# Patient Record
Sex: Male | Born: 1975
Health system: Southern US, Community
[De-identification: ages and names within clinical notes are randomized; demographics above are authoritative.]

## PROBLEM LIST (undated history)

## (undated) DIAGNOSIS — I2510 Atherosclerotic heart disease of native coronary artery without angina pectoris: Secondary | ICD-10-CM

## (undated) DIAGNOSIS — I10 Essential (primary) hypertension: Secondary | ICD-10-CM

## (undated) DIAGNOSIS — F419 Anxiety disorder, unspecified: Secondary | ICD-10-CM

## (undated) DIAGNOSIS — F329 Major depressive disorder, single episode, unspecified: Secondary | ICD-10-CM

## (undated) DIAGNOSIS — E785 Hyperlipidemia, unspecified: Secondary | ICD-10-CM

## (undated) DIAGNOSIS — F32A Depression, unspecified: Secondary | ICD-10-CM

## (undated) DIAGNOSIS — E119 Type 2 diabetes mellitus without complications: Secondary | ICD-10-CM

## (undated) DIAGNOSIS — R011 Cardiac murmur, unspecified: Secondary | ICD-10-CM

## (undated) DIAGNOSIS — E782 Mixed hyperlipidemia: Secondary | ICD-10-CM

## (undated) DIAGNOSIS — I350 Nonrheumatic aortic (valve) stenosis: Secondary | ICD-10-CM

## (undated) DIAGNOSIS — E7800 Pure hypercholesterolemia, unspecified: Secondary | ICD-10-CM

## (undated) DIAGNOSIS — L03032 Cellulitis of left toe: Secondary | ICD-10-CM

## (undated) DIAGNOSIS — R0600 Dyspnea, unspecified: Secondary | ICD-10-CM

## (undated) HISTORY — DX: Mixed hyperlipidemia: E78.2

## (undated) HISTORY — DX: Type 2 diabetes mellitus without complications: E11.9

## (undated) HISTORY — DX: Cellulitis of left toe: L03.032

## (undated) HISTORY — DX: Hyperlipidemia, unspecified: E78.5

## (undated) HISTORY — DX: Anxiety disorder, unspecified: F41.9

## (undated) HISTORY — PX: CARDIAC VALVE REPLACEMENT: SHX585

## (undated) HISTORY — DX: Essential (primary) hypertension: I10

## (undated) HISTORY — DX: Depression, unspecified: F32.A

## (undated) HISTORY — DX: Major depressive disorder, single episode, unspecified: F32.9

---
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
## 2007-01-20 ENCOUNTER — Emergency Department (HOSPITAL_COMMUNITY): Admission: EM | Admit: 2007-01-20 | Discharge: 2007-01-20 | Payer: Self-pay | Admitting: Emergency Medicine

## 2011-05-26 ENCOUNTER — Other Ambulatory Visit: Payer: Self-pay

## 2011-05-26 ENCOUNTER — Emergency Department (HOSPITAL_COMMUNITY)
Admission: EM | Admit: 2011-05-26 | Discharge: 2011-05-26 | Disposition: A | Discharge status: Home / Self Care | Payer: Self-pay | Attending: Emergency Medicine | Admitting: Emergency Medicine

## 2011-05-26 ENCOUNTER — Emergency Department (HOSPITAL_COMMUNITY): Payer: Self-pay

## 2011-05-26 ENCOUNTER — Encounter (HOSPITAL_COMMUNITY): Payer: Self-pay | Admitting: *Deleted

## 2011-05-26 DIAGNOSIS — R12 Heartburn: Secondary | ICD-10-CM | POA: Insufficient documentation

## 2011-05-26 DIAGNOSIS — R109 Unspecified abdominal pain: Secondary | ICD-10-CM | POA: Insufficient documentation

## 2011-05-26 DIAGNOSIS — K920 Hematemesis: Secondary | ICD-10-CM | POA: Insufficient documentation

## 2011-05-26 DIAGNOSIS — M549 Dorsalgia, unspecified: Secondary | ICD-10-CM | POA: Insufficient documentation

## 2011-05-26 DIAGNOSIS — R739 Hyperglycemia, unspecified: Secondary | ICD-10-CM

## 2011-05-26 DIAGNOSIS — E119 Type 2 diabetes mellitus without complications: Secondary | ICD-10-CM | POA: Insufficient documentation

## 2011-05-26 LAB — CBC
Hemoglobin: 14.9 g/dL (ref 13.0–17.0)
MCH: 30 pg (ref 26.0–34.0)
MCHC: 34.5 g/dL (ref 30.0–36.0)
MCV: 86.9 fL (ref 78.0–100.0)
Platelets: 241 10*3/uL (ref 150–400)
RBC: 4.97 MIL/uL (ref 4.22–5.81)

## 2011-05-26 LAB — DIFFERENTIAL
Basophils Relative: 0 % (ref 0–1)
Eosinophils Absolute: 0 10*3/uL (ref 0.0–0.7)
Eosinophils Relative: 0 % (ref 0–5)
Lymphs Abs: 2.1 10*3/uL (ref 0.7–4.0)
Monocytes Relative: 5 % (ref 3–12)

## 2011-05-26 LAB — URINALYSIS, ROUTINE W REFLEX MICROSCOPIC
Ketones, ur: NEGATIVE mg/dL
Leukocytes, UA: NEGATIVE
Nitrite: NEGATIVE
Protein, ur: NEGATIVE mg/dL
Urobilinogen, UA: 0.2 mg/dL (ref 0.0–1.0)

## 2011-05-26 LAB — CARDIAC PANEL(CRET KIN+CKTOT+MB+TROPI): CK, MB: 2.4 ng/mL (ref 0.3–4.0)

## 2011-05-26 LAB — BASIC METABOLIC PANEL
BUN: 10 mg/dL (ref 6–23)
Calcium: 9.2 mg/dL (ref 8.4–10.5)
GFR calc Af Amer: >60 mL/min (ref >60)
GFR calc non Af Amer: >60 mL/min (ref >60)
Glucose, Bld: 234 mg/dL — ABNORMAL HIGH (ref 70–99)

## 2011-05-26 LAB — HEPATIC FUNCTION PANEL
Bilirubin, Direct: 0.1 mg/dL (ref 0.0–0.3)
Indirect Bilirubin: 0.4 mg/dL (ref 0.3–0.9)
Total Bilirubin: 0.5 mg/dL (ref 0.3–1.2)

## 2011-05-26 LAB — LIPASE, BLOOD: Lipase: 23 U/L (ref 11–59)

## 2011-05-26 MED ORDER — FAMOTIDINE 20 MG PO TABS: 20.0000 mg | ORAL_TABLET | Freq: Two times a day (BID) | ORAL | Status: DC

## 2011-05-26 MED ORDER — OMEPRAZOLE 20 MG PO CPDR: DELAYED_RELEASE_CAPSULE | ORAL | Status: DC

## 2011-05-26 MED ORDER — FAMOTIDINE 20 MG PO TABS
20.0000 mg | ORAL_TABLET | Freq: Once | ORAL | Status: AC
  Administered 2011-05-26: 20 mg via ORAL
  Filled 2011-05-26: qty 1

## 2011-05-26 MED ORDER — GI COCKTAIL ~~LOC~~
30.0000 mL | Freq: Once | ORAL | Status: AC
  Administered 2011-05-26: 30 mL via ORAL
  Filled 2011-05-26: qty 30

## 2011-05-26 MED ORDER — HYDROCODONE-ACETAMINOPHEN 5-325 MG PO TABS: 2.0000 | ORAL_TABLET | ORAL | Status: AC | PRN

## 2011-05-26 MED ORDER — SIMETHICONE 80 MG PO CHEW
CHEWABLE_TABLET | ORAL | Status: AC
  Administered 2011-05-26: 160 mg
  Filled 2011-05-26: qty 2

## 2011-05-26 NOTE — Progress Notes (Signed)
Pt states he only received minimal relief from the pepcid and prilosec. Gas X and narco Rx given. Pt ot follow up with his MD This week. He is to return to the [facility] if any emergent changes.

## 2011-05-26 NOTE — Progress Notes (Signed)
Test results reviewed with pt. Pt not aware of being diabetic. STRONGLY suggested he see his MD at [facility] practice this week for additional eval and treatment, and continued work up of the abdominal pain and gas.

## 2011-05-26 NOTE — ED Notes (Signed)
Pt states he was seen here today for same complaint and was sent home with meds for pain and nausea; pt states once he got home he began vomiting red emesis; pt states he is still having pain to left upper quad

## 2011-05-26 NOTE — ED Notes (Signed)
Pt upper left back pain and left upper abd/ under left breast.. States pain is dull pain. Denies sob, periods of nausea. Pain radiates to back as well.

## 2011-05-26 NOTE — ED Provider Notes (Signed)
History     CSN: [id number] Arrival date & time: 05/26/2011 11:27 AM  Chief Complaint  Patient presents with  . Abdominal Pain  . Back Pain   HPI Comments: Pt states he was awaken about 3AM with a dull ache that is in the left upper abd and back area. It is not associated with SOB. No reported sweats. No recent cough/sneeze. No lifting, pushing or pulling. No changes in diet. This pain was accompanied by a bloated feeling. He took Maalox without relief. No previous hx of this type of pain.  Patient is a 35 y.o. male presenting with abdominal pain and back pain. The history is provided by the patient.  Abdominal Pain The primary symptoms of the illness include abdominal pain. The primary symptoms of the illness do not include shortness of breath or dysuria. The current episode started 13 to 24 hours ago. The onset of the illness was sudden. The problem has not changed since onset. The illness is associated with awakening from sleep. Additional symptoms associated with the illness include heartburn and back pain. Symptoms associated with the illness do not include chills, constipation, urgency, hematuria or frequency. Significant associated medical issues include gallstones. Significant associated medical issues do not include PUD, GERD, inflammatory bowel disease, diabetes, liver disease, substance abuse, HIV or cardiac disease.  Back Pain  Associated symptoms include abdominal pain. Pertinent negatives include no chest pain and no dysuria.    History reviewed. No pertinent past medical history.  History reviewed. No pertinent past surgical history.  No family history on file.  History  Substance Use Topics  . Smoking status: Never Smoker   . Smokeless tobacco: Not on file  . Alcohol Use: Yes      Review of Systems  Constitutional: Negative for chills and activity change.       All ROS Neg except as noted in HPI  HENT: Negative for nosebleeds and neck pain.   Eyes: Negative for  photophobia and discharge.  Respiratory: Negative for cough, shortness of breath and wheezing.   Cardiovascular: Negative for chest pain and palpitations.  Gastrointestinal: Positive for heartburn and abdominal pain. Negative for constipation and blood in stool.  Genitourinary: Negative for dysuria, urgency, frequency and hematuria.  Musculoskeletal: Positive for back pain. Negative for arthralgias.  Skin: Negative.   Neurological: Negative for dizziness, seizures and speech difficulty.  Psychiatric/Behavioral: Negative for hallucinations and confusion.    Physical Exam  BP 167/91  Pulse 82  Temp(Src) 98.4 F (36.9 C) (Oral)  Resp 22  Ht 6' (1.829 m)  Wt 419 lb (190.057 kg)  BMI 56.83 kg/m2  SpO2 99%  Physical Exam  Nursing note and vitals reviewed. Constitutional: He is oriented to person, place, and time. He appears well-developed and well-nourished.  Non-toxic appearance.  HENT:  Head: Normocephalic.  Right Ear: Tympanic membrane and external ear normal.  Left Ear: Tympanic membrane and external ear normal.  Eyes: EOM and lids are normal. Pupils are equal, round, and reactive to light.  Neck: Normal range of motion. Neck supple. Carotid bruit is not present.  Cardiovascular: Normal rate, regular rhythm, normal heart sounds, intact distal pulses and normal pulses.   Pulmonary/Chest: Breath sounds normal. No respiratory distress.  Abdominal: Soft. He exhibits no mass. There is tenderness. There is no rebound and no guarding.       Left upper quad tenderness to palpation. Gaseous bowel sounds throughout the abd. No palpable mass.  Musculoskeletal: Normal range of motion. He exhibits no tenderness.  Lymphadenopathy:       Head (right side): No submandibular adenopathy present.       Head (left side): No submandibular adenopathy present.    He has no cervical adenopathy.  Neurological: He is alert and oriented to person, place, and time. He has normal strength. No cranial nerve  deficit or sensory deficit.  Skin: Skin is warm and dry.  Psychiatric: He has a normal mood and affect. His speech is normal.    ED Course  Procedures  MDM I have reviewed nursing notes, vital signs, and all appropriate lab and imaging results for this patient. If cardiac markers and EKG neg for acute problem suspect diabetic gastroparesis.     Date: 05/26/2011  Rate: 79  Rhythm: normal sinus rhythm  QRS Axis: normal  Intervals: normal  ST/T Wave abnormalities: normal  Conduction Disutrbances:none  Narrative Interpretation:   Old EKG Reviewed: none available   Kathie Dike, PA 05/26/11 1500  Kathie Dike, [place] 05/26/11 1500

## 2011-05-27 ENCOUNTER — Ambulatory Visit (HOSPITAL_COMMUNITY): Admission: RE | Admit: 2011-05-27 | Payer: Self-pay | Source: Ambulatory Visit

## 2011-05-27 ENCOUNTER — Emergency Department (HOSPITAL_COMMUNITY)
Admission: EM | Admit: 2011-05-27 | Discharge: 2011-05-27 | Disposition: A | Discharge status: Home / Self Care | Payer: Self-pay | Attending: Emergency Medicine | Admitting: Emergency Medicine

## 2011-05-27 MED ORDER — HYDROMORPHONE HCL 1 MG/ML IJ SOLN
1.0000 mg | Freq: Once | INTRAMUSCULAR | Status: AC
  Administered 2011-05-27: 1 mg via INTRAMUSCULAR
  Filled 2011-05-27: qty 1

## 2011-05-27 MED ORDER — ONDANSETRON HCL 4 MG/2ML IJ SOLN
4.0000 mg | Freq: Once | INTRAMUSCULAR | Status: DC
  Filled 2011-05-27: qty 2

## 2011-05-27 MED ORDER — PROMETHAZINE HCL 25 MG PO TABS: 25.0000 mg | ORAL_TABLET | Freq: Four times a day (QID) | ORAL | Status: AC | PRN

## 2011-05-27 MED ORDER — SODIUM CHLORIDE 0.9 % IV SOLN: Freq: Once | INTRAVENOUS | Status: DC

## 2011-05-27 MED ORDER — HYDROCODONE-ACETAMINOPHEN 5-325 MG PO TABS: 2.0000 | ORAL_TABLET | ORAL | Status: AC | PRN

## 2011-05-27 NOTE — Progress Notes (Signed)
  Medical screening examination/treatment/procedure(s) were performed by non-physician practitioner and as supervising physician I was immediately available for consultation/collaboration.     

## 2011-05-27 NOTE — ED Notes (Signed)
Pt advised edp will be in to see him in the next little bit. No signs of distress noted. No active vomiting at this time,

## 2011-05-27 NOTE — ED Notes (Signed)
Ct called for wt limit on scanner. EDP notified pt is over wt limit for scan. EDP advised she would have to go back & see pt.

## 2011-05-27 NOTE — ED Notes (Signed)
Pt complaining of back pain & epigastric pain. Was seen in the ed yesterday. No relief from meds. Pt also states vomited blood around 1900 yesterday.

## 2011-05-27 NOTE — ED Notes (Signed)
Pt advised tech that his stomach & back were really hurting. EDP notified.

## 2011-05-27 NOTE — ED Provider Notes (Signed)
Medical screening examination/treatment/procedure(s) were performed by non-physician practitioner and as supervising physician I was immediately available for consultation/collaboration.   Vida Roller, MD 05/27/11 [phone]

## 2011-07-27 NOTE — ED Provider Notes (Signed)
History     CSN: [id number] Arrival date & time: 05/27/2011 12:43 AM   None     Chief Complaint  Patient presents with  . Abdominal Pain  . Hematemesis    (Consider location/radiation/quality/duration/timing/severity/associated sxs/prior treatment) HPI Comments: Patient has been seen previously for abdominal pain, vomiting, and nausea. Continued to have nausea and abdominal pain after going home. Patient is morbidly obese. States emesis had a red color to it . Medications given him yesterday did not help with nausea or with pain.   Patient is a 35 y.o. male presenting with abdominal pain.  Abdominal Pain The primary symptoms of the illness include abdominal pain, nausea and vomiting.    History reviewed. No pertinent past medical history.  History reviewed. No pertinent past surgical history.  History reviewed. No pertinent family history.  History  Substance Use Topics  . Smoking status: Never Smoker   . Smokeless tobacco: Not on file  . Alcohol Use: Yes      Review of Systems  Gastrointestinal: Positive for nausea, vomiting and abdominal pain.  All other systems reviewed and are negative.    Allergies  Review of patient's allergies indicates no known allergies.  Home Medications   Current Outpatient Rx  Name Route Sig Dispense Refill  . FAMOTIDINE 20 MG PO TABS Oral Take 1 tablet (20 mg total) by mouth 2 (two) times daily. 40 tablet 0  . IBUPROFEN 200 MG PO TABS Oral Take 800 mg by mouth every 6 (six) hours as needed. Back Pain     . OMEPRAZOLE 20 MG PO CPDR  2 po daily 40 capsule 0    BP 141/87  Pulse 100  Temp(Src) 98.2 F (36.8 C) (Oral)  Resp 20  Ht 6' (1.829 m)  Wt 419 lb (190.057 kg)  BMI 56.83 kg/m2  SpO2 100%  Physical Exam  Nursing note and vitals reviewed. Constitutional: He is oriented to person, place, and time. He appears well-developed and well-nourished. No distress.       Morbidly obese  HENT:  Head: Normocephalic and atraumatic.   Mouth/Throat: Oropharynx is clear and moist.  Eyes: EOM are normal.  Neck: Normal range of motion. Neck supple.  Cardiovascular: Normal rate, normal heart sounds and intact distal pulses.   Pulmonary/Chest: Effort normal and breath sounds normal.  Abdominal: Soft. He exhibits no mass. There is no tenderness. There is no rebound and no guarding.       obese  Musculoskeletal: Normal range of motion.       Chronic skin changes to lower extremities. 3+ brawny edema.  Neurological: He is alert and oriented to person, place, and time.  Skin: Skin is warm and dry.    ED Course  Procedures (including critical care time)  Labs Reviewed - No data to display No results found.   No diagnosis found.    MDM  Moridly obese man here with continued abdominal pain, nausea and vomiting. Unable to obtain CT for further evaluation due to weight limitation.Given antiemetic and narcotic with pain relief. No vomiting while in the ER.        Nicoletta Dress. Colon Branch, MD 07/27/11 [phone]

## 2011-08-31 ENCOUNTER — Emergency Department (HOSPITAL_COMMUNITY)
Admission: EM | Admit: 2011-08-31 | Discharge: 2011-08-31 | Disposition: A | Discharge status: Home / Self Care | Payer: Self-pay | Attending: Emergency Medicine | Admitting: Emergency Medicine

## 2011-08-31 ENCOUNTER — Encounter (HOSPITAL_COMMUNITY): Payer: Self-pay | Admitting: *Deleted

## 2011-08-31 DIAGNOSIS — R739 Hyperglycemia, unspecified: Secondary | ICD-10-CM

## 2011-08-31 DIAGNOSIS — R7309 Other abnormal glucose: Secondary | ICD-10-CM | POA: Insufficient documentation

## 2011-08-31 DIAGNOSIS — Z6841 Body Mass Index (BMI) 40.0 and over, adult: Secondary | ICD-10-CM | POA: Insufficient documentation

## 2011-08-31 HISTORY — DX: Morbid (severe) obesity due to excess calories: E66.01

## 2011-08-31 LAB — URINALYSIS, ROUTINE W REFLEX MICROSCOPIC
Bilirubin Urine: NEGATIVE
Ketones, ur: NEGATIVE mg/dL
Leukocytes, UA: NEGATIVE
Nitrite: NEGATIVE
Protein, ur: NEGATIVE mg/dL
Urobilinogen, UA: 0.2 mg/dL (ref 0.0–1.0)

## 2011-08-31 LAB — POCT I-STAT, CHEM 8
Creatinine, Ser: 0.7 mg/dL (ref 0.50–1.35)
HCT: 53 % — ABNORMAL HIGH (ref 39.0–52.0)
Hemoglobin: 18 g/dL — ABNORMAL HIGH (ref 13.0–17.0)
Potassium: 3.7 mEq/L (ref 3.5–5.1)
Sodium: 133 mEq/L — ABNORMAL LOW (ref 135–145)
TCO2: 26 mmol/L (ref 0–100)

## 2011-08-31 LAB — GLUCOSE, CAPILLARY: Glucose-Capillary: 352 mg/dL — ABNORMAL HIGH (ref 70–99)

## 2011-08-31 LAB — BLOOD GAS, ARTERIAL
Acid-Base Excess: 1 mmol/L (ref 0.0–2.0)
Drawn by: 22874
FIO2: 0.21 %
pCO2 arterial: 40 mmHg (ref 35.0–45.0)
pH, Arterial: 7.414 (ref 7.350–7.450)
pO2, Arterial: 76.3 mmHg — ABNORMAL LOW (ref 80.0–100.0)

## 2011-08-31 LAB — URINE MICROSCOPIC-ADD ON

## 2011-08-31 MED ORDER — SODIUM CHLORIDE 0.9 % IV SOLN: INTRAVENOUS | Status: DC

## 2011-08-31 MED ORDER — SODIUM CHLORIDE 0.9 % IV BOLUS (SEPSIS)
1000.0000 mL | Freq: Once | INTRAVENOUS | Status: AC
  Administered 2011-08-31: 1000 mL via INTRAVENOUS

## 2011-08-31 MED ORDER — METFORMIN HCL 500 MG PO TABS: 500.0000 mg | ORAL_TABLET | Freq: Two times a day (BID) | ORAL | Status: DC

## 2011-08-31 MED ORDER — METFORMIN HCL 500 MG PO TABS
500.0000 mg | ORAL_TABLET | Freq: Once | ORAL | Status: AC
  Administered 2011-08-31: 500 mg via ORAL
  Filled 2011-08-31: qty 1

## 2011-08-31 MED ORDER — SODIUM CHLORIDE 0.9 % IV BOLUS (SEPSIS)
500.0000 mL | Freq: Once | INTRAVENOUS | Status: AC
  Administered 2011-08-31: 500 mL via INTRAVENOUS

## 2011-08-31 NOTE — ED Provider Notes (Signed)
History     CSN: [id number] Arrival date & time: 08/31/2011  2:58 PM   Chief Complaint  Patient presents with  . Dizziness   HPI Pt was seen at 1620.  Per pt, c/o gradual onset and persistence of constant increased thirst and urination, generalized weakness and fatigue x1-2 months, worse over the past several days.  States "my mother's side of the family all has DM" but he has not checked his CBG.  Pt concerned re: possible DM.  Denies CP/SOB, no abd pain, no N/V/D, no fevers, no back pain.    Past Medical History  Diagnosis Date  . Morbid obesity   . Cholelithiasis     History reviewed. No pertinent past surgical history.   History  Substance Use Topics  . Smoking status: Never Smoker   . Smokeless tobacco: Not on file  . Alcohol Use: Yes     occasionally    Review of Systems ROS: Statement: All systems negative except as marked or noted in the HPI; Constitutional: Negative for fever and chills. ; ; Eyes: Negative for eye pain, redness and discharge. ; ; ENMT: Negative for ear pain, hoarseness, nasal congestion, sinus pressure and sore throat. ; ; Cardiovascular: Negative for chest pain, palpitations, diaphoresis, dyspnea and peripheral edema. ; ; Respiratory: Negative for cough, wheezing and stridor. ; ; Gastrointestinal: +increased thrist and urination.  Negative for nausea, vomiting, diarrhea, abdominal pain, blood in stool, hematemesis, jaundice and rectal bleeding. . ; ; Genitourinary: Negative for dysuria, flank pain and hematuria. ; ; Musculoskeletal: Negative for back pain and neck pain. Negative for swelling and trauma.; ; Skin: Negative for pruritus, rash, abrasions, blisters, bruising and skin lesion.; ; Neuro: +generalized weakness and fatigue.  Negative for headache, lightheadedness and neck stiffness. Negative for altered level of consciousness , altered mental status, extremity weakness, paresthesias, involuntary movement, seizure and syncope.     Allergies  Review  of patient's allergies indicates no known allergies.  Home Medications   Current Outpatient Rx  Name Route Sig Dispense Refill  . IBUPROFEN 200 MG PO TABS Oral Take 600 mg by mouth every 6 (six) hours as needed. Back Pain      BP 143/87  Pulse 109  Temp(Src) 98.4 F (36.9 C) (Oral)  Resp 24  Ht 6' (1.829 m)  Wt 419 lb (190.057 kg)  BMI 56.83 kg/m2  SpO2 97%  Physical Exam 1625: Physical examination:  Nursing notes reviewed; Vital signs and O2 SAT reviewed;  Constitutional: Well developed, Well nourished, Well hydrated, In no acute distress; Head:  Normocephalic, atraumatic; Eyes: EOMI, PERRL, No scleral icterus; ENMT: Mouth and pharynx normal, Mucous membranes moist; Neck: Supple, Full range of motion, No lymphadenopathy; Cardiovascular: Regular rate and rhythm, No murmur, rub, or gallop; Respiratory: Breath sounds clear & equal bilaterally, No rales, rhonchi, wheezes, or rub, Normal respiratory effort/excursion; Chest: Nontender, Movement normal; Abdomen: Soft, Nontender, Nondistended, Normal bowel sounds; Extremities: Pulses normal, No tenderness, No edema, No calf edema or asymmetry.; Neuro: AA&Ox3, Major CN grossly intact. Speech clear, no facial droop. No gross focal motor or sensory deficits in extremities.; Skin: Color normal, Warm, Dry, no rash.    ED Course  Procedures    MDM  MDM Reviewed: nursing note and vitals Interpretation: labs   Results for orders placed during the hospital encounter of 08/31/11  GLUCOSE, CAPILLARY      Component Value Range   Glucose-Capillary 352 (*) 70 - 99 (mg/dL)   Comment 1 Notify RN  URINALYSIS, ROUTINE W REFLEX MICROSCOPIC      Component Value Range   Color, Urine YELLOW  YELLOW    APPearance CLEAR  CLEAR    Specific Gravity, Urine 1.010  1.005 - 1.030    pH 6.0  5.0 - 8.0    Glucose, UA >1000 (*) NEGATIVE (mg/dL)   Hgb urine dipstick NEGATIVE  NEGATIVE    Bilirubin Urine NEGATIVE  NEGATIVE    Ketones, ur NEGATIVE  NEGATIVE  (mg/dL)   Protein, ur NEGATIVE  NEGATIVE (mg/dL)   Urobilinogen, UA 0.2  0.0 - 1.0 (mg/dL)   Nitrite NEGATIVE  NEGATIVE    Leukocytes, UA NEGATIVE  NEGATIVE   BLOOD GAS, ARTERIAL      Component Value Range   FIO2 .21     Delivery systems ROOM AIR     pH, Arterial 7.414  7.350 - 7.450    pCO2 arterial 40.0  35.0 - 45.0 (mmHg)   pO2, Arterial 76.3 (*) 80.0 - 100.0 (mmHg)   Bicarbonate 25.1 (*) 20.0 - 24.0 (mEq/L)   TCO2 21.5  0 - 100 (mmol/L)   Acid-Base Excess 1.0  0.0 - 2.0 (mmol/L)   O2 Saturation 95.7     Patient temperature 37.0     Collection site LEFT BRACHIAL     Drawn by [id number]     Sample type ARTERIAL     Allens test (pass/fail) PASS  PASS   POCT I-STAT, CHEM 8      Component Value Range   Sodium 133 (*) 135 - 145 (mEq/L)   Potassium 3.7  3.5 - 5.1 (mEq/L)   Chloride 96  96 - 112 (mEq/L)   BUN 10  6 - 23 (mg/dL)   Creatinine, Ser 1.61  0.50 - 1.35 (mg/dL)   Glucose, Bld 096 (*) 70 - 99 (mg/dL)   Calcium, Ion 0.45  4.09 - 1.32 (mmol/L)   TCO2 26  0 - 100 (mmol/L)   Hemoglobin 18.0 (*) 13.0 - 17.0 (g/dL)   HCT 81.1 (*) 91.4 - 52.0 (%)  URINE MICROSCOPIC-ADD ON      Component Value Range   Squamous Epithelial / LPF RARE  RARE    WBC, UA 0-2  <3 (WBC/hpf)   RBC / HPF 0-2  <3 (RBC/hpf)   Bacteria, UA FEW (*) RARE   GLUCOSE, CAPILLARY      Component Value Range   Glucose-Capillary 295 (*) 70 - 99 (mg/dL)     7829:  Pt obese/Pickwickian, likely cause for lower PO2 (chronic).  Sats 96 -100% R/A, no resp distress.  Denies SOB, no fevers.  Doubt PE, CHF, or pneumonia at this time.  Glu elevated, but not acidotic.  Will give IVF and re-check CBG.  ED RN has spoken with Case Manager and states she will f/u with pt tomorrow re: DM education and obtaining PMD f/u.  Pt aware and agreeable.   7:28 PM:  CBG now 295 after IVF.  Will give dose of metformin while in ED.  Case Manager to contact pt tomorrow.  Pt wants to go home now, feels better.  VS remain stable.  Dx testing d/w  pt.  Questions answered.  Verb understanding, agreeable to d/c home with outpt f/u.      Adrielle Polakowski Allison Quarry, DO 09/02/11 1324

## 2011-08-31 NOTE — ED Notes (Signed)
Per Dr. Clarene Duke- cancel hgb A1C

## 2011-08-31 NOTE — ED Notes (Signed)
IV to Rt FA dc'd catheter intact.

## 2011-08-31 NOTE — ED Notes (Signed)
Spoke with Gonzella Lex case management will contact pt tomorrow to set up appt with health department

## 2011-08-31 NOTE — ED Notes (Addendum)
C/o dizziness, fatigue x 2 days; also c/o increased thirst and increased urination x 1 month.

## 2011-08-31 NOTE — ED Notes (Signed)
resp paged for ABG. Spoke with Britta Mccreedy

## 2011-09-01 LAB — URINE CULTURE

## 2011-09-01 NOTE — Progress Notes (Signed)
CARE MANAGEMENT NOTE 09/01/2011  Patient:  Adam Long, Adam Long   Account Number:  [id number]  Date Initiated:  09/01/2011  Documentation initiated by:  Anibal Henderson  Subjective/Objective Assessment:   ED patient--Pt found to be new onset DM in ED on 08/31/11. He is laid off from job, and is no longer receiving unemployment, so cannot afford MD visits     Action/Plan:   Spoke with pt and he is good with  going to [facility]. Appt made for 09/06/11 0900.  Message left on his cell phone with date, time and  their instructions   Anticipated DC Date:  08/31/2011   Anticipated DC Plan:  HOME/SELF CARE         Choice offered to / List presented to:             Status of service:   Medicare Important Message given?   (If response is "NO", the following Medicare IM given date fields will be blank) Date Medicare IM given:   Date Additional Medicare IM given:    Discharge Disposition:  HOME/SELF CARE  Per UR Regulation:    Comments:  Anibal Henderson RN 1200 09/01/11

## 2011-09-01 NOTE — Progress Notes (Signed)
Spoke with Adam Long by phone and he agrees with an appointment at [facility]. Appointment made for Mon 09/06/11 at 0900. Message left for him with this date and time, and their instructions

## 2011-11-17 ENCOUNTER — Encounter (HOSPITAL_COMMUNITY): Payer: Self-pay | Admitting: *Deleted

## 2011-11-17 ENCOUNTER — Emergency Department (HOSPITAL_COMMUNITY): Payer: Self-pay

## 2011-11-17 ENCOUNTER — Emergency Department (HOSPITAL_COMMUNITY)
Admission: EM | Admit: 2011-11-17 | Discharge: 2011-11-17 | Disposition: A | Discharge status: Home / Self Care | Payer: Self-pay | Attending: Emergency Medicine | Admitting: Emergency Medicine

## 2011-11-17 ENCOUNTER — Other Ambulatory Visit: Payer: Self-pay

## 2011-11-17 DIAGNOSIS — R197 Diarrhea, unspecified: Secondary | ICD-10-CM | POA: Insufficient documentation

## 2011-11-17 DIAGNOSIS — I10 Essential (primary) hypertension: Secondary | ICD-10-CM | POA: Insufficient documentation

## 2011-11-17 DIAGNOSIS — R079 Chest pain, unspecified: Secondary | ICD-10-CM | POA: Insufficient documentation

## 2011-11-17 DIAGNOSIS — Z6841 Body Mass Index (BMI) 40.0 and over, adult: Secondary | ICD-10-CM | POA: Insufficient documentation

## 2011-11-17 DIAGNOSIS — R51 Headache: Secondary | ICD-10-CM | POA: Insufficient documentation

## 2011-11-17 DIAGNOSIS — E119 Type 2 diabetes mellitus without complications: Secondary | ICD-10-CM | POA: Insufficient documentation

## 2011-11-17 DIAGNOSIS — R110 Nausea: Secondary | ICD-10-CM | POA: Insufficient documentation

## 2011-11-17 HISTORY — DX: Essential (primary) hypertension: I10

## 2011-11-17 HISTORY — DX: Pure hypercholesterolemia, unspecified: E78.00

## 2011-11-17 LAB — BASIC METABOLIC PANEL
BUN: 11 mg/dL (ref 6–23)
CO2: 25 mEq/L (ref 19–32)
Calcium: 9.9 mg/dL (ref 8.4–10.5)
Chloride: 100 mEq/L (ref 96–112)
Creatinine, Ser: 0.73 mg/dL (ref 0.50–1.35)

## 2011-11-17 LAB — CBC
HCT: 44.7 % (ref 39.0–52.0)
MCH: 28.7 pg (ref 26.0–34.0)
MCV: 85.6 fL (ref 78.0–100.0)
RBC: 5.22 MIL/uL (ref 4.22–5.81)
RDW: 13.1 % (ref 11.5–15.5)
WBC: 10.9 10*3/uL — ABNORMAL HIGH (ref 4.0–10.5)

## 2011-11-17 MED ORDER — ASPIRIN EC 81 MG PO TBEC: 81.0000 mg | DELAYED_RELEASE_TABLET | Freq: Every day | ORAL | Status: AC

## 2011-11-17 NOTE — ED Provider Notes (Signed)
History   This chart was scribed for Adam Jakes, MD by Clarita Crane. The patient was seen in room APA09/APA09. Patient's care was started at 1144.    CSN: [id number]  Arrival date & time 11/17/11  1144   First MD Initiated Contact with Patient 11/17/11 1154      Chief Complaint  Patient presents with  . Chest Pain    (Consider location/radiation/quality/duration/timing/severity/associated sxs/prior treatment) HPI Adam Long is a 36 y.o. male who presents to the Emergency Department complaining of intermittent chest pain described as sharp and localized to left side of chest onset 3-4 days ago and persistent since with associated nausea, diarrhea and HA. Patient notes episodes of chest pain last 2-3 minutes at a time and then resolved on own. Patient notes experiencing chest pain previously but notes current episodes of chest pain are more frequent and radiate to upper extremity. Denies chest pain currently and associated SOB, vomiting, syncope, abdominal pain, sore throat, nasal congestion, dysuria, swelling of lower extremities, rash. Patient with h/o diabetes, HTN, high cholesterol.  Past Medical History  Diagnosis Date  . Morbid obesity   . Cholelithiasis   . Diabetes mellitus   . Hypertension   . Hypercholesteremia     History reviewed. No pertinent past surgical history.  No family history on file.  History  Substance Use Topics  . Smoking status: Never Smoker   . Smokeless tobacco: Not on file  . Alcohol Use: Yes     occasionally      Review of Systems  Constitutional: Negative for fever and chills.  HENT: Negative for rhinorrhea and neck pain.   Eyes: Negative for pain.  Respiratory: Negative for cough and shortness of breath.   Cardiovascular: Positive for chest pain.  Gastrointestinal: Negative for nausea, vomiting, abdominal pain and diarrhea.  Genitourinary: Negative for dysuria.  Musculoskeletal: Negative for back pain.  Skin: Negative for  rash.  Neurological: Negative for dizziness and weakness.     Allergies  Review of patient's allergies indicates no known allergies.  Home Medications   Current Outpatient Rx  Name Route Sig Dispense Refill  . IBUPROFEN 200 MG PO TABS Oral Take 600 mg by mouth every 6 (six) hours as needed. Back Pain    . METFORMIN HCL 500 MG PO TABS Oral Take 1 tablet (500 mg total) by mouth 2 (two) times daily with a meal. 15 tablet 0    BP 123/84  Pulse 89  Temp 98.3 F (36.8 C)  Resp 24  Ht 5\' 11"  (1.803 m)  Wt 390 lb (176.903 kg)  BMI 54.39 kg/m2  SpO2 100%  Physical Exam  Nursing note and vitals reviewed. Constitutional: He is oriented to person, place, and time. He appears well-developed and well-nourished. No distress.  HENT:  Head: Normocephalic and atraumatic.  Eyes: EOM are normal. Pupils are equal, round, and reactive to light.  Neck: Normal range of motion. Neck supple. No tracheal deviation present.  Cardiovascular: Normal rate and regular rhythm.  Exam reveals no gallop and no friction rub.   No murmur heard. Pulmonary/Chest: Effort normal and breath sounds normal. No respiratory distress. He has no wheezes. He has no rales.  Abdominal: Soft. Bowel sounds are normal. He exhibits no distension. There is no tenderness.  Musculoskeletal: Normal range of motion. He exhibits no edema.  Neurological: He is alert and oriented to person, place, and time. No cranial nerve deficit or sensory deficit.  Skin: Skin is warm and dry.  Psychiatric: He  has a normal mood and affect. His behavior is normal.    ED Course  Procedures (including critical care time)  DIAGNOSTIC STUDIES: Oxygen Saturation is 100% on room air, normal by my interpretation.    COORDINATION OF CARE: 12:23PM- Patient informed of current plan for treatment and evaluation and agrees with plan at this time.     Labs Reviewed  CBC - Abnormal; Notable for the following:    WBC 10.9 (*)    All other components  within normal limits  BASIC METABOLIC PANEL - Abnormal; Notable for the following:    Glucose, Bld 108 (*)    All other components within normal limits  TROPONIN I   Dg Chest 2 View  11/17/2011  *RADIOLOGY REPORT*  Clinical Data: 36 year old male with chest pain.  CHEST - 2 VIEW  Comparison: 05/26/2011.  Findings: Normal lung volumes.  Cardiac size at the upper limits of normal. Other mediastinal contours are within normal limits. Visualized tracheal air column is within normal limits.  The lungs are clear.  No pneumothorax or effusion. No acute osseous abnormality identified.  IMPRESSION: No acute cardiopulmonary abnormality.  Original Report Authenticated By: Harley Hallmark, M.D.    Date: 11/17/2011  Rate: 84  Rhythm: normal sinus rhythm  QRS Axis: normal  Intervals: normal  ST/T Wave abnormalities: normal  Conduction Disutrbances:none  Narrative Interpretation:   Old EKG Reviewed: none available    1. Chest pain       MDM  Workup for chest pain negative in the emergency partner troponin is normal chest x-ray negative. Chest pain is very brief and intermittent not long enough to be consistent with an acute cardiac myocardial infarction. However could possibly be signs of early warning signs. Have patient start a baby aspirin a day enteric-coated and followup with cardiology.      I personally performed the services described in this documentation, which was scribed in my presence. The recorded information has been reviewed and considered.     Adam Jakes, MD 11/17/11 [phone]

## 2011-11-17 NOTE — Discharge Instructions (Signed)
Aspirin and Your Heart Aspirin affects the way your blood clots and helps "thin" the blood. Aspirin has many uses in heart disease. It may be used as a primary prevention to help reduce the risk of heart related events. It also can be used as a secondary measure to prevent more heart attacks or to prevent additional damage from blood clots.  ASPIRIN MAY HELP IF YOU:  Have had a heart attack or chest pain.   Have undergone open heart surgery such as CABG (Coronary Artery Bypass Surgery).   Have had coronary angioplasty with or without stents.   Have experienced a stroke or TIA (transient ischemic attack).   Have peripheral vascular disease (PAD).   Have chronic heart rhythm problems such as atrial fibrillation.   Are at risk for heart disease.  BEFORE STARTING ASPIRIN Before you start taking aspirin, your caregiver will need to review your medical history. Many things will need to be taken into consideration, such as:  Smoking status.   Blood pressure.   Diabetes.   Gender.   Weight.   Cholesterol level.  ASPIRIN DOSES  Aspirin should only be taken on the advice of your caregiver. Talk to your caregiver about how much aspirin you should take. Aspirin comes in different doses such as:   81 mg.   162 mg.   325 mg.   The aspirin dose you take may be affected by many factors, some of which include:   Your current medications, especially if your are taking blood-thinners or anti-platelet medicine.   Liver function.   Heart disease risk.   Age.   Aspirin comes in two forms:   Non-enteric-coated. This type of aspirin does not have a coating and is absorbed faster. Non-enteric coated aspirin is recommended for patients experiencing chest pain symptoms. This type of aspirin also comes in a chewable form.   Enteric-coated. This means the aspirin has a special coating that releases the medicine very slowly. Enteric-coated aspirin causes less stomach upset. This type of  aspirin should not be chewed or crushed.  ASPIRIN SIDE EFFECTS Daily use of aspirin can increase your risk of serious side effects, some of these include:  Increased bleeding. This can range from a cut that does not stop bleeding to more serious problems such as stomach bleeding or bleeding into the brain (Intracerebral bleeding).   Increased bruising.   Stomach upset.   An allergic reaction such as red, itchy skin.   Increased risk of bleeding when combined with non-steroidal anti-inflammatory medicine (NSAIDS).   Alcohol should be drank in moderation when taking aspirin. Alcohol can increase the risk of stomach bleeding when taken with aspirin.   Aspirin should not be given to children less than 18 years of age due to the association of Reye syndrome. Reye syndrome is a serious illness that can affect the brain and liver. Studies have linked Reye syndrome with aspirin use in children.   People that have nasal polyps have an increased risk of developing an aspirin allergy.  SEEK MEDICAL CARE IF:   You develop an allergic reaction such as:   Hives.   Itchy skin.   Swelling of the lips, tongue or face.   You develop stomach pain.   You have unusual bleeding or bruising.   You have ringing in your ears.  SEEK IMMEDIATE MEDICAL CARE IF:   You have severe chest pain, especially if the pain is crushing or pressure-like and spreads to the arms, back, neck, or jaw. THIS   IS AN EMERGENCY. Do not wait to see if the pain will go away. Get medical help at once. Call your local emergency services (911 in the [place] DO NOT drive yourself to the hospital.   You have stroke-like symptoms such as:   Loss of vision.   Difficulty talking.   Numbness or weakness on one side of your body.   Numbness or weakness in your arm or leg.   Not thinking clearly or feeling confused.   Your bowel movements are bloody, dark red or black in color.   You vomit or cough up blood.   You have blood  in your urine.   You have shortness of breath, coughing or wheezing.  MAKE SURE YOU:   Understand these instructions.   Will monitor your condition.   Seek immediate medical care if necessary.  Document Released: 08/26/2008 Document Revised: 05/26/2011 Document Reviewed: 08/26/2008 [facility] Patient Information 2012 [place], [place].Chest Pain (Nonspecific) It is often hard to give a specific diagnosis for the cause of chest pain. There is always a chance that your pain could be related to something serious, such as a heart attack or a blood clot in the lungs. You need to follow up with your caregiver for further evaluation. CAUSES   Heartburn.   Pneumonia or bronchitis.   Anxiety and stress.   Inflammation around your heart (pericarditis) or lung (pleuritis or pleurisy).   A blood clot in the lung.   A collapsed lung (pneumothorax). It can develop suddenly on its own (spontaneous pneumothorax) or from injury (trauma) to the chest.  The chest wall is composed of bones, muscles, and cartilage. Any of these can be the source of the pain.  The bones can be bruised by injury.   The muscles or cartilage can be strained by coughing or overwork.   The cartilage can be affected by inflammation and become sore (costochondritis).  DIAGNOSIS  Lab tests or other studies, such as X-rays, an EKG, stress testing, or cardiac imaging, may be needed to find the cause of your pain.  TREATMENT   Treatment depends on what may be causing your chest pain. Treatment may include:   Acid blockers for heartburn.   Anti-inflammatory medicine.   Pain medicine for inflammatory conditions.   Antibiotics if an infection is present.   You may be advised to change lifestyle habits. This includes stopping smoking and avoiding caffeine and chocolate.   You may be advised to keep your head raised (elevated) when sleeping. This reduces the chance of acid going backward from your stomach into your esophagus.    Most of the time, nonspecific chest pain will improve within 2 to 3 days with rest and mild pain medicine.  HOME CARE INSTRUCTIONS   If antibiotics were prescribed, take the full amount even if you start to feel better.   For the next few days, avoid physical activities that bring on chest pain. Continue physical activities as directed.   Do not smoke cigarettes or drink alcohol until your symptoms are gone.   Only take over-the-counter or prescription medicine for pain, discomfort, or fever as directed by your caregiver.   Follow your caregiver's suggestions for further testing if your chest pain does not go away.   Keep any follow-up appointments you made. If you do not go to an appointment, you could develop lasting (chronic) problems with pain. If there is any problem keeping an appointment, you must call to reschedule.  SEEK MEDICAL CARE IF:   You  think you are having problems from the medicine you are taking. Read your medicine instructions carefully.   Your chest pain does not go away, even after treatment.   You develop a rash with blisters on your chest.  SEEK IMMEDIATE MEDICAL CARE IF:   You have increased chest pain or pain that spreads to your arm, neck, jaw, back, or belly (abdomen).   You develop shortness of breath, an increasing cough, or you are coughing up blood.   You have severe back or abdominal pain, feel sick to your stomach (nauseous) or throw up (vomit).   You develop severe weakness, fainting, or chills.   You have an oral temperature above 102 F (38.9 C), not controlled by medicine.  THIS IS AN EMERGENCY. Do not wait to see if the pain will go away. Get medical help at once. Call your local emergency services (911 in [place] Do not drive yourself to the hospital. MAKE SURE YOU:   Understand these instructions.   Will watch your condition.   Will get help right away if you are not doing well or get worse.  Document Released: 06/23/2005 Document  Revised: 05/26/2011 Document Reviewed: 04/18/2008 [facility] Patient Information 2012 [place], [place].  RESOURCE GUIDE  Dental Problems  Patients with Medicaid: [facility] [phone] [address]                                           [phone] [address] Phone:  [phone]                                                  Phone:  [phone]  If unable to pay or uninsured, contact:  [facility] or [facility]. to become qualified for the adult dental clinic.  Chronic Pain Problems Contact [facility]  [phone] Patients need to be referred by their primary care doctor.  Insufficient Money for Medicine Contact [employer]:  call "211" or [facility] [phone].  No Primary Care Doctor Call [facility]  3310990192 Other agencies that provide inexpensive medical care    [facility]  [phone]    [facility]  [phone]    [facility]  [phone]    [facility]  [phone]    [facility]  [phone]    [facility]  [phone]  Psychological Services [facility]  [phone] [facility]  [phone] [facility] Mental Health   [phone] (emergency services [phone])  Substance Abuse Resources [facility]  [phone] [facility] [phone] [facility] [place] [phone]) [phone] Floydene Flock [phone] Residential & Outpatient Substance Abuse Program  [phone]  Abuse/Neglect [facility] Child Abuse Hotline 713-802-8914 [facility] Child Abuse Hotline (715)427-2755 (After Hours)  Emergency Shelter [facility] [phone]  Maternity Homes Room at the [place] [facility] [place] [phone] [place] Alert Services [phone]  MRSA Hotline #:   551-092-4501    [facility]  [facility] Dept. [address] [place]                       [address]      [address]  Blondell Reveal Phone:  [phone]                                   Phone:  [phone]                 Phone:  [phone]  [facility] Phone:  [phone]  [facility] Child Abuse Hotline 838-842-8329 [phone]) [phone] (After Hours)   Recommend start taking a baby aspirin a day. Call cardiology for followup appointment. Followup with health department or use resource guide to find a primary care provider. Return for chest pain lasting 15 minutes or longer or new or worse symptoms.

## 2011-11-17 NOTE — ED Notes (Signed)
Pt states intermittent CP to left chest x 3-4 days. Described as sharp, lasting 2-3 minutes or for a shorter period at other times. States left arm pain"the other day"/ DENIES CP at this time.

## 2013-07-02 ENCOUNTER — Observation Stay (HOSPITAL_COMMUNITY)
Admission: EM | Admit: 2013-07-02 | Discharge: 2013-07-03 | Disposition: A | Discharge status: Home / Self Care | Payer: Self-pay | Attending: Internal Medicine | Admitting: Internal Medicine

## 2013-07-02 ENCOUNTER — Emergency Department (HOSPITAL_COMMUNITY): Payer: Self-pay

## 2013-07-02 ENCOUNTER — Encounter (HOSPITAL_COMMUNITY): Payer: Self-pay | Admitting: *Deleted

## 2013-07-02 DIAGNOSIS — Z79899 Other long term (current) drug therapy: Secondary | ICD-10-CM | POA: Insufficient documentation

## 2013-07-02 DIAGNOSIS — E7800 Pure hypercholesterolemia, unspecified: Secondary | ICD-10-CM | POA: Insufficient documentation

## 2013-07-02 DIAGNOSIS — E876 Hypokalemia: Secondary | ICD-10-CM | POA: Insufficient documentation

## 2013-07-02 DIAGNOSIS — R079 Chest pain, unspecified: Principal | ICD-10-CM | POA: Insufficient documentation

## 2013-07-02 DIAGNOSIS — E119 Type 2 diabetes mellitus without complications: Secondary | ICD-10-CM | POA: Diagnosis present

## 2013-07-02 DIAGNOSIS — I10 Essential (primary) hypertension: Secondary | ICD-10-CM | POA: Insufficient documentation

## 2013-07-02 DIAGNOSIS — E1165 Type 2 diabetes mellitus with hyperglycemia: Secondary | ICD-10-CM

## 2013-07-02 LAB — CBC WITH DIFFERENTIAL/PLATELET
Eosinophils Relative: 1 % (ref 0–5)
HCT: 46.5 % (ref 39.0–52.0)
Lymphocytes Relative: 22 % (ref 12–46)
Lymphs Abs: 2.1 10*3/uL (ref 0.7–4.0)
MCHC: 35.7 g/dL (ref 30.0–36.0)
MCV: 88.4 fL (ref 78.0–100.0)
Monocytes Absolute: 0.5 10*3/uL (ref 0.1–1.0)
Monocytes Relative: 5 % (ref 3–12)
Platelets: 194 10*3/uL (ref 150–400)
RBC: 5.26 MIL/uL (ref 4.22–5.81)
WBC: 9.5 10*3/uL (ref 4.0–10.5)

## 2013-07-02 LAB — BASIC METABOLIC PANEL
CO2: 28 mEq/L (ref 19–32)
Calcium: 9.7 mg/dL (ref 8.4–10.5)
Creatinine, Ser: 0.63 mg/dL (ref 0.50–1.35)
Glucose, Bld: 351 mg/dL — ABNORMAL HIGH (ref 70–99)
Potassium: 4.2 mEq/L (ref 3.5–5.1)

## 2013-07-02 LAB — GLUCOSE, CAPILLARY: Glucose-Capillary: 218 mg/dL — ABNORMAL HIGH (ref 70–99)

## 2013-07-02 MED ORDER — SODIUM CHLORIDE 0.9 % IV SOLN
INTRAVENOUS | Status: DC
  Administered 2013-07-02 – 2013-07-03 (×2): via INTRAVENOUS

## 2013-07-02 MED ORDER — ENOXAPARIN SODIUM 40 MG/0.4ML ~~LOC~~ SOLN
40.0000 mg | SUBCUTANEOUS | Status: DC
  Administered 2013-07-02: 40 mg via SUBCUTANEOUS
  Filled 2013-07-02: qty 0.4

## 2013-07-02 MED ORDER — ASPIRIN EC 81 MG PO TBEC
81.0000 mg | DELAYED_RELEASE_TABLET | Freq: Every day | ORAL | Status: DC
  Administered 2013-07-03: 81 mg via ORAL
  Filled 2013-07-02: qty 1

## 2013-07-02 MED ORDER — ZOLPIDEM TARTRATE 5 MG PO TABS
5.0000 mg | ORAL_TABLET | Freq: Once | ORAL | Status: AC
  Administered 2013-07-03: 5 mg via ORAL
  Filled 2013-07-02: qty 1

## 2013-07-02 MED ORDER — INSULIN ASPART 100 UNIT/ML ~~LOC~~ SOLN
0.0000 [IU] | Freq: Three times a day (TID) | SUBCUTANEOUS | Status: DC
  Administered 2013-07-02: 3 [IU] via SUBCUTANEOUS
  Administered 2013-07-03: 5 [IU] via SUBCUTANEOUS

## 2013-07-02 MED ORDER — ONDANSETRON HCL 4 MG/2ML IJ SOLN: 4.0000 mg | Freq: Four times a day (QID) | INTRAMUSCULAR | Status: DC | PRN

## 2013-07-02 MED ORDER — ONDANSETRON HCL 4 MG PO TABS: 4.0000 mg | ORAL_TABLET | Freq: Four times a day (QID) | ORAL | Status: DC | PRN

## 2013-07-02 MED ORDER — ACETAMINOPHEN 325 MG PO TABS: 650.0000 mg | ORAL_TABLET | Freq: Four times a day (QID) | ORAL | Status: DC | PRN

## 2013-07-02 MED ORDER — AMLODIPINE BESYLATE 5 MG PO TABS
5.0000 mg | ORAL_TABLET | Freq: Every day | ORAL | Status: DC
  Administered 2013-07-02 – 2013-07-03 (×2): 5 mg via ORAL
  Filled 2013-07-02 (×2): qty 1

## 2013-07-02 MED ORDER — ACETAMINOPHEN 650 MG RE SUPP: 650.0000 mg | Freq: Four times a day (QID) | RECTAL | Status: DC | PRN

## 2013-07-02 MED ORDER — ASPIRIN 81 MG PO CHEW
324.0000 mg | CHEWABLE_TABLET | Freq: Once | ORAL | Status: AC
  Administered 2013-07-02: 324 mg via ORAL
  Filled 2013-07-02: qty 4

## 2013-07-02 MED ORDER — INSULIN ASPART 100 UNIT/ML ~~LOC~~ SOLN
15.0000 [IU] | Freq: Once | SUBCUTANEOUS | Status: AC
  Administered 2013-07-02: 15 [IU] via SUBCUTANEOUS

## 2013-07-02 MED ORDER — SODIUM CHLORIDE 0.9 % IJ SOLN: 3.0000 mL | Freq: Two times a day (BID) | INTRAMUSCULAR | Status: DC

## 2013-07-02 NOTE — ED Provider Notes (Signed)
CSN: [id number]     Arrival date & time 07/02/13  1145 History  This chart was scribed for Gilda Crease, MD by Quintella Reichert, ED scribe.  This patient was seen in room APA11/APA11 and the patient's care was started at 1:07 PM.'   Chief Complaint  Patient presents with  . Chest Pain    The history is provided by the patient. No language interpreter was used.    HPI Comments: Adam Long is a 37 y.o. male with h/o DM, uncontrolled HTN, uncontrolled hypercholesteremia, and morbid obesity who presents to the Emergency Department complaining of 3 days of intermittent episodes of dull left-sided chest pain with associated tachycardia.  Symptoms occur in episodes lasting 5-15 minutes and resolve on their own.  Pt denies any specific precipitating factors and states that the symptoms can come on while he is at rest.  He denies associated SOB, nausea, or diaphoresis.  Pain is not exacerbated by movements or by pressing on his chest.  He notes a prior h/o similar pain occasionally but he has never been evaluated for it.  He has never seen a cardiologist.  Cardiac risk factors include a strong family history including his father who had an MI in his 74s and a paternal uncle who had an MI at age 31.  Pt also is not taking his BP or cholesterol medications.  He uses metformin regularly.  He does not smoke.   Past Medical History  Diagnosis Date  . Morbid obesity   . Cholelithiasis   . Diabetes mellitus   . Hypertension   . Hypercholesteremia     History reviewed. No pertinent past surgical history.   No family history on file.   History  Substance Use Topics  . Smoking status: Never Smoker   . Smokeless tobacco: Not on file  . Alcohol Use: Yes     Comment: occasionally    Review of Systems  Constitutional: Negative for diaphoresis.  Respiratory: Negative for shortness of breath.   Cardiovascular: Positive for chest pain and palpitations.  Gastrointestinal: Negative for  nausea.  All other systems reviewed and are negative.    Allergies  Review of patient's allergies indicates no known allergies.  Home Medications   Current Outpatient Rx  Name  Route  Sig  Dispense  Refill  . ibuprofen (ADVIL,MOTRIN) 200 MG tablet   Oral   Take 600 mg by mouth every 6 (six) hours as needed. Back Pain         . metFORMIN (GLUCOPHAGE) 500 MG tablet   Oral   Take 500 mg by mouth 2 (two) times daily with a meal.          BP 152/93  Pulse 89  Temp(Src) 98.1 F (36.7 C) (Oral)  Resp 18  Ht 5\' 11"  (1.803 m)  Wt 390 lb (176.903 kg)  BMI 54.42 kg/m2  SpO2 98%  Physical Exam  Nursing note and vitals reviewed. Constitutional: He is oriented to person, place, and time. He appears well-developed and well-nourished. No distress.  HENT:  Head: Normocephalic and atraumatic.  Right Ear: Hearing normal.  Left Ear: Hearing normal.  Nose: Nose normal.  Mouth/Throat: Oropharynx is clear and moist and mucous membranes are normal.  Eyes: Conjunctivae and EOM are normal. Pupils are equal, round, and reactive to light.  Neck: Normal range of motion. Neck supple.  Cardiovascular: Normal rate, regular rhythm, S1 normal, S2 normal and normal heart sounds.  Exam reveals no gallop and no friction rub.  No murmur heard. Pulmonary/Chest: Effort normal and breath sounds normal. No respiratory distress. He has no wheezes. He has no rales. He exhibits no tenderness.  Abdominal: Soft. Normal appearance and bowel sounds are normal. There is no hepatosplenomegaly. There is no tenderness. There is no rebound, no guarding, no tenderness at McBurney's point and negative Murphy's sign. No hernia.  Musculoskeletal: Normal range of motion.  Neurological: He is alert and oriented to person, place, and time. He has normal strength. No cranial nerve deficit or sensory deficit. Coordination normal. GCS eye subscore is 4. GCS verbal subscore is 5. GCS motor subscore is 6.  Skin: Skin is warm,  dry and intact. No rash noted. No cyanosis.  Psychiatric: He has a normal mood and affect. His speech is normal and behavior is normal. Thought content normal.    ED Course  Procedures (including critical care time)  EKG:  Date: 07/02/2013  Rate: 90  Rhythm: normal sinus rhythm  QRS Axis: normal  Intervals: normal  ST/T Wave abnormalities: nonspecific ST/T changes  Conduction Disutrbances:none  Narrative Interpretation:   Old EKG Reviewed: unchanged    DIAGNOSTIC STUDIES: Oxygen Saturation is 98% on room air, normal by my interpretation.    COORDINATION OF CARE: 1:12 PM-Discussed treatment plan which includes aspirin, CXR, EKG and labs with pt at bedside and pt agreed to plan.    Labs Review Labs Reviewed  CBC WITH DIFFERENTIAL  BASIC METABOLIC PANEL  TROPONIN I    Imaging Review Dg Chest 2 View  07/02/2013   CLINICAL DATA:  Chest pain.  EXAM: CHEST  2 VIEW  COMPARISON:  Two-view chest 11/17/2011  FINDINGS: The heart size and mediastinal contours are within normal limits. Both lungs are clear. The visualized skeletal structures are unremarkable.  IMPRESSION: No active cardiopulmonary disease.   Electronically Signed   By: Gennette Pac M.D.   On: 07/02/2013 13:03    MDM  Diagnosis: Chest pain  Patient presents to the ER for evaluation of an episode of chest pain. This describes it as a dull pain in the left-sided chest has been occurring intermittently for 3 days. Symptoms do seem somewhat atypical, generally occur at rest and resolve spontaneously, the patient has extensive cardiac risk factors and therefore will require hospitalization for cardiac rule out.   I personally performed the services described in this documentation, which was scribed in my presence. The recorded information has been reviewed and is accurate.    Gilda Crease, MD 07/02/13 1409

## 2013-07-02 NOTE — Progress Notes (Signed)
ED/CM noted patient did not have health insurance and/or PCP listed in the computer.  Patient was given the [place] resource handout with information on the clinics, food pantries, and the handout for new health insurance sign-up.  Patient expressed appreciation for this. 

## 2013-07-02 NOTE — ED Notes (Signed)
Pt states intermittent dull pain to left chest and left arm at times, lasting ~ 15 min. Pain began Friday. States he also notices a rapid heart rate at times. Denies pain at present.

## 2013-07-02 NOTE — H&P (Signed)
[facility] History and Physical  Adam Long YQM:[id number] DOB: 1975-11-20 DOA: 07/02/2013  Referring physician:  PCP: No PCP Per Patient  Specialists:   Chief Complaint: chest pain   HPI: Adam Long is a 37 y.o. male with PMH of HTN, DM presented with non exertional L sided non radiating chest pain. Denies SOB, no nausea, vomiting, no dizziness, but had some palpitations. no fever, no cough    Review of Systems: The patient denies anorexia, fever, weight loss,, vision loss, decreased hearing, hoarseness, syncope, dyspnea on exertion, peripheral edema, balance deficits, hemoptysis, abdominal pain, melena, hematochezia, severe indigestion/heartburn, hematuria, incontinence, genital sores, muscle weakness, suspicious skin lesions, transient blindness, difficulty walking, depression, unusual weight change, abnormal bleeding, enlarged lymph nodes, angioedema, and breast masses.    Past Medical History  Diagnosis Date  . Morbid obesity   . Cholelithiasis   . Diabetes mellitus   . Hypertension   . Hypercholesteremia    History reviewed. No pertinent past surgical history. Social History:  reports that he has never smoked. He does not have any smokeless tobacco history on file. He reports that  drinks alcohol. He reports that he does not use illicit drugs. Yes:  where does patient live--home, ALF, SNF? and with whom if at home? Yes:  Can patient participate in ADLs?  No Known Allergies  No family history on file.  Father MI at 5's  (be sure to complete)  Prior to Admission medications   Medication Sig Start Date End Date Taking? Authorizing Provider  ibuprofen (ADVIL,MOTRIN) 200 MG tablet Take 600 mg by mouth every 6 (six) hours as needed. Back Pain   Yes Historical Provider, MD  metFORMIN (GLUCOPHAGE) 500 MG tablet Take 500 mg by mouth 2 (two) times daily with a meal.   Yes Historical Provider, MD   Physical Exam: Filed Vitals:   07/02/13 1313  BP: 150/79  Pulse:  88  Temp:   Resp: 18     General:  alert  Eyes: EOM-i pERRLA   ENT: no oral ulcers   Neck: supple, no JVD   Cardiovascular: s1 s2 rrr  Respiratory: CTA BL   Abdomen: soft, obese, NT   Skin: no rash   Musculoskeletal: no LE edema   Psychiatric: no hallucinations   Neurologic: CN 2-123 intact   Labs on Admission:  Basic Metabolic Panel:  Recent Labs Lab 07/02/13 1220  NA 129*  K 4.2  CL 90*  CO2 28  GLUCOSE 351*  BUN 7  CREATININE 0.63  CALCIUM 9.7   Liver Function Tests: No results found for this basename: AST, ALT, ALKPHOS, BILITOT, PROT, ALBUMIN,  in the last 168 hours No results found for this basename: LIPASE, AMYLASE,  in the last 168 hours No results found for this basename: AMMONIA,  in the last 168 hours CBC:  Recent Labs Lab 07/02/13 1220  WBC 9.5  NEUTROABS 6.7  HGB 16.6  HCT 46.5  MCV 88.4  PLT 194   Cardiac Enzymes:  Recent Labs Lab 07/02/13 1220  TROPONINI <0.30    BNP (last 3 results) No results found for this basename: PROBNP,  in the last 8760 hours CBG: No results found for this basename: GLUCAP,  in the last 168 hours  Radiological Exams on Admission: Dg Chest 2 View  07/02/2013   CLINICAL DATA:  Chest pain.  EXAM: CHEST  2 VIEW  COMPARISON:  Two-view chest 11/17/2011  FINDINGS: The heart size and mediastinal contours are within normal limits. Both lungs  are clear. The visualized skeletal structures are unremarkable.  IMPRESSION: No active cardiopulmonary disease.   Electronically Signed   By: Gennette Pac M.D.   On: 07/02/2013 13:03    EKG: Independently reviewed. NSR, no acute ST/T changes   Assessment/Plan Active Problems:   Chest pain   HTN (hypertension)   DM (diabetes mellitus)   Adam Long is a 37 y.o. male with PMH of HTN, DM presented with non exertional L sided non radiating chest pain.   1. Chest pain atypical; ECG, intial troponin unremarkable;  -no new episodes of chest pain  -monitor on  tele, serial trop, ECG;   2. DM on metformin; no recent HA1C; recheck HA1C; ISS  3. HTN not on meds;  -start amlodipine;   4. Hypo Na; recheck Na in AM  NO:  if consultant consulted, please document name and whether formally or informally consulted  Code Status: full (must indicate code status--if unknown or must be presumed, indicate so) Family Communication: none at the bedside  (indicate person spoken with, if applicable, with phone number if by telephone) Disposition Plan: home  (indicate anticipated LOS)  Time spent: > 35 minutes   Esperanza Sheets [facility] Pager [phone]  If 7PM-7AM, please contact night-coverage [email or web address] Password [email or web address] 07/02/2013, 2:22 PM

## 2013-07-03 DIAGNOSIS — I10 Essential (primary) hypertension: Secondary | ICD-10-CM

## 2013-07-03 DIAGNOSIS — R079 Chest pain, unspecified: Secondary | ICD-10-CM

## 2013-07-03 DIAGNOSIS — E119 Type 2 diabetes mellitus without complications: Secondary | ICD-10-CM

## 2013-07-03 LAB — GLUCOSE, CAPILLARY
Glucose-Capillary: 281 mg/dL — ABNORMAL HIGH (ref 70–99)
Glucose-Capillary: 330 mg/dL — ABNORMAL HIGH (ref 70–99)

## 2013-07-03 LAB — BASIC METABOLIC PANEL
BUN: 13 mg/dL (ref 6–23)
CO2: 30 mEq/L (ref 19–32)
Chloride: 95 mEq/L — ABNORMAL LOW (ref 96–112)
Creatinine, Ser: 0.81 mg/dL (ref 0.50–1.35)
GFR calc Af Amer: >90 mL/min (ref >90)
GFR calc non Af Amer: >90 mL/min (ref >90)
Potassium: 4 mEq/L (ref 3.5–5.1)

## 2013-07-03 LAB — TROPONIN I: Troponin I: <0.3 ng/mL (ref <0.30)

## 2013-07-03 LAB — HEMOGLOBIN A1C: Hgb A1c MFr Bld: 13.1 % — ABNORMAL HIGH (ref <5.7)

## 2013-07-03 MED ORDER — ACETAMINOPHEN 325 MG PO TABS: 650.0000 mg | ORAL_TABLET | Freq: Four times a day (QID) | ORAL | Status: DC | PRN

## 2013-07-03 MED ORDER — AMLODIPINE BESYLATE 2.5 MG PO TABS: 2.5000 mg | ORAL_TABLET | Freq: Every day | ORAL | Status: DC

## 2013-07-03 MED ORDER — METFORMIN HCL 500 MG PO TABS: 500.0000 mg | ORAL_TABLET | Freq: Two times a day (BID) | ORAL | Status: DC

## 2013-07-03 NOTE — Progress Notes (Signed)
CALLED AND INFORMED THE PATIENT (AT [phone]) ABOUT HA1C RESULTS; RECOMMENDED TO INCREASE METFORMIN TO 1000 MG BID; AND F/U AT PCP OFFICE IN 2-3 WEEKS  Torrell Krutz N

## 2013-07-03 NOTE — Progress Notes (Signed)
Utilization Review Complete  

## 2013-07-03 NOTE — Discharge Summary (Signed)
Physician Discharge Summary  Adam Long:[id number] DOB: 18-Aug-1976 DOA: 07/02/2013  PCP: No PCP Per Patient  Admit date: 07/02/2013 Discharge date: 07/03/2013  Time spent: >35 minutes  minutes  Recommendations for Outpatient Follow-up:   [facility] at [facility] with PCP in 1 week; need to f/u on HA1C, and possible outpatient test test  Discharge Diagnoses:  Active Problems:   Chest pain   HTN (hypertension)   DM (diabetes mellitus)   Discharge Condition: stable   Diet recommendation: heart healthy   Filed Weights   07/02/13 1201  Weight: 176.903 kg (390 lb)    History of present illness:  Adam Long is a 37 y.o. male with PMH of HTN, DM presented with non exertional L sided non radiating chest pain.   Hospital Course:  1. Chest pain atypical; ECG,troponin unremarkable;  -no new episodes of chest pain, no exertional symptoms; recommended to f/u with PCP at [facility] in 1 week   2. DM on metformin; HA1C pend; resume metformin;  -f/u with PCP to evaluated on HA1C and adjust meds if needed  3. HTN not on meds;  -start amlodipine;  4. Hypo Na; improving      Procedures:  CXR (i.e. Studies not automatically included, echos, thoracentesis, etc; not x-rays)  Consultations:  None   Discharge Exam: Filed Vitals:   07/03/13 0519  BP: 147/98  Pulse: 82  Temp: 97.9 F (36.6 C)  Resp: 20    General: alert Cardiovascular: s1s2 rrr Respiratory: cta BL   Discharge Instructions  Discharge Orders   Future Orders Complete By Expires   Diet - low sodium heart healthy  As directed    Discharge instructions  As directed    Comments:     Follow up with at [facility] clinic in 1 week f/u on HA1C;   Increase activity slowly  As directed        Medication List    STOP taking these medications       ibuprofen 200 MG tablet  Commonly known as:  ADVIL,MOTRIN      TAKE these medications       acetaminophen 325 MG tablet  Commonly known as:   TYLENOL  Take 2 tablets (650 mg total) by mouth every 6 (six) hours as needed for pain.     amLODipine 2.5 MG tablet  Commonly known as:  NORVASC  Take 1 tablet (2.5 mg total) by mouth daily.     metFORMIN 500 MG tablet  Commonly known as:  GLUCOPHAGE  Take 1 tablet (500 mg total) by mouth 2 (two) times daily with a meal.       No Known Allergies     Follow-up Information   Follow up with No PCP Per Patient. Schedule an appointment as soon as possible for a visit in 1 week.   Specialty:  General Practice   Contact information:   [address] [place] [address] [phone]        The results of significant diagnostics from this hospitalization (including imaging, microbiology, ancillary and laboratory) are listed below for reference.    Significant Diagnostic Studies: Dg Chest 2 View  07/02/2013   CLINICAL DATA:  Chest pain.  EXAM: CHEST  2 VIEW  COMPARISON:  Two-view chest 11/17/2011  FINDINGS: The heart size and mediastinal contours are within normal limits. Both lungs are clear. The visualized skeletal structures are unremarkable.  IMPRESSION: No active cardiopulmonary disease.   Electronically Signed   By: Thayer Ohm  Mattern M.D.   On: 07/02/2013 13:03    Microbiology: No results found for this or any previous visit (from the past 240 hour(s)).   Labs: Basic Metabolic Panel:  Recent Labs Lab 07/02/13 1220 07/03/13 0512  NA 129* 133*  K 4.2 4.0  CL 90* 95*  CO2 28 30  GLUCOSE 351* 309*  BUN 7 13  CREATININE 0.63 0.81  CALCIUM 9.7 9.1   Liver Function Tests: No results found for this basename: AST, ALT, ALKPHOS, BILITOT, PROT, ALBUMIN,  in the last 168 hours No results found for this basename: LIPASE, AMYLASE,  in the last 168 hours No results found for this basename: AMMONIA,  in the last 168 hours CBC:  Recent Labs Lab 07/02/13 1220  WBC 9.5  NEUTROABS 6.7  HGB 16.6  HCT 46.5  MCV 88.4  PLT 194   Cardiac Enzymes:  Recent Labs Lab  07/02/13 1220 07/02/13 1917 07/03/13 0004  TROPONINI <0.30 <0.30 <0.30   BNP: BNP (last 3 results) No results found for this basename: PROBNP,  in the last 8760 hours CBG:  Recent Labs Lab 07/02/13 1619 07/02/13 2114 07/03/13 0729  GLUCAP 218* 313* 281*       Signed:  Jonette Mate N  [facility] 07/03/2013, 9:14 AM

## 2013-07-03 NOTE — Progress Notes (Signed)
Discharge instructions and prescriptions given, verbalized understanding, out in stable condition ambulatory with staff. 

## 2013-07-11 ENCOUNTER — Ambulatory Visit: Payer: Self-pay | Admitting: Family Medicine

## 2015-06-25 ENCOUNTER — Other Ambulatory Visit: Payer: Self-pay | Admitting: Physician Assistant

## 2015-06-25 LAB — COMPREHENSIVE METABOLIC PANEL
ALT: 61 U/L — ABNORMAL HIGH (ref 9–46)
AST: 34 U/L (ref 10–40)
Albumin: 3.5 g/dL — ABNORMAL LOW (ref 3.6–5.1)
Alkaline Phosphatase: 48 U/L (ref 40–115)
BUN: 9 mg/dL (ref 7–25)
CHLORIDE: 100 mmol/L (ref 98–110)
CO2: 27 mmol/L (ref 20–31)
CREATININE: 0.61 mg/dL (ref 0.60–1.35)
Calcium: 9.1 mg/dL (ref 8.6–10.3)
Glucose, Bld: 246 mg/dL — ABNORMAL HIGH (ref 65–99)
Potassium: 4.6 mmol/L (ref 3.5–5.3)
Sodium: 134 mmol/L — ABNORMAL LOW (ref 135–146)
Total Bilirubin: 0.5 mg/dL (ref 0.2–1.2)
Total Protein: 6.3 g/dL (ref 6.1–8.1)

## 2015-06-25 LAB — LIPID PANEL
Cholesterol: 137 mg/dL (ref 125–200)
HDL: 22 mg/dL — ABNORMAL LOW (ref >=40)
LDL CALC: 78 mg/dL (ref <130)
TRIGLYCERIDES: 183 mg/dL — AB (ref <150)
Total CHOL/HDL Ratio: 6.2 Ratio — ABNORMAL HIGH (ref <=5.0)
VLDL: 37 mg/dL — AB (ref <30)

## 2015-06-26 LAB — HEMOGLOBIN A1C
Hgb A1c MFr Bld: 11.6 % — ABNORMAL HIGH (ref <5.7)
MEAN PLASMA GLUCOSE: 286 mg/dL — AB (ref <117)

## 2015-07-21 ENCOUNTER — Other Ambulatory Visit: Payer: Self-pay | Admitting: Physician Assistant

## 2015-08-13 ENCOUNTER — Ambulatory Visit (HOSPITAL_COMMUNITY)
Admission: RE | Admit: 2015-08-13 | Discharge: 2015-08-13 | Disposition: A | Discharge status: Home / Self Care | Payer: Self-pay | Source: Ambulatory Visit | Attending: Physician Assistant | Admitting: Physician Assistant

## 2015-08-13 ENCOUNTER — Encounter: Payer: Self-pay | Admitting: Physician Assistant

## 2015-08-13 ENCOUNTER — Other Ambulatory Visit: Payer: Self-pay | Admitting: Physician Assistant

## 2015-08-13 ENCOUNTER — Ambulatory Visit: Payer: Self-pay | Admitting: Physician Assistant

## 2015-08-13 VITALS — BP 118/82 | HR 88 | Temp 97.9°F | Wt 386.0 lb

## 2015-08-13 DIAGNOSIS — M79662 Pain in left lower leg: Secondary | ICD-10-CM

## 2015-08-13 DIAGNOSIS — E119 Type 2 diabetes mellitus without complications: Secondary | ICD-10-CM | POA: Insufficient documentation

## 2015-08-13 DIAGNOSIS — I8390 Asymptomatic varicose veins of unspecified lower extremity: Secondary | ICD-10-CM

## 2015-08-13 DIAGNOSIS — M7989 Other specified soft tissue disorders: Secondary | ICD-10-CM

## 2015-08-13 DIAGNOSIS — M79605 Pain in left leg: Secondary | ICD-10-CM | POA: Insufficient documentation

## 2015-08-13 DIAGNOSIS — I10 Essential (primary) hypertension: Secondary | ICD-10-CM | POA: Insufficient documentation

## 2015-08-13 NOTE — Progress Notes (Signed)
   BP 118/82 mmHg  Pulse 88  Temp(Src) 97.9 F (36.6 C)  Wt 386 lb (175.088 kg)  SpO2 98%   Subjective:    Patient ID: Adam Long, male    DOB: 09/14/1976, 39 y.o.   MRN: [id number]  HPI: Adam FlankKenneth R Mance is a 39 y.o. male presenting on 08/13/2015 for Leg Pain   HPI  Chief Complaint  Patient presents with  . Leg Pain    L leg. pt used ibu and was a little helpful     Pt denies injury. Pt states started hurting thursday or friday last week.  He states it is swelled also which began Saturday night.  Relevant past medical, surgical, family and social history reviewed and updated as indicated. Interim medical history since our last visit reviewed. Allergies and medications reviewed and updated.  Review of Systems  Constitutional: Negative for fever, chills, diaphoresis, appetite change, fatigue and unexpected weight change.  HENT: Negative for congestion, dental problem, drooling, ear pain, facial swelling, hearing loss, mouth sores, sneezing, sore throat, trouble swallowing and voice change.   Eyes: Negative for pain, discharge, redness, itching and visual disturbance.  Respiratory: Negative for cough, choking, shortness of breath and wheezing.   Cardiovascular: Positive for leg swelling. Negative for chest pain and palpitations.  Gastrointestinal: Negative for vomiting, abdominal pain, diarrhea, constipation and blood in stool.  Endocrine: Negative for cold intolerance, heat intolerance and polydipsia.  Genitourinary: Negative for dysuria, hematuria and decreased urine volume.  Musculoskeletal: Positive for gait problem. Negative for back pain and arthralgias.  Skin: Negative for rash.  Allergic/Immunologic: Negative for environmental allergies.  Neurological: Negative for seizures, syncope, light-headedness and headaches.  Hematological: Negative for adenopathy.  Psychiatric/Behavioral: Negative for suicidal ideas, dysphoric mood and agitation. The patient is not  nervous/anxious.     Per HPI unless specifically indicated above     Objective:    BP 118/82 mmHg  Pulse 88  Temp(Src) 97.9 F (36.6 C)  Wt 386 lb (175.088 kg)  SpO2 98%  Wt Readings from Last 3 Encounters:  08/13/15 386 lb (175.088 kg)  07/02/13 390 lb (176.903 kg)  11/17/11 390 lb (176.903 kg)    Physical Exam  Constitutional: He is oriented to person, place, and time. He appears well-developed and well-nourished.  HENT:  Head: Normocephalic and atraumatic.  Neck: Neck supple.  Cardiovascular: Normal rate and regular rhythm.   Pulses:      Dorsalis pedis pulses are 2+ on the right side, and 2+ on the left side.  Pulmonary/Chest: Effort normal and breath sounds normal. He has no wheezes.  Abdominal: Soft. Bowel sounds are normal. There is no tenderness.  obese  Musculoskeletal:       Right lower leg: He exhibits no tenderness, no swelling and no edema.       Left lower leg: He exhibits tenderness and swelling.  Varicosities BLE  Lymphadenopathy:    He has no cervical adenopathy.  Neurological: He is alert and oriented to person, place, and time.  Skin: Skin is warm and dry.  Psychiatric: He has a normal mood and affect. His behavior is normal.  Vitals reviewed. RLE 21 ". LLE 22" diameter    Assessment & Plan:   Encounter Diagnoses  Name Primary?  . Calf pain, left Yes  . Pain and swelling of left lower leg     Doppler US to eval for DVT today F/u as scheduled

## 2015-09-03 ENCOUNTER — Other Ambulatory Visit: Payer: Self-pay | Admitting: Physician Assistant

## 2015-10-02 ENCOUNTER — Other Ambulatory Visit: Payer: Self-pay | Admitting: Physician Assistant

## 2015-10-02 LAB — COMPREHENSIVE METABOLIC PANEL
ALK PHOS: 45 U/L (ref 40–115)
ALT: 33 U/L (ref 9–46)
AST: 25 U/L (ref 10–40)
Albumin: 3.5 g/dL — ABNORMAL LOW (ref 3.6–5.1)
BILIRUBIN TOTAL: 0.7 mg/dL (ref 0.2–1.2)
BUN: 10 mg/dL (ref 7–25)
CO2: 25 mmol/L (ref 20–31)
Calcium: 8.9 mg/dL (ref 8.6–10.3)
Chloride: 97 mmol/L — ABNORMAL LOW (ref 98–110)
Creat: 0.67 mg/dL (ref 0.60–1.35)
GLUCOSE: 254 mg/dL — AB (ref 65–99)
POTASSIUM: 4.3 mmol/L (ref 3.5–5.3)
SODIUM: 131 mmol/L — AB (ref 135–146)
Total Protein: 6.4 g/dL (ref 6.1–8.1)

## 2015-10-02 LAB — LIPID PANEL
CHOL/HDL RATIO: 6.3 ratio — AB (ref <=5.0)
Cholesterol: 171 mg/dL (ref 125–200)
HDL: 27 mg/dL — ABNORMAL LOW (ref >=40)
LDL Cholesterol: 94 mg/dL (ref <130)
Triglycerides: 248 mg/dL — ABNORMAL HIGH (ref <150)
VLDL: 50 mg/dL — ABNORMAL HIGH (ref <30)

## 2015-10-03 LAB — HEMOGLOBIN A1C
HEMOGLOBIN A1C: 11.3 % — AB (ref <5.7)
MEAN PLASMA GLUCOSE: 278 mg/dL — AB (ref <117)

## 2015-10-07 ENCOUNTER — Ambulatory Visit: Payer: Self-pay | Admitting: Physician Assistant

## 2015-10-08 ENCOUNTER — Encounter: Payer: Self-pay | Admitting: Physician Assistant

## 2015-10-08 ENCOUNTER — Ambulatory Visit: Payer: Self-pay | Admitting: Physician Assistant

## 2015-10-08 ENCOUNTER — Other Ambulatory Visit: Payer: Self-pay | Admitting: Physician Assistant

## 2015-10-08 VITALS — BP 130/90 | HR 87 | Temp 97.3°F | Ht 69.0 in | Wt 382.0 lb

## 2015-10-08 DIAGNOSIS — E1165 Type 2 diabetes mellitus with hyperglycemia: Secondary | ICD-10-CM

## 2015-10-08 DIAGNOSIS — E785 Hyperlipidemia, unspecified: Secondary | ICD-10-CM

## 2015-10-08 DIAGNOSIS — I10 Essential (primary) hypertension: Secondary | ICD-10-CM

## 2015-10-08 DIAGNOSIS — E119 Type 2 diabetes mellitus without complications: Secondary | ICD-10-CM

## 2015-10-08 DIAGNOSIS — IMO0001 Reserved for inherently not codable concepts without codable children: Secondary | ICD-10-CM

## 2015-10-08 NOTE — Patient Instructions (Addendum)
Add Fish oil 2 capsules every day Continue eating lowfat diet Continue eating diabetic diet EXERCISE REGULARLY Increase lantus to 60 units.  Monitor morning fasting bs and bring log to appointment in one month

## 2015-10-08 NOTE — Progress Notes (Signed)
BP 130/90 mmHg  Pulse 87  Temp(Src) 97.3 F (36.3 C)  Ht 5\' 9"  (1.753 m)  Wt 382 lb (173.274 kg)  BMI 56.39 kg/m2  SpO2 96%   Subjective:    Patient ID: Adam Long, male    DOB: 09-05-1976, 40 y.o.   MRN: [id number]  HPI: Adam Long is a 40 y.o. male presenting on 10/08/2015 for Diabetes   HPI   Pt says he is doing okay.  Says his sugars sometimes go high.  Sometimes 180-200 in the am.  Relevant past medical, surgical, family and social history reviewed and updated as indicated. Interim medical history since our last visit reviewed. Allergies and medications reviewed and updated.  Current outpatient prescriptions:  .  insulin glargine (LANTUS) 100 UNIT/ML injection, Inject 50 Units into the skin at bedtime. , Disp: , Rfl:  .  lisinopril (PRINIVIL,ZESTRIL) 20 MG tablet, TAKE ONE TABLET BY MOUTH ONCE DAILY FOR BLOOD PRESSURE, Disp: 30 tablet, Rfl: 4 .  lovastatin (MEVACOR) 20 MG tablet, TAKE ONE TABLET BY MOUTH AT BEDTIME FOR CHOLESTEROL, Disp: 30 tablet, Rfl: 3 .  sitaGLIPtin-metformin (JANUMET) 50-500 MG tablet, Take 1 tablet by mouth 2 (two) times daily with a meal., Disp: , Rfl:  .  metoprolol (LOPRESSOR) 50 MG tablet, TAKE ONE TABLET BY MOUTH TWICE DAILY FOR BLOOD PRESSURE, Disp: 60 tablet, Rfl: 3  Review of Systems  Constitutional: Negative for fever, chills, diaphoresis, appetite change, fatigue and unexpected weight change.  HENT: Positive for congestion, dental problem and sneezing. Negative for drooling, ear pain, facial swelling, hearing loss, mouth sores, sore throat, trouble swallowing and voice change.   Eyes: Negative for pain, discharge, redness, itching and visual disturbance.  Respiratory: Positive for cough. Negative for choking, shortness of breath and wheezing.   Cardiovascular: Negative for chest pain, palpitations and leg swelling.  Gastrointestinal: Negative for vomiting, abdominal pain, diarrhea, constipation and blood in stool.  Endocrine:  Negative for cold intolerance, heat intolerance and polydipsia.  Genitourinary: Negative for dysuria, hematuria and decreased urine volume.  Musculoskeletal: Negative for back pain, arthralgias and gait problem.  Skin: Negative for rash.  Allergic/Immunologic: Negative for environmental allergies.  Neurological: Negative for seizures, syncope, light-headedness and headaches.  Hematological: Negative for adenopathy.  Psychiatric/Behavioral: Negative for suicidal ideas, dysphoric mood and agitation. The patient is not nervous/anxious.     Per HPI unless specifically indicated above     Objective:    BP 130/90 mmHg  Pulse 87  Temp(Src) 97.3 F (36.3 C)  Ht 5\' 9"  (1.753 m)  Wt 382 lb (173.274 kg)  BMI 56.39 kg/m2  SpO2 96%  Wt Readings from Last 3 Encounters:  10/08/15 382 lb (173.274 kg)  08/13/15 386 lb (175.088 kg)  07/02/13 390 lb (176.903 kg)    Physical Exam  Constitutional: He is oriented to person, place, and time. He appears well-developed and well-nourished.  HENT:  Head: Normocephalic and atraumatic.  Neck: Neck supple.  Cardiovascular: Normal rate and regular rhythm.   Pulmonary/Chest: Effort normal and breath sounds normal. He has no wheezes.  Abdominal: Soft. Bowel sounds are normal. There is no tenderness.  obese  Musculoskeletal: He exhibits no edema.  Lymphadenopathy:    He has no cervical adenopathy.  Neurological: He is alert and oriented to person, place, and time.  Skin: Skin is warm and dry.  Psychiatric: He has a normal mood and affect. His behavior is normal.  Vitals reviewed. foot exam done  Results for orders placed or performed  in visit on 10/02/15  Comprehensive metabolic panel  Result Value Ref Range   Sodium 131 (L) 135 - 146 mmol/L   Potassium 4.3 3.5 - 5.3 mmol/L   Chloride 97 (L) 98 - 110 mmol/L   CO2 25 20 - 31 mmol/L   Glucose, Bld 254 (H) 65 - 99 mg/dL   BUN 10 7 - 25 mg/dL   Creat 1.61 0.96 - 0.45 mg/dL   Total Bilirubin 0.7 0.2  - 1.2 mg/dL   Alkaline Phosphatase 45 40 - 115 U/L   AST 25 10 - 40 U/L   ALT 33 9 - 46 U/L   Total Protein 6.4 6.1 - 8.1 g/dL   Albumin 3.5 (L) 3.6 - 5.1 g/dL   Calcium 8.9 8.6 - 40.9 mg/dL  Lipid panel  Result Value Ref Range   Cholesterol 171 125 - 200 mg/dL   Triglycerides 811 (H) <150 mg/dL   HDL 27 (L) >=91 mg/dL   Total CHOL/HDL Ratio 6.3 (H) <=5.0 Ratio   VLDL 50 (H) <30 mg/dL   LDL Cholesterol 94 <478 mg/dL  Hemoglobin G9F  Result Value Ref Range   Hgb A1c MFr Bld 11.3 (H) <5.7 %   Mean Plasma Glucose 278 (H) <117 mg/dL      Assessment & Plan:   Encounter Diagnoses  Name Primary?  . Type 2 diabetes mellitus without complication, unspecified long term insulin use status (HCC) Yes  . Uncontrolled diabetes mellitus type 2 without complications, unspecified long term insulin use status (HCC)   . Hyperlipidemia   . Morbid obesity, unspecified obesity type (HCC)   . Essential hypertension, benign     -Reviewed labs with pt  -Send urine for microalbumin -pt referred for annual diabetic Eye exam -Add fish oil 2 daily. Eat low fat diet -counseled pt to Exercise regularly -increase inslun to 60 u -F/u with bs log in one month

## 2015-10-12 DIAGNOSIS — I10 Essential (primary) hypertension: Secondary | ICD-10-CM | POA: Insufficient documentation

## 2015-10-12 DIAGNOSIS — E118 Type 2 diabetes mellitus with unspecified complications: Secondary | ICD-10-CM | POA: Insufficient documentation

## 2015-10-12 DIAGNOSIS — E782 Mixed hyperlipidemia: Secondary | ICD-10-CM | POA: Insufficient documentation

## 2015-10-12 DIAGNOSIS — E1169 Type 2 diabetes mellitus with other specified complication: Secondary | ICD-10-CM | POA: Insufficient documentation

## 2015-10-12 DIAGNOSIS — E1165 Type 2 diabetes mellitus with hyperglycemia: Secondary | ICD-10-CM | POA: Insufficient documentation

## 2015-10-12 DIAGNOSIS — E785 Hyperlipidemia, unspecified: Secondary | ICD-10-CM | POA: Insufficient documentation

## 2015-11-12 ENCOUNTER — Encounter: Payer: Self-pay | Admitting: Physician Assistant

## 2015-11-12 ENCOUNTER — Ambulatory Visit: Payer: Self-pay | Admitting: Physician Assistant

## 2015-11-12 VITALS — BP 112/82 | HR 87 | Temp 97.9°F | Ht 69.0 in | Wt 386.6 lb

## 2015-11-12 DIAGNOSIS — E1165 Type 2 diabetes mellitus with hyperglycemia: Principal | ICD-10-CM

## 2015-11-12 DIAGNOSIS — IMO0001 Reserved for inherently not codable concepts without codable children: Secondary | ICD-10-CM

## 2015-11-12 NOTE — Progress Notes (Signed)
BP 112/82 mmHg  Pulse 87  Temp(Src) 97.9 F (36.6 C)  Ht  (1.753 m)  Wt 386 lb 9.6 oz (175.361 kg)  BMI 57.06 kg/m2  SpO2 97%   Subjective:    Patient ID: Adam Long, male    DOB: 1976-03-11, 40 y.o.   MRN: [id number]  HPI: Adam Long is a 40 y.o. male presenting on 11/12/2015 for Diabetes   HPI   Pt is feeling fine today. Reviewed bs log. Pt states a few of the highs are from what he ate.  Overall, it has improved with the increase in his lantus  Relevant past medical, surgical, family and social history reviewed and updated as indicated. Interim medical history since our last visit reviewed. Allergies and medications reviewed and updated.   Current outpatient prescriptions:  .  insulin glargine (LANTUS) 100 UNIT/ML injection, Inject 60 Units into the skin at bedtime. , Disp: , Rfl:  .  lisinopril (PRINIVIL,ZESTRIL) 20 MG tablet, TAKE ONE TABLET BY MOUTH ONCE DAILY FOR BLOOD PRESSURE, Disp: 30 tablet, Rfl: 4 .  lovastatin (MEVACOR) 20 MG tablet, TAKE ONE TABLET BY MOUTH AT BEDTIME FOR CHOLESTEROL, Disp: 30 tablet, Rfl: 3 .  metoprolol (LOPRESSOR) 50 MG tablet, TAKE ONE TABLET BY MOUTH TWICE DAILY FOR BLOOD PRESSURE, Disp: 60 tablet, Rfl: 3 .  Omega-3 Fatty Acids (FISH OIL) 1000 MG CAPS, Take 2 capsules by mouth daily., Disp: , Rfl:  .  sitaGLIPtin-metformin (JANUMET) 50-500 MG tablet, Take 1 tablet by mouth 2 (two) times daily with a meal., Disp: , Rfl:    Review of Systems  Constitutional: Positive for fatigue. Negative for fever, chills, diaphoresis, appetite change and unexpected weight change.  HENT: Positive for dental problem. Negative for congestion, drooling, ear pain, facial swelling, hearing loss, mouth sores, sneezing, sore throat, trouble swallowing and voice change.   Eyes: Negative for pain, discharge, redness, itching and visual disturbance.  Respiratory: Positive for cough. Negative for choking, shortness of breath and wheezing.    Cardiovascular: Negative for chest pain, palpitations and leg swelling.  Gastrointestinal: Negative for vomiting, abdominal pain, diarrhea, constipation and blood in stool.  Endocrine: Negative for cold intolerance, heat intolerance and polydipsia.  Genitourinary: Negative for dysuria, hematuria and decreased urine volume.  Musculoskeletal: Negative for back pain, arthralgias and gait problem.  Skin: Negative for rash.  Allergic/Immunologic: Negative for environmental allergies.  Neurological: Negative for seizures, syncope, light-headedness and headaches.  Hematological: Negative for adenopathy.  Psychiatric/Behavioral: Positive for dysphoric mood and agitation. Negative for suicidal ideas. The patient is nervous/anxious.     Per HPI unless specifically indicated above     Objective:    BP 112/82 mmHg  Pulse 87  Temp(Src) 97.9 F (36.6 C)  Ht  (1.753 m)  Wt 386 lb 9.6 oz (175.361 kg)  BMI 57.06 kg/m2  SpO2 97%  Wt Readings from Last 3 Encounters:  11/12/15 386 lb 9.6 oz (175.361 kg)  10/08/15 382 lb (173.274 kg)  08/13/15 386 lb (175.088 kg)    Physical Exam  Constitutional: He is oriented to person, place, and time. He appears well-developed and well-nourished.  HENT:  Head: Normocephalic and atraumatic.  Neck: Neck supple.  Cardiovascular: Normal rate and regular rhythm.   Pulmonary/Chest: Effort normal and breath sounds normal. He has no wheezes.  Lymphadenopathy:    He has no cervical adenopathy.  Neurological: He is alert and oriented to person, place, and time.  Skin: Skin is warm and dry.  Psychiatric: He  has a normal mood and affect. His behavior is normal.  Vitals reviewed.       Assessment & Plan:   Encounter Diagnosis  Name Primary?  Marland Kitchen Uncontrolled diabetes mellitus type 2 without complications, unspecified long term insulin use status (HCC) Yes    -Increase to 62 u lantus -Will resend urine for microalbumin (entered incorrectly at previous  OV) -Watch diet -pt on list for dm eye exam -f/u 2 months. RTO sooner prn

## 2015-11-25 ENCOUNTER — Encounter: Payer: Self-pay | Admitting: Physician Assistant

## 2015-11-25 ENCOUNTER — Ambulatory Visit: Payer: Self-pay | Admitting: Physician Assistant

## 2015-11-25 VITALS — BP 144/78 | HR 87 | Temp 97.7°F | Ht 69.0 in | Wt 382.0 lb

## 2015-11-25 DIAGNOSIS — L02212 Cutaneous abscess of back [any part, except buttock]: Secondary | ICD-10-CM

## 2015-11-25 MED ORDER — SULFAMETHOXAZOLE-TRIMETHOPRIM 800-160 MG PO TABS: 1.0000 | ORAL_TABLET | Freq: Two times a day (BID) | ORAL | Status: DC

## 2015-11-25 NOTE — Progress Notes (Signed)
   BP 144/78 mmHg  Pulse 87  Temp(Src) 97.7 F (36.5 C)  Ht  (1.753 m)  Wt 382 lb (173.274 kg)  BMI 56.39 kg/m2  SpO2 98%   Subjective:    Patient ID: Adam Long, male    DOB: 1976/01/10, 40 y.o.   MRN: [id number]  HPI: Adam Long is a 40 y.o. male presenting on 11/25/2015 for Cyst   HPI Chief Complaint  Patient presents with  . Cyst    mid back.   Pt states abscess for about 4 days.  Painful. Notes d/c on his shirt.  Pt states no previous abscess.    Relevant past medical, surgical, family and social history reviewed and updated as indicated. Interim medical history since our last visit reviewed. Allergies and medications reviewed and updated.  Review of Systems  Constitutional: Negative for fever, chills, diaphoresis, appetite change, fatigue and unexpected weight change.  HENT: Positive for congestion, dental problem and sneezing. Negative for drooling, ear pain, facial swelling, hearing loss, mouth sores, sore throat, trouble swallowing and voice change.   Eyes: Negative for pain, discharge, redness, itching and visual disturbance.  Respiratory: Positive for cough. Negative for choking, shortness of breath and wheezing.   Cardiovascular: Negative for chest pain, palpitations and leg swelling.  Gastrointestinal: Negative for vomiting, abdominal pain, diarrhea, constipation and blood in stool.  Endocrine: Negative for cold intolerance, heat intolerance and polydipsia.  Genitourinary: Negative for dysuria, hematuria and decreased urine volume.  Musculoskeletal: Positive for back pain. Negative for arthralgias and gait problem.  Skin: Negative for rash.  Allergic/Immunologic: Positive for environmental allergies.  Neurological: Negative for seizures, syncope, light-headedness and headaches.  Hematological: Negative for adenopathy.  Psychiatric/Behavioral: Positive for dysphoric mood. Negative for suicidal ideas and agitation. The patient is nervous/anxious.      Per HPI unless specifically indicated above     Objective:    BP 144/78 mmHg  Pulse 87  Temp(Src) 97.7 F (36.5 C)  Ht  (1.753 m)  Wt 382 lb (173.274 kg)  BMI 56.39 kg/m2  SpO2 98%  Wt Readings from Last 3 Encounters:  11/25/15 382 lb (173.274 kg)  11/12/15 386 lb 9.6 oz (175.361 kg)  10/08/15 382 lb (173.274 kg)    Physical Exam Discussed procedure including risks and benefits with pt and he agrees to procedure Pt signed informed consent form for I&D  Area on back draped in sterile fashion, cleaned with hibiclins. Instilled 2% lidocaine. Opened with #11 blade. Small pus expressed. Area cleaned and dry sterile dressing applied.  Pt tolerated well and ambulated from room without assistance  .   Assessment & Plan:   Encounter Diagnosis  Name Primary?  . Cutaneous abscess of back excluding buttocks Yes   rx septra.  Pt to apply warm compresses.  RTO if worsens or persists.

## 2015-11-25 NOTE — Patient Instructions (Signed)
APPLY WARM COMPRESSES TO THE BOIL  Abscess An abscess is an infected area that contains a collection of pus and debris.It can occur in almost any part of the body. An abscess is also known as a furuncle or boil. CAUSES  An abscess occurs when tissue gets infected. This can occur from blockage of oil or sweat glands, infection of hair follicles, or a minor injury to the skin. As the body tries to fight the infection, pus collects in the area and creates pressure under the skin. This pressure causes pain. People with weakened immune systems have difficulty fighting infections and get certain abscesses more often.  SYMPTOMS Usually an abscess develops on the skin and becomes a painful mass that is red, warm, and tender. If the abscess forms under the skin, you may feel a moveable soft area under the skin. Some abscesses break open (rupture) on their own, but most will continue to get worse without care. The infection can spread deeper into the body and eventually into the bloodstream, causing you to feel ill.  DIAGNOSIS  Your caregiver will take your medical history and perform a physical exam. A sample of fluid may also be taken from the abscess to determine what is causing your infection. TREATMENT  Your caregiver may prescribe antibiotic medicines to fight the infection. However, taking antibiotics alone usually does not cure an abscess. Your caregiver may need to make a small cut (incision) in the abscess to drain the pus. In some cases, gauze is packed into the abscess to reduce pain and to continue draining the area. HOME CARE INSTRUCTIONS   Only take over-the-counter or prescription medicines for pain, discomfort, or fever as directed by your caregiver.  If you were prescribed antibiotics, take them as directed. Finish them even if you start to feel better.  If gauze is used, follow your caregiver's directions for changing the gauze.  To avoid spreading the infection:  Keep your draining  abscess covered with a bandage.  Wash your hands well.  Do not share personal care items, towels, or whirlpools with others.  Avoid skin contact with others.  Keep your skin and clothes clean around the abscess.  Keep all follow-up appointments as directed by your caregiver. SEEK MEDICAL CARE IF:   You have increased pain, swelling, redness, fluid drainage, or bleeding.  You have muscle aches, chills, or a general ill feeling.  You have a fever. MAKE SURE YOU:   Understand these instructions.  Will watch your condition.  Will get help right away if you are not doing well or get worse.   This information is not intended to replace advice given to you by your health care provider. Make sure you discuss any questions you have with your health care provider.   Document Released: 06/23/2005 Document Revised: 03/14/2012 Document Reviewed: 11/26/2011 Elsevier Interactive Patient Education [employer]

## 2015-12-08 ENCOUNTER — Emergency Department (HOSPITAL_COMMUNITY)
Admission: EM | Admit: 2015-12-08 | Discharge: 2015-12-08 | Disposition: A | Discharge status: Home / Self Care | Payer: Self-pay | Attending: Emergency Medicine | Admitting: Emergency Medicine

## 2015-12-08 ENCOUNTER — Encounter (HOSPITAL_COMMUNITY): Payer: Self-pay | Admitting: Emergency Medicine

## 2015-12-08 DIAGNOSIS — Z79899 Other long term (current) drug therapy: Secondary | ICD-10-CM | POA: Insufficient documentation

## 2015-12-08 DIAGNOSIS — L02212 Cutaneous abscess of back [any part, except buttock]: Secondary | ICD-10-CM | POA: Insufficient documentation

## 2015-12-08 DIAGNOSIS — I10 Essential (primary) hypertension: Secondary | ICD-10-CM | POA: Insufficient documentation

## 2015-12-08 DIAGNOSIS — F329 Major depressive disorder, single episode, unspecified: Secondary | ICD-10-CM | POA: Insufficient documentation

## 2015-12-08 DIAGNOSIS — R6883 Chills (without fever): Secondary | ICD-10-CM | POA: Insufficient documentation

## 2015-12-08 DIAGNOSIS — E119 Type 2 diabetes mellitus without complications: Secondary | ICD-10-CM | POA: Insufficient documentation

## 2015-12-08 DIAGNOSIS — Z794 Long term (current) use of insulin: Secondary | ICD-10-CM | POA: Insufficient documentation

## 2015-12-08 DIAGNOSIS — E669 Obesity, unspecified: Secondary | ICD-10-CM | POA: Insufficient documentation

## 2015-12-08 DIAGNOSIS — E7800 Pure hypercholesterolemia, unspecified: Secondary | ICD-10-CM | POA: Insufficient documentation

## 2015-12-08 MED ORDER — HYDROCODONE-ACETAMINOPHEN 5-325 MG PO TABS: 1.0000 | ORAL_TABLET | ORAL | Status: DC | PRN

## 2015-12-08 MED ORDER — CLINDAMYCIN HCL 150 MG PO CAPS
300.0000 mg | ORAL_CAPSULE | Freq: Once | ORAL | Status: AC
  Administered 2015-12-08: 300 mg via ORAL
  Filled 2015-12-08: qty 2

## 2015-12-08 MED ORDER — CLINDAMYCIN HCL 150 MG PO CAPS: ORAL_CAPSULE | ORAL | Status: DC

## 2015-12-08 MED ORDER — ONDANSETRON HCL 4 MG PO TABS
4.0000 mg | ORAL_TABLET | Freq: Once | ORAL | Status: AC
  Administered 2015-12-08: 4 mg via ORAL
  Filled 2015-12-08: qty 1

## 2015-12-08 MED ORDER — CEFTRIAXONE SODIUM 1 G IJ SOLR
1.0000 g | Freq: Once | INTRAMUSCULAR | Status: AC
  Administered 2015-12-08: 1 g via INTRAMUSCULAR
  Filled 2015-12-08: qty 10

## 2015-12-08 MED ORDER — LIDOCAINE HCL (PF) 1 % IJ SOLN
INTRAMUSCULAR | Status: AC
  Filled 2015-12-08: qty 5

## 2015-12-08 MED ORDER — LIDOCAINE-EPINEPHRINE (PF) 2 %-1:200000 IJ SOLN
10.0000 mL | Freq: Once | INTRAMUSCULAR | Status: AC
  Administered 2015-12-08: 10 mL
  Filled 2015-12-08: qty 20

## 2015-12-08 NOTE — ED Notes (Signed)
Pt c/o abscess to mid back. Pt states [facility] and finished a course of po bactrim on Thursday.

## 2015-12-08 NOTE — ED Provider Notes (Signed)
CSN: [id number]     Arrival date & time 12/08/15  1046 History  By signing my name below, I, Lyndel Safe, attest that this documentation has been prepared under the direction and in the presence of Ivery Quale, PA-C. Electronically Signed: Lyndel Safe, ED Scribe. 12/08/2015. 11:33 AM.   Chief Complaint  Patient presents with  . Abscess   Patient is a 40 y.o. male presenting with abscess. The history is provided by the patient. No language interpreter was used.  Abscess Location:  Torso Torso abscess location:  Upper back Size:  5 cm  Abscess quality: draining, painful and redness   Red streaking: no   Duration:  4 days Progression:  Worsening Chronicity:  New Context: diabetes   Relieved by:  Oral antibiotics and draining/squeezing Ineffective treatments:  Draining/squeezing and oral antibiotics Associated symptoms: no fever   Risk factors: prior abscess    HPI Comments: Adam Long is a 40 y.o. male, with a h/o morbid obesity, and DM, who presents to the Emergency Department complaining of a recurrent abscess to midline thoracic back that originated 2 weeks ago, gradually improved after I&D procedure and oral antibiotic course, but began to worsen in erythema, size and tenderness 4 days ago. The pt was seen in his PCP office 2 weeks ago when the abscess was lanced and he was prescribed a 7 day course of bactrim/septra, which he was compliant with. He associates intermittent drainage from the abscess and an episode of chills that occurred last night, but he denies any fevers. Pt is a diabetic and notes his CBG has been ranging from 160-180 over the past week, which is his baseline.  He has no known allergies to anesthetics.    Past Medical History  Diagnosis Date  . Morbid obesity (HCC)   . Cholelithiasis   . Diabetes mellitus   . Hypertension   . Hypercholesteremia   . Anxiety   . Depression    History reviewed. No pertinent past surgical history. Family History   Problem Relation Age of Onset  . Cancer Mother     brain  . Heart disease Father   . Hyperlipidemia Father   . Hypertension Father   . Stroke Father    Social History  Substance Use Topics  . Smoking status: Never Smoker   . Smokeless tobacco: Former Neurosurgeon    Types: Chew  . Alcohol Use: Yes     Comment: occasionally    Review of Systems  Constitutional: Positive for chills. Negative for fever.  Skin: Positive for wound ( recurrent abscess to mid upper back).  All other systems reviewed and are negative.  Allergies  Review of patient's allergies indicates no known allergies.  Home Medications   Prior to Admission medications   Medication Sig Start Date End Date Taking? Authorizing Provider  insulin glargine (LANTUS) 100 UNIT/ML injection Inject 62 Units into the skin at bedtime.    Yes Historical Provider, MD  lisinopril (PRINIVIL,ZESTRIL) 20 MG tablet TAKE ONE TABLET BY MOUTH ONCE DAILY FOR BLOOD PRESSURE 07/21/15  Yes Jacquelin Hawking, PA-C  lovastatin (MEVACOR) 20 MG tablet TAKE ONE TABLET BY MOUTH AT BEDTIME FOR CHOLESTEROL 09/06/15  Yes Jacquelin Hawking, PA-C  metoprolol (LOPRESSOR) 50 MG tablet TAKE ONE TABLET BY MOUTH TWICE DAILY FOR BLOOD PRESSURE 10/09/15  Yes Jacquelin Hawking, PA-C  Omega-3 Fatty Acids (FISH OIL) 1000 MG CAPS Take 2 capsules by mouth daily.   Yes Historical Provider, MD  sitaGLIPtin-metformin (JANUMET) 50-500 MG tablet Take 1 tablet  by mouth 2 (two) times daily with a meal.   Yes Historical Provider, MD  clindamycin (CLEOCIN) 150 MG capsule 2 po bid with food 12/08/15   Ivery QualeHobson Kiyon Fidalgo, PA-C  HYDROcodone-acetaminophen (NORCO/VICODIN) 5-325 MG tablet Take 1-2 tablets by mouth every 4 (four) hours as needed. 12/08/15   Ivery QualeHobson Abrey Bradway, PA-C   BP 120/80 mmHg  Pulse 88  Temp(Src) 99 F (37.2 C)  Resp 18  Ht 5\' 10"  (1.778 m)  Wt 384 lb (174.181 kg)  BMI 55.10 kg/m2  SpO2 100% Physical Exam  Constitutional: He is oriented to person, place, and time. He  appears well-developed and well-nourished. No distress.  HENT:  Head: Normocephalic.  Eyes: Conjunctivae are normal.  Neck: Normal range of motion. Neck supple.  Cardiovascular: Normal rate.   Pulmonary/Chest: Effort normal. No respiratory distress.  Musculoskeletal: Normal range of motion.  Neurological: He is alert and oriented to person, place, and time. Coordination normal.  Skin: Skin is warm.  5 cm erythematic, raised, tender abscess of the mid back, no red streaks, no active drainage at this time; multiple moles present but no satellite abscess area.   Psychiatric: He has a normal mood and affect. His behavior is normal.  Nursing note and vitals reviewed.   ED Course  Procedures  DIAGNOSTIC STUDIES: Oxygen Saturation is 100% on RA, normal by my interpretation.    COORDINATION OF CARE: 11:08 AM Discussed treatment plan with pt at bedside which includes to perform an I&D procedure. Pt agreed to plan.  INCISION AND DRAINAGE PROCEDURE NOTE: Patient identification was confirmed and verbal consent was obtained. This procedure was performed by Ivery QualeHobson Karla Pavone, PA-C at 11:26 AM. Site: mid back Sterile procedures observed Anesthetic used: lidocaine 2% with epinephrine 4ml. Blade size: 11 Drainage: copious purulent  Site anesthetized, incision made over site, wound drained and explored loculations, rinsed with copious amounts of normal saline, wound covered with dry, sterile dressing. Culture pending.  Pt tolerated procedure well without complications.   Instructions for care discussed verbally and pt provided with additional written instructions for homecare and f/u.  Labs Review Labs Reviewed  CULTURE, ROUTINE-ABSCESS   I have personally reviewed and evaluated these lab results as part of my medical decision-making.    MDM  Vital signs reviewed. Patient is a diabetic, and antibiotics ordered for the patient. Culture sent to the lab. Patient also given medication for pain.  Discussed with the patient the need to return if any signs of advancing infection.    Final diagnoses:  Abscess of back   **I have reviewed nursing notes, vital signs, and all appropriate lab and imaging results for this patient.* Patient with skin abscess. Incision and drainage performed in the ED today.  Abscess was not large enough to warrant packing or drain placement. Supportive care and return precautions discussed.  Pt sent home with a clindamycin course. The patient appears reasonably screened and/or stabilized for discharge and I doubt any other emergent medical condition requiring further screening, evaluation, or treatment in the ED prior to discharge.   **I personally performed the services described in this documentation, which was scribed in my presence. The recorded information has been reviewed and is accurate.Ivery Quale*   Rockney Grenz, PA-C 12/08/15 2044  Eber HongBrian Miller, MD 12/09/15 [phone]

## 2015-12-08 NOTE — ED Notes (Signed)
Pt with abscess to back that was I& D 'd  Over a week ago and has finished antibiotic.  Continued pain and drainage per pt.. Pt states that he felt like he had a fever last night, denies N/V

## 2015-12-08 NOTE — Discharge Instructions (Signed)
Starting tomorrow night, please use warm tub soaks to the back for 15min daily until the wound has healed completely. Use clindamycin 2 times daily. Use tylenol for mild pain. Use norco for more severe pain. Incision and Drainage Incision and drainage is a procedure in which a sac-like structure (cystic structure) is opened and drained. The area to be drained usually contains material such as pus, fluid, or blood.  LET YOUR CAREGIVER KNOW ABOUT:   Allergies to medicine.  Medicines taken, including vitamins, herbs, eyedrops, over-the-counter medicines, and creams.  Use of steroids (by mouth or creams).  Previous problems with anesthetics or numbing medicines.  History of bleeding problems or blood clots.  Previous surgery.  Other health problems, including diabetes and kidney problems.  Possibility of pregnancy, if this applies. RISKS AND COMPLICATIONS  Pain.  Bleeding.  Scarring.  Infection. BEFORE THE PROCEDURE  You may need to have an ultrasound or other imaging tests to see how large or deep your cystic structure is. Blood tests may also be used to determine if you have an infection or how severe the infection is. You may need to have a tetanus shot. PROCEDURE  The affected area is cleaned with a cleaning fluid. The cyst area will then be numbed with a medicine (local anesthetic). A small incision will be made in the cystic structure. A syringe or catheter may be used to drain the contents of the cystic structure, or the contents may be squeezed out. The area will then be flushed with a cleansing solution. After cleansing the area, it is often gently packed with a gauze or another wound dressing. Once it is packed, it will be covered with gauze and tape or some other type of wound dressing. AFTER THE PROCEDURE   Often, you will be allowed to go home right after the procedure.  You may be given antibiotic medicine to prevent or heal an infection.  If the area was packed with  gauze or some other wound dressing, you will likely need to come back in 1 to 2 days to get it removed.  The area should heal in about 14 days.   This information is not intended to replace advice given to you by your health care provider. Make sure you discuss any questions you have with your health care provider.   Document Released: 03/09/2001 Document Revised: 03/14/2012 Document Reviewed: 11/08/2011 Elsevier Interactive Patient Education [employer]2016 [employer] Inc[employer]

## 2015-12-08 NOTE — ED Notes (Signed)
Pt verbalized understanding of no driving and to use caution within 4 hours of taking pain meds due to meds cause drowsiness.  Instructed pt to take all of antibiotics as prescribed. 

## 2015-12-09 ENCOUNTER — Ambulatory Visit: Payer: Self-pay | Admitting: Physician Assistant

## 2015-12-11 LAB — CULTURE, ROUTINE-ABSCESS: CULTURE: NORMAL

## 2015-12-29 ENCOUNTER — Other Ambulatory Visit: Payer: Self-pay

## 2015-12-29 DIAGNOSIS — E1165 Type 2 diabetes mellitus with hyperglycemia: Principal | ICD-10-CM

## 2015-12-29 DIAGNOSIS — IMO0001 Reserved for inherently not codable concepts without codable children: Secondary | ICD-10-CM

## 2016-01-02 LAB — HEMOGLOBIN A1C
HEMOGLOBIN A1C: 10 % — AB (ref <5.7)
Mean Plasma Glucose: 240 mg/dL

## 2016-01-07 ENCOUNTER — Ambulatory Visit: Payer: Self-pay | Admitting: Physician Assistant

## 2016-01-14 ENCOUNTER — Ambulatory Visit: Payer: Self-pay | Admitting: Physician Assistant

## 2016-01-14 ENCOUNTER — Encounter: Payer: Self-pay | Admitting: Physician Assistant

## 2016-01-14 VITALS — BP 122/82 | HR 82 | Temp 97.7°F | Ht 69.0 in | Wt 379.0 lb

## 2016-01-14 DIAGNOSIS — IMO0001 Reserved for inherently not codable concepts without codable children: Secondary | ICD-10-CM

## 2016-01-14 DIAGNOSIS — I10 Essential (primary) hypertension: Secondary | ICD-10-CM

## 2016-01-14 DIAGNOSIS — E1165 Type 2 diabetes mellitus with hyperglycemia: Principal | ICD-10-CM

## 2016-01-14 NOTE — Progress Notes (Signed)
BP 122/82 mmHg  Pulse 82  Temp(Src) 97.7 F (36.5 C)  Ht  (1.753 m)  Wt 379 lb (171.913 kg)  BMI 55.94 kg/m2  SpO2 98%   Subjective:    Patient ID: Adam Long, male    DOB: [birth year], 40 y.o.   MRN: [id number]  HPI: Adam Long is a 40 y.o. male presenting on 01/14/2016 for Diabetes   HPI   Pt out of his janumet since Sunday.  He expects to be getting it in the mail soon.  Pt states am fbs around 190.  Doing well- no complaints   Relevant past medical, surgical, family and social history reviewed and updated as indicated. Interim medical history since our last visit reviewed. Allergies and medications reviewed and updated.   Current outpatient prescriptions:  .  insulin glargine (LANTUS) 100 UNIT/ML injection, Inject 62 Units into the skin at bedtime. , Disp: , Rfl:  .  lisinopril (PRINIVIL,ZESTRIL) 20 MG tablet, TAKE ONE TABLET BY MOUTH ONCE DAILY FOR BLOOD PRESSURE, Disp: 30 tablet, Rfl: 4 .  lovastatin (MEVACOR) 20 MG tablet, TAKE ONE TABLET BY MOUTH AT BEDTIME FOR CHOLESTEROL, Disp: 30 tablet, Rfl: 3 .  metoprolol (LOPRESSOR) 50 MG tablet, TAKE ONE TABLET BY MOUTH TWICE DAILY FOR BLOOD PRESSURE, Disp: 60 tablet, Rfl: 3 .  Omega-3 Fatty Acids (FISH OIL) 1000 MG CAPS, Take 2 capsules by mouth daily., Disp: , Rfl:  .  sitaGLIPtin-metformin (JANUMET) 50-500 MG tablet, Take 1 tablet by mouth 2 (two) times daily with a meal. Reported on 01/14/2016, Disp: , Rfl:   Review of Systems  Constitutional: Negative for fever, chills, diaphoresis, appetite change, fatigue and unexpected weight change.  HENT: Positive for dental problem and sneezing. Negative for congestion, drooling, ear pain, facial swelling, hearing loss, mouth sores, sore throat, trouble swallowing and voice change.   Eyes: Positive for redness and itching. Negative for pain, discharge and visual disturbance.  Respiratory: Positive for cough. Negative for choking, shortness of breath and wheezing.    Cardiovascular: Negative for chest pain, palpitations and leg swelling.  Gastrointestinal: Negative for vomiting, abdominal pain, diarrhea, constipation and blood in stool.  Endocrine: Negative for cold intolerance, heat intolerance and polydipsia.  Genitourinary: Negative for dysuria, hematuria and decreased urine volume.  Musculoskeletal: Negative for back pain, arthralgias and gait problem.  Skin: Negative for rash.  Allergic/Immunologic: Negative for environmental allergies.  Neurological: Negative for seizures, syncope, light-headedness and headaches.  Hematological: Negative for adenopathy.  Psychiatric/Behavioral: Positive for dysphoric mood. Negative for suicidal ideas and agitation. The patient is not nervous/anxious.        Per HPI unless specifically indicated above     Objective:    BP 122/82 mmHg  Pulse 82  Temp(Src) 97.7 F (36.5 C)  Ht  (1.753 m)  Wt 379 lb (171.913 kg)  BMI 55.94 kg/m2  SpO2 98%  Wt Readings from Last 3 Encounters:  01/14/16 379 lb (171.913 kg)  12/08/15 384 lb (174.181 kg)  11/25/15 382 lb (173.274 kg)    Physical Exam  Constitutional: He is oriented to person, place, and time. He appears well-developed and well-nourished.  HENT:  Head: Normocephalic and atraumatic.  Neck: Neck supple.  Cardiovascular: Normal rate and regular rhythm.   Pulmonary/Chest: Effort normal and breath sounds normal. He has no wheezes.  Abdominal: Soft. Bowel sounds are normal. There is no hepatosplenomegaly. There is no tenderness.  Musculoskeletal: He exhibits no edema.  Lymphadenopathy:    He has no cervical  adenopathy.  Neurological: He is alert and oriented to person, place, and time.  Skin: Skin is warm and dry.  Psychiatric: He has a normal mood and affect. His behavior is normal.  Vitals reviewed.   Results for orders placed or performed in visit on 12/29/15  HgB A1c  Result Value Ref Range   Hgb A1c MFr Bld 10.0 (H) <5.7 %   Mean Plasma  Glucose 240 mg/dL      Assessment & Plan:   Encounter Diagnoses  Name Primary?  Marland Kitchen. Uncontrolled diabetes mellitus type 2 without complications, unspecified long term insulin use status (HCC) Yes  . Essential hypertension   . Morbid obesity, unspecified obesity type (HCC)     -Reviewed labs with pt.  a1c is much improved but long way to goal of 7. -Pt on list for eye exam -Increase lantus by 1u each night that am fbs > 120 (max 75 units) -F/u with bs log 1 month -will check with lab for microalbumin result

## 2016-02-04 ENCOUNTER — Other Ambulatory Visit: Payer: Self-pay | Admitting: Physician Assistant

## 2016-02-12 ENCOUNTER — Ambulatory Visit: Payer: Self-pay | Admitting: Physician Assistant

## 2016-02-12 ENCOUNTER — Encounter: Payer: Self-pay | Admitting: Physician Assistant

## 2016-02-12 VITALS — BP 118/76 | HR 82 | Temp 97.5°F | Ht 69.0 in | Wt 385.0 lb

## 2016-02-12 DIAGNOSIS — I10 Essential (primary) hypertension: Secondary | ICD-10-CM

## 2016-02-12 DIAGNOSIS — IMO0001 Reserved for inherently not codable concepts without codable children: Secondary | ICD-10-CM

## 2016-02-12 DIAGNOSIS — R197 Diarrhea, unspecified: Secondary | ICD-10-CM

## 2016-02-12 DIAGNOSIS — E785 Hyperlipidemia, unspecified: Secondary | ICD-10-CM

## 2016-02-12 DIAGNOSIS — E1165 Type 2 diabetes mellitus with hyperglycemia: Secondary | ICD-10-CM

## 2016-02-12 NOTE — Progress Notes (Signed)
BP 118/76 mmHg  Pulse 82  Temp(Src) 97.5 F (36.4 C)  Ht 5\' 9"  (1.753 m)  Wt 385 lb (174.635 kg)  BMI 56.83 kg/m2  SpO2 96%   Subjective:    Patient ID: Adam Long, male    DOB: 09-13-1976, 40 y.o.   MRN: [id number]  HPI: Adam Long is a 40 y.o. male presenting on 02/12/2016 for Diabetes   HPI   Pt feels like his stomach "hasn't been right" since he took the clindamycin (given for abscess on back 2 months ago).  Pt c/o diarrhea.  He says some days 6-7 episodes of diarrhea.  bs log reviewed- looks great with past 2 wk am fbs 116-129  Relevant past medical, surgical, family and social history reviewed and updated as indicated. Interim medical history since our last visit reviewed.  Allergies and medications reviewed and updated.  Current outpatient prescriptions:  .  insulin glargine (LANTUS) 100 UNIT/ML injection, Inject 75 Units into the skin at bedtime. , Disp: , Rfl:  .  lisinopril (PRINIVIL,ZESTRIL) 20 MG tablet, TAKE ONE TABLET BY MOUTH ONCE DAILY FOR BLOOD PRESSURE, Disp: 30 tablet, Rfl: 3 .  lovastatin (MEVACOR) 20 MG tablet, TAKE ONE TABLET BY MOUTH AT BEDTIME FOR CHOLESTEROL, Disp: 30 tablet, Rfl: 3 .  metoprolol (LOPRESSOR) 50 MG tablet, TAKE ONE TABLET BY MOUTH TWICE DAILY FOR BLOOD PRESSURE, Disp: 60 tablet, Rfl: 3 .  Omega-3 Fatty Acids (FISH OIL) 1000 MG CAPS, Take 2 capsules by mouth daily., Disp: , Rfl:  .  sitaGLIPtin-metformin (JANUMET) 50-500 MG tablet, Take 1 tablet by mouth 2 (two) times daily with a meal. Reported on 01/14/2016, Disp: , Rfl:    Review of Systems  Constitutional: Negative for fever, chills, diaphoresis, appetite change, fatigue and unexpected weight change.  HENT: Positive for dental problem. Negative for congestion, drooling, ear pain, facial swelling, hearing loss, mouth sores, sneezing, sore throat, trouble swallowing and voice change.   Eyes: Negative for pain, discharge, redness, itching and visual disturbance.  Respiratory:  Negative for cough, choking, shortness of breath and wheezing.   Cardiovascular: Negative for chest pain, palpitations and leg swelling.  Gastrointestinal: Positive for diarrhea. Negative for vomiting, abdominal pain, constipation and blood in stool.  Endocrine: Negative for cold intolerance, heat intolerance and polydipsia.  Genitourinary: Negative for dysuria, hematuria and decreased urine volume.  Musculoskeletal: Positive for arthralgias. Negative for back pain and gait problem.  Skin: Negative for rash.  Allergic/Immunologic: Positive for environmental allergies.  Neurological: Negative for seizures, syncope, light-headedness and headaches.  Hematological: Negative for adenopathy.  Psychiatric/Behavioral: Positive for dysphoric mood. Negative for suicidal ideas and agitation. The patient is nervous/anxious.     Per HPI unless specifically indicated above     Objective:    BP 118/76 mmHg  Pulse 82  Temp(Src) 97.5 F (36.4 C)  Ht 5\' 9"  (1.753 m)  Wt 385 lb (174.635 kg)  BMI 56.83 kg/m2  SpO2 96%  Wt Readings from Last 3 Encounters:  02/12/16 385 lb (174.635 kg)  01/14/16 379 lb (171.913 kg)  12/08/15 384 lb (174.181 kg)    Physical Exam  Constitutional: He is oriented to person, place, and time. He appears well-developed and well-nourished.  HENT:  Head: Normocephalic and atraumatic.  Neck: Neck supple.  Cardiovascular: Normal rate and regular rhythm.   Pulmonary/Chest: Effort normal and breath sounds normal. He has no wheezes.  Abdominal: Soft. Bowel sounds are normal. There is no hepatosplenomegaly. There is no tenderness.  Musculoskeletal: He exhibits  no edema.  Lymphadenopathy:    He has no cervical adenopathy.  Neurological: He is alert and oriented to person, place, and time.  Skin: Skin is warm and dry.  Psychiatric: He has a normal mood and affect. His behavior is normal.  Vitals reviewed.       Assessment & Plan:   Encounter Diagnoses  Name Primary?   . Diarrhea, unspecified type Yes  . Uncontrolled diabetes mellitus type 2 without complications, unspecified Long term insulin use status (HCC)   . Essential hypertension, benign   . Morbid obesity, unspecified obesity type (HCC)   . Hyperlipidemia     -check for c dif as cause of diarrhea.  Pt given container to turn in to the lab -Continue lantus 75u qhs.  Gave sample janumet to last until his supply from [name] arrives.  Continue to watch diabetic diet -f/u 2 months.  RTO sooner prn

## 2016-02-17 ENCOUNTER — Other Ambulatory Visit: Payer: Self-pay | Admitting: Physician Assistant

## 2016-02-18 LAB — C. DIFFICILE GDH AND TOXIN A/B
C. DIFFICILE GDH: DETECTED — AB
C. difficile Toxin A/B: NOT DETECTED

## 2016-02-18 LAB — CLOSTRIDIUM DIFFICILE BY PCR: Toxigenic C. Difficile by PCR: DETECTED — CR

## 2016-02-19 ENCOUNTER — Other Ambulatory Visit: Payer: Self-pay | Admitting: Physician Assistant

## 2016-02-19 MED ORDER — METRONIDAZOLE 500 MG PO TABS: 500.0000 mg | ORAL_TABLET | Freq: Three times a day (TID) | ORAL | Status: AC

## 2016-03-10 ENCOUNTER — Other Ambulatory Visit: Payer: Self-pay | Admitting: Physician Assistant

## 2016-04-05 ENCOUNTER — Other Ambulatory Visit: Payer: Self-pay

## 2016-04-05 DIAGNOSIS — E785 Hyperlipidemia, unspecified: Secondary | ICD-10-CM

## 2016-04-05 DIAGNOSIS — I10 Essential (primary) hypertension: Secondary | ICD-10-CM

## 2016-04-05 DIAGNOSIS — IMO0001 Reserved for inherently not codable concepts without codable children: Secondary | ICD-10-CM

## 2016-04-05 DIAGNOSIS — E1165 Type 2 diabetes mellitus with hyperglycemia: Principal | ICD-10-CM

## 2016-04-09 LAB — COMPLETE METABOLIC PANEL WITH GFR
ALT: 28 U/L (ref 9–46)
AST: 21 U/L (ref 10–40)
Albumin: 3.7 g/dL (ref 3.6–5.1)
Alkaline Phosphatase: 45 U/L (ref 40–115)
BUN: 7 mg/dL (ref 7–25)
CALCIUM: 8.8 mg/dL (ref 8.6–10.3)
CHLORIDE: 98 mmol/L (ref 98–110)
CO2: 22 mmol/L (ref 20–31)
Creat: 0.69 mg/dL (ref 0.60–1.35)
GFR, Est Non African American: >89 mL/min (ref >=60)
Glucose, Bld: 219 mg/dL — ABNORMAL HIGH (ref 65–99)
POTASSIUM: 4.5 mmol/L (ref 3.5–5.3)
Sodium: 134 mmol/L — ABNORMAL LOW (ref 135–146)
Total Bilirubin: 0.8 mg/dL (ref 0.2–1.2)
Total Protein: 6.3 g/dL (ref 6.1–8.1)

## 2016-04-09 LAB — LIPID PANEL
CHOL/HDL RATIO: 4.7 ratio (ref <=5.0)
CHOLESTEROL: 164 mg/dL (ref 125–200)
HDL: 35 mg/dL — ABNORMAL LOW (ref >=40)
LDL Cholesterol: 82 mg/dL (ref <130)
TRIGLYCERIDES: 233 mg/dL — AB (ref <150)
VLDL: 47 mg/dL — AB (ref <30)

## 2016-04-09 LAB — HEMOGLOBIN A1C
Hgb A1c MFr Bld: 10 % — ABNORMAL HIGH (ref <5.7)
Mean Plasma Glucose: 240 mg/dL

## 2016-04-13 ENCOUNTER — Ambulatory Visit: Payer: Self-pay | Admitting: Physician Assistant

## 2016-04-13 ENCOUNTER — Encounter: Payer: Self-pay | Admitting: Physician Assistant

## 2016-04-13 VITALS — BP 124/80 | HR 90 | Temp 97.9°F | Ht 69.0 in | Wt 384.0 lb

## 2016-04-13 DIAGNOSIS — E785 Hyperlipidemia, unspecified: Secondary | ICD-10-CM

## 2016-04-13 DIAGNOSIS — E1165 Type 2 diabetes mellitus with hyperglycemia: Principal | ICD-10-CM

## 2016-04-13 DIAGNOSIS — I10 Essential (primary) hypertension: Secondary | ICD-10-CM

## 2016-04-13 DIAGNOSIS — IMO0001 Reserved for inherently not codable concepts without codable children: Secondary | ICD-10-CM

## 2016-04-13 NOTE — Progress Notes (Signed)
BP 124/80 mmHg  Pulse 90  Temp(Src) 97.9 F (36.6 C)  Ht 5\' 9"  (1.753 m)  Wt 384 lb (174.181 kg)  BMI 56.68 kg/m2  SpO2 98%   Subjective:    Patient ID: Adam Long, male    DOB: 07/31/1976, 40 y.o.   MRN: [id number]  HPI: Adam Long is a 40 y.o. male presenting on 04/13/2016 for Diabetes   HPI  Pt states diarrhea resolved.  Pt did not bring his bs logs in today.  States they've been runnin 150-200 this  Relevant past medical, surgical, family and social history reviewed and updated as indicated. Interim medical history since our last visit reviewed. Allergies and medications reviewed and updated.   Current outpatient prescriptions:  .  insulin glargine (LANTUS) 100 UNIT/ML injection, Inject 75 Units into the skin at bedtime. , Disp: , Rfl:  .  lisinopril (PRINIVIL,ZESTRIL) 20 MG tablet, TAKE ONE TABLET BY MOUTH ONCE DAILY FOR BLOOD PRESSURE, Disp: 30 tablet, Rfl: 3 .  lovastatin (MEVACOR) 20 MG tablet, TAKE ONE TABLET BY MOUTH AT BEDTIME FOR CHOLESTEROL, Disp: 30 tablet, Rfl: 3 .  metoprolol (LOPRESSOR) 50 MG tablet, TAKE ONE TABLET BY MOUTH TWICE DAILY FOR BLOOD PRESSURE, Disp: 60 tablet, Rfl: 2 .  Omega-3 Fatty Acids (FISH OIL) 1000 MG CAPS, Take 2 capsules by mouth daily., Disp: , Rfl:  .  sitaGLIPtin-metformin (JANUMET) 50-500 MG tablet, Take 1 tablet by mouth 2 (two) times daily with a meal. Reported on 01/14/2016, Disp: , Rfl:   Review of Systems  Constitutional: Negative for fever, chills, diaphoresis, appetite change, fatigue and unexpected weight change.  HENT: Positive for dental problem. Negative for congestion, drooling, ear pain, facial swelling, hearing loss, mouth sores, sneezing, sore throat, trouble swallowing and voice change.   Eyes: Negative for pain, discharge, redness, itching and visual disturbance.  Respiratory: Negative for cough, choking, shortness of breath and wheezing.   Cardiovascular: Negative for chest pain, palpitations and leg  swelling.  Gastrointestinal: Negative for vomiting, abdominal pain, diarrhea, constipation and blood in stool.  Endocrine: Negative for cold intolerance, heat intolerance and polydipsia.  Genitourinary: Negative for dysuria, hematuria and decreased urine volume.  Musculoskeletal: Negative for back pain, arthralgias and gait problem.  Skin: Negative for rash.  Allergic/Immunologic: Negative for environmental allergies.  Neurological: Negative for seizures, syncope, light-headedness and headaches.  Hematological: Negative for adenopathy.  Psychiatric/Behavioral: Positive for dysphoric mood. Negative for suicidal ideas and agitation. The patient is nervous/anxious.     Per HPI unless specifically indicated above     Objective:    BP 124/80 mmHg  Pulse 90  Temp(Src) 97.9 F (36.6 C)  Ht 5\' 9"  (1.753 m)  Wt 384 lb (174.181 kg)  BMI 56.68 kg/m2  SpO2 98%  Wt Readings from Last 3 Encounters:  04/13/16 384 lb (174.181 kg)  02/12/16 385 lb (174.635 kg)  01/14/16 379 lb (171.913 kg)    Physical Exam  Constitutional: He is oriented to person, place, and time. He appears well-developed and well-nourished.  HENT:  Head: Normocephalic and atraumatic.  Neck: Neck supple.  Cardiovascular: Normal rate and regular rhythm.   Pulmonary/Chest: Effort normal and breath sounds normal. He has no wheezes.  Abdominal: Soft. Bowel sounds are normal. There is no hepatosplenomegaly. There is no tenderness.  Musculoskeletal: He exhibits no edema.  Lymphadenopathy:    He has no cervical adenopathy.  Neurological: He is alert and oriented to person, place, and time.  Skin: Skin is warm and dry.  Psychiatric: He has a normal mood and affect. His behavior is normal.  Vitals reviewed.   Results for orders placed or performed in visit on 04/05/16  COMPLETE METABOLIC PANEL WITH GFR  Result Value Ref Range   Sodium 134 (L) 135 - 146 mmol/L   Potassium 4.5 3.5 - 5.3 mmol/L   Chloride 98 98 - 110 mmol/L    CO2 22 20 - 31 mmol/L   Glucose, Bld 219 (H) 65 - 99 mg/dL   BUN 7 7 - 25 mg/dL   Creat 4.09 8.11 - 9.14 mg/dL   Total Bilirubin 0.8 0.2 - 1.2 mg/dL   Alkaline Phosphatase 45 40 - 115 U/L   AST 21 10 - 40 U/L   ALT 28 9 - 46 U/L   Total Protein 6.3 6.1 - 8.1 g/dL   Albumin 3.7 3.6 - 5.1 g/dL   Calcium 8.8 8.6 - 78.2 mg/dL   GFR, Est African American >89 >=60 mL/min   GFR, Est Non African American >89 >=60 mL/min  Lipid Profile  Result Value Ref Range   Cholesterol 164 125 - 200 mg/dL   Triglycerides 956 (H) <150 mg/dL   HDL 35 (L) >=21 mg/dL   Total CHOL/HDL Ratio 4.7 <=5.0 Ratio   VLDL 47 (H) <30 mg/dL   LDL Cholesterol 82 <308 mg/dL  HgB M5H  Result Value Ref Range   Hgb A1c MFr Bld 10.0 (H) <5.7 %   Mean Plasma Glucose 240 mg/dL      Assessment & Plan:   Encounter Diagnoses  Name Primary?  Marland Kitchen Uncontrolled diabetes mellitus type 2 without complications, unspecified long term insulin use status (HCC) Yes  . Essential hypertension, benign   . Hyperlipidemia   . Morbid obesity, unspecified obesity type (HCC)      -refer for annual Dm eye exam -Increase lantus to 82 units. F/u with bs log 6 wk -discussed with pt that If his a1c isn't improved in 3 months will refer to endocrinologist.  Pt agrees -continue current meds

## 2016-04-27 ENCOUNTER — Other Ambulatory Visit: Payer: Self-pay | Admitting: Physician Assistant

## 2016-05-19 ENCOUNTER — Other Ambulatory Visit: Payer: Self-pay | Admitting: Physician Assistant

## 2016-05-25 ENCOUNTER — Encounter: Payer: Self-pay | Admitting: Physician Assistant

## 2016-05-25 ENCOUNTER — Ambulatory Visit: Payer: Self-pay | Admitting: Physician Assistant

## 2016-05-25 VITALS — BP 108/72 | HR 91 | Temp 97.9°F | Ht 69.0 in | Wt 389.5 lb

## 2016-05-25 DIAGNOSIS — IMO0001 Reserved for inherently not codable concepts without codable children: Secondary | ICD-10-CM

## 2016-05-25 DIAGNOSIS — E1165 Type 2 diabetes mellitus with hyperglycemia: Principal | ICD-10-CM

## 2016-05-25 DIAGNOSIS — I10 Essential (primary) hypertension: Secondary | ICD-10-CM

## 2016-05-25 DIAGNOSIS — E785 Hyperlipidemia, unspecified: Secondary | ICD-10-CM

## 2016-05-25 NOTE — Progress Notes (Signed)
BP 108/72 (BP Location: Left Arm, Patient Position: Sitting, Cuff Size: Large) Comment (Cuff Size): Thigh cuff  Pulse 91   Temp 97.9 F (36.6 C)   Ht 5\' 9"  (1.753 m)   Wt (!) 389 lb 8 oz (176.7 kg)   SpO2 99%   BMI 57.52 kg/m    Subjective:    Patient ID: Adam Long, male    DOB: 1976/08/06, 40 y.o.   MRN: [id number]  HPI: Adam Long is a 40 y.o. male presenting on 05/25/2016 for Diabetes   HPI  Pt feeling fine today. No complaints. bs log range- 172-210  Relevant past medical, surgical, family and social history reviewed and updated as indicated. Interim medical history since our last visit reviewed. Allergies and medications reviewed and updated.   Current Outpatient Prescriptions:  .  insulin glargine (LANTUS) 100 UNIT/ML injection, Inject 85 Units into the skin at bedtime. , Disp: , Rfl:  .  JANUMET 50-500 MG tablet, TAKE 1 Tablet  BY MOUTH TWICE DAILY, Disp: 180 tablet, Rfl: 2 .  lisinopril (PRINIVIL,ZESTRIL) 20 MG tablet, TAKE ONE TABLET BY MOUTH ONCE DAILY FOR BLOOD PRESSURE, Disp: 30 tablet, Rfl: 3 .  loperamide (IMODIUM) 2 MG capsule, Take 4 mg by mouth as needed for diarrhea or loose stools., Disp: , Rfl:  .  lovastatin (MEVACOR) 20 MG tablet, TAKE ONE TABLET BY MOUTH ONCE DAILY AT BEDTIME FOR CHOLESTEROL, Disp: 30 tablet, Rfl: 4 .  metoprolol (LOPRESSOR) 50 MG tablet, TAKE ONE TABLET BY MOUTH TWICE DAILY FOR BLOOD PRESSURE, Disp: 60 tablet, Rfl: 2 .  Omega-3 Fatty Acids (FISH OIL) 1000 MG CAPS, Take 2 capsules by mouth daily., Disp: , Rfl:   Review of Systems  Constitutional: Negative for appetite change, chills, diaphoresis, fatigue, fever and unexpected weight change.  HENT: Positive for dental problem. Negative for congestion, drooling, ear pain, facial swelling, hearing loss, mouth sores, sneezing, sore throat, trouble swallowing and voice change.   Eyes: Negative for pain, discharge, redness, itching and visual disturbance.  Respiratory: Negative for  cough, choking, shortness of breath and wheezing.   Cardiovascular: Negative for chest pain, palpitations and leg swelling.  Gastrointestinal: Negative for abdominal pain, blood in stool, constipation, diarrhea and vomiting.  Endocrine: Negative for cold intolerance, heat intolerance and polydipsia.  Genitourinary: Negative for decreased urine volume, dysuria and hematuria.  Musculoskeletal: Negative for arthralgias, back pain and gait problem.  Skin: Negative for rash.  Allergic/Immunologic: Negative for environmental allergies.  Neurological: Negative for seizures, syncope, light-headedness and headaches.  Hematological: Negative for adenopathy.  Psychiatric/Behavioral: Negative for agitation, dysphoric mood and suicidal ideas. The patient is nervous/anxious.     Per HPI unless specifically indicated above     Objective:    BP 108/72 (BP Location: Left Arm, Patient Position: Sitting, Cuff Size: Large) Comment (Cuff Size): Thigh cuff  Pulse 91   Temp 97.9 F (36.6 C)   Ht 5\' 9"  (1.753 m)   Wt (!) 389 lb 8 oz (176.7 kg)   SpO2 99%   BMI 57.52 kg/m   Wt Readings from Last 3 Encounters:  05/25/16 (!) 389 lb 8 oz (176.7 kg)  04/13/16 (!) 384 lb (174.2 kg)  02/12/16 (!) 385 lb (174.6 kg)    Physical Exam  Constitutional: He is oriented to person, place, and time. He appears well-developed and well-nourished.  HENT:  Head: Normocephalic and atraumatic.  Neck: Neck supple.  Cardiovascular: Normal rate and regular rhythm.   Pulmonary/Chest: Effort normal and breath sounds  normal. He has no wheezes.  Abdominal: Soft. Bowel sounds are normal. There is no hepatosplenomegaly. There is no tenderness.  Musculoskeletal: He exhibits no edema.  Lymphadenopathy:    He has no cervical adenopathy.  Neurological: He is alert and oriented to person, place, and time.  Skin: Skin is warm and dry.  Psychiatric: He has a normal mood and affect. His behavior is normal.  Vitals reviewed.        Assessment & Plan:    Encounter Diagnoses  Name Primary?  Marland Kitchen Uncontrolled diabetes mellitus type 2 without complications, unspecified long term insulin use status (HCC) Yes  . Hyperlipidemia   . Essential hypertension, benign   . Morbid obesity, unspecified obesity type (HCC)     -Increase lantus to 90 u -pt reminded to call office for fbs <70 or >300 -F/u 2 months with labs before appt

## 2016-06-28 ENCOUNTER — Ambulatory Visit: Payer: Self-pay

## 2016-07-20 ENCOUNTER — Other Ambulatory Visit: Payer: Self-pay

## 2016-07-20 DIAGNOSIS — E1165 Type 2 diabetes mellitus with hyperglycemia: Principal | ICD-10-CM

## 2016-07-20 DIAGNOSIS — E785 Hyperlipidemia, unspecified: Secondary | ICD-10-CM

## 2016-07-20 DIAGNOSIS — IMO0001 Reserved for inherently not codable concepts without codable children: Secondary | ICD-10-CM

## 2016-07-20 DIAGNOSIS — I10 Essential (primary) hypertension: Secondary | ICD-10-CM

## 2016-07-22 LAB — LIPID PANEL
Cholesterol: 168 mg/dL (ref 125–200)
HDL: 35 mg/dL — ABNORMAL LOW (ref >=40)
LDL CALC: 92 mg/dL (ref <130)
Total CHOL/HDL Ratio: 4.8 Ratio (ref <=5.0)
Triglycerides: 205 mg/dL — ABNORMAL HIGH (ref <150)
VLDL: 41 mg/dL — ABNORMAL HIGH (ref <30)

## 2016-07-22 LAB — COMPREHENSIVE METABOLIC PANEL
ALBUMIN: 3.7 g/dL (ref 3.6–5.1)
ALT: 31 U/L (ref 9–46)
AST: 22 U/L (ref 10–40)
Alkaline Phosphatase: 42 U/L (ref 40–115)
BILIRUBIN TOTAL: 0.7 mg/dL (ref 0.2–1.2)
BUN: 12 mg/dL (ref 7–25)
CO2: 27 mmol/L (ref 20–31)
CREATININE: 0.79 mg/dL (ref 0.60–1.35)
Calcium: 9.1 mg/dL (ref 8.6–10.3)
Chloride: 98 mmol/L (ref 98–110)
GLUCOSE: 204 mg/dL — AB (ref 65–99)
Potassium: 4.4 mmol/L (ref 3.5–5.3)
SODIUM: 135 mmol/L (ref 135–146)
Total Protein: 6.9 g/dL (ref 6.1–8.1)

## 2016-07-22 LAB — HEMOGLOBIN A1C
HEMOGLOBIN A1C: 10.3 % — AB (ref <5.7)
Mean Plasma Glucose: 249 mg/dL

## 2016-07-26 ENCOUNTER — Other Ambulatory Visit: Payer: Self-pay | Admitting: Physician Assistant

## 2016-07-26 ENCOUNTER — Ambulatory Visit: Payer: Self-pay | Admitting: Physician Assistant

## 2016-07-26 ENCOUNTER — Encounter: Payer: Self-pay | Admitting: Physician Assistant

## 2016-07-26 VITALS — BP 108/64 | HR 91 | Temp 97.7°F | Ht 69.0 in | Wt 385.5 lb

## 2016-07-26 DIAGNOSIS — E785 Hyperlipidemia, unspecified: Secondary | ICD-10-CM

## 2016-07-26 DIAGNOSIS — IMO0001 Reserved for inherently not codable concepts without codable children: Secondary | ICD-10-CM

## 2016-07-26 DIAGNOSIS — E1165 Type 2 diabetes mellitus with hyperglycemia: Principal | ICD-10-CM

## 2016-07-26 DIAGNOSIS — I10 Essential (primary) hypertension: Secondary | ICD-10-CM

## 2016-07-26 NOTE — Progress Notes (Signed)
BP 108/64 (BP Location: Left Arm, Patient Position: Sitting, Cuff Size: Large) Comment (Cuff Size): thigh cuff  Pulse 91   Temp 97.7 F (36.5 C)   Ht 5\' 9"  (1.753 m)   Wt (!) 385 lb 8 oz (174.9 kg)   SpO2 97%   BMI 56.93 kg/m    Subjective:    Patient ID: Adam Long, male    DOB: 04/22/1976, 40 y.o.   MRN: [id number]  HPI: Adam FlankKenneth R Mateja is a 40 y.o. male presenting on 07/26/2016 for No chief complaint on file.   HPI  Pt says he tries to check his bs once daily.  He usually checks it in the morning.  He says it is usually in the low 200s.  He has had no episodes of it being low.   Relevant past medical, surgical, family and social history reviewed and updated as indicated. Interim medical history since our last visit reviewed. Allergies and medications reviewed and updated.   Current Outpatient Prescriptions:  .  insulin glargine (LANTUS) 100 UNIT/ML injection, Inject 90 Units into the skin at bedtime. , Disp: , Rfl:  .  JANUMET 50-500 MG tablet, TAKE 1 Tablet  BY MOUTH TWICE DAILY, Disp: 180 tablet, Rfl: 2 .  lisinopril (PRINIVIL,ZESTRIL) 20 MG tablet, TAKE ONE TABLET BY MOUTH ONCE DAILY FOR BLOOD PRESSURE, Disp: 30 tablet, Rfl: 3 .  loperamide (IMODIUM) 2 MG capsule, Take 4 mg by mouth as needed for diarrhea or loose stools., Disp: , Rfl:  .  lovastatin (MEVACOR) 20 MG tablet, TAKE ONE TABLET BY MOUTH ONCE DAILY AT BEDTIME FOR CHOLESTEROL, Disp: 30 tablet, Rfl: 4 .  metoprolol (LOPRESSOR) 50 MG tablet, TAKE ONE TABLET BY MOUTH TWICE DAILY FOR BLOOD PRESSURE, Disp: 60 tablet, Rfl: 2 .  Omega-3 Fatty Acids (FISH OIL) 1000 MG CAPS, Take 2 capsules by mouth daily., Disp: , Rfl:    Review of Systems  Constitutional: Negative for appetite change, chills, diaphoresis, fatigue, fever and unexpected weight change.  HENT: Positive for dental problem. Negative for congestion, drooling, ear pain, facial swelling, hearing loss, mouth sores, sneezing, sore throat, trouble swallowing  and voice change.   Eyes: Negative for pain, discharge, redness, itching and visual disturbance.  Respiratory: Negative for cough, choking, shortness of breath and wheezing.   Cardiovascular: Negative for chest pain, palpitations and leg swelling.  Gastrointestinal: Positive for diarrhea (occassionally). Negative for abdominal pain, blood in stool, constipation and vomiting.  Endocrine: Negative for cold intolerance, heat intolerance and polydipsia.  Genitourinary: Negative for decreased urine volume, dysuria and hematuria.  Musculoskeletal: Negative for arthralgias, back pain and gait problem.  Skin: Negative for rash.  Allergic/Immunologic: Negative for environmental allergies.  Neurological: Negative for seizures, syncope, light-headedness and headaches.  Hematological: Negative for adenopathy.  Psychiatric/Behavioral: Negative for agitation, dysphoric mood and suicidal ideas. The patient is nervous/anxious.     Per HPI unless specifically indicated above     Objective:    BP 108/64 (BP Location: Left Arm, Patient Position: Sitting, Cuff Size: Large) Comment (Cuff Size): thigh cuff  Pulse 91   Temp 97.7 F (36.5 C)   Ht 5\' 9"  (1.753 m)   Wt (!) 385 lb 8 oz (174.9 kg)   SpO2 97%   BMI 56.93 kg/m   Wt Readings from Last 3 Encounters:  07/26/16 (!) 385 lb 8 oz (174.9 kg)  05/25/16 (!) 389 lb 8 oz (176.7 kg)  04/13/16 (!) 384 lb (174.2 kg)    Physical Exam  Constitutional:  He is oriented to person, place, and time. He appears well-developed and well-nourished.  HENT:  Head: Normocephalic and atraumatic.  Neck: Neck supple.  Cardiovascular: Normal rate and regular rhythm.   Pulmonary/Chest: Effort normal and breath sounds normal. He has no wheezes.  Abdominal: Soft. Bowel sounds are normal. There is no hepatosplenomegaly. There is no tenderness.  Musculoskeletal: He exhibits no edema.  Lymphadenopathy:    He has no cervical adenopathy.  Neurological: He is alert and  oriented to person, place, and time.  Skin: Skin is warm and dry.  Psychiatric: He has a normal mood and affect. His behavior is normal.  Vitals reviewed.   Results for orders placed or performed in visit on 07/20/16  HgB A1c  Result Value Ref Range   Hgb A1c MFr Bld 10.3 (H) <5.7 %   Mean Plasma Glucose 249 mg/dL  Comprehensive Metabolic Panel (CMET)  Result Value Ref Range   Sodium 135 135 - 146 mmol/L   Potassium 4.4 3.5 - 5.3 mmol/L   Chloride 98 98 - 110 mmol/L   CO2 27 20 - 31 mmol/L   Glucose, Bld 204 (H) 65 - 99 mg/dL   BUN 12 7 - 25 mg/dL   Creat 1.610.79 0.960.60 - 0.451.35 mg/dL   Total Bilirubin 0.7 0.2 - 1.2 mg/dL   Alkaline Phosphatase 42 40 - 115 U/L   AST 22 10 - 40 U/L   ALT 31 9 - 46 U/L   Total Protein 6.9 6.1 - 8.1 g/dL   Albumin 3.7 3.6 - 5.1 g/dL   Calcium 9.1 8.6 - 40.910.3 mg/dL  Lipid Profile  Result Value Ref Range   Cholesterol 168 125 - 200 mg/dL   Triglycerides 811205 (H) <150 mg/dL   HDL 35 (L) >=91>=40 mg/dL   Total CHOL/HDL Ratio 4.8 <=5.0 Ratio   VLDL 41 (H) <30 mg/dL   LDL Cholesterol 92 <478<130 mg/dL      Assessment & Plan:   Encounter Diagnoses  Name Primary?  Marland Kitchen. Uncontrolled diabetes mellitus type 2 without complications, unspecified long term insulin use status (HCC) Yes  . Essential hypertension, benign   . Hyperlipidemia, unspecified hyperlipidemia type   . Morbid obesity, unspecified obesity type (HCC)     -reviewed labs with pt.  a1c up despite increases in medication at last OV.  Increase lantus to 100u qam and f/u 1 month with bs log.  Discussed with pt if a1c not improved in 3 mo will refer to endocrine

## 2016-08-25 ENCOUNTER — Encounter: Payer: Self-pay | Admitting: Physician Assistant

## 2016-08-25 ENCOUNTER — Ambulatory Visit: Payer: Self-pay | Admitting: Physician Assistant

## 2016-08-25 VITALS — BP 124/86 | HR 99 | Temp 97.5°F | Ht 69.0 in | Wt 397.0 lb

## 2016-08-25 DIAGNOSIS — E1165 Type 2 diabetes mellitus with hyperglycemia: Principal | ICD-10-CM

## 2016-08-25 DIAGNOSIS — IMO0001 Reserved for inherently not codable concepts without codable children: Secondary | ICD-10-CM

## 2016-08-25 NOTE — Progress Notes (Signed)
BP 124/86 (BP Location: Left Arm, Patient Position: Sitting, Cuff Size: Large)   Pulse 99   Temp 97.5 F (36.4 C) (Other (Comment))   Ht 5\' 9"  (1.753 m)   Wt (!) 397 lb (180.1 kg)   SpO2 95%   BMI 58.63 kg/m    Subjective:    Patient ID: Adam Long, male    DOB: 1976-04-08, 40 y.o.   MRN: [id number]  HPI: Adam Long is a 40 y.o. male presenting on 08/25/2016 for Diabetes   HPI   Reviewed bs log- today 162.  This month mostly under 200 but still up 170s trend.  All checks are before breakfast.   Relevant past medical, surgical, family and social history reviewed and updated as indicated. Interim medical history since our last visit reviewed. Allergies and medications reviewed and updated.   Current Outpatient Prescriptions:  .  insulin glargine (LANTUS) 100 UNIT/ML injection, Inject 100 Units into the skin at bedtime. , Disp: , Rfl:  .  JANUMET 50-500 MG tablet, TAKE 1 Tablet  BY MOUTH TWICE DAILY, Disp: 180 tablet, Rfl: 2 .  lisinopril (PRINIVIL,ZESTRIL) 20 MG tablet, TAKE ONE TABLET BY MOUTH ONCE DAILY FOR BLOOD PRESSURE, Disp: 30 tablet, Rfl: 4 .  loperamide (IMODIUM) 2 MG capsule, Take 4 mg by mouth as needed for diarrhea or loose stools., Disp: , Rfl:  .  lovastatin (MEVACOR) 20 MG tablet, TAKE ONE TABLET BY MOUTH ONCE DAILY AT BEDTIME FOR CHOLESTEROL, Disp: 30 tablet, Rfl: 4 .  metoprolol (LOPRESSOR) 50 MG tablet, TAKE ONE TABLET BY MOUTH TWICE DAILY FOR BLOOD PRESSURE, Disp: 60 tablet, Rfl: 4 .  Omega-3 Fatty Acids (FISH OIL) 1000 MG CAPS, Take 2 capsules by mouth daily., Disp: , Rfl:    Review of Systems  Constitutional: Negative for appetite change, chills, diaphoresis, fatigue, fever and unexpected weight change.  HENT: Positive for dental problem. Negative for congestion, drooling, ear pain, facial swelling, hearing loss, mouth sores, sneezing, sore throat, trouble swallowing and voice change.   Eyes: Negative for pain, discharge, redness, itching and  visual disturbance.  Respiratory: Negative for cough, choking, shortness of breath and wheezing.   Cardiovascular: Negative for chest pain, palpitations and leg swelling.  Gastrointestinal: Negative for abdominal pain, blood in stool, constipation, diarrhea and vomiting.  Endocrine: Negative for cold intolerance, heat intolerance and polydipsia.  Genitourinary: Negative for decreased urine volume, dysuria and hematuria.  Musculoskeletal: Negative for arthralgias, back pain and gait problem.  Skin: Negative for rash.  Allergic/Immunologic: Negative for environmental allergies.  Neurological: Negative for seizures, syncope, light-headedness and headaches.  Hematological: Negative for adenopathy.  Psychiatric/Behavioral: Positive for agitation and dysphoric mood. Negative for suicidal ideas. The patient is nervous/anxious.     Per HPI unless specifically indicated above     Objective:    BP 124/86 (BP Location: Left Arm, Patient Position: Sitting, Cuff Size: Large)   Pulse 99   Temp 97.5 F (36.4 C) (Other (Comment))   Ht 5\' 9"  (1.753 m)   Wt (!) 397 lb (180.1 kg)   SpO2 95%   BMI 58.63 kg/m   Wt Readings from Last 3 Encounters:  08/25/16 (!) 397 lb (180.1 kg)  07/26/16 (!) 385 lb 8 oz (174.9 kg)  05/25/16 (!) 389 lb 8 oz (176.7 kg)    Physical Exam  Constitutional: He is oriented to person, place, and time. He appears well-developed and well-nourished.  HENT:  Head: Normocephalic and atraumatic.  Neck: Neck supple.  Cardiovascular: Normal rate  and regular rhythm.   Pulmonary/Chest: Effort normal and breath sounds normal. He has no wheezes.  Abdominal: Soft. Bowel sounds are normal. There is no hepatosplenomegaly. There is no tenderness.  Musculoskeletal: He exhibits no edema.  Lymphadenopathy:    He has no cervical adenopathy.  Neurological: He is alert and oriented to person, place, and time.  Skin: Skin is warm and dry.  Psychiatric: He has a normal mood and affect. His  behavior is normal.  Vitals reviewed.       Assessment & Plan:   Encounter Diagnosis  Name Primary?  Marland Kitchen. Uncontrolled diabetes mellitus type 2 without complications, unspecified long term insulin use status (HCC) Yes     -added novolog with ssi before each meal. Gave samples novolog -pt reminded to call office for fbs < 70 or > 300 -pt to follow up with bs log in one month.  RTO sooner prn

## 2016-09-30 ENCOUNTER — Ambulatory Visit: Payer: Self-pay | Admitting: Physician Assistant

## 2016-09-30 ENCOUNTER — Encounter: Payer: Self-pay | Admitting: Physician Assistant

## 2016-09-30 VITALS — BP 114/82 | HR 89 | Temp 97.3°F | Ht 69.0 in | Wt 387.5 lb

## 2016-09-30 DIAGNOSIS — IMO0001 Reserved for inherently not codable concepts without codable children: Secondary | ICD-10-CM

## 2016-09-30 DIAGNOSIS — I10 Essential (primary) hypertension: Secondary | ICD-10-CM

## 2016-09-30 DIAGNOSIS — E1165 Type 2 diabetes mellitus with hyperglycemia: Principal | ICD-10-CM

## 2016-09-30 DIAGNOSIS — E785 Hyperlipidemia, unspecified: Secondary | ICD-10-CM

## 2016-09-30 NOTE — Progress Notes (Signed)
BP 114/82 (BP Location: Left Arm, Patient Position: Sitting, Cuff Size: Large)   Pulse 89   Temp 97.3 F (36.3 C) (Other (Comment))   Ht 5\' 9"  (1.753 m)   Wt (!) 387 lb 8 oz (175.8 kg)   SpO2 97%   BMI 57.22 kg/m    Subjective:    Patient ID: Adam Long, male    DOB: [birth year], 41 y.o.   MRN: [id number]  HPI: Adam Long is a 41 y.o. male presenting on 09/30/2016 for Diabetes   HPI   bs log reviewed.  Pt states he is feeling better since starting the novolog before meals  Relevant past medical, surgical, family and social history reviewed and updated as indicated. Interim medical history since our last visit reviewed. Allergies and medications reviewed and updated.   Current Outpatient Prescriptions:  .  insulin aspart (NOVOLOG) 100 UNIT/ML injection, Inject 2-8 Units into the skin 3 (three) times daily before meals., Disp: , Rfl:  .  insulin glargine (LANTUS) 100 UNIT/ML injection, Inject 100 Units into the skin at bedtime. , Disp: , Rfl:  .  JANUMET 50-500 MG tablet, TAKE 1 Tablet  BY MOUTH TWICE DAILY, Disp: 180 tablet, Rfl: 2 .  lisinopril (PRINIVIL,ZESTRIL) 20 MG tablet, TAKE ONE TABLET BY MOUTH ONCE DAILY FOR BLOOD PRESSURE, Disp: 30 tablet, Rfl: 4 .  loperamide (IMODIUM) 2 MG capsule, Take 4 mg by mouth as needed for diarrhea or loose stools., Disp: , Rfl:  .  lovastatin (MEVACOR) 20 MG tablet, TAKE ONE TABLET BY MOUTH ONCE DAILY AT BEDTIME FOR CHOLESTEROL, Disp: 30 tablet, Rfl: 4 .  metoprolol (LOPRESSOR) 50 MG tablet, TAKE ONE TABLET BY MOUTH TWICE DAILY FOR BLOOD PRESSURE, Disp: 60 tablet, Rfl: 4 .  Omega-3 Fatty Acids (FISH OIL) 1000 MG CAPS, Take 2 capsules by mouth daily., Disp: , Rfl:    Review of Systems  Constitutional: Negative for appetite change, chills, diaphoresis, fatigue, fever and unexpected weight change.  HENT: Positive for dental problem. Negative for congestion, drooling, ear pain, facial swelling, hearing loss, mouth sores, sneezing, sore  throat, trouble swallowing and voice change.   Eyes: Negative for pain, discharge, redness, itching and visual disturbance.  Respiratory: Positive for cough. Negative for choking, shortness of breath and wheezing.   Cardiovascular: Negative for chest pain, palpitations and leg swelling.  Gastrointestinal: Negative for abdominal pain, blood in stool, constipation, diarrhea and vomiting.  Endocrine: Negative for cold intolerance, heat intolerance and polydipsia.  Genitourinary: Negative for decreased urine volume, dysuria and hematuria.  Musculoskeletal: Negative for arthralgias, back pain and gait problem.  Skin: Negative for rash.  Allergic/Immunologic: Negative for environmental allergies.  Neurological: Negative for seizures, syncope, light-headedness and headaches.  Hematological: Negative for adenopathy.  Psychiatric/Behavioral: Positive for dysphoric mood. Negative for agitation and suicidal ideas. The patient is not nervous/anxious.     Per HPI unless specifically indicated above     Objective:    BP 114/82 (BP Location: Left Arm, Patient Position: Sitting, Cuff Size: Large)   Pulse 89   Temp 97.3 F (36.3 C) (Other (Comment))   Ht 5\' 9"  (1.753 m)   Wt (!) 387 lb 8 oz (175.8 kg)   SpO2 97%   BMI 57.22 kg/m   Wt Readings from Last 3 Encounters:  09/30/16 (!) 387 lb 8 oz (175.8 kg)  08/25/16 (!) 397 lb (180.1 kg)  07/26/16 (!) 385 lb 8 oz (174.9 kg)    Physical Exam  Constitutional: He is oriented to  person, place, and time. He appears well-developed and well-nourished.  HENT:  Head: Normocephalic and atraumatic.  Neck: Neck supple.  Cardiovascular: Normal rate and regular rhythm.   Pulmonary/Chest: Effort normal and breath sounds normal. He has no wheezes.  Abdominal: Soft. Bowel sounds are normal. There is no hepatosplenomegaly. There is no tenderness.  Musculoskeletal: He exhibits no edema.  Lymphadenopathy:    He has no cervical adenopathy.  Neurological: He is  alert and oriented to person, place, and time.  Skin: Skin is warm and dry.  Psychiatric: He has a normal mood and affect. His behavior is normal.  Vitals reviewed.   Results for orders placed or performed in visit on 07/20/16  HgB A1c  Result Value Ref Range   Hgb A1c MFr Bld 10.3 (H) <5.7 %   Mean Plasma Glucose 249 mg/dL  Comprehensive Metabolic Panel (CMET)  Result Value Ref Range   Sodium 135 135 - 146 mmol/L   Potassium 4.4 3.5 - 5.3 mmol/L   Chloride 98 98 - 110 mmol/L   CO2 27 20 - 31 mmol/L   Glucose, Bld 204 (H) 65 - 99 mg/dL   BUN 12 7 - 25 mg/dL   Creat 5.40 9.81 - 1.91 mg/dL   Total Bilirubin 0.7 0.2 - 1.2 mg/dL   Alkaline Phosphatase 42 40 - 115 U/L   AST 22 10 - 40 U/L   ALT 31 9 - 46 U/L   Total Protein 6.9 6.1 - 8.1 g/dL   Albumin 3.7 3.6 - 5.1 g/dL   Calcium 9.1 8.6 - 47.8 mg/dL  Lipid Profile  Result Value Ref Range   Cholesterol 168 125 - 200 mg/dL   Triglycerides 295 (H) <150 mg/dL   HDL 35 (L) >=62 mg/dL   Total CHOL/HDL Ratio 4.8 <=5.0 Ratio   VLDL 41 (H) <30 mg/dL   LDL Cholesterol 92 <130 mg/dL      Assessment & Plan:   Encounter Diagnoses  Name Primary?  Marland Kitchen Uncontrolled diabetes mellitus type 2 without complications, unspecified long term insulin use status (HCC) Yes  . Essential hypertension, benign   . Hyperlipidemia, unspecified hyperlipidemia type   . Morbid obesity, unspecified obesity type (HCC)     -continue current medications.  Watch diabetic diet. Monitor bs and notify office for fbs < 70 or > 300 -follow up 1 month.  RTO sooner prn

## 2016-10-25 ENCOUNTER — Other Ambulatory Visit: Payer: Self-pay

## 2016-10-25 DIAGNOSIS — IMO0001 Reserved for inherently not codable concepts without codable children: Secondary | ICD-10-CM

## 2016-10-25 DIAGNOSIS — E1165 Type 2 diabetes mellitus with hyperglycemia: Principal | ICD-10-CM

## 2016-10-25 DIAGNOSIS — E785 Hyperlipidemia, unspecified: Secondary | ICD-10-CM

## 2016-10-25 DIAGNOSIS — I10 Essential (primary) hypertension: Secondary | ICD-10-CM

## 2016-10-27 LAB — COMPREHENSIVE METABOLIC PANEL
ALBUMIN: 3.6 g/dL (ref 3.6–5.1)
ALK PHOS: 42 U/L (ref 40–115)
ALT: 27 U/L (ref 9–46)
AST: 19 U/L (ref 10–40)
BUN: 11 mg/dL (ref 7–25)
CHLORIDE: 99 mmol/L (ref 98–110)
CO2: 28 mmol/L (ref 20–31)
Calcium: 9.1 mg/dL (ref 8.6–10.3)
Creat: 0.72 mg/dL (ref 0.60–1.35)
Glucose, Bld: 153 mg/dL — ABNORMAL HIGH (ref 65–99)
POTASSIUM: 4.8 mmol/L (ref 3.5–5.3)
Sodium: 135 mmol/L (ref 135–146)
TOTAL PROTEIN: 6.6 g/dL (ref 6.1–8.1)
Total Bilirubin: 0.7 mg/dL (ref 0.2–1.2)

## 2016-10-27 LAB — HEMOGLOBIN A1C
Hgb A1c MFr Bld: 8.9 % — ABNORMAL HIGH (ref <5.7)
MEAN PLASMA GLUCOSE: 209 mg/dL

## 2016-10-27 LAB — MICROALBUMIN, URINE: Microalb, Ur: 0.7 mg/dL

## 2016-10-27 LAB — LIPID PANEL
CHOLESTEROL: 143 mg/dL (ref <200)
HDL: 30 mg/dL — ABNORMAL LOW (ref >40)
LDL Cholesterol: 74 mg/dL (ref <100)
TRIGLYCERIDES: 195 mg/dL — AB (ref <150)
Total CHOL/HDL Ratio: 4.8 Ratio (ref <5.0)
VLDL: 39 mg/dL — AB (ref <30)

## 2016-11-01 ENCOUNTER — Encounter: Payer: Self-pay | Admitting: Physician Assistant

## 2016-11-01 ENCOUNTER — Ambulatory Visit: Payer: Self-pay | Admitting: Physician Assistant

## 2016-11-01 VITALS — BP 106/68 | HR 77 | Temp 98.1°F | Ht 69.0 in | Wt 385.8 lb

## 2016-11-01 DIAGNOSIS — E785 Hyperlipidemia, unspecified: Secondary | ICD-10-CM

## 2016-11-01 DIAGNOSIS — I10 Essential (primary) hypertension: Secondary | ICD-10-CM

## 2016-11-01 DIAGNOSIS — IMO0001 Reserved for inherently not codable concepts without codable children: Secondary | ICD-10-CM

## 2016-11-01 DIAGNOSIS — E1165 Type 2 diabetes mellitus with hyperglycemia: Principal | ICD-10-CM

## 2016-11-01 NOTE — Progress Notes (Signed)
BP 106/68 (BP Location: Left Arm, Patient Position: Sitting, Cuff Size: Large)   Pulse 77   Temp 98.1 F (36.7 C)   Ht 5\' 9"  (1.753 m)   Wt (!) 385 lb 12 oz (175 kg)   SpO2 99%   BMI 56.97 kg/m    Subjective:    Patient ID: Adam Long, male    DOB: 10-Jun-1976, 41 y.o.   MRN: [id number]  HPI: Adam Long is a 41 y.o. male presenting on 11/01/2016 for Diabetes   HPI   a1c on 07/20/16 was 10.3 a1c on 10/25/16 is 8.9  bs log past week all but one in the one-hundreds.  No lows  Pt is feeling well today  Relevant past medical, surgical, family and social history reviewed and updated as indicated. Interim medical history since our last visit reviewed. Allergies and medications reviewed and updated.   Current Outpatient Prescriptions:  .  insulin aspart (NOVOLOG) 100 UNIT/ML injection, Inject 2-8 Units into the skin 3 (three) times daily before meals., Disp: , Rfl:  .  insulin glargine (LANTUS) 100 UNIT/ML injection, Inject 100 Units into the skin at bedtime. , Disp: , Rfl:  .  JANUMET 50-500 MG tablet, TAKE 1 Tablet  BY MOUTH TWICE DAILY, Disp: 180 tablet, Rfl: 2 .  lisinopril (PRINIVIL,ZESTRIL) 20 MG tablet, TAKE ONE TABLET BY MOUTH ONCE DAILY FOR BLOOD PRESSURE, Disp: 30 tablet, Rfl: 4 .  loperamide (IMODIUM) 2 MG capsule, Take 4 mg by mouth as needed for diarrhea or loose stools., Disp: , Rfl:  .  lovastatin (MEVACOR) 20 MG tablet, TAKE ONE TABLET BY MOUTH ONCE DAILY AT BEDTIME FOR CHOLESTEROL, Disp: 30 tablet, Rfl: 4 .  metoprolol (LOPRESSOR) 50 MG tablet, TAKE ONE TABLET BY MOUTH TWICE DAILY FOR BLOOD PRESSURE, Disp: 60 tablet, Rfl: 4 .  Omega-3 Fatty Acids (FISH OIL) 1000 MG CAPS, Take 2 capsules by mouth daily., Disp: , Rfl:   Review of Systems  Constitutional: Negative for appetite change, chills, diaphoresis, fatigue, fever and unexpected weight change.  HENT: Positive for dental problem. Negative for congestion, drooling, ear pain, facial swelling, hearing loss,  mouth sores, sneezing, sore throat, trouble swallowing and voice change.   Eyes: Negative for pain, discharge, redness, itching and visual disturbance.  Respiratory: Negative for cough, choking, shortness of breath and wheezing.   Cardiovascular: Negative for chest pain, palpitations and leg swelling.  Gastrointestinal: Negative for abdominal pain, blood in stool, constipation, diarrhea and vomiting.  Endocrine: Negative for cold intolerance, heat intolerance and polydipsia.  Genitourinary: Negative for decreased urine volume, dysuria and hematuria.  Musculoskeletal: Negative for arthralgias, back pain and gait problem.  Skin: Negative for rash.  Allergic/Immunologic: Negative for environmental allergies.  Neurological: Negative for seizures, syncope, light-headedness and headaches.  Hematological: Negative for adenopathy.  Psychiatric/Behavioral: Positive for dysphoric mood. Negative for agitation and suicidal ideas. The patient is not nervous/anxious.     Per HPI unless specifically indicated above     Objective:    BP 106/68 (BP Location: Left Arm, Patient Position: Sitting, Cuff Size: Large)   Pulse 77   Temp 98.1 F (36.7 C)   Ht 5\' 9"  (1.753 m)   Wt (!) 385 lb 12 oz (175 kg)   SpO2 99%   BMI 56.97 kg/m   Wt Readings from Last 3 Encounters:  11/01/16 (!) 385 lb 12 oz (175 kg)  09/30/16 (!) 387 lb 8 oz (175.8 kg)  08/25/16 (!) 397 lb (180.1 kg)    Physical  Exam  Constitutional: He is oriented to person, place, and time. He appears well-developed and well-nourished.  HENT:  Head: Normocephalic and atraumatic.  Neck: Neck supple.  Cardiovascular: Normal rate and regular rhythm.   Pulmonary/Chest: Effort normal and breath sounds normal. He has no wheezes.  Abdominal: Soft. Bowel sounds are normal. There is no hepatosplenomegaly. There is no tenderness.  Musculoskeletal: He exhibits no edema.  Lymphadenopathy:    He has no cervical adenopathy.  Neurological: He is alert  and oriented to person, place, and time.  Skin: Skin is warm and dry.  Psychiatric: He has a normal mood and affect. His behavior is normal.  Vitals reviewed.   Results for orders placed or performed in visit on 10/25/16  Microalbumin, urine  Result Value Ref Range   Microalb, Ur 0.7 Not estab mg/dL  Comprehensive metabolic panel  Result Value Ref Range   Sodium 135 135 - 146 mmol/L   Potassium 4.8 3.5 - 5.3 mmol/L   Chloride 99 98 - 110 mmol/L   CO2 28 20 - 31 mmol/L   Glucose, Bld 153 (H) 65 - 99 mg/dL   BUN 11 7 - 25 mg/dL   Creat 1.610.72 0.960.60 - 0.451.35 mg/dL   Total Bilirubin 0.7 0.2 - 1.2 mg/dL   Alkaline Phosphatase 42 40 - 115 U/L   AST 19 10 - 40 U/L   ALT 27 9 - 46 U/L   Total Protein 6.6 6.1 - 8.1 g/dL   Albumin 3.6 3.6 - 5.1 g/dL   Calcium 9.1 8.6 - 40.910.3 mg/dL  Lipid panel  Result Value Ref Range   Cholesterol 143 <200 mg/dL   Triglycerides 811195 (H) <150 mg/dL   HDL 30 (L) >91>40 mg/dL   Total CHOL/HDL Ratio 4.8 <5.0 Ratio   VLDL 39 (H) <30 mg/dL   LDL Cholesterol 74 <478<100 mg/dL  Hemoglobin G9FA1c  Result Value Ref Range   Hgb A1c MFr Bld 8.9 (H) <5.7 %   Mean Plasma Glucose 209 mg/dL      Assessment & Plan:   Encounter Diagnoses  Name Primary?  Marland Kitchen. Uncontrolled diabetes mellitus type 2 without complications, unspecified long term insulin use status (HCC) Yes  . Essential hypertension, benign   . Hyperlipidemia, unspecified hyperlipidemia type   . Morbid obesity, unspecified obesity type (HCC)     -Reviewed labs with pt -Continue current medications -Counseled to watch diabetic diet and exercise regularly -F/u 6 weeks with bs log.  RTO sooner prn

## 2016-12-07 ENCOUNTER — Encounter: Payer: Self-pay | Admitting: Physician Assistant

## 2016-12-09 ENCOUNTER — Other Ambulatory Visit: Payer: Self-pay | Admitting: Physician Assistant

## 2016-12-14 ENCOUNTER — Ambulatory Visit: Payer: Self-pay | Admitting: Physician Assistant

## 2016-12-14 ENCOUNTER — Encounter: Payer: Self-pay | Admitting: Physician Assistant

## 2016-12-14 VITALS — BP 126/78 | HR 86 | Temp 97.7°F | Ht 69.0 in | Wt 392.0 lb

## 2016-12-14 DIAGNOSIS — IMO0001 Reserved for inherently not codable concepts without codable children: Secondary | ICD-10-CM

## 2016-12-14 DIAGNOSIS — E1165 Type 2 diabetes mellitus with hyperglycemia: Principal | ICD-10-CM

## 2016-12-14 DIAGNOSIS — I10 Essential (primary) hypertension: Secondary | ICD-10-CM

## 2016-12-14 DIAGNOSIS — E785 Hyperlipidemia, unspecified: Secondary | ICD-10-CM

## 2016-12-14 NOTE — Progress Notes (Signed)
BP 126/78 (BP Location: Left Arm, Patient Position: Sitting, Cuff Size: Large)   Pulse 86   Temp 97.7 F (36.5 C)   Ht 5\' 9"  (1.753 m)   Wt (!) 392 lb (177.8 kg)   SpO2 97%   BMI 57.89 kg/m    Subjective:    Patient ID: Adam Long, male    DOB: June 18, 1976, 41 y.o.   MRN: [id number]  HPI: Adam Long is a 41 y.o. male presenting on 12/14/2016 for Diabetes   HPI   Reviewed bs log.  Still a bit high.  Relevant past medical, surgical, family and social history reviewed and updated as indicated. Interim medical history since our last visit reviewed. Allergies and medications reviewed and updated.   Current Outpatient Prescriptions:  .  insulin aspart (NOVOLOG) 100 UNIT/ML injection, Inject 2-8 Units into the skin 3 (three) times daily before meals., Disp: , Rfl:  .  insulin glargine (LANTUS) 100 UNIT/ML injection, Inject 100 Units into the skin at bedtime. , Disp: , Rfl:  .  JANUMET 50-500 MG tablet, TAKE 1 Tablet  BY MOUTH TWICE DAILY, Disp: 180 tablet, Rfl: 2 .  lisinopril (PRINIVIL,ZESTRIL) 20 MG tablet, TAKE ONE TABLET BY MOUTH ONCE DAILY FOR BLOOD PRESSURE, Disp: 30 tablet, Rfl: 4 .  loperamide (IMODIUM) 2 MG capsule, Take 4 mg by mouth as needed for diarrhea or loose stools., Disp: , Rfl:  .  lovastatin (MEVACOR) 20 MG tablet, TAKE ONE TABLET BY MOUTH AT BEDTIME FOR CHOLESTEROL, Disp: 30 tablet, Rfl: 4 .  metoprolol (LOPRESSOR) 50 MG tablet, TAKE ONE TABLET BY MOUTH TWICE DAILY FOR BLOOD PRESSURE, Disp: 60 tablet, Rfl: 4 .  Omega-3 Fatty Acids (FISH OIL) 1000 MG CAPS, Take 2 capsules by mouth daily., Disp: , Rfl:    Review of Systems  Constitutional: Negative for appetite change, chills, diaphoresis, fatigue, fever and unexpected weight change.  HENT: Positive for congestion, dental problem and sneezing. Negative for drooling, ear pain, facial swelling, hearing loss, mouth sores, sore throat, trouble swallowing and voice change.   Eyes: Negative for pain, discharge,  redness, itching and visual disturbance.  Respiratory: Positive for cough. Negative for choking, shortness of breath and wheezing.   Cardiovascular: Negative for chest pain, palpitations and leg swelling.  Gastrointestinal: Negative for abdominal pain, blood in stool, constipation, diarrhea and vomiting.  Endocrine: Negative for cold intolerance, heat intolerance and polydipsia.  Genitourinary: Negative for decreased urine volume, dysuria and hematuria.  Musculoskeletal: Negative for arthralgias, back pain and gait problem.  Skin: Negative for rash.  Allergic/Immunologic: Negative for environmental allergies.  Neurological: Negative for seizures, syncope, light-headedness and headaches.  Hematological: Negative for adenopathy.  Psychiatric/Behavioral: Positive for dysphoric mood. Negative for agitation and suicidal ideas. The patient is nervous/anxious.     Per HPI unless specifically indicated above     Objective:    BP 126/78 (BP Location: Left Arm, Patient Position: Sitting, Cuff Size: Large)   Pulse 86   Temp 97.7 F (36.5 C)   Ht 5\' 9"  (1.753 m)   Wt (!) 392 lb (177.8 kg)   SpO2 97%   BMI 57.89 kg/m   Wt Readings from Last 3 Encounters:  12/14/16 (!) 392 lb (177.8 kg)  11/01/16 (!) 385 lb 12 oz (175 kg)  09/30/16 (!) 387 lb 8 oz (175.8 kg)    Physical Exam  Constitutional: He is oriented to person, place, and time. He appears well-developed and well-nourished.  HENT:  Head: Normocephalic and atraumatic.  Neck:  Neck supple.  Cardiovascular: Normal rate and regular rhythm.   Pulmonary/Chest: Effort normal and breath sounds normal. He has no wheezes.  Abdominal: Soft. Bowel sounds are normal. There is no hepatosplenomegaly. There is no tenderness.  Musculoskeletal: He exhibits no edema.  Lymphadenopathy:    He has no cervical adenopathy.  Neurological: He is alert and oriented to person, place, and time.  Skin: Skin is warm and dry.  Psychiatric: He has a normal mood  and affect. His behavior is normal.  Vitals reviewed.       Assessment & Plan:    Encounter Diagnoses  Name Primary?  Marland Kitchen Uncontrolled diabetes mellitus type 2 without complications, unspecified long term insulin use status (HCC) Yes  . Essential hypertension, benign   . Hyperlipidemia, unspecified hyperlipidemia type   . Morbid obesity, unspecified obesity type (HCC)    -counseled pt to Watch diet. Encouraged regular exercise.   -will check labs before next OV and   If a1c not to goal or close to it at next check, will refer to endocrine. Pt agrees. -no medications changes today.  -follow up 1 month

## 2017-01-03 ENCOUNTER — Other Ambulatory Visit: Payer: Self-pay

## 2017-01-03 DIAGNOSIS — IMO0001 Reserved for inherently not codable concepts without codable children: Secondary | ICD-10-CM

## 2017-01-03 DIAGNOSIS — E785 Hyperlipidemia, unspecified: Secondary | ICD-10-CM

## 2017-01-03 DIAGNOSIS — E1165 Type 2 diabetes mellitus with hyperglycemia: Principal | ICD-10-CM

## 2017-01-03 DIAGNOSIS — I10 Essential (primary) hypertension: Secondary | ICD-10-CM

## 2017-01-07 LAB — LIPID PANEL
CHOLESTEROL: 186 mg/dL (ref <200)
HDL: 32 mg/dL — AB (ref >40)
LDL Cholesterol: 100 mg/dL — ABNORMAL HIGH (ref <100)
Total CHOL/HDL Ratio: 5.8 Ratio — ABNORMAL HIGH (ref <5.0)
Triglycerides: 271 mg/dL — ABNORMAL HIGH (ref <150)
VLDL: 54 mg/dL — ABNORMAL HIGH (ref <30)

## 2017-01-07 LAB — COMPREHENSIVE METABOLIC PANEL
ALK PHOS: 46 U/L (ref 40–115)
ALT: 27 U/L (ref 9–46)
AST: 20 U/L (ref 10–40)
Albumin: 3.8 g/dL (ref 3.6–5.1)
BUN: 14 mg/dL (ref 7–25)
CALCIUM: 9.2 mg/dL (ref 8.6–10.3)
CO2: 27 mmol/L (ref 20–31)
Chloride: 97 mmol/L — ABNORMAL LOW (ref 98–110)
Creat: 0.77 mg/dL (ref 0.60–1.35)
GLUCOSE: 211 mg/dL — AB (ref 65–99)
POTASSIUM: 4.8 mmol/L (ref 3.5–5.3)
Sodium: 133 mmol/L — ABNORMAL LOW (ref 135–146)
TOTAL PROTEIN: 6.9 g/dL (ref 6.1–8.1)
Total Bilirubin: 0.7 mg/dL (ref 0.2–1.2)

## 2017-01-08 LAB — HEMOGLOBIN A1C
HEMOGLOBIN A1C: 8.9 % — AB (ref <5.7)
Mean Plasma Glucose: 209 mg/dL

## 2017-01-12 ENCOUNTER — Ambulatory Visit: Payer: Self-pay | Admitting: Physician Assistant

## 2017-01-12 ENCOUNTER — Encounter: Payer: Self-pay | Admitting: Physician Assistant

## 2017-01-12 VITALS — BP 108/64 | HR 85 | Temp 98.1°F | Ht 69.0 in | Wt 397.0 lb

## 2017-01-12 DIAGNOSIS — Z794 Long term (current) use of insulin: Principal | ICD-10-CM

## 2017-01-12 DIAGNOSIS — E785 Hyperlipidemia, unspecified: Secondary | ICD-10-CM

## 2017-01-12 DIAGNOSIS — E1165 Type 2 diabetes mellitus with hyperglycemia: Principal | ICD-10-CM

## 2017-01-12 DIAGNOSIS — I10 Essential (primary) hypertension: Secondary | ICD-10-CM

## 2017-01-12 DIAGNOSIS — IMO0001 Reserved for inherently not codable concepts without codable children: Secondary | ICD-10-CM

## 2017-01-12 NOTE — Progress Notes (Signed)
BP 108/64 (BP Location: Left Arm, Patient Position: Sitting, Cuff Size: Large)   Pulse 85   Temp 98.1 F (36.7 C)   Ht  (1.753 m)   Wt (!) 397 lb (180.1 kg)   SpO2 98%   BMI 58.63 kg/m    Subjective:    Patient ID: Adam Long, male    DOB: [birth year], 41 y.o.   MRN: [id number]  HPI: Adam Long is a 41 y.o. male presenting on 01/12/2017 for Diabetes; Hyperlipidemia; and Hypertension   HPI Pt feeling fine today.  He has put on 20 pounds over the past year.    Relevant past medical, surgical, family and social history reviewed and updated as indicated. Interim medical history since our last visit reviewed. Allergies and medications reviewed and updated.   Current Outpatient Prescriptions:  .  insulin aspart (NOVOLOG) 100 UNIT/ML injection, Inject 2-8 Units into the skin 3 (three) times daily before meals., Disp: , Rfl:  .  insulin glargine (LANTUS) 100 UNIT/ML injection, Inject 100 Units into the skin at bedtime. , Disp: , Rfl:  .  JANUMET 50-500 MG tablet, TAKE 1 Tablet  BY MOUTH TWICE DAILY, Disp: 180 tablet, Rfl: 2 .  lisinopril (PRINIVIL,ZESTRIL) 20 MG tablet, TAKE ONE TABLET BY MOUTH ONCE DAILY FOR BLOOD PRESSURE, Disp: 30 tablet, Rfl: 4 .  loperamide (IMODIUM) 2 MG capsule, Take 4 mg by mouth as needed for diarrhea or loose stools., Disp: , Rfl:  .  lovastatin (MEVACOR) 20 MG tablet, TAKE ONE TABLET BY MOUTH AT BEDTIME FOR CHOLESTEROL, Disp: 30 tablet, Rfl: 4 .  metoprolol (LOPRESSOR) 50 MG tablet, TAKE ONE TABLET BY MOUTH TWICE DAILY FOR BLOOD PRESSURE, Disp: 60 tablet, Rfl: 4 .  Omega-3 Fatty Acids (FISH OIL) 1000 MG CAPS, Take 2 capsules by mouth daily., Disp: , Rfl:    Review of Systems  Constitutional: Negative for appetite change, chills, diaphoresis, fatigue, fever and unexpected weight change.  HENT: Positive for dental problem. Negative for congestion, drooling, ear pain, facial swelling, hearing loss, mouth sores, sneezing, sore throat, trouble  swallowing and voice change.   Eyes: Negative for pain, discharge, redness, itching and visual disturbance.  Respiratory: Negative for cough, choking, shortness of breath and wheezing.   Cardiovascular: Negative for chest pain, palpitations and leg swelling.  Gastrointestinal: Negative for abdominal pain, blood in stool, constipation, diarrhea and vomiting.  Endocrine: Negative for cold intolerance, heat intolerance and polydipsia.  Genitourinary: Negative for decreased urine volume, dysuria and hematuria.  Musculoskeletal: Negative for arthralgias, back pain and gait problem.  Skin: Negative for rash.  Allergic/Immunologic: Negative for environmental allergies.  Neurological: Negative for seizures, syncope, light-headedness and headaches.  Hematological: Negative for adenopathy.  Psychiatric/Behavioral: Positive for dysphoric mood. Negative for agitation and suicidal ideas. The patient is nervous/anxious.     Per HPI unless specifically indicated above     Objective:    BP 108/64 (BP Location: Left Arm, Patient Position: Sitting, Cuff Size: Large)   Pulse 85   Temp 98.1 F (36.7 C)   Ht  (1.753 m)   Wt (!) 397 lb (180.1 kg)   SpO2 98%   BMI 58.63 kg/m   Wt Readings from Last 3 Encounters:  01/12/17 (!) 397 lb (180.1 kg)  12/14/16 (!) 392 lb (177.8 kg)  11/01/16 (!) 385 lb 12 oz (175 kg)    Physical Exam  Constitutional: He is oriented to person, place, and time. He appears well-developed and well-nourished.  HENT:  Head:  Normocephalic and atraumatic.  Neck: Neck supple.  Cardiovascular: Normal rate and regular rhythm.   Pulmonary/Chest: Effort normal and breath sounds normal. He has no wheezes.  Abdominal: Soft. Bowel sounds are normal. There is no hepatosplenomegaly. There is no tenderness.  Musculoskeletal: He exhibits no edema.  Lymphadenopathy:    He has no cervical adenopathy.  Neurological: He is alert and oriented to person, place, and time.  Skin: Skin is  warm and dry.  Psychiatric: He has a normal mood and affect. His behavior is normal.  Vitals reviewed.   Results for orders placed or performed in visit on 01/03/17  Comprehensive metabolic panel  Result Value Ref Range   Sodium 133 (L) 135 - 146 mmol/L   Potassium 4.8 3.5 - 5.3 mmol/L   Chloride 97 (L) 98 - 110 mmol/L   CO2 27 20 - 31 mmol/L   Glucose, Bld 211 (H) 65 - 99 mg/dL   BUN 14 7 - 25 mg/dL   Creat 4.09 8.11 - 9.14 mg/dL   Total Bilirubin 0.7 0.2 - 1.2 mg/dL   Alkaline Phosphatase 46 40 - 115 U/L   AST 20 10 - 40 U/L   ALT 27 9 - 46 U/L   Total Protein 6.9 6.1 - 8.1 g/dL   Albumin 3.8 3.6 - 5.1 g/dL   Calcium 9.2 8.6 - 78.2 mg/dL  Lipid panel  Result Value Ref Range   Cholesterol 186 <200 mg/dL   Triglycerides 956 (H) <150 mg/dL   HDL 32 (L) >21 mg/dL   Total CHOL/HDL Ratio 5.8 (H) <5.0 Ratio   VLDL 54 (H) <30 mg/dL   LDL Cholesterol 308 (H) <100 mg/dL  Hemoglobin M5H  Result Value Ref Range   Hgb A1c MFr Bld 8.9 (H) <5.7 %   Mean Plasma Glucose 209 mg/dL      Assessment & Plan:    Encounter Diagnoses  Name Primary?  Marland Kitchen Uncontrolled type 2 diabetes mellitus without complication, with long-term current use of insulin (HCC) Yes  . Essential hypertension, benign   . Hyperlipidemia, unspecified hyperlipidemia type   . Morbid obesity, unspecified obesity type (HCC)     -reviewed labs with pt -will increase fish oil to 4 daily.  Pt to continue lowfat diet -pt is given [employer] discount application -will refer to endocrinology for uncontrolled diabetes -counseled pt about weight management and exercise.  Discussed gradually working himself up to walking 45-60 minutes 5-7 days/week -Follow up here in 3 months.  RTO sooner prn

## 2017-01-12 NOTE — Patient Instructions (Addendum)
Increase fish oil to 4 capsules daily Turn in your [employer] discount application  Exercise daily!  Gradually work up to walking 45-60 minutes 5-7 days each week.     Exercising to Stay Healthy Exercising regularly is important. It has many health benefits, such as:  Improving your overall fitness, flexibility, and endurance.  Increasing your bone density.  Helping with weight control.  Decreasing your body fat.  Increasing your muscle strength.  Reducing stress and tension.  Improving your overall health. In order to become healthy and stay healthy, it is recommended that you do moderate-intensity and vigorous-intensity exercise. You can tell that you are exercising at a moderate intensity if you have a higher heart rate and faster breathing, but you are still able to hold a conversation. You can tell that you are exercising at a vigorous intensity if you are breathing much harder and faster and cannot hold a conversation while exercising. How often should I exercise? Choose an activity that you enjoy and set realistic goals. Your health care provider can help you to make an activity plan that works for you. Exercise regularly as directed by your health care provider. This may include:  Doing resistance training twice each week, such as:  Push-ups.  Sit-ups.  Lifting weights.  Using resistance bands.  Doing a given intensity of exercise for a given amount of time. Choose from these options:  150 minutes of moderate-intensity exercise every week.  75 minutes of vigorous-intensity exercise every week.  A mix of moderate-intensity and vigorous-intensity exercise every week. Children, pregnant women, people who are out of shape, people who are overweight, and older adults may need to consult a health care provider for individual recommendations. If you have any sort of medical condition, be sure to consult your health care provider before starting a new exercise program. What are  some exercise ideas? Some moderate-intensity exercise ideas include:  Walking at a rate of 1 mile in 15 minutes.  Biking.  Hiking.  Golfing.  Dancing. Some vigorous-intensity exercise ideas include:  Walking at a rate of at least 4.5 miles per hour.  Jogging or running at a rate of 5 miles per hour.  Biking at a rate of at least 10 miles per hour.  Lap swimming.  Roller-skating or in-line skating.  Cross-country skiing.  Vigorous competitive sports, such as football, basketball, and soccer.  Jumping rope.  Aerobic dancing. What are some everyday activities that can help me to get exercise?  Yard work, such as:  Child psychotherapist.  Raking and bagging leaves.  Washing and waxing your car.  Pushing a stroller.  Shoveling snow.  Gardening.  Washing windows or floors. How can I be more active in my day-to-day activities?  Use the stairs instead of the elevator.  Take a walk during your lunch break.  If you drive, park your car farther away from work or school.  If you take public transportation, get off one stop early and walk the rest of the way.  Make all of your phone calls while standing up and walking around.  Get up, stretch, and walk around every 30 minutes throughout the day. What guidelines should I follow while exercising?  Do not exercise so much that you hurt yourself, feel dizzy, or get very short of breath.  Consult your health care provider before starting a new exercise program.  Wear comfortable clothes and shoes with good support.  Drink plenty of water while you exercise to prevent dehydration or  heat stroke. Body water is lost during exercise and must be replaced.  Work out until you breathe faster and your heart beats faster. This information is not intended to replace advice given to you by your health care provider. Make sure you discuss any questions you have with your health care provider. Document Released: 10/16/2010  Document Revised: 02/19/2016 Document Reviewed: 02/14/2014 Elsevier Interactive Patient Education  2017 [employer].

## 2017-02-07 ENCOUNTER — Ambulatory Visit (INDEPENDENT_AMBULATORY_CARE_PROVIDER_SITE_OTHER): Payer: Self-pay | Admitting: "Endocrinology

## 2017-02-07 ENCOUNTER — Encounter: Payer: Self-pay | Admitting: "Endocrinology

## 2017-02-07 VITALS — BP 129/84 | HR 76 | Ht 69.0 in | Wt 395.0 lb

## 2017-02-07 DIAGNOSIS — E1165 Type 2 diabetes mellitus with hyperglycemia: Secondary | ICD-10-CM

## 2017-02-07 DIAGNOSIS — E118 Type 2 diabetes mellitus with unspecified complications: Secondary | ICD-10-CM

## 2017-02-07 DIAGNOSIS — E782 Mixed hyperlipidemia: Secondary | ICD-10-CM

## 2017-02-07 LAB — GLUCOSE, POCT (MANUAL RESULT ENTRY): POC Glucose: 226 mg/dl — AB (ref 70–99)

## 2017-02-07 NOTE — Patient Instructions (Signed)

## 2017-02-07 NOTE — Progress Notes (Signed)
Subjective:    Patient ID: Adam Long, male    DOB: [birth year]. Patient is being seen in consultation for management of diabetes requested by  Jacquelin Hawking, PA-C  Past Medical History:  Diagnosis Date  . Anxiety   . Cholelithiasis   . Depression   . Diabetes mellitus   . Hypercholesteremia   . Hypertension   . Morbid obesity (HCC)    History reviewed. No pertinent surgical history. Social History   Social History  . Marital status: Single    Spouse name: N/A  . Number of children: N/A  . Years of education: N/A   Social History Main Topics  . Smoking status: Never Smoker  . Smokeless tobacco: Former Neurosurgeon    Types: Chew  . Alcohol use Yes     Comment: occasionally  . Drug use: No  . Sexual activity: Not Asked   Other Topics Concern  . None   Social History Narrative  . None   Outpatient Encounter Prescriptions as of 02/07/2017  Medication Sig  . insulin aspart (NOVOLOG) 100 UNIT/ML injection Inject 10-16 Units into the skin 3 (three) times daily before meals.  . insulin glargine (LANTUS) 100 UNIT/ML injection Inject 60 Units into the skin at bedtime.  Marland Kitchen JANUMET 50-500 MG tablet TAKE 1 Tablet  BY MOUTH TWICE DAILY  . lisinopril (PRINIVIL,ZESTRIL) 20 MG tablet TAKE ONE TABLET BY MOUTH ONCE DAILY FOR BLOOD PRESSURE  . loperamide (IMODIUM) 2 MG capsule Take 4 mg by mouth as needed for diarrhea or loose stools.  . lovastatin (MEVACOR) 20 MG tablet TAKE ONE TABLET BY MOUTH AT BEDTIME FOR CHOLESTEROL  . metoprolol (LOPRESSOR) 50 MG tablet TAKE ONE TABLET BY MOUTH TWICE DAILY FOR BLOOD PRESSURE  . Omega-3 Fatty Acids (FISH OIL) 1000 MG CAPS Take 2 capsules by mouth daily.   No facility-administered encounter medications on file as of 02/07/2017.    ALLERGIES: Allergies  Allergen Reactions  . Pollen Extract Other (See Comments)    Runny nose, watery eyes, sneezing   VACCINATION STATUS: Immunization History  Administered Date(s) Administered  . Influenza  Split 07/01/2015, 06/30/2016    Diabetes  He presents for his initial diabetic visit. He has type 2 diabetes mellitus. Onset time: He was diagnosed at approximate age of 35 years. His disease course has been worsening. There are no hypoglycemic associated symptoms. Pertinent negatives for hypoglycemia include no confusion, headaches, pallor or seizures. Associated symptoms include blurred vision, polydipsia and polyuria. Pertinent negatives for diabetes include no chest pain, no fatigue, no polyphagia and no weakness. There are no hypoglycemic complications. Symptoms are worsening. There are no diabetic complications. Risk factors for coronary artery disease include diabetes mellitus, dyslipidemia, family history, obesity, male sex, hypertension and sedentary lifestyle. Current diabetic treatment includes intensive insulin program and oral agent (dual therapy). His weight is increasing steadily. He is following a generally unhealthy diet. When asked about meal planning, he reported none. He has not had a previous visit with a dietitian. He never participates in exercise. His overall blood glucose range is 180-200 mg/dl. (He brought her log showing persistently above target blood glucose readings averaging between 180 and 200.) An ACE inhibitor/angiotensin II receptor blocker is being taken. Eye exam is current.  Hyperlipidemia  This is a chronic problem. The current episode started more than 1 year ago. The problem is uncontrolled. Recent lipid tests were reviewed and are high. Exacerbating diseases include diabetes and obesity. Pertinent negatives include no chest pain, myalgias or  shortness of breath. Current antihyperlipidemic treatment includes statins. Risk factors for coronary artery disease include diabetes mellitus, dyslipidemia, hypertension, male sex, obesity, a sedentary lifestyle and family history.  Hypertension  This is a chronic problem. The current episode started more than 1 year ago. The  problem is controlled. Associated symptoms include blurred vision. Pertinent negatives include no chest pain, headaches, neck pain, palpitations or shortness of breath. Risk factors for coronary artery disease include diabetes mellitus, dyslipidemia, male gender, obesity and sedentary lifestyle. Past treatments include angiotensin blockers.       Review of Systems  Constitutional: Negative for chills, fatigue, fever and unexpected weight change.  HENT: Negative for dental problem, mouth sores and trouble swallowing.   Eyes: Positive for blurred vision. Negative for visual disturbance.  Respiratory: Negative for cough, choking, chest tightness, shortness of breath and wheezing.   Cardiovascular: Negative for chest pain, palpitations and leg swelling.  Gastrointestinal: Negative for abdominal distention, abdominal pain, constipation, diarrhea, nausea and vomiting.  Endocrine: Positive for polydipsia and polyuria. Negative for polyphagia.  Genitourinary: Negative for dysuria, flank pain, hematuria and urgency.  Musculoskeletal: Negative for back pain, gait problem, myalgias and neck pain.  Skin: Negative for pallor, rash and wound.  Neurological: Negative for seizures, syncope, weakness, numbness and headaches.  Psychiatric/Behavioral: Negative.  Negative for confusion and dysphoric mood.    Objective:    BP 129/84   Pulse 76   Ht 5\' 9"  (1.753 m)   Wt (!) 395 lb (179.2 kg)   BMI 58.33 kg/m   Wt Readings from Last 3 Encounters:  02/07/17 (!) 395 lb (179.2 kg)  01/12/17 (!) 397 lb (180.1 kg)  12/14/16 (!) 392 lb (177.8 kg)    Physical Exam  Constitutional: He is oriented to person, place, and time. He is cooperative. No distress.  Morbidly obese, in no acute distress  HENT:  Head: Normocephalic and atraumatic.  Eyes: EOM are normal.  Neck: Normal range of motion. Neck supple. No tracheal deviation present. No thyromegaly present.  Cardiovascular: Normal rate, S1 normal, S2 normal  and normal heart sounds.  Exam reveals no gallop.   No murmur heard. Pulses:      Dorsalis pedis pulses are 1+ on the right side, and 1+ on the left side.       Posterior tibial pulses are 1+ on the right side, and 1+ on the left side.  Pulmonary/Chest: Breath sounds normal. No respiratory distress. He has no wheezes.  Abdominal: Soft. Bowel sounds are normal. He exhibits no distension. There is no tenderness. There is no guarding and no CVA tenderness.  Musculoskeletal: Normal range of motion. He exhibits no edema.       Right shoulder: He exhibits no swelling and no deformity.  Neurological: He is alert and oriented to person, place, and time. He has normal strength and normal reflexes. No cranial nerve deficit or sensory deficit. Gait normal.  Skin: Skin is warm and dry. No rash noted. No cyanosis. Nails show no clubbing.  Psychiatric: He has a normal mood and affect. His speech is normal and behavior is normal. Cognition and memory are normal.     CMP ( most recent) CMP     Component Value Date/Time   NA 133 (L) 01/07/2017 1040   K 4.8 01/07/2017 1040   CL 97 (L) 01/07/2017 1040   CO2 27 01/07/2017 1040   GLUCOSE 211 (H) 01/07/2017 1040   BUN 14 01/07/2017 1040   CREATININE 0.77 01/07/2017 1040   CALCIUM  9.2 01/07/2017 1040   PROT 6.9 01/07/2017 1040   ALBUMIN 3.8 01/07/2017 1040   AST 20 01/07/2017 1040   ALT 27 01/07/2017 1040   ALKPHOS 46 01/07/2017 1040   BILITOT 0.7 01/07/2017 1040   GFRNONAA >89 04/08/2016 0903   GFRAA >89 04/08/2016 0903     Diabetic Labs (most recent): Lab Results  Component Value Date   HGBA1C 8.9 (H) 01/07/2017   HGBA1C 8.9 (H) 10/25/2016   HGBA1C 10.3 (H) 07/20/2016     Lipid Panel ( most recent) Lipid Panel     Component Value Date/Time   CHOL 186 01/07/2017 1039   TRIG 271 (H) 01/07/2017 1039   HDL 32 (L) 01/07/2017 1039   CHOLHDL 5.8 (H) 01/07/2017 1039   VLDL 54 (H) 01/07/2017 1039   LDLCALC 100 (H) 01/07/2017 1039        Assessment & Plan:   1. Uncontrolled type 2 diabetes mellitus with complication, unspecified whether long term insulin use (HCC)  - Patient has currently uncontrolled symptomatic type 2 DM since  41 years of age,  with most recent A1c of 8.9%. Recent labs reviewed.   His diabetes is complicated by obesity/sedentary life, lack of proper insurance for medications and doctors visits and patient remains at a high risk for more acute and chronic complications of diabetes which include CAD, CVA, CKD, retinopathy, and neuropathy. These are all discussed in detail with the patient.  - I have counseled the patient on diet management and weight loss, by adopting a carbohydrate restricted/protein rich diet.  - Suggestion is made for patient to avoid simple carbohydrates   from their diet including Cakes , Desserts, Ice Cream,  Soda (  diet and regular) , Sweet Tea , Candies,  Chips, Cookies, Artificial Sweeteners,   and "Sugar-free" Products . This will help patient to have stable blood glucose profile and potentially avoid unintended weight gain.  - I encouraged the patient to switch to  unprocessed or minimally processed complex starch and increased protein intake (animal or plant source), fruits, and vegetables.  - Patient is advised to stick to a routine mealtimes to eat 3 meals  a day and avoid unnecessary snacks ( to snack only to correct hypoglycemia).  - The patient will be scheduled with Norm Salt, RDN, CDE for individualized DM education.  - I have approached patient with the following individualized plan to manage diabetes and patient agrees:   - I  will proceed to readjust his basal insulin Lantus to 60 units daily at bedtime, increase his prandial insulin NovoLog to 10 units 3 times a day before meals for pre-meal BG readings of 90-150mg /dl, plus patient specific correction dose for unexpected hyperglycemia above 150mg /dl, associated with strict monitoring of glucose 4 times a  day-before meals and at bedtime.  - Patient is warned not to take insulin without proper monitoring per orders. -Adjustment parameters are given for hypo and hyperglycemia in writing. -Patient is encouraged to call clinic for blood glucose levels less than 70 or above 300 mg /dl. - I will continue Janumet 50/500 minute grams by mouth twice a day, therapeutically suitable for patient..  - Patient will be considered for incretin therapy as appropriate next visit. - Patient specific target  A1c;  LDL, HDL, Triglycerides, and  Waist Circumference were discussed in detail.  2) BP/HTN:controlled. Continue current medications including ACEI/ARB. 3) Lipids/HPL:    uncontrolled, LDL 100, triglycerides 271.   Patient is advised tocontinue statins. 4)  Weight/Diet: CDE Consult will  be initiated , exercise, and detailed carbohydrates information provided.  5) Chronic Care/Health Maintenance:  -Patient is on ACEI/ARB and Statin medications and encouraged to continue to follow up with Ophthalmology, Podiatrist at least yearly or according to recommendations, and advised to   stay away from smoking. I have recommended yearly flu vaccine and pneumonia vaccination at least every 5 years; moderate intensity exercise for up to 150 minutes weekly; and  sleep for at least 7 hours a day.  - 60 minutes of time was spent on the care of this patient , 50% of which was applied for counseling on diabetes complications and their preventions.  - Patient to bring meter and  blood glucose logs during his next visit.   - I advised patient to maintain close follow up with Jacquelin HawkingMcElroy, Shannon, PA-C for primary care needs.  Follow up plan: - Return in about 1 week (around 02/14/2017) for follow up with meter and logs- no labs.  Marquis LunchGebre Corbyn Steedman, MD Phone: [phone]  Fax: [phone]   02/07/2017, 10:19 AM

## 2017-02-14 ENCOUNTER — Ambulatory Visit (INDEPENDENT_AMBULATORY_CARE_PROVIDER_SITE_OTHER): Payer: Self-pay | Admitting: "Endocrinology

## 2017-02-14 ENCOUNTER — Encounter: Payer: Self-pay | Admitting: "Endocrinology

## 2017-02-14 VITALS — BP 121/81 | HR 80 | Ht 69.0 in | Wt 393.0 lb

## 2017-02-14 DIAGNOSIS — E1165 Type 2 diabetes mellitus with hyperglycemia: Secondary | ICD-10-CM

## 2017-02-14 DIAGNOSIS — E782 Mixed hyperlipidemia: Secondary | ICD-10-CM

## 2017-02-14 DIAGNOSIS — E118 Type 2 diabetes mellitus with unspecified complications: Secondary | ICD-10-CM

## 2017-02-14 NOTE — Patient Instructions (Signed)

## 2017-02-14 NOTE — Progress Notes (Signed)
Subjective:    Patient ID: Adam Long, male    DOB: 01/23/1976. Patient is being seen in consultation for management of diabetes requested by  Jacquelin Hawking, PA-C  Past Medical History:  Diagnosis Date  . Anxiety   . Cholelithiasis   . Depression   . Diabetes mellitus   . Hypercholesteremia   . Hypertension   . Morbid obesity (HCC)    No past surgical history on file. Social History   Social History  . Marital status: Single    Spouse name: N/A  . Number of children: N/A  . Years of education: N/A   Social History Main Topics  . Smoking status: Never Smoker  . Smokeless tobacco: Former Neurosurgeon    Types: Chew  . Alcohol use Yes     Comment: occasionally  . Drug use: No  . Sexual activity: Not on file   Other Topics Concern  . Not on file   Social History Narrative  . No narrative on file   Outpatient Encounter Prescriptions as of 02/14/2017  Medication Sig  . insulin aspart (NOVOLOG) 100 UNIT/ML injection Inject 15-21 Units into the skin 3 (three) times daily before meals.  . insulin glargine (LANTUS) 100 UNIT/ML injection Inject 70 Units into the skin at bedtime.  Marland Kitchen JANUMET 50-500 MG tablet TAKE 1 Tablet  BY MOUTH TWICE DAILY  . lisinopril (PRINIVIL,ZESTRIL) 20 MG tablet TAKE ONE TABLET BY MOUTH ONCE DAILY FOR BLOOD PRESSURE  . loperamide (IMODIUM) 2 MG capsule Take 4 mg by mouth as needed for diarrhea or loose stools.  . lovastatin (MEVACOR) 20 MG tablet TAKE ONE TABLET BY MOUTH AT BEDTIME FOR CHOLESTEROL  . metoprolol (LOPRESSOR) 50 MG tablet TAKE ONE TABLET BY MOUTH TWICE DAILY FOR BLOOD PRESSURE  . Omega-3 Fatty Acids (FISH OIL) 1000 MG CAPS Take 2 capsules by mouth daily.   No facility-administered encounter medications on file as of 02/14/2017.    ALLERGIES: Allergies  Allergen Reactions  . Pollen Extract Other (See Comments)    Runny nose, watery eyes, sneezing   VACCINATION STATUS: Immunization History  Administered Date(s) Administered  .  Influenza Split 07/01/2015, 06/30/2016    Diabetes  He presents for his follow-up diabetic visit. He has type 2 diabetes mellitus. Onset time: He was diagnosed at approximate age of 35 years. His disease course has been worsening. There are no hypoglycemic associated symptoms. Pertinent negatives for hypoglycemia include no confusion, headaches, pallor or seizures. Associated symptoms include blurred vision, polydipsia and polyuria. Pertinent negatives for diabetes include no chest pain, no fatigue, no polyphagia and no weakness. There are no hypoglycemic complications. Symptoms are worsening. There are no diabetic complications. Risk factors for coronary artery disease include diabetes mellitus, dyslipidemia, family history, obesity, male sex, hypertension and sedentary lifestyle. Current diabetic treatment includes intensive insulin program and oral agent (dual therapy). His weight is stable. He is following a generally unhealthy diet. When asked about meal planning, he reported none. He has not had a previous visit with a dietitian. He never participates in exercise. His breakfast blood glucose range is generally 180-200 mg/dl. His lunch blood glucose range is generally 180-200 mg/dl. His dinner blood glucose range is generally 180-200 mg/dl. His overall blood glucose range is 180-200 mg/dl. An ACE inhibitor/angiotensin II receptor blocker is being taken. Eye exam is current.  Hyperlipidemia  This is a chronic problem. The current episode started more than 1 year ago. The problem is uncontrolled. Recent lipid tests were reviewed and  are high. Exacerbating diseases include diabetes and obesity. Pertinent negatives include no chest pain, myalgias or shortness of breath. Current antihyperlipidemic treatment includes statins. Risk factors for coronary artery disease include diabetes mellitus, dyslipidemia, hypertension, male sex, obesity, a sedentary lifestyle and family history.  Hypertension  This is a  chronic problem. The current episode started more than 1 year ago. The problem is controlled. Associated symptoms include blurred vision. Pertinent negatives include no chest pain, headaches, neck pain, palpitations or shortness of breath. Risk factors for coronary artery disease include diabetes mellitus, dyslipidemia, male gender, obesity and sedentary lifestyle. Past treatments include angiotensin blockers.     Review of Systems  Constitutional: Negative for chills, fatigue, fever and unexpected weight change.  HENT: Negative for dental problem, mouth sores and trouble swallowing.   Eyes: Positive for blurred vision. Negative for visual disturbance.  Respiratory: Negative for cough, choking, chest tightness, shortness of breath and wheezing.   Cardiovascular: Negative for chest pain, palpitations and leg swelling.  Gastrointestinal: Negative for abdominal distention, abdominal pain, constipation, diarrhea, nausea and vomiting.  Endocrine: Positive for polydipsia and polyuria. Negative for polyphagia.  Genitourinary: Negative for dysuria, flank pain, hematuria and urgency.  Musculoskeletal: Negative for back pain, gait problem, myalgias and neck pain.  Skin: Negative for pallor, rash and wound.  Neurological: Negative for seizures, syncope, weakness, numbness and headaches.  Psychiatric/Behavioral: Negative.  Negative for confusion and dysphoric mood.    Objective:    BP 121/81   Pulse 80   Ht 5\' 9"  (1.753 m)   Wt (!) 393 lb (178.3 kg)   BMI 58.04 kg/m   Wt Readings from Last 3 Encounters:  02/14/17 (!) 393 lb (178.3 kg)  02/07/17 (!) 395 lb (179.2 kg)  01/12/17 (!) 397 lb (180.1 kg)    Physical Exam  Constitutional: He is oriented to person, place, and time. He is cooperative. No distress.  Morbidly obese, in no acute distress  HENT:  Head: Normocephalic and atraumatic.  Eyes: EOM are normal.  Neck: Normal range of motion. Neck supple. No tracheal deviation present. No  thyromegaly present.  Cardiovascular: Normal rate, S1 normal, S2 normal and normal heart sounds.  Exam reveals no gallop.   No murmur heard. Pulses:      Dorsalis pedis pulses are 1+ on the right side, and 1+ on the left side.       Posterior tibial pulses are 1+ on the right side, and 1+ on the left side.  Pulmonary/Chest: Breath sounds normal. No respiratory distress. He has no wheezes.  Abdominal: Soft. Bowel sounds are normal. He exhibits no distension. There is no tenderness. There is no guarding and no CVA tenderness.  Musculoskeletal: Normal range of motion. He exhibits no edema.       Right shoulder: He exhibits no swelling and no deformity.  Neurological: He is alert and oriented to person, place, and time. He has normal strength and normal reflexes. No cranial nerve deficit or sensory deficit. Gait normal.  Skin: Skin is warm and dry. No rash noted. No cyanosis. Nails show no clubbing.  Psychiatric: He has a normal mood and affect. His speech is normal and behavior is normal. Cognition and memory are normal.     CMP ( most recent) CMP     Component Value Date/Time   NA 133 (L) 01/07/2017 1040   K 4.8 01/07/2017 1040   CL 97 (L) 01/07/2017 1040   CO2 27 01/07/2017 1040   GLUCOSE 211 (H) 01/07/2017 1040  BUN 14 01/07/2017 1040   CREATININE 0.77 01/07/2017 1040   CALCIUM 9.2 01/07/2017 1040   PROT 6.9 01/07/2017 1040   ALBUMIN 3.8 01/07/2017 1040   AST 20 01/07/2017 1040   ALT 27 01/07/2017 1040   ALKPHOS 46 01/07/2017 1040   BILITOT 0.7 01/07/2017 1040   GFRNONAA >89 04/08/2016 0903   GFRAA >89 04/08/2016 0903     Diabetic Labs (most recent): Lab Results  Component Value Date   HGBA1C 8.9 (H) 01/07/2017   HGBA1C 8.9 (H) 10/25/2016   HGBA1C 10.3 (H) 07/20/2016     Lipid Panel ( most recent) Lipid Panel     Component Value Date/Time   CHOL 186 01/07/2017 1039   TRIG 271 (H) 01/07/2017 1039   HDL 32 (L) 01/07/2017 1039   CHOLHDL 5.8 (H) 01/07/2017 1039    VLDL 54 (H) 01/07/2017 1039   LDLCALC 100 (H) 01/07/2017 1039       Assessment & Plan:   1. Uncontrolled type 2 diabetes mellitus with complication, unspecified whether long term insulin use (HCC)  - Patient has currently uncontrolled symptomatic type 2 DM since  41 years of age,  with most recent A1c of 8.9%. Recent labs reviewed.   His diabetes is complicated by obesity/sedentary life, lack of proper insurance for medications and doctors visits and patient remains at a high risk for more acute and chronic complications of diabetes which include CAD, CVA, CKD, retinopathy, and neuropathy. These are all discussed in detail with the patient.  - I have counseled the patient on diet management and weight loss, by adopting a carbohydrate restricted/protein rich diet.  - Suggestion is made for patient to avoid simple carbohydrates   from their diet including Cakes , Desserts, Ice Cream,  Soda (  diet and regular) , Sweet Tea , Candies,  Chips, Cookies, Artificial Sweeteners,   and "Sugar-free" Products . This will help patient to have stable blood glucose profile and potentially avoid unintended weight gain.  - I encouraged the patient to switch to  unprocessed or minimally processed complex starch and increased protein intake (animal or plant source), fruits, and vegetables.  - Patient is advised to stick to a routine mealtimes to eat 3 meals  a day and avoid unnecessary snacks ( to snack only to correct hypoglycemia).  - The patient will be scheduled with Norm SaltPenny Crumpton, RDN, CDE for individualized DM education.  - I have approached patient with the following individualized plan to manage diabetes and patient agrees:   - I  will proceed to readjust his basal insulin Lantus to 70 units daily at bedtime, increase his prandial insulin NovoLog to 15 units 3 times a day before meals for pre-meal BG readings of 90-150mg /dl, plus patient specific correction dose for unexpected hyperglycemia above  150mg /dl, associated with strict monitoring of glucose 4 times a day-before meals and at bedtime.  - Patient is warned not to take insulin without proper monitoring per orders. -Adjustment parameters are given for hypo and hyperglycemia in writing. -Patient is encouraged to call clinic for blood glucose levels less than 70 or above 300 mg /dl. - I will continue Janumet 50/500 minute grams by mouth twice a day, therapeutically suitable for patient..  - Patient will be considered for incretin therapy as appropriate next visit. - Patient specific target  A1c;  LDL, HDL, Triglycerides, and  Waist Circumference were discussed in detail.  2) BP/HTN:controlled. Continue current medications including ACEI/ARB. 3) Lipids/HPL:    uncontrolled, LDL 100, triglycerides 271.  Patient is advised tocontinue statins. 4)  Weight/Diet: CDE Consult has been initiated , exercise, and detailed carbohydrates information provided.  5) Chronic Care/Health Maintenance:  -Patient is on ACEI/ARB and Statin medications and encouraged to continue to follow up with Ophthalmology, Podiatrist at least yearly or according to recommendations, and advised to   stay away from smoking. I have recommended yearly flu vaccine and pneumonia vaccination at least every 5 years; moderate intensity exercise for up to 150 minutes weekly; and  sleep for at least 7 hours a day.  - 30 minutes of time was spent on the care of this patient , 50% of which was applied for counseling on diabetes complications and their preventions.  - Patient to bring meter and  blood glucose logs during his next visit.   - I advised patient to maintain close follow up with Jacquelin Hawking, PA-C for primary care needs.  Follow up plan: - Return in about 3 months (around 05/17/2017) for follow up with pre-visit labs, meter, and logs.  Marquis Lunch, MD Phone: [phone]  Fax: [phone]   02/14/2017, 11:59 AM

## 2017-02-22 ENCOUNTER — Other Ambulatory Visit: Payer: Self-pay | Admitting: Physician Assistant

## 2017-02-24 ENCOUNTER — Other Ambulatory Visit: Payer: Self-pay | Admitting: Physician Assistant

## 2017-02-24 MED ORDER — LOVASTATIN 20 MG PO TABS: ORAL_TABLET | ORAL | 4 refills | Status: DC

## 2017-02-27 ENCOUNTER — Other Ambulatory Visit: Payer: Self-pay | Admitting: Physician Assistant

## 2017-03-03 ENCOUNTER — Encounter: Payer: Self-pay | Admitting: Physician Assistant

## 2017-03-03 NOTE — Progress Notes (Signed)
[employer] Patient Assistance Program Application was faxed for patient to [phone] on 10-04-16. Pt was denied due to possibly able to get Medicaid. Pt is to apply and get denied for Medicaid. [facility] and begin receiving insulin.  Pt was notified on 11-01-16 and 03-01-17. Pt stated he already applied for Medicaid and was waiting on denial letter.

## 2017-03-08 ENCOUNTER — Encounter: Payer: Self-pay | Attending: "Endocrinology | Admitting: Nutrition

## 2017-03-08 ENCOUNTER — Encounter: Payer: Self-pay | Admitting: Nutrition

## 2017-03-08 VITALS — Ht 70.0 in | Wt 395.0 lb

## 2017-03-08 DIAGNOSIS — E1165 Type 2 diabetes mellitus with hyperglycemia: Secondary | ICD-10-CM

## 2017-03-08 DIAGNOSIS — IMO0002 Reserved for concepts with insufficient information to code with codable children: Secondary | ICD-10-CM

## 2017-03-08 DIAGNOSIS — Z794 Long term (current) use of insulin: Principal | ICD-10-CM

## 2017-03-08 DIAGNOSIS — E118 Type 2 diabetes mellitus with unspecified complications: Principal | ICD-10-CM

## 2017-03-08 NOTE — Progress Notes (Signed)
Diabetes Self-Management Education  Visit Type: First/Initial  Appt. Start Time: 0800 Appt. End Time: 900  03/08/2017  [name], identified by name and date of birth, is a 41 y.o. male with a diagnosis of Diabetes: Type 2. He lives with his uncle. He doesn't work. Trying to get Medicaid. Checks BS 4 times per day. 70 units Lantus and 15 units of Novolog with meals plus sliding scale. Never met with a RD/CDE before. Motivated to make changes. Fair support system.  ASSESSMENT  Height _0  (1.778 m), weight (!) 395 lb (179.2 kg). Body mass index is 56.68 kg/m.      Diabetes Self-Management Education - 03/08/17 0806      Visit Information   Visit Type First/Initial     Initial Visit   Diabetes Type Type 2   Are you currently following a meal plan? Yes   What type of meal plan do you follow? low carb low sugar   Are you taking your medications as prescribed? Yes   Date Diagnosed 2013     Health Coping   How would you rate your overall health? Fair     Psychosocial Assessment   Patient Belief/Attitude about Diabetes Motivated to manage diabetes   Self-care barriers None   Patient Concerns Nutrition/Meal planning;Monitoring;Healthy Lifestyle;Glycemic Control   Special Needs None   Preferred Learning Style No preference indicated   Learning Readiness Ready   How often do you need to have someone help you when you read instructions, pamphlets, or other written materials from your doctor or pharmacy? 1 - Never   What is the last grade level you completed in school? 12     Complications   How often do you check your blood sugar? 3-4 times/day   Fasting Blood glucose range (mg/dL) 180-200   Postprandial Blood glucose range (mg/dL) 180-200   Number of hypoglycemic episodes per month 0   Number of hyperglycemic episodes per week 10   Have you had a dilated eye exam in the past 12 months? Yes   Have you had a dental exam in the past 12 months? No   Are you checking your  feet? Yes   How many days per week are you checking your feet? 5     Dietary Intake   Breakfast Cherrios with 2% milk,    Snack (morning) sometimes:  fruit   Lunch Toss salad with chicken, Water   Dinner Steak and chicken fijitas, 2 wraps, Diet Pepsi   Beverage(s) water, diet sodas     Exercise   Exercise Type Light (walking / raking leaves)   How many days per week to you exercise? 4   How many minutes per day do you exercise? 30   Total minutes per week of exercise 120     Patient Education   Previous Diabetes Education No   Disease state  Definition of diabetes, type 1 and 2, and the diagnosis of diabetes;Factors that contribute to the development of diabetes;Explored patient's options for treatment of their diabetes   Nutrition management  Role of diet in the treatment of diabetes and the relationship between the three main macronutrients and blood glucose level;Food label reading, portion sizes and measuring food.;Carbohydrate counting;Meal timing in regards to the patients' current diabetes medication.;Information on hints to eating out and maintain blood glucose control.   Physical activity and exercise  Role of exercise on diabetes management, blood pressure control and cardiac health.;Identified with patient nutritional and/or medication changes necessary with exercise.;Helped  patient identify appropriate exercises in relation to his/her diabetes, diabetes complications and other health issue.   Medications Reviewed patients medication for diabetes, action, purpose, timing of dose and side effects.;Taught/reviewed insulin injection, site rotation, insulin storage and needle disposal.   Monitoring Taught/evaluated SMBG meter.;Purpose and frequency of SMBG.;Daily foot exams;Yearly dilated eye exam;Interpreting lab values - A1C, lipid, urine microalbumina.   Acute complications Taught treatment of hypoglycemia - the 15 rule.;Discussed and identified patients' treatment of hyperglycemia.    Chronic complications Relationship between chronic complications and blood glucose control;Identified and discussed with patient  current chronic complications   Psychosocial adjustment Worked with patient to identify barriers to care and solutions;Role of stress on diabetes;Helped patient identify a support system for diabetes management;Brainstormed with patient on coping mechanisms for social situations, getting support from significant others, dealing with feelings about diabetes;Identified and addressed patients feelings and concerns about diabetes   Personal strategies to promote health Lifestyle issues that need to be addressed for better diabetes care;Helped patient develop diabetes management plan for (enter comment)     Individualized Goals (developed by patient)   Nutrition Follow meal plan discussed;General guidelines for healthy choices and portions discussed   Physical Activity Exercise 5-7 days per week;30 minutes per day   Medications take my medication as prescribed   Monitoring  test my blood glucose as discussed;test blood glucose pre and post meals as discussed   Reducing Risk examine blood glucose patterns     Post-Education Assessment   Patient understands the diabetes disease and treatment process. Needs Review   Patient understands incorporating nutritional management into lifestyle. Needs Review   Patient undertands incorporating physical activity into lifestyle. Needs Review   Patient understands using medications safely. Needs Review   Patient understands monitoring blood glucose, interpreting and using results Needs Review   Patient understands prevention, detection, and treatment of acute complications. Needs Review   Patient understands prevention, detection, and treatment of chronic complications. Needs Review   Patient understands how to develop strategies to address psychosocial issues. Needs Review   Patient understands how to develop strategies to promote  health/change behavior. Needs Review     Outcomes   Expected Outcomes Demonstrated interest in learning. Expect positive outcomes   Future DMSE 4-6 wks   Program Status Completed      Individualized Plan for Diabetes Self-Management Training:   Learning Objective:  Patient will have a greater understanding of diabetes self-management. Patient education plan is to attend individual and/or group sessions per assessed needs and concerns.   Plan:   Patient Instructions  Goals 1. Follow the Plate Method 2. Eat 3-4 choice per meal 3. Eat meals on time 4. Cut out diet sodas 5. Exercise 30 minutes 5 days a week. Lose 1-2 lbs per week Get A1C down to 7%.    Expected Outcomes:  Demonstrated interest in learning. Expect positive outcomes  Education material provided: Living Well with Diabetes, Food label handouts, A1C conversion sheet, Meal plan card, My Plate and Carbohydrate counting sheet  If problems or questions, patient to contact team via:  Phone and Email  Future DSME appointment: 4-6 wks

## 2017-03-08 NOTE — Patient Instructions (Signed)
Goals 1. Follow the Plate Method 2. Eat 3-4 choice per meal 3. Eat meals on time 4. Cut out diet sodas 5. Exercise 30 minutes 5 days a week. Lose 1-2 lbs per week Get A1C down to 7%.

## 2017-03-09 ENCOUNTER — Other Ambulatory Visit: Payer: Self-pay | Admitting: Physician Assistant

## 2017-03-09 MED ORDER — LISINOPRIL 20 MG PO TABS: ORAL_TABLET | ORAL | 3 refills | Status: DC

## 2017-03-09 MED ORDER — METOPROLOL TARTRATE 50 MG PO TABS: ORAL_TABLET | ORAL | 3 refills | Status: DC

## 2017-03-15 ENCOUNTER — Other Ambulatory Visit: Payer: Self-pay | Admitting: Physician Assistant

## 2017-03-15 DIAGNOSIS — E785 Hyperlipidemia, unspecified: Secondary | ICD-10-CM

## 2017-03-17 ENCOUNTER — Other Ambulatory Visit: Payer: Self-pay | Admitting: Physician Assistant

## 2017-03-17 MED ORDER — LOVASTATIN 20 MG PO TABS: ORAL_TABLET | ORAL | 4 refills | Status: DC

## 2017-04-07 ENCOUNTER — Other Ambulatory Visit (HOSPITAL_COMMUNITY)
Admission: RE | Admit: 2017-04-07 | Discharge: 2017-04-07 | Disposition: A | Discharge status: Home / Self Care | Payer: Self-pay | Source: Ambulatory Visit | Attending: Physician Assistant | Admitting: Physician Assistant

## 2017-04-07 DIAGNOSIS — E785 Hyperlipidemia, unspecified: Secondary | ICD-10-CM | POA: Insufficient documentation

## 2017-04-07 LAB — LIPID PANEL
CHOL/HDL RATIO: 4.6 ratio
Cholesterol: 158 mg/dL (ref 0–200)
HDL: 34 mg/dL — ABNORMAL LOW (ref >40)
LDL CALC: 65 mg/dL (ref 0–99)
Triglycerides: 296 mg/dL — ABNORMAL HIGH (ref <150)
VLDL: 59 mg/dL — ABNORMAL HIGH (ref 0–40)

## 2017-04-12 ENCOUNTER — Ambulatory Visit: Payer: Self-pay | Admitting: Nutrition

## 2017-04-13 ENCOUNTER — Encounter: Payer: Self-pay | Admitting: Physician Assistant

## 2017-04-13 ENCOUNTER — Ambulatory Visit: Payer: Self-pay | Admitting: Physician Assistant

## 2017-04-13 VITALS — BP 100/62 | HR 87 | Temp 97.9°F | Ht 69.0 in | Wt 390.2 lb

## 2017-04-13 DIAGNOSIS — E1165 Type 2 diabetes mellitus with hyperglycemia: Secondary | ICD-10-CM

## 2017-04-13 DIAGNOSIS — I10 Essential (primary) hypertension: Secondary | ICD-10-CM

## 2017-04-13 DIAGNOSIS — IMO0001 Reserved for inherently not codable concepts without codable children: Secondary | ICD-10-CM

## 2017-04-13 DIAGNOSIS — Z794 Long term (current) use of insulin: Secondary | ICD-10-CM

## 2017-04-13 DIAGNOSIS — E785 Hyperlipidemia, unspecified: Secondary | ICD-10-CM

## 2017-04-13 DIAGNOSIS — Z6841 Body Mass Index (BMI) 40.0 and over, adult: Secondary | ICD-10-CM

## 2017-04-13 NOTE — Progress Notes (Signed)
BP 100/62 (BP Location: Left Arm, Patient Position: Sitting, Cuff Size: Large)   Pulse 87   Temp 97.9 F (36.6 C)   Ht 5\' 9"  (1.753 m)   Wt (!) 390 lb 4 oz (177 kg)   SpO2 97%   BMI 57.63 kg/m    Subjective:    Patient ID: Adam Long, male    DOB: 12-31-1975, 41 y.o.   MRN: [id number]  HPI: Adam Long is a 41 y.o. male presenting on 04/13/2017 for Hyperlipidemia and Diabetes   HPI   Pt is seeing dr Fransico HimNida for his uncontrolled diabetes.    He says he is doing well.   Pt says he walks usually 3 days/week.  He says for about 20 minutes.  Pt has lost 7 pounds since his OV in April.    Relevant past medical, surgical, family and social history reviewed and updated as indicated. Interim medical history since our last visit reviewed. Allergies and medications reviewed and updated.   Current Outpatient Prescriptions:  .  insulin aspart (NOVOLOG) 100 UNIT/ML injection, Inject 15-21 Units into the skin 3 (three) times daily before meals., Disp: , Rfl:  .  insulin glargine (LANTUS) 100 UNIT/ML injection, Inject 70 Units into the skin at bedtime., Disp: , Rfl:  .  JANUMET 50-500 MG tablet, TAKE 1 Tablet  BY MOUTH TWICE DAILY, Disp: 180 tablet, Rfl: 3 .  lisinopril (PRINIVIL,ZESTRIL) 20 MG tablet, TAKE ONE TABLET BY MOUTH ONCE DAILY FOR BLOOD PRESSURE, Disp: 90 tablet, Rfl: 3 .  loperamide (IMODIUM) 2 MG capsule, Take 4 mg by mouth as needed for diarrhea or loose stools., Disp: , Rfl:  .  lovastatin (MEVACOR) 20 MG tablet, TAKE ONE TABLET BY MOUTH AT BEDTIME FOR CHOLESTEROL, Disp: 30 tablet, Rfl: 4 .  metoprolol tartrate (LOPRESSOR) 50 MG tablet, TAKE 1 TABLET BY MOUTH TWICE DAILY FOR BLOOD PRESSURE, Disp: 180 tablet, Rfl: 3 .  Omega-3 Fatty Acids (FISH OIL) 1000 MG CAPS, Take 2 capsules by mouth 2 (two) times daily. , Disp: , Rfl:    Review of Systems  Constitutional: Negative for appetite change, chills, diaphoresis, fatigue, fever and unexpected weight change.  HENT:  Positive for dental problem. Negative for congestion, drooling, ear pain, facial swelling, hearing loss, mouth sores, sneezing, sore throat, trouble swallowing and voice change.   Eyes: Negative for pain, discharge, redness, itching and visual disturbance.  Respiratory: Negative for cough, choking, shortness of breath and wheezing.   Cardiovascular: Negative for chest pain, palpitations and leg swelling.  Gastrointestinal: Negative for abdominal pain, blood in stool, constipation, diarrhea and vomiting.  Endocrine: Negative for cold intolerance, heat intolerance and polydipsia.  Genitourinary: Negative for decreased urine volume, dysuria and hematuria.  Musculoskeletal: Negative for arthralgias, back pain and gait problem.  Skin: Negative for rash.  Allergic/Immunologic: Negative for environmental allergies.  Neurological: Negative for seizures, syncope, light-headedness and headaches.  Hematological: Negative for adenopathy.  Psychiatric/Behavioral: Positive for dysphoric mood. Negative for agitation and suicidal ideas. The patient is not nervous/anxious.     Per HPI unless specifically indicated above     Objective:    BP 100/62 (BP Location: Left Arm, Patient Position: Sitting, Cuff Size: Large)   Pulse 87   Temp 97.9 F (36.6 C)   Ht 5\' 9"  (1.753 m)   Wt (!) 390 lb 4 oz (177 kg)   SpO2 97%   BMI 57.63 kg/m   Wt Readings from Last 3 Encounters:  04/13/17 (!) 390 lb 4 oz (  177 kg)  03/08/17 (!) 395 lb (179.2 kg)  02/14/17 (!) 393 lb (178.3 kg)    Physical Exam  Constitutional: He is oriented to person, place, and time. He appears well-developed and well-nourished.  HENT:  Head: Normocephalic and atraumatic.  Neck: Neck supple.  Cardiovascular: Normal rate and regular rhythm.   Pulmonary/Chest: Effort normal and breath sounds normal. He has no wheezes.  Abdominal: Soft. Bowel sounds are normal. There is no hepatosplenomegaly. There is no tenderness.  Musculoskeletal: He  exhibits no edema.  Lymphadenopathy:    He has no cervical adenopathy.  Neurological: He is alert and oriented to person, place, and time.  Skin: Skin is warm and dry.  Psychiatric: He has a normal mood and affect. His behavior is normal.  Vitals reviewed.   Results for orders placed or performed during the hospital encounter of 04/07/17  Lipid panel  Result Value Ref Range   Cholesterol 158 0 - 200 mg/dL   Triglycerides 161 (H) <150 mg/dL   HDL 34 (L) >09 mg/dL   Total CHOL/HDL Ratio 4.6 RATIO   VLDL 59 (H) 0 - 40 mg/dL   LDL Cholesterol 65 0 - 99 mg/dL      Assessment & Plan:    Encounter Diagnoses  Name Primary?  . Essential hypertension, benign Yes  . Hyperlipidemia, unspecified hyperlipidemia type   . Morbid obesity, unspecified obesity type (HCC)   . BMI 50.0-59.9, adult (HCC)   . Uncontrolled type 2 diabetes mellitus without complication, with long-term current use of insulin (HCC)     -pt counseled to increase walking/exercise to at least 5 days/week for 45-60 minutes -pt to Continue same meds -pt to Continue with dr Fransico Him for DM -follow up 4 months. RTO sooner prn

## 2017-05-17 ENCOUNTER — Ambulatory Visit: Payer: Self-pay | Admitting: "Endocrinology

## 2017-06-27 ENCOUNTER — Ambulatory Visit: Payer: Self-pay | Admitting: Physician Assistant

## 2017-08-12 ENCOUNTER — Other Ambulatory Visit (HOSPITAL_COMMUNITY)
Admission: RE | Admit: 2017-08-12 | Discharge: 2017-08-12 | Disposition: A | Discharge status: Home / Self Care | Payer: Self-pay | Source: Ambulatory Visit | Attending: Physician Assistant | Admitting: Physician Assistant

## 2017-08-12 DIAGNOSIS — E785 Hyperlipidemia, unspecified: Secondary | ICD-10-CM | POA: Insufficient documentation

## 2017-08-12 LAB — LIPID PANEL
Cholesterol: 219 mg/dL — ABNORMAL HIGH (ref 0–200)
HDL: 38 mg/dL — AB (ref >40)
LDL CALC: 142 mg/dL — AB (ref 0–99)
Total CHOL/HDL Ratio: 5.8 RATIO
Triglycerides: 195 mg/dL — ABNORMAL HIGH (ref <150)
VLDL: 39 mg/dL (ref 0–40)

## 2017-08-15 ENCOUNTER — Ambulatory Visit: Payer: Self-pay | Admitting: Physician Assistant

## 2017-08-16 ENCOUNTER — Ambulatory Visit: Payer: Self-pay | Admitting: Physician Assistant

## 2017-08-16 ENCOUNTER — Other Ambulatory Visit (HOSPITAL_COMMUNITY)
Admission: RE | Admit: 2017-08-16 | Discharge: 2017-08-16 | Disposition: A | Discharge status: Home / Self Care | Payer: Self-pay | Source: Ambulatory Visit | Attending: Orthopedic Surgery | Admitting: Orthopedic Surgery

## 2017-08-16 ENCOUNTER — Encounter: Payer: Self-pay | Admitting: Physician Assistant

## 2017-08-16 VITALS — BP 102/64 | HR 80 | Temp 97.9°F | Ht 69.0 in | Wt 391.0 lb

## 2017-08-16 DIAGNOSIS — E1165 Type 2 diabetes mellitus with hyperglycemia: Secondary | ICD-10-CM | POA: Insufficient documentation

## 2017-08-16 DIAGNOSIS — Z91199 Patient's noncompliance with other medical treatment and regimen due to unspecified reason: Secondary | ICD-10-CM

## 2017-08-16 DIAGNOSIS — E785 Hyperlipidemia, unspecified: Secondary | ICD-10-CM

## 2017-08-16 DIAGNOSIS — Z9119 Patient's noncompliance with other medical treatment and regimen: Secondary | ICD-10-CM

## 2017-08-16 DIAGNOSIS — Z794 Long term (current) use of insulin: Secondary | ICD-10-CM | POA: Insufficient documentation

## 2017-08-16 DIAGNOSIS — IMO0001 Reserved for inherently not codable concepts without codable children: Secondary | ICD-10-CM

## 2017-08-16 DIAGNOSIS — Z6841 Body Mass Index (BMI) 40.0 and over, adult: Secondary | ICD-10-CM

## 2017-08-16 DIAGNOSIS — I10 Essential (primary) hypertension: Secondary | ICD-10-CM

## 2017-08-16 LAB — COMPREHENSIVE METABOLIC PANEL
ALT: 38 U/L (ref 17–63)
AST: 30 U/L (ref 15–41)
Albumin: 3.7 g/dL (ref 3.5–5.0)
Alkaline Phosphatase: 46 U/L (ref 38–126)
Anion gap: 7 (ref 5–15)
BUN: 14 mg/dL (ref 6–20)
CHLORIDE: 99 mmol/L — AB (ref 101–111)
CO2: 28 mmol/L (ref 22–32)
CREATININE: 0.82 mg/dL (ref 0.61–1.24)
Calcium: 9.2 mg/dL (ref 8.9–10.3)
GFR calc Af Amer: >60 mL/min (ref >60)
GLUCOSE: 180 mg/dL — AB (ref 65–99)
POTASSIUM: 4.6 mmol/L (ref 3.5–5.1)
Sodium: 134 mmol/L — ABNORMAL LOW (ref 135–145)
Total Bilirubin: 1 mg/dL (ref 0.3–1.2)
Total Protein: 7.4 g/dL (ref 6.5–8.1)

## 2017-08-16 LAB — HEMOGLOBIN A1C
Hgb A1c MFr Bld: 8 % — ABNORMAL HIGH (ref 4.8–5.6)
MEAN PLASMA GLUCOSE: 182.9 mg/dL

## 2017-08-16 MED ORDER — ATORVASTATIN CALCIUM 20 MG PO TABS: 20.0000 mg | ORAL_TABLET | Freq: Every day | ORAL | 2 refills | Status: DC

## 2017-08-16 NOTE — Progress Notes (Signed)
BP 102/64 (BP Location: Left Arm, Patient Position: Sitting, Cuff Size: Normal)   Pulse 80   Temp 97.9 F (36.6 C)   Ht 5\' 9"  (1.753 m)   Wt (!) 391 lb (177.4 kg)   SpO2 96%   BMI 57.74 kg/m    Subjective:    Patient ID: Adam Long, male    DOB: [birth year], 41 y.o.   MRN: [id number]  HPI: Adam Long is a 41 y.o. male presenting on 08/16/2017 for Hypertension and Hyperlipidemia   HPI   Pt says he is having panic attacks.  [facility]  He was started on prozac.  It is helping.   He is way past due with seeing dr Fransico HimNida who manages his DM.   Pt says he filled out [employer] discount application at some point but doesn't know if he ever got the discount.  He says he is planning to apply for medicaid.   Pt is not using his lovastatin or his novolog.   He says he sometimes checks his bs.  He is vague about "sometimes it's high"  Relevant past medical, surgical, family and social history reviewed and updated as indicated. Interim medical history since our last visit reviewed. Allergies and medications reviewed and updated.   Current Outpatient Medications:  .  FLUoxetine (PROZAC) 10 MG capsule, Take 30 mg daily by mouth. Take 3 10mg  cap po qd in the morning, Disp: , Rfl:  .  insulin glargine (LANTUS) 100 UNIT/ML injection, Inject 100 Units at bedtime into the skin. , Disp: , Rfl:  .  JANUMET 50-500 MG tablet, TAKE 1 Tablet  BY MOUTH TWICE DAILY, Disp: 180 tablet, Rfl: 3 .  lisinopril (PRINIVIL,ZESTRIL) 20 MG tablet, TAKE ONE TABLET BY MOUTH ONCE DAILY FOR BLOOD PRESSURE, Disp: 90 tablet, Rfl: 3 .  loperamide (IMODIUM) 2 MG capsule, Take 4 mg by mouth as needed for diarrhea or loose stools., Disp: , Rfl:  .  metoprolol tartrate (LOPRESSOR) 50 MG tablet, TAKE 1 TABLET BY MOUTH TWICE DAILY FOR BLOOD PRESSURE, Disp: 180 tablet, Rfl: 3 .  Omega-3 Fatty Acids (FISH OIL) 1000 MG CAPS, Take 2 capsules by mouth 2 (two) times daily. , Disp: , Rfl:  .  insulin aspart (NOVOLOG)  100 UNIT/ML injection, Inject 15-21 Units into the skin 3 (three) times daily before meals., Disp: , Rfl:  .  lovastatin (MEVACOR) 20 MG tablet, TAKE ONE TABLET BY MOUTH AT BEDTIME FOR CHOLESTEROL (Patient not taking: Reported on 08/16/2017), Disp: 30 tablet, Rfl: 4   Review of Systems  Constitutional: Negative for appetite change, chills, diaphoresis, fatigue, fever and unexpected weight change.  HENT: Positive for dental problem. Negative for congestion, drooling, ear pain, facial swelling, hearing loss, mouth sores, sneezing, sore throat, trouble swallowing and voice change.   Eyes: Negative for pain, discharge, redness, itching and visual disturbance.  Respiratory: Negative for cough, choking, shortness of breath and wheezing.   Cardiovascular: Negative for chest pain, palpitations and leg swelling.  Gastrointestinal: Negative for abdominal pain, blood in stool, constipation, diarrhea and vomiting.  Endocrine: Negative for cold intolerance, heat intolerance and polydipsia.  Genitourinary: Negative for decreased urine volume, dysuria and hematuria.  Musculoskeletal: Negative for arthralgias, back pain and gait problem.  Skin: Negative for rash.  Allergic/Immunologic: Negative for environmental allergies.  Neurological: Negative for seizures, syncope, light-headedness and headaches.  Hematological: Negative for adenopathy.  Psychiatric/Behavioral: Positive for agitation and dysphoric mood. Negative for suicidal ideas. The patient is nervous/anxious.  Per HPI unless specifically indicated above     Objective:    BP 102/64 (BP Location: Left Arm, Patient Position: Sitting, Cuff Size: Normal)   Pulse 80   Temp 97.9 F (36.6 C)   Ht 5\' 9"  (1.753 m)   Wt (!) 391 lb (177.4 kg)   SpO2 96%   BMI 57.74 kg/m   Wt Readings from Last 3 Encounters:  08/16/17 (!) 391 lb (177.4 kg)  04/13/17 (!) 390 lb 4 oz (177 kg)  03/08/17 (!) 395 lb (179.2 kg)    Physical Exam  Constitutional: He  is oriented to person, place, and time. He appears well-developed and well-nourished.  HENT:  Head: Normocephalic and atraumatic.  Neck: Neck supple.  Cardiovascular: Normal rate and regular rhythm.  Pulmonary/Chest: Effort normal and breath sounds normal. He has no wheezes.  Abdominal: Soft. Bowel sounds are normal. There is no hepatosplenomegaly. There is no tenderness.  Musculoskeletal: He exhibits no edema.  Lymphadenopathy:    He has no cervical adenopathy.  Neurological: He is alert and oriented to person, place, and time.  Skin: Skin is warm and dry.  Psychiatric: He has a normal mood and affect. His behavior is normal.  Vitals reviewed.   Results for orders placed or performed during the hospital encounter of 08/12/17  Lipid panel  Result Value Ref Range   Cholesterol 219 (H) 0 - 200 mg/dL   Triglycerides 536195 (H) <150 mg/dL   HDL 38 (L) >64>40 mg/dL   Total CHOL/HDL Ratio 5.8 RATIO   VLDL 39 0 - 40 mg/dL   LDL Cholesterol 403142 (H) 0 - 99 mg/dL      Assessment & Plan:   Encounter Diagnoses  Name Primary?  . Hyperlipidemia, unspecified hyperlipidemia type Yes  . Uncontrolled type 2 diabetes mellitus without complication, with long-term current use of insulin (HCC)   . Essential hypertension, benign   . Personal history of noncompliance with medical treatment, presenting hazards to health   . Morbid obesity, unspecified obesity type (HCC)   . BMI 50.0-59.9, adult [facility](HCC)     -reviewed labs with pt -pt will return to lab to Check cmp, a1c today when leaves office.  We will call with results -pt referred for DM eye exam -Pt is on [facility]. will  rx atorvastatin for the lipids and Stop the lovastatin.  -[facility].  -pt to check on his [facility] discount and reschedule with dr Fransico HimNida -pt to follow up here 3 months.  RTO sooner prn

## 2017-11-16 ENCOUNTER — Ambulatory Visit: Payer: Self-pay | Admitting: Physician Assistant

## 2017-11-17 ENCOUNTER — Ambulatory Visit: Payer: Self-pay | Admitting: Physician Assistant

## 2017-11-22 ENCOUNTER — Ambulatory Visit: Payer: Self-pay | Admitting: Physician Assistant

## 2017-11-22 ENCOUNTER — Other Ambulatory Visit (HOSPITAL_COMMUNITY)
Admission: RE | Admit: 2017-11-22 | Discharge: 2017-11-22 | Disposition: A | Discharge status: Home / Self Care | Payer: Self-pay | Source: Ambulatory Visit | Attending: Physician Assistant | Admitting: Physician Assistant

## 2017-11-22 ENCOUNTER — Encounter: Payer: Self-pay | Admitting: Physician Assistant

## 2017-11-22 VITALS — BP 122/81 | HR 83 | Temp 98.1°F | Ht 69.0 in | Wt 393.8 lb

## 2017-11-22 DIAGNOSIS — Z9119 Patient's noncompliance with other medical treatment and regimen: Secondary | ICD-10-CM

## 2017-11-22 DIAGNOSIS — IMO0001 Reserved for inherently not codable concepts without codable children: Secondary | ICD-10-CM

## 2017-11-22 DIAGNOSIS — E785 Hyperlipidemia, unspecified: Secondary | ICD-10-CM

## 2017-11-22 DIAGNOSIS — E1165 Type 2 diabetes mellitus with hyperglycemia: Principal | ICD-10-CM

## 2017-11-22 DIAGNOSIS — Z91199 Patient's noncompliance with other medical treatment and regimen due to unspecified reason: Secondary | ICD-10-CM

## 2017-11-22 DIAGNOSIS — Z794 Long term (current) use of insulin: Secondary | ICD-10-CM | POA: Insufficient documentation

## 2017-11-22 DIAGNOSIS — I10 Essential (primary) hypertension: Secondary | ICD-10-CM

## 2017-11-22 LAB — COMPREHENSIVE METABOLIC PANEL
ALBUMIN: 3.6 g/dL (ref 3.5–5.0)
ALT: 34 U/L (ref 17–63)
ANION GAP: 11 (ref 5–15)
AST: 25 U/L (ref 15–41)
Alkaline Phosphatase: 44 U/L (ref 38–126)
BILIRUBIN TOTAL: 0.9 mg/dL (ref 0.3–1.2)
BUN: 13 mg/dL (ref 6–20)
CO2: 25 mmol/L (ref 22–32)
Calcium: 9.2 mg/dL (ref 8.9–10.3)
Chloride: 94 mmol/L — ABNORMAL LOW (ref 101–111)
Creatinine, Ser: 0.76 mg/dL (ref 0.61–1.24)
GFR calc Af Amer: >60 mL/min (ref >60)
Glucose, Bld: 234 mg/dL — ABNORMAL HIGH (ref 65–99)
POTASSIUM: 4.3 mmol/L (ref 3.5–5.1)
Sodium: 130 mmol/L — ABNORMAL LOW (ref 135–145)
TOTAL PROTEIN: 7.2 g/dL (ref 6.5–8.1)

## 2017-11-22 LAB — LIPID PANEL
CHOL/HDL RATIO: 4.7 ratio
Cholesterol: 141 mg/dL (ref 0–200)
HDL: 30 mg/dL — ABNORMAL LOW (ref >40)
LDL Cholesterol: 69 mg/dL (ref 0–99)
TRIGLYCERIDES: 210 mg/dL — AB (ref <150)
VLDL: 42 mg/dL — AB (ref 0–40)

## 2017-11-22 LAB — HEMOGLOBIN A1C
Hgb A1c MFr Bld: 9.2 % — ABNORMAL HIGH (ref 4.8–5.6)
Mean Plasma Glucose: 217.34 mg/dL

## 2017-11-22 NOTE — Progress Notes (Signed)
BP 122/81 (BP Location: Right Arm, Patient Position: Sitting, Cuff Size: Large)   Pulse 83   Temp 98.1 F (36.7 C)   Ht 5\' 9"  (1.753 m)   Wt (!) 393 lb 12 oz (178.6 kg)   SpO2 99%   BMI 58.15 kg/m    Subjective:    Patient ID: Adam Long, male    DOB: [birth year], 42 y.o.   MRN: [id number]  HPI: Adam Long is a 42 y.o. male presenting on 11/22/2017 for No chief complaint on file.   HPI   Pt has still not done anything with checking on his [employer] care.  He has not rescheduled with dr Eligah East.  He was encouraged to do these things at previous OV over 3 months ago  He is still going to [facility] for his panic attacks    Relevant past medical, surgical, family and social history reviewed and updated as indicated. Interim medical history since our last visit reviewed. Allergies and medications reviewed and updated.   Current Outpatient Medications:  .  atorvastatin (LIPITOR) 20 MG tablet, Take 1 tablet (20 mg total) daily by mouth., Disp: 90 tablet, Rfl: 2 .  insulin aspart (NOVOLOG) 100 UNIT/ML injection, Inject 15-21 Units into the skin 3 (three) times daily before meals., Disp: , Rfl:  .  insulin glargine (LANTUS) 100 UNIT/ML injection, Inject 100 Units at bedtime into the skin. , Disp: , Rfl:  .  JANUMET 50-500 MG tablet, TAKE 1 Tablet  BY MOUTH TWICE DAILY, Disp: 180 tablet, Rfl: 3 .  lisinopril (PRINIVIL,ZESTRIL) 20 MG tablet, TAKE ONE TABLET BY MOUTH ONCE DAILY FOR BLOOD PRESSURE, Disp: 90 tablet, Rfl: 3 .  loperamide (IMODIUM) 2 MG capsule, Take 4 mg by mouth as needed for diarrhea or loose stools., Disp: , Rfl:  .  metoprolol tartrate (LOPRESSOR) 50 MG tablet, TAKE 1 TABLET BY MOUTH TWICE DAILY FOR BLOOD PRESSURE, Disp: 180 tablet, Rfl: 3 .  Omega-3 Fatty Acids (FISH OIL) 1000 MG CAPS, Take 1 capsule by mouth 2 (two) times daily. , Disp: , Rfl:  .  FLUoxetine (PROZAC) 10 MG capsule, Take 30 mg daily by mouth. Take 3 10mg  cap po qd in the morning, Disp:  , Rfl:    Review of Systems  Constitutional: Negative for appetite change, chills, diaphoresis, fatigue, fever and unexpected weight change.  HENT: Positive for congestion and dental problem. Negative for drooling, ear pain, facial swelling, hearing loss, mouth sores, sneezing, sore throat, trouble swallowing and voice change.   Eyes: Negative for pain, discharge, redness, itching and visual disturbance.  Respiratory: Negative for cough, choking, shortness of breath and wheezing.   Cardiovascular: Negative for chest pain, palpitations and leg swelling.  Gastrointestinal: Negative for abdominal pain, blood in stool, constipation, diarrhea and vomiting.  Endocrine: Negative for cold intolerance, heat intolerance and polydipsia.  Genitourinary: Negative for decreased urine volume, dysuria and hematuria.  Musculoskeletal: Negative for arthralgias, back pain and gait problem.  Skin: Negative for rash.  Allergic/Immunologic: Negative for environmental allergies.  Neurological: Negative for seizures, syncope, light-headedness and headaches.  Hematological: Negative for adenopathy.  Psychiatric/Behavioral: Positive for dysphoric mood. Negative for agitation and suicidal ideas. The patient is nervous/anxious.     Per HPI unless specifically indicated above     Objective:    BP 122/81 (BP Location: Right Arm, Patient Position: Sitting, Cuff Size: Large)   Pulse 83   Temp 98.1 F (36.7 C)   Ht 5\' 9"  (1.753 m)  Wt (!) 393 lb 12 oz (178.6 kg)   SpO2 99%   BMI 58.15 kg/m   Wt Readings from Last 3 Encounters:  11/22/17 (!) 393 lb 12 oz (178.6 kg)  08/16/17 (!) 391 lb (177.4 kg)  04/13/17 (!) 390 lb 4 oz (177 kg)    Physical Exam  Constitutional: He is oriented to person, place, and time. He appears well-developed and well-nourished.  HENT:  Head: Normocephalic and atraumatic.  Neck: Neck supple.  Cardiovascular: Normal rate and regular rhythm.  Pulmonary/Chest: Effort normal and breath  sounds normal. He has no wheezes.  Abdominal: Soft. Bowel sounds are normal. There is no hepatosplenomegaly. There is no tenderness.  Musculoskeletal: He exhibits no edema.  Lymphadenopathy:    He has no cervical adenopathy.  Neurological: He is alert and oriented to person, place, and time.  Skin: Skin is warm and dry.  Psychiatric: He has a normal mood and affect. His behavior is normal.  Vitals reviewed.       Assessment & Plan:   Encounter Diagnoses  Name Primary?  Marland Kitchen. Uncontrolled type 2 diabetes mellitus without complication, with long-term current use of insulin (HCC) Yes  . Essential hypertension, benign   . Hyperlipidemia, unspecified hyperlipidemia type   . Personal history of noncompliance with medical treatment, presenting hazards to health   . Morbid obesity, unspecified obesity type (HCC)     -refer pt for annual DM eye exam -pt reminded [facility].  He is given another copy and the phone number for the financial counselor -pt to get fasting Labs drawn today when leaves office.  We will call him with results -pt to contact Dr Fransico HimNida and schedule follow up for his diabetes -[facility] -pt to follow up in 3 months.  RTO sooner prn

## 2017-11-22 NOTE — Patient Instructions (Signed)
Financial Counselor- [phone]  

## 2017-11-23 LAB — MICROALBUMIN, URINE: MICROALB UR: 5.1 ug/mL — AB

## 2018-02-21 ENCOUNTER — Other Ambulatory Visit (HOSPITAL_COMMUNITY)
Admission: RE | Admit: 2018-02-21 | Discharge: 2018-02-21 | Disposition: A | Discharge status: Home / Self Care | Payer: Self-pay | Source: Ambulatory Visit | Attending: Physician Assistant | Admitting: Physician Assistant

## 2018-02-21 ENCOUNTER — Ambulatory Visit: Payer: Self-pay | Admitting: Physician Assistant

## 2018-02-21 ENCOUNTER — Encounter: Payer: Self-pay | Admitting: Physician Assistant

## 2018-02-21 VITALS — BP 145/88 | HR 84 | Ht 69.0 in | Wt 399.5 lb

## 2018-02-21 DIAGNOSIS — IMO0001 Reserved for inherently not codable concepts without codable children: Secondary | ICD-10-CM

## 2018-02-21 DIAGNOSIS — Z794 Long term (current) use of insulin: Principal | ICD-10-CM

## 2018-02-21 DIAGNOSIS — Z6841 Body Mass Index (BMI) 40.0 and over, adult: Secondary | ICD-10-CM

## 2018-02-21 DIAGNOSIS — I10 Essential (primary) hypertension: Secondary | ICD-10-CM | POA: Insufficient documentation

## 2018-02-21 DIAGNOSIS — E785 Hyperlipidemia, unspecified: Secondary | ICD-10-CM

## 2018-02-21 DIAGNOSIS — E1165 Type 2 diabetes mellitus with hyperglycemia: Principal | ICD-10-CM

## 2018-02-21 DIAGNOSIS — R197 Diarrhea, unspecified: Secondary | ICD-10-CM

## 2018-02-21 LAB — LIPID PANEL
Cholesterol: 155 mg/dL (ref 0–200)
HDL: 39 mg/dL — ABNORMAL LOW (ref >40)
LDL CALC: 78 mg/dL (ref 0–99)
Total CHOL/HDL Ratio: 4 RATIO
Triglycerides: 188 mg/dL — ABNORMAL HIGH (ref <150)
VLDL: 38 mg/dL (ref 0–40)

## 2018-02-21 LAB — HEMOGLOBIN A1C
Hgb A1c MFr Bld: 8.5 % — ABNORMAL HIGH (ref 4.8–5.6)
Mean Plasma Glucose: 197.25 mg/dL

## 2018-02-21 LAB — COMPREHENSIVE METABOLIC PANEL
ALK PHOS: 44 U/L (ref 38–126)
ALT: 37 U/L (ref 17–63)
AST: 34 U/L (ref 15–41)
Albumin: 3.5 g/dL (ref 3.5–5.0)
Anion gap: 9 (ref 5–15)
BILIRUBIN TOTAL: 1 mg/dL (ref 0.3–1.2)
BUN: 13 mg/dL (ref 6–20)
CALCIUM: 9 mg/dL (ref 8.9–10.3)
CO2: 27 mmol/L (ref 22–32)
CREATININE: 0.77 mg/dL (ref 0.61–1.24)
Chloride: 98 mmol/L — ABNORMAL LOW (ref 101–111)
GFR calc Af Amer: >60 mL/min (ref >60)
Glucose, Bld: 171 mg/dL — ABNORMAL HIGH (ref 65–99)
Potassium: 4.7 mmol/L (ref 3.5–5.1)
SODIUM: 134 mmol/L — AB (ref 135–145)
Total Protein: 7.2 g/dL (ref 6.5–8.1)

## 2018-02-21 MED ORDER — SITAGLIPTIN PHOSPHATE 100 MG PO TABS: 100.0000 mg | ORAL_TABLET | Freq: Every day | ORAL | 1 refills | Status: DC

## 2018-02-21 MED ORDER — METFORMIN HCL 500 MG PO TABS: 500.0000 mg | ORAL_TABLET | Freq: Two times a day (BID) | ORAL | 3 refills | Status: DC

## 2018-02-21 NOTE — Progress Notes (Signed)
BP (!) 145/88 (BP Location: Right Wrist, Patient Position: Sitting, Cuff Size: Normal)   Pulse 84   Ht  (1.753 m)   Wt (!) 399 lb 8 oz (181.2 kg)   SpO2 97%   BMI 59.00 kg/m    Subjective:    Patient ID: Adam Long, male    DOB: [birth year], 42 y.o.   MRN: [id number]  HPI: DIALLO PONDER is a 42 y.o. male presenting on 02/21/2018 for Hyperlipidemia   HPI Pt has appt June 20 with dr Fransico Him for uncontrolled diabetes.  Pt complains of diarrhea for a month.  Pt states 3 or 4 mornings each week.  He says it is bad enough that it is interfering with his daily life- he is unable to do and go like he would like because he has to be near and bathroom.    Pt out of his novolog for several weeks.  Pt is still going to [facility] for MH issues.  They stopped his prozac.   Pt got approved for his [facility] discount but not 100%-   He has his letter at home.  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 Interim medical history since our last visit reviewed. Allergies and medications reviewed and updated.   Current Outpatient Medications:  .  atorvastatin (LIPITOR) 20 MG tablet, Take 1 tablet (20 mg total) daily by mouth., Disp: 90 tablet, Rfl: 2 .  insulin glargine (LANTUS) 100 UNIT/ML injection, Inject 100 Units at bedtime into the skin. , Disp: , Rfl:  .  JANUMET 50-500 MG tablet, TAKE 1 Tablet  BY MOUTH TWICE DAILY, Disp: 180 tablet, Rfl: 3 .  lisinopril (PRINIVIL,ZESTRIL) 20 MG tablet, TAKE ONE TABLET BY MOUTH ONCE DAILY FOR BLOOD PRESSURE, Disp: 90 tablet, Rfl: 3 .  loperamide (IMODIUM) 2 MG capsule, Take 4 mg by mouth as needed for diarrhea or loose stools., Disp: , Rfl:  .  metoprolol tartrate (LOPRESSOR) 50 MG tablet, TAKE 1 TABLET BY MOUTH TWICE DAILY FOR BLOOD PRESSURE, Disp: 180 tablet, Rfl: 3 .  Omega-3 Fatty Acids (FISH OIL) 1000 MG CAPS, Take 1 capsule  by mouth 2 (two) times daily. , Disp: , Rfl:  .  insulin aspart (NOVOLOG) 100 UNIT/ML injection, Inject 15-21 Units into the skin 3 (three) times daily before meals., Disp: , Rfl:   Review of Systems  Constitutional: Negative for appetite change, chills, diaphoresis, fatigue, fever and unexpected weight change.  HENT: Positive for dental problem. Negative for congestion, drooling, ear pain, facial swelling, hearing loss, mouth sores, sneezing, sore throat, trouble swallowing and voice change.   Eyes: Negative for pain, discharge, redness, itching and visual disturbance.  Respiratory: Negative for cough, choking, shortness of breath and wheezing.   Cardiovascular: Negative for chest pain, palpitations and leg swelling.  Gastrointestinal: Negative for abdominal pain, blood in stool, constipation, diarrhea and vomiting.  Endocrine: Negative for cold intolerance, heat intolerance and polydipsia.  Genitourinary: Negative for decreased urine volume, dysuria and hematuria.  Musculoskeletal: Negative for arthralgias, back pain and gait problem.  Skin: Negative for rash.  Allergic/Immunologic: Negative for environmental allergies.  Neurological: Negative for seizures, syncope, light-headedness and headaches.  Hematological: Negative for adenopathy.  Psychiatric/Behavioral: Negative for agitation, dysphoric mood and suicidal ideas. The patient is not nervous/anxious.     Per HPI unless specifically indicated above     Objective:  BP (!) 145/88 (BP Location: Right Wrist, Patient Position: Sitting, Cuff Size: Normal)   Pulse 84   Ht  (1.753 m)   Wt (!) 399 lb 8 oz (181.2 kg)   SpO2 97%   BMI 59.00 kg/m   Wt Readings from Last 3 Encounters:  02/21/18 (!) 399 lb 8 oz (181.2 kg)  11/22/17 (!) 393 lb 12 oz (178.6 kg)  08/16/17 (!) 391 lb (177.4 kg)    Physical Exam  Constitutional: He is oriented to person, place, and time. He appears well-developed and well-nourished.  HENT:  Head:  Normocephalic and atraumatic.  Neck: Neck supple.  Cardiovascular: Normal rate and regular rhythm.  Pulmonary/Chest: Effort normal and breath sounds normal. He has no wheezes.  Abdominal: Soft. Bowel sounds are normal. There is no hepatosplenomegaly. There is no tenderness.  Musculoskeletal: He exhibits no edema.  Lymphadenopathy:    He has no cervical adenopathy.  Neurological: He is alert and oriented to person, place, and time.  Skin: Skin is warm and dry.  Psychiatric: He has a normal mood and affect. His behavior is normal.  Vitals reviewed.       Assessment & Plan:     Encounter Diagnoses  Name Primary?  Marland Kitchen Uncontrolled type 2 diabetes mellitus without complication, with long-term current use of insulin (HCC) Yes  . Essential hypertension, benign   . Hyperlipidemia, unspecified hyperlipidemia type   . Morbid obesity, unspecified obesity type (HCC)   . BMI 50.0-59.9, adult (HCC)   . Diarrhea, unspecified type     -pt to Get labs drawn -pt Counseled pt to avoid running out of his medications/insulin.  He is to resume the novolog and continue the lantus. -he is to monitor his fbs and take log sheet with him to his appointment with dr Fransico Him next month -will discontinue janumet and rx januvia  in effort to improve the diarrhea  -Pt has diabetic eye appointment upcoming in June   -Pt to follow up in 3 months.  He is to RTO sooner if his diarrhea worsens or fails to improve with medication changes

## 2018-02-21 NOTE — Patient Instructions (Signed)
[facility] [address] [place], [place] [address] [phone] Tuesday, February 28, 2018 8:40am

## 2018-02-22 ENCOUNTER — Telehealth: Payer: Self-pay | Admitting: Student

## 2018-02-22 NOTE — Telephone Encounter (Signed)
Called and notified pt of the previous. Pt verbalized understanding.

## 2018-02-22 NOTE — Telephone Encounter (Signed)
LPN called and left voicemail for patient to call back.  LPN attempting to notify patient that he is to also d/c metformin due to already being on 500 mg when taking Janumet and experiencing diarrhea. [facility] was notified not to send metformin, so patient should not expect it to arrive in the mail. Pt is to expect increased dose of Januvia only.

## 2018-03-16 ENCOUNTER — Encounter: Payer: Self-pay | Admitting: "Endocrinology

## 2018-03-16 ENCOUNTER — Ambulatory Visit (INDEPENDENT_AMBULATORY_CARE_PROVIDER_SITE_OTHER): Payer: Self-pay | Admitting: "Endocrinology

## 2018-03-16 VITALS — BP 122/80 | HR 82 | Ht 69.0 in | Wt >= 6400 oz

## 2018-03-16 DIAGNOSIS — Z9119 Patient's noncompliance with other medical treatment and regimen: Secondary | ICD-10-CM

## 2018-03-16 DIAGNOSIS — E1165 Type 2 diabetes mellitus with hyperglycemia: Secondary | ICD-10-CM

## 2018-03-16 DIAGNOSIS — IMO0002 Reserved for concepts with insufficient information to code with codable children: Secondary | ICD-10-CM

## 2018-03-16 DIAGNOSIS — E782 Mixed hyperlipidemia: Secondary | ICD-10-CM

## 2018-03-16 DIAGNOSIS — Z91199 Patient's noncompliance with other medical treatment and regimen due to unspecified reason: Secondary | ICD-10-CM | POA: Insufficient documentation

## 2018-03-16 DIAGNOSIS — E118 Type 2 diabetes mellitus with unspecified complications: Secondary | ICD-10-CM

## 2018-03-16 DIAGNOSIS — I10 Essential (primary) hypertension: Secondary | ICD-10-CM

## 2018-03-16 NOTE — Progress Notes (Signed)
Subjective:    Patient ID: Adam FlankKenneth R Michelle, male    DOB: 1976/07/01. Patient is being seen in follow-up for management of diabetes requested by  Jacquelin HawkingMcElroy, Shannon, PA-C  Past Medical History:  Diagnosis Date  . Anxiety   . Cholelithiasis   . Depression   . Diabetes mellitus   . Hypercholesteremia   . Hypertension   . Morbid obesity (HCC)    History reviewed. No pertinent surgical history. Social History   Socioeconomic History  . Marital status: Single    Spouse name: Not on file  . Number of children: Not on file  . Years of education: Not on file  . Highest education level: Not on file  Occupational History  . Not on file  Social Needs  . Financial resource strain: Not on file  . Food insecurity:    Worry: Not on file    Inability: Not on file  . Transportation needs:    Medical: Not on file    Non-medical: Not on file  Tobacco Use  . Smoking status: Never Smoker  . Smokeless tobacco: Former NeurosurgeonUser    Types: Chew  Substance and Sexual Activity  . Alcohol use: Yes    Comment: occasionally  . Drug use: No  . Sexual activity: Not on file  Lifestyle  . Physical activity:    Days per week: Not on file    Minutes per session: Not on file  . Stress: Not on file  Relationships  . Social connections:    Talks on phone: Not on file    Gets together: Not on file    Attends religious service: Not on file    Active member of club or organization: Not on file    Attends meetings of clubs or organizations: Not on file    Relationship status: Not on file  Other Topics Concern  . Not on file  Social History Narrative  . Not on file   Outpatient Encounter Medications as of 03/16/2018  Medication Sig  . sitaGLIPtin (JANUVIA) 50 MG tablet Take 50 mg by mouth daily.  . [DISCONTINUED] sitaGLIPtin-metformin (JANUMET) 50-500 MG tablet Take 1 tablet by mouth 2 (two) times daily with a meal.  . atorvastatin (LIPITOR) 20 MG tablet Take 1 tablet (20 mg total) daily by mouth.   . insulin aspart (NOVOLOG) 100 UNIT/ML injection Inject 10-16 Units into the skin 3 (three) times daily before meals.  . insulin glargine (LANTUS) 100 UNIT/ML injection Inject 80 Units into the skin at bedtime.  Marland Kitchen. lisinopril (PRINIVIL,ZESTRIL) 20 MG tablet TAKE ONE TABLET BY MOUTH ONCE DAILY FOR BLOOD PRESSURE  . loperamide (IMODIUM) 2 MG capsule Take 4 mg by mouth as needed for diarrhea or loose stools.  . metoprolol tartrate (LOPRESSOR) 50 MG tablet TAKE 1 TABLET BY MOUTH TWICE DAILY FOR BLOOD PRESSURE  . Omega-3 Fatty Acids (FISH OIL) 1000 MG CAPS Take 1 capsule by mouth 2 (two) times daily.   . [DISCONTINUED] sitaGLIPtin (JANUVIA) 100 MG tablet Take 1 tablet (100 mg total) by mouth daily. (Patient not taking: Reported on 03/16/2018)   No facility-administered encounter medications on file as of 03/16/2018.    ALLERGIES: Allergies  Allergen Reactions  . Pollen Extract Other (See Comments)    Runny nose, watery eyes, sneezing   VACCINATION STATUS: Immunization History  Administered Date(s) Administered  . Influenza Split 07/01/2015, 06/30/2016    Diabetes  He presents for his follow-up diabetic visit. He has type 2 diabetes mellitus. Onset time:  He was diagnosed at approximate age of 35 years. His disease course has been fluctuating. There are no hypoglycemic associated symptoms. Pertinent negatives for hypoglycemia include no confusion, headaches, pallor or seizures. Associated symptoms include blurred vision, polydipsia and polyuria. Pertinent negatives for diabetes include no chest pain, no fatigue, no polyphagia and no weakness. There are no hypoglycemic complications. Symptoms are worsening. There are no diabetic complications. Risk factors for coronary artery disease include diabetes mellitus, dyslipidemia, family history, obesity, male sex, hypertension and sedentary lifestyle. Current diabetic treatment includes intensive insulin program and oral agent (dual therapy). His weight is  increasing steadily. He is following a generally unhealthy diet. When asked about meal planning, he reported none. He has not had a previous visit with a dietitian. He never participates in exercise. (He missed his appointment for him almost a year.  He shows up with no meter no logs.  He is A1c is 8.5%, slightly better than last time at 9.2%.) An ACE inhibitor/angiotensin II receptor blocker is being taken. Eye exam is current.  Hyperlipidemia  This is a chronic problem. The current episode started more than 1 year ago. The problem is uncontrolled. Recent lipid tests were reviewed and are high. Exacerbating diseases include diabetes and obesity. Pertinent negatives include no chest pain, myalgias or shortness of breath. Current antihyperlipidemic treatment includes statins. Risk factors for coronary artery disease include diabetes mellitus, dyslipidemia, hypertension, male sex, obesity, a sedentary lifestyle and family history.  Hypertension  This is a chronic problem. The current episode started more than 1 year ago. The problem is controlled. Associated symptoms include blurred vision. Pertinent negatives include no chest pain, headaches, neck pain, palpitations or shortness of breath. Risk factors for coronary artery disease include diabetes mellitus, dyslipidemia, male gender, obesity and sedentary lifestyle. Past treatments include angiotensin blockers.     Review of Systems  Constitutional: Negative for chills, fatigue, fever and unexpected weight change.  HENT: Negative for dental problem, mouth sores and trouble swallowing.   Eyes: Positive for blurred vision. Negative for visual disturbance.  Respiratory: Negative for cough, choking, chest tightness, shortness of breath and wheezing.   Cardiovascular: Negative for chest pain, palpitations and leg swelling.  Gastrointestinal: Negative for abdominal distention, abdominal pain, constipation, diarrhea, nausea and vomiting.  Endocrine: Positive  for polydipsia and polyuria. Negative for polyphagia.  Genitourinary: Negative for dysuria, Long pain, hematuria and urgency.  Musculoskeletal: Negative for back pain, gait problem, myalgias and neck pain.  Skin: Negative for pallor, rash and wound.  Neurological: Negative for seizures, syncope, weakness, numbness and headaches.  Psychiatric/Behavioral: Negative.  Negative for confusion and dysphoric mood.    Objective:    BP 122/80   Pulse 82   Ht 5\' 9"  (1.753 m)   Wt (!) 400 lb (181.4 kg)   BMI 59.07 kg/m   Wt Readings from Last 3 Encounters:  03/16/18 (!) 400 lb (181.4 kg)  02/21/18 (!) 399 lb 8 oz (181.2 kg)  11/22/17 (!) 393 lb 12 oz (178.6 kg)    Physical Exam  Constitutional: He is oriented to person, place, and time. He is cooperative. No distress.  Morbidly obese, in no acute distress  HENT:  Head: Normocephalic and atraumatic.  Eyes: EOM are normal.  Neck: Normal range of motion. Neck supple. No tracheal deviation present. No thyromegaly present.  Cardiovascular: Normal rate, S1 normal and S2 normal. Exam reveals no gallop.  No murmur heard. Pulses:      Dorsalis pedis pulses are 1+ on the right  side, and 1+ on the left side.       Posterior tibial pulses are 1+ on the right side, and 1+ on the left side.  Pulmonary/Chest: Effort normal. No respiratory distress. He has no wheezes.  Abdominal: Soft. He exhibits no distension. There is no tenderness. There is no guarding and no CVA tenderness.  Musculoskeletal: Normal range of motion. He exhibits no edema.       Right shoulder: He exhibits no swelling and no deformity.  Neurological: He is alert and oriented to person, place, and time. He has normal strength. No cranial nerve deficit or sensory deficit. Gait normal.  Skin: Skin is warm and dry. No rash noted. No cyanosis. Nails show no clubbing.  Psychiatric: He has a normal mood and affect. His speech is normal. Cognition and memory are normal.   CMP ( most  recent) CMP     Component Value Date/Time   NA 134 (L) 02/21/2018 1045   K 4.7 02/21/2018 1045   CL 98 (L) 02/21/2018 1045   CO2 27 02/21/2018 1045   GLUCOSE 171 (H) 02/21/2018 1045   BUN 13 02/21/2018 1045   CREATININE 0.77 02/21/2018 1045   CREATININE 0.77 01/07/2017 1040   CALCIUM 9.0 02/21/2018 1045   PROT 7.2 02/21/2018 1045   ALBUMIN 3.5 02/21/2018 1045   AST 34 02/21/2018 1045   ALT 37 02/21/2018 1045   ALKPHOS 44 02/21/2018 1045   BILITOT 1.0 02/21/2018 1045   GFRNONAA >60 02/21/2018 1045   GFRNONAA >89 04/08/2016 0903   GFRAA >60 02/21/2018 1045   GFRAA >89 04/08/2016 0903     Diabetic Labs (most recent): Lab Results  Component Value Date   HGBA1C 8.5 (H) 02/21/2018   HGBA1C 9.2 (H) 11/22/2017   HGBA1C 8.0 (H) 08/16/2017     Lipid Panel ( most recent) Lipid Panel     Component Value Date/Time   CHOL 155 02/21/2018 1045   TRIG 188 (H) 02/21/2018 1045   HDL 39 (L) 02/21/2018 1045   CHOLHDL 4.0 02/21/2018 1045   VLDL 38 02/21/2018 1045   LDLCALC 78 02/21/2018 1045       Assessment & Plan:   1. Uncontrolled type 2 diabetes mellitus with complication, unspecified whether long term insulin use (HCC)  - Patient has currently uncontrolled symptomatic type 2 DM since  41 years of age. -Missed his appointments since May 2018.  He returns with A1c of 8.5% largely unchanged from 8.9%. Recent labs reviewed.   His diabetes is complicated by obesity/sedentary life, lack of proper insurance for medications and doctors visits and patient remains at a high risk for more acute and chronic complications of diabetes which include CAD, CVA, CKD, retinopathy, and neuropathy. These are all discussed in detail with the patient.  - I have counseled the patient on diet management and weight loss, by adopting a carbohydrate restricted/protein rich diet.  -  Suggestion is made for him to avoid simple carbohydrates  from his diet including Cakes, Sweet Desserts / Pastries, Ice  Cream, Soda (diet and regular), Sweet Tea, Candies, Chips, Cookies, Store Bought Juices, Alcohol in Excess of  1-2 drinks a day, Artificial Sweeteners, and "Sugar-free" Products. This will help patient to have stable blood glucose profile and potentially avoid unintended weight gain.   - I encouraged the patient to switch to  unprocessed or minimally processed complex starch and increased protein intake (animal or plant source), fruits, and vegetables.  - Patient is advised to stick to a routine mealtimes  to eat 3 meals  a day and avoid unnecessary snacks ( to snack only to correct hypoglycemia).  - The patient has been  scheduled with Norm Salt, RDN, CDE for individualized DM education.  - I have approached patient with the following individualized plan to manage diabetes and patient agrees:   -He is on a particularly high-dose insulin, too high risk to take without commitment for monitoring blood glucose regularly.   -I approached him for lowering his Lantus to 80 units nightly, lower his NovoLog to 10 units 3 times a day before meals for pre-meal BG readings of 90-150mg /dl, plus patient specific correction dose for unexpected hyperglycemia above 150mg /dl, associated with strict monitoring of glucose 4 times a day-before meals and at bedtime.  -He is warned not to take insulin without proper monitoring of blood glucose per orders.    -Adjustment parameters are given for hypo and hyperglycemia in writing. -Patient is encouraged to call clinic for blood glucose levels less than 70 or above 300 mg /dl. -He did not tolerate Janumet due to metformin intolerance.  I advised him to discontinue Janumet.  He will continue Januvia 50 mg p.o. daily with breakfast.    - Patient specific target  A1c;  LDL, HDL, Triglycerides, and  Waist Circumference were discussed in detail.  2) BP/HTN: His blood pressure is controlled to target.   Continue current medications including ACEI/ARB. 3) Lipids/HPL:   His  recent lipid panel showed controlled LDL at 78 improving from 100.  He is advised to continue atorvastatin 20 mg p.o. nightly along with omega-3 fatty acids 1 g p.o. twice daily.   4)  Weight/Diet: CDE Consult has been initiated , exercise, and detailed carbohydrates information provided.  5) Chronic Care/Health Maintenance:  -Patient is on ACEI/ARB and Statin medications and encouraged to continue to follow up with Ophthalmology, Podiatrist at least yearly or according to recommendations, and advised to   stay away from smoking. I have recommended yearly flu vaccine and pneumonia vaccination at least every 5 years; moderate intensity exercise for up to 150 minutes weekly; and  sleep for at least 7 hours a day.  - Patient to bring meter and  blood glucose logs during his next visit.   - I advised patient to maintain close follow up with Jacquelin Hawking, PA-C for primary care needs.  - Time spent with the patient: 25 min, of which >50% was spent in  reviewing his current and  previous labs and insulin doses and developing a plan to avoid hypo- and hyper-glycemia. Please refer to Patient Instructions for Blood Glucose Monitoring and Insulin/Medications Dosing Guide"  in media tab for additional information. Adam Long participated in the discussions, expressed understanding, and voiced agreement with the above plans.  All questions were answered to his satisfaction. he is encouraged to contact clinic should he have any questions or concerns prior to his return visit.   Follow up plan: - Return in about 1 week (around 03/23/2018) for follow up with meter and logs- no labs.  Marquis Lunch, MD Phone: [phone]  Fax: [phone]   03/16/2018, 1:45 PM

## 2018-03-16 NOTE — Patient Instructions (Signed)

## 2018-03-21 ENCOUNTER — Other Ambulatory Visit: Payer: Self-pay | Admitting: Physician Assistant

## 2018-03-23 ENCOUNTER — Ambulatory Visit: Payer: Self-pay | Admitting: "Endocrinology

## 2018-04-12 ENCOUNTER — Ambulatory Visit: Payer: Self-pay | Admitting: "Endocrinology

## 2018-04-17 ENCOUNTER — Encounter: Payer: Self-pay | Admitting: Physician Assistant

## 2018-05-24 ENCOUNTER — Ambulatory Visit: Payer: Self-pay | Admitting: Physician Assistant

## 2018-05-25 ENCOUNTER — Other Ambulatory Visit: Payer: Self-pay | Admitting: Physician Assistant

## 2018-05-31 ENCOUNTER — Encounter: Payer: Self-pay | Admitting: Physician Assistant

## 2018-05-31 ENCOUNTER — Ambulatory Visit: Payer: Self-pay | Admitting: Physician Assistant

## 2018-05-31 VITALS — BP 125/84 | HR 85

## 2018-05-31 DIAGNOSIS — E785 Hyperlipidemia, unspecified: Secondary | ICD-10-CM

## 2018-05-31 DIAGNOSIS — I10 Essential (primary) hypertension: Secondary | ICD-10-CM

## 2018-05-31 DIAGNOSIS — Z794 Long term (current) use of insulin: Secondary | ICD-10-CM

## 2018-05-31 DIAGNOSIS — E1165 Type 2 diabetes mellitus with hyperglycemia: Secondary | ICD-10-CM

## 2018-05-31 DIAGNOSIS — IMO0001 Reserved for inherently not codable concepts without codable children: Secondary | ICD-10-CM

## 2018-05-31 NOTE — Patient Instructions (Signed)
Financial counselor [phone]

## 2018-05-31 NOTE — Progress Notes (Signed)
BP 125/84   Pulse 85   SpO2 96%    Subjective:    Patient ID: Adam Long, male    DOB: 19-Feb-1976, 42 y.o.   MRN: [id number]  HPI: Adam Long is a 42 y.o. male presenting on 05/31/2018 for Diabetes; Hypertension; and Hyperlipidemia   HPI   Pt cancelled appt with endocrinology due to death of his brother.   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   He has normal grief but denies depression.  He has no other issues today  Relevant past medical, surgical, family and social history reviewed and updated as indicated. Interim medical history since our last visit reviewed. Allergies and medications reviewed and updated.   Current Outpatient Medications:  .  atorvastatin (LIPITOR) 20 MG tablet, TAKE 1 Tablet BY MOUTH ONCE DAILY, Disp: 90 tablet, Rfl: 0 .  insulin aspart (NOVOLOG) 100 UNIT/ML injection, Inject 10-16 Units into the skin 3 (three) times daily before meals., Disp: , Rfl:  .  insulin glargine (LANTUS) 100 UNIT/ML injection, Inject 80 Units into the skin at bedtime., Disp: , Rfl:  .  lisinopril (PRINIVIL,ZESTRIL) 20 MG tablet, TAKE 1 Tablet BY MOUTH ONCE DAILY FOR BLOOD PRESSURE, Disp: 90 tablet, Rfl: 3 .  loperamide (IMODIUM) 2 MG capsule, Take 4 mg by mouth as needed for diarrhea or loose stools., Disp: , Rfl:  .  metoprolol tartrate (LOPRESSOR) 50 MG tablet, TAKE 1 Tablet  BY MOUTH TWICE DAILY FOR BLOOD PRESSURE, Disp: 180 tablet, Rfl: 3 .  Omega-3 Fatty Acids (FISH OIL) 1000 MG CAPS, Take 1 capsule by mouth 2 (two) times daily. , Disp: , Rfl:  .  sitaGLIPtin (JANUVIA) 50 MG tablet, Take 50 mg by mouth daily., Disp: , Rfl:    Review of Systems  Constitutional: Negative for appetite change, chills, diaphoresis, fatigue, fever and unexpected weight change.  HENT: Negative for congestion, dental problem, drooling, ear pain, facial swelling, hearing loss, mouth sores, sneezing, sore throat, trouble swallowing and voice change.   Eyes: Negative for pain,  discharge, redness, itching and visual disturbance.  Respiratory: Negative for cough, choking, shortness of breath and wheezing.   Cardiovascular: Negative for chest pain, palpitations and leg swelling.  Gastrointestinal: Negative for abdominal pain, blood in stool, constipation, diarrhea and vomiting.  Endocrine: Negative for cold intolerance, heat intolerance and polydipsia.  Genitourinary: Negative for decreased urine volume, dysuria and hematuria.  Musculoskeletal: Negative for arthralgias, back pain and gait problem.  Skin: Negative for rash.  Allergic/Immunologic: Negative for environmental allergies.  Neurological: Negative for seizures, syncope, light-headedness and headaches.  Hematological: Negative for adenopathy.  Psychiatric/Behavioral: Negative for agitation, dysphoric mood and suicidal ideas. The patient is not nervous/anxious.     Per HPI unless specifically indicated above     Objective:    BP 125/84   Pulse 85   SpO2 96%   Wt Readings from Last 3 Encounters:  03/16/18 (!) 400 lb (181.4 kg)  02/21/18 (!) 399 lb 8 oz (181.2 kg)  11/22/17 (!) 393 lb 12 oz (178.6 kg)    Physical Exam  Constitutional: He is oriented to person, place, and time. He appears well-developed and well-nourished.  HENT:  Head: Normocephalic and atraumatic.  Neck: Neck supple.  Cardiovascular: Normal rate and regular rhythm.  Pulmonary/Chest: Effort normal and breath sounds normal. He has no wheezes.  Abdominal: Soft. Bowel sounds are normal. There is no hepatosplenomegaly. There is no tenderness.  Musculoskeletal: He exhibits no edema.  Lymphadenopathy:  He has no cervical adenopathy.  Neurological: He is alert and oriented to person, place, and time.  Skin: Skin is warm and dry.  Psychiatric: He has a normal mood and affect. His behavior is normal.  Vitals reviewed.       Assessment & Plan:    Encounter Diagnoses  Name Primary?  . Essential hypertension, benign Yes  .  Uncontrolled type 2 diabetes mellitus without complication, with long-term current use of insulin (HCC)   . Hyperlipidemia, unspecified hyperlipidemia type   . Morbid obesity, unspecified obesity type (HCC)     -will update labs -Pt to call endocriniology to reschedule appointment.  -Counseled weight management -pt to continue current medications -pt to check on his [facility] to make sure it is still active -pt to follow up 4 months.  RTO sooner prn

## 2018-06-01 ENCOUNTER — Other Ambulatory Visit (HOSPITAL_COMMUNITY)
Admission: RE | Admit: 2018-06-01 | Discharge: 2018-06-01 | Disposition: A | Discharge status: Home / Self Care | Payer: Self-pay | Source: Ambulatory Visit | Attending: Physician Assistant | Admitting: Physician Assistant

## 2018-06-01 DIAGNOSIS — Z794 Long term (current) use of insulin: Secondary | ICD-10-CM | POA: Insufficient documentation

## 2018-06-01 DIAGNOSIS — E785 Hyperlipidemia, unspecified: Secondary | ICD-10-CM | POA: Insufficient documentation

## 2018-06-01 DIAGNOSIS — E1165 Type 2 diabetes mellitus with hyperglycemia: Secondary | ICD-10-CM | POA: Insufficient documentation

## 2018-06-01 DIAGNOSIS — I10 Essential (primary) hypertension: Secondary | ICD-10-CM | POA: Insufficient documentation

## 2018-06-01 DIAGNOSIS — IMO0001 Reserved for inherently not codable concepts without codable children: Secondary | ICD-10-CM

## 2018-06-01 LAB — COMPREHENSIVE METABOLIC PANEL
ALT: 50 U/L — ABNORMAL HIGH (ref 0–44)
AST: 44 U/L — ABNORMAL HIGH (ref 15–41)
Albumin: 3.9 g/dL (ref 3.5–5.0)
Alkaline Phosphatase: 50 U/L (ref 38–126)
Anion gap: 12 (ref 5–15)
BUN: 16 mg/dL (ref 6–20)
CHLORIDE: 96 mmol/L — AB (ref 98–111)
CO2: 26 mmol/L (ref 22–32)
Calcium: 9.4 mg/dL (ref 8.9–10.3)
Creatinine, Ser: 0.8 mg/dL (ref 0.61–1.24)
Glucose, Bld: 290 mg/dL — ABNORMAL HIGH (ref 70–99)
POTASSIUM: 4.8 mmol/L (ref 3.5–5.1)
Sodium: 134 mmol/L — ABNORMAL LOW (ref 135–145)
TOTAL PROTEIN: 7.5 g/dL (ref 6.5–8.1)
Total Bilirubin: 1 mg/dL (ref 0.3–1.2)

## 2018-06-01 LAB — HEMOGLOBIN A1C
HEMOGLOBIN A1C: 9.6 % — AB (ref 4.8–5.6)
MEAN PLASMA GLUCOSE: 228.82 mg/dL

## 2018-06-01 LAB — LIPID PANEL
CHOL/HDL RATIO: 4.8 ratio
CHOLESTEROL: 164 mg/dL (ref 0–200)
HDL: 34 mg/dL — AB (ref >40)
LDL CALC: 75 mg/dL (ref 0–99)
TRIGLYCERIDES: 274 mg/dL — AB (ref <150)
VLDL: 55 mg/dL — AB (ref 0–40)

## 2018-06-14 ENCOUNTER — Other Ambulatory Visit: Payer: Self-pay | Admitting: Physician Assistant

## 2018-06-14 DIAGNOSIS — E785 Hyperlipidemia, unspecified: Secondary | ICD-10-CM

## 2018-06-14 DIAGNOSIS — E1165 Type 2 diabetes mellitus with hyperglycemia: Secondary | ICD-10-CM

## 2018-06-14 DIAGNOSIS — IMO0001 Reserved for inherently not codable concepts without codable children: Secondary | ICD-10-CM

## 2018-06-14 DIAGNOSIS — Z794 Long term (current) use of insulin: Secondary | ICD-10-CM

## 2018-06-14 DIAGNOSIS — I10 Essential (primary) hypertension: Secondary | ICD-10-CM

## 2018-08-21 ENCOUNTER — Other Ambulatory Visit: Payer: Self-pay | Admitting: Physician Assistant

## 2018-09-12 ENCOUNTER — Other Ambulatory Visit: Payer: Self-pay

## 2018-09-12 ENCOUNTER — Encounter (HOSPITAL_COMMUNITY): Payer: Self-pay

## 2018-09-12 ENCOUNTER — Emergency Department (HOSPITAL_COMMUNITY)
Admission: EM | Admit: 2018-09-12 | Discharge: 2018-09-12 | Disposition: A | Discharge status: Home / Self Care | Payer: Self-pay | Attending: Emergency Medicine | Admitting: Emergency Medicine

## 2018-09-12 DIAGNOSIS — Z794 Long term (current) use of insulin: Secondary | ICD-10-CM | POA: Insufficient documentation

## 2018-09-12 DIAGNOSIS — E119 Type 2 diabetes mellitus without complications: Secondary | ICD-10-CM | POA: Insufficient documentation

## 2018-09-12 DIAGNOSIS — Z79899 Other long term (current) drug therapy: Secondary | ICD-10-CM | POA: Insufficient documentation

## 2018-09-12 DIAGNOSIS — L02212 Cutaneous abscess of back [any part, except buttock]: Secondary | ICD-10-CM | POA: Insufficient documentation

## 2018-09-12 DIAGNOSIS — I10 Essential (primary) hypertension: Secondary | ICD-10-CM | POA: Insufficient documentation

## 2018-09-12 MED ORDER — DOXYCYCLINE HYCLATE 100 MG PO TABS
100.0000 mg | ORAL_TABLET | Freq: Once | ORAL | Status: AC
  Administered 2018-09-12: 100 mg via ORAL
  Filled 2018-09-12: qty 1

## 2018-09-12 MED ORDER — DOXYCYCLINE HYCLATE 100 MG PO CAPS: 100.0000 mg | ORAL_CAPSULE | Freq: Two times a day (BID) | ORAL | 0 refills | Status: AC

## 2018-09-12 MED ORDER — LIDOCAINE HCL (PF) 1 % IJ SOLN
INTRAMUSCULAR | Status: AC
  Administered 2018-09-12: 2 mL
  Filled 2018-09-12: qty 6

## 2018-09-12 NOTE — ED Triage Notes (Signed)
Pt reports abscess to mid upper back x 2 or 3 days.  Area red and raised.  Pts says had it drained a few years ago.

## 2018-09-12 NOTE — Discharge Instructions (Signed)
Please see the information and instructions below regarding your visit.  Your diagnoses today include:  1. Abscess     Abscesses form when an infection in your skin starts to collect bacteria and white blood cells, walling it off from the rest of your body to protect you from a bigger infection. Risk factors for this type of infection include:  ?Break in the skin ?Diabetes ?Swollen areas  Sometimes the infection starts to spread to surrounding tissue, causing redness and swelling. We call this cellulitis.   Tests performed today include: See side panel of your discharge paperwork for testing performed today. Vital signs are listed at the bottom of these instructions.   Medications prescribed:    Take any prescribed medications only as prescribed, and any over the counter medications only as directed on the packaging.  1. Doxycycline is an antibiotic that fights infection in the skin. This medication can make your skin sensitive to the sun, so please ensure that you wear sunscreen, hats, or other coverage over your skin while taking this. This medicine CANNOT be taken by women while pregnant, breastfeeding, or trying to become pregnant.  Please speak with a healthcare provider if any of these situations apply to you.  2. I recommend ibuprofen, a non-steroidal anti-inflammatory agent (NSAID) for pain. You may take 600 mg every 6  hours as needed for pain. If still requiring this medication around the clock for acute pain after 10 days, please see your primary healthcare provider.  Women who are pregnant, breastfeeding, or planning on becoming pregnant should not take non-steroidal anti-inflammatories such as Advil and Aleve. Tylenol is a safe over the counter pain reliever in pregnant women.  You may combine this medication with Tylenol, 650 mg every 6 hours, so you are receiving something for pain every 3 hours.  This is not a long-term medication unless under the care and direction of  your primary provider. Taking this medication long-term and not under the supervision of a healthcare provider could increase the risk of stomach ulcers, kidney problems, and cardiovascular problems such as high blood pressure.    Home care instructions:  Please follow any educational materials contained in this packet.   Some things that may promote healing of your wound and infection include:  Keep the infected area clean and dry. You can take a shower or bath, but be sure to pat the area dry with a towel afterward. Do not put any antibiotic ointments or creams on the area. Reapply a dry gauze dressing any time the bandage has become soaked with drainage, or after cleansing the wound.  Apply warm compresses to the wound 3-4 times daily to encourage drainage.   Return instructions:  Please return to the Emergency Department if you experience worsening symptoms. You should return for reevaluation of your infection if you notice spreading redness, increased swelling, an abscess develops, or you develop signs and symptoms of a systemic illness such as fever and chills.  Please return if you have any other emergent concerns.  Additional Information:   Your vital signs today were: BP 133/81 (BP Location: Right Arm)    Pulse 84    Temp 98.1 F (36.7 C) (Oral)    Resp 18    Ht 5\' 10"  (1.778 m)    Wt (!) 170.1 kg    SpO2 100%    BMI 53.81 kg/m  If your blood pressure (BP) was elevated on multiple readings during this visit above 130 for the top number or  above 80 for the bottom number, please have this repeated by your primary care provider within one month. --------------  Thank you for allowing [place] to participate in your care today.

## 2018-09-12 NOTE — ED Provider Notes (Signed)
[facility] EMERGENCY DEPARTMENT Provider Note   CSN: [id number] Arrival date & time: 09/12/18  0845     History   Chief Complaint Chief Complaint  Patient presents with  . Abscess    HPI Adam Long is a 42 y.o. male.  HPI  Patient is a 42 year old male with a history of obesity, hypercholesterolemia, hypertension, diabetes mellitus presenting for abscess at the back.  Patient reports he has recurrent abscesses in the mid upper back to the left of midline.  He reports he has had to have this drained least twice.  Patient reports that is been progressively swelling and has increasing erythema over the past 2 to 3 days.  No active drainage.  Patient denies fevers or chills.  Patient denies any pain rating down the back, or any abnormal sensations in lower extremities.  Past Medical History:  Diagnosis Date  . Anxiety   . Cholelithiasis   . Depression   . Diabetes mellitus   . Hypercholesteremia   . Hypertension   . Morbid obesity [facility])     Patient Active Problem List   Diagnosis Date Noted  . Personal history of noncompliance with medical treatment, presenting hazards to health 03/16/2018  . Uncontrolled type 2 diabetes mellitus with complication (HCC) 10/12/2015  . Mixed hyperlipidemia 10/12/2015  . Morbid obesity (HCC) 10/12/2015  . Essential hypertension, benign 10/12/2015  . Chest pain 07/02/2013  . HTN (hypertension) 07/02/2013  . DM (diabetes mellitus) (HCC) 07/02/2013    History reviewed. No pertinent surgical history.      Home Medications    Prior to Admission medications   Medication Sig Start Date End Date Taking? Authorizing Provider  atorvastatin (LIPITOR) 20 MG tablet TAKE 1 Tablet BY MOUTH ONCE DAILY 08/21/18   Jacquelin Hawking, PA-C  insulin aspart (NOVOLOG) 100 UNIT/ML injection Inject 10-16 Units into the skin 3 (three) times daily before meals.    [provider]  insulin glargine (LANTUS) 100 UNIT/ML injection Inject 80 Units  into the skin at bedtime.    [provider]  lisinopril (PRINIVIL,ZESTRIL) 20 MG tablet TAKE 1 Tablet BY MOUTH ONCE DAILY FOR BLOOD PRESSURE 03/21/18   Jacquelin Hawking, PA-C  loperamide (IMODIUM) 2 MG capsule Take 4 mg by mouth as needed for diarrhea or loose stools.    [provider]  metoprolol tartrate (LOPRESSOR) 50 MG tablet TAKE 1 Tablet  BY MOUTH TWICE DAILY FOR BLOOD PRESSURE 03/21/18   Jacquelin Hawking, PA-C  Omega-3 Fatty Acids (FISH OIL) 1000 MG CAPS Take 1 capsule by mouth 2 (two) times daily.     [provider]  sitaGLIPtin (JANUVIA) 50 MG tablet Take 50 mg by mouth daily.    [provider]    Family History Family History  Problem Relation Age of Onset  . Cancer Mother        brain  . Heart disease Father   . Hyperlipidemia Father   . Hypertension Father   . Stroke Father     Social History Social History   Tobacco Use  . Smoking status: Never Smoker  . Smokeless tobacco: Former Neurosurgeon    Types: Chew  Substance Use Topics  . Alcohol use: Yes    Comment: occasionally  . Drug use: No     Allergies   Pollen extract   Review of Systems Review of Systems  Constitutional: Negative for chills and fever.  Musculoskeletal: Negative for back pain.  Skin: Positive for color change. Negative for wound.  Physical Exam Updated Vital Signs BP 133/81 (BP Location: Right Arm)   Pulse 84   Temp 98.1 F (36.7 C) (Oral)   Resp 18   Ht 5\' 10"  (1.778 m)   Wt (!) 170.1 kg   SpO2 100%   BMI 53.81 kg/m   Physical Exam Vitals signs and nursing note reviewed.  Constitutional:      General: He is not in acute distress.    Appearance: He is well-developed. He is not diaphoretic.     Comments: Sitting comfortably in bed.  HENT:     Head: Normocephalic and atraumatic.  Eyes:     General:        Right eye: No discharge.        Left eye: No discharge.     Conjunctiva/sclera: Conjunctivae normal.     Comments: EOMs normal to  gross examination.  Neck:     Musculoskeletal: Normal range of motion.  Cardiovascular:     Rate and Rhythm: Normal rate and regular rhythm.     Comments: Intact, 2+ radial pulse. Pulmonary:     Comments: Patient converses comfortably without audible wheeze or stridor. Abdominal:     General: There is no distension.  Musculoskeletal: Normal range of motion.  Skin:    General: Skin is warm and dry.     Comments: Patient exhibits approximately 2 cm area of fluctuance in the mid thoracic region to the left of midline.  There is mild surrounding erythema.  No spinal tenderness to palpation.  Neurological:     Mental Status: He is alert.     Comments: Cranial nerves intact to gross observation. Patient moves extremities without difficulty.  Psychiatric:        Behavior: Behavior normal.        Thought Content: Thought content normal.        Judgment: Judgment normal.      ED Treatments / Results  Labs (all labs ordered are listed, but only abnormal results are displayed) Labs Reviewed - No data to display  EKG None  Radiology No results found.  Procedures .Marland Kitchen.Incision and Drainage Date/Time: 09/12/2018 4:47 PM Performed by: Elisha PonderMurray, Verlaine Embry B, PA-C Authorized by: Elisha PonderMurray, Tharon Kitch B, PA-C   Consent:    Consent obtained:  Verbal   Consent given by:  Patient   Risks discussed:  Bleeding, incomplete drainage and pain Location:    Type:  Abscess   Location:  Trunk   Trunk location:  Back Pre-procedure details:    Skin preparation:  Chloraprep Anesthesia (see MAR for exact dosages):    Anesthesia method:  Local infiltration   Local anesthetic:  Lidocaine 1% w/o epi Procedure type:    Complexity:  Simple Procedure details:    Incision types:  Stab incision   Incision depth:  Dermal   Scalpel blade:  11   Wound management:  Probed and deloculated and irrigated with saline   Drainage:  Purulent   Drainage amount:  Moderate   Wound treatment:  Drain placed   Packing  materials:  1/4 in iodoform gauze Post-procedure details:    Patient tolerance of procedure:  Tolerated well, no immediate complications   (including critical care time)  Medications Ordered in ED Medications  lidocaine (PF) (XYLOCAINE) 1 % injection (has no administration in time range)     Initial Impression / Assessment and Plan / ED Course  I have reviewed the triage vital signs and the nursing notes.  Pertinent labs & imaging results that were available  during my care of the patient were reviewed by me and considered in my medical decision making (see chart for details).     Patient nontoxic-appearing, afebrile, and hemodynamically stable.  Patient with recurrent abscess of the mid thoracic region.  Question sebaceous cyst given recurrence.  No capsule was identified on incision and drainage.  Procedure performed successfully with no complications.  Packing placed.  Patient given instructions on proper wound care including warm compresses, recheck at primary care provider in 48 hours for removal of packing and wound evaluation.  Given surrounding erythema, patient placed on doxycycline.  Return precautions given for any recurrence of abscess, increasing redness or drainage, or fevers or chills.  Patient is in understanding and agrees with the plan of care.  Final Clinical Impressions(s) / ED Diagnoses   Final diagnoses:  Abscess of back    ED Discharge Orders         Ordered    doxycycline (VIBRAMYCIN) 100 MG capsule  2 times daily     09/12/18 1134           Elisha Ponder, [place] 09/12/18 1754    Pricilla Loveless, MD 09/13/18 1622

## 2018-10-03 ENCOUNTER — Other Ambulatory Visit: Payer: Self-pay | Admitting: Physician Assistant

## 2018-10-04 ENCOUNTER — Ambulatory Visit: Payer: Self-pay | Admitting: Physician Assistant

## 2019-03-23 ENCOUNTER — Other Ambulatory Visit: Payer: Self-pay | Admitting: Physician Assistant

## 2019-06-07 ENCOUNTER — Encounter: Payer: Self-pay | Admitting: Physician Assistant

## 2019-06-07 ENCOUNTER — Ambulatory Visit: Payer: Self-pay | Admitting: Physician Assistant

## 2019-06-07 DIAGNOSIS — Z9119 Patient's noncompliance with other medical treatment and regimen: Secondary | ICD-10-CM

## 2019-06-07 DIAGNOSIS — Z91199 Patient's noncompliance with other medical treatment and regimen due to unspecified reason: Secondary | ICD-10-CM

## 2019-06-07 DIAGNOSIS — I10 Essential (primary) hypertension: Secondary | ICD-10-CM

## 2019-06-07 DIAGNOSIS — IMO0001 Reserved for inherently not codable concepts without codable children: Secondary | ICD-10-CM

## 2019-06-07 DIAGNOSIS — E785 Hyperlipidemia, unspecified: Secondary | ICD-10-CM

## 2019-06-07 MED ORDER — LISINOPRIL 20 MG PO TABS: ORAL_TABLET | ORAL | 0 refills | Status: DC

## 2019-06-07 MED ORDER — ATORVASTATIN CALCIUM 20 MG PO TABS: 20.0000 mg | ORAL_TABLET | Freq: Every day | ORAL | 0 refills | Status: DC

## 2019-06-07 MED ORDER — METOPROLOL TARTRATE 50 MG PO TABS: 50.0000 mg | ORAL_TABLET | Freq: Two times a day (BID) | ORAL | 0 refills | Status: DC

## 2019-06-07 NOTE — Progress Notes (Signed)
There were no vitals taken for this visit.   Subjective:    Patient ID: Adam Long, male    DOB: 1976-08-06, 43 y.o.   MRN: [id number]  HPI: Adam Long is a 43 y.o. male presenting on 06/07/2019 for No chief complaint on file.   HPI   This is a telemedicine appointment through [employer] due to coronavirus pandemic.    I connected with  Adam Long on 06/07/2019  by a video enabled telemedicine application and verified that I am speaking with the correct person using two identifiers.   I discussed the limitations of evaluation and management by telemedicine. The patient expressed understanding and agreed to proceed.  Pt is at home.  Provider is at office.      Pt hasn't been seen in over a year,  "just because", he says.  He tried to say that his not coming in for his appointments was due to covid, but his appointment that he missed was in early January prior to the outbreak in this country.Marland Kitchen  His   Last OV was 05/31/2018  He says he occassionally checks his sugar- yesterday morning it was 203.    He still has some janumet and metoprolol left that he is taking- he has some left because he is not taking either bid every day as prescribed.  He has ran out of his other medications.    He says he is doing okay.  He has no complaints and denies fever, cp, abd pain, sob.    Relevant past medical, surgical, family and social history reviewed and updated as indicated. Interim medical history since our last visit reviewed. Allergies and medications reviewed and updated.   Current Outpatient Medications:  .  metoprolol tartrate (LOPRESSOR) 50 MG tablet, TAKE 1 Tablet  BY MOUTH TWICE DAILY FOR BLOOD PRESSURE, Disp: 180 tablet, Rfl: 3 .  sitaGLIPtin-metformin (JANUMET) 50-500 MG tablet, Take 1 tablet by mouth 2 (two) times daily with a meal., Disp: , Rfl:     Review of Systems  Per HPI unless specifically indicated above     Objective:    There were no vitals taken for this  visit.  Wt Readings from Last 3 Encounters:  09/12/18 (!) 375 lb (170.1 kg)  03/16/18 (!) 400 lb (181.4 kg)  02/21/18 (!) 399 lb 8 oz (181.2 kg)    Physical Exam Constitutional:      General: He is not in acute distress.    Appearance: He is obese. He is not ill-appearing.  HENT:     Head: Normocephalic and atraumatic.  Pulmonary:     Effort: Pulmonary effort is normal. No respiratory distress.  Neurological:     Mental Status: He is alert and oriented to person, place, and time.  Psychiatric:        Attention and Perception: Attention normal.        Speech: Speech normal.        Behavior: Behavior is cooperative.           Assessment & Plan:   Encounter Diagnoses  Name Primary?  Marland Kitchen Uncontrolled type 2 diabetes mellitus without complication, with long-term current use of insulin ([place]) Yes  . Essential hypertension, benign   . Hyperlipidemia, unspecified hyperlipidemia type   . Morbid obesity, unspecified obesity type ([place])   . Personal history of noncompliance with medical treatment, presenting hazards to health       -counseled pt on need for regular medical care to minimize damage  from his medical conditions -pt to get fasting labs drawn tomorrow morning.   -sent refills atorvastatin, lisinopril and metoprolol to local pharmacy.  He can pick up samples of janumet from office -he needs to contact care connect to renew certification and update with medassist -pt to follow up in office in 3 weeks to review labs and make sure he is back on his medications.  Pt to contact office sooner prn

## 2019-06-11 ENCOUNTER — Emergency Department (HOSPITAL_COMMUNITY): Payer: Self-pay

## 2019-06-11 ENCOUNTER — Inpatient Hospital Stay (HOSPITAL_COMMUNITY): Payer: Self-pay

## 2019-06-11 ENCOUNTER — Encounter (HOSPITAL_COMMUNITY): Payer: Self-pay | Admitting: Emergency Medicine

## 2019-06-11 ENCOUNTER — Inpatient Hospital Stay (HOSPITAL_COMMUNITY)
Admission: EM | Admit: 2019-06-11 | Discharge: 2019-06-14 | DRG: 300 | Disposition: A | Discharge status: Home Health | Payer: Self-pay | Attending: Internal Medicine | Admitting: Internal Medicine

## 2019-06-11 ENCOUNTER — Other Ambulatory Visit: Payer: Self-pay

## 2019-06-11 DIAGNOSIS — R55 Syncope and collapse: Secondary | ICD-10-CM

## 2019-06-11 DIAGNOSIS — Z23 Encounter for immunization: Secondary | ICD-10-CM

## 2019-06-11 DIAGNOSIS — A419 Sepsis, unspecified organism: Secondary | ICD-10-CM

## 2019-06-11 DIAGNOSIS — L03119 Cellulitis of unspecified part of limb: Secondary | ICD-10-CM | POA: Diagnosis present

## 2019-06-11 DIAGNOSIS — F329 Major depressive disorder, single episode, unspecified: Secondary | ICD-10-CM | POA: Diagnosis present

## 2019-06-11 DIAGNOSIS — E782 Mixed hyperlipidemia: Secondary | ICD-10-CM | POA: Diagnosis present

## 2019-06-11 DIAGNOSIS — I110 Hypertensive heart disease with heart failure: Secondary | ICD-10-CM | POA: Diagnosis present

## 2019-06-11 DIAGNOSIS — Z808 Family history of malignant neoplasm of other organs or systems: Secondary | ICD-10-CM

## 2019-06-11 DIAGNOSIS — E1152 Type 2 diabetes mellitus with diabetic peripheral angiopathy with gangrene: Principal | ICD-10-CM | POA: Diagnosis present

## 2019-06-11 DIAGNOSIS — E869 Volume depletion, unspecified: Secondary | ICD-10-CM | POA: Diagnosis present

## 2019-06-11 DIAGNOSIS — E871 Hypo-osmolality and hyponatremia: Secondary | ICD-10-CM | POA: Diagnosis present

## 2019-06-11 DIAGNOSIS — Z8349 Family history of other endocrine, nutritional and metabolic diseases: Secondary | ICD-10-CM

## 2019-06-11 DIAGNOSIS — L02419 Cutaneous abscess of limb, unspecified: Secondary | ICD-10-CM | POA: Diagnosis present

## 2019-06-11 DIAGNOSIS — I10 Essential (primary) hypertension: Secondary | ICD-10-CM | POA: Diagnosis present

## 2019-06-11 DIAGNOSIS — E118 Type 2 diabetes mellitus with unspecified complications: Secondary | ICD-10-CM | POA: Diagnosis present

## 2019-06-11 DIAGNOSIS — L03032 Cellulitis of left toe: Secondary | ICD-10-CM | POA: Diagnosis present

## 2019-06-11 DIAGNOSIS — Y92000 Kitchen of unspecified non-institutional (private) residence as  the place of occurrence of the external cause: Secondary | ICD-10-CM

## 2019-06-11 DIAGNOSIS — Z79899 Other long term (current) drug therapy: Secondary | ICD-10-CM

## 2019-06-11 DIAGNOSIS — I5032 Chronic diastolic (congestive) heart failure: Secondary | ICD-10-CM | POA: Diagnosis present

## 2019-06-11 DIAGNOSIS — E1169 Type 2 diabetes mellitus with other specified complication: Secondary | ICD-10-CM | POA: Diagnosis present

## 2019-06-11 DIAGNOSIS — Z823 Family history of stroke: Secondary | ICD-10-CM

## 2019-06-11 DIAGNOSIS — I350 Nonrheumatic aortic (valve) stenosis: Secondary | ICD-10-CM | POA: Diagnosis present

## 2019-06-11 DIAGNOSIS — E1165 Type 2 diabetes mellitus with hyperglycemia: Secondary | ICD-10-CM | POA: Diagnosis present

## 2019-06-11 DIAGNOSIS — Z9119 Patient's noncompliance with other medical treatment and regimen: Secondary | ICD-10-CM

## 2019-06-11 DIAGNOSIS — S91332A Puncture wound without foreign body, left foot, initial encounter: Secondary | ICD-10-CM | POA: Diagnosis present

## 2019-06-11 DIAGNOSIS — L03116 Cellulitis of left lower limb: Secondary | ICD-10-CM | POA: Diagnosis present

## 2019-06-11 DIAGNOSIS — Z6841 Body Mass Index (BMI) 40.0 and over, adult: Secondary | ICD-10-CM

## 2019-06-11 DIAGNOSIS — F419 Anxiety disorder, unspecified: Secondary | ICD-10-CM | POA: Diagnosis present

## 2019-06-11 DIAGNOSIS — Z20828 Contact with and (suspected) exposure to other viral communicable diseases: Secondary | ICD-10-CM | POA: Diagnosis present

## 2019-06-11 DIAGNOSIS — Z8249 Family history of ischemic heart disease and other diseases of the circulatory system: Secondary | ICD-10-CM

## 2019-06-11 DIAGNOSIS — E785 Hyperlipidemia, unspecified: Secondary | ICD-10-CM | POA: Diagnosis present

## 2019-06-11 DIAGNOSIS — Z91199 Patient's noncompliance with other medical treatment and regimen due to unspecified reason: Secondary | ICD-10-CM

## 2019-06-11 DIAGNOSIS — Z7984 Long term (current) use of oral hypoglycemic drugs: Secondary | ICD-10-CM

## 2019-06-11 DIAGNOSIS — W268XXA Contact with other sharp object(s), not elsewhere classified, initial encounter: Secondary | ICD-10-CM

## 2019-06-11 DIAGNOSIS — Z87891 Personal history of nicotine dependence: Secondary | ICD-10-CM

## 2019-06-11 HISTORY — DX: Cellulitis of unspecified part of limb: L03.119

## 2019-06-11 HISTORY — DX: Cutaneous abscess of limb, unspecified: L02.419

## 2019-06-11 LAB — COMPREHENSIVE METABOLIC PANEL
ALT: 85 U/L — ABNORMAL HIGH (ref 0–44)
AST: 114 U/L — ABNORMAL HIGH (ref 15–41)
Albumin: 3.4 g/dL — ABNORMAL LOW (ref 3.5–5.0)
Alkaline Phosphatase: 74 U/L (ref 38–126)
Anion gap: 15 (ref 5–15)
BUN: 12 mg/dL (ref 6–20)
CO2: 19 mmol/L — ABNORMAL LOW (ref 22–32)
Calcium: 8.9 mg/dL (ref 8.9–10.3)
Chloride: 97 mmol/L — ABNORMAL LOW (ref 98–111)
Creatinine, Ser: 0.7 mg/dL (ref 0.61–1.24)
GFR calc Af Amer: >60 mL/min (ref >60)
GFR calc non Af Amer: >60 mL/min (ref >60)
Glucose, Bld: 408 mg/dL — ABNORMAL HIGH (ref 70–99)
Potassium: 3.8 mmol/L (ref 3.5–5.1)
Sodium: 131 mmol/L — ABNORMAL LOW (ref 135–145)
Total Bilirubin: 1.4 mg/dL — ABNORMAL HIGH (ref 0.3–1.2)
Total Protein: 7.1 g/dL (ref 6.5–8.1)

## 2019-06-11 LAB — CBC WITH DIFFERENTIAL/PLATELET
Abs Immature Granulocytes: 0.08 10*3/uL — ABNORMAL HIGH (ref 0.00–0.07)
Basophils Absolute: 0.1 10*3/uL (ref 0.0–0.1)
Basophils Relative: 1 %
Eosinophils Absolute: 0.1 10*3/uL (ref 0.0–0.5)
Eosinophils Relative: 1 %
HCT: 46.1 % (ref 39.0–52.0)
Hemoglobin: 15 g/dL (ref 13.0–17.0)
Immature Granulocytes: 1 %
Lymphocytes Relative: 12 %
Lymphs Abs: 1.2 10*3/uL (ref 0.7–4.0)
MCH: 29.4 pg (ref 26.0–34.0)
MCHC: 32.5 g/dL (ref 30.0–36.0)
MCV: 90.4 fL (ref 80.0–100.0)
Monocytes Absolute: 0.7 10*3/uL (ref 0.1–1.0)
Monocytes Relative: 7 %
Neutro Abs: 8.4 10*3/uL — ABNORMAL HIGH (ref 1.7–7.7)
Neutrophils Relative %: 78 %
Platelets: 189 10*3/uL (ref 150–400)
RBC: 5.1 MIL/uL (ref 4.22–5.81)
RDW: 12.8 % (ref 11.5–15.5)
WBC: 10.6 10*3/uL — ABNORMAL HIGH (ref 4.0–10.5)
nRBC: 0 % (ref 0.0–0.2)

## 2019-06-11 LAB — URINALYSIS, ROUTINE W REFLEX MICROSCOPIC
Bilirubin Urine: NEGATIVE
Glucose, UA: >=500 mg/dL — AB
Hgb urine dipstick: NEGATIVE
Ketones, ur: 20 mg/dL — AB
Nitrite: NEGATIVE
Protein, ur: 100 mg/dL — AB
Specific Gravity, Urine: 1.022 (ref 1.005–1.030)
pH: 6 (ref 5.0–8.0)

## 2019-06-11 LAB — SARS CORONAVIRUS 2 BY RT PCR (HOSPITAL ORDER, PERFORMED IN ~~LOC~~ HOSPITAL LAB): SARS Coronavirus 2: NEGATIVE

## 2019-06-11 LAB — PROTIME-INR
INR: 1.1 (ref 0.8–1.2)
Prothrombin Time: 13.8 seconds (ref 11.4–15.2)

## 2019-06-11 LAB — GLUCOSE, CAPILLARY
Glucose-Capillary: 355 mg/dL — ABNORMAL HIGH (ref 70–99)
Glucose-Capillary: 351 mg/dL — ABNORMAL HIGH (ref 70–99)

## 2019-06-11 LAB — LACTIC ACID, PLASMA
Lactic Acid, Venous: 1.9 mmol/L (ref 0.5–1.9)
Lactic Acid, Venous: 1.7 mmol/L (ref 0.5–1.9)

## 2019-06-11 MED ORDER — SODIUM CHLORIDE 0.9 % IV SOLN: 250.0000 mL | INTRAVENOUS | Status: DC | PRN

## 2019-06-11 MED ORDER — LINAGLIPTIN 5 MG PO TABS
5.0000 mg | ORAL_TABLET | Freq: Every day | ORAL | Status: DC
  Administered 2019-06-11 – 2019-06-14 (×4): 5 mg via ORAL
  Filled 2019-06-11 (×4): qty 1

## 2019-06-11 MED ORDER — ONDANSETRON HCL 4 MG/2ML IJ SOLN: 4.0000 mg | Freq: Four times a day (QID) | INTRAMUSCULAR | Status: DC | PRN

## 2019-06-11 MED ORDER — SITAGLIPTIN PHOS-METFORMIN HCL 50-500 MG PO TABS: 1.0000 | ORAL_TABLET | Freq: Two times a day (BID) | ORAL | Status: DC

## 2019-06-11 MED ORDER — INFLUENZA VAC SPLIT QUAD 0.5 ML IM SUSY
0.5000 mL | PREFILLED_SYRINGE | INTRAMUSCULAR | Status: AC
  Administered 2019-06-12: 0.5 mL via INTRAMUSCULAR
  Filled 2019-06-11: qty 0.5

## 2019-06-11 MED ORDER — SODIUM CHLORIDE 0.9 % IV BOLUS
3000.0000 mL | Freq: Once | INTRAVENOUS | Status: AC
  Administered 2019-06-11: 3000 mL via INTRAVENOUS

## 2019-06-11 MED ORDER — HEPARIN SODIUM (PORCINE) 5000 UNIT/ML IJ SOLN
5000.0000 [IU] | Freq: Three times a day (TID) | INTRAMUSCULAR | Status: DC
  Administered 2019-06-11 – 2019-06-14 (×8): 5000 [IU] via SUBCUTANEOUS
  Filled 2019-06-11 (×8): qty 1

## 2019-06-11 MED ORDER — INSULIN ASPART 100 UNIT/ML ~~LOC~~ SOLN
0.0000 [IU] | Freq: Every day | SUBCUTANEOUS | Status: DC
  Administered 2019-06-11: 5 [IU] via SUBCUTANEOUS
  Administered 2019-06-12 – 2019-06-13 (×2): 3 [IU] via SUBCUTANEOUS

## 2019-06-11 MED ORDER — SODIUM CHLORIDE 0.9% FLUSH
3.0000 mL | INTRAVENOUS | Status: DC | PRN
  Administered 2019-06-12 (×2): 3 mL via INTRAVENOUS
  Filled 2019-06-11 (×2): qty 3

## 2019-06-11 MED ORDER — HYDRALAZINE HCL 20 MG/ML IJ SOLN: 10.0000 mg | Freq: Four times a day (QID) | INTRAMUSCULAR | Status: DC | PRN

## 2019-06-11 MED ORDER — VANCOMYCIN HCL IN DEXTROSE 1-5 GM/200ML-% IV SOLN
1000.0000 mg | Freq: Once | INTRAVENOUS | Status: AC
  Administered 2019-06-11: 14:00:00 1000 mg via INTRAVENOUS
  Filled 2019-06-11: qty 200

## 2019-06-11 MED ORDER — ONDANSETRON HCL 4 MG PO TABS: 4.0000 mg | ORAL_TABLET | Freq: Four times a day (QID) | ORAL | Status: DC | PRN

## 2019-06-11 MED ORDER — METFORMIN HCL 500 MG PO TABS
500.0000 mg | ORAL_TABLET | Freq: Two times a day (BID) | ORAL | Status: DC
  Administered 2019-06-11 – 2019-06-14 (×6): 500 mg via ORAL
  Filled 2019-06-11 (×6): qty 1

## 2019-06-11 MED ORDER — INSULIN ASPART 100 UNIT/ML ~~LOC~~ SOLN
0.0000 [IU] | Freq: Three times a day (TID) | SUBCUTANEOUS | Status: DC
  Administered 2019-06-11: 9 [IU] via SUBCUTANEOUS
  Administered 2019-06-12: 18:00:00 5 [IU] via SUBCUTANEOUS
  Administered 2019-06-12: 9 [IU] via SUBCUTANEOUS
  Administered 2019-06-12: 7 [IU] via SUBCUTANEOUS
  Administered 2019-06-13: 3 [IU] via SUBCUTANEOUS
  Administered 2019-06-13: 7 [IU] via SUBCUTANEOUS
  Administered 2019-06-13: 5 [IU] via SUBCUTANEOUS
  Administered 2019-06-14: 3 [IU] via SUBCUTANEOUS
  Administered 2019-06-14: 5 [IU] via SUBCUTANEOUS

## 2019-06-11 MED ORDER — SODIUM CHLORIDE 0.9% FLUSH
3.0000 mL | Freq: Two times a day (BID) | INTRAVENOUS | Status: DC
  Administered 2019-06-12 – 2019-06-13 (×3): 3 mL via INTRAVENOUS

## 2019-06-11 MED ORDER — POLYETHYLENE GLYCOL 3350 17 G PO PACK: 17.0000 g | PACK | Freq: Every day | ORAL | Status: DC | PRN

## 2019-06-11 MED ORDER — PIPERACILLIN-TAZOBACTAM 3.375 G IVPB 30 MIN
3.3750 g | Freq: Once | INTRAVENOUS | Status: AC
  Administered 2019-06-11: 3.375 g via INTRAVENOUS
  Filled 2019-06-11: qty 50

## 2019-06-11 MED ORDER — ACETAMINOPHEN 325 MG PO TABS: 650.0000 mg | ORAL_TABLET | Freq: Four times a day (QID) | ORAL | Status: DC | PRN

## 2019-06-11 MED ORDER — ALBUTEROL SULFATE (2.5 MG/3ML) 0.083% IN NEBU: 2.5000 mg | INHALATION_SOLUTION | RESPIRATORY_TRACT | Status: DC | PRN

## 2019-06-11 MED ORDER — TRAZODONE HCL 50 MG PO TABS
100.0000 mg | ORAL_TABLET | Freq: Every day | ORAL | Status: DC
  Administered 2019-06-11 – 2019-06-13 (×3): 100 mg via ORAL
  Filled 2019-06-11 (×3): qty 2

## 2019-06-11 MED ORDER — TETANUS-DIPHTH-ACELL PERTUSSIS 5-2.5-18.5 LF-MCG/0.5 IM SUSP
0.5000 mL | Freq: Once | INTRAMUSCULAR | Status: AC
  Administered 2019-06-11: 0.5 mL via INTRAMUSCULAR
  Filled 2019-06-11: qty 0.5

## 2019-06-11 MED ORDER — ACETAMINOPHEN 650 MG RE SUPP: 650.0000 mg | Freq: Four times a day (QID) | RECTAL | Status: DC | PRN

## 2019-06-11 MED ORDER — ATORVASTATIN CALCIUM 20 MG PO TABS
20.0000 mg | ORAL_TABLET | Freq: Every day | ORAL | Status: DC
  Administered 2019-06-11 – 2019-06-14 (×4): 20 mg via ORAL
  Filled 2019-06-11 (×4): qty 1

## 2019-06-11 MED ORDER — SODIUM CHLORIDE 0.9 % IV SOLN
INTRAVENOUS | Status: DC
  Administered 2019-06-11 – 2019-06-14 (×5): via INTRAVENOUS

## 2019-06-11 MED ORDER — TRAZODONE HCL 50 MG PO TABS: 50.0000 mg | ORAL_TABLET | Freq: Every evening | ORAL | Status: DC | PRN

## 2019-06-11 MED ORDER — PIPERACILLIN-TAZOBACTAM 3.375 G IVPB
3.3750 g | Freq: Three times a day (TID) | INTRAVENOUS | Status: DC
  Administered 2019-06-11 – 2019-06-14 (×8): 3.375 g via INTRAVENOUS
  Filled 2019-06-11 (×8): qty 50

## 2019-06-11 NOTE — ED Triage Notes (Signed)
Pt reports he stepped on a tack last week with his left foot. Pt's left foot red, swollen, hot to touch with necrotic appearing 4th toe. Pt also had a syncopal episode this morning while walking to the bathroom.

## 2019-06-11 NOTE — H&P (Signed)
Patient Demographics:    Adam Long, is a 43 y.o. male  MRN: [id number]   DOB - 14-May-1976  Admit Date - 06/11/2019  Outpatient Primary MD for the patient is Jacquelin Hawking, PA-C   Assessment & Plan:    Principal Problem:   Cellulitis and abscess of lower extremity Active Problems:   Uncontrolled type 2 diabetes mellitus with complication (HCC)   Morbid obesity (HCC)   HTN (hypertension)   Mixed hyperlipidemia   Personal history of noncompliance with medical treatment, presenting hazards to health    1) left foot wound infection/cellulitis--- continue Vanco and Zosyn as ordered by EDP, check MRSA PCR and de-escalate if MRSA PCR negative -EDP ordered blood cultures -Patient does not meet sepsis criteria -Given puncture wound to his neck- with a metal tack in a diabetic patient need to adequately cover for polymicrobial pathology including Pseudomonas--once clinically improved may transition from Zosyn to quinolone -Get wound care consult -Patient already received tetanus booster  2)DM2-last A1c was 9.6, patient has uncontrolled diabetes and is noncompliant--- continue Tradjenta and metformin and  Use Novolog/Humalog Sliding scale insulin with Accu-Cheks/Fingersticks as ordered  3) morbid obesity--this complicates overall care, BMI over 54, lifestyle and dietary modifications discussed  4) history of depression and anxiety--give trazodone 100 mg nightly   With History of - Reviewed by me  Past Medical History:  Diagnosis Date   Anxiety    Cholelithiasis    Depression    Diabetes mellitus    Hypercholesteremia    Hypertension    Morbid obesity (HCC)       History reviewed. No pertinent surgical history.    Chief Complaint  Patient presents with   Wound Infection      HPI:     Adam Long  is a 43 y.o. male past medical history relevant for depression/anxiety, T2DM, HTN, hypercholesterolemia, morbid obesity who presents to the ED with complaints of L foot infection & s/p near syncope episode this AM shortly PTA.  -On 06/07/2019 patient stepped on a metal tack in his kitchen, it pierced through his sneakers and punctured his lt foot--over the last few days patient has noticed increased swelling, warmth, redness---there is  Desquamation/blistering streaking of the lt foot -  No fever  Or chills No Nausea, Vomiting or Diarrhea  -This morning patient did not take his medications but he took Percocet with Diet Coke--was walking from the kitchen to the restroom felt dizzy and diaphoretic and he kind of laid himself back onto the bed without actually passing out  -No chest pains no palpitations, no pleuritic symptoms - -In ED heart rate was in the 120s, temperature 98.7, blood pressure was initially 98/65 improved to 121/70 -Left foot x-rays without significant findings, chest x-ray without significant findings -ED labs revealed sodium of 131 with a glucose of 408 anion gap is 15, AST is 114 ALT is 85 -WBC is 10.6, lactic acid is not elevated -EDP gave Vanco  and Zosyn and requested hospitalization --EDP ordered blood cultures -Patient received tetanus booster --Please see photos of his infected left foot in epic     Review of systems:    In addition to the HPI above,   A full Review of  Systems was done, all other systems reviewed are negative except as noted above in HPI , .    Social History:  Reviewed by me    Social History   Tobacco Use   Smoking status: Never Smoker   Smokeless tobacco: Former NeurosurgeonUser    Types: Chew  Substance Use Topics   Alcohol use: Yes    Comment: occasionally       Family History :  Reviewed by me    Family History  Problem Relation Age of Onset   Cancer Mother        brain   Heart disease Father     Hyperlipidemia Father    Hypertension Father    Stroke Father     Home Medications:   Prior to Admission medications   Medication Sig Start Date End Date Taking? Authorizing Provider  atorvastatin (LIPITOR) 20 MG tablet Take 1 tablet (20 mg total) by mouth daily. 06/07/19  Yes Jacquelin HawkingMcElroy, Shannon, PA-C  lisinopril (ZESTRIL) 20 MG tablet TAKE 1 Tablet BY MOUTH ONCE DAILY FOR BLOOD PRESSURE 06/07/19  Yes Jacquelin HawkingMcElroy, Shannon, PA-C  metoprolol tartrate (LOPRESSOR) 50 MG tablet Take 1 tablet (50 mg total) by mouth 2 (two) times daily. 06/07/19  Yes Jacquelin HawkingMcElroy, Shannon, PA-C  sitaGLIPtin-metformin (JANUMET) 50-500 MG tablet Take 1 tablet by mouth 2 (two) times daily with a meal.   Yes [provider]     Allergies:     Allergies  Allergen Reactions   Pollen Extract Other (See Comments)    Runny nose, watery eyes, sneezing     Physical Exam:   Vitals  Blood pressure 121/76, pulse (!) 105, temperature 98.7 F (37.1 C), temperature source Oral, resp. rate 20, height 5\' 10"  (1.778 m), weight (!) 172.4 kg, SpO2 98 %.  Physical Examination: General appearance - alert, morbidly obese appearing, and in no distress Mental status - alert, oriented to person, place, and time,  Eyes - sclera anicteric Neck - supple, no JVD elevation , Chest - clear  to auscultation bilaterally, symmetrical air movement,  Heart - S1 and S2 normal, regular Abdomen - soft, nontender, nondistended, increased truncal adiposity  neurological - screening mental status exam normal, neck supple without rigidity, cranial nerves II through XII intact, DTR's normal and symmetric Extremities - no pedal edema noted, intact peripheral pulses  Skin - warm, dry MSK--left foot wound infection and cellulitis please see photos below---  -Media Information    Document Information  Photos    06/11/2019 13:17  Attached To:  Hospital Encounter on 06/11/19  Source Information  Petrucelli, Lucius ConnSamantha R, PA-C   [facility]   -- Media Information    Document Information  Photos    06/11/2019 13:17  Attached To:  Hospital Encounter on 06/11/19  Source Information  Cherly Andersonetrucelli, Samantha R, [place]PA-C   [facility]     Data Review:    CBC Recent Labs  Lab 06/11/19 1305  WBC 10.6*  HGB 15.0  HCT 46.1  PLT 189  MCV 90.4  MCH 29.4  MCHC 32.5  RDW 12.8  LYMPHSABS 1.2  MONOABS 0.7  EOSABS 0.1  BASOSABS 0.1   ------------------------------------------------------------------------------------------------------------------  Chemistries  Recent Labs  Lab 06/11/19 1305  NA 131*  K 3.8  CL 97*  CO2 19*  GLUCOSE 408*  BUN 12  CREATININE 0.70  CALCIUM 8.9  AST 114*  ALT 85*  ALKPHOS 74  BILITOT 1.4*   ------------------------------------------------------------------------------------------------------------------ estimated creatinine clearance is 190 mL/min (by C-G formula based on SCr of 0.7 mg/dL). ------------------------------------------------------------------------------------------------------------------ No results for input(s): TSH, T4TOTAL, T3FREE, THYROIDAB in the last 72 hours.  Invalid input(s): FREET3   Coagulation profile Recent Labs  Lab 06/11/19 1305  INR 1.1   ------------------------------------------------------------------------------------------------------------------- No results for input(s): DDIMER in the last 72 hours. -------------------------------------------------------------------------------------------------------------------  Cardiac Enzymes No results for input(s): CKMB, TROPONINI, MYOGLOBIN in the last 168 hours.  Invalid input(s): CK ------------------------------------------------------------------------------------------------------------------ No results found for: BNP   ---------------------------------------------------------------------------------------------------------------  Urinalysis    Component Value Date/Time     COLORURINE YELLOW 06/11/2019 1305   APPEARANCEUR HAZY (A) 06/11/2019 1305   LABSPEC 1.022 06/11/2019 1305   PHURINE 6.0 06/11/2019 1305   GLUCOSEU >=500 (A) 06/11/2019 1305   HGBUR NEGATIVE 06/11/2019 1305   BILIRUBINUR NEGATIVE 06/11/2019 1305   KETONESUR 20 (A) 06/11/2019 1305   PROTEINUR 100 (A) 06/11/2019 1305   UROBILINOGEN 0.2 08/31/2011 1727   NITRITE NEGATIVE 06/11/2019 1305   LEUKOCYTESUR SMALL (A) 06/11/2019 1305    ----------------------------------------------------------------------------------------------------------------   Imaging Results:    US Carotid Bilateral  Result Date: 06/11/2019 CLINICAL DATA:  43 year old male with a history of syncope EXAM: BILATERAL CAROTID DUPLEX ULTRASOUND TECHNIQUE: Pearline Cables scale imaging, color Doppler and duplex ultrasound were performed of bilateral carotid and vertebral arteries in the neck. COMPARISON:  None. FINDINGS: Criteria: Quantification of carotid stenosis is based on velocity parameters that correlate the residual internal carotid diameter with NASCET-based stenosis levels, using the diameter of the distal internal carotid lumen as the denominator for stenosis measurement. The following velocity measurements were obtained: RIGHT ICA:  Systolic 67 cm/sec, Diastolic 27 cm/sec CCA:  66 cm/sec SYSTOLIC ICA/CCA RATIO:  1.0 ECA:  61 cm/sec LEFT ICA:  Systolic 98 cm/sec, Diastolic 27 cm/sec CCA:  67 cm/sec SYSTOLIC ICA/CCA RATIO:  1.5 ECA:  70 cm/sec Right Brachial SBP: Not acquired Left Brachial SBP: Not acquired RIGHT CAROTID ARTERY: No significant calcified disease of the right common carotid artery. Intermediate waveform maintained. No significant plaque at the right carotid bifurcation. Low resistance waveform of the right ICA. No significant tortuosity. RIGHT VERTEBRAL ARTERY: Antegrade flow with low resistance waveform. LEFT CAROTID ARTERY: No significant calcified disease of the left common carotid artery. Intermediate waveform  maintained. No significant plaque at the right carotid bifurcation. Low resistance waveform of the left ICA. LEFT VERTEBRAL ARTERY:  Antegrade flow with low resistance waveform. IMPRESSION: Color duplex indicates no significant plaque, with no hemodynamically significant stenosis by duplex criteria in the extracranial cerebrovascular circulation. Signed, Dulcy Fanny. Dellia Nims, RPVI [facility] Electronically Signed   By: Corrie Mckusick D.O.   On: 06/11/2019 16:15   Dg Chest Port 1 View  Result Date: 06/11/2019 CLINICAL DATA:  43 year old male with possible sepsis. EXAM: PORTABLE CHEST 1 VIEW COMPARISON:  Chest radiograph dated 07/02/2013 FINDINGS: The lungs are clear. There is no pleural effusion or pneumothorax. Top-normal cardiac silhouette. No acute osseous pathology. IMPRESSION: No active disease. Electronically Signed   By: Anner Crete M.D.   On: 06/11/2019 13:51   Dg Foot Complete Left  Result Date: 06/11/2019 CLINICAL DATA:  Left foot redness, pain and swelling since the patient stepped on a tack 06/07/2019. Wound next to the little toe. Initial encounter. EXAM: LEFT FOOT -  COMPLETE 3+ VIEW COMPARISON:  None. FINDINGS: There is no evidence of fracture or dislocation. There is no evidence of arthropathy or other focal bone abnormality. Soft tissues are unremarkable. IMPRESSION: Negative exam. Electronically Signed   By: Drusilla Kannerhomas  Dalessio M.D.   On: 06/11/2019 13:51    Radiological Exams on Admission: [place] Carotid Bilateral  Result Date: 06/11/2019 CLINICAL DATA:  43 year old male with a history of syncope EXAM: BILATERAL CAROTID DUPLEX ULTRASOUND TECHNIQUE: Wallace CullensGray scale imaging, color Doppler and duplex ultrasound were performed of bilateral carotid and vertebral arteries in the neck. COMPARISON:  None. FINDINGS: Criteria: Quantification of carotid stenosis is based on velocity parameters that correlate the residual internal carotid diameter  with NASCET-based stenosis levels, using the diameter of the distal internal carotid lumen as the denominator for stenosis measurement. The following velocity measurements were obtained: RIGHT ICA:  Systolic 67 cm/sec, Diastolic 27 cm/sec CCA:  66 cm/sec SYSTOLIC ICA/CCA RATIO:  1.0 ECA:  61 cm/sec LEFT ICA:  Systolic 98 cm/sec, Diastolic 27 cm/sec CCA:  67 cm/sec SYSTOLIC ICA/CCA RATIO:  1.5 ECA:  70 cm/sec Right Brachial SBP: Not acquired Left Brachial SBP: Not acquired RIGHT CAROTID ARTERY: No significant calcified disease of the right common carotid artery. Intermediate waveform maintained. No significant plaque at the right carotid bifurcation. Low resistance waveform of the right ICA. No significant tortuosity. RIGHT VERTEBRAL ARTERY: Antegrade flow with low resistance waveform. LEFT CAROTID ARTERY: No significant calcified disease of the left common carotid artery. Intermediate waveform maintained. No significant plaque at the right carotid bifurcation. Low resistance waveform of the left ICA. LEFT VERTEBRAL ARTERY:  Antegrade flow with low resistance waveform. IMPRESSION: Color duplex indicates no significant plaque, with no hemodynamically significant stenosis by duplex criteria in the extracranial cerebrovascular circulation. Signed, Yvone NeuJaime S. Reyne DumasWagner, DO, RPVI [facility] Electronically Signed   By: Gilmer MorJaime  Wagner D.O.   On: 06/11/2019 16:15   Dg Chest Port 1 View  Result Date: 06/11/2019 CLINICAL DATA:  43 year old male with possible sepsis. EXAM: PORTABLE CHEST 1 VIEW COMPARISON:  Chest radiograph dated 07/02/2013 FINDINGS: The lungs are clear. There is no pleural effusion or pneumothorax. Top-normal cardiac silhouette. No acute osseous pathology. IMPRESSION: No active disease. Electronically Signed   By: Elgie CollardArash  Radparvar M.D.   On: 06/11/2019 13:51   Dg Foot Complete Left  Result Date: 06/11/2019 CLINICAL DATA:  Left foot redness, pain and  swelling since the patient stepped on a tack 06/07/2019. Wound next to the little toe. Initial encounter. EXAM: LEFT FOOT - COMPLETE 3+ VIEW COMPARISON:  None. FINDINGS: There is no evidence of fracture or dislocation. There is no evidence of arthropathy or other focal bone abnormality. Soft tissues are unremarkable. IMPRESSION: Negative exam. Electronically Signed   By: Drusilla Kannerhomas  Dalessio M.D.   On: 06/11/2019 13:51    DVT Prophylaxis -SCD /Heaprin AM Labs Ordered, also please review Full Orders  Family Communication: Admission, patients condition and plan of care including tests being ordered have been discussed with the patient  who indicate understanding and agree with the plan   Code Status - Full Code  Likely DC to  home  Condition   stable  Shon Haleourage Macrina Lehnert M.D on 06/11/2019 at 7:17 PM Go to [email or web address] -  for contact info  [facility] - Office  [phone]

## 2019-06-11 NOTE — Progress Notes (Signed)
Pharmacy Antibiotic Note  BRYSIN TOWERY is a 43 y.o. male admitted on 06/11/2019 with wound infection.  Pharmacy has been consulted for Zosyn dosing.  Plan: Zosyn 3.375g IV q8h (4 hour infusion).  Monitor labs, c/s, and patient improvement.  Height: 5\' 10"  (177.8 cm) Weight: (!) 380 lb (172.4 kg) IBW/kg (Calculated) : 73  Temp (24hrs), Avg:98.4 F (36.9 C), Min:98.3 F (36.8 C), Max:98.5 F (36.9 C)  Recent Labs  Lab 06/11/19 1305 06/11/19 1455  WBC 10.6*  --   CREATININE 0.70  --   LATICACIDVEN 1.9 1.7    Estimated Creatinine Clearance: 190 mL/min (by C-G formula based on SCr of 0.7 mg/dL).    Allergies  Allergen Reactions  . Pollen Extract Other (See Comments)    Runny nose, watery eyes, sneezing    Antimicrobials this admission: Zosyn 9/14 >>  Vanco x 1 9/14  Dose adjustments this admission: N/A  Microbiology results: 9/14 BCx: pending  9/14 MRSA PCR: pending  Thank you for allowing pharmacy to be a part of this patient's care.  Ramond Craver 06/11/2019 3:39 PM

## 2019-06-11 NOTE — ED Provider Notes (Addendum)
[facility] EMERGENCY DEPARTMENT Provider Note   CSN: [id number] Arrival date & time: 06/11/19  1254     History   Chief Complaint Chief Complaint  Patient presents with  . Wound Infection    HPI Worthy FlankKenneth R Long is a 43 y.o. male with a hx of anxiety, depression, T2DM, HTN, hypercholesterolemia, & obesity who presents to the ED with complaints of L foot infection & s/p near syncope episode this AM shortly PTA. Patient states about 1 week ago he was walking in his kitchen when he accidentally stepped on a tac which went through his sneaker into his L foot. Reports he fully removed to the tac and did not think much of this. Since then he has had progressive wound & redness to the L foot that is painful when he walks, but not overly severe. He states this AM he returned from the grocery store, he was walking from the kitchen to the restroom when he started to feel lightheaded and sweaty as if he might pass out.  He states that he walked towards his bed and somewhat fell onto it but did not lose consciousness.  He states that his family called 911.  He is not feeling lightheaded/20 currently.  He denies any chest pain or shortness of breath throughout this event.  No history of similar.  He had a Diet Coke with a Percocet this morning and did not take his morning meds.  Denies fever, chills, vomiting, chest pain, dyspnea, numbness, tingling, focal weakness, or abdominal pain.  Unknown last tetanus.     HPI  Past Medical History:  Diagnosis Date  . Anxiety   . Cholelithiasis   . Depression   . Diabetes mellitus   . Hypercholesteremia   . Hypertension   . Morbid obesity [facility](HCC)     Patient Active Problem List   Diagnosis Date Noted  . Personal history of noncompliance with medical treatment, presenting hazards to health 03/16/2018  . Uncontrolled type 2 diabetes mellitus with complication (HCC) 10/12/2015  . Mixed hyperlipidemia 10/12/2015  . Morbid obesity (HCC) 10/12/2015  .  Essential hypertension, benign 10/12/2015  . Chest pain 07/02/2013  . HTN (hypertension) 07/02/2013  . DM (diabetes mellitus) (HCC) 07/02/2013    History reviewed. No pertinent surgical history.      Home Medications    Prior to Admission medications   Medication Sig Start Date End Date Taking? Authorizing Provider  atorvastatin (LIPITOR) 20 MG tablet Take 1 tablet (20 mg total) by mouth daily. 06/07/19   Jacquelin HawkingMcElroy, Shannon, PA-C  lisinopril (ZESTRIL) 20 MG tablet TAKE 1 Tablet BY MOUTH ONCE DAILY FOR BLOOD PRESSURE 06/07/19   Jacquelin HawkingMcElroy, Shannon, PA-C  metoprolol tartrate (LOPRESSOR) 50 MG tablet Take 1 tablet (50 mg total) by mouth 2 (two) times daily. 06/07/19   Jacquelin HawkingMcElroy, Shannon, PA-C  sitaGLIPtin-metformin (JANUMET) 50-500 MG tablet Take 1 tablet by mouth 2 (two) times daily with a meal.    [provider]    Family History Family History  Problem Relation Age of Onset  . Cancer Mother        brain  . Heart disease Father   . Hyperlipidemia Father   . Hypertension Father   . Stroke Father     Social History Social History   Tobacco Use  . Smoking status: Never Smoker  . Smokeless tobacco: Former NeurosurgeonUser    Types: Chew  Substance Use Topics  . Alcohol use: Yes    Comment: occasionally  . Drug use: No  Allergies   Pollen extract   Review of Systems Review of Systems  Constitutional: Positive for diaphoresis (Resolved at present.). Negative for chills and fever.  Respiratory: Negative for shortness of breath.   Cardiovascular: Negative for chest pain.  Gastrointestinal: Negative for abdominal pain and vomiting.  Musculoskeletal: Positive for arthralgias.  Skin: Positive for color change and wound.  Neurological: Negative for weakness, numbness and headaches.       Positive for near syncope.  All other systems reviewed and are negative.    Physical Exam Updated Vital Signs BP 122/74 (BP Location: Left Arm)   Pulse 92   Temp 98.4 F (36.9 C) (Oral)    Resp 20   Ht 5\' 11"  (1.803 m)   Wt (!) 171.4 kg   SpO2 98%   BMI 52.70 kg/m   Physical Exam Vitals signs and nursing note reviewed.  Constitutional:      General: He is not in acute distress.    Appearance: He is well-developed. He is obese. He is not ill-appearing or toxic-appearing.  HENT:     Head: Normocephalic and atraumatic.  Eyes:     General:        Right eye: No discharge.        Left eye: No discharge.     Conjunctiva/sclera: Conjunctivae normal.  Neck:     Musculoskeletal: Neck supple.  Cardiovascular:     Rate and Rhythm: Regular rhythm. Tachycardia present.     Pulses:          Dorsalis pedis pulses are 2+ on the right side and 2+ on the left side.       Posterior tibial pulses are 2+ on the right side and 2+ on the left side.  Pulmonary:     Effort: Pulmonary effort is normal. No respiratory distress.     Breath sounds: Normal breath sounds. No wheezing, rhonchi or rales.  Abdominal:     General: There is no distension.     Palpations: Abdomen is soft.     Tenderness: There is no abdominal tenderness.  Musculoskeletal:     Comments: Lower extremities: See photos below for details.  There is 5 mm ulcerative type wound to the plantar surface of the left foot just proximal to the fourth digit.  Patient's dorsal left fourth toe has soft tissue sloughing, is erythematous, and warm to the touch.  Erythema extends to the dorsum of the foot and mild to the lower leg.  The area is hot to touch.  He has intact active range of motion throughout his lower extremities including his digits.  He has decent cap refill to all digits including the left fourth toe.  He has some tenderness to palpation to the left fourth phalanges.  Lower extremities are otherwise nontender.  Skin:    General: Skin is warm and dry.     Capillary Refill: Capillary refill takes less than 2 seconds.     Findings: No rash.  Neurological:     General: No focal deficit present.     Mental Status: He  is alert.     Comments: Alert. Clear speech. Sensation grossly intact to bilateral lower extremities. 5/5 strength with plantar/dorsiflexion bilaterally.   Psychiatric:        Mood and Affect: Mood normal.        Behavior: Behavior normal.            ED Treatments / Results  Labs (all labs ordered are listed, but only abnormal results  are displayed) Labs Reviewed  COMPREHENSIVE METABOLIC PANEL - Abnormal; Notable for the following components:      Result Value   Sodium 131 (*)    Chloride 97 (*)    CO2 19 (*)    Glucose, Bld 408 (*)    Albumin 3.4 (*)    AST 114 (*)    ALT 85 (*)    Total Bilirubin 1.4 (*)    All other components within normal limits  CBC WITH DIFFERENTIAL/PLATELET - Abnormal; Notable for the following components:   WBC 10.6 (*)    Neutro Abs 8.4 (*)    Abs Immature Granulocytes 0.08 (*)    All other components within normal limits  CULTURE, BLOOD (ROUTINE X 2)  CULTURE, BLOOD (ROUTINE X 2)  SARS CORONAVIRUS 2 (HOSPITAL ORDER, PERFORMED IN [facility])  LACTIC ACID, PLASMA  PROTIME-INR  LACTIC ACID, PLASMA  URINALYSIS, ROUTINE W REFLEX MICROSCOPIC    EKG EKG Interpretation  Date/Time:  Monday June 11 2019 13:03:16 EDT Ventricular Rate:  122 PR Interval:    QRS Duration: 96 QT Interval:  330 QTC Calculation: 471 R Axis:   89 Text Interpretation:  Sinus tachycardia Borderline repolarization abnormality Confirmed by Bethann BerkshireZammit, Joseph [phone]([id number]) on 06/11/2019 2:30:40 PM   Radiology Dg Chest Port 1 View  Result Date: 06/11/2019 CLINICAL DATA:  43 year old male with possible sepsis. EXAM: PORTABLE CHEST 1 VIEW COMPARISON:  Chest radiograph dated 07/02/2013 FINDINGS: The lungs are clear. There is no pleural effusion or pneumothorax. Top-normal cardiac silhouette. No acute osseous pathology. IMPRESSION: No active disease. Electronically Signed   By: Elgie CollardArash  Radparvar M.D.   On: 06/11/2019 13:51   Dg Foot Complete Left  Result Date:  06/11/2019 CLINICAL DATA:  Left foot redness, pain and swelling since the patient stepped on a tack 06/07/2019. Wound next to the little toe. Initial encounter. EXAM: LEFT FOOT - COMPLETE 3+ VIEW COMPARISON:  None. FINDINGS: There is no evidence of fracture or dislocation. There is no evidence of arthropathy or other focal bone abnormality. Soft tissues are unremarkable. IMPRESSION: Negative exam. Electronically Signed   By: Drusilla Kannerhomas  Dalessio M.D.   On: 06/11/2019 13:51    Procedures .Critical Care Performed by: Cherly AndersonPetrucelli, Jaysten Essner R, PA-C Authorized by: Cherly AndersonPetrucelli, Ashly Goethe R, PA-C    CRITICAL CARE Performed by: Harvie HeckSamantha Erikka Follmer   Total critical care time: 30 minutes  Critical care time was exclusive of separately billable procedures and treating other patients.  Critical care was necessary to treat or prevent imminent or life-threatening deterioration.  Critical care was time spent personally by me on the following activities: development of treatment plan with patient and/or surrogate as well as nursing, discussions with consultants, evaluation of patient's response to treatment, examination of patient, obtaining history from patient or surrogate, ordering and performing treatments and interventions, ordering and review of laboratory studies, ordering and review of radiographic studies, pulse oximetry and re-evaluation of patient's condition.  (including critical care time)  Medications Ordered in ED Medications - No data to display   Initial Impression / Assessment and Plan / ED Course  I have reviewed the triage vital signs and the nursing notes.  Pertinent labs & imaging results that were available during my care of the patient were reviewed by me and considered in my medical decision making (see chart for details).   Patient presents to the emergency department status post near syncope episode this morning also with left foot infection.  He is nontoxic-appearing, resting  comfortably, notably tachycardic with  borderline soft pressures.  His left foot has obvious cellulitis likely from a wound infection when he stepped on a tac 1 week prior.  Code sepsis initiated w/ broad spectrum abx & 30 cc/kg bolus. Suspect syncope secondary to soft pressure, but unclear definitive etiology, on cardiac monitor.   Work-up reviewed: CBC: Mild leukocytosis at 10.6 w/ left shift.  CMP: Mild electrolyte abnormalities as above, LFTs increased from prior, renal function preserved.  Patient is hyperglycemic with bicarb of 19, anion gap within normal limits. Lactic acid: WNL CXR: Negative L foot x-ray: No obvious signs of osteomyelitis COVID; Pending EKG: sinus tachycardia.     02:40: RE-EVAL: Resting comfortably. HR 113, BP 113/70 Will consult for admission for L foot celluitis.   15:03: CONSULT: Discussed w/ hospitalist Dr. Roxan Hockey- accepts admission  Findings and plan of care discussed with supervising physician Dr. Roderic Palau who is in agreement.   Adam Long was evaluated in Emergency Department on 06/11/2019 for the symptoms described in the history of present illness. He/she was evaluated in the context of the global COVID-19 pandemic, which necessitated consideration that the patient might be at risk for infection with the SARS-CoV-2 virus that causes COVID-19. Institutional protocols and algorithms that pertain to the evaluation of patients at risk for COVID-19 are in a state of rapid change based on information released by regulatory bodies including the CDC and federal and state organizations. These policies and algorithms were followed during the patient's care in the ED.   Final Clinical Impressions(s) / ED Diagnoses   Final diagnoses:  Cellulitis of left lower extremity  Near syncope    ED Discharge Orders    None       Amaryllis Dyke, PA-C 06/11/19 1512  Addendum to include vitals.     Leafy Kindle 06/12/19 1734     Milton Ferguson, MD 06/14/19 1201

## 2019-06-12 ENCOUNTER — Inpatient Hospital Stay (HOSPITAL_COMMUNITY): Payer: Self-pay

## 2019-06-12 DIAGNOSIS — I350 Nonrheumatic aortic (valve) stenosis: Secondary | ICD-10-CM

## 2019-06-12 DIAGNOSIS — L03032 Cellulitis of left toe: Secondary | ICD-10-CM

## 2019-06-12 LAB — CBC
HCT: 43.4 % (ref 39.0–52.0)
Hemoglobin: 14.1 g/dL (ref 13.0–17.0)
MCH: 29.8 pg (ref 26.0–34.0)
MCHC: 32.5 g/dL (ref 30.0–36.0)
MCV: 91.8 fL (ref 80.0–100.0)
Platelets: 201 10*3/uL (ref 150–400)
RBC: 4.73 MIL/uL (ref 4.22–5.81)
RDW: 12.9 % (ref 11.5–15.5)
WBC: 8.7 10*3/uL (ref 4.0–10.5)
nRBC: 0 % (ref 0.0–0.2)

## 2019-06-12 LAB — BASIC METABOLIC PANEL
Anion gap: 9 (ref 5–15)
BUN: 11 mg/dL (ref 6–20)
CO2: 25 mmol/L (ref 22–32)
Calcium: 8.6 mg/dL — ABNORMAL LOW (ref 8.9–10.3)
Chloride: 101 mmol/L (ref 98–111)
Creatinine, Ser: 0.67 mg/dL (ref 0.61–1.24)
GFR calc Af Amer: >60 mL/min (ref >60)
GFR calc non Af Amer: >60 mL/min (ref >60)
Glucose, Bld: 299 mg/dL — ABNORMAL HIGH (ref 70–99)
Potassium: 3.9 mmol/L (ref 3.5–5.1)
Sodium: 135 mmol/L (ref 135–145)

## 2019-06-12 LAB — ECHOCARDIOGRAM COMPLETE
Height: 71 in
Weight: 6045.9 oz

## 2019-06-12 LAB — GLUCOSE, CAPILLARY
Glucose-Capillary: 321 mg/dL — ABNORMAL HIGH (ref 70–99)
Glucose-Capillary: 389 mg/dL — ABNORMAL HIGH (ref 70–99)
Glucose-Capillary: 288 mg/dL — ABNORMAL HIGH (ref 70–99)
Glucose-Capillary: 281 mg/dL — ABNORMAL HIGH (ref 70–99)

## 2019-06-12 LAB — MRSA PCR SCREENING: MRSA by PCR: NEGATIVE

## 2019-06-12 LAB — HIV ANTIBODY (ROUTINE TESTING W REFLEX): HIV Screen 4th Generation wRfx: NONREACTIVE

## 2019-06-12 NOTE — Consult Note (Signed)
Reason for Consult: Cellulitis of left fourth toe, foot Referring Physician: Dr. Jarold MottoEmokpae  Adam Long is an 43 y.o. male.  HPI: Patient is a 43 year old white male with uncontrolled diabetes mellitus and morbid obesity who stepped on attack several days ago.  He presented to the emergency room with worsening swelling and drainage from his left fourth toe.  He was admitted to the hospital for IV antibiotics.  He states he does have sensation in the left foot and toes.  He has had no surgeries on his feet.  He currently denies any significant pain in the left foot.  Past Medical History:  Diagnosis Date  . Anxiety   . Cholelithiasis   . Depression   . Diabetes mellitus   . Hypercholesteremia   . Hypertension   . Morbid obesity (HCC)     History reviewed. No pertinent surgical history.  Family History  Problem Relation Age of Onset  . Cancer Mother        brain  . Heart disease Father   . Hyperlipidemia Father   . Hypertension Father   . Stroke Father     Social History:  reports that he has never smoked. He has quit using smokeless tobacco.  His smokeless tobacco use included chew. He reports current alcohol use. He reports that he does not use drugs.  Allergies:  Allergies  Allergen Reactions  . Pollen Extract Other (See Comments)    Runny nose, watery eyes, sneezing    Medications: I have reviewed the patient's current medications.  Results for orders placed or performed during the hospital encounter of 06/11/19 (from the past 48 hour(s))  Comprehensive metabolic panel     Status: Abnormal   Collection Time: 06/11/19  1:05 PM  Result Value Ref Range   Sodium 131 (L) 135 - 145 mmol/L   Potassium 3.8 3.5 - 5.1 mmol/L   Chloride 97 (L) 98 - 111 mmol/L   CO2 19 (L) 22 - 32 mmol/L   Glucose, Bld 408 (H) 70 - 99 mg/dL   BUN 12 6 - 20 mg/dL   Creatinine, Ser 9.560.70 0.61 - 1.24 mg/dL   Calcium 8.9 8.9 - 21.310.3 mg/dL   Total Protein 7.1 6.5 - 8.1 g/dL   Albumin 3.4 (L) 3.5  - 5.0 g/dL   AST 086114 (H) 15 - 41 U/L   ALT 85 (H) 0 - 44 U/L   Alkaline Phosphatase 74 38 - 126 U/L   Total Bilirubin 1.4 (H) 0.3 - 1.2 mg/dL   GFR calc non Af Amer >60 >60 mL/min   GFR calc Af Amer >60 >60 mL/min   Anion gap 15 5 - 15    Comment: Performed at [facility], [address] [place], [place] [address]  Lactic acid, plasma     Status: None   Collection Time: 06/11/19  1:05 PM  Result Value Ref Range   Lactic Acid, Venous 1.9 0.5 - 1.9 mmol/L    Comment: Performed at [facility], [address] [place], [place] [address]  CBC with Differential     Status: Abnormal   Collection Time: 06/11/19  1:05 PM  Result Value Ref Range   WBC 10.6 (H) 4.0 - 10.5 K/uL   RBC 5.10 4.22 - 5.81 MIL/uL   Hemoglobin 15.0 13.0 - 17.0 g/dL   HCT 28.446.1 13.239.0 - 44.052.0 %   MCV 90.4 80.0 - 100.0 fL   MCH 29.4 26.0 - 34.0 pg   MCHC 32.5 30.0 - 36.0  g/dL   RDW 45.8 59.2 - 92.4 %   Platelets 189 150 - 400 K/uL   nRBC 0.0 0.0 - 0.2 %   Neutrophils Relative % 78 %   Neutro Abs 8.4 (H) 1.7 - 7.7 K/uL   Lymphocytes Relative 12 %   Lymphs Abs 1.2 0.7 - 4.0 K/uL   Monocytes Relative 7 %   Monocytes Absolute 0.7 0.1 - 1.0 K/uL   Eosinophils Relative 1 %   Eosinophils Absolute 0.1 0.0 - 0.5 K/uL   Basophils Relative 1 %   Basophils Absolute 0.1 0.0 - 0.1 K/uL   Immature Granulocytes 1 %   Abs Immature Granulocytes 0.08 (H) 0.00 - 0.07 K/uL    Comment: Performed at [facility], [address], [place], [place] [address]  Protime-INR     Status: None   Collection Time: 06/11/19  1:05 PM  Result Value Ref Range   Prothrombin Time 13.8 11.4 - 15.2 seconds   INR 1.1 0.8 - 1.2    Comment: (NOTE) INR goal varies based on device and disease states. Performed at [facility], [address], [place], [place] [address]   Culture, blood (Routine x 2)     Status: None (Preliminary result)   Collection Time: 06/11/19  1:05 PM   Specimen: BLOOD RIGHT FOREARM  Result Value Ref Range   Specimen Description       BLOOD RIGHT FOREARM BOTTLES DRAWN AEROBIC AND ANAEROBIC   Special Requests Blood Culture adequate volume    Culture      NO GROWTH < 24 HOURS Performed at [facility], [address], [place], [place] [address]    Report Status PENDING   Urinalysis, Routine w reflex microscopic     Status: Abnormal   Collection Time: 06/11/19  1:05 PM  Result Value Ref Range   Color, Urine YELLOW YELLOW   APPearance HAZY (A) CLEAR   Specific Gravity, Urine 1.022 1.005 - 1.030   pH 6.0 5.0 - 8.0   Glucose, UA >=500 (A) NEGATIVE mg/dL   Hgb urine dipstick NEGATIVE NEGATIVE   Bilirubin Urine NEGATIVE NEGATIVE   Ketones, ur 20 (A) NEGATIVE mg/dL   Protein, ur 903 (A) NEGATIVE mg/dL   Nitrite NEGATIVE NEGATIVE   Leukocytes,Ua SMALL (A) NEGATIVE   RBC / HPF 6-10 0 - 5 RBC/hpf   WBC, UA 21-50 0 - 5 WBC/hpf   Bacteria, UA RARE (A) NONE SEEN   Squamous Epithelial / LPF 0-5 0 - 5    Comment: Performed at [facility], [address], [place], [place] [address]  Culture, blood (Routine x 2)     Status: None (Preliminary result)   Collection Time: 06/11/19  1:10 PM   Specimen: Right Antecubital; Blood  Result Value Ref Range   Specimen Description      RIGHT ANTECUBITAL BOTTLES DRAWN AEROBIC AND ANAEROBIC   Special Requests Blood Culture adequate volume    Culture      NO GROWTH < 24 HOURS Performed at [facility], [address], [name], [place] [address]    Report Status PENDING   SARS Coronavirus 2 [facility] order, Performed in [facility] hospital lab) Nasopharyngeal Urine, Clean Catch     Status: None   Collection Time: 06/11/19  2:30 PM   Specimen: Urine, Clean Catch; Nasopharyngeal  Result Value Ref Range   SARS Coronavirus 2 NEGATIVE NEGATIVE    Comment: (NOTE) If result is NEGATIVE SARS-CoV-2 target nucleic acids are NOT DETECTED. The SARS-CoV-2 RNA is generally  detectable in upper and lower  respiratory specimens during the acute phase of infection. The lowest  concentration of  SARS-CoV-2 viral copies this assay can detect is 250  copies / mL. A negative result does not preclude SARS-CoV-2 infection  and should not be used as the sole basis for treatment or other  patient management decisions.  A negative result may occur with  improper specimen collection / handling, submission of specimen other  than nasopharyngeal swab, presence of viral mutation(s) within the  areas targeted by this assay, and inadequate number of viral copies  (<250 copies / mL). A negative result must be combined with clinical  observations, patient history, and epidemiological information. If result is POSITIVE SARS-CoV-2 target nucleic acids are DETECTED. The SARS-CoV-2 RNA is generally detectable in upper and lower  respiratory specimens dur ing the acute phase of infection.  Positive  results are indicative of active infection with SARS-CoV-2.  Clinical  correlation with patient history and other diagnostic information is  necessary to determine patient infection status.  Positive results do  not rule out bacterial infection or co-infection with other viruses. If result is PRESUMPTIVE POSTIVE SARS-CoV-2 nucleic acids MAY BE PRESENT.   A presumptive positive result was obtained on the submitted specimen  and confirmed on repeat testing.  While 2019 novel coronavirus  (SARS-CoV-2) nucleic acids may be present in the submitted sample  additional confirmatory testing may be necessary for epidemiological  and / or clinical management purposes  to differentiate between  SARS-CoV-2 and other Sarbecovirus currently known to infect humans.  If clinically indicated additional testing with an alternate test  methodology [id number]) is advised. The SARS-CoV-2 RNA is generally  detectable in upper and lower respiratory sp ecimens during the acute  phase of infection. The expected result is Negative. Fact Sheet for Patients:  BoilerBrush.com.cy Fact Sheet for Healthcare  Providers: https://pope.com/ This test is not yet approved or cleared by the [place] FDA and has been authorized for detection and/or diagnosis of SARS-CoV-2 by FDA under an Emergency Use Authorization (EUA).  This EUA will remain in effect (meaning this test can be used) for the duration of the COVID-19 declaration under Section 564(b)(1) of the Act, 21 U.S.C. section 360bbb-3(b)(1), unless the authorization is terminated or revoked sooner. Performed at [facility], [address] [place], [place] [address]   Lactic acid, plasma     Status: None   Collection Time: 06/11/19  2:55 PM  Result Value Ref Range   Lactic Acid, Venous 1.7 0.5 - 1.9 mmol/L    Comment: Performed at [facility], [address], [place], [place] [address]  HIV antibody (Routine Testing)     Status: None   Collection Time: 06/11/19  2:55 PM  Result Value Ref Range   HIV Screen 4th Generation wRfx Non Reactive Non Reactive    Comment: (NOTE) Performed At: [facility] [address] [place], [place] [id number] Jolene Schimke MD HY:[id number]   Glucose, capillary     Status: Abnormal   Collection Time: 06/11/19  5:11 PM  Result Value Ref Range   Glucose-Capillary 355 (H) 70 - 99 mg/dL   Comment 1 Notify RN    Comment 2 Document in Chart   Glucose, capillary     Status: Abnormal   Collection Time: 06/11/19  9:43 PM  Result Value Ref Range   Glucose-Capillary 351 (H) 70 - 99 mg/dL  MRSA PCR Screening     Status: None   Collection Time: 06/11/19 10:19  PM   Specimen: Nasal Mucosa; Nasopharyngeal  Result Value Ref Range   MRSA by PCR NEGATIVE NEGATIVE    Comment:        The GeneXpert MRSA Assay (FDA approved for NASAL specimens only), is one component of a comprehensive MRSA colonization surveillance program. It is not intended to diagnose MRSA infection nor to guide or monitor treatment for MRSA infections. Performed at [facility], [address]  [place], [place] [address]   Basic metabolic panel     Status: Abnormal   Collection Time: 06/12/19  4:43 AM  Result Value Ref Range   Sodium 135 135 - 145 mmol/L   Potassium 3.9 3.5 - 5.1 mmol/L   Chloride 101 98 - 111 mmol/L   CO2 25 22 - 32 mmol/L   Glucose, Bld 299 (H) 70 - 99 mg/dL   BUN 11 6 - 20 mg/dL   Creatinine, Ser 6.040.67 0.61 - 1.24 mg/dL   Calcium 8.6 (L) 8.9 - 10.3 mg/dL   GFR calc non Af Amer >60 >60 mL/min   GFR calc Af Amer >60 >60 mL/min   Anion gap 9 5 - 15    Comment: Performed at [facility], [address] [place], [place] [address]  CBC     Status: None   Collection Time: 06/12/19  4:43 AM  Result Value Ref Range   WBC 8.7 4.0 - 10.5 K/uL   RBC 4.73 4.22 - 5.81 MIL/uL   Hemoglobin 14.1 13.0 - 17.0 g/dL   HCT 11.943.4 14.739.0 - 82.952.0 %   MCV 91.8 80.0 - 100.0 fL   MCH 29.8 26.0 - 34.0 pg   MCHC 32.5 30.0 - 36.0 g/dL   RDW 56.212.9 13.011.5 - 86.515.5 %   Platelets 201 150 - 400 K/uL   nRBC 0.0 0.0 - 0.2 %    Comment: Performed at [facility], [address] [place], [place] [address]  Glucose, capillary     Status: Abnormal   Collection Time: 06/12/19  8:00 AM  Result Value Ref Range   Glucose-Capillary 321 (H) 70 - 99 mg/dL    [place] Carotid Bilateral  Result Date: 06/11/2019 CLINICAL DATA:  43 year old male with a history of syncope EXAM: BILATERAL CAROTID DUPLEX ULTRASOUND TECHNIQUE: Wallace CullensGray scale imaging, color Doppler and duplex ultrasound were performed of bilateral carotid and vertebral arteries in the neck. COMPARISON:  None. FINDINGS: Criteria: Quantification of carotid stenosis is based on velocity parameters that correlate the residual internal carotid diameter with NASCET-based stenosis levels, using the diameter of the distal internal carotid lumen as the denominator for stenosis measurement. The following velocity measurements were obtained: RIGHT ICA:  Systolic 67 cm/sec, Diastolic 27 cm/sec CCA:  66 cm/sec SYSTOLIC ICA/CCA RATIO:  1.0 ECA:  61 cm/sec LEFT ICA:  Systolic 98  cm/sec, Diastolic 27 cm/sec CCA:  67 cm/sec SYSTOLIC ICA/CCA RATIO:  1.5 ECA:  70 cm/sec Right Brachial SBP: Not acquired Left Brachial SBP: Not acquired RIGHT CAROTID ARTERY: No significant calcified disease of the right common carotid artery. Intermediate waveform maintained. No significant plaque at the right carotid bifurcation. Low resistance waveform of the right ICA. No significant tortuosity. RIGHT VERTEBRAL ARTERY: Antegrade flow with low resistance waveform. LEFT CAROTID ARTERY: No significant calcified disease of the left common carotid artery. Intermediate waveform maintained. No significant plaque at the right carotid bifurcation. Low resistance waveform of the left ICA. LEFT VERTEBRAL ARTERY:  Antegrade flow with low resistance waveform. IMPRESSION: Color duplex indicates no significant plaque, with no hemodynamically  significant stenosis by duplex criteria in the extracranial cerebrovascular circulation. Signed, Dulcy Fanny. Dellia Nims, RPVI [facility] Electronically Signed   By: Corrie Mckusick D.O.   On: 06/11/2019 16:15   Dg Chest Port 1 View  Result Date: 06/11/2019 CLINICAL DATA:  43 year old male with possible sepsis. EXAM: PORTABLE CHEST 1 VIEW COMPARISON:  Chest radiograph dated 07/02/2013 FINDINGS: The lungs are clear. There is no pleural effusion or pneumothorax. Top-normal cardiac silhouette. No acute osseous pathology. IMPRESSION: No active disease. Electronically Signed   By: Anner Crete M.D.   On: 06/11/2019 13:51   Dg Foot Complete Left  Result Date: 06/11/2019 CLINICAL DATA:  Left foot redness, pain and swelling since the patient stepped on a tack 06/07/2019. Wound next to the little toe. Initial encounter. EXAM: LEFT FOOT - COMPLETE 3+ VIEW COMPARISON:  None. FINDINGS: There is no evidence of fracture or dislocation. There is no evidence of arthropathy or other focal bone abnormality. Soft tissues are unremarkable.  IMPRESSION: Negative exam. Electronically Signed   By: Inge Rise M.D.   On: 06/11/2019 13:51    ROS:  Pertinent items are noted in HPI.  Blood pressure 123/69, pulse 91, temperature 98.6 F (37 C), temperature source Oral, resp. rate (!) 21, height 5\' 11"  (1.803 m), weight (!) 171.4 kg, SpO2 98 %. Physical Exam: Morbidly obese white male no acute distress Head is normocephalic, atraumatic Lungs clear to auscultation with equal breath sounds bilaterally Heart examination reveals regular rate and rhythm without S3, S4, murmurs Extremity examination reveals palpable femoral pulses.  The left foot is swollen and erythematous up to the ankle.  The fourth toe is cyanotic with superficial skin loss.  A chronic-looking ulceration is noted along the plantar surface of the left metatarsal head.  He does have sensation in the left toes.  Due to body habitus, I could not palpate a dorsalis pedis or posterior tibial pulse.  Assessment/Plan: Impression: Cellulitis of left fourth toe, foot.  Uncontrolled diabetes mellitus Plan: Agree with continuing IV antibiotics.  We will get segmental arterial Dopplers to further assess the vasculature.  I did talk to the patient about the possible need for toe amputation in the future.  Aviva Signs 06/12/2019, 9:29 AM

## 2019-06-12 NOTE — Progress Notes (Signed)
Inpatient Diabetes Program Recommendations  AACE/ADA: New Consensus Statement on Inpatient Glycemic Control   Target Ranges:  Prepandial:   less than 140 mg/dL      Peak postprandial:   less than 180 mg/dL (1-2 hours)      Critically ill patients:  140 - 180 mg/dL   Results for LUISDAVID, HAMBLIN (MRN [id number]) as of 06/12/2019 09:44  Ref. Range 07/03/2013 07:29 07/03/2013 11:39 06/11/2019 17:11 06/11/2019 21:43 06/12/2019 08:00  Glucose-Capillary Latest Ref Range: 70 - 99 mg/dL 281 (H) 330 (H) 355 (H) 351 (H) 321 (H)   Review of Glycemic Control  Diabetes history: DM2 Outpatient Diabetes medications: Janumet 50-500 mg BID Current orders for Inpatient glycemic control: Metformin 500 mg BID, Tradjenta 5 mg daily, Novolog 0-9 units TID with meals, Novolog 0-5 units QHS  Inpatient Diabetes Program Recommendations:   Insulin-Basal: Please consider ordering Lantus 15 units Q24H (starting now).  A1C: Current A1C in process.  Thanks, Barnie Alderman, RN, MSN, CDE Diabetes Coordinator Inpatient Diabetes Program [phone] (Team Pager from 8am to 5pm)

## 2019-06-12 NOTE — Progress Notes (Signed)
*  PRELIMINARY RESULTS* Echocardiogram 2D Echocardiogram has been performed with Definity.  Samuel Germany 06/12/2019, 1:13 PM

## 2019-06-12 NOTE — Progress Notes (Addendum)
Patient Demographics:    Adam Long, is a 43 y.o. male, DOB - 07-Feb-1976, EPP:[id number]  Admit date - 06/11/2019   Admitting Physician Brydon Spahr Denton Brick, MD  Outpatient Primary MD for the patient is Soyla Dryer, PA-C  LOS - 1   Chief Complaint  Patient presents with   Wound Infection        Subjective:    Adam Long today has no fevers, no emesis,  No chest pain, resting comfortably, no chest pains no dizziness no palpitations no shortness of breath at rest  Assessment  & Plan :    Principal Problem:   Cellulitis and abscess of lower extremity Active Problems:   Uncontrolled type 2 diabetes mellitus with complication (HCC)   Morbid obesity (HCC)   HTN (hypertension)   Mixed hyperlipidemia   Personal history of noncompliance with medical treatment, presenting hazards to health   Cellulitis of fourth toe of left foot   Brief summary 43 y.o. male past medical history relevant for depression /anxiety, T2DM, HTN, HLD, morbid obesity  admitted on 06/11/2019 with L foot infection & s/p near syncope episode  -On 06/07/2019 patient stepped on a metal tack in his kitchen, it pierced through his sneakers and punctured his lt foot--over the last few days patient has noticed increased swelling, warmth, redness---there is  Desquamation/blistering streaking of the lt foot -   A/p 1)Left foot wound infection/cellulitis---  -WBC is down to 8.7 from 10.6, MRSA PCR negative, okay to de-escalate no further vancomycin, continue Zosyn --Given puncture wound to his foot thru his sneakers- with a metal tack in a diabetic patient need to adequately cover for polymicrobial pathology including Pseudomonas--once clinically improved may transition from Zosyn to quinolone --Patient already received tetanus booster -Blood cultures NGTD -Surgical consult from Dr. Arnoldo Morale appreciated -Arterial Dopplers with ABI are  non-diagnostic -MRA runoff may be needed for further evaluation of his lower extremity arterial circulation  2)severe aortic stenosis--- get cardiology input, patient had near syncopal episode PTA admission, ???  For evaluation for possible TAVR  3)HFpEF--echo from 06/12/2019 with EF of 60% with diastolic dysfunction -Patient appears euvolemic/compensated at this time  4)DM2-last A1c was 9.6, patient has uncontrolled diabetes and is noncompliant--- continue Tradjenta and metformin and  Use Novolog/Humalog Sliding scale insulin with Accu-Cheks/Fingersticks as ordered  5) morbid obesity--this complicates overall care, BMI over 54, lifestyle and dietary modifications discussed  6)history of depression and anxiety--c/n trazodone 100 mg nightly   Disposition/Need for in-Hospital Stay- patient unable to be discharged at this time due to infected left foot and left fourth toe wound requiring IV antibiotics--also may need further evaluation for severe aortic stenosis*  Code Status : Full  Family Communication:   NA (patient is alert, awake and coherent)   Disposition Plan  : Pending cardiology and general surgery evaluation  Consults  : General surgery and cardiology  DVT Prophylaxis  :   Heparin - SCDs  Lab Results  Component Value Date   PLT 201 06/12/2019    Inpatient Medications  Scheduled Meds:  atorvastatin  20 mg Oral Daily   heparin  5,000 Units Subcutaneous Q8H   insulin aspart  0-5 Units Subcutaneous QHS   insulin aspart  0-9 Units Subcutaneous TID WC   linagliptin  5 mg Oral Daily   And   metFORMIN  500 mg Oral BID WC   sodium chloride flush  3 mL Intravenous Q12H   traZODone  100 mg Oral QHS   Continuous Infusions:  sodium chloride     sodium chloride 75 mL/hr at 06/12/19 0711   piperacillin-tazobactam (ZOSYN)  IV 3.375 g (06/12/19 1604)   PRN Meds:.sodium chloride, acetaminophen **OR** acetaminophen, albuterol, hydrALAZINE, ondansetron **OR**  ondansetron (ZOFRAN) IV, polyethylene glycol, sodium chloride flush    Anti-infectives (From admission, onward)   Start     Dose/Rate Route Frequency Ordered Stop   06/11/19 2200  piperacillin-tazobactam (ZOSYN) IVPB 3.375 g     3.375 g 12.5 mL/hr over 240 Minutes Intravenous Every 8 hours 06/11/19 1538     06/11/19 1315  vancomycin (VANCOCIN) IVPB 1000 mg/200 mL premix     1,000 mg 200 mL/hr over 60 Minutes Intravenous  Once 06/11/19 1314 06/11/19 1451   06/11/19 1315  piperacillin-tazobactam (ZOSYN) IVPB 3.375 g     3.375 g 100 mL/hr over 30 Minutes Intravenous  Once 06/11/19 1314 06/11/19 1358        Objective:   Vitals:   06/11/19 1706 06/11/19 2232 06/12/19 0641 06/12/19 1300  BP: 121/76 (!) 122/93 123/69 122/74  Pulse: (!) 105 (!) 107 91 92  Resp: 20 20 (!) 21 20  Temp: 98.7 F (37.1 C) 98.2 F (36.8 C) 98.6 F (37 C) 98.4 F (36.9 C)  TempSrc: Oral Oral Oral Oral  SpO2: 98% 96% 98% 98%  Weight: (!) 171.4 kg     Height: 5\' 11"  (1.803 m)       Wt Readings from Last 3 Encounters:  06/11/19 (!) 171.4 kg  09/12/18 (!) 170.1 kg  03/16/18 (!) 181.4 kg     Intake/Output Summary (Last 24 hours) at 06/12/2019 2036 Last data filed at 06/12/2019 1826 Gross per 24 hour  Intake 1917.31 ml  Output 1150 ml  Net 767.31 ml     Physical Exam  Gen:- Awake Alert, morbidly obese, no conversational dyspnea HEENT:- [place].AT, No sclera icterus Neck-Supple Neck,No JVD,.  Lungs-  CTAB , fair symmetrical air movement CV- S1, S2 normal, regular  Abd-  +ve B.Sounds, Abd Soft, No tenderness,    Extremity/Skin:-Warm and dry, pedal pulses present  Psych-affect is appropriate, oriented x3 Neuro-no new focal deficits, no tremors MSK/Lt FOOT--- please see epic photos below -Media Information    Document Information  Photos    06/11/2019 13:17  Attached To:  Hospital Encounter on 06/11/19  Source Information  Petrucelli, Lucius Conn   [facility]   - Media  Information    Document Information  Photos    06/11/2019 13:17  Attached To:  Hospital Encounter on 06/11/19  Source Information  Petrucelli, Pleas Koch, PA-C   [facility]      Data Review:   Micro Results Recent Results (from the past 240 hour(s))  Culture, blood (Routine x 2)     Status: None (Preliminary result)   Collection Time: 06/11/19  1:05 PM   Specimen: BLOOD RIGHT FOREARM  Result Value Ref Range Status   Specimen Description   Final    BLOOD RIGHT FOREARM BOTTLES DRAWN AEROBIC AND ANAEROBIC   Special Requests Blood Culture adequate volume  Final   Culture   Final    NO GROWTH < 24 HOURS Performed at [facility], [address], [place], [place] [address]    Report Status PENDING  Incomplete  Culture, blood (Routine  x 2)     Status: None (Preliminary result)   Collection Time: 06/11/19  1:10 PM   Specimen: Right Antecubital; Blood  Result Value Ref Range Status   Specimen Description   Final    RIGHT ANTECUBITAL BOTTLES DRAWN AEROBIC AND ANAEROBIC   Special Requests Blood Culture adequate volume  Final   Culture   Final    NO GROWTH < 24 HOURS Performed at [facility], [address] [place], [place] [address]    Report Status PENDING  Incomplete  SARS Coronavirus 2 [facility](Hospital order, Performed in [facility] lab) Nasopharyngeal Urine, Clean Catch     Status: None   Collection Time: 06/11/19  2:30 PM   Specimen: Urine, Clean Catch; Nasopharyngeal  Result Value Ref Range Status   SARS Coronavirus 2 NEGATIVE NEGATIVE Final    Comment: (NOTE) If result is NEGATIVE SARS-CoV-2 target nucleic acids are NOT DETECTED. The SARS-CoV-2 RNA is generally detectable in upper and lower  respiratory specimens during the acute phase of infection. The lowest  concentration of SARS-CoV-2 viral copies this assay can detect is 250  copies / mL. A negative result does not preclude SARS-CoV-2 infection  and should not be used as the sole basis for treatment  or other  patient management decisions.  A negative result may occur with  improper specimen collection / handling, submission of specimen other  than nasopharyngeal swab, presence of viral mutation(s) within the  areas targeted by this assay, and inadequate number of viral copies  (<250 copies / mL). A negative result must be combined with clinical  observations, patient history, and epidemiological information. If result is POSITIVE SARS-CoV-2 target nucleic acids are DETECTED. The SARS-CoV-2 RNA is generally detectable in upper and lower  respiratory specimens dur ing the acute phase of infection.  Positive  results are indicative of active infection with SARS-CoV-2.  Clinical  correlation with patient history and other diagnostic information is  necessary to determine patient infection status.  Positive results do  not rule out bacterial infection or co-infection with other viruses. If result is PRESUMPTIVE POSTIVE SARS-CoV-2 nucleic acids MAY BE PRESENT.   A presumptive positive result was obtained on the submitted specimen  and confirmed on repeat testing.  While 2019 novel coronavirus  (SARS-CoV-2) nucleic acids may be present in the submitted sample  additional confirmatory testing may be necessary for epidemiological  and / or clinical management purposes  to differentiate between  SARS-CoV-2 and other Sarbecovirus currently known to infect humans.  If clinically indicated additional testing with an alternate test  methodology [phone] is advised. The SARS-CoV-2 RNA is generally  detectable in upper and lower respiratory sp ecimens during the acute  phase of infection. The expected result is Negative. Fact Sheet for Patients:  BoilerBrush.com.cyhttps://www.fda.gov/media/136312/download Fact Sheet for Healthcare Providers: https://pope.com/https://www.fda.gov/media/136313/download This test is not yet approved or cleared by the [place] FDA and has been authorized for detection and/or diagnosis of  SARS-CoV-2 by FDA under an Emergency Use Authorization (EUA).  This EUA will remain in effect (meaning this test can be used) for the duration of the COVID-19 declaration under Section 564(b)(1) of the Act, 21 U.S.C. section 360bbb-3(b)(1), unless the authorization is terminated or revoked sooner. Performed at [facility], [address] [place], [place] [address]   MRSA PCR Screening     Status: None   Collection Time: 06/11/19 10:19 PM   Specimen: Nasal Mucosa; Nasopharyngeal  Result Value Ref Range Status   MRSA by PCR NEGATIVE  NEGATIVE Final    Comment:        The GeneXpert MRSA Assay (FDA approved for NASAL specimens only), is one component of a comprehensive MRSA colonization surveillance program. It is not intended to diagnose MRSA infection nor to guide or monitor treatment for MRSA infections. Performed at [facility], [address], [place], [place] [address]     Radiology Reports US Carotid Bilateral  Result Date: 06/11/2019 CLINICAL DATA:  43 year old male with a history of syncope EXAM: BILATERAL CAROTID DUPLEX ULTRASOUND TECHNIQUE: Wallace Cullens scale imaging, color Doppler and duplex ultrasound were performed of bilateral carotid and vertebral arteries in the neck. COMPARISON:  None. FINDINGS: Criteria: Quantification of carotid stenosis is based on velocity parameters that correlate the residual internal carotid diameter with NASCET-based stenosis levels, using the diameter of the distal internal carotid lumen as the denominator for stenosis measurement. The following velocity measurements were obtained: RIGHT ICA:  Systolic 67 cm/sec, Diastolic 27 cm/sec CCA:  66 cm/sec SYSTOLIC ICA/CCA RATIO:  1.0 ECA:  61 cm/sec LEFT ICA:  Systolic 98 cm/sec, Diastolic 27 cm/sec CCA:  67 cm/sec SYSTOLIC ICA/CCA RATIO:  1.5 ECA:  70 cm/sec Right Brachial SBP: Not acquired Left Brachial SBP: Not acquired RIGHT CAROTID ARTERY: No significant calcified disease of the right common carotid artery.  Intermediate waveform maintained. No significant plaque at the right carotid bifurcation. Low resistance waveform of the right ICA. No significant tortuosity. RIGHT VERTEBRAL ARTERY: Antegrade flow with low resistance waveform. LEFT CAROTID ARTERY: No significant calcified disease of the left common carotid artery. Intermediate waveform maintained. No significant plaque at the right carotid bifurcation. Low resistance waveform of the left ICA. LEFT VERTEBRAL ARTERY:  Antegrade flow with low resistance waveform. IMPRESSION: Color duplex indicates no significant plaque, with no hemodynamically significant stenosis by duplex criteria in the extracranial cerebrovascular circulation. Signed, Yvone Neu. Reyne Dumas, RPVI [facility] Electronically Signed   By: Gilmer Mor D.O.   On: 06/11/2019 16:15   US Arterial Seg Multiple Le (abi, Segmental Pressures, Pvr's)  Result Date: 06/12/2019 CLINICAL DATA:  Left fourth toe cellulitis EXAM: NONINVASIVE PHYSIOLOGIC VASCULAR STUDY OF BILATERAL LOWER EXTREMITIES TECHNIQUE: Non-invasive vascular evaluation of both lower extremities was performed at rest, including calculation of ankle-brachial indices, multiple segmental pressure evaluation, segmental Doppler and segmental pulse volume recording. COMPARISON:  None. FINDINGS: Right Lower Extremity Resting ABI: Non calculable due to extensive lower extremity arterial noncompressibility Resting TBI: Elevated likely secondary to arterial noncompressibility Segmental Pressures: Nondiagnostic due to multilevel arterial noncompressibility. Arterial Waveforms: Normal tri-phasic arterial waveforms through the posterior tibial and dorsalis pedis arteries. PVRs: Unremarkable Left Lower Extremity: Resting ABI: Non calculable due to extensive lower extremity arterial noncompressibility Resting TBI: Elevated likely secondary to arterial noncompressibility Segmental Pressures:  Nondiagnostic due to multilevel arterial noncompressibility Arterial Waveforms: Monophasic arterial waveforms throughout the left lower extremity PVRs: Mildly attenuated at the calf level of but increased at the ankle level compared to contralateral side. Other: Symmetric upper extremity pressures. IMPRESSION: Limited evaluation, largely nondiagnostic secondary to incompressible vessel calcifications. Should persistent symptoms warrant further diagnostic evaluation, consider MRA runoff (preferred over CTA in the presence of extensive vascular calcifications). Electronically Signed   By: Corlis Leak M.D.   On: 06/12/2019 17:14   Dg Chest Port 1 View  Result Date: 06/11/2019 CLINICAL DATA:  43 year old male with possible sepsis. EXAM: PORTABLE CHEST 1 VIEW COMPARISON:  Chest radiograph dated 07/02/2013 FINDINGS: The lungs are clear. There is no pleural effusion or pneumothorax.  Top-normal cardiac silhouette. No acute osseous pathology. IMPRESSION: No active disease. Electronically Signed   By: Elgie CollardArash  Radparvar M.D.   On: 06/11/2019 13:51   Dg Foot Complete Left  Result Date: 06/11/2019 CLINICAL DATA:  Left foot redness, pain and swelling since the patient stepped on a tack 06/07/2019. Wound next to the little toe. Initial encounter. EXAM: LEFT FOOT - COMPLETE 3+ VIEW COMPARISON:  None. FINDINGS: There is no evidence of fracture or dislocation. There is no evidence of arthropathy or other focal bone abnormality. Soft tissues are unremarkable. IMPRESSION: Negative exam. Electronically Signed   By: Drusilla Kannerhomas  Dalessio M.D.   On: 06/11/2019 13:51     CBC Recent Labs  Lab 06/11/19 1305 06/12/19 0443  WBC 10.6* 8.7  HGB 15.0 14.1  HCT 46.1 43.4  PLT 189 201  MCV 90.4 91.8  MCH 29.4 29.8  MCHC 32.5 32.5  RDW 12.8 12.9  LYMPHSABS 1.2  --   MONOABS 0.7  --   EOSABS 0.1  --   BASOSABS 0.1  --     Chemistries  Recent Labs  Lab 06/11/19 1305 06/12/19 0443  NA 131* 135  K 3.8 3.9  CL 97* 101  CO2  19* 25  GLUCOSE 408* 299*  BUN 12 11  CREATININE 0.70 0.67  CALCIUM 8.9 8.6*  AST 114*  --   ALT 85*  --   ALKPHOS 74  --   BILITOT 1.4*  --    ------------------------------------------------------------------------------------------------------------------ No results for input(s): CHOL, HDL, LDLCALC, TRIG, CHOLHDL, LDLDIRECT in the last 72 hours.  Lab Results  Component Value Date   HGBA1C 9.6 (H) 06/01/2018   ------------------------------------------------------------------------------------------------------------------ No results for input(s): TSH, T4TOTAL, T3FREE, THYROIDAB in the last 72 hours.  Invalid input(s): FREET3 ------------------------------------------------------------------------------------------------------------------ No results for input(s): VITAMINB12, FOLATE, FERRITIN, TIBC, IRON, RETICCTPCT in the last 72 hours.  Coagulation profile Recent Labs  Lab 06/11/19 1305  INR 1.1    No results for input(s): DDIMER in the last 72 hours.  Cardiac Enzymes No results for input(s): CKMB, TROPONINI, MYOGLOBIN in the last 168 hours.  Invalid input(s): CK ------------------------------------------------------------------------------------------------------------------ No results found for: BNP   Shon Haleourage Joelly Bolanos M.D on 06/12/2019 at 8:36 PM  Go to [email or web address] - for contact info  [facility] - Office  [phone]

## 2019-06-12 NOTE — Consult Note (Signed)
[facility] Nurse wound consult note Patient receiving care in [facility].  I have sent a SecureChat message to Dr. Maurene Capes asking him to get a surgical consult.  Topical dressings will not solve this foot problem. Consult completed remotely after review of record. Reason for Consult:Left foot wound Wound type: Infected left foot plantar surface and 4th toe in a diabetic Wound bed: necrotic Drainage (amount, consistency, odor) erythematous, edematous Dressing procedure/placement/frequency: Cleanse with saline. Apply A piece of Aquacel Ag+ Kellie Simmering [phone]) into the left foot wound bed (Plantar and dorsal surfaces). Cover with dry gauze. Secure in place with kerlex. Change daily. Monitor the wound area(s) for worsening of condition such as: Signs/symptoms of infection,  Increase in size,  Development of or worsening of odor, Development of pain, or increased pain at the affected locations.  Notify the medical team if any of these develop.  Thank you for the consult.  [facility] nurse will not follow at this time.  Please re-consult the [facility] team if needed.  Val Riles, RN, MSN, CWOCN, CNS-BC, pager [phone]

## 2019-06-13 ENCOUNTER — Encounter (HOSPITAL_COMMUNITY): Payer: Self-pay | Admitting: Student

## 2019-06-13 ENCOUNTER — Inpatient Hospital Stay (HOSPITAL_COMMUNITY): Payer: Self-pay

## 2019-06-13 DIAGNOSIS — E1165 Type 2 diabetes mellitus with hyperglycemia: Secondary | ICD-10-CM

## 2019-06-13 DIAGNOSIS — L02419 Cutaneous abscess of limb, unspecified: Secondary | ICD-10-CM

## 2019-06-13 DIAGNOSIS — E782 Mixed hyperlipidemia: Secondary | ICD-10-CM

## 2019-06-13 DIAGNOSIS — E118 Type 2 diabetes mellitus with unspecified complications: Secondary | ICD-10-CM

## 2019-06-13 DIAGNOSIS — R55 Syncope and collapse: Secondary | ICD-10-CM

## 2019-06-13 DIAGNOSIS — L03119 Cellulitis of unspecified part of limb: Secondary | ICD-10-CM

## 2019-06-13 DIAGNOSIS — I10 Essential (primary) hypertension: Secondary | ICD-10-CM

## 2019-06-13 DIAGNOSIS — I350 Nonrheumatic aortic (valve) stenosis: Secondary | ICD-10-CM

## 2019-06-13 LAB — GLUCOSE, CAPILLARY
Glucose-Capillary: 289 mg/dL — ABNORMAL HIGH (ref 70–99)
Glucose-Capillary: 305 mg/dL — ABNORMAL HIGH (ref 70–99)
Glucose-Capillary: 224 mg/dL — ABNORMAL HIGH (ref 70–99)
Glucose-Capillary: 278 mg/dL — ABNORMAL HIGH (ref 70–99)

## 2019-06-13 LAB — HEMOGLOBIN A1C
Hgb A1c MFr Bld: 12 % — ABNORMAL HIGH (ref 4.8–5.6)
Mean Plasma Glucose: 298 mg/dL

## 2019-06-13 MED ORDER — PHENOL 1.4 % MT LIQD
1.0000 | OROMUCOSAL | Status: DC | PRN
  Administered 2019-06-14: 1 via OROMUCOSAL
  Filled 2019-06-13: qty 177

## 2019-06-13 MED ORDER — GUAIFENESIN-DM 100-10 MG/5ML PO SYRP
5.0000 mL | ORAL_SOLUTION | ORAL | Status: DC | PRN
  Administered 2019-06-14: 5 mL via ORAL
  Filled 2019-06-13: qty 5

## 2019-06-13 NOTE — Consult Note (Addendum)
Cardiology Consult    Patient ID: Adam Long; [id number]; [birth year]   Admit date: 06/11/2019 Date of Consult: 06/13/2019  Primary Care Provider: Jacqu[facility], PA-C Primary Cardiologist: New to [facility] - Dr. Purvis Sheffield  Patient Profile    Adam Long is a 43 y.o. male with past medical history of HTN, HLD, Type 2 DM, and obesity who is being seen today for the evaluation of severe aortic stenosis at the request of Dr. Mariea Clonts.   History of Present Illness    [name] presented to [facility] on 06/11/2019 after having developed swelling and erythema to his left foot after stepping on a tack the week prior. He also reported having a near syncopal episode the morning of admission.  Upon arrival to the ED he was initially tachycardic with heart rate in the 120's and was mildly hypotensive with BP at 98/65. Labs showed WBC 10.6, Hgb 15.0, platelets 189, Na+ 131, K+ 3.8, and creatinine 0.70.  Glucose elevated to 408. AST 114 and ALT 85. Lactic Acid normal. COVID negative. Blood cultures negative.  CXR showed no active disease.  Imaging of his foot showed no acute findings. EKG showed sinus tachycardia, HR 122 with nonspecific ST abnormality along V4-V6.   He was admitted for further management of his left wound infection/cellulitis and was started on Vancomycin and Zosyn. General Surgery was consulted and recommended ABI's which were nondiagnostic secondary to incompressible vessel calcifications. Carotid Dopplers and echocardiogram were obtained for further evaluation of his presyncope. Carotid Doppler showed no significant plaque with no significant stenosis appreciated. Echocardiogram showed a preserved EF of 55 to 60% with no regional wall motion abnormalities. He did have severe calcification of the aortic valve along with severe stenosis and a calculated valve area of 1.01 cm.  In talking with the patient today, he reports no known cardiac history. Says he has never been  informed he has a heart murmur. Reports his father has known CAD and required CABG in his 51's. He also reports his sister has a known heart murmur which has been followed since birth.  He stays active at home and denies any recent chest pain or dyspnea on exertion when performing routine activities including household chores or yard work. Says that he did experience a presyncopal episode the morning of admission and he recalls feeling dizzy upon standing. He eased himself down to the ground and did not fall. He is unsure if he actually lost consciousness but denies any associated chest pain or palpitations at that time. He denies any prior syncopal episodes. No recent orthopnea or PND. He does experience intermittent lower extremity edema but is not on diuretic therapy.   Past Medical History:  Diagnosis Date   Anxiety    Cholelithiasis    Depression    Diabetes mellitus    Hypercholesteremia    Hypertension    Morbid obesity (HCC)     History reviewed. No pertinent surgical history.   Home Medications:  Prior to Admission medications   Medication Sig Start Date End Date Taking? Authorizing Provider  atorvastatin (LIPITOR) 20 MG tablet Take 1 tablet (20 mg total) by mouth daily. 06/07/19  Yes [facility], PA-C  lisinopril (ZESTRIL) 20 MG tablet TAKE 1 Tablet BY MOUTH ONCE DAILY FOR BLOOD PRESSURE 06/07/19  Yes Jacquelin [facility], PA-C  metoprolol tartrate (LOPRESSOR) 50 MG tablet Take 1 tablet (50 mg total) by mouth 2 (two) times daily. 06/07/19  Yes Jacquelin [facility], PA-C  sitaGLIPtin-metformin (JANUMET) 50-500  MG tablet Take 1 tablet by mouth 2 (two) times daily with a meal.   Yes [provider]    Inpatient Medications: Scheduled Meds:  atorvastatin  20 mg Oral Daily   heparin  5,000 Units Subcutaneous Q8H   insulin aspart  0-5 Units Subcutaneous QHS   insulin aspart  0-9 Units Subcutaneous TID WC   linagliptin  5 mg Oral Daily   And   metFORMIN  500 mg  Oral BID WC   sodium chloride flush  3 mL Intravenous Q12H   traZODone  100 mg Oral QHS   Continuous Infusions:  sodium chloride     sodium chloride 75 mL/hr at 06/12/19 2327   piperacillin-tazobactam (ZOSYN)  IV 3.375 g (06/13/19 0531)   PRN Meds: sodium chloride, acetaminophen **OR** acetaminophen, albuterol, hydrALAZINE, ondansetron **OR** ondansetron (ZOFRAN) IV, polyethylene glycol, sodium chloride flush  Allergies:    Allergies  Allergen Reactions   Pollen Extract Other (See Comments)    Runny nose, watery eyes, sneezing    Social History:   Social History   Socioeconomic History   Marital status: Single    Spouse name: Not on file   Number of children: Not on file   Years of education: Not on file   Highest education level: Not on file  Occupational History   Not on file  Social Needs   Financial resource strain: Not on file   Food insecurity    Worry: Not on file    Inability: Not on file   Transportation needs    Medical: Not on file    Non-medical: Not on file  Tobacco Use   Smoking status: Never Smoker   Smokeless tobacco: Former NeurosurgeonUser    Types: Chew  Substance and Sexual Activity   Alcohol use: Yes    Comment: occasionally   Drug use: No   Sexual activity: Not on file  Lifestyle   Physical activity    Days per week: Not on file    Minutes per session: Not on file   Stress: Not on file  Relationships   Social connections    Talks on phone: Not on file    Gets together: Not on file    Attends religious service: Not on file    Active member of club or organization: Not on file    Attends meetings of clubs or organizations: Not on file    Relationship status: Not on file   Intimate partner violence    Fear of current or ex partner: Not on file    Emotionally abused: Not on file    Physically abused: Not on file    Forced sexual activity: Not on file  Other Topics Concern   Not on file  Social History Narrative   Not  on file     Family History:    Family History  Problem Relation Age of Onset   Cancer Mother        brain   Heart disease Father    Hyperlipidemia Father    Hypertension Father    Stroke Father       Review of Systems    General:  No chills, fever, night sweats or weight changes.  Cardiovascular:  No chest pain, dyspnea on exertion, edema, orthopnea, palpitations, paroxysmal nocturnal dyspnea. Positive for presyncope.  Dermatological: No rash, lesions/masses Respiratory: No cough, dyspnea Urologic: No hematuria, dysuria Abdominal:   No nausea, vomiting, diarrhea, bright red blood per rectum, melena, or hematemesis Neurologic:  No  visual changes, wkns, changes in mental status. All other systems reviewed and are otherwise negative except as noted above.  Physical Exam/Data    Vitals:   06/12/19 0641 06/12/19 1300 06/12/19 2116 06/13/19 0535  BP: 123/69 122/74 131/88 130/85  Pulse: 91 92 94 92  Resp: (!) 21 20 17 18   Temp: 98.6 F (37 C) 98.4 F (36.9 C) 98.7 F (37.1 C) 98 F (36.7 C)  TempSrc: Oral Oral Oral Oral  SpO2: 98% 98% 97% 100%  Weight:      Height:        Intake/Output Summary (Last 24 hours) at 06/13/2019 0758 Last data filed at 06/13/2019 0535 Gross per 24 hour  Intake 2402.97 ml  Output 200 ml  Net 2202.97 ml   Filed Weights   06/11/19 1301 06/11/19 1706  Weight: (!) 172.4 kg (!) 171.4 kg   Body mass index is 52.7 kg/m.   General: Pleasant obese male appearing in NAD Psych: Normal affect. Neuro: Alert and oriented X 3. Moves all extremities spontaneously. HEENT: Normal  Neck: Supple without bruits or JVD. Lungs:  Resp regular and unlabored, CTA without wheezing or rales. Heart: RRR no s3, s4, 3/6 SEM along RUSB.  Abdomen: Soft, non-tender, non-distended, BS + x 4.  Extremities: No clubbing or cyanosis. 1+ pitting edema bilaterally. Left foot wrapped.   EKG:  The EKG was personally reviewed and demonstrates: Sinus tachycardia, HR 122  with nonspecific ST abnormality along V4-V6.    Telemetry:  Telemetry was personally reviewed and demonstrates: NSR, HR in 80's to 90's.    Labs/Studies     Relevant CV Studies:  Carotid Dopplers: 06/11/2019 IMPRESSION: Color duplex indicates no significant plaque, with no hemodynamically significant stenosis by duplex criteria in the extracranial cerebrovascular circulation.  Echocardiogram: 06/12/2019 IMPRESSIONS   1. The left ventricle has normal systolic function, with an ejection fraction of 55-60%. The cavity size was normal. There is mildly increased left ventricular wall thickness. Left ventricular diastolic Doppler parameters are consistent with  pseudonormalization. Elevated mean left atrial pressure.  2. The right ventricle has normal systolic function. The cavity was normal. There is no increase in right ventricular wall thickness.  3. The aortic valve has an indeterminate number of cusps. Severely thickening of the aortic valve. Severe calcifcation of the aortic valve. Severe stenosis of the aortic valve. Severe aortic annular calcification noted.  4. The mitral valve is abnormal. Moderate thickening of the mitral valve leaflet. Moderate calcification of the mitral valve leaflet. There is moderate mitral annular calcification present. No evidence of mitral valve stenosis.  5. The aorta is normal unless otherwise noted.  6. The aortic root is normal in size and structure.  7. Pulmonary hypertension is indeterminant, inadequate TR jet.  8. The interatrial septum was not well visualized.  ABI's: 06/12/2019 IMPRESSION: Limited evaluation, largely nondiagnostic secondary to incompressible vessel calcifications. Should persistent symptoms warrant further diagnostic evaluation, consider MRA runoff (preferred over CTA in the presence of extensive vascular calcifications).  Laboratory Data:  Chemistry Recent Labs  Lab 06/11/19 1305 06/12/19 0443  NA 131* 135  K 3.8  3.9  CL 97* 101  CO2 19* 25  GLUCOSE 408* 299*  BUN 12 11  CREATININE 0.70 0.67  CALCIUM 8.9 8.6*  GFRNONAA >60 >60  GFRAA >60 >60  ANIONGAP 15 9    Recent Labs  Lab 06/11/19 1305  PROT 7.1  ALBUMIN 3.4*  AST 114*  ALT 85*  ALKPHOS 74  BILITOT 1.4*   Hematology  Recent Labs  Lab 06/11/19 1305 06/12/19 0443  WBC 10.6* 8.7  RBC 5.10 4.73  HGB 15.0 14.1  HCT 46.1 43.4  MCV 90.4 91.8  MCH 29.4 29.8  MCHC 32.5 32.5  RDW 12.8 12.9  PLT 189 201   Cardiac EnzymesNo results for input(s): TROPONINI in the last 168 hours. No results for input(s): TROPIPOC in the last 168 hours.  BNPNo results for input(s): BNP, PROBNP in the last 168 hours.  DDimer No results for input(s): DDIMER in the last 168 hours.  Radiology/Studies:  [place] Carotid Bilateral  Result Date: 06/11/2019 CLINICAL DATA:  43 year old male with a history of syncope EXAM: BILATERAL CAROTID DUPLEX ULTRASOUND TECHNIQUE: Wallace CullensGray scale imaging, color Doppler and duplex ultrasound were performed of bilateral carotid and vertebral arteries in the neck. COMPARISON:  None. FINDINGS: Criteria: Quantification of carotid stenosis is based on velocity parameters that correlate the residual internal carotid diameter with NASCET-based stenosis levels, using the diameter of the distal internal carotid lumen as the denominator for stenosis measurement. The following velocity measurements were obtained: RIGHT ICA:  Systolic 67 cm/sec, Diastolic 27 cm/sec CCA:  66 cm/sec SYSTOLIC ICA/CCA RATIO:  1.0 ECA:  61 cm/sec LEFT ICA:  Systolic 98 cm/sec, Diastolic 27 cm/sec CCA:  67 cm/sec SYSTOLIC ICA/CCA RATIO:  1.5 ECA:  70 cm/sec Right Brachial SBP: Not acquired Left Brachial SBP: Not acquired RIGHT CAROTID ARTERY: No significant calcified disease of the right common carotid artery. Intermediate waveform maintained. No significant plaque at the right carotid bifurcation. Low resistance waveform of the right ICA. No significant tortuosity. RIGHT  VERTEBRAL ARTERY: Antegrade flow with low resistance waveform. LEFT CAROTID ARTERY: No significant calcified disease of the left common carotid artery. Intermediate waveform maintained. No significant plaque at the right carotid bifurcation. Low resistance waveform of the left ICA. LEFT VERTEBRAL ARTERY:  Antegrade flow with low resistance waveform. IMPRESSION: Color duplex indicates no significant plaque, with no hemodynamically significant stenosis by duplex criteria in the extracranial cerebrovascular circulation. Signed, Yvone NeuJaime S. Reyne DumasWagner, DO, RPVI [facility] Electronically Signed   By: Gilmer MorJaime  Wagner D.O.   On: 06/11/2019 16:15   [place] Arterial Seg Multiple Le (abi, Segmental Pressures, Pvr's)  Result Date: 06/12/2019 CLINICAL DATA:  Left fourth toe cellulitis EXAM: NONINVASIVE PHYSIOLOGIC VASCULAR STUDY OF BILATERAL LOWER EXTREMITIES TECHNIQUE: Non-invasive vascular evaluation of both lower extremities was performed at rest, including calculation of ankle-brachial indices, multiple segmental pressure evaluation, segmental Doppler and segmental pulse volume recording. COMPARISON:  None. FINDINGS: Right Lower Extremity Resting ABI: Non calculable due to extensive lower extremity arterial noncompressibility Resting TBI: Elevated likely secondary to arterial noncompressibility Segmental Pressures: Nondiagnostic due to multilevel arterial noncompressibility. Arterial Waveforms: Normal tri-phasic arterial waveforms through the posterior tibial and dorsalis pedis arteries. PVRs: Unremarkable Left Lower Extremity: Resting ABI: Non calculable due to extensive lower extremity arterial noncompressibility Resting TBI: Elevated likely secondary to arterial noncompressibility Segmental Pressures: Nondiagnostic due to multilevel arterial noncompressibility Arterial Waveforms: Monophasic arterial waveforms throughout the left lower extremity PVRs: Mildly attenuated at  the calf level of but increased at the ankle level compared to contralateral side. Other: Symmetric upper extremity pressures. IMPRESSION: Limited evaluation, largely nondiagnostic secondary to incompressible vessel calcifications. Should persistent symptoms warrant further diagnostic evaluation, consider MRA runoff (preferred over CTA in the presence of extensive vascular calcifications). Electronically Signed   By: Corlis Leak  Hassell M.D.   On: 06/12/2019 17:14   Dg Chest Port 1 View  Result Date: 06/11/2019 CLINICAL DATA:  43 year old  male with possible sepsis. EXAM: PORTABLE CHEST 1 VIEW COMPARISON:  Chest radiograph dated 07/02/2013 FINDINGS: The lungs are clear. There is no pleural effusion or pneumothorax. Top-normal cardiac silhouette. No acute osseous pathology. IMPRESSION: No active disease. Electronically Signed   By: Elgie Collard M.D.   On: 06/11/2019 13:51   Dg Foot Complete Left  Result Date: 06/11/2019 CLINICAL DATA:  Left foot redness, pain and swelling since the patient stepped on a tack 06/07/2019. Wound next to the little toe. Initial encounter. EXAM: LEFT FOOT - COMPLETE 3+ VIEW COMPARISON:  None. FINDINGS: There is no evidence of fracture or dislocation. There is no evidence of arthropathy or other focal bone abnormality. Soft tissues are unremarkable. IMPRESSION: Negative exam. Electronically Signed   By: Drusilla Kanner M.D.   On: 06/11/2019 13:51     Assessment & Plan    1. Severe Aortic Stenosis - He presented with a presyncopal episode in the setting of a wound infection but denies any recent chest pain or dyspnea on exertion.  No prior syncopal episodes.  Echocardiogram this admission shows a preserved EF is 55 to 60% but he was found to have severe aortic stenosis.  Per his report he has never been informed he has a heart murmur. - Reviewed with the patient today that he will require further work-up and likely surgery in the near future for his aortic stenosis once he has  recovered from his wound infection. Will need to have a R/LHC as an outpatient and follow-up with the Structural Heart Team once recovered from his acute illness.   2. HTN - BP has been well controlled at 122/74 -131/88 within the past 24 hours. On Lisinopril 20mg  daily and Lopressor 50mg  BID prior to admission.   3. HLD - followed by PCP. FLP in 05/2018 showed total cholesterol 164, triglycerides 274, HDL 34, and LDL 75. He has been continued on PTA Atorvastatin 20 mg daily.  4. Uncontrolled Type 2 DM - Hgb A1c elevated to 12.0 this admission. Further management per admitting team.   5. Left Foot Wound Infection - He was initially on Vancomycin and Zosyn with Vancomycin being discontinued. If he were to require surgical intervention this would need to be performed at [facility] given his severe aortic stenosis but briefly discussed with the surgical team today and there are no plans for intervention at this time.  For questions or updates, please contact [facility] Please consult [email or web address] for contact info under Cardiology/STEMI.  Signed, Ellsworth Lennox, PA-C 06/13/2019, 7:58 AM Pager: [phone]  The patient was seen and examined, and I agree with the history, physical exam, assessment and plan as documented above, with modifications as noted below. I have also personally reviewed all relevant documentation, old records, labs, and both radiographic and cardiovascular studies. I have also independently interpreted old and new ECG's.  Briefly, this is a 43 year old male with a history of hypertension, hyperlipidemia, and type 2 diabetes mellitus who presented with left foot swelling and erythema and was ultimately diagnosed with cellulitis and has been on antimicrobial therapy.  He presented with presyncope with a heart rate in the 120 bpm range and blood pressure in the 90/60 range.  [place] and is quite active feeding animals and mowing the grass.  He denies any  exertional chest pain and dyspnea.  He denies palpitations, presyncope, and syncope prior to this episode.  He was incidentally found to have severe aortic stenosis with normal LV systolic function.  He told me his sister has a heart murmur but did not require any intervention.  His father underwent CABG in his 68s.  He appears to have asymptomatic severe aortic stenosis.  I told him about the natural progression of aortic stenosis and the eventual need for valve replacement surgery.  He does not require this at this time.  I told him that once he becomes symptomatic he would need intervention.  This would entail preoperative right and left heart catheterization with coronary angiography.  We will arrange for outpatient follow-up.  No further recommendations at this time.  We will sign off.    Prentice Docker, MD, [facility]  06/13/2019 9:17 AM

## 2019-06-13 NOTE — Care Management (Signed)
[place] voucher given and explained to patient. He follows at [facility] and had been using med assist for medications. Reports they referred him to the [facility] last week to apply for med assist again. He plans to follow up with [facility] post discharge and will use [facility] voucher medication needs now. Plans to use [facility].   Discussed foot and dressing changes. He had been doing dressing changes prior to arrival here and fills he can manage this at home. Will have follow up with Dr. Arnoldo Morale in 2 weeks.

## 2019-06-13 NOTE — Progress Notes (Signed)
Subjective: No left foot pain  Objective: Vital signs in last 24 hours: Temp:  [98 F (36.7 C)-98.7 F (37.1 C)] 98 F (36.7 C) (09/16 0535) Pulse Rate:  [92-94] 92 (09/16 0535) Resp:  [17-20] 18 (09/16 0535) BP: (122-131)/(74-88) 130/85 (09/16 0535) SpO2:  [97 %-100 %] 100 % (09/16 0535) Last BM Date: 06/11/19  Intake/Output from previous day: 09/15 0701 - 09/16 0700 In: 2403 [P.O.:720; I.V.:1550.9; IV Piggyback:132.1] Out: 200 [Urine:200] Intake/Output this shift: No intake/output data recorded.  General appearance: alert, cooperative and no distress Extremities: Left foot with less edema and erythema.  Lab Results:  Recent Labs    06/11/19 1305 06/12/19 0443  WBC 10.6* 8.7  HGB 15.0 14.1  HCT 46.1 43.4  PLT 189 201   BMET Recent Labs    06/11/19 1305 06/12/19 0443  NA 131* 135  K 3.8 3.9  CL 97* 101  CO2 19* 25  GLUCOSE 408* 299*  BUN 12 11  CREATININE 0.70 0.67  CALCIUM 8.9 8.6*   PT/INR Recent Labs    06/11/19 1305  LABPROT 13.8  INR 1.1    Studies/Results: US Carotid Bilateral  Result Date: 06/11/2019 CLINICAL DATA:  43 year old male with a history of syncope EXAM: BILATERAL CAROTID DUPLEX ULTRASOUND TECHNIQUE: Pearline Cables scale imaging, color Doppler and duplex ultrasound were performed of bilateral carotid and vertebral arteries in the neck. COMPARISON:  None. FINDINGS: Criteria: Quantification of carotid stenosis is based on velocity parameters that correlate the residual internal carotid diameter with NASCET-based stenosis levels, using the diameter of the distal internal carotid lumen as the denominator for stenosis measurement. The following velocity measurements were obtained: RIGHT ICA:  Systolic 67 cm/sec, Diastolic 27 cm/sec CCA:  66 cm/sec SYSTOLIC ICA/CCA RATIO:  1.0 ECA:  61 cm/sec LEFT ICA:  Systolic 98 cm/sec, Diastolic 27 cm/sec CCA:  67 cm/sec SYSTOLIC ICA/CCA RATIO:  1.5 ECA:  70 cm/sec Right Brachial SBP: Not acquired Left Brachial  SBP: Not acquired RIGHT CAROTID ARTERY: No significant calcified disease of the right common carotid artery. Intermediate waveform maintained. No significant plaque at the right carotid bifurcation. Low resistance waveform of the right ICA. No significant tortuosity. RIGHT VERTEBRAL ARTERY: Antegrade flow with low resistance waveform. LEFT CAROTID ARTERY: No significant calcified disease of the left common carotid artery. Intermediate waveform maintained. No significant plaque at the right carotid bifurcation. Low resistance waveform of the left ICA. LEFT VERTEBRAL ARTERY:  Antegrade flow with low resistance waveform. IMPRESSION: Color duplex indicates no significant plaque, with no hemodynamically significant stenosis by duplex criteria in the extracranial cerebrovascular circulation. Signed, Dulcy Fanny. Dellia Nims, RPVI [facility] Electronically Signed   By: Corrie Mckusick D.O.   On: 06/11/2019 16:15   US Arterial Seg Multiple Le (abi, Segmental Pressures, Pvr's)  Result Date: 06/12/2019 CLINICAL DATA:  Left fourth toe cellulitis EXAM: NONINVASIVE PHYSIOLOGIC VASCULAR STUDY OF BILATERAL LOWER EXTREMITIES TECHNIQUE: Non-invasive vascular evaluation of both lower extremities was performed at rest, including calculation of ankle-brachial indices, multiple segmental pressure evaluation, segmental Doppler and segmental pulse volume recording. COMPARISON:  None. FINDINGS: Right Lower Extremity Resting ABI: Non calculable due to extensive lower extremity arterial noncompressibility Resting TBI: Elevated likely secondary to arterial noncompressibility Segmental Pressures: Nondiagnostic due to multilevel arterial noncompressibility. Arterial Waveforms: Normal tri-phasic arterial waveforms through the posterior tibial and dorsalis pedis arteries. PVRs: Unremarkable Left Lower Extremity: Resting ABI: Non calculable due to extensive lower extremity arterial  noncompressibility Resting TBI: Elevated likely secondary to arterial noncompressibility  Segmental Pressures: Nondiagnostic due to multilevel arterial noncompressibility Arterial Waveforms: Monophasic arterial waveforms throughout the left lower extremity PVRs: Mildly attenuated at the calf level of but increased at the ankle level compared to contralateral side. Other: Symmetric upper extremity pressures. IMPRESSION: Limited evaluation, largely nondiagnostic secondary to incompressible vessel calcifications. Should persistent symptoms warrant further diagnostic evaluation, consider MRA runoff (preferred over CTA in the presence of extensive vascular calcifications). Electronically Signed   By: Corlis Leak  Hassell M.D.   On: 06/12/2019 17:14   Dg Chest Port 1 View  Result Date: 06/11/2019 CLINICAL DATA:  43 year old male with possible sepsis. EXAM: PORTABLE CHEST 1 VIEW COMPARISON:  Chest radiograph dated 07/02/2013 FINDINGS: The lungs are clear. There is no pleural effusion or pneumothorax. Top-normal cardiac silhouette. No acute osseous pathology. IMPRESSION: No active disease. Electronically Signed   By: Elgie CollardArash  Radparvar M.D.   On: 06/11/2019 13:51   Dg Foot Complete Left  Result Date: 06/11/2019 CLINICAL DATA:  Left foot redness, pain and swelling since the patient stepped on a tack 06/07/2019. Wound next to the little toe. Initial encounter. EXAM: LEFT FOOT - COMPLETE 3+ VIEW COMPARISON:  None. FINDINGS: There is no evidence of fracture or dislocation. There is no evidence of arthropathy or other focal bone abnormality. Soft tissues are unremarkable. IMPRESSION: Negative exam. Electronically Signed   By: Drusilla Kannerhomas  Dalessio M.D.   On: 06/11/2019 13:51    Anti-infectives: Anti-infectives (From admission, onward)   Start     Dose/Rate Route Frequency Ordered Stop   06/11/19 2200  piperacillin-tazobactam (ZOSYN) IVPB 3.375 g     3.375 g 12.5 mL/hr over 240 Minutes Intravenous Every 8 hours 06/11/19 1538      06/11/19 1315  vancomycin (VANCOCIN) IVPB 1000 mg/200 mL premix     1,000 mg 200 mL/hr over 60 Minutes Intravenous  Once 06/11/19 1314 06/11/19 1451   06/11/19 1315  piperacillin-tazobactam (ZOSYN) IVPB 3.375 g     3.375 g 100 mL/hr over 30 Minutes Intravenous  Once 06/11/19 1314 06/11/19 1358      Assessment/Plan: Impression: Cellulitis and ischemia of left fourth toe.  Arterial segmental Dopplers reveal good flow down into the feet.  The arterial blood flow to the left fourth toe was nonexistent.  Patient will subsequently modify this left fourth toe unless the gangrenous tissue becomes more severe.  Given his severe aortic stenosis, any surgical intervention would have to [facility] as per cardiology.  He does not need acute surgical intervention at this point.  Would do 2 weeks of oral antibiotics.  I can see him in my office in 2 weeks if he is discharged today.  Would suggest broad-spectrum antibiotic like Augmentin.  LOS: 2 days    Franky MachoMark Loa Idler 06/13/2019

## 2019-06-13 NOTE — Progress Notes (Addendum)
Inpatient Diabetes Program Recommendations  AACE/ADA: New Consensus Statement on Inpatient Glycemic Control   Target Ranges:  Prepandial:   less than 140 mg/dL      Peak postprandial:   less than 180 mg/dL (1-2 hours)      Critically ill patients:  140 - 180 mg/dL   Results for Adam Long, Adam Long (MRN [id number]) as of 06/13/2019 09:18  Ref. Range 06/12/2019 08:00 06/12/2019 11:07 06/12/2019 15:56 06/12/2019 21:18 06/13/2019 07:57  Glucose-Capillary Latest Ref Range: 70 - 99 mg/dL 321 (H) 389 (H) 288 (H) 281 (H) 289 (H)  Results for Adam Long, Adam Long (MRN [id number]) as of 06/13/2019 09:18  Ref. Range 06/01/2018 09:36 06/11/2019 13:04  Hemoglobin A1C Latest Ref Range: 4.8 - 5.6 % 9.6 (H) 12.0 (H)   Review of Glycemic Control  Diabetes history: DM2 Outpatient Diabetes medications: Janumet 50-500 mg BID Current orders for Inpatient glycemic control: Metformin 500 mg BID, Tradjenta 5 mg daily, Novolog 0-9 units TID with meals, Novolog 0-5 units QHS  Inpatient Diabetes Program Recommendations:   Insulin: Please consider ordering 70/30 10 units BID (dose will provide a total of 14 units for basal and 6 units for meal coverage per day).  A1C:  A1C 12% on 06/11/19 indicating an average glucose of 298 mg/dl over the past 2-3 months. Patient has taken insulin in the past and is open to taking insulin again if needed to improve DM control  Addendum 06/13/19@13 :40-Spoke with patient about diabetes and home regimen for diabetes control. Patient reports being followed by PCP at [facility] for diabetes management and currently taking Janumet 50-1000 mg BID as an outpatient for diabetes control. Patient reports taking DM medications as prescribed and has been on current DM medication for at least 1 year without any changes. Patient reports he does not check glucose very often due to cost of test strips. Patient has Pimaco Two. Informed patient that test strips for ReliOn glucometer was $9 per box  of 50 test strips.  Inquired about prior A1C and patient reports last A1C was in 9% range. Discussed A1C results (12% on 06/11/19 ) and explained that current A1C indicates an average glucose of 298 mg/dl over the past 2-3 months. Discussed glucose and A1C goals. Discussed importance of checking CBGs and maintaining good CBG control to prevent long-term and short-term complications. Explained how hyperglycemia leads to damage within blood vessels which lead to the common complications seen with uncontrolled diabetes. Stressed to the patient the importance of improving glycemic control to prevent further complications from uncontrolled diabetes especially to promote healing in foot wound. Inquired about patient willingness to take insulin. Patient states that he has taken Lantus insulin in the past (in vial/syringe as well as insulin pens). Patient reports that he would definitely take insulin again if prescribed at discharge. Patient does not have insurance so discussed affordable insulin from [employer] such as NOVOLIN 70/30 ($25 per vial and $43 per box of 5 insulin pens).  Discussed 70/30 insulin and how it is typically taken.  Informed patient that it would be noted that he is willing to take insulin as an outpatient and that CM would be consulted for medication assistance.  Encouraged patient to get test strips for his glucometer and to take DM medications as prescribed.   Patient verbalized understanding of information discussed and reports no further questions at this time related to diabetes. Placed order for CM for medication assistance.   Thanks, Barnie Alderman, RN, MSN, CDE Diabetes Coordinator Inpatient Diabetes Program  [phone] (Team Pager from [place] to [place])

## 2019-06-13 NOTE — Plan of Care (Signed)

## 2019-06-13 NOTE — Progress Notes (Addendum)
Patient Demographics:    Adam Long, is a 43 y.o. male, DOB - May 30, 1976, UVO:[id number]  Admit date - 06/11/2019   Admitting Physician Courage Denton Brick, MD  Outpatient Primary MD for the patient is Soyla Dryer, PA-C  LOS - 2   Chief Complaint  Patient presents with   Wound Infection        Subjective:    Adam Long does not have any pain in his Long.  No fevers.  No chest pain or shortness of breath.  Assessment  & Plan :    Principal Problem:   Cellulitis and abscess of lower extremity Active Problems:   HTN (hypertension)   Uncontrolled type 2 diabetes mellitus with complication (HCC)   Mixed hyperlipidemia   Morbid obesity (HCC)   Personal history of noncompliance with medical treatment, presenting hazards to health   Cellulitis of fourth toe of left Long   Brief summary 43 y.o. male past medical history relevant for depression /anxiety, T2DM, HTN, HLD, morbid obesity  admitted on 06/11/2019 with L Long infection & s/p near syncope episode  -On 06/07/2019 patient stepped on a metal tack in his kitchen, it pierced through his sneakers and punctured his lt Long--over the last few days patient has noticed increased swelling, warmth, redness---there is  Desquamation/blistering streaking of the lt Long -   A/p 1)Left Long wound infection/cellulitis---  -WBC is down to 8.7 from 10.6, MRSA PCR negative, okay to de-escalate no further vancomycin, continue Zosyn --Given puncture wound to his neck- with a metal tack in a diabetic patient need to adequately cover for polymicrobial pathology including Pseudomonas--once clinically improved may transition from Zosyn to quinolone --Patient already received tetanus booster -Blood cultures NGTD -Surgical consult from Dr. Arnoldo Morale appreciated -Case reviewed with Dr. Arnoldo Morale and he feels that patient will likely need amputation although patient is  resistant to surgical intervention at this time -Check MRI of his toe to evaluate for underlying osteomyelitis.  If present, will need long-term IV antibiotics.  He is high risk for amputation   2)severe aortic stenosis--- evaluated by cardiology, and since he does not have any symptoms, further work-up to be done as an outpatient  3)HFpEF--echo from 06/12/2019 with EF of 74% with diastolic dysfunction -Patient appears euvolemic/compensated at this time  4)DM2-last A1c was 9.6, patient has uncontrolled diabetes and is noncompliant--- continue Tradjenta and metformin and  Use Novolog/Humalog Sliding scale insulin with Accu-Cheks/Fingersticks as ordered   5) morbid obesity--this complicates overall care, BMI over 54, lifestyle and dietary modifications discussed   6)history of depression and anxiety--c/n trazodone 100 mg nightly  7) hyponatremia.  Related to volume depletion.  Improved with IV fluids.   Disposition/Need for in-Hospital Stay- patient unable to be discharged at this time due to infected left Long and left fourth toe wound requiring IV antibiotics.  May need long-term IV antibiotics based on MRI findings  Code Status : Full  Family Communication:   NA (patient is alert, awake and coherent)   Disposition Plan  : pending MRI of Long to evaluate for osteomyelitis  Consults  : General surgery and cardiology  DVT Prophylaxis  :   Heparin - SCDs  Lab Results  Component Value Date   PLT 201 06/12/2019  Inpatient Medications  Scheduled Meds:  atorvastatin  20 mg Oral Daily   heparin  5,000 Units Subcutaneous Q8H   insulin aspart  0-5 Units Subcutaneous QHS   insulin aspart  0-9 Units Subcutaneous TID WC   linagliptin  5 mg Oral Daily   And   metFORMIN  500 mg Oral BID WC   sodium chloride flush  3 mL Intravenous Q12H   traZODone  100 mg Oral QHS   Continuous Infusions:  sodium chloride     sodium chloride 75 mL/hr at 06/13/19 1332   piperacillin-tazobactam  (ZOSYN)  IV 3.375 g (06/13/19 1332)   PRN Meds:.sodium chloride, acetaminophen **OR** acetaminophen, albuterol, hydrALAZINE, ondansetron **OR** ondansetron (ZOFRAN) IV, polyethylene glycol, sodium chloride flush    Anti-infectives (From admission, onward)    Start     Dose/Rate Route Frequency Ordered Stop   06/11/19 2200  piperacillin-tazobactam (ZOSYN) IVPB 3.375 g     3.375 g 12.5 mL/hr over 240 Minutes Intravenous Every 8 hours 06/11/19 1538     06/11/19 1315  vancomycin (VANCOCIN) IVPB 1000 mg/200 mL premix     1,000 mg 200 mL/hr over 60 Minutes Intravenous  Once 06/11/19 1314 06/11/19 1451   06/11/19 1315  piperacillin-tazobactam (ZOSYN) IVPB 3.375 g     3.375 g 100 mL/hr over 30 Minutes Intravenous  Once 06/11/19 1314 06/11/19 1358         Objective:   Vitals:   06/12/19 1300 06/12/19 2116 06/13/19 0535 06/13/19 1430  BP: 122/74 131/88 130/85 (!) 147/91  Pulse: 92 94 92 98  Resp: 20 17 18 20   Temp: 98.4 F (36.9 C) 98.7 F (37.1 C) 98 F (36.7 C) 97.9 F (36.6 C)  TempSrc: Oral Oral Oral Oral  SpO2: 98% 97% 100% 100%  Weight:      Height:        Wt Readings from Last 3 Encounters:  06/11/19 (!) 171.4 kg  09/12/18 (!) 170.1 kg  03/16/18 (!) 181.4 kg     Intake/Output Summary (Last 24 hours) at 06/13/2019 2004 Last data filed at 06/13/2019 0535 Gross per 24 hour  Intake 1682.97 ml  Output --  Net 1682.97 ml     Physical Exam  General exam: Alert, awake, oriented x 3 Respiratory system: Clear to auscultation. Respiratory effort normal. Cardiovascular system:RRR. No murmurs, rubs, gallops. Gastrointestinal system: Abdomen is nondistended, soft and nontender. No organomegaly or masses felt. Normal bowel sounds heard. Central nervous system: Alert and oriented. No focal neurological deficits. Extremities: No C/C/E, +pedal pulses Skin: gangrenous changes in 4th toe of left Long Psychiatry: Judgement and insight appear normal. Mood & affect appropriate.      Data Review:   Micro Results Recent Results (from the past 240 hour(s))  Culture, blood (Routine x 2)     Status: None (Preliminary result)   Collection Time: 06/11/19  1:05 PM   Specimen: BLOOD RIGHT FOREARM  Result Value Ref Range Status   Specimen Description   Final    BLOOD RIGHT FOREARM BOTTLES DRAWN AEROBIC AND ANAEROBIC   Special Requests Blood Culture adequate volume  Final   Culture   Final    NO GROWTH 2 DAYS Performed at [facility], [address] [place], [place] [address]    Report Status PENDING  Incomplete  Culture, blood (Routine x 2)     Status: None (Preliminary result)   Collection Time: 06/11/19  1:10 PM   Specimen: Right Antecubital; Blood  Result Value Ref Range Status  Specimen Description   Final    RIGHT ANTECUBITAL BOTTLES DRAWN AEROBIC AND ANAEROBIC   Special Requests Blood Culture adequate volume  Final   Culture   Final    NO GROWTH 2 DAYS Performed at [facility], [address], [place], [place] [address]    Report Status PENDING  Incomplete  SARS Coronavirus 2 [facility] order, Performed in [facility] hospital lab) Nasopharyngeal Urine, Clean Catch     Status: None   Collection Time: 06/11/19  2:30 PM   Specimen: Urine, Clean Catch; Nasopharyngeal  Result Value Ref Range Status   SARS Coronavirus 2 NEGATIVE NEGATIVE Final    Comment: (NOTE) If result is NEGATIVE SARS-CoV-2 target nucleic acids are NOT DETECTED. The SARS-CoV-2 RNA is generally detectable in upper and lower  respiratory specimens during the acute phase of infection. The lowest  concentration of SARS-CoV-2 viral copies this assay can detect is 250  copies / mL. A negative result does not preclude SARS-CoV-2 infection  and should not be used as the sole basis for treatment or other  patient management decisions.  A negative result may occur with  improper specimen collection / handling, submission of specimen other  than nasopharyngeal swab, presence of viral  mutation(s) within the  areas targeted by this assay, and inadequate number of viral copies  (<250 copies / mL). A negative result must be combined with clinical  observations, patient history, and epidemiological information. If result is POSITIVE SARS-CoV-2 target nucleic acids are DETECTED. The SARS-CoV-2 RNA is generally detectable in upper and lower  respiratory specimens dur ing the acute phase of infection.  Positive  results are indicative of active infection with SARS-CoV-2.  Clinical  correlation with patient history and other diagnostic information is  necessary to determine patient infection status.  Positive results do  not rule out bacterial infection or co-infection with other viruses. If result is PRESUMPTIVE POSTIVE SARS-CoV-2 nucleic acids MAY BE PRESENT.   A presumptive positive result was obtained on the submitted specimen  and confirmed on repeat testing.  While 2019 novel coronavirus  (SARS-CoV-2) nucleic acids may be present in the submitted sample  additional confirmatory testing may be necessary for epidemiological  and / or clinical management purposes  to differentiate between  SARS-CoV-2 and other Sarbecovirus currently known to infect humans.  If clinically indicated additional testing with an alternate test  methodology [id number]) is advised. The SARS-CoV-2 RNA is generally  detectable in upper and lower respiratory sp ecimens during the acute  phase of infection. The expected result is Negative. Fact Sheet for Patients:  BoilerBrush.com.cy Fact Sheet for Healthcare Providers: https://pope.com/ This test is not yet approved or cleared by the [place] FDA and has been authorized for detection and/or diagnosis of SARS-CoV-2 by FDA under an Emergency Use Authorization (EUA).  This EUA will remain in effect (meaning this test can be used) for the duration of the COVID-19 declaration under Section 564(b)(1)  of the Act, 21 U.S.C. section 360bbb-3(b)(1), unless the authorization is terminated or revoked sooner. Performed at [facility], [address] [place], [place] [address]   MRSA PCR Screening     Status: None   Collection Time: 06/11/19 10:19 PM   Specimen: Nasal Mucosa; Nasopharyngeal  Result Value Ref Range Status   MRSA by PCR NEGATIVE NEGATIVE Final    Comment:        The GeneXpert MRSA Assay (FDA approved for NASAL specimens only), is one component of a comprehensive MRSA colonization surveillance program.  It is not intended to diagnose MRSA infection nor to guide or monitor treatment for MRSA infections. Performed at [facility], [address], [place], [place] [address]     Radiology Reports US Carotid Bilateral  Result Date: 06/11/2019 CLINICAL DATA:  43 year old male with a history of syncope EXAM: BILATERAL CAROTID DUPLEX ULTRASOUND TECHNIQUE: Wallace Cullens scale imaging, color Doppler and duplex ultrasound were performed of bilateral carotid and vertebral arteries in the neck. COMPARISON:  None. FINDINGS: Criteria: Quantification of carotid stenosis is based on velocity parameters that correlate the residual internal carotid diameter with NASCET-based stenosis levels, using the diameter of the distal internal carotid lumen as the denominator for stenosis measurement. The following velocity measurements were obtained: RIGHT ICA:  Systolic 67 cm/sec, Diastolic 27 cm/sec CCA:  66 cm/sec SYSTOLIC ICA/CCA RATIO:  1.0 ECA:  61 cm/sec LEFT ICA:  Systolic 98 cm/sec, Diastolic 27 cm/sec CCA:  67 cm/sec SYSTOLIC ICA/CCA RATIO:  1.5 ECA:  70 cm/sec Right Brachial SBP: Not acquired Left Brachial SBP: Not acquired RIGHT CAROTID ARTERY: No significant calcified disease of the right common carotid artery. Intermediate waveform maintained. No significant plaque at the right carotid bifurcation. Low resistance waveform of the right ICA. No significant tortuosity. RIGHT VERTEBRAL ARTERY: Antegrade flow  with low resistance waveform. LEFT CAROTID ARTERY: No significant calcified disease of the left common carotid artery. Intermediate waveform maintained. No significant plaque at the right carotid bifurcation. Low resistance waveform of the left ICA. LEFT VERTEBRAL ARTERY:  Antegrade flow with low resistance waveform. IMPRESSION: Color duplex indicates no significant plaque, with no hemodynamically significant stenosis by duplex criteria in the extracranial cerebrovascular circulation. Signed, Yvone Neu. Reyne Dumas, RPVI [facility] Radiology Electronically Signed   By: Gilmer Mor D.O.   On: 06/11/2019 16:15   US Arterial Seg Multiple Le (abi, Segmental Pressures, Pvr's)  Result Date: 06/12/2019 CLINICAL DATA:  Left fourth toe cellulitis EXAM: NONINVASIVE PHYSIOLOGIC VASCULAR STUDY OF BILATERAL LOWER EXTREMITIES TECHNIQUE: Non-invasive vascular evaluation of both lower extremities was performed at rest, including calculation of ankle-brachial indices, multiple segmental pressure evaluation, segmental Doppler and segmental pulse volume recording. COMPARISON:  None. FINDINGS: Right Lower Extremity Resting ABI: Non calculable due to extensive lower extremity arterial noncompressibility Resting TBI: Elevated likely secondary to arterial noncompressibility Segmental Pressures: Nondiagnostic due to multilevel arterial noncompressibility. Arterial Waveforms: Normal tri-phasic arterial waveforms through the posterior tibial and dorsalis pedis arteries. PVRs: Unremarkable Left Lower Extremity: Resting ABI: Non calculable due to extensive lower extremity arterial noncompressibility Resting TBI: Elevated likely secondary to arterial noncompressibility Segmental Pressures: Nondiagnostic due to multilevel arterial noncompressibility Arterial Waveforms: Monophasic arterial waveforms throughout the left lower extremity PVRs: Mildly attenuated at the calf level of but increased  at the ankle level compared to contralateral side. Other: Symmetric upper extremity pressures. IMPRESSION: Limited evaluation, largely nondiagnostic secondary to incompressible vessel calcifications. Should persistent symptoms warrant further diagnostic evaluation, consider MRA runoff (preferred over CTA in the presence of extensive vascular calcifications). Electronically Signed   By: Corlis Leak M.D.   On: 06/12/2019 17:14   Dg Chest Port 1 View  Result Date: 06/11/2019 CLINICAL DATA:  43 year old male with possible sepsis. EXAM: PORTABLE CHEST 1 VIEW COMPARISON:  Chest radiograph dated 07/02/2013 FINDINGS: The lungs are clear. There is no pleural effusion or pneumothorax. Top-normal cardiac silhouette. No acute osseous pathology. IMPRESSION: No active disease. Electronically Signed   By: Elgie Collard M.D.   On: 06/11/2019 13:51   Dg Long Complete Left  Result  Date: 06/11/2019 CLINICAL DATA:  Left Long redness, pain and swelling since the patient stepped on a tack 06/07/2019. Wound next to the little toe. Initial encounter. EXAM: LEFT Long - COMPLETE 3+ VIEW COMPARISON:  None. FINDINGS: There is no evidence of fracture or dislocation. There is no evidence of arthropathy or other focal bone abnormality. Soft tissues are unremarkable. IMPRESSION: Negative exam. Electronically Signed   By: Drusilla Kannerhomas  Dalessio M.D.   On: 06/11/2019 13:51     CBC Recent Labs  Lab 06/11/19 1305 06/12/19 0443  WBC 10.6* 8.7  HGB 15.0 14.1  HCT 46.1 43.4  PLT 189 201  MCV 90.4 91.8  MCH 29.4 29.8  MCHC 32.5 32.5  RDW 12.8 12.9  LYMPHSABS 1.2  --   MONOABS 0.7  --   EOSABS 0.1  --   BASOSABS 0.1  --     Chemistries  Recent Labs  Lab 06/11/19 1305 06/12/19 0443  NA 131* 135  K 3.8 3.9  CL 97* 101  CO2 19* 25  GLUCOSE 408* 299*  BUN 12 11  CREATININE 0.70 0.67  CALCIUM 8.9 8.6*  AST 114*  --   ALT 85*  --   ALKPHOS 74  --   BILITOT 1.4*  --     ------------------------------------------------------------------------------------------------------------------ No results for input(s): CHOL, HDL, LDLCALC, TRIG, CHOLHDL, LDLDIRECT in the last 72 hours.  Lab Results  Component Value Date   HGBA1C 12.0 (H) 06/11/2019   ------------------------------------------------------------------------------------------------------------------ No results for input(s): TSH, T4TOTAL, T3FREE, THYROIDAB in the last 72 hours.  Invalid input(s): FREET3 ------------------------------------------------------------------------------------------------------------------ No results for input(s): VITAMINB12, FOLATE, FERRITIN, TIBC, IRON, RETICCTPCT in the last 72 hours.  Coagulation profile Recent Labs  Lab 06/11/19 1305  INR 1.1    No results for input(s): DDIMER in the last 72 hours.  Cardiac Enzymes No results for input(s): CKMB, TROPONINI, MYOGLOBIN in the last 168 hours.  Invalid input(s): CK ------------------------------------------------------------------------------------------------------------------ No results found for: BNP   Erick BlinksJehanzeb Viktorya Arguijo M.D on 06/13/2019 at 8:04 PM  Go to [email or web address] - for contact info  [facility] - Office  [phone]

## 2019-06-14 LAB — BASIC METABOLIC PANEL
Anion gap: 9 (ref 5–15)
BUN: 7 mg/dL (ref 6–20)
CO2: 26 mmol/L (ref 22–32)
Calcium: 8.8 mg/dL — ABNORMAL LOW (ref 8.9–10.3)
Chloride: 100 mmol/L (ref 98–111)
Creatinine, Ser: 0.62 mg/dL (ref 0.61–1.24)
GFR calc Af Amer: >60 mL/min (ref >60)
GFR calc non Af Amer: >60 mL/min (ref >60)
Glucose, Bld: 246 mg/dL — ABNORMAL HIGH (ref 70–99)
Potassium: 3.8 mmol/L (ref 3.5–5.1)
Sodium: 135 mmol/L (ref 135–145)

## 2019-06-14 LAB — CBC
HCT: 44.4 % (ref 39.0–52.0)
Hemoglobin: 14.6 g/dL (ref 13.0–17.0)
MCH: 30 pg (ref 26.0–34.0)
MCHC: 32.9 g/dL (ref 30.0–36.0)
MCV: 91.2 fL (ref 80.0–100.0)
Platelets: 227 10*3/uL (ref 150–400)
RBC: 4.87 MIL/uL (ref 4.22–5.81)
RDW: 12.7 % (ref 11.5–15.5)
WBC: 9.4 10*3/uL (ref 4.0–10.5)
nRBC: 0 % (ref 0.0–0.2)

## 2019-06-14 LAB — GLUCOSE, CAPILLARY
Glucose-Capillary: 241 mg/dL — ABNORMAL HIGH (ref 70–99)
Glucose-Capillary: 260 mg/dL — ABNORMAL HIGH (ref 70–99)

## 2019-06-14 MED ORDER — "INSULIN SYRINGE-NEEDLE U-100 31G X 5/16"" 1 ML MISC": 0 refills | Status: DC

## 2019-06-14 MED ORDER — RELION SHORT PEN NEEDLES 31G X 8 MM MISC: 0 refills | Status: DC

## 2019-06-14 MED ORDER — AMOXICILLIN-POT CLAVULANATE 875-125 MG PO TABS: 1.0000 | ORAL_TABLET | Freq: Two times a day (BID) | ORAL | 0 refills | Status: AC

## 2019-06-14 MED ORDER — SULFAMETHOXAZOLE-TRIMETHOPRIM 800-160 MG PO TABS: 2.0000 | ORAL_TABLET | Freq: Two times a day (BID) | ORAL | 0 refills | Status: DC

## 2019-06-14 MED ORDER — INSULIN ASPART PROT & ASPART (70-30 MIX) 100 UNIT/ML ~~LOC~~ SUSP
10.0000 [IU] | Freq: Two times a day (BID) | SUBCUTANEOUS | Status: DC
  Filled 2019-06-14: qty 10

## 2019-06-14 MED ORDER — INSULIN NPH ISOPHANE & REGULAR (70-30) 100 UNIT/ML ~~LOC~~ SUSP: 10.0000 [IU] | Freq: Two times a day (BID) | SUBCUTANEOUS | 11 refills | Status: DC

## 2019-06-14 NOTE — Progress Notes (Signed)
Results for Adam Long, Adam Long (MRN [id number]) as of 06/14/2019 12:44  Ref. Range 06/13/2019 11:09 06/13/2019 16:32 06/13/2019 21:00 06/14/2019 07:43 06/14/2019 11:02  Glucose-Capillary Latest Ref Range: 70 - 99 mg/dL 305 (H) 224 (H) 278 (H) 241 (H) 260 (H)  Noted that blood sugars continue to be greater than 200 mg/dl.  Recommend increasing Novolog correction scale to MODERATE TID & HS if blood sugars continue to be elevated.   Harvel Ricks RN BSN CDE Diabetes Coordinator Pager: [phone]  8am-5pm

## 2019-06-14 NOTE — Progress Notes (Signed)
Nsg Discharge Note  Admit Date:  06/11/2019 Discharge date: 06/14/2019   Adam Long to be D/C'd Home per MD order.  AVS completed.  Copy for chart, and copy for patient signed, and dated. Removed IVs-clean, dry, intact. Reviewed d/c paperwork with patient. Instructed how to change dressing and sent supplies. Answered all questions. Wheeled stable patient and belongings to main entrance where he was picked up by his aunt to d/c to home. Patient/caregiver able to verbalize understanding.  Discharge Medication: Allergies as of 06/14/2019      Reactions   Pollen Extract Other (See Comments)   Runny nose, watery eyes, sneezing      Medication List    TAKE these medications   amoxicillin-clavulanate 875-125 MG tablet Commonly known as: Augmentin Take 1 tablet by mouth 2 (two) times daily for 14 days.   atorvastatin 20 MG tablet Commonly known as: LIPITOR Take 1 tablet (20 mg total) by mouth daily.   insulin NPH-regular Human (70-30) 100 UNIT/ML injection Inject 10 Units into the skin 2 (two) times daily with a meal.   Insulin Syringe-Needle U-100 31G X 5/16" 1 ML Misc Commonly known as: RELION INSULIN SYRINGE 1ML/31G Use as directed   lisinopril 20 MG tablet Commonly known as: ZESTRIL TAKE 1 Tablet BY MOUTH ONCE DAILY FOR BLOOD PRESSURE   metoprolol tartrate 50 MG tablet Commonly known as: LOPRESSOR Take 1 tablet (50 mg total) by mouth 2 (two) times daily.   sitaGLIPtin-metformin 50-500 MG tablet Commonly known as: JANUMET Take 1 tablet by mouth 2 (two) times daily with a meal.   sulfamethoxazole-trimethoprim 800-160 MG tablet Commonly known as: BACTRIM DS Take 2 tablets by mouth 2 (two) times daily.       Discharge Assessment: Vitals:   06/14/19 0515 06/14/19 1432  BP: (!) 147/104 (!) 133/94  Pulse: 96 98  Resp: 18 20  Temp: 98.2 F (36.8 C) 98.5 F (36.9 C)  SpO2: 96% 94%   Skin clean, dry and intact without evidence of skin break down, no evidence of skin  tears noted. IV catheter discontinued intact. Site without signs and symptoms of complications - no redness or edema noted at insertion site, patient denies c/o pain - only slight tenderness at site.  Dressing with slight pressure applied.  D/c Instructions-Education: Discharge instructions given to patient/family with verbalized understanding. D/c education completed with patient/family including follow up instructions, medication list, d/c activities limitations if indicated, with other d/c instructions as indicated by MD - patient able to verbalize understanding, all questions fully answered. Patient instructed to return to ED, call 911, or call MD for any changes in condition.  Patient escorted via [place], and D/C home via private auto.  Santa Lighter, RN 06/14/2019 3:41 PM

## 2019-06-14 NOTE — Plan of Care (Signed)
  Problem: Education: Goal: Knowledge of General Education information will improve Description Including pain rating scale, medication(s)/side effects and non-pharmacologic comfort measures Outcome: Progressing   Problem: Health Behavior/Discharge Planning: Goal: Ability to manage health-related needs will improve Outcome: Progressing   

## 2019-06-14 NOTE — Progress Notes (Signed)
Pharmacy Antibiotic Note  Adam Long is a 43 y.o. male admitted on 06/11/2019 with wound infection.  Pharmacy has been consulted for Zosyn dosing.  Plan: Continue Zosyn 3.375g IV every 8 hours. Monitor labs, c/s, and patient improvement.  Height: 5\' 11"  (180.3 cm) Weight: (!) 377 lb 13.9 oz (171.4 kg) IBW/kg (Calculated) : 75.3  Temp (24hrs), Avg:97.9 F (36.6 C), Min:97.6 F (36.4 C), Max:98.2 F (36.8 C)  Recent Labs  Lab 06/11/19 1305 06/11/19 1455 06/12/19 0443 06/14/19 0518  WBC 10.6*  --  8.7 9.4  CREATININE 0.70  --  0.67 0.62  LATICACIDVEN 1.9 1.7  --   --     Estimated Creatinine Clearance: 191.5 mL/min (by C-G formula based on SCr of 0.62 mg/dL).    Allergies  Allergen Reactions  . Pollen Extract Other (See Comments)    Runny nose, watery eyes, sneezing    Antimicrobials this admission: Zosyn 9/14 >>  Vanco x 1 9/14  Dose adjustments this admission: N/A  Microbiology results: 9/14 BCx: ngtd 9/14 MRSA PCR: neg  Thank you for allowing pharmacy to be a part of this patient's care.  Ramond Craver 06/14/2019 11:04 AM

## 2019-06-14 NOTE — Discharge Summary (Signed)
Physician Discharge Summary  Adam Long ZOX:[id number] DOB: September 12, 1976 DOA: 06/11/2019  PCP: [facility]  Admit date: 06/11/2019 Discharge date: 06/14/2019  Admitted From: home Disposition: Home  Recommendations for Outpatient Follow-up:  1. Follow-up with general surgery in 2 weeks.  If the patient has worsening of his infection and needs to return to the Long, he has been advised to [facility] for further management, since he would be too high risk to undergo surgery at [facility] due to his cardiac status.  Patient is high risk for worsening infection. 2. Patient will follow-up with cardiology for further work-up of his severe aortic stenosis  Home Long: Home health RN Equipment/Devices:  Discharge Condition: Stable CODE STATUS: Full code Diet recommendation: Heart healthy, carb modified  Brief/Interim Summary: 43 year old male with a history of diabetes, hypertension, morbid obesity presented to the emergency room with a left foot infection and near syncope.  Approximately 4 days prior to admission the patient had stepped on a metal tack in his kitchen which appears through his sneakers and punctured his foot.  He had noticed worsening swelling, warmth and redness.  There was desquamation/blistering streaking of the left foot.  He was admitted to the Long and started on intravenous antibiotics for cellulitis and gangrenous changes.  He was seen by general surgery who recommended amputation.  Patient does not wish to undergo amputation at this time despite repeated recommendations of this.  He wishes to continue with antibiotic therapy and give it 1 to 2 weeks.  MRI was performed that did not indicate any underlying osteomyelitis.  After discussion with surgery, he was put on Augmentin and Bactrim.  I suspect he would be high risk for further decompensation.  He has been advised to return to the Long if he has any recurrent fever, worsening  streaking in his foot or generalized worsening of his infection with spreading to other toes/foot.  He was noted to have severe aortic stenosis on echocardiogram.  He was seen by cardiology who will perform outpatient work-up.  If the patient does require surgery for his total, this will need to be performed at [facility] as he would be high risk from a cardiac standpoint  Patient's diabetes was uncontrolled.  He was started on insulin 70/30 in addition to his oral regimen.  Can be further followed up by his primary care physician.  Discharge Diagnoses:  Principal Problem:   Cellulitis and abscess of lower extremity Active Problems:   HTN (hypertension)   Uncontrolled type 2 diabetes mellitus with complication (HCC)   Mixed hyperlipidemia   Morbid obesity (HCC)   Personal history of noncompliance with medical treatment, presenting hazards to Long   Cellulitis of fourth toe of left foot    Discharge Instructions  Discharge Instructions    Diet - low sodium heart healthy   Complete by: As directed    Increase activity slowly   Complete by: As directed      Allergies as of 06/14/2019      Reactions   Pollen Extract Other (See Comments)   Runny nose, watery eyes, sneezing      Medication List    TAKE these medications   amoxicillin-clavulanate 875-125 MG tablet Commonly known as: Augmentin Take 1 tablet by mouth 2 (two) times daily for 14 days.   atorvastatin 20 MG tablet Commonly known as: LIPITOR Take 1 tablet (20 mg total) by mouth daily.   insulin NPH-regular Human (70-30) 100 UNIT/ML injection  Inject 10 Units into the skin 2 (two) times daily with a meal.   Insulin Syringe-Needle U-100 31G X 5/16" 1 ML Misc Commonly known as: RELION INSULIN SYRINGE 1ML/31G Use as directed   lisinopril 20 MG tablet Commonly known as: ZESTRIL TAKE 1 Tablet BY MOUTH ONCE DAILY FOR BLOOD PRESSURE   metoprolol tartrate 50 MG tablet Commonly known as: LOPRESSOR Take 1  tablet (50 mg total) by mouth 2 (two) times daily.   sitaGLIPtin-metformin 50-500 MG tablet Commonly known as: JANUMET Take 1 tablet by mouth 2 (two) times daily with a meal.   sulfamethoxazole-trimethoprim 800-160 MG tablet Commonly known as: BACTRIM DS Take 2 tablets by mouth 2 (two) times daily.      Follow-up Information    [facility]. Schedule an appointment as soon as possible for a visit in 2 week(s).   Specialty: General Surgery Contact information: [address] [place] [address] [phone]        [facility] Follow up on 07/10/2019.   Specialties: Physician Assistant, Cardiology Why: Cardiology Long Follow-up on 07/10/2019 at 2:30 PM.  Contact information: [address] [place] [address] [phone]        [facility]. Schedule an appointment as soon as possible for a visit in 2 week(s).   Specialty: Physician Assistant Contact information: [address] [place] [address] [phone]        [facility].   Specialty: Home Long Services Why: home health RN Contact information: [address] [place] [address] [phone]          Allergies  Allergen Reactions  . Pollen Extract Other (See Comments)    Runny nose, watery eyes, sneezing    Consultations:  General surgery   Procedures/Studies: Mr Foot Left Wo Contrast  Result Date: 06/14/2019 CLINICAL DATA:  Left foot redness and swelling several days after fourth toe puncture wound. Stepped on a tack. EXAM: MRI OF THE LEFT FOOT WITHOUT CONTRAST TECHNIQUE: Multiplanar, multisequence MR imaging of the left forefoot was performed. No intravenous contrast was administered. COMPARISON:  Left foot x-rays dated June 11, 2019. FINDINGS: Bones/Joint/Cartilage No marrow signal abnormality. No fracture or dislocation. Normal alignment. No joint effusion. Ligaments Collateral ligaments are intact.  Muscles and Tendons Flexor and extensor compartment tendons are intact. Increased T2 signal within the intrinsic muscles of the forefoot, nonspecific, but likely related to diabetic muscle changes. Soft tissue Plantar puncture wound at the level of the fourth PIP joint. Extensive soft tissue swelling of the dorsal forefoot and base of the toes with skin blistering of the base of the fourth toe. 7 x 5 x 12 mm fluid collection in between the proximal third and fourth metatarsals. No soft tissue mass. IMPRESSION: 1. Plantar puncture wound at the level of the fourth PIP joint complicated by extensive cellulitis of the forefoot. 2. Small 12 mm fluid collection in between the proximal third and fourth metatarsals. Although this could be an abscess, its location is deep within the intrinsic muscles of the forefoot, not near the puncture wound, and it could therefore be a small ganglion cyst. 3. No osteomyelitis. Electronically Signed   By: Obie DredgeWilliam T Derry M.D.   On: 06/14/2019 07:47   [place] Carotid Bilateral  Result Date: 06/11/2019 CLINICAL DATA:  43 year old male with a history of syncope EXAM: BILATERAL CAROTID DUPLEX ULTRASOUND TECHNIQUE: Wallace CullensGray scale imaging, color Doppler and duplex ultrasound were performed of bilateral carotid and vertebral arteries  in the neck. COMPARISON:  None. FINDINGS: Criteria: Quantification of carotid stenosis is based on velocity parameters that correlate the residual internal carotid diameter with NASCET-based stenosis levels, using the diameter of the distal internal carotid lumen as the denominator for stenosis measurement. The following velocity measurements were obtained: RIGHT ICA:  Systolic 67 cm/sec, Diastolic 27 cm/sec CCA:  66 cm/sec SYSTOLIC ICA/CCA RATIO:  1.0 ECA:  61 cm/sec LEFT ICA:  Systolic 98 cm/sec, Diastolic 27 cm/sec CCA:  67 cm/sec SYSTOLIC ICA/CCA RATIO:  1.5 ECA:  70 cm/sec Right Brachial SBP: Not acquired Left Brachial SBP: Not acquired RIGHT CAROTID ARTERY: No  significant calcified disease of the right common carotid artery. Intermediate waveform maintained. No significant plaque at the right carotid bifurcation. Low resistance waveform of the right ICA. No significant tortuosity. RIGHT VERTEBRAL ARTERY: Antegrade flow with low resistance waveform. LEFT CAROTID ARTERY: No significant calcified disease of the left common carotid artery. Intermediate waveform maintained. No significant plaque at the right carotid bifurcation. Low resistance waveform of the left ICA. LEFT VERTEBRAL ARTERY:  Antegrade flow with low resistance waveform. IMPRESSION: Color duplex indicates no significant plaque, with no hemodynamically significant stenosis by duplex criteria in the extracranial cerebrovascular circulation. Signed, Dulcy Fanny. Dellia Nims, RPVI [facility] Electronically Signed   By: Corrie Mckusick D.O.   On: 06/11/2019 16:15   US Arterial Seg Multiple Le (abi, Segmental Pressures, Pvr's)  Result Date: 06/12/2019 CLINICAL DATA:  Left fourth toe cellulitis EXAM: NONINVASIVE PHYSIOLOGIC VASCULAR STUDY OF BILATERAL LOWER EXTREMITIES TECHNIQUE: Non-invasive vascular evaluation of both lower extremities was performed at rest, including calculation of ankle-brachial indices, multiple segmental pressure evaluation, segmental Doppler and segmental pulse volume recording. COMPARISON:  None. FINDINGS: Right Lower Extremity Resting ABI: Non calculable due to extensive lower extremity arterial noncompressibility Resting TBI: Elevated likely secondary to arterial noncompressibility Segmental Pressures: Nondiagnostic due to multilevel arterial noncompressibility. Arterial Waveforms: Normal tri-phasic arterial waveforms through the posterior tibial and dorsalis pedis arteries. PVRs: Unremarkable Left Lower Extremity: Resting ABI: Non calculable due to extensive lower extremity arterial noncompressibility Resting TBI: Elevated likely  secondary to arterial noncompressibility Segmental Pressures: Nondiagnostic due to multilevel arterial noncompressibility Arterial Waveforms: Monophasic arterial waveforms throughout the left lower extremity PVRs: Mildly attenuated at the calf level of but increased at the ankle level compared to contralateral side. Other: Symmetric upper extremity pressures. IMPRESSION: Limited evaluation, largely nondiagnostic secondary to incompressible vessel calcifications. Should persistent symptoms warrant further diagnostic evaluation, consider MRA runoff (preferred over CTA in the presence of extensive vascular calcifications). Electronically Signed   By: Lucrezia Europe M.D.   On: 06/12/2019 17:14   Dg Chest Port 1 View  Result Date: 06/11/2019 CLINICAL DATA:  43 year old male with possible sepsis. EXAM: PORTABLE CHEST 1 VIEW COMPARISON:  Chest radiograph dated 07/02/2013 FINDINGS: The lungs are clear. There is no pleural effusion or pneumothorax. Top-normal cardiac silhouette. No acute osseous pathology. IMPRESSION: No active disease. Electronically Signed   By: Anner Crete M.D.   On: 06/11/2019 13:51   Dg Foot Complete Left  Result Date: 06/11/2019 CLINICAL DATA:  Left foot redness, pain and swelling since the patient stepped on a tack 06/07/2019. Wound next to the little toe. Initial encounter. EXAM: LEFT FOOT - COMPLETE 3+ VIEW COMPARISON:  None. FINDINGS: There is no evidence of fracture or dislocation. There is no evidence of arthropathy or other focal bone abnormality. Soft tissues are unremarkable. IMPRESSION: Negative exam. Electronically Signed   By: Inge Rise M.D.  On: 06/11/2019 13:51       Subjective: Does not have any pain in his foot.  Wants to go home.  Discharge Exam: Vitals:   06/13/19 1430 06/13/19 2059 06/14/19 0515 06/14/19 1432  BP: (!) 147/91 (!) 145/99 (!) 147/104 (!) 133/94  Pulse: 98 (!) 105 96 98  Resp: 20 18 18 20   Temp: 97.9 F (36.6 C) 97.6 F (36.4 C) 98.2 F  (36.8 C) 98.5 F (36.9 C)  TempSrc: Oral Oral Oral   SpO2: 100% 91% 96% 94%  Weight:      Height:        General: Pt is alert, awake, not in acute distress Cardiovascular: RRR, S1/S2 +, no rubs, no gallops Respiratory: CTA bilaterally, no wheezing, no rhonchi Abdominal: Soft, NT, ND, bowel sounds + Extremities: Significant skin breakdown and cellulitis on fourth left toe    The results of significant diagnostics from this hospitalization (including imaging, microbiology, ancillary and laboratory) are listed below for reference.     Microbiology: Recent Results (from the past 240 hour(s))  Culture, blood (Routine x 2)     Status: None (Preliminary result)   Collection Time: 06/11/19  1:05 PM   Specimen: BLOOD RIGHT FOREARM  Result Value Ref Range Status   Specimen Description   Final    BLOOD RIGHT FOREARM BOTTLES DRAWN AEROBIC AND ANAEROBIC   Special Requests Blood Culture adequate volume  Final   Culture   Final    NO GROWTH 3 DAYS Performed at [facility], [address] [place], [place] [address]    Report Status PENDING  Incomplete  Culture, blood (Routine x 2)     Status: None (Preliminary result)   Collection Time: 06/11/19  1:10 PM   Specimen: Right Antecubital; Blood  Result Value Ref Range Status   Specimen Description   Final    RIGHT ANTECUBITAL BOTTLES DRAWN AEROBIC AND ANAEROBIC   Special Requests Blood Culture adequate volume  Final   Culture   Final    NO GROWTH 3 DAYS Performed at [facility], [address], [place], [place] [address]    Report Status PENDING  Incomplete  SARS Coronavirus 2 [facility] order, Performed in [facility] lab) Nasopharyngeal Urine, Clean Catch     Status: None   Collection Time: 06/11/19  2:30 PM   Specimen: Urine, Clean Catch; Nasopharyngeal  Result Value Ref Range Status   SARS Coronavirus 2 NEGATIVE NEGATIVE Final    Comment: (NOTE) If result is NEGATIVE SARS-CoV-2 target nucleic acids are NOT DETECTED. The  SARS-CoV-2 RNA is generally detectable in upper and lower  respiratory specimens during the acute phase of infection. The lowest  concentration of SARS-CoV-2 viral copies this assay can detect is 250  copies / mL. A negative result does not preclude SARS-CoV-2 infection  and should not be used as the sole basis for treatment or other  patient management decisions.  A negative result may occur with  improper specimen collection / handling, submission of specimen other  than nasopharyngeal swab, presence of viral mutation(s) within the  areas targeted by this assay, and inadequate number of viral copies  (<250 copies / mL). A negative result must be combined with clinical  observations, patient history, and epidemiological information. If result is POSITIVE SARS-CoV-2 target nucleic acids are DETECTED. The SARS-CoV-2 RNA is generally detectable in upper and lower  respiratory specimens dur ing the acute phase of infection.  Positive  results are indicative of active infection with SARS-CoV-2.  Clinical  correlation with patient history and other diagnostic information is  necessary to determine patient infection status.  Positive results do  not rule out bacterial infection or co-infection with other viruses. If result is PRESUMPTIVE POSTIVE SARS-CoV-2 nucleic acids MAY BE PRESENT.   A presumptive positive result was obtained on the submitted specimen  and confirmed on repeat testing.  While 2019 novel coronavirus  (SARS-CoV-2) nucleic acids may be present in the submitted sample  additional confirmatory testing may be necessary for epidemiological  and / or clinical management purposes  to differentiate between  SARS-CoV-2 and other Sarbecovirus currently known to infect humans.  If clinically indicated additional testing with an alternate test  methodology [phone]) is advised. The SARS-CoV-2 RNA is generally  detectable in upper and lower respiratory sp ecimens during the acute   phase of infection. The expected result is Negative. Fact Sheet for Patients:  BoilerBrush.com.cy Fact Sheet for Healthcare Providers: https://pope.com/ This test is not yet approved or cleared by the [place] FDA and has been authorized for detection and/or diagnosis of SARS-CoV-2 by FDA under an Emergency Use Authorization (EUA).  This EUA will remain in effect (meaning this test can be used) for the duration of the COVID-19 declaration under Section 564(b)(1) of the Act, 21 U.S.C. section 360bbb-3(b)(1), unless the authorization is terminated or revoked sooner. Performed at [facility], [address], [place], [place] [address]   MRSA PCR Screening     Status: None   Collection Time: 06/11/19 10:19 PM   Specimen: Nasal Mucosa; Nasopharyngeal  Result Value Ref Range Status   MRSA by PCR NEGATIVE NEGATIVE Final    Comment:        The GeneXpert MRSA Assay (FDA approved for NASAL specimens only), is one component of a comprehensive MRSA colonization surveillance program. It is not intended to diagnose MRSA infection nor to guide or monitor treatment for MRSA infections. Performed at [facility], [address] [place], [place] [address]      Labs: BNP (last 3 results) No results for input(s): BNP in the last 8760 hours. Basic Metabolic Panel: Recent Labs  Lab 06/11/19 1305 06/12/19 0443 06/14/19 0518  NA 131* 135 135  K 3.8 3.9 3.8  CL 97* 101 100  CO2 19* 25 26  GLUCOSE 408* 299* 246*  BUN 12 11 7   CREATININE 0.70 0.67 0.62  CALCIUM 8.9 8.6* 8.8*   Liver Function Tests: Recent Labs  Lab 06/11/19 1305  AST 114*  ALT 85*  ALKPHOS 74  BILITOT 1.4*  PROT 7.1  ALBUMIN 3.4*   No results for input(s): LIPASE, AMYLASE in the last 168 hours. No results for input(s): AMMONIA in the last 168 hours. CBC: Recent Labs  Lab 06/11/19 1305 06/12/19 0443 06/14/19 0518  WBC 10.6* 8.7 9.4  NEUTROABS 8.4*  --    --   HGB 15.0 14.1 14.6  HCT 46.1 43.4 44.4  MCV 90.4 91.8 91.2  PLT 189 201 227   Cardiac Enzymes: No results for input(s): CKTOTAL, CKMB, CKMBINDEX, TROPONINI in the last 168 hours. BNP: Invalid input(s): POCBNP CBG: Recent Labs  Lab 06/13/19 1109 06/13/19 1632 06/13/19 2100 06/14/19 0743 06/14/19 1102  GLUCAP 305* 224* 278* 241* 260*   D-Dimer No results for input(s): DDIMER in the last 72 hours. Hgb A1c No results for input(s): HGBA1C in the last 72 hours. Lipid Profile No results for input(s): CHOL, HDL, LDLCALC, TRIG, CHOLHDL, LDLDIRECT in the last 72 hours. Thyroid function studies No results  for input(s): TSH, T4TOTAL, T3FREE, THYROIDAB in the last 72 hours.  Invalid input(s): FREET3 Anemia work up No results for input(s): VITAMINB12, FOLATE, FERRITIN, TIBC, IRON, RETICCTPCT in the last 72 hours. Urinalysis    Component Value Date/Time   COLORURINE YELLOW 06/11/2019 1305   APPEARANCEUR HAZY (A) 06/11/2019 1305   LABSPEC 1.022 06/11/2019 1305   PHURINE 6.0 06/11/2019 1305   GLUCOSEU >=500 (A) 06/11/2019 1305   HGBUR NEGATIVE 06/11/2019 1305   BILIRUBINUR NEGATIVE 06/11/2019 1305   KETONESUR 20 (A) 06/11/2019 1305   PROTEINUR 100 (A) 06/11/2019 1305   UROBILINOGEN 0.2 08/31/2011 1727   NITRITE NEGATIVE 06/11/2019 1305   LEUKOCYTESUR SMALL (A) 06/11/2019 1305   Sepsis Labs Invalid input(s): PROCALCITONIN,  WBC,  LACTICIDVEN Microbiology Recent Results (from the past 240 hour(s))  Culture, blood (Routine x 2)     Status: None (Preliminary result)   Collection Time: 06/11/19  1:05 PM   Specimen: BLOOD RIGHT FOREARM  Result Value Ref Range Status   Specimen Description   Final    BLOOD RIGHT FOREARM BOTTLES DRAWN AEROBIC AND ANAEROBIC   Special Requests Blood Culture adequate volume  Final   Culture   Final    NO GROWTH 3 DAYS Performed at [facility], [address], [place], [place] [address]    Report Status PENDING  Incomplete  Culture, blood  (Routine x 2)     Status: None (Preliminary result)   Collection Time: 06/11/19  1:10 PM   Specimen: Right Antecubital; Blood  Result Value Ref Range Status   Specimen Description   Final    RIGHT ANTECUBITAL BOTTLES DRAWN AEROBIC AND ANAEROBIC   Special Requests Blood Culture adequate volume  Final   Culture   Final    NO GROWTH 3 DAYS Performed at [facility], [address] [place], [place] [address]    Report Status PENDING  Incomplete  SARS Coronavirus 2 [facility] order, Performed in [facility] lab) Nasopharyngeal Urine, Clean Catch     Status: None   Collection Time: 06/11/19  2:30 PM   Specimen: Urine, Clean Catch; Nasopharyngeal  Result Value Ref Range Status   SARS Coronavirus 2 NEGATIVE NEGATIVE Final    Comment: (NOTE) If result is NEGATIVE SARS-CoV-2 target nucleic acids are NOT DETECTED. The SARS-CoV-2 RNA is generally detectable in upper and lower  respiratory specimens during the acute phase of infection. The lowest  concentration of SARS-CoV-2 viral copies this assay can detect is 250  copies / mL. A negative result does not preclude SARS-CoV-2 infection  and should not be used as the sole basis for treatment or other  patient management decisions.  A negative result may occur with  improper specimen collection / handling, submission of specimen other  than nasopharyngeal swab, presence of viral mutation(s) within the  areas targeted by this assay, and inadequate number of viral copies  (<250 copies / mL). A negative result must be combined with clinical  observations, patient history, and epidemiological information. If result is POSITIVE SARS-CoV-2 target nucleic acids are DETECTED. The SARS-CoV-2 RNA is generally detectable in upper and lower  respiratory specimens dur ing the acute phase of infection.  Positive  results are indicative of active infection with SARS-CoV-2.  Clinical  correlation with patient history and other diagnostic information is   necessary to determine patient infection status.  Positive results do  not rule out bacterial infection or co-infection with other viruses. If result is PRESUMPTIVE POSTIVE SARS-CoV-2 nucleic acids MAY BE PRESENT.  A presumptive positive result was obtained on the submitted specimen  and confirmed on repeat testing.  While 2019 novel coronavirus  (SARS-CoV-2) nucleic acids may be present in the submitted sample  additional confirmatory testing may be necessary for epidemiological  and / or clinical management purposes  to differentiate between  SARS-CoV-2 and other Sarbecovirus currently known to infect humans.  If clinically indicated additional testing with an alternate test  methodology [phone]) is advised. The SARS-CoV-2 RNA is generally  detectable in upper and lower respiratory sp ecimens during the acute  phase of infection. The expected result is Negative. Fact Sheet for Patients:  BoilerBrush.com.cy Fact Sheet for Healthcare Providers: https://pope.com/ This test is not yet approved or cleared by the [place] FDA and has been authorized for detection and/or diagnosis of SARS-CoV-2 by FDA under an Emergency Use Authorization (EUA).  This EUA will remain in effect (meaning this test can be used) for the duration of the COVID-19 declaration under Section 564(b)(1) of the Act, 21 U.S.C. section 360bbb-3(b)(1), unless the authorization is terminated or revoked sooner. Performed at [facility], [address] [place], [place] [address]   MRSA PCR Screening     Status: None   Collection Time: 06/11/19 10:19 PM   Specimen: Nasal Mucosa; Nasopharyngeal  Result Value Ref Range Status   MRSA by PCR NEGATIVE NEGATIVE Final    Comment:        The GeneXpert MRSA Assay (FDA approved for NASAL specimens only), is one component of a comprehensive MRSA colonization surveillance program. It is not intended to diagnose  MRSA infection nor to guide or monitor treatment for MRSA infections. Performed at [facility], [address] [place], [place] [address]      Time coordinating discharge:  SIGNED:   Erick Blinks, Long  [facility] 06/14/2019, 8:52 PM   If 7PM-7AM, please contact night-coverage [email or web address]

## 2019-06-14 NOTE — TOC Transition Note (Signed)
Transition of Care [facility]) - CM/SW Discharge Note   Patient Details  Name: Adam Long MRN: [id number] Date of Birth: 1976/02/29  Transition of Care [facility]) CM/SW Contact:  Mumtaz Lovins, Chauncey Reading, RN Phone Number: 06/14/2019, 3:06 PM   Clinical Narrative: Patient discharging home today.  MATCH provided yesterday. Home health RN arranged with  Alvis Lemmings for [facility] home health.       Final next level of care: [place] Barriers to Discharge: No Barriers Identified     HH Arranged: RN HH Agency: [place] Date [place]: 06/14/19 Time [place]: 3220 Representative spoke with at [place]: [place]    Readmission Risk Interventions No flowsheet data found.

## 2019-06-14 NOTE — Plan of Care (Signed)
  Problem: Education: Goal: Knowledge of General Education information will improve Description: Including pain rating scale, medication(s)/side effects and non-pharmacologic comfort measures 06/14/2019 1419 by Santa Lighter, RN Outcome: Adequate for Discharge 06/14/2019 0837 by Santa Lighter, RN Outcome: Progressing   Problem: Health Behavior/Discharge Planning: Goal: Ability to manage health-related needs will improve 06/14/2019 1419 by Santa Lighter, RN Outcome: Adequate for Discharge 06/14/2019 0837 by Santa Lighter, RN Outcome: Progressing   Problem: Clinical Measurements: Goal: Ability to maintain clinical measurements within normal limits will improve Outcome: Adequate for Discharge Goal: Will remain free from infection Outcome: Adequate for Discharge Goal: Diagnostic test results will improve Outcome: Adequate for Discharge Goal: Respiratory complications will improve Outcome: Adequate for Discharge Goal: Cardiovascular complication will be avoided Outcome: Adequate for Discharge   Problem: Activity: Goal: Risk for activity intolerance will decrease Outcome: Adequate for Discharge   Problem: Nutrition: Goal: Adequate nutrition will be maintained Outcome: Adequate for Discharge   Problem: Coping: Goal: Level of anxiety will decrease Outcome: Adequate for Discharge   Problem: Elimination: Goal: Will not experience complications related to bowel motility Outcome: Adequate for Discharge Goal: Will not experience complications related to urinary retention Outcome: Adequate for Discharge   Problem: Pain Managment: Goal: General experience of comfort will improve Outcome: Adequate for Discharge   Problem: Safety: Goal: Ability to remain free from injury will improve Outcome: Adequate for Discharge   Problem: Skin Integrity: Goal: Risk for impaired skin integrity will decrease Outcome: Adequate for Discharge

## 2019-06-16 LAB — CULTURE, BLOOD (ROUTINE X 2)
Culture: NO GROWTH
Culture: NO GROWTH
Special Requests: ADEQUATE
Special Requests: ADEQUATE

## 2019-06-25 ENCOUNTER — Ambulatory Visit: Payer: Self-pay | Admitting: Physician Assistant

## 2019-06-27 ENCOUNTER — Telehealth: Payer: Self-pay

## 2019-06-27 NOTE — Telephone Encounter (Signed)
Pt. Eligibility is 06/27/2019 till 06/26/2020 with [facility]. Did not fax over pt. medassist because we still pt. Application for [facility]. I will make a new note when I have received application and faxed it over.  Adam Long

## 2019-06-28 ENCOUNTER — Encounter: Payer: Self-pay | Admitting: General Surgery

## 2019-06-28 ENCOUNTER — Other Ambulatory Visit: Payer: Self-pay

## 2019-06-28 ENCOUNTER — Telehealth: Payer: Self-pay

## 2019-06-28 ENCOUNTER — Ambulatory Visit (INDEPENDENT_AMBULATORY_CARE_PROVIDER_SITE_OTHER): Payer: Self-pay | Admitting: General Surgery

## 2019-06-28 ENCOUNTER — Other Ambulatory Visit: Payer: Self-pay | Admitting: Physician Assistant

## 2019-06-28 ENCOUNTER — Other Ambulatory Visit (HOSPITAL_COMMUNITY)
Admission: RE | Admit: 2019-06-28 | Discharge: 2019-06-28 | Disposition: A | Discharge status: Home / Self Care | Payer: Self-pay | Source: Ambulatory Visit | Attending: Physician Assistant | Admitting: Physician Assistant

## 2019-06-28 VITALS — BP 106/73 | HR 77 | Temp 97.1°F | Resp 18 | Ht 70.0 in | Wt 368.0 lb

## 2019-06-28 DIAGNOSIS — I96 Gangrene, not elsewhere classified: Secondary | ICD-10-CM

## 2019-06-28 DIAGNOSIS — E1165 Type 2 diabetes mellitus with hyperglycemia: Secondary | ICD-10-CM | POA: Insufficient documentation

## 2019-06-28 DIAGNOSIS — I10 Essential (primary) hypertension: Secondary | ICD-10-CM

## 2019-06-28 DIAGNOSIS — E785 Hyperlipidemia, unspecified: Secondary | ICD-10-CM

## 2019-06-28 DIAGNOSIS — E875 Hyperkalemia: Secondary | ICD-10-CM

## 2019-06-28 DIAGNOSIS — IMO0001 Reserved for inherently not codable concepts without codable children: Secondary | ICD-10-CM

## 2019-06-28 LAB — LIPID PANEL
Cholesterol: 161 mg/dL (ref 0–200)
HDL: 43 mg/dL (ref >40)
LDL Cholesterol: 88 mg/dL (ref 0–99)
Total CHOL/HDL Ratio: 3.7 RATIO
Triglycerides: 148 mg/dL (ref <150)
VLDL: 30 mg/dL (ref 0–40)

## 2019-06-28 LAB — COMPREHENSIVE METABOLIC PANEL
ALT: 50 U/L — ABNORMAL HIGH (ref 0–44)
AST: 35 U/L (ref 15–41)
Albumin: 4 g/dL (ref 3.5–5.0)
Alkaline Phosphatase: 57 U/L (ref 38–126)
Anion gap: 11 (ref 5–15)
BUN: 16 mg/dL (ref 6–20)
CO2: 24 mmol/L (ref 22–32)
Calcium: 9.6 mg/dL (ref 8.9–10.3)
Chloride: 96 mmol/L — ABNORMAL LOW (ref 98–111)
Creatinine, Ser: 0.85 mg/dL (ref 0.61–1.24)
GFR calc Af Amer: >60 mL/min (ref >60)
GFR calc non Af Amer: >60 mL/min (ref >60)
Glucose, Bld: 217 mg/dL — ABNORMAL HIGH (ref 70–99)
Potassium: 5.6 mmol/L — ABNORMAL HIGH (ref 3.5–5.1)
Sodium: 131 mmol/L — ABNORMAL LOW (ref 135–145)
Total Bilirubin: 1.2 mg/dL (ref 0.3–1.2)
Total Protein: 7.8 g/dL (ref 6.5–8.1)

## 2019-06-28 LAB — HEMOGLOBIN A1C
Hgb A1c MFr Bld: 11.5 % — ABNORMAL HIGH (ref 4.8–5.6)
Mean Plasma Glucose: 283.35 mg/dL

## 2019-06-28 NOTE — Telephone Encounter (Signed)
Faxed over pt. medassist application and other docs on 06/28/2019.  Adam Long

## 2019-06-29 LAB — MICROALBUMIN, URINE: Microalb, Ur: 18.4 ug/mL — ABNORMAL HIGH

## 2019-06-29 NOTE — Progress Notes (Signed)
Subjective:     Adam Long  Patient here for follow-up after hospitalization.  He has known cellulitis and early gangrene of the left fourth toe.  He has uncontrolled diabetes mellitus.  He was also diagnosed with a severe aortic stenosis that may require surgery in the near future.  Patient states his blood sugars have been under better control and he is following them closely.  No fevers have been noted.  Has been on oral antibiotics. Objective:    BP 106/73 (BP Location: Left Arm, Patient Position: Sitting, Cuff Size: Large)   Pulse 77   Temp (!) 97.1 F (36.2 C) (Tympanic)   Resp 18   Ht 5\' 10"  (1.778 m)   Wt (!) 368 lb (166.9 kg)   SpO2 97%   BMI 52.80 kg/m   General:  alert, cooperative and no distress  Left foot with obvious necrosis with superficial soft eschars present over the left fourth toe.  The left fourth toe was fully involved.  Minimal left foot swelling is noted.  No bone is exposed at this time.     Assessment:    Progression of cellulitis and gangrene, left fourth toe.  Continues to be localized to the left fourth toe.  Severe aortic stenosis    Plan:   We will make referral to vascular surgery in [facility] for toe amputation as cardiology states he must have the surgery done in [place].  He may need aortic valve replacement in the near future.  Patient understands this and agrees.

## 2019-07-02 ENCOUNTER — Encounter: Payer: Self-pay | Admitting: Physician Assistant

## 2019-07-02 ENCOUNTER — Other Ambulatory Visit: Payer: Self-pay

## 2019-07-02 ENCOUNTER — Other Ambulatory Visit (HOSPITAL_COMMUNITY)
Admission: RE | Admit: 2019-07-02 | Discharge: 2019-07-02 | Disposition: A | Discharge status: Home / Self Care | Payer: Self-pay | Source: Ambulatory Visit | Attending: Physician Assistant | Admitting: Physician Assistant

## 2019-07-02 ENCOUNTER — Ambulatory Visit: Payer: Self-pay | Admitting: Physician Assistant

## 2019-07-02 VITALS — BP 110/68 | HR 73 | Temp 97.9°F

## 2019-07-02 DIAGNOSIS — E1165 Type 2 diabetes mellitus with hyperglycemia: Secondary | ICD-10-CM

## 2019-07-02 DIAGNOSIS — I10 Essential (primary) hypertension: Secondary | ICD-10-CM

## 2019-07-02 DIAGNOSIS — E785 Hyperlipidemia, unspecified: Secondary | ICD-10-CM

## 2019-07-02 DIAGNOSIS — E875 Hyperkalemia: Secondary | ICD-10-CM | POA: Insufficient documentation

## 2019-07-02 DIAGNOSIS — IMO0002 Reserved for concepts with insufficient information to code with codable children: Secondary | ICD-10-CM

## 2019-07-02 DIAGNOSIS — I96 Gangrene, not elsewhere classified: Secondary | ICD-10-CM

## 2019-07-02 DIAGNOSIS — I350 Nonrheumatic aortic (valve) stenosis: Secondary | ICD-10-CM

## 2019-07-02 LAB — POTASSIUM: Potassium: 4.7 mmol/L (ref 3.5–5.1)

## 2019-07-02 MED ORDER — LANTUS SOLOSTAR 100 UNIT/ML ~~LOC~~ SOPN: 10.0000 [IU] | PEN_INJECTOR | Freq: Every day | SUBCUTANEOUS | 99 refills | Status: DC

## 2019-07-02 MED ORDER — NOVOLOG FLEXPEN 100 UNIT/ML ~~LOC~~ SOPN: PEN_INJECTOR | SUBCUTANEOUS | 0 refills | Status: DC

## 2019-07-02 NOTE — Progress Notes (Signed)
There were no vitals taken for this visit.   Subjective:    Patient ID: Adam Long, male    DOB: June 28, 1976, 43 y.o.   MRN: [id number]  HPI: Adam Long is a 43 y.o. male presenting on 07/02/2019 for No chief complaint on file.   HPI   Pt had negative covid 19 screening questionnaire today.      Pt recently returned to office on 06/07/19 to reestablish care after having not come in for one year.  Since that time, he was in hospital with infection in his foot.  There is gangrene and surgery plans to remove the toe.  Pt is scheduled for MRI tomorrow followed by appointment with surgeon.   When pt got his labs drawn, his K+ was elevated.  Pt was sent for recheck of the K+ this morning.  Result of that is pending.    Yesterday fbs 186 in am. Before  Lunch 156.Marland Kitchen  Pt with years and years of uncontrolled diabetes.  He was referred to endocrinologist and was seen.  He missed appointments and just stopped going.    His last visit with endocrinology was 03/16/2018  Pt says he is doing okay.  He is not having any fevers.  He finished all the antibiotics he was given for his toe.    He has no complaints today.     Relevant past medical, surgical, family and social history reviewed and updated as indicated. Interim medical history since our last visit reviewed. Allergies and medications reviewed and updated.    Current Outpatient Medications:  .  atorvastatin (LIPITOR) 20 MG tablet, Take 1 tablet (20 mg total) by mouth daily., Disp: 30 tablet, Rfl: 0 .  insulin NPH-regular Human (70-30) 100 UNIT/ML injection, Inject 10 Units into the skin 2 (two) times daily with a meal., Disp: 10 mL, Rfl: 11 .  lisinopril (ZESTRIL) 20 MG tablet, TAKE 1 Tablet BY MOUTH ONCE DAILY FOR BLOOD PRESSURE, Disp: 30 tablet, Rfl: 0 .  metoprolol tartrate (LOPRESSOR) 50 MG tablet, Take 1 tablet (50 mg total) by mouth 2 (two) times daily., Disp: 60 tablet, Rfl: 0 .  sitaGLIPtin-metformin (JANUMET) 50-500 MG  tablet, Take 1 tablet by mouth 2 (two) times daily with a meal., Disp: , Rfl:    Review of Systems  Per HPI unless specifically indicated above     Objective:      Today's Vitals   07/02/19 0837  BP: 110/68  Pulse: 73  Temp: 97.9 F (36.6 C)  SpO2: 99%   There is no height or weight on file to calculate BMI.    There were no vitals taken for this visit.  Wt Readings from Last 3 Encounters:  06/28/19 (!) 368 lb (166.9 kg)  06/11/19 (!) 377 lb 13.9 oz (171.4 kg)  09/12/18 (!) 375 lb (170.1 kg)    Physical Exam Vitals signs reviewed.  Constitutional:      General: He is not in acute distress.    Appearance: He is well-developed. He is obese. He is not ill-appearing.  HENT:     Head: Normocephalic and atraumatic.  Neck:     Musculoskeletal: Neck supple.  Cardiovascular:     Rate and Rhythm: Normal rate and regular rhythm.     Heart sounds: Murmur present.  Pulmonary:     Effort: Pulmonary effort is normal.     Breath sounds: Normal breath sounds. No wheezing.  Abdominal:     General: Bowel sounds are normal.  Palpations: Abdomen is soft.     Tenderness: There is no abdominal tenderness.  Musculoskeletal:     Right lower leg: No edema.     Left lower leg: Edema present.  Lymphadenopathy:     Cervical: No cervical adenopathy.  Skin:    General: Skin is warm and dry.  Neurological:     Mental Status: He is alert and oriented to person, place, and time.  Psychiatric:        Behavior: Behavior normal.     Results for orders placed or performed during the hospital encounter of 06/28/19  Microalbumin, urine  Result Value Ref Range   Microalb, Ur 18.4 (H) Not Estab. ug/mL  Lipid panel  Result Value Ref Range   Cholesterol 161 0 - 200 mg/dL   Triglycerides 539 <767 mg/dL   HDL 43 >34 mg/dL   Total CHOL/HDL Ratio 3.7 RATIO   VLDL 30 0 - 40 mg/dL   LDL Cholesterol 88 0 - 99 mg/dL  Hemoglobin L9F  Result Value Ref Range   Hgb A1c MFr Bld 11.5 (H) 4.8 -  5.6 %   Mean Plasma Glucose 283.35 mg/dL  Comprehensive metabolic panel  Result Value Ref Range   Sodium 131 (L) 135 - 145 mmol/L   Potassium 5.6 (H) 3.5 - 5.1 mmol/L   Chloride 96 (L) 98 - 111 mmol/L   CO2 24 22 - 32 mmol/L   Glucose, Bld 217 (H) 70 - 99 mg/dL   BUN 16 6 - 20 mg/dL   Creatinine, Ser 7.90 0.61 - 1.24 mg/dL   Calcium 9.6 8.9 - 24.0 mg/dL   Total Protein 7.8 6.5 - 8.1 g/dL   Albumin 4.0 3.5 - 5.0 g/dL   AST 35 15 - 41 U/L   ALT 50 (H) 0 - 44 U/L   Alkaline Phosphatase 57 38 - 126 U/L   Total Bilirubin 1.2 0.3 - 1.2 mg/dL   GFR calc non Af Amer >60 >60 mL/min   GFR calc Af Amer >60 >60 mL/min   Anion gap 11 5 - 15      Assessment & Plan:    Encounter Diagnoses  Name Primary?  Marland Kitchen Uncontrolled type 2 diabetes mellitus with complication (HCC) Yes  . Essential hypertension, benign   . Hyperlipidemia, unspecified hyperlipidemia type   . Morbid obesity, unspecified obesity type (HCC)   . Gangrene of toe of left foot (HCC)   . Aortic stenosis, severe      -reviewed labs with pt .  Will call pt with results of K+ drawn this morning when available.   -will refer for annual DM eye exam  -pt is scheduled for influenza vaccination  -pt to see Surgeon tomorrow as scheduled after MRI and ABI  -Pt to Stop 70/30  Insulin and get back on lantus and start novolog tid ac.  He was given samples of both.  Will Refer to endocrinologist in light of history of uncontrolled diabetes and significant complications he has.   Pt is to monitor his bs and bring log to next appointment where or at endocrinologist.  He is reminded to contact office for fbs < 70 or > 300.  He is given additional reading information on diabetes care.    -Appointment with cardiology as scheduled for aortic stenosis.  Pt likely to need surgery.    -discussed with pt need for weight loss through diet and exercise  -pt to follow up to review bs log 1 month.  He will contact  office sooner prn

## 2019-07-03 ENCOUNTER — Ambulatory Visit: Payer: Self-pay | Admitting: Student

## 2019-07-03 ENCOUNTER — Ambulatory Visit (INDEPENDENT_AMBULATORY_CARE_PROVIDER_SITE_OTHER): Payer: Self-pay | Admitting: Vascular Surgery

## 2019-07-03 ENCOUNTER — Other Ambulatory Visit (HOSPITAL_COMMUNITY)
Admission: RE | Admit: 2019-07-03 | Discharge: 2019-07-03 | Disposition: A | Discharge status: Home / Self Care | Payer: Self-pay | Source: Ambulatory Visit | Attending: Vascular Surgery | Admitting: Vascular Surgery

## 2019-07-03 ENCOUNTER — Other Ambulatory Visit: Payer: Self-pay

## 2019-07-03 ENCOUNTER — Encounter: Payer: Self-pay | Admitting: Vascular Surgery

## 2019-07-03 VITALS — BP 110/81 | HR 68 | Temp 97.5°F | Resp 20 | Ht 70.0 in | Wt 369.7 lb

## 2019-07-03 DIAGNOSIS — I96 Gangrene, not elsewhere classified: Secondary | ICD-10-CM

## 2019-07-03 DIAGNOSIS — Z20828 Contact with and (suspected) exposure to other viral communicable diseases: Secondary | ICD-10-CM | POA: Insufficient documentation

## 2019-07-03 DIAGNOSIS — Z01812 Encounter for preprocedural laboratory examination: Secondary | ICD-10-CM | POA: Insufficient documentation

## 2019-07-03 NOTE — Progress Notes (Signed)
[facility] [place]  Patient name: Adam Long MRN: [id number] DOB: 1976-02-13 Sex: male  REASON FOR CONSULT: Gangrene left fourth toe  HPI: Adam Long is a 43 y.o. male, who is here today for evaluation of gangrenous changes of his left foot.  He is severe poorly controlled type II diabetic with morbid obesity with a BMI of greater than 53.  He apparently stepped on a roofing nail in [place] and has had persistent difficulty with healing.  He was admitted to [facility] on 914 11/05/2014 for antibiotics.  He does have a history of severe aortic stenosis.  Was felt that it was not safe to proceed with toe amputation at [facility] due to his severe cardiac difficulties.  Past Medical History:  Diagnosis Date  . Anxiety   . Cholelithiasis   . Depression   . Diabetes mellitus   . Hypercholesteremia   . Hypertension   . Morbid obesity ([place])     Family History  Problem Relation Age of Onset  . Cancer Mother        brain  . Heart disease Father   . Hyperlipidemia Father   . Hypertension Father   . Stroke Father   . Heart murmur Sister     SOCIAL HISTORY: Social History   Socioeconomic History  . Marital status: Single    Spouse name: Not on file  . Number of children: Not on file  . Years of education: Not on file  . Highest education level: Not on file  Occupational History  . Not on file  Social Needs  . Financial resource strain: Not on file  . Food insecurity    Worry: Not on file    Inability: Not on file  . Transportation needs    Medical: Not on file    Non-medical: Not on file  Tobacco Use  . Smoking status: Never Smoker  . Smokeless tobacco: Former Systems developer    Types: Chew  Substance and Sexual Activity  . Alcohol use: Yes    Comment: occasionally  . Drug use: No  . Sexual activity: Not on file  Lifestyle  . Physical activity    Days per week: Not on file    Minutes  per session: Not on file  . Stress: Not on file  Relationships  . Social Herbalist on phone: Not on file    Gets together: Not on file    Attends religious service: Not on file    Active member of club or organization: Not on file    Attends meetings of clubs or organizations: Not on file    Relationship status: Not on file  . Intimate partner violence    Fear of current or ex partner: Not on file    Emotionally abused: Not on file    Physically abused: Not on file    Forced sexual activity: Not on file  Other Topics Concern  . Not on file  Social History Narrative  . Not on file    Allergies  Allergen Reactions  . Pollen Extract Other (See Comments)    Runny nose, watery eyes, sneezing    Current Outpatient Medications  Medication Sig Dispense Refill  . atorvastatin (LIPITOR) 20 MG tablet Take 1 tablet (20 mg total) by mouth daily. 30 tablet 0  . insulin aspart (NOVOLOG FLEXPEN) 100 UNIT/ML FlexPen Inject tid ac per ssi.   (SSI:  fbs  111-150 -  2 units, 151-200 - 4 units, 201-250 - 6 units, 251-300 - 8 units, 301-350 - 10 units , 351-400 - 12 units) 1 pen 0  . Insulin Glargine (LANTUS SOLOSTAR) 100 UNIT/ML Solostar Pen Inject 10 Units into the skin daily. 1 pen PRN  . lisinopril (ZESTRIL) 20 MG tablet TAKE 1 Tablet BY MOUTH ONCE DAILY FOR BLOOD PRESSURE 30 tablet 0  . metoprolol tartrate (LOPRESSOR) 50 MG tablet Take 1 tablet (50 mg total) by mouth 2 (two) times daily. 60 tablet 0  . sitaGLIPtin-metformin (JANUMET) 50-500 MG tablet Take 1 tablet by mouth 2 (two) times daily with a meal.     No current facility-administered medications for this visit.     REVIEW OF SYSTEMS:  [X]  denotes positive finding, [ ]  denotes negative finding Cardiac  Comments:  Chest pain or chest pressure:    Shortness of breath upon exertion:    Short of breath when lying flat: x   Irregular heart rhythm:        Vascular    Pain in calf, thigh, or hip brought on by ambulation:     Pain in feet at night that wakes you up from your sleep:     Blood clot in your veins:    Leg swelling:  x       Pulmonary    Oxygen at home:    Productive cough:     Wheezing:         Neurologic    Sudden weakness in arms or legs:     Sudden numbness in arms or legs:     Sudden onset of difficulty speaking or slurred speech:    Temporary loss of vision in one eye:     Problems with dizziness:         Gastrointestinal    Blood in stool:     Vomited blood:         Genitourinary    Burning when urinating:     Blood in urine:        Psychiatric    Major depression:  x       Hematologic    Bleeding problems:    Problems with blood clotting too easily:        Skin    Rashes or ulcers:        Constitutional    Fever or chills:      PHYSICAL EXAM: Vitals:   07/03/19 0855  BP: 110/81  Pulse: 68  Resp: 20  Temp: (!) 97.5 F (36.4 C)  SpO2: 100%  Weight: (!) 369 lb 11.2 oz (167.7 kg)  Height: 5\' 10"  (1.778 m)    GENERAL: The patient is a well-nourished male, in no acute distress. The vital signs are documented above. CARDIOVASCULAR: 2+ radial pulses bilaterally.  He has a palpable right dorsalis pedis pulse.  I do not palpate pedal pulse on the left PULMONARY: There is good air exchange  ABDOMEN: Soft and non-tender  MUSCULOSKELETAL: There are no major deformities or cyanosis. NEUROLOGIC: No focal weakness  or paresthesias are detected. SKIN: Gangrene of the entire left fourth toe with blistering on the dorsum of his foot. PSYCHIATRIC: The patient has a normal affect.  DATA:  He underwent noninvasive arterial studies at [facility] on 06/12/2019.  This reveals calcified vessels bilaterally therefore ankle arm index is unobtainable.  Also had difficulty determining the level of his multilevel disease.  He had monophasic waveforms throughout the left lower extremity.  MRI of  his left foot from 06/13/2019 was reviewed as well showing no evidence of abscess at  that time  MEDICAL ISSUES: Gangrene left fourth toe.  This involves the entire toe with erythema and blistering on the dorsum of his foot.  I explained that at minimum he will require amputation of his fourth and fifth toe.  I have concern regarding his arterial flow for healing.  We will proceed with arteriography later this week and make recommendations regarding either primary amputation of his toes versus revascularization and amputation.  I did explain that this certainly is limb threatening.   Larina Earthly, MD FACS [facility] Tel [phone] Pager [phone]

## 2019-07-03 NOTE — H&P (View-Only) (Signed)
[facility] [place]  Patient name: Adam Long MRN: [id number] DOB: 1976-02-13 Sex: male  REASON FOR CONSULT: Gangrene left fourth toe  HPI: Adam Long is a 43 y.o. male, who is here today for evaluation of gangrenous changes of his left foot.  He is severe poorly controlled type II diabetic with morbid obesity with a BMI of greater than 53.  He apparently stepped on a roofing nail in [place] and has had persistent difficulty with healing.  He was admitted to [facility] on 914 11/05/2014 for antibiotics.  He does have a history of severe aortic stenosis.  Was felt that it was not safe to proceed with toe amputation at [facility] due to his severe cardiac difficulties.  Past Medical History:  Diagnosis Date  . Anxiety   . Cholelithiasis   . Depression   . Diabetes mellitus   . Hypercholesteremia   . Hypertension   . Morbid obesity ([place])     Family History  Problem Relation Age of Onset  . Cancer Mother        brain  . Heart disease Father   . Hyperlipidemia Father   . Hypertension Father   . Stroke Father   . Heart murmur Sister     SOCIAL HISTORY: Social History   Socioeconomic History  . Marital status: Single    Spouse name: Not on file  . Number of children: Not on file  . Years of education: Not on file  . Highest education level: Not on file  Occupational History  . Not on file  Social Needs  . Financial resource strain: Not on file  . Food insecurity    Worry: Not on file    Inability: Not on file  . Transportation needs    Medical: Not on file    Non-medical: Not on file  Tobacco Use  . Smoking status: Never Smoker  . Smokeless tobacco: Former Systems developer    Types: Chew  Substance and Sexual Activity  . Alcohol use: Yes    Comment: occasionally  . Drug use: No  . Sexual activity: Not on file  Lifestyle  . Physical activity    Days per week: Not on file    Minutes  per session: Not on file  . Stress: Not on file  Relationships  . Social Herbalist on phone: Not on file    Gets together: Not on file    Attends religious service: Not on file    Active member of club or organization: Not on file    Attends meetings of clubs or organizations: Not on file    Relationship status: Not on file  . Intimate partner violence    Fear of current or ex partner: Not on file    Emotionally abused: Not on file    Physically abused: Not on file    Forced sexual activity: Not on file  Other Topics Concern  . Not on file  Social History Narrative  . Not on file    Allergies  Allergen Reactions  . Pollen Extract Other (See Comments)    Runny nose, watery eyes, sneezing    Current Outpatient Medications  Medication Sig Dispense Refill  . atorvastatin (LIPITOR) 20 MG tablet Take 1 tablet (20 mg total) by mouth daily. 30 tablet 0  . insulin aspart (NOVOLOG FLEXPEN) 100 UNIT/ML FlexPen Inject tid ac per ssi.   (SSI:  fbs  111-150 -  2 units, 151-200 - 4 units, 201-250 - 6 units, 251-300 - 8 units, 301-350 - 10 units , 351-400 - 12 units) 1 pen 0  . Insulin Glargine (LANTUS SOLOSTAR) 100 UNIT/ML Solostar Pen Inject 10 Units into the skin daily. 1 pen PRN  . lisinopril (ZESTRIL) 20 MG tablet TAKE 1 Tablet BY MOUTH ONCE DAILY FOR BLOOD PRESSURE 30 tablet 0  . metoprolol tartrate (LOPRESSOR) 50 MG tablet Take 1 tablet (50 mg total) by mouth 2 (two) times daily. 60 tablet 0  . sitaGLIPtin-metformin (JANUMET) 50-500 MG tablet Take 1 tablet by mouth 2 (two) times daily with a meal.     No current facility-administered medications for this visit.     REVIEW OF SYSTEMS:  [X] denotes positive finding, [ ] denotes negative finding Cardiac  Comments:  Chest pain or chest pressure:    Shortness of breath upon exertion:    Short of breath when lying flat: x   Irregular heart rhythm:        Vascular    Pain in calf, thigh, or hip brought on by ambulation:     Pain in feet at night that wakes you up from your sleep:     Blood clot in your veins:    Leg swelling:  x       Pulmonary    Oxygen at home:    Productive cough:     Wheezing:         Neurologic    Sudden weakness in arms or legs:     Sudden numbness in arms or legs:     Sudden onset of difficulty speaking or slurred speech:    Temporary loss of vision in one eye:     Problems with dizziness:         Gastrointestinal    Blood in stool:     Vomited blood:         Genitourinary    Burning when urinating:     Blood in urine:        Psychiatric    Major depression:  x       Hematologic    Bleeding problems:    Problems with blood clotting too easily:        Skin    Rashes or ulcers:        Constitutional    Fever or chills:      PHYSICAL EXAM: Vitals:   07/03/19 0855  BP: 110/81  Pulse: 68  Resp: 20  Temp: (!) 97.5 F (36.4 C)  SpO2: 100%  Weight: (!) 369 lb 11.2 oz (167.7 kg)  Height: 5' 10" (1.778 m)    GENERAL: The patient is a well-nourished male, in no acute distress. The vital signs are documented above. CARDIOVASCULAR: 2+ radial pulses bilaterally.  He has a palpable right dorsalis pedis pulse.  I do not palpate pedal pulse on the left PULMONARY: There is good air exchange  ABDOMEN: Soft and non-tender  MUSCULOSKELETAL: There are no major deformities or cyanosis. NEUROLOGIC: No focal weakness  or paresthesias are detected. SKIN: Gangrene of the entire left fourth toe with blistering on the dorsum of his foot. PSYCHIATRIC: The patient has a normal affect.  DATA:  He underwent noninvasive arterial studies at [facility] on 06/12/2019.  This reveals calcified vessels bilaterally therefore ankle arm index is unobtainable.  Also had difficulty determining the level of his multilevel disease.  He had monophasic waveforms throughout the left lower extremity.  MRI of   his left foot from 06/13/2019 was reviewed as well showing no evidence of abscess at  that time  MEDICAL ISSUES: Gangrene left fourth toe.  This involves the entire toe with erythema and blistering on the dorsum of his foot.  I explained that at minimum he will require amputation of his fourth and fifth toe.  I have concern regarding his arterial flow for healing.  We will proceed with arteriography later this week and make recommendations regarding either primary amputation of his toes versus revascularization and amputation.  I did explain that this certainly is limb threatening.   Larina Earthly, MD FACS [facility] Tel [phone] Pager [phone]

## 2019-07-04 LAB — NOVEL CORONAVIRUS, NAA (HOSP ORDER, SEND-OUT TO REF LAB; TAT 18-24 HRS): SARS-CoV-2, NAA: NOT DETECTED

## 2019-07-06 ENCOUNTER — Inpatient Hospital Stay (HOSPITAL_COMMUNITY)
Admission: RE | Admit: 2019-07-06 | Discharge: 2019-07-10 | DRG: 240 | Disposition: A | Discharge status: Home Health | Payer: Self-pay | Attending: Vascular Surgery | Admitting: Vascular Surgery

## 2019-07-06 ENCOUNTER — Other Ambulatory Visit: Payer: Self-pay

## 2019-07-06 ENCOUNTER — Encounter (HOSPITAL_COMMUNITY)
Admission: RE | Disposition: A | Discharge status: Home Health | Payer: Self-pay | Source: Home / Self Care | Attending: Vascular Surgery

## 2019-07-06 DIAGNOSIS — Z6841 Body Mass Index (BMI) 40.0 and over, adult: Secondary | ICD-10-CM

## 2019-07-06 DIAGNOSIS — E7800 Pure hypercholesterolemia, unspecified: Secondary | ICD-10-CM | POA: Diagnosis present

## 2019-07-06 DIAGNOSIS — I10 Essential (primary) hypertension: Secondary | ICD-10-CM | POA: Diagnosis present

## 2019-07-06 DIAGNOSIS — I96 Gangrene, not elsewhere classified: Secondary | ICD-10-CM | POA: Diagnosis present

## 2019-07-06 DIAGNOSIS — I350 Nonrheumatic aortic (valve) stenosis: Secondary | ICD-10-CM | POA: Diagnosis present

## 2019-07-06 DIAGNOSIS — L03032 Cellulitis of left toe: Secondary | ICD-10-CM | POA: Diagnosis present

## 2019-07-06 DIAGNOSIS — E1152 Type 2 diabetes mellitus with diabetic peripheral angiopathy with gangrene: Principal | ICD-10-CM | POA: Diagnosis present

## 2019-07-06 DIAGNOSIS — F329 Major depressive disorder, single episode, unspecified: Secondary | ICD-10-CM | POA: Diagnosis present

## 2019-07-06 DIAGNOSIS — Z87891 Personal history of nicotine dependence: Secondary | ICD-10-CM

## 2019-07-06 DIAGNOSIS — Z794 Long term (current) use of insulin: Secondary | ICD-10-CM

## 2019-07-06 DIAGNOSIS — F419 Anxiety disorder, unspecified: Secondary | ICD-10-CM | POA: Diagnosis present

## 2019-07-06 HISTORY — PX: ABDOMINAL AORTOGRAM W/LOWER EXTREMITY: CATH118223

## 2019-07-06 HISTORY — DX: Gangrene, not elsewhere classified: I96

## 2019-07-06 LAB — POCT I-STAT, CHEM 8
BUN: 12 mg/dL (ref 6–20)
Calcium, Ion: 1.19 mmol/L (ref 1.15–1.40)
Chloride: 100 mmol/L (ref 98–111)
Creatinine, Ser: 0.7 mg/dL (ref 0.61–1.24)
Glucose, Bld: 208 mg/dL — ABNORMAL HIGH (ref 70–99)
HCT: 43 % (ref 39.0–52.0)
Hemoglobin: 14.6 g/dL (ref 13.0–17.0)
Potassium: 4.2 mmol/L (ref 3.5–5.1)
Sodium: 136 mmol/L (ref 135–145)
TCO2: 23 mmol/L (ref 22–32)

## 2019-07-06 LAB — GLUCOSE, CAPILLARY
Glucose-Capillary: 160 mg/dL — ABNORMAL HIGH (ref 70–99)
Glucose-Capillary: 131 mg/dL — ABNORMAL HIGH (ref 70–99)
Glucose-Capillary: 271 mg/dL — ABNORMAL HIGH (ref 70–99)

## 2019-07-06 SURGERY — ABDOMINAL AORTOGRAM W/LOWER EXTREMITY
Anesthesia: LOCAL

## 2019-07-06 MED ORDER — SODIUM CHLORIDE 0.9% FLUSH
3.0000 mL | Freq: Two times a day (BID) | INTRAVENOUS | Status: DC
  Administered 2019-07-08 – 2019-07-10 (×2): 3 mL via INTRAVENOUS

## 2019-07-06 MED ORDER — HEPARIN SODIUM (PORCINE) 1000 UNIT/ML IJ SOLN
INTRAMUSCULAR | Status: AC
  Filled 2019-07-06: qty 1

## 2019-07-06 MED ORDER — MORPHINE SULFATE (PF) 2 MG/ML IV SOLN: 2.0000 mg | INTRAVENOUS | Status: DC | PRN

## 2019-07-06 MED ORDER — MIDAZOLAM HCL 2 MG/2ML IJ SOLN
INTRAMUSCULAR | Status: AC
  Filled 2019-07-06: qty 2

## 2019-07-06 MED ORDER — LIDOCAINE HCL (PF) 1 % IJ SOLN
INTRAMUSCULAR | Status: AC
  Filled 2019-07-06: qty 30

## 2019-07-06 MED ORDER — FENTANYL CITRATE (PF) 100 MCG/2ML IJ SOLN
INTRAMUSCULAR | Status: AC
  Filled 2019-07-06: qty 2

## 2019-07-06 MED ORDER — VANCOMYCIN HCL 10 G IV SOLR
2500.0000 mg | Freq: Once | INTRAVENOUS | Status: AC
  Administered 2019-07-06: 14:00:00 2500 mg via INTRAVENOUS
  Filled 2019-07-06: qty 2500

## 2019-07-06 MED ORDER — ASPIRIN EC 81 MG PO TBEC
81.0000 mg | DELAYED_RELEASE_TABLET | Freq: Every day | ORAL | Status: DC
  Administered 2019-07-07 – 2019-07-10 (×4): 81 mg via ORAL
  Filled 2019-07-06 (×4): qty 1

## 2019-07-06 MED ORDER — PIPERACILLIN-TAZOBACTAM 3.375 G IVPB
3.3750 g | Freq: Three times a day (TID) | INTRAVENOUS | Status: DC
  Administered 2019-07-06 – 2019-07-10 (×12): 3.375 g via INTRAVENOUS
  Filled 2019-07-06 (×12): qty 50

## 2019-07-06 MED ORDER — INSULIN ASPART 100 UNIT/ML ~~LOC~~ SOLN
6.0000 [IU] | Freq: Three times a day (TID) | SUBCUTANEOUS | Status: DC
  Administered 2019-07-07 – 2019-07-10 (×7): 6 [IU] via SUBCUTANEOUS

## 2019-07-06 MED ORDER — INSULIN GLARGINE 100 UNIT/ML ~~LOC~~ SOLN
10.0000 [IU] | Freq: Every day | SUBCUTANEOUS | Status: DC
  Administered 2019-07-06 – 2019-07-09 (×4): 10 [IU] via SUBCUTANEOUS
  Filled 2019-07-06 (×7): qty 0.1

## 2019-07-06 MED ORDER — PIPERACILLIN-TAZOBACTAM 3.375 G IVPB
3.3750 g | Freq: Three times a day (TID) | INTRAVENOUS | Status: DC
  Filled 2019-07-06: qty 50

## 2019-07-06 MED ORDER — SODIUM CHLORIDE 0.9 % IV SOLN: INTRAVENOUS | Status: DC

## 2019-07-06 MED ORDER — OXYCODONE HCL 5 MG PO TABS: 5.0000 mg | ORAL_TABLET | ORAL | Status: DC | PRN

## 2019-07-06 MED ORDER — IODIXANOL 320 MG/ML IV SOLN
INTRAVENOUS | Status: DC | PRN
  Administered 2019-07-06: 155 mL

## 2019-07-06 MED ORDER — HEPARIN (PORCINE) IN NACL 1000-0.9 UT/500ML-% IV SOLN
INTRAVENOUS | Status: AC
  Filled 2019-07-06: qty 500

## 2019-07-06 MED ORDER — ATORVASTATIN CALCIUM 10 MG PO TABS
20.0000 mg | ORAL_TABLET | Freq: Every day | ORAL | Status: DC
  Administered 2019-07-07 – 2019-07-10 (×3): 20 mg via ORAL
  Filled 2019-07-06 (×3): qty 2

## 2019-07-06 MED ORDER — VANCOMYCIN HCL 10 G IV SOLR
1750.0000 mg | Freq: Three times a day (TID) | INTRAVENOUS | Status: DC
  Administered 2019-07-06: 1750 mg via INTRAVENOUS
  Filled 2019-07-06 (×3): qty 1750

## 2019-07-06 MED ORDER — LABETALOL HCL 5 MG/ML IV SOLN: 10.0000 mg | INTRAVENOUS | Status: DC | PRN

## 2019-07-06 MED ORDER — MIDAZOLAM HCL 2 MG/2ML IJ SOLN
INTRAMUSCULAR | Status: DC | PRN
  Administered 2019-07-06: 1 mg via INTRAVENOUS

## 2019-07-06 MED ORDER — ENOXAPARIN SODIUM 80 MG/0.8ML ~~LOC~~ SOLN
70.0000 mg | SUBCUTANEOUS | Status: DC
  Filled 2019-07-06: qty 0.7

## 2019-07-06 MED ORDER — HYDRALAZINE HCL 20 MG/ML IJ SOLN: 5.0000 mg | INTRAMUSCULAR | Status: DC | PRN

## 2019-07-06 MED ORDER — LISINOPRIL 10 MG PO TABS
20.0000 mg | ORAL_TABLET | Freq: Every day | ORAL | Status: DC
  Administered 2019-07-06 – 2019-07-10 (×5): 20 mg via ORAL
  Filled 2019-07-06 (×2): qty 2
  Filled 2019-07-06: qty 1
  Filled 2019-07-06 (×2): qty 2

## 2019-07-06 MED ORDER — SODIUM CHLORIDE 0.9% FLUSH: 3.0000 mL | INTRAVENOUS | Status: DC | PRN

## 2019-07-06 MED ORDER — ENOXAPARIN SODIUM 80 MG/0.8ML ~~LOC~~ SOLN
80.0000 mg | SUBCUTANEOUS | Status: DC
  Administered 2019-07-06 – 2019-07-07 (×2): 80 mg via SUBCUTANEOUS
  Filled 2019-07-06: qty 0.8

## 2019-07-06 MED ORDER — NITROGLYCERIN 1 MG/10 ML FOR IR/CATH LAB
INTRA_ARTERIAL | Status: DC | PRN
  Administered 2019-07-06: 200 ug via INTRA_ARTERIAL

## 2019-07-06 MED ORDER — HEPARIN (PORCINE) IN NACL 1000-0.9 UT/500ML-% IV SOLN
INTRAVENOUS | Status: AC
  Filled 2019-07-06: qty 1000

## 2019-07-06 MED ORDER — ACETAMINOPHEN 325 MG PO TABS: 650.0000 mg | ORAL_TABLET | ORAL | Status: DC | PRN

## 2019-07-06 MED ORDER — SODIUM CHLORIDE 0.9 % IV SOLN: INTRAVENOUS | Status: AC

## 2019-07-06 MED ORDER — FENTANYL CITRATE (PF) 100 MCG/2ML IJ SOLN
INTRAMUSCULAR | Status: DC | PRN
  Administered 2019-07-06: 25 ug via INTRAVENOUS

## 2019-07-06 MED ORDER — INSULIN GLARGINE 100 UNIT/ML SOLOSTAR PEN: 10.0000 [IU] | PEN_INJECTOR | Freq: Every day | SUBCUTANEOUS | Status: DC

## 2019-07-06 MED ORDER — SODIUM CHLORIDE 0.9 % IV SOLN: 250.0000 mL | INTRAVENOUS | Status: DC | PRN

## 2019-07-06 MED ORDER — INSULIN ASPART 100 UNIT/ML ~~LOC~~ SOLN
0.0000 [IU] | Freq: Three times a day (TID) | SUBCUTANEOUS | Status: DC
  Administered 2019-07-07 – 2019-07-08 (×3): 4 [IU] via SUBCUTANEOUS
  Administered 2019-07-08: 07:00:00 3 [IU] via SUBCUTANEOUS
  Administered 2019-07-08: 18:00:00 7 [IU] via SUBCUTANEOUS
  Administered 2019-07-09: 4 [IU] via SUBCUTANEOUS
  Administered 2019-07-09: 18:00:00 7 [IU] via SUBCUTANEOUS
  Administered 2019-07-10: 07:00:00 4 [IU] via SUBCUTANEOUS

## 2019-07-06 MED ORDER — HEPARIN (PORCINE) IN NACL 1000-0.9 UT/500ML-% IV SOLN
INTRAVENOUS | Status: DC | PRN
  Administered 2019-07-06: 500 mL

## 2019-07-06 MED ORDER — HEPARIN SODIUM (PORCINE) 1000 UNIT/ML IJ SOLN
INTRAMUSCULAR | Status: DC | PRN
  Administered 2019-07-06: 2000 [IU] via INTRAVENOUS

## 2019-07-06 MED ORDER — LIDOCAINE HCL (PF) 1 % IJ SOLN
INTRAMUSCULAR | Status: DC | PRN
  Administered 2019-07-06: 5 mL
  Administered 2019-07-06: 30 mL

## 2019-07-06 MED ORDER — NITROGLYCERIN 1 MG/10 ML FOR IR/CATH LAB
INTRA_ARTERIAL | Status: AC
  Filled 2019-07-06: qty 10

## 2019-07-06 MED ORDER — ONDANSETRON HCL 4 MG/2ML IJ SOLN
4.0000 mg | Freq: Four times a day (QID) | INTRAMUSCULAR | Status: DC | PRN
  Administered 2019-07-09: 4 mg via INTRAVENOUS

## 2019-07-06 MED ORDER — METOPROLOL TARTRATE 50 MG PO TABS
50.0000 mg | ORAL_TABLET | Freq: Two times a day (BID) | ORAL | Status: DC
  Administered 2019-07-06 – 2019-07-10 (×9): 50 mg via ORAL
  Filled 2019-07-06 (×9): qty 1

## 2019-07-06 SURGICAL SUPPLY — 15 items
CATH ANGIO 5F PIGTAIL 100CM (CATHETERS) ×2 IMPLANT
CATH ANGIO 5F PIGTAIL 65CM (CATHETERS) ×2 IMPLANT
CATH INFINITI VERT 5FR 125CM (CATHETERS) ×2 IMPLANT
KIT MICROPUNCTURE NIT STIFF (SHEATH) ×2 IMPLANT
KIT PV (KITS) ×2 IMPLANT
NEEDLE 18GX3-1/2 9CM (NEEDLE) ×2 IMPLANT
SHEATH PINNACLE 5F 10CM (SHEATH) ×2 IMPLANT
SHEATH PROBE COVER 6X72 (BAG) ×4 IMPLANT
STOPCOCK MORSE 400PSI 3WAY (MISCELLANEOUS) ×2 IMPLANT
SYR MEDRAD MARK V 150ML (SYRINGE) ×2 IMPLANT
TRANSDUCER W/STOPCOCK (MISCELLANEOUS) ×2 IMPLANT
TRAY PV CATH (CUSTOM PROCEDURE TRAY) ×2 IMPLANT
TUBING HIGH PRESSURE 120CM (CONNECTOR) ×2 IMPLANT
WIRE BENTSON .035X145CM (WIRE) ×2 IMPLANT
WIRE HI TORQ VERSACORE J 260CM (WIRE) ×2 IMPLANT

## 2019-07-06 NOTE — Progress Notes (Addendum)
Pharmacy Antibiotic Note  Adam Long is a 43 y.o. male admitted on 07/06/2019. Pharmacy has been consulted for vancomycin and zosyn dosing for possible foot infection.  Vancomycin 1750 mg IV Q 8 hrs. Goal AUC 400-550. Expected AUC: 489 SCr used: 0.7  Plan: Vancomycin 2500mg  IV x1 then 1750 q8 hours Zosyn 3.375g IV q8 hours -Will plan on bmet x the next 3 days and vancomycin levels this weekend Lovenox for px - will give 80mg  q24 hours (0.5mg /kg/day)  Height: 5\' 10"  (177.8 cm) Weight: (!) 368 lb (166.9 kg) IBW/kg (Calculated) : 73  Temp (24hrs), Avg:97.6 F (36.4 C), Min:97.6 F (36.4 C), Max:97.6 F (36.4 C)  Recent Labs  Lab 07/06/19 0823  CREATININE 0.70    Estimated Creatinine Clearance: 186.3 mL/min (by C-G formula based on SCr of 0.7 mg/dL).    Allergies  Allergen Reactions  . Pollen Extract Other (See Comments)    Runny nose, watery eyes, sneezing     Thank you for allowing pharmacy to be a part of this patient's care.  Erin Hearing PharmD., BCPS Clinical Pharmacist 07/06/2019 1:36 PM

## 2019-07-06 NOTE — Op Note (Addendum)
Procedure: Ultrasound right groin, attempted right femoral artery catheterization, ultrasound left brachial artery, cannulation left brachial artery, abdominal aortogram with bilateral lower extremity runoff  Preoperative diagnosis: Gangrene left foot  Postoperative diagnosis: Same  Anesthesia: Local with IV sedation  Operative findings: No significant arterial occlusive disease bilaterally  Operative details: After team informed consent, the patient was taken the PV lab.  The patient was placed in supine position operating table.  Both groins were prepped and draped in usual sterile fashion.  Local anesthesia was infiltrated in the right groin over the area that was thought to be near the common femoral artery.  Due to the patient's significant obesity there were essentially no bony landmarks.  Fluoroscopy was performed to determine the location of the right femoral head.  Ultrasound was used but would not penetrate deep enough to actually identify any vessels in the right groin.  At this point I decided to work off of bony landmarks.  Using a long introducer needle I made about 12 attempts to cannulate the right femoral artery.  The vein was cannulated once.  The artery was cannulated once but the guidewire would not advance.  At this point attempts were abandoned in the right groin.  Attention was then turned to the left upper extremity.  Left antecubital area was prepped and draped in usual sterile fashion.  Ultrasound was used to identify the left brachial artery.  A micropuncture needle was used to catheterize the left brachial artery without event.  An 035 versa core wire was then advanced up into the aortic arch.  A 5 French sheath placed over the guidewire in the left brachial artery.  A long 5 French pigtail catheter was then advanced over the guidewire to direct the catheter into the descending thoracic aorta followed by the abdominal aorta.  Abdominal aortogram was then obtained in AP  projection.  Left and right renal arteries are patent.  Infrarenal abdominal aorta is patent.  Left and right common external and internal iliac arteries are patent.  Due to the patient's obesity the images were fairly grainy but well imaged overall.  At this point we performed bilateral extremity runoff views through the pigtail catheter.  In the right lower extremity, the right common femoral profundofemoral superficial femoral popliteal anterior tibial posterior tibial and peroneal arteries are all widely patent all the way into the right foot.  In the left lower extremity we were able to visualize down to the level of the mid calf.  The left common femoral profunda femoris and superficial femoral profunda popliteal artery and proximal tibial arteries were all widely patent.  There is a variant anatomy with anterior tibial artery coming off at about the tibial plateau.  In order to better opacify the distal tibial vessels and pedal vessels the pigtail catheter was swapped out over guidewire for a long Berenstein 2 catheter.  This was advanced down into the left iliac system all the way to the left common femoral artery.  Additional lower extremity views were obtained which again shows the left anterior tibial posterior tibial and peroneal arteries are all widely patent.  There is good flow into the foot with the dorsalis pedis and posterior tibial arteries being widely patent.  At this point the [facility] catheter was removed.  The 5 French sheath was left in place to pulled the holding area.  5 French sheath was filled with 2000 units of heparin and nitroglycerin after initial flush.  Patient tolerated procedure well and there were no  complications.  Operative management: The patient will be scheduled for left fourth toe amputation by Dr. early on Monday, July 09, 2019.  He will be admitted for IV antibiotics today. Patients uncle Sonia Side updated by phone  Ruta Hinds, MD [facility] [place] Office: [phone] Pager: [phone]

## 2019-07-06 NOTE — Interval H&P Note (Signed)
History and Physical Interval Note:  07/06/2019 8:39 AM  Adam Long  has presented today for surgery, with the diagnosis of grangren left 4th toe.  The various methods of treatment have been discussed with the patient and family. After consideration of risks, benefits and other options for treatment, the patient has consented to  Procedure(s): ABDOMINAL AORTOGRAM W/LOWER EXTREMITY (N/A) as a surgical intervention.  The patient's history has been reviewed, patient examined, no change in status, stable for surgery.  I have reviewed the patient's chart and labs.  Questions were answered to the patient's satisfaction.     Ruta Hinds

## 2019-07-06 NOTE — Progress Notes (Signed)
Pt admitted for IV antibiotics over weekend toe amp Dr Early on Monday      Ruta Hinds, MD [facility] [place] Office: [phone] Pager: [phone]

## 2019-07-06 NOTE — Progress Notes (Signed)
74fr sheath aspirated and removed from left brachial artery. Manual pressure applied for 30 minutes. Tegaderm dressing applied. Site rebled, hematoma forming. Manual pressure reapplied for 20 minutes. Site level 1 slight hematoma present 1 inch proximal and distal to stick site.   Left radial pulse palpable, spo2 100 as measured in left thumb.   Bedrest instructions given. Armboard support applied.  Bedrest begins at 12:35:00

## 2019-07-07 LAB — BASIC METABOLIC PANEL
Anion gap: 10 (ref 5–15)
BUN: 10 mg/dL (ref 6–20)
CO2: 24 mmol/L (ref 22–32)
Calcium: 8.9 mg/dL (ref 8.9–10.3)
Chloride: 101 mmol/L (ref 98–111)
Creatinine, Ser: 0.77 mg/dL (ref 0.61–1.24)
GFR calc Af Amer: >60 mL/min (ref >60)
GFR calc non Af Amer: >60 mL/min (ref >60)
Glucose, Bld: 169 mg/dL — ABNORMAL HIGH (ref 70–99)
Potassium: 3.9 mmol/L (ref 3.5–5.1)
Sodium: 135 mmol/L (ref 135–145)

## 2019-07-07 LAB — GLUCOSE, CAPILLARY
Glucose-Capillary: 168 mg/dL — ABNORMAL HIGH (ref 70–99)
Glucose-Capillary: 193 mg/dL — ABNORMAL HIGH (ref 70–99)
Glucose-Capillary: 181 mg/dL — ABNORMAL HIGH (ref 70–99)
Glucose-Capillary: 167 mg/dL — ABNORMAL HIGH (ref 70–99)

## 2019-07-07 MED ORDER — LORAZEPAM 1 MG PO TABS
2.0000 mg | ORAL_TABLET | Freq: Four times a day (QID) | ORAL | Status: DC | PRN
  Administered 2019-07-07 – 2019-07-09 (×5): 2 mg via ORAL
  Filled 2019-07-07 (×5): qty 2

## 2019-07-07 MED ORDER — VANCOMYCIN HCL 10 G IV SOLR
2000.0000 mg | Freq: Two times a day (BID) | INTRAVENOUS | Status: DC
  Administered 2019-07-07 – 2019-07-09 (×5): 2000 mg via INTRAVENOUS
  Filled 2019-07-07 (×7): qty 2000

## 2019-07-07 NOTE — Progress Notes (Signed)
[facility] [place]  Subjective  - some anxiety   Objective 115/85 65 98.1 F (36.7 C) (Oral) 16 96%  Intake/Output Summary (Last 24 hours) at 07/07/2019 0808 Last data filed at 07/07/2019 0119 Gross per 24 hour  Intake 240 ml  Output -  Net 240 ml   Right 4th toe gangrene drier less erythema  Assessment/Planning: Gangrene 4th toe with cellulitis Continue IV antibiotics 4th toe amp Dr Donnetta Hutching Monday  Ruta Hinds 07/07/2019 8:08 AM --  Laboratory Lab Results: Recent Labs    07/06/19 0823  HGB 14.6  HCT 43.0   BMET Recent Labs    07/06/19 0823 07/07/19 0318  NA 136 135  K 4.2 3.9  CL 100 101  CO2  --  24  GLUCOSE 208* 169*  BUN 12 10  CREATININE 0.70 0.77  CALCIUM  --  8.9    COAG Lab Results  Component Value Date   INR 1.1 06/11/2019   No results found for: PTT

## 2019-07-07 NOTE — Progress Notes (Signed)
Pt requesting to leave and come back on Monday for surgery. Per Dr. Oneida Alar this is not possible.  Pt would have to sign out AMA. Pt reports high anxiety. Notified MD and orders recd. Pt states he will try to relax and stay until m onday.

## 2019-07-07 NOTE — Progress Notes (Signed)
Pharmacy Antibiotic Note  Adam Long is a 43 y.o. male admitted on 07/06/2019. Pharmacy has been consulted for vancomycin and zosyn dosing for possible foot infection.   Today is day 2 of ABX. Patient remains afebrile. Renal function stable. Adjusting vancomycin dose according to Vancomycin per pharmacy protocol.   Vancomycin 2000 mg IV Q 12 hrs. Goal AUC 400-550. Expected AUC: 450.3  SCr used: 0.8  Plan: Change Vancomycin to 2000 mg IV Q 12 hrs Zosyn 3.375g IV q8 hours -Will plan on bmet x the next 3 days and vancomycin levels tomorrow  Height: 5\' 10"  (177.8 cm) Weight: (!) 368 lb (166.9 kg) IBW/kg (Calculated) : 73  Temp (24hrs), Avg:98.1 F (36.7 C), Min:97.9 F (36.6 C), Max:98.2 F (36.8 C)  Recent Labs  Lab 07/06/19 0823 07/07/19 0318  CREATININE 0.70 0.77    Estimated Creatinine Clearance: 186.3 mL/min (by C-G formula based on SCr of 0.77 mg/dL).    Allergies  Allergen Reactions  . Pollen Extract Other (See Comments)    Runny nose, watery eyes, sneezing    Richardine Service, PharmD PGY1 Pharmacy Resident Phone: [phone] 07/07/2019  7:29 AM  Please check [email or web address] for unit-specific pharmacy phone numbers.

## 2019-07-07 NOTE — Plan of Care (Signed)

## 2019-07-08 LAB — BASIC METABOLIC PANEL
Anion gap: 10 (ref 5–15)
BUN: 9 mg/dL (ref 6–20)
CO2: 25 mmol/L (ref 22–32)
Calcium: 9 mg/dL (ref 8.9–10.3)
Chloride: 100 mmol/L (ref 98–111)
Creatinine, Ser: 0.73 mg/dL (ref 0.61–1.24)
GFR calc Af Amer: >60 mL/min (ref >60)
GFR calc non Af Amer: >60 mL/min (ref >60)
Glucose, Bld: 158 mg/dL — ABNORMAL HIGH (ref 70–99)
Potassium: 3.8 mmol/L (ref 3.5–5.1)
Sodium: 135 mmol/L (ref 135–145)

## 2019-07-08 LAB — GLUCOSE, CAPILLARY
Glucose-Capillary: 144 mg/dL — ABNORMAL HIGH (ref 70–99)
Glucose-Capillary: 196 mg/dL — ABNORMAL HIGH (ref 70–99)
Glucose-Capillary: 203 mg/dL — ABNORMAL HIGH (ref 70–99)
Glucose-Capillary: 181 mg/dL — ABNORMAL HIGH (ref 70–99)

## 2019-07-08 NOTE — H&P (View-Only) (Signed)
[facility] [place]  Subjective  - feels ok no pain in foot   Objective 113/73 70 97.8 F (36.6 C) (Oral) 18 99%  Intake/Output Summary (Last 24 hours) at 07/08/2019 1109 Last data filed at 07/08/2019 0654 Gross per 24 hour  Intake 1750.22 ml  Output -  Net 1750.22 ml   Left arm no hematoma Left foot still with necrotic fourth toe some erythema  Assessment/Planning: Pt for left 4th toe amp tomorrow D/w pt that erythema in foot may mean deeper infection and Dr Early will assess Pt would prefer block tomorrow if possible. Will d/c lovenox for now Pt is ambulatory but will use SCDs in bed to reduce DVT risk  Mikka Kissner 07/08/2019 11:09 AM --  Laboratory Lab Results: Recent Labs    07/06/19 0823  HGB 14.6  HCT 43.0   BMET Recent Labs    07/07/19 0318 07/08/19 0435  NA 135 135  K 3.9 3.8  CL 101 100  CO2 24 25  GLUCOSE 169* 158*  BUN 10 9  CREATININE 0.77 0.73  CALCIUM 8.9 9.0    COAG Lab Results  Component Value Date   INR 1.1 06/11/2019   No results found for: PTT     

## 2019-07-08 NOTE — Progress Notes (Signed)
[facility] [place]  Subjective  - feels ok no pain in foot   Objective 113/73 70 97.8 F (36.6 C) (Oral) 18 99%  Intake/Output Summary (Last 24 hours) at 07/08/2019 1109 Last data filed at 07/08/2019 0654 Gross per 24 hour  Intake 1750.22 ml  Output -  Net 1750.22 ml   Left arm no hematoma Left foot still with necrotic fourth toe some erythema  Assessment/Planning: Pt for left 4th toe amp tomorrow D/w pt that erythema in foot may mean deeper infection and Dr Donnetta Hutching will assess Pt would prefer block tomorrow if possible. Will d/c lovenox for now Pt is ambulatory but will use SCDs in bed to reduce DVT risk  Ruta Hinds 07/08/2019 11:09 AM --  Laboratory Lab Results: Recent Labs    07/06/19 0823  HGB 14.6  HCT 43.0   BMET Recent Labs    07/07/19 0318 07/08/19 0435  NA 135 135  K 3.9 3.8  CL 101 100  CO2 24 25  GLUCOSE 169* 158*  BUN 10 9  CREATININE 0.77 0.73  CALCIUM 8.9 9.0    COAG Lab Results  Component Value Date   INR 1.1 06/11/2019   No results found for: PTT

## 2019-07-09 ENCOUNTER — Inpatient Hospital Stay (HOSPITAL_COMMUNITY): Payer: Self-pay | Admitting: Anesthesiology

## 2019-07-09 ENCOUNTER — Encounter (HOSPITAL_COMMUNITY)
Admission: RE | Disposition: A | Discharge status: Home Health | Payer: Self-pay | Source: Home / Self Care | Attending: Vascular Surgery

## 2019-07-09 ENCOUNTER — Encounter (HOSPITAL_COMMUNITY): Payer: Self-pay | Admitting: Vascular Surgery

## 2019-07-09 DIAGNOSIS — I96 Gangrene, not elsewhere classified: Secondary | ICD-10-CM

## 2019-07-09 HISTORY — PX: AMPUTATION: SHX166

## 2019-07-09 LAB — GLUCOSE, CAPILLARY
Glucose-Capillary: 167 mg/dL — ABNORMAL HIGH (ref 70–99)
Glucose-Capillary: 182 mg/dL — ABNORMAL HIGH (ref 70–99)
Glucose-Capillary: 239 mg/dL — ABNORMAL HIGH (ref 70–99)
Glucose-Capillary: 155 mg/dL — ABNORMAL HIGH (ref 70–99)

## 2019-07-09 LAB — BASIC METABOLIC PANEL
Anion gap: 10 (ref 5–15)
BUN: 8 mg/dL (ref 6–20)
CO2: 24 mmol/L (ref 22–32)
Calcium: 8.6 mg/dL — ABNORMAL LOW (ref 8.9–10.3)
Chloride: 100 mmol/L (ref 98–111)
Creatinine, Ser: 0.69 mg/dL (ref 0.61–1.24)
GFR calc Af Amer: >60 mL/min (ref >60)
GFR calc non Af Amer: >60 mL/min (ref >60)
Glucose, Bld: 171 mg/dL — ABNORMAL HIGH (ref 70–99)
Potassium: 3.7 mmol/L (ref 3.5–5.1)
Sodium: 134 mmol/L — ABNORMAL LOW (ref 135–145)

## 2019-07-09 LAB — CBC
HCT: 38.7 % — ABNORMAL LOW (ref 39.0–52.0)
Hemoglobin: 13.2 g/dL (ref 13.0–17.0)
MCH: 30.3 pg (ref 26.0–34.0)
MCHC: 34.1 g/dL (ref 30.0–36.0)
MCV: 89 fL (ref 80.0–100.0)
Platelets: 201 10*3/uL (ref 150–400)
RBC: 4.35 MIL/uL (ref 4.22–5.81)
RDW: 12.4 % (ref 11.5–15.5)
WBC: 8.2 10*3/uL (ref 4.0–10.5)
nRBC: 0 % (ref 0.0–0.2)

## 2019-07-09 LAB — SURGICAL PCR SCREEN
MRSA, PCR: NEGATIVE
Staphylococcus aureus: NEGATIVE

## 2019-07-09 SURGERY — AMPUTATION DIGIT
Anesthesia: Monitor Anesthesia Care | Site: Foot | Laterality: Left

## 2019-07-09 MED ORDER — FENTANYL CITRATE (PF) 100 MCG/2ML IJ SOLN: 25.0000 ug | INTRAMUSCULAR | Status: DC | PRN

## 2019-07-09 MED ORDER — MIDAZOLAM HCL 2 MG/2ML IJ SOLN
INTRAMUSCULAR | Status: AC
  Administered 2019-07-09: 2 mg via INTRAVENOUS
  Filled 2019-07-09: qty 2

## 2019-07-09 MED ORDER — LACTATED RINGERS IV SOLN
INTRAVENOUS | Status: DC
  Administered 2019-07-09: 10:00:00 via INTRAVENOUS

## 2019-07-09 MED ORDER — OXYCODONE HCL 5 MG/5ML PO SOLN: 5.0000 mg | Freq: Once | ORAL | Status: DC | PRN

## 2019-07-09 MED ORDER — 0.9 % SODIUM CHLORIDE (POUR BTL) OPTIME
TOPICAL | Status: DC | PRN
  Administered 2019-07-09: 1000 mL

## 2019-07-09 MED ORDER — LIDOCAINE HCL (PF) 1 % IJ SOLN
INTRAMUSCULAR | Status: AC
  Filled 2019-07-09: qty 30

## 2019-07-09 MED ORDER — MEPERIDINE HCL 25 MG/ML IJ SOLN: 6.2500 mg | INTRAMUSCULAR | Status: DC | PRN

## 2019-07-09 MED ORDER — ACETAMINOPHEN 325 MG PO TABS: 325.0000 mg | ORAL_TABLET | ORAL | Status: DC | PRN

## 2019-07-09 MED ORDER — FENTANYL CITRATE (PF) 100 MCG/2ML IJ SOLN
INTRAMUSCULAR | Status: AC
  Administered 2019-07-09: 10:00:00 100 ug via INTRAVENOUS
  Filled 2019-07-09: qty 2

## 2019-07-09 MED ORDER — OXYCODONE HCL 5 MG PO TABS: 5.0000 mg | ORAL_TABLET | Freq: Once | ORAL | Status: DC | PRN

## 2019-07-09 MED ORDER — MIDAZOLAM HCL 2 MG/2ML IJ SOLN
2.0000 mg | Freq: Once | INTRAMUSCULAR | Status: AC
  Administered 2019-07-09: 10:00:00 2 mg via INTRAVENOUS
  Filled 2019-07-09: qty 2

## 2019-07-09 MED ORDER — DEXMEDETOMIDINE HCL 200 MCG/2ML IV SOLN
INTRAVENOUS | Status: DC | PRN
  Administered 2019-07-09: 12 ug via INTRAVENOUS
  Administered 2019-07-09: 8 ug via INTRAVENOUS

## 2019-07-09 MED ORDER — PROPOFOL 10 MG/ML IV BOLUS
INTRAVENOUS | Status: DC | PRN
  Administered 2019-07-09 (×3): 20 mg via INTRAVENOUS

## 2019-07-09 MED ORDER — ACETAMINOPHEN 160 MG/5ML PO SOLN: 325.0000 mg | ORAL | Status: DC | PRN

## 2019-07-09 MED ORDER — ROPIVACAINE HCL 7.5 MG/ML IJ SOLN
INTRAMUSCULAR | Status: DC | PRN
  Administered 2019-07-09: 30 mL via PERINEURAL

## 2019-07-09 MED ORDER — FENTANYL CITRATE (PF) 100 MCG/2ML IJ SOLN
100.0000 ug | Freq: Once | INTRAMUSCULAR | Status: AC
  Administered 2019-07-09: 10:00:00 100 ug via INTRAVENOUS
  Filled 2019-07-09: qty 2

## 2019-07-09 MED ORDER — PROPOFOL 500 MG/50ML IV EMUL
INTRAVENOUS | Status: DC | PRN
  Administered 2019-07-09: 50 ug/kg/min via INTRAVENOUS

## 2019-07-09 MED ORDER — ONDANSETRON HCL 4 MG/2ML IJ SOLN: 4.0000 mg | Freq: Once | INTRAMUSCULAR | Status: DC | PRN

## 2019-07-09 SURGICAL SUPPLY — 38 items
BNDG CMPR 75X41 PLY ABS (GAUZE/BANDAGES/DRESSINGS) ×1
BNDG ELASTIC 6X10 VLCR STRL LF (GAUZE/BANDAGES/DRESSINGS) ×2 IMPLANT
BNDG GAUZE ELAST 4 BULKY (GAUZE/BANDAGES/DRESSINGS) ×3 IMPLANT
BNDG STRETCH 4X75 NS LF (GAUZE/BANDAGES/DRESSINGS) ×2 IMPLANT
CANISTER SUCT 3000ML PPV (MISCELLANEOUS) ×3 IMPLANT
CLIP LIGATING EXTRA MED SLVR (CLIP) ×3 IMPLANT
CLIP LIGATING EXTRA SM BLUE (MISCELLANEOUS) ×3 IMPLANT
COVER SURGICAL LIGHT HANDLE (MISCELLANEOUS) ×6 IMPLANT
COVER WAND RF STERILE (DRAPES) ×3 IMPLANT
DRAPE EXTREMITY T 121X128X90 (DISPOSABLE) ×3 IMPLANT
ELECT REM PT RETURN 9FT ADLT (ELECTROSURGICAL) ×3
ELECTRODE REM PT RTRN 9FT ADLT (ELECTROSURGICAL) ×1 IMPLANT
GAUZE SPONGE 4X4 12PLY STRL (GAUZE/BANDAGES/DRESSINGS) ×3 IMPLANT
GAUZE SPONGE 4X4 12PLY STRL LF (GAUZE/BANDAGES/DRESSINGS) ×2 IMPLANT
GLOVE BIO SURGEON STRL SZ8 (GLOVE) ×4 IMPLANT
GLOVE BIOGEL PI IND STRL 7.0 (GLOVE) IMPLANT
GLOVE BIOGEL PI IND STRL 8 (GLOVE) IMPLANT
GLOVE BIOGEL PI INDICATOR 7.0 (GLOVE) ×2
GLOVE BIOGEL PI INDICATOR 8 (GLOVE) ×4
GLOVE SS BIOGEL STRL SZ 7.5 (GLOVE) ×1 IMPLANT
GLOVE SUPERSENSE BIOGEL SZ 7.5 (GLOVE) ×2
GOWN STRL REUS W/ TWL LRG LVL3 (GOWN DISPOSABLE) ×3 IMPLANT
GOWN STRL REUS W/TWL LRG LVL3 (GOWN DISPOSABLE) ×6
HOVERMATT SINGLE USE (MISCELLANEOUS) ×2 IMPLANT
KIT BASIN OR (CUSTOM PROCEDURE TRAY) ×3 IMPLANT
KIT TURNOVER KIT B (KITS) ×3 IMPLANT
NEEDLE 22X1 1/2 (OR ONLY) (NEEDLE) IMPLANT
NS IRRIG 1000ML POUR BTL (IV SOLUTION) ×3 IMPLANT
PACK GENERAL/GYN (CUSTOM PROCEDURE TRAY) ×3 IMPLANT
PAD ARMBOARD 7.5X6 YLW CONV (MISCELLANEOUS) ×6 IMPLANT
SUT ETHILON 2 0 PSLX (SUTURE) ×2 IMPLANT
SUT ETHILON 3 0 PS 1 (SUTURE) ×3 IMPLANT
SUT VIC AB 3-0 SH 27 (SUTURE) ×2
SUT VIC AB 3-0 SH 27X BRD (SUTURE) ×1 IMPLANT
SYR CONTROL 10ML LL (SYRINGE) IMPLANT
TOWEL GREEN STERILE (TOWEL DISPOSABLE) ×6 IMPLANT
UNDERPAD 30X30 (UNDERPADS AND DIAPERS) ×3 IMPLANT
WATER STERILE IRR 1000ML POUR (IV SOLUTION) ×3 IMPLANT

## 2019-07-09 NOTE — Interval H&P Note (Signed)
History and Physical Interval Note:  07/09/2019 9:08 AM  Adam Long  has presented today for surgery, with the diagnosis of GANGRENE OF TOE LEFT FOOT.  The various methods of treatment have been discussed with the patient and family. After consideration of risks, benefits and other options for treatment, the patient has consented to  Procedure(s): LEFT FOURTH TOE AMPUTATION (Left) as a surgical intervention.  The patient's history has been reviewed, patient examined, no change in status, stable for surgery.  I have reviewed the patient's chart and labs.  Questions were answered to the patient's satisfaction.     Curt Jews

## 2019-07-09 NOTE — Anesthesia Procedure Notes (Signed)
Anesthesia Regional Block: Popliteal block   Pre-Anesthetic Checklist: ,, timeout performed, Correct Patient, Correct Site, Correct Laterality, Correct Procedure, Correct Position, site marked, Risks and benefits discussed,  Surgical consent,  Pre-op evaluation,  At surgeon's request and post-op pain management  Laterality: Left  Prep: chloraprep       Needles:  Injection technique: Single-shot  Needle Type: Echogenic Stimulator Needle     Needle Length: 5cm  Needle Gauge: 22     Additional Needles:   Procedures:, nerve stimulator,,, ultrasound used (permanent image in chart),,,,   Nerve Stimulator or Paresthesia:  Response: gastroc, 0.45 mA,   Additional Responses:   Narrative:  Start time: 07/09/2019 9:49 AM End time: 07/09/2019 9:55 AM Injection made incrementally with aspirations every 5 mL.  Performed by: Personally  Anesthesiologist: Janeece Riggers, MD  Additional Notes: Functioning IV was confirmed and monitors were applied.  A 48mm 22ga Arrow echogenic stimulator needle was used. Sterile prep and drape,hand hygiene and sterile gloves were used. Ultrasound guidance: relevant anatomy identified, needle position confirmed, local anesthetic spread visualized around nerve(s)., vascular puncture avoided.  Image printed for medical record. Negative aspiration and negative test dose prior to incremental administration of local anesthetic. The patient tolerated the procedure well.

## 2019-07-09 NOTE — Progress Notes (Signed)
Virtual Visit via Telephone Note   This visit type was conducted due to national recommendations for restrictions regarding the COVID-19 Pandemic (e.g. social distancing) in an effort to limit this patient's exposure and mitigate transmission in our community.  Due to his co-morbid illnesses, this patient is at least at moderate risk for complications without adequate follow up.  This format is felt to be most appropriate for this patient at this time.  The patient did not have access to video technology/had technical difficulties with video requiring transitioning to audio format only (telephone).  All issues noted in this document were discussed and addressed.  No physical exam could be performed with this format.  Please refer to the patient's chart for his  consent to telehealth for [facility].   Date:  07/10/2019   ID:  Adam Long, DOB [birth year], MRN [id number]  Patient Location: Home Provider Location: Office  PCP:  Jacquelin Hawking, PA-C  Cardiologist:  Prentice Docker, MD  Electrophysiologist:  None   Evaluation Performed:  Follow-Up Visit  Chief Complaint: Hospitalization Follow-Up  History of Present Illness:    Adam Long is a 43 y.o. male with past medical history of HTN, HLD, Type II DM and aortic stenosis who presents for a hospital follow-up telehealth visit.  He was recently admitted to [facility] on 06/11/2019 after having developed swelling and erythema along his left foot after stepping on a tack for treatment of his left wound infection/cellulitis and started on Vancomycin and Zosyn. Was obtained as part of his work-up and showed a preserved EF of 55 to 60% but he did have severe calcifications along the aortic valve along with severe stenosis. He denied any recent chest pain or dyspnea on exertion and given his asymptomatic state, it was recommended he have office follow-up and that once he became symptomatic would plan for [facility] and referral to  Structural Heart. He was discharged home on 06/14/2019 and did follow-up with General Surgery in the outpatient setting and was referred to Vascular Surgery for further management.  He followed up with Dr. Arbie Cookey on 07/03/2019 and was noted to have gangrene of his left fourth toe. He underwent arteriography on 07/06/2019 by Dr. Darrick Penna and was found to have no significant arterial occlusive disease. He was admitted for further management and started on antibiotic therapy. Eventually underwent transmetatarsal amputation of left fourth toe and digital amputation of left fifth toe on 07/09/2019. Was discharged home earlier this morning.   In talking with the patient today, he reports being under increased stress given the events over the past month but says he has overall done well. He says the pain along his left foot has already started to improve.    In regards to his aortic stenosis, he reports having never been told he had a murmur prior to his hospitalization in 05/2019.  He is active at baseline and reports doing yard work outside regularly along with going hunting and denies any recent chest pain or dyspnea on exertion. No recent orthopnea, PND or lower extremity edema.  No palpitations, lightheadedness, dizziness or presyncope.  The patient does not have symptoms concerning for COVID-19 infection (fever, chills, cough, or new shortness of breath).    Past Medical History:  Diagnosis Date  . Anxiety   . Cholelithiasis   . Depression   . Diabetes mellitus   . Hypercholesteremia   . Hypertension   . Morbid obesity (HCC)    Past Surgical History:  Procedure Laterality Date  .  ABDOMINAL AORTOGRAM W/LOWER EXTREMITY N/A 07/06/2019   Procedure: ABDOMINAL AORTOGRAM W/LOWER EXTREMITY;  Surgeon: Elam Dutch, MD;  Location: [place] [facility];  Service: Cardiovascular;  Laterality: N/A;  Bilateral  . AMPUTATION Left 07/09/2019   Procedure: LEFT FOURTH and Fifth TOE AMPUTATION.;  Surgeon: Rosetta Posner, MD;  Location: [facility] OR;  Service: Vascular;  Laterality: Left;     Current Meds  Medication Sig  . aspirin EC 81 MG tablet Take 81 mg by mouth daily.  Marland Kitchen atorvastatin (LIPITOR) 20 MG tablet Take 1 tablet (20 mg total) by mouth daily.  . insulin aspart (NOVOLOG FLEXPEN) 100 UNIT/ML FlexPen Inject tid ac per ssi.   (SSI:  fbs  111-150 - 2 units, 151-200 - 4 units, 201-250 - 6 units, 251-300 - 8 units, 301-350 - 10 units , 351-400 - 12 units)  . Insulin Glargine (LANTUS SOLOSTAR) 100 UNIT/ML Solostar Pen Inject 10 Units into the skin daily. (Patient taking differently: Inject 10 Units into the skin at bedtime. )  . lisinopril (ZESTRIL) 20 MG tablet TAKE 1 Tablet BY MOUTH ONCE DAILY FOR BLOOD PRESSURE  . metoprolol tartrate (LOPRESSOR) 50 MG tablet Take 1 tablet (50 mg total) by mouth 2 (two) times daily.  Marland Kitchen oxyCODONE (OXY IR/ROXICODONE) 5 MG immediate release tablet Take 1 tablet (5 mg total) by mouth every 6 (six) hours as needed for moderate pain.  . sitaGLIPtin-metformin (JANUMET) 50-500 MG tablet Take 1 tablet by mouth 2 (two) times daily with a meal.     Allergies:   Pollen extract   Social History   Tobacco Use  . Smoking status: Never Smoker  . Smokeless tobacco: Former Systems developer    Types: Chew  Substance Use Topics  . Alcohol use: Yes    Comment: occasionally  . Drug use: No     Family Hx: The patient's family history includes Cancer in his mother; Heart disease in his father; Heart murmur in his sister; Hyperlipidemia in his father; Hypertension in his father; Stroke in his father.  ROS:   Please see the history of present illness.     All other systems reviewed and are negative.   Prior CV studies:   The following studies were reviewed today:  Echocardiogram: 06/12/2019 IMPRESSIONS   1. The left ventricle has normal systolic function, with an ejection fraction of 55-60%. The cavity size was normal. There is mildly increased left ventricular wall thickness. Left  ventricular diastolic Doppler parameters are consistent with  pseudonormalization. Elevated mean left atrial pressure.  2. The right ventricle has normal systolic function. The cavity was normal. There is no increase in right ventricular wall thickness.  3. The aortic valve has an indeterminate number of cusps. Severely thickening of the aortic valve. Severe calcifcation of the aortic valve. Severe stenosis of the aortic valve. Severe aortic annular calcification noted.  4. The mitral valve is abnormal. Moderate thickening of the mitral valve leaflet. Moderate calcification of the mitral valve leaflet. There is moderate mitral annular calcification present. No evidence of mitral valve stenosis.  5. The aorta is normal unless otherwise noted.  6. The aortic root is normal in size and structure.  7. Pulmonary hypertension is indeterminant, inadequate TR jet.  8. The interatrial septum was not well visualized.   Labs/Other Tests and Data Reviewed:    EKG:  No ECG reviewed.  Recent Labs: 06/28/2019: ALT 50 07/09/2019: BUN 8; Creatinine, Ser 0.69; Hemoglobin 13.2; Platelets 201; Potassium 3.7; Sodium 134   Recent Lipid  Panel Lab Results  Component Value Date/Time   CHOL 161 06/28/2019 09:39 AM   TRIG 148 06/28/2019 09:39 AM   HDL 43 06/28/2019 09:39 AM   CHOLHDL 3.7 06/28/2019 09:39 AM   LDLCALC 88 06/28/2019 09:39 AM    Wt Readings from Last 3 Encounters:  07/10/19 (!) 360 lb (163.3 kg)  07/06/19 (!) 368 lb (166.9 kg)  07/03/19 (!) 369 lb 11.2 oz (167.7 kg)     Objective:    Vital Signs:  BP 115/65   Ht 5\' 10"  (1.778 m)   Wt (!) 360 lb (163.3 kg)   BMI 51.65 kg/m    General: Pleasant male sounding in NAD Psych: Normal affect. Neuro: Alert and oriented X 3.  Lungs:  Resp regular and unlabored while talking on the phone.   ASSESSMENT & PLAN:    1. Aortic Stenosis - Recent echocardiogram last month showed severe aortic stenosis with a calculated valve area of 1.01 cm. He  has no prior studies available for comparison.  - He overall remains asymptomatic and denies any recent dyspnea on exertion, lightheadedness, dizziness or presyncope. Given his asymptomatic state and recent surgery for gangrene, would continue with surveillance echocardiograms at this time. Will plan for follow-up in 3-4 months and repeat echo in 6 months unless he develops any new symptoms in the interim. Will review with the Structural Heart Team to see if they have any additional recommendations. Reviewed the natural course of AS with the patient and that [facility].   2. HTN - BP has overall been well controlled, at 115/65 on most recent check.  Continue current regimen with Lisinopril 20 mg daily and Lopressor 50 mg twice daily.  3. HLD - Followed by PCP. FLP in 06/2019 showed total cholesterol 161, triglycerides 148, HDL 43 and LDL 88.  Continue Atorvastatin 20mg  daily.   4. Gangrene of Left Foot - followed by Vascular Surgery and recent arteriography on 07/06/2019 showed no significant arterial occlusive disease. He is now s/p transmetatarsal amputation of left fourth toe and digital amputation of left fifth toe which was performed on 07/09/2019.   5. Morbid Obesity - BMI elevated to 51.65. He plans to increase his physical activity once he recovers from his recent surgery.    COVID-19 Education: The signs and symptoms of COVID-19 were discussed with the patient and how to seek care for testing (follow up with PCP or arrange E-visit).  The importance of social distancing was discussed today.  Time:   Today, I have spent 18 minutes with the patient with telehealth technology discussing the above problems.     Medication Adjustments/Labs and Tests Ordered: Current medicines are reviewed at length with the patient today.  Concerns regarding medicines are outlined above.   Tests Ordered: No orders of the defined types were placed in this  encounter.   Medication Changes: No orders of the defined types were placed in this encounter.   Follow Up:  In Person in 3-4 months.   Signed, Ellsworth LennoxBrittany M Yetunde Leis, PA-C  07/10/2019 5:41 PM    [facility]

## 2019-07-09 NOTE — Anesthesia Postprocedure Evaluation (Signed)
Anesthesia Post Note  Patient: Adam Long  Procedure(s) Performed: LEFT FOURTH and Fifth TOE AMPUTATION. (Left Foot)     Patient location during evaluation: PACU Anesthesia Type: MAC Level of consciousness: awake and alert Pain management: pain level controlled Vital Signs Assessment: post-procedure vital signs reviewed and stable Respiratory status: spontaneous breathing, nonlabored ventilation, respiratory function stable and patient connected to nasal cannula oxygen Cardiovascular status: stable and blood pressure returned to baseline Postop Assessment: no apparent nausea or vomiting Anesthetic complications: no    Last Vitals:  Vitals:   07/09/19 1118 07/09/19 1133  BP: 118/79 121/71  Pulse: 79 66  Resp: 15 (!) 21  Temp: 36.4 C (!) 36.1 C  SpO2: 98% 100%    Last Pain:  Vitals:   07/09/19 1133  TempSrc:   PainSc: 0-No pain                 Alixandra Alfieri

## 2019-07-09 NOTE — Op Note (Signed)
    OPERATIVE REPORT  DATE OF SURGERY: 07/09/2019  PATIENT: Adam Long, 43 y.o. male MRN: [id number]  DOB: 1976/08/25  PRE-OPERATIVE DIAGNOSIS: Gangrene left fourth toe  POST-OPERATIVE DIAGNOSIS:  Same  PROCEDURE: Transmetatarsal amputation of left fourth toe digital amputation of left fifth toe  SURGEON:  Curt Jews, M.D.  PHYSICIAN ASSISTANT: Nurse  ANESTHESIA: Popliteal block and sedation  EBL: per anesthesia record  Total I/O In: 120 [P.O.:120] Out: -   BLOOD ADMINISTERED: none  DRAINS: none  SPECIMEN: none  COUNTS CORRECT:  YES  PATIENT DISPOSITION:  PACU - hemodynamically stable  PROCEDURE DETAILS: Patient was taken the operating placed to position of the area of the left foot prepped draped you sterile fashion.  Incision was made at the base of the left fourth toe and carried down to excise all necrotic tissue.  This did extend down to the bone.  The metatarsal head was resected as well.  There was no evidence of involvement of the third or fifth metatarsal head.  I discussed with the patient before surgery the issue regarding mechanical difficulty of having 1/5 toe after her fourth amputation and the difficulty with this catching on socks and other objects.  I recommended 1/5 toe amputation he agreed.  The fifth amputation was amputated through the digit and the metatarsal head was not taken.  The fifth mouth and incision was made at the base of the fifth toe.  The digital bone was transected.  There was good bleeding at both the fourth and fifth toe amputation sites.  The metatarsal head on the fourth digit was resected with rongeur.  Bleeding was controlled with electrocautery.  The wound was irrigated with saline.  Hemostasis attained with cautery.  The fifth toe was closed with interrupted 3-0 nylon mattress sutures.  The fourth toe was not possible to be completely closed.  The deep tissues were closed with 2-0 nylon mattress sutures and the surface was  partially closed with a 2 one 2-0 nylon mattress suture.  A Betadine soaked 4 x 4 was placed in the base of the fourth toe amputation and a sterile dressing was applied.  The patient was transferred to the recovery room in stable condition   Rosetta Posner, M.D., [facility] 07/09/2019 11:54 AM

## 2019-07-09 NOTE — Progress Notes (Signed)
Orthopedic Tech Progress Note Patient Details:  Adam Long 08-07-1976 [id number]  Ortho Devices Type of Ortho Device: Darco shoe Ortho Device/Splint Location: left Ortho Device/Splint Interventions: Application   Post Interventions Patient Tolerated: Well Instructions Provided: Care of device   [name] 07/09/2019, 4:18 PM

## 2019-07-09 NOTE — Transfer of Care (Signed)
Immediate Anesthesia Transfer of Care Note  Patient: Adam Long  Procedure(s) Performed: LEFT FOURTH and Fifth TOE AMPUTATION. (Left Foot)  Patient Location: PACU  Anesthesia Type:MAC  Level of Consciousness: awake  Airway & Oxygen Therapy: Patient Spontanous Breathing and Patient connected to face mask oxygen  Post-op Assessment: Report given to RN and Post -op Vital signs reviewed and stable  Post vital signs: Reviewed and stable  Last Vitals:  Vitals Value Taken Time  BP 100/81 07/09/19 1217  Temp 36.1 C 07/09/19 1133  Pulse 66 07/09/19 1136  Resp 18 07/09/19 1231  SpO2 100 % 07/09/19 1136  Vitals shown include unvalidated device data.  Last Pain:  Vitals:   07/09/19 1133  TempSrc:   PainSc: 0-No pain      Patients Stated Pain Goal: 3 (68/12/75 1700)  Complications: No apparent anesthesia complications

## 2019-07-09 NOTE — Anesthesia Preprocedure Evaluation (Addendum)
Anesthesia Evaluation  Patient identified by MRN, date of birth, ID band Patient awake    Reviewed: Allergy & Precautions, H&P , NPO status , Patient's Chart, lab work & pertinent test results, reviewed documented beta blocker date and time   Airway Mallampati: IV  TM Distance: >3 FB Neck ROM: full  Mouth opening: Limited Mouth Opening  Dental no notable dental hx. (+) Poor Dentition, Missing, Loose, Chipped, Dental Advisory Given   Pulmonary neg pulmonary ROS,    Pulmonary exam normal breath sounds clear to auscultation       Cardiovascular Exercise Tolerance: Good hypertension, Pt. on medications and Pt. on home beta blockers + angina + Valvular Problems/Murmurs AS  Rhythm:regular Rate:Normal  Echo 9/20   1. The left ventricle has normal systolic function, with an ejection fraction of 55-60%. The cavity size was normal. There is mildly increased left ventricular wall thickness. Left ventricular diastolic Doppler parameters are consistent with  pseudonormalization. Elevated mean left atrial pressure.  2. The right ventricle has normal systolic function. The cavity was normal. There is no increase in right ventricular wall thickness.  3. The aortic valve has an indeterminate number of cusps. Severely thickening of the aortic valve. Severe calcifcation of the aortic valve. Severe stenosis of the aortic valve. Severe aortic annular calcification noted.  4. The mitral valve is abnormal. Moderate thickening of the mitral valve leaflet. Moderate calcification of the mitral valve leaflet. There is moderate mitral annular calcification present. No evidence of mitral valve stenosis.   Neuro/Psych negative neurological ROS  negative psych ROS   GI/Hepatic negative GI ROS, Neg liver ROS,   Endo/Other  diabetesMorbid obesity  Renal/GU negative Renal ROS  negative genitourinary   Musculoskeletal   Abdominal   Peds   Hematology negative hematology ROS (+)   Anesthesia Other Findings   Reproductive/Obstetrics negative OB ROS                            Anesthesia Physical Anesthesia Plan  ASA: III  Anesthesia Plan: MAC   Post-op Pain Management:  Regional for Post-op pain   Induction:   PONV Risk Score and Plan: 1 and Ondansetron  Airway Management Planned: Nasal Cannula and Simple Face Mask  Additional Equipment:   Intra-op Plan:   Post-operative Plan:   Informed Consent: I have reviewed the patients History and Physical, chart, labs and discussed the procedure including the risks, benefits and alternatives for the proposed anesthesia with the patient or authorized representative who has indicated his/her understanding and acceptance.     Dental Advisory Given  Plan Discussed with: CRNA and Anesthesiologist  Anesthesia Plan Comments:       Anesthesia Quick Evaluation

## 2019-07-10 ENCOUNTER — Encounter (HOSPITAL_COMMUNITY): Payer: Self-pay | Admitting: Vascular Surgery

## 2019-07-10 ENCOUNTER — Telehealth (INDEPENDENT_AMBULATORY_CARE_PROVIDER_SITE_OTHER): Payer: Self-pay | Admitting: Student

## 2019-07-10 VITALS — BP 115/65 | Ht 70.0 in | Wt 360.0 lb

## 2019-07-10 DIAGNOSIS — I10 Essential (primary) hypertension: Secondary | ICD-10-CM

## 2019-07-10 DIAGNOSIS — I96 Gangrene, not elsewhere classified: Secondary | ICD-10-CM

## 2019-07-10 DIAGNOSIS — I350 Nonrheumatic aortic (valve) stenosis: Secondary | ICD-10-CM

## 2019-07-10 DIAGNOSIS — E782 Mixed hyperlipidemia: Secondary | ICD-10-CM

## 2019-07-10 LAB — GLUCOSE, CAPILLARY
Glucose-Capillary: 165 mg/dL — ABNORMAL HIGH (ref 70–99)
Glucose-Capillary: 195 mg/dL — ABNORMAL HIGH (ref 70–99)

## 2019-07-10 MED ORDER — OXYCODONE HCL 5 MG PO TABS: 5.0000 mg | ORAL_TABLET | Freq: Four times a day (QID) | ORAL | 0 refills | Status: DC | PRN

## 2019-07-10 NOTE — Progress Notes (Addendum)
[facility] [place]  Subjective  - Doing well without new complaints.   Objective 113/70 67 98.7 F (37.1 C) (Oral) 17 100%  Intake/Output Summary (Last 24 hours) at 07/10/2019 0734 Last data filed at 07/09/2019 1500 Gross per 24 hour  Intake 860 ml  Output 575 ml  Net 285 ml    Left PT/peroneal/AT signals Left 4th and 5th amputation sites healing well, minimal bloody drainage.  Dry dressing applied to incision. Lungs non labored breathing  Assessment/Planning: POD # 1 Transmetatarsal amputation of left fourth toe digital amputation of left fifth toe  Post angiogram: left anterior tibial posterior tibial and peroneal arteries are all widely patent.   Plan to discharge home today with [facility] RN to check the wound.    He will f/u with DR. Early next week.  Roxy Horseman 07/10/2019 7:34 AM --  Laboratory Lab Results: Recent Labs    07/09/19 0424  WBC 8.2  HGB 13.2  HCT 38.7*  PLT 201   BMET Recent Labs    07/08/19 0435 07/09/19 0424  NA 135 134*  K 3.8 3.7  CL 100 100  CO2 25 24  GLUCOSE 158* 171*  BUN 9 8  CREATININE 0.73 0.69  CALCIUM 9.0 8.6*    COAG Lab Results  Component Value Date   INR 1.1 06/11/2019   No results found for: PTT \  I have examined the patient, reviewed and agree with above.  Doing well overall.  Okay for discharge today.  Walking with a Darco shoe.  I will see him in 1 week for outpatient follow-up  Curt Jews, MD 07/10/2019 8:05 AM

## 2019-07-10 NOTE — TOC Transition Note (Signed)
Transition of Care [facility]) - CM/SW Discharge Note Marvetta Gibbons RN, BSN Transitions of Care Unit 4E- RN Case Manager [phone]   Patient Details  Name: Adam Long MRN: [id number] Date of Birth: [birth year]  Transition of Care [facility]) CM/SW Contact:  Dawayne Patricia, RN Phone Number: 07/10/2019, 10:11 AM   Clinical Narrative:    Pt stable for transition home today, order for [facility] placed- CM spoke with pt at bedside regarding charity care program for [facility] needs. Pt confirmed he does not have insurance at this time. Pt reports that he does have a PCP [facility]- and has filled out paperwork to re-establish with the Med Assist program for medication assistance needs. Address and phone # confirmed in epic. Pt has transportation home. Referral called to [facility] agency for this week) for [facility] needs- referral accepted and they will f/u to see if pt qualifies for the charity program for [facility] needs.    Final next level of care: [place] Barriers to Discharge: No Barriers Identified   Patient Goals and CMS Choice Patient states their goals for this hospitalization and ongoing recovery are:: to be able to get around better CMS Medicare.gov Compare Post Acute Care list provided to:: Patient Choice offered to / list presented to : NA(Charity program)  Discharge Placement         Home with [facility]              Discharge Plan and Services   Discharge Planning Services: CM Consult Post Acute Care Choice: Home Health          DME Arranged: N/A DME Agency: NA       HH Arranged: RN [place] Agency: [place] Date [place]: 07/10/19 Time HH Agency Contacted: 1000 Representative spoke with at [place]: [place] ([place]) Interventions     Readmission Risk Interventions Readmission Risk Prevention Plan 07/10/2019  Post Dischage Appt Complete  Medication Screening Complete   Transportation Screening Complete  Some recent data might be hidden

## 2019-07-10 NOTE — Patient Instructions (Signed)
Medication Instructions:  Your physician recommends that you continue on your current medications as directed. Please refer to the Current Medication list given to you today.  If you need a refill on your cardiac medications before your next appointment, please call your pharmacy.   Lab work: NONE  If you have labs (blood work) drawn today and your tests are completely normal, you will receive your results only by: Marland Kitchen MyChart Message (if you have MyChart) OR . A paper copy in the mail If you have any lab test that is abnormal or we need to change your treatment, we will call you to review the results.  Testing/Procedures: NONE  Follow-Up: At [facility], you and your health needs are our priority.  As part of our continuing mission to provide you with exceptional heart care, we have created designated Provider Care Teams.  These Care Teams include your primary Cardiologist (physician) and Advanced Practice Providers (APPs -  Physician Assistants and Nurse Practitioners) who all work together to provide you with the care you need, when you need it. You will need a follow up appointment in 3-4 months.  Please call our office 2 months in advance to schedule this appointment.  You may see Kate Sable, MD or one of the following Advanced Practice Providers on your designated Care Team:   Bernerd Pho, PA-C [facility]) . Ermalinda Barrios, PA-C ([place])  Any Other Special Instructions Will Be Listed Below (If Applicable). Thank you for choosing [facility] [place]!

## 2019-07-11 NOTE — Discharge Summary (Signed)
[facility] Discharge Summary   Patient ID:  Adam Long MRN: [id number] DOB/AGE: [birth year] 43 y.o.  Admit date: 07/06/2019 Discharge date:07/10/2019 Date of Surgery: 07/09/2019 Surgeon: Surgeon(s): Early, Arvilla Meres, MD  Admission Diagnosis: Gangrene of toe of left foot [facility]) [I96]  Discharge Diagnoses:  Gangrene of toe of left foot ([place]) [I96]  Secondary Diagnoses: Past Medical History:  Diagnosis Date  . Anxiety   . Cholelithiasis   . Depression   . Diabetes mellitus   . Hypercholesteremia   . Hypertension   . Morbid obesity ([place])     Procedure(s): LEFT FOURTH and Fifth TOE AMPUTATION.  Discharged Condition: good  HPI: Adam Long is a 43 y.o. male, who is here today for evaluation of gangrenous changes of his left foot.  He is severe poorly controlled type II diabetic with morbid obesity with a BMI of greater than 53.  He apparently stepped on a roofing nail in early September and has had persistent difficulty with healing.  He was admitted to [facility] on 914 11/05/2014 for antibiotics.  He does have a history of severe aortic stenosis.  MRI of his left foot from 06/13/2019 was reviewed as well showing no evidence of abscess at that time.    He was scheduled for left 4th and 5th toe amputation.   Hospital Course:  Adam Long is a 43 y.o. male is S/P  Procedure(s): LEFT FOURTH and Fifth TOE AMPUTATION.  Clean edges s/p amputation of 4th and 5th toes.  He is independently ambulating.  He has been able to help with his own dressing changes and he will be heel weight bearing in a Darco shoe.  Will have RN wound checks.  He will f/u next week with DR. Early.   Consults:  Treatment Team:  Rosetta Posner, MD  Significant Diagnostic Studies: CBC Lab Results  Component Value Date   WBC 8.2 07/09/2019   HGB 13.2 07/09/2019   HCT 38.7 (L) 07/09/2019   MCV 89.0 07/09/2019   PLT 201 07/09/2019    BMET    Component Value  Date/Time   NA 134 (L) 07/09/2019 0424   K 3.7 07/09/2019 0424   CL 100 07/09/2019 0424   CO2 24 07/09/2019 0424   GLUCOSE 171 (H) 07/09/2019 0424   BUN 8 07/09/2019 0424   CREATININE 0.69 07/09/2019 0424   CREATININE 0.77 01/07/2017 1040   CALCIUM 8.6 (L) 07/09/2019 0424   GFRNONAA >60 07/09/2019 0424   GFRNONAA >89 04/08/2016 0903   GFRAA >60 07/09/2019 0424   GFRAA >89 04/08/2016 0903   COAG Lab Results  Component Value Date   INR 1.1 06/11/2019     Disposition:  Discharge to :Home Discharge Instructions    Call MD for:  redness, tenderness, or signs of infection (pain, swelling, bleeding, redness, odor or green/yellow discharge around incision site)   Complete by: As directed    Call MD for:  severe or increased pain, loss or decreased feeling  in affected limb(s)   Complete by: As directed    Call MD for:  temperature >100.5   Complete by: As directed    Discharge instructions   Complete by: As directed    Heel weight bearing left foot in Darco shoe, you may shower as needed.  Wet to dry dressing changes daily.  Elevate your feet when at rest.   Resume previous diet   Complete by: As directed      Allergies as of  07/10/2019      Reactions   Pollen Extract Other (See Comments)   Runny nose, watery eyes, sneezing      Medication List    TAKE these medications   aspirin EC 81 MG tablet Take 81 mg by mouth daily.   atorvastatin 20 MG tablet Commonly known as: LIPITOR Take 1 tablet (20 mg total) by mouth daily.   Lantus SoloStar 100 UNIT/ML Solostar Pen Generic drug: Insulin Glargine Inject 10 Units into the skin daily. What changed: when to take this   lisinopril 20 MG tablet Commonly known as: ZESTRIL TAKE 1 Tablet BY MOUTH ONCE DAILY FOR BLOOD PRESSURE   metoprolol tartrate 50 MG tablet Commonly known as: LOPRESSOR Take 1 tablet (50 mg total) by mouth 2 (two) times daily.   NovoLOG FlexPen 100 UNIT/ML FlexPen Generic drug: insulin aspart Inject  tid ac per ssi.   (SSI:  fbs  111-150 - 2 units, 151-200 - 4 units, 201-250 - 6 units, 251-300 - 8 units, 301-350 - 10 units , 351-400 - 12 units)   oxyCODONE 5 MG immediate release tablet Commonly known as: Oxy IR/ROXICODONE Take 1 tablet (5 mg total) by mouth every 6 (six) hours as needed for moderate pain.   sitaGLIPtin-metformin 50-500 MG tablet Commonly known as: JANUMET Take 1 tablet by mouth 2 (two) times daily with a meal.      Verbal and written Discharge instructions given to the patient. Wound care per Discharge AVS Follow-up Information    Early, Kristen Loader, MD Follow up in 1 week(s).   Specialties: Vascular Surgery, Cardiology Why: office will call Contact information: [address] [place] [address] [phone]        Care, [facility] Follow up.   Specialty: Home Health Services Why: HHRN arranged for wound checks- they will call you to see if you qualify for charity care and set up visits Contact information: [address] [place] [address] [phone]           Signed: Mosetta Pigeon 07/11/2019, 10:32 AM

## 2019-07-13 ENCOUNTER — Other Ambulatory Visit: Payer: Self-pay

## 2019-07-13 ENCOUNTER — Emergency Department (HOSPITAL_COMMUNITY)
Admission: EM | Admit: 2019-07-13 | Discharge: 2019-07-13 | Disposition: A | Discharge status: Home / Self Care | Payer: Self-pay | Attending: Emergency Medicine | Admitting: Emergency Medicine

## 2019-07-13 ENCOUNTER — Emergency Department (HOSPITAL_COMMUNITY): Payer: Self-pay

## 2019-07-13 ENCOUNTER — Encounter (HOSPITAL_COMMUNITY): Payer: Self-pay | Admitting: *Deleted

## 2019-07-13 DIAGNOSIS — I10 Essential (primary) hypertension: Secondary | ICD-10-CM | POA: Insufficient documentation

## 2019-07-13 DIAGNOSIS — Z794 Long term (current) use of insulin: Secondary | ICD-10-CM | POA: Insufficient documentation

## 2019-07-13 DIAGNOSIS — Z7982 Long term (current) use of aspirin: Secondary | ICD-10-CM | POA: Insufficient documentation

## 2019-07-13 DIAGNOSIS — Z79899 Other long term (current) drug therapy: Secondary | ICD-10-CM | POA: Insufficient documentation

## 2019-07-13 DIAGNOSIS — R55 Syncope and collapse: Secondary | ICD-10-CM | POA: Insufficient documentation

## 2019-07-13 DIAGNOSIS — E119 Type 2 diabetes mellitus without complications: Secondary | ICD-10-CM | POA: Insufficient documentation

## 2019-07-13 LAB — BASIC METABOLIC PANEL
Anion gap: 9 (ref 5–15)
BUN: 11 mg/dL (ref 6–20)
CO2: 25 mmol/L (ref 22–32)
Calcium: 9.3 mg/dL (ref 8.9–10.3)
Chloride: 97 mmol/L — ABNORMAL LOW (ref 98–111)
Creatinine, Ser: 0.72 mg/dL (ref 0.61–1.24)
GFR calc Af Amer: >60 mL/min (ref >60)
GFR calc non Af Amer: >60 mL/min (ref >60)
Glucose, Bld: 195 mg/dL — ABNORMAL HIGH (ref 70–99)
Potassium: 4 mmol/L (ref 3.5–5.1)
Sodium: 131 mmol/L — ABNORMAL LOW (ref 135–145)

## 2019-07-13 LAB — CBC WITH DIFFERENTIAL/PLATELET
Abs Immature Granulocytes: 0.1 10*3/uL — ABNORMAL HIGH (ref 0.00–0.07)
Basophils Absolute: 0.1 10*3/uL (ref 0.0–0.1)
Basophils Relative: 1 %
Eosinophils Absolute: 0.3 10*3/uL (ref 0.0–0.5)
Eosinophils Relative: 2 %
HCT: 42.5 % (ref 39.0–52.0)
Hemoglobin: 13.7 g/dL (ref 13.0–17.0)
Immature Granulocytes: 1 %
Lymphocytes Relative: 19 %
Lymphs Abs: 2.2 10*3/uL (ref 0.7–4.0)
MCH: 28.9 pg (ref 26.0–34.0)
MCHC: 32.2 g/dL (ref 30.0–36.0)
MCV: 89.7 fL (ref 80.0–100.0)
Monocytes Absolute: 0.7 10*3/uL (ref 0.1–1.0)
Monocytes Relative: 6 %
Neutro Abs: 8.1 10*3/uL — ABNORMAL HIGH (ref 1.7–7.7)
Neutrophils Relative %: 71 %
Platelets: 244 10*3/uL (ref 150–400)
RBC: 4.74 MIL/uL (ref 4.22–5.81)
RDW: 12.5 % (ref 11.5–15.5)
WBC: 11.4 10*3/uL — ABNORMAL HIGH (ref 4.0–10.5)
nRBC: 0 % (ref 0.0–0.2)

## 2019-07-13 LAB — URINALYSIS, ROUTINE W REFLEX MICROSCOPIC
Bilirubin Urine: NEGATIVE
Glucose, UA: NEGATIVE mg/dL
Hgb urine dipstick: NEGATIVE
Ketones, ur: NEGATIVE mg/dL
Leukocytes,Ua: NEGATIVE
Nitrite: NEGATIVE
Protein, ur: NEGATIVE mg/dL
Specific Gravity, Urine: 1.002 — ABNORMAL LOW (ref 1.005–1.030)
pH: 6 (ref 5.0–8.0)

## 2019-07-13 LAB — CBG MONITORING, ED: Glucose-Capillary: 197 mg/dL — ABNORMAL HIGH (ref 70–99)

## 2019-07-13 NOTE — ED Provider Notes (Signed)
[facility] EMERGENCY DEPARTMENT Provider Note   CSN: [id number] Arrival date & time: 07/13/19  1304     History   Chief Complaint Chief Complaint  Patient presents with  . Near Syncope    HPI Adam Long is a 43 y.o. male.     Patient presenting by EMS.  Patient felt jittery and a little bit of dizziness no room spinning started 2 hours ago at home.  No loss of consciousness patient did not fall.  Feels better now.  Denies any new or worse chest pain had just a slight kind of chest tingling or maybe a mild discomfort.  Patient has known aortic stenosis.  Followed closely by cardiology recently seen by them.  Next follow-up is in 3 to 4 months.  Patient has been cautioned about developing symptoms.  No new or worsening chest pain.  Patient did not pass out.  Patient also just status post amputation of left fourth and fifth toe by Dr. Arbie Cookey for infection on October 12.  Patient tolerated that procedure well.  Has no concerns about the foot or wound.  Patient currently without any significant symptoms.     Past Medical History:  Diagnosis Date  . Anxiety   . Cholelithiasis   . Depression   . Diabetes mellitus   . Hypercholesteremia   . Hypertension   . Morbid obesity [facility])     Patient Active Problem List   Diagnosis Date Noted  . Gangrene of toe of left foot (HCC) 07/06/2019  . Cellulitis of fourth toe of left foot   . Cellulitis and abscess of lower extremity 06/11/2019  . Personal history of noncompliance with medical treatment, presenting hazards to health 03/16/2018  . Uncontrolled type 2 diabetes mellitus with complication (HCC) 10/12/2015  . Mixed hyperlipidemia 10/12/2015  . Morbid obesity (HCC) 10/12/2015  . Essential hypertension, benign 10/12/2015  . Chest pain 07/02/2013  . HTN (hypertension) 07/02/2013  . DM (diabetes mellitus) (HCC) 07/02/2013    Past Surgical History:  Procedure Laterality Date  . ABDOMINAL AORTOGRAM W/LOWER EXTREMITY N/A  07/06/2019   Procedure: ABDOMINAL AORTOGRAM W/LOWER EXTREMITY;  Surgeon: Sherren Kerns, MD;  Location: [facility] INVASIVE CV LAB;  Service: Cardiovascular;  Laterality: N/A;  Bilateral  . AMPUTATION Left 07/09/2019   Procedure: LEFT FOURTH and Fifth TOE AMPUTATION.;  Surgeon: Larina Earthly, MD;  Location: [facility] OR;  Service: Vascular;  Laterality: Left;        Home Medications    Prior to Admission medications   Medication Sig Start Date End Date Taking? Authorizing Provider  aspirin EC 81 MG tablet Take 81 mg by mouth daily.    [provider]  atorvastatin (LIPITOR) 20 MG tablet Take 1 tablet (20 mg total) by mouth daily. 06/07/19   Jacquelin Hawking, PA-C  insulin aspart (NOVOLOG FLEXPEN) 100 UNIT/ML FlexPen Inject tid ac per ssi.   (SSI:  fbs  111-150 - 2 units, 151-200 - 4 units, 201-250 - 6 units, 251-300 - 8 units, 301-350 - 10 units , 351-400 - 12 units) 07/02/19   Jacquelin Hawking, PA-C  Insulin Glargine (LANTUS SOLOSTAR) 100 UNIT/ML Solostar Pen Inject 10 Units into the skin daily. Patient taking differently: Inject 10 Units into the skin at bedtime.  07/02/19   Jacquelin Hawking, PA-C  lisinopril (ZESTRIL) 20 MG tablet TAKE 1 Tablet BY MOUTH ONCE DAILY FOR BLOOD PRESSURE 06/07/19   Jacquelin Hawking, PA-C  metoprolol tartrate (LOPRESSOR) 50 MG tablet Take 1 tablet (50 mg total) by  mouth 2 (two) times daily. 06/07/19   Soyla Dryer, PA-C  oxyCODONE (OXY IR/ROXICODONE) 5 MG immediate release tablet Take 1 tablet (5 mg total) by mouth every 6 (six) hours as needed for moderate pain. 07/10/19   Ulyses Amor, PA-C  sitaGLIPtin-metformin (JANUMET) 50-500 MG tablet Take 1 tablet by mouth 2 (two) times daily with a meal.    [provider]    Family History Family History  Problem Relation Age of Onset  . Cancer Mother        brain  . Heart disease Father   . Hyperlipidemia Father   . Hypertension Father   . Stroke Father   . Heart murmur Sister     Social History  Social History   Tobacco Use  . Smoking status: Never Smoker  . Smokeless tobacco: Former Systems developer    Types: Chew  Substance Use Topics  . Alcohol use: Yes    Comment: occasionally  . Drug use: No     Allergies   Pollen extract   Review of Systems Review of Systems  Constitutional: Negative for chills and fever.  HENT: Negative for congestion, rhinorrhea and sore throat.   Eyes: Negative for visual disturbance.  Respiratory: Negative for cough and shortness of breath.   Cardiovascular: Negative for chest pain and leg swelling.  Gastrointestinal: Negative for abdominal pain, diarrhea, nausea and vomiting.  Genitourinary: Negative for dysuria.  Musculoskeletal: Negative for back pain and neck pain.  Skin: Positive for wound. Negative for rash.  Neurological: Positive for dizziness. Negative for syncope, light-headedness and headaches.  Hematological: Does not bruise/bleed easily.  Psychiatric/Behavioral: Negative for confusion.     Physical Exam Updated Vital Signs BP (!) 142/84 (BP Location: Right Arm)   Pulse 70   Temp 98.2 F (36.8 C) (Oral)   Resp 18   Ht 1.778 m (5\' 10" )   Wt (!) 165.6 kg   SpO2 100%   BMI 52.37 kg/m   Physical Exam Vitals signs and nursing note reviewed.  Constitutional:      Appearance: Normal appearance. He is well-developed.  HENT:     Head: Normocephalic and atraumatic.  Eyes:     Extraocular Movements: Extraocular movements intact.     Conjunctiva/sclera: Conjunctivae normal.     Pupils: Pupils are equal, round, and reactive to light.  Neck:     Musculoskeletal: Normal range of motion and neck supple.  Cardiovascular:     Rate and Rhythm: Normal rate and regular rhythm.     Heart sounds: Murmur present.  Pulmonary:     Effort: Pulmonary effort is normal. No respiratory distress.     Breath sounds: Normal breath sounds.  Abdominal:     Palpations: Abdomen is soft.     Tenderness: There is no abdominal tenderness.   Musculoskeletal:     Comments: Left foot in a walking shoe or boot.  Dressing without any significant drainage.  Did not remove the dressing.  Skin:    General: Skin is warm and dry.  Neurological:     Mental Status: He is alert.      ED Treatments / Results  Labs (all labs ordered are listed, but only abnormal results are displayed) Labs Reviewed  BASIC METABOLIC PANEL - Abnormal; Notable for the following components:      Result Value   Sodium 131 (*)    Chloride 97 (*)    Glucose, Bld 195 (*)    All other components within normal limits  URINALYSIS, ROUTINE  W REFLEX MICROSCOPIC - Abnormal; Notable for the following components:   Color, Urine STRAW (*)    Specific Gravity, Urine 1.002 (*)    All other components within normal limits  CBC WITH DIFFERENTIAL/PLATELET - Abnormal; Notable for the following components:   WBC 11.4 (*)    Neutro Abs 8.1 (*)    Abs Immature Granulocytes 0.10 (*)    All other components within normal limits  CBG MONITORING, ED - Abnormal; Notable for the following components:   Glucose-Capillary 197 (*)    All other components within normal limits    EKG EKG Interpretation  Date/Time:  Friday July 13 2019 13:20:24 EDT Ventricular Rate:  80 PR Interval:  158 QRS Duration: 104 QT Interval:  410 QTC Calculation: 472 R Axis:   77 Text Interpretation:  Normal sinus rhythm Normal ECG Confirmed by Vanetta MuldersZackowski, Kyerra Vargo [phone]([id number]) on 07/13/2019 3:55:36 PM   Radiology Dg Chest 2 View  Result Date: 07/13/2019 CLINICAL DATA:  Near syncope EXAM: CHEST - 2 VIEW COMPARISON:  Radiograph 06/11/2019 FINDINGS: No consolidation, features of edema, pneumothorax, or effusion. Pulmonary vascularity is normally distributed. The cardiomediastinal contours are unremarkable. No acute osseous or soft tissue abnormality. IMPRESSION: No acute cardiopulmonary abnormality. Electronically Signed   By: Kreg ShropshirePrice  DeHay M.D.   On: 07/13/2019 18:29    Procedures Procedures  (including critical care time)  Medications Ordered in ED Medications - No data to display   Initial Impression / Assessment and Plan / ED Course  I have reviewed the triage vital signs and the nursing notes.  Pertinent labs & imaging results that were available during my care of the patient were reviewed by me and considered in my medical decision making (see chart for details).         Work-up here in the emergency department without any significant abnormalities.  Patient currently asymptomatic.  Labs without significant changes chest x-ray negative.   Cardiac monitoring without arrhythmia.  EKG without acute changes.  Patient given precautions that if he passes out or gets chest pain with exertion to get seen immediately.  Also recommend that he let cardiology be aware of today's events.  Do not feel that he needs admission.  Patient nontoxic no acute distress.   Final Clinical Impressions(s) / ED Diagnoses   Final diagnoses:  Near syncope    ED Discharge Orders    None       Vanetta MuldersZackowski, Isidro Monks, MD 07/13/19 1926

## 2019-07-13 NOTE — Discharge Instructions (Addendum)
Work-up for the near syncope without any acute findings.  Make an appointment to follow-up with your regular doctor.  Also touch base with cardiology regarding your symptoms.  Return for any new or worse symptoms.

## 2019-07-13 NOTE — ED Notes (Signed)
Patient transported to X-ray 

## 2019-07-13 NOTE — ED Triage Notes (Signed)
Patient presents to the ED via [facility] due to "jitteriness" and dizziness beginning 2 hours ago at home.  Patient has not fallen or LOC.  Patient has history of cardiac stenosis one month ago and is seen by cardiologist in [place].  Patient has appointment scheduled for this December.  Per EMS cbg is 214.

## 2019-07-16 ENCOUNTER — Other Ambulatory Visit: Payer: Self-pay | Admitting: Physician Assistant

## 2019-07-17 ENCOUNTER — Ambulatory Visit (INDEPENDENT_AMBULATORY_CARE_PROVIDER_SITE_OTHER): Payer: Self-pay | Admitting: Vascular Surgery

## 2019-07-17 ENCOUNTER — Other Ambulatory Visit: Payer: Self-pay

## 2019-07-17 ENCOUNTER — Encounter: Payer: Self-pay | Admitting: Vascular Surgery

## 2019-07-17 VITALS — BP 119/80 | HR 90 | Temp 97.8°F | Resp 20 | Ht 70.0 in | Wt 380.0 lb

## 2019-07-17 DIAGNOSIS — I96 Gangrene, not elsewhere classified: Secondary | ICD-10-CM

## 2019-07-17 NOTE — Progress Notes (Signed)
   Patient name: Adam Long MRN: [id number] DOB: [birth year] Sex: male  REASON FOR VISIT: Follow-up fourth and fifth toe amputation left foot on 07/09/2019  HPI: Adam Long is a 43 y.o. male here today for follow-up.  Had presented with gangrene of his left fourth toe involving the metatarsal head.  He had stepped on attack.  He had noncompressible vessels on noninvasive studies and therefore went underwent arteriography which showed no evidence of occlusive disease to his foot.  He was discharged home on postoperative day 1.  He has done well.  He is walking with a Darco shoe.  Current Outpatient Medications  Medication Sig Dispense Refill  . aspirin EC 81 MG tablet Take 81 mg by mouth daily.    Marland Kitchen atorvastatin (LIPITOR) 20 MG tablet Take 1 tablet by mouth once daily 30 tablet 0  . insulin aspart (NOVOLOG FLEXPEN) 100 UNIT/ML FlexPen Inject tid ac per ssi.   (SSI:  fbs  111-150 - 2 units, 151-200 - 4 units, 201-250 - 6 units, 251-300 - 8 units, 301-350 - 10 units , 351-400 - 12 units) 1 pen 0  . Insulin Glargine (LANTUS SOLOSTAR) 100 UNIT/ML Solostar Pen Inject 10 Units into the skin daily. (Patient taking differently: Inject 10 Units into the skin at bedtime. ) 1 pen PRN  . lisinopril (ZESTRIL) 20 MG tablet Take 1 tablet by mouth once daily for blood pressure 30 tablet 0  . metoprolol tartrate (LOPRESSOR) 50 MG tablet Take 1 tablet by mouth twice daily 60 tablet 0  . sitaGLIPtin-metformin (JANUMET) 50-500 MG tablet Take 1 tablet by mouth 2 (two) times daily with a meal.    . oxyCODONE (OXY IR/ROXICODONE) 5 MG immediate release tablet Take 1 tablet (5 mg total) by mouth every 6 (six) hours as needed for moderate pain. (Patient not taking: Reported on 07/17/2019) 12 tablet 0   No current facility-administered medications for this visit.      PHYSICAL EXAM: Vitals:   07/17/19 0958  BP: 119/80  Pulse: 90  Resp: 20  Temp: 97.8 F (36.6 C)  SpO2: 97%   Weight: (!) 172.4 kg  Height: 5\' 10"  (1.778 m)    GENERAL: The patient is a well-nourished male, in no acute distress. The vital signs are documented above. The fifth toe amputation was closed primarily the fourth toe was partially closed with dressing changes.  He has minimal surrounding erythema and appears to be healing without difficulty  MEDICAL ISSUES: Stable Daniell Paradise postop.  We will leave his sutures in place.  We will see him back again in 2 weeks for suture removal   Rosetta Posner, MD [facility] Tel [phone] Pager [phone]

## 2019-07-27 ENCOUNTER — Ambulatory Visit: Payer: Self-pay | Admitting: "Endocrinology

## 2019-07-30 ENCOUNTER — Encounter: Payer: Self-pay | Admitting: Physician Assistant

## 2019-07-30 ENCOUNTER — Ambulatory Visit: Payer: Self-pay | Admitting: Physician Assistant

## 2019-07-30 ENCOUNTER — Other Ambulatory Visit: Payer: Self-pay

## 2019-07-30 VITALS — BP 96/60 | HR 81 | Temp 98.6°F

## 2019-07-30 DIAGNOSIS — IMO0002 Reserved for concepts with insufficient information to code with codable children: Secondary | ICD-10-CM

## 2019-07-30 DIAGNOSIS — E1165 Type 2 diabetes mellitus with hyperglycemia: Secondary | ICD-10-CM

## 2019-07-30 DIAGNOSIS — F419 Anxiety disorder, unspecified: Secondary | ICD-10-CM

## 2019-07-30 DIAGNOSIS — E785 Hyperlipidemia, unspecified: Secondary | ICD-10-CM

## 2019-07-30 DIAGNOSIS — I350 Nonrheumatic aortic (valve) stenosis: Secondary | ICD-10-CM

## 2019-07-30 DIAGNOSIS — I10 Essential (primary) hypertension: Secondary | ICD-10-CM

## 2019-07-30 NOTE — Progress Notes (Signed)
BP 96/60   Pulse 81   Temp 98.6 F (37 C)   SpO2 97%    Subjective:    Patient ID: Adam Long, male    DOB: [birth year], 43 y.o.   MRN: [id number]  HPI: Adam Long is a 43 y.o. male presenting on 07/30/2019 for Diabetes   HPI  Pt had a negative covid 9 screening questionnaire   Patient is a 43 year old male with history of uncontrolled diabetes who has recently returned to our office for treatment after a hiatus of of a year.  Patient was recently in the hospital where he underwent an amputation of portion of his left foot due to gangrene.  More recently patient was seen in the ER for near syncope.   He says his blood pressure was very low that day.  He says he was told to hold his lisinopril.   Patient is currently using Lantus 10u qhs and sliding scale of novolog.  Patient is also on janumet-which he ran out of yesterday.  Patient has appointment Tomorrow with surgeon for follow-up of the fore foot amputation.  Patient has appointment Next week with endocrinology for uncontrolled diabetes with complication.  Patient has blood sugar log with him today. most readings are around the 160s.  Patient has no lows.  Patient says he is feeling quite well today thank you.     Relevant past medical, surgical, family and social history reviewed and updated as indicated. Interim medical history since our last visit reviewed. Allergies and medications reviewed and updated.   Current Outpatient Medications:  .  aspirin EC 81 MG tablet, Take 81 mg by mouth daily., Disp: , Rfl:  .  atorvastatin (LIPITOR) 20 MG tablet, Take 1 tablet by mouth once daily, Disp: 30 tablet, Rfl: 0 .  insulin aspart (NOVOLOG FLEXPEN) 100 UNIT/ML FlexPen, Inject tid ac per ssi.   (SSI:  fbs  111-150 - 2 units, 151-200 - 4 units, 201-250 - 6 units, 251-300 - 8 units, 301-350 - 10 units , 351-400 - 12 units), Disp: 1 pen, Rfl: 0 .  Insulin Glargine (LANTUS SOLOSTAR) 100 UNIT/ML Solostar Pen, Inject 10  Units into the skin daily. (Patient taking differently: Inject 10 Units into the skin at bedtime. ), Disp: 1 pen, Rfl: PRN .  lisinopril (ZESTRIL) 20 MG tablet, Take 1 tablet by mouth once daily for blood pressure, Disp: 30 tablet, Rfl: 0 .  metoprolol tartrate (LOPRESSOR) 50 MG tablet, Take 1 tablet by mouth twice daily, Disp: 60 tablet, Rfl: 0 .  sitaGLIPtin-metformin (JANUMET) 50-500 MG tablet, Take 1 tablet by mouth 2 (two) times daily with a meal., Disp: , Rfl:    Review of Systems  Per HPI unless specifically indicated above     Objective:    BP 96/60   Pulse 81   Temp 98.6 F (37 C)   SpO2 97%   Wt Readings from Last 3 Encounters:  07/17/19 (!) 380 lb (172.4 kg)  07/13/19 (!) 365 lb (165.6 kg)  07/10/19 (!) 360 lb (163.3 kg)    Physical Exam Vitals signs reviewed.  Constitutional:      General: He is not in acute distress.    Appearance: He is well-developed. He is obese. He is not ill-appearing.  HENT:     Head: Normocephalic and atraumatic.  Neck:     Musculoskeletal: Neck supple.  Cardiovascular:     Rate and Rhythm: Normal rate and regular rhythm.  Pulmonary:     Effort:  Pulmonary effort is normal.     Breath sounds: Normal breath sounds. No wheezing.  Abdominal:     General: Bowel sounds are normal.     Palpations: Abdomen is soft.     Tenderness: There is no abdominal tenderness.  Lymphadenopathy:     Cervical: No cervical adenopathy.  Skin:    General: Skin is warm and dry.  Neurological:     Mental Status: He is alert and oriented to person, place, and time.  Psychiatric:        Attention and Perception: Attention normal.        Speech: Speech normal.        Behavior: Behavior is cooperative.     Results for orders placed or performed during the hospital encounter of 07/13/19  Basic metabolic panel  Result Value Ref Range   Sodium 131 (L) 135 - 145 mmol/L   Potassium 4.0 3.5 - 5.1 mmol/L   Chloride 97 (L) 98 - 111 mmol/L   CO2 25 22 - 32  mmol/L   Glucose, Bld 195 (H) 70 - 99 mg/dL   BUN 11 6 - 20 mg/dL   Creatinine, Ser 7.09 0.61 - 1.24 mg/dL   Calcium 9.3 8.9 - 62.8 mg/dL   GFR calc non Af Amer >60 >60 mL/min   GFR calc Af Amer >60 >60 mL/min   Anion gap 9 5 - 15  Urinalysis, Routine w reflex microscopic  Result Value Ref Range   Color, Urine STRAW (A) YELLOW   APPearance CLEAR CLEAR   Specific Gravity, Urine 1.002 (L) 1.005 - 1.030   pH 6.0 5.0 - 8.0   Glucose, UA NEGATIVE NEGATIVE mg/dL   Hgb urine dipstick NEGATIVE NEGATIVE   Bilirubin Urine NEGATIVE NEGATIVE   Ketones, ur NEGATIVE NEGATIVE mg/dL   Protein, ur NEGATIVE NEGATIVE mg/dL   Nitrite NEGATIVE NEGATIVE   Leukocytes,Ua NEGATIVE NEGATIVE  CBC with Differential  Result Value Ref Range   WBC 11.4 (H) 4.0 - 10.5 K/uL   RBC 4.74 4.22 - 5.81 MIL/uL   Hemoglobin 13.7 13.0 - 17.0 g/dL   HCT 36.6 29.4 - 76.5 %   MCV 89.7 80.0 - 100.0 fL   MCH 28.9 26.0 - 34.0 pg   MCHC 32.2 30.0 - 36.0 g/dL   RDW 46.5 03.5 - 46.5 %   Platelets 244 150 - 400 K/uL   nRBC 0.0 0.0 - 0.2 %   Neutrophils Relative % 71 %   Neutro Abs 8.1 (H) 1.7 - 7.7 K/uL   Lymphocytes Relative 19 %   Lymphs Abs 2.2 0.7 - 4.0 K/uL   Monocytes Relative 6 %   Monocytes Absolute 0.7 0.1 - 1.0 K/uL   Eosinophils Relative 2 %   Eosinophils Absolute 0.3 0.0 - 0.5 K/uL   Basophils Relative 1 %   Basophils Absolute 0.1 0.0 - 0.1 K/uL   Immature Granulocytes 1 %   Abs Immature Granulocytes 0.10 (H) 0.00 - 0.07 K/uL  CBG monitoring, ED  Result Value Ref Range   Glucose-Capillary 197 (H) 70 - 99 mg/dL      Assessment & Plan:    Encounter Diagnoses  Name Primary?  Marland Kitchen Uncontrolled type 2 diabetes mellitus with complication (HCC) Yes  . Hyperlipidemia, unspecified hyperlipidemia type   . Essential hypertension, benign   . Morbid obesity, unspecified obesity type (HCC)   . Aortic stenosis, severe   . Anxiety      -Pt encouraged to contact [facility] for anxiety.  He is given  contact  information.   -Pt already got flu shot this season.  -In light of the fact that patient has appointment with endocrinologist next week, will not adjust diabetes medications today.  Will defer adjustment of diabetes medications to the endocrinologist next week.  Patient is encouraged to continue to monitor his blood sugars and to take blood sugar log with him to his appointment next week.  Patient is given more samples of the Janumet at so that he will not be out of those.  I discussed with patient that if endocrinology would like to continue that medication, they can send the prescription of it to [facility].  -Patient to continue follow-up with surgeon for foot care.    -Patient to continue with current medications his blood pressure looks good today.  -Patient to continue with cardiology for treatment of aortic stenosis.  Patient may be needing valve replacement in the near future.  -pt to follow up with telemedicine appointment in 19month.  He is to contact office sooner prn.

## 2019-07-31 ENCOUNTER — Encounter: Payer: Self-pay | Admitting: Vascular Surgery

## 2019-07-31 ENCOUNTER — Ambulatory Visit (INDEPENDENT_AMBULATORY_CARE_PROVIDER_SITE_OTHER): Payer: Self-pay | Admitting: Vascular Surgery

## 2019-07-31 VITALS — BP 127/75 | HR 87 | Temp 97.8°F | Resp 20 | Ht 70.0 in | Wt 370.6 lb

## 2019-07-31 DIAGNOSIS — I96 Gangrene, not elsewhere classified: Secondary | ICD-10-CM

## 2019-07-31 NOTE — Progress Notes (Signed)
   Patient name: Adam Long MRN: [id number] DOB: 08-17-1976 Sex: male  REASON FOR VISIT: Follow-up left fourth and fifth toe amputation and suture removal  HPI: Adam Long is a 43 y.o. male here today for follow-up.  He has had no difficulty with healing.  He has been wearing his Darco shoe.  He did strike his great toe on a solid object and had some bleeding from his great toenail bed.  He does have a very long toenail.  I suggested he consider having podiatry help trim this since it is so thick.  Current Outpatient Medications  Medication Sig Dispense Refill  . aspirin EC 81 MG tablet Take 81 mg by mouth daily.    Marland Kitchen atorvastatin (LIPITOR) 20 MG tablet Take 1 tablet by mouth once daily 30 tablet 0  . insulin aspart (NOVOLOG FLEXPEN) 100 UNIT/ML FlexPen Inject tid ac per ssi.   (SSI:  fbs  111-150 - 2 units, 151-200 - 4 units, 201-250 - 6 units, 251-300 - 8 units, 301-350 - 10 units , 351-400 - 12 units) 1 pen 0  . Insulin Glargine (LANTUS SOLOSTAR) 100 UNIT/ML Solostar Pen Inject 10 Units into the skin daily. (Patient taking differently: Inject 10 Units into the skin at bedtime. ) 1 pen PRN  . lisinopril (ZESTRIL) 20 MG tablet Take 1 tablet by mouth once daily for blood pressure 30 tablet 0  . metoprolol tartrate (LOPRESSOR) 50 MG tablet Take 1 tablet by mouth twice daily 60 tablet 0  . sitaGLIPtin-metformin (JANUMET) 50-500 MG tablet Take 1 tablet by mouth 2 (two) times daily with a meal.     No current facility-administered medications for this visit.      PHYSICAL EXAM: Vitals:   07/31/19 1308  BP: 127/75  Pulse: 87  Resp: 20  Temp: 97.8 F (36.6 C)  SpO2: 98%  Weight: (!) 370 lb 9.6 oz (168.1 kg)  Height: 5\' 10"  (1.778 m)    GENERAL: The patient is a well-nourished male, in no acute distress. The vital signs are documented above. Amputation site is healing.  The fifth toe is completely healed.  There was a open area at the base of the  fourth amputation is does have good granulation tissue.  MEDICAL ISSUES: Sutures removed today.  Of asked him to use his Darco shoe for an additional 2 weeks.  Amputation was on 07/09/2019.  We will see him again in 1 month for continued follow-up   Rosetta Posner, MD [facility] Tel [phone] Pager [phone]

## 2019-08-06 ENCOUNTER — Ambulatory Visit (INDEPENDENT_AMBULATORY_CARE_PROVIDER_SITE_OTHER): Payer: Self-pay | Admitting: "Endocrinology

## 2019-08-06 ENCOUNTER — Encounter: Payer: Self-pay | Admitting: "Endocrinology

## 2019-08-06 ENCOUNTER — Other Ambulatory Visit: Payer: Self-pay

## 2019-08-06 VITALS — BP 122/82 | HR 81 | Ht 70.0 in | Wt 371.0 lb

## 2019-08-06 DIAGNOSIS — IMO0002 Reserved for concepts with insufficient information to code with codable children: Secondary | ICD-10-CM

## 2019-08-06 DIAGNOSIS — E118 Type 2 diabetes mellitus with unspecified complications: Secondary | ICD-10-CM

## 2019-08-06 DIAGNOSIS — E782 Mixed hyperlipidemia: Secondary | ICD-10-CM

## 2019-08-06 DIAGNOSIS — E1165 Type 2 diabetes mellitus with hyperglycemia: Secondary | ICD-10-CM

## 2019-08-06 DIAGNOSIS — I10 Essential (primary) hypertension: Secondary | ICD-10-CM

## 2019-08-06 MED ORDER — LANTUS SOLOSTAR 100 UNIT/ML ~~LOC~~ SOPN: 20.0000 [IU] | PEN_INJECTOR | Freq: Every day | SUBCUTANEOUS | 2 refills | Status: DC

## 2019-08-06 MED ORDER — NOVOLOG FLEXPEN 100 UNIT/ML ~~LOC~~ SOPN: 6.0000 [IU] | PEN_INJECTOR | Freq: Three times a day (TID) | SUBCUTANEOUS | 2 refills | Status: DC

## 2019-08-06 NOTE — Patient Instructions (Signed)

## 2019-08-06 NOTE — Progress Notes (Signed)
08/06/2019  Endocrinology follow-up note   Subjective:    Patient ID: Adam Long, male    DOB: Sep 09, 1976. Patient is being seen in follow-up for management of diabetes requested by  Jacquelin Hawking, PA-C This patient was seen previously, disappeared from care since June 2019. Past Medical History:  Diagnosis Date  . Anxiety   . Cholelithiasis   . Depression   . Diabetes mellitus   . Hypercholesteremia   . Hypertension   . Morbid obesity (HCC)    Past Surgical History:  Procedure Laterality Date  . ABDOMINAL AORTOGRAM W/LOWER EXTREMITY N/A 07/06/2019   Procedure: ABDOMINAL AORTOGRAM W/LOWER EXTREMITY;  Surgeon: Sherren Kerns, MD;  Location: [facility];  Service: Cardiovascular;  Laterality: N/A;  Bilateral  . AMPUTATION Left 07/09/2019   Procedure: LEFT FOURTH and Fifth TOE AMPUTATION.;  Surgeon: Larina Earthly, MD;  Location: [facility] OR;  Service: Vascular;  Laterality: Left;   Social History   Socioeconomic History  . Marital status: Single    Spouse name: Not on file  . Number of children: Not on file  . Years of education: Not on file  . Highest education level: Not on file  Occupational History  . Not on file  Social Needs  . Financial resource strain: Not on file  . Food insecurity    Worry: Not on file    Inability: Not on file  . Transportation needs    Medical: Not on file    Non-medical: Not on file  Tobacco Use  . Smoking status: Never Smoker  . Smokeless tobacco: Former Neurosurgeon    Types: Chew  Substance and Sexual Activity  . Alcohol use: Yes    Comment: occasionally  . Drug use: No  . Sexual activity: Not on file  Lifestyle  . Physical activity    Days per week: Not on file    Minutes per session: Not on file  . Stress: Not on file  Relationships  . Social Musician on phone: Not on file    Gets together: Not on file    Attends religious service: Not on file    Active member of club or organization: Not on file    Attends  meetings of clubs or organizations: Not on file    Relationship status: Not on file  Other Topics Concern  . Not on file  Social History Narrative  . Not on file   Outpatient Encounter Medications as of 08/06/2019  Medication Sig  . aspirin EC 81 MG tablet Take 81 mg by mouth daily.  Marland Kitchen atorvastatin (LIPITOR) 20 MG tablet Take 1 tablet by mouth once daily  . insulin aspart (NOVOLOG FLEXPEN) 100 UNIT/ML FlexPen Inject 6-12 Units into the skin 3 (three) times daily before meals.  . Insulin Glargine (LANTUS SOLOSTAR) 100 UNIT/ML Solostar Pen Inject 20 Units into the skin at bedtime.  Marland Kitchen lisinopril (ZESTRIL) 20 MG tablet Take 1 tablet by mouth once daily for blood pressure  . metoprolol tartrate (LOPRESSOR) 50 MG tablet Take 1 tablet by mouth twice daily  . sitaGLIPtin-metformin (JANUMET) 50-500 MG tablet Take 1 tablet by mouth 2 (two) times daily with a meal.  . [DISCONTINUED] insulin aspart (NOVOLOG FLEXPEN) 100 UNIT/ML FlexPen Inject tid ac per ssi.   (SSI:  fbs  111-150 - 2 units, 151-200 - 4 units, 201-250 - 6 units, 251-300 - 8 units, 301-350 - 10 units , 351-400 - 12 units)  . [DISCONTINUED] Insulin Glargine (LANTUS  SOLOSTAR) 100 UNIT/ML Solostar Pen Inject 10 Units into the skin daily. (Patient taking differently: Inject 10 Units into the skin at bedtime. )   No facility-administered encounter medications on file as of 08/06/2019.    ALLERGIES: Allergies  Allergen Reactions  . Pollen Extract Other (See Comments)    Runny nose, watery eyes, sneezing   VACCINATION STATUS: Immunization History  Administered Date(s) Administered  . Influenza Split 07/01/2015, 06/30/2016  . Influenza,inj,Quad PF,6+ Mos 06/12/2019  . Tdap 06/11/2019    Diabetes He presents for his follow-up diabetic visit. He has type 2 diabetes mellitus. Onset time: He was diagnosed at approximate age of 35 years. His disease course has been worsening (This patient was seen in this clinic previously, last visit in June  2019.  He disappeared from care for unclear reasons, returns with higher A1c of 11.5%). There are no hypoglycemic associated symptoms. Pertinent negatives for hypoglycemia include no confusion, headaches, pallor or seizures. Associated symptoms include blurred vision, polydipsia and polyuria. Pertinent negatives for diabetes include no chest pain, no fatigue, no polyphagia and no weakness. There are no hypoglycemic complications. Symptoms are worsening. Diabetic complications include PVD. (Peripheral artery disease complicated by partial amputation of left foot.) Risk factors for coronary artery disease include diabetes mellitus, dyslipidemia, family history, obesity, male sex, hypertension and sedentary lifestyle. Current diabetic treatment includes intensive insulin program and oral agent (dual therapy). He is compliant with treatment none of the time. His weight is decreasing steadily. He is following a generally unhealthy diet. When asked about meal planning, he reported none. He has not had a previous visit with a dietitian. He never participates in exercise. His breakfast blood glucose range is generally 180-200 mg/dl. His lunch blood glucose range is generally >200 mg/dl. His dinner blood glucose range is generally >200 mg/dl. His bedtime blood glucose range is generally >200 mg/dl. His overall blood glucose range is >200 mg/dl. (After he visited the hospital recently, he was discharged on basal/bolus insulin.  He documents still above target glycemic profile.) An ACE inhibitor/angiotensin II receptor blocker is being taken. Eye exam is current.  Hyperlipidemia This is a chronic problem. The current episode started more than 1 year ago. The problem is uncontrolled. Recent lipid tests were reviewed and are high. Exacerbating diseases include diabetes and obesity. Pertinent negatives include no chest pain, myalgias or shortness of breath. Current antihyperlipidemic treatment includes statins. Risk factors for  coronary artery disease include diabetes mellitus, dyslipidemia, hypertension, male sex, obesity, a sedentary lifestyle and family history.  Hypertension This is a chronic problem. The current episode started more than 1 year ago. The problem is controlled. Associated symptoms include blurred vision. Pertinent negatives include no chest pain, headaches, neck pain, palpitations or shortness of breath. Risk factors for coronary artery disease include diabetes mellitus, dyslipidemia, male gender, obesity and sedentary lifestyle. Past treatments include angiotensin blockers. Hypertensive end-organ damage includes PVD.     Review of Systems  Constitutional: Negative for chills, fatigue, fever and unexpected weight change.  HENT: Negative for dental problem, mouth sores and trouble swallowing.   Eyes: Positive for blurred vision. Negative for visual disturbance.  Respiratory: Negative for cough, choking, chest tightness, shortness of breath and wheezing.   Cardiovascular: Negative for chest pain, palpitations and leg swelling.  Gastrointestinal: Negative for abdominal distention, abdominal pain, constipation, diarrhea, nausea and vomiting.  Endocrine: Positive for polydipsia and polyuria. Negative for polyphagia.  Genitourinary: Negative for dysuria, flank pain, hematuria and urgency.  Musculoskeletal: Negative for back pain, gait  problem, myalgias and neck pain.  Skin: Negative for pallor, rash and wound.  Neurological: Negative for seizures, syncope, weakness, numbness and headaches.  Psychiatric/Behavioral: Negative.  Negative for confusion and dysphoric mood.    Objective:    BP 122/82   Pulse 81   Ht 5\' 10"  (1.778 m)   Wt (!) 371 lb (168.3 kg)   BMI 53.23 kg/m   Wt Readings from Last 3 Encounters:  08/06/19 (!) 371 lb (168.3 kg)  07/31/19 (!) 370 lb 9.6 oz (168.1 kg)  07/17/19 (!) 380 lb (172.4 kg)    Physical Exam  Constitutional: He is oriented to person, place, and time. He is  cooperative. No distress.  Morbidly obese, in no acute distress  HENT:  Head: Normocephalic and atraumatic.  Eyes: EOM are normal.  Neck: Normal range of motion. Neck supple. No tracheal deviation present. No thyromegaly present.  Cardiovascular: Normal rate, S1 normal and S2 normal. Exam reveals no gallop.  No murmur heard. Pulses:      Dorsalis pedis pulses are 1+ on the right side and 1+ on the left side.       Posterior tibial pulses are 1+ on the right side and 1+ on the left side.  Pulmonary/Chest: Effort normal. No respiratory distress. He has no wheezes.  Abdominal: He exhibits no distension. There is no abdominal tenderness. There is no guarding and no CVA tenderness.  Musculoskeletal:        General: No edema.     Right shoulder: He exhibits no swelling and no deformity.     Comments: Left foot in a cast after partial amputation as a complication of peripheral arterial disease.  Neurological: He is alert and oriented to person, place, and time. He has normal strength. No cranial nerve deficit or sensory deficit. Gait normal.  Skin: Skin is warm and dry. No rash noted. No cyanosis. Nails show no clubbing.  Psychiatric: He has a normal mood and affect. His speech is normal. Cognition and memory are normal.   CMP ( most recent) CMP     Component Value Date/Time   NA 131 (L) 07/13/2019 1329   K 4.0 07/13/2019 1329   CL 97 (L) 07/13/2019 1329   CO2 25 07/13/2019 1329   GLUCOSE 195 (H) 07/13/2019 1329   BUN 11 07/13/2019 1329   CREATININE 0.72 07/13/2019 1329   CREATININE 0.77 01/07/2017 1040   CALCIUM 9.3 07/13/2019 1329   PROT 7.8 06/28/2019 0939   ALBUMIN 4.0 06/28/2019 0939   AST 35 06/28/2019 0939   ALT 50 (H) 06/28/2019 0939   ALKPHOS 57 06/28/2019 0939   BILITOT 1.2 06/28/2019 0939   GFRNONAA >60 07/13/2019 1329   GFRNONAA >89 04/08/2016 0903   GFRAA >60 07/13/2019 1329   GFRAA >89 04/08/2016 0903     Diabetic Labs (most recent): Lab Results  Component  Value Date   HGBA1C 11.5 (H) 06/28/2019   HGBA1C 12.0 (H) 06/11/2019   HGBA1C 9.6 (H) 06/01/2018     Lipid Panel ( most recent) Lipid Panel     Component Value Date/Time   CHOL 161 06/28/2019 0939   TRIG 148 06/28/2019 0939   HDL 43 06/28/2019 0939   CHOLHDL 3.7 06/28/2019 0939   VLDL 30 06/28/2019 0939   LDLCALC 88 06/28/2019 0939      Assessment & Plan:   1. Uncontrolled type 2 diabetes mellitus with complication, unspecified whether long term insulin use ([place])  - Patient has currently uncontrolled symptomatic type 2 DM since  43 years of age. -He has a habit of noncompliance/nonadherence.  He no showed since June 2019 and returns with A1c of 11.5%.   His logs show random monitoring of blood glucose still above target averaging 200 to 250 mg per DL.  No documented or reported hypoglycemia.   -He is recent labs are reviewed with him.    His diabetes is complicated by peripheral arterial disease with partial amputation of left foot, obesity/sedentary life, noncompliance/nonadherence, lack of proper insurance for medications and doctors visits and patient remains at a high risk for more acute and chronic complications of diabetes which include CAD, CVA, CKD, retinopathy, and neuropathy. These are all discussed in detail with the patient.  - I have counseled the patient on diet management and weight loss, by adopting a carbohydrate restricted/protein rich diet.  - he  admits there is a room for improvement in his diet and drink choices. -  Suggestion is made for him to avoid simple carbohydrates  from his diet including Cakes, Sweet Desserts / Pastries, Ice Cream, Soda (diet and regular), Sweet Tea, Candies, Chips, Cookies, Sweet Pastries,  Store Bought Juices, Alcohol in Excess of  1-2 drinks a day, Artificial Sweeteners, Coffee Creamer, and "Sugar-free" Products. This will help patient to have stable blood glucose profile and potentially avoid unintended weight gain.  - I  encouraged the patient to switch to  unprocessed or minimally processed complex starch and increased protein intake (animal or plant source), fruits, and vegetables.  - Patient is advised to stick to a routine mealtimes to eat 3 meals  a day and avoid unnecessary snacks ( to snack only to correct hypoglycemia).  - The patient has been  scheduled with Norm Salt, RDN, CDE for individualized DM education.  - I have approached patient with the following individualized plan to manage diabetes and patient agrees:   -Given his current and prevailing glycemic burden, he will continue to need intensive treatment with basal/bolus insulin in order for him to achieve and maintain control of diabetes to target.    -For that to happen, he has to display proper compliance for proper monitoring of glucose for safe use of insulin.    -He is advised to increase his Lantus to 20 units nightly, increase his NovoLog to 6 units 3 times a day before meals for pre-meal BG readings of 90-150mg /dl, plus patient specific correction dose for unexpected hyperglycemia above /dl, associated with strict monitoring of glucose 4 times a day-before meals and at bedtime.  -He is warned not to take insulin without proper monitoring of blood glucose per orders.    -Adjustment parameters are given for hypo and hyperglycemia in writing. -Patient is encouraged to call clinic for blood glucose levels less than 70 or above 300 mg /dl. -He returns on Janumet.  Previously he reported intolerance to metformin.  I advised him to continue Janumet 50/500 mg p.o. 2 times daily after supper and after breakfast and after supper as long as he tolerates.   - Patient specific target  A1c;  LDL, HDL, Triglycerides, and  Waist Circumference were discussed in detail.  2) BP/HTN: His blood pressure is controlled to target.  His blood pressure is controlled to target.  He is advised to continue metoprolol 50 mg p.o. daily, lisinopril 20 mg p.o.  daily.   3) Lipids/HPL:   His recent lipid panel showed controlled LDL at 88 improving from 100.  He is advised to continue atorvastatin 20 mg p.o. nightly.   Side  effects and precautions discussed with him. 4)  Weight/Diet: CDE Consult will be reinitiated;  exercise, and detailed carbohydrates information provided.  5) Chronic Care/Health Maintenance:  -Patient is on ACEI/ARB and Statin medications and encouraged to continue to follow up with Ophthalmology, Podiatrist at least yearly or according to recommendations, and advised to   stay away from smoking. I have recommended yearly flu vaccine and pneumonia vaccination at least every 5 years; moderate intensity exercise for up to 150 minutes weekly; and  sleep for at least 7 hours a day.  - I advised patient to maintain close follow up with Jacquelin Hawking, PA-C for primary care needs.  - Time spent with the patient: 45 min, of which >50% was spent in reviewing his blood glucose logs , discussing his hypoglycemia and hyperglycemia episodes, reviewing his current and  previous labs / studies and medications  doses and developing a plan to avoid hypoglycemia and hyperglycemia. Please refer to Patient Instructions for Blood Glucose Monitoring and Insulin/Medications Dosing Guide"  in media tab for additional information. Please  also refer to " Patient Self Inventory" in the Media  tab for reviewed elements of pertinent patient history.  [name] participated in the discussions, expressed understanding, and voiced agreement with the above plans.  All questions were answered to his satisfaction. he is encouraged to contact clinic should he have any questions or concerns prior to his return visit.   Follow up plan: - Return in about 2 weeks (around 08/20/2019), or office, for Follow up with Meter and Logs Only - no Labs.  Marquis Lunch, MD Phone: [phone]  Fax: [phone]   08/06/2019, 10:40 AM

## 2019-08-16 ENCOUNTER — Other Ambulatory Visit: Payer: Self-pay | Admitting: Physician Assistant

## 2019-08-22 ENCOUNTER — Ambulatory Visit (INDEPENDENT_AMBULATORY_CARE_PROVIDER_SITE_OTHER): Payer: Self-pay | Admitting: "Endocrinology

## 2019-08-22 ENCOUNTER — Encounter: Payer: Self-pay | Admitting: "Endocrinology

## 2019-08-22 ENCOUNTER — Other Ambulatory Visit: Payer: Self-pay

## 2019-08-22 VITALS — BP 109/62 | HR 79 | Ht 70.0 in | Wt 381.0 lb

## 2019-08-22 DIAGNOSIS — I10 Essential (primary) hypertension: Secondary | ICD-10-CM

## 2019-08-22 DIAGNOSIS — IMO0002 Reserved for concepts with insufficient information to code with codable children: Secondary | ICD-10-CM

## 2019-08-22 DIAGNOSIS — E782 Mixed hyperlipidemia: Secondary | ICD-10-CM

## 2019-08-22 DIAGNOSIS — E1165 Type 2 diabetes mellitus with hyperglycemia: Secondary | ICD-10-CM

## 2019-08-22 DIAGNOSIS — E118 Type 2 diabetes mellitus with unspecified complications: Secondary | ICD-10-CM

## 2019-08-22 NOTE — Progress Notes (Signed)
08/22/2019  Endocrinology follow-up note   Subjective:    Patient ID: Adam Long, male    DOB: 12-May-1976. Patient is being seen in follow-up for management of currently uncontrolled, complicated type 2 diabetes . PCP:   Soyla Dryer, PA-C   Past Medical History:  Diagnosis Date  . Anxiety   . Cholelithiasis   . Depression   . Diabetes mellitus   . Hypercholesteremia   . Hypertension   . Morbid obesity ([place])    Past Surgical History:  Procedure Laterality Date  . ABDOMINAL AORTOGRAM W/LOWER EXTREMITY N/A 07/06/2019   Procedure: ABDOMINAL AORTOGRAM W/LOWER EXTREMITY;  Surgeon: Elam Dutch, MD;  Location: [place] [facility];  Service: Cardiovascular;  Laterality: N/A;  Bilateral  . AMPUTATION Left 07/09/2019   Procedure: LEFT FOURTH and Fifth TOE AMPUTATION.;  Surgeon: Rosetta Posner, MD;  Location: [facility];  Service: Vascular;  Laterality: Left;   Social History   Socioeconomic History  . Marital status: Single    Spouse name: Not on file  . Number of children: Not on file  . Years of education: Not on file  . Highest education level: Not on file  Occupational History  . Not on file  Social Needs  . Financial resource strain: Not on file  . Food insecurity    Worry: Not on file    Inability: Not on file  . Transportation needs    Medical: Not on file    Non-medical: Not on file  Tobacco Use  . Smoking status: Never Smoker  . Smokeless tobacco: Former Systems developer    Types: Chew  Substance and Sexual Activity  . Alcohol use: Yes    Comment: occasionally  . Drug use: No  . Sexual activity: Not on file  Lifestyle  . Physical activity    Days per week: Not on file    Minutes per session: Not on file  . Stress: Not on file  Relationships  . Social Herbalist on phone: Not on file    Gets together: Not on file    Attends religious service: Not on file    Active member of club or organization: Not on file    Attends meetings of clubs or  organizations: Not on file    Relationship status: Not on file  Other Topics Concern  . Not on file  Social History Narrative  . Not on file   Outpatient Encounter Medications as of 08/22/2019  Medication Sig  . aspirin EC 81 MG tablet Take 81 mg by mouth daily.  Marland Kitchen atorvastatin (LIPITOR) 20 MG tablet Take 1 tablet by mouth once daily  . insulin aspart (NOVOLOG FLEXPEN) 100 UNIT/ML FlexPen Inject 6-12 Units into the skin 3 (three) times daily before meals.  . Insulin Glargine (LANTUS SOLOSTAR) 100 UNIT/ML Solostar Pen Inject 20 Units into the skin at bedtime.  Marland Kitchen lisinopril (ZESTRIL) 20 MG tablet Take 1 tablet by mouth once daily for blood pressure  . metoprolol tartrate (LOPRESSOR) 50 MG tablet Take 1 tablet by mouth twice daily  . sitaGLIPtin-metformin (JANUMET) 50-500 MG tablet Take 1 tablet by mouth 2 (two) times daily with a meal.   No facility-administered encounter medications on file as of 08/22/2019.    ALLERGIES: Allergies  Allergen Reactions  . Pollen Extract Other (See Comments)    Runny nose, watery eyes, sneezing   VACCINATION STATUS: Immunization History  Administered Date(s) Administered  . Influenza Split 07/01/2015, 06/30/2016  . Influenza,inj,Quad PF,6+ Mos  06/12/2019  . Tdap 06/11/2019    Diabetes He presents for his follow-up diabetic visit. He has type 2 diabetes mellitus. Onset time: He was diagnosed at approximate age of 35 years. His disease course has been improving (After he disappeared from care, he recently returns with A1c of 11.5% .). There are no hypoglycemic associated symptoms. Pertinent negatives for hypoglycemia include no confusion, headaches, pallor or seizures. Associated symptoms include blurred vision. Pertinent negatives for diabetes include no chest pain, no fatigue, no polydipsia, no polyphagia, no polyuria and no weakness. There are no hypoglycemic complications. Symptoms are improving. Diabetic complications include PVD. (Peripheral artery  disease complicated by partial amputation of left foot.) Risk factors for coronary artery disease include diabetes mellitus, dyslipidemia, family history, obesity, male sex, hypertension and sedentary lifestyle. Current diabetic treatment includes intensive insulin program and oral agent (dual therapy). He is compliant with treatment none of the time. His weight is increasing steadily. He is following a generally unhealthy diet. When asked about meal planning, he reported none. He has not had a previous visit with a dietitian. He never participates in exercise. His breakfast blood glucose range is generally 180-200 mg/dl. His lunch blood glucose range is generally 180-200 mg/dl. His dinner blood glucose range is generally 180-200 mg/dl. His bedtime blood glucose range is generally 180-200 mg/dl. His overall blood glucose range is 180-200 mg/dl. (He came with significantly improved glycemic profile was fasting and postprandial.  He is currently on intensive treatment with basal/bolus insulin.  ) An ACE inhibitor/angiotensin II receptor blocker is being taken. Eye exam is current.  Hyperlipidemia This is a chronic problem. The current episode started more than 1 year ago. The problem is uncontrolled. Recent lipid tests were reviewed and are high. Exacerbating diseases include diabetes and obesity. Pertinent negatives include no chest pain, myalgias or shortness of breath. Current antihyperlipidemic treatment includes statins. Risk factors for coronary artery disease include diabetes mellitus, dyslipidemia, hypertension, male sex, obesity, a sedentary lifestyle and family history.  Hypertension This is a chronic problem. The current episode started more than 1 year ago. The problem is controlled. Associated symptoms include blurred vision. Pertinent negatives include no chest pain, headaches, neck pain, palpitations or shortness of breath. Risk factors for coronary artery disease include diabetes mellitus,  dyslipidemia, male gender, obesity and sedentary lifestyle. Past treatments include angiotensin blockers. Hypertensive end-organ damage includes PVD.     Review of Systems  Constitutional: Negative for chills, fatigue, fever and unexpected weight change.  HENT: Negative for dental problem, mouth sores and trouble swallowing.   Eyes: Positive for blurred vision. Negative for visual disturbance.  Respiratory: Negative for cough, choking, chest tightness, shortness of breath and wheezing.   Cardiovascular: Negative for chest pain, palpitations and leg swelling.  Gastrointestinal: Negative for abdominal distention, abdominal pain, constipation, diarrhea, nausea and vomiting.  Endocrine: Negative for polydipsia, polyphagia and polyuria.  Genitourinary: Negative for dysuria, flank pain, hematuria and urgency.  Musculoskeletal: Negative for back pain, gait problem, myalgias and neck pain.  Skin: Negative for pallor, rash and wound.  Neurological: Negative for seizures, syncope, weakness, numbness and headaches.  Psychiatric/Behavioral: Negative.  Negative for confusion and dysphoric mood.    Objective:    BP 109/62   Pulse 79   Ht 5\' 10"  (1.778 m)   Wt (!) 381 lb (172.8 kg)   BMI 54.67 kg/m   Wt Readings from Last 3 Encounters:  08/22/19 (!) 381 lb (172.8 kg)  08/06/19 (!) 371 lb (168.3 kg)  07/31/19 13/03/20)  370 lb 9.6 oz (168.1 kg)    Physical Exam  Constitutional: He is oriented to person, place, and time. He is cooperative. No distress.  Morbidly obese, in no acute distress  HENT:  Head: Normocephalic and atraumatic.  Eyes: EOM are normal.  Neck: Normal range of motion. Neck supple. No tracheal deviation present. No thyromegaly present.  Cardiovascular: Normal rate, S1 normal and S2 normal. Exam reveals no gallop.  No murmur heard. Pulses:      Dorsalis pedis pulses are 1+ on the right side and 1+ on the left side.       Posterior tibial pulses are 1+ on the right side and 1+ on  the left side.  Pulmonary/Chest: Effort normal. No respiratory distress. He has no wheezes.  Abdominal: He exhibits no distension. There is no abdominal tenderness. There is no guarding and no CVA tenderness.  Musculoskeletal:        General: No edema.     Right shoulder: He exhibits no swelling and no deformity.     Comments: Left foot in a cast after partial amputation as a complication of peripheral arterial disease.  Neurological: He is alert and oriented to person, place, and time. He has normal strength. No cranial nerve deficit or sensory deficit. Gait normal.  Skin: Skin is warm and dry. No rash noted. No cyanosis. Nails show no clubbing.  Psychiatric: He has a normal mood and affect. His speech is normal. Cognition and memory are normal.   CMP ( most recent) CMP     Component Value Date/Time   NA 131 (L) 07/13/2019 1329   K 4.0 07/13/2019 1329   CL 97 (L) 07/13/2019 1329   CO2 25 07/13/2019 1329   GLUCOSE 195 (H) 07/13/2019 1329   BUN 11 07/13/2019 1329   CREATININE 0.72 07/13/2019 1329   CREATININE 0.77 01/07/2017 1040   CALCIUM 9.3 07/13/2019 1329   PROT 7.8 06/28/2019 0939   ALBUMIN 4.0 06/28/2019 0939   AST 35 06/28/2019 0939   ALT 50 (H) 06/28/2019 0939   ALKPHOS 57 06/28/2019 0939   BILITOT 1.2 06/28/2019 0939   GFRNONAA >60 07/13/2019 1329   GFRNONAA >89 04/08/2016 0903   GFRAA >60 07/13/2019 1329   GFRAA >89 04/08/2016 0903     Diabetic Labs (most recent): Lab Results  Component Value Date   HGBA1C 11.5 (H) 06/28/2019   HGBA1C 12.0 (H) 06/11/2019   HGBA1C 9.6 (H) 06/01/2018     Lipid Panel ( most recent) Lipid Panel     Component Value Date/Time   CHOL 161 06/28/2019 0939   TRIG 148 06/28/2019 0939   HDL 43 06/28/2019 0939   CHOLHDL 3.7 06/28/2019 0939   VLDL 30 06/28/2019 0939   LDLCALC 88 06/28/2019 0939      Assessment & Plan:   1. Uncontrolled type 2 diabetes mellitus with complication, unspecified whether long term insulin use  (HCC)  - Patient has currently uncontrolled symptomatic type 2 DM since  43 years of age. -He returns with significantly improved glycemic profile both fasting and postprandial.  His most recent A1c was 11.5%.     No documented or reported hypoglycemia.   -He is recent labs are reviewed with him.    His diabetes is recently complicated by peripheral arterial disease with partial amputation of left foot, obesity/sedentary life, noncompliance/nonadherence, lack of proper insurance for medications and doctors visits and patient remains at a high risk for more acute and chronic complications of diabetes which include CAD, CVA,  CKD, retinopathy, and neuropathy. These are all discussed in detail with the patient.  - I have counseled the patient on diet management and weight loss, by adopting a carbohydrate restricted/protein rich diet.  - he  admits there is a room for improvement in his diet and drink choices. -  Suggestion is made for him to avoid simple carbohydrates  from his diet including Cakes, Sweet Desserts / Pastries, Ice Cream, Soda (diet and regular), Sweet Tea, Candies, Chips, Cookies, Sweet Pastries,  Store Bought Juices, Alcohol in Excess of  1-2 drinks a day, Artificial Sweeteners, Coffee Creamer, and "Sugar-free" Products. This will help patient to have stable blood glucose profile and potentially avoid unintended weight gain.  - I encouraged the patient to switch to  unprocessed or minimally processed complex starch and increased protein intake (animal or plant source), fruits, and vegetables.  - Patient is advised to stick to a routine mealtimes to eat 3 meals  a day and avoid unnecessary snacks ( to snack only to correct hypoglycemia).  - The patient has been  scheduled with Norm Salt, RDN, CDE for individualized DM education.  - I have approached patient with the following individualized plan to manage diabetes and patient agrees:   -Given his current and prevailing glycemic  burden, he will continue to need intensive treatment with basal/bolus insulin in order for him to achieve and maintain control of diabetes to target.    -For that to happen, he has to display proper compliance for proper monitoring of glucose for safe use of insulin.    -He is advised to increase his Lantus to 30 units nightly, continue NovoLog  6 units 3 times a day before meals for pre-meal BG readings of 90-150mg /dl, plus patient specific correction dose for unexpected hyperglycemia above /dl, associated with strict monitoring of glucose 4 times a day-before meals and at bedtime.  -He is warned not to take insulin without proper monitoring of blood glucose per orders.    -Adjustment parameters are given for hypo and hyperglycemia in writing. -Patient is encouraged to call clinic for blood glucose levels less than 70 or above 300 mg /dl. -He is benefiting from Janumet.  He is advised to continue Janumet 50/500 mg p.o. 2 times daily after supper and after breakfast and after supper as long as he tolerates.   - Patient specific target  A1c;  LDL, HDL, Triglycerides, and  Waist Circumference were discussed in detail.  2) BP/HTN: His blood pressure is controlled to target.  His blood pressure is controlled to target.  He is advised to continue metoprolol 50 mg p.o. daily, lisinopril 20 mg p.o. daily.   3) Lipids/HPL:   His recent lipid panel showed controlled LDL at 88 improving from 100.  He is advised to continue atorvastatin 20 mg p.o. nightly.    Side effects and precautions discussed with him.   4)  Weight/Diet: CDE Consult will be reinitiated;  exercise, and detailed carbohydrates information provided.  5) Chronic Care/Health Maintenance:  -Patient is on ACEI/ARB and Statin medications and encouraged to continue to follow up with Ophthalmology, Podiatrist at least yearly or according to recommendations, and advised to   stay away from smoking. I have recommended yearly flu vaccine and  pneumonia vaccination at least every 5 years; moderate intensity exercise for up to 150 minutes weekly; and  sleep for at least 7 hours a day.  - I advised patient to maintain close follow up with Jacquelin Hawking, PA-C for primary care needs.  -  Patient Care Time Today:  25 min, of which >50% was spent in  counseling and the rest reviewing his  current and  previous labs/studies, previous treatments, his blood glucose readings, and medications' doses and developing a plan for long-term care based on the latest recommendations for standards of care.   [name] participated in the discussions, expressed understanding, and voiced agreement with the above plans.  All questions were answered to his satisfaction. he is encouraged to contact clinic should he have any questions or concerns prior to his return visit.  Follow up plan: - Return in about 9 weeks (around 10/24/2019) for Bring Meter and Logs- A1c in Office.  Marquis Lunch, MD Phone: [phone]  Fax: [phone]   08/22/2019, 4:02 PM

## 2019-08-22 NOTE — Patient Instructions (Signed)

## 2019-08-29 ENCOUNTER — Encounter: Payer: Self-pay | Admitting: Physician Assistant

## 2019-08-29 ENCOUNTER — Ambulatory Visit: Payer: Self-pay | Admitting: Physician Assistant

## 2019-08-29 DIAGNOSIS — I10 Essential (primary) hypertension: Secondary | ICD-10-CM

## 2019-08-29 DIAGNOSIS — E118 Type 2 diabetes mellitus with unspecified complications: Secondary | ICD-10-CM

## 2019-08-29 DIAGNOSIS — I350 Nonrheumatic aortic (valve) stenosis: Secondary | ICD-10-CM

## 2019-08-29 DIAGNOSIS — E785 Hyperlipidemia, unspecified: Secondary | ICD-10-CM

## 2019-08-29 DIAGNOSIS — IMO0002 Reserved for concepts with insufficient information to code with codable children: Secondary | ICD-10-CM

## 2019-08-29 MED ORDER — ATORVASTATIN CALCIUM 20 MG PO TABS: 20.0000 mg | ORAL_TABLET | Freq: Every day | ORAL | 0 refills | Status: DC

## 2019-08-29 MED ORDER — METOPROLOL TARTRATE 50 MG PO TABS: 50.0000 mg | ORAL_TABLET | Freq: Two times a day (BID) | ORAL | 0 refills | Status: DC

## 2019-08-29 MED ORDER — LISINOPRIL 20 MG PO TABS: 20.0000 mg | ORAL_TABLET | Freq: Every day | ORAL | 0 refills | Status: DC

## 2019-08-29 MED ORDER — SITAGLIPTIN PHOS-METFORMIN HCL 50-500 MG PO TABS: 1.0000 | ORAL_TABLET | Freq: Two times a day (BID) | ORAL | 0 refills | Status: DC

## 2019-08-29 NOTE — Progress Notes (Signed)
There were no vitals taken for this visit.   Subjective:    Patient ID: Adam Long, male    DOB: 1975/11/30, 43 y.o.   MRN: [id number]  HPI: Adam Long is a 43 y.o. male presenting on 08/29/2019 for No chief complaint on file.   HPI   Telemedicine appointment through [employer] due to coronavirus pandemic.  I connected with  Adam Long on 08/29/19 by a video enabled telemedicine application and verified that I am speaking with the correct person using two identifiers.   I discussed the limitations of evaluation and management by telemedicine. The patient expressed understanding and agreed to proceed.  Pt is at home.   Provider is at office   Patient is a 43 year old male who recently returned to office for medical care after hiatus of over a year.  He has history of uncontrolled diabetes, hypertension and hyperlipidemia.  More recently he underwent toe amputation due to gangrene.  Patient says he is waiting on meds from [facility] that he requested 2 wk ago.   He says he is continuing with his surgeon for follow-up after his toe amputation.  He says that is going well and he is having no signs of infection.  Patient's diabetes is being managed by endocrinologist.  Patient is continuing with cardiology for evaluation of his heart valve/aortic stenosis.  Patient says he is doing well today and has no complaints other than being concerned about getting his medication from [facility].    Relevant past medical, surgical, family and social history reviewed and updated as indicated. Interim medical history since our last visit reviewed. Allergies and medications reviewed and updated.   Current Outpatient Medications:  .  aspirin EC 81 MG tablet, Take 81 mg by mouth daily., Disp: , Rfl:  .  atorvastatin (LIPITOR) 20 MG tablet, Take 1 tablet by mouth once daily, Disp: 30 tablet, Rfl: 0 .  insulin aspart (NOVOLOG FLEXPEN) 100 UNIT/ML FlexPen, Inject 6-12 Units into the skin 3  (three) times daily before meals., Disp: 5 pen, Rfl: 2 .  Insulin Glargine (LANTUS SOLOSTAR) 100 UNIT/ML Solostar Pen, Inject 20 Units into the skin at bedtime., Disp: 5 pen, Rfl: 2 .  lisinopril (ZESTRIL) 20 MG tablet, Take 1 tablet by mouth once daily for blood pressure, Disp: 30 tablet, Rfl: 0 .  metoprolol tartrate (LOPRESSOR) 50 MG tablet, Take 1 tablet by mouth twice daily, Disp: 60 tablet, Rfl: 0 .  sitaGLIPtin-metformin (JANUMET) 50-500 MG tablet, Take 1 tablet by mouth 2 (two) times daily with a meal., Disp: , Rfl:     Review of Systems  Per HPI unless specifically indicated above     Objective:    There were no vitals taken for this visit.  Wt Readings from Last 3 Encounters:  08/22/19 (!) 381 lb (172.8 kg)  08/06/19 (!) 371 lb (168.3 kg)  07/31/19 (!) 370 lb 9.6 oz (168.1 kg)     Vitals with BMI 08/22/2019 08/06/2019 07/31/2019  Height 5\' 10"  5\' 10"  5\' 10"   Weight 381 lbs 371 lbs 370 lbs 10 oz  BMI 54.67 53.23 53.18  Systolic 109 122  Diastolic 62 82 75  Pulse 79 81 87     Physical Exam Constitutional:      General: He is not in acute distress.    Appearance: Normal appearance. He is obese. He is not ill-appearing.  HENT:     Head: Normocephalic and atraumatic.  Pulmonary:     Effort: Pulmonary effort  is normal. No respiratory distress.  Neurological:     Mental Status: He is alert and oriented to person, place, and time.  Psychiatric:        Attention and Perception: Attention normal.        Mood and Affect: Mood normal.        Speech: Speech normal.        Behavior: Behavior is cooperative.           Assessment & Plan:     Encounter Diagnoses  Name Primary?  Marland Kitchen Uncontrolled type 2 diabetes mellitus with complication ([place]) Yes  . Hyperlipidemia, unspecified hyperlipidemia type   . Essential hypertension, benign   . Morbid obesity, unspecified obesity type ([place])   . Aortic stenosis, severe      Nurse checked with [facility] on patient's  issues.  New prescriptions will be sent to [facility] and patient should receive those within the next week or so.  Patient is told to contact our office if he does not receive these medications.  He can stop by the office and pick up some samples of Janumet as he says he will run out in a matter of days.   Patient to continue with current specialists per their recommendation.  No medication changes today.  Patient will follow up in 3 months for dyslipidemia.  Patient is encouraged to wear a mask when in public and to maintain social distance to reduce risk of getting Covid 19.  Patient is to contact office prior to his follow-up appointment if needed.

## 2019-09-04 ENCOUNTER — Encounter: Payer: Self-pay | Admitting: Vascular Surgery

## 2019-09-04 ENCOUNTER — Other Ambulatory Visit: Payer: Self-pay

## 2019-09-04 ENCOUNTER — Ambulatory Visit (INDEPENDENT_AMBULATORY_CARE_PROVIDER_SITE_OTHER): Payer: Self-pay | Admitting: Vascular Surgery

## 2019-09-04 VITALS — BP 107/80 | HR 93 | Temp 97.3°F | Resp 20 | Ht 70.0 in | Wt 382.0 lb

## 2019-09-04 DIAGNOSIS — I96 Gangrene, not elsewhere classified: Secondary | ICD-10-CM

## 2019-09-04 NOTE — Progress Notes (Signed)
   Patient name: Adam Long MRN: [id number] DOB: May 26, 1976 Sex: male  REASON FOR VISIT: Follow-up amputation left fourth and fifth toe on 07/09/2019  HPI: Adam Long is a 43 y.o. male here today for follow-up.  He presented with gangrene of his left fourth toe and underwent fourth and fifth toe amputation.  He is here for follow-up.  Sutures removed at his last visit.  He has now had near complete healing.  Current Outpatient Medications  Medication Sig Dispense Refill  . aspirin EC 81 MG tablet Take 81 mg by mouth daily.    Marland Kitchen atorvastatin (LIPITOR) 20 MG tablet Take 1 tablet (20 mg total) by mouth daily. 90 tablet 0  . insulin aspart (NOVOLOG FLEXPEN) 100 UNIT/ML FlexPen Inject 6-12 Units into the skin 3 (three) times daily before meals. 5 pen 2  . Insulin Glargine (LANTUS SOLOSTAR) 100 UNIT/ML Solostar Pen Inject 20 Units into the skin at bedtime. 5 pen 2  . lisinopril (ZESTRIL) 20 MG tablet Take 1 tablet (20 mg total) by mouth daily. 90 tablet 0  . metoprolol tartrate (LOPRESSOR) 50 MG tablet Take 1 tablet (50 mg total) by mouth 2 (two) times daily. 180 tablet 0  . sitaGLIPtin-metformin (JANUMET) 50-500 MG tablet Take 1 tablet by mouth 2 (two) times daily with a meal. 180 tablet 0   No current facility-administered medications for this visit.      PHYSICAL EXAM: Vitals:   09/04/19 1304  BP: 107/80  Pulse: 93  Resp: 20  Temp: (!) 97.3 F (36.3 C)  SpO2: 95%  Weight: (!) 382 lb (173.3 kg)  Height: 5\' 10"  (1.778 m)    GENERAL: The patient is a well-nourished male, in no acute distress. The vital signs are documented above. He actually has a palpable dorsalis pedis pulse today now that the swelling has resolved.  He has been healing of the fifth toe amputation and an area approximately 5 mm with very superficial granulation on the fourth toe.  No surrounding erythema.  MEDICAL ISSUES: Continues to do well now nearly 2 months out from  fourth and fifth toe amputation.  We will continue to keep gauze over the small open area until is completely healed and then as needed.  Walking with a regular shoe.  Will see [place] again on an as-needed basis   Rosetta Posner, MD [facility] Tel [phone] Pager [phone]

## 2019-09-10 ENCOUNTER — Other Ambulatory Visit: Payer: Self-pay

## 2019-09-10 ENCOUNTER — Ambulatory Visit (INDEPENDENT_AMBULATORY_CARE_PROVIDER_SITE_OTHER): Payer: Self-pay | Admitting: Cardiovascular Disease

## 2019-09-10 ENCOUNTER — Encounter: Payer: Self-pay | Admitting: Cardiovascular Disease

## 2019-09-10 VITALS — BP 130/90 | HR 86 | Ht 70.0 in | Wt 382.0 lb

## 2019-09-10 DIAGNOSIS — I350 Nonrheumatic aortic (valve) stenosis: Secondary | ICD-10-CM

## 2019-09-10 NOTE — Progress Notes (Signed)
Cardiology Office Note:    Date:  09/10/2019   ID:  Adam Long, DOB 1976/02/24, MRN [id number]  PCP:  Soyla Dryer, PA-C  Cardiologist:  Kate Sable, MD  Electrophysiologist:  None   Referring MD: Soyla Dryer, PA-C   Chief Complaint  Patient presents with  . Shortness of Breath    History of Present Illness:    Adam Long is a 43 y.o. male presenting for evaluation of severe aortic stenosis, referred by Dr. Bronson Ing and Bernerd Pho, PA.   The patient was hospitalized in September 2020 with cellulitis.  During his hospitalization he was noted to have a heart murmur and an echocardiogram demonstrated normal LV systolic function with calcification of the aortic valve and associated severe aortic stenosis.  He ultimately developed gangrene ultimately requiring left 4th and 5th toe amputation on July 09, 2019.  The patient has been treated for diabetes for approximately 5 years. He has also been treated for hypertension and hyperlipidemia over this same time period. He had not been told of a heart murmur until his hospitalization this year. He has no history of rheumatic fever. There is no history of valvular heart disease in his family, but his sister was 'born with a heart murmur,' never requiring any intervention.   He admits to exertional dyspnea, more noticeable this year compared with last year. His weight hasn't changed much over the past 5 years, at one time he was over 400 pounds.  He had a recent episode of resting heart palpitations, diaphoresis, and near syncope.  He feels like this was secondary to the extreme anxiety he has been dealing with since he found out about aortic stenosis.  He has not had exertional lightheadedness or syncope.  He denies exertional chest pain or pressure.  The patient describes dyspnea with low-level activity.  Past Medical History:  Diagnosis Date  . Anxiety   . Cholelithiasis   . Depression   . Diabetes mellitus    . Hypercholesteremia   . Hypertension   . Morbid obesity ([place])     Past Surgical History:  Procedure Laterality Date  . ABDOMINAL AORTOGRAM W/LOWER EXTREMITY N/A 07/06/2019   Procedure: ABDOMINAL AORTOGRAM W/LOWER EXTREMITY;  Surgeon: Elam Dutch, MD;  Location: [facility];  Service: Cardiovascular;  Laterality: N/A;  Bilateral  . AMPUTATION Left 07/09/2019   Procedure: LEFT FOURTH and Fifth TOE AMPUTATION.;  Surgeon: Rosetta Posner, MD;  Location: [facility];  Service: Vascular;  Laterality: Left;    Current Medications: Current Meds  Medication Sig  . aspirin EC 81 MG tablet Take 81 mg by mouth daily.  Marland Kitchen atorvastatin (LIPITOR) 20 MG tablet Take 1 tablet (20 mg total) by mouth daily.  . insulin aspart (NOVOLOG FLEXPEN) 100 UNIT/ML FlexPen Inject 6-12 Units into the skin 3 (three) times daily before meals.  . Insulin Glargine (LANTUS SOLOSTAR) 100 UNIT/ML Solostar Pen Inject 20 Units into the skin at bedtime.  Marland Kitchen lisinopril (ZESTRIL) 20 MG tablet Take 1 tablet (20 mg total) by mouth daily.  . metoprolol tartrate (LOPRESSOR) 50 MG tablet Take 1 tablet (50 mg total) by mouth 2 (two) times daily.  . sitaGLIPtin-metformin (JANUMET) 50-500 MG tablet Take 1 tablet by mouth 2 (two) times daily with a meal.     Allergies:   Pollen extract   Social History   Socioeconomic History  . Marital status: Single    Spouse name: Not on file  . Number of children: Not on file  .  Years of education: Not on file  . Highest education level: Not on file  Occupational History  . Not on file  Tobacco Use  . Smoking status: Never Smoker  . Smokeless tobacco: Former Neurosurgeon    Types: Chew  Substance and Sexual Activity  . Alcohol use: Yes    Comment: occasionally  . Drug use: No  . Sexual activity: Not on file  Other Topics Concern  . Not on file  Social History Narrative  . Not on file   Social Determinants of Health   Financial Resource Strain:   . Difficulty of Paying Living  Expenses: Not on file  Food Insecurity:   . Worried About Programme researcher, broadcasting/film/video in the Last Year: Not on file  . Ran Out of Food in the Last Year: Not on file  Transportation Needs:   . Lack of Transportation (Medical): Not on file  . Lack of Transportation (Non-Medical): Not on file  Physical Activity:   . Days of Exercise per Week: Not on file  . Minutes of Exercise per Session: Not on file  Stress:   . Feeling of Stress : Not on file  Social Connections:   . Frequency of Communication with Friends and Family: Not on file  . Frequency of Social Gatherings with Friends and Family: Not on file  . Attends Religious Services: Not on file  . Active Member of Clubs or Organizations: Not on file  . Attends Banker Meetings: Not on file  . Marital Status: Not on file     Family History: The patient's family history includes Cancer in his mother; Heart disease in his father; Heart murmur in his sister; Hyperlipidemia in his father; Hypertension in his father; Stroke in his father.  ROS:   Please see the history of present illness.    All other systems reviewed and are negative.  EKGs/Labs/Other Studies Reviewed:    The following studies were reviewed today: Echo 06-12-2019: IMPRESSIONS    1. The left ventricle has normal systolic function, with an ejection fraction of 55-60%. The cavity size was normal. There is mildly increased left ventricular wall thickness. Left ventricular diastolic Doppler parameters are consistent with  pseudonormalization. Elevated mean left atrial pressure.  2. The right ventricle has normal systolic function. The cavity was normal. There is no increase in right ventricular wall thickness.  3. The aortic valve has an indeterminate number of cusps. Severely thickening of the aortic valve. Severe calcifcation of the aortic valve. Severe stenosis of the aortic valve. Severe aortic annular calcification noted.  4. The mitral valve is abnormal.  Moderate thickening of the mitral valve leaflet. Moderate calcification of the mitral valve leaflet. There is moderate mitral annular calcification present. No evidence of mitral valve stenosis.  5. The aorta is normal unless otherwise noted.  6. The aortic root is normal in size and structure.  7. Pulmonary hypertension is indeterminant, inadequate TR jet.  8. The interatrial septum was not well visualized.  FINDINGS  Left Ventricle: The left ventricle has normal systolic function, with an ejection fraction of 55-60%. The cavity size was normal. There is mildly increased left ventricular wall thickness. Left ventricular diastolic Doppler parameters are consistent  with pseudonormalization. Elevated mean left atrial pressure Definity contrast agent was given IV to delineate the left ventricular endocardial borders.  Right Ventricle: The right ventricle has normal systolic function. The cavity was normal. There is no increase in right ventricular wall thickness.  Left Atrium: Left atrial  size was normal in size.  Right Atrium: Right atrial size was normal in size. Right atrial pressure is estimated at 10 mmHg.  Interatrial Septum: The interatrial septum was not well visualized.  Pericardium: There is no evidence of pericardial effusion.  Mitral Valve: The mitral valve is abnormal. Moderate thickening of the mitral valve leaflet. Moderate calcification of the mitral valve leaflet. There is moderate mitral annular calcification present. Mitral valve regurgitation is not visualized by color  flow Doppler. No evidence of mitral valve stenosis.  Tricuspid Valve: The tricuspid valve is normal in structure. Tricuspid valve regurgitation was not visualized by color flow Doppler.  Aortic Valve: The aortic valve has an indeterminate number of cusps Severely thickening of the aortic valve. Severe calcifcation of the aortic valve. Aortic valve regurgitation was not visualized by color flow  Doppler. There is Severe stenosis of the  aortic valve, with a calculated valve area of 1.01 cm. Severe aortic annular calcification noted.  Pulmonic Valve: The pulmonic valve was not well visualized. Pulmonic valve regurgitation is not visualized by color flow Doppler. No evidence of pulmonic stenosis.  Aorta: The aortic root is normal in size and structure. The aorta is normal unless otherwise noted.  Pulmonary Artery: Pulmonary hypertension is indeterminant, inadequate TR jet.  Venous: The inferior vena cava was not well visualized.  EKG:  EKG is ordered today.  The ekg ordered today demonstrates normal sinus rhythm 86 bpm, nonspecific T wave abnormality.  Recent Labs: 06/28/2019: ALT 50 07/13/2019: BUN 11; Creatinine, Ser 0.72; Hemoglobin 13.7; Platelets 244; Potassium 4.0; Sodium 131  Recent Lipid Panel    Component Value Date/Time   CHOL 161 06/28/2019 0939   TRIG 148 06/28/2019 0939   HDL 43 06/28/2019 0939   CHOLHDL 3.7 06/28/2019 0939   VLDL 30 06/28/2019 0939   LDLCALC 88 06/28/2019 0939    Physical Exam:    VS:  BP 130/90   Pulse 86   Ht  (1.778 m)   Wt (!) 382 lb (173.3 kg)   SpO2 99%   BMI 54.81 kg/m     Wt Readings from Last 3 Encounters:  09/10/19 (!) 382 lb (173.3 kg)  09/04/19 (!) 382 lb (173.3 kg)  08/22/19 (!) 381 lb (172.8 kg)     GEN:  Morbidly obese male in no acute distress HEENT: Normal NECK: No JVD; bilateral carotid bruits LYMPHATICS: No lymphadenopathy CARDIAC: RRR, 3/6 late peaking harsh crescendo decrescendo murmur at the right upper sternal border, no diastolic murmur RESPIRATORY:  Clear to auscultation without rales, wheezing or rhonchi  ABDOMEN: Soft, non-tender, non-distended MUSCULOSKELETAL: 1+ bilateral pretibial edema; No deformity  SKIN: Warm and dry NEUROLOGIC:  Alert and oriented x 3 PSYCHIATRIC:  Normal affect   STS Risk Calculator: Isolated AVR Risk of Mortality: 2.407% Renal Failure: 3.779% Permanent  Stroke: 0.342% Prolonged Ventilation: 8.200% DSW Infection: 0.442% Reoperation: 2.474% Morbidity or Mortality: 13.379% Short Length of Stay: 38.365% Long Length of Stay: 6.373%  ASSESSMENT:    1. Nonrheumatic aortic valve stenosis    PLAN:    In order of problems listed above:  The patient has severe, stage D1 aortic stenosis.  This is associated with New York Heart Association functional class III symptoms of exertional dyspnea.  His echo images are reviewed and demonstrate well-preserved LV systolic function.  The aortic valve is severely calcified and restricted.  I am unable to determine whether the valve is bicuspid, but I suspect it is either bicuspid or rheumatic considering this patient's young  age.  Transaortic gradients are measured as high as a mean of 51 mmHg and peak of 82 mmHg. The AV dimensionless index is 0.22.   I have reviewed the natural history of aortic stenosis with the patient today. We have discussed the limitations of medical therapy and the poor prognosis associated with symptomatic aortic stenosis. We have reviewed potential treatment options, including palliative medical therapy, conventional surgical aortic valve replacement, and transcatheter aortic valve replacement. We discussed treatment options in the context of the patient's specific comorbid medical conditions.   Treatment is clearly warranted.  The patient's surgical risk is increased because of morbid obesity, insulin requiring diabetes.  His young age is obviously a factor in considering TAVR versus conventional surgical aortic valve replacement.  It will be important to evaluate him further with a right and left heart catheterization as well as CTA studies of the heart as well as the chest abdomen and pelvis. I have reviewed the risks, indications, and alternatives to cardiac catheterization, possible angioplasty, and stenting with the patient. Risks include but are not limited to bleeding,  infection, vascular injury, stroke, myocardial infection, arrhythmia, kidney injury, radiation-related injury in the case of prolonged fluoroscopy use, emergency cardiac surgery, and death. The patient understands the risks of serious complication is 1-2 in 1000 with diagnostic cardiac cath and 1-2% or less with angioplasty/stenting.   I reviewed the TAVR procedural animation with him today and explained how the procedure works.  We went over some of the potential risks and benefits.  He understands that a multidisciplinary team will review his case and he will undergo formal cardiac surgical evaluation after his cardiac catheterization and CT studies are completed.  Medication Adjustments/Labs and Tests Ordered: Current medicines are reviewed at length with the patient today.  Concerns regarding medicines are outlined above.  Orders Placed This Encounter  Procedures  . CT CORONARY MORPH W/CTA COR W/SCORE W/CA W/CM &/OR WO/CM  . CT ANGIO CHEST AORTA W &/OR WO CONTRAST  . CTA abd/pelvis (TAVR)  . Basic Metabolic Panel (BMET)  . CBC w/Diff  . EKG 12-Lead cs   No orders of the defined types were placed in this encounter.   Patient Instructions  COVID SCREENING INFORMATION: You are scheduled for your COVID screening on 08/23/2019 at 11:20AM. [facility] [address] Stay in the RIGHT lane and proceed under the brick awning (NOT the tent) and tell them you are there for pre-procedure testing Do NOT bring any pets with you to the testing site   CATHETERIZATION INSTRUCTIONS: You are scheduled for a Cardiac Catheterization on: Monday, September 24, 2019 with Dr. Excell Seltzerooper.  1. [facility]: [address] [place], [place] [address] at: 5:30AM. You are allowed one visitor in the waiting room during your procedure. Both you are your visitor must wear masks. Special note: Every effort is made  to have your procedure done on time. Please understand that emergencies sometimes delay scheduled procedures.  2. Diet: Do not eat solid foods after midnight.  You may have clear liquids until 5am upon the day of the procedure.  3. Labs: TODAY!  4. Medication instructions in preparation for your procedure:  1) Take 1/2 dose of Lantus the night before your procedure  2) Do not take any insulin the day of your cath  3) Do not take Janumet the morning of your procedure and 48 hours after  4)  Make sure to take your Aspirin 81 mg the morning of your cath  5) You may take your other medications as directed  5. Plan for one night stay--bring personal belongings. 6. Bring a current list of your medications and current insurance cards. 7. You MUST have a responsible person to drive you home. 8. Someone MUST be with you the first 24 hours after you arrive home or your discharge will be delayed. 9. Please wear clothes that are easy to get on and off and wear slip-on shoes.  Thank you for allowing us to care for you!   -- [facility]     Signed, Keithon Mccoin, MD  09/10/2019 1:46 PM    [facility] 

## 2019-09-10 NOTE — H&P (View-Only) (Signed)
Cardiology Office Note:    Date:  09/10/2019   ID:  [name], DOB 1976/02/24, MRN [id number]  PCP:  Soyla Dryer, PA-C  Cardiologist:  Kate Sable, MD  Electrophysiologist:  None   Referring MD: Soyla Dryer, PA-C   Chief Complaint  Patient presents with  . Shortness of Breath    History of Present Illness:    Adam Long is a 43 y.o. male presenting for evaluation of severe aortic stenosis, referred by Dr. Bronson Ing and Bernerd Pho, PA.   The patient was hospitalized in September 2020 with cellulitis.  During his hospitalization he was noted to have a heart murmur and an echocardiogram demonstrated normal LV systolic function with calcification of the aortic valve and associated severe aortic stenosis.  He ultimately developed gangrene ultimately requiring left 4th and 5th toe amputation on July 09, 2019.  The patient has been treated for diabetes for approximately 5 years. He has also been treated for hypertension and hyperlipidemia over this same time period. He had not been told of a heart murmur until his hospitalization this year. He has no history of rheumatic fever. There is no history of valvular heart disease in his family, but his sister was 'born with a heart murmur,' never requiring any intervention.   He admits to exertional dyspnea, more noticeable this year compared with last year. His weight hasn't changed much over the past 5 years, at one time he was over 400 pounds.  He had a recent episode of resting heart palpitations, diaphoresis, and near syncope.  He feels like this was secondary to the extreme anxiety he has been dealing with since he found out about aortic stenosis.  He has not had exertional lightheadedness or syncope.  He denies exertional chest pain or pressure.  The patient describes dyspnea with low-level activity.  Past Medical History:  Diagnosis Date  . Anxiety   . Cholelithiasis   . Depression   . Diabetes mellitus    . Hypercholesteremia   . Hypertension   . Morbid obesity ([place])     Past Surgical History:  Procedure Laterality Date  . ABDOMINAL AORTOGRAM W/LOWER EXTREMITY N/A 07/06/2019   Procedure: ABDOMINAL AORTOGRAM W/LOWER EXTREMITY;  Surgeon: Elam Dutch, MD;  Location: [facility];  Service: Cardiovascular;  Laterality: N/A;  Bilateral  . AMPUTATION Left 07/09/2019   Procedure: LEFT FOURTH and Fifth TOE AMPUTATION.;  Surgeon: Rosetta Posner, MD;  Location: [facility];  Service: Vascular;  Laterality: Left;    Current Medications: Current Meds  Medication Sig  . aspirin EC 81 MG tablet Take 81 mg by mouth daily.  Marland Kitchen atorvastatin (LIPITOR) 20 MG tablet Take 1 tablet (20 mg total) by mouth daily.  . insulin aspart (NOVOLOG FLEXPEN) 100 UNIT/ML FlexPen Inject 6-12 Units into the skin 3 (three) times daily before meals.  . Insulin Glargine (LANTUS SOLOSTAR) 100 UNIT/ML Solostar Pen Inject 20 Units into the skin at bedtime.  Marland Kitchen lisinopril (ZESTRIL) 20 MG tablet Take 1 tablet (20 mg total) by mouth daily.  . metoprolol tartrate (LOPRESSOR) 50 MG tablet Take 1 tablet (50 mg total) by mouth 2 (two) times daily.  . sitaGLIPtin-metformin (JANUMET) 50-500 MG tablet Take 1 tablet by mouth 2 (two) times daily with a meal.     Allergies:   Pollen extract   Social History   Socioeconomic History  . Marital status: Single    Spouse name: Not on file  . Number of children: Not on file  .  Years of education: Not on file  . Highest education level: Not on file  Occupational History  . Not on file  Tobacco Use  . Smoking status: Never Smoker  . Smokeless tobacco: Former Neurosurgeon    Types: Chew  Substance and Sexual Activity  . Alcohol use: Yes    Comment: occasionally  . Drug use: No  . Sexual activity: Not on file  Other Topics Concern  . Not on file  Social History Narrative  . Not on file   Social Determinants of Health   Financial Resource Strain:   . Difficulty of Paying Living  Expenses: Not on file  Food Insecurity:   . Worried About Programme researcher, broadcasting/film/video in the Last Year: Not on file  . Ran Out of Food in the Last Year: Not on file  Transportation Needs:   . Lack of Transportation (Medical): Not on file  . Lack of Transportation (Non-Medical): Not on file  Physical Activity:   . Days of Exercise per Week: Not on file  . Minutes of Exercise per Session: Not on file  Stress:   . Feeling of Stress : Not on file  Social Connections:   . Frequency of Communication with Friends and Family: Not on file  . Frequency of Social Gatherings with Friends and Family: Not on file  . Attends Religious Services: Not on file  . Active Member of Clubs or Organizations: Not on file  . Attends Banker Meetings: Not on file  . Marital Status: Not on file     Family History: The patient's family history includes Cancer in his mother; Heart disease in his father; Heart murmur in his sister; Hyperlipidemia in his father; Hypertension in his father; Stroke in his father.  ROS:   Please see the history of present illness.    All other systems reviewed and are negative.  EKGs/Labs/Other Studies Reviewed:    The following studies were reviewed today: Echo 06-12-2019: IMPRESSIONS    1. The left ventricle has normal systolic function, with an ejection fraction of 55-60%. The cavity size was normal. There is mildly increased left ventricular wall thickness. Left ventricular diastolic Doppler parameters are consistent with  pseudonormalization. Elevated mean left atrial pressure.  2. The right ventricle has normal systolic function. The cavity was normal. There is no increase in right ventricular wall thickness.  3. The aortic valve has an indeterminate number of cusps. Severely thickening of the aortic valve. Severe calcifcation of the aortic valve. Severe stenosis of the aortic valve. Severe aortic annular calcification noted.  4. The mitral valve is abnormal.  Moderate thickening of the mitral valve leaflet. Moderate calcification of the mitral valve leaflet. There is moderate mitral annular calcification present. No evidence of mitral valve stenosis.  5. The aorta is normal unless otherwise noted.  6. The aortic root is normal in size and structure.  7. Pulmonary hypertension is indeterminant, inadequate TR jet.  8. The interatrial septum was not well visualized.  FINDINGS  Left Ventricle: The left ventricle has normal systolic function, with an ejection fraction of 55-60%. The cavity size was normal. There is mildly increased left ventricular wall thickness. Left ventricular diastolic Doppler parameters are consistent  with pseudonormalization. Elevated mean left atrial pressure Definity contrast agent was given IV to delineate the left ventricular endocardial borders.  Right Ventricle: The right ventricle has normal systolic function. The cavity was normal. There is no increase in right ventricular wall thickness.  Left Atrium: Left atrial  size was normal in size.  Right Atrium: Right atrial size was normal in size. Right atrial pressure is estimated at 10 mmHg.  Interatrial Septum: The interatrial septum was not well visualized.  Pericardium: There is no evidence of pericardial effusion.  Mitral Valve: The mitral valve is abnormal. Moderate thickening of the mitral valve leaflet. Moderate calcification of the mitral valve leaflet. There is moderate mitral annular calcification present. Mitral valve regurgitation is not visualized by color  flow Doppler. No evidence of mitral valve stenosis.  Tricuspid Valve: The tricuspid valve is normal in structure. Tricuspid valve regurgitation was not visualized by color flow Doppler.  Aortic Valve: The aortic valve has an indeterminate number of cusps Severely thickening of the aortic valve. Severe calcifcation of the aortic valve. Aortic valve regurgitation was not visualized by color flow  Doppler. There is Severe stenosis of the  aortic valve, with a calculated valve area of 1.01 cm. Severe aortic annular calcification noted.  Pulmonic Valve: The pulmonic valve was not well visualized. Pulmonic valve regurgitation is not visualized by color flow Doppler. No evidence of pulmonic stenosis.  Aorta: The aortic root is normal in size and structure. The aorta is normal unless otherwise noted.  Pulmonary Artery: Pulmonary hypertension is indeterminant, inadequate TR jet.  Venous: The inferior vena cava was not well visualized.  EKG:  EKG is ordered today.  The ekg ordered today demonstrates normal sinus rhythm 86 bpm, nonspecific T wave abnormality.  Recent Labs: 06/28/2019: ALT 50 07/13/2019: BUN 11; Creatinine, Ser 0.72; Hemoglobin 13.7; Platelets 244; Potassium 4.0; Sodium 131  Recent Lipid Panel    Component Value Date/Time   CHOL 161 06/28/2019 0939   TRIG 148 06/28/2019 0939   HDL 43 06/28/2019 0939   CHOLHDL 3.7 06/28/2019 0939   VLDL 30 06/28/2019 0939   LDLCALC 88 06/28/2019 0939    Physical Exam:    VS:  BP 130/90   Pulse 86   Ht  (1.778 m)   Wt (!) 382 lb (173.3 kg)   SpO2 99%   BMI 54.81 kg/m     Wt Readings from Last 3 Encounters:  09/10/19 (!) 382 lb (173.3 kg)  09/04/19 (!) 382 lb (173.3 kg)  08/22/19 (!) 381 lb (172.8 kg)     GEN:  Morbidly obese male in no acute distress HEENT: Normal NECK: No JVD; bilateral carotid bruits LYMPHATICS: No lymphadenopathy CARDIAC: RRR, 3/6 late peaking harsh crescendo decrescendo murmur at the right upper sternal border, no diastolic murmur RESPIRATORY:  Clear to auscultation without rales, wheezing or rhonchi  ABDOMEN: Soft, non-tender, non-distended MUSCULOSKELETAL: 1+ bilateral pretibial edema; No deformity  SKIN: Warm and dry NEUROLOGIC:  Alert and oriented x 3 PSYCHIATRIC:  Normal affect   STS Risk Calculator: Isolated AVR Risk of Mortality: 2.407% Renal Failure: 3.779% Permanent  Stroke: 0.342% Prolonged Ventilation: 8.200% DSW Infection: 0.442% Reoperation: 2.474% Morbidity or Mortality: 13.379% Short Length of Stay: 38.365% Long Length of Stay: 6.373%  ASSESSMENT:    1. Nonrheumatic aortic valve stenosis    PLAN:    In order of problems listed above:  The patient has severe, stage D1 aortic stenosis.  This is associated with New York Heart Association functional class III symptoms of exertional dyspnea.  His echo images are reviewed and demonstrate well-preserved LV systolic function.  The aortic valve is severely calcified and restricted.  I am unable to determine whether the valve is bicuspid, but I suspect it is either bicuspid or rheumatic considering this patient's young  age.  Transaortic gradients are measured as high as a mean of 51 mmHg and peak of 82 mmHg. The AV dimensionless index is 0.22.   I have reviewed the natural history of aortic stenosis with the patient today. We have discussed the limitations of medical therapy and the poor prognosis associated with symptomatic aortic stenosis. We have reviewed potential treatment options, including palliative medical therapy, conventional surgical aortic valve replacement, and transcatheter aortic valve replacement. We discussed treatment options in the context of the patient's specific comorbid medical conditions.   Treatment is clearly warranted.  The patient's surgical risk is increased because of morbid obesity, insulin requiring diabetes.  His young age is obviously a factor in considering TAVR versus conventional surgical aortic valve replacement.  It will be important to evaluate him further with a right and left heart catheterization as well as CTA studies of the heart as well as the chest abdomen and pelvis. I have reviewed the risks, indications, and alternatives to cardiac catheterization, possible angioplasty, and stenting with the patient. Risks include but are not limited to bleeding,  infection, vascular injury, stroke, myocardial infection, arrhythmia, kidney injury, radiation-related injury in the case of prolonged fluoroscopy use, emergency cardiac surgery, and death. The patient understands the risks of serious complication is 1-2 in 1000 with diagnostic cardiac cath and 1-2% or less with angioplasty/stenting.   I reviewed the TAVR procedural animation with him today and explained how the procedure works.  We went over some of the potential risks and benefits.  He understands that a multidisciplinary team will review his case and he will undergo formal cardiac surgical evaluation after his cardiac catheterization and CT studies are completed.  Medication Adjustments/Labs and Tests Ordered: Current medicines are reviewed at length with the patient today.  Concerns regarding medicines are outlined above.  Orders Placed This Encounter  Procedures  . CT CORONARY MORPH W/CTA COR W/SCORE W/CA W/CM &/OR WO/CM  . CT ANGIO CHEST AORTA W &/OR WO CONTRAST  . CTA abd/pelvis (TAVR)  . Basic Metabolic Panel (BMET)  . CBC w/Diff  . EKG 12-Lead cs   No orders of the defined types were placed in this encounter.   Patient Instructions  COVID SCREENING INFORMATION: You are scheduled for your COVID screening on 08/23/2019 at 11:20AM. [facility] ([facility]) [address] Stay in the RIGHT lane and proceed under the brick awning (NOT the tent) and tell them you are there for pre-procedure testing Do NOT bring any pets with you to the testing site   CATHETERIZATION INSTRUCTIONS: You are scheduled for a Cardiac Catheterization on: Monday, September 24, 2019 with Dr. Excell Seltzerooper.  1. [facility] at [facility]: [address] [place], [place] [address] at: 5:30AM. You are allowed one visitor in the waiting room during your procedure. Both you are your visitor must wear masks. Special note: Every effort is made  to have your procedure done on time. Please understand that emergencies sometimes delay scheduled procedures.  2. Diet: Do not eat solid foods after midnight.  You may have clear liquids until 5am upon the day of the procedure.  3. Labs: TODAY!  4. Medication instructions in preparation for your procedure:  1) Take 1/2 dose of Lantus the night before your procedure  2) Do not take any insulin the day of your cath  3) Do not take Janumet the morning of your procedure and 48 hours after  4)  Make sure to take your Aspirin 81 mg the morning of your cath  5) You may take your other medications as directed  5. Plan for one night stay--bring personal belongings. 6. Bring a current list of your medications and current insurance cards. 7. You MUST have a responsible person to drive you home. 8. Someone MUST be with you the first 24 hours after you arrive home or your discharge will be delayed. 9. Please wear clothes that are easy to get on and off and wear slip-on shoes.  Thank you for allowing [place] to care for you!   -- [facility]     Signed, Tonny Bollman, MD  09/10/2019 1:46 PM    [facility]

## 2019-09-10 NOTE — Patient Instructions (Addendum)
COVID SCREENING INFORMATION: You are scheduled for your COVID screening on 08/23/2019 at 11:20AM. [facility] ([facility]) [address] Stay in the RIGHT lane and proceed under the brick awning (NOT the tent) and tell them you are there for pre-procedure testing Do NOT bring any pets with you to the testing site   CATHETERIZATION INSTRUCTIONS: You are scheduled for a Cardiac Catheterization on: Monday, September 24, 2019 with Dr. Burt Knack.  1. Please arrive at the [facility] (Main Entrance A) at [facility]: [address] [place], [place] [address] at: 5:30AM. You are allowed one visitor in the waiting room during your procedure. Both you are your visitor must wear masks. Special note: Every effort is made to have your procedure done on time. Please understand that emergencies sometimes delay scheduled procedures.  2. Diet: Do not eat solid foods after midnight.  You may have clear liquids until 5am upon the day of the procedure.  3. Labs: TODAY!  4. Medication instructions in preparation for your procedure:  1) Take 1/2 dose of Lantus the night before your procedure  2) Do not take any insulin the day of your cath  3) Do not take Janumet the morning of your procedure and 48 hours after  4) Make sure to take your Aspirin 81 mg the morning of your cath  5) You may take your other medications as directed  5. Plan for one night stay--bring personal belongings. 6. Bring a current list of your medications and current insurance cards. 7. You MUST have a responsible person to drive you home. 8. Someone MUST be with you the first 24 hours after you arrive home or your discharge will be delayed. 9. Please wear clothes that are easy to get on and off and wear slip-on shoes.  Thank you for allowing [place] to care for you!   -- [place] [facility]

## 2019-09-11 LAB — CBC WITH DIFFERENTIAL/PLATELET
Basophils Absolute: 0.1 10*3/uL (ref 0.0–0.2)
Basos: 1 %
EOS (ABSOLUTE): 0.1 10*3/uL (ref 0.0–0.4)
Eos: 1 %
Hematocrit: 40.1 % (ref 37.5–51.0)
Hemoglobin: 14 g/dL (ref 13.0–17.7)
Immature Grans (Abs): 0.1 10*3/uL (ref 0.0–0.1)
Immature Granulocytes: 1 %
Lymphocytes Absolute: 1.8 10*3/uL (ref 0.7–3.1)
Lymphs: 17 %
MCH: 30.6 pg (ref 26.6–33.0)
MCHC: 34.9 g/dL (ref 31.5–35.7)
MCV: 88 fL (ref 79–97)
Monocytes Absolute: 0.6 10*3/uL (ref 0.1–0.9)
Monocytes: 6 %
Neutrophils Absolute: 7.8 10*3/uL — ABNORMAL HIGH (ref 1.4–7.0)
Neutrophils: 74 %
Platelets: 265 10*3/uL (ref 150–450)
RBC: 4.58 x10E6/uL (ref 4.14–5.80)
RDW: 12.7 % (ref 11.6–15.4)
WBC: 10.4 10*3/uL (ref 3.4–10.8)

## 2019-09-11 LAB — BASIC METABOLIC PANEL
BUN/Creatinine Ratio: 18 (ref 9–20)
BUN: 12 mg/dL (ref 6–24)
CO2: 22 mmol/L (ref 20–29)
Calcium: 9.8 mg/dL (ref 8.7–10.2)
Chloride: 97 mmol/L (ref 96–106)
Creatinine, Ser: 0.67 mg/dL — ABNORMAL LOW (ref 0.76–1.27)
GFR calc Af Amer: 136 mL/min/{1.73_m2} (ref >59)
GFR calc non Af Amer: 118 mL/min/{1.73_m2} (ref >59)
Glucose: 194 mg/dL — ABNORMAL HIGH (ref 65–99)
Potassium: 4.1 mmol/L (ref 3.5–5.2)
Sodium: 134 mmol/L (ref 134–144)

## 2019-09-18 ENCOUNTER — Encounter: Payer: Self-pay | Admitting: Nutrition

## 2019-09-18 ENCOUNTER — Encounter: Payer: Self-pay | Attending: "Endocrinology | Admitting: Nutrition

## 2019-09-18 ENCOUNTER — Other Ambulatory Visit: Payer: Self-pay

## 2019-09-18 DIAGNOSIS — I10 Essential (primary) hypertension: Secondary | ICD-10-CM | POA: Insufficient documentation

## 2019-09-18 DIAGNOSIS — IMO0002 Reserved for concepts with insufficient information to code with codable children: Secondary | ICD-10-CM

## 2019-09-18 DIAGNOSIS — E1165 Type 2 diabetes mellitus with hyperglycemia: Secondary | ICD-10-CM | POA: Insufficient documentation

## 2019-09-18 DIAGNOSIS — E118 Type 2 diabetes mellitus with unspecified complications: Secondary | ICD-10-CM | POA: Insufficient documentation

## 2019-09-18 NOTE — Patient Instructions (Addendum)
Goals  Drink a gallon of water per day Cut out diet sodas Cut out snacks between meals Increase lower carb vegetables with lunch and dinner Exercise/walk as you can Lose 1-2 lbs per week Cut out processed foods

## 2019-09-18 NOTE — Progress Notes (Signed)
Medical Nutrition Therapy:  Appt start time: 1300 end time:  1400.   Assessment:  Primary concerns today: Diabetes Type 2 and Obesity. Had DM x 5-6 years. going to get a heart Cath on Monday Dec 28th. Currently taking Janument, Lantus 30 units and Novolog with meals 6 units with meals with sliding scale. FBS:156, 150, 131, 140, 140 BL 200, 211, 170, 163 BD 165, 123, 146, 135 Bedtime  153, 140,  138, 142 mg/dl LIves with his uncle. Cooks at home mostly but eats out fast food some.  Wants to lose weight and improve blood sugars Diet is excessive in calories, fat and sodium and low in fresh fruits, vegetables and whole grains.  Blood sugars are doing much better.  Lab Results  Component Value Date   HGBA1C 11.5 (H) 06/28/2019   CMP Latest Ref Rng & Units 09/10/2019 07/13/2019 07/09/2019  Glucose 65 - 99 mg/dL 194(H) 195(H) 171(H)  BUN 6 - 24 mg/dL 12 11 8   Creatinine 0.76 - 1.27 mg/dL 0.67(L) 0.72 0.69  Sodium 134 - 144 mmol/L 134 131(L) 134(L)  Potassium 3.5 - 5.2 mmol/L 4.1 4.0 3.7  Chloride 96 - 106 mmol/L 97 97(L) 100  CO2 20 - 29 mmol/L 22 25 24   Calcium 8.7 - 10.2 mg/dL 9.8 9.3 8.6(L)  Total Protein 6.5 - 8.1 g/dL - - -  Total Bilirubin 0.3 - 1.2 mg/dL - - -  Alkaline Phos 38 - 126 U/L - - -  AST 15 - 41 U/L - - -  ALT 0 - 44 U/L - - -   Lipid Panel     Component Value Date/Time   CHOL 161 06/28/2019 0939   TRIG 148 06/28/2019 0939   HDL 43 06/28/2019 0939   CHOLHDL 3.7 06/28/2019 0939   VLDL 30 06/28/2019 0939   LDLCALC 88 06/28/2019 0939  \ Preferred Learning Style:   No preference indicated   Learning Readiness:  Ready  Change in progress   MEDICATIONS:    DIETARY INTAKE:  24-hr recall:  B ( AM): BLT with 3 slices bacon with mayo on [employer] toast, coffee-2% milk and sweet n low Snk ( AM):   L ( PM): 3 chicken tenders, potato wedges,  Diet coke Snk ( PM): nabs, crackers  D ( PM): meatloaf, stewed potatoes, green beans, and cornbread muffin, Diet  coke Snk ( PM): occasionally crackers or nabs Beverages: DIet Sodas,   Usual physical activity:   Estimated energy needs: 1800 calories 200 g carbohydrates 135 g protein 50 g fat  Progress Towards Goal(s):  In progress.   Nutritional Diagnosis:  NB-1.1 Food and nutrition-related knowledge deficit As related to Diabetes and Obesity.  As evidenced by A1C 11% and BMI > 40.    Intervention:  Nutrition and Diabetes education provided on My Plate, CHO counting, meal planning, portion sizes, timing of meals, avoiding snacks between meals unless having a low blood sugar, target ranges for A1C and blood sugars, signs/symptoms and treatment of hyper/hypoglycemia, monitoring blood sugars, taking medications as prescribed, benefits of exercising 30 minutes per day and prevention of complications of DM.  Goals Drink a gallon of water per day Cut out diet sodas Cut out snacks between meals Increase lower carb vegetables with lunch and dinner Exercise/walk as you can Lose 1-2 lbs per week Cut out processed foods   Teaching Method Utilized:  Visual Auditory Hands on  Handouts given during visit include:  The Plate Method  Meal Plan  Low Cholesterol Diet  Low Salt     Barriers to learning/adherence to lifestyle change: Heart condition   Demonstrated degree of understanding via:  Teach Back   Monitoring/Evaluation:  Dietary intake, exercise, , and body weight in 1 month(s).

## 2019-09-19 ENCOUNTER — Other Ambulatory Visit: Payer: Self-pay | Admitting: Physician Assistant

## 2019-09-19 DIAGNOSIS — I350 Nonrheumatic aortic (valve) stenosis: Secondary | ICD-10-CM

## 2019-09-20 ENCOUNTER — Telehealth: Payer: Self-pay | Admitting: *Deleted

## 2019-09-20 NOTE — Telephone Encounter (Signed)
Pt contacted pre-catheterization scheduled at [facility] for:  Monday September 24, 2019 7:30 AM Verified arrival time and place: [facility]) at: 5:30 AM   No solid food after midnight prior to cath, clear liquids until 5 AM day of procedure. Contrast allergy: no  Hold: Janumet-day of procedure and 48 hours post procedure. Insulin-AM of procedure. 1/2 usual Lantus  Insulin PM prior to procedure.  Except hold medications AM meds can be  taken pre-cath with sip of water including: ASA 81 mg   Confirmed patient has responsible adult to drive home post procedure and observe 24 hours after arriving home: yes  Currently, due to Covid-19 pandemic, only one support person will be allowed with patient. Must be the same support person for that patient's entire stay, will be screened and required to wear a mask. They will be asked to wait in the waiting room for the duration of the patient's stay.  Patients are required to wear a mask when they enter the hospital.      COVID-19 Pre-Screening Questions:  . In the past 7 to 10 days have you had a cough,  shortness of breath, headache, congestion, fever (100 or greater) body aches, chills, sore throat, or sudden loss of taste or sense of smell? no . Have you been around anyone with known Covid 19? no . Have you been around anyone who is awaiting Covid 19 test results in the past 7 to 10 days? no . Have you been around anyone who has been exposed to Covid 19, or has mentioned symptoms of Covid 19 within the past 7 to 10 days? no   I reviewed procedure/mask/visitor instructions, Covid-19 screening questions with patient, he verbalized understanding, thanked me for call.

## 2019-09-22 ENCOUNTER — Encounter: Payer: Self-pay | Admitting: Physician Assistant

## 2019-09-22 ENCOUNTER — Other Ambulatory Visit (HOSPITAL_COMMUNITY)
Admission: RE | Admit: 2019-09-22 | Discharge: 2019-09-22 | Disposition: A | Discharge status: Home / Self Care | Payer: HRSA Program | Source: Ambulatory Visit | Attending: Cardiovascular Disease | Admitting: Cardiovascular Disease

## 2019-09-22 DIAGNOSIS — Z01812 Encounter for preprocedural laboratory examination: Secondary | ICD-10-CM | POA: Insufficient documentation

## 2019-09-22 DIAGNOSIS — Z20828 Contact with and (suspected) exposure to other viral communicable diseases: Secondary | ICD-10-CM | POA: Insufficient documentation

## 2019-09-22 LAB — SARS CORONAVIRUS 2 (TAT 6-24 HRS): SARS Coronavirus 2: NEGATIVE

## 2019-09-24 ENCOUNTER — Other Ambulatory Visit: Payer: Self-pay

## 2019-09-24 ENCOUNTER — Ambulatory Visit (HOSPITAL_COMMUNITY)
Admission: RE | Disposition: A | Discharge status: Home / Self Care | Payer: Self-pay | Source: Home / Self Care | Attending: Cardiovascular Disease

## 2019-09-24 ENCOUNTER — Ambulatory Visit (HOSPITAL_COMMUNITY)
Admission: RE | Admit: 2019-09-24 | Discharge: 2019-09-24 | Disposition: A | Discharge status: Home / Self Care | Payer: Self-pay | Attending: Cardiovascular Disease | Admitting: Cardiovascular Disease

## 2019-09-24 DIAGNOSIS — R0602 Shortness of breath: Secondary | ICD-10-CM | POA: Insufficient documentation

## 2019-09-24 DIAGNOSIS — I272 Pulmonary hypertension, unspecified: Secondary | ICD-10-CM | POA: Insufficient documentation

## 2019-09-24 DIAGNOSIS — Z89422 Acquired absence of other left toe(s): Secondary | ICD-10-CM | POA: Insufficient documentation

## 2019-09-24 DIAGNOSIS — Z6841 Body Mass Index (BMI) 40.0 and over, adult: Secondary | ICD-10-CM | POA: Insufficient documentation

## 2019-09-24 DIAGNOSIS — E119 Type 2 diabetes mellitus without complications: Secondary | ICD-10-CM | POA: Insufficient documentation

## 2019-09-24 DIAGNOSIS — Z79899 Other long term (current) drug therapy: Secondary | ICD-10-CM | POA: Insufficient documentation

## 2019-09-24 DIAGNOSIS — Z8249 Family history of ischemic heart disease and other diseases of the circulatory system: Secondary | ICD-10-CM | POA: Insufficient documentation

## 2019-09-24 DIAGNOSIS — Z7982 Long term (current) use of aspirin: Secondary | ICD-10-CM | POA: Insufficient documentation

## 2019-09-24 DIAGNOSIS — E7800 Pure hypercholesterolemia, unspecified: Secondary | ICD-10-CM | POA: Insufficient documentation

## 2019-09-24 DIAGNOSIS — I350 Nonrheumatic aortic (valve) stenosis: Secondary | ICD-10-CM

## 2019-09-24 DIAGNOSIS — I2510 Atherosclerotic heart disease of native coronary artery without angina pectoris: Secondary | ICD-10-CM | POA: Insufficient documentation

## 2019-09-24 DIAGNOSIS — I10 Essential (primary) hypertension: Secondary | ICD-10-CM | POA: Insufficient documentation

## 2019-09-24 DIAGNOSIS — Z794 Long term (current) use of insulin: Secondary | ICD-10-CM | POA: Insufficient documentation

## 2019-09-24 DIAGNOSIS — I2584 Coronary atherosclerosis due to calcified coronary lesion: Secondary | ICD-10-CM | POA: Insufficient documentation

## 2019-09-24 HISTORY — PX: RIGHT HEART CATH AND CORONARY ANGIOGRAPHY: CATH118264

## 2019-09-24 HISTORY — DX: Nonrheumatic aortic (valve) stenosis: I35.0

## 2019-09-24 LAB — POCT I-STAT EG7
Acid-Base Excess: 1 mmol/L (ref 0.0–2.0)
Acid-Base Excess: 1 mmol/L (ref 0.0–2.0)
Acid-Base Excess: 2 mmol/L (ref 0.0–2.0)
Bicarbonate: 26.2 mmol/L (ref 20.0–28.0)
Bicarbonate: 26.3 mmol/L (ref 20.0–28.0)
Bicarbonate: 27.5 mmol/L (ref 20.0–28.0)
Calcium, Ion: 1.08 mmol/L — ABNORMAL LOW (ref 1.15–1.40)
Calcium, Ion: 1.22 mmol/L (ref 1.15–1.40)
Calcium, Ion: 1.23 mmol/L (ref 1.15–1.40)
HCT: 38 % — ABNORMAL LOW (ref 39.0–52.0)
HCT: 40 % (ref 39.0–52.0)
HCT: 40 % (ref 39.0–52.0)
Hemoglobin: 12.9 g/dL — ABNORMAL LOW (ref 13.0–17.0)
Hemoglobin: 13.6 g/dL (ref 13.0–17.0)
Hemoglobin: 13.6 g/dL (ref 13.0–17.0)
O2 Saturation: 67 %
O2 Saturation: 71 %
O2 Saturation: 76 %
Potassium: 4.7 mmol/L (ref 3.5–5.1)
Potassium: 4.7 mmol/L (ref 3.5–5.1)
Potassium: 4.8 mmol/L (ref 3.5–5.1)
Sodium: 133 mmol/L — ABNORMAL LOW (ref 135–145)
Sodium: 134 mmol/L — ABNORMAL LOW (ref 135–145)
Sodium: 134 mmol/L — ABNORMAL LOW (ref 135–145)
TCO2: 28 mmol/L (ref 22–32)
TCO2: 28 mmol/L (ref 22–32)
TCO2: 29 mmol/L (ref 22–32)
pCO2, Ven: 44.3 mmHg (ref 44.0–60.0)
pCO2, Ven: 44.4 mmHg (ref 44.0–60.0)
pCO2, Ven: 45.2 mmHg (ref 44.0–60.0)
pH, Ven: 7.379 (ref 7.250–7.430)
pH, Ven: 7.381 (ref 7.250–7.430)
pH, Ven: 7.391 (ref 7.250–7.430)
pO2, Ven: 36 mmHg (ref 32.0–45.0)
pO2, Ven: 38 mmHg (ref 32.0–45.0)
pO2, Ven: 42 mmHg (ref 32.0–45.0)

## 2019-09-24 LAB — GLUCOSE, CAPILLARY: Glucose-Capillary: 170 mg/dL — ABNORMAL HIGH (ref 70–99)

## 2019-09-24 LAB — POCT I-STAT 7, (LYTES, BLD GAS, ICA,H+H)
Acid-Base Excess: 2 mmol/L (ref 0.0–2.0)
Bicarbonate: 26.4 mmol/L (ref 20.0–28.0)
Calcium, Ion: 1.2 mmol/L (ref 1.15–1.40)
HCT: 40 % (ref 39.0–52.0)
Hemoglobin: 13.6 g/dL (ref 13.0–17.0)
O2 Saturation: 99 %
Potassium: 4.7 mmol/L (ref 3.5–5.1)
Sodium: 134 mmol/L — ABNORMAL LOW (ref 135–145)
TCO2: 28 mmol/L (ref 22–32)
pCO2 arterial: 41.3 mmHg (ref 32.0–48.0)
pH, Arterial: 7.413 (ref 7.350–7.450)
pO2, Arterial: 123 mmHg — ABNORMAL HIGH (ref 83.0–108.0)

## 2019-09-24 SURGERY — RIGHT HEART CATH AND CORONARY ANGIOGRAPHY
Anesthesia: LOCAL

## 2019-09-24 MED ORDER — SODIUM CHLORIDE 0.9 % WEIGHT BASED INFUSION: 1.0000 mL/kg/h | INTRAVENOUS | Status: DC

## 2019-09-24 MED ORDER — LABETALOL HCL 5 MG/ML IV SOLN: 10.0000 mg | INTRAVENOUS | Status: DC | PRN

## 2019-09-24 MED ORDER — FENTANYL CITRATE (PF) 100 MCG/2ML IJ SOLN
INTRAMUSCULAR | Status: AC
  Filled 2019-09-24: qty 2

## 2019-09-24 MED ORDER — SODIUM CHLORIDE 0.9 % WEIGHT BASED INFUSION
3.0000 mL/kg/h | INTRAVENOUS | Status: AC
  Administered 2019-09-24: 3 mL/kg/h via INTRAVENOUS

## 2019-09-24 MED ORDER — SODIUM CHLORIDE 0.9 % IV SOLN: 250.0000 mL | INTRAVENOUS | Status: DC | PRN

## 2019-09-24 MED ORDER — LIDOCAINE HCL (PF) 1 % IJ SOLN
INTRAMUSCULAR | Status: DC | PRN
  Administered 2019-09-24 (×2): 3 mL

## 2019-09-24 MED ORDER — HEPARIN SODIUM (PORCINE) 1000 UNIT/ML IJ SOLN
INTRAMUSCULAR | Status: AC
  Filled 2019-09-24: qty 1

## 2019-09-24 MED ORDER — HEPARIN SODIUM (PORCINE) 1000 UNIT/ML IJ SOLN
INTRAMUSCULAR | Status: DC | PRN
  Administered 2019-09-24: 7000 [IU] via INTRAVENOUS

## 2019-09-24 MED ORDER — IOHEXOL 350 MG/ML SOLN
INTRAVENOUS | Status: DC | PRN
  Administered 2019-09-24: 70 mL

## 2019-09-24 MED ORDER — VERAPAMIL HCL 2.5 MG/ML IV SOLN
INTRAVENOUS | Status: DC | PRN
  Administered 2019-09-24: 10 mL via INTRA_ARTERIAL

## 2019-09-24 MED ORDER — SODIUM CHLORIDE 0.9 % IV SOLN: INTRAVENOUS | Status: DC

## 2019-09-24 MED ORDER — SODIUM CHLORIDE 0.9% FLUSH: 3.0000 mL | Freq: Two times a day (BID) | INTRAVENOUS | Status: DC

## 2019-09-24 MED ORDER — LIDOCAINE HCL (PF) 1 % IJ SOLN
INTRAMUSCULAR | Status: AC
  Filled 2019-09-24: qty 30

## 2019-09-24 MED ORDER — HEPARIN (PORCINE) IN NACL 1000-0.9 UT/500ML-% IV SOLN
INTRAVENOUS | Status: DC | PRN
  Administered 2019-09-24: 500 mL

## 2019-09-24 MED ORDER — SODIUM CHLORIDE 0.9% FLUSH: 3.0000 mL | INTRAVENOUS | Status: DC | PRN

## 2019-09-24 MED ORDER — ONDANSETRON HCL 4 MG/2ML IJ SOLN: 4.0000 mg | Freq: Four times a day (QID) | INTRAMUSCULAR | Status: DC | PRN

## 2019-09-24 MED ORDER — MIDAZOLAM HCL 2 MG/2ML IJ SOLN
INTRAMUSCULAR | Status: AC
  Filled 2019-09-24: qty 2

## 2019-09-24 MED ORDER — MIDAZOLAM HCL 2 MG/2ML IJ SOLN
INTRAMUSCULAR | Status: DC | PRN
  Administered 2019-09-24: 2 mg via INTRAVENOUS

## 2019-09-24 MED ORDER — ASPIRIN 81 MG PO CHEW: 81.0000 mg | CHEWABLE_TABLET | ORAL | Status: DC

## 2019-09-24 MED ORDER — FENTANYL CITRATE (PF) 100 MCG/2ML IJ SOLN
INTRAMUSCULAR | Status: DC | PRN
  Administered 2019-09-24: 25 ug via INTRAVENOUS

## 2019-09-24 MED ORDER — HYDRALAZINE HCL 20 MG/ML IJ SOLN: 10.0000 mg | INTRAMUSCULAR | Status: DC | PRN

## 2019-09-24 MED ORDER — HEPARIN (PORCINE) IN NACL 1000-0.9 UT/500ML-% IV SOLN
INTRAVENOUS | Status: AC
  Filled 2019-09-24: qty 1000

## 2019-09-24 MED ORDER — VERAPAMIL HCL 2.5 MG/ML IV SOLN
INTRAVENOUS | Status: AC
  Filled 2019-09-24: qty 2

## 2019-09-24 MED ORDER — ACETAMINOPHEN 325 MG PO TABS: 650.0000 mg | ORAL_TABLET | ORAL | Status: DC | PRN

## 2019-09-24 SURGICAL SUPPLY — 14 items
CATH 5FR JL3.5 JR4 ANG PIG MP (CATHETERS) ×2 IMPLANT
CATH BALLN WEDGE 5F 110CM (CATHETERS) ×2 IMPLANT
CATH INFINITI 5FR JL4 (CATHETERS) ×2 IMPLANT
CATH INFINITI JR4 5F (CATHETERS) ×2 IMPLANT
DEVICE RAD COMP TR BAND LRG (VASCULAR PRODUCTS) ×2 IMPLANT
GLIDESHEATH SLEND SS 6F .021 (SHEATH) ×2 IMPLANT
GUIDEWIRE .025 260CM (WIRE) ×2 IMPLANT
GUIDEWIRE INQWIRE 1.5J.035X260 (WIRE) ×1 IMPLANT
INQWIRE 1.5J .035X260CM (WIRE) ×2
KIT HEART LEFT (KITS) ×2 IMPLANT
PACK CARDIAC CATHETERIZATION (CUSTOM PROCEDURE TRAY) ×2 IMPLANT
SHEATH GLIDE SLENDER 4/5FR (SHEATH) ×2 IMPLANT
TRANSDUCER W/STOPCOCK (MISCELLANEOUS) ×2 IMPLANT
TUBING CIL FLEX 10 FLL-RA (TUBING) ×2 IMPLANT

## 2019-09-24 NOTE — Interval H&P Note (Signed)
History and Physical Interval Note:  09/24/2019 7:35 AM  Adam Long  has presented today for surgery, with the diagnosis of arotic stenosis.  The various methods of treatment have been discussed with the patient and family. After consideration of risks, benefits and other options for treatment, the patient has consented to  Procedure(s): RIGHT/LEFT HEART CATH AND CORONARY ANGIOGRAPHY (N/A) as a surgical intervention.  The patient's history has been reviewed, patient examined, no change in status, stable for surgery.  I have reviewed the patient's chart and labs.  Questions were answered to the patient's satisfaction.     Sherren Mocha

## 2019-09-24 NOTE — Discharge Instructions (Signed)
HOLD JANUMET/METFORMIN FOR A FULL 48 HOURS AFTER DISCHARGE.  DRINK PLENTY OF FLUIDS FOR THE NEXT 2-3 DAYS.  KEEP ARM ELEVATED THE REMAINDER OF THE DAY.   Radial Site Care  This sheet gives you information about how to care for yourself after your procedure. Your health care provider may also give you more specific instructions. If you have problems or questions, contact your health care provider. What can I expect after the procedure? After the procedure, it is common to have:  Bruising and tenderness at the catheter insertion area. Follow these instructions at home: Medicines  Take over-the-counter and prescription medicines only as told by your health care provider. Insertion site care  Follow instructions from your health care provider about how to take care of your insertion site. Make sure you: ? Wash your hands with soap and water before you change your bandage (dressing). If soap and water are not available, use hand sanitizer. ? Change your dressing as told by your health care provider.  Check your insertion site every day for signs of infection. Check for: ? Redness, swelling, or pain. ? Fluid or blood. ? Pus or a bad smell. ? Warmth.  Do not take baths, swim, or use a hot tub until your health care provider approves.  You may shower 24-48 hours after the procedure. ? Remove the dressing and gently wash the site with plain soap and water. ? Pat the area dry with a clean towel. ? Do not rub the site. That could cause bleeding.  Do not apply powder or lotion to the site. Activity   For 24 hours after the procedure, or as directed by your health care provider: ? Do not flex or bend the affected arm. ? Do not push or pull heavy objects with the affected arm. ? Do not drive yourself home from the hospital or clinic. You may drive 24 hours after the procedure unless your health care provider tells you not to. ? Do not operate machinery or power tools.  Do not lift  anything that is heavier than 10 lb for 5 days.  Ask your health care provider when it is okay to: ? Return to work or school. ? Resume usual physical activities or sports. ? Resume sexual activity. General instructions  If the catheter site starts to bleed, raise your arm and put firm pressure on the site. If the bleeding does not stop, get help right away. This is a medical emergency.  If you went home on the same day as your procedure, a responsible adult should be with you for the first 24 hours after you arrive home.  Keep all follow-up visits as told by your health care provider. This is important. Contact a health care provider if:  You have a fever.  You have redness, swelling, or yellow drainage around your insertion site. Get help right away if:  You have unusual pain at the radial site.  The catheter insertion area swells very fast.  The insertion area is bleeding, and the bleeding does not stop when you hold steady pressure on the area.  Your arm or hand becomes pale, cool, tingly, or numb. These symptoms may represent a serious problem that is an emergency. Do not wait to see if the symptoms will go away. Get medical help right away. Call your local emergency services (911 in the [place] Do not drive yourself to the hospital. Summary  After the procedure, it is common to have bruising and tenderness at the  site.  Follow instructions from your health care provider about how to take care of your radial site wound. Check the wound every day for signs of infection.  Do not lift anything that is heavier than 10 lb for 5 days.  This information is not intended to replace advice given to you by your health care provider. Make sure you discuss any questions you have with your health care provider. Document Released: 10/16/2010 Document Revised: 10/19/2017 Document Reviewed: 10/19/2017 Elsevier Patient Education  2020 [employer].

## 2019-09-25 ENCOUNTER — Telehealth (HOSPITAL_COMMUNITY): Payer: Self-pay | Admitting: Licensed Clinical Social Worker

## 2019-09-25 MED FILL — Heparin Sod (Porcine)-NaCl IV Soln 1000 Unit/500ML-0.9%: INTRAVENOUS | Qty: 500 | Status: AC

## 2019-09-25 NOTE — Telephone Encounter (Signed)
CSW received referral to assist patient with insurance. Patient contacted and reports he has a pending medicaid application. CSW asked patient to provide name and number of caseworker and CSW will follow up to assist with needed information for review of case. Patient will return call to CSW with information when he gets home. Raquel Sarna, [place], [place]

## 2019-10-02 ENCOUNTER — Other Ambulatory Visit: Payer: Self-pay | Admitting: Cardiovascular Disease

## 2019-10-02 ENCOUNTER — Other Ambulatory Visit: Payer: Self-pay

## 2019-10-02 ENCOUNTER — Ambulatory Visit (HOSPITAL_COMMUNITY)
Admission: RE | Admit: 2019-10-02 | Discharge: 2019-10-02 | Disposition: A | Discharge status: Home / Self Care | Payer: Self-pay | Source: Ambulatory Visit | Attending: Cardiovascular Disease | Admitting: Cardiovascular Disease

## 2019-10-02 ENCOUNTER — Ambulatory Visit: Payer: Self-pay | Attending: Physician Assistant | Admitting: Physical Therapy

## 2019-10-02 ENCOUNTER — Encounter: Payer: Self-pay | Admitting: Physical Therapy

## 2019-10-02 DIAGNOSIS — I350 Nonrheumatic aortic (valve) stenosis: Secondary | ICD-10-CM

## 2019-10-02 DIAGNOSIS — R2689 Other abnormalities of gait and mobility: Secondary | ICD-10-CM | POA: Insufficient documentation

## 2019-10-02 NOTE — Therapy (Signed)
[place], [place], [address] Phone: [phone]   Fax:  [phone]  Physical Therapy Evaluation  Patient Details  Name: LIMMIE SCHOENBERG MRN: [id number] Date of Birth: [birth year] Referring Provider (PT): Eileen Stanford, [place]   Encounter Date: 10/02/2019  PT End of Session - 10/02/19 7253    Visit Number  1    Number of Visits  1    Authorization Type  Self pay    PT Start Time  0830    PT Stop Time  0855    PT Time Calculation (min)  25 min    Equipment Utilized During Treatment  Gait belt    Activity Tolerance  Patient tolerated treatment well    Behavior During Therapy  [facility] for tasks assessed/performed       Past Medical History:  Diagnosis Date  . Anxiety   . Cholelithiasis   . Depression   . Diabetes mellitus   . Hypercholesteremia   . Hypertension   . Morbid obesity ([place])     Past Surgical History:  Procedure Laterality Date  . ABDOMINAL AORTOGRAM W/LOWER EXTREMITY N/A 07/06/2019   Procedure: ABDOMINAL AORTOGRAM W/LOWER EXTREMITY;  Surgeon: Elam Dutch, MD;  Location: [facility];  Service: Cardiovascular;  Laterality: N/A;  Bilateral  . AMPUTATION Left 07/09/2019   Procedure: LEFT FOURTH and Fifth TOE AMPUTATION.;  Surgeon: Rosetta Posner, MD;  Location: [facility];  Service: Vascular;  Laterality: Left;  . RIGHT HEART CATH AND CORONARY ANGIOGRAPHY N/A 09/24/2019   Procedure: RIGHT HEART CATH AND CORONARY ANGIOGRAPHY;  Surgeon: Sherren Mocha, MD;  Location: [facility];  Service: Cardiovascular;  Laterality: N/A;    There were no vitals filed for this visit.   Subjective Assessment - 10/02/19 0827    Subjective  Patient exhibits worsening shortness of breath with activity.    Pertinent History  DM, HTN, Hyperlipidemia    Limitations  Walking;House hold activities    How long can you sit comfortably?  No limitation    How long can you stand comfortably?  No  limitation    How long can you walk comfortably?  "A few minutes"    Patient Stated Goals  Get heart better    Currently in Pain?  No/denies         [facility] PT Assessment - 10/02/19 0001      Assessment   Medical Diagnosis  Severe Aortic Stenosis    Referring Provider (PT)  Eileen Stanford, PA-C    Onset Date/Surgical Date  --   5-6 months ago   Hand Dominance  Right    Prior Therapy  No      Precautions   Precautions  None      Restrictions   Weight Bearing Restrictions  No      Balance Screen   Has the patient fallen in the past 6 months  No    Has the patient had a decrease in activity level because of a fear of falling?   No    Is the patient reluctant to leave their home because of a fear of falling?   No      Home Film/video editor residence    Living Arrangements  Parent    Type of [place] to enter      Prior Function   Level of Independence  Independent  Vocation  Unemployed      Cognition   Overall Cognitive Status  Within Functional Limits for tasks assessed      Observation/Other Assessments   Observations  Patient appears in no apparent distress    Focus on Therapeutic Outcomes (FOTO)   NA      Posture/Postural Control   Posture Comments  Patient exhibits rounded shoulder and forward head posture      ROM / Strength   AROM / PROM / Strength  AROM;Strength      AROM   Overall AROM Comments  Grossly WFL throughout UE and LE      Strength   Overall Strength Comments  Grossly 4+/5 throughout UE and LE    Strength Assessment Site  Hand    Right/Left hand  Right;Left    Right Hand Grip (lbs)  40    Left Hand Grip (lbs)  38      Transfers   Transfers  Independent with all Transfers      Ambulation/Gait   Ambulation/Gait  Yes    Ambulation/Gait Assistance  7: Independent    Gait Comments  Compensated trendelenburg bilaterally, decreased step length, bilateral toe out       [facility]  Pre-Surgical Assessment - 10/02/19 0001    5 Meter Walk Test- trial 1  4 sec    5 Meter Walk Test- trial 2  4 sec.     5 Meter Walk Test- trial 3  4 sec.    5 meter walk test average  4 sec    4 Stage Balance Test tolerated for:   10 sec.    4 Stage Balance Test Position  3    Sit To Stand Test- trial 1  14 sec.    Comment  denies dyspnea    ADL/IADL Independent with:  Bathing;Dressing;Meal prep;Finances;Yard work    ADL/IADL Freight forwarder Index  Vulnerable    6 Minute Walk- Baseline  yes    BP (mmHg)  135/80    HR (bpm)  72    02 Sat (%RA)  99 %    Modified Borg Scale for Dyspnea  0- Nothing at all    Perceived Rate of Exertion (Borg)  6-    6 Minute Walk Post Test  yes    BP (mmHg)  135/82    HR (bpm)  80    02 Sat (%RA)  95 %    Modified Borg Scale for Dyspnea  3- Moderate shortness of breath or breathing difficulty    Perceived Rate of Exertion (Borg)  12-    Aerobic Endurance Distance Walked  925              Objective measurements completed on examination: See above findings.              PT Education - 10/02/19 0828    Education Details  Actvity modification    Person(s) Educated  Patient    Methods  Explanation    Comprehension  Verbalized understanding                  Plan - 10/02/19 0829    Clinical Impression Statement  See below    Personal Factors and Comorbidities  Comorbidity 3+;Fitness;Social Background    Comorbidities  DM, HTN, Hyperlipidemia    Examination-Participation Restrictions  Community Activity;Yard Work    Stability/Clinical Decision Making  Stable/Uncomplicated    Clinical Decision Making  Low    Rehab Potential  Good    PT  Frequency  One time visit    PT Treatment/Interventions  Therapeutic exercise;Gait training;Energy conservation    PT Next Visit Plan  NA    PT Home Exercise Plan  NA    Consulted and Agree with Plan of Care  Patient      Clinical Impression Statement: Pt is a 44 yo male presenting to OP PT  for evaluation prior to possible TAVR surgery due to severe aortic stenosis. Pt reports onset of shortness of breath approximately 5-6 months ago. Symptoms are limiting walking and household activity. Pt presents with good ROM and strength, good balance and is not at high fall risk 4 stage balance test, good walking speed and poor aerobic endurance per 6 minute walk test. Pt ambulated 555 feet in 2:35 before requesting a seated rest beak lasting 1:25. At time of rest, patient's HR was 80 bpm and O2 was 91 on room air. Pt reported 2/10 shortness of breath on modified scale for dyspnea. Pt able to resume after rest and ambulate an additional 370 feet. Pt ambulated a total of 925 feet in 6 minute walk. Shortness of breath increased significantly with 6 minute walk test. Based on the Short Physical Performance Battery, patient has a frailty rating of 10/12 with </= 5/12 considered frail.   Patient demonstrates the following deficits and impairments:  Abnormal gait, Cardiopulmonary status limiting activity, Decreased endurance, Decreased activity tolerance, Postural dysfunction  Visit Diagnosis: Other abnormalities of gait and mobility     Problem List Patient Active Problem List   Diagnosis Date Noted  . Severe aortic stenosis 09/24/2019  . Gangrene of toe of left foot (HCC) 07/06/2019  . Cellulitis of fourth toe of left foot   . Cellulitis and abscess of lower extremity 06/11/2019  . Personal history of noncompliance with medical treatment, presenting hazards to health 03/16/2018  . Uncontrolled type 2 diabetes mellitus with complication (HCC) 10/12/2015  . Mixed hyperlipidemia 10/12/2015  . Morbid obesity (HCC) 10/12/2015  . Essential hypertension, benign 10/12/2015  . Chest pain 07/02/2013  . HTN (hypertension) 07/02/2013  . DM (diabetes mellitus) (HCC) 07/02/2013    Rosana Hoes, PT, DPT, LAT, ATC 10/02/19  9:09 AM Phone: [phone] Fax: [phone]   [facility] Out[facility]  Re[facility] [address] [place], [place], [address] Phone: [phone]   Fax:  [phone]  Name: DONN ZANETTI MRN: [id number] Date of Birth: 1976-04-14

## 2019-10-04 ENCOUNTER — Other Ambulatory Visit (HOSPITAL_COMMUNITY): Payer: Self-pay | Admitting: Dentistry

## 2019-10-05 ENCOUNTER — Other Ambulatory Visit: Payer: Self-pay

## 2019-10-05 ENCOUNTER — Ambulatory Visit (HOSPITAL_COMMUNITY)
Admission: RE | Admit: 2019-10-05 | Discharge: 2019-10-05 | Disposition: A | Discharge status: Home / Self Care | Payer: Self-pay | Source: Ambulatory Visit | Attending: Cardiovascular Disease | Admitting: Cardiovascular Disease

## 2019-10-05 DIAGNOSIS — I350 Nonrheumatic aortic (valve) stenosis: Secondary | ICD-10-CM

## 2019-10-05 HISTORY — PX: IR RADIOLOGY PERIPHERAL GUIDED IV START: IMG5598

## 2019-10-05 HISTORY — PX: IR US GUIDE VASC ACCESS RIGHT: IMG2390

## 2019-10-05 MED ORDER — LIDOCAINE HCL (PF) 1 % IJ SOLN
INTRAMUSCULAR | Status: DC | PRN
  Administered 2019-10-05: 5 mL

## 2019-10-05 MED ORDER — LIDOCAINE HCL 1 % IJ SOLN
INTRAMUSCULAR | Status: AC
  Filled 2019-10-05: qty 20

## 2019-10-05 MED ORDER — IOHEXOL 350 MG/ML SOLN
100.0000 mL | Freq: Once | INTRAVENOUS | Status: AC | PRN
  Administered 2019-10-05: 100 mL via INTRAVENOUS

## 2019-10-08 ENCOUNTER — Other Ambulatory Visit: Payer: Self-pay | Admitting: Cardiovascular Disease

## 2019-10-08 ENCOUNTER — Encounter (HOSPITAL_COMMUNITY): Payer: Self-pay

## 2019-10-08 ENCOUNTER — Other Ambulatory Visit: Payer: Self-pay

## 2019-10-08 ENCOUNTER — Ambulatory Visit (HOSPITAL_COMMUNITY): Payer: Self-pay | Admitting: Dentistry

## 2019-10-08 VITALS — BP 121/77 | HR 79 | Temp 98.8°F

## 2019-10-08 DIAGNOSIS — M264 Malocclusion, unspecified: Secondary | ICD-10-CM

## 2019-10-08 DIAGNOSIS — F40232 Fear of other medical care: Secondary | ICD-10-CM

## 2019-10-08 DIAGNOSIS — K0889 Other specified disorders of teeth and supporting structures: Secondary | ICD-10-CM

## 2019-10-08 DIAGNOSIS — K036 Deposits [accretions] on teeth: Secondary | ICD-10-CM

## 2019-10-08 DIAGNOSIS — K08409 Partial loss of teeth, unspecified cause, unspecified class: Secondary | ICD-10-CM

## 2019-10-08 DIAGNOSIS — K083 Retained dental root: Secondary | ICD-10-CM

## 2019-10-08 DIAGNOSIS — K0530 Chronic periodontitis, unspecified: Secondary | ICD-10-CM

## 2019-10-08 DIAGNOSIS — I350 Nonrheumatic aortic (valve) stenosis: Secondary | ICD-10-CM

## 2019-10-08 DIAGNOSIS — K045 Chronic apical periodontitis: Secondary | ICD-10-CM

## 2019-10-08 DIAGNOSIS — K029 Dental caries, unspecified: Secondary | ICD-10-CM

## 2019-10-08 DIAGNOSIS — K06010 Localized gingival recession, unspecified: Secondary | ICD-10-CM

## 2019-10-08 DIAGNOSIS — Z01818 Encounter for other preprocedural examination: Secondary | ICD-10-CM

## 2019-10-08 NOTE — Patient Instructions (Signed)
COVID-19 Education: The signs and symptoms of COVID-19 were discussed with the patient and how to seek care for testing (follow up with PCP or arrange E-visit).   The importance of social distancing was discussed today.               Department of Dental Medicine     DR. Timara Loma      HEART VALVES AND MOUTH CARE:  FACTS:   If you have any infection in your mouth, it can infect your heart valve.  If you heart valve is infected, you will be seriously ill.  Infections in the mouth can be SILENT and do not always cause pain.  Examples of infections in the mouth are gum disease, dental cavities, and abscesses.  Some possible signs of infection are: Bad breath, bleeding gums, or teeth that are sensitive to sweets, hot, and/or cold. There are many other signs as well.  WHAT YOU HAVE TO DO:   Brush your teeth after meals and at bedtime. Spend at least 2 minutes brushing well, especially behind your back teeth and all around your teeth that stand alone. Brush at the gumline also.  Do not go to bed without brushing your teeth and flossing.  If you gums bleed when you brush or floss, do NOT stop brushing or flossing. It usually means that your gums need more attention and better cleaning.   If your Dentist or Dr. Alecea Trego gave you a prescription mouthwash to use, make sure to use it as directed. If you run out of the medication, get a refill at the pharmacy.   If you were given any other medications or directions by your Dentist, please follow them. If you did not understand the directions or forget what you were told, please call. We will be happy to refresh her memory.  If you need antibiotics before dental procedures, make sure you take them one hour prior to every dental visit as directed.   Get a dental checkup every 4-6 months in order to keep your mouth healthy, or to find and treat any new infection. You will most likely need your teeth cleaned or gums treated at the  same time.  If you are not able to come in for your scheduled appointment, call your Dentist as soon as possible to reschedule.  If you have a problem in between dental visits, call your Dentist.  

## 2019-10-08 NOTE — Progress Notes (Signed)
DENTAL CONSULTATION  Date of Consultation:  10/08/2019 Patient Name:   Adam Long Date of Birth:   [birth year] Medical Record Number: [id number]  COVID 19 SCREENING: The patient does not symptoms concerning for COVID-19 infection (Including fever, chills, cough, or new SHORTNESS OF BREATH).    VITALS: BP 121/77 (BP Location: Right Arm)   Pulse 79   Temp 98.8 F (37.1 C)   CHIEF COMPLAINT: Patient referred by Dr. Cornelius Moras for a dental consultation.  HPI: Adam Long is a 44 year old male recently diagnosed with severe aortic stenosis.  Patient with anticipated aortic valve replacement with Dr. Cornelius Moras in the future.  Patient is now seen as part of a medically necessary preheart valve surgery dental protocol examination to rule out dental infection that may affect the patient's systemic health and anticipated heart valve surgery.  The patient currently denies acute toothaches, swellings, or abscesses.  Patient has not been seen by dentist for over 10 years.  Patient had dental extractions at that time in [place], [place].  There were no complications with the dental extractions.  Patient denies having partial dentures.  Patient does have dental phobia.  PROBLEM LIST: Patient Active Problem List   Diagnosis Date Noted  . Severe aortic stenosis 09/24/2019    Priority: High  . Gangrene of toe of left foot (HCC) 07/06/2019  . Cellulitis of fourth toe of left foot   . Cellulitis and abscess of lower extremity 06/11/2019  . Personal history of noncompliance with medical treatment, presenting hazards to health 03/16/2018  . Uncontrolled type 2 diabetes mellitus with complication (HCC) 10/12/2015  . Mixed hyperlipidemia 10/12/2015  . Morbid obesity (HCC) 10/12/2015  . Essential hypertension, benign 10/12/2015  . Chest pain 07/02/2013  . HTN (hypertension) 07/02/2013  . DM (diabetes mellitus) (HCC) 07/02/2013    PMH: Past Medical History:  Diagnosis Date  . Anxiety   .  Cholelithiasis   . Depression   . Diabetes mellitus   . Hypercholesteremia   . Hypertension   . Morbid obesity (HCC)     PSH: Past Surgical History:  Procedure Laterality Date  . ABDOMINAL AORTOGRAM W/LOWER EXTREMITY N/A 07/06/2019   Procedure: ABDOMINAL AORTOGRAM W/LOWER EXTREMITY;  Surgeon: Sherren Kerns, MD;  Location: [facility] INVASIVE CV LAB;  Service: Cardiovascular;  Laterality: N/A;  Bilateral  . AMPUTATION Left 07/09/2019   Procedure: LEFT FOURTH and Fifth TOE AMPUTATION.;  Surgeon: Larina Earthly, MD;  Location: [facility] OR;  Service: Vascular;  Laterality: Left;  . IR RADIOLOGY PERIPHERAL GUIDED IV START  10/05/2019  . IR US GUIDE VASC ACCESS RIGHT  10/05/2019  . RIGHT HEART CATH AND CORONARY ANGIOGRAPHY N/A 09/24/2019   Procedure: RIGHT HEART CATH AND CORONARY ANGIOGRAPHY;  Surgeon: Tonny Bollman, MD;  Location: [facility] INVASIVE CV LAB;  Service: Cardiovascular;  Laterality: N/A;    ALLERGIES: Allergies  Allergen Reactions  . Pollen Extract Other (See Comments)    Runny nose, watery eyes, sneezing    MEDICATIONS: Current Outpatient Medications  Medication Sig Dispense Refill  . aspirin EC 81 MG tablet Take 81 mg by mouth daily.    Marland Kitchen atorvastatin (LIPITOR) 20 MG tablet Take 1 tablet (20 mg total) by mouth daily. 90 tablet 0  . insulin aspart (NOVOLOG FLEXPEN) 100 UNIT/ML FlexPen Inject 6-12 Units into the skin 3 (three) times daily before meals. (Patient taking differently: Inject 6-12 Units into the skin 3 (three) times daily before meals. Sliding scale) 5 pen 2  . Insulin Glargine (LANTUS SOLOSTAR) 100  UNIT/ML Solostar Pen Inject 20 Units into the skin at bedtime. (Patient taking differently: Inject 30 Units into the skin at bedtime. ) 5 pen 2  . lisinopril (ZESTRIL) 20 MG tablet Take 1 tablet (20 mg total) by mouth daily. 90 tablet 0  . metoprolol tartrate (LOPRESSOR) 50 MG tablet Take 1 tablet (50 mg total) by mouth 2 (two) times daily. 180 tablet 0  . sitaGLIPtin-metformin  (JANUMET) 50-500 MG tablet Take 1 tablet by mouth 2 (two) times daily with a meal. 180 tablet 0   No current facility-administered medications for this visit.    LABS: Lab Results  Component Value Date   WBC 10.4 09/10/2019   HGB 13.6 09/24/2019   HCT 40.0 09/24/2019   MCV 88 09/10/2019   PLT 265 09/10/2019      Component Value Date/Time   NA 134 (L) 09/24/2019 0802   NA 134 09/10/2019 1231   K 4.7 09/24/2019 0802   CL 97 09/10/2019 1231   CO2 22 09/10/2019 1231   GLUCOSE 194 (H) 09/10/2019 1231   GLUCOSE 195 (H) 07/13/2019 1329   BUN 12 09/10/2019 1231   CREATININE 0.67 (L) 09/10/2019 1231   CREATININE 0.77 01/07/2017 1040   CALCIUM 9.8 09/10/2019 1231   GFRNONAA 118 09/10/2019 1231   GFRNONAA >89 04/08/2016 0903   GFRAA 136 09/10/2019 1231   GFRAA >89 04/08/2016 0903   Lab Results  Component Value Date   INR 1.1 06/11/2019   No results found for: PTT  SOCIAL HISTORY: Social History   Socioeconomic History  . Marital status: Single    Spouse name: Not on file  . Number of children: Not on file  . Years of education: Not on file  . Highest education level: Not on file  Occupational History  . Not on file  Tobacco Use  . Smoking status: Never Smoker  . Smokeless tobacco: Former Neurosurgeon    Types: Chew  Substance and Sexual Activity  . Alcohol use: Yes    Comment: occasionally  . Drug use: No  . Sexual activity: Not on file  Other Topics Concern  . Not on file  Social History Narrative  . Not on file   Social Determinants of Health   Financial Resource Strain:   . Difficulty of Paying Living Expenses: Not on file  Food Insecurity:   . Worried About Programme researcher, broadcasting/film/video in the Last Year: Not on file  . Ran Out of Food in the Last Year: Not on file  Transportation Needs:   . Lack of Transportation (Medical): Not on file  . Lack of Transportation (Non-Medical): Not on file  Physical Activity:   . Days of Exercise per Week: Not on file  . Minutes of  Exercise per Session: Not on file  Stress:   . Feeling of Stress : Not on file  Social Connections:   . Frequency of Communication with Friends and Family: Not on file  . Frequency of Social Gatherings with Friends and Family: Not on file  . Attends Religious Services: Not on file  . Active Member of Clubs or Organizations: Not on file  . Attends Banker Meetings: Not on file  . Marital Status: Not on file  Intimate Partner Violence:   . Fear of Current or Ex-Partner: Not on file  . Emotionally Abused: Not on file  . Physically Abused: Not on file  . Sexually Abused: Not on file    FAMILY HISTORY: Family History  Problem Relation  Age of Onset  . Cancer Mother        brain  . Heart disease Father   . Hyperlipidemia Father   . Hypertension Father   . Stroke Father   . Heart murmur Sister     REVIEW OF SYSTEMS: Reviewed with the patient as per History of present illness. Psych: Patient denies having dental phobia.  DENTAL HISTORY: CHIEF COMPLAINT: Patient referred by Dr. Cornelius Moras for a dental consultation.  HPI: Adam Long is a 44 year old male recently diagnosed with severe aortic stenosis.  Patient with anticipated aortic valve replacement with Dr. Cornelius Moras in the future.  Patient is now seen as part of a medically necessary preheart valve surgery dental protocol examination to rule out dental infection that may affect the patient's systemic health and anticipated heart valve surgery.  The patient currently denies acute toothaches, swellings, or abscesses.  Patient has not been seen by dentist for over 10 years.  Patient had dental extractions at that time in [place], [place].  There were no complications with the dental extractions.  Patient denies having partial dentures.  Patient does have dental phobia.  DENTAL EXAMINATION: GENERAL: Patient is a well-developed, obese male in no acute distress. HEAD AND NECK: There is no obvious neck lymphadenopathy.  The  patient denies acute TMJ symptoms.  The patient has multiple facial moles have not changed by patient report. INTRAORAL EXAM: The patient has normal saliva.  There is atrophy of the edentulous alveolar ridges. DENTITION: Patient with multiple missing teeth numbers 1, 2, 4, 12-18, and 29-31.  There are retained root segments in the area of tooth numbers 6 -11, 19-23, 28, and 32. PERIODONTAL: Patient has chronic periodontitis with plaque and calculus accumulations, gingival recession, and moderate bone loss.  Tooth mobility is noted. DENTAL CARIES/SUBOPTIMAL RESTORATIONS: Patient has multiple dental caries noted. ENDODONTIC: Patient currently denies acute vulvitis symptoms.  Patient has multiple areas of periapical pathology and radiolucency. CROWN AND BRIDGE: There are no chronic bridge restorations. PROSTHODONTIC: Patient denies having partial dentures. OCCLUSION: Patient has a poor occlusal scheme secondary to multiple missing teeth, multiple retained root segments, and lack of replacement of the missing teeth with dental prostheses.  RADIOGRAPHIC INTERPRETATION: Orthopantogram was taken and supplemented with a full series of dental radiographs.  These are suboptimal secondary to lack of patient cooperation. There are multiple missing teeth.  There are multiple retained root segments.  There are multiple areas of periapical pathology and radiolucency.  Dental caries are noted.  There is moderate bone loss noted.  There is supra eruption and drifting of the unopposed teeth into the edentulous areas.  ASSESSMENTS: 1.  Severe aortic stenosis 2.  Preheart valve surgery dental protocol 3.  Chronic apical periodontitis 4.  Multiple dental caries 5.  Multiple retained root segments 6.  Chronic periodontitis with bone loss 7.  Gingival recession 8.  Accretions 9.  Tooth mobility 10.  Multiple missing teeth 11.  Poor occlusal scheme and malocclusion 12.  Atrophy of edentulous alveolar ridges 13.   Need for evaluation for antibiotic premedication prior to invasive dental procedures due to severe aortic stenosis and anticipated pending heart valve surgery. 14.  Risk for complications up to and including death with invasive dental procedures in the operating with general anesthesia due to his overall cardiovascular and respiratory compromise.   15.  Dental phobia   PLAN/RECOMMENDATIONS: 1. I discussed the risks, benefits, and complications of various treatment options with the patient in relationship to his medical and dental  conditions, anticipated heart valve surgery, and risk for endocarditis. We discussed various treatment options to include no treatment, multiple extractions with alveoloplasty, pre-prosthetic surgery as indicated, periodontal therapy, dental restorations, root canal therapy, crown and bridge therapy, implant therapy, and replacement of missing teeth as indicated. The patient currently wishes to proceed with extraction of remaining teeth with alveoloplasty in the operating room and general anesthesia.  The operating procedure will be scheduled upon evaluation by Dr. Roxy Manns to determine the urgency of proceeding with heart valve surgery at this time.  If heart valve surgery is deemed to be urgent, the patient will be scheduled for dental procedures in the operating with general anesthesia at [facility].  The patient will then follow-up with a dentist of his choice for fabrication of upper and lower complete dentures after adequate healing and once medically stable from the anticipated heart valve surgery.    2. Discussion of findings with medical team and coordination of future medical and dental care as needed.  I spent in excess of  120 minutes during the conduct of this consultation and >50% of this time involved direct face-to-face encounter for counseling and/or coordination of the patient's care.    Lenn Cal, DDS

## 2019-10-15 ENCOUNTER — Other Ambulatory Visit: Payer: Self-pay

## 2019-10-15 ENCOUNTER — Encounter: Payer: Self-pay | Admitting: Thoracic Surgery (Cardiothoracic Vascular Surgery)

## 2019-10-15 ENCOUNTER — Institutional Professional Consult (permissible substitution) (INDEPENDENT_AMBULATORY_CARE_PROVIDER_SITE_OTHER): Payer: Self-pay | Admitting: Thoracic Surgery (Cardiothoracic Vascular Surgery)

## 2019-10-15 VITALS — BP 121/77 | HR 83 | Temp 97.3°F | Resp 16 | Ht 70.0 in | Wt 372.0 lb

## 2019-10-15 DIAGNOSIS — I350 Nonrheumatic aortic (valve) stenosis: Secondary | ICD-10-CM

## 2019-10-15 NOTE — Patient Instructions (Addendum)
Continue all previous medications without any changes at this time  Wear a mask, practice social distancing and wash your hands frequently.  Avoid exposure with persons who may be infected with COVID-19.  Seek vaccination when possible.

## 2019-10-15 NOTE — Progress Notes (Addendum)
HE[facility] CONSULTATION REPORT  Primary Cardiologist is Kate Sable, MD PCP is Soyla Dryer, PA-C  Chief Complaint  Patient presents with  . Aortic Stenosis    TAVR eval ...review required studies/procedures...needs dental procedures before surgery    HPI:  Patient is a 44 year old morbidly obese male with history of likely bicuspid aortic valve with aortic stenosis, hypertension, type 2 diabetes mellitus, anxiety and depression who has been referred for surgical consultation to discuss treatment options for management of severe symptomatic aortic stenosis.  Patient states that he was first told that he had a heart murmur when he was hospitalized in September 2020 for cellulitis involving his left foot.  Transthoracic echocardiogram performed at that time revealed likely bicuspid aortic valve with severe aortic stenosis.  Peak velocity across aortic valve measured 4.5 m/s corresponding to mean transvalvular gradient estimated 49.5 mmHg.  No aortic insufficiency was noted and the DVI was reported 0.24.  Left ventricular systolic function appeared normal with ejection fraction estimated 55 to 60%.  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  Once he completely recovered he was referred to the multidisciplinary heart valve clinic where he has been seen previously by Dr. Burt Knack.  Catheterization revealed mild nonobstructive coronary artery disease with exception of moderate stenosis of the mid left anterior descending coronary artery.  There was severe pulmonary hypertension.  CT angiography has been performed and the patient was referred for surgical consultation.  Patient is single and lives alone near [facility].  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  He  states that he is limited physically by significant exertional shortness of breath.  He gets short of breath with moderate level activity.  He denies resting shortness of breath, PND, palpitations, dizzy spells, or syncope.  He has not experienced exertional chest discomfort.  He states that he cannot lay flat in bed and he suspects that he may have sleep apnea.  Past Medical History:  Diagnosis Date  . Anxiety   . Cholelithiasis   . Depression   . Diabetes mellitus   . Hypercholesteremia   . Hypertension   . Morbid obesity ([place])     Past Surgical History:  Procedure Laterality Date  . ABDOMINAL AORTOGRAM W/LOWER EXTREMITY N/A 07/06/2019   Procedure: ABDOMINAL AORTOGRAM W/LOWER EXTREMITY;  Surgeon: Elam Dutch, MD;  Location: [place] [facility];  Service: Cardiovascular;  Laterality: N/A;  Bilateral  . AMPUTATION Left 07/09/2019   Procedure: LEFT FOURTH and Fifth TOE AMPUTATION.;  Surgeon: Rosetta Posner, MD;  Location: [place];  Service: Vascular;  Laterality: Left;  . IR RADIOLOGY PERIPHERAL GUIDED IV START  10/05/2019  . IR US GUIDE VASC ACCESS RIGHT  10/05/2019  . RIGHT HEART CATH AND CORONARY ANGIOGRAPHY N/A 09/24/2019   Procedure: RIGHT HEART CATH AND CORONARY ANGIOGRAPHY;  Surgeon: Sherren Mocha, MD;  Location: [place] [facility];  Service: Cardiovascular;  Laterality: N/A;    Family History  Problem Relation Age of Onset  . Cancer Mother        brain  . Heart disease Father   . Hyperlipidemia Father   . Hypertension Father   . Stroke Father   . Heart murmur Sister     Social History   Socioeconomic History  . Marital status: Single    Spouse name: Not on file  .  Number of children: Not on file  . Years of education: Not on file  . Highest education level: Not on file  Occupational History  . Not on file  Tobacco Use  . Smoking status: Never Smoker  . Smokeless tobacco: Former NeurosurgeonUser    Types: Chew  Substance and Sexual Activity  . Alcohol use: Yes    Comment:  occasionally  . Drug use: No  . Sexual activity: Not on file  Other Topics Concern  . Not on file  Social History Narrative  . Not on file   Social Determinants of Health   Financial Resource Strain:   . Difficulty of Paying Living Expenses: Not on file  Food Insecurity:   . Worried About Programme researcher, broadcasting/film/videounning Out of Food in the Last Year: Not on file  . Ran Out of Food in the Last Year: Not on file  Transportation Needs:   . Lack of Transportation (Medical): Not on file  . Lack of Transportation (Non-Medical): Not on file  Physical Activity:   . Days of Exercise per Week: Not on file  . Minutes of Exercise per Session: Not on file  Stress:   . Feeling of Stress : Not on file  Social Connections:   . Frequency of Communication with Friends and Family: Not on file  . Frequency of Social Gatherings with Friends and Family: Not on file  . Attends Religious Services: Not on file  . Active Member of Clubs or Organizations: Not on file  . Attends BankerClub or Organization Meetings: Not on file  . Marital Status: Not on file  Intimate Partner Violence:   . Fear of Current or Ex-Partner: Not on file  . Emotionally Abused: Not on file  . Physically Abused: Not on file  . Sexually Abused: Not on file    Current Outpatient Medications  Medication Sig Dispense Refill  . aspirin EC 81 MG tablet Take 81 mg by mouth daily.    Marland Kitchen. atorvastatin (LIPITOR) 20 MG tablet Take 1 tablet (20 mg total) by mouth daily. 90 tablet 0  . insulin aspart (NOVOLOG FLEXPEN) 100 UNIT/ML FlexPen Inject 6-12 Units into the skin 3 (three) times daily before meals. (Patient taking differently: Inject 6-12 Units into the skin 3 (three) times daily before meals. Sliding scale) 5 pen 2  . Insulin Glargine (LANTUS SOLOSTAR) 100 UNIT/ML Solostar Pen Inject 20 Units into the skin at bedtime. (Patient taking differently: Inject 30 Units into the skin at bedtime. ) 5 pen 2  . lisinopril (ZESTRIL) 20 MG tablet Take 1 tablet (20 mg total) by  mouth daily. 90 tablet 0  . metoprolol tartrate (LOPRESSOR) 50 MG tablet Take 1 tablet (50 mg total) by mouth 2 (two) times daily. 180 tablet 0  . sitaGLIPtin-metformin (JANUMET) 50-500 MG tablet Take 1 tablet by mouth 2 (two) times daily with a meal. 180 tablet 0   No current facility-administered medications for this visit.    Allergies  Allergen Reactions  . Pollen Extract Other (See Comments)    Runny nose, watery eyes, sneezing      Review of Systems:   General:  normal appetite, decreased energy, no weight gain, no weight loss, no fever  Cardiac:  no chest pain with exertion, no chest pain at rest, +SOB with exertion, no resting SOB, no PND, + orthopnea, no palpitations, no arrhythmia, no atrial fibrillation, no LE edema, no dizzy spells, no syncope  Respiratory:  + shortness of breath, no home oxygen, no productive cough, no  dry cough, no bronchitis, no wheezing, no hemoptysis, no asthma, no pain with inspiration or cough, + suspected sleep apnea but never tested, no CPAP at night  GI:   no difficulty swallowing, no reflux, no frequent heartburn, no hiatal hernia, no abdominal pain, no constipation, no diarrhea, no hematochezia, no hematemesis, no melena  GU:   no dysuria,  no frequency, no urinary tract infection, no hematuria, no enlarged prostate, no kidney stones, no kidney disease  Vascular:  no pain suggestive of claudication, no pain in feet, no leg cramps, no varicose veins, no DVT, no non-healing foot ulcer  Neuro:   no stroke, no TIA's, no seizures, no headaches, no temporary blindness one eye,  no slurred speech, no peripheral neuropathy, no chronic pain, no instability of gait, no memory/cognitive dysfunction  Musculoskeletal: no arthritis, no joint swelling, no myalgias, no difficulty walking, normal mobility   Skin:   no rash, no itching, no skin infections, no pressure sores or ulcerations  Psych:   + anxiety, + depression, + nervousness, + unusual recent  stress  Eyes:   no blurry vision, no floaters, no recent vision changes, + wears glasses or contacts  ENT:   no hearing loss, no loose or painful teeth, no dentures, last saw dentist recently - needs dental extraction  Hematologic:  no easy bruising, no abnormal bleeding, no clotting disorder, no frequent epistaxis  Endocrine:  + diabetes, does  check CBG's at home           Physical Exam:   BP 121/77 (BP Location: Left Arm, Patient Position: Sitting, Cuff Size: Large)   Pulse 83   Temp (!) 97.3 F (36.3 C)   Resp 16   Ht 5\' 10"  (1.778 m)   Wt (!) 372 lb (168.7 kg)   SpO2 98% Comment: RA  BMI 53.38 kg/m   General:  Morbidly obese male NAD   HEENT:  Unremarkable   Neck:   no JVD, no bruits, no adenopathy   Chest:   clear to auscultation, symmetrical breath sounds, no wheezes, no rhonchi   CV:   RRR, grade III/VI crescendo/decrescendo murmur heard best at RSB,  no diastolic murmur  Abdomen:  soft, non-tender, no masses   Extremities:  warm, well-perfused, pulses diminished, no LE edema  Rectal/GU  Deferred  Neuro:   Grossly non-focal and symmetrical throughout  Skin:   Clean and dry, no rashes, no breakdown   Diagnostic Tests:    ECHOCARDIOGRAM REPORT       Patient Name:   Adam Long Date of Exam: 06/12/2019 Medical Rec #:  [id number]       Height:       71.0 in Accession #:    [id number]      Weight:       377.9 lb Date of Birth:  [birth year]       BSA:          2.76 m Patient Age:    43 years        BP:           123/69 mmHg Patient Gender: M               HR:           91 bpm. [facility]    Procedure: 2D Echo, Cardiac Doppler and Color Doppler  Indications:    Syncope 780.2 / R55   History:        Patient has no prior  history of Echocardiogram examinations.                 Risk Factors: Hypertension, Diabetes and Dyslipidemia. Morbid                 obesity.   Sonographer:    Celesta Gentile RCS Referring Phys: [phone] COU[place]  [place]  IMPRESSIONS    1. The left ventricle has normal systolic function, with an ejection fraction of 55-60%. The cavity size was normal. There is mildly increased left ventricular wall thickness. Left ventricular diastolic Doppler parameters are consistent with  pseudonormalization. Elevated mean left atrial pressure.  2. The right ventricle has normal systolic function. The cavity was normal. There is no increase in right ventricular wall thickness.  3. The aortic valve has an indeterminate number of cusps. Severely thickening of the aortic valve. Severe calcifcation of the aortic valve. Severe stenosis of the aortic valve. Severe aortic annular calcification noted.  4. The mitral valve is abnormal. Moderate thickening of the mitral valve leaflet. Moderate calcification of the mitral valve leaflet. There is moderate mitral annular calcification present. No evidence of mitral valve stenosis.  5. The aorta is normal unless otherwise noted.  6. The aortic root is normal in size and structure.  7. Pulmonary hypertension is indeterminant, inadequate TR jet.  8. The interatrial septum was not well visualized.  FINDINGS  Left Ventricle: The left ventricle has normal systolic function, with an ejection fraction of 55-60%. The cavity size was normal. There is mildly increased left ventricular wall thickness. Left ventricular diastolic Doppler parameters are consistent  with pseudonormalization. Elevated mean left atrial pressure Definity contrast agent was given IV to delineate the left ventricular endocardial borders.  Right Ventricle: The right ventricle has normal systolic function. The cavity was normal. There is no increase in right ventricular wall thickness.  Left Atrium: Left atrial size was normal in size.  Right Atrium: Right atrial size was normal in size. Right atrial pressure is estimated at 10 mmHg.  Interatrial Septum: The interatrial septum was not well  visualized.  Pericardium: There is no evidence of pericardial effusion.  Mitral Valve: The mitral valve is abnormal. Moderate thickening of the mitral valve leaflet. Moderate calcification of the mitral valve leaflet. There is moderate mitral annular calcification present. Mitral valve regurgitation is not visualized by color  flow Doppler. No evidence of mitral valve stenosis.  Tricuspid Valve: The tricuspid valve is normal in structure. Tricuspid valve regurgitation was not visualized by color flow Doppler.  Aortic Valve: The aortic valve has an indeterminate number of cusps Severely thickening of the aortic valve. Severe calcifcation of the aortic valve. Aortic valve regurgitation was not visualized by color flow Doppler. There is Severe stenosis of the  aortic valve, with a calculated valve area of 1.01 cm. Severe aortic annular calcification noted.  Pulmonic Valve: The pulmonic valve was not well visualized. Pulmonic valve regurgitation is not visualized by color flow Doppler. No evidence of pulmonic stenosis.  Aorta: The aortic root is normal in size and structure. The aorta is normal unless otherwise noted.  Pulmonary Artery: Pulmonary hypertension is indeterminant, inadequate TR jet.  Venous: The inferior vena cava was not well visualized.    +--------------+--------++ LEFT VENTRICLE         +----------------+---------++ +--------------+--------++ Diastology                PLAX 2D                +----------------+---------++ +--------------+--------++  LV e' lateral:  8.38 cm/s LVIDd:        5.58 cm  +----------------+---------++ +--------------+--------++ LV E/e' lateral:15.5      LVIDs:        3.92 cm  +----------------+---------++ +--------------+--------++ LV e' medial:   7.07 cm/s LV PW:        1.16 cm  +----------------+---------++ +--------------+--------++ LV E/e' medial: 18.4      LV IVS:       1.18 cm   +----------------+---------++ +--------------+--------++ LVOT diam:    2.30 cm  +--------------+--------++ LV SV:        86 ml    +--------------+--------++ LV SV Index:  28.33    +--------------+--------++ LVOT Area:    4.15 cm +--------------+--------++                        +--------------+--------++  +---------------+----------++ RIGHT VENTRICLE           +---------------+----------++ RV S prime:    11.70 cm/s +---------------+----------++ TAPSE (M-mode):2.2 cm     +---------------+----------++  +---------------+-------++-----------++ LEFT ATRIUM           Index       +---------------+-------++-----------++ LA diam:       4.40 cm1.59 cm/m  +---------------+-------++-----------++ LA Vol (A2C):  72.2 ml26.13 ml/m +---------------+-------++-----------++ LA Vol (A4C):  51.5 ml18.64 ml/m +---------------+-------++-----------++ LA Biplane Vol:62.2 ml22.51 ml/m +---------------+-------++-----------++ +------------+---------++-----------++ RIGHT ATRIUM         Index       +------------+---------++-----------++ RA Area:    15.60 cm            +------------+---------++-----------++ RA Volume:  39.00 ml 14.11 ml/m +------------+---------++-----------++  +------------------+------------++ AORTIC VALVE                   +------------------+------------++ AV Area (Vmax):   0.92 cm     +------------------+------------++ AV Area (Vmean):  0.80 cm     +------------------+------------++ AV Area (VTI):    1.01 cm     +------------------+------------++ AV Vmax:          451.50 cm/s  +------------------+------------++ AV Vmean:         323.500 cm/s +------------------+------------++ AV VTI:           1.017 m      +------------------+------------++ AV Peak Grad:     81.5 mmHg    +------------------+------------++ AV Mean Grad:     49.5 mmHg     +------------------+------------++ LVOT Vmax:        100.00 cm/s  +------------------+------------++ LVOT Vmean:       62.500 cm/s  +------------------+------------++ LVOT VTI:         0.248 m      +------------------+------------++ LVOT/AV VTI ratio:0.24         +------------------+------------++   +-------------+-------++ AORTA                +-------------+-------++ Ao Root diam:3.20 cm +-------------+-------++  +--------------+----------++ MITRAL VALVE              +--------------+-------+ +--------------+----------++  SHUNTS                MV Area (PHT):4.80 cm    +--------------+-------+ +--------------+----------++  Systemic VTI: 0.25 m  MV PHT:       45.82 msec  +--------------+-------+ +--------------+----------++  Systemic Diam:2.30 cm MV Decel Time:158 msec    +--------------+-------+ +--------------+----------++ +--------------+-----------++ MV E velocity:130.00 cm/s +--------------+-----------++ MV A velocity:93.10 cm/s  +--------------+-----------++ MV E/A ratio: 1.40        +--------------+-----------++  Dina RichJonathan Branch MD Electronically signed by Dina RichJonathan Branch MD Signature Date/Time: 06/12/2019/4:46:33 PM     RIGHT HEART CATH AND CORONARY ANGIOGRAPHY  Conclusion  1.  Calcified coronary arteries with moderate mid LAD stenosis, otherwise mild nonobstructive disease 2.  Known severe aortic stenosis by echo assessment with heavy calcification and restriction of the aortic valve leaflets as assessed by plain fluoroscopy 3.  Severe pulmonary hypertension with mean PA pressure 51 mmHg, at least in part to left heart disease with elevated wedge pressure of 31 mmHg  Recommendations: Ongoing multidisciplinary heart team evaluation for treatment of severe symptomatic aortic stenosis.  Medical therapy for nonobstructive CAD.  Indications  Severe aortic stenosis [I35.0 (ICD-10-CM)]   Procedural Details  Technical Details INDICATION: Severe aortic stenosis, diastolic heart failure, preoperative study.  PROCEDURAL DETAILS: There was an indwelling IV in a right antecubital vein. Using normal sterile technique, the IV was changed out for a 5 Fr brachial sheath over a 0.018 inch wire. The right wrist was then prepped, draped, and anesthetized with 1% lidocaine. Using the modified Seldinger technique a 5/6 French Slender sheath was placed in the right radial artery. Intra-arterial verapamil was administered through the radial artery sheath. IV heparin was administered after a JR4 catheter was advanced into the central aorta. A Swan-Ganz catheter was used for the right heart catheterization. Standard protocol was followed for recording of right heart pressures and sampling of oxygen saturations. Fick cardiac output was calculated. Standard Judkins catheters were used for selective coronary angiography.  There is known severe aortic stenosis.  No attempt is made to cross the aortic valve.  There were no immediate procedural complications. The patient was transferred to the post catheterization recovery area for further monitoring.    Estimated blood loss <50 mL.   During this procedure medications were administered to achieve and maintain moderate conscious sedation while the patient's heart rate, blood pressure, and oxygen saturation were continuously monitored and I was present face-to-face 100% of this time.  Medications (Filter: Administrations occurring from 09/24/19 0717 to 09/24/19 0819) midazolam (VERSED) injection (mg) Total dose:  2 mg Date/Time  Rate/Dose/Volume Action  09/24/19 0736  2 mg Given    fentaNYL (SUBLIMAZE) injection (mcg) Total dose:  25 mcg Date/Time  Rate/Dose/Volume Action  09/24/19 0736  25 mcg Given    lidocaine (PF) (XYLOCAINE) 1 % injection (mL) Total volume:  6 mL Date/Time  Rate/Dose/Volume Action  09/24/19 0744  3 mL Given  0754  3 mL Given     Heparin (Porcine) in NaCl 1000-0.9 UT/500ML-% SOLN (mL) Total volume:  500 mL Date/Time  Rate/Dose/Volume Action  09/24/19 0745  500 mL Given    Radial Cocktail/Verapamil only (mL) Total volume:  10 mL Date/Time  Rate/Dose/Volume Action  09/24/19 0754  10 mL Given    heparin injection (Units) Total dose:  7,000 Units Date/Time  Rate/Dose/Volume Action  09/24/19 0757  7,000 Units Given    iohexol (OMNIPAQUE) 350 MG/ML injection (mL) Total volume:  70 mL Date/Time  Rate/Dose/Volume Action  09/24/19 0815  70 mL Given    Sedation Time  Sedation Time Physician-1: 33 minutes 6 seconds  Contrast  Medication Name Total Dose  iohexol (OMNIPAQUE) 350 MG/ML injection 70 mL    Radiation/Fluoro  Fluoro time: 6.4 (min) DAP: 39364 (mGycm2) Cumulative Air Kerma: 623 (mGy)  Coronary Findings  Diagnostic Dominance: Right Left Main  The vessel is mildly calcified. Patent without stenosis. Divides into the LAD and left circumflex.  Left Anterior Descending  There  is mild diffuse disease throughout the vessel. The vessel is severely calcified. The LAD has diffuse calcification present. There is no high-grade stenosis. The proximal LAD is heavily calcified but widely patent. The mid LAD after the first diagonal has 40 to 50% stenosis. The distal LAD has diffuse nonobstructive disease with moderate calcification.  Prox LAD to Mid LAD lesion 50% stenosed  Prox LAD to Mid LAD lesion is 50% stenosed. The lesion is moderately calcified. Mild to moderate mid LAD stenosis after the first diagonal branch, estimated at 40 to 50% with moderate calcification.  Left Circumflex  Vessel is small. The vessel exhibits minimal luminal irregularities. The circumflex distribution is relatively small. The first OM branch is small in caliber. There is no significant stenosis throughout the circumflex distribution.  Right Coronary Artery  Vessel is large. There is mild diffuse disease throughout the vessel.  The vessel is moderately calcified. The RCA is a large, dominant vessel. The vessel is diffusely calcified. Distally, it divides into the PDA and posterior AV segment which supplies a single PLA branch. There are no significant stenoses throughout the RCA or its branch vessels.  Intervention  No interventions have been documented. Right Heart  Right Heart Pressures Hemodynamic findings consistent with severe pulmonary hypertension. Elevated LV EDP consistent with volume overload.  Coronary Diagrams  Diagnostic Dominance: Right  Intervention  Implants   No implant documentation for this case.  Syngo Images  Show images for CARDIAC CATHETERIZATION  Images on Long Term Storage  Show images for Donie, Lemelin "[name]"   Link to Procedure Log  Procedure Log    Hemo Data   Most Recent Value  Fick Cardiac Output 5.98 L/min  Fick Cardiac Output Index 2.18 (L/min)/BSA  RA A Wave 15 mmHg  RA V Wave 13 mmHg  RA Mean 10 mmHg  RV Systolic Pressure 67 mmHg  RV Diastolic Pressure 8 mmHg  RV EDP 13 mmHg  PA Systolic Pressure 71 mmHg  PA Diastolic Pressure 33 mmHg  PA Mean 51 mmHg  PW A Wave 36 mmHg  PW V Wave 33 mmHg  PW Mean 31 mmHg  AO Systolic Pressure 123 mmHg  AO Diastolic Pressure 85 mmHg  AO Mean 100 mmHg  QP/QS 1.28  TPVR Index 23.36 HRUI  TSVR Index 45.81 HRUI  PVR SVR Ratio 0.22  TPVR/TSVR Ratio 0.51     Cardiac TAVR CT  TECHNIQUE: The patient was scanned on a [employer]. A 120 kV retrospective scan was triggered in the descending thoracic aorta at 111 HU's. Gantry rotation speed was 250 msecs and collimation was .6 mm. No beta blockade or nitro were given. The 3D data set was reconstructed in 5% intervals of the R-R cycle. Systolic and diastolic phases were analyzed on a dedicated work station using MPR, MIP and VRT modes. The patient received 80 cc of contrast.  FINDINGS: Image quality: Average.  Noise artifact is: Moderate  signal-to-noise due to obesity (BMI 55).  Valve Morphology: Bicuspid, Sievers type 0 (non-raphe). The leaflets are severely calcified with restricted movement.  Aortic valve calcium score: Calcium score protocol not performed.  Aortic annular dimension: Difficult annular assessment due to signal to noise artifact. Large annular size noted.  Phase assessed: 35%  Annular area: 6.51 cm2  Annular perimeter: 92.2 mm  Max diameter: 31.8 mm  Min diameter: 27.1 mm  Annular and su-bannular calcification: No significant annular calcification. Mild calcification of the sub-annular area in the aorta-mitral valve continuity.  Optimal coplanar projection:  LAO 6 CRA 11  Coronary Artery Height above Annulus:  Left Main: 13.5 mm  Right Coronary: 18.6 mm  Sinus of Valsalva: Cusp to Cusp (bicuspid), 40 mm  Sinotubular Junction: 29.4 mm  STJ Height: 20.2 mm  Ascending Thoracic Aorta: 44 mm  Coronary arteries: Normal origins. Right dominance. Severe 3-vessel calcifications noted. Study performed without NTG and insufficient for evaluation.  Cardiac Morphology:  Right Atrium: Right atrial size is within normal limits.  Right Ventricle: The right ventricular cavity is within normal limits.  Left Atrium: Left atrial size is normal in size with no left atrial appendage filling defect.  Left Ventricle: The ventricular cavity size is within normal limits. There are no stigmata of prior infarction. There is no abnormal filling defect. Normal left ventricular function, EF 64%. No wall motion abnormalities.  Pulmonary arteries: Normal in size without proximal filling defect.  Pulmonary veins: Normal pulmonary venous drainage. There were 4 noted pulmonary veins, 2 on the right and 2 on the left.  Pericardium: Normal thickness with no significant effusion or calcium present.  Mitral Valve: The mitral valve is normal structure with mild mitral annular  calcification.  Extra-cardiac findings: See attached radiology report for non-cardiac structures.  IMPRESSION: 1. Average quality study due to moderate signal- to-noise artifact due to severe obesity (BMI 55).  2. Bicuspid valve, Sievers type 0 (non-raphe). Severely calcified with restricted leaflet motion.  3. Annular area 6.51 cm2. Appropriate for 29 mm Edwards Sapien 3 TAVR.  4. Sufficient coronary to annulus distance.  5. Optimum Fluoroscopic Angle for Delivery: LAO 6 CRA 11  6. No thrombus in the left atrial appendage.  7. 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  8. Severe 3-vessel calcifications noted.  Lennie Odor, MD   Electronically Signed   By: Lennie Odor   On: 10/05/2019 18:13    CT ANGIOGRAPHY CHEST, ABDOMEN AND PELVIS  TECHNIQUE: Multidetector CT imaging through the chest, abdomen and pelvis was performed using the standard protocol during bolus administration of intravenous contrast. Multiplanar reconstructed images and MIPs were obtained and reviewed to evaluate the vascular anatomy.  CONTRAST:  OMNIPAQUE IOHEXOL 350 MG/ML SOLN  COMPARISON:  CT the abdomen and pelvis 01/20/2007.  FINDINGS: CTA CHEST FINDINGS  Cardiovascular: Heart size is normal. There is no significant pericardial fluid, thickening or pericardial calcification. There is aortic atherosclerosis, as well as atherosclerosis of the great vessels of the mediastinum and the coronary arteries, including calcified atherosclerotic plaque in the left main, left anterior descending, left circumflex and right coronary arteries. Severe thickening calcification of the aortic valve. Ectasia of ascending thoracic aorta (4.3 cm in diameter).  Mediastinum/Lymph Nodes: No pathologically enlarged mediastinal or hilar lymph nodes. Esophagus is unremarkable in appearance. No axillary lymphadenopathy.  Lungs/Pleura: No suspicious appearing pulmonary nodules or  masses are noted. No acute consolidative airspace disease. No pleural effusions.  Musculoskeletal/Soft Tissues: There are no aggressive appearing lytic or blastic lesions noted in the visualized portions of the skeleton.  CTA ABDOMEN AND PELVIS FINDINGS  Hepatobiliary: Diffuse low attenuation throughout the hepatic parenchyma, indicative of hepatic steatosis. No suspicious cystic or solid hepatic lesions. No intra or extrahepatic biliary ductal dilatation. Small calcified granuloma in the left lobe of the liver incidentally noted. Partially calcified gallstones are noted within the gallbladder. No surrounding inflammatory changes to suggest an acute cholecystitis at this time.  Pancreas: No pancreatic mass. No pancreatic ductal dilatation. No pancreatic or peripancreatic fluid collections or inflammatory changes.  Spleen: Unremarkable.  Adrenals/Urinary Tract:  Bilateral kidneys and adrenal glands are normal in appearance. No hydroureteronephrosis. Urinary bladder is normal in appearance.  Stomach/Bowel: Normal appearance of the stomach. No pathologic dilatation of small bowel or colon. The appendix is not confidently identified and may be surgically absent. Regardless, there are no inflammatory changes noted adjacent to the cecum to suggest the presence of an acute appendicitis at this time.  Vascular/Lymphatic: Aortic atherosclerosis, without evidence of aneurysm or dissection in the abdominal or pelvic vasculature. Vascular findings and measurements pertinent to potential TAVR procedure, as detailed below. No lymphadenopathy noted in the abdomen or pelvis.  Reproductive: Prostate gland and seminal vesicles are unremarkable in appearance.  Other: No significant volume of ascites.  No pneumoperitoneum.  Musculoskeletal: There are no aggressive appearing lytic or blastic lesions noted in the visualized portions of the skeleton.  VASCULAR MEASUREMENTS  PERTINENT TO TAVR:  AORTA:  Minimal Aortic Diameter-20 x 18 mm  Severity of Aortic Calcification-mild  RIGHT PELVIS:  Right Common Iliac Artery -  Minimal Diameter-12.8 x 9.7 mm  Tortuosity - mild  Calcification - mild  Right External Iliac Artery -  Minimal Diameter-10.1 x 10.0 mm  Tortuosity - mild  Calcification-none  Right Common Femoral Artery -  Minimal Diameter-9.4 x 9.2 mm  Tortuosity - mild  Calcification-none  LEFT PELVIS:  Left Common Iliac Artery -  Minimal Diameter-13.0 x 11.9 mm  Tortuosity - mild  Calcification - mild  Left External Iliac Artery -  Minimal Diameter-10.1 x 9.8 mm  Tortuosity - mild  Calcification-none  Left Common Femoral Artery -  Minimal Diameter-9.7 x 10.0 mm  Tortuosity-mild  Calcification-none  Review of the MIP images confirms the above findings.  IMPRESSION: 1. Vascular findings and measurements pertinent to potential TAVR procedure, as detailed above. 2. Severe thickening and calcification of the aortic valve, compatible with reported clinical history of severe aortic stenosis. 3. Aortic atherosclerosis, in addition to left main and 3 vessel coronary artery disease. Please note that although the presence of coronary artery calcium documents the presence of coronary artery disease, the severity of this disease and any potential stenosis cannot be assessed on this non-gated CT examination. Assessment for potential risk factor modification, dietary therapy or pharmacologic therapy may be warranted, if clinically indicated. 4. There is also ectasia of the ascending thoracic aorta (4.3 cm in diameter). 5. Hepatic steatosis. 6. Cholelithiasis without evidence of acute cholecystitis at this time.   Electronically Signed   By: Trudie Reed M.D.   On: 10/05/2019 11:48   EKG: NSR w/out significant AV conduction delay    Impression:  Patient has likely bicuspid  aortic valve with stage D severe symptomatic aortic stenosis.  He describes stable symptoms of exertional shortness of breath consistent with chronic diastolic congestive heart failure, New York Heart Association functional class IIb.  I have personally reviewed the patient's most recent transthoracic echocardiogram, diagnostic cardiac catheterization, twelve-lead EKG, and CT angiograms.  Transthoracic echocardiogram demonstrates severe aortic stenosis with preserved left ventricular systolic function.  Acoustic windows were relatively poor quality but the entire aortic valve is notable for heavily calcified and thickened leaflets with severely restricted leaflet mobility.  Peak velocity across aortic valve measured 4.5 m/s corresponding to mean transvalvular gradient estimated close to 50 mmHg.  Left ventricular systolic function remains normal.  Diagnostic cardiac catheterization reveals nonobstructive coronary artery disease with moderate stenosis in the mid left anterior descending coronary artery that does not appear to be flow-limiting.  There are no other areas of significant plaque  identified.  Transvalvular gradient was not assessed but the patient was noted to have severe pulmonary hypertension.  Cardiac gated CT angiogram of the heart was relatively poor quality and limited because of patient's body habitus.  Precise quantification of the size of the aortic annulus is subsequently challenging, but overall the annulus size is relatively large.  The patient appears to have a Sievers type 0 bicuspid aortic valve and there is heavy calcification involving the leaflets that is somewhat asymmetrical.  There is mild ectasia of the ascending aorta measuring 4.3 cm in its greatest transverse diameter.  EKG reveals sinus rhythm with no significant AV conduction delay.  I agree the patient needs aortic valve replacement.  Risks associated with conventional surgery will be somewhat elevated because of the patient's  severe morbid obesity.  However, I would be reluctant to consider transcatheter aortic valve replacement as an alternative to conventional surgery because of the patient's remarkably young age and the presence of a bicuspid aortic valve with severe but asymmetric valvular calcification.  We discussed the timing of patient's planned surgical intervention with reference to the ongoing COVID-19 pandemic.  Whereas the patient's surgery is by no means urgent and could be delayed 1-2 weeks without significant risk, delay of surgery by more than 3 months would likely be associated with considerable clinical risk.    Plan:  The patient was counseled at length regarding treatment alternatives for management of severe aortic stenosis including continued medical therapy versus proceeding with aortic valve replacement in the near future.  The natural history of aortic stenosis was reviewed, as was long term prognosis with medical therapy alone.  Surgical options were discussed at length including conventional surgical aortic valve replacement through either a full median sternotomy or using minimally invasive techniques.  Transcatheter aortic valve replacement was discussed as a much less invasive alternative to conventional surgery.  Discussion was held comparing the relative risks of mechanical valve replacement with need for lifelong anticoagulation versus use of a bioprosthetic tissue valve and the associated potential for late structural valve deterioration and failure.    The patient is interested in proceeding with definitive surgical treatment in the near future, although he wants to think about the various options further.  As a next step the patient needs to undergo dental extraction.  He will return to our office within the next 2 to 3 weeks once dental extraction has been completed to make final plans for surgery.  All questions answered.   I spent in excess of 90 minutes during the conduct of this  office consultation and >50% of this time involved direct face-to-face encounter with the patient for counseling and/or coordination of their care.     Salvatore Decent. Cornelius Moras, MD 10/15/2019 1:14 PM

## 2019-10-16 NOTE — Addendum Note (Signed)
Addended by: Cindra Eves F on: 10/16/2019 01:40 PM   Modules accepted: Orders, SmartSet

## 2019-10-22 ENCOUNTER — Other Ambulatory Visit (HOSPITAL_COMMUNITY)
Admission: RE | Admit: 2019-10-22 | Discharge: 2019-10-22 | Disposition: A | Discharge status: Home / Self Care | Payer: Self-pay | Source: Ambulatory Visit | Attending: Specialist | Admitting: Specialist

## 2019-10-22 ENCOUNTER — Other Ambulatory Visit: Payer: Self-pay

## 2019-10-22 DIAGNOSIS — E785 Hyperlipidemia, unspecified: Secondary | ICD-10-CM | POA: Insufficient documentation

## 2019-10-22 DIAGNOSIS — E118 Type 2 diabetes mellitus with unspecified complications: Secondary | ICD-10-CM | POA: Insufficient documentation

## 2019-10-22 DIAGNOSIS — I10 Essential (primary) hypertension: Secondary | ICD-10-CM | POA: Insufficient documentation

## 2019-10-22 DIAGNOSIS — E1165 Type 2 diabetes mellitus with hyperglycemia: Secondary | ICD-10-CM | POA: Insufficient documentation

## 2019-10-22 DIAGNOSIS — IMO0002 Reserved for concepts with insufficient information to code with codable children: Secondary | ICD-10-CM

## 2019-10-22 LAB — COMPREHENSIVE METABOLIC PANEL
ALT: 23 U/L (ref 0–44)
AST: 18 U/L (ref 15–41)
Albumin: 3.9 g/dL (ref 3.5–5.0)
Alkaline Phosphatase: 45 U/L (ref 38–126)
Anion gap: 11 (ref 5–15)
BUN: 16 mg/dL (ref 6–20)
CO2: 26 mmol/L (ref 22–32)
Calcium: 9.3 mg/dL (ref 8.9–10.3)
Chloride: 96 mmol/L — ABNORMAL LOW (ref 98–111)
Creatinine, Ser: 0.79 mg/dL (ref 0.61–1.24)
GFR calc Af Amer: >60 mL/min (ref >60)
GFR calc non Af Amer: >60 mL/min (ref >60)
Glucose, Bld: 162 mg/dL — ABNORMAL HIGH (ref 70–99)
Potassium: 4.6 mmol/L (ref 3.5–5.1)
Sodium: 133 mmol/L — ABNORMAL LOW (ref 135–145)
Total Bilirubin: 1.2 mg/dL (ref 0.3–1.2)
Total Protein: 7.5 g/dL (ref 6.5–8.1)

## 2019-10-22 LAB — LIPID PANEL
Cholesterol: 160 mg/dL (ref 0–200)
HDL: 38 mg/dL — ABNORMAL LOW (ref >40)
LDL Cholesterol: 98 mg/dL (ref 0–99)
Total CHOL/HDL Ratio: 4.2 RATIO
Triglycerides: 119 mg/dL (ref <150)
VLDL: 24 mg/dL (ref 0–40)

## 2019-10-23 ENCOUNTER — Ambulatory Visit (INDEPENDENT_AMBULATORY_CARE_PROVIDER_SITE_OTHER): Payer: Self-pay | Admitting: Cardiovascular Disease

## 2019-10-23 ENCOUNTER — Encounter: Payer: Self-pay | Admitting: Cardiovascular Disease

## 2019-10-23 ENCOUNTER — Ambulatory Visit: Payer: Self-pay | Admitting: Physician Assistant

## 2019-10-23 ENCOUNTER — Other Ambulatory Visit: Payer: Self-pay

## 2019-10-23 ENCOUNTER — Encounter: Payer: Self-pay | Admitting: Physician Assistant

## 2019-10-23 VITALS — BP 116/70 | HR 76 | Temp 96.8°F | Ht 70.0 in | Wt 388.0 lb

## 2019-10-23 DIAGNOSIS — I350 Nonrheumatic aortic (valve) stenosis: Secondary | ICD-10-CM

## 2019-10-23 DIAGNOSIS — I10 Essential (primary) hypertension: Secondary | ICD-10-CM

## 2019-10-23 DIAGNOSIS — I2583 Coronary atherosclerosis due to lipid rich plaque: Secondary | ICD-10-CM

## 2019-10-23 DIAGNOSIS — E782 Mixed hyperlipidemia: Secondary | ICD-10-CM

## 2019-10-23 DIAGNOSIS — IMO0002 Reserved for concepts with insufficient information to code with codable children: Secondary | ICD-10-CM

## 2019-10-23 DIAGNOSIS — E785 Hyperlipidemia, unspecified: Secondary | ICD-10-CM

## 2019-10-23 DIAGNOSIS — E1165 Type 2 diabetes mellitus with hyperglycemia: Secondary | ICD-10-CM

## 2019-10-23 DIAGNOSIS — I2510 Atherosclerotic heart disease of native coronary artery without angina pectoris: Secondary | ICD-10-CM

## 2019-10-23 MED ORDER — LANTUS SOLOSTAR 100 UNIT/ML ~~LOC~~ SOPN: 30.0000 [IU] | PEN_INJECTOR | Freq: Every day | SUBCUTANEOUS | Status: DC

## 2019-10-23 MED ORDER — ATORVASTATIN CALCIUM 40 MG PO TABS: 40.0000 mg | ORAL_TABLET | Freq: Every day | ORAL | 1 refills | Status: DC

## 2019-10-23 MED ORDER — NOVOLOG FLEXPEN 100 UNIT/ML ~~LOC~~ SOPN: 6.0000 [IU] | PEN_INJECTOR | Freq: Three times a day (TID) | SUBCUTANEOUS | Status: DC

## 2019-10-23 NOTE — Progress Notes (Signed)
SUBJECTIVE: The patient presents routine follow-up.  He is in the process of being evaluated for aortic valve replacement in the structural heart clinic with Dr. Burt Knack and recently saw Dr. Roxy Manns.  Cardiac catheterization performed on 09/16/2019 demonstrated calcified coronary arteries with moderate mid LAD stenosis but otherwise mild nonobstructive disease.  There was severe pulmonary hypertension.  He denies chest pain or palpitations.  Chronic exertional dyspnea is stable.  He is very nervous about undergoing surgical aortic valve replacement.    Review of Systems: As per "subjective", otherwise negative.  Allergies  Allergen Reactions  . Pollen Extract Other (See Comments)    Runny nose, watery eyes, sneezing    Current Outpatient Medications  Medication Sig Dispense Refill  . aspirin EC 81 MG tablet Take 81 mg by mouth daily.    Marland Kitchen atorvastatin (LIPITOR) 40 MG tablet Take 1 tablet (40 mg total) by mouth daily. 90 tablet 1  . insulin aspart (NOVOLOG FLEXPEN) 100 UNIT/ML FlexPen Inject 6-12 Units into the skin 3 (three) times daily before meals. Sliding scale    . Insulin Glargine (LANTUS SOLOSTAR) 100 UNIT/ML Solostar Pen Inject 30 Units into the skin at bedtime.    Marland Kitchen lisinopril (ZESTRIL) 20 MG tablet Take 1 tablet (20 mg total) by mouth daily. 90 tablet 0  . metoprolol tartrate (LOPRESSOR) 50 MG tablet Take 1 tablet (50 mg total) by mouth 2 (two) times daily. 180 tablet 0  . sitaGLIPtin-metformin (JANUMET) 50-500 MG tablet Take 1 tablet by mouth 2 (two) times daily with a meal. 180 tablet 0   No current facility-administered medications for this visit.    Past Medical History:  Diagnosis Date  . Anxiety   . Cholelithiasis   . Depression   . Diabetes mellitus   . Hypercholesteremia   . Hypertension   . Morbid obesity ([place])     Past Surgical History:  Procedure Laterality Date  . ABDOMINAL AORTOGRAM W/LOWER EXTREMITY N/A 07/06/2019   Procedure: ABDOMINAL AORTOGRAM  W/LOWER EXTREMITY;  Surgeon: Elam Dutch, MD;  Location: [facility];  Service: Cardiovascular;  Laterality: N/A;  Bilateral  . AMPUTATION Left 07/09/2019   Procedure: LEFT FOURTH and Fifth TOE AMPUTATION.;  Surgeon: Rosetta Posner, MD;  Location: [facility];  Service: Vascular;  Laterality: Left;  . IR RADIOLOGY PERIPHERAL GUIDED IV START  10/05/2019  . IR US GUIDE VASC ACCESS RIGHT  10/05/2019  . RIGHT HEART CATH AND CORONARY ANGIOGRAPHY N/A 09/24/2019   Procedure: RIGHT HEART CATH AND CORONARY ANGIOGRAPHY;  Surgeon: Sherren Mocha, MD;  Location: [facility];  Service: Cardiovascular;  Laterality: N/A;    Social History   Socioeconomic History  . Marital status: Single    Spouse name: Not on file  . Number of children: Not on file  . Years of education: Not on file  . Highest education level: Not on file  Occupational History  . Not on file  Tobacco Use  . Smoking status: Never Smoker  . Smokeless tobacco: Former Systems developer    Types: Chew  Substance and Sexual Activity  . Alcohol use: Not Currently    Comment: occasionally  . Drug use: No  . Sexual activity: Not on file  Other Topics Concern  . Not on file  Social History Narrative  . Not on file   Social Determinants of Health   Financial Resource Strain:   . Difficulty of Paying Living Expenses: Not on file  Food Insecurity:   .  Worried About Programme researcher, broadcasting/film/video in the Last Year: Not on file  . Ran Out of Food in the Last Year: Not on file  Transportation Needs:   . Lack of Transportation (Medical): Not on file  . Lack of Transportation (Non-Medical): Not on file  Physical Activity:   . Days of Exercise per Week: Not on file  . Minutes of Exercise per Session: Not on file  Stress:   . Feeling of Stress : Not on file  Social Connections:   . Frequency of Communication with Friends and Family: Not on file  . Frequency of Social Gatherings with Friends and Family: Not on file  . Attends Religious Services:  Not on file  . Active Member of Clubs or Organizations: Not on file  . Attends Banker Meetings: Not on file  . Marital Status: Not on file  Intimate Partner Violence:   . Fear of Current or Ex-Partner: Not on file  . Emotionally Abused: Not on file  . Physically Abused: Not on file  . Sexually Abused: Not on file     Vitals:   10/23/19 1044  BP: 116/70  Pulse: 76  Temp: (!) 96.8 F (36 C)  SpO2: 97%  Weight: (!) 388 lb (176 kg)  Height: 5\' 10"  (1.778 m)    Wt Readings from Last 3 Encounters:  10/23/19 (!) 388 lb (176 kg)  10/15/19 (!) 372 lb (168.7 kg)  09/24/19 (!) 380 lb (172.4 kg)     PHYSICAL EXAM General: NAD, obese HEENT: Normal. Neck: No JVD, no thyromegaly. Lungs: Clear to auscultation bilaterally with normal respiratory effort. CV: Regular rate and rhythm, normal S1/diminished S2, no S3/S4, 3/6 ejection systolic murmur loudest at right upper sternal border.  Chronic bilateral lower extremity edema.   Abdomen: Soft, obese.  Neurologic: Alert and oriented.  Psych: Normal affect. Skin: Normal. Musculoskeletal: No gross deformities.      Labs: Lab Results  Component Value Date/Time   K 4.6 10/22/2019 09:00 AM   BUN 16 10/22/2019 09:00 AM   BUN 12 09/10/2019 12:31 PM   CREATININE 0.79 10/22/2019 09:00 AM   CREATININE 0.77 01/07/2017 10:40 AM   ALT 23 10/22/2019 09:00 AM   HGB 13.6 09/24/2019 08:02 AM   HGB 14.0 09/10/2019 12:31 PM     Lipids: Lab Results  Component Value Date/Time   LDLCALC 98 10/22/2019 09:00 AM   CHOL 160 10/22/2019 09:00 AM   TRIG 119 10/22/2019 09:00 AM   HDL 38 (L) 10/22/2019 09:00 AM       ASSESSMENT AND PLAN: 1.  Severe symptomatic aortic stenosis: He is in the process of being evaluated for aortic valve replacement by the structural heart team and CT surgery and he will likely undergo conventional surgery.  2.  Coronary artery disease: Cardiac catheterization reviewed above with moderate proximal to mid  LAD disease (40 to 50% stenosis) and otherwise mild nonobstructive disease.  Continue aspirin and atorvastatin.  3.  Hypertension: BP is normal.  No changes to therapy.  4.  Hyperlipidemia: On atorvastatin 40 mg.  5.  Morbid obesity: He needs significant weight loss.    Disposition: Follow up with structural heart team as previously scheduled.  Follow-up with me as needed.   10/24/2019, M.D., F.A.C.C.

## 2019-10-23 NOTE — Patient Instructions (Signed)
Medication Instructions: Your physician recommends that you continue on your current medications as directed. Please refer to the Current Medication list given to you today.   Labwork: None  Procedures/Testing: None  Follow-Up: As needed with Dr.Koneswaran  Any Additional Special Instructions Will Be Listed Below (If Applicable).  Follow up with Dr.Cooper and Dr.Owen   If you need a refill on your cardiac medications before your next appointment, please call your pharmacy.     Thank you for choosing [facility] !

## 2019-10-23 NOTE — Progress Notes (Signed)
[place], [place] - [address] [place] [address] Phone: [phone] Fax: [phone]    Your procedure is scheduled on Friday, January 29th.  Report to [facility] Main Entrance "A" at 5:30 A.M., and check in at the Admitting office.  Call this number if you have problems the morning of surgery:  [phone]  Call [phone] if you have any questions prior to your surgery date Monday-Friday 8am-4pm   Remember:  Do not eat or drink after midnight the night before your surgery. (Thursday, 10/25/2019)   Take these medicines the morning of surgery with A SIP OF WATER  atorvastatin (LIPITOR)  metoprolol tartrate (LOPRESSOR)  Follow your surgeon's instructions on when to stop Aspirin.  If no instructions were given by your surgeon then you will need to call the office to get those instructions.    As of today, STOP taking Aspirin, any Nonsteroidal anti-inflammatory drugs, Aleve, Naproxen, Ibuprofen, Motrin, Advil, Goody's, BC's, all herbal medications, fish oil, and all vitamins.  WHAT DO I DO ABOUT MY DIABETES MEDICATION?  . THE NIGHT BEFORE SURGERY, take 15  units of Insulin Glargine (LANTUS SOLOSTAR)  . Do not take sitaGLIPtin-metformin (JANUMET)/oral diabetes medicines (pills) the morning of surgery.  Do not take insulin aspart (NOVOLOG FLEXPEN) the morning of surgery.  . If your CBG is greater than 220 mg/dL, you may take  of your sliding scale (correction) dose of insulin.  HOW TO MANAGE YOUR DIABETES BEFORE AND AFTER SURGERY  Why is it important to control my blood sugar before and after surgery? . Improving blood sugar levels before and after surgery helps healing and can limit problems. . A way of improving blood sugar control is eating a healthy diet by: o  Eating less sugar and carbohydrates o  Increasing activity/exercise o  Talking with your doctor about reaching your blood sugar goals . High blood sugars  (greater than 180 mg/dL) can raise your risk of infections and slow your recovery, so you will need to focus on controlling your diabetes during the weeks before surgery. . Make sure that the doctor who takes care of your diabetes knows about your planned surgery including the date and location.  How do I manage my blood sugar before surgery? . Check your blood sugar at least 4 times a day, starting 2 days before surgery, to make sure that the level is not too high or low. . Check your blood sugar the morning of your surgery when you wake up and every 2 hours until you get to the Short Stay unit. o If your blood sugar is less than 70 mg/dL, you will need to treat for low blood sugar: - Do not take insulin. - Treat a low blood sugar (less than 70 mg/dL) with  cup of clear juice (cranberry or apple), 4 glucose tablets, OR glucose gel. - Recheck blood sugar in 15 minutes after treatment (to make sure it is greater than 70 mg/dL). If your blood sugar is not greater than 70 mg/dL on recheck, call [phone] for further instructions. . Report your blood sugar to the short stay nurse when you get to Short Stay.  . If you are admitted to the hospital after surgery: o Your blood sugar will be checked by the staff and you will probably be given insulin after surgery (instead of oral diabetes medicines) to make sure you have good blood sugar levels. o The goal for blood sugar control after surgery is  80-180 mg/dL.  The Morning of Surgery  Do not wear jewelry.  Do not wear lotions, powders, colognes, or deodorant  Do not shave 48 hours prior to surgery.  Men may shave face and neck.  Do not bring valuables to the hospital.  [facility] is not responsible for any belongings or valuables.  If you are a smoker, DO NOT Smoke 24 hours prior to surgery  If you wear a CPAP at night please bring your mask the morning of surgery   Remember that you must have someone to transport you home after your surgery,  and remain with you for 24 hours if you are discharged the same day.  Please bring cases for contacts, glasses, hearing aids, dentures or bridgework because it cannot be worn into surgery.   eave your suitcase in the car.  After surgery it may be brought to your room.  For patients admitted to the hospital, discharge time will be determined by your treatment team.  Patients discharged the day of surgery will not be allowed to drive home.   Special instructions:   [place]- Preparing For Surgery  Before surgery, you can play an important role. Because skin is not sterile, your skin needs to be as free of germs as possible. You can reduce the number of germs on your skin by washing with CHG (chlorahexidine gluconate) Soap before surgery.  CHG is an antiseptic cleaner which kills germs and bonds with the skin to continue killing germs even after washing.    Oral Hygiene is also important to reduce your risk of infection.  Remember - BRUSH YOUR TEETH THE MORNING OF SURGERY WITH YOUR REGULAR TOOTHPASTE  Please do not use if you have an allergy to CHG or antibacterial soaps. If your skin becomes reddened/irritated stop using the CHG.  Do not shave (including legs and underarms) for at least 48 hours prior to first CHG shower. It is OK to shave your face.  Please follow these instructions carefully.   1. Shower the NIGHT BEFORE SURGERY and the MORNING OF SURGERY with CHG Soap.   2. If you chose to wash your hair, wash your hair first as usual with your normal shampoo.  3. After you shampoo, rinse your hair and body thoroughly to remove the shampoo.  4. Use CHG as you would any other liquid soap. You can apply CHG directly to the skin and wash gently with a scrungie or a clean washcloth.   5. Apply the CHG Soap to your body ONLY FROM THE NECK DOWN.  Do not use on open wounds or open sores. Avoid contact with your eyes, ears, mouth and genitals (private parts). Wash Face and genitals (private  parts)  with your normal soap.   6. Wash thoroughly, paying special attention to the area where your surgery will be performed.  7. Thoroughly rinse your body with warm water from the neck down.  8. DO NOT shower/wash with your normal soap after using and rinsing off the CHG Soap.  9. Pat yourself dry with a CLEAN TOWEL.  10. Wear CLEAN PAJAMAS to bed the night before surgery, wear comfortable clothes the morning of surgery  11. Place CLEAN SHEETS on your bed the night of your first shower and DO NOT SLEEP WITH PETS.  Day of Surgery: Please shower the morning of surgery with the CHG soap Do not apply any deodorants/lotions. Please wear clean clothes to the hospital/surgery center.   Remember to brush your teeth WITH YOUR  REGULAR TOOTHPASTE.  Please read over the following fact sheets that you were given.

## 2019-10-23 NOTE — Progress Notes (Signed)
There were no vitals taken for this visit.   Subjective:    Patient ID: Adam Long, male    DOB: [birth year], 44 y.o.   MRN: [id number]  HPI: Adam Long is a 44 y.o. male presenting on 10/23/2019 for No chief complaint on file.   HPI   This is a telemedicine appointment through [employer] due to coronavirus pandemic.  I connected with  [name] on 10/23/19 by a video enabled telemedicine application and verified that I am speaking with the correct person using two identifiers.   I discussed the limitations of evaluation and management by telemedicine. The patient expressed understanding and agreed to proceed.  Pt is at home.  Provider is at office.     Pt is a 27 yoM with uncontrolled DM, HTN, dyslipidemia and morbid obesity who is planned for aortic valve replacement in the near future.  He has appointment to follow up with surgeon on 11/05/19.    Pt says his automatic bp machine broke.  bp at CV surgeon office 10/15/19 was 121/77.  He is scheduled for dental extraction Friday in preparation for his valve surgery.  Pt has appointment with cardiology later this morning.  He is followed by endocrinology for his DM.   His last a1c was in October- was 11.5.  Pt has appointment to follow up with endocrine later this week.  Pt says he is doing okay and he is having no new problems.          Relevant past medical, surgical, family and social history reviewed and updated as indicated. Interim medical history since our last visit reviewed. Allergies and medications reviewed and updated.    Current Outpatient Medications:  .  aspirin EC 81 MG tablet, Take 81 mg by mouth daily., Disp: , Rfl:  .  atorvastatin (LIPITOR) 20 MG tablet, Take 1 tablet (20 mg total) by mouth daily., Disp: 90 tablet, Rfl: 0 .  insulin aspart (NOVOLOG FLEXPEN) 100 UNIT/ML FlexPen, Inject 6-12 Units into the skin 3 (three) times daily before meals. (Patient taking differently: Inject 6-12 Units  into the skin 3 (three) times daily before meals. Sliding scale), Disp: 5 pen, Rfl: 2 .  Insulin Glargine (LANTUS SOLOSTAR) 100 UNIT/ML Solostar Pen, Inject 20 Units into the skin at bedtime. (Patient taking differently: Inject 30 Units into the skin at bedtime. ), Disp: 5 pen, Rfl: 2 .  lisinopril (ZESTRIL) 20 MG tablet, Take 1 tablet (20 mg total) by mouth daily., Disp: 90 tablet, Rfl: 0 .  metoprolol tartrate (LOPRESSOR) 50 MG tablet, Take 1 tablet (50 mg total) by mouth 2 (two) times daily., Disp: 180 tablet, Rfl: 0 .  sitaGLIPtin-metformin (JANUMET) 50-500 MG tablet, Take 1 tablet by mouth 2 (two) times daily with a meal., Disp: 180 tablet, Rfl: 0    Review of Systems  Per HPI unless specifically indicated above     Objective:    There were no vitals taken for this visit.  Wt Readings from Last 3 Encounters:  10/15/19 (!) 372 lb (168.7 kg)  09/24/19 (!) 380 lb (172.4 kg)  09/18/19 (!) 380 lb (172.4 kg)    Physical Exam Constitutional:      General: He is not in acute distress.    Appearance: He is obese. He is not ill-appearing or toxic-appearing.  HENT:     Head: Normocephalic and atraumatic.  Pulmonary:     Effort: No respiratory distress.  Neurological:     Mental Status: He is  alert and oriented to person, place, and time.  Psychiatric:        Attention and Perception: Attention normal.        Speech: Speech normal.        Behavior: Behavior normal. Behavior is cooperative.     Results for orders placed or performed during the hospital encounter of 10/22/19  Comprehensive metabolic panel  Result Value Ref Range   Sodium 133 (L) 135 - 145 mmol/L   Potassium 4.6 3.5 - 5.1 mmol/L   Chloride 96 (L) 98 - 111 mmol/L   CO2 26 22 - 32 mmol/L   Glucose, Bld 162 (H) 70 - 99 mg/dL   BUN 16 6 - 20 mg/dL   Creatinine, Ser 2.97 0.61 - 1.24 mg/dL   Calcium 9.3 8.9 - 98.9 mg/dL   Total Protein 7.5 6.5 - 8.1 g/dL   Albumin 3.9 3.5 - 5.0 g/dL   AST 18 15 - 41 U/L   ALT 23 0  - 44 U/L   Alkaline Phosphatase 45 38 - 126 U/L   Total Bilirubin 1.2 0.3 - 1.2 mg/dL   GFR calc non Af Amer >60 >60 mL/min   GFR calc Af Amer >60 >60 mL/min   Anion gap 11 5 - 15  Lipid panel  Result Value Ref Range   Cholesterol 160 0 - 200 mg/dL   Triglycerides 211 <941 mg/dL   HDL 38 (L) >74 mg/dL   Total CHOL/HDL Ratio 4.2 RATIO   VLDL 24 0 - 40 mg/dL   LDL Cholesterol 98 0 - 99 mg/dL      Assessment & Plan:     1. Dyslipidemia Reviewed labs with pt.  In light of co-morbid conditions, will increase atorvastatin to 40mg .  Pt encouraged to follow lowfat diet.  Will recheck in 3 months.  2. Uncontrolled DM Pt to Continue with endocrinology for management of  DM. Pt is to stop by office to drop off medicaid denial letter and sign application to get his novolog through assistance program.   3. AS Pt being evaluated by CV surgery  4. HTN Well controlled  5. Morbid obesity Pt to continue with dietician for help with eating healthy diet.  Hopefully he will be more able to exercise after his valve surgery.   Pt is counseled to contact office prior to his follow up in 3 months if he needs anything or develops any problems.

## 2019-10-24 ENCOUNTER — Other Ambulatory Visit: Payer: Self-pay

## 2019-10-24 ENCOUNTER — Encounter (HOSPITAL_COMMUNITY)
Admission: RE | Admit: 2019-10-24 | Discharge: 2019-10-24 | Disposition: A | Discharge status: Home / Self Care | Payer: Self-pay | Source: Ambulatory Visit | Attending: Dentistry | Admitting: Dentistry

## 2019-10-24 ENCOUNTER — Encounter (HOSPITAL_COMMUNITY): Payer: Self-pay

## 2019-10-24 ENCOUNTER — Other Ambulatory Visit (HOSPITAL_COMMUNITY)
Admission: RE | Admit: 2019-10-24 | Discharge: 2019-10-24 | Disposition: A | Discharge status: Home / Self Care | Payer: HRSA Program | Source: Ambulatory Visit | Attending: Dentistry | Admitting: Dentistry

## 2019-10-24 DIAGNOSIS — Z7982 Long term (current) use of aspirin: Secondary | ICD-10-CM | POA: Insufficient documentation

## 2019-10-24 DIAGNOSIS — E7800 Pure hypercholesterolemia, unspecified: Secondary | ICD-10-CM | POA: Insufficient documentation

## 2019-10-24 DIAGNOSIS — I10 Essential (primary) hypertension: Secondary | ICD-10-CM | POA: Insufficient documentation

## 2019-10-24 DIAGNOSIS — Z20822 Contact with and (suspected) exposure to covid-19: Secondary | ICD-10-CM | POA: Insufficient documentation

## 2019-10-24 DIAGNOSIS — I350 Nonrheumatic aortic (valve) stenosis: Secondary | ICD-10-CM | POA: Insufficient documentation

## 2019-10-24 DIAGNOSIS — Z6841 Body Mass Index (BMI) 40.0 and over, adult: Secondary | ICD-10-CM | POA: Insufficient documentation

## 2019-10-24 DIAGNOSIS — Z01812 Encounter for preprocedural laboratory examination: Secondary | ICD-10-CM | POA: Insufficient documentation

## 2019-10-24 DIAGNOSIS — Z20821 Contact with and (suspected) exposure to Zika virus: Secondary | ICD-10-CM | POA: Diagnosis present

## 2019-10-24 DIAGNOSIS — Z89422 Acquired absence of other left toe(s): Secondary | ICD-10-CM | POA: Insufficient documentation

## 2019-10-24 DIAGNOSIS — E119 Type 2 diabetes mellitus without complications: Secondary | ICD-10-CM | POA: Insufficient documentation

## 2019-10-24 DIAGNOSIS — I272 Pulmonary hypertension, unspecified: Secondary | ICD-10-CM | POA: Insufficient documentation

## 2019-10-24 DIAGNOSIS — Z79899 Other long term (current) drug therapy: Secondary | ICD-10-CM | POA: Insufficient documentation

## 2019-10-24 DIAGNOSIS — I2510 Atherosclerotic heart disease of native coronary artery without angina pectoris: Secondary | ICD-10-CM | POA: Insufficient documentation

## 2019-10-24 DIAGNOSIS — Z794 Long term (current) use of insulin: Secondary | ICD-10-CM | POA: Insufficient documentation

## 2019-10-24 DIAGNOSIS — K0530 Chronic periodontitis, unspecified: Secondary | ICD-10-CM | POA: Insufficient documentation

## 2019-10-24 HISTORY — DX: Atherosclerotic heart disease of native coronary artery without angina pectoris: I25.10

## 2019-10-24 HISTORY — DX: Nonrheumatic aortic (valve) stenosis: I35.0

## 2019-10-24 HISTORY — DX: Cardiac murmur, unspecified: R01.1

## 2019-10-24 LAB — BASIC METABOLIC PANEL
Anion gap: 7 (ref 5–15)
BUN: 13 mg/dL (ref 6–20)
CO2: 27 mmol/L (ref 22–32)
Calcium: 9.4 mg/dL (ref 8.9–10.3)
Chloride: 101 mmol/L (ref 98–111)
Creatinine, Ser: 0.82 mg/dL (ref 0.61–1.24)
GFR calc Af Amer: >60 mL/min (ref >60)
GFR calc non Af Amer: >60 mL/min (ref >60)
Glucose, Bld: 173 mg/dL — ABNORMAL HIGH (ref 70–99)
Potassium: 4.9 mmol/L (ref 3.5–5.1)
Sodium: 135 mmol/L (ref 135–145)

## 2019-10-24 LAB — SARS CORONAVIRUS 2 (TAT 6-24 HRS): SARS Coronavirus 2: NEGATIVE

## 2019-10-24 LAB — CBC
HCT: 42.6 % (ref 39.0–52.0)
Hemoglobin: 14 g/dL (ref 13.0–17.0)
MCH: 29.7 pg (ref 26.0–34.0)
MCHC: 32.9 g/dL (ref 30.0–36.0)
MCV: 90.3 fL (ref 80.0–100.0)
Platelets: 232 10*3/uL (ref 150–400)
RBC: 4.72 MIL/uL (ref 4.22–5.81)
RDW: 12.6 % (ref 11.5–15.5)
WBC: 9.1 10*3/uL (ref 4.0–10.5)
nRBC: 0 % (ref 0.0–0.2)

## 2019-10-24 LAB — HEMOGLOBIN A1C
Hgb A1c MFr Bld: 7.4 % — ABNORMAL HIGH (ref 4.8–5.6)
Mean Plasma Glucose: 165.68 mg/dL

## 2019-10-24 LAB — GLUCOSE, CAPILLARY: Glucose-Capillary: 156 mg/dL — ABNORMAL HIGH (ref 70–99)

## 2019-10-24 NOTE — Anesthesia Preprocedure Evaluation (Deleted)
Anesthesia Evaluation    Airway        Dental   Pulmonary           Cardiovascular hypertension,      Neuro/Psych    GI/Hepatic   Endo/Other  diabetes  Renal/GU      Musculoskeletal   Abdominal   Peds  Hematology   Anesthesia Other Findings   Reproductive/Obstetrics                             Anesthesia Physical Anesthesia Plan  ASA:   Anesthesia Plan:    Post-op Pain Management:    Induction:   PONV Risk Score and Plan:   Airway Management Planned:   Additional Equipment:   Intra-op Plan:   Post-operative Plan:   Informed Consent:   Plan Discussed with:   Anesthesia Plan Comments: (PAT note written 10/24/2019 by Shonna Chock, PA-C. )        Anesthesia Quick Evaluation

## 2019-10-24 NOTE — Progress Notes (Signed)
PCP - Jacquelin Hawking PA-C Cardiologist - Excell Seltzer MD Endocrinology for diabetes management - Purcell Nails MD  Chest x-ray - 07/13/19 EKG - 09/10/19 Stress Test - pt denies ECHO - 06/12/19 Cardiac Cath - 09/24/19   Fasting Blood Sugar - 150s Checks Blood Sugar: 3x times a day  Aspirin Instructions: pt instructed to call surgeon for when to stop aspirin.  Follow your surgeon's instructions on when to stop Aspirin.  If no instructions were given by your surgeon then you will need to call the office to get those instructions.      COVID TEST- 10/24/19 - after PAT appointment   Anesthesia review: yes- per MD order, cardiac hx  Patient denies shortness of breath, fever, cough and chest pain at PAT appointment   All instructions explained to the patient, with a verbal understanding of the material. Patient agrees to go over the instructions while at home for a better understanding. Patient also instructed to self quarantine after being tested for COVID-19. The opportunity to ask questions was provided.

## 2019-10-24 NOTE — Progress Notes (Signed)
Anesthesia PAT Evaluation:  Case: [id number] Date/Time: 10/26/19 0715   Procedure: MULTIPLE EXTRACTION WITH ALVEOLOPLASTY (N/A )   Anesthesia type: General   Pre-op diagnosis: SEVERE AORTIC STENOSIS, CHORNIC PERIODONTITIS   Location: [facility] ROOM 04 / [place] [facility]   Surgeons: Lenn Cal, DDS      DISCUSSION: Patient is a 44 year old male scheduled for the above procedure.  He is being evaluated for future AVR versus TAVR for severe AS. Office visit/H&P done on 10/23/19 by Dr. Bronson Ing.  History includes never smoker, HTN, DM2, hypercholesterolemia, murmur (bicuspid AV with severe AS), exertional dyspnea, left foot gangrene following puncture wound (s/p TMA of left 4th & 5th toes 07/09/19). Non-obstructive CAD (40-50% LAD, severe pulmonary hypertension with mean PA pressure 51 mmHg, severe AS) by 08/2019 cath. BMI is consistent with morbid obesity.  Anesthesia consult requested to assess ability to proceed with general anesthesia with nasoendotracheal tube. All remaining teeth to be extracted. Patient denied nasal fracture, anticoagulants (only ASA 81 mg), epistaxis, chronic sinus or nasal obstructive issues. He is morbidly obese, but denied known history of OSA. He tends to sleep with head elevated, usually 3 pillows. He does not tolerate lying flat, but he's unsure if more of from his cardiopulmonary status or anxiety--possibly a combination. He reports that he has panic attacks which have worsening during the COVID pandemic and particularly now as he is facing aortic valve surgery. He denied recent syncope. No new SOB. Denied chest pain or significant palpitations. He reports chronic pedal edema, left > right. No known personal of family history of anesthesia complications. He does not recall ever having general anesthesia in the past. Discussed that he would be considered for nasendotracheal tube for surgery, but definitive decision following evaluation by his assigned anesthesiologist.   His A1c is  7.4%, but with good improvement since 06/2019 (11.5% 06/28/19, 12.0% 06/11/19). He reports home CBGs rarely > 200 now.   10/24/19 pre-surgical COVID-19 test is in process. Anesthesia team to evaluate on the day of surgery.    VS: BP 116/82   Pulse 82   Temp 36.7 C (Oral)   Resp 18   Ht 5\' 10"  (1.778 m)   Wt (!) 174.1 kg   SpO2 100%   BMI 55.08 kg/m  Heart RRR, III/VI SEM. Lungs clear.    PROVIDERS: Soyla Dryer, PA-C is PCP  Kate Sable, MD is primary cardiologist and Sherren Mocha, MD for consideration of TAVR. Darylene Price, MD is CT surgeon.  Barnie Alderman, MD is endocrinologist [place], Sherren Mocha, MD is vascular surgeon   LABS: Labs reviewed: Acceptable for surgery. (all labs ordered are listed, but only abnormal results are displayed)  Labs Reviewed  GLUCOSE, CAPILLARY - Abnormal; Notable for the following components:      Result Value   Glucose-Capillary 156 (*)    All other components within normal limits  HEMOGLOBIN A1C - Abnormal; Notable for the following components:   Hgb A1c MFr Bld 7.4 (*)    All other components within normal limits  BASIC METABOLIC PANEL - Abnormal; Notable for the following components:   Glucose, Bld 173 (*)    All other components within normal limits  CBC     IMAGES: CTA chest/abd/pelvis 10/05/19: IMPRESSION: 1. Vascular findings and measurements pertinent to potential TAVR procedure, as detailed above. 2. Severe thickening and calcification of the aortic valve, compatible with reported clinical history of severe aortic stenosis. 3. Aortic atherosclerosis, in addition to left main and 3 vessel coronary artery  disease. Please note that although the presence of coronary artery calcium documents the presence of coronary artery disease, the severity of this disease and any potential stenosis cannot be assessed on this non-gated CT examination. Assessment for potential risk factor modification, dietary therapy or  pharmacologic therapy may be warranted, if clinically indicated. 4. There is also ectasia of the ascending thoracic aorta (4.3 cm in diameter). 5. Hepatic steatosis. 6. Cholelithiasis without evidence of acute cholecystitis at this time.   EKG: 09/10/19: NSR, non-specific T wave abnormality.   CV: CT coronary 10/05/19:  IMPRESSION: 1. Average quality study due to moderate signal- to-noise artifact due to severe obesity (BMI 55). 2. Bicuspid valve, Sievers type 0 (non-raphe). Severely calcified with restricted leaflet motion. 3. Annular area 6.51 cm2. Appropriate for 29 mm Edwards Sapien 3 TAVR. 4. Sufficient coronary to annulus distance. 5. Optimum Fluoroscopic Angle for Delivery: LAO 6 CRA 11 6. No thrombus in the left atrial appendage. 7. 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 8. Severe 3-vessel calcifications noted.   Cardiac cath 09/24/19: LM: Mildly calcified. Patent without stenosis. Divides into the LAD and left circumflex. LAD: Mild diffuse disease throughout the vessel. The vessel is severely calcified. Mid LAD after the first diagonal has 40 to 50% stenosis.  LCX: Vessel is small with luminal irregularities. Small OM1. RCA: Large vessel. Mild diffuse disease throughout vessel. Divides into the PDA and posterior AV segment which supplies a single PLA branch.  1.  Calcified coronary arteries with moderate mid LAD stenosis, otherwise mild nonobstructive disease 2.  Known severe aortic stenosis by echo assessment with heavy calcification and restriction of the aortic valve leaflets as assessed by plain fluoroscopy 3.  Severe pulmonary hypertension with mean PA pressure 51 mmHg, at least in part to left heart disease with elevated wedge pressure of 31 mmHg Recommendations: Ongoing multidisciplinary heart team evaluation for treatment of severe symptomatic aortic stenosis.  Medical therapy for nonobstructive CAD.   Echo 06/12/19: IMPRESSIONS  1. The left ventricle has normal  systolic function, with an ejection fraction of 55-60%. The cavity size was normal. There is mildly increased left ventricular wall thickness. Left ventricular diastolic Doppler parameters are consistent with  pseudonormalization. Elevated mean left atrial pressure.  2. The right ventricle has normal systolic function. The cavity was normal. There is no increase in right ventricular wall thickness.  3. The aortic valve has an indeterminate number of cusps. Severely thickening of the aortic valve. Severe calcifcation of the aortic valve. Severe stenosis of the aortic valve. Severe aortic annular calcification noted.  4. The mitral valve is abnormal. Moderate thickening of the mitral valve leaflet. Moderate calcification of the mitral valve leaflet. There is moderate mitral annular calcification present. No evidence of mitral valve stenosis.  5. The aorta is normal unless otherwise noted.  6. The aortic root is normal in size and structure.  7. Pulmonary hypertension is indeterminant, inadequate TR jet.  8. The interatrial septum was not well visualized.   Carotid US 06/11/19: IMPRESSION: Color duplex indicates no significant plaque, with no hemodynamically significant stenosis by duplex criteria in the extracranial cerebrovascular circulation.   Past Medical History:  Diagnosis Date  . Anxiety   . Cholelithiasis   . Coronary artery disease    non-obstructive CAD (40-50% LAD) 08/2019  . Depression   . Diabetes mellitus   . Dyspnea    per pt upon exertion  . Heart murmur    per pt 10/24/19 dx in 06/2019  . Hypercholesteremia   .  Hypertension   . Morbid obesity (HCC)     Past Surgical History:  Procedure Laterality Date  . ABDOMINAL AORTOGRAM W/LOWER EXTREMITY N/A 07/06/2019   Procedure: ABDOMINAL AORTOGRAM W/LOWER EXTREMITY;  Surgeon: Sherren Kerns, MD;  Location: [facility] INVASIVE CV LAB;  Service: Cardiovascular;  Laterality: N/A;  Bilateral  . AMPUTATION Left 07/09/2019    Procedure: LEFT FOURTH and Fifth TOE AMPUTATION.;  Surgeon: Larina Earthly, MD;  Location: [facility] OR;  Service: Vascular;  Laterality: Left;  . IR RADIOLOGY PERIPHERAL GUIDED IV START  10/05/2019  . IR US GUIDE VASC ACCESS RIGHT  10/05/2019  . RIGHT HEART CATH AND CORONARY ANGIOGRAPHY N/A 09/24/2019   Procedure: RIGHT HEART CATH AND CORONARY ANGIOGRAPHY;  Surgeon: Tonny Bollman, MD;  Location: [facility] INVASIVE CV LAB;  Service: Cardiovascular;  Laterality: N/A;    MEDICATIONS: . aspirin EC 81 MG tablet  . atorvastatin (LIPITOR) 40 MG tablet  . insulin aspart (NOVOLOG FLEXPEN) 100 UNIT/ML FlexPen  . Insulin Glargine (LANTUS SOLOSTAR) 100 UNIT/ML Solostar Pen  . lisinopril (ZESTRIL) 20 MG tablet  . metoprolol tartrate (LOPRESSOR) 50 MG tablet  . sitaGLIPtin-metformin (JANUMET) 50-500 MG tablet   No current facility-administered medications for this encounter.    Shonna Chock, PA-C Surgical Short Stay/Anesthesiology [facility] Phone [phone] [facility] Phone [phone] 10/24/2019 2:09 PM

## 2019-10-25 ENCOUNTER — Encounter: Payer: Self-pay | Attending: "Endocrinology | Admitting: Nutrition

## 2019-10-25 ENCOUNTER — Encounter: Payer: Self-pay | Admitting: Nutrition

## 2019-10-25 ENCOUNTER — Encounter: Payer: Self-pay | Admitting: "Endocrinology

## 2019-10-25 ENCOUNTER — Ambulatory Visit (INDEPENDENT_AMBULATORY_CARE_PROVIDER_SITE_OTHER): Payer: Self-pay | Admitting: "Endocrinology

## 2019-10-25 VITALS — BP 102/71 | HR 76 | Ht 70.0 in | Wt 390.0 lb

## 2019-10-25 DIAGNOSIS — E1165 Type 2 diabetes mellitus with hyperglycemia: Secondary | ICD-10-CM

## 2019-10-25 DIAGNOSIS — I10 Essential (primary) hypertension: Secondary | ICD-10-CM

## 2019-10-25 DIAGNOSIS — E118 Type 2 diabetes mellitus with unspecified complications: Secondary | ICD-10-CM

## 2019-10-25 DIAGNOSIS — E782 Mixed hyperlipidemia: Secondary | ICD-10-CM

## 2019-10-25 DIAGNOSIS — IMO0002 Reserved for concepts with insufficient information to code with codable children: Secondary | ICD-10-CM

## 2019-10-25 MED ORDER — DEXTROSE 5 % IV SOLN
3.0000 g | Freq: Once | INTRAVENOUS | Status: AC
  Administered 2019-10-26: 08:00:00 3 g via INTRAVENOUS
  Filled 2019-10-25: qty 3
  Filled 2019-10-25: qty 3000

## 2019-10-25 MED ORDER — LANTUS SOLOSTAR 100 UNIT/ML ~~LOC~~ SOPN: 40.0000 [IU] | PEN_INJECTOR | Freq: Every day | SUBCUTANEOUS | Status: DC

## 2019-10-25 NOTE — Anesthesia Preprocedure Evaluation (Addendum)
Anesthesia Evaluation  Patient identified by MRN, date of birth, ID band Patient awake    Reviewed: Allergy & Precautions, H&P , NPO status , Patient's Chart, lab work & pertinent test results  History of Anesthesia Complications Negative for: history of anesthetic complications  Airway Mallampati: IV  TM Distance: >3 FB Neck ROM: full  Mouth opening: Limited Mouth Opening  Dental  (+) Poor Dentition, Dental Advisory Given   Pulmonary neg pulmonary ROS,    Pulmonary exam normal        Cardiovascular Exercise Tolerance: Good hypertension, Pt. on medications and Pt. on home beta blockers + angina + Valvular Problems/Murmurs AS  Rhythm:Regular Rate:Normal + Systolic murmurs Echo 1/94   1. The left ventricle has normal systolic function, with an ejection fraction of 55-60%. The cavity size was normal. There is mildly increased left ventricular wall thickness. Left ventricular diastolic Doppler parameters are consistent with  pseudonormalization. Elevated mean left atrial pressure.  2. The right ventricle has normal systolic function. The cavity was normal. There is no increase in right ventricular wall thickness.  3. The aortic valve has an indeterminate number of cusps. Severely thickening of the aortic valve. Severe calcifcation of the aortic valve. Severe stenosis of the aortic valve. Severe aortic annular calcification noted.  4. The mitral valve is abnormal. Moderate thickening of the mitral valve leaflet. Moderate calcification of the mitral valve leaflet. There is moderate mitral annular calcification present. No evidence of mitral valve stenosis.   Neuro/Psych PSYCHIATRIC DISORDERS Anxiety Depression negative neurological ROS     GI/Hepatic negative GI ROS, Neg liver ROS,   Endo/Other  diabetesMorbid obesity  Renal/GU negative Renal ROS  negative genitourinary   Musculoskeletal   Abdominal   Peds   Hematology negative hematology ROS (+)   Anesthesia Other Findings   Reproductive/Obstetrics negative OB ROS                          Anesthesia Physical  Anesthesia Plan  ASA: III  Anesthesia Plan: MAC   Post-op Pain Management:    Induction:   PONV Risk Score and Plan: 1 and Ondansetron  Airway Management Planned: Oral ETT, Nasal ETT and Video Laryngoscope Planned  Additional Equipment:   Intra-op Plan:   Post-operative Plan: Extubation in OR  Informed Consent: I have reviewed the patients History and Physical, chart, labs and discussed the procedure including the risks, benefits and alternatives for the proposed anesthesia with the patient or authorized representative who has indicated his/her understanding and acceptance.     Dental Advisory Given  Plan Discussed with: CRNA and Anesthesiologist  Anesthesia Plan Comments:         Anesthesia Quick Evaluation

## 2019-10-25 NOTE — Patient Instructions (Signed)

## 2019-10-25 NOTE — Progress Notes (Signed)
  Medical Nutrition Therapy:  Appt start time: 0930 end time:  1000.   Assessment:  Primary concerns today: Diabetes Type 2 and Obesity. A1C down to 7.4% from 11.5%.  Changed: Been trying to eat better. Still works on scheduling meals. Gettiing worked up for his heart vavlue replacement.  Going to get dental surgery tomorrow to be done in the hospital-[facility]. Left foot 4-5 toes had been amputated in October. Feeling good since surgery. He does some walking but limited due to his heart issues.  [id number], 150's before lunch Before dinner 140-160's and Bed time  140-160's.  To see Dr. Fransico Him today. Doing very well. Wants to exercise for weight loss but can't because of his heart issue.   Struggles with eating out fast food and working on thtat.  Lab Results  Component Value Date   HGBA1C 7.4 (H) 10/24/2019   CMP Latest Ref Rng & Units 10/24/2019 10/22/2019 09/24/2019  Glucose 70 - 99 mg/dL 177(L) 390(Z) -  BUN 6 - 20 mg/dL 13 16 -  Creatinine 0.09 - 1.24 mg/dL 2.33 0.07 -  Sodium 622 - 145 mmol/L 135 133(L) 134(L)  Potassium 3.5 - 5.1 mmol/L 4.9 4.6 4.7  Chloride 98 - 111 mmol/L 101 96(L) -  CO2 22 - 32 mmol/L 27 26 -  Calcium 8.9 - 10.3 mg/dL 9.4 9.3 -  Total Protein 6.5 - 8.1 g/dL - 7.5 -  Total Bilirubin 0.3 - 1.2 mg/dL - 1.2 -  Alkaline Phos 38 - 126 U/L - 45 -  AST 15 - 41 U/L - 18 -  ALT 0 - 44 U/L - 23 -   Lipid Panel     Component Value Date/Time   CHOL 160 10/22/2019 0900   TRIG 119 10/22/2019 0900   HDL 38 (L) 10/22/2019 0900   CHOLHDL 4.2 10/22/2019 0900   VLDL 24 10/22/2019 0900   LDLCALC 98 10/22/2019 0900  \ Preferred Learning Style:   No preference indicated   Learning Readiness:  Ready  Change in progress   MEDICATIONS:    DIETARY INTAKE:  24-hr recall:  B ( AM): 1/2 banana, bowl of Rice chex,,Snk ( AM):   L ( PM):  Had a late breakfast D Vegetable beef soup, crackers, 2%milk Usual physical activity:   Estimated energy needs: 1800  calories 200 g carbohydrates 135 g protein 50 g fat  Progress Towards Goal(s):  In progress.   Nutritional Diagnosis:  NB-1.1 Food and nutrition-related knowledge deficit As related to Diabetes and Obesity.  As evidenced by A1C 11% and BMI > 40.    Intervention:  Nutrition and Diabetes education provided on My Plate, CHO counting, meal planning, portion sizes, timing of meals, avoiding snacks between meals unless having a low blood sugar, target ranges for A1C and blood sugars, signs/symptoms and treatment of hyper/hypoglycemia, monitoring blood sugars, taking medications as prescribed, benefits of exercising 30 minutes per day and prevention of complications of DM.  Goals Keep up the great job! Goals  Eat meals on time Increase water intake Cut down on fast foods. Eat soft foods after dental surgery.  Teaching Method Utilized:  Visual Auditory Hands on  Handouts given during visit include:  The Plate Method  Meal Plan  Low Cholesterol Diet  Low Salt     Barriers to learning/adherence to lifestyle change: Heart condition   Demonstrated degree of understanding via:  Teach Back   Monitoring/Evaluation:  Dietary intake, exercise, , and body weight in 3-4 month(s).

## 2019-10-25 NOTE — Progress Notes (Signed)
10/25/2019  Endocrinology follow-up note   Subjective:    Patient ID: Adam Long, male    DOB: 12/07/1975. Patient is being seen in follow-up for management of currently uncontrolled, complicated type 2 diabetes . PCP:   Jacquelin Hawking, PA-C   Past Medical History:  Diagnosis Date  . Anxiety   . Aortic stenosis   . Cholelithiasis   . Coronary artery disease    non-obstructive CAD (40-50% LAD) 08/2019  . Depression   . Diabetes mellitus   . Dyspnea    per pt upon exertion  . Heart murmur    per pt 10/24/19 dx in 06/2019  . Hypercholesteremia   . Hypertension   . Morbid obesity (HCC)    Past Surgical History:  Procedure Laterality Date  . ABDOMINAL AORTOGRAM W/LOWER EXTREMITY N/A 07/06/2019   Procedure: ABDOMINAL AORTOGRAM W/LOWER EXTREMITY;  Surgeon: Sherren Kerns, MD;  Location: [facility] INVASIVE CV LAB;  Service: Cardiovascular;  Laterality: N/A;  Bilateral  . AMPUTATION Left 07/09/2019   Procedure: LEFT FOURTH and Fifth TOE AMPUTATION.;  Surgeon: Larina Earthly, MD;  Location: [facility] OR;  Service: Vascular;  Laterality: Left;  . IR RADIOLOGY PERIPHERAL GUIDED IV START  10/05/2019  . IR US GUIDE VASC ACCESS RIGHT  10/05/2019  . RIGHT HEART CATH AND CORONARY ANGIOGRAPHY N/A 09/24/2019   Procedure: RIGHT HEART CATH AND CORONARY ANGIOGRAPHY;  Surgeon: Tonny Bollman, MD;  Location: [facility] INVASIVE CV LAB;  Service: Cardiovascular;  Laterality: N/A;   Social History   Socioeconomic History  . Marital status: Single    Spouse name: Not on file  . Number of children: Not on file  . Years of education: Not on file  . Highest education level: Not on file  Occupational History  . Not on file  Tobacco Use  . Smoking status: Never Smoker  . Smokeless tobacco: Former Neurosurgeon    Types: Chew  Substance and Sexual Activity  . Alcohol use: Not Currently    Comment: occasionally  . Drug use: No  . Sexual activity: Not on file  Other Topics Concern  . Not on file  Social History  Narrative  . Not on file   Social Determinants of Health   Financial Resource Strain:   . Difficulty of Paying Living Expenses: Not on file  Food Insecurity:   . Worried About Programme researcher, broadcasting/film/video in the Last Year: Not on file  . Ran Out of Food in the Last Year: Not on file  Transportation Needs:   . Lack of Transportation (Medical): Not on file  . Lack of Transportation (Non-Medical): Not on file  Physical Activity:   . Days of Exercise per Week: Not on file  . Minutes of Exercise per Session: Not on file  Stress:   . Feeling of Stress : Not on file  Social Connections:   . Frequency of Communication with Friends and Family: Not on file  . Frequency of Social Gatherings with Friends and Family: Not on file  . Attends Religious Services: Not on file  . Active Member of Clubs or Organizations: Not on file  . Attends Banker Meetings: Not on file  . Marital Status: Not on file   Outpatient Encounter Medications as of 10/25/2019  Medication Sig  . aspirin EC 81 MG tablet Take 81 mg by mouth daily.  Marland Kitchen atorvastatin (LIPITOR) 40 MG tablet Take 1 tablet (40 mg total) by mouth daily.  . Insulin Glargine (LANTUS SOLOSTAR) 100 UNIT/ML Solostar  Pen Inject 40 Units into the skin at bedtime.  Marland Kitchen lisinopril (ZESTRIL) 20 MG tablet Take 1 tablet (20 mg total) by mouth daily.  . metoprolol tartrate (LOPRESSOR) 50 MG tablet Take 1 tablet (50 mg total) by mouth 2 (two) times daily.  . sitaGLIPtin-metformin (JANUMET) 50-500 MG tablet Take 1 tablet by mouth 2 (two) times daily with a meal.  . [DISCONTINUED] insulin aspart (NOVOLOG FLEXPEN) 100 UNIT/ML FlexPen Inject 6-12 Units into the skin 3 (three) times daily before meals. Sliding scale  . [DISCONTINUED] Insulin Glargine (LANTUS SOLOSTAR) 100 UNIT/ML Solostar Pen Inject 30 Units into the skin at bedtime.   Facility-Administered Encounter Medications as of 10/25/2019  Medication  . [START ON 10/26/2019] ceFAZolin (ANCEF) 3 g in dextrose  5 % 50 mL IVPB   ALLERGIES: Allergies  Allergen Reactions  . Pollen Extract Other (See Comments)    Runny nose, watery eyes, sneezing   VACCINATION STATUS: Immunization History  Administered Date(s) Administered  . Influenza Split 07/01/2015, 06/30/2016  . Influenza,inj,Quad PF,6+ Mos 06/30/2017, 07/30/2018, 06/12/2019  . Tdap 06/11/2019    Diabetes He presents for his follow-up diabetic visit. He has type 2 diabetes mellitus. Onset time: He was diagnosed at approximate age of 44 years. His disease course has been improving (After he disappeared from care, he recently returns with A1c of 44.5% .). There are no hypoglycemic associated symptoms. Pertinent negatives for hypoglycemia include no confusion, headaches, pallor or seizures. Pertinent negatives for diabetes include no blurred vision, no chest pain, no fatigue, no polydipsia, no polyphagia, no polyuria and no weakness. There are no hypoglycemic complications. Symptoms are improving. Diabetic complications include PVD. (Peripheral artery disease complicated by partial amputation of left foot.) Risk factors for coronary artery disease include diabetes mellitus, dyslipidemia, family history, obesity, male sex, hypertension and sedentary lifestyle. Current diabetic treatment includes intensive insulin program and oral agent (dual therapy). He is compliant with treatment none of the time. His weight is increasing steadily. He is following a generally unhealthy diet. When asked about meal planning, he reported none. He has not had a previous visit with a dietitian. He never participates in exercise. His breakfast blood glucose range is generally 140-180 mg/dl. His lunch blood glucose range is generally 140-180 mg/dl. His dinner blood glucose range is generally 140-180 mg/dl. His bedtime blood glucose range is generally 140-180 mg/dl. His overall blood glucose range is 140-180 mg/dl. (He came with significantly improved glycemic profile, a1c of  7.4%. ) An ACE inhibitor/angiotensin II receptor blocker is being taken. Eye exam is current.  Hyperlipidemia This is a chronic problem. The current episode started more than 1 year ago. The problem is uncontrolled. Recent lipid tests were reviewed and are high. Exacerbating diseases include diabetes and obesity. Pertinent negatives include no chest pain, myalgias or shortness of breath. Current antihyperlipidemic treatment includes statins. Risk factors for coronary artery disease include diabetes mellitus, dyslipidemia, hypertension, male sex, obesity, a sedentary lifestyle and family history.  Hypertension This is a chronic problem. The current episode started more than 1 year ago. The problem is controlled. Pertinent negatives include no blurred vision, chest pain, headaches, neck pain, palpitations or shortness of breath. Risk factors for coronary artery disease include diabetes mellitus, dyslipidemia, male gender, obesity and sedentary lifestyle. Past treatments include angiotensin blockers. Hypertensive end-organ damage includes PVD.     Review of Systems  Constitutional: Negative for chills, fatigue, fever and unexpected weight change.  HENT: Negative for dental problem, mouth sores and trouble swallowing.   Eyes:  Negative for blurred vision and visual disturbance.  Respiratory: Negative for cough, choking, chest tightness, shortness of breath and wheezing.   Cardiovascular: Negative for chest pain, palpitations and leg swelling.  Gastrointestinal: Negative for abdominal distention, abdominal pain, constipation, diarrhea, nausea and vomiting.  Endocrine: Negative for polydipsia, polyphagia and polyuria.  Genitourinary: Negative for dysuria, flank pain, hematuria and urgency.  Musculoskeletal: Negative for back pain, gait problem, myalgias and neck pain.  Skin: Negative for pallor, rash and wound.  Neurological: Negative for seizures, syncope, weakness, numbness and headaches.   Psychiatric/Behavioral: Negative.  Negative for confusion and dysphoric mood.    Objective:    BP 102/71   Pulse 76   Ht 5\' 10"  (1.778 m)   Wt (!) 390 lb (176.9 kg)   BMI 55.96 kg/m   Wt Readings from Last 3 Encounters:  10/25/19 (!) 390 lb (176.9 kg)  10/25/19 (!) 390 lb (176.9 kg)  10/24/19 (!) 383 lb 14.4 oz (174.1 kg)    Physical Exam  Constitutional: He is oriented to person, place, and time. He is cooperative. No distress.  Morbidly obese, in no acute distress  HENT:  Head: Normocephalic and atraumatic.  Eyes: EOM are normal.  Neck: No tracheal deviation present. No thyromegaly present.  Cardiovascular: Normal rate, S1 normal and S2 normal. Exam reveals no gallop.  No murmur heard. Pulses:      Dorsalis pedis pulses are 1+ on the right side and 1+ on the left side.       Posterior tibial pulses are 1+ on the right side and 1+ on the left side.  Pulmonary/Chest: Effort normal. No respiratory distress. He has no wheezes.  Abdominal: He exhibits no distension. There is no abdominal tenderness. There is no guarding and no CVA tenderness.  Musculoskeletal:        General: No edema.     Right shoulder: No swelling or deformity.     Cervical back: Normal range of motion and neck supple.     Comments: Left foot in a cast after partial amputation as a complication of peripheral arterial disease.  Neurological: He is alert and oriented to person, place, and time. He has normal strength. No cranial nerve deficit or sensory deficit. Gait normal.  Skin: Skin is warm and dry. No rash noted. No cyanosis. Nails show no clubbing.  Psychiatric: He has a normal mood and affect. His speech is normal. Cognition and memory are normal.   CMP ( most recent) CMP     Component Value Date/Time   NA 135 10/24/2019 0927   NA 134 09/10/2019 1231   K 4.9 10/24/2019 0927   CL 101 10/24/2019 0927   CO2 27 10/24/2019 0927   GLUCOSE 173 (H) 10/24/2019 0927   BUN 13 10/24/2019 0927   BUN 12  09/10/2019 1231   CREATININE 0.82 10/24/2019 0927   CREATININE 0.77 01/07/2017 1040   CALCIUM 9.4 10/24/2019 0927   PROT 7.5 10/22/2019 0900   ALBUMIN 3.9 10/22/2019 0900   AST 18 10/22/2019 0900   ALT 23 10/22/2019 0900   ALKPHOS 45 10/22/2019 0900   BILITOT 1.2 10/22/2019 0900   GFRNONAA >60 10/24/2019 0927   GFRNONAA >89 04/08/2016 0903   GFRAA >60 10/24/2019 0927   GFRAA >89 04/08/2016 0903     Diabetic Labs (most recent): Lab Results  Component Value Date   HGBA1C 7.4 (H) 10/24/2019   HGBA1C 11.5 (H) 06/28/2019   HGBA1C 12.0 (H) 06/11/2019     Lipid Panel ( most  recent) Lipid Panel     Component Value Date/Time   CHOL 160 10/22/2019 0900   TRIG 119 10/22/2019 0900   HDL 38 (L) 10/22/2019 0900   CHOLHDL 4.2 10/22/2019 0900   VLDL 24 10/22/2019 0900   LDLCALC 98 10/22/2019 0900      Assessment & Plan:   1. Uncontrolled type 2 diabetes mellitus with complication, unspecified whether long term insulin use (HCC)  - Patient has currently uncontrolled symptomatic type 2 DM since  44 years of age. -He returns with significantly improved glycemic profile both fasting and postprandial to target levels. His previsit labs show a1c improving to 7.4% from  11.5%.     No documented or reported hypoglycemia.   -He is recent labs are reviewed with him.    His diabetes is recently complicated by peripheral arterial disease with partial amputation of left foot, obesity/sedentary life, noncompliance/nonadherence, lack of proper insurance for medications and doctors visits and patient remains at a high risk for more acute and chronic complications of diabetes which include CAD, CVA, CKD, retinopathy, and neuropathy. These are all discussed in detail with the patient.  - I have counseled the patient on diet management and weight loss, by adopting a carbohydrate restricted/protein rich diet.  - he  admits there is a room for improvement in his diet and drink choices. -  Suggestion  is made for him to avoid simple carbohydrates  from his diet including Cakes, Sweet Desserts / Pastries, Ice Cream, Soda (diet and regular), Sweet Tea, Candies, Chips, Cookies, Sweet Pastries,  Store Bought Juices, Alcohol in Excess of  1-2 drinks a day, Artificial Sweeteners, Coffee Creamer, and "Sugar-free" Products. This will help patient to have stable blood glucose profile and potentially avoid unintended weight gain.   - I encouraged the patient to switch to  unprocessed or minimally processed complex starch and increased protein intake (animal or plant source), fruits, and vegetables.  - Patient is advised to stick to a routine mealtimes to eat 3 meals  a day and avoid unnecessary snacks ( to snack only to correct hypoglycemia).  - The patient has been  scheduled with Norm Salt, RDN, CDE for individualized DM education.  - I have approached patient with the following individualized plan to manage diabetes and patient agrees:   -Given his significant response, he will benefit from simplified treatment options. -  He is advised to increase his Lantus to 40 units nightly,  Hold  NovoLog for now,  associated with strict monitoring of glucose 2 times a day-before BKF and at bedtime.  -He is warned not to take insulin without proper monitoring of blood glucose per orders.    -Adjustment parameters are given for hypo and hyperglycemia in writing. -Patient is encouraged to call clinic for blood glucose levels less than 70 or above 300 mg /dl. - He is advised to continue Janumet 50/500 mg p.o. 2 times daily after supper and after breakfast and after supper as long as he tolerates.   - Patient specific target  A1c;  LDL, HDL, Triglycerides, were discussed in detail.  2) BP/HTN: His blood pressure is controlled to target.   He is advised to continue metoprolol 50 mg p.o. daily, lisinopril 20 mg p.o. daily.   3) Lipids/HPL:   His recent lipid panel showed controlled LDL at 98 .  He is advised to  continue atorvastatin 20 mg p.o. nightly.      Side effects and precautions discussed with him.   4)  Weight/Diet: His BMI is 55-clearly complicating his diabetes care.  He is a candidate for major weight loss.  I discussed with him the fact that loss of 5-10 percent of his body weight will impact his diabetes significantly.  He is a candidate for bariatric surgery, however he is currently being evaluated for aortic valve replacement.  We will explore this option later.  CDE Consult has been initiated;  exercise, and detailed carbohydrates information provided.  5) Chronic Care/Health Maintenance:  -Patient is on ACEI/ARB and Statin medications and encouraged to continue to follow up with Ophthalmology, Podiatrist at least yearly or according to recommendations, and advised to   stay away from smoking. I have recommended yearly flu vaccine and pneumonia vaccination at least every 5 years; moderate intensity exercise for up to 150 minutes weekly; and  sleep for at least 7 hours a day.  - I advised patient to maintain close follow up with Jacquelin Hawking, PA-C for primary care needs.  - Time spent on this patient care encounter:  35 min, of which > 50% was spent in  counseling and the rest reviewing his blood glucose logs , discussing his hypoglycemia and hyperglycemia episodes, reviewing his current and  previous labs / studies  ( including abstraction from other facilities) and medications  doses and developing a  long term treatment plan and documenting his care.   Please refer to Patient Instructions for Blood Glucose Monitoring and Insulin/Medications Dosing Guide"  in media tab for additional information. Please  also refer to " Patient Self Inventory" in the Media  tab for reviewed elements of pertinent patient history.  [name] participated in the discussions, expressed understanding, and voiced agreement with the above plans.  All questions were answered to his satisfaction. he is  encouraged to contact clinic should he have any questions or concerns prior to his return visit.  Follow up plan: - Return in about 3 months (around 01/23/2020) for Bring Meter and Logs- A1c in Office.  Marquis Lunch, MD Phone: [phone]  Fax: [phone]   10/25/2019, 12:10 PM

## 2019-10-25 NOTE — Patient Instructions (Signed)
Goals  Eat meals on time Increase water intake Cut down on fast foods. Eat soft foods after dental surgery.

## 2019-10-26 ENCOUNTER — Encounter (HOSPITAL_COMMUNITY)
Admission: RE | Disposition: A | Discharge status: Home / Self Care | Payer: Self-pay | Source: Home / Self Care | Attending: Dentistry

## 2019-10-26 ENCOUNTER — Other Ambulatory Visit: Payer: Self-pay

## 2019-10-26 ENCOUNTER — Ambulatory Visit (HOSPITAL_COMMUNITY)
Admission: RE | Admit: 2019-10-26 | Discharge: 2019-10-26 | Disposition: A | Discharge status: Home / Self Care | Payer: Self-pay | Attending: Dentistry | Admitting: Dentistry

## 2019-10-26 ENCOUNTER — Encounter (HOSPITAL_COMMUNITY): Payer: Self-pay | Admitting: Dentistry

## 2019-10-26 ENCOUNTER — Ambulatory Visit (HOSPITAL_COMMUNITY): Payer: Self-pay | Admitting: Anesthesiology

## 2019-10-26 ENCOUNTER — Ambulatory Visit (HOSPITAL_COMMUNITY): Payer: Self-pay | Admitting: Vascular Surgery

## 2019-10-26 DIAGNOSIS — Z20822 Contact with and (suspected) exposure to covid-19: Secondary | ICD-10-CM | POA: Insufficient documentation

## 2019-10-26 DIAGNOSIS — K0530 Chronic periodontitis, unspecified: Secondary | ICD-10-CM

## 2019-10-26 DIAGNOSIS — E785 Hyperlipidemia, unspecified: Secondary | ICD-10-CM | POA: Insufficient documentation

## 2019-10-26 DIAGNOSIS — I2510 Atherosclerotic heart disease of native coronary artery without angina pectoris: Secondary | ICD-10-CM | POA: Insufficient documentation

## 2019-10-26 DIAGNOSIS — Z7982 Long term (current) use of aspirin: Secondary | ICD-10-CM | POA: Insufficient documentation

## 2019-10-26 DIAGNOSIS — F329 Major depressive disorder, single episode, unspecified: Secondary | ICD-10-CM | POA: Insufficient documentation

## 2019-10-26 DIAGNOSIS — Z79899 Other long term (current) drug therapy: Secondary | ICD-10-CM | POA: Insufficient documentation

## 2019-10-26 DIAGNOSIS — E7800 Pure hypercholesterolemia, unspecified: Secondary | ICD-10-CM | POA: Insufficient documentation

## 2019-10-26 DIAGNOSIS — I272 Pulmonary hypertension, unspecified: Secondary | ICD-10-CM | POA: Insufficient documentation

## 2019-10-26 DIAGNOSIS — K045 Chronic apical periodontitis: Secondary | ICD-10-CM | POA: Insufficient documentation

## 2019-10-26 DIAGNOSIS — E119 Type 2 diabetes mellitus without complications: Secondary | ICD-10-CM | POA: Insufficient documentation

## 2019-10-26 DIAGNOSIS — I700 Atherosclerosis of aorta: Secondary | ICD-10-CM | POA: Insufficient documentation

## 2019-10-26 DIAGNOSIS — K029 Dental caries, unspecified: Secondary | ICD-10-CM

## 2019-10-26 DIAGNOSIS — I350 Nonrheumatic aortic (valve) stenosis: Secondary | ICD-10-CM | POA: Insufficient documentation

## 2019-10-26 DIAGNOSIS — F419 Anxiety disorder, unspecified: Secondary | ICD-10-CM | POA: Insufficient documentation

## 2019-10-26 DIAGNOSIS — K083 Retained dental root: Secondary | ICD-10-CM

## 2019-10-26 DIAGNOSIS — Z8249 Family history of ischemic heart disease and other diseases of the circulatory system: Secondary | ICD-10-CM | POA: Insufficient documentation

## 2019-10-26 DIAGNOSIS — K0889 Other specified disorders of teeth and supporting structures: Secondary | ICD-10-CM | POA: Insufficient documentation

## 2019-10-26 DIAGNOSIS — Z6841 Body Mass Index (BMI) 40.0 and over, adult: Secondary | ICD-10-CM | POA: Insufficient documentation

## 2019-10-26 DIAGNOSIS — Z794 Long term (current) use of insulin: Secondary | ICD-10-CM | POA: Insufficient documentation

## 2019-10-26 DIAGNOSIS — K760 Fatty (change of) liver, not elsewhere classified: Secondary | ICD-10-CM | POA: Insufficient documentation

## 2019-10-26 DIAGNOSIS — Z8349 Family history of other endocrine, nutritional and metabolic diseases: Secondary | ICD-10-CM | POA: Insufficient documentation

## 2019-10-26 HISTORY — PX: MULTIPLE EXTRACTIONS WITH ALVEOLOPLASTY: SHX5342

## 2019-10-26 LAB — GLUCOSE, CAPILLARY
Glucose-Capillary: 160 mg/dL — ABNORMAL HIGH (ref 70–99)
Glucose-Capillary: 203 mg/dL — ABNORMAL HIGH (ref 70–99)

## 2019-10-26 SURGERY — MULTIPLE EXTRACTION WITH ALVEOLOPLASTY
Anesthesia: General

## 2019-10-26 MED ORDER — SUGAMMADEX SODIUM 200 MG/2ML IV SOLN
INTRAVENOUS | Status: DC | PRN
  Administered 2019-10-26: 200 mg via INTRAVENOUS

## 2019-10-26 MED ORDER — ROCURONIUM BROMIDE 10 MG/ML (PF) SYRINGE
PREFILLED_SYRINGE | INTRAVENOUS | Status: AC
  Filled 2019-10-26: qty 10

## 2019-10-26 MED ORDER — PHENYLEPHRINE 40 MCG/ML (10ML) SYRINGE FOR IV PUSH (FOR BLOOD PRESSURE SUPPORT)
PREFILLED_SYRINGE | INTRAVENOUS | Status: AC
  Filled 2019-10-26: qty 10

## 2019-10-26 MED ORDER — DEXAMETHASONE SODIUM PHOSPHATE 10 MG/ML IJ SOLN
INTRAMUSCULAR | Status: AC
  Filled 2019-10-26: qty 1

## 2019-10-26 MED ORDER — LIDOCAINE 2% (20 MG/ML) 5 ML SYRINGE
INTRAMUSCULAR | Status: DC | PRN
  Administered 2019-10-26: 100 mg via INTRAVENOUS

## 2019-10-26 MED ORDER — LIDOCAINE-EPINEPHRINE 2 %-1:100000 IJ SOLN
INTRAMUSCULAR | Status: DC | PRN
  Administered 2019-10-26: 10 mL via INTRADERMAL

## 2019-10-26 MED ORDER — FENTANYL CITRATE (PF) 100 MCG/2ML IJ SOLN: 25.0000 ug | INTRAMUSCULAR | Status: DC | PRN

## 2019-10-26 MED ORDER — CELECOXIB 200 MG PO CAPS: 400.0000 mg | ORAL_CAPSULE | Freq: Once | ORAL | Status: AC

## 2019-10-26 MED ORDER — FENTANYL CITRATE (PF) 250 MCG/5ML IJ SOLN
INTRAMUSCULAR | Status: AC
  Filled 2019-10-26: qty 5

## 2019-10-26 MED ORDER — MIDAZOLAM HCL 2 MG/2ML IJ SOLN
INTRAMUSCULAR | Status: AC
  Filled 2019-10-26: qty 2

## 2019-10-26 MED ORDER — ACETAMINOPHEN 500 MG PO TABS
ORAL_TABLET | ORAL | Status: AC
  Administered 2019-10-26: 1000 mg via ORAL
  Filled 2019-10-26: qty 2

## 2019-10-26 MED ORDER — ONDANSETRON HCL 4 MG/2ML IJ SOLN
INTRAMUSCULAR | Status: DC | PRN
  Administered 2019-10-26: 4 mg via INTRAVENOUS

## 2019-10-26 MED ORDER — PROPOFOL 10 MG/ML IV BOLUS
INTRAVENOUS | Status: AC
  Filled 2019-10-26: qty 20

## 2019-10-26 MED ORDER — CELECOXIB 200 MG PO CAPS
ORAL_CAPSULE | ORAL | Status: AC
  Administered 2019-10-26: 400 mg via ORAL
  Filled 2019-10-26: qty 2

## 2019-10-26 MED ORDER — BUPIVACAINE-EPINEPHRINE (PF) 0.5% -1:200000 IJ SOLN
INTRAMUSCULAR | Status: AC
  Filled 2019-10-26: qty 3.6

## 2019-10-26 MED ORDER — OXYCODONE-ACETAMINOPHEN 5-325 MG PO TABS: ORAL_TABLET | ORAL | 0 refills | Status: DC

## 2019-10-26 MED ORDER — AMINOCAPROIC ACID SOLUTION 5% (50 MG/ML)
10.0000 mL | ORAL | Status: DC
  Administered 2019-10-26: 20 mg via ORAL
  Filled 2019-10-26: qty 10

## 2019-10-26 MED ORDER — ETOMIDATE 2 MG/ML IV SOLN
INTRAVENOUS | Status: DC | PRN
  Administered 2019-10-26: 20 mg via INTRAVENOUS

## 2019-10-26 MED ORDER — MIDAZOLAM HCL 5 MG/5ML IJ SOLN
INTRAMUSCULAR | Status: DC | PRN
  Administered 2019-10-26: 2 mg via INTRAVENOUS

## 2019-10-26 MED ORDER — ONDANSETRON HCL 4 MG/2ML IJ SOLN
INTRAMUSCULAR | Status: AC
  Filled 2019-10-26: qty 2

## 2019-10-26 MED ORDER — SUCCINYLCHOLINE CHLORIDE 20 MG/ML IJ SOLN
INTRAMUSCULAR | Status: DC | PRN
  Administered 2019-10-26: 140 mg via INTRAVENOUS

## 2019-10-26 MED ORDER — THROMBIN (RECOMBINANT) 5000 UNITS EX SOLR
CUTANEOUS | Status: AC
  Filled 2019-10-26: qty 5000

## 2019-10-26 MED ORDER — EPHEDRINE 5 MG/ML INJ
INTRAVENOUS | Status: AC
  Filled 2019-10-26: qty 10

## 2019-10-26 MED ORDER — 0.9 % SODIUM CHLORIDE (POUR BTL) OPTIME
TOPICAL | Status: DC | PRN
  Administered 2019-10-26: 1000 mL

## 2019-10-26 MED ORDER — LIDOCAINE-EPINEPHRINE 2 %-1:100000 IJ SOLN
INTRAMUSCULAR | Status: AC
  Filled 2019-10-26: qty 8.5

## 2019-10-26 MED ORDER — PHENYLEPHRINE 40 MCG/ML (10ML) SYRINGE FOR IV PUSH (FOR BLOOD PRESSURE SUPPORT)
PREFILLED_SYRINGE | INTRAVENOUS | Status: DC | PRN
  Administered 2019-10-26: 80 ug via INTRAVENOUS
  Administered 2019-10-26 (×2): 120 ug via INTRAVENOUS

## 2019-10-26 MED ORDER — OXYMETAZOLINE HCL 0.05 % NA SOLN
NASAL | Status: AC
  Filled 2019-10-26: qty 30

## 2019-10-26 MED ORDER — ACETAMINOPHEN 500 MG PO TABS: 1000.0000 mg | ORAL_TABLET | Freq: Once | ORAL | Status: AC

## 2019-10-26 MED ORDER — LIDOCAINE-EPINEPHRINE 2 %-1:100000 IJ SOLN
INTRAMUSCULAR | Status: AC
  Filled 2019-10-26: qty 1.7

## 2019-10-26 MED ORDER — LACTATED RINGERS IV SOLN: INTRAVENOUS | Status: DC | PRN

## 2019-10-26 MED ORDER — FENTANYL CITRATE (PF) 100 MCG/2ML IJ SOLN
INTRAMUSCULAR | Status: DC | PRN
  Administered 2019-10-26 (×2): 25 ug via INTRAVENOUS
  Administered 2019-10-26: 75 ug via INTRAVENOUS

## 2019-10-26 MED ORDER — PROMETHAZINE HCL 25 MG/ML IJ SOLN: 6.2500 mg | INTRAMUSCULAR | Status: DC | PRN

## 2019-10-26 MED ORDER — BUPIVACAINE-EPINEPHRINE 0.5% -1:200000 IJ SOLN
INTRAMUSCULAR | Status: DC | PRN
  Administered 2019-10-26: 4 mg

## 2019-10-26 MED ORDER — KETOROLAC TROMETHAMINE 30 MG/ML IJ SOLN
INTRAMUSCULAR | Status: AC
  Filled 2019-10-26: qty 1

## 2019-10-26 MED ORDER — LIDOCAINE 2% (20 MG/ML) 5 ML SYRINGE
INTRAMUSCULAR | Status: AC
  Filled 2019-10-26: qty 5

## 2019-10-26 MED ORDER — BUPIVACAINE-EPINEPHRINE (PF) 0.5% -1:200000 IJ SOLN
INTRAMUSCULAR | Status: AC
  Filled 2019-10-26: qty 1.8

## 2019-10-26 MED ORDER — ROCURONIUM BROMIDE 10 MG/ML (PF) SYRINGE
PREFILLED_SYRINGE | INTRAVENOUS | Status: DC | PRN
  Administered 2019-10-26: 70 mg via INTRAVENOUS

## 2019-10-26 MED ORDER — PHENYLEPHRINE HCL-NACL 10-0.9 MG/250ML-% IV SOLN
INTRAVENOUS | Status: DC | PRN
  Administered 2019-10-26: 50 ug/min via INTRAVENOUS

## 2019-10-26 MED FILL — OXYCODONE-ACETAMINOPHEN 5-3: 5-325 | 4 days supply | Qty: 24 | Fill #0

## 2019-10-26 SURGICAL SUPPLY — 40 items
ALCOHOL 70% 16 OZ (MISCELLANEOUS) ×3 IMPLANT
ATTRACTOMAT 16X20 MAGNETIC DRP (DRAPES) ×3 IMPLANT
BANDAGE HEMOSTAT MRDH 4X4 STRL (MISCELLANEOUS) IMPLANT
BLADE SURG 15 STRL LF DISP TIS (BLADE) ×2 IMPLANT
BLADE SURG 15 STRL SS (BLADE) ×4
BNDG HEMOSTAT MRDH 4X4 STRL (MISCELLANEOUS)
COVER SURGICAL LIGHT HANDLE (MISCELLANEOUS) ×3 IMPLANT
COVER WAND RF STERILE (DRAPES) ×3 IMPLANT
GAUZE 4X4 16PLY RFD (DISPOSABLE) ×3 IMPLANT
GAUZE PACKING FOLDED 2  STR (GAUZE/BANDAGES/DRESSINGS) ×2
GAUZE PACKING FOLDED 2 STR (GAUZE/BANDAGES/DRESSINGS) ×1 IMPLANT
GLOVE BIO SURGEON STRL SZ 6.5 (GLOVE) ×2 IMPLANT
GLOVE BIO SURGEONS STRL SZ 6.5 (GLOVE) ×1
GLOVE SURG ORTHO 8.0 STRL STRW (GLOVE) ×3 IMPLANT
GOWN STRL REUS W/ TWL LRG LVL3 (GOWN DISPOSABLE) ×1 IMPLANT
GOWN STRL REUS W/TWL 2XL LVL3 (GOWN DISPOSABLE) ×3 IMPLANT
GOWN STRL REUS W/TWL LRG LVL3 (GOWN DISPOSABLE) ×3
HEMOSTAT SURGICEL 2X14 (HEMOSTASIS) IMPLANT
KIT BASIN OR (CUSTOM PROCEDURE TRAY) ×3 IMPLANT
KIT TURNOVER KIT B (KITS) ×3 IMPLANT
MANIFOLD NEPTUNE II (INSTRUMENTS) ×3 IMPLANT
NEEDLE BLUNT 16X1.5 OR ONLY (NEEDLE) IMPLANT
NEEDLE DENTAL 27 LONG (NEEDLE) IMPLANT
NS IRRIG 1000ML POUR BTL (IV SOLUTION) ×3 IMPLANT
PACK EENT II TURBAN DRAPE (CUSTOM PROCEDURE TRAY) ×3 IMPLANT
PAD ARMBOARD 7.5X6 YLW CONV (MISCELLANEOUS) ×3 IMPLANT
SPONGE SURGIFOAM ABS GEL 100 (HEMOSTASIS) IMPLANT
SPONGE SURGIFOAM ABS GEL 12-7 (HEMOSTASIS) IMPLANT
SPONGE SURGIFOAM ABS GEL SZ50 (HEMOSTASIS) IMPLANT
SUCTION FRAZIER HANDLE 10FR (MISCELLANEOUS) ×2
SUCTION TUBE FRAZIER 10FR DISP (MISCELLANEOUS) ×1 IMPLANT
SUT CHROMIC 3 0 PS 2 (SUTURE) ×6 IMPLANT
SUT CHROMIC 4 0 P 3 18 (SUTURE) IMPLANT
SYR 50ML SLIP (SYRINGE) ×3 IMPLANT
TOWEL GREEN STERILE (TOWEL DISPOSABLE) ×3 IMPLANT
TUBE CONNECTING 12'X1/4 (SUCTIONS) ×1
TUBE CONNECTING 12X1/4 (SUCTIONS) ×2 IMPLANT
WATER STERILE IRR 1000ML POUR (IV SOLUTION) ×3 IMPLANT
WATER TABLETS ICX (MISCELLANEOUS) ×3 IMPLANT
YANKAUER SUCT BULB TIP NO VENT (SUCTIONS) ×3 IMPLANT

## 2019-10-26 NOTE — H&P (Signed)
10/26/2019  Patient:            Adam Long Date of Birth:  [birth year] MRN:                [id number]   BP 118/65   Pulse 74   Temp 98.1 F (36.7 C)   Resp 18   Ht 5\' 10"  (1.778 m)   Wt (!) 176.9 kg   SpO2 100%   BMI 55.96 kg/m    Adam Long is a 44 year old male recently diagnosed with severe aortic stenosis and coronary artery disease patient with anticipated aortic valve replacement and coronary artery bypass graft procedure.  Patient was seen as part of a preheart valve surgery dental protocol.  Patient is now scheduled for extraction remaining teeth with the alveoloplasty in the operating room with general anesthesia.  Patient denies any acute medical or dental changes.  Please see note from Dr. Cornelius Moras dated 10/15/2019 to act as H&P for the dental operating room procedure.  Charlynne Pander, DDS     CARDIOTHORACIC SURGERY CONSULTATION REPORT     Primary Cardiologist is Prentice Docker, MD  PCP is Jacquelin Hawking, PA-C          Chief Complaint    Patient presents with    .   Aortic Stenosis            TAVR eval ...review required studies/procedures...needs dental procedures before surgery          HPI:     Patient is a 44 year old morbidly obese male with history of likely bicuspid aortic valve with aortic stenosis, hypertension, type 2 diabetes mellitus, anxiety and depression who has been referred for surgical consultation to discuss treatment options for management of severe symptomatic aortic stenosis.     Patient states that he was first told that he had a heart murmur when he was hospitalized in September 2020 for cellulitis involving his left foot.  Transthoracic echocardiogram performed at that time revealed likely bicuspid aortic valve with severe aortic stenosis.  Peak velocity across aortic valve measured 4.5 m/s corresponding to mean transvalvular gradient estimated 49.5 mmHg.  No aortic insufficiency was noted and the DVI was  reported 0.24.  Left ventricular systolic function appeared normal with ejection fraction estimated 55 to 60%.  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  Once he completely recovered he was referred to the multidisciplinary heart valve clinic where he has been seen previously by Dr. Excell Seltzer.  Catheterization revealed mild nonobstructive coronary artery disease with exception of moderate stenosis of the mid left anterior descending coronary artery.  There was severe pulmonary hypertension.  CT angiography has been performed and the patient was referred for surgical consultation.     Patient is single and lives alone near [facility].  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  He states that he is limited physically by significant exertional shortness of breath.  He gets short of breath with moderate level activity.  He denies resting shortness of breath, PND, palpitations, dizzy spells, or syncope.  He has not experienced exertional chest discomfort.  He states that he cannot lay flat in bed and he suspects that he may have sleep apnea.          Past Medical History:    Diagnosis   Date    .   Anxiety        .  Cholelithiasis        .   Depression        .   Diabetes mellitus        .   Hypercholesteremia        .   Hypertension        .   Morbid obesity (HCC)                    Past Surgical History:    Procedure   Laterality   Date    .   ABDOMINAL AORTOGRAM W/LOWER EXTREMITY   N/A   07/06/2019        Procedure: ABDOMINAL AORTOGRAM W/LOWER EXTREMITY;  Surgeon: Sherren Kerns, MD;  Location: [facility] INVASIVE CV LAB;  Service: Cardiovascular;  Laterality: N/A;  Bilateral    .   AMPUTATION   Left   07/09/2019        Procedure: LEFT FOURTH and Fifth TOE AMPUTATION.;   Surgeon: Larina Earthly, MD;  Location: [facility] OR;  Service: Vascular;  Laterality: Left;    .   IR RADIOLOGY PERIPHERAL GUIDED IV START       10/05/2019    .   IR US GUIDE VASC ACCESS RIGHT       10/05/2019    .   RIGHT HEART CATH AND CORONARY ANGIOGRAPHY   N/A   09/24/2019        Procedure: RIGHT HEART CATH AND CORONARY ANGIOGRAPHY;  Surgeon: Tonny Bollman, MD;  Location: [facility] INVASIVE CV LAB;  Service: Cardiovascular;  Laterality: N/A;                Family History    Problem   Relation   Age of Onset    .   Cancer   Mother                brain    .   Heart disease   Father        .   Hyperlipidemia   Father        .   Hypertension   Father        .   Stroke   Father        .   Heart murmur   Sister               Social History             Socioeconomic History    .   Marital status:   Single            Spouse name:   Not on file    .   Number of children:   Not on file    .   Years of education:   Not on file    .   Highest education level:   Not on file    Occupational History    .   Not on file    Tobacco Use    .   Smoking status:   Never Smoker    .   Smokeless tobacco:   Former Neurosurgeon            Types:   Chew    Substance and Sexual Activity    .   Alcohol use:   Yes            Comment: occasionally    .   Drug use:   No    .  Sexual activity:   Not on file    Other Topics   Concern    .   Not on file    Social History Narrative    .   Not on file        Social Determinants of Health           Financial Resource Strain:     .   Difficulty of Paying Living Expenses: Not on file    Food Insecurity:     .   Worried About Programme researcher, broadcasting/film/video in the Last Year: Not on file    .   Ran Out of Food in the Last Year: Not on file    Transportation Needs:      .   Lack of Transportation (Medical): Not on file    .   Lack of Transportation (Non-Medical): Not on file    Physical Activity:     .   Days of Exercise per Week: Not on file    .   Minutes of Exercise per Session: Not on file    Stress:     .   Feeling of Stress : Not on file    Social Connections:     .   Frequency of Communication with Friends and Family: Not on file    .   Frequency of Social Gatherings with Friends and Family: Not on file    .   Attends Religious Services: Not on file    .   Active Member of Clubs or Organizations: Not on file    .   Attends Banker Meetings: Not on file    .   Marital Status: Not on file    Intimate Partner Violence:     .   Fear of Current or Ex-Partner: Not on file    .   Emotionally Abused: Not on file    .   Physically Abused: Not on file    .   Sexually Abused: Not on file                 Current Outpatient Medications    Medication   Sig   Dispense   Refill    .   aspirin EC 81 MG tablet   Take 81 mg by mouth daily.            Marland Kitchen   atorvastatin (LIPITOR) 20 MG tablet   Take 1 tablet (20 mg total) by mouth daily.   90 tablet   0    .   insulin aspart (NOVOLOG FLEXPEN) 100 UNIT/ML FlexPen   Inject 6-12 Units into the skin 3 (three) times daily before meals. (Patient taking differently: Inject 6-12 Units into the skin 3 (three) times daily before meals. Sliding scale)   5 pen   2    .   Insulin Glargine (LANTUS SOLOSTAR) 100 UNIT/ML Solostar Pen   Inject 20 Units into the skin at bedtime. (Patient taking differently: Inject 30 Units into the skin at bedtime. )   5 pen   2    .   lisinopril (ZESTRIL) 20 MG tablet   Take 1 tablet (20 mg total) by mouth daily.   90 tablet   0    .   metoprolol tartrate (LOPRESSOR) 50 MG tablet   Take 1 tablet (50 mg total) by mouth 2 (two) times daily.   180  tablet   0    .  sitaGLIPtin-metformin (JANUMET) 50-500 MG tablet   Take 1 tablet by mouth 2 (two) times daily with a meal.   180 tablet   0        No current facility-administered medications for this visit.                Allergies    Allergen   Reactions    .   Pollen Extract   Other (See Comments)            Runny nose, watery eyes, sneezing                Review of Systems:                 General:                      normal appetite, decreased energy, no weight gain, no weight loss, no fever              Cardiac:                       no chest pain with exertion, no chest pain at rest, +SOB with exertion, no resting SOB, no PND, + orthopnea, no palpitations, no arrhythmia, no atrial fibrillation, no LE edema, no dizzy spells, no syncope              Respiratory:                 + shortness of breath, no home oxygen, no productive cough, no dry cough, no bronchitis, no wheezing, no hemoptysis, no asthma, no pain with inspiration or cough, + suspected sleep apnea but never tested, no CPAP at night              GI:                               no difficulty swallowing, no reflux, no frequent heartburn, no hiatal hernia, no abdominal pain, no constipation, no diarrhea, no hematochezia, no hematemesis, no melena              GU:                              no dysuria,  no frequency, no urinary tract infection, no hematuria, no enlarged prostate, no kidney stones, no kidney disease              Vascular:                     no pain suggestive of claudication, no pain in feet, no leg cramps, no varicose veins, no DVT, no non-healing foot ulcer              Neuro:                         no stroke, no TIA's, no seizures, no headaches, no temporary blindness one eye,  no slurred speech, no peripheral neuropathy, no chronic pain, no instability of gait, no memory/cognitive dysfunction              Musculoskeletal:         no arthritis, no  joint swelling, no myalgias, no difficulty walking, normal mobility  Skin:                            no rash, no itching, no skin infections, no pressure sores or ulcerations              Psych:                         + anxiety, + depression, + nervousness, + unusual recent stress              Eyes:                           no blurry vision, no floaters, no recent vision changes, + wears glasses or contacts              ENT:                            no hearing loss, no loose or painful teeth, no dentures, last saw dentist recently - needs dental extraction              Hematologic:               no easy bruising, no abnormal bleeding, no clotting disorder, no frequent epistaxis              Endocrine:                   + diabetes, does  check CBG's at home                                                                Physical Exam:                 BP 121/77 (BP Location: Left Arm, Patient Position: Sitting, Cuff Size: Large)   Pulse 83   Temp (!) 97.3 F (36.3 C)   Resp 16   Ht 5\' 10"  (1.778 m)   Wt (!) 372 lb (168.7 kg)   SpO2 98% Comment: RA  BMI 53.38 kg/m               General:                      Morbidly obese male NAD               HEENT:                       Unremarkable               Neck:                           no JVD, no bruits, no adenopathy               Chest:                          clear to auscultation, symmetrical breath sounds, no wheezes, no rhonchi  CV:                              RRR, grade III/VI crescendo/decrescendo murmur heard best at RSB,  no diastolic murmur              Abdomen:                    soft, non-tender, no masses               Extremities:                 warm, well-perfused, pulses diminished, no LE edema              Rectal/GU                   Deferred              Neuro:                         Grossly non-focal and symmetrical throughout              Skin:                             Clean and dry, no rashes, no breakdown        Diagnostic Tests:       ECHOCARDIOGRAM REPORT             Patient Name:   Adam Long Date of Exam: 06/12/2019  Medical Rec #:  [id number]       Height:       71.0 in  Accession #:    [id number]      Weight:       377.9 lb  Date of Birth:  May 29, 1976       BSA:          2.76 m  Patient Age:    43 years        BP:           123/69 mmHg  Patient Gender: M               HR:           91 bpm.  [facility]        Procedure: 2D Echo, Cardiac Doppler and Color Doppler     Indications:    Syncope 780.2 / R55     History:        Patient has no prior history of Echocardiogram examinations.                  Risk Factors: Hypertension, Diabetes and Dyslipidemia. Morbid                  obesity.     Sonographer:    Celesta GentileBernard White RCS  Referring Phys: [phone] COURAGE EMOKPAE     IMPRESSIONS         1. The left ventricle has normal systolic function, with an ejection fraction of 55-60%. The cavity size was normal. There is mildly increased left ventricular wall thickness. Left ventricular diastolic Doppler parameters are consistent with   pseudonormalization. Elevated mean left atrial pressure.   2. The right ventricle has normal systolic function. The cavity was normal. There is no increase in right ventricular wall thickness.  3. The aortic valve has an indeterminate number of cusps. Severely thickening of the aortic valve. Severe calcifcation of the aortic valve. Severe stenosis of the aortic valve. Severe aortic annular calcification noted.   4. The mitral valve is abnormal. Moderate thickening of the mitral valve leaflet. Moderate calcification of the mitral valve leaflet. There is moderate mitral annular calcification present. No evidence of mitral valve stenosis.   5. The aorta is normal unless otherwise noted.   6. The aortic root is normal in size and structure.   7.  Pulmonary hypertension is indeterminant, inadequate TR jet.   8. The interatrial septum was not well visualized.     FINDINGS   Left Ventricle: The left ventricle has normal systolic function, with an ejection fraction of 55-60%. The cavity size was normal. There is mildly increased left ventricular wall thickness. Left ventricular diastolic Doppler parameters are consistent   with pseudonormalization. Elevated mean left atrial pressure Definity contrast agent was given IV to delineate the left ventricular endocardial borders.     Right Ventricle: The right ventricle has normal systolic function. The cavity was normal. There is no increase in right ventricular wall thickness.     Left Atrium: Left atrial size was normal in size.     Right Atrium: Right atrial size was normal in size. Right atrial pressure is estimated at 10 mmHg.     Interatrial Septum: The interatrial septum was not well visualized.     Pericardium: There is no evidence of pericardial effusion.     Mitral Valve: The mitral valve is abnormal. Moderate thickening of the mitral valve leaflet. Moderate calcification of the mitral valve leaflet. There is moderate mitral annular calcification present. Mitral valve regurgitation is not visualized by color   flow Doppler. No evidence of mitral valve stenosis.     Tricuspid Valve: The tricuspid valve is normal in structure. Tricuspid valve regurgitation was not visualized by color flow Doppler.     Aortic Valve: The aortic valve has an indeterminate number of cusps Severely thickening of the aortic valve. Severe calcifcation of the aortic valve. Aortic valve regurgitation was not visualized by color flow Doppler. There is Severe stenosis of the   aortic valve, with a calculated valve area of 1.01 cm. Severe aortic annular calcification noted.     Pulmonic Valve: The pulmonic valve was not well visualized. Pulmonic valve regurgitation is not visualized by  color flow Doppler. No evidence of pulmonic stenosis.     Aorta: The aortic root is normal in size and structure. The aorta is normal unless otherwise noted.     Pulmonary Artery: Pulmonary hypertension is indeterminant, inadequate TR jet.     Venous: The inferior vena cava was not well visualized.        +--------------+--------++  LEFT VENTRICLE         +----------------+---------++  +--------------+--------++ Diastology                 PLAX 2D                +----------------+---------++  +--------------+--------++ LV e' lateral:  8.38 cm/s  LVIDd:        5.58 cm  +----------------+---------++  +--------------+--------++ LV E/e' lateral:15.5       LVIDs:        3.92 cm  +----------------+---------++  +--------------+--------++ LV e' medial:   7.07 cm/s  LV PW:        1.16 cm  +----------------+---------++  +--------------+--------++ LV E/e' medial: 18.4  LV IVS:       1.18 cm  +----------------+---------++  +--------------+--------++  LVOT diam:    2.30 cm   +--------------+--------++  LV SV:        86 ml     +--------------+--------++  LV SV Index:  28.33     +--------------+--------++  LVOT Area:    4.15 cm  +--------------+--------++                          +--------------+--------++     +---------------+----------++  RIGHT VENTRICLE            +---------------+----------++  RV S prime:    11.70 cm/s  +---------------+----------++  TAPSE (M-mode):2.2 cm      +---------------+----------++     +---------------+-------++-----------++  LEFT ATRIUM           Index        +---------------+-------++-----------++  LA diam:       4.40 cm1.59 cm/m   +---------------+-------++-----------++  LA Vol (A2C):  72.2 ml26.13 ml/m  +---------------+-------++-----------++  LA Vol (A4C):  51.5 ml18.64  ml/m  +---------------+-------++-----------++  LA Biplane Vol:62.2 ml22.51 ml/m  +---------------+-------++-----------++  +------------+---------++-----------++  RIGHT ATRIUM         Index        +------------+---------++-----------++  RA Area:    15.60 cm             +------------+---------++-----------++  RA Volume:  39.00 ml 14.11 ml/m  +------------+---------++-----------++   +------------------+------------++  AORTIC VALVE                    +------------------+------------++  AV Area (Vmax):   0.92 cm      +------------------+------------++  AV Area (Vmean):  0.80 cm      +------------------+------------++  AV Area (VTI):    1.01 cm      +------------------+------------++  AV Vmax:          451.50 cm/s   +------------------+------------++  AV Vmean:         323.500 cm/s  +------------------+------------++  AV VTI:           1.017 m       +------------------+------------++  AV Peak Grad:     81.5 mmHg     +------------------+------------++  AV Mean Grad:     49.5 mmHg     +------------------+------------++  LVOT Vmax:        100.00 cm/s   +------------------+------------++  LVOT Vmean:       62.500 cm/s   +------------------+------------++  LVOT VTI:         0.248 m       +------------------+------------++  LVOT/AV VTI ratio:0.24          +------------------+------------++     +-------------+-------++  AORTA                 +-------------+-------++  Ao Root diam:3.20 cm  +-------------+-------++     +--------------+----------++  MITRAL VALVE              +--------------+-------+  +--------------+----------++  SHUNTS                 MV Area (PHT):4.80 cm    +--------------+-------+  +--------------+----------++  Systemic VTI: 0.25 m   MV PHT:       45.82 msec   +--------------+-------+  +--------------+----------++  Systemic Diam:2.30 cm  MV Decel Time:158 msec    +--------------+-------+  +--------------+----------++  +--------------+-----------++  MV E velocity:130.00 cm/s  +--------------+-----------++  MV  A velocity:93.10 cm/s   +--------------+-----------++  MV E/A ratio: 1.40         +--------------+-----------++        Adam Rich MD  Electronically signed by Adam Rich MD  Signature Date/Time: 06/12/2019/4:46:33 PM             RIGHT HEART CATH AND CORONARY ANGIOGRAPHY    Conclusion       1.  Calcified coronary arteries with moderate mid LAD stenosis, otherwise mild nonobstructive disease  2.  Known severe aortic stenosis by echo assessment with heavy calcification and restriction of the aortic valve leaflets as assessed by plain fluoroscopy  3.  Severe pulmonary hypertension with mean PA pressure 51 mmHg, at least in part to left heart disease with elevated wedge pressure of 31 mmHg     Recommendations: Ongoing multidisciplinary heart team evaluation for treatment of severe symptomatic aortic stenosis.  Medical therapy for nonobstructive CAD.    Indications       Severe aortic stenosis [I35.0 (ICD-10-CM)]    Procedural Details       Technical Details   INDICATION: Severe aortic stenosis, diastolic heart failure, preoperative study.  PROCEDURAL DETAILS: There was an indwelling IV in a right antecubital vein. Using normal sterile technique, the IV was changed out for a 5 Fr brachial sheath over a 0.018 inch wire. The right wrist was then prepped, draped, and anesthetized with 1% lidocaine. Using the modified Seldinger technique a 5/6 French Slender sheath was placed in the right radial artery. Intra-arterial verapamil was administered through the radial artery sheath. IV heparin was administered after a JR4 catheter was advanced into the central aorta. A  Swan-Ganz catheter was used for the right heart catheterization. Standard protocol was followed for recording of right heart pressures and sampling of oxygen saturations. Fick cardiac output was calculated. Standard Judkins catheters were used for selective coronary angiography.  There is known severe aortic stenosis.  No attempt is made to cross the aortic valve.  There were no immediate procedural complications. The patient was transferred to the post catheterization recovery area for further monitoring.    Estimated blood loss <50 mL.   During this procedure medications were administered to achieve and maintain moderate conscious sedation while the patient's heart rate, blood pressure, and oxygen saturation were continuously monitored and I was present face-to-face 100% of this time.    Medications  (Filter: Administrations occurring from 09/24/19 0717 to 09/24/19 0819)  midazolam (VERSED) injection (mg)  Total dose:  2 mg      Date/Time       Rate/Dose/Volume   Action    09/24/19 0736       2 mg   Given       fentaNYL (SUBLIMAZE) injection (mcg)  Total dose:  25 mcg      Date/Time       Rate/Dose/Volume   Action    09/24/19 0736       25 mcg   Given       lidocaine (PF) (XYLOCAINE) 1 % injection (mL)  Total volume:  6 mL      Date/Time       Rate/Dose/Volume   Action    09/24/19 0744       3 mL   Given    0754       3 mL   Given       Heparin (Porcine) in NaCl 1000-0.9 UT/500ML-% SOLN (mL)  Total volume:  500 mL  Date/Time       Rate/Dose/Volume   Action    09/24/19 0745       500 mL   Given       Radial Cocktail/Verapamil only (mL)  Total volume:  10 mL      Date/Time       Rate/Dose/Volume   Action    09/24/19 0754       10 mL   Given       heparin injection (Units)  Total dose:  7,000 Units      Date/Time       Rate/Dose/Volume    Action    09/24/19 0757       7,000 Units   Given       iohexol (OMNIPAQUE) 350 MG/ML injection (mL)  Total volume:  70 mL      Date/Time       Rate/Dose/Volume   Action    09/24/19 0815       70 mL   Given       Sedation Time       Sedation Time Physician-1: 33 minutes 6 seconds    Contrast         Medication Name   Total Dose    iohexol (OMNIPAQUE) 350 MG/ML injection   70 mL       Radiation/Fluoro       Fluoro time: 6.4 (min)  DAP: 39364 (mGycm2)  Cumulative Air Kerma: 623 (mGy)    Coronary Findings     Diagnostic  Dominance: Right    Left Main    The vessel is mildly calcified. Patent without stenosis. Divides into the LAD and left circumflex.    Left Anterior Descending    There is mild diffuse disease throughout the vessel. The vessel is severely calcified. The LAD has diffuse calcification present. There is no high-grade stenosis. The proximal LAD is heavily calcified but widely patent. The mid LAD after the first diagonal has 40 to 50% stenosis. The distal LAD has diffuse nonobstructive disease with moderate calcification.    Prox LAD to Mid LAD lesion 50% stenosed    Prox LAD to Mid LAD lesion is 50% stenosed. The lesion is moderately calcified. Mild to moderate mid LAD stenosis after the first diagonal branch, estimated at 40 to 50% with moderate calcification.    Left Circumflex    Vessel is small. The vessel exhibits minimal luminal irregularities. The circumflex distribution is relatively small. The first OM branch is small in caliber. There is no significant stenosis throughout the circumflex distribution.    Right Coronary Artery    Vessel is large. There is mild diffuse disease throughout the vessel. The vessel is moderately calcified. The RCA is a large, dominant vessel. The vessel is diffusely calcified. Distally, it divides into the PDA and posterior AV segment which  supplies a single PLA branch. There are no significant stenoses throughout the RCA or its branch vessels.    Intervention     No interventions have been documented.  Right Heart       Right Heart Pressures   Hemodynamic findings consistent with severe pulmonary hypertension. Elevated LV EDP consistent with volume overload.    Coronary Diagrams     Diagnostic  Dominance: Right  untitled image  Intervention     Implants          No implant documentation for this case.    Syngo Images        Show images for CARDIAC CATHETERIZATION  Images on Long Term Storage        Show images for [place], [place] "[name]"      Link to Procedure Log       Procedure Log       Hemo Data             Most Recent Value     Fick Cardiac Output   5.98 L/min    Fick Cardiac Output Index   2.18 (L/min)/BSA    RA A Wave   15 mmHg    RA V Wave   13 mmHg    RA Mean   10 mmHg    RV Systolic Pressure   67 mmHg    RV Diastolic Pressure   8 mmHg    RV EDP   13 mmHg    PA Systolic Pressure   71 mmHg    PA Diastolic Pressure   33 mmHg    PA Mean   51 mmHg    PW A Wave   36 mmHg    PW V Wave   33 mmHg    PW Mean   31 mmHg    AO Systolic Pressure   123 mmHg    AO Diastolic Pressure   85 mmHg    AO Mean   100 mmHg    QP/QS   1.28    TPVR Index   23.36 HRUI    TSVR Index   45.81 HRUI    PVR SVR Ratio   0.22    TPVR/TSVR Ratio   0.51             Cardiac TAVR CT     TECHNIQUE:  The patient was scanned on a [employer]. A 120 kV  retrospective scan was triggered in the descending thoracic aorta at  111 HU's. Gantry rotation speed was 250 msecs and collimation was .6  mm. No beta blockade or nitro were given. The 3D data set was  reconstructed in 5% intervals of the R-R cycle. Systolic and  diastolic phases were analyzed on a  dedicated work station using  MPR, MIP and VRT modes. The patient received 80 cc of contrast.     FINDINGS:  Image quality: Average.     Noise artifact is: Moderate signal-to-noise due to obesity (BMI 55).     Valve Morphology: Bicuspid, Sievers type 0 (non-raphe). The leaflets  are severely calcified with restricted movement.     Aortic valve calcium score: Calcium score protocol not performed.     Aortic annular dimension: Difficult annular assessment due to signal  to noise artifact. Large annular size noted.     Phase assessed: 35%     Annular area: 6.51 cm2     Annular perimeter: 92.2 mm     Max diameter: 31.8 mm     Min diameter: 27.1 mm     Annular and su-bannular calcification: No significant annular  calcification. Mild calcification of the sub-annular area in the  aorta-mitral valve continuity.     Optimal coplanar projection: LAO 6 CRA 11     Coronary Artery Height above Annulus:     Left Main: 13.5 mm     Right Coronary: 18.6 mm     Sinus of Valsalva: Cusp to Cusp (bicuspid), 40 mm     Sinotubular Junction: 29.4 mm     STJ Height: 20.2 mm     Ascending Thoracic Aorta: 44 mm     Coronary arteries: Normal origins. Right dominance.  Severe 3-vessel  calcifications noted. Study performed without NTG and insufficient  for evaluation.     Cardiac Morphology:     Right Atrium: Right atrial size is within normal limits.     Right Ventricle: The right ventricular cavity is within normal  limits.     Left Atrium: Left atrial size is normal in size with no left atrial  appendage filling defect.     Left Ventricle: The ventricular cavity size is within normal limits.  There are no stigmata of prior infarction. There is no abnormal  filling defect. Normal left ventricular function, EF 64%. No wall  motion abnormalities.     Pulmonary arteries: Normal in size without proximal filling  defect.     Pulmonary veins: Normal pulmonary venous drainage. There were 4  noted pulmonary veins, 2 on the right and 2 on the left.     Pericardium: Normal thickness with no significant effusion or  calcium present.     Mitral Valve: The mitral valve is normal structure with mild mitral  annular calcification.     Extra-cardiac findings: See attached radiology report for  non-cardiac structures.     IMPRESSION:  1. Average quality study due to moderate signal- to-noise artifact  due to severe obesity (BMI 55).     2. Bicuspid valve, Sievers type 0 (non-raphe). Severely calcified  with restricted leaflet motion.     3. Annular area 6.51 cm2. Appropriate for 29 mm Edwards Sapien 3  TAVR.     4. Sufficient coronary to annulus distance.     5. Optimum Fluoroscopic Angle for Delivery: LAO 6 CRA 11     6. No thrombus in the left atrial appendage.     7. 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     8. Severe 3-vessel calcifications noted.     Adam OdorWesley O'Neal, MD        Electronically Signed    By: Adam OdorWesley  Long    On: 10/05/2019 18:13           CT ANGIOGRAPHY CHEST, ABDOMEN AND PELVIS     TECHNIQUE:  Multidetector CT imaging through the chest, abdomen and pelvis was  performed using the standard protocol during bolus administration of  intravenous contrast. Multiplanar reconstructed images and MIPs were  obtained and reviewed to evaluate the vascular anatomy.     CONTRAST:  100mL OMNIPAQUE IOHEXOL 350 MG/ML SOLN     COMPARISON:  CT the abdomen and pelvis 01/20/2007.     FINDINGS:  CTA CHEST FINDINGS     Cardiovascular: Heart size is normal. There is no significant  pericardial fluid, thickening or pericardial calcification. There is  aortic atherosclerosis, as well as atherosclerosis of the great  vessels of the mediastinum and the coronary arteries, including  calcified atherosclerotic plaque in the  left main, left anterior  descending, left circumflex and right coronary arteries. Severe  thickening calcification of the aortic valve. Ectasia of ascending  thoracic aorta (4.3 cm in diameter).     Mediastinum/Lymph Nodes: No pathologically enlarged mediastinal or  hilar lymph nodes. Esophagus is unremarkable in appearance. No  axillary lymphadenopathy.     Lungs/Pleura: No suspicious appearing pulmonary nodules or masses  are noted. No acute consolidative airspace disease. No pleural  effusions.     Musculoskeletal/Soft Tissues: There are no aggressive appearing  lytic or blastic lesions noted in the visualized portions of the  skeleton.     CTA ABDOMEN AND PELVIS FINDINGS     Hepatobiliary:  Diffuse low attenuation throughout the hepatic  parenchyma, indicative of hepatic steatosis. No suspicious cystic or  solid hepatic lesions. No intra or extrahepatic biliary ductal  dilatation. Small calcified granuloma in the left lobe of the liver  incidentally noted. Partially calcified gallstones are noted within  the gallbladder. No surrounding inflammatory changes to suggest an  acute cholecystitis at this time.     Pancreas: No pancreatic mass. No pancreatic ductal dilatation. No  pancreatic or peripancreatic fluid collections or inflammatory  changes.     Spleen: Unremarkable.     Adrenals/Urinary Tract: Bilateral kidneys and adrenal glands are  normal in appearance. No hydroureteronephrosis. Urinary bladder is  normal in appearance.     Stomach/Bowel: Normal appearance of the stomach. No pathologic  dilatation of small bowel or colon. The appendix is not confidently  identified and may be surgically absent. Regardless, there are no  inflammatory changes noted adjacent to the cecum to suggest the  presence of an acute appendicitis at this time.     Vascular/Lymphatic: Aortic atherosclerosis, without evidence of  aneurysm or  dissection in the abdominal or pelvic vasculature.  Vascular findings and measurements pertinent to potential TAVR  procedure, as detailed below. No lymphadenopathy noted in the  abdomen or pelvis.     Reproductive: Prostate gland and seminal vesicles are unremarkable  in appearance.     Other: No significant volume of ascites.  No pneumoperitoneum.     Musculoskeletal: There are no aggressive appearing lytic or blastic  lesions noted in the visualized portions of the skeleton.     VASCULAR MEASUREMENTS PERTINENT TO TAVR:     AORTA:     Minimal Aortic Diameter-20 x 18 mm     Severity of Aortic Calcification-mild     RIGHT PELVIS:     Right Common Iliac Artery -     Minimal Diameter-12.8 x 9.7 mm     Tortuosity - mild     Calcification - mild     Right External Iliac Artery -     Minimal Diameter-10.1 x 10.0 mm     Tortuosity - mild     Calcification-none     Right Common Femoral Artery -     Minimal Diameter-9.4 x 9.2 mm     Tortuosity - mild     Calcification-none     LEFT PELVIS:     Left Common Iliac Artery -     Minimal Diameter-13.0 x 11.9 mm     Tortuosity - mild     Calcification - mild     Left External Iliac Artery -     Minimal Diameter-10.1 x 9.8 mm     Tortuosity - mild     Calcification-none     Left Common Femoral Artery -     Minimal Diameter-9.7 x 10.0 mm     Tortuosity-mild     Calcification-none     Review of the MIP images confirms the above findings.     IMPRESSION:  1. Vascular findings and measurements pertinent to potential TAVR  procedure, as detailed above.  2. Severe thickening and calcification of the aortic valve,  compatible with reported clinical history of severe aortic stenosis.  3. Aortic atherosclerosis, in addition to left main and 3 vessel  coronary artery disease. Please note that although the presence of  coronary  artery calcium documents the presence of coronary artery  disease, the severity of this disease and any potential stenosis  cannot be assessed on this non-gated CT examination.  Assessment for  potential risk factor modification, dietary therapy or pharmacologic  therapy may be warranted, if clinically indicated.  4. There is also ectasia of the ascending thoracic aorta (4.3 cm in  diameter).  5. Hepatic steatosis.  6. Cholelithiasis without evidence of acute cholecystitis at this  time.        Electronically Signed    By: Adam Reed M.D.    On: 10/05/2019 11:48        EKG:   NSR w/out significant AV conduction delay           Impression:     Patient has likely bicuspid aortic valve with stage D severe symptomatic aortic stenosis.  He describes stable symptoms of exertional shortness of breath consistent with chronic diastolic congestive heart failure, New [place] Heart Association functional class IIb.  I have personally reviewed the patient's most recent transthoracic echocardiogram, diagnostic cardiac catheterization, twelve-lead EKG, and CT angiograms.  Transthoracic echocardiogram demonstrates severe aortic stenosis with preserved left ventricular systolic function.  Acoustic windows were relatively poor quality but the entire aortic valve is notable for heavily calcified and thickened leaflets with severely restricted leaflet mobility.  Peak velocity across aortic valve measured 4.5 m/s corresponding to mean transvalvular gradient estimated close to 50 mmHg.  Left ventricular systolic function remains normal.  Diagnostic cardiac catheterization reveals nonobstructive coronary artery disease with moderate stenosis in the mid left anterior descending coronary artery that does not appear to be flow-limiting.  There are no other areas of significant plaque identified.  Transvalvular gradient was not assessed but the patient was noted to have severe pulmonary  hypertension.  Cardiac gated CT angiogram of the heart was relatively poor quality and limited because of patient's body habitus.  Precise quantification of the size of the aortic annulus is subsequently challenging, but overall the annulus size is relatively large.  The patient appears to have a Sievers type 0 bicuspid aortic valve and there is heavy calcification involving the leaflets that is somewhat asymmetrical.  There is mild ectasia of the ascending aorta measuring 4.3 cm in its greatest transverse diameter.  EKG reveals sinus rhythm with no significant AV conduction delay.     I agree the patient needs aortic valve replacement.  Risks associated with conventional surgery will be somewhat elevated because of the patient's severe morbid obesity.  However, I would be reluctant to consider transcatheter aortic valve replacement as an alternative to conventional surgery because of the patient's remarkably young age and the presence of a bicuspid aortic valve with severe but asymmetric valvular calcification.     We discussed the timing of patient's planned surgical intervention with reference to the ongoing COVID-19 pandemic.  Whereas the patient's surgery is by no means urgent and could be delayed 1-2 weeks without significant risk, delay of surgery by more than 3 months would likely be associated with considerable clinical risk.         Plan:     The patient was counseled at length regarding treatment alternatives for management of severe aortic stenosis including continued medical therapy versus proceeding with aortic valve replacement in the near future.  The natural history of aortic stenosis was reviewed, as was long term prognosis with medical therapy alone.  Surgical options were discussed at length including conventional surgical aortic valve replacement through either a full median sternotomy or using minimally invasive techniques.  Transcatheter aortic valve replacement was discussed  as a much less invasive alternative to conventional surgery.  Discussion was held  comparing the relative risks of mechanical valve replacement with need for lifelong anticoagulation versus use of a bioprosthetic tissue valve and the associated potential for late structural valve deterioration and failure.       The patient is interested in proceeding with definitive surgical treatment in the near future, although he wants to think about the various options further.  As a next step the patient needs to undergo dental extraction.  He will return to our office within the next 2 to 3 weeks once dental extraction has been completed to make final plans for surgery.  All questions answered.        I spent in excess of 90 minutes during the conduct of this office consultation and >50% of this time involved direct face-to-face encounter with the patient for counseling and/or coordination of their care.              Valentina Gu. Roxy Manns, MD  10/15/2019  1:14 PM                  Electronically signed by Rexene Alberts, MD at 10/15/2019  2:30 PM  Electronically signed by Rexene Alberts, MD at 10/15/2019  2:31 PM

## 2019-10-26 NOTE — Transfer of Care (Signed)
Immediate Anesthesia Transfer of Care Note  Patient: Adam Long  Procedure(s) Performed: EXTRACTION OF TOOTH #'S 3, 5-11,19-28,  AND 32 WITH ALVEOLOPLASTY (N/A )  Patient Location: PACU  Anesthesia Type:General  Level of Consciousness: awake, alert  and oriented  Airway & Oxygen Therapy: Patient Spontanous Breathing and Patient connected to face mask oxygen  Post-op Assessment: Report given to RN, Post -op Vital signs reviewed and stable and Patient moving all extremities X 4  Post vital signs: Reviewed and stable  Last Vitals:  Vitals Value Taken Time  BP 119/81 10/26/19 1005  Temp    Pulse 89 10/26/19 1008  Resp 21 10/26/19 1008  SpO2 90 % 10/26/19 1008  Vitals shown include unvalidated device data.  Last Pain:  Vitals:   10/26/19 0630  PainSc: 0-No pain         Complications: No apparent anesthesia complications

## 2019-10-26 NOTE — Progress Notes (Signed)
PRE-OPERATIVE NOTE:  10/26/2019 YEE JOSS [id number]  COVID 19 SCREENING: The patient does not symptoms concerning for COVID-19 infection (Including fever, chills, cough, or new SHORTNESS OF BREATH).   VITALS: BP 118/65   Pulse 74   Temp 98.1 F (36.7 C)   Resp 18   Ht 5\' 10"  (1.778 m)   Wt (!) 176.9 kg   SpO2 100%   BMI 55.96 kg/m   Lab Results  Component Value Date   WBC 9.1 10/24/2019   HGB 14.0 10/24/2019   HCT 42.6 10/24/2019   MCV 90.3 10/24/2019   PLT 232 10/24/2019   BMET    Component Value Date/Time   NA 135 10/24/2019 0927   NA 134 09/10/2019 1231   K 4.9 10/24/2019 0927   CL 101 10/24/2019 0927   CO2 27 10/24/2019 0927   GLUCOSE 173 (H) 10/24/2019 0927   BUN 13 10/24/2019 0927   BUN 12 09/10/2019 1231   CREATININE 0.82 10/24/2019 0927   CREATININE 0.77 01/07/2017 1040   CALCIUM 9.4 10/24/2019 0927   GFRNONAA >60 10/24/2019 0927   GFRNONAA >89 04/08/2016 0903   GFRAA >60 10/24/2019 0927   GFRAA >89 04/08/2016 0903    Lab Results  Component Value Date   INR 1.1 06/11/2019   No results found for: PTT   06/13/2019 presents for dental procedures in the operating room.   SUBJECTIVE: The patient denies any acute medical or dental changes and agrees to proceed with treatment as planned.  EXAM: No sign of acute dental changes.  ASSESSMENT: Patient is affected by chronic apical periodontitis, multiple retained root segments, dental caries chronic periodontitis, and loose teeth.  PLAN: Patient agrees to proceed with treatment as planned in the operating room as previously discussed and accepts the risks, benefits, and complications of the proposed treatment. Patient is aware of the risk for bleeding, bruising, swelling, infection, pain, nerve damage, tissue damage, sinus involvement, root tip fracture, mandible fracture, and the risks of complications associated with the anesthesia. Patient also is aware of the potential for other  complications to and including death due to his overall cardiovascular compromise.   [name], DDS

## 2019-10-26 NOTE — Op Note (Signed)
OPERATIVE REPORT  Patient:            Adam Long Date of Birth:  [birth year] MRN:                [id number]   DATE OF PROCEDURE:  10/26/2019  PREOPERATIVE DIAGNOSES: 1.  Severe aortic stenosis 2.  Preheart valve surgery dental protocol 3.  Chronic apical periodontitis  4.  Multiple retained root segments 5.  Dental caries 6.  Chronic periodontitis 7.  Loose teeth  POSTOPERATIVE DIAGNOSES: 1.  Severe aortic stenosis 2.  Preheart valve surgery dental protocol 3.  Chronic apical periodontitis  4.  Multiple retained root segments 5.  Dental caries 6.  Chronic periodontitis 7.  Loose teeth  OPERATIONS: 1. Multiple extraction of tooth numbers 3, 5-11, 19-28, and 32 2.  4 quadrants of alveoloplasty   SURGEON: Charlynne Pander, DDS  ASSISTANT: Pearletha Alfred (dental assistant)  ANESTHESIA: General anesthesia via nasoendotracheal tube.  MEDICATIONS: 1. Ancef 3 g IV prior to invasive dental procedures. 2. Local anesthesia with a total utilization of 6 carpules each containing 34 mg of lidocaine with 0.017 mg of epinephrine as well as 2 carpules each containing 9 mg of bupivacaine with 0.009 mg of epinephrine.  SPECIMENS: There are 19 teeth that were discarded.  DRAINS: None  CULTURES: None  COMPLICATIONS: None  ESTIMATED BLOOD LOSS: 150 mLs.  INTRAVENOUS FLUIDS: 700 mLs of Lactated ringers solution.  INDICATIONS: The patient was recently diagnosed with severe aortic stenosis and coronary artery disease.  A medically necessary dental consultation was then requested to evaluate poor dentition.  The patient was examined and treatment planned for extraction of remaining teeth with alveoloplasty in the operating room with general anesthesia.  This treatment plan was formulated to decrease the risks and complications associated with dental infection from affecting the patient's systemic health and the anticipated heart valve surgery.  OPERATIVE FINDINGS: Patient  was examined in operating room number 4.  The teeth were identified for extraction. The patient was noted be affected by chronic apical periodontitis, retained root segments, dental caries, chronic periodontitis, and loose teeth.   DESCRIPTION OF PROCEDURE: Patient was brought to the main operating room number 4. Patient was then placed in the supine position on the operating table. General anesthesia was then induced per the anesthesia team. The patient was then prepped and draped in the usual manner for dental medicine procedure. A timeout was performed. The patient was identified and procedures were verified. A throat pack was placed at this time. The oral cavity was then thoroughly examined with the findings noted above. The patient was then ready for dental medicine procedure as follows:  Local anesthesia was then administered sequentially with a total utilization of 6 carpules each containing 34 mg of lidocaine with 0.017 mg of epinephrine as well as 2 carpules  each containing 9 mg bupivacaine with 0.009 mg of epinephrine.  The Maxillary left and right quadrants first approached. Anesthesia was then delivered utilizing infiltration with lidocaine with epinephrine. A #15 blade incision was then made from the distal of the maxillary right tuberosity and extended to the distal of #13.  A  surgical flap was then carefully reflected. The maxillary teeth were then subluxated with a series of straight elevators. Tooth number 3 was then removed with a 53R forceps without complications.  Tooth numbers 5 through 11 were then removed with a 150 forceps without complications.  Alveoloplasty was then performed utilizing rongeurs and bone file to help achieve  primary closure.  The tissues were approximated and trimmed appropriately.  The surgical site was then irrigated with copious amounts sterile saline.  A piece of Surgifoam was placed in the extraction socket of tooth numbers 3 and 5 through 11.  The surgical  site was then closed from the maxillary right tuberosity and extended to the mesial #8 utilizing 3-0 Chromic Gut suture in a continuous erupted suture technique x1.  Maxillary left surgical site was then closed in the distal #13 extended mesial #9 utilizing 3-0 Chromic Gut suture in a continuous interrupted suture technique x1.  2 individual interrupted sutures were then placed to further close surgical site.  At this point time, the mandibular quadrants were approached. The patient was given bilateral inferior alveolar nerve blocks and long buccal nerve blocks utilizing the bupivacaine with epinephrine. Further infiltration was then achieved utilizing the lidocaine with epinephrine. A 15 blade incision was then made from the distal of 18 and extended to the distal of #29.  Second surgical incision was made from the distal #32 and extended the mesial #30.  Surgical flaps were careully reflected. The lower teeth were then subluxated with a series of straight elevators.  The retained roots and teeth numbers 19 through 28 and 32 were then removed with a 151 forceps or a rongeurs as needed.  Alveoloplasty was then performed utilizing rongeurs and bone file to help achieve primary closure.  The tissues were approximated and trimmed appropriately.  The surgical sites were irrigated with copious amounts sterile saline.  A piece of Surgifoam was placed in the extraction sockets of tooth numbers 19 through 28.  The mandibular left surgical site was then closed from the distal of #17 extended to the mesial #24 utilizing 3-0 Chromic Gut suture in a continuous erupted suture technique x1.  The mandibular right surgical site was then closed from the distal #32 and extended the mesial #30 utilizing 3-0 Chromic Gut suture in a continuous erupted suture technique x1.  The mandibular right surgical site was then further closed from the distal of #29 extended the mesial #25 utilizing 3-0 Chromic Gut suture in a continuous erupted  suture technique x1.    At this point time, the entire mouth was irrigated with copious amounts of sterile saline. The patient was examined for complications, seeing none, the dental medicine procedure was deemed to be complete. The throat pack was removed at this time. An oral airway was then placed at the request of the anesthesia team. A series of 4 x 4 gauze moistened with Amicar 5% rinse were placed in the mouth to aid hemostasis. The patient was then handed over to the anesthesia team for final disposition. After an appropriate amount of time, the patient was extubated and taken to the postanesthsia care unit in good condition. All counts were correct for the dental medicine procedure.  The patient is to continue Amicar 5% rinse postoperatively.  Patient is to rinse with 10 mls every hour for the next 10 hours in a swish and spit manner.   Lenn Cal, DDS.

## 2019-10-26 NOTE — Anesthesia Procedure Notes (Signed)
Procedure Name: Intubation Date/Time: 10/26/2019 7:43 AM Performed by: Kyung Rudd, CRNA Pre-anesthesia Checklist: Patient identified, Emergency Drugs available, Suction available and Patient being monitored Patient Re-evaluated:Patient Re-evaluated prior to induction Oxygen Delivery Method: Circle system utilized Preoxygenation: Pre-oxygenation with 100% oxygen Induction Type: IV induction and Rapid sequence Laryngoscope Size: Mac and 4 Grade View: Grade I Nasal Tubes: Nasal prep performed, Nasal Rae and Magill forceps- large, utilized Tube size: 7.5 mm Number of attempts: 1 Placement Confirmation: ETT inserted through vocal cords under direct vision,  positive ETCO2 and breath sounds checked- equal and bilateral Tube secured with: Tape Dental Injury: Teeth and Oropharynx as per pre-operative assessment

## 2019-10-26 NOTE — Discharge Instructions (Signed)
MOUTH CARE AFTER SURGERY ° °FACTS: °· Ice used in ice bag helps keep the swelling down, and can help lessen the pain. °· It is easier to treat pain BEFORE it happens. °· Spitting disturbs the clot and may cause bleeding to start again, or to get worse. °· Smoking delays healing and can cause complications. °· Sharing prescriptions can be dangerous.  Do not take medications not recently prescribed for you. °· Antibiotics may stop birth control pills from working.  Use other means of birth control while on antibiotics. °· Warm salt water rinses after the first 24 hours will help lessen the swelling:  Use 1/2 teaspoonful of table salt per oz.of water. ° °DO NOT: °· Do not spit.  Do not drink through a straw. °· Strongly advised not to smoke, dip snuff or chew tobacco at least for 3 days. °· Do not eat sharp or crunchy foods.  Avoid the area of surgery when chewing. °· Do not stop your antibiotics before your instructions say to do so. °· Do not eat hot foods until bleeding has stopped.  If you need to, let your food cool down to room temperature. ° °EXPECT: °· Some swelling, especially first 2-3 days. °· Soreness or discomfort in varying degrees.  Follow your dentist's instructions about how to handle pain before it starts. °· Pinkish saliva or light blood in saliva, or on your pillow in the morning.  This can last around 24 hours. °· Bruising inside or outside the mouth.  This may not show up until 2-3 days after surgery.  Don't worry, it will go away in time. °· Pieces of "bone" may work themselves loose.  It's OK.  If they bother you, let us know. ° °WHAT TO DO IMMEDIATELY AFTER SURGERY: °· Bite on the gauze with steady pressure for 1-2 hours.  Don't chew on the gauze. °· Do not lie down flat.  Raise your head support especially for the first 24 hours. °· Apply ice to your face on the side of the surgery.  You may apply it 20 minutes on and a few minutes off.  Ice for 8-12 hours.  You may use ice up to 24  hours. °· Before the numbness wears off, take a pain pill as instructed. °· Prescription pain medication is not always required. ° °SWELLING: °· Expect swelling for the first couple of days.  It should get better after that. °· If swelling increases 3 days or so after surgery; let us know as soon as possible. ° °FEVER: °· Take Tylenol every 4 hours if needed to lower your temperature, especially if it is at 100F or higher. °· Drink lots of fluids. °· If the fever does not go away, let us know. ° °BREATHING TROUBLE: °· Any unusual difficulty breathing means you have to have someone bring you to the emergency room ASAP ° °BLEEDING: °· Light oozing is expected for 24 hours or so. °· Prop head up with pillows °· Avoid spitting °· Do not confuse bright red fresh flowing blood with lots of saliva colored with a little bit of blood. °· If you notice some bleeding, place gauze or a tea bag where it is bleeding and apply CONSTANT pressure by biting down for 1 hour.  Avoid talking during this time.  Do not remove the gauze or tea bag during this hour to "check" the bleeding. °· If you notice bright RED bleeding FLOWING out of particular area, and filling the floor of your mouth, put   a wad of gauze on that area, bite down firmly and constantly.  Call us immediately.  If we're closed, have someone bring you to the emergency room.  ORAL HYGIENE:  Brush your teeth as usual after meals and before bedtime.  Use a soft toothbrush around the area of surgery.  DO NOT AVOID BRUSHING.  Otherwise bacteria(germs) will grow and may delay healing or encourage infection.  Since you cannot spit, just gently rinse and let the water flow out of your mouth.  DO NOT SWISH HARD.  EATING:  Cool liquids are a good point to start.  Increase to soft foods as tolerated.  PRESCRIPTIONS:  Follow the directions for your prescriptions exactly as written.  If Dr. Kristin Bruins gave you a narcotic pain medication, do not drive, operate  machinery or drink alcohol when on that medication.  QUESTIONS:  Call our office during office hours [phone] or call the Emergency Room at [phone].   Care order/instruction: Patient is to continue the Amicar 5% rinses postoperatively. Patient is to rinse with 10 ML's every hour for 10 hours in a swish and spit manner.

## 2019-10-26 NOTE — Anesthesia Postprocedure Evaluation (Signed)
Anesthesia Post Note  Patient: Adam Long  Procedure(s) Performed: EXTRACTION OF TOOTH #'S 3, 5-11,19-28,  AND 32 WITH ALVEOLOPLASTY (N/A )     Patient location during evaluation: PACU Anesthesia Type: General Level of consciousness: sedated Pain management: pain level controlled Vital Signs Assessment: post-procedure vital signs reviewed and stable Respiratory status: spontaneous breathing and respiratory function stable Cardiovascular status: stable Postop Assessment: no apparent nausea or vomiting Anesthetic complications: no    Last Vitals:  Vitals:   10/26/19 1005 10/26/19 1050  BP: 119/81   Pulse: 89   Resp: (!) 22   Temp: 36.6 C 36.9 C  SpO2: 99%     Last Pain:  Vitals:   10/26/19 1050  PainSc: 0-No pain                 Ndea Kilroy DANIEL

## 2019-10-29 ENCOUNTER — Encounter: Payer: Self-pay | Admitting: Thoracic Surgery (Cardiothoracic Vascular Surgery)

## 2019-11-05 ENCOUNTER — Encounter: Payer: Self-pay | Admitting: Thoracic Surgery (Cardiothoracic Vascular Surgery)

## 2019-11-05 ENCOUNTER — Other Ambulatory Visit: Payer: Self-pay

## 2019-11-05 ENCOUNTER — Other Ambulatory Visit: Payer: Self-pay | Admitting: *Deleted

## 2019-11-05 ENCOUNTER — Ambulatory Visit (HOSPITAL_COMMUNITY): Payer: Self-pay | Admitting: Dentistry

## 2019-11-05 ENCOUNTER — Encounter (HOSPITAL_COMMUNITY): Payer: Self-pay | Admitting: Dentistry

## 2019-11-05 ENCOUNTER — Ambulatory Visit (INDEPENDENT_AMBULATORY_CARE_PROVIDER_SITE_OTHER): Payer: Self-pay | Admitting: Thoracic Surgery (Cardiothoracic Vascular Surgery)

## 2019-11-05 ENCOUNTER — Encounter: Payer: Self-pay | Admitting: *Deleted

## 2019-11-05 VITALS — BP 104/71 | HR 87 | Temp 98.4°F

## 2019-11-05 VITALS — BP 102/69 | HR 98 | Temp 98.1°F | Resp 20 | Ht 70.0 in | Wt 390.0 lb

## 2019-11-05 DIAGNOSIS — Z01818 Encounter for other preprocedural examination: Secondary | ICD-10-CM

## 2019-11-05 DIAGNOSIS — K0820 Unspecified atrophy of edentulous alveolar ridge: Secondary | ICD-10-CM

## 2019-11-05 DIAGNOSIS — K08109 Complete loss of teeth, unspecified cause, unspecified class: Secondary | ICD-10-CM

## 2019-11-05 DIAGNOSIS — I350 Nonrheumatic aortic (valve) stenosis: Secondary | ICD-10-CM

## 2019-11-05 DIAGNOSIS — K08199 Complete loss of teeth due to other specified cause, unspecified class: Secondary | ICD-10-CM

## 2019-11-05 NOTE — Progress Notes (Signed)
[address]       [name] [address]             [phone]     CARDIOTHORACIC SURGERY OFFICE NOTE  Primary Cardiologist is Prentice Docker, MD PCP is Jacquelin Hawking, PA-C   HPI:  Patient is a 44 year old morbidly obese male with history of likely bicuspid aortic valve with aortic stenosis, hypertension, type 2 diabetes mellitus, anxiety and depression who returns to the office today to further discuss treatment options for management of severe symptomatic aortic stenosis.  He was originally seen in consultation on October 15, 2019.  He underwent dental extraction by Dr. Robin Searing on October 26, 2019 and returns to our office for follow-up today.  He reports no new problems or complaints and states that his mouth is healing fairly well.   Current Outpatient Medications  Medication Sig Dispense Refill  . aspirin EC 81 MG tablet Take 81 mg by mouth daily.    Marland Kitchen atorvastatin (LIPITOR) 40 MG tablet Take 1 tablet (40 mg total) by mouth daily. 90 tablet 1  . Insulin Glargine (LANTUS SOLOSTAR) 100 UNIT/ML Solostar Pen Inject 40 Units into the skin at bedtime. 15 mL   . lisinopril (ZESTRIL) 20 MG tablet Take 1 tablet (20 mg total) by mouth daily. 90 tablet 0  . metoprolol tartrate (LOPRESSOR) 50 MG tablet Take 1 tablet (50 mg total) by mouth 2 (two) times daily. 180 tablet 0  . oxyCODONE-acetaminophen (PERCOCET) 5-325 MG tablet Take one tablet by mouth every 4 hours as needed for pain. 24 tablet 0  . sitaGLIPtin-metformin (JANUMET) 50-500 MG tablet Take 1 tablet by mouth 2 (two) times daily with a meal. 180 tablet 0   No current facility-administered medications for this visit.      Physical Exam:   BP 102/69 (BP Location: Right Arm, Patient Position: Sitting, Cuff Size: Large)   Pulse 98   Temp 98.1 F (36.7 C) (Skin)   Resp 20   Ht 5\' 10"  (1.778 m)   Wt (!) 390 lb (176.9 kg)   SpO2 96% Comment: RA  BMI 55.96 kg/m   General:  Morbidly obese but  well-appearing  Chest:   Clear to auscultation  CV:   Regular rate and rhythm with prominent systolic murmur  Incisions:  n/a  Abdomen:  Soft nontender  Extremities:  Warm and well-perfused  Diagnostic Tests:  n/a   Impression:  Patient has bicuspid aortic valve with stage D severe symptomatic aortic stenosis.  He recently underwent dental extraction without complication and currently describes stable symptoms of exertional shortness of breath and fatigue consistent with chronic diastolic congestive heart failure, [place] Heart Association functional class II.   Plan:  I have again reviewed the indications, risk, and potential benefits of aortic valve replacement with the patient in the office today.  He is interested in proceeding with elective surgery in early March.  He has decided that he prefers to have his valve replaced using a mechanical prosthetic valve and he understands the associated need for lifelong anticoagulation using warfarin.  We plan to proceed with surgery on December 05, 2019.  Patient will return to our office for follow-up prior to surgery on December 03, 2019.  All questions answered.  I spent in excess of 10 minutes during the conduct of this office consultation and >50% of this time involved direct face-to-face encounter with the patient for counseling and/or coordination of their care.    December 05, 2019.  Roxy Manns, MD 11/05/2019 12:03 PM

## 2019-11-05 NOTE — Patient Instructions (Addendum)
COVID-19 Education: The signs and symptoms of COVID-19 were discussed with the patient and how to seek care for testing (follow up with PCP or arrange E-visit).   The importance of social distancing was discussed today.  COVID-19 Vaccine Information can be found at: [email or web address] For questions related to vaccine distribution or appointments, please email [email or web address] or call [phone].    PLAN: 1.  Patient to continue salt water rinses every 2 hours while awake as needed to aid healing. 2.  Advance diet as tolerated to soft diet.  Consider nutritional supplementation with Glucerna. 3.  Patient is now cleared dentally for heart for heart valve surgery to be scheduled at the discretion of Dr. Cornelius Moras. 4.  Patient is to follow-up with the primary dentist of his choice for fabrication of upper and lower complete dentures with or without implants after adequate healing from dental extractions and once medically stable from the anticipated heart valve surgery. 5.  Patient will need antibiotic premedication prior to invasive dental procedures after the anticipated heart valve surgery per American Heart Association guidelines.   Charlynne Pander, DDS

## 2019-11-05 NOTE — Progress Notes (Signed)
POST OPERATIVE NOTE:  11/05/2019 LEXUS BARLETTA [id number]  COVID 19 SCREENING: The patient does not symptoms concerning for COVID-19 infection (Including fever, chills, cough, or new SHORTNESS OF BREATH).    VITALS: BP 104/71 (BP Location: Right Arm)   Pulse 87   Temp 98.4 F (36.9 C)   LABS:  Lab Results  Component Value Date   WBC 9.1 10/24/2019   HGB 14.0 10/24/2019   HCT 42.6 10/24/2019   MCV 90.3 10/24/2019   PLT 232 10/24/2019   BMET    Component Value Date/Time   NA 135 10/24/2019 0927   NA 134 09/10/2019 1231   K 4.9 10/24/2019 0927   CL 101 10/24/2019 0927   CO2 27 10/24/2019 0927   GLUCOSE 173 (H) 10/24/2019 0927   BUN 13 10/24/2019 0927   BUN 12 09/10/2019 1231   CREATININE 0.82 10/24/2019 0927   CREATININE 0.77 01/07/2017 1040   CALCIUM 9.4 10/24/2019 0927   GFRNONAA >60 10/24/2019 0927   GFRNONAA >89 04/08/2016 0903   GFRAA >60 10/24/2019 0927   GFRAA >89 04/08/2016 0903    Lab Results  Component Value Date   INR 1.1 06/11/2019   No results found for: PTT   [name] is status post extraction of remaining teeth with alveoloplasty in the operating room with general anesthesia on 10/26/2019.  Patient now presents for evaluation of healing and suture removal.  SUBJECTIVE: Patient with minimal discomfort from dental extractions.  Patient denies any active bleeding.  Patient indictates that several stitches still remain.  EXAM: There is no sign of infection, heme, or ooze.  Sutures are loosely intact.  Patient is healing in by generalized primary closure with several areas healing in by secondary intention.  Patient is now edentulous.  There is atrophy of the edentulous alveolar ridges.  PROCEDURE: The patient was given a chlorhexidine gluconate rinse for 30 seconds. Sutures were then removed without complication. Patient tolerated the procedure well.  ASSESSMENT: Post operative course is consistent with dental procedures performed in the  operating room with general anesthesia. Loss of teeth due to extraction Patient is now completely edentulous. There is atrophy of the edentulous alveolar ridges   PLAN: 1.  Patient to continue salt water rinses every 2 hours while awake as needed to aid healing. 2.  Advance diet as tolerated to soft diet.  Consider nutritional supplementation with Glucerna. 3.  Patient is now cleared dentally for heart for heart valve surgery to be scheduled at the discretion of Dr. Cornelius Moras. 4.  Patient is to follow-up with the primary dentist of his choice for fabrication of upper and lower complete dentures with or without implants after adequate healing from dental extractions and once medically stable from the anticipated heart valve surgery. 5.  Patient will need antibiotic premedication prior to invasive dental procedures after the anticipated heart valve surgery per American Heart Association guidelines.   Charlynne Pander, DDS

## 2019-11-05 NOTE — Patient Instructions (Signed)
   Continue taking all current medications without change through the day before surgery.  Make sure to bring all of your medications with you when you come for your Pre-Admission Testing appointment at [facility] Short-Stay Department.  Have nothing to eat or drink after midnight the night before surgery.  On the morning of surgery do not take any medications.  At your appointment for Pre-Admission Testing at the [facility] Short-Stay Department you will be asked to sign permission forms for your upcoming surgery.  By definition your signature on these forms implies that you and/or your designee provide full informed consent for your planned surgical procedure(s), that alternative treatment options have been discussed, that you understand and accept any and all potential risks, and that you have some understanding of what to expect for your post-operative convalescence.  For any major cardiac surgical procedure potential operative risks include but are not limited to at least some risk of death, stroke or other neurologic complication, myocardial infarction, congestive heart failure, respiratory failure, renal failure, bleeding requiring blood transfusion and/or reexploration, irregular heart rhythm, heart block or bradycardia requiring permanent pacemaker, pneumonia, pericardial effusion, pleural effusion, wound infection, pulmonary embolus or other thromboembolic complication, chronic pain, or other complications related to the specific procedure(s) performed.  Please call to schedule a follow-up appointment in our office prior to surgery if you have any unresolved questions about your planned surgical procedure, the associated risks, alternative treatment options, and/or expectations for your post-operative recovery.   

## 2019-11-10 ENCOUNTER — Emergency Department (HOSPITAL_COMMUNITY): Payer: Self-pay

## 2019-11-10 ENCOUNTER — Inpatient Hospital Stay (HOSPITAL_COMMUNITY)
Admission: EM | Admit: 2019-11-10 | Discharge: 2019-11-11 | DRG: 307 | Disposition: A | Discharge status: Home / Self Care | Payer: Self-pay | Attending: Cardiovascular Disease | Admitting: Cardiovascular Disease

## 2019-11-10 DIAGNOSIS — E785 Hyperlipidemia, unspecified: Secondary | ICD-10-CM | POA: Diagnosis present

## 2019-11-10 DIAGNOSIS — Z8349 Family history of other endocrine, nutritional and metabolic diseases: Secondary | ICD-10-CM

## 2019-11-10 DIAGNOSIS — R7989 Other specified abnormal findings of blood chemistry: Secondary | ICD-10-CM | POA: Diagnosis present

## 2019-11-10 DIAGNOSIS — R55 Syncope and collapse: Secondary | ICD-10-CM

## 2019-11-10 DIAGNOSIS — Z8249 Family history of ischemic heart disease and other diseases of the circulatory system: Secondary | ICD-10-CM

## 2019-11-10 DIAGNOSIS — Z20822 Contact with and (suspected) exposure to covid-19: Secondary | ICD-10-CM | POA: Diagnosis present

## 2019-11-10 DIAGNOSIS — Z823 Family history of stroke: Secondary | ICD-10-CM

## 2019-11-10 DIAGNOSIS — E119 Type 2 diabetes mellitus without complications: Secondary | ICD-10-CM

## 2019-11-10 DIAGNOSIS — Z794 Long term (current) use of insulin: Secondary | ICD-10-CM

## 2019-11-10 DIAGNOSIS — I248 Other forms of acute ischemic heart disease: Secondary | ICD-10-CM | POA: Diagnosis present

## 2019-11-10 DIAGNOSIS — R079 Chest pain, unspecified: Secondary | ICD-10-CM | POA: Diagnosis present

## 2019-11-10 DIAGNOSIS — R9431 Abnormal electrocardiogram [ECG] [EKG]: Secondary | ICD-10-CM

## 2019-11-10 DIAGNOSIS — E782 Mixed hyperlipidemia: Secondary | ICD-10-CM | POA: Diagnosis present

## 2019-11-10 DIAGNOSIS — Q231 Congenital insufficiency of aortic valve: Principal | ICD-10-CM

## 2019-11-10 DIAGNOSIS — I10 Essential (primary) hypertension: Secondary | ICD-10-CM | POA: Diagnosis present

## 2019-11-10 DIAGNOSIS — Z6841 Body Mass Index (BMI) 40.0 and over, adult: Secondary | ICD-10-CM

## 2019-11-10 DIAGNOSIS — I214 Non-ST elevation (NSTEMI) myocardial infarction: Secondary | ICD-10-CM

## 2019-11-10 DIAGNOSIS — E1169 Type 2 diabetes mellitus with other specified complication: Secondary | ICD-10-CM | POA: Diagnosis present

## 2019-11-10 DIAGNOSIS — E66813 Obesity, class 3: Secondary | ICD-10-CM | POA: Diagnosis present

## 2019-11-10 DIAGNOSIS — Z7982 Long term (current) use of aspirin: Secondary | ICD-10-CM

## 2019-11-10 DIAGNOSIS — Z87891 Personal history of nicotine dependence: Secondary | ICD-10-CM

## 2019-11-10 DIAGNOSIS — I350 Nonrheumatic aortic (valve) stenosis: Secondary | ICD-10-CM

## 2019-11-10 DIAGNOSIS — Z79899 Other long term (current) drug therapy: Secondary | ICD-10-CM

## 2019-11-10 DIAGNOSIS — R778 Other specified abnormalities of plasma proteins: Secondary | ICD-10-CM | POA: Diagnosis present

## 2019-11-10 DIAGNOSIS — F419 Anxiety disorder, unspecified: Secondary | ICD-10-CM | POA: Diagnosis present

## 2019-11-10 DIAGNOSIS — I2510 Atherosclerotic heart disease of native coronary artery without angina pectoris: Secondary | ICD-10-CM | POA: Diagnosis present

## 2019-11-10 LAB — URINALYSIS, ROUTINE W REFLEX MICROSCOPIC
Bacteria, UA: NONE SEEN
Bilirubin Urine: NEGATIVE
Glucose, UA: 50 mg/dL — AB
Hgb urine dipstick: NEGATIVE
Ketones, ur: NEGATIVE mg/dL
Leukocytes,Ua: NEGATIVE
Nitrite: NEGATIVE
Protein, ur: 100 mg/dL — AB
Specific Gravity, Urine: 1.013 (ref 1.005–1.030)
pH: 6 (ref 5.0–8.0)

## 2019-11-10 LAB — CBC WITH DIFFERENTIAL/PLATELET
Abs Immature Granulocytes: 0.05 10*3/uL (ref 0.00–0.07)
Basophils Absolute: 0.1 10*3/uL (ref 0.0–0.1)
Basophils Relative: 1 %
Eosinophils Absolute: 0 10*3/uL (ref 0.0–0.5)
Eosinophils Relative: 0 %
HCT: 39.1 % (ref 39.0–52.0)
Hemoglobin: 12.9 g/dL — ABNORMAL LOW (ref 13.0–17.0)
Immature Granulocytes: 1 %
Lymphocytes Relative: 12 %
Lymphs Abs: 1.3 10*3/uL (ref 0.7–4.0)
MCH: 30 pg (ref 26.0–34.0)
MCHC: 33 g/dL (ref 30.0–36.0)
MCV: 90.9 fL (ref 80.0–100.0)
Monocytes Absolute: 0.7 10*3/uL (ref 0.1–1.0)
Monocytes Relative: 6 %
Neutro Abs: 9 10*3/uL — ABNORMAL HIGH (ref 1.7–7.7)
Neutrophils Relative %: 80 %
Platelets: 233 10*3/uL (ref 150–400)
RBC: 4.3 MIL/uL (ref 4.22–5.81)
RDW: 12.8 % (ref 11.5–15.5)
WBC: 11.1 10*3/uL — ABNORMAL HIGH (ref 4.0–10.5)
nRBC: 0 % (ref 0.0–0.2)

## 2019-11-10 LAB — BASIC METABOLIC PANEL
Anion gap: 11 (ref 5–15)
BUN: 16 mg/dL (ref 6–20)
CO2: 25 mmol/L (ref 22–32)
Calcium: 9 mg/dL (ref 8.9–10.3)
Chloride: 97 mmol/L — ABNORMAL LOW (ref 98–111)
Creatinine, Ser: 0.87 mg/dL (ref 0.61–1.24)
GFR calc Af Amer: >60 mL/min (ref >60)
GFR calc non Af Amer: >60 mL/min (ref >60)
Glucose, Bld: 224 mg/dL — ABNORMAL HIGH (ref 70–99)
Potassium: 4.5 mmol/L (ref 3.5–5.1)
Sodium: 133 mmol/L — ABNORMAL LOW (ref 135–145)

## 2019-11-10 LAB — RESPIRATORY PANEL BY RT PCR (FLU A&B, COVID)
Influenza A by PCR: NEGATIVE
Influenza B by PCR: NEGATIVE
SARS Coronavirus 2 by RT PCR: NEGATIVE

## 2019-11-10 LAB — TROPONIN I (HIGH SENSITIVITY)
Troponin I (High Sensitivity): 107 ng/L (ref <18)
Troponin I (High Sensitivity): 482 ng/L (ref <18)

## 2019-11-10 NOTE — ED Provider Notes (Signed)
[facility] EMERGENCY DEPARTMENT Provider Note   CSN: [id number] Arrival date & time: 11/10/19  1639     History No chief complaint on file.   Adam KELEMEN is a 44 y.o. male.  HPI Presents for evaluation of syncope.  He was apparently in the bathroom after he woke up from passing out.  He recalls rushing to the bathroom to use the facility.  A friend of his, found him on the floor in the bathroom.  Patient recalls rushing to the bathroom, to urinate, then feeling woozy, whereupon he apparently passed out.  No prior episodes of syncope.  Patient denies headache, neck pain, back pain.  He feels his normal self.  He has not previously had syncope.  He states he was referred for replacement of the aortic valve, due to stenosis, based on a recent cardiac catheterization.  Upcoming plans are to replace the valve, at [facility], next month.  Patient denies fever, chills, cough, shortness of breath, nausea, vomiting, anorexia, new swelling in his legs, palpitations or weakness.    Past Medical History:  Diagnosis Date  . Anxiety   . Aortic stenosis   . Cholelithiasis   . Coronary artery disease    non-obstructive CAD (40-50% LAD) 08/2019  . Depression   . Diabetes mellitus   . Dyspnea    per pt upon exertion  . Heart murmur    per pt 10/24/19 dx in 06/2019  . Hypercholesteremia   . Hypertension   . Morbid obesity [facility])     Patient Active Problem List   Diagnosis Date Noted  . Syncope 11/10/2019  . Severe aortic stenosis 09/24/2019  . Gangrene of toe of left foot (HCC) 07/06/2019  . Cellulitis of fourth toe of left foot   . Cellulitis and abscess of lower extremity 06/11/2019  . Personal history of noncompliance with medical treatment, presenting hazards to health 03/16/2018  . Uncontrolled type 2 diabetes mellitus with complication (HCC) 10/12/2015  . Mixed hyperlipidemia 10/12/2015  . Morbid obesity (HCC) 10/12/2015  . Essential hypertension, benign 10/12/2015  . Chest  pain 07/02/2013  . HTN (hypertension) 07/02/2013  . DM (diabetes mellitus) (HCC) 07/02/2013    Past Surgical History:  Procedure Laterality Date  . ABDOMINAL AORTOGRAM W/LOWER EXTREMITY N/A 07/06/2019   Procedure: ABDOMINAL AORTOGRAM W/LOWER EXTREMITY;  Surgeon: Sherren Kerns, MD;  Location: [facility] INVASIVE CV LAB;  Service: Cardiovascular;  Laterality: N/A;  Bilateral  . AMPUTATION Left 07/09/2019   Procedure: LEFT FOURTH and Fifth TOE AMPUTATION.;  Surgeon: Larina Earthly, MD;  Location: [facility] OR;  Service: Vascular;  Laterality: Left;  . IR RADIOLOGY PERIPHERAL GUIDED IV START  10/05/2019  . IR US GUIDE VASC ACCESS RIGHT  10/05/2019  . MULTIPLE EXTRACTIONS WITH ALVEOLOPLASTY N/A 10/26/2019   Procedure: EXTRACTION OF TOOTH #'S 3, 5-11,19-28,  AND 32 WITH ALVEOLOPLASTY;  Surgeon: Charlynne Pander, DDS;  Location: [facility] OR;  Service: Oral Surgery;  Laterality: N/A;  . RIGHT HEART CATH AND CORONARY ANGIOGRAPHY N/A 09/24/2019   Procedure: RIGHT HEART CATH AND CORONARY ANGIOGRAPHY;  Surgeon: Tonny Bollman, MD;  Location: [facility] INVASIVE CV LAB;  Service: Cardiovascular;  Laterality: N/A;       Family History  Problem Relation Age of Onset  . Cancer Mother        brain  . Heart disease Father   . Hyperlipidemia Father   . Hypertension Father   . Stroke Father   . Heart murmur Sister     Social History  Tobacco Use  . Smoking status: Never Smoker  . Smokeless tobacco: Former Neurosurgeon    Types: Chew  Substance Use Topics  . Alcohol use: Not Currently    Comment: occasionally  . Drug use: No    Home Medications Prior to Admission medications   Medication Sig Start Date End Date Taking? Authorizing Provider  aspirin EC 81 MG tablet Take 81 mg by mouth every morning.    Yes [provider]  atorvastatin (LIPITOR) 40 MG tablet Take 1 tablet (40 mg total) by mouth daily. Patient taking differently: Take 40 mg by mouth every morning.  10/23/19  Yes Jacquelin Hawking, PA-C  Insulin  Glargine (LANTUS SOLOSTAR) 100 UNIT/ML Solostar Pen Inject 40 Units into the skin at bedtime. 10/25/19  Yes Nida, Denman George, MD  lisinopril (ZESTRIL) 20 MG tablet Take 1 tablet (20 mg total) by mouth daily. Patient taking differently: Take 20 mg by mouth every morning.  08/29/19  Yes Jacquelin Hawking, PA-C  metoprolol tartrate (LOPRESSOR) 50 MG tablet Take 1 tablet (50 mg total) by mouth 2 (two) times daily. 08/29/19  Yes Jacquelin Hawking, PA-C  sitaGLIPtin-metformin (JANUMET) 50-500 MG tablet Take 1 tablet by mouth 2 (two) times daily with a meal. 08/29/19  Yes Jacquelin Hawking, PA-C  oxyCODONE-acetaminophen (PERCOCET) 5-325 MG tablet Take one tablet by mouth every 4 hours as needed for pain. Patient not taking: Reported on 11/10/2019 10/26/19   Charlynne Pander, DDS    Allergies    Pollen extract  Review of Systems   Review of Systems  All other systems reviewed and are negative.   Physical Exam Updated Vital Signs BP 119/83   Pulse 89   Temp 98.1 F (36.7 C) (Oral)   Resp (!) 27   Ht 5\' 10"  (1.778 m)   Wt (!) 176.9 kg   SpO2 99%   BMI 55.96 kg/m   Physical Exam Vitals and nursing note reviewed.  Constitutional:      General: He is not in acute distress.    Appearance: He is well-developed. He is obese. He is not ill-appearing, toxic-appearing or diaphoretic.  HENT:     Head: Normocephalic and atraumatic.     Right Ear: External ear normal.     Left Ear: External ear normal.  Eyes:     Conjunctiva/sclera: Conjunctivae normal.     Pupils: Pupils are equal, round, and reactive to light.  Neck:     Trachea: Phonation normal.  Cardiovascular:     Rate and Rhythm: Normal rate and regular rhythm.     Heart sounds: Murmur (Consistent with aortic stenosis) present.  Pulmonary:     Effort: Pulmonary effort is normal.     Breath sounds: Normal breath sounds.  Abdominal:     General: There is no distension.     Palpations: Abdomen is soft.     Tenderness: There is no  abdominal tenderness.  Musculoskeletal:        General: Normal range of motion.     Cervical back: Normal range of motion and neck supple.     Right lower leg: Edema present.     Left lower leg: Edema present.  Skin:    General: Skin is warm and dry.  Neurological:     Mental Status: He is alert and oriented to person, place, and time.     Cranial Nerves: No cranial nerve deficit.     Sensory: No sensory deficit.     Motor: No abnormal muscle tone.  Coordination: Coordination normal.  Psychiatric:        Mood and Affect: Mood normal.        Behavior: Behavior normal.        Thought Content: Thought content normal.        Judgment: Judgment normal.     ED Results / Procedures / Treatments   Labs (all labs ordered are listed, but only abnormal results are displayed) Labs Reviewed  BASIC METABOLIC PANEL - Abnormal; Notable for the following components:      Result Value   Sodium 133 (*)    Chloride 97 (*)    Glucose, Bld 224 (*)    All other components within normal limits  CBC WITH DIFFERENTIAL/PLATELET - Abnormal; Notable for the following components:   WBC 11.1 (*)    Hemoglobin 12.9 (*)    Neutro Abs 9.0 (*)    All other components within normal limits  TROPONIN I (HIGH SENSITIVITY) - Abnormal; Notable for the following components:   Troponin I (High Sensitivity) 107 (*)    All other components within normal limits  RESPIRATORY PANEL BY RT PCR (FLU A&B, COVID)  URINALYSIS, ROUTINE W REFLEX MICROSCOPIC  TROPONIN I (HIGH SENSITIVITY)    EKG EKG Interpretation  Date/Time:  Saturday November 10 2019 16:55:31 EST Ventricular Rate:  100 PR Interval:    QRS Duration: 121 QT Interval:  383 QTC Calculation: 494 R Axis:   70 Text Interpretation: Sinus tachycardia Nonspecific intraventricular conduction delay Repol abnrm, severe global ischemia (LM/MVD) Since last tracing st abnormality is new Otherwise no significant change Confirmed by Mancel Bale [phone]) on  11/10/2019 4:58:04 PM   Radiology DG Chest Port 1 View  Result Date: 11/10/2019 CLINICAL DATA:  Found down, syncope EXAM: PORTABLE CHEST 1 VIEW COMPARISON:  07/13/2019 FINDINGS: The heart size and mediastinal contours are within normal limits. Both lungs are clear. The visualized skeletal structures are unremarkable. IMPRESSION: No active disease. Electronically Signed   By: Sharlet Salina M.D.   On: 11/10/2019 20:04    Procedures .Critical Care Performed by: Mancel Bale, MD Authorized by: Mancel Bale, MD   Critical care provider statement:    Critical care time (minutes):  50   Critical care start time:  11/10/2019 4:50 PM   Critical care end time:  11/10/2019 8:24 PM   Critical care time was exclusive of:  Separately billable procedures and treating other patients   Critical care was necessary to treat or prevent imminent or life-threatening deterioration of the following conditions:  Circulatory failure   Critical care was time spent personally by me on the following activities:  Blood draw for specimens, development of treatment plan with patient or surrogate, discussions with consultants, evaluation of patient's response to treatment, examination of patient, obtaining history from patient or surrogate, ordering and performing treatments and interventions, ordering and review of laboratory studies, pulse oximetry, re-evaluation of patient's condition, review of old charts and ordering and review of radiographic studies   (including critical care time)  Medications Ordered in ED Medications - No data to display  ED Course  I have reviewed the triage vital signs and the nursing notes.  Pertinent labs & imaging results that were available during my care of the patient were reviewed by me and considered in my medical decision making (see chart for details).  Clinical Course as of Nov 10 2023  Sat Nov 10, 2019  1933 He is comfortable, vital signs normal including oxygenation.  He  denies chest pain at  this time.  He is sitting up on the edge of the stretcher watching TV.   [EW]  1933 Abnormal, high  Troponin I (High Sensitivity)(!!) [EW]  1933 Normal except elevated white count and hemoglobin slightly low  CBC with Differential(!) [EW]  1933 Normal except sodium low, chloride low, glucose high  Basic metabolic panel(!) [EW]  2440 Case discussed with cardiology fellow at [facility], she accepts the patient for admission to the cardiology service.  Patient will be admitted to a stepdown unit.  I wrote holding orders for admission.   [EW]    Clinical Course User Index [EW] Daleen Bo, MD   MDM Rules/Calculators/A&P                       Patient Vitals for the past 24 hrs:  BP Temp Temp src Pulse Resp SpO2 Height Weight  11/10/19 2000 119/83 -- -- 89 -- 99 % -- --  11/10/19 1945 -- -- -- 89 (!) 27 95 % -- --  11/10/19 1800 111/73 -- -- 90 (!) 25 99 % -- --  11/10/19 1700 (!) 103/51 -- -- 95 (!) 23 97 % -- --  11/10/19 1652 -- 98.1 F (36.7 C) Oral (!) 116 (!) 22 97 % -- --  11/10/19 1649 -- -- -- -- -- -- 5\' 10"  (1.778 m) (!) 176.9 kg    8:25 PM Reevaluation with update and discussion. After initial assessment and treatment, an updated evaluation reveals he is agreeable to transfer at this time.Daleen Bo   Medical Decision Making: Syncope with known severe aortic stenosis.  Recent cardiac catheterization showed moderate LAD stenosis mild nonobstructive disease.  He is morbidly obese.  Initial EKG indicates diffuse ST depression, he is not having chest pain.  Initial high-sensitivity troponin elevated, but no comparison values are available.  Patient has known history of severe pulmonary hypertension, with a PA wedge pressure of 51 mmHg.  Elevated troponin, initially, delta pending.  Will require hospitalization for management of syncope with severe aortic stenosis, and coronary artery disease.  MARISOL GLAZER was evaluated in Emergency Department on  11/10/2019 for the symptoms described in the history of present illness. He was evaluated in the context of the global COVID-19 pandemic, which necessitated consideration that the patient might be at risk for infection with the SARS-CoV-2 virus that causes COVID-19. Institutional protocols and algorithms that pertain to the evaluation of patients at risk for COVID-19 are in a state of rapid change based on information released by regulatory bodies including the CDC and federal and state organizations. These policies and algorithms were followed during the patient's care in the ED.   CRITICAL CARE- yes Performed by: Daleen Bo  Final Clinical Impression(s) / ED Diagnoses Final diagnoses:  Syncope, unspecified syncope type  Aortic valve stenosis, etiology of cardiac valve disease unspecified  Elevated troponin  Abnormal EKG   Nursing Notes Reviewed/ Care Coordinated Applicable Imaging Reviewed Interpretation of Laboratory Data incorporated into ED treatment   Plan: Admit/transfer to [facility].   Rx / [place] Orders ED Discharge Orders    None       Daleen Bo, MD 11/10/19 2026

## 2019-11-10 NOTE — H&P (Signed)
Cardiology Admission History and Physical:   Patient ID: Adam Long MRN: [id number]; DOB: Jul 15, 1976   Admission date: 11/10/2019  Primary Care Provider: Jacquelin Hawking, PA-C Primary Cardiologist: Prentice Docker, MD  Primary Electrophysiologist:  None   Chief Complaint:  Syncope  Patient Profile:   Adam Long is a 44 y.o. male with hypertension, coronary artery disease and severe aortic stenosis who presents with syncope.  History of Present Illness:   Adam Long reports that he was in his usual state of health when he went to the tractor supply store with a friend to get supplies given the power outage.  He reports that he was walking more quickly than usual trying to keep up and then needed to use the restroom and thus was rushing to the restroom when he suddenly began to feel weak lightheaded and short of breath.  He reports he is able to mostly lower himself to the ground and then lost consciousness.  Upon awaking, he reports that he briefly felt dizzy but this rapidly resolved.  Given his episode of syncope he presented to the emergency department.  The patient denies any chest pain, orthopnea, PND.  He does report ongoing lower extremity edema which has been present for the last several months.  He denies any other symptoms including fevers, chills, cough, urinary symptoms, changes in bowel or bladder habits.  Upon presentation to the emergency room he was hemodynamically stable and was found to have an elevated troponin level at 107 which increased further to 482.  His EKG showed ST elevation in aVR with diffuse depressions.  He was started on a heparin drip and given aspirin and is now admitted for further management.  Notably, the patient has known severe aortic stenosis as well as moderate LAD disease.  He has a scheduled SAVR coming up in March with Dr. Cornelius Moras.  He recently underwent dental extractions in preparation for this procedure.  Heart Pathway Score:     Past  Medical History:  Diagnosis Date  . Anxiety   . Aortic stenosis   . Cholelithiasis   . Coronary artery disease    non-obstructive CAD (40-50% LAD) 08/2019  . Depression   . Diabetes mellitus   . Dyspnea    per pt upon exertion  . Heart murmur    per pt 10/24/19 dx in 06/2019  . Hypercholesteremia   . Hypertension   . Morbid obesity (HCC)     Past Surgical History:  Procedure Laterality Date  . ABDOMINAL AORTOGRAM W/LOWER EXTREMITY N/A 07/06/2019   Procedure: ABDOMINAL AORTOGRAM W/LOWER EXTREMITY;  Surgeon: Sherren Kerns, MD;  Location: [facility] INVASIVE CV LAB;  Service: Cardiovascular;  Laterality: N/A;  Bilateral  . AMPUTATION Left 07/09/2019   Procedure: LEFT FOURTH and Fifth TOE AMPUTATION.;  Surgeon: Larina Earthly, MD;  Location: [facility] OR;  Service: Vascular;  Laterality: Left;  . IR RADIOLOGY PERIPHERAL GUIDED IV START  10/05/2019  . IR US GUIDE VASC ACCESS RIGHT  10/05/2019  . MULTIPLE EXTRACTIONS WITH ALVEOLOPLASTY N/A 10/26/2019   Procedure: EXTRACTION OF TOOTH #'S 3, 5-11,19-28,  AND 32 WITH ALVEOLOPLASTY;  Surgeon: Charlynne Pander, DDS;  Location: [facility] OR;  Service: Oral Surgery;  Laterality: N/A;  . RIGHT HEART CATH AND CORONARY ANGIOGRAPHY N/A 09/24/2019   Procedure: RIGHT HEART CATH AND CORONARY ANGIOGRAPHY;  Surgeon: Tonny Bollman, MD;  Location: [facility] INVASIVE CV LAB;  Service: Cardiovascular;  Laterality: N/A;     Medications Prior to Admission: Prior to Admission medications  Medication Sig Start Date End Date Taking? Authorizing Provider  aspirin EC 81 MG tablet Take 81 mg by mouth every morning.    Yes [provider]  atorvastatin (LIPITOR) 40 MG tablet Take 1 tablet (40 mg total) by mouth daily. Patient taking differently: Take 40 mg by mouth every morning.  10/23/19  Yes Jacquelin Hawking, PA-C  Insulin Glargine (LANTUS SOLOSTAR) 100 UNIT/ML Solostar Pen Inject 40 Units into the skin at bedtime. 10/25/19  Yes Nida, Denman George, MD  lisinopril (ZESTRIL) 20  MG tablet Take 1 tablet (20 mg total) by mouth daily. Patient taking differently: Take 20 mg by mouth every morning.  08/29/19  Yes Jacquelin Hawking, PA-C  metoprolol tartrate (LOPRESSOR) 50 MG tablet Take 1 tablet (50 mg total) by mouth 2 (two) times daily. 08/29/19  Yes Jacquelin Hawking, PA-C  sitaGLIPtin-metformin (JANUMET) 50-500 MG tablet Take 1 tablet by mouth 2 (two) times daily with a meal. 08/29/19  Yes Jacquelin Hawking, PA-C  oxyCODONE-acetaminophen (PERCOCET) 5-325 MG tablet Take one tablet by mouth every 4 hours as needed for pain. Patient not taking: Reported on 11/10/2019 10/26/19   Charlynne Pander, DDS     Allergies:    Allergies  Allergen Reactions  . Pollen Extract Other (See Comments)    Runny nose, watery eyes, sneezing    Social History:   Social History   Socioeconomic History  . Marital status: Single    Spouse name: Not on file  . Number of children: Not on file  . Years of education: Not on file  . Highest education level: Not on file  Occupational History  . Not on file  Tobacco Use  . Smoking status: Never Smoker  . Smokeless tobacco: Former Neurosurgeon    Types: Chew  Substance and Sexual Activity  . Alcohol use: Not Currently    Comment: occasionally  . Drug use: No  . Sexual activity: Not on file  Other Topics Concern  . Not on file  Social History Narrative  . Not on file   Social Determinants of Health   Financial Resource Strain:   . Difficulty of Paying Living Expenses: Not on file  Food Insecurity:   . Worried About Programme researcher, broadcasting/film/video in the Last Year: Not on file  . Ran Out of Food in the Last Year: Not on file  Transportation Needs:   . Lack of Transportation (Medical): Not on file  . Lack of Transportation (Non-Medical): Not on file  Physical Activity:   . Days of Exercise per Week: Not on file  . Minutes of Exercise per Session: Not on file  Stress:   . Feeling of Stress : Not on file  Social Connections:   . Frequency of  Communication with Friends and Family: Not on file  . Frequency of Social Gatherings with Friends and Family: Not on file  . Attends Religious Services: Not on file  . Active Member of Clubs or Organizations: Not on file  . Attends Banker Meetings: Not on file  . Marital Status: Not on file  Intimate Partner Violence:   . Fear of Current or Ex-Partner: Not on file  . Emotionally Abused: Not on file  . Physically Abused: Not on file  . Sexually Abused: Not on file    Family History:  The patient's family history includes Cancer in his mother; Heart disease in his father; Heart murmur in his sister; Hyperlipidemia in his father; Hypertension in his father; Stroke in his father.  ROS:  Please see the history of present illness.  All other ROS reviewed and negative.     Physical Exam/Data:   Vitals:   11/10/19 2230 11/10/19 2300 11/11/19 0020 11/11/19 0031  BP: 115/78 (!) 124/97  115/88  Pulse: 83 93  81  Resp: 19 18  16   Temp:    98.5 F (36.9 C)  TempSrc:    Oral  SpO2: 95% 95%    Weight:   (!) 171.6 kg   Height:   5\' 10"  (1.778 m)     Intake/Output Summary (Last 24 hours) at 11/11/2019 0147 Last data filed at 11/11/2019 0100 Gross per 24 hour  Intake 222 ml  Output --  Net 222 ml   Last 3 Weights 11/11/2019 11/10/2019 11/05/2019  Weight (lbs) 378 lb 5 oz 390 lb 390 lb  Weight (kg) 171.6 kg 176.903 kg 176.903 kg     Body mass index is 54.28 kg/m.  General: Obese gentleman, well developed, in no acute distress HEENT: normal Lymph: no adenopathy Neck: no JVD Cardiac:  normal S1 with absent S2 in the setting of 3/6 crescendo-decrescendo systolic murmur Lungs:  clear to auscultation bilaterally, no wheezing, rhonchi or rales  Abd: soft, nontender, no hepatomegaly  Ext: Tense bilateral 2-3+ pitting edema in the lower extremities Skin: warm and dry  Neuro:  CNs 2-12 intact, no focal abnormalities noted Psych:  Normal affect    EKG:  The ECG that was done  11/10/2019 was personally reviewed and demonstrates 1 mm of ST elevation in aVR with diffuse depressions  Relevant CV Studies: TTE 06/12/19 1. The left ventricle has normal systolic function, with an ejection  fraction of 55-60%. The cavity size was normal. There is mildly increased  left ventricular wall thickness. Left ventricular diastolic Doppler  parameters are consistent with  pseudonormalization. Elevated mean left atrial pressure.  2. The right ventricle has normal systolic function. The cavity was  normal. There is no increase in right ventricular wall thickness.  3. The aortic valve has an indeterminate number of cusps. Severely  thickening of the aortic valve. Severe calcifcation of the aortic valve.  Severe stenosis of the aortic valve. Severe aortic annular calcification  noted.  4. The mitral valve is abnormal. Moderate thickening of the mitral valve  leaflet. Moderate calcification of the mitral valve leaflet. There is  moderate mitral annular calcification present. No evidence of mitral valve  stenosis.  5. The aorta is normal unless otherwise noted.  6. The aortic root is normal in size and structure.  7. Pulmonary hypertension is indeterminant, inadequate TR jet.  8. The interatrial septum was not well visualized.   Cath 09/24/19 1.  Calcified coronary arteries with moderate mid LAD stenosis, otherwise mild nonobstructive disease 2.  Known severe aortic stenosis by echo assessment with heavy calcification and restriction of the aortic valve leaflets as assessed by plain fluoroscopy 3.  Severe pulmonary hypertension with mean PA pressure 51 mmHg, at least in part to left heart disease with elevated wedge pressure of 31 mmHg  Recommendations: Ongoing multidisciplinary heart team evaluation for treatment of severe symptomatic aortic stenosis.  Medical therapy for nonobstructive CAD.  Laboratory Data:  High Sensitivity Troponin:   Recent Labs  Lab 11/10/19 1805  11/10/19 2121  TROPONINIHS 107* 482*      Chemistry Recent Labs  Lab 11/10/19 1805  NA 133*  K 4.5  CL 97*  CO2 25  GLUCOSE 224*  BUN 16  CREATININE 0.87  CALCIUM 9.0  GFRNONAA >60  GFRAA >60  ANIONGAP 11    No results for input(s): PROT, ALBUMIN, AST, ALT, ALKPHOS, BILITOT in the last 168 hours. Hematology Recent Labs  Lab 11/10/19 1805  WBC 11.1*  RBC 4.30  HGB 12.9*  HCT 39.1  MCV 90.9  MCH 30.0  MCHC 33.0  RDW 12.8  PLT 233   BNPNo results for input(s): BNP, PROBNP in the last 168 hours.  DDimer No results for input(s): DDIMER in the last 168 hours.   Radiology/Studies:  DG Chest Port 1 View  Result Date: 11/10/2019 CLINICAL DATA:  Found down, syncope EXAM: PORTABLE CHEST 1 VIEW COMPARISON:  07/13/2019 FINDINGS: The heart size and mediastinal contours are within normal limits. Both lungs are clear. The visualized skeletal structures are unremarkable. IMPRESSION: No active disease. Electronically Signed   By: Randa Ngo M.D.   On: 11/10/2019 20:04   TIMI Risk Score for Unstable Angina or Non-ST Elevation MI:   The patient's TIMI risk score is 5, which indicates a 26% risk of all cause mortality, new or recurrent myocardial infarction or need for urgent revascularization in the next 14 days.   Assessment and Plan:  Mr. Manzo is a 44 y.o. male with hypertension, coronary artery disease and severe aortic stenosis who presents with syncope. Found to have NSTEMI.   # Syncope # Severe AS Patient presents with syncope in the setting of trying to rush to the bathroom.  Did have a prodrome of feeling weak lightheaded and short of breath.  Likely related to supply-demand mismatch in the setting of known severe aortic stenosis.  Cannot rule out new type I ischemic event, as below, or arrhythmia. -Continue telemetry -Trend EKGs -Management of NSTEMI as below -Patient has scheduled SAVR procedure in March.  Given recurrent acute admission, consider discussion with CT  surgery whether patient would benefit from potentially earlier surgical date -TTE in a.m.  # NSTEMI Patient with significant elevation of cardiac biomarkers with troponin increasing from 107 at presentation to 482 on repeat check.  His heart catheterization in December revealed a moderate mid LAD lesion.  While an acute plaque rupture event cannot be excluded, given the description of the patient's event today, do feel that this was likely a type II NSTEMI in the setting of increased myocardial demand with fixed outflow obstruction in the setting of severe aortic stenosis. -Continue patient's home ASA 81 mg daily, atorvastatin 40 mg daily -Heparin drip for treatment of ACS -Echocardiogram in the morning to evaluate for new wall motion abnormality.  Pending this, will make further decision regarding repeat coronary angiography.  If new wall motion abnormality, would favor left heart cath Monday  # HTN -Continue home metoprolol and lisinopril  # Dyslipidemia -Atorvastatin as above  # DMII -Continue Lantus, will reduce home dose dose while inpatient -Hold home oral diabetes medication -Accu-Cheks and sliding scale insulin  # Anxiety Patient reports significant anxiety regarding his recent diagnoses and known upcoming surgery.  # FEN/GI -Low-fat low-cholesterol, low carbohydrate diet -Supplement electrolytes as needed  FULL CODE  Severity of Illness: The appropriate patient status for this patient is INPATIENT. Inpatient status is judged to be reasonable and necessary in order to provide the required intensity of service to ensure the patient's safety. The patient's presenting symptoms, physical exam findings, and initial radiographic and laboratory data in the context of their chronic comorbidities is felt to place them at high risk for further clinical deterioration. Furthermore, it is not anticipated that the patient will  be medically stable for discharge from the hospital within 2  midnights of admission. The following factors support the patient status of inpatient.   " The patient's presenting symptoms include syncope. " The worrisome physical exam findings include systolic murmur. " The initial radiographic and laboratory data are worrisome because of elevated cardic biomarkers. " The chronic co-morbidities include obesity, HTN, DMII.   * I certify that at the point of admission it is my clinical judgment that the patient will require inpatient hospital care spanning beyond 2 midnights from the point of admission due to high intensity of service, high risk for further deterioration and high frequency of surveillance required.*    For questions or updates, please contact [facility] Please consult [email or web address] for contact info under    Signed, Starlyn Skeans, MD  11/11/2019 1:47 AM

## 2019-11-10 NOTE — ED Notes (Signed)
CRITICAL VALUE ALERT  Critical Value: Troponin 482 Date & Time Notied:  11/10/19 @ 2244 Provider Notified: Dr Effie Shy Orders Received/Actions taken:None yet

## 2019-11-10 NOTE — ED Notes (Signed)
Date and time results received: 11/10/19 19:23 (use smartphrase ".now" to insert current time)  Test:Troponin Critical Value: 107  Name of Provider Notified: Effie Shy  Orders Received? Or Actions Taken?: Provider notified

## 2019-11-10 NOTE — ED Triage Notes (Signed)
Pt found on the floor by his friend in a store after rushing in to use the bathroom. He is not sure how long he was down for. Denies hitting his head. States he is due for a value replacement next month. EMS reported glucose of 284.

## 2019-11-11 ENCOUNTER — Other Ambulatory Visit: Payer: Self-pay

## 2019-11-11 ENCOUNTER — Encounter (HOSPITAL_COMMUNITY): Payer: Self-pay | Admitting: Cardiovascular Disease

## 2019-11-11 ENCOUNTER — Inpatient Hospital Stay (HOSPITAL_COMMUNITY): Payer: Self-pay

## 2019-11-11 DIAGNOSIS — R7989 Other specified abnormal findings of blood chemistry: Secondary | ICD-10-CM

## 2019-11-11 DIAGNOSIS — R778 Other specified abnormalities of plasma proteins: Secondary | ICD-10-CM

## 2019-11-11 DIAGNOSIS — R55 Syncope and collapse: Secondary | ICD-10-CM

## 2019-11-11 HISTORY — DX: Other specified abnormalities of plasma proteins: R77.8

## 2019-11-11 HISTORY — DX: Other specified abnormal findings of blood chemistry: R79.89

## 2019-11-11 LAB — BASIC METABOLIC PANEL
Anion gap: 10 (ref 5–15)
BUN: 10 mg/dL (ref 6–20)
CO2: 28 mmol/L (ref 22–32)
Calcium: 9.5 mg/dL (ref 8.9–10.3)
Chloride: 98 mmol/L (ref 98–111)
Creatinine, Ser: 0.8 mg/dL (ref 0.61–1.24)
GFR calc Af Amer: >60 mL/min (ref >60)
GFR calc non Af Amer: >60 mL/min (ref >60)
Glucose, Bld: 214 mg/dL — ABNORMAL HIGH (ref 70–99)
Potassium: 4.3 mmol/L (ref 3.5–5.1)
Sodium: 136 mmol/L (ref 135–145)

## 2019-11-11 LAB — GLUCOSE, CAPILLARY
Glucose-Capillary: 191 mg/dL — ABNORMAL HIGH (ref 70–99)
Glucose-Capillary: 175 mg/dL — ABNORMAL HIGH (ref 70–99)
Glucose-Capillary: 162 mg/dL — ABNORMAL HIGH (ref 70–99)

## 2019-11-11 LAB — CBC
HCT: 41.2 % (ref 39.0–52.0)
Hemoglobin: 13.6 g/dL (ref 13.0–17.0)
MCH: 29.8 pg (ref 26.0–34.0)
MCHC: 33 g/dL (ref 30.0–36.0)
MCV: 90.4 fL (ref 80.0–100.0)
Platelets: 238 10*3/uL (ref 150–400)
RBC: 4.56 MIL/uL (ref 4.22–5.81)
RDW: 12.8 % (ref 11.5–15.5)
WBC: 10 10*3/uL (ref 4.0–10.5)
nRBC: 0 % (ref 0.0–0.2)

## 2019-11-11 LAB — HEPARIN LEVEL (UNFRACTIONATED): Heparin Unfractionated: 0.2 IU/mL — ABNORMAL LOW (ref 0.30–0.70)

## 2019-11-11 MED ORDER — METOPROLOL TARTRATE 50 MG PO TABS
50.0000 mg | ORAL_TABLET | Freq: Two times a day (BID) | ORAL | Status: DC
  Administered 2019-11-11: 50 mg via ORAL
  Filled 2019-11-11: qty 1

## 2019-11-11 MED ORDER — INSULIN ASPART 100 UNIT/ML ~~LOC~~ SOLN: 0.0000 [IU] | Freq: Every day | SUBCUTANEOUS | Status: DC

## 2019-11-11 MED ORDER — HEPARIN (PORCINE) 25000 UT/250ML-% IV SOLN
1850.0000 [IU]/h | INTRAVENOUS | Status: DC
  Administered 2019-11-11: 1850 [IU]/h via INTRAVENOUS
  Filled 2019-11-11: qty 250

## 2019-11-11 MED ORDER — HEPARIN BOLUS VIA INFUSION
5000.0000 [IU] | Freq: Once | INTRAVENOUS | Status: AC
  Administered 2019-11-11: 5000 [IU] via INTRAVENOUS
  Filled 2019-11-11: qty 5000

## 2019-11-11 MED ORDER — PNEUMOCOCCAL VAC POLYVALENT 25 MCG/0.5ML IJ INJ: 0.5000 mL | INJECTION | INTRAMUSCULAR | Status: DC

## 2019-11-11 MED ORDER — INSULIN GLARGINE 100 UNIT/ML ~~LOC~~ SOLN
20.0000 [IU] | Freq: Every day | SUBCUTANEOUS | Status: DC
  Administered 2019-11-11: 20 [IU] via SUBCUTANEOUS
  Filled 2019-11-11 (×2): qty 0.2

## 2019-11-11 MED ORDER — ASPIRIN EC 81 MG PO TBEC
81.0000 mg | DELAYED_RELEASE_TABLET | Freq: Every morning | ORAL | Status: DC
  Administered 2019-11-11: 81 mg via ORAL
  Filled 2019-11-11: qty 1

## 2019-11-11 MED ORDER — ONDANSETRON HCL 4 MG/2ML IJ SOLN: 4.0000 mg | Freq: Four times a day (QID) | INTRAMUSCULAR | Status: DC | PRN

## 2019-11-11 MED ORDER — INSULIN ASPART 100 UNIT/ML ~~LOC~~ SOLN
0.0000 [IU] | Freq: Three times a day (TID) | SUBCUTANEOUS | Status: DC
  Administered 2019-11-11 (×2): 4 [IU] via SUBCUTANEOUS

## 2019-11-11 MED ORDER — TRAZODONE HCL 50 MG PO TABS
50.0000 mg | ORAL_TABLET | Freq: Every evening | ORAL | Status: DC | PRN
  Administered 2019-11-11: 50 mg via ORAL
  Filled 2019-11-11: qty 1

## 2019-11-11 MED ORDER — LISINOPRIL 20 MG PO TABS
20.0000 mg | ORAL_TABLET | Freq: Every morning | ORAL | Status: DC
  Administered 2019-11-11: 20 mg via ORAL
  Filled 2019-11-11: qty 1

## 2019-11-11 MED ORDER — ACETAMINOPHEN 325 MG PO TABS: 650.0000 mg | ORAL_TABLET | ORAL | Status: DC | PRN

## 2019-11-11 MED ORDER — ATORVASTATIN CALCIUM 40 MG PO TABS
40.0000 mg | ORAL_TABLET | Freq: Every morning | ORAL | Status: DC
  Administered 2019-11-11: 40 mg via ORAL
  Filled 2019-11-11: qty 1

## 2019-11-11 NOTE — Discharge Summary (Signed)
Discharge Summary    Patient ID: Adam Long MRN: [id number]; DOB: 07-02-1976  Admit date: 11/10/2019 Discharge date: 11/11/2019  Primary Care Provider: Soyla Dryer, PA-C  Primary Cardiologist: Kate Sable, MD  Primary Electrophysiologist:  None   Discharge Diagnoses    Principal Problem:   Syncope Active Problems:   Chest pain   HTN (hypertension)   DM (diabetes mellitus) ([place])   Mixed hyperlipidemia   Morbid obesity ([place])   Essential hypertension, benign   Severe aortic stenosis   Elevated troponin level not due myocardial infarction   Allergies Allergies  Allergen Reactions  . Pollen Extract Other (See Comments)    Runny nose, watery eyes, sneezing    Diagnostic Studies/Procedures    None this admit _____________   History of Present Illness     Adam Long is a 44 y.o. male with severe AS, HTN, CAD. He had a syncopal episode and was admitted on 11/10/2019  Hospital Course     Consultants: None  Adam Long had been in a hurry, which he knew he was not supposed to do.  He had been busy and was carrying groceries and and being more active than usual.  He began to feel weak, lightheaded and short of breath.  He had a brief loss of consciousness.  He came to the emergency room and was admitted overnight.  He had no significant arrhythmias overnight.  He had a mild increase in troponin that was not felt clinically significant.  He did not have any chest pain.  His ECG had some diffuse mild ST depression, slightly different from 09/11/2019 ECG.  However, he had no critical coronary artery disease at his cardiac catheterization in December, medical therapy for 50% LAD.  On 2/14, he was seen by Dr. Audie Box and all data were reviewed.  His vital signs were stable.  He had no chest pain or shortness of breath with ambulation.  He had no significant arrhythmias on telemetry.  No further inpatient work-up is indicated and he is considered stable for discharge,  to follow-up as scheduled for surgery.  Did the patient have an acute coronary syndrome (MI, NSTEMI, STEMI, etc) this admission?:  No                               Did the patient have a percutaneous coronary intervention (stent / angioplasty)?:  No.   _____________  Discharge Vitals Blood pressure (!) 123/91, pulse 84, temperature 98.8 F (37.1 C), temperature source Oral, resp. rate 20, height 5\' 10"  (1.778 m), weight (!) 171.6 kg, SpO2 100 %.  Filed Weights   11/10/19 1649 11/11/19 0020  Weight: (!) 176.9 kg (!) 171.6 kg  General: Well developed, well nourished, male in no acute distress Head: Eyes PERRLA, Head normocephalic and atraumatic Lungs: Clear bilaterally to auscultation. Heart: HRRR S1 S2, without rub or gallop.  2-3/6 murmur.  Upper extremity pulses are 2+ & equal. No JVD. Abdomen: Bowel sounds are present, abdomen soft and non-tender without masses or  hernias noted. Msk: Normal strength and tone for age. Extremities: No clubbing, cyanosis or edema.  Calves are very firm to touch.  Patient denies edema, says they are always like that.  Difficult to get lower extremity pulses due to this. Skin:  No rashes or lesions noted. Neuro: Alert and oriented X 3. Psych:  Good affect, responds appropriately   Labs & Radiologic Studies    CBC Recent Labs  11/10/19 1805 11/11/19 0901  WBC 11.1* 10.0  NEUTROABS 9.0*  --   HGB 12.9* 13.6  HCT 39.1 41.2  MCV 90.9 90.4  PLT 233 238   Basic Metabolic Panel Recent Labs    96/78/93 1805 11/11/19 0901  NA 133* 136  K 4.5 4.3  CL 97* 98  CO2 25 28  GLUCOSE 224* 214*  BUN 16 10  CREATININE 0.87 0.80  CALCIUM 9.0 9.5   Liver Function Tests No results for input(s): AST, ALT, ALKPHOS, BILITOT, PROT, ALBUMIN in the last 72 hours. No results for input(s): LIPASE, AMYLASE in the last 72 hours. High Sensitivity Troponin:   Recent Labs  Lab 11/10/19 1805 11/10/19 2121  TROPONINIHS 107* 482*    BNP Invalid input(s):  POCBNP D-Dimer No results for input(s): DDIMER in the last 72 hours. Hemoglobin A1C No results for input(s): HGBA1C in the last 72 hours. Fasting Lipid Panel No results for input(s): CHOL, HDL, LDLCALC, TRIG, CHOLHDL, LDLDIRECT in the last 72 hours. Thyroid Function Tests No results for input(s): TSH, T4TOTAL, T3FREE, THYROIDAB in the last 72 hours.  Invalid input(s): FREET3 _____________  Varney Biles Chest Port 1 View  Result Date: 11/10/2019 CLINICAL DATA:  Found down, syncope EXAM: PORTABLE CHEST 1 VIEW COMPARISON:  07/13/2019 FINDINGS: The heart size and mediastinal contours are within normal limits. Both lungs are clear. The visualized skeletal structures are unremarkable. IMPRESSION: No active disease. Electronically Signed   By: Sharlet Salina M.D.   On: 11/10/2019 20:04   Disposition   Pt is being discharged home today in good condition.  Follow-up Plans & Appointments    Follow-up Information    Purcell Nails, MD Follow up.   Specialty: Cardiothoracic Surgery Why: Keep appointment on March 8 Contact information: [address] [place] [address] [place] [address] [phone]          Discharge Instructions    Diet - low sodium heart healthy   Complete by: As directed    Increase activity slowly   Complete by: As directed       Discharge Medications   Allergies as of 11/11/2019      Reactions   Pollen Extract Other (See Comments)   Runny nose, watery eyes, sneezing      Medication List    TAKE these medications   aspirin EC 81 MG tablet Take 81 mg by mouth every morning.   atorvastatin 40 MG tablet Commonly known as: LIPITOR Take 1 tablet (40 mg total) by mouth daily. What changed: when to take this   Lantus SoloStar 100 UNIT/ML Solostar Pen Generic drug: Insulin Glargine Inject 40 Units into the skin at bedtime.   lisinopril 20 MG tablet Commonly known as: ZESTRIL Take 1 tablet (20 mg total) by mouth daily. What changed: when to take this     metoprolol tartrate 50 MG tablet Commonly known as: LOPRESSOR Take 1 tablet (50 mg total) by mouth 2 (two) times daily.   oxyCODONE-acetaminophen 5-325 MG tablet Commonly known as: Percocet Take one tablet by mouth every 4 hours as needed for pain.   sitaGLIPtin-metformin 50-500 MG tablet Commonly known as: JANUMET Take 1 tablet by mouth 2 (two) times daily with a meal.          Outstanding Labs/Studies   None  Duration of Discharge Encounter   Greater than 30 minutes including physician time.  Signed, Theodore Demark, PA-C 11/11/2019, 10:52 AM

## 2019-11-11 NOTE — Progress Notes (Signed)
ANTICOAGULATION CONSULT NOTE - Initial Consult  Pharmacy Consult for Heparin Indication: chest pain/ACS  Allergies  Allergen Reactions  . Pollen Extract Other (See Comments)    Runny nose, watery eyes, sneezing    Patient Measurements: Height: 5\' 10"  (177.8 cm) Weight: (!) 378 lb 5 oz (171.6 kg) IBW/kg (Calculated) : 73 Heparin Dosing Weight: 115 kg  Vital Signs: Temp: 98.5 F (36.9 C) (02/14 0031) Temp Source: Oral (02/14 0031) BP: 115/88 (02/14 0031) Pulse Rate: 81 (02/14 0031)  Labs: Recent Labs    11/10/19 1805 11/10/19 2121  HGB 12.9*  --   HCT 39.1  --   PLT 233  --   CREATININE 0.87  --   TROPONINIHS 107* 482*    Estimated Creatinine Clearance: 174.1 mL/min (by C-G formula based on SCr of 0.87 mg/dL).   Medical History: Past Medical History:  Diagnosis Date  . Anxiety   . Aortic stenosis   . Cholelithiasis   . Coronary artery disease    non-obstructive CAD (40-50% LAD) 08/2019  . Depression   . Diabetes mellitus   . Dyspnea    per pt upon exertion  . Heart murmur    per pt 10/24/19 dx in 06/2019  . Hypercholesteremia   . Hypertension   . Morbid obesity (HCC)     Medications:  No current facility-administered medications on file prior to encounter.   Current Outpatient Medications on File Prior to Encounter  Medication Sig Dispense Refill  . aspirin EC 81 MG tablet Take 81 mg by mouth every morning.     07/2019 atorvastatin (LIPITOR) 40 MG tablet Take 1 tablet (40 mg total) by mouth daily. (Patient taking differently: Take 40 mg by mouth every morning. ) 90 tablet 1  . Insulin Glargine (LANTUS SOLOSTAR) 100 UNIT/ML Solostar Pen Inject 40 Units into the skin at bedtime. 15 mL   . lisinopril (ZESTRIL) 20 MG tablet Take 1 tablet (20 mg total) by mouth daily. (Patient taking differently: Take 20 mg by mouth every morning. ) 90 tablet 0  . metoprolol tartrate (LOPRESSOR) 50 MG tablet Take 1 tablet (50 mg total) by mouth 2 (two) times daily. 180 tablet 0   . sitaGLIPtin-metformin (JANUMET) 50-500 MG tablet Take 1 tablet by mouth 2 (two) times daily with a meal. 180 tablet 0  . oxyCODONE-acetaminophen (PERCOCET) 5-325 MG tablet Take one tablet by mouth every 4 hours as needed for pain. (Patient not taking: Reported on 11/10/2019) 24 tablet 0    Assessment: 44 y.o. male with syncope and elevated cardiac markers for heparin  Goal of Therapy:  Heparin level 0.3-0.7 units/ml Monitor platelets by anticoagulation protocol: Yes   Plan:  Heparin 5000 units IV bolus, then start heparin 1850 units/hr Check heparin level in 6 hours.   55 11/11/2019,12:54 AM

## 2019-11-11 NOTE — Progress Notes (Signed)
Patient declines use of the low bed due to comfort.  Patient sleeping in the recliner tonight. Patient instructed to call before ambulating and educated on bleeding risk with IV heparin and safety.  Patient agreeable.  Call bell within reach.

## 2019-11-11 NOTE — Progress Notes (Signed)
Patient arrived from [facility].  No complaints of pain.  VSS.  Cardiology notified.

## 2019-11-30 NOTE — Progress Notes (Signed)
[facility] [address], [place] - [address] [place] [address] [place] [address] Phone: [phone] Fax: [phone]      Your procedure is scheduled on Wednesday 12/05/2019.  Report to [facility] Main Entrance "A" at 06:30 A.M., and check in at the Admitting office.  Call this number if you have problems the morning of surgery:  [phone]  Call [phone] if you have any questions prior to your surgery date Monday-Friday 8am-4pm    Remember:  Do not eat or drink after midnight the night before your surgery    Take these medicines the morning of surgery with A SIP OF WATER: Atorvastatin (Lipitor) Lisinopril (Zestril) Metoprolol tartrate (Lopressor)   DO NOT take your aspirin the morning of surgery.  7 days prior to surgery STOP taking any NSAIDs - Aleve, Naproxen, Ibuprofen, Motrin, Advil, Goody's, BC's, all herbal medications, fish oil, and all vitamins.   WHAT DO I DO ABOUT MY DIABETES MEDICATION? Marland Kitchen Do not take oral diabetes medicines (pills) the morning of surgery.  . THE NIGHT BEFORE SURGERY, take 20 units of Lantus insulin.       HOW TO MANAGE YOUR DIABETES BEFORE AND AFTER SURGERY  Why is it important to control my blood sugar before and after surgery? . Improving blood sugar levels before and after surgery helps healing and can limit problems. . A way of improving blood sugar control is eating a healthy diet by: o  Eating less sugar and carbohydrates o  Increasing activity/exercise o  Talking with your doctor about reaching your blood sugar goals . High blood sugars (greater than 180 mg/dL) can raise your risk of infections and slow your recovery, so you will need to focus on controlling your diabetes during the weeks before surgery. . Make sure that the doctor who takes care of your diabetes knows about your planned surgery including the date and location.  How do I manage my blood sugar before surgery? . Check your blood sugar at  least 4 times a day, starting 2 days before surgery, to make sure that the level is not too high or low. . Check your blood sugar the morning of your surgery when you wake up and every 2 hours until you get to the Short Stay unit. o If your blood sugar is less than 70 mg/dL, you will need to treat for low blood sugar: - Do not take insulin. - Treat a low blood sugar (less than 70 mg/dL) with  cup of clear juice (cranberry or apple), 4 glucose tablets, OR glucose gel. - Recheck blood sugar in 15 minutes after treatment (to make sure it is greater than 70 mg/dL). If your blood sugar is not greater than 70 mg/dL on recheck, call [phone] for further instructions. . Report your blood sugar to the short stay nurse when you get to Short Stay.  . If you are admitted to the hospital after surgery: o Your blood sugar will be checked by the staff and you will probably be given insulin after surgery (instead of oral diabetes medicines) to make sure you have good blood sugar levels. o The goal for blood sugar control after surgery is 80-180 mg/dL.     The Morning of Surgery  Do not wear jewelry.  Do not wear lotions, powders, colognes, or deodorant  Do not shave 48 hours prior to surgery.  Men may shave face and neck.  Do not bring valuables to the hospital.  [facility] is not  responsible for any belongings or valuables.  If you are a smoker, DO NOT Smoke 24 hours prior to surgery  If you wear a CPAP at night please bring your mask the morning of surgery   Remember that you must have someone to transport you home after your surgery, and remain with you for 24 hours if you are discharged the same day.   Please bring cases for contacts, glasses, hearing aids, dentures or bridgework because it cannot be worn into surgery.    Leave your suitcase in the car.  After surgery it may be brought to your room.  For patients admitted to the hospital, discharge time will be determined by your treatment  team.  Patients discharged the day of surgery will not be allowed to drive home.    Special instructions:   [place]- Preparing For Surgery  Before surgery, you can play an important role. Because skin is not sterile, your skin needs to be as free of germs as possible. You can reduce the number of germs on your skin by washing with CHG (chlorahexidine gluconate) Soap before surgery.  CHG is an antiseptic cleaner which kills germs and bonds with the skin to continue killing germs even after washing.    Oral Hygiene is also important to reduce your risk of infection.  Remember - BRUSH YOUR TEETH THE MORNING OF SURGERY WITH YOUR REGULAR TOOTHPASTE  Please do not use if you have an allergy to CHG or antibacterial soaps. If your skin becomes reddened/irritated stop using the CHG.  Do not shave (including legs and underarms) for at least 48 hours prior to first CHG shower. It is OK to shave your face.  Please follow these instructions carefully.   1. Shower the NIGHT BEFORE SURGERY and the MORNING OF SURGERY with CHG Soap.   2. If you chose to wash your hair, wash your hair first as usual with your normal shampoo.  3. After you shampoo, rinse your hair and body thoroughly to remove the shampoo.  4. Use CHG as you would any other liquid soap. You can apply CHG directly to the skin and wash gently with a scrungie or a clean washcloth.   5. Apply the CHG Soap to your body ONLY FROM THE NECK DOWN.  Do not use on open wounds or open sores. Avoid contact with your eyes, ears, mouth and genitals (private parts). Wash Face and genitals (private parts)  with your normal soap.   6. Wash thoroughly, paying special attention to the area where your surgery will be performed.  7. Thoroughly rinse your body with warm water from the neck down.  8. DO NOT shower/wash with your normal soap after using and rinsing off the CHG Soap.  9. Pat yourself dry with a CLEAN TOWEL.  10. Wear CLEAN PAJAMAS to bed  the night before surgery, wear comfortable clothes the morning of surgery  11. Place CLEAN SHEETS on your bed the night of your first shower and DO NOT SLEEP WITH PETS.    Day of Surgery:  Please shower the morning of surgery with the CHG soap Do not apply any deodorants/lotions. Please wear clean clothes to the hospital/surgery center.   Remember to brush your teeth WITH YOUR REGULAR TOOTHPASTE.   Please read over the following fact sheets that you were given.

## 2019-12-03 ENCOUNTER — Ambulatory Visit (HOSPITAL_COMMUNITY)
Admission: RE | Admit: 2019-12-03 | Discharge: 2019-12-03 | Disposition: A | Discharge status: Home / Self Care | Payer: Self-pay | Source: Ambulatory Visit | Attending: Thoracic Surgery (Cardiothoracic Vascular Surgery) | Admitting: Thoracic Surgery (Cardiothoracic Vascular Surgery)

## 2019-12-03 ENCOUNTER — Encounter: Payer: Self-pay | Admitting: Thoracic Surgery (Cardiothoracic Vascular Surgery)

## 2019-12-03 ENCOUNTER — Encounter (HOSPITAL_COMMUNITY)
Admission: RE | Admit: 2019-12-03 | Discharge: 2019-12-03 | Disposition: A | Discharge status: Home / Self Care | Payer: Self-pay | Source: Ambulatory Visit | Attending: Thoracic Surgery (Cardiothoracic Vascular Surgery) | Admitting: Thoracic Surgery (Cardiothoracic Vascular Surgery)

## 2019-12-03 ENCOUNTER — Encounter (HOSPITAL_COMMUNITY): Payer: Self-pay

## 2019-12-03 ENCOUNTER — Ambulatory Visit (INDEPENDENT_AMBULATORY_CARE_PROVIDER_SITE_OTHER): Payer: Self-pay | Admitting: Thoracic Surgery (Cardiothoracic Vascular Surgery)

## 2019-12-03 ENCOUNTER — Other Ambulatory Visit (HOSPITAL_COMMUNITY)
Admission: RE | Admit: 2019-12-03 | Discharge: 2019-12-03 | Disposition: A | Discharge status: Home / Self Care | Payer: Self-pay | Source: Ambulatory Visit | Attending: Thoracic Surgery (Cardiothoracic Vascular Surgery) | Admitting: Thoracic Surgery (Cardiothoracic Vascular Surgery)

## 2019-12-03 ENCOUNTER — Other Ambulatory Visit: Payer: Self-pay

## 2019-12-03 VITALS — BP 134/83 | HR 76 | Temp 97.9°F | Resp 20 | Ht 70.0 in | Wt 382.0 lb

## 2019-12-03 DIAGNOSIS — I10 Essential (primary) hypertension: Secondary | ICD-10-CM | POA: Insufficient documentation

## 2019-12-03 DIAGNOSIS — I350 Nonrheumatic aortic (valve) stenosis: Secondary | ICD-10-CM | POA: Insufficient documentation

## 2019-12-03 DIAGNOSIS — Z20822 Contact with and (suspected) exposure to covid-19: Secondary | ICD-10-CM | POA: Insufficient documentation

## 2019-12-03 DIAGNOSIS — I739 Peripheral vascular disease, unspecified: Secondary | ICD-10-CM | POA: Insufficient documentation

## 2019-12-03 DIAGNOSIS — Z794 Long term (current) use of insulin: Secondary | ICD-10-CM | POA: Insufficient documentation

## 2019-12-03 DIAGNOSIS — E119 Type 2 diabetes mellitus without complications: Secondary | ICD-10-CM | POA: Insufficient documentation

## 2019-12-03 DIAGNOSIS — E785 Hyperlipidemia, unspecified: Secondary | ICD-10-CM | POA: Insufficient documentation

## 2019-12-03 DIAGNOSIS — Z01818 Encounter for other preprocedural examination: Secondary | ICD-10-CM | POA: Insufficient documentation

## 2019-12-03 LAB — URINALYSIS, ROUTINE W REFLEX MICROSCOPIC
Bilirubin Urine: NEGATIVE
Glucose, UA: NEGATIVE mg/dL
Hgb urine dipstick: NEGATIVE
Ketones, ur: NEGATIVE mg/dL
Leukocytes,Ua: NEGATIVE
Nitrite: NEGATIVE
Protein, ur: NEGATIVE mg/dL
Specific Gravity, Urine: 1.006 (ref 1.005–1.030)
pH: 6 (ref 5.0–8.0)

## 2019-12-03 LAB — CBC
HCT: 42.1 % (ref 39.0–52.0)
Hemoglobin: 13.8 g/dL (ref 13.0–17.0)
MCH: 29.5 pg (ref 26.0–34.0)
MCHC: 32.8 g/dL (ref 30.0–36.0)
MCV: 90 fL (ref 80.0–100.0)
Platelets: 249 10*3/uL (ref 150–400)
RBC: 4.68 MIL/uL (ref 4.22–5.81)
RDW: 12.9 % (ref 11.5–15.5)
WBC: 11.5 10*3/uL — ABNORMAL HIGH (ref 4.0–10.5)
nRBC: 0 % (ref 0.0–0.2)

## 2019-12-03 LAB — COMPREHENSIVE METABOLIC PANEL
ALT: 27 U/L (ref 0–44)
AST: 23 U/L (ref 15–41)
Albumin: 3.7 g/dL (ref 3.5–5.0)
Alkaline Phosphatase: 49 U/L (ref 38–126)
Anion gap: 12 (ref 5–15)
BUN: 13 mg/dL (ref 6–20)
CO2: 24 mmol/L (ref 22–32)
Calcium: 9.5 mg/dL (ref 8.9–10.3)
Chloride: 102 mmol/L (ref 98–111)
Creatinine, Ser: 0.82 mg/dL (ref 0.61–1.24)
GFR calc Af Amer: >60 mL/min (ref >60)
GFR calc non Af Amer: >60 mL/min (ref >60)
Glucose, Bld: 113 mg/dL — ABNORMAL HIGH (ref 70–99)
Potassium: 4.2 mmol/L (ref 3.5–5.1)
Sodium: 138 mmol/L (ref 135–145)
Total Bilirubin: 1.2 mg/dL (ref 0.3–1.2)
Total Protein: 7.4 g/dL (ref 6.5–8.1)

## 2019-12-03 LAB — GLUCOSE, CAPILLARY: Glucose-Capillary: 126 mg/dL — ABNORMAL HIGH (ref 70–99)

## 2019-12-03 LAB — TYPE AND SCREEN
ABO/RH(D): A POS
Antibody Screen: NEGATIVE

## 2019-12-03 LAB — ABO/RH: ABO/RH(D): A POS

## 2019-12-03 LAB — SURGICAL PCR SCREEN
MRSA, PCR: NEGATIVE
Staphylococcus aureus: NEGATIVE

## 2019-12-03 LAB — HEMOGLOBIN A1C
Hgb A1c MFr Bld: 7.2 % — ABNORMAL HIGH (ref 4.8–5.6)
Mean Plasma Glucose: 159.94 mg/dL

## 2019-12-03 LAB — APTT: aPTT: 33 seconds (ref 24–36)

## 2019-12-03 LAB — PROTIME-INR
INR: 1 (ref 0.8–1.2)
Prothrombin Time: 13.5 seconds (ref 11.4–15.2)

## 2019-12-03 LAB — SARS CORONAVIRUS 2 (TAT 6-24 HRS): SARS Coronavirus 2: NEGATIVE

## 2019-12-03 NOTE — Progress Notes (Signed)
PCP - Jacquelin Hawking, PA Cardiologist - Dr. Darl Householder, per patient Dr. Excell Seltzer  PPM/ICD - n/a Device Orders -  Rep Notified -   Chest x-ray - 12/03/2019 EKG - 11/12/2019 Stress Test -  ECHO -  06/12/2019 Cardiac Cath - 09/24/2019  Sleep Study - n/a CPAP -   Fasting Blood Sugar - 120-140 Checks Blood Sugar 2 times a day  Blood Thinner Instructions: Aspirin Instructions: continue just do not take DOS  ERAS Protcol - n/a PRE-SURGERY Ensure or G2-   COVID TEST- 12/03/2019   Anesthesia review: yes, cardiac history  Patient denies shortness of breath, fever, cough and chest pain at PAT appointment   All instructions explained to the patient, with a verbal understanding of the material. Patient agrees to go over the instructions while at home for a better understanding. Patient also instructed to self quarantine after being tested for COVID-19. The opportunity to ask questions was provided.

## 2019-12-03 NOTE — Progress Notes (Signed)
[address]       [place] [address]             [phone]     CARDIOTHORACIC SURGERY OFFICE NOTE  Referring Provider is Herminio Commons, MD PCP is Soyla Dryer, PA-C   HPI:  Patient is a 44 year old morbidly obese male with history of likely bicuspid aortic valve with aortic stenosis,hypertension, type 2 diabetes mellitus, anxiety and depression who returns the office today with plans to proceed with aortic valve replacement later this week for severe symptomatic aortic stenosis.  He was last seen here in our office on November 05, 2019.  On November 10, 2019 the patient suffered a syncopal episode at work which occurred in the setting of prolonged strenuous exertion.  The patient was observed overnight in the hospital but I was not personally contacted and our service was not consulted for follow-up.  Patient returns her office today and reports that otherwise he has done fine.  Since that event he has not been exerting himself at all.  He denies any fevers, chills, or productive cough.  He has not had any chest pains.  He has not had any further dizzy spells.   Current Outpatient Medications  Medication Sig Dispense Refill   aspirin EC 81 MG tablet Take 81 mg by mouth every morning.      atorvastatin (LIPITOR) 40 MG tablet Take 1 tablet (40 mg total) by mouth daily. (Patient taking differently: Take 40 mg by mouth every morning. ) 90 tablet 1   Insulin Glargine (LANTUS SOLOSTAR) 100 UNIT/ML Solostar Pen Inject 40 Units into the skin at bedtime. 15 mL    lisinopril (ZESTRIL) 20 MG tablet Take 1 tablet (20 mg total) by mouth daily. (Patient taking differently: Take 20 mg by mouth every morning. ) 90 tablet 0   metoprolol tartrate (LOPRESSOR) 50 MG tablet Take 1 tablet (50 mg total) by mouth 2 (two) times daily. 180 tablet 0   oxyCODONE-acetaminophen (PERCOCET) 5-325 MG tablet Take one tablet by mouth every 4 hours as needed for pain. 24 tablet 0    sitaGLIPtin-metformin (JANUMET) 50-500 MG tablet Take 1 tablet by mouth 2 (two) times daily with a meal. 180 tablet 0   No current facility-administered medications for this visit.      Physical Exam:   BP 134/83 (BP Location: Right Arm, Patient Position: Sitting, Cuff Size: Large)    Pulse 76    Temp 97.9 F (36.6 C) (Temporal)    Resp 20    Ht 5\' 10"  (1.778 m)    Wt (!) 382 lb (173.3 kg)    SpO2 94% Comment: RA   BMI 54.81 kg/m   General:  Morbidly obese but well-appearing  Chest:   Clear to auscultation  CV:   Regular rate and rhythm with prominent systolic murmur  Incisions:  n/a  Abdomen:  Soft nontender  Extremities:  Warm and well-perfused  Diagnostic Tests:  n/a   Impression:  Patient has bicuspid aortic valve with stage D severe symptomatic aortic stenosis.  He describes stable symptoms of exertional shortness of breath consistent with chronic diastolic congestive heart failure, New York Heart Association functional class IIb.    In addition, he recently suffered a syncopal episode that was preceded by more strenuous physical exertion.  I have personally reviewed the patient's most recent transthoracic echocardiogram, diagnostic cardiac catheterization, twelve-lead EKG, and CT angiograms.  Transthoracic echocardiogram demonstrates severe aortic stenosis with preserved left ventricular  systolic function.  Acoustic windows were relatively poor quality but the entire aortic valve is notable for heavily calcified and thickened leaflets with severely restricted leaflet mobility.  Peak velocity across aortic valve measured 4.5 m/s corresponding to mean transvalvular gradient estimated close to 50 mmHg.  Left ventricular systolic function remains normal.  Diagnostic cardiac catheterization reveals nonobstructive coronary artery disease with moderate stenosis in the mid left anterior descending coronary artery that does not appear to be flow-limiting.  There are no other areas of  significant plaque identified.  Transvalvular gradient was not assessed but the patient was noted to have severe pulmonary hypertension.  Cardiac gated CT angiogram of the heart was relatively poor quality and limited because of patient's body habitus.  Precise quantification of the size of the aortic annulus is subsequently challenging, but overall the annulus size is relatively large.  The patient appears to have a Sievers type 0 bicuspid aortic valve and there is heavy calcification involving the leaflets that is somewhat asymmetrical.  There is mild ectasia of the ascending aorta measuring 4.3 cm in its greatest transverse diameter.  EKG reveals sinus rhythm with no significant AV conduction delay.   Plan:  The patient was again counseled at length regarding treatment alternatives for the management of severe symptomatic aortic stenosis. Alternative approaches such as conventional aortic valve replacement, transcatheter aortic valve replacement, and continued medical therapy without intervention were compared and contrasted at length.  Surgical options were discussed at length including conventional surgical aortic valve replacement through either a full median sternotomy or using minimally invasive techniques.  Other alternatives including rapid-deployment bioprosthetic tissue valve replacement, transcatheter aortic valve replacement, patch enlargement of the aortic root, stentless porcine aortic root replacement, valve repair, the Ross autograft procedure, and homograft aortic root replacement were discussed.  Discussion was held comparing the relative risks of mechanical valve replacement with need for lifelong anticoagulation versus use of a bioprosthetic tissue valve and the associated potential for late structural valve deterioration and failure.  This discussion was placed in the context of the patient's particular circumstances, and as a result the patient specifically requests that their valve be  replaced using a mechanical prosthetic valve.  The patient understands and accepts all potential associated risks of surgery including but not limited to risk of death, stroke, myocardial infarction, congestive heart failure, respiratory failure, renal failure, pneumonia, bleeding requiring blood transfusion and or reexploration, arrhythmia, heart block or bradycardia requiring permanent pacemaker, aortic dissection or other major vascular complication, pleural effusions or other delayed complications related to continued congestive heart failure, and other late complications related to valve replacement including structural valve deterioration and failure, thrombosis, endocarditis, or paravalvular leak.  We plan to proceed with surgery on Wednesday, December 05, 2019.     I spent in excess of 15 minutes during the conduct of this office consultation and >50% of this time involved direct face-to-face encounter with the patient for counseling and/or coordination of their care.    Salvatore Decent. Cornelius Moras, MD 12/03/2019 1:42 PM

## 2019-12-03 NOTE — Progress Notes (Signed)
VASCULAR LAB PRELIMINARY  PRELIMINARY  PRELIMINARY  PRELIMINARY  Pre CABG Dopplers completed.    Preliminary report:  See CV proc for preliminary results.   Erion Weightman, RVT 12/03/2019, 10:29 AM

## 2019-12-03 NOTE — Patient Instructions (Signed)
Stop taking aspirin  Continue taking all other medications without change through the day before surgery.  Make sure to bring all of your medications with you when you come for your Pre-Admission Testing appointment at [facility] Short-Stay Department.  Have nothing to eat or drink after midnight the night before surgery.  On the morning of surgery do not take any medications  At your appointment for Pre-Admission Testing at the [facility] Short-Stay Department you will be asked to sign permission forms for your upcoming surgery.  By definition your signature on these forms implies that you and/or your designee provide full informed consent for your planned surgical procedure(s), that alternative treatment options have been discussed, that you understand and accept any and all potential risks, and that you have some understanding of what to expect for your post-operative convalescence.  For any major cardiac surgical procedure potential operative risks include but are not limited to at least some risk of death, stroke or other neurologic complication, myocardial infarction, congestive heart failure, respiratory failure, renal failure, bleeding requiring blood transfusion and/or reexploration, irregular heart rhythm, heart block or bradycardia requiring permanent pacemaker, pneumonia, pericardial effusion, pleural effusion, wound infection, pulmonary embolus or other thromboembolic complication, chronic pain, or other complications related to the specific procedure(s) performed.  Please call to schedule a follow-up appointment in our office prior to surgery if you have any unresolved questions about your planned surgical procedure, the associated risks, alternative treatment options, and/or expectations for your post-operative recovery.

## 2019-12-03 NOTE — Anesthesia Preprocedure Evaluation (Addendum)
Anesthesia Evaluation  Patient identified by MRN, date of birth, ID band Patient awake    Reviewed: Allergy & Precautions, NPO status , Patient's Chart, lab work & pertinent test results  History of Anesthesia Complications Negative for: history of anesthetic complications  Airway Mallampati: III  TM Distance: >3 FB Neck ROM: Full    Dental no notable dental hx. (+) Dental Advisory Given   Pulmonary sleep apnea ,    Pulmonary exam normal        Cardiovascular hypertension, + CAD  + Valvular Problems/Murmurs AS  Rhythm:Regular Rate:Normal + Systolic murmurs IMPRESSIONS  1. The left ventricle has normal systolic function, with an ejection fraction of 55-60%. The cavity size was normal. There is mildly increased left ventricular wall thickness. Left ventricular diastolic Doppler parameters are consistent with  pseudonormalization. Elevated mean left atrial pressure.  2. The right ventricle has normal systolic function. The cavity was normal. There is no increase in right ventricular wall thickness.  3. The aortic valve has an indeterminate number of cusps. Severely thickening of the aortic valve. Severe calcifcation of the aortic valve. Severe stenosis of the aortic valve. Severe aortic annular calcification noted.  4. The mitral valve is abnormal. Moderate thickening of the mitral valve leaflet. Moderate calcification of the mitral valve leaflet. There is moderate mitral annular calcification present. No evidence of mitral valve stenosis.  5. The aorta is normal unless otherwise noted.  6. The aortic root is normal in size and structure.  7. Pulmonary hypertension is indeterminant, inadequate TR jet.  8. The interatrial septum was not well visualized.    Neuro/Psych Anxiety Depression negative neurological ROS     GI/Hepatic negative GI ROS, Neg liver ROS,   Endo/Other  diabetesMorbid obesity  Renal/GU negative Renal ROS      Musculoskeletal negative musculoskeletal ROS (+)   Abdominal   Peds  Hematology negative hematology ROS (+)   Anesthesia Other Findings Day of surgery medications reviewed with the patient.  Reproductive/Obstetrics                          Anesthesia Physical Anesthesia Plan  ASA: IV  Anesthesia Plan: General   Post-op Pain Management:    Induction: Intravenous  PONV Risk Score and Plan: 3 and Ondansetron, Dexamethasone and Midazolam  Airway Management Planned: Oral ETT  Additional Equipment: Arterial line, PA Cath, 3D TEE and Ultrasound Guidance Line Placement  Intra-op Plan:   Post-operative Plan: Post-operative intubation/ventilation  Informed Consent: I have reviewed the patients History and Physical, chart, labs and discussed the procedure including the risks, benefits and alternatives for the proposed anesthesia with the patient or authorized representative who has indicated his/her understanding and acceptance.     Dental advisory given  Plan Discussed with: Anesthesiologist  Anesthesia Plan Comments: ( Per Dr. Cornelius Moras: No neck lines on left )      Anesthesia Quick Evaluation

## 2019-12-04 MED ORDER — TRANEXAMIC ACID (OHS) BOLUS VIA INFUSION
15.0000 mg/kg | INTRAVENOUS | Status: AC
  Administered 2019-12-05: 09:00:00 2599.5 mg via INTRAVENOUS
  Filled 2019-12-04: qty 2600

## 2019-12-04 MED ORDER — VANCOMYCIN HCL 1500 MG/300ML IV SOLN
1500.0000 mg | INTRAVENOUS | Status: AC
  Administered 2019-12-05: 09:00:00 1500 mg via INTRAVENOUS
  Filled 2019-12-04: qty 300

## 2019-12-04 MED ORDER — NITROGLYCERIN IN D5W 200-5 MCG/ML-% IV SOLN
2.0000 ug/min | INTRAVENOUS | Status: DC
  Filled 2019-12-04: qty 250

## 2019-12-04 MED ORDER — TRANEXAMIC ACID 1000 MG/10ML IV SOLN
1.5000 mg/kg/h | INTRAVENOUS | Status: DC
  Filled 2019-12-04: qty 25

## 2019-12-04 MED ORDER — DEXMEDETOMIDINE HCL IN NACL 400 MCG/100ML IV SOLN
0.1000 ug/kg/h | INTRAVENOUS | Status: AC
  Administered 2019-12-05: .3 ug/kg/h via INTRAVENOUS
  Filled 2019-12-04: qty 100

## 2019-12-04 MED ORDER — POTASSIUM CHLORIDE 2 MEQ/ML IV SOLN
80.0000 meq | INTRAVENOUS | Status: DC
  Filled 2019-12-04: qty 40

## 2019-12-04 MED ORDER — VANCOMYCIN HCL 1000 MG IV SOLR
INTRAVENOUS | Status: AC
  Administered 2019-12-05: 1000 mL
  Filled 2019-12-04: qty 1000

## 2019-12-04 MED ORDER — PLASMA-LYTE 148 IV SOLN
INTRAVENOUS | Status: AC
  Administered 2019-12-05: 500 mL
  Filled 2019-12-04: qty 2.5

## 2019-12-04 MED ORDER — MILRINONE LACTATE IN DEXTROSE 20-5 MG/100ML-% IV SOLN
0.3000 ug/kg/min | INTRAVENOUS | Status: DC
  Filled 2019-12-04: qty 100

## 2019-12-04 MED ORDER — SODIUM CHLORIDE 0.9 % IV SOLN
1.5000 g | INTRAVENOUS | Status: AC
  Administered 2019-12-05: 09:00:00 1.5 g via INTRAVENOUS
  Filled 2019-12-04: qty 1.5

## 2019-12-04 MED ORDER — MANNITOL 20 % IV SOLN
Freq: Once | INTRAVENOUS | Status: DC
  Filled 2019-12-04: qty 13

## 2019-12-04 MED ORDER — NOREPINEPHRINE 4 MG/250ML-% IV SOLN
0.0000 ug/min | INTRAVENOUS | Status: DC
  Filled 2019-12-04: qty 250

## 2019-12-04 MED ORDER — PHENYLEPHRINE HCL-NACL 20-0.9 MG/250ML-% IV SOLN
30.0000 ug/min | INTRAVENOUS | Status: DC
  Filled 2019-12-04: qty 250

## 2019-12-04 MED ORDER — EPINEPHRINE HCL 5 MG/250ML IV SOLN IN NS
0.0000 ug/min | INTRAVENOUS | Status: DC
  Filled 2019-12-04: qty 250

## 2019-12-04 MED ORDER — TRANEXAMIC ACID 1000 MG/10ML IV SOLN
1.5000 mg/kg/h | INTRAVENOUS | Status: AC
  Administered 2019-12-05: 1.5 mg/kg/h via INTRAVENOUS
  Filled 2019-12-04: qty 25

## 2019-12-04 MED ORDER — INSULIN REGULAR(HUMAN) IN NACL 100-0.9 UT/100ML-% IV SOLN
INTRAVENOUS | Status: AC
  Administered 2019-12-05: 5.5 [IU]/h via INTRAVENOUS
  Filled 2019-12-04: qty 100

## 2019-12-04 MED ORDER — SODIUM CHLORIDE 0.9 % IV SOLN
750.0000 mg | INTRAVENOUS | Status: DC
  Filled 2019-12-04: qty 750

## 2019-12-04 MED ORDER — SODIUM CHLORIDE 0.9 % IV SOLN
INTRAVENOUS | Status: DC
  Filled 2019-12-04: qty 30

## 2019-12-04 MED ORDER — TRANEXAMIC ACID (OHS) PUMP PRIME SOLUTION
2.0000 mg/kg | INTRAVENOUS | Status: DC
  Filled 2019-12-04: qty 3.47

## 2019-12-04 NOTE — H&P (Signed)
[address]       [name] [address]             [phone]          CARDIOTHORACIC SURGERY HISTORY AND PHYSICAL EXAM  Referring Provider is Laqueta Linden, MD PCP is Jacquelin [facility]king, PA-C   Chief Complaint  Patient presents with  . Aortic Stenosis    TAVR eval ...review required studies/procedures...needs dental procedures before surgery    HPI:  Patient is a 44 year old morbidly obese male with history of likely bicuspid aortic valve with aortic stenosis, hypertension, type 2 diabetes mellitus, anxiety and depression who has been referred for surgical consultation to discuss treatment options for management of severe symptomatic aortic stenosis.  Patient states that he was first told that he had a heart murmur when he was hospitalized in September 2020 for cellulitis involving his left foot.  Transthoracic echocardiogram performed at that time revealed likely bicuspid aortic valve with severe aortic stenosis.  Peak velocity across aortic valve measured 4.5 m/s corresponding to mean transvalvular gradient estimated 49.5 mmHg.  No aortic insufficiency was noted and the DVI was reported 0.24.  Left ventricular systolic function appeared normal with ejection fraction estimated 55 to 60%.  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  Once he completely recovered he was referred to the multidisciplinary heart valve clinic where he has been seen previously by Dr. Excell Seltzer.  Catheterization revealed mild nonobstructive coronary artery disease with exception of moderate stenosis of the mid left anterior descending coronary artery.  There was severe pulmonary hypertension.  CT angiography has been performed and the patient was referred for surgical consultation.  Patient is single and lives alone near [facility]  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  He states that he is limited physically by significant exertional shortness of breath.  He gets short of breath with moderate level activity.  He denies resting shortness of breath, PND, palpitations, dizzy spells, or syncope.  He has not experienced exertional chest discomfort.  He states that he cannot lay flat in bed and he suspects that he may have sleep apnea.  Patient is a 44 year old morbidly obese male with history of likely bicuspid aortic valve with aortic stenosis,hypertension, type 2 diabetes mellitus, anxiety and depression who returns the office today with plans to proceed with aortic valve replacement later this week for severe symptomatic aortic stenosis.  He was last seen here in our office on November 05, 2019.  On November 10, 2019 the patient suffered a syncopal episode at work which occurred in the setting of prolonged strenuous exertion.  The patient was observed overnight in the hospital but I was not personally contacted and our service was not consulted for follow-up.  Patient returns her office today and reports that otherwise he has done fine.  Since that event he has not been exerting himself at all.  He denies any fevers, chills, or productive cough.  He has not had any chest pains.  He has not had any further dizzy spells.   Past Medical History:  Diagnosis Date  . Anxiety   . Aortic stenosis   . Cholelithiasis   . Coronary artery disease    non-obstructive CAD (40-50% LAD) 08/2019  . Depression   . Diabetes mellitus   . Dyspnea    per pt upon  exertion  . Heart murmur    per pt 10/24/19 dx in 06/2019  . Hypercholesteremia   . Hypertension   . Morbid obesity (HCC)     Past Surgical History:  Procedure Laterality Date  . ABDOMINAL AORTOGRAM W/LOWER EXTREMITY N/A 07/06/2019   Procedure: ABDOMINAL AORTOGRAM W/LOWER EXTREMITY;  Surgeon: Sherren Kerns, MD;  Location: [facility] INVASIVE CV LAB;  Service: Cardiovascular;   Laterality: N/A;  Bilateral  . AMPUTATION Left 07/09/2019   Procedure: LEFT FOURTH and Fifth TOE AMPUTATION.;  Surgeon: Larina Earthly, MD;  Location: [facility] OR;  Service: Vascular;  Laterality: Left;  . IR RADIOLOGY PERIPHERAL GUIDED IV START  10/05/2019  . IR US GUIDE VASC ACCESS RIGHT  10/05/2019  . MULTIPLE EXTRACTIONS WITH ALVEOLOPLASTY N/A 10/26/2019   Procedure: EXTRACTION OF TOOTH #'S 3, 5-11,19-28,  AND 32 WITH ALVEOLOPLASTY;  Surgeon: Charlynne Pander, DDS;  Location: [facility] OR;  Service: Oral Surgery;  Laterality: N/A;  . RIGHT HEART CATH AND CORONARY ANGIOGRAPHY N/A 09/24/2019   Procedure: RIGHT HEART CATH AND CORONARY ANGIOGRAPHY;  Surgeon: Tonny Bollman, MD;  Location: [facility] INVASIVE CV LAB;  Service: Cardiovascular;  Laterality: N/A;    Family History  Problem Relation Age of Onset  . Cancer Mother        brain  . Heart disease Father   . Hyperlipidemia Father   . Hypertension Father   . Stroke Father   . Heart murmur Sister     Social History Social History   Tobacco Use  . Smoking status: Never Smoker  . Smokeless tobacco: Former Neurosurgeon    Types: Chew  Substance Use Topics  . Alcohol use: Not Currently    Comment: occasionally  . Drug use: No    Prior to Admission medications   Medication Sig Start Date End Date Taking? Authorizing Provider  aspirin EC 81 MG tablet Take 81 mg by mouth every morning.     [provider]  atorvastatin (LIPITOR) 40 MG tablet Take 1 tablet (40 mg total) by mouth daily. Patient taking differently: Take 40 mg by mouth every morning.  10/23/19   Jacquelin Hawking, PA-C  Insulin Glargine (LANTUS SOLOSTAR) 100 UNIT/ML Solostar Pen Inject 40 Units into the skin at bedtime. 10/25/19   Roma Kayser, MD  lisinopril (ZESTRIL) 20 MG tablet Take 1 tablet (20 mg total) by mouth daily. Patient taking differently: Take 20 mg by mouth every morning.  08/29/19   Jacquelin Hawking, PA-C  metoprolol tartrate (LOPRESSOR) 50 MG tablet Take 1 tablet  (50 mg total) by mouth 2 (two) times daily. 08/29/19   Jacquelin Hawking, PA-C  oxyCODONE-acetaminophen (PERCOCET) 5-325 MG tablet Take one tablet by mouth every 4 hours as needed for pain. 10/26/19   Charlynne Pander, DDS  sitaGLIPtin-metformin (JANUMET) 50-500 MG tablet Take 1 tablet by mouth 2 (two) times daily with a meal. 08/29/19   Jacquelin Hawking, PA-C    Allergies  Allergen Reactions  . Pollen Extract Other (See Comments)    Runny nose, watery eyes, sneezing      Review of Systems:              General:                      normal appetite, decreased energy, no weight gain, no weight loss, no fever             Cardiac:  no chest pain with exertion, no chest pain at rest, +SOB with exertion, no resting SOB, no PND, + orthopnea, no palpitations, no arrhythmia, no atrial fibrillation, no LE edema, no dizzy spells, no syncope             Respiratory:                 + shortness of breath, no home oxygen, no productive cough, no dry cough, no bronchitis, no wheezing, no hemoptysis, no asthma, no pain with inspiration or cough, + suspected sleep apnea but never tested, no CPAP at night             GI:                               no difficulty swallowing, no reflux, no frequent heartburn, no hiatal hernia, no abdominal pain, no constipation, no diarrhea, no hematochezia, no hematemesis, no melena             GU:                              no dysuria,  no frequency, no urinary tract infection, no hematuria, no enlarged prostate, no kidney stones, no kidney disease             Vascular:                     no pain suggestive of claudication, no pain in feet, no leg cramps, no varicose veins, no DVT, no non-healing foot ulcer             Neuro:                         no stroke, no TIA's, no seizures, no headaches, no temporary blindness one eye,  no slurred speech, no peripheral neuropathy, no chronic pain, no instability of gait, no memory/cognitive dysfunction              Musculoskeletal:         no arthritis, no joint swelling, no myalgias, no difficulty walking, normal mobility              Skin:                            no rash, no itching, no skin infections, no pressure sores or ulcerations             Psych:                         + anxiety, + depression, + nervousness, + unusual recent stress             Eyes:                           no blurry vision, no floaters, no recent vision changes, + wears glasses or contacts             ENT:                            no hearing loss, no loose or painful teeth, no dentures, last saw dentist recently - needs dental extraction  Hematologic:               no easy bruising, no abnormal bleeding, no clotting disorder, no frequent epistaxis             Endocrine:                   + diabetes, does  check CBG's at home                                                       Physical Exam:              BP 121/77 (BP Location: Left Arm, Patient Position: Sitting, Cuff Size: Large)   Pulse 83   Temp (!) 97.3 F (36.3 C)   Resp 16   Ht  (1.778 m)   Wt (!) 372 lb (168.7 kg)   SpO2 98% Comment: RA  BMI 53.38 kg/m              General:                      Morbidly obese male NAD              HEENT:                       Unremarkable              Neck:                           no JVD, no bruits, no adenopathy              Chest:                          clear to auscultation, symmetrical breath sounds, no wheezes, no rhonchi              CV:                              RRR, grade III/VI crescendo/decrescendo murmur heard best at RSB,  no diastolic murmur             Abdomen:                    soft, non-tender, no masses              Extremities:                 warm, well-perfused, pulses diminished, no LE edema             Rectal/GU                   Deferred             Neuro:                         Grossly non-focal and symmetrical throughout             Skin:  Clean and dry, no rashes, no breakdown   Diagnostic Tests:  ECHOCARDIOGRAM REPORT  Patient Name: RJAY REVOLORIO Date of Exam: 06/12/2019  Medical Rec #: [id number] Height: 71.0 in  Accession #: [id number] Weight: 377.9 lb  Date of Birth: April 12, 1976 BSA: 2.76 m  Patient Age: 74 years BP: 123/69 mmHg  Patient Gender: M HR: 91 bpm.  Exam Location: [facility]  Procedure: 2D Echo, Cardiac Doppler and Color Doppler  Indications: Syncope 780.2 / R55  History: Patient has no prior history of Echocardiogram examinations.  Risk Factors: Hypertension, Diabetes and Dyslipidemia. Morbid  obesity.  Sonographer: Celesta Gentile RCS  Referring Phys: [phone] COURAGE EMOKPAE  IMPRESSIONS  1. The left ventricle has normal systolic function, with an ejection fraction of 55-60%. The cavity size was normal. There is mildly increased left ventricular wall thickness. Left ventricular diastolic Doppler parameters are consistent with  pseudonormalization. Elevated mean left atrial pressure.  2. The right ventricle has normal systolic function. The cavity was normal. There is no increase in right ventricular wall thickness.  3. The aortic valve has an indeterminate number of cusps. Severely thickening of the aortic valve. Severe calcifcation of the aortic valve. Severe stenosis of the aortic valve. Severe aortic annular calcification noted.  4. The mitral valve is abnormal. Moderate thickening of the mitral valve leaflet. Moderate calcification of the mitral valve leaflet. There is moderate mitral annular calcification present. No evidence of mitral valve stenosis.  5. The aorta is normal unless otherwise noted.  6. The aortic root is normal in size and structure.  7. Pulmonary hypertension is indeterminant, inadequate TR jet.  8. The interatrial septum was not well visualized.  FINDINGS  Left Ventricle: The left ventricle has normal systolic function, with an ejection fraction of 55-60%. The cavity size was  normal. There is mildly increased left ventricular wall thickness. Left ventricular diastolic Doppler parameters are consistent  with pseudonormalization. Elevated mean left atrial pressure Definity contrast agent was given IV to delineate the left ventricular endocardial borders.  Right Ventricle: The right ventricle has normal systolic function. The cavity was normal. There is no increase in right ventricular wall thickness.  Left Atrium: Left atrial size was normal in size.  Right Atrium: Right atrial size was normal in size. Right atrial pressure is estimated at 10 mmHg.  Interatrial Septum: The interatrial septum was not well visualized.  Pericardium: There is no evidence of pericardial effusion.  Mitral Valve: The mitral valve is abnormal. Moderate thickening of the mitral valve leaflet. Moderate calcification of the mitral valve leaflet. There is moderate mitral annular calcification present. Mitral valve regurgitation is not visualized by color  flow Doppler. No evidence of mitral valve stenosis.  Tricuspid Valve: The tricuspid valve is normal in structure. Tricuspid valve regurgitation was not visualized by color flow Doppler.  Aortic Valve: The aortic valve has an indeterminate number of cusps Severely thickening of the aortic valve. Severe calcifcation of the aortic valve. Aortic valve regurgitation was not visualized by color flow Doppler. There is Severe stenosis of the  aortic valve, with a calculated valve area of 1.01 cm. Severe aortic annular calcification noted.  Pulmonic Valve: The pulmonic valve was not well visualized. Pulmonic valve regurgitation is not visualized by color flow Doppler. No evidence of pulmonic stenosis.  Aorta: The aortic root is normal in size and structure. The aorta is normal unless otherwise noted.  Pulmonary Artery: Pulmonary hypertension is indeterminant, inadequate TR jet.  Venous: The inferior vena cava was not well  visualized.    +--------------+--------++  LEFT VENTRICLE  +----------------+---------++  +--------------+--------++ Diastology    PLAX 2D   +----------------+---------++  +--------------+--------++ LV e' lateral: 8.38 cm/s  LVIDd: 5.58 cm  +----------------+---------++  +--------------+--------++ LV E/e' lateral:15.5   LVIDs: 3.92 cm  +----------------+---------++  +--------------+--------++ LV e' medial: 7.07 cm/s  LV PW: 1.16 cm  +----------------+---------++  +--------------+--------++ LV E/e' medial: 18.4   LV IVS: 1.18 cm  +----------------+---------++  +--------------+--------++  LVOT diam: 2.30 cm   +--------------+--------++  LV SV: 86 ml   +--------------+--------++  LV SV Index: 28.33   +--------------+--------++  LVOT Area: 4.15 cm  +--------------+--------++      +--------------+--------++  +---------------+----------++  RIGHT VENTRICLE   +---------------+----------++  RV S prime: 11.70 cm/s  +---------------+----------++  TAPSE (M-mode):2.2 cm   +---------------+----------++  +---------------+-------++-----------++  LEFT ATRIUM  Index   +---------------+-------++-----------++  LA diam: 4.40 cm1.59 cm/m   +---------------+-------++-----------++  LA Vol (A2C): 72.2 ml26.13 ml/m  +---------------+-------++-----------++  LA Vol (A4C): 51.5 ml18.64 ml/m  +---------------+-------++-----------++  LA Biplane Vol:62.2 ml22.51 ml/m  +---------------+-------++-----------++  +------------+---------++-----------++  RIGHT ATRIUM Index   +------------+---------++-----------++  RA Area: 15.60 cm   +------------+---------++-----------++  RA Volume: 39.00 ml 14.11 ml/m  +------------+---------++-----------++  +------------------+------------++  AORTIC VALVE    +------------------+------------++  AV Area (Vmax): 0.92 cm    +------------------+------------++  AV Area (Vmean): 0.80 cm   +------------------+------------++  AV Area (VTI): 1.01 cm   +------------------+------------++  AV Vmax: 451.50 cm/s   +------------------+------------++  AV Vmean: 323.500 cm/s  +------------------+------------++  AV VTI: 1.017 m   +------------------+------------++  AV Peak Grad: 81.5 mmHg   +------------------+------------++  AV Mean Grad: 49.5 mmHg   +------------------+------------++  LVOT Vmax: 100.00 cm/s   +------------------+------------++  LVOT Vmean: 62.500 cm/s   +------------------+------------++  LVOT VTI: 0.248 m   +------------------+------------++  LVOT/AV VTI ratio:0.24   +------------------+------------++  +-------------+-------++  AORTA    +-------------+-------++  Ao Root diam:3.20 cm  +-------------+-------++  +--------------+----------++  MITRAL VALVE   +--------------+-------+  +--------------+----------++ SHUNTS    MV Area (PHT):4.80 cm  +--------------+-------+  +--------------+----------++ Systemic VTI: 0.25 m   MV PHT: 45.82 msec +--------------+-------+  +--------------+----------++ Systemic Diam:2.30 cm  MV Decel Time:158 msec  +--------------+-------+  +--------------+----------++  +--------------+-----------++  MV E velocity:130.00 cm/s  +--------------+-----------++  MV A velocity:93.10 cm/s   +--------------+-----------++  MV E/A ratio: 1.40   +--------------+-----------++  Carlyle Dolly MD  Electronically signed by Carlyle Dolly MD  Signature Date/Time: 06/12/2019/4:46:33 PM  RIGHT HEART CATH AND CORONARY ANGIOGRAPHY  Conclusion  1. Calcified coronary arteries with moderate mid LAD stenosis, otherwise mild nonobstructive disease  2. Known severe aortic stenosis by echo assessment with heavy calcification and restriction of the aortic valve leaflets as assessed by plain  fluoroscopy  3. Severe pulmonary hypertension with mean PA pressure 51 mmHg, at least in part to left heart disease with elevated wedge pressure of 31 mmHg  Recommendations: Ongoing multidisciplinary heart team evaluation for treatment of severe symptomatic aortic stenosis. Medical therapy for nonobstructive CAD.  Indications  Severe aortic stenosis [I35.0 (ICD-10-CM)]  Procedural Details  Technical Details INDICATION: Severe aortic stenosis, diastolic heart failure, preoperative study.  PROCEDURAL DETAILS: There was an indwelling IV in a right antecubital vein. Using normal sterile technique, the IV was changed out for a 5 Fr brachial sheath over a 0.018 inch wire. The right wrist was then prepped, draped, and anesthetized with 1% lidocaine. Using the modified Seldinger technique a 5/6 French Slender sheath was placed in the right radial artery. Intra-arterial verapamil was  administered through the radial artery sheath. IV heparin was administered after a JR4 catheter was advanced into the central aorta. A Swan-Ganz catheter was used for the right heart catheterization. Standard protocol was followed for recording of right heart pressures and sampling of oxygen saturations. Fick cardiac output was calculated. Standard Judkins catheters were used for selective coronary angiography. There is known severe aortic stenosis. No attempt is made to cross the aortic valve. There were no immediate procedural complications. The patient was transferred to the post catheterization recovery area for further monitoring.    Estimated blood loss <50 mL.   During this procedure medications were administered to achieve and maintain moderate conscious sedation while the patient's heart rate, blood pressure, and oxygen saturation were continuously monitored and I was present face-to-face 100% of this time.  Medications  (Filter: Administrations occurring from 09/24/19 0717 to 09/24/19 0819)  midazolam (VERSED)  injection (mg)  Total dose: 2 mg  Date/Time  Rate/Dose/Volume Action  09/24/19 0736  2 mg Given  fentaNYL (SUBLIMAZE) injection (mcg)  Total dose: 25 mcg  Date/Time  Rate/Dose/Volume Action  09/24/19 0736  25 mcg Given  lidocaine (PF) (XYLOCAINE) 1 % injection (mL)  Total volume: 6 mL  Date/Time  Rate/Dose/Volume Action  09/24/19 0744  3 mL Given  0754  3 mL Given  Heparin (Porcine) in NaCl 1000-0.9 UT/500ML-% SOLN (mL)  Total volume: 500 mL  Date/Time  Rate/Dose/Volume Action  09/24/19 0745  500 mL Given  Radial Cocktail/Verapamil only (mL)  Total volume: 10 mL  Date/Time  Rate/Dose/Volume Action  09/24/19 0754  10 mL Given  heparin injection (Units)  Total dose: 7,000 Units  Date/Time  Rate/Dose/Volume Action  09/24/19 0757  7,000 Units Given  iohexol (OMNIPAQUE) 350 MG/ML injection (mL)  Total volume: 70 mL  Date/Time  Rate/Dose/Volume Action  09/24/19 0815  70 mL Given  Sedation Time  Sedation Time Physician-1: 33 minutes 6 seconds  Contrast  Medication Name Total Dose  iohexol (OMNIPAQUE) 350 MG/ML injection 70 mL  Radiation/Fluoro  Fluoro time: 6.4 (min)  DAP: 39364 (mGycm2)  Cumulative Air Kerma: 623 (mGy)  Coronary Findings  Diagnostic  Dominance: Right  Left Main  The vessel is mildly calcified. Patent without stenosis. Divides into the LAD and left circumflex.  Left Anterior Descending  There is mild diffuse disease throughout the vessel. The vessel is severely calcified. The LAD has diffuse calcification present. There is no high-grade stenosis. The proximal LAD is heavily calcified but widely patent. The mid LAD after the first diagonal has 40 to 50% stenosis. The distal LAD has diffuse nonobstructive disease with moderate calcification.  Prox LAD to Mid LAD lesion 50% stenosed  Prox LAD to Mid LAD lesion is 50% stenosed. The lesion is moderately calcified. Mild to moderate mid LAD stenosis after the first diagonal branch, estimated at 40 to 50% with moderate  calcification.  Left Circumflex  Vessel is small. The vessel exhibits minimal luminal irregularities. The circumflex distribution is relatively small. The first OM branch is small in caliber. There is no significant stenosis throughout the circumflex distribution.  Right Coronary Artery  Vessel is large. There is mild diffuse disease throughout the vessel. The vessel is moderately calcified. The RCA is a large, dominant vessel. The vessel is diffusely calcified. Distally, it divides into the PDA and posterior AV segment which supplies a single PLA branch. There are no significant stenoses throughout the RCA or its branch vessels.  Intervention  No interventions have been documented.  Right  Heart  Right Heart Pressures Hemodynamic findings consistent with severe pulmonary hypertension. Elevated LV EDP consistent with volume overload.  Coronary Diagrams  Diagnostic  Dominance: Right   Intervention  Implants     No implant documentation for this case.  Syngo Images  Link to Procedure Log   Show images for CARDIAC CATHETERIZATION Procedure Log  Images on Long Term Storage    Show images for Worthy Flankllis, Glade R "Bernette RedbirdKenny"   Hemo Data   Most Recent Value  Fick Cardiac Output 5.98 L/min  Fick Cardiac Output Index 2.18 (L/min)/BSA  RA A Wave 15 mmHg  RA V Wave 13 mmHg  RA Mean 10 mmHg  RV Systolic Pressure 67 mmHg  RV Diastolic Pressure 8 mmHg  RV EDP 13 mmHg  PA Systolic Pressure 71 mmHg  PA Diastolic Pressure 33 mmHg  PA Mean 51 mmHg  PW A Wave 36 mmHg  PW V Wave 33 mmHg  PW Mean 31 mmHg  AO Systolic Pressure 123 mmHg  AO Diastolic Pressure 85 mmHg  AO Mean 100 mmHg  QP/QS 1.28  TPVR Index 23.36 HRUI  TSVR Index 45.81 HRUI  PVR SVR Ratio 0.22  TPVR/TSVR Ratio 0.51  Cardiac TAVR CT  TECHNIQUE:  The patient was scanned on a [employer] scanner. A 120 kV  retrospective scan was triggered in the descending thoracic aorta at  111 HU's. Gantry rotation speed was 250 msecs and  collimation was .6  mm. No beta blockade or nitro were given. The 3D data set was  reconstructed in 5% intervals of the R-R cycle. Systolic and  diastolic phases were analyzed on a dedicated work station using  MPR, MIP and VRT modes. The patient received 80 cc of contrast.  FINDINGS:  Image quality: Average.  Noise artifact is: Moderate signal-to-noise due to obesity (BMI 55).  Valve Morphology: Bicuspid, Sievers type 0 (non-raphe). The leaflets  are severely calcified with restricted movement.  Aortic valve calcium score: Calcium score protocol not performed.  Aortic annular dimension: Difficult annular assessment due to signal  to noise artifact. Large annular size noted.  Phase assessed: 35%  Annular area: 6.51 cm2  Annular perimeter: 92.2 mm  Max diameter: 31.8 mm  Min diameter: 27.1 mm  Annular and su-bannular calcification: No significant annular  calcification. Mild calcification of the sub-annular area in the  aorta-mitral valve continuity.  Optimal coplanar projection: LAO 6 CRA 11  Coronary Artery Height above Annulus:  Left Main: 13.5 mm  Right Coronary: 18.6 mm  Sinus of Valsalva: Cusp to Cusp (bicuspid), 40 mm  Sinotubular Junction: 29.4 mm  STJ Height: 20.2 mm  Ascending Thoracic Aorta: 44 mm  Coronary arteries: Normal origins. Right dominance. Severe 3-vessel  calcifications noted. Study performed without NTG and insufficient  for evaluation.  Cardiac Morphology:  Right Atrium: Right atrial size is within normal limits.  Right Ventricle: The right ventricular cavity is within normal  limits.  Left Atrium: Left atrial size is normal in size with no left atrial  appendage filling defect.  Left Ventricle: The ventricular cavity size is within normal limits.  There are no stigmata of prior infarction. There is no abnormal  filling defect. Normal left ventricular function, EF 64%. No wall  motion abnormalities.  Pulmonary arteries: Normal in size without proximal  filling defect.  Pulmonary veins: Normal pulmonary venous drainage. There were 4  noted pulmonary veins, 2 on the right and 2 on the left.  Pericardium: Normal thickness with no significant effusion or  calcium present.  Mitral Valve: The mitral valve is normal structure with mild mitral  annular calcification.  Extra-cardiac findings: See attached radiology report for  non-cardiac structures.  IMPRESSION:  1. Average quality study due to moderate signal- to-noise artifact  due to severe obesity (BMI 55).  2. Bicuspid valve, Sievers type 0 (non-raphe). Severely calcified  with restricted leaflet motion.  3. Annular area 6.51 cm2. Appropriate for 29 mm Edwards Sapien 3  TAVR.  4. Sufficient coronary to annulus distance.  5. Optimum Fluoroscopic Angle for Delivery: LAO 6 CRA 11  6. No thrombus in the left atrial appendage.  7. 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  8. Severe 3-vessel calcifications noted.  Lennie Odor, MD  Electronically Signed  By: Lennie Odor  On: 10/05/2019 18:13  CT ANGIOGRAPHY CHEST, ABDOMEN AND PELVIS  TECHNIQUE:  Multidetector CT imaging through the chest, abdomen and pelvis was  performed using the standard protocol during bolus administration of  intravenous contrast. Multiplanar reconstructed images and MIPs were  obtained and reviewed to evaluate the vascular anatomy.  CONTRAST: OMNIPAQUE IOHEXOL 350 MG/ML SOLN  COMPARISON: CT the abdomen and pelvis 01/20/2007.  FINDINGS:  CTA CHEST FINDINGS  Cardiovascular: Heart size is normal. There is no significant  pericardial fluid, thickening or pericardial calcification. There is  aortic atherosclerosis, as well as atherosclerosis of the great  vessels of the mediastinum and the coronary arteries, including  calcified atherosclerotic plaque in the left main, left anterior  descending, left circumflex and right coronary arteries. Severe  thickening calcification of the aortic valve. Ectasia of  ascending  thoracic aorta (4.3 cm in diameter).  Mediastinum/Lymph Nodes: No pathologically enlarged mediastinal or  hilar lymph nodes. Esophagus is unremarkable in appearance. No  axillary lymphadenopathy.  Lungs/Pleura: No suspicious appearing pulmonary nodules or masses  are noted. No acute consolidative airspace disease. No pleural  effusions.  Musculoskeletal/Soft Tissues: There are no aggressive appearing  lytic or blastic lesions noted in the visualized portions of the  skeleton.  CTA ABDOMEN AND PELVIS FINDINGS  Hepatobiliary: Diffuse low attenuation throughout the hepatic  parenchyma, indicative of hepatic steatosis. No suspicious cystic or  solid hepatic lesions. No intra or extrahepatic biliary ductal  dilatation. Small calcified granuloma in the left lobe of the liver  incidentally noted. Partially calcified gallstones are noted within  the gallbladder. No surrounding inflammatory changes to suggest an  acute cholecystitis at this time.  Pancreas: No pancreatic mass. No pancreatic ductal dilatation. No  pancreatic or peripancreatic fluid collections or inflammatory  changes.  Spleen: Unremarkable.  Adrenals/Urinary Tract: Bilateral kidneys and adrenal glands are  normal in appearance. No hydroureteronephrosis. Urinary bladder is  normal in appearance.  Stomach/Bowel: Normal appearance of the stomach. No pathologic  dilatation of small bowel or colon. The appendix is not confidently  identified and may be surgically absent. Regardless, there are no  inflammatory changes noted adjacent to the cecum to suggest the  presence of an acute appendicitis at this time.  Vascular/Lymphatic: Aortic atherosclerosis, without evidence of  aneurysm or dissection in the abdominal or pelvic vasculature.  Vascular findings and measurements pertinent to potential TAVR  procedure, as detailed below. No lymphadenopathy noted in the  abdomen or pelvis.  Reproductive: Prostate gland and  seminal vesicles are unremarkable  in appearance.  Other: No significant volume of ascites. No pneumoperitoneum.  Musculoskeletal: There are no aggressive appearing lytic or blastic  lesions noted in the visualized portions of the skeleton.  VASCULAR MEASUREMENTS PERTINENT TO  TAVR:  AORTA:  Minimal Aortic Diameter-20 x 18 mm  Severity of Aortic Calcification-mild  RIGHT PELVIS:  Right Common Iliac Artery -  Minimal Diameter-12.8 x 9.7 mm  Tortuosity - mild  Calcification - mild  Right External Iliac Artery -  Minimal Diameter-10.1 x 10.0 mm  Tortuosity - mild  Calcification-none  Right Common Femoral Artery -  Minimal Diameter-9.4 x 9.2 mm  Tortuosity - mild  Calcification-none  LEFT PELVIS:  Left Common Iliac Artery -  Minimal Diameter-13.0 x 11.9 mm  Tortuosity - mild  Calcification - mild  Left External Iliac Artery -  Minimal Diameter-10.1 x 9.8 mm  Tortuosity - mild  Calcification-none  Left Common Femoral Artery -  Minimal Diameter-9.7 x 10.0 mm  Tortuosity-mild  Calcification-none  Review of the MIP images confirms the above findings.  IMPRESSION:  1. Vascular findings and measurements pertinent to potential TAVR  procedure, as detailed above.  2. Severe thickening and calcification of the aortic valve,  compatible with reported clinical history of severe aortic stenosis.  3. Aortic atherosclerosis, in addition to left main and 3 vessel  coronary artery disease. Please note that although the presence of  coronary artery calcium documents the presence of coronary artery  disease, the severity of this disease and any potential stenosis  cannot be assessed on this non-gated CT examination. Assessment for  potential risk factor modification, dietary therapy or pharmacologic  therapy may be warranted, if clinically indicated.  4. There is also ectasia of the ascending thoracic aorta (4.3 cm in  diameter).  5. Hepatic steatosis.  6. Cholelithiasis without evidence  of acute cholecystitis at this  time.  Electronically Signed  By: Trudie Reed M.D.  On: 10/05/2019 11:48  EKG: NSR w/out significant AV conduction delay      Impression:  Patient has bicuspid aortic valve with stage D severe symptomatic aortic stenosis.  He describes stable symptoms of exertional shortness of breath consistent with chronic diastolic congestive heart failure, New [place] Heart Association functional class IIb.   In addition, he recently suffered a syncopal episode that was preceded by more strenuous physical exertion.  I have personally reviewed the patient's most recent transthoracic echocardiogram, diagnostic cardiac catheterization, twelve-lead EKG, and CT angiograms. Transthoracic echocardiogram demonstrates severe aortic stenosis with preserved left ventricular systolic function. Acoustic windows were relatively poor quality but the entire aortic valve is notable for heavily calcified and thickened leaflets with severely restricted leaflet mobility. Peak velocity across aortic valve measured 4.5 m/s corresponding to mean transvalvular gradient estimated close to 50 mmHg. Left ventricular systolic function remains normal. Diagnostic cardiac catheterization reveals nonobstructive coronary artery disease with moderate stenosis in the mid left anterior descending coronary artery that does not appear to be flow-limiting. There are no other areas of significant plaque identified. Transvalvular gradient was not assessed but the patient was noted to have severe pulmonary hypertension. Cardiac gated CT angiogram of the heart was relatively poor quality and limited because of patient's body habitus. Precise quantification of the size of the aortic annulus issubsequently challenging, but overall the annulus size is relatively large. The patient appears to have a Sievers type 0 bicuspid aortic valve and there is heavy calcification involving the leaflets that is somewhat  asymmetrical.There is mild ectasia of the ascending aorta measuring 4.3 cm in its greatest transverse diameter. EKG reveals sinus rhythm with no significant AV conduction delay.   Plan:  The patient was again counseled at length regarding treatment alternatives for the management of severe symptomatic  aortic stenosis. Alternative approaches such as conventional aortic valve replacement, transcatheter aortic valve replacement, and continued medical therapy without intervention were compared and contrasted at length.  Surgical options were discussed at length including conventional surgical aortic valve replacement through either a full median sternotomy or using minimally invasive techniques.  Other alternatives including rapid-deployment bioprosthetic tissue valve replacement, transcatheter aortic valve replacement, patch enlargement of the aortic root, stentless porcine aortic root replacement, valve repair, the Ross autograft procedure, and homograft aortic root replacement were discussed.  Discussion was held comparing the relative risks of mechanical valve replacement with need for lifelong anticoagulation versus use of a bioprosthetic tissue valve and the associated potential for late structural valve deterioration and failure.  This discussion was placed in the context of the patient's particular circumstances, and as a result the patient specifically requests that their valve be replaced using a mechanical prosthetic valve.  The patient understands and accepts all potential associated risks of surgery including but not limited to risk of death, stroke, myocardial infarction, congestive heart failure, respiratory failure, renal failure, pneumonia, bleeding requiring blood transfusion and or reexploration, arrhythmia, heart block or bradycardia requiring permanent pacemaker, aortic dissection or other major vascular complication, pleural effusions or other delayed complications related to continued  congestive heart failure, and other late complications related to valve replacement including structural valve deterioration and failure, thrombosis, endocarditis, or paravalvular leak.  We plan to proceed with surgery on Wednesday, December 05, 2019.       Salvatore Decent. Cornelius Moras, MD 12/03/2019 1:42 PM

## 2019-12-05 ENCOUNTER — Inpatient Hospital Stay (HOSPITAL_COMMUNITY): Payer: Self-pay | Admitting: Vascular Surgery

## 2019-12-05 ENCOUNTER — Inpatient Hospital Stay (HOSPITAL_COMMUNITY): Payer: Self-pay

## 2019-12-05 ENCOUNTER — Encounter (HOSPITAL_COMMUNITY): Payer: Self-pay | Admitting: Thoracic Surgery (Cardiothoracic Vascular Surgery)

## 2019-12-05 ENCOUNTER — Other Ambulatory Visit: Payer: Self-pay

## 2019-12-05 ENCOUNTER — Encounter (HOSPITAL_COMMUNITY)
Admission: RE | Disposition: A | Discharge status: Home / Self Care | Payer: Self-pay | Source: Home / Self Care | Attending: Thoracic Surgery (Cardiothoracic Vascular Surgery)

## 2019-12-05 ENCOUNTER — Inpatient Hospital Stay (HOSPITAL_COMMUNITY)
Admission: RE | Admit: 2019-12-05 | Discharge: 2019-12-12 | DRG: 220 | Disposition: A | Discharge status: Home / Self Care | Payer: Self-pay | Attending: Thoracic Surgery (Cardiothoracic Vascular Surgery) | Admitting: Thoracic Surgery (Cardiothoracic Vascular Surgery)

## 2019-12-05 DIAGNOSIS — I272 Pulmonary hypertension, unspecified: Secondary | ICD-10-CM | POA: Diagnosis present

## 2019-12-05 DIAGNOSIS — I350 Nonrheumatic aortic (valve) stenosis: Secondary | ICD-10-CM

## 2019-12-05 DIAGNOSIS — Z9689 Presence of other specified functional implants: Secondary | ICD-10-CM

## 2019-12-05 DIAGNOSIS — Z791 Long term (current) use of non-steroidal anti-inflammatories (NSAID): Secondary | ICD-10-CM

## 2019-12-05 DIAGNOSIS — Z87891 Personal history of nicotine dependence: Secondary | ICD-10-CM

## 2019-12-05 DIAGNOSIS — Z8349 Family history of other endocrine, nutritional and metabolic diseases: Secondary | ICD-10-CM

## 2019-12-05 DIAGNOSIS — Z8249 Family history of ischemic heart disease and other diseases of the circulatory system: Secondary | ICD-10-CM

## 2019-12-05 DIAGNOSIS — E1165 Type 2 diabetes mellitus with hyperglycemia: Secondary | ICD-10-CM | POA: Diagnosis present

## 2019-12-05 DIAGNOSIS — Z6841 Body Mass Index (BMI) 40.0 and over, adult: Secondary | ICD-10-CM

## 2019-12-05 DIAGNOSIS — E7800 Pure hypercholesterolemia, unspecified: Secondary | ICD-10-CM | POA: Diagnosis present

## 2019-12-05 DIAGNOSIS — Z7982 Long term (current) use of aspirin: Secondary | ICD-10-CM

## 2019-12-05 DIAGNOSIS — E66813 Obesity, class 3: Secondary | ICD-10-CM | POA: Diagnosis present

## 2019-12-05 DIAGNOSIS — Z952 Presence of prosthetic heart valve: Secondary | ICD-10-CM

## 2019-12-05 DIAGNOSIS — Z20822 Contact with and (suspected) exposure to covid-19: Secondary | ICD-10-CM | POA: Diagnosis present

## 2019-12-05 DIAGNOSIS — I2510 Atherosclerotic heart disease of native coronary artery without angina pectoris: Secondary | ICD-10-CM | POA: Diagnosis present

## 2019-12-05 DIAGNOSIS — Z79899 Other long term (current) drug therapy: Secondary | ICD-10-CM

## 2019-12-05 DIAGNOSIS — I10 Essential (primary) hypertension: Secondary | ICD-10-CM | POA: Diagnosis present

## 2019-12-05 DIAGNOSIS — E119 Type 2 diabetes mellitus without complications: Secondary | ICD-10-CM

## 2019-12-05 DIAGNOSIS — Z823 Family history of stroke: Secondary | ICD-10-CM

## 2019-12-05 DIAGNOSIS — J9811 Atelectasis: Secondary | ICD-10-CM

## 2019-12-05 DIAGNOSIS — E118 Type 2 diabetes mellitus with unspecified complications: Secondary | ICD-10-CM | POA: Diagnosis present

## 2019-12-05 DIAGNOSIS — Z954 Presence of other heart-valve replacement: Secondary | ICD-10-CM

## 2019-12-05 HISTORY — PX: AORTIC VALVE REPLACEMENT: SHX41

## 2019-12-05 HISTORY — DX: Presence of other heart-valve replacement: Z95.4

## 2019-12-05 HISTORY — PX: TEE WITHOUT CARDIOVERSION: SHX5443

## 2019-12-05 LAB — POCT I-STAT, CHEM 8
BUN: 13 mg/dL (ref 6–20)
BUN: 11 mg/dL (ref 6–20)
BUN: 11 mg/dL (ref 6–20)
BUN: 12 mg/dL (ref 6–20)
Calcium, Ion: 1.26 mmol/L (ref 1.15–1.40)
Calcium, Ion: 1.11 mmol/L — ABNORMAL LOW (ref 1.15–1.40)
Calcium, Ion: 1.13 mmol/L — ABNORMAL LOW (ref 1.15–1.40)
Calcium, Ion: 1.16 mmol/L (ref 1.15–1.40)
Chloride: 101 mmol/L (ref 98–111)
Chloride: 103 mmol/L (ref 98–111)
Chloride: 100 mmol/L (ref 98–111)
Chloride: 102 mmol/L (ref 98–111)
Creatinine, Ser: 0.6 mg/dL — ABNORMAL LOW (ref 0.61–1.24)
Creatinine, Ser: 0.5 mg/dL — ABNORMAL LOW (ref 0.61–1.24)
Creatinine, Ser: 0.5 mg/dL — ABNORMAL LOW (ref 0.61–1.24)
Creatinine, Ser: 0.6 mg/dL — ABNORMAL LOW (ref 0.61–1.24)
Glucose, Bld: 163 mg/dL — ABNORMAL HIGH (ref 70–99)
Glucose, Bld: 155 mg/dL — ABNORMAL HIGH (ref 70–99)
Glucose, Bld: 176 mg/dL — ABNORMAL HIGH (ref 70–99)
Glucose, Bld: 166 mg/dL — ABNORMAL HIGH (ref 70–99)
HCT: 35 % — ABNORMAL LOW (ref 39.0–52.0)
HCT: 33 % — ABNORMAL LOW (ref 39.0–52.0)
HCT: 31 % — ABNORMAL LOW (ref 39.0–52.0)
HCT: 34 % — ABNORMAL LOW (ref 39.0–52.0)
Hemoglobin: 11.9 g/dL — ABNORMAL LOW (ref 13.0–17.0)
Hemoglobin: 11.2 g/dL — ABNORMAL LOW (ref 13.0–17.0)
Hemoglobin: 10.5 g/dL — ABNORMAL LOW (ref 13.0–17.0)
Hemoglobin: 11.6 g/dL — ABNORMAL LOW (ref 13.0–17.0)
Potassium: 4.7 mmol/L (ref 3.5–5.1)
Potassium: 5.6 mmol/L — ABNORMAL HIGH (ref 3.5–5.1)
Potassium: 4.5 mmol/L (ref 3.5–5.1)
Potassium: 4.1 mmol/L (ref 3.5–5.1)
Sodium: 137 mmol/L (ref 135–145)
Sodium: 135 mmol/L (ref 135–145)
Sodium: 134 mmol/L — ABNORMAL LOW (ref 135–145)
Sodium: 137 mmol/L (ref 135–145)
TCO2: 27 mmol/L (ref 22–32)
TCO2: 28 mmol/L (ref 22–32)
TCO2: 27 mmol/L (ref 22–32)
TCO2: 25 mmol/L (ref 22–32)

## 2019-12-05 LAB — POCT I-STAT 7, (LYTES, BLD GAS, ICA,H+H)
Acid-Base Excess: 4 mmol/L — ABNORMAL HIGH (ref 0.0–2.0)
Acid-Base Excess: 3 mmol/L — ABNORMAL HIGH (ref 0.0–2.0)
Acid-base deficit: 1 mmol/L (ref 0.0–2.0)
Acid-base deficit: 3 mmol/L — ABNORMAL HIGH (ref 0.0–2.0)
Acid-base deficit: 1 mmol/L (ref 0.0–2.0)
Acid-base deficit: 1 mmol/L (ref 0.0–2.0)
Acid-base deficit: 1 mmol/L (ref 0.0–2.0)
Bicarbonate: 26.3 mmol/L (ref 20.0–28.0)
Bicarbonate: 29.6 mmol/L — ABNORMAL HIGH (ref 20.0–28.0)
Bicarbonate: 24.1 mmol/L (ref 20.0–28.0)
Bicarbonate: 26.9 mmol/L (ref 20.0–28.0)
Bicarbonate: 26.7 mmol/L (ref 20.0–28.0)
Bicarbonate: 25.2 mmol/L (ref 20.0–28.0)
Bicarbonate: 24.2 mmol/L (ref 20.0–28.0)
Bicarbonate: 25.4 mmol/L (ref 20.0–28.0)
Calcium, Ion: 1.2 mmol/L (ref 1.15–1.40)
Calcium, Ion: 1.09 mmol/L — ABNORMAL LOW (ref 1.15–1.40)
Calcium, Ion: 1.11 mmol/L — ABNORMAL LOW (ref 1.15–1.40)
Calcium, Ion: 1.18 mmol/L (ref 1.15–1.40)
Calcium, Ion: 1.18 mmol/L (ref 1.15–1.40)
Calcium, Ion: 1.16 mmol/L (ref 1.15–1.40)
Calcium, Ion: 1.16 mmol/L (ref 1.15–1.40)
Calcium, Ion: 1.18 mmol/L (ref 1.15–1.40)
HCT: 37 % — ABNORMAL LOW (ref 39.0–52.0)
HCT: 34 % — ABNORMAL LOW (ref 39.0–52.0)
HCT: 31 % — ABNORMAL LOW (ref 39.0–52.0)
HCT: 34 % — ABNORMAL LOW (ref 39.0–52.0)
HCT: 39 % (ref 39.0–52.0)
HCT: 37 % — ABNORMAL LOW (ref 39.0–52.0)
HCT: 37 % — ABNORMAL LOW (ref 39.0–52.0)
HCT: 37 % — ABNORMAL LOW (ref 39.0–52.0)
Hemoglobin: 12.6 g/dL — ABNORMAL LOW (ref 13.0–17.0)
Hemoglobin: 11.6 g/dL — ABNORMAL LOW (ref 13.0–17.0)
Hemoglobin: 10.5 g/dL — ABNORMAL LOW (ref 13.0–17.0)
Hemoglobin: 11.6 g/dL — ABNORMAL LOW (ref 13.0–17.0)
Hemoglobin: 13.3 g/dL (ref 13.0–17.0)
Hemoglobin: 12.6 g/dL — ABNORMAL LOW (ref 13.0–17.0)
Hemoglobin: 12.6 g/dL — ABNORMAL LOW (ref 13.0–17.0)
Hemoglobin: 12.6 g/dL — ABNORMAL LOW (ref 13.0–17.0)
O2 Saturation: 98 %
O2 Saturation: 100 %
O2 Saturation: 100 %
O2 Saturation: 92 %
O2 Saturation: 94 %
O2 Saturation: 98 %
O2 Saturation: 97 %
O2 Saturation: 94 %
Patient temperature: 36.6
Patient temperature: 36.3
Patient temperature: 36
Patient temperature: 36
Potassium: 4.5 mmol/L (ref 3.5–5.1)
Potassium: 4.4 mmol/L (ref 3.5–5.1)
Potassium: 4.1 mmol/L (ref 3.5–5.1)
Potassium: 4.6 mmol/L (ref 3.5–5.1)
Potassium: 4.4 mmol/L (ref 3.5–5.1)
Potassium: 4.4 mmol/L (ref 3.5–5.1)
Potassium: 4.5 mmol/L (ref 3.5–5.1)
Potassium: 4.3 mmol/L (ref 3.5–5.1)
Sodium: 137 mmol/L (ref 135–145)
Sodium: 138 mmol/L (ref 135–145)
Sodium: 135 mmol/L (ref 135–145)
Sodium: 138 mmol/L (ref 135–145)
Sodium: 138 mmol/L (ref 135–145)
Sodium: 137 mmol/L (ref 135–145)
Sodium: 138 mmol/L (ref 135–145)
Sodium: 138 mmol/L (ref 135–145)
TCO2: 28 mmol/L (ref 22–32)
TCO2: 31 mmol/L (ref 22–32)
TCO2: 26 mmol/L (ref 22–32)
TCO2: 28 mmol/L (ref 22–32)
TCO2: 28 mmol/L (ref 22–32)
TCO2: 27 mmol/L (ref 22–32)
TCO2: 25 mmol/L (ref 22–32)
TCO2: 27 mmol/L (ref 22–32)
pCO2 arterial: 52.3 mmHg — ABNORMAL HIGH (ref 32.0–48.0)
pCO2 arterial: 50.5 mmHg — ABNORMAL HIGH (ref 32.0–48.0)
pCO2 arterial: 36.4 mmHg (ref 32.0–48.0)
pCO2 arterial: 50.8 mmHg — ABNORMAL HIGH (ref 32.0–48.0)
pCO2 arterial: 57 mmHg — ABNORMAL HIGH (ref 32.0–48.0)
pCO2 arterial: 41.9 mmHg (ref 32.0–48.0)
pCO2 arterial: 40.2 mmHg (ref 32.0–48.0)
pCO2 arterial: 45.2 mmHg (ref 32.0–48.0)
pH, Arterial: 7.309 — ABNORMAL LOW (ref 7.350–7.450)
pH, Arterial: 7.376 (ref 7.350–7.450)
pH, Arterial: 7.283 — ABNORMAL LOW (ref 7.350–7.450)
pH, Arterial: 7.476 — ABNORMAL HIGH (ref 7.350–7.450)
pH, Arterial: 7.278 — ABNORMAL LOW (ref 7.350–7.450)
pH, Arterial: 7.385 (ref 7.350–7.450)
pH, Arterial: 7.383 (ref 7.350–7.450)
pH, Arterial: 7.354 (ref 7.350–7.450)
pO2, Arterial: 108 mmHg (ref 83.0–108.0)
pO2, Arterial: 549 mmHg — ABNORMAL HIGH (ref 83.0–108.0)
pO2, Arterial: 487 mmHg — ABNORMAL HIGH (ref 83.0–108.0)
pO2, Arterial: 71 mmHg — ABNORMAL LOW (ref 83.0–108.0)
pO2, Arterial: 82 mmHg — ABNORMAL LOW (ref 83.0–108.0)
pO2, Arterial: 110 mmHg — ABNORMAL HIGH (ref 83.0–108.0)
pO2, Arterial: 85 mmHg (ref 83.0–108.0)
pO2, Arterial: 73 mmHg — ABNORMAL LOW (ref 83.0–108.0)

## 2019-12-05 LAB — BLOOD GAS, ARTERIAL
Acid-Base Excess: 1.2 mmol/L (ref 0.0–2.0)
Bicarbonate: 26 mmol/L (ref 20.0–28.0)
FIO2: 28
O2 Saturation: 98.2 %
Patient temperature: 37
pCO2 arterial: 47.2 mmHg (ref 32.0–48.0)
pH, Arterial: 7.361 (ref 7.350–7.450)
pO2, Arterial: 122 mmHg — ABNORMAL HIGH (ref 83.0–108.0)

## 2019-12-05 LAB — ECHO INTRAOPERATIVE TEE
Height: 70 in
Weight: 5920 oz

## 2019-12-05 LAB — CBC
HCT: 40.6 % (ref 39.0–52.0)
HCT: 37.2 % — ABNORMAL LOW (ref 39.0–52.0)
Hemoglobin: 13.4 g/dL (ref 13.0–17.0)
Hemoglobin: 12.4 g/dL — ABNORMAL LOW (ref 13.0–17.0)
MCH: 29.7 pg (ref 26.0–34.0)
MCH: 29.7 pg (ref 26.0–34.0)
MCHC: 33 g/dL (ref 30.0–36.0)
MCHC: 33.3 g/dL (ref 30.0–36.0)
MCV: 90 fL (ref 80.0–100.0)
MCV: 89 fL (ref 80.0–100.0)
Platelets: 192 10*3/uL (ref 150–400)
Platelets: 159 10*3/uL (ref 150–400)
RBC: 4.51 MIL/uL (ref 4.22–5.81)
RBC: 4.18 MIL/uL — ABNORMAL LOW (ref 4.22–5.81)
RDW: 13.1 % (ref 11.5–15.5)
RDW: 13.1 % (ref 11.5–15.5)
WBC: 32.3 10*3/uL — ABNORMAL HIGH (ref 4.0–10.5)
WBC: 19.3 10*3/uL — ABNORMAL HIGH (ref 4.0–10.5)
nRBC: 0 % (ref 0.0–0.2)
nRBC: 0 % (ref 0.0–0.2)

## 2019-12-05 LAB — BASIC METABOLIC PANEL
Anion gap: 7 (ref 5–15)
BUN: 11 mg/dL (ref 6–20)
CO2: 23 mmol/L (ref 22–32)
Calcium: 7.9 mg/dL — ABNORMAL LOW (ref 8.9–10.3)
Chloride: 106 mmol/L (ref 98–111)
Creatinine, Ser: 0.73 mg/dL (ref 0.61–1.24)
GFR calc Af Amer: >60 mL/min (ref >60)
GFR calc non Af Amer: >60 mL/min (ref >60)
Glucose, Bld: 179 mg/dL — ABNORMAL HIGH (ref 70–99)
Potassium: 4.5 mmol/L (ref 3.5–5.1)
Sodium: 136 mmol/L (ref 135–145)

## 2019-12-05 LAB — GLUCOSE, CAPILLARY
Glucose-Capillary: 165 mg/dL — ABNORMAL HIGH (ref 70–99)
Glucose-Capillary: 136 mg/dL — ABNORMAL HIGH (ref 70–99)
Glucose-Capillary: 136 mg/dL — ABNORMAL HIGH (ref 70–99)
Glucose-Capillary: 129 mg/dL — ABNORMAL HIGH (ref 70–99)
Glucose-Capillary: 131 mg/dL — ABNORMAL HIGH (ref 70–99)
Glucose-Capillary: 147 mg/dL — ABNORMAL HIGH (ref 70–99)
Glucose-Capillary: 172 mg/dL — ABNORMAL HIGH (ref 70–99)
Glucose-Capillary: 163 mg/dL — ABNORMAL HIGH (ref 70–99)

## 2019-12-05 LAB — PLATELET COUNT: Platelets: 222 10*3/uL (ref 150–400)

## 2019-12-05 LAB — MAGNESIUM: Magnesium: 2.6 mg/dL — ABNORMAL HIGH (ref 1.7–2.4)

## 2019-12-05 LAB — PROTIME-INR
INR: 1.3 — ABNORMAL HIGH (ref 0.8–1.2)
Prothrombin Time: 16.1 seconds — ABNORMAL HIGH (ref 11.4–15.2)

## 2019-12-05 LAB — APTT: aPTT: 31 seconds (ref 24–36)

## 2019-12-05 LAB — HEMOGLOBIN AND HEMATOCRIT, BLOOD
HCT: 32.6 % — ABNORMAL LOW (ref 39.0–52.0)
Hemoglobin: 10.9 g/dL — ABNORMAL LOW (ref 13.0–17.0)

## 2019-12-05 SURGERY — REPLACEMENT, AORTIC VALVE, OPEN
Anesthesia: General | Site: Chest

## 2019-12-05 MED ORDER — ROCURONIUM BROMIDE 10 MG/ML (PF) SYRINGE
PREFILLED_SYRINGE | INTRAVENOUS | Status: AC
  Filled 2019-12-05: qty 10

## 2019-12-05 MED ORDER — FENTANYL CITRATE (PF) 250 MCG/5ML IJ SOLN
INTRAMUSCULAR | Status: DC | PRN
  Administered 2019-12-05 (×2): 100 ug via INTRAVENOUS
  Administered 2019-12-05: 50 ug via INTRAVENOUS
  Administered 2019-12-05: 100 ug via INTRAVENOUS
  Administered 2019-12-05: 200 ug via INTRAVENOUS
  Administered 2019-12-05 (×5): 100 ug via INTRAVENOUS
  Administered 2019-12-05: 50 ug via INTRAVENOUS
  Administered 2019-12-05: 100 ug via INTRAVENOUS
  Administered 2019-12-05: 50 ug via INTRAVENOUS

## 2019-12-05 MED ORDER — SODIUM CHLORIDE 0.9 % IV SOLN: INTRAVENOUS | Status: DC

## 2019-12-05 MED ORDER — ONDANSETRON HCL 4 MG/2ML IJ SOLN
INTRAMUSCULAR | Status: AC
  Filled 2019-12-05: qty 2

## 2019-12-05 MED ORDER — METOPROLOL TARTRATE 12.5 MG HALF TABLET
12.5000 mg | ORAL_TABLET | Freq: Once | ORAL | Status: DC
  Filled 2019-12-05: qty 1

## 2019-12-05 MED ORDER — PHENYLEPHRINE HCL-NACL 10-0.9 MG/250ML-% IV SOLN
INTRAVENOUS | Status: DC | PRN
  Administered 2019-12-05: 30 ug/min via INTRAVENOUS

## 2019-12-05 MED ORDER — HEPARIN SODIUM (PORCINE) 1000 UNIT/ML IJ SOLN
INTRAMUSCULAR | Status: DC | PRN
  Administered 2019-12-05: 50000 [IU] via INTRAVENOUS

## 2019-12-05 MED ORDER — SODIUM CHLORIDE 0.9 % IV SOLN: 250.0000 mL | INTRAVENOUS | Status: DC

## 2019-12-05 MED ORDER — SODIUM CHLORIDE 0.9 % IR SOLN
Status: DC | PRN
  Administered 2019-12-05: 5000 mL

## 2019-12-05 MED ORDER — FENTANYL CITRATE (PF) 250 MCG/5ML IJ SOLN
INTRAMUSCULAR | Status: AC
  Filled 2019-12-05: qty 5

## 2019-12-05 MED ORDER — HEPARIN SODIUM (PORCINE) 1000 UNIT/ML IJ SOLN
INTRAMUSCULAR | Status: AC
  Filled 2019-12-05: qty 1

## 2019-12-05 MED ORDER — CHLORHEXIDINE GLUCONATE CLOTH 2 % EX PADS
6.0000 | MEDICATED_PAD | Freq: Every day | CUTANEOUS | Status: DC
  Administered 2019-12-05 – 2019-12-08 (×4): 6 via TOPICAL

## 2019-12-05 MED ORDER — SODIUM CHLORIDE 0.45 % IV SOLN: INTRAVENOUS | Status: DC | PRN

## 2019-12-05 MED ORDER — ACETAMINOPHEN 500 MG PO TABS
1000.0000 mg | ORAL_TABLET | Freq: Four times a day (QID) | ORAL | Status: AC
  Administered 2019-12-05 – 2019-12-10 (×20): 1000 mg via ORAL
  Filled 2019-12-05 (×20): qty 2

## 2019-12-05 MED ORDER — LACTATED RINGERS IV SOLN: INTRAVENOUS | Status: DC | PRN

## 2019-12-05 MED ORDER — ASPIRIN 81 MG PO CHEW: 324.0000 mg | CHEWABLE_TABLET | Freq: Every day | ORAL | Status: DC

## 2019-12-05 MED ORDER — SODIUM CHLORIDE 0.9% FLUSH: 3.0000 mL | INTRAVENOUS | Status: DC | PRN

## 2019-12-05 MED ORDER — INSULIN REGULAR(HUMAN) IN NACL 100-0.9 UT/100ML-% IV SOLN
INTRAVENOUS | Status: DC
  Administered 2019-12-05: 15:00:00 4.2 [IU]/h via INTRAVENOUS
  Administered 2019-12-06: 5.5 [IU]/h via INTRAVENOUS
  Filled 2019-12-05: qty 100

## 2019-12-05 MED ORDER — ROCURONIUM BROMIDE 100 MG/10ML IV SOLN
INTRAVENOUS | Status: DC | PRN
  Administered 2019-12-05 (×3): 50 mg via INTRAVENOUS
  Administered 2019-12-05: 100 mg via INTRAVENOUS
  Administered 2019-12-05 (×2): 50 mg via INTRAVENOUS

## 2019-12-05 MED ORDER — NITROGLYCERIN IN D5W 200-5 MCG/ML-% IV SOLN
0.0000 ug/min | INTRAVENOUS | Status: DC
  Administered 2019-12-05: 15:00:00 35 ug/min via INTRAVENOUS

## 2019-12-05 MED ORDER — POTASSIUM CHLORIDE 10 MEQ/50ML IV SOLN: 10.0000 meq | INTRAVENOUS | Status: AC

## 2019-12-05 MED ORDER — PROPOFOL 10 MG/ML IV BOLUS
INTRAVENOUS | Status: DC | PRN
  Administered 2019-12-05: 170 mg via INTRAVENOUS

## 2019-12-05 MED ORDER — PHENYLEPHRINE HCL-NACL 20-0.9 MG/250ML-% IV SOLN
0.0000 ug/min | INTRAVENOUS | Status: DC
  Administered 2019-12-05: 16:00:00 0 ug/min via INTRAVENOUS

## 2019-12-05 MED ORDER — CHLORHEXIDINE GLUCONATE 4 % EX LIQD: 30.0000 mL | CUTANEOUS | Status: DC

## 2019-12-05 MED ORDER — DOCUSATE SODIUM 100 MG PO CAPS
200.0000 mg | ORAL_CAPSULE | Freq: Every day | ORAL | Status: DC
  Administered 2019-12-06 – 2019-12-09 (×4): 200 mg via ORAL
  Filled 2019-12-05 (×6): qty 2

## 2019-12-05 MED ORDER — LACTATED RINGERS IV SOLN: 500.0000 mL | Freq: Once | INTRAVENOUS | Status: DC | PRN

## 2019-12-05 MED ORDER — TRAMADOL HCL 50 MG PO TABS
50.0000 mg | ORAL_TABLET | ORAL | Status: DC | PRN
  Administered 2019-12-06 (×4): 100 mg via ORAL
  Filled 2019-12-05 (×4): qty 2

## 2019-12-05 MED ORDER — MAGNESIUM SULFATE 4 GM/100ML IV SOLN
4.0000 g | Freq: Once | INTRAVENOUS | Status: AC
  Administered 2019-12-05: 15:00:00 4 g via INTRAVENOUS
  Filled 2019-12-05: qty 100

## 2019-12-05 MED ORDER — LACTATED RINGERS IV SOLN: INTRAVENOUS | Status: DC

## 2019-12-05 MED ORDER — FENTANYL CITRATE (PF) 250 MCG/5ML IJ SOLN
INTRAMUSCULAR | Status: AC
  Filled 2019-12-05: qty 20

## 2019-12-05 MED ORDER — MIDAZOLAM HCL 2 MG/2ML IJ SOLN: 2.0000 mg | INTRAMUSCULAR | Status: DC | PRN

## 2019-12-05 MED ORDER — SODIUM CHLORIDE 0.9 % IR SOLN
Status: DC | PRN
  Administered 2019-12-05: 3000 mL

## 2019-12-05 MED ORDER — ARTIFICIAL TEARS OPHTHALMIC OINT
TOPICAL_OINTMENT | OPHTHALMIC | Status: AC
  Filled 2019-12-05: qty 3.5

## 2019-12-05 MED ORDER — MIDAZOLAM HCL (PF) 10 MG/2ML IJ SOLN
INTRAMUSCULAR | Status: AC
  Filled 2019-12-05: qty 2

## 2019-12-05 MED ORDER — ALBUTEROL SULFATE HFA 108 (90 BASE) MCG/ACT IN AERS
INHALATION_SPRAY | RESPIRATORY_TRACT | Status: DC | PRN
  Administered 2019-12-05: 4 via RESPIRATORY_TRACT

## 2019-12-05 MED ORDER — DEXTROSE 50 % IV SOLN: 0.0000 mL | INTRAVENOUS | Status: DC | PRN

## 2019-12-05 MED ORDER — BISACODYL 5 MG PO TBEC
10.0000 mg | DELAYED_RELEASE_TABLET | Freq: Every day | ORAL | Status: DC
  Administered 2019-12-06 – 2019-12-09 (×4): 10 mg via ORAL
  Filled 2019-12-05 (×6): qty 2

## 2019-12-05 MED ORDER — SODIUM CHLORIDE 0.9% FLUSH
10.0000 mL | Freq: Two times a day (BID) | INTRAVENOUS | Status: DC
  Administered 2019-12-05: 10 mL
  Administered 2019-12-06: 20 mL
  Administered 2019-12-06 – 2019-12-08 (×3): 10 mL

## 2019-12-05 MED ORDER — ACETAMINOPHEN 650 MG RE SUPP
650.0000 mg | Freq: Once | RECTAL | Status: AC
  Administered 2019-12-05: 15:00:00 650 mg via RECTAL

## 2019-12-05 MED ORDER — SODIUM CHLORIDE 0.9% FLUSH
3.0000 mL | Freq: Two times a day (BID) | INTRAVENOUS | Status: DC
  Administered 2019-12-06 – 2019-12-08 (×4): 3 mL via INTRAVENOUS

## 2019-12-05 MED ORDER — DEXMEDETOMIDINE HCL IN NACL 400 MCG/100ML IV SOLN
0.0000 ug/kg/h | INTRAVENOUS | Status: DC
  Administered 2019-12-05: 15:00:00 0.7 ug/kg/h via INTRAVENOUS

## 2019-12-05 MED ORDER — SUCCINYLCHOLINE CHLORIDE 20 MG/ML IJ SOLN
INTRAMUSCULAR | Status: DC | PRN
  Administered 2019-12-05: 200 mg via INTRAVENOUS

## 2019-12-05 MED ORDER — METOPROLOL TARTRATE 5 MG/5ML IV SOLN
2.5000 mg | INTRAVENOUS | Status: DC | PRN
  Administered 2019-12-06: 5 mg via INTRAVENOUS
  Filled 2019-12-05: qty 5

## 2019-12-05 MED ORDER — MIDAZOLAM HCL 5 MG/5ML IJ SOLN
INTRAMUSCULAR | Status: DC | PRN
  Administered 2019-12-05: 1 mg via INTRAVENOUS
  Administered 2019-12-05: 2 mg via INTRAVENOUS
  Administered 2019-12-05 (×3): 1 mg via INTRAVENOUS
  Administered 2019-12-05 (×2): 2 mg via INTRAVENOUS

## 2019-12-05 MED ORDER — ATORVASTATIN CALCIUM 40 MG PO TABS: 40.0000 mg | ORAL_TABLET | Freq: Every day | ORAL | Status: DC

## 2019-12-05 MED ORDER — SODIUM CHLORIDE 0.9 % IV SOLN
1.5000 g | Freq: Two times a day (BID) | INTRAVENOUS | Status: AC
  Administered 2019-12-05 – 2019-12-07 (×4): 1.5 g via INTRAVENOUS
  Filled 2019-12-05 (×4): qty 1.5

## 2019-12-05 MED ORDER — ALBUMIN HUMAN 5 % IV SOLN
250.0000 mL | INTRAVENOUS | Status: DC | PRN
  Administered 2019-12-05: 15:00:00 12.5 g via INTRAVENOUS

## 2019-12-05 MED ORDER — SODIUM CHLORIDE (PF) 0.9 % IJ SOLN
OROMUCOSAL | Status: DC | PRN
  Administered 2019-12-05 (×4): 4 mL via TOPICAL

## 2019-12-05 MED ORDER — FAMOTIDINE IN NACL 20-0.9 MG/50ML-% IV SOLN
20.0000 mg | Freq: Two times a day (BID) | INTRAVENOUS | Status: DC
  Administered 2019-12-05: 15:00:00 20 mg via INTRAVENOUS

## 2019-12-05 MED ORDER — SODIUM CHLORIDE 0.9 % IV SOLN
INTRAVENOUS | Status: DC | PRN
  Administered 2019-12-05: 750 mg via INTRAVENOUS

## 2019-12-05 MED ORDER — SODIUM CHLORIDE 0.9% FLUSH: 10.0000 mL | INTRAVENOUS | Status: DC | PRN

## 2019-12-05 MED ORDER — PHENYLEPHRINE HCL (PRESSORS) 10 MG/ML IV SOLN
INTRAVENOUS | Status: DC | PRN
  Administered 2019-12-05: 40 ug via INTRAVENOUS
  Administered 2019-12-05 (×4): 80 ug via INTRAVENOUS
  Administered 2019-12-05: 160 ug via INTRAVENOUS

## 2019-12-05 MED ORDER — ACETAMINOPHEN 160 MG/5ML PO SOLN: 1000.0000 mg | Freq: Four times a day (QID) | ORAL | Status: DC

## 2019-12-05 MED ORDER — ACETAMINOPHEN 160 MG/5ML PO SOLN: 650.0000 mg | Freq: Once | ORAL | Status: AC

## 2019-12-05 MED ORDER — ALBUMIN HUMAN 5 % IV SOLN: INTRAVENOUS | Status: DC | PRN

## 2019-12-05 MED ORDER — MORPHINE SULFATE (PF) 2 MG/ML IV SOLN
1.0000 mg | INTRAVENOUS | Status: DC | PRN
  Administered 2019-12-05 – 2019-12-08 (×6): 2 mg via INTRAVENOUS
  Filled 2019-12-05 (×6): qty 1

## 2019-12-05 MED ORDER — ORAL CARE MOUTH RINSE
15.0000 mL | Freq: Two times a day (BID) | OROMUCOSAL | Status: DC
  Administered 2019-12-05 – 2019-12-12 (×9): 15 mL via OROMUCOSAL

## 2019-12-05 MED ORDER — PANTOPRAZOLE SODIUM 40 MG PO TBEC
40.0000 mg | DELAYED_RELEASE_TABLET | Freq: Every day | ORAL | Status: DC
  Administered 2019-12-07 – 2019-12-12 (×6): 40 mg via ORAL
  Filled 2019-12-05 (×6): qty 1

## 2019-12-05 MED ORDER — CHLORHEXIDINE GLUCONATE 0.12 % MT SOLN
15.0000 mL | Freq: Once | OROMUCOSAL | Status: AC
  Administered 2019-12-05: 07:00:00 15 mL via OROMUCOSAL
  Filled 2019-12-05: qty 15

## 2019-12-05 MED ORDER — PHENYLEPHRINE 40 MCG/ML (10ML) SYRINGE FOR IV PUSH (FOR BLOOD PRESSURE SUPPORT)
PREFILLED_SYRINGE | INTRAVENOUS | Status: AC
  Filled 2019-12-05: qty 10

## 2019-12-05 MED ORDER — ASPIRIN EC 325 MG PO TBEC: 325.0000 mg | DELAYED_RELEASE_TABLET | Freq: Every day | ORAL | Status: DC

## 2019-12-05 MED ORDER — OXYCODONE HCL 5 MG PO TABS
5.0000 mg | ORAL_TABLET | ORAL | Status: DC | PRN
  Administered 2019-12-05 – 2019-12-09 (×7): 10 mg via ORAL
  Filled 2019-12-05 (×7): qty 2

## 2019-12-05 MED ORDER — PROTAMINE SULFATE 10 MG/ML IV SOLN
INTRAVENOUS | Status: AC
  Filled 2019-12-05: qty 50

## 2019-12-05 MED ORDER — PROPOFOL 10 MG/ML IV BOLUS
INTRAVENOUS | Status: AC
  Filled 2019-12-05: qty 40

## 2019-12-05 MED ORDER — ONDANSETRON HCL 4 MG/2ML IJ SOLN
INTRAMUSCULAR | Status: DC | PRN
  Administered 2019-12-05: 4 mg via INTRAVENOUS

## 2019-12-05 MED ORDER — PROTAMINE SULFATE 10 MG/ML IV SOLN
INTRAVENOUS | Status: DC | PRN
  Administered 2019-12-05: 400 mg via INTRAVENOUS

## 2019-12-05 MED ORDER — ONDANSETRON HCL 4 MG/2ML IJ SOLN
4.0000 mg | Freq: Four times a day (QID) | INTRAMUSCULAR | Status: DC | PRN
  Administered 2019-12-06: 06:00:00 4 mg via INTRAVENOUS
  Filled 2019-12-05: qty 2

## 2019-12-05 MED ORDER — VANCOMYCIN HCL IN DEXTROSE 1-5 GM/200ML-% IV SOLN
1000.0000 mg | Freq: Once | INTRAVENOUS | Status: AC
  Administered 2019-12-05: 1000 mg via INTRAVENOUS
  Filled 2019-12-05: qty 200

## 2019-12-05 MED ORDER — CHLORHEXIDINE GLUCONATE 0.12 % MT SOLN
15.0000 mL | OROMUCOSAL | Status: AC
  Administered 2019-12-05: 15 mL via OROMUCOSAL

## 2019-12-05 MED ORDER — BISACODYL 10 MG RE SUPP: 10.0000 mg | Freq: Every day | RECTAL | Status: DC

## 2019-12-05 SURGICAL SUPPLY — 101 items
ADAPTER CARDIO PERF ANTE/RETRO (ADAPTER) ×3 IMPLANT
APPLICATOR COTTON TIP 6 STRL (MISCELLANEOUS) IMPLANT
APPLICATOR COTTON TIP 6IN STRL (MISCELLANEOUS) ×3
BLADE CLIPPER SURG (BLADE) ×3 IMPLANT
BLADE CORE FAN STRYKER (BLADE) ×1 IMPLANT
BLADE STERNUM SYSTEM 6 (BLADE) ×3 IMPLANT
BLADE SURG 11 STRL SS (BLADE) ×3 IMPLANT
BOOT SUTURE AID YELLOW STND (SUTURE) ×1 IMPLANT
CANISTER SUCT 3000ML PPV (MISCELLANEOUS) ×3 IMPLANT
CANNULA EZ GLIDE AORTIC 21FR (CANNULA) ×3 IMPLANT
CANNULA GUNDRY RCSP 15FR (MISCELLANEOUS) ×3 IMPLANT
CANNULA OPTISITE PERFUSION 20F (CANNULA) ×1 IMPLANT
CANNULA VENNOUS METAL TIP 20FR (CANNULA) ×1 IMPLANT
CATH CPB KIT OWEN (MISCELLANEOUS) ×3 IMPLANT
CATH HEART VENT LEFT (CATHETERS) ×2 IMPLANT
CATH THORACIC 36FR RT ANG (CATHETERS) ×3 IMPLANT
CNTNR URN SCR LID CUP LEK RST (MISCELLANEOUS) IMPLANT
CONNECTOR 1/2X3/8X1/2 3 WAY (MISCELLANEOUS) ×3
CONNECTOR 1/2X3/8X1/2 3WAY (MISCELLANEOUS) IMPLANT
CONT SPEC 4OZ STRL OR WHT (MISCELLANEOUS) ×6
COVER PROBE W GEL 5X96 (DRAPES) ×1 IMPLANT
COVER SURGICAL LIGHT HANDLE (MISCELLANEOUS) ×3 IMPLANT
DEFOGGER ANTIFOG KIT (MISCELLANEOUS) ×1 IMPLANT
DERMABOND ADVANCED (GAUZE/BANDAGES/DRESSINGS) ×1
DERMABOND ADVANCED .7 DNX12 (GAUZE/BANDAGES/DRESSINGS) IMPLANT
DEVICE CLOSURE PERCLS PRGLD 6F (VASCULAR PRODUCTS) IMPLANT
DEVICE SUT CK QUICK LOAD INDV (Prosthesis & Implant Heart) ×3 IMPLANT
DEVICE SUT CK QUICK LOAD MINI (Prosthesis & Implant Heart) ×1 IMPLANT
DRAIN CHANNEL 32F RND 10.7 FF (WOUND CARE) ×3 IMPLANT
DRAPE INCISE IOBAN 66X45 STRL (DRAPES) ×8 IMPLANT
DRAPE PERI GROIN 82X75IN TIB (DRAPES) IMPLANT
DRAPE SLUSH/WARMER DISC (DRAPES) ×3 IMPLANT
DRSG AQUACEL AG ADV 3.5X14 (GAUZE/BANDAGES/DRESSINGS) ×3 IMPLANT
ELECT REM PT RETURN 9FT ADLT (ELECTROSURGICAL) ×6
ELECTRODE REM PT RTRN 9FT ADLT (ELECTROSURGICAL) ×4 IMPLANT
FELT TEFLON 1X6 (MISCELLANEOUS) ×6 IMPLANT
FEMORAL VENOUS CANN RAP (CANNULA) ×1 IMPLANT
FIBERTAPE STERNAL CLSR 2 36IN (Sternal Fixation) ×3 IMPLANT
FIBERTAPE STERNAL CLSR 2X36 (Sternal Fixation) ×2 IMPLANT
GAUZE SPONGE 4X4 12PLY STRL (GAUZE/BANDAGES/DRESSINGS) ×5 IMPLANT
GLOVE BIO SURGEON STRL SZ 6 (GLOVE) ×1 IMPLANT
GLOVE BIO SURGEON STRL SZ 6.5 (GLOVE) ×5 IMPLANT
GLOVE BIOGEL PI IND STRL 6 (GLOVE) IMPLANT
GLOVE BIOGEL PI IND STRL 6.5 (GLOVE) IMPLANT
GLOVE BIOGEL PI IND STRL 8.5 (GLOVE) IMPLANT
GLOVE BIOGEL PI INDICATOR 6 (GLOVE) ×2
GLOVE BIOGEL PI INDICATOR 6.5 (GLOVE) ×3
GLOVE BIOGEL PI INDICATOR 8.5 (GLOVE) ×2
GLOVE ORTHO TXT STRL SZ7.5 (GLOVE) ×10 IMPLANT
GLOVE SURG SS PI 6.0 STRL IVOR (GLOVE) ×1 IMPLANT
GOWN STRL REUS W/ TWL LRG LVL3 (GOWN DISPOSABLE) ×8 IMPLANT
GOWN STRL REUS W/TWL LRG LVL3 (GOWN DISPOSABLE) ×12
GRASPER SUT TROCAR 14GX15 (MISCELLANEOUS) ×1 IMPLANT
HEMOSTAT POWDER SURGIFOAM 1G (HEMOSTASIS) ×9 IMPLANT
INSERT FOGARTY XLG (MISCELLANEOUS) ×3 IMPLANT
KIT BASIN OR (CUSTOM PROCEDURE TRAY) ×3 IMPLANT
KIT DILATOR VASC 18G NDL (KITS) ×1 IMPLANT
KIT DRAINAGE VACCUM ASSIST (KITS) ×1 IMPLANT
KIT SUCTION CATH 14FR (SUCTIONS) ×11 IMPLANT
KIT SUT CK MINI COMBO 4X17 (Prosthesis & Implant Heart) ×1 IMPLANT
KIT TURNOVER KIT B (KITS) ×3 IMPLANT
LEAD PACING MYOCARDI (MISCELLANEOUS) ×3 IMPLANT
LINE VENT (MISCELLANEOUS) ×1 IMPLANT
NDL SUT PASSING CERCLAG MED (SUTURE) IMPLANT
NDL SUT PASSING CERCLAGE MED (SUTURE) ×3
NEEDLE SUT PASSING CERCLAG MED (SUTURE) ×2 IMPLANT
NS IRRIG 1000ML POUR BTL (IV SOLUTION) ×15 IMPLANT
PACK E OPEN HEART (SUTURE) ×3 IMPLANT
PACK OPEN HEART (CUSTOM PROCEDURE TRAY) ×3 IMPLANT
PAD ARMBOARD 7.5X6 YLW CONV (MISCELLANEOUS) ×6 IMPLANT
PERCLOSE PROGLIDE 6F (VASCULAR PRODUCTS) ×6
POSITIONER HEAD DONUT 9IN (MISCELLANEOUS) ×3 IMPLANT
SET CARDIOPLEGIA MPS 5001102 (MISCELLANEOUS) ×1 IMPLANT
SET IRRIG TUBING LAPAROSCOPIC (IRRIGATION / IRRIGATOR) ×3 IMPLANT
SHEATH BRITE TIP 7FR 35CM (SHEATH) ×2 IMPLANT
SHEATH PINNACLE 8F 10CM (SHEATH) ×1 IMPLANT
SUT BONE WAX W31G (SUTURE) ×3 IMPLANT
SUT ETHIBON 2 0 V 52N 30 (SUTURE) ×7 IMPLANT
SUT ETHIBOND 4 0 RB 1 (SUTURE) ×2 IMPLANT
SUT ETHIBOND X763 2 0 SH 1 (SUTURE) ×11 IMPLANT
SUT MNCRL AB 3-0 PS2 18 (SUTURE) ×6 IMPLANT
SUT PDS AB 1 CTX 36 (SUTURE) ×6 IMPLANT
SUT PROLENE 3 0 SH1 36 (SUTURE) ×7 IMPLANT
SUT PROLENE 4 0 RB 1 (SUTURE) ×27
SUT PROLENE 4 0 SH DA (SUTURE) ×4 IMPLANT
SUT PROLENE 4-0 RB1 .5 CRCL 36 (SUTURE) ×4 IMPLANT
SUT PROLENE 6 0 C 1 30 (SUTURE) ×2 IMPLANT
SUT SILK  1 MH (SUTURE) ×12
SUT SILK 1 MH (SUTURE) ×2 IMPLANT
SYSTEM SAHARA CHEST DRAIN ATS (WOUND CARE) ×3 IMPLANT
TAPE CLOTH SOFT 2X10 (GAUZE/BANDAGES/DRESSINGS) ×1 IMPLANT
TAPE CLOTH SURG 4X10 WHT LF (GAUZE/BANDAGES/DRESSINGS) ×1 IMPLANT
TOWEL GREEN STERILE (TOWEL DISPOSABLE) ×3 IMPLANT
TOWEL GREEN STERILE FF (TOWEL DISPOSABLE) ×3 IMPLANT
TRAY FOLEY SLVR 16FR TEMP STAT (SET/KITS/TRAYS/PACK) ×3 IMPLANT
TUBE SUCT INTRACARD DLP 20F (MISCELLANEOUS) ×1 IMPLANT
UNDERPAD 30X30 (UNDERPADS AND DIAPERS) ×3 IMPLANT
VALVE AORTIC TOP HAT (Prosthesis & Implant Heart) ×3 IMPLANT
VALVE AORTIC TOP HAT 25 (Prosthesis & Implant Heart) IMPLANT
VENT LEFT HEART 12002 (CATHETERS) ×3
WATER STERILE IRR 1000ML POUR (IV SOLUTION) ×6 IMPLANT

## 2019-12-05 NOTE — Interval H&P Note (Signed)
History and Physical Interval Note:  12/05/2019 7:03 AM  Adam Long  has presented today for surgery, with the diagnosis of AS.  The various methods of treatment have been discussed with the patient and family. After consideration of risks, benefits and other options for treatment, the patient has consented to  Procedure(s) with comments: AORTIC VALVE REPLACEMENT (AVR) (N/A) - No neck lines on left TRANSESOPHAGEAL ECHOCARDIOGRAM (TEE) (N/A) as a surgical intervention.  The patient's history has been reviewed, patient examined, no change in status, stable for surgery.  I have reviewed the patient's chart and labs.  Questions were answered to the patient's satisfaction.     Purcell Nails

## 2019-12-05 NOTE — Op Note (Signed)
CARDIOTHORACIC SURGERY OPERATIVE NOTE  Date of Procedure:  12/05/2019  Preoperative Diagnosis:   Bicuspid Aortic Valve  Severe Aortic Stenosis   Moderate Aortic Insufficiency  Postoperative Diagnosis: Same   Procedure:    Aortic Valve Replacement  Partial upper hemi-sternotomy  Carbomedics Top Hat bileaflet mechanical valve (size 25 mm, cat # [id number], serial # [id number])   Surgeon: Salvatore Decent. Cornelius Moras, MD  Assistant: Jari Favre, PA-C  Anesthesia: Heather Roberts, MD  Operative Findings:  Adam Long type 0 bicuspid aortic valve  Severe aortic stenosis  Normal left ventricular systolic function  Moderate-severe pulmonary hypertension         BRIEF CLINICAL NOTE AND INDICATIONS FOR SURGERY  Patient is a 44 year old morbidly obese male with history of likely bicuspid aortic valve with aortic stenosis,hypertension, type 2 diabetes mellitus, anxiety and depression who has been referred for surgical consultation to discuss treatment options for management of severe symptomatic aortic stenosis.  Patient states that he was first told that he had a heart murmur when he was hospitalized in September 2020 for cellulitis involving his left foot. Transthoracic echocardiogram performed at that time revealed likely bicuspid aortic valve with severe aortic stenosis. Peak velocity across aortic valve measured 4.5 m/s corresponding to mean transvalvular gradient estimated 49.5 mmHg.No aortic insufficiency was noted and the DVI was reported 0.24. Left ventricular systolic function appeared normal with ejection fraction estimated 55 to 60%. 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 Once he completely recovered he was referred to the multidisciplinary heart valve clinic where he has been seen previously by Dr. Excell Seltzer. Catheterization revealed mild nonobstructive coronary artery disease with exception of moderate stenosis of the mid left  anterior descending coronary artery. There was severe pulmonary hypertension. CT angiography has been performed and the patient was referred for surgical consultation.  The patient has been seen in consultation and counseled at length regarding the indications, risks and potential benefits of surgery.  All questions have been answered, and the patient provides full informed consent for the operation as described.    DETAILS OF THE OPERATIVE PROCEDURE  Preparation:  The patient is brought to the operating room on the above mentioned date and central monitoring was established by the anesthesia team including placement of Swan-Ganz catheter and radial arterial line.  There was moderate to severe pulmonary hypertension at baseline.  The patient is placed in the supine position on the operating table.  Intravenous antibiotics are administered. General endotracheal anesthesia is induced uneventfully. A Foley catheter is placed.  Baseline transesophageal echocardiogram was performed.  Findings were notable for congenitally bicuspid aortic valve with severe aortic stenosis.  There was moderate aortic insufficiency.  The jet of insufficiency was eccentric.  There was normal left ventricular systolic function with significant left ventricular hypertrophy.  There was mild central mitral regurgitation.  The patient's chest, abdomen, both groins, and both lower extremities are prepared and draped in a sterile manner. A time out procedure is performed.   Percutaneous Vascular Access:  Percutaneous venous access were obtained on the right side.  Using ultrasound guidance the right common femoral vein was cannulated using the Seldinger technique and a pair of Perclose vascular closure devises were placed at opposing 30 degree angles, after which time an 8 French sheath inserted.     Surgical Approach:  A partial upper hemi-sternotomy incision was performed.  The sternum was divided from the sternal notch  down the midline to the level of the third intercostal space  and completed in a J into the right fourth intercostal space.  The pericardium is opened. The ascending aorta is normal in appearance.    Extracorporeal Cardiopulmonary Bypass and Myocardial Protection:  The patient was heparinized systemically.  The right common femoral vein is cannulated through the venous sheath and a guidewire advanced into the right atrium using TEE guidance.  The femoral vein cannulated using a 23/25 Fr long femoral venous cannula.  The ascending aorta is cannulated for cardiopulmonary bypass.  Adequate heparinization is verified.  The operative field was continuously flooded with carbon dioxide gas.  The entire pre-bypass portion of the operation was notable for stable hemodynamics.  Cardiopulmonary bypass was begun.  A retrograde cardioplegia cannula is placed through the right atrium into the coronary sinus.  A second venous cannula is placed directly in the superior vena cava.  A left ventricular vent is placed through the right superior pulmonary vein.  A cardioplegia cannula is placed in the ascending aorta.    The patient is cooled passively to 32C systemic temperature.  The aortic cross clamp is applied and cardioplegia is delivered initially in an antegrade fashion through the aortic root using modified [place] cold blood cardioplegia (Kennestone blood cardioplegia protocol).   Once the patient reaches ventricular fibrillation cardioplegia is delivered retrograde through the coronary sinus catheter because of the patient's significant aortic insufficiency.  The initial cardioplegic arrest is rapid with early diastolic arrest.  Myocardial protection was felt to be excellent.   Aortic Valve Replacement:  A low oblique transverse aortotomy incision was performed.  The aortic valve was inspected and notable for Sievers type 0 congenitally bicuspid aortic valve.  There was no raphae and 2 leaflets with  commissures 180 degrees opposed.  There was severe aortic stenosis.  The aortic valve leaflets were excised sharply and the aortic annulus decalcified.  Decalcification was notably straightforward although exposure challenging because of the patient's body habitus.  The coronary arteries are identified.  The aortic annulus was sized to accept a 25 mm prosthesis.  The aortic root and left ventricle were irrigated with copious cold saline solution.  Aortic valve replacement was performed using interrupted horizontal mattress 2-0 Ethibond pledgeted sutures with pledgets in the subannular position.  A Carbomedics top hat bileaflet mechanical valve (size 25 mm, cat #[id number], serial # [id number]) was implanted uneventfully. All sutures were secured using a Cor-knot device.  The valve seated appropriately with adequate space beneath the left main and right coronary artery.   Procedure Completion:  The aortotomy was closed using a 2-layer closure of running 4-0 Prolene suture.  One final dose of warm retrograde "reanimation dose" cardioplegia was administered retrograde through the coronary sinus catheter while all air was evacuated through the aortic root.  The aortic cross clamp was removed after a total cross clamp time of 101 minutes.  Epicardial pacing wires are fixed to the right ventricular outflow tract and to the right atrial appendage. The patient is rewarmed to 37C temperature. The aortic and left ventricular vents are removed.  The patient is weaned and disconnected from cardiopulmonary bypass.  The patient's rhythm at separation from bypass was AV paced.  The patient was weaned from cardiopulmonary bypass without any inotropic support. Total cardiopulmonary bypass time for the operation was 140 minutes.  Followup transesophageal echocardiogram performed after separation from bypass revealed a well-seated aortic valve prosthesis that was functioning normally and without any sign of paravalvular leak.   Left ventricular function was unchanged from preoperatively.  The  aortic cannula was removed uneventfully.  The superior vena cava cannula was removed.  Protamine was administered to reverse the anticoagulation.  The femoral venous cannula was removed and Perclose sutures secured.  Manual pressure held on the groin for 20 minutes.  The mediastinum was inspected for hemostasis and irrigated with saline solution. The mediastinum and the right pleural space were drained using 2 chest tubes placed through separate stab incisions inferiorly.  The soft tissues anterior to the aorta were reapproximated loosely. The sternum is closed with double strength sternal wire. The soft tissues anterior to the sternum were closed in multiple layers and the skin is closed with a running subcuticular skin closure.  The post-bypass portion of the operation was notable for stable rhythm and hemodynamics.  No blood products were administered during the operation.   Disposition:  The patient tolerated the procedure well and is transported to the surgical intensive care in stable condition. There are no intraoperative complications. All sponge instrument and needle counts are verified correct at completion of the operation.    Adam Long. Adam Manns MD 12/05/2019 2:20 PM

## 2019-12-05 NOTE — Brief Op Note (Signed)
12/05/2019  1:07 PM  PATIENT:  [name]  44 y.o. male  PRE-OPERATIVE DIAGNOSIS:  AS  POST-OPERATIVE DIAGNOSIS:  AS  PROCEDURE:  Procedure(s) with comments: AORTIC VALVE REPLACEMENT (AVR), USING CARBOMEDICS SUPRA-ANNULAR TOP HAT (N/A) - No neck lines on left TRANSESOPHAGEAL ECHOCARDIOGRAM (TEE) (N/A)  SURGEON:  Surgeon(s) and Role:    Purcell Nails, MD - Primary  PHYSICIAN ASSISTANT:   Jari Favre, PA-C  ANESTHESIA:   general  EBL:  1100 mL  BLOOD ADMINISTERED:none  DRAINS: ROUTINE   LOCAL MEDICATIONS USED:  NONE  SPECIMEN:  Source of Specimen:  AORTIC VALVE LEAFLETS  DISPOSITION OF SPECIMEN:  PATHOLOGY  COUNTS:  YES  DICTATION: .Dragon Dictation  PLAN OF CARE: Admit to inpatient   PATIENT DISPOSITION:  ICU - intubated and hemodynamically stable.   Delay start of Pharmacological VTE agent (>24hrs) due to surgical blood loss or risk of bleeding: yes

## 2019-12-05 NOTE — Transfer of Care (Signed)
Immediate Anesthesia Transfer of Care Note  Patient: Adam Long  Procedure(s) Performed: PARTIAL STERNOTOMY FOR AORTIC VALVE REPLACEMENT (AVR), USING CARBOMEDICS SUPRA-ANNULAR TOP HAT (N/A Chest) TRANSESOPHAGEAL ECHOCARDIOGRAM (TEE) (N/A )  Patient Location: ICU  Anesthesia Type:General  Level of Consciousness: Patient remains intubated per anesthesia plan  Airway & Oxygen Therapy: Patient remains intubated per anesthesia plan and Patient placed on Ventilator (see vital sign flow sheet for setting)  Post-op Assessment: Report given to RN and Post -op Vital signs reviewed and stable  Post vital signs: Reviewed and stable  Last Vitals:  Vitals Value Taken Time  BP 150/78 12/05/19 1459  Temp 36.7 C 12/05/19 1505  Pulse 97 12/05/19 1505  Resp 14 12/05/19 1505  SpO2 96 % 12/05/19 1505  Vitals shown include unvalidated device data.  Last Pain:  Vitals:   12/05/19 0703  PainSc: 0-No pain         Complications: No apparent anesthesia complications

## 2019-12-05 NOTE — Anesthesia Procedure Notes (Signed)
Arterial Line Insertion Performed by: Heather Roberts, MD, Colon Flattery, CRNA, CRNA  Patient sedated Right, radial was placed Catheter size: 20 G Hand hygiene performed  and maximum sterile barriers used   Attempts: 2 Procedure performed without using ultrasound guided technique. Following insertion, dressing applied and Biopatch. Post procedure assessment: normal  Patient tolerated the procedure well with no immediate complications.

## 2019-12-05 NOTE — Anesthesia Postprocedure Evaluation (Signed)
Anesthesia Post Note  Patient: Adam Long  Procedure(s) Performed: PARTIAL STERNOTOMY FOR AORTIC VALVE REPLACEMENT (AVR), USING CARBOMEDICS SUPRA-ANNULAR TOP HAT (N/A Chest) TRANSESOPHAGEAL ECHOCARDIOGRAM (TEE) (N/A )     Patient location during evaluation: SICU Anesthesia Type: General Level of consciousness: sedated Pain management: pain level controlled Vital Signs Assessment: post-procedure vital signs reviewed and stable Respiratory status: patient remains intubated per anesthesia plan Cardiovascular status: stable Postop Assessment: no apparent nausea or vomiting Anesthetic complications: no    Last Vitals:  Vitals:   12/05/19 1545 12/05/19 1600  BP:  110/66  Pulse: 90 88  Resp: 18 17  Temp: (!) 36.4 C (!) 36.3 C  SpO2: 97% 97%    Last Pain:  Vitals:   12/05/19 1500  TempSrc: Core (Comment)  PainSc:                  Adam Long DANIEL

## 2019-12-05 NOTE — Procedures (Signed)
Extubation Procedure Note  Patient Details:   Name: NIC LAMPE DOB: 1975-12-28 MRN: [id number]   Airway Documentation:    Vent end date: 12/05/19 Vent end time: 1829   Evaluation  O2 sats: stable throughout Complications: No apparent complications Patient did tolerate procedure well. Bilateral Breath Sounds: Diminished, Rhonchi   Yes   Patient was extubated to a 4L [place] without any complications, dyspnea or stridor noted. Patient was instructed on IS x 5, highest goal reached was . NIF: -20, VC: .9L, positive cuff leak.   Carlynn Spry 12/05/2019, 6:29 PM

## 2019-12-05 NOTE — Progress Notes (Signed)
Patient ID: Adam Long, male   DOB: July 22, 1976, 43 y.o.   MRN: [id number] EVENING ROUNDS NOTE :     [address]       [name] [address]             [phone]                 Day of Surgery Procedure(s) (LRB): PARTIAL STERNOTOMY FOR AORTIC VALVE REPLACEMENT (AVR), USING CARBOMEDICS SUPRA-ANNULAR TOP HAT (N/A) TRANSESOPHAGEAL ECHOCARDIOGRAM (TEE) (N/A)  Total Length of Stay:  LOS: 0 days  BP 110/66   Pulse 84   Temp (!) 97 F (36.1 C)   Resp (!) 22   Ht 5\' 10"  (1.778 m)   Wt (!) 167.8 kg   SpO2 96%   BMI 53.09 kg/m   .Intake/Output      03/09 0701 - 03/10 0700 03/10 0701 - 03/11 0700   I.V. (mL/kg)  2837.5 (16.9)   Blood  850   IV Piggyback  935.6   Total Intake(mL/kg)  4623 (27.6)   Urine (mL/kg/hr)  2625 (1.4)   Blood  2000   Chest Tube  50   Total Output  4675   Net  -52          . sodium chloride 20 mL/hr at 12/05/19 1520  . [START ON 12/06/2019] sodium chloride    . sodium chloride 10 mL/hr at 12/05/19 1549  . sodium chloride 100 mL/hr at 12/05/19 1700  . albumin human 12.5 g (12/05/19 1527)  . cefUROXime (ZINACEF)  IV 1.5 g (12/05/19 1733)  . dexmedetomidine (PRECEDEX) IV infusion 0.2 mcg/kg/hr (12/05/19 1700)  . famotidine (PEPCID) IV 20 mg (12/05/19 1520)  . insulin 4.8 mL/hr at 12/05/19 1700  . lactated ringers    . lactated ringers    . lactated ringers 20 mL/hr at 12/05/19 1700  . magnesium sulfate 20 mL/hr at 12/05/19 1700  . nitroGLYCERIN 35 mcg/min (12/05/19 1700)  . phenylephrine (NEO-SYNEPHRINE) Adult infusion 0 mcg/min (12/05/19 1539)  . vancomycin       Lab Results  Component Value Date   WBC 32.3 (H) 12/05/2019   HGB 12.6 (L) 12/05/2019   HCT 37.0 (L) 12/05/2019   PLT 192 12/05/2019   GLUCOSE 166 (H) 12/05/2019   CHOL 160 10/22/2019   TRIG 119 10/22/2019   HDL 38 (L) 10/22/2019   LDLCALC 98 10/22/2019   ALT 27 12/03/2019   AST 23 12/03/2019   NA 137 12/05/2019   K 4.4 12/05/2019   CL 102 12/05/2019    CREATININE 0.60 (L) 12/05/2019   BUN 12 12/05/2019   CO2 24 12/03/2019   INR 1.3 (H) 12/05/2019   HGBA1C 7.2 (H) 12/03/2019   MICROALBUR 18.4 (H) 06/28/2019   Aortic valve replacement today Not bleeding Waking up, neuro intact Weaning vent Sinus in 80's pacer off   08/28/2019 MD  Beeper [phone] Office [phone] 12/05/2019 5:53 PM

## 2019-12-05 NOTE — Anesthesia Procedure Notes (Signed)
Procedure Name: Intubation Date/Time: 12/05/2019 8:41 AM Performed by: Amadeo Garnet, CRNA Pre-anesthesia Checklist: Patient identified, Emergency Drugs available, Suction available and Patient being monitored Patient Re-evaluated:Patient Re-evaluated prior to induction Oxygen Delivery Method: Circle system utilized Preoxygenation: Pre-oxygenation with 100% oxygen Induction Type: IV induction Ventilation: Mask ventilation without difficulty Laryngoscope Size: Mac and 4 Grade View: Grade II Tube type: Oral Tube size: 8.0 mm Number of attempts: 1 Airway Equipment and Method: Stylet Placement Confirmation: ETT inserted through vocal cords under direct vision,  positive ETCO2 and breath sounds checked- equal and bilateral Secured at: 22 cm Tube secured with: Tape Dental Injury: Teeth and Oropharynx as per pre-operative assessment  Comments: SRNA A Jarrell intubated under CRNA supervision

## 2019-12-05 NOTE — Plan of Care (Signed)
Pt was extubated around 6pm, progressing well. Encouraging pulmonary toilet, dangled fine this evening. Will stand in AM for weight. On low dose NTG gtt. Problem: Education: Goal: Knowledge of General Education information will improve Description: Including pain rating scale, medication(s)/side effects and non-pharmacologic comfort measures 12/05/2019 2322 by Peyton Bottoms, RN Outcome: Progressing 12/05/2019 2322 by Peyton Bottoms, RN Outcome: Progressing   Problem: Health Behavior/Discharge Planning: Goal: Ability to manage health-related needs will improve 12/05/2019 2322 by Peyton Bottoms, RN Outcome: Progressing 12/05/2019 2322 by Peyton Bottoms, RN Outcome: Progressing   Problem: Clinical Measurements: Goal: Ability to maintain clinical measurements within normal limits will improve 12/05/2019 2322 by Peyton Bottoms, RN Outcome: Progressing 12/05/2019 2322 by Peyton Bottoms, RN Outcome: Progressing Goal: Will remain free from infection 12/05/2019 2322 by Peyton Bottoms, RN Outcome: Progressing 12/05/2019 2322 by Peyton Bottoms, RN Outcome: Progressing Goal: Diagnostic test results will improve 12/05/2019 2322 by Peyton Bottoms, RN Outcome: Progressing 12/05/2019 2322 by Peyton Bottoms, RN Outcome: Progressing Goal: Respiratory complications will improve 12/05/2019 2322 by Peyton Bottoms, RN Outcome: Progressing 12/05/2019 2322 by Peyton Bottoms, RN Outcome: Progressing Goal: Cardiovascular complication will be avoided 12/05/2019 2322 by Peyton Bottoms, RN Outcome: Progressing 12/05/2019 2322 by Peyton Bottoms, RN Outcome: Progressing   Problem: Activity: Goal: Risk for activity intolerance will decrease 12/05/2019 2322 by Peyton Bottoms, RN Outcome: Progressing 12/05/2019 2322 by Peyton Bottoms, RN Outcome: Progressing   Problem: Nutrition: Goal: Adequate nutrition will be maintained 12/05/2019 2322 by Peyton Bottoms, RN Outcome: Progressing 12/05/2019 2322 by Peyton Bottoms, RN Outcome: Progressing   Problem: Coping: Goal: Level of anxiety will decrease 12/05/2019 2322 by Peyton Bottoms, RN Outcome: Progressing 12/05/2019 2322 by Peyton Bottoms, RN Outcome: Progressing   Problem: Elimination: Goal: Will not experience complications related to bowel motility 12/05/2019 2322 by Peyton Bottoms, RN Outcome: Progressing 12/05/2019 2322 by Peyton Bottoms, RN Outcome: Progressing Goal: Will not experience complications related to urinary retention 12/05/2019 2322 by Peyton Bottoms, RN Outcome: Progressing 12/05/2019 2322 by Peyton Bottoms, RN Outcome: Progressing   Problem: Pain Managment: Goal: General experience of comfort will improve 12/05/2019 2322 by Peyton Bottoms, RN Outcome: Progressing 12/05/2019 2322 by Peyton Bottoms, RN Outcome: Progressing   Problem: Safety: Goal: Ability to remain free from injury will improve 12/05/2019 2322 by Peyton Bottoms, RN Outcome: Progressing 12/05/2019 2322 by Peyton Bottoms, RN Outcome: Progressing   Problem: Skin Integrity: Goal: Risk for impaired skin integrity will decrease 12/05/2019 2322 by Peyton Bottoms, RN Outcome: Progressing 12/05/2019 2322 by Peyton Bottoms, RN Outcome: Progressing

## 2019-12-05 NOTE — Anesthesia Procedure Notes (Signed)
Central Venous Catheter Insertion Performed by: Heather Roberts, MD, anesthesiologist Start/End3/06/2020 7:31 AM, 12/05/2019 7:41 AM Patient location: Pre-op. Preanesthetic checklist: patient identified, IV checked, site marked, risks and benefits discussed, surgical consent, monitors and equipment checked, pre-op evaluation, timeout performed and anesthesia consent Position: Trendelenburg Lidocaine 1% used for infiltration and patient sedated Hand hygiene performed , maximum sterile barriers used  and Seldinger technique used Catheter size: 9 Fr Total catheter length 8. PA cath was placed.Sheath introducer Swan type:thermodilution PA Cath depth:50 Procedure performed using ultrasound guided technique. Ultrasound Notes:anatomy identified, needle tip was noted to be adjacent to the nerve/plexus identified, no ultrasound evidence of intravascular and/or intraneural injection and image(s) printed for medical record Attempts: 1 Following insertion, line sutured and dressing applied. Post procedure assessment: free fluid flow, blood return through all ports and no air  Patient tolerated the procedure well with no immediate complications.

## 2019-12-06 ENCOUNTER — Encounter: Payer: Self-pay | Admitting: *Deleted

## 2019-12-06 ENCOUNTER — Inpatient Hospital Stay (HOSPITAL_COMMUNITY): Payer: Self-pay

## 2019-12-06 LAB — GLUCOSE, CAPILLARY
Glucose-Capillary: 118 mg/dL — ABNORMAL HIGH (ref 70–99)
Glucose-Capillary: 126 mg/dL — ABNORMAL HIGH (ref 70–99)
Glucose-Capillary: 130 mg/dL — ABNORMAL HIGH (ref 70–99)
Glucose-Capillary: 139 mg/dL — ABNORMAL HIGH (ref 70–99)
Glucose-Capillary: 141 mg/dL — ABNORMAL HIGH (ref 70–99)
Glucose-Capillary: 144 mg/dL — ABNORMAL HIGH (ref 70–99)
Glucose-Capillary: 145 mg/dL — ABNORMAL HIGH (ref 70–99)
Glucose-Capillary: 150 mg/dL — ABNORMAL HIGH (ref 70–99)
Glucose-Capillary: 151 mg/dL — ABNORMAL HIGH (ref 70–99)
Glucose-Capillary: 152 mg/dL — ABNORMAL HIGH (ref 70–99)
Glucose-Capillary: 152 mg/dL — ABNORMAL HIGH (ref 70–99)
Glucose-Capillary: 165 mg/dL — ABNORMAL HIGH (ref 70–99)

## 2019-12-06 LAB — BASIC METABOLIC PANEL
Anion gap: 8 (ref 5–15)
Anion gap: 10 (ref 5–15)
BUN: 11 mg/dL (ref 6–20)
BUN: 11 mg/dL (ref 6–20)
CO2: 22 mmol/L (ref 22–32)
CO2: 24 mmol/L (ref 22–32)
Calcium: 8.2 mg/dL — ABNORMAL LOW (ref 8.9–10.3)
Calcium: 7.9 mg/dL — ABNORMAL LOW (ref 8.9–10.3)
Chloride: 106 mmol/L (ref 98–111)
Chloride: 100 mmol/L (ref 98–111)
Creatinine, Ser: 0.7 mg/dL (ref 0.61–1.24)
Creatinine, Ser: 0.75 mg/dL (ref 0.61–1.24)
GFR calc Af Amer: >60 mL/min (ref >60)
GFR calc Af Amer: >60 mL/min (ref >60)
GFR calc non Af Amer: >60 mL/min (ref >60)
GFR calc non Af Amer: >60 mL/min (ref >60)
Glucose, Bld: 154 mg/dL — ABNORMAL HIGH (ref 70–99)
Glucose, Bld: 158 mg/dL — ABNORMAL HIGH (ref 70–99)
Potassium: 4.1 mmol/L (ref 3.5–5.1)
Potassium: 3.9 mmol/L (ref 3.5–5.1)
Sodium: 136 mmol/L (ref 135–145)
Sodium: 134 mmol/L — ABNORMAL LOW (ref 135–145)

## 2019-12-06 LAB — CBC
HCT: 36.2 % — ABNORMAL LOW (ref 39.0–52.0)
HCT: 34.4 % — ABNORMAL LOW (ref 39.0–52.0)
Hemoglobin: 12 g/dL — ABNORMAL LOW (ref 13.0–17.0)
Hemoglobin: 11.6 g/dL — ABNORMAL LOW (ref 13.0–17.0)
MCH: 29.7 pg (ref 26.0–34.0)
MCH: 29.9 pg (ref 26.0–34.0)
MCHC: 33.1 g/dL (ref 30.0–36.0)
MCHC: 33.7 g/dL (ref 30.0–36.0)
MCV: 89.6 fL (ref 80.0–100.0)
MCV: 88.7 fL (ref 80.0–100.0)
Platelets: 178 10*3/uL (ref 150–400)
Platelets: 166 10*3/uL (ref 150–400)
RBC: 4.04 MIL/uL — ABNORMAL LOW (ref 4.22–5.81)
RBC: 3.88 MIL/uL — ABNORMAL LOW (ref 4.22–5.81)
RDW: 13.3 % (ref 11.5–15.5)
RDW: 13.4 % (ref 11.5–15.5)
WBC: 20.6 10*3/uL — ABNORMAL HIGH (ref 4.0–10.5)
WBC: 19 10*3/uL — ABNORMAL HIGH (ref 4.0–10.5)
nRBC: 0 % (ref 0.0–0.2)
nRBC: 0 % (ref 0.0–0.2)

## 2019-12-06 LAB — SURGICAL PATHOLOGY

## 2019-12-06 LAB — MAGNESIUM
Magnesium: 2.4 mg/dL (ref 1.7–2.4)
Magnesium: 2.1 mg/dL (ref 1.7–2.4)

## 2019-12-06 MED ORDER — ASPIRIN EC 81 MG PO TBEC: 81.0000 mg | DELAYED_RELEASE_TABLET | Freq: Every day | ORAL | Status: DC

## 2019-12-06 MED ORDER — COUMADIN BOOK
Freq: Once | Status: AC
  Filled 2019-12-06: qty 1

## 2019-12-06 MED ORDER — WARFARIN SODIUM 5 MG PO TABS
5.0000 mg | ORAL_TABLET | Freq: Every day | ORAL | Status: DC
  Administered 2019-12-06 – 2019-12-08 (×3): 5 mg via ORAL
  Filled 2019-12-06 (×3): qty 1

## 2019-12-06 MED ORDER — INSULIN ASPART 100 UNIT/ML ~~LOC~~ SOLN
0.0000 [IU] | SUBCUTANEOUS | Status: DC
  Administered 2019-12-06: 2 [IU] via SUBCUTANEOUS
  Administered 2019-12-06: 4 [IU] via SUBCUTANEOUS
  Administered 2019-12-06 – 2019-12-07 (×4): 2 [IU] via SUBCUTANEOUS

## 2019-12-06 MED ORDER — KETOROLAC TROMETHAMINE 15 MG/ML IJ SOLN
15.0000 mg | Freq: Four times a day (QID) | INTRAMUSCULAR | Status: AC
  Administered 2019-12-06 – 2019-12-07 (×5): 15 mg via INTRAVENOUS
  Filled 2019-12-06 (×5): qty 1

## 2019-12-06 MED ORDER — FUROSEMIDE 10 MG/ML IJ SOLN
40.0000 mg | Freq: Two times a day (BID) | INTRAMUSCULAR | Status: DC
  Administered 2019-12-06 – 2019-12-07 (×3): 40 mg via INTRAVENOUS
  Filled 2019-12-06 (×3): qty 4

## 2019-12-06 MED ORDER — KETOROLAC TROMETHAMINE 30 MG/ML IJ SOLN
30.0000 mg | Freq: Once | INTRAMUSCULAR | Status: AC
  Administered 2019-12-06: 30 mg via INTRAVENOUS
  Filled 2019-12-06: qty 1

## 2019-12-06 MED ORDER — INSULIN DETEMIR 100 UNIT/ML ~~LOC~~ SOLN
20.0000 [IU] | Freq: Two times a day (BID) | SUBCUTANEOUS | Status: DC
  Administered 2019-12-07 – 2019-12-12 (×11): 20 [IU] via SUBCUTANEOUS
  Filled 2019-12-06 (×13): qty 0.2

## 2019-12-06 MED ORDER — ATORVASTATIN CALCIUM 40 MG PO TABS
40.0000 mg | ORAL_TABLET | Freq: Every day | ORAL | Status: DC
  Administered 2019-12-08 – 2019-12-10 (×3): 40 mg via ORAL
  Filled 2019-12-06 (×3): qty 1

## 2019-12-06 MED ORDER — WARFARIN - PHYSICIAN DOSING INPATIENT: Freq: Every day | Status: DC

## 2019-12-06 MED ORDER — ENOXAPARIN SODIUM 40 MG/0.4ML ~~LOC~~ SOLN
40.0000 mg | Freq: Every day | SUBCUTANEOUS | Status: DC
  Administered 2019-12-07 – 2019-12-11 (×5): 40 mg via SUBCUTANEOUS
  Filled 2019-12-06 (×5): qty 0.4

## 2019-12-06 MED ORDER — ASPIRIN EC 325 MG PO TBEC
325.0000 mg | DELAYED_RELEASE_TABLET | Freq: Every day | ORAL | Status: AC
  Administered 2019-12-06: 10:00:00 325 mg via ORAL
  Filled 2019-12-06: qty 1

## 2019-12-06 MED ORDER — INSULIN DETEMIR 100 UNIT/ML ~~LOC~~ SOLN
20.0000 [IU] | Freq: Once | SUBCUTANEOUS | Status: AC
  Administered 2019-12-06: 20 [IU] via SUBCUTANEOUS
  Filled 2019-12-06: qty 0.2

## 2019-12-06 NOTE — Progress Notes (Signed)
[facility] BRIEF SICU PROGRESS NOTE  1 Day Post-Op  S/P Procedure(s) (LRB): PARTIAL STERNOTOMY FOR AORTIC VALVE REPLACEMENT (AVR), USING CARBOMEDICS SUPRA-ANNULAR TOP HAT (N/A) TRANSESOPHAGEAL ECHOCARDIOGRAM (TEE) (N/A)   Stable day Expected soreness in chest NSR w/ stable hemodynamics O2 sats 95-97% Adequate UOP  Plan: Continue current plan  Purcell Nails, MD 12/06/2019 5:28 PM

## 2019-12-06 NOTE — Addendum Note (Signed)
Addendum  created 12/06/19 0640 by Adair Laundry, CRNA   Order list changed

## 2019-12-06 NOTE — Progress Notes (Addendum)
[facility] DAILY ICU PROGRESS NOTE                   [address]            [place] [address]          [phone]   1 Day Post-Op Procedure(s) (LRB): PARTIAL STERNOTOMY FOR AORTIC VALVE REPLACEMENT (AVR), USING CARBOMEDICS SUPRA-ANNULAR TOP HAT (N/A) TRANSESOPHAGEAL ECHOCARDIOGRAM (TEE) (N/A)  Total Length of Stay:  LOS: 1 day   Subjective: Some incisional pain this morning. Looking forward to being able to move better.   Objective: Vital signs in last 24 hours: Temp:  [96.8 F (36 C)-98.1 F (36.7 C)] 97.7 F (36.5 C) (03/11 0700) Pulse Rate:  [79-106] 89 (03/11 0700) Cardiac Rhythm: Normal sinus rhythm (03/11 0000) Resp:  [0-27] 20 (03/11 0700) BP: (101-150)/(56-78) 107/62 (03/11 0400) SpO2:  [90 %-100 %] 95 % (03/11 0700) FiO2 (%):  [40 %-50 %] 40 % (03/10 1750) Weight:  [177.9 kg] 177.9 kg (03/11 0500)  Filed Weights   12/05/19 0708 12/05/19 0709 12/06/19 0500  Weight: (!) 173.3 kg (!) 167.8 kg (!) 177.9 kg    Weight change:    Hemodynamic parameters for last 24 hours: PAP: (27-58)/(18-40) 38/26 CO:  [5.5 L/min-7.6 L/min] 6.9 L/min CI:  [2.1 L/min/m2-2.8 L/min/m2] 2.6 L/min/m2  Intake/Output from previous day: 03/10 0701 - 03/11 0700 In: 9371.6 [P.O.:500; I.V.:4324.1; Blood:850; IV Piggyback:1193.1] Out: 5790 [Urine:3500; Blood:2000; Chest Tube:290]  Intake/Output this shift: No intake/output data recorded.  Current Meds: Scheduled Meds: . acetaminophen  1,000 mg Oral Q6H  . aspirin EC  325 mg Oral Daily  . [START ON 12/07/2019] aspirin EC  81 mg Oral Daily  . [START ON 12/08/2019] atorvastatin  40 mg Oral Daily  . bisacodyl  10 mg Oral Daily   Or  . bisacodyl  10 mg Rectal Daily  . Chlorhexidine Gluconate Cloth  6 each Topical Daily  . docusate sodium  200 mg Oral Daily  . [START ON 12/07/2019] enoxaparin (LOVENOX) injection  40 mg Subcutaneous QHS  . insulin aspart  0-24 Units Subcutaneous Q4H  . insulin detemir  20 Units  Subcutaneous Once  . [START ON 12/07/2019] insulin detemir  20 Units Subcutaneous BID  . mouth rinse  15 mL Mouth Rinse BID  . [START ON 12/07/2019] pantoprazole  40 mg Oral Daily  . sodium chloride flush  10-40 mL Intracatheter Q12H  . sodium chloride flush  3 mL Intravenous Q12H  . warfarin  5 mg Oral q1800  . Warfarin - Physician Dosing Inpatient   Does not apply q1800   Continuous Infusions: . sodium chloride    . cefUROXime (ZINACEF)  IV Stopped (12/06/19 0537)  . insulin 4.8 mL/hr at 12/06/19 0700  . lactated ringers    . lactated ringers 20 mL/hr at 12/06/19 0700   PRN Meds:.dextrose, metoprolol tartrate, morphine injection, ondansetron (ZOFRAN) IV, oxyCODONE, sodium chloride flush, sodium chloride flush, traMADol  General appearance: alert, cooperative and no distress Heart: regular rate and rhythm, S1, S2 normal, no murmur, click, rub or gallop Lungs: clear to auscultation bilaterally, diminished in the lower lobes Abdomen: soft, non-tender; bowel sounds normal; no masses,  no organomegaly Extremities: extremities normal, atraumatic, no cyanosis or edema Wound: clean and dry  Lab Results: CBC: Recent Labs    12/05/19 2202 12/06/19 0325  WBC 19.3* 20.6*  HGB 12.4* 12.0*  HCT 37.2* 36.2*  PLT 159 178   BMET:  Recent Labs  12/05/19 2202 12/06/19 0325  NA 136 136  K 4.5 4.1  CL 106 106  CO2 23 22  GLUCOSE 179* 154*  BUN 11 11  CREATININE 0.73 0.70  CALCIUM 7.9* 8.2*    CMET: Lab Results  Component Value Date   WBC 20.6 (H) 12/06/2019   HGB 12.0 (L) 12/06/2019   HCT 36.2 (L) 12/06/2019   PLT 178 12/06/2019   GLUCOSE 154 (H) 12/06/2019   CHOL 160 10/22/2019   TRIG 119 10/22/2019   HDL 38 (L) 10/22/2019   LDLCALC 98 10/22/2019   ALT 27 12/03/2019   AST 23 12/03/2019   NA 136 12/06/2019   K 4.1 12/06/2019   CL 106 12/06/2019   CREATININE 0.70 12/06/2019   BUN 11 12/06/2019   CO2 22 12/06/2019   INR 1.3 (H) 12/05/2019   HGBA1C 7.2 (H) 12/03/2019     MICROALBUR 18.4 (H) 06/28/2019      PT/INR:  Recent Labs    12/05/19 1500  LABPROT 16.1*  INR 1.3*   Radiology: DG Chest Port 1 View  Result Date: 12/05/2019 CLINICAL DATA:  Status post aortic valve replacement EXAM: PORTABLE CHEST 1 VIEW COMPARISON:  12/03/2019 FINDINGS: Changes consistent with recent aortic valve replacement are noted. Endotracheal tube, gastric catheter, Swan-Ganz catheter, mediastinal drain and right thoracostomy catheter are seen. Pericardial drain is noted as well. No pneumothorax is seen. Mild bibasilar atelectasis is noted. IMPRESSION: Tubes and lines as described above. Mild bibasilar atelectasis. Postsurgical changes. Electronically Signed   By: Inez Catalina M.D.   On: 12/05/2019 15:42   ECHO INTRAOPERATIVE TEE  Result Date: 12/05/2019  *INTRAOPERATIVE TRANSESOPHAGEAL REPORT *  Patient Name:   Adam Long Date of Exam: 12/05/2019 Medical Rec #:  [id number]       Height:       70.0 in Accession #:    [id number]      Weight:       370.0 lb Date of Birth:  1976/04/26       BSA:          2.71 m Patient Age:    44 years        BP:           119/73 mmHg Patient Gender: M               HR:           86 bpm. Exam Location:  Inpatient Transesophogeal exam was perform intraoperatively during surgical procedure. Patient was closely monitored under general anesthesia during the entirety of examination. Indications:     I35.0 Nonrheumatic aortic (valve) stenosis Performing Phys: Horton Markes Shatswell Diagnosing Phys: Duane Boston MD Complications: No known complications during this procedure. POST-OP IMPRESSIONS - Left Ventricle: The left ventricle is unchanged from pre-bypass. - Aorta: The aorta appears unchanged from pre-bypass. - Left Atrial Appendage: The left atrial appendage appears unchanged from pre-bypass. - Aortic Valve: No stenosis present. A bileaflet mechanical valve was placed, leaflets are freely mobile Size; 70mm. There is no regurgitation. No regurgitation post  repair. Normal washing jets for valve type. The gradient recorded across the prosthetic valve is within the expected range, measuring 229 cm/s. No perivalvular leak noted. - Mitral Valve: The mitral valve appears unchanged from pre-bypass. - Tricuspid Valve: The tricuspid valve appears unchanged from pre-bypass. - Interatrial Septum: The interatrial septum appears unchanged from pre-bypass. PRE-OP FINDINGS  Left Ventricle: The left ventricle has normal systolic function, with an ejection fraction of 55-60% 58%.  The cavity size was normal. There is mildly increased left ventricular wall thickness. No evidence of left ventricular regional wall motion abnormalities. Right Ventricle: The right ventricle has normal systolic function. The cavity was mildly enlarged. There is increased right ventricular wall thickness. Left Atrium: Left atrial size was not well visualized. The left atrial appendage is well visualized and there is no evidence of thrombus present. Right Atrium: Right atrial size was normal in size. Right atrial pressure is estimated at 10 mmHg. Interatrial Septum: No atrial level shunt detected by color flow Doppler. The interatrial septum appears to be lipomatous. Pericardium: There is no evidence of pericardial effusion. Mitral Valve: The mitral valve is normal in structure. Mild thickening of the mitral valve leaflet. Moderate calcification of the mitral valve leaflet. Mitral valve regurgitation is trivial by color flow Doppler. The MR jet is centrally-directed. There is no evidence of mitral valve vegetation. Tricuspid Valve: The tricuspid valve was normal in structure. Tricuspid valve regurgitation is trivial by color flow Doppler. The jet is directed centrally. The tricuspid valve is mildly thickened. The tricuspid valve is mildly calcified. There is no evidence of tricuspid valve vegetation. Aortic Valve: The aortic valve is bicuspid There is severe thickening of the aortic valve and There is severe  calcifcation of the aortic valve Aortic valve regurgitation is mild by color flow Doppler. The jet is eccentric anteriorly directed. There is severe stenosis of the aortic valve. There is Severe aortic annular calcification noted. There is no evidence of a vegetation on the aortic valve. Only the left coronary cusp appears to move. Pulmonic Valve: The pulmonic valve was normal in structure. Pulmonic valve regurgitation is trivial by color flow Doppler. +-------------+---------++ AORTIC VALVE           +-------------+---------++ AV Mean Grad:40.0 mmHg +-------------+---------++ +-------------+--------++ MITRAL VALVE          +-------------+--------++ MV Mean grad:4.0 mmHg +-------------+--------++  Heather Roberts MD Electronically signed by Heather Roberts MD Signature Date/Time: 12/05/2019/3:08:30 PM    Final      Assessment/Plan: S/P Procedure(s) (LRB): PARTIAL STERNOTOMY FOR AORTIC VALVE REPLACEMENT (AVR), USING CARBOMEDICS SUPRA-ANNULAR TOP HAT (N/A) TRANSESOPHAGEAL ECHOCARDIOGRAM (TEE) (N/A)  1. NSR rate in the 90s, MAPs in the 70s. Off all drips.  2. CXR reviewed, bilateral atelectasis and possible infiltrate on the right. Chest tubes put out 273ml/since surgery 3. Renal-creatinine 0.70, electrolytes okay. Urine output excellent.  4. H and H 12.0/36.2, expected acute blood loss anemia 5. Endo-blood glucose well controlled on insulin gtt, transition to SSI nad Levemir. 6. Start coumadin 5mg  tonight for mechanical valve. Will follow PT/INR 7. Continue post-op antibiotics.   Plan: See progression orders. Okay to discontinue swan ganz catheter and arterial line. OOB to chair. Keep chest tubes and foley catheter one more day.     12/06/2019 7:24 AM    I have seen and examined the patient and agree with the assessment and plan as outlined.  Doing well POD1  02/05/2020, MD 12/06/2019

## 2019-12-07 ENCOUNTER — Inpatient Hospital Stay (HOSPITAL_COMMUNITY): Payer: Self-pay

## 2019-12-07 LAB — PROTIME-INR
INR: 1.3 — ABNORMAL HIGH (ref 0.8–1.2)
Prothrombin Time: 16 seconds — ABNORMAL HIGH (ref 11.4–15.2)

## 2019-12-07 LAB — CBC
HCT: 33.1 % — ABNORMAL LOW (ref 39.0–52.0)
Hemoglobin: 10.8 g/dL — ABNORMAL LOW (ref 13.0–17.0)
MCH: 29.8 pg (ref 26.0–34.0)
MCHC: 32.6 g/dL (ref 30.0–36.0)
MCV: 91.4 fL (ref 80.0–100.0)
Platelets: 142 10*3/uL — ABNORMAL LOW (ref 150–400)
RBC: 3.62 MIL/uL — ABNORMAL LOW (ref 4.22–5.81)
RDW: 13.4 % (ref 11.5–15.5)
WBC: 13.1 10*3/uL — ABNORMAL HIGH (ref 4.0–10.5)
nRBC: 0 % (ref 0.0–0.2)

## 2019-12-07 LAB — BASIC METABOLIC PANEL
Anion gap: 8 (ref 5–15)
BUN: 11 mg/dL (ref 6–20)
CO2: 25 mmol/L (ref 22–32)
Calcium: 8 mg/dL — ABNORMAL LOW (ref 8.9–10.3)
Chloride: 97 mmol/L — ABNORMAL LOW (ref 98–111)
Creatinine, Ser: 0.61 mg/dL (ref 0.61–1.24)
GFR calc Af Amer: >60 mL/min (ref >60)
GFR calc non Af Amer: >60 mL/min (ref >60)
Glucose, Bld: 151 mg/dL — ABNORMAL HIGH (ref 70–99)
Potassium: 3.9 mmol/L (ref 3.5–5.1)
Sodium: 130 mmol/L — ABNORMAL LOW (ref 135–145)

## 2019-12-07 LAB — GLUCOSE, CAPILLARY
Glucose-Capillary: 139 mg/dL — ABNORMAL HIGH (ref 70–99)
Glucose-Capillary: 159 mg/dL — ABNORMAL HIGH (ref 70–99)
Glucose-Capillary: 145 mg/dL — ABNORMAL HIGH (ref 70–99)
Glucose-Capillary: 119 mg/dL — ABNORMAL HIGH (ref 70–99)
Glucose-Capillary: 129 mg/dL — ABNORMAL HIGH (ref 70–99)

## 2019-12-07 MED ORDER — INSULIN ASPART 100 UNIT/ML ~~LOC~~ SOLN
0.0000 [IU] | Freq: Every day | SUBCUTANEOUS | Status: DC
  Administered 2019-12-08: 3 [IU] via SUBCUTANEOUS

## 2019-12-07 MED ORDER — ~~LOC~~ CARDIAC SURGERY, PATIENT & FAMILY EDUCATION: Freq: Once | Status: DC

## 2019-12-07 MED ORDER — POTASSIUM CHLORIDE CRYS ER 20 MEQ PO TBCR: 20.0000 meq | EXTENDED_RELEASE_TABLET | Freq: Two times a day (BID) | ORAL | Status: DC

## 2019-12-07 MED ORDER — METOPROLOL TARTRATE 12.5 MG HALF TABLET
12.5000 mg | ORAL_TABLET | Freq: Two times a day (BID) | ORAL | Status: DC
  Administered 2019-12-07 – 2019-12-09 (×5): 12.5 mg via ORAL
  Filled 2019-12-07 (×5): qty 1

## 2019-12-07 MED ORDER — ASPIRIN EC 81 MG PO TBEC
81.0000 mg | DELAYED_RELEASE_TABLET | Freq: Every morning | ORAL | Status: DC
  Administered 2019-12-07 – 2019-12-12 (×6): 81 mg via ORAL
  Filled 2019-12-07 (×6): qty 1

## 2019-12-07 MED ORDER — FUROSEMIDE 40 MG PO TABS: 40.0000 mg | ORAL_TABLET | Freq: Two times a day (BID) | ORAL | Status: DC

## 2019-12-07 MED ORDER — INSULIN ASPART 100 UNIT/ML ~~LOC~~ SOLN
0.0000 [IU] | Freq: Three times a day (TID) | SUBCUTANEOUS | Status: DC
  Administered 2019-12-07 – 2019-12-09 (×4): 3 [IU] via SUBCUTANEOUS
  Administered 2019-12-09: 13:00:00 4 [IU] via SUBCUTANEOUS
  Administered 2019-12-10 – 2019-12-12 (×3): 3 [IU] via SUBCUTANEOUS

## 2019-12-07 MED FILL — Sodium Bicarbonate IV Soln 8.4%: INTRAVENOUS | Qty: 50 | Status: AC

## 2019-12-07 MED FILL — Lidocaine HCl Local Preservative Free (PF) Inj 2%: INTRAMUSCULAR | Qty: 15 | Status: AC

## 2019-12-07 MED FILL — Mannitol IV Soln 20%: INTRAVENOUS | Qty: 500 | Status: AC

## 2019-12-07 MED FILL — Sodium Chloride IV Soln 0.9%: INTRAVENOUS | Qty: 3000 | Status: AC

## 2019-12-07 MED FILL — Potassium Chloride Inj 2 mEq/ML: INTRAVENOUS | Qty: 40 | Status: AC

## 2019-12-07 MED FILL — Electrolyte-R (PH 7.4) Solution: INTRAVENOUS | Qty: 5000 | Status: AC

## 2019-12-07 MED FILL — Heparin Sodium (Porcine) Inj 1000 Unit/ML: INTRAMUSCULAR | Qty: 30 | Status: AC

## 2019-12-07 MED FILL — Potassium Chloride Inj 2 mEq/ML: INTRAVENOUS | Qty: 20 | Status: AC

## 2019-12-07 NOTE — Discharge Summary (Signed)
Physician Discharge Summary       [address] [place].[address]       [name] [address]             [phone]       Patient ID: Adam Long MRN: [id number] DOB/AGE: 44-Nov-1977 44 y.o.  Admit date: 12/05/2019 Discharge date: 12/12/2019  Admission Diagnoses:  Patient Active Problem List   Diagnosis Date Noted  . Elevated troponin level not due myocardial infarction 11/11/2019  . Syncope 11/10/2019  . Severe aortic stenosis 09/24/2019  . Gangrene of toe of left foot (HCC) 07/06/2019  . Cellulitis of fourth toe of left foot   . Cellulitis and abscess of lower extremity 06/11/2019  . Personal history of noncompliance with medical treatment, presenting hazards to health 03/16/2018  . Uncontrolled type 2 diabetes mellitus with complication (HCC) 10/12/2015  . Mixed hyperlipidemia 10/12/2015  . Morbid obesity (HCC) 10/12/2015  . Essential hypertension, benign 10/12/2015  . Chest pain 07/02/2013  . HTN (hypertension) 07/02/2013  . DM (diabetes mellitus) (HCC) 07/02/2013    Discharge Diagnoses:  Principal Problem:   S/P aortic valve replacement with mechanical valve Active Problems:   HTN (hypertension)   DM (diabetes mellitus) (HCC)   Uncontrolled type 2 diabetes mellitus with complication (HCC)   Morbid obesity (HCC)   Essential hypertension, benign   Severe aortic stenosis   S/P AVR (aortic valve replacement)   Discharged Condition: stable  HPI:   Patient is a 44 year old morbidly obese male with history of likely bicuspid aortic valve with aortic stenosis,hypertension, type 2 diabetes mellitus, anxiety and depression who has been referred for surgical consultation to discuss treatment options for management of severe symptomatic aortic stenosis.  Patient states that he was first told that he had a heart murmur when he was hospitalized in September 2020 for cellulitis involving his left foot. Transthoracic echocardiogram performed at that time revealed likely  bicuspid aortic valve with severe aortic stenosis. Peak velocity across aortic valve measured 4.5 m/s corresponding to mean transvalvular gradient estimated 49.5 mmHg.No aortic insufficiency was noted and the DVI was reported 0.24. Left ventricular systolic function appeared normal with ejection fraction estimated 55 to 60%. 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 Once he completely recovered he was referred to the multidisciplinary heart valve clinic where he has been seen previously by Dr. Excell Seltzer. Catheterization revealed mild nonobstructive coronary artery disease with exception of moderate stenosis of the mid left anterior descending coronary artery. There was severe pulmonary hypertension. CT angiography has been performed and the patient was referred for surgical consultation.  Patient is single and lives alone near [facility]. 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 He states that he is limited physically by significant exertional shortness of breath. He gets short of breath with moderate level activity. He denies resting shortness of breath, PND, palpitations, dizzy spells, or syncope. He has notexperienced exertional chest discomfort. He states that he cannot lay flat in bed and he suspects that he may have sleep apnea.  Patient is a 44 year old morbidly obese malewith history of likely bicuspid aortic valve with aortic stenosis,hypertension, type 2 diabetes mellitus, anxiety and depressionwho returns the office today with plans to proceed with aortic valve replacement later this week for severe symptomatic aortic stenosis. He was last seen here in our office on November 05, 2019. On November 10, 2019 the patient suffered  a syncopal episode at work which occurred in the setting of prolonged strenuous exertion. The patient was  observed overnight in the hospital but I was not personally contacted and our service was not consulted for follow-up. Patient returns her office today and reports that otherwise he has done fine. Since that event he has not been exerting himself at all. He denies any fevers, chills, or productive cough. He has not had any chest pains. He has not had any further dizzy spells.    Hospital Course:  The patient underwent Aortic valve replacement ( mechanical) on 12/05/2019 with Dr. Cornelius Moras. He tolerated the procedure well and was transferred to the surgical ICU in stable condition. POD 1 he was overall doing well and off all drips. His renal function remained stable. We started coumadin for his mechanical valve. We discontinued his arterial line and swan ganz catheter. We encouraged ambulation. POD 2 we discontinued his chest tubes and continued diuresis. He was stable to transfer to the stepdown unit. POD 3 he was doing well on the floor. We discontinued his EPW since he was maintaining NSR. We converted him to oral Lasix. Increased coumadin to  7.5mg  for his mechanical valve. We continued Lovenox bridge until he was therapeutic on his coumadin. We increased his metoprolol to 25mg  BID and resumed his Lisinopril. POD 5 he was feeling well and ambulating with limited assistance.  Today the patient is stable to discharge to home. He will be discharged on 10mg  of coumadin and will plan to have his INR drawn Thursday 3/18. His chest tube sutures will be removed before discharge.    Consults: None  Significant Diagnostic Studies:   CLINICAL DATA:  Status post AVR  EXAM: PORTABLE CHEST 1 VIEW  COMPARISON:  12/06/2019  FINDINGS: The Swan-Ganz catheter has been removed. The right IJ Cordis is still in place. The right-sided chest tube and mediastinal drain tubes are stable. No pneumothorax.  Mild cardiac enlargement is stable.  Improved lung aeration with resolving edema and atelectasis.  No definite pleural effusions.  IMPRESSION: Improved lung aeration with resolving edema and atelectasis.   Electronically Signed   By: 10-31-1976 M.D.   On: 12/07/2019 08:57    Treatments:  CARDIOTHORACIC SURGERY OPERATIVE NOTE  Date of Procedure:                12/05/2019  Preoperative Diagnosis:        Bicuspid Aortic Valve  Severe Aortic Stenosis   Moderate Aortic Insufficiency  Postoperative Diagnosis:    Same   Procedure:        Aortic Valve Replacement             Partial upper hemi-sternotomy             Carbomedics Top Hat bileaflet mechanical valve (size 25 mm, cat # 02/06/2020, serial # 02/04/2020)              Surgeon:        [id number]. [id number], MD  Assistant:       Salvatore Decent, PA-C  Anesthesia:    Cornelius Moras, MD  Operative Findings: ? Sievers type 0 bicuspid aortic valve ? Severe aortic stenosis ? Normal left ventricular systolic function ? Moderate-severe pulmonary hypertension  Discharge Exam: Blood pressure 122/81, pulse 66, temperature 98 F (36.7 C), temperature source Oral, resp. rate 16, height 5\' 10"  (1.778 m), weight (!) 168.2 kg, SpO2 98 %.   General appearance: alert, cooperative and no distress Heart: regular rate  and rhythm, S1, S2 normal, no murmur, click, rub or gallop Lungs: clear to auscultation bilaterally Abdomen: soft, non-tender; bowel sounds normal; no masses,  no organomegaly Extremities: extremities normal, atraumatic, no cyanosis or edema Wound: clean and dry  Disposition: Discharge disposition: 01-Home or Self Care       Discharge Instructions    Amb Referral to Cardiac Rehabilitation   Complete by: As directed    Diagnosis: Valve Replacement   Valve: Aortic   After initial evaluation and assessments completed: Virtual Based Care may be provided alone or in conjunction with Phase 2 Cardiac Rehab based on patient barriers.: Yes     Allergies as of 12/12/2019      Reactions   Pollen Extract Other  (See Comments)   Runny nose, watery eyes, sneezing      Medication List    STOP taking these medications   oxyCODONE-acetaminophen 5-325 MG tablet Commonly known as: Percocet     TAKE these medications   aspirin EC 81 MG tablet Take 81 mg by mouth every morning.   atorvastatin 40 MG tablet Commonly known as: LIPITOR Take 1 tablet (40 mg total) by mouth daily. What changed: when to take this   furosemide 40 MG tablet Commonly known as: LASIX Take 1 tablet (40 mg total) by mouth daily for 5 days.   Lantus SoloStar 100 UNIT/ML Solostar Pen Generic drug: insulin glargine Inject 40 Units into the skin at bedtime.   lisinopril 20 MG tablet Commonly known as: ZESTRIL Take 1 tablet (20 mg total) by mouth daily. What changed: when to take this   metoprolol tartrate 25 MG tablet Commonly known as: LOPRESSOR Take 1 tablet (25 mg total) by mouth 2 (two) times daily. What changed:   medication strength  how much to take   oxyCODONE 5 MG immediate release tablet Commonly known as: Oxy IR/ROXICODONE Take 1 tablet (5 mg total) by mouth every 6 (six) hours as needed for severe pain.   potassium chloride SA 20 MEQ tablet Commonly known as: KLOR-CON Take 1 tablet (20 mEq total) by mouth daily for 5 doses.   sitaGLIPtin-metformin 50-500 MG tablet Commonly known as: JANUMET Take 1 tablet by mouth 2 (two) times daily with a meal.   warfarin 10 MG tablet Commonly known as: COUMADIN Take 1 tablet (10 mg total) by mouth daily at 6 PM.      Follow-up Information    Jacquelin Hawking, PA-C. Call in 1 day(s).   Specialty: Physician Assistant Contact information: [address] [place] [address] [phone]        Laqueta Linden, MD .   Specialty: Cardiology Contact information: [phone] [place] [address] [phone]        [facility] [place] Follow up.   Specialty: Cardiothoracic Surgery Why: Your routine  follow-up appointment is on 4/6 @1pm  please arrive at [facility] at 12:30p for a chest xray located at [facility] which is on the first floor of our building.  Contact information: [address] [place], [address] [place] [phone]       [facility] [address] Office Follow up.   Specialty: Cardiology Why: Your INR draw is on 3/18 at 10:00am Contact information: [address], [address] [place] [phone]       [phone], PA-C Follow up.   Specialties: Physician Assistant, Cardiology Why: Ellsworth Lennox, PA-C 4/2 @2 :30 pm ([facility])  Contact information:  [address] [place] [address] [phone]          The patient has been discharged on:   1.Beta Blocker:  Yes [ yes  ]                              No   [   ]                              If No, reason:  2.Ace Inhibitor/ARB: Yes [ yes  ]                                     No  [    ]                                     If No, reason:  3.Statin:   Yes [ yes  ]                  No  [   ]                  If No, reason:  4.Ecasa:  Yes  [ yes  ]                  No   [   ]                  If No, reason:    Signed: Elgie Collard 12/12/2019, 9:32 AM

## 2019-12-07 NOTE — Progress Notes (Signed)
Pt arrived to unit from 2 Heart. Oriented to unit, vss, CHG wipe down, CCMD called and verified. Called Dietary to have tray transferred to this unit. Pt voided in urinal. Pt resting with call bell within reach.  Will continue to monitor.

## 2019-12-07 NOTE — Discharge Instructions (Addendum)
Information on my medicine - Coumadin   (Warfarin)  Why was Coumadin prescribed for you? Coumadin was prescribed for you because you have a blood clot or a medical condition that can cause an increased risk of forming blood clots. Blood clots can cause serious health problems by blocking the flow of blood to the heart, lung, or brain. Coumadin can prevent harmful blood clots from forming. As a reminder your indication for Coumadin is:   Blood Clot Prevention After Heart Valve Surgery  What test will check on my response to Coumadin? While on Coumadin (warfarin) you will need to have an INR test regularly to ensure that your dose is keeping you in the desired range. The INR (international normalized ratio) number is calculated from the result of the laboratory test called prothrombin time (PT).  If an INR APPOINTMENT HAS NOT ALREADY BEEN MADE FOR YOU please schedule an appointment to have this lab work done by your health care provider within 7 days. Your INR goal is usually a number between:  2 to 3 or your provider may give you a more narrow range like 2-2.5.  Ask your health care provider during an office visit what your goal INR is.  What  do you need to  know  About  COUMADIN? Take Coumadin (warfarin) exactly as prescribed by your healthcare provider about the same time each day.  DO NOT stop taking without talking to the doctor who prescribed the medication.  Stopping without other blood clot prevention medication to take the place of Coumadin may increase your risk of developing a new clot or stroke.  Get refills before you run out.  What do you do if you miss a dose? If you miss a dose, take it as soon as you remember on the same day then continue your regularly scheduled regimen the next day.  Do not take two doses of Coumadin at the same time.  Important Safety Information A possible side effect of Coumadin (Warfarin) is an increased risk of bleeding. You should call your healthcare  provider right away if you experience any of the following: ? Bleeding from an injury or your nose that does not stop. ? Unusual colored urine (red or dark brown) or unusual colored stools (red or black). ? Unusual bruising for unknown reasons. ? A serious fall or if you hit your head (even if there is no bleeding).  Some foods or medicines interact with Coumadin (warfarin) and might alter your response to warfarin. To help avoid this: ? Eat a balanced diet, maintaining a consistent amount of Vitamin K. ? Notify your provider about major diet changes you plan to make. ? Avoid alcohol or limit your intake to 1 drink for women and 2 drinks for men per day. (1 drink is 5 oz. wine, 12 oz. beer, or 1.5 oz. liquor.)  Make sure that ANY health care provider who prescribes medication for you knows that you are taking Coumadin (warfarin).  Also make sure the healthcare provider who is monitoring your Coumadin knows when you have started a new medication including herbals and non-prescription products.  Coumadin (Warfarin)  Major Drug Interactions  Increased Warfarin Effect Decreased Warfarin Effect  Alcohol (large quantities) Antibiotics (esp. Septra/Bactrim, Flagyl, Cipro) Amiodarone (Cordarone) Aspirin (ASA) Cimetidine (Tagamet) Megestrol (Megace) NSAIDs (ibuprofen, naproxen, etc.) Piroxicam (Feldene) Propafenone (Rythmol SR) Propranolol (Inderal) Isoniazid (INH) Posaconazole (Noxafil) Barbiturates (Phenobarbital) Carbamazepine (Tegretol) Chlordiazepoxide (Librium) Cholestyramine (Questran) Griseofulvin Oral Contraceptives Rifampin Sucralfate (Carafate) Vitamin K   Coumadin (Warfarin) Major  Herbal Interactions  Increased Warfarin Effect Decreased Warfarin Effect  Garlic Ginseng Ginkgo biloba Coenzyme Q10 Green tea St. John's wort    Coumadin (Warfarin) FOOD Interactions  Eat a consistent number of servings per week of foods HIGH in Vitamin K (1 serving =  cup)  Collards  (cooked, or boiled & drained) Kale (cooked, or boiled & drained) Mustard greens (cooked, or boiled & drained) Parsley *serving size only =  cup Spinach (cooked, or boiled & drained) Swiss chard (cooked, or boiled & drained) Turnip greens (cooked, or boiled & drained)  Eat a consistent number of servings per week of foods MEDIUM-HIGH in Vitamin K (1 serving = 1 cup)  Asparagus (cooked, or boiled & drained) Broccoli (cooked, boiled & drained, or raw & chopped) Brussel sprouts (cooked, or boiled & drained) *serving size only =  cup Lettuce, raw (green leaf, endive, romaine) Spinach, raw Turnip greens, raw & chopped   These websites have more information on Coumadin (warfarin):  http://www.king-russell.com/; https://www.hines.net/;   Discharge Instructions:  1. You may shower, please wash incisions daily with soap and water and keep dry.  If you wish to cover wounds with dressing you may do so but please keep clean and change daily.  No tub baths or swimming until incisions have completely healed.  If your incisions become red or develop any drainage please call our office at [phone]  2. No Driving until cleared by our office and you are no longer using narcotic pain medications  3. Monitor your weight daily.. Please use the same scale and weigh at same time... If you gain 3-5 lbs in 48 hours with associated lower extremity swelling, please contact our office at [phone]  4. Fever of 101.5 for at least 24 hours, please contact our office at [phone]  5. Activity- up as tolerated, please walk at least 3 times per day.  Avoid strenuous activity, no lifting, pushing, or pulling with your arms over 8-10 lbs for a minimum of 6 weeks  6. If any questions or concerns arise, please do not hesitate to contact our office at [phone]  7. Paint incision with betadine sticks for one week after showering.

## 2019-12-07 NOTE — Progress Notes (Addendum)
[facility] DAILY ICU PROGRESS NOTE                   [address]            [name] [address]          [phone]   2 Days Post-Op Procedure(s) (LRB): PARTIAL STERNOTOMY FOR AORTIC VALVE REPLACEMENT (AVR), USING CARBOMEDICS SUPRA-ANNULAR TOP HAT (N/A) TRANSESOPHAGEAL ECHOCARDIOGRAM (TEE) (N/A)  Total Length of Stay:  LOS: 2 days   Subjective: Feels okay this morning. He states it is easier to take a deep breath.   Objective: Vital signs in last 24 hours: Temp:  [97.7 F (36.5 C)-98.2 F (36.8 C)] 97.7 F (36.5 C) (03/12 0350) Pulse Rate:  [86-105] 86 (03/12 0500) Cardiac Rhythm: Sinus tachycardia (03/12 0000) Resp:  [0-20] 14 (03/12 0500) BP: (100-121)/(54-73) 113/61 (03/12 0500) SpO2:  [92 %-99 %] 98 % (03/12 0500) Weight:  [179.4 kg] 179.4 kg (03/12 0500)  Filed Weights   12/05/19 0709 12/06/19 0500 12/07/19 0500  Weight: (!) 167.8 kg (!) 177.9 kg (!) 179.4 kg    Weight change: 6.126 kg   Hemodynamic parameters for last 24 hours: PAP: (37)/(25) 37/25  Intake/Output from previous day: 03/11 0701 - 03/12 0700 In: 164.7 [I.V.:64.7; IV Piggyback:100] Out: 1985 [Urine:1645; Chest Tube:340]  Intake/Output this shift: No intake/output data recorded.  Current Meds: Scheduled Meds: . acetaminophen  1,000 mg Oral Q6H  . aspirin EC  81 mg Oral q morning - 10a  . [START ON 12/08/2019] atorvastatin  40 mg Oral Daily  . bisacodyl  10 mg Oral Daily   Or  . bisacodyl  10 mg Rectal Daily  . Chlorhexidine Gluconate Cloth  6 each Topical Daily  . Young Cardiac Surgery, Patient & Family Education   Does not apply Once  . docusate sodium  200 mg Oral Daily  . enoxaparin (LOVENOX) injection  40 mg Subcutaneous QHS  . furosemide  40 mg Intravenous BID  . insulin aspart  0-24 Units Subcutaneous Q4H  . insulin detemir  20 Units Subcutaneous BID  . ketorolac  15 mg Intravenous Q6H  . mouth rinse  15 mL Mouth Rinse BID  . pantoprazole  40 mg Oral Daily    . sodium chloride flush  10-40 mL Intracatheter Q12H  . sodium chloride flush  3 mL Intravenous Q12H  . warfarin  5 mg Oral q1800  . Warfarin - Physician Dosing Inpatient   Does not apply q1800   Continuous Infusions: . sodium chloride    . lactated ringers    . lactated ringers Stopped (12/06/19 0917)   PRN Meds:.dextrose, metoprolol tartrate, morphine injection, ondansetron (ZOFRAN) IV, oxyCODONE, sodium chloride flush, sodium chloride flush, traMADol  General appearance: alert, cooperative and no distress Heart: regular rate and rhythm, S1, S2 normal, no murmur, click, rub or gallop Lungs: clear to auscultation bilaterally Abdomen: soft, non-tender; bowel sounds normal; no masses,  no organomegaly Extremities: 1+ non pitting edema Wound: clean and dry covered with a sterile dressing  Lab Results: CBC: Recent Labs    12/06/19 1633 12/07/19 0536  WBC 19.0* 13.1*  HGB 11.6* 10.8*  HCT 34.4* 33.1*  PLT 166 142*   BMET:  Recent Labs    12/06/19 1633 12/07/19 0536  NA 134* 130*  K 3.9 3.9  CL 100 97*  CO2 24 25  GLUCOSE 158* 151*  BUN 11 11  CREATININE 0.75 0.61  CALCIUM 7.9* 8.0*    CMET:  Lab Results  Component Value Date   WBC 13.1 (H) 12/07/2019   HGB 10.8 (L) 12/07/2019   HCT 33.1 (L) 12/07/2019   PLT 142 (L) 12/07/2019   GLUCOSE 151 (H) 12/07/2019   CHOL 160 10/22/2019   TRIG 119 10/22/2019   HDL 38 (L) 10/22/2019   LDLCALC 98 10/22/2019   ALT 27 12/03/2019   AST 23 12/03/2019   NA 130 (L) 12/07/2019   K 3.9 12/07/2019   CL 97 (L) 12/07/2019   CREATININE 0.61 12/07/2019   BUN 11 12/07/2019   CO2 25 12/07/2019   INR 1.3 (H) 12/07/2019   HGBA1C 7.2 (H) 12/03/2019   MICROALBUR 18.4 (H) 06/28/2019      PT/INR:  Recent Labs    12/07/19 0536  LABPROT 16.0*  INR 1.3*   Radiology: No results found.   Assessment/Plan: S/P Procedure(s) (LRB): PARTIAL STERNOTOMY FOR AORTIC VALVE REPLACEMENT (AVR), USING CARBOMEDICS SUPRA-ANNULAR TOP HAT 25MM  (N/A) TRANSESOPHAGEAL ECHOCARDIOGRAM (TEE) (N/A)  1. NSR rate in the 90s, MAPs in the 80s. Off all drips.  2. CXR reviewed, Improved aeration. Chest tube removal today. 3. Renal-creatinine 0.61, electrolytes okay. Urine output excellent.  4. H and H 10.8/33.1, expected acute blood loss anemia 5. Endo-blood glucose well controlled on SSI and Levemir. 6. On coumadin 5mg  for mechanical valve. INR 1.3  Plan: Discontinue chest tubes. OOB to chair. Continue to wean oxygen as tolerated. Ambulate in the halls today. Transfer to the stepdown unit. See transfer orders.      Elgie Collard 12/07/2019 7:16 AM   I have seen and examined the patient and agree with the assessment and plan as outlined.  D/C tubes and Foley.  Mobilize.  Restart beta blocker at reduced dose.  Continue diuresis.  Consider restarting ACE-I in 1-2 days if BP will allow.  Convert CBG's and SSI to ac/hs.  Coumadin.  Transfer 4E  Rexene Alberts, MD 12/07/2019 9:05 AM

## 2019-12-07 NOTE — Progress Notes (Signed)
CARDIAC REHAB PHASE I   PRE:  Rate/Rhythm: 90 SR  BP:  Supine:   Sitting: 132/85  Standing:    SaO2: 100%RA  MODE:  Ambulation: 100 ft   POST:  Rate/Rhythm: 116 ST  BP:  Supine:   Sitting: 145/69  Standing:    SaO2: 100%RA [id number] Assisted pt with getting cleaned up as urinal spilled in bed. Pt walked 100 ft on RA with EVA and asst x2. Stopped several times to rest. C/o legs weak. To recliner after walk. Pt able to demonstrate 1000 ml on IS. Encouraged another walk today as this was his second walk today. Pt stated he may need Rehab as he has no one with him at home during day. Will need to see case manager.   Luetta Nutting, RN BSN  12/07/2019 11:06 AM

## 2019-12-07 NOTE — Plan of Care (Signed)
  Problem: Clinical Measurements: Goal: Ability to maintain clinical measurements within normal limits will improve Outcome: Progressing Goal: Respiratory complications will improve Outcome: Progressing Goal: Cardiovascular complication will be avoided Outcome: Progressing   Problem: Activity: Goal: Risk for activity intolerance will decrease Outcome: Progressing   Problem: Coping: Goal: Level of anxiety will decrease Outcome: Progressing   Problem: Elimination: Goal: Will not experience complications related to urinary retention Outcome: Progressing   Problem: Pain Managment: Goal: General experience of comfort will improve Outcome: Progressing   Problem: Safety: Goal: Ability to remain free from injury will improve Outcome: Progressing   Problem: Skin Integrity: Goal: Risk for impaired skin integrity will decrease Outcome: Progressing   

## 2019-12-08 ENCOUNTER — Inpatient Hospital Stay (HOSPITAL_COMMUNITY): Payer: Self-pay

## 2019-12-08 LAB — CBC
HCT: 35.8 % — ABNORMAL LOW (ref 39.0–52.0)
Hemoglobin: 11.9 g/dL — ABNORMAL LOW (ref 13.0–17.0)
MCH: 29.6 pg (ref 26.0–34.0)
MCHC: 33.2 g/dL (ref 30.0–36.0)
MCV: 89.1 fL (ref 80.0–100.0)
Platelets: 188 10*3/uL (ref 150–400)
RBC: 4.02 MIL/uL — ABNORMAL LOW (ref 4.22–5.81)
RDW: 13.2 % (ref 11.5–15.5)
WBC: 12.8 10*3/uL — ABNORMAL HIGH (ref 4.0–10.5)
nRBC: 0 % (ref 0.0–0.2)

## 2019-12-08 LAB — BASIC METABOLIC PANEL
Anion gap: 11 (ref 5–15)
BUN: 12 mg/dL (ref 6–20)
CO2: 28 mmol/L (ref 22–32)
Calcium: 8.4 mg/dL — ABNORMAL LOW (ref 8.9–10.3)
Chloride: 95 mmol/L — ABNORMAL LOW (ref 98–111)
Creatinine, Ser: 0.78 mg/dL (ref 0.61–1.24)
GFR calc Af Amer: >60 mL/min (ref >60)
GFR calc non Af Amer: >60 mL/min (ref >60)
Glucose, Bld: 154 mg/dL — ABNORMAL HIGH (ref 70–99)
Potassium: 3.5 mmol/L (ref 3.5–5.1)
Sodium: 134 mmol/L — ABNORMAL LOW (ref 135–145)

## 2019-12-08 LAB — PROTIME-INR
INR: 1.1 (ref 0.8–1.2)
Prothrombin Time: 14.2 seconds (ref 11.4–15.2)

## 2019-12-08 LAB — GLUCOSE, CAPILLARY
Glucose-Capillary: 130 mg/dL — ABNORMAL HIGH (ref 70–99)
Glucose-Capillary: 121 mg/dL — ABNORMAL HIGH (ref 70–99)
Glucose-Capillary: 149 mg/dL — ABNORMAL HIGH (ref 70–99)
Glucose-Capillary: 130 mg/dL — ABNORMAL HIGH (ref 70–99)

## 2019-12-08 MED ORDER — FUROSEMIDE 40 MG PO TABS
40.0000 mg | ORAL_TABLET | Freq: Two times a day (BID) | ORAL | Status: DC
  Administered 2019-12-08 – 2019-12-12 (×9): 40 mg via ORAL
  Filled 2019-12-08 (×9): qty 1

## 2019-12-08 MED ORDER — POTASSIUM CHLORIDE CRYS ER 20 MEQ PO TBCR
20.0000 meq | EXTENDED_RELEASE_TABLET | Freq: Three times a day (TID) | ORAL | Status: DC
  Administered 2019-12-08 – 2019-12-12 (×13): 20 meq via ORAL
  Filled 2019-12-08 (×13): qty 1

## 2019-12-08 NOTE — Plan of Care (Signed)
  Problem: Clinical Measurements: Goal: Ability to maintain clinical measurements within normal limits will improve Outcome: Progressing Goal: Will remain free from infection Outcome: Progressing Goal: Diagnostic test results will improve Outcome: Progressing Goal: Respiratory complications will improve Outcome: Progressing Goal: Cardiovascular complication will be avoided Outcome: Progressing   Problem: Activity: Goal: Risk for activity intolerance will decrease Outcome: Progressing   Problem: Coping: Goal: Level of anxiety will decrease Outcome: Progressing   Problem: Elimination: Goal: Will not experience complications related to bowel motility Outcome: Progressing Goal: Will not experience complications related to urinary retention Outcome: Progressing   Problem: Safety: Goal: Ability to remain free from injury will improve Outcome: Progressing   Problem: Skin Integrity: Goal: Risk for impaired skin integrity will decrease Outcome: Progressing   Problem: Skin Integrity: Goal: Risk for impaired skin integrity will decrease Outcome: Progressing

## 2019-12-08 NOTE — Progress Notes (Signed)
3 Days Post-Op Procedure(s) (LRB): PARTIAL STERNOTOMY FOR AORTIC VALVE REPLACEMENT (AVR), USING CARBOMEDICS SUPRA-ANNULAR TOP HAT (N/A) TRANSESOPHAGEAL ECHOCARDIOGRAM (TEE) (N/A) Subjective: Up in the bedside chair. Says he feels good, no problems or concerns.  Doing well with mobility, BM x 2 yesterday.   Objective: Vital signs in last 24 hours: Temp:  [97.9 F (36.6 C)-98.6 F (37 C)] 97.9 F (36.6 C) (03/13 0754) Pulse Rate:  [80-100] 83 (03/13 0754) Cardiac Rhythm: Normal sinus rhythm (03/13 0704) Resp:  [12-29] 20 (03/13 0754) BP: (106-150)/(60-109) 116/60 (03/13 0754) SpO2:  [97 %-100 %] 100 % (03/13 0754) Weight:  [172.1 kg] 172.1 kg (03/13 0402)   Intake/Output from previous day: 03/12 0701 - 03/13 0700 In: 360 [P.O.:360] Out: 5225 [Urine:5225] Intake/Output this shift: No intake/output data recorded.  General appearance: alert, cooperative and no distress Neurologic: intact Heart: regular rate and rhythm Lungs: clear to auscultation bilaterally Abdomen: Soft and non-tender Wound: The sternal incision is covered with a dry Aquacell dressing.   Lab Results: Recent Labs    12/07/19 0536 12/08/19 0415  WBC 13.1* 12.8*  HGB 10.8* 11.9*  HCT 33.1* 35.8*  PLT 142* 188   BMET:  Recent Labs    12/07/19 0536 12/08/19 0415  NA 130* 134*  K 3.9 3.5  CL 97* 95*  CO2 25 28  GLUCOSE 151* 154*  BUN 11 12  CREATININE 0.61 0.78  CALCIUM 8.0* 8.4*    PT/INR:  Recent Labs    12/08/19 0415  LABPROT 14.2  INR 1.1   ABG    Component Value Date/Time   PHART 7.354 12/05/2019 1940   HCO3 25.4 12/05/2019 1940   TCO2 27 12/05/2019 1940   ACIDBASEDEF 1.0 12/05/2019 1940   O2SAT 94.0 12/05/2019 1940   CBG (last 3)  Recent Labs    12/07/19 1611 12/07/19 2149 12/08/19 0641  GLUCAP 119* 129* 130*   CHEST - 2 VIEW  COMPARISON:  12/07/2019  FINDINGS: Mild left basilar scarring/atelectasis. Right lung is clear. No pleural effusion or  pneumothorax.  The heart is normal in size.  Prosthetic aortic valve.  IMPRESSION: No evidence of acute cardiopulmonary disease.  Mild left basilar scarring/atelectasis.   Electronically Signed   By: Charline Bills M.D.   On: 12/08/2019 07:32   Assessment/Plan: S/P Procedure(s) (LRB): PARTIAL STERNOTOMY FOR AORTIC VALVE REPLACEMENT (AVR), USING CARBOMEDICS SUPRA-ANNULAR TOP HAT (N/A) TRANSESOPHAGEAL ECHOCARDIOGRAM (TEE) (N/A)  -POD3 AVR for severe AS and moderated AI related to bicuspid aortic valve. Making excellent progress overall.  INR is 1.1 after coumadin 5mg  x 1 dose. Will repeat 5mg  dosing this PM. D/c pacer wire since rhythm has been stable.   -Volume excess- Improved. Had net 5L diuresis yesterday, now 1Kg below pre-op weight. . Will convert to oral Lasix today.  -K+ 3.5, will increase K-Dur to tid today. Monitor.   -DVT PPX- continue Lovenox until INR approaching therapeutic.    -History of HTN- BP acceptable on low-dose metoprolol for now. Will monitor and titrate as required.   -DM-On Janumet and Lantus at home. Good control with Levemir 20U daily and SSI.     LOS: 3 days    , PA-C [phone] 12/08/2019

## 2019-12-08 NOTE — Progress Notes (Signed)
CARDIAC REHAB PHASE I   PRE:  Rate/Rhythm: 83 NSR    BP: sitting 130/80    SaO2: 100 RA  MODE:  Ambulation: 150 ft   POST:  Rate/Rhythm: 109 ST    BP: sitting 130/76     SaO2: 100 RA  [id number] Patient ambulated in hallway x 1 assist with RW. Second CR staff present for PRN support. Slow steady gait noted. Standing restbreak x 2 for "just tired". Patient able to ambulate and converse without shortness of breath. Post ambulation patient back to chair with call bell, computer, and phone at side. Family present. Patient encouraged to ambulate again later today. Will follow up with patient on Monday.    Mackensey Bolte Hoover Brunette RN, BSN

## 2019-12-09 LAB — BASIC METABOLIC PANEL
Anion gap: 12 (ref 5–15)
BUN: 9 mg/dL (ref 6–20)
CO2: 26 mmol/L (ref 22–32)
Calcium: 8.6 mg/dL — ABNORMAL LOW (ref 8.9–10.3)
Chloride: 98 mmol/L (ref 98–111)
Creatinine, Ser: 0.78 mg/dL (ref 0.61–1.24)
GFR calc Af Amer: >60 mL/min (ref >60)
GFR calc non Af Amer: >60 mL/min (ref >60)
Glucose, Bld: 141 mg/dL — ABNORMAL HIGH (ref 70–99)
Potassium: 3.9 mmol/L (ref 3.5–5.1)
Sodium: 136 mmol/L (ref 135–145)

## 2019-12-09 LAB — GLUCOSE, CAPILLARY
Glucose-Capillary: 118 mg/dL — ABNORMAL HIGH (ref 70–99)
Glucose-Capillary: 172 mg/dL — ABNORMAL HIGH (ref 70–99)
Glucose-Capillary: 143 mg/dL — ABNORMAL HIGH (ref 70–99)
Glucose-Capillary: 107 mg/dL — ABNORMAL HIGH (ref 70–99)

## 2019-12-09 LAB — PROTIME-INR
INR: 1.1 (ref 0.8–1.2)
Prothrombin Time: 14 seconds (ref 11.4–15.2)

## 2019-12-09 MED ORDER — WARFARIN SODIUM 7.5 MG PO TABS
7.5000 mg | ORAL_TABLET | Freq: Every day | ORAL | Status: DC
  Administered 2019-12-09 – 2019-12-10 (×2): 7.5 mg via ORAL
  Filled 2019-12-09 (×2): qty 1

## 2019-12-09 MED ORDER — METOPROLOL TARTRATE 25 MG PO TABS
25.0000 mg | ORAL_TABLET | Freq: Two times a day (BID) | ORAL | Status: DC
  Administered 2019-12-09 – 2019-12-12 (×6): 25 mg via ORAL
  Filled 2019-12-09 (×6): qty 1

## 2019-12-09 MED ORDER — LISINOPRIL 10 MG PO TABS
10.0000 mg | ORAL_TABLET | Freq: Every day | ORAL | Status: DC
  Administered 2019-12-09 – 2019-12-10 (×2): 10 mg via ORAL
  Filled 2019-12-09 (×2): qty 1

## 2019-12-09 NOTE — Progress Notes (Signed)
Epicardial wires removed without difficulty.  Wires intact.   Patient tolerated well with no discomfort.   Vital signs taken prior to pulling and cycling Q15 mins for 1 hour.   Patient aware of 1 hour rest period.

## 2019-12-09 NOTE — Progress Notes (Signed)
4 Days Post-Op Procedure(s) (LRB): PARTIAL STERNOTOMY FOR AORTIC VALVE REPLACEMENT (AVR), USING CARBOMEDICS SUPRA-ANNULAR TOP HAT (N/A) TRANSESOPHAGEAL ECHOCARDIOGRAM (TEE) (N/A) Subjective: Feels good, no problems or concerns. Doing well with ambulation. Appetite good.  O2 sats acceptable on RA.   Objective: Vital signs in last 24 hours: Temp:  [97.6 F (36.4 C)-98.6 F (37 C)] 98.6 F (37 C) (03/14 0824) Pulse Rate:  [68-83] 68 (03/14 0824) Cardiac Rhythm: Normal sinus rhythm (03/14 0705) Resp:  [12-20] 20 (03/14 0824) BP: (133-150)/(72-88) 142/88 (03/14 0824) SpO2:  [100 %] 100 % (03/14 0824) Weight:  [175.5 kg] 175.5 kg (03/14 0646)    Intake/Output from previous day: 03/13 0701 - 03/14 0700 In: 1240 [P.O.:1240] Out: 2750 [Urine:2750] Intake/Output this shift: No intake/output data recorded.  Physical Exam: General appearance: alert, cooperative and no distress Neurologic: intact Heart: regular rate and rhythm, no significant arrhythmias on monitor review. Lungs: clear to auscultation bilaterally Abdomen: Soft and non-tender Wound: The sternal incision is well approximated and dry.  Lab Results: Recent Labs    12/07/19 0536 12/08/19 0415  WBC 13.1* 12.8*  HGB 10.8* 11.9*  HCT 33.1* 35.8*  PLT 142* 188   BMET:  Recent Labs    12/08/19 0415 12/09/19 0342  NA 134* 136  K 3.5 3.9  CL 95* 98  CO2 28 26  GLUCOSE 154* 141*  BUN 12 9  CREATININE 0.78 0.78  CALCIUM 8.4* 8.6*    PT/INR:  Recent Labs    12/09/19 0342  LABPROT 14.0  INR 1.1   ABG    Component Value Date/Time   PHART 7.354 12/05/2019 1940   HCO3 25.4 12/05/2019 1940   TCO2 27 12/05/2019 1940   ACIDBASEDEF 1.0 12/05/2019 1940   O2SAT 94.0 12/05/2019 1940   CBG (last 3)  Recent Labs    12/08/19 1744 12/08/19 2201 12/09/19 0641  GLUCAP 149* 130* 118*    Assessment/Plan: S/P Procedure(s) (LRB): PARTIAL STERNOTOMY FOR AORTIC VALVE REPLACEMENT (AVR), USING CARBOMEDICS  SUPRA-ANNULAR TOP HAT (N/A) TRANSESOPHAGEAL ECHOCARDIOGRAM (TEE) (N/A)  -POD4 AVR for severe AS and moderated AI related to bicuspid aortic valve. Making excellent progress overall.  INR is 1.1 after coumadin 5mg  x 2 doses. Will increase to 7.5mg  dosing this PM. D/C pacer wire.   -Volume excess- Improved. Had net 1.5L diuresis yesterday but Wt increased by several Kg. Doubt this is accurate. Will continue PO lasix 40mg   bid.   -K+ 3.9, will increase K-Dur to tid for another day. Monitor.   -DVT PPX- continue Lovenox until INR approaching therapeutic.    -History of HTN- BP trending up as he recovers. Will increase metoprolol to 25mg  po BID and resume lisinopril at 10mg  daily.   -DM-On Janumet and Lantus at home. Good control with Levemir 20U daily and SSI.   -Mild expected acute blood loss anemia early post-op--Hct trending up.   LOS: 4 days    PA-C 12/09/2019

## 2019-12-10 ENCOUNTER — Encounter (HOSPITAL_COMMUNITY): Payer: Self-pay | Admitting: Thoracic Surgery (Cardiothoracic Vascular Surgery)

## 2019-12-10 LAB — BASIC METABOLIC PANEL
Anion gap: 8 (ref 5–15)
BUN: 9 mg/dL (ref 6–20)
CO2: 29 mmol/L (ref 22–32)
Calcium: 8.9 mg/dL (ref 8.9–10.3)
Chloride: 100 mmol/L (ref 98–111)
Creatinine, Ser: 0.8 mg/dL (ref 0.61–1.24)
GFR calc Af Amer: >60 mL/min (ref >60)
GFR calc non Af Amer: >60 mL/min (ref >60)
Glucose, Bld: 127 mg/dL — ABNORMAL HIGH (ref 70–99)
Potassium: 4.6 mmol/L (ref 3.5–5.1)
Sodium: 137 mmol/L (ref 135–145)

## 2019-12-10 LAB — GLUCOSE, CAPILLARY
Glucose-Capillary: 133 mg/dL — ABNORMAL HIGH (ref 70–99)
Glucose-Capillary: 106 mg/dL — ABNORMAL HIGH (ref 70–99)
Glucose-Capillary: 99 mg/dL (ref 70–99)
Glucose-Capillary: 127 mg/dL — ABNORMAL HIGH (ref 70–99)

## 2019-12-10 LAB — PROTIME-INR
INR: 1.1 (ref 0.8–1.2)
Prothrombin Time: 14.2 seconds (ref 11.4–15.2)

## 2019-12-10 NOTE — Progress Notes (Signed)
Inpatient Rehabilitation Admissions Coordinator  I met with patient at bedside for rehab assessment. He ambulated Independently with PT today 1000 feet without assistive device. He states he has walked in halls 3 times so far today. He will not need an inpt rehab admit prior to d/c home. We will sign off at this time. He is doing very well mobilizing.   Danne Baxter, RN, MSN Rehab Admissions Coordinator [phone] 12/10/2019 1:47 PM

## 2019-12-10 NOTE — Progress Notes (Signed)
CARDIAC REHAB PHASE I   PRE:  Rate/Rhythm: 90 SR  BP:  Supine:   Sitting: 149/96  Standing:    SaO2: 100%RA  MODE:  Ambulation: 470 ft   POST:  Rate/Rhythm: 123 ST  BP:  Supine:   Sitting: 158/86  Standing:    SaO2: 100%RA [id number] Pt standing adhering to sternal precautions and walked 470 ft on RA independently. Stopped once to rest. Pt stated he can tell his breathing is better than before surgery. Pt using IS and can get to 1500 ml or more. Pt has someone who can be with him during the night but not the day. He has aunt who he stated could make sure he has meals. Pt has seen pharmacist re Coumadin. Gave pt sheet with learning videos. Encouraged him to watch discharge and Coumadin videos. Pt stated he likes to learn. Discussed CRP 2 and will send referral to [facility]. Pt is interested in participating in Virtual Cardiac and Pulmonary Rehab. Pt advised that Virtual Cardiac and Pulmonary Rehab is provided at no cost to the patient.  Checklist:  1. Pt has smart device  ie smartphone and/or ipad for downloading an app  Yes 2. Reliable internet/wifi service    Yes 3. Understands how to use their smartphone and navigate within an app.  Yes   Pt verbalized understanding and is in agreement.    Luetta Nutting, RN BSN  12/10/2019 9:09 AM

## 2019-12-10 NOTE — Progress Notes (Signed)
Patient ambulated in hall approx. 300 feet, tolerated well

## 2019-12-10 NOTE — Progress Notes (Addendum)
      [address]       [place] [address]             [phone]      5 Days Post-Op Procedure(s) (LRB): PARTIAL STERNOTOMY FOR AORTIC VALVE REPLACEMENT (AVR), USING CARBOMEDICS SUPRA-ANNULAR TOP HAT (N/A) TRANSESOPHAGEAL ECHOCARDIOGRAM (TEE) (N/A) Subjective: Feels good this morning. Walked with out the walker this morning.   Objective: Vital signs in last 24 hours: Temp:  [97.6 F (36.4 C)-98.6 F (37 C)] 97.6 F (36.4 C) (03/15 0611) Pulse Rate:  [68-122] 82 (03/15 0611) Cardiac Rhythm: Normal sinus rhythm (03/14 1943) Resp:  [13-27] 23 (03/15 0611) BP: (130-162)/(68-89) 148/89 (03/15 0611) SpO2:  [98 %-100 %] 100 % (03/15 0611) Weight:  [175.3 kg] 175.3 kg (03/15 0611)     Intake/Output from previous day: 03/14 0701 - 03/15 0700 In: 240 [P.O.:240] Out: -  Intake/Output this shift: No intake/output data recorded.  General appearance: alert, cooperative and no distress Heart: + mechanical click Lungs: clear to auscultation bilaterally and diminished in the lower lobes Abdomen: soft, non-tender; bowel sounds normal; no masses,  no organomegaly Extremities: extremities normal, atraumatic, no cyanosis or edema Wound: clean and dry with some errythema around the incision.  Lab Results: Recent Labs    12/08/19 0415  WBC 12.8*  HGB 11.9*  HCT 35.8*  PLT 188   BMET:  Recent Labs    12/09/19 0342 12/10/19 0300  NA 136 137  K 3.9 4.6  CL 98 100  CO2 26 29  GLUCOSE 141* 127*  BUN 9 9  CREATININE 0.78 0.80  CALCIUM 8.6* 8.9    PT/INR:  Recent Labs    12/10/19 0300  LABPROT 14.2  INR 1.1   ABG    Component Value Date/Time   PHART 7.354 12/05/2019 1940   HCO3 25.4 12/05/2019 1940   TCO2 27 12/05/2019 1940   ACIDBASEDEF 1.0 12/05/2019 1940   O2SAT 94.0 12/05/2019 1940   CBG (last 3)  Recent Labs    12/09/19 1649 12/09/19 2121 12/10/19 0610  GLUCAP 143* 107* 133*    Assessment/Plan: S/P Procedure(s)  (LRB): PARTIAL STERNOTOMY FOR AORTIC VALVE REPLACEMENT (AVR), USING CARBOMEDICS SUPRA-ANNULAR TOP HAT (N/A) TRANSESOPHAGEAL ECHOCARDIOGRAM (TEE) (N/A)  1. BP well controlled. In NSR in the 80s. Continue ASA, statin, and metoprolol.  2. Pulm-tolerating room air with excellent saturation. Continue incentive spirometer.  3. Renal-creatinine 0.80, electrolytes okay 4. H and H 11.9/35.8, expected acute blood loss anemia 5. Endo-blood glucose well controlled 6. On coumadin 7.5mg , INR 1.1  Plan: Paint incision with betadine. Continue higher dose of coumadin, hopefully will bump tomorrow. Continue ambulation. Will need cardiac rehab/ SNF on discharge.    LOS: 5 days    Sharlene Dory 12/10/2019   I have seen and examined the patient and agree with the assessment and plan as outlined.  Ready for d/c soon.  Await disposition.  Purcell Nails, MD 12/10/2019 8:43 AM

## 2019-12-10 NOTE — Progress Notes (Signed)
MOBILITY TEAM - Progress Note   12/10/19 1100  Mobility  Activity Ambulated in hall  Level of Assistance Independent  Assistive Device None  Distance Ambulated (ft) 1000 ft  Mobility Response Tolerated well   During activity: HR 102, SpO2 100% on RA  Ina Homes, PT, DPT Mobility Team Pager [phone]

## 2019-12-10 NOTE — Social Work (Signed)
CSW consulted for SNF placement. In reviewing PT notes  patient has progressed at a level that is not appropriate for SNF.   Antony Blackbird, MSW, LCSWA Clinical Social Worker

## 2019-12-10 NOTE — Progress Notes (Addendum)
Transitions of Care Pharmacist Note  Adam Long is a 44 y.o. male that has been diagnosed with AVR and will be prescribed Coumadin (warfarin)  at discharge.   Patient Education: I provided the following education on warfarin 3/12 to the patient: How to take the medication Described what the medication is Signs of bleeding Signs/symptoms of VTE and stroke  Answered their questions Dietary and medication precautions with warfarin   Discharge Medications Plan: The patient is not interested in filling their discharge medications with the Transitions of Care pharmacy at this time.   If the patient later decides they would like to have the Transitions of Care pharmacy fill their discharge medications, please call us at [phone]. Thank you.    Thank you,   Alvia Grove, PharmD PGY1 Acute Care Pharmacy Resident December 10, 2019

## 2019-12-11 LAB — PROTIME-INR
INR: 1.1 (ref 0.8–1.2)
Prothrombin Time: 14 seconds (ref 11.4–15.2)

## 2019-12-11 LAB — GLUCOSE, CAPILLARY
Glucose-Capillary: 115 mg/dL — ABNORMAL HIGH (ref 70–99)
Glucose-Capillary: 130 mg/dL — ABNORMAL HIGH (ref 70–99)
Glucose-Capillary: 109 mg/dL — ABNORMAL HIGH (ref 70–99)
Glucose-Capillary: 137 mg/dL — ABNORMAL HIGH (ref 70–99)

## 2019-12-11 MED ORDER — WARFARIN SODIUM 10 MG PO TABS
10.0000 mg | ORAL_TABLET | Freq: Every day | ORAL | Status: DC
  Administered 2019-12-11: 17:00:00 10 mg via ORAL
  Filled 2019-12-11: qty 1

## 2019-12-11 MED ORDER — ATORVASTATIN CALCIUM 40 MG PO TABS
40.0000 mg | ORAL_TABLET | Freq: Every day | ORAL | Status: DC
  Administered 2019-12-11 – 2019-12-12 (×2): 40 mg via ORAL
  Filled 2019-12-11 (×2): qty 1

## 2019-12-11 MED ORDER — LISINOPRIL 10 MG PO TABS
20.0000 mg | ORAL_TABLET | Freq: Every day | ORAL | Status: DC
  Administered 2019-12-11 – 2019-12-12 (×2): 20 mg via ORAL
  Filled 2019-12-11 (×2): qty 2

## 2019-12-11 NOTE — Progress Notes (Addendum)
      [address]       [place] [address]             [phone]      6 Days Post-Op Procedure(s) (LRB): PARTIAL STERNOTOMY FOR AORTIC VALVE REPLACEMENT (AVR), USING CARBOMEDICS SUPRA-ANNULAR TOP HAT (N/A) TRANSESOPHAGEAL ECHOCARDIOGRAM (TEE) (N/A) Subjective: Feels good this morning and wants to go home.  Objective: Vital signs in last 24 hours: Temp:  [97.6 F (36.4 C)-98.1 F (36.7 C)] 98.1 F (36.7 C) (03/16 0611) Pulse Rate:  [78-89] 89 (03/16 0611) Cardiac Rhythm: Normal sinus rhythm (03/15 1900) Resp:  [16-22] 18 (03/16 0611) BP: (105-149)/(70-82) 149/82 (03/16 0611) SpO2:  [99 %-100 %] 100 % (03/16 0611) Weight:  [171.5 kg] 171.5 kg (03/16 0608)     Intake/Output from previous day: 03/15 0701 - 03/16 0700 In: 240 [P.O.:240] Out: -  Intake/Output this shift: No intake/output data recorded.  General appearance: alert, cooperative and no distress Heart: regular rate and rhythm, S1, S2 normal, no murmur, click, rub or gallop Lungs: clear to auscultation bilaterally Abdomen: soft, non-tender; bowel sounds normal; no masses,  no organomegaly Extremities: extremities normal, atraumatic, no cyanosis or edema Wound: clean and dry  Lab Results: No results for input(s): WBC, HGB, HCT, PLT in the last 72 hours. BMET:  Recent Labs    12/09/19 0342 12/10/19 0300  NA 136 137  K 3.9 4.6  CL 98 100  CO2 26 29  GLUCOSE 141* 127*  BUN 9 9  CREATININE 0.78 0.80  CALCIUM 8.6* 8.9    PT/INR:  Recent Labs    12/11/19 0204  LABPROT 14.0  INR 1.1   ABG    Component Value Date/Time   PHART 7.354 12/05/2019 1940   HCO3 25.4 12/05/2019 1940   TCO2 27 12/05/2019 1940   ACIDBASEDEF 1.0 12/05/2019 1940   O2SAT 94.0 12/05/2019 1940   CBG (last 3)  Recent Labs    12/10/19 1653 12/10/19 2141 12/11/19 0607  GLUCAP 99 127* 115*    Assessment/Plan: S/P Procedure(s) (LRB): PARTIAL STERNOTOMY FOR AORTIC VALVE REPLACEMENT (AVR), USING  CARBOMEDICS SUPRA-ANNULAR TOP HAT (N/A) TRANSESOPHAGEAL ECHOCARDIOGRAM (TEE) (N/A)  1. BP well controlled. In NSR in the 80s. Continue ASA, statin, and metoprolol.  2. Pulm-tolerating room air with excellent saturation. Continue incentive spirometer.  3. Renal-creatinine 0.80, electrolytes okay 4. H and H 11.9/35.8, expected acute blood loss anemia 5. Endo-blood glucose well controlled 6.Increase Coumadin to 10mg , INR 1.1  Plan: Instructed to paint his incision once a day for a week with betadine stick when he goes home. Incision looking much better. INR remains subtherapeutic at 1.1, increased coumadin to 10mg . He has an INR draw set up for Thursday 3/18.    LOS: 6 days    12/11/2019  I have seen and examined the patient and agree with the assessment and plan as outlined.  Tentatively plan d/c home tomorrow.  Sharlene Dory, MD 12/11/2019 8:35 AM

## 2019-12-11 NOTE — Progress Notes (Signed)
[id number] Education completed with pt who voiced understanding. Stressed importance of sternal precautions and staying in the tube. Encouraged IS. Gave diabetic and heart healthy diets. Gave walking instructions for ex. Pt voiced understanding. Is going to try videos today as system not working yesterday. Referral to  CRP 2 done. Since pt walking independently, will sign off. Luetta Nutting RN BSN 12/11/2019 11:52 AM

## 2019-12-11 NOTE — Plan of Care (Signed)

## 2019-12-12 LAB — PROTIME-INR
INR: 1.3 — ABNORMAL HIGH (ref 0.8–1.2)
Prothrombin Time: 15.6 seconds — ABNORMAL HIGH (ref 11.4–15.2)

## 2019-12-12 LAB — GLUCOSE, CAPILLARY: Glucose-Capillary: 130 mg/dL — ABNORMAL HIGH (ref 70–99)

## 2019-12-12 MED ORDER — WARFARIN SODIUM 10 MG PO TABS: 10.0000 mg | ORAL_TABLET | Freq: Every day | ORAL | 1 refills | Status: DC

## 2019-12-12 MED ORDER — OXYCODONE HCL 5 MG PO TABS: 5.0000 mg | ORAL_TABLET | Freq: Four times a day (QID) | ORAL | 0 refills | Status: DC | PRN

## 2019-12-12 MED ORDER — METOPROLOL TARTRATE 25 MG PO TABS: 25.0000 mg | ORAL_TABLET | Freq: Two times a day (BID) | ORAL | 1 refills | Status: DC

## 2019-12-12 MED ORDER — POTASSIUM CHLORIDE CRYS ER 20 MEQ PO TBCR: 20.0000 meq | EXTENDED_RELEASE_TABLET | Freq: Every day | ORAL | 0 refills | Status: DC

## 2019-12-12 MED ORDER — FUROSEMIDE 40 MG PO TABS: 40.0000 mg | ORAL_TABLET | Freq: Every day | ORAL | 0 refills | Status: DC

## 2019-12-12 NOTE — Progress Notes (Addendum)
      [address]       [place] [address]             [phone]      7 Days Post-Op Procedure(s) (LRB): PARTIAL STERNOTOMY FOR AORTIC VALVE REPLACEMENT (AVR), USING CARBOMEDICS SUPRA-ANNULAR TOP HAT (N/A) TRANSESOPHAGEAL ECHOCARDIOGRAM (TEE) (N/A) Subjective: Feels okay this morning. Wanting to go home.   Objective: Vital signs in last 24 hours: Temp:  [98 F (36.7 C)-98.3 F (36.8 C)] 98.1 F (36.7 C) (03/17 0447) Pulse Rate:  [88-94] 92 (03/17 0447) Cardiac Rhythm: Normal sinus rhythm (03/17 0305) Resp:  [19-23] 23 (03/17 0447) BP: (104-159)/(67-90) 128/82 (03/17 0447) SpO2:  [95 %-100 %] 95 % (03/17 0447) Weight:  [168.2 kg] 168.2 kg (03/17 0447)     Intake/Output from previous day: 03/16 0701 - 03/17 0700 In: 600 [P.O.:600] Out: -  Intake/Output this shift: No intake/output data recorded.  General appearance: alert, cooperative and no distress Heart: regular rate and rhythm, S1, S2 normal, no murmur, click, rub or gallop Lungs: clear to auscultation bilaterally Abdomen: soft, non-tender; bowel sounds normal; no masses,  no organomegaly Extremities: extremities normal, atraumatic, no cyanosis or edema Wound: clean and dry  Lab Results: No results for input(s): WBC, HGB, HCT, PLT in the last 72 hours. BMET:  Recent Labs    12/10/19 0300  NA 137  K 4.6  CL 100  CO2 29  GLUCOSE 127*  BUN 9  CREATININE 0.80  CALCIUM 8.9    PT/INR:  Recent Labs    12/12/19 0346  LABPROT 15.6*  INR 1.3*   ABG    Component Value Date/Time   PHART 7.354 12/05/2019 1940   HCO3 25.4 12/05/2019 1940   TCO2 27 12/05/2019 1940   ACIDBASEDEF 1.0 12/05/2019 1940   O2SAT 94.0 12/05/2019 1940   CBG (last 3)  Recent Labs    12/11/19 1655 12/11/19 2108 12/12/19 0603  GLUCAP 109* 137* 130*    Assessment/Plan: S/P Procedure(s) (LRB): PARTIAL STERNOTOMY FOR AORTIC VALVE REPLACEMENT (AVR), USING CARBOMEDICS SUPRA-ANNULAR TOP HAT  (N/A) TRANSESOPHAGEAL ECHOCARDIOGRAM (TEE) (N/A)  1. CV-BP well controlled. In NSR in the 90s. Continue ASA, statin, and metoprolol.  2. Pulm-tolerating room air with excellent saturation. Continue incentive spirometer.  3. Renal-creatinine 0.80, electrolytes okay. On 40mg  BID of lasix. Will decrease to 40mg  daily x 5 days for home. Instructed to take daily weights.  4. H and H 11.9/35.8, expected acute blood loss anemia 5. Endo-blood glucose well controlled 6. Continue Coumadin at 10mg , INR 1.3  Plan: INR finally bumped up to 1.3. Tomorrow he has an INR draw scheduled. Will discharge today if okay with Dr. . Discharge instructions explained to the patient in detail. All questions answered.    LOS: 7 days    04-05-1998 12/12/2019    I have seen and examined the patient and agree with the assessment and plan as outlined.  D/C home today on Coumadin 10 mg/day  Cornelius Moras, MD 12/12/2019 8:09 AM

## 2019-12-12 NOTE — Progress Notes (Signed)
CARDIAC REHAB PHASE I   Spoke with pt as he was ambulating in the hallway independently. Encouraged continued ambulation and IS use at home. Reviewed site care and monitoring incision daily. Pt referred to CRP II [place].  [phone] Reynold Bowen, RN BSN 12/12/2019 9:58 AM

## 2019-12-12 NOTE — Progress Notes (Signed)
Discharge instructions given to patient. Chest tube sutures removed. IV removed, clean and intact. Medications and wound care reviewed. All questions answered. Pt escorted home with aunt.   Hazle Nordmann, RN

## 2019-12-13 ENCOUNTER — Ambulatory Visit (INDEPENDENT_AMBULATORY_CARE_PROVIDER_SITE_OTHER): Payer: Self-pay | Admitting: *Deleted

## 2019-12-13 ENCOUNTER — Other Ambulatory Visit: Payer: Self-pay

## 2019-12-13 DIAGNOSIS — Z0000 Encounter for general adult medical examination without abnormal findings: Secondary | ICD-10-CM | POA: Insufficient documentation

## 2019-12-13 DIAGNOSIS — Z5181 Encounter for therapeutic drug level monitoring: Secondary | ICD-10-CM

## 2019-12-13 DIAGNOSIS — Z952 Presence of prosthetic heart valve: Secondary | ICD-10-CM

## 2019-12-13 LAB — POCT INR: INR: 1.4 — AB (ref 2.0–3.0)

## 2019-12-13 NOTE — Patient Instructions (Signed)
D/C from [facility] 3/17 on warfarin 10mg  daily Take warfarin 15mg  tonight then resume 10mg  daily. Recheck on Monday 3/22

## 2019-12-14 ENCOUNTER — Other Ambulatory Visit: Payer: Self-pay | Admitting: Physician Assistant

## 2019-12-14 MED ORDER — METOPROLOL TARTRATE 25 MG PO TABS: 25.0000 mg | ORAL_TABLET | Freq: Two times a day (BID) | ORAL | 1 refills | Status: DC

## 2019-12-14 MED ORDER — LISINOPRIL 20 MG PO TABS: 20.0000 mg | ORAL_TABLET | Freq: Every day | ORAL | 0 refills | Status: DC

## 2019-12-14 MED ORDER — SITAGLIPTIN PHOS-METFORMIN HCL 50-500 MG PO TABS: 1.0000 | ORAL_TABLET | Freq: Two times a day (BID) | ORAL | 0 refills | Status: DC

## 2019-12-17 ENCOUNTER — Ambulatory Visit (INDEPENDENT_AMBULATORY_CARE_PROVIDER_SITE_OTHER): Payer: Self-pay | Admitting: *Deleted

## 2019-12-17 ENCOUNTER — Other Ambulatory Visit: Payer: Self-pay

## 2019-12-17 DIAGNOSIS — Z952 Presence of prosthetic heart valve: Secondary | ICD-10-CM

## 2019-12-17 DIAGNOSIS — Z5181 Encounter for therapeutic drug level monitoring: Secondary | ICD-10-CM

## 2019-12-17 LAB — POCT INR: INR: 1.5 — AB (ref 2.0–3.0)

## 2019-12-17 NOTE — Patient Instructions (Signed)
D/C from [facility] 3/17 on warfarin 10mg  daily Increase warfarin to 1 tablet daily except 1 1/2 tablets on Mondays, Wednesdays and Fridays Recheck on Monday 3/29

## 2019-12-24 ENCOUNTER — Ambulatory Visit (INDEPENDENT_AMBULATORY_CARE_PROVIDER_SITE_OTHER): Payer: Self-pay | Admitting: *Deleted

## 2019-12-24 ENCOUNTER — Other Ambulatory Visit: Payer: Self-pay

## 2019-12-24 DIAGNOSIS — Z5181 Encounter for therapeutic drug level monitoring: Secondary | ICD-10-CM

## 2019-12-24 DIAGNOSIS — Z952 Presence of prosthetic heart valve: Secondary | ICD-10-CM

## 2019-12-24 LAB — POCT INR: INR: 1.9 — AB (ref 2.0–3.0)

## 2019-12-24 NOTE — Patient Instructions (Signed)
D/C from [facility] 3/17 on warfarin 10mg  daily Increase warfarin to 1 1/2 tablets daily except 1 tablet on Sundays, Tuesdays and Thursdays Recheck on Monday 12/31/19

## 2019-12-28 ENCOUNTER — Other Ambulatory Visit: Payer: Self-pay

## 2019-12-28 ENCOUNTER — Encounter: Payer: Self-pay | Admitting: Student

## 2019-12-28 ENCOUNTER — Ambulatory Visit (INDEPENDENT_AMBULATORY_CARE_PROVIDER_SITE_OTHER): Payer: Self-pay | Admitting: Student

## 2019-12-28 VITALS — BP 110/64 | HR 71 | Temp 97.6°F | Ht 70.0 in | Wt 372.0 lb

## 2019-12-28 DIAGNOSIS — I2510 Atherosclerotic heart disease of native coronary artery without angina pectoris: Secondary | ICD-10-CM

## 2019-12-28 DIAGNOSIS — E119 Type 2 diabetes mellitus without complications: Secondary | ICD-10-CM

## 2019-12-28 DIAGNOSIS — Z952 Presence of prosthetic heart valve: Secondary | ICD-10-CM

## 2019-12-28 DIAGNOSIS — E785 Hyperlipidemia, unspecified: Secondary | ICD-10-CM

## 2019-12-28 DIAGNOSIS — Z794 Long term (current) use of insulin: Secondary | ICD-10-CM

## 2019-12-28 DIAGNOSIS — I10 Essential (primary) hypertension: Secondary | ICD-10-CM

## 2019-12-28 NOTE — Patient Instructions (Signed)
Medication Instructions:  Your physician recommends that you continue on your current medications as directed. Please refer to the Current Medication list given to you today.  *If you need a refill on your cardiac medications before your next appointment, please call your pharmacy*   Lab Work: None today If you have labs (blood work) drawn today and your tests are completely normal, you will receive your results only by: Marland Kitchen MyChart Message (if you have MyChart) OR . A paper copy in the mail If you have any lab test that is abnormal or we need to change your treatment, we will call you to review the results.   Testing/Procedures: None today   Follow-Up: At [facility], you and your health needs are our priority.  As part of our continuing mission to provide you with exceptional heart care, we have created designated Provider Care Teams.  These Care Teams include your primary Cardiologist (physician) and Advanced Practice Providers (APPs -  Physician Assistants and Nurse Practitioners) who all work together to provide you with the care you need, when you need it.  We recommend signing up for the patient portal called "MyChart".  Sign up information is provided on this After Visit Summary.  MyChart is used to connect with patients for Virtual Visits (Telemedicine).  Patients are able to view lab/test results, encounter notes, upcoming appointments, etc.  Non-urgent messages can be sent to your provider as well.   To learn more about what you can do with MyChart, go to [email or web address].    Your next appointment:   3 month(s)  The format for your next appointment:   In Person  Provider:   Prentice Docker, MD   Other Instructions None      Thank you for choosing [facility] !

## 2019-12-28 NOTE — Progress Notes (Signed)
Cardiology Office Note    Date:  12/28/2019   ID:  Adam Long, DOB 1976/05/28, MRN [id number]  PCP:  Jacquelin Hawking, PA-C  Cardiologist: Prentice Docker, MD    Chief Complaint  Patient presents with  . Hospitalization Follow-up    s/p AVR    History of Present Illness:    Adam Long is a 44 y.o. male with past medical history of severe aortic stenosis, CAD (moderate mid-LAD stenosis by cath in 08/2019), HTN, HLD, Type 2 DM and morbid obesity who presents to the office today for hospital follow-up.   He was last examined by Dr. Purvis Sheffield in 09/2019 and was being followed by Dr. Excell Seltzer and Dr. Cornelius Moras with the [facility] with plans for upcoming aortic valve replacement. No changes were made to his medication regimen at that time.   He most recently presented to [facility] on 12/05/2019 for planned AVR and had previously decided to proceed with mechanical valve replacement. Underwent partial upper hemi-sternotomy with placement of a carbomedics top hat bileaflet mechanical valve. He progressed well in the postoperative setting and maintained normal sinus rhythm. He was started on Coumadin for anticoagulation given his mechanical valve and was on Lovenox bridging. INR was 1.3 at the time of discharge on 12/12/2019.  He did follow-up with the Coumadin clinic on 3/22 and INR was at 1.5 with dosing appropriately adjusted. INR checked again on 3/29 and had improved to 1.9.  In talking with the patient today, he reports significant improvement in his symptoms following surgery. He has been walking outside for exercise and reports his exercise tolerance has significantly improved since undergoing valve replacement. He denies any exertional chest discomfort. Does experience occasional pains along his sternal region and has been taking Tylenol as needed with improvement. He denies any orthopnea, PND or lower extremity edema. No recent palpitations, dizziness or recurrent  syncope.  He reports good compliance with Coumadin and denies any melena, hematochezia or hematuria.    Past Medical History:  Diagnosis Date  . Anxiety   . Aortic stenosis   . Cholelithiasis   . Coronary artery disease    non-obstructive CAD (40-50% LAD) 08/2019  . Depression   . Diabetes mellitus   . Dyspnea    per pt upon exertion  . Heart murmur    per pt 10/24/19 dx in 06/2019  . Hypercholesteremia   . Hypertension   . Morbid obesity (HCC)   . S/P aortic valve replacement with mechanical valve 12/05/2019   25 mm Carbomedics top hat bileaflet mechanical valve via partial upper hemi-sternotomy    Past Surgical History:  Procedure Laterality Date  . ABDOMINAL AORTOGRAM W/LOWER EXTREMITY N/A 07/06/2019   Procedure: ABDOMINAL AORTOGRAM W/LOWER EXTREMITY;  Surgeon: Sherren Kerns, MD;  Location: [facility];  Service: Cardiovascular;  Laterality: N/A;  Bilateral  . AMPUTATION Left 07/09/2019   Procedure: LEFT FOURTH and Fifth TOE AMPUTATION.;  Surgeon: Larina Earthly, MD;  Location: [facility] OR;  Service: Vascular;  Laterality: Left;  . AORTIC VALVE REPLACEMENT N/A 12/05/2019   Procedure: PARTIAL STERNOTOMY FOR AORTIC VALVE REPLACEMENT (AVR), USING CARBOMEDICS SUPRA-ANNULAR TOP HAT ;  Surgeon: Purcell Nails, MD;  Location: [facility] OR;  Service: Open Heart Surgery;  Laterality: N/A;  No neck lines on left  . IR RADIOLOGY PERIPHERAL GUIDED IV START  10/05/2019  . IR US GUIDE VASC ACCESS RIGHT  10/05/2019  . MULTIPLE EXTRACTIONS WITH ALVEOLOPLASTY N/A 10/26/2019   Procedure: EXTRACTION OF TOOTH #'  S 3, 5-11,19-28,  AND 32 WITH ALVEOLOPLASTY;  Surgeon: Lenn Cal, DDS;  Location: [place];  Service: Oral Surgery;  Laterality: N/A;  . RIGHT HEART CATH AND CORONARY ANGIOGRAPHY N/A 09/24/2019   Procedure: RIGHT HEART CATH AND CORONARY ANGIOGRAPHY;  Surgeon: Sherren Mocha, MD;  Location: [place] CV [facility];  Service: Cardiovascular;  Laterality: N/A;  . TEE WITHOUT CARDIOVERSION N/A  12/05/2019   Procedure: TRANSESOPHAGEAL ECHOCARDIOGRAM (TEE);  Surgeon: Rexene Alberts, MD;  Location: [place];  Service: Open Heart Surgery;  Laterality: N/A;    Current Medications: Outpatient Medications Prior to Visit  Medication Sig Dispense Refill  . aspirin EC 81 MG tablet Take 81 mg by mouth every morning.     Marland Kitchen atorvastatin (LIPITOR) 40 MG tablet Take 1 tablet (40 mg total) by mouth daily. (Patient taking differently: Take 40 mg by mouth every morning. ) 90 tablet 1  . Insulin Glargine (LANTUS SOLOSTAR) 100 UNIT/ML Solostar Pen Inject 40 Units into the skin at bedtime. 15 mL   . lisinopril (ZESTRIL) 20 MG tablet Take 1 tablet (20 mg total) by mouth daily. 90 tablet 0  . metoprolol tartrate (LOPRESSOR) 25 MG tablet Take 1 tablet (25 mg total) by mouth 2 (two) times daily. 180 tablet 1  . oxyCODONE (OXY IR/ROXICODONE) 5 MG immediate release tablet Take 1 tablet (5 mg total) by mouth every 6 (six) hours as needed for severe pain. 30 tablet 0  . potassium chloride SA (KLOR-CON) 20 MEQ tablet Take 1 tablet (20 mEq total) by mouth daily for 5 doses. 5 tablet 0  . sitaGLIPtin-metformin (JANUMET) 50-500 MG tablet Take 1 tablet by mouth 2 (two) times daily with a meal. 180 tablet 0  . warfarin (COUMADIN) 10 MG tablet Take 1 tablet (10 mg total) by mouth daily at 6 PM. 30 tablet 1  . furosemide (LASIX) 40 MG tablet Take 1 tablet (40 mg total) by mouth daily for 5 days. 5 tablet 0   No facility-administered medications prior to visit.     Allergies:   Pollen extract   Social History   Socioeconomic History  . Marital status: Single    Spouse name: Not on file  . Number of children: Not on file  . Years of education: Not on file  . Highest education level: Not on file  Occupational History  . Not on file  Tobacco Use  . Smoking status: Never Smoker  . Smokeless tobacco: Former Systems developer    Types: Chew  Substance and Sexual Activity  . Alcohol use: Not Currently    Comment: occasionally   . Drug use: No  . Sexual activity: Not on file  Other Topics Concern  . Not on file  Social History Narrative  . Not on file   Social Determinants of Health   Financial Resource Strain:   . Difficulty of Paying Living Expenses:   Food Insecurity:   . Worried About Charity fundraiser in the Last Year:   . Arboriculturist in the Last Year:   Transportation Needs:   . Film/video editor (Medical):   Marland Kitchen Lack of Transportation (Non-Medical):   Physical Activity:   . Days of Exercise per Week:   . Minutes of Exercise per Session:   Stress:   . Feeling of Stress :   Social Connections:   . Frequency of Communication with Friends and Family:   . Frequency of Social Gatherings with Friends and Family:   . Attends Religious  Services:   . Active Member of Clubs or Organizations:   . Attends Banker Meetings:   Marland Kitchen Marital Status:      Family History:  The patient's family history includes Cancer in his mother; Heart disease in his father; Heart murmur in his sister; Hyperlipidemia in his father; Hypertension in his father; Stroke in his father.   Review of Systems:   Please see the history of present illness.     General:  No chills, fever, night sweats or weight changes.  Cardiovascular:  No chest pain, edema, orthopnea, palpitations, paroxysmal nocturnal dyspnea. Positive for dyspnea on exertion (improved).  Dermatological: No rash, lesions/masses Respiratory: No cough, dyspnea Urologic: No hematuria, dysuria Abdominal:   No nausea, vomiting, diarrhea, bright red blood per rectum, melena, or hematemesis Neurologic:  No visual changes, wkns, changes in mental status. All other systems reviewed and are otherwise negative except as noted above.   Physical Exam:    VS:  BP 110/64 (BP Location: Left Arm)   Pulse 71   Temp 97.6 F (36.4 C)   Ht 5\' 10"  (1.778 m)   Wt (!) 372 lb (168.7 kg)   SpO2 99%   BMI 53.38 kg/m    General: Well developed, well  nourished,male appearing in no acute distress. Head: Normocephalic, atraumatic, sclera non-icteric.  Neck: No carotid bruits. JVD not elevated.  Lungs: Respirations regular and unlabored, without wheezes or rales.  Heart: Regular rate and rhythm. No S3 or S4.  No murmur. Crisp mechanical valve sounds appreciated. Sternal incision appears well-healing without erythema or drainage.  Abdomen: Soft, non-tender, non-distended. No obvious abdominal masses. Msk:  Strength and tone appear normal for age. No obvious joint deformities or effusions. Extremities: No clubbing or cyanosis. No edema.  Distal pedal pulses are 2+ bilaterally. Neuro: Alert and oriented X 3. Moves all extremities spontaneously. No focal deficits noted. Psych:  Responds to questions appropriately with a normal affect. Skin: No rashes or lesions noted  Wt Readings from Last 3 Encounters:  12/28/19 (!) 372 lb (168.7 kg)  12/12/19 (!) 370 lb 12.8 oz (168.2 kg)  12/03/19 (!) 382 lb (173.3 kg)     Studies/Labs Reviewed:   EKG:  EKG is not ordered today.    Recent Labs: 12/03/2019: ALT 27 12/06/2019: Magnesium 2.1 12/08/2019: Hemoglobin 11.9; Platelets 188 12/10/2019: BUN 9; Creatinine, Ser 0.80; Potassium 4.6; Sodium 137   Lipid Panel    Component Value Date/Time   CHOL 160 10/22/2019 0900   TRIG 119 10/22/2019 0900   HDL 38 (L) 10/22/2019 0900   CHOLHDL 4.2 10/22/2019 0900   VLDL 24 10/22/2019 0900   LDLCALC 98 10/22/2019 0900    Additional studies/ records that were reviewed today include:   Cardiac Catheterization: 08/2019 1.  Calcified coronary arteries with moderate mid LAD stenosis, otherwise mild nonobstructive disease 2.  Known severe aortic stenosis by echo assessment with heavy calcification and restriction of the aortic valve leaflets as assessed by plain fluoroscopy 3.  Severe pulmonary hypertension with mean PA pressure 51 mmHg, at least in part to left heart disease with elevated wedge pressure of 31  mmHg  Recommendations: Ongoing multidisciplinary heart team evaluation for treatment of severe symptomatic aortic stenosis.  Medical therapy for nonobstructive CAD.  TEE: 11/2019 POST-OP IMPRESSIONS  - Left Ventricle: The left ventricle is unchanged from pre-bypass.  - Aorta: The aorta appears unchanged from pre-bypass.  - Left Atrial Appendage: The left atrial appendage appears unchanged from  pre-bypass.  - Aortic  Valve: No stenosis present. A bileaflet mechanical valve was  placed,  leaflets are freely mobile Size; 25mm. There is no regurgitation. No  regurgitation post repair. Normal washing jets for valve type. The  gradient  recorded across the prosthetic valve is within the expected range,  measuring 229  cm/s. No perivalvular leak noted.  - Mitral Valve: The mitral valve appears unchanged from pre-bypass.  - Tricuspid Valve: The tricuspid valve appears unchanged from pre-bypass.  - Interatrial Septum: The interatrial septum appears unchanged from  pre-bypass.   Assessment:    1. S/P AVR (aortic valve replacement)   2. Coronary artery disease involving native coronary artery of native heart without angina pectoris   3. Essential hypertension   4. Hyperlipidemia LDL goal <70   5. Uncontrolled type 2 diabetes mellitus with complication (HCC)      Plan:   In order of problems listed above:  1. Severe Aortic Stenosis - he is s/p mechanical AVR on 12/05/2019 with placement of a carbomedics top hat bileaflet mechanical valve.  - He has overall progressed well since surgery and is gradually increasing his exercise tolerance at home.   - INR being followed by the Coumadin clinic went and was improved to 1.9 on most recent check with follow-up INR scheduled for 12/31/2019. He is tolerating this well and denies any evidence of active bleeding.    2. CAD - prior catheterization in 08/2019 showed moderate mid-LAD stenosis as outlined above. He denies any recent chest pain and his  dyspnea continues to improve following recent valve replacement. - Continue ASA, beta-blocker and statin therapy.  3. HTN - BP is well controlled at 110/64 during today's visit. Continue Lisinopril 20 mg daily and Lopressor 25 mg twice daily.  4. HLD - FLP in 09/2019 showed total cholesterol 160, triglycerides 119, HDL 38 and LDL 98. He had recently been started on Atorvastatin 40 mg daily prior to that and is due for repeat fasting labs. Goal LDL is less than 70 in the setting of documented CAD. - Continue Atorvastatin 40 mg daily.  5. Type 2 DM - Hgb A1c improved to 7.2 on most recent check. He has made several dietary modifications and has lost over 20 pounds within the past 2 months. Was congratulated on this with continued weight loss encouraged.  Medication Adjustments/Labs and Tests Ordered: Current medicines are reviewed at length with the patient today.  Concerns regarding medicines are outlined above.  Medication changes, Labs and Tests ordered today are listed in the Patient Instructions below. Patient Instructions  Medication Instructions:  Your physician recommends that you continue on your current medications as directed. Please refer to the Current Medication list given to you today.  *If you need a refill on your cardiac medications before your next appointment, please call your pharmacy*   Lab Work: None today If you have labs (blood work) drawn today and your tests are completely normal, you will receive your results only by: Marland Kitchen. MyChart Message (if you have MyChart) OR . A paper copy in the mail If you have any lab test that is abnormal or we need to change your treatment, we will call you to review the results.   Testing/Procedures: None today   Follow-Up: At [facility], you and your health needs are our priority.  As part of our continuing mission to provide you with exceptional heart care, we have created designated Provider Care Teams.  These Care Teams  include your primary Cardiologist (physician) and Advanced Practice  Providers (APPs -  Physician Assistants and Nurse Practitioners) who all work together to provide you with the care you need, when you need it.  We recommend signing up for the patient portal called "MyChart".  Sign up information is provided on this After Visit Summary.  MyChart is used to connect with patients for Virtual Visits (Telemedicine).  Patients are able to view lab/test results, encounter notes, upcoming appointments, etc.  Non-urgent messages can be sent to your provider as well.   To learn more about what you can do with MyChart, go to [email or web address].    Your next appointment:   3 month(s)  The format for your next appointment:   In Person  Provider:   Prentice Docker, MD   Other Instructions None   Thank you for choosing [facility] !         Signed, Ellsworth Lennox, PA-C  12/28/2019 4:47 PM     [facility] [address] [place], [place] [address] Phone: [phone] Fax: [phone]

## 2019-12-31 ENCOUNTER — Ambulatory Visit (INDEPENDENT_AMBULATORY_CARE_PROVIDER_SITE_OTHER): Payer: Self-pay | Admitting: *Deleted

## 2019-12-31 ENCOUNTER — Other Ambulatory Visit: Payer: Self-pay | Admitting: Thoracic Surgery (Cardiothoracic Vascular Surgery)

## 2019-12-31 ENCOUNTER — Other Ambulatory Visit: Payer: Self-pay

## 2019-12-31 DIAGNOSIS — Z952 Presence of prosthetic heart valve: Secondary | ICD-10-CM

## 2019-12-31 DIAGNOSIS — Z5181 Encounter for therapeutic drug level monitoring: Secondary | ICD-10-CM

## 2019-12-31 LAB — POCT INR: INR: 2.5 (ref 2.0–3.0)

## 2019-12-31 MED ORDER — WARFARIN SODIUM 10 MG PO TABS: ORAL_TABLET | ORAL | 3 refills | Status: DC

## 2019-12-31 NOTE — Patient Instructions (Signed)
Continue warfarin 1 1/2 tablets daily except 1 tablet on Sundays, Tuesdays and Thursdays Recheck on Monday 01/14/20

## 2020-01-01 ENCOUNTER — Telehealth: Payer: Self-pay | Admitting: *Deleted

## 2020-01-01 ENCOUNTER — Ambulatory Visit (INDEPENDENT_AMBULATORY_CARE_PROVIDER_SITE_OTHER): Payer: Self-pay | Admitting: Physician Assistant

## 2020-01-01 ENCOUNTER — Ambulatory Visit
Admission: RE | Admit: 2020-01-01 | Discharge: 2020-01-01 | Disposition: A | Discharge status: Home / Self Care | Payer: Self-pay | Source: Ambulatory Visit | Attending: Thoracic Surgery (Cardiothoracic Vascular Surgery) | Admitting: Thoracic Surgery (Cardiothoracic Vascular Surgery)

## 2020-01-01 VITALS — BP 104/66 | HR 63 | Temp 98.4°F | Resp 20 | Ht 70.0 in | Wt 369.5 lb

## 2020-01-01 DIAGNOSIS — Z952 Presence of prosthetic heart valve: Secondary | ICD-10-CM

## 2020-01-01 DIAGNOSIS — Z9889 Other specified postprocedural states: Secondary | ICD-10-CM

## 2020-01-01 DIAGNOSIS — I350 Nonrheumatic aortic (valve) stenosis: Secondary | ICD-10-CM

## 2020-01-01 NOTE — Telephone Encounter (Signed)
-----   Message from Ellsworth Lennox, [place] sent at 12/31/2019  7:43 AM EDT ----- Regarding: Follow-up Echocardiogram Adam Long,   Please let the patient know CT Surgery would like for him to have an echocardiogram 6-weeks out from surgery and please assist with scheduling this. Surgery was performed on 3/10 so would anticipate the echocardiogram being performed the last week of April. Associations are Severe Aortic Stenosis and S/p AVR.   Thanks,  [place]

## 2020-01-01 NOTE — Patient Instructions (Addendum)
You may return to driving an automobile as long as you are no longer requiring oral narcotic pain relievers during the daytime.  It would be wise to start driving only short distances during the daylight and gradually increase from there as you feel comfortable.  Endocarditis is a potentially serious infection of heart valves or inside lining of the heart.  It occurs more commonly in patients with diseased heart valves (such as patient's with aortic or mitral valve disease) and in patients who have undergone heart valve repair or replacement.  Certain surgical and dental procedures may put you at risk, such as dental cleaning, other dental procedures, or any surgery involving the respiratory, urinary, gastrointestinal tract, gallbladder or prostate gland.   To minimize your chances for developing endocarditis, maintain good oral health and seek prompt medical attention for any infections involving the mouth, teeth, gums, skin or urinary tract.    Always notify your doctor or dentist about your underlying heart valve condition before having any invasive procedures. You will need to take antibiotics before certain procedures, including all routine dental cleanings or other dental procedures.  Your cardiologist or dentist should prescribe these antibiotics for you to be taken ahead of time.   You are encouraged to enroll and participate in the outpatient cardiac rehab program beginning as soon as practical.   You may continue to gradually increase your physical activity as tolerated.  Refrain from any heavy lifting or strenuous use of your arms and shoulders until at least 8 weeks from the time of your surgery, and avoid activities that cause increased pain in your chest on the side of your surgical incision.  Otherwise, you may continue to increase activities without any particular limitations.  Increase the intensity and duration of physical activity gradually.

## 2020-01-01 NOTE — Progress Notes (Addendum)
HPI:  Patient returns for routine postoperative follow-up having undergone a prosthetic aortic valve replacement by Dr. Cornelius Moras on 12/05/2019. Since hospital discharge the patient reports, he has been walking about 2 -3 miles several days per week. He states he feels so much better since the surgery. He denies chest pain, shortness of breath, or signs of active bleeding (as is on Coumadin).   Current Outpatient Medications  Medication Sig Dispense Refill  . aspirin EC 81 MG tablet Take 81 mg by mouth every morning.     Marland Kitchen atorvastatin (LIPITOR) 40 MG tablet Take 1 tablet (40 mg total) by mouth daily. (Patient taking differently: Take 40 mg by mouth every morning. ) 90 tablet 1  . Insulin Glargine (LANTUS SOLOSTAR) 100 UNIT/ML Solostar Pen Inject 40 Units into the skin at bedtime. 15 mL   . lisinopril (ZESTRIL) 20 MG tablet Take 1 tablet (20 mg total) by mouth daily. 90 tablet 0  . metoprolol tartrate (LOPRESSOR) 25 MG tablet Take 1 tablet (25 mg total) by mouth 2 (two) times daily. 180 tablet 1  . oxyCODONE (OXY IR/ROXICODONE) 5 MG immediate release tablet Take 1 tablet (5 mg total) by mouth every 6 (six) hours as needed for severe pain. 30 tablet 0  . sitaGLIPtin-metformin (JANUMET) 50-500 MG tablet Take 1 tablet by mouth 2 (two) times daily with a meal. 180 tablet 0  . warfarin (COUMADIN) 10 MG tablet Take 1 1/2 tablets daily except 1 tablet on Sundays, Tuesdays and Thursdays or as directed 45 tablet 3  Vital Signs: BP 104/66, HR 63, RR 20, Oxygenation 98% on room air   Physical Exam: CV-RRR, sharp valve click, no murmur Pulmonary-Clear to auscultation bilaterally Extremities-No LE edema Wound-Clean and dry. A few eschars on sternal and chest tube wound but no sign of infection.  Diagnostic Tests: CLINICAL DATA:  AVR.  EXAM: CHEST - 2 VIEW  COMPARISON:  12/08/2019.  FINDINGS: Mediastinum and hilar structures normal. Cardiac valve replacement. Cardiomegaly. No pulmonary venous  congestion. No focal infiltrate. No pleural effusion or pneumothorax.  IMPRESSION: 1. Cardiac valve replacement. Cardiomegaly. No pulmonary venous congestion.  2.  No acute pulmonary disease.   Electronically Signed   By: Maisie Fus  Register   On: 01/01/2020 12:42  Impression and Plan: Overall, Mr. Rawlins is recovering well from prosthetic aortic valve replacement. He had INR checked yesterday 04/05 and it was 2.5. He has already seen [place] PA-C with cardiology last week There were no changes made to his medications. Of note, his SBP is in the low 100's and HR in the 60's. He has no symptoms so no need to decrease medications at this time, but will continue to monitor. He states his sugars have been well controlled since discharge. He has a follow up with his medical doctor in a few weeks. He has been purposely losing weight and now that he is able to be more active, I have encouraged him to continue to do so. We discussed once he is not taking a narcotic for pain, he may begin driving short distances (30 minutes or less and gradually increase his frequency and duration as tolerates). He wants to participate in cardiac rehab so we will make a referral to [facility] as is closer to his home. We talked about endocarditis prophylaxis;however, he states most of his teeth were pulled prior to surgery and he will need to get dentures in time. Patient will return to see Dr. Cornelius Moras in about 4 weeks. Also, I have sent  a message to Dr. Bronson Ing about arranging for a post op echo to be done about 6 weeks after Mr. Ackert' surgery.     Nani Skillern, PA-C [facility] [phone]

## 2020-01-01 NOTE — Telephone Encounter (Signed)
Order placed pt notified

## 2020-01-02 ENCOUNTER — Other Ambulatory Visit: Payer: Self-pay | Admitting: Physician Assistant

## 2020-01-02 ENCOUNTER — Other Ambulatory Visit: Payer: Self-pay

## 2020-01-02 ENCOUNTER — Telehealth: Payer: Self-pay

## 2020-01-02 DIAGNOSIS — Z952 Presence of prosthetic heart valve: Secondary | ICD-10-CM

## 2020-01-02 DIAGNOSIS — E785 Hyperlipidemia, unspecified: Secondary | ICD-10-CM

## 2020-01-02 DIAGNOSIS — E1165 Type 2 diabetes mellitus with hyperglycemia: Secondary | ICD-10-CM

## 2020-01-02 DIAGNOSIS — IMO0002 Reserved for concepts with insufficient information to code with codable children: Secondary | ICD-10-CM

## 2020-01-02 NOTE — Telephone Encounter (Signed)
-----   Message from Laqueta Linden, MD sent at 01/02/2020  1:02 PM EDT ----- Regarding: FW: Post op echocardiogram Please arrange for an echocardiogram at [facility] the week of April 26. ----- Message ----- From: Ardelle Balls, PA-C Sent: 01/02/2020   8:12 AM EDT To: Laqueta Linden, MD Subject: Post op echocardiogram                         Hi Dr. Purvis Sheffield. I am Doree Fudge PA-C with Dr. Cornelius Moras and I saw Mr. Hoel in routine follow up s/p prosthetic AVR on 12/05/2019 yesterday. Would you arrange for a follow up post op echo for about 6 weeks from his surgery date and have a structural heart cardiologist read it. Dr. Cornelius Moras would like all of his valve patients to have post op echo's about 6 weeks after surgery. Thank you and have a nice day.

## 2020-01-02 NOTE — Telephone Encounter (Signed)
Order placed, will forward to Ruthine Dose and Chrys Racer to schedule the week of 01/21/23

## 2020-01-03 ENCOUNTER — Ambulatory Visit: Payer: MEDICAID | Attending: Internal Medicine

## 2020-01-03 DIAGNOSIS — Z23 Encounter for immunization: Secondary | ICD-10-CM

## 2020-01-03 NOTE — Progress Notes (Signed)
   Covid-19 Vaccination Clinic  Name:  DEMARYIUS IMRAN    MRN: [id number] DOB: 1976-01-09  01/03/2020  Mr. Crispo was observed post Covid-19 immunization for 15 minutes without incident. He was provided with Vaccine Information Sheet and instruction to access the V-Safe system.   Mr. Biello was instructed to call 911 with any severe reactions post vaccine: Marland Kitchen Difficulty breathing  . Swelling of face and throat  . A fast heartbeat  . A bad rash all over body  . Dizziness and weakness   Immunizations Administered    Name Date Dose VIS Date Route   Moderna COVID-19 Vaccine 01/03/2020 10:04 AM 0.5 mL 08/28/2019 Intramuscular   Manufacturer: Moderna   Lot: [id number]   NDC: 89791-504-13

## 2020-01-14 ENCOUNTER — Ambulatory Visit (INDEPENDENT_AMBULATORY_CARE_PROVIDER_SITE_OTHER): Payer: Self-pay | Admitting: *Deleted

## 2020-01-14 ENCOUNTER — Other Ambulatory Visit: Payer: Self-pay

## 2020-01-14 DIAGNOSIS — Z952 Presence of prosthetic heart valve: Secondary | ICD-10-CM

## 2020-01-14 DIAGNOSIS — Z5181 Encounter for therapeutic drug level monitoring: Secondary | ICD-10-CM

## 2020-01-14 LAB — POCT INR: INR: 3.2 — AB (ref 2.0–3.0)

## 2020-01-14 NOTE — Patient Instructions (Signed)
Decrease dose to 1 tablet daily except 1 1/2 tablets on Wednesdays and Saturdays Recheck on Monday 01/28/20

## 2020-01-17 ENCOUNTER — Other Ambulatory Visit (HOSPITAL_COMMUNITY)
Admission: RE | Admit: 2020-01-17 | Discharge: 2020-01-17 | Disposition: A | Discharge status: Home / Self Care | Payer: Self-pay | Source: Ambulatory Visit | Attending: Physician Assistant | Admitting: Physician Assistant

## 2020-01-17 ENCOUNTER — Other Ambulatory Visit: Payer: Self-pay

## 2020-01-17 DIAGNOSIS — E1165 Type 2 diabetes mellitus with hyperglycemia: Secondary | ICD-10-CM | POA: Insufficient documentation

## 2020-01-17 DIAGNOSIS — IMO0002 Reserved for concepts with insufficient information to code with codable children: Secondary | ICD-10-CM

## 2020-01-17 DIAGNOSIS — E785 Hyperlipidemia, unspecified: Secondary | ICD-10-CM | POA: Insufficient documentation

## 2020-01-17 DIAGNOSIS — E118 Type 2 diabetes mellitus with unspecified complications: Secondary | ICD-10-CM | POA: Insufficient documentation

## 2020-01-17 LAB — LIPID PANEL
Cholesterol: 137 mg/dL (ref 0–200)
HDL: 34 mg/dL — ABNORMAL LOW (ref >40)
LDL Cholesterol: 79 mg/dL (ref 0–99)
Total CHOL/HDL Ratio: 4 RATIO
Triglycerides: 121 mg/dL (ref <150)
VLDL: 24 mg/dL (ref 0–40)

## 2020-01-21 ENCOUNTER — Encounter: Payer: Self-pay | Admitting: Physician Assistant

## 2020-01-21 ENCOUNTER — Ambulatory Visit: Payer: Self-pay | Admitting: Physician Assistant

## 2020-01-21 VITALS — BP 110/64

## 2020-01-21 DIAGNOSIS — Z7901 Long term (current) use of anticoagulants: Secondary | ICD-10-CM

## 2020-01-21 DIAGNOSIS — Z952 Presence of prosthetic heart valve: Secondary | ICD-10-CM

## 2020-01-21 DIAGNOSIS — E1169 Type 2 diabetes mellitus with other specified complication: Secondary | ICD-10-CM

## 2020-01-21 DIAGNOSIS — E785 Hyperlipidemia, unspecified: Secondary | ICD-10-CM

## 2020-01-21 DIAGNOSIS — I10 Essential (primary) hypertension: Secondary | ICD-10-CM

## 2020-01-21 NOTE — Progress Notes (Signed)
BP 110/64    Subjective:    Patient ID: Adam Long, male    DOB: 05-16-1976, 44 y.o.   MRN: [id number]  HPI: Adam Long is a 44 y.o. male presenting on 01/21/2020 for No chief complaint on file.   HPI   This is a telemedicine appointment through [employer] due to coronavirus pandemic.   I connected with  Adam Long on 01/21/20 by a video enabled telemedicine application and verified that I am speaking with the correct person using two identifiers.   I discussed the limitations of evaluation and management by telemedicine. The patient expressed understanding and agreed to proceed.   Pt is a pleasant 43yoM with DM, morbid obesity, dyslipidemia, who recently underwent aortic valve replacement.  He says his breathing is so much better now and he is feeling better.  He is now on coumadin and says he is doing okay with that.    Pt got his first covid vaccination shot and has an appointment for his second  He is up walking around at least 2 miles/day.  No sob or cp.      Relevant past medical, surgical, family and social history reviewed and updated as indicated. Interim medical history since our last visit reviewed. Allergies and medications reviewed and updated.   Current Outpatient Medications:  .  aspirin EC 81 MG tablet, Take 81 mg by mouth every morning. , Disp: , Rfl:  .  atorvastatin (LIPITOR) 40 MG tablet, Take 1 tablet (40 mg total) by mouth daily. (Patient taking differently: Take 40 mg by mouth every morning. ), Disp: 90 tablet, Rfl: 1 .  Insulin Glargine (LANTUS SOLOSTAR) 100 UNIT/ML Solostar Pen, Inject 40 Units into the skin at bedtime., Disp: 15 mL, Rfl:  .  lisinopril (ZESTRIL) 20 MG tablet, Take 1 tablet (20 mg total) by mouth daily., Disp: 90 tablet, Rfl: 0 .  metoprolol tartrate (LOPRESSOR) 25 MG tablet, Take 1 tablet (25 mg total) by mouth 2 (two) times daily., Disp: 180 tablet, Rfl: 1 .  sitaGLIPtin-metformin (JANUMET) 50-500 MG tablet, Take 1 tablet by  mouth 2 (two) times daily with a meal., Disp: 180 tablet, Rfl: 0 .  warfarin (COUMADIN) 10 MG tablet, Take 1 1/2 tablets daily except 1 tablet on Sundays, Tuesdays and Thursdays or as directed, Disp: 45 tablet, Rfl: 3 .  oxyCODONE (OXY IR/ROXICODONE) 5 MG immediate release tablet, Take 1 tablet (5 mg total) by mouth every 6 (six) hours as needed for severe pain. (Patient not taking: Reported on 01/21/2020), Disp: 30 tablet, Rfl: 0     Review of Systems  Per HPI unless specifically indicated above     Objective:    BP 110/64   Wt Readings from Last 3 Encounters:  01/01/20 (!) 369 lb 8 oz (167.6 kg)  12/28/19 (!) 372 lb (168.7 kg)  12/12/19 (!) 370 lb 12.8 oz (168.2 kg)    Physical Exam Constitutional:      General: He is not in acute distress.    Appearance: He is obese. He is not ill-appearing.  HENT:     Head: Normocephalic and atraumatic.  Pulmonary:     Effort: Pulmonary effort is normal. No respiratory distress.  Neurological:     Mental Status: He is alert and oriented to person, place, and time.  Psychiatric:        Attention and Perception: Attention normal.        Mood and Affect: Mood normal.  Speech: Speech normal.        Behavior: Behavior is cooperative.     Results for orders placed or performed during the hospital encounter of 01/17/20  Lipid panel  Result Value Ref Range   Cholesterol 137 0 - 200 mg/dL   Triglycerides 121 <150 mg/dL   HDL 34 (L) >40 mg/dL   Total CHOL/HDL Ratio 4.0 RATIO   VLDL 24 0 - 40 mg/dL   LDL Cholesterol 79 0 - 99 mg/dL      Assessment & Plan:    Encounter Diagnoses  Name Primary?  . Hyperlipidemia, unspecified hyperlipidemia type Yes  . Morbid obesity, unspecified obesity type ([place])   . Essential hypertension, benign   . S/P aortic valve replacement   . Anticoagulated   . Type 2 diabetes mellitus with other specified complication, unspecified whether long term insulin use ([place])      -Reviewed labs with pt -pt  to Continue with specialists per their recommendations -no changes to medications today -pt to continue to gradually increase walking/exercise -follow up 3 months.  RTO sooner prn

## 2020-01-22 ENCOUNTER — Ambulatory Visit (HOSPITAL_COMMUNITY)
Admission: RE | Admit: 2020-01-22 | Discharge: 2020-01-22 | Disposition: A | Discharge status: Home / Self Care | Payer: Self-pay | Source: Ambulatory Visit | Attending: Cardiovascular Disease | Admitting: Cardiovascular Disease

## 2020-01-22 ENCOUNTER — Other Ambulatory Visit: Payer: Self-pay

## 2020-01-22 DIAGNOSIS — Z952 Presence of prosthetic heart valve: Secondary | ICD-10-CM | POA: Insufficient documentation

## 2020-01-22 NOTE — Progress Notes (Signed)
*  PRELIMINARY RESULTS* Echocardiogram 2D Echocardiogram has been performed.  Stacey Drain 01/22/2020, 12:39 PM

## 2020-01-24 ENCOUNTER — Encounter: Payer: Self-pay | Admitting: "Endocrinology

## 2020-01-24 ENCOUNTER — Ambulatory Visit (INDEPENDENT_AMBULATORY_CARE_PROVIDER_SITE_OTHER): Payer: Self-pay | Admitting: "Endocrinology

## 2020-01-24 ENCOUNTER — Encounter: Payer: MEDICAID | Attending: "Endocrinology | Admitting: Nutrition

## 2020-01-24 ENCOUNTER — Other Ambulatory Visit: Payer: Self-pay

## 2020-01-24 ENCOUNTER — Encounter: Payer: Self-pay | Admitting: Nutrition

## 2020-01-24 VITALS — BP 119/77 | HR 72 | Ht 70.0 in | Wt 365.2 lb

## 2020-01-24 DIAGNOSIS — E1165 Type 2 diabetes mellitus with hyperglycemia: Secondary | ICD-10-CM | POA: Insufficient documentation

## 2020-01-24 DIAGNOSIS — IMO0002 Reserved for concepts with insufficient information to code with codable children: Secondary | ICD-10-CM

## 2020-01-24 DIAGNOSIS — I10 Essential (primary) hypertension: Secondary | ICD-10-CM

## 2020-01-24 DIAGNOSIS — E118 Type 2 diabetes mellitus with unspecified complications: Secondary | ICD-10-CM | POA: Insufficient documentation

## 2020-01-24 DIAGNOSIS — E782 Mixed hyperlipidemia: Secondary | ICD-10-CM

## 2020-01-24 LAB — POCT GLYCOSYLATED HEMOGLOBIN (HGB A1C): Hemoglobin A1C: 6.3 % — AB (ref 4.0–5.6)

## 2020-01-24 NOTE — Patient Instructions (Signed)

## 2020-01-24 NOTE — Progress Notes (Signed)
  Medical Nutrition Therapy:  Appt start time:1030 end time:  1100   Assessment:  Primary concerns today: Diabetes Type 2 and Obesity. A1C down to 5.3%.Originally it was 11.5%. To see Dr. Fransico Him today. Will reduce Lantus to 30 units now and keep his Janumet. Lost 25 lbs in the last 3 months..  Walking more; 2 miles per day. Eating more fresh fruits and vegetables.  CMP Latest Ref Rng & Units 12/10/2019 12/09/2019 12/08/2019  Glucose 70 - 99 mg/dL 462(V) 035(K) 093(G)  BUN 6 - 20 mg/dL 9 9 12   Creatinine 0.61 - 1.24 mg/dL 1.82 9.93  Sodium 135 - 145 mmol/L 137 136 134(L)  Potassium 3.5 - 5.1 mmol/L 4.6 3.9 3.5  Chloride 98 - 111 mmol/L 100 98 95(L)  CO2 22 - 32 mmol/L 29 26 28   Calcium 8.9 - 10.3 mg/dL 8.9 7.16) )  Total Protein 6.5 - 8.1 g/dL - - -  Total Bilirubin 0.3 - 1.2 mg/dL - - -  Alkaline Phos 38 - 126 U/L - - -  AST 15 - 41 U/L - - -  ALT 0 - 44 U/L - - -   Lab Results  Component Value Date   HGBA1C 7.2 (H) 12/03/2019   Lipid Panel     Component Value Date/Time   CHOL 137 01/17/2020 0832   TRIG 121 01/17/2020 0832   HDL 34 (L) 01/17/2020 0832   CHOLHDL 4.0 01/17/2020 0832   VLDL 24 01/17/2020 0832   LDLCALC 79 01/17/2020 0832    Preferred Learning Style:   No preference indicated   Learning Readiness:  Ready  Change in progress   MEDICATIONS:    DIETARY INTAKE:  24-hr recall:  B ( AM): Rice chex, banana and milk, egg,  L ( PM):  Chicken breast on salad, water D  Small salad and spaghetti WW noodles. Water, Usual physical activity: walking   Estimated energy needs: 1800 calories 200 g carbohydrates 135 g protein 50 g fat  Progress Towards Goal(s):  In progress.   Nutritional Diagnosis:  NB-1.1 Food and nutrition-related knowledge deficit As related to Diabetes and Obesity.  As evidenced by A1C 11% and BMI > 40.    Intervention:  Nutrition and Diabetes education provided on My Plate, CHO counting, meal planning, portion sizes, timing  of meals, avoiding snacks between meals unless having a low blood sugar, target ranges for A1C and blood sugars, signs/symptoms and treatment of hyper/hypoglycemia, monitoring blood sugars, taking medications as prescribed, benefits of exercising 30 minutes per day and prevention of complications of DM.  Goals Keep up the great job!  Eat meals on time Increase exercise as tolerated. Continue to increase fresh fruits and vegetables Ask MD about  switching to [facility]  Teaching Method Utilized:  Visual Auditory Hands on  Handouts given during visit include:  The Plate Method  Meal Plan  Low Cholesterol Diet  Low Salt     Barriers to learning/adherence to lifestyle change: Heart condition   Demonstrated degree of understanding via:  Teach Back   Monitoring/Evaluation:  Dietary intake, exercise, , and body weight in 3-4 month(s).

## 2020-01-24 NOTE — Progress Notes (Signed)
01/24/2020  Endocrinology follow-up note   Subjective:    Patient ID: Adam Long, male    DOB: 12/15/1975. Patient is being seen in follow-up for management of currently uncontrolled, complicated type 2 diabetes . PCP:   Jacquelin Hawking, PA-C   Past Medical History:  Diagnosis Date  . Anxiety   . Aortic stenosis   . Cholelithiasis   . Coronary artery disease    non-obstructive CAD (40-50% LAD) 08/2019  . Depression   . Diabetes mellitus   . Dyspnea    per pt upon exertion  . Heart murmur    per pt 10/24/19 dx in 06/2019  . Hypercholesteremia   . Hypertension   . Morbid obesity (HCC)   . S/P aortic valve replacement with mechanical valve 12/05/2019   25 mm Carbomedics top hat bileaflet mechanical valve via partial upper hemi-sternotomy   Past Surgical History:  Procedure Laterality Date  . ABDOMINAL AORTOGRAM W/LOWER EXTREMITY N/A 07/06/2019   Procedure: ABDOMINAL AORTOGRAM W/LOWER EXTREMITY;  Surgeon: Sherren Kerns, MD;  Location: [facility] INVASIVE CV LAB;  Service: Cardiovascular;  Laterality: N/A;  Bilateral  . AMPUTATION Left 07/09/2019   Procedure: LEFT FOURTH and Fifth TOE AMPUTATION.;  Surgeon: Larina Earthly, MD;  Location: [facility] OR;  Service: Vascular;  Laterality: Left;  . AORTIC VALVE REPLACEMENT N/A 12/05/2019   Procedure: PARTIAL STERNOTOMY FOR AORTIC VALVE REPLACEMENT (AVR), USING CARBOMEDICS SUPRA-ANNULAR TOP HAT ;  Surgeon: Purcell Nails, MD;  Location: [facility] OR;  Service: Open Heart Surgery;  Laterality: N/A;  No neck lines on left  . IR RADIOLOGY PERIPHERAL GUIDED IV START  10/05/2019  . IR US GUIDE VASC ACCESS RIGHT  10/05/2019  . MULTIPLE EXTRACTIONS WITH ALVEOLOPLASTY N/A 10/26/2019   Procedure: EXTRACTION OF TOOTH #'S 3, 5-11,19-28,  AND 32 WITH ALVEOLOPLASTY;  Surgeon: Charlynne Pander, DDS;  Location: [facility] OR;  Service: Oral Surgery;  Laterality: N/A;  . RIGHT HEART CATH AND CORONARY ANGIOGRAPHY N/A 09/24/2019   Procedure: RIGHT HEART CATH AND CORONARY  ANGIOGRAPHY;  Surgeon: Tonny Bollman, MD;  Location: [facility] INVASIVE CV LAB;  Service: Cardiovascular;  Laterality: N/A;  . TEE WITHOUT CARDIOVERSION N/A 12/05/2019   Procedure: TRANSESOPHAGEAL ECHOCARDIOGRAM (TEE);  Surgeon: Purcell Nails, MD;  Location: [facility] OR;  Service: Open Heart Surgery;  Laterality: N/A;   Social History   Socioeconomic History  . Marital status: Single    Spouse name: Not on file  . Number of children: Not on file  . Years of education: Not on file  . Highest education level: Not on file  Occupational History  . Not on file  Tobacco Use  . Smoking status: Never Smoker  . Smokeless tobacco: Former Neurosurgeon    Types: Chew  Substance and Sexual Activity  . Alcohol use: Not Currently    Comment: occasionally  . Drug use: No  . Sexual activity: Not on file  Other Topics Concern  . Not on file  Social History Narrative  . Not on file   Social Determinants of Health   Financial Resource Strain:   . Difficulty of Paying Living Expenses:   Food Insecurity:   . Worried About Programme researcher, broadcasting/film/video in the Last Year:   . Barista in the Last Year:   Transportation Needs:   . Freight forwarder (Medical):   Marland Kitchen Lack of Transportation (Non-Medical):   Physical Activity:   . Days of Exercise per Week:   . Minutes of Exercise per Session:  Stress:   . Feeling of Stress :   Social Connections:   . Frequency of Communication with Friends and Family:   . Frequency of Social Gatherings with Friends and Family:   . Attends Religious Services:   . Active Member of Clubs or Organizations:   . Attends BankerClub or Organization Meetings:   Marland Kitchen. Marital Status:    Outpatient Encounter Medications as of 01/24/2020  Medication Sig  . aspirin EC 81 MG tablet Take 81 mg by mouth every morning.   Marland Kitchen. atorvastatin (LIPITOR) 40 MG tablet Take 1 tablet (40 mg total) by mouth daily. (Patient taking differently: Take 40 mg by mouth every morning. )  . Insulin Glargine (LANTUS  SOLOSTAR) 100 UNIT/ML Solostar Pen Inject 40 Units into the skin at bedtime.  Marland Kitchen. lisinopril (ZESTRIL) 20 MG tablet Take 1 tablet (20 mg total) by mouth daily.  . metoprolol tartrate (LOPRESSOR) 25 MG tablet Take 1 tablet (25 mg total) by mouth 2 (two) times daily.  Marland Kitchen. oxyCODONE (OXY IR/ROXICODONE) 5 MG immediate release tablet Take 1 tablet (5 mg total) by mouth every 6 (six) hours as needed for severe pain. (Patient not taking: Reported on 01/21/2020)  . sitaGLIPtin-metformin (JANUMET) 50-500 MG tablet Take 1 tablet by mouth 2 (two) times daily with a meal.  . warfarin (COUMADIN) 10 MG tablet Take 1 1/2 tablets daily except 1 tablet on Sundays, Tuesdays and Thursdays or as directed   No facility-administered encounter medications on file as of 01/24/2020.   ALLERGIES: Allergies  Allergen Reactions  . Pollen Extract Other (See Comments)    Runny nose, watery eyes, sneezing   VACCINATION STATUS: Immunization History  Administered Date(s) Administered  . Influenza Split 07/01/2015, 06/30/2016  . Influenza,inj,Quad PF,6+ Mos 06/30/2017, 07/30/2018, 06/12/2019  . Moderna SARS-COVID-2 Vaccination 01/03/2020  . Tdap 06/11/2019    Diabetes He presents for his follow-up diabetic visit. He has type 2 diabetes mellitus. Onset time: He was diagnosed at approximate age of 35 years. His disease course has been improving (After he disappeared from care, he recently returns with A1c of 11.5% .). There are no hypoglycemic associated symptoms. Pertinent negatives for hypoglycemia include no confusion, headaches, pallor or seizures. Pertinent negatives for diabetes include no blurred vision, no chest pain, no fatigue, no polydipsia, no polyphagia, no polyuria and no weakness. There are no hypoglycemic complications. Symptoms are improving. Diabetic complications include heart disease and PVD. (Peripheral artery disease complicated by partial amputation of left foot. Since his last visit, he underwent aortic  valve replacement.) Risk factors for coronary artery disease include diabetes mellitus, dyslipidemia, family history, obesity, male sex, hypertension and sedentary lifestyle. Current diabetic treatment includes oral agent (dual therapy) and insulin injections. His weight is decreasing steadily. He is following a generally unhealthy diet. When asked about meal planning, he reported none. He has not had a previous visit with a dietitian. He never participates in exercise. His home blood glucose trend is decreasing steadily. His breakfast blood glucose range is generally 140-180 mg/dl. His bedtime blood glucose range is generally 140-180 mg/dl. His overall blood glucose range is 140-180 mg/dl. (He came with significantly improved glycemic profile, a1c of 7.4%. ) An ACE inhibitor/angiotensin II receptor blocker is being taken. Eye exam is current.  Hyperlipidemia This is a chronic problem. The current episode started more than 1 year ago. The problem is uncontrolled. Recent lipid tests were reviewed and are high. Exacerbating diseases include diabetes and obesity. Pertinent negatives include no chest pain, myalgias or shortness of  breath. Current antihyperlipidemic treatment includes statins. Risk factors for coronary artery disease include diabetes mellitus, dyslipidemia, hypertension, male sex, obesity, a sedentary lifestyle and family history.  Hypertension This is a chronic problem. The current episode started more than 1 year ago. The problem is controlled. Pertinent negatives include no blurred vision, chest pain, headaches, neck pain, palpitations or shortness of breath. Risk factors for coronary artery disease include diabetes mellitus, dyslipidemia, male gender, obesity and sedentary lifestyle. Past treatments include angiotensin blockers. Hypertensive end-organ damage includes PVD.     Review of Systems  Constitutional: Negative for chills, fatigue, fever and unexpected weight change.  HENT:  Negative for dental problem, mouth sores and trouble swallowing.   Eyes: Negative for blurred vision and visual disturbance.  Respiratory: Negative for cough, choking, chest tightness, shortness of breath and wheezing.   Cardiovascular: Negative for chest pain, palpitations and leg swelling.  Gastrointestinal: Negative for abdominal distention, abdominal pain, constipation, diarrhea, nausea and vomiting.  Endocrine: Negative for polydipsia, polyphagia and polyuria.  Genitourinary: Negative for dysuria, flank pain, hematuria and urgency.  Musculoskeletal: Negative for back pain, gait problem, myalgias and neck pain.  Skin: Negative for pallor, rash and wound.  Neurological: Negative for seizures, syncope, weakness, numbness and headaches.  Psychiatric/Behavioral: Negative.  Negative for confusion and dysphoric mood.    Objective:    BP 119/77   Pulse 72   Ht 5\' 10"  (1.778 m)   Wt (!) 365 lb 3.2 oz (165.7 kg)   BMI 52.40 kg/m   Wt Readings from Last 3 Encounters:  01/24/20 (!) 365 lb (165.6 kg)  01/24/20 (!) 365 lb 3.2 oz (165.7 kg)  01/01/20 (!) 369 lb 8 oz (167.6 kg)    Physical Exam  Constitutional: He is oriented to person, place, and time. He is cooperative. No distress.  Morbidly obese, in no acute distress  HENT:  Head: Normocephalic and atraumatic.  Eyes: EOM are normal.  Neck: No tracheal deviation present. No thyromegaly present.  Cardiovascular: Normal rate, S1 normal and S2 normal. Exam reveals no gallop.  No murmur heard. Pulses:      Dorsalis pedis pulses are 1+ on the right side and 1+ on the left side.       Posterior tibial pulses are 1+ on the right side and 1+ on the left side.  Pulmonary/Chest: Effort normal. No respiratory distress. He has no wheezes.  Abdominal: He exhibits no distension. There is no abdominal tenderness. There is no guarding and no CVA tenderness.  Musculoskeletal:        General: No edema.     Right shoulder: No swelling or deformity.      Cervical back: Normal range of motion and neck supple.  Neurological: He is alert and oriented to person, place, and time. He has normal strength. No cranial nerve deficit or sensory deficit. Gait normal.  Skin: Skin is warm and dry. No rash noted. No cyanosis. Nails show no clubbing.  Psychiatric: He has a normal mood and affect. His speech is normal. Cognition and memory are normal.   CMP ( most recent) CMP     Component Value Date/Time   NA 137 12/10/2019 0300   NA 134 09/10/2019 1231   K 4.6 12/10/2019 0300   CL 100 12/10/2019 0300   CO2 29 12/10/2019 0300   GLUCOSE 127 (H) 12/10/2019 0300   BUN 9 12/10/2019 0300   BUN 12 09/10/2019 1231   CREATININE 0.80 12/10/2019 0300   CREATININE 0.77 01/07/2017 1040  CALCIUM 8.9 12/10/2019 0300   PROT 7.4 12/03/2019 1132   ALBUMIN 3.7 12/03/2019 1132   AST 23 12/03/2019 1132   ALT 27 12/03/2019 1132   ALKPHOS 49 12/03/2019 1132   BILITOT 1.2 12/03/2019 1132   GFRNONAA >60 12/10/2019 0300   GFRNONAA >89 04/08/2016 0903   GFRAA >60 12/10/2019 0300   GFRAA >89 04/08/2016 0903     Diabetic Labs (most recent): Lab Results  Component Value Date   HGBA1C 6.3 (A) 01/24/2020   HGBA1C 7.2 (H) 12/03/2019   HGBA1C 7.4 (H) 10/24/2019     Lipid Panel ( most recent) Lipid Panel     Component Value Date/Time   CHOL 137 01/17/2020 0832   TRIG 121 01/17/2020 0832   HDL 34 (L) 01/17/2020 0832   CHOLHDL 4.0 01/17/2020 0832   VLDL 24 01/17/2020 0832   LDLCALC 79 01/17/2020 0832      Assessment & Plan:   1. Uncontrolled type 2 diabetes mellitus with complication, unspecified whether long term insulin use (HCC)  - Patient has currently uncontrolled symptomatic type 2 DM since  44 years of age. -He returns with significantly improved glycemic profile both fasting and postprandial to target levels.  His point-of-care A1c is 6.3%, overall improving from 11.5%.  No hypoglycemia.  He has better mobility after his aortic valve  replacement. -He is recent labs are reviewed with him.    His diabetes is recently complicated by peripheral arterial disease with partial amputation of left foot, obesity/sedentary life, noncompliance/nonadherence, lack of proper insurance for medications and doctors visits and patient remains at a high risk for more acute and chronic complications of diabetes which include CAD, CVA, CKD, retinopathy, and neuropathy. These are all discussed in detail with the patient.  - I have counseled the patient on diet management and weight loss, by adopting a carbohydrate restricted/protein rich diet.  - he  admits there is a room for improvement in his diet and drink choices. -  Suggestion is made for him to avoid simple carbohydrates  from his diet including Cakes, Sweet Desserts / Pastries, Ice Cream, Soda (diet and regular), Sweet Tea, Candies, Chips, Cookies, Sweet Pastries,  Store Bought Juices, Alcohol in Excess of  1-2 drinks a day, Artificial Sweeteners, Coffee Creamer, and "Sugar-free" Products. This will help patient to have stable blood glucose profile and potentially avoid unintended weight gain.  - I encouraged the patient to switch to  unprocessed or minimally processed complex starch and increased protein intake (animal or plant source), fruits, and vegetables.  - Patient is advised to stick to a routine mealtimes to eat 3 meals  a day and avoid unnecessary snacks ( to snack only to correct hypoglycemia).  - The patient has been  scheduled with Norm Salt, RDN, CDE for individualized DM education.  - I have approached patient with the following individualized plan to manage diabetes and patient agrees:   -Given his significant response, he will benefit from simplified treatment options. -  He is advised to lower his Lantus to 30 units nightly, continue to hold NovoLog for now.  He will monitor blood glucose twice a day-daily before breakfast and at bedtime.    -He is warned not to take  insulin without proper monitoring of blood glucose per orders.    -Adjustment parameters are given for hypo and hyperglycemia in writing. -Patient is encouraged to call clinic for blood glucose levels less than 70 or above 300 mg /dl. - He is advised to continue Janumet  50/500 mg p.o. 2 times daily after supper and after breakfast and after supper as long as he tolerates.   - Patient specific target  A1c;  LDL, HDL, Triglycerides, were discussed in detail.  2) BP/HTN: His blood pressure is controlled to target.    He is advised to continue metoprolol 50 mg p.o. daily, lisinopril 20 mg p.o. daily.   3) Lipids/HPL:   His recent lipid panel showed controlled LDL at 98 .  He is advised to continue atorvastatin 20 mg p.o. nightly.        Side effects and precautions discussed with him.   4)  Weight/Diet: His BMI is 52.3-clearly complicating his diabetes care.  He lost 25 pounds since last visit.  He is still  a candidate for major weight loss.  I discussed with him the fact that loss of 5-10 percent of his body weight will impact his diabetes significantly.  He is a candidate for bariatric surgery. We will explore this option later.  CDE Consult has been initiated;  exercise, and detailed carbohydrates information provided.  5) Chronic Care/Health Maintenance:  -Patient is on ACEI/ARB and Statin medications and encouraged to continue to follow up with Ophthalmology, Podiatrist at least yearly or according to recommendations, and advised to   stay away from smoking. I have recommended yearly flu vaccine and pneumonia vaccination at least every 5 years; moderate intensity exercise for up to 150 minutes weekly; and  sleep for at least 7 hours a day.  - I advised patient to maintain close follow up with Jacquelin Hawking, PA-C for primary care needs.  - Time spent on this patient care encounter:  35 min, of which > 50% was spent in  counseling and the rest reviewing his blood glucose logs , discussing his  hypoglycemia and hyperglycemia episodes, reviewing his current and  previous labs / studies  ( including abstraction from other facilities) and medications  doses and developing a  long term treatment plan and documenting his care.   Please refer to Patient Instructions for Blood Glucose Monitoring and Insulin/Medications Dosing Guide"  in media tab for additional information. Please  also refer to " Patient Self Inventory" in the Media  tab for reviewed elements of pertinent patient history.  [name] participated in the discussions, expressed understanding, and voiced agreement with the above plans.  All questions were answered to his satisfaction. he is encouraged to contact clinic should he have any questions or concerns prior to his return visit.  Follow up plan: - Return in about 4 months (around 05/25/2020) for Bring Meter and Logs- A1c in Office, Follow up with Pre-visit Labs.  Marquis Lunch, MD Phone: [phone]  Fax: [phone]   01/24/2020, 12:24 PM

## 2020-01-24 NOTE — Patient Instructions (Addendum)
  Goals Keep up the great job!  Eat meals on time Increase exercise as tolerated. Continue to increase fresh fruits and vegetables Ask MD about  switching to [employer]

## 2020-01-28 ENCOUNTER — Ambulatory Visit (INDEPENDENT_AMBULATORY_CARE_PROVIDER_SITE_OTHER): Payer: Self-pay | Admitting: *Deleted

## 2020-01-28 ENCOUNTER — Other Ambulatory Visit: Payer: Self-pay

## 2020-01-28 ENCOUNTER — Encounter: Payer: Self-pay | Admitting: Nutrition

## 2020-01-28 ENCOUNTER — Telehealth (HOSPITAL_COMMUNITY): Payer: Self-pay | Admitting: *Deleted

## 2020-01-28 DIAGNOSIS — Z952 Presence of prosthetic heart valve: Secondary | ICD-10-CM

## 2020-01-28 DIAGNOSIS — Z5181 Encounter for therapeutic drug level monitoring: Secondary | ICD-10-CM

## 2020-01-28 LAB — POCT INR: INR: 2.1 (ref 2.0–3.0)

## 2020-01-28 NOTE — Patient Instructions (Signed)
Continue warfarin 1 tablet daily except 1 1/2 tablets on Wednesdays and Saturdays Recheck in 3 wks/lr

## 2020-01-28 NOTE — Telephone Encounter (Signed)
Called patient about his referral. Discussed his insurance situation. He said he would call us back once his insurance is activated.  Adam Long

## 2020-02-04 ENCOUNTER — Telehealth: Payer: Self-pay | Admitting: Thoracic Surgery (Cardiothoracic Vascular Surgery)

## 2020-02-04 ENCOUNTER — Other Ambulatory Visit: Payer: Self-pay

## 2020-02-04 ENCOUNTER — Encounter: Payer: Self-pay | Admitting: Thoracic Surgery (Cardiothoracic Vascular Surgery)

## 2020-02-04 ENCOUNTER — Telehealth (INDEPENDENT_AMBULATORY_CARE_PROVIDER_SITE_OTHER): Payer: Self-pay | Admitting: Thoracic Surgery (Cardiothoracic Vascular Surgery)

## 2020-02-04 DIAGNOSIS — Z954 Presence of other heart-valve replacement: Secondary | ICD-10-CM

## 2020-02-04 NOTE — Progress Notes (Signed)
[address]       [name] [address]             [phone]     CARDIOTHORACIC SURGERY TELEPHONE VIRTUAL OFFICE NOTE  Primary Cardiologist is Prentice Docker, MD PCP is Jacquelin Hawking, PA-C   HPI:  I spoke with [name] (DOB 08-23-1976 ) via telephone on 02/04/2020 at 2:15 PM and verified that I was speaking with the correct person using more than one form of identification.  We discussed the reason(s) for conducting our visit virtually instead of in-person.  The patient expressed understanding the circumstances and agreed to proceed as described.   Patient is a 44 year old morbidly obese male who underwent aortic valve replacement using mechanical prosthetic valve via partial upper mini sternotomy on December 05, 2019.  His postoperative recovery has been uneventful and he was last seen here in our office on January 01, 2020 at which time he was doing well.  Since then he underwent routine follow-up transthoracic echocardiogram on January 22, 2020 which revealed normal functioning bileaflet mechanical valve in the aortic position and normal left ventricular systolic function.  Mean transvalvular gradient across the valve was estimated 16 mmHg.  I spoke with the patient over the telephone today to see how he is recovering.  He states that he is doing very well overall.  He no longer has any significant pain in his chest.  He is doing fairly well with management of Coumadin.  He states that his breathing is much better than it was prior to surgery.  He also has managed to lose a little bit of weight.  His blood sugars have been under very good control and his endocrinologist recently cut back his dose of Lantus insulin.  Overall the patient is quite pleased with his progress and he is glad that he went through his surgery.   Current Outpatient Medications  Medication Sig Dispense Refill  . aspirin EC 81 MG tablet Take 81 mg by mouth every morning.     Marland Kitchen  atorvastatin (LIPITOR) 40 MG tablet Take 1 tablet (40 mg total) by mouth daily. (Patient taking differently: Take 40 mg by mouth every morning. ) 90 tablet 1  . Insulin Glargine (LANTUS SOLOSTAR) 100 UNIT/ML Solostar Pen Inject 40 Units into the skin at bedtime. 15 mL   . lisinopril (ZESTRIL) 20 MG tablet Take 1 tablet (20 mg total) by mouth daily. 90 tablet 0  . metoprolol tartrate (LOPRESSOR) 25 MG tablet Take 1 tablet (25 mg total) by mouth 2 (two) times daily. 180 tablet 1  . oxyCODONE (OXY IR/ROXICODONE) 5 MG immediate release tablet Take 1 tablet (5 mg total) by mouth every 6 (six) hours as needed for severe pain. (Patient not taking: Reported on 01/21/2020) 30 tablet 0  . sitaGLIPtin-metformin (JANUMET) 50-500 MG tablet Take 1 tablet by mouth 2 (two) times daily with a meal. 180 tablet 0  . warfarin (COUMADIN) 10 MG tablet Take 1 1/2 tablets daily except 1 tablet on Sundays, Tuesdays and Thursdays or as directed 45 tablet 3   No current facility-administered medications for this visit.     Diagnostic Tests:   ECHOCARDIOGRAM REPORT       Patient Name:  ROGERICK BALDWIN Date of Exam: 01/22/2020  Medical Rec #: [id number]    Height:    70.0 in  Accession #:  [id number]   Weight:    369.5 lb  Date of Birth: 1976/03/15  BSA:     2.709 m  Patient Age:  44 years    BP:      120/66 mmHg  Patient Gender: M        HR:      74 bpm.  Exam Location: [facility]   Procedure: 2D Echo, Cardiac Doppler and Color Doppler   Indications:  Z95.2 (ICD-10-CM) - S/P AVR    History:    Patient has prior history of Echocardiogram examinations,  most         recent 06/12/2019. CAD; Risk Factors:Hypertension, Diabetes  and         Dyslipidemia. Morbid obesity,S/P aortic valve replacement  with         mechanical valve-25 mm Carbomedics top hat bileaflet  mechanical         valve via partial upper  hemi-sternotomy.    Sonographer:  Alvino Chapel RCS  Referring Phys: [phone] [place]    1. Left ventricular ejection fraction, by estimation, is 55 to 60%. The  left ventricle has normal function. The left ventricle has no regional  wall motion abnormalities. There is mild left ventricular hypertrophy.  Left ventricular diastolic parameters  are consistent with Grade I diastolic dysfunction (impaired relaxation).  2. Right ventricular systolic function is normal. The right ventricular  size is normal. Tricuspid regurgitation signal is inadequate for assessing  PA pressure.  3. The mitral valve is abnormal, moderately calcified annulus. Trivial  mitral valve regurgitation.  4. The aortic valve has been repaired/replaced. There is a 25 mm  Carbometrics Top Hat bileaflet mechanical prosthesis in position. Aortic  valve regurgitation is trivial. Mean gradient 16 mmHg.  5. The inferior vena cava is normal in size with greater than 50%  respiratory variability, suggesting right atrial pressure of 3 mmHg.   FINDINGS  Left Ventricle: Left ventricular ejection fraction, by estimation, is 55  to 60%. The left ventricle has normal function. The left ventricle has no  regional wall motion abnormalities. The left ventricular internal cavity  size was normal in size. There is  mild left ventricular hypertrophy. Left ventricular diastolic parameters  are consistent with Grade I diastolic dysfunction (impaired relaxation).   Right Ventricle: The right ventricular size is normal. No increase in  right ventricular wall thickness. Right ventricular systolic function is  normal. Tricuspid regurgitation signal is inadequate for assessing PA  pressure.   Left Atrium: Left atrial size was normal in size.   Right Atrium: Right atrial size was normal in size.   Pericardium: A small pericardial effusion is present. The pericardial  effusion is anterior to the right  ventricle. Presence of pericardial fat  pad.   Mitral Valve: The mitral valve is abnormal. Moderate mitral annular  calcification. Trivial mitral valve regurgitation.   Tricuspid Valve: The tricuspid valve is grossly normal. Tricuspid valve  regurgitation is trivial.   Aortic Valve: The aortic valve has been repaired/replaced. Aortic valve  regurgitation is trivial. Aortic valve mean gradient measures 16.0 mmHg.  Aortic valve peak gradient measures 29.9 mmHg. Aortic valve area, by VTI  measures 1.52 cm.   Pulmonic Valve: The pulmonic valve was grossly normal. Pulmonic valve  regurgitation is trivial.   Aorta: The aortic root is normal in size and structure.   Venous: The inferior vena cava is normal in size with greater than 50%  respiratory variability, suggesting right atrial pressure of 3 mmHg.   IAS/Shunts: No atrial level shunt detected by color flow Doppler.  LEFT VENTRICLE  PLAX 2D  LVIDd:     5.65 cm Diastology  LVIDs:     3.61 cm LV e' lateral:  12.40 cm/s  LV PW:     1.08 cm LV E/e' lateral: 10.6  LV IVS:    1.17 cm LV e' medial:  9.57 cm/s  LVOT diam:   2.00 cm LV E/e' medial: 13.8  LV SV:     91  LV SV Index:  34  LVOT Area:   3.14 cm     RIGHT VENTRICLE  RV S prime:   9.25 cm/s  TAPSE (M-mode): 1.6 cm   LEFT ATRIUM       Index  LA diam:    3.70 cm 1.37 cm/m  LA Vol (A2C):  53.1 ml 19.60 ml/m  LA Vol (A4C):  72.2 ml 26.65 ml/m  LA Biplane Vol: 67.7 ml 24.99 ml/m  AORTIC VALVE  AV Area (Vmax):  1.57 cm  AV Area (Vmean):  1.54 cm  AV Area (VTI):   1.52 cm  AV Vmax:      273.50 cm/s  AV Vmean:     188.000 cm/s  AV VTI:      0.600 m  AV Peak Grad:   29.9 mmHg  AV Mean Grad:   16.0 mmHg  LVOT Vmax:     137.00 cm/s  LVOT Vmean:    91.900 cm/s  LVOT VTI:     0.290 m  LVOT/AV VTI ratio: 0.48    AORTA  Ao Root diam: 3.50 cm   MITRAL VALVE  MV Area  (PHT): 2.76 cm   SHUNTS  MV Decel Time: 275 msec   Systemic VTI: 0.29 m  MV E velocity: 132.00 cm/s Systemic Diam: 2.00 cm  MV A velocity: 110.00 cm/s  MV E/A ratio: 1.20   Nona Dell MD  Electronically signed by Nona Dell MD  Signature Date/Time: 01/22/2020/12:52:54 PM       Impression:  Patient is doing well approximately 2 months status post aortic valve replacement using a mechanical prosthetic valve  Plan:  We have not recommended any change the patient's current medications.  I have encouraged the patient to continue to increase his physical activity without any particular limitations.  The patient has been reminded regarding the importance of dental hygiene and the lifelong need for antibiotic prophylaxis for all dental cleanings and other related invasive procedures.  Patient will continue to follow-up periodically with Dr. Purvis Sheffield.  He will return to our office for routine follow-up next March, approximately 1 year following his surgery.  He will call and return sooner should specific problems or questions arise.    I discussed limitations of evaluation and management via telephone.  The patient was advised to call back for repeat telephone consultation or to seek an in-person evaluation if questions arise or the patient's clinical condition changes in any significant manner.     Salvatore Decent. Cornelius Moras, MD 02/04/2020 2:15 PM

## 2020-02-04 NOTE — Patient Instructions (Addendum)

## 2020-02-05 ENCOUNTER — Ambulatory Visit: Payer: Self-pay

## 2020-02-05 ENCOUNTER — Ambulatory Visit: Payer: MEDICAID | Attending: Internal Medicine

## 2020-02-05 DIAGNOSIS — Z23 Encounter for immunization: Secondary | ICD-10-CM

## 2020-02-05 NOTE — Progress Notes (Signed)
   Covid-19 Vaccination Clinic  Name:  GARREN GREENMAN    MRN: [id number] DOB: 07-17-1976  02/05/2020  Mr. Belson was observed post Covid-19 immunization for 15 minutes without incident. He was provided with Vaccine Information Sheet and instruction to access the V-Safe system.   Mr. Koenigs was instructed to call 911 with any severe reactions post vaccine: Marland Kitchen Difficulty breathing  . Swelling of face and throat  . A fast heartbeat  . A bad rash all over body  . Dizziness and weakness   Immunizations Administered    Name Date Dose VIS Date Route   Moderna COVID-19 Vaccine 02/05/2020  9:43 AM 0.5 mL 08/2019 Intramuscular   Manufacturer: Moderna   Lot: [id number]   NDC: 68372-902-11

## 2020-02-18 ENCOUNTER — Ambulatory Visit (INDEPENDENT_AMBULATORY_CARE_PROVIDER_SITE_OTHER): Payer: Self-pay | Admitting: *Deleted

## 2020-02-18 ENCOUNTER — Other Ambulatory Visit: Payer: Self-pay

## 2020-02-18 DIAGNOSIS — Z5181 Encounter for therapeutic drug level monitoring: Secondary | ICD-10-CM

## 2020-02-18 DIAGNOSIS — Z952 Presence of prosthetic heart valve: Secondary | ICD-10-CM

## 2020-02-18 LAB — POCT INR: INR: 2 (ref 2.0–3.0)

## 2020-02-18 NOTE — Patient Instructions (Signed)
Increase warfarin to 1 tablet daily except 1 1/2 tablets on Mondays, Wednesdays and Fridays Recheck in 4 wks

## 2020-03-07 ENCOUNTER — Other Ambulatory Visit: Payer: Self-pay | Admitting: Physician Assistant

## 2020-03-17 ENCOUNTER — Other Ambulatory Visit: Payer: Self-pay

## 2020-03-17 ENCOUNTER — Ambulatory Visit (INDEPENDENT_AMBULATORY_CARE_PROVIDER_SITE_OTHER): Payer: Self-pay | Admitting: *Deleted

## 2020-03-17 DIAGNOSIS — Z952 Presence of prosthetic heart valve: Secondary | ICD-10-CM

## 2020-03-17 DIAGNOSIS — Z5181 Encounter for therapeutic drug level monitoring: Secondary | ICD-10-CM

## 2020-03-17 LAB — POCT INR: INR: 2.1 (ref 2.0–3.0)

## 2020-03-17 NOTE — Patient Instructions (Signed)
Continue warfarin 1 tablet daily except 1 1/2 tablets on Mondays, Wednesdays and Fridays  Recheck in 4 wks 

## 2020-04-01 ENCOUNTER — Other Ambulatory Visit: Payer: Self-pay

## 2020-04-01 ENCOUNTER — Ambulatory Visit (INDEPENDENT_AMBULATORY_CARE_PROVIDER_SITE_OTHER): Payer: Self-pay | Admitting: Cardiology

## 2020-04-01 ENCOUNTER — Encounter: Payer: Self-pay | Admitting: Cardiology

## 2020-04-01 VITALS — BP 106/68 | HR 74 | Ht 70.0 in | Wt 357.0 lb

## 2020-04-01 DIAGNOSIS — E782 Mixed hyperlipidemia: Secondary | ICD-10-CM

## 2020-04-01 DIAGNOSIS — I10 Essential (primary) hypertension: Secondary | ICD-10-CM

## 2020-04-01 DIAGNOSIS — Z952 Presence of prosthetic heart valve: Secondary | ICD-10-CM

## 2020-04-01 DIAGNOSIS — I2510 Atherosclerotic heart disease of native coronary artery without angina pectoris: Secondary | ICD-10-CM

## 2020-04-01 NOTE — Progress Notes (Signed)
Cardiology Office Note  Date: 04/01/2020   ID: Adam Long, DOB 10-05-1975, MRN [id number]  PCP:  Jacquelin Hawking, PA-C  Cardiologist:  Prentice Docker, MD Electrophysiologist:  None   Chief Complaint  Patient presents with  . Cardiac follow-up    History of Present Illness: Adam Long is a 44 y.o. male former patient of Dr. Purvis Sheffield last seen in the office in April by Ms. Strader PA-C.  He presents to establish follow-up with me today.  I reviewed extensive records and updated the chart.  He states that he has been doing well, no breathlessness beyond NYHA class II, no exertional chest pain, palpitations, or syncope.  He has been trying to lose more weight, has also shown significant improvement in his glucose control, most recent hemoglobin A1c was down to 6.3%.  He remains on Coumadin with follow-up in the anticoagulation clinic.  Last INR was 2.1.  He does not report any active bleeding problems.  Follow-up echocardiogram in April revealed LVEF 55 to 60% with mild diastolic dysfunction, normally functioning mechanical AV prosthesis with mean gradient 16 mmHg.  Last LDL was 79 in April of this year.  Past Medical History:  Diagnosis Date  . Anxiety   . Aortic stenosis   . Cholelithiasis   . Coronary artery disease    Nonobstructive CAD (40-50% LAD) 08/2019  . Depression   . Essential hypertension   . Mixed hyperlipidemia   . Morbid obesity (HCC)   . S/P aortic valve replacement with mechanical valve 12/05/2019   25 mm Carbomedics top hat bileaflet mechanical valve via partial upper hemi-sternotomy  . Type 2 diabetes mellitus (HCC)     Past Surgical History:  Procedure Laterality Date  . ABDOMINAL AORTOGRAM W/LOWER EXTREMITY N/A 07/06/2019   Procedure: ABDOMINAL AORTOGRAM W/LOWER EXTREMITY;  Surgeon: Sherren Kerns, MD;  Location: [facility] INVASIVE CV LAB;  Service: Cardiovascular;  Laterality: N/A;  Bilateral  . AMPUTATION Left 07/09/2019   Procedure: LEFT  FOURTH and Fifth TOE AMPUTATION.;  Surgeon: Larina Earthly, MD;  Location: [facility] OR;  Service: Vascular;  Laterality: Left;  . AORTIC VALVE REPLACEMENT N/A 12/05/2019   Procedure: PARTIAL STERNOTOMY FOR AORTIC VALVE REPLACEMENT (AVR), USING CARBOMEDICS SUPRA-ANNULAR TOP HAT ;  Surgeon: Purcell Nails, MD;  Location: [facility] OR;  Service: Open Heart Surgery;  Laterality: N/A;  No neck lines on left  . IR RADIOLOGY PERIPHERAL GUIDED IV START  10/05/2019  . IR US GUIDE VASC ACCESS RIGHT  10/05/2019  . MULTIPLE EXTRACTIONS WITH ALVEOLOPLASTY N/A 10/26/2019   Procedure: EXTRACTION OF TOOTH #'S 3, 5-11,19-28,  AND 32 WITH ALVEOLOPLASTY;  Surgeon: Charlynne Pander, DDS;  Location: [facility] OR;  Service: Oral Surgery;  Laterality: N/A;  . RIGHT HEART CATH AND CORONARY ANGIOGRAPHY N/A 09/24/2019   Procedure: RIGHT HEART CATH AND CORONARY ANGIOGRAPHY;  Surgeon: Tonny Bollman, MD;  Location: [facility] INVASIVE CV LAB;  Service: Cardiovascular;  Laterality: N/A;  . TEE WITHOUT CARDIOVERSION N/A 12/05/2019   Procedure: TRANSESOPHAGEAL ECHOCARDIOGRAM (TEE);  Surgeon: Purcell Nails, MD;  Location[facility] OR;  Service: Open Heart Surgery;  Laterality: N/A;    Current Outpatient Medications  Medication Sig Dispense Refill  . aspirin EC 81 MG tablet Take 81 mg by mouth every morning.     Marland Kitchen atorvastatin (LIPITOR) 40 MG tablet TAKE 1 Tablet BY MOUTH ONCE EVERY DAY 90 tablet 1  . Insulin Glargine (LANTUS SOLOSTAR) 100 UNIT/ML Solostar Pen Inject 40 Units into the skin at bedtime. (Patient  taking differently: Inject 30 Units into the skin at bedtime. ) 15 mL   . JANUMET 50-500 MG tablet TAKE 1 Tablet  BY MOUTH TWICE DAILY WITH A MEAL 180 tablet 0  . lisinopril (ZESTRIL) 20 MG tablet TAKE 1 Tablet BY MOUTH ONCE EVERY DAY 90 tablet 0  . metoprolol tartrate (LOPRESSOR) 25 MG tablet Take 1 tablet (25 mg total) by mouth 2 (two) times daily. 180 tablet 1  . warfarin (COUMADIN) 10 MG tablet Take 1 1/2 tablets daily except 1 tablet on Sundays,  Tuesdays and Thursdays or as directed 45 tablet 3   No current facility-administered medications for this visit.   Allergies:  Pollen extract   Social History: The patient  reports that he has never smoked. He has quit using smokeless tobacco.  His smokeless tobacco use included chew. He reports previous alcohol use. He reports that he does not use drugs.   Family History: The patient's family history includes Cancer in his mother; Heart disease in his father; Heart murmur in his sister; Hyperlipidemia in his father; Hypertension in his father; Stroke in his father.   ROS:   He does not report any palpitations or syncope.  Physical Exam: VS:  BP 106/68   Pulse 74   Ht 5\' 10"  (1.778 m)   Wt (!) 357 lb (161.9 kg)   SpO2 97%   BMI 51.22 kg/m , BMI Body mass index is 51.22 kg/m.  Wt Readings from Last 3 Encounters:  04/01/20 (!) 357 lb (161.9 kg)  01/24/20 (!) 365 lb (165.6 kg)  01/24/20 (!) 365 lb 3.2 oz (165.7 kg)    General: Morbidly obese male, appears comfortable at rest. HEENT: Conjunctiva and lids normal, wearing a mask. Neck: Supple, no elevated JVP or carotid bruits, no thyromegaly. Lungs: Clear to auscultation, nonlabored breathing at rest. Cardiac: Regular rate and rhythm, no S3, mechanical click in S2 with 2/6 systolic murmur.  no pericardial rub. Abdomen: Obese, nontender, bowel sounds present. Extremities: Venous stasis, distal pulses 2+.  ECG:  An ECG dated 12/06/2019 was personally reviewed today and demonstrated:  Sinus rhythm with R' in lead V1, nonspecific ST-T changes.  Recent Labwork: 12/03/2019: ALT 27; AST 23 12/06/2019: Magnesium 2.1 12/08/2019: Hemoglobin 11.9; Platelets 188 12/10/2019: BUN 9; Creatinine, Ser 0.80; Potassium 4.6; Sodium 137     Component Value Date/Time   CHOL 137 01/17/2020 0832   TRIG 121 01/17/2020 0832   HDL 34 (L) 01/17/2020 0832   CHOLHDL 4.0 01/17/2020 0832   VLDL 24 01/17/2020 0832   LDLCALC 79 01/17/2020 0832    Other  Studies Reviewed Today:  Echocardiogram 01/22/2020: 1. Left ventricular ejection fraction, by estimation, is 55 to 60%. The  left ventricle has normal function. The left ventricle has no regional  wall motion abnormalities. There is mild left ventricular hypertrophy.  Left ventricular diastolic parameters  are consistent with Grade I diastolic dysfunction (impaired relaxation).  2. Right ventricular systolic function is normal. The right ventricular  size is normal. Tricuspid regurgitation signal is inadequate for assessing  PA pressure.  3. The mitral valve is abnormal, moderately calcified annulus. Trivial  mitral valve regurgitation.  4. The aortic valve has been repaired/replaced. There is a 25 mm  Carbometrics Top Hat bileaflet mechanical prosthesis in position. Aortic  valve regurgitation is trivial. Mean gradient 16 mmHg.  5. The inferior vena cava is normal in size with greater than 50%  respiratory variability, suggesting right atrial pressure of 3 mmHg.   Assessment and Plan:  1.  Severe aortic stenosis status post mechanical AVR in March of this year as outlined above.  He is doing very well at this point, follow-up echocardiogram in April reviewed with normal valve function.  LVEF 55 to 60%.  Continue Coumadin with follow-up in anticoagulation clinic.  2.  Morbid obesity, he seems focused on losing more weight.  States that he has modified his diet.  3.  Nonobstructive LAD disease by cardiac catheterization in December 2020.  No active angina symptoms.  I suspect we will stop aspirin around the time of his next visit since he will stay on Coumadin long-term.  Otherwise continue statin therapy.  4.  Essential hypertension, blood pressure is very well controlled today.  He remains on lisinopril and Lopressor.  Medication Adjustments/Labs and Tests Ordered: Current medicines are reviewed at length with the patient today.  Concerns regarding medicines are outlined above.    Tests Ordered: No orders of the defined types were placed in this encounter.   Medication Changes: No orders of the defined types were placed in this encounter.   Disposition:  Follow up 6 months in the [place] office.  Signed, Jonelle Sidle, MD, [facility] 04/01/2020 2:06 PM    [facility] at [facility] [address], [place], [place] [address] Phone: [phone]; Fax: [phone]

## 2020-04-01 NOTE — Patient Instructions (Signed)
Medication Instructions:  °Your physician recommends that you continue on your current medications as directed. Please refer to the Current Medication list given to you today. ° °*If you need a refill on your cardiac medications before your next appointment, please call your pharmacy* ° ° °Lab Work: °None today °If you have labs (blood work) drawn today and your tests are completely normal, you will receive your results only by: °• MyChart Message (if you have MyChart) OR °• A paper copy in the mail °If you have any lab test that is abnormal or we need to change your treatment, we will call you to review the results. ° ° °Testing/Procedures: °None today ° ° °Follow-Up: °At [facility], you and your health needs are our priority.  As part of our continuing mission to provide you with exceptional heart care, we have created designated Provider Care Teams.  These Care Teams include your primary Cardiologist (physician) and Advanced Practice Providers (APPs -  Physician Assistants and Nurse Practitioners) who all work together to provide you with the care you need, when you need it. ° °We recommend signing up for the patient portal called "MyChart".  Sign up information is provided on this After Visit Summary.  MyChart is used to connect with patients for Virtual Visits (Telemedicine).  Patients are able to view lab/test results, encounter notes, upcoming appointments, etc.  Non-urgent messages can be sent to your provider as well.   °To learn more about what you can do with MyChart, go to [email or web address].   ° °Your next appointment:   °6 month(s) ° °The format for your next appointment:   °In Person ° °Provider:   °Samuel McDowell, MD ° ° °Other Instructions °None ° ° ° ° °Thank you for choosing [facility] ! ° ° ° ° ° ° ° ° °

## 2020-04-03 ENCOUNTER — Ambulatory Visit: Payer: Self-pay | Admitting: Cardiovascular Disease

## 2020-04-11 ENCOUNTER — Other Ambulatory Visit: Payer: Self-pay | Admitting: Physician Assistant

## 2020-04-11 DIAGNOSIS — IMO0002 Reserved for concepts with insufficient information to code with codable children: Secondary | ICD-10-CM

## 2020-04-11 DIAGNOSIS — E785 Hyperlipidemia, unspecified: Secondary | ICD-10-CM

## 2020-04-11 DIAGNOSIS — I10 Essential (primary) hypertension: Secondary | ICD-10-CM

## 2020-04-15 ENCOUNTER — Ambulatory Visit (INDEPENDENT_AMBULATORY_CARE_PROVIDER_SITE_OTHER): Payer: Self-pay | Admitting: *Deleted

## 2020-04-15 DIAGNOSIS — Z952 Presence of prosthetic heart valve: Secondary | ICD-10-CM

## 2020-04-15 DIAGNOSIS — Z5181 Encounter for therapeutic drug level monitoring: Secondary | ICD-10-CM

## 2020-04-15 LAB — POCT INR: INR: 2.2 (ref 2.0–3.0)

## 2020-04-15 NOTE — Patient Instructions (Signed)
Continue warfarin 1 tablet daily except 1 1/2 tablets on Mondays, Wednesdays and Fridays °Recheck in 6 wks °

## 2020-04-17 ENCOUNTER — Other Ambulatory Visit: Payer: Self-pay

## 2020-04-17 ENCOUNTER — Other Ambulatory Visit (HOSPITAL_COMMUNITY)
Admission: RE | Admit: 2020-04-17 | Discharge: 2020-04-17 | Disposition: A | Discharge status: Home / Self Care | Payer: Self-pay | Source: Ambulatory Visit | Attending: Physician Assistant | Admitting: Physician Assistant

## 2020-04-17 DIAGNOSIS — IMO0002 Reserved for concepts with insufficient information to code with codable children: Secondary | ICD-10-CM

## 2020-04-17 DIAGNOSIS — E1165 Type 2 diabetes mellitus with hyperglycemia: Secondary | ICD-10-CM | POA: Insufficient documentation

## 2020-04-17 DIAGNOSIS — E785 Hyperlipidemia, unspecified: Secondary | ICD-10-CM | POA: Insufficient documentation

## 2020-04-17 DIAGNOSIS — I10 Essential (primary) hypertension: Secondary | ICD-10-CM | POA: Insufficient documentation

## 2020-04-17 DIAGNOSIS — E118 Type 2 diabetes mellitus with unspecified complications: Secondary | ICD-10-CM | POA: Insufficient documentation

## 2020-04-17 LAB — COMPREHENSIVE METABOLIC PANEL
ALT: 24 U/L (ref 0–44)
AST: 23 U/L (ref 15–41)
Albumin: 3.8 g/dL (ref 3.5–5.0)
Alkaline Phosphatase: 62 U/L (ref 38–126)
Anion gap: 8 (ref 5–15)
BUN: 17 mg/dL (ref 6–20)
CO2: 26 mmol/L (ref 22–32)
Calcium: 9 mg/dL (ref 8.9–10.3)
Chloride: 101 mmol/L (ref 98–111)
Creatinine, Ser: 0.78 mg/dL (ref 0.61–1.24)
GFR calc Af Amer: >60 mL/min (ref >60)
GFR calc non Af Amer: >60 mL/min (ref >60)
Glucose, Bld: 128 mg/dL — ABNORMAL HIGH (ref 70–99)
Potassium: 4.5 mmol/L (ref 3.5–5.1)
Sodium: 135 mmol/L (ref 135–145)
Total Bilirubin: 1.2 mg/dL (ref 0.3–1.2)
Total Protein: 7.4 g/dL (ref 6.5–8.1)

## 2020-04-17 LAB — LIPID PANEL
Cholesterol: 154 mg/dL (ref 0–200)
HDL: 31 mg/dL — ABNORMAL LOW (ref >40)
LDL Cholesterol: 101 mg/dL — ABNORMAL HIGH (ref 0–99)
Total CHOL/HDL Ratio: 5 RATIO
Triglycerides: 108 mg/dL (ref <150)
VLDL: 22 mg/dL (ref 0–40)

## 2020-04-21 ENCOUNTER — Encounter: Payer: Self-pay | Admitting: Physician Assistant

## 2020-04-21 ENCOUNTER — Ambulatory Visit: Payer: Self-pay | Admitting: Physician Assistant

## 2020-04-21 ENCOUNTER — Other Ambulatory Visit: Payer: Self-pay

## 2020-04-21 VITALS — BP 114/73 | HR 77 | Temp 98.1°F | Ht 70.0 in | Wt 354.0 lb

## 2020-04-21 DIAGNOSIS — Z7901 Long term (current) use of anticoagulants: Secondary | ICD-10-CM

## 2020-04-21 DIAGNOSIS — IMO0002 Reserved for concepts with insufficient information to code with codable children: Secondary | ICD-10-CM

## 2020-04-21 DIAGNOSIS — I10 Essential (primary) hypertension: Secondary | ICD-10-CM

## 2020-04-21 DIAGNOSIS — Z952 Presence of prosthetic heart valve: Secondary | ICD-10-CM

## 2020-04-21 DIAGNOSIS — E785 Hyperlipidemia, unspecified: Secondary | ICD-10-CM

## 2020-04-21 NOTE — Patient Instructions (Addendum)
[facility] [address] [place], [place] [address] [phone] Wed. May 21, 2020 10:00 AM

## 2020-04-21 NOTE — Progress Notes (Signed)
BP 114/73   Pulse 77   Temp 98.1 F (36.7 C)   Ht 5\' 10"  (1.778 m)   Wt (!) 354 lb (160.6 kg)   SpO2 99%   BMI 50.79 kg/m    Subjective:    Patient ID: Adam Long, male    DOB: [birth year], 44 y.o.   MRN: [id number]  HPI: Adam Long is a 44 y.o. male presenting on 04/21/2020 for Follow-up   HPI    Pt had a negative covid 19 screening questionnaire.    Pt is 43yoM who recently underwent aortic valve replacement and is now on anti-coagulation therapy.  His DM is managed by endocrinology.   He is feeling much better since his surgery.  He is working on getting his weight down with healthy diet and regular exercise.  Pt got both doses of covid vaccinations.  He has no complaints today.      Relevant past medical, surgical, family and social history reviewed and updated as indicated. Interim medical history since our last visit reviewed. Allergies and medications reviewed and updated.   Current Outpatient Medications:  .  aspirin EC 81 MG tablet, Take 81 mg by mouth every morning. , Disp: , Rfl:  .  atorvastatin (LIPITOR) 40 MG tablet, TAKE 1 Tablet BY MOUTH ONCE EVERY DAY, Disp: 90 tablet, Rfl: 1 .  Insulin Glargine (LANTUS SOLOSTAR) 100 UNIT/ML Solostar Pen, Inject 40 Units into the skin at bedtime. (Patient taking differently: Inject 30 Units into the skin at bedtime. ), Disp: 15 mL, Rfl:  .  JANUMET 50-500 MG tablet, TAKE 1 Tablet  BY MOUTH TWICE DAILY WITH A MEAL, Disp: 180 tablet, Rfl: 0 .  lisinopril (ZESTRIL) 20 MG tablet, TAKE 1 Tablet BY MOUTH ONCE EVERY DAY, Disp: 90 tablet, Rfl: 0 .  metoprolol tartrate (LOPRESSOR) 25 MG tablet, Take 1 tablet (25 mg total) by mouth 2 (two) times daily., Disp: 180 tablet, Rfl: 1 .  warfarin (COUMADIN) 10 MG tablet, Take 1 1/2 tablets daily except 1 tablet on Sundays, Tuesdays and Thursdays or as directed, Disp: 45 tablet, Rfl: 3    Review of Systems  Per HPI unless specifically indicated above     Objective:     BP 114/73   Pulse 77   Temp 98.1 F (36.7 C)   Ht 5\' 10"  (1.778 m)   Wt (!) 354 lb (160.6 kg)   SpO2 99%   BMI 50.79 kg/m   Wt Readings from Last 3 Encounters:  04/21/20 (!) 354 lb (160.6 kg)  04/01/20 (!) 357 lb (161.9 kg)  01/24/20 (!) 365 lb (165.6 kg)    Physical Exam Vitals reviewed.  Constitutional:      General: He is not in acute distress.    Appearance: He is well-developed. He is obese.  HENT:     Head: Normocephalic and atraumatic.  Cardiovascular:     Rate and Rhythm: Normal rate and regular rhythm.     Heart sounds: Murmur heard.   Pulmonary:     Effort: Pulmonary effort is normal.     Breath sounds: Normal breath sounds. No wheezing.  Abdominal:     General: Bowel sounds are normal.     Palpations: Abdomen is soft.     Tenderness: There is no abdominal tenderness.  Musculoskeletal:     Cervical back: Neck supple.     Right lower leg: No edema.     Left lower leg: No edema.  Lymphadenopathy:     Cervical:  No cervical adenopathy.  Skin:    General: Skin is warm and dry.  Neurological:     Mental Status: He is alert and oriented to person, place, and time.  Psychiatric:        Behavior: Behavior normal.     Results for orders placed or performed during the hospital encounter of 04/17/20  Lipid panel  Result Value Ref Range   Cholesterol 154 0 - 200 mg/dL   Triglycerides 546 <270 mg/dL   HDL 31 (L) >35 mg/dL   Total CHOL/HDL Ratio 5.0 RATIO   VLDL 22 0 - 40 mg/dL   LDL Cholesterol 009 (H) 0 - 99 mg/dL  Comprehensive metabolic panel  Result Value Ref Range   Sodium 135 135 - 145 mmol/L   Potassium 4.5 3.5 - 5.1 mmol/L   Chloride 101 98 - 111 mmol/L   CO2 26 22 - 32 mmol/L   Glucose, Bld 128 (H) 70 - 99 mg/dL   BUN 17 6 - 20 mg/dL   Creatinine, Ser 3.81 0.61 - 1.24 mg/dL   Calcium 9.0 8.9 - 82.9 mg/dL   Total Protein 7.4 6.5 - 8.1 g/dL   Albumin 3.8 3.5 - 5.0 g/dL   AST 23 15 - 41 U/L   ALT 24 0 - 44 U/L   Alkaline Phosphatase 62 38 -  126 U/L   Total Bilirubin 1.2 0.3 - 1.2 mg/dL   GFR calc non Af Amer >60 >60 mL/min   GFR calc Af Amer >60 >60 mL/min   Anion gap 8 5 - 15      Assessment & Plan:    Encounter Diagnoses  Name Primary?  . Hyperlipidemia, unspecified hyperlipidemia type Yes  . Essential hypertension, benign   . Uncontrolled type 2 diabetes mellitus with complication (HCC)   . Morbid obesity, unspecified obesity type (HCC)   . S/P aortic valve replacement   . Anticoagulated      -Reviewed labs with pt -discussed with pt that If lipids not improved 3 months, will increase atorvastatin.  He is currently taking 40mg  daily -pt to continue with endocrinology and cardiology per their recomendations -pt to follow up  3 months.  He is to contact office sooner prn

## 2020-05-27 ENCOUNTER — Other Ambulatory Visit: Payer: Self-pay

## 2020-05-27 ENCOUNTER — Ambulatory Visit (INDEPENDENT_AMBULATORY_CARE_PROVIDER_SITE_OTHER): Payer: Self-pay | Admitting: *Deleted

## 2020-05-27 DIAGNOSIS — Z952 Presence of prosthetic heart valve: Secondary | ICD-10-CM

## 2020-05-27 DIAGNOSIS — Z5181 Encounter for therapeutic drug level monitoring: Secondary | ICD-10-CM

## 2020-05-27 LAB — POCT INR: INR: 2.5 (ref 2.0–3.0)

## 2020-05-27 MED ORDER — WARFARIN SODIUM 10 MG PO TABS
ORAL_TABLET | ORAL | 6 refills | Status: DC
Start: 2020-05-27 — End: 2020-10-23

## 2020-05-27 NOTE — Patient Instructions (Signed)
Continue warfarin 1 tablet daily except 1 1/2 tablets on Mondays, Wednesdays and Fridays °Recheck in 6 wks °

## 2020-05-29 ENCOUNTER — Ambulatory Visit: Payer: Self-pay | Admitting: Nutrition

## 2020-05-29 ENCOUNTER — Ambulatory Visit: Payer: Self-pay | Admitting: Nurse Practitioner

## 2020-06-13 ENCOUNTER — Other Ambulatory Visit: Payer: Self-pay | Admitting: Physician Assistant

## 2020-06-14 LAB — COMPLETE METABOLIC PANEL WITH GFR
AG Ratio: 1.4 (calc) (ref 1.0–2.5)
ALT: 20 U/L (ref 9–46)
AST: 19 U/L (ref 10–40)
Albumin: 4 g/dL (ref 3.6–5.1)
Alkaline phosphatase (APISO): 57 U/L (ref 36–130)
BUN: 16 mg/dL (ref 7–25)
CO2: 28 mmol/L (ref 20–32)
Calcium: 9.5 mg/dL (ref 8.6–10.3)
Chloride: 98 mmol/L (ref 98–110)
Creat: 0.75 mg/dL (ref 0.60–1.35)
GFR, Est African American: 129 mL/min/{1.73_m2} (ref >=60)
GFR, Est Non African American: 112 mL/min/{1.73_m2} (ref >=60)
Globulin: 2.9 g/dL (calc) (ref 1.9–3.7)
Glucose, Bld: 166 mg/dL — ABNORMAL HIGH (ref 65–99)
Potassium: 4.5 mmol/L (ref 3.5–5.3)
Sodium: 134 mmol/L — ABNORMAL LOW (ref 135–146)
Total Bilirubin: 1.2 mg/dL (ref 0.2–1.2)
Total Protein: 6.9 g/dL (ref 6.1–8.1)

## 2020-06-14 LAB — T4, FREE: Free T4: 1 ng/dL (ref 0.8–1.8)

## 2020-06-14 LAB — VITAMIN D 25 HYDROXY (VIT D DEFICIENCY, FRACTURES): Vit D, 25-Hydroxy: 38 ng/mL (ref 30–100)

## 2020-06-14 LAB — TSH: TSH: 2.25 mIU/L (ref 0.40–4.50)

## 2020-06-14 LAB — MICROALBUMIN / CREATININE URINE RATIO
Creatinine, Urine: 36 mg/dL (ref 20–320)
Microalb Creat Ratio: 11 mcg/mg creat (ref <30)
Microalb, Ur: 0.4 mg/dL

## 2020-06-19 ENCOUNTER — Other Ambulatory Visit: Payer: Self-pay

## 2020-06-19 ENCOUNTER — Encounter: Payer: MEDICAID | Attending: "Endocrinology | Admitting: Nutrition

## 2020-06-19 ENCOUNTER — Encounter: Payer: Self-pay | Admitting: Nutrition

## 2020-06-19 ENCOUNTER — Ambulatory Visit (INDEPENDENT_AMBULATORY_CARE_PROVIDER_SITE_OTHER): Payer: Self-pay | Admitting: Nurse Practitioner

## 2020-06-19 ENCOUNTER — Encounter: Payer: Self-pay | Admitting: Nurse Practitioner

## 2020-06-19 VITALS — BP 116/77 | HR 74 | Ht 70.0 in | Wt 358.8 lb

## 2020-06-19 DIAGNOSIS — E782 Mixed hyperlipidemia: Secondary | ICD-10-CM | POA: Insufficient documentation

## 2020-06-19 DIAGNOSIS — E1165 Type 2 diabetes mellitus with hyperglycemia: Secondary | ICD-10-CM | POA: Insufficient documentation

## 2020-06-19 DIAGNOSIS — IMO0002 Reserved for concepts with insufficient information to code with codable children: Secondary | ICD-10-CM

## 2020-06-19 DIAGNOSIS — I10 Essential (primary) hypertension: Secondary | ICD-10-CM | POA: Insufficient documentation

## 2020-06-19 DIAGNOSIS — E118 Type 2 diabetes mellitus with unspecified complications: Secondary | ICD-10-CM | POA: Insufficient documentation

## 2020-06-19 LAB — POCT GLYCOSYLATED HEMOGLOBIN (HGB A1C): Hemoglobin A1C: 6 % — AB (ref 4.0–5.6)

## 2020-06-19 NOTE — Patient Instructions (Signed)
Goals  Walk 30 mintues a day Lose 1 lb per week Cut out 250 calories of snacks, condiments. And other processed foods. Keep drinking water.

## 2020-06-19 NOTE — Progress Notes (Signed)
  Medical Nutrition Therapy:  Appt start time:1045 end time:  1100   Assessment:  Primary concerns today: Diabetes Type 2 and Obesity. A1C 6% .Originally it was 11.5%. Saw Ronny Bacon, FNP. Lantus 30 units at night. Walking 2 miles per day but hasn't been walking as much. Eating more fresh fruits and vegetables. Cooks most meals at home.  CMP Latest Ref Rng & Units 06/13/2020 04/17/2020 12/10/2019  Glucose 65 - 99 mg/dL 767(M) 094(B) 096(G)  BUN 7 - 25 mg/dL 16 17 9   Creatinine 0.60 - 1.35 mg/dL 8.36 6.29  Sodium 135 - 146 mmol/L 134(L) 135 137  Potassium 3.5 - 5.3 mmol/L 4.5 4.5 4.6  Chloride 98 - 110 mmol/L 98 101 100  CO2 20 - 32 mmol/L 28 26 29   Calcium 8.6 - 10.3 mg/dL 9.5 9.0 8.9  Total Protein 6.1 - 8.1 g/dL 6.9 7.4 -  Total Bilirubin 0.2 - 1.2 mg/dL 1.2 1.2 -  Alkaline Phos 38 - 126 U/L - 62 -  AST 10 - 40 U/L 19 23 -  ALT 9 - 46 U/L 20 24 -   Lab Results  Component Value Date   HGBA1C 6.0 (A) 06/19/2020   Lipid Panel     Component Value Date/Time   CHOL 154 04/17/2020 0855   TRIG 108 04/17/2020 0855   HDL 31 (L) 04/17/2020 0855   CHOLHDL 5.0 04/17/2020 0855   VLDL 22 04/17/2020 0855   LDLCALC 101 (H) 04/17/2020 0855    Preferred Learning Style:   No preference indicated   Learning Readiness:  Ready  Change in progress  MEDICATIONS:    DIETARY INTAKE:  24-hr recall:  B ( AM): Rice chex, banana and milk, egg,  L ( PM):  Chicken breast on salad, water D  Small salad and spaghetti WW noodles. Water, Usual physical activity: walking   Estimated energy needs: 1800 calories 200 g carbohydrates 135 g protein 50 g fat  Progress Towards Goal(s):  In progress.   Nutritional Diagnosis:  NB-1.1 Food and nutrition-related knowledge deficit As related to Diabetes and Obesity.  As evidenced by A1C 11% and BMI > 40.    Intervention:  Nutrition and Diabetes education provided on My Plate, CHO counting, meal planning, portion sizes, timing of meals,  avoiding snacks between meals unless having a low blood sugar, target ranges for A1C and blood sugars, signs/symptoms and treatment of hyper/hypoglycemia, monitoring blood sugars, taking medications as prescribed, benefits of exercising 30 minutes per day and prevention of complications of DM.  Goals  Walk 30 mintues a day Lose 1 lb per week Cut out 250 calories of snacks, condiments. And other processed foods. Keep drinking water.   Teaching Method Utilized:  Visual Auditory Hands on  Handouts given during visit include:  The Plate Method  Meal Plan  Low Cholesterol Diet  Low Salt     Barriers to learning/adherence to lifestyle change: Heart condition   Demonstrated degree of understanding via:  Teach Back   Monitoring/Evaluation:  Dietary intake, exercise, , and body weight in 3-4 month(s).

## 2020-06-19 NOTE — Patient Instructions (Signed)

## 2020-06-19 NOTE — Progress Notes (Signed)
06/19/2020  Endocrinology follow-up note   Subjective:    Patient ID: Adam Long, male    DOB: January 05, 1976. Patient is being seen in follow-up for management of currently uncontrolled, complicated type 2 diabetes . PCP:   Jacquelin Hawking, PA-C   Past Medical History:  Diagnosis Date  . Anxiety   . Aortic stenosis   . Cholelithiasis   . Coronary artery disease    Nonobstructive CAD (40-50% LAD) 08/2019  . Depression   . Essential hypertension   . Mixed hyperlipidemia   . Morbid obesity (HCC)   . S/P aortic valve replacement with mechanical valve 12/05/2019   25 mm Carbomedics top hat bileaflet mechanical valve via partial upper hemi-sternotomy  . Type 2 diabetes mellitus (HCC)    Past Surgical History:  Procedure Laterality Date  . ABDOMINAL AORTOGRAM W/LOWER EXTREMITY N/A 07/06/2019   Procedure: ABDOMINAL AORTOGRAM W/LOWER EXTREMITY;  Surgeon: Sherren Kerns, MD;  Location: [facility] INVASIVE CV LAB;  Service: Cardiovascular;  Laterality: N/A;  Bilateral  . AMPUTATION Left 07/09/2019   Procedure: LEFT FOURTH and Fifth TOE AMPUTATION.;  Surgeon: Larina Earthly, MD;  Location: [facility] OR;  Service: Vascular;  Laterality: Left;  . AORTIC VALVE REPLACEMENT N/A 12/05/2019   Procedure: PARTIAL STERNOTOMY FOR AORTIC VALVE REPLACEMENT (AVR), USING CARBOMEDICS SUPRA-ANNULAR TOP HAT ;  Surgeon: Purcell Nails, MD;  Location[facility] OR;  Service: Open Heart Surgery;  Laterality: N/A;  No neck lines on left  . IR RADIOLOGY PERIPHERAL GUIDED IV START  10/05/2019  . IR US GUIDE VASC ACCESS RIGHT  10/05/2019  . MULTIPLE EXTRACTIONS WITH ALVEOLOPLASTY N/A 10/26/2019   Procedure: EXTRACTION OF TOOTH #'S 3, 5-11,19-28,  AND 32 WITH ALVEOLOPLASTY;  Surgeon: Charlynne Pander, DDS;  Location: [facility] OR;  Service: Oral Surgery;  Laterality: N/A;  . RIGHT HEART CATH AND CORONARY ANGIOGRAPHY N/A 09/24/2019   Procedure: RIGHT HEART CATH AND CORONARY ANGIOGRAPHY;  Surgeon: Tonny Bollman, MD;  Location: [facility] INVASIVE  CV LAB;  Service: Cardiovascular;  Laterality: N/A;  . TEE WITHOUT CARDIOVERSION N/A 12/05/2019   Procedure: TRANSESOPHAGEAL ECHOCARDIOGRAM (TEE);  Surgeon: Purcell Nails, MD;  Location: [facility] OR;  Service: Open Heart Surgery;  Laterality: N/A;   Social History   Socioeconomic History  . Marital status: Single    Spouse name: Not on file  . Number of children: Not on file  . Years of education: Not on file  . Highest education level: Not on file  Occupational History  . Not on file  Tobacco Use  . Smoking status: Never Smoker  . Smokeless tobacco: Former Neurosurgeon    Types: Engineer, drilling  . Vaping Use: Never used  Substance and Sexual Activity  . Alcohol use: Not Currently    Comment: occasionally  . Drug use: No  . Sexual activity: Not on file  Other Topics Concern  . Not on file  Social History Narrative  . Not on file   Social Determinants of Health   Financial Resource Strain:   . Difficulty of Paying Living Expenses: Not on file  Food Insecurity:   . Worried About Programme researcher, broadcasting/film/video in the Last Year: Not on file  . Ran Out of Food in the Last Year: Not on file  Transportation Needs:   . Lack of Transportation (Medical): Not on file  . Lack of Transportation (Non-Medical): Not on file  Physical Activity:   . Days of Exercise per Week: Not on file  . Minutes of  Exercise per Session: Not on file  Stress:   . Feeling of Stress : Not on file  Social Connections:   . Frequency of Communication with Friends and Family: Not on file  . Frequency of Social Gatherings with Friends and Family: Not on file  . Attends Religious Services: Not on file  . Active Member of Clubs or Organizations: Not on file  . Attends Banker Meetings: Not on file  . Marital Status: Not on file   Outpatient Encounter Medications as of 06/19/2020  Medication Sig  . aspirin EC 81 MG tablet Take 81 mg by mouth every morning.   Marland Kitchen atorvastatin (LIPITOR) 40 MG tablet TAKE 1 Tablet BY  MOUTH ONCE EVERY DAY  . Insulin Glargine (LANTUS SOLOSTAR) 100 UNIT/ML Solostar Pen Inject 40 Units into the skin at bedtime. (Patient taking differently: Inject 20 Units into the skin at bedtime. )  . JANUMET 50-500 MG tablet TAKE 1 Tablet  BY MOUTH TWICE DAILY WITH A MEAL  . lisinopril (ZESTRIL) 20 MG tablet TAKE 1 Tablet BY MOUTH ONCE EVERY DAY  . metoprolol tartrate (LOPRESSOR) 25 MG tablet TAKE 1 Tablet  BY MOUTH TWICE DAILY  . warfarin (COUMADIN) 10 MG tablet Take 1 tablet daily except 1 1/2 tablets on Mondays, Wednesdays and Fridays or as directed   No facility-administered encounter medications on file as of 06/19/2020.   ALLERGIES: Allergies  Allergen Reactions  . Pollen Extract Other (See Comments)    Runny nose, watery eyes, sneezing   VACCINATION STATUS: Immunization History  Administered Date(s) Administered  . Influenza Split 07/01/2015, 06/30/2016  . Influenza,inj,Quad PF,6+ Mos 06/30/2017, 07/30/2018, 06/12/2019, 06/07/2020  . Moderna SARS-COVID-2 Vaccination 01/03/2020, 02/05/2020  . Tdap 06/11/2019    Diabetes He presents for his follow-up diabetic visit. He has type 2 diabetes mellitus. Onset time: He was diagnosed at approximate age of 44 years. His disease course has been improving. There are no hypoglycemic associated symptoms. Pertinent negatives for hypoglycemia include no confusion, headaches, pallor or seizures. Pertinent negatives for diabetes include no blurred vision, no chest pain, no fatigue, no polydipsia, no polyphagia, no polyuria and no weakness. There are no hypoglycemic complications. Symptoms are improving. Diabetic complications include heart disease and PVD. (Peripheral artery disease complicated by partial amputation of left foot.  Since his last visit, he underwent aortic valve replacement.) Risk factors for coronary artery disease include diabetes mellitus, dyslipidemia, family history, obesity, male sex, hypertension and sedentary lifestyle. Current  diabetic treatment includes oral agent (dual therapy) and insulin injections. He is compliant with treatment all of the time. His weight is decreasing steadily. He is following a generally healthy diet. When asked about meal planning, he reported none. He has not had a previous visit with a dietitian. He never participates in exercise. His home blood glucose trend is decreasing steadily. His breakfast blood glucose range is generally 90-110 mg/dl. His bedtime blood glucose range is generally 110-130 mg/dl. (He presents today with his meter and logs showing at target fasting and postprandial glycemic profile.  His POCT A1C today is 6%, improving from last visit of 6.3%.  He denies any episodes of hypoglycemia. ) An ACE inhibitor/angiotensin II receptor blocker is being taken. He sees a podiatrist.Eye exam is current.  Hyperlipidemia This is a chronic problem. The current episode started more than 1 year ago. The problem is controlled. Recent lipid tests were reviewed and are normal. Exacerbating diseases include diabetes and obesity. Pertinent negatives include no chest pain, myalgias or shortness  of breath. Current antihyperlipidemic treatment includes statins. The current treatment provides moderate improvement of lipids. Compliance problems include adherence to exercise.  Risk factors for coronary artery disease include diabetes mellitus, dyslipidemia, hypertension, male sex, obesity, a sedentary lifestyle and family history.  Hypertension This is a chronic problem. The current episode started more than 1 year ago. The problem has been gradually improving since onset. The problem is controlled. Pertinent negatives include no blurred vision, chest pain, headaches, neck pain, palpitations or shortness of breath. There are no associated agents to hypertension. Risk factors for coronary artery disease include diabetes mellitus, dyslipidemia, male gender, obesity and sedentary lifestyle. Past treatments include  beta blockers and ACE inhibitors. The current treatment provides moderate improvement. There are no compliance problems.  Hypertensive end-organ damage includes PVD.   Review of systems  Constitutional: + Minimally fluctuating body weight,  current Body mass index is 51.48 kg/m. , no fatigue, no subjective hyperthermia, no subjective hypothermia Eyes: no blurry vision, no xerophthalmia ENT: no sore throat, no nodules palpated in throat, no dysphagia/odynophagia, no hoarseness Cardiovascular: no chest pain, no shortness of breath, no palpitations, no leg swelling Respiratory: no cough, no shortness of breath Gastrointestinal: no nausea/vomiting/diarrhea Musculoskeletal: no muscle/joint aches Skin: no rashes, no hyperemia Neurological: no tremors, no numbness, no tingling, no dizziness Psychiatric: no depression, no anxiety  Objective:    BP 116/77 (BP Location: Left Arm, Patient Position: Sitting)   Pulse 74   Ht 5\' 10"  (1.778 m)   Wt (!) 358 lb 12.8 oz (162.8 kg)   BMI 51.48 kg/m   Wt Readings from Last 3 Encounters:  06/19/20 (!) 358 lb 12.8 oz (162.8 kg)  04/21/20 (!) 354 lb (160.6 kg)  04/01/20 (!) 357 lb (161.9 kg)    BP Readings from Last 3 Encounters:  06/19/20 116/77  04/21/20 114/73  04/01/20 106/68    Physical Exam- Limited  Constitutional:  Body mass index is 51.48 kg/m. , not in acute distress, normal state of mind Eyes:  EOMI, no exophthalmos Neck: Supple Thyroid: No gross goiter Cardiovascular: RRR, no murmers, rubs, or gallops, no edema Respiratory: Adequate breathing efforts, no crackles, rales, rhonchi, or wheezing Musculoskeletal: no gross deformities, strength intact in all four extremities, no gross restriction of joint movements, partial amputation of left foot Skin:  no rashes, no hyperemia, very little hair noted to entire body Neurological: no tremor with outstretched hands    CMP ( most recent) CMP     Component Value Date/Time   NA 134  (L) 06/13/2020 0837   NA 134 09/10/2019 1231   K 4.5 06/13/2020 0837   CL 98 06/13/2020 0837   CO2 28 06/13/2020 0837   GLUCOSE 166 (H) 06/13/2020 0837   BUN 16 06/13/2020 0837   BUN 12 09/10/2019 1231   CREATININE 0.75 06/13/2020 0837   CALCIUM 9.5 06/13/2020 0837   PROT 6.9 06/13/2020 0837   ALBUMIN 3.8 04/17/2020 0855   AST 19 06/13/2020 0837   ALT 20 06/13/2020 0837   ALKPHOS 62 04/17/2020 0855   BILITOT 1.2 06/13/2020 0837   GFRNONAA 112 06/13/2020 0837   GFRAA 129 06/13/2020 0837     Diabetic Labs (most recent): Lab Results  Component Value Date   HGBA1C 6.0 (A) 06/19/2020   HGBA1C 6.3 (A) 01/24/2020   HGBA1C 7.2 (H) 12/03/2019     Lipid Panel ( most recent) Lipid Panel     Component Value Date/Time   CHOL 154 04/17/2020 0855   TRIG 108 04/17/2020 0855  HDL 31 (L) 04/17/2020 0855   CHOLHDL 5.0 04/17/2020 0855   VLDL 22 04/17/2020 0855   LDLCALC 101 (H) 04/17/2020 0855      Assessment & Plan:   1. Uncontrolled type 2 diabetes mellitus with complication, unspecified whether long term insulin use (HCC)  - Patient has currently uncontrolled symptomatic type 2 DM since  44 years of age.  He presents today with his meter and logs showing at target fasting and postprandial glycemic profile.  His POCT A1C today is 6%, improving from last visit of 6.3%.  He denies any episodes of hypoglycemia.  -He is recent labs are reviewed with him.    His diabetes is recently complicated by peripheral arterial disease with partial amputation of left foot, obesity/sedentary life, noncompliance/nonadherence, lack of proper insurance for medications and doctors visits and patient remains at a high risk for more acute and chronic complications of diabetes which include CAD, CVA, CKD, retinopathy, and neuropathy. These are all discussed in detail with the patient.  - I have counseled the patient on diet management and weight loss, by adopting a carbohydrate restricted/protein rich  diet.  - The patient admits there is a room for improvement in their diet and drink choices. -  Suggestion is made for the patient to avoid simple carbohydrates from their diet including Cakes, Sweet Desserts / Pastries, Ice Cream, Soda (diet and regular), Sweet Tea, Candies, Chips, Cookies, Sweet Pastries,  Store Bought Juices, Alcohol in Excess of  1-2 drinks a day, Artificial Sweeteners, Coffee Creamer, and "Sugar-free" Products. This will help patient to have stable blood glucose profile and potentially avoid unintended weight gain.   - I encouraged the patient to switch to  unprocessed or minimally processed complex starch and increased protein intake (animal or plant source), fruits, and vegetables.   - Patient is advised to stick to a routine mealtimes to eat 3 meals  a day and avoid unnecessary snacks ( to snack only to correct hypoglycemia).  - The patient has been  scheduled with Norm Salt, RDN, CDE for individualized DM education, seeing her today after his visit with me.  - I have approached patient with the following individualized plan to manage diabetes and patient agrees:   -Given his significant response, he will benefit from simplified treatment options. - He is tolerating his current regimen well and is advised to continue Lantus 30 units SQ daily at bedtime and Janumet 50/500 mg po twice daily with meals.  -He is encouraged to continue monitoring blood glucose at least twice daily, before breakfast and before bed and call the clinic if readings are less than 70 or greater than 200 for 3 tests in a row.    - Patient specific target  A1c;  LDL, HDL, Triglycerides, were discussed in detail.  2) BP/HTN:  His blood pressure is controlled to target.  He is advised to continue Metoprolol 25 mg po twice daily and lisinopril 20 mg po daily.  3) Lipids/HPL:   His most recent lipid panel from 04/17/20 showed controlled LDL at 98.  He is advised to continue atorvastatin 40 mg po  daily at bedtime.  Side effects and precautions discussed with him.    4)  Weight/Diet:  His Body mass index is 51.48 kg/m.-clearly complicating his diabetes care.  He is working on losing weight, has lost some but gained some back. He is still  a candidate for major weight loss.  I discussed with him the fact that loss of 5-10 percent  of his body weight will impact his diabetes significantly.  He is a candidate for bariatric surgery. We will explore this option later.  CDE Consult has been initiated;  exercise, and detailed carbohydrates information provided.  5) Chronic Care/Health Maintenance: -Patient is on Statin medications and encouraged to continue to follow up with Ophthalmology, Podiatrist at least yearly or according to recommendations, and advised to   stay away from smoking. I have recommended yearly flu vaccine and pneumonia vaccination at least every 5 years; moderate intensity exercise for up to 150 minutes weekly; and  sleep for at least 7 hours a day.  - I advised patient to maintain close follow up with Jacquelin Hawking, PA-C for primary care needs.  - Time spent on this patient care encounter:  35 min, of which > 50% was spent in  counseling and the rest reviewing his blood glucose logs , discussing his hypoglycemia and hyperglycemia episodes, reviewing his current and  previous labs / studies  ( including abstraction from other facilities) and medications  doses and developing a  long term treatment plan and documenting his care.   Please refer to Patient Instructions for Blood Glucose Monitoring and Insulin/Medications Dosing Guide"  in media tab for additional information. Please  also refer to " Patient Self Inventory" in the Media  tab for reviewed elements of pertinent patient history.  [name] participated in the discussions, expressed understanding, and voiced agreement with the above plans.  All questions were answered to his satisfaction. he is encouraged to  contact clinic should he have any questions or concerns prior to his return visit.   Follow up plan: - Return in about 6 months (around 12/17/2020) for Diabetes follow up, Previsit labs, Virtual visit ok.  Ronny Bacon, [facility] [address] [place], [place] [address] Phone: [phone] Fax: [phone]  06/19/2020, 11:02 AM

## 2020-07-03 ENCOUNTER — Other Ambulatory Visit: Payer: Self-pay | Admitting: Physician Assistant

## 2020-07-03 DIAGNOSIS — E785 Hyperlipidemia, unspecified: Secondary | ICD-10-CM

## 2020-07-03 DIAGNOSIS — E1169 Type 2 diabetes mellitus with other specified complication: Secondary | ICD-10-CM

## 2020-07-03 DIAGNOSIS — I10 Essential (primary) hypertension: Secondary | ICD-10-CM

## 2020-07-08 ENCOUNTER — Ambulatory Visit (INDEPENDENT_AMBULATORY_CARE_PROVIDER_SITE_OTHER): Payer: Self-pay | Admitting: *Deleted

## 2020-07-08 DIAGNOSIS — Z952 Presence of prosthetic heart valve: Secondary | ICD-10-CM

## 2020-07-08 DIAGNOSIS — Z5181 Encounter for therapeutic drug level monitoring: Secondary | ICD-10-CM

## 2020-07-08 LAB — POCT INR: INR: 3.1 — AB (ref 2.0–3.0)

## 2020-07-08 NOTE — Patient Instructions (Signed)
Continue warfarin 1 tablet daily except 1 1/2 tablets on Mondays, Wednesdays and Fridays °Recheck in 6 wks °

## 2020-07-17 ENCOUNTER — Other Ambulatory Visit: Payer: Self-pay

## 2020-07-17 ENCOUNTER — Other Ambulatory Visit (HOSPITAL_COMMUNITY)
Admission: RE | Admit: 2020-07-17 | Discharge: 2020-07-17 | Disposition: A | Discharge status: Home / Self Care | Payer: Self-pay | Source: Ambulatory Visit | Attending: Physician Assistant | Admitting: Physician Assistant

## 2020-07-17 DIAGNOSIS — E785 Hyperlipidemia, unspecified: Secondary | ICD-10-CM | POA: Insufficient documentation

## 2020-07-17 DIAGNOSIS — E1169 Type 2 diabetes mellitus with other specified complication: Secondary | ICD-10-CM | POA: Insufficient documentation

## 2020-07-17 DIAGNOSIS — I10 Essential (primary) hypertension: Secondary | ICD-10-CM | POA: Insufficient documentation

## 2020-07-17 LAB — COMPREHENSIVE METABOLIC PANEL
ALT: 25 U/L (ref 0–44)
AST: 24 U/L (ref 15–41)
Albumin: 4 g/dL (ref 3.5–5.0)
Alkaline Phosphatase: 51 U/L (ref 38–126)
Anion gap: 11 (ref 5–15)
BUN: 15 mg/dL (ref 6–20)
CO2: 27 mmol/L (ref 22–32)
Calcium: 9.6 mg/dL (ref 8.9–10.3)
Chloride: 98 mmol/L (ref 98–111)
Creatinine, Ser: 0.75 mg/dL (ref 0.61–1.24)
GFR, Estimated: >60 mL/min (ref >60)
Glucose, Bld: 115 mg/dL — ABNORMAL HIGH (ref 70–99)
Potassium: 5 mmol/L (ref 3.5–5.1)
Sodium: 136 mmol/L (ref 135–145)
Total Bilirubin: 1.2 mg/dL (ref 0.3–1.2)
Total Protein: 7.4 g/dL (ref 6.5–8.1)

## 2020-07-17 LAB — LIPID PANEL
Cholesterol: 131 mg/dL (ref 0–200)
HDL: 38 mg/dL — ABNORMAL LOW (ref >40)
LDL Cholesterol: 79 mg/dL (ref 0–99)
Total CHOL/HDL Ratio: 3.4 RATIO
Triglycerides: 70 mg/dL (ref <150)
VLDL: 14 mg/dL (ref 0–40)

## 2020-07-22 ENCOUNTER — Encounter: Payer: Self-pay | Admitting: Physician Assistant

## 2020-07-22 ENCOUNTER — Ambulatory Visit: Payer: Self-pay | Admitting: Physician Assistant

## 2020-07-22 VITALS — BP 119/72

## 2020-07-22 DIAGNOSIS — I10 Essential (primary) hypertension: Secondary | ICD-10-CM

## 2020-07-22 DIAGNOSIS — Z952 Presence of prosthetic heart valve: Secondary | ICD-10-CM

## 2020-07-22 DIAGNOSIS — E669 Obesity, unspecified: Secondary | ICD-10-CM

## 2020-07-22 DIAGNOSIS — E785 Hyperlipidemia, unspecified: Secondary | ICD-10-CM

## 2020-07-22 DIAGNOSIS — Z7901 Long term (current) use of anticoagulants: Secondary | ICD-10-CM

## 2020-07-22 DIAGNOSIS — E1169 Type 2 diabetes mellitus with other specified complication: Secondary | ICD-10-CM

## 2020-07-22 MED ORDER — LANTUS SOLOSTAR 100 UNIT/ML ~~LOC~~ SOPN: 30.0000 [IU] | PEN_INJECTOR | Freq: Every day | SUBCUTANEOUS | Status: DC

## 2020-07-22 NOTE — Progress Notes (Signed)
BP 119/72    Subjective:    Patient ID: Adam Long, male    DOB: 1976-01-10, 44 y.o.   MRN: [id number]  HPI: Adam Long is a 44 y.o. male presenting on 07/22/2020 for No chief complaint on file.   HPI    This is a telemedicine appointment through [employer] due to coronavirus pandemic.  I connected with  [name] on 07/22/20 by a video enabled telemedicine application and verified that I am speaking with the correct person using two identifiers.   I discussed the limitations of evaluation and management by telemedicine. The patient expressed understanding and agreed to proceed.  Pt is at home.  Provider is at office.   pt is a 44yoM with DM who sees endocrinology for management, is s/p AVR and is currently anti-coagulated. He is followed by cardiology.  He also has dyslipidemia and HTN.   He monitors his BP at home.   He says he is Doing exercising - walking He says he has No problems and is feeling great He is Currently using 30u insulin    Relevant past medical, surgical, family and social history reviewed and updated as indicated. Interim medical history since our last visit reviewed. Allergies and medications reviewed and updated.   Current Outpatient Medications:  .  aspirin EC 81 MG tablet, Take 81 mg by mouth every morning. , Disp: , Rfl:  .  atorvastatin (LIPITOR) 40 MG tablet, TAKE 1 Tablet BY MOUTH ONCE EVERY DAY, Disp: 90 tablet, Rfl: 1 .  Insulin Glargine (LANTUS SOLOSTAR) 100 UNIT/ML Solostar Pen, Inject 40 Units into the skin at bedtime. (Patient taking differently: Inject 20 Units into the skin at bedtime. ), Disp: 15 mL, Rfl:  .  JANUMET 50-500 MG tablet, TAKE 1 Tablet  BY MOUTH TWICE DAILY WITH A MEAL, Disp: 180 tablet, Rfl: 1 .  lisinopril (ZESTRIL) 20 MG tablet, TAKE 1 Tablet BY MOUTH ONCE EVERY DAY, Disp: 90 tablet, Rfl: 1 .  metoprolol tartrate (LOPRESSOR) 25 MG tablet, TAKE 1 Tablet  BY MOUTH TWICE DAILY, Disp: 180 tablet, Rfl: 1 .   warfarin (COUMADIN) 10 MG tablet, Take 1 tablet daily except 1 1/2 tablets on Mondays, Wednesdays and Fridays or as directed, Disp: 45 tablet, Rfl: 6    Review of Systems  Per HPI unless specifically indicated above     Objective:    BP 119/72   Wt Readings from Last 3 Encounters:  06/19/20 (!) 358 lb (162.4 kg)  06/19/20 (!) 358 lb 12.8 oz (162.8 kg)  04/21/20 (!) 354 lb (160.6 kg)    Physical Exam Constitutional:      General: He is not in acute distress.    Appearance: He is not ill-appearing.  HENT:     Head: Normocephalic and atraumatic.  Pulmonary:     Effort: No respiratory distress.  Neurological:     Mental Status: He is alert and oriented to person, place, and time.  Psychiatric:        Mood and Affect: Mood normal.        Behavior: Behavior normal.     Results for orders placed or performed during the hospital encounter of 07/17/20  Lipid panel  Result Value Ref Range   Cholesterol 131 0 - 200 mg/dL   Triglycerides 70 <884 mg/dL   HDL 38 (L) >16 mg/dL   Total CHOL/HDL Ratio 3.4 RATIO   VLDL 14 0 - 40 mg/dL   LDL Cholesterol 79 0 - 99  mg/dL  Comprehensive metabolic panel  Result Value Ref Range   Sodium 136 135 - 145 mmol/L   Potassium 5.0 3.5 - 5.1 mmol/L   Chloride 98 98 - 111 mmol/L   CO2 27 22 - 32 mmol/L   Glucose, Bld 115 (H) 70 - 99 mg/dL   BUN 15 6 - 20 mg/dL   Creatinine, Ser 5.88 0.61 - 1.24 mg/dL   Calcium 9.6 8.9 - 32.5 mg/dL   Total Protein 7.4 6.5 - 8.1 g/dL   Albumin 4.0 3.5 - 5.0 g/dL   AST 24 15 - 41 U/L   ALT 25 0 - 44 U/L   Alkaline Phosphatase 51 38 - 126 U/L   Total Bilirubin 1.2 0.3 - 1.2 mg/dL   GFR, Estimated >49 >82 mL/min   Anion gap 11 5 - 15      Assessment & Plan:   Encounter Diagnoses  Name Primary?  . Essential hypertension, benign Yes  . Hyperlipidemia, unspecified hyperlipidemia type   . Type 2 diabetes mellitus with other specified complication, unspecified whether long term insulin use (HCC)   . S/P  aortic valve replacement   . Anticoagulated   . Obesity, unspecified classification, unspecified obesity type, unspecified whether serious comorbidity present      -Reviewed labs with pt -Pt to Contine current medications -pt to continue with regular exercise -pt to follow up 4 months.  He is to contact office sooner prn

## 2020-08-19 ENCOUNTER — Ambulatory Visit (INDEPENDENT_AMBULATORY_CARE_PROVIDER_SITE_OTHER): Payer: Self-pay | Admitting: *Deleted

## 2020-08-19 ENCOUNTER — Other Ambulatory Visit: Payer: Self-pay

## 2020-08-19 DIAGNOSIS — Z5181 Encounter for therapeutic drug level monitoring: Secondary | ICD-10-CM

## 2020-08-19 DIAGNOSIS — Z952 Presence of prosthetic heart valve: Secondary | ICD-10-CM

## 2020-08-19 LAB — POCT INR: INR: 1.8 — AB (ref 2.0–3.0)

## 2020-08-19 NOTE — Patient Instructions (Signed)
Take 2 tablet tonight then resume 1 tablet daily except 1 1/2 tablets on Mondays, Wednesdays and Fridays Recheck in 4 wks

## 2020-08-25 ENCOUNTER — Encounter: Payer: Self-pay | Admitting: Physician Assistant

## 2020-09-04 ENCOUNTER — Ambulatory Visit: Payer: Self-pay

## 2020-09-09 ENCOUNTER — Encounter: Payer: Self-pay | Admitting: Cardiology

## 2020-09-09 ENCOUNTER — Ambulatory Visit (INDEPENDENT_AMBULATORY_CARE_PROVIDER_SITE_OTHER): Payer: Self-pay | Admitting: Cardiology

## 2020-09-09 ENCOUNTER — Ambulatory Visit (INDEPENDENT_AMBULATORY_CARE_PROVIDER_SITE_OTHER): Payer: Self-pay | Admitting: *Deleted

## 2020-09-09 VITALS — BP 120/70 | HR 80 | Ht 70.0 in | Wt 351.8 lb

## 2020-09-09 DIAGNOSIS — I10 Essential (primary) hypertension: Secondary | ICD-10-CM

## 2020-09-09 DIAGNOSIS — Z5181 Encounter for therapeutic drug level monitoring: Secondary | ICD-10-CM

## 2020-09-09 DIAGNOSIS — Z952 Presence of prosthetic heart valve: Secondary | ICD-10-CM

## 2020-09-09 DIAGNOSIS — I2510 Atherosclerotic heart disease of native coronary artery without angina pectoris: Secondary | ICD-10-CM

## 2020-09-09 LAB — POCT INR: INR: 2.1 (ref 2.0–3.0)

## 2020-09-09 NOTE — Progress Notes (Signed)
Cardiology Office Note  Date: 09/09/2020   ID: Adam Long, DOB [birth year], MRN [id number]  PCP:  Soyla Dryer, PA-C  Cardiologist:  Rozann Lesches, MD Electrophysiologist:  None   Chief Complaint  Patient presents with  . Cardiac follow-up    History of Present Illness: Adam Long is a 44 y.o. male that I met back in July.  He presents for a routine visit.  Reports no major change, no angina symptoms or worsening shortness of breath with typical activities.  He is on Coumadin with follow-up in the anticoagulation clinic.  INR today was 2.1.  He does not report any spontaneous bleeding problems.  I reviewed his medications which are outlined below.  We discussed stopping aspirin at this point.  He had a follow-up echocardiogram in April with normal prosthetic function.  Past Medical History:  Diagnosis Date  . Anxiety   . Aortic stenosis   . Cholelithiasis   . Coronary artery disease    Nonobstructive CAD (40-50% LAD) 08/2019  . Depression   . Essential hypertension   . Mixed hyperlipidemia   . Morbid obesity ([facility])   . S/P aortic valve replacement with mechanical valve 12/05/2019   25 mm Carbomedics top hat bileaflet mechanical valve via partial upper hemi-sternotomy  . Type 2 diabetes mellitus ([facility])     Past Surgical History:  Procedure Laterality Date  . ABDOMINAL AORTOGRAM W/LOWER EXTREMITY N/A 07/06/2019   Procedure: ABDOMINAL AORTOGRAM W/LOWER EXTREMITY;  Surgeon: Elam Dutch, MD;  Location: [facility];  Service: Cardiovascular;  Laterality: N/A;  Bilateral  . AMPUTATION Left 07/09/2019   Procedure: LEFT FOURTH and Fifth TOE AMPUTATION.;  Surgeon: Rosetta Posner, MD;  Location: [facility];  Service: Vascular;  Laterality: Left;  . AORTIC VALVE REPLACEMENT N/A 12/05/2019   Procedure: PARTIAL STERNOTOMY FOR AORTIC VALVE REPLACEMENT (AVR), USING CARBOMEDICS SUPRA-ANNULAR TOP HAT 25MM;  Surgeon: Rexene Alberts, MD;  Location: [facility];  Service:  Open Heart Surgery;  Laterality: N/A;  No neck lines on left  . IR RADIOLOGY PERIPHERAL GUIDED IV START  10/05/2019  . IR US GUIDE VASC ACCESS RIGHT  10/05/2019  . MULTIPLE EXTRACTIONS WITH ALVEOLOPLASTY N/A 10/26/2019   Procedure: EXTRACTION OF TOOTH #'S 3, 5-11,19-28,  AND 32 WITH ALVEOLOPLASTY;  Surgeon: Lenn Cal, DDS;  Location: [facility];  Service: Oral Surgery;  Laterality: N/A;  . RIGHT HEART CATH AND CORONARY ANGIOGRAPHY N/A 09/24/2019   Procedure: RIGHT HEART CATH AND CORONARY ANGIOGRAPHY;  Surgeon: Sherren Mocha, MD;  Location: [facility];  Service: Cardiovascular;  Laterality: N/A;  . TEE WITHOUT CARDIOVERSION N/A 12/05/2019   Procedure: TRANSESOPHAGEAL ECHOCARDIOGRAM (TEE);  Surgeon: Rexene Alberts, MD;  Location: [facility];  Service: Open Heart Surgery;  Laterality: N/A;    Current Outpatient Medications  Medication Sig Dispense Refill  . atorvastatin (LIPITOR) 40 MG tablet TAKE 1 Tablet BY MOUTH ONCE EVERY DAY 90 tablet 1  . insulin glargine (LANTUS SOLOSTAR) 100 UNIT/ML Solostar Pen Inject 30 Units into the skin at bedtime. 15 mL   . JANUMET 50-500 MG tablet TAKE 1 Tablet  BY MOUTH TWICE DAILY WITH A MEAL 180 tablet 1  . lisinopril (ZESTRIL) 20 MG tablet TAKE 1 Tablet BY MOUTH ONCE EVERY DAY 90 tablet 1  . metoprolol tartrate (LOPRESSOR) 25 MG tablet TAKE 1 Tablet  BY MOUTH TWICE DAILY 180 tablet 1  . warfarin (COUMADIN) 10 MG tablet Take 1 tablet daily except 1 1/2 tablets on  Mondays, Wednesdays and Fridays or as directed 45 tablet 6   No current facility-administered medications for this visit.   Allergies:  Pollen extract   ROS: No palpitations or syncope.  Physical Exam: VS:  BP 120/70   Pulse 80   Ht $R'5\' 10"'LD$  (1.778 m)   Wt (!) 351 lb 12.8 oz (159.6 kg)   SpO2 98%   BMI 50.48 kg/m , BMI Body mass index is 50.48 kg/m.  Wt Readings from Last 3 Encounters:  09/09/20 (!) 351 lb 12.8 oz (159.6 kg)  06/19/20 (!) 358 lb (162.4 kg)  06/19/20 (!) 358 lb 12.8 oz  (162.8 kg)    General: Patient appears comfortable at rest. HEENT: Conjunctiva and lids normal, wearing a mask. Neck: Supple, no elevated JVP or carotid bruits, no thyromegaly. Lungs: Clear to auscultation, nonlabored breathing at rest. Cardiac: Regular rate and rhythm, no S3, soft mechanical click in S2 with 2/6 systolic murmur, no pericardial rub. Extremities: No pitting edema.  ECG:  An ECG dated 12/06/2019 was personally reviewed today and demonstrated:  Sinus rhythm with R' in lead V1, nonspecific ST-T changes.  Recent Labwork: 12/06/2019: Magnesium 2.1 12/08/2019: Hemoglobin 11.9; Platelets 188 06/13/2020: TSH 2.25 07/17/2020: ALT 25; AST 24; BUN 15; Creatinine, Ser 0.75; Potassium 5.0; Sodium 136     Component Value Date/Time   CHOL 131 07/17/2020 0837   TRIG 70 07/17/2020 0837   HDL 38 (L) 07/17/2020 0837   CHOLHDL 3.4 07/17/2020 0837   VLDL 14 07/17/2020 0837   LDLCALC 79 07/17/2020 0837    Other Studies Reviewed Today:  Echocardiogram 01/22/2020: 1. Left ventricular ejection fraction, by estimation, is 55 to 60%. The  left ventricle has normal function. The left ventricle has no regional  wall motion abnormalities. There is mild left ventricular hypertrophy.  Left ventricular diastolic parameters  are consistent with Grade I diastolic dysfunction (impaired relaxation).  2. Right ventricular systolic function is normal. The right ventricular  size is normal. Tricuspid regurgitation signal is inadequate for assessing  PA pressure.  3. The mitral valve is abnormal, moderately calcified annulus. Trivial  mitral valve regurgitation.  4. The aortic valve has been repaired/replaced. There is a 25 mm  Carbometrics Top Hat bileaflet mechanical prosthesis in position. Aortic  valve regurgitation is trivial. Mean gradient 16 mmHg.  5. The inferior vena cava is normal in size with greater than 50%  respiratory variability, suggesting right atrial pressure of 3 mmHg.    Assessment and Plan:  1.  History of severe aortic stenosis status post mechanical AVR in March.  He continues to do well, remains on Coumadin with follow-up in the anticoagulation clinic.  Echocardiogram from April is noted above.  2.  Nonobstructive CAD, asymptomatic.  Plan to stop aspirin at this time as he will continue on Coumadin.  Otherwise continue Lipitor, last LDL 79.  3.  Essential hypertension, blood pressure is well controlled today.  Continue lisinopril and Lopressor.  Medication Adjustments/Labs and Tests Ordered: Current medicines are reviewed at length with the patient today.  Concerns regarding medicines are outlined above.   Tests Ordered: No orders of the defined types were placed in this encounter.   Medication Changes: No orders of the defined types were placed in this encounter.   Disposition:  Follow up 6 months in the [facility] office.  Signed, Satira Sark, MD, [facility] 09/09/2020 3:47 PM    [facility] [place], [place], [place] [address] Phone: [phone]; Fax: [phone] 

## 2020-09-09 NOTE — Patient Instructions (Signed)
Medication Instructions:   Your physician has recommended you make the following change in your medication:   Stop aspirin  Continue other medications the same  Labwork:  None  Testing/Procedures:  None  Follow-Up:  Your physician recommends that you schedule a follow-up appointment in: 6 months.   Any Other Special Instructions Will Be Listed Below (If Applicable).  If you need a refill on your cardiac medications before your next appointment, please call your pharmacy.

## 2020-09-09 NOTE — Patient Instructions (Signed)
Continue warfarin 1 tablet daily except 1 1/2 tablets on Mondays, Wednesdays and Fridays  Recheck in 4 wks 

## 2020-09-15 ENCOUNTER — Other Ambulatory Visit: Payer: Self-pay | Admitting: Physician Assistant

## 2020-09-30 ENCOUNTER — Ambulatory Visit (INDEPENDENT_AMBULATORY_CARE_PROVIDER_SITE_OTHER): Payer: 59 | Admitting: Nurse Practitioner

## 2020-09-30 ENCOUNTER — Other Ambulatory Visit: Payer: Self-pay

## 2020-09-30 ENCOUNTER — Encounter: Payer: Self-pay | Admitting: Nurse Practitioner

## 2020-09-30 VITALS — BP 139/82 | HR 86 | Temp 99.2°F | Resp 20 | Ht 70.0 in | Wt 356.0 lb

## 2020-09-30 DIAGNOSIS — F419 Anxiety disorder, unspecified: Secondary | ICD-10-CM

## 2020-09-30 DIAGNOSIS — IMO0002 Reserved for concepts with insufficient information to code with codable children: Secondary | ICD-10-CM

## 2020-09-30 DIAGNOSIS — Z139 Encounter for screening, unspecified: Secondary | ICD-10-CM | POA: Diagnosis not present

## 2020-09-30 DIAGNOSIS — Z954 Presence of other heart-valve replacement: Secondary | ICD-10-CM | POA: Diagnosis not present

## 2020-09-30 DIAGNOSIS — E118 Type 2 diabetes mellitus with unspecified complications: Secondary | ICD-10-CM | POA: Diagnosis not present

## 2020-09-30 DIAGNOSIS — I10 Essential (primary) hypertension: Secondary | ICD-10-CM | POA: Diagnosis not present

## 2020-09-30 DIAGNOSIS — E1165 Type 2 diabetes mellitus with hyperglycemia: Secondary | ICD-10-CM

## 2020-09-30 MED ORDER — SERTRALINE HCL 50 MG PO TABS: 50.0000 mg | ORAL_TABLET | Freq: Every day | ORAL | 3 refills | Status: DC

## 2020-09-30 NOTE — Assessment & Plan Note (Addendum)
-  BP well controlled today -BP meds managed by Dr. Diona Browner -taking metoprolol and lisinopril

## 2020-09-30 NOTE — Assessment & Plan Note (Signed)
-  he states he has been having panic attacks about 2x per week -he took lexapro in the past, and states that it helped some, but not much -Rx. Sertraline -f/u in 1 month -may need to consider psych referral if no improvement

## 2020-09-30 NOTE — Progress Notes (Signed)
New Patient Office Visit  Subjective:  Patient ID: Adam Long, male    DOB: 1976-08-08  Age: 45 y.o. MRN: [id number]  CC:  Chief Complaint  Patient presents with  . New Patient (Initial Visit)    HPI Adam Long presents for new patient visit. He was going to a free clinic d/t having no insurance, but would like to establish care today now that he has insurance. Last labs were drawn about 2 months ago. He had heart valve replacement last March, but is unsure of the date of his last physical. He sees Dr. Diona Browner for cardiology after heart valve replacement. He had 4th and 5th toes amputated previously. He states that he has been having panic attacks about twice per week. He took lexapro in the past and that helped some, but it took a long time.   Past Medical History:  Diagnosis Date  . Anxiety   . Aortic stenosis   . Cholelithiasis   . Coronary artery disease    Nonobstructive CAD (40-50% LAD) 08/2019  . Depression   . Depression    Phreesia 09/27/2020  . Diabetes mellitus without complication (HCC)    Phreesia 09/27/2020  . Essential hypertension   . Heart murmur    Phreesia 09/27/2020  . Hyperlipidemia    Phreesia 09/27/2020  . Hypertension    Phreesia 09/27/2020  . Mixed hyperlipidemia   . Morbid obesity (HCC)   . S/P aortic valve replacement with mechanical valve 12/05/2019   25 mm Carbomedics top hat bileaflet mechanical valve via partial upper hemi-sternotomy  . Type 2 diabetes mellitus (HCC)     Past Surgical History:  Procedure Laterality Date  . ABDOMINAL AORTOGRAM W/LOWER EXTREMITY N/A 07/06/2019   Procedure: ABDOMINAL AORTOGRAM W/LOWER EXTREMITY;  Surgeon: Sherren Kerns, MD;  Location: [facility];  Service: Cardiovascular;  Laterality: N/A;  Bilateral  . AMPUTATION Left 07/09/2019   Procedure: LEFT FOURTH and Fifth TOE AMPUTATION.;  Surgeon: Larina Earthly, MD;  Location: [facility];  Service: Vascular;  Laterality: Left;  . AORTIC VALVE  REPLACEMENT N/A 12/05/2019   Procedure: PARTIAL STERNOTOMY FOR AORTIC VALVE REPLACEMENT (AVR), USING CARBOMEDICS SUPRA-ANNULAR TOP HAT ;  Surgeon: Purcell Nails, MD;  Location: [facility];  Service: Open Heart Surgery;  Laterality: N/A;  No neck lines on left  . CARDIAC VALVE REPLACEMENT N/A    Phreesia 09/27/2020  . IR RADIOLOGY PERIPHERAL GUIDED IV START  10/05/2019  . IR US GUIDE VASC ACCESS RIGHT  10/05/2019  . MULTIPLE EXTRACTIONS WITH ALVEOLOPLASTY N/A 10/26/2019   Procedure: EXTRACTION OF TOOTH #'S 3, 5-11,19-28,  AND 32 WITH ALVEOLOPLASTY;  Surgeon: Charlynne Pander, DDS;  Location: [facility];  Service: Oral Surgery;  Laterality: N/A;  . RIGHT HEART CATH AND CORONARY ANGIOGRAPHY N/A 09/24/2019   Procedure: RIGHT HEART CATH AND CORONARY ANGIOGRAPHY;  Surgeon: Tonny Bollman, MD;  Location: [facility];  Service: Cardiovascular;  Laterality: N/A;  . TEE WITHOUT CARDIOVERSION N/A 12/05/2019   Procedure: TRANSESOPHAGEAL ECHOCARDIOGRAM (TEE);  Surgeon: Purcell Nails, MD;  Location: [facility] OR;  Service: Open Heart Surgery;  Laterality: N/A;    Family History  Problem Relation Age of Onset  . Cancer Mother        Brain  . Heart disease Father   . Hyperlipidemia Father   . Hypertension Father   . Stroke Father   . Heart murmur Sister     Social History   Socioeconomic History  . Marital  status: Single    Spouse name: Not on file  . Number of children: Not on file  . Years of education: Not on file  . Highest education level: Not on file  Occupational History    Comment: Neurosurgeon- self-employed  Tobacco Use  . Smoking status: Never Smoker  . Smokeless tobacco: Former Systems developer    Types: Secondary school teacher  . Vaping Use: Never used  Substance and Sexual Activity  . Alcohol use: Not Currently    Comment: occasionally  . Drug use: No  . Sexual activity: Yes    Birth control/protection: None  Other Topics Concern  . Not on file  Social History Narrative  . Not on file    Social Determinants of Health   Financial Resource Strain: Not on file  Food Insecurity: Not on file  Transportation Needs: Not on file  Physical Activity: Not on file  Stress: Not on file  Social Connections: Not on file  Intimate Partner Violence: Not on file    ROS Review of Systems  Constitutional: Negative.   Respiratory: Negative.   Cardiovascular: Negative.   Psychiatric/Behavioral: Negative for self-injury and suicidal ideas. The patient is nervous/anxious.     Objective:   Today's Vitals: BP 139/82   Pulse 86   Temp 99.2 F (37.3 C)   Resp 20   Ht 5\' 10"  (1.778 m)   Wt (!) 356 lb (161.5 kg)   SpO2 98%   BMI 51.08 kg/m   Physical Exam Constitutional:      Appearance: He is obese.  Cardiovascular:     Rate and Rhythm: Normal rate and regular rhythm.     Pulses: Normal pulses.     Heart sounds: Normal heart sounds.     Comments: Mechanical click Pulmonary:     Effort: Pulmonary effort is normal.     Breath sounds: Normal breath sounds.  Neurological:     Mental Status: He is alert.  Psychiatric:        Mood and Affect: Mood normal.        Behavior: Behavior normal.        Thought Content: Thought content normal.        Judgment: Judgment normal.     Comments: GAD-7 = 7     Assessment & Plan:   Problem List Items Addressed This Visit      Cardiovascular and Mediastinum   HTN (hypertension)    -BP well controlled today -BP meds managed by Dr. Domenic Polite -taking metoprolol and lisinopril        Endocrine   Uncontrolled type 2 diabetes mellitus with complication (HCC)    -no A1c to review today -takes lantus 30 units daily -takes sitagliptin/metformin 50/500mg  BID -managed by Dr. Dorris Fetch        Other   S/P aortic valve replacement with mechanical valve    -anticoagulated with warfarin; managed by Dr. Domenic Polite      Anxiety    -he states he has been having panic attacks about 2x per week -he took lexapro in the past, and states that it  helped some, but not much -Rx. Sertraline -f/u in 1 month -may need to consider psych referral if no improvement         Outpatient Encounter Medications as of 09/30/2020  Medication Sig  . atorvastatin (LIPITOR) 40 MG tablet TAKE 1 Tablet BY MOUTH ONCE EVERY DAY  . insulin glargine (LANTUS SOLOSTAR) 100 UNIT/ML Solostar Pen Inject 30 Units into the  skin at bedtime.  Marland Kitchen JANUMET 50-500 MG tablet TAKE 1 Tablet  BY MOUTH TWICE DAILY WITH A MEAL  . lisinopril (ZESTRIL) 20 MG tablet TAKE 1 Tablet BY MOUTH ONCE EVERY DAY  . metoprolol tartrate (LOPRESSOR) 25 MG tablet TAKE 1 Tablet  BY MOUTH TWICE DAILY  . warfarin (COUMADIN) 10 MG tablet Take 1 tablet daily except 1 1/2 tablets on Mondays, Wednesdays and Fridays or as directed   No facility-administered encounter medications on file as of 09/30/2020.    Follow-up: Return in about 1 month (around 10/31/2020) for Complete physcial exam.   Heather Roberts, NP

## 2020-09-30 NOTE — Patient Instructions (Signed)
It was great meeting you today.  We will meet back up in a month for a physical. Please get fasting labs drawn 2-3 days before your appointment.   Also, I called in sertraline to [facility] to help with anxiety.  It takes about a month to notice a big difference after starting this medication.

## 2020-09-30 NOTE — Assessment & Plan Note (Signed)
-  anticoagulated with warfarin; managed by Dr. Diona Browner

## 2020-09-30 NOTE — Assessment & Plan Note (Signed)
-  no A1c to review today -takes lantus 30 units daily -takes sitagliptin/metformin 50/500mg  BID -managed by Dr. Fransico Him

## 2020-10-07 ENCOUNTER — Other Ambulatory Visit: Payer: Self-pay

## 2020-10-07 ENCOUNTER — Ambulatory Visit (INDEPENDENT_AMBULATORY_CARE_PROVIDER_SITE_OTHER): Payer: 59 | Admitting: *Deleted

## 2020-10-07 DIAGNOSIS — Z5181 Encounter for therapeutic drug level monitoring: Secondary | ICD-10-CM | POA: Diagnosis not present

## 2020-10-07 DIAGNOSIS — Z952 Presence of prosthetic heart valve: Secondary | ICD-10-CM | POA: Diagnosis not present

## 2020-10-07 LAB — POCT INR: INR: 2.2 (ref 2.0–3.0)

## 2020-10-07 NOTE — Patient Instructions (Signed)
Continue warfarin 1 tablet daily except 1 1/2 tablets on Mondays, Wednesdays and Fridays  Recheck in 4 wks 

## 2020-10-23 ENCOUNTER — Other Ambulatory Visit: Payer: Self-pay | Admitting: *Deleted

## 2020-10-23 MED ORDER — WARFARIN SODIUM 10 MG PO TABS
ORAL_TABLET | ORAL | 6 refills | Status: DC
Start: 2020-10-23 — End: 2021-02-17

## 2020-11-01 LAB — CBC WITH DIFFERENTIAL/PLATELET
Basophils Absolute: 0.1 10*3/uL (ref 0.0–0.2)
Basos: 1 %
EOS (ABSOLUTE): 0.1 10*3/uL (ref 0.0–0.4)
Eos: 1 %
Hematocrit: 39.6 % (ref 37.5–51.0)
Hemoglobin: 13.2 g/dL (ref 13.0–17.7)
Immature Grans (Abs): 0 10*3/uL (ref 0.0–0.1)
Immature Granulocytes: 0 %
Lymphocytes Absolute: 2.3 10*3/uL (ref 0.7–3.1)
Lymphs: 25 %
MCH: 27.8 pg (ref 26.6–33.0)
MCHC: 33.3 g/dL (ref 31.5–35.7)
MCV: 84 fL (ref 79–97)
Monocytes Absolute: 0.6 10*3/uL (ref 0.1–0.9)
Monocytes: 7 %
Neutrophils Absolute: 6 10*3/uL (ref 1.4–7.0)
Neutrophils: 66 %
Platelets: 259 10*3/uL (ref 150–450)
RBC: 4.74 x10E6/uL (ref 4.14–5.80)
RDW: 13.5 % (ref 11.6–15.4)
WBC: 9 10*3/uL (ref 3.4–10.8)

## 2020-11-01 LAB — CMP14+EGFR
ALT: 24 IU/L (ref 0–44)
AST: 20 IU/L (ref 0–40)
Albumin/Globulin Ratio: 1.8 (ref 1.2–2.2)
Albumin: 4.2 g/dL (ref 4.0–5.0)
Alkaline Phosphatase: 70 IU/L (ref 44–121)
BUN/Creatinine Ratio: 19 (ref 9–20)
BUN: 15 mg/dL (ref 6–24)
Bilirubin Total: 1.1 mg/dL (ref 0.0–1.2)
CO2: 25 mmol/L (ref 20–29)
Calcium: 9.3 mg/dL (ref 8.7–10.2)
Chloride: 97 mmol/L (ref 96–106)
Creatinine, Ser: 0.8 mg/dL (ref 0.76–1.27)
GFR calc Af Amer: 126 mL/min/{1.73_m2} (ref >59)
GFR calc non Af Amer: 109 mL/min/{1.73_m2} (ref >59)
Globulin, Total: 2.4 g/dL (ref 1.5–4.5)
Glucose: 95 mg/dL (ref 65–99)
Potassium: 5.2 mmol/L (ref 3.5–5.2)
Sodium: 134 mmol/L (ref 134–144)
Total Protein: 6.6 g/dL (ref 6.0–8.5)

## 2020-11-01 LAB — LIPID PANEL WITH LDL/HDL RATIO
Cholesterol, Total: 151 mg/dL (ref 100–199)
HDL: 48 mg/dL (ref >39)
LDL Chol Calc (NIH): 83 mg/dL (ref 0–99)
LDL/HDL Ratio: 1.7 ratio (ref 0.0–3.6)
Triglycerides: 111 mg/dL (ref 0–149)
VLDL Cholesterol Cal: 20 mg/dL (ref 5–40)

## 2020-11-01 LAB — HCV INTERPRETATION

## 2020-11-01 LAB — HCV AB W/RFLX TO VERIFICATION: HCV Ab: <0.1 s/co ratio (ref 0.0–0.9)

## 2020-11-03 NOTE — Progress Notes (Signed)
All labs are looking great. No issues with blood, kidneys, liver, or cholesterol. HIV and Hep C were negative as well.

## 2020-11-04 ENCOUNTER — Encounter: Payer: Self-pay | Admitting: Nurse Practitioner

## 2020-11-04 ENCOUNTER — Other Ambulatory Visit: Payer: Self-pay

## 2020-11-04 ENCOUNTER — Ambulatory Visit (INDEPENDENT_AMBULATORY_CARE_PROVIDER_SITE_OTHER): Payer: 59 | Admitting: Nurse Practitioner

## 2020-11-04 ENCOUNTER — Ambulatory Visit (INDEPENDENT_AMBULATORY_CARE_PROVIDER_SITE_OTHER): Payer: 59 | Admitting: *Deleted

## 2020-11-04 VITALS — BP 136/80 | HR 72 | Temp 98.0°F | Ht 70.0 in | Wt 357.0 lb

## 2020-11-04 DIAGNOSIS — Z5181 Encounter for therapeutic drug level monitoring: Secondary | ICD-10-CM | POA: Diagnosis not present

## 2020-11-04 DIAGNOSIS — Z23 Encounter for immunization: Secondary | ICD-10-CM | POA: Diagnosis not present

## 2020-11-04 DIAGNOSIS — I10 Essential (primary) hypertension: Secondary | ICD-10-CM

## 2020-11-04 DIAGNOSIS — Z89422 Acquired absence of other left toe(s): Secondary | ICD-10-CM

## 2020-11-04 DIAGNOSIS — E1165 Type 2 diabetes mellitus with hyperglycemia: Secondary | ICD-10-CM

## 2020-11-04 DIAGNOSIS — E118 Type 2 diabetes mellitus with unspecified complications: Secondary | ICD-10-CM | POA: Diagnosis not present

## 2020-11-04 DIAGNOSIS — Z0000 Encounter for general adult medical examination without abnormal findings: Secondary | ICD-10-CM

## 2020-11-04 DIAGNOSIS — Z952 Presence of prosthetic heart valve: Secondary | ICD-10-CM | POA: Diagnosis not present

## 2020-11-04 DIAGNOSIS — E782 Mixed hyperlipidemia: Secondary | ICD-10-CM

## 2020-11-04 DIAGNOSIS — Z954 Presence of other heart-valve replacement: Secondary | ICD-10-CM | POA: Diagnosis not present

## 2020-11-04 DIAGNOSIS — F419 Anxiety disorder, unspecified: Secondary | ICD-10-CM

## 2020-11-04 DIAGNOSIS — IMO0002 Reserved for concepts with insufficient information to code with codable children: Secondary | ICD-10-CM

## 2020-11-04 LAB — POCT INR: INR: 3 (ref 2.0–3.0)

## 2020-11-04 MED ORDER — ATORVASTATIN CALCIUM 80 MG PO TABS: 80.0000 mg | ORAL_TABLET | Freq: Every day | ORAL | 3 refills | Status: DC

## 2020-11-04 NOTE — Assessment & Plan Note (Addendum)
-  A1c to be drawn by endocrinology (N[facility]) -taking lantus 30 units daily -takes Janumet (sitagliptin-metformin) 50-500 mg BID -foot exam performed today -he will set up appt. with podiatrist for toenail trimming

## 2020-11-04 NOTE — Assessment & Plan Note (Signed)
-  pneumovax administered today

## 2020-11-04 NOTE — Progress Notes (Signed)
Established Patient Office Visit  Subjective:  Patient ID: Adam Long, male    DOB: [birth year]  Age: 45 y.o. MRN: [id number]  CC:  Chief Complaint  Patient presents with  . Annual Exam    CPE    HPI Adam Long presents for routine physical. He has hx of HTN as well as DM.  He is followed by Endocrinology, so no A1c was drawn with this set of labs.  BP well controlled, and no adverse medication effects.  He has hx of aortic valve replacement, and he has INR monitored by cardiology. He states his anxiety has been much better after swapping to sertraline, but he has noticed that he has very vivid dreams with it.  Past Medical History:  Diagnosis Date  . Anxiety   . Aortic stenosis   . Cellulitis and abscess of lower extremity 06/11/2019  . Cellulitis of fourth toe of left foot   . Cholelithiasis   . Coronary artery disease    Nonobstructive CAD (40-50% LAD) 08/2019  . Depression   . Depression    Phreesia 09/27/2020  . Depression    Phreesia 11/01/2020  . Diabetes mellitus without complication (HCC)    Phreesia 09/27/2020  . Elevated troponin level not due myocardial infarction 11/11/2019  . Essential hypertension   . Gangrene of toe of left foot (HCC) 07/06/2019  . Heart murmur    Phreesia 09/27/2020  . Hyperlipidemia    Phreesia 09/27/2020  . Hypertension    Phreesia 09/27/2020  . Mixed hyperlipidemia   . Morbid obesity (HCC)   . S/P aortic valve replacement with mechanical valve 12/05/2019   25 mm Carbomedics top hat bileaflet mechanical valve via partial upper hemi-sternotomy  . Severe aortic stenosis 09/24/2019  . Type 2 diabetes mellitus (HCC)     Past Surgical History:  Procedure Laterality Date  . ABDOMINAL AORTOGRAM W/LOWER EXTREMITY N/A 07/06/2019   Procedure: ABDOMINAL AORTOGRAM W/LOWER EXTREMITY;  Surgeon: Sherren Kerns, MD;  Location: [facility] INVASIVE CV LAB;  Service: Cardiovascular;  Laterality: N/A;  Bilateral  . AMPUTATION Left 07/09/2019    Procedure: LEFT FOURTH and Fifth TOE AMPUTATION.;  Surgeon: Larina Earthly, MD;  Location: [facility] OR;  Service: Vascular;  Laterality: Left;  . AORTIC VALVE REPLACEMENT N/A 12/05/2019   Procedure: PARTIAL STERNOTOMY FOR AORTIC VALVE REPLACEMENT (AVR), USING CARBOMEDICS SUPRA-ANNULAR TOP HAT ;  Surgeon: Purcell Nails, MD;  Location: [facility] OR;  Service: Open Heart Surgery;  Laterality: N/A;  No neck lines on left  . CARDIAC VALVE REPLACEMENT N/A    Phreesia 09/27/2020  . IR RADIOLOGY PERIPHERAL GUIDED IV START  10/05/2019  . IR US GUIDE VASC ACCESS RIGHT  10/05/2019  . MULTIPLE EXTRACTIONS WITH ALVEOLOPLASTY N/A 10/26/2019   Procedure: EXTRACTION OF TOOTH #'S 3, 5-11,19-28,  AND 32 WITH ALVEOLOPLASTY;  Surgeon: Charlynne Pander, DDS;  Location: [facility] OR;  Service: Oral Surgery;  Laterality: N/A;  . RIGHT HEART CATH AND CORONARY ANGIOGRAPHY N/A 09/24/2019   Procedure: RIGHT HEART CATH AND CORONARY ANGIOGRAPHY;  Surgeon: Tonny Bollman, MD;  Location: [facility] INVASIVE CV LAB;  Service: Cardiovascular;  Laterality: N/A;  . TEE WITHOUT CARDIOVERSION N/A 12/05/2019   Procedure: TRANSESOPHAGEAL ECHOCARDIOGRAM (TEE);  Surgeon: Purcell Nails, MD;  Location: [facility] OR;  Service: Open Heart Surgery;  Laterality: N/A;    Family History  Problem Relation Age of Onset  . Cancer Mother        Brain  . Heart disease Father   .  Hyperlipidemia Father   . Hypertension Father   . Stroke Father   . Heart murmur Sister     Social History   Socioeconomic History  . Marital status: Single    Spouse name: Not on file  . Number of children: Not on file  . Years of education: Not on file  . Highest education level: Not on file  Occupational History    Comment: Glass blower/designer- self-employed  Tobacco Use  . Smoking status: Never Smoker  . Smokeless tobacco: Former Neurosurgeon    Types: Engineer, drilling  . Vaping Use: Never used  Substance and Sexual Activity  . Alcohol use: Not Currently    Comment: occasionally  .  Drug use: No  . Sexual activity: Yes    Birth control/protection: None  Other Topics Concern  . Not on file  Social History Narrative  . Not on file   Social Determinants of Health   Financial Resource Strain: Not on file  Food Insecurity: Not on file  Transportation Needs: Not on file  Physical Activity: Not on file  Stress: Not on file  Social Connections: Not on file  Intimate Partner Violence: Not on file    Outpatient Medications Prior to Visit  Medication Sig Dispense Refill  . insulin glargine (LANTUS SOLOSTAR) 100 UNIT/ML Solostar Pen Inject 30 Units into the skin at bedtime. 15 mL   . JANUMET 50-500 MG tablet TAKE 1 Tablet  BY MOUTH TWICE DAILY WITH A MEAL 180 tablet 1  . lisinopril (ZESTRIL) 20 MG tablet TAKE 1 Tablet BY MOUTH ONCE EVERY DAY 90 tablet 1  . metoprolol tartrate (LOPRESSOR) 25 MG tablet TAKE 1 Tablet  BY MOUTH TWICE DAILY 180 tablet 1  . sertraline (ZOLOFT) 50 MG tablet Take 1 tablet (50 mg total) by mouth daily. 30 tablet 3  . warfarin (COUMADIN) 10 MG tablet Take 1 tablet daily except 1 1/2 tablets on Mondays, Wednesdays and Fridays or as directed 45 tablet 6  . atorvastatin (LIPITOR) 40 MG tablet TAKE 1 Tablet BY MOUTH ONCE EVERY DAY 90 tablet 0   No facility-administered medications prior to visit.    Allergies  Allergen Reactions  . Pollen Extract Other (See Comments)    Runny nose, watery eyes, sneezing    ROS Review of Systems  Constitutional: Negative.   HENT: Negative.   Eyes: Negative.   Respiratory: Negative.   Cardiovascular: Negative.   Gastrointestinal: Negative.   Endocrine: Negative.   Genitourinary: Negative.   Musculoskeletal: Negative.   Skin: Negative.   Allergic/Immunologic: Negative.   Neurological: Negative.   Hematological: Negative.   Psychiatric/Behavioral: Negative.       Objective:    Physical Exam Constitutional:      Appearance: Normal appearance. He is obese.  HENT:     Head: Normocephalic and  atraumatic.     Right Ear: Tympanic membrane, ear canal and external ear normal.     Left Ear: Tympanic membrane, ear canal and external ear normal.     Nose: Nose normal.     Mouth/Throat:     Mouth: Mucous membranes are moist.     Pharynx: Oropharynx is clear.  Eyes:     Extraocular Movements: Extraocular movements intact.     Conjunctiva/sclera: Conjunctivae normal.     Pupils: Pupils are equal, round, and reactive to light.  Cardiovascular:     Rate and Rhythm: Normal rate and regular rhythm.     Pulses:  Dorsalis pedis pulses are 1+ on the right side and 1+ on the left side.     Comments: Mechanical click Pulmonary:     Effort: Pulmonary effort is normal.     Breath sounds: Normal breath sounds.  Abdominal:     General: Bowel sounds are normal.     Palpations: Abdomen is soft.  Musculoskeletal:        General: No swelling, tenderness or signs of injury.     Cervical back: Normal range of motion and neck supple.     Comments: Amputation of left 4th and 5th toes     Left Lower Extremity: (4th and 5th toe) Feet:     Right foot:     Protective Sensation: 10 sites tested. 7 sites sensed.     Skin integrity: Skin integrity normal.     Toenail Condition: Right toenails are long.     Left foot:     Protective Sensation: 10 sites tested. 3 sites sensed.     Skin integrity: Skin integrity normal.     Toenail Condition: Left toenails are long.  Skin:    General: Skin is warm and dry.     Capillary Refill: Capillary refill takes less than 2 seconds.  Neurological:     Mental Status: He is alert and oriented to person, place, and time.     Sensory: Sensory deficit present.     Comments: To feet  Psychiatric:        Mood and Affect: Mood normal.        Behavior: Behavior normal.        Thought Content: Thought content normal.     BP 136/80 (BP Location: Right Arm, Patient Position: Sitting, Cuff Size: Large)   Pulse 72   Temp 98 F (36.7 C) (Temporal)   Ht 5\' 10"   (1.778 m)   Wt (!) 357 lb (161.9 kg)   SpO2 98%   BMI 51.22 kg/m  Wt Readings from Last 3 Encounters:  11/04/20 (!) 357 lb (161.9 kg)  09/30/20 (!) 356 lb (161.5 kg)  09/09/20 (!) 351 lb 12.8 oz (159.6 kg)     Health Maintenance Due  Topic Date Due  . PNEUMOCOCCAL POLYSACCHARIDE VACCINE AGE 42-64 HIGH RISK  Never done  . FOOT EXAM  07/01/2020    There are no preventive care reminders to display for this patient.  Lab Results  Component Value Date   TSH 2.25 06/13/2020   Lab Results  Component Value Date   WBC 9.0 10/31/2020   HGB 13.2 10/31/2020   HCT 39.6 10/31/2020   MCV 84 10/31/2020   PLT 259 10/31/2020   Lab Results  Component Value Date   NA 134 10/31/2020   K 5.2 10/31/2020   CO2 25 10/31/2020   GLUCOSE 95 10/31/2020   BUN 15 10/31/2020   CREATININE 0.80 10/31/2020   BILITOT 1.1 10/31/2020   ALKPHOS 70 10/31/2020   AST 20 10/31/2020   ALT 24 10/31/2020   PROT 6.6 10/31/2020   ALBUMIN 4.2 10/31/2020   CALCIUM 9.3 10/31/2020   ANIONGAP 11 07/17/2020   Lab Results  Component Value Date   CHOL 151 10/31/2020   Lab Results  Component Value Date   HDL 48 10/31/2020   Lab Results  Component Value Date   LDLCALC 83 10/31/2020   Lab Results  Component Value Date   TRIG 111 10/31/2020   Lab Results  Component Value Date   CHOLHDL 3.4 07/17/2020   Lab Results  Component Value Date   HGBA1C 6.0 (A) 06/19/2020      Assessment & Plan:   Problem List Items Addressed This Visit      Cardiovascular and Mediastinum   HTN (hypertension)    -well controlled -continue lisinopril and metoprolol      Relevant Medications   atorvastatin (LIPITOR) 80 MG tablet     Endocrine   Uncontrolled type 2 diabetes mellitus with complication (HCC)    -A1c to be drawn by endocrinology ([place]) -taking lantus 30 units daily -takes Janumet (sitagliptin-metformin) 50-500 mg BID -foot exam performed today -he will set up appt. with podiatrist for toenail  trimming      Relevant Medications   atorvastatin (LIPITOR) 80 MG tablet     Other   Hyperlipidemia - Primary    Lab Results  Component Value Date   CHOL 151 10/31/2020   HDL 48 10/31/2020   LDLCALC 83 10/31/2020   TRIG 111 10/31/2020   CHOLHDL 3.4 07/17/2020  -goal LDL < 70 d/t diabetes -INCREASE atorvastatin to 80 mg       Relevant Medications   atorvastatin (LIPITOR) 80 MG tablet   S/P aortic valve replacement with mechanical valve    -uses warfarin for anticoagulation -Dr. Diona Browner manages anticoagulation -goal INR 2-3      Preventative health care    -physical exam with diabetic foot exam performed today       Anxiety    -he was taking lexapro in the past, but was swapped to sertraline a month ago -he was having panic attacks      Status post amputation of toe of left foot (HCC)   Immunization due    -pneumovax administered today         Meds ordered this encounter  Medications  . atorvastatin (LIPITOR) 80 MG tablet    Sig: Take 1 tablet (80 mg total) by mouth daily.    Dispense:  90 tablet    Refill:  3    Follow-up: Return in about 3 months (around 02/01/2021) for Lab follow-up (atorvastatin change).    Heather Roberts, NP

## 2020-11-04 NOTE — Assessment & Plan Note (Signed)
-  well controlled -continue lisinopril and metoprolol

## 2020-11-04 NOTE — Patient Instructions (Signed)
Continue warfarin 1 tablet daily except 1 1/2 tablets on Mondays, Wednesdays and Fridays °Recheck in 6 wks °

## 2020-11-04 NOTE — Assessment & Plan Note (Signed)
-  he was taking lexapro in the past, but was swapped to sertraline a month ago -he was having panic attacks

## 2020-11-04 NOTE — Assessment & Plan Note (Addendum)
Lab Results  Component Value Date   CHOL 151 10/31/2020   HDL 48 10/31/2020   LDLCALC 83 10/31/2020   TRIG 111 10/31/2020   CHOLHDL 3.4 07/17/2020  -goal LDL < 70 d/t diabetes -INCREASE atorvastatin to 80 mg

## 2020-11-04 NOTE — Assessment & Plan Note (Signed)
-  uses warfarin for anticoagulation -Dr. Diona Browner manages anticoagulation -goal INR 2-3

## 2020-11-04 NOTE — Addendum Note (Signed)
Addended by: Jerilynn Mages on: 11/04/2020 09:52 AM   Modules accepted: Orders

## 2020-11-04 NOTE — Assessment & Plan Note (Signed)
-  physical exam with diabetic foot exam performed today

## 2020-11-04 NOTE — Patient Instructions (Signed)
We will see you back in 3 months for a lab follow-up. We will need fasting labs drawn 2-3 days prior to your appointment so we can evaluate the changes from increasing your atorvastatin.  Please see a podiatrist to address toenail related issues. I've had patients see Dr. Nolen Mu in town, and they like him. I don't think that a referral is required for this, but if your insurance requires a referral, please let me know, and I'd be glad to send in a referral for you.

## 2020-11-17 ENCOUNTER — Ambulatory Visit: Payer: Self-pay | Admitting: Physician Assistant

## 2020-12-08 ENCOUNTER — Encounter: Payer: Self-pay | Admitting: Thoracic Surgery (Cardiothoracic Vascular Surgery)

## 2020-12-08 ENCOUNTER — Ambulatory Visit (INDEPENDENT_AMBULATORY_CARE_PROVIDER_SITE_OTHER): Payer: PRIVATE HEALTH INSURANCE | Admitting: Thoracic Surgery (Cardiothoracic Vascular Surgery)

## 2020-12-08 ENCOUNTER — Other Ambulatory Visit: Payer: Self-pay

## 2020-12-08 VITALS — BP 149/86 | HR 81 | Temp 98.1°F | Resp 20 | Ht 70.0 in | Wt 356.4 lb

## 2020-12-08 DIAGNOSIS — Z954 Presence of other heart-valve replacement: Secondary | ICD-10-CM

## 2020-12-08 NOTE — Patient Instructions (Signed)
Continue all previous medications without any changes at this time  

## 2020-12-08 NOTE — Progress Notes (Signed)
      [address]       [name] [address]             [phone]     CARDIOTHORACIC SURGERY OFFICE NOTE  Primary Cardiologist is Nona Dell, MD PCP is Heather Roberts, NP   HPI:  Patient is a 45 year old morbidly obese male who returns to the office today for routine follow-up status post aortic valve replacement using a mechanical prosthetic valve on December 05, 2019 via partial upper mini sternotomy.  His postoperative recovery was uneventful and he underwent routine follow-up echocardiogram January 22, 2020 which revealed normal functioning bileaflet mechanical valve with normal left ventricular systolic function.  Patient returns to the office today and reports that he is doing very well.  Overall he states that he feels "much improved" in comparison with how he felt prior to surgery.  He states that he has no symptoms of exertional shortness of breath or chest discomfort whatsoever.  He is back to normal physical activity and he is very pleased with his outcome.  He states that in retrospect he did not realize how much trouble he was having prior to surgery.  He has not had any problems with long-term anticoagulation using warfarin.   Current Outpatient Medications  Medication Sig Dispense Refill  . atorvastatin (LIPITOR) 80 MG tablet Take 1 tablet (80 mg total) by mouth daily. 90 tablet 3  . insulin glargine (LANTUS SOLOSTAR) 100 UNIT/ML Solostar Pen Inject 30 Units into the skin at bedtime. 15 mL   . JANUMET 50-500 MG tablet TAKE 1 Tablet  BY MOUTH TWICE DAILY WITH A MEAL 180 tablet 1  . lisinopril (ZESTRIL) 20 MG tablet TAKE 1 Tablet BY MOUTH ONCE EVERY DAY 90 tablet 1  . metoprolol tartrate (LOPRESSOR) 25 MG tablet TAKE 1 Tablet  BY MOUTH TWICE DAILY 180 tablet 1  . sertraline (ZOLOFT) 50 MG tablet Take 1 tablet (50 mg total) by mouth daily. 30 tablet 3  . warfarin (COUMADIN) 10 MG tablet Take 1 tablet daily except 1 1/2 tablets on Mondays, Wednesdays and  Fridays or as directed 45 tablet 6   No current facility-administered medications for this visit.      Physical Exam:   BP (!) 149/86 (BP Location: Right Arm, Patient Position: Sitting, Cuff Size: Normal)   Pulse 81   Temp 98.1 F (36.7 C) (Skin)   Resp 20   Ht 5\' 10"  (1.778 m)   Wt (!) 356 lb 6.4 oz (161.7 kg)   SpO2 97% Comment: RA  BMI 51.14 kg/m   General:  Well-appearing  Chest:   Clear to auscultation  CV:   Regular rate and rhythm with mechanical heart valve sounds  Incisions:  Completely healed  Abdomen:  Soft nontender  Extremities:  Warm and well-perfused  Diagnostic Tests:  n/a   Impression:  Patient is doing well approximately 1 year status post aortic valve replacement using a mechanical prosthetic valve  Plan:  We have not recommended any changes to patient's current medications.  The patient will continue to follow-up intermittently with Dr. .  In the future he will call and return to see Diona Browner only as needed.  I spent in excess of 15 minutes during the conduct of this office consultation and >50% of this time involved direct face-to-face encounter with the patient for counseling and/or coordination of their care.    [place]. Salvatore Decent, MD 12/08/2020 11:12 AM

## 2020-12-11 ENCOUNTER — Other Ambulatory Visit: Payer: Self-pay

## 2020-12-11 DIAGNOSIS — IMO0002 Reserved for concepts with insufficient information to code with codable children: Secondary | ICD-10-CM

## 2020-12-11 DIAGNOSIS — E1165 Type 2 diabetes mellitus with hyperglycemia: Secondary | ICD-10-CM

## 2020-12-15 ENCOUNTER — Other Ambulatory Visit: Payer: Self-pay

## 2020-12-15 ENCOUNTER — Encounter: Payer: Self-pay | Admitting: Nutrition

## 2020-12-15 ENCOUNTER — Encounter: Payer: 59 | Attending: "Endocrinology | Admitting: Nutrition

## 2020-12-15 DIAGNOSIS — IMO0002 Reserved for concepts with insufficient information to code with codable children: Secondary | ICD-10-CM

## 2020-12-15 DIAGNOSIS — E782 Mixed hyperlipidemia: Secondary | ICD-10-CM | POA: Insufficient documentation

## 2020-12-15 DIAGNOSIS — E1165 Type 2 diabetes mellitus with hyperglycemia: Secondary | ICD-10-CM | POA: Diagnosis present

## 2020-12-15 DIAGNOSIS — E118 Type 2 diabetes mellitus with unspecified complications: Secondary | ICD-10-CM | POA: Diagnosis present

## 2020-12-15 DIAGNOSIS — I10 Essential (primary) hypertension: Secondary | ICD-10-CM | POA: Insufficient documentation

## 2020-12-15 NOTE — Patient Instructions (Signed)
  Goals  Walk 30 mintues a day Lose 1 lb per week Cut out 250 calories of snacks, condiments. And other processed foods. Keep drinking water a gallon, Avoid diet sodas and flavor packets Pack meals ahead for work. Don't skip meals.

## 2020-12-15 NOTE — Progress Notes (Signed)
  Medical Nutrition Therapy:  Appt start time:1145 end time:  1200  Assessment:  Primary concerns today: Diabetes Type 2 and Obesity. A1C 6% .Originally it was 11.5%. FBS: 90-120's  Evenings before supper 80-115 mg/dl  Lantus 30 units at night. Is on Zoloft and feels  much better.  Back to walking 2 miles per day.  PCP: [facility]: Mr. Wallace Cullens FNP Eating more fresh fruits and vegetables. Cooks most meals at home. Just got a new job and will be walking a lot more on his job.  CMP Latest Ref Rng & Units 10/31/2020 07/17/2020 06/13/2020  Glucose 65 - 99 mg/dL 95 381(W) 299(B)  BUN 6 - 24 mg/dL 15 15 16   Creatinine 0.76 - 1.27 mg/dL 7.16 9.67  Sodium 134 - 144 mmol/L 134 136 134(L)  Potassium 3.5 - 5.2 mmol/L 5.2 5.0 4.5  Chloride 96 - 106 mmol/L 97 98 98  CO2 20 - 29 mmol/L 25 27 28   Calcium 8.7 - 10.2 mg/dL 9.3 9.6 9.5  Total Protein 6.0 - 8.5 g/dL 6.6 7.4 6.9  Total Bilirubin 0.0 - 1.2 mg/dL 1.1 1.2 1.2  Alkaline Phos 44 - 121 IU/L 70 51 -  AST 0 - 40 IU/L 20 24 19   ALT 0 - 44 IU/L 24 25 20    Lab Results  Component Value Date   HGBA1C 6.0 (A) 06/19/2020   Lipid Panel     Component Value Date/Time   CHOL 151 10/31/2020 0944   TRIG 111 10/31/2020 0944   HDL 48 10/31/2020 0944   CHOLHDL 3.4 07/17/2020 0837   VLDL 14 07/17/2020 0837   LDLCALC 83 10/31/2020 0944   LABVLDL 20 10/31/2020 0944    Preferred Learning Style:   No preference indicated   Learning Readiness:  Ready  Change in progress  MEDICATIONS:    DIETARY INTAKE:  24-hr recall:  B ( AM): 2 eggs, 1 slice toast,  L ( PM):  Chicken, Diet Coke D  Steak, salad, Water  Usual physical activity: walking   Estimated energy needs: 1800 calories 200 g carbohydrates 135 g protein 50 g fat  Progress Towards Goal(s):  In progress.   Nutritional Diagnosis:  NB-1.1 Food and nutrition-related knowledge deficit As related to Diabetes and Obesity.  As evidenced by A1C 11% and BMI > 40.     Intervention:  Nutrition and Diabetes education provided on My Plate, CHO counting, meal planning, portion sizes, timing of meals, avoiding snacks between meals unless having a low blood sugar, target ranges for A1C and blood sugars, signs/symptoms and treatment of hyper/hypoglycemia, monitoring blood sugars, taking medications as prescribed, benefits of exercising 30 minutes per day and prevention of complications of DM.  Goals  Walk 30 mintues a day Lose 1 lb per week Cut out 250 calories of snacks, condiments. And other processed foods. Keep drinking water a gallon, Avoid diet sodas and flavor packets Pack meals ahead for work. Don't skip meals.   Teaching Method Utilized:  Visual Auditory Hands on  Handouts given during visit include:  The Plate Method  Meal Plan  Low Cholesterol Diet  Low Salt     Barriers to learning/adherence to lifestyle change: Heart condition   Demonstrated degree of understanding via:  Teach Back   Monitoring/Evaluation:  Dietary intake, exercise, , and body weight in 3-4 month(s).

## 2020-12-16 ENCOUNTER — Ambulatory Visit (INDEPENDENT_AMBULATORY_CARE_PROVIDER_SITE_OTHER): Payer: 59 | Admitting: *Deleted

## 2020-12-16 DIAGNOSIS — Z952 Presence of prosthetic heart valve: Secondary | ICD-10-CM

## 2020-12-16 DIAGNOSIS — Z5181 Encounter for therapeutic drug level monitoring: Secondary | ICD-10-CM | POA: Diagnosis not present

## 2020-12-16 LAB — POCT INR: INR: 3.9 — AB (ref 2.0–3.0)

## 2020-12-16 NOTE — Patient Instructions (Signed)
Hold warfarin tonight then decrease dose to 1 tablet daily except 1 1/2 tablets on Mondays  ?Recheck in 3 wks ?

## 2020-12-17 ENCOUNTER — Encounter: Payer: Self-pay | Admitting: Nurse Practitioner

## 2020-12-17 ENCOUNTER — Other Ambulatory Visit: Payer: Self-pay

## 2020-12-17 ENCOUNTER — Ambulatory Visit (INDEPENDENT_AMBULATORY_CARE_PROVIDER_SITE_OTHER): Payer: Self-pay | Admitting: Nurse Practitioner

## 2020-12-17 ENCOUNTER — Ambulatory Visit: Payer: Self-pay | Admitting: Nutrition

## 2020-12-17 VITALS — BP 138/83 | HR 75 | Ht 70.0 in | Wt 357.0 lb

## 2020-12-17 DIAGNOSIS — I10 Essential (primary) hypertension: Secondary | ICD-10-CM

## 2020-12-17 DIAGNOSIS — E782 Mixed hyperlipidemia: Secondary | ICD-10-CM

## 2020-12-17 DIAGNOSIS — E1165 Type 2 diabetes mellitus with hyperglycemia: Secondary | ICD-10-CM

## 2020-12-17 DIAGNOSIS — E118 Type 2 diabetes mellitus with unspecified complications: Secondary | ICD-10-CM

## 2020-12-17 DIAGNOSIS — IMO0002 Reserved for concepts with insufficient information to code with codable children: Secondary | ICD-10-CM

## 2020-12-17 LAB — POCT GLYCOSYLATED HEMOGLOBIN (HGB A1C): HbA1c, POC (controlled diabetic range): 6 % (ref 0.0–7.0)

## 2020-12-17 MED ORDER — LANTUS SOLOSTAR 100 UNIT/ML ~~LOC~~ SOPN: 30.0000 [IU] | PEN_INJECTOR | Freq: Every day | SUBCUTANEOUS | 3 refills | Status: DC

## 2020-12-17 MED ORDER — JANUMET 50-500 MG PO TABS: 1.0000 | ORAL_TABLET | Freq: Two times a day (BID) | ORAL | 3 refills | Status: DC

## 2020-12-17 NOTE — Patient Instructions (Signed)

## 2020-12-17 NOTE — Progress Notes (Signed)
12/17/2020  Endocrinology follow-up note   Subjective:    Patient ID: Adam Long, male    DOB: 1976/03/24. Patient is being seen in follow-up for management of currently uncontrolled, complicated type 2 diabetes . PCP:   Heather Roberts, NP   Past Medical History:  Diagnosis Date  . Anxiety   . Aortic stenosis   . Cellulitis and abscess of lower extremity 06/11/2019  . Cellulitis of fourth toe of left foot   . Cholelithiasis   . Coronary artery disease    Nonobstructive CAD (40-50% LAD) 08/2019  . Depression   . Depression    Phreesia 09/27/2020  . Depression    Phreesia 11/01/2020  . Diabetes mellitus without complication (HCC)    Phreesia 09/27/2020  . Elevated troponin level not due myocardial infarction 11/11/2019  . Essential hypertension   . Gangrene of toe of left foot (HCC) 07/06/2019  . Heart murmur    Phreesia 09/27/2020  . Hyperlipidemia    Phreesia 09/27/2020  . Hypertension    Phreesia 09/27/2020  . Mixed hyperlipidemia   . Morbid obesity (HCC)   . S/P aortic valve replacement with mechanical valve 12/05/2019   25 mm Carbomedics top hat bileaflet mechanical valve via partial upper hemi-sternotomy  . Severe aortic stenosis 09/24/2019  . Type 2 diabetes mellitus (HCC)    Past Surgical History:  Procedure Laterality Date  . ABDOMINAL AORTOGRAM W/LOWER EXTREMITY N/A 07/06/2019   Procedure: ABDOMINAL AORTOGRAM W/LOWER EXTREMITY;  Surgeon: Sherren Kerns, MD;  Location: [facility] INVASIVE CV LAB;  Service: Cardiovascular;  Laterality: N/A;  Bilateral  . AMPUTATION Left 07/09/2019   Procedure: LEFT FOURTH and Fifth TOE AMPUTATION.;  Surgeon: Larina Earthly, MD;  Location: [facility] OR;  Service: Vascular;  Laterality: Left;  . AORTIC VALVE REPLACEMENT N/A 12/05/2019   Procedure: PARTIAL STERNOTOMY FOR AORTIC VALVE REPLACEMENT (AVR), USING CARBOMEDICS SUPRA-ANNULAR TOP HAT ;  Surgeon: Purcell Nails, MD;  Location: [facility] OR;  Service: Open Heart Surgery;  Laterality: N/A;   No neck lines on left  . CARDIAC VALVE REPLACEMENT N/A    Phreesia 09/27/2020  . IR RADIOLOGY PERIPHERAL GUIDED IV START  10/05/2019  . IR US GUIDE VASC ACCESS RIGHT  10/05/2019  . MULTIPLE EXTRACTIONS WITH ALVEOLOPLASTY N/A 10/26/2019   Procedure: EXTRACTION OF TOOTH #'S 3, 5-11,19-28,  AND 32 WITH ALVEOLOPLASTY;  Surgeon: Charlynne Pander, DDS;  Location: [facility] OR;  Service: Oral Surgery;  Laterality: N/A;  . RIGHT HEART CATH AND CORONARY ANGIOGRAPHY N/A 09/24/2019   Procedure: RIGHT HEART CATH AND CORONARY ANGIOGRAPHY;  Surgeon: Tonny Bollman, MD;  Location: [facility] INVASIVE CV LAB;  Service: Cardiovascular;  Laterality: N/A;  . TEE WITHOUT CARDIOVERSION N/A 12/05/2019   Procedure: TRANSESOPHAGEAL ECHOCARDIOGRAM (TEE);  Surgeon: Purcell Nails, MD;  Location: [facility] OR;  Service: Open Heart Surgery;  Laterality: N/A;   Social History   Socioeconomic History  . Marital status: Single    Spouse name: Not on file  . Number of children: Not on file  . Years of education: Not on file  . Highest education level: Not on file  Occupational History    Comment: Glass blower/designer- self-employed  Tobacco Use  . Smoking status: Never Smoker  . Smokeless tobacco: Former Neurosurgeon    Types: Engineer, drilling  . Vaping Use: Never used  Substance and Sexual Activity  . Alcohol use: Not Currently    Comment: occasionally  . Drug use: No  . Sexual activity: Yes  Birth control/protection: None  Other Topics Concern  . Not on file  Social History Narrative  . Not on file   Social Determinants of Health   Financial Resource Strain: Not on file  Food Insecurity: Not on file  Transportation Needs: Not on file  Physical Activity: Not on file  Stress: Not on file  Social Connections: Not on file   Outpatient Encounter Medications as of 12/17/2020  Medication Sig  . atorvastatin (LIPITOR) 80 MG tablet Take 1 tablet (80 mg total) by mouth daily.  Marland Kitchen. lisinopril (ZESTRIL) 20 MG tablet TAKE 1 Tablet BY MOUTH  ONCE EVERY DAY  . metoprolol tartrate (LOPRESSOR) 25 MG tablet TAKE 1 Tablet  BY MOUTH TWICE DAILY  . sertraline (ZOLOFT) 50 MG tablet Take 1 tablet (50 mg total) by mouth daily.  Marland Kitchen. warfarin (COUMADIN) 10 MG tablet Take 1 tablet daily except 1 1/2 tablets on Mondays, Wednesdays and Fridays or as directed  . [DISCONTINUED] insulin glargine (LANTUS SOLOSTAR) 100 UNIT/ML Solostar Pen Inject 30 Units into the skin at bedtime.  . [DISCONTINUED] JANUMET 50-500 MG tablet TAKE 1 Tablet  BY MOUTH TWICE DAILY WITH A MEAL  . insulin glargine (LANTUS SOLOSTAR) 100 UNIT/ML Solostar Pen Inject 30 Units into the skin at bedtime.  . sitaGLIPtin-metformin (JANUMET) 50-500 MG tablet Take 1 tablet by mouth 2 (two) times daily with a meal.   No facility-administered encounter medications on file as of 12/17/2020.   ALLERGIES: Allergies  Allergen Reactions  . Pollen Extract Other (See Comments)    Runny nose, watery eyes, sneezing   VACCINATION STATUS: Immunization History  Administered Date(s) Administered  . Influenza Split 07/01/2015, 06/30/2016  . Influenza,inj,Quad PF,6+ Mos 06/30/2017, 07/30/2018, 06/12/2019, 06/07/2020  . Moderna Sars-Covid-2 Vaccination 01/03/2020, 02/05/2020, 09/17/2020  . Pneumococcal Polysaccharide-23 11/04/2020  . Tdap 06/11/2019    Diabetes He presents for his follow-up diabetic visit. He has type 2 diabetes mellitus. Onset time: He was diagnosed at approximate age of 35 years. His disease course has been stable. There are no hypoglycemic associated symptoms. Pertinent negatives for hypoglycemia include no confusion, headaches, pallor or seizures. Pertinent negatives for diabetes include no blurred vision, no chest pain, no fatigue, no polydipsia, no polyphagia, no polyuria and no weakness. There are no hypoglycemic complications. Symptoms are stable. Diabetic complications include heart disease and PVD. (Peripheral artery disease complicated by partial amputation of left foot.   Since his last visit, he underwent aortic valve replacement.) Risk factors for coronary artery disease include diabetes mellitus, dyslipidemia, family history, obesity, male sex, hypertension and sedentary lifestyle. Current diabetic treatment includes oral agent (dual therapy) and insulin injections. He is compliant with treatment all of the time. His weight is stable. He is following a generally healthy diet. When asked about meal planning, he reported none. He has not had a previous visit with a dietitian. He never participates in exercise. His home blood glucose trend is fluctuating minimally. His breakfast blood glucose range is generally 90-110 mg/dl. (He presents today with his meter, no logs, showing stable glycemic profile.  His POCT A1c today is 6%, unchanged from last visit.  He denies any significant hypoglycemia, says he did have a few days where his meal timing was off and he started to get shaky but nothing severe.  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 ) An ACE inhibitor/angiotensin II receptor blocker is being taken. He sees a podiatrist.Eye exam is current.  Hyperlipidemia This is a chronic problem. The current  episode started more than 1 year ago. The problem is controlled. Recent lipid tests were reviewed and are normal. Exacerbating diseases include diabetes and obesity. Pertinent negatives include no chest pain, myalgias or shortness of breath. Current antihyperlipidemic treatment includes statins. The current treatment provides moderate improvement of lipids. Compliance problems include adherence to exercise.  Risk factors for coronary artery disease include diabetes mellitus, dyslipidemia, hypertension, male sex, obesity, a sedentary lifestyle and family history.  Hypertension This is a chronic problem. The current episode started more than 1 year ago. The problem has been gradually improving since onset. The problem is controlled. Pertinent negatives include no  blurred vision, chest pain, headaches, neck pain, palpitations or shortness of breath. There are no associated agents to hypertension. Risk factors for coronary artery disease include diabetes mellitus, dyslipidemia, male gender, sedentary lifestyle and obesity. Past treatments include beta blockers and ACE inhibitors. The current treatment provides moderate improvement. There are no compliance problems.  Hypertensive end-organ damage includes PVD.   Review of systems  Constitutional: + Minimally fluctuating body weight,  current Body mass index is 51.22 kg/m. , no fatigue, no subjective hyperthermia, no subjective hypothermia Eyes: no blurry vision, no xerophthalmia ENT: no sore throat, no nodules palpated in throat, no dysphagia/odynophagia, no hoarseness Cardiovascular: no chest pain, no shortness of breath, no palpitations, no leg swelling Respiratory: no cough, no shortness of breath Gastrointestinal: no nausea/vomiting/diarrhea Musculoskeletal: no muscle/joint aches Skin: no rashes, no hyperemia Neurological: no tremors, no numbness, no tingling, no dizziness Psychiatric: no depression, no anxiety  Objective:    BP 138/83 (BP Location: Left Arm, Patient Position: Sitting)   Pulse 75   Ht  (1.778 m)   Wt (!) 357 lb (161.9 kg)   BMI 51.22 kg/m   Wt Readings from Last 3 Encounters:  12/17/20 (!) 357 lb (161.9 kg)  12/15/20 (!) 356 lb (161.5 kg)  12/08/20 (!) 356 lb 6.4 oz (161.7 kg)    BP Readings from Last 3 Encounters:  12/17/20 138/83  12/08/20 (!) 149/86  11/04/20 136/80    Physical Exam- Limited  Constitutional:  Body mass index is 51.22 kg/m. , not in acute distress, normal state of mind Eyes:  EOMI, no exophthalmos Neck: Supple Cardiovascular: RRR, no murmers, rubs, or gallops, no edema Respiratory: Adequate breathing efforts, no crackles, rales, rhonchi, or wheezing Musculoskeletal: no gross deformities, strength intact in all four extremities, no gross  restriction of joint movements, partial amputation of left foot Skin:  no rashes, no hyperemia, very little hair noted to entire body Neurological: no tremor with outstretched hands   CMP ( most recent) CMP     Component Value Date/Time   NA 134 10/31/2020 0944   K 5.2 10/31/2020 0944   CL 97 10/31/2020 0944   CO2 25 10/31/2020 0944   GLUCOSE 95 10/31/2020 0944   GLUCOSE 115 (H) 07/17/2020 0837   BUN 15 10/31/2020 0944   CREATININE 0.80 10/31/2020 0944   CREATININE 0.75 06/13/2020 0837   CALCIUM 9.3 10/31/2020 0944   PROT 6.6 10/31/2020 0944   ALBUMIN 4.2 10/31/2020 0944   AST 20 10/31/2020 0944   ALT 24 10/31/2020 0944   ALKPHOS 70 10/31/2020 0944   BILITOT 1.1 10/31/2020 0944   GFRNONAA 109 10/31/2020 0944   GFRNONAA >60 07/17/2020 0837   GFRNONAA 112 06/13/2020 0837   GFRAA 126 10/31/2020 0944   GFRAA 129 06/13/2020 0837     Diabetic Labs (most recent): Lab Results  Component Value Date   HGBA1C  6.0 12/17/2020   HGBA1C 6.0 (A) 06/19/2020   HGBA1C 6.3 (A) 01/24/2020     Lipid Panel ( most recent) Lipid Panel     Component Value Date/Time   CHOL 151 10/31/2020 0944   TRIG 111 10/31/2020 0944   HDL 48 10/31/2020 0944   CHOLHDL 3.4 07/17/2020 0837   VLDL 14 07/17/2020 0837   LDLCALC 83 10/31/2020 0944      Assessment & Plan:   1) Uncontrolled type 2 diabetes mellitus with complication, unspecified whether long term insulin use (HCC)  - Patient has currently uncontrolled symptomatic type 2 DM since  45 years of age.  He presents today with his meter, no logs, showing stable glycemic profile.  His POCT A1c today is 6%, unchanged from last visit.  He denies any significant hypoglycemia, says he did have a few days where his meal timing was off and he started to get shaky but nothing severe.  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  -He is recent labs are reviewed with him.    His diabetes is recently complicated by peripheral arterial  disease with partial amputation of left foot, obesity/sedentary life, noncompliance/nonadherence, lack of proper insurance for medications and doctors visits and patient remains at a high risk for more acute and chronic complications of diabetes which include CAD, CVA, CKD, retinopathy, and neuropathy. These are all discussed in detail with the patient.  - Nutritional counseling repeated at each appointment due to patients tendency to fall back in to old habits.  - The patient admits there is a room for improvement in their diet and drink choices. -  Suggestion is made for the patient to avoid simple carbohydrates from their diet including Cakes, Sweet Desserts / Pastries, Ice Cream, Soda (diet and regular), Sweet Tea, Candies, Chips, Cookies, Sweet Pastries,  Store Bought Juices, Alcohol in Excess of  1-2 drinks a day, Artificial Sweeteners, Coffee Creamer, and "Sugar-free" Products. This will help patient to have stable blood glucose profile and potentially avoid unintended weight gain.   - I encouraged the patient to switch to  unprocessed or minimally processed complex starch and increased protein intake (animal or plant source), fruits, and vegetables.   - Patient is advised to stick to a routine mealtimes to eat 3 meals  a day and avoid unnecessary snacks ( to snack only to correct hypoglycemia).  - I have approached patient with the following individualized plan to manage diabetes and patient agrees:   -Given his significant response, he will benefit from simplified treatment options.  - He is tolerating his current regimen well and is advised to continue Lantus 30 units SQ daily at bedtime and Janumet 50/500 mg po twice daily with meals.    -He is encouraged to continue monitoring blood glucose at least twice daily, before breakfast and before bed and call the clinic if readings are less than 70 or greater than 200 for 3 tests in a row.  - Patient specific target  A1c;  LDL, HDL,  Triglycerides, were discussed in detail.  2) BP/HTN:  His blood pressure is controlled to target.  He is advised to continue Metoprolol 25 mg po twice daily and Lisinopril 20 mg po daily.  3) Lipids/HPL:   His most recent lipid panel from 10/31/20 showed controlled LDL at 83.  He is advised to continue Atorvastatin 40 mg po daily at bedtime.  Side effects and precautions discussed with him.    4)  Weight/Diet:  His Body mass index is 51.22 kg/m.-clearly complicating his diabetes care.  He is working on losing weight, has lost some but gained some back. He is still  a candidate for major weight loss.  I discussed with him the fact that loss of 5-10 percent of his body weight will impact his diabetes significantly.  He is a candidate for bariatric surgery. We will explore this option later.  CDE Consult has been initiated;  exercise, and detailed carbohydrates information provided.  5) Chronic Care/Health Maintenance: -Patient is on Statin medications and encouraged to continue to follow up with Ophthalmology, Podiatrist at least yearly or according to recommendations, and advised to   stay away from smoking. I have recommended yearly flu vaccine and pneumonia vaccination at least every 5 years; moderate intensity exercise for up to 150 minutes weekly; and  sleep for at least 7 hours a day.  - I advised patient to maintain close follow up with Heather Roberts, NP for primary care needs.  - Time spent on this patient care encounter:  35 min, of which > 50% was spent in  counseling and the rest reviewing his blood glucose logs , discussing his hypoglycemia and hyperglycemia episodes, reviewing his current and  previous labs / studies  ( including abstraction from other facilities) and medications  doses and developing a  long term treatment plan and documenting his care.   Please refer to Patient Instructions for Blood Glucose Monitoring and Insulin/Medications Dosing Guide"  in media tab for additional  information. Please  also refer to " Patient Self Inventory" in the Media  tab for reviewed elements of pertinent patient history.  Adam Long participated in the discussions, expressed understanding, and voiced agreement with the above plans.  All questions were answered to his satisfaction. he is encouraged to contact clinic should he have any questions or concerns prior to his return visit.   Follow up plan: - Return in about 6 months (around 06/19/2021) for Diabetes follow up- A1c and urine micro in office, No previsit labs, Bring glucometer and logs.  Ronny Bacon, [facility] [address] [place], [place] [address] Phone: [phone] Fax: [phone]  12/17/2020, 10:39 AM

## 2021-01-06 ENCOUNTER — Ambulatory Visit (INDEPENDENT_AMBULATORY_CARE_PROVIDER_SITE_OTHER): Payer: 59 | Admitting: *Deleted

## 2021-01-06 DIAGNOSIS — Z952 Presence of prosthetic heart valve: Secondary | ICD-10-CM | POA: Diagnosis not present

## 2021-01-06 DIAGNOSIS — Z5181 Encounter for therapeutic drug level monitoring: Secondary | ICD-10-CM | POA: Diagnosis not present

## 2021-01-06 LAB — POCT INR: INR: 3.7 — AB (ref 2.0–3.0)

## 2021-01-06 NOTE — Patient Instructions (Signed)
Hold warfarin tonight then decrease dose to 1 tablet daily except 1/2 tablet on Mondays and Thursdays Recheck in 3 wks

## 2021-01-07 ENCOUNTER — Telehealth: Payer: Self-pay

## 2021-01-07 ENCOUNTER — Other Ambulatory Visit: Payer: Self-pay

## 2021-01-07 DIAGNOSIS — I10 Essential (primary) hypertension: Secondary | ICD-10-CM

## 2021-01-07 MED ORDER — METOPROLOL TARTRATE 25 MG PO TABS: 25.0000 mg | ORAL_TABLET | Freq: Two times a day (BID) | ORAL | 1 refills | Status: DC

## 2021-01-07 MED ORDER — LISINOPRIL 20 MG PO TABS: ORAL_TABLET | ORAL | 2 refills | Status: DC

## 2021-01-07 NOTE — Telephone Encounter (Signed)
Rx's sent in. °

## 2021-01-07 NOTE — Telephone Encounter (Signed)
Patient called need med refill  metoprolol tartrate (LOPRESSOR) 25 MG tablet   lisinopril (ZESTRIL) 20 MG tablet  Pharmacy [facility]

## 2021-01-28 ENCOUNTER — Ambulatory Visit (INDEPENDENT_AMBULATORY_CARE_PROVIDER_SITE_OTHER): Payer: 59 | Admitting: *Deleted

## 2021-01-28 DIAGNOSIS — Z952 Presence of prosthetic heart valve: Secondary | ICD-10-CM | POA: Diagnosis not present

## 2021-01-28 DIAGNOSIS — Z5181 Encounter for therapeutic drug level monitoring: Secondary | ICD-10-CM

## 2021-01-28 LAB — POCT INR: INR: 2 (ref 2.0–3.0)

## 2021-01-28 NOTE — Patient Instructions (Signed)
Increase warfarin to 1 tablet daily except 1/2 tablet on Tuesdays  Recheck in 4 wks

## 2021-01-30 ENCOUNTER — Other Ambulatory Visit: Payer: Self-pay | Admitting: Nurse Practitioner

## 2021-01-30 DIAGNOSIS — F419 Anxiety disorder, unspecified: Secondary | ICD-10-CM

## 2021-02-02 ENCOUNTER — Other Ambulatory Visit: Payer: Self-pay

## 2021-02-02 ENCOUNTER — Ambulatory Visit (INDEPENDENT_AMBULATORY_CARE_PROVIDER_SITE_OTHER): Payer: 59 | Admitting: Nurse Practitioner

## 2021-02-02 ENCOUNTER — Encounter: Payer: Self-pay | Admitting: Nurse Practitioner

## 2021-02-02 DIAGNOSIS — I10 Essential (primary) hypertension: Secondary | ICD-10-CM

## 2021-02-02 DIAGNOSIS — E782 Mixed hyperlipidemia: Secondary | ICD-10-CM | POA: Diagnosis not present

## 2021-02-02 DIAGNOSIS — M7989 Other specified soft tissue disorders: Secondary | ICD-10-CM | POA: Diagnosis not present

## 2021-02-02 NOTE — Assessment & Plan Note (Signed)
-  rechecked lipids today

## 2021-02-02 NOTE — Progress Notes (Signed)
Acute Office Visit  Subjective:    Patient ID: Adam Long, male    DOB: 25-Jul-1976, 45 y.o.   MRN: [id number]  Chief Complaint  Patient presents with  . Hyperlipidemia    Lab follow up     HPI Patient is in today for lab follow-up. At his last OV, he was started on atorvastatin for HLD. He didn't have labs drawn prior to the appointment.  No acute concerns.  He states he has some left foot/ankle swelling.  He works form [employer] to [employer].    Past Medical History:  Diagnosis Date  . Anxiety   . Aortic stenosis   . Cellulitis and abscess of lower extremity 06/11/2019  . Cellulitis of fourth toe of left foot   . Cholelithiasis   . Coronary artery disease    Nonobstructive CAD (40-50% LAD) 08/2019  . Depression   . Depression    Phreesia 09/27/2020  . Depression    Phreesia 11/01/2020  . Diabetes mellitus without complication ([place])    Phreesia 09/27/2020  . Elevated troponin level not due myocardial infarction 11/11/2019  . Essential hypertension   . Gangrene of toe of left foot ([place]) 07/06/2019  . Heart murmur    Phreesia 09/27/2020  . Hyperlipidemia    Phreesia 09/27/2020  . Hypertension    Phreesia 09/27/2020  . Mixed hyperlipidemia   . Morbid obesity ([place])   . S/P aortic valve replacement with mechanical valve 12/05/2019   25 mm Carbomedics top hat bileaflet mechanical valve via partial upper hemi-sternotomy  . Severe aortic stenosis 09/24/2019  . Type 2 diabetes mellitus ([place])     Past Surgical History:  Procedure Laterality Date  . ABDOMINAL AORTOGRAM W/LOWER EXTREMITY N/A 07/06/2019   Procedure: ABDOMINAL AORTOGRAM W/LOWER EXTREMITY;  Surgeon: Elam Dutch, MD;  Location: [facility];  Service: Cardiovascular;  Laterality: N/A;  Bilateral  . AMPUTATION Left 07/09/2019   Procedure: LEFT FOURTH and Fifth TOE AMPUTATION.;  Surgeon: Rosetta Posner, MD;  Location: [place];  Service: Vascular;  Laterality: Left;  . AORTIC VALVE REPLACEMENT N/A  12/05/2019   Procedure: PARTIAL STERNOTOMY FOR AORTIC VALVE REPLACEMENT (AVR), USING CARBOMEDICS SUPRA-ANNULAR TOP HAT 25MM;  Surgeon: Rexene Alberts, MD;  Location: [place] [facility];  Service: Open Heart Surgery;  Laterality: N/A;  No neck lines on left  . CARDIAC VALVE REPLACEMENT N/A    Phreesia 09/27/2020  . IR RADIOLOGY PERIPHERAL GUIDED IV START  10/05/2019  . IR US GUIDE VASC ACCESS RIGHT  10/05/2019  . MULTIPLE EXTRACTIONS WITH ALVEOLOPLASTY N/A 10/26/2019   Procedure: EXTRACTION OF TOOTH #'S 3, 5-11,19-28,  AND 32 WITH ALVEOLOPLASTY;  Surgeon: Lenn Cal, DDS;  Location: [facility];  Service: Oral Surgery;  Laterality: N/A;  . RIGHT HEART CATH AND CORONARY ANGIOGRAPHY N/A 09/24/2019   Procedure: RIGHT HEART CATH AND CORONARY ANGIOGRAPHY;  Surgeon: Sherren Mocha, MD;  Location: [facility];  Service: Cardiovascular;  Laterality: N/A;  . TEE WITHOUT CARDIOVERSION N/A 12/05/2019   Procedure: TRANSESOPHAGEAL ECHOCARDIOGRAM (TEE);  Surgeon: Rexene Alberts, MD;  Location: [place];  Service: Open Heart Surgery;  Laterality: N/A;    Family History  Problem Relation Age of Onset  . Cancer Mother        Brain  . Heart disease Father   . Hyperlipidemia Father   . Hypertension Father   . Stroke Father   . Heart murmur Sister     Social History   Socioeconomic History  .  Marital status: Single    Spouse name: Not on file  . Number of children: Not on file  . Years of education: Not on file  . Highest education level: Not on file  Occupational History    Comment: Neurosurgeon- self-employed  Tobacco Use  . Smoking status: Never Smoker  . Smokeless tobacco: Former Systems developer    Types: Secondary school teacher  . Vaping Use: Never used  Substance and Sexual Activity  . Alcohol use: Not Currently    Comment: occasionally  . Drug use: No  . Sexual activity: Yes    Birth control/protection: None  Other Topics Concern  . Not on file  Social History Narrative  . Not on file   Social  Determinants of Health   Financial Resource Strain: Not on file  Food Insecurity: Not on file  Transportation Needs: Not on file  Physical Activity: Not on file  Stress: Not on file  Social Connections: Not on file  Intimate Partner Violence: Not on file    Outpatient Medications Prior to Visit  Medication Sig Dispense Refill  . atorvastatin (LIPITOR) 80 MG tablet Take 1 tablet (80 mg total) by mouth daily. 90 tablet 3  . insulin glargine (LANTUS SOLOSTAR) 100 UNIT/ML Solostar Pen Inject 30 Units into the skin at bedtime. 30 mL 3  . lisinopril (ZESTRIL) 20 MG tablet TAKE 1 Tablet BY MOUTH ONCE EVERY DAY 90 tablet 2  . metoprolol tartrate (LOPRESSOR) 25 MG tablet Take 1 tablet (25 mg total) by mouth 2 (two) times daily. 180 tablet 1  . sertraline (ZOLOFT) 50 MG tablet Take 1 tablet by mouth once daily 30 tablet 0  . sitaGLIPtin-metformin (JANUMET) 50-500 MG tablet Take 1 tablet by mouth 2 (two) times daily with a meal. 180 tablet 3  . warfarin (COUMADIN) 10 MG tablet Take 1 tablet daily except 1 1/2 tablets on Mondays, Wednesdays and Fridays or as directed 45 tablet 6   No facility-administered medications prior to visit.    Allergies  Allergen Reactions  . Pollen Extract Other (See Comments)    Runny nose, watery eyes, sneezing    Review of Systems  Constitutional: Negative.   Respiratory: Negative.   Cardiovascular: Positive for leg swelling. Negative for chest pain and palpitations.       Mild left leg swelling after working all weekend  Neurological: Negative.   Psychiatric/Behavioral: Negative.        Objective:    Physical Exam Constitutional:      Appearance: Normal appearance.  Cardiovascular:     Rate and Rhythm: Normal rate and regular rhythm.     Pulses: Normal pulses.     Heart sounds: Normal heart sounds.     Comments: Non-pitting edema to left leg Pulmonary:     Effort: Pulmonary effort is normal.     Breath sounds: Normal breath sounds.   Neurological:     Mental Status: He is alert.  Psychiatric:        Mood and Affect: Mood normal.        Behavior: Behavior normal.        Thought Content: Thought content normal.        Judgment: Judgment normal.     BP 117/77   Pulse 90   Temp 98.5 F (36.9 C)   Resp 18   Ht '5\' 10"'  (1.778 m)   Wt (!) 358 lb (162.4 kg)   SpO2 98%   BMI 51.37 kg/m  Wt Readings from  Last 3 Encounters:  02/02/21 (!) 358 lb (162.4 kg)  12/17/20 (!) 357 lb (161.9 kg)  12/15/20 (!) 356 lb (161.5 kg)    Health Maintenance Due  Topic Date Due  . FOOT EXAM  07/01/2020    There are no preventive care reminders to display for this patient.   Lab Results  Component Value Date   TSH 2.25 06/13/2020   Lab Results  Component Value Date   WBC 9.0 10/31/2020   HGB 13.2 10/31/2020   HCT 39.6 10/31/2020   MCV 84 10/31/2020   PLT 259 10/31/2020   Lab Results  Component Value Date   NA 134 10/31/2020   K 5.2 10/31/2020   CO2 25 10/31/2020   GLUCOSE 95 10/31/2020   BUN 15 10/31/2020   CREATININE 0.80 10/31/2020   BILITOT 1.1 10/31/2020   ALKPHOS 70 10/31/2020   AST 20 10/31/2020   ALT 24 10/31/2020   PROT 6.6 10/31/2020   ALBUMIN 4.2 10/31/2020   CALCIUM 9.3 10/31/2020   ANIONGAP 11 07/17/2020   Lab Results  Component Value Date   CHOL 151 10/31/2020   Lab Results  Component Value Date   HDL 48 10/31/2020   Lab Results  Component Value Date   LDLCALC 83 10/31/2020   Lab Results  Component Value Date   TRIG 111 10/31/2020   Lab Results  Component Value Date   CHOLHDL 3.4 07/17/2020   Lab Results  Component Value Date   HGBA1C 6.0 12/17/2020       Assessment & Plan:   Problem List Items Addressed This Visit      Cardiovascular and Mediastinum   HTN (hypertension)    -BP well controlled -checking labs today      Relevant Orders   CBC with Differential/Platelet   CMP14+EGFR   Lipid Panel With LDL/HDL Ratio     Other   Hyperlipidemia    -rechecked  lipids today      Relevant Orders   Lipid Panel With LDL/HDL Ratio   Left leg swelling    -non-pitting edema; wt is stable -no s/s of cellulitis -he is obese and worked all weekend, recommended elevation and rest; he can wear compression stocking or ace wrap while working -if edema is pitting or he has redness/pain, he should return to clinic  Wt Readings from Last 3 Encounters:  02/02/21 (!) 358 lb (162.4 kg)  12/17/20 (!) 357 lb (161.9 kg)  12/15/20 (!) 356 lb (161.5 kg)             No orders of the defined types were placed in this encounter.    Noreene Larsson, NP

## 2021-02-02 NOTE — Patient Instructions (Addendum)
We will draw fasting labs today.  Prior to your next appointment, please have fasting labs drawn 2-3 days prior to your appointment so we can discuss the results during your office visit.

## 2021-02-02 NOTE — Assessment & Plan Note (Signed)
-  BP well controlled -checking labs today

## 2021-02-02 NOTE — Assessment & Plan Note (Signed)
-  non-pitting edema; wt is stable -no s/s of cellulitis -he is obese and worked all weekend, recommended elevation and rest; he can wear compression stocking or ace wrap while working -if edema is pitting or he has redness/pain, he should return to clinic  Wt Readings from Last 3 Encounters:  02/02/21 (!) 358 lb (162.4 kg)  12/17/20 (!) 357 lb (161.9 kg)  12/15/20 (!) 356 lb (161.5 kg)

## 2021-02-03 LAB — CMP14+EGFR
ALT: 23 IU/L (ref 0–44)
AST: 21 IU/L (ref 0–40)
Albumin/Globulin Ratio: 1.2 (ref 1.2–2.2)
Albumin: 4 g/dL (ref 4.0–5.0)
Alkaline Phosphatase: 71 IU/L (ref 44–121)
BUN/Creatinine Ratio: 17 (ref 9–20)
BUN: 13 mg/dL (ref 6–24)
Bilirubin Total: 0.9 mg/dL (ref 0.0–1.2)
CO2: 21 mmol/L (ref 20–29)
Calcium: 9.3 mg/dL (ref 8.7–10.2)
Chloride: 98 mmol/L (ref 96–106)
Creatinine, Ser: 0.78 mg/dL (ref 0.76–1.27)
Globulin, Total: 3.3 g/dL (ref 1.5–4.5)
Glucose: 143 mg/dL — ABNORMAL HIGH (ref 65–99)
Potassium: 5 mmol/L (ref 3.5–5.2)
Sodium: 134 mmol/L (ref 134–144)
Total Protein: 7.3 g/dL (ref 6.0–8.5)
eGFR: 113 mL/min/{1.73_m2} (ref >59)

## 2021-02-03 LAB — CBC WITH DIFFERENTIAL/PLATELET
Basophils Absolute: 0.1 10*3/uL (ref 0.0–0.2)
Basos: 1 %
EOS (ABSOLUTE): 0.2 10*3/uL (ref 0.0–0.4)
Eos: 2 %
Hematocrit: 41.3 % (ref 37.5–51.0)
Hemoglobin: 13.9 g/dL (ref 13.0–17.7)
Immature Grans (Abs): 0.1 10*3/uL (ref 0.0–0.1)
Immature Granulocytes: 1 %
Lymphocytes Absolute: 1.7 10*3/uL (ref 0.7–3.1)
Lymphs: 16 %
MCH: 29 pg (ref 26.6–33.0)
MCHC: 33.7 g/dL (ref 31.5–35.7)
MCV: 86 fL (ref 79–97)
Monocytes Absolute: 0.9 10*3/uL (ref 0.1–0.9)
Monocytes: 8 %
Neutrophils Absolute: 7.8 10*3/uL — ABNORMAL HIGH (ref 1.4–7.0)
Neutrophils: 72 %
Platelets: 249 10*3/uL (ref 150–450)
RBC: 4.8 x10E6/uL (ref 4.14–5.80)
RDW: 13.9 % (ref 11.6–15.4)
WBC: 10.7 10*3/uL (ref 3.4–10.8)

## 2021-02-03 LAB — LIPID PANEL WITH LDL/HDL RATIO
Cholesterol, Total: 141 mg/dL (ref 100–199)
HDL: 41 mg/dL (ref >39)
LDL Chol Calc (NIH): 76 mg/dL (ref 0–99)
LDL/HDL Ratio: 1.9 ratio (ref 0.0–3.6)
Triglycerides: 136 mg/dL (ref 0–149)
VLDL Cholesterol Cal: 24 mg/dL (ref 5–40)

## 2021-02-03 NOTE — Progress Notes (Signed)
Blood sugar was elevated, so I added an A1c to the labs from yesterday.

## 2021-02-05 ENCOUNTER — Other Ambulatory Visit: Payer: Self-pay | Admitting: Nurse Practitioner

## 2021-02-05 LAB — SPECIMEN STATUS REPORT

## 2021-02-05 LAB — HGB A1C W/O EAG: Hgb A1c MFr Bld: 6.5 % — ABNORMAL HIGH (ref 4.8–5.6)

## 2021-02-05 NOTE — Progress Notes (Signed)
A1c is 6.5% which is in the diabetic range. I sent in metformin to take once per day. It usually causes diarrhea for the first week, but then the diarrhea goes away.  This medicine lowers your blood sugar when it is high, but won't lower it if it is normal or low, so you shouldn't have any blood sugars that get too low.

## 2021-02-05 NOTE — Progress Notes (Signed)
Nevermind, he has hx of DM, so he is at goal. No need to add or change medicines.

## 2021-02-12 ENCOUNTER — Encounter (HOSPITAL_COMMUNITY): Payer: Self-pay | Admitting: *Deleted

## 2021-02-12 ENCOUNTER — Ambulatory Visit: Payer: 59 | Admitting: Nurse Practitioner

## 2021-02-12 ENCOUNTER — Other Ambulatory Visit: Payer: Self-pay

## 2021-02-12 ENCOUNTER — Ambulatory Visit
Admission: RE | Admit: 2021-02-12 | Discharge: 2021-02-12 | Payer: 59 | Source: Ambulatory Visit | Attending: Nurse Practitioner | Admitting: Nurse Practitioner

## 2021-02-12 ENCOUNTER — Emergency Department (HOSPITAL_COMMUNITY): Payer: 59

## 2021-02-12 ENCOUNTER — Ambulatory Visit: Payer: 59 | Admitting: Internal Medicine

## 2021-02-12 ENCOUNTER — Inpatient Hospital Stay (HOSPITAL_COMMUNITY)
Admission: EM | Admit: 2021-02-12 | Discharge: 2021-02-17 | DRG: 603 | Disposition: A | Discharge status: Home / Self Care | Payer: 59 | Attending: Internal Medicine | Admitting: Internal Medicine

## 2021-02-12 VITALS — BP 128/77 | HR 92 | Temp 98.6°F | Resp 18

## 2021-02-12 DIAGNOSIS — D72829 Elevated white blood cell count, unspecified: Secondary | ICD-10-CM | POA: Diagnosis not present

## 2021-02-12 DIAGNOSIS — Z794 Long term (current) use of insulin: Secondary | ICD-10-CM

## 2021-02-12 DIAGNOSIS — E66813 Obesity, class 3: Secondary | ICD-10-CM | POA: Diagnosis present

## 2021-02-12 DIAGNOSIS — Z808 Family history of malignant neoplasm of other organs or systems: Secondary | ICD-10-CM | POA: Diagnosis not present

## 2021-02-12 DIAGNOSIS — E10621 Type 1 diabetes mellitus with foot ulcer: Secondary | ICD-10-CM

## 2021-02-12 DIAGNOSIS — Z823 Family history of stroke: Secondary | ICD-10-CM | POA: Diagnosis not present

## 2021-02-12 DIAGNOSIS — Z83438 Family history of other disorder of lipoprotein metabolism and other lipidemia: Secondary | ICD-10-CM

## 2021-02-12 DIAGNOSIS — Z87891 Personal history of nicotine dependence: Secondary | ICD-10-CM | POA: Diagnosis not present

## 2021-02-12 DIAGNOSIS — E872 Acidosis, unspecified: Secondary | ICD-10-CM

## 2021-02-12 DIAGNOSIS — Z6841 Body Mass Index (BMI) 40.0 and over, adult: Secondary | ICD-10-CM | POA: Diagnosis not present

## 2021-02-12 DIAGNOSIS — E11628 Type 2 diabetes mellitus with other skin complications: Secondary | ICD-10-CM | POA: Diagnosis not present

## 2021-02-12 DIAGNOSIS — L0390 Cellulitis, unspecified: Secondary | ICD-10-CM | POA: Diagnosis not present

## 2021-02-12 DIAGNOSIS — E11621 Type 2 diabetes mellitus with foot ulcer: Secondary | ICD-10-CM | POA: Diagnosis present

## 2021-02-12 DIAGNOSIS — S91302A Unspecified open wound, left foot, initial encounter: Secondary | ICD-10-CM | POA: Diagnosis present

## 2021-02-12 DIAGNOSIS — Z954 Presence of other heart-valve replacement: Secondary | ICD-10-CM | POA: Diagnosis not present

## 2021-02-12 DIAGNOSIS — Z952 Presence of prosthetic heart valve: Secondary | ICD-10-CM | POA: Diagnosis not present

## 2021-02-12 DIAGNOSIS — E861 Hypovolemia: Secondary | ICD-10-CM | POA: Diagnosis not present

## 2021-02-12 DIAGNOSIS — L089 Local infection of the skin and subcutaneous tissue, unspecified: Secondary | ICD-10-CM | POA: Diagnosis not present

## 2021-02-12 DIAGNOSIS — L97428 Non-pressure chronic ulcer of left heel and midfoot with other specified severity: Secondary | ICD-10-CM | POA: Diagnosis present

## 2021-02-12 DIAGNOSIS — Z20822 Contact with and (suspected) exposure to covid-19: Secondary | ICD-10-CM | POA: Diagnosis present

## 2021-02-12 DIAGNOSIS — I2510 Atherosclerotic heart disease of native coronary artery without angina pectoris: Secondary | ICD-10-CM | POA: Diagnosis present

## 2021-02-12 DIAGNOSIS — D649 Anemia, unspecified: Secondary | ICD-10-CM | POA: Diagnosis not present

## 2021-02-12 DIAGNOSIS — L97421 Non-pressure chronic ulcer of left heel and midfoot limited to breakdown of skin: Secondary | ICD-10-CM

## 2021-02-12 DIAGNOSIS — Z8249 Family history of ischemic heart disease and other diseases of the circulatory system: Secondary | ICD-10-CM

## 2021-02-12 DIAGNOSIS — E871 Hypo-osmolality and hyponatremia: Secondary | ICD-10-CM

## 2021-02-12 DIAGNOSIS — E782 Mixed hyperlipidemia: Secondary | ICD-10-CM | POA: Diagnosis present

## 2021-02-12 DIAGNOSIS — I10 Essential (primary) hypertension: Secondary | ICD-10-CM | POA: Diagnosis present

## 2021-02-12 DIAGNOSIS — Z79899 Other long term (current) drug therapy: Secondary | ICD-10-CM

## 2021-02-12 DIAGNOSIS — E1169 Type 2 diabetes mellitus with other specified complication: Secondary | ICD-10-CM | POA: Diagnosis present

## 2021-02-12 DIAGNOSIS — L03116 Cellulitis of left lower limb: Principal | ICD-10-CM

## 2021-02-12 DIAGNOSIS — L97521 Non-pressure chronic ulcer of other part of left foot limited to breakdown of skin: Secondary | ICD-10-CM

## 2021-02-12 DIAGNOSIS — Z7984 Long term (current) use of oral hypoglycemic drugs: Secondary | ICD-10-CM

## 2021-02-12 DIAGNOSIS — E1165 Type 2 diabetes mellitus with hyperglycemia: Secondary | ICD-10-CM | POA: Diagnosis present

## 2021-02-12 DIAGNOSIS — E860 Dehydration: Secondary | ICD-10-CM

## 2021-02-12 DIAGNOSIS — E785 Hyperlipidemia, unspecified: Secondary | ICD-10-CM | POA: Diagnosis present

## 2021-02-12 LAB — CBC WITH DIFFERENTIAL/PLATELET
Abs Immature Granulocytes: 0.11 10*3/uL — ABNORMAL HIGH (ref 0.00–0.07)
Basophils Absolute: 0.1 10*3/uL (ref 0.0–0.1)
Basophils Relative: 0 %
Eosinophils Absolute: 0.1 10*3/uL (ref 0.0–0.5)
Eosinophils Relative: 1 %
HCT: 39 % (ref 39.0–52.0)
Hemoglobin: 12.6 g/dL — ABNORMAL LOW (ref 13.0–17.0)
Immature Granulocytes: 1 %
Lymphocytes Relative: 10 %
Lymphs Abs: 1.6 10*3/uL (ref 0.7–4.0)
MCH: 29 pg (ref 26.0–34.0)
MCHC: 32.3 g/dL (ref 30.0–36.0)
MCV: 89.9 fL (ref 80.0–100.0)
Monocytes Absolute: 1.2 10*3/uL — ABNORMAL HIGH (ref 0.1–1.0)
Monocytes Relative: 8 %
Neutro Abs: 12.7 10*3/uL — ABNORMAL HIGH (ref 1.7–7.7)
Neutrophils Relative %: 80 %
Platelets: 262 10*3/uL (ref 150–400)
RBC: 4.34 MIL/uL (ref 4.22–5.81)
RDW: 14.4 % (ref 11.5–15.5)
WBC: 15.7 10*3/uL — ABNORMAL HIGH (ref 4.0–10.5)
nRBC: 0 % (ref 0.0–0.2)

## 2021-02-12 LAB — BASIC METABOLIC PANEL
Anion gap: 9 (ref 5–15)
BUN: 11 mg/dL (ref 6–20)
CO2: 25 mmol/L (ref 22–32)
Calcium: 8.9 mg/dL (ref 8.9–10.3)
Chloride: 98 mmol/L (ref 98–111)
Creatinine, Ser: 0.69 mg/dL (ref 0.61–1.24)
GFR, Estimated: >60 mL/min (ref >60)
Glucose, Bld: 135 mg/dL — ABNORMAL HIGH (ref 70–99)
Potassium: 3.8 mmol/L (ref 3.5–5.1)
Sodium: 132 mmol/L — ABNORMAL LOW (ref 135–145)

## 2021-02-12 LAB — GLUCOSE, CAPILLARY: Glucose-Capillary: 147 mg/dL — ABNORMAL HIGH (ref 70–99)

## 2021-02-12 LAB — PROTIME-INR
INR: 2.9 — ABNORMAL HIGH (ref 0.8–1.2)
Prothrombin Time: 30.4 seconds — ABNORMAL HIGH (ref 11.4–15.2)

## 2021-02-12 LAB — LACTIC ACID, PLASMA: Lactic Acid, Venous: 2.5 mmol/L (ref 0.5–1.9)

## 2021-02-12 LAB — RESP PANEL BY RT-PCR (FLU A&B, COVID) ARPGX2
Influenza A by PCR: NEGATIVE
Influenza B by PCR: NEGATIVE
SARS Coronavirus 2 by RT PCR: NEGATIVE

## 2021-02-12 LAB — SEDIMENTATION RATE: Sed Rate: 62 mm/hr — ABNORMAL HIGH (ref 0–16)

## 2021-02-12 MED ORDER — VANCOMYCIN HCL 2000 MG/400ML IV SOLN
2000.0000 mg | Freq: Once | INTRAVENOUS | Status: AC
  Administered 2021-02-12: 2000 mg via INTRAVENOUS
  Filled 2021-02-12: qty 400

## 2021-02-12 MED ORDER — VANCOMYCIN HCL 10 G IV SOLR
2500.0000 mg | Freq: Once | INTRAVENOUS | Status: DC
  Filled 2021-02-12: qty 2500

## 2021-02-12 MED ORDER — VANCOMYCIN HCL 500 MG/100ML IV SOLN
500.0000 mg | Freq: Once | INTRAVENOUS | Status: AC
  Administered 2021-02-12: 500 mg via INTRAVENOUS
  Filled 2021-02-12: qty 100

## 2021-02-12 MED ORDER — VANCOMYCIN HCL 2000 MG/400ML IV SOLN
2000.0000 mg | Freq: Two times a day (BID) | INTRAVENOUS | Status: DC
  Administered 2021-02-13 – 2021-02-16 (×8): 2000 mg via INTRAVENOUS
  Filled 2021-02-12 (×12): qty 400

## 2021-02-12 MED ORDER — SODIUM CHLORIDE 0.9 % IV SOLN
2.0000 g | Freq: Once | INTRAVENOUS | Status: AC
  Administered 2021-02-12: 2 g via INTRAVENOUS
  Filled 2021-02-12: qty 2

## 2021-02-12 NOTE — ED Triage Notes (Signed)
Left foot sore x 4 days.

## 2021-02-12 NOTE — ED Provider Notes (Signed)
[facility] EMERGENCY DEPARTMENT Provider Note  CSN: [id number] Arrival date & time: 02/12/21 1259    History Chief Complaint  Patient presents with  . Wound Infection    HPI  Adam Long is a 45 y.o. male Pt with history of type 2  diabetes, prior aortic valve replacement and currently anticoagulated with Coumadin complains of nonhealing, foul-smelling wound to the lateral left foot.  Patient states that he began noticing some soreness to his left foot several days ago.  He was not aware that there was a wound present until yesterday.  He was evaluated previously at urgent care and advised to come to the emergency department for further evaluation.  He has a history of prior amputation of 2 toes to the same foot.  No known recent injury.  He denies significant pain, fever, chills, chest pain or shortness of breath.   Past Medical History:  Diagnosis Date  . Anxiety   . Aortic stenosis   . Cellulitis and abscess of lower extremity 06/11/2019  . Cellulitis of fourth toe of left foot   . Cholelithiasis   . Coronary artery disease    Nonobstructive CAD (40-50% LAD) 08/2019  . Depression   . Depression    Phreesia 09/27/2020  . Depression    Phreesia 11/01/2020  . Diabetes mellitus without complication (H[facility])    Phreesia 09/27/2020  . Elevated troponin level not due myocardial infarction 11/11/2019  . Essential hypertension   . Gangrene of toe of left foot ([facility]) 07/06/2019  . Heart murmur    Phreesia 09/27/2020  . Hyperlipidemia    Phreesia 09/27/2020  . Hypertension    Phreesia 09/27/2020  . Mixed hyperlipidemia   . Morbid obesity ([facility])   . S/P aortic valve replacement with mechanical valve 12/05/2019   25 mm Carbomedics top hat bileaflet mechanical valve via partial upper hemi-sternotomy  . Severe aortic stenosis 09/24/2019  . Type 2 diabetes mellitus ([facility])     Past Surgical History:  Procedure Laterality Date  . ABDOMINAL AORTOGRAM W/LOWER EXTREMITY N/A 07/06/2019    Procedure: ABDOMINAL AORTOGRAM W/LOWER EXTREMITY;  Surgeon: Sherren Kerns, MD;  Location: [facility];  Service: Cardiovascular;  Laterality: N/A;  Bilateral  . AMPUTATION Left 07/09/2019   Procedure: LEFT FOURTH and Fifth TOE AMPUTATION.;  Surgeon: Larina Earthly, MD;  Location: [facility];  Service: Vascular;  Laterality: Left;  . AORTIC VALVE REPLACEMENT N/A 12/05/2019   Procedure: PARTIAL STERNOTOMY FOR AORTIC VALVE REPLACEMENT (AVR), USING CARBOMEDICS SUPRA-ANNULAR TOP HAT ;  Surgeon: Purcell Nails, MD;  Location: [facility];  Service: Open Heart Surgery;  Laterality: N/A;  No neck lines on left  . CARDIAC VALVE REPLACEMENT N/A    Phreesia 09/27/2020  . IR RADIOLOGY PERIPHERAL GUIDED IV START  10/05/2019  . IR US GUIDE VASC ACCESS RIGHT  10/05/2019  . MULTIPLE EXTRACTIONS WITH ALVEOLOPLASTY N/A 10/26/2019   Procedure: EXTRACTION OF TOOTH #'S 3, 5-11,19-28,  AND 32 WITH ALVEOLOPLASTY;  Surgeon: Charlynne Pander, DDS;  Location: [facility];  Service: Oral Surgery;  Laterality: N/A;  . RIGHT HEART CATH AND CORONARY ANGIOGRAPHY N/A 09/24/2019   Procedure: RIGHT HEART CATH AND CORONARY ANGIOGRAPHY;  Surgeon: Tonny Bollman, MD;  Location: [facility];  Service: Cardiovascular;  Laterality: N/A;  . TEE WITHOUT CARDIOVERSION N/A 12/05/2019   Procedure: TRANSESOPHAGEAL ECHOCARDIOGRAM (TEE);  Surgeon: Purcell Nails, MD;  Location: [facility] OR;  Service: Open Heart Surgery;  Laterality: N/A;    Family History  Problem Relation Age of Onset  . Cancer Mother        Brain  . Heart disease Father   . Hyperlipidemia Father   . Hypertension Father   . Stroke Father   . Heart murmur Sister     Social History   Tobacco Use  . Smoking status: Never Smoker  . Smokeless tobacco: Former Neurosurgeon    Types: Engineer, drilling  . Vaping Use: Never used  Substance Use Topics  . Alcohol use: Not Currently    Comment: occasionally  . Drug use: No     Home Medications Prior to Admission  medications   Medication Sig Start Date End Date Taking? Authorizing Provider  atorvastatin (LIPITOR) 80 MG tablet Take 1 tablet (80 mg total) by mouth daily. 11/04/20  Yes Heather Roberts, NP  insulin glargine (LANTUS SOLOSTAR) 100 UNIT/ML Solostar Pen Inject 30 Units into the skin at bedtime. 12/17/20  Yes Dani Gobble, NP  lisinopril (ZESTRIL) 20 MG tablet TAKE 1 Tablet BY MOUTH ONCE EVERY DAY 01/07/21  Yes Heather Roberts, NP  metoprolol tartrate (LOPRESSOR) 25 MG tablet Take 1 tablet (25 mg total) by mouth 2 (two) times daily. 01/07/21  Yes Heather Roberts, NP  sertraline (ZOLOFT) 50 MG tablet Take 1 tablet by mouth once daily 01/30/21  Yes Heather Roberts, NP  sitaGLIPtin-metformin (JANUMET) 50-500 MG tablet Take 1 tablet by mouth 2 (two) times daily with a meal. 12/17/20  Yes Dani Gobble, NP  warfarin (COUMADIN) 10 MG tablet Take 1 tablet daily except 1 1/2 tablets on Mondays, Wednesdays and Fridays or as directed 10/23/20  Yes Jonelle Sidle, MD     Allergies    Pollen extract   Review of Systems   Review of Systems A comprehensive review of systems was completed and negative except as noted in HPI.    Physical Exam BP (!) 112/58 (BP Location: Left Arm)   Pulse 89   Temp 98.3 F (36.8 C) (Oral)   Resp 18   Ht 5\' 10"  (1.778 m)   Wt (!) 161 kg   SpO2 98%   BMI 50.94 kg/m   Physical Exam Vitals and nursing note reviewed.  Constitutional:      Appearance: Normal appearance.  HENT:     Head: Normocephalic and atraumatic.     Nose: Nose normal.     Mouth/Throat:     Mouth: Mucous membranes are moist.  Eyes:     Extraocular Movements: Extraocular movements intact.     Conjunctiva/sclera: Conjunctivae normal.  Cardiovascular:     Rate and Rhythm: Normal rate.     Comments: Mechanical click Pulmonary:     Effort: Pulmonary effort is normal.     Breath sounds: Normal breath sounds.  Abdominal:     General: Abdomen is flat.     Palpations: Abdomen is soft.      Tenderness: There is no abdominal tenderness.  Musculoskeletal:        General: No swelling. Normal range of motion.     Cervical back: Neck supple.  Skin:    General: Skin is warm and dry.     Comments: Ulceration with necrotic eschar at the wound bed of his prior L 5th toe amputation with foul smelling discharge and moderate surrounding erythema of foot.  Neurological:     General: No focal deficit present.     Mental Status: He is alert.  Psychiatric:  Mood and Affect: Mood normal.        ED Results / Procedures / Treatments   Labs (all labs ordered are listed, but only abnormal results are displayed) Labs Reviewed  BASIC METABOLIC PANEL - Abnormal; Notable for the following components:      Result Value   Sodium 132 (*)    Glucose, Bld 135 (*)    All other components within normal limits  CBC WITH DIFFERENTIAL/PLATELET - Abnormal; Notable for the following components:   WBC 15.7 (*)    Hemoglobin 12.6 (*)    Neutro Abs 12.7 (*)    Monocytes Absolute 1.2 (*)    Abs Immature Granulocytes 0.11 (*)    All other components within normal limits  LACTIC ACID, PLASMA - Abnormal; Notable for the following components:   Lactic Acid, Venous 2.5 (*)    All other components within normal limits  PROTIME-INR - Abnormal; Notable for the following components:   Prothrombin Time 30.4 (*)    INR 2.9 (*)    All other components within normal limits  SEDIMENTATION RATE - Abnormal; Notable for the following components:   Sed Rate 62 (*)    All other components within normal limits  CULTURE, BLOOD (ROUTINE X 2)  CULTURE, BLOOD (ROUTINE X 2)  RESP PANEL BY RT-PCR (FLU A&B, COVID) ARPGX2    EKG EKG Interpretation  Date/Time:  Thursday Feb 12 2021 14:37:06 EDT Ventricular Rate:  111 PR Interval:  186 QRS Duration: 96 QT Interval:  320 QTC Calculation: 435 R Axis:   71 Text Interpretation: Sinus tachycardia Otherwise normal ECG Since last tracing Rate faster Non-specific  ST-t changes are improved QT has shortened Confirmed by Susy Frizzle [phone]) on 02/12/2021 4:14:53 PM   Radiology DG Foot Complete Left  Result Date: 02/12/2021 CLINICAL DATA:  Foot ulcer EXAM: LEFT FOOT - COMPLETE 3+ VIEW COMPARISON:  June 11, 2019 FINDINGS: Frontal, oblique, and lateral views obtained. There is been amputation at the level of the proximal fourth and fifth proximal phalanges. There is soft tissue swelling dorsally. No acute fracture or dislocation. Remaining joint spaces appear normal. No erosive change or bony destruction. No soft tissue air or radiopaque foreign body. IMPRESSION: Soft tissue swelling. No soft tissue air or radiopaque foreign body. Status post amputations at the proximal fourth and fifth proximal phalangeal levels. Remaining bony structures appear intact. No bony destruction or erosion. No fracture or dislocation. Remaining joint spaces normal. Electronically Signed   By: Bretta Bang III M.D.   On: 02/12/2021 14:38    Procedures Procedures  Medications Ordered in the ED Medications  vancomycin (VANCOREADY) IVPB 2000 mg/400 mL (has no administration in time range)  vancomycin (VANCOREADY) IVPB 2000 mg/400 mL (2,000 mg Intravenous New Bag/Given 02/12/21 2028)    And  vancomycin (VANCOREADY) IVPB 500 mg/100 mL (500 mg Intravenous New Bag/Given 02/12/21 2105)  ceFEPIme (MAXIPIME) 2 g in sodium chloride 0.9 % 100 mL IVPB (0 g Intravenous Stopped 02/12/21 2029)     MDM Rules/Calculators/A&P MDM Patient with diabetic foot ulcer. No signs of bony involvement on xray but sed rate is elevated. Will treat aggressively given his prior amputations, Vanc/Cefepime. WBC is also elevated and mildly tachycardic on initial vitals, no fever but lactic acid is also mildly elevated raising concern for early sepsis. No hypotension to suggest shock. Patient amenable to admission. Blood cultures added. Hospitalist consulted for admission.  ED Course  I have reviewed  the triage vital signs and the nursing notes.  Pertinent  labs & imaging results that were available during my care of the patient were reviewed by me and considered in my medical decision making (see chart for details).  Clinical Course as of 02/12/21 2152  Thu Feb 12, 2021  1741 Spoke with Dr. Wendall StadeE. Emokpae who requests MRI be done to help determine which facility it would be most appropriate for patient to be admitted to.  [CS]  2024 MRI results discussed with Radiology. There is some swelling and edema but no focal abscess or signs of osteomyelitis. Will repage Hospitalist.  [CS]  2149 Spoke with Dr. Thomes DinningAdefeso, Hospitalist who will evaluate for admission.  [CS]    Clinical Course User Index [CS] Pollyann SavoySheldon, Chairty Toman B, MD    Final Clinical Impression(s) / ED Diagnoses Final diagnoses:  Diabetic ulcer of left foot associated with type 1 diabetes mellitus, limited to breakdown of skin, unspecified part of foot [facility](HCC)    Rx / DC Orders ED Discharge Orders    None       Pollyann SavoySheldon, Berdell Hostetler B, MD 02/12/21 2152

## 2021-02-12 NOTE — ED Provider Notes (Signed)
Emergency Medicine Provider Triage Evaluation Note  Adam Long , a 45 y.o. male  was evaluated in triage.  Pt with history of type 2  diabetes, prior aortic valve replacement and currently anticoagulated with Coumadin complains of nonhealing, foul-smelling wound to the lateral left foot.  Patient states that he began noticing some soreness to his left foot several days ago.  He was not aware that there was a wound present until yesterday.  He was evaluated previously at urgent care and advised to come to the emergency department for further evaluation.  He has a history of prior amputation of 2 toes to the same foot.  No known recent injury.  He denies significant pain, fever, chills, chest pain or shortness of breath.  Review of Systems  Positive: Open wound left foot Negative: Fever, chills, pain or swelling of the left foot or calf  Physical Exam  BP 135/61 (BP Location: Right Arm)   Pulse (!) 110   Temp 98.7 F (37.1 C) (Oral)   Resp 16   Ht 5\' 10"  (1.778 m)   Wt (!) 161 kg   SpO2 95%   BMI 50.94 kg/m  Gen:   Awake, no distress   Resp:  Normal effort, lungs clear to auscultation bilaterally MSK:   Moves extremities without difficulty  Other:  Bilateral peripheral edema, left greater than right.  Erythema and warmth of the left foot and left lower leg.  Foul-smelling ulceration at the lateral aspect of the left foot.  Mild serous drainage noted.  Medical Decision Making  Medically screening exam initiated at 1:58 PM.  Appropriate orders placed.  Adam Long was informed that the remainder of the evaluation will be completed by another provider, this initial triage assessment does not replace that evaluation, and the importance of remaining in the ED until their evaluation is complete.  Patient with history of diabetes has ulceration to the left foot.  Exam concerning for osteomyelitis.  He will need further evaluation in the emergency department.  He is agreeable to this  plan.   [name], PA-C 02/12/21 1403    02/14/21, MD 02/13/21 [id number]

## 2021-02-12 NOTE — Discharge Instructions (Signed)
Recommending further evaluation and management in the ED for foot wound.  Concern for necrosis given exam.  Patient aware and in agreement.  Will go by private vehicle to ED.

## 2021-02-12 NOTE — ED Notes (Addendum)
Date and time results received: 02/12/21    Test: Lactic Acid Critical Value: 2.5   Name of Provider Notified: Tammy Triplett,PA Orders Received? Or Actions Taken?: No new orders given.

## 2021-02-12 NOTE — H&P (Addendum)
History and Physical  Adam FlankKenneth R Lowery ZOX:[id number] DOB: 12-04-1975 DOA: 02/12/2021  Referring physician: Susy FrizzleSheldon, Charles, B PCP: Heather RobertsGray, Joseph M, NP  Patient coming from: Home  Chief Complaint: Left foot wound infection  HPI: Adam Long is a 45 y.o. male with medical history significant for T2DM, prior aortic valve replacement, CAD, hypertension, hyperlipidemia and depression who presents to the emergency department due to some soreness to the lateral part of left foot few days ago, initially thought it was a blister wound, but realized that it was a wound yesterday due to foul-smelling odor from the wound.  He went to an urgent care and was asked to go to the ED for further evaluation.  Patient has had prior amputation of 2 toes to the same foot.  He denies any known injury, chest pain, shortness of breath fever, chills, pain from the wound.  ED Course:  In the emergency department, he was hemodynamically stable.  Work-up in the ED showed leukocytosis and normocytic anemia, hyponatremia, hyperglycemia, lactic acidosis.  Influenza A, B and SARS coronavirus 2 was negative. Left foot x-ray showed soft tissue swelling. No soft tissue air or radiopaque foreign body. Status post amputations at the proximal fourth and fifth proximal phalangeal levels. Remaining bony structures appear intact. No bony destruction or erosion. No fracture or dislocation. Remaining joint spaces normal.  MRI of the left foot was done, official read pending He was treated with vancomycin and cefepime.  Hospitalist was asked to admit patient for further evaluation and management  Review of Systems: Constitutional: Negative for chills and fever.  HENT: Negative for ear pain and sore throat.   Eyes: Negative for pain and visual disturbance.  Respiratory: Negative for cough, chest tightness and shortness of breath.   Cardiovascular: Negative for chest pain and palpitations.  Gastrointestinal: Negative for abdominal pain  and vomiting.  Endocrine: Negative for polyphagia and polyuria.  Genitourinary: Negative for decreased urine volume, dysuria, enuresis Musculoskeletal: Positive for left foot wound.  Negative for back pain.  Skin: Negative for color change and rash.  Allergic/Immunologic: Negative for immunocompromised state.  Neurological: Negative for tremors, syncope, speech difficulty, weakness, light-headedness and headaches.  Hematological: Does not bruise/bleed easily.  All other systems reviewed and are negative   Past Medical History:  Diagnosis Date  . Anxiety   . Aortic stenosis   . Cellulitis and abscess of lower extremity 06/11/2019  . Cellulitis of fourth toe of left foot   . Cholelithiasis   . Coronary artery disease    Nonobstructive CAD (40-50% LAD) 08/2019  . Depression   . Depression    Phreesia 09/27/2020  . Depression    Phreesia 11/01/2020  . Diabetes mellitus without complication (HCC)    Phreesia 09/27/2020  . Elevated troponin level not due myocardial infarction 11/11/2019  . Essential hypertension   . Gangrene of toe of left foot (HCC) 07/06/2019  . Heart murmur    Phreesia 09/27/2020  . Hyperlipidemia    Phreesia 09/27/2020  . Hypertension    Phreesia 09/27/2020  . Mixed hyperlipidemia   . Morbid obesity (HCC)   . S/P aortic valve replacement with mechanical valve 12/05/2019   25 mm Carbomedics top hat bileaflet mechanical valve via partial upper hemi-sternotomy  . Severe aortic stenosis 09/24/2019  . Type 2 diabetes mellitus (HCC)    Past Surgical History:  Procedure Laterality Date  . ABDOMINAL AORTOGRAM W/LOWER EXTREMITY N/A 07/06/2019   Procedure: ABDOMINAL AORTOGRAM W/LOWER EXTREMITY;  Surgeon: Sherren KernsFields, Charles E, MD;  Location: [facility] INVASIVE [facility] LAB;  Service: Cardiovascular;  Laterality: N/A;  Bilateral  . AMPUTATION Left 07/09/2019   Procedure: LEFT FOURTH and Fifth TOE AMPUTATION.;  Surgeon: Larina Earthly, MD;  Location: [facility] OR;  Service: Vascular;   Laterality: Left;  . AORTIC VALVE REPLACEMENT N/A 12/05/2019   Procedure: PARTIAL STERNOTOMY FOR AORTIC VALVE REPLACEMENT (AVR), USING CARBOMEDICS SUPRA-ANNULAR TOP HAT ;  Surgeon: Purcell Nails, MD;  Location: [facility] OR;  Service: Open Heart Surgery;  Laterality: N/A;  No neck lines on left  . CARDIAC VALVE REPLACEMENT N/A    Phreesia 09/27/2020  . IR RADIOLOGY PERIPHERAL GUIDED IV START  10/05/2019  . IR US GUIDE VASC ACCESS RIGHT  10/05/2019  . MULTIPLE EXTRACTIONS WITH ALVEOLOPLASTY N/A 10/26/2019   Procedure: EXTRACTION OF TOOTH #'S 3, 5-11,19-28,  AND 32 WITH ALVEOLOPLASTY;  Surgeon: Charlynne Pander, DDS;  Location: [facility] OR;  Service: Oral Surgery;  Laterality: N/A;  . RIGHT HEART CATH AND CORONARY ANGIOGRAPHY N/A 09/24/2019   Procedure: RIGHT HEART CATH AND CORONARY ANGIOGRAPHY;  Surgeon: Tonny Bollman, MD;  Location: [facility] INVASIVE CV LAB;  Service: Cardiovascular;  Laterality: N/A;  . TEE WITHOUT CARDIOVERSION N/A 12/05/2019   Procedure: TRANSESOPHAGEAL ECHOCARDIOGRAM (TEE);  Surgeon: Purcell Nails, MD;  Location: [facility] OR;  Service: Open Heart Surgery;  Laterality: N/A;    Social History:  reports that he has never smoked. He has quit using smokeless tobacco.  His smokeless tobacco use included chew. He reports previous alcohol use. He reports that he does not use drugs.   Allergies  Allergen Reactions  . Pollen Extract Other (See Comments)    Runny nose, watery eyes, sneezing    Family History  Problem Relation Age of Onset  . Cancer Mother        Brain  . Heart disease Father   . Hyperlipidemia Father   . Hypertension Father   . Stroke Father   . Heart murmur Sister      Prior to Admission medications   Medication Sig Start Date End Date Taking? Authorizing Provider  atorvastatin (LIPITOR) 80 MG tablet Take 1 tablet (80 mg total) by mouth daily. 11/04/20  Yes Heather Roberts, NP  insulin glargine (LANTUS SOLOSTAR) 100 UNIT/ML Solostar Pen Inject 30 Units into the skin at  bedtime. 12/17/20  Yes Dani Gobble, NP  lisinopril (ZESTRIL) 20 MG tablet TAKE 1 Tablet BY MOUTH ONCE EVERY DAY 01/07/21  Yes Heather Roberts, NP  metoprolol tartrate (LOPRESSOR) 25 MG tablet Take 1 tablet (25 mg total) by mouth 2 (two) times daily. 01/07/21  Yes Heather Roberts, NP  sertraline (ZOLOFT) 50 MG tablet Take 1 tablet by mouth once daily 01/30/21  Yes Heather Roberts, NP  sitaGLIPtin-metformin (JANUMET) 50-500 MG tablet Take 1 tablet by mouth 2 (two) times daily with a meal. 12/17/20  Yes Dani Gobble, NP  warfarin (COUMADIN) 10 MG tablet Take 1 tablet daily except 1 1/2 tablets on Mondays, Wednesdays and Fridays or as directed 10/23/20  Yes Jonelle Sidle, MD    Physical Exam: BP 130/77 (BP Location: Left Arm)   Pulse 77   Temp 98.3 F (36.8 C) (Oral)   Resp 18   Ht 5\' 10"  (1.778 m)   Wt (!) 161 kg   SpO2 100%   BMI 50.94 kg/m   . General: 45 y.o. year-old male well developed well nourished in no acute distress.  Alert and oriented x3. 59 HEENT: Dry mucous membranes.  NCAT, EOMI . Neck: Supple, trachea medial . Cardiovascular: Mechanical click on auscultation.  Regular rate and rhythm with no rubs or gallops.  No thyromegaly or JVD noted.   Marland Kitchen Respiratory: Clear to auscultation with no wheezes or rales. Good inspiratory effort. . Abdomen: Soft, nontender nondistended with normal bowel sounds x4 quadrants. . Muskuloskeletal: No cyanosis, clubbing or edema noted bilaterally . Neuro: CN II-XII intact, strength 5/5 x 4, sensation, reflexes intact . Skin: Ulcerative lesions with necrotic eschar with surrounding erythema, warm to touch was noted. Marland Kitchen Psychiatry: Judgement and insight appear normal. Mood is appropriate for condition and setting          Labs on Admission:  Basic Metabolic Panel: Recent Labs  Lab 02/12/21 1455  NA 132*  K 3.8  CL 98  CO2 25  GLUCOSE 135*  BUN 11  CREATININE 0.69  CALCIUM 8.9   Liver Function Tests: No results for input(s): AST,  ALT, ALKPHOS, BILITOT, PROT, ALBUMIN in the last 168 hours. No results for input(s): LIPASE, AMYLASE in the last 168 hours. No results for input(s): AMMONIA in the last 168 hours. CBC: Recent Labs  Lab 02/12/21 1455  WBC 15.7*  NEUTROABS 12.7*  HGB 12.6*  HCT 39.0  MCV 89.9  PLT 262   Cardiac Enzymes: No results for input(s): CKTOTAL, CKMB, CKMBINDEX, TROPONINI in the last 168 hours.  BNP (last 3 results) No results for input(s): BNP in the last 8760 hours.  ProBNP (last 3 results) No results for input(s): PROBNP in the last 8760 hours.  CBG: Recent Labs  Lab 02/12/21 2329  GLUCAP 147*    Radiological Exams on Admission: DG Foot Complete Left  Result Date: 02/12/2021 CLINICAL DATA:  Foot ulcer EXAM: LEFT FOOT - COMPLETE 3+ VIEW COMPARISON:  June 11, 2019 FINDINGS: Frontal, oblique, and lateral views obtained. There is been amputation at the level of the proximal fourth and fifth proximal phalanges. There is soft tissue swelling dorsally. No acute fracture or dislocation. Remaining joint spaces appear normal. No erosive change or bony destruction. No soft tissue air or radiopaque foreign body. IMPRESSION: Soft tissue swelling. No soft tissue air or radiopaque foreign body. Status post amputations at the proximal fourth and fifth proximal phalangeal levels. Remaining bony structures appear intact. No bony destruction or erosion. No fracture or dislocation. Remaining joint spaces normal. Electronically Signed   By: Bretta Bang III M.D.   On: 02/12/2021 14:38    EKG: I independently viewed the EKG done and my findings are as followed: Sinus tachycardia at a rate of 111 bpm  Assessment/Plan Present on Admission: . Open wound of left foot . HTN (hypertension) . Morbid obesity (HCC) . Hyperlipidemia  Principal Problem:   Open wound of left foot Active Problems:   HTN (hypertension)   Hyperlipidemia   Morbid obesity (HCC)   S/P aortic valve replacement with  mechanical valve   Cellulitis of left foot   Leukocytosis   Hyponatremia   Normocytic anemia   Lactic acidosis   Hyperglycemia due to diabetes mellitus (HCC)  Open wound of left foot with superimposed cellulitis r/o osteomyelitis Left foot x-ray showed soft tissue swelling MRI of left foot to rule out osteomyelitis was ordered, but official reading still pending He was started on vancomycin and cefepime, we will continue with vancomycin and Zosyn at this time with plan to de-escalate based on MRI rule out of osteomyelitis Continue wound care Continue PT/OT eval and treat Orthopedic surgery will be consulted for evaluation of  possible I&D in the morning Patient will be placed n.p.o. in anticipation for possible surgical intervention in the morning  Leukocytosis possibly secondary to above WBC 15.7, continue treatment as described above  Lactic acidosis Lactic acid 2.5, this may be due to dehydration, continue to trend lactic acid  Hyponatremia/Dehydration  Na  132, continue IV hydration  Normocytic anemia H/H12.6/39; baseline hemoglobin ranged within 10.8- 13.9  Essential hypertension Continue lisinopril and Lopressor when patient resumes oral intake  Hyperlipidemia Continue Lipitor when patient resumes oral intake  Hyperglycemia due to T2DM Continue SSI and hypoglycemia protocol  Status post mechanical aortic valve replacement Continue warfarin per home regimen INR 2.9, continue to monitor INR with goal range between 2.5-3.5  Morbid obesity (BMI of 50.94) Patient will be counseled on lifestyle and diet modification Patient will need to follow-up with outpatient PCP and dietitian for weight loss program  DVT prophylaxis: SCDs, warfarin  Code Status: Full code  Family Communication: None at bedside  Disposition Plan:  Patient is from:                        home Anticipated DC to:                   SNF or family members home Anticipated DC date:               2-3  days Anticipated DC barriers:         Patient will need inpatient management for left foot wound, cellulitis and possible surgical intervention   Consults called: Orthopedic surgery  Admission status: Inpatient    Frankey Shown MD [facility]  02/13/2021, 4:18 AM

## 2021-02-12 NOTE — ED Notes (Signed)
Pt returned from MRI °

## 2021-02-12 NOTE — ED Notes (Signed)
Pt provided dinner tray with grilled/baked chicken. Became agitated and profane with RN, apologized. covid swab obtained. Admitting doctor at bedside.

## 2021-02-12 NOTE — Progress Notes (Signed)
Pharmacy Antibiotic Note  MARKEVIUS TROMBETTA is a 45 y.o. male admitted on 02/12/2021 with wound infection of L foot. Pharmacy has been consulted for vancomycin dosing. Lactic up, Cr <1 at baseline  Plan: Vancomycin 2500mg  x1 then 2000mg  IV q12h - est AUC 471 Follow Cr, C, LOT Vancomycin levels as needed  Height: 5\' 10"  (177.8 cm) Weight: (!) 161 kg (355 lb) IBW/kg (Calculated) : 73  Temp (24hrs), Avg:98.7 F (37.1 C), Min:98.6 F (37 C), Max:98.7 F (37.1 C)  Recent Labs  Lab 02/12/21 1455  WBC 15.7*  CREATININE 0.69  LATICACIDVEN 2.5*    Estimated Creatinine Clearance: 180.3 mL/min (by C-G formula based on SCr of 0.69 mg/dL).    Allergies  Allergen Reactions  . Pollen Extract Other (See Comments)    Runny nose, watery eyes, sneezing    , PharmD, BCPS, [facility] Clinical Pharmacist Please check AMION for all [facility] Pharmacy numbers 02/12/2021

## 2021-02-12 NOTE — ED Provider Notes (Signed)
[facility]   [id number] 02/12/21 Arrival Time: 0900  Abbreviated note CC:LT foot wound  SUBJECTIVE: History from: patient. Adam Long is a 45 y.o. male complains of LT foot wound x 4 days.  Denies a precipitating event or specific injury.  Localizes the pain to the bottom of left foot.  Describes the pain as intermittent.  Worse with walking.  Has tried cleaning without relief.  Reports partial foot amputation.  Has DM, report loss of sensation in foot.  Denies fever, chills, nausea, vomiting.  Complains of redness, swelling, drainage.    ROS: As per HPI.  All other pertinent ROS negative.     Past Medical History:  Diagnosis Date  . Anxiety   . Aortic stenosis   . Cellulitis and abscess of lower extremity 06/11/2019  . Cellulitis of fourth toe of left foot   . Cholelithiasis   . Coronary artery disease    Nonobstructive CAD (40-50% LAD) 08/2019  . Depression   . Depression    Phreesia 09/27/2020  . Depression    Phreesia 11/01/2020  . Diabetes mellitus without complication (HCC)    Phreesia 09/27/2020  . Elevated troponin level not due myocardial infarction 11/11/2019  . Essential hypertension   . Gangrene of toe of left foot (HCC) 07/06/2019  . Heart murmur    Phreesia 09/27/2020  . Hyperlipidemia    Phreesia 09/27/2020  . Hypertension    Phreesia 09/27/2020  . Mixed hyperlipidemia   . Morbid obesity (HCC)   . S/P aortic valve replacement with mechanical valve 12/05/2019   25 mm Carbomedics top hat bileaflet mechanical valve via partial upper hemi-sternotomy  . Severe aortic stenosis 09/24/2019  . Type 2 diabetes mellitus (HCC)    Past Surgical History:  Procedure Laterality Date  . ABDOMINAL AORTOGRAM W/LOWER EXTREMITY N/A 07/06/2019   Procedure: ABDOMINAL AORTOGRAM W/LOWER EXTREMITY;  Surgeon: Sherren Kerns, MD;  Location: [facility] INVASIVE CV LAB;  Service: Cardiovascular;  Laterality: N/A;  Bilateral  . AMPUTATION Left 07/09/2019   Procedure: LEFT  FOURTH and Fifth TOE AMPUTATION.;  Surgeon: Larina Earthly, MD;  Location: [facility] OR;  Service: Vascular;  Laterality: Left;  . AORTIC VALVE REPLACEMENT N/A 12/05/2019   Procedure: PARTIAL STERNOTOMY FOR AORTIC VALVE REPLACEMENT (AVR), USING CARBOMEDICS SUPRA-ANNULAR TOP HAT ;  Surgeon: Purcell Nails, MD;  Location: [facility] OR;  Service: Open Heart Surgery;  Laterality: N/A;  No neck lines on left  . CARDIAC VALVE REPLACEMENT N/A    Phreesia 09/27/2020  . IR RADIOLOGY PERIPHERAL GUIDED IV START  10/05/2019  . IR US GUIDE VASC ACCESS RIGHT  10/05/2019  . MULTIPLE EXTRACTIONS WITH ALVEOLOPLASTY N/A 10/26/2019   Procedure: EXTRACTION OF TOOTH #'S 3, 5-11,19-28,  AND 32 WITH ALVEOLOPLASTY;  Surgeon: Charlynne Pander, DDS;  Location: [facility] OR;  Service: Oral Surgery;  Laterality: N/A;  . RIGHT HEART CATH AND CORONARY ANGIOGRAPHY N/A 09/24/2019   Procedure: RIGHT HEART CATH AND CORONARY ANGIOGRAPHY;  Surgeon: Tonny Bollman, MD;  Location: [facility] INVASIVE CV LAB;  Service: Cardiovascular;  Laterality: N/A;  . TEE WITHOUT CARDIOVERSION N/A 12/05/2019   Procedure: TRANSESOPHAGEAL ECHOCARDIOGRAM (TEE);  Surgeon: Purcell Nails, MD;  [facility] OR;  Service: Open Heart Surgery;  Laterality: N/A;   Allergies  Allergen Reactions  . Pollen Extract Other (See Comments)    Runny nose, watery eyes, sneezing   No current facility-administered medications on file prior to encounter.   Current Outpatient Medications on File Prior to Encounter  Medication Sig  Dispense Refill  . atorvastatin (LIPITOR) 80 MG tablet Take 1 tablet (80 mg total) by mouth daily. 90 tablet 3  . insulin glargine (LANTUS SOLOSTAR) 100 UNIT/ML Solostar Pen Inject 30 Units into the skin at bedtime. 30 mL 3  . lisinopril (ZESTRIL) 20 MG tablet TAKE 1 Tablet BY MOUTH ONCE EVERY DAY 90 tablet 2  . metoprolol tartrate (LOPRESSOR) 25 MG tablet Take 1 tablet (25 mg total) by mouth 2 (two) times daily. 180 tablet 1  . sertraline (ZOLOFT) 50 MG tablet  Take 1 tablet by mouth once daily 30 tablet 0  . sitaGLIPtin-metformin (JANUMET) 50-500 MG tablet Take 1 tablet by mouth 2 (two) times daily with a meal. 180 tablet 3  . warfarin (COUMADIN) 10 MG tablet Take 1 tablet daily except 1 1/2 tablets on Mondays, Wednesdays and Fridays or as directed 45 tablet 6   Social History   Socioeconomic History  . Marital status: Single    Spouse name: Not on file  . Number of children: Not on file  . Years of education: Not on file  . Highest education level: Not on file  Occupational History    Comment: Glass blower/designer- self-employed  Tobacco Use  . Smoking status: Never Smoker  . Smokeless tobacco: Former Neurosurgeon    Types: Engineer, drilling  . Vaping Use: Never used  Substance and Sexual Activity  . Alcohol use: Not Currently    Comment: occasionally  . Drug use: No  . Sexual activity: Yes    Birth control/protection: None  Other Topics Concern  . Not on file  Social History Narrative  . Not on file   Social Determinants of Health   Financial Resource Strain: Not on file  Food Insecurity: Not on file  Transportation Needs: Not on file  Physical Activity: Not on file  Stress: Not on file  Social Connections: Not on file  Intimate Partner Violence: Not on file   Family History  Problem Relation Age of Onset  . Cancer Mother        Brain  . Heart disease Father   . Hyperlipidemia Father   . Hypertension Father   . Stroke Father   . Heart murmur Sister     OBJECTIVE:  Vitals:   02/12/21 0908  BP: 128/77  Pulse: 92  Resp: 18  Temp: 98.6 F (37 C)  TempSrc: Oral  SpO2: 98%    General appearance: ALERT; in no acute distress.  Head: NCAT Lungs: Normal respiratory effort CV: Difficult to assess Skin: warm and dry; LT foot: circular wound to distal foot, plantar aspect, apx 2-3 cm in diameter with necrosis, foul odor present, probable drainage; erythema diffuse about the distal foot, TTP Psychological: alert and  cooperative; normal mood and affect   ASSESSMENT & PLAN:  1. Open wound of left foot with complication, initial encounter     Recommending further evaluation and management in the ED for foot wound.  Concern for necrosis given exam.  Patient aware and in agreement.  Will go by private vehicle to ED.      Rennis Harding, PA-C 02/12/21 [phone]

## 2021-02-12 NOTE — ED Triage Notes (Signed)
Pt with wound to left foot.  Sent by by Urgent Care due to concern of necrosis.

## 2021-02-12 NOTE — ED Notes (Signed)
Patient to MRI at this time. ABT paused for procedure.

## 2021-02-13 DIAGNOSIS — E872 Acidosis, unspecified: Secondary | ICD-10-CM

## 2021-02-13 DIAGNOSIS — D72829 Elevated white blood cell count, unspecified: Secondary | ICD-10-CM

## 2021-02-13 DIAGNOSIS — E871 Hypo-osmolality and hyponatremia: Secondary | ICD-10-CM

## 2021-02-13 DIAGNOSIS — E1165 Type 2 diabetes mellitus with hyperglycemia: Secondary | ICD-10-CM

## 2021-02-13 DIAGNOSIS — L03116 Cellulitis of left lower limb: Secondary | ICD-10-CM

## 2021-02-13 DIAGNOSIS — E860 Dehydration: Secondary | ICD-10-CM

## 2021-02-13 DIAGNOSIS — D649 Anemia, unspecified: Secondary | ICD-10-CM

## 2021-02-13 LAB — PROTIME-INR
INR: 2.8 — ABNORMAL HIGH (ref 0.8–1.2)
Prothrombin Time: 29.8 seconds — ABNORMAL HIGH (ref 11.4–15.2)

## 2021-02-13 LAB — MAGNESIUM: Magnesium: 1.8 mg/dL (ref 1.7–2.4)

## 2021-02-13 LAB — CBC
HCT: 38.8 % — ABNORMAL LOW (ref 39.0–52.0)
Hemoglobin: 12.5 g/dL — ABNORMAL LOW (ref 13.0–17.0)
MCH: 29.1 pg (ref 26.0–34.0)
MCHC: 32.2 g/dL (ref 30.0–36.0)
MCV: 90.2 fL (ref 80.0–100.0)
Platelets: 247 10*3/uL (ref 150–400)
RBC: 4.3 MIL/uL (ref 4.22–5.81)
RDW: 14.5 % (ref 11.5–15.5)
WBC: 11.4 10*3/uL — ABNORMAL HIGH (ref 4.0–10.5)
nRBC: 0 % (ref 0.0–0.2)

## 2021-02-13 LAB — COMPREHENSIVE METABOLIC PANEL
ALT: 20 U/L (ref 0–44)
AST: 19 U/L (ref 15–41)
Albumin: 3 g/dL — ABNORMAL LOW (ref 3.5–5.0)
Alkaline Phosphatase: 53 U/L (ref 38–126)
Anion gap: 7 (ref 5–15)
BUN: 13 mg/dL (ref 6–20)
CO2: 27 mmol/L (ref 22–32)
Calcium: 8.7 mg/dL — ABNORMAL LOW (ref 8.9–10.3)
Chloride: 99 mmol/L (ref 98–111)
Creatinine, Ser: 0.75 mg/dL (ref 0.61–1.24)
GFR, Estimated: >60 mL/min (ref >60)
Glucose, Bld: 118 mg/dL — ABNORMAL HIGH (ref 70–99)
Potassium: 4.1 mmol/L (ref 3.5–5.1)
Sodium: 133 mmol/L — ABNORMAL LOW (ref 135–145)
Total Bilirubin: 1.4 mg/dL — ABNORMAL HIGH (ref 0.3–1.2)
Total Protein: 7 g/dL (ref 6.5–8.1)

## 2021-02-13 LAB — LACTIC ACID, PLASMA
Lactic Acid, Venous: 1.2 mmol/L (ref 0.5–1.9)
Lactic Acid, Venous: 0.9 mmol/L (ref 0.5–1.9)

## 2021-02-13 LAB — GLUCOSE, CAPILLARY
Glucose-Capillary: 134 mg/dL — ABNORMAL HIGH (ref 70–99)
Glucose-Capillary: 173 mg/dL — ABNORMAL HIGH (ref 70–99)
Glucose-Capillary: 110 mg/dL — ABNORMAL HIGH (ref 70–99)

## 2021-02-13 LAB — HIV ANTIBODY (ROUTINE TESTING W REFLEX): HIV Screen 4th Generation wRfx: NONREACTIVE

## 2021-02-13 LAB — PHOSPHORUS: Phosphorus: 3.8 mg/dL (ref 2.5–4.6)

## 2021-02-13 LAB — APTT: aPTT: 86 seconds — ABNORMAL HIGH (ref 24–36)

## 2021-02-13 MED ORDER — SERTRALINE HCL 50 MG PO TABS
50.0000 mg | ORAL_TABLET | Freq: Every day | ORAL | Status: DC
  Administered 2021-02-13 – 2021-02-17 (×5): 50 mg via ORAL
  Filled 2021-02-13 (×5): qty 1

## 2021-02-13 MED ORDER — LISINOPRIL 20 MG PO TABS
20.0000 mg | ORAL_TABLET | Freq: Every day | ORAL | Status: DC
  Administered 2021-02-13 – 2021-02-17 (×5): 20 mg via ORAL
  Filled 2021-02-13: qty 2
  Filled 2021-02-13: qty 1
  Filled 2021-02-13: qty 2
  Filled 2021-02-13: qty 1
  Filled 2021-02-13: qty 2

## 2021-02-13 MED ORDER — WARFARIN SODIUM 5 MG PO TABS: 10.0000 mg | ORAL_TABLET | Freq: Every day | ORAL | Status: DC

## 2021-02-13 MED ORDER — SODIUM CHLORIDE 0.9 % IV SOLN: Freq: Once | INTRAVENOUS | Status: AC

## 2021-02-13 MED ORDER — WARFARIN - PHARMACIST DOSING INPATIENT: Freq: Every day | Status: DC

## 2021-02-13 MED ORDER — METOPROLOL TARTRATE 25 MG PO TABS
25.0000 mg | ORAL_TABLET | Freq: Two times a day (BID) | ORAL | Status: DC
  Administered 2021-02-13 – 2021-02-17 (×9): 25 mg via ORAL
  Filled 2021-02-13 (×9): qty 1

## 2021-02-13 MED ORDER — PIPERACILLIN-TAZOBACTAM 3.375 G IVPB 30 MIN: 3.3750 g | Freq: Four times a day (QID) | INTRAVENOUS | Status: DC

## 2021-02-13 MED ORDER — WARFARIN SODIUM 5 MG PO TABS: 10.0000 mg | ORAL_TABLET | Freq: Once | ORAL | Status: DC

## 2021-02-13 MED ORDER — ATORVASTATIN CALCIUM 80 MG PO TABS
80.0000 mg | ORAL_TABLET | Freq: Every day | ORAL | Status: DC
  Administered 2021-02-13 – 2021-02-16 (×4): 80 mg via ORAL
  Filled 2021-02-13 (×2): qty 2
  Filled 2021-02-13 (×2): qty 1
  Filled 2021-02-13: qty 2

## 2021-02-13 MED ORDER — PIPERACILLIN-TAZOBACTAM 3.375 G IVPB
3.3750 g | Freq: Three times a day (TID) | INTRAVENOUS | Status: DC
  Administered 2021-02-13 – 2021-02-17 (×12): 3.375 g via INTRAVENOUS
  Filled 2021-02-13 (×14): qty 50

## 2021-02-13 NOTE — Progress Notes (Signed)
PROGRESS NOTE    Adam Long  RUE:[id number] DOB: June 08, 1976 DOA: 02/12/2021 PCP: Heather RobertsGray, Joseph M, NP   Brief Narrative:  Adam Long is a 45 y.o. male with medical history significant for T2DM, prior aortic valve replacement, CAD, hypertension, hyperlipidemia and depression who presents to the emergency department due to some soreness to the lateral part of left foot few days ago, initially thought it was a blister wound, but realized that it was a wound yesterday due to foul-smelling odor from the wound.  Left foot x-ray showed soft tissue swelling. No soft tissue air or radiopaque foreign body on x-ray.  Placed on vancomycin and cefepime.  Hospitalist was asked to admit patient for further evaluation and management   Assessment & Plan:   Principal Problem:   Open wound of left foot Active Problems:   HTN (hypertension)   Hyperlipidemia   Morbid obesity (HCC)   S/P aortic valve replacement with mechanical valve   Cellulitis of left foot   Leukocytosis   Hyponatremia   Normocytic anemia   Lactic acidosis   Hyperglycemia due to diabetes mellitus (HCC)   Dehydration    Open wound of left foot with superimposed cellulitis MRI unremarkable for osteomyelitis  Pt does NOT meet sepsis criteria Continue vancomycin, Zosyn  -de-escalate on cultures Continue wound care Continue PT/OT eval and treat Orthopedic surgery will be consulted for evaluation of possible I&D in the morning Resume diabetic diet  Lactic acidosis secondary to above Lactic acid 2.5, this may be due to dehydration, continue to trend lactic acid  Hypovolemic hyponatremia/Dehydration  Na  132, continue IV hydration  Normocytic anemia Near baseline hemoglobin: ranged within 10-14  Essential hypertension Continue lisinopril and Lopressor when patient resumes oral intake  Hyperlipidemia Continue Lipitor when patient resumes oral intake  Hyperglycemia due to T2DM Continue SSI and hypoglycemia  protocol  Status post mechanical aortic valve replacement Continue warfarin per home regimen -and per pharmacy consult Continue to monitor INR with goal range between 2.5-3.5  Morbid obesity (BMI of 50.94) Patient will be counseled on lifestyle and diet modification Patient will need to follow-up with outpatient PCP and dietitian for weight loss program  DVT prophylaxis: SCDs, warfarin per pharmacy consult Code Status: Full code Family Communication: None present  Status is: Inpatient  Dispo: The patient is from: Home              Anticipated d/c is to: Home              Anticipated d/c date is: 40 to 72 hours              Patient currently not medically stable for discharge  Consultants:   None  Procedures:   None  Antimicrobials:  Vancomycin, Zosyn  Subjective: No acute issues or events overnight denies nausea vomiting diarrhea constipation headache fevers or chills  Objective: Vitals:   02/12/21 2226 02/12/21 2319 02/12/21 2322 02/13/21 0407  BP: 140/86 114/62  130/77  Pulse: 94 88  77  Resp: 18 19  18   Temp: 98.9 F (37.2 C) 98.3 F (36.8 C)  98.3 F (36.8 C)  TempSrc: Oral Oral  Oral  SpO2: 100% 100% 100% 100%  Weight:      Height:        Intake/Output Summary (Last 24 hours) at 02/13/2021 0716 Last data filed at 02/13/2021 0600 Gross per 24 hour  Intake 177.9 ml  Output 375 ml  Net -197.1 ml   [employer]iled Weights  02/12/21 1309  Weight: (!) 161 kg    Examination:  General:  Pleasantly resting in bed, No acute distress. HEENT:  Normocephalic atraumatic.  Sclerae nonicteric, noninjected.  Extraocular movements intact bilaterally. Neck:  Without mass or deformity.  Trachea is midline. Lungs:  Clear to auscultate bilaterally without rhonchi, wheeze, or rales. Heart:  Regular rate and rhythm.  Without murmurs, rubs, or gallops. Abdomen:  Soft, nontender, nondistended.  Without guarding or rebound. Extremities: Without cyanosis, clubbing, edema, left  foot bandage clean dry intact Vascular:  Dorsalis pedis and posterior tibial pulses palpable bilaterally. Skin:  Warm and dry, no erythema, no ulcerations.   Data Reviewed: I have personally reviewed following labs and imaging studies  CBC: Recent Labs  Lab 02/12/21 1455 02/13/21 0443  WBC 15.7* 11.4*  NEUTROABS 12.7*  --   HGB 12.6* 12.5*  HCT 39.0 38.8*  MCV 89.9 90.2  PLT 262 247   Basic Metabolic Panel: Recent Labs  Lab 02/12/21 1455 02/13/21 0443  NA 132* 133*  K 3.8 4.1  CL 98 99  CO2 25 27  GLUCOSE 135* 118*  BUN 11 13  CREATININE 0.69 0.75  CALCIUM 8.9 8.7*  MG  --  1.8  PHOS  --  3.8   GFR: Estimated Creatinine Clearance: 180.3 mL/min (by C-G formula based on SCr of 0.75 mg/dL). Liver Function Tests: Recent Labs  Lab 02/13/21 0443  AST 19  ALT 20  ALKPHOS 53  BILITOT 1.4*  PROT 7.0  ALBUMIN 3.0*   No results for input(s): LIPASE, AMYLASE in the last 168 hours. No results for input(s): AMMONIA in the last 168 hours. Coagulation Profile: Recent Labs  Lab 02/12/21 1455 02/13/21 0443  INR 2.9* 2.8*   Cardiac Enzymes: No results for input(s): CKTOTAL, CKMB, CKMBINDEX, TROPONINI in the last 168 hours. BNP (last 3 results) No results for input(s): PROBNP in the last 8760 hours. HbA1C: No results for input(s): HGBA1C in the last 72 hours. CBG: Recent Labs  Lab 02/12/21 2329  GLUCAP 147*   Lipid Profile: No results for input(s): CHOL, HDL, LDLCALC, TRIG, CHOLHDL, LDLDIRECT in the last 72 hours. Thyroid Function Tests: No results for input(s): TSH, T4TOTAL, FREET4, T3FREE, THYROIDAB in the last 72 hours. Anemia Panel: No results for input(s): VITAMINB12, FOLATE, FERRITIN, TIBC, IRON, RETICCTPCT in the last 72 hours. Sepsis Labs: Recent Labs  Lab 02/12/21 1455 02/13/21 0443  LATICACIDVEN 2.5* 1.2    Recent Results (from the past 240 hour(s))  Culture, blood (routine x 2)     Status: None (Preliminary result)   Collection Time:  02/12/21  6:09 PM   Specimen: BLOOD  Result Value Ref Range Status   Specimen Description BLOOD RIGHT ANTECUBITAL  Final   Special Requests   Final    Blood Culture adequate volume BOTTLES DRAWN AEROBIC AND ANAEROBIC Performed at [facility], [address] [place], [place] [address]    Culture PENDING  Incomplete   Report Status PENDING  Incomplete  Culture, blood (routine x 2)     Status: None (Preliminary result)   Collection Time: 02/12/21  6:09 PM   Specimen: BLOOD LEFT ARM  Result Value Ref Range Status   Specimen Description BLOOD LEFT ARM  Final   Special Requests   Final    Blood Culture adequate volume BOTTLES DRAWN AEROBIC AND ANAEROBIC Performed at [facility], [address] [place], [place] [address]    Culture PENDING  Incomplete   Report Status PENDING  Incomplete  Resp Panel  by RT-PCR (Flu A&B, Covid) Nasopharyngeal Swab     Status: None   Collection Time: 02/12/21  9:00 PM   Specimen: Nasopharyngeal Swab; Nasopharyngeal(NP) swabs in vial transport medium  Result Value Ref Range Status   SARS Coronavirus 2 by RT PCR NEGATIVE NEGATIVE Final    Comment: (NOTE) SARS-CoV-2 target nucleic acids are NOT DETECTED.  The SARS-CoV-2 RNA is generally detectable in upper respiratory specimens during the acute phase of infection. The lowest concentration of SARS-CoV-2 viral copies this assay can detect is 138 copies/mL. A negative result does not preclude SARS-Cov-2 infection and should not be used as the sole basis for treatment or other patient management decisions. A negative result may occur with  improper specimen collection/handling, submission of specimen other than nasopharyngeal swab, presence of viral mutation(s) within the areas targeted by this assay, and inadequate number of viral copies(<138 copies/mL). A negative result must be combined with clinical observations, patient history, and epidemiological information. The expected result is Negative.  Fact  Sheet for Patients:  BloggerCourse.com  Fact Sheet for Healthcare Providers:  SeriousBroker.it  This test is no t yet approved or cleared by the [place] FDA and  has been authorized for detection and/or diagnosis of SARS-CoV-2 by FDA under an Emergency Use Authorization (EUA). This EUA will remain  in effect (meaning this test can be used) for the duration of the COVID-19 declaration under Section 564(b)(1) of the Act, 21 U.S.C.section 360bbb-3(b)(1), unless the authorization is terminated  or revoked sooner.       Influenza A by PCR NEGATIVE NEGATIVE Final   Influenza B by PCR NEGATIVE NEGATIVE Final    Comment: (NOTE) The Xpert Xpress SARS-CoV-2/FLU/RSV plus assay is intended as an aid in the diagnosis of influenza from Nasopharyngeal swab specimens and should not be used as a sole basis for treatment. Nasal washings and aspirates are unacceptable for Xpert Xpress SARS-CoV-2/FLU/RSV testing.  Fact Sheet for Patients: BloggerCourse.com  Fact Sheet for Healthcare Providers: SeriousBroker.it  This test is not yet approved or cleared by the [place] FDA and has been authorized for detection and/or diagnosis of SARS-CoV-2 by FDA under an Emergency Use Authorization (EUA). This EUA will remain in effect (meaning this test can be used) for the duration of the COVID-19 declaration under Section 564(b)(1) of the Act, 21 U.S.C. section 360bbb-3(b)(1), unless the authorization is terminated or revoked.  Performed at [facility], [address] [place], [place] [address]          Radiology Studies: DG Foot Complete Left  Result Date: 02/12/2021 CLINICAL DATA:  Foot ulcer EXAM: LEFT FOOT - COMPLETE 3+ VIEW COMPARISON:  June 11, 2019 FINDINGS: Frontal, oblique, and lateral views obtained. There is been amputation at the level of the proximal fourth and fifth  proximal phalanges. There is soft tissue swelling dorsally. No acute fracture or dislocation. Remaining joint spaces appear normal. No erosive change or bony destruction. No soft tissue air or radiopaque foreign body. IMPRESSION: Soft tissue swelling. No soft tissue air or radiopaque foreign body. Status post amputations at the proximal fourth and fifth proximal phalangeal levels. Remaining bony structures appear intact. No bony destruction or erosion. No fracture or dislocation. Remaining joint spaces normal. Electronically Signed   By: Bretta Bang III M.D.   On: 02/12/2021 14:38    Scheduled Meds: Continuous Infusions: . piperacillin-tazobactam (ZOSYN)  IV 3.375 g (02/13/21 0533)  . vancomycin       LOS: 1 day    Time spent:  Azucena Fallen, DO [facility]  If 7PM-7AM, please contact night-coverage [email or web address]  02/13/2021, 7:16 AM

## 2021-02-13 NOTE — Evaluation (Signed)
Physical Therapy Evaluation Patient Details Name: Adam Long MRN: [id number] DOB: [birth year] Today's Date: 02/13/2021   History of Present Illness  Adam Long is a 45 y.o. male with medical history significant for T2DM, prior aortic valve replacement, CAD, hypertension, hyperlipidemia and depression who presents to the emergency department due to some soreness to the lateral part of left foot few days ago, initially thought it was a blister wound, but realized that it was a wound yesterday due to foul-smelling odor from the wound.  He went to an urgent care and was asked to go to the ED for further evaluation.  Patient has had prior amputation of 2 toes to the same foot.  He denies any known injury, chest pain, shortness of breath fever, chills, pain from the wound.    Clinical Impression  Patient functioning near baseline for functional mobility and gait with good return for keeping bodyweight off left forefoot during ambulation without loss of balance.  Plan:  Patient discharged from physical therapy with recommendations below and to care of nursing for ambulation daily as tolerated for length of stay.     Follow Up Recommendations No PT follow up;Supervision - Intermittent    Equipment Recommendations  Other (comment) (post op shoe for left foot)    Recommendations for Other Services       Precautions / Restrictions Precautions Precautions: None Restrictions Weight Bearing Restrictions: No      Mobility  Bed Mobility Overal bed mobility: Modified Independent                  Transfers Overall transfer level: Modified independent               General transfer comment: slightly increased time  Ambulation/Gait Ambulation/Gait assistance: Modified independent (Device/Increase time) Gait Distance (Feet): 120 Feet Assistive device: None;IV Pole Gait Pattern/deviations: Decreased step length - left;Decreased stance time - right;Antalgic Gait velocity:  decreased   General Gait Details: grossly WFL with good return for keeping body weight off left forefoot without loss of balance and no use of AD and able to push IV pole without problem during ambulation  Stairs            Wheelchair Mobility    Modified Rankin (Stroke Patients Only)       Balance Overall balance assessment: No apparent balance deficits (not formally assessed)                                           Pertinent Vitals/Pain Pain Assessment: 0-10 Pain Score: 1  Pain Location: left foot Pain Descriptors / Indicators: Sore Pain Intervention(s): Limited activity within patient's tolerance;Monitored during session    Home Living Family/patient expects to be discharged to:: Private residence Living Arrangements: Non-relatives/Friends Available Help at Discharge: Friend(s);Available PRN/intermittently;Family Type of Home: House Home Access: Stairs to enter Entrance Stairs-Rails: Left Entrance Stairs-Number of Steps: 4 Home Layout: Two level;Able to live on main level with bedroom/bathroom Home Equipment: Dan Humphreys - 2 wheels;Cane - quad      Prior Function Level of Independence: Independent         Comments: community ambulator, drives, works as Environmental health practitioner   Dominant Hand: Right    Extremity/Trunk Assessment   Upper Extremity Assessment Upper Extremity Assessment: Defer to OT evaluation    Lower Extremity Assessment Lower Extremity Assessment: Overall [place]  for tasks assessed;LLE deficits/detail LLE Deficits / Details: grossly 4+/5 except left foot not tested due to wound LLE Sensation: WNL LLE Coordination: WNL    Cervical / Trunk Assessment Cervical / Trunk Assessment: Normal  Communication   Communication: No difficulties  Cognition Arousal/Alertness: Awake/alert Behavior During Therapy: WFL for tasks assessed/performed Overall Cognitive Status: Within Functional Limits for tasks assessed                                         General Comments      Exercises     Assessment/Plan    PT Assessment Patent does not need any further PT services  PT Problem List         PT Treatment Interventions      PT Goals (Current goals can be found in the Care Plan section)  Acute Rehab PT Goals Patient Stated Goal: return home with family to assist PT Goal Formulation: With patient Time For Goal Achievement: 02/13/21 Potential to Achieve Goals: Good    Frequency     Barriers to discharge        Co-evaluation               AM-PAC PT "6 Clicks" Mobility  Outcome Measure Help needed turning from your back to your side while in a flat bed without using bedrails?: None Help needed moving from lying on your back to sitting on the side of a flat bed without using bedrails?: None Help needed moving to and from a bed to a chair (including a wheelchair)?: None Help needed standing up from a chair using your arms (e.g., wheelchair or bedside chair)?: None Help needed to walk in hospital room?: None Help needed climbing 3-5 steps with a railing? : None 6 Click Score: 24    End of Session   Activity Tolerance: Patient tolerated treatment well Patient left: in chair;with call bell/phone within reach Nurse Communication: Mobility status PT Visit Diagnosis: Unsteadiness on feet (R26.81);Other abnormalities of gait and mobility (R26.89);Muscle weakness (generalized) (M62.81)    Time: 6384-6659 PT Time Calculation (min) (ACUTE ONLY): 19 min   Charges:   PT Evaluation $PT Eval Low Complexity: 1 Low PT Treatments $Therapeutic Activity: 8-22 mins        1:45 PM, 02/13/21 Ocie Bob, MPT Physical Therapist with [facility] 336 [phone] office [phone] mobile phone

## 2021-02-13 NOTE — Evaluation (Signed)
Occupational Therapy Evaluation Patient Details Name: Adam Long MRN: [id number] DOB: August 09, 1976 Today's Date: 02/13/2021    History of Present Illness Adam Long is a 45 y.o. male with medical history significant for T2DM, prior aortic valve replacement, CAD, hypertension, hyperlipidemia and depression who presents to the emergency department due to some soreness to the lateral part of left foot few days ago, initially thought it was a blister wound, but realized that it was a wound yesterday due to foul-smelling odor from the wound.  He went to an urgent care and was asked to go to the ED for further evaluation.  Patient has had prior amputation of 2 toes to the same foot.  He denies any known injury, chest pain, shortness of breath fever, chills, pain from the wound.   Clinical Impression   Pt agreeable to OT evaluation this date. Pt demonstrates WFL UE strength and A/ROM. Pt able to go from supine to sit with Mod I using bed rail. Pt able to transfer to standing at sink from EOB and then to chair with independence. Pt appears to be functioning at or near baseline levels. Pt is not recommended for further OT services in the hospital setting and will be discharged to care of nursing staff for the remainder of his stay.     Follow Up Recommendations  No OT follow up    Equipment Recommendations  None recommended by OT           Precautions / Restrictions Precautions Precautions: None      Mobility Bed Mobility Overal bed mobility: Modified Independent             General bed mobility comments: use of railing    Transfers Overall transfer level: Independent                    Balance Overall balance assessment: Independent                                         ADL either performed or assessed with clinical judgement   ADL Overall ADL's : Independent                                             Vision Baseline  Vision/History: Wears glasses Wears Glasses: At all times Patient Visual Report: No change from baseline                  Pertinent Vitals/Pain Pain Assessment: No/denies pain     Hand Dominance Right   Extremity/Trunk Assessment Upper Extremity Assessment Upper Extremity Assessment: Overall WFL for tasks assessed   Lower Extremity Assessment Lower Extremity Assessment: Defer to PT evaluation   Cervical / Trunk Assessment Cervical / Trunk Assessment: Normal   Communication Communication Communication: No difficulties   Cognition Arousal/Alertness: Awake/alert Behavior During Therapy: WFL for tasks assessed/performed Overall Cognitive Status: Within Functional Limits for tasks assessed                                                      Home Living Family/patient expects to be discharged to:: Private  residence Living Arrangements: Non-relatives/Friends Available Help at Discharge: Friend(s);Available PRN/intermittently;Family Type of Home: House Home Access: Stairs to enter [employer] of Steps: 4 Entrance Stairs-Rails: Left Home Layout: Two level;Able to live on main level with bedroom/bathroom Alternate Level Stairs-Number of Steps: 10 to 15 Alternate Level Stairs-Rails: Right;Left;Can reach both Bathroom Shower/Tub: Tub/shower unit   Bathroom Toilet: Handicapped height     Home Equipment: Environmental consultant - 2 wheels;Cane - quad          Prior Functioning/Environment Level of Independence: Independent        Comments: Pt drives and works                      Leisure centre manager goals can be found in the care plan section) Acute Rehab OT Goals Patient Stated Goal: return home  OT Frequency:     Barriers to D/C:                                           End of Session    Activity Tolerance: Patient tolerated treatment well Patient left: in chair;with call bell/phone within reach  OT Visit  Diagnosis: Unsteadiness on feet (R26.81)                Time: 2633-3545 OT Time Calculation (min): 10 min Charges:  OT General Charges $OT Visit: 1 Visit OT Evaluation $OT Eval Low Complexity: 1 Low  Charelle Petrakis OT, MOT   Danie Chandler 02/13/2021, 9:50 AM

## 2021-02-13 NOTE — Plan of Care (Signed)

## 2021-02-14 DIAGNOSIS — L97521 Non-pressure chronic ulcer of other part of left foot limited to breakdown of skin: Secondary | ICD-10-CM

## 2021-02-14 DIAGNOSIS — E11628 Type 2 diabetes mellitus with other skin complications: Secondary | ICD-10-CM

## 2021-02-14 DIAGNOSIS — E10621 Type 1 diabetes mellitus with foot ulcer: Secondary | ICD-10-CM

## 2021-02-14 DIAGNOSIS — L089 Local infection of the skin and subcutaneous tissue, unspecified: Secondary | ICD-10-CM

## 2021-02-14 LAB — CBC
HCT: 39.3 % (ref 39.0–52.0)
Hemoglobin: 12.4 g/dL — ABNORMAL LOW (ref 13.0–17.0)
MCH: 28.6 pg (ref 26.0–34.0)
MCHC: 31.6 g/dL (ref 30.0–36.0)
MCV: 90.6 fL (ref 80.0–100.0)
Platelets: 252 10*3/uL (ref 150–400)
RBC: 4.34 MIL/uL (ref 4.22–5.81)
RDW: 14.2 % (ref 11.5–15.5)
WBC: 10.4 10*3/uL (ref 4.0–10.5)
nRBC: 0 % (ref 0.0–0.2)

## 2021-02-14 LAB — BASIC METABOLIC PANEL
Anion gap: 7 (ref 5–15)
BUN: 13 mg/dL (ref 6–20)
CO2: 28 mmol/L (ref 22–32)
Calcium: 8.9 mg/dL (ref 8.9–10.3)
Chloride: 100 mmol/L (ref 98–111)
Creatinine, Ser: 0.74 mg/dL (ref 0.61–1.24)
GFR, Estimated: >60 mL/min (ref >60)
Glucose, Bld: 133 mg/dL — ABNORMAL HIGH (ref 70–99)
Potassium: 4.3 mmol/L (ref 3.5–5.1)
Sodium: 135 mmol/L (ref 135–145)

## 2021-02-14 LAB — GLUCOSE, CAPILLARY
Glucose-Capillary: 102 mg/dL — ABNORMAL HIGH (ref 70–99)
Glucose-Capillary: 120 mg/dL — ABNORMAL HIGH (ref 70–99)
Glucose-Capillary: 143 mg/dL — ABNORMAL HIGH (ref 70–99)
Glucose-Capillary: 165 mg/dL — ABNORMAL HIGH (ref 70–99)
Glucose-Capillary: 91 mg/dL (ref 70–99)

## 2021-02-14 LAB — PROTIME-INR
INR: 1.9 — ABNORMAL HIGH (ref 0.8–1.2)
Prothrombin Time: 21.5 seconds — ABNORMAL HIGH (ref 11.4–15.2)

## 2021-02-14 MED ORDER — INSULIN ASPART 100 UNIT/ML IJ SOLN
0.0000 [IU] | Freq: Three times a day (TID) | INTRAMUSCULAR | Status: DC
  Administered 2021-02-14: 2 [IU] via SUBCUTANEOUS
  Administered 2021-02-15: 3 [IU] via SUBCUTANEOUS
  Administered 2021-02-15 – 2021-02-16 (×4): 1 [IU] via SUBCUTANEOUS
  Administered 2021-02-17: 2 [IU] via SUBCUTANEOUS

## 2021-02-14 NOTE — Progress Notes (Signed)
PROGRESS NOTE  Adam Long CBJ:[id number] DOB: Feb 22, 1976 DOA: 02/12/2021 PCP: Heather Roberts, NP  Brief History:  45 y.o.malewith medical history significant forT2DM, prior aortic valve replacement, CAD, hypertension, hyperlipidemia and depression who presents to the emergency department due to some soreness to the lateral part of left foot x 1 week.  He noted what he felt to be a blister on the lateral aspect of his left foot 1 week prior to admission.  He noted increase swelling, erythema and malodorous drainage over the next 2-3 days.  He went to urgent care whom directed him to go to the ED.  wound, but realized that it was a wound yesterday due to foul-smelling odor from the wound. Left foot x-ray showedsoft tissue swelling. No soft tissue air or radiopaque foreign body on x-ray.  Placed on vancomycin and zosyn.  WBC gradually trending down but left foot continued to have drainage, edema and erythema, not much improved.  MRI was neg for osteomyelitis but showed 22x16 mm abscess.  General surgery was consulted and felt that patient may be better served being evaluated by vascular surgery.  Patient had amputation of left 4th and left 5 th toes in Oct 2020.  Assessment/Plan:  Diabetic Foot Infection/abscess MRI unremarkable for osteomyelitis but showed 22 x 15 mm abscess Continue vancomycin, Zosyn   Continue wound care Continue PT/OT eval and treat General surgery consulted>>vascular eval Transfer to [facility] for I&D and vascular eval -Discussed with VVS, Dr. Edilia Bo  Lactic acidosis secondary to above Lactic acid 2.5,this may be due to dehydration, continue to trend lactic acid -sepsis ruled out -he has been afebrile and hemodynamically stable  Hypovolemic hyponatremia/Dehydration Na 132, continue IV hydration -improved with IVF  Normocytic anemia Nearbaseline hemoglobin: ranged within 10-14  Essential hypertension Continue lisinopril and Lopressor  when patient resumes oral intake  Hyperlipidemia Continue Lipitor when patient resumes oral intake  Hyperglycemia due to T2DM Continue SSI and hypoglycemia protocol 02/02/21 A1C--6.5  Status post mechanical aortic valve replacement Continue warfarin per home regimen -and per pharmacy consult Continue to monitor INR with goal range between 2.5-3.5  Morbid obesity (BMI of 50.94) Patient will be counseled on lifestyle and diet modification Patient will need to follow-up with outpatient PCP and dietitian for weight loss program      Status is: Inpatient  Remains inpatient appropriate because:IV treatments appropriate due to intensity of illness or inability to take PO   Dispo: The patient is from: Home              Anticipated d/c is to: Home              Patient currently is not medically stable to d/c.   Difficult to place patient No        Family Communication:  no Family at bedside  Consultants:  Vascular surgery  Code Status:  FULL   DVT Prophylaxis:  warfarin   Procedures: As Listed in Progress Note Above  Antibiotics: None  RN Pressure Injury Documentation:        Subjective: Patient states swelling and erythema not much better in left foot.  Patient denies fevers, chills, headache, chest pain, dyspnea, nausea, vomiting, diarrhea, abdominal pain, dysuria, hematuria, hematochezia, and melena.   Objective: Vitals:   02/13/21 1437 02/13/21 2113 02/14/21 0505 02/14/21 0821  BP: 102/65 114/75 114/68 103/72  Pulse: 78 82 71 75  Resp: 18 18 18    Temp: 97.6 F (  36.4 C) 97.8 F (36.6 C) 97.8 F (36.6 C)   TempSrc: Oral Oral Oral   SpO2: 100% 100% 100%   Weight:      Height:        Intake/Output Summary (Last 24 hours) at 02/14/2021 1215 Last data filed at 02/14/2021 0418 Gross per 24 hour  Intake 1450.3 ml  Output --  Net 1450.3 ml   Weight change:  Exam:   General:  Pt is alert, follows commands appropriately, not in acute  distress  HEENT: No icterus, No thrush, No neck mass, [place]/AT  Cardiovascular: RRR, S1/S2, no rubs, no gallops  Respiratory: CTA bilaterally, no wheezing, no crackles, no rhonchi  Abdomen: Soft/+BS, non tender, non distended, no guarding  Extremities: see pics        Data Reviewed: I have personally reviewed following labs and imaging studies Basic Metabolic Panel: Recent Labs  Lab 02/12/21 1455 02/13/21 0443 02/14/21 0533  NA 132* 133* 135  K 3.8 4.1 4.3  CL 98 99 100  CO2 25 27 28   GLUCOSE 135* 118* 133*  BUN 11 13 13   CREATININE 0.69 0.75 0.74  CALCIUM 8.9 8.7* 8.9  MG  --  1.8  --   PHOS  --  3.8  --    Liver Function Tests: Recent Labs  Lab 02/13/21 0443  AST 19  ALT 20  ALKPHOS 53  BILITOT 1.4*  PROT 7.0  ALBUMIN 3.0*   No results for input(s): LIPASE, AMYLASE in the last 168 hours. No results for input(s): AMMONIA in the last 168 hours. Coagulation Profile: Recent Labs  Lab 02/12/21 1455 02/13/21 0443 02/14/21 0533  INR 2.9* 2.8* 1.9*   CBC: Recent Labs  Lab 02/12/21 1455 02/13/21 0443 02/14/21 0533  WBC 15.7* 11.4* 10.4  NEUTROABS 12.7*  --   --   HGB 12.6* 12.5* 12.4*  HCT 39.0 38.8* 39.3  MCV 89.9 90.2 90.6  PLT 262 247 252   Cardiac Enzymes: No results for input(s): CKTOTAL, CKMB, CKMBINDEX, TROPONINI in the last 168 hours. BNP: Invalid input(s): POCBNP CBG: Recent Labs  Lab 02/13/21 1118 02/13/21 1626 02/14/21 0019 02/14/21 0736 02/14/21 1111  GLUCAP 173* 110* 102* 120* 165*   HbA1C: No results for input(s): HGBA1C in the last 72 hours. Urine analysis:    Component Value Date/Time   COLORURINE STRAW (A) 12/03/2019 1133   APPEARANCEUR CLEAR 12/03/2019 1133   LABSPEC 1.006 12/03/2019 1133   PHURINE 6.0 12/03/2019 1133   GLUCOSEU NEGATIVE 12/03/2019 1133   HGBUR NEGATIVE 12/03/2019 1133   BILIRUBINUR NEGATIVE 12/03/2019 1133   KETONESUR NEGATIVE 12/03/2019 1133   PROTEINUR NEGATIVE 12/03/2019 1133   UROBILINOGEN  0.2 08/31/2011 1727   NITRITE NEGATIVE 12/03/2019 1133   LEUKOCYTESUR NEGATIVE 12/03/2019 1133   Sepsis Labs: @LABRCNTIP (procalcitonin:4,lacticidven:4) ) Recent Results (from the past 240 hour(s))  Culture, blood (routine x 2)     Status: None (Preliminary result)   Collection Time: 02/12/21  6:09 PM   Specimen: BLOOD  Result Value Ref Range Status   Specimen Description BLOOD RIGHT ANTECUBITAL  Final   Special Requests   Final    Blood Culture adequate volume BOTTLES DRAWN AEROBIC AND ANAEROBIC   Culture   Final    NO GROWTH 2 DAYS Performed at [facility], [address], [place], [facility] [address]    Report Status PENDING  Incomplete  Culture, blood (routine x 2)     Status: None (Preliminary result)   Collection Time: 02/12/21  6:09 PM  Specimen: BLOOD LEFT ARM  Result Value Ref Range Status   Specimen Description BLOOD LEFT ARM  Final   Special Requests   Final    Blood Culture adequate volume BOTTLES DRAWN AEROBIC AND ANAEROBIC   Culture   Final    NO GROWTH 2 DAYS Performed at [facility], [address] [place], [place] [address]    Report Status PENDING  Incomplete  Resp Panel by RT-PCR (Flu A&B, Covid) Nasopharyngeal Swab     Status: None   Collection Time: 02/12/21  9:00 PM   Specimen: Nasopharyngeal Swab; Nasopharyngeal(NP) swabs in vial transport medium  Result Value Ref Range Status   SARS Coronavirus 2 by RT PCR NEGATIVE NEGATIVE Final    Comment: (NOTE) SARS-CoV-2 target nucleic acids are NOT DETECTED.  The SARS-CoV-2 RNA is generally detectable in upper respiratory specimens during the acute phase of infection. The lowest concentration of SARS-CoV-2 viral copies this assay can detect is 138 copies/mL. A negative result does not preclude SARS-Cov-2 infection and should not be used as the sole basis for treatment or other patient management decisions. A negative result may occur with  improper specimen collection/handling, submission of specimen  other than nasopharyngeal swab, presence of viral mutation(s) within the areas targeted by this assay, and inadequate number of viral copies(<138 copies/mL). A negative result must be combined with clinical observations, patient history, and epidemiological information. The expected result is Negative.  Fact Sheet for Patients:  BloggerCourse.com  Fact Sheet for Healthcare Providers:  SeriousBroker.it  This test is no t yet approved or cleared by the [place] FDA and  has been authorized for detection and/or diagnosis of SARS-CoV-2 by FDA under an Emergency Use Authorization (EUA). This EUA will remain  in effect (meaning this test can be used) for the duration of the COVID-19 declaration under Section 564(b)(1) of the Act, 21 U.S.C.section 360bbb-3(b)(1), unless the authorization is terminated  or revoked sooner.       Influenza A by PCR NEGATIVE NEGATIVE Final   Influenza B by PCR NEGATIVE NEGATIVE Final    Comment: (NOTE) The Xpert Xpress SARS-CoV-2/FLU/RSV plus assay is intended as an aid in the diagnosis of influenza from Nasopharyngeal swab specimens and should not be used as a sole basis for treatment. Nasal washings and aspirates are unacceptable for Xpert Xpress SARS-CoV-2/FLU/RSV testing.  Fact Sheet for Patients: BloggerCourse.com  Fact Sheet for Healthcare Providers: SeriousBroker.it  This test is not yet approved or cleared by the [place] FDA and has been authorized for detection and/or diagnosis of SARS-CoV-2 by FDA under an Emergency Use Authorization (EUA). This EUA will remain in effect (meaning this test can be used) for the duration of the COVID-19 declaration under Section 564(b)(1) of the Act, 21 U.S.C. section 360bbb-3(b)(1), unless the authorization is terminated or revoked.  Performed at [facility], [address] [place], [place]  [address]      Scheduled Meds: . atorvastatin  80 mg Oral q1800  . insulin aspart  0-9 Units Subcutaneous TID WC  . lisinopril  20 mg Oral Daily  . metoprolol tartrate  25 mg Oral BID  . sertraline  50 mg Oral Daily   Continuous Infusions: . piperacillin-tazobactam (ZOSYN)  IV 3.375 g (02/14/21 0500)  . vancomycin 2,000 mg (02/14/21 0958)    Procedures/Studies: MR FOOT LEFT WO CONTRAST  Result Date: 02/13/2021 CLINICAL DATA:  History of prior amputations with open wound on the lateral aspect of the forefoot. EXAM: MRI OF THE LEFT FOOT WITHOUT  CONTRAST TECHNIQUE: Multiplanar, multisequence MR imaging of the left was performed. No intravenous contrast was administered. COMPARISON:  Radiographs, same date.  Prior MRI from 2020. FINDINGS: Postoperative changes related to prior amputations involving the fourth and fifth toes. There is an open wound on the plantar aspect of the forefoot laterally. There is an underlying fluid collection consistent with an abscess measuring approximately 22 x 16 mm. This abscess is right down on the osteotomy site at the proximal phalanx of the fifth toe. No definite findings for osteomyelitis. Surrounding cellulitis and myofasciitis. No findings suspicious for pyomyositis. There is a complex multi septated cystic lesion noted between the first and second metatarsals measuring approximately 4.0 x 2.5 cm. This is most likely a ganglion cyst given its appearance and appears to be emanating from the first or second MTP joints. Moderate stable appearing midfoot degenerative changes with joint space narrowing, subchondral cystic change and marrow edema. IMPRESSION: 1. Open wound on the plantar aspect of the forefoot laterally with an underlying 22 x 16 mm abscess. 2. No definite findings for osteomyelitis. 3. Surrounding cellulitis and myofasciitis. 4. 4.0 x 2.5 cm complex multi septated cystic lesion between the first and second metatarsals. This is most likely a ganglion cyst.  5. Moderate stable appearing midfoot degenerative changes. Electronically Signed   By: Rudie MeyerP.  Gallerani M.D.   On: 02/13/2021 08:05   DG Foot Complete Left  Result Date: 02/12/2021 CLINICAL DATA:  Foot ulcer EXAM: LEFT FOOT - COMPLETE 3+ VIEW COMPARISON:  June 11, 2019 FINDINGS: Frontal, oblique, and lateral views obtained. There is been amputation at the level of the proximal fourth and fifth proximal phalanges. There is soft tissue swelling dorsally. No acute fracture or dislocation. Remaining joint spaces appear normal. No erosive change or bony destruction. No soft tissue air or radiopaque foreign body. IMPRESSION: Soft tissue swelling. No soft tissue air or radiopaque foreign body. Status post amputations at the proximal fourth and fifth proximal phalangeal levels. Remaining bony structures appear intact. No bony destruction or erosion. No fracture or dislocation. Remaining joint spaces normal. Electronically Signed   By: Bretta BangWilliam  Woodruff III M.D.   On: 02/12/2021 14:38    Catarina Hartshornavid Homero Hyson, DO  [facility]  If 7PM-7AM, please contact night-coverage [email or web address] Password [email or web address] 02/14/2021, 12:15 PM   LOS: 2 days

## 2021-02-14 NOTE — Consult Note (Signed)
Reason for Consult: Cellulitis, left foot Referring Physician: Dr. [name] is an 45 y.o. male.  HPI: Patient is a 45 year old white male with multiple medical problems, status post amputation of left fourth and fifth toes by Dr. Tawanna Long earlier vascular surgery in 2020 who states he had switched jobs and was walking a lot more.  He developed a blister and subsequent wound along the lateral aspect of the left foot at the previous amputation site.  He presented to the emergency room with worsening redness and tenderness.  An MRI was performed which revealed no osteomyelitis, but a small soft tissue fluid collection consistent with abscess.  This was just beneath the open wound.  Surgery has been consulted to address this.  He has been on Coumadin as an outpatient for aortic valve replacement, though he was supratherapeutic at the time of admission.  Today, his INR is 2.1.  Past Medical History:  Diagnosis Date  . Anxiety   . Aortic stenosis   . Cellulitis and abscess of lower extremity 06/11/2019  . Cellulitis of fourth toe of left foot   . Cholelithiasis   . Coronary artery disease    Nonobstructive CAD (40-50% LAD) 08/2019  . Depression   . Depression    Phreesia 09/27/2020  . Depression    Phreesia 11/01/2020  . Diabetes mellitus without complication (HCC)    Phreesia 09/27/2020  . Elevated troponin level not due myocardial infarction 11/11/2019  . Essential hypertension   . Gangrene of toe of left foot (HCC) 07/06/2019  . Heart murmur    Phreesia 09/27/2020  . Hyperlipidemia    Phreesia 09/27/2020  . Hypertension    Phreesia 09/27/2020  . Mixed hyperlipidemia   . Morbid obesity (HCC)   . S/P aortic valve replacement with mechanical valve 12/05/2019   25 mm Carbomedics top hat bileaflet mechanical valve via partial upper hemi-sternotomy  . Severe aortic stenosis 09/24/2019  . Type 2 diabetes mellitus (HCC)     Past Surgical History:  Procedure Laterality Date  .  ABDOMINAL AORTOGRAM W/LOWER EXTREMITY N/A 07/06/2019   Procedure: ABDOMINAL AORTOGRAM W/LOWER EXTREMITY;  Surgeon: Sherren Kerns, MD;  Location: [facility] INVASIVE CV LAB;  Service: Cardiovascular;  Laterality: N/A;  Bilateral  . AMPUTATION Left 07/09/2019   Procedure: LEFT FOURTH and Fifth TOE AMPUTATION.;  Surgeon: Larina Earthly, MD;  Location: [facility] OR;  Service: Vascular;  Laterality: Left;  . AORTIC VALVE REPLACEMENT N/A 12/05/2019   Procedure: PARTIAL STERNOTOMY FOR AORTIC VALVE REPLACEMENT (AVR), USING CARBOMEDICS SUPRA-ANNULAR TOP HAT ;  Surgeon: Purcell Nails, MD;  Location: [facility] OR;  Service: Open Heart Surgery;  Laterality: N/A;  No neck lines on left  . CARDIAC VALVE REPLACEMENT N/A    Phreesia 09/27/2020  . IR RADIOLOGY PERIPHERAL GUIDED IV START  10/05/2019  . IR US GUIDE VASC ACCESS RIGHT  10/05/2019  . MULTIPLE EXTRACTIONS WITH ALVEOLOPLASTY N/A 10/26/2019   Procedure: EXTRACTION OF TOOTH #'S 3, 5-11,19-28,  AND 32 WITH ALVEOLOPLASTY;  Surgeon: Charlynne Pander, DDS;  Location: [facility] OR;  Service: Oral Surgery;  Laterality: N/A;  . RIGHT HEART CATH AND CORONARY ANGIOGRAPHY N/A 09/24/2019   Procedure: RIGHT HEART CATH AND CORONARY ANGIOGRAPHY;  Surgeon: Tonny Bollman, MD;  Location: [facility] INVASIVE CV LAB;  Service: Cardiovascular;  Laterality: N/A;  . TEE WITHOUT CARDIOVERSION N/A 12/05/2019   Procedure: TRANSESOPHAGEAL ECHOCARDIOGRAM (TEE);  Surgeon: Purcell Nails, MD;  Location: [facility] OR;  Service: Open Heart Surgery;  Laterality: N/A;  Family History  Problem Relation Age of Onset  . Cancer Mother        Brain  . Heart disease Father   . Hyperlipidemia Father   . Hypertension Father   . Stroke Father   . Heart murmur Sister     Social History:  reports that he has never smoked. He has quit using smokeless tobacco.  His smokeless tobacco use included chew. He reports previous alcohol use. He reports that he does not use drugs.  Allergies:  Allergies  Allergen Reactions  .  Pollen Extract Other (See Comments)    Runny nose, watery eyes, sneezing    Medications: I have reviewed the patient's current medications.  Results for orders placed or performed during the hospital encounter of 02/12/21 (from the past 48 hour(s))  Basic metabolic panel     Status: Abnormal   Collection Time: 02/12/21  2:55 PM  Result Value Ref Range   Sodium 132 (L) 135 - 145 mmol/L   Potassium 3.8 3.5 - 5.1 mmol/L   Chloride 98 98 - 111 mmol/L   CO2 25 22 - 32 mmol/L   Glucose, Bld 135 (H) 70 - 99 mg/dL    Comment: Glucose reference range applies only to samples taken after fasting for at least 8 hours.   BUN 11 6 - 20 mg/dL   Creatinine, Ser 0.30 0.61 - 1.24 mg/dL   Calcium 8.9 8.9 - 09.2 mg/dL   GFR, Estimated >33 >00 mL/min    Comment: (NOTE) Calculated using the CKD-EPI Creatinine Equation (2021)    Anion gap 9 5 - 15    Comment: Performed at [facility], [address] [place], [place] [address]  CBC with Differential     Status: Abnormal   Collection Time: 02/12/21  2:55 PM  Result Value Ref Range   WBC 15.7 (H) 4.0 - 10.5 K/uL   RBC 4.34 4.22 - 5.81 MIL/uL   Hemoglobin 12.6 (L) 13.0 - 17.0 g/dL   HCT 33.3 54.5 - 62.5 %   MCV 89.9 80.0 - 100.0 fL   MCH 29.0 26.0 - 34.0 pg   MCHC 32.3 30.0 - 36.0 g/dL   RDW 63.8 93.7 - 34.2 %   Platelets 262 150 - 400 K/uL   nRBC 0.0 0.0 - 0.2 %   Neutrophils Relative % 80 %   Neutro Abs 12.7 (H) 1.7 - 7.7 K/uL   Lymphocytes Relative 10 %   Lymphs Abs 1.6 0.7 - 4.0 K/uL   Monocytes Relative 8 %   Monocytes Absolute 1.2 (H) 0.1 - 1.0 K/uL   Eosinophils Relative 1 %   Eosinophils Absolute 0.1 0.0 - 0.5 K/uL   Basophils Relative 0 %   Basophils Absolute 0.1 0.0 - 0.1 K/uL   Immature Granulocytes 1 %   Abs Immature Granulocytes 0.11 (H) 0.00 - 0.07 K/uL    Comment: Performed at [facility], [address], [place], [place] [address]  Lactic acid, plasma     Status: Abnormal   Collection Time: 02/12/21  2:55 PM  Result Value  Ref Range   Lactic Acid, Venous 2.5 (HH) 0.5 - 1.9 mmol/L    Comment: CRITICAL RESULT CALLED TO, READ BACK BY AND VERIFIED WITH: KENDRICK,J ON 02/12/21 AT 1635 BY LOY,C Performed at [facility], [address], [place], [place] [address]   Protime-INR     Status: Abnormal   Collection Time: 02/12/21  2:55 PM  Result Value Ref Range   Prothrombin Time 30.4 (H)  11.4 - 15.2 seconds   INR 2.9 (H) 0.8 - 1.2    Comment: (NOTE) INR goal varies based on device and disease states. Performed at [facility], [address], [place], [place] [address]   Sedimentation rate     Status: Abnormal   Collection Time: 02/12/21  3:01 PM  Result Value Ref Range   Sed Rate 62 (H) 0 - 16 mm/hr    Comment: Performed at [facility], [address] [place], [place] [address]  Culture, blood (routine x 2)     Status: None (Preliminary result)   Collection Time: 02/12/21  6:09 PM   Specimen: BLOOD  Result Value Ref Range   Specimen Description BLOOD RIGHT ANTECUBITAL    Special Requests      Blood Culture adequate volume BOTTLES DRAWN AEROBIC AND ANAEROBIC   Culture      NO GROWTH 2 DAYS Performed at [facility], [address], [place], [place] [address]    Report Status PENDING   Culture, blood (routine x 2)     Status: None (Preliminary result)   Collection Time: 02/12/21  6:09 PM   Specimen: BLOOD LEFT ARM  Result Value Ref Range   Specimen Description BLOOD LEFT ARM    Special Requests      Blood Culture adequate volume BOTTLES DRAWN AEROBIC AND ANAEROBIC   Culture      NO GROWTH 2 DAYS Performed at [facility], [address] [place], [place] [address]    Report Status PENDING   Resp Panel by RT-PCR (Flu A&B, Covid) Nasopharyngeal Swab     Status: None   Collection Time: 02/12/21  9:00 PM   Specimen: Nasopharyngeal Swab; Nasopharyngeal(NP) swabs in vial transport medium  Result Value Ref Range   SARS Coronavirus 2 by RT PCR NEGATIVE NEGATIVE    Comment: (NOTE) SARS-CoV-2 target nucleic  acids are NOT DETECTED.  The SARS-CoV-2 RNA is generally detectable in upper respiratory specimens during the acute phase of infection. The lowest concentration of SARS-CoV-2 viral copies this assay can detect is 138 copies/mL. A negative result does not preclude SARS-Cov-2 infection and should not be used as the sole basis for treatment or other patient management decisions. A negative result may occur with  improper specimen collection/handling, submission of specimen other than nasopharyngeal swab, presence of viral mutation(s) within the areas targeted by this assay, and inadequate number of viral copies(<138 copies/mL). A negative result must be combined with clinical observations, patient history, and epidemiological information. The expected result is Negative.  Fact Sheet for Patients:  BloggerCourse.com  Fact Sheet for Healthcare Providers:  SeriousBroker.it  This test is no t yet approved or cleared by the [place] FDA and  has been authorized for detection and/or diagnosis of SARS-CoV-2 by FDA under an Emergency Use Authorization (EUA). This EUA will remain  in effect (meaning this test can be used) for the duration of the COVID-19 declaration under Section 564(b)(1) of the Act, 21 U.S.C.section 360bbb-3(b)(1), unless the authorization is terminated  or revoked sooner.       Influenza A by PCR NEGATIVE NEGATIVE   Influenza B by PCR NEGATIVE NEGATIVE    Comment: (NOTE) The Xpert Xpress SARS-CoV-2/FLU/RSV plus assay is intended as an aid in the diagnosis of influenza from Nasopharyngeal swab specimens and should not be used as a sole basis for treatment. Nasal washings and aspirates are unacceptable for Xpert Xpress SARS-CoV-2/FLU/RSV testing.  Fact Sheet for Patients: BloggerCourse.com  Fact Sheet for Healthcare Providers: SeriousBroker.it  This test is not  yet approved or cleared by the [place] and has been authorized for detection and/or diagnosis of SARS-CoV-2 by FDA under an Emergency Use Authorization (EUA). This EUA will remain in effect (meaning this test can be used) for the duration of the COVID-19 declaration under Section 564(b)(1) of the Act, 21 U.S.C. section 360bbb-3(b)(1), unless the authorization is terminated or revoked.  Performed at [facility], [address] [place], [place] [address]   Glucose, capillary     Status: Abnormal   Collection Time: 02/12/21 11:29 PM  Result Value Ref Range   Glucose-Capillary 147 (H) 70 - 99 mg/dL    Comment: Glucose reference range applies only to samples taken after fasting for at least 8 hours.  HIV Antibody (routine testing w rflx)     Status: None   Collection Time: 02/13/21  4:43 AM  Result Value Ref Range   HIV Screen 4th Generation wRfx Non Reactive Non Reactive    Comment: Performed at [facility], [address] [place], [place] [address]  Comprehensive metabolic panel     Status: Abnormal   Collection Time: 02/13/21  4:43 AM  Result Value Ref Range   Sodium 133 (L) 135 - 145 mmol/L   Potassium 4.1 3.5 - 5.1 mmol/L   Chloride 99 98 - 111 mmol/L   CO2 27 22 - 32 mmol/L   Glucose, Bld 118 (H) 70 - 99 mg/dL    Comment: Glucose reference range applies only to samples taken after fasting for at least 8 hours.   BUN 13 6 - 20 mg/dL   Creatinine, Ser 0.98 0.61 - 1.24 mg/dL   Calcium 8.7 (L) 8.9 - 10.3 mg/dL   Total Protein 7.0 6.5 - 8.1 g/dL   Albumin 3.0 (L) 3.5 - 5.0 g/dL   AST 19 15 - 41 U/L   ALT 20 0 - 44 U/L   Alkaline Phosphatase 53 38 - 126 U/L   Total Bilirubin 1.4 (H) 0.3 - 1.2 mg/dL   GFR, Estimated >11 >91 mL/min    Comment: (NOTE) Calculated using the CKD-EPI Creatinine Equation (2021)    Anion gap 7 5 - 15    Comment: Performed at [facility], [address] [place], [place] [address]  CBC     Status: Abnormal   Collection Time: 02/13/21   4:43 AM  Result Value Ref Range   WBC 11.4 (H) 4.0 - 10.5 K/uL   RBC 4.30 4.22 - 5.81 MIL/uL   Hemoglobin 12.5 (L) 13.0 - 17.0 g/dL   HCT 56.2 (L) 13.0 - 86.5 %   MCV 90.2 80.0 - 100.0 fL   MCH 29.1 26.0 - 34.0 pg   MCHC 32.2 30.0 - 36.0 g/dL   RDW 78.4 69.6 - 29.5 %   Platelets 247 150 - 400 K/uL   nRBC 0.0 0.0 - 0.2 %    Comment: Performed at [facility], [address] [place], [place] [address]  Protime-INR     Status: Abnormal   Collection Time: 02/13/21  4:43 AM  Result Value Ref Range   Prothrombin Time 29.8 (H) 11.4 - 15.2 seconds   INR 2.8 (H) 0.8 - 1.2    Comment: (NOTE) INR goal varies based on device and disease states. Performed at [facility], [address] [place], [place] [address]   APTT     Status: Abnormal   Collection Time: 02/13/21  4:43 AM  Result Value Ref Range   aPTT  86 (H) 24 - 36 seconds    Comment:        IF BASELINE aPTT IS ELEVATED, SUGGEST PATIENT RISK ASSESSMENT BE USED TO DETERMINE APPROPRIATE ANTICOAGULANT THERAPY. Performed at [facility], [address], [place], [place] [address]   Magnesium     Status: None   Collection Time: 02/13/21  4:43 AM  Result Value Ref Range   Magnesium 1.8 1.7 - 2.4 mg/dL    Comment: Performed at [facility], [address] [place], [place] [address]  Phosphorus     Status: None   Collection Time: 02/13/21  4:43 AM  Result Value Ref Range   Phosphorus 3.8 2.5 - 4.6 mg/dL    Comment: Performed at [facility], [address], [place], [place] [address]  Lactic acid, plasma     Status: None   Collection Time: 02/13/21  4:43 AM  Result Value Ref Range   Lactic Acid, Venous 1.2 0.5 - 1.9 mmol/L    Comment: Performed at [facility], [address] [place], [place] [address]  Lactic acid, plasma     Status: None   Collection Time: 02/13/21  7:11 AM  Result Value Ref Range   Lactic Acid, Venous 0.9 0.5 - 1.9 mmol/L    Comment: Performed at [facility], [address] [place], [place] [address]   Glucose, capillary     Status: Abnormal   Collection Time: 02/13/21  8:17 AM  Result Value Ref Range   Glucose-Capillary 134 (H) 70 - 99 mg/dL    Comment: Glucose reference range applies only to samples taken after fasting for at least 8 hours.  Glucose, capillary     Status: Abnormal   Collection Time: 02/13/21 11:18 AM  Result Value Ref Range   Glucose-Capillary 173 (H) 70 - 99 mg/dL    Comment: Glucose reference range applies only to samples taken after fasting for at least 8 hours.  Glucose, capillary     Status: Abnormal   Collection Time: 02/13/21  4:26 PM  Result Value Ref Range   Glucose-Capillary 110 (H) 70 - 99 mg/dL    Comment: Glucose reference range applies only to samples taken after fasting for at least 8 hours.  Glucose, capillary     Status: Abnormal   Collection Time: 02/14/21 12:19 AM  Result Value Ref Range   Glucose-Capillary 102 (H) 70 - 99 mg/dL    Comment: Glucose reference range applies only to samples taken after fasting for at least 8 hours.  Protime-INR     Status: Abnormal   Collection Time: 02/14/21  5:33 AM  Result Value Ref Range   Prothrombin Time 21.5 (H) 11.4 - 15.2 seconds   INR 1.9 (H) 0.8 - 1.2    Comment: (NOTE) INR goal varies based on device and disease states. Performed at [facility], [address] [place], [place] [address]   CBC     Status: Abnormal   Collection Time: 02/14/21  5:33 AM  Result Value Ref Range   WBC 10.4 4.0 - 10.5 K/uL   RBC 4.34 4.22 - 5.81 MIL/uL   Hemoglobin 12.4 (L) 13.0 - 17.0 g/dL   HCT 25.9 56.3 - 87.5 %   MCV 90.6 80.0 - 100.0 fL   MCH 28.6 26.0 - 34.0 pg   MCHC 31.6 30.0 - 36.0 g/dL   RDW 64.3 32.9 - 51.8 %   Platelets 252 150 - 400 K/uL   nRBC 0.0 0.0 - 0.2 %    Comment: Performed  at [facility], [address] [place] [address] [place], [place] [address]  Basic metabolic panel     Status: Abnormal   Collection Time: 02/14/21  5:33 AM  Result Value Ref Range   Sodium 135 135 - 145 mmol/L   Potassium 4.3 3.5 -  5.1 mmol/L   Chloride 100 98 - 111 mmol/L   CO2 28 22 - 32 mmol/L   Glucose, Bld 133 (H) 70 - 99 mg/dL    Comment: Glucose reference range applies only to samples taken after fasting for at least 8 hours.   BUN 13 6 - 20 mg/dL   Creatinine, Ser 1.910.74 0.61 - 1.24 mg/dL   Calcium 8.9 8.9 - 47.810.3 mg/dL   GFR, Estimated >29>60 >56>60 mL/min    Comment: (NOTE) Calculated using the CKD-EPI Creatinine Equation (2021)    Anion gap 7 5 - 15    Comment: Performed at [facility], [address] [place], [place] [address]  Glucose, capillary     Status: Abnormal   Collection Time: 02/14/21  7:36 AM  Result Value Ref Range   Glucose-Capillary 120 (H) 70 - 99 mg/dL    Comment: Glucose reference range applies only to samples taken after fasting for at least 8 hours.    MR FOOT LEFT WO CONTRAST  Result Date: 02/13/2021 CLINICAL DATA:  History of prior amputations with open wound on the lateral aspect of the forefoot. EXAM: MRI OF THE LEFT FOOT WITHOUT CONTRAST TECHNIQUE: Multiplanar, multisequence MR imaging of the left was performed. No intravenous contrast was administered. COMPARISON:  Radiographs, same date.  Prior MRI from 2020. FINDINGS: Postoperative changes related to prior amputations involving the fourth and fifth toes. There is an open wound on the plantar aspect of the forefoot laterally. There is an underlying fluid collection consistent with an abscess measuring approximately 22 x 16 mm. This abscess is right down on the osteotomy site at the proximal phalanx of the fifth toe. No definite findings for osteomyelitis. Surrounding cellulitis and myofasciitis. No findings suspicious for pyomyositis. There is a complex multi septated cystic lesion noted between the first and second metatarsals measuring approximately 4.0 x 2.5 cm. This is most likely a ganglion cyst given its appearance and appears to be emanating from the first or second MTP joints. Moderate stable appearing midfoot degenerative changes  with joint space narrowing, subchondral cystic change and marrow edema. IMPRESSION: 1. Open wound on the plantar aspect of the forefoot laterally with an underlying 22 x 16 mm abscess. 2. No definite findings for osteomyelitis. 3. Surrounding cellulitis and myofasciitis. 4. 4.0 x 2.5 cm complex multi septated cystic lesion between the first and second metatarsals. This is most likely a ganglion cyst. 5. Moderate stable appearing midfoot degenerative changes. Electronically Signed   By: Rudie MeyerP.  Gallerani M.D.   On: 02/13/2021 08:05   DG Foot Complete Left  Result Date: 02/12/2021 CLINICAL DATA:  Foot ulcer EXAM: LEFT FOOT - COMPLETE 3+ VIEW COMPARISON:  June 11, 2019 FINDINGS: Frontal, oblique, and lateral views obtained. There is been amputation at the level of the proximal fourth and fifth proximal phalanges. There is soft tissue swelling dorsally. No acute fracture or dislocation. Remaining joint spaces appear normal. No erosive change or bony destruction. No soft tissue air or radiopaque foreign body. IMPRESSION: Soft tissue swelling. No soft tissue air or radiopaque foreign body. Status post amputations at the proximal fourth and fifth proximal phalangeal levels. Remaining bony structures appear intact. No bony destruction or erosion. No fracture or dislocation.  Remaining joint spaces normal. Electronically Signed   By: Bretta Bang III M.D.   On: 02/12/2021 14:38    ROS:  Pertinent items are noted in HPI.  Blood pressure 103/72, pulse 75, temperature 97.8 F (36.6 C), temperature source Oral, resp. rate 18, height  (1.778 m), weight (!) 161 kg, SpO2 100 %. Physical Exam: Morbidly obese white male in no acute distress Head is normocephalic, atraumatic Lungs clear to auscultation with good breath sounds bilaterally Extremity examination reveals erythema on the dorsal aspect of the right foot laterally extending towards the mid foot.  The left foot is slightly more swollen.  It was  difficult to appreciate pedal pulses.  A wound is present that measures approximately 1.5 cm in greatest diameter with necrotic tissue present laterally a surgical scar over the fifth metatarsal head.  Sharp debridement of the soft tissue was performed.  Minimal purulent fluid was expressed.  MRI results reviewed Assessment/Plan: Impression: Patient with multiple comorbidities including peripheral vascular disease, aortic valve replacement, chronic anticoagulation, morbid obesity, type 2 diabetes who presents with cellulitis with a wound along the lateral aspect of the left foot, status post amputation of fourth and fifth toes in 2020.  Patient does not have a leukocytosis.  He does have cellulitis of the soft tissue around the open wound.  Would continue IV antibiotics for now.  Will write for dressing changes.  Patient would like to see if he can be discharged tomorrow which is fine with me.  Will reevaluate at that time.  He is also wanting to be arranged a follow-up with podiatry for diabetic foot care.  Adam Long 02/14/2021, 10:43 AM

## 2021-02-15 DIAGNOSIS — E11621 Type 2 diabetes mellitus with foot ulcer: Secondary | ICD-10-CM

## 2021-02-15 LAB — COMPREHENSIVE METABOLIC PANEL
ALT: 25 U/L (ref 0–44)
AST: 22 U/L (ref 15–41)
Albumin: 3.2 g/dL — ABNORMAL LOW (ref 3.5–5.0)
Alkaline Phosphatase: 54 U/L (ref 38–126)
Anion gap: 6 (ref 5–15)
BUN: 13 mg/dL (ref 6–20)
CO2: 28 mmol/L (ref 22–32)
Calcium: 8.6 mg/dL — ABNORMAL LOW (ref 8.9–10.3)
Chloride: 100 mmol/L (ref 98–111)
Creatinine, Ser: 0.71 mg/dL (ref 0.61–1.24)
GFR, Estimated: >60 mL/min (ref >60)
Glucose, Bld: 117 mg/dL — ABNORMAL HIGH (ref 70–99)
Potassium: 4.2 mmol/L (ref 3.5–5.1)
Sodium: 134 mmol/L — ABNORMAL LOW (ref 135–145)
Total Bilirubin: 1.1 mg/dL (ref 0.3–1.2)
Total Protein: 7.5 g/dL (ref 6.5–8.1)

## 2021-02-15 LAB — CBC
HCT: 41.1 % (ref 39.0–52.0)
Hemoglobin: 13 g/dL (ref 13.0–17.0)
MCH: 28.6 pg (ref 26.0–34.0)
MCHC: 31.6 g/dL (ref 30.0–36.0)
MCV: 90.3 fL (ref 80.0–100.0)
Platelets: 284 10*3/uL (ref 150–400)
RBC: 4.55 MIL/uL (ref 4.22–5.81)
RDW: 14.1 % (ref 11.5–15.5)
WBC: 11.1 10*3/uL — ABNORMAL HIGH (ref 4.0–10.5)
nRBC: 0 % (ref 0.0–0.2)

## 2021-02-15 LAB — PROTIME-INR
INR: 1.6 — ABNORMAL HIGH (ref 0.8–1.2)
Prothrombin Time: 18.7 seconds — ABNORMAL HIGH (ref 11.4–15.2)

## 2021-02-15 LAB — GLUCOSE, CAPILLARY
Glucose-Capillary: 140 mg/dL — ABNORMAL HIGH (ref 70–99)
Glucose-Capillary: 212 mg/dL — ABNORMAL HIGH (ref 70–99)
Glucose-Capillary: 103 mg/dL — ABNORMAL HIGH (ref 70–99)
Glucose-Capillary: 123 mg/dL — ABNORMAL HIGH (ref 70–99)

## 2021-02-15 LAB — HEPARIN LEVEL (UNFRACTIONATED): Heparin Unfractionated: <0.1 IU/mL — ABNORMAL LOW (ref 0.30–0.70)

## 2021-02-15 LAB — C-REACTIVE PROTEIN: CRP: 5.8 mg/dL — ABNORMAL HIGH (ref <1.0)

## 2021-02-15 MED ORDER — ENOXAPARIN SODIUM 150 MG/ML IJ SOSY
150.0000 mg | PREFILLED_SYRINGE | Freq: Two times a day (BID) | INTRAMUSCULAR | Status: DC
  Filled 2021-02-15: qty 1

## 2021-02-15 MED ORDER — HEPARIN (PORCINE) 25000 UT/250ML-% IV SOLN
2800.0000 [IU]/h | INTRAVENOUS | Status: DC
  Administered 2021-02-15: 1600 [IU]/h via INTRAVENOUS
  Filled 2021-02-15 (×4): qty 250

## 2021-02-15 MED ORDER — HEPARIN BOLUS VIA INFUSION
2000.0000 [IU] | Freq: Once | INTRAVENOUS | Status: AC
  Administered 2021-02-15: 2000 [IU] via INTRAVENOUS
  Filled 2021-02-15: qty 2000

## 2021-02-15 NOTE — Progress Notes (Signed)
NEW ADMISSION NOTE New Admission Note:   Arrival Method: [facility] stretcher bed Mental Orientation:  Alert and oriented x 4 Telemetry: None ordered. Assessment: Completed Skin: Diabetic left foot wound -assessed with Vonna Kotyk R.N. IV: Rt A/C infusing with heparin @ 16 ml/hr and Zozyn @ 12.5 cc  Ml/hr. Pain:Denies Tubes:None Safety Measures: Safety Fall Prevention Plan has been given, discussed and signed Admission: Completed [facility] Orientation: Patient has been orientated to the room, unit and staff.  Family: None at the bedside.              Discussed visitation policy.              M.D made awre of patient's arrival on the floor.  Orders have been reviewed and implemented. Will continue to monitor the patient. Call light has been placed within reach and bed alarm has been activated.   [place], Kem Kays, RN

## 2021-02-15 NOTE — Progress Notes (Addendum)
PROGRESS NOTE  Adam Long DOB: 29-Apr-1976 DOA: 02/12/2021 PCP: Adam Long Brief History:  45 y.o.malewith medical history significant forT2DM, prior aortic valve replacement, CAD, hypertension, hyperlipidemia and depression who presents to the emergency department due to some soreness to the lateral part of left foot x 1 week.  He noted what he felt to be a blister on the lateral aspect of his left foot 1 week prior to admission.  He noted increase swelling, erythema and malodorous drainage over the next 2-3 days.  He went to urgent care whom directed him to go to the ED.  wound, but realized that it was a wound yesterday due to foul-smelling odor from the wound. Left foot x-ray showedsoft tissue swelling. No soft tissue air or radiopaque foreign bodyon x-ray.Placed onvancomycin and zosyn.  WBC gradually trending down but left foot continued to have drainage, edema and erythema, not much improved.  MRI was neg for osteomyelitis but showed 22x16 mm abscess.  General surgery was consulted and felt that patient may be better served being evaluated by vascular surgery.  Patient had amputation of left 4th and left 5 th toes in Oct 2020.  Assessment/Plan:  Diabetic Foot Infection/abscess MRI unremarkable for osteomyelitisbut showed 22 x 15 mm abscess Continue vancomycin, Zosyn Continue wound care I remain concerned about Left foot-->continued edema, erythema, open wound General surgery consulted>>vascular eval Transfer to [facility] for I&D and vascular eval -Discussed with VVS, Adam Long  Lactic acidosissecondary to above Lactic acid 2.5,this may be due to dehydration, continue to trend lactic acid -sepsis ruled out -he has been afebrile and hemodynamically stable  Hypovolemic hyponatremia/Dehydration Na 132, continue IV hydration -improved with IVF  Normocytic anemia Nearbaseline hemoglobin:ranged within 12-13  Essential  hypertension Continue lisinopril and Lopressorintake  Hyperlipidemia Continue Lipitor   Hyperglycemia due to T2DM Continue SSI and hypoglycemia protocol 02/02/21 A1C--6.5  Status post mechanical aortic valve replacement Warfarin on hold for further possible further debridement -IV heparin bridge started Continue to monitor INR with goal range between 2.5-3.5 when warfarin restarted  Morbid obesity (BMI of 50.94) Patient will be counseled on lifestyle and diet modification Patient will need to follow-up with outpatient PCP and dietitian for weight loss program      Status is: Inpatient  Remains inpatient appropriate because:IV treatments appropriate due to intensity of illness or inability to take PO   Dispo: The patient is from: Home  Anticipated d/c is to: Home  Patient currently is not medically stable to d/c.              Difficult to place patient No        Family Communication:  no Family at bedside  Consultants:  Vascular surgery  Code Status:  FULL   DVT Prophylaxis:  warfarin   Procedures: As Listed in Progress Note Above  Antibiotics: vanco 5/19>> Zosyn 5/20>> Cefepime 5/19>>5/20     Subjective: Patient states swelling and erythema in left foot only slightly better.  Not having much pain.  Denies f/c, cp, sob, n/v/d, abd pain  Objective: Vitals:   02/14/21 1404 02/14/21 2005 02/15/21 0500 02/15/21 0851  BP: 108/83 107/79 120/69 133/81  Pulse: 69 79 64 74  Resp: 16 18 18    Temp: 98.3 F (36.8 C) 98 F (36.7 C) 97.9 F (36.6 C)   TempSrc:  Oral Oral   SpO2: 100% 100% 100%   Weight:      Height:  Intake/Output Summary (Last 24 hours) at 02/15/2021 1258 Last data filed at 02/15/2021 1100 Gross per 24 hour  Intake 1426.17 ml  Output --  Net 1426.17 ml   Weight change:  Exam:   General:  Pt is alert, follows commands appropriately, not in acute distress  HEENT: No icterus,  No thrush, No neck mass, Adam [facility]  Cardiovascular: RRR, S1/S2, no rubs, no gallops  Respiratory: CTA bilaterally, no wheezing, no crackles, no rhonchi  Abdomen: Soft/+BS, non tender, non distended, no guarding  Extremities: see pics of left foot        Data Reviewed: I have personally reviewed following labs and imaging studies Basic Metabolic Panel: Recent Labs  Lab 02/12/21 1455 02/13/21 0443 02/14/21 0533 02/15/21 0428  NA 132* 133* 135 134*  K 3.8 4.1 4.3 4.2  CL 98 99 100 100  CO2 25 27 28 28   GLUCOSE 135* 118* 133* 117*  BUN 11 13 13 13   CREATININE 0.69 0.75 0.74 0.71  CALCIUM 8.9 8.7* 8.9 8.6*  MG  --  1.8  --   --   PHOS  --  3.8  --   --    Liver Function Tests: Recent Labs  Lab 02/13/21 0443 02/15/21 0428  AST 19 22  ALT 20 25  ALKPHOS 53 54  BILITOT 1.4* 1.1  PROT 7.0 7.5  ALBUMIN 3.0* 3.2*   No results for input(s): LIPASE, AMYLASE in the last 168 hours. No results for input(s): AMMONIA in the last 168 hours. Coagulation Profile: Recent Labs  Lab 02/12/21 1455 02/13/21 0443 02/14/21 0533 02/15/21 0428  INR 2.9* 2.8* 1.9* 1.6*   CBC: Recent Labs  Lab 02/12/21 1455 02/13/21 0443 02/14/21 0533 02/15/21 0428  WBC 15.7* 11.4* 10.4 11.1*  NEUTROABS 12.7*  --   --   --   HGB 12.6* 12.5* 12.4* 13.0  HCT 39.0 38.8* 39.3 41.1  MCV 89.9 90.2 90.6 90.3  PLT 262 247 252 284   Cardiac Enzymes: No results for input(s): CKTOTAL, CKMB, CKMBINDEX, TROPONINI in the last 168 hours. BNP: Invalid input(s): POCBNP CBG: Recent Labs  Lab 02/14/21 1111 02/14/21 1631 02/14/21 2008 02/15/21 0739 02/15/21 1105  GLUCAP 165* 91 143* 140* 212*   HbA1C: No results for input(s): HGBA1C in the last 72 hours. Urine analysis:    Component Value Date/Time   COLORURINE STRAW (A) 12/03/2019 1133   APPEARANCEUR CLEAR 12/03/2019 1133   LABSPEC 1.006 12/03/2019 1133   PHURINE 6.0 12/03/2019 1133   GLUCOSEU NEGATIVE 12/03/2019 1133   HGBUR NEGATIVE  12/03/2019 1133   BILIRUBINUR NEGATIVE 12/03/2019 1133   KETONESUR NEGATIVE 12/03/2019 1133   PROTEINUR NEGATIVE 12/03/2019 1133   UROBILINOGEN 0.2 08/31/2011 1727   NITRITE NEGATIVE 12/03/2019 1133   LEUKOCYTESUR NEGATIVE 12/03/2019 1133   Sepsis Labs: @LABRCNTIP (procalcitonin:4,lacticidven:4) ) Recent Results (from the past 240 hour(s))  Culture, blood (routine x 2)     Status: None (Preliminary result)   Collection Time: 02/12/21  6:09 PM   Specimen: BLOOD  Result Value Ref Range Status   Specimen Description BLOOD RIGHT ANTECUBITAL  Final   Special Requests   Final    Blood Culture adequate volume BOTTLES DRAWN AEROBIC AND ANAEROBIC   Culture   Final    NO GROWTH 2 DAYS Performed at [facility], [address], [place], [facility] [address]    Report Status PENDING  Incomplete  Culture, blood (routine x 2)     Status: None (Preliminary result)   Collection Time: 02/12/21  6:09  PM   Specimen: BLOOD LEFT ARM  Result Value Ref Range Status   Specimen Description BLOOD LEFT ARM  Final   Special Requests   Final    Blood Culture adequate volume BOTTLES DRAWN AEROBIC AND ANAEROBIC   Culture   Final    NO GROWTH 2 DAYS Performed at [facility], [address] [place], [place] [address]    Report Status PENDING  Incomplete  Resp Panel by RT-PCR (Flu A&B, Covid) Nasopharyngeal Swab     Status: None   Collection Time: 02/12/21  9:00 PM   Specimen: Nasopharyngeal Swab; Nasopharyngeal(Long) swabs in vial transport medium  Result Value Ref Range Status   SARS Coronavirus 2 by RT PCR NEGATIVE NEGATIVE Final    Comment: (NOTE) SARS-CoV-2 target nucleic acids are NOT DETECTED.  The SARS-CoV-2 RNA is generally detectable in upper respiratory specimens during the acute phase of infection. The lowest concentration of SARS-CoV-2 viral copies this assay can detect is 138 copies/mL. A negative result does not preclude SARS-Cov-2 infection and should not be used as the sole basis for  treatment or other patient management decisions. A negative result may occur with  improper specimen collection/handling, submission of specimen other than nasopharyngeal swab, presence of viral mutation(s) within the areas targeted by this assay, and inadequate number of viral copies(<138 copies/mL). A negative result must be combined with clinical observations, patient history, and epidemiological information. The expected result is Negative.  Fact Sheet for Patients:  BloggerCourse.com  Fact Sheet for Healthcare Providers:  SeriousBroker.it  This test is no t yet approved or cleared by the [place] FDA and  has been authorized for detection and/or diagnosis of SARS-CoV-2 by FDA under an Emergency Use Authorization (EUA). This EUA will remain  in effect (meaning this test can be used) for the duration of the COVID-19 declaration under Section 564(b)(1) of the Act, 21 U.S.C.section 360bbb-3(b)(1), unless the authorization is terminated  or revoked sooner.       Influenza A by PCR NEGATIVE NEGATIVE Final   Influenza B by PCR NEGATIVE NEGATIVE Final    Comment: (NOTE) The Xpert Xpress SARS-CoV-2/FLU/RSV plus assay is intended as an aid in the diagnosis of influenza from Nasopharyngeal swab specimens and should not be used as a sole basis for treatment. Nasal washings and aspirates are unacceptable for Xpert Xpress SARS-CoV-2/FLU/RSV testing.  Fact Sheet for Patients: BloggerCourse.com  Fact Sheet for Healthcare Providers: SeriousBroker.it  This test is not yet approved or cleared by the [place] FDA and has been authorized for detection and/or diagnosis of SARS-CoV-2 by FDA under an Emergency Use Authorization (EUA). This EUA will remain in effect (meaning this test can be used) for the duration of the COVID-19 declaration under Section 564(b)(1) of the Act, 21  U.S.C. section 360bbb-3(b)(1), unless the authorization is terminated or revoked.  Performed at [facility], [address], [place], [place] [address]      Scheduled Meds: . atorvastatin  80 mg Oral q1800  . insulin aspart  0-9 Units Subcutaneous TID WC  . lisinopril  20 mg Oral Daily  . metoprolol tartrate  25 mg Oral BID  . sertraline  50 mg Oral Daily   Continuous Infusions: . heparin 1,600 Units/hr (02/15/21 1246)  . piperacillin-tazobactam (ZOSYN)  IV 3.375 g (02/15/21 4944)  . vancomycin 2,000 mg (02/15/21 0855)    Procedures/Studies: MR FOOT LEFT WO CONTRAST  Result Date: 02/13/2021 CLINICAL DATA:  History of prior amputations with open wound on the lateral aspect  of the forefoot. EXAM: MRI OF THE LEFT FOOT WITHOUT CONTRAST TECHNIQUE: Multiplanar, multisequence MR imaging of the left was performed. No intravenous contrast was administered. COMPARISON:  Radiographs, same date.  Prior MRI from 2020. FINDINGS: Postoperative changes related to prior amputations involving the fourth and fifth toes. There is an open wound on the plantar aspect of the forefoot laterally. There is an underlying fluid collection consistent with an abscess measuring approximately 22 x 16 mm. This abscess is right down on the osteotomy site at the proximal phalanx of the fifth toe. No definite findings for osteomyelitis. Surrounding cellulitis and myofasciitis. No findings suspicious for pyomyositis. There is a complex multi septated cystic lesion noted between the first and second metatarsals measuring approximately 4.0 x 2.5 cm. This is most likely a ganglion cyst given its appearance and appears to be emanating from the first or second MTP joints. Moderate stable appearing midfoot degenerative changes with joint space narrowing, subchondral cystic change and marrow edema. IMPRESSION: 1. Open wound on the plantar aspect of the forefoot laterally with an underlying 22 x 16 mm abscess. 2. No definite findings  for osteomyelitis. 3. Surrounding cellulitis and myofasciitis. 4. 4.0 x 2.5 cm complex multi septated cystic lesion between the first and second metatarsals. This is most likely a ganglion cyst. 5. Moderate stable appearing midfoot degenerative changes. Electronically Signed   By: Rudie Meyer M.D.   On: 02/13/2021 08:05   DG Foot Complete Left  Result Date: 02/12/2021 CLINICAL DATA:  Foot ulcer EXAM: LEFT FOOT - COMPLETE 3+ VIEW COMPARISON:  June 11, 2019 FINDINGS: Frontal, oblique, and lateral views obtained. There is been amputation at the level of the proximal fourth and fifth proximal phalanges. There is soft tissue swelling dorsally. No acute fracture or dislocation. Remaining joint spaces appear normal. No erosive change or bony destruction. No soft tissue air or radiopaque foreign body. IMPRESSION: Soft tissue swelling. No soft tissue air or radiopaque foreign body. Status post amputations at the proximal fourth and fifth proximal phalangeal levels. Remaining bony structures appear intact. No bony destruction or erosion. No fracture or dislocation. Remaining joint spaces normal. Electronically Signed   By: Bretta Bang III M.D.   On: 02/12/2021 14:38    Catarina Hartshorn, DO  [facility]  If 7PM-7AM, please contact night-coverage [email or web address] Password [facility] 02/15/2021, 12:58 PM   LOS: 3 days

## 2021-02-15 NOTE — Progress Notes (Signed)
  Subjective: Patient ambulating in room.  Already had dressing changed today.  Objective: Vital signs in last 24 hours: Temp:  [97.9 F (36.6 C)-98.3 F (36.8 C)] 97.9 F (36.6 C) (05/22 0500) Pulse Rate:  [64-79] 64 (05/22 0500) Resp:  [16-18] 18 (05/22 0500) BP: (107-120)/(69-83) 120/69 (05/22 0500) SpO2:  [100 %] 100 % (05/22 0500) Last BM Date: 02/14/21  Intake/Output from previous day: 05/21 0701 - 05/22 0700 In: 1306.2 [P.O.:840; IV Piggyback:466.2] Out: -  Intake/Output this shift: No intake/output data recorded.  General appearance: alert, cooperative and no distress Left foot with some erythema and swelling present.  Wound covered. Lab Results:  Recent Labs    02/14/21 0533 02/15/21 0428  WBC 10.4 11.1*  HGB 12.4* 13.0  HCT 39.3 41.1  PLT 252 284   BMET Recent Labs    02/14/21 0533 02/15/21 0428  NA 135 134*  K 4.3 4.2  CL 100 100  CO2 28 28  GLUCOSE 133* 117*  BUN 13 13  CREATININE 0.74 0.71  CALCIUM 8.9 8.6*   PT/INR Recent Labs    02/14/21 0533 02/15/21 0428  LABPROT 21.5* 18.7*  INR 1.9* 1.6*    Studies/Results: No results found.  Anti-infectives: Anti-infectives (From admission, onward)   Start     Dose/Rate Route Frequency Ordered Stop   02/13/21 0700  vancomycin (VANCOREADY) IVPB 2000 mg/400 mL        2,000 mg 200 mL/hr over 120 Minutes Intravenous Every 12 hours 02/12/21 1730     02/13/21 0600  piperacillin-tazobactam (ZOSYN) IVPB 3.375 g  Status:  Discontinued        3.375 g 100 mL/hr over 30 Minutes Intravenous Every 6 hours 02/13/21 0448 02/13/21 0455   02/13/21 0600  piperacillin-tazobactam (ZOSYN) IVPB 3.375 g        3.375 g 12.5 mL/hr over 240 Minutes Intravenous Every 8 hours 02/13/21 0455     02/12/21 1745  vancomycin (VANCOCIN) 2,500 mg in sodium chloride 0.9 % 500 mL IVPB  Status:  Discontinued        2,500 mg 250 mL/hr over 120 Minutes Intravenous  Once 02/12/21 1730 02/12/21 1733   02/12/21 1745  vancomycin  (VANCOREADY) IVPB 2000 mg/400 mL       "And" Linked Group Details   2,000 mg 200 mL/hr over 120 Minutes Intravenous  Once 02/12/21 1733 02/12/21 2228   02/12/21 1745  vancomycin (VANCOREADY) IVPB 500 mg/100 mL       "And" Linked Group Details   500 mg 100 mL/hr over 60 Minutes Intravenous  Once 02/12/21 1733 02/12/21 2205   02/12/21 1715  ceFEPIme (MAXIPIME) 2 g in sodium chloride 0.9 % 100 mL IVPB        2 g 200 mL/hr over 30 Minutes Intravenous  Once 02/12/21 1711 02/12/21 2029      Assessment/Plan: Impression: Diabetic left foot ulceration, local debridement and drainage of abscess yesterday. Plan: Okay for discharge from surgery standpoint.  I will follow-up with the patient in my office.  Would recommend a 10-day course of oral antibiotics.  Patient will not be working during that time.  He was instructed to keep his foot elevated when able.  Continue local wound care.  LOS: 3 days    Franky Macho 02/15/2021

## 2021-02-15 NOTE — Consult Note (Addendum)
REASON FOR CONSULT:    Diabetic foot infection.  The consult is requested by the Hospitalist.  ASSESSMENT & PLAN:   DIABETIC FOOT INFECTION LEFT FOOT: This patient has a wound on the plantar aspect of his left foot.  MRI does not show any evidence of osteomyelitis.  I was able to pack this area and therefore I do not think he needs I&D in the operating room.  He is on intravenous Zosyn.  I have written for dressing changes.  He has biphasic Doppler signals in both feet and had a normal arteriogram 18 months ago.  I have ordered an arterial Doppler study.  If this confirms good perfusion on the left and I do not think we need to proceed with arteriography.  He would be at slightly increased risk for arteriography given his obesity (BMI 50).  Once the cellulitis has improved he could be discharged on po antibiotics and could be followed by Dr. Gretta Began in [place].  The patient actually states that he is a neighbor of Dr. Arbie Cookey who has followed him for some time.  ANTICOAGULATION: His Coumadin has been held in case he needs an intervention.  His INR today is 1.6.  He is on intravenous heparin.  If his noninvasive studies confirm that he has good flow to the left foot than from my standpoint he could be restarted on his Coumadin.  Waverly Ferrari, MD Office: [phone]   HPI:   Adam Long is a pleasant 45 y.o. male, who has been followed by Dr. Tawanna Cooler Early in the past with wounds on the left foot.  He underwent a left fourth and fifth toe amputation on 07/09/2019 which ultimately healed.  He was seen in follow-up last on 09/04/2019 and he had a palpable dorsalis pedis pulse.  Of note he had undergone an arteriogram in October 2020 which showed no significant arterial occlusive disease.  A week ago he developed a wound on the lateral aspect of his left foot.  He was admitted in [facility] for intravenous antibiotics.  The physician there requested transfer here is the general  surgeons at [facility] preferred not to address the diabetic foot infection given that he had previously been seen by vascular surgery.  On my history, he denies any history of claudication.  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  He is not sure how he developed the wound.  He does have some neuropathy in both feet related to his diabetes.  He denies any history of rest pain.  Past Medical History:  Diagnosis Date  . Anxiety   . Aortic stenosis   . Cellulitis and abscess of lower extremity 06/11/2019  . Cellulitis of fourth toe of left foot   . Cholelithiasis   . Coronary artery disease    Nonobstructive CAD (40-50% LAD) 08/2019  . Depression   . Depression    Phreesia 09/27/2020  . Depression    Phreesia 11/01/2020  . Diabetes mellitus without complication (HCC)    Phreesia 09/27/2020  . Elevated troponin level not due myocardial infarction 11/11/2019  . Essential hypertension   . Gangrene of toe of left foot (HCC) 07/06/2019  . Heart murmur    Phreesia 09/27/2020  . Hyperlipidemia    Phreesia 09/27/2020  . Hypertension    Phreesia 09/27/2020  . Mixed hyperlipidemia   . Morbid obesity (HCC)   . S/P aortic valve replacement with mechanical valve 12/05/2019  25 mm Carbomedics top hat bileaflet mechanical valve via partial upper hemi-sternotomy  . Severe aortic stenosis 09/24/2019  . Type 2 diabetes mellitus (HCC)     Family History  Problem Relation Age of Onset  . Cancer Mother        Brain  . Heart disease Father   . Hyperlipidemia Father   . Hypertension Father   . Stroke Father   . Heart murmur Sister     SOCIAL HISTORY: Social History   Socioeconomic History  . Marital status: Single    Spouse name: Not on file  . Number of children: Not on file  . Years of education: Not on file  . Highest education level: Not on file  Occupational History    Comment: Glass blower/designer- self-employed  Tobacco Use  . Smoking status: Never  Smoker  . Smokeless tobacco: Former Neurosurgeon    Types: Engineer, drilling  . Vaping Use: Never used  Substance and Sexual Activity  . Alcohol use: Not Currently    Comment: occasionally  . Drug use: No  . Sexual activity: Yes    Birth control/protection: None  Other Topics Concern  . Not on file  Social History Narrative  . Not on file   Social Determinants of Health   Financial Resource Strain: Not on file  Food Insecurity: Not on file  Transportation Needs: Not on file  Physical Activity: Not on file  Stress: Not on file  Social Connections: Not on file  Intimate Partner Violence: Not on file    Allergies  Allergen Reactions  . Pollen Extract Other (See Comments)    Runny nose, watery eyes, sneezing    Current Facility-Administered Medications  Medication Dose Route Frequency Provider Last Rate Last Admin  . atorvastatin (LIPITOR) tablet 80 mg  80 mg Oral q1800 Tat, Onalee Hua, MD   80 mg at 02/14/21 1744  . heparin ADULT infusion 100 units/mL (25000 units/271mL)  1,600 Units/hr Intravenous Continuous Valrie Hart A, RPH 16 mL/hr at 02/15/21 1246 1,600 Units/hr at 02/15/21 1246  . insulin aspart (novoLOG) injection 0-9 Units  0-9 Units Subcutaneous TID WC Catarina Hartshorn, MD   3 Units at 02/15/21 1236  . lisinopril (ZESTRIL) tablet 20 mg  20 mg Oral Daily Tat, David, MD   20 mg at 02/15/21 0851  . metoprolol tartrate (LOPRESSOR) tablet 25 mg  25 mg Oral BID Catarina Hartshorn, MD   25 mg at 02/15/21 0851  . piperacillin-tazobactam (ZOSYN) IVPB 3.375 g  3.375 g Intravenous Andres Labrum, MD 12.5 mL/hr at 02/15/21 1318 3.375 g at 02/15/21 1318  . sertraline (ZOLOFT) tablet 50 mg  50 mg Oral Daily Tat, David, MD   50 mg at 02/15/21 0851  . vancomycin (VANCOREADY) IVPB 2000 mg/400 mL  2,000 mg Intravenous Pablo Ledger, MD 200 mL/hr at 02/15/21 0855 2,000 mg at 02/15/21 0855    REVIEW OF SYSTEMS:  [X]  denotes positive finding, [ ]  denotes negative finding Cardiac  Comments:  Chest pain or  chest pressure:    Shortness of breath upon exertion:    Short of breath when lying flat:    Irregular heart rhythm:        Vascular    Pain in calf, thigh, or hip brought on by ambulation:    Pain in feet at night that wakes you up from your sleep:     Blood clot in your veins:    Leg swelling:  Pulmonary    Oxygen at home:    Productive cough:     Wheezing:         Neurologic    Sudden weakness in arms or legs:     Sudden numbness in arms or legs:     Sudden onset of difficulty speaking or slurred speech:    Temporary loss of vision in one eye:     Problems with dizziness:         Gastrointestinal    Blood in stool:     Vomited blood:         Genitourinary    Burning when urinating:     Blood in urine:        Psychiatric    Major depression:         Hematologic    Bleeding problems:    Problems with blood clotting too easily:        Skin    Rashes or ulcers: x       Constitutional    Fever or chills:     PHYSICAL EXAM:   Vitals:   02/14/21 2005 02/15/21 0500 02/15/21 0851 02/15/21 1500  BP: 107/79 120/69 133/81 108/63  Pulse: 79 64 74 72  Resp: 18 18  18   Temp: 98 F (36.7 C) 97.9 F (36.6 C)  98.1 F (36.7 C)  TempSrc: Oral Oral  Oral  SpO2: 100% 100%  100%  Weight:      Height:       Body mass index is 50.94 kg/m.  GENERAL: The patient is a well-nourished male, in no acute distress. The vital signs are documented above. CARDIAC: There is a regular rate and rhythm.  VASCULAR: I do not detect carotid bruits. Because of his obesity I could not palpate femoral pulses. On the right side he has a palpable dorsalis pedis pulse.  He has a biphasic dorsalis pedis and posterior tibial signal with a Doppler on the right. Because of moderate foot swelling on the left I could not palpate pedal pulses.  However he has a biphasic dorsalis pedis and posterior tibial signal on the left. PULMONARY: There is good air exchange bilaterally without wheezing  or rales. ABDOMEN: Soft and non-tender with normal pitched bowel sounds.  MUSCULOSKELETAL: There are no major deformities or cyanosis. NEUROLOGIC: No focal weakness or paresthesias are detected. SKIN: He has a wound on the plantar aspect of the left foot as documented in the photograph.     I was able to connect the open area with the swollen area laterally with a Q-tip and this area was packed.  Thus I do not think that he needs surgical intervention in the operating room. PSYCHIATRIC: The patient has a normal affect.  DATA:    X-ray of the left foot did not show any evidence of fracture or osteo-.  MRI of the left foot in [facility] showed a 2 cm x 1-1/2 cm abscess with no evidence of osteo-.  LABS: I reviewed his labs from [facility].  His GFR is greater than 60.  Creatinine 0.74.  Hemoglobin A1c 6.5.  His INR today is 1.6.

## 2021-02-15 NOTE — Progress Notes (Signed)
Patient being transferred to [facility]. Melburn Hake has been called to set up Transportation  Report called to receiving nurse at [facility]. Patient updated on transfer.

## 2021-02-15 NOTE — Progress Notes (Signed)
PROGRESS NOTE  Adam Long YBO:[id number] DOB: 1976-07-10 DOA: 02/12/2021 PCP: Heather Roberts, NP  Brief History:  45 y.o.malewith medical history significant forT2DM, prior aortic valve replacement, CAD, hypertension, hyperlipidemia and depression who presents to the emergency department due to some soreness to the lateral part of left foot x 1 week.  He noted what he felt to be a blister on the lateral aspect of his left foot 1 week prior to admission.  He noted increase swelling, erythema and malodorous drainage over the next 2-3 days.  He went to urgent care whom directed him to go to the ED.  wound, but realized that it was a wound yesterday due to foul-smelling odor from the wound. Left foot x-ray showedsoft tissue swelling. No soft tissue air or radiopaque foreign body on x-ray.  Placed on vancomycin and zosyn.  WBC gradually trending down but left foot continued to have drainage, edema and erythema, not much improved.  MRI was neg for osteomyelitis but showed 22x16 mm abscess.  General surgery was consulted and felt that patient may be better served being evaluated by vascular surgery.  Patient had amputation of left 4th and left 5 th toes in Oct 2020.  Assessment/Plan:  Diabetic Foot Infection/abscess, does not meet sepsis criteria MRI unremarkable for osteomyelitis but showed 22 x 15 mm abscess Continue vancomycin, Zosyn - deescalate pending cultures. Continue wound care Continue PT/OT eval and treat Vascular following, appreciate insight and recommendations; successful packing on 5/22; ABI pending -ultimate disposition likely outpatient follow-up with Dr. Arbie Cookey in [place] on p.o. antibiotics pending cultures and sensitivities  Lactic acidosis secondary to above Lactic acid 2.5,this may be due to dehydration, continue to trend lactic acid -sepsis ruled out -he has been afebrile and hemodynamically stable  Status post mechanical aortic valve replacement On  chronic anticoagulation Given no need for intervention or procedure will transition back to p.o. Coumadin with Lovenox bridge Monitor INR levels per pharmacy  Hypovolemic hyponatremia/Dehydration Na 132, continue IV hydration -improved with IVF  Normocytic anemia Nearbaseline hemoglobin: ranged within 10-14  Essential hypertension Continue lisinopril and Lopressor when patient resumes oral intake  Hyperlipidemia Continue Lipitor when patient resumes oral intake  Hyperglycemia due to T2DM Continue SSI and hypoglycemia protocol 02/02/21 A1C--6.5   Morbid obesity (BMI of 50.94) Patient will be counseled on lifestyle and diet modification Patient will need to follow-up with outpatient PCP and dietitian for weight loss program  Status is: Inpatient  Remains inpatient appropriate because:Ongoing diagnostic testing needed not appropriate for outpatient work up and IV treatments appropriate due to intensity of illness or inability to take PO   Dispo: The patient is from: Home              Anticipated d/c is to: Home              Patient currently is not medically stable to d/c.   Difficult to place patient No   Family Communication:  no Family at bedside Consultants:  Vascular surgery Code Status:  FULL  DVT Prophylaxis: Coumadin with Lovenox bridge   Procedures: As Listed in Progress Note Above  Antibiotics: Vanco, Zosyn -ongoing  Subjective: No acute issues or events overnight denies nausea vomiting diarrhea constipation headache fevers chills chest pain shortness of breath.   Objective: Vitals:   02/14/21 0821 02/14/21 1404 02/14/21 2005 02/15/21 0500  BP: 103/72 108/83 107/79 120/69  Pulse: 75 69 79 64  Resp:  16  18 18  Temp:  98.3 F (36.8 C) 98 F (36.7 C) 97.9 F (36.6 C)  TempSrc:   Oral Oral  SpO2:  100% 100% 100%  Weight:      Height:        Intake/Output Summary (Last 24 hours) at 02/15/2021 4008 Last data filed at 02/14/2021 1854 Gross per  24 hour  Intake 1306.17 ml  Output --  Net 1306.17 ml   Weight change:  Exam:   General:  Pt is alert, follows commands appropriately, not in acute distress  HEENT: No icterus, No thrush, No neck mass, [place]/AT  Cardiovascular: RRR, S1/S2, no rubs, no gallops  Respiratory: CTA bilaterally, no wheezing, no crackles, no rhonchi  Abdomen: Soft/+BS, non tender, non distended, no guarding  Extremities: see pics        Data Reviewed: I have personally reviewed following labs and imaging studies Basic Metabolic Panel: Recent Labs  Lab 02/12/21 1455 02/13/21 0443 02/14/21 0533 02/15/21 0428  NA 132* 133* 135 134*  K 3.8 4.1 4.3 4.2  CL 98 99 100 100  CO2 25 27 28 28   GLUCOSE 135* 118* 133* 117*  BUN 11 13 13 13   CREATININE 0.69 0.75 0.74 0.71  CALCIUM 8.9 8.7* 8.9 8.6*  MG  --  1.8  --   --   PHOS  --  3.8  --   --    Liver Function Tests: Recent Labs  Lab 02/13/21 0443 02/15/21 0428  AST 19 22  ALT 20 25  ALKPHOS 53 54  BILITOT 1.4* 1.1  PROT 7.0 7.5  ALBUMIN 3.0* 3.2*   No results for input(s): LIPASE, AMYLASE in the last 168 hours. No results for input(s): AMMONIA in the last 168 hours. Coagulation Profile: Recent Labs  Lab 02/12/21 1455 02/13/21 0443 02/14/21 0533 02/15/21 0428  INR 2.9* 2.8* 1.9* 1.6*   CBC: Recent Labs  Lab 02/12/21 1455 02/13/21 0443 02/14/21 0533 02/15/21 0428  WBC 15.7* 11.4* 10.4 11.1*  NEUTROABS 12.7*  --   --   --   HGB 12.6* 12.5* 12.4* 13.0  HCT 39.0 38.8* 39.3 41.1  MCV 89.9 90.2 90.6 90.3  PLT 262 247 252 284   Cardiac Enzymes: No results for input(s): CKTOTAL, CKMB, CKMBINDEX, TROPONINI in the last 168 hours. BNP: Invalid input(s): POCBNP CBG: Recent Labs  Lab 02/14/21 0736 02/14/21 1111 02/14/21 1631 02/14/21 2008 02/15/21 0739  GLUCAP 120* 165* 91 143* 140*   HbA1C: No results for input(s): HGBA1C in the last 72 hours. Urine analysis:    Component Value Date/Time   COLORURINE STRAW (A)  12/03/2019 1133   APPEARANCEUR CLEAR 12/03/2019 1133   LABSPEC 1.006 12/03/2019 1133   PHURINE 6.0 12/03/2019 1133   GLUCOSEU NEGATIVE 12/03/2019 1133   HGBUR NEGATIVE 12/03/2019 1133   BILIRUBINUR NEGATIVE 12/03/2019 1133   KETONESUR NEGATIVE 12/03/2019 1133   PROTEINUR NEGATIVE 12/03/2019 1133   UROBILINOGEN 0.2 08/31/2011 1727   NITRITE NEGATIVE 12/03/2019 1133   LEUKOCYTESUR NEGATIVE 12/03/2019 1133   Sepsis Labs: @LABRCNTIP (procalcitonin:4,lacticidven:4) ) Recent Results (from the past 240 hour(s))  Culture, blood (routine x 2)     Status: None (Preliminary result)   Collection Time: 02/12/21  6:09 PM   Specimen: BLOOD  Result Value Ref Range Status   Specimen Description BLOOD RIGHT ANTECUBITAL  Final   Special Requests   Final    Blood Culture adequate volume BOTTLES DRAWN AEROBIC AND ANAEROBIC   Culture   Final    NO GROWTH 2  DAYS Performed at [facility], [address], [place], [place] [address]    Report Status PENDING  Incomplete  Culture, blood (routine x 2)     Status: None (Preliminary result)   Collection Time: 02/12/21  6:09 PM   Specimen: BLOOD LEFT ARM  Result Value Ref Range Status   Specimen Description BLOOD LEFT ARM  Final   Special Requests   Final    Blood Culture adequate volume BOTTLES DRAWN AEROBIC AND ANAEROBIC   Culture   Final    NO GROWTH 2 DAYS Performed at [facility], [address], [place], [place] [address]    Report Status PENDING  Incomplete  Resp Panel by RT-PCR (Flu A&B, Covid) Nasopharyngeal Swab     Status: None   Collection Time: 02/12/21  9:00 PM   Specimen: Nasopharyngeal Swab; Nasopharyngeal(NP) swabs in vial transport medium  Result Value Ref Range Status   SARS Coronavirus 2 by RT PCR NEGATIVE NEGATIVE Final    Comment: (NOTE) SARS-CoV-2 target nucleic acids are NOT DETECTED.  The SARS-CoV-2 RNA is generally detectable in upper respiratory specimens during the acute phase of infection. The lowest concentration of  SARS-CoV-2 viral copies this assay can detect is 138 copies/mL. A negative result does not preclude SARS-Cov-2 infection and should not be used as the sole basis for treatment or other patient management decisions. A negative result may occur with  improper specimen collection/handling, submission of specimen other than nasopharyngeal swab, presence of viral mutation(s) within the areas targeted by this assay, and inadequate number of viral copies(<138 copies/mL). A negative result must be combined with clinical observations, patient history, and epidemiological information. The expected result is Negative.  Fact Sheet for Patients:  BloggerCourse.com  Fact Sheet for Healthcare Providers:  SeriousBroker.it  This test is no t yet approved or cleared by the [place] FDA and  has been authorized for detection and/or diagnosis of SARS-CoV-2 by FDA under an Emergency Use Authorization (EUA). This EUA will remain  in effect (meaning this test can be used) for the duration of the COVID-19 declaration under Section 564(b)(1) of the Act, 21 U.S.C.section 360bbb-3(b)(1), unless the authorization is terminated  or revoked sooner.       Influenza A by PCR NEGATIVE NEGATIVE Final   Influenza B by PCR NEGATIVE NEGATIVE Final    Comment: (NOTE) The Xpert Xpress SARS-CoV-2/FLU/RSV plus assay is intended as an aid in the diagnosis of influenza from Nasopharyngeal swab specimens and should not be used as a sole basis for treatment. Nasal washings and aspirates are unacceptable for Xpert Xpress SARS-CoV-2/FLU/RSV testing.  Fact Sheet for Patients: BloggerCourse.com  Fact Sheet for Healthcare Providers: SeriousBroker.it  This test is not yet approved or cleared by the [place] FDA and has been authorized for detection and/or diagnosis of SARS-CoV-2 by FDA under an Emergency Use  Authorization (EUA). This EUA will remain in effect (meaning this test can be used) for the duration of the COVID-19 declaration under Section 564(b)(1) of the Act, 21 U.S.C. section 360bbb-3(b)(1), unless the authorization is terminated or revoked.  Performed at [facility], [address], [place], [place] [address]      Scheduled Meds: . atorvastatin  80 mg Oral q1800  . insulin aspart  0-9 Units Subcutaneous TID WC  . lisinopril  20 mg Oral Daily  . metoprolol tartrate  25 mg Oral BID  . sertraline  50 mg Oral Daily   Continuous Infusions: . piperacillin-tazobactam (ZOSYN)  IV 3.375 g (02/15/21 3220)  .  vancomycin 2,000 mg (02/14/21 2143)    Procedures/Studies: MR FOOT LEFT WO CONTRAST  Result Date: 02/13/2021 CLINICAL DATA:  History of prior amputations with open wound on the lateral aspect of the forefoot. EXAM: MRI OF THE LEFT FOOT WITHOUT CONTRAST TECHNIQUE: Multiplanar, multisequence MR imaging of the left was performed. No intravenous contrast was administered. COMPARISON:  Radiographs, same date.  Prior MRI from 2020. FINDINGS: Postoperative changes related to prior amputations involving the fourth and fifth toes. There is an open wound on the plantar aspect of the forefoot laterally. There is an underlying fluid collection consistent with an abscess measuring approximately 22 x 16 mm. This abscess is right down on the osteotomy site at the proximal phalanx of the fifth toe. No definite findings for osteomyelitis. Surrounding cellulitis and myofasciitis. No findings suspicious for pyomyositis. There is a complex multi septated cystic lesion noted between the first and second metatarsals measuring approximately 4.0 x 2.5 cm. This is most likely a ganglion cyst given its appearance and appears to be emanating from the first or second MTP joints. Moderate stable appearing midfoot degenerative changes with joint space narrowing, subchondral cystic change and marrow edema. IMPRESSION:  1. Open wound on the plantar aspect of the forefoot laterally with an underlying 22 x 16 mm abscess. 2. No definite findings for osteomyelitis. 3. Surrounding cellulitis and myofasciitis. 4. 4.0 x 2.5 cm complex multi septated cystic lesion between the first and second metatarsals. This is most likely a ganglion cyst. 5. Moderate stable appearing midfoot degenerative changes. Electronically Signed   By: Rudie MeyerP.  Gallerani M.D.   On: 02/13/2021 08:05   DG Foot Complete Left  Result Date: 02/12/2021 CLINICAL DATA:  Foot ulcer EXAM: LEFT FOOT - COMPLETE 3+ VIEW COMPARISON:  June 11, 2019 FINDINGS: Frontal, oblique, and lateral views obtained. There is been amputation at the level of the proximal fourth and fifth proximal phalanges. There is soft tissue swelling dorsally. No acute fracture or dislocation. Remaining joint spaces appear normal. No erosive change or bony destruction. No soft tissue air or radiopaque foreign body. IMPRESSION: Soft tissue swelling. No soft tissue air or radiopaque foreign body. Status post amputations at the proximal fourth and fifth proximal phalangeal levels. Remaining bony structures appear intact. No bony destruction or erosion. No fracture or dislocation. Remaining joint spaces normal. Electronically Signed   By: Bretta BangWilliam  Woodruff III M.D.   On: 02/12/2021 14:38    Azucena FallenWilliam C Zayla Agar, DO  [facility]  If 7PM-7AM, please contact night-coverage [email or web address]  02/15/2021, 8:23 AM   LOS: 3 days

## 2021-02-15 NOTE — Progress Notes (Addendum)
ANTICOAGULATION CONSULT NOTE - Initial Consult  Pharmacy Consult for Heparin Indication: anticoagulation for aortic valve  Allergies  Allergen Reactions  . Pollen Extract Other (See Comments)    Runny nose, watery eyes, sneezing   Patient Measurements: Height: 5\' 10"  (177.8 cm) Weight: (!) 161 kg (355 lb) IBW/kg (Calculated) : 73 HEPARIN DW (KG): 112.2  Vital Signs: Temp: 98.1 F (36.7 C) (05/22 1500) Temp Source: Oral (05/22 1500) BP: 108/63 (05/22 1500) Pulse Rate: 72 (05/22 1500)  Labs: Recent Labs    02/13/21 0443 02/14/21 0533 02/15/21 0428 02/15/21 1909  HGB 12.5* 12.4* 13.0  --   HCT 38.8* 39.3 41.1  --   PLT 247 252 284  --   APTT 86*  --   --   --   LABPROT 29.8* 21.5* 18.7*  --   INR 2.8* 1.9* 1.6*  --   HEPARINUNFRC  --   --   --  <0.10*  CREATININE 0.75 0.74 0.71  --    Estimated Creatinine Clearance: 180.3 mL/min (by C-G formula based on SCr of 0.71 mg/dL).  Medical History: Past Medical History:  Diagnosis Date  . Anxiety   . Aortic stenosis   . Cellulitis and abscess of lower extremity 06/11/2019  . Cellulitis of fourth toe of left foot   . Cholelithiasis   . Coronary artery disease    Nonobstructive CAD (40-50% LAD) 08/2019  . Depression   . Depression    Phreesia 09/27/2020  . Depression    Phreesia 11/01/2020  . Diabetes mellitus without complication (HCC)    Phreesia 09/27/2020  . Elevated troponin level not due myocardial infarction 11/11/2019  . Essential hypertension   . Gangrene of toe of left foot (HCC) 07/06/2019  . Heart murmur    Phreesia 09/27/2020  . Hyperlipidemia    Phreesia 09/27/2020  . Hypertension    Phreesia 09/27/2020  . Mixed hyperlipidemia   . Morbid obesity (HCC)   . S/P aortic valve replacement with mechanical valve 12/05/2019   25 mm Carbomedics top hat bileaflet mechanical valve via partial upper hemi-sternotomy  . Severe aortic stenosis 09/24/2019  . Type 2 diabetes mellitus (HCC)     Medications:   Medications Prior to Admission  Medication Sig Dispense Refill Last Dose  . atorvastatin (LIPITOR) 80 MG tablet Take 1 tablet (80 mg total) by mouth daily. 90 tablet 3 02/12/2021 at Unknown time  . insulin glargine (LANTUS SOLOSTAR) 100 UNIT/ML Solostar Pen Inject 30 Units into the skin at bedtime. 30 mL 3 02/11/2021 at Unknown time  . lisinopril (ZESTRIL) 20 MG tablet TAKE 1 Tablet BY MOUTH ONCE EVERY DAY 90 tablet 2 02/12/2021 at Unknown time  . metoprolol tartrate (LOPRESSOR) 25 MG tablet Take 1 tablet (25 mg total) by mouth 2 (two) times daily. 180 tablet 1 02/12/2021 at 01130  . sertraline (ZOLOFT) 50 MG tablet Take 1 tablet by mouth once daily 30 tablet 0 02/12/2021 at Unknown time  . sitaGLIPtin-metformin (JANUMET) 50-500 MG tablet Take 1 tablet by mouth 2 (two) times daily with a meal. 180 tablet 3 02/12/2021 at Unknown time  . warfarin (COUMADIN) 10 MG tablet Take 1 tablet daily except 1 1/2 tablets on Mondays, Wednesdays and Fridays or as directed 45 tablet 6 02/11/2021 at 1900    Assessment: 45 yo male with diabetic foot infection/abscess and on heparin. He is on warfarin PTA for a mechanical AVR.  Plans for possible surgical procedure (plans for  debridement and drainage of abscess) -heparin level <  0.1, Hg= 12, INR= 1.6   Goal of Therapy:  Heparin level 0.3-0.7 units/ml Monitor platelets by anticoagulation protocol: Yes   Plan:  -Heparin bolus 2000 units then increase to 2000 units/hr -Heparin level in 6 hours and daily wth CBC daily  Harland German, PharmD Clinical Pharmacist **Pharmacist phone directory can now be found on [email or web address] ([email or web address]).  Listed under [facility].

## 2021-02-15 NOTE — Progress Notes (Signed)
Patient left [place] [facility] via [facility] to be transported to [facility].

## 2021-02-15 NOTE — Progress Notes (Signed)
Pharmacy Antibiotic Note  Adam Long is a 45 y.o. male admitted on 02/12/2021 with wound infection of L foot. Pharmacy has been consulted for vancomycin dosing. Lactic up, Cr <1 at baseline  Plan: Vancomycin 2500mg  x1 then 2000mg  IV q12h - est AUC 471 Follow Cr, C, LOT Vancomycin levels as needed  Height: 5\' 10"  (177.8 cm) Weight: (!) 161 kg (355 lb) IBW/kg (Calculated) : 73  Temp (24hrs), Avg:98.1 F (36.7 C), Min:97.9 F (36.6 C), Max:98.3 F (36.8 C)  Recent Labs  Lab 02/12/21 1455 02/13/21 0443 02/13/21 0711 02/14/21 0533 02/15/21 0428  WBC 15.7* 11.4*  --  10.4 11.1*  CREATININE 0.69 0.75  --  0.74 0.71  LATICACIDVEN 2.5* 1.2 0.9  --   --     Estimated Creatinine Clearance: 180.3 mL/min (by C-G formula based on SCr of 0.71 mg/dL).    Allergies  Allergen Reactions  . Pollen Extract Other (See Comments)    Runny nose, watery eyes, sneezing    02/15/21, PharmD Clinical Pharmacist 02/15/2021

## 2021-02-15 NOTE — Plan of Care (Signed)
  Problem: Clinical Measurements: Goal: Will remain free from infection Outcome: Adequate for Discharge   

## 2021-02-15 NOTE — Progress Notes (Signed)
ANTICOAGULATION CONSULT NOTE - Initial Consult  Pharmacy Consult for Heparin Indication: anticoagulation for aortic valve  Allergies  Allergen Reactions  . Pollen Extract Other (See Comments)    Runny nose, watery eyes, sneezing   Patient Measurements: Height: 5\' 10"  (177.8 cm) Weight: (!) 161 kg (355 lb) IBW/kg (Calculated) : 73 HEPARIN DW (KG): 112.2  Vital Signs: Temp: 97.9 F (36.6 C) (05/22 0500) Temp Source: Oral (05/22 0500) BP: 133/81 (05/22 0851) Pulse Rate: 74 (05/22 0851)  Labs: Recent Labs    02/13/21 0443 02/14/21 0533 02/15/21 0428  HGB 12.5* 12.4* 13.0  HCT 38.8* 39.3 41.1  PLT 247 252 284  APTT 86*  --   --   LABPROT 29.8* 21.5* 18.7*  INR 2.8* 1.9* 1.6*  CREATININE 0.75 0.74 0.71   Estimated Creatinine Clearance: 180.3 mL/min (by C-G formula based on SCr of 0.71 mg/dL).  Medical History: Past Medical History:  Diagnosis Date  . Anxiety   . Aortic stenosis   . Cellulitis and abscess of lower extremity 06/11/2019  . Cellulitis of fourth toe of left foot   . Cholelithiasis   . Coronary artery disease    Nonobstructive CAD (40-50% LAD) 08/2019  . Depression   . Depression    Phreesia 09/27/2020  . Depression    Phreesia 11/01/2020  . Diabetes mellitus without complication (HCC)    Phreesia 09/27/2020  . Elevated troponin level not due myocardial infarction 11/11/2019  . Essential hypertension   . Gangrene of toe of left foot (HCC) 07/06/2019  . Heart murmur    Phreesia 09/27/2020  . Hyperlipidemia    Phreesia 09/27/2020  . Hypertension    Phreesia 09/27/2020  . Mixed hyperlipidemia   . Morbid obesity (HCC)   . S/P aortic valve replacement with mechanical valve 12/05/2019   25 mm Carbomedics top hat bileaflet mechanical valve via partial upper hemi-sternotomy  . Severe aortic stenosis 09/24/2019  . Type 2 diabetes mellitus (HCC)     Medications:  Medications Prior to Admission  Medication Sig Dispense Refill Last Dose  . atorvastatin  (LIPITOR) 80 MG tablet Take 1 tablet (80 mg total) by mouth daily. 90 tablet 3 02/12/2021 at Unknown time  . insulin glargine (LANTUS SOLOSTAR) 100 UNIT/ML Solostar Pen Inject 30 Units into the skin at bedtime. 30 mL 3 02/11/2021 at Unknown time  . lisinopril (ZESTRIL) 20 MG tablet TAKE 1 Tablet BY MOUTH ONCE EVERY DAY 90 tablet 2 02/12/2021 at Unknown time  . metoprolol tartrate (LOPRESSOR) 25 MG tablet Take 1 tablet (25 mg total) by mouth 2 (two) times daily. 180 tablet 1 02/12/2021 at 01130  . sertraline (ZOLOFT) 50 MG tablet Take 1 tablet by mouth once daily 30 tablet 0 02/12/2021 at Unknown time  . sitaGLIPtin-metformin (JANUMET) 50-500 MG tablet Take 1 tablet by mouth 2 (two) times daily with a meal. 180 tablet 3 02/12/2021 at Unknown time  . warfarin (COUMADIN) 10 MG tablet Take 1 tablet daily except 1 1/2 tablets on Mondays, Wednesdays and Fridays or as directed 45 tablet 6 02/11/2021 at 1900    Assessment: Pt on Warfarin.  Currently INR 1.6.  Heparin protocol ordered to provide [facility] until INR at goal.  Goal of Therapy:  Heparin level 0.3-0.7 units/ml Monitor platelets by anticoagulation protocol: Yes   Plan:  Heparin bolus 2000 units x 1 then infusion at 1600 units/hr Check HL in 6 hours Monitor CBC, s/sx bleeding complications  [facility] 02/15/2021,12:16 PM

## 2021-02-16 ENCOUNTER — Inpatient Hospital Stay (HOSPITAL_COMMUNITY): Payer: 59

## 2021-02-16 DIAGNOSIS — L0390 Cellulitis, unspecified: Secondary | ICD-10-CM

## 2021-02-16 LAB — BASIC METABOLIC PANEL
Anion gap: 7 (ref 5–15)
BUN: 11 mg/dL (ref 6–20)
CO2: 26 mmol/L (ref 22–32)
Calcium: 8.6 mg/dL — ABNORMAL LOW (ref 8.9–10.3)
Chloride: 102 mmol/L (ref 98–111)
Creatinine, Ser: 0.75 mg/dL (ref 0.61–1.24)
GFR, Estimated: >60 mL/min (ref >60)
Glucose, Bld: 118 mg/dL — ABNORMAL HIGH (ref 70–99)
Potassium: 4.3 mmol/L (ref 3.5–5.1)
Sodium: 135 mmol/L (ref 135–145)

## 2021-02-16 LAB — CBC
HCT: 39 % (ref 39.0–52.0)
Hemoglobin: 12.5 g/dL — ABNORMAL LOW (ref 13.0–17.0)
MCH: 28.6 pg (ref 26.0–34.0)
MCHC: 32.1 g/dL (ref 30.0–36.0)
MCV: 89.2 fL (ref 80.0–100.0)
Platelets: 289 10*3/uL (ref 150–400)
RBC: 4.37 MIL/uL (ref 4.22–5.81)
RDW: 14 % (ref 11.5–15.5)
WBC: 10.3 10*3/uL (ref 4.0–10.5)
nRBC: 0 % (ref 0.0–0.2)

## 2021-02-16 LAB — VANCOMYCIN, PEAK
Vancomycin Pk: 47 ug/mL — ABNORMAL HIGH (ref 30–40)
Vancomycin Pk: 36 ug/mL (ref 30–40)

## 2021-02-16 LAB — PROTIME-INR
INR: 1.4 — ABNORMAL HIGH (ref 0.8–1.2)
Prothrombin Time: 17.2 seconds — ABNORMAL HIGH (ref 11.4–15.2)

## 2021-02-16 LAB — GLUCOSE, CAPILLARY
Glucose-Capillary: 123 mg/dL — ABNORMAL HIGH (ref 70–99)
Glucose-Capillary: 127 mg/dL — ABNORMAL HIGH (ref 70–99)
Glucose-Capillary: 130 mg/dL — ABNORMAL HIGH (ref 70–99)
Glucose-Capillary: 124 mg/dL — ABNORMAL HIGH (ref 70–99)

## 2021-02-16 LAB — HEPARIN LEVEL (UNFRACTIONATED)
Heparin Unfractionated: 0.2 IU/mL — ABNORMAL LOW (ref 0.30–0.70)
Heparin Unfractionated: 0.22 IU/mL — ABNORMAL LOW (ref 0.30–0.70)

## 2021-02-16 LAB — VANCOMYCIN, TROUGH: Vancomycin Tr: 17 ug/mL (ref 15–20)

## 2021-02-16 MED ORDER — WARFARIN - PHARMACIST DOSING INPATIENT: Freq: Every day | Status: DC

## 2021-02-16 MED ORDER — HEPARIN BOLUS VIA INFUSION
3000.0000 [IU] | Freq: Once | INTRAVENOUS | Status: AC
  Administered 2021-02-16: 3000 [IU] via INTRAVENOUS
  Filled 2021-02-16: qty 3000

## 2021-02-16 MED ORDER — WARFARIN SODIUM 7.5 MG PO TABS
15.0000 mg | ORAL_TABLET | Freq: Once | ORAL | Status: AC
  Administered 2021-02-16: 15 mg via ORAL
  Filled 2021-02-16: qty 2

## 2021-02-16 MED ORDER — ENOXAPARIN SODIUM 300 MG/3ML IJ SOLN
160.0000 mg | Freq: Two times a day (BID) | INTRAMUSCULAR | Status: DC
  Administered 2021-02-17: 160 mg via SUBCUTANEOUS
  Filled 2021-02-16 (×3): qty 1.6

## 2021-02-16 MED ORDER — ENOXAPARIN SODIUM 300 MG/3ML IJ SOLN
160.0000 mg | Freq: Once | INTRAMUSCULAR | Status: AC
  Administered 2021-02-16: 160 mg via SUBCUTANEOUS
  Filled 2021-02-16: qty 1.6

## 2021-02-16 NOTE — Progress Notes (Addendum)
ANTICOAGULATION CONSULT NOTE - Initial Consult  Pharmacy Consult for Heparin>>lovenox/coumadin Indication: anticoagulation for aortic valve  Allergies  Allergen Reactions  . Pollen Extract Other (See Comments)    Runny nose, watery eyes, sneezing   Patient Measurements: Height: 5\' 10"  (177.8 cm) Weight: (!) 161 kg (355 lb) IBW/kg (Calculated) : 73 HEPARIN DW (KG): 112.2  Vital Signs: Temp: 98.2 F (36.8 C) (05/23 1019) Temp Source: Oral (05/23 1019) BP: 125/67 (05/23 1019) Pulse Rate: 71 (05/23 1019)  Labs: Recent Labs    02/14/21 0533 02/15/21 0428 02/15/21 1909 02/16/21 0413 02/16/21 1245  HGB 12.4* 13.0  --  12.5*  --   HCT 39.3 41.1  --  39.0  --   PLT 252 284  --  289  --   LABPROT 21.5* 18.7*  --  17.2*  --   INR 1.9* 1.6*  --  1.4*  --   HEPARINUNFRC  --   --  <0.10* 0.20* 0.22*  CREATININE 0.74 0.71  --  0.75  --    Estimated Creatinine Clearance: 180.3 mL/min (by C-G formula based on SCr of 0.75 mg/dL).  Medical History: Past Medical History:  Diagnosis Date  . Anxiety   . Aortic stenosis   . Cellulitis and abscess of lower extremity 06/11/2019  . Cellulitis of fourth toe of left foot   . Cholelithiasis   . Coronary artery disease    Nonobstructive CAD (40-50% LAD) 08/2019  . Depression   . Depression    Phreesia 09/27/2020  . Depression    Phreesia 11/01/2020  . Diabetes mellitus without complication (HCC)    Phreesia 09/27/2020  . Elevated troponin level not due myocardial infarction 11/11/2019  . Essential hypertension   . Gangrene of toe of left foot (HCC) 07/06/2019  . Heart murmur    Phreesia 09/27/2020  . Hyperlipidemia    Phreesia 09/27/2020  . Hypertension    Phreesia 09/27/2020  . Mixed hyperlipidemia   . Morbid obesity (HCC)   . S/P aortic valve replacement with mechanical valve 12/05/2019   25 mm Carbomedics top hat bileaflet mechanical valve via partial upper hemi-sternotomy  . Severe aortic stenosis 09/24/2019  . Type 2  diabetes mellitus (HCC)     Medications:  Medications Prior to Admission  Medication Sig Dispense Refill Last Dose  . atorvastatin (LIPITOR) 80 MG tablet Take 1 tablet (80 mg total) by mouth daily. 90 tablet 3 02/12/2021 at Unknown time  . insulin glargine (LANTUS SOLOSTAR) 100 UNIT/ML Solostar Pen Inject 30 Units into the skin at bedtime. 30 mL 3 02/11/2021 at Unknown time  . lisinopril (ZESTRIL) 20 MG tablet TAKE 1 Tablet BY MOUTH ONCE EVERY DAY 90 tablet 2 02/12/2021 at Unknown time  . metoprolol tartrate (LOPRESSOR) 25 MG tablet Take 1 tablet (25 mg total) by mouth 2 (two) times daily. 180 tablet 1 02/12/2021 at 01130  . sertraline (ZOLOFT) 50 MG tablet Take 1 tablet by mouth once daily 30 tablet 0 02/12/2021 at Unknown time  . sitaGLIPtin-metformin (JANUMET) 50-500 MG tablet Take 1 tablet by mouth 2 (two) times daily with a meal. 180 tablet 3 02/12/2021 at Unknown time  . warfarin (COUMADIN) 10 MG tablet Take 1 tablet daily except 1 1/2 tablets on Mondays, Wednesdays and Fridays or as directed 45 tablet 6 02/11/2021 at 1900    Assessment: 45 yo male with diabetic foot infection/abscess and on heparin. He is on warfarin PTA for a mechanical AVR.  Plans for possible surgical procedure (plans for  debridement  and drainage of abscess)   Heparin level came back low again this PM. [facility] is evaluating for possible surgery. We will re-bolus him and increase rate.   Addendum  Ok to transition to SQ lovenox and resume his coumadin for possible discharge tomorrow.   Goal of Therapy:  Heparin level 0.3-0.7 units/ml Monitor platelets by anticoagulation protocol: Yes   Plan:  Dc heparin Lovenox 160mg  SQ x1 then BID Coumadin 15mg  x1  Daily INR  , PharmD, BCIDP, AAHIVP, CPP Infectious Disease Pharmacist 02/16/2021 1:35 PM

## 2021-02-16 NOTE — Progress Notes (Signed)
VASCULAR SURGERY:   ABI is pending.   02/16/2021 12:33 PM  Waverly Ferrari, MD

## 2021-02-16 NOTE — Progress Notes (Signed)
ANTICOAGULATION CONSULT NOTE  Pharmacy Consult for Heparin Indication: mechanical aortic valve  Allergies  Allergen Reactions  . Pollen Extract Other (See Comments)    Runny nose, watery eyes, sneezing   Patient Measurements: Height: 5\' 10"  (177.8 cm) Weight: (!) 161 kg (355 lb) IBW/kg (Calculated) : 73 HEPARIN DW (KG): 112.2  Vital Signs: Temp: 98.3 F (36.8 C) (05/22 2101) Temp Source: Oral (05/22 2101) BP: 112/62 (05/22 2101) Pulse Rate: 70 (05/22 2101)  Labs: Recent Labs    02/14/21 0533 02/15/21 0428 02/15/21 1909 02/16/21 0413  HGB 12.4* 13.0  --  12.5*  HCT 39.3 41.1  --  39.0  PLT 252 284  --  289  LABPROT 21.5* 18.7*  --  17.2*  INR 1.9* 1.6*  --  1.4*  HEPARINUNFRC  --   --  <0.10* 0.20*  CREATININE 0.74 0.71  --  0.75   Estimated Creatinine Clearance: 180.3 mL/min (by C-G formula based on SCr of 0.75 mg/dL).  Assessment: 45 y.o. male with AVR, Coumadin on hold, for heparin Goal of Therapy:  Heparin level 0.3-0.7 units/ml Monitor platelets by anticoagulation protocol: Yes   Plan:  Heparin 3000 units IV bolus, then increase heparin  2400 units/hr Check heparin level in 6 hours.   59, PharmD, BCPS

## 2021-02-16 NOTE — Progress Notes (Signed)
VASCULAR LAB    ABIs have been performed.  See CV proc for preliminary results.   Rolondo Pierre, RVT 02/16/2021, 1:40 PM

## 2021-02-17 DIAGNOSIS — E11628 Type 2 diabetes mellitus with other skin complications: Secondary | ICD-10-CM

## 2021-02-17 DIAGNOSIS — L089 Local infection of the skin and subcutaneous tissue, unspecified: Secondary | ICD-10-CM

## 2021-02-17 LAB — GLUCOSE, CAPILLARY
Glucose-Capillary: 119 mg/dL — ABNORMAL HIGH (ref 70–99)
Glucose-Capillary: 196 mg/dL — ABNORMAL HIGH (ref 70–99)

## 2021-02-17 LAB — BASIC METABOLIC PANEL
Anion gap: 5 (ref 5–15)
BUN: 8 mg/dL (ref 6–20)
CO2: 27 mmol/L (ref 22–32)
Calcium: 8.7 mg/dL — ABNORMAL LOW (ref 8.9–10.3)
Chloride: 104 mmol/L (ref 98–111)
Creatinine, Ser: 0.74 mg/dL (ref 0.61–1.24)
GFR, Estimated: >60 mL/min (ref >60)
Glucose, Bld: 111 mg/dL — ABNORMAL HIGH (ref 70–99)
Potassium: 4.2 mmol/L (ref 3.5–5.1)
Sodium: 136 mmol/L (ref 135–145)

## 2021-02-17 LAB — CBC
HCT: 38.7 % — ABNORMAL LOW (ref 39.0–52.0)
Hemoglobin: 12.6 g/dL — ABNORMAL LOW (ref 13.0–17.0)
MCH: 28.8 pg (ref 26.0–34.0)
MCHC: 32.6 g/dL (ref 30.0–36.0)
MCV: 88.4 fL (ref 80.0–100.0)
Platelets: 260 10*3/uL (ref 150–400)
RBC: 4.38 MIL/uL (ref 4.22–5.81)
RDW: 14 % (ref 11.5–15.5)
WBC: 9.3 10*3/uL (ref 4.0–10.5)
nRBC: 0 % (ref 0.0–0.2)

## 2021-02-17 LAB — PROTIME-INR
INR: 1.3 — ABNORMAL HIGH (ref 0.8–1.2)
Prothrombin Time: 16 seconds — ABNORMAL HIGH (ref 11.4–15.2)

## 2021-02-17 MED ORDER — DOXYCYCLINE HYCLATE 100 MG PO TABS
100.0000 mg | ORAL_TABLET | Freq: Two times a day (BID) | ORAL | Status: DC
  Administered 2021-02-17: 100 mg via ORAL
  Filled 2021-02-17: qty 1

## 2021-02-17 MED ORDER — DOXYCYCLINE HYCLATE 100 MG PO TABS: 100.0000 mg | ORAL_TABLET | Freq: Two times a day (BID) | ORAL | 0 refills | Status: AC

## 2021-02-17 MED ORDER — AMOXICILLIN-POT CLAVULANATE 875-125 MG PO TABS
1.0000 | ORAL_TABLET | Freq: Two times a day (BID) | ORAL | Status: DC
  Administered 2021-02-17: 1 via ORAL
  Filled 2021-02-17: qty 1

## 2021-02-17 MED ORDER — WARFARIN SODIUM 7.5 MG PO TABS: 15.0000 mg | ORAL_TABLET | Freq: Once | ORAL | Status: DC

## 2021-02-17 MED ORDER — WARFARIN SODIUM 10 MG PO TABS: ORAL_TABLET | ORAL | 1 refills | Status: DC

## 2021-02-17 MED ORDER — AMOXICILLIN-POT CLAVULANATE 875-125 MG PO TABS: 1.0000 | ORAL_TABLET | Freq: Two times a day (BID) | ORAL | 0 refills | Status: DC

## 2021-02-17 MED ORDER — ENOXAPARIN SODIUM 300 MG/3ML IJ SOLN: 160.0000 mg | Freq: Two times a day (BID) | INTRAMUSCULAR | 0 refills | Status: DC

## 2021-02-17 MED ORDER — VANCOMYCIN HCL 1500 MG/300ML IV SOLN
1500.0000 mg | Freq: Two times a day (BID) | INTRAVENOUS | Status: DC
  Administered 2021-02-17: 1500 mg via INTRAVENOUS
  Filled 2021-02-17: qty 300

## 2021-02-17 MED ORDER — AMOXICILLIN 500 MG PO CAPS
1000.0000 mg | ORAL_CAPSULE | Freq: Every day | ORAL | Status: DC
  Filled 2021-02-17: qty 2

## 2021-02-17 MED ORDER — AMOXICILLIN 500 MG PO CAPS: 1000.0000 mg | ORAL_CAPSULE | Freq: Every day | ORAL | 0 refills | Status: DC

## 2021-02-17 NOTE — Progress Notes (Signed)
Discharge instructions (including medications) discussed with and copy provided to patient/caregiver 

## 2021-02-17 NOTE — Progress Notes (Addendum)
  Progress Note    02/17/2021 1:36 PM Hospital Day 5  Subjective:  No complaints; ready to go home.  afebrile  Vitals:   02/17/21 0556 02/17/21 0930  BP: 131/69 118/69  Pulse: 60 62  Resp: 17 18  Temp: 97.9 F (36.6 C) 98 F (36.7 C)  SpO2: 98% 100%    Physical Exam: General:  No distress Lungs:  Non labored Extremities:  Left foot wound dressing removed.  No purulent drainage.  Packed with saline 4x4   CBC    Component Value Date/Time   WBC 9.3 02/17/2021 0457   RBC 4.38 02/17/2021 0457   HGB 12.6 (L) 02/17/2021 0457   HGB 13.9 02/02/2021 0937   HCT 38.7 (L) 02/17/2021 0457   HCT 41.3 02/02/2021 0937   PLT 260 02/17/2021 0457   PLT 249 02/02/2021 0937   MCV 88.4 02/17/2021 0457   MCV 86 02/02/2021 0937   MCH 28.8 02/17/2021 0457   MCHC 32.6 02/17/2021 0457   RDW 14.0 02/17/2021 0457   RDW 13.9 02/02/2021 0937   LYMPHSABS 1.6 02/12/2021 1455   LYMPHSABS 1.7 02/02/2021 0937   MONOABS 1.2 (H) 02/12/2021 1455   EOSABS 0.1 02/12/2021 1455   EOSABS 0.2 02/02/2021 0937   BASOSABS 0.1 02/12/2021 1455   BASOSABS 0.1 02/02/2021 0937    BMET    Component Value Date/Time   NA 136 02/17/2021 0457   NA 134 02/02/2021 0937   K 4.2 02/17/2021 0457   CL 104 02/17/2021 0457   CO2 27 02/17/2021 0457   GLUCOSE 111 (H) 02/17/2021 0457   BUN 8 02/17/2021 0457   BUN 13 02/02/2021 0937   CREATININE 0.74 02/17/2021 0457   CREATININE 0.75 06/13/2020 0837   CALCIUM 8.7 (L) 02/17/2021 0457   GFRNONAA >60 02/17/2021 0457   GFRNONAA 112 06/13/2020 0837   GFRAA 126 10/31/2020 0944   GFRAA 129 06/13/2020 0837    INR    Component Value Date/Time   INR 1.3 (H) 02/17/2021 0457     Intake/Output Summary (Last 24 hours) at 02/17/2021 1336 Last data filed at 02/17/2021 0600 Gross per 24 hour  Intake 661.88 ml  Output 0 ml  Net 661.88 ml     Assessment/Plan:  45 y.o. male with diabetic foot infection left foot Hospital Day 5  -dressing changed.  No purulent  drainage. -pt will need HH for dressing changes.  The saline I used today, pt will take with him to use for dressings.  -will f/u with Dr. Arbie Cookey in a couple of weeks in [place].  -abx per primary team  Doreatha Massed, PA-C [facility] [phone] 02/17/2021 1:36 PM   I have interviewed the patient and examined the patient. I agree with the findings by the PA.  I reviewed his duplex scan which showed on the left side, which is the side of concern, he has a multiphasic posterior tibial and dorsalis pedis signal with an ABI of 100% and toe pressure 154 mmHg.  This would suggest adequate circulation for healing.  He will continue aggressive wound care as an outpatient and is being discharged on po ABx.  He will follow-up with Dr. Gretta Began in [place] as an outpatient.  We have made these arrangements.  Cari Caraway, MD [phone]

## 2021-02-17 NOTE — Discharge Summary (Signed)
Physician Discharge Summary  Adam Long ZYS:[id number] DOB: [birth year] DOA: 02/12/2021  PCP: Heather Roberts, NP  Admit date: 02/12/2021 Discharge date: 02/17/2021  Admitted From: Home Disposition: Home  Recommendations for Outpatient Follow-up:  1. Follow up with PCP in 1-2 weeks 2. Please obtain BMP/CBC in one week 3. Please follow up with vascular surgery Dr. Arbie Cookey as scheduled  Home Health: None Equipment/Devices: None indicated  Discharge Condition: Stable CODE STATUS: Full Diet recommendation: Low-salt low-fat diabetic diet  Brief/Interim Summary: 45 y.o.malewith medical history significant forT2DM, prior aortic valve replacement, CAD, hypertension, hyperlipidemia and depression who presents to the emergency department due to some soreness to the lateral part of left foot x 1 week.  He noted what he felt to be a blister on the lateral aspect of his left foot 1 week prior to admission.  He noted increase swelling, erythema and malodorous drainage over the next 2-3 days.  He went to urgent care whom directed him to go to the ED.  wound, but realized that it was a wound yesterday due to foul-smelling odor from the wound. Left foot x-ray showedsoft tissue swelling. No soft tissue air or radiopaque foreign bodyon x-ray.Placed onvancomycin and zosyn.  WBC gradually trending down but left foot continued to have drainage, edema and erythema, not much improved.  MRI was neg for osteomyelitis but showed 22x16 mm abscess.  General surgery was consulted and felt that patient may be better served being evaluated by vascular surgery.  Patient had amputation of left 4th and left 5 th toes in Oct 2020.  Patient transferred to [facility] for vascular surgery evaluation with ABIs performed, no further indication for intervention procedure or debridement, patient did have wound opened and somewhat irrigated on initial presentation and given ongoing drainage is stable for discharge home on p.o.  antibiotics.  Infectious disease and pharmacy were consulted recommending doxycycline and amoxicillin/Augmentin for 10 days to cover infectious wound.  Patient's Coumadin was held at intake for possible need for procedure as such his INR is minimally low today we will transition home on Lovenox and Coumadin.  He will need close follow-up with Coumadin clinic in the next few days per their schedule.  Patient will also follow-up with vascular surgery Dr. Arbie Cookey per their schedule for further evaluation and possibly repeat imaging of his left foot wound.  Patient otherwise stable and agreeable for discharge home.  Discharge Diagnoses:  Principal Problem:   Open wound of left foot Active Problems:   HTN (hypertension)   Hyperlipidemia   Morbid obesity (HCC)   S/P aortic valve replacement with mechanical valve   Cellulitis of left foot   Leukocytosis   Hyponatremia   Normocytic anemia   Lactic acidosis   Hyperglycemia due to diabetes mellitus (HCC)   Dehydration   Diabetic ulcer of left foot associated with type 1 diabetes mellitus, limited to breakdown of skin (HCC)   Diabetic foot infection [facility])    Discharge Instructions  Discharge Instructions    Call MD for:  redness, tenderness, or signs of infection (pain, swelling, redness, odor or green/yellow discharge around incision site)   Complete by: As directed    Call MD for:  severe uncontrolled pain   Complete by: As directed    Call MD for:  temperature >100.4   Complete by: As directed    Diet - low sodium heart healthy   Complete by: As directed    Diet Carb Modified   Complete by: As directed  Discharge wound care:   Complete by: As directed    Clean left foot wound with soap and water twice daily   Increase activity slowly   Complete by: As directed      Allergies as of 02/17/2021      Reactions   Pollen Extract Other (See Comments)   Runny nose, watery eyes, sneezing      Medication List    TAKE these medications    amoxicillin 500 MG capsule Commonly known as: AMOXIL Take 2 capsules (1,000 mg total) by mouth daily at 4 PM.   amoxicillin-clavulanate 875-125 MG tablet Commonly known as: AUGMENTIN Take 1 tablet by mouth 2 (two) times daily.   atorvastatin 80 MG tablet Commonly known as: LIPITOR Take 1 tablet (80 mg total) by mouth daily.   doxycycline 100 MG tablet Commonly known as: VIBRA-TABS Take 1 tablet (100 mg total) by mouth every 12 (twelve) hours for 10 days.   enoxaparin 100 mg/mL Soln injection Commonly known as: LOVENOX Inject 1.6 mLs (160 mg total) into the skin every 12 (twelve) hours.   Janumet 50-500 MG tablet Generic drug: sitaGLIPtin-metformin Take 1 tablet by mouth 2 (two) times daily with a meal.   Lantus SoloStar 100 UNIT/ML Solostar Pen Generic drug: insulin glargine Inject 30 Units into the skin at bedtime.   lisinopril 20 MG tablet Commonly known as: ZESTRIL TAKE 1 Tablet BY MOUTH ONCE EVERY DAY   metoprolol tartrate 25 MG tablet Commonly known as: LOPRESSOR Take 1 tablet (25 mg total) by mouth 2 (two) times daily.   sertraline 50 MG tablet Commonly known as: ZOLOFT Take 1 tablet by mouth once daily   warfarin 10 MG tablet Commonly known as: COUMADIN Take as directed. If you are unsure how to take this medication, talk to your nurse or doctor. Original instructions: Take 1 tablet daily, 1/2 tablet on Tuesdays only What changed: additional instructions            Discharge Care Instructions  (From admission, onward)         Start     Ordered   02/17/21 0000  Discharge wound care:       Comments: Clean left foot wound with soap and water twice daily   02/17/21 1425          Follow-up Information    Franky Macho, MD. Schedule an appointment as soon as possible for a visit on 02/26/2021.   Specialty: General Surgery Contact information: [address] [place] [address] [phone]        [facility]  In 2 weeks.   Specialty: Vascular Surgery Why: Office will call you to arrange your appt (sent) Contact information: [address], [address] [place] [address] [phone]             Allergies  Allergen Reactions  . Pollen Extract Other (See Comments)    Runny nose, watery eyes, sneezing    Consultations:  Vascular surgery   Procedures/Studies: MR FOOT LEFT WO CONTRAST  Result Date: 02/13/2021 CLINICAL DATA:  History of prior amputations with open wound on the lateral aspect of the forefoot. EXAM: MRI OF THE LEFT FOOT WITHOUT CONTRAST TECHNIQUE: Multiplanar, multisequence MR imaging of the left was performed. No intravenous contrast was administered. COMPARISON:  Radiographs, same date.  Prior MRI from 2020. FINDINGS: Postoperative changes related to prior amputations involving the fourth and fifth toes. There is an open wound on the plantar aspect of the forefoot laterally. There  is an underlying fluid collection consistent with an abscess measuring approximately 22 x 16 mm. This abscess is right down on the osteotomy site at the proximal phalanx of the fifth toe. No definite findings for osteomyelitis. Surrounding cellulitis and myofasciitis. No findings suspicious for pyomyositis. There is a complex multi septated cystic lesion noted between the first and second metatarsals measuring approximately 4.0 x 2.5 cm. This is most likely a ganglion cyst given its appearance and appears to be emanating from the first or second MTP joints. Moderate stable appearing midfoot degenerative changes with joint space narrowing, subchondral cystic change and marrow edema. IMPRESSION: 1. Open wound on the plantar aspect of the forefoot laterally with an underlying 22 x 16 mm abscess. 2. No definite findings for osteomyelitis. 3. Surrounding cellulitis and myofasciitis. 4. 4.0 x 2.5 cm complex multi septated cystic lesion between the first and second metatarsals. This is most  likely a ganglion cyst. 5. Moderate stable appearing midfoot degenerative changes. Electronically Signed   By: Rudie Meyer M.D.   On: 02/13/2021 08:05   DG Foot Complete Left  Result Date: 02/12/2021 CLINICAL DATA:  Foot ulcer EXAM: LEFT FOOT - COMPLETE 3+ VIEW COMPARISON:  June 11, 2019 FINDINGS: Frontal, oblique, and lateral views obtained. There is been amputation at the level of the proximal fourth and fifth proximal phalanges. There is soft tissue swelling dorsally. No acute fracture or dislocation. Remaining joint spaces appear normal. No erosive change or bony destruction. No soft tissue air or radiopaque foreign body. IMPRESSION: Soft tissue swelling. No soft tissue air or radiopaque foreign body. Status post amputations at the proximal fourth and fifth proximal phalangeal levels. Remaining bony structures appear intact. No bony destruction or erosion. No fracture or dislocation. Remaining joint spaces normal. Electronically Signed   By: Bretta Bang III M.D.   On: 02/12/2021 14:38   VAS [place] ABI WITH/WO TBI  Result Date: 02/16/2021  LOWER EXTREMITY DOPPLER STUDY Patient Name:  TYRRELL STEPHENS  Date of Exam:   02/16/2021 Medical Rec #: [id number]        Accession #:    [id number] Date of Birth: 08/24/1976        Patient Gender: M Patient Age:   32Y Exam Location:  [facility] Procedure:      VAS [place] ABI WITH/WO TBI Referring Phys: [id number] CHRISTOPHER S DICKSON --------------------------------------------------------------------------------  Indications: Ulceration, and Cellulitis. High Risk Factors: Hypertension, hyperlipidemia, Diabetes. Other Factors: History of aortic valve replacement.  Comparison Study: Prior study done 04/11/19 states vessels were noncompressible,                   no pressures are noted Performing Technologist: Sherren Kerns RVS  Examination Guidelines: A complete evaluation includes at minimum, Doppler waveform signals and systolic blood pressure reading at the  level of bilateral brachial, anterior tibial, and posterior tibial arteries, when vessel segments are accessible. Bilateral testing is considered an integral part of a complete examination. Photoelectric Plethysmograph (PPG) waveforms and toe systolic pressure readings are included as required and additional duplex testing as needed. Limited examinations for reoccurring indications may be performed as noted.  ABI Findings: +---------+------------------+-----+-----------+--------+ Right    Rt Pressure (mmHg)IndexWaveform   Comment  +---------+------------------+-----+-----------+--------+ Brachial 141                    multiphasic         +---------+------------------+-----+-----------+--------+ PTA      198  1.40 multiphasic         +---------+------------------+-----+-----------+--------+ DP       254               1.80 multiphasic         +---------+------------------+-----+-----------+--------+ Great Toe153               1.09                     +---------+------------------+-----+-----------+--------+ +---------+------------------+-----+-----------+-------+ Left     Lt Pressure (mmHg)IndexWaveform   Comment +---------+------------------+-----+-----------+-------+ Brachial 136                    multiphasic        +---------+------------------+-----+-----------+-------+ PTA      143               1.01 multiphasic        +---------+------------------+-----+-----------+-------+ DP       141               1.00 multiphasic        +---------+------------------+-----+-----------+-------+ Great Toe154               1.09                    +---------+------------------+-----+-----------+-------+ +-------+-----------+-----------+------------+------------+ ABI/TBIToday's ABIToday's TBIPrevious ABIPrevious TBI +-------+-----------+-----------+------------+------------+ Right  1.8        1.09                                 +-------+-----------+-----------+------------+------------+ Left   1.01       1.09                                +-------+-----------+-----------+------------+------------+  Summary: Right: Resting right ankle-brachial index indicates noncompressible right lower extremity arteries. The right toe-brachial index is normal. ABIs are unreliable. TBIs are unreliable. Left: Resting left ankle-brachial index is within normal range. No evidence of significant left lower extremity arterial disease. The left toe-brachial index is normal. ABIs are unreliable. TBIs are unreliable.  *See table(s) above for measurements and observations.  Electronically signed by Sherald Hess MD on 02/16/2021 at 3:33:35 PM.    Final      Subjective: No acute issues or events overnight denies nausea vomiting diarrhea constipation headache fevers chills, left foot pain currently well controlled, minimal to no purulent output over the past 24 hours per the patient.   Discharge Exam: Vitals:   02/17/21 0556 02/17/21 0930  BP: 131/69 118/69  Pulse: 60 62  Resp: 17 18  Temp: 97.9 F (36.6 C) 98 F (36.7 C)  SpO2: 98% 100%   Vitals:   02/16/21 2107 02/16/21 2214 02/17/21 0556 02/17/21 0930  BP: (!) 99/58 122/70 131/69 118/69  Pulse: 68 61 60 62  Resp: Temp: 98.1 F (36.7 C) 98.1 F (36.7 C) 97.9 F (36.6 C) 98 F (36.7 C)  TempSrc: Oral Oral Oral Oral  SpO2: 100% 100% 98% 100%  Weight:  (!) 161.1 kg    Height:        General: Pt is alert, awake, not in acute distress Cardiovascular: RRR, S1/S2 +, no rubs, no gallops Respiratory: CTA bilaterally, no wheezing, no rhonchi Abdominal: Soft, NT, ND, bowel sounds + Extremities: no edema, no cyanosis -right first and second toe previous amputation with notable 2 cm  x 3 cm medial ulceration with purulent drainage, well-healing borders    The results of significant diagnostics from this hospitalization (including imaging, microbiology, ancillary  and laboratory) are listed below for reference.     Microbiology: Recent Results (from the past 240 hour(s))  Culture, blood (routine x 2)     Status: None (Preliminary result)   Collection Time: 02/12/21  6:09 PM   Specimen: BLOOD  Result Value Ref Range Status   Specimen Description BLOOD RIGHT ANTECUBITAL  Final   Special Requests   Final    Blood Culture adequate volume BOTTLES DRAWN AEROBIC AND ANAEROBIC   Culture   Final    NO GROWTH 4 DAYS Performed at [facility], [address], [place], [place] [address]    Report Status PENDING  Incomplete  Culture, blood (routine x 2)     Status: None (Preliminary result)   Collection Time: 02/12/21  6:09 PM   Specimen: BLOOD LEFT ARM  Result Value Ref Range Status   Specimen Description BLOOD LEFT ARM  Final   Special Requests   Final    Blood Culture adequate volume BOTTLES DRAWN AEROBIC AND ANAEROBIC   Culture   Final    NO GROWTH 4 DAYS Performed at [facility], [address] [place], [place] [address]    Report Status PENDING  Incomplete  Resp Panel by RT-PCR (Flu A&B, Covid) Nasopharyngeal Swab     Status: None   Collection Time: 02/12/21  9:00 PM   Specimen: Nasopharyngeal Swab; Nasopharyngeal(NP) swabs in vial transport medium  Result Value Ref Range Status   SARS Coronavirus 2 by RT PCR NEGATIVE NEGATIVE Final    Comment: (NOTE) SARS-CoV-2 target nucleic acids are NOT DETECTED.  The SARS-CoV-2 RNA is generally detectable in upper respiratory specimens during the acute phase of infection. The lowest concentration of SARS-CoV-2 viral copies this assay can detect is 138 copies/mL. A negative result does not preclude SARS-Cov-2 infection and should not be used as the sole basis for treatment or other patient management decisions. A negative result may occur with  improper specimen collection/handling, submission of specimen other than nasopharyngeal swab, presence of viral mutation(s) within the areas targeted by this  assay, and inadequate number of viral copies(<138 copies/mL). A negative result must be combined with clinical observations, patient history, and epidemiological information. The expected result is Negative.  Fact Sheet for Patients:  BloggerCourse.com  Fact Sheet for Healthcare Providers:  SeriousBroker.it  This test is no t yet approved or cleared by the [place] FDA and  has been authorized for detection and/or diagnosis of SARS-CoV-2 by FDA under an Emergency Use Authorization (EUA). This EUA will remain  in effect (meaning this test can be used) for the duration of the COVID-19 declaration under Section 564(b)(1) of the Act, 21 U.S.C.section 360bbb-3(b)(1), unless the authorization is terminated  or revoked sooner.       Influenza A by PCR NEGATIVE NEGATIVE Final   Influenza B by PCR NEGATIVE NEGATIVE Final    Comment: (NOTE) The Xpert Xpress SARS-CoV-2/FLU/RSV plus assay is intended as an aid in the diagnosis of influenza from Nasopharyngeal swab specimens and should not be used as a sole basis for treatment. Nasal washings and aspirates are unacceptable for Xpert Xpress SARS-CoV-2/FLU/RSV testing.  Fact Sheet for Patients: BloggerCourse.com  Fact Sheet for Healthcare Providers: SeriousBroker.it  This test is not yet approved or cleared by the [place] FDA and has been authorized for detection and/or diagnosis of SARS-CoV-2 by FDA  under an Emergency Use Authorization (EUA). This EUA will remain in effect (meaning this test can be used) for the duration of the COVID-19 declaration under Section 564(b)(1) of the Act, 21 U.S.C. section 360bbb-3(b)(1), unless the authorization is terminated or revoked.  Performed at [facility], [address] [place], [place] [address]      Labs: BNP (last 3 results) No results for input(s): BNP in the last 8760  hours. Basic Metabolic Panel: Recent Labs  Lab 02/13/21 0443 02/14/21 0533 02/15/21 0428 02/16/21 0413 02/17/21 0457  NA 133* 135 134* 135 136  K 4.1 4.3 4.2 4.3 4.2  CL 99 100 100 102 104  CO2 27 28 28 26 27   GLUCOSE 118* 133* 117* 118* 111*  BUN 13 13 13 11 8   CREATININE 0.75 0.74 0.71 0.75 0.74  CALCIUM 8.7* 8.9 8.6* 8.6* 8.7*  MG 1.8  --   --   --   --   PHOS 3.8  --   --   --   --    Liver Function Tests: Recent Labs  Lab 02/13/21 0443 02/15/21 0428  AST 19 22  ALT 20 25  ALKPHOS 53 54  BILITOT 1.4* 1.1  PROT 7.0 7.5  ALBUMIN 3.0* 3.2*   No results for input(s): LIPASE, AMYLASE in the last 168 hours. No results for input(s): AMMONIA in the last 168 hours. CBC: Recent Labs  Lab 02/12/21 1455 02/13/21 0443 02/14/21 0533 02/15/21 0428 02/16/21 0413 02/17/21 0457  WBC 15.7* 11.4* 10.4 11.1* 10.3 9.3  NEUTROABS 12.7*  --   --   --   --   --   HGB 12.6* 12.5* 12.4* 13.0 12.5* 12.6*  HCT 39.0 38.8* 39.3 41.1 39.0 38.7*  MCV 89.9 90.2 90.6 90.3 89.2 88.4  PLT 262 247 252 284 289 260   Cardiac Enzymes: No results for input(s): CKTOTAL, CKMB, CKMBINDEX, TROPONINI in the last 168 hours. BNP: Invalid input(s): POCBNP CBG: Recent Labs  Lab 02/16/21 1139 02/16/21 1733 02/16/21 2103 02/17/21 0639 02/17/21 1145  GLUCAP 127* 130* 124* 119* 196*   D-Dimer No results for input(s): DDIMER in the last 72 hours. Hgb A1c No results for input(s): HGBA1C in the last 72 hours. Lipid Profile No results for input(s): CHOL, HDL, LDLCALC, TRIG, CHOLHDL, LDLDIRECT in the last 72 hours. Thyroid function studies No results for input(s): TSH, T4TOTAL, T3FREE, THYROIDAB in the last 72 hours.  Invalid input(s): FREET3 Anemia work up No results for input(s): VITAMINB12, FOLATE, FERRITIN, TIBC, IRON, RETICCTPCT in the last 72 hours. Urinalysis    Component Value Date/Time   COLORURINE STRAW (A) 12/03/2019 1133   APPEARANCEUR CLEAR 12/03/2019 1133   LABSPEC 1.006  12/03/2019 1133   PHURINE 6.0 12/03/2019 1133   GLUCOSEU NEGATIVE 12/03/2019 1133   HGBUR NEGATIVE 12/03/2019 1133   BILIRUBINUR NEGATIVE 12/03/2019 1133   KETONESUR NEGATIVE 12/03/2019 1133   PROTEINUR NEGATIVE 12/03/2019 1133   UROBILINOGEN 0.2 08/31/2011 1727   NITRITE NEGATIVE 12/03/2019 1133   LEUKOCYTESUR NEGATIVE 12/03/2019 1133   Sepsis Labs Invalid input(s): PROCALCITONIN,  WBC,  LACTICIDVEN Microbiology Recent Results (from the past 240 hour(s))  Culture, blood (routine x 2)     Status: None (Preliminary result)   Collection Time: 02/12/21  6:09 PM   Specimen: BLOOD  Result Value Ref Range Status   Specimen Description BLOOD RIGHT ANTECUBITAL  Final   Special Requests   Final    Blood Culture adequate volume BOTTLES DRAWN AEROBIC AND ANAEROBIC   Culture  Final    NO GROWTH 4 DAYS Performed at [facility], [address], [place], [place] [address]    Report Status PENDING  Incomplete  Culture, blood (routine x 2)     Status: None (Preliminary result)   Collection Time: 02/12/21  6:09 PM   Specimen: BLOOD LEFT ARM  Result Value Ref Range Status   Specimen Description BLOOD LEFT ARM  Final   Special Requests   Final    Blood Culture adequate volume BOTTLES DRAWN AEROBIC AND ANAEROBIC   Culture   Final    NO GROWTH 4 DAYS Performed at [facility], [address] [place], [place] [address]    Report Status PENDING  Incomplete  Resp Panel by RT-PCR (Flu A&B, Covid) Nasopharyngeal Swab     Status: None   Collection Time: 02/12/21  9:00 PM   Specimen: Nasopharyngeal Swab; Nasopharyngeal(NP) swabs in vial transport medium  Result Value Ref Range Status   SARS Coronavirus 2 by RT PCR NEGATIVE NEGATIVE Final    Comment: (NOTE) SARS-CoV-2 target nucleic acids are NOT DETECTED.  The SARS-CoV-2 RNA is generally detectable in upper respiratory specimens during the acute phase of infection. The lowest concentration of SARS-CoV-2 viral copies this assay can detect is 138  copies/mL. A negative result does not preclude SARS-Cov-2 infection and should not be used as the sole basis for treatment or other patient management decisions. A negative result may occur with  improper specimen collection/handling, submission of specimen other than nasopharyngeal swab, presence of viral mutation(s) within the areas targeted by this assay, and inadequate number of viral copies(<138 copies/mL). A negative result must be combined with clinical observations, patient history, and epidemiological information. The expected result is Negative.  Fact Sheet for Patients:  BloggerCourse.com  Fact Sheet for Healthcare Providers:  SeriousBroker.it  This test is no t yet approved or cleared by the [place] FDA and  has been authorized for detection and/or diagnosis of SARS-CoV-2 by FDA under an Emergency Use Authorization (EUA). This EUA will remain  in effect (meaning this test can be used) for the duration of the COVID-19 declaration under Section 564(b)(1) of the Act, 21 U.S.C.section 360bbb-3(b)(1), unless the authorization is terminated  or revoked sooner.       Influenza A by PCR NEGATIVE NEGATIVE Final   Influenza B by PCR NEGATIVE NEGATIVE Final    Comment: (NOTE) The Xpert Xpress SARS-CoV-2/FLU/RSV plus assay is intended as an aid in the diagnosis of influenza from Nasopharyngeal swab specimens and should not be used as a sole basis for treatment. Nasal washings and aspirates are unacceptable for Xpert Xpress SARS-CoV-2/FLU/RSV testing.  Fact Sheet for Patients: BloggerCourse.com  Fact Sheet for Healthcare Providers: SeriousBroker.it  This test is not yet approved or cleared by the [place] FDA and has been authorized for detection and/or diagnosis of SARS-CoV-2 by FDA under an Emergency Use Authorization (EUA). This EUA will remain in effect (meaning  this test can be used) for the duration of the COVID-19 declaration under Section 564(b)(1) of the Act, 21 U.S.C. section 360bbb-3(b)(1), unless the authorization is terminated or revoked.  Performed at [facility], [address], [place], [place] [address]      Time coordinating discharge: Over 30 minutes  SIGNED:   Azucena Fallen, DO [facility] 02/17/2021, 2:26 PM Pager   If 7PM-7AM, please contact night-coverage [email or web address]

## 2021-02-17 NOTE — Progress Notes (Signed)
Pharmacy Antibiotic Note  Adam Long is a 45 y.o. male admitted on 02/12/2021 with wound infection of L foot. Pharmacy has been consulted for vancomycin dosing. Lactic up, Cr <1 at baseline  Vanc peak 36, Vanc trough 17 AUC calculated 668 (goal 400-550)  Plan: Change Vancomycin to 1500mg  IV q12h to give estimated AUC 500 Will continue to f/u renal function, LOT, and micro data  Height: 5\' 10"  (177.8 cm) Weight: (!) 161 kg (355 lb) IBW/kg (Calculated) : 73  Temp (24hrs), Avg:98.1 F (36.7 C), Min:98 F (36.7 C), Max:98.3 F (36.8 C)  Recent Labs  Lab 02/12/21 1455 02/13/21 0443 02/13/21 0711 02/14/21 0533 02/15/21 0428 02/16/21 0413 02/16/21 1245 02/16/21 1406 02/16/21 2200  WBC 15.7* 11.4*  --  10.4 11.1* 10.3  --   --   --   CREATININE 0.69 0.75  --  0.74 0.71 0.75  --   --   --   LATICACIDVEN 2.5* 1.2 0.9  --   --   --   --   --   --   VANCOTROUGH  --   --   --   --   --   --   --   --  17  VANCOPEAK  --   --   --   --   --   --  47* 36  --     Estimated Creatinine Clearance: 180.3 mL/min (by C-G formula based on SCr of 0.75 mg/dL).    Allergies  Allergen Reactions  . Pollen Extract Other (See Comments)    Runny nose, watery eyes, sneezing   Vancomycin 5/19>> Zosyn 5/20>>  Cefepime x 1 dose 5/19  5/19 BCX: ngtd   6/19, PharmD, BCPS Please see amion for complete clinical pharmacist phone list 02/17/2021 12:33 AM

## 2021-02-17 NOTE — Progress Notes (Signed)
Ok to optimize vanc/zosyn to Augmentin + Doxy to complete a total of 10. Since he is morbidly obese, we will use an additional dose of amoxicillin with Augmentin. Per Dr Natale Milch  Augmentin 875mg  BID + amoxicillin 1000mg  q1600 x 5 more days Doxy 100mg  PO BID x 5 more days  , PharmD, [place], AAHIVP, CPP Infectious Disease Pharmacist 02/17/2021 10:24 AM

## 2021-02-17 NOTE — Progress Notes (Signed)
ANTICOAGULATION CONSULT NOTE - Initial Consult  Pharmacy Consult for Heparin>>lovenox/coumadin Indication: anticoagulation for aortic valve  Allergies  Allergen Reactions  . Pollen Extract Other (See Comments)    Runny nose, watery eyes, sneezing   Patient Measurements: Height: 5\' 10"  (177.8 cm) Weight: (!) 161.1 kg (355 lb 2.6 oz) IBW/kg (Calculated) : 73 HEPARIN DW (KG): 112.2  Vital Signs: Temp: 98 F (36.7 C) (05/24 0930) Temp Source: Oral (05/24 0930) BP: 118/69 (05/24 0930) Pulse Rate: 62 (05/24 0930)  Labs: Recent Labs    02/15/21 0428 02/15/21 1909 02/16/21 0413 02/16/21 1245 02/17/21 0457  HGB 13.0  --  12.5*  --  12.6*  HCT 41.1  --  39.0  --  38.7*  PLT 284  --  289  --  260  LABPROT 18.7*  --  17.2*  --  16.0*  INR 1.6*  --  1.4*  --  1.3*  HEPARINUNFRC  --  <0.10* 0.20* 0.22*  --   CREATININE 0.71  --  0.75  --  0.74   Estimated Creatinine Clearance: 180.3 mL/min (by C-G formula based on SCr of 0.74 mg/dL).  Medical History: Past Medical History:  Diagnosis Date  . Anxiety   . Aortic stenosis   . Cellulitis and abscess of lower extremity 06/11/2019  . Cellulitis of fourth toe of left foot   . Cholelithiasis   . Coronary artery disease    Nonobstructive CAD (40-50% LAD) 08/2019  . Depression   . Depression    Phreesia 09/27/2020  . Depression    Phreesia 11/01/2020  . Diabetes mellitus without complication (HCC)    Phreesia 09/27/2020  . Elevated troponin level not due myocardial infarction 11/11/2019  . Essential hypertension   . Gangrene of toe of left foot (HCC) 07/06/2019  . Heart murmur    Phreesia 09/27/2020  . Hyperlipidemia    Phreesia 09/27/2020  . Hypertension    Phreesia 09/27/2020  . Mixed hyperlipidemia   . Morbid obesity (HCC)   . S/P aortic valve replacement with mechanical valve 12/05/2019   25 mm Carbomedics top hat bileaflet mechanical valve via partial upper hemi-sternotomy  . Severe aortic stenosis 09/24/2019  . Type  2 diabetes mellitus (HCC)     Medications:  Medications Prior to Admission  Medication Sig Dispense Refill Last Dose  . atorvastatin (LIPITOR) 80 MG tablet Take 1 tablet (80 mg total) by mouth daily. 90 tablet 3 02/12/2021 at Unknown time  . insulin glargine (LANTUS SOLOSTAR) 100 UNIT/ML Solostar Pen Inject 30 Units into the skin at bedtime. 30 mL 3 02/11/2021 at Unknown time  . lisinopril (ZESTRIL) 20 MG tablet TAKE 1 Tablet BY MOUTH ONCE EVERY DAY 90 tablet 2 02/12/2021 at Unknown time  . metoprolol tartrate (LOPRESSOR) 25 MG tablet Take 1 tablet (25 mg total) by mouth 2 (two) times daily. 180 tablet 1 02/12/2021 at 01130  . sertraline (ZOLOFT) 50 MG tablet Take 1 tablet by mouth once daily 30 tablet 0 02/12/2021 at Unknown time  . sitaGLIPtin-metformin (JANUMET) 50-500 MG tablet Take 1 tablet by mouth 2 (two) times daily with a meal. 180 tablet 3 02/12/2021 at Unknown time  . warfarin (COUMADIN) 10 MG tablet Take 1 tablet daily except 1 1/2 tablets on Mondays, Wednesdays and Fridays or as directed 45 tablet 6 02/11/2021 at 1900    Assessment: 45 yo male with diabetic foot infection/abscess and on heparin. He is on warfarin PTA for a mechanical AVR.  Plans for possible surgical procedure (plans for  debridement and drainage of abscess)   Anticoagulation has been changed to Lovenox bridging while INR is subtherapeutic at 1.3. We will give another higher dose today.   Goal of Therapy:  Heparin level 0.3-0.7 units/ml Monitor platelets by anticoagulation protocol: Yes   Plan:  Lovenox 160mg  SQ BID Coumadin 15mg  x1  Daily INR  , PharmD, BCIDP, AAHIVP, CPP Infectious Disease Pharmacist 02/17/2021 2:17 PM   If dc home today, consider Lovenox 150mg  SQ BID and resume home dose coumadin 10mg  qday except 7.5 mg Tuesday. Check INR later this week.   02/19/2021, PharmD, BCIDP, AAHIVP, CPP Infectious Disease Pharmacist 02/17/2021 2:17 PM

## 2021-02-17 NOTE — Progress Notes (Addendum)
ANTICOAGULATION CONSULT NOTE - Initial Consult  Pharmacy Consult for Heparin>>lovenox/coumadin Indication: anticoagulation for aortic valve  Allergies  Allergen Reactions  . Pollen Extract Other (See Comments)    Runny nose, watery eyes, sneezing   Patient Measurements: Height: 5\' 10"  (177.8 cm) Weight: (!) 161.1 kg (355 lb 2.6 oz) IBW/kg (Calculated) : 73 HEPARIN DW (KG): 112.2  Vital Signs: Temp: 98 F (36.7 C) (05/24 0930) Temp Source: Oral (05/24 0930) BP: 118/69 (05/24 0930) Pulse Rate: 62 (05/24 0930)  Labs: Recent Labs    02/15/21 0428 02/15/21 1909 02/16/21 0413 02/16/21 1245 02/17/21 0457  HGB 13.0  --  12.5*  --  12.6*  HCT 41.1  --  39.0  --  38.7*  PLT 284  --  289  --  260  LABPROT 18.7*  --  17.2*  --  16.0*  INR 1.6*  --  1.4*  --  1.3*  HEPARINUNFRC  --  <0.10* 0.20* 0.22*  --   CREATININE 0.71  --  0.75  --  0.74   Estimated Creatinine Clearance: 180.3 mL/min (by C-G formula based on SCr of 0.74 mg/dL).  Medical History: Past Medical History:  Diagnosis Date  . Anxiety   . Aortic stenosis   . Cellulitis and abscess of lower extremity 06/11/2019  . Cellulitis of fourth toe of left foot   . Cholelithiasis   . Coronary artery disease    Nonobstructive CAD (40-50% LAD) 08/2019  . Depression   . Depression    Phreesia 09/27/2020  . Depression    Phreesia 11/01/2020  . Diabetes mellitus without complication (HCC)    Phreesia 09/27/2020  . Elevated troponin level not due myocardial infarction 11/11/2019  . Essential hypertension   . Gangrene of toe of left foot (HCC) 07/06/2019  . Heart murmur    Phreesia 09/27/2020  . Hyperlipidemia    Phreesia 09/27/2020  . Hypertension    Phreesia 09/27/2020  . Mixed hyperlipidemia   . Morbid obesity (HCC)   . S/P aortic valve replacement with mechanical valve 12/05/2019   25 mm Carbomedics top hat bileaflet mechanical valve via partial upper hemi-sternotomy  . Severe aortic stenosis 09/24/2019  . Type  2 diabetes mellitus (HCC)     Medications:  Medications Prior to Admission  Medication Sig Dispense Refill Last Dose  . atorvastatin (LIPITOR) 80 MG tablet Take 1 tablet (80 mg total) by mouth daily. 90 tablet 3 02/12/2021 at Unknown time  . insulin glargine (LANTUS SOLOSTAR) 100 UNIT/ML Solostar Pen Inject 30 Units into the skin at bedtime. 30 mL 3 02/11/2021 at Unknown time  . lisinopril (ZESTRIL) 20 MG tablet TAKE 1 Tablet BY MOUTH ONCE EVERY DAY 90 tablet 2 02/12/2021 at Unknown time  . metoprolol tartrate (LOPRESSOR) 25 MG tablet Take 1 tablet (25 mg total) by mouth 2 (two) times daily. 180 tablet 1 02/12/2021 at 01130  . sertraline (ZOLOFT) 50 MG tablet Take 1 tablet by mouth once daily 30 tablet 0 02/12/2021 at Unknown time  . sitaGLIPtin-metformin (JANUMET) 50-500 MG tablet Take 1 tablet by mouth 2 (two) times daily with a meal. 180 tablet 3 02/12/2021 at Unknown time  . warfarin (COUMADIN) 10 MG tablet Take 1 tablet daily except 1 1/2 tablets on Mondays, Wednesdays and Fridays or as directed 45 tablet 6 02/11/2021 at 1900    Assessment: 45 yo male with diabetic foot infection/abscess and on heparin. He is on warfarin PTA for a mechanical AVR.  Plans for possible surgical procedure (plans for  debridement and drainage of abscess)   Anticoagulation has been changed to Lovenox bridging while INR is subtherapeutic at 1.3. We will give another higher dose today.   Goal of Therapy:  Heparin level 0.3-0.7 units/ml Monitor platelets by anticoagulation protocol: Yes   Plan:  Lovenox 160mg  SQ BID Coumadin 15mg  x1  Daily INR  , PharmD, BCIDP, AAHIVP, CPP Infectious Disease Pharmacist 02/17/2021 9:41 AM   If dc home today, consider Lovenox 150mg  SQ BID and resume home dose coumadin 10mg  qday except 5 mg Tuesday. Check INR later this week.   02/19/2021, PharmD, BCIDP, AAHIVP, CPP Infectious Disease Pharmacist 02/17/2021 10:46 AM

## 2021-02-17 NOTE — Discharge Instructions (Signed)
Pack left diabetic foot wound with saline gauze followed by dry gauze, kerlix and 4" ace wrap daily.

## 2021-02-18 ENCOUNTER — Telehealth: Payer: Self-pay

## 2021-02-18 LAB — CULTURE, BLOOD (ROUTINE X 2)
Culture: NO GROWTH
Culture: NO GROWTH
Special Requests: ADEQUATE
Special Requests: ADEQUATE

## 2021-02-18 NOTE — Telephone Encounter (Signed)
Transition Care Management Follow-up Telephone Call  Date of discharge and from where: 02/17/21 [facility]   How have you been since you were released from the hospital? Good   Any questions or concerns? No  Items Reviewed:  Did the pt receive and understand the discharge instructions provided? Yes   Medications obtained and verified? Yes   Other? No   Any new allergies since your discharge? No   Dietary orders reviewed? Yes  Do you have support at home? Yes   Home Care and Equipment/Supplies: Were home health services ordered? no If so, what is the name of the agency? n/a  Has the agency set up a time to come to the patient's home? no Were any new equipment or medical supplies ordered?  No What is the name of the medical supply agency? n/a Were you able to get the supplies/equipment? not applicable Do you have any questions related to the use of the equipment or supplies? No  Functional Questionnaire: (I = Independent and D = Dependent) ADLs: i  Bathing/Dressing- i  Meal Prep- i  Eating- i  Maintaining continence- i  Transferring/Ambulation- i  Managing Meds- i  Follow up appointments reviewed:   PCP Hospital f/u appt confirmed? Yes  Scheduled to see Wallace Cullens on 03/02/21 @ 9:40am.  Specialist Hospital f/u appt confirmed? No  Scheduled to see  on [facility] they should call him today to schedule   Are transportation arrangements needed? No   If their condition worsens, is the pt aware to call PCP or go to the Emergency Dept.? Yes  Was the patient provided with contact information for the PCP's office or ED? Yes  Was to pt encouraged to call back with questions or concerns? Yes

## 2021-02-19 ENCOUNTER — Telehealth: Payer: Self-pay

## 2021-02-19 NOTE — Telephone Encounter (Signed)
Can you print him a note?

## 2021-02-19 NOTE — Telephone Encounter (Signed)
Patient recently discharged from [facility] he is calling requesting a note for work for the time he was admitted to when he has his follow up with Wallace Cullens on 03/02/21 ph# [phone]

## 2021-02-19 NOTE — Telephone Encounter (Signed)
Letter printed.

## 2021-02-25 ENCOUNTER — Ambulatory Visit (INDEPENDENT_AMBULATORY_CARE_PROVIDER_SITE_OTHER): Payer: 59 | Admitting: *Deleted

## 2021-02-25 DIAGNOSIS — Z5181 Encounter for therapeutic drug level monitoring: Secondary | ICD-10-CM | POA: Diagnosis not present

## 2021-02-25 DIAGNOSIS — Z952 Presence of prosthetic heart valve: Secondary | ICD-10-CM | POA: Diagnosis not present

## 2021-02-25 LAB — POCT INR: INR: 1.8 — AB (ref 2.0–3.0)

## 2021-02-25 NOTE — Patient Instructions (Signed)
Take warfarin 1 1/2 tablets tonight then increase dose to 1 tablet daily Continue Lovenox till finished.  (tomorrow) Recheck in 1 wk

## 2021-03-02 ENCOUNTER — Ambulatory Visit (INDEPENDENT_AMBULATORY_CARE_PROVIDER_SITE_OTHER): Payer: 59 | Admitting: Nurse Practitioner

## 2021-03-02 ENCOUNTER — Encounter: Payer: Self-pay | Admitting: Nurse Practitioner

## 2021-03-02 ENCOUNTER — Other Ambulatory Visit: Payer: Self-pay

## 2021-03-02 VITALS — BP 123/80 | HR 78 | Temp 98.5°F | Resp 18 | Ht 70.0 in | Wt 358.1 lb

## 2021-03-02 DIAGNOSIS — E11628 Type 2 diabetes mellitus with other skin complications: Secondary | ICD-10-CM | POA: Diagnosis not present

## 2021-03-02 DIAGNOSIS — L089 Local infection of the skin and subcutaneous tissue, unspecified: Secondary | ICD-10-CM

## 2021-03-02 NOTE — Patient Instructions (Signed)
We have an appointment set up for September. I will see you then unless you needs anything sooner.  I put in the referral to the wound clinic.

## 2021-03-02 NOTE — Assessment & Plan Note (Addendum)
-  hospitalized 5/19-5/24/22 -wound has improved greatly, no odor or purulent drainage -will refer to wound clinic to ensure optimal healing -will draw BMP and CBC today -he has upcoming appointment with Dr. Arbie Cookey -continue gauze dressing with kerlex wrap

## 2021-03-02 NOTE — Progress Notes (Signed)
Established Patient Office Visit  Subjective:  Patient ID: Adam Long, male    DOB: 1975-11-25  Age: 45 y.o. MRN: [id number]  CC:  Chief Complaint  Patient presents with  . Transitions Of Care    TOC pt had blister on his left foot had got infected but it is better since being out of the hospital discharged 5-24 [facility]     HPI Adam Long presents for [facility] visit.  He was admitted from 5/19-5/24/22 for left foot wound.  He has hx of morbid obesity and DM among other issues documented below.  He was referred to Dr. Donnetta Hutching with vascular surgery, and needs BMP and CBC today per hospital notes.  Past Medical History:  Diagnosis Date  . Anxiety   . Aortic stenosis   . Cellulitis and abscess of lower extremity 06/11/2019  . Cellulitis of fourth toe of left foot   . Cholelithiasis   . Coronary artery disease    Nonobstructive CAD (40-50% LAD) 08/2019  . Depression   . Depression    Phreesia 09/27/2020  . Depression    Phreesia 11/01/2020  . Diabetes mellitus without complication ([place])    Phreesia 09/27/2020  . Elevated troponin level not due myocardial infarction 11/11/2019  . Essential hypertension   . Gangrene of toe of left foot ([place]) 07/06/2019  . Heart murmur    Phreesia 09/27/2020  . Hyperlipidemia    Phreesia 09/27/2020  . Hypertension    Phreesia 09/27/2020  . Mixed hyperlipidemia   . Morbid obesity ([place])   . S/P aortic valve replacement with mechanical valve 12/05/2019   25 mm Carbomedics top hat bileaflet mechanical valve via partial upper hemi-sternotomy  . Severe aortic stenosis 09/24/2019  . Type 2 diabetes mellitus ([place])     Past Surgical History:  Procedure Laterality Date  . ABDOMINAL AORTOGRAM W/LOWER EXTREMITY N/A 07/06/2019   Procedure: ABDOMINAL AORTOGRAM W/LOWER EXTREMITY;  Surgeon: Elam Dutch, MD;  Location: [place] [facility];  Service: Cardiovascular;  Laterality: N/A;  Bilateral  . AMPUTATION Left 07/09/2019   Procedure: LEFT FOURTH  and Fifth TOE AMPUTATION.;  Surgeon: Rosetta Posner, MD;  Location: [place];  Service: Vascular;  Laterality: Left;  . AORTIC VALVE REPLACEMENT N/A 12/05/2019   Procedure: PARTIAL STERNOTOMY FOR AORTIC VALVE REPLACEMENT (AVR), USING CARBOMEDICS SUPRA-ANNULAR TOP HAT 25MM;  Surgeon: Rexene Alberts, MD;  Location: [place];  Service: Open Heart Surgery;  Laterality: N/A;  No neck lines on left  . CARDIAC VALVE REPLACEMENT N/A    Phreesia 09/27/2020  . IR RADIOLOGY PERIPHERAL GUIDED IV START  10/05/2019  . IR US GUIDE VASC ACCESS RIGHT  10/05/2019  . MULTIPLE EXTRACTIONS WITH ALVEOLOPLASTY N/A 10/26/2019   Procedure: EXTRACTION OF TOOTH #'S 3, 5-11,19-28,  AND 32 WITH ALVEOLOPLASTY;  Surgeon: Lenn Cal, DDS;  Location: [place];  Service: Oral Surgery;  Laterality: N/A;  . RIGHT HEART CATH AND CORONARY ANGIOGRAPHY N/A 09/24/2019   Procedure: RIGHT HEART CATH AND CORONARY ANGIOGRAPHY;  Surgeon: Sherren Mocha, MD;  Location: [place] [facility];  Service: Cardiovascular;  Laterality: N/A;  . TEE WITHOUT CARDIOVERSION N/A 12/05/2019   Procedure: TRANSESOPHAGEAL ECHOCARDIOGRAM (TEE);  Surgeon: Rexene Alberts, MD;  Location: [place];  Service: Open Heart Surgery;  Laterality: N/A;    Family History  Problem Relation Age of Onset  . Cancer Mother        Brain  . Heart disease Father   . Hyperlipidemia Father   .  Hypertension Father   . Stroke Father   . Heart murmur Sister     Social History   Socioeconomic History  . Marital status: Single    Spouse name: Not on file  . Number of children: Not on file  . Years of education: Not on file  . Highest education level: Not on file  Occupational History    Comment: Neurosurgeon- self-employed  Tobacco Use  . Smoking status: Never Smoker  . Smokeless tobacco: Former Systems developer    Types: Secondary school teacher  . Vaping Use: Never used  Substance and Sexual Activity  . Alcohol use: Not Currently    Comment: occasionally  . Drug use: No  . Sexual  activity: Yes    Birth control/protection: None  Other Topics Concern  . Not on file  Social History Narrative  . Not on file   Social Determinants of Health   Financial Resource Strain: Not on file  Food Insecurity: Not on file  Transportation Needs: Not on file  Physical Activity: Not on file  Stress: Not on file  Social Connections: Not on file  Intimate Partner Violence: Not on file    Outpatient Medications Prior to Visit  Medication Sig Dispense Refill  . atorvastatin (LIPITOR) 80 MG tablet Take 1 tablet (80 mg total) by mouth daily. 90 tablet 3  . insulin glargine (LANTUS SOLOSTAR) 100 UNIT/ML Solostar Pen Inject 30 Units into the skin at bedtime. 30 mL 3  . lisinopril (ZESTRIL) 20 MG tablet TAKE 1 Tablet BY MOUTH ONCE EVERY DAY 90 tablet 2  . metoprolol tartrate (LOPRESSOR) 25 MG tablet Take 1 tablet (25 mg total) by mouth 2 (two) times daily. 180 tablet 1  . sertraline (ZOLOFT) 50 MG tablet Take 1 tablet by mouth once daily 30 tablet 0  . sitaGLIPtin-metformin (JANUMET) 50-500 MG tablet Take 1 tablet by mouth 2 (two) times daily with a meal. 180 tablet 3  . warfarin (COUMADIN) 10 MG tablet Take 1 tablet daily, 1/2 tablet on Tuesdays only 45 tablet 1  . amoxicillin (AMOXIL) 500 MG capsule Take 2 capsules (1,000 mg total) by mouth daily at 4 PM. (Patient not taking: Reported on 03/02/2021) 10 capsule 0  . amoxicillin-clavulanate (AUGMENTIN) 875-125 MG tablet Take 1 tablet by mouth 2 (two) times daily. (Patient not taking: Reported on 03/02/2021) 20 tablet 0  . enoxaparin (LOVENOX) 100 mg/mL SOLN injection Inject 1.6 mLs (160 mg total) into the skin every 12 (twelve) hours. (Patient not taking: Reported on 03/02/2021) 14 each 0   No facility-administered medications prior to visit.    Allergies  Allergen Reactions  . Pollen Extract Other (See Comments)    Runny nose, watery eyes, sneezing    ROS Review of Systems  Constitutional: Negative.   Respiratory: Negative.    Cardiovascular: Negative.   Skin: Positive for wound.       Left foot wound      Objective:    Physical Exam Constitutional:      Appearance: Normal appearance. He is obese.  Cardiovascular:     Rate and Rhythm: Normal rate and regular rhythm.     Pulses: Normal pulses.     Heart sounds: Normal heart sounds.  Pulmonary:     Effort: Pulmonary effort is normal.     Breath sounds: Normal breath sounds.  Skin:    Comments: Left foot wound; open, size of quarter -has callous to peri-wound skin -wound bed is 50/50 pink and yellow  Neurological:     Mental Status: He is alert.     BP 123/80 (BP Location: Right Arm, Patient Position: Sitting, Cuff Size: Normal)   Pulse 78   Temp 98.5 F (36.9 C) (Oral)   Resp 18   Ht _0  (1.778 m)   Wt (!) 358 lb 1.9 oz (162.4 kg)   SpO2 98%   BMI 51.38 kg/m  Wt Readings from Last 3 Encounters:  03/02/21 (!) 358 lb 1.9 oz (162.4 kg)  02/16/21 (!) 355 lb 2.6 oz (161.1 kg)  02/02/21 (!) 358 lb (162.4 kg)     Health Maintenance Due  Topic Date Due  . Pneumococcal Vaccine 84-68 Years old (1 of 2 - PPSV23) Never done  . FOOT EXAM  07/01/2020    There are no preventive care reminders to display for this patient.  Lab Results  Component Value Date   TSH 2.25 06/13/2020   Lab Results  Component Value Date   WBC 9.3 02/17/2021   HGB 12.6 (L) 02/17/2021   HCT 38.7 (L) 02/17/2021   MCV 88.4 02/17/2021   PLT 260 02/17/2021   Lab Results  Component Value Date   NA 136 02/17/2021   K 4.2 02/17/2021   CO2 27 02/17/2021   GLUCOSE 111 (H) 02/17/2021   BUN 8 02/17/2021   CREATININE 0.74 02/17/2021   BILITOT 1.1 02/15/2021   ALKPHOS 54 02/15/2021   AST 22 02/15/2021   ALT 25 02/15/2021   PROT 7.5 02/15/2021   ALBUMIN 3.2 (L) 02/15/2021   CALCIUM 8.7 (L) 02/17/2021   ANIONGAP 5 02/17/2021   EGFR 113 02/02/2021   Lab Results  Component Value Date   CHOL 141 02/02/2021   Lab Results  Component Value Date   HDL 41  02/02/2021   Lab Results  Component Value Date   LDLCALC 76 02/02/2021   Lab Results  Component Value Date   TRIG 136 02/02/2021   Lab Results  Component Value Date   CHOLHDL 3.4 07/17/2020   Lab Results  Component Value Date   HGBA1C 6.5 (H) 02/02/2021      Assessment & Plan:   Problem List Items Addressed This Visit      Endocrine   Diabetic foot infection ([place]) - Primary    -hospitalized 5/19-5/24/22 -wound has improved greatly, no odor or purulent drainage -will refer to wound clinic to ensure optimal healing -will draw BMP and CBC today -he has upcoming appointment with Dr. Donnetta Hutching -continue gauze dressing with kerlex wrap      Relevant Orders   Basic metabolic panel   CBC with Differential/Platelet   Ambulatory referral to Wound Clinic      No orders of the defined types were placed in this encounter.   Follow-up: Return if symptoms worsen or fail to improve.    Noreene Larsson, NP

## 2021-03-03 LAB — BASIC METABOLIC PANEL
BUN/Creatinine Ratio: 16 (ref 9–20)
BUN: 13 mg/dL (ref 6–24)
CO2: 26 mmol/L (ref 20–29)
Calcium: 9.5 mg/dL (ref 8.7–10.2)
Chloride: 98 mmol/L (ref 96–106)
Creatinine, Ser: 0.8 mg/dL (ref 0.76–1.27)
Glucose: 133 mg/dL — ABNORMAL HIGH (ref 65–99)
Potassium: 5.2 mmol/L (ref 3.5–5.2)
Sodium: 137 mmol/L (ref 134–144)
eGFR: 112 mL/min/{1.73_m2} (ref >59)

## 2021-03-03 LAB — CBC WITH DIFFERENTIAL/PLATELET
Basophils Absolute: 0.1 10*3/uL (ref 0.0–0.2)
Basos: 1 %
EOS (ABSOLUTE): 0.1 10*3/uL (ref 0.0–0.4)
Eos: 1 %
Hematocrit: 39.6 % (ref 37.5–51.0)
Hemoglobin: 13.3 g/dL (ref 13.0–17.7)
Immature Grans (Abs): 0.1 10*3/uL (ref 0.0–0.1)
Immature Granulocytes: 1 %
Lymphocytes Absolute: 1.9 10*3/uL (ref 0.7–3.1)
Lymphs: 21 %
MCH: 28.5 pg (ref 26.6–33.0)
MCHC: 33.6 g/dL (ref 31.5–35.7)
MCV: 85 fL (ref 79–97)
Monocytes Absolute: 0.7 10*3/uL (ref 0.1–0.9)
Monocytes: 7 %
Neutrophils Absolute: 6.2 10*3/uL (ref 1.4–7.0)
Neutrophils: 69 %
Platelets: 246 10*3/uL (ref 150–450)
RBC: 4.66 x10E6/uL (ref 4.14–5.80)
RDW: 13.2 % (ref 11.6–15.4)
WBC: 9.1 10*3/uL (ref 3.4–10.8)

## 2021-03-03 NOTE — Progress Notes (Signed)
Labs look great.

## 2021-03-04 ENCOUNTER — Ambulatory Visit (INDEPENDENT_AMBULATORY_CARE_PROVIDER_SITE_OTHER): Payer: 59 | Admitting: *Deleted

## 2021-03-04 DIAGNOSIS — Z5181 Encounter for therapeutic drug level monitoring: Secondary | ICD-10-CM

## 2021-03-04 DIAGNOSIS — Z952 Presence of prosthetic heart valve: Secondary | ICD-10-CM

## 2021-03-04 LAB — POCT INR: INR: 2.2 (ref 2.0–3.0)

## 2021-03-04 NOTE — Patient Instructions (Addendum)
Continue warfarin 1 tablet daily Recheck in 3 wk

## 2021-03-05 ENCOUNTER — Encounter: Payer: Self-pay | Admitting: Orthopedic Surgery

## 2021-03-05 ENCOUNTER — Ambulatory Visit (INDEPENDENT_AMBULATORY_CARE_PROVIDER_SITE_OTHER): Payer: 59 | Admitting: Orthopedic Surgery

## 2021-03-05 ENCOUNTER — Other Ambulatory Visit: Payer: Self-pay

## 2021-03-05 DIAGNOSIS — M86272 Subacute osteomyelitis, left ankle and foot: Secondary | ICD-10-CM

## 2021-03-05 NOTE — Progress Notes (Signed)
Office Visit Note   Patient: Adam Long           Date of Birth: 16-Jun-1976           MRN: [id number] Visit Date: 03/05/2021              Requested by: Heather Roberts, NP [address]  [address] [place],  [place] [address] PCP: Heather Roberts, NP  Chief Complaint  Patient presents with   Left Foot - Follow-up      HPI: Patient is a 45 year old gentleman who was seen for initial evaluation for cellulitis ulceration osteomyelitis left foot.  Patient has received a vascular consult recently and ankle-brachial indices on May 23 showed good circulation.  Patient is status post amputation of the fourth and fifth toes in October 2020.  Patient has recently obtained an MRI scan of the left foot.  He has completed doxycycline and Augmentin for 10 days.  Past medical history positive for hypertension diabetes aortic valve replacement he is not on any blood thinners he is not smoking.  Patient's last hemoglobin A1c was 6.5 with an albumin of 3.2.  Assessment & Plan: Visit Diagnoses:  1. Subacute osteomyelitis, left ankle and foot (HCC)     Plan: Discussed with the patient with the ulceration necrotic ulcer and osteomyelitis of the fifth metatarsal head we would need to proceed with 1/5 and possibly fourth ray amputation.  Patient states he would like to proceed with surgery next week we will set this up at his convenience as outpatient surgery.  Follow-Up Instructions: Return in about 2 weeks (around 03/19/2021).   Ortho Exam  Patient is alert, oriented, no adenopathy, well-dressed, normal affect, normal respiratory effort. Examination patient has a palpable dorsalis pedis pulse no clinical arterial insufficiency he does have venous stasis swelling there is cellulitis around the lateral aspect of the left foot there is a necrotic ulcer that is 10 mm in diameter that probes down to bone the bone is visible and necrotic the wound bed is necrotic.  The cellulitis extends around the  fourth and fifth rays.  Patient states he was hospitalized about 2 weeks ago on IV antibiotics.  Imaging: No results found. No images are attached to the encounter.  Labs: Lab Results  Component Value Date   HGBA1C 6.5 (H) 02/02/2021   HGBA1C 6.0 12/17/2020   HGBA1C 6.0 (A) 06/19/2020   ESRSEDRATE 62 (H) 02/12/2021   CRP 5.8 (H) 02/15/2021   REPTSTATUS 02/18/2021 FINAL 02/12/2021   REPTSTATUS 02/18/2021 FINAL 02/12/2021   GRAMSTAIN  12/08/2015    ABUNDANT WBC PRESENT, PREDOMINANTLY PMN NO SQUAMOUS EPITHELIAL CELLS SEEN ABUNDANT GRAM POSITIVE COCCI IN CLUSTERS Performed at [facility]    CULT  02/12/2021    NO GROWTH 6 DAYS Performed at [facility], [address], [place], [place] [address]    CULT  02/12/2021    NO GROWTH 6 DAYS Performed at [facility], [address], [place], [place] [address]      Lab Results  Component Value Date   ALBUMIN 3.2 (L) 02/15/2021   ALBUMIN 3.0 (L) 02/13/2021   ALBUMIN 4.0 02/02/2021    Lab Results  Component Value Date   MG 1.8 02/13/2021   MG 2.1 12/06/2019   MG 2.4 12/06/2019   Lab Results  Component Value Date   VD25OH 38 06/13/2020    No results found for: PREALBUMIN CBC EXTENDED Latest Ref Rng & Units 03/02/2021 02/17/2021 02/16/2021  WBC 3.4 -  10.8 x10E3/uL 9.1 9.3 10.3  RBC 4.14 - 5.80 x10E6/uL 4.66 4.38 4.37  HGB 13.0 - 17.7 g/dL 92.3 12.6(L) 12.5(L)  HCT 37.5 - 51.0 % 39.6 38.7(L) 39.0  PLT 150 - 450 x10E3/uL 246 260 289  NEUTROABS 1.4 - 7.0 x10E3/uL 6.2 - -  LYMPHSABS 0.7 - 3.1 x10E3/uL 1.9 - -     There is no height or weight on file to calculate BMI.  Orders:  No orders of the defined types were placed in this encounter.  No orders of the defined types were placed in this encounter.    Procedures: No procedures performed  Clinical Data: No additional findings.  ROS:  All other systems negative, except as noted in the HPI. Review of Systems  Objective: Vital Signs: There were no  vitals taken for this visit.  Specialty Comments:  No specialty comments available.  PMFS History: Patient Active Problem List   Diagnosis Date Noted   Diabetic foot infection (HCC) 02/14/2021   Diabetic ulcer of left foot associated with type 1 diabetes mellitus, limited to breakdown of skin (HCC)    Cellulitis of left foot 02/13/2021   Leukocytosis 02/13/2021   Hyponatremia 02/13/2021   Normocytic anemia 02/13/2021   Lactic acidosis 02/13/2021   Hyperglycemia due to diabetes mellitus (HCC) 02/13/2021   Dehydration 02/13/2021   Open wound of left foot 02/12/2021   Left leg swelling 02/02/2021   Status post amputation of toe of left foot (HCC) 11/04/2020   Immunization due 11/04/2020   Anxiety 09/30/2020   Preventative health care 12/13/2019   S/P aortic valve replacement with mechanical valve 12/05/2019   S/P AVR (aortic valve replacement) 12/05/2019   Syncope 11/10/2019   Personal history of noncompliance with medical treatment, presenting hazards to health 03/16/2018   Uncontrolled type 2 diabetes mellitus with complication (HCC) 10/12/2015   Hyperlipidemia 10/12/2015   Morbid obesity (HCC) 10/12/2015   Chest pain 07/02/2013   HTN (hypertension) 07/02/2013   Past Medical History:  Diagnosis Date   Anxiety    Aortic stenosis    Cellulitis and abscess of lower extremity 06/11/2019   Cellulitis of fourth toe of left foot    Cholelithiasis    Coronary artery disease    Nonobstructive CAD (40-50% LAD) 08/2019   Depression    Depression    Phreesia 09/27/2020   Depression    Phreesia 11/01/2020   Diabetes mellitus without complication (HCC)    Phreesia 09/27/2020   Elevated troponin level not due myocardial infarction 11/11/2019   Essential hypertension    Gangrene of toe of left foot (HCC) 07/06/2019   Heart murmur    Phreesia 09/27/2020   Hyperlipidemia    Phreesia 09/27/2020   Hypertension    Phreesia 09/27/2020   Mixed hyperlipidemia    Morbid obesity (HCC)     S/P aortic valve replacement with mechanical valve 12/05/2019   25 mm Carbomedics top hat bileaflet mechanical valve via partial upper hemi-sternotomy   Severe aortic stenosis 09/24/2019   Type 2 diabetes mellitus (HCC)     Family History  Problem Relation Age of Onset   Cancer Mother        Brain   Heart disease Father    Hyperlipidemia Father    Hypertension Father    Stroke Father    Heart murmur Sister     Past Surgical History:  Procedure Laterality Date   ABDOMINAL AORTOGRAM W/LOWER EXTREMITY N/A 07/06/2019   Procedure: ABDOMINAL AORTOGRAM W/LOWER EXTREMITY;  Surgeon: Sherren Kerns,  MD;  Location: [facility] INVASIVE CV LAB;  Service: Cardiovascular;  Laterality: N/A;  Bilateral   AMPUTATION Left 07/09/2019   Procedure: LEFT FOURTH and Fifth TOE AMPUTATION.;  Surgeon: Larina Earthly, MD;  Location: [facility] OR;  Service: Vascular;  Laterality: Left;   AORTIC VALVE REPLACEMENT N/A 12/05/2019   Procedure: PARTIAL STERNOTOMY FOR AORTIC VALVE REPLACEMENT (AVR), USING CARBOMEDICS SUPRA-ANNULAR TOP HAT ;  Surgeon: Purcell Nails, MD;  Location: [facility] OR;  Service: Open Heart Surgery;  Laterality: N/A;  No neck lines on left   CARDIAC VALVE REPLACEMENT N/A    Phreesia 09/27/2020   IR RADIOLOGY PERIPHERAL GUIDED IV START  10/05/2019   IR US GUIDE VASC ACCESS RIGHT  10/05/2019   MULTIPLE EXTRACTIONS WITH ALVEOLOPLASTY N/A 10/26/2019   Procedure: EXTRACTION OF TOOTH #'S 3, 5-11,19-28,  AND 32 WITH ALVEOLOPLASTY;  Surgeon: Charlynne Pander, DDS;  Location: [facility] OR;  Service: Oral Surgery;  Laterality: N/A;   RIGHT HEART CATH AND CORONARY ANGIOGRAPHY N/A 09/24/2019   Procedure: RIGHT HEART CATH AND CORONARY ANGIOGRAPHY;  Surgeon: Tonny Bollman, MD;  Location: [facility] INVASIVE CV LAB;  Service: Cardiovascular;  Laterality: N/A;   TEE WITHOUT CARDIOVERSION N/A 12/05/2019   Procedure: TRANSESOPHAGEAL ECHOCARDIOGRAM (TEE);  Surgeon: Purcell Nails, MD;  Location: [facility] OR;  Service: Open Heart Surgery;   Laterality: N/A;   Social History   Occupational History    Comment: Glass blower/designer- self-employed  Tobacco Use   Smoking status: Never   Smokeless tobacco: Former    Types: Associate Professor Use: Never used  Substance and Sexual Activity   Alcohol use: Not Currently    Comment: occasionally   Drug use: No   Sexual activity: Yes    Birth control/protection: None

## 2021-03-09 ENCOUNTER — Other Ambulatory Visit: Payer: Self-pay | Admitting: Nurse Practitioner

## 2021-03-09 ENCOUNTER — Other Ambulatory Visit: Payer: Self-pay | Admitting: Physician Assistant

## 2021-03-09 DIAGNOSIS — F419 Anxiety disorder, unspecified: Secondary | ICD-10-CM

## 2021-03-10 ENCOUNTER — Other Ambulatory Visit: Payer: Self-pay

## 2021-03-10 ENCOUNTER — Encounter (HOSPITAL_COMMUNITY): Payer: Self-pay | Admitting: Orthopedic Surgery

## 2021-03-10 NOTE — Progress Notes (Addendum)
  Mr. Maysonet denies chest pain or shortness of breath.Patient denies s/s of Covid and has not been in contact with anyone who has to his knowledge.  Mr. Abebe has type 2 diabetes, patient reports that fasting CBGs run 120-130; last A1C  was 6.5 on 02/02/21.  I instructed Mr. Mcadory to not take Janumet tonight or in am and to decrease bedtime Lantus Insulin to 15 units. I instructed patient to check CBG after awaking and every 2 hours until arrival  to the hospital.  I Instructed patient if CBG is less than 70 to take 4 Glucose Tablets or 1 tube of Glucose Gel or 1/2 cup of a clear juice. Recheck CBG in 15 minutes if CBG is not over 70 call, pre- op desk at [phone] for further instructions.   Mr. Junkin stated that he was not given any instructions regarding Coumadin, patient stated that his My Chart post visit read that patient is not on blood thinner, this is patient's concern, did he not stop it because he didn't think I'm on it or is it because he doesn't stop it pre - op. Mr. Vorndran reported that he has tried to speak to a nurse at Dr. Audrie Lia office, but has not been able to. I told patient that I would call Elnita Maxwell, Dr. Audrie Lia scheduler and tell her, his concerns.  I left Elnita Maxwell a voice message.

## 2021-03-11 ENCOUNTER — Ambulatory Visit (HOSPITAL_COMMUNITY): Payer: 59 | Admitting: Certified Registered Nurse Anesthetist

## 2021-03-11 ENCOUNTER — Encounter (HOSPITAL_COMMUNITY)
Admission: RE | Disposition: A | Discharge status: Home / Self Care | Payer: Self-pay | Source: Home / Self Care | Attending: Orthopedic Surgery

## 2021-03-11 ENCOUNTER — Ambulatory Visit (HOSPITAL_COMMUNITY)
Admission: RE | Admit: 2021-03-11 | Discharge: 2021-03-11 | Disposition: A | Discharge status: Home / Self Care | Payer: 59 | Attending: Orthopedic Surgery | Admitting: Orthopedic Surgery

## 2021-03-11 ENCOUNTER — Encounter (HOSPITAL_COMMUNITY): Payer: Self-pay | Admitting: Orthopedic Surgery

## 2021-03-11 DIAGNOSIS — I2510 Atherosclerotic heart disease of native coronary artery without angina pectoris: Secondary | ICD-10-CM | POA: Diagnosis not present

## 2021-03-11 DIAGNOSIS — M86272 Subacute osteomyelitis, left ankle and foot: Secondary | ICD-10-CM | POA: Diagnosis not present

## 2021-03-11 DIAGNOSIS — E1169 Type 2 diabetes mellitus with other specified complication: Secondary | ICD-10-CM | POA: Insufficient documentation

## 2021-03-11 DIAGNOSIS — I10 Essential (primary) hypertension: Secondary | ICD-10-CM | POA: Insufficient documentation

## 2021-03-11 DIAGNOSIS — Z89422 Acquired absence of other left toe(s): Secondary | ICD-10-CM | POA: Diagnosis not present

## 2021-03-11 DIAGNOSIS — E782 Mixed hyperlipidemia: Secondary | ICD-10-CM | POA: Insufficient documentation

## 2021-03-11 DIAGNOSIS — Z6841 Body Mass Index (BMI) 40.0 and over, adult: Secondary | ICD-10-CM | POA: Insufficient documentation

## 2021-03-11 DIAGNOSIS — Z87891 Personal history of nicotine dependence: Secondary | ICD-10-CM | POA: Diagnosis not present

## 2021-03-11 DIAGNOSIS — Z952 Presence of prosthetic heart valve: Secondary | ICD-10-CM | POA: Diagnosis not present

## 2021-03-11 HISTORY — PX: AMPUTATION: SHX166

## 2021-03-11 LAB — GLUCOSE, CAPILLARY
Glucose-Capillary: 139 mg/dL — ABNORMAL HIGH (ref 70–99)
Glucose-Capillary: 115 mg/dL — ABNORMAL HIGH (ref 70–99)

## 2021-03-11 LAB — PROTIME-INR
INR: 1.8 — ABNORMAL HIGH (ref 0.8–1.2)
Prothrombin Time: 20.8 seconds — ABNORMAL HIGH (ref 11.4–15.2)

## 2021-03-11 LAB — APTT: aPTT: 38 seconds — ABNORMAL HIGH (ref 24–36)

## 2021-03-11 SURGERY — AMPUTATION, FOOT, RAY
Anesthesia: Monitor Anesthesia Care | Site: Foot | Laterality: Left

## 2021-03-11 MED ORDER — OXYCODONE-ACETAMINOPHEN 5-325 MG PO TABS: 1.0000 | ORAL_TABLET | ORAL | 0 refills | Status: DC | PRN

## 2021-03-11 MED ORDER — FENTANYL CITRATE (PF) 250 MCG/5ML IJ SOLN
INTRAMUSCULAR | Status: AC
  Filled 2021-03-11: qty 5

## 2021-03-11 MED ORDER — FENTANYL CITRATE (PF) 250 MCG/5ML IJ SOLN
INTRAMUSCULAR | Status: DC | PRN
  Administered 2021-03-11: 50 ug via INTRAVENOUS

## 2021-03-11 MED ORDER — ORAL CARE MOUTH RINSE: 15.0000 mL | Freq: Once | OROMUCOSAL | Status: AC

## 2021-03-11 MED ORDER — CEFAZOLIN IN SODIUM CHLORIDE 3-0.9 GM/100ML-% IV SOLN
3.0000 g | INTRAVENOUS | Status: AC
  Administered 2021-03-11: 3 g via INTRAVENOUS
  Filled 2021-03-11: qty 100

## 2021-03-11 MED ORDER — LIDOCAINE HCL 1 % IJ SOLN
INTRAMUSCULAR | Status: DC | PRN
  Administered 2021-03-11 (×2): 20 mL

## 2021-03-11 MED ORDER — PROPOFOL 10 MG/ML IV BOLUS
INTRAVENOUS | Status: DC | PRN
  Administered 2021-03-11: 20 mg via INTRAVENOUS
  Administered 2021-03-11: 200 mg via INTRAVENOUS

## 2021-03-11 MED ORDER — LIDOCAINE HCL (PF) 1 % IJ SOLN
INTRAMUSCULAR | Status: AC
  Filled 2021-03-11: qty 30

## 2021-03-11 MED ORDER — CHLORHEXIDINE GLUCONATE 0.12 % MT SOLN
15.0000 mL | Freq: Once | OROMUCOSAL | Status: AC
  Administered 2021-03-11: 15 mL via OROMUCOSAL
  Filled 2021-03-11: qty 15

## 2021-03-11 MED ORDER — ONDANSETRON HCL 4 MG/2ML IJ SOLN
INTRAMUSCULAR | Status: DC | PRN
  Administered 2021-03-11: 4 mg via INTRAVENOUS

## 2021-03-11 MED ORDER — PROPOFOL 500 MG/50ML IV EMUL
INTRAVENOUS | Status: DC | PRN
  Administered 2021-03-11: 75 ug/kg/min via INTRAVENOUS

## 2021-03-11 MED ORDER — FENTANYL CITRATE (PF) 100 MCG/2ML IJ SOLN: 25.0000 ug | INTRAMUSCULAR | Status: DC | PRN

## 2021-03-11 MED ORDER — MIDAZOLAM HCL 2 MG/2ML IJ SOLN
INTRAMUSCULAR | Status: AC
  Filled 2021-03-11: qty 2

## 2021-03-11 MED ORDER — LACTATED RINGERS IV SOLN: INTRAVENOUS | Status: DC

## 2021-03-11 MED ORDER — 0.9 % SODIUM CHLORIDE (POUR BTL) OPTIME
TOPICAL | Status: DC | PRN
  Administered 2021-03-11: 1000 mL

## 2021-03-11 MED ORDER — MIDAZOLAM HCL 2 MG/2ML IJ SOLN
INTRAMUSCULAR | Status: DC | PRN
  Administered 2021-03-11: 2 mg via INTRAVENOUS

## 2021-03-11 SURGICAL SUPPLY — 30 items
BLADE SAW SGTL MED 73X18.5 STR (BLADE) ×3 IMPLANT
BLADE SURG 21 STRL SS (BLADE) ×3 IMPLANT
BNDG COHESIVE 4X5 TAN STRL (GAUZE/BANDAGES/DRESSINGS) ×3 IMPLANT
BNDG COHESIVE 6X5 TAN NS LF (GAUZE/BANDAGES/DRESSINGS) ×3 IMPLANT
BNDG COHESIVE 6X5 TAN STRL LF (GAUZE/BANDAGES/DRESSINGS) ×3 IMPLANT
BNDG GAUZE ELAST 4 BULKY (GAUZE/BANDAGES/DRESSINGS) ×3 IMPLANT
COVER SURGICAL LIGHT HANDLE (MISCELLANEOUS) ×6 IMPLANT
COVER WAND RF STERILE (DRAPES) ×3 IMPLANT
DRAPE U-SHAPE 47X51 STRL (DRAPES) ×6 IMPLANT
DRSG ADAPTIC 3X8 NADH LF (GAUZE/BANDAGES/DRESSINGS) ×3 IMPLANT
DRSG PAD ABDOMINAL 8X10 ST (GAUZE/BANDAGES/DRESSINGS) ×3 IMPLANT
DURAPREP 26ML APPLICATOR (WOUND CARE) ×3 IMPLANT
ELECT REM PT RETURN 9FT ADLT (ELECTROSURGICAL) ×3
ELECTRODE REM PT RTRN 9FT ADLT (ELECTROSURGICAL) ×1 IMPLANT
GAUZE SPONGE 4X4 12PLY STRL (GAUZE/BANDAGES/DRESSINGS) ×3 IMPLANT
GLOVE BIOGEL PI IND STRL 9 (GLOVE) ×1 IMPLANT
GLOVE BIOGEL PI INDICATOR 9 (GLOVE) ×2
GLOVE SURG ORTHO 9.0 STRL STRW (GLOVE) ×3 IMPLANT
GOWN STRL REUS W/ TWL XL LVL3 (GOWN DISPOSABLE) ×2 IMPLANT
GOWN STRL REUS W/TWL XL LVL3 (GOWN DISPOSABLE) ×6
KIT BASIN OR (CUSTOM PROCEDURE TRAY) ×3 IMPLANT
KIT TURNOVER KIT B (KITS) ×3 IMPLANT
NS IRRIG 1000ML POUR BTL (IV SOLUTION) ×3 IMPLANT
PACK ORTHO EXTREMITY (CUSTOM PROCEDURE TRAY) ×3 IMPLANT
PAD ARMBOARD 7.5X6 YLW CONV (MISCELLANEOUS) ×6 IMPLANT
SUT ETHILON 2 0 PSLX (SUTURE) ×3 IMPLANT
TOWEL GREEN STERILE (TOWEL DISPOSABLE) ×3 IMPLANT
TUBE CONNECTING 12'X1/4 (SUCTIONS) ×1
TUBE CONNECTING 12X1/4 (SUCTIONS) ×2 IMPLANT
YANKAUER SUCT BULB TIP NO VENT (SUCTIONS) ×3 IMPLANT

## 2021-03-11 NOTE — Transfer of Care (Signed)
Immediate Anesthesia Transfer of Care Note  Patient: Adam Long  Procedure(s) Performed: LEFT FOOT 5TH  AND 4TH RAY AMPUTATION (Left: Foot)  Patient Location: PACU  Anesthesia Type:MAC  Level of Consciousness: awake, alert  and oriented  Airway & Oxygen Therapy: Patient Spontanous Breathing  Post-op Assessment: Report given to RN and Post -op Vital signs reviewed and stable  Post vital signs: Reviewed and stable  Last Vitals:  Vitals Value Taken Time  BP 122/75 03/11/21 1025  Temp 36.4 C 03/11/21 1010  Pulse 73 03/11/21 1033  Resp 21 03/11/21 1033  SpO2 100 % 03/11/21 1033  Vitals shown include unvalidated device data.  Last Pain:  Vitals:   03/11/21 1010  TempSrc:   PainSc: 0-No pain         Complications: No notable events documented.

## 2021-03-11 NOTE — Op Note (Signed)
03/11/2021  10:16 AM  PATIENT:  [name]    PRE-OPERATIVE DIAGNOSIS:  Osteomyelitis Left Foot  POST-OPERATIVE DIAGNOSIS:  Same  PROCEDURE:  LEFT FOOT 5TH  AND 4TH RAY AMPUTATION Local tissue rearrangement for wound closure 10 x 5 cm.  SURGEON:  Newt Minion, MD  PHYSICIAN ASSISTANT:None ANESTHESIA:   MAC  PREOPERATIVE INDICATIONS:  Adam Long is a  45 y.o. male with a diagnosis of Osteomyelitis Left Foot who failed conservative measures and elected for surgical management.    The risks benefits and alternatives were discussed with the patient preoperatively including but not limited to the risks of infection, bleeding, nerve injury, cardiopulmonary complications, the need for revision surgery, among others, and the patient was willing to proceed.  OPERATIVE IMPLANTS: none  _0 @  OPERATIVE FINDINGS: No deep abscess margins clear good petechial bleeding  OPERATIVE PROCEDURE: Patient was brought the operating room underwent a MAC anesthetic.  The left lower extremity was then prepped using DuraPrep draped into a sterile field a timeout was called.  Patient underwent a local block with 30 cc of 1% lidocaine plain and then interoperatively patient underwent a ankle block with an additional 20 cc of 1% lidocaine plain.  After adequate levels anesthesia were obtained a racquet incision was made around the ulcerative tissue laterally patient underwent a fourth and fifth ray amputation with an oscillating saw.  Electrocautery was used hemostasis wound was irrigated normal saline after resection of the ulcerative tissue and the metatarsals this left a wound that was 5 x 10 cm.  Local tissue rearrangement was used to close the wound with 2-0 nylon sterile dressing was applied patient was taken the PACU in stable condition a compression wrap was applied from the metatarsal heads to the tibial tubercle due to the significant venous stasis swelling.   DISCHARGE  PLANNING:  Antibiotic duration: Preoperative antibiotics  Weightbearing: Touchdown weightbearing on the left  Pain medication: Prescription for Percocet  Dressing care/ Wound VAC: Follow-up in office 1 week to change the dressing  Ambulatory devices: Crutches  Discharge to: Home.  Follow-up: In the office 1 week post operative.

## 2021-03-11 NOTE — Anesthesia Preprocedure Evaluation (Addendum)
Anesthesia Evaluation  Patient identified by MRN, date of birth, ID band Patient awake    Reviewed: Allergy & Precautions, NPO status , Patient's Chart, lab work & pertinent test results  Airway Mallampati: II  TM Distance: >3 FB     Dental   Pulmonary neg pulmonary ROS,    breath sounds clear to auscultation       Cardiovascular hypertension, + CAD   Rhythm:Regular Rate:Normal     Neuro/Psych    GI/Hepatic negative GI ROS, Neg liver ROS,   Endo/Other  diabetes  Renal/GU negative Renal ROS     Musculoskeletal   Abdominal   Peds  Hematology  (+) anemia ,   Anesthesia Other Findings   Reproductive/Obstetrics                             Anesthesia Physical Anesthesia Plan  ASA: 3  Anesthesia Plan: MAC   Post-op Pain Management:    Induction: Intravenous  PONV Risk Score and Plan: 2 and Propofol infusion  Airway Management Planned:   Additional Equipment:   Intra-op Plan:   Post-operative Plan: Extubation in OR  Informed Consent: I have reviewed the patients History and Physical, chart, labs and discussed the procedure including the risks, benefits and alternatives for the proposed anesthesia with the patient or authorized representative who has indicated his/her understanding and acceptance.     Dental advisory given  Plan Discussed with: CRNA and Anesthesiologist  Anesthesia Plan Comments:        Anesthesia Quick Evaluation

## 2021-03-11 NOTE — Anesthesia Postprocedure Evaluation (Signed)
Anesthesia Post Note  Patient: Adam Long  Procedure(s) Performed: LEFT FOOT 5TH  AND 4TH RAY AMPUTATION (Left: Foot)     Patient location during evaluation: PACU Anesthesia Type: MAC Level of consciousness: awake Pain management: pain level controlled Vital Signs Assessment: post-procedure vital signs reviewed and stable Respiratory status: spontaneous breathing Cardiovascular status: stable Postop Assessment: no apparent nausea or vomiting Anesthetic complications: no   No notable events documented.  Last Vitals:  Vitals:   03/11/21 1040 03/11/21 1055  BP: 111/71 125/77  Pulse: 71 73  Resp: 20 19  Temp:  36.7 C  SpO2: 100% 100%    Last Pain:  Vitals:   03/11/21 1055  TempSrc:   PainSc: 0-No pain                 Garrus Gauthreaux

## 2021-03-11 NOTE — Progress Notes (Signed)
Orthopedic Tech Progress Note Patient Details:  Adam Long October 14, 1975 [id number]  PACU RN called requesting a PAIR OF CRUTCHES  Ortho Devices Type of Ortho Device: Crutches Ortho Device/Splint Interventions: Application, Adjustment   Post Interventions Patient Tolerated: Ambulated well, Well Instructions Provided: Poper ambulation with device, Care of device, Adjustment of device  Donald Pore 03/11/2021, 11:46 AM

## 2021-03-11 NOTE — H&P (Addendum)
Adam Long is an 45 y.o. male.   Chief Complaint: left Foot osteomyelitis HPI: HPI: Patient is a 45 year old gentleman who was seen for initial evaluation for cellulitis ulceration osteomyelitis left foot.  Patient has received a vascular consult recently and ankle-brachial indices on May 23 showed good circulation.  Patient is status post amputation of the fourth and fifth toes in October 2020.  Patient has recently obtained an MRI scan of the left foot.  He has completed doxycycline and Augmentin for 10 days.  Past medical history positive for hypertension diabetes aortic valve replacement he is not on any blood thinners he is not smoking.  Patient's last hemoglobin A1c was 6.5 with an albumin of 3.2.  Past Medical History:  Diagnosis Date   Anxiety    Aortic stenosis    Cellulitis and abscess of lower extremity 06/11/2019   Cellulitis of fourth toe of left foot    Cholelithiasis    Coronary artery disease    Nonobstructive CAD (40-50% LAD) 08/2019   Depression    Depression    Phreesia 09/27/2020   Depression    Phreesia 11/01/2020   Diabetes mellitus without complication (HCC)    Phreesia 09/27/2020   Elevated troponin level not due myocardial infarction 11/11/2019   Essential hypertension    Gangrene of toe of left foot (HCC) 07/06/2019   Heart murmur    Phreesia 09/27/2020   Hyperlipidemia    Phreesia 09/27/2020   Hypertension    Phreesia 09/27/2020   Mixed hyperlipidemia    Morbid obesity (HCC)    S/P aortic valve replacement with mechanical valve 12/05/2019   25 mm Carbomedics top hat bileaflet mechanical valve via partial upper hemi-sternotomy   Severe aortic stenosis 09/24/2019   Type 2 diabetes mellitus (HCC)     Past Surgical History:  Procedure Laterality Date   ABDOMINAL AORTOGRAM W/LOWER EXTREMITY N/A 07/06/2019   Procedure: ABDOMINAL AORTOGRAM W/LOWER EXTREMITY;  Surgeon: Sherren Kerns, MD;  Location: [facility] INVASIVE CV LAB;  Service: Cardiovascular;   Laterality: N/A;  Bilateral   AMPUTATION Left 07/09/2019   Procedure: LEFT FOURTH and Fifth TOE AMPUTATION.;  Surgeon: Larina Earthly, MD;  Location: [facility] OR;  Service: Vascular;  Laterality: Left;   AORTIC VALVE REPLACEMENT N/A 12/05/2019   Procedure: PARTIAL STERNOTOMY FOR AORTIC VALVE REPLACEMENT (AVR), USING CARBOMEDICS SUPRA-ANNULAR TOP HAT ;  Surgeon: Purcell Nails, MD;  Location: [facility] OR;  Service: Open Heart Surgery;  Laterality: N/A;  No neck lines on left   CARDIAC VALVE REPLACEMENT N/A    Phreesia 09/27/2020   IR RADIOLOGY PERIPHERAL GUIDED IV START  10/05/2019   IR US GUIDE VASC ACCESS RIGHT  10/05/2019   MULTIPLE EXTRACTIONS WITH ALVEOLOPLASTY N/A 10/26/2019   Procedure: EXTRACTION OF TOOTH #'S 3, 5-11,19-28,  AND 32 WITH ALVEOLOPLASTY;  Surgeon: Charlynne Pander, DDS;  Location: [facility] OR;  Service: Oral Surgery;  Laterality: N/A;   RIGHT HEART CATH AND CORONARY ANGIOGRAPHY N/A 09/24/2019   Procedure: RIGHT HEART CATH AND CORONARY ANGIOGRAPHY;  Surgeon: Tonny Bollman, MD;  Location: [facility] INVASIVE CV LAB;  Service: Cardiovascular;  Laterality: N/A;   TEE WITHOUT CARDIOVERSION N/A 12/05/2019   Procedure: TRANSESOPHAGEAL ECHOCARDIOGRAM (TEE);  Surgeon: Purcell Nails, MD;  Location: [facility] OR;  Service: Open Heart Surgery;  Laterality: N/A;    Family History  Problem Relation Age of Onset   Cancer Mother        Brain   Heart disease Father    Hyperlipidemia Father  Hypertension Father    Stroke Father    Heart murmur Sister    Social History:  reports that he has never smoked. He quit smokeless tobacco use about 17 months ago.  His smokeless tobacco use included chew. He reports previous alcohol use. He reports that he does not use drugs.  Allergies:  Allergies  Allergen Reactions   Pollen Extract Other (See Comments)    Runny nose, watery eyes, sneezing    No medications prior to admission.    No results found for this or any previous visit (from the past 48 hour(s)). No  results found.  Review of Systems  All other systems reviewed and are negative.  There were no vitals taken for this visit. Physical Exam  Patient is alert, oriented, no adenopathy, well-dressed, normal affect, normal respiratory effort. Examination patient has a palpable dorsalis pedis pulse no clinical arterial insufficiency he does have venous stasis swelling there is cellulitis around the lateral aspect of the left foot there is a necrotic ulcer that is 10 mm in diameter that probes down to bone the bone is visible and necrotic the wound bed is necrotic.  The cellulitis extends around the fourth and fifth rays.  Patient states he was hospitalized about 2 weeks ago on IV antibiotics.Heart RRR Lungs Clear Assessment/Plan 1. Subacute osteomyelitis, left ankle and foot (HCC)       Plan: Discussed with the patient with the ulceration necrotic ulcer and osteomyelitis of the fifth metatarsal head we would need to proceed with fifth and possibly fourth ray amputation.  Patient states he would like to proceed with surgery next week we will set this up at his convenience as outpatient surgery.  [place] Persons, PA 03/11/2021, 6:26 AM

## 2021-03-11 NOTE — Anesthesia Postprocedure Evaluation (Signed)
Anesthesia Post Note  Patient: Adam Long  Procedure(s) Performed: LEFT FOOT 5TH  AND 4TH RAY AMPUTATION (Left: Foot)     Patient location during evaluation: PACU Anesthesia Type: MAC Level of consciousness: awake Pain management: pain level controlled Vital Signs Assessment: post-procedure vital signs reviewed and stable Respiratory status: spontaneous breathing Cardiovascular status: stable Postop Assessment: no apparent nausea or vomiting Anesthetic complications: no   No notable events documented.  Last Vitals:  Vitals:   03/11/21 1040 03/11/21 1055  BP: 111/71 125/77  Pulse: 71 73  Resp: 20 19  Temp:  36.7 C  SpO2: 100% 100%    Last Pain:  Vitals:   03/11/21 1055  TempSrc:   PainSc: 0-No pain                 Aliviana Burdell

## 2021-03-11 NOTE — Anesthesia Procedure Notes (Signed)
Procedure Name: MAC Date/Time: 03/11/2021 9:41 AM Performed by: Dorthea Cove, CRNA Pre-anesthesia Checklist: Patient identified, Emergency Drugs available, Suction available, Patient being monitored and Timeout performed Patient Re-evaluated:Patient Re-evaluated prior to induction Oxygen Delivery Method: Simple face mask Preoxygenation: Pre-oxygenation with 100% oxygen Induction Type: IV induction Placement Confirmation: positive ETCO2 Dental Injury: Teeth and Oropharynx as per pre-operative assessment

## 2021-03-12 ENCOUNTER — Encounter (HOSPITAL_COMMUNITY): Payer: Self-pay | Admitting: Orthopedic Surgery

## 2021-03-16 ENCOUNTER — Encounter: Payer: Self-pay | Admitting: Vascular Surgery

## 2021-03-16 ENCOUNTER — Ambulatory Visit (INDEPENDENT_AMBULATORY_CARE_PROVIDER_SITE_OTHER): Payer: 59 | Admitting: Vascular Surgery

## 2021-03-16 ENCOUNTER — Other Ambulatory Visit: Payer: Self-pay

## 2021-03-16 VITALS — BP 136/77 | HR 70 | Temp 98.1°F | Resp 16 | Ht 70.0 in | Wt 368.0 lb

## 2021-03-16 DIAGNOSIS — I96 Gangrene, not elsewhere classified: Secondary | ICD-10-CM

## 2021-03-16 NOTE — Progress Notes (Signed)
   [facility] [place]  Patient name: Adam Long MRN: [id number] DOB: 1976-03-11 Sex: male  REASON FOR VISIT: This visit had been scheduled and follow-up with hospital visit.  He subsequently underwent partial foot amputation with Dr. Lajoyce Corners.  He presents today with his prior scheduled ointment   Current Outpatient Medications  Medication Sig Dispense Refill   atorvastatin (LIPITOR) 80 MG tablet Take 1 tablet (80 mg total) by mouth daily. 90 tablet 3   insulin glargine (LANTUS SOLOSTAR) 100 UNIT/ML Solostar Pen Inject 30 Units into the skin at bedtime. 30 mL 3   lisinopril (ZESTRIL) 20 MG tablet TAKE 1 Tablet BY MOUTH ONCE EVERY DAY 90 tablet 2   metoprolol tartrate (LOPRESSOR) 25 MG tablet Take 1 tablet (25 mg total) by mouth 2 (two) times daily. 180 tablet 1   oxyCODONE-acetaminophen (PERCOCET) 5-325 MG tablet Take 1 tablet by mouth every 4 (four) hours as needed. 30 tablet 0   sertraline (ZOLOFT) 50 MG tablet Take 1 tablet by mouth once daily 90 tablet 1   sitaGLIPtin-metformin (JANUMET) 50-500 MG tablet Take 1 tablet by mouth 2 (two) times daily with a meal. 180 tablet 3   warfarin (COUMADIN) 10 MG tablet Take 1 tablet daily, 1/2 tablet on Tuesdays only 45 tablet 1   No current facility-administered medications for this visit.     PHYSICAL EXAM: Vitals:   03/16/21 1108  BP: 136/77  Pulse: 70  Resp: 16  Temp: 98.1 F (36.7 C)  TempSrc: Other (Comment)  SpO2: 99%  Weight: (!) 368 lb (166.9 kg)  Height: 5\' 10"  (1.778 m)    GENERAL: The patient is a well-nourished male, in no acute distress. The vital signs are documented above. Has a Coban dressing on his foot  MEDICAL ISSUES: Is good overall.  I explained to him that this visit was not scheduled prior to his partial foot amputation.  He will not be charged for this visit.  Will see as needed.  He will keep his follow-up with Dr.   [place], MD  [facility] [phone]  Note: Portions of this report may have been transcribed using voice recognition software.  Every effort has been made to ensure accuracy; however, inadvertent computerized transcription errors may still be present.

## 2021-03-18 ENCOUNTER — Encounter: Payer: Self-pay | Admitting: Family

## 2021-03-18 ENCOUNTER — Other Ambulatory Visit: Payer: Self-pay

## 2021-03-18 ENCOUNTER — Ambulatory Visit (INDEPENDENT_AMBULATORY_CARE_PROVIDER_SITE_OTHER): Payer: 59 | Admitting: Family

## 2021-03-18 DIAGNOSIS — Z89422 Acquired absence of other left toe(s): Secondary | ICD-10-CM

## 2021-03-18 NOTE — Progress Notes (Signed)
Post-Op Visit Note   Patient: Adam Long           Date of Birth: 11-11-1975           MRN: [id number] Visit Date: 03/18/2021 PCP: Adam Roberts, NP  Chief Complaint:  Chief Complaint  Patient presents with   Left Foot - Routine Post Op    03/11/21 left foot 4th and 5th ray amputation     HPI:  HPI Is a 45 year old gentleman seen status post left foot fourth and fifth ray amputations.  He is full weightbearing with crutches he does have some erythema over his foot he states this is normal for him.  No concerns of pain.  Ortho Exam On examination of the left foot the incision is approximated with sutures there is some slight gaping there is no probing there is granulation and the incision bed.  There is no cellulitis no warmth no active drainage Visit Diagnoses:  1. History of complete ray amputation of fifth toe of left foot (HCC)     Plan: Begin daily Dial soap cleansing.  Dry dressing changes.  Minimize weightbearing elevate for swelling follow-up in 2 weeks.  We will evaluate for suture removal at that time  Follow-Up Instructions: Return in about 2 weeks (around 04/01/2021).   Imaging: No results found.  Orders:  No orders of the defined types were placed in this encounter.  No orders of the defined types were placed in this encounter.    PMFS History: Patient Active Problem List   Diagnosis Date Noted   Subacute osteomyelitis, left ankle and foot (HCC)    Diabetic foot infection (HCC) 02/14/2021   Diabetic ulcer of left foot associated with type 1 diabetes mellitus, limited to breakdown of skin (HCC)    Cellulitis of left foot 02/13/2021   Leukocytosis 02/13/2021   Hyponatremia 02/13/2021   Normocytic anemia 02/13/2021   Lactic acidosis 02/13/2021   Hyperglycemia due to diabetes mellitus (HCC) 02/13/2021   Dehydration 02/13/2021   Open wound of left foot 02/12/2021   Left leg swelling 02/02/2021   History of complete ray amputation of fifth toe of left  foot (HCC) 11/04/2020   Immunization due 11/04/2020   Anxiety 09/30/2020   Preventative health care 12/13/2019   S/P aortic valve replacement with mechanical valve 12/05/2019   S/P AVR (aortic valve replacement) 12/05/2019   Syncope 11/10/2019   Personal history of noncompliance with medical treatment, presenting hazards to health 03/16/2018   Uncontrolled type 2 diabetes mellitus with complication (HCC) 10/12/2015   Hyperlipidemia 10/12/2015   Morbid obesity (HCC) 10/12/2015   Chest pain 07/02/2013   HTN (hypertension) 07/02/2013   Past Medical History:  Diagnosis Date   Anxiety    Aortic stenosis    Cellulitis and abscess of lower extremity 06/11/2019   Cellulitis of fourth toe of left foot    Cholelithiasis    Coronary artery disease    Nonobstructive CAD (40-50% LAD) 08/2019   Depression    Depression    Phreesia 09/27/2020   Depression    Phreesia 11/01/2020   Diabetes mellitus without complication (HCC)    Phreesia 09/27/2020   Elevated troponin level not due myocardial infarction 11/11/2019   Essential hypertension    Gangrene of toe of left foot (HCC) 07/06/2019   Heart murmur    Phreesia 09/27/2020   Hyperlipidemia    Phreesia 09/27/2020   Hypertension    Phreesia 09/27/2020   Mixed hyperlipidemia    Morbid obesity (HCC)  S/P aortic valve replacement with mechanical valve 12/05/2019   25 mm Carbomedics top hat bileaflet mechanical valve via partial upper hemi-sternotomy   Severe aortic stenosis 09/24/2019   Type 2 diabetes mellitus (HCC)     Family History  Problem Relation Age of Onset   Cancer Mother        Brain   Heart disease Father    Hyperlipidemia Father    Hypertension Father    Stroke Father    Heart murmur Sister     Past Surgical History:  Procedure Laterality Date   ABDOMINAL AORTOGRAM W/LOWER EXTREMITY N/A 07/06/2019   Procedure: ABDOMINAL AORTOGRAM W/LOWER EXTREMITY;  Surgeon: Sherren Kerns, MD;  Location: [facility] IN[facility]AS[facility];   Service: Cardiovascular;  Laterality: N/A;  Bilateral   AMPUTATION Left 07/09/2019   Procedure: LEFT FOURTH and Fifth TOE AMPUTATION.;  Surgeon: Larina Earthly, MD;  Location: [facility] OR;  Service: Vascular;  Laterality: Left;   AMPUTATION Left 03/11/2021   Procedure: LEFT FOOT 5TH  AND 4TH RAY AMPUTATION;  Surgeon: Nadara Mustard, MD;  Location: [facility] OR;  Service: Orthopedics;  Laterality: Left;   AORTIC VALVE REPLACEMENT N/A 12/05/2019   Procedure: PARTIAL STERNOTOMY FOR AORTIC VALVE REPLACEMENT (AVR), USING CARBOMEDICS SUPRA-ANNULAR TOP HAT ;  Surgeon: Purcell Nails, MD;  Location: [facility] OR;  Service: Open Heart Surgery;  Laterality: N/A;  No neck lines on left   CARDIAC VALVE REPLACEMENT N/A    Phreesia 09/27/2020   IR RADIOLOGY PERIPHERAL GUIDED IV START  10/05/2019   IR US GUIDE VASC ACCESS RIGHT  10/05/2019   MULTIPLE EXTRACTIONS WITH ALVEOLOPLASTY N/A 10/26/2019   Procedure: EXTRACTION OF TOOTH #'S 3, 5-11,19-28,  AND 32 WITH ALVEOLOPLASTY;  Surgeon: Charlynne Pander, DDS;  Location: [facility] OR;  Service: Oral Surgery;  Laterality: N/A;   RIGHT HEART CATH AND CORONARY ANGIOGRAPHY N/A 09/24/2019   Procedure: RIGHT HEART CATH AND CORONARY ANGIOGRAPHY;  Surgeon: Tonny Bollman, MD;  Location: [facility] INVASIVE CV LAB;  Service: Cardiovascular;  Laterality: N/A;   TEE WITHOUT CARDIOVERSION N/A 12/05/2019   Procedure: TRANSESOPHAGEAL ECHOCARDIOGRAM (TEE);  Surgeon: Purcell Nails, MD;  Location: [facility] OR;  Service: Open Heart Surgery;  Laterality: N/A;   Social History   Occupational History    Comment: Glass blower/designer- self-employed  Tobacco Use   Smoking status: Never   Smokeless tobacco: Former    Types: Chew    Quit date: 2021  Vaping Use   Vaping Use: Never used  Substance and Sexual Activity   Alcohol use: Not Currently    Comment: occasionally   Drug use: No   Sexual activity: Yes    Birth control/protection: None

## 2021-03-25 ENCOUNTER — Ambulatory Visit (INDEPENDENT_AMBULATORY_CARE_PROVIDER_SITE_OTHER): Payer: 59 | Admitting: *Deleted

## 2021-03-25 ENCOUNTER — Encounter (INDEPENDENT_AMBULATORY_CARE_PROVIDER_SITE_OTHER): Payer: Self-pay

## 2021-03-25 ENCOUNTER — Telehealth: Payer: Self-pay | Admitting: Family

## 2021-03-25 DIAGNOSIS — Z5181 Encounter for therapeutic drug level monitoring: Secondary | ICD-10-CM

## 2021-03-25 DIAGNOSIS — Z952 Presence of prosthetic heart valve: Secondary | ICD-10-CM | POA: Diagnosis not present

## 2021-03-25 LAB — POCT INR: INR: 2.5 (ref 2.0–3.0)

## 2021-03-25 NOTE — Telephone Encounter (Signed)
Pt called wanting to know when he can return to work and if he can receive a work note with that date on it. The best call back number is [phone].

## 2021-03-25 NOTE — Patient Instructions (Signed)
Continue warfarin 1 tablet daily  Recheck in 4 wk 

## 2021-03-26 NOTE — Telephone Encounter (Signed)
I called pt and advised that as of his last visit he needed to minimize weight bearing and elevate his foot. He has a follo wup appt on 04/01/21 advised lets wait until this appt to eval  the surgical site and see what that return to work time line looks like and will give a note at that time. Pt happy with this plan and will call with any other questions.

## 2021-03-30 ENCOUNTER — Encounter (INDEPENDENT_AMBULATORY_CARE_PROVIDER_SITE_OTHER): Payer: Self-pay

## 2021-04-01 ENCOUNTER — Encounter: Payer: Self-pay | Admitting: Family

## 2021-04-01 ENCOUNTER — Ambulatory Visit (INDEPENDENT_AMBULATORY_CARE_PROVIDER_SITE_OTHER): Payer: 59 | Admitting: Family

## 2021-04-01 DIAGNOSIS — Z89422 Acquired absence of other left toe(s): Secondary | ICD-10-CM

## 2021-04-01 MED ORDER — CEPHALEXIN 500 MG PO CAPS: 500.0000 mg | ORAL_CAPSULE | Freq: Three times a day (TID) | ORAL | 0 refills | Status: DC

## 2021-04-01 NOTE — Progress Notes (Signed)
Post-Op Visit Note   Patient: Adam Long           Date of Birth: 15-Jul-1976           MRN: [id number] Visit Date: 04/01/2021 PCP: Heather Roberts, NP  Chief Complaint: No chief complaint on file.   HPI:  HPI The patient is a 45 year old gentleman seen status post fifth ray amputation on the left he did have some issues with dehiscence.  He is full weightbearing with crutches and a postop shoe doing dry dressing changes.  Ortho Exam On examination of the left foot he does have some mild dehiscence of the about the center of his incision this is open a length of 2 cm about 6 mm wide there is necrotic tissue in the wound bed this does not probe.  There is mild surrounding erythema very mild warmth no ascending cellulitis.  Over the dorsum of his foot it appears to be resolving cellulitis there is a foul odor  Visit Diagnoses:  1. History of complete ray amputation of fifth toe of left foot (HCC)     Plan: We will place him on a course of Keflex.  He will continue with daily Dial soap cleansing dry dressing changes packed the wound open minimize weightbearing follow-up in about 2 weeks discussed return precautions.  Sutures harvested today  Follow-Up Instructions: Return in about 2 weeks (around 04/15/2021).   Imaging: No results found.  Orders:  No orders of the defined types were placed in this encounter.  Meds ordered this encounter  Medications   DISCONTD: cephALEXin (KEFLEX) 500 MG capsule    Sig: Take 1 capsule (500 mg total) by mouth 3 (three) times daily.    Dispense:  30 capsule    Refill:  0   cephALEXin (KEFLEX) 500 MG capsule    Sig: Take 1 capsule (500 mg total) by mouth 3 (three) times daily.    Dispense:  30 capsule    Refill:  0     PMFS History: Patient Active Problem List   Diagnosis Date Noted   Subacute osteomyelitis, left ankle and foot (HCC)    Diabetic foot infection (HCC) 02/14/2021   Diabetic ulcer of left foot associated with type 1  diabetes mellitus, limited to breakdown of skin (HCC)    Cellulitis of left foot 02/13/2021   Leukocytosis 02/13/2021   Hyponatremia 02/13/2021   Normocytic anemia 02/13/2021   Lactic acidosis 02/13/2021   Hyperglycemia due to diabetes mellitus (HCC) 02/13/2021   Dehydration 02/13/2021   Open wound of left foot 02/12/2021   Left leg swelling 02/02/2021   History of complete ray amputation of fifth toe of left foot (HCC) 11/04/2020   Immunization due 11/04/2020   Anxiety 09/30/2020   Preventative health care 12/13/2019   S/P aortic valve replacement with mechanical valve 12/05/2019   S/P AVR (aortic valve replacement) 12/05/2019   Syncope 11/10/2019   Personal history of noncompliance with medical treatment, presenting hazards to health 03/16/2018   Uncontrolled type 2 diabetes mellitus with complication (HCC) 10/12/2015   Hyperlipidemia 10/12/2015   Morbid obesity (HCC) 10/12/2015   Chest pain 07/02/2013   HTN (hypertension) 07/02/2013   Past Medical History:  Diagnosis Date   Anxiety    Aortic stenosis    Cellulitis and abscess of lower extremity 06/11/2019   Cellulitis of fourth toe of left foot    Cholelithiasis    Coronary artery disease    Nonobstructive CAD (40-50% LAD) 08/2019   Depression  Depression    Phreesia 09/27/2020   Depression    Phreesia 11/01/2020   Diabetes mellitus without complication (HCC)    Phreesia 09/27/2020   Elevated troponin level not due myocardial infarction 11/11/2019   Essential hypertension    Gangrene of toe of left foot (HCC) 07/06/2019   Heart murmur    Phreesia 09/27/2020   Hyperlipidemia    Phreesia 09/27/2020   Hypertension    Phreesia 09/27/2020   Mixed hyperlipidemia    Morbid obesity (HCC)    S/P aortic valve replacement with mechanical valve 12/05/2019   25 mm Carbomedics top hat bileaflet mechanical valve via partial upper hemi-sternotomy   Severe aortic stenosis 09/24/2019   Type 2 diabetes mellitus (HCC)     Family  History  Problem Relation Age of Onset   Cancer Mother        Brain   Heart disease Father    Hyperlipidemia Father    Hypertension Father    Stroke Father    Heart murmur Sister     Past Surgical History:  Procedure Laterality Date   ABDOMINAL AORTOGRAM W/LOWER EXTREMITY N/A 07/06/2019   Procedure: ABDOMINAL AORTOGRAM W/LOWER EXTREMITY;  Surgeon: Sherren Kerns, MD;  Location: [facility];  Service: Cardiovascular;  Laterality: N/A;  Bilateral   AMPUTATION Left 07/09/2019   Procedure: LEFT FOURTH and Fifth TOE AMPUTATION.;  Surgeon: Larina Earthly, MD;  Location: [facility] OR;  Service: Vascular;  Laterality: Left;   AMPUTATION Left 03/11/2021   Procedure: LEFT FOOT 5TH  AND 4TH RAY AMPUTATION;  Surgeon: Nadara Mustard, MD;  Location: [facility] OR;  Service: Orthopedics;  Laterality: Left;   AORTIC VALVE REPLACEMENT N/A 12/05/2019   Procedure: PARTIAL STERNOTOMY FOR AORTIC VALVE REPLACEMENT (AVR), USING CARBOMEDICS SUPRA-ANNULAR TOP HAT ;  Surgeon: Purcell Nails, MD;  Location: [facility] OR;  Service: Open Heart Surgery;  Laterality: N/A;  No neck lines on left   CARDIAC VALVE REPLACEMENT N/A    Phreesia 09/27/2020   IR RADIOLOGY PERIPHERAL GUIDED IV START  10/05/2019   IR US GUIDE VASC ACCESS RIGHT  10/05/2019   MULTIPLE EXTRACTIONS WITH ALVEOLOPLASTY N/A 10/26/2019   Procedure: EXTRACTION OF TOOTH #'S 3, 5-11,19-28,  AND 32 WITH ALVEOLOPLASTY;  Surgeon: Charlynne Pander, DDS;  Location: [facility] OR;  Service: Oral Surgery;  Laterality: N/A;   RIGHT HEART CATH AND CORONARY ANGIOGRAPHY N/A 09/24/2019   Procedure: RIGHT HEART CATH AND CORONARY ANGIOGRAPHY;  Surgeon: Tonny Bollman, MD;  Location: [facility] INVASIVE CV LAB;  Service: Cardiovascular;  Laterality: N/A;   TEE WITHOUT CARDIOVERSION N/A 12/05/2019   Procedure: TRANSESOPHAGEAL ECHOCARDIOGRAM (TEE);  Surgeon: Purcell Nails, MD;  Location: [facility] OR;  Service: Open Heart Surgery;  Laterality: N/A;   Social History   Occupational History    Comment:  Glass blower/designer- self-employed  Tobacco Use   Smoking status: Never   Smokeless tobacco: Former    Types: Chew    Quit date: 2021  Vaping Use   Vaping Use: Never used  Substance and Sexual Activity   Alcohol use: Not Currently    Comment: occasionally   Drug use: No   Sexual activity: Yes    Birth control/protection: None

## 2021-04-15 ENCOUNTER — Ambulatory Visit (INDEPENDENT_AMBULATORY_CARE_PROVIDER_SITE_OTHER): Payer: 59 | Admitting: Family

## 2021-04-15 ENCOUNTER — Encounter: Payer: Self-pay | Admitting: Family

## 2021-04-15 DIAGNOSIS — Z89422 Acquired absence of other left toe(s): Secondary | ICD-10-CM

## 2021-04-15 NOTE — Progress Notes (Signed)
Post-Op Visit Note   Patient: Adam Long           Date of Birth: May 02, 1976           MRN: [id number] Visit Date: 04/15/2021 PCP: Heather Roberts, NP  Chief Complaint: No chief complaint on file.   HPI:  HPI The patient is a 45 year old gentleman seen status post fifth ray amputation on the left he did have some issues with dehiscence.  He is full weightbearing with a postop shoe doing dry dressing changes.  Is out of work  Ortho Exam On examination of the left foot there is marked improvement in his incision now incision is well healed sans a central area that is about 15 mm in length open 5 mm with.  There is pink tissue in the wound bed there is no probing no surrounding erythema no drainage no warmth no sign of infection   Visit Diagnoses:  No diagnosis found.   Plan: He has completed his Keflex.  Have written a note for him to continue out of work.  He will begin packing the wound open with a bit of Prisma.  He will follow-up in 2 weeks    Follow-Up Instructions: Return in about 2 weeks (around 04/29/2021).   Imaging: No results found.  Orders:  No orders of the defined types were placed in this encounter.  No orders of the defined types were placed in this encounter.    PMFS History: Patient Active Problem List   Diagnosis Date Noted   Subacute osteomyelitis, left ankle and foot (HCC)    Diabetic foot infection (HCC) 02/14/2021   Diabetic ulcer of left foot associated with type 1 diabetes mellitus, limited to breakdown of skin (HCC)    Cellulitis of left foot 02/13/2021   Leukocytosis 02/13/2021   Hyponatremia 02/13/2021   Normocytic anemia 02/13/2021   Lactic acidosis 02/13/2021   Hyperglycemia due to diabetes mellitus (HCC) 02/13/2021   Dehydration 02/13/2021   Open wound of left foot 02/12/2021   Left leg swelling 02/02/2021   History of complete ray amputation of fifth toe of left foot (HCC) 11/04/2020   Immunization due 11/04/2020   Anxiety  09/30/2020   Preventative health care 12/13/2019   S/P aortic valve replacement with mechanical valve 12/05/2019   S/P AVR (aortic valve replacement) 12/05/2019   Syncope 11/10/2019   Personal history of noncompliance with medical treatment, presenting hazards to health 03/16/2018   Uncontrolled type 2 diabetes mellitus with complication (HCC) 10/12/2015   Hyperlipidemia 10/12/2015   Morbid obesity (HCC) 10/12/2015   Chest pain 07/02/2013   HTN (hypertension) 07/02/2013   Past Medical History:  Diagnosis Date   Anxiety    Aortic stenosis    Cellulitis and abscess of lower extremity 06/11/2019   Cellulitis of fourth toe of left foot    Cholelithiasis    Coronary artery disease    Nonobstructive CAD (40-50% LAD) 08/2019   Depression    Depression    Phreesia 09/27/2020   Depression    Phreesia 11/01/2020   Diabetes mellitus without complication (HCC)    Phreesia 09/27/2020   Elevated troponin level not due myocardial infarction 11/11/2019   Essential hypertension    Gangrene of toe of left foot (HCC) 07/06/2019   Heart murmur    Phreesia 09/27/2020   Hyperlipidemia    Phreesia 09/27/2020   Hypertension    Phreesia 09/27/2020   Mixed hyperlipidemia    Morbid obesity (HCC)    S/P aortic  valve replacement with mechanical valve 12/05/2019   25 mm Carbomedics top hat bileaflet mechanical valve via partial upper hemi-sternotomy   Severe aortic stenosis 09/24/2019   Type 2 diabetes mellitus (HCC)     Family History  Problem Relation Age of Onset   Cancer Mother        Brain   Heart disease Father    Hyperlipidemia Father    Hypertension Father    Stroke Father    Heart murmur Sister     Past Surgical History:  Procedure Laterality Date   ABDOMINAL AORTOGRAM W/LOWER EXTREMITY N/A 07/06/2019   Procedure: ABDOMINAL AORTOGRAM W/LOWER EXTREMITY;  Surgeon: Sherren Kerns, MD;  Location: [facility] INVAS[facility] CV [facility];  Service: Cardiovascular;  Laterality: N/A;  Bilateral   AMPUTATION  Left 07/09/2019   Procedure: LEFT FOURTH and Fifth TOE AMPUTATION.;  Surgeon: Larina Earthly, MD;  Location: [facility] OR;  Service: Vascular;  Laterality: Left;   AMPUTATION Left 03/11/2021   Procedure: LEFT FOOT 5TH  AND 4TH RAY AMPUTATION;  Surgeon: Nadara Mustard, MD;  Location: [facility] OR;  Service: Orthopedics;  Laterality: Left;   AORTIC VALVE REPLACEMENT N/A 12/05/2019   Procedure: PARTIAL STERNOTOMY FOR AORTIC VALVE REPLACEMENT (AVR), USING CARBOMEDICS SUPRA-ANNULAR TOP HAT ;  Surgeon: Purcell Nails, MD;  Location: [facility] OR;  Service: Open Heart Surgery;  Laterality: N/A;  No neck lines on left   CARDIAC VALVE REPLACEMENT N/A    Phreesia 09/27/2020   IR RADIOLOGY PERIPHERAL GUIDED IV START  10/05/2019   IR US GUIDE VASC ACCESS RIGHT  10/05/2019   MULTIPLE EXTRACTIONS WITH ALVEOLOPLASTY N/A 10/26/2019   Procedure: EXTRACTION OF TOOTH #'S 3, 5-11,19-28,  AND 32 WITH ALVEOLOPLASTY;  Surgeon: Charlynne Pander, DDS;  Location: [facility] OR;  Service: Oral Surgery;  Laterality: N/A;   RIGHT HEART CATH AND CORONARY ANGIOGRAPHY N/A 09/24/2019   Procedure: RIGHT HEART CATH AND CORONARY ANGIOGRAPHY;  Surgeon: Tonny Bollman, MD;  Location: [facility] INVASIVE CV LAB;  Service: Cardiovascular;  Laterality: N/A;   TEE WITHOUT CARDIOVERSION N/A 12/05/2019   Procedure: TRANSESOPHAGEAL ECHOCARDIOGRAM (TEE);  Surgeon: Purcell Nails, MD;  Location: [facility] OR;  Service: Open Heart Surgery;  Laterality: N/A;   Social History   Occupational History    Comment: Glass blower/designer- self-employed  Tobacco Use   Smoking status: Never   Smokeless tobacco: Former    Types: Chew    Quit date: 2021  Vaping Use   Vaping Use: Never used  Substance and Sexual Activity   Alcohol use: Not Currently    Comment: occasionally   Drug use: No   Sexual activity: Yes    Birth control/protection: None

## 2021-04-23 ENCOUNTER — Ambulatory Visit (INDEPENDENT_AMBULATORY_CARE_PROVIDER_SITE_OTHER): Payer: 59 | Admitting: *Deleted

## 2021-04-23 ENCOUNTER — Other Ambulatory Visit: Payer: Self-pay

## 2021-04-23 DIAGNOSIS — Z5181 Encounter for therapeutic drug level monitoring: Secondary | ICD-10-CM

## 2021-04-23 DIAGNOSIS — Z952 Presence of prosthetic heart valve: Secondary | ICD-10-CM

## 2021-04-23 LAB — POCT INR: INR: 2.3 (ref 2.0–3.0)

## 2021-04-23 NOTE — Patient Instructions (Signed)
Continue warfarin 1 tablet daily Recheck in 6 wk

## 2021-04-29 ENCOUNTER — Encounter: Payer: 59 | Admitting: Physician Assistant

## 2021-05-01 ENCOUNTER — Encounter: Payer: 59 | Admitting: Physician Assistant

## 2021-05-13 ENCOUNTER — Ambulatory Visit (INDEPENDENT_AMBULATORY_CARE_PROVIDER_SITE_OTHER): Payer: 59

## 2021-05-13 ENCOUNTER — Ambulatory Visit (INDEPENDENT_AMBULATORY_CARE_PROVIDER_SITE_OTHER): Payer: 59 | Admitting: Physician Assistant

## 2021-05-13 ENCOUNTER — Encounter: Payer: Self-pay | Admitting: Physician Assistant

## 2021-05-13 ENCOUNTER — Other Ambulatory Visit: Payer: Self-pay

## 2021-05-13 DIAGNOSIS — M86272 Subacute osteomyelitis, left ankle and foot: Secondary | ICD-10-CM

## 2021-05-13 DIAGNOSIS — Z89422 Acquired absence of other left toe(s): Secondary | ICD-10-CM | POA: Diagnosis not present

## 2021-05-13 NOTE — Progress Notes (Signed)
Post-Op Visit Note   Patient: Adam Long           Date of Birth: [birth year]           MRN: [id number] Visit Date: 05/13/2021 PCP: Heather Roberts, NP  Chief Complaint: No chief complaint on file.   HPI:  HPI The patient is a 45 year old gentleman seen in postoperative follow-up.  He is status post fourth and fifth ray amputation on June 15 of this year.  He has continued to have some swelling of the foot some very mild erythema he does recall having pain about a month ago he continues with 2 open areas along his incision that had been slow to heal  Ortho Exam On examination of the left foot the incision is slowly healing there were 2 remaining open areas 1 is 5 mm in diameter this is filled in with flat pink tissue there is no probing.  Distal to this there is a second 5 mm in diameter open area this is filled in with necrotic tissue is about 3 mm deep this does probe.  Does not probe to bone.  Visit Diagnoses:  1. History of complete ray amputation of fifth toe of left foot (HCC)   2. Subacute osteomyelitis, left ankle and foot (HCC)     Plan: Radiographs show fracture through the neck of the second and third metatarsals.  Likely from altered weightbearing after ray amputation.  Concern for nonhealing of his incision.  He will continue with antibacterial ointment dressing changes have requested he follow-up with Dr. Lajoyce Corners.  Concern for possible further limb salvage surgery  Follow-Up Instructions: No follow-ups on file.   Imaging: No results found.  Orders:  Orders Placed This Encounter  Procedures   XR Foot 2 Views Left   No orders of the defined types were placed in this encounter.    PMFS History: Patient Active Problem List   Diagnosis Date Noted   Subacute osteomyelitis, left ankle and foot (HCC)    Diabetic foot infection (HCC) 02/14/2021   Diabetic ulcer of left foot associated with type 1 diabetes mellitus, limited to breakdown of skin (HCC)    Cellulitis  of left foot 02/13/2021   Leukocytosis 02/13/2021   Hyponatremia 02/13/2021   Normocytic anemia 02/13/2021   Lactic acidosis 02/13/2021   Hyperglycemia due to diabetes mellitus (HCC) 02/13/2021   Dehydration 02/13/2021   Open wound of left foot 02/12/2021   Left leg swelling 02/02/2021   History of complete ray amputation of fifth toe of left foot (HCC) 11/04/2020   Immunization due 11/04/2020   Anxiety 09/30/2020   Preventative health care 12/13/2019   S/P aortic valve replacement with mechanical valve 12/05/2019   S/P AVR (aortic valve replacement) 12/05/2019   Syncope 11/10/2019   Personal history of noncompliance with medical treatment, presenting hazards to health 03/16/2018   Uncontrolled type 2 diabetes mellitus with complication (HCC) 10/12/2015   Hyperlipidemia 10/12/2015   Morbid obesity (HCC) 10/12/2015   Chest pain 07/02/2013   HTN (hypertension) 07/02/2013   Past Medical History:  Diagnosis Date   Anxiety    Aortic stenosis    Cellulitis and abscess of lower extremity 06/11/2019   Cellulitis of fourth toe of left foot    Cholelithiasis    Coronary artery disease    Nonobstructive CAD (40-50% LAD) 08/2019   Depression    Depression    Phreesia 09/27/2020   Depression    Phreesia 11/01/2020   Diabetes mellitus without complication (  HCC)    Phreesia 09/27/2020   Elevated troponin level not due myocardial infarction 11/11/2019   Essential hypertension    Gangrene of toe of left foot (HCC) 07/06/2019   Heart murmur    Phreesia 09/27/2020   Hyperlipidemia    Phreesia 09/27/2020   Hypertension    Phreesia 09/27/2020   Mixed hyperlipidemia    Morbid obesity (HCC)    S/P aortic valve replacement with mechanical valve 12/05/2019   25 mm Carbomedics top hat bileaflet mechanical valve via partial upper hemi-sternotomy   Severe aortic stenosis 09/24/2019   Type 2 diabetes mellitus (HCC)     Family History  Problem Relation Age of Onset   Cancer Mother         Brain   Heart disease Father    Hyperlipidemia Father    Hypertension Father    Stroke Father    Heart murmur Sister     Past Surgical History:  Procedure Laterality Date   ABDOMINAL AORTOGRAM W/LOWER EXTREMITY N/A 07/06/2019   Procedure: ABDOMINAL AORTOGRAM W/LOWER EXTREMITY;  Surgeon: Sherren Kerns, MD;  [facility]: [facility] INVAS[facility];  Service: Cardiovascular;  Laterality: N/A;  Bilateral   AMPUTATION Left 07/09/2019   Procedure: LEFT FOURTH and Fifth TOE AMPUTATION.;  Surgeon: Larina Earthly, MD;  Location: [facility] OR;  Service: Vascular;  Laterality: Left;   AMPUTATION Left 03/11/2021   Procedure: LEFT FOOT 5TH  AND 4TH RAY AMPUTATION;  Surgeon: Nadara Mustard, MD;  Location: [facility] OR;  Service: Orthopedics;  Laterality: Left;   AORTIC VALVE REPLACEMENT N/A 12/05/2019   Procedure: PARTIAL STERNOTOMY FOR AORTIC VALVE REPLACEMENT (AVR), USING CARBOMEDICS SUPRA-ANNULAR TOP HAT ;  Surgeon: Purcell Nails, MD;  Location: [facility] OR;  Service: Open Heart Surgery;  Laterality: N/A;  No neck lines on left   CARDIAC VALVE REPLACEMENT N/A    Phreesia 09/27/2020   IR RADIOLOGY PERIPHERAL GUIDED IV START  10/05/2019   IR US GUIDE VASC ACCESS RIGHT  10/05/2019   MULTIPLE EXTRACTIONS WITH ALVEOLOPLASTY N/A 10/26/2019   Procedure: EXTRACTION OF TOOTH #'S 3, 5-11,19-28,  AND 32 WITH ALVEOLOPLASTY;  Surgeon: Charlynne Pander, DDS;  Location: [facility] OR;  Service: Oral Surgery;  Laterality: N/A;   RIGHT HEART CATH AND CORONARY ANGIOGRAPHY N/A 09/24/2019   Procedure: RIGHT HEART CATH AND CORONARY ANGIOGRAPHY;  Surgeon: Tonny Bollman, MD;  Location: [facility] INVASIVE CV LAB;  Service: Cardiovascular;  Laterality: N/A;   TEE WITHOUT CARDIOVERSION N/A 12/05/2019   Procedure: TRANSESOPHAGEAL ECHOCARDIOGRAM (TEE);  Surgeon: Purcell Nails, MD;  Location: [facility] OR;  Service: Open Heart Surgery;  Laterality: N/A;   Social History   Occupational History    Comment: Glass blower/designer- self-employed  Tobacco Use   Smoking  status: Never   Smokeless tobacco: Former    Types: Chew    Quit date: 2021  Vaping Use   Vaping Use: Never used  Substance and Sexual Activity   Alcohol use: Not Currently    Comment: occasionally   Drug use: No   Sexual activity: Yes    Birth control/protection: None

## 2021-05-21 ENCOUNTER — Other Ambulatory Visit: Payer: Self-pay

## 2021-05-21 ENCOUNTER — Ambulatory Visit (INDEPENDENT_AMBULATORY_CARE_PROVIDER_SITE_OTHER): Payer: 59 | Admitting: Orthopedic Surgery

## 2021-05-21 DIAGNOSIS — Z89422 Acquired absence of other left toe(s): Secondary | ICD-10-CM

## 2021-05-22 ENCOUNTER — Encounter: Payer: Self-pay | Admitting: Orthopedic Surgery

## 2021-05-22 NOTE — Progress Notes (Signed)
Office Visit Note   Patient: Adam Long           Date of Birth: Nov 03, 1975           MRN: [id number] Visit Date: 05/21/2021              Requested by: Heather Roberts, NP [address]  [address] [place],  [place] [address] PCP: Heather Roberts, NP  Chief Complaint  Patient presents with   Left Foot - Routine Post Op    03/11/21 left foot 4th and 5th ray amputation       HPI: Patient is a 45 year old gentleman who presents 9 weeks status post left foot fourth and fifth ray amputation.  Patient states he has swelling with pitting edema.  Previous radiographs showed a fracture through the neck of the second and third metatarsals.  Patient is currently full weightbearing in a postoperative shoe.  Assessment & Plan: Visit Diagnoses:  1. History of complete ray amputation of fifth toe of left foot (HCC)     Plan: Recommended compression patient was placed in a crew height sock.  Nails were trimmed.  Recommended knee-high compression socks.  Follow-Up Instructions: Return in about 2 weeks (around 06/04/2021).   Ortho Exam  Patient is alert, oriented, no adenopathy, well-dressed, normal affect, normal respiratory effort. Examination and there is significant swelling on the foot and leg there is no open ulcers.  There is dependent redness of the foot which resolves with massage.  His calf is 59 cm in circumference.  Imaging: No results found. No images are attached to the encounter.  Labs: Lab Results  Component Value Date   HGBA1C 6.5 (H) 02/02/2021   HGBA1C 6.0 12/17/2020   HGBA1C 6.0 (A) 06/19/2020   ESRSEDRATE 62 (H) 02/12/2021   CRP 5.8 (H) 02/15/2021   REPTSTATUS 02/18/2021 FINAL 02/12/2021   REPTSTATUS 02/18/2021 FINAL 02/12/2021   GRAMSTAIN  12/08/2015    ABUNDANT WBC PRESENT, PREDOMINANTLY PMN NO SQUAMOUS EPITHELIAL CELLS SEEN ABUNDANT GRAM POSITIVE COCCI IN CLUSTERS Performed at [facility]    CULT  02/12/2021    NO GROWTH 6 DAYS Performed  at [facility], [address] [place], [place] [address]    CULT  02/12/2021    NO GROWTH 6 DAYS Performed at [facility], [address], [place], [place] [address]      Lab Results  Component Value Date   ALBUMIN 3.2 (L) 02/15/2021   ALBUMIN 3.0 (L) 02/13/2021   ALBUMIN 4.0 02/02/2021    Lab Results  Component Value Date   MG 1.8 02/13/2021   MG 2.1 12/06/2019   MG 2.4 12/06/2019   Lab Results  Component Value Date   VD25OH 38 06/13/2020    No results found for: PREALBUMIN CBC EXTENDED Latest Ref Rng & Units 03/02/2021 02/17/2021 02/16/2021  WBC 3.4 - 10.8 x10E3/uL 9.1 9.3 10.3  RBC 4.14 - 5.80 x10E6/uL 4.66 4.38 4.37  HGB 13.0 - 17.7 g/dL 21.3 12.6(L) 12.5(L)  HCT 37.5 - 51.0 % 39.6 38.7(L) 39.0  PLT 150 - 450 x10E3/uL 246 260 289  NEUTROABS 1.4 - 7.0 x10E3/uL 6.2 - -  LYMPHSABS 0.7 - 3.1 x10E3/uL 1.9 - -     There is no height or weight on file to calculate BMI.  Orders:  No orders of the defined types were placed in this encounter.  No orders of the defined types were placed in this encounter.    Procedures: No procedures performed  Clinical Data: No additional findings.  ROS:  All other systems negative, except as noted in the HPI. Review of Systems  Objective: Vital Signs: There were no vitals taken for this visit.  Specialty Comments:  No specialty comments available.  PMFS History: Patient Active Problem List   Diagnosis Date Noted   Subacute osteomyelitis, left ankle and foot (HCC)    Diabetic foot infection (HCC) 02/14/2021   Diabetic ulcer of left foot associated with type 1 diabetes mellitus, limited to breakdown of skin (HCC)    Cellulitis of left foot 02/13/2021   Leukocytosis 02/13/2021   Hyponatremia 02/13/2021   Normocytic anemia 02/13/2021   Lactic acidosis 02/13/2021   Hyperglycemia due to diabetes mellitus (HCC) 02/13/2021   Dehydration 02/13/2021   Open wound of left foot 02/12/2021   Left leg swelling 02/02/2021    History of complete ray amputation of fifth toe of left foot (HCC) 11/04/2020   Immunization due 11/04/2020   Anxiety 09/30/2020   Preventative health care 12/13/2019   S/P aortic valve replacement with mechanical valve 12/05/2019   S/P AVR (aortic valve replacement) 12/05/2019   Syncope 11/10/2019   Personal history of noncompliance with medical treatment, presenting hazards to health 03/16/2018   Uncontrolled type 2 diabetes mellitus with complication (HCC) 10/12/2015   Hyperlipidemia 10/12/2015   Morbid obesity (HCC) 10/12/2015   Chest pain 07/02/2013   HTN (hypertension) 07/02/2013   Past Medical History:  Diagnosis Date   Anxiety    Aortic stenosis    Cellulitis and abscess of lower extremity 06/11/2019   Cellulitis of fourth toe of left foot    Cholelithiasis    Coronary artery disease    Nonobstructive CAD (40-50% LAD) 08/2019   Depression    Depression    Phreesia 09/27/2020   Depression    Phreesia 11/01/2020   Diabetes mellitus without complication (HCC)    Phreesia 09/27/2020   Elevated troponin level not due myocardial infarction 11/11/2019   Essential hypertension    Gangrene of toe of left foot (HCC) 07/06/2019   Heart murmur    Phreesia 09/27/2020   Hyperlipidemia    Phreesia 09/27/2020   Hypertension    Phreesia 09/27/2020   Mixed hyperlipidemia    Morbid obesity (HCC)    S/P aortic valve replacement with mechanical valve 12/05/2019   25 mm Carbomedics top hat bileaflet mechanical valve via partial upper hemi-sternotomy   Severe aortic stenosis 09/24/2019   Type 2 diabetes mellitus (HCC)     Family History  Problem Relation Age of Onset   Cancer Mother        Brain   Heart disease Father    Hyperlipidemia Father    Hypertension Father    Stroke Father    Heart murmur Sister     Past Surgical History:  Procedure Laterality Date   ABDOMINAL AORTOGRAM W/LOWER EXTREMITY N/A 07/06/2019   Procedure: ABDOMINAL AORTOGRAM W/LOWER EXTREMITY;  Surgeon:  Sherren Kerns, MD;  Location: [facility] INVAS[facility] LAB;  Service: Cardiovascular;  Laterality: N/A;  Bilateral   AMPUTATION Left 07/09/2019   Procedure: LEFT FOURTH and Fifth TOE AMPUTATION.;  Surgeon: Larina Earthly, MD;  Location: [facility] OR;  Service: Vascular;  Laterality: Left;   AMPUTATION Left 03/11/2021   Procedure: LEFT FOOT 5TH  AND 4TH RAY AMPUTATION;  Surgeon: Nadara Mustard, MD;  Location: [facility] OR;  Service: Orthopedics;  Laterality: Left;   AORTIC VALVE REPLACEMENT N/A 12/05/2019   Procedure: PARTIAL STERNOTOMY FOR AORTIC VALVE REPLACEMENT (AVR), USING  CARBOMEDICS SUPRA-ANNULAR TOP HAT ;  Surgeon: Purcell Nails, MD;  Location: [facility] OR;  Service: Open Heart Surgery;  Laterality: N/A;  No neck lines on left   CARDIAC VALVE REPLACEMENT N/A    Phreesia 09/27/2020   IR RADIOLOGY PERIPHERAL GUIDED IV START  10/05/2019   IR US GUIDE VASC ACCESS RIGHT  10/05/2019   MULTIPLE EXTRACTIONS WITH ALVEOLOPLASTY N/A 10/26/2019   Procedure: EXTRACTION OF TOOTH #'S 3, 5-11,19-28,  AND 32 WITH ALVEOLOPLASTY;  Surgeon: Charlynne Pander, DDS;  Location: [facility] OR;  Service: Oral Surgery;  Laterality: N/A;   RIGHT HEART CATH AND CORONARY ANGIOGRAPHY N/A 09/24/2019   Procedure: RIGHT HEART CATH AND CORONARY ANGIOGRAPHY;  Surgeon: Tonny Bollman, MD;  Location: [facility] INVASIVE CV LAB;  Service: Cardiovascular;  Laterality: N/A;   TEE WITHOUT CARDIOVERSION N/A 12/05/2019   Procedure: TRANSESOPHAGEAL ECHOCARDIOGRAM (TEE);  Surgeon: Purcell Nails, MD;  Location: [facility] OR;  Service: Open Heart Surgery;  Laterality: N/A;   Social History   Occupational History    Comment: Glass blower/designer- self-employed  Tobacco Use   Smoking status: Never   Smokeless tobacco: Former    Types: Chew    Quit date: 2021  Vaping Use   Vaping Use: Never used  Substance and Sexual Activity   Alcohol use: Not Currently    Comment: occasionally   Drug use: No   Sexual activity: Yes    Birth control/protection: None

## 2021-06-04 ENCOUNTER — Ambulatory Visit (INDEPENDENT_AMBULATORY_CARE_PROVIDER_SITE_OTHER): Payer: 59 | Admitting: Nurse Practitioner

## 2021-06-04 ENCOUNTER — Encounter: Payer: Self-pay | Admitting: Nurse Practitioner

## 2021-06-04 ENCOUNTER — Ambulatory Visit (INDEPENDENT_AMBULATORY_CARE_PROVIDER_SITE_OTHER): Payer: 59 | Admitting: *Deleted

## 2021-06-04 ENCOUNTER — Other Ambulatory Visit: Payer: Self-pay

## 2021-06-04 VITALS — BP 137/71 | HR 88 | Ht 70.0 in | Wt 374.0 lb

## 2021-06-04 DIAGNOSIS — IMO0002 Reserved for concepts with insufficient information to code with codable children: Secondary | ICD-10-CM

## 2021-06-04 DIAGNOSIS — E118 Type 2 diabetes mellitus with unspecified complications: Secondary | ICD-10-CM

## 2021-06-04 DIAGNOSIS — D72829 Elevated white blood cell count, unspecified: Secondary | ICD-10-CM | POA: Diagnosis not present

## 2021-06-04 DIAGNOSIS — Z952 Presence of prosthetic heart valve: Secondary | ICD-10-CM

## 2021-06-04 DIAGNOSIS — Z5181 Encounter for therapeutic drug level monitoring: Secondary | ICD-10-CM | POA: Diagnosis not present

## 2021-06-04 DIAGNOSIS — E782 Mixed hyperlipidemia: Secondary | ICD-10-CM

## 2021-06-04 DIAGNOSIS — I10 Essential (primary) hypertension: Secondary | ICD-10-CM

## 2021-06-04 DIAGNOSIS — E871 Hypo-osmolality and hyponatremia: Secondary | ICD-10-CM

## 2021-06-04 DIAGNOSIS — E1165 Type 2 diabetes mellitus with hyperglycemia: Secondary | ICD-10-CM

## 2021-06-04 LAB — POCT INR: INR: 2.4 (ref 2.0–3.0)

## 2021-06-04 NOTE — Assessment & Plan Note (Signed)
-  checking labs today 

## 2021-06-04 NOTE — Progress Notes (Signed)
Established Patient Office Visit  Subjective:  Patient ID: Adam Long, male    DOB: [birth year]  Age: 45 y.o. MRN: [id number]  CC:  Chief Complaint  Patient presents with   Hyperlipidemia    Follow up     HPI Adam Long presents for follow-up visit. He has been seeing Dr. Sharol Given at the wound clinic for left diabetic foot wound, and this resulted in an amputation of his fifth toe on his left foot. He follows up with Dr. Sharol Given again next week.  Past Medical History:  Diagnosis Date   Anxiety    Aortic stenosis    Cellulitis and abscess of lower extremity 06/11/2019   Cellulitis of fourth toe of left foot    Cholelithiasis    Coronary artery disease    Nonobstructive CAD (40-50% LAD) 08/2019   Depression    Depression    Phreesia 09/27/2020   Depression    Phreesia 11/01/2020   Diabetes mellitus without complication ([place])    Phreesia 09/27/2020   Elevated troponin level not due myocardial infarction 11/11/2019   Essential hypertension    Gangrene of toe of left foot ([place]) 07/06/2019   Heart murmur    Phreesia 09/27/2020   Hyperlipidemia    Phreesia 09/27/2020   Hypertension    Phreesia 09/27/2020   Mixed hyperlipidemia    Morbid obesity ([place])    S/P aortic valve replacement with mechanical valve 12/05/2019   25 mm Carbomedics top hat bileaflet mechanical valve via partial upper hemi-sternotomy   Severe aortic stenosis 09/24/2019   Type 2 diabetes mellitus ([place])     Past Surgical History:  Procedure Laterality Date   ABDOMINAL AORTOGRAM W/LOWER EXTREMITY N/A 07/06/2019   Procedure: ABDOMINAL AORTOGRAM W/LOWER EXTREMITY;  Surgeon: Elam Dutch, MD;  Location: [place] CV LAB;  Service: Cardiovascular;  Laterality: N/A;  Bilateral   AMPUTATION Left 07/09/2019   Procedure: LEFT FOURTH and Fifth TOE AMPUTATION.;  Surgeon: Rosetta Posner, MD;  Location: [place];  Service: Vascular;  Laterality: Left;   AMPUTATION Left 03/11/2021   Procedure: LEFT FOOT 5TH  AND  4TH RAY AMPUTATION;  Surgeon: Newt Minion, MD;  Location: [place];  Service: Orthopedics;  Laterality: Left;   AORTIC VALVE REPLACEMENT N/A 12/05/2019   Procedure: PARTIAL STERNOTOMY FOR AORTIC VALVE REPLACEMENT (AVR), USING CARBOMEDICS SUPRA-ANNULAR TOP HAT 25MM;  Surgeon: Rexene Alberts, MD;  Location: [place];  Service: Open Heart Surgery;  Laterality: N/A;  No neck lines on left   CARDIAC VALVE REPLACEMENT N/A    Phreesia 09/27/2020   IR RADIOLOGY PERIPHERAL GUIDED IV START  10/05/2019   IR US GUIDE VASC ACCESS RIGHT  10/05/2019   MULTIPLE EXTRACTIONS WITH ALVEOLOPLASTY N/A 10/26/2019   Procedure: EXTRACTION OF TOOTH #'S 3, 5-11,19-28,  AND 32 WITH ALVEOLOPLASTY;  Surgeon: Lenn Cal, DDS;  Location: [place];  Service: Oral Surgery;  Laterality: N/A;   RIGHT HEART CATH AND CORONARY ANGIOGRAPHY N/A 09/24/2019   Procedure: RIGHT HEART CATH AND CORONARY ANGIOGRAPHY;  Surgeon: Sherren Mocha, MD;  Location: [place] CV LAB;  Service: Cardiovascular;  Laterality: N/A;   TEE WITHOUT CARDIOVERSION N/A 12/05/2019   Procedure: TRANSESOPHAGEAL ECHOCARDIOGRAM (TEE);  Surgeon: Rexene Alberts, MD;  Location: [place];  Service: Open Heart Surgery;  Laterality: N/A;    Family History  Problem Relation Age of Onset   Cancer Mother        Brain   Heart disease Father    Hyperlipidemia Father  Hypertension Father    Stroke Father    Heart murmur Sister     Social History   Socioeconomic History   Marital status: Single    Spouse name: Not on file   Number of children: Not on file   Years of education: Not on file   Highest education level: Not on file  Occupational History    Comment: Neurosurgeon- self-employed  Tobacco Use   Smoking status: Never   Smokeless tobacco: Former    Types: Chew    Quit date: 2021  Vaping Use   Vaping Use: Never used  Substance and Sexual Activity   Alcohol use: Not Currently    Comment: occasionally   Drug use: No   Sexual activity: Yes     Birth control/protection: None  Other Topics Concern   Not on file  Social History Narrative   Not on file   Social Determinants of Health   Financial Resource Strain: Not on file  Food Insecurity: Not on file  Transportation Needs: Not on file  Physical Activity: Not on file  Stress: Not on file  Social Connections: Not on file  Intimate Partner Violence: Not on file    Outpatient Medications Prior to Visit  Medication Sig Dispense Refill   atorvastatin (LIPITOR) 80 MG tablet Take 1 tablet (80 mg total) by mouth daily. 90 tablet 3   insulin glargine (LANTUS SOLOSTAR) 100 UNIT/ML Solostar Pen Inject 30 Units into the skin at bedtime. 30 mL 3   lisinopril (ZESTRIL) 20 MG tablet TAKE 1 Tablet BY MOUTH ONCE EVERY DAY 90 tablet 2   metoprolol tartrate (LOPRESSOR) 25 MG tablet Take 1 tablet (25 mg total) by mouth 2 (two) times daily. 180 tablet 1   sertraline (ZOLOFT) 50 MG tablet Take 1 tablet by mouth once daily 90 tablet 1   sitaGLIPtin-metformin (JANUMET) 50-500 MG tablet Take 1 tablet by mouth 2 (two) times daily with a meal. 180 tablet 3   warfarin (COUMADIN) 10 MG tablet Take 1 tablet daily, 1/2 tablet on Tuesdays only (Patient taking differently: Take 1 tablet daily.) 45 tablet 1   cephALEXin (KEFLEX) 500 MG capsule Take 1 capsule (500 mg total) by mouth 3 (three) times daily. (Patient not taking: Reported on 06/04/2021) 30 capsule 0   oxyCODONE-acetaminophen (PERCOCET) 5-325 MG tablet Take 1 tablet by mouth every 4 (four) hours as needed. (Patient not taking: Reported on 06/04/2021) 30 tablet 0   No facility-administered medications prior to visit.    Allergies  Allergen Reactions   Pollen Extract Other (See Comments)    Runny nose, watery eyes, sneezing    ROS Review of Systems  Constitutional: Negative.   Respiratory: Negative.    Cardiovascular:        Left leg swelling since surgery; he has compression stockings and is closely followed by ortho/wound care   Musculoskeletal: Negative.   Psychiatric/Behavioral: Negative.       Objective:    Physical Exam Constitutional:      Appearance: Normal appearance.  Cardiovascular:     Rate and Rhythm: Normal rate and regular rhythm.     Pulses: Normal pulses.     Heart sounds: Normal heart sounds.  Pulmonary:     Effort: Pulmonary effort is normal.     Breath sounds: Normal breath sounds.  Musculoskeletal:        General: Normal range of motion.     Comments: Left foot/ankle in orthopedic boot  Neurological:     Mental  Status: He is alert.  Psychiatric:        Mood and Affect: Mood normal.        Behavior: Behavior normal.        Thought Content: Thought content normal.        Judgment: Judgment normal.    BP 137/71 (BP Location: Left Arm, Patient Position: Sitting, Cuff Size: Large)   Pulse 88   Ht '5\' 10"'  (1.778 m)   Wt (!) 374 lb (169.6 kg)   SpO2 97%   BMI 53.66 kg/m  Wt Readings from Last 3 Encounters:  06/04/21 (!) 374 lb (169.6 kg)  03/16/21 (!) 368 lb (166.9 kg)  03/11/21 (!) 354 lb (160.6 kg)     Health Maintenance Due  Topic Date Due   FOOT EXAM  07/01/2020   INFLUENZA VACCINE  04/27/2021   COLONOSCOPY (Pts 45-55yr Insurance coverage will need to be confirmed)  Never done   OPHTHALMOLOGY EXAM  05/21/2021    There are no preventive care reminders to display for this patient.  Lab Results  Component Value Date   TSH 2.25 06/13/2020   Lab Results  Component Value Date   WBC 9.1 03/02/2021   HGB 13.3 03/02/2021   HCT 39.6 03/02/2021   MCV 85 03/02/2021   PLT 246 03/02/2021   Lab Results  Component Value Date   NA 137 03/02/2021   K 5.2 03/02/2021   CO2 26 03/02/2021   GLUCOSE 133 (H) 03/02/2021   BUN 13 03/02/2021   CREATININE 0.80 03/02/2021   BILITOT 1.1 02/15/2021   ALKPHOS 54 02/15/2021   AST 22 02/15/2021   ALT 25 02/15/2021   PROT 7.5 02/15/2021   ALBUMIN 3.2 (L) 02/15/2021   CALCIUM 9.5 03/02/2021   ANIONGAP 5 02/17/2021   EGFR 112  03/02/2021   Lab Results  Component Value Date   CHOL 141 02/02/2021   Lab Results  Component Value Date   HDL 41 02/02/2021   Lab Results  Component Value Date   LDLCALC 76 02/02/2021   Lab Results  Component Value Date   TRIG 136 02/02/2021   Lab Results  Component Value Date   CHOLHDL 3.4 07/17/2020   Lab Results  Component Value Date   HGBA1C 6.5 (H) 02/02/2021      Assessment & Plan:   Problem List Items Addressed This Visit       Cardiovascular and Mediastinum   HTN (hypertension)    BP Readings from Last 3 Encounters:  06/04/21 137/71  03/16/21 136/77  03/11/21 125/77  -well controlled with metoprolol and lisinopril      Relevant Orders   CMP14+EGFR   CBC with Differential/Platelet   Lipid Panel With LDL/HDL Ratio   CBC with Differential/Platelet   Lipid Panel With LDL/HDL Ratio   CMP14+EGFR     Endocrine   Uncontrolled type 2 diabetes mellitus with complication (HCC) - Primary    -checking A1c today      Relevant Orders   CMP14+EGFR   CBC with Differential/Platelet   Lipid Panel With LDL/HDL Ratio   Hemoglobin A1c   CBC with Differential/Platelet   Lipid Panel With LDL/HDL Ratio   CMP14+EGFR   Hemoglobin A1c     Other   Hyperlipidemia    -checking labs today      Relevant Orders   Lipid Panel With LDL/HDL Ratio   Lipid Panel With LDL/HDL Ratio   Leukocytosis    -checking labs today      Relevant Orders  CBC with Differential/Platelet   CBC with Differential/Platelet   Hyponatremia    -checking labs today      Relevant Orders   CMP14+EGFR   CMP14+EGFR    No orders of the defined types were placed in this encounter.   Follow-up: Return in about 4 months (around 10/04/2021) for Lab follow-up (HTN, HLD, T2DM).    Noreene Larsson, NP

## 2021-06-04 NOTE — Patient Instructions (Signed)
Please have labs drawn today.  We will meet up in a few months to recheck labs. Please have fasting labs drawn 2-3 days prior to your appointment so we can discuss the results during your office visit.

## 2021-06-04 NOTE — Patient Instructions (Signed)
Continue warfarin 1 tablet daily Recheck in 6 wk 

## 2021-06-04 NOTE — Assessment & Plan Note (Signed)
BP Readings from Last 3 Encounters:  06/04/21 137/71  03/16/21 136/77  03/11/21 125/77   -well controlled with metoprolol and lisinopril

## 2021-06-04 NOTE — Assessment & Plan Note (Signed)
-  checking A1c today 

## 2021-06-05 ENCOUNTER — Ambulatory Visit: Payer: 59 | Admitting: Nurse Practitioner

## 2021-06-05 LAB — CBC WITH DIFFERENTIAL/PLATELET
Basophils Absolute: 0 10*3/uL (ref 0.0–0.2)
Basos: 0 %
EOS (ABSOLUTE): 0.1 10*3/uL (ref 0.0–0.4)
Eos: 1 %
Hematocrit: 36.9 % — ABNORMAL LOW (ref 37.5–51.0)
Hemoglobin: 11.8 g/dL — ABNORMAL LOW (ref 13.0–17.7)
Immature Grans (Abs): 0.1 10*3/uL (ref 0.0–0.1)
Immature Granulocytes: 1 %
Lymphocytes Absolute: 1.5 10*3/uL (ref 0.7–3.1)
Lymphs: 22 %
MCH: 27.3 pg (ref 26.6–33.0)
MCHC: 32 g/dL (ref 31.5–35.7)
MCV: 85 fL (ref 79–97)
Monocytes Absolute: 0.8 10*3/uL (ref 0.1–0.9)
Monocytes: 11 %
Neutrophils Absolute: 4.3 10*3/uL (ref 1.4–7.0)
Neutrophils: 65 %
Platelets: 240 10*3/uL (ref 150–450)
RBC: 4.32 x10E6/uL (ref 4.14–5.80)
RDW: 13.7 % (ref 11.6–15.4)
WBC: 6.7 10*3/uL (ref 3.4–10.8)

## 2021-06-05 LAB — CMP14+EGFR
ALT: 18 IU/L (ref 0–44)
AST: 17 IU/L (ref 0–40)
Albumin/Globulin Ratio: 1.1 — ABNORMAL LOW (ref 1.2–2.2)
Albumin: 3.8 g/dL — ABNORMAL LOW (ref 4.0–5.0)
Alkaline Phosphatase: 90 IU/L (ref 44–121)
BUN/Creatinine Ratio: 15 (ref 9–20)
BUN: 12 mg/dL (ref 6–24)
Bilirubin Total: 0.8 mg/dL (ref 0.0–1.2)
CO2: 26 mmol/L (ref 20–29)
Calcium: 9.1 mg/dL (ref 8.7–10.2)
Chloride: 99 mmol/L (ref 96–106)
Creatinine, Ser: 0.82 mg/dL (ref 0.76–1.27)
Globulin, Total: 3.5 g/dL (ref 1.5–4.5)
Glucose: 130 mg/dL — ABNORMAL HIGH (ref 65–99)
Potassium: 4.6 mmol/L (ref 3.5–5.2)
Sodium: 136 mmol/L (ref 134–144)
Total Protein: 7.3 g/dL (ref 6.0–8.5)
eGFR: 110 mL/min/{1.73_m2} (ref >59)

## 2021-06-05 LAB — LIPID PANEL WITH LDL/HDL RATIO
Cholesterol, Total: 150 mg/dL (ref 100–199)
HDL: 40 mg/dL (ref >39)
LDL Chol Calc (NIH): 88 mg/dL (ref 0–99)
LDL/HDL Ratio: 2.2 ratio (ref 0.0–3.6)
Triglycerides: 122 mg/dL (ref 0–149)
VLDL Cholesterol Cal: 22 mg/dL (ref 5–40)

## 2021-06-05 LAB — HEMOGLOBIN A1C
Est. average glucose Bld gHb Est-mCnc: 148 mg/dL
Hgb A1c MFr Bld: 6.8 % — ABNORMAL HIGH (ref 4.8–5.6)

## 2021-06-05 NOTE — Progress Notes (Signed)
A1c looks good at 6.8. Hemoglobin is a little low at 11.8, but you had recent surgery. We will monitor that with routine labs. Have you had any signs of bleeding, like dark, tarry stools?

## 2021-06-11 ENCOUNTER — Ambulatory Visit: Payer: 59 | Admitting: Orthopedic Surgery

## 2021-06-11 IMAGING — CR DG CHEST 1V PORT
1 series · 1 of 1 positions shown · non-contrast
Comparison: Chest radiograph dated 07/02/2013

CLINICAL DATA: 43-year-old male with possible sepsis.

EXAM:
PORTABLE CHEST 1 VIEW

[portable]
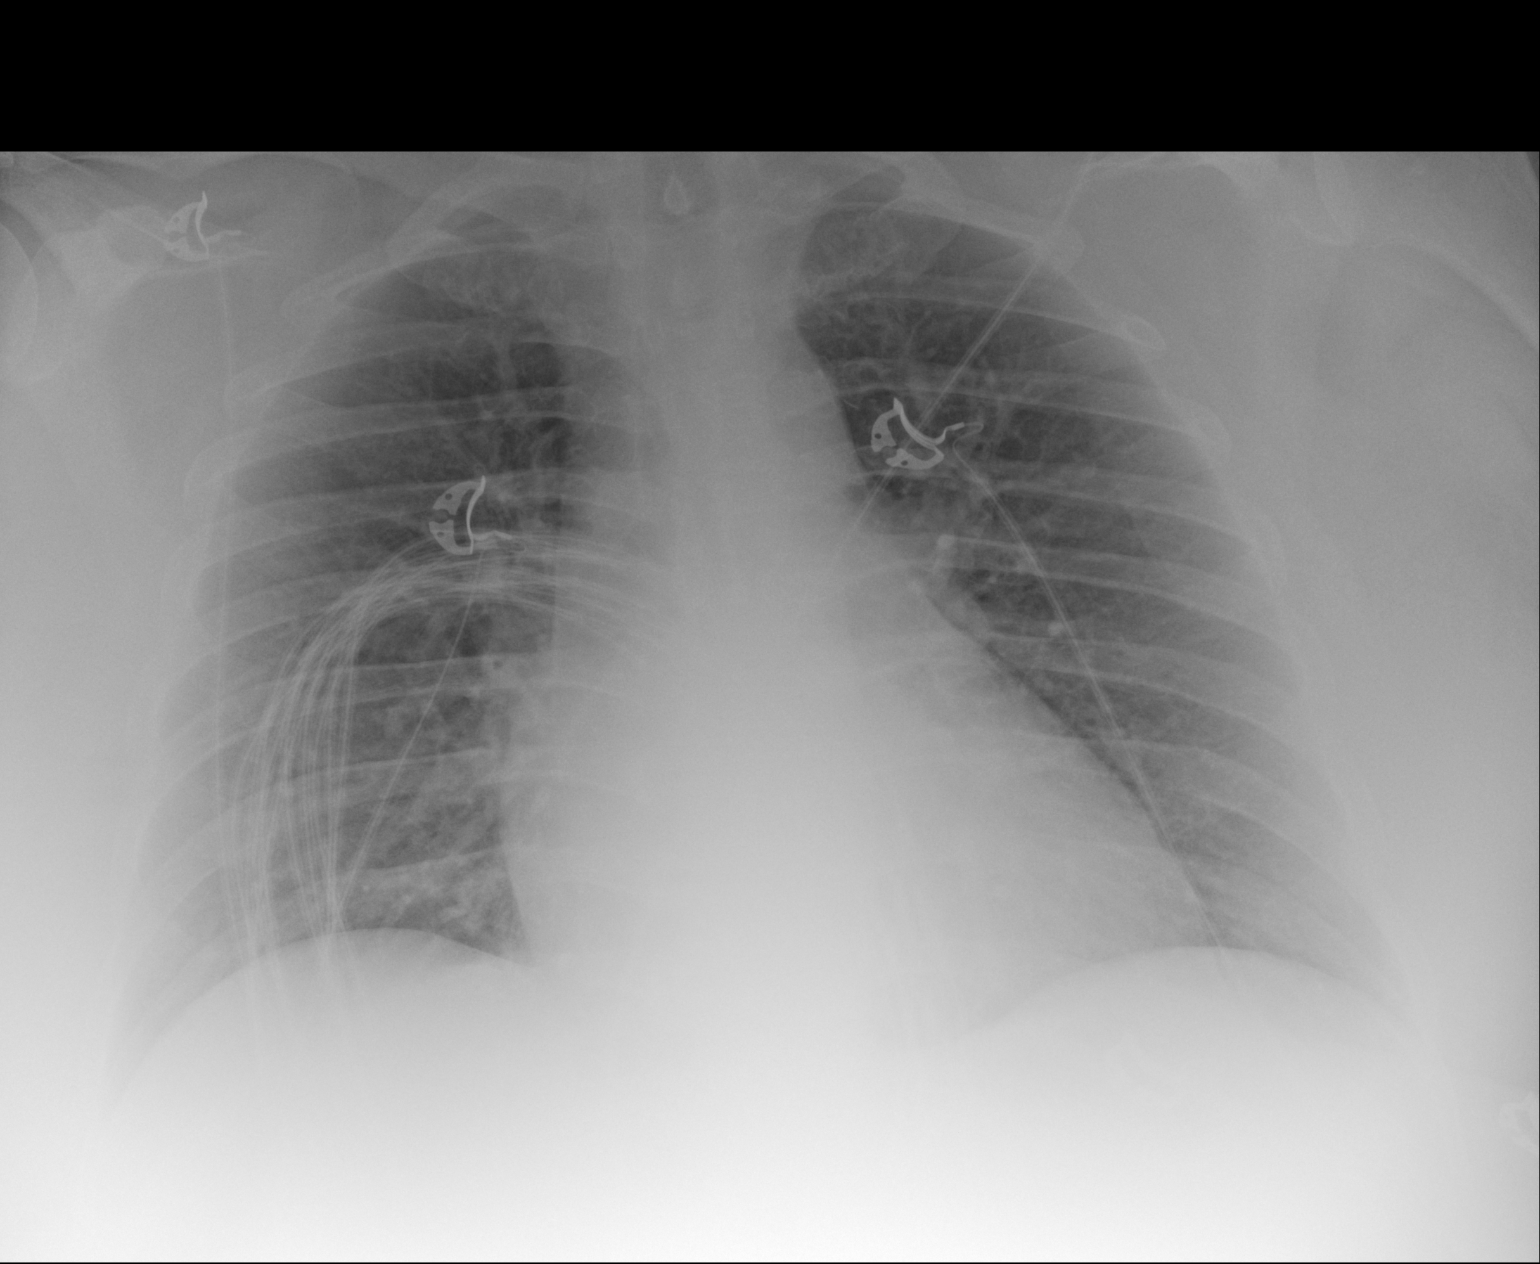

[1 of 1 positions shown; findings below may reference images not displayed]

FINDINGS: The lungs are clear. There is no pleural effusion or pneumothorax.
Top-normal cardiac silhouette. No acute osseous pathology.
IMPRESSION: No active disease.

## 2021-06-13 IMAGING — MR MR FOOT*L* W/O CM
4 of 6 series · 16 of 40 positions shown · non-contrast
Comparison: Left foot x-rays dated June 11, 2019.

CLINICAL DATA: Left foot redness and swelling several days after
fourth toe puncture wound. Stepped on a tack.

EXAM:
MRI OF THE LEFT FOOT WITHOUT CONTRAST
TECHNIQUE: Multiplanar, multisequence MR imaging of the left forefoot was
performed. No intravenous contrast was administered.

[Series 5: T1 · oblique · 3.0mm · 0.22mm/px · 3 of 40 slices shown]
[im 7/40]
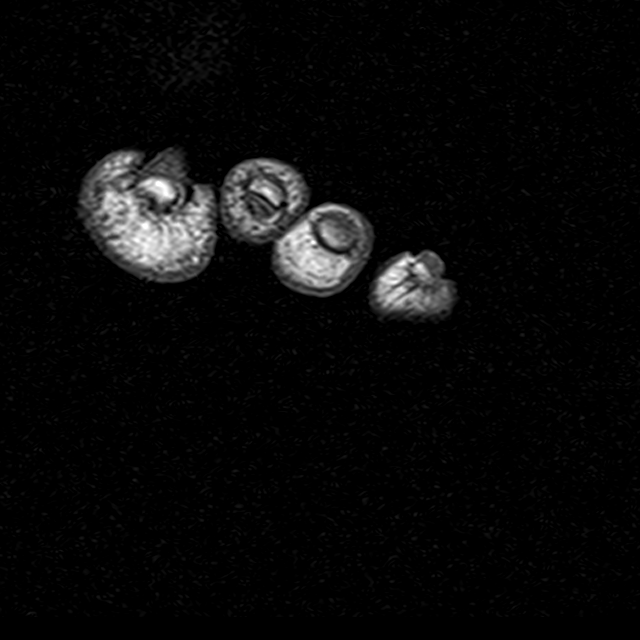
[im 20/40]
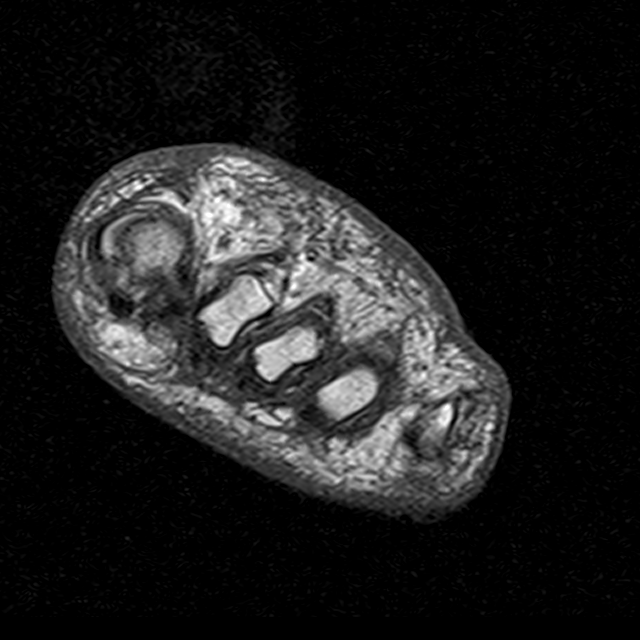
[im 33/40]
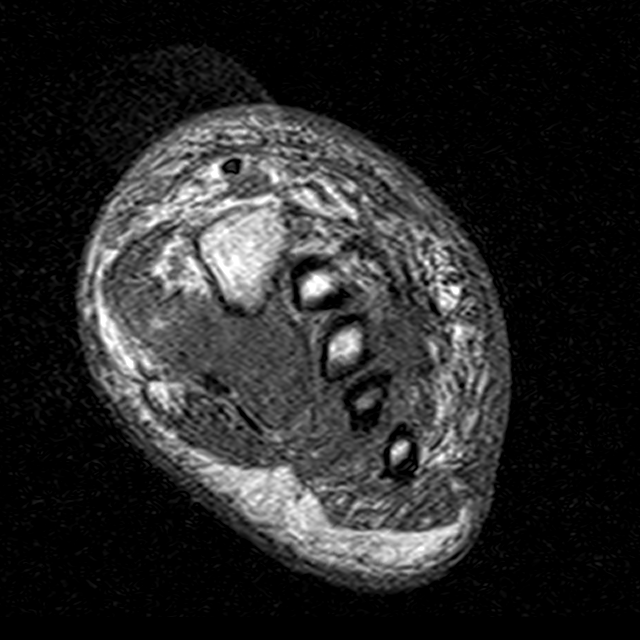

[Series 6: T2 fat-sat · oblique · 3.0mm · 0.22mm/px · 7 of 40 slices shown (1 of 3)]
[im 1/40]
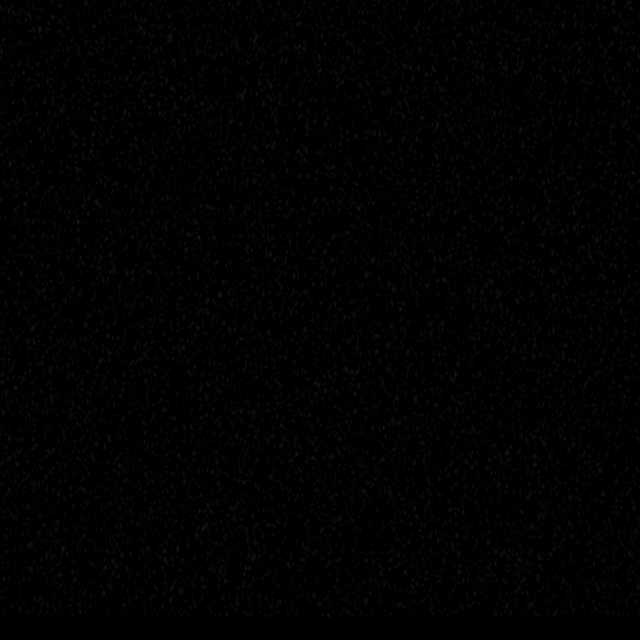
[im 6/40]
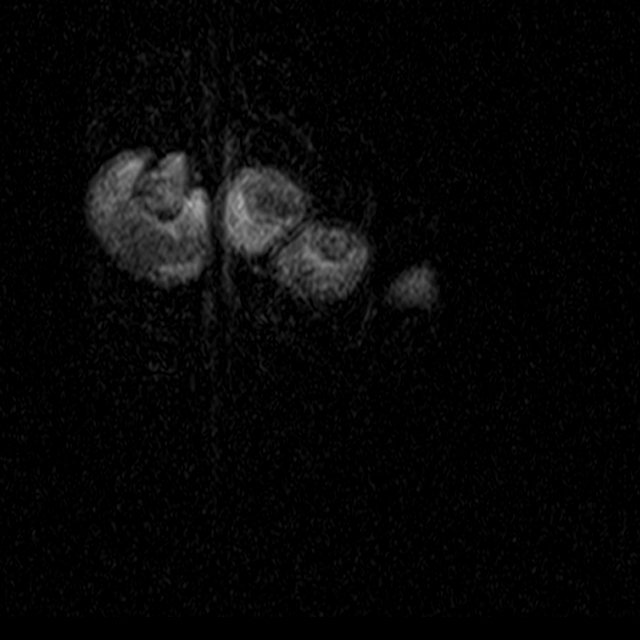
[im 12/40]
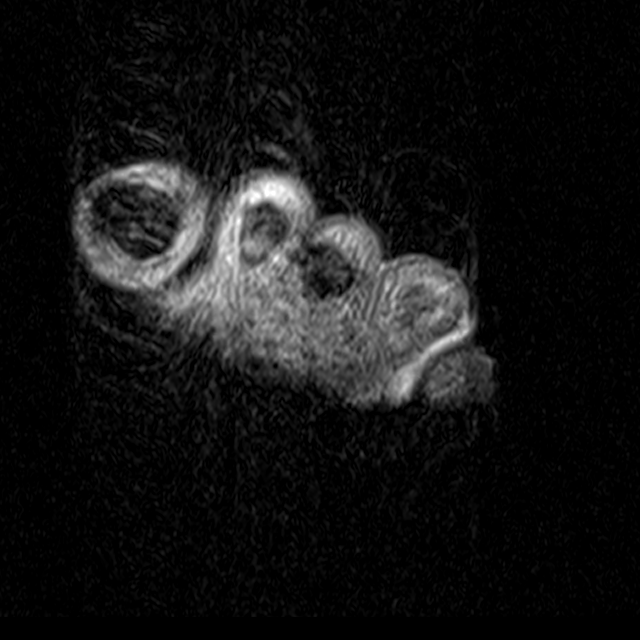
[im 17/40]
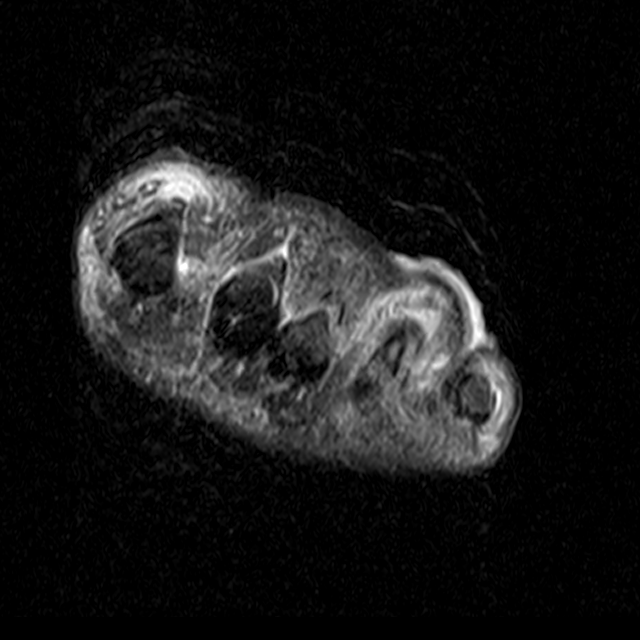
[im 23/40]
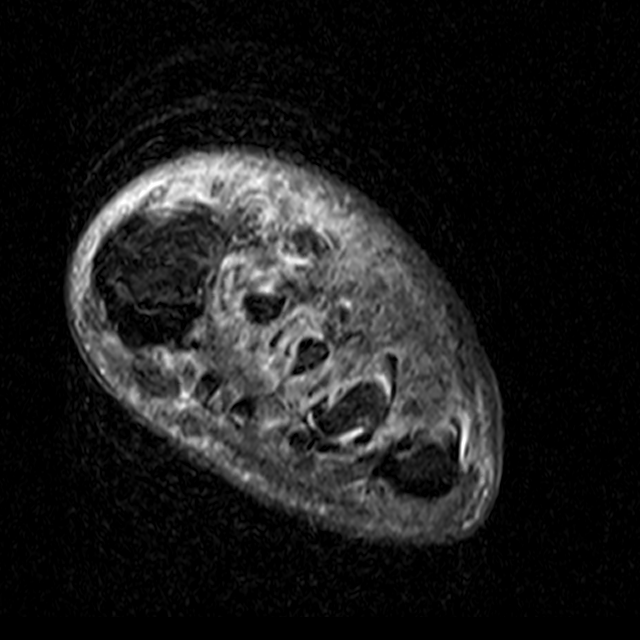
[im 28/40]
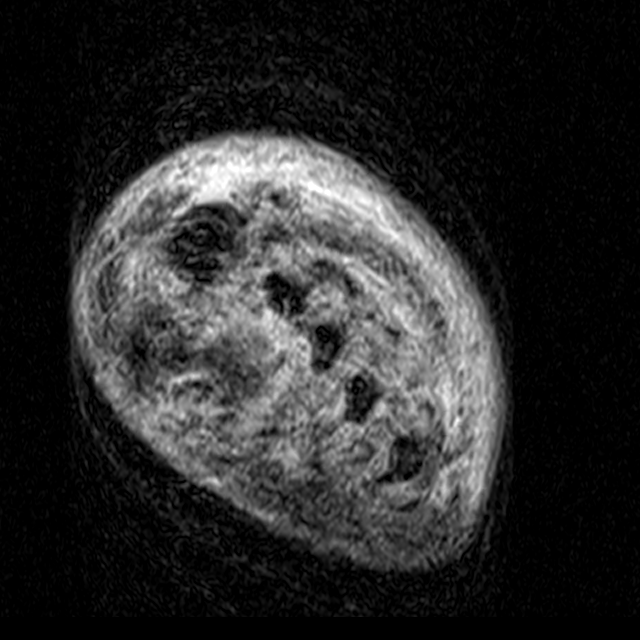
[im 34/40]
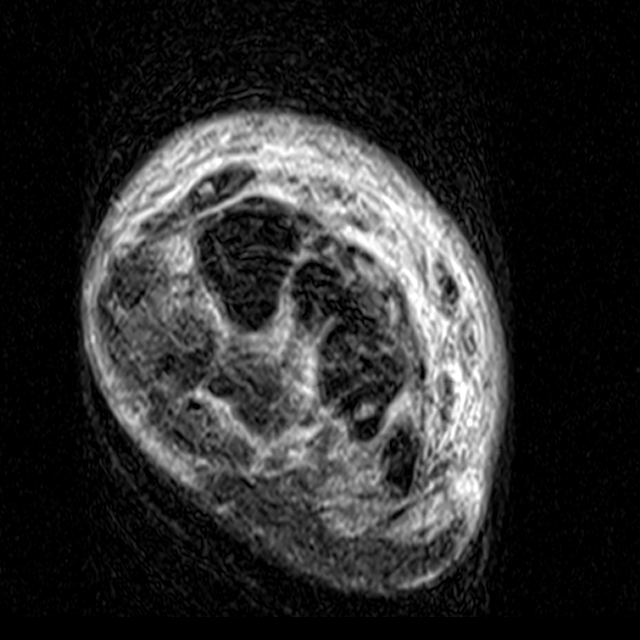

[Series 7: T2 fat-sat · oblique · 3.0mm · 0.38mm/px · 3 of 28 slices shown (2 of 3)]
[im 6/28]
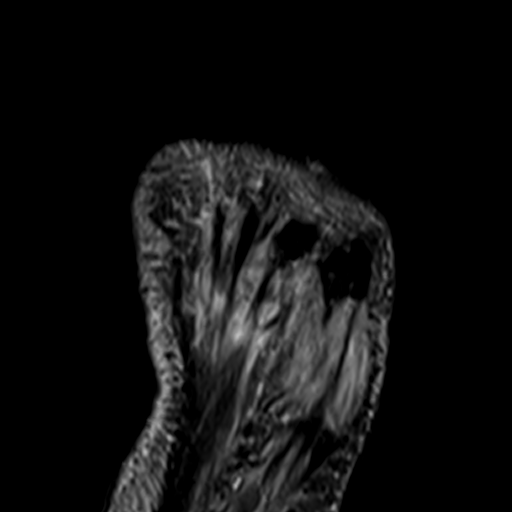
[im 17/28]
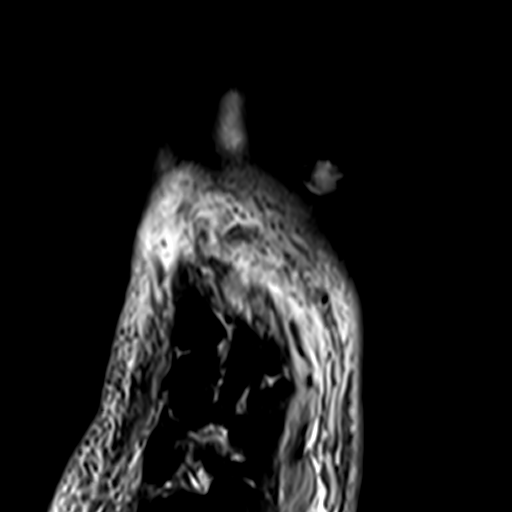
[im 28/28]
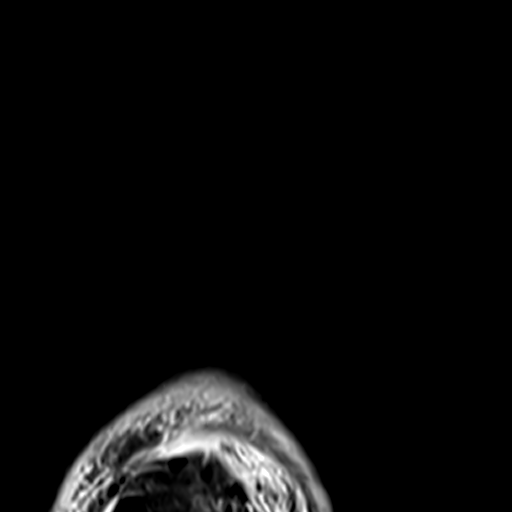

[Series 10: T2 fat-sat · oblique · 3.0mm · 0.52mm/px · 3 of 32 slices shown (3 of 3)]
[im 7/32]
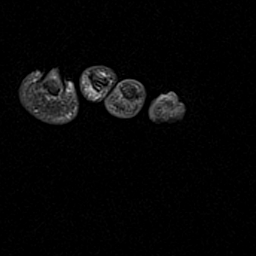
[im 19/32]
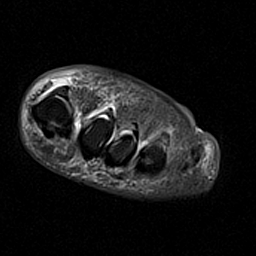
[im 32/32]
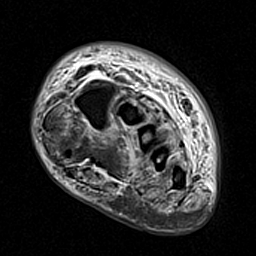

[16 of 40 positions shown; findings below may reference images not displayed]

FINDINGS: Bones/Joint/Cartilage

No marrow signal abnormality. No fracture or dislocation. Normal
alignment. No joint effusion.

Ligaments

Collateral ligaments are intact.

Muscles and Tendons
Flexor and extensor compartment tendons are intact. Increased T2
signal within the intrinsic muscles of the forefoot, nonspecific,
but likely related to diabetic muscle changes.

Soft tissue
Plantar puncture wound at the level of the fourth PIP joint.
Extensive soft tissue swelling of the dorsal forefoot and base of
the toes with skin blistering of the base of the fourth toe. 7 x 5 x
12 mm fluid collection in between the proximal third and fourth
metatarsals. No soft tissue mass.
IMPRESSION: 1. Plantar puncture wound at the level of the fourth PIP joint
complicated by extensive cellulitis of the forefoot.
2. Small 12 mm fluid collection in between the proximal third and
fourth metatarsals. Although this could be an abscess, its location
is deep within the intrinsic muscles of the forefoot, not near the
puncture wound, and it could therefore be a small ganglion cyst.
3. No osteomyelitis.

## 2021-06-19 ENCOUNTER — Ambulatory Visit: Payer: Medicaid Other | Admitting: Nurse Practitioner

## 2021-07-16 ENCOUNTER — Ambulatory Visit (INDEPENDENT_AMBULATORY_CARE_PROVIDER_SITE_OTHER): Payer: 59 | Admitting: *Deleted

## 2021-07-16 DIAGNOSIS — Z5181 Encounter for therapeutic drug level monitoring: Secondary | ICD-10-CM | POA: Diagnosis not present

## 2021-07-16 DIAGNOSIS — Z952 Presence of prosthetic heart valve: Secondary | ICD-10-CM | POA: Diagnosis not present

## 2021-07-16 LAB — POCT INR: INR: 2.2 (ref 2.0–3.0)

## 2021-07-16 NOTE — Patient Instructions (Signed)
Continue warfarin 1 tablet daily Recheck in 6 wk 

## 2021-07-22 ENCOUNTER — Other Ambulatory Visit: Payer: Self-pay | Admitting: Nurse Practitioner

## 2021-07-22 DIAGNOSIS — I10 Essential (primary) hypertension: Secondary | ICD-10-CM

## 2021-07-27 ENCOUNTER — Other Ambulatory Visit: Payer: Self-pay | Admitting: *Deleted

## 2021-07-27 DIAGNOSIS — I10 Essential (primary) hypertension: Secondary | ICD-10-CM

## 2021-07-27 MED ORDER — WARFARIN SODIUM 10 MG PO TABS: ORAL_TABLET | ORAL | 5 refills | Status: DC

## 2021-07-29 NOTE — Progress Notes (Deleted)
Cardiology Office Note  Date: 07/29/2021   ID: Adam Long, DOB 10/29/1975, MRN [id number]  PCP:  Heather Roberts, NP  Cardiologist:  Nona Dell, MD Electrophysiologist:  None   Chief Complaint: 9-month follow-up  History of Present Illness: Adam Long is a 45 y.o. male with a history of HTN, DM2, HLD, morbid obesity, syncope, status post aortic valve replacement, CAD/chest pain.  His last saw Dr. Diona Long on 09/09/2020 for routine visit without any complaints of chest pain, shortness of breath.  He was continuing Coumadin INR was 2.1.  Did not report any spontaneous bleeding problems.  Stopping aspirin was discussed.  He is status post aortic valve replacement in March 2021 for history of severe aortic stenosis.  History of nonobstructive CAD.  Aspirin was stopped.  He was continuing Coumadin.  Lipitor was continued with previous LDL of 79.  His blood pressure was well controlled and he was continuing on lisinopril and Lopressor.  Past Medical History:  Diagnosis Date   Anxiety    Aortic stenosis    Cellulitis and abscess of lower extremity 06/11/2019   Cellulitis of fourth toe of left foot    Cholelithiasis    Coronary artery disease    Nonobstructive CAD (40-50% LAD) 08/2019   Depression    Depression    Phreesia 09/27/2020   Depression    Phreesia 11/01/2020   Diabetes mellitus without complication (HCC)    Phreesia 09/27/2020   Elevated troponin level not due myocardial infarction 11/11/2019   Essential hypertension    Gangrene of toe of left foot (HCC) 07/06/2019   Heart murmur    Phreesia 09/27/2020   Hyperlipidemia    Phreesia 09/27/2020   Hypertension    Phreesia 09/27/2020   Mixed hyperlipidemia    Morbid obesity (HCC)    S/P aortic valve replacement with mechanical valve 12/05/2019   25 mm Carb[employer]ics top hat bileaflet mechanical valve via partial upper hemi-sternotomy   Severe aortic stenosis 09/24/2019   Type 2 diabetes mellitus (HCC)      Past Surgical History:  Procedure Laterality Date   ABDOMINAL AORTOGRAM W/LOWER EXTREMITY N/A 07/06/2019   Procedure: ABDOMINAL AORTOGRAM W/LOWER EXTREMITY;  Surgeon: Sherren Kerns, MD;  Location: [facility];  Service: Cardiovascular;  Laterality: N/A;  Bilateral   AMPUTATION Left 07/09/2019   Procedure: LEFT FOURTH and Fifth TOE AMPUTATION.;  Surgeon: Larina Earthly, MD;  Location: [facility] OR;  Service: Vascular;  Laterality: Left;   AMPUTATION Left 03/11/2021   Procedure: LEFT FOOT 5TH  AND 4TH RAY AMPUTATION;  Surgeon: Nadara Mustard, MD;  Location: [facility] OR;  Service: Orthopedics;  Laterality: Left;   AORTIC VALVE REPLACEMENT N/A 12/05/2019   Procedure: PARTIAL STERNOTOMY FOR AORTIC VALVE REPLACEMENT (AVR), USING CARBOMEDICS SUPRA-ANNULAR TOP HAT ;  Surgeon: Purcell Nails, MD;  Location: [facility] OR;  Service: Open Heart Surgery;  Laterality: N/A;  No neck lines on left   CARDIAC VALVE REPLACEMENT N/A    Phreesia 09/27/2020   IR RADIOLOGY PERIPHERAL GUIDED IV START  10/05/2019   IR US GUIDE VASC ACCESS RIGHT  10/05/2019   MULTIPLE EXTRACTIONS WITH ALVEOLOPLASTY N/A 10/26/2019   Procedure: EXTRACTION OF TOOTH #'S 3, 5-11,19-28,  AND 32 WITH ALVEOLOPLASTY;  Surgeon: Charlynne Pander, DDS;  Location: [facility] OR;  Service: Oral Surgery;  Laterality: N/A;   RIGHT HEART CATH AND CORONARY ANGIOGRAPHY N/A 09/24/2019   Procedure: RIGHT HEART CATH AND CORONARY ANGIOGRAPHY;  Surgeon: Tonny Bollman, MD;  Location:  [facility];  Service: Cardiovascular;  Laterality: N/A;   TEE WITHOUT CARDIOVERSION N/A 12/05/2019   Procedure: TRANSESOPHAGEAL ECHOCARDIOGRAM (TEE);  Surgeon: Purcell Nails, MD;  Location: [facility] OR;  Service: Open Heart Surgery;  Laterality: N/A;    Current Outpatient Medications  Medication Sig Dispense Refill   atorvastatin (LIPITOR) 80 MG tablet Take 1 tablet (80 mg total) by mouth daily. 90 tablet 3   insulin glargine (LANTUS SOLOSTAR) 100 UNIT/ML Solostar Pen Inject 30 Units  into the skin at bedtime. 30 mL 3   lisinopril (ZESTRIL) 20 MG tablet Take 1 tablet by mouth once daily 90 tablet 0   metoprolol tartrate (LOPRESSOR) 25 MG tablet Take 1 tablet by mouth twice daily 180 tablet 0   sertraline (ZOLOFT) 50 MG tablet Take 1 tablet by mouth once daily 90 tablet 1   sitaGLIPtin-metformin (JANUMET) 50-500 MG tablet Take 1 tablet by mouth 2 (two) times daily with a meal. 180 tablet 3   warfarin (COUMADIN) 10 MG tablet Take 1 tablet daily. 45 tablet 5   No current facility-administered medications for this visit.   Allergies:  Pollen extract   Social History: The patient  reports that he has never smoked. He quit smokeless tobacco use about 22 months ago.  His smokeless tobacco use included chew. He reports that he does not currently use alcohol. He reports that he does not use drugs.   Family History: The patient's family history includes Cancer in his mother; Heart disease in his father; Heart murmur in his sister; Hyperlipidemia in his father; Hypertension in his father; Stroke in his father.   ROS:  Please see the history of present illness. Otherwise, complete review of systems is positive for {NONE DEFAULTED:18576}.  All other systems are reviewed and negative.   Physical Exam: VS:  There were no vitals taken for this visit., BMI There is no height or weight on file to calculate BMI.  Wt Readings from Last 3 Encounters:  06/04/21 (!) 374 lb (169.6 kg)  03/16/21 (!) 368 lb (166.9 kg)  03/11/21 (!) 354 lb (160.6 kg)    General: Patient appears comfortable at rest. HEENT: Conjunctiva and lids normal, oropharynx clear with moist mucosa. Neck: Supple, no elevated JVP or carotid bruits, no thyromegaly. Lungs: Clear to auscultation, nonlabored breathing at rest. Cardiac: Regular rate and rhythm, no S3 or significant systolic murmur, no pericardial rub. Abdomen: Soft, nontender, no hepatomegaly, bowel sounds present, no guarding or rebound. Extremities: No pitting  edema, distal pulses 2+. Skin: Warm and dry. Musculoskeletal: No kyphosis. Neuropsychiatric: Alert and oriented x3, affect grossly appropriate.  ECG:  {EKG/Telemetry Strips Reviewed:[id number]}  Recent Labwork: 02/13/2021: Magnesium 1.8 06/04/2021: ALT 18; AST 17; BUN 12; Creatinine, Ser 0.82; Hemoglobin 11.8; Platelets 240; Potassium 4.6; Sodium 136     Component Value Date/Time   CHOL 150 06/04/2021 0909   TRIG 122 06/04/2021 0909   HDL 40 06/04/2021 0909   CHOLHDL 3.4 07/17/2020 0837   VLDL 14 07/17/2020 0837   LDLCALC 88 06/04/2021 0909    Other Studies Reviewed Today:    Echocardiogram 01/22/2020: 1. Left ventricular ejection fraction, by estimation, is 55 to 60%. The  left ventricle has normal function. The left ventricle has no regional  wall motion abnormalities. There is mild left ventricular hypertrophy.  Left ventricular diastolic parameters  are consistent with Grade I diastolic dysfunction (impaired relaxation).   2. Right ventricular systolic function is normal. The right ventricular  size is normal. Tricuspid regurgitation signal is  inadequate for assessing  PA pressure.   3. The mitral valve is abnormal, moderately calcified annulus. Trivial  mitral valve regurgitation.   4. The aortic valve has been repaired/replaced. There is a 25 mm  [employer] Top Hat bileaflet mechanical prosthesis in position. Aortic  valve regurgitation is trivial. Mean gradient 16 mmHg.   5. The inferior vena cava is normal in size with greater than 50%  respiratory variability, suggesting right atrial pressure of 3 mmHg.   ABI 02/16/2021 Summary:  Right: Resting right ankle-brachial index indicates noncompressible right  lower extremity arteries. The right toe-brachial index is normal. ABIs are  unreliable. TBIs are unreliable.   Left: Resting left ankle-brachial index is within normal range. No  evidence of significant left lower extremity arterial disease. The left  toe-brachial  index is normal. ABIs are unreliable. TBIs are unreliable.      Assessment and Plan:  1. S/P AVR (aortic valve replacement)   2. Coronary artery disease involving native coronary artery of native heart without angina pectoris   3. Essential hypertension   4. Mixed hyperlipidemia    1. S/P AVR (aortic valve replacement) ***  2. Coronary artery disease involving native coronary artery of native heart without angina pectoris ***  3. Essential hypertension ***  4. Mixed hyperlipidemia ***   Medication Adjustments/Labs and Tests Ordered: Current medicines are reviewed at length with the patient today.  Concerns regarding medicines are outlined above.   Disposition: Follow-up with ***  Signed, Rennis Harding, NP 07/29/2021 6:05 PM    [facility] [address] [place], [place], [place] [address] Phone: [phone]; Fax: [phone]

## 2021-07-30 ENCOUNTER — Ambulatory Visit: Payer: 59 | Admitting: Family Medicine

## 2021-07-30 DIAGNOSIS — E782 Mixed hyperlipidemia: Secondary | ICD-10-CM

## 2021-07-30 DIAGNOSIS — I10 Essential (primary) hypertension: Secondary | ICD-10-CM

## 2021-07-30 DIAGNOSIS — I2510 Atherosclerotic heart disease of native coronary artery without angina pectoris: Secondary | ICD-10-CM

## 2021-07-30 DIAGNOSIS — Z952 Presence of prosthetic heart valve: Secondary | ICD-10-CM

## 2021-08-27 ENCOUNTER — Other Ambulatory Visit: Payer: Self-pay

## 2021-08-27 ENCOUNTER — Emergency Department (HOSPITAL_COMMUNITY): Payer: 59

## 2021-08-27 ENCOUNTER — Encounter (HOSPITAL_COMMUNITY): Payer: Self-pay

## 2021-08-27 ENCOUNTER — Inpatient Hospital Stay (HOSPITAL_COMMUNITY)
Admission: EM | Admit: 2021-08-27 | Discharge: 2021-09-02 | DRG: 239 | Disposition: A | Discharge status: Rehabilitation Facility | Payer: 59 | Attending: Family Medicine | Admitting: Family Medicine

## 2021-08-27 DIAGNOSIS — I2510 Atherosclerotic heart disease of native coronary artery without angina pectoris: Secondary | ICD-10-CM | POA: Diagnosis present

## 2021-08-27 DIAGNOSIS — E1152 Type 2 diabetes mellitus with diabetic peripheral angiopathy with gangrene: Secondary | ICD-10-CM | POA: Diagnosis present

## 2021-08-27 DIAGNOSIS — L089 Local infection of the skin and subcutaneous tissue, unspecified: Secondary | ICD-10-CM | POA: Diagnosis present

## 2021-08-27 DIAGNOSIS — Z6841 Body Mass Index (BMI) 40.0 and over, adult: Secondary | ICD-10-CM

## 2021-08-27 DIAGNOSIS — Z23 Encounter for immunization: Secondary | ICD-10-CM | POA: Diagnosis not present

## 2021-08-27 DIAGNOSIS — Z794 Long term (current) use of insulin: Secondary | ICD-10-CM

## 2021-08-27 DIAGNOSIS — E11621 Type 2 diabetes mellitus with foot ulcer: Secondary | ICD-10-CM | POA: Diagnosis present

## 2021-08-27 DIAGNOSIS — Z8249 Family history of ischemic heart disease and other diseases of the circulatory system: Secondary | ICD-10-CM

## 2021-08-27 DIAGNOSIS — E782 Mixed hyperlipidemia: Secondary | ICD-10-CM | POA: Diagnosis present

## 2021-08-27 DIAGNOSIS — Z87891 Personal history of nicotine dependence: Secondary | ICD-10-CM | POA: Diagnosis not present

## 2021-08-27 DIAGNOSIS — R7881 Bacteremia: Secondary | ICD-10-CM | POA: Diagnosis not present

## 2021-08-27 DIAGNOSIS — E43 Unspecified severe protein-calorie malnutrition: Secondary | ICD-10-CM

## 2021-08-27 DIAGNOSIS — E66813 Obesity, class 3: Secondary | ICD-10-CM | POA: Diagnosis present

## 2021-08-27 DIAGNOSIS — L02612 Cutaneous abscess of left foot: Secondary | ICD-10-CM | POA: Diagnosis present

## 2021-08-27 DIAGNOSIS — L97529 Non-pressure chronic ulcer of other part of left foot with unspecified severity: Secondary | ICD-10-CM | POA: Diagnosis present

## 2021-08-27 DIAGNOSIS — Z952 Presence of prosthetic heart valve: Secondary | ICD-10-CM | POA: Diagnosis not present

## 2021-08-27 DIAGNOSIS — Z7901 Long term (current) use of anticoagulants: Secondary | ICD-10-CM | POA: Diagnosis not present

## 2021-08-27 DIAGNOSIS — I96 Gangrene, not elsewhere classified: Secondary | ICD-10-CM | POA: Diagnosis present

## 2021-08-27 DIAGNOSIS — E1165 Type 2 diabetes mellitus with hyperglycemia: Secondary | ICD-10-CM | POA: Diagnosis present

## 2021-08-27 DIAGNOSIS — Z79899 Other long term (current) drug therapy: Secondary | ICD-10-CM | POA: Diagnosis not present

## 2021-08-27 DIAGNOSIS — I10 Essential (primary) hypertension: Secondary | ICD-10-CM | POA: Diagnosis present

## 2021-08-27 DIAGNOSIS — Z823 Family history of stroke: Secondary | ICD-10-CM | POA: Diagnosis not present

## 2021-08-27 DIAGNOSIS — Z7984 Long term (current) use of oral hypoglycemic drugs: Secondary | ICD-10-CM | POA: Diagnosis not present

## 2021-08-27 DIAGNOSIS — B9561 Methicillin susceptible Staphylococcus aureus infection as the cause of diseases classified elsewhere: Secondary | ICD-10-CM | POA: Diagnosis not present

## 2021-08-27 DIAGNOSIS — Z20822 Contact with and (suspected) exposure to covid-19: Secondary | ICD-10-CM | POA: Diagnosis present

## 2021-08-27 DIAGNOSIS — L03116 Cellulitis of left lower limb: Secondary | ICD-10-CM | POA: Diagnosis present

## 2021-08-27 DIAGNOSIS — S88112A Complete traumatic amputation at level between knee and ankle, left lower leg, initial encounter: Secondary | ICD-10-CM | POA: Diagnosis not present

## 2021-08-27 DIAGNOSIS — Z4781 Encounter for orthopedic aftercare following surgical amputation: Secondary | ICD-10-CM | POA: Diagnosis not present

## 2021-08-27 DIAGNOSIS — E11628 Type 2 diabetes mellitus with other skin complications: Secondary | ICD-10-CM | POA: Diagnosis present

## 2021-08-27 DIAGNOSIS — I358 Other nonrheumatic aortic valve disorders: Secondary | ICD-10-CM | POA: Diagnosis not present

## 2021-08-27 LAB — COMPREHENSIVE METABOLIC PANEL
ALT: 26 U/L (ref 0–44)
AST: 20 U/L (ref 15–41)
Albumin: 2.7 g/dL — ABNORMAL LOW (ref 3.5–5.0)
Alkaline Phosphatase: 76 U/L (ref 38–126)
Anion gap: 9 (ref 5–15)
BUN: 11 mg/dL (ref 6–20)
CO2: 26 mmol/L (ref 22–32)
Calcium: 8.7 mg/dL — ABNORMAL LOW (ref 8.9–10.3)
Chloride: 97 mmol/L — ABNORMAL LOW (ref 98–111)
Creatinine, Ser: 0.72 mg/dL (ref 0.61–1.24)
GFR, Estimated: >60 mL/min (ref >60)
Glucose, Bld: 212 mg/dL — ABNORMAL HIGH (ref 70–99)
Potassium: 4.3 mmol/L (ref 3.5–5.1)
Sodium: 132 mmol/L — ABNORMAL LOW (ref 135–145)
Total Bilirubin: 0.7 mg/dL (ref 0.3–1.2)
Total Protein: 7.4 g/dL (ref 6.5–8.1)

## 2021-08-27 LAB — RESP PANEL BY RT-PCR (FLU A&B, COVID) ARPGX2
Influenza A by PCR: NEGATIVE
Influenza B by PCR: NEGATIVE
SARS Coronavirus 2 by RT PCR: NEGATIVE

## 2021-08-27 LAB — CBC WITH DIFFERENTIAL/PLATELET
Abs Immature Granulocytes: 0.43 10*3/uL — ABNORMAL HIGH (ref 0.00–0.07)
Basophils Absolute: 0.1 10*3/uL (ref 0.0–0.1)
Basophils Relative: 1 %
Eosinophils Absolute: 0.1 10*3/uL (ref 0.0–0.5)
Eosinophils Relative: 1 %
HCT: 33.1 % — ABNORMAL LOW (ref 39.0–52.0)
Hemoglobin: 10.7 g/dL — ABNORMAL LOW (ref 13.0–17.0)
Immature Granulocytes: 3 %
Lymphocytes Relative: 8 %
Lymphs Abs: 1.1 10*3/uL (ref 0.7–4.0)
MCH: 28.1 pg (ref 26.0–34.0)
MCHC: 32.3 g/dL (ref 30.0–36.0)
MCV: 86.9 fL (ref 80.0–100.0)
Monocytes Absolute: 0.9 10*3/uL (ref 0.1–1.0)
Monocytes Relative: 6 %
Neutro Abs: 12.7 10*3/uL — ABNORMAL HIGH (ref 1.7–7.7)
Neutrophils Relative %: 81 %
Platelets: 266 10*3/uL (ref 150–400)
RBC: 3.81 MIL/uL — ABNORMAL LOW (ref 4.22–5.81)
RDW: 15.4 % (ref 11.5–15.5)
WBC: 15.3 10*3/uL — ABNORMAL HIGH (ref 4.0–10.5)
nRBC: 0 % (ref 0.0–0.2)

## 2021-08-27 LAB — GLUCOSE, CAPILLARY: Glucose-Capillary: 149 mg/dL — ABNORMAL HIGH (ref 70–99)

## 2021-08-27 LAB — PROTIME-INR
INR: 3.2 — ABNORMAL HIGH (ref 0.8–1.2)
Prothrombin Time: 32.7 seconds — ABNORMAL HIGH (ref 11.4–15.2)

## 2021-08-27 MED ORDER — SODIUM CHLORIDE 0.9 % IV SOLN
1.0000 g | Freq: Once | INTRAVENOUS | Status: AC
  Administered 2021-08-27: 1 g via INTRAVENOUS
  Filled 2021-08-27: qty 10

## 2021-08-27 MED ORDER — VANCOMYCIN HCL 2000 MG/400ML IV SOLN
2000.0000 mg | Freq: Once | INTRAVENOUS | Status: AC
  Administered 2021-08-27: 2000 mg via INTRAVENOUS
  Filled 2021-08-27: qty 400

## 2021-08-27 MED ORDER — SODIUM CHLORIDE 0.9 % IV SOLN
2.0000 g | Freq: Three times a day (TID) | INTRAVENOUS | Status: DC
  Administered 2021-08-27 – 2021-08-28 (×2): 2 g via INTRAVENOUS
  Filled 2021-08-27 (×4): qty 2

## 2021-08-27 MED ORDER — VANCOMYCIN HCL 1500 MG/300ML IV SOLN
1500.0000 mg | Freq: Two times a day (BID) | INTRAVENOUS | Status: DC
  Administered 2021-08-28 (×2): 1500 mg via INTRAVENOUS
  Filled 2021-08-27 (×3): qty 300

## 2021-08-27 MED ORDER — INFLUENZA VAC SPLIT QUAD 0.5 ML IM SUSY
0.5000 mL | PREFILLED_SYRINGE | INTRAMUSCULAR | Status: AC
  Administered 2021-08-29: 0.5 mL via INTRAMUSCULAR
  Filled 2021-08-27: qty 0.5

## 2021-08-27 NOTE — ED Notes (Signed)
Report given to [facility]. 

## 2021-08-27 NOTE — Progress Notes (Signed)
Pharmacy Antibiotic Note  Adam Long is a 45 y.o. male admitted on 08/27/2021 with  diabetic foot ulcer .  Pharmacy has been consulted for Vancomycin dosing.  Plan: Vancomycin 2000 mg IV x 1 dose. Vancomycin 1500 mg IV every 12 hours. Monitor labs, c/s, and vanco level as indicated.  Height: 5\' 10"  (177.8 cm) Weight: (!) 172.4 kg (380 lb) IBW/kg (Calculated) : 73  Temp (24hrs), Avg:97.9 F (36.6 C), Min:97.9 F (36.6 C), Max:97.9 F (36.6 C)  Recent Labs  Lab 08/27/21 0950  WBC 15.3*  CREATININE 0.72    Estimated Creatinine Clearance: 186 mL/min (by C-G formula based on SCr of 0.72 mg/dL).    Allergies  Allergen Reactions   Pollen Extract Other (See Comments)    Runny nose, watery eyes, sneezing    Antimicrobials this admission: Vanco 12/1 >> CTX 12/1  Microbiology results: 12/1 BCx: pending   Thank you for allowing pharmacy to be a part of this patient's care.  14/1, PharmD Clinical Pharmacist 08/27/2021 10:52 AM

## 2021-08-27 NOTE — H&P (Signed)
History and Physical  Adam Long SVX:[id number] DOB: 05/10/1976 DOA: 08/27/2021   PCP: Adam Larsson, NP   Patient coming from: Home  Chief Complaint: left foot infection  HPI:  Adam Long is a 45 y.o. male with medical history of diabetes mellitus type 2, mechanical aortic valve replacement on warfarin, coronary disease, hypertension, hyperlipidemia, morbid obesity presenting with left foot pain, drainage, and edema.  Patient states that he noticed some "dead skin" on his left foot about 2 weeks prior to this admission.  He did not think much about it because it was not causing significant pain.  However in the past week he has noted some increasing edema, erythema and drainage which has significantly worsened over the past 2 to 3 days.  He has had some subjective chills but denies any fevers.  He denies any headache, neck pain, chest pain, shortness of breath, vomiting, diarrhea, abdominal pain.  He has some nausea.  Because of his worsening left foot symptoms, he presented for further evaluation. Notably, the patient had a left fourth and fifth ray amputation on 03/11/2021 performed by Dr. Meridee Score.  He is also being followed by vascular surgery.    ED In the emergency department, the patient was afebrile hemodynamically stable with oxygen saturation 96% on room air.  WBC 15.3, hemoglobin 10.7, platelets 261,000.  Sodium 132, potassium 4.3, bicarbonate 26, serum creatinine 0.72.  X-ray of the left foot showed increasing edema and soft tissue gas about the lateral aspect of the left foot.  Assessment/Plan: Left diabetic foot infection -Start vancomycin and cefepime -The case was discussed with orthopedics, Dr. Sharol Given -He reviewed the patient's record and is concerned the patient may need Left BKA -transfer to [facility] -ESR -CRP  S/P mechanical aortic valve -Holding warfarin -Start IV heparin in preparation for surgery  Uncontrolled diabetes mellitus type 2 with  hyperglycemia -06/04/2021 hemoglobin A1c 6.8 -NovoLog sliding scale -holding Janumet -start reduced dose Lantus  Essential hypertension -Holding lisinopril and metoprolol secondary to soft blood pressure  Hyperlipidemia -Continue statin  Morbid obesity -BMI 54.52 -Lifestyle modification        Past Medical History:  Diagnosis Date   Anxiety    Aortic stenosis    Cellulitis and abscess of lower extremity 06/11/2019   Cellulitis of fourth toe of left foot    Cholelithiasis    Coronary artery disease    Nonobstructive CAD (40-50% LAD) 08/2019   Depression    Depression    Phreesia 09/27/2020   Depression    Phreesia 11/01/2020   Diabetes mellitus without complication ([place])    Phreesia 09/27/2020   Elevated troponin level not due myocardial infarction 11/11/2019   Essential hypertension    Gangrene of toe of left foot ([place]) 07/06/2019   Heart murmur    Phreesia 09/27/2020   Hyperlipidemia    Phreesia 09/27/2020   Hypertension    Phreesia 09/27/2020   Mixed hyperlipidemia    Morbid obesity ([name])    S/P aortic valve replacement with mechanical valve 12/05/2019   25 mm Carbomedics top hat bileaflet mechanical valve via partial upper hemi-sternotomy   Severe aortic stenosis 09/24/2019   Type 2 diabetes mellitus ([place])    Past Surgical History:  Procedure Laterality Date   ABDOMINAL AORTOGRAM W/LOWER EXTREMITY N/A 07/06/2019   Procedure: ABDOMINAL AORTOGRAM W/LOWER EXTREMITY;  Surgeon: Elam Dutch, MD;  Location: [place] [facility];  Service: Cardiovascular;  Laterality: N/A;  Bilateral   AMPUTATION Left  07/09/2019   Procedure: LEFT FOURTH and Fifth TOE AMPUTATION.;  Surgeon: Rosetta Posner, MD;  Location: [place];  Service: Vascular;  Laterality: Left;   AMPUTATION Left 03/11/2021   Procedure: LEFT FOOT 5TH  AND 4TH RAY AMPUTATION;  Surgeon: Newt Minion, MD;  Location: [place];  Service: Orthopedics;  Laterality: Left;   AORTIC VALVE REPLACEMENT N/A 12/05/2019    Procedure: PARTIAL STERNOTOMY FOR AORTIC VALVE REPLACEMENT (AVR), USING CARBOMEDICS SUPRA-ANNULAR TOP HAT 25MM;  Surgeon: Rexene Alberts, MD;  Location: [place];  Service: Open Heart Surgery;  Laterality: N/A;  No neck lines on left   CARDIAC VALVE REPLACEMENT N/A    Phreesia 09/27/2020   IR RADIOLOGY PERIPHERAL GUIDED IV START  10/05/2019   IR US GUIDE VASC ACCESS RIGHT  10/05/2019   MULTIPLE EXTRACTIONS WITH ALVEOLOPLASTY N/A 10/26/2019   Procedure: EXTRACTION OF TOOTH #'S 3, 5-11,19-28,  AND 32 WITH ALVEOLOPLASTY;  Surgeon: Lenn Cal, DDS;  Location: [place];  Service: Oral Surgery;  Laterality: N/A;   RIGHT HEART CATH AND CORONARY ANGIOGRAPHY N/A 09/24/2019   Procedure: RIGHT HEART CATH AND CORONARY ANGIOGRAPHY;  Surgeon: Sherren Mocha, MD;  Location: [place] [facility];  Service: Cardiovascular;  Laterality: N/A;   TEE WITHOUT CARDIOVERSION N/A 12/05/2019   Procedure: TRANSESOPHAGEAL ECHOCARDIOGRAM (TEE);  Surgeon: Rexene Alberts, MD;  Location: [place];  Service: Open Heart Surgery;  Laterality: N/A;   Social History:  reports that he has never smoked. He quit smokeless tobacco use about 22 months ago.  His smokeless tobacco use included chew. He reports that he does not currently use alcohol. He reports that he does not use drugs.   Family History  Problem Relation Age of Onset   Cancer Mother        Brain   Heart disease Father    Hyperlipidemia Father    Hypertension Father    Stroke Father    Heart murmur Sister      Allergies  Allergen Reactions   Pollen Extract Other (See Comments)    Runny nose, watery eyes, sneezing     Prior to Admission medications   Medication Sig Start Date End Date Taking? Authorizing Provider  atorvastatin (LIPITOR) 80 MG tablet Take 1 tablet (80 mg total) by mouth daily. 11/04/20  Yes Adam Larsson, NP  insulin glargine (LANTUS SOLOSTAR) 100 UNIT/ML Solostar Pen Inject 30 Units into the skin at bedtime. 12/17/20  Yes Brita Romp, NP   lisinopril (ZESTRIL) 20 MG tablet Take 1 tablet by mouth once daily 07/22/21  Yes Fayrene Helper, MD  metoprolol tartrate (LOPRESSOR) 25 MG tablet Take 1 tablet by mouth twice daily 07/22/21  Yes Fayrene Helper, MD  sertraline (ZOLOFT) 50 MG tablet Take 1 tablet by mouth once daily 03/09/21  Yes Adam Larsson, NP  sitaGLIPtin-metformin (JANUMET) 50-500 MG tablet Take 1 tablet by mouth 2 (two) times daily with a meal. 12/17/20  Yes Brita Romp, NP  warfarin (COUMADIN) 10 MG tablet Take 1 tablet daily. 07/27/21  Yes Satira Sark, MD    Review of Systems:  Constitutional:  No weight loss, night sweats, Fevers, chills, fatigue.  Head&Eyes: No headache.  No vision loss.  No eye pain or scotoma ENT:  No Difficulty swallowing,Tooth/dental problems,Sore throat,  No ear ache, post nasal drip,  Cardio-vascular:  No chest pain, Orthopnea, PND, swelling in lower extremities,  dizziness, palpitations  GI:  No  abdominal pain, nausea, vomiting, diarrhea, loss of  appetite, hematochezia, melena, heartburn, indigestion, Resp:  No shortness of breath with exertion or at rest. No cough. No coughing up of blood .No wheezing.No chest wall deformity  Skin:  no rash or lesions.  GU:  no dysuria, change in color of urine, no urgency or frequency. No flank pain.  Musculoskeletal:  No joint pain or swelling. No decreased range of motion. No back pain.  Psych:  No change in mood or affect. No depression or anxiety. Neurologic: No headache, no dysesthesia, no focal weakness, no vision loss. No syncope  Physical Exam: Vitals:   08/27/21 0853 08/27/21 0854 08/27/21 0900 08/27/21 1100  BP:  (!) 151/75 (!) 143/79 (!) 110/46  Pulse:  90 87 83  Resp:  '17 20 18  ' Temp:  97.9 F (36.6 C)    TempSrc:  Oral    SpO2:  100% 100% 96%  Weight: (!) 172.4 kg     Height: '5\' 10"'  (1.778 m)      General:  A&O x 3, NAD, nontoxic, pleasant/cooperative Head/Eye: No conjunctival hemorrhage, no  icterus, [place]/AT, No nystagmus ENT:  No icterus,  No thrush, good dentition, no pharyngeal exudate Neck:  No masses, no lymphadenpathy, no bruits CV:  RRR, no rub, no gallop, no S3 Lung:  bibasilar crackles. No wheeze Abdomen: soft/NT, +BS, nondistended, no peritoneal signs Ext: 3 + LLE edema.  See pictures beloa Neuro: CNII-XII intact, strength 4/5 in bilateral upper and lower extremities, no dysmetria       Labs on Admission:  Basic Metabolic Panel: Recent Labs  Lab 08/27/21 0950  NA 132*  K 4.3  CL 97*  CO2 26  GLUCOSE 212*  BUN 11  CREATININE 0.72  CALCIUM 8.7*   Liver Function Tests: Recent Labs  Lab 08/27/21 0950  AST 20  ALT 26  ALKPHOS 76  BILITOT 0.7  PROT 7.4  ALBUMIN 2.7*   No results for input(s): LIPASE, AMYLASE in the last 168 hours. No results for input(s): AMMONIA in the last 168 hours. CBC: Recent Labs  Lab 08/27/21 0950  WBC 15.3*  NEUTROABS 12.7*  HGB 10.7*  HCT 33.1*  MCV 86.9  PLT 266   Coagulation Profile: No results for input(s): INR, PROTIME in the last 168 hours. Cardiac Enzymes: No results for input(s): CKTOTAL, CKMB, CKMBINDEX, TROPONINI in the last 168 hours. BNP: Invalid input(s): POCBNP CBG: No results for input(s): GLUCAP in the last 168 hours. Urine analysis:    Component Value Date/Time   COLORURINE STRAW (A) 12/03/2019 1133   APPEARANCEUR CLEAR 12/03/2019 1133   LABSPEC 1.006 12/03/2019 1133   PHURINE 6.0 12/03/2019 1133   GLUCOSEU NEGATIVE 12/03/2019 1133   HGBUR NEGATIVE 12/03/2019 1133   BILIRUBINUR NEGATIVE 12/03/2019 1133   KETONESUR NEGATIVE 12/03/2019 1133   PROTEINUR NEGATIVE 12/03/2019 1133   UROBILINOGEN 0.2 08/31/2011 1727   NITRITE NEGATIVE 12/03/2019 1133   LEUKOCYTESUR NEGATIVE 12/03/2019 1133   Sepsis Labs: '@LABRCNTIP' (procalcitonin:4,lacticidven:4) ) Recent Results (from the past 240 hour(s))  Resp Panel by RT-PCR (Flu A&B, Covid) Nasopharyngeal Swab     Status: None   Collection Time:  08/27/21 10:03 AM   Specimen: Nasopharyngeal Swab; Nasopharyngeal(NP) swabs in vial transport medium  Result Value Ref Range Status   SARS Coronavirus 2 by RT PCR NEGATIVE NEGATIVE Final    Comment: (NOTE) SARS-CoV-2 target nucleic acids are NOT DETECTED.  The SARS-CoV-2 RNA is generally detectable in upper respiratory specimens during the acute phase of infection. The lowest concentration of SARS-CoV-2 viral copies this assay  can detect is 138 copies/mL. A negative result does not preclude SARS-Cov-2 infection and should not be used as the sole basis for treatment or other patient management decisions. A negative result may occur with  improper specimen collection/handling, submission of specimen other than nasopharyngeal swab, presence of viral mutation(s) within the areas targeted by this assay, and inadequate number of viral copies(<138 copies/mL). A negative result must be combined with clinical observations, patient history, and epidemiological information. The expected result is Negative.  Fact Sheet for Patients:  EntrepreneurPulse.com.au  Fact Sheet for Healthcare Providers:  IncredibleEmployment.be  This test is no t yet approved or cleared by the [place] FDA and  has been authorized for detection and/or diagnosis of SARS-CoV-2 by FDA under an Emergency Use Authorization (EUA). This EUA will remain  in effect (meaning this test can be used) for the duration of the COVID-19 declaration under Section 564(b)(1) of the Act, 21 U.S.C.section 360bbb-3(b)(1), unless the authorization is terminated  or revoked sooner.       Influenza A by PCR NEGATIVE NEGATIVE Final   Influenza B by PCR NEGATIVE NEGATIVE Final    Comment: (NOTE) The Xpert Xpress SARS-CoV-2/FLU/RSV plus assay is intended as an aid in the diagnosis of influenza from Nasopharyngeal swab specimens and should not be used as a sole basis for treatment. Nasal washings  and aspirates are unacceptable for Xpert Xpress SARS-CoV-2/FLU/RSV testing.  Fact Sheet for Patients: EntrepreneurPulse.com.au  Fact Sheet for Healthcare Providers: IncredibleEmployment.be  This test is not yet approved or cleared by the [place] FDA and has been authorized for detection and/or diagnosis of SARS-CoV-2 by FDA under an Emergency Use Authorization (EUA). This EUA will remain in effect (meaning this test can be used) for the duration of the COVID-19 declaration under Section 564(b)(1) of the Act, 21 U.S.C. section 360bbb-3(b)(1), unless the authorization is terminated or revoked.  Performed at [facility], [address], [place], [place] [address]   Culture, blood (routine x 2)     Status: None (Preliminary result)   Collection Time: 08/27/21 10:28 AM   Specimen: Left Antecubital; Blood  Result Value Ref Range Status   Specimen Description LEFT ANTECUBITAL  Final   Special Requests   Final    BOTTLES DRAWN AEROBIC AND ANAEROBIC Blood Culture adequate volume Performed at [facility], [address], [place], [place] [address]    Culture PENDING  Incomplete   Report Status PENDING  Incomplete  Culture, blood (routine x 2)     Status: None (Preliminary result)   Collection Time: 08/27/21 10:40 AM   Specimen: Right Antecubital; Blood  Result Value Ref Range Status   Specimen Description RIGHT ANTECUBITAL  Final   Special Requests   Final    BOTTLES DRAWN AEROBIC AND ANAEROBIC Blood Culture results may not be optimal due to an excessive volume of blood received in culture bottles Performed at [facility], [address], [place],  [address]    Culture PENDING  Incomplete   Report Status PENDING  Incomplete     Radiological Exams on Admission: DG Foot Complete Left  Result Date: 08/27/2021 CLINICAL DATA:  Wound EXAM: LEFT FOOT - COMPLETE 3+ VIEW COMPARISON:  Radiograph dated May 13, 2021 FINDINGS: Prior  amputations of the fourth and fifth toes with heterotopic ossification. Osseous deformities with heterotopic calcification of the second and third metatarsals due to prior fractures. No definite areas of osseous destruction. Increased soft tissue swelling and new soft tissue gas of the lateral left foot.  IMPRESSION: 1. Increased soft tissue swelling and new soft tissue gas of the lateral left foot, findings are concerning for infection with gas-forming organism. 2. No definite osteomyelitis, although chronic changes somewhat limited evaluation. IF there is clinical concern, consider MRI. Electronically Signed   By: Yetta Glassman M.D.   On: 08/27/2021 10:24        Time spent:60 minutes Code Status:   FULL Family Communication:  No Family at bedside Disposition Plan: expect 2-3 day hospitalization Consults called: Ortho--Dr. Sharol Given  DVT Prophylaxis: warfarin>>IV heparin  Orson Eva, DO  [facility] Pager [phone]  If 7PM-7AM, please contact night-coverage [email or web address] Password [email or web address] 08/27/2021, 1:05 PM

## 2021-08-27 NOTE — Progress Notes (Signed)
ANTICOAGULATION CONSULT NOTE - Initial Consult  Pharmacy Consult for heparin Indication: mechanical aortic valve--bridge for warfarin--needs Left BKA  Allergies  Allergen Reactions   Pollen Extract Other (See Comments)    Runny nose, watery eyes, sneezing    Patient Measurements: Height: 5\' 10"  (177.8 cm) Weight: (!) 172.4 kg (380 lb) IBW/kg (Calculated) : 73 Heparin Dosing Weight:   Vital Signs: Temp: 97.9 F (36.6 C) (12/01 0854) Temp Source: Oral (12/01 0854) BP: 153/95 (12/01 1300) Pulse Rate: 86 (12/01 1300)  Labs: Recent Labs    08/27/21 0950 08/27/21 0952  HGB 10.7*  --   HCT 33.1*  --   PLT 266  --   LABPROT  --  32.7*  INR  --  3.2*  CREATININE 0.72  --     Estimated Creatinine Clearance: 186 mL/min (by C-G formula based on SCr of 0.72 mg/dL).   Medical History: Past Medical History:  Diagnosis Date   Anxiety    Aortic stenosis    Cellulitis and abscess of lower extremity 06/11/2019   Cellulitis of fourth toe of left foot    Cholelithiasis    Coronary artery disease    Nonobstructive CAD (40-50% LAD) 08/2019   Depression    Depression    Phreesia 09/27/2020   Depression    Phreesia 11/01/2020   Diabetes mellitus without complication ([facility])    Phreesia 09/27/2020   Elevated troponin level not due myocardial infarction 11/11/2019   Essential hypertension    Gangrene of toe of left foot ([facility]) 07/06/2019   Heart murmur    Phreesia 09/27/2020   Hyperlipidemia    Phreesia 09/27/2020   Hypertension    Phreesia 09/27/2020   Mixed hyperlipidemia    Morbid obesity ([facility])    S/P aortic valve replacement with mechanical valve 12/05/2019   25 mm Carbomedics top hat bileaflet mechanical valve via partial upper hemi-sternotomy   Severe aortic stenosis 09/24/2019   Type 2 diabetes mellitus ([facility])     Medications:  (Not in a hospital admission)   Assessment: Pharmacy consulted to dose heparin in patient with mechanical aortic valve.  Currently holding  warfarin due to needing left BKA.  Current INR 3.2 with last dose of warfarin 11/29 @ 1800.  Goal of Therapy:  Heparin level 0.3-0.7 units/ml Monitor platelets by anticoagulation protocol: Yes   Plan:  Hold warfarin Start heparin infusion when INR < 2.0.   12/29, PharmD Clinical Pharmacist 08/27/2021 1:29 PM

## 2021-08-27 NOTE — ED Triage Notes (Signed)
Pt with complaints of left foot diabetic ulcer. States it has been there for a week and is not healing.

## 2021-08-27 NOTE — ED Provider Notes (Signed)
[facility] EMERGENCY DEPARTMENT Provider Note   CSN: [id number] Arrival date & time: 08/27/21  6720     History Chief Complaint  Patient presents with   Skin Ulcer    Left    Adam Long is a 45 y.o. male with a past medical history of diabetes, hypertension who presents to the ED complaining of worsening left foot ulcer onset 1 week.  Patient is compliant with his medications and primary care appointments.  He has associated left foot pain, drainage, subjective fever, chills.  He has tried wearing a postop boot and wrapping the area with no relief of his symptoms.  Patient denies chest pain, shortness of breath, abdominal pain, nausea, vomiting, drainage, gait issue, numbness, tingling.    Per patient chart review: Patient was evaluated by Dr. Due to and had left fifth and fourth toe amputation completed on 03/11/2021.  Patient has had adequate follow-up with Ortho since.  Patient also followed by vascular surgery.  The history is provided by the patient. No language interpreter was used.      Past Medical History:  Diagnosis Date   Anxiety    Aortic stenosis    Cellulitis and abscess of lower extremity 06/11/2019   Cellulitis of fourth toe of left foot    Cholelithiasis    Coronary artery disease    Nonobstructive CAD (40-50% LAD) 08/2019   Depression    Depression    Phreesia 09/27/2020   Depression    Phreesia 11/01/2020   Diabetes mellitus without complication (HCC)    Phreesia 09/27/2020   Elevated troponin level not due myocardial infarction 11/11/2019   Essential hypertension    Gangrene of toe of left foot (HCC) 07/06/2019   Heart murmur    Phreesia 09/27/2020   Hyperlipidemia    Phreesia 09/27/2020   Hypertension    Phreesia 09/27/2020   Mixed hyperlipidemia    Morbid obesity (HCC)    S/P aortic valve replacement with mechanical valve 12/05/2019   25 mm Carbomedics top hat bileaflet mechanical valve via partial upper hemi-sternotomy   Severe aortic  stenosis 09/24/2019   Type 2 diabetes mellitus [facility])     Patient Active Problem List   Diagnosis Date Noted   Ulcer of left foot (HCC)    Subacute osteomyelitis, left ankle and foot (HCC)    Diabetic foot infection (HCC) 02/14/2021   Diabetic ulcer of left foot associated with type 1 diabetes mellitus, limited to breakdown of skin (HCC)    Leukocytosis 02/13/2021   Hyponatremia 02/13/2021   Normocytic anemia 02/13/2021   Hyperglycemia due to diabetes mellitus (HCC) 02/13/2021   Open wound of left foot 02/12/2021   Left leg swelling 02/02/2021   History of complete ray amputation of fifth toe of left foot (HCC) 11/04/2020   Immunization due 11/04/2020   Anxiety 09/30/2020   Preventative health care 12/13/2019   S/P aortic valve replacement with mechanical valve 12/05/2019   S/P AVR (aortic valve replacement) 12/05/2019   Syncope 11/10/2019   Personal history of noncompliance with medical treatment, presenting hazards to health 03/16/2018   Uncontrolled type 2 diabetes mellitus with complication 10/12/2015   Hyperlipidemia 10/12/2015   Morbid obesity (HCC) 10/12/2015   Chest pain 07/02/2013   HTN (hypertension) 07/02/2013    Past Surgical History:  Procedure Laterality Date   ABDOMINAL AORTOGRAM W/LOWER EXTREMITY N/A 07/06/2019   Procedure: ABDOMINAL AORTOGRAM W/LOWER EXTREMITY;  Surgeon: Sherren Kerns, MD;  Location: [facility] INVASIVE CV LAB;  Service: Cardiovascular;  Laterality: N/A;  Bilateral   AMPUTATION Left 07/09/2019   Procedure: LEFT FOURTH and Fifth TOE AMPUTATION.;  Surgeon: Larina Earthly, MD;  Location: [facility] OR;  Service: Vascular;  Laterality: Left;   AMPUTATION Left 03/11/2021   Procedure: LEFT FOOT 5TH  AND 4TH RAY AMPUTATION;  Surgeon: Nadara Mustard, MD;  Location: [facility] OR;  Service: Orthopedics;  Laterality: Left;   AORTIC VALVE REPLACEMENT N/A 12/05/2019   Procedure: PARTIAL STERNOTOMY FOR AORTIC VALVE REPLACEMENT (AVR), USING CARBOMEDICS SUPRA-ANNULAR TOP HAT ;   Surgeon: Purcell Nails, MD;  Location: [facility] OR;  Service: Open Heart Surgery;  Laterality: N/A;  No neck lines on left   CARDIAC VALVE REPLACEMENT N/A    Phreesia 09/27/2020   IR RADIOLOGY PERIPHERAL GUIDED IV START  10/05/2019   IR US GUIDE VASC ACCESS RIGHT  10/05/2019   MULTIPLE EXTRACTIONS WITH ALVEOLOPLASTY N/A 10/26/2019   Procedure: EXTRACTION OF TOOTH #'S 3, 5-11,19-28,  AND 32 WITH ALVEOLOPLASTY;  Surgeon: Charlynne Pander, DDS;  Location: [facility] OR;  Service: Oral Surgery;  Laterality: N/A;   RIGHT HEART CATH AND CORONARY ANGIOGRAPHY N/A 09/24/2019   Procedure: RIGHT HEART CATH AND CORONARY ANGIOGRAPHY;  Surgeon: Tonny Bollman, MD;  Location[facility] INVASIVE CV LAB;  Service: Cardiovascular;  Laterality: N/A;   TEE WITHOUT CARDIOVERSION N/A 12/05/2019   Procedure: TRANSESOPHAGEAL ECHOCARDIOGRAM (TEE);  Surgeon: Purcell Nails, MD;  Location: [facility] OR;  Service: Open Heart Surgery;  Laterality: N/A;       Family History  Problem Relation Age of Onset   Cancer Mother        Brain   Heart disease Father    Hyperlipidemia Father    Hypertension Father    Stroke Father    Heart murmur Sister     Social History   Tobacco Use   Smoking status: Never   Smokeless tobacco: Former    Types: Chew    Quit date: 2021  Vaping Use   Vaping Use: Never used  Substance Use Topics   Alcohol use: Not Currently    Comment: occasionally   Drug use: No    Home Medications Prior to Admission medications   Medication Sig Start Date End Date Taking? Authorizing Provider  atorvastatin (LIPITOR) 80 MG tablet Take 1 tablet (80 mg total) by mouth daily. 11/04/20  Yes Heather Roberts, NP  insulin glargine (LANTUS SOLOSTAR) 100 UNIT/ML Solostar Pen Inject 30 Units into the skin at bedtime. 12/17/20  Yes Dani Gobble, NP  lisinopril (ZESTRIL) 20 MG tablet Take 1 tablet by mouth once daily 07/22/21  Yes Kerri Perches, MD  metoprolol tartrate (LOPRESSOR) 25 MG tablet Take 1 tablet by mouth twice  daily 07/22/21  Yes Kerri Perches, MD  sertraline (ZOLOFT) 50 MG tablet Take 1 tablet by mouth once daily 03/09/21  Yes Heather Roberts, NP  sitaGLIPtin-metformin (JANUMET) 50-500 MG tablet Take 1 tablet by mouth 2 (two) times daily with a meal. 12/17/20  Yes Dani Gobble, NP  warfarin (COUMADIN) 10 MG tablet Take 1 tablet daily. 07/27/21  Yes Jonelle Sidle, MD    Allergies    Pollen extract  Review of Systems   Review of Systems  Constitutional:  Positive for chills and fever (subjective).  Respiratory:  Negative for shortness of breath.   Cardiovascular:  Negative for chest pain.  Gastrointestinal:  Negative for abdominal pain, nausea and vomiting.  Musculoskeletal:  Positive for arthralgias. Negative for gait problem and joint swelling.  Skin:  Negative  for color change and rash.       +Ulcer to left foot  All other systems reviewed and are negative.  Physical Exam Updated Vital Signs BP (!) 151/75 (BP Location: Left Arm)   Pulse 90   Temp 97.9 F (36.6 C) (Oral)   Resp 17   Ht 5\' 10"  (1.778 m)   Wt (!) 172.4 kg   SpO2 100%   BMI 54.52 kg/m   Physical Exam Vitals and nursing note reviewed.  Constitutional:      General: He is not in acute distress.    Appearance: He is not diaphoretic.  HENT:     Head: Normocephalic and atraumatic.     Mouth/Throat:     Pharynx: No oropharyngeal exudate.  Eyes:     General: No scleral icterus.    Conjunctiva/sclera: Conjunctivae normal.  Cardiovascular:     Rate and Rhythm: Normal rate and regular rhythm.     Pulses: Normal pulses.     Heart sounds: Normal heart sounds.  Pulmonary:     Effort: Pulmonary effort is normal. No respiratory distress.     Breath sounds: Normal breath sounds. No wheezing.  Abdominal:     General: Bowel sounds are normal.     Palpations: Abdomen is soft. There is no mass.     Tenderness: There is no abdominal tenderness. There is no guarding or rebound.  Musculoskeletal:         General: Normal range of motion.     Cervical back: Normal range of motion and neck supple.     Comments: Ulceration noted to left plantar surface.  No surrounding erythema.  Moderate edema noted to the left foot.  Healed surgical site of left fourth and fifth toe.  Malodorous drainage noted from left foot ulcer.  Sensation intact to bilateral lower extremities.  See picture below.  Skin:    General: Skin is warm and dry.  Neurological:     Mental Status: He is alert.  Psychiatric:        Behavior: Behavior normal.        ED Results / Procedures / Treatments   Labs (all labs ordered are listed, but only abnormal results are displayed) Labs Reviewed  COMPREHENSIVE METABOLIC PANEL - Abnormal; Notable for the following components:      Result Value   Sodium 132 (*)    Chloride 97 (*)    Glucose, Bld 212 (*)    Calcium 8.7 (*)    Albumin 2.7 (*)    All other components within normal limits  CBC WITH DIFFERENTIAL/PLATELET - Abnormal; Notable for the following components:   WBC 15.3 (*)    RBC 3.81 (*)    Hemoglobin 10.7 (*)    HCT 33.1 (*)    Neutro Abs 12.7 (*)    Abs Immature Granulocytes 0.43 (*)    All other components within normal limits  PROTIME-INR - Abnormal; Notable for the following components:   Prothrombin Time 32.7 (*)    INR 3.2 (*)    All other components within normal limits  CULTURE, BLOOD (ROUTINE X 2)  CULTURE, BLOOD (ROUTINE X 2)  RESP PANEL BY RT-PCR (FLU A&B, COVID) ARPGX2  CREATININE, SERUM  PROTIME-INR    EKG None  Radiology DG Foot Complete Left  Result Date: 08/27/2021 CLINICAL DATA:  Wound EXAM: LEFT FOOT - COMPLETE 3+ VIEW COMPARISON:  Radiograph dated May 13, 2021 FINDINGS: Prior amputations of the fourth and fifth toes with heterotopic ossification. Osseous deformities with  heterotopic calcification of the second and third metatarsals due to prior fractures. No definite areas of osseous destruction. Increased soft tissue swelling and  new soft tissue gas of the lateral left foot. IMPRESSION: 1. Increased soft tissue swelling and new soft tissue gas of the lateral left foot, findings are concerning for infection with gas-forming organism. 2. No definite osteomyelitis, although chronic changes somewhat limited evaluation. IF there is clinical concern, consider MRI. Electronically Signed   By: Allegra Lai M.D.   On: 08/27/2021 10:24    Procedures Procedures   Medications Ordered in ED Medications  vancomycin (VANCOREADY) IVPB 1500 mg/300 mL (has no administration in time range)  ceFEPIme (MAXIPIME) 2 g in sodium chloride 0.9 % 100 mL IVPB (2 g Intravenous New Bag/Given 08/27/21 1722)  cefTRIAXone (ROCEPHIN) 1 g in sodium chloride 0.9 % 100 mL IVPB (0 g Intravenous Stopped 08/27/21 1215)  vancomycin (VANCOREADY) IVPB 2000 mg/400 mL (0 mg Intravenous Stopped 08/27/21 1444)    ED Course  I have reviewed the triage vital signs and the nursing notes.  Pertinent labs & imaging results that were available during my care of the patient were reviewed by me and considered in my medical decision making (see chart for details).  Clinical Course as of 08/27/21 1801  Thu Aug 27, 2021  1234 Consult with hospitalist, Dr. Onalee Hua Tat who will admit the patient at this time. Recommended calling Ortho, Dr. Lajoyce Corners to notify of admission.  [SB]    Clinical Course User Index [SB] Kerrion Kemppainen A, PA-C   MDM Rules/Calculators/A&P                         Patient presents to the ED with left foot diabetic ulcer x1 week.  Patient is compliant with his medications.  He is compliant with primary care visits.  On exam patient with moderate ulceration noted to left plantar surface with malodorous drainage.  Moderate edema noted to left foot.  Otherwise no acute cardiovascular, respiratory, abdominal exam findings.  Labs obtained showed elevated WBC at 15.3, otherwise CMP unremarkable.  COVID and flu swab negative.  Left foot x-ray showed increased soft  tissue swelling and soft tissue gas in the lateral left foot, concerning for infection with gas-forming organism.  No definite osteomyelitis noted..  Differential diagnosis includes osteomyelitis or diabetic foot ulcer. Started prophylactic vancomycin and Rocephin in the ED.  Blood cultures obtained with results pending.  Patient has established care with Dr. Lajoyce Corners who performed left fourth and fifth toe amputation on 03/11/2021.  Patient also established with vascular surgery.  Patient presentation concerning for osteomyelitis in the setting of extensive diabetic foot ulcer.  Recommend admission for further evaluation and management of patient's symptoms.  Consult with hospitalist Dr. Arbutus Leas who will admit patient at this time.    Discussed with patient that he will be admitted to the hospital, patient agreeable.  Discussed case with attending, Dr. Jacqulyn Bath who agrees with admission at this time.    Final Clinical Impression(s) / ED Diagnoses Final diagnoses:  Ulcer of left foot, unspecified ulcer stage [facility])    Rx / DC Orders ED Discharge Orders     None        Sathvik Tiedt A, PA-C 08/27/21 1802    Maia Plan, MD 08/31/21 1027

## 2021-08-28 ENCOUNTER — Other Ambulatory Visit (HOSPITAL_COMMUNITY): Payer: 59

## 2021-08-28 ENCOUNTER — Inpatient Hospital Stay (HOSPITAL_COMMUNITY): Payer: 59 | Admitting: Anesthesiology

## 2021-08-28 ENCOUNTER — Encounter (HOSPITAL_COMMUNITY)
Admission: EM | Disposition: A | Discharge status: Rehabilitation Facility | Payer: Self-pay | Source: Home / Self Care | Attending: Internal Medicine

## 2021-08-28 ENCOUNTER — Encounter (HOSPITAL_COMMUNITY): Payer: Self-pay | Admitting: Internal Medicine

## 2021-08-28 DIAGNOSIS — E43 Unspecified severe protein-calorie malnutrition: Secondary | ICD-10-CM | POA: Diagnosis not present

## 2021-08-28 DIAGNOSIS — Z952 Presence of prosthetic heart valve: Secondary | ICD-10-CM | POA: Diagnosis not present

## 2021-08-28 DIAGNOSIS — R7881 Bacteremia: Secondary | ICD-10-CM

## 2021-08-28 DIAGNOSIS — I10 Essential (primary) hypertension: Secondary | ICD-10-CM

## 2021-08-28 DIAGNOSIS — L97529 Non-pressure chronic ulcer of other part of left foot with unspecified severity: Secondary | ICD-10-CM

## 2021-08-28 DIAGNOSIS — E11628 Type 2 diabetes mellitus with other skin complications: Secondary | ICD-10-CM

## 2021-08-28 DIAGNOSIS — L089 Local infection of the skin and subcutaneous tissue, unspecified: Secondary | ICD-10-CM | POA: Diagnosis not present

## 2021-08-28 DIAGNOSIS — B9561 Methicillin susceptible Staphylococcus aureus infection as the cause of diseases classified elsewhere: Secondary | ICD-10-CM

## 2021-08-28 HISTORY — PX: AMPUTATION: SHX166

## 2021-08-28 LAB — CBC
HCT: 34.3 % — ABNORMAL LOW (ref 39.0–52.0)
Hemoglobin: 11.1 g/dL — ABNORMAL LOW (ref 13.0–17.0)
MCH: 27.3 pg (ref 26.0–34.0)
MCHC: 32.4 g/dL (ref 30.0–36.0)
MCV: 84.3 fL (ref 80.0–100.0)
Platelets: 282 10*3/uL (ref 150–400)
RBC: 4.07 MIL/uL — ABNORMAL LOW (ref 4.22–5.81)
RDW: 15.5 % (ref 11.5–15.5)
WBC: 11.3 10*3/uL — ABNORMAL HIGH (ref 4.0–10.5)
nRBC: 0 % (ref 0.0–0.2)

## 2021-08-28 LAB — BLOOD CULTURE ID PANEL (REFLEXED) - BCID2

## 2021-08-28 LAB — PROTIME-INR
INR: 2.6 — ABNORMAL HIGH (ref 0.8–1.2)
INR: 2.6 — ABNORMAL HIGH (ref 0.8–1.2)
INR: 2.3 — ABNORMAL HIGH (ref 0.8–1.2)
Prothrombin Time: 27.7 seconds — ABNORMAL HIGH (ref 11.4–15.2)
Prothrombin Time: 27.5 seconds — ABNORMAL HIGH (ref 11.4–15.2)
Prothrombin Time: 24.9 seconds — ABNORMAL HIGH (ref 11.4–15.2)

## 2021-08-28 LAB — SURGICAL PCR SCREEN
MRSA, PCR: NEGATIVE
Staphylococcus aureus: POSITIVE — AB

## 2021-08-28 LAB — C-REACTIVE PROTEIN: CRP: 18.5 mg/dL — ABNORMAL HIGH (ref <1.0)

## 2021-08-28 LAB — GLUCOSE, CAPILLARY
Glucose-Capillary: 156 mg/dL — ABNORMAL HIGH (ref 70–99)
Glucose-Capillary: 131 mg/dL — ABNORMAL HIGH (ref 70–99)
Glucose-Capillary: 152 mg/dL — ABNORMAL HIGH (ref 70–99)
Glucose-Capillary: 100 mg/dL — ABNORMAL HIGH (ref 70–99)
Glucose-Capillary: 182 mg/dL — ABNORMAL HIGH (ref 70–99)
Glucose-Capillary: 155 mg/dL — ABNORMAL HIGH (ref 70–99)

## 2021-08-28 LAB — BASIC METABOLIC PANEL
Anion gap: 9 (ref 5–15)
BUN: 9 mg/dL (ref 6–20)
CO2: 24 mmol/L (ref 22–32)
Calcium: 8.7 mg/dL — ABNORMAL LOW (ref 8.9–10.3)
Chloride: 101 mmol/L (ref 98–111)
Creatinine, Ser: 0.67 mg/dL (ref 0.61–1.24)
GFR, Estimated: >60 mL/min (ref >60)
Glucose, Bld: 157 mg/dL — ABNORMAL HIGH (ref 70–99)
Potassium: 4.3 mmol/L (ref 3.5–5.1)
Sodium: 134 mmol/L — ABNORMAL LOW (ref 135–145)

## 2021-08-28 LAB — HEMOGLOBIN A1C
Hgb A1c MFr Bld: 7.8 % — ABNORMAL HIGH (ref 4.8–5.6)
Mean Plasma Glucose: 177.16 mg/dL

## 2021-08-28 LAB — SEDIMENTATION RATE: Sed Rate: 92 mm/hr — ABNORMAL HIGH (ref 0–16)

## 2021-08-28 SURGERY — AMPUTATION BELOW KNEE
Anesthesia: General | Site: Knee | Laterality: Left

## 2021-08-28 MED ORDER — CHLORHEXIDINE GLUCONATE 4 % EX LIQD
60.0000 mL | Freq: Once | CUTANEOUS | Status: DC
  Administered 2021-08-28: 4 via TOPICAL

## 2021-08-28 MED ORDER — INSULIN ASPART 100 UNIT/ML IJ SOLN: 0.0000 [IU] | Freq: Every day | INTRAMUSCULAR | Status: DC

## 2021-08-28 MED ORDER — ONDANSETRON HCL 4 MG PO TABS: 4.0000 mg | ORAL_TABLET | Freq: Four times a day (QID) | ORAL | Status: DC | PRN

## 2021-08-28 MED ORDER — METOPROLOL TARTRATE 5 MG/5ML IV SOLN: 2.0000 mg | INTRAVENOUS | Status: DC | PRN

## 2021-08-28 MED ORDER — FENTANYL CITRATE (PF) 100 MCG/2ML IJ SOLN: 50.0000 ug | Freq: Once | INTRAMUSCULAR | Status: AC

## 2021-08-28 MED ORDER — MIDAZOLAM HCL 2 MG/2ML IJ SOLN: 2.0000 mg | Freq: Once | INTRAMUSCULAR | Status: AC

## 2021-08-28 MED ORDER — GUAIFENESIN-DM 100-10 MG/5ML PO SYRP: 15.0000 mL | ORAL_SOLUTION | ORAL | Status: DC | PRN

## 2021-08-28 MED ORDER — FENTANYL CITRATE (PF) 250 MCG/5ML IJ SOLN
INTRAMUSCULAR | Status: DC | PRN
  Administered 2021-08-28 (×3): 50 ug via INTRAVENOUS

## 2021-08-28 MED ORDER — PROPOFOL 10 MG/ML IV BOLUS
INTRAVENOUS | Status: DC | PRN
  Administered 2021-08-28: 30 mg via INTRAVENOUS
  Administered 2021-08-28: 200 mg via INTRAVENOUS

## 2021-08-28 MED ORDER — 0.9 % SODIUM CHLORIDE (POUR BTL) OPTIME
TOPICAL | Status: DC | PRN
  Administered 2021-08-28: 1000 mL

## 2021-08-28 MED ORDER — DOCUSATE SODIUM 100 MG PO CAPS
100.0000 mg | ORAL_CAPSULE | Freq: Every day | ORAL | Status: DC
  Administered 2021-08-29 – 2021-09-02 (×5): 100 mg via ORAL
  Filled 2021-08-28 (×5): qty 1

## 2021-08-28 MED ORDER — ACETAMINOPHEN 500 MG PO TABS
1000.0000 mg | ORAL_TABLET | Freq: Once | ORAL | Status: AC
  Administered 2021-08-28: 1000 mg via ORAL
  Filled 2021-08-28: qty 2

## 2021-08-28 MED ORDER — ACETAMINOPHEN 325 MG PO TABS: 325.0000 mg | ORAL_TABLET | Freq: Four times a day (QID) | ORAL | Status: DC | PRN

## 2021-08-28 MED ORDER — OXYCODONE HCL 5 MG PO TABS
5.0000 mg | ORAL_TABLET | ORAL | Status: DC | PRN
  Administered 2021-08-29: 10 mg via ORAL
  Filled 2021-08-28: qty 2

## 2021-08-28 MED ORDER — LACTATED RINGERS IV SOLN: INTRAVENOUS | Status: DC

## 2021-08-28 MED ORDER — FENTANYL CITRATE (PF) 250 MCG/5ML IJ SOLN
INTRAMUSCULAR | Status: AC
  Filled 2021-08-28: qty 5

## 2021-08-28 MED ORDER — ACETAMINOPHEN 325 MG PO TABS
650.0000 mg | ORAL_TABLET | Freq: Four times a day (QID) | ORAL | Status: DC | PRN
  Filled 2021-08-28: qty 2

## 2021-08-28 MED ORDER — MAGNESIUM CITRATE PO SOLN: 1.0000 | Freq: Once | ORAL | Status: DC | PRN

## 2021-08-28 MED ORDER — LABETALOL HCL 5 MG/ML IV SOLN: 10.0000 mg | INTRAVENOUS | Status: DC | PRN

## 2021-08-28 MED ORDER — MUPIROCIN 2 % EX OINT
1.0000 "application " | TOPICAL_OINTMENT | Freq: Two times a day (BID) | CUTANEOUS | Status: AC
  Administered 2021-08-28 – 2021-08-31 (×8): 1 via NASAL
  Filled 2021-08-28 (×2): qty 22

## 2021-08-28 MED ORDER — PHENOL 1.4 % MT LIQD: 1.0000 | OROMUCOSAL | Status: DC | PRN

## 2021-08-28 MED ORDER — CEFAZOLIN SODIUM-DEXTROSE 2-4 GM/100ML-% IV SOLN
2.0000 g | Freq: Three times a day (TID) | INTRAVENOUS | Status: DC
  Administered 2021-08-28 – 2021-09-02 (×15): 2 g via INTRAVENOUS
  Filled 2021-08-28 (×16): qty 100

## 2021-08-28 MED ORDER — POTASSIUM CHLORIDE CRYS ER 20 MEQ PO TBCR: 20.0000 meq | EXTENDED_RELEASE_TABLET | Freq: Every day | ORAL | Status: DC | PRN

## 2021-08-28 MED ORDER — CEFAZOLIN SODIUM-DEXTROSE 2-4 GM/100ML-% IV SOLN: 2.0000 g | Freq: Three times a day (TID) | INTRAVENOUS | Status: DC

## 2021-08-28 MED ORDER — PROMETHAZINE HCL 25 MG/ML IJ SOLN: 6.2500 mg | INTRAMUSCULAR | Status: DC | PRN

## 2021-08-28 MED ORDER — MIDAZOLAM HCL 2 MG/2ML IJ SOLN
INTRAMUSCULAR | Status: AC
  Administered 2021-08-28: 2 mg via INTRAVENOUS
  Filled 2021-08-28: qty 2

## 2021-08-28 MED ORDER — ASCORBIC ACID 500 MG PO TABS
1000.0000 mg | ORAL_TABLET | Freq: Every day | ORAL | Status: DC
  Administered 2021-08-28 – 2021-09-02 (×6): 1000 mg via ORAL
  Filled 2021-08-28 (×6): qty 2

## 2021-08-28 MED ORDER — CHLORHEXIDINE GLUCONATE 0.12 % MT SOLN: 15.0000 mL | Freq: Once | OROMUCOSAL | Status: AC

## 2021-08-28 MED ORDER — INSULIN ASPART 100 UNIT/ML IJ SOLN
0.0000 [IU] | Freq: Three times a day (TID) | INTRAMUSCULAR | Status: DC
  Administered 2021-08-28: 3 [IU] via SUBCUTANEOUS
  Administered 2021-08-29: 2 [IU] via SUBCUTANEOUS
  Administered 2021-08-29 – 2021-08-30 (×5): 3 [IU] via SUBCUTANEOUS
  Administered 2021-08-31: 2 [IU] via SUBCUTANEOUS
  Administered 2021-08-31: 3 [IU] via SUBCUTANEOUS
  Administered 2021-08-31 – 2021-09-02 (×4): 2 [IU] via SUBCUTANEOUS

## 2021-08-28 MED ORDER — TRANEXAMIC ACID-NACL 1000-0.7 MG/100ML-% IV SOLN: 1000.0000 mg | INTRAVENOUS | Status: DC

## 2021-08-28 MED ORDER — POLYETHYLENE GLYCOL 3350 17 G PO PACK: 17.0000 g | PACK | Freq: Every day | ORAL | Status: DC | PRN

## 2021-08-28 MED ORDER — CEFAZOLIN IN SODIUM CHLORIDE 3-0.9 GM/100ML-% IV SOLN
3.0000 g | INTRAVENOUS | Status: AC
  Administered 2021-08-28: 3 g via INTRAVENOUS
  Filled 2021-08-28: qty 100

## 2021-08-28 MED ORDER — MIDAZOLAM HCL 2 MG/2ML IJ SOLN
INTRAMUSCULAR | Status: AC
  Filled 2021-08-28: qty 2

## 2021-08-28 MED ORDER — BUPIVACAINE-EPINEPHRINE (PF) 0.5% -1:200000 IJ SOLN
INTRAMUSCULAR | Status: DC | PRN
  Administered 2021-08-28: 30 mL
  Administered 2021-08-28: 20 mL

## 2021-08-28 MED ORDER — OXYCODONE HCL 5 MG PO TABS
10.0000 mg | ORAL_TABLET | ORAL | Status: DC | PRN
  Administered 2021-08-29 – 2021-09-02 (×14): 15 mg via ORAL
  Filled 2021-08-28 (×16): qty 3

## 2021-08-28 MED ORDER — LIDOCAINE 2% (20 MG/ML) 5 ML SYRINGE
INTRAMUSCULAR | Status: DC | PRN
  Administered 2021-08-28: 40 mg via INTRAVENOUS

## 2021-08-28 MED ORDER — HYDRALAZINE HCL 20 MG/ML IJ SOLN: 5.0000 mg | INTRAMUSCULAR | Status: DC | PRN

## 2021-08-28 MED ORDER — MAGNESIUM SULFATE 2 GM/50ML IV SOLN
2.0000 g | Freq: Every day | INTRAVENOUS | Status: DC | PRN
  Filled 2021-08-28: qty 50

## 2021-08-28 MED ORDER — FENTANYL CITRATE (PF) 100 MCG/2ML IJ SOLN: 25.0000 ug | INTRAMUSCULAR | Status: DC | PRN

## 2021-08-28 MED ORDER — SODIUM CHLORIDE 0.9 % IV SOLN: INTRAVENOUS | Status: DC

## 2021-08-28 MED ORDER — JUVEN PO PACK
1.0000 | PACK | Freq: Two times a day (BID) | ORAL | Status: DC
  Administered 2021-08-28 – 2021-09-02 (×10): 1 via ORAL
  Filled 2021-08-28 (×10): qty 1

## 2021-08-28 MED ORDER — ONDANSETRON HCL 4 MG/2ML IJ SOLN: 4.0000 mg | Freq: Four times a day (QID) | INTRAMUSCULAR | Status: DC | PRN

## 2021-08-28 MED ORDER — HYDROMORPHONE HCL 1 MG/ML IJ SOLN: 0.5000 mg | INTRAMUSCULAR | Status: DC | PRN

## 2021-08-28 MED ORDER — OXYCODONE HCL 5 MG PO TABS: 5.0000 mg | ORAL_TABLET | Freq: Once | ORAL | Status: DC | PRN

## 2021-08-28 MED ORDER — ZINC SULFATE 220 (50 ZN) MG PO CAPS
220.0000 mg | ORAL_CAPSULE | Freq: Every day | ORAL | Status: DC
  Administered 2021-08-28 – 2021-09-02 (×6): 220 mg via ORAL
  Filled 2021-08-28 (×6): qty 1

## 2021-08-28 MED ORDER — OXYCODONE HCL 5 MG/5ML PO SOLN: 5.0000 mg | Freq: Once | ORAL | Status: DC | PRN

## 2021-08-28 MED ORDER — POVIDONE-IODINE 10 % EX SWAB: 2.0000 "application " | Freq: Once | CUTANEOUS | Status: DC

## 2021-08-28 MED ORDER — ALUM & MAG HYDROXIDE-SIMETH 200-200-20 MG/5ML PO SUSP: 15.0000 mL | ORAL | Status: DC | PRN

## 2021-08-28 MED ORDER — ACETAMINOPHEN 650 MG RE SUPP: 650.0000 mg | Freq: Four times a day (QID) | RECTAL | Status: DC | PRN

## 2021-08-28 MED ORDER — SUCCINYLCHOLINE CHLORIDE 200 MG/10ML IV SOSY
PREFILLED_SYRINGE | INTRAVENOUS | Status: DC | PRN
  Administered 2021-08-28: 140 mg via INTRAVENOUS

## 2021-08-28 MED ORDER — PANTOPRAZOLE SODIUM 40 MG PO TBEC
40.0000 mg | DELAYED_RELEASE_TABLET | Freq: Every day | ORAL | Status: DC
  Administered 2021-08-28 – 2021-09-02 (×6): 40 mg via ORAL
  Filled 2021-08-28 (×6): qty 1

## 2021-08-28 MED ORDER — ORAL CARE MOUTH RINSE: 15.0000 mL | Freq: Once | OROMUCOSAL | Status: AC

## 2021-08-28 MED ORDER — INSULIN GLARGINE-YFGN 100 UNIT/ML ~~LOC~~ SOLN
15.0000 [IU] | Freq: Every day | SUBCUTANEOUS | Status: DC
Start: 2021-08-28 — End: 2021-09-02
  Administered 2021-08-29 – 2021-09-02 (×5): 15 [IU] via SUBCUTANEOUS
  Filled 2021-08-28 (×6): qty 0.15

## 2021-08-28 MED ORDER — SERTRALINE HCL 50 MG PO TABS
50.0000 mg | ORAL_TABLET | Freq: Every day | ORAL | Status: DC
  Administered 2021-08-28 – 2021-09-02 (×6): 50 mg via ORAL
  Filled 2021-08-28 (×6): qty 1

## 2021-08-28 MED ORDER — ATORVASTATIN CALCIUM 80 MG PO TABS
80.0000 mg | ORAL_TABLET | Freq: Every day | ORAL | Status: DC
  Administered 2021-08-28 – 2021-09-02 (×6): 80 mg via ORAL
  Filled 2021-08-28 (×6): qty 1

## 2021-08-28 MED ORDER — FENTANYL CITRATE (PF) 100 MCG/2ML IJ SOLN
INTRAMUSCULAR | Status: AC
  Administered 2021-08-28: 50 ug via INTRAVENOUS
  Filled 2021-08-28: qty 2

## 2021-08-28 MED ORDER — PROPOFOL 10 MG/ML IV BOLUS
INTRAVENOUS | Status: AC
  Filled 2021-08-28: qty 40

## 2021-08-28 MED ORDER — ONDANSETRON HCL 4 MG/2ML IJ SOLN
INTRAMUSCULAR | Status: DC | PRN
  Administered 2021-08-28: 4 mg via INTRAVENOUS

## 2021-08-28 MED ORDER — DEXAMETHASONE SODIUM PHOSPHATE 10 MG/ML IJ SOLN
INTRAMUSCULAR | Status: DC | PRN
  Administered 2021-08-28: 4 mg via INTRAVENOUS

## 2021-08-28 MED ORDER — CHLORHEXIDINE GLUCONATE 0.12 % MT SOLN
OROMUCOSAL | Status: AC
  Administered 2021-08-28: 15 mL via OROMUCOSAL
  Filled 2021-08-28: qty 15

## 2021-08-28 MED ORDER — BISACODYL 5 MG PO TBEC: 5.0000 mg | DELAYED_RELEASE_TABLET | Freq: Every day | ORAL | Status: DC | PRN

## 2021-08-28 SURGICAL SUPPLY — 38 items
BAG COUNTER SPONGE SURGICOUNT (BAG) IMPLANT
BAG SPNG CNTER NS LX DISP (BAG)
BLADE SAW RECIP 87.9 MT (BLADE) ×2 IMPLANT
BLADE SURG 21 STRL SS (BLADE) ×2 IMPLANT
BNDG COHESIVE 6X5 TAN NS LF (GAUZE/BANDAGES/DRESSINGS) ×2 IMPLANT
BNDG COHESIVE 6X5 TAN STRL LF (GAUZE/BANDAGES/DRESSINGS) IMPLANT
CANISTER WOUND CARE 500ML ATS (WOUND CARE) ×2 IMPLANT
COVER SURGICAL LIGHT HANDLE (MISCELLANEOUS) ×2 IMPLANT
CUFF TOURN SGL QUICK 34 (TOURNIQUET CUFF) ×2
CUFF TRNQT CYL 34X4.125X (TOURNIQUET CUFF) ×1 IMPLANT
DRAPE INCISE IOBAN 66X45 STRL (DRAPES) ×2 IMPLANT
DRAPE U-SHAPE 47X51 STRL (DRAPES) ×2 IMPLANT
DRESSING PREVENA PLUS CUSTOM (GAUZE/BANDAGES/DRESSINGS) ×1 IMPLANT
DRSG PREVENA PLUS CUSTOM (GAUZE/BANDAGES/DRESSINGS) ×2
DURAPREP 26ML APPLICATOR (WOUND CARE) ×2 IMPLANT
ELECT REM PT RETURN 9FT ADLT (ELECTROSURGICAL) ×2
ELECTRODE REM PT RTRN 9FT ADLT (ELECTROSURGICAL) ×1 IMPLANT
GLOVE SURG ORTHO LTX SZ9 (GLOVE) ×2 IMPLANT
GLOVE SURG UNDER POLY LF SZ9 (GLOVE) ×2 IMPLANT
GOWN STRL REUS W/ TWL XL LVL3 (GOWN DISPOSABLE) ×2 IMPLANT
GOWN STRL REUS W/TWL XL LVL3 (GOWN DISPOSABLE) ×4
KIT BASIN OR (CUSTOM PROCEDURE TRAY) ×2 IMPLANT
KIT TURNOVER KIT B (KITS) ×2 IMPLANT
MANIFOLD NEPTUNE II (INSTRUMENTS) ×2 IMPLANT
NS IRRIG 1000ML POUR BTL (IV SOLUTION) ×2 IMPLANT
PACK ORTHO EXTREMITY (CUSTOM PROCEDURE TRAY) ×2 IMPLANT
PAD ARMBOARD 7.5X6 YLW CONV (MISCELLANEOUS) ×2 IMPLANT
PREVENA RESTOR ARTHOFORM 46X30 (CANNISTER) ×2 IMPLANT
SPONGE T-LAP 18X18 ~~LOC~~+RFID (SPONGE) IMPLANT
STAPLER VISISTAT 35W (STAPLE) IMPLANT
STOCKINETTE IMPERVIOUS LG (DRAPES) ×2 IMPLANT
SUT ETHILON 2 0 PSLX (SUTURE) ×8 IMPLANT
SUT SILK 2 0 (SUTURE) ×2
SUT SILK 2-0 18XBRD TIE 12 (SUTURE) ×1 IMPLANT
SUT VIC AB 1 CTX 27 (SUTURE) ×6 IMPLANT
TOWEL GREEN STERILE (TOWEL DISPOSABLE) ×2 IMPLANT
TUBE CONNECTING 12X1/4 (SUCTIONS) ×2 IMPLANT
YANKAUER SUCT BULB TIP NO VENT (SUCTIONS) ×2 IMPLANT

## 2021-08-28 NOTE — Consult Note (Signed)
Date of Admission:  08/27/2021          Reason for Consult:  MSSA bacteremia due to diabetic foot ulcer in patient with prosthetic aortic valve   Referring Provider: [facility] auto-consult and Charlynne Cousins   Assessment:  MSSA bacteremia due to Diabetic foot ulcer with abscess status post below the knee amputation Mechanical aortic valve replacement on chronic warfarin Hypertension Morbid obesity  Plan:  Narrow to cefazolin Repeat blood culture Transthoracic echocardiogram and he will need a transesophageal echocardiogram Monitor for signs and symptoms of metastatic spread of infection Do not place central line until we have made sure his bacteremia is clearing. Given he has a mechanical valve I would likely give him at least a month of parenteral therapy even if he does not have endocarditis Check ESR, CRP   Dr Linus Salmons will check on the patient this weekend.  Principal Problem:   Diabetic foot infection ([place]) Active Problems:   HTN (hypertension)   Morbid obesity ([place])   S/P AVR (aortic valve replacement)   Severe protein-calorie malnutrition (HCC)   Scheduled Meds:  vitamin C  1,000 mg Oral Daily   atorvastatin  80 mg Oral Daily   [START ON 08/29/2021] docusate sodium  100 mg Oral Daily   influenza vac split quadrivalent PF  0.5 mL Intramuscular Tomorrow-1000   insulin aspart  0-15 Units Subcutaneous TID WC   insulin aspart  0-5 Units Subcutaneous QHS   insulin glargine-yfgn  15 Units Subcutaneous Daily   mupirocin ointment  1 application Nasal BID   nutrition supplement (JUVEN)  1 packet Oral BID BM   pantoprazole  40 mg Oral Daily   sertraline  50 mg Oral Daily   zinc sulfate  220 mg Oral Daily   Continuous Infusions:  sodium chloride      ceFAZolin (ANCEF) IV     ceFEPime (MAXIPIME) IV Stopped (08/28/21 0349)   magnesium sulfate bolus IVPB     vancomycin 1,500 mg (08/28/21 1041)   PRN Meds:.acetaminophen **OR** acetaminophen, [START ON 08/29/2021]  acetaminophen, alum & mag hydroxide-simeth, bisacodyl, guaiFENesin-dextromethorphan, hydrALAZINE, HYDROmorphone (DILAUDID) injection, labetalol, magnesium citrate, magnesium sulfate bolus IVPB, metoprolol tartrate, ondansetron **OR** ondansetron (ZOFRAN) IV, ondansetron, oxyCODONE, oxyCODONE, phenol, polyethylene glycol, potassium chloride  HPI: Adam Long is a 45 y.o. male history of morbid obesity hypertension diabetes mellitus on insulin, nonobstructive coronary artery disease, who underwent aortic valve replacement in 2021 and has done relatively well but had problems with a diabetic foot ulcer for several years it seems.  He has undergone left fourth and fifth ray amputation in June 2022.  He has been followed closely by Dr. Sharol Given and by vascular surgery.  He had abrupt worsening of his foot pain with drainage edema and what he described as dead skin that emerged.  He also began to have fevers chills and systemic symptoms of nausea.  The emergency department blood cultures were taken and the patient was started on vancomycin and cefepime.  Plain film showed gas present in the left foot.  He was taken to the operating room underwent transtibial amputation today.  Blood cultures have come back positive for methicillin sensitive Staph aureus.  Will narrow him to cefazolin and I will repeat his blood cultures.  Clearly he needs a 2D echocardiogram but also a transesophageal echocardiogram.  I have told him he is to notify us of any symptoms and signs that are concerning for spreading infection particular in pain and new places in his  body.  I spent 84 minutes with the patient including than 50% of the time in face to face counseling of the patient the nature of MSSA bacteremia aortic valve infections diabetic foot infections, reviewing his blood culture data his CBC BMP plain films of the foot MRI of the foot, along with with review of medical records in preparation for the visit and  during the visit and in coordination of his care.       Review of Systems: Review of Systems  Constitutional:  Positive for fever. Negative for chills, malaise/fatigue and weight loss.  HENT:  Negative for congestion and sore throat.   Eyes:  Negative for blurred vision and photophobia.  Respiratory:  Positive for cough. Negative for shortness of breath and wheezing.   Cardiovascular:  Positive for palpitations. Negative for chest pain and leg swelling.  Gastrointestinal:  Positive for nausea. Negative for abdominal pain, blood in stool, constipation, diarrhea, heartburn, melena and vomiting.  Genitourinary:  Negative for dysuria, flank pain and hematuria.  Musculoskeletal:  Positive for myalgias. Negative for back pain, falls and joint pain.  Skin:  Negative for itching and rash.  Neurological:  Positive for weakness. Negative for dizziness, focal weakness, loss of consciousness and headaches.  Endo/Heme/Allergies:  Bruises/bleeds easily.  Psychiatric/Behavioral:  Negative for depression and suicidal ideas. The patient does not have insomnia.    Past Medical History:  Diagnosis Date   Anxiety    Aortic stenosis    Cellulitis and abscess of lower extremity 06/11/2019   Cellulitis of fourth toe of left foot    Cholelithiasis    Coronary artery disease    Nonobstructive CAD (40-50% LAD) 08/2019   Depression    Depression    Phreesia 09/27/2020   Depression    Phreesia 11/01/2020   Diabetes mellitus without complication ([place])    Phreesia 09/27/2020   Elevated troponin level not due myocardial infarction 11/11/2019   Essential hypertension    Gangrene of toe of left foot ([place]) 07/06/2019   Heart murmur    Phreesia 09/27/2020   Hyperlipidemia    Phreesia 09/27/2020   Hypertension    Phreesia 09/27/2020   Mixed hyperlipidemia    Morbid obesity (HCC)    S/P aortic valve replacement with mechanical valve 12/05/2019   25 mm Carbomedics top hat bileaflet mechanical valve via  partial upper hemi-sternotomy   Severe aortic stenosis 09/24/2019   Type 2 diabetes mellitus (HCC)     Social History   Tobacco Use   Smoking status: Never   Smokeless tobacco: Former    Types: Chew    Quit date: 2021  Vaping Use   Vaping Use: Never used  Substance Use Topics   Alcohol use: Not Currently    Comment: occasionally   Drug use: No    Family History  Problem Relation Age of Onset   Cancer Mother        Brain   Heart disease Father    Hyperlipidemia Father    Hypertension Father    Stroke Father    Heart murmur Sister    Allergies  Allergen Reactions   Pollen Extract Other (See Comments)    Runny nose, watery eyes, sneezing    OBJECTIVE: Blood pressure (!) 109/57, pulse 72, temperature 97.6 F (36.4 C), temperature source Oral, resp. rate 16, height '5\' 10"'  (1.778 m), weight (!) 172.4 kg, SpO2 100 %.  Physical Exam Constitutional:      Appearance: He is well-developed.  HENT:  Head: Normocephalic and atraumatic.  Eyes:     Conjunctiva/sclera: Conjunctivae normal.  Cardiovascular:     Rate and Rhythm: Normal rate and regular rhythm.     Heart sounds: No murmur heard.   No friction rub. No gallop.  Pulmonary:     Effort: Pulmonary effort is normal. No respiratory distress.     Breath sounds: No stridor. No wheezing or rhonchi.  Abdominal:     General: Bowel sounds are normal. There is no distension.     Palpations: Abdomen is soft. There is no mass.  Musculoskeletal:        General: No tenderness. Normal range of motion.     Cervical back: Normal range of motion and neck supple.  Skin:    General: Skin is warm and dry.  Neurological:     General: No focal deficit present.     Mental Status: He is alert and oriented to person, place, and time.  Psychiatric:        Mood and Affect: Mood normal.        Behavior: Behavior normal.        Thought Content: Thought content normal.        Judgment: Judgment normal.   Right foot without any  ulcerations  Left below the knee amputation site is bandaged  Lab Results Lab Results  Component Value Date   WBC 11.3 (H) 08/28/2021   HGB 11.1 (L) 08/28/2021   HCT 34.3 (L) 08/28/2021   MCV 84.3 08/28/2021   PLT 282 08/28/2021    Lab Results  Component Value Date   CREATININE 0.67 08/28/2021   BUN 9 08/28/2021   NA 134 (L) 08/28/2021   K 4.3 08/28/2021   CL 101 08/28/2021   CO2 24 08/28/2021    Lab Results  Component Value Date   ALT 26 08/27/2021   AST 20 08/27/2021   ALKPHOS 76 08/27/2021   BILITOT 0.7 08/27/2021     Microbiology: Recent Results (from the past 240 hour(s))  Resp Panel by RT-PCR (Flu A&B, Covid) Nasopharyngeal Swab     Status: None   Collection Time: 08/27/21 10:03 AM   Specimen: Nasopharyngeal Swab; Nasopharyngeal(NP) swabs in vial transport medium  Result Value Ref Range Status   SARS Coronavirus 2 by RT PCR NEGATIVE NEGATIVE Final    Comment: (NOTE) SARS-CoV-2 target nucleic acids are NOT DETECTED.  The SARS-CoV-2 RNA is generally detectable in upper respiratory specimens during the acute phase of infection. The lowest concentration of SARS-CoV-2 viral copies this assay can detect is 138 copies/mL. A negative result does not preclude SARS-Cov-2 infection and should not be used as the sole basis for treatment or other patient management decisions. A negative result may occur with  improper specimen collection/handling, submission of specimen other than nasopharyngeal swab, presence of viral mutation(s) within the areas targeted by this assay, and inadequate number of viral copies(<138 copies/mL). A negative result must be combined with clinical observations, patient history, and epidemiological information. The expected result is Negative.  Fact Sheet for Patients:  EntrepreneurPulse.com.au  Fact Sheet for Healthcare Providers:  IncredibleEmployment.be  This test is no t yet approved or cleared by the  [place] FDA and  has been authorized for detection and/or diagnosis of SARS-CoV-2 by FDA under an Emergency Use Authorization (EUA). This EUA will remain  in effect (meaning this test can be used) for the duration of the COVID-19 declaration under Section 564(b)(1) of the Act, 21 U.S.C.section 360bbb-3(b)(1), unless the authorization is terminated  or revoked sooner.       Influenza A by PCR NEGATIVE NEGATIVE Final   Influenza B by PCR NEGATIVE NEGATIVE Final    Comment: (NOTE) The Xpert Xpress SARS-CoV-2/FLU/RSV plus assay is intended as an aid in the diagnosis of influenza from Nasopharyngeal swab specimens and should not be used as a sole basis for treatment. Nasal washings and aspirates are unacceptable for Xpert Xpress SARS-CoV-2/FLU/RSV testing.  Fact Sheet for Patients: EntrepreneurPulse.com.au  Fact Sheet for Healthcare Providers: IncredibleEmployment.be  This test is not yet approved or cleared by the [place] FDA and has been authorized for detection and/or diagnosis of SARS-CoV-2 by FDA under an Emergency Use Authorization (EUA). This EUA will remain in effect (meaning this test can be used) for the duration of the COVID-19 declaration under Section 564(b)(1) of the Act, 21 U.S.C. section 360bbb-3(b)(1), unless the authorization is terminated or revoked.  Performed at [facility], [address], [place], [place] [address]   Culture, blood (routine x 2)     Status: None (Preliminary result)   Collection Time: 08/27/21 10:28 AM   Specimen: Left Antecubital; Blood  Result Value Ref Range Status   Specimen Description LEFT ANTECUBITAL  Final   Special Requests   Final    BOTTLES DRAWN AEROBIC AND ANAEROBIC Blood Culture adequate volume   Culture   Final    NO GROWTH < 24 HOURS Performed at [facility], [address], [place], [place] [address]    Report Status PENDING  Incomplete  Culture, blood (routine x 2)      Status: None (Preliminary result)   Collection Time: 08/27/21 10:40 AM   Specimen: Right Antecubital; Blood  Result Value Ref Range Status   Specimen Description   Final    RIGHT ANTECUBITAL Performed at [facility], [address] [place], [place] [address]    Special Requests   Final    BOTTLES DRAWN AEROBIC AND ANAEROBIC Blood Culture results may not be optimal due to an excessive volume of blood received in culture bottles Performed at [facility], [address] [place], [place] [address]    Culture  Setup Time   Final    GRAM POSITIVE COCCI aerobic bottle Gram Stain Report Called to,Read Back By and Verified With: Coleman,V@ [place] 5N'@[phone]'  by Zigmund Daniel, b 12.2.22 CRITICAL RESULT CALLED TO, READ BACK BY AND VERIFIED WITH: PHARMD E MARTIN [id number] AT 1306 BY CM Performed at [facility], [place] [address], [place], [place] [address]    Culture GRAM POSITIVE COCCI  Final   Report Status PENDING  Incomplete  Blood Culture ID Panel (Reflexed)     Status: Abnormal   Collection Time: 08/27/21 10:40 AM  Result Value Ref Range Status   Enterococcus faecalis NOT DETECTED NOT DETECTED Final   Enterococcus Faecium NOT DETECTED NOT DETECTED Final   Listeria monocytogenes NOT DETECTED NOT DETECTED Final   Staphylococcus species DETECTED (A) NOT DETECTED Final    Comment: CRITICAL RESULT CALLED TO, READ BACK BY AND VERIFIED WITH: PHARMD E MARTIN [id number] AT 1306 BY CM    Staphylococcus aureus (BCID) DETECTED (A) NOT DETECTED Final    Comment: CRITICAL RESULT CALLED TO, READ BACK BY AND VERIFIED WITH: PHARMD E MARTIN [id number] AT 1306 BY CM    Staphylococcus epidermidis NOT DETECTED NOT DETECTED Final   Staphylococcus lugdunensis NOT DETECTED NOT DETECTED Final   Streptococcus species NOT DETECTED NOT DETECTED Final   Streptococcus agalactiae NOT DETECTED NOT DETECTED Final   Streptococcus  pneumoniae NOT DETECTED NOT DETECTED Final   Streptococcus pyogenes NOT DETECTED NOT DETECTED Final    A.calcoaceticus-baumannii NOT DETECTED NOT DETECTED Final   Bacteroides fragilis NOT DETECTED NOT DETECTED Final   Enterobacterales NOT DETECTED NOT DETECTED Final   Enterobacter cloacae complex NOT DETECTED NOT DETECTED Final   Escherichia coli NOT DETECTED NOT DETECTED Final   Klebsiella aerogenes NOT DETECTED NOT DETECTED Final   Klebsiella oxytoca NOT DETECTED NOT DETECTED Final   Klebsiella pneumoniae NOT DETECTED NOT DETECTED Final   Proteus species NOT DETECTED NOT DETECTED Final   Salmonella species NOT DETECTED NOT DETECTED Final   Serratia marcescens NOT DETECTED NOT DETECTED Final   Haemophilus influenzae NOT DETECTED NOT DETECTED Final   Neisseria meningitidis NOT DETECTED NOT DETECTED Final   Pseudomonas aeruginosa NOT DETECTED NOT DETECTED Final   Stenotrophomonas maltophilia NOT DETECTED NOT DETECTED Final   Candida albicans NOT DETECTED NOT DETECTED Final   Candida auris NOT DETECTED NOT DETECTED Final   Candida glabrata NOT DETECTED NOT DETECTED Final   Candida krusei NOT DETECTED NOT DETECTED Final   Candida parapsilosis NOT DETECTED NOT DETECTED Final   Candida tropicalis NOT DETECTED NOT DETECTED Final   Cryptococcus neoformans/gattii NOT DETECTED NOT DETECTED Final   Meth resistant mecA/C and MREJ NOT DETECTED NOT DETECTED Final    Comment: Performed at [facility], [address] [place], [place] [address]  Surgical PCR screen     Status: Abnormal   Collection Time: 08/28/21  9:41 AM   Specimen: Nasal Mucosa; Nasal Swab  Result Value Ref Range Status   MRSA, PCR NEGATIVE NEGATIVE Final   Staphylococcus aureus POSITIVE (A) NEGATIVE Final    Comment: (NOTE) The Xpert SA Assay (FDA approved for NASAL specimens in patients 12 years of age and older), is one component of a comprehensive surveillance program. It is not intended to diagnose infection nor to guide or monitor treatment. Performed at [facility], [place] [address] [place],  [place] [address]     Alcide Evener, [facility] [phone] pager  08/28/2021, 1:52 PM

## 2021-08-28 NOTE — Progress Notes (Signed)
ANTICOAGULATION CONSULT NOTE  Pharmacy Consult for heparin Indication:  Mechanical aortic valve  Allergies  Allergen Reactions   Pollen Extract Other (See Comments)    Runny nose, watery eyes, sneezing    Patient Measurements: Height: 5\' 10"  (177.8 cm) Weight: (!) 172.4 kg (380 lb 1.2 oz) IBW/kg (Calculated) : 73 Heparin Dosing Weight: 115 kg  Vital Signs: Temp: 98.4 F (36.9 C) (12/02 1954) Temp Source: Oral (12/02 1533) BP: 137/63 (12/02 1954) Pulse Rate: 70 (12/02 1954)  Labs: Recent Labs    08/27/21 0950 08/27/21 0952 08/28/21 0545 08/28/21 0801 08/28/21 1911  HGB 10.7*  --  11.1*  --   --   HCT 33.1*  --  34.3*  --   --   PLT 266  --  282  --   --   LABPROT  --    < > 27.7* 27.5* 24.9*  INR  --    < > 2.6* 2.6* 2.3*  CREATININE 0.72  --  0.67  --   --    < > = values in this interval not displayed.     Estimated Creatinine Clearance: 186 mL/min (by C-G formula based on SCr of 0.67 mg/dL).   Assessment: Pharmacy consulted to dose heparin in patient with mechanical aortic valve.  Currently holding warfarin due to surgical needs.  INR remains therapeutic.  Goal of Therapy:  Heparin level 0.3-0.7 units/ml Monitor platelets by anticoagulation protocol: Yes   Plan:  Start heparin infusion when INR < 2  Carollee Nussbaumer D. 11-03-1982, PharmD, BCPS, BCCCP 08/28/2021, 8:00 PM

## 2021-08-28 NOTE — Consult Note (Signed)
ORTHOPAEDIC CONSULTATION  REQUESTING PHYSICIAN: Marinda ElkFeliz Ortiz, Abraham, MD  Chief Complaint: Abscess ulceration left foot.  HPI: Adam Long is a 45 y.o. male who presents with abscess ulceration and swelling of the left foot.  Patient states he has been working extra shifts and has been unable to stay off his foot he developed increasing ulceration swelling and cellulitis.  Past Medical History:  Diagnosis Date   Anxiety    Aortic stenosis    Cellulitis and abscess of lower extremity 06/11/2019   Cellulitis of fourth toe of left foot    Cholelithiasis    Coronary artery disease    Nonobstructive CAD (40-50% LAD) 08/2019   Depression    Depression    Phreesia 09/27/2020   Depression    Phreesia 11/01/2020   Diabetes mellitus without complication (HCC)    Phreesia 09/27/2020   Elevated troponin level not due myocardial infarction 11/11/2019   Essential hypertension    Gangrene of toe of left foot (HCC) 07/06/2019   Heart murmur    Phreesia 09/27/2020   Hyperlipidemia    Phreesia 09/27/2020   Hypertension    Phreesia 09/27/2020   Mixed hyperlipidemia    Morbid obesity (HCC)    S/P aortic valve replacement with mechanical valve 12/05/2019   25 mm Carbomedics top hat bileaflet mechanical valve via partial upper hemi-sternotomy   Severe aortic stenosis 09/24/2019   Type 2 diabetes mellitus (HCC)    Past Surgical History:  Procedure Laterality Date   ABDOMINAL AORTOGRAM W/LOWER EXTREMITY N/A 07/06/2019   Procedure: ABDOMINAL AORTOGRAM W/LOWER EXTREMITY;  Surgeon: Sherren KernsFields, Charles E, MD;  [facility]: [facility];  Service: Cardiovascular;  Laterality: N/A;  Bilateral   AMPUTATION Left 07/09/2019   Procedure: LEFT FOURTH and Fifth TOE AMPUTATION.;  Surgeon: Larina EarthlyEarly, Todd F, MD;  [facility];  Service: Vascular;  Laterality: Left;   AMPUTATION Left 03/11/2021   Procedure: LEFT FOOT 5TH  AND 4TH RAY AMPUTATION;  Surgeon: Nadara Mustarduda, Dorice Stiggers V, MD;  Location: [facility] OR;  Service:  Orthopedics;  Laterality: Left;   AORTIC VALVE REPLACEMENT N/A 12/05/2019   Procedure: PARTIAL STERNOTOMY FOR AORTIC VALVE REPLACEMENT (AVR), USING CARBOMEDICS SUPRA-ANNULAR TOP HAT 25MM;  Surgeon: Purcell Nailswen, Clarence H, MD;  [facility];  Service: Open Heart Surgery;  Laterality: N/A;  No neck lines on left   CARDIAC VALVE REPLACEMENT N/A    Phreesia 09/27/2020   IR RADIOLOGY PERIPHERAL GUIDED IV START  10/05/2019   IR US GUIDE VASC ACCESS RIGHT  10/05/2019   MULTIPLE EXTRACTIONS WITH ALVEOLOPLASTY N/A 10/26/2019   Procedure: EXTRACTION OF TOOTH #'S 3, 5-11,19-28,  AND 32 WITH ALVEOLOPLASTY;  Surgeon: Charlynne PanderKulinski, Ronald F, DDS;  Location: [facility] OR;  Service: Oral Surgery;  Laterality: N/A;   RIGHT HEART CATH AND CORONARY ANGIOGRAPHY N/A 09/24/2019   Procedure: RIGHT HEART CATH AND CORONARY ANGIOGRAPHY;  Surgeon: Tonny Bollmanooper, Michael, MD;  [facility];  Service: Cardiovascular;  Laterality: N/A;   TEE WITHOUT CARDIOVERSION N/A 12/05/2019   Procedure: TRANSESOPHAGEAL ECHOCARDIOGRAM (TEE);  Surgeon: Purcell Nailswen, Clarence H, MD;  [facility];  Service: Open Heart Surgery;  Laterality: N/A;   Social History   Socioeconomic History   Marital status: Single    Spouse name: Not on file   Number of children: Not on file   Years of education: Not on file   Highest education level: Not on file  Occupational History    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  Tobacco Use   Smoking  status: Never   Smokeless tobacco: Former    Types: Chew    Quit date: 2021  Vaping Use   Vaping Use: Never used  Substance and Sexual Activity   Alcohol use: Not Currently    Comment: occasionally   Drug use: No   Sexual activity: Yes    Birth control/protection: None  Other Topics Concern   Not on file  Social History Narrative   Not on file   Social Determinants of Health   Financial Resource Strain: Not on file  Food Insecurity: Not on file  Transportation Needs: Not on file  Physical Activity: Not  on file  Stress: Not on file  Social Connections: Not on file   Family History  Problem Relation Age of Onset   Cancer Mother        Brain   Heart disease Father    Hyperlipidemia Father    Hypertension Father    Stroke Father    Heart murmur Sister    - negative except otherwise stated in the family history section Allergies  Allergen Reactions   Pollen Extract Other (See Comments)    Runny nose, watery eyes, sneezing   Prior to Admission medications   Medication Sig Start Date End Date Taking? Authorizing Provider  atorvastatin (LIPITOR) 80 MG tablet Take 1 tablet (80 mg total) by mouth daily. 11/04/20  Yes Heather Roberts, NP  insulin glargine (LANTUS SOLOSTAR) 100 UNIT/ML Solostar Pen Inject 30 Units into the skin at bedtime. 12/17/20  Yes Dani Gobble, NP  lisinopril (ZESTRIL) 20 MG tablet Take 1 tablet by mouth once daily 07/22/21  Yes Kerri Perches, MD  metoprolol tartrate (LOPRESSOR) 25 MG tablet Take 1 tablet by mouth twice daily 07/22/21  Yes Kerri Perches, MD  sertraline (ZOLOFT) 50 MG tablet Take 1 tablet by mouth once daily 03/09/21  Yes Heather Roberts, NP  sitaGLIPtin-metformin (JANUMET) 50-500 MG tablet Take 1 tablet by mouth 2 (two) times daily with a meal. 12/17/20  Yes Dani Gobble, NP  warfarin (COUMADIN) 10 MG tablet Take 1 tablet daily. 07/27/21  Yes Jonelle Sidle, MD   DG Foot Complete Left  Result Date: 08/27/2021 CLINICAL DATA:  Wound EXAM: LEFT FOOT - COMPLETE 3+ VIEW COMPARISON:  Radiograph dated May 13, 2021 FINDINGS: Prior amputations of the fourth and fifth toes with heterotopic ossification. Osseous deformities with heterotopic calcification of the second and third metatarsals due to prior fractures. No definite areas of osseous destruction. Increased soft tissue swelling and new soft tissue gas of the lateral left foot. IMPRESSION: 1. Increased soft tissue swelling and new soft tissue gas of the lateral left foot, findings are  concerning for infection with gas-forming organism. 2. No definite osteomyelitis, although chronic changes somewhat limited evaluation. IF there is clinical concern, consider MRI. Electronically Signed   By: Allegra Lai M.D.   On: 08/27/2021 10:24   - pertinent xrays, CT, MRI studies were reviewed and independently interpreted  Positive ROS: All other systems have been reviewed and were otherwise negative with the exception of those mentioned in the HPI and as above.  Physical Exam: General: Alert, no acute distress Psychiatric: Patient is competent for consent with normal mood and affect Lymphatic: No axillary or cervical lymphadenopathy Cardiovascular: No pedal edema Respiratory: No cyanosis, no use of accessory musculature GI: No organomegaly, abdomen is soft and non-tender    Images:  @ENCIMAGES @  Labs:  Lab Results  Component Value Date   HGBA1C 7.8 (  H) 08/28/2021   HGBA1C 6.8 (H) 06/04/2021   HGBA1C 6.5 (H) 02/02/2021   ESRSEDRATE 62 (H) 02/12/2021   CRP 18.5 (H) 08/28/2021   CRP 5.8 (H) 02/15/2021   REPTSTATUS PENDING 08/27/2021   GRAMSTAIN  12/08/2015    ABUNDANT WBC PRESENT, PREDOMINANTLY PMN NO SQUAMOUS EPITHELIAL CELLS SEEN ABUNDANT GRAM POSITIVE COCCI IN CLUSTERS Performed at [place] [facility] PENDING 08/27/2021    Lab Results  Component Value Date   ALBUMIN 2.7 (L) 08/27/2021   ALBUMIN 3.8 (L) 06/04/2021   ALBUMIN 3.2 (L) 02/15/2021     CBC EXTENDED Latest Ref Rng & Units 08/28/2021 08/27/2021 06/04/2021  WBC 4.0 - 10.5 K/uL 11.3(H) 15.3(H) 6.7  RBC 4.22 - 5.81 MIL/uL 4.07(L) 3.81(L) 4.32  HGB 13.0 - 17.0 g/dL 11.1(L) 10.7(L) 11.8(L)  HCT 39.0 - 52.0 % 34.3(L) 33.1(L) 36.9(L)  PLT 150 - 400 K/uL 282 266 240  NEUTROABS 1.7 - 7.7 K/uL - 12.7(H) 4.3  LYMPHSABS 0.7 - 4.0 K/uL - 1.1 1.5    Neurologic: Patient does not have protective sensation bilateral lower extremities.   MUSCULOSKELETAL:   Skin: Examination patient has a large  necrotic ulcer over the plantar aspect of the left foot.  There is cellulitis and significant swelling of the entire left foot and left lower extremity with cellulitis extending up to the mid calf.  Review the radiographs shows air in the soft tissue.  White cell count has decreased to 11.3 from 15.3 yesterday.  Hemoglobin stable at 11.1.  Albumin 2.7.  Hemoglobin A1c 7.8.  Assessment: Assessment: Abscess ulceration cellulitis left foot.  Plan: Patient does not have any further foot salvage intervention options.  We will plan for a left transtibial amputation.  Risks and benefits were discussed including risk of the wound not healing.  Patient states he understands wished to proceed at this time.  Anticipate discharge to inpatient versus outpatient rehab.  Thank you for the consult and the opportunity to see Adam Long, [place] [phone] 7:29 AM

## 2021-08-28 NOTE — Progress Notes (Signed)
Inpatient Diabetes Program Recommendations  AACE/ADA: New Consensus Statement on Inpatient Glycemic Control (2015)  Target Ranges:  Prepandial:   less than 140 mg/dL      Peak postprandial:   less than 180 mg/dL (1-2 hours)      Critically ill patients:  140 - 180 mg/dL   Lab Results  Component Value Date   GLUCAP 152 (H) 08/28/2021   HGBA1C 7.8 (H) 08/28/2021    Review of Glycemic Control  Latest Reference Range & Units 08/27/21 21:28 08/28/21 07:56 08/28/21 10:06 08/28/21 12:54  Glucose-Capillary 70 - 99 mg/dL 633 (H) 354 (H) 562 (H) 152 (H)   Diabetes history: DM 2 Outpatient Diabetes medications:  Janumet 50-500 mg one tablet bid Lantus 30 units q HS Current orders for Inpatient glycemic control:  Novolog moderate tid with meals and HS  Inpatient Diabetes Program Recommendations:    Blood glucose currently within hospital goals.  If blood sugars increase, may consider adding 1/2 of home basal insulin-Semglee 15 units daily.   Thanks,  Beryl Meager, RN, BC-ADM Inpatient Diabetes Coordinator Pager [phone]  (8a-5p)

## 2021-08-28 NOTE — Plan of Care (Signed)

## 2021-08-28 NOTE — Op Note (Signed)
   Date of Surgery: 08/28/2021  INDICATIONS: Adam Long is a 45 y.o.-year-old male who presents with necrotic ulceration to the plantar aspect of his foot with cellulitis swelling extending up to the mid calf.  Patient has air in the soft tissue throughout the foot.  Patient does not have foot salvage intervention options and presents at this time for transtibial amputation.Marland Kitchen  PREOPERATIVE DIAGNOSIS: Ulceration abscess left foot  POSTOPERATIVE DIAGNOSIS: Same.  PROCEDURE: Transtibial amputation Application of Prevena wound VAC  SURGEON: Lajoyce Corners, M.D.  ANESTHESIA:  general  IV FLUIDS AND URINE: See anesthesia records.  ESTIMATED BLOOD LOSS: See anesthesia records.  COMPLICATIONS: None.  DESCRIPTION OF PROCEDURE: The patient was brought to the operating room after undergoing regional anesthetic. After adequate levels of anesthesia were obtained patient's lower extremity was prepped using DuraPrep draped into a sterile field. A timeout was called. The foot was draped out of the sterile field with impervious stockinette. A transverse incision was made 11 cm distal to the tibial tubercle. This curved proximally and a large posterior flap was created. The tibia was transected 1 cm proximal to the skin incision. The fibula was transected just proximal to the tibial incision. The tibia was beveled anteriorly. A large posterior flap was created. The sciatic nerve was pulled cut and allowed to retract. The vascular bundles were suture ligated with 2-0 silk. The deep and superficial fascial layers were closed using #1 Vicryl. The skin was closed using staples and 2-0 nylon. The wound was covered with a Prevena customizable and arthroform wound VAC.  The dressing was sealed with dermatac there was a good suction fit. A prosthetic shrinker and limb protector were applied. Patient was taken to the PACU in stable condition.   DISCHARGE PLANNING:  Antibiotic duration: 24 hours  Weightbearing:  Nonweightbearing on the operative extremity  Pain medication: Opioid pathway  Dressing care/ Wound VAC: Continue wound VAC for 1 week after discharge  Discharge to: Discharge planning based on therapy's recommendations for possible inpatient rehabilitation, outpatient rehabilitation, or discharge to home with therapy  Follow-up: In the office 1 week post operative.  Adam Baker, MD [facility] 12:51 PM

## 2021-08-28 NOTE — Anesthesia Postprocedure Evaluation (Signed)
Anesthesia Post Note  Patient: Adam Long  Procedure(s) Performed: AMPUTATION BELOW KNEE (Left: Knee)     Patient location during evaluation: PACU Anesthesia Type: General Level of consciousness: awake and alert Pain management: pain level controlled Vital Signs Assessment: post-procedure vital signs reviewed and stable Respiratory status: spontaneous breathing, nonlabored ventilation, respiratory function stable and patient connected to nasal cannula oxygen Cardiovascular status: blood pressure returned to baseline and stable Postop Assessment: no apparent nausea or vomiting Anesthetic complications: no   No notable events documented.  Last Vitals:  Vitals:   08/28/21 1349 08/28/21 1533  BP: (!) 109/57 118/67  Pulse: 72 76  Resp: 16 16  Temp: 36.4 C 36.8 C  SpO2: 100% 97%    Last Pain:  Vitals:   08/28/21 1533  TempSrc: Oral  PainSc:                  Beryle Lathe

## 2021-08-28 NOTE — Progress Notes (Signed)
[facility] PROGRESS NOTE    Progress Note  BEARL TALARICO  YHC:[id number] DOB: [birth year] DOA: 08/27/2021 PCP: Noreene Larsson, NP     Brief Narrative:   Adam Long is an 45 y.o. male past medical history significant for diabetes mellitus type 2, mechanical aortic valve on warfarin, hypertension morbid obesity comes in with an infected diabetic foot.  Orthopedic surgery was consulted    Assessment/Plan:   Left Diabetic foot infection ([place]) Will start empirically on IV vancomycin and cefepime. ESR of 92 Orthopedic surgery was consulted recommended a left transtibial amputation risk and benefit discussed with the patient's.  Mechanical aortic valve: Hold warfarin started IV heparin. INR is drifting down this morning's 2.6.  Uncontrolled diabetes mellitus type 2 with hyperglycemia: Hold oral hypoglycemic agents.  A1c is 7.8, started on sliding scale insulin plus long-acting insulin.  Essential  HTN (hypertension) Continue metoprolol hold lisinopril for surgical intervention.    Hyperlipidemia: Excellent continue statins.  Morbid obesity: With a BMI of 52 counseling.   DVT prophylaxis: Hepairn Family Communication:none Status is: Inpatient  Remains inpatient appropriate because: Diabetic foot infection requiring amputation        Code Status:     Code Status Orders  (From admission, onward)           Start     Ordered   08/28/21 0254  Full code  Continuous        08/28/21 0253           Code Status History     Date Active Date Inactive Code Status Order ID Comments User Context   02/12/2021 2321 02/17/2021 1958 Full Code [id number]  Bernadette Hoit, DO Inpatient   12/05/2019 [place] 12/12/2019 1546 Full Code [id number]  Elgie Collard, PA-C Inpatient   11/11/2019 0042 11/11/2019 1714 Full Code [id number]  Bryna Colander, MD Inpatient   09/24/2019 0821 09/24/2019 1538 Full Code [id number]  Sherren Mocha, MD Inpatient   07/06/2019 1835  07/10/2019 1432 Full Code [id number]  Elam Dutch, MD Inpatient   06/11/2019 1707 06/14/2019 1844 Full Code [id number]  Roxan Hockey, MD Inpatient   07/02/2013 1545 07/03/2013 1535 Full Code [id number]  Kinnie Feil, MD ED         IV Access:   Peripheral IV   Procedures and diagnostic studies:   DG Foot Complete Left  Result Date: 08/27/2021 CLINICAL DATA:  Wound EXAM: LEFT FOOT - COMPLETE 3+ VIEW COMPARISON:  Radiograph dated May 13, 2021 FINDINGS: Prior amputations of the fourth and fifth toes with heterotopic ossification. Osseous deformities with heterotopic calcification of the second and third metatarsals due to prior fractures. No definite areas of osseous destruction. Increased soft tissue swelling and new soft tissue gas of the lateral left foot. IMPRESSION: 1. Increased soft tissue swelling and new soft tissue gas of the lateral left foot, findings are concerning for infection with gas-forming organism. 2. No definite osteomyelitis, although chronic changes somewhat limited evaluation. IF there is clinical concern, consider MRI. Electronically Signed   By: Yetta Glassman M.D.   On: 08/27/2021 10:24     Medical Consultants:   None.   Subjective:    MICHALL NOFFKE no complaints  Objective:    Vitals:   08/27/21 2000 08/27/21 2123 08/28/21 0522 08/28/21 0756  BP: (!) 139/92 (!) 113/57 139/77 (!) 154/82  Pulse: 81 99 79 74  Resp: '18 18 19 16  ' Temp:  98.9 F (37.2 C) 98.4 F (36.9 C) 98.9 F (37.2  C)  TempSrc:  Oral Oral Oral  SpO2: 98% 100% 98% 100%  Weight:      Height:       SpO2: 100 %   Intake/Output Summary (Last 24 hours) at 08/28/2021 0806 Last data filed at 08/28/2021 0645 Gross per 24 hour  Intake 1000 ml  Output 2000 ml  Net -1000 ml   Filed Weights   08/27/21 0853  Weight: (!) 172.4 kg    Exam: General exam: In no acute distress. Respiratory system: Good air movement and clear to auscultation. Cardiovascular system: S1 & S2  heard, RRR. No JVD. Gastrointestinal system: Abdomen is nondistended, soft and nontender.  Extremities: No pedal edema. Skin: No rashes, lesions or ulcers Psychiatry: Judgement and insight appear normal. Mood & affect appropriate.    Data Reviewed:    Labs: Basic Metabolic Panel: Recent Labs  Lab 08/27/21 0950 08/28/21 0545  NA 132* 134*  K 4.3 4.3  CL 97* 101  CO2 26 24  GLUCOSE 212* 157*  BUN 11 9  CREATININE 0.72 0.67  CALCIUM 8.7* 8.7*   GFR Estimated Creatinine Clearance: 186 mL/min (by C-G formula based on SCr of 0.67 mg/dL). Liver Function Tests: Recent Labs  Lab 08/27/21 0950  AST 20  ALT 26  ALKPHOS 76  BILITOT 0.7  PROT 7.4  ALBUMIN 2.7*   No results for input(s): LIPASE, AMYLASE in the last 168 hours. No results for input(s): AMMONIA in the last 168 hours. Coagulation profile Recent Labs  Lab 08/27/21 0952 08/28/21 0545  INR 3.2* 2.6*   COVID-19 Labs  Recent Labs    08/28/21 0545  CRP 18.5*    Lab Results  Component Value Date   SARSCOV2NAA NEGATIVE 08/27/2021   SARSCOV2NAA NEGATIVE 02/12/2021   SARSCOV2NAA NEGATIVE 12/03/2019   [place] NEGATIVE 11/10/2019    CBC: Recent Labs  Lab 08/27/21 0950 08/28/21 0545  WBC 15.3* 11.3*  NEUTROABS 12.7*  --   HGB 10.7* 11.1*  HCT 33.1* 34.3*  MCV 86.9 84.3  PLT 266 282   Cardiac Enzymes: No results for input(s): CKTOTAL, CKMB, CKMBINDEX, TROPONINI in the last 168 hours. BNP (last 3 results) No results for input(s): PROBNP in the last 8760 hours. CBG: Recent Labs  Lab 08/27/21 2128 08/28/21 0756  GLUCAP 149* 156*   D-Dimer: No results for input(s): DDIMER in the last 72 hours. Hgb A1c: Recent Labs    08/28/21 0545  HGBA1C 7.8*   Lipid Profile: No results for input(s): CHOL, HDL, LDLCALC, TRIG, CHOLHDL, LDLDIRECT in the last 72 hours. Thyroid function studies: No results for input(s): TSH, T4TOTAL, T3FREE, THYROIDAB in the last 72 hours.  Invalid input(s):  FREET3 Anemia work up: No results for input(s): VITAMINB12, FOLATE, FERRITIN, TIBC, IRON, RETICCTPCT in the last 72 hours. Sepsis Labs: Recent Labs  Lab 08/27/21 0950 08/28/21 0545  WBC 15.3* 11.3*   Microbiology Recent Results (from the past 240 hour(s))  Resp Panel by RT-PCR (Flu A&B, Covid) Nasopharyngeal Swab     Status: None   Collection Time: 08/27/21 10:03 AM   Specimen: Nasopharyngeal Swab; Nasopharyngeal(NP) swabs in vial transport medium  Result Value Ref Range Status   SARS Coronavirus 2 by RT PCR NEGATIVE NEGATIVE Final    Comment: (NOTE) SARS-CoV-2 target nucleic acids are NOT DETECTED.  The SARS-CoV-2 RNA is generally detectable in upper respiratory specimens during the acute phase of infection. The lowest concentration of SARS-CoV-2 viral copies this assay can detect is 138 copies/mL. A negative result does not preclude SARS-Cov-2  infection and should not be used as the sole basis for treatment or other patient management decisions. A negative result may occur with  improper specimen collection/handling, submission of specimen other than nasopharyngeal swab, presence of viral mutation(s) within the areas targeted by this assay, and inadequate number of viral copies(<138 copies/mL). A negative result must be combined with clinical observations, patient history, and epidemiological information. The expected result is Negative.  Fact Sheet for Patients:  EntrepreneurPulse.com.au  Fact Sheet for Healthcare Providers:  IncredibleEmployment.be  This test is no t yet approved or cleared by the [place] FDA and  has been authorized for detection and/or diagnosis of SARS-CoV-2 by FDA under an Emergency Use Authorization (EUA). This EUA will remain  in effect (meaning this test can be used) for the duration of the COVID-19 declaration under Section 564(b)(1) of the Act, 21 U.S.C.section 360bbb-3(b)(1), unless the authorization  is terminated  or revoked sooner.       Influenza A by PCR NEGATIVE NEGATIVE Final   Influenza B by PCR NEGATIVE NEGATIVE Final    Comment: (NOTE) The Xpert Xpress SARS-CoV-2/FLU/RSV plus assay is intended as an aid in the diagnosis of influenza from Nasopharyngeal swab specimens and should not be used as a sole basis for treatment. Nasal washings and aspirates are unacceptable for Xpert Xpress SARS-CoV-2/FLU/RSV testing.  Fact Sheet for Patients: EntrepreneurPulse.com.au  Fact Sheet for Healthcare Providers: IncredibleEmployment.be  This test is not yet approved or cleared by the [place] FDA and has been authorized for detection and/or diagnosis of SARS-CoV-2 by FDA under an Emergency Use Authorization (EUA). This EUA will remain in effect (meaning this test can be used) for the duration of the COVID-19 declaration under Section 564(b)(1) of the Act, 21 U.S.C. section 360bbb-3(b)(1), unless the authorization is terminated or revoked.  Performed at [facility], [address], [place], [place] [address]   Culture, blood (routine x 2)     Status: None (Preliminary result)   Collection Time: 08/27/21 10:28 AM   Specimen: Left Antecubital; Blood  Result Value Ref Range Status   Specimen Description LEFT ANTECUBITAL  Final   Special Requests   Final    BOTTLES DRAWN AEROBIC AND ANAEROBIC Blood Culture adequate volume Performed at [facility], [address], [place], [place] [address]    Culture PENDING  Incomplete   Report Status PENDING  Incomplete  Culture, blood (routine x 2)     Status: None (Preliminary result)   Collection Time: 08/27/21 10:40 AM   Specimen: Right Antecubital; Blood  Result Value Ref Range Status   Specimen Description RIGHT ANTECUBITAL  Final   Special Requests   Final    BOTTLES DRAWN AEROBIC AND ANAEROBIC Blood Culture results may not be optimal due to an excessive volume of blood received in culture  bottles   Culture  Setup Time   Final    GRAM POSITIVE COCCI aerobic bottle Gram Stain Report Called to,Read Back By and Verified With: Coleman,V@ [place] 5N'@0244'  by Zigmund Daniel, b 12.2.22 Performed at [facility], [address], [place],  [address]    Culture PENDING  Incomplete   Report Status PENDING  Incomplete     Medications:    atorvastatin  80 mg Oral Daily   influenza vac split quadrivalent PF  0.5 mL Intramuscular Tomorrow-1000   insulin aspart  0-15 Units Subcutaneous TID WC   insulin aspart  0-5 Units Subcutaneous QHS   insulin glargine-yfgn  15 Units Subcutaneous Daily   sertraline  50 mg Oral Daily   Continuous Infusions:  ceFEPime (MAXIPIME) IV Stopped (08/28/21 0349)   vancomycin Stopped (08/28/21 0242)      LOS: 1 day   Charlynne Cousins  [facility]  08/28/2021, 8:06 AM

## 2021-08-28 NOTE — Anesthesia Procedure Notes (Signed)
Anesthesia Regional Block: Popliteal block   Pre-Anesthetic Checklist: , timeout performed,  Correct Patient, Correct Site, Correct Laterality,  Correct Procedure, Correct Position, site marked,  Risks and benefits discussed,  Surgical consent,  Pre-op evaluation,  At surgeon's request and post-op pain management  Laterality: Left  Prep: chloraprep       Needles:  Injection technique: Single-shot  Needle Type: Echogenic Needle     Needle Length: 10cm  Needle Gauge: 21     Additional Needles:   Narrative:  Start time: 08/28/2021 11:10 AM End time: 08/28/2021 11:15 AM Injection made incrementally with aspirations every 5 mL.  Performed by: Personally  Anesthesiologist: Beryle Lathe, MD  Additional Notes: No pain on injection. No increased resistance to injection. Injection made in 5cc increments. Good needle visualization. Patient tolerated the procedure well.

## 2021-08-28 NOTE — Transfer of Care (Signed)
Immediate Anesthesia Transfer of Care Note  Patient: Adam Long  Procedure(s) Performed: AMPUTATION BELOW KNEE (Left: Knee)  Patient Location: PACU  Anesthesia Type:General and Regional  Level of Consciousness: awake, alert  and oriented  Airway & Oxygen Therapy: Patient Spontanous Breathing  Post-op Assessment: Report given to RN and Post -op Vital signs reviewed and stable  Post vital signs: Reviewed and stable  Last Vitals:  Vitals Value Taken Time  BP 114/69 08/28/21 1247  Temp    Pulse 79 08/28/21 1248  Resp 15 08/28/21 1248  SpO2 99 % 08/28/21 1248  Vitals shown include unvalidated device data.  Last Pain:  Vitals:   08/28/21 1042  TempSrc:   PainSc: 0-No pain         Complications: No notable events documented.

## 2021-08-28 NOTE — Anesthesia Procedure Notes (Signed)
Procedure Name: Intubation Date/Time: 08/28/2021 11:53 AM Performed by: Clearnce Sorrel, CRNA Pre-anesthesia Checklist: Patient identified, Emergency Drugs available, Suction available and Patient being monitored Patient Re-evaluated:Patient Re-evaluated prior to induction Oxygen Delivery Method: Circle System Utilized Preoxygenation: Pre-oxygenation with 100% oxygen Induction Type: IV induction and Rapid sequence Laryngoscope Size: Mac and 4 Grade View: Grade I Tube type: Oral Tube size: 7.5 mm Number of attempts: 1 Airway Equipment and Method: Stylet Placement Confirmation: ETT inserted through vocal cords under direct vision, positive ETCO2 and breath sounds checked- equal and bilateral Secured at: 23 cm Tube secured with: Tape Dental Injury: Teeth and Oropharynx as per pre-operative assessment

## 2021-08-28 NOTE — Anesthesia Preprocedure Evaluation (Addendum)
Anesthesia Evaluation  Patient identified by MRN, date of birth, ID band Patient awake    Reviewed: Allergy & Precautions, NPO status , Patient's Chart, lab work & pertinent test results  History of Anesthesia Complications Negative for: history of anesthetic complications  Airway Mallampati: II  TM Distance: >3 FB Neck ROM: Full    Dental  (+) Edentulous Upper, Edentulous Lower   Pulmonary neg pulmonary ROS,    Pulmonary exam normal        Cardiovascular hypertension, Pt. on medications and Pt. on home beta blockers + CAD  Normal cardiovascular exam+ Valvular Problems/Murmurs    '21 TTE - EF 55 to 60%. Mild left ventricular hypertrophy.  Grade I diastolic dysfunction (impaired relaxation). Trivial mitral valve regurgitation. The aortic valve has been repaired/replaced. There is a 25 mm [employer] Top Hat bileaflet mechanical prosthesis in position. Aortic valve regurgitation is trivial. Mean gradient 16 mmHg.    Neuro/Psych PSYCHIATRIC DISORDERS Anxiety Depression negative neurological ROS     GI/Hepatic negative GI ROS, Neg liver ROS,   Endo/Other  diabetes, Poorly Controlled, Type 2, Insulin DependentMorbid obesity  Renal/GU negative Renal ROS     Musculoskeletal negative musculoskeletal ROS (+)   Abdominal (+) + obese,   Peds  Hematology  (+) anemia ,  INR 2.6    Anesthesia Other Findings   Reproductive/Obstetrics                            Anesthesia Physical Anesthesia Plan  ASA: 4  Anesthesia Plan: General   Post-op Pain Management: Regional block   Induction: Intravenous  PONV Risk Score and Plan: 2 and Treatment may vary due to age or medical condition, Ondansetron and Midazolam  Airway Management Planned: LMA  Additional Equipment: None  Intra-op Plan:   Post-operative Plan: Extubation in OR  Informed Consent: I have reviewed the patients History and Physical,  chart, labs and discussed the procedure including the risks, benefits and alternatives for the proposed anesthesia with the patient or authorized representative who has indicated his/her understanding and acceptance.     Dental advisory given  Plan Discussed with: CRNA and Anesthesiologist  Anesthesia Plan Comments:        Anesthesia Quick Evaluation

## 2021-08-28 NOTE — Progress Notes (Signed)
Pharmacy Antibiotic Note  Adam Long is a 44 y.o. male admitted on 08/27/2021 with MSSA bacteremia related to L-foot infection - now s/p trans-tib amp. Pharmacy has been consulted for Cefazolin dosing.  Plan: - D/c Vanc + Cefepime - Start Cefazolin 2g IV every 8 hours - Will continue to follow renal function, culture results, LOT, and antibiotic de-escalation plans   Height: 5\' 10"  (177.8 cm) Weight: (!) 172.4 kg (380 lb 1.2 oz) IBW/kg (Calculated) : 73  Temp (24hrs), Avg:98.3 F (36.8 C), Min:97.6 F (36.4 C), Max:98.9 F (37.2 C)  Recent Labs  Lab 08/27/21 0950 08/28/21 0545  WBC 15.3* 11.3*  CREATININE 0.72 0.67    Estimated Creatinine Clearance: 186 mL/min (by C-G formula based on SCr of 0.67 mg/dL).    Allergies  Allergen Reactions   Pollen Extract Other (See Comments)    Runny nose, watery eyes, sneezing    Antimicrobials this admission: Vanc 12/1 >> 12/2 CTX 12/1 x 1 Cefepime 12/1 >> 12/2 Cefazolin 12/2 >>  Dose adjustments this admission:   Microbiology results: 12/1 COVID/Flu >> neg 12/1 BCx >> 2/4 GPC (BCID MSSA)  Thank you for allowing pharmacy to be a part of this patient's care.  14/1, PharmD, BCPS Clinical Pharmacist 08/28/2021 2:22 PM   **Pharmacist phone directory can now be found on [email or web address] ([email or web address]).  Listed under [facility].

## 2021-08-28 NOTE — Progress Notes (Signed)
PHARMACY - PHYSICIAN COMMUNICATION CRITICAL VALUE ALERT - BLOOD CULTURE IDENTIFICATION (BCID)  Adam Long is an 45 y.o. male who presented to [name] on 08/27/2021 with a chief complaint of L-foot infection  Assessment:  42 YOM with hsitory of L-4th/5th ray amp on 6/15 who presented on 12/1 with continued L-foot infection. Per ortho - not salvageable, now s/p trans-tib amputation 12/2. Blood cultures growing GPC in 2 of 4 bottles with BCID detecting MSSA. The patient has history of a mech AVR  Name of physician (or Provider) Contacted: [name] (automatic ID consult)  Current antibiotics: Vancomycin + Cefepime  Changes to prescribed antibiotics recommended:  Adjust to Cefazolin 2g/8h  Results for orders placed or performed during the hospital encounter of 08/27/21  Blood Culture ID Panel (Reflexed) (Collected: 08/27/2021 10:40 AM)  Result Value Ref Range   Enterococcus faecalis NOT DETECTED NOT DETECTED   Enterococcus Faecium NOT DETECTED NOT DETECTED   Listeria monocytogenes NOT DETECTED NOT DETECTED   Staphylococcus species DETECTED (A) NOT DETECTED   Staphylococcus aureus (BCID) DETECTED (A) NOT DETECTED   Staphylococcus epidermidis NOT DETECTED NOT DETECTED   Staphylococcus lugdunensis NOT DETECTED NOT DETECTED   Streptococcus species NOT DETECTED NOT DETECTED   Streptococcus agalactiae NOT DETECTED NOT DETECTED   Streptococcus pneumoniae NOT DETECTED NOT DETECTED   Streptococcus pyogenes NOT DETECTED NOT DETECTED   A.calcoaceticus-baumannii NOT DETECTED NOT DETECTED   Bacteroides fragilis NOT DETECTED NOT DETECTED   Enterobacterales NOT DETECTED NOT DETECTED   Enterobacter cloacae complex NOT DETECTED NOT DETECTED   Escherichia coli NOT DETECTED NOT DETECTED   Klebsiella aerogenes NOT DETECTED NOT DETECTED   Klebsiella oxytoca NOT DETECTED NOT DETECTED   Klebsiella pneumoniae NOT DETECTED NOT DETECTED   Proteus species NOT DETECTED NOT DETECTED   Salmonella species NOT  DETECTED NOT DETECTED   Serratia marcescens NOT DETECTED NOT DETECTED   Haemophilus influenzae NOT DETECTED NOT DETECTED   Neisseria meningitidis NOT DETECTED NOT DETECTED   Pseudomonas aeruginosa NOT DETECTED NOT DETECTED   Stenotrophomonas maltophilia NOT DETECTED NOT DETECTED   Candida albicans NOT DETECTED NOT DETECTED   Candida auris NOT DETECTED NOT DETECTED   Candida glabrata NOT DETECTED NOT DETECTED   Candida krusei NOT DETECTED NOT DETECTED   Candida parapsilosis NOT DETECTED NOT DETECTED   Candida tropicalis NOT DETECTED NOT DETECTED   Cryptococcus neoformans/gattii NOT DETECTED NOT DETECTED   Meth resistant mecA/C and MREJ NOT DETECTED NOT DETECTED    Thank you for allowing pharmacy to be a part of this patient's care.  Georgina Pillion, PharmD, BCPS Clinical Pharmacist 08/28/2021 2:26 PM   **Pharmacist phone directory can now be found on [email or web address] ([email or web address]).  Listed under [facility].

## 2021-08-28 NOTE — Anesthesia Procedure Notes (Signed)
Anesthesia Regional Block: Adductor canal block   Pre-Anesthetic Checklist: , timeout performed,  Correct Patient, Correct Site, Correct Laterality,  Correct Procedure, Correct Position, site marked,  Risks and benefits discussed,  Surgical consent,  Pre-op evaluation,  At surgeon's request and post-op pain management  Laterality: Left  Prep: chloraprep       Needles:  Injection technique: Single-shot  Needle Type: Echogenic Needle     Needle Length: 10cm  Needle Gauge: 21     Additional Needles:   Narrative:  Start time: 08/28/2021 11:05 AM End time: 08/28/2021 11:08 AM Injection made incrementally with aspirations every 5 mL.  Performed by: Personally  Anesthesiologist: Beryle Lathe, MD  Additional Notes: No pain on injection. No increased resistance to injection. Injection made in 5cc increments. Good needle visualization. Patient tolerated the procedure well.

## 2021-08-28 NOTE — Progress Notes (Signed)
This chaplain responded to the spiritual care contact for creating/updating the Pt. Advance Directive.  The Pt. is awake and willing to participate in AD education with the chaplain. The Pt. is preparing for orthopedic surgery this morning as we speak. The chaplain understands the Pt. Aunt-Linda Zenda Alpers is the Pt. healthcare decision maker without documentation.  The spiritual care department will F/U with completing the Pt. AD post surgery. The Pt. shares he has a community praying for him and a special person he can talk to. The Pt. is hopeful the amputation will lead to a life without foot pain.   The chaplain accepted the Pt. request for intercessory pray and F/U spiritual care.   Chaplain Stephanie Acre [phone]

## 2021-08-28 NOTE — Progress Notes (Signed)
Inpatient Rehab Admissions Coordinator:  Consult received. Awaiting therapy assessments post-surgery to help determine appropriateness of CIR. Will continue to follow.   Wolfgang Phoenix, MS, CCC-SLP Admissions Coordinator [phone]

## 2021-08-29 ENCOUNTER — Inpatient Hospital Stay (HOSPITAL_COMMUNITY): Payer: 59

## 2021-08-29 ENCOUNTER — Encounter (HOSPITAL_COMMUNITY): Payer: Self-pay | Admitting: Orthopedic Surgery

## 2021-08-29 DIAGNOSIS — R7881 Bacteremia: Secondary | ICD-10-CM

## 2021-08-29 DIAGNOSIS — Z952 Presence of prosthetic heart valve: Secondary | ICD-10-CM | POA: Diagnosis not present

## 2021-08-29 DIAGNOSIS — L97529 Non-pressure chronic ulcer of other part of left foot with unspecified severity: Secondary | ICD-10-CM | POA: Diagnosis not present

## 2021-08-29 DIAGNOSIS — E11628 Type 2 diabetes mellitus with other skin complications: Secondary | ICD-10-CM | POA: Diagnosis not present

## 2021-08-29 DIAGNOSIS — I10 Essential (primary) hypertension: Secondary | ICD-10-CM | POA: Diagnosis not present

## 2021-08-29 LAB — CBC
HCT: 31.9 % — ABNORMAL LOW (ref 39.0–52.0)
Hemoglobin: 10.4 g/dL — ABNORMAL LOW (ref 13.0–17.0)
MCH: 27.2 pg (ref 26.0–34.0)
MCHC: 32.6 g/dL (ref 30.0–36.0)
MCV: 83.3 fL (ref 80.0–100.0)
Platelets: 290 10*3/uL (ref 150–400)
RBC: 3.83 MIL/uL — ABNORMAL LOW (ref 4.22–5.81)
RDW: 15.2 % (ref 11.5–15.5)
WBC: 13.1 10*3/uL — ABNORMAL HIGH (ref 4.0–10.5)
nRBC: 0 % (ref 0.0–0.2)

## 2021-08-29 LAB — ECHOCARDIOGRAM COMPLETE
AR max vel: 1.34 cm2
AV Area VTI: 1.35 cm2
AV Area mean vel: 1.27 cm2
AV Mean grad: 19 mmHg
AV Peak grad: 33.5 mmHg
Ao pk vel: 2.9 m/s
Area-P 1/2: 4.46 cm2
Calc EF: 63.2 %
Height: 70 in
S' Lateral: 3.4 cm
Single Plane A2C EF: 63.8 %
Single Plane A4C EF: 64.3 %
Weight: 6081.17 oz

## 2021-08-29 LAB — C-REACTIVE PROTEIN: CRP: 12.8 mg/dL — ABNORMAL HIGH (ref <1.0)

## 2021-08-29 LAB — PROTIME-INR
INR: 2.2 — ABNORMAL HIGH (ref 0.8–1.2)
Prothrombin Time: 24.8 seconds — ABNORMAL HIGH (ref 11.4–15.2)

## 2021-08-29 LAB — GLUCOSE, CAPILLARY
Glucose-Capillary: 136 mg/dL — ABNORMAL HIGH (ref 70–99)
Glucose-Capillary: 127 mg/dL — ABNORMAL HIGH (ref 70–99)
Glucose-Capillary: 169 mg/dL — ABNORMAL HIGH (ref 70–99)
Glucose-Capillary: 155 mg/dL — ABNORMAL HIGH (ref 70–99)
Glucose-Capillary: 186 mg/dL — ABNORMAL HIGH (ref 70–99)

## 2021-08-29 LAB — SEDIMENTATION RATE: Sed Rate: 104 mm/hr — ABNORMAL HIGH (ref 0–16)

## 2021-08-29 MED ORDER — LISINOPRIL 10 MG PO TABS
10.0000 mg | ORAL_TABLET | Freq: Every day | ORAL | Status: DC
  Administered 2021-08-29 – 2021-09-02 (×4): 10 mg via ORAL
  Filled 2021-08-29 (×5): qty 1

## 2021-08-29 MED ORDER — PERFLUTREN LIPID MICROSPHERE
1.0000 mL | INTRAVENOUS | Status: AC | PRN
Start: 2021-08-29 — End: 2021-08-29
  Administered 2021-08-29: 2 mL via INTRAVENOUS
  Filled 2021-08-29: qty 10

## 2021-08-29 MED ORDER — WARFARIN SODIUM 5 MG PO TABS
10.0000 mg | ORAL_TABLET | Freq: Every day | ORAL | Status: DC
  Administered 2021-08-29 – 2021-08-30 (×2): 10 mg via ORAL
  Filled 2021-08-29 (×2): qty 2

## 2021-08-29 NOTE — PMR Pre-admission (Signed)
PMR Admission Coordinator Pre-Admission Assessment  Patient: Adam Long is an 45 y.o., male MRN: [id number] DOB: [birth year] Height: _0  (177.8 cm) Weight: (!) 172.4 kg  Insurance Information HMO:     PPO:      PCP:      IPA:      80/20:      OTHER:  PRIMARY: [place]      Policy#: [id number]      Subscriber: patient CM Name: Anderson Malta      Phone#:      Fax#: [phone] Pre-Cert#: [id number]      Employer:  Benefits:  Phone #: [phone]     Name:  Irene Shipper. Date: 09/27/20-still active     Deduct: $700 ($700 met)      Out of Pocket Max: $2,900 ($2,900 met)      Life Max: NA CIR: 75% coverage, 25% co-insurance      SNF: 75% coverage, 25% co-insurance Outpatient: $30 co-pay     Co-Pay:  Home Health: 75% coverage      Co-Pay: 25% co-insurance  DME: 75% coverage     Co-Pay: 25% co-insurance (RENTAL ONLY) Providers: in-network SECONDARY:       Policy#:      Phone#:   Development worker, community:       Phone#:   The Engineer, petroleum" for patients in Inpatient Rehabilitation Facilities with attached "Privacy Act [place] Records" was provided and verbally reviewed with: Patient  Emergency Contact Information Contact Information     Name Relation Home Work [place] [phone]  [phone]   Turner,Crystal Relative [phone]  [phone]   Rosie Fate   [phone]       Current Medical History  Patient Admitting Diagnosis: diabetic foot infection, s/p L BKA History of Present Illness: Pt is a 45 year old male with medical hx significant for: DM II, HTN, mechanical aortic valve replacement, coronary disease, hyperlipidemia, morbid obesity, left fifth and fourth toe amputation (03/11/21). Pt [facility] to [facility] on 08/[facility]21 d/t worsening left foot ulcer x1 week. Pt had associated left foot pain, drainage, subjective fever, chills. X-ray of left foot showed increasing edema and soft tissue gas about lateral aspect  of left foot. No definite osteomyelitis noted in ED. Pt transferred to [facility] for further treatment. Pt had left transtibial amputation  with application of Prevena wound VAC on 08/28/21. Blood cultures positive for MSSA. ID recommended  at least a month of IV antibiotics. Therapy evaluations completed and CIR recommended d/t pt's deficits in functional mobility and ability to complete ADLs independently.     Patient's medical record from [facility] has been reviewed by the rehabilitation admission coordinator and physician.  Past Medical History  Past Medical History:  Diagnosis Date   Anxiety    Aortic stenosis    Cellulitis and abscess of lower extremity 06/11/2019   Cellulitis of fourth toe of left foot    Cholelithiasis    Coronary artery disease    Nonobstructive CAD (40-50% LAD) 08/2019   Depression    Depression    Phreesia 09/27/2020   Depression    Phreesia 11/01/2020   Diabetes mellitus without complication ([place])    Phreesia 09/27/2020   Elevated troponin level not due myocardial infarction 11/11/2019   Essential hypertension    Gangrene of toe of left foot ([place]) 07/06/2019   Heart murmur    Phreesia 09/27/2020   Hyperlipidemia    Phreesia 09/27/2020   Hypertension  Phreesia 09/27/2020   Mixed hyperlipidemia    Morbid obesity (HCC)    S/P aortic valve replacement with mechanical valve 12/05/2019   25 mm Carbomedics top hat bileaflet mechanical valve via partial upper hemi-sternotomy   Severe aortic stenosis 09/24/2019   Type 2 diabetes mellitus ([place])     Has the patient had major surgery during 100 days prior to admission? Yes  Family History   family history includes Cancer in his mother; Heart disease in his father; Heart murmur in his sister; Hyperlipidemia in his father; Hypertension in his father; Stroke in his father.  Current Medications  Current Facility-Administered Medications:    0.9 %  sodium chloride infusion, ,  Intravenous, Continuous, Newt Minion, MD, Stopped at 08/29/21 [id number]   [DISCONTINUED] acetaminophen (TYLENOL) tablet 650 mg, 650 mg, Oral, Q6H PRN **OR** acetaminophen (TYLENOL) suppository 650 mg, 650 mg, Rectal, Q6H PRN, Newt Minion, MD   acetaminophen (TYLENOL) tablet 325-650 mg, 325-650 mg, Oral, Q6H PRN, Newt Minion, MD   alum & mag hydroxide-simeth (MAALOX/MYLANTA) 200-200-20 MG/5ML suspension 15-30 mL, 15-30 mL, Oral, Q2H PRN, Newt Minion, MD   ascorbic acid (VITAMIN C) tablet 1,000 mg, 1,000 mg, Oral, Daily, Newt Minion, MD, 1,000 mg at 09/02/21 0947   atorvastatin (LIPITOR) tablet 80 mg, 80 mg, Oral, Daily, Newt Minion, MD, 80 mg at 09/02/21 0946   bisacodyl (DULCOLAX) EC tablet 5 mg, 5 mg, Oral, Daily PRN, Newt Minion, MD   ceFAZolin (ANCEF) IVPB 2g/100 mL premix, 2 g, Intravenous, Q8H, Rolla Flatten, [facility], Last Rate: 200 mL/hr at 09/02/21 6314, 2 g at 09/02/21 9702   Chlorhexidine Gluconate Cloth 2 % PADS 6 each, 6 each, Topical, Daily, Charlynne Cousins, MD, 6 each at 09/01/21 1610   docusate sodium (COLACE) capsule 100 mg, 100 mg, Oral, Daily, Newt Minion, MD, 100 mg at 09/02/21 0946   enoxaparin (LOVENOX) injection 150 mg, 150 mg, Subcutaneous, Q12H, Reome, Earle J, RPH, 150 mg at 09/02/21 0947   guaiFENesin-dextromethorphan (ROBITUSSIN DM) 100-10 MG/5ML syrup 15 mL, 15 mL, Oral, Q4H PRN, Newt Minion, MD   hydrALAZINE (APRESOLINE) injection 5 mg, 5 mg, Intravenous, Q20 Min PRN, Newt Minion, MD   HYDROmorphone (DILAUDID) injection 0.5-1 mg, 0.5-1 mg, Intravenous, Q4H PRN, Newt Minion, MD   insulin aspart (novoLOG) injection 0-15 Units, 0-15 Units, Subcutaneous, TID WC, Newt Minion, MD, 2 Units at 09/02/21 0945   insulin aspart (novoLOG) injection 0-5 Units, 0-5 Units, Subcutaneous, QHS, Newt Minion, MD   insulin glargine-yfgn (SEMGLEE) injection 15 Units, 15 Units, Subcutaneous, Daily, Newt Minion, MD, 15 Units at 09/02/21 0946   labetalol  (NORMODYNE) injection 10 mg, 10 mg, Intravenous, Q10 min PRN, Newt Minion, MD   lisinopril (ZESTRIL) tablet 10 mg, 10 mg, Oral, Daily, Aileen Fass, Tammi Klippel, MD, 10 mg at 09/02/21 0946   magnesium citrate solution 1 Bottle, 1 Bottle, Oral, Once PRN, Newt Minion, MD   magnesium sulfate IVPB 2 g 50 mL, 2 g, Intravenous, Daily PRN, Newt Minion, MD   metoprolol tartrate (LOPRESSOR) injection 2-5 mg, 2-5 mg, Intravenous, Q2H PRN, Newt Minion, MD   metoprolol tartrate (LOPRESSOR) tablet 25 mg, 25 mg, Oral, BID, Charlynne Cousins, MD, 25 mg at 09/02/21 0946   mupirocin ointment (BACTROBAN) 2 % 1 application, 1 application, Nasal, BID, Newt Minion, MD, 1 application at 63/78/58 2108   nutrition supplement (JUVEN) (JUVEN) powder packet 1 packet, 1 packet,  Oral, BID BM, Newt Minion, MD, 1 packet at 09/02/21 0947   ondansetron (ZOFRAN) tablet 4 mg, 4 mg, Oral, Q6H PRN **OR** ondansetron (ZOFRAN) injection 4 mg, 4 mg, Intravenous, Q6H PRN, Newt Minion, MD   oxyCODONE (Oxy IR/ROXICODONE) immediate release tablet 10-15 mg, 10-15 mg, Oral, Q4H PRN, Newt Minion, MD, 15 mg at 09/02/21 0944   oxyCODONE (Oxy IR/ROXICODONE) immediate release tablet 5-10 mg, 5-10 mg, Oral, Q4H PRN, Newt Minion, MD, 10 mg at 08/29/21 1013   pantoprazole (PROTONIX) EC tablet 40 mg, 40 mg, Oral, Daily, Newt Minion, MD, 40 mg at 09/02/21 0946   phenol (CHLORASEPTIC) mouth spray 1 spray, 1 spray, Mouth/Throat, PRN, Newt Minion, MD   polyethylene glycol (MIRALAX / GLYCOLAX) packet 17 g, 17 g, Oral, Daily PRN, Newt Minion, MD   potassium chloride SA (KLOR-CON M) CR tablet 20-40 mEq, 20-40 mEq, Oral, Daily PRN, Newt Minion, MD   sertraline (ZOLOFT) tablet 50 mg, 50 mg, Oral, Daily, Newt Minion, MD, 50 mg at 09/02/21 0946   sodium chloride flush (NS) 0.9 % injection 10-40 mL, 10-40 mL, Intracatheter, PRN, Charlynne Cousins, MD   warfarin (COUMADIN) tablet 15 mg, 15 mg, Oral, q1600, Wendee Beavers,  RPH, 15 mg at 09/01/21 1728   Warfarin - Pharmacist Dosing Inpatient, , Does not apply, q1600, Wendee Beavers, [facility], Given at 09/01/21 1733   zinc sulfate capsule 220 mg, 220 mg, Oral, Daily, Newt Minion, MD, 220 mg at 09/02/21 0946  Patients Current Diet:  Diet Order             Diet Carb Modified Fluid consistency: Thin; Room service appropriate? Yes  Diet effective now                   Precautions / Restrictions Precautions Precautions: Fall, Other (comment) Precaution Comments: wound vac Other Brace: limb protector Restrictions Weight Bearing Restrictions: Yes LLE Weight Bearing: Non weight bearing Other Position/Activity Restrictions: L limb protector   Has the patient had 2 or more falls or a fall with injury in the past year? No  Prior Activity Level Community (5-7x/wk): drives, works, gets out of house daily  Prior Functional Level Self Care: Did the patient need help bathing, dressing, using the toilet or eating? Independent  Indoor Mobility: Did the patient need assistance with walking from room to room (with or without device)? Independent  Stairs: Did the patient need assistance with internal or external stairs (with or without device)? Independent  Functional Cognition: Did the patient need help planning regular tasks such as shopping or remembering to take medications? Independent  Patient Information Are you of Hispanic, Latino/a,or Spanish origin?: A. No, not of Hispanic, Latino/a, or Spanish origin What is your race?: A. White Do you need or want an interpreter to communicate with a doctor or health care staff?: 0. No  Patient's Response To:  Health Literacy and Transportation Is the patient able to respond to health literacy and transportation needs?: Yes Health Literacy - How often do you need to have someone help you when you read instructions, pamphlets, or other written material from your doctor or pharmacy?: Never In the past 12 months, has  lack of transportation kept you from medical appointments or from getting medications?: No In the past 12 months, has lack of transportation kept you from meetings, work, or from getting things needed for daily living?: No  Development worker, international aid / [place] Devices/Equipment:  None Home Equipment: Conservation officer, nature (2 wheels), [employer] - quad  Prior Device Use: Indicate devices/aids used by the patient prior to current illness, exacerbation or injury? None of the above  Current Functional Level Cognition  Overall Cognitive Status: Within Functional Limits for tasks assessed Orientation Level: Oriented X4    Extremity Assessment (includes Sensation/Coordination)  Upper Extremity Assessment: Overall WFL for tasks assessed  Lower Extremity Assessment: Defer to PT evaluation LLE Deficits / Details: s/p BKA    ADLs  Overall ADL's : Needs assistance/impaired Eating/Feeding: Independent, Sitting Grooming: Set up, Sitting, Wash/dry hands, Wash/dry face Upper Body Bathing: Set up, Sitting Lower Body Bathing: Maximal assistance, Sitting/lateral leans Upper Body Dressing : Set up, Sitting Lower Body Dressing: Moderate assistance Lower Body Dressing Details (indicate cue type and reason): Able to don sock on R foot at EOB with bed in lowest setting and RLE half-propped on bed. Assist to don underwear for managing wound vac and technique. Also requiring assist to stand and maintain balance to pull underwear over hips. Toilet Transfer: Moderate assistance, Stand-pivot, BSC/3in1, Rolling walker (2 wheels) Toileting- Clothing Manipulation and Hygiene: Maximal assistance, Sitting/lateral lean    Mobility  Overal bed mobility: Needs Assistance Bed Mobility: Sit to Supine, Supine to Sit Supine to sit: Supervision Sit to supine: Supervision General bed mobility comments: +rail, increased time    Transfers  Overall transfer level: Needs assistance Equipment used: Rolling walker (2  wheels) Transfers: Sit to/from Stand, Bed to chair/wheelchair/BSC Sit to Stand: Min assist Bed to/from chair/wheelchair/BSC transfer type:: Stand pivot Stand pivot transfers: Mod assist General transfer comment: cues for technique, assist to rise and steady    Ambulation / Gait / Stairs / Wheelchair Mobility  Ambulation/Gait Ambulation/Gait assistance: Min guard, Min assist, +2 safety/equipment Gait Distance (Feet): 5 Feet (+6') Assistive device: Rolling walker (2 wheels) General Gait Details: Hop to gait with short hops and heavy reliance on UEs. 1 seated rest break due to fatigue. Gait velocity: decreased    Posture / Balance Balance Overall balance assessment: Needs assistance Sitting-balance support: Feet supported, No upper extremity supported Sitting balance-Leahy Scale: Good Standing balance support: During functional activity, Reliant on assistive device for balance Standing balance-Leahy Scale: Poor Standing balance comment: reliant on B UE support    Special needs/care consideration Continuous Drip IV  0.9% sodium chloride infusion 75 mL/hr, Wound Vac Prevena wound VAC on left leg, Skin Amputation: leg/left; Surgical incision: leg/left, and Diabetic management Semglee 15 units daily; novoLOG 0-15 units 3x daily with meals; novoLOG 0-5 units daily at bedtime   Previous Home Environment (from acute therapy documentation) Living Arrangements: Other relatives (lives with uncle)  Lives With: Family (uncle) Available Help at Discharge: Family, Industrial/product designer, Available PRN/intermittently Type of Home: House Home Layout: Two level, Able to live on main level with bedroom/bathroom Alternate Level Stairs-Rails: Right, Left, Can reach both Alternate Level Stairs-Number of Steps: 10 to 15 Home Access: Stairs to enter Entrance Stairs-Rails: Left Entrance Stairs-Number of Steps: 2-3 Bathroom Shower/Tub: Chiropodist: Handicapped height Bathroom Accessibility: Yes How  Accessible: Accessible via wheelchair, Accessible via walker Home Care Services: No  Discharge Living Setting Plans for Discharge Living Setting: Patient's home Type of Home at Discharge: House Discharge Home Layout: Two level, Able to live on main level with bedroom/bathroom Discharge Home Access: Stairs to enter Entrance Stairs-Rails: Left Entrance Stairs-Number of Steps: 2-3 Discharge Bathroom Shower/Tub: Tub/shower unit Discharge Bathroom Toilet: Handicapped height Discharge Bathroom Accessibility: Yes How Accessible: Accessible via wheelchair, Accessible via walker  Does the patient have any problems obtaining your medications?: No  Social/Family/Support Systems Anticipated Caregiver: Toney Rakes, aunt, other family and neighbors Anticipated Caregiver's Contact Information: [phone] Caregiver Availability: Intermittent  Goals Patient/Family Goal for Rehab: Supervision: PT/OT Expected length of stay: 12-14 days Pt/Family Agrees to Admission and willing to participate: Yes Program Orientation Provided & Reviewed with Pt/Caregiver Including Roles  & Responsibilities: Yes  Decrease burden of Care through IP rehab admission: NA  Possible need for SNF placement upon discharge: Not anticipated  Patient Condition: I have reviewed medical records from [facility], spoken with CSW, and patient and family member. I met with patient at the bedside and discussed via phone for inpatient rehabilitation assessment.  Patient will benefit from ongoing PT and OT, can actively participate in 3 hours of therapy a day 5 days of the week, and can make measurable gains during the admission.  Patient will also benefit from the coordinated team approach during an Inpatient Acute Rehabilitation admission.  The patient will receive intensive therapy as well as Rehabilitation physician, nursing, social worker, and care management interventions.  Due to safety, skin/wound care, disease management,  medication administration, pain management, and patient education the patient requires 24 hour a day rehabilitation nursing.  The patient is currently min A-min guard  with mobility and basic ADLs.  Discharge setting and todaytherapy post discharge at home with home health is anticipated.  Patient has agreed to participate in the Acute Inpatient Rehabilitation Program and will admit .  Preadmission Screen Completed By:  Bethel Born, 09/02/2021 9:47 AM ______________________________________________________________________   Discussed status with Dr. Ranell Patrick  on 09/02/21 at 15 and received approval for admission today.  Admission Coordinator:  Bethel Born, CCC-SLP, time 947/Date 09/02/21 with day of updates by Clemens Catholic, MS, CCC-SLP   Assessment/Plan: Diagnosis: BKA Does the need for close, 24 hr/day Medical supervision in concert with the patient's rehab needs make it unreasonable for this patient to be served in a less intensive setting? Yes Co-Morbidities requiring supervision/potential complications: DM2, mechanical aortic valve on warfarin, hypertension, morbid obesity, MSSA cellulitis Due to bladder management, bowel management, safety, skin/wound care, disease management, medication administration, pain management, and patient education, does the patient require 24 hr/day rehab nursing? Yes Does the patient require coordinated care of a physician, rehab nurse, PT, OT to address physical and functional deficits in the context of the above medical diagnosis(es)? Yes Addressing deficits in the following areas: balance, endurance, locomotion, strength, transferring, bowel/bladder control, bathing, dressing, feeding, grooming, toileting, and psychosocial support Can the patient actively participate in an intensive therapy program of at least 3 hrs of therapy 5 days a week? Yes The potential for patient to make measurable gains while on inpatient rehab is excellent Anticipated  functional outcomes upon discharge from inpatient rehab: supervision PT, supervision OT, supervision SLP Estimated rehab length of stay to reach the above functional goals is: 10-14 days Anticipated discharge destination: Home 10. Overall Rehab/Functional Prognosis: excellent   MD Signature: Leeroy Cha, MD

## 2021-08-29 NOTE — Progress Notes (Signed)
[facility] PROGRESS NOTE    Progress Note  Adam Long  JKK:[id number] DOB: 12-Mar-1976 DOA: 08/27/2021 PCP: Heather Roberts, NP     Brief Narrative:   Adam Long is an 45 y.o. male past medical history significant for diabetes mellitus type 2, mechanical aortic valve on warfarin, hypertension morbid obesity comes in with an infected diabetic foot.  Orthopedic surgery was consulted    Assessment/Plan:   Left Diabetic foot infection/MSSA bacteremia: IV antibiotics have been transitioned to IV cefazolin.  BC ID was positive for MSSA. Surveillance blood cultures are pending. ID on board and recommended at least a month of IV antibiotics cannot place PICC line until surveillance blood cultures are back. Orthopedic surgery recommended and he is status post transtibial amputation with application of Prevena wound VAC. Has remained afebrile with my leukocytosis.  Relates her pain is controlled. Physical therapy and Occupational Therapy have been consulted.  Mechanical aortic valve: Hold warfarin started IV heparin. INR this morning is 2.2 we will resume Coumadin .  Uncontrolled diabetes mellitus type 2 with hyperglycemia: Hold oral hypoglycemic agents.  A1c is 7.8. Excellent control with current management.  Essential  HTN (hypertension) Continue metoprolol, blood pressure is trending up resume home dose of lisinopril.  Hyperlipidemia: Excellent continue statins.  Morbid obesity: With a BMI of 52 counseling.   DVT prophylaxis: Hepairn Family Communication:none Status is: Inpatient  Remains inpatient appropriate because: Diabetic foot infection requiring amputation        Code Status:     Code Status Orders  (From admission, onward)           Start     Ordered   08/28/21 0254  Full code  Continuous        08/28/21 0253           Code Status History     Date Active Date Inactive Code Status Order ID Comments User Context   02/12/2021  2321 02/17/2021 1958 Full Code [id number]  Frankey Shown, DO Inpatient   12/05/2019 1444 12/12/2019 1546 Full Code [id number]  Sharlene Dory, PA-C Inpatient   11/11/2019 0042 11/11/2019 1714 Full Code [id number]  Starlyn Skeans, MD Inpatient   09/24/2019 0821 09/24/2019 1538 Full Code [id number]  Tonny Bollman, MD Inpatient   07/06/2019 1835 07/10/2019 1432 Full Code [id number]  Sherren Kerns, MD Inpatient   06/11/2019 1707 06/14/2019 1844 Full Code [id number]  Shon Hale, MD Inpatient   07/02/2013 1545 07/03/2013 1535 Full Code [id number]  Esperanza Sheets, MD ED         IV Access:   Peripheral IV   Procedures and diagnostic studies:   DG Foot Complete Left  Result Date: 08/27/2021 CLINICAL DATA:  Wound EXAM: LEFT FOOT - COMPLETE 3+ VIEW COMPARISON:  Radiograph dated May 13, 2021 FINDINGS: Prior amputations of the fourth and fifth toes with heterotopic ossification. Osseous deformities with heterotopic calcification of the second and third metatarsals due to prior fractures. No definite areas of osseous destruction. Increased soft tissue swelling and new soft tissue gas of the lateral left foot. IMPRESSION: 1. Increased soft tissue swelling and new soft tissue gas of the lateral left foot, findings are concerning for infection with gas-forming organism. 2. No definite osteomyelitis, although chronic changes somewhat limited evaluation. IF there is clinical concern, consider MRI. Electronically Signed   By: Allegra Lai M.D.   On: 08/27/2021 10:24     Medical Consultants:   None.   Subjective:    [name]  Babar wants to get out of bed.  Objective:    Vitals:   08/28/21 1533 08/28/21 1954 08/29/21 0013 08/29/21 0804  BP: 118/67 137/63 (!) 142/72 (!) 154/73  Pulse: 76 70 61 72  Resp: 16 17 18 14   Temp: 98.2 F (36.8 C) 98.4 F (36.9 C) 97.9 F (36.6 C) 98.6 F (37 C)  TempSrc: Oral   Oral  SpO2: 97% 97% 98% 98%  Weight:      Height:       SpO2: 98 % O2  Flow Rate (L/min): 0 L/min FiO2 (%): 21 %   Intake/Output Summary (Last 24 hours) at 08/29/2021 0858 Last data filed at 08/29/2021 [id number] Gross per 24 hour  Intake 2619.12 ml  Output 1100 ml  Net 1519.12 ml    Filed Weights   08/27/21 0853 08/28/21 1023  Weight: (!) 172.4 kg (!) 172.4 kg    Exam: General exam: In no acute distress. Respiratory system: Good air movement and clear to auscultation. Cardiovascular system: S1 & S2 heard, RRR. No JVD. Gastrointestinal system: Abdomen is nondistended, soft and nontender.  Extremities: Right below the knee amputation. Skin: No rashes, lesions or ulcers Psychiatry: Judgement and insight appear normal. Mood & affect appropriate.   Data Reviewed:    Labs: Basic Metabolic Panel: Recent Labs  Lab 08/27/21 0950 08/28/21 0545  NA 132* 134*  K 4.3 4.3  CL 97* 101  CO2 26 24  GLUCOSE 212* 157*  BUN 11 9  CREATININE 0.72 0.67  CALCIUM 8.7* 8.7*    GFR Estimated Creatinine Clearance: 186 mL/min (by C-G formula based on SCr of 0.67 mg/dL). Liver Function Tests: Recent Labs  Lab 08/27/21 0950  AST 20  ALT 26  ALKPHOS 76  BILITOT 0.7  PROT 7.4  ALBUMIN 2.7*    No results for input(s): LIPASE, AMYLASE in the last 168 hours. No results for input(s): AMMONIA in the last 168 hours. Coagulation profile Recent Labs  Lab 08/27/21 0952 08/28/21 0545 08/28/21 0801 08/28/21 1911 08/29/21 0036  INR 3.2* 2.6* 2.6* 2.3* 2.2*    COVID-19 Labs  Recent Labs    08/28/21 0545 08/29/21 0036  CRP 18.5* 12.8*     Lab Results  Component Value Date   SARSCOV2NAA NEGATIVE 08/27/2021   SARSCOV2NAA NEGATIVE 02/12/2021   [place] NEGATIVE 12/03/2019   [place] NEGATIVE 11/10/2019    CBC: Recent Labs  Lab 08/27/21 0950 08/28/21 0545 08/29/21 0036  WBC 15.3* 11.3* 13.1*  NEUTROABS 12.7*  --   --   HGB 10.7* 11.1* 10.4*  HCT 33.1* 34.3* 31.9*  MCV 86.9 84.3 83.3  PLT 266 282 290    Cardiac Enzymes: No results  for input(s): CKTOTAL, CKMB, CKMBINDEX, TROPONINI in the last 168 hours. BNP (last 3 results) No results for input(s): PROBNP in the last 8760 hours. CBG: Recent Labs  Lab 08/28/21 1533 08/28/21 1602 08/28/21 2250 08/29/21 0646 08/29/21 0804  GLUCAP 182* 100* 155* 136* 127*    D-Dimer: No results for input(s): DDIMER in the last 72 hours. Hgb A1c: Recent Labs    08/28/21 0545  HGBA1C 7.8*    Lipid Profile: No results for input(s): CHOL, HDL, LDLCALC, TRIG, CHOLHDL, LDLDIRECT in the last 72 hours. Thyroid function studies: No results for input(s): TSH, T4TOTAL, T3FREE, THYROIDAB in the last 72 hours.  Invalid input(s): FREET3 Anemia work up: No results for input(s): VITAMINB12, FOLATE, FERRITIN, TIBC, IRON, RETICCTPCT in the last 72 hours. Sepsis Labs: Recent Labs  Lab 08/27/21 0950 08/28/21 0545  08/29/21 0036  WBC 15.3* 11.3* 13.1*    Microbiology Recent Results (from the past 240 hour(s))  Resp Panel by RT-PCR (Flu A&B, Covid) Nasopharyngeal Swab     Status: None   Collection Time: 08/27/21 10:03 AM   Specimen: Nasopharyngeal Swab; Nasopharyngeal(NP) swabs in vial transport medium  Result Value Ref Range Status   SARS Coronavirus 2 by RT PCR NEGATIVE NEGATIVE Final    Comment: (NOTE) SARS-CoV-2 target nucleic acids are NOT DETECTED.  The SARS-CoV-2 RNA is generally detectable in upper respiratory specimens during the acute phase of infection. The lowest concentration of SARS-CoV-2 viral copies this assay can detect is 138 copies/mL. A negative result does not preclude SARS-Cov-2 infection and should not be used as the sole basis for treatment or other patient management decisions. A negative result may occur with  improper specimen collection/handling, submission of specimen other than nasopharyngeal swab, presence of viral mutation(s) within the areas targeted by this assay, and inadequate number of viral copies(<138 copies/mL). A negative result must be  combined with clinical observations, patient history, and epidemiological information. The expected result is Negative.  Fact Sheet for Patients:  BloggerCourse.com  Fact Sheet for Healthcare Providers:  SeriousBroker.it  This test is no t yet approved or cleared by the [place] FDA and  has been authorized for detection and/or diagnosis of SARS-CoV-2 by FDA under an Emergency Use Authorization (EUA). This EUA will remain  in effect (meaning this test can be used) for the duration of the COVID-19 declaration under Section 564(b)(1) of the Act, 21 U.S.C.section 360bbb-3(b)(1), unless the authorization is terminated  or revoked sooner.       Influenza A by PCR NEGATIVE NEGATIVE Final   Influenza B by PCR NEGATIVE NEGATIVE Final    Comment: (NOTE) The Xpert Xpress SARS-CoV-2/FLU/RSV plus assay is intended as an aid in the diagnosis of influenza from Nasopharyngeal swab specimens and should not be used as a sole basis for treatment. Nasal washings and aspirates are unacceptable for Xpert Xpress SARS-CoV-2/FLU/RSV testing.  Fact Sheet for Patients: BloggerCourse.com  Fact Sheet for Healthcare Providers: SeriousBroker.it  This test is not yet approved or cleared by the [place] FDA and has been authorized for detection and/or diagnosis of SARS-CoV-2 by FDA under an Emergency Use Authorization (EUA). This EUA will remain in effect (meaning this test can be used) for the duration of the COVID-19 declaration under Section 564(b)(1) of the Act, 21 U.S.C. section 360bbb-3(b)(1), unless the authorization is terminated or revoked.  Performed at [facility], [address], [place], [place] [address]   Culture, blood (routine x 2)     Status: None (Preliminary result)   Collection Time: 08/27/21 10:28 AM   Specimen: Left Antecubital; Blood  Result Value Ref Range Status    Specimen Description LEFT ANTECUBITAL  Final   Special Requests   Final    BOTTLES DRAWN AEROBIC AND ANAEROBIC Blood Culture adequate volume   Culture   Final    NO GROWTH 2 DAYS Performed at [facility], [address], [place], [place] [address]    Report Status PENDING  Incomplete  Culture, blood (routine x 2)     Status: None (Preliminary result)   Collection Time: 08/27/21 10:40 AM   Specimen: Right Antecubital; Blood  Result Value Ref Range Status   Specimen Description   Final    RIGHT ANTECUBITAL Performed at [facility], [address], [place], [place] [address]    Special Requests   Final  BOTTLES DRAWN AEROBIC AND ANAEROBIC Blood Culture results may not be optimal due to an excessive volume of blood received in culture bottles Performed at [facility], [address] [place], [place] [address]    Culture  Setup Time   Final    GRAM POSITIVE COCCI aerobic bottle Gram Stain Report Called to,Read Back By and Verified With: Coleman,V@ [email or web address] [email or web address]  by Zigmund Daniel, b 12.2.22 CRITICAL RESULT CALLED TO, READ BACK BY AND VERIFIED WITH: PHARMD E MARTIN [id number] AT 1306 BY CM Performed at [facility], [place] [address] [place], [place] [address]    Culture GRAM POSITIVE COCCI  Final   Report Status PENDING  Incomplete  Blood Culture ID Panel (Reflexed)     Status: Abnormal   Collection Time: 08/27/21 10:40 AM  Result Value Ref Range Status   Enterococcus faecalis NOT DETECTED NOT DETECTED Final   Enterococcus Faecium NOT DETECTED NOT DETECTED Final   Listeria monocytogenes NOT DETECTED NOT DETECTED Final   Staphylococcus species DETECTED (A) NOT DETECTED Final    Comment: CRITICAL RESULT CALLED TO, READ BACK BY AND VERIFIED WITH: PHARMD E MARTIN [id number] AT 1306 BY CM    Staphylococcus aureus (BCID) DETECTED (A) NOT DETECTED Final    Comment: CRITICAL RESULT CALLED TO, READ BACK BY AND VERIFIED WITH: PHARMD E MARTIN [id number] AT 1306 BY CM    Staphylococcus epidermidis  NOT DETECTED NOT DETECTED Final   Staphylococcus lugdunensis NOT DETECTED NOT DETECTED Final   Streptococcus species NOT DETECTED NOT DETECTED Final   Streptococcus agalactiae NOT DETECTED NOT DETECTED Final   Streptococcus pneumoniae NOT DETECTED NOT DETECTED Final   Streptococcus pyogenes NOT DETECTED NOT DETECTED Final   A.calcoaceticus-baumannii NOT DETECTED NOT DETECTED Final   Bacteroides fragilis NOT DETECTED NOT DETECTED Final   Enterobacterales NOT DETECTED NOT DETECTED Final   Enterobacter cloacae complex NOT DETECTED NOT DETECTED Final   Escherichia coli NOT DETECTED NOT DETECTED Final   Klebsiella aerogenes NOT DETECTED NOT DETECTED Final   Klebsiella oxytoca NOT DETECTED NOT DETECTED Final   Klebsiella pneumoniae NOT DETECTED NOT DETECTED Final   Proteus species NOT DETECTED NOT DETECTED Final   Salmonella species NOT DETECTED NOT DETECTED Final   Serratia marcescens NOT DETECTED NOT DETECTED Final   Haemophilus influenzae NOT DETECTED NOT DETECTED Final   Neisseria meningitidis NOT DETECTED NOT DETECTED Final   Pseudomonas aeruginosa NOT DETECTED NOT DETECTED Final   Stenotrophomonas maltophilia NOT DETECTED NOT DETECTED Final   Candida albicans NOT DETECTED NOT DETECTED Final   Candida auris NOT DETECTED NOT DETECTED Final   Candida glabrata NOT DETECTED NOT DETECTED Final   Candida krusei NOT DETECTED NOT DETECTED Final   Candida parapsilosis NOT DETECTED NOT DETECTED Final   Candida tropicalis NOT DETECTED NOT DETECTED Final   Cryptococcus neoformans/gattii NOT DETECTED NOT DETECTED Final   Meth resistant mecA/C and MREJ NOT DETECTED NOT DETECTED Final    Comment: Performed at [facility], [address], [place], [place] [address]  Surgical PCR screen     Status: Abnormal   Collection Time: 08/28/21  9:41 AM   Specimen: Nasal Mucosa; Nasal Swab  Result Value Ref Range Status   MRSA, PCR NEGATIVE NEGATIVE Final   Staphylococcus aureus POSITIVE (A) NEGATIVE  Final    Comment: (NOTE) The Xpert SA Assay (FDA approved for NASAL specimens in patients 56 years of age and older), is one component of a comprehensive surveillance program. It is not intended to diagnose infection nor  to guide or monitor treatment. Performed at [facility], [place] [address] [place], [place] [address]   Culture, blood (Routine X 2) w Reflex to ID Panel     Status: None (Preliminary result)   Collection Time: 08/28/21  3:31 PM   Specimen: BLOOD  Result Value Ref Range Status   Specimen Description BLOOD RIGHT ARM  Final   Special Requests   Final    BOTTLES DRAWN AEROBIC AND ANAEROBIC Blood Culture adequate volume   Culture   Final    NO GROWTH < 12 HOURS Performed at [facility], [place] [address], [place], [place] [address]    Report Status PENDING  Incomplete  Culture, blood (Routine X 2) w Reflex to ID Panel     Status: None (Preliminary result)   Collection Time: 08/28/21  3:34 PM   Specimen: BLOOD  Result Value Ref Range Status   Specimen Description BLOOD LEFT HAND  Final   Special Requests   Final    BOTTLES DRAWN AEROBIC ONLY Blood Culture adequate volume   Culture   Final    NO GROWTH < 12 HOURS Performed at [facility], [place] [address], [place],  [address]    Report Status PENDING  Incomplete     Medications:    vitamin C  1,000 mg Oral Daily   atorvastatin  80 mg Oral Daily   docusate sodium  100 mg Oral Daily   influenza vac split quadrivalent PF  0.5 mL Intramuscular Tomorrow-1000   insulin aspart  0-15 Units Subcutaneous TID WC   insulin aspart  0-5 Units Subcutaneous QHS   insulin glargine-yfgn  15 Units Subcutaneous Daily   mupirocin ointment  1 application Nasal BID   nutrition supplement (JUVEN)  1 packet Oral BID BM   pantoprazole  40 mg Oral Daily   sertraline  50 mg Oral Daily   warfarin  10 mg Oral q1600   zinc sulfate  220 mg Oral Daily   Continuous Infusions:  sodium chloride Stopped (08/29/21 0535)     ceFAZolin (ANCEF) IV Stopped (08/29/21 0500)   magnesium sulfate bolus IVPB        LOS: 2 days   Charlynne Cousins  [facility]  08/29/2021, 8:58 AM

## 2021-08-29 NOTE — Plan of Care (Signed)

## 2021-08-29 NOTE — Progress Notes (Signed)
ANTICOAGULATION CONSULT NOTE - Initial Consult  Pharmacy Consult for warfarin dosing Indication:  aortic valve replacement. INR goal per outpatient provider: 2-3  Allergies  Allergen Reactions   Pollen Extract Other (See Comments)    Runny nose, watery eyes, sneezing    Patient Measurements: Height: 5\' 10"  (177.8 cm) Weight: (!) 172.4 kg (380 lb 1.2 oz) IBW/kg (Calculated) : 73  Vital Signs: Temp: 97.9 F (36.6 C) (12/03 0013) BP: 142/72 (12/03 0013) Pulse Rate: 61 (12/03 0013)  Labs: Recent Labs    08/27/21 0950 08/27/21 0952 08/28/21 0545 08/28/21 0801 08/28/21 1911 08/29/21 0036  HGB 10.7*  --  11.1*  --   --  10.4*  HCT 33.1*  --  34.3*  --   --  31.9*  PLT 266  --  282  --   --  290  LABPROT  --    < > 27.7* 27.5* 24.9* 24.8*  INR  --    < > 2.6* 2.6* 2.3* 2.2*  CREATININE 0.72  --  0.67  --   --   --    < > = values in this interval not displayed.    Estimated Creatinine Clearance: 186 mL/min (by C-G formula based on SCr of 0.67 mg/dL).   Medical History: Past Medical History:  Diagnosis Date   Anxiety    Aortic stenosis    Cellulitis and abscess of lower extremity 06/11/2019   Cellulitis of fourth toe of left foot    Cholelithiasis    Coronary artery disease    Nonobstructive CAD (40-50% LAD) 08/2019   Depression    Depression    Phreesia 09/27/2020   Depression    Phreesia 11/01/2020   Diabetes mellitus without complication (HCC)    Phreesia 09/27/2020   Elevated troponin level not due myocardial infarction 11/11/2019   Essential hypertension    Gangrene of toe of left foot (HCC) 07/06/2019   Heart murmur    Phreesia 09/27/2020   Hyperlipidemia    Phreesia 09/27/2020   Hypertension    Phreesia 09/27/2020   Mixed hyperlipidemia    Morbid obesity (HCC)    S/P aortic valve replacement with mechanical valve 12/05/2019   25 mm Carbomedics top hat bileaflet mechanical valve via partial upper hemi-sternotomy   Severe aortic stenosis 09/24/2019    Type 2 diabetes mellitus (HCC)     Medications:  Medications Prior to Admission  Medication Sig Dispense Refill Last Dose   atorvastatin (LIPITOR) 80 MG tablet Take 1 tablet (80 mg total) by mouth daily. 90 tablet 3 Past Week   insulin glargine (LANTUS SOLOSTAR) 100 UNIT/ML Solostar Pen Inject 30 Units into the skin at bedtime. 30 mL 3 Past Week   lisinopril (ZESTRIL) 20 MG tablet Take 1 tablet by mouth once daily 90 tablet 0 08/26/2021   metoprolol tartrate (LOPRESSOR) 25 MG tablet Take 1 tablet by mouth twice daily 180 tablet 0 08/26/2021 at 0800   sertraline (ZOLOFT) 50 MG tablet Take 1 tablet by mouth once daily 90 tablet 1 08/26/2021   sitaGLIPtin-metformin (JANUMET) 50-500 MG tablet Take 1 tablet by mouth 2 (two) times daily with a meal. 180 tablet 3 08/26/2021   warfarin (COUMADIN) 10 MG tablet Take 1 tablet daily. 45 tablet 5 08/25/2021 at 1800    Assessment: 45 year-old male patient who is s/p ortho surgery requiring anticoagulation for M-AVR restarting his home dose of coumadin  Goal of Therapy:  INR 2-3 (per outpatient provider) Monitor platelets by anticoagulation protocol: Yes  Plan:  Current INR is 2.2 Continue with home dose of coumadin (10 mg daily) Monitor INR daily until 2 INR in a row are therapeutic, then reduce frequency to Monday, Wednesday, and Friday only  Jilleen Essner BS, PharmD, BCPS Clinical Pharmacist 08/29/2021,7:51 AM

## 2021-08-29 NOTE — Evaluation (Addendum)
Physical Therapy Evaluation Patient Details Name: Adam Long MRN: [id number] DOB: Nov 01, 1975 Today's Date: 08/29/2021  History of Present Illness  Pt is a 45 y.o. male admitted 12/1 with L foot infection. He underwent L BKA 12/2. PMH: diabetes mellitus type 2, mechanical aortic valve replacement on warfarin, coronary disease, hypertension, hyperlipidemia, morbid obesity   Clinical Impression  Pt admitted with above diagnosis. PTA pt active and independent. On eval, pt required min guard assist bed mobility, mod assist sit to stand, and mod assist SPT with RW.  Pt will benefit from skilled PT to increase their independence and safety with mobility to allow discharge to the venue listed below.  Pt has 2 steps to enter his house.         Recommendations for follow up therapy are one component of a multi-disciplinary discharge planning process, led by the attending physician.  Recommendations may be updated based on patient status, additional functional criteria and insurance authorization.  Follow Up Recommendations Acute inpatient rehab (3hours/day)    Assistance Recommended at Discharge Frequent or constant Supervision/Assistance  Functional Status Assessment Patient has had a recent decline in their functional status and demonstrates the ability to make significant improvements in function in a reasonable and predictable amount of time.  Equipment Recommendations  Wheelchair (measurements PT);BSC/3in1    Recommendations for Other Services Rehab consult     Precautions / Restrictions Precautions Precautions: Fall;Other (comment) Precaution Comments: wound vac Restrictions LLE Weight Bearing: Non weight bearing Other Position/Activity Restrictions: L limb protector      Mobility  Bed Mobility Overal bed mobility: Needs Assistance Bed Mobility: Supine to Sit     Supine to sit: Min guard;HOB elevated     General bed mobility comments: +rail, increased time     Transfers Overall transfer level: Needs assistance Equipment used: Rolling walker (2 wheels) Transfers: Sit to/from Stand;Bed to chair/wheelchair/BSC Sit to Stand: Mod assist Stand pivot transfers: Mod assist         General transfer comment: assist to power up and stabilize balance. Stand pivot to recliner using RW. Increased time. Cues for sequencing.    Ambulation/Gait               General Gait Details: unable  Stairs            Wheelchair Mobility    Modified Rankin (Stroke Patients Only)       Balance Overall balance assessment: Needs assistance Sitting-balance support: Feet supported;No upper extremity supported Sitting balance-Leahy Scale: Good     Standing balance support: Bilateral upper extremity supported;During functional activity;Reliant on assistive device for balance Standing balance-Leahy Scale: Poor                               Pertinent Vitals/Pain Pain Assessment: Faces Faces Pain Scale: Hurts even more Pain Location: L residual limb Pain Descriptors / Indicators: Grimacing;Guarding;Discomfort Pain Intervention(s): Monitored during session;Repositioned;Premedicated before session    Home Living Family/patient expects to be discharged to:: Private residence Living Arrangements: Non-relatives/Friends;Other relatives Available Help at Discharge: Family;Friend(s);Available 24 hours/day Type of Home: House Home Access: Stairs to enter Entrance Stairs-Rails: Left Entrance Stairs-Number of Steps: 4 Alternate Level Stairs-Number of Steps: 10 to 15 Home Layout: Two level;Able to live on main level with bedroom/bathroom Home Equipment: Rolling Walker (2 wheels);Cane - quad      Prior Function Prior Level of Function : Independent/Modified Independent;Working/employed;Driving  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   Dominant Hand: Right    Extremity/Trunk Assessment    Upper Extremity Assessment Upper Extremity Assessment: Overall WFL for tasks assessed    Lower Extremity Assessment Lower Extremity Assessment: LLE deficits/detail LLE Deficits / Details: s/p BKA    Cervical / Trunk Assessment Cervical / Trunk Assessment: Normal  Communication   Communication: No difficulties  Cognition Arousal/Alertness: Awake/alert Behavior During Therapy: WFL for tasks assessed/performed Overall Cognitive Status: Within Functional Limits for tasks assessed                                          General Comments General comments (skin integrity, edema, etc.): VSS on RA    Exercises     Assessment/Plan    PT Assessment Patient needs continued PT services  PT Problem List Decreased mobility;Decreased knowledge of precautions;Decreased activity tolerance;Pain;Decreased balance;Decreased knowledge of use of DME       PT Treatment Interventions DME instruction;Therapeutic activities;Gait training;Therapeutic exercise;Patient/family education;Balance training;Functional mobility training;Stair training;Wheelchair mobility training    PT Goals (Current goals can be found in the Care Plan section)  Acute Rehab PT Goals Patient Stated Goal: heal and get a prosthesis PT Goal Formulation: With patient Time For Goal Achievement: 09/12/21 Potential to Achieve Goals: Good    Frequency Min 3X/week   Barriers to discharge        Co-evaluation               AM-PAC PT "6 Clicks" Mobility  Outcome Measure Help needed turning from your back to your side while in a flat bed without using bedrails?: A Little Help needed moving from lying on your back to sitting on the side of a flat bed without using bedrails?: A Lot Help needed moving to and from a bed to a chair (including a wheelchair)?: A Lot Help needed standing up from a chair using your arms (e.g., wheelchair or bedside chair)?: A Lot Help needed to walk in hospital room?:  Total Help needed climbing 3-5 steps with a railing? : Total 6 Click Score: 11    End of Session Equipment Utilized During Treatment: Gait belt;Other (comment) (LLE limb protector) Activity Tolerance: Patient tolerated treatment well Patient left: in chair;with call bell/phone within reach Nurse Communication: Mobility status PT Visit Diagnosis: Other abnormalities of gait and mobility (R26.89);Pain Pain - Right/Left: Left Pain - part of body: Leg    Time: 1013-1040 PT Time Calculation (min) (ACUTE ONLY): 27 min   Charges:   PT Evaluation $PT Eval Moderate Complexity: 1 Mod PT Treatments $Therapeutic Activity: 8-22 mins        Aida Raider, PT  Office # [phone] Pager [phone]   Ilda Foil 08/29/2021, 10:44 AM

## 2021-08-29 NOTE — Evaluation (Signed)
Occupational Therapy Evaluation Patient Details Name: Adam Long MRN: [id number] DOB: 1975/10/26 Today's Date: 08/29/2021   History of Present Illness Pt is a 45 y.o. male admitted 12/1 with L foot infection. He underwent L BKA 12/2. PMH: diabetes mellitus type 2, mechanical aortic valve replacement on warfarin, coronary disease, hypertension, hyperlipidemia, morbid obesity   Clinical Impression   Pt was functioning independent prior to admission. Presents with impaired standing balance and post operative pain. Pt demonstrated ability to stand with moderate assistance with RW. He needs set up to max assist for ADL. Began educating pt in donning and doffing limb protector and compensatory strategies for ADL from a seated position. Pt attentive and receptive. He wants to learn to walk with a prosthesis and return to work. Recommending intensive rehab. Will follow acutely.      Recommendations for follow up therapy are one component of a multi-disciplinary discharge planning process, led by the attending physician.  Recommendations may be updated based on patient status, additional functional criteria and insurance authorization.   Follow Up Recommendations  Acute inpatient rehab (3hours/day)    Assistance Recommended at Discharge Frequent or constant Supervision/Assistance  Functional Status Assessment  Patient has had a recent decline in their functional status and demonstrates the ability to make significant improvements in function in a reasonable and predictable amount of time.  Equipment Recommendations  BSC/3in1;Wheelchair (measurements OT);Wheelchair cushion (measurements OT)    Recommendations for Other Services       Precautions / Restrictions Precautions Precautions: Fall;Other (comment) Precaution Comments: wound vac Required Braces or Orthoses: Other Brace Other Brace: limb protector Restrictions Weight Bearing Restrictions: Yes LLE Weight Bearing: Non weight  bearing Other Position/Activity Restrictions: L limb protector      Mobility Bed Mobility      General bed mobility comments: pt received in chair    Transfers Overall transfer level: Needs assistance Equipment used: Rolling walker (2 wheels) Transfers: Sit to/from Stand Sit to Stand: Mod assist          General transfer comment: cues for technique, assist to rise and steady      Balance Overall balance assessment: Needs assistance Sitting-balance support: Feet supported;No upper extremity supported Sitting balance-Leahy Scale: Good     Standing balance support: Bilateral upper extremity supported;During functional activity;Reliant on assistive device for balance Standing balance-Leahy Scale: Poor Standing balance comment: reliant on B UE support                           ADL either performed or assessed with clinical judgement   ADL Overall ADL's : Needs assistance/impaired Eating/Feeding: Independent;Sitting   Grooming: Set up;Sitting;Wash/dry hands;Wash/dry face   Upper Body Bathing: Set up;Sitting   Lower Body Bathing: Maximal assistance;Sitting/lateral leans   Upper Body Dressing : Set up;Sitting   Lower Body Dressing: Maximal assistance;Sitting/lateral leans   Toilet Transfer: Moderate assistance;Stand-pivot;BSC/3in1;Rolling walker (2 wheels)   Toileting- Clothing Manipulation and Hygiene: Maximal assistance;Sitting/lateral lean               Vision Baseline Vision/History: 1 Wears glasses Ability to See in Adequate Light: 0 Adequate Patient Visual Report: No change from baseline       Perception     Praxis      Pertinent Vitals/Pain Pain Assessment: 0-10 Faces Pain Scale: Hurts little more Pain Location: L residual limb Pain Descriptors / Indicators: Grimacing;Guarding;Discomfort Pain Intervention(s): Monitored during session;Repositioned     Hand Dominance Right   Extremity/Trunk Assessment  Upper Extremity  Assessment Upper Extremity Assessment: Overall WFL for tasks assessed   Lower Extremity Assessment Lower Extremity Assessment: Defer to PT evaluation LLE Deficits / Details: s/p BKA   Cervical / Trunk Assessment Cervical / Trunk Assessment: Normal;Other exceptions (increased body habitus)   Communication Communication Communication: No difficulties   Cognition Arousal/Alertness: Awake/alert Behavior During Therapy: WFL for tasks assessed/performed Overall Cognitive Status: Within Functional Limits for tasks assessed                                       General Comments  VSS on RA    Exercises     Shoulder Instructions      Home Living Family/patient expects to be discharged to:: Private residence Living Arrangements: Non-relatives/Friends;Other relatives Available Help at Discharge: Family;Friend(s);Available 24 hours/day Type of Home: House Home Access: Stairs to enter [employer] of Steps: 4 Entrance Stairs-Rails: Left Home Layout: Two level;Able to live on main level with bedroom/bathroom Alternate Level Stairs-Number of Steps: 10 to 15 Alternate Level Stairs-Rails: Right;Left;Can reach both Bathroom Shower/Tub: Chief Strategy Officer: Handicapped height Bathroom Accessibility: Yes   Home Equipment: Agricultural consultant (2 wheels);Cane - quad          Prior Functioning/Environment Prior Level of Function : Independent/Modified Independent;Working/employed;Driving             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 Problem List: Impaired balance (sitting and/or standing);Decreased knowledge of use of DME or AE;Pain;Obesity;Decreased strength      OT Treatment/Interventions: Self-care/ADL training;DME and/or AE instruction;Therapeutic activities;Patient/family education;Balance training    OT Goals(Current goals can be found in the care plan section) Acute Rehab OT Goals OT Goal Formulation: With  patient Time For Goal Achievement: 09/12/21 Potential to Achieve Goals: Good ADL Goals Pt Will Perform Lower Body Bathing: with set-up;sitting/lateral leans Pt Will Perform Lower Body Dressing: with set-up;sitting/lateral leans Pt Will Transfer to Toilet: with supervision;ambulating;bedside commode (over toilet) Pt Will Perform Toileting - Clothing Manipulation and hygiene: sitting/lateral leans;with set-up  OT Frequency: Min 2X/week   Barriers to D/C:            Co-evaluation              AM-PAC OT "6 Clicks" Daily Activity     Outcome Measure Help from another person eating meals?: None Help from another person taking care of personal grooming?: A Little Help from another person toileting, which includes using toliet, bedpan, or urinal?: A Lot Help from another person bathing (including washing, rinsing, drying)?: A Lot Help from another person to put on and taking off regular upper body clothing?: A Little Help from another person to put on and taking off regular lower body clothing?: A Lot 6 Click Score: 16   End of Session Equipment Utilized During Treatment: Rolling walker (2 wheels);Gait belt  Activity Tolerance: Patient tolerated treatment well Patient left: in chair;with call bell/phone within reach  OT Visit Diagnosis: Unsteadiness on feet (R26.81);Pain;Muscle weakness (generalized) (M62.81)                Time: 4492-0100 OT Time Calculation (min): 32 min Charges:  OT General Charges $OT Visit: 1 Visit OT Evaluation $OT Eval Moderate Complexity: 1 Mod OT Treatments $Self Care/Home Management : 8-22 mins  Martie Round, OTR/L Acute Rehabilitation Services Pager: [phone] Office: [phone]  Martie Round  Larita Fife 08/29/2021, 11:56 AM

## 2021-08-29 NOTE — Progress Notes (Signed)
Patient ID: Adam Long, male   DOB: [birth year], 45 y.o.   MRN: [id number] Patient is postoperative day 1 transtibial amputation.  There is no drainage in the wound VAC canister there is a good seal.  Patient has no complaints this morning.  Anticipate discharge planning based on patient's progress with therapy and their recommendations.

## 2021-08-29 NOTE — Progress Notes (Signed)
Inpatient Rehab Admissions:  Inpatient Rehab Consult received.  I met with patient at the bedside for rehabilitation assessment and to discuss goals and expectations of an inpatient rehab admission.  Pt acknowledged understanding of CIR goals and expectations. Pt interested in pursuing CIR. Pt would like to be the one who informs family about CIR. Will continue to follow.  Signed: Gayland Curry, [place], [place] Admissions Coordinator [phone]

## 2021-08-30 DIAGNOSIS — I10 Essential (primary) hypertension: Secondary | ICD-10-CM | POA: Diagnosis not present

## 2021-08-30 DIAGNOSIS — Z952 Presence of prosthetic heart valve: Secondary | ICD-10-CM | POA: Diagnosis not present

## 2021-08-30 DIAGNOSIS — E11628 Type 2 diabetes mellitus with other skin complications: Secondary | ICD-10-CM | POA: Diagnosis not present

## 2021-08-30 DIAGNOSIS — L97529 Non-pressure chronic ulcer of other part of left foot with unspecified severity: Secondary | ICD-10-CM | POA: Diagnosis not present

## 2021-08-30 LAB — CBC
HCT: 33.4 % — ABNORMAL LOW (ref 39.0–52.0)
HCT: 33.8 % — ABNORMAL LOW (ref 39.0–52.0)
Hemoglobin: 10.7 g/dL — ABNORMAL LOW (ref 13.0–17.0)
Hemoglobin: 11.2 g/dL — ABNORMAL LOW (ref 13.0–17.0)
MCH: 27 pg (ref 26.0–34.0)
MCH: 27.6 pg (ref 26.0–34.0)
MCHC: 32 g/dL (ref 30.0–36.0)
MCHC: 33.1 g/dL (ref 30.0–36.0)
MCV: 84.3 fL (ref 80.0–100.0)
MCV: 83.3 fL (ref 80.0–100.0)
Platelets: 322 10*3/uL (ref 150–400)
Platelets: 371 10*3/uL (ref 150–400)
RBC: 3.96 MIL/uL — ABNORMAL LOW (ref 4.22–5.81)
RBC: 4.06 MIL/uL — ABNORMAL LOW (ref 4.22–5.81)
RDW: 15.2 % (ref 11.5–15.5)
RDW: 15.2 % (ref 11.5–15.5)
WBC: 13.4 10*3/uL — ABNORMAL HIGH (ref 4.0–10.5)
WBC: 12.3 10*3/uL — ABNORMAL HIGH (ref 4.0–10.5)
nRBC: 0 % (ref 0.0–0.2)
nRBC: 0 % (ref 0.0–0.2)

## 2021-08-30 LAB — GLUCOSE, CAPILLARY
Glucose-Capillary: 155 mg/dL — ABNORMAL HIGH (ref 70–99)
Glucose-Capillary: 146 mg/dL — ABNORMAL HIGH (ref 70–99)
Glucose-Capillary: 173 mg/dL — ABNORMAL HIGH (ref 70–99)
Glucose-Capillary: 141 mg/dL — ABNORMAL HIGH (ref 70–99)

## 2021-08-30 LAB — PROTIME-INR
INR: 1.4 — ABNORMAL HIGH (ref 0.8–1.2)
Prothrombin Time: 17.1 seconds — ABNORMAL HIGH (ref 11.4–15.2)

## 2021-08-30 MED ORDER — ENOXAPARIN SODIUM 150 MG/ML IJ SOSY
150.0000 mg | PREFILLED_SYRINGE | Freq: Two times a day (BID) | INTRAMUSCULAR | Status: DC
  Administered 2021-08-30 – 2021-09-02 (×7): 150 mg via SUBCUTANEOUS
  Filled 2021-08-30 (×8): qty 1

## 2021-08-30 MED ORDER — ENOXAPARIN SODIUM 300 MG/3ML IJ SOLN
1.0000 mg/kg | Freq: Two times a day (BID) | INTRAMUSCULAR | Status: DC
Start: 2021-08-30 — End: 2021-08-30
  Filled 2021-08-30 (×2): qty 1.7

## 2021-08-30 MED ORDER — METOPROLOL TARTRATE 25 MG PO TABS
25.0000 mg | ORAL_TABLET | Freq: Two times a day (BID) | ORAL | Status: DC
  Administered 2021-08-30 – 2021-09-02 (×5): 25 mg via ORAL
  Filled 2021-08-30 (×7): qty 1

## 2021-08-30 NOTE — Progress Notes (Signed)
Physical Therapy Treatment Patient Details Name: Adam Long MRN: [id number] DOB: 11/26/1975 Today's Date: 08/30/2021   History of Present Illness Pt is a 45 y.o. male admitted 12/1 with L foot infection. He underwent L BKA 12/2. PMH: diabetes mellitus type 2, mechanical aortic valve replacement on warfarin, coronary disease, hypertension, hyperlipidemia, morbid obesity    PT Comments    Pt performed LE exercises supine and sitting. Min guard assist supine to sit, mod assist sit to stand with RW, and mod assist SPT with RW bed to recliner. Pt positioned in recliner, foot elevated. RN educated on return transfer to bed.     Recommendations for follow up therapy are one component of a multi-disciplinary discharge planning process, led by the attending physician.  Recommendations may be updated based on patient status, additional functional criteria and insurance authorization.  Follow Up Recommendations  Acute inpatient rehab (3hours/day)     Assistance Recommended at Discharge Frequent or constant Supervision/Assistance  Equipment Recommendations  Wheelchair (measurements PT);BSC/3in1    Recommendations for Other Services       Precautions / Restrictions Precautions Precautions: Fall;Other (comment) Precaution Comments: wound vac Required Braces or Orthoses: Other Brace Other Brace: limb protector Restrictions LLE Weight Bearing: Non weight bearing     Mobility  Bed Mobility Overal bed mobility: Needs Assistance Bed Mobility: Supine to Sit     Supine to sit: Min guard;HOB elevated     General bed mobility comments: +rail, increased time    Transfers Overall transfer level: Needs assistance Equipment used: Rolling walker (2 wheels) Transfers: Sit to/from Stand;Bed to chair/wheelchair/BSC Sit to Stand: Mod assist Stand pivot transfers: Mod assist         General transfer comment: cues for technique, assist to rise and steady    Ambulation/Gait                    Stairs             Wheelchair Mobility    Modified Rankin (Stroke Patients Only)       Balance Overall balance assessment: Needs assistance Sitting-balance support: Feet supported;No upper extremity supported Sitting balance-Leahy Scale: Good     Standing balance support: Bilateral upper extremity supported;During functional activity;Reliant on assistive device for balance Standing balance-Leahy Scale: Poor                              Cognition Arousal/Alertness: Awake/alert Behavior During Therapy: WFL for tasks assessed/performed Overall Cognitive Status: Within Functional Limits for tasks assessed                                          Exercises Amputee Exercises Quad Sets: AROM;Left;10 reps;Supine Hip ABduction/ADduction: AROM;Left;10 reps;Supine Knee Flexion: AROM;Left;Right;10 reps;Seated Knee Extension: AROM;Right;Left;10 reps;Seated Straight Leg Raises: AROM;Left;10 reps;Supine    General Comments        Pertinent Vitals/Pain Pain Assessment: 0-10 Pain Score: 3  Pain Location: L residual limb Pain Descriptors / Indicators: Discomfort Pain Intervention(s): Monitored during session;Repositioned    Home Living                          Prior Function            PT Goals (current goals can now be found in the care plan section) Acute Rehab  PT Goals Patient Stated Goal: heal and get a prosthesis Progress towards PT goals: Progressing toward goals    Frequency    Min 3X/week      PT Plan Current plan remains appropriate    Co-evaluation              AM-PAC PT "6 Clicks" Mobility   Outcome Measure  Help needed turning from your back to your side while in a flat bed without using bedrails?: A Little Help needed moving from lying on your back to sitting on the side of a flat bed without using bedrails?: A Little Help needed moving to and from a bed to a chair (including a  wheelchair)?: A Lot Help needed standing up from a chair using your arms (e.g., wheelchair or bedside chair)?: A Lot Help needed to walk in hospital room?: Total Help needed climbing 3-5 steps with a railing? : Total 6 Click Score: 12    End of Session Equipment Utilized During Treatment: Gait belt;Other (comment) (LLE limb protector) Activity Tolerance: Patient tolerated treatment well Patient left: in chair;with call bell/phone within reach Nurse Communication: Mobility status PT Visit Diagnosis: Other abnormalities of gait and mobility (R26.89);Pain Pain - Right/Left: Left Pain - part of body: Leg     Time: 3762-8315 PT Time Calculation (min) (ACUTE ONLY): 25 min  Charges:  $Therapeutic Exercise: 8-22 mins $Therapeutic Activity: 8-22 mins                     Aida Raider, PT  Office # [phone] Pager [phone]    Ilda Foil 08/30/2021, 2:22 PM

## 2021-08-30 NOTE — Progress Notes (Addendum)
[facility] PROGRESS NOTE    Progress Note  Adam Long  VOH:[id number] DOB: 01-14-1976 DOA: 08/27/2021 PCP: Heather Roberts, NP     Brief Narrative:   Adam Long is an 45 y.o. male past medical history significant for diabetes mellitus type 2, mechanical aortic valve on warfarin, hypertension morbid obesity comes in with an infected diabetic foot.  Orthopedic surgery was consulted    Assessment/Plan:   Left Diabetic foot infection/MSSA bacteremia: Continue IV cefazolin.  BC ID was positive for MSSA. Orthopedic surgery recommended and he is status post transtibial amputation with application of Prevena wound VAC. Surveillance blood cultures are pending. ID on board and recommended at least 1 month of IV antibiotics. Surveillance blood cultures remain negative till date. Physical therapy and Occupational Therapy evaluated the patient recommended inpatient rehab  Mechanical aortic valve: INR subtherapeutic with mechanical valve we will start IV Lovenox continue Coumadin per pharmacy.  Uncontrolled diabetes mellitus type 2 with hyperglycemia: A1c is 7.8. Blood glucose fairly controlled continue current regimen.  We will start transitioning to his home regimen if his blood glucose start to increase.  Essential  HTN (hypertension) Fairly controlled continue current regimen.  Hyperlipidemia: Excellent continue statins.  Morbid obesity: With a BMI of 52 counseling.   DVT prophylaxis: [place] Family Communication:none Status is: Inpatient  Remains inpatient appropriate because: Diabetic foot infection requiring amputation        Code Status:     Code Status Orders  (From admission, onward)           Start     Ordered   08/28/21 0254  Full code  Continuous        08/28/21 0253           Code Status History     Date Active Date Inactive Code Status Order ID Comments User Context   02/12/2021 2321 02/17/2021 1958 Full Code [id number]   Frankey Shown, DO Inpatient   12/05/2019 1444 12/12/2019 1546 Full Code [id number]  Sharlene Dory, PA-C Inpatient   11/11/2019 0042 11/11/2019 1714 Full Code [id number]  Starlyn Skeans, MD Inpatient   09/24/2019 0821 09/24/2019 1538 Full Code [id number]  Tonny Bollman, MD Inpatient   07/06/2019 1835 07/10/2019 1432 Full Code [id number]  Sherren Kerns, MD Inpatient   06/11/2019 1707 06/14/2019 1844 Full Code [id number]  Shon Hale, MD Inpatient   07/02/2013 1545 07/03/2013 1535 Full Code [id number]  Esperanza Sheets, MD ED         IV Access:   Peripheral IV   Procedures and diagnostic studies:   ECHOCARDIOGRAM COMPLETE  Result Date: 08/29/2021    ECHOCARDIOGRAM REPORT   Patient Name:   Adam Long Date of Exam: 08/29/2021 Medical Rec #:  [id number]       Height:       70.0 in Accession #:    [id number]      Weight:       380.1 lb Date of Birth:  1976/05/04       BSA:          2.742 m Patient Age:    45 years        BP:           137/63 mmHg Patient Gender: M               HR:           95 bpm. Exam Location:  Inpatient Procedure: 2D Echo, Color Doppler, Cardiac Doppler and Intracardiac  Opacification Agent Indications:    Bacteremia  History:        Patient has prior history of Echocardiogram examinations. Risk                 Factors:Hypertension and Diabetes.                 Aortic Valve: 25 mm mechanical valve is present in the aortic                 position.  Sonographer:    Jyl Heinz Referring Phys: [place]  1. Left ventricular ejection fraction, by estimation, is 60 to 65%. The left ventricle has normal function. The left ventricle has no regional wall motion abnormalities. There is mild left ventricular hypertrophy. Left ventricular diastolic parameters were normal.  2. Right ventricular systolic function is normal. The right ventricular size is normal.  3. Trivial mitral valve regurgitation. Moderate mitral annular calcification.  4. 25  mm [employer] Top Hat bileaflet mechanical prosthesis present Peak and mean gradients through the valve are 34 and 20 mm Hg respectively. COmpared to study from 2021, mean gradient is mildly increased. Leaflets not well seen . The aortic valve has been repaired/replaced. Aortic valve regurgitation is not visualized. There is a 25 mm mechanical valve present in the aortic position.  5. The inferior vena cava is normal in size with greater than 50% respiratory variability, suggesting right atrial pressure of 3 mmHg. FINDINGS  Left Ventricle: Left ventricular ejection fraction, by estimation, is 60 to 65%. The left ventricle has normal function. The left ventricle has no regional wall motion abnormalities. The left ventricular internal cavity size was normal in size. There is  mild left ventricular hypertrophy. Left ventricular diastolic parameters were normal. Right Ventricle: The right ventricular size is normal. Right vetricular wall thickness was not assessed. Right ventricular systolic function is normal. Left Atrium: Left atrial size was normal in size. Right Atrium: Right atrial size was normal in size. Pericardium: There is no evidence of pericardial effusion. Mitral Valve: There is mild thickening of the mitral valve leaflet(s). Moderate mitral annular calcification. Trivial mitral valve regurgitation. Tricuspid Valve: The tricuspid valve is grossly normal. Tricuspid valve regurgitation is trivial. Aortic Valve: 25 mm [employer] Top Hat bileaflet mechanical prosthesis present Peak and mean gradients through the valve are 34 and 20 mm Hg respectively. COmpared to study from 2021, mean gradient is mildly increased. Leaflets not well seen. The aortic  valve has been repaired/replaced. Aortic valve regurgitation is not visualized. Aortic valve mean gradient measures 19.0 mmHg. Aortic valve peak gradient measures 33.5 mmHg. Aortic valve area, by VTI measures 1.35 cm. There is a 25 mm mechanical valve present in  the aortic position. Pulmonic Valve: The pulmonic valve was normal in structure. Pulmonic valve regurgitation is not visualized. Aorta: The aortic root and ascending aorta are structurally normal, with no evidence of dilitation. Venous: The inferior vena cava is normal in size with greater than 50% respiratory variability, suggesting right atrial pressure of 3 mmHg. IAS/Shunts: No atrial level shunt detected by color flow Doppler.  LEFT VENTRICLE PLAX 2D LVIDd:         5.10 cm      Diastology LVIDs:         3.40 cm      LV e' medial:    6.64 cm/s LV PW:         1.40 cm      LV E/e' medial:  12.2 LV IVS:        1.20 cm      LV e' lateral:   6.64 cm/s LVOT diam:     2.00 cm      LV E/e' lateral: 12.2 LV SV:         68 LV SV Index:   25 LVOT Area:     3.14 cm  LV Volumes (MOD) LV vol d, MOD A2C: 157.0 ml LV vol d, MOD A4C: 160.0 ml LV vol s, MOD A2C: 56.8 ml LV vol s, MOD A4C: 57.1 ml LV SV MOD A2C:     100.2 ml LV SV MOD A4C:     160.0 ml LV SV MOD BP:      100.9 ml RIGHT VENTRICLE             IVC RV Basal diam:  4.60 cm     IVC diam: 2.30 cm RV Mid diam:    3.40 cm RV S prime:     11.60 cm/s TAPSE (M-mode): 1.8 cm LEFT ATRIUM             Index        RIGHT ATRIUM           Index LA diam:        4.20 cm 1.53 cm/m   RA Area:     21.60 cm LA Vol (A2C):   47.1 ml 17.18 ml/m  RA Volume:   68.40 ml  24.94 ml/m LA Vol (A4C):   31.2 ml 11.38 ml/m LA Biplane Vol: 42.1 ml 15.35 ml/m  AORTIC VALVE AV Area (Vmax):    1.34 cm AV Area (Vmean):   1.27 cm AV Area (VTI):     1.35 cm AV Vmax:           289.50 cm/s AV Vmean:          209.500 cm/s AV VTI:            0.506 m AV Peak Grad:      33.5 mmHg AV Mean Grad:      19.0 mmHg LVOT Vmax:         123.50 cm/s LVOT Vmean:        84.450 cm/s LVOT VTI:          0.217 m LVOT/AV VTI ratio: 0.43  AORTA Ao Asc diam: 3.50 cm MITRAL VALVE               TRICUSPID VALVE MV Area (PHT): 4.46 cm    TR Peak grad:   23.8 mmHg MV Decel Time: 170 msec    TR Vmax:        244.00 cm/s MV E  velocity: 81.10 cm/s MV A velocity: 87.40 cm/s  SHUNTS MV E/A ratio:  0.93        Systemic VTI:  0.22 m                            Systemic Diam: 2.00 cm Dorris Carnes MD Electronically signed by Dorris Carnes MD Signature Date/Time: 08/29/2021/4:14:15 PM    Final      Medical Consultants:   None.   Subjective:    CADELL HEYSER no complaints this morning.  Objective:    Vitals:   08/29/21 1644 08/29/21 2052 08/30/21 0625 08/30/21 0742  BP: 134/71 112/62 125/67 137/66  Pulse: 90 81 70 80  Resp: 15 18 19 16   Temp: 98.5 F (36.9  C) 98.4 F (36.9 C) 98.6 F (37 C) 98.6 F (37 C)  TempSrc: Oral   Oral  SpO2: 98% 97% 95% 98%  Weight:      Height:       SpO2: 98 % O2 Flow Rate (L/min): 0 L/min FiO2 (%): 21 %   Intake/Output Summary (Last 24 hours) at 08/30/2021 0928 Last data filed at 08/30/2021 0500 Gross per 24 hour  Intake 1180 ml  Output 1300 ml  Net -120 ml    Filed Weights   08/27/21 0853 08/28/21 1023  Weight: (!) 172.4 kg (!) 172.4 kg    Exam: General exam: In no acute distress. Respiratory system: Good air movement and clear to auscultation. Cardiovascular system: S1 & S2 heard, RRR. No JVD. Gastrointestinal system: Abdomen is nondistended, soft and nontender.  Extremities: Right below the knee amputation with wound VAC in place. Skin: No rashes, lesions or ulcers Psychiatry: Judgement and insight appear normal. Mood & affect appropriate.   Data Reviewed:    Labs: Basic Metabolic Panel: Recent Labs  Lab 08/27/21 0950 08/28/21 0545  NA 132* 134*  K 4.3 4.3  CL 97* 101  CO2 26 24  GLUCOSE 212* 157*  BUN 11 9  CREATININE 0.72 0.67  CALCIUM 8.7* 8.7*    GFR Estimated Creatinine Clearance: 186 mL/min (by C-G formula based on SCr of 0.67 mg/dL). Liver Function Tests: Recent Labs  Lab 08/27/21 0950  AST 20  ALT 26  ALKPHOS 76  BILITOT 0.7  PROT 7.4  ALBUMIN 2.7*    No results for input(s): LIPASE, AMYLASE in the last 168 hours. No  results for input(s): AMMONIA in the last 168 hours. Coagulation profile Recent Labs  Lab 08/28/21 0545 08/28/21 0801 08/28/21 1911 08/29/21 0036 08/30/21 0336  INR 2.6* 2.6* 2.3* 2.2* 1.4*    COVID-19 Labs  Recent Labs    08/28/21 0545 08/29/21 0036  CRP 18.5* 12.8*     Lab Results  Component Value Date   SARSCOV2NAA NEGATIVE 08/27/2021   [place] NEGATIVE 02/12/2021   [place] NEGATIVE 12/03/2019   [place] NEGATIVE 11/10/2019    CBC: Recent Labs  Lab 08/27/21 0950 08/28/21 0545 08/29/21 0036 08/30/21 0336  WBC 15.3* 11.3* 13.1* 13.4*  NEUTROABS 12.7*  --   --   --   HGB 10.7* 11.1* 10.4* 10.7*  HCT 33.1* 34.3* 31.9* 33.4*  MCV 86.9 84.3 83.3 84.3  PLT 266 282 290 322    Cardiac Enzymes: No results for input(s): CKTOTAL, CKMB, CKMBINDEX, TROPONINI in the last 168 hours. BNP (last 3 results) No results for input(s): PROBNP in the last 8760 hours. CBG: Recent Labs  Lab 08/29/21 0804 08/29/21 1111 08/29/21 1642 08/29/21 2049 08/30/21 0525  GLUCAP 127* 169* 155* 186* 155*    D-Dimer: No results for input(s): DDIMER in the last 72 hours. Hgb A1c: Recent Labs    08/28/21 0545  HGBA1C 7.8*    Lipid Profile: No results for input(s): CHOL, HDL, LDLCALC, TRIG, CHOLHDL, LDLDIRECT in the last 72 hours. Thyroid function studies: No results for input(s): TSH, T4TOTAL, T3FREE, THYROIDAB in the last 72 hours.  Invalid input(s): FREET3 Anemia work up: No results for input(s): VITAMINB12, FOLATE, FERRITIN, TIBC, IRON, RETICCTPCT in the last 72 hours. Sepsis Labs: Recent Labs  Lab 08/27/21 0950 08/28/21 0545 08/29/21 0036 08/30/21 0336  WBC 15.3* 11.3* 13.1* 13.4*    Microbiology Recent Results (from the past 240 hour(s))  Resp Panel by RT-PCR (Flu A&B, Covid) Nasopharyngeal Swab  Status: None   Collection Time: 08/27/21 10:03 AM   Specimen: Nasopharyngeal Swab; Nasopharyngeal(NP) swabs in vial transport medium  Result Value Ref  Range Status   SARS Coronavirus 2 by RT PCR NEGATIVE NEGATIVE Final    Comment: (NOTE) SARS-CoV-2 target nucleic acids are NOT DETECTED.  The SARS-CoV-2 RNA is generally detectable in upper respiratory specimens during the acute phase of infection. The lowest concentration of SARS-CoV-2 viral copies this assay can detect is 138 copies/mL. A negative result does not preclude SARS-Cov-2 infection and should not be used as the sole basis for treatment or other patient management decisions. A negative result may occur with  improper specimen collection/handling, submission of specimen other than nasopharyngeal swab, presence of viral mutation(s) within the areas targeted by this assay, and inadequate number of viral copies(<138 copies/mL). A negative result must be combined with clinical observations, patient history, and epidemiological information. The expected result is Negative.  Fact Sheet for Patients:  EntrepreneurPulse.com.au  Fact Sheet for Healthcare Providers:  IncredibleEmployment.be  This test is no t yet approved or cleared by the [place] FDA and  has been authorized for detection and/or diagnosis of SARS-CoV-2 by FDA under an Emergency Use Authorization (EUA). This EUA will remain  in effect (meaning this test can be used) for the duration of the COVID-19 declaration under Section 564(b)(1) of the Act, 21 U.S.C.section 360bbb-3(b)(1), unless the authorization is terminated  or revoked sooner.       Influenza A by PCR NEGATIVE NEGATIVE Final   Influenza B by PCR NEGATIVE NEGATIVE Final    Comment: (NOTE) The Xpert Xpress SARS-CoV-2/FLU/RSV plus assay is intended as an aid in the diagnosis of influenza from Nasopharyngeal swab specimens and should not be used as a sole basis for treatment. Nasal washings and aspirates are unacceptable for Xpert Xpress SARS-CoV-2/FLU/RSV testing.  Fact Sheet for  Patients: EntrepreneurPulse.com.au  Fact Sheet for Healthcare Providers: IncredibleEmployment.be  This test is not yet approved or cleared by the [place] FDA and has been authorized for detection and/or diagnosis of SARS-CoV-2 by FDA under an Emergency Use Authorization (EUA). This EUA will remain in effect (meaning this test can be used) for the duration of the COVID-19 declaration under Section 564(b)(1) of the Act, 21 U.S.C. section 360bbb-3(b)(1), unless the authorization is terminated or revoked.  Performed at [facility], [address], [place], [place] [address]   Culture, blood (routine x 2)     Status: None (Preliminary result)   Collection Time: 08/27/21 10:28 AM   Specimen: Left Antecubital; Blood  Result Value Ref Range Status   Specimen Description LEFT ANTECUBITAL  Final   Special Requests   Final    BOTTLES DRAWN AEROBIC AND ANAEROBIC Blood Culture adequate volume   Culture   Final    NO GROWTH 2 DAYS Performed at [facility], [address], [place], [place] [address]    Report Status PENDING  Incomplete  Culture, blood (routine x 2)     Status: Abnormal (Preliminary result)   Collection Time: 08/27/21 10:40 AM   Specimen: Right Antecubital; Blood  Result Value Ref Range Status   Specimen Description   Final    RIGHT ANTECUBITAL Performed at [facility], [address] [place],  [address]    Special Requests   Final    BOTTLES DRAWN AEROBIC AND ANAEROBIC Blood Culture results may not be optimal due to an excessive volume of blood received in culture bottles Performed at [facility], [address],  [place], [place] [address]    Culture  Setup Time   Final    GRAM POSITIVE COCCI IN BOTH AEROBIC AND ANAEROBIC BOTTLES Gram Stain Report Called to,Read Back By and Verified With: Coleman,V@ [place] [email or web address]  by Zigmund Daniel, b 12.2.22 CRITICAL RESULT CALLED TO, READ BACK BY AND VERIFIED WITH: PHARMD E MARTIN [id number] AT  1306 BY CM    Culture (A)  Final    STAPHYLOCOCCUS AUREUS CULTURE REINCUBATED FOR BETTER GROWTH Performed at [facility], [place] [address], [place], [place] [address]    Report Status PENDING  Incomplete   Organism ID, Bacteria STAPHYLOCOCCUS AUREUS  Final      Susceptibility   Staphylococcus aureus - MIC*    CIPROFLOXACIN <=0.5 SENSITIVE Sensitive     ERYTHROMYCIN <=0.25 SENSITIVE Sensitive     GENTAMICIN <=0.5 SENSITIVE Sensitive     OXACILLIN 0.5 SENSITIVE Sensitive     TETRACYCLINE <=1 SENSITIVE Sensitive     VANCOMYCIN 1 SENSITIVE Sensitive     TRIMETH/SULFA <=10 SENSITIVE Sensitive     CLINDAMYCIN <=0.25 SENSITIVE Sensitive     RIFAMPIN <=0.5 SENSITIVE Sensitive     Inducible Clindamycin NEGATIVE Sensitive     * STAPHYLOCOCCUS AUREUS  Blood Culture ID Panel (Reflexed)     Status: Abnormal   Collection Time: 08/27/21 10:40 AM  Result Value Ref Range Status   Enterococcus faecalis NOT DETECTED NOT DETECTED Final   Enterococcus Faecium NOT DETECTED NOT DETECTED Final   Listeria monocytogenes NOT DETECTED NOT DETECTED Final   Staphylococcus species DETECTED (A) NOT DETECTED Final    Comment: CRITICAL RESULT CALLED TO, READ BACK BY AND VERIFIED WITH: PHARMD E MARTIN [id number] AT 1306 BY CM    Staphylococcus aureus (BCID) DETECTED (A) NOT DETECTED Final    Comment: CRITICAL RESULT CALLED TO, READ BACK BY AND VERIFIED WITH: PHARMD E MARTIN [id number] AT 1306 BY CM    Staphylococcus epidermidis NOT DETECTED NOT DETECTED Final   Staphylococcus lugdunensis NOT DETECTED NOT DETECTED Final   Streptococcus species NOT DETECTED NOT DETECTED Final   Streptococcus agalactiae NOT DETECTED NOT DETECTED Final   Streptococcus pneumoniae NOT DETECTED NOT DETECTED Final   Streptococcus pyogenes NOT DETECTED NOT DETECTED Final   A.calcoaceticus-baumannii NOT DETECTED NOT DETECTED Final   Bacteroides fragilis NOT DETECTED NOT DETECTED Final   Enterobacterales NOT DETECTED NOT DETECTED Final    Enterobacter cloacae complex NOT DETECTED NOT DETECTED Final   Escherichia coli NOT DETECTED NOT DETECTED Final   Klebsiella aerogenes NOT DETECTED NOT DETECTED Final   Klebsiella oxytoca NOT DETECTED NOT DETECTED Final   Klebsiella pneumoniae NOT DETECTED NOT DETECTED Final   Proteus species NOT DETECTED NOT DETECTED Final   Salmonella species NOT DETECTED NOT DETECTED Final   Serratia marcescens NOT DETECTED NOT DETECTED Final   Haemophilus influenzae NOT DETECTED NOT DETECTED Final   Neisseria meningitidis NOT DETECTED NOT DETECTED Final   Pseudomonas aeruginosa NOT DETECTED NOT DETECTED Final   Stenotrophomonas maltophilia NOT DETECTED NOT DETECTED Final   Candida albicans NOT DETECTED NOT DETECTED Final   Candida auris NOT DETECTED NOT DETECTED Final   Candida glabrata NOT DETECTED NOT DETECTED Final   Candida krusei NOT DETECTED NOT DETECTED Final   Candida parapsilosis NOT DETECTED NOT DETECTED Final   Candida tropicalis NOT DETECTED NOT DETECTED Final   Cryptococcus neoformans/gattii NOT DETECTED NOT DETECTED Final   Meth resistant mecA/C and MREJ NOT DETECTED NOT DETECTED Final    Comment: Performed at [facility], [address], [place], [place] [address]  Surgical PCR screen     Status: Abnormal   Collection Time: 08/28/21  9:41 AM   Specimen: Nasal Mucosa; Nasal Swab  Result Value Ref Range Status   MRSA, PCR NEGATIVE NEGATIVE Final   Staphylococcus aureus POSITIVE (A) NEGATIVE Final    Comment: (NOTE) The Xpert SA Assay (FDA approved for NASAL specimens in patients 1 years of age and older), is one component of a comprehensive surveillance program. It is not intended to diagnose infection nor to guide or monitor treatment. Performed at [facility], [place] [address], [place], [place] [address]   Culture, blood (Routine X 2) w Reflex to ID Panel     Status: None (Preliminary result)   Collection Time: 08/28/21  3:31 PM   Specimen: BLOOD  Result Value  Ref Range Status   Specimen Description BLOOD RIGHT ARM  Final   Special Requests   Final    BOTTLES DRAWN AEROBIC AND ANAEROBIC Blood Culture adequate volume   Culture   Final    NO GROWTH 2 DAYS Performed at [facility], [place] [address] [place], [place] [address]    Report Status PENDING  Incomplete  Culture, blood (Routine X 2) w Reflex to ID Panel     Status: None (Preliminary result)   Collection Time: 08/28/21  3:34 PM   Specimen: BLOOD  Result Value Ref Range Status   Specimen Description BLOOD LEFT HAND  Final   Special Requests   Final    BOTTLES DRAWN AEROBIC ONLY Blood Culture adequate volume   Culture   Final    NO GROWTH 2 DAYS Performed at [facility], [place] [address], [place], [place] [address]    Report Status PENDING  Incomplete     Medications:    vitamin C  1,000 mg Oral Daily   atorvastatin  80 mg Oral Daily   docusate sodium  100 mg Oral Daily   enoxaparin (LOVENOX) injection  150 mg Subcutaneous Q12H   insulin aspart  0-15 Units Subcutaneous TID WC   insulin aspart  0-5 Units Subcutaneous QHS   insulin glargine-yfgn  15 Units Subcutaneous Daily   lisinopril  10 mg Oral Daily   mupirocin ointment  1 application Nasal BID   nutrition supplement (JUVEN)  1 packet Oral BID BM   pantoprazole  40 mg Oral Daily   sertraline  50 mg Oral Daily   warfarin  10 mg Oral q1600   zinc sulfate  220 mg Oral Daily   Continuous Infusions:  sodium chloride Stopped (08/29/21 0535)    ceFAZolin (ANCEF) IV 2 g (08/30/21 0558)   magnesium sulfate bolus IVPB        LOS: 3 days   Charlynne Cousins  [facility]  08/30/2021, 9:28 AM

## 2021-08-30 NOTE — Progress Notes (Signed)
ANTICOAGULATION CONSULT NOTE - Initial Consult  Pharmacy Consult for Coumadin and lovenox dosing Indication: VTE prophylaxis s/p BKA in a patient with a mechanical aortic valve replacement (12/05/2019) requiring anticoagulation  Allergies  Allergen Reactions   Pollen Extract Other (See Comments)    Runny nose, watery eyes, sneezing    Patient Measurements: Height: 5\' 10"  (177.8 cm) Weight: (!) 172.4 kg (380 lb 1.2 oz) IBW/kg (Calculated) : 73   Vital Signs: Temp: 98.6 F (37 C) (12/04 0742) Temp Source: Oral (12/04 0742) BP: 137/66 (12/04 0742) Pulse Rate: 80 (12/04 0742)  Labs: Recent Labs    08/27/21 0950 08/27/21 0952 08/28/21 0545 08/28/21 0801 08/28/21 1911 08/29/21 0036 08/30/21 0336  HGB 10.7*  --  11.1*  --   --  10.4* 10.7*  HCT 33.1*  --  34.3*  --   --  31.9* 33.4*  PLT 266  --  282  --   --  290 322  LABPROT  --    < > 27.7*   < > 24.9* 24.8* 17.1*  INR  --    < > 2.6*   < > 2.3* 2.2* 1.4*  CREATININE 0.72  --  0.67  --   --   --   --    < > = values in this interval not displayed.    Estimated Creatinine Clearance: 186 mL/min (by C-G formula based on SCr of 0.67 mg/dL).   Medical History: Past Medical History:  Diagnosis Date   Anxiety    Aortic stenosis    Cellulitis and abscess of lower extremity 06/11/2019   Cellulitis of fourth toe of left foot    Cholelithiasis    Coronary artery disease    Nonobstructive CAD (40-50% LAD) 08/2019   Depression    Depression    [employer] 09/27/2020   Depression    [employer] 11/01/2020   Diabetes mellitus without complication (HCC)    [employer] 09/27/2020   Elevated troponin level not due myocardial infarction 11/11/2019   Essential hypertension    Gangrene of toe of left foot (HCC) 07/06/2019   Heart murmur    [employer] 09/27/2020   Hyperlipidemia    [employer] 09/27/2020   Hypertension    [employer] 09/27/2020   Mixed hyperlipidemia    Morbid obesity (HCC)    S/P aortic valve replacement with mechanical  valve 12/05/2019   25 mm [employer] top hat bileaflet mechanical valve via partial upper hemi-sternotomy   Severe aortic stenosis 09/24/2019   Type 2 diabetes mellitus (HCC)     Medications:  Medications Prior to Admission  Medication Sig Dispense Refill Last Dose   atorvastatin (LIPITOR) 80 MG tablet Take 1 tablet (80 mg total) by mouth daily. 90 tablet 3 Past Week   insulin glargine (LANTUS SOLOSTAR) 100 UNIT/ML Solostar Pen Inject 30 Units into the skin at bedtime. 30 mL 3 Past Week   lisinopril (ZESTRIL) 20 MG tablet Take 1 tablet by mouth once daily 90 tablet 0 08/26/2021   metoprolol tartrate (LOPRESSOR) 25 MG tablet Take 1 tablet by mouth twice daily 180 tablet 0 08/26/2021 at 0800   sertraline (ZOLOFT) 50 MG tablet Take 1 tablet by mouth once daily 90 tablet 1 08/26/2021   sitaGLIPtin-metformin (JANUMET) 50-500 MG tablet Take 1 tablet by mouth 2 (two) times daily with a meal. 180 tablet 3 08/26/2021   warfarin (COUMADIN) 10 MG tablet Take 1 tablet daily. 45 tablet 5 08/25/2021 at 1800    Assessment:  Patient with a history of aortic  valve replacement on December 05, 2019 ([employer]omed[employer] Top Hat mechanical in position 1), need for continued anticoagulation s/p BKA with limited mobility d/t recent surgery and a subtherapeutic INR requiring overlapping anticoagulation until INR is therapeutic (2.0 - 3.0) given his co-morbidities.   INR: 1.4  Goal of Therapy:  INR 2-3 Anti-Xa level 0.6-1 units/ml 4hrs after LMWH dose given Monitor platelets by anticoagulation protocol: Yes   Plan:  Lovenox 150 mg sq q12h with coumadin 10 mg po qday (home dose) until INR is 2.0 or higher Monitor INR daily CBC every 72 hours   Alyviah Crandle BS, PharmD, BCPS Clinical Pharmacist 08/30/2021,8:10 AM

## 2021-08-30 NOTE — Progress Notes (Signed)
Inpatient Rehab Admissions Coordinator:  Pt gave permission to contact aunt, Bonita Quin. Spoke with Bonita Quin on the telephone. She acknowledged understanding of CIR goals and expectations. She is supportive of pt pursuing CIR. She confirmed that she could provide intermittent assistance/support for pt after discharge. Will continue to follow.   Wolfgang Phoenix, MS, CCC-SLP Admissions Coordinator [phone]

## 2021-08-30 NOTE — Progress Notes (Signed)
Mobility Specialist Progress Note   08/30/21 1728  Mobility  Activity Transferred:  Chair to bed;Transferred to/from [facility]  Level of Assistance Minimal assist, patient does 75% or more  Assistive Device Front wheel walker  LLE Weight Bearing NWB  Distance Ambulated (ft) 4 ft  Mobility Ambulated with assistance in room;Sit up in bed/chair position for meals  Mobility Response Tolerated well  Mobility performed by Mobility specialist  $Mobility charge 1 Mobility   Pt requesting assistance to get from [facility] to bed. No c/o symptoms prior to starting transfer. SPT to recliner, wheeled recliner to side of bed, SPT to bed. Min A STS for physical assistance. Left with tray in front,call bell beside and RN in room.   Post transfer symptoms: Slight pain in LLE    Frederico Hamman Mobility Specialist Phone Number [phone]

## 2021-08-31 DIAGNOSIS — E11628 Type 2 diabetes mellitus with other skin complications: Secondary | ICD-10-CM | POA: Diagnosis not present

## 2021-08-31 DIAGNOSIS — B9561 Methicillin susceptible Staphylococcus aureus infection as the cause of diseases classified elsewhere: Secondary | ICD-10-CM

## 2021-08-31 DIAGNOSIS — R7881 Bacteremia: Secondary | ICD-10-CM

## 2021-08-31 DIAGNOSIS — Z952 Presence of prosthetic heart valve: Secondary | ICD-10-CM | POA: Diagnosis not present

## 2021-08-31 LAB — CULTURE, BLOOD (ROUTINE X 2)

## 2021-08-31 LAB — HEPARIN ANTI-XA: Heparin LMW: 0.89 IU/mL

## 2021-08-31 LAB — PROTIME-INR
INR: 1.4 — ABNORMAL HIGH (ref 0.8–1.2)
Prothrombin Time: 17.3 seconds — ABNORMAL HIGH (ref 11.4–15.2)

## 2021-08-31 LAB — GLUCOSE, CAPILLARY
Glucose-Capillary: 151 mg/dL — ABNORMAL HIGH (ref 70–99)
Glucose-Capillary: 150 mg/dL — ABNORMAL HIGH (ref 70–99)
Glucose-Capillary: 128 mg/dL — ABNORMAL HIGH (ref 70–99)
Glucose-Capillary: 176 mg/dL — ABNORMAL HIGH (ref 70–99)

## 2021-08-31 LAB — CREATININE, SERUM
Creatinine, Ser: 0.64 mg/dL (ref 0.61–1.24)
GFR, Estimated: >60 mL/min (ref >60)

## 2021-08-31 LAB — SURGICAL PATHOLOGY

## 2021-08-31 MED ORDER — SODIUM CHLORIDE 0.9 % IV SOLN: INTRAVENOUS | Status: DC

## 2021-08-31 MED ORDER — WARFARIN SODIUM 6 MG PO TABS
12.0000 mg | ORAL_TABLET | Freq: Every day | ORAL | Status: DC
  Administered 2021-08-31: 12 mg via ORAL
  Filled 2021-08-31 (×2): qty 2

## 2021-08-31 MED ORDER — WARFARIN - PHARMACIST DOSING INPATIENT: Freq: Every day | Status: DC

## 2021-08-31 NOTE — Progress Notes (Signed)
[facility] PROGRESS NOTE    Progress Note  Adam Long  [id number] DOB: May 11, 1976 DOA: 08/27/2021 PCP: Noreene Larsson, NP     Brief Narrative:   Adam Long is an 45 y.o. male past medical history significant for diabetes mellitus type 2, mechanical aortic valve on warfarin, hypertension morbid obesity comes in with an infected diabetic foot.  Orthopedic surgery was consulted, who performed transtibial amputation with application of wound VAC.  Blood culture on admission was positive for MSSA cellulitis blood cultures were drawn. Infectious disease was consulted she was transitioned to IV cefazolin they recommended a TEE     Assessment/Plan:   Left Diabetic foot infection/MSSA bacteremia: Continue IV cefazolin.  BC ID was positive for MSSA. Surveillance blood cultures have been negative till date. ID on board and recommended at least 1 month of IV antibiotics.   They also recommended a TEE Physical therapy and Occupational Therapy evaluated the patient recommended inpatient rehab. Will likely need PICC line  Mechanical aortic valve: INR subtherapeutic, continue Lovenox and Coumadin INR is 1.4.   Uncontrolled diabetes mellitus type 2 with hyperglycemia: A1c is 7.8. Blood glucose fairly controlled continue current regimen.  We will start transitioning to his home regimen if his blood glucose start to increase.  Essential  HTN (hypertension) Well-controlled continue current regimen.  Hyperlipidemia: Continue statins.  Morbid obesity: With a BMI of 52 counseling.   DVT prophylaxis: Hepairn Family Communication:none Status is: Inpatient  Remains inpatient appropriate because: Diabetic foot infection requiring amputation        Code Status:     Code Status Orders  (From admission, onward)           Start     Ordered   08/28/21 0254  Full code  Continuous        08/28/21 0253           Code Status History     Date Active Date  Inactive Code Status Order ID Comments User Context   02/12/2021 2321 02/17/2021 1958 Full Code [id number]  Bernadette Hoit, DO Inpatient   12/05/2019 1444 12/12/2019 1546 Full Code [id number]  Elgie Collard, PA-C Inpatient   11/11/2019 0042 11/11/2019 1714 Full Code [id number]  Bryna Colander, MD Inpatient   09/24/2019 0821 09/24/2019 1538 Full Code [id number]  Sherren Mocha, MD Inpatient   07/06/2019 1835 07/10/2019 1432 Full Code [id number]  Elam Dutch, MD Inpatient   06/11/2019 1707 06/14/2019 1844 Full Code [id number]  Roxan Hockey, MD Inpatient   07/02/2013 1545 07/03/2013 1535 Full Code [id number]  Kinnie Feil, MD ED         IV Access:   Peripheral IV   Procedures and diagnostic studies:   ECHOCARDIOGRAM COMPLETE  Result Date: 08/29/2021    ECHOCARDIOGRAM REPORT   Patient Name:   Adam Long Date of Exam: 08/29/2021 Medical Rec #:  [id number]       Height:       70.0 in Accession #:    [id number]      Weight:       380.1 lb Date of Birth:  20-Jul-1976       BSA:          2.742 m Patient Age:    45 years        BP:           137/63 mmHg Patient Gender: M               HR:  95 bpm. Exam Location:  Inpatient Procedure: 2D Echo, Color Doppler, Cardiac Doppler and Intracardiac            Opacification Agent Indications:    Bacteremia  History:        Patient has prior history of Echocardiogram examinations. Risk                 Factors:Hypertension and Diabetes.                 Aortic Valve: 25 mm mechanical valve is present in the aortic                 position.  Sonographer:    Jyl Heinz Referring Phys: [place]  1. Left ventricular ejection fraction, by estimation, is 60 to 65%. The left ventricle has normal function. The left ventricle has no regional wall motion abnormalities. There is mild left ventricular hypertrophy. Left ventricular diastolic parameters were normal.  2. Right ventricular systolic function is normal. The right  ventricular size is normal.  3. Trivial mitral valve regurgitation. Moderate mitral annular calcification.  4. 25 mm [employer] Top Hat bileaflet mechanical prosthesis present Peak and mean gradients through the valve are 34 and 20 mm Hg respectively. COmpared to study from 2021, mean gradient is mildly increased. Leaflets not well seen . The aortic valve has been repaired/replaced. Aortic valve regurgitation is not visualized. There is a 25 mm mechanical valve present in the aortic position.  5. The inferior vena cava is normal in size with greater than 50% respiratory variability, suggesting right atrial pressure of 3 mmHg. FINDINGS  Left Ventricle: Left ventricular ejection fraction, by estimation, is 60 to 65%. The left ventricle has normal function. The left ventricle has no regional wall motion abnormalities. The left ventricular internal cavity size was normal in size. There is  mild left ventricular hypertrophy. Left ventricular diastolic parameters were normal. Right Ventricle: The right ventricular size is normal. Right vetricular wall thickness was not assessed. Right ventricular systolic function is normal. Left Atrium: Left atrial size was normal in size. Right Atrium: Right atrial size was normal in size. Pericardium: There is no evidence of pericardial effusion. Mitral Valve: There is mild thickening of the mitral valve leaflet(s). Moderate mitral annular calcification. Trivial mitral valve regurgitation. Tricuspid Valve: The tricuspid valve is grossly normal. Tricuspid valve regurgitation is trivial. Aortic Valve: 25 mm [employer] Top Hat bileaflet mechanical prosthesis present Peak and mean gradients through the valve are 34 and 20 mm Hg respectively. COmpared to study from 2021, mean gradient is mildly increased. Leaflets not well seen. The aortic  valve has been repaired/replaced. Aortic valve regurgitation is not visualized. Aortic valve mean gradient measures 19.0 mmHg. Aortic valve peak  gradient measures 33.5 mmHg. Aortic valve area, by VTI measures 1.35 cm. There is a 25 mm mechanical valve present in the aortic position. Pulmonic Valve: The pulmonic valve was normal in structure. Pulmonic valve regurgitation is not visualized. Aorta: The aortic root and ascending aorta are structurally normal, with no evidence of dilitation. Venous: The inferior vena cava is normal in size with greater than 50% respiratory variability, suggesting right atrial pressure of 3 mmHg. IAS/Shunts: No atrial level shunt detected by color flow Doppler.  LEFT VENTRICLE PLAX 2D LVIDd:         5.10 cm      Diastology LVIDs:         3.40 cm      LV e' medial:  6.64 cm/s LV PW:         1.40 cm      LV E/e' medial:  12.2 LV IVS:        1.20 cm      LV e' lateral:   6.64 cm/s LVOT diam:     2.00 cm      LV E/e' lateral: 12.2 LV SV:         68 LV SV Index:   25 LVOT Area:     3.14 cm  LV Volumes (MOD) LV vol d, MOD A2C: 157.0 ml LV vol d, MOD A4C: 160.0 ml LV vol s, MOD A2C: 56.8 ml LV vol s, MOD A4C: 57.1 ml LV SV MOD A2C:     100.2 ml LV SV MOD A4C:     160.0 ml LV SV MOD BP:      100.9 ml RIGHT VENTRICLE             IVC RV Basal diam:  4.60 cm     IVC diam: 2.30 cm RV Mid diam:    3.40 cm RV S prime:     11.60 cm/s TAPSE (M-mode): 1.8 cm LEFT ATRIUM             Index        RIGHT ATRIUM           Index LA diam:        4.20 cm 1.53 cm/m   RA Area:     21.60 cm LA Vol (A2C):   47.1 ml 17.18 ml/m  RA Volume:   68.40 ml  24.94 ml/m LA Vol (A4C):   31.2 ml 11.38 ml/m LA Biplane Vol: 42.1 ml 15.35 ml/m  AORTIC VALVE AV Area (Vmax):    1.34 cm AV Area (Vmean):   1.27 cm AV Area (VTI):     1.35 cm AV Vmax:           289.50 cm/s AV Vmean:          209.500 cm/s AV VTI:            0.506 m AV Peak Grad:      33.5 mmHg AV Mean Grad:      19.0 mmHg LVOT Vmax:         123.50 cm/s LVOT Vmean:        84.450 cm/s LVOT VTI:          0.217 m LVOT/AV VTI ratio: 0.43  AORTA Ao Asc diam: 3.50 cm MITRAL VALVE               TRICUSPID  VALVE MV Area (PHT): 4.46 cm    TR Peak grad:   23.8 mmHg MV Decel Time: 170 msec    TR Vmax:        244.00 cm/s MV E velocity: 81.10 cm/s MV A velocity: 87.40 cm/s  SHUNTS MV E/A ratio:  0.93        Systemic VTI:  0.22 m                            Systemic Diam: 2.00 cm Dietrich Pates MD Electronically signed by Dietrich Pates MD Signature Date/Time: 08/29/2021/4:14:15 PM    Final      Medical Consultants:   None.   Subjective:    Adam Long no complaints was having a bowel movement this morning.  Objective:    Vitals:   08/30/21 1038 08/30/21  1445 08/30/21 2050 08/31/21 0833  BP: 127/81 133/77 111/68 112/66  Pulse: 91 84 84 91  Resp:  16 19 18   Temp:  98.6 F (37 C) 98.2 F (36.8 C) 98.4 F (36.9 C)  TempSrc:  Oral Oral Oral  SpO2:  96% 97% 94%  Weight:      Height:       SpO2: 94 % O2 Flow Rate (L/min): 0 L/min FiO2 (%): 21 %   Intake/Output Summary (Last 24 hours) at 08/31/2021 1015 Last data filed at 08/31/2021 0329 Gross per 24 hour  Intake 720 ml  Output 2350 ml  Net -1630 ml    Filed Weights   08/27/21 0853 08/28/21 1023  Weight: (!) 172.4 kg (!) 172.4 kg    Exam: General exam: In no acute distress. Respiratory system: Good air movement and clear to auscultation. Cardiovascular system: S1 & S2 heard, RRR. No JVD. Gastrointestinal system: Abdomen is nondistended, soft and nontender.  Psychiatry: Judgement and insight appear normal. Mood & affect appropriate. Extremities: Right below the knee amputation with wound VAC in place.    Data Reviewed:    Labs: Basic Metabolic Panel: Recent Labs  Lab 08/27/21 0950 08/28/21 0545 08/31/21 0446  NA 132* 134*  --   K 4.3 4.3  --   CL 97* 101  --   CO2 26 24  --   GLUCOSE 212* 157*  --   BUN 11 9  --   CREATININE 0.72 0.67 0.64  CALCIUM 8.7* 8.7*  --     GFR Estimated Creatinine Clearance: 186 mL/min (by C-G formula based on SCr of 0.64 mg/dL). Liver Function Tests: Recent Labs  Lab  08/27/21 0950  AST 20  ALT 26  ALKPHOS 76  BILITOT 0.7  PROT 7.4  ALBUMIN 2.7*    No results for input(s): LIPASE, AMYLASE in the last 168 hours. No results for input(s): AMMONIA in the last 168 hours. Coagulation profile Recent Labs  Lab 08/28/21 0801 08/28/21 1911 08/29/21 0036 08/30/21 0336 08/31/21 0446  INR 2.6* 2.3* 2.2* 1.4* 1.4*    COVID-19 Labs  Recent Labs    08/29/21 0036  CRP 12.8*     Lab Results  Component Value Date   SARSCOV2NAA NEGATIVE 08/27/2021   [place] NEGATIVE 02/12/2021   [place] NEGATIVE 12/03/2019   [place] NEGATIVE 11/10/2019    CBC: Recent Labs  Lab 08/27/21 0950 08/28/21 0545 08/29/21 0036 08/30/21 0336 08/30/21 0945  WBC 15.3* 11.3* 13.1* 13.4* 12.3*  NEUTROABS 12.7*  --   --   --   --   HGB 10.7* 11.1* 10.4* 10.7* 11.2*  HCT 33.1* 34.3* 31.9* 33.4* 33.8*  MCV 86.9 84.3 83.3 84.3 83.3  PLT 266 282 290 322 371    Cardiac Enzymes: No results for input(s): CKTOTAL, CKMB, CKMBINDEX, TROPONINI in the last 168 hours. BNP (last 3 results) No results for input(s): PROBNP in the last 8760 hours. CBG: Recent Labs  Lab 08/30/21 0525 08/30/21 1150 08/30/21 1600 08/30/21 2123 08/31/21 0811  GLUCAP 155* 146* 173* 141* 151*    D-Dimer: No results for input(s): DDIMER in the last 72 hours. Hgb A1c: No results for input(s): HGBA1C in the last 72 hours.  Lipid Profile: No results for input(s): CHOL, HDL, LDLCALC, TRIG, CHOLHDL, LDLDIRECT in the last 72 hours. Thyroid function studies: No results for input(s): TSH, T4TOTAL, T3FREE, THYROIDAB in the last 72 hours.  Invalid input(s): FREET3 Anemia work up: No results for input(s): VITAMINB12, FOLATE, FERRITIN, TIBC, IRON, RETICCTPCT  in the last 72 hours. Sepsis Labs: Recent Labs  Lab 08/28/21 0545 08/29/21 0036 08/30/21 0336 08/30/21 0945  WBC 11.3* 13.1* 13.4* 12.3*    Microbiology Recent Results (from the past 240 hour(s))  Resp Panel by RT-PCR (Flu  A&B, Covid) Nasopharyngeal Swab     Status: None   Collection Time: 08/27/21 10:03 AM   Specimen: Nasopharyngeal Swab; Nasopharyngeal(NP) swabs in vial transport medium  Result Value Ref Range Status   SARS Coronavirus 2 by RT PCR NEGATIVE NEGATIVE Final    Comment: (NOTE) SARS-CoV-2 target nucleic acids are NOT DETECTED.  The SARS-CoV-2 RNA is generally detectable in upper respiratory specimens during the acute phase of infection. The lowest concentration of SARS-CoV-2 viral copies this assay can detect is 138 copies/mL. A negative result does not preclude SARS-Cov-2 infection and should not be used as the sole basis for treatment or other patient management decisions. A negative result may occur with  improper specimen collection/handling, submission of specimen other than nasopharyngeal swab, presence of viral mutation(s) within the areas targeted by this assay, and inadequate number of viral copies(<138 copies/mL). A negative result must be combined with clinical observations, patient history, and epidemiological information. The expected result is Negative.  Fact Sheet for Patients:  EntrepreneurPulse.com.au  Fact Sheet for Healthcare Providers:  IncredibleEmployment.be  This test is no t yet approved or cleared by the [place] FDA and  has been authorized for detection and/or diagnosis of SARS-CoV-2 by FDA under an Emergency Use Authorization (EUA). This EUA will remain  in effect (meaning this test can be used) for the duration of the COVID-19 declaration under Section 564(b)(1) of the Act, 21 U.S.C.section 360bbb-3(b)(1), unless the authorization is terminated  or revoked sooner.       Influenza A by PCR NEGATIVE NEGATIVE Final   Influenza B by PCR NEGATIVE NEGATIVE Final    Comment: (NOTE) The Xpert Xpress SARS-CoV-2/FLU/RSV plus assay is intended as an aid in the diagnosis of influenza from Nasopharyngeal swab specimens  and should not be used as a sole basis for treatment. Nasal washings and aspirates are unacceptable for Xpert Xpress SARS-CoV-2/FLU/RSV testing.  Fact Sheet for Patients: EntrepreneurPulse.com.au  Fact Sheet for Healthcare Providers: IncredibleEmployment.be  This test is not yet approved or cleared by the [place] FDA and has been authorized for detection and/or diagnosis of SARS-CoV-2 by FDA under an Emergency Use Authorization (EUA). This EUA will remain in effect (meaning this test can be used) for the duration of the COVID-19 declaration under Section 564(b)(1) of the Act, 21 U.S.C. section 360bbb-3(b)(1), unless the authorization is terminated or revoked.  Performed at [facility], [address] [place], [place] [address]   Culture, blood (routine x 2)     Status: None (Preliminary result)   Collection Time: 08/27/21 10:28 AM   Specimen: Left Antecubital; Blood  Result Value Ref Range Status   Specimen Description LEFT ANTECUBITAL  Final   Special Requests   Final    BOTTLES DRAWN AEROBIC AND ANAEROBIC Blood Culture adequate volume   Culture   Final    NO GROWTH 4 DAYS Performed at [facility], [address] [place],  [address]    Report Status PENDING  Incomplete  Culture, blood (routine x 2)     Status: Abnormal   Collection Time: 08/27/21 10:40 AM   Specimen: Right Antecubital; Blood  Result Value Ref Range Status   Specimen Description   Final    RIGHT ANTECUBITAL Performed at [facility],  [address] [place], [place] [address]    Special Requests   Final    BOTTLES DRAWN AEROBIC AND ANAEROBIC Blood Culture results may not be optimal due to an excessive volume of blood received in culture bottles Performed at [facility], [address] [place], [place] [address]    Culture  Setup Time   Final    GRAM POSITIVE COCCI IN BOTH AEROBIC AND ANAEROBIC BOTTLES Gram Stain Report Called to,Read Back By and Verified  With: Coleman,[email or web address]  by Zigmund Daniel, b 12.2.22 CRITICAL RESULT CALLED TO, READ BACK BY AND VERIFIED WITH: PHARMD E MARTIN [id number] AT 1306 BY CM Performed at [facility], [place] [address], [place], [place] [address]    Culture STAPHYLOCOCCUS AUREUS STREPTOCOCCUS AGALACTIAE  (A)  Final   Report Status 08/31/2021 FINAL  Final   Organism ID, Bacteria STAPHYLOCOCCUS AUREUS  Final   Organism ID, Bacteria STREPTOCOCCUS AGALACTIAE  Final      Susceptibility   Streptococcus agalactiae - MIC*    CLINDAMYCIN <=0.25 SENSITIVE Sensitive     AMPICILLIN <=0.25 SENSITIVE Sensitive     ERYTHROMYCIN <=0.12 SENSITIVE Sensitive     VANCOMYCIN 0.5 SENSITIVE Sensitive     CEFTRIAXONE <=0.12 SENSITIVE Sensitive     LEVOFLOXACIN 1 SENSITIVE Sensitive     * STREPTOCOCCUS AGALACTIAE   Staphylococcus aureus - MIC*    CIPROFLOXACIN <=0.5 SENSITIVE Sensitive     ERYTHROMYCIN <=0.25 SENSITIVE Sensitive     GENTAMICIN <=0.5 SENSITIVE Sensitive     OXACILLIN 0.5 SENSITIVE Sensitive     TETRACYCLINE <=1 SENSITIVE Sensitive     VANCOMYCIN 1 SENSITIVE Sensitive     TRIMETH/SULFA <=10 SENSITIVE Sensitive     CLINDAMYCIN <=0.25 SENSITIVE Sensitive     RIFAMPIN <=0.5 SENSITIVE Sensitive     Inducible Clindamycin NEGATIVE Sensitive     * STAPHYLOCOCCUS AUREUS  Blood Culture ID Panel (Reflexed)     Status: Abnormal   Collection Time: 08/27/21 10:40 AM  Result Value Ref Range Status   Enterococcus faecalis NOT DETECTED NOT DETECTED Final   Enterococcus Faecium NOT DETECTED NOT DETECTED Final   Listeria monocytogenes NOT DETECTED NOT DETECTED Final   Staphylococcus species DETECTED (A) NOT DETECTED Final    Comment: CRITICAL RESULT CALLED TO, READ BACK BY AND VERIFIED WITH: PHARMD E MARTIN [id number] AT 1306 BY CM    Staphylococcus aureus (BCID) DETECTED (A) NOT DETECTED Final    Comment: CRITICAL RESULT CALLED TO, READ BACK BY AND VERIFIED WITH: PHARMD E MARTIN [id number] AT 1306 BY CM    Staphylococcus  epidermidis NOT DETECTED NOT DETECTED Final   Staphylococcus lugdunensis NOT DETECTED NOT DETECTED Final   Streptococcus species NOT DETECTED NOT DETECTED Final   Streptococcus agalactiae NOT DETECTED NOT DETECTED Final   Streptococcus pneumoniae NOT DETECTED NOT DETECTED Final   Streptococcus pyogenes NOT DETECTED NOT DETECTED Final   A.calcoaceticus-baumannii NOT DETECTED NOT DETECTED Final   Bacteroides fragilis NOT DETECTED NOT DETECTED Final   Enterobacterales NOT DETECTED NOT DETECTED Final   Enterobacter cloacae complex NOT DETECTED NOT DETECTED Final   Escherichia coli NOT DETECTED NOT DETECTED Final   Klebsiella aerogenes NOT DETECTED NOT DETECTED Final   Klebsiella oxytoca NOT DETECTED NOT DETECTED Final   Klebsiella pneumoniae NOT DETECTED NOT DETECTED Final   Proteus species NOT DETECTED NOT DETECTED Final   Salmonella species NOT DETECTED NOT DETECTED Final   Serratia marcescens NOT DETECTED NOT DETECTED Final   Haemophilus influenzae NOT DETECTED NOT DETECTED Final  Neisseria meningitidis NOT DETECTED NOT DETECTED Final   Pseudomonas aeruginosa NOT DETECTED NOT DETECTED Final   Stenotrophomonas maltophilia NOT DETECTED NOT DETECTED Final   Candida albicans NOT DETECTED NOT DETECTED Final   Candida auris NOT DETECTED NOT DETECTED Final   Candida glabrata NOT DETECTED NOT DETECTED Final   Candida krusei NOT DETECTED NOT DETECTED Final   Candida parapsilosis NOT DETECTED NOT DETECTED Final   Candida tropicalis NOT DETECTED NOT DETECTED Final   Cryptococcus neoformans/gattii NOT DETECTED NOT DETECTED Final   Meth resistant mecA/C and MREJ NOT DETECTED NOT DETECTED Final    Comment: Performed at [facility], [place] [address] [place], [place] [address]  Surgical PCR screen     Status: Abnormal   Collection Time: 08/28/21  9:41 AM   Specimen: Nasal Mucosa; Nasal Swab  Result Value Ref Range Status   MRSA, PCR NEGATIVE NEGATIVE Final   Staphylococcus aureus POSITIVE  (A) NEGATIVE Final    Comment: (NOTE) The Xpert SA Assay (FDA approved for NASAL specimens in patients 48 years of age and older), is one component of a comprehensive surveillance program. It is not intended to diagnose infection nor to guide or monitor treatment. Performed at [facility], [place] [address] [place], [place] [address]   Culture, blood (Routine X 2) w Reflex to ID Panel     Status: None (Preliminary result)   Collection Time: 08/28/21  3:31 PM   Specimen: BLOOD  Result Value Ref Range Status   Specimen Description BLOOD RIGHT ARM  Final   Special Requests   Final    BOTTLES DRAWN AEROBIC AND ANAEROBIC Blood Culture adequate volume   Culture   Final    NO GROWTH 3 DAYS Performed at [facility], [place] [address], [place], [place] [address]    Report Status PENDING  Incomplete  Culture, blood (Routine X 2) w Reflex to ID Panel     Status: None (Preliminary result)   Collection Time: 08/28/21  3:34 PM   Specimen: BLOOD  Result Value Ref Range Status   Specimen Description BLOOD LEFT HAND  Final   Special Requests   Final    BOTTLES DRAWN AEROBIC ONLY Blood Culture adequate volume   Culture   Final    NO GROWTH 3 DAYS Performed at [facility], [place] [address], [place], [place] [address]    Report Status PENDING  Incomplete     Medications:    vitamin C  1,000 mg Oral Daily   atorvastatin  80 mg Oral Daily   docusate sodium  100 mg Oral Daily   enoxaparin (LOVENOX) injection  150 mg Subcutaneous Q12H   insulin aspart  0-15 Units Subcutaneous TID WC   insulin aspart  0-5 Units Subcutaneous QHS   insulin glargine-yfgn  15 Units Subcutaneous Daily   lisinopril  10 mg Oral Daily   metoprolol tartrate  25 mg Oral BID   mupirocin ointment  1 application Nasal BID   nutrition supplement (JUVEN)  1 packet Oral BID BM   pantoprazole  40 mg Oral Daily   sertraline  50 mg Oral Daily   warfarin  10 mg Oral q1600   zinc sulfate  220 mg Oral Daily    Continuous Infusions:  sodium chloride Stopped (08/29/21 0535)    ceFAZolin (ANCEF) IV 2 g (08/31/21 0326)   magnesium sulfate bolus IVPB        LOS: 4 days   Adam Long  [facility]  08/31/2021, 10:15 AM

## 2021-08-31 NOTE — Progress Notes (Signed)
Physical Therapy Treatment Patient Details Name: Adam Long MRN: [id number] DOB: Feb 27, 1976 Today's Date: 08/31/2021   History of Present Illness Pt is a 45 y.o. male admitted 12/1 with L foot infection. He underwent L BKA 12/2. PMH: diabetes mellitus type 2, mechanical aortic valve replacement on warfarin, coronary disease, hypertension, hyperlipidemia, morbid obesity    PT Comments    Patient progressing well towards PT goals. Session focused on progressive ambulation and functional transfers. Pt reports pain is well controlled. Highly motivated to return to independence and get to rehab. Instructed pt in there ex of left residual limb. Continues to demonstrate fatigue and decreased endurance. Will continue to follow and progress.     Recommendations for follow up therapy are one component of a multi-disciplinary discharge planning process, led by the attending physician.  Recommendations may be updated based on patient status, additional functional criteria and insurance authorization.  Follow Up Recommendations  Acute inpatient rehab (3hours/day)     Assistance Recommended at Discharge Frequent or constant Supervision/Assistance  Equipment Recommendations  Wheelchair (measurements PT);BSC/3in1    Recommendations for Other Services       Precautions / Restrictions Precautions Precautions: Fall;Other (comment) Precaution Comments: wound vac Required Braces or Orthoses: Other Brace Other Brace: limb protector Restrictions Weight Bearing Restrictions: Yes LLE Weight Bearing: Non weight bearing Other Position/Activity Restrictions: L limb protector     Mobility  Bed Mobility Overal bed mobility: Needs Assistance Bed Mobility: Sit to Supine       Sit to supine: Supervision   General bed mobility comments: HOB flat, use of rail to get into bed.    Transfers Overall transfer level: Needs assistance Equipment used: Rolling walker (2 wheels) Transfers: Sit to/from  Stand;Bed to chair/wheelchair/BSC Sit to Stand: Min assist     Step pivot transfers: Min guard     General transfer comment: Assist to power to standing/steady esp in mid range, good demo of hand placement. Stood from chair x3, SPT chair to bed Min guard assist.    Ambulation/Gait Ambulation/Gait assistance: Min guard;Min assist;+2 safety/equipment Gait Distance (Feet): 5 Feet (+6') Assistive device: Rolling walker (2 wheels)   Gait velocity: decreased     General Gait Details: Hop to gait with short hops and heavy reliance on UEs. 1 seated rest break due to fatigue.   Stairs             Wheelchair Mobility    Modified Rankin (Stroke Patients Only)       Balance Overall balance assessment: Needs assistance Sitting-balance support: Feet supported;No upper extremity supported Sitting balance-Leahy Scale: Good     Standing balance support: During functional activity;Reliant on assistive device for balance Standing balance-Leahy Scale: Poor Standing balance comment: reliant on B UE support                            Cognition Arousal/Alertness: Awake/alert Behavior During Therapy: WFL for tasks assessed/performed Overall Cognitive Status: Within Functional Limits for tasks assessed                                          Exercises Amputee Exercises Knee Flexion: AROM;Left;10 reps;Seated Knee Extension: AROM;Left;10 reps;Seated    General Comments        Pertinent Vitals/Pain Pain Assessment: Faces Faces Pain Scale: Hurts a little bit Pain Location: L residual limb Pain Descriptors /  Indicators: Discomfort;Sore Pain Intervention(s): Monitored during session    Home Living                          Prior Function            PT Goals (current goals can now be found in the care plan section) Progress towards PT goals: Progressing toward goals    Frequency    Min 3X/week      PT Plan Current plan  remains appropriate    Co-evaluation              AM-PAC PT "6 Clicks" Mobility   Outcome Measure  Help needed turning from your back to your side while in a flat bed without using bedrails?: A Little Help needed moving from lying on your back to sitting on the side of a flat bed without using bedrails?: A Little Help needed moving to and from a bed to a chair (including a wheelchair)?: A Little Help needed standing up from a chair using your arms (e.g., wheelchair or bedside chair)?: A Little Help needed to walk in hospital room?: A Little Help needed climbing 3-5 steps with a railing? : Total 6 Click Score: 16    End of Session Equipment Utilized During Treatment: Gait belt Activity Tolerance: Patient tolerated treatment well Patient left: in bed;with call bell/phone within reach;with bed alarm set Nurse Communication: Mobility status PT Visit Diagnosis: Other abnormalities of gait and mobility (R26.89);Pain Pain - Right/Left: Left Pain - part of body: Leg     Time: 1424-1450 PT Time Calculation (min) (ACUTE ONLY): 26 min  Charges:  $Gait Training: 8-22 mins $Therapeutic Activity: 8-22 mins                     Vale Haven, PT, DPT Acute Rehabilitation Services Pager [phone] Office [phone]      Blake Divine A Lanier Ensign 08/31/2021, 3:48 PM

## 2021-08-31 NOTE — Progress Notes (Signed)
    [facility] has been requested to perform a transesophageal echocardiogram on Adam Long for Bacteremia.  After careful review of history and examination, the risks and benefits of transesophageal echocardiogram have been explained including risks of esophageal damage, perforation (1:10,000 risk), bleeding, pharyngeal hematoma as well as other potential complications associated with conscious sedation including aspiration, arrhythmia, respiratory failure and death. Alternatives to treatment were discussed, questions were answered. Patient is willing to proceed.  TEE - Dr. Bjorn Pippin @ 1330. NPO after midnight. Meds with sips.   Manson Passey, PA-C 08/31/2021 2:26 PM

## 2021-08-31 NOTE — Progress Notes (Signed)
Patient ID: Adam Long, male   DOB: 08/17/1976, 45 y.o.   MRN: [id number] Patient is a 45 year old gentleman who is postoperative day 3 transtibial amputation.  There is no drainage in the wound VAC canister there is a good suction fit.  Patient is demonstrating good effort with physical therapy.  Anticipate that inpatient rehab may be a good option for him.

## 2021-08-31 NOTE — Progress Notes (Signed)
Inpatient Rehab Admissions Coordinator:  °Insurance authorization received. Awaiting medical clearance and bed availability. Will continue to follow. ° °Tora Prunty Graves Madden, MS, CCC-SLP °Admissions Coordinator °[phone] ° °

## 2021-08-31 NOTE — Progress Notes (Signed)
[facility] for Infectious Disease  Date of Admission:  08/27/2021           Reason for visit: Follow up on MSSA bacteremia  Current antibiotics: Cefazolin 08/28/21--present  ASSESSMENT:    45 y.o. male admitted with:  MSSA and GBS bacteremia: secondary to diabetic foot infection s/p BKA 08/28/21.  Repeat cultures from 08/28/21 NGTD as well. Mechanical aortic valve: on chronic warfarin Obesity DM type 2: A1c is 7.8 Sepsis: due to #1  RECOMMENDATIONS:    Continue cefazolin Follow up repeat cultures TEE (Cards called) Wound care, glycemic control Will follow   Principal Problem:   Diabetic foot infection ([place]) Active Problems:   HTN (hypertension)   Morbid obesity ([facility])   S/P AVR (aortic valve replacement)   Severe protein-calorie malnutrition ([facility])   MSSA bacteremia    MEDICATIONS:    Scheduled Meds: . vitamin C  1,000 mg Oral Daily  . atorvastatin  80 mg Oral Daily  . docusate sodium  100 mg Oral Daily  . enoxaparin (LOVENOX) injection  150 mg Subcutaneous Q12H  . insulin aspart  0-15 Units Subcutaneous TID WC  . insulin aspart  0-5 Units Subcutaneous QHS  . insulin glargine-yfgn  15 Units Subcutaneous Daily  . lisinopril  10 mg Oral Daily  . metoprolol tartrate  25 mg Oral BID  . mupirocin ointment  1 application Nasal BID  . nutrition supplement (JUVEN)  1 packet Oral BID BM  . pantoprazole  40 mg Oral Daily  . sertraline  50 mg Oral Daily  . warfarin  10 mg Oral q1600  . zinc sulfate  220 mg Oral Daily   Continuous Infusions: . sodium chloride Stopped (08/29/21 0535)  .  ceFAZolin (ANCEF) IV 2 g (08/31/21 0326)  . magnesium sulfate bolus IVPB     PRN Meds:.[DISCONTINUED] acetaminophen **OR** acetaminophen, acetaminophen, alum & mag hydroxide-simeth, bisacodyl, guaiFENesin-dextromethorphan, hydrALAZINE, HYDROmorphone (DILAUDID) injection, labetalol, magnesium citrate, magnesium sulfate bolus IVPB, metoprolol tartrate, ondansetron **OR**  ondansetron (ZOFRAN) IV, oxyCODONE, oxyCODONE, phenol, polyethylene glycol, potassium chloride  SUBJECTIVE:   24 hour events:  No acute events  No fevers, pain adequately controlled.  No n/v/d.  Tolerating abx and agreeable to TEE.  Review of Systems  All other systems reviewed and are negative.    OBJECTIVE:   Blood pressure 112/66, pulse 91, temperature 98.4 F (36.9 C), temperature source Oral, resp. rate 18, height 5\' 10"  (1.778 m), weight (!) 172.4 kg, SpO2 94 %. Body mass index is 54.53 kg/m.  Physical Exam Constitutional:      General: He is not in acute distress.    Appearance: Normal appearance. He is obese.  HENT:     Head: Normocephalic and atraumatic.  Eyes:     Extraocular Movements: Extraocular movements intact.     Conjunctiva/sclera: Conjunctivae normal.  Pulmonary:     Effort: Pulmonary effort is normal. No respiratory distress.  Abdominal:     General: Bowel sounds are normal.     Palpations: Abdomen is soft.  Musculoskeletal:        General: Normal range of motion.     Comments: S/p BKA  Skin:    General: Skin is warm and dry.     Findings: No rash.  Neurological:     General: No focal deficit present.     Mental Status: He is alert and oriented to person, place, and time.  Psychiatric:        Mood and Affect: Mood normal.  Behavior: Behavior normal.     Lab Results: Lab Results  Component Value Date   WBC 12.3 (H) 08/30/2021   HGB 11.2 (L) 08/30/2021   HCT 33.8 (L) 08/30/2021   MCV 83.3 08/30/2021   PLT 371 08/30/2021    Lab Results  Component Value Date   NA 134 (L) 08/28/2021   K 4.3 08/28/2021   CO2 24 08/28/2021   GLUCOSE 157 (H) 08/28/2021   BUN 9 08/28/2021   CREATININE 0.64 08/31/2021   CALCIUM 8.7 (L) 08/28/2021   GFRNONAA >60 08/31/2021   GFRAA 126 10/31/2020    Lab Results  Component Value Date   ALT 26 08/27/2021   AST 20 08/27/2021   ALKPHOS 76 08/27/2021   BILITOT 0.7 08/27/2021       Component Value  Date/Time   CRP 12.8 (H) 08/29/2021 0036       Component Value Date/Time   ESRSEDRATE 104 (H) 08/29/2021 0036     I have reviewed the micro and lab results in Epic.  Imaging: ECHOCARDIOGRAM COMPLETE  Result Date: 08/29/2021    ECHOCARDIOGRAM REPORT   Patient Name:   Adam Long Date of Exam: 08/29/2021 Medical Rec #:  [id number]       Height:       70.0 in Accession #:    [id number]      Weight:       380.1 lb Date of Birth:  08-11-1976       BSA:          2.742 m Patient Age:    44 years        BP:           137/63 mmHg Patient Gender: M               HR:           95 bpm. Exam Location:  Inpatient Procedure: 2D Echo, Color Doppler, Cardiac Doppler and Intracardiac            Opacification Agent Indications:    Bacteremia  History:        Patient has prior history of Echocardiogram examinations. Risk                 Factors:Hypertension and Diabetes.                 Aortic Valve: 25 mm mechanical valve is present in the aortic                 position.  Sonographer:    Jyl Heinz Referring Phys: [place]  1. Left ventricular ejection fraction, by estimation, is 60 to 65%. The left ventricle has normal function. The left ventricle has no regional wall motion abnormalities. There is mild left ventricular hypertrophy. Left ventricular diastolic parameters were normal.  2. Right ventricular systolic function is normal. The right ventricular size is normal.  3. Trivial mitral valve regurgitation. Moderate mitral annular calcification.  4. 25 mm [employer] Top Hat bileaflet mechanical prosthesis present Peak and mean gradients through the valve are 34 and 20 mm Hg respectively. COmpared to study from 2021, mean gradient is mildly increased. Leaflets not well seen . The aortic valve has been repaired/replaced. Aortic valve regurgitation is not visualized. There is a 25 mm mechanical valve present in the aortic position.  5. The inferior vena cava is normal in size with  greater than 50% respiratory variability, suggesting right atrial pressure of 3 mmHg. FINDINGS  Left Ventricle: Left ventricular ejection fraction, by estimation, is 60 to 65%. The left ventricle has normal function. The left ventricle has no regional wall motion abnormalities. The left ventricular internal cavity size was normal in size. There is  mild left ventricular hypertrophy. Left ventricular diastolic parameters were normal. Right Ventricle: The right ventricular size is normal. Right vetricular wall thickness was not assessed. Right ventricular systolic function is normal. Left Atrium: Left atrial size was normal in size. Right Atrium: Right atrial size was normal in size. Pericardium: There is no evidence of pericardial effusion. Mitral Valve: There is mild thickening of the mitral valve leaflet(s). Moderate mitral annular calcification. Trivial mitral valve regurgitation. Tricuspid Valve: The tricuspid valve is grossly normal. Tricuspid valve regurgitation is trivial. Aortic Valve: 25 mm [employer] Top Hat bileaflet mechanical prosthesis present Peak and mean gradients through the valve are 34 and 20 mm Hg respectively. COmpared to study from 2021, mean gradient is mildly increased. Leaflets not well seen. The aortic  valve has been repaired/replaced. Aortic valve regurgitation is not visualized. Aortic valve mean gradient measures 19.0 mmHg. Aortic valve peak gradient measures 33.5 mmHg. Aortic valve area, by VTI measures 1.35 cm. There is a 25 mm mechanical valve present in the aortic position. Pulmonic Valve: The pulmonic valve was normal in structure. Pulmonic valve regurgitation is not visualized. Aorta: The aortic root and ascending aorta are structurally normal, with no evidence of dilitation. Venous: The inferior vena cava is normal in size with greater than 50% respiratory variability, suggesting right atrial pressure of 3 mmHg. IAS/Shunts: No atrial level shunt detected by color flow Doppler.   LEFT VENTRICLE PLAX 2D LVIDd:         5.10 cm      Diastology LVIDs:         3.40 cm      LV e' medial:    6.64 cm/s LV PW:         1.40 cm      LV E/e' medial:  12.2 LV IVS:        1.20 cm      LV e' lateral:   6.64 cm/s LVOT diam:     2.00 cm      LV E/e' lateral: 12.2 LV SV:         68 LV SV Index:   25 LVOT Area:     3.14 cm  LV Volumes (MOD) LV vol d, MOD A2C: 157.0 ml LV vol d, MOD A4C: 160.0 ml LV vol s, MOD A2C: 56.8 ml LV vol s, MOD A4C: 57.1 ml LV SV MOD A2C:     100.2 ml LV SV MOD A4C:     160.0 ml LV SV MOD BP:      100.9 ml RIGHT VENTRICLE             IVC RV Basal diam:  4.60 cm     IVC diam: 2.30 cm RV Mid diam:    3.40 cm RV S prime:     11.60 cm/s TAPSE (M-mode): 1.8 cm LEFT ATRIUM             Index        RIGHT ATRIUM           Index LA diam:        4.20 cm 1.53 cm/m   RA Area:     21.60 cm LA Vol (A2C):   47.1 ml 17.18 ml/m  RA Volume:   68.40 ml  24.94  ml/m LA Vol (A4C):   31.2 ml 11.38 ml/m LA Biplane Vol: 42.1 ml 15.35 ml/m  AORTIC VALVE AV Area (Vmax):    1.34 cm AV Area (Vmean):   1.27 cm AV Area (VTI):     1.35 cm AV Vmax:           289.50 cm/s AV Vmean:          209.500 cm/s AV VTI:            0.506 m AV Peak Grad:      33.5 mmHg AV Mean Grad:      19.0 mmHg LVOT Vmax:         123.50 cm/s LVOT Vmean:        84.450 cm/s LVOT VTI:          0.217 m LVOT/AV VTI ratio: 0.43  AORTA Ao Asc diam: 3.50 cm MITRAL VALVE               TRICUSPID VALVE MV Area (PHT): 4.46 cm    TR Peak grad:   23.8 mmHg MV Decel Time: 170 msec    TR Vmax:        244.00 cm/s MV E velocity: 81.10 cm/s MV A velocity: 87.40 cm/s  SHUNTS MV E/A ratio:  0.93        Systemic VTI:  0.22 m                            Systemic Diam: 2.00 cm Dorris Carnes MD Electronically signed by Dorris Carnes MD Signature Date/Time: 08/29/2021/4:14:15 PM    Final      Imaging independently reviewed in Epic.    [facility] [phone] pager 08/31/2021, 9:33 AM  I  spent greater than 35 minutes with the patient including greater than 50% of time in face to face counsel of the patient and in coordination of their care.

## 2021-08-31 NOTE — Progress Notes (Signed)
Occupational Therapy Progress Note  Pt is progressing towards OT goals. During session, completed LB dressing with Mod A (see details below). Continue to recommend CIR. Will continue to follow    08/31/21 1521  OT Visit Information  Last OT Received On 08/31/21  Assistance Needed +1  History of Present Illness Pt is a 45 y.o. male admitted 12/1 with L foot infection. He underwent L BKA 12/2. PMH: diabetes mellitus type 2, mechanical aortic valve replacement on warfarin, coronary disease, hypertension, hyperlipidemia, morbid obesity  Precautions  Precautions Fall;Other (comment)  Precaution Comments wound vac  Required Braces or Orthoses Other Brace  Other Brace limb protector  Restrictions  Weight Bearing Restrictions Yes  LLE Weight Bearing NWB  Other Position/Activity Restrictions L limb protector  Pain Assessment  Pain Assessment Faces  Faces Pain Scale 2  Pain Location L residual limb  Pain Descriptors / Indicators Discomfort;Sore  Pain Intervention(s) Monitored during session  Cognition  Arousal/Alertness Awake/alert  Behavior During Therapy WFL for tasks assessed/performed  Overall Cognitive Status Within Functional Limits for tasks assessed  ADL  Overall ADL's  Needs assistance/impaired  Lower Body Dressing Moderate assistance  Lower Body Dressing Details (indicate cue type and reason) Able to don sock on R foot at EOB with bed in lowest setting and RLE half-propped on bed. Assist to don underwear for managing wound vac and technique. Also requiring assist to stand and maintain balance to pull underwear over hips.  Bed Mobility  Overal bed mobility Needs Assistance  Bed Mobility Sit to Supine;Supine to Sit  Supine to sit Supervision  Sit to supine Supervision  Transfers  Overall transfer level Needs assistance  Equipment used Rolling walker (2 wheels)  Transfers Sit to/from Stand;Bed to chair/wheelchair/BSC  Sit to Stand Min assist  Balance  Overall balance  assessment Needs assistance  Sitting-balance support Feet supported;No upper extremity supported  Sitting balance-Leahy Scale Good  Standing balance support Bilateral upper extremity supported;During functional activity;Reliant on assistive device for balance  Standing balance-Leahy Scale Poor  OT - End of Session  Equipment Utilized During Treatment Rolling walker (2 wheels)  Activity Tolerance Patient tolerated treatment well  Patient left in bed;with call bell/phone within reach  Nurse Communication Mobility status  OT Assessment/Plan  OT Plan Discharge plan remains appropriate;Frequency remains appropriate  OT Visit Diagnosis Unsteadiness on feet (R26.81);Pain;Muscle weakness (generalized) (M62.81)  OT Frequency (ACUTE ONLY) Min 2X/week  Follow Up Recommendations Acute inpatient rehab (3hours/day)  Assistance recommended at discharge Frequent or constant Supervision/Assistance  OT Equipment BSC/3in1;Wheelchair (measurements OT);Wheelchair cushion (measurements OT)  AM-PAC OT "6 Clicks" Daily Activity Outcome Measure (Version 2)  Help from another person eating meals? 4  Help from another person taking care of personal grooming? 3  Help from another person toileting, which includes using toliet, bedpan, or urinal? 2  Help from another person bathing (including washing, rinsing, drying)? 2  Help from another person to put on and taking off regular upper body clothing? 3  Help from another person to put on and taking off regular lower body clothing? 2  6 Click Score 16  Progressive Mobility  What is the highest level of mobility based on the progressive mobility assessment? Level 4 (Walks with assist in room) - Balance while marching in place and cannot step forward and back - Complete  Mobility Out of bed for toileting;Out of bed to chair with meals  OT Goal Progression  Progress towards OT goals Progressing toward goals  Acute Rehab OT Goals  OT  Goal Formulation With patient  Time  For Goal Achievement 09/12/21  Potential to Achieve Goals Good  ADL Goals  Pt Will Perform Lower Body Bathing with set-up;sitting/lateral leans  Pt Will Perform Lower Body Dressing with set-up;sitting/lateral leans  Pt Will Transfer to Toilet with supervision;ambulating;bedside commode  Pt Will Perform Toileting - Clothing Manipulation and hygiene sitting/lateral leans;with set-up  OT Time Calculation  OT Start Time (ACUTE ONLY) 1440  OT Stop Time (ACUTE ONLY) 1506  OT Time Calculation (min) 26 min  OT General Charges  $OT Visit 1 Visit  OT Treatments  $Self Care/Home Management  23-37 mins   Rosina Cressler C, OT/L  Acute Rehab [phone]

## 2021-08-31 NOTE — Progress Notes (Addendum)
ANTICOAGULATION CONSULT NOTE - Initial Consult  Pharmacy Consult for Coumadin and lovenox dosing Indication: VTE prophylaxis s/p BKA in a patient with a mechanical aortic valve replacement (12/05/2019) requiring anticoagulation  Allergies  Allergen Reactions   Pollen Extract Other (See Comments)    Runny nose, watery eyes, sneezing    Patient Measurements: Height: 5\' 10"  (177.8 cm) Weight: (!) 172.4 kg (380 lb 1.2 oz) IBW/kg (Calculated) : 73   Vital Signs: Temp: 98.4 F (36.9 C) (12/05 0833) Temp Source: Oral (12/05 0833) BP: 112/66 (12/05 0833) Pulse Rate: 91 (12/05 0833)  Labs: Recent Labs    08/29/21 0036 08/30/21 0336 08/30/21 0945 08/31/21 0446  HGB 10.4* 10.7* 11.2*  --   HCT 31.9* 33.4* 33.8*  --   PLT 290 322 371  --   LABPROT 24.8* 17.1*  --  17.3*  INR 2.2* 1.4*  --  1.4*  CREATININE  --   --   --  0.64     Estimated Creatinine Clearance: 186 mL/min (by C-G formula based on SCr of 0.64 mg/dL).   Medical History: Past Medical History:  Diagnosis Date   Anxiety    Aortic stenosis    Cellulitis and abscess of lower extremity 06/11/2019   Cellulitis of fourth toe of left foot    Cholelithiasis    Coronary artery disease    Nonobstructive CAD (40-50% LAD) 08/2019   Depression    Depression    Phreesia 09/27/2020   Depression    Phreesia 11/01/2020   Diabetes mellitus without complication (HCC)    Phreesia 09/27/2020   Elevated troponin level not due myocardial infarction 11/11/2019   Essential hypertension    Gangrene of toe of left foot (HCC) 07/06/2019   Heart murmur    Phreesia 09/27/2020   Hyperlipidemia    Phreesia 09/27/2020   Hypertension    Phreesia 09/27/2020   Mixed hyperlipidemia    Morbid obesity (HCC)    S/P aortic valve replacement with mechanical valve 12/05/2019   25 mm Carbomedics top hat bileaflet mechanical valve via partial upper hemi-sternotomy   Severe aortic stenosis 09/24/2019   Type 2 diabetes mellitus (HCC)      Medications:  Medications Prior to Admission  Medication Sig Dispense Refill Last Dose   atorvastatin (LIPITOR) 80 MG tablet Take 1 tablet (80 mg total) by mouth daily. 90 tablet 3 Past Week   insulin glargine (LANTUS SOLOSTAR) 100 UNIT/ML Solostar Pen Inject 30 Units into the skin at bedtime. 30 mL 3 Past Week   lisinopril (ZESTRIL) 20 MG tablet Take 1 tablet by mouth once daily 90 tablet 0 08/26/2021   metoprolol tartrate (LOPRESSOR) 25 MG tablet Take 1 tablet by mouth twice daily 180 tablet 0 08/26/2021 at 0800   sertraline (ZOLOFT) 50 MG tablet Take 1 tablet by mouth once daily 90 tablet 1 08/26/2021   sitaGLIPtin-metformin (JANUMET) 50-500 MG tablet Take 1 tablet by mouth 2 (two) times daily with a meal. 180 tablet 3 08/26/2021   warfarin (COUMADIN) 10 MG tablet Take 1 tablet daily. 45 tablet 5 08/25/2021 at 1800    Assessment:  Patient with a history of aortic valve replacement on December 05, 2019 (Carbomedics Top Hat mechanical in position 1).    PTA Coumadin dose 10mg  daily ,  INR was therapeutic on admit. Coumadin held on admission for surgery.   S/p  left BKA 12/2.  Coumadin resumed 08/29/21.  Need for continued anticoagulation s/p BKA with limited mobility d/t recent surgery and a subtherapeutic INR (  1.4 on 08/30/21)  requiring overlapping anticoagulation until INR is therapeutic (2.0 - 3.0) given his co-morbidities.  Thus started Lovenox bridge 12/4, with coumadin.    Lovenox dose is 150 mg sq Q12 hour.  The 4 hour anti-Xa level = 0.89 units/ml which is therapeutic (goal 0.6-1 units/ml).  INR: today remains at 1.4 (08/31/21) Hgb 11.2 stable, pltc wnl stable. No bleeding reported  Goal of Therapy:  INR 2-3 Anti-Xa level 0.6-1 units/ml 4hrs after LMWH dose given Monitor platelets by anticoagulation protocol: Yes   Plan:  Lovenox 150 mg sq q12h until INR is 2.0 or higher.  Coumadin 12mg  po x 1 today  Monitor INR daily CBC every 72 hours   , Noah Delaine Clinical  Pharmacist [phone] 08/31/2021,1:09 PM Please check AMION for all [facility] Pharmacy phone numbers After 10:00 PM, call Main Pharmacy [phone]

## 2021-09-01 ENCOUNTER — Inpatient Hospital Stay (HOSPITAL_COMMUNITY): Payer: 59 | Admitting: Certified Registered"

## 2021-09-01 ENCOUNTER — Encounter (HOSPITAL_COMMUNITY)
Admission: EM | Disposition: A | Discharge status: Rehabilitation Facility | Payer: Self-pay | Source: Home / Self Care | Attending: Internal Medicine

## 2021-09-01 ENCOUNTER — Inpatient Hospital Stay: Payer: Self-pay

## 2021-09-01 ENCOUNTER — Inpatient Hospital Stay (HOSPITAL_COMMUNITY): Payer: 59

## 2021-09-01 ENCOUNTER — Encounter (HOSPITAL_COMMUNITY): Payer: Self-pay | Admitting: Internal Medicine

## 2021-09-01 DIAGNOSIS — I358 Other nonrheumatic aortic valve disorders: Secondary | ICD-10-CM | POA: Diagnosis not present

## 2021-09-01 DIAGNOSIS — R7881 Bacteremia: Secondary | ICD-10-CM | POA: Diagnosis not present

## 2021-09-01 DIAGNOSIS — Z952 Presence of prosthetic heart valve: Secondary | ICD-10-CM | POA: Diagnosis not present

## 2021-09-01 DIAGNOSIS — E11628 Type 2 diabetes mellitus with other skin complications: Secondary | ICD-10-CM | POA: Diagnosis not present

## 2021-09-01 HISTORY — PX: TEE WITHOUT CARDIOVERSION: SHX5443

## 2021-09-01 LAB — PROTIME-INR
INR: 1.4 — ABNORMAL HIGH (ref 0.8–1.2)
Prothrombin Time: 17.4 seconds — ABNORMAL HIGH (ref 11.4–15.2)

## 2021-09-01 LAB — GLUCOSE, CAPILLARY
Glucose-Capillary: 120 mg/dL — ABNORMAL HIGH (ref 70–99)
Glucose-Capillary: 107 mg/dL — ABNORMAL HIGH (ref 70–99)
Glucose-Capillary: 140 mg/dL — ABNORMAL HIGH (ref 70–99)
Glucose-Capillary: 139 mg/dL — ABNORMAL HIGH (ref 70–99)

## 2021-09-01 LAB — CULTURE, BLOOD (ROUTINE X 2)
Culture: NO GROWTH
Special Requests: ADEQUATE

## 2021-09-01 LAB — ECHO TEE: AV Mean grad: 13 mmHg

## 2021-09-01 SURGERY — ECHOCARDIOGRAM, TRANSESOPHAGEAL
Anesthesia: Monitor Anesthesia Care

## 2021-09-01 MED ORDER — PHENYLEPHRINE 40 MCG/ML (10ML) SYRINGE FOR IV PUSH (FOR BLOOD PRESSURE SUPPORT)
PREFILLED_SYRINGE | INTRAVENOUS | Status: DC | PRN
  Administered 2021-09-01 (×2): 80 ug via INTRAVENOUS
  Administered 2021-09-01: 120 ug via INTRAVENOUS

## 2021-09-01 MED ORDER — PROPOFOL 500 MG/50ML IV EMUL
INTRAVENOUS | Status: DC | PRN
  Administered 2021-09-01: 120 ug/kg/min via INTRAVENOUS

## 2021-09-01 MED ORDER — CHLORHEXIDINE GLUCONATE CLOTH 2 % EX PADS
6.0000 | MEDICATED_PAD | Freq: Every day | CUTANEOUS | Status: DC
  Administered 2021-09-01: 6 via TOPICAL

## 2021-09-01 MED ORDER — PROPOFOL 10 MG/ML IV BOLUS
INTRAVENOUS | Status: DC | PRN
  Administered 2021-09-01 (×2): 20 mg via INTRAVENOUS

## 2021-09-01 MED ORDER — WARFARIN SODIUM 7.5 MG PO TABS
15.0000 mg | ORAL_TABLET | Freq: Every day | ORAL | Status: DC
  Administered 2021-09-01: 15 mg via ORAL
  Filled 2021-09-01: qty 2

## 2021-09-01 MED ORDER — SODIUM CHLORIDE 0.9 % IV SOLN
INTRAVENOUS | Status: AC | PRN
  Administered 2021-09-01: 500 mL via INTRAMUSCULAR

## 2021-09-01 MED ORDER — SODIUM CHLORIDE 0.9% FLUSH: 10.0000 mL | INTRAVENOUS | Status: DC | PRN

## 2021-09-01 MED ORDER — LIDOCAINE 2% (20 MG/ML) 5 ML SYRINGE
INTRAMUSCULAR | Status: DC | PRN
  Administered 2021-09-01: 100 mg via INTRAVENOUS

## 2021-09-01 NOTE — Interval H&P Note (Signed)
History and Physical Interval Note:  09/01/2021 10:07 AM  [name]  has presented today for surgery, with the diagnosis of BACTEREMIA.  The various methods of treatment have been discussed with the patient and family. After consideration of risks, benefits and other options for treatment, the patient has consented to  Procedure(s): TRANSESOPHAGEAL ECHOCARDIOGRAM (TEE) (N/A) as a surgical intervention.  The patient's history has been reviewed, patient examined, no change in status, stable for surgery.  I have reviewed the patient's chart and labs.  Questions were answered to the patient's satisfaction.     Adam Long

## 2021-09-01 NOTE — Progress Notes (Signed)
Inpatient Rehab Admissions Coordinator:   I do not have a bed for this Pt. Today on CIR but will follow for potential admit pending bed availability and medical readiness.   Megan Salon, MS, CCC-SLP Rehab Admissions Coordinator  [phone] (celll) [phone] (office)

## 2021-09-01 NOTE — Transfer of Care (Signed)
Immediate Anesthesia Transfer of Care Note  Patient: Adam Long   Procedure(s) Performed: TRANSESOPHAGEAL ECHOCARDIOGRAM (TEE)  Patient Location: Endoscopy Unit  Anesthesia Type:MAC  Level of Consciousness: awake, alert , oriented and patient cooperative  Airway & Oxygen Therapy: Patient Spontanous Breathing and Patient connected to face mask oxygen  Post-op Assessment: Report given to RN and Post -op Vital signs reviewed and stable  Post vital signs: Reviewed and stable  Last Vitals:  Vitals Value Taken Time  BP 81/49 09/01/21 1117  Temp    Pulse 84 09/01/21 1119  Resp 28 09/01/21 1119  SpO2 98 % 09/01/21 1119  Vitals shown include unvalidated device data.  Last Pain:  Vitals:   09/01/21 1117  TempSrc: Oral  PainSc:       Patients Stated Pain Goal: 2 (84/66/59 9357)  Complications: No notable events documented.

## 2021-09-01 NOTE — H&P (View-Only) (Signed)
[facility] PROGRESS NOTE    Progress Note  Adam Long  VOJ:[id number] DOB: 1975/11/17 DOA: 08/27/2021 PCP: Heather Roberts, NP     Brief Narrative:   Adam Long is an 45 y.o. male past medical history significant for diabetes mellitus type 2, mechanical aortic valve on warfarin, hypertension morbid obesity comes in with an infected diabetic foot.  Orthopedic surgery was consulted, who performed transtibial amputation with application of wound VAC.  Blood culture on admission was positive for MSSA cellulitis blood cultures were drawn. Infectious disease was consulted she was transitioned to IV cefazolin they recommended a TEE   Assessment/Plan:   Left Diabetic foot infection/MSSA bacteremia: Continue IV cefazolin.  BC ID was positive for MSSA. Surveillance blood cultures from 08/28/2021 have been negative till date. ID on board and recommended at least 1 month of IV antibiotics.   TEE on 09/01/2021 at 130. Physical therapy and Occupational Therapy evaluated the patient recommended inpatient rehab. Will likely need PICC line  Mechanical aortic valve: INR continues to be subtherapeutic, continue Lovenox and Coumadin INR is 1.4.  Uncontrolled diabetes mellitus type 2 with hyperglycemia: A1c is 7.8. Blood glucose fairly controlled continue current regimen.  Blood glucose mildly increased continue current regimen. He is currently n.p.o. for TEE  Essential  HTN (hypertension) Well-controlled continue current regimen.  Hyperlipidemia: Continue statins.  Morbid obesity: With a BMI of 52 counseling.   DVT prophylaxis: Hepairn Family Communication:none Status is: Inpatient  Remains inpatient appropriate because: Diabetic foot infection requiring amputation        Code Status:     Code Status Orders  (From admission, onward)           Start     Ordered   08/28/21 0254  Full code  Continuous        08/28/21 0253           Code Status History      Date Active Date Inactive Code Status Order ID Comments User Context   02/12/2021 2321 02/17/2021 1958 Full Code [id number]  Frankey Shown, DO Inpatient   12/05/2019 1444 12/12/2019 1546 Full Code [id number]  Sharlene Dory, PA-C Inpatient   11/11/2019 0042 11/11/2019 1714 Full Code [id number]  Starlyn Skeans, MD Inpatient   09/24/2019 0821 09/24/2019 1538 Full Code [id number]  Tonny Bollman, MD Inpatient   07/06/2019 1835 07/10/2019 1432 Full Code [id number]  Sherren Kerns, MD Inpatient   06/11/2019 1707 06/14/2019 1844 Full Code [id number]  Shon Hale, MD Inpatient   07/02/2013 1545 07/03/2013 1535 Full Code [id number]  Esperanza Sheets, MD ED         IV Access:   Peripheral IV   Procedures and diagnostic studies:   No results found.   Medical Consultants:   None.   Subjective:    Adam Long no complaints this morning, he relates he is hungry.  Objective:    Vitals:   08/31/21 0833 08/31/21 1357 08/31/21 2034 09/01/21 0408  BP: 112/66 96/68 (!) 106/58 (!) 114/59  Pulse: 91 90 83 70  Resp: 18 17 20 20   Temp: 98.4 F (36.9 C) 98.4 F (36.9 C) 99.2 F (37.3 C) 98.4 F (36.9 C)  TempSrc: Oral  Oral   SpO2: 94% 100% 97% 98%  Weight:      Height:       SpO2: 98 % O2 Flow Rate (L/min): 0 L/min FiO2 (%): 21 %   Intake/Output Summary (Last 24 hours) at 09/01/2021 0757 Last data  filed at 09/01/2021 [id number] Gross per 24 hour  Intake 940 ml  Output 1550 ml  Net -610 ml    Filed Weights   08/27/21 0853 08/28/21 1023  Weight: (!) 172.4 kg (!) 172.4 kg    Exam: General exam: In no acute distress. Respiratory system: Good air movement and clear to auscultation. Cardiovascular system: S1 & S2 heard, RRR. No JVD. Gastrointestinal system: Abdomen is nondistended, soft and nontender.  Skin: No rashes, lesions or ulcers Psychiatry: Judgement and insight appear normal. Mood & affect appropriate. Data Reviewed:    Labs: Basic Metabolic Panel: Recent  Labs  Lab 08/27/21 0950 08/28/21 0545 08/31/21 0446  NA 132* 134*  --   K 4.3 4.3  --   CL 97* 101  --   CO2 26 24  --   GLUCOSE 212* 157*  --   BUN 11 9  --   CREATININE 0.72 0.67 0.64  CALCIUM 8.7* 8.7*  --     GFR Estimated Creatinine Clearance: 186 mL/min (by C-G formula based on SCr of 0.64 mg/dL). Liver Function Tests: Recent Labs  Lab 08/27/21 0950  AST 20  ALT 26  ALKPHOS 76  BILITOT 0.7  PROT 7.4  ALBUMIN 2.7*    No results for input(s): LIPASE, AMYLASE in the last 168 hours. No results for input(s): AMMONIA in the last 168 hours. Coagulation profile Recent Labs  Lab 08/28/21 1911 08/29/21 0036 08/30/21 0336 08/31/21 0446 09/01/21 0155  INR 2.3* 2.2* 1.4* 1.4* 1.4*    COVID-19 Labs  No results for input(s): DDIMER, FERRITIN, LDH, CRP in the last 72 hours.   Lab Results  Component Value Date   SARSCOV2NAA NEGATIVE 08/27/2021   [place] NEGATIVE 02/12/2021   [place] NEGATIVE 12/03/2019   [place] NEGATIVE 11/10/2019    CBC: Recent Labs  Lab 08/27/21 0950 08/28/21 0545 08/29/21 0036 08/30/21 0336 08/30/21 0945  WBC 15.3* 11.3* 13.1* 13.4* 12.3*  NEUTROABS 12.7*  --   --   --   --   HGB 10.7* 11.1* 10.4* 10.7* 11.2*  HCT 33.1* 34.3* 31.9* 33.4* 33.8*  MCV 86.9 84.3 83.3 84.3 83.3  PLT 266 282 290 322 371    Cardiac Enzymes: No results for input(s): CKTOTAL, CKMB, CKMBINDEX, TROPONINI in the last 168 hours. BNP (last 3 results) No results for input(s): PROBNP in the last 8760 hours. CBG: Recent Labs  Lab 08/30/21 2123 08/31/21 0811 08/31/21 1255 08/31/21 1715 08/31/21 2033  GLUCAP 141* 151* 150* 128* 176*    D-Dimer: No results for input(s): DDIMER in the last 72 hours. Hgb A1c: No results for input(s): HGBA1C in the last 72 hours.  Lipid Profile: No results for input(s): CHOL, HDL, LDLCALC, TRIG, CHOLHDL, LDLDIRECT in the last 72 hours. Thyroid function studies: No results for input(s): TSH, T4TOTAL, T3FREE,  THYROIDAB in the last 72 hours.  Invalid input(s): FREET3 Anemia work up: No results for input(s): VITAMINB12, FOLATE, FERRITIN, TIBC, IRON, RETICCTPCT in the last 72 hours. Sepsis Labs: Recent Labs  Lab 08/28/21 0545 08/29/21 0036 08/30/21 0336 08/30/21 0945  WBC 11.3* 13.1* 13.4* 12.3*    Microbiology Recent Results (from the past 240 hour(s))  Resp Panel by RT-PCR (Flu A&B, Covid) Nasopharyngeal Swab     Status: None   Collection Time: 08/27/21 10:03 AM   Specimen: Nasopharyngeal Swab; Nasopharyngeal(NP) swabs in vial transport medium  Result Value Ref Range Status   SARS Coronavirus 2 by RT PCR NEGATIVE NEGATIVE Final    Comment: (NOTE) SARS-CoV-2 target nucleic  acids are NOT DETECTED.  The SARS-CoV-2 RNA is generally detectable in upper respiratory specimens during the acute phase of infection. The lowest concentration of SARS-CoV-2 viral copies this assay can detect is 138 copies/mL. A negative result does not preclude SARS-Cov-2 infection and should not be used as the sole basis for treatment or other patient management decisions. A negative result may occur with  improper specimen collection/handling, submission of specimen other than nasopharyngeal swab, presence of viral mutation(s) within the areas targeted by this assay, and inadequate number of viral copies(<138 copies/mL). A negative result must be combined with clinical observations, patient history, and epidemiological information. The expected result is Negative.  Fact Sheet for Patients:  EntrepreneurPulse.com.au  Fact Sheet for Healthcare Providers:  IncredibleEmployment.be  This test is no t yet approved or cleared by the [place] FDA and  has been authorized for detection and/or diagnosis of SARS-CoV-2 by FDA under an Emergency Use Authorization (EUA). This EUA will remain  in effect (meaning this test can be used) for the duration of the COVID-19 declaration  under Section 564(b)(1) of the Act, 21 U.S.C.section 360bbb-3(b)(1), unless the authorization is terminated  or revoked sooner.       Influenza A by PCR NEGATIVE NEGATIVE Final   Influenza B by PCR NEGATIVE NEGATIVE Final    Comment: (NOTE) The Xpert Xpress SARS-CoV-2/FLU/RSV plus assay is intended as an aid in the diagnosis of influenza from Nasopharyngeal swab specimens and should not be used as a sole basis for treatment. Nasal washings and aspirates are unacceptable for Xpert Xpress SARS-CoV-2/FLU/RSV testing.  Fact Sheet for Patients: EntrepreneurPulse.com.au  Fact Sheet for Healthcare Providers: IncredibleEmployment.be  This test is not yet approved or cleared by the [place] FDA and has been authorized for detection and/or diagnosis of SARS-CoV-2 by FDA under an Emergency Use Authorization (EUA). This EUA will remain in effect (meaning this test can be used) for the duration of the COVID-19 declaration under Section 564(b)(1) of the Act, 21 U.S.C. section 360bbb-3(b)(1), unless the authorization is terminated or revoked.  Performed at [facility], [address] [place], [place] [address]   Culture, blood (routine x 2)     Status: None   Collection Time: 08/27/21 10:28 AM   Specimen: Left Antecubital; Blood  Result Value Ref Range Status   Specimen Description LEFT ANTECUBITAL  Final   Special Requests   Final    BOTTLES DRAWN AEROBIC AND ANAEROBIC Blood Culture adequate volume   Culture   Final    NO GROWTH 5 DAYS Performed at [facility], [address], [place], [place] [address]    Report Status 09/01/2021 FINAL  Final  Culture, blood (routine x 2)     Status: Abnormal   Collection Time: 08/27/21 10:40 AM   Specimen: Right Antecubital; Blood  Result Value Ref Range Status   Specimen Description   Final    RIGHT ANTECUBITAL Performed at [facility], [address], [place], [place] [address]    Special Requests    Final    BOTTLES DRAWN AEROBIC AND ANAEROBIC Blood Culture results may not be optimal due to an excessive volume of blood received in culture bottles Performed at [facility], [address], [place], [place] [address]    Culture  Setup Time   Final    GRAM POSITIVE COCCI IN BOTH AEROBIC AND ANAEROBIC BOTTLES Gram Stain Report Called to,Read Back By and Verified With: Coleman,[email or web address]  by Zigmund Daniel, b 12.2.22 CRITICAL RESULT CALLED TO, READ  BACK BY AND VERIFIED WITH: Cathlyn Parsons [id number] AT 1306 BY CM Performed at [facility], [place] [address], [place], [place] [address]    Culture STAPHYLOCOCCUS AUREUS STREPTOCOCCUS AGALACTIAE  (A)  Final   Report Status 08/31/2021 FINAL  Final   Organism ID, Bacteria STAPHYLOCOCCUS AUREUS  Final   Organism ID, Bacteria STREPTOCOCCUS AGALACTIAE  Final      Susceptibility   Streptococcus agalactiae - MIC*    CLINDAMYCIN <=0.25 SENSITIVE Sensitive     AMPICILLIN <=0.25 SENSITIVE Sensitive     ERYTHROMYCIN <=0.12 SENSITIVE Sensitive     VANCOMYCIN 0.5 SENSITIVE Sensitive     CEFTRIAXONE <=0.12 SENSITIVE Sensitive     LEVOFLOXACIN 1 SENSITIVE Sensitive     * STREPTOCOCCUS AGALACTIAE   Staphylococcus aureus - MIC*    CIPROFLOXACIN <=0.5 SENSITIVE Sensitive     ERYTHROMYCIN <=0.25 SENSITIVE Sensitive     GENTAMICIN <=0.5 SENSITIVE Sensitive     OXACILLIN 0.5 SENSITIVE Sensitive     TETRACYCLINE <=1 SENSITIVE Sensitive     VANCOMYCIN 1 SENSITIVE Sensitive     TRIMETH/SULFA <=10 SENSITIVE Sensitive     CLINDAMYCIN <=0.25 SENSITIVE Sensitive     RIFAMPIN <=0.5 SENSITIVE Sensitive     Inducible Clindamycin NEGATIVE Sensitive     * STAPHYLOCOCCUS AUREUS  Blood Culture ID Panel (Reflexed)     Status: Abnormal   Collection Time: 08/27/21 10:40 AM  Result Value Ref Range Status   Enterococcus faecalis NOT DETECTED NOT DETECTED Final   Enterococcus Faecium NOT DETECTED NOT DETECTED Final   Listeria monocytogenes NOT DETECTED NOT DETECTED  Final   Staphylococcus species DETECTED (A) NOT DETECTED Final    Comment: CRITICAL RESULT CALLED TO, READ BACK BY AND VERIFIED WITH: PHARMD E MARTIN [id number] AT 1306 BY CM    Staphylococcus aureus (BCID) DETECTED (A) NOT DETECTED Final    Comment: CRITICAL RESULT CALLED TO, READ BACK BY AND VERIFIED WITH: PHARMD E MARTIN [id number] AT 1306 BY CM    Staphylococcus epidermidis NOT DETECTED NOT DETECTED Final   Staphylococcus lugdunensis NOT DETECTED NOT DETECTED Final   Streptococcus species NOT DETECTED NOT DETECTED Final   Streptococcus agalactiae NOT DETECTED NOT DETECTED Final   Streptococcus pneumoniae NOT DETECTED NOT DETECTED Final   Streptococcus pyogenes NOT DETECTED NOT DETECTED Final   A.calcoaceticus-baumannii NOT DETECTED NOT DETECTED Final   Bacteroides fragilis NOT DETECTED NOT DETECTED Final   Enterobacterales NOT DETECTED NOT DETECTED Final   Enterobacter cloacae complex NOT DETECTED NOT DETECTED Final   Escherichia coli NOT DETECTED NOT DETECTED Final   Klebsiella aerogenes NOT DETECTED NOT DETECTED Final   Klebsiella oxytoca NOT DETECTED NOT DETECTED Final   Klebsiella pneumoniae NOT DETECTED NOT DETECTED Final   Proteus species NOT DETECTED NOT DETECTED Final   Salmonella species NOT DETECTED NOT DETECTED Final   Serratia marcescens NOT DETECTED NOT DETECTED Final   Haemophilus influenzae NOT DETECTED NOT DETECTED Final   Neisseria meningitidis NOT DETECTED NOT DETECTED Final   Pseudomonas aeruginosa NOT DETECTED NOT DETECTED Final   Stenotrophomonas maltophilia NOT DETECTED NOT DETECTED Final   Candida albicans NOT DETECTED NOT DETECTED Final   Candida auris NOT DETECTED NOT DETECTED Final   Candida glabrata NOT DETECTED NOT DETECTED Final   Candida krusei NOT DETECTED NOT DETECTED Final   Candida parapsilosis NOT DETECTED NOT DETECTED Final   Candida tropicalis NOT DETECTED NOT DETECTED Final   Cryptococcus neoformans/gattii NOT DETECTED NOT DETECTED Final   Meth  resistant mecA/C and MREJ NOT  DETECTED NOT DETECTED Final    Comment: Performed at [facility], [place] [address] [place], [place] [address]  Surgical PCR screen     Status: Abnormal   Collection Time: 08/28/21  9:41 AM   Specimen: Nasal Mucosa; Nasal Swab  Result Value Ref Range Status   MRSA, PCR NEGATIVE NEGATIVE Final   Staphylococcus aureus POSITIVE (A) NEGATIVE Final    Comment: (NOTE) The Xpert SA Assay (FDA approved for NASAL specimens in patients 22 years of age and older), is one component of a comprehensive surveillance program. It is not intended to diagnose infection nor to guide or monitor treatment. Performed at [facility], [place] [address], [place], [place] [address]   Culture, blood (Routine X 2) w Reflex to ID Panel     Status: None (Preliminary result)   Collection Time: 08/28/21  3:31 PM   Specimen: BLOOD  Result Value Ref Range Status   Specimen Description BLOOD RIGHT ARM  Final   Special Requests   Final    BOTTLES DRAWN AEROBIC AND ANAEROBIC Blood Culture adequate volume   Culture   Final    NO GROWTH 3 DAYS Performed at [facility], [place] [address] [place], [place] [address]    Report Status PENDING  Incomplete  Culture, blood (Routine X 2) w Reflex to ID Panel     Status: None (Preliminary result)   Collection Time: 08/28/21  3:34 PM   Specimen: BLOOD  Result Value Ref Range Status   Specimen Description BLOOD LEFT HAND  Final   Special Requests   Final    BOTTLES DRAWN AEROBIC ONLY Blood Culture adequate volume   Culture   Final    NO GROWTH 3 DAYS Performed at [facility], [place] [address] [place], [place] [address]    Report Status PENDING  Incomplete     Medications:    vitamin C  1,000 mg Oral Daily   atorvastatin  80 mg Oral Daily   docusate sodium  100 mg Oral Daily   enoxaparin (LOVENOX) injection  150 mg Subcutaneous Q12H   insulin aspart  0-15 Units Subcutaneous TID WC   insulin aspart  0-5 Units  Subcutaneous QHS   insulin glargine-yfgn  15 Units Subcutaneous Daily   lisinopril  10 mg Oral Daily   metoprolol tartrate  25 mg Oral BID   mupirocin ointment  1 application Nasal BID   nutrition supplement (JUVEN)  1 packet Oral BID BM   pantoprazole  40 mg Oral Daily   sertraline  50 mg Oral Daily   warfarin  12 mg Oral q1600   Warfarin - Pharmacist Dosing Inpatient   Does not apply q1600   zinc sulfate  220 mg Oral Daily   Continuous Infusions:  sodium chloride Stopped (08/29/21 0535)   sodium chloride 20 mL/hr at 08/31/21 1817    ceFAZolin (ANCEF) IV 2 g (09/01/21 0305)   magnesium sulfate bolus IVPB        LOS: 5 days   Charlynne Cousins  [facility]  09/01/2021, 7:57 AM

## 2021-09-01 NOTE — CV Procedure (Signed)
     TRANSESOPHAGEAL ECHOCARDIOGRAM   NAME:  Adam Long   MRN: [id number] DOB:  12-Feb-1976   ADMIT DATE: 08/27/2021  INDICATIONS: Bacteremia  PROCEDURE:   Informed consent was obtained prior to the procedure. The risks, benefits and alternatives for the procedure were discussed and the patient comprehended these risks.  Risks include, but are not limited to, cough, sore throat, vomiting, nausea, somnolence, esophageal and stomach trauma or perforation, bleeding, low blood pressure, aspiration, pneumonia, infection, trauma to the teeth and death.    After a procedural time-out, the oropharynx was anesthetized and the patient was sedated by the anesthesia service. The transesophageal probe was inserted in the esophagus and stomach without difficulty and multiple views were obtained. Anesthesia was monitored by Dionne Bucy, CRNA.    COMPLICATIONS:    There were no immediate complications.  FINDINGS:  No vegetation seen   Epifanio Lesches MD [facility]  [address] [place], [place] [address] [phone]   11:23 AM

## 2021-09-01 NOTE — Anesthesia Preprocedure Evaluation (Signed)
Anesthesia Evaluation  Patient identified by MRN, date of birth, ID band Patient awake    Reviewed: Allergy & Precautions, NPO status , Patient's Chart, lab work & pertinent test results, reviewed documented beta blocker date and time   Airway Mallampati: III  TM Distance: >3 FB Neck ROM: Full    Dental  (+) Dental Advisory Given, Edentulous Upper, Edentulous Lower   Pulmonary neg pulmonary ROS,    Pulmonary exam normal breath sounds clear to auscultation       Cardiovascular hypertension, Pt. on home beta blockers and Pt. on medications (-) angina+ CAD  + Valvular Problems/Murmurs (s/p AVR)  Rhythm:Regular Rate:Normal + Systolic murmurs    Neuro/Psych PSYCHIATRIC DISORDERS Anxiety Depression negative neurological ROS     GI/Hepatic negative GI ROS, Neg liver ROS,   Endo/Other  diabetes, Type 2, Insulin DependentMorbid obesity  Renal/GU negative Renal ROS     Musculoskeletal negative musculoskeletal ROS (+)   Abdominal   Peds  Hematology  (+) Blood dyscrasia, anemia ,   Anesthesia Other Findings Day of surgery medications reviewed with the patient.  Reproductive/Obstetrics                             Anesthesia Physical Anesthesia Plan  ASA: 4  Anesthesia Plan: MAC   Post-op Pain Management:    Induction: Intravenous  PONV Risk Score and Plan: 1 and Propofol infusion  Airway Management Planned: Natural Airway and Nasal Cannula  Additional Equipment:   Intra-op Plan:   Post-operative Plan:   Informed Consent: I have reviewed the patients History and Physical, chart, labs and discussed the procedure including the risks, benefits and alternatives for the proposed anesthesia with the patient or authorized representative who has indicated his/her understanding and acceptance.     Dental advisory given  Plan Discussed with: CRNA  Anesthesia Plan Comments:          Anesthesia Quick Evaluation

## 2021-09-01 NOTE — Progress Notes (Signed)
ANTICOAGULATION CONSULT NOTE - Initial Consult  Pharmacy Consult for Coumadin and lovenox dosing Indication: VTE prophylaxis s/p BKA in a patient with a mechanical aortic valve replacement (12/05/2019) requiring anticoagulation  Allergies  Allergen Reactions   Pollen Extract Other (See Comments)    Runny nose, watery eyes, sneezing    Patient Measurements: Height: 5\' 10"  (177.8 cm) Weight: (!) 172.4 kg (380 lb 1.2 oz) IBW/kg (Calculated) : 73   Vital Signs: Temp: 98.9 F (37.2 C) (12/06 1157) Temp Source: Oral (12/06 1157) BP: 96/57 (12/06 1157) Pulse Rate: 75 (12/06 1157)  Labs: Recent Labs    08/30/21 0336 08/30/21 0945 08/31/21 0446 08/31/21 1256 09/01/21 0155  HGB 10.7* 11.2*  --   --   --   HCT 33.4* 33.8*  --   --   --   PLT 322 371  --   --   --   LABPROT 17.1*  --  17.3*  --  17.4*  INR 1.4*  --  1.4*  --  1.4*  HEPRLOWMOCWT  --   --   --  0.89  --   CREATININE  --   --  0.64  --   --      Estimated Creatinine Clearance: 186 mL/min (by C-G formula based on SCr of 0.64 mg/dL).   Medical History: Past Medical History:  Diagnosis Date   Anxiety    Aortic stenosis    Cellulitis and abscess of lower extremity 06/11/2019   Cellulitis of fourth toe of left foot    Cholelithiasis    Coronary artery disease    Nonobstructive CAD (40-50% LAD) 08/2019   Depression    Depression    Phreesia 09/27/2020   Depression    Phreesia 11/01/2020   Diabetes mellitus without complication (HCC)    Phreesia 09/27/2020   Elevated troponin level not due myocardial infarction 11/11/2019   Essential hypertension    Gangrene of toe of left foot (HCC) 07/06/2019   Heart murmur    Phreesia 09/27/2020   Hyperlipidemia    Phreesia 09/27/2020   Hypertension    Phreesia 09/27/2020   Mixed hyperlipidemia    Morbid obesity (HCC)    S/P aortic valve replacement with mechanical valve 12/05/2019   25 mm Carbomedics top hat bileaflet mechanical valve via partial upper  hemi-sternotomy   Severe aortic stenosis 09/24/2019   Type 2 diabetes mellitus (HCC)     Medications:  Medications Prior to Admission  Medication Sig Dispense Refill Last Dose   atorvastatin (LIPITOR) 80 MG tablet Take 1 tablet (80 mg total) by mouth daily. 90 tablet 3 Past Week   insulin glargine (LANTUS SOLOSTAR) 100 UNIT/ML Solostar Pen Inject 30 Units into the skin at bedtime. 30 mL 3 Past Week   lisinopril (ZESTRIL) 20 MG tablet Take 1 tablet by mouth once daily 90 tablet 0 08/26/2021   metoprolol tartrate (LOPRESSOR) 25 MG tablet Take 1 tablet by mouth twice daily 180 tablet 0 08/26/2021 at 0800   sertraline (ZOLOFT) 50 MG tablet Take 1 tablet by mouth once daily 90 tablet 1 08/26/2021   sitaGLIPtin-metformin (JANUMET) 50-500 MG tablet Take 1 tablet by mouth 2 (two) times daily with a meal. 180 tablet 3 08/26/2021   warfarin (COUMADIN) 10 MG tablet Take 1 tablet daily. 45 tablet 5 08/25/2021 at 1800    Assessment:  Patient with a history of aortic valve replacement on December 05, 2019 (Carbomedics Top Hat mechanical in position 1).    PTA Coumadin dose 10mg  daily ,  INR was therapeutic on admit. Coumadin held on admission for surgery.   S/p  left BKA 12/2.  Coumadin resumed 08/29/21.  Need for continued anticoagulation s/p BKA with limited mobility d/t recent surgery and a subtherapeutic INR (1.4 on 08/30/21)  requiring overlapping anticoagulation until INR is therapeutic (2.0 - 3.0) given his co-morbidities.  Thus started Lovenox bridge 12/4 until INR > or equal to 2.0. on coumadin.    Lovenox dose is 150 mg sq Q12 hour.  On 08/31/21: anti-Xa level = 0.89 units/ml 4hr post lovenox dose.  Level is therapeutic (goal 0.6-1 units/ml).  Today 09/01/21, INR remains at 1.4  x 3 days after 10mg , 10mg  and 12mg   coumadin doses.   His PTA dose has been maintained on coumadin 10 mg daily for at least 6 months per review of his outpatient anti-coag visit notes in Epic.   CBC:   hgb stable 11.2,  pltc  wnl stable 12/4  No bleeding reported.  I will give a higher dose of coumadin today.   Goal of Therapy:  INR 2-3 Anti-Xa level 0.6-1 units/ml 4hrs after LMWH dose given Monitor platelets by anticoagulation protocol: Yes   Plan:  Coumadin 15mg  po x 1 today  Continue Lovenox 150 mg sq q12h until INR is 2.0 or higher.  Monitor INR daily CBC every 72 hours, next due 09/02/21   , RPh Clinical Pharmacist [phone] 09/01/2021,12:58 PM Please check AMION for all [facility]acy phone numbers After 10:00 PM, call Main Pharmacy [phone]

## 2021-09-01 NOTE — Progress Notes (Signed)
Peripherally Inserted Central Catheter Placement  The IV Nurse has discussed with the patient and/or persons authorized to consent for the patient, the purpose of this procedure and the potential benefits and risks involved with this procedure.  The benefits include less needle sticks, lab draws from the catheter, and the patient may be discharged home with the catheter. Risks include, but not limited to, infection, bleeding, blood clot (thrombus formation), and puncture of an artery; nerve damage and irregular heartbeat and possibility to perform a PICC exchange if needed/ordered by physician.  Alternatives to this procedure were also discussed.  Bard Power PICC patient education guide, fact sheet on infection prevention and patient information card has been provided to patient /or left at bedside.    PICC Placement Documentation  PICC Single Lumen 09/01/21 Right Basilic 43 cm 0 cm (Active)  Indication for Insertion or Continuance of Line Prolonged intravenous therapies 09/01/21 1431  Exposed Catheter (cm) 0 cm 09/01/21 1431  Site Assessment Clean;Dry;Intact 09/01/21 1431  Line Status Flushed;Saline locked;Blood return noted 09/01/21 1431  Dressing Type Transparent 09/01/21 1431  Dressing Status Clean;Dry;Intact 09/01/21 1431  Antimicrobial disc in place? Yes 09/01/21 1431  Dressing Intervention New dressing;Other (Comment) 09/01/21 1431  Dressing Change Due 09/08/21 09/01/21 1431       Reginia Forts Albarece 09/01/2021, 2:33 PM

## 2021-09-01 NOTE — Progress Notes (Signed)
[facility] PROGRESS NOTE    Progress Note  [name]  VOJ:[id number] DOB: 1975/11/17 DOA: 08/27/2021 PCP: Heather Roberts, NP     Brief Narrative:   Adam Long is an 45 y.o. male past medical history significant for diabetes mellitus type 2, mechanical aortic valve on warfarin, hypertension morbid obesity comes in with an infected diabetic foot.  Orthopedic surgery was consulted, who performed transtibial amputation with application of wound VAC.  Blood culture on admission was positive for MSSA cellulitis blood cultures were drawn. Infectious disease was consulted she was transitioned to IV cefazolin they recommended a TEE   Assessment/Plan:   Left Diabetic foot infection/MSSA bacteremia: Continue IV cefazolin.  BC ID was positive for MSSA. Surveillance blood cultures from 08/28/2021 have been negative till date. ID on board and recommended at least 1 month of IV antibiotics.   TEE on 09/01/2021 at 130. Physical therapy and Occupational Therapy evaluated the patient recommended inpatient rehab. Will likely need PICC line  Mechanical aortic valve: INR continues to be subtherapeutic, continue Lovenox and Coumadin INR is 1.4.  Uncontrolled diabetes mellitus type 2 with hyperglycemia: A1c is 7.8. Blood glucose fairly controlled continue current regimen.  Blood glucose mildly increased continue current regimen. He is currently n.p.o. for TEE  Essential  HTN (hypertension) Well-controlled continue current regimen.  Hyperlipidemia: Continue statins.  Morbid obesity: With a BMI of 52 counseling.   DVT prophylaxis: Hepairn Family Communication:none Status is: Inpatient  Remains inpatient appropriate because: Diabetic foot infection requiring amputation        Code Status:     Code Status Orders  (From admission, onward)           Start     Ordered   08/28/21 0254  Full code  Continuous        08/28/21 0253           Code Status History      Date Active Date Inactive Code Status Order ID Comments User Context   02/12/2021 2321 02/17/2021 1958 Full Code [id number]  Frankey Shown, DO Inpatient   12/05/2019 1444 12/12/2019 1546 Full Code [id number]  Sharlene Dory, PA-C Inpatient   11/11/2019 0042 11/11/2019 1714 Full Code [id number]  Starlyn Skeans, MD Inpatient   09/24/2019 0821 09/24/2019 1538 Full Code [id number]  Tonny Bollman, MD Inpatient   07/06/2019 1835 07/10/2019 1432 Full Code [id number]  Sherren Kerns, MD Inpatient   06/11/2019 1707 06/14/2019 1844 Full Code [id number]  Shon Hale, MD Inpatient   07/02/2013 1545 07/03/2013 1535 Full Code [id number]  Esperanza Sheets, MD ED         IV Access:   Peripheral IV   Procedures and diagnostic studies:   No results found.   Medical Consultants:   None.   Subjective:    [name] no complaints this morning, he relates he is hungry.  Objective:    Vitals:   08/31/21 0833 08/31/21 1357 08/31/21 2034 09/01/21 0408  BP: 112/66 96/68 (!) 106/58 (!) 114/59  Pulse: 91 90 83 70  Resp: 18 17 20 20   Temp: 98.4 F (36.9 C) 98.4 F (36.9 C) 99.2 F (37.3 C) 98.4 F (36.9 C)  TempSrc: Oral  Oral   SpO2: 94% 100% 97% 98%  Weight:      Height:       SpO2: 98 % O2 Flow Rate (L/min): 0 L/min FiO2 (%): 21 %   Intake/Output Summary (Last 24 hours) at 09/01/2021 0757 Last data  filed at 09/01/2021 [id number] Gross per 24 hour  Intake 940 ml  Output 1550 ml  Net -610 ml    Filed Weights   08/27/21 0853 08/28/21 1023  Weight: (!) 172.4 kg (!) 172.4 kg    Exam: General exam: In no acute distress. Respiratory system: Good air movement and clear to auscultation. Cardiovascular system: S1 & S2 heard, RRR. No JVD. Gastrointestinal system: Abdomen is nondistended, soft and nontender.  Skin: No rashes, lesions or ulcers Psychiatry: Judgement and insight appear normal. Mood & affect appropriate. Data Reviewed:    Labs: Basic Metabolic Panel: Recent  Labs  Lab 08/27/21 0950 08/28/21 0545 08/31/21 0446  NA 132* 134*  --   K 4.3 4.3  --   CL 97* 101  --   CO2 26 24  --   GLUCOSE 212* 157*  --   BUN 11 9  --   CREATININE 0.72 0.67 0.64  CALCIUM 8.7* 8.7*  --     GFR Estimated Creatinine Clearance: 186 mL/min (by C-G formula based on SCr of 0.64 mg/dL). Liver Function Tests: Recent Labs  Lab 08/27/21 0950  AST 20  ALT 26  ALKPHOS 76  BILITOT 0.7  PROT 7.4  ALBUMIN 2.7*    No results for input(s): LIPASE, AMYLASE in the last 168 hours. No results for input(s): AMMONIA in the last 168 hours. Coagulation profile Recent Labs  Lab 08/28/21 1911 08/29/21 0036 08/30/21 0336 08/31/21 0446 09/01/21 0155  INR 2.3* 2.2* 1.4* 1.4* 1.4*    COVID-19 Labs  No results for input(s): DDIMER, FERRITIN, LDH, CRP in the last 72 hours.   Lab Results  Component Value Date   SARSCOV2NAA NEGATIVE 08/27/2021   [place] NEGATIVE 02/12/2021   [place] NEGATIVE 12/03/2019   [place] NEGATIVE 11/10/2019    CBC: Recent Labs  Lab 08/27/21 0950 08/28/21 0545 08/29/21 0036 08/30/21 0336 08/30/21 0945  WBC 15.3* 11.3* 13.1* 13.4* 12.3*  NEUTROABS 12.7*  --   --   --   --   HGB 10.7* 11.1* 10.4* 10.7* 11.2*  HCT 33.1* 34.3* 31.9* 33.4* 33.8*  MCV 86.9 84.3 83.3 84.3 83.3  PLT 266 282 290 322 371    Cardiac Enzymes: No results for input(s): CKTOTAL, CKMB, CKMBINDEX, TROPONINI in the last 168 hours. BNP (last 3 results) No results for input(s): PROBNP in the last 8760 hours. CBG: Recent Labs  Lab 08/30/21 2123 08/31/21 0811 08/31/21 1255 08/31/21 1715 08/31/21 2033  GLUCAP 141* 151* 150* 128* 176*    D-Dimer: No results for input(s): DDIMER in the last 72 hours. Hgb A1c: No results for input(s): HGBA1C in the last 72 hours.  Lipid Profile: No results for input(s): CHOL, HDL, LDLCALC, TRIG, CHOLHDL, LDLDIRECT in the last 72 hours. Thyroid function studies: No results for input(s): TSH, T4TOTAL, T3FREE,  THYROIDAB in the last 72 hours.  Invalid input(s): FREET3 Anemia work up: No results for input(s): VITAMINB12, FOLATE, FERRITIN, TIBC, IRON, RETICCTPCT in the last 72 hours. Sepsis Labs: Recent Labs  Lab 08/28/21 0545 08/29/21 0036 08/30/21 0336 08/30/21 0945  WBC 11.3* 13.1* 13.4* 12.3*    Microbiology Recent Results (from the past 240 hour(s))  Resp Panel by RT-PCR (Flu A&B, Covid) Nasopharyngeal Swab     Status: None   Collection Time: 08/27/21 10:03 AM   Specimen: Nasopharyngeal Swab; Nasopharyngeal(NP) swabs in vial transport medium  Result Value Ref Range Status   SARS Coronavirus 2 by RT PCR NEGATIVE NEGATIVE Final    Comment: (NOTE) SARS-CoV-2 target nucleic  acids are NOT DETECTED.  The SARS-CoV-2 RNA is generally detectable in upper respiratory specimens during the acute phase of infection. The lowest concentration of SARS-CoV-2 viral copies this assay can detect is 138 copies/mL. A negative result does not preclude SARS-Cov-2 infection and should not be used as the sole basis for treatment or other patient management decisions. A negative result may occur with  improper specimen collection/handling, submission of specimen other than nasopharyngeal swab, presence of viral mutation(s) within the areas targeted by this assay, and inadequate number of viral copies(<138 copies/mL). A negative result must be combined with clinical observations, patient history, and epidemiological information. The expected result is Negative.  Fact Sheet for Patients:  EntrepreneurPulse.com.au  Fact Sheet for Healthcare Providers:  IncredibleEmployment.be  This test is no t yet approved or cleared by the [place] FDA and  has been authorized for detection and/or diagnosis of SARS-CoV-2 by FDA under an Emergency Use Authorization (EUA). This EUA will remain  in effect (meaning this test can be used) for the duration of the COVID-19 declaration  under Section 564(b)(1) of the Act, 21 U.S.C.section 360bbb-3(b)(1), unless the authorization is terminated  or revoked sooner.       Influenza A by PCR NEGATIVE NEGATIVE Final   Influenza B by PCR NEGATIVE NEGATIVE Final    Comment: (NOTE) The Xpert Xpress SARS-CoV-2/FLU/RSV plus assay is intended as an aid in the diagnosis of influenza from Nasopharyngeal swab specimens and should not be used as a sole basis for treatment. Nasal washings and aspirates are unacceptable for Xpert Xpress SARS-CoV-2/FLU/RSV testing.  Fact Sheet for Patients: EntrepreneurPulse.com.au  Fact Sheet for Healthcare Providers: IncredibleEmployment.be  This test is not yet approved or cleared by the [place] FDA and has been authorized for detection and/or diagnosis of SARS-CoV-2 by FDA under an Emergency Use Authorization (EUA). This EUA will remain in effect (meaning this test can be used) for the duration of the COVID-19 declaration under Section 564(b)(1) of the Act, 21 U.S.C. section 360bbb-3(b)(1), unless the authorization is terminated or revoked.  Performed at [facility], [address] [place], [place] [address]   Culture, blood (routine x 2)     Status: None   Collection Time: 08/27/21 10:28 AM   Specimen: Left Antecubital; Blood  Result Value Ref Range Status   Specimen Description LEFT ANTECUBITAL  Final   Special Requests   Final    BOTTLES DRAWN AEROBIC AND ANAEROBIC Blood Culture adequate volume   Culture   Final    NO GROWTH 5 DAYS Performed at [facility], [address], [place], [place] [address]    Report Status 09/01/2021 FINAL  Final  Culture, blood (routine x 2)     Status: Abnormal   Collection Time: 08/27/21 10:40 AM   Specimen: Right Antecubital; Blood  Result Value Ref Range Status   Specimen Description   Final    RIGHT ANTECUBITAL Performed at [facility], [address] [place], [place] [address]    Special Requests    Final    BOTTLES DRAWN AEROBIC AND ANAEROBIC Blood Culture results may not be optimal due to an excessive volume of blood received in culture bottles Performed at [facility], [address], [place], [place] [address]    Culture  Setup Time   Final    GRAM POSITIVE COCCI IN BOTH AEROBIC AND ANAEROBIC BOTTLES Gram Stain Report Called to,Read Back By and Verified With: [email or web address]  by Zigmund Daniel, b 12.2.22 CRITICAL RESULT CALLED TO, READ  BACK BY AND VERIFIED WITH: Cathlyn Parsons [id number] AT 1306 BY CM Performed at [facility], [place] [address] [place], [place] [address]    Culture STAPHYLOCOCCUS AUREUS STREPTOCOCCUS AGALACTIAE  (A)  Final   Report Status 08/31/2021 FINAL  Final   Organism ID, Bacteria STAPHYLOCOCCUS AUREUS  Final   Organism ID, Bacteria STREPTOCOCCUS AGALACTIAE  Final      Susceptibility   Streptococcus agalactiae - MIC*    CLINDAMYCIN <=0.25 SENSITIVE Sensitive     AMPICILLIN <=0.25 SENSITIVE Sensitive     ERYTHROMYCIN <=0.12 SENSITIVE Sensitive     VANCOMYCIN 0.5 SENSITIVE Sensitive     CEFTRIAXONE <=0.12 SENSITIVE Sensitive     LEVOFLOXACIN 1 SENSITIVE Sensitive     * STREPTOCOCCUS AGALACTIAE   Staphylococcus aureus - MIC*    CIPROFLOXACIN <=0.5 SENSITIVE Sensitive     ERYTHROMYCIN <=0.25 SENSITIVE Sensitive     GENTAMICIN <=0.5 SENSITIVE Sensitive     OXACILLIN 0.5 SENSITIVE Sensitive     TETRACYCLINE <=1 SENSITIVE Sensitive     VANCOMYCIN 1 SENSITIVE Sensitive     TRIMETH/SULFA <=10 SENSITIVE Sensitive     CLINDAMYCIN <=0.25 SENSITIVE Sensitive     RIFAMPIN <=0.5 SENSITIVE Sensitive     Inducible Clindamycin NEGATIVE Sensitive     * STAPHYLOCOCCUS AUREUS  Blood Culture ID Panel (Reflexed)     Status: Abnormal   Collection Time: 08/27/21 10:40 AM  Result Value Ref Range Status   Enterococcus faecalis NOT DETECTED NOT DETECTED Final   Enterococcus Faecium NOT DETECTED NOT DETECTED Final   Listeria monocytogenes NOT DETECTED NOT DETECTED  Final   Staphylococcus species DETECTED (A) NOT DETECTED Final    Comment: CRITICAL RESULT CALLED TO, READ BACK BY AND VERIFIED WITH: PHARMD E MARTIN [id number] AT 1306 BY CM    Staphylococcus aureus (BCID) DETECTED (A) NOT DETECTED Final    Comment: CRITICAL RESULT CALLED TO, READ BACK BY AND VERIFIED WITH: PHARMD E MARTIN [id number] AT 1306 BY CM    Staphylococcus epidermidis NOT DETECTED NOT DETECTED Final   Staphylococcus lugdunensis NOT DETECTED NOT DETECTED Final   Streptococcus species NOT DETECTED NOT DETECTED Final   Streptococcus agalactiae NOT DETECTED NOT DETECTED Final   Streptococcus pneumoniae NOT DETECTED NOT DETECTED Final   Streptococcus pyogenes NOT DETECTED NOT DETECTED Final   A.calcoaceticus-baumannii NOT DETECTED NOT DETECTED Final   Bacteroides fragilis NOT DETECTED NOT DETECTED Final   Enterobacterales NOT DETECTED NOT DETECTED Final   Enterobacter cloacae complex NOT DETECTED NOT DETECTED Final   Escherichia coli NOT DETECTED NOT DETECTED Final   Klebsiella aerogenes NOT DETECTED NOT DETECTED Final   Klebsiella oxytoca NOT DETECTED NOT DETECTED Final   Klebsiella pneumoniae NOT DETECTED NOT DETECTED Final   Proteus species NOT DETECTED NOT DETECTED Final   Salmonella species NOT DETECTED NOT DETECTED Final   Serratia marcescens NOT DETECTED NOT DETECTED Final   Haemophilus influenzae NOT DETECTED NOT DETECTED Final   Neisseria meningitidis NOT DETECTED NOT DETECTED Final   Pseudomonas aeruginosa NOT DETECTED NOT DETECTED Final   Stenotrophomonas maltophilia NOT DETECTED NOT DETECTED Final   Candida albicans NOT DETECTED NOT DETECTED Final   Candida auris NOT DETECTED NOT DETECTED Final   Candida glabrata NOT DETECTED NOT DETECTED Final   Candida krusei NOT DETECTED NOT DETECTED Final   Candida parapsilosis NOT DETECTED NOT DETECTED Final   Candida tropicalis NOT DETECTED NOT DETECTED Final   Cryptococcus neoformans/gattii NOT DETECTED NOT DETECTED Final   Meth  resistant mecA/C and MREJ NOT  DETECTED NOT DETECTED Final    Comment: Performed at [facility], [place] [address], [place], [place] [address]  Surgical PCR screen     Status: Abnormal   Collection Time: 08/28/21  9:41 AM   Specimen: Nasal Mucosa; Nasal Swab  Result Value Ref Range Status   MRSA, PCR NEGATIVE NEGATIVE Final   Staphylococcus aureus POSITIVE (A) NEGATIVE Final    Comment: (NOTE) The Xpert SA Assay (FDA approved for NASAL specimens in patients 71 years of age and older), is one component of a comprehensive surveillance program. It is not intended to diagnose infection nor to guide or monitor treatment. Performed at [facility], [place] [address], [place], [place] [address]   Culture, blood (Routine X 2) w Reflex to ID Panel     Status: None (Preliminary result)   Collection Time: 08/28/21  3:31 PM   Specimen: BLOOD  Result Value Ref Range Status   Specimen Description BLOOD RIGHT ARM  Final   Special Requests   Final    BOTTLES DRAWN AEROBIC AND ANAEROBIC Blood Culture adequate volume   Culture   Final    NO GROWTH 3 DAYS Performed at [facility], [place] [address], [place], [place] [address]    Report Status PENDING  Incomplete  Culture, blood (Routine X 2) w Reflex to ID Panel     Status: None (Preliminary result)   Collection Time: 08/28/21  3:34 PM   Specimen: BLOOD  Result Value Ref Range Status   Specimen Description BLOOD LEFT HAND  Final   Special Requests   Final    BOTTLES DRAWN AEROBIC ONLY Blood Culture adequate volume   Culture   Final    NO GROWTH 3 DAYS Performed at [facility], [place] [address], [place], [place] [address]    Report Status PENDING  Incomplete     Medications:    vitamin C  1,000 mg Oral Daily   atorvastatin  80 mg Oral Daily   docusate sodium  100 mg Oral Daily   enoxaparin (LOVENOX) injection  150 mg Subcutaneous Q12H   insulin aspart  0-15 Units Subcutaneous TID WC   insulin aspart  0-5 Units  Subcutaneous QHS   insulin glargine-yfgn  15 Units Subcutaneous Daily   lisinopril  10 mg Oral Daily   metoprolol tartrate  25 mg Oral BID   mupirocin ointment  1 application Nasal BID   nutrition supplement (JUVEN)  1 packet Oral BID BM   pantoprazole  40 mg Oral Daily   sertraline  50 mg Oral Daily   warfarin  12 mg Oral q1600   Warfarin - Pharmacist Dosing Inpatient   Does not apply q1600   zinc sulfate  220 mg Oral Daily   Continuous Infusions:  sodium chloride Stopped (08/29/21 0535)   sodium chloride 20 mL/hr at 08/31/21 1817    ceFAZolin (ANCEF) IV 2 g (09/01/21 0305)   magnesium sulfate bolus IVPB        LOS: 5 days   Charlynne Cousins  [facility]  09/01/2021, 7:57 AM

## 2021-09-01 NOTE — Progress Notes (Signed)
Spoke with Theresa Duty. Patient is waiting for team to transfer him for a TEE at this time. Will try to place PICC today when patient available.

## 2021-09-01 NOTE — Progress Notes (Signed)
Pharmacy Antibiotic Note  Adam Long is a 45 y.o. male admitted on 08/27/2021 with MSSA bacteremia related to L-foot infection.  S/p left BKA on 08/28/21. Pharmacy was consulted on 08/28/21 for Cefazolin dosing.  Afebrile  WBC 15.3>>13.4>12.3k TEE 12/6: no vegetation seen    Plan: -Continue Cefazolin 2g IV every 8 hours - Will continue to follow renal function, culture results, LOT.   Height: 5\' 10"  (177.8 cm) Weight: (!) 172.4 kg (380 lb 1.2 oz) IBW/kg (Calculated) : 73  Temp (24hrs), Avg:98.6 F (37 C), Min:98 F (36.7 C), Max:99.2 F (37.3 C)  Recent Labs  Lab 08/27/21 0950 08/28/21 0545 08/29/21 0036 08/30/21 0336 08/30/21 0945 08/31/21 0446  WBC 15.3* 11.3* 13.1* 13.4* 12.3*  --   CREATININE 0.72 0.67  --   --   --  0.64     Estimated Creatinine Clearance: 186 mL/min (by C-G formula based on SCr of 0.64 mg/dL).    Allergies  Allergen Reactions   Pollen Extract Other (See Comments)    Runny nose, watery eyes, sneezing    Antimicrobials this admission: Vanc 12/1 >> 12/2 CTX 12/1 x 1 Cefepime 12/1 >> 12/2 Cefazolin 12/2 >>  Dose adjustments this admission:   Microbiology results: 12/1 COVID/Flu >> neg 12/1 BCx: >  Staph aureus- pan sensitive;   AND Strep agalactiae: pan sens 12/2 Bcx: ngtd x4d 12/2 MRSA PCR negative;  SA : positive   Thank you for allowing pharmacy to be a part of this patient's care.  14/2, RPh Clinical Pharmacist [phone] 09/01/2021 1:04 PM  **Pharmacist phone directory can now be found on [email or web address] ([email or web address]).  Listed under [facility].

## 2021-09-01 NOTE — Progress Notes (Signed)
[place] for Infectious Disease  Date of Admission:  08/27/2021           Reason for visit: Follow up on MSSA bacteremia  Current antibiotics: Cefazolin 12/2---present    ASSESSMENT:    45 y.o. male admitted with:  MSSA and GBS bacteremia:  secondary to diabetic foot infection and s/p left BKA on 08/28/21.  Repeated cultures 08/28/21 are NGTD. Mechanical aortic valve: on chronic warfarin. DM type 2: A1c is 7.8 Sepsis: due to #1 and resolved Obesity  RECOMMENDATIONS:    Continue cefazolin 2gm q8h Okay to place PICC and will order TEE today to ensure no PVE Wound care, glycemic control Will follow   Principal Problem:   Diabetic foot infection ([place]) Active Problems:   HTN (hypertension)   Morbid obesity ([place])   S/P AVR (aortic valve replacement)   Severe protein-calorie malnutrition (HCC)   MSSA bacteremia    MEDICATIONS:    Scheduled Meds: . vitamin C  1,000 mg Oral Daily  . atorvastatin  80 mg Oral Daily  . docusate sodium  100 mg Oral Daily  . enoxaparin (LOVENOX) injection  150 mg Subcutaneous Q12H  . insulin aspart  0-15 Units Subcutaneous TID WC  . insulin aspart  0-5 Units Subcutaneous QHS  . insulin glargine-yfgn  15 Units Subcutaneous Daily  . lisinopril  10 mg Oral Daily  . metoprolol tartrate  25 mg Oral BID  . mupirocin ointment  1 application Nasal BID  . nutrition supplement (JUVEN)  1 packet Oral BID BM  . pantoprazole  40 mg Oral Daily  . sertraline  50 mg Oral Daily  . warfarin  12 mg Oral q1600  . Warfarin - Pharmacist Dosing Inpatient   Does not apply q1600  . zinc sulfate  220 mg Oral Daily   Continuous Infusions: . sodium chloride Stopped (08/29/21 0535)  . sodium chloride 20 mL/hr at 08/31/21 1817  .  ceFAZolin (ANCEF) IV 2 g (09/01/21 0305)  . magnesium sulfate bolus IVPB     PRN Meds:.[DISCONTINUED] acetaminophen **OR** acetaminophen, acetaminophen, alum & mag hydroxide-simeth, bisacodyl,  guaiFENesin-dextromethorphan, hydrALAZINE, HYDROmorphone (DILAUDID) injection, labetalol, magnesium citrate, magnesium sulfate bolus IVPB, metoprolol tartrate, ondansetron **OR** ondansetron (ZOFRAN) IV, oxyCODONE, oxyCODONE, phenol, polyethylene glycol, potassium chloride  SUBJECTIVE:   24 hour events:  No acute events noted Afebrile Tmax 99.2 No new labs other than PT/INR No new imaging 12/2 cultures NGTD  No new complaints.  Waiting for TEE today.  No fevers, chills.  Tolerating abx.  Pain adequately controlled.   Review of Systems  All other systems reviewed and are negative.    OBJECTIVE:   Blood pressure (!) 114/59, pulse 70, temperature 98.4 F (36.9 C), resp. rate 20, height 5\' 10"  (1.778 m), weight (!) 172.4 kg, SpO2 98 %. Body mass index is 54.53 kg/m.  Physical Exam Constitutional:      General: He is not in acute distress.    Appearance: Normal appearance. He is obese.  HENT:     Head: Normocephalic and atraumatic.  Eyes:     Extraocular Movements: Extraocular movements intact.     Conjunctiva/sclera: Conjunctivae normal.  Pulmonary:     Effort: Pulmonary effort is normal. No respiratory distress.  Abdominal:     General: There is no distension.     Palpations: Abdomen is soft.     Tenderness: There is no abdominal tenderness.  Musculoskeletal:     Comments: S/p left BKA.  Neurological:  General: No focal deficit present.     Mental Status: He is alert and oriented to person, place, and time.  Psychiatric:        Mood and Affect: Mood normal.        Behavior: Behavior normal.     Lab Results: Lab Results  Component Value Date   WBC 12.3 (H) 08/30/2021   HGB 11.2 (L) 08/30/2021   HCT 33.8 (L) 08/30/2021   MCV 83.3 08/30/2021   PLT 371 08/30/2021    Lab Results  Component Value Date   NA 134 (L) 08/28/2021   K 4.3 08/28/2021   CO2 24 08/28/2021   GLUCOSE 157 (H) 08/28/2021   BUN 9 08/28/2021   CREATININE 0.64 08/31/2021   CALCIUM 8.7 (L)  08/28/2021   GFRNONAA >60 08/31/2021   GFRAA 126 10/31/2020    Lab Results  Component Value Date   ALT 26 08/27/2021   AST 20 08/27/2021   ALKPHOS 76 08/27/2021   BILITOT 0.7 08/27/2021       Component Value Date/Time   CRP 12.8 (H) 08/29/2021 0036       Component Value Date/Time   ESRSEDRATE 104 (H) 08/29/2021 0036     I have reviewed the micro and lab results in Epic.  Imaging: No results found.   Imaging independently reviewed in Epic.    Vedia Coffer for Infectious Disease [facility] [phone] pager 09/01/2021, 8:11 AM  I spent greater than 35 minutes with the patient including greater than 50% of time in face to face counsel of the patient and in coordination of their care.

## 2021-09-02 ENCOUNTER — Other Ambulatory Visit: Payer: Self-pay

## 2021-09-02 ENCOUNTER — Encounter (HOSPITAL_COMMUNITY): Payer: Self-pay | Admitting: Physical Medicine and Rehabilitation

## 2021-09-02 ENCOUNTER — Inpatient Hospital Stay (HOSPITAL_COMMUNITY)
Admission: RE | Admit: 2021-09-02 | Discharge: 2021-09-14 | DRG: 559 | Disposition: A | Discharge status: Home / Self Care | Payer: 59 | Source: Intra-hospital | Attending: Physical Medicine and Rehabilitation | Admitting: Physical Medicine and Rehabilitation

## 2021-09-02 DIAGNOSIS — G546 Phantom limb syndrome with pain: Secondary | ICD-10-CM | POA: Diagnosis present

## 2021-09-02 DIAGNOSIS — Z79899 Other long term (current) drug therapy: Secondary | ICD-10-CM | POA: Diagnosis not present

## 2021-09-02 DIAGNOSIS — I10 Essential (primary) hypertension: Secondary | ICD-10-CM | POA: Diagnosis present

## 2021-09-02 DIAGNOSIS — Z808 Family history of malignant neoplasm of other organs or systems: Secondary | ICD-10-CM | POA: Diagnosis not present

## 2021-09-02 DIAGNOSIS — E46 Unspecified protein-calorie malnutrition: Secondary | ICD-10-CM | POA: Diagnosis present

## 2021-09-02 DIAGNOSIS — F419 Anxiety disorder, unspecified: Secondary | ICD-10-CM

## 2021-09-02 DIAGNOSIS — Z6841 Body Mass Index (BMI) 40.0 and over, adult: Secondary | ICD-10-CM | POA: Diagnosis not present

## 2021-09-02 DIAGNOSIS — Z8249 Family history of ischemic heart disease and other diseases of the circulatory system: Secondary | ICD-10-CM

## 2021-09-02 DIAGNOSIS — I2510 Atherosclerotic heart disease of native coronary artery without angina pectoris: Secondary | ICD-10-CM | POA: Diagnosis present

## 2021-09-02 DIAGNOSIS — Z794 Long term (current) use of insulin: Secondary | ICD-10-CM | POA: Diagnosis not present

## 2021-09-02 DIAGNOSIS — B9561 Methicillin susceptible Staphylococcus aureus infection as the cause of diseases classified elsewhere: Secondary | ICD-10-CM

## 2021-09-02 DIAGNOSIS — Z7902 Long term (current) use of antithrombotics/antiplatelets: Secondary | ICD-10-CM | POA: Diagnosis not present

## 2021-09-02 DIAGNOSIS — Z952 Presence of prosthetic heart valve: Secondary | ICD-10-CM

## 2021-09-02 DIAGNOSIS — E871 Hypo-osmolality and hyponatremia: Secondary | ICD-10-CM | POA: Diagnosis present

## 2021-09-02 DIAGNOSIS — Z823 Family history of stroke: Secondary | ICD-10-CM | POA: Diagnosis not present

## 2021-09-02 DIAGNOSIS — E43 Unspecified severe protein-calorie malnutrition: Secondary | ICD-10-CM | POA: Diagnosis present

## 2021-09-02 DIAGNOSIS — R7881 Bacteremia: Secondary | ICD-10-CM | POA: Diagnosis present

## 2021-09-02 DIAGNOSIS — S88112A Complete traumatic amputation at level between knee and ankle, left lower leg, initial encounter: Secondary | ICD-10-CM | POA: Diagnosis not present

## 2021-09-02 DIAGNOSIS — Z83438 Family history of other disorder of lipoprotein metabolism and other lipidemia: Secondary | ICD-10-CM

## 2021-09-02 DIAGNOSIS — Z4781 Encounter for orthopedic aftercare following surgical amputation: Principal | ICD-10-CM

## 2021-09-02 DIAGNOSIS — Z89512 Acquired absence of left leg below knee: Secondary | ICD-10-CM

## 2021-09-02 DIAGNOSIS — E782 Mixed hyperlipidemia: Secondary | ICD-10-CM | POA: Diagnosis present

## 2021-09-02 DIAGNOSIS — L089 Local infection of the skin and subcutaneous tissue, unspecified: Secondary | ICD-10-CM | POA: Diagnosis not present

## 2021-09-02 DIAGNOSIS — E11628 Type 2 diabetes mellitus with other skin complications: Secondary | ICD-10-CM | POA: Diagnosis not present

## 2021-09-02 DIAGNOSIS — E1151 Type 2 diabetes mellitus with diabetic peripheral angiopathy without gangrene: Secondary | ICD-10-CM | POA: Diagnosis present

## 2021-09-02 DIAGNOSIS — E66813 Obesity, class 3: Secondary | ICD-10-CM | POA: Diagnosis present

## 2021-09-02 DIAGNOSIS — A4101 Sepsis due to Methicillin susceptible Staphylococcus aureus: Secondary | ICD-10-CM | POA: Diagnosis present

## 2021-09-02 DIAGNOSIS — D649 Anemia, unspecified: Secondary | ICD-10-CM | POA: Diagnosis present

## 2021-09-02 LAB — CBC
HCT: 36.2 % — ABNORMAL LOW (ref 39.0–52.0)
Hemoglobin: 11.4 g/dL — ABNORMAL LOW (ref 13.0–17.0)
MCH: 26.8 pg (ref 26.0–34.0)
MCHC: 31.5 g/dL (ref 30.0–36.0)
MCV: 85 fL (ref 80.0–100.0)
Platelets: 407 10*3/uL — ABNORMAL HIGH (ref 150–400)
RBC: 4.26 MIL/uL (ref 4.22–5.81)
RDW: 15.5 % (ref 11.5–15.5)
WBC: 14.3 10*3/uL — ABNORMAL HIGH (ref 4.0–10.5)
nRBC: 0 % (ref 0.0–0.2)

## 2021-09-02 LAB — PROTIME-INR
INR: 1.6 — ABNORMAL HIGH (ref 0.8–1.2)
Prothrombin Time: 18.8 seconds — ABNORMAL HIGH (ref 11.4–15.2)

## 2021-09-02 LAB — CULTURE, BLOOD (ROUTINE X 2)
Culture: NO GROWTH
Culture: NO GROWTH
Special Requests: ADEQUATE
Special Requests: ADEQUATE

## 2021-09-02 LAB — GLUCOSE, CAPILLARY
Glucose-Capillary: 142 mg/dL — ABNORMAL HIGH (ref 70–99)
Glucose-Capillary: 139 mg/dL — ABNORMAL HIGH (ref 70–99)
Glucose-Capillary: 139 mg/dL — ABNORMAL HIGH (ref 70–99)
Glucose-Capillary: 122 mg/dL — ABNORMAL HIGH (ref 70–99)

## 2021-09-02 LAB — CREATININE, SERUM
Creatinine, Ser: 0.65 mg/dL (ref 0.61–1.24)
GFR, Estimated: >60 mL/min (ref >60)

## 2021-09-02 MED ORDER — ALUM & MAG HYDROXIDE-SIMETH 200-200-20 MG/5ML PO SUSP: 30.0000 mL | ORAL | Status: DC | PRN

## 2021-09-02 MED ORDER — WARFARIN SODIUM 2.5 MG PO TABS: 12.5000 mg | ORAL_TABLET | Freq: Once | ORAL | Status: DC

## 2021-09-02 MED ORDER — SERTRALINE HCL 50 MG PO TABS
50.0000 mg | ORAL_TABLET | Freq: Every day | ORAL | Status: DC
  Administered 2021-09-03 – 2021-09-14 (×12): 50 mg via ORAL
  Filled 2021-09-02 (×12): qty 1

## 2021-09-02 MED ORDER — TRAZODONE HCL 50 MG PO TABS: 25.0000 mg | ORAL_TABLET | Freq: Every evening | ORAL | Status: DC | PRN

## 2021-09-02 MED ORDER — PROSOURCE PLUS PO LIQD
30.0000 mL | Freq: Two times a day (BID) | ORAL | Status: DC
  Administered 2021-09-03 – 2021-09-14 (×22): 30 mL via ORAL
  Filled 2021-09-02 (×19): qty 30

## 2021-09-02 MED ORDER — INSULIN ASPART 100 UNIT/ML IJ SOLN
0.0000 [IU] | Freq: Three times a day (TID) | INTRAMUSCULAR | Status: DC
  Administered 2021-09-02: 1 [IU] via SUBCUTANEOUS
  Administered 2021-09-03 (×2): 2 [IU] via SUBCUTANEOUS
  Administered 2021-09-03 – 2021-09-05 (×6): 1 [IU] via SUBCUTANEOUS
  Administered 2021-09-06: 2 [IU] via SUBCUTANEOUS
  Administered 2021-09-06 – 2021-09-07 (×2): 1 [IU] via SUBCUTANEOUS
  Administered 2021-09-07: 2 [IU] via SUBCUTANEOUS
  Administered 2021-09-07 – 2021-09-08 (×2): 1 [IU] via SUBCUTANEOUS
  Administered 2021-09-08: 2 [IU] via SUBCUTANEOUS
  Administered 2021-09-09 – 2021-09-14 (×14): 1 [IU] via SUBCUTANEOUS

## 2021-09-02 MED ORDER — BISACODYL 10 MG RE SUPP: 10.0000 mg | Freq: Every day | RECTAL | Status: DC | PRN

## 2021-09-02 MED ORDER — PROCHLORPERAZINE 25 MG RE SUPP: 12.5000 mg | Freq: Four times a day (QID) | RECTAL | Status: DC | PRN

## 2021-09-02 MED ORDER — ENOXAPARIN SODIUM 150 MG/ML IJ SOSY
150.0000 mg | PREFILLED_SYRINGE | Freq: Two times a day (BID) | INTRAMUSCULAR | Status: DC
  Administered 2021-09-02 – 2021-09-10 (×16): 150 mg via SUBCUTANEOUS
  Filled 2021-09-02 (×18): qty 1

## 2021-09-02 MED ORDER — GUAIFENESIN-DM 100-10 MG/5ML PO SYRP: 5.0000 mL | ORAL_SOLUTION | Freq: Four times a day (QID) | ORAL | Status: DC | PRN

## 2021-09-02 MED ORDER — WARFARIN SODIUM 7.5 MG PO TABS
12.5000 mg | ORAL_TABLET | Freq: Once | ORAL | Status: DC
  Administered 2021-09-02: 12.5 mg via ORAL
  Filled 2021-09-02: qty 1

## 2021-09-02 MED ORDER — LISINOPRIL 10 MG PO TABS
10.0000 mg | ORAL_TABLET | Freq: Every day | ORAL | Status: DC
  Administered 2021-09-03 – 2021-09-14 (×12): 10 mg via ORAL
  Filled 2021-09-02 (×12): qty 1

## 2021-09-02 MED ORDER — ACETAMINOPHEN 325 MG PO TABS: 325.0000 mg | ORAL_TABLET | ORAL | Status: DC | PRN

## 2021-09-02 MED ORDER — WARFARIN SODIUM 7.5 MG PO TABS: 12.5000 mg | ORAL_TABLET | Freq: Once | ORAL | Status: DC

## 2021-09-02 MED ORDER — METFORMIN HCL 500 MG PO TABS
250.0000 mg | ORAL_TABLET | Freq: Every day | ORAL | Status: DC
  Administered 2021-09-03 – 2021-09-14 (×12): 250 mg via ORAL
  Filled 2021-09-02 (×12): qty 1

## 2021-09-02 MED ORDER — WARFARIN - PHARMACIST DOSING INPATIENT: Freq: Every day | Status: DC

## 2021-09-02 MED ORDER — ENSURE MAX PROTEIN PO LIQD
11.0000 [oz_av] | Freq: Two times a day (BID) | ORAL | Status: DC
  Administered 2021-09-02 – 2021-09-09 (×11): 11 [oz_av] via ORAL

## 2021-09-02 MED ORDER — INSULIN ASPART 100 UNIT/ML IJ SOLN: 0.0000 [IU] | Freq: Every day | INTRAMUSCULAR | Status: DC

## 2021-09-02 MED ORDER — WARFARIN SODIUM 5 MG PO TABS: 10.0000 mg | ORAL_TABLET | Freq: Once | ORAL | Status: DC

## 2021-09-02 MED ORDER — PANTOPRAZOLE SODIUM 40 MG PO TBEC
40.0000 mg | DELAYED_RELEASE_TABLET | Freq: Every day | ORAL | Status: DC
Start: 2021-09-03 — End: 2021-09-14

## 2021-09-02 MED ORDER — OXYCODONE HCL 5 MG PO TABS
10.0000 mg | ORAL_TABLET | ORAL | Status: DC | PRN
  Administered 2021-09-02 – 2021-09-07 (×16): 15 mg via ORAL
  Administered 2021-09-08: 10 mg via ORAL
  Administered 2021-09-08 (×3): 15 mg via ORAL
  Administered 2021-09-09: 09:00:00 10 mg via ORAL
  Administered 2021-09-09 (×2): 15 mg via ORAL
  Administered 2021-09-09 – 2021-09-10 (×2): 10 mg via ORAL
  Administered 2021-09-11: 15 mg via ORAL
  Filled 2021-09-02 (×13): qty 3
  Filled 2021-09-02: qty 2
  Filled 2021-09-02 (×6): qty 3
  Filled 2021-09-02: qty 2
  Filled 2021-09-02 (×2): qty 3
  Filled 2021-09-02: qty 2
  Filled 2021-09-02 (×3): qty 3

## 2021-09-02 MED ORDER — WARFARIN SODIUM 7.5 MG PO TABS: 15.0000 mg | ORAL_TABLET | Freq: Every day | ORAL | Status: DC

## 2021-09-02 MED ORDER — CEFAZOLIN SODIUM-DEXTROSE 2-4 GM/100ML-% IV SOLN
2.0000 g | Freq: Three times a day (TID) | INTRAVENOUS | Status: DC
  Administered 2021-09-02 – 2021-09-14 (×35): 2 g via INTRAVENOUS
  Filled 2021-09-02 (×38): qty 100

## 2021-09-02 MED ORDER — PROCHLORPERAZINE MALEATE 5 MG PO TABS: 5.0000 mg | ORAL_TABLET | Freq: Four times a day (QID) | ORAL | Status: DC | PRN

## 2021-09-02 MED ORDER — METOPROLOL TARTRATE 25 MG PO TABS
25.0000 mg | ORAL_TABLET | Freq: Two times a day (BID) | ORAL | Status: DC
  Administered 2021-09-02 – 2021-09-14 (×24): 25 mg via ORAL
  Filled 2021-09-02 (×24): qty 1

## 2021-09-02 MED ORDER — PROCHLORPERAZINE EDISYLATE 10 MG/2ML IJ SOLN
5.0000 mg | Freq: Four times a day (QID) | INTRAMUSCULAR | Status: DC | PRN
Start: 2021-09-02 — End: 2021-09-14

## 2021-09-02 MED ORDER — DIPHENHYDRAMINE HCL 12.5 MG/5ML PO ELIX: 12.5000 mg | ORAL_SOLUTION | Freq: Four times a day (QID) | ORAL | Status: DC | PRN

## 2021-09-02 MED ORDER — CEFAZOLIN IV (FOR PTA / DISCHARGE USE ONLY): 2.0000 g | Freq: Three times a day (TID) | INTRAVENOUS | Status: DC

## 2021-09-02 MED ORDER — POLYETHYLENE GLYCOL 3350 17 G PO PACK
17.0000 g | PACK | Freq: Every day | ORAL | Status: DC | PRN
  Administered 2021-09-10: 17 g via ORAL
  Filled 2021-09-02: qty 1

## 2021-09-02 MED ORDER — PANTOPRAZOLE SODIUM 40 MG PO TBEC
40.0000 mg | DELAYED_RELEASE_TABLET | Freq: Every day | ORAL | Status: DC
  Administered 2021-09-03 – 2021-09-14 (×12): 40 mg via ORAL
  Filled 2021-09-02 (×12): qty 1

## 2021-09-02 MED ORDER — ATORVASTATIN CALCIUM 80 MG PO TABS
80.0000 mg | ORAL_TABLET | Freq: Every day | ORAL | Status: DC
  Administered 2021-09-03 – 2021-09-14 (×12): 80 mg via ORAL
  Filled 2021-09-02 (×13): qty 1

## 2021-09-02 MED ORDER — OXYCODONE HCL 10 MG PO TABS
10.0000 mg | ORAL_TABLET | ORAL | 0 refills | Status: DC | PRN
Start: 2021-09-02 — End: 2021-09-14

## 2021-09-02 MED ORDER — FLEET ENEMA 7-19 GM/118ML RE ENEM: 1.0000 | ENEMA | Freq: Once | RECTAL | Status: DC | PRN

## 2021-09-02 MED ORDER — ENOXAPARIN SODIUM 150 MG/ML IJ SOSY: 150.0000 mg | PREFILLED_SYRINGE | Freq: Two times a day (BID) | INTRAMUSCULAR | Status: DC

## 2021-09-02 MED ORDER — LISINOPRIL 10 MG PO TABS: 10.0000 mg | ORAL_TABLET | Freq: Every day | ORAL | Status: DC

## 2021-09-02 MED ORDER — CEFAZOLIN IV (FOR PTA / DISCHARGE USE ONLY): 2.0000 g | Freq: Three times a day (TID) | INTRAVENOUS | 0 refills | Status: DC

## 2021-09-02 NOTE — Anesthesia Postprocedure Evaluation (Signed)
Anesthesia Post Note  Patient: Adam Long  Procedure(s) Performed: TRANSESOPHAGEAL ECHOCARDIOGRAM (TEE)     Patient location during evaluation: Endoscopy Anesthesia Type: MAC Level of consciousness: awake and alert Pain management: pain level controlled Vital Signs Assessment: post-procedure vital signs reviewed and stable Respiratory status: spontaneous breathing, nonlabored ventilation, respiratory function stable and patient connected to nasal cannula oxygen Cardiovascular status: stable and blood pressure returned to baseline Postop Assessment: no apparent nausea or vomiting Anesthetic complications: no   No notable events documented.  Last Vitals:  Vitals:   09/01/21 2126 09/02/21 0735  BP: 112/73 106/69  Pulse: (!) 110 80  Resp: 18 18  Temp: 36.8 C 36.8 C  SpO2: 97% 96%    Last Pain:  Vitals:   09/02/21 1029  TempSrc:   PainSc: 3                  [name]

## 2021-09-02 NOTE — Progress Notes (Signed)
[place] for Infectious Disease  Date of Admission:  08/27/2021           Reason for visit: Follow up on bacteremia  Current antibiotics: Cefazolin  ASSESSMENT:    45 y.o. male admitted with:  MSSA and GBS bacteremia: secondary to diabetic foot infection and s/p left BKA on 08/28/21.  Repeat cultures negative from 12/2 as well. Mechanical aortic valve: TEE negative for vegetation DM type 2: A1c is 7.8 Sepsis due to #1: resolved Obesity  RECOMMENDATIONS:    Continue ancef 2gm q8h See OPAT note.  Will sign off.  Diagnosis: MSSA/GBS bacteremia  Culture Result: MSSA and GBS  Allergies  Allergen Reactions  . Pollen Extract Other (See Comments)    Runny nose, watery eyes, sneezing    OPAT Orders Discharge antibiotics to be given via PICC line Discharge antibiotics: Per pharmacy protocol  Cefazolin 2gm q8h  Duration: 4 weeks End Date: 09/24/21  [facility] Care Per Protocol:  Home health RN for IV administration and teaching; PICC line care and labs.    Labs weekly while on IV antibiotics: _xx_ CBC with differential __xx BMP __ CMP __ CRP __ ESR __ Vancomycin trough __ CK  _xx_ Please pull PIC at completion of IV antibiotics __ Please leave PIC in place until doctor has seen patient or been notified  Fax weekly labs to [phone]  Clinic Follow Up Appt: 10/02/2021 at 28 am with Dr Juleen China      Principal Problem:   Diabetic foot infection ([place]) Active Problems:   HTN (hypertension)   Morbid obesity ([place])   S/P AVR (aortic valve replacement)   Severe protein-calorie malnutrition (HCC)   MSSA bacteremia    MEDICATIONS:    Scheduled Meds: . vitamin C  1,000 mg Oral Daily  . atorvastatin  80 mg Oral Daily  . Chlorhexidine Gluconate Cloth  6 each Topical Daily  . docusate sodium  100 mg Oral Daily  . enoxaparin (LOVENOX) injection  150 mg Subcutaneous Q12H  . insulin aspart  0-15 Units Subcutaneous TID WC  . insulin aspart  0-5  Units Subcutaneous QHS  . insulin glargine-yfgn  15 Units Subcutaneous Daily  . lisinopril  10 mg Oral Daily  . metoprolol tartrate  25 mg Oral BID  . mupirocin ointment  1 application Nasal BID  . nutrition supplement (JUVEN)  1 packet Oral BID BM  . pantoprazole  40 mg Oral Daily  . sertraline  50 mg Oral Daily  . warfarin  15 mg Oral q1600  . Warfarin - Pharmacist Dosing Inpatient   Does not apply q1600  . zinc sulfate  220 mg Oral Daily   Continuous Infusions: . sodium chloride Stopped (08/29/21 0535)  .  ceFAZolin (ANCEF) IV 2 g (09/02/21 0337)  . magnesium sulfate bolus IVPB     PRN Meds:.[DISCONTINUED] acetaminophen **OR** acetaminophen, acetaminophen, alum & mag hydroxide-simeth, bisacodyl, guaiFENesin-dextromethorphan, hydrALAZINE, HYDROmorphone (DILAUDID) injection, labetalol, magnesium citrate, magnesium sulfate bolus IVPB, metoprolol tartrate, ondansetron **OR** ondansetron (ZOFRAN) IV, oxyCODONE, oxyCODONE, phenol, polyethylene glycol, potassium chloride, sodium chloride flush  SUBJECTIVE:   24 hour events:  No acute events TEE negative PICC placed  No new complaints No fevers, chills Had a BM this morning Happy about his TEE.  Review of Systems  All other systems reviewed and are negative.    OBJECTIVE:   Blood pressure 106/69, pulse 80, temperature 98.2 F (36.8 C), resp. rate 18, height '5\' 10"'  (1.778 m), weight (!) 172.4 kg,  SpO2 96 %. Body mass index is 54.53 kg/m.  Physical Exam Constitutional:      General: He is not in acute distress.    Appearance: Normal appearance. He is obese.  HENT:     Head: Normocephalic and atraumatic.  Pulmonary:     Effort: Pulmonary effort is normal. No respiratory distress.  Musculoskeletal:     Comments: Right arm PICC Left BKA  Skin:    General: Skin is warm and dry.  Neurological:     General: No focal deficit present.     Mental Status: He is alert and oriented to person, place, and time.  Psychiatric:         Mood and Affect: Mood normal.        Behavior: Behavior normal.     Lab Results: Lab Results  Component Value Date   WBC 12.3 (H) 08/30/2021   HGB 11.2 (L) 08/30/2021   HCT 33.8 (L) 08/30/2021   MCV 83.3 08/30/2021   PLT 371 08/30/2021    Lab Results  Component Value Date   NA 134 (L) 08/28/2021   K 4.3 08/28/2021   CO2 24 08/28/2021   GLUCOSE 157 (H) 08/28/2021   BUN 9 08/28/2021   CREATININE 0.65 09/02/2021   CALCIUM 8.7 (L) 08/28/2021   GFRNONAA >60 09/02/2021   GFRAA 126 10/31/2020    Lab Results  Component Value Date   ALT 26 08/27/2021   AST 20 08/27/2021   ALKPHOS 76 08/27/2021   BILITOT 0.7 08/27/2021       Component Value Date/Time   CRP 12.8 (H) 08/29/2021 0036       Component Value Date/Time   ESRSEDRATE 104 (H) 08/29/2021 0036     I have reviewed the micro and lab results in Epic.  Imaging: ECHO TEE  Result Date: 09/01/2021    TRANSESOPHOGEAL ECHO REPORT   Patient Name:   Adam Long Date of Exam: 09/01/2021 Medical Rec #:  [id number]       Height:       70.0 in Accession #:    [id number]      Weight:       380.1 lb Date of Birth:  01/20/1976       BSA:          2.742 m Patient Age:    89 years        BP:           106/58 mmHg Patient Gender: M               HR:           101 bpm. Exam Location:  Inpatient Procedure: 3D Echo, Cardiac Doppler and Color Doppler Indications:     Bacteremia. I35.8 Other nonrheumatic aortic valve disorders  History:         Patient has prior history of Echocardiogram examinations, most                  recent 08/29/2021.  Sonographer:     Philipp Deputy RDCS Referring Phys:  [id number] Leanor Kail Diagnosing Phys: Oswaldo Milian MD PROCEDURE: After discussion of the risks and benefits of a TEE, an informed consent was obtained from the patient. The transesophogeal probe was passed without difficulty through the esophogus of the patient. Imaged were obtained with the patient in a left lateral decubitus  position. Sedation performed by different physician. The patient was monitored while under deep sedation. Anesthestetic sedation was provided intravenously by Anesthesiology: 986m of  Propofol, 186m of Lidocaine. Image quality was  adequate. The patient developed no complications during the procedure. IMPRESSIONS  1. Left ventricular ejection fraction, by estimation, is 60 to 65%. The left ventricle has normal function. The left ventricle has no regional wall motion abnormalities.  2. Right ventricular systolic function is normal. The right ventricular size is mildly enlarged.  3. No left atrial/left atrial appendage thrombus was detected.  4. The mitral valve is normal in structure. Trivial mitral valve regurgitation.  5. There is a 25 mm bileaflet valve present in the aortic position     Aortic valve regurgitation is not visualized. Echo findings are consistent with normal structure and function of the aortic valve prosthesis. Aortic valve mean gradient measures 13.0 mmHg. Conclusion(s)/Recommendation(s): No vegetation seen. Pulmonic valve poorly visualized due to shadowing from mechanical aortic valve. No clear vegetation on mechanical aortic valve. Would monitor serial echocardiogram while treating bacteremia if clinical  suspicion for endocarditis. FINDINGS  Left Ventricle: Left ventricular ejection fraction, by estimation, is 60 to 65%. The left ventricle has normal function. The left ventricle has no regional wall motion abnormalities. The left ventricular internal cavity size was normal in size. Right Ventricle: The right ventricular size is mildly enlarged. Right vetricular wall thickness was not well visualized. Right ventricular systolic function is normal. Left Atrium: Left atrial size was normal in size. No left atrial/left atrial appendage thrombus was detected. Right Atrium: Right atrial size was normal in size. Pericardium: There is no evidence of pericardial effusion. Mitral Valve: The mitral valve  is normal in structure. Trivial mitral valve regurgitation. Tricuspid Valve: The tricuspid valve is normal in structure. Tricuspid valve regurgitation is trivial. Aortic Valve: The aortic valve has been repaired/replaced. Aortic valve regurgitation is not visualized. Aortic valve mean gradient measures 13.0 mmHg. There is a 25 mm bileaflet valve present in the aortic position. Echo findings are consistent with normal structure and function of the aortic valve prosthesis. Pulmonic Valve: The pulmonic valve was not well visualized. Pulmonic valve regurgitation is trivial. Aorta: The aortic root and ascending aorta are structurally normal, with no evidence of dilitation. IAS/Shunts: No atrial level shunt detected by color flow Doppler.  AORTIC VALVE AV Mean Grad: 13.0 mmHg COswaldo MilianMD Electronically signed by COswaldo MilianMD Signature Date/Time: 09/01/2021/1:19:23 PM    Final    [place] SITE R[place]  Result Date: 09/01/2021 If Site Rite image not attached, placement could not be confirmed due to current cardiac rhythm.    Imaging independently reviewed in Epic.    ARaynelle Highlandfor Infectious Disease [place] [place] [phone] 09/02/2021, 9:03 AM  I spent greater than 35 minutes with the patient including greater than 50% of time in face to face counsel of the patient and in coordination of their care.

## 2021-09-02 NOTE — Progress Notes (Signed)
PMR Admission Coordinator Pre-Admission Assessment  Patient: Adam Long is an 45 y.o., male MRN: [id number] DOB: 05/30/1976 Height: 5' 10" (177.8 cm) Weight: (!) 172.4 kg  Insurance Information HMO:     PPO:      PCP:      IPA:      80/20:      OTHER:  PRIMARY: [employer]      Policy#: [id number]      Subscriber: patient CM Name: Jennifer      Phone#:      Fax#: [phone] Pre-Cert#: [id number]      Employer:  Benefits:  Phone #: [phone]     Name:  Eff. Date: 09/27/20-still active     Deduct: $700 ($700 met)      Out of Pocket Max: $2,900 ($2,900 met)      Life Max: NA CIR: 75% coverage, 25% co-insurance      SNF: 75% coverage, 25% co-insurance Outpatient: $30 co-pay     Co-Pay:  Home Health: 75% coverage      Co-Pay: 25% co-insurance  DME: 75% coverage     Co-Pay: 25% co-insurance (RENTAL ONLY) Providers: in-network SECONDARY:       Policy#:      Phone#:   Financial Counselor:       Phone#:   The "Data Collection Information Summary" for patients in Inpatient Rehabilitation Facilities with attached "Privacy Act Statement-Health Care Records" was provided and verbally reviewed with: Patient  Emergency Contact Information Contact Information     Name Relation Home Work Mobile   Webster,Linda Aunt [phone]  [phone]   Turner,Crystal Relative [phone]  [phone]   Friend,Bill Other   [phone]       Current Medical History  Patient Admitting Diagnosis: diabetic foot infection, s/p L BKA History of Present Illness: Pt is a 45 year old male with medical hx significant for: DM II, HTN, mechanical aortic valve replacement, coronary disease, hyperlipidemia, morbid obesity, left fifth and fourth toe amputation (03/11/21). Pt presented to [facility] on 08/27/21 d/t worsening left foot ulcer x1 week. Pt had associated left foot pain, drainage, subjective fever, chills. X-ray of left foot showed increasing edema and soft tissue gas about lateral aspect  of left foot. No definite osteomyelitis noted in ED. Pt transferred to [facility] for further treatment. Pt had left transtibial amputation  with application of Prevena wound VAC on 08/28/21. Blood cultures positive for MSSA. ID recommended  at least a month of IV antibiotics. Therapy evaluations completed and CIR recommended d/t pt's deficits in functional mobility and ability to complete ADLs independently.     Patient's medical record from [facility] has been reviewed by the rehabilitation admission coordinator and physician.  Past Medical History  Past Medical History:  Diagnosis Date   Anxiety    Aortic stenosis    Cellulitis and abscess of lower extremity 06/11/2019   Cellulitis of fourth toe of left foot    Cholelithiasis    Coronary artery disease    Nonobstructive CAD (40-50% LAD) 08/2019   Depression    Depression    Phreesia 09/27/2020   Depression    Phreesia 11/01/2020   Diabetes mellitus without complication (HCC)    Phreesia 09/27/2020   Elevated troponin level not due myocardial infarction 11/11/2019   Essential hypertension    Gangrene of toe of left foot (HCC) 07/06/2019   Heart murmur    Phreesia 09/27/2020   Hyperlipidemia    Phreesia 09/27/2020   Hypertension      Phreesia 09/27/2020   Mixed hyperlipidemia    Morbid obesity (HCC)    S/P aortic valve replacement with mechanical valve 12/05/2019   25 mm Carbomedics top hat bileaflet mechanical valve via partial upper hemi-sternotomy   Severe aortic stenosis 09/24/2019   Type 2 diabetes mellitus (HCC)     Has the patient had major surgery during 100 days prior to admission? Yes  Family History   family history includes Cancer in his mother; Heart disease in his father; Heart murmur in his sister; Hyperlipidemia in his father; Hypertension in his father; Stroke in his father.  Current Medications  Current Facility-Administered Medications:    0.9 %  sodium chloride infusion, ,  Intravenous, Continuous, Duda, Marcus V, MD, Stopped at 08/29/21 0535   [DISCONTINUED] acetaminophen (TYLENOL) tablet 650 mg, 650 mg, Oral, Q6H PRN **OR** acetaminophen (TYLENOL) suppository 650 mg, 650 mg, Rectal, Q6H PRN, Duda, Marcus V, MD   acetaminophen (TYLENOL) tablet 325-650 mg, 325-650 mg, Oral, Q6H PRN, Duda, Marcus V, MD   alum & mag hydroxide-simeth (MAALOX/MYLANTA) 200-200-20 MG/5ML suspension 15-30 mL, 15-30 mL, Oral, Q2H PRN, Duda, Marcus V, MD   ascorbic acid (VITAMIN C) tablet 1,000 mg, 1,000 mg, Oral, Daily, Duda, Marcus V, MD, 1,000 mg at 09/02/21 0947   atorvastatin (LIPITOR) tablet 80 mg, 80 mg, Oral, Daily, Duda, Marcus V, MD, 80 mg at 09/02/21 0946   bisacodyl (DULCOLAX) EC tablet 5 mg, 5 mg, Oral, Daily PRN, Duda, Marcus V, MD   ceFAZolin (ANCEF) IVPB 2g/100 mL premix, 2 g, Intravenous, Q8H, Martin, Elizabeth J, RPH, Last Rate: 200 mL/hr at 09/02/21 0337, 2 g at 09/02/21 0337   Chlorhexidine Gluconate Cloth 2 % PADS 6 each, 6 each, Topical, Daily, Feliz Ortiz, Abraham, MD, 6 each at 09/01/21 1610   docusate sodium (COLACE) capsule 100 mg, 100 mg, Oral, Daily, Duda, Marcus V, MD, 100 mg at 09/02/21 0946   enoxaparin (LOVENOX) injection 150 mg, 150 mg, Subcutaneous, Q12H, Reome, Earle J, RPH, 150 mg at 09/02/21 0947   guaiFENesin-dextromethorphan (ROBITUSSIN DM) 100-10 MG/5ML syrup 15 mL, 15 mL, Oral, Q4H PRN, Duda, Marcus V, MD   hydrALAZINE (APRESOLINE) injection 5 mg, 5 mg, Intravenous, Q20 Min PRN, Duda, Marcus V, MD   HYDROmorphone (DILAUDID) injection 0.5-1 mg, 0.5-1 mg, Intravenous, Q4H PRN, Duda, Marcus V, MD   insulin aspart (novoLOG) injection 0-15 Units, 0-15 Units, Subcutaneous, TID WC, Duda, Marcus V, MD, 2 Units at 09/02/21 0945   insulin aspart (novoLOG) injection 0-5 Units, 0-5 Units, Subcutaneous, QHS, Duda, Marcus V, MD   insulin glargine-yfgn (SEMGLEE) injection 15 Units, 15 Units, Subcutaneous, Daily, Duda, Marcus V, MD, 15 Units at 09/02/21 0946   labetalol  (NORMODYNE) injection 10 mg, 10 mg, Intravenous, Q10 min PRN, Duda, Marcus V, MD   lisinopril (ZESTRIL) tablet 10 mg, 10 mg, Oral, Daily, Feliz Ortiz, Abraham, MD, 10 mg at 09/02/21 0946   magnesium citrate solution 1 Bottle, 1 Bottle, Oral, Once PRN, Duda, Marcus V, MD   magnesium sulfate IVPB 2 g 50 mL, 2 g, Intravenous, Daily PRN, Duda, Marcus V, MD   metoprolol tartrate (LOPRESSOR) injection 2-5 mg, 2-5 mg, Intravenous, Q2H PRN, Duda, Marcus V, MD   metoprolol tartrate (LOPRESSOR) tablet 25 mg, 25 mg, Oral, BID, Feliz Ortiz, Abraham, MD, 25 mg at 09/02/21 0946   mupirocin ointment (BACTROBAN) 2 % 1 application, 1 application, Nasal, BID, Duda, Marcus V, MD, 1 application at 08/31/21 2108   nutrition supplement (JUVEN) (JUVEN) powder packet 1 packet, 1 packet,   Oral, BID BM, Duda, Marcus V, MD, 1 packet at 09/02/21 0947   ondansetron (ZOFRAN) tablet 4 mg, 4 mg, Oral, Q6H PRN **OR** ondansetron (ZOFRAN) injection 4 mg, 4 mg, Intravenous, Q6H PRN, Duda, Marcus V, MD   oxyCODONE (Oxy IR/ROXICODONE) immediate release tablet 10-15 mg, 10-15 mg, Oral, Q4H PRN, Duda, Marcus V, MD, 15 mg at 09/02/21 0944   oxyCODONE (Oxy IR/ROXICODONE) immediate release tablet 5-10 mg, 5-10 mg, Oral, Q4H PRN, Duda, Marcus V, MD, 10 mg at 08/29/21 1013   pantoprazole (PROTONIX) EC tablet 40 mg, 40 mg, Oral, Daily, Duda, Marcus V, MD, 40 mg at 09/02/21 0946   phenol (CHLORASEPTIC) mouth spray 1 spray, 1 spray, Mouth/Throat, PRN, Duda, Marcus V, MD   polyethylene glycol (MIRALAX / GLYCOLAX) packet 17 g, 17 g, Oral, Daily PRN, Duda, Marcus V, MD   potassium chloride SA (KLOR-CON M) CR tablet 20-40 mEq, 20-40 mEq, Oral, Daily PRN, Duda, Marcus V, MD   sertraline (ZOLOFT) tablet 50 mg, 50 mg, Oral, Daily, Duda, Marcus V, MD, 50 mg at 09/02/21 0946   sodium chloride flush (NS) 0.9 % injection 10-40 mL, 10-40 mL, Intracatheter, PRN, Feliz Ortiz, Abraham, MD   warfarin (COUMADIN) tablet 15 mg, 15 mg, Oral, q1600, Clark, Ruth P,  RPH, 15 mg at 09/01/21 1728   Warfarin - Pharmacist Dosing Inpatient, , Does not apply, q1600, Clark, Ruth P, RPH, Given at 09/01/21 1733   zinc sulfate capsule 220 mg, 220 mg, Oral, Daily, Duda, Marcus V, MD, 220 mg at 09/02/21 0946  Patients Current Diet:  Diet Order             Diet Carb Modified Fluid consistency: Thin; Room service appropriate? Yes  Diet effective now                   Precautions / Restrictions Precautions Precautions: Fall, Other (comment) Precaution Comments: wound vac Other Brace: limb protector Restrictions Weight Bearing Restrictions: Yes LLE Weight Bearing: Non weight bearing Other Position/Activity Restrictions: L limb protector   Has the patient had 2 or more falls or a fall with injury in the past year? No  Prior Activity Level Community (5-7x/wk): drives, works, gets out of house daily  Prior Functional Level Self Care: Did the patient need help bathing, dressing, using the toilet or eating? Independent  Indoor Mobility: Did the patient need assistance with walking from room to room (with or without device)? Independent  Stairs: Did the patient need assistance with internal or external stairs (with or without device)? Independent  Functional Cognition: Did the patient need help planning regular tasks such as shopping or remembering to take medications? Independent  Patient Information Are you of Hispanic, Latino/a,or Spanish origin?: A. No, not of Hispanic, Latino/a, or Spanish origin What is your race?: A. White Do you need or want an interpreter to communicate with a doctor or health care staff?: 0. No  Patient's Response To:  Health Literacy and Transportation Is the patient able to respond to health literacy and transportation needs?: Yes Health Literacy - How often do you need to have someone help you when you read instructions, pamphlets, or other written material from your doctor or pharmacy?: Never In the past 12 months, has  lack of transportation kept you from medical appointments or from getting medications?: No In the past 12 months, has lack of transportation kept you from meetings, work, or from getting things needed for daily living?: No  Home Assistive Devices / Equipment Home Assistive Devices/Equipment:   None Home Equipment: Rolling Walker (2 wheels), Cane - quad  Prior Device Use: Indicate devices/aids used by the patient prior to current illness, exacerbation or injury? None of the above  Current Functional Level Cognition  Overall Cognitive Status: Within Functional Limits for tasks assessed Orientation Level: Oriented X4    Extremity Assessment (includes Sensation/Coordination)  Upper Extremity Assessment: Overall WFL for tasks assessed  Lower Extremity Assessment: Defer to PT evaluation LLE Deficits / Details: s/p BKA    ADLs  Overall ADL's : Needs assistance/impaired Eating/Feeding: Independent, Sitting Grooming: Set up, Sitting, Wash/dry hands, Wash/dry face Upper Body Bathing: Set up, Sitting Lower Body Bathing: Maximal assistance, Sitting/lateral leans Upper Body Dressing : Set up, Sitting Lower Body Dressing: Moderate assistance Lower Body Dressing Details (indicate cue type and reason): Able to don sock on R foot at EOB with bed in lowest setting and RLE half-propped on bed. Assist to don underwear for managing wound vac and technique. Also requiring assist to stand and maintain balance to pull underwear over hips. Toilet Transfer: Moderate assistance, Stand-pivot, BSC/3in1, Rolling walker (2 wheels) Toileting- Clothing Manipulation and Hygiene: Maximal assistance, Sitting/lateral lean    Mobility  Overal bed mobility: Needs Assistance Bed Mobility: Sit to Supine, Supine to Sit Supine to sit: Supervision Sit to supine: Supervision General bed mobility comments: +rail, increased time    Transfers  Overall transfer level: Needs assistance Equipment used: Rolling walker (2  wheels) Transfers: Sit to/from Stand, Bed to chair/wheelchair/BSC Sit to Stand: Min assist Bed to/from chair/wheelchair/BSC transfer type:: Stand pivot Stand pivot transfers: Mod assist General transfer comment: cues for technique, assist to rise and steady    Ambulation / Gait / Stairs / Wheelchair Mobility  Ambulation/Gait Ambulation/Gait assistance: Min guard, Min assist, +2 safety/equipment Gait Distance (Feet): 5 Feet (+6') Assistive device: Rolling walker (2 wheels) General Gait Details: Hop to gait with short hops and heavy reliance on UEs. 1 seated rest break due to fatigue. Gait velocity: decreased    Posture / Balance Balance Overall balance assessment: Needs assistance Sitting-balance support: Feet supported, No upper extremity supported Sitting balance-Leahy Scale: Good Standing balance support: During functional activity, Reliant on assistive device for balance Standing balance-Leahy Scale: Poor Standing balance comment: reliant on B UE support    Special needs/care consideration Continuous Drip IV  0.9% sodium chloride infusion 75 mL/hr, Wound Vac Prevena wound VAC on left leg, Skin Amputation: leg/left; Surgical incision: leg/left, and Diabetic management Semglee 15 units daily; novoLOG 0-15 units 3x daily with meals; novoLOG 0-5 units daily at bedtime   Previous Home Environment (from acute therapy documentation) Living Arrangements: Other relatives (lives with uncle)  Lives With: Family (uncle) Available Help at Discharge: Family, Neighbor, Available PRN/intermittently Type of Home: House Home Layout: Two level, Able to live on main level with bedroom/bathroom Alternate Level Stairs-Rails: Right, Left, Can reach both Alternate Level Stairs-Number of Steps: 10 to 15 Home Access: Stairs to enter Entrance Stairs-Rails: Left Entrance Stairs-Number of Steps: 2-3 Bathroom Shower/Tub: Tub/shower unit Bathroom Toilet: Handicapped height Bathroom Accessibility: Yes How  Accessible: Accessible via wheelchair, Accessible via walker Home Care Services: No  Discharge Living Setting Plans for Discharge Living Setting: Patient's home Type of Home at Discharge: House Discharge Home Layout: Two level, Able to live on main level with bedroom/bathroom Discharge Home Access: Stairs to enter Entrance Stairs-Rails: Left Entrance Stairs-Number of Steps: 2-3 Discharge Bathroom Shower/Tub: Tub/shower unit Discharge Bathroom Toilet: Handicapped height Discharge Bathroom Accessibility: Yes How Accessible: Accessible via wheelchair, Accessible via walker   Does the patient have any problems obtaining your medications?: No  Social/Family/Support Systems Anticipated Caregiver: Linda Webster, aunt, other family and neighbors Anticipated Caregiver's Contact Information: [phone] Caregiver Availability: Intermittent  Goals Patient/Family Goal for Rehab: Supervision: PT/OT Expected length of stay: 12-14 days Pt/Family Agrees to Admission and willing to participate: Yes Program Orientation Provided & Reviewed with Pt/Caregiver Including Roles  & Responsibilities: Yes  Decrease burden of Care through IP rehab admission: NA  Possible need for SNF placement upon discharge: Not anticipated  Patient Condition: I have reviewed medical records from [facility], spoken with CSW, and patient and family member. I met with patient at the bedside and discussed via phone for inpatient rehabilitation assessment.  Patient will benefit from ongoing PT and OT, can actively participate in 3 hours of therapy a day 5 days of the week, and can make measurable gains during the admission.  Patient will also benefit from the coordinated team approach during an Inpatient Acute Rehabilitation admission.  The patient will receive intensive therapy as well as Rehabilitation physician, nursing, social worker, and care management interventions.  Due to safety, skin/wound care, disease management,  medication administration, pain management, and patient education the patient requires 24 hour a day rehabilitation nursing.  The patient is currently min A-min guard  with mobility and basic ADLs.  Discharge setting and todaytherapy post discharge at home with home health is anticipated.  Patient has agreed to participate in the Acute Inpatient Rehabilitation Program and will admit .  Preadmission Screen Completed By:  Lauren P Graves Madden, 09/02/2021 9:47 AM ______________________________________________________________________   Discussed status with Dr. Raulkar  on 09/02/21 at 930 and received approval for admission today.  Admission Coordinator:  Lauren P Graves Madden, CCC-SLP, time 947/Date 09/02/21 with day of updates by Jermichael Belmares, MS, CCC-SLP   Assessment/Plan: Diagnosis: BKA Does the need for close, 24 hr/day Medical supervision in concert with the patient's rehab needs make it unreasonable for this patient to be served in a less intensive setting? Yes Co-Morbidities requiring supervision/potential complications: DM2, mechanical aortic valve on warfarin, hypertension, morbid obesity, MSSA cellulitis Due to bladder management, bowel management, safety, skin/wound care, disease management, medication administration, pain management, and patient education, does the patient require 24 hr/day rehab nursing? Yes Does the patient require coordinated care of a physician, rehab nurse, PT, OT to address physical and functional deficits in the context of the above medical diagnosis(es)? Yes Addressing deficits in the following areas: balance, endurance, locomotion, strength, transferring, bowel/bladder control, bathing, dressing, feeding, grooming, toileting, and psychosocial support Can the patient actively participate in an intensive therapy program of at least 3 hrs of therapy 5 days a week? Yes The potential for patient to make measurable gains while on inpatient rehab is excellent Anticipated  functional outcomes upon discharge from inpatient rehab: supervision PT, supervision OT, supervision SLP Estimated rehab length of stay to reach the above functional goals is: 10-14 days Anticipated discharge destination: Home 10. Overall Rehab/Functional Prognosis: excellent   MD Signature: Krutika Raulkar, MD   Anticipated discharge destination: Home 10. Overall Rehab/Functional Prognosis: excellent     MD Signature: Leeroy Cha, MD

## 2021-09-02 NOTE — Progress Notes (Signed)
Patient transported to CIR in the chair. Patient hemodynamically stable during transportation.

## 2021-09-02 NOTE — Progress Notes (Addendum)
ANTICOAGULATION CONSULT NOTE - follow up  Pharmacy Consult for Coumadin and lovenox dosing Indication: VTE prophylaxis s/p BKA in a patient with a mechanical aortic valve replacement (12/05/2019) requiring anticoagulation  Allergies  Allergen Reactions   Pollen Extract Other (See Comments)    Runny nose, watery eyes, sneezing    Patient Measurements: Height: 5\' 10"  (177.8 cm) Weight: (!) 172.4 kg (380 lb 1.2 oz) IBW/kg (Calculated) : 73   Vital Signs: Temp: 98.2 F (36.8 C) (12/07 0735) BP: 106/69 (12/07 0735) Pulse Rate: 80 (12/07 0735)  Labs: Recent Labs    08/31/21 0446 08/31/21 1256 09/01/21 0155 09/02/21 0509  LABPROT 17.3*  --  17.4* 18.8*  INR 1.4*  --  1.4* 1.6*  HEPRLOWMOCWT  --  0.89  --   --   CREATININE 0.64  --   --  0.65   09/02/21:  Hgb 11.4,  HCT 36.2 , PLTC 407k  Estimated Creatinine Clearance: 186 mL/min (by C-G formula based on SCr of 0.65 mg/dL).   Medical History: Past Medical History:  Diagnosis Date   Anxiety    Aortic stenosis    Cellulitis and abscess of lower extremity 06/11/2019   Cellulitis of fourth toe of left foot    Cholelithiasis    Coronary artery disease    Nonobstructive CAD (40-50% LAD) 08/2019   Depression    Depression    Phreesia 09/27/2020   Depression    Phreesia 11/01/2020   Diabetes mellitus without complication (HCC)    Phreesia 09/27/2020   Elevated troponin level not due myocardial infarction 11/11/2019   Essential hypertension    Gangrene of toe of left foot (HCC) 07/06/2019   Heart murmur    Phreesia 09/27/2020   Hyperlipidemia    Phreesia 09/27/2020   Hypertension    Phreesia 09/27/2020   Mixed hyperlipidemia    Morbid obesity (HCC)    S/P aortic valve replacement with mechanical valve 12/05/2019   25 mm Carbomedics top hat bileaflet mechanical valve via partial upper hemi-sternotomy   Severe aortic stenosis 09/24/2019   Type 2 diabetes mellitus (HCC)     Medications:  Medications Prior to  Admission  Medication Sig Dispense Refill Last Dose   atorvastatin (LIPITOR) 80 MG tablet Take 1 tablet (80 mg total) by mouth daily. 90 tablet 3 Past Week   insulin glargine (LANTUS SOLOSTAR) 100 UNIT/ML Solostar Pen Inject 30 Units into the skin at bedtime. 30 mL 3 Past Week   lisinopril (ZESTRIL) 20 MG tablet Take 1 tablet by mouth once daily 90 tablet 0 08/26/2021   metoprolol tartrate (LOPRESSOR) 25 MG tablet Take 1 tablet by mouth twice daily 180 tablet 0 08/26/2021 at 0800   sertraline (ZOLOFT) 50 MG tablet Take 1 tablet by mouth once daily 90 tablet 1 08/26/2021   sitaGLIPtin-metformin (JANUMET) 50-500 MG tablet Take 1 tablet by mouth 2 (two) times daily with a meal. 180 tablet 3 08/26/2021   warfarin (COUMADIN) 10 MG tablet Take 1 tablet daily. 45 tablet 5 08/25/2021 at 1800    Assessment: Patient with a history of aortic valve replacement on December 05, 2019 (Carbomedics Top Hat mechanical in position 1).    PTA Coumadin dose 10mg  daily (last taken 11/29 PTA) and INR was therapeutic on admit. Coumadin held on admission for surgery.   S/p  left BKA 12/2.  Coumadin resumed 08/29/21.   Need for continued anticoagulation s/p BKA with limited mobility d/t recent surgery and a subtherapeutic INR (1.4 on 08/30/21)  requiring overlapping anticoagulation  until INR is therapeutic (2.0 - 3.0) given his co-morbidities.  Thus started Lovenox bridge 12/4 until INR > or equal to 2.0. on coumadin.    Lovenox dose is 150 mg sq Q12 hour.  On 08/31/21: anti-Xa level = 0.89 units/ml 4hr post lovenox dose.  Level is therapeutic (goal 0.6-1 units/ml).  SCr 0.65, stable.  CBC stable: Hgb 11.4,  HCT 36.2 , PLTC 407k.   Today 09/02/21, INR increased from 1.4 to 1.6 after  10mg , 10mg , 12mg  and 15mg   coumadin doses over past 4 days,  after coumadin held x3 days 11/30-12/2/22.  His PTA dose has been maintained on coumadin 10 mg daily for at least 6 months per review of his outpatient anti-coag visit notes in EPIC.    CBC stable : Hgb 11.4,  HCT 36.2 , PLTC 407k No bleeding reported.    Goal of Therapy:  INR 2-3 Anti-Xa level 0.6-1 units/ml 4hrs after LMWH dose given Monitor platelets by anticoagulation protocol: Yes   Plan:  Coumadin 12.5 mg po x 1 today (If he is discharged today, give dose before discharge). Then I recommend to resume PTA dose 10 mg daily starting tomorrow 12/8. Needs to follow up with anticog clinic/PCP for INR check on Friday 12/9.  Continue Lovenox 150 mg sq q12h until INR is 2.0 or higher.  Monitor INR daily CBC every 72 hours, next due 09/05/21   08-20-1991, RPh Clinical Pharmacist [phone] 09/02/2021,10:11 AM Please check AMION for all [facility] phone numbers After 10:00 PM, call Main Pharmacy [phone]

## 2021-09-02 NOTE — Discharge Summary (Signed)
Physician Discharge Summary  Adam Long RFX:[id number] DOB: [birth year] DOA: 08/27/2021  PCP: Noreene Larsson, NP  Admit date: 08/27/2021 Discharge date: 09/02/2021  Time spent: 60 minutes  Recommendations for Outpatient Follow-up:   Continue IV antibiotics cefazolin 2 g every 8 hours till 09/24/2021 Patient to go to inpatient rehab   Discharge Diagnoses:  Principal Problem:   Diabetic foot infection ([place]) Active Problems:   HTN (hypertension)   Morbid obesity ([place])   S/P AVR (aortic valve replacement)   Severe protein-calorie malnutrition ([place])   MSSA bacteremia   Discharge Condition: Stable  Diet recommendation: Heart healthy diet  Filed Weights   08/27/21 0853 08/28/21 1023 09/01/21 0957  Weight: (!) 172.4 kg (!) 172.4 kg (!) 172.4 kg    History of present illness:  45 year old male with medical history of diabetes mellitus type 2, mechanical arctic valve on warfarin, hypertension, morbid obesity came in with infected diabetic foot.  Orthopedic surgery was consulted, and transtibial amputation with application of wound VAC was performed.  Blood culture on admission was positive for MSSA.  ID was consulted and he was transitioned to IV cefazolin.  TEE was recommended.  Hospital Course:  Left diabetic foot infection/MSSA bacteremia -Patient started on IV cefazolin.  Blood culture was positive for MSSA. -Surveillance blood cultures from 08/28/2021 have been negative to date. -ID recommended 1 month of antibiotics.  Last day of antibiotics 09/24/2021. -TEE was negative for vegetations.  Mechanical aortic valve -INR subtherapeutic at 1.6.  Patient started on Lovenox and Coumadin bridging. -Pharmacy to manage at CIR  Uncontrolled diabetes mellitus type 2 with hyperglycemia -Hemoglobin A1c 7.8 -Blood glucose fairly well controlled. -Continue home regimen  Hypertension -Blood pressure is soft, dose of lisinopril changed to 10 mg daily -Continue metoprolol 25 mg p.o.  twice daily  Hyperlipidemia -Continue statin  Procedures: TEE Transtibial amputation  Consultations: Infectious disease Orthopedics  Discharge Exam: Vitals:   09/01/21 2126 09/02/21 0735  BP: 112/73 106/69  Pulse: (!) 110 80  Resp: 18 18  Temp: 98.3 F (36.8 C) 98.2 F (36.8 C)  SpO2: 97% 96%    General: Appears in no acute distress Cardiovascular: S1-S2, regular, no murmur auscultated Respiratory: Clear to auscultation bilaterally  Discharge Instructions   Discharge Instructions     [facility] pharmacist to adjust dose for Vancomycin, Aminoglycosides and other anti-infective therapies as requested by physician.   Complete by: As directed    [facility] to provide Cath Flo 50m   Complete by: As directed    Administer for PICC line occlusion and as ordered by physician for other access device issues.   Anaphylaxis Kit: Provided to treat any anaphylactic reaction to the medication being provided to the patient if First Dose or when requested by physician   Complete by: As directed    Epinephrine 135mml vial / amp: Administer 0.50m60m0.50ml65mubcutaneously once for moderate to severe anaphylaxis, nurse to call physician and pharmacy when reaction occurs and call 911 if needed for immediate care   Diphenhydramine 50mg90mIV vial: Administer 25-50mg 18mM PRN for first dose reaction, rash, itching, mild reaction, nurse to call physician and pharmacy when reaction occurs   Sodium Chloride 0.9% NS 500ml I17mdminister if needed for hypovolemic blood pressure drop or as ordered by physician after call to physician with anaphylactic reaction   Change dressing on IV access line weekly and PRN   Complete by: As directed    Diet - low sodium heart healthy  Complete by: As directed    Discharge wound care:   Complete by: As directed    Negative pressure wound therapy   Flush IV access with Sodium Chloride 0.9% and Heparin 10 units/ml or 100 units/ml    Complete by: As directed    Home infusion instructions - [facility]   Complete by: As directed    Instructions: Flush IV access with Sodium Chloride 0.9% and Heparin 10units/ml or 100units/ml   Change dressing on IV access line: Weekly and PRN   Instructions Cath Flo 44m: Administer for PICC Line occlusion and as ordered by physician for other access device   [facility] pharmacist to adjust dose for: Vancomycin, Aminoglycosides and other anti-infective therapies as requested by physician   Increase activity slowly   Complete by: As directed    Method of administration may be changed at the discretion of home infusion pharmacist based upon assessment of the patient and/or caregiver's ability to self-administer the medication ordered   Complete by: As directed       Allergies as of 09/02/2021       Reactions   Pollen Extract Other (See Comments)   Runny nose, watery eyes, sneezing        Medication List     TAKE these medications    atorvastatin 80 MG tablet Commonly known as: LIPITOR Take 1 tablet (80 mg total) by mouth daily.   ceFAZolin  IVPB Commonly known as: ANCEF Inject 2 g into the vein every 8 (eight) hours for 22 days. Indication:  MSSA + GBS bacteremia First Dose: Yes Last Day of Therapy:  09/24/21 Labs - Once weekly:  CBC/D and BMP, Labs - Every other week:  ESR and CRP Method of administration: IV Push Method of administration may be changed at the discretion of home infusion pharmacist based upon assessment of the patient and/or caregiver's ability to self-administer the medication ordered.   enoxaparin 150 MG/ML injection Commonly known as: LOVENOX Inject 1 mL (150 mg total) into the skin every 12 (twelve) hours.   Janumet 50-500 MG tablet Generic drug: sitaGLIPtin-metformin Take 1 tablet by mouth 2 (two) times daily with a meal.   Lantus SoloStar 100 UNIT/ML Solostar Pen Generic drug: insulin glargine Inject 30 Units into the  skin at bedtime.   lisinopril 10 MG tablet Commonly known as: ZESTRIL Take 1 tablet (10 mg total) by mouth daily. Start taking on: September 03, 2021 What changed:  medication strength how much to take how to take this when to take this additional instructions   metoprolol tartrate 25 MG tablet Commonly known as: LOPRESSOR Take 1 tablet by mouth twice daily   Oxycodone HCl 10 MG Tabs Take 1-1.5 tablets (10-15 mg total) by mouth every 4 (four) hours as needed for severe pain (pain score 7-10).   pantoprazole 40 MG tablet Commonly known as: PROTONIX Take 1 tablet (40 mg total) by mouth daily. Start taking on: September 03, 2021   sertraline 50 MG tablet Commonly known as: ZOLOFT Take 1 tablet by mouth once daily   warfarin 7.5 MG tablet Commonly known as: COUMADIN Take as directed. If you are unsure how to take this medication, talk to your nurse or doctor. Original instructions: Take 2 tablets (15 mg total) by mouth daily at 4 PM. What changed:  medication strength how much to take how to take this when to take this additional instructions  Discharge Care Instructions  (From admission, onward)           Start     Ordered   09/02/21 0000  Change dressing on IV access line weekly and PRN  (Home infusion instructions - [facility] )        09/02/21 1128   09/02/21 0000  Discharge wound care:       Comments: Negative pressure wound therapy   09/02/21 1128           Allergies  Allergen Reactions   Pollen Extract Other (See Comments)    Runny nose, watery eyes, sneezing    Follow-up Information     Newt Minion, MD Follow up in 1 week(s).   Specialty: Orthopedic Surgery Contact information: [place] [address] [phone]         Mignon Pine, DO. Go on 10/02/2021.   Specialties: Infectious Diseases, Internal Medicine Why: appt at 930am Contact information: [address] [place]  [address] [phone]                  The results of significant diagnostics from this hospitalization (including imaging, microbiology, ancillary and laboratory) are listed below for reference.    Significant Diagnostic Studies: DG Foot Complete Left  Result Date: 08/27/2021 CLINICAL DATA:  Wound EXAM: LEFT FOOT - COMPLETE 3+ VIEW COMPARISON:  Radiograph dated May 13, 2021 FINDINGS: Prior amputations of the fourth and fifth toes with heterotopic ossification. Osseous deformities with heterotopic calcification of the second and third metatarsals due to prior fractures. No definite areas of osseous destruction. Increased soft tissue swelling and new soft tissue gas of the lateral left foot. IMPRESSION: 1. Increased soft tissue swelling and new soft tissue gas of the lateral left foot, findings are concerning for infection with gas-forming organism. 2. No definite osteomyelitis, although chronic changes somewhat limited evaluation. IF there is clinical concern, consider MRI. Electronically Signed   By: Yetta Glassman M.D.   On: 08/27/2021 10:24   ECHOCARDIOGRAM COMPLETE  Result Date: 08/29/2021    ECHOCARDIOGRAM REPORT   Patient Name:   HARROL NOVELLO Date of Exam: 08/29/2021 Medical Rec #:  [id number]       Height:       70.0 in Accession #:    [id number]      Weight:       380.1 lb Date of Birth:  May 21, 1976       BSA:          2.742 m Patient Age:    23 years        BP:           137/63 mmHg Patient Gender: M               HR:           95 bpm. Exam Location:  Inpatient Procedure: 2D Echo, Color Doppler, Cardiac Doppler and Intracardiac            Opacification Agent Indications:    Bacteremia  History:        Patient has prior history of Echocardiogram examinations. Risk                 Factors:Hypertension and Diabetes.                 Aortic Valve: 25 mm mechanical valve is present in the aortic  position.  Sonographer:    Jyl Heinz Referring Phys:  [place]  1. Left ventricular ejection fraction, by estimation, is 60 to 65%. The left ventricle has normal function. The left ventricle has no regional wall motion abnormalities. There is mild left ventricular hypertrophy. Left ventricular diastolic parameters were normal.  2. Right ventricular systolic function is normal. The right ventricular size is normal.  3. Trivial mitral valve regurgitation. Moderate mitral annular calcification.  4. 25 mm [employer] Top Hat bileaflet mechanical prosthesis present Peak and mean gradients through the valve are 34 and 20 mm Hg respectively. COmpared to study from 2021, mean gradient is mildly increased. Leaflets not well seen . The aortic valve has been repaired/replaced. Aortic valve regurgitation is not visualized. There is a 25 mm mechanical valve present in the aortic position.  5. The inferior vena cava is normal in size with greater than 50% respiratory variability, suggesting right atrial pressure of 3 mmHg. FINDINGS  Left Ventricle: Left ventricular ejection fraction, by estimation, is 60 to 65%. The left ventricle has normal function. The left ventricle has no regional wall motion abnormalities. The left ventricular internal cavity size was normal in size. There is  mild left ventricular hypertrophy. Left ventricular diastolic parameters were normal. Right Ventricle: The right ventricular size is normal. Right vetricular wall thickness was not assessed. Right ventricular systolic function is normal. Left Atrium: Left atrial size was normal in size. Right Atrium: Right atrial size was normal in size. Pericardium: There is no evidence of pericardial effusion. Mitral Valve: There is mild thickening of the mitral valve leaflet(s). Moderate mitral annular calcification. Trivial mitral valve regurgitation. Tricuspid Valve: The tricuspid valve is grossly normal. Tricuspid valve regurgitation is trivial. Aortic Valve: 25 mm [employer] Top Hat  bileaflet mechanical prosthesis present Peak and mean gradients through the valve are 34 and 20 mm Hg respectively. COmpared to study from 2021, mean gradient is mildly increased. Leaflets not well seen. The aortic  valve has been repaired/replaced. Aortic valve regurgitation is not visualized. Aortic valve mean gradient measures 19.0 mmHg. Aortic valve peak gradient measures 33.5 mmHg. Aortic valve area, by VTI measures 1.35 cm. There is a 25 mm mechanical valve present in the aortic position. Pulmonic Valve: The pulmonic valve was normal in structure. Pulmonic valve regurgitation is not visualized. Aorta: The aortic root and ascending aorta are structurally normal, with no evidence of dilitation. Venous: The inferior vena cava is normal in size with greater than 50% respiratory variability, suggesting right atrial pressure of 3 mmHg. IAS/Shunts: No atrial level shunt detected by color flow Doppler.  LEFT VENTRICLE PLAX 2D LVIDd:         5.10 cm      Diastology LVIDs:         3.40 cm      LV e' medial:    6.64 cm/s LV PW:         1.40 cm      LV E/e' medial:  12.2 LV IVS:        1.20 cm      LV e' lateral:   6.64 cm/s LVOT diam:     2.00 cm      LV E/e' lateral: 12.2 LV SV:         68 LV SV Index:   25 LVOT Area:     3.14 cm  LV Volumes (MOD) LV vol d, MOD A2C: 157.0 ml LV vol d, MOD A4C: 160.0 ml LV vol  s, MOD A2C: 56.8 ml LV vol s, MOD A4C: 57.1 ml LV SV MOD A2C:     100.2 ml LV SV MOD A4C:     160.0 ml LV SV MOD BP:      100.9 ml RIGHT VENTRICLE             IVC RV Basal diam:  4.60 cm     IVC diam: 2.30 cm RV Mid diam:    3.40 cm RV S prime:     11.60 cm/s TAPSE (M-mode): 1.8 cm LEFT ATRIUM             Index        RIGHT ATRIUM           Index LA diam:        4.20 cm 1.53 cm/m   RA Area:     21.60 cm LA Vol (A2C):   47.1 ml 17.18 ml/m  RA Volume:   68.40 ml  24.94 ml/m LA Vol (A4C):   31.2 ml 11.38 ml/m LA Biplane Vol: 42.1 ml 15.35 ml/m  AORTIC VALVE AV Area (Vmax):    1.34 cm AV Area (Vmean):   1.27  cm AV Area (VTI):     1.35 cm AV Vmax:           289.50 cm/s AV Vmean:          209.500 cm/s AV VTI:            0.506 m AV Peak Grad:      33.5 mmHg AV Mean Grad:      19.0 mmHg LVOT Vmax:         123.50 cm/s LVOT Vmean:        84.450 cm/s LVOT VTI:          0.217 m LVOT/AV VTI ratio: 0.43  AORTA Ao Asc diam: 3.50 cm MITRAL VALVE               TRICUSPID VALVE MV Area (PHT): 4.46 cm    TR Peak grad:   23.8 mmHg MV Decel Time: 170 msec    TR Vmax:        244.00 cm/s MV E velocity: 81.10 cm/s MV A velocity: 87.40 cm/s  SHUNTS MV E/A ratio:  0.93        Systemic VTI:  0.22 m                            Systemic Diam: 2.00 cm Dorris Carnes MD Electronically signed by Dorris Carnes MD Signature Date/Time: 08/29/2021/4:14:15 PM    Final    ECHO TEE  Result Date: 09/01/2021    TRANSESOPHOGEAL ECHO REPORT   Patient Name:   AMARIS GARRETTE Date of Exam: 09/01/2021 Medical Rec #:  [id number]       Height:       70.0 in Accession #:    [id number]      Weight:       380.1 lb Date of Birth:  1976-04-06       BSA:          2.742 m Patient Age:    45 years        BP:           106/58 mmHg Patient Gender: M               HR:           101 bpm.  Exam Location:  Inpatient Procedure: 3D Echo, Cardiac Doppler and Color Doppler Indications:     Bacteremia. I35.8 Other nonrheumatic aortic valve disorders  History:         Patient has prior history of Echocardiogram examinations, most                  recent 08/29/2021.  Sonographer:     Philipp Deputy RDCS Referring Phys:  [id number] Leanor Kail Diagnosing Phys: Oswaldo Milian MD PROCEDURE: After discussion of the risks and benefits of a TEE, an informed consent was obtained from the patient. The transesophogeal probe was passed without difficulty through the esophogus of the patient. Imaged were obtained with the patient in a left lateral decubitus position. Sedation performed by different physician. The patient was monitored while under deep sedation. Anesthestetic sedation was  provided intravenously by Anesthesiology: 977m of Propofol, 1058mof Lidocaine. Image quality was  adequate. The patient developed no complications during the procedure. IMPRESSIONS  1. Left ventricular ejection fraction, by estimation, is 60 to 65%. The left ventricle has normal function. The left ventricle has no regional wall motion abnormalities.  2. Right ventricular systolic function is normal. The right ventricular size is mildly enlarged.  3. No left atrial/left atrial appendage thrombus was detected.  4. The mitral valve is normal in structure. Trivial mitral valve regurgitation.  5. There is a 25 mm bileaflet valve present in the aortic position     Aortic valve regurgitation is not visualized. Echo findings are consistent with normal structure and function of the aortic valve prosthesis. Aortic valve mean gradient measures 13.0 mmHg. Conclusion(s)/Recommendation(s): No vegetation seen. Pulmonic valve poorly visualized due to shadowing from mechanical aortic valve. No clear vegetation on mechanical aortic valve. Would monitor serial echocardiogram while treating bacteremia if clinical  suspicion for endocarditis. FINDINGS  Left Ventricle: Left ventricular ejection fraction, by estimation, is 60 to 65%. The left ventricle has normal function. The left ventricle has no regional wall motion abnormalities. The left ventricular internal cavity size was normal in size. Right Ventricle: The right ventricular size is mildly enlarged. Right vetricular wall thickness was not well visualized. Right ventricular systolic function is normal. Left Atrium: Left atrial size was normal in size. No left atrial/left atrial appendage thrombus was detected. Right Atrium: Right atrial size was normal in size. Pericardium: There is no evidence of pericardial effusion. Mitral Valve: The mitral valve is normal in structure. Trivial mitral valve regurgitation. Tricuspid Valve: The tricuspid valve is normal in structure. Tricuspid  valve regurgitation is trivial. Aortic Valve: The aortic valve has been repaired/replaced. Aortic valve regurgitation is not visualized. Aortic valve mean gradient measures 13.0 mmHg. There is a 25 mm bileaflet valve present in the aortic position. Echo findings are consistent with normal structure and function of the aortic valve prosthesis. Pulmonic Valve: The pulmonic valve was not well visualized. Pulmonic valve regurgitation is trivial. Aorta: The aortic root and ascending aorta are structurally normal, with no evidence of dilitation. IAS/Shunts: No atrial level shunt detected by color flow Doppler.  AORTIC VALVE AV Mean Grad: 13.0 mmHg ChOswaldo MilianD Electronically signed by ChOswaldo MilianD Signature Date/Time: 09/01/2021/1:19:23 PM    Final    [place] SITE RITE  Result Date: 09/01/2021 If Site Rite image not attached, placement could not be confirmed due to current cardiac rhythm.   Microbiology: Recent Results (from the past 240 hour(s))  Resp Panel by RT-PCR (Flu A&B, Covid) Nasopharyngeal Swab     Status: None  Collection Time: 08/27/21 10:03 AM   Specimen: Nasopharyngeal Swab; Nasopharyngeal(NP) swabs in vial transport medium  Result Value Ref Range Status   SARS Coronavirus 2 by RT PCR NEGATIVE NEGATIVE Final    Comment: (NOTE) SARS-CoV-2 target nucleic acids are NOT DETECTED.  The SARS-CoV-2 RNA is generally detectable in upper respiratory specimens during the acute phase of infection. The lowest concentration of SARS-CoV-2 viral copies this assay can detect is 138 copies/mL. A negative result does not preclude SARS-Cov-2 infection and should not be used as the sole basis for treatment or other patient management decisions. A negative result may occur with  improper specimen collection/handling, submission of specimen other than nasopharyngeal swab, presence of viral mutation(s) within the areas targeted by this assay, and inadequate number of viral copies(<138  copies/mL). A negative result must be combined with clinical observations, patient history, and epidemiological information. The expected result is Negative.  Fact Sheet for Patients:  EntrepreneurPulse.com.au  Fact Sheet for Healthcare Providers:  IncredibleEmployment.be  This test is no t yet approved or cleared by the [place] FDA and  has been authorized for detection and/or diagnosis of SARS-CoV-2 by FDA under an Emergency Use Authorization (EUA). This EUA will remain  in effect (meaning this test can be used) for the duration of the COVID-19 declaration under Section 564(b)(1) of the Act, 21 U.S.C.section 360bbb-3(b)(1), unless the authorization is terminated  or revoked sooner.       Influenza A by PCR NEGATIVE NEGATIVE Final   Influenza B by PCR NEGATIVE NEGATIVE Final    Comment: (NOTE) The Xpert Xpress SARS-CoV-2/FLU/RSV plus assay is intended as an aid in the diagnosis of influenza from Nasopharyngeal swab specimens and should not be used as a sole basis for treatment. Nasal washings and aspirates are unacceptable for Xpert Xpress SARS-CoV-2/FLU/RSV testing.  Fact Sheet for Patients: EntrepreneurPulse.com.au  Fact Sheet for Healthcare Providers: IncredibleEmployment.be  This test is not yet approved or cleared by the [place] FDA and has been authorized for detection and/or diagnosis of SARS-CoV-2 by FDA under an Emergency Use Authorization (EUA). This EUA will remain in effect (meaning this test can be used) for the duration of the COVID-19 declaration under Section 564(b)(1) of the Act, 21 U.S.C. section 360bbb-3(b)(1), unless the authorization is terminated or revoked.  Performed at [facility], [address] [place], [place] [address]   Culture, blood (routine x 2)     Status: None   Collection Time: 08/27/21 10:28 AM   Specimen: Left Antecubital; Blood  Result Value Ref  Range Status   Specimen Description LEFT ANTECUBITAL  Final   Special Requests   Final    BOTTLES DRAWN AEROBIC AND ANAEROBIC Blood Culture adequate volume   Culture   Final    NO GROWTH 5 DAYS Performed at [facility], [address], [place], [place] [address]    Report Status 09/01/2021 FINAL  Final  Culture, blood (routine x 2)     Status: Abnormal   Collection Time: 08/27/21 10:40 AM   Specimen: Right Antecubital; Blood  Result Value Ref Range Status   Specimen Description   Final    RIGHT ANTECUBITAL Performed at [facility], [address], [place], [place] [address]    Special Requests   Final    BOTTLES DRAWN AEROBIC AND ANAEROBIC Blood Culture results may not be optimal due to an excessive volume of blood received in culture bottles Performed at [facility], [address] [place], [place] [address]    Culture  Setup Time   Final    GRAM POSITIVE COCCI IN BOTH AEROBIC AND ANAEROBIC BOTTLES Gram Stain Report Called to,Read Back By and Verified With: Coleman,V@ [place] 5N_0  by Zigmund Daniel, b 12.2.22 CRITICAL RESULT CALLED TO, READ BACK BY AND VERIFIED WITH: PHARMD E MARTIN [id number] AT 1306 BY CM Performed at [facility], [place] [address], [place], [place] [address]    Culture STAPHYLOCOCCUS AUREUS STREPTOCOCCUS AGALACTIAE  (A)  Final   Report Status 08/31/2021 FINAL  Final   Organism ID, Bacteria STAPHYLOCOCCUS AUREUS  Final   Organism ID, Bacteria STREPTOCOCCUS AGALACTIAE  Final      Susceptibility   Streptococcus agalactiae - MIC*    CLINDAMYCIN <=0.25 SENSITIVE Sensitive     AMPICILLIN <=0.25 SENSITIVE Sensitive     ERYTHROMYCIN <=0.12 SENSITIVE Sensitive     VANCOMYCIN 0.5 SENSITIVE Sensitive     CEFTRIAXONE <=0.12 SENSITIVE Sensitive     LEVOFLOXACIN 1 SENSITIVE Sensitive     * STREPTOCOCCUS AGALACTIAE   Staphylococcus aureus - MIC*    CIPROFLOXACIN <=0.5 SENSITIVE Sensitive     ERYTHROMYCIN <=0.25 SENSITIVE Sensitive     GENTAMICIN <=0.5 SENSITIVE  Sensitive     OXACILLIN 0.5 SENSITIVE Sensitive     TETRACYCLINE <=1 SENSITIVE Sensitive     VANCOMYCIN 1 SENSITIVE Sensitive     TRIMETH/SULFA <=10 SENSITIVE Sensitive     CLINDAMYCIN <=0.25 SENSITIVE Sensitive     RIFAMPIN <=0.5 SENSITIVE Sensitive     Inducible Clindamycin NEGATIVE Sensitive     * STAPHYLOCOCCUS AUREUS  Blood Culture ID Panel (Reflexed)     Status: Abnormal   Collection Time: 08/27/21 10:40 AM  Result Value Ref Range Status   Enterococcus faecalis NOT DETECTED NOT DETECTED Final   Enterococcus Faecium NOT DETECTED NOT DETECTED Final   Listeria monocytogenes NOT DETECTED NOT DETECTED Final   Staphylococcus species DETECTED (A) NOT DETECTED Final    Comment: CRITICAL RESULT CALLED TO, READ BACK BY AND VERIFIED WITH: PHARMD E MARTIN [id number] AT 1306 BY CM    Staphylococcus aureus (BCID) DETECTED (A) NOT DETECTED Final    Comment: CRITICAL RESULT CALLED TO, READ BACK BY AND VERIFIED WITH: PHARMD E MARTIN [id number] AT 1306 BY CM    Staphylococcus epidermidis NOT DETECTED NOT DETECTED Final   Staphylococcus lugdunensis NOT DETECTED NOT DETECTED Final   Streptococcus species NOT DETECTED NOT DETECTED Final   Streptococcus agalactiae NOT DETECTED NOT DETECTED Final   Streptococcus pneumoniae NOT DETECTED NOT DETECTED Final   Streptococcus pyogenes NOT DETECTED NOT DETECTED Final   A.calcoaceticus-baumannii NOT DETECTED NOT DETECTED Final   Bacteroides fragilis NOT DETECTED NOT DETECTED Final   Enterobacterales NOT DETECTED NOT DETECTED Final   Enterobacter cloacae complex NOT DETECTED NOT DETECTED Final   Escherichia coli NOT DETECTED NOT DETECTED Final   Klebsiella aerogenes NOT DETECTED NOT DETECTED Final   Klebsiella oxytoca NOT DETECTED NOT DETECTED Final   Klebsiella pneumoniae NOT DETECTED NOT DETECTED Final   Proteus species NOT DETECTED NOT DETECTED Final   Salmonella species NOT DETECTED NOT DETECTED Final   Serratia marcescens NOT DETECTED NOT DETECTED Final    Haemophilus influenzae NOT DETECTED NOT DETECTED Final   Neisseria meningitidis NOT DETECTED NOT DETECTED Final   Pseudomonas aeruginosa NOT DETECTED NOT DETECTED Final   Stenotrophomonas maltophilia NOT DETECTED NOT DETECTED Final   Candida albicans NOT DETECTED NOT DETECTED Final   Candida auris NOT DETECTED NOT DETECTED Final   Candida glabrata NOT DETECTED NOT DETECTED Final   Candida krusei  NOT DETECTED NOT DETECTED Final   Candida parapsilosis NOT DETECTED NOT DETECTED Final   Candida tropicalis NOT DETECTED NOT DETECTED Final   Cryptococcus neoformans/gattii NOT DETECTED NOT DETECTED Final   Meth resistant mecA/C and MREJ NOT DETECTED NOT DETECTED Final    Comment: Performed at [facility], [place] [address], [place], [place] [address]  Surgical PCR screen     Status: Abnormal   Collection Time: 08/28/21  9:41 AM   Specimen: Nasal Mucosa; Nasal Swab  Result Value Ref Range Status   MRSA, PCR NEGATIVE NEGATIVE Final   Staphylococcus aureus POSITIVE (A) NEGATIVE Final    Comment: (NOTE) The Xpert SA Assay (FDA approved for NASAL specimens in patients 45 years of age and older), is one component of a comprehensive surveillance program. It is not intended to diagnose infection nor to guide or monitor treatment. Performed at [facility], [place] [address], [place], [place] [address]   Culture, blood (Routine X 2) w Reflex to ID Panel     Status: None   Collection Time: 08/28/21  3:31 PM   Specimen: BLOOD  Result Value Ref Range Status   Specimen Description BLOOD RIGHT ARM  Final   Special Requests   Final    BOTTLES DRAWN AEROBIC AND ANAEROBIC Blood Culture adequate volume   Culture   Final    NO GROWTH 5 DAYS Performed at [facility], [place] [address] [place], [place] [address]    Report Status 09/02/2021 FINAL  Final  Culture, blood (Routine X 2) w Reflex to ID Panel     Status: None   Collection Time: 08/28/21  3:34 PM   Specimen: BLOOD  Result Value Ref  Range Status   Specimen Description BLOOD LEFT HAND  Final   Special Requests   Final    BOTTLES DRAWN AEROBIC ONLY Blood Culture adequate volume   Culture   Final    NO GROWTH 5 DAYS Performed at [facility], [place] [address] [place], [place] [address]    Report Status 09/02/2021 FINAL  Final     Labs: Basic Metabolic Panel: Recent Labs  Lab 08/27/21 0950 08/28/21 0545 08/31/21 0446 09/02/21 0509  NA 132* 134*  --   --   K 4.3 4.3  --   --   CL 97* 101  --   --   CO2 26 24  --   --   GLUCOSE 212* 157*  --   --   BUN 11 9  --   --   CREATININE 0.72 0.67 0.64 0.65  CALCIUM 8.7* 8.7*  --   --    Liver Function Tests: Recent Labs  Lab 08/27/21 0950  AST 20  ALT 26  ALKPHOS 76  BILITOT 0.7  PROT 7.4  ALBUMIN 2.7*   No results for input(s): LIPASE, AMYLASE in the last 168 hours. No results for input(s): AMMONIA in the last 168 hours. CBC: Recent Labs  Lab 08/27/21 0950 08/28/21 0545 08/29/21 0036 08/30/21 0336 08/30/21 0945 09/02/21 0950  WBC 15.3* 11.3* 13.1* 13.4* 12.3* 14.3*  NEUTROABS 12.7*  --   --   --   --   --   HGB 10.7* 11.1* 10.4* 10.7* 11.2* 11.4*  HCT 33.1* 34.3* 31.9* 33.4* 33.8* 36.2*  MCV 86.9 84.3 83.3 84.3 83.3 85.0  PLT 266 282 290 322 371 407*   Cardiac Enzymes: No results for input(s): CKTOTAL, CKMB, CKMBINDEX, TROPONINI in the last 168 hours.  BNP: BNP (last 3 results) No results for input(s): BNP in the last 8760 hours.  ProBNP (last 3 results) No results for input(s): PROBNP in the last 8760 hours.  CBG: Recent Labs  Lab 09/01/21 1229 09/01/21 1444 09/01/21 1705 09/01/21 2120 09/02/21 0736  GLUCAP 120* 107* 140* 139* 142*       Signed:  Oswald Hillock MD.  [facility] 09/02/2021, 11:28 AM

## 2021-09-02 NOTE — Progress Notes (Signed)
Inpatient Rehab Admissions Coordinator:   I have a CIR bed for this pt. Today. RN can call report to [phone].  Megan Salon, MS, CCC-SLP Rehab Admissions Coordinator  [phone] (celll) [phone] (office)

## 2021-09-02 NOTE — Progress Notes (Signed)
PHARMACY CONSULT NOTE FOR:  OUTPATIENT  PARENTERAL ANTIBIOTIC THERAPY (OPAT)  Indication: MSSA + GBS bacteremia Regimen: Cefazolin 2g IV every 8 hours End date: 09/24/21  IV antibiotic discharge orders are pended. To discharging provider:  please sign these orders via discharge navigator,  Select New Orders & click on the button choice - Manage This Unsigned Work.     Thank you for allowing pharmacy to be a part of this patient's care.  Georgina Pillion, PharmD, BCPS Clinical Pharmacist 09/02/2021 9:08 AM   **Pharmacist phone directory can now be found on [email or web address] ([email or web address]).  Listed under [facility].

## 2021-09-02 NOTE — Progress Notes (Signed)
Wound vac dressing removed and 4*4s dry, ABD pad and ACE wrap applied per order. Sutures and staples in place and intact. Minimal sign of bleeding noted. Dressing was performed using sterile technique. Will transport patient to CIR later.

## 2021-09-02 NOTE — Progress Notes (Signed)
Inpatient Rehabilitation Admission Medication Review by a Pharmacist  A complete drug regimen review was completed for this patient to identify any potential clinically significant medication issues.  High Risk Drug Classes Is patient taking? Indication by Medication  Antipsychotic Yes Prochlorperazine prn nausea  Anticoagulant Yes Warfarin and enoxaparin for mechanical aortic and valve stroke prophylaxis   Antibiotic Yes Cefazolin for osteomyelitis and bacteremia   Opioid Yes Oxycodone PRN pain   Antiplatelet No   Hypoglycemics/insulin No   Vasoactive Medication Yes Metoprolol for hypertension   Chemotherapy No   Other No      Type of Medication Issue Identified Description of Issue Recommendation(s)  Drug Interaction(s) (clinically significant)     Duplicate Therapy  Warfarin and enoxaparin  Will stop enoxaparin when INR > 2   Allergy     No Medication Administration End Date  Cefazolin to end 12/29 per ID/OPAT Antibiotic end date added on order verification   Incorrect Dose     Additional Drug Therapy Needed  Atorvastatin 80mg , Janumet, insulin sliding scale, protein nutrition supplement, and oxycodone 5-10mg  prn moderate pain not ordered Consider ordering atorvastatin 80mg  and Janumet per discharge summary   Consider starting insulin sliding scale, protein nutrition supplement, and oxycodone 5-10mg  prn moderate pain per inpatient orders     Significant med changes from prior encounter (inform family/care partners about these prior to discharge). Lisinopril decreased from 20mg  to 10mg   Home janumet held     Other       Clinically significant medication issues were identified that warrant physician communication and completion of prescribed/recommended actions by midnight of the next day:  No      Time spent performing this drug regimen review (minutes): 20 min  , PharmD, BCPS, [facility] [employer] Clinical Pharmacist  Please check AMION for all [facility] phone  numbers After 10:00 PM, call Main Pharmacy [phone]

## 2021-09-02 NOTE — Progress Notes (Signed)
Report called to CIR at this time. Questions and concerns answered.

## 2021-09-02 NOTE — Progress Notes (Signed)
Dr Lajoyce Corners was paged at this time for patient's surgical site bleeding. Order received to remove the wound vac dressing and apply 4*4s, ABD and ACE wrap dry dressing. Will follow the order.

## 2021-09-02 NOTE — H&P (Shared)
Physical Medicine and Rehabilitation Admission H&P    Chief Complaint  Patient presents with   Functional deficits     L-BKA and super morbid obesity.     HPI: Adam Long is a 45 year old male with history of T2DM, HTN, mechanical AVR/non-obst CAD-on coumadin, diabetic foot ulcer with gangrenous changes, subacute osteomyelitis s/p ray amputation 06/22 who was admitted on 08/27/21 with one week history of increase in edema and erythema, progressive drainage and some chills with nausea. He was transitioned to IV heparin in anticipation of surgery as foot not felt to be viable and he was started on broad spectrum antibiotics. He underwent BKA by Dr. Lajoyce Corners on 12/02. Blood cultures done showed MSSA bacteremia and TEE done which was negative for endocarditis. Dr. Earlene Plater recommends felt that bacteremia secondary to foot infection and antibiotics narrowed to cefazolin with end date of 09/24/21 to complete 4 weeks antibiotic regimen. Therapy has been ongoing and patient has had decline in ability to complete ADLs and decline in mobility with fatigue. CIR recommended due to functional decline.     Review of Systems  Constitutional:  Negative for chills and fever.  HENT:  Positive for hearing loss and tinnitus (constant).   Eyes:  Negative for blurred vision and double vision.  Respiratory:  Negative for cough and shortness of breath.   Cardiovascular:  Positive for leg swelling (LLE). Negative for chest pain and palpitations.  Gastrointestinal:  Negative for abdominal pain, constipation and heartburn.  Genitourinary:  Negative for dysuria and urgency.  Musculoskeletal:  Positive for back pain (from lying in bed). Negative for myalgias and neck pain.  Skin:  Negative for rash.  Neurological:  Positive for sensory change (phantom pain) and weakness. Negative for dizziness and headaches.  Psychiatric/Behavioral:  The patient does not have insomnia.     Past Medical History:  Diagnosis Date    Anxiety    Aortic stenosis    Cellulitis and abscess of lower extremity 06/11/2019   Cellulitis of fourth toe of left foot    Cholelithiasis    Coronary artery disease    Nonobstructive CAD (40-50% LAD) 08/2019   Depression    Depression    Phreesia 09/27/2020   Depression    Phreesia 11/01/2020   Diabetes mellitus without complication (HCC)    Phreesia 09/27/2020   Elevated troponin level not due myocardial infarction 11/11/2019   Essential hypertension    Gangrene of toe of left foot (HCC) 07/06/2019   Heart murmur    Phreesia 09/27/2020   Hyperlipidemia    Phreesia 09/27/2020   Hypertension    Phreesia 09/27/2020   Mixed hyperlipidemia    Morbid obesity (HCC)    S/P aortic valve replacement with mechanical valve 12/05/2019   25 mm Carbomedics top hat bileaflet mechanical valve via partial upper hemi-sternotomy   Severe aortic stenosis 09/24/2019   Type 2 diabetes mellitus (HCC)     Past Surgical History:  Procedure Laterality Date   ABDOMINAL AORTOGRAM W/LOWER EXTREMITY N/A 07/06/2019   Procedure: ABDOMINAL AORTOGRAM W/LOWER EXTREMITY;  Surgeon: Sherren Kerns, MD;  Location: [facility] INVASIVE CV LAB;  Service: Cardiovascular;  Laterality: N/A;  Bilateral   AMPUTATION Left 07/09/2019   Procedure: LEFT FOURTH and Fifth TOE AMPUTATION.;  Surgeon: Larina Earthly, MD;  Location[facility] OR;  Service: Vascular;  Laterality: Left;   AMPUTATION Left 03/11/2021   Procedure: LEFT FOOT 5TH  AND 4TH RAY AMPUTATION;  Surgeon: Nadara Mustard, MD;  Location: [facility]  OR;  Service: Orthopedics;  Laterality: Left;   AMPUTATION Left 08/28/2021   Procedure: AMPUTATION BELOW KNEE;  Surgeon: Nadara Mustard, MD;  Location: [facility] OR;  Service: Orthopedics;  Laterality: Left;   AORTIC VALVE REPLACEMENT N/A 12/05/2019   Procedure: PARTIAL STERNOTOMY FOR AORTIC VALVE REPLACEMENT (AVR), USING CARBOMEDICS SUPRA-ANNULAR TOP HAT ;  Surgeon: Purcell Nails, MD;  Location: [facility] OR;  Service: Open Heart Surgery;   Laterality: N/A;  No neck lines on left   CARDIAC VALVE REPLACEMENT N/A    Phreesia 09/27/2020   IR RADIOLOGY PERIPHERAL GUIDED IV START  10/05/2019   IR US GUIDE VASC ACCESS RIGHT  10/05/2019   MULTIPLE EXTRACTIONS WITH ALVEOLOPLASTY N/A 10/26/2019   Procedure: EXTRACTION OF TOOTH #'S 3, 5-11,19-28,  AND 32 WITH ALVEOLOPLASTY;  Surgeon: Charlynne Pander, DDS;  Location: [facility] OR;  Service: Oral Surgery;  Laterality: N/A;   RIGHT HEART CATH AND CORONARY ANGIOGRAPHY N/A 09/24/2019   Procedure: RIGHT HEART CATH AND CORONARY ANGIOGRAPHY;  Surgeon: Tonny Bollman, MD;  Location: [facility] INVASIVE CV LAB;  Service: Cardiovascular;  Laterality: N/A;   TEE WITHOUT CARDIOVERSION N/A 12/05/2019   Procedure: TRANSESOPHAGEAL ECHOCARDIOGRAM (TEE);  Surgeon: Purcell Nails, MD;  Location: [facility] OR;  Service: Open Heart Surgery;  Laterality: N/A;    Family History  Problem Relation Age of Onset   Cancer Mother        Brain   Heart disease Father    Hyperlipidemia Father    Hypertension Father    Stroke Father    Heart murmur Sister     Social History:  Lives with uncle. Was works at [employer]. He reports that he has never smoked. He quit smokeless tobacco use about 23 months ago.  His smokeless tobacco use included chew. He reports that he does not currently use alcohol. He reports that he does not use drugs.   Allergies  Allergen Reactions   Pollen Extract Other (See Comments)    Runny nose, watery eyes, sneezing    Medications Prior to Admission  Medication Sig Dispense Refill   atorvastatin (LIPITOR) 80 MG tablet Take 1 tablet (80 mg total) by mouth daily. 90 tablet 3   insulin glargine (LANTUS SOLOSTAR) 100 UNIT/ML Solostar Pen Inject 30 Units into the skin at bedtime. 30 mL 3   lisinopril (ZESTRIL) 20 MG tablet Take 1 tablet by mouth once daily 90 tablet 0   metoprolol tartrate (LOPRESSOR) 25 MG tablet Take 1 tablet by mouth twice daily 180 tablet 0   sertraline (ZOLOFT) 50 MG tablet Take 1  tablet by mouth once daily 90 tablet 1   sitaGLIPtin-metformin (JANUMET) 50-500 MG tablet Take 1 tablet by mouth 2 (two) times daily with a meal. 180 tablet 3   warfarin (COUMADIN) 10 MG tablet Take 1 tablet daily. 45 tablet 5    Drug Regimen Review { DRUG REGIMEN [id number]}  Home: Home Living Family/patient expects to be discharged to:: Private residence Living Arrangements: Other relatives (lives with uncle) Available Help at Discharge: Family, Network engineer, Available PRN/intermittently Type of Home: House Home Access: Stairs to enter Secretary/administrator of Steps: 2-3 Entrance Stairs-Rails: Left Home Layout: Two level, Able to live on main level with bedroom/bathroom Alternate Level Stairs-Number of Steps: 10 to 15 Alternate Level Stairs-Rails: Right, Left, Can reach both Bathroom Shower/Tub: Tub/shower unit Bathroom Toilet: Handicapped height Bathroom Accessibility: Yes Home Equipment: Agricultural consultant (2 wheels), [employer] - quad  Lives With: Family (uncle)   Functional History: Prior Function Prior Level  of Function : Independent/Modified Independent, Working/employed, Driving 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 Status:  Mobility: Bed Mobility Overal bed mobility: Needs Assistance Bed Mobility: Sit to Supine, Supine to Sit Supine to sit: Supervision Sit to supine: Supervision General bed mobility comments: +rail, increased time Transfers Overall transfer level: Needs assistance Equipment used: Rolling walker (2 wheels) Transfers: Sit to/from Stand, Bed to chair/wheelchair/BSC Sit to Stand: Min assist Bed to/from chair/wheelchair/BSC transfer type:: Stand pivot Stand pivot transfers: Mod assist General transfer comment: cues for technique, assist to rise and steady Ambulation/Gait Ambulation/Gait assistance: Min guard, Min assist, +2 safety/equipment Gait Distance (Feet): 5 Feet (+6') Assistive device: Rolling walker (2 wheels) General Gait  Details: Hop to gait with short hops and heavy reliance on UEs. 1 seated rest break due to fatigue. Gait velocity: decreased    ADL: ADL Overall ADL's : Needs assistance/impaired Eating/Feeding: Independent, Sitting Grooming: Set up, Sitting, Wash/dry hands, Wash/dry face Upper Body Bathing: Set up, Sitting Lower Body Bathing: Maximal assistance, Sitting/lateral leans Upper Body Dressing : Set up, Sitting Lower Body Dressing: Moderate assistance Lower Body Dressing Details (indicate cue type and reason): Able to don sock on R foot at EOB with bed in lowest setting and RLE half-propped on bed. Assist to don underwear for managing wound vac and technique. Also requiring assist to stand and maintain balance to pull underwear over hips. Toilet Transfer: Moderate assistance, Stand-pivot, BSC/3in1, Rolling walker (2 wheels) Toileting- Clothing Manipulation and Hygiene: Maximal assistance, Sitting/lateral lean  Cognition: Cognition Overall Cognitive Status: Within Functional Limits for tasks assessed Orientation Level: Oriented X4 Cognition Arousal/Alertness: Awake/alert Behavior During Therapy: WFL for tasks assessed/performed Overall Cognitive Status: Within Functional Limits for tasks assessed   Blood pressure 106/69, pulse 80, temperature 98.2 F (36.8 C), resp. rate 18, height 5\' 10"  (1.778 m), weight (!) 172.4 kg, SpO2 96 %. Physical Exam Vitals and nursing note reviewed.  Constitutional:      Appearance: Normal appearance.  Abdominal:     General: Abdomen is flat.     Palpations: Abdomen is soft.  Neurological:     Mental Status: He is alert and oriented to person, place, and time.    Results for orders placed or performed during the hospital encounter of 08/27/21 (from the past 48 hour(s))  Glucose, capillary     Status: Abnormal   Collection Time: 08/31/21 12:55 PM  Result Value Ref Range   Glucose-Capillary 150 (H) 70 - 99 mg/dL    Comment: Glucose reference range  applies only to samples taken after fasting for at least 8 hours.  Low molecular wgt heparin (fractionated)     Status: None   Collection Time: 08/31/21 12:56 PM  Result Value Ref Range   Heparin LMW 0.89 IU/mL    Comment:        THERAPEUTIC RANGE: DVT,PE,ACS on LMWH 1 mg/kg q12 at 4 hrs = 0.5-1.2 units/mL. DVT,PE on LMWH 1.5 mg/kg q24 at 4 hrs = 1-2 units/mL. Performed at [facility], [address] [place], [place]   Glucose, capillary     Status: Abnormal   Collection Time: 08/31/21  5:15 PM  Result Value Ref Range   Glucose-Capillary 128 (H) 70 - 99 mg/dL    Comment: Glucose reference range applies only to samples taken after fasting for at least 8 hours.  Glucose, capillary     Status: Abnormal   Collection Time: 08/31/21  8:33 PM  Result Value Ref Range   Glucose-Capillary  176 (H) 70 - 99 mg/dL    Comment: Glucose reference range applies only to samples taken after fasting for at least 8 hours.  Protime-INR     Status: Abnormal   Collection Time: 09/01/21  1:55 AM  Result Value Ref Range   Prothrombin Time 17.4 (H) 11.4 - 15.2 seconds   INR 1.4 (H) 0.8 - 1.2    Comment: (NOTE) INR goal varies based on device and disease states. Performed at [facility], [address] [place], [place] [address]   Glucose, capillary     Status: Abnormal   Collection Time: 09/01/21 12:29 PM  Result Value Ref Range   Glucose-Capillary 120 (H) 70 - 99 mg/dL    Comment: Glucose reference range applies only to samples taken after fasting for at least 8 hours.  Glucose, capillary     Status: Abnormal   Collection Time: 09/01/21  2:44 PM  Result Value Ref Range   Glucose-Capillary 107 (H) 70 - 99 mg/dL    Comment: Glucose reference range applies only to samples taken after fasting for at least 8 hours.  Glucose, capillary     Status: Abnormal   Collection Time: 09/01/21  5:05 PM  Result Value Ref Range   Glucose-Capillary 140 (H) 70 - 99 mg/dL    Comment: Glucose  reference range applies only to samples taken after fasting for at least 8 hours.  Glucose, capillary     Status: Abnormal   Collection Time: 09/01/21  9:20 PM  Result Value Ref Range   Glucose-Capillary 139 (H) 70 - 99 mg/dL    Comment: Glucose reference range applies only to samples taken after fasting for at least 8 hours.  Creatinine, serum     Status: None   Collection Time: 09/02/21  5:09 AM  Result Value Ref Range   Creatinine, Ser 0.65 0.61 - 1.24 mg/dL   GFR, Estimated >88 >50 mL/min    Comment: (NOTE) Calculated using the CKD-EPI Creatinine Equation (2021) Performed at [facility], [address] [place], [place] [address]   Protime-INR     Status: Abnormal   Collection Time: 09/02/21  5:09 AM  Result Value Ref Range   Prothrombin Time 18.8 (H) 11.4 - 15.2 seconds   INR 1.6 (H) 0.8 - 1.2    Comment: (NOTE) INR goal varies based on device and disease states. Performed at [facility], [address] [place], [place] [address]   Glucose, capillary     Status: Abnormal   Collection Time: 09/02/21  7:36 AM  Result Value Ref Range   Glucose-Capillary 142 (H) 70 - 99 mg/dL    Comment: Glucose reference range applies only to samples taken after fasting for at least 8 hours.  CBC     Status: Abnormal   Collection Time: 09/02/21  9:50 AM  Result Value Ref Range   WBC 14.3 (H) 4.0 - 10.5 K/uL   RBC 4.26 4.22 - 5.81 MIL/uL   Hemoglobin 11.4 (L) 13.0 - 17.0 g/dL   HCT 76.7 (L) 20.9 - 47.0 %   MCV 85.0 80.0 - 100.0 fL   MCH 26.8 26.0 - 34.0 pg   MCHC 31.5 30.0 - 36.0 g/dL   RDW 96.2 83.6 - 62.9 %   Platelets 407 (H) 150 - 400 K/uL   nRBC 0.0 0.0 - 0.2 %    Comment: Performed at [facility], [address], [place], [place] [address]   ECHO TEE  Result Date: 09/01/2021    TRANSESOPHOGEAL ECHO REPORT   Patient Name:   TYREESE THAIN Date of Exam: 09/01/2021 Medical Rec #:  [id number]       Height:       70.0 in Accession #:    [id number]      Weight:        380.1 lb Date of Birth:  [birth year]       BSA:          2.742 m Patient Age:    45 years        BP:           106/58 mmHg Patient Gender: M               HR:           101 bpm. Exam Location:  Inpatient Procedure: 3D Echo, Cardiac Doppler and Color Doppler Indications:     Bacteremia. I35.8 Other nonrheumatic aortic valve disorders  History:         Patient has prior history of Echocardiogram examinations, most                  recent 08/29/2021.  Sonographer:     Margreta Journey RDCS Referring Phys:  [id number] Manson Passey Diagnosing Phys: Epifanio Lesches MD PROCEDURE: After discussion of the risks and benefits of a TEE, an informed consent was obtained from the patient. The transesophogeal probe was passed without difficulty through the esophogus of the patient. Imaged were obtained with the patient in a left lateral decubitus position. Sedation performed by different physician. The patient was monitored while under deep sedation. Anesthestetic sedation was provided intravenously by Anesthesiology:  of Propofol,  of Lidocaine. Image quality was  adequate. The patient developed no complications during the procedure. IMPRESSIONS  1. Left ventricular ejection fraction, by estimation, is 60 to 65%. The left ventricle has normal function. The left ventricle has no regional wall motion abnormalities.  2. Right ventricular systolic function is normal. The right ventricular size is mildly enlarged.  3. No left atrial/left atrial appendage thrombus was detected.  4. The mitral valve is normal in structure. Trivial mitral valve regurgitation.  5. There is a 25 mm bileaflet valve present in the aortic position     Aortic valve regurgitation is not visualized. Echo findings are consistent with normal structure and function of the aortic valve prosthesis. Aortic valve mean gradient measures 13.0 mmHg. Conclusion(s)/Recommendation(s): No vegetation seen. Pulmonic valve poorly visualized due to shadowing  from mechanical aortic valve. No clear vegetation on mechanical aortic valve. Would monitor serial echocardiogram while treating bacteremia if clinical  suspicion for endocarditis. FINDINGS  Left Ventricle: Left ventricular ejection fraction, by estimation, is 60 to 65%. The left ventricle has normal function. The left ventricle has no regional wall motion abnormalities. The left ventricular internal cavity size was normal in size. Right Ventricle: The right ventricular size is mildly enlarged. Right vetricular wall thickness was not well visualized. Right ventricular systolic function is normal. Left Atrium: Left atrial size was normal in size. No left atrial/left atrial appendage thrombus was detected. Right Atrium: Right atrial size was normal in size. Pericardium: There is no evidence of pericardial effusion. Mitral Valve: The mitral valve is normal in structure. Trivial mitral valve regurgitation. Tricuspid Valve: The tricuspid valve is normal in structure. Tricuspid valve regurgitation is trivial. Aortic Valve: The aortic valve has been repaired/replaced. Aortic valve regurgitation is not visualized. Aortic valve mean gradient measures 13.0 mmHg. There is  a 25 mm bileaflet valve present in the aortic position. Echo findings are consistent with normal structure and function of the aortic valve prosthesis. Pulmonic Valve: The pulmonic valve was not well visualized. Pulmonic valve regurgitation is trivial. Aorta: The aortic root and ascending aorta are structurally normal, with no evidence of dilitation. IAS/Shunts: No atrial level shunt detected by color flow Doppler.  AORTIC VALVE AV Mean Grad: 13.0 mmHg Epifanio Lesches MD Electronically signed by Epifanio Lesches MD Signature Date/Time: 09/01/2021/1:19:23 PM    Final    [place] EKG SITE RITE  Result Date: 09/01/2021 If Site Rite image not attached, placement could not be confirmed due to current cardiac rhythm.      Medical Problem List and  Plan: 1. Functional deficits secondary to ***  -patient may *** shower  -ELOS/Goals: *** 2.  Mechanical Aortic valve/Antithrombotics: -DVT/anticoagulation:  Pharmaceutical: Coumadin with lovenox bridge till INR>2.5  -antiplatelet therapy: N/A 3. Pain Management: Oxycodone prn.  4. Mood: LCSW to follow for evaluation and support.   -antipsychotic agents: N/A 5. Neuropsych: This patient is capable of making decisions on his own behalf. 6. Skin/Wound Care: Routine pressure relief measures.   --wound VAC come off POD# 7 on 12/09 7. Fluids/Electrolytes/Nutrition: Monitor I/O. Check CMET in am.  8. Sepsis secondary to MSSA bacteremia: ON IV cefazolin with end date 09/24/21.  --weekly labs 9. T2DM: Uncontrolled with A1C-7.8. Monitor BS ac/hs and use SSI for elevated BS  --continue to hold Janumet as BS controlled on current regimen.    --continue insulin glargine 15 units (was on 30 units PTA) with SSI for elevated BS.   10. HTN: Monitor BP TID--has been running low  --Lisinopril 10 mg with metoprolol bid. Monitor for orthostatic changes.  11. Super morbid obesity: BMI 54.5. will consult dietician to educate on diet --Educate on weight loss and dietary changes to help promote overall health and mobility.  12. Hyponatremia: Recheck lytes in am.  13. Malnutrition: Albumin has dropped from 3.2-->2.7 today.  --add protein supplement TID. 14. Leucocytosis:Has been fluctuating 15.3-->12.3-->14.3 --Monitor for fevers and trends.     ***  Jacquelynn Cree, PA-C 09/02/2021

## 2021-09-02 NOTE — H&P (Signed)
Physical Medicine and Rehabilitation Admission H&P    Chief Complaint  Patient presents with   Functional deficits     L-BKA and super morbid obesity.     HPI: Adam Long is a 45 year old male with history of T2DM, HTN, mechanical AVR/non-obst CAD-on coumadin, diabetic foot ulcer with gangrenous changes, subacute osteomyelitis s/p ray amputation 06/22 who was admitted on 08/27/21 with one week history of increase in edema and erythema, progressive drainage and some chills with nausea. He was transitioned to IV heparin in anticipation of surgery as foot not felt to be viable and he was started on broad spectrum antibiotics. He underwent BKA by Dr. Sharol Given on 12/02. Blood cultures done showed MSSA bacteremia and TEE done which was negative for endocarditis. Dr. Juleen China recommends felt that bacteremia secondary to foot infection and antibiotics narrowed to cefazolin with end date of 09/24/21 to complete 4 weeks antibiotic regimen. Therapy has been ongoing and patient has had decline in ability to complete ADLs and decline in mobility with fatigue. CIR recommended due to functional decline.  Currently complains of left lower extremity swelling.    Review of Systems  Constitutional:  Negative for chills and fever.  HENT:  Positive for hearing loss and tinnitus (constant).   Eyes:  Negative for blurred vision and double vision.  Respiratory:  Negative for cough and shortness of breath.   Cardiovascular:  Positive for leg swelling (LLE). Negative for chest pain and palpitations.  Gastrointestinal:  Negative for abdominal pain, constipation and heartburn.  Genitourinary:  Negative for dysuria and urgency.  Musculoskeletal:  Positive for back pain (from lying in bed). Negative for myalgias and neck pain.  Skin:  Negative for rash.  Neurological:  Positive for sensory change (phantom pain) and weakness. Negative for dizziness and headaches.  Psychiatric/Behavioral:  The patient does not have  insomnia.     Past Medical History:  Diagnosis Date   Anxiety    Aortic stenosis    Cellulitis and abscess of lower extremity 06/11/2019   Cellulitis of fourth toe of left foot    Cholelithiasis    Coronary artery disease    Nonobstructive CAD (40-50% LAD) 08/2019   Depression    Depression    Phreesia 09/27/2020   Depression    Phreesia 11/01/2020   Diabetes mellitus without complication ([place])    Phreesia 09/27/2020   Elevated troponin level not due myocardial infarction 11/11/2019   Essential hypertension    Gangrene of toe of left foot ([place]) 07/06/2019   Heart murmur    Phreesia 09/27/2020   Hyperlipidemia    Phreesia 09/27/2020   Hypertension    Phreesia 09/27/2020   Mixed hyperlipidemia    Morbid obesity ([place])    S/P aortic valve replacement with mechanical valve 12/05/2019   25 mm Carbomedics top hat bileaflet mechanical valve via partial upper hemi-sternotomy   Severe aortic stenosis 09/24/2019   Type 2 diabetes mellitus ([place])     Past Surgical History:  Procedure Laterality Date   ABDOMINAL AORTOGRAM W/LOWER EXTREMITY N/A 07/06/2019   Procedure: ABDOMINAL AORTOGRAM W/LOWER EXTREMITY;  Surgeon: Elam Dutch, MD;  Location: [place] [facility];  Service: Cardiovascular;  Laterality: N/A;  Bilateral   AMPUTATION Left 07/09/2019   Procedure: LEFT FOURTH and Fifth TOE AMPUTATION.;  Surgeon: Rosetta Posner, MD;  Location: [facility] OR;  Service: Vascular;  Laterality: Left;   AMPUTATION Left 03/11/2021   Procedure: LEFT FOOT 5TH  AND 4TH RAY AMPUTATION;  Surgeon:  Newt Minion, MD;  Location: [place];  Service: Orthopedics;  Laterality: Left;   AMPUTATION Left 08/28/2021   Procedure: AMPUTATION BELOW KNEE;  Surgeon: Newt Minion, MD;  Location: [place];  Service: Orthopedics;  Laterality: Left;   AORTIC VALVE REPLACEMENT N/A 12/05/2019   Procedure: PARTIAL STERNOTOMY FOR AORTIC VALVE REPLACEMENT (AVR), USING CARBOMEDICS SUPRA-ANNULAR TOP HAT 25MM;  Surgeon: Rexene Alberts,  MD;  Location: [place];  Service: Open Heart Surgery;  Laterality: N/A;  No neck lines on left   CARDIAC VALVE REPLACEMENT N/A    Phreesia 09/27/2020   IR RADIOLOGY PERIPHERAL GUIDED IV START  10/05/2019   IR US GUIDE VASC ACCESS RIGHT  10/05/2019   MULTIPLE EXTRACTIONS WITH ALVEOLOPLASTY N/A 10/26/2019   Procedure: EXTRACTION OF TOOTH #'S 3, 5-11,19-28,  AND 32 WITH ALVEOLOPLASTY;  Surgeon: Lenn Cal, DDS;  Location: [place];  Service: Oral Surgery;  Laterality: N/A;   RIGHT HEART CATH AND CORONARY ANGIOGRAPHY N/A 09/24/2019   Procedure: RIGHT HEART CATH AND CORONARY ANGIOGRAPHY;  Surgeon: Sherren Mocha, MD;  Location: [place] [facility];  Service: Cardiovascular;  Laterality: N/A;   TEE WITHOUT CARDIOVERSION N/A 12/05/2019   Procedure: TRANSESOPHAGEAL ECHOCARDIOGRAM (TEE);  Surgeon: Rexene Alberts, MD;  Location: [place];  Service: Open Heart Surgery;  Laterality: N/A;    Family History  Problem Relation Age of Onset   Cancer Mother        Brain   Heart disease Father    Hyperlipidemia Father    Hypertension Father    Stroke Father    Heart murmur Sister     Social History:  Lives with uncle. Was works at [employer]. He reports that he has never smoked. He quit smokeless tobacco use about 23 months ago.  His smokeless tobacco use included chew. He reports that he does not currently use alcohol. He reports that he does not use drugs.   Allergies  Allergen Reactions   Pollen Extract Other (See Comments)    Runny nose, watery eyes, sneezing    Medications Prior to Admission  Medication Sig Dispense Refill   atorvastatin (LIPITOR) 80 MG tablet Take 1 tablet (80 mg total) by mouth daily. 90 tablet 3   ceFAZolin (ANCEF) IVPB Inject 2 g into the vein every 8 (eight) hours for 22 days. Indication:  MSSA + GBS bacteremia First Dose: Yes Last Day of Therapy:  09/24/21 Labs - Once weekly:  CBC/D and BMP, Labs - Every other week:  ESR and CRP Method of administration: IV  Push Method of administration may be changed at the discretion of home infusion pharmacist based upon assessment of the patient and/or caregiver's ability to self-administer the medication ordered. 22 Units 0   enoxaparin (LOVENOX) 150 MG/ML injection Inject 1 mL (150 mg total) into the skin every 12 (twelve) hours. 0 mL    insulin glargine (LANTUS SOLOSTAR) 100 UNIT/ML Solostar Pen Inject 30 Units into the skin at bedtime. 30 mL 3   [START ON 09/03/2021] lisinopril (ZESTRIL) 10 MG tablet Take 1 tablet (10 mg total) by mouth daily.     metoprolol tartrate (LOPRESSOR) 25 MG tablet Take 1 tablet by mouth twice daily 180 tablet 0   oxyCODONE 10 MG TABS Take 1-1.5 tablets (10-15 mg total) by mouth every 4 (four) hours as needed for severe pain (pain score 7-10). 30 tablet 0   [START ON 09/03/2021] pantoprazole (PROTONIX) 40 MG tablet Take 1 tablet (40 mg total) by mouth daily.  sertraline (ZOLOFT) 50 MG tablet Take 1 tablet by mouth once daily 90 tablet 1   sitaGLIPtin-metformin (JANUMET) 50-500 MG tablet Take 1 tablet by mouth 2 (two) times daily with a meal. 180 tablet 3   warfarin (COUMADIN) 2.5 MG tablet Take 5 tablets (12.5 mg total) by mouth one time only at 4 PM.      Drug Regimen Review  Drug regimen was reviewed and remains appropriate with no significant issues identified  Home: Home Living Family/patient expects to be discharged to:: Private residence Living Arrangements: Other relatives (lives with uncle) Available Help at Discharge: Family, Industrial/product designer, Available PRN/intermittently Type of Home: House Home Access: Stairs to enter Technical brewer of Steps: 2-3 Entrance Stairs-Rails: Left Home Layout: Two level, Able to live on main level with bedroom/bathroom Alternate Level Stairs-Number of Steps: 10 to 15 Alternate Level Stairs-Rails: Right, Left, Can reach both Bathroom Shower/Tub: Tub/shower unit Bathroom Toilet: Handicapped height Bathroom Accessibility: Yes Home  Equipment: Conservation officer, nature (2 wheels), [employer] - quad  Lives With: Family (uncle)   Functional History: Prior Function Prior Level of Function : Independent/Modified Independent, Working/employed, 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 Status:  Mobility: Bed Mobility Overal bed mobility: Needs Assistance Bed Mobility: Sit to Supine, Supine to Sit Supine to sit: Supervision Sit to supine: Supervision General bed mobility comments: +rail, increased time Transfers Overall transfer level: Needs assistance Equipment used: Rolling walker (2 wheels) Transfers: Sit to/from Stand, Bed to chair/wheelchair/BSC Sit to Stand: Min assist Bed to/from chair/wheelchair/BSC transfer type:: Stand pivot Stand pivot transfers: Mod assist General transfer comment: cues for technique, assist to rise and steady Ambulation/Gait Ambulation/Gait assistance: Min guard, Min assist, +2 safety/equipment Gait Distance (Feet): 5 Feet (+6') Assistive device: Rolling walker (2 wheels) General Gait Details: Hop to gait with short hops and heavy reliance on UEs. 1 seated rest break due to fatigue. Gait velocity: decreased   ADL: ADL Overall ADL's : Needs assistance/impaired Eating/Feeding: Independent, Sitting Grooming: Set up, Sitting, Wash/dry hands, Wash/dry face Upper Body Bathing: Set up, Sitting Lower Body Bathing: Maximal assistance, Sitting/lateral leans Upper Body Dressing : Set up, Sitting Lower Body Dressing: Moderate assistance Lower Body Dressing Details (indicate cue type and reason): Able to don sock on R foot at EOB with bed in lowest setting and RLE half-propped on bed. Assist to don underwear for managing wound vac and technique. Also requiring assist to stand and maintain balance to pull underwear over hips. Toilet Transfer: Moderate assistance, Stand-pivot, BSC/3in1, Rolling walker (2 wheels) Toileting- Clothing Manipulation and Hygiene: Maximal assistance,  Sitting/lateral lean   Cognition: Cognition Overall Cognitive Status: Within Functional Limits for tasks assessed Orientation Level: Oriented X4 Cognition Arousal/Alertness: Awake/alert Behavior During Therapy: WFL for tasks assessed/performed Overall Cognitive Status: Within Functional Limits for tasks assessed  Blood pressure 94/80, pulse 95, temperature 98.9 F (37.2 C), temperature source Oral, resp. rate 20, height '5\' 10"'  (1.778 m), SpO2 100 %. Physical Exam Gen: no distress, normal appearing HEENT: oral mucosa pink and moist, NCAT Cardio: Reg rate Chest: normal effort, normal rate of breathing Abd: soft, non-distended Ext: no edema Psych: pleasant, normal affect Skin: see MSK Musculoskeletal: Left BKA sutures and staples C/D/I with minimal bleeding   Results for orders placed or performed during the hospital encounter of 09/02/21 (from the past 48 hour(s))  Glucose, capillary     Status: Abnormal   Collection Time: 09/02/21  4:19 PM  Result Value Ref Range   Glucose-Capillary 139 (H) 70 -  99 mg/dL    Comment: Glucose reference range applies only to samples taken after fasting for at least 8 hours.   ECHO TEE  Result Date: 09/01/2021    TRANSESOPHOGEAL ECHO REPORT   Patient Name:   Adam Long Date of Exam: 09/01/2021 Medical Rec #:  [id number]       Height:       70.0 in Accession #:    [id number]      Weight:       380.1 lb Date of Birth:  07-18-1976       BSA:          2.742 m Patient Age:    33 years        BP:           106/58 mmHg Patient Gender: M               HR:           101 bpm. Exam Location:  Inpatient Procedure: 3D Echo, Cardiac Doppler and Color Doppler Indications:     Bacteremia. I35.8 Other nonrheumatic aortic valve disorders  History:         Patient has prior history of Echocardiogram examinations, most                  recent 08/29/2021.  Sonographer:     Philipp Deputy RDCS Referring Phys:  [id number] Leanor Kail Diagnosing Phys: Oswaldo Milian MD PROCEDURE: After discussion of the risks and benefits of a TEE, an informed consent was obtained from the patient. The transesophogeal probe was passed without difficulty through the esophogus of the patient. Imaged were obtained with the patient in a left lateral decubitus position. Sedation performed by different physician. The patient was monitored while under deep sedation. Anesthestetic sedation was provided intravenously by Anesthesiology: 966m of Propofol, 1030mof Lidocaine. Image quality was  adequate. The patient developed no complications during the procedure. IMPRESSIONS  1. Left ventricular ejection fraction, by estimation, is 60 to 65%. The left ventricle has normal function. The left ventricle has no regional wall motion abnormalities.  2. Right ventricular systolic function is normal. The right ventricular size is mildly enlarged.  3. No left atrial/left atrial appendage thrombus was detected.  4. The mitral valve is normal in structure. Trivial mitral valve regurgitation.  5. There is a 25 mm bileaflet valve present in the aortic position     Aortic valve regurgitation is not visualized. Echo findings are consistent with normal structure and function of the aortic valve prosthesis. Aortic valve mean gradient measures 13.0 mmHg. Conclusion(s)/Recommendation(s): No vegetation seen. Pulmonic valve poorly visualized due to shadowing from mechanical aortic valve. No clear vegetation on mechanical aortic valve. Would monitor serial echocardiogram while treating bacteremia if clinical  suspicion for endocarditis. FINDINGS  Left Ventricle: Left ventricular ejection fraction, by estimation, is 60 to 65%. The left ventricle has normal function. The left ventricle has no regional wall motion abnormalities. The left ventricular internal cavity size was normal in size. Right Ventricle: The right ventricular size is mildly enlarged. Right vetricular wall thickness was not well visualized. Right  ventricular systolic function is normal. Left Atrium: Left atrial size was normal in size. No left atrial/left atrial appendage thrombus was detected. Right Atrium: Right atrial size was normal in size. Pericardium: There is no evidence of pericardial effusion. Mitral Valve: The mitral valve is normal in structure. Trivial mitral valve regurgitation. Tricuspid Valve: The tricuspid valve is normal in structure. Tricuspid valve  regurgitation is trivial. Aortic Valve: The aortic valve has been repaired/replaced. Aortic valve regurgitation is not visualized. Aortic valve mean gradient measures 13.0 mmHg. There is a 25 mm bileaflet valve present in the aortic position. Echo findings are consistent with normal structure and function of the aortic valve prosthesis. Pulmonic Valve: The pulmonic valve was not well visualized. Pulmonic valve regurgitation is trivial. Aorta: The aortic root and ascending aorta are structurally normal, with no evidence of dilitation. IAS/Shunts: No atrial level shunt detected by color flow Doppler.  AORTIC VALVE AV Mean Grad: 13.0 mmHg Oswaldo Milian MD Electronically signed by Oswaldo Milian MD Signature Date/Time: 09/01/2021/1:19:23 PM    Final    [place] EKG SITE RITE  Result Date: 09/01/2021 If Site Rite image not attached, placement could not be confirmed due to current cardiac rhythm.      Medical Problem List and Plan: 1. Functional deficits secondary to L BKA  -patient may shower but incision must be covered  -ELOS/Goals: 10-14 days S 2.  Mechanical Aortic valve/Antithrombotics: -DVT/anticoagulation:  Pharmaceutical: Coumadin with lovenox bridge till INR>2.5  -antiplatelet therapy: N/A 3. Residual limb pain: continue Oxycodone prn.  4. Mood: LCSW to follow for evaluation and support.   -antipsychotic agents: N/A 5. Neuropsych: This patient is capable of making decisions on his own behalf. 6. Skin/Wound Care: Routine pressure relief measures.   --wound VAC come  off POD# 7 on 12/09 7. Fluids/Electrolytes/Nutrition: Monitor I/O. Check CMET in am.  8. Sepsis secondary to MSSA bacteremia: continue IV cefazolin with end date 09/24/21.  --weekly labs 9. T2DM: Uncontrolled with A1C-7.8. Monitor BS ac/hs and use SSI for elevated BS  --start metformin 25m daily  --continue insulin glargine 15 units (was on 30 units PTA) with SSI for elevated BS.   10. HTN: Monitor BP TID--has been running low  --Lisinopril 10 mg with metoprolol bid. Monitor for orthostatic changes.  11. Super morbid obesity: BMI 54.5. will consult dietician to educate on diet --Educate on weight loss and dietary changes to help promote overall health and mobility.  12. Hyponatremia: Recheck lytes in am.  13. Malnutrition: Albumin has dropped from 3.2-->2.7 today.  --add protein supplement TID. 14. Leucocytosis:Has been fluctuating 15.3-->12.3-->14.3 --Monitor for fevers and trends.   I have personally performed a face to face diagnostic evaluation, including, but not limited to relevant history and physical exam findings, of this patient and developed relevant assessment and plan.  Additionally, I have reviewed and concur with the physician assistant's documentation above.  KIzora Ribas MD 09/02/2021   PBary Leriche PA-C

## 2021-09-02 NOTE — Progress Notes (Signed)
Occupational Therapy Treatment Patient Details Name: Adam Long MRN: [id number] DOB: May 16, 1976 Today's Date: 09/02/2021   History of present illness Pt is a 45 y.o. male admitted 12/1 with L foot infection. He underwent L BKA 12/2. PMH: diabetes mellitus type 2, mechanical aortic valve replacement on warfarin, coronary disease, hypertension, hyperlipidemia, morbid obesity   OT comments  Pt is slowly progressing towards OT goals. During session, pt required Mod A for LB dressing, Min A for toilet transfer, and Min A for functional mobility (see details below). Pt plans to transfer to AIR later today. Will continue to follow.   Recommendations for follow up therapy are one component of a multi-disciplinary discharge planning process, led by the attending physician.  Recommendations may be updated based on patient status, additional functional criteria and insurance authorization.    Follow Up Recommendations  Acute inpatient rehab (3hours/day)    Assistance Recommended at Discharge Frequent or constant Supervision/Assistance  Equipment Recommendations  BSC/3in1;Wheelchair (measurements OT);Wheelchair cushion (measurements OT)    Recommendations for Other Services      Precautions / Restrictions Precautions Precautions: Fall;Other (comment) Precaution Comments: wound vac Required Braces or Orthoses: Other Brace Other Brace: limb protector Restrictions Weight Bearing Restrictions: Yes LLE Weight Bearing: Non weight bearing Other Position/Activity Restrictions: L limb protector       Mobility Bed Mobility               General bed mobility comments: In recliner    Transfers Overall transfer level: Needs assistance Equipment used: Rolling walker (2 wheels) Transfers: Sit to/from Stand;Bed to chair/wheelchair/BSC Sit to Stand: Min assist                 Balance Overall balance assessment: Needs assistance Sitting-balance support: Feet supported;No upper  extremity supported Sitting balance-Leahy Scale: Good     Standing balance support: During functional activity;Reliant on assistive device for balance Standing balance-Leahy Scale: Poor                             ADL either performed or assessed with clinical judgement   ADL Overall ADL's : Needs assistance/impaired                     Lower Body Dressing: Moderate assistance;Sitting/lateral leans Lower Body Dressing Details (indicate cue type and reason): To don shoe on R LE while seated in recliner Toilet Transfer: Minimal assistance;Cueing for safety;Ambulation;BSC/3in1;Rolling walker (2 wheels);Requires wide/bariatric Toilet Transfer Details (indicate cue type and reason): Bariatric BSC over toilet. Cues for technique and safety         Functional mobility during ADLs: Minimal assistance;Rolling walker (2 wheels);Cueing for safety General ADL Comments: Cues for safety with funcitonal mobility, hopping with RW. Struggling to hop wearing gripper sock at first. Donned shoe and performed much better. Requiring assist to push chair behind and rest breaks every few feet.    Extremity/Trunk Assessment              Vision       Perception     Praxis      Cognition Arousal/Alertness: Awake/alert Behavior During Therapy: WFL for tasks assessed/performed Overall Cognitive Status: Within Functional Limits for tasks assessed  Exercises     Shoulder Instructions       General Comments      Pertinent Vitals/ Pain       Pain Assessment: Faces Faces Pain Scale: Hurts a little bit Pain Location: L residual limb Pain Descriptors / Indicators: Discomfort;Sore Pain Intervention(s): Monitored during session  Home Living                                          Prior Functioning/Environment              Frequency  Min 2X/week        Progress Toward Goals  OT  Goals(current goals can now be found in the care plan section)  Progress towards OT goals: Progressing toward goals  Acute Rehab OT Goals OT Goal Formulation: With patient Time For Goal Achievement: 09/12/21 Potential to Achieve Goals: Good ADL Goals Pt Will Perform Lower Body Bathing: with set-up;sitting/lateral leans Pt Will Perform Lower Body Dressing: with set-up;sitting/lateral leans Pt Will Transfer to Toilet: with supervision;ambulating;bedside commode Pt Will Perform Toileting - Clothing Manipulation and hygiene: sitting/lateral leans;with set-up  Plan Discharge plan remains appropriate;Frequency remains appropriate    Co-evaluation                 AM-PAC OT "6 Clicks" Daily Activity     Outcome Measure   Help from another person eating meals?: None Help from another person taking care of personal grooming?: A Little Help from another person toileting, which includes using toliet, bedpan, or urinal?: A Lot Help from another person bathing (including washing, rinsing, drying)?: A Lot Help from another person to put on and taking off regular upper body clothing?: A Little Help from another person to put on and taking off regular lower body clothing?: A Lot 6 Click Score: 16    End of Session Equipment Utilized During Treatment: Rolling walker (2 wheels);Gait belt  OT Visit Diagnosis: Unsteadiness on feet (R26.81);Pain;Muscle weakness (generalized) (M62.81)   Activity Tolerance Patient tolerated treatment well   Patient Left with call bell/phone within reach;in chair   Nurse Communication Mobility status        Time: 7342-8768 OT Time Calculation (min): 23 min  Charges: OT General Charges $OT Visit: 1 Visit OT Treatments $Self Care/Home Management : 8-22 mins $Therapeutic Activity: 8-22 mins  Marchella Hibbard C, OT/L  Acute Rehab [phone]   Felisa Zechman A Edmonia Gonser 09/02/2021, 2:00 PM

## 2021-09-03 DIAGNOSIS — S88112A Complete traumatic amputation at level between knee and ankle, left lower leg, initial encounter: Secondary | ICD-10-CM | POA: Diagnosis not present

## 2021-09-03 LAB — CBC WITH DIFFERENTIAL/PLATELET
Abs Immature Granulocytes: 0.32 10*3/uL — ABNORMAL HIGH (ref 0.00–0.07)
Basophils Absolute: 0.1 10*3/uL (ref 0.0–0.1)
Basophils Relative: 1 %
Eosinophils Absolute: 0.2 10*3/uL (ref 0.0–0.5)
Eosinophils Relative: 2 %
HCT: 33.3 % — ABNORMAL LOW (ref 39.0–52.0)
Hemoglobin: 10.8 g/dL — ABNORMAL LOW (ref 13.0–17.0)
Immature Granulocytes: 3 %
Lymphocytes Relative: 19 %
Lymphs Abs: 2 10*3/uL (ref 0.7–4.0)
MCH: 27.3 pg (ref 26.0–34.0)
MCHC: 32.4 g/dL (ref 30.0–36.0)
MCV: 84.1 fL (ref 80.0–100.0)
Monocytes Absolute: 0.8 10*3/uL (ref 0.1–1.0)
Monocytes Relative: 7 %
Neutro Abs: 7.3 10*3/uL (ref 1.7–7.7)
Neutrophils Relative %: 68 %
Platelets: 348 10*3/uL (ref 150–400)
RBC: 3.96 MIL/uL — ABNORMAL LOW (ref 4.22–5.81)
RDW: 15.4 % (ref 11.5–15.5)
WBC: 10.7 10*3/uL — ABNORMAL HIGH (ref 4.0–10.5)
nRBC: 0 % (ref 0.0–0.2)

## 2021-09-03 LAB — COMPREHENSIVE METABOLIC PANEL
ALT: 24 U/L (ref 0–44)
AST: 25 U/L (ref 15–41)
Albumin: 2.3 g/dL — ABNORMAL LOW (ref 3.5–5.0)
Alkaline Phosphatase: 58 U/L (ref 38–126)
Anion gap: 5 (ref 5–15)
BUN: 15 mg/dL (ref 6–20)
CO2: 25 mmol/L (ref 22–32)
Calcium: 8.5 mg/dL — ABNORMAL LOW (ref 8.9–10.3)
Chloride: 98 mmol/L (ref 98–111)
Creatinine, Ser: 0.6 mg/dL — ABNORMAL LOW (ref 0.61–1.24)
GFR, Estimated: >60 mL/min (ref >60)
Glucose, Bld: 147 mg/dL — ABNORMAL HIGH (ref 70–99)
Potassium: 4.1 mmol/L (ref 3.5–5.1)
Sodium: 128 mmol/L — ABNORMAL LOW (ref 135–145)
Total Bilirubin: 0.7 mg/dL (ref 0.3–1.2)
Total Protein: 7.3 g/dL (ref 6.5–8.1)

## 2021-09-03 LAB — GLUCOSE, CAPILLARY
Glucose-Capillary: 154 mg/dL — ABNORMAL HIGH (ref 70–99)
Glucose-Capillary: 137 mg/dL — ABNORMAL HIGH (ref 70–99)
Glucose-Capillary: 159 mg/dL — ABNORMAL HIGH (ref 70–99)
Glucose-Capillary: 113 mg/dL — ABNORMAL HIGH (ref 70–99)

## 2021-09-03 LAB — PROTIME-INR
INR: 1.6 — ABNORMAL HIGH (ref 0.8–1.2)
Prothrombin Time: 19.3 seconds — ABNORMAL HIGH (ref 11.4–15.2)

## 2021-09-03 MED ORDER — CHLORHEXIDINE GLUCONATE CLOTH 2 % EX PADS
6.0000 | MEDICATED_PAD | Freq: Every day | CUTANEOUS | Status: DC
  Administered 2021-09-03 – 2021-09-13 (×11): 6 via TOPICAL

## 2021-09-03 MED ORDER — WARFARIN SODIUM 5 MG PO TABS
14.0000 mg | ORAL_TABLET | Freq: Once | ORAL | Status: AC
  Administered 2021-09-03: 14 mg via ORAL
  Filled 2021-09-03: qty 2

## 2021-09-03 NOTE — Consult Note (Signed)
WOC Nurse wound consult note Consultation was completed by review of records, images and assistance from the bedside nurse/clinical staff.   Reason for Consult:MASD Wound type: moisture associated skin damage groin Pressure Injury POA:NA Drainage (amount, consistency, odor) none Periwound:intact  Dressing procedure/placement/frequency:  Added orders for silver antimicrobial wicking fabric to treat the groin MASD.    Re consult if needed, will not follow at this time. Thanks  Daijanae Rafalski [employer], RN,CWOCN, CNS, CWON-AP [phone])

## 2021-09-03 NOTE — Progress Notes (Signed)
Patient ID: Adam Long, male   DOB: 03/25/1976, 45 y.o.   MRN: [id number]  Met with patient in his room, introduced myself, and explained my role in his care. Went over [employer] and its contents. Gave additional Scientist, clinical (histocompatibility and immunogenetics) of Living With Diabetes, Hypoglycemia, Managing Your Hypertension, Carotid Artery Disease, and High Cholesterol. Explained that if he had any questions concerning medications or nursing care, I would be available to answer any questions.  Dorthula Nettles, RN3, BSN, CBIS, [place], [facility], Inpatient Rehabilitation Office [phone] Cell [phone]

## 2021-09-03 NOTE — Progress Notes (Signed)
Inpatient Rehabilitation  Patient information reviewed and entered into eRehab system by Reo Portela M. Givanni Staron, M.A., CCC/SLP, PPS Coordinator.  Information including medical coding, functional ability and quality indicators will be reviewed and updated through discharge.    

## 2021-09-03 NOTE — Progress Notes (Signed)
ANTICOAGULATION CONSULT NOTE - follow up  Pharmacy Consult for Coumadin and lovenox dosing Indication: VTE prophylaxis s/p BKA in a patient with a mechanical aortic valve replacement (12/05/2019) requiring anticoagulation  Allergies  Allergen Reactions   Pollen Extract Other (See Comments)    Runny nose, watery eyes, sneezing    Patient Measurements: Height: 5' 10" (177.8 cm) IBW/kg (Calculated) : 73   Vital Signs: Temp: 98.2 F (36.8 C) (12/08 0540) Temp Source: Oral (12/08 0540) BP: 109/77 (12/08 0540)  Labs: Recent Labs    08/31/21 1256 09/01/21 0155 09/02/21 0509 09/02/21 0950 09/03/21 0459 09/03/21 0808  HGB  --   --   --  11.4* 10.8*  --   HCT  --   --   --  36.2* 33.3*  --   PLT  --   --   --  407* 348  --   LABPROT  --  17.4* 18.8*  --   --  19.3*  INR  --  1.4* 1.6*  --   --  1.6*  HEPRLOWMOCWT 0.89  --   --   --   --   --   CREATININE  --   --  0.65  --  0.60*  --    09/02/21:  Hgb 11.4,  HCT 36.2 , PLTC 407k  Estimated Creatinine Clearance: 186 mL/min (A) (by C-G formula based on SCr of 0.6 mg/dL (L)).   Medical History: Past Medical History:  Diagnosis Date   Anxiety    Aortic stenosis    Cellulitis and abscess of lower extremity 06/11/2019   Cellulitis of fourth toe of left foot    Cholelithiasis    Coronary artery disease    Nonobstructive CAD (40-50% LAD) 08/2019   Depression    Depression    Phreesia 09/27/2020   Depression    Phreesia 11/01/2020   Diabetes mellitus without complication ([place])    Phreesia 09/27/2020   Elevated troponin level not due myocardial infarction 11/11/2019   Essential hypertension    Gangrene of toe of left foot ([place]) 07/06/2019   Heart murmur    Phreesia 09/27/2020   Hyperlipidemia    Phreesia 09/27/2020   Hypertension    Phreesia 09/27/2020   Mixed hyperlipidemia    Morbid obesity ([place])    S/P aortic valve replacement with mechanical valve 12/05/2019   25 mm Carbomedics top hat bileaflet mechanical valve  via partial upper hemi-sternotomy   Severe aortic stenosis 09/24/2019   Type 2 diabetes mellitus (HCC)     Medications:  Medications Prior to Admission  Medication Sig Dispense Refill Last Dose   atorvastatin (LIPITOR) 80 MG tablet Take 1 tablet (80 mg total) by mouth daily. 90 tablet 3    ceFAZolin (ANCEF) IVPB Inject 2 g into the vein every 8 (eight) hours for 22 days. Indication:  MSSA + GBS bacteremia First Dose: Yes Last Day of Therapy:  09/24/21 Labs - Once weekly:  CBC/D and BMP, Labs - Every other week:  ESR and CRP Method of administration: IV Push Method of administration may be changed at the discretion of home infusion pharmacist based upon assessment of the patient and/or caregiver's ability to self-administer the medication ordered. 22 Units 0    enoxaparin (LOVENOX) 150 MG/ML injection Inject 1 mL (150 mg total) into the skin every 12 (twelve) hours. 0 mL     insulin glargine (LANTUS SOLOSTAR) 100 UNIT/ML Solostar Pen Inject 30 Units into the skin at bedtime. 30 mL 3  lisinopril (ZESTRIL) 10 MG tablet Take 1 tablet (10 mg total) by mouth daily.      metoprolol tartrate (LOPRESSOR) 25 MG tablet Take 1 tablet by mouth twice daily 180 tablet 0    oxyCODONE 10 MG TABS Take 1-1.5 tablets (10-15 mg total) by mouth every 4 (four) hours as needed for severe pain (pain score 7-10). 30 tablet 0    pantoprazole (PROTONIX) 40 MG tablet Take 1 tablet (40 mg total) by mouth daily.      sertraline (ZOLOFT) 50 MG tablet Take 1 tablet by mouth once daily 90 tablet 1    sitaGLIPtin-metformin (JANUMET) 50-500 MG tablet Take 1 tablet by mouth 2 (two) times daily with a meal. 180 tablet 3    warfarin (COUMADIN) 2.5 MG tablet Take 5 tablets (12.5 mg total) by mouth one time only at 4 PM.       Assessment: Patient with a history of aortic valve replacement on December 05, 2019 (Carbomedics Top Hat mechanical in position 1).    PTA Coumadin dose 54m daily (last taken 11/29 PTA) and INR was  therapeutic on admit. Coumadin held on admission for surgery.   S/p  left BKA 12/2.  Coumadin resumed 08/29/21.   Need for continued anticoagulation s/p BKA with limited mobility d/t recent surgery and a subtherapeutic INR (1.4 on 08/30/21)  requiring overlapping anticoagulation until INR is therapeutic (2.0 - 3.0) given his co-morbidities.  Thus started Lovenox bridge 12/4 until INR > or equal to 2.0. on coumadin.    Lovenox dose is 150 mg sq Q12 hour.  On 08/31/21: anti-Xa level = 0.89 units/ml 4hr post lovenox dose.  Level is therapeutic (goal 0.6-1 units/ml).  SCr 0.65, stable.  CBC stable: Hgb 11.4,  HCT 36.2 , PLTC 407k.   09/02/21, INR increased from 1.4 to 1.6 after  128m 106m44m61md 15mg33mumadin doses over past 4 days,  after coumadin held x3 days 11/30-12/2/22.  His PTA dose has been maintained on coumadin 10 mg daily for at least 6 months per review of his outpatient anti-coag visit notes in EPIC.   CBC stable : Hgb 10.8  HCT 33.3 , PLTC 348,000 No bleeding reported.    Goal of Therapy:  INR 2-3 Anti-Xa level 0.6-1 units/ml 4hrs after LMWH dose given Monitor platelets by anticoagulation protocol: Yes   Plan:  Coumadin 14 mg po x 1 today Continue Lovenox 150 mg sq q12h until INR is 2.0 or higher (mechanical at the aortic position requires a lower INR goal vs mechanical at the mitral position unless there is a history of VTE in current goal of therapy INR window).  Monitor INR daily CBC every 72 hours, next due 09/05/21   Casey Maxfield BS, PharmD, BCPS Clinical Pharmacist 09/03/2021,9:35 AM Please check AMION for all [facility] numbers After 10:00 PM, call Main [phone]

## 2021-09-03 NOTE — Progress Notes (Signed)
Occupational Therapy Session Note  Patient Details  Name: Adam Long MRN: [id number] Date of Birth: 13-Aug-1976  Today's Date: 09/04/2021 OT Individual Time: 0900-1014 OT Individual Time Calculation (min): 74 min   Short Term Goals: Week 1:  OT Short Term Goal 1 (Week 1): STGs = LTGs  Skilled Therapeutic Interventions/Progress Updates:    Pt greeted in bed, requesting to start session by using the restroom. Setup for supine<sit with HOB raised, using the bedrail. Pt asked for "a minute" EOB to collect himself and then completed ambulatory transfer to the toilet using RW with CGA and vcs. Able to lower clothing in standing with Min balance assist alone. Education provided on using lateral lean technique to complete perihygiene post BM void however pt unable to adequately reach due to body habitus. Worked on standing endurance and standing balance while pt stood with RW so OT could assist with hygiene and elevating underwear + pants. Tried to position the w/c in bathroom for stand pivot but due to w/c width and position of his bed near the doorway, unable to fit w/c inside of bathroom. Pt able hop out of the bathroom and side-hop towards his w/c using the RW with vcs. He then engaged in UB bathing with setup and shaving tasks with the same assistance. Overhead shirt donned with setup as well. OT provided him with a LH mirror to inspect incision. Discussed importance of daily limb inspection during self care routine. Education completed on figure 8 wrapping technique above knee joint for limb shaping in prep for prosthetic. Also discussed exercises that he can do in sidelying in bed to promote hip and knee extension. He has already joined an online amputee support group to address psychosocial health and healing. Pt remained sitting up at close of session, all needs within reach.    Therapy Documentation Precautions:  Precautions Precautions: Fall, Other (comment) Required Braces or Orthoses:  Other Brace Other Brace: limb protector Restrictions Weight Bearing Restrictions: Yes LLE Weight Bearing: Non weight bearing Other Position/Activity Restrictions: L limb protector  Pain: in residual limb, per pt premedicated Pain Assessment Pain Score: 0-No pain ADL: ADL Eating: Independent Grooming: Independent Upper Body Bathing: Setup Lower Body Bathing: Minimal assistance Where Assessed-Lower Body Bathing: Edge of bed Upper Body Dressing: Independent Lower Body Dressing: Contact guard Where Assessed-Lower Body Dressing: Edge of bed Toileting: Minimal assistance Toilet Transfer: Minimal assistance  Therapy/Group: Individual Therapy  Vivan Vanderveer A Zabrina Brotherton 09/04/2021, 12:38 PM

## 2021-09-03 NOTE — Progress Notes (Signed)
Orthopedic Tech Progress Note Patient Details:  Adam Long 12/10/1975 [id number]  Patient ID: Adam Long, male   DOB: [birth year], 45 y.o.   MRN: [id number] Placed order with [facility].  Darleen Crocker 09/03/2021, 6:36 PM

## 2021-09-03 NOTE — Progress Notes (Signed)
Physical Therapy Session Note  Patient Details  Name: Adam Long MRN: [id number] Date of Birth: [birth year]  Today's Date: 09/03/2021 PT Individual Time: 3846-6599 PT Individual Time Calculation (min): 28 min   Short Term Goals: Week 1:  PT Short Term Goal 1 (Week 1): =LTGs d/t ELOS   Skilled Therapeutic Interventions/Progress Updates:    Pt received sitting in WC and agreeable to PT. Pt instructed in seated BLE therex.   HS curls x 12 , LAQ 2 x 12 , hip abduction x 12 , hip flexion x 12, ankle PF x 20. Ankle DF x 15. Hip extension from flexed position x 12. Performed with Level 4 tband BLE hip and RLE knee and ankle; LLE performed with AAROM. Cues for full ROM and decreased speed of eccentric movement.   Pt left sitting in recliner with call bell in reach and all needs ment.     Therapy Documentation Precautions:  Precautions Precautions: Fall, Other (comment) Required Braces or Orthoses: Other Brace Other Brace: limb protector Restrictions Weight Bearing Restrictions: Yes LLE Weight Bearing: Non weight bearing Other Position/Activity Restrictions: L limb protector  Vital Signs: Therapy Vitals Temp: 98.4 F (36.9 C) Temp Source: Oral Pulse Rate: 89 Resp: 18 BP: 129/69 Patient Position (if appropriate): Sitting Oxygen Therapy SpO2: 100 % O2 Device: Room Air Pain: Pain Assessment Pain Scale: 0-10 Pain Score: 0-No pain    Therapy/Group: Individual Therapy  Golden Pop 09/03/2021, 5:01 PM

## 2021-09-03 NOTE — Progress Notes (Signed)
Inpatient Rehabilitation Center Individual Statement of Services  Patient Name:  Adam Long  Date:  09/03/2021  Welcome to the Inpatient Rehabilitation Center.  Our goal is to provide you with an individualized program based on your diagnosis and situation, designed to meet your specific needs.  With this comprehensive rehabilitation program, you will be expected to participate in at least 3 hours of rehabilitation therapies Monday-Friday, with modified therapy programming on the weekends.  Your rehabilitation program will include the following services:  Physical Therapy (PT), Occupational Therapy (OT), 24 hour per day rehabilitation nursing, Therapeutic Recreaction (TR), Neuropsychology, Care Coordinator, Rehabilitation Medicine, Nutrition Services, and Pharmacy Services  Weekly team conferences will be held on Tuesday to discuss your progress.  Your Inpatient Rehabilitation Care Coordinator will talk with you frequently to get your input and to update you on team discussions.  Team conferences with you and your family in attendance may also be held.  Expected length of stay: 7-9 days  Overall anticipated outcome: independent with assistive device  Depending on your progress and recovery, your program may change. Your Inpatient Rehabilitation Care Coordinator will coordinate services and will keep you informed of any changes. Your Inpatient Rehabilitation Care Coordinator's name and contact numbers are listed  below.  The following services may also be recommended but are not provided by the Inpatient Rehabilitation Center:  Driving Evaluations Home Health Rehabiltiation Services Outpatient Rehabilitation Services Vocational Rehabilitation   Arrangements will be made to provide these services after discharge if needed.  Arrangements include referral to agencies that provide these services.  Your insurance has been verified to be:  [employer] Your primary doctor is:  Bjorn Pippin  Pertinent information will be shared with your doctor and your insurance company.  Inpatient Rehabilitation Care Coordinator:  Dossie Der, [place] [phone] or (Luna [place]  Information discussed with and copy given to patient by: Lucy Chris, 09/03/2021, 10:08 AM

## 2021-09-03 NOTE — Progress Notes (Addendum)
Inpatient Rehabilitation Care Coordinator Assessment and Plan Patient Details  Name: Adam Long MRN: [id number] Date of Birth: [birth year]  Today's Date: 09/03/2021  Hospital Problems: Principal Problem:   Below-knee amputation of left lower extremity [facility])  Past Medical History:  Past Medical History:  Diagnosis Date   Anxiety    Aortic stenosis    Cellulitis and abscess of lower extremity 06/11/2019   Cellulitis of fourth toe of left foot    Cholelithiasis    Coronary artery disease    Nonobstructive CAD (40-50% LAD) 08/2019   Depression    Depression    Phreesia 09/27/2020   Depression    Phreesia 11/01/2020   Diabetes mellitus without complication (HCC)    Phreesia 09/27/2020   Elevated troponin level not due myocardial infarction 11/11/2019   Essential hypertension    Gangrene of toe of left foot (HCC) 07/06/2019   Heart murmur    Phreesia 09/27/2020   Hyperlipidemia    Phreesia 09/27/2020   Hypertension    Phreesia 09/27/2020   Mixed hyperlipidemia    Morbid obesity (HCC)    S/P aortic valve replacement with mechanical valve 12/05/2019   25 mm Carbomedics top hat bileaflet mechanical valve via partial upper hemi-sternotomy   Severe aortic stenosis 09/24/2019   Type 2 diabetes mellitus (HCC)    Past Surgical History:  Past Surgical History:  Procedure Laterality Date   ABDOMINAL AORTOGRAM W/LOWER EXTREMITY N/A 07/06/2019   Procedure: ABDOMINAL AORTOGRAM W/LOWER EXTREMITY;  Surgeon: Sherren Kerns, MD;  [facility];  Service: Cardiovascular;  Laterality: N/A;  Bilateral   AMPUTATION Left 07/09/2019   Procedure: LEFT FOURTH and Fifth TOE AMPUTATION.;  Surgeon: Larina Earthly, MD;  Location: [facility] OR;  Service: Vascular;  Laterality: Left;   AMPUTATION Left 03/11/2021   Procedure: LEFT FOOT 5TH  AND 4TH RAY AMPUTATION;  Surgeon: Nadara Mustard, MD;  Location: [facility] OR;  Service: Orthopedics;  Laterality: Left;   AMPUTATION Left 08/28/2021    Procedure: AMPUTATION BELOW KNEE;  Surgeon: Nadara Mustard, MD;  Location: [facility] OR;  Service: Orthopedics;  Laterality: Left;   AORTIC VALVE REPLACEMENT N/A 12/05/2019   Procedure: PARTIAL STERNOTOMY FOR AORTIC VALVE REPLACEMENT (AVR), USING CARBOMEDICS SUPRA-ANNULAR TOP HAT ;  Surgeon: Purcell Nails, MD;  Location: [facility] OR;  Service: Open Heart Surgery;  Laterality: N/A;  No neck lines on left   CARDIAC VALVE REPLACEMENT N/A    Phreesia 09/27/2020   IR RADIOLOGY PERIPHERAL GUIDED IV START  10/05/2019   IR US GUIDE VASC ACCESS RIGHT  10/05/2019   MULTIPLE EXTRACTIONS WITH ALVEOLOPLASTY N/A 10/26/2019   Procedure: EXTRACTION OF TOOTH #'S 3, 5-11,19-28,  AND 32 WITH ALVEOLOPLASTY;  Surgeon: Charlynne Pander, DDS;  Location: [facility] OR;  Service: Oral Surgery;  Laterality: N/A;   RIGHT HEART CATH AND CORONARY ANGIOGRAPHY N/A 09/24/2019   Procedure: RIGHT HEART CATH AND CORONARY ANGIOGRAPHY;  Surgeon: Tonny Bollman, MD;  Location: [facility] INVASIVE CV LAB;  Service: Cardiovascular;  Laterality: N/A;   TEE WITHOUT CARDIOVERSION N/A 12/05/2019   Procedure: TRANSESOPHAGEAL ECHOCARDIOGRAM (TEE);  Surgeon: Purcell Nails, MD;  Location: [facility] OR;  Service: Open Heart Surgery;  Laterality: N/A;   TEE WITHOUT CARDIOVERSION N/A 09/01/2021   Procedure: TRANSESOPHAGEAL ECHOCARDIOGRAM (TEE);  Surgeon: Little Ishikawa, MD;  Location: [facility] ENDOSCOPY;  Service: Cardiovascular;  Laterality: N/A;   Social History:  reports that he has never smoked. He quit smokeless tobacco use about 23 months ago.  His smokeless tobacco  use included chew. He reports that he does not currently use alcohol. He reports that he does not use drugs.  Family / Support Systems Marital Status: Single Patient Roles: Other (Comment) (Uncle and Aunt along with cousin) Other Supports: Earley Brooke [phone]  Crystal-cousin [phone] Anticipated Caregiver: Self and Aunt and uncle Ability/Limitations of Caregiver: pt will not have 24/7 care at  [facility] Caregiver Availability: Intermittent Family Dynamics: Close knit with realatives and cousin-hopes to be able to be mod/i since they can not assist him. 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 His home is not wheelchair accessible and he will need to use a rolling walker. He knows he will need to change his ways and habits to be better with his health issues. Recent Psychosocial Issues: other health issues Psychiatric History: History of anxiety takes medications for this and finds them helpful. May benefit from seeing neuro-psych while here Substance Abuse History: No issues  Patient / Family Perceptions, Expectations & Goals Pt/Family understanding of illness & functional limitations: Pt is able to explain his amputation and need for IV antibiotics. He is aware he will go home with them since will go until 12/29. He  talks with the MD daily and feels his questions and concerns are being addressed. Premorbid pt/family roles/activities: Neohew, employee, cousin, friend, etc Anticipated changes in roles/activities/participation: resume Pt/family expectations/goals: Pt states: " I want to be able to take care of myself I always have and need too."  [employer]: None Premorbid Home Care/DME Agencies: Other (Comment) (has cane) Transportation available at discharge: Pt drove but will need family to drive now until gets prothesis Is the patient able to respond to transportation needs?: Yes In the past 12 months, has lack of transportation kept you from medical appointments or from getting medications?: No In the past 12 months, has lack of transportation kept you from meetings, work, or from getting things needed for daily living?: No Resource referrals recommended: Neuropsychology  Discharge Planning Living Arrangements: Other relatives Support Systems: Other relatives, Friends/neighbors Type of Residence: Private residence Insurance Resources: Media planner (specify) ([employer]) Financial Resources: Employment, Garment/textile technologist Screen Referred: Yes Living Expenses: Lives with family Money Management: Patient Does the patient have any problems obtaining your medications?: No Home Management: Pt and uncle Patient/Family Preliminary Plans: Return home with uncle who is in [place] good health and can be there. He needs to be mod/i level and a wheelchair will not fit in his home, so using a rolling walker. Will await evaluations and work  on discharge needs. Care Coordinator Barriers to Discharge: Inaccessible home environment, Lack of/limited family support, Weight, IV antibiotics, Insurance for SNF coverage Care Coordinator Anticipated Follow Up Needs: HH/OP  Clinical Impression Pleasant gentleman who is willing to make some changes in his health to be healthier. He  certainly does not want to lose his other leg and knows he needs to protect it. Will work on IV antibiotics and await team's evaluations. Needs to be mod/I before going home, he does not have a 24/7 caregiver  Lucy Chris 09/03/2021, 10:05 AM

## 2021-09-03 NOTE — Progress Notes (Signed)
PROGRESS NOTE   Subjective/Complaints:  Pt reports pain is tolerable- 5-6/10- this AM LBM yesterday  Per WOC note, changed to wicking fabric- silver antimicrobial fabric by WOC for MASD in groin.   ROS:  Pt denies SOB, abd pain, CP, N/V/C/D, and vision changes   Objective:   ECHO TEE  Result Date: 09/01/2021    TRANSESOPHOGEAL ECHO REPORT   Patient Name:   Adam Long Date of Exam: 09/01/2021 Medical Rec #:  [id number]       Height:       70.0 in Accession #:    [id number]      Weight:       380.1 lb Date of Birth:  [birth year]       BSA:          2.742 m Patient Age:    45 years        BP:           106/58 mmHg Patient Gender: M               HR:           101 bpm. Exam Location:  Inpatient Procedure: 3D Echo, Cardiac Doppler and Color Doppler Indications:     Bacteremia. I35.8 Other nonrheumatic aortic valve disorders  History:         Patient has prior history of Echocardiogram examinations, most                  recent 08/29/2021.  Sonographer:     Margreta Journey RDCS Referring Phys:  [id number] Manson Passey Diagnosing Phys: Epifanio Lesches MD PROCEDURE: After discussion of the risks and benefits of a TEE, an informed consent was obtained from the patient. The transesophogeal probe was passed without difficulty through the esophogus of the patient. Imaged were obtained with the patient in a left lateral decubitus position. Sedation performed by different physician. The patient was monitored while under deep sedation. Anesthestetic sedation was provided intravenously by Anesthesiology: 971mg  of Propofol, 100mg  of Lidocaine. Image quality was  adequate. The patient developed no complications during the procedure. IMPRESSIONS  1. Left ventricular ejection fraction, by estimation, is 60 to 65%. The left ventricle has normal function. The left ventricle has no regional wall motion abnormalities.  2. Right ventricular systolic  function is normal. The right ventricular size is mildly enlarged.  3. No left atrial/left atrial appendage thrombus was detected.  4. The mitral valve is normal in structure. Trivial mitral valve regurgitation.  5. There is a 25 mm bileaflet valve present in the aortic position     Aortic valve regurgitation is not visualized. Echo findings are consistent with normal structure and function of the aortic valve prosthesis. Aortic valve mean gradient measures 13.0 mmHg. Conclusion(s)/Recommendation(s): No vegetation seen. Pulmonic valve poorly visualized due to shadowing from mechanical aortic valve. No clear vegetation on mechanical aortic valve. Would monitor serial echocardiogram while treating bacteremia if clinical  suspicion for endocarditis. FINDINGS  Left Ventricle: Left ventricular ejection fraction, by estimation, is 60 to 65%. The left ventricle has normal function. The left ventricle has no regional wall motion abnormalities. The left ventricular internal cavity size was normal  in size. Right Ventricle: The right ventricular size is mildly enlarged. Right vetricular wall thickness was not well visualized. Right ventricular systolic function is normal. Left Atrium: Left atrial size was normal in size. No left atrial/left atrial appendage thrombus was detected. Right Atrium: Right atrial size was normal in size. Pericardium: There is no evidence of pericardial effusion. Mitral Valve: The mitral valve is normal in structure. Trivial mitral valve regurgitation. Tricuspid Valve: The tricuspid valve is normal in structure. Tricuspid valve regurgitation is trivial. Aortic Valve: The aortic valve has been repaired/replaced. Aortic valve regurgitation is not visualized. Aortic valve mean gradient measures 13.0 mmHg. There is a 25 mm bileaflet valve present in the aortic position. Echo findings are consistent with normal structure and function of the aortic valve prosthesis. Pulmonic Valve: The pulmonic valve was  not well visualized. Pulmonic valve regurgitation is trivial. Aorta: The aortic root and ascending aorta are structurally normal, with no evidence of dilitation. IAS/Shunts: No atrial level shunt detected by color flow Doppler.  AORTIC VALVE AV Mean Grad: 13.0 mmHg Epifanio Lesches MD Electronically signed by Epifanio Lesches MD Signature Date/Time: 09/01/2021/1:19:23 PM    Final    Recent Labs    09/02/21 0950 09/03/21 0459  WBC 14.3* 10.7*  HGB 11.4* 10.8*  HCT 36.2* 33.3*  PLT 407* 348   Recent Labs    09/02/21 0509 09/03/21 0459  NA  --  128*  K  --  4.1  CL  --  98  CO2  --  25  GLUCOSE  --  147*  BUN  --  15  CREATININE 0.65 0.60*  CALCIUM  --  8.5*    Intake/Output Summary (Last 24 hours) at 09/03/2021 1007 Last data filed at 09/03/2021 0700 Gross per 24 hour  Intake 540 ml  Output 1955 ml  Net -1415 ml        Physical Exam: Vital Signs Blood pressure 109/77, pulse 83, temperature 98.2 F (36.8 C), temperature source Oral, resp. rate 18, height 5\' 10"  (1.778 m), SpO2 99 %.   General: awake, alert, appropriate, BMI 54; sitting up slightly in bed; NAD HENT: conjugate gaze; oropharynx moist CV: regular rate; no JVD Pulmonary: CTA B/L; no W/R/R- good air movement GI: soft, NT, ND, (+)BS; protuberant; hypoactive Psychiatric: appropriate- not real interactive Neurological: alert Skin: L BKA- dressed C/D/I- with shrinker in place  Assessment/Plan: 1. Functional deficits which require 3+ hours per day of interdisciplinary therapy in a comprehensive inpatient rehab setting. Physiatrist is providing close team supervision and 24 hour management of active medical problems listed below. Physiatrist and rehab team continue to assess barriers to discharge/monitor patient progress toward functional and medical goals  Care Tool:  Bathing              Bathing assist       Upper Body Dressing/Undressing Upper body dressing        Upper body assist       Lower Body Dressing/Undressing Lower body dressing      What is the patient wearing?: Underwear/pull up     Lower body assist       Toileting Toileting    Toileting assist Assist for toileting: Independent with assistive device Assistive Device Comment: Rolling walker   Transfers Chair/bed transfer  Transfers assist     Chair/bed transfer assist level: Independent with assistive device Chair/bed transfer assistive device:   Ambulation assist  Walk 10 feet activity   Assist           Walk 50 feet activity   Assist           Walk 150 feet activity   Assist           Walk 10 feet on uneven surface  activity   Assist           Wheelchair     Assist               Wheelchair 50 feet with 2 turns activity    Assist            Wheelchair 150 feet activity     Assist          Blood pressure 109/77, pulse 83, temperature 98.2 F (36.8 C), temperature source Oral, resp. rate 18, height  (1.778 m), SpO2 99 %.  Medical Problem List and Plan: 1. Functional deficits secondary to L BKA             -patient may shower but incision must be covered             -ELOS/Goals: 10-14 days S -first day of evaluations- con't CIR_ PT and OT 2.  Mechanical Aortic valve/Antithrombotics: -DVT/anticoagulation:  Pharmaceutical: Coumadin with lovenox bridge till INR>2.5 12/8- INR 1.6 today- will con't to monitor- per pharmacy             -antiplatelet therapy: N/A 3. Residual limb pain: continue Oxycodone prn.  4. Mood: LCSW to follow for evaluation and support.              -antipsychotic agents: N/A 5. Neuropsych: This patient is capable of making decisions on his own behalf. 6. Skin/Wound Care: Routine pressure relief measures.              --wound VAC come off POD# 7 on 12/09  12/8- MASD in groin- WOC started silver antimicrobial wicking fabric 7.  Fluids/Electrolytes/Nutrition: Monitor I/O. Check CMET in am.  8. Sepsis secondary to MSSA bacteremia: continue IV cefazolin with end date 09/24/21.             --weekly labs 9. T2DM: Uncontrolled with A1C-7.8. Monitor BS ac/hs and use SSI for elevated BS             --start metformin  daily             --continue insulin glargine 15 units (was on 30 units PTA) with SSI for elevated BS.    12/8- CBGs 122-154- will con't regimen- is adequate control.  10. HTN: Monitor BP TID--has been running low             --Lisinopril 10 mg with metoprolol bid. Monitor for orthostatic changes.   12/8- BP a little soft- no hypotensive Sx's- con't regimen 11. Super morbid obesity: BMI 54.5. will consult dietician to educate on diet --Educate on weight loss and dietary changes to help promote overall health and mobility.  12. Hyponatremia: Recheck lytes in am. 12/8- Na 128- will recheck in AM- if still <130, will put on fluid restriction  13. Malnutrition: Albumin has dropped from 3.2-->2.7 today.             --add protein supplement TID. 14. Leucocytosis:Has been fluctuating 15.3-->12.3-->14.3  12/8- WBC down to 10.7- so doing much better- con't to monitor --Monitor for fevers and trends.     LOS: 1 days A FACE TO FACE EVALUATION WAS PERFORMED  Mercia Dowe  Aloni Chuang 09/03/2021, 10:07 AM

## 2021-09-03 NOTE — Evaluation (Signed)
Physical Therapy Assessment and Plan  Patient Details  Name: Adam Long MRN: [id number] Date of Birth: November 11, 1975  PT Diagnosis: Abnormality of gait, Difficulty walking, Edema, Muscle weakness, and Pain in residual limb Rehab Potential: Excellent ELOS: 7-9   Today's Date: 09/03/2021 PT Individual Time: 1030-1130, 1412-1510 PT Individual Time Calculation (min): 60 min, 55 min   Hospital Problem: Principal Problem:   Below-knee amputation of left lower extremity ([place])   Past Medical History:  Past Medical History:  Diagnosis Date   Anxiety    Aortic stenosis    Cellulitis and abscess of lower extremity 06/11/2019   Cellulitis of fourth toe of left foot    Cholelithiasis    Coronary artery disease    Nonobstructive CAD (40-50% LAD) 08/2019   Depression    Depression    Phreesia 09/27/2020   Depression    Phreesia 11/01/2020   Diabetes mellitus without complication ([place])    Phreesia 09/27/2020   Elevated troponin level not due myocardial infarction 11/11/2019   Essential hypertension    Gangrene of toe of left foot ([place]) 07/06/2019   Heart murmur    Phreesia 09/27/2020   Hyperlipidemia    Phreesia 09/27/2020   Hypertension    Phreesia 09/27/2020   Mixed hyperlipidemia    Morbid obesity (HCC)    S/P aortic valve replacement with mechanical valve 12/05/2019   25 mm Carbomedics top hat bileaflet mechanical valve via partial upper hemi-sternotomy   Severe aortic stenosis 09/24/2019   Type 2 diabetes mellitus ([place])    Past Surgical History:  Past Surgical History:  Procedure Laterality Date   ABDOMINAL AORTOGRAM W/LOWER EXTREMITY N/A 07/06/2019   Procedure: ABDOMINAL AORTOGRAM W/LOWER EXTREMITY;  Surgeon: Elam Dutch, MD;  Location: [facility];  Service: Cardiovascular;  Laterality: N/A;  Bilateral   AMPUTATION Left 07/09/2019   Procedure: LEFT FOURTH and Fifth TOE AMPUTATION.;  Surgeon: Rosetta Posner, MD;  Location: [facility];  Service: Vascular;   Laterality: Left;   AMPUTATION Left 03/11/2021   Procedure: LEFT FOOT 5TH  AND 4TH RAY AMPUTATION;  Surgeon: Newt Minion, MD;  Location: [facility];  Service: Orthopedics;  Laterality: Left;   AMPUTATION Left 08/28/2021   Procedure: AMPUTATION BELOW KNEE;  Surgeon: Newt Minion, MD;  Location: [facility];  Service: Orthopedics;  Laterality: Left;   AORTIC VALVE REPLACEMENT N/A 12/05/2019   Procedure: PARTIAL STERNOTOMY FOR AORTIC VALVE REPLACEMENT (AVR), USING CARBOMEDICS SUPRA-ANNULAR TOP HAT 25MM;  Surgeon: Rexene Alberts, MD;  Location: [facility];  Service: Open Heart Surgery;  Laterality: N/A;  No neck lines on left   CARDIAC VALVE REPLACEMENT N/A    Phreesia 09/27/2020   IR RADIOLOGY PERIPHERAL GUIDED IV START  10/05/2019   IR US GUIDE VASC ACCESS RIGHT  10/05/2019   MULTIPLE EXTRACTIONS WITH ALVEOLOPLASTY N/A 10/26/2019   Procedure: EXTRACTION OF TOOTH #'S 3, 5-11,19-28,  AND 32 WITH ALVEOLOPLASTY;  Surgeon: Lenn Cal, DDS;  Location: [facility];  Service: Oral Surgery;  Laterality: N/A;   RIGHT HEART CATH AND CORONARY ANGIOGRAPHY N/A 09/24/2019   Procedure: RIGHT HEART CATH AND CORONARY ANGIOGRAPHY;  Surgeon: Sherren Mocha, MD;  Location: [facility];  Service: Cardiovascular;  Laterality: N/A;   TEE WITHOUT CARDIOVERSION N/A 12/05/2019   Procedure: TRANSESOPHAGEAL ECHOCARDIOGRAM (TEE);  Surgeon: Rexene Alberts, MD;  Location: [facility];  Service: Open Heart Surgery;  Laterality: N/A;   TEE WITHOUT CARDIOVERSION N/A 09/01/2021   Procedure: TRANSESOPHAGEAL ECHOCARDIOGRAM (TEE);  Surgeon: Gardiner Rhyme,  Doreatha Martin, MD;  Location: [facility];  Service: Cardiovascular;  Laterality: N/A;    Assessment & Plan Clinical Impression: [name] is a 45 year old male with history of T2DM, HTN, mechanical AVR/non-obst CAD-on coumadin, diabetic foot ulcer with gangrenous changes, subacute osteomyelitis s/p ray amputation 06/22 who was admitted on 08/27/21 with one week history of increase in edema  and erythema, progressive drainage and some chills with nausea. He was transitioned to IV heparin in anticipation of surgery as foot not felt to be viable and he was started on broad spectrum antibiotics. He underwent BKA by Dr. Sharol Given on 12/02. Blood cultures done showed MSSA bacteremia and TEE done which was negative for endocarditis. Dr. Juleen China recommends felt that bacteremia secondary to foot infection and antibiotics narrowed to cefazolin with end date of 09/24/21 to complete 4 weeks antibiotic regimen. Therapy has been ongoing and patient has had decline in ability to complete ADLs and decline in mobility with fatigue. CIR recommended due to functional decline..  Patient transferred to CIR on 09/02/2021 .    Patient currently requires min with mobility secondary to muscle weakness and decreased standing balance, decreased balance strategies, and difficulty maintaining precautions.  Prior to hospitalization, patient was independent  with mobility and lived with Family in a House home.  Home access is 3Stairs to enter.  Patient will benefit from skilled PT intervention to maximize safe functional mobility, minimize fall risk, and decrease caregiver burden for planned discharge home with intermittent assist.  Anticipate patient will benefit from follow up [facility] at discharge.  PT - End of Session Activity Tolerance: Tolerates 30+ min activity with multiple rests Endurance Deficit: Yes Endurance Deficit Description: Pt fatigues quickly PT Assessment Rehab Potential (ACUTE/IP ONLY): Excellent PT Barriers to Discharge: [place] home environment;Home environment access/layout;Decreased caregiver support;IV antibiotics;Weight;Weight bearing restrictions PT Patient demonstrates impairments in the following area(s): Balance;Safety;Edema;Endurance;Skin Integrity;Motor;Pain PT Transfers Functional Problem(s): Bed Mobility;Bed to Chair;Car PT Locomotion Functional Problem(s): Ambulation;Wheelchair  Mobility;Stairs PT Plan PT Intensity: Minimum of 1-2 x/day ,45 to 90 minutes PT Frequency: 5 out of 7 days PT Duration Estimated Length of Stay: 7-9 PT Treatment/Interventions: Ambulation/gait training;Discharge planning;DME/adaptive equipment instruction;Functional mobility training;Pain management;Psychosocial support;Therapeutic Activities;UE/LE Strength taining/ROM;Visual/perceptual remediation/compensation;Balance/vestibular training;Community reintegration;Disease management/prevention;Functional electrical stimulation;Neuromuscular re-education;Patient/family education;Skin care/wound management;Stair training;Therapeutic Exercise;UE/LE Coordination activities;Wheelchair propulsion/positioning PT Transfers Anticipated Outcome(s): supervision PT Locomotion Anticipated Outcome(s): supervision short distance gait, mod I w/c PT Recommendation Recommendations for Other Services: Neuropsych consult;Therapeutic Recreation consult Therapeutic Recreation Interventions: Stress management Follow Up Recommendations: Home health PT Patient destination: Home Equipment Recommended: Rolling walker with 5" wheels;Wheelchair (measurements);Wheelchair cushion (measurements) Equipment Details: Aundra Dubin and bari w/c, 22x18   Skilled Therapeutic Interventions/Progress Updates:  Session 1: Evaluation completed (see details above and below) with education on PT POC and goals and individual treatment initiated with focus on  retrieving appropriate equipment and initiating mobility. Strength and sensation testing and subjective exam performed in sitting as documented above and below. Pt then propelled w/c x 60 ft with BUE and supervision. Pt educated on w/c parts and functions. Pt performed hops in place for strength and to assess functional ability. Pt returned to room and performed Stand pivot transfer back to recliner with RW and CGA. Pt was left with all needs in reach and alarm active.   Session 2: Pt received  in recliner and agreeable to therapy.  Pt reports 6/10 pain in residual limb. Nsg administered pain medication during session. Therapist first re-dressed residual limb for improved fit in limb guard with minimal  success, PA made aware of poor fit and need for different size limb guard. Guard donned with tot A. Pt propelled w/c with BUE throughout session, up to 80 ft. Pt performed car transfer with CGA. Pt ambulated x 15 ft and x 20 ft with RW and CGA, reporting fatigue. Pt propelled chair up/down ramp and then back to room. Stand pivot transfer with RW to recliner. Pt was left with all needs in reach and alarm active.   PT Evaluation Precautions/Restrictions Precautions Precautions: Fall;Other (comment) Required Braces or Orthoses: Other Brace Other Brace: limb protector Restrictions Weight Bearing Restrictions: Yes LLE Weight Bearing: Non weight bearing Other Position/Activity Restrictions: L limb protector  Pain Interference Pain Interference Pain Effect on Sleep: 2. Occasionally Pain Interference with Therapy Activities: 1. Rarely or not at all Pain Interference with Day-to-Day Activities: 1. Rarely or not at all Home Living/Prior [place]: Other relatives Available Help at Discharge: Family;Neighbor;Available PRN/intermittently (uncle and nephew work, other family members available) Type of Home: House Home Access: Stairs to enter [employer] of Steps: 3 Entrance Stairs-Rails: Left Home Layout: Two level;Able to live on main level with bedroom/bathroom Alternate Level Stairs-Number of Steps: 10 to 15 Alternate Level Stairs-Rails: Right;Left;Can reach both  Lives With: Family Prior Function Level of Independence: Independent with gait;Independent with transfers;Independent with homemaking with ambulation  Able to Take Stairs?: Yes Driving: Yes Vocation: Full time employment Vocation Requirements: mostly desk work, light-mod lifting  at times Vision/Perception  Vision - History Ability to See in Adequate Light: 0 Adequate Perception Perception: Within Functional Limits Praxis Praxis: Intact  Cognition Overall Cognitive Status: Within Functional Limits for tasks assessed Arousal/Alertness: Awake/alert Orientation Level: Oriented X4 Year: 2022 Month: December Day of Week: Correct Memory: Appears intact Immediate Memory Recall: Sock;Blue;Bed Memory Recall Sock: Without Cue Memory Recall Blue: Without Cue Memory Recall Bed: Without Cue Awareness: Appears intact Problem Solving: Appears intact Safety/Judgment: Appears intact Sensation Sensation Light Touch: Appears Intact Hot/Cold: Appears Intact Proprioception: Appears Intact Stereognosis: Appears Intact Coordination Gross Motor Movements are Fluid and Coordinated: No Fine Motor Movements are Fluid and Coordinated: Yes Coordination and Movement Description: Generalized weakness and COM shift d/t recent amputation Motor  Motor Motor: Within Functional Limits Motor - Skilled Clinical Observations: generalized weakness post surgery   Trunk/Postural Assessment  Cervical Assessment Cervical Assessment: Within Functional Limits Thoracic Assessment Thoracic Assessment: Within Functional Limits Lumbar Assessment Lumbar Assessment: Within Functional Limits Postural Control Postural Control: Within Functional Limits  Balance Balance Balance Assessed: Yes Dynamic Sitting Balance Dynamic Sitting - Level of Assistance: 5: Stand by assistance Static Standing Balance Static Standing - Level of Assistance: 5: Stand by assistance Dynamic Standing Balance Dynamic Standing - Level of Assistance: 4: Min assist Extremity Assessment  RUE Assessment RUE Assessment: Within Functional Limits LUE Assessment LUE Assessment: Within Functional Limits RLE Assessment RLE Assessment: Within Functional Limits General Strength Comments: Grossly >4+/5 LLE Assessment LLE  Assessment: Exceptions to [facility] LLE Strength LLE Overall Strength Comments: limited MMT d/t resent amputation Left Hip Flexion: 4+/5 Left Knee Flexion: 3+/5 (no resistance d/t pain) Left Knee Extension: 3+/5 (no resistance d/t pain)  Care Tool Care Tool Bed Mobility Roll left and right activity   Roll left and right assist level: Independent    Sit to lying activity   Sit to lying assist level: Independent    Lying to sitting on side of bed activity   Lying to sitting on side of bed assist level: the ability to move from lying  on the back to sitting on the side of the bed with no back support.: Independent     Care Tool Transfers Sit to stand transfer   Sit to stand assist level: Contact Guard/Touching assist Sit to stand assistive device: Walker  Chair/bed transfer   Chair/bed transfer assist level: Minimal Assistance - Patient > 75%     Toilet transfer   Assist Level: Minimal Assistance - Patient > 75%    Geneticist, molecular transfer assist level: Contact Guard/Touching assist      Care Tool Locomotion Ambulation   Assist level: Contact Guard/Touching assist      Walk 10 feet activity   Assist level: Contact Guard/Touching assist     Walk 50 feet with 2 turns activity Walk 50 feet with 2 turns activity did not occur: Safety/medical concerns      Walk 150 feet activity Walk 150 feet activity did not occur: Safety/medical concerns      Walk 10 feet on uneven surfaces activity Walk 10 feet on uneven surfaces activity did not occur: Safety/medical concerns      Stairs Stair activity did not occur: Safety/medical concerns        Walk up/down 1 step activity Walk up/down 1 step or curb (drop down) activity did not occur: Safety/medical concerns      Walk up/down 4 steps activity Walk up/down 4 steps activity did not occur: Safety/medical concerns      Walk up/down 12 steps activity Walk up/down 12 steps activity did not occur: Safety/medical concerns      Pick  up small objects from floor Pick up small object from the floor (from standing position) activity did not occur: Safety/medical concerns      Wheelchair Is the patient using a wheelchair?: Yes Type of Wheelchair: Manual   Wheelchair assist level: Supervision/Verbal cueing Max wheelchair distance: 80 ft  Wheel 50 feet with 2 turns activity   Assist Level: Supervision/Verbal cueing  Wheel 150 feet activity   Assist Level: Minimal Assistance - Patient > 75%    Refer to Care Plan for [place] 1 PT Short Term Goal 1 (Week 1): =LTGs d/t ELOS  Recommendations for other services: Neuropsych and Therapeutic Recreation  Stress management  Skilled Therapeutic Intervention Mobility Transfers Transfers: Sit to Stand;Stand Pivot Transfers Sit to Stand: Contact Guard/Touching assist Stand Pivot Transfers: Contact Guard/Touching assist Transfer (Assistive device): Rolling walker Locomotion  Gait Ambulation: Yes Gait Assistance: Minimal Assistance - Patient > 75% Gait Distance (Feet): 20 Feet Assistive device: Rolling walker Gait Assistance Details: Verbal cues for sequencing;Verbal cues for precautions/safety;Verbal cues for safe use of DME/AE;Verbal cues for technique Gait Gait: Yes Gait Pattern: Impaired Gait Pattern:  (hop to pattern) Stairs / Additional Locomotion Stairs: No Wheelchair Mobility Wheelchair Mobility: Yes Wheelchair Assistance: Chartered loss adjuster: Both upper extremities Wheelchair Parts Management: Needs assistance Distance: 100 ft   Discharge Criteria: Patient will be discharged from PT if patient refuses treatment 3 consecutive times without medical reason, if treatment goals not met, if there is a change in medical status, if patient makes no progress towards goals or if patient is discharged from hospital.  The above assessment, treatment plan, treatment alternatives and goals were discussed and  mutually agreed upon: by patient  Mickel Fuchs 09/03/2021, 12:59 PM

## 2021-09-03 NOTE — Evaluation (Signed)
Occupational Therapy Assessment and Plan  Patient Details  Name: Adam Long MRN: [id number] Date of Birth: [birth year]  OT Diagnosis: acute pain and muscle weakness (generalized) Rehab Potential: Rehab Potential (ACUTE ONLY): Excellent ELOS: 7-9 days   Today's Date: 09/03/2021 OT Individual Time: 8891-6945 OT Individual Time Calculation (min): 60 min     Hospital Problem: Principal Problem:   Below-knee amputation of left lower extremity ([place])   Past Medical History:  Past Medical History:  Diagnosis Date   Anxiety    Aortic stenosis    Cellulitis and abscess of lower extremity 06/11/2019   Cellulitis of fourth toe of left foot    Cholelithiasis    Coronary artery disease    Nonobstructive CAD (40-50% LAD) 08/2019   Depression    Depression    Phreesia 09/27/2020   Depression    Phreesia 11/01/2020   Diabetes mellitus without complication ([place])    Phreesia 09/27/2020   Elevated troponin level not due myocardial infarction 11/11/2019   Essential hypertension    Gangrene of toe of left foot ([place]) 07/06/2019   Heart murmur    Phreesia 09/27/2020   Hyperlipidemia    Phreesia 09/27/2020   Hypertension    Phreesia 09/27/2020   Mixed hyperlipidemia    Morbid obesity ([place])    S/P aortic valve replacement with mechanical valve 12/05/2019   25 mm Carbomedics top hat bileaflet mechanical valve via partial upper hemi-sternotomy   Severe aortic stenosis 09/24/2019   Type 2 diabetes mellitus ([place])    Past Surgical History:  Past Surgical History:  Procedure Laterality Date   ABDOMINAL AORTOGRAM W/LOWER EXTREMITY N/A 07/06/2019   Procedure: ABDOMINAL AORTOGRAM W/LOWER EXTREMITY;  Surgeon: Elam Dutch, MD;  Location: [facility];  Service: Cardiovascular;  Laterality: N/A;  Bilateral   AMPUTATION Left 07/09/2019   Procedure: LEFT FOURTH and Fifth TOE AMPUTATION.;  Surgeon: Rosetta Posner, MD;  Location: [facility];  Service: Vascular;  Laterality: Left;   AMPUTATION  Left 03/11/2021   Procedure: LEFT FOOT 5TH  AND 4TH RAY AMPUTATION;  Surgeon: Newt Minion, MD;  Location: [facility];  Service: Orthopedics;  Laterality: Left;   AMPUTATION Left 08/28/2021   Procedure: AMPUTATION BELOW KNEE;  Surgeon: Newt Minion, MD;  Location: [facility];  Service: Orthopedics;  Laterality: Left;   AORTIC VALVE REPLACEMENT N/A 12/05/2019   Procedure: PARTIAL STERNOTOMY FOR AORTIC VALVE REPLACEMENT (AVR), USING CARBOMEDICS SUPRA-ANNULAR TOP HAT 25MM;  Surgeon: Rexene Alberts, MD;  Location: [facility];  Service: Open Heart Surgery;  Laterality: N/A;  No neck lines on left   CARDIAC VALVE REPLACEMENT N/A    Phreesia 09/27/2020   IR RADIOLOGY PERIPHERAL GUIDED IV START  10/05/2019   IR US GUIDE VASC ACCESS RIGHT  10/05/2019   MULTIPLE EXTRACTIONS WITH ALVEOLOPLASTY N/A 10/26/2019   Procedure: EXTRACTION OF TOOTH #'S 3, 5-11,19-28,  AND 32 WITH ALVEOLOPLASTY;  Surgeon: Lenn Cal, DDS;  Location: [facility];  Service: Oral Surgery;  Laterality: N/A;   RIGHT HEART CATH AND CORONARY ANGIOGRAPHY N/A 09/24/2019   Procedure: RIGHT HEART CATH AND CORONARY ANGIOGRAPHY;  Surgeon: Sherren Mocha, MD;  Location: [facility];  Service: Cardiovascular;  Laterality: N/A;   TEE WITHOUT CARDIOVERSION N/A 12/05/2019   Procedure: TRANSESOPHAGEAL ECHOCARDIOGRAM (TEE);  Surgeon: Rexene Alberts, MD;  Location: [facility];  Service: Open Heart Surgery;  Laterality: N/A;   TEE WITHOUT CARDIOVERSION N/A 09/01/2021   Procedure: TRANSESOPHAGEAL ECHOCARDIOGRAM (TEE);  Surgeon: Donato Heinz, MD;  [facility];  Service: Cardiovascular;  Laterality: N/A;    Assessment & Plan Clinical Impression: Adam Long is a 45 year old male with history of T2DM, HTN, mechanical AVR/non-obst CAD-on coumadin, diabetic foot ulcer with gangrenous changes, subacute osteomyelitis s/p ray amputation 06/22 who was admitted on 08/27/21 with one week history of increase in edema and erythema, progressive drainage  and some chills with nausea. He was transitioned to IV heparin in anticipation of surgery as foot not felt to be viable and he was started on broad spectrum antibiotics. He underwent BKA by Dr. Sharol Given on 12/02. Blood cultures done showed MSSA bacteremia and TEE done which was negative for endocarditis. Dr. Juleen China recommends felt that bacteremia secondary to foot infection and antibiotics narrowed to cefazolin with end date of 09/24/21 to complete 4 weeks antibiotic regimen. Therapy has been ongoing and patient has had decline in ability to complete ADLs and decline in mobility with fatigue. CIR recommended due to functional decline..  Patient transferred to CIR on 09/02/2021 .    Patient currently requires min with basic self-care skills secondary to muscle weakness and decreased standing balance and decreased balance strategies.  Prior to hospitalization, patient was fully independent, working, driving.   Patient will benefit from skilled intervention to increase independence with basic self-care skills prior to discharge home with care partner.  Anticipate patient will require intermittent supervision and no further OT follow recommended.  OT - End of Session Activity Tolerance: Tolerates 10 - 20 min activity with multiple rests Endurance Deficit: Yes OT Assessment Rehab Potential (ACUTE ONLY): Excellent OT Barriers to Discharge: Inaccessible home environment OT Barriers to Discharge Comments: stairs to enter home OT Patient demonstrates impairments in the following area(s): Balance;Endurance;Pain;Motor OT Basic ADL's Functional Problem(s): Bathing;Dressing;Toileting OT Transfers Functional Problem(s): Toilet;Tub/Shower OT Additional Impairment(s): None OT Plan OT Intensity: Minimum of 1-2 x/day, 45 to 90 minutes OT Frequency: 5 out of 7 days OT Duration/Estimated Length of Stay: 7-9 days OT Treatment/Interventions: Balance/vestibular training;Discharge planning;DME/adaptive equipment  instruction;Functional mobility training;Pain management;Patient/family education;Psychosocial support;Self Care/advanced ADL retraining;Therapeutic Activities;Therapeutic Exercise;UE/LE Strength taining/ROM OT Self Feeding Anticipated Outcome(s): no goal, pt is independent OT Basic Self-Care Anticipated Outcome(s): Mod I OT Toileting Anticipated Outcome(s): Mod I OT Bathroom Transfers Anticipated Outcome(s): Mod I OT Recommendation Patient destination: Home Follow Up Recommendations: None Equipment Recommended: To be determined   OT Evaluation Precautions/Restrictions  Precautions Precautions: Fall;Other (comment) Required Braces or Orthoses: Other Brace Other Brace: limb protector Restrictions Weight Bearing Restrictions: Yes LLE Weight Bearing: Non weight bearing Other Position/Activity Restrictions: L limb protector   Pain Pain Assessment 6/10 in L limb, reported to RN pt requesting pain medication Home Living/Prior [place] expects to be discharged to:: Private residence Living Arrangements: Other relatives Available Help at Discharge: Family, Industrial/product designer, Available PRN/intermittently (uncle and nephew work, other family members available) Type of Home: House Home Access: Stairs to enter Technical brewer of Steps: 3 Entrance Stairs-Rails: Left Home Layout: Two level, Able to live on main level with bedroom/bathroom Alternate Level Stairs-Number of Steps: 10 to 15 Alternate Level Stairs-Rails: Right, Left, Can reach both  Lives With: Family Prior Function Level of Independence: Independent with gait, Independent with transfers, Independent with homemaking with ambulation  Able to Take Stairs?: Yes Driving: Yes Vocation: Full time employment Vocation Requirements: mostly desk work, light-mod lifting at times Vision Baseline Vision/History: 1 Wears glasses Ability to See in Adequate Light: 0 Adequate Patient Visual Report: No change  from baseline Vision Assessment?: No apparent  visual deficits Perception  Perception: Within Functional Limits Praxis Praxis: Intact Cognition Overall Cognitive Status: Within Functional Limits for tasks assessed Arousal/Alertness: Awake/alert Orientation Level: Person;Place;Situation Person: Oriented Place: Oriented Situation: Oriented Year: 2022 Month: December Day of Week: Correct Memory: Appears intact Immediate Memory Recall: Sock;Blue;Bed Memory Recall Sock: Without Cue Memory Recall Blue: Without Cue Memory Recall Bed: Without Cue Awareness: Appears intact Problem Solving: Appears intact Safety/Judgment: Appears intact Sensation Sensation Light Touch: Appears Intact Hot/Cold: Appears Intact Proprioception: Appears Intact Stereognosis: Appears Intact Coordination Gross Motor Movements are Fluid and Coordinated: No Fine Motor Movements are Fluid and Coordinated: Yes Coordination and Movement Description: Generalized weakness and COM shift d/t recent amputation Motor  Motor Motor: Within Functional Limits Motor - Skilled Clinical Observations: generalized weakness post surgery  Trunk/Postural Assessment  Cervical Assessment Cervical Assessment: Within Functional Limits Thoracic Assessment Thoracic Assessment: Within Functional Limits Lumbar Assessment Lumbar Assessment: Within Functional Limits Postural Control Postural Control: Within Functional Limits  Balance Dynamic Sitting Balance Dynamic Sitting - Level of Assistance: 5: Stand by assistance Static Standing Balance Static Standing - Level of Assistance: 5: Stand by assistance (with UE support) Dynamic Standing Balance Dynamic Standing - Level of Assistance: 4: Min assist (with UE support) Extremity/Trunk Assessment RUE Assessment RUE Assessment: Within Functional Limits LUE Assessment LUE Assessment: Within Functional Limits  Care Tool Care Tool Self Care Eating   Eating Assist Level:  Independent    Oral Care    Oral Care Assist Level: Independent    Bathing   Body parts bathed by patient: Right arm;Left arm;Chest;Abdomen;Front perineal area;Right upper leg;Left upper leg;Face;Right lower leg Body parts bathed by helper: Buttocks Body parts n/a: Left lower leg Assist Level: Minimal Assistance - Patient > 75%    Upper Body Dressing(including orthotics)   What is the patient wearing?: Pull over shirt   Assist Level: Independent    Lower Body Dressing (excluding footwear)   What is the patient wearing?: Underwear/pull up;Pants Assist for lower body dressing: Minimal Assistance - Patient > 75%    Putting on/Taking off footwear   What is the patient wearing?: Non-skid slipper socks Assist for footwear: Set up assist       Care Tool Toileting Toileting activity   Assist for toileting: Minimal Assistance - Patient > 75%     Care Tool Bed Mobility Roll left and right activity   Roll left and right assist level: Independent    Sit to lying activity   Sit to lying assist level: Independent    Lying to sitting on side of bed activity   Lying to sitting on side of bed assist level: the ability to move from lying on the back to sitting on the side of the bed with no back support.: Independent     Care Tool Transfers Sit to stand transfer   Sit to stand assist level: Contact Guard/Touching assist Sit to stand assistive device: Walker  Chair/bed transfer   Chair/bed transfer assist level: Minimal Assistance - Patient > 75%     Toilet transfer   Assist Level: Minimal Assistance - Patient > 75%     Care Tool Cognition  Expression of Ideas and Wants Expression of Ideas and Wants: 4. Without difficulty (complex and basic) - expresses complex messages without difficulty and with speech that is clear and easy to understand  Understanding Verbal and Non-Verbal Content Understanding Verbal and Non-Verbal Content: 4. Understands (complex and basic) - clear  comprehension without cues or repetitions   Memory/Recall Ability Memory/Recall Ability :  Current season;Location of own room;Staff names and faces;That he or she is in a hospital/hospital unit   Refer to Care Plan for [place] 1 OT Short Term Goal 1 (Week 1): STGs = LTGs  Recommendations for other services: None    Skilled Therapeutic Intervention ADL ADL Eating: Independent Grooming: Independent Upper Body Bathing: Setup Lower Body Bathing: Minimal assistance Where Assessed-Lower Body Bathing: Edge of bed Upper Body Dressing: Independent Lower Body Dressing: Contact guard Where Assessed-Lower Body Dressing: Edge of bed Toileting: Minimal assistance Toilet Transfer: Minimal assistance    Pt seen for initial evaluation and ADL training with a focus on balance and adjustments to his new center of gravity with limb loss. Explained role of OT and discussed pt's goals. He is very motivated to become independent again.  Pt connected to PICC line so focused on LB self care.  Had pt come to sit to EOB with bed flat and no rails with no A needed.  He was able to complete several sit to stands to RW all with light CGA.  He sat on bed at angle to prop R foot up to wash leg, don pants and sock over leg. Did very well managing clothing over L limb demonstrating good awareness of length of limb.  He reports he does have some phantom sensations. Reviewed safe reaching strategies with keeping 1 hand supported all the time for balance compensation in sitting.  Pt able to use RW to stand pivot to recliner and lower to low recliner with min A.  Pt has low toilet in bathroom, obtained a bariatric BSC to put over toilet. Pt reports at home his toilet is elevated. Pt resting in recliner with all needs met, chair alarm on.   Discharge Criteria: Patient will be discharged from OT if patient refuses treatment 3 consecutive times without medical reason, if treatment goals not met, if  there is a change in medical status, if patient makes no progress towards goals or if patient is discharged from hospital.  The above assessment, treatment plan, treatment alternatives and goals were discussed and mutually agreed upon: by patient  [facility] 09/03/2021, 12:31 PM

## 2021-09-04 DIAGNOSIS — S88112A Complete traumatic amputation at level between knee and ankle, left lower leg, initial encounter: Secondary | ICD-10-CM | POA: Diagnosis not present

## 2021-09-04 LAB — BASIC METABOLIC PANEL
Anion gap: 8 (ref 5–15)
BUN: 16 mg/dL (ref 6–20)
CO2: 26 mmol/L (ref 22–32)
Calcium: 8.5 mg/dL — ABNORMAL LOW (ref 8.9–10.3)
Chloride: 99 mmol/L (ref 98–111)
Creatinine, Ser: 0.65 mg/dL (ref 0.61–1.24)
GFR, Estimated: >60 mL/min (ref >60)
Glucose, Bld: 129 mg/dL — ABNORMAL HIGH (ref 70–99)
Potassium: 4.1 mmol/L (ref 3.5–5.1)
Sodium: 133 mmol/L — ABNORMAL LOW (ref 135–145)

## 2021-09-04 LAB — GLUCOSE, CAPILLARY
Glucose-Capillary: 135 mg/dL — ABNORMAL HIGH (ref 70–99)
Glucose-Capillary: 114 mg/dL — ABNORMAL HIGH (ref 70–99)
Glucose-Capillary: 147 mg/dL — ABNORMAL HIGH (ref 70–99)
Glucose-Capillary: 121 mg/dL — ABNORMAL HIGH (ref 70–99)

## 2021-09-04 LAB — PROTIME-INR
INR: 1.6 — ABNORMAL HIGH (ref 0.8–1.2)
Prothrombin Time: 19.2 seconds — ABNORMAL HIGH (ref 11.4–15.2)

## 2021-09-04 MED ORDER — WARFARIN SODIUM 5 MG PO TABS
16.0000 mg | ORAL_TABLET | Freq: Once | ORAL | Status: AC
  Administered 2021-09-04: 16 mg via ORAL
  Filled 2021-09-04: qty 2

## 2021-09-04 NOTE — Progress Notes (Signed)
Patient ID: Adam Long, male   DOB: March 30, 1976, 45 y.o.   MRN: [id number] Have begun to look for home health agency to accept referral for RN and PT have not found one as of yet to take his insurance and service his area [place]. May need to just have [facility] and then go to OPPT. Pt made aware of this.

## 2021-09-04 NOTE — Progress Notes (Signed)
PROGRESS NOTE   Subjective/Complaints:  Pt didn't recognize me this AM- No issues except having some pain this AM- already received pain meds a few minutes prior- haven't kicked in yet.  LBM 2 days ago, however feels like could go today.    ROS:  Pt denies SOB, abd pain, CP, N/V/C/D, and vision changes  Objective:   No results found. Recent Labs    09/02/21 0950 09/03/21 0459  WBC 14.3* 10.7*  HGB 11.4* 10.8*  HCT 36.2* 33.3*  PLT 407* 348   Recent Labs    09/03/21 0459 09/04/21 0513  NA 128* 133*  K 4.1 4.1  CL 98 99  CO2 25 26  GLUCOSE 147* 129*  BUN 15 16  CREATININE 0.60* 0.65  CALCIUM 8.5* 8.5*    Intake/Output Summary (Last 24 hours) at 09/04/2021 0840 Last data filed at 09/04/2021 0747 Gross per 24 hour  Intake 780 ml  Output 2095 ml  Net -1315 ml        Physical Exam: Vital Signs Blood pressure 122/78, pulse 76, temperature 97.9 F (36.6 C), temperature source Oral, resp. rate 18, height 5\' 10"  (1.778 m), weight (!) 158.2 kg, SpO2 97 %.    General: awake, alert, appropriate, BMI 50; sitting up in bed; 100% tray;  NAD HENT: conjugate gaze; oropharynx moist CV: regular rate; no JVD Pulmonary: CTA B/L; no W/R/R- good air movement GI: soft, NT, ND, (+)BS- slightly hypoactive Psychiatric: appropriate- more interactive Neurological: Ox3 Skin: L BKA- dressed C/D/I- with ACE wrap in place- no VAC  Assessment/Plan: 1. Functional deficits which require 3+ hours per day of interdisciplinary therapy in a comprehensive inpatient rehab setting. Physiatrist is providing close team supervision and 24 hour management of active medical problems listed below. Physiatrist and rehab team continue to assess barriers to discharge/monitor patient progress toward functional and medical goals  Care Tool:  Bathing    Body parts bathed by patient: Right arm, Left arm, Chest, Abdomen, Front perineal area,  Right upper leg, Left upper leg, Face, Right lower leg   Body parts bathed by helper: Buttocks Body parts n/a: Left lower leg   Bathing assist Assist Level: Minimal Assistance - Patient > 75%     Upper Body Dressing/Undressing Upper body dressing   What is the patient wearing?: Pull over shirt    Upper body assist Assist Level: Independent    Lower Body Dressing/Undressing Lower body dressing      What is the patient wearing?: Underwear/pull up, Pants     Lower body assist Assist for lower body dressing: Minimal Assistance - Patient > 75%     Toileting Toileting    Toileting assist Assist for toileting: Independent with assistive device Assistive Device Comment: Rolling walker   Transfers Chair/bed transfer  Transfers assist     Chair/bed transfer assist level: Contact Guard/Touching assist Chair/bed transfer assistive device:   Ambulation assist      Assist level: Contact Guard/Touching assist Assistive device: Walker-rolling Max distance: 20   Walk 10 feet activity   Assist     Assist level: Contact Guard/Touching assist Assistive device: Walker-rolling   Walk 50 feet activity  Assist Walk 50 feet with 2 turns activity did not occur: Safety/medical concerns         Walk 150 feet activity   Assist Walk 150 feet activity did not occur: Safety/medical concerns         Walk 10 feet on uneven surface  activity   Assist Walk 10 feet on uneven surfaces activity did not occur: Safety/medical concerns         Wheelchair     Assist Is the patient using a wheelchair?: Yes Type of Wheelchair: Manual    Wheelchair assist level: Supervision/Verbal cueing Max wheelchair distance: 80 ft    Wheelchair 50 feet with 2 turns activity    Assist        Assist Level: Supervision/Verbal cueing   Wheelchair 150 feet activity     Assist      Assist Level: Minimal Assistance - Patient > 75%    Blood pressure 122/78, pulse 76, temperature 97.9 F (36.6 C), temperature source Oral, resp. rate 18, height 5\' 10"  (1.778 m), weight (!) 158.2 kg, SpO2 97 %.  Medical Problem List and Plan: 1. Functional deficits secondary to L BKA             -patient may shower but incision must be covered             -ELOS/Goals: 10-14 days S  12/9- con't CIR_ PT and OT- preprosthetic training.  2.  Mechanical Aortic valve/Antithrombotics: -DVT/anticoagulation:  Pharmaceutical: Coumadin with lovenox bridge till INR>2.5 12/9- INR still 1.6- per pharmacy             -antiplatelet therapy: N/A 3. Residual limb pain: continue Oxycodone prn.  4. Mood: LCSW to follow for evaluation and support.              -antipsychotic agents: N/A 5. Neuropsych: This patient is capable of making decisions on his own behalf. 6. Skin/Wound Care: Routine pressure relief measures.              --wound VAC come off POD# 7 on 12/09  12/8- MASD in groin- WOC started silver antimicrobial wicking fabric  12/9- wound VAC is off- con't dressings- will order shrinkers from [employer] with prosthetic education- with his BMI of 50, will have a harder time with BKA/prosthesis.  7. Fluids/Electrolytes/Nutrition: Monitor I/O. Check CMET in am.  8. Sepsis secondary to MSSA bacteremia: continue IV cefazolin with end date 09/24/21.             --weekly labs 9. T2DM: Uncontrolled with A1C-7.8. Monitor BS ac/hs and use SSI for elevated BS             --start metformin 250mg  daily             --continue insulin glargine 15 units (was on 30 units PTA) with SSI for elevated BS.    12/9- CBGs 113-159- con't regimen- is controlled 10. HTN: Monitor BP TID--has been running low             --Lisinopril 10 mg with metoprolol bid. Monitor for orthostatic changes.   12/8- BP a little soft- no hypotensive Sx's- con't regimen  12/9- BP better today- con't regimen 11. Super morbid obesity: BMI 54.5. will consult dietician to educate on diet --Educate  on weight loss and dietary changes to help promote overall health and mobility.   12/9- BMI down to 50- con't education 12. Hyponatremia: Recheck lytes in am. 12/8- Na 128- will recheck in AM- if still <130, will  put on fluid restriction  12/9- Na back up to 133- will recheck weekly and prn.  13. Malnutrition: Albumin has dropped from 3.2-->2.7 today.             --add protein supplement TID. 14. Leucocytosis:Has been fluctuating 15.3-->12.3-->14.3  12/8- WBC down to 10.7- so doing much better- con't to monitor --Monitor for fevers and trends.     LOS: 2 days A FACE TO FACE EVALUATION WAS PERFORMED  Tamarah Bhullar 09/04/2021, 8:40 AM

## 2021-09-04 NOTE — Progress Notes (Signed)
Physical Therapy Session Note  Patient Details  Name: Adam Long MRN: [id number] Date of Birth: [birth year]  Today's Date: 09/04/2021 PT Group Time: 1030-1120 PT Group Time Calculation (min): 50 min  Short Term Goals: Week 1:  PT Short Term Goal 1 (Week 1): =LTGs d/t ELOS   Skilled Therapeutic Interventions/Progress Updates:   Pt received sitting in WC and agreeable to PT following transport to orthogym from rehab tech. PT instructed pt in group therapy to perform UE circuit training with random dice roll to perform shoulder press, chest press, bicep curls, shoulder fly, shoulder row, punches, each completed 10-20reps intermittently 4 exercises x 3 bouts. Exercises ball to perform either overhead lift, trunk rotation, partial crunch, back bend 2 ronds x 4 performing 8-16 reps intermittently. Pt's also performed UE stretch for delts 2 x 10 sec, wrist extension, and shoulder circles anterior/posterior x 20sec each. Patient returned to room and left sitting in [facility] with call bell in reach and all needs met.         Therapy Documentation Precautions:  Precautions Precautions: Fall, Other (comment) Required Braces or Orthoses: Other Brace Other Brace: limb protector Restrictions Weight Bearing Restrictions: Yes LLE Weight Bearing: Non weight bearing Other Position/Activity Restrictions: L limb protector  Pain: Pain Assessment Pain Score: 0-No pain    Therapy/Group: Individual Therapy  Lorie Phenix 09/04/2021, 12:52 PM

## 2021-09-04 NOTE — Progress Notes (Signed)
Occupational Therapy Session Note  Patient Details  Name: Adam Long MRN: [id number] Date of Birth: August 14, 1976  Today's Date: 09/05/2021 OT Individual Time: 4315-4008 OT Individual Time Calculation (min): 56 min    Short Term Goals: Week 1:  OT Short Term Goal 1 (Week 1): STGs = LTGs  Skilled Therapeutic Interventions/Progress Updates:    Pt greeted in bed, premedicated for residual limb pain. Supine<sit from flat bed without bedrail completed with setup assistance, per setup at home. He then ambulated using RW with supervision assistance to the w/c parked in front of sink. Setup for UB self care and also oral care using rinse. He then reported needing to use the restroom. Ambulatory transfer completed with supervision assist using RW. Pt able to complete 3/3 components of toileting today with supervision only! We celebrated! Pt able to lean successfully to complete hygiene post BM void. Also discussed independent purchase of bidet to increase hygiene thoroughness if needed at home. Pt then returned to EOB and completed LB self care at sit<stand level using RW. OT brought in extra wide sock aide + reacher to increase functional independence with pt successfully using both pieces of AE with supervision. He is aware that he would need to independently purchase these items for home. Pt propped his Rt foot up on the bed to don shoe and tie laces. Pt doffed his limb guard and donned it again with Min A and vcs. Pt also used the [facility] inspection mirror to look at the bottom of his Rt foot for preventive care. RW used for stand pivot<recliner. He remained sitting up, left with all needs within reach and safety belt fastened. Tx focus placed today on adaptive self care skills, amputee education, functional transfers and activity tolerance.   Therapy Documentation Precautions:  Precautions Precautions: Fall, Other (comment) Required Braces or Orthoses: Other Brace Other Brace: limb  protector Restrictions Weight Bearing Restrictions: Yes LLE Weight Bearing: Non weight bearing Other Position/Activity Restrictions: L limb protector  ADL: ADL Eating: Independent Grooming: Independent Upper Body Bathing: Setup Lower Body Bathing: Minimal assistance Where Assessed-Lower Body Bathing: Edge of bed Upper Body Dressing: Independent Lower Body Dressing: Contact guard Where Assessed-Lower Body Dressing: Edge of bed Toileting: Minimal assistance Toilet Transfer: Minimal assistance Therapy/Group: Individual Therapy  Yeng Frankie A Harue Pribble 09/05/2021, 1:31 PM

## 2021-09-04 NOTE — Progress Notes (Signed)
Occupational Therapy Session Note  Patient Details  Name: Adam Long MRN: [id number] Date of Birth: 04/15/1976  Today's Date: 09/04/2021 OT Individual Time: 1135-1200 OT Individual Time Calculation (min): 25 min    Short Term Goals: Week 1:  OT Short Term Goal 1 (Week 1): STGs = LTGs  Skilled Therapeutic Interventions/Progress Updates:  Skilled OT intervention completed with focus on amputation education, and BUE strengthening. Pt received seated in w/c, agreeable to session. Requested assist to transfer to recliner, with pt able to sit > stand and stand pivot with RW and CGA. Pt completed BUE strengthening exercises to promote increased strength needed for functional transfers including the following with 5 pound dumbbell: bicep flexion 2x10 each arm, Chest presses 2x10 each arm. Cues needed for form and technique. Rest breaks provided intermittently due to decreased endurance, with education provided about amputation statistics, contracture prevention methods- with pt provided educational pamphlet for further reading on his own time. Pt left seated in recliner, with chair alarm on and set up for lunch, and all needs in reach at end of session.   Therapy Documentation Precautions:  Precautions Precautions: Fall, Other (comment) Required Braces or Orthoses: Other Brace Other Brace: limb protector Restrictions Weight Bearing Restrictions: Yes LLE Weight Bearing: Non weight bearing Other Position/Activity Restrictions: L limb protector  Pain: No c/o pain   Therapy/Group: Individual Therapy  Adam Long 09/04/2021, 1:33 PM

## 2021-09-04 NOTE — Progress Notes (Signed)
Orthopedic Tech Progress Note Patient Details:  Adam Long [birth year] [id number]  Patient ID: [name], male   DOB: 15-Mar-1976, 45 y.o.   MRN: [id number]  [name] 09/04/2021, 9:04 AM Called in order from [employer].

## 2021-09-04 NOTE — Progress Notes (Signed)
Physical Therapy Session Note  Patient Details  Name: Adam Long MRN: [id number] Date of Birth: 05-09-1976  Today's Date: 09/04/2021 PT Individual Time: 1350-1429 PT Individual Time Calculation (min): 39 min   Short Term Goals: Week 1:  PT Short Term Goal 1 (Week 1): =LTGs d/t ELOS  Skilled Therapeutic Interventions/Progress Updates:   Pt received sitting in recliner and agreeable to PT. Stand pivot transfer to [facility] with CGA and BUE supported on RW. Cues for increased use of BUE in step to reduce impact on sound RLE in heel contact.   Sit<>stand in parallel bars with supervision assist with cues for UE placement. Gait training in parallel bars with CGA and cues for improve UE use and swing foot forward rather than "hop" on the RLE.  Stepping up/down 2 inch step x 4 with min assist from PT with instruction as listed above for improved use of  BUE to protect the RLE. Performed on 4inchstep x 2 and 6 inch step x 1 with CGA-min assist throughout for safety.  PT educated pt on mechanical advantange to ascend steps posteriorly compared to anterior due to position of BUE on RW when on elevated step height. PT also educated pt on access to house straight to chair from sitting in [facility] if needed.   WC mobility with supervision assist from PT for safety with cues for doorway management into gym and pt room 2 x 117f.   Patient returned to room and performed stand pivot to recliner with CGA for safety and UE supported on RW. Pt left sitting in recliner with call bell in reach and all needs met.         Therapy Documentation Precautions:  Precautions Precautions: Fall, Other (comment) Required Braces or Orthoses: Other Brace Other Brace: limb protector Restrictions Weight Bearing Restrictions: Yes LLE Weight Bearing: Non weight bearing Other Position/Activity Restrictions: L limb protector    Vital Signs: Therapy Vitals Temp: 98.6 F (37 C) Pulse Rate: 94 Resp: 18 BP: 113/74 Patient  Position (if appropriate): Sitting Oxygen Therapy SpO2: 99 % O2 Device: Room Air Pain: Pain Assessment Pain Score: 0-No pain    Therapy/Group: Individual Therapy  ALorie Phenix12/05/2021, 2:29 PM

## 2021-09-05 DIAGNOSIS — S88112A Complete traumatic amputation at level between knee and ankle, left lower leg, initial encounter: Secondary | ICD-10-CM | POA: Diagnosis not present

## 2021-09-05 LAB — GLUCOSE, CAPILLARY
Glucose-Capillary: 125 mg/dL — ABNORMAL HIGH (ref 70–99)
Glucose-Capillary: 148 mg/dL — ABNORMAL HIGH (ref 70–99)
Glucose-Capillary: 136 mg/dL — ABNORMAL HIGH (ref 70–99)
Glucose-Capillary: 177 mg/dL — ABNORMAL HIGH (ref 70–99)

## 2021-09-05 LAB — PROTIME-INR
INR: 1.5 — ABNORMAL HIGH (ref 0.8–1.2)
Prothrombin Time: 18 seconds — ABNORMAL HIGH (ref 11.4–15.2)

## 2021-09-05 MED ORDER — WARFARIN SODIUM 7.5 MG PO TABS
18.0000 mg | ORAL_TABLET | Freq: Once | ORAL | Status: AC
  Administered 2021-09-05: 18 mg via ORAL
  Filled 2021-09-05: qty 1

## 2021-09-05 MED ORDER — SODIUM CHLORIDE 0.9% FLUSH
10.0000 mL | INTRAVENOUS | Status: DC | PRN
  Administered 2021-09-05 – 2021-09-08 (×3): 10 mL

## 2021-09-05 MED ORDER — SODIUM CHLORIDE 0.9% FLUSH
10.0000 mL | Freq: Two times a day (BID) | INTRAVENOUS | Status: DC
Start: 2021-09-05 — End: 2021-09-14
  Administered 2021-09-05 – 2021-09-14 (×12): 10 mL

## 2021-09-05 NOTE — Progress Notes (Signed)
PROGRESS NOTE   Subjective/Complaints:  Needs to poop- called for nursing.  No issues- Didn't go yesterday/have BM- but going to go now.    ROS:  Pt denies SOB, abd pain, CP, N/V/C/D, and vision changes   Objective:   No results found. Recent Labs    09/02/21 0950 09/03/21 0459  WBC 14.3* 10.7*  HGB 11.4* 10.8*  HCT 36.2* 33.3*  PLT 407* 348   Recent Labs    09/03/21 0459 09/04/21 0513  NA 128* 133*  K 4.1 4.1  CL 98 99  CO2 25 26  GLUCOSE 147* 129*  BUN 15 16  CREATININE 0.60* 0.65  CALCIUM 8.5* 8.5*    Intake/Output Summary (Last 24 hours) at 09/05/2021 0925 Last data filed at 09/05/2021 0700 Gross per 24 hour  Intake 660 ml  Output 1750 ml  Net -1090 ml        Physical Exam: Vital Signs Blood pressure 128/90, pulse 79, temperature 98.2 F (36.8 C), temperature source Oral, resp. rate 18, height 5\' 10"  (1.778 m), weight (!) 158.2 kg, SpO2 99 %.     General: awake, alert, appropriate, BMI 50; sitting up in bed; NAD HENT: conjugate gaze; oropharynx moist CV: regular rate; no JVD Pulmonary: CTA B/L; no W/R/R- good air movement GI: soft, NT, ND, (+)BS Psychiatric: appropriate; less interactive Neurological: Ox3; appropriate Skin: L BKA- dressed C/D/I- with ACE wrap in place- no VAC- limb guard in place  Assessment/Plan: 1. Functional deficits which require 3+ hours per day of interdisciplinary therapy in a comprehensive inpatient rehab setting. Physiatrist is providing close team supervision and 24 hour management of active medical problems listed below. Physiatrist and rehab team continue to assess barriers to discharge/monitor patient progress toward functional and medical goals  Care Tool:  Bathing    Body parts bathed by patient: Right arm, Left arm, Chest, Abdomen, Front perineal area, Right upper leg, Left upper leg, Face, Right lower leg   Body parts bathed by helper:  Buttocks Body parts n/a: Left lower leg   Bathing assist Assist Level: Minimal Assistance - Patient > 75%     Upper Body Dressing/Undressing Upper body dressing   What is the patient wearing?: Pull over shirt    Upper body assist Assist Level: Set up assist    Lower Body Dressing/Undressing Lower body dressing      What is the patient wearing?: Underwear/pull up     Lower body assist Assist for lower body dressing: Minimal Assistance - Patient > 75%     Toileting Toileting    Toileting assist Assist for toileting: Maximal Assistance - Patient 25 - 49% Assistive Device Comment: Rolling walker   Transfers Chair/bed transfer  Transfers assist     Chair/bed transfer assist level: Minimal Assistance - Patient > 75% Chair/bed transfer assistive device: assist      Assist level: Contact Guard/Touching assist Assistive device: Walker-rolling Max distance: 20   Walk 10 feet activity   Assist     Assist level: Contact Guard/Touching assist Assistive device: Walker-rolling   Walk 50 feet activity   Assist Walk 50 feet with 2 turns activity did  not occur: Safety/medical concerns         Walk 150 feet activity   Assist Walk 150 feet activity did not occur: Safety/medical concerns         Walk 10 feet on uneven surface  activity   Assist Walk 10 feet on uneven surfaces activity did not occur: Safety/medical concerns         Wheelchair     Assist Is the patient using a wheelchair?: Yes Type of Wheelchair: Manual    Wheelchair assist level: Supervision/Verbal cueing Max wheelchair distance: 80 ft    Wheelchair 50 feet with 2 turns activity    Assist        Assist Level: Supervision/Verbal cueing   Wheelchair 150 feet activity     Assist      Assist Level: Minimal Assistance - Patient > 75%   Blood pressure 128/90, pulse 79, temperature 98.2 F (36.8 C), temperature  source Oral, resp. rate 18, height 5\' 10"  (1.778 m), weight (!) 158.2 kg, SpO2 99 %.  Medical Problem List and Plan: 1. Functional deficits secondary to L BKA             -patient may shower but incision must be covered             -ELOS/Goals: 10-14 days S  12/10- con't PT and OT- CIR 2.  Mechanical Aortic valve/Antithrombotics: -DVT/anticoagulation:  Pharmaceutical: Coumadin with lovenox bridge till INR>2.5 12/10- INR 1.5- per pharmacy             -antiplatelet therapy: N/A 3. Residual limb pain: continue Oxycodone prn.  4. Mood: LCSW to follow for evaluation and support.              -antipsychotic agents: N/A 5. Neuropsych: This patient is capable of making decisions on his own behalf. 6. Skin/Wound Care: Routine pressure relief measures.              --wound VAC come off POD# 7 on 12/09  12/8- MASD in groin- WOC started silver antimicrobial wicking fabric  12/9- wound VAC is off- con't dressings- will order shrinkers from [employer] with prosthetic education- with his BMI of 50, will have a harder time with BKA/prosthesis.  7. Fluids/Electrolytes/Nutrition: Monitor I/O. Check CMET in am.  8. Sepsis secondary to MSSA bacteremia: continue IV cefazolin with end date 09/24/21.             --weekly labs 9. T2DM: Uncontrolled with A1C-7.8. Monitor BS ac/hs and use SSI for elevated BS             --start metformin 250mg  daily             --continue insulin glargine 15 units (was on 30 units PTA) with SSI for elevated BS.    12/10- CBGs well controlled con't regimen 10. HTN: Monitor BP TID--has been running low             --Lisinopril 10 mg with metoprolol bid. Monitor for orthostatic changes.   12/10- BP well controlled- con't regimen 11. Super morbid obesity: BMI 54.5. will consult dietician to educate on diet --Educate on weight loss and dietary changes to help promote overall health and mobility.   12/9- BMI down to 50- con't education 12. Hyponatremia: Recheck lytes in am. 12/8- Na  128- will recheck in AM- if still <130, will put on fluid restriction  12/9- Na back up to 133- will recheck weekly and prn.  13. Malnutrition: Albumin has dropped from 3.2-->2.7 today.             --  add protein supplement TID. 14. Leucocytosis:Has been fluctuating 15.3-->12.3-->14.3  12/8- WBC down to 10.7- so doing much better- con't to monitor --Monitor for fevers and trends.     LOS: 3 days A FACE TO FACE EVALUATION WAS PERFORMED  Sonja Manseau 09/05/2021, 9:25 AM

## 2021-09-05 NOTE — IPOC Note (Signed)
Overall Plan of Care [facility]) Patient Details Name: Adam Long MRN: [id number] DOB: 1975/10/14  Admitting Diagnosis: Below-knee amputation of left lower extremity [facility])  Hospital Problems: Principal Problem:   Below-knee amputation of left lower extremity (HCC)     Functional Problem List: Nursing Edema, Endurance, Medication Management, Pain, Safety, Skin Integrity  PT Balance, Safety, Edema, Endurance, Skin Integrity, Motor, Pain  OT Balance, Endurance, Pain, Motor  SLP    TR         Basic ADL's: OT Bathing, Dressing, Toileting     Advanced  ADL's: OT       Transfers: PT Bed Mobility, Bed to Chair, Car  OT Toilet, Tub/Shower     Locomotion: PT Ambulation, Psychologist, prison and probation services, Stairs     Additional Impairments: OT None  SLP        TR      Anticipated Outcomes Item Anticipated Outcome  Self Feeding no goal, pt is independent  Swallowing      Basic self-care  Mod I  Toileting  Mod I   Bathroom Transfers Mod I  Bowel/Bladder  supervision  Transfers  mod I  Locomotion  supervision short distance gait, mod I w/c  Communication     Cognition     Pain  <3  Safety/Judgment  supervision   Therapy Plan: PT Intensity: Minimum of 1-2 x/day ,45 to 90 minutes PT Frequency: 5 out of 7 days PT Duration Estimated Length of Stay: 7-9 OT Intensity: Minimum of 1-2 x/day, 45 to 90 minutes OT Frequency: 5 out of 7 days OT Duration/Estimated Length of Stay: 7-9 days     Due to the current state of emergency, patients may not be receiving their 3-hours of Medicare-mandated therapy.   Team Interventions: Nursing Interventions Patient/Family Education, Disease Management/Prevention, Pain Management, Medication Management, Skin Care/Wound Management, Discharge Planning  PT interventions Ambulation/gait training, Discharge planning, DME/adaptive equipment instruction, Functional mobility training, Pain management, Psychosocial support, Therapeutic Activities,  UE/LE Strength taining/ROM, Visual/perceptual remediation/compensation, Warden/ranger, Community reintegration, Disease management/prevention, Functional electrical stimulation, Neuromuscular re-education, Patient/family education, Skin care/wound management, Stair training, Therapeutic Exercise, UE/LE Coordination activities, Wheelchair propulsion/positioning  OT Interventions Warden/ranger, Discharge planning, DME/adaptive equipment instruction, Functional mobility training, Pain management, Patient/family education, Psychosocial support, Self Care/advanced ADL retraining, Therapeutic Activities, Therapeutic Exercise, UE/LE Strength taining/ROM  SLP Interventions    TR Interventions    SW/CM Interventions Discharge Planning, Psychosocial Support, Patient/Family Education   Barriers to Discharge MD  Medical stability, Home enviroment access/loayout, Wound care, Lack of/limited family support, Weight, Weight bearing restrictions, and Medication compliance  Nursing Decreased caregiver support, IV antibiotics, Wound Care, Lack of/limited family support, Weight, Weight bearing restrictions, Medication compliance Lives in 2 level home with 2-3 steps to enter and left rail available. Able to live on main level with bedroom/bathroom. Aunt will be able to provide intermittent care.  PT Inaccessible home environment, Home environment access/layout, Decreased caregiver support, IV antibiotics, Weight, Weight bearing restrictions    OT Inaccessible home environment stairs to enter home  SLP      SW Inaccessible home environment, Lack of/limited family support, Weight, IV antibiotics, Insurance for SNF coverage     Team Discharge Planning: Destination: PT-Home ,OT- Home , SLP-  Projected Follow-up: PT-Home health PT, OT-  None, SLP-  Projected Equipment Needs: PT-Rolling walker with 5" wheels, Wheelchair (measurements), Wheelchair cushion (measurements), OT- To be determined,  SLP-  Equipment Details: PT-Bari RW and bari w/c, 22x18, OT-  Patient/family involved in discharge planning: PT- Patient,  OT-Patient, SLP-   MD ELOS: 7-9 days Medical Rehab Prognosis:  Good Assessment: Pt is a 45 yr old male with DM, BMI of 50, sepsis due to MSSA bacteremia and HTN s/p L BKA due to sepsis/necrosis- also has some hyponatremia and malnutrition.  Goals mod I to supervision      See Team Conference Notes for weekly updates to the plan of care

## 2021-09-05 NOTE — Progress Notes (Signed)
Physical Therapy Session Note  Patient Details  Name: Adam Long MRN: [id number] Date of Birth: 05/31/1976  Today's Date: 09/05/2021 PT Individual Time: 2979-8921, 1415-1520 PT Individual Time Calculation (min): 54 min, 65 min  Short Term Goals: Week 1:  PT Short Term Goal 1 (Week 1): =LTGs d/t ELOS  Skilled Therapeutic Interventions/Progress Updates:    Session 1: Pt received in recliner and agreeable to therapy.  Pt reports 5/10 pain, premedicated. Rest and positioning provided as needed. CGA for Sit to stand and Stand pivot transfer throughout session with RW. Pt propelled w/c with BUE throughout session with VC and supervision, including managing w/c parts.  Sit<>supine and rolling on mat table with supervision.  Pt performed the following exercises to promote LE strength and endurance: Supine SLR, BIL. VC for incr quad activation Supine hip abduction BIL VC for neutral rotation and glute med activation Sidelying hip abduction with VC for technique Sidelying hip abduction rainbows Prone hip extension Prone knee flexion   Pt educated on rational for exercises, maintaining strength and ROM for prosthetic use.  Pt ambulated x 30 ft with RW and CGA for requesting to rest. Pt returned to room and to recliner in same manner as above and was left with all needs in reach and alarm active.    Session 2: pt received in bed and agreeable to therapy, RN present having just started IV. Reports 5/10 residual limb pain, premedicated. Rest and positioning provided as needed. Bed mobility mod I, HOB elevated. ambulatory transfer to w/c with RW and CGA. Therapist managing IV line and pole throughout session. W/c mobility throughout session with supervision, including managing w/c parts. Discussed pt's home set up and provided education on backward stair navigation. Pt then navigated up/down 3" step x 3 in this manner with CGA. Pt demoes incr fatigue so transitioned to lower risk activity. Pt  ambulated 2 x ~30 ft with close supervision and w/c follow, no LOB and able to clear foot most steps.Pt then directed in UBE x 12 min using hills profile for incr UE strength and endurance. Pt returned to room and to bed in same manner and was left with all needs in reach and alarm active.   Therapy Documentation Precautions:  Precautions Precautions: Fall, Other (comment) Required Braces or Orthoses: Other Brace Other Brace: limb protector Restrictions Weight Bearing Restrictions: Yes LLE Weight Bearing: Non weight bearing Other Position/Activity Restrictions: L limb protector General:   Vital Signs:   Pain: Pain Assessment Pain Scale: 0-10 Pain Score: 8  Faces Pain Scale: Hurts little more Pain Type: Phantom pain Pain Location: Leg Pain Orientation: Left;Lower Pain Descriptors / Indicators: Stabbing Pain Frequency: Occasional Pain Onset: Gradual Patients Stated Pain Goal: 1 Pain Intervention(s): Medication (See eMAR) Mobility:   Locomotion :    Trunk/Postural Assessment :    Balance:   Exercises:   Other Treatments:      Therapy/Group: Individual Therapy  Juluis Rainier 09/05/2021, 10:33 AM

## 2021-09-05 NOTE — Progress Notes (Signed)
ANCEF held until [facility] Team assess line and dressing

## 2021-09-05 NOTE — Progress Notes (Signed)
ANTICOAGULATION CONSULT NOTE - follow up  Pharmacy Consult for Coumadin and lovenox dosing Indication: VTE prophylaxis s/p BKA in a patient with a mechanical aortic valve replacement (12/05/2019) requiring anticoagulation  Allergies  Allergen Reactions   Pollen Extract Other (See Comments)    Runny nose, watery eyes, sneezing    Patient Measurements: Height: '5\' 10"'  (177.8 cm) Weight: (!) 158.2 kg (348 lb 12.3 oz) IBW/kg (Calculated) : 73   Vital Signs: Temp: 98.2 F (36.8 C) (12/10 0412) Temp Source: Oral (12/10 0412) BP: 128/90 (12/10 0412) Pulse Rate: 79 (12/10 0412)  Labs: Recent Labs    09/03/21 0459 09/03/21 0808 09/04/21 0513 09/05/21 0509  HGB 10.8*  --   --   --   HCT 33.3*  --   --   --   PLT 348  --   --   --   LABPROT  --  19.3* 19.2* 18.0*  INR  --  1.6* 1.6* 1.5*  CREATININE 0.60*  --  0.65  --      Estimated Creatinine Clearance: 176.6 mL/min (by C-G formula based on SCr of 0.65 mg/dL).   Medical History: Past Medical History:  Diagnosis Date   Anxiety    Aortic stenosis    Cellulitis and abscess of lower extremity 06/11/2019   Cellulitis of fourth toe of left foot    Cholelithiasis    Coronary artery disease    Nonobstructive CAD (40-50% LAD) 08/2019   Depression    Depression    Phreesia 09/27/2020   Depression    Phreesia 11/01/2020   Diabetes mellitus without complication ([place])    Phreesia 09/27/2020   Elevated troponin level not due myocardial infarction 11/11/2019   Essential hypertension    Gangrene of toe of left foot ([place]) 07/06/2019   Heart murmur    Phreesia 09/27/2020   Hyperlipidemia    Phreesia 09/27/2020   Hypertension    Phreesia 09/27/2020   Mixed hyperlipidemia    Morbid obesity ([place])    S/P aortic valve replacement with mechanical valve 12/05/2019   25 mm Carbomedics top hat bileaflet mechanical valve via partial upper hemi-sternotomy   Severe aortic stenosis 09/24/2019   Type 2 diabetes mellitus (HCC)      Medications:  Medications Prior to Admission  Medication Sig Dispense Refill Last Dose   atorvastatin (LIPITOR) 80 MG tablet Take 1 tablet (80 mg total) by mouth daily. 90 tablet 3    ceFAZolin (ANCEF) IVPB Inject 2 g into the vein every 8 (eight) hours for 22 days. Indication:  MSSA + GBS bacteremia First Dose: Yes Last Day of Therapy:  09/24/21 Labs - Once weekly:  CBC/D and BMP, Labs - Every other week:  ESR and CRP Method of administration: IV Push Method of administration may be changed at the discretion of home infusion pharmacist based upon assessment of the patient and/or caregiver's ability to self-administer the medication ordered. 22 Units 0    enoxaparin (LOVENOX) 150 MG/ML injection Inject 1 mL (150 mg total) into the skin every 12 (twelve) hours. 0 mL     insulin glargine (LANTUS SOLOSTAR) 100 UNIT/ML Solostar Pen Inject 30 Units into the skin at bedtime. 30 mL 3    lisinopril (ZESTRIL) 10 MG tablet Take 1 tablet (10 mg total) by mouth daily.      metoprolol tartrate (LOPRESSOR) 25 MG tablet Take 1 tablet by mouth twice daily 180 tablet 0    oxyCODONE 10 MG TABS Take 1-1.5 tablets (10-15 mg total) by mouth  every 4 (four) hours as needed for severe pain (pain score 7-10). 30 tablet 0    pantoprazole (PROTONIX) 40 MG tablet Take 1 tablet (40 mg total) by mouth daily.      sertraline (ZOLOFT) 50 MG tablet Take 1 tablet by mouth once daily 90 tablet 1    sitaGLIPtin-metformin (JANUMET) 50-500 MG tablet Take 1 tablet by mouth 2 (two) times daily with a meal. 180 tablet 3    warfarin (COUMADIN) 2.5 MG tablet Take 5 tablets (12.5 mg total) by mouth one time only at 4 PM.       Assessment: Patient with a history of aortic valve replacement on December 05, 2019 ([employer]bomedics Top Hat mechanical in position 1). Patient on PTA warfarin 10 mg daily (last dose 08/25/21) and INR therapeutic on admission. Per chart review, PTA dose has been maintained for at least 6 months.   Patient  underwent left BKA 08/28/21 and warfarin resumed on 08/29/21. INR subtherapeutic at 1.4 on 08/30/21 and Lovenox 150 mg SQ q12h bridge started until INR >/= 2 on warfarin. Anti-Xa level therapeutic at 0.89 on 08/31/21 (goal 0.6-1, Scr 0.65 and stable).  INR has remained subtherapeutic between 1.4 - 1.6 after warfarin dose increases in last 4 days. No bleeding has been reported.   INR today 1.5, subtherapeutic and not at goal of 2-3. Hgb 10.8, HCT 33.3 and PLT 348 and all stable since last check on 09/03/21.   Goal of Therapy:  INR 2-3 Anti-Xa level 0.6-1 units/ml 4hrs after LMWH dose given Monitor platelets by anticoagulation protocol: Yes   Plan:  Coumadin 18 mg po x 1 today Continue Lovenox 150 mg sq q12h until INR is 2.0 or higher (mechanical at the aortic position requires a lower INR goal vs mechanical at the mitral position unless there is a history of VTE in current goal of therapy INR window).  Monitor INR daily CBC every MWF, next due 12/12 Monitor for s/sx of bleeding  Thank you for involving pharmacy in this patient's care.  Elita Quick, PharmD PGY1 Ambulatory Care Pharmacy Resident 09/05/2021 10:08 AM  **Pharmacist phone directory can be found on [email or web address] listed under [place]**

## 2021-09-05 NOTE — Progress Notes (Signed)
Nurse observed blood in dressing, IV team consulted to assess and change

## 2021-09-06 LAB — PROTIME-INR
INR: 1.5 — ABNORMAL HIGH (ref 0.8–1.2)
Prothrombin Time: 18.5 seconds — ABNORMAL HIGH (ref 11.4–15.2)

## 2021-09-06 LAB — GLUCOSE, CAPILLARY
Glucose-Capillary: 130 mg/dL — ABNORMAL HIGH (ref 70–99)
Glucose-Capillary: 112 mg/dL — ABNORMAL HIGH (ref 70–99)
Glucose-Capillary: 161 mg/dL — ABNORMAL HIGH (ref 70–99)
Glucose-Capillary: 142 mg/dL — ABNORMAL HIGH (ref 70–99)

## 2021-09-06 MED ORDER — WARFARIN SODIUM 7.5 MG PO TABS
20.0000 mg | ORAL_TABLET | Freq: Once | ORAL | Status: AC
  Administered 2021-09-06: 20 mg via ORAL
  Filled 2021-09-06: qty 1

## 2021-09-06 NOTE — Progress Notes (Signed)
Occupational Therapy Session Note  Patient Details  Name: Adam Long MRN: [id number] Date of Birth: [birth year]  Today's Date: 09/07/2021 OT Individual Time: 1402-1502 OT Individual Time Calculation (min): 60 min    Short Term Goals: Week 1:  OT Short Term Goal 1 (Week 1): STGs = LTGs  Skilled Therapeutic Interventions/Progress Updates:    Pt greeted in bed, pain manageable for tx and ADL needs presently met. We discussed IADL responsibilities at home including simple meal prep and also pet care (pt has a small dog). Supervision for short distance ambulation to the w/c parked near sink using RW. Note that pt donned his limb guard and sneaker beforehand given setup assistance. While in the kitchen, discussed safe kitchen mobility either using RW or w/c level. Pt practiced locking his w/c brakes in prep for dynamic activity and also sit<stands in front of supportive countertops to retrieve items. Pt practiced retrieving items from fridge, pantry, oven, and lower cabinets. Issued him a walker bag and pt practiced food item transport using it on the walker. Discussed placing meal items within easy reach, purchasing lap tray for w/c + cup holder and/or cup holder for walker for hydration. Pt also practiced removing/applying leg rest and limb rest, able to reach to floor on his Rt side to independently retrieve but unable to on the Lt. To work on [employer] strengthening/endurance and also community mobility, pt navigated on/off elevators and self propelled through the hospital food court given min cues, also maneuvered over uneven concrete outdoors. Pt reports his uncle can assist with caring for small dog, friends/family can assist with IADL related transportation needs. He then self propelled back to the room and returned to bed. Left with all needs within reach, feeling very confident regarding d/c home this week. Tx focus placed heavily on IADL retraining and d/c planning.   Therapy  Documentation Precautions:  Precautions Precautions: Fall, Other (comment) Required Braces or Orthoses: Other Brace Other Brace: limb protector Restrictions Weight Bearing Restrictions: Yes LLE Weight Bearing: Non weight bearing Other Position/Activity Restrictions: L limb protector  Pain: Pain Assessment Pain Score: 1  ADL: ADL Eating: Independent Grooming: Independent Upper Body Bathing: Setup Lower Body Bathing: Minimal assistance Where Assessed-Lower Body Bathing: Edge of bed Upper Body Dressing: Independent Lower Body Dressing: Contact guard Where Assessed-Lower Body Dressing: Edge of bed Toileting: Minimal assistance Toilet Transfer: Minimal assistance   Therapy/Group: Individual Therapy  Adam Long 09/07/2021, 3:48 PM

## 2021-09-06 NOTE — Progress Notes (Signed)
ANTICOAGULATION CONSULT NOTE - follow up  Pharmacy Consult for Coumadin and lovenox dosing Indication: VTE prophylaxis s/p BKA in a patient with a mechanical aortic valve replacement (12/05/2019) requiring anticoagulation  Allergies  Allergen Reactions   Pollen Extract Other (See Comments)    Runny nose, watery eyes, sneezing    Patient Measurements: Height: '5\' 10"'  (177.8 cm) Weight: (!) 158.2 kg (348 lb 12.3 oz) IBW/kg (Calculated) : 73   Vital Signs: Temp: 98.2 F (36.8 C) (12/11 0411) Temp Source: Oral (12/11 0411) BP: 115/69 (12/11 0411) Pulse Rate: 76 (12/11 0411)  Labs: Recent Labs    09/04/21 0513 09/05/21 0509 09/06/21 0340  LABPROT 19.2* 18.0* 18.5*  INR 1.6* 1.5* 1.5*  CREATININE 0.65  --   --      Estimated Creatinine Clearance: 176.6 mL/min (by C-G formula based on SCr of 0.65 mg/dL).   Medical History: Past Medical History:  Diagnosis Date   Anxiety    Aortic stenosis    Cellulitis and abscess of lower extremity 06/11/2019   Cellulitis of fourth toe of left foot    Cholelithiasis    Coronary artery disease    Nonobstructive CAD (40-50% LAD) 08/2019   Depression    Depression    Phreesia 09/27/2020   Depression    Phreesia 11/01/2020   Diabetes mellitus without complication ([place])    Phreesia 09/27/2020   Elevated troponin level not due myocardial infarction 11/11/2019   Essential hypertension    Gangrene of toe of left foot ([place]) 07/06/2019   Heart murmur    Phreesia 09/27/2020   Hyperlipidemia    Phreesia 09/27/2020   Hypertension    Phreesia 09/27/2020   Mixed hyperlipidemia    Morbid obesity ([place])    S/P aortic valve replacement with mechanical valve 12/05/2019   25 mm Carbomedics top hat bileaflet mechanical valve via partial upper hemi-sternotomy   Severe aortic stenosis 09/24/2019   Type 2 diabetes mellitus (HCC)     Medications:  Medications Prior to Admission  Medication Sig Dispense Refill Last Dose   atorvastatin  (LIPITOR) 80 MG tablet Take 1 tablet (80 mg total) by mouth daily. 90 tablet 3    ceFAZolin (ANCEF) IVPB Inject 2 g into the vein every 8 (eight) hours for 22 days. Indication:  MSSA + GBS bacteremia First Dose: Yes Last Day of Therapy:  09/24/21 Labs - Once weekly:  CBC/D and BMP, Labs - Every other week:  ESR and CRP Method of administration: IV Push Method of administration may be changed at the discretion of home infusion pharmacist based upon assessment of the patient and/or caregiver's ability to self-administer the medication ordered. 22 Units 0    enoxaparin (LOVENOX) 150 MG/ML injection Inject 1 mL (150 mg total) into the skin every 12 (twelve) hours. 0 mL     insulin glargine (LANTUS SOLOSTAR) 100 UNIT/ML Solostar Pen Inject 30 Units into the skin at bedtime. 30 mL 3    lisinopril (ZESTRIL) 10 MG tablet Take 1 tablet (10 mg total) by mouth daily.      metoprolol tartrate (LOPRESSOR) 25 MG tablet Take 1 tablet by mouth twice daily 180 tablet 0    oxyCODONE 10 MG TABS Take 1-1.5 tablets (10-15 mg total) by mouth every 4 (four) hours as needed for severe pain (pain score 7-10). 30 tablet 0    pantoprazole (PROTONIX) 40 MG tablet Take 1 tablet (40 mg total) by mouth daily.      sertraline (ZOLOFT) 50 MG tablet Take 1 tablet  by mouth once daily 90 tablet 1    sitaGLIPtin-metformin (JANUMET) 50-500 MG tablet Take 1 tablet by mouth 2 (two) times daily with a meal. 180 tablet 3    warfarin (COUMADIN) 2.5 MG tablet Take 5 tablets (12.5 mg total) by mouth one time only at 4 PM.       Assessment: Patient with a history of aortic valve replacement on December 05, 2019 (Carbomedics Top Hat mechanical in position 1). Patient on PTA warfarin 10 mg daily (last dose 08/25/21) and INR therapeutic on admission. Per chart review, PTA dose has been maintained for at least 6 months.   Patient underwent left BKA 08/28/21 and warfarin resumed on 08/29/21. INR subtherapeutic at 1.4 on 08/30/21 and Lovenox 150 mg SQ  q12h bridge started until INR >/= 2 on warfarin. Anti-Xa level therapeutic at 0.89 on 08/31/21 (goal 0.6-1, Scr 0.65 and stable).  INR has remained subtherapeutic between 1.4 - 1.6 after warfarin dose increases in last 5 days. No bleeding has been reported.   INR today 1.5, and continues to be subtherapeutic and not at goal of 2-3. Hgb 10.8, HCT 33.3 and PLT 348 and all stable since last check on 09/03/21.   Discussed with RN and RN reports that patient is taking medication as charted. RN states that patient has been eating diet provided from hospital. Discussed with patient and he reports that he has had about 40% increase in vegetable intake compared to his regular diet prior to admission. I encouraged patient to continue eating vegetables and that we will dose warfarin around this. Of note, patient is also receiving 1 carton Ensure Max BID--per review of nutrition facts, each carton contains 25% of total daily value of Vitamin K. Diet changes are likely contributing to continuous subtherapeutic INRs. Patient will need close follow-up at [facility] once discharged to account for any further diet changes.   Goal of Therapy:  INR 2-3 Anti-Xa level 0.6-1 units/ml 4hrs after LMWH dose given Monitor platelets by anticoagulation protocol: Yes   Plan:  Coumadin 20 mg po x 1 today Continue Lovenox 150 mg sq q12h until INR is 2.0 or higher (mechanical at the aortic position requires a lower INR goal vs mechanical at the mitral position unless there is a history of VTE in current goal of therapy INR window).  Monitor INR daily CBC every MWF, next due 12/12 Monitor for s/sx of bleeding  Thank you for involving pharmacy in this patient's care.  Elita Quick, PharmD PGY1 Ambulatory Care Pharmacy Resident 09/06/2021 10:02 AM  **Pharmacist phone directory can be found on [email or web address] listed under [place]**

## 2021-09-07 DIAGNOSIS — S88112A Complete traumatic amputation at level between knee and ankle, left lower leg, initial encounter: Secondary | ICD-10-CM | POA: Diagnosis not present

## 2021-09-07 LAB — CBC WITH DIFFERENTIAL/PLATELET
Abs Immature Granulocytes: 0.1 10*3/uL — ABNORMAL HIGH (ref 0.00–0.07)
Basophils Absolute: 0 10*3/uL (ref 0.0–0.1)
Basophils Relative: 0 %
Eosinophils Absolute: 0.1 10*3/uL (ref 0.0–0.5)
Eosinophils Relative: 1 %
HCT: 32.9 % — ABNORMAL LOW (ref 39.0–52.0)
Hemoglobin: 10.3 g/dL — ABNORMAL LOW (ref 13.0–17.0)
Immature Granulocytes: 1 %
Lymphocytes Relative: 21 %
Lymphs Abs: 2 10*3/uL (ref 0.7–4.0)
MCH: 26.9 pg (ref 26.0–34.0)
MCHC: 31.3 g/dL (ref 30.0–36.0)
MCV: 85.9 fL (ref 80.0–100.0)
Monocytes Absolute: 0.7 10*3/uL (ref 0.1–1.0)
Monocytes Relative: 7 %
Neutro Abs: 6.8 10*3/uL (ref 1.7–7.7)
Neutrophils Relative %: 70 %
Platelets: 336 10*3/uL (ref 150–400)
RBC: 3.83 MIL/uL — ABNORMAL LOW (ref 4.22–5.81)
RDW: 15.4 % (ref 11.5–15.5)
WBC: 9.9 10*3/uL (ref 4.0–10.5)
nRBC: 0 % (ref 0.0–0.2)

## 2021-09-07 LAB — SEDIMENTATION RATE: Sed Rate: 65 mm/hr — ABNORMAL HIGH (ref 0–16)

## 2021-09-07 LAB — PROTIME-INR
INR: 1.6 — ABNORMAL HIGH (ref 0.8–1.2)
Prothrombin Time: 19.4 seconds — ABNORMAL HIGH (ref 11.4–15.2)

## 2021-09-07 LAB — BASIC METABOLIC PANEL
Anion gap: 6 (ref 5–15)
BUN: 13 mg/dL (ref 6–20)
CO2: 27 mmol/L (ref 22–32)
Calcium: 8.5 mg/dL — ABNORMAL LOW (ref 8.9–10.3)
Chloride: 99 mmol/L (ref 98–111)
Creatinine, Ser: 0.58 mg/dL — ABNORMAL LOW (ref 0.61–1.24)
GFR, Estimated: >60 mL/min (ref >60)
Glucose, Bld: 132 mg/dL — ABNORMAL HIGH (ref 70–99)
Potassium: 4.2 mmol/L (ref 3.5–5.1)
Sodium: 132 mmol/L — ABNORMAL LOW (ref 135–145)

## 2021-09-07 LAB — C-REACTIVE PROTEIN: CRP: 3.4 mg/dL — ABNORMAL HIGH (ref <1.0)

## 2021-09-07 LAB — GLUCOSE, CAPILLARY
Glucose-Capillary: 138 mg/dL — ABNORMAL HIGH (ref 70–99)
Glucose-Capillary: 152 mg/dL — ABNORMAL HIGH (ref 70–99)
Glucose-Capillary: 130 mg/dL — ABNORMAL HIGH (ref 70–99)
Glucose-Capillary: 145 mg/dL — ABNORMAL HIGH (ref 70–99)

## 2021-09-07 MED ORDER — GABAPENTIN 300 MG PO CAPS
300.0000 mg | ORAL_CAPSULE | Freq: Two times a day (BID) | ORAL | Status: DC
  Administered 2021-09-07 – 2021-09-08 (×4): 300 mg via ORAL
  Filled 2021-09-07 (×5): qty 1

## 2021-09-07 MED ORDER — WARFARIN SODIUM 7.5 MG PO TABS
25.0000 mg | ORAL_TABLET | Freq: Once | ORAL | Status: AC
  Administered 2021-09-07: 25 mg via ORAL
  Filled 2021-09-07: qty 2

## 2021-09-07 NOTE — Progress Notes (Signed)
PROGRESS NOTE   Subjective/Complaints:  Pt reports LBM 2 days ago- Needs to go again today.  Pt also reports a lot of phantom pain- L FOOT hurting- a lot of his pain is phantom pain.   Also said his L BKA looks good as of yesterday.  Asked him to have unwrapped in Am to look at it.   ROS:  Pt denies SOB, abd pain, CP, N/V/C/D, and vision changes   Objective:   No results found. Recent Labs    09/07/21 0330  WBC 9.9  HGB 10.3*  HCT 32.9*  PLT 336   Recent Labs    09/07/21 0330  NA 132*  K 4.2  CL 99  CO2 27  GLUCOSE 132*  BUN 13  CREATININE 0.58*  CALCIUM 8.5*    Intake/Output Summary (Last 24 hours) at 09/07/2021 1246 Last data filed at 09/07/2021 4496 Gross per 24 hour  Intake 720 ml  Output 1850 ml  Net -1130 ml        Physical Exam: Vital Signs Blood pressure 125/69, pulse 71, temperature 98.1 F (36.7 C), temperature source Oral, resp. rate 18, height 5\' 10"  (1.778 m), weight (!) 158.2 kg, SpO2 99 %.      General: awake, alert, appropriate, sitting up in bed; NAD; BMI 50 HENT: conjugate gaze; oropharynx moist CV: regular rate; no JVD Pulmonary: CTA B/L; no W/R/R- good air movement GI: soft, NT, ND, (+)BS; hypoactive Psychiatric: appropriate Neurological: Alert   Skin: L BKA- dressed C/D/I- with ACE wrap in place-no Shrinker seen- kerlex wrapping leg underneath  Assessment/Plan: 1. Functional deficits which require 3+ hours per day of interdisciplinary therapy in a comprehensive inpatient rehab setting. Physiatrist is providing close team supervision and 24 hour management of active medical problems listed below. Physiatrist and rehab team continue to assess barriers to discharge/monitor patient progress toward functional and medical goals  Care Tool:  Bathing    Body parts bathed by patient: Right arm, Left arm, Chest, Abdomen, Front perineal area, Right upper leg, Left upper  leg, Face, Right lower leg, Buttocks   Body parts bathed by helper: Buttocks Body parts n/a: Left lower leg   Bathing assist Assist Level: Supervision/Verbal cueing     Upper Body Dressing/Undressing Upper body dressing   What is the patient wearing?: Pull over shirt    Upper body assist Assist Level: Set up assist    Lower Body Dressing/Undressing Lower body dressing      What is the patient wearing?: Underwear/pull up, Pants     Lower body assist Assist for lower body dressing: Supervision/Verbal cueing     Toileting Toileting    Toileting assist Assist for toileting: Supervision/Verbal cueing Assistive Device Comment: Rolling walker   Transfers Chair/bed transfer  Transfers assist     Chair/bed transfer assist level: Contact Guard/Touching assist Chair/bed transfer assistive device:   Ambulation assist      Assist level: Contact Guard/Touching assist Assistive device: Walker-rolling Max distance: 30 ft   Walk 10 feet activity   Assist     Assist level: Contact Guard/Touching assist Assistive device: Walker-rolling   Walk 50 feet activity   Assist  Walk 50 feet with 2 turns activity did not occur: Safety/medical concerns         Walk 150 feet activity   Assist Walk 150 feet activity did not occur: Safety/medical concerns         Walk 10 feet on uneven surface  activity   Assist Walk 10 feet on uneven surfaces activity did not occur: Safety/medical concerns         Wheelchair     Assist Is the patient using a wheelchair?: Yes Type of Wheelchair: Manual    Wheelchair assist level: Supervision/Verbal cueing Max wheelchair distance: 80 ft    Wheelchair 50 feet with 2 turns activity    Assist        Assist Level: Supervision/Verbal cueing   Wheelchair 150 feet activity     Assist      Assist Level: Minimal Assistance - Patient > 75%   Blood pressure 125/69, pulse 71,  temperature 98.1 F (36.7 C), temperature source Oral, resp. rate 18, height 5\' 10"  (1.778 m), weight (!) 158.2 kg, SpO2 99 %.  Medical Problem List and Plan: 1. Functional deficits secondary to L BKA             -patient may shower but incision must be covered             -ELOS/Goals: 10-14 days S  12/12- con't CIR- PT and OT_ asked pt to have leg unwrapped tomorrow to assess 2.  Mechanical Aortic valve/Antithrombotics: -DVT/anticoagulation:  Pharmaceutical: Coumadin with lovenox bridge till INR>2.5 12/12- INR 1.6 still- per pharmacy             -antiplatelet therapy: N/A 3. Residual limb pain: continue Oxycodone prn.  4. Mood: LCSW to follow for evaluation and support.              -antipsychotic agents: N/A 5. Neuropsych: This patient is capable of making decisions on his own behalf. 6. Skin/Wound Care: Routine pressure relief measures.              --wound VAC come off POD# 7 on 12/09  12/8- MASD in groin- WOC started silver antimicrobial wicking fabric  12/9- wound VAC is off- con't dressings- will order shrinkers from [employer] with prosthetic education- with his BMI of 50, will have a harder time with BKA/prosthesis.  7. Fluids/Electrolytes/Nutrition: Monitor I/O. Check CMET in am.  8. Sepsis secondary to MSSA bacteremia: continue IV cefazolin with end date 09/24/21.             --weekly labs 9. T2DM: Uncontrolled with A1C-7.8. Monitor BS ac/hs and use SSI for elevated BS             --start metformin 250mg  daily             --continue insulin glargine 15 units (was on 30 units PTA) with SSI for elevated BS.    12/12- CBGs controlled- con't regimen 10. HTN: Monitor BP TID--has been running low             --Lisinopril 10 mg with metoprolol bid. Monitor for orthostatic changes.   12/10- BP well controlled- con't regimen 11. Super morbid obesity: BMI 54.5. will consult dietician to educate on diet --Educate on weight loss and dietary changes to help promote overall health and  mobility.   12/9- BMI down to 50- con't education 12/12- BMI stable- con't decrease in intake 12. Hyponatremia: Recheck lytes in am. 12/8- Na 128- will recheck in AM- if still <130, will put  on fluid restriction  12/9- Na back up to 133- will recheck weekly and prn.  13. Malnutrition: Albumin has dropped from 3.2-->2.7 today.             --add protein supplement TID. 14. Leucocytosis:Has been fluctuating 15.3-->12.3-->14.3  12/8- WBC down to 10.7- so doing much better- con't to monitor  12/12- WBC down to 9.9- con't to monitor --Monitor for fevers and trends.     LOS: 5 days A FACE TO FACE EVALUATION WAS PERFORMED  Ravynn Hogate 09/07/2021, 12:46 PM

## 2021-09-07 NOTE — Progress Notes (Signed)
Patient resting in bed. Requested PRN pain medication OXY 15 MG. Treatment completed to left stump as ordered. No S/S of distress. Insulin given as ordered. Call light within reach, personal items , safety maintained.

## 2021-09-07 NOTE — Progress Notes (Signed)
Occupational Therapy Session Note  Patient Details  Name: Adam Long MRN: [id number] Date of Birth: [birth year]  Today's Date: 09/07/2021 OT Individual Time: 1146-4314 OT Individual Time Calculation (min): 56 min    Short Term Goals: Week 1:  OT Short Term Goal 1 (Week 1): STGs = LTGs   Skilled Therapeutic Interventions/Progress Updates:    Pt greeted at time of session sitting up in recliner agreeable to OT session, no complaints of pain. Pt wanting to perform ADL routine this AM, initially attempting to remove limb guard prior to transfer but educated that this needs to remain on during standing and transfer tasks. Needing to toilet, ambulating/hopping recliner > bathroom > back out to wheelchair all with Supervision/CGA with bariwalker. Commode transfer and clothing management same manner. Pt did not need to have BM, gas only. Up in wheelchair, self propel to sink for UB/LB bathing with CGA in standing when washing buttocks. Discussion throughout ADL regarding home set up, adaptations for home bathroom at pedestal sink, having a chair to sit on for bathing, door widths, etc. Pt states he belies RW will fit in bathroom and then can sit to perform ADL tasks. UB dress set up, LB dress CGA/supervision at sit > stand at sink for support. Provided LHS for future bathing tasks and demonstrated use. Stand pivot wheelchair > bed Supervision. Alarm on call bell in reach.    Therapy Documentation Precautions:  Precautions Precautions: Fall, Other (comment) Required Braces or Orthoses: Other Brace Other Brace: limb protector Restrictions Weight Bearing Restrictions: Yes LLE Weight Bearing: Non weight bearing Other Position/Activity Restrictions: L limb protector     Therapy/Group: Individual Therapy  [name] 09/07/2021, 7:20 AM

## 2021-09-07 NOTE — Progress Notes (Signed)
Physical Therapy Session Note  Patient Details  Name: Adam Long MRN: [id number] Date of Birth: Oct 02, 1975  Today's Date: 09/07/2021 PT Individual Time: 0800-0915 PT Individual Time Calculation (min): 75 min   Short Term Goals: Week 1:  PT Short Term Goal 1 (Week 1): =LTGs d/t ELOS  Skilled Therapeutic Interventions/Progress Updates:    pt received in bed and agreeable to therapy. Pt reported pain largely controlled with pain medication. Supine>sit with supervision from flat bed. Pt donned limb guard and R shoe with set up. Sit to stand and ambulatory transfer to w/c with CGA and RW. Pt propelled w/c with BUE throughout session with supervision, including managing w/c parts. Pt instructed on squat pivot transfer to mat table and performed with CGA. Pt educated on and performed mirror therapy EOM and in long sitting. Later in session, pt reported phantom pain which eased with using mirror and ankle ROM. Pt performed the following exercises on mat table for ROM and strength: prone hip extension, sidelying abduction, prone knee flexion. Pt had difficulty with quad sets to directed in SAQ for improved quad activation, progressed to SLR with quad set.  Pt navigated 3" stairs x 2 forward and 1 x 2 with backwards navigation and VC. Pt with difficulty/anxiety with backward navigation but improved safety compared to forward navigation. Pt returned to room and performed Stand pivot transfer with supervision, including set up for transfer. Pt remained in recliner and was left with all needs in reach and alarm active.    Therapy Documentation Precautions:  Precautions Precautions: Fall, Other (comment) Required Braces or Orthoses: Other Brace Other Brace: limb protector Restrictions Weight Bearing Restrictions: Yes LLE Weight Bearing: Non weight bearing Other Position/Activity Restrictions: L limb protector   Therapy/Group: Individual Therapy  Juluis Rainier 09/07/2021, 12:53 PM

## 2021-09-07 NOTE — Progress Notes (Signed)
ANTICOAGULATION CONSULT NOTE - follow up  Pharmacy Consult for Coumadin and lovenox dosing Indication: VTE prophylaxis s/p BKA in a patient with a mechanical aortic valve replacement (12/05/2019) requiring anticoagulation  Allergies  Allergen Reactions   Pollen Extract Other (See Comments)    Runny nose, watery eyes, sneezing    Patient Measurements: Height: '5\' 10"'  (177.8 cm) Weight: (!) 158.2 kg (348 lb 12.3 oz) IBW/kg (Calculated) : 73   Vital Signs: Temp: 98.1 F (36.7 C) (12/12 0431) Temp Source: Oral (12/12 0431) BP: 125/69 (12/12 0431) Pulse Rate: 71 (12/12 0431)  Labs: Recent Labs    09/05/21 0509 09/06/21 0340 09/07/21 0330  HGB  --   --  10.3*  HCT  --   --  32.9*  PLT  --   --  336  LABPROT 18.0* 18.5* 19.4*  INR 1.5* 1.5* 1.6*  CREATININE  --   --  0.58*     Estimated Creatinine Clearance: 176.6 mL/min (A) (by C-G formula based on SCr of 0.58 mg/dL (L)).   Medical History: Past Medical History:  Diagnosis Date   Anxiety    Aortic stenosis    Cellulitis and abscess of lower extremity 06/11/2019   Cellulitis of fourth toe of left foot    Cholelithiasis    Coronary artery disease    Nonobstructive CAD (40-50% LAD) 08/2019   Depression    Depression    Phreesia 09/27/2020   Depression    Phreesia 11/01/2020   Diabetes mellitus without complication ([place])    Phreesia 09/27/2020   Elevated troponin level not due myocardial infarction 11/11/2019   Essential hypertension    Gangrene of toe of left foot ([place]) 07/06/2019   Heart murmur    Phreesia 09/27/2020   Hyperlipidemia    Phreesia 09/27/2020   Hypertension    Phreesia 09/27/2020   Mixed hyperlipidemia    Morbid obesity ([place])    S/P aortic valve replacement with mechanical valve 12/05/2019   25 mm Carbomedics top hat bileaflet mechanical valve via partial upper hemi-sternotomy   Severe aortic stenosis 09/24/2019   Type 2 diabetes mellitus (HCC)     Medications:  Medications Prior to  Admission  Medication Sig Dispense Refill Last Dose   atorvastatin (LIPITOR) 80 MG tablet Take 1 tablet (80 mg total) by mouth daily. 90 tablet 3    ceFAZolin (ANCEF) IVPB Inject 2 g into the vein every 8 (eight) hours for 22 days. Indication:  MSSA + GBS bacteremia First Dose: Yes Last Day of Therapy:  09/24/21 Labs - Once weekly:  CBC/D and BMP, Labs - Every other week:  ESR and CRP Method of administration: IV Push Method of administration may be changed at the discretion of home infusion pharmacist based upon assessment of the patient and/or caregiver's ability to self-administer the medication ordered. 22 Units 0    enoxaparin (LOVENOX) 150 MG/ML injection Inject 1 mL (150 mg total) into the skin every 12 (twelve) hours. 0 mL     insulin glargine (LANTUS SOLOSTAR) 100 UNIT/ML Solostar Pen Inject 30 Units into the skin at bedtime. 30 mL 3    lisinopril (ZESTRIL) 10 MG tablet Take 1 tablet (10 mg total) by mouth daily.      metoprolol tartrate (LOPRESSOR) 25 MG tablet Take 1 tablet by mouth twice daily 180 tablet 0    oxyCODONE 10 MG TABS Take 1-1.5 tablets (10-15 mg total) by mouth every 4 (four) hours as needed for severe pain (pain score 7-10). 30 tablet 0  pantoprazole (PROTONIX) 40 MG tablet Take 1 tablet (40 mg total) by mouth daily.      sertraline (ZOLOFT) 50 MG tablet Take 1 tablet by mouth once daily 90 tablet 1    sitaGLIPtin-metformin (JANUMET) 50-500 MG tablet Take 1 tablet by mouth 2 (two) times daily with a meal. 180 tablet 3    warfarin (COUMADIN) 2.5 MG tablet Take 5 tablets (12.5 mg total) by mouth one time only at 4 PM.       Assessment: Patient with a history of aortic valve replacement on December 05, 2019 (Carbomedics Top Hat mechanical in position 1). Patient on PTA warfarin 10 mg daily (last dose 08/25/21) and INR therapeutic on admission. Per chart review, PTA dose has been maintained for at least 6 months.   Patient underwent left BKA 08/28/21 and warfarin resumed  on 08/29/21. INR subtherapeutic at 1.4 on 08/30/21 and Lovenox 150 mg SQ q12h bridge started until INR >/= 2 on warfarin. Anti-Xa level therapeutic at 0.89 on 08/31/21 (goal 0.6-1, Scr 0.65 and stable).  INR has remained subtherapeutic between 1.4 - 1.6 after warfarin dose increases in last 5 days. No bleeding has been reported.   INR up slightly 1.6, and continues to be subtherapeutic and not at goal of 2-3. We will give a higher dose today.    Discussed with RN and RN reports that patient is taking medication as charted. RN states that patient has been eating diet provided from hospital. Discussed with patient and he reports that he has had about 40% increase in vegetable intake compared to his regular diet prior to admission. I encouraged patient to continue eating vegetables and that we will dose warfarin around this. Of note, patient is also receiving 1 carton Ensure Max BID--per review of nutrition facts, each carton contains 25% of total daily value of Vitamin K. Diet changes are likely contributing to continuous subtherapeutic INRs. Patient will need close follow-up at [facility] once discharged to account for any further diet changes.   Goal of Therapy:  INR 2-3 Anti-Xa level 0.6-1 units/ml 4hrs after LMWH dose given Monitor platelets by anticoagulation protocol: Yes   Plan:  Coumadin 25 mg po x 1 today Continue Lovenox 150 mg sq q12h until INR is 2.0 or higher (mechanical at the aortic position requires a lower INR goal vs mechanical at the mitral position unless there is a history of VTE in current goal of therapy INR window).  Monitor INR daily CBC every MWF, next due 12/12 Monitor for s/sx of bleeding  Onnie Boer, PharmD, BCIDP, AAHIVP, CPP Infectious Disease Pharmacist 09/07/2021 8:37 AM

## 2021-09-08 LAB — GLUCOSE, CAPILLARY
Glucose-Capillary: 154 mg/dL — ABNORMAL HIGH (ref 70–99)
Glucose-Capillary: 122 mg/dL — ABNORMAL HIGH (ref 70–99)
Glucose-Capillary: 107 mg/dL — ABNORMAL HIGH (ref 70–99)
Glucose-Capillary: 178 mg/dL — ABNORMAL HIGH (ref 70–99)

## 2021-09-08 LAB — PROTIME-INR
INR: 1.8 — ABNORMAL HIGH (ref 0.8–1.2)
Prothrombin Time: 20.6 seconds — ABNORMAL HIGH (ref 11.4–15.2)

## 2021-09-08 MED ORDER — WARFARIN SODIUM 7.5 MG PO TABS
25.0000 mg | ORAL_TABLET | Freq: Once | ORAL | Status: AC
  Administered 2021-09-08: 25 mg via ORAL
  Filled 2021-09-08: qty 2

## 2021-09-08 NOTE — Discharge Instructions (Addendum)
°  Inpatient Rehab Discharge Instructions  Adam Long Discharge date and time: 09/14/21   Activities/Precautions/ Functional Status: Activity: no lifting, driving, or strenuous exercise for till cleared by MD Diet: cardiac diet and diabetic diet Wound Care: Wash with dial soap and water. Keep wound clean and dry. Contact Dr. Lajoyce Corners if you develop any problems with your incision/wound--redness, swelling, increase in pain, drainage or if you develop fever or chills.     Functional status:  ___ No restrictions     ___ Walk up steps independently ___ 24/7 supervision/assistance   ___ Walk up steps with assistance _X__ Intermittent supervision/assistance  _X__ Bathe/dress independently _X__ Walk with walker     ___ Bathe/dress with assistance ___ Walk Independently    ___ Shower independently ___ Walk with assistance    _X__ Shower with assistance _X__ No alcohol     ___ Return to work/school ________   Special Instructions: Monitor blood sugars before meals and at bedtime. Note that you  medications have been decreased and blood sugars may start running up---contact PCP for input prior to resuming any medications.  3.  Note increase in coumadin dose--you are set to have coumadin levels rechecked on Wednesday. Do not cancel this appointment.   REFERRALS UPON DISCHARGE:    Home Health:   RN                Agency:[facility] Phone:  [phone]  WILL NEED OUTPATIENT PHYSICAL THERAPY ONCE READY FOR HIS PROTHESIS-PT AWARE   Medical Equipment/Items Ordered: WHEELCHAIR AND BARIATRIC DROP-ARM BEDSIDE COMMODE-PATIENT TO GET ROLLING WALKER ON HIS OWN                                                 Agency/Supplier:[facility]  [phone]     My questions have been answered and I understand these instructions. I will adhere to these goals and the provided educational materials after my discharge from the hospital.  Patient/Caregiver Signature _______________________________  Date __________  Clinician Signature _______________________________________ Date __________  Please bring this form and your medication list with you to all your follow-up doctor's appointments.  COMMUNITY

## 2021-09-08 NOTE — Progress Notes (Signed)
ANTICOAGULATION CONSULT NOTE - follow up  Pharmacy Consult for Coumadin and lovenox dosing Indication: VTE prophylaxis s/p BKA in a patient with a mechanical aortic valve replacement (12/05/2019) requiring anticoagulation  Allergies  Allergen Reactions   Pollen Extract Other (See Comments)    Runny nose, watery eyes, sneezing    Patient Measurements: Height: '5\' 10"'  (177.8 cm) Weight: (!) 158.2 kg (348 lb 12.3 oz) IBW/kg (Calculated) : 73   Vital Signs: Temp: 98.2 F (36.8 C) (12/13 0543) Temp Source: Oral (12/13 0543) BP: 113/67 (12/13 0543) Pulse Rate: 80 (12/13 0543)  Labs: Recent Labs    09/06/21 0340 09/07/21 0330 09/08/21 0615  HGB  --  10.3*  --   HCT  --  32.9*  --   PLT  --  336  --   LABPROT 18.5* 19.4* 20.6*  INR 1.5* 1.6* 1.8*  CREATININE  --  0.58*  --      Estimated Creatinine Clearance: 176.6 mL/min (A) (by C-G formula based on SCr of 0.58 mg/dL (L)).   Medical History: Past Medical History:  Diagnosis Date   Anxiety    Aortic stenosis    Cellulitis and abscess of lower extremity 06/11/2019   Cellulitis of fourth toe of left foot    Cholelithiasis    Coronary artery disease    Nonobstructive CAD (40-50% LAD) 08/2019   Depression    Depression    Phreesia 09/27/2020   Depression    Phreesia 11/01/2020   Diabetes mellitus without complication ([place])    Phreesia 09/27/2020   Elevated troponin level not due myocardial infarction 11/11/2019   Essential hypertension    Gangrene of toe of left foot ([place]) 07/06/2019   Heart murmur    Phreesia 09/27/2020   Hyperlipidemia    Phreesia 09/27/2020   Hypertension    Phreesia 09/27/2020   Mixed hyperlipidemia    Morbid obesity ([place])    S/P aortic valve replacement with mechanical valve 12/05/2019   25 mm Carbomedics top hat bileaflet mechanical valve via partial upper hemi-sternotomy   Severe aortic stenosis 09/24/2019   Type 2 diabetes mellitus (HCC)     Medications:  Medications Prior to  Admission  Medication Sig Dispense Refill Last Dose   atorvastatin (LIPITOR) 80 MG tablet Take 1 tablet (80 mg total) by mouth daily. 90 tablet 3    ceFAZolin (ANCEF) IVPB Inject 2 g into the vein every 8 (eight) hours for 22 days. Indication:  MSSA + GBS bacteremia First Dose: Yes Last Day of Therapy:  09/24/21 Labs - Once weekly:  CBC/D and BMP, Labs - Every other week:  ESR and CRP Method of administration: IV Push Method of administration may be changed at the discretion of home infusion pharmacist based upon assessment of the patient and/or caregiver's ability to self-administer the medication ordered. 22 Units 0    enoxaparin (LOVENOX) 150 MG/ML injection Inject 1 mL (150 mg total) into the skin every 12 (twelve) hours. 0 mL     insulin glargine (LANTUS SOLOSTAR) 100 UNIT/ML Solostar Pen Inject 30 Units into the skin at bedtime. 30 mL 3    lisinopril (ZESTRIL) 10 MG tablet Take 1 tablet (10 mg total) by mouth daily.      metoprolol tartrate (LOPRESSOR) 25 MG tablet Take 1 tablet by mouth twice daily 180 tablet 0    oxyCODONE 10 MG TABS Take 1-1.5 tablets (10-15 mg total) by mouth every 4 (four) hours as needed for severe pain (pain score 7-10). 30 tablet 0  pantoprazole (PROTONIX) 40 MG tablet Take 1 tablet (40 mg total) by mouth daily.      sertraline (ZOLOFT) 50 MG tablet Take 1 tablet by mouth once daily 90 tablet 1    sitaGLIPtin-metformin (JANUMET) 50-500 MG tablet Take 1 tablet by mouth 2 (two) times daily with a meal. 180 tablet 3    warfarin (COUMADIN) 2.5 MG tablet Take 5 tablets (12.5 mg total) by mouth one time only at 4 PM.       Assessment: Patient with a history of aortic valve replacement on December 05, 2019 (Carbomedics Top Hat mechanical in position 1). Patient on PTA warfarin 10 mg daily (last dose 08/25/21) and INR therapeutic on admission. Per chart review, PTA dose has been maintained for at least 6 months.   Patient underwent left BKA 08/28/21 and warfarin resumed  on 08/29/21. INR subtherapeutic at 1.4 on 08/30/21 and Lovenox 150 mg SQ q12h bridge started until INR >/= 2 on warfarin. Anti-Xa level therapeutic at 0.89 on 08/31/21 (goal 0.6-1, Scr 0.65 and stable).  INR has remained subtherapeutic between 1.4 - 1.6 after warfarin dose increases in last 5 days. No bleeding has been reported.   INR up slightly 1.8. It's puzzling with current dose vs baseline home regiment. We will repeat a higher dose today.    Discussed with RN and RN reports that patient is taking medication as charted. RN states that patient has been eating diet provided from hospital. Discussed with patient and he reports that he has had about 40% increase in vegetable intake compared to his regular diet prior to admission. I encouraged patient to continue eating vegetables and that we will dose warfarin around this. Of note, patient is also receiving 1 carton Ensure Max BID--per review of nutrition facts, each carton contains 25% of total daily value of Vitamin K. Diet changes are likely contributing to continuous subtherapeutic INRs. Patient will need close follow-up at [facility] once discharged to account for any further diet changes.   Goal of Therapy:  INR 2-3 Anti-Xa level 0.6-1 units/ml 4hrs after LMWH dose given Monitor platelets by anticoagulation protocol: Yes   Plan:  Coumadin 25 mg po x 1 today Continue Lovenox 150 mg sq q12h until INR is 2.0 or higher (mechanical at the aortic position requires a lower INR goal vs mechanical at the mitral position unless there is a history of VTE in current goal of therapy INR window).  Monitor INR daily CBC every MWF, next due 12/12 Monitor for s/sx of bleeding  Onnie Boer, PharmD, BCIDP, AAHIVP, CPP Infectious Disease Pharmacist 09/08/2021 8:46 AM

## 2021-09-08 NOTE — Progress Notes (Addendum)
Patient ID: Adam Long, male   DOB: 15-Nov-1975, 45 y.o.   MRN: [id number]  met with pt to discuss team conference goals of mod/I and discharge date of 12/19. He felt this is too long and wants to go home sooner. Discussed the difficulty with trying to get [facility] for his IV antibiotics due to health insurance ending 12/31 and not paying for services. He has not thought about getting more insurance once ended due to not working now. Will ask if antibiotics can be changed or shortened, now suppose to end 12/29. Discussed insurance will cover one assistive device he wants the wheelchair covered and will get the walker on his own. Will ask if antibiotic can be shortened or made po. Pt not happy about discharge date, aware will get no follow up PT at discharge.     2;35 PM [facility] referral. So have IV antibiotics covered until 12/29

## 2021-09-08 NOTE — Progress Notes (Signed)
PROGRESS NOTE   Subjective/Complaints:  Pt reports needs to have a BM this AM- waiting for nursing to help to bathroom; Gabapentin made a big difference already for phantom pain.   Feeling much better, pain wise.  ROS:  Pt denies SOB, abd pain, CP, N/V/C/D, and vision changes   Objective:   No results found. Recent Labs    09/07/21 0330  WBC 9.9  HGB 10.3*  HCT 32.9*  PLT 336   Recent Labs    09/07/21 0330  NA 132*  K 4.2  CL 99  CO2 27  GLUCOSE 132*  BUN 13  CREATININE 0.58*  CALCIUM 8.5*    Intake/Output Summary (Last 24 hours) at 09/08/2021 0829 Last data filed at 09/08/2021 0700 Gross per 24 hour  Intake 720 ml  Output 2850 ml  Net -2130 ml        Physical Exam: Vital Signs Blood pressure 113/67, pulse 80, temperature 98.2 F (36.8 C), temperature source Oral, resp. rate 14, height '5\' 10"'  (1.778 m), weight (!) 158.2 kg, SpO2 98 %.       General: awake, alert, appropriate, sitting up in bed; 100% tray; NAD HENT: conjugate gaze; oropharynx moist CV: regular rate; no JVD Pulmonary: CTA B/L; no W/R/R- good air movement GI: soft, NT, ND, (+)BS Psychiatric: appropriate; more interactive Neuro: Ox3 Skin: L BKA- retention sutures AND staples in place- trace pink/erythema localized to sutures/staples- no drainage- moderate edema/swelling- no dog ears anymore.   Assessment/Plan: 1. Functional deficits which require 3+ hours per day of interdisciplinary therapy in a comprehensive inpatient rehab setting. Physiatrist is providing close team supervision and 24 hour management of active medical problems listed below. Physiatrist and rehab team continue to assess barriers to discharge/monitor patient progress toward functional and medical goals  Care Tool:  Bathing    Body parts bathed by patient: Right arm, Left arm, Chest, Abdomen, Front perineal area, Right upper leg, Left upper leg, Face,  Right lower leg, Buttocks   Body parts bathed by helper: Buttocks Body parts n/a: Left lower leg   Bathing assist Assist Level: Supervision/Verbal cueing     Upper Body Dressing/Undressing Upper body dressing   What is the patient wearing?: Pull over shirt    Upper body assist Assist Level: Set up assist    Lower Body Dressing/Undressing Lower body dressing      What is the patient wearing?: Underwear/pull up, Pants     Lower body assist Assist for lower body dressing: Supervision/Verbal cueing     Toileting Toileting    Toileting assist Assist for toileting: Supervision/Verbal cueing Assistive Device Comment: Rolling walker   Transfers Chair/bed transfer  Transfers assist     Chair/bed transfer assist level: Contact Guard/Touching assist Chair/bed transfer assistive device: Programmer, multimedia   Ambulation assist      Assist level: Contact Guard/Touching assist Assistive device: Walker-rolling Max distance: 30 ft   Walk 10 feet activity   Assist     Assist level: Contact Guard/Touching assist Assistive device: Walker-rolling   Walk 50 feet activity   Assist Walk 50 feet with 2 turns activity did not occur: Safety/medical concerns  Walk 150 feet activity   Assist Walk 150 feet activity did not occur: Safety/medical concerns         Walk 10 feet on uneven surface  activity   Assist Walk 10 feet on uneven surfaces activity did not occur: Safety/medical concerns         Wheelchair     Assist Is the patient using a wheelchair?: Yes Type of Wheelchair: Manual    Wheelchair assist level: Supervision/Verbal cueing Max wheelchair distance: 80 ft    Wheelchair 50 feet with 2 turns activity    Assist        Assist Level: Supervision/Verbal cueing   Wheelchair 150 feet activity     Assist      Assist Level: Minimal Assistance - Patient > 75%   Blood pressure 113/67, pulse 80, temperature  98.2 F (36.8 C), temperature source Oral, resp. rate 14, height '5\' 10"'  (1.778 m), weight (!) 158.2 kg, SpO2 98 %.  Medical Problem List and Plan: 1. Functional deficits secondary to L BKA             -patient may shower but incision must be covered             -ELOS/Goals: 10-14 days S  12/13- Con't CIR- PT and OT- team conference today to determine length of stay.  2.  Mechanical Aortic valve/Antithrombotics: -DVT/anticoagulation:  Pharmaceutical: Coumadin with lovenox bridge till INR>2.5 12/13- INR 1.8 today- per pharmacy             -antiplatelet therapy: N/A 3. Residual limb pain: continue Oxycodone prn.  4. Mood: LCSW to follow for evaluation and support.              -antipsychotic agents: N/A 5. Neuropsych: This patient is capable of making decisions on his own behalf. 6. Skin/Wound Care: Routine pressure relief measures.              --wound VAC come off POD# 7 on 12/09  12/8- MASD in groin- WOC started silver antimicrobial wicking fabric  12/9- wound VAC is off- con't dressings- will order shrinkers from [facility] with prosthetic education- with his BMI of 50, will have a harder time with BKA/prosthesis.   12/13- BKA looking better/has retention sutures and staples- explained will be 3-4 minimum before removed by Surgeon.  7. Fluids/Electrolytes/Nutrition: Monitor I/O. Check CMET in am.  8. Sepsis secondary to MSSA bacteremia: continue IV cefazolin with end date 09/24/21.  12/13- ESR 65 down from 109- CRP 3.4- down from 12.8- so doing much better- con't weekly labs.              --weekly labs 9. T2DM: Uncontrolled with A1C-7.8. Monitor BS ac/hs and use SSI for elevated BS             --start metformin 267m daily             --continue insulin glargine 15 units (was on 30 units PTA) with SSI for elevated BS.    12/12- CBGs controlled- con't regimen 10. HTN: Monitor BP TID--has been running low             --Lisinopril 10 mg with metoprolol bid. Monitor for orthostatic changes.    12/10- BP well controlled- con't regimen 11. Super morbid obesity: BMI 54.5. will consult dietician to educate on diet --Educate on weight loss and dietary changes to help promote overall health and mobility.   12/9- BMI down to 50- con't education 12/12- BMI stable- con't decrease in  intake 12. Hyponatremia: Recheck lytes in am. 12/8- Na 128- will recheck in AM- if still <130, will put on fluid restriction  12/9- Na back up to 133- will recheck weekly and prn.  13. Malnutrition: Albumin has dropped from 3.2-->2.7 today.             --add protein supplement TID.  12/13- will change BMP to CMP so can follow labs.  14. Leucocytosis:Has been fluctuating 15.3-->12.3-->14.3  12/8- WBC down to 10.7- so doing much better- con't to monitor  12/12- WBC down to 9.9- con't to monitor --Monitor for fevers and trends.     LOS: 6 days A FACE TO FACE EVALUATION WAS PERFORMED  Rishawn Walck 09/08/2021, 8:29 AM

## 2021-09-08 NOTE — Progress Notes (Signed)
Occupational Therapy Session Note  Patient Details  Name: Adam Long MRN: [id number] Date of Birth: 15-May-1976  Today's Date: 09/08/2021 OT Individual Time: 0737-1062 OT Individual Time Calculation (min): 70 min    Short Term Goals: Week 1:  OT Short Term Goal 1 (Week 1): STGs = LTGs   Skilled Therapeutic Interventions/Progress Updates:    Pt greeted at time of session semireclined in bed resting, having questions about DC date of 12/19, answered questions as able but recommended speak with MD and SW regarding DME, IV abx, etc. Pt grateful for information and states will follow up with MD tomorrow morning during rounds. Rewrapped ACE bandage on residual limb as it had started to fall off, educating on technique and figure eight throughout. Supine > sit Supervision and donned limb guard with Min A. Stand pivot Supervision bed <> wheelchair during session with bariwalker. Extensive discussion with the pt regarding DME, wheelchair and RW, home set up, etc. Self propel room <> main gym with Supervision and focused on BUE strengthening first at rebounder trampoline with 2kg ball for 1x15-20 of the following: throws, throw +chest press, throw + overhead press, throw + torso twist. 1x10 wheelchair pushups with cues and demonstration for form, able to clear buttocks. 1x15-20 of the following with 12# dowel: bicep curl, chest press, overhead press, FWD and backward circles. Back in room, educated on mirror therapy and reviewed various ROM that can be performed with RLE to alleviate phantom limb pain in residual limb with good carryover noted. Sit > supine Supervision alarm on call bell in reach.   Therapy Documentation Precautions:  Precautions Precautions: Fall, Other (comment) Required Braces or Orthoses: Other Brace Other Brace: limb protector Restrictions Weight Bearing Restrictions: Yes LLE Weight Bearing: Non weight bearing Other Position/Activity Restrictions: L limb  protector     Therapy/Group: Individual Therapy  [name] 09/08/2021, 9:14 AM

## 2021-09-08 NOTE — Progress Notes (Signed)
Physical Therapy Session Note  Patient Details  Name: Adam Long MRN: [id number] Date of Birth: 11-06-1975  Today's Date: 09/08/2021 PT Individual Time: 0800-0912 PT Individual Time Calculation (min): 72 min   Short Term Goals: Week 1:  PT Short Term Goal 1 (Week 1): =LTGs d/t ELOS  Skilled Therapeutic Interventions/Progress Updates:    pt received in bed and agreeable to therapy. Pt's limb with no dressing after MD consult, therapist redressed with nonadherent pad, gauze and ace wrap. Pt reports 4/10 pain during session, nsg provided medication during session. Supine>sit mod I and pt dressed with distant supervision, including getting clothes out prior to therapist's arrival. Donned limbguard with supervision. Sit to stand and transfers supervision fading to mod I by end of session. Mod I w/c propulsion throughout session, including w/c parts.  Pt first navigated 5" curb step 2 x 5 with RW and CGA for endurance and strength for steps at home. Transitioned to ortho gym for car transfer with supervision. Pt picked up wash cloth from floor with reacher mod I. Pt returned to room and performed Stand pivot transfer to recliner with RW and supervision. Provided pt education on pain management techniques and d/c planning, including HEP for home management.   Therapy Documentation Precautions:  Precautions Precautions: Fall, Other (comment) Required Braces or Orthoses: Other Brace Other Brace: limb protector Restrictions Weight Bearing Restrictions: Yes LLE Weight Bearing: Non weight bearing Other Position/Activity Restrictions: L limb protector     Therapy/Group: Individual Therapy  Jacque Byron C Murriel Eidem 09/08/2021, 10:00 AM

## 2021-09-08 NOTE — Progress Notes (Signed)
Occupational Therapy Session Note  Patient Details  Name: Adam Long MRN: [id number] Date of Birth: [birth year]  Today's Date: 09/08/2021 OT Individual Time: 7062-3762 OT Individual Time Calculation (min): 60 min    Short Term Goals: Week 1:  OT Short Term Goal 1 (Week 1): STGs = LTGs Week 2:    Week 3:     Skilled Therapeutic Interventions/Progress Updates:    Patient seated at w/c LOF upon arrival, pt complete stand pivot transfer with CGA from the w/c to the recliner incorporating the RW for balance.  The pt was able to complete from sit to stand with CGA and was able to make adjustments in his position  to improve his dynamic standing balance for increase functional independence during standing task.  The pt was able to ambulate using the RW and his w/c in close proximity to the sink areas to complete a simple grooming task involving washing his face, brushing his teeth and hair with CGA and verbal instruction for task initiation and completion with greater ease as safety.  The pt went on to complete UB theraex using a 2lb dumb bell 2 sets of 15 for bicep curls, shld flexion, and horizontal abduction with rest breaks as needed.  The pt was instructed in relaxation breathing , as much the benefits. The pt complete resistive exercises using a 2lb dowel to improve UB strength for greater safety and independence during functional task performance  The pt transferred from the w/c to the recliner with his bedside table nearby with the call light and alarm activated and in place.  The pt reported no pain response this tx session and all addition needs were addressed. .   Therapy Documentation Precautions:  Precautions Precautions: Fall, Other (comment) Required Braces or Orthoses: Other Brace Other Brace: limb protector Restrictions Weight Bearing Restrictions: Yes LLE Weight Bearing: Non weight bearing Other Position/Activity Restrictions: L limb protector General:   Vital Signs:    Pain:   ADL: Vision   Perception    Praxis   Balance   Exercises:   Other Treatments:     Therapy/Group: Individual Therapy  Lavona Mound 09/08/2021, 1:11 PM

## 2021-09-09 LAB — CBC WITH DIFFERENTIAL/PLATELET
Abs Immature Granulocytes: 0.09 10*3/uL — ABNORMAL HIGH (ref 0.00–0.07)
Basophils Absolute: 0.1 10*3/uL (ref 0.0–0.1)
Basophils Relative: 1 %
Eosinophils Absolute: 0.1 10*3/uL (ref 0.0–0.5)
Eosinophils Relative: 1 %
HCT: 32.4 % — ABNORMAL LOW (ref 39.0–52.0)
Hemoglobin: 10.4 g/dL — ABNORMAL LOW (ref 13.0–17.0)
Immature Granulocytes: 1 %
Lymphocytes Relative: 21 %
Lymphs Abs: 2.1 10*3/uL (ref 0.7–4.0)
MCH: 27.2 pg (ref 26.0–34.0)
MCHC: 32.1 g/dL (ref 30.0–36.0)
MCV: 84.8 fL (ref 80.0–100.0)
Monocytes Absolute: 0.8 10*3/uL (ref 0.1–1.0)
Monocytes Relative: 8 %
Neutro Abs: 6.5 10*3/uL (ref 1.7–7.7)
Neutrophils Relative %: 68 %
Platelets: 328 10*3/uL (ref 150–400)
RBC: 3.82 MIL/uL — ABNORMAL LOW (ref 4.22–5.81)
RDW: 15.3 % (ref 11.5–15.5)
WBC: 9.7 10*3/uL (ref 4.0–10.5)
nRBC: 0 % (ref 0.0–0.2)

## 2021-09-09 LAB — GLUCOSE, CAPILLARY
Glucose-Capillary: 136 mg/dL — ABNORMAL HIGH (ref 70–99)
Glucose-Capillary: 141 mg/dL — ABNORMAL HIGH (ref 70–99)
Glucose-Capillary: 122 mg/dL — ABNORMAL HIGH (ref 70–99)
Glucose-Capillary: 130 mg/dL — ABNORMAL HIGH (ref 70–99)

## 2021-09-09 LAB — PROTIME-INR
INR: 2 — ABNORMAL HIGH (ref 0.8–1.2)
Prothrombin Time: 22.7 seconds — ABNORMAL HIGH (ref 11.4–15.2)

## 2021-09-09 MED ORDER — HEPARIN SOD (PORK) LOCK FLUSH 100 UNIT/ML IV SOLN
250.0000 [IU] | INTRAVENOUS | Status: DC | PRN
  Administered 2021-09-14: 10:00:00 250 [IU]
  Filled 2021-09-09 (×2): qty 2.5

## 2021-09-09 MED ORDER — WARFARIN SODIUM 7.5 MG PO TABS
25.0000 mg | ORAL_TABLET | Freq: Once | ORAL | Status: AC
  Administered 2021-09-09: 16:00:00 25 mg via ORAL
  Filled 2021-09-09: qty 2

## 2021-09-09 MED ORDER — HEPARIN SOD (PORK) LOCK FLUSH 100 UNIT/ML IV SOLN
250.0000 [IU] | Freq: Every day | INTRAVENOUS | Status: DC
  Filled 2021-09-09: qty 2.5

## 2021-09-09 MED ORDER — GABAPENTIN 300 MG PO CAPS
600.0000 mg | ORAL_CAPSULE | Freq: Two times a day (BID) | ORAL | Status: DC
  Administered 2021-09-09 – 2021-09-14 (×11): 600 mg via ORAL
  Filled 2021-09-09 (×11): qty 2

## 2021-09-09 NOTE — Progress Notes (Signed)
Occupational Therapy Weekly Progress Note  Patient Details  Name: Adam Long MRN: [id number] Date of Birth: 19-Jun-1976  Beginning of progress report period: September 03, 2021 End of progress report period: September 09, 2021    Patient did not have STGs as he is staying longer than originally anticipated 2/2 IV abx, pain control, and no follow up after leaving CIR. Pt with Mod I goals and currently at a Supervision level for transfers and ADL tasks with Mod I goals.   Patient continues to demonstrate the following deficits: muscle weakness, decreased cardiorespiratoy endurance, decreased coordination and decreased motor planning, and decreased sitting balance, decreased standing balance, decreased postural control, and decreased balance strategies and therefore will continue to benefit from skilled OT intervention to enhance overall performance with BADL and iADL.  Patient progressing toward long term goals..  Continue plan of care.  OT Short Term Goals Week 1:  OT Short Term Goal 1 (Week 1): STGs = LTGs OT Short Term Goal 1 - Progress (Week 1): Progressing toward goal Week 2:  OT Short Term Goal 1 (Week 2): STGs = LTGs   Therapy Documentation Precautions:  Precautions Precautions: Fall, Other (comment) Required Braces or Orthoses: Other Brace Other Brace: limb protector Restrictions Weight Bearing Restrictions: Yes LLE Weight Bearing: Non weight bearing Other Position/Activity Restrictions: L limb protector    Therapy/Group: Individual Therapy  [name] 09/09/2021, 12:39 PM

## 2021-09-09 NOTE — Progress Notes (Signed)
PROGRESS NOTE   Subjective/Complaints:  Pt reports was up all night due to pain- mainly phantom pain- would like to increase gabapentin.  Phantom pain "kicked his butt".  Has shrinker, but hasn't been put on his L BKA.  D/w nursing have to place shrinker, not just ACE wrap.     ROS:  Pt denies SOB, abd pain, CP, N/V/C/D, and vision changes    Objective:   No results found. Recent Labs    09/07/21 0330 09/09/21 0357  WBC 9.9 9.7  HGB 10.3* 10.4*  HCT 32.9* 32.4*  PLT 336 328   Recent Labs    09/07/21 0330  NA 132*  K 4.2  CL 99  CO2 27  GLUCOSE 132*  BUN 13  CREATININE 0.58*  CALCIUM 8.5*    Intake/Output Summary (Last 24 hours) at 09/09/2021 0837 Last data filed at 09/09/2021 0700 Gross per 24 hour  Intake 660 ml  Output 2475 ml  Net -1815 ml        Physical Exam: Vital Signs Blood pressure 117/74, pulse 76, temperature 98.2 F (36.8 C), temperature source Oral, resp. rate 14, height '5\' 10"'  (1.778 m), weight (!) 158.2 kg, SpO2 97 %.       General: awake, alert, appropriate, sitting up in bed; looks exhausted; BMI 50; NAD HENT: conjugate gaze; oropharynx moist CV: regular rate; no JVD Pulmonary: CTA B/L; no W/R/R- good air movement GI: soft, NT, ND, (+)BS; protuberant Psychiatric: appropriate Neurological: Ox3  Skin: L BKA- retention sutures AND staples in place- trace pink/erythema localized to sutures/staples- no drainage- moderate edema/swelling- no dog ears anymore.  C/D/I today- with ACE wrap, not shrinker in place   Assessment/Plan: 1. Functional deficits which require 3+ hours per day of interdisciplinary therapy in a comprehensive inpatient rehab setting. Physiatrist is providing close team supervision and 24 hour management of active medical problems listed below. Physiatrist and rehab team continue to assess barriers to discharge/monitor patient progress toward functional and  medical goals  Care Tool:  Bathing    Body parts bathed by patient: Right arm, Left arm, Chest, Abdomen, Front perineal area, Right upper leg, Left upper leg, Face, Right lower leg, Buttocks   Body parts bathed by helper: Buttocks Body parts n/a: Left lower leg   Bathing assist Assist Level: Supervision/Verbal cueing     Upper Body Dressing/Undressing Upper body dressing   What is the patient wearing?: Pull over shirt    Upper body assist Assist Level: Set up assist    Lower Body Dressing/Undressing Lower body dressing      What is the patient wearing?: Underwear/pull up, Pants     Lower body assist Assist for lower body dressing: Supervision/Verbal cueing     Toileting Toileting    Toileting assist Assist for toileting: Supervision/Verbal cueing Assistive Device Comment: Rolling walker   Transfers Chair/bed transfer  Transfers assist     Chair/bed transfer assist level: Contact Guard/Touching assist Chair/bed transfer assistive device: Programmer, multimedia   Ambulation assist      Assist level: Contact Guard/Touching assist Assistive device: Walker-rolling Max distance: 30 ft   Walk 10 feet activity   Assist  Assist level: Contact Guard/Touching assist Assistive device: Walker-rolling   Walk 50 feet activity   Assist Walk 50 feet with 2 turns activity did not occur: Safety/medical concerns         Walk 150 feet activity   Assist Walk 150 feet activity did not occur: Safety/medical concerns         Walk 10 feet on uneven surface  activity   Assist Walk 10 feet on uneven surfaces activity did not occur: Safety/medical concerns         Wheelchair     Assist Is the patient using a wheelchair?: Yes Type of Wheelchair: Manual    Wheelchair assist level: Supervision/Verbal cueing Max wheelchair distance: 80 ft    Wheelchair 50 feet with 2 turns activity    Assist        Assist Level:  Supervision/Verbal cueing   Wheelchair 150 feet activity     Assist      Assist Level: Minimal Assistance - Patient > 75%   Blood pressure 117/74, pulse 76, temperature 98.2 F (36.8 C), temperature source Oral, resp. rate 14, height '5\' 10"'  (1.778 m), weight (!) 158.2 kg, SpO2 97 %.  Medical Problem List and Plan: 1. Functional deficits secondary to L BKA             -patient may shower but incision must be covered             -ELOS/Goals: 10-14 days S  12/14- pt agreed to stay til 12/19 this AM due to IV ABX- con't CIR- PT and OT- work on higher level function.  2.  Mechanical Aortic valve/Antithrombotics: -DVT/anticoagulation:  Pharmaceutical: Coumadin with lovenox bridge till INR>2.5 12/14- INR 2.0- per pharmacy             -antiplatelet therapy: N/A 3. Residual limb pain: continue Oxycodone prn.   12/14- will increase gabapentin to 600 mg BID- and increase as needed for nerve pain 4. Mood: LCSW to follow for evaluation and support.              -antipsychotic agents: N/A 5. Neuropsych: This patient is capable of making decisions on his own behalf. 6. Skin/Wound Care: Routine pressure relief measures.              --wound VAC come off POD# 7 on 12/09  12/8- MASD in groin- WOC started silver antimicrobial wicking fabric  12/9- wound VAC is off- con't dressings- will order shrinkers from [place] with prosthetic education- with his BMI of 50, will have a harder time with BKA/prosthesis.   12/13- BKA looking better/has retention sutures and staples- explained will be 3-4 minimum before removed by Surgeon.  7. Fluids/Electrolytes/Nutrition: Monitor I/O. Check CMET in am.  8. Sepsis secondary to MSSA bacteremia: continue IV cefazolin with end date 09/24/21.  12/13- ESR 65 down from 109- CRP 3.4- down from 12.8- so doing much better- con't weekly labs.              --weekly labs 9. T2DM: Uncontrolled with A1C-7.8. Monitor BS ac/hs and use SSI for elevated BS             --start  metformin 251m daily             --continue insulin glargine 15 units (was on 30 units PTA) with SSI for elevated BS.    12/12- CBGs controlled- con't regimen 10. HTN: Monitor BP TID--has been running low             --  Lisinopril 10 mg with metoprolol bid. Monitor for orthostatic changes.   12/10- BP well controlled- con't regimen 11. Super morbid obesity: BMI 54.5. will consult dietician to educate on diet --Educate on weight loss and dietary changes to help promote overall health and mobility.   12/9- BMI down to 50- con't education 12/12- BMI stable- con't decrease in intake 12. Hyponatremia: Recheck lytes in am. 12/8- Na 128- will recheck in AM- if still <130, will put on fluid restriction  12/9- Na back up to 133- will recheck weekly and prn.  13. Malnutrition: Albumin has dropped from 3.2-->2.7 today.             --add protein supplement TID.  12/13- will change BMP to CMP so can follow labs.  14. Leucocytosis:Has been fluctuating 15.3-->12.3-->14.3  12/8- WBC down to 10.7- so doing much better- con't to monitor  12/12- WBC down to 9.9- con't to monitor --Monitor for fevers and trends.   15. Nerve/phantom pain  12/14- will increase gabapentin to 600 mg BID- can increase while I'm gone as needed   LOS: 7 days A FACE TO FACE EVALUATION WAS PERFORMED  Adam Long 09/09/2021, 8:37 AM

## 2021-09-09 NOTE — Progress Notes (Addendum)
Patient ID: Adam Long, male   DOB: 1976-09-18, 45 y.o.   MRN: [id number] Pt wanting to discharge sooner and team and MD feel can be ready by tomorrow. Have let Pam-IV antibiotics know and equipment should be here today or early tomorrow morning. Pt happy with plan  8:33 AM After MD spoke with plan now back to Monday 12/19 for better pain management

## 2021-09-09 NOTE — Progress Notes (Signed)
ANTICOAGULATION CONSULT NOTE - Follow Up Consult  Pharmacy Consult for Lovenox, Coumadin Indication:  Mech. AVR  Allergies  Allergen Reactions   Pollen Extract Other (See Comments)    Runny nose, watery eyes, sneezing    Patient Measurements: Height: 5\' 10"  (177.8 cm) Weight: (!) 158.2 kg (348 lb 12.3 oz) IBW/kg (Calculated) : 73  Vital Signs: Temp: 98.2 F (36.8 C) (12/14 0543) Temp Source: Oral (12/14 0543) BP: 117/74 (12/14 0543) Pulse Rate: 76 (12/14 0543)  Labs: Recent Labs    09/07/21 0330 09/08/21 0615 09/09/21 0357  HGB 10.3*  --  10.4*  HCT 32.9*  --  32.4*  PLT 336  --  328  LABPROT 19.4* 20.6* 22.7*  INR 1.6* 1.8* 2.0*  CREATININE 0.58*  --   --     Estimated Creatinine Clearance: 176.6 mL/min (A) (by C-G formula based on SCr of 0.58 mg/dL (L)).  Assessment: Anticoag: Mechanical Aortic valve/. [id number] Hgb 10.4 stable, INR up to 2. PTA - 10mg  qday  - Ensure has 25% total daily value of Vitamin K - pt taking 2 a day - Pt mentions that he is eating about ~40% more vegetables than outside the hospital  Goal of Therapy:  INR 2.5 (goal 2-3) Monitor platelets by anticoagulation protocol: Yes   Plan:  Lovenox 150mg  sq q12 hrs until INR>2.5. Warf 25 mg x 1   Kambra Beachem S. 09/11/21, PharmD, BCPS Clinical Staff Pharmacist [email or web address]  , Yamilett Anastos Stillinger 09/09/2021,9:08 AM

## 2021-09-09 NOTE — Progress Notes (Signed)
Recreational Therapy Session Note  Patient Details  Name: Adam Long MRN: [id number] Date of Birth: November 11, 1975 Today's Date: 09/09/2021  Pain: no c/o  Pt participated in animal assisted activity seated EOM reaching outside BOS to pet the therapy dog.  Pt easily engaged in conversation with LRT and pet partners team.  Pt discussing his dog at home and how much he enjoyed his pet.  Pt very appreciative of this visit. Pt response:  Harleigh Civello 09/09/2021, 4:01 PM

## 2021-09-09 NOTE — Progress Notes (Signed)
Physical Therapy Session Note  Patient Details  Name: Adam Long MRN: [id number] Date of Birth: 04-16-1976  Today's Date: 09/09/2021 PT Individual Time: 7829-5621, 1430-1530 PT Individual Time Calculation (min): 57 min, 60 min   Short Term Goals: Week 1:  PT Short Term Goal 1 (Week 1): =LTGs d/t ELOS  Skilled Therapeutic Interventions/Progress Updates:    Session 1: Pt seated EOB on arrival. Reporting 5/10 pain, premedicated. Rest and positioning provided as needed. Pt donned limb guard and shoe with distant supervision. Pt propelled w/c with BUE to/from day room mod I, VC for transfer set up.   Pt performed the following exercises to promote LE strength and endurance:  Squats with table taps, 3x 10 Standing Hip extension with VC and TC for upright posture and strong   Pt participated in Pet therapy during session and afterward shared his previous medical difficulties. Therapist provided active listening and emotional support.   While pt was participating in above exercises, visitor arrived. Stand pivot transfer back to w/c with supervision. Pt then propelled w/c back to room and performed Stand pivot transfer to recliner in same manner as above. Pt remained seated in recliner with visitor present and was left with all needs in reach and alarm active.    Session 2: pt received in bed and agreeable to therapy. Pt reported 6/10 pain in his residual limb, nsg provided pain medication during session. Supine>sit from flat bed mod I with incr time. Pt then performed ambulatory transfer to w/c with supervision, assist to manage IV pole. Pt propelled w/c with BUE throughout session mod I.   Pt performed the following exercises to promote LE strength and endurance with supervision and RW:  Sit to stand 3 x10 with supervision. Standing hip abduction with multimodal cues for hip rotation. Hops fwd and back over exercise tubing  Pt utilized UBE for 5 min on 3  for cardiovascular and global  endurance. Pt then returned to room and returned to bed in same manner as above. Pt remained in bed and doffed limb guard and shoe mod I, and was left with all needs in reach and alarm active.   Therapy Documentation Precautions:  Precautions Precautions: Fall, Other (comment) Required Braces or Orthoses: Other Brace Other Brace: limb protector Restrictions Weight Bearing Restrictions: Yes LLE Weight Bearing: Non weight bearing Other Position/Activity Restrictions: L limb protector General:       Therapy/Group: Individual Therapy  Juluis Rainier 09/09/2021, 10:57 AM

## 2021-09-09 NOTE — Progress Notes (Signed)
Occupational Therapy Discharge Summary  Patient Details  Name: Adam Long MRN: [id number] Date of Birth: August 23, 1976  Patient has met 6 of 7 long term goals due to improved activity tolerance, improved balance, postural control, ability to compensate for deficits, improved awareness, and improved coordination.  Patient to discharge at overall Modified Independent level.  Patient's care partner is independent to provide the necessary physical assistance at discharge.  Pt is Mod I from wheelchair level, self propel Mod I, stand pivot and short distance functional mobility Mod I with bariwalker, and ADL tasks with Mod I at sink level. Pt to go home with uncle who can assist with IADLs PRN.  Reasons goals not met: Pt has deferred shower transfer attempts as he does not want to shower at time of d/c, wants to wait until his residual limb is completely healed and he does not have to wrap it. Therefore this goal was unable to be met.  Recommendation:  Patient will benefit from ongoing skilled OT services in home health setting if able, to continue to advance functional skills in the area of iADL and Reduce care partner burden.  Equipment: Bariatric DABSC  Reasons for discharge: treatment goals met and discharge from hospital  Patient/family agrees with progress made and goals achieved: Yes  OT Discharge Precautions/Restrictions    Vital Signs Therapy Vitals Temp: 98.7 F (37.1 C) Temp Source: Oral Pulse Rate: 98 Resp: 18 BP: 104/77 Patient Position (if appropriate): Sitting Oxygen Therapy SpO2: 99 % O2 Device: Room Air ADL ADL Eating: Independent Grooming: Modified independent Where Assessed-Grooming: Sitting at sink Upper Body Bathing: Setup Where Assessed-Upper Body Bathing: Sitting at sink Lower Body Bathing: Setup Where Assessed-Lower Body Bathing: Sitting at sink, Standing at sink Upper Body Dressing: Modified independent (Device) Lower Body Dressing: Modified  independent Where Assessed-Lower Body Dressing: Edge of bed Toileting: Modified independent Toilet Transfer: Modified independent Social research officer, government: Not assessed ADL Comments: pt has politely declined to attempt shower transfers throughout CIR stay, he wants to defer shower until his limb has completely healed Vision Baseline Vision/History: 1 Wears glasses Patient Visual Report: No change from baseline Perception  Perception: Within Functional Limits Praxis Praxis: Intact Cognition Overall Cognitive Status: Within Functional Limits for tasks assessed Arousal/Alertness: Awake/alert Orientation Level: Oriented X4 Immediate Memory Recall: Sock;Blue;Bed Memory Recall Sock: Without Cue Memory Recall Blue: Without Cue Memory Recall Bed: Without Cue Safety/Judgment: Appears intact Sensation Coordination Gross Motor Movements are Fluid and Coordinated: No Fine Motor Movements are Fluid and Coordinated: Yes Coordination and Movement Description: Generalized weakness and COM shift d/t recent amputation, improved from baseline Motor  Motor Motor: Within Functional Limits Motor - Discharge Observations: significantly improved from baseline Trunk/Postural Assessment  Cervical Assessment Cervical Assessment: Within Functional Limits Thoracic Assessment Thoracic Assessment: Within Functional Limits Lumbar Assessment Lumbar Assessment: Within Functional Limits Postural Control Postural Control: Within Functional Limits  Balance Balance Balance Assessed: Yes Dynamic Sitting Balance Dynamic Sitting - Balance Support: During functional activity Dynamic Sitting - Level of Assistance: 6: Modified independent (Device/Increase time) (donning shrinker sock) Dynamic Standing Balance Dynamic Standing - Balance Support: During functional activity (toileting tasks) Dynamic Standing - Level of Assistance: 6: Modified independent (Device/Increase time) Extremity/Trunk Assessment RUE  Assessment RUE Assessment: Within Functional Limits LUE Assessment LUE Assessment: Within Functional Limits   Osric Klopf A Shaylynne Lunt 09/13/2021, 3:51 PM

## 2021-09-09 NOTE — Progress Notes (Signed)
Occupational Therapy Session Note  Patient Details  Name: Adam Long MRN: [id number] Date of Birth: 05-30-1976  Today's Date: 09/09/2021 OT Individual Time: 1696-7893 and 8101-7510 OT Individual Time Calculation (min): 58 min and 57 min   Short Term Goals: Week 1:  OT Short Term Goal 1 (Week 1): STGs = LTGs   Skilled Therapeutic Interventions/Progress Updates:    Session 1: Pt greeted at time of session bed level resting agreeable to OT session, politely declined ADL as he did this yesterday. Pt stating he spoke with MD this am, agreeable to stay until 12/19 2/2 no follow up after leaving. Encouraged pt to go ahead and purchase bariwalker independently as insurance will not cover. Pt having questions about removing dressing and applying shrinker, confirmed with nursing that this is OK for therapist to do. Removed gauze and abd pad, donned shrinker and pt with 7/10 pain but able to tolerate doffing. Pt with some phantom limb pain at this time, therapist education on and performed tapping/rubbing/tactile input to decrease abnormal sensations. Nursing aware of pain and request for pain meds. Donned limb guard sitting EOB Min A before stand pivot bed > wheelchair Supervision. Self propel to sink and pt performing face washing, UB bathing, and oral rinse with Set up. Limb loss education throughout and pt grateful. Self propel room <> ADL apartment and focused on IADL retraining to see carryover and recall from previous sessions, pt able to retrieve various items from fridge and cabinet with good safety and problem solving. Also demonstrated TTB transfer with the pt for future use when able to shower. Stand pivot wheelchair <> recliner with Supervision/CGA as well. Back in room, stand pivot to bed same manner. Alarm on call bell in reach.   Session 2: Pt greeted at time of session sitting up in recliner with RN setting up IV via PICC line. Once finished, pt agreeable to OT session and states 4/10  residual limb pain with nursing aware and pt able to participate in session. Equipment delivered to room today, educated pt on folding/unfolding and maneuvering new personal wheelchair parts. Stand pivot recliner > new wheelchair Supervision with bariwalker and pt able to demonstrate self propelling Mod I throughout room <> hall for short distance. Also demonstrated and reviewed functional use of drop arm BSC in room, how to adjust height and use drop arm feature. In gym, focused on UB strength with 6# dumbbells at 12# total for the following 1x15-20 reps: bicep curls, chest  press/punches, overhead press, lateral raises, ventral raises all with mirror for feedback. Back in room, stand pivot wheelchair > bed Supervision. Alarm on call bell in reach.     Therapy Documentation Precautions:  Precautions Precautions: Fall, Other (comment) Required Braces or Orthoses: Other Brace Other Brace: limb protector Restrictions Weight Bearing Restrictions: Yes LLE Weight Bearing: Non weight bearing Other Position/Activity Restrictions: L limb protector     Therapy/Group: Individual Therapy  [name] 09/09/2021, 7:13 AM

## 2021-09-09 NOTE — Patient Care Conference (Signed)
Inpatient RehabilitationTeam Conference and Plan of Care Update Date: 09/08/2021   Time: 11:33 AM    Patient Name: Adam Long      Medical Record Number: [id number]  Date of Birth: [birth year] Sex: Male         Room/Bed: 4M04C/4M04C-01 Payor Info: Payor: [employer]  / Plan: [employer] / Product Type: *No Product type* /    Admit Date/Time:  09/02/2021  3:26 PM  Primary Diagnosis:  Below-knee amputation of left lower extremity [facility])  Hospital Problems: Principal Problem:   Below-knee amputation of left lower extremity [facility])    Expected Discharge Date: Expected Discharge Date: 09/14/21  Team Members Present: Physician leading conference: Dr. Genice Rouge Social Worker Present: Dossie Der, LCSW Nurse Present: Kennyth Arnold, RN PT Present: Bernie Covey, PT OT Present: Earleen Newport, OT PPS Coordinator present : Fae Pippin, SLP     Current Status/Progress Goal Weekly Team Focus  Bowel/Bladder   cont B/B, last BM 09/05/21  remain cont.  asees q shift and PRN   Swallow/Nutrition/ Hydration             ADL's   Supervision-occasional CGA for standing tasks for bathing at sink, dressing, and toileting tasks. Ambulatory/hopping to/from bathroom with RW.  Mod I  standing balance/tolerance, limb loss education, ADL techniques and retraining, AE PRN   Mobility   supervision, CGA at times, gait up to 30 ft, stairs with min A  mod I w/c and transfers, supervision gait  d/c planning   Communication             Safety/Cognition/ Behavioral Observations            Pain   pain 5 of 10, ro surgical site  pain<3  assess pain q 4hr and PRN   Skin   surg. incision-BKA  proper healing, no signs of infection  assess skin q shift and PRN     Discharge Planning:  HOme with his uncle who can be there and provide supervision. Pt's insurance ending 12/31, probably can get [facility] but no PT unless goes OP   Team Discussion: Left BKA, BMI 50. Retention sutures in place. MASD to  groin, using Intradry. Using PRN medications q 4hr. Daily dressing change to left residual limb. Labs are adjusting. MD adjusting medications. [employer] coverage ends at the end of the month. Walking/hopping to/from bathroom. Bariatric drop arm recommended. Functional in WC and 30-40 ft hopping. Started Gabapentin for phantom pain. IV antibiotics until 09/24/2021. Patient on target to meet rehab goals: yes, mod I goals. Supervision currently.  *See Care Plan and progress notes for long and short-term goals.   Revisions to Treatment Plan:  Adjusting medications and finalizing discharge plans.  Teaching Needs: Family education, medication/pain management, skin/wound care, weight bearing precautions, residual limb care, transfer/gait training, etc.  Current Barriers to Discharge: Decreased caregiver support, Home enviroment access/layout, IV antibiotics, Wound care, Lack of/limited family support, Weight, Weight bearing restrictions, Medication compliance, and lack of insurance.  Possible Resolutions to Barriers: Family education Order DME Follow-up PT Follow-up HH for IV antibiotics     Medical Summary Current Status: started gabapentin for phantom pain- takes pain meds q4 hours- is helping for phantom pain- MASD in groins- interdry; L BKA looks great;  Barriers to Discharge: Decreased family/caregiver support;Home enviroment access/layout;Medical stability;Insurance for SNF coverage;IV antibiotics;Wound care;Weight bearing restrictions;Weight;Other (comments)  Barriers to Discharge Comments: [employer]- insurance ends of end of month; wants w/c 22 inch and RW- going to uncle's.  Possible Resolutions to [employer]: IV ABX done by 12/29- order drop arm BSC- on track for goals- Mod I- functional in w/c/short distances 3-40 ft- hopping; d/c Monday 12/19   Continued Need for Acute Rehabilitation Level of Care: The patient requires daily medical management by a physician with  specialized training in physical medicine and rehabilitation for the following reasons: Direction of a multidisciplinary physical rehabilitation program to maximize functional independence : Yes Medical management of patient stability for increased activity during participation in an intensive rehabilitation regime.: Yes Analysis of laboratory values and/or radiology reports with any subsequent need for medication adjustment and/or medical intervention. : Yes   I attest that I was present, lead the team conference, and concur with the assessment and plan of the team.   Kennyth Arnold G 09/09/2021, 1:20 PM

## 2021-09-10 LAB — GLUCOSE, CAPILLARY
Glucose-Capillary: 139 mg/dL — ABNORMAL HIGH (ref 70–99)
Glucose-Capillary: 142 mg/dL — ABNORMAL HIGH (ref 70–99)
Glucose-Capillary: 130 mg/dL — ABNORMAL HIGH (ref 70–99)
Glucose-Capillary: 102 mg/dL — ABNORMAL HIGH (ref 70–99)

## 2021-09-10 LAB — PROTIME-INR
INR: 2.3 — ABNORMAL HIGH (ref 0.8–1.2)
Prothrombin Time: 25.1 seconds — ABNORMAL HIGH (ref 11.4–15.2)

## 2021-09-10 MED ORDER — ENOXAPARIN SODIUM 150 MG/ML IJ SOSY
150.0000 mg | PREFILLED_SYRINGE | Freq: Two times a day (BID) | INTRAMUSCULAR | Status: DC
  Administered 2021-09-10 – 2021-09-11 (×3): 150 mg via SUBCUTANEOUS
  Filled 2021-09-10 (×6): qty 1

## 2021-09-10 MED ORDER — WARFARIN SODIUM 7.5 MG PO TABS
25.0000 mg | ORAL_TABLET | Freq: Once | ORAL | Status: AC
  Administered 2021-09-10: 25 mg via ORAL
  Filled 2021-09-10: qty 2

## 2021-09-10 NOTE — Progress Notes (Signed)
Occupational Therapy Session Note  Patient Details  Name: [name] MRN: [id number] Date of Birth: [birth year]  Today's Date: 09/10/2021 OT Individual Time: 0037-0488 OT Individual Time Calculation (min): 25 min    Short Term Goals: Week 2:  OT Short Term Goal 1 (Week 2): STGs = LTGs  Skilled Therapeutic Interventions/Progress Updates:  Skilled OT intervention completed with focus on BUE strengthening with HEP handout provided. Pt received seated in recliner, agreeable to session. Declined self-care as he completed with PT in previous session. Discussed d/c plans, with pt reporting he'd like something for use at home to keep up his strength. Provided pt an HEP for BUE strengthening using the green theraband including the following exercises to promote carryover of BUE strength at home needed for functional transfers and self-care tasks including the following:  Self-anchored shoulder flexion, 2x15 each arm Self-anchored bicep flexion, x15 each arm Horizontal abduction x15 Alternating chest presses x25  Cues needed for form and positioning throughout however pt with great technique to complete exercises independently at home. Pt left seated in recliner, with chair alarm on and all needs in reach at end of session.  Therapy Documentation Precautions:  Precautions Precautions: Fall Required Braces or Orthoses: Other Brace Other Brace: limb protector Restrictions Weight Bearing Restrictions: Yes LLE Weight Bearing: Non weight bearing Other Position/Activity Restrictions: L limb protector  Pain: No c/o pain   Therapy/Group: Individual Therapy  Petra Dumler E Mianna Iezzi 09/10/2021, 7:39 AM

## 2021-09-10 NOTE — Progress Notes (Signed)
Physical Therapy Weekly Progress Note  Patient Details  Name: Adam Long MRN: [id number] Date of Birth: [birth year]  Beginning of progress report period: September 03, 2021 End of progress report period: September 10, 2021  Today's Date: 09/10/2021 PT Individual Time: 0254-2706, 2376-2831 PT Individual Time Calculation (min): 58 min, 47 min   Patient has met 1 of 1 short term goals.  STGs=LTGs d/t short ELOS. Pt to d/c 12/19 at mod I level  Patient continues to demonstrate the following deficits muscle weakness, decreased cardiorespiratoy endurance, and decreased standing balance, decreased balance strategies, and difficulty maintaining precautions and therefore will continue to benefit from skilled PT intervention to increase functional independence with mobility.  Patient progressing toward long term goals..  Continue plan of care.  PT Short Term Goals Week 1:  PT Short Term Goal 1 (Week 1): =LTGs d/t ELOS Week 2:  PT Short Term Goal 1 (Week 2): =LTGs d/t ELOS  Skilled Therapeutic Interventions/Progress Updates:  Session 1:  pt received in bed and agreeable to therapy. Pt requesting to wash up and change. ambulatory transfer to w/c with supervision.  Pt bathed, doffed and donned shirt, shorts, and underwear with set up and able to direct care throughout. Pt then instructed on washing shrinker sock and did so with supervision. Donned new shrinker with  min A and education on technique. Pt educated on desensitization and continued residual limb care. Pt navigated room at w/c level throughout session mod I. Pt performed Stand pivot transfer to recliner with distant supervision, Sit to stand mod I to RW.   Pt stated that he has struggled with mental health and has joined online support groups. Therapist provided emotional support and discussed importance of mental health care if available. Therapist to provide information on support group and virtual peer support in hospital.   Pt  remained in recliner at end of session and was left with all needs in reach and alarm active.    Session 2: Pt seated in w/c on arrival and agreeable to therapy. Nsg present for IV care, pt missed 9 minutes of therapy for nsg care. Pt then performed Stand pivot transfer with supervision to w/c. Sit to stand mod I to RW throughout session. Pt reported fatigue from previous sessions but agreeable to practice stair navigation. Pt propelled w/c with BUE mod I to therapy gym, decr speed d/t fatigue. Pt then navigated 3" steps 2 x 4 with backward technique and RW, with extended seated rest break. Pt demoes anxiety about navigating stairs, especially when moving RW up stairs. Discussed that pt is concerned about losing balance, agreeable to working on balance in future sessions to improve confidence. Pt did best when using wall and therapists shoulder for support while moving walker, per home set up. Pt demoed extreme fatigue after stair navigation, becoming slightly impulsive while turning to sit in w/c but able to navigate safely with no more than CGA. Pt then propelled w/c >300 ft for endurance and UE strength. Pt then returned to room and performed Stand pivot transfer back to recliner in same manner, was left with all needs in reach and alarm active.   Therapy Documentation Precautions:  Precautions Precautions: Fall Required Braces or Orthoses: Other Brace Other Brace: limb protector Restrictions Weight Bearing Restrictions: Yes LLE Weight Bearing: Non weight bearing Other Position/Activity Restrictions: L limb protector    Therapy/Group: Individual Therapy  Mickel Fuchs 09/10/2021, 12:49 PM

## 2021-09-10 NOTE — Evaluation (Signed)
Recreational Therapy Assessment and Plan  Patient Details  Name: Adam Long MRN: [id number] Date of Birth: [birth year] Today's Date: 09/10/2021  Rehab Potential:  Good ELOS:   d/c 12/19  Assessment  Hospital Problem: Principal Problem:   Below-knee amputation of left lower extremity ([place])     Past Medical History:      Past Medical History:  Diagnosis Date   Anxiety     Aortic stenosis     Cellulitis and abscess of lower extremity 06/11/2019   Cellulitis of fourth toe of left foot     Cholelithiasis     Coronary artery disease      Nonobstructive CAD (40-50% LAD) 08/2019   Depression     Depression      Phreesia 09/27/2020   Depression      Phreesia 11/01/2020   Diabetes mellitus without complication ([place])      Phreesia 09/27/2020   Elevated troponin level not due myocardial infarction 11/11/2019   Essential hypertension     Gangrene of toe of left foot ([place]) 07/06/2019   Heart murmur      Phreesia 09/27/2020   Hyperlipidemia      Phreesia 09/27/2020   Hypertension      Phreesia 09/27/2020   Mixed hyperlipidemia     Morbid obesity ([place])     S/P aortic valve replacement with mechanical valve 12/05/2019    25 mm Carbomedics top hat bileaflet mechanical valve via partial upper hemi-sternotomy   Severe aortic stenosis 09/24/2019   Type 2 diabetes mellitus ([place])      Past Surgical History:       Past Surgical History:  Procedure Laterality Date   ABDOMINAL AORTOGRAM W/LOWER EXTREMITY N/A 07/06/2019    Procedure: ABDOMINAL AORTOGRAM W/LOWER EXTREMITY;  Surgeon: Elam Dutch, MD;  Location: [place] [facility];  Service: Cardiovascular;  Laterality: N/A;  Bilateral   AMPUTATION Left 07/09/2019    Procedure: LEFT FOURTH and Fifth TOE AMPUTATION.;  Surgeon: Rosetta Posner, MD;  Location: [place];  Service: Vascular;  Laterality: Left;   AMPUTATION Left 03/11/2021    Procedure: LEFT FOOT 5TH  AND 4TH RAY AMPUTATION;  Surgeon: Newt Minion, MD;  Location: [place];   Service: Orthopedics;  Laterality: Left;   AMPUTATION Left 08/28/2021    Procedure: AMPUTATION BELOW KNEE;  Surgeon: Newt Minion, MD;  Location: [place];  Service: Orthopedics;  Laterality: Left;   AORTIC VALVE REPLACEMENT N/A 12/05/2019    Procedure: PARTIAL STERNOTOMY FOR AORTIC VALVE REPLACEMENT (AVR), USING CARBOMEDICS SUPRA-ANNULAR TOP HAT 25MM;  Surgeon: Rexene Alberts, MD;  Location: [place];  Service: Open Heart Surgery;  Laterality: N/A;  No neck lines on left   CARDIAC VALVE REPLACEMENT N/A      Phreesia 09/27/2020   IR RADIOLOGY PERIPHERAL GUIDED IV START   10/05/2019   IR US GUIDE VASC ACCESS RIGHT   10/05/2019   MULTIPLE EXTRACTIONS WITH ALVEOLOPLASTY N/A 10/26/2019    Procedure: EXTRACTION OF TOOTH #'S 3, 5-11,19-28,  AND 32 WITH ALVEOLOPLASTY;  Surgeon: Lenn Cal, DDS;  Location: [place];  Service: Oral Surgery;  Laterality: N/A;   RIGHT HEART CATH AND CORONARY ANGIOGRAPHY N/A 09/24/2019    Procedure: RIGHT HEART CATH AND CORONARY ANGIOGRAPHY;  Surgeon: Sherren Mocha, MD;  Location: [place] [facility];  Service: Cardiovascular;  Laterality: N/A;   TEE WITHOUT CARDIOVERSION N/A 12/05/2019    Procedure: TRANSESOPHAGEAL ECHOCARDIOGRAM (TEE);  Surgeon: Rexene Alberts, MD;  Location: [place];  Service:  Open Heart Surgery;  Laterality: N/A;   TEE WITHOUT CARDIOVERSION N/A 09/01/2021    Procedure: TRANSESOPHAGEAL ECHOCARDIOGRAM (TEE);  Surgeon: Donato Heinz, MD;  Location: [facility];  Service: Cardiovascular;  Laterality: N/A;      Assessment & Plan Clinical Impression: Lior Hoen is a 45 year old male with history of T2DM, HTN, mechanical AVR/non-obst CAD-on coumadin, diabetic foot ulcer with gangrenous changes, subacute osteomyelitis s/p ray amputation 06/22 who was admitted on 08/27/21 with one week history of increase in edema and erythema, progressive drainage and some chills with nausea. He was transitioned to IV heparin in anticipation of surgery as foot not  felt to be viable and he was started on broad spectrum antibiotics. He underwent BKA by Dr. Sharol Given on 12/02. Blood cultures done showed MSSA bacteremia and TEE done which was negative for endocarditis. Dr. Juleen China recommends felt that bacteremia secondary to foot infection and antibiotics narrowed to cefazolin with end date of 09/24/21 to complete 4 weeks antibiotic regimen. Therapy has been ongoing and patient has had decline in ability to complete ADLs and decline in mobility with fatigue. CIR recommended due to functional decline..  Patient transferred to CIR on 09/02/2021.     Pt presents with decreased activity tolerance, decreased functional mobility, decreased balance and difficulty maintaining precautions. Met with pt today to discuss leisure interests, activity analysis/modifications and coping strategies. Pt is excited about upcoming discharge and can verbalize activity modifications with Mod I.    Plan No further TR    Recommendations for other services: None   Discharge Criteria: Patient will be discharged from TR if patient refuses treatment 3 consecutive times without medical reason.  If treatment goals not met, if there is a change in medical status, if patient makes no progress towards goals or if patient is discharged from hospital.  The above assessment, treatment plan, treatment alternatives and goals were discussed and mutually agreed upon: by patient  [place] 09/10/2021, 4:07 PM

## 2021-09-10 NOTE — Progress Notes (Incomplete)
Physical Therapy Session Note  Patient Details  Name: Adam Long MRN: [id number] Date of Birth: 01-Dec-1975  {CHL IP REHAB PT TIME CALCULATION:[id number]}  Short Term Goals: Week 1:  PT Short Term Goal 1 (Week 1): =LTGs d/t ELOS Week 2:     Skilled Therapeutic Interventions/Progress Updates:    pt received in bed and agreeable to therapy. Pt requesting to wash up and change. ambulatory transfer to w/c with supervision.  Pt bathed, doffed and donned shirt, shorts, and underwear with set up and able to direct care throughout. Pt then instructed on washing shrinker sock and did so with supervision. Donned new shrinker with  min A  Therapy Documentation Precautions:  Precautions Precautions: Fall Required Braces or Orthoses: Other Brace Other Brace: limb protector Restrictions Weight Bearing Restrictions: Yes LLE Weight Bearing: Non weight bearing Other Position/Activity Restrictions: L limb protector General:      Therapy/Group: Individual Therapy  Juluis Rainier 09/10/2021, 8:19 AM

## 2021-09-10 NOTE — Progress Notes (Signed)
PROGRESS NOTE   Subjective/Complaints:  No new complaints this morning Pain is better controlled He is able to sleep better Ready for d/c Monday    ROS:  Pt denies SOB, abd pain, CP, N/V/C/D, and vision changes    Objective:   No results found. Recent Labs    09/09/21 0357  WBC 9.7  HGB 10.4*  HCT 32.4*  PLT 328   No results for input(s): NA, K, CL, CO2, GLUCOSE, BUN, CREATININE, CALCIUM in the last 72 hours.   Intake/Output Summary (Last 24 hours) at 09/10/2021 1109 Last data filed at 09/10/2021 0700 Gross per 24 hour  Intake 2740 ml  Output 3650 ml  Net -910 ml        Physical Exam: Vital Signs Blood pressure 123/81, pulse 78, temperature 97.9 F (36.6 C), temperature source Oral, resp. rate 18, height '5\' 10"'  (1.778 m), weight (!) 154.3 kg, SpO2 98 %. General: awake, alert, appropriate, sitting up in bed; looks exhausted; BMI 50; NAD HENT: conjugate gaze; oropharynx moist CV: regular rate; no JVD Pulmonary: CTA B/L; no W/R/R- good air movement GI: soft, NT, ND, (+)BS; protuberant Psychiatric: appropriate Neurological: Ox3  Skin: L BKA with limp protector in place   Assessment/Plan: 1. Functional deficits which require 3+ hours per day of interdisciplinary therapy in a comprehensive inpatient rehab setting. Physiatrist is providing close team supervision and 24 hour management of active medical problems listed below. Physiatrist and rehab team continue to assess barriers to discharge/monitor patient progress toward functional and medical goals  Care Tool:  Bathing    Body parts bathed by patient: Right arm, Left arm, Chest, Abdomen, Front perineal area, Right upper leg, Left upper leg, Face, Right lower leg, Buttocks   Body parts bathed by helper: Buttocks Body parts n/a: Left lower leg   Bathing assist Assist Level: Supervision/Verbal cueing     Upper Body Dressing/Undressing Upper body  dressing   What is the patient wearing?: Pull over shirt    Upper body assist Assist Level: Set up assist    Lower Body Dressing/Undressing Lower body dressing      What is the patient wearing?: Underwear/pull up, Pants     Lower body assist Assist for lower body dressing: Supervision/Verbal cueing     Toileting Toileting    Toileting assist Assist for toileting: Supervision/Verbal cueing Assistive Device Comment: Rolling walker   Transfers Chair/bed transfer  Transfers assist     Chair/bed transfer assist level: Supervision/Verbal cueing Chair/bed transfer assistive device: Programmer, multimedia   Ambulation assist      Assist level: Contact Guard/Touching assist Assistive device: Walker-rolling Max distance: 30 ft   Walk 10 feet activity   Assist     Assist level: Contact Guard/Touching assist Assistive device: Walker-rolling   Walk 50 feet activity   Assist Walk 50 feet with 2 turns activity did not occur: Safety/medical concerns         Walk 150 feet activity   Assist Walk 150 feet activity did not occur: Safety/medical concerns         Walk 10 feet on uneven surface  activity   Assist Walk 10 feet on uneven surfaces  activity did not occur: Safety/medical concerns         Wheelchair     Assist Is the patient using a wheelchair?: Yes Type of Wheelchair: Manual    Wheelchair assist level: Supervision/Verbal cueing Max wheelchair distance: 80 ft    Wheelchair 50 feet with 2 turns activity    Assist        Assist Level: Supervision/Verbal cueing   Wheelchair 150 feet activity     Assist      Assist Level: Minimal Assistance - Patient > 75%   Blood pressure 123/81, pulse 78, temperature 97.9 F (36.6 C), temperature source Oral, resp. rate 18, height '5\' 10"'  (1.778 m), weight (!) 154.3 kg, SpO2 98 %.  Medical Problem List and Plan: 1. Functional deficits secondary to L BKA              -patient may shower but incision must be covered             -ELOS/Goals: 10-14 days S  12/14- pt agreed to stay til 12/19 this AM due to IV ABX- continue CIR- PT and OT- work on higher level function.  2.  Mechanical Aortic valve/Antithrombotics: -DVT/anticoagulation:  Pharmaceutical: Coumadin with lovenox bridge till INR>2.5. Appreciate pharmacy note- will d/c ensure given Vitamin K content             -antiplatelet therapy: N/A 3. Residual limb pain: continue Oxycodone prn. Encouraged to only use as needed.   12/14- will increase gabapentin to 600 mg BID- and increase as needed for nerve pain 4. Mood: LCSW to follow for evaluation and support.              -antipsychotic agents: N/A 5. Neuropsych: This patient is capable of making decisions on his own behalf. 6. Skin/Wound Care: Routine pressure relief measures.              --wound VAC come off POD# 7 on 12/09  12/8- MASD in groin- WOC started silver antimicrobial wicking fabric  12/9- wound VAC is off- con't dressings- will order shrinkers from [place] with prosthetic education- with his BMI of 50, will have a harder time with BKA/prosthesis.   12/13- BKA looking better/has retention sutures and staples- explained will be 3-4 minimum before removed by Surgeon.  7. Fluids/Electrolytes/Nutrition: Monitor I/O. Check CMET in am.  8. Sepsis secondary to MSSA bacteremia: continue IV cefazolin with end date 09/24/21.  12/13- ESR 65 down from 109- CRP 3.4- down from 12.8- so doing much better- con't weekly labs.              --weekly labs 9. T2DM: Uncontrolled with A1C-7.8. Monitor BS ac/hs and use SSI for elevated BS             --start metformin 291m daily             --continue insulin glargine 15 units (was on 30 units PTA) with SSI for elevated BS.    12/12- CBGs controlled- con't regimen 10. HTN: Monitor BP TID--has been running low             --Lisinopril 10 mg with metoprolol bid. Monitor for orthostatic changes.   12/10- BP well  controlled- con't regimen 11. Super morbid obesity: BMI 54.5. will consult dietician to educate on diet --Educate on weight loss and dietary changes to help promote overall health and mobility.   12/9- BMI down to 50- con't education 12/12- BMI stable- con't decrease in intake 12. Hyponatremia: Recheck lytes in  am. 12/8- Na 128- will recheck in AM- if still <130, will put on fluid restriction  12/9- Na back up to 133- will recheck weekly and prn.  13. Malnutrition: Albumin has dropped from 3.2-->2.7 today.             --add protein supplement TID.  12/13- will change BMP to CMP so can follow labs.  14. Leucocytosis:Has been fluctuating 15.3-->12.3-->14.3  12/8- WBC down to 10.7- so doing much better- con't to monitor  12/12- WBC down to 9.9- con't to monitor --Monitor for fevers and trends.   15. Nerve/phantom pain  12/14- will increase gabapentin to 600 mg BID- can increase while I'm gone as needed  12/15: appears to have helped, continue current regimen   LOS: 8 days A FACE TO FACE EVALUATION WAS PERFORMED  Adam Long 09/10/2021, 11:09 AM

## 2021-09-10 NOTE — Progress Notes (Signed)
ANTICOAGULATION CONSULT NOTE - Follow Up Consult  Pharmacy Consult for Lovenox, Coumadin Indication:  Mech. AVR  Allergies  Allergen Reactions   Pollen Extract Other (See Comments)    Runny nose, watery eyes, sneezing    Patient Measurements: Height: 5\' 10"  (177.8 cm) Weight: (!) 154.3 kg (340 lb 2.7 oz) IBW/kg (Calculated) : 73  Vital Signs: Temp: 97.9 F (36.6 C) (12/15 0512) Temp Source: Oral (12/15 0512) BP: 123/81 (12/15 0512) Pulse Rate: 78 (12/15 0512)  Labs: Recent Labs    09/08/21 0615 09/09/21 0357 09/10/21 0307  HGB  --  10.4*  --   HCT  --  32.4*  --   PLT  --  328  --   LABPROT 20.6* 22.7* 25.1*  INR 1.8* 2.0* 2.3*     Estimated Creatinine Clearance: 174 mL/min (A) (by C-G formula based on SCr of 0.58 mg/dL (L)).  Assessment: Anticoag: Mechanical Aortic valve/. [id number] Hgb 10.4 stable, INR up to 2.3 PTA - 10mg  qday  - Ensure has 25% total daily value of Vitamin K - pt taking 2 a day - Pt mentions that he is eating about ~40% more vegetables than outside the hospital  Goal of Therapy:  INR 2.5 (goal 2-3) Monitor platelets by anticoagulation protocol: Yes   Plan:  Lovenox 150mg  sq q12 hrs until INR>2.5. Warf 25 mg x 1 Daily INR   Manual Navarra S. 09/12/21, PharmD, BCPS Clinical Staff Pharmacist [email or web address]  , Gennie Eisinger Stillinger 09/10/2021,8:25 AM

## 2021-09-11 LAB — CBC WITH DIFFERENTIAL/PLATELET
Abs Immature Granulocytes: 0.09 10*3/uL — ABNORMAL HIGH (ref 0.00–0.07)
Basophils Absolute: 0.1 10*3/uL (ref 0.0–0.1)
Basophils Relative: 1 %
Eosinophils Absolute: 0.1 10*3/uL (ref 0.0–0.5)
Eosinophils Relative: 2 %
HCT: 31.7 % — ABNORMAL LOW (ref 39.0–52.0)
Hemoglobin: 10.2 g/dL — ABNORMAL LOW (ref 13.0–17.0)
Immature Granulocytes: 1 %
Lymphocytes Relative: 26 %
Lymphs Abs: 2.2 10*3/uL (ref 0.7–4.0)
MCH: 26.9 pg (ref 26.0–34.0)
MCHC: 32.2 g/dL (ref 30.0–36.0)
MCV: 83.6 fL (ref 80.0–100.0)
Monocytes Absolute: 0.7 10*3/uL (ref 0.1–1.0)
Monocytes Relative: 9 %
Neutro Abs: 5.1 10*3/uL (ref 1.7–7.7)
Neutrophils Relative %: 61 %
Platelets: 317 10*3/uL (ref 150–400)
RBC: 3.79 MIL/uL — ABNORMAL LOW (ref 4.22–5.81)
RDW: 15.2 % (ref 11.5–15.5)
WBC: 8.2 10*3/uL (ref 4.0–10.5)
nRBC: 0 % (ref 0.0–0.2)

## 2021-09-11 LAB — GLUCOSE, CAPILLARY
Glucose-Capillary: 145 mg/dL — ABNORMAL HIGH (ref 70–99)
Glucose-Capillary: 129 mg/dL — ABNORMAL HIGH (ref 70–99)
Glucose-Capillary: 134 mg/dL — ABNORMAL HIGH (ref 70–99)
Glucose-Capillary: 121 mg/dL — ABNORMAL HIGH (ref 70–99)

## 2021-09-11 LAB — PROTIME-INR
INR: 2.4 — ABNORMAL HIGH (ref 0.8–1.2)
Prothrombin Time: 26.4 seconds — ABNORMAL HIGH (ref 11.4–15.2)

## 2021-09-11 MED ORDER — WARFARIN SODIUM 7.5 MG PO TABS
25.0000 mg | ORAL_TABLET | Freq: Once | ORAL | Status: AC
  Administered 2021-09-11: 25 mg via ORAL
  Filled 2021-09-11: qty 2

## 2021-09-11 MED ORDER — OXYCODONE HCL 5 MG PO TABS
10.0000 mg | ORAL_TABLET | ORAL | Status: DC | PRN
  Administered 2021-09-12 – 2021-09-13 (×2): 10 mg via ORAL
  Filled 2021-09-11 (×2): qty 2

## 2021-09-11 NOTE — Progress Notes (Signed)
Physical Therapy Discharge Summary  Patient Details  Name: Adam Long MRN: [id number] Date of Birth: [birth year]  Patient has met 4 of 5 long term goals due to improved activity tolerance, improved balance, increased strength, decreased pain, and ability to compensate for deficits.  Patient to discharge at a wheelchair level Modified Independent.   Patient's care partner is independent to provide the necessary physical assistance at discharge. Pt to d/c home with his uncle. Pt is largely mod I with short distance gait and transfers, requires assist for backwards navigation of stairs to manage RW. Pt provided with HEP and demonstrated understanding.  Reasons goals not met: Pt did not meet ambulation goal d/t endurance deficits but is ambulating 30-40 ft and is functional for d/c.   Recommendation:  Patient will benefit from ongoing skilled PT services in outpatient setting to continue to advance safe functional mobility, address ongoing impairments in strength and ROM, prepare for and initiate prosthetic use, and minimize fall risk.  Equipment: 24x18 w/c and cushion  Reasons for discharge: treatment goals met and discharge from hospital  Patient/family agrees with progress made and goals achieved: Yes  PT Discharge Precautions/Restrictions Precautions Precautions: Fall Precaution Comments: limb guard Required Braces or Orthoses: Other Brace Other Brace: limb protector Restrictions Weight Bearing Restrictions: Yes LLE Weight Bearing: Non weight bearing Other Position/Activity Restrictions: L limb protector Vital Signs Therapy Vitals Temp: 98 F (36.7 C) Temp Source: Oral Pulse Rate: 94 Resp: 18 BP: 99/68 Patient Position (if appropriate): Sitting Oxygen Therapy SpO2: 100 % O2 Device: Room Air Pain   Pain Interference Pain Interference Pain Effect on Sleep: 2. Occasionally Pain Interference with Therapy Activities: 1. Rarely or not at all Pain Interference with  Day-to-Day Activities: 1. Rarely or not at all Vision/Perception  Perception Perception: Within Functional Limits Praxis Praxis: Intact  Cognition Overall Cognitive Status: Within Functional Limits for tasks assessed Arousal/Alertness: Awake/alert Orientation Level: Oriented X4 Year: 2022 Month: December Day of Week: Correct Memory: Appears intact Awareness: Appears intact Problem Solving: Appears intact Safety/Judgment: Appears intact Sensation Sensation Light Touch: Appears Intact Hot/Cold: Appears Intact Proprioception: Appears Intact Coordination Gross Motor Movements are Fluid and Coordinated: No Coordination and Movement Description: Generalized weakness and COM shift d/t recent amputation, improved from baseline Motor  Motor Motor: Within Functional Limits Motor - Skilled Clinical Observations: generalized weakness but significantly improved Motor - Discharge Observations: significantly improved from baseline  Mobility Transfers Transfers: Sit to Stand;Stand Pivot Transfers;Stand to Sit Sit to Stand: Independent with assistive device Stand to Sit: Independent with assistive device Stand Pivot Transfers: Independent with assistive device Transfer (Assistive device): Rolling walker Locomotion  Gait Ambulation: Yes Gait Assistance: Independent with assistive device Gait Distance (Feet): 30 Feet Assistive device: Rolling walker Gait Assistance Details: Verbal cues for sequencing;Verbal cues for precautions/safety;Verbal cues for safe use of DME/AE;Verbal cues for technique Gait Gait: Yes Gait Pattern: Within Functional Limits Gait Pattern: Step-to pattern Gait velocity: decreased Stairs / Additional Locomotion Stairs: Yes Stairs Assistance: Minimal Assistance - Patient > 75% Stair Management Technique: Backwards;With walker Number of Stairs: 4 Height of Stairs: 3 Ramp: Independent with assistive device Curb: Diplomatic Services operational officer Mobility: Yes Wheelchair Assistance: Independent with Camera operator: Both upper extremities Wheelchair Parts Management: Independent Distance: 300+ ft  Trunk/Postural Assessment  Cervical Assessment Cervical Assessment: Within Functional Limits Thoracic Assessment Thoracic Assessment: Within Functional Limits Lumbar Assessment Lumbar Assessment: Within Functional Limits Postural Control Postural Control: Within Functional Limits  Balance Balance Balance Assessed: Yes  Dynamic Sitting Balance Dynamic Sitting - Balance Support: Feet supported;Bilateral upper extremity supported Dynamic Sitting - Level of Assistance: 6: Modified independent (Device/Increase time) Static Standing Balance Static Standing - Level of Assistance: 6: Modified independent (Device/Increase time) Dynamic Standing Balance Dynamic Standing - Balance Support: During functional activity Dynamic Standing - Level of Assistance: 6: Modified independent (Device/Increase time) Extremity Assessment      RLE Assessment RLE Assessment: Within Functional Limits General Strength Comments: Grossly >4+/5 LLE Assessment LLE Assessment: Exceptions to [facility] LLE Strength LLE Overall Strength Comments: limited MMT d/t recent amputation Left Hip Flexion: 4+/5 Left Knee Flexion: 3+/5 Left Knee Extension: 3+/5    Olivia C Brittain 09/11/2021, 5:12 PM

## 2021-09-11 NOTE — Progress Notes (Signed)
Occupational Therapy Session Note  Patient Details  Name: Adam Long MRN: [id number] Date of Birth: Sep 06, 1976  Today's Date: 09/11/2021 OT Individual Time: 2376-2831 OT Individual Time Calculation (min): 54 min    Short Term Goals: Week 2:  OT Short Term Goal 1 (Week 2): STGs = LTGs  Skilled Therapeutic Interventions/Progress Updates:  Skilled OT intervention completed with focus on BUE strengthening utilizing the teach back method for HEP compliance, BUE endurance, and education on safety/home management strategies. Pt received seated in recliner, agreeable to session however reporting that he feels like therapy sessions at this point are "extra credit" as he was set to d/c sooner and feels ready to go home. Focus of therapy this session in allowing pt to problem solve situations while maintaining his independence, to increase pt's confidence in d/c from therapies where help will be more limited. While seated in recliner, pt able to complete BUE HEP while following the handout provided while using teach back method to verbalize to therapist correct form, using the green theraband including the following:   Bicep flexion x10 each arm Shoulder flexion x10 each arm Horizontal shoulder abduction x10 Alternating chest presses x10  Cues still needed for form and positioning specifically with hand placement to prevent shoulder impingement, however pt with ability to self-correct and complete with mod I this session, demonstrating compliance and safety for home usage. Pt requesting to wash shrinker- with supervision needed for sit > stand and stand pivot while using RW to w/c. Mod I for washing shrinker, with education provided about types of soaps recommended to clean shrinker, and if placed in washing machine to put inside pillow case to protect/secure shrinker from other particles. NT in room to assess pt's blood sugar. Pt self-propelled about 200 ft to [employer] window hallway in w/c with  supervision for BUE and cardiovascular endurance needed for community mobility. While propelling, education provided on community re-integration, accessibility tips and management and energy conservation strategies. Transported pt in w/c with total A due to fatigue, with pt wanting to propel again, completing about 100 ft. Pt propelled into room, and encouraged to set up his environment/transfer with supervision only to practice independence with set up vs having items always brought to him. Pt attempted to pick up bariatric RW to slide in front of pt with leg rests still donned to set up for transfer. Education provided on doffing leg rests first, then using w/c to propel with RLE to reach RW for setting up the transfer vs heavy lifting. Educated pt that maintaining the integrity of his shoulders and BUE is extremely important to maintaining independence with transfers and all functional tasks, so to utilize modifications as much as possible. Completed stand and stand pivot using RW to recliner with supervision. Pt left seated in recliner, with chair alarm on and all needs in reach at end of session.  Therapy Documentation Precautions:  Precautions Precautions: Fall Required Braces or Orthoses: Other Brace Other Brace: limb protector Restrictions Weight Bearing Restrictions: Yes LLE Weight Bearing: Non weight bearing Other Position/Activity Restrictions: L limb protector  Pain: No c/o pain   Therapy/Group: Individual Therapy  Anfernee Peschke E Rosary Filosa 09/11/2021, 7:44 AM

## 2021-09-11 NOTE — Progress Notes (Signed)
ANTICOAGULATION CONSULT NOTE - Follow Up Consult  Pharmacy Consult for Lovenox, Coumadin Indication:  Mech. AVR  Allergies  Allergen Reactions   Pollen Extract Other (See Comments)    Runny nose, watery eyes, sneezing    Patient Measurements: Height: 5\' 10"  (177.8 cm) Weight: (!) 154.3 kg (340 lb 2.7 oz) IBW/kg (Calculated) : 73  Vital Signs: Temp: 98.7 F (37.1 C) (12/16 0448) Temp Source: Oral (12/16 0448) BP: 103/58 (12/16 0448) Pulse Rate: 73 (12/16 0448)  Labs: Recent Labs    09/09/21 0357 09/10/21 0307 09/11/21 0413  HGB 10.4*  --  10.2*  HCT 32.4*  --  31.7*  PLT 328  --  317  LABPROT 22.7* 25.1* 26.4*  INR 2.0* 2.3* 2.4*     Estimated Creatinine Clearance: 174 mL/min (A) (by C-G formula based on SCr of 0.58 mg/dL (L)).  Assessment: Anticoag: Mechanical Aortic valve/. warf/lov150q12h Hgb 10.4 stable, INR up to 2.4 PTA - 10mg  qday  - Ensure has 25% total daily value of Vitamin K - pt taking 2 a day-d/c'd 12/15. Watch for INR response to less Vit K intake. - Pt mentions that he is eating about ~40% more vegetables than outside the hospital  Goal of Therapy:  INR 2.5 (goal 2-3) Monitor platelets by anticoagulation protocol: Yes   Plan:  Lovenox 150mg  sq q12 hrs until INR>2.5. Hopefully can d/c tomorrow. Repeat Warf 25 mg x 1 Daily INR   Alainah Phang S. , PharmD, BCPS Clinical Staff Pharmacist [email or web address]  1/16, Nyela Cortinas Stillinger 09/11/2021,8:21 AM

## 2021-09-11 NOTE — Progress Notes (Signed)
Inpatient Rehabilitation Care Coordinator Discharge Note   Patient Details  Name: Adam Long MRN: [id number] Date of Birth: 22-Oct-1975   Discharge location: HOME WITH HIS UNCLE WHO CAN PROVIDE 24/7 SUPERVISION  Length of Stay:  12 DAYS  Discharge activity level: MOD/I WITH ASSISTIVE DEVICE  Home/community participation: ACTIVE  Patient response XA:JOINOM Literacy - How often do you need to have someone help you when you read instructions, pamphlets, or other written material from your doctor or pharmacy?: Never  Patient response VE:HMCNOB Isolation - How often do you feel lonely or isolated from those around you?: Never  Services provided included: MD, RD, PT, OT, RN, CM, Pharmacy, SW  Financial Services:  Financial Services Utilized: Private Insurance [employer]  Choices offered to/list presented to: PT  Follow-up services arranged:  Home Health, DME, Patient/Family has no preference for HH/DME agencies Home Health Agency: [facility] COULD NOT [place] RN AND PT DUE TO INSURANCE ENDING AT [place]    DME : [facility] BEDSIDE COMMODE AND WHEELCHAIR PT TO GET [place]    Patient response to transportation need: Is the patient able to respond to transportation needs?: Yes In the past 12 months, has lack of transportation kept you from medical appointments or from getting medications?: No In the past 12 months, has lack of transportation kept you from meetings, work, or from getting things needed for daily living?: No    Comments (or additional information):PT DID WELL AND MET HIS GOALS. HE IS AWARE HIS INSURANCE IS ENDING AND HE PLANS ON APPLYING FOR SSD AND MEDICAID UNTIL CAN GET BACK TO WORK.   Patient/Family verbalized understanding of follow-up arrangements:  Yes  Individual responsible for coordination of the follow-up  plan: SELF AND UNCLE [phone]  Confirmed correct DME delivered: Elease Hashimoto 09/11/2021    Reginia Battie, Gardiner Rhyme

## 2021-09-11 NOTE — Progress Notes (Signed)
Patient Education/ Patient teaching on Diabetic Foot care  This nurse educated this patient on diabetic foot care.  Patient taught back diabetic foot care regimen as follows: Check feet daily, check/shake  shoes daily Wash foot with luke warm water, rinse clean, dry feet completely. Dry in between toes because of the risk for maceration. Apply lotion to foot but not in between the toes. Wear diabetic socks, diabetic shoes/shoes with good support. Get a yearly foot exam/ follow up with podiatrist.  Patient understand once he discharges to follow diabetic regimen per md order, keep all md appointments. If he has any changes to his skin notify the MD.

## 2021-09-11 NOTE — Progress Notes (Signed)
Physical Therapy Session Note  Patient Details  Name: Adam Long MRN: [id number] Date of Birth: 1976-01-14  Today's Date: 09/11/2021 PT Individual Time: 0915-1015, 1300-1430 PT Individual Time Calculation (min): 60 min, 90 min   Short Term Goals: Week 1:  PT Short Term Goal 1 (Week 1): =LTGs d/t ELOS Week 2:  PT Short Term Goal 1 (Week 2): =LTGs d/t ELOS  Skilled Therapeutic Interventions/Progress Updates:    Session 1: Pt in bed on arrival and agreeable to therapy. Pt requested to use urinal while seated EOB. Supine>sit with supervision. At this time, pt notes that he was not properly cleaned after BM the previous evening. Pt stated that a NT insisted on assisting with hygiene even though pt is able to perform this task independently. Pt required assist at this time because so much had been left the previous day that it had smeared down pt's leg. Pt is not incontinent and did not notice until moving around to get out of bed this AM. After cleaning up, pt donned shoe and limb guard independently and performed ambulatory transfer to w/c with distant supervision.   Rest of session focused on NMR to improve standing balance for transfers and stair navigation. Pt utilized body blade for perturbations fading to fingertip support from other UE, 2 x 2 min BIL. Pt then performed 2 bouts of reaching outside BOS while standing on airex pad, alternating UE. Pt then returned to room and to recliner with supervision Stand pivot transfer and was left with all needs in reach and alarm active.   Session 2: Pt received in recliner and agreeable to therapy.  Mod I transfer to w/c with RW. Pt propelled w/c with BUE to WCC entrance mod I, including navigating elevator, for endurance and functional mobility as well as community reintegration and holistic therapeutic benefit of sunlight. Pt then practiced moving RW to prepare for curb management. Pt navigated curb in community environment x 3 with CGA, VC  throughout for safety and technique. Pt propelled w/c uphill x ~89ft UE endurance and strength. Pt transported back to room for time management as IV ABX due at this time. Pt provided with and educated on HEP during this time and performed those exercises which pt was not already familiar, as noted below. Pt was then left in recliner with all needs in reach.   Access Code: [email or web address] URL: [email or web address] Date: 09/11/2021 Prepared by: Bernie Covey  Exercises Prone Lying with Towel Roll (Hip Flexor Stretch) (BKA) - 1 x daily - 7 x weekly - 4 reps - 30 hold Prone Hip Extension with Residual Limb (BKA) - 1 x daily - 7 x weekly - 3 sets - 10 reps Rainbows (Mirrored) - 1 x daily - 7 x weekly - 4 sets - 12 reps Prone Hip Extension with Residual Limb (BKA) (Mirrored) - 1 x daily - 7 x weekly - 3 sets - 10 reps Sidelying Hip Abduction (BKA) - 1 x daily - 7 x weekly - 3 sets - 10 reps Rainbows - 1 x daily - 7 x weekly - 3 sets - 10 reps Prone Hip Extension - Non Involved Leg Bent (BKA) - 1 x daily - 7 x weekly - 3 sets - 10 reps Sidelying HIp Circles (BKA) - 1 x daily - 7 x weekly - 3 sets - 10 reps Prone Hip Extension with Residual Limb (BKA) (Mirrored) - 1 x daily - 7 x weekly - 3 sets - 10 reps Prone Hip Extension - Non  Involved Leg Bent (BKA) (Mirrored) - 1 x daily - 7 x weekly - 3 sets - 10 reps Supine Single Leg Bridge with Sound Leg (BKA) - 1 x daily - 7 x weekly - 4 sets - 6 reps Standing Glut Med Strengthening on Prosthetic Side (BKA) - 1 x daily - 7 x weekly - 3 sets - 10 reps Seated Knee Extension (BKA) - 1 x daily - 7 x weekly - 3 sets - 10 reps Squat with Chair Touch - 1 x daily - 7 x weekly - 4 sets - 8 reps Standing Hip Extension with Counter Support - 1 x daily - 7 x weekly - 3 sets - 10 reps Standing Hip Abduction with Counter Support - 1 x daily - 7 x weekly - 3 sets - 10 reps Heel Raises with Counter Support - 1 x daily - 7 x weekly - 3 sets - 10 reps Standing  March with Unilateral Counter Support - 1 x daily - 7 x weekly - 3 sets - 10 reps Single Leg Balance with Hip Flexion - 1 x daily - 7 x weekly - 3 sets - 10 reps Standing 3-way Hip with Walker - 1 x daily - 7 x weekly - 3 sets - 10 reps   Therapy Documentation Precautions:  Precautions Precautions: Fall Required Braces or Orthoses: Other Brace Other Brace: limb protector Restrictions Weight Bearing Restrictions: Yes LLE Weight Bearing: Non weight bearing Other Position/Activity Restrictions: L limb protector     Therapy/Group: Individual Therapy  Juluis Rainier 09/11/2021, 4:45 PM

## 2021-09-11 NOTE — Progress Notes (Signed)
PROGRESS NOTE   Subjective/Complaints:  No new complaints this morning Discussed oxycodone regimen and he is willing to decrease to 72m He feels ready for d/c on Monday    ROS:  Pt denies SOB, abd pain, CP, N/V/C/D, and vision changes, +phantom limb pain    Objective:   No results found. Recent Labs    09/09/21 0357 09/11/21 0413  WBC 9.7 8.2  HGB 10.4* 10.2*  HCT 32.4* 31.7*  PLT 328 317   No results for input(s): NA, K, CL, CO2, GLUCOSE, BUN, CREATININE, CALCIUM in the last 72 hours.   Intake/Output Summary (Last 24 hours) at 09/11/2021 1002 Last data filed at 09/11/2021 0806 Gross per 24 hour  Intake 980 ml  Output 950 ml  Net 30 ml        Physical Exam: Vital Signs Blood pressure (!) 103/58, pulse 73, temperature 98.7 F (37.1 C), temperature source Oral, resp. rate 18, height 5' 10" (1.778 m), weight (!) 154.3 kg, SpO2 100 %. General: awake, alert, appropriate, sitting up in bed; looks exhausted; BMI 50; NAD HENT: conjugate gaze; oropharynx moist CV: regular rate; no JVD Abdomen: significant abdominal obesity Pulmonary: CTA B/L; no W/R/R- good air movement GI: soft, NT, ND, (+)BS; protuberant Psychiatric: appropriate Neurological: Ox3  Skin: L BKA with limp protector in place   Assessment/Plan: 1. Functional deficits which require 3+ hours per day of interdisciplinary therapy in a comprehensive inpatient rehab setting. Physiatrist is providing close team supervision and 24 hour management of active medical problems listed below. Physiatrist and rehab team continue to assess barriers to discharge/monitor patient progress toward functional and medical goals  Care Tool:  Bathing    Body parts bathed by patient: Right arm, Left arm, Chest, Abdomen, Front perineal area, Right upper leg, Left upper leg, Face, Right lower leg, Buttocks   Body parts bathed by helper: Buttocks Body parts n/a:  Left lower leg   Bathing assist Assist Level: Supervision/Verbal cueing     Upper Body Dressing/Undressing Upper body dressing   What is the patient wearing?: Pull over shirt    Upper body assist Assist Level: Set up assist    Lower Body Dressing/Undressing Lower body dressing      What is the patient wearing?: Underwear/pull up, Pants     Lower body assist Assist for lower body dressing: Supervision/Verbal cueing     Toileting Toileting    Toileting assist Assist for toileting: Supervision/Verbal cueing Assistive Device Comment: Rolling walker   Transfers Chair/bed transfer  Transfers assist     Chair/bed transfer assist level: Supervision/Verbal cueing Chair/bed transfer assistive device: WProgrammer, multimedia  Ambulation assist      Assist level: Contact Guard/Touching assist Assistive device: Walker-rolling Max distance: 30 ft   Walk 10 feet activity   Assist     Assist level: Contact Guard/Touching assist Assistive device: Walker-rolling   Walk 50 feet activity   Assist Walk 50 feet with 2 turns activity did not occur: Safety/medical concerns         Walk 150 feet activity   Assist Walk 150 feet activity did not occur: Safety/medical concerns  Walk 10 feet on uneven surface  activity   Assist Walk 10 feet on uneven surfaces activity did not occur: Safety/medical concerns         Wheelchair     Assist Is the patient using a wheelchair?: Yes Type of Wheelchair: Manual    Wheelchair assist level: Supervision/Verbal cueing Max wheelchair distance: 80 ft    Wheelchair 50 feet with 2 turns activity    Assist        Assist Level: Supervision/Verbal cueing   Wheelchair 150 feet activity     Assist      Assist Level: Minimal Assistance - Patient > 75%   Blood pressure (!) 103/58, pulse 73, temperature 98.7 F (37.1 C), temperature source Oral, resp. rate 18, height 5' 10" (1.778 m),  weight (!) 154.3 kg, SpO2 100 %.  Medical Problem List and Plan: 1. Functional deficits secondary to L BKA             -patient may shower but incision must be covered             -ELOS/Goals: 10-14 days S  12/14- pt agreed to stay til 12/19 this AM due to IV ABX- continue CIR- PT and OT- work on higher level function.  2.  Mechanical Aortic valve/Antithrombotics: -DVT/anticoagulation:  Pharmaceutical: Coumadin with lovenox bridge till INR>2.5. Hopefully can d/c lovenox tomorrow as per pharmacy. Appreciate pharmacy note- will d/c ensure given Vitamin K content             -antiplatelet therapy: N/A 3. Residual limb pain: decrease oxycodone to 67m q4H prn. Encouraged to only use as needed.   12/14- will increase gabapentin to 600 mg BID- and increase as needed for nerve pain 4. Mood: LCSW to follow for evaluation and support.              -antipsychotic agents: N/A 5. Neuropsych: This patient is capable of making decisions on his own behalf. 6. Skin/Wound Care: Routine pressure relief measures.              --wound VAC come off POD# 7 on 12/09  12/8- MASD in groin- WOC started silver antimicrobial wicking fabric  12/9- wound VAC is off- con't dressings- [place]- with his BMI of 50, will have a harder time with BKA/prosthesis.   12/13- BKA looking better/has retention sutures and staples- explained will be 3-4 minimum before removed by Surgeon.  7. Fluids/Electrolytes/Nutrition: Monitor I/O. Check CMET in am.  8. Sepsis secondary to MSSA bacteremia: continue IV cefazolin with end date 09/24/21.  12/13- ESR 65 down from 109- CRP 3.4- down from 12.8- so doing much better- con't weekly labs.              --weekly labs 9. T2DM: Uncontrolled with A1C-7.8. Monitor BS ac/hs and use SSI for elevated BS             --start metformin 2516mdaily             --continue insulin glargine 15 units (was on 30 units PTA) with SSI for elevated BS.    12/12-  CBGs controlled- con't regimen 10. HTN: Monitor BP TID--has been running low             --Lisinopril 10 mg with metoprolol bid. Monitor for orthostatic changes.   12/10- BP well controlled- con't regimen 11. Super morbid obesity: BMI 54.5. will consult dietician to educate on diet --Educate on weight loss and dietary  changes to help promote overall health and mobility.   12/9- BMI down to 50- con't education 12/12- BMI stable- con't decrease in intake 12. Hyponatremia: Recheck lytes in am. 12/8- Na 128- will recheck in AM- if still <130, will put on fluid restriction  12/9- Na back up to 133- will recheck weekly and prn.  13. Malnutrition: Albumin has dropped from 3.2-->2.7 today.             --add protein supplement TID.  12/13- will change BMP to CMP so can follow labs.  14. Leucocytosis:Has been fluctuating 15.3-->12.3-->14.3  12/8- WBC down to 10.7- so doing much better- con't to monitor  12/12- WBC down to 9.9- con't to monitor --Monitor for fevers and trends.   15. Nerve/phantom pain  12/14- will increase gabapentin to 600 mg BID- can increase while I'm gone as needed  12/15: appears to have helped, continue current regimen   LOS: 9 days A FACE TO [place] 09/11/2021, 10:02 AM

## 2021-09-12 LAB — GLUCOSE, CAPILLARY
Glucose-Capillary: 132 mg/dL — ABNORMAL HIGH (ref 70–99)
Glucose-Capillary: 128 mg/dL — ABNORMAL HIGH (ref 70–99)
Glucose-Capillary: 128 mg/dL — ABNORMAL HIGH (ref 70–99)
Glucose-Capillary: 155 mg/dL — ABNORMAL HIGH (ref 70–99)

## 2021-09-12 LAB — PROTIME-INR
INR: 2.8 — ABNORMAL HIGH (ref 0.8–1.2)
Prothrombin Time: 29.7 seconds — ABNORMAL HIGH (ref 11.4–15.2)

## 2021-09-12 MED ORDER — WARFARIN SODIUM 7.5 MG PO TABS
23.0000 mg | ORAL_TABLET | Freq: Once | ORAL | Status: AC
  Administered 2021-09-12: 23 mg via ORAL
  Filled 2021-09-12: qty 1

## 2021-09-12 NOTE — Progress Notes (Signed)
ANTICOAGULATION CONSULT NOTE - Follow Up Consult  Pharmacy Consult for Warfarin Indication: mechanical AVR  Allergies  Allergen Reactions   Pollen Extract Other (See Comments)    Runny nose, watery eyes, sneezing    Patient Measurements: Height: 5\' 10"  (177.8 cm) Weight: (!) 148.5 kg (327 lb 6.1 oz) IBW/kg (Calculated) : 73  Vital Signs: Temp: 98.5 F (36.9 C) (12/17 0540) Temp Source: Oral (12/17 0540) BP: 113/74 (12/17 0540) Pulse Rate: 80 (12/17 0540)  Labs: Recent Labs    09/10/21 0307 09/11/21 0413 09/12/21 0519  HGB  --  10.2*  --   HCT  --  31.7*  --   PLT  --  317  --   LABPROT 25.1* 26.4* 29.7*  INR 2.3* 2.4* 2.8*    Estimated Creatinine Clearance: 170.2 mL/min (A) (by C-G formula based on SCr of 0.58 mg/dL (L)).   Medications:  Scheduled:   (feeding supplement) PROSource Plus  30 mL Oral BID BM   atorvastatin  80 mg Oral Daily   Chlorhexidine Gluconate Cloth  6 each Topical Daily   gabapentin  600 mg Oral BID   insulin aspart  0-5 Units Subcutaneous QHS   insulin aspart  0-9 Units Subcutaneous TID WC   lisinopril  10 mg Oral Daily   metFORMIN  250 mg Oral Q breakfast   metoprolol tartrate  25 mg Oral BID   pantoprazole  40 mg Oral Daily   sertraline  50 mg Oral Daily   sodium chloride flush  10-40 mL Intracatheter Q12H   Warfarin - Pharmacist Dosing Inpatient   Does not apply q1600   Infusions:    ceFAZolin (ANCEF) IV Stopped (09/12/21 0630)    Assessment: 45 yo M with Mechanical Aortic valve, on Warfarin and Lovenox 150mg  q12h. Plans noted to discontinue Lovenox when INR > 2.5.   Hgb 10.2 stable, INR up to 2.8 PTA warfarin dose - 10mg  qday   - Ensure has 25% total daily value of Vitamin K - pt taking 2 a day-d/c'd 12/15. Watch for INR response to less Vit K intake. - Pt mentions that he is eating about ~40% more vegetables than outside the hospital  Goal of Therapy:  INR 2.5-3.0 Monitor platelets by anticoagulation protocol: Yes    Plan:  Discontinue Lovenox Warfarin 23 mg x 1 Daily INR   , PharmD PGY1 Pharmacy Resident Phone [phone] 09/12/2021 9:26 AM   Please check AMION for all [facility] Pharmacy phone numbers After 10:00 PM, call Main Pharmacy [phone]

## 2021-09-12 NOTE — Progress Notes (Signed)
PROGRESS NOTE   Subjective/Complaints: Phantom pain improving  No other c/o    ROS:  Pt denies SOB, abd pain, CP, N/V/C/D, and vision changes, +phantom limb pain    Objective:   No results found. Recent Labs    09/11/21 0413  WBC 8.2  HGB 10.2*  HCT 31.7*  PLT 317    No results for input(s): NA, K, CL, CO2, GLUCOSE, BUN, CREATININE, CALCIUM in the last 72 hours.   Intake/Output Summary (Last 24 hours) at 09/12/2021 0750 Last data filed at 09/12/2021 2025 Gross per 24 hour  Intake 1400 ml  Output 2100 ml  Net -700 ml         Physical Exam: Vital Signs Blood pressure 113/74, pulse 80, temperature 98.5 F (36.9 C), temperature source Oral, resp. rate 17, height 5' 10" (1.778 m), weight (!) 148.5 kg, SpO2 98 %.  General: No acute distress Mood and affect are appropriate Heart: Regular rate and rhythm no rubs murmurs , + valve click  Lungs: Clear to auscultation, breathing unlabored, no rales or wheezes Abdomen: Positive bowel sounds, soft nontender to palpation, nondistended Extremities: No clubbing, cyanosis, or edema Chest healed sternotomy incision    Skin: L BKA with limp protector in place   Assessment/Plan: 1. Functional deficits which require 3+ hours per day of interdisciplinary therapy in a comprehensive inpatient rehab setting. Physiatrist is providing close team supervision and 24 hour management of active medical problems listed below. Physiatrist and rehab team continue to assess barriers to discharge/monitor patient progress toward functional and medical goals  Care Tool:  Bathing    Body parts bathed by patient: Right arm, Left arm, Chest, Abdomen, Front perineal area, Right upper leg, Left upper leg, Face, Right lower leg, Buttocks   Body parts bathed by helper: Buttocks Body parts n/a: Left lower leg   Bathing assist Assist Level: Supervision/Verbal cueing     Upper Body  Dressing/Undressing Upper body dressing   What is the patient wearing?: Pull over shirt    Upper body assist Assist Level: Set up assist    Lower Body Dressing/Undressing Lower body dressing      What is the patient wearing?: Underwear/pull up, Pants     Lower body assist Assist for lower body dressing: Supervision/Verbal cueing     Toileting Toileting    Toileting assist Assist for toileting: Supervision/Verbal cueing Assistive Device Comment: Rolling walker   Transfers Chair/bed transfer  Transfers assist     Chair/bed transfer assist level: Independent with assistive device Chair/bed transfer assistive device: Programmer, multimedia   Ambulation assist      Assist level: Independent with assistive device Assistive device: Walker-rolling Max distance: 30 ft   Walk 10 feet activity   Assist     Assist level: Independent with assistive device Assistive device: Walker-rolling   Walk 50 feet activity   Assist Walk 50 feet with 2 turns activity did not occur: Safety/medical concerns         Walk 150 feet activity   Assist Walk 150 feet activity did not occur: Safety/medical concerns         Walk 10 feet on uneven surface  activity   Assist Walk 10 feet on uneven surfaces activity did not occur: Safety/medical concerns         Wheelchair     Assist Is the patient using a wheelchair?: Yes Type of Wheelchair: Manual    Wheelchair assist level: Independent Max wheelchair distance: 300+    Wheelchair 50 feet with 2 turns activity    Assist        Assist Level: Independent   Wheelchair 150 feet activity     Assist      Assist Level: Independent   Blood pressure 113/74, pulse 80, temperature 98.5 F (36.9 C), temperature source Oral, resp. rate 17, height 5' 10" (1.778 m), weight (!) 148.5 kg, SpO2 98 %.  Medical Problem List and Plan: 1. Functional deficits secondary to L BKA             -patient  may shower but incision must be covered             -ELOS/Goals: 10-14 days S  12/14- pt agreed to stay til 12/19 this AM due to IV ABX- continue CIR- PT and OT- work on higher level function.  2.  Mechanical Aortic valve/Antithrombotics: -DVT/anticoagulation:  Pharmaceutical: Coumadin with lovenox bridge till INR>2.5. Hopefully can d/c lovenox tomorrow as per pharmacy. Appreciate pharmacy note- will d/c ensure given Vitamin K content             -antiplatelet therapy: N/A 3. Residual limb pain: decrease oxycodone to 59m q4H prn. Encouraged to only use as needed.   12/14- will increase gabapentin to 600 mg BID- and increase as needed for nerve pain 4. Mood: LCSW to follow for evaluation and support.              -antipsychotic agents: N/A 5. Neuropsych: This patient is capable of making decisions on his own behalf. 6. Skin/Wound Care: Routine pressure relief measures.              --wound VAC come off POD# 7 on 12/09  12/8- MASD in groin- WOC started silver antimicrobial wicking fabric  12/9- wound VAC is off- con't dressings- [place]- with his BMI of 50, will have a harder time with BKA/prosthesis.   12/13- BKA looking better/has retention sutures and staples- explained will be 3-4 minimum before removed by Surgeon.  7. Fluids/Electrolytes/Nutrition: Monitor I/O. Check CMET in am.  8. Sepsis secondary to MSSA bacteremia: continue IV cefazolin with end date 09/24/21.  12/13- ESR 65 down from 109- CRP 3.4- down from 12.8- so doing much better- con't weekly labs.              --weekly labs 9. T2DM: Uncontrolled with A1C-7.8. Monitor BS ac/hs and use SSI for elevated BS             --start metformin 2550mdaily             --continue insulin glargine 15 units (was on 30 units PTA) with SSI for elevated BS.    12/12- CBGs controlled- con't regimen 10. HTN: Monitor BP TID--has been running low             --Lisinopril 10 mg with metoprolol bid.  Monitor for orthostatic changes.   12/10- BP well controlled- con't regimen 11. Super morbid obesity: BMI 54.5. will consult dietician to educate on diet --Educate on weight loss and dietary changes to help promote overall health and mobility.   12/9- BMI down to 50- con't  education 12/12- BMI stable- con't decrease in intake 12. Hyponatremia: Recheck lytes in am. 12/8- Na 128- will recheck in AM- if still <130, will put on fluid restriction  12/9- Na back up to 133- will recheck weekly and prn.  13. Malnutrition: Albumin has dropped from 3.2-->2.7 today.             --add protein supplement TID.  12/13- will change BMP to CMP so can follow labs.  14. Leucocytosis:Has been fluctuating 15.3-->12.3-->14.3  12/8- WBC down to 10.7- so doing much better- con't to monitor  12/12- WBC down to 9.9- con't to monitor --Monitor for fevers and trends.   15. Nerve/phantom pain  12/14- will increase gabapentin to 600 mg BID-  12/16: appears to have helped, continue current regimen   LOS: 10 days A FACE TO [place] E Kirsteins 09/12/2021, 7:50 AM

## 2021-09-12 NOTE — Progress Notes (Signed)
Occupational Therapy Session Note  Patient Details  Name: Adam Long MRN: [id number] Date of Birth: 24-Feb-1976  Today's Date: 09/13/2021 OT Individual Time: 1400-1500 OT Individual Time Calculation (min): 60 min   Short Term Goals: Week 1:  OT Short Term Goal 1 (Week 1): STGs = LTGs OT Short Term Goal 1 - Progress (Week 1): Progressing toward goal  Skilled Therapeutic Interventions/Progress Updates:    Pt greeted in the w/c, requesting to use the restroom and then to engage in ADL routine. Pt used the RW at ambulatory level with Mod I to the elevated toilet. Pt with BM void, able to complete hygiene unassisted. Pt then returned to his w/c and set up bathing/dressing for himself using items from personal bags and closet. Sit<stand for perihygiene with pt doing well to remember to lock his w/c brakes before power up. Reminded him to use the [facility] mirror for daily residual limb inspection. Mod I for shaving and changing shrinker socks. He transferred to the recliner at close of session, left in care of RN for antibiotic medicine.   Therapy Documentation Precautions:  Precautions Precautions: Fall Precaution Comments: limb guard Required Braces or Orthoses: Other Brace Other Brace: limb protector Restrictions Weight Bearing Restrictions: Yes LLE Weight Bearing: Non weight bearing Other Position/Activity Restrictions: L limb protector Pain: no c/o pain during tx   ADL: ADL Eating: Independent Grooming: Modified independent Where Assessed-Grooming: Sitting at sink Upper Body Bathing: Setup Where Assessed-Upper Body Bathing: Sitting at sink Lower Body Bathing: Setup Where Assessed-Lower Body Bathing: Sitting at sink, Standing at sink Upper Body Dressing: Modified independent (Device) Lower Body Dressing: Modified independent Where Assessed-Lower Body Dressing: Edge of bed Toileting: Modified independent Toilet Transfer: Modified independent Film/video editor: Not  assessed ADL Comments: pt has politely declined to attempt shower transfers throughout CIR stay, he wants to defer shower until his limb has completely healed   Therapy/Group: Individual Therapy  Tykeisha Peer A [name] 09/13/2021, 4:25 PM

## 2021-09-13 LAB — GLUCOSE, CAPILLARY
Glucose-Capillary: 134 mg/dL — ABNORMAL HIGH (ref 70–99)
Glucose-Capillary: 100 mg/dL — ABNORMAL HIGH (ref 70–99)
Glucose-Capillary: 118 mg/dL — ABNORMAL HIGH (ref 70–99)
Glucose-Capillary: 138 mg/dL — ABNORMAL HIGH (ref 70–99)

## 2021-09-13 LAB — PROTIME-INR
INR: 2.9 — ABNORMAL HIGH (ref 0.8–1.2)
Prothrombin Time: 30.4 seconds — ABNORMAL HIGH (ref 11.4–15.2)

## 2021-09-13 MED ORDER — WARFARIN SODIUM 7.5 MG PO TABS
23.0000 mg | ORAL_TABLET | Freq: Once | ORAL | Status: AC
  Administered 2021-09-13: 16:00:00 23 mg via ORAL
  Filled 2021-09-13: qty 1

## 2021-09-13 NOTE — Progress Notes (Signed)
ANTICOAGULATION CONSULT NOTE - Follow Up Consult  Pharmacy Consult for Warfarin Indication: mechanical AVR  Allergies  Allergen Reactions   Pollen Extract Other (See Comments)    Runny nose, watery eyes, sneezing    Patient Measurements: Height: 5\' 10"  (177.8 cm) Weight: (!) 152.2 kg (335 lb 8.6 oz) IBW/kg (Calculated) : 73  Vital Signs: Temp: 98.6 F (37 C) (12/18 0516) Temp Source: Oral (12/17 1946) BP: 110/66 (12/18 0516) Pulse Rate: 67 (12/18 0516)  Labs: Recent Labs    09/11/21 0413 09/12/21 0519 09/13/21 0421  HGB 10.2*  --   --   HCT 31.7*  --   --   PLT 317  --   --   LABPROT 26.4* 29.7* 30.4*  INR 2.4* 2.8* 2.9*    Estimated Creatinine Clearance: 172.7 mL/min (A) (by C-G formula based on SCr of 0.58 mg/dL (L)).   Medications:  Scheduled:   (feeding supplement) PROSource Plus  30 mL Oral BID BM   atorvastatin  80 mg Oral Daily   Chlorhexidine Gluconate Cloth  6 each Topical Daily   gabapentin  600 mg Oral BID   insulin aspart  0-5 Units Subcutaneous QHS   insulin aspart  0-9 Units Subcutaneous TID WC   lisinopril  10 mg Oral Daily   metFORMIN  250 mg Oral Q breakfast   metoprolol tartrate  25 mg Oral BID   pantoprazole  40 mg Oral Daily   sertraline  50 mg Oral Daily   sodium chloride flush  10-40 mL Intracatheter Q12H   Warfarin - Pharmacist Dosing Inpatient   Does not apply q1600   Infusions:    ceFAZolin (ANCEF) IV Stopped (09/13/21 09/15/21)    Assessment: 45 yo M with Mechanical Aortic valve, on Warfarin. Pharmacy consulted for Warfarin dosing.   Hgb 10.2 stable, INR up to 2.9 PTA warfarin dose - 10mg  qday   - Ensure has 25% total daily value of Vitamin K - pt taking 2 a day-d/c'd 12/15. Watch for INR response to less Vit K intake. - Pt mentions that he is eating about ~40% more vegetables than outside the hospital.  Goal of Therapy:  INR 2.5-3.0 Monitor platelets by anticoagulation protocol: Yes   Plan:  Warfarin 23 mg x 1 Daily  INR   , PharmD PGY1 Pharmacy Resident Phone [phone] 09/13/2021 7:31 AM   Please check AMION for all [facility] Pharmacy phone numbers After 10:00 PM, call Main Pharmacy [phone]

## 2021-09-13 NOTE — Progress Notes (Signed)
This nurse educated this patient on signs and symptoms of hyperglycemia and hypoglycemia. Patient was able to provide verbal teach-back with no issues.

## 2021-09-13 NOTE — Plan of Care (Signed)
Problem: RH Balance Goal: LTG Patient will maintain dynamic standing with ADLs (OT) Description: LTG:  Patient will maintain dynamic standing balance with assist during activities of daily living (OT)  Outcome: Completed/Met   Problem: Sit to Stand Goal: LTG:  Patient will perform sit to stand in prep for activites of daily living with assistance level (OT) Description: LTG:  Patient will perform sit to stand in prep for activites of daily living with assistance level (OT) Outcome: Completed/Met   Problem: RH Bathing Goal: LTG Patient will bathe all body parts with assist levels (OT) Description: LTG: Patient will bathe all body parts with assist levels (OT) Outcome: Completed/Met   Problem: RH Dressing Goal: LTG Patient will perform lower body dressing w/assist (OT) Description: LTG: Patient will perform lower body dressing with assist, with/without cues in positioning using equipment (OT) Outcome: Completed/Met   Problem: RH Toileting Goal: LTG Patient will perform toileting task (3/3 steps) with assistance level (OT) Description: LTG: Patient will perform toileting task (3/3 steps) with assistance level (OT)  Outcome: Completed/Met   Problem: RH Toilet Transfers Goal: LTG Patient will perform toilet transfers w/assist (OT) Description: LTG: Patient will perform toilet transfers with assist, with/without cues using equipment (OT) Outcome: Completed/Met   Problem: RH Tub/Shower Transfers Goal: LTG Patient will perform tub/shower transfers w/assist (OT) Description: LTG: Patient will perform tub/shower transfers with assist, with/without cues using equipment (OT) Outcome: Incomplete/unable to be met

## 2021-09-13 NOTE — Plan of Care (Signed)
Problem: RH Tub/Shower Transfers Goal: LTG Patient will perform tub/shower transfers w/assist (OT) Description: LTG: Patient will perform tub/shower transfers with assist, with/without cues using equipment (OT) Outcome: Not Met (add Reason) Flowsheets (Taken 09/13/2021 1556) LTG: Pt will perform tub/shower stall transfers with assistance level of: Set up assist Note: Pt declined to attempt shower transfers as he has opted to not shower at home, will sponge bathe at d/c until his limb has completely healed

## 2021-09-13 NOTE — Progress Notes (Addendum)
Physical Therapy Session Note  Patient Details  Name: Adam Long MRN: [id number] Date of Birth: Aug 09, 1976  Today's Date: 09/13/2021 PT Individual Time: 1300-1355 PT Individual Time Calculation (min): 55 min   Short Term Goals: Week 2:  PT Short Term Goal 1 (Week 2): =LTGs d/t ELOS  Skilled Therapeutic Interventions/Progress Updates:    Pt received seated in bed, agreeable to PT session. No complaints of pain, does report he has been having intermittent phantom limb pain in LLE but that therapy has reviewed phantom pain management techniques with him. Bed mobility at independent. Pt is able to don R tennis shoe and L residual limb guard at bed level with setup A. Sit to stand and stand pivot transfers with RW at mod I level throughout session. Pt is at mod I level for w/c mobility up to 300 ft with use of BUE and is independent for management of w/c parts. Ambulation 2 x 30 ft with RW at mod I level, unable to ambulate further due to fatigue of RLE resulting in it no longer being safe to continue ambulation for longer distances. Ascend/descend 2 x 3" stairs with use of RW backwards with CGA for balance. Standing LLE therex with RW at mod I level: hip flexion, hip abduction, hip extension, HS curls, marches x 15 reps each. Pt returned to room and left seated up in w/c with needs in reach at end of session.  Therapy Documentation Precautions:  Precautions Precautions: Fall Precaution Comments: limb guard Required Braces or Orthoses: Other Brace Other Brace: limb protector Restrictions Weight Bearing Restrictions: Yes LLE Weight Bearing: Non weight bearing Other Position/Activity Restrictions: L limb protector    Therapy/Group: Individual Therapy  Peter Congo, PT, DPT, CSRS  09/13/2021, 2:29 PM

## 2021-09-14 LAB — CBC WITH DIFFERENTIAL/PLATELET
Abs Immature Granulocytes: 0.04 10*3/uL (ref 0.00–0.07)
Basophils Absolute: 0 10*3/uL (ref 0.0–0.1)
Basophils Relative: 1 %
Eosinophils Absolute: 0.2 10*3/uL (ref 0.0–0.5)
Eosinophils Relative: 3 %
HCT: 32.5 % — ABNORMAL LOW (ref 39.0–52.0)
Hemoglobin: 10.4 g/dL — ABNORMAL LOW (ref 13.0–17.0)
Immature Granulocytes: 1 %
Lymphocytes Relative: 30 %
Lymphs Abs: 2.2 10*3/uL (ref 0.7–4.0)
MCH: 27.2 pg (ref 26.0–34.0)
MCHC: 32 g/dL (ref 30.0–36.0)
MCV: 84.9 fL (ref 80.0–100.0)
Monocytes Absolute: 0.6 10*3/uL (ref 0.1–1.0)
Monocytes Relative: 8 %
Neutro Abs: 4.2 10*3/uL (ref 1.7–7.7)
Neutrophils Relative %: 57 %
Platelets: 297 10*3/uL (ref 150–400)
RBC: 3.83 MIL/uL — ABNORMAL LOW (ref 4.22–5.81)
RDW: 15.1 % (ref 11.5–15.5)
WBC: 7.2 10*3/uL (ref 4.0–10.5)
nRBC: 0 % (ref 0.0–0.2)

## 2021-09-14 LAB — COMPREHENSIVE METABOLIC PANEL
ALT: 25 U/L (ref 0–44)
AST: 24 U/L (ref 15–41)
Albumin: 2.6 g/dL — ABNORMAL LOW (ref 3.5–5.0)
Alkaline Phosphatase: 69 U/L (ref 38–126)
Anion gap: 5 (ref 5–15)
BUN: 14 mg/dL (ref 6–20)
CO2: 27 mmol/L (ref 22–32)
Calcium: 8.7 mg/dL — ABNORMAL LOW (ref 8.9–10.3)
Chloride: 100 mmol/L (ref 98–111)
Creatinine, Ser: 0.66 mg/dL (ref 0.61–1.24)
GFR, Estimated: >60 mL/min (ref >60)
Glucose, Bld: 132 mg/dL — ABNORMAL HIGH (ref 70–99)
Potassium: 4.1 mmol/L (ref 3.5–5.1)
Sodium: 132 mmol/L — ABNORMAL LOW (ref 135–145)
Total Bilirubin: 0.8 mg/dL (ref 0.3–1.2)
Total Protein: 7.2 g/dL (ref 6.5–8.1)

## 2021-09-14 LAB — GLUCOSE, CAPILLARY: Glucose-Capillary: 129 mg/dL — ABNORMAL HIGH (ref 70–99)

## 2021-09-14 LAB — PROTIME-INR
INR: 3.2 — ABNORMAL HIGH (ref 0.8–1.2)
Prothrombin Time: 32.7 seconds — ABNORMAL HIGH (ref 11.4–15.2)

## 2021-09-14 LAB — C-REACTIVE PROTEIN: CRP: 1.5 mg/dL — ABNORMAL HIGH (ref <1.0)

## 2021-09-14 LAB — SEDIMENTATION RATE: Sed Rate: 60 mm/hr — ABNORMAL HIGH (ref 0–16)

## 2021-09-14 MED ORDER — METOPROLOL TARTRATE 25 MG PO TABS: 25.0000 mg | ORAL_TABLET | Freq: Two times a day (BID) | ORAL | 0 refills | Status: DC

## 2021-09-14 MED ORDER — METFORMIN HCL 500 MG PO TABS: 250.0000 mg | ORAL_TABLET | Freq: Every day | ORAL | 0 refills | Status: DC

## 2021-09-14 MED ORDER — ACETAMINOPHEN 325 MG PO TABS: 325.0000 mg | ORAL_TABLET | ORAL | Status: DC | PRN

## 2021-09-14 MED ORDER — GABAPENTIN 300 MG PO CAPS: 600.0000 mg | ORAL_CAPSULE | Freq: Two times a day (BID) | ORAL | 1 refills | Status: DC

## 2021-09-14 MED ORDER — CEFAZOLIN IV (FOR PTA / DISCHARGE USE ONLY): 2.0000 g | Freq: Three times a day (TID) | INTRAVENOUS | 0 refills | Status: AC

## 2021-09-14 MED ORDER — PRO-STAT SUGAR FREE PO LIQD: 30.0000 mL | Freq: Three times a day (TID) | ORAL | 0 refills | Status: DC

## 2021-09-14 MED ORDER — WARFARIN SODIUM 2 MG PO TABS: 2.0000 mg | ORAL_TABLET | Freq: Every day | ORAL | 0 refills | Status: DC

## 2021-09-14 MED ORDER — LISINOPRIL 10 MG PO TABS: 10.0000 mg | ORAL_TABLET | Freq: Every day | ORAL | 0 refills | Status: DC

## 2021-09-14 MED ORDER — WARFARIN SODIUM 10 MG PO TABS: 20.0000 mg | ORAL_TABLET | Freq: Every day | ORAL | 0 refills | Status: DC

## 2021-09-14 MED ORDER — PANTOPRAZOLE SODIUM 40 MG PO TBEC: 40.0000 mg | DELAYED_RELEASE_TABLET | Freq: Every day | ORAL | 0 refills | Status: DC

## 2021-09-14 MED ORDER — OXYCODONE HCL 10 MG PO TABS: 10.0000 mg | ORAL_TABLET | Freq: Three times a day (TID) | ORAL | 0 refills | Status: DC | PRN

## 2021-09-14 MED ORDER — WARFARIN SODIUM 7.5 MG PO TABS: 20.0000 mg | ORAL_TABLET | Freq: Once | ORAL | Status: DC

## 2021-09-14 MED ORDER — SERTRALINE HCL 50 MG PO TABS: 50.0000 mg | ORAL_TABLET | Freq: Every day | ORAL | 0 refills | Status: DC

## 2021-09-14 NOTE — Discharge Summary (Signed)
Physician Discharge Summary  Patient ID: SYNCERE EBLE MRN: [id number] DOB/AGE: Sep 21, 1976 45 y.o.  Admit date: 09/02/2021 Discharge date: 09/14/2021  Discharge Diagnoses:  Principal Problem:   Below-knee amputation of left lower extremity ([place]) Active Problems:   HTN (hypertension)   Morbid obesity ([place])   Hyponatremia   Normocytic anemia   Severe protein-calorie malnutrition ([facility])   MSSA bacteremia   Discharged Condition: good  Significant Diagnostic Studies: N/A   Labs:  Basic Metabolic Panel: BMP Latest Ref Rng & Units 09/14/2021 09/07/2021 09/04/2021  Glucose 70 - 99 mg/dL 132(H) 132(H) 129(H)  BUN 6 - 20 mg/dL '14 13 16  ' Creatinine 0.61 - 1.24 mg/dL 0.66 0.58(L) 0.65  BUN/Creat Ratio 9 - 20 - - -  Sodium 135 - 145 mmol/L 132(L) 132(L) 133(L)  Potassium 3.5 - 5.1 mmol/L 4.1 4.2 4.1  Chloride 98 - 111 mmol/L 100 99 99  CO2 22 - 32 mmol/L '27 27 26  ' Calcium 8.9 - 10.3 mg/dL 8.7(L) 8.5(L) 8.5(L)     CBC: Recent Labs  Lab 09/09/21 0357 09/11/21 0413 09/14/21 0348  WBC 9.7 8.2 7.2  NEUTROABS 6.5 5.1 4.2  HGB 10.4* 10.2* 10.4*  HCT 32.4* 31.7* 32.5*  MCV 84.8 83.6 84.9  PLT 328 317 297    CBG: Recent Labs  Lab 09/13/21 0607 09/13/21 1128 09/13/21 1624 09/13/21 2136 09/14/21 0612  GLUCAP 134* 100* 118* 138* 129*    Protime:  Lab Results  Component Value Date   INR 3.2 (H) 09/14/2021   INR 2.9 (H) 09/13/2021   INR 2.8 (H) 09/12/2021     Brief HPI:   AVANTAE BITHER is a 45 y.o. male with history of T2DM, hypertension, mechanical AVR-on Coumadin, diabetic foot ulcer with gangrenous changes and subacute osteomyelitis s/p ray amputation 06/22 who was admitted on 08/27/2021 with 1 week history of increasing edema and erythema, progressive drainage and some chills with nausea.  He was transitioned to IV heparin as foot was not felt to be salvageable and started on broad-spectrum antibiotic.  He underwent BKA by Dr. Sharol Given on 12/02.  Blood cultures done  showing MSSA bacteremia which was felt to be secondary to foot infection and antibiotics narrowed to cefazolin with end date of 09/24/2021 per ID input.  Therapy was ongoing and patient was noted to have difficulty with  ADLs as well as decline in mobility.  CIR was recommended due to functional decline/   Hospital Course: [place] was admitted to rehab 09/02/2021 for inpatient therapies to consist of PT and OT at least three hours five days a week. Past admission physiatrist, therapy team and rehab RN have worked together to provide customized collaborative inpatient rehab. Blood pressures were monitored on TID basis and has been controlled.  He was maintained on cefazolin every 8 hours and is tolerating this without any side effects.  Serial check of BMET showed hyponatremia is stable at 132 and renal status is within normal limits.  ESR and CRP are trending down to 60 and 1.5 respectively.  Serial check of CBC shows white count and platelets to be within normal limits and anemia is stable.  His p.o. intake has been good and is continent of bowel and bladder.  Pharmacy has been assisting in managing Coumadin.  He was cross covered with Lovenox until INR therapeutic.  He has required increased doses of Coumadin up to 23 mg and was discharged on 20 mg with recommendations to start 22 mg daily.  Protime to be  rechecked at [facility] in [place] on 12/21.   Oxycodone was used to help manage postop pain.  He has had issues with phantom pain and gabapentin was added and titrated upwards with improvement in pain control and decreasing narcotic use.  He has been educated on tapering of narcotics.  His left BKA site is clean, dry and intact and incision is healing well.  He has appointment this week to follow-up with Dr. Sharol Given for post op check and input on staple removal.  Protein supplements due to malnutrition and to  promote wound healing.  His diabetes has been monitored with ac/hs CBG checks and SSI was use prn for  tighter BS control.  His blood sugars have been well on low-dose metformin and he was advised to monitor blood sugars AC at bedtime basis and follow-up with PCP for input on resumption of home meds if blood sugars start trending upwards.  His mood has been stable and he has made great gains during his stay.  He is currently modified independent and outpatient therapy recommended once patient to be fitted with prosthesis.   Rehab course: During patient's stay in rehab weekly team conferences were held to monitor patient's progress, set goals and discuss barriers to discharge. At admission, patient required min assist with mobility and with ADL tasks. He  has had improvement in activity tolerance, balance, postural control as well as ability to compensate for deficits. He is able to complete ADL tasks at modified independent level. He is modified independent for transfers and is able to ambulate 30 feet x 2 with rolling walker at modified independent level.  Distances limited by fatigue.  He is able to ascend/descend 2 stairs with use of rolling walker and contact-guard assist.  He has been educated on home exercise program.   Discharge disposition: 01-Home or Self Care  Diet: Carb Modified.   Special Instructions: Monitor BS ac/hs  and follow up with PCP for further adjustment. Will need repeat CBC and BMET in 1-2 weeks to follow up on anemia and sodium levels.   Discharge Instructions     [facility] pharmacist to adjust dose for Vancomycin, Aminoglycosides and other anti-infective therapies as requested by physician.   Complete by: As directed    [facility] to provide Cath Flo 34m   Complete by: As directed    Administer for PICC line occlusion and as ordered by physician for other access device issues.   Ambulatory referral to Physical Medicine Rehab   Complete by: As directed    Hospital follow up   Anaphylaxis Kit: Provided to treat any anaphylactic reaction to the  medication being provided to the patient if First Dose or when requested by physician   Complete by: As directed    Epinephrine 167mml vial / amp: Administer 0.71m15m0.71ml371mubcutaneously once for moderate to severe anaphylaxis, nurse to call physician and pharmacy when reaction occurs and call 911 if needed for immediate care   Diphenhydramine 50mg80mIV vial: Administer 25-50mg 71mM PRN for first dose reaction, rash, itching, mild reaction, nurse to call physician and pharmacy when reaction occurs   Sodium Chloride 0.9% NS 500ml I51mdminister if needed for hypovolemic blood pressure drop or as ordered by physician after call to physician with anaphylactic reaction   Change dressing on IV access line weekly and PRN   Complete by: As directed    Flush IV access with Sodium Chloride 0.9% and Heparin 10 units/ml or 100 units/ml   Complete by:  As directed    Home infusion instructions - [facility]   Complete by: As directed    Instructions: Flush IV access with Sodium Chloride 0.9% and Heparin 10units/ml or 100units/ml   Change dressing on IV access line: Weekly and PRN   Instructions Cath Flo 53m: Administer for PICC Line occlusion and as ordered by physician for other access device   [facility] pharmacist to adjust dose for: Vancomycin, Aminoglycosides and other anti-infective therapies as requested by physician   Method of administration may be changed at the discretion of home infusion pharmacist based upon assessment of the patient and/or caregivers ability to self-administer the medication ordered   Complete by: As directed    Outpatient Parenteral Antibiotic Therapy Information Antibiotic: Cefazolin (Ancef) IVPB; Indications for use: bacteremia; End Date: 09/25/2021   Complete by: As directed    Antibiotic: Cefazolin (Ancef) IVPB   Indications for use: bacteremia   End Date: 09/25/2021      Allergies as of 09/14/2021       Reactions   Pollen Extract Other  (See Comments)   Runny nose, watery eyes, sneezing        Medication List     STOP taking these medications    enoxaparin 150 MG/ML injection Commonly known as: LOVENOX   Janumet 50-500 MG tablet Generic drug: sitaGLIPtin-metformin   Lantus SoloStar 100 UNIT/ML Solostar Pen Generic drug: insulin glargine       TAKE these medications    acetaminophen 325 MG tablet Commonly known as: TYLENOL Take 1-2 tablets (325-650 mg total) by mouth every 4 (four) hours as needed for mild pain.   atorvastatin 80 MG tablet Commonly known as: LIPITOR Take 1 tablet (80 mg total) by mouth daily.   ceFAZolin  IVPB Commonly known as: ANCEF Inject 2 g into the vein every 8 (eight) hours for 22 days. Indication:  MSSA + GBS bacteremia First Dose: Yes Last Day of Therapy:  09/24/21 Labs - Once weekly:  CBC/D and BMP, Labs - Every other week:  ESR and CRP Method of administration: IV Push Method of administration may be changed at the discretion of home infusion pharmacist based upon assessment of the patient and/or caregiver's ability to self-administer the medication ordered.   feeding supplement (PRO-STAT SUGAR FREE 64) Liqd Take 30 mLs by mouth 3 (three) times daily with meals.   gabapentin 300 MG capsule Commonly known as: NEURONTIN Take 2 capsules (600 mg total) by mouth 2 (two) times daily.   lisinopril 10 MG tablet Commonly known as: ZESTRIL Take 1 tablet (10 mg total) by mouth daily.   metFORMIN 500 MG tablet Commonly known as: GLUCOPHAGE Take 0.5 tablets (250 mg total) by mouth daily with breakfast. Start taking on: September 15, 2021   metoprolol tartrate 25 MG tablet Commonly known as: LOPRESSOR Take 1 tablet (25 mg total) by mouth 2 (two) times daily.   Oxycodone HCl 10 MG Tabs--Rx# 21 pills  Take 1-1.5 tablets (10-15 mg total) by mouth every 8 (eight) hours as needed (pain score 7-10). What changed:  when to take this reasons to take this Notes to patient: LIMIT  TO ONE PILL PER DAY   pantoprazole 40 MG tablet Commonly known as: PROTONIX Take 1 tablet (40 mg total) by mouth daily.   sertraline 50 MG tablet Commonly known as: ZOLOFT Take 1 tablet (50 mg total) by mouth daily.   warfarin 10 MG tablet Commonly known as: COUMADIN Take as directed. If you are unsure how  to take this medication, talk to your nurse or doctor. Original instructions: Take 2 tablets (20 mg total) by mouth daily at 4 PM. What changed:  medication strength how much to take when to take this Notes to patient: Take two pills (20 mg) only on 12/19. Starting tomorrow 09/15/21--> increase to 22 mg daily with supper. Coumadin clinic will advise you of any changes needed after WED appointment   warfarin 2 MG tablet Commonly known as: Coumadin Take as directed. If you are unsure how to take this medication, talk to your nurse or doctor. Original instructions: Take 1 tablet (2 mg total) by mouth daily at 4 PM. What changed: You were already taking a medication with the same name, and this prescription was added. Make sure you understand how and when to take each. Notes to patient: Start this tomorrow.                Discharge Care Instructions  (From admission, onward)           Start     Ordered   09/14/21 0000  Change dressing on IV access line weekly and PRN  (Home infusion instructions - [facility] )        09/14/21 1020            Follow-up Information     Lovorn, Jinny Blossom, MD Follow up.   Specialty: Physical Medicine and Rehabilitation Why: office will call you with follow up appointment Contact information: [address] [place] [address] [phone]         Noreene Larsson, NP. Call.   Specialty: Nurse Practitioner Why: for post hospital follow up Contact information: [address]  [address] [place] [address] [phone]         Satira Sark, MD Follow up on 09/16/2021.   Specialty:  Cardiology Why: Appointment at 9:30 for coumadin check. Contact information: [place] [address] [phone]         Newt Minion, MD Follow up on 09/17/2021.   Specialty: Orthopedic Surgery Why: keep post op appointment Contact information: [address] [place] [address] [phone]                 Signed: Bary Leriche 09/14/2021, 7:28 PM

## 2021-09-14 NOTE — Progress Notes (Signed)
ANTICOAGULATION CONSULT NOTE - Follow Up Consult  Pharmacy Consult for Warfarin Indication: mechanical AVR  Allergies  Allergen Reactions   Pollen Extract Other (See Comments)    Runny nose, watery eyes, sneezing    Patient Measurements: Height: 5\' 10"  (177.8 cm) Weight: (!) 150.2 kg (331 lb 2.1 oz) IBW/kg (Calculated) : 73  Vital Signs: Temp: 98.3 F (36.8 C) (12/19 0410) Temp Source: Oral (12/19 0410) BP: 103/61 (12/19 0410) Pulse Rate: 67 (12/19 0410)  Labs: Recent Labs    09/12/21 0519 09/13/21 0421 09/14/21 0348  HGB  --   --  10.4*  HCT  --   --  32.5*  PLT  --   --  297  LABPROT 29.7* 30.4* 32.7*  INR 2.8* 2.9* 3.2*  CREATININE  --   --  0.66     Estimated Creatinine Clearance: 171.4 mL/min (by C-G formula based on SCr of 0.66 mg/dL).   Medications:  Scheduled:   (feeding supplement) PROSource Plus  30 mL Oral BID BM   atorvastatin  80 mg Oral Daily   Chlorhexidine Gluconate Cloth  6 each Topical Daily   gabapentin  600 mg Oral BID   insulin aspart  0-5 Units Subcutaneous QHS   insulin aspart  0-9 Units Subcutaneous TID WC   lisinopril  10 mg Oral Daily   metFORMIN  250 mg Oral Q breakfast   metoprolol tartrate  25 mg Oral BID   pantoprazole  40 mg Oral Daily   sertraline  50 mg Oral Daily   sodium chloride flush  10-40 mL Intracatheter Q12H   Warfarin - Pharmacist Dosing Inpatient   Does not apply q1600   Infusions:    ceFAZolin (ANCEF) IV Stopped (09/14/21 09/16/21)    Assessment: 45 yo M with Mechanical Aortic valve, on Warfarin. Pharmacy consulted for Warfarin dosing.   Hgb 10s stable, INR up to 3.2. Plan to dc home today. Dose here is much higher than home. Will need INR check Wed.  PTA warfarin dose - 10mg  qday   - Ensure has 25% total daily value of Vitamin K - pt taking 2 a day-d/c'd 12/15. Watch for INR response to less Vit K intake. - Pt mentions that he is eating about ~40% more vegetables than outside the hospital.  Goal of Therapy:   INR 2.5-3.0 Monitor platelets by anticoagulation protocol: Yes   Plan:  Warfarin 20mg  x1 today then 22mg  PO qday Check INR Wednesday if dc home today Daily INR   1/16, PharmD, BCIDP, AAHIVP, CPP Infectious Disease Pharmacist 09/14/2021 8:23 AM

## 2021-09-14 NOTE — Progress Notes (Signed)
Inpatient Rehabilitation Discharge Medication Review by a Pharmacist  A complete drug regimen review was completed for this patient to identify any potential clinically significant medication issues.  High Risk Drug Classes Is patient taking? Indication by Medication  Antipsychotic Yes Prochlorperazine prn nausea for inpt only  Anticoagulant Yes Warfarin for mechanical aortic and valve stroke prophylaxis   Antibiotic Yes Cefazolin for osteomyelitis and bacteremia   Opioid Yes Oxycodone PRN pain   Antiplatelet No   Hypoglycemics/insulin Yes Metformin - DM  Vasoactive Medication Yes Metoprolol for hypertension   Chemotherapy No   Other Yes Neurontin - neuropathy Atorvastatin  - HLD     Type of Medication Issue Identified Description of Issue Recommendation(s)  Drug Interaction(s) (clinically significant)     Duplicate Therapy     Allergy     No Medication Administration End Date     Incorrect Dose     Additional Drug Therapy Needed     Significant med changes from prior encounter (inform family/care partners about these prior to discharge). Lisinopril decreased from 20mg  to 10mg   Home janumet held     Other       Clinically significant medication issues were identified that warrant physician communication and completion of prescribed/recommended actions by midnight of the next day:  No      Time spent performing this drug regimen review (minutes): 20 min  , PharmD, [place], AAHIVP, CPP Infectious Disease Pharmacist 09/14/2021 8:17 AM

## 2021-09-14 NOTE — Progress Notes (Signed)
PHARMACY CONSULT NOTE FOR:  OUTPATIENT  PARENTERAL ANTIBIOTIC THERAPY ([facility]AT)  Indication: MSSA + GBS bacteremia Regimen: Cefazolin 2g IV every 8 hours End date: 09/24/21  IV antibiotic discharge orders are pended. To discharging provider:  please sign these orders via discharge navigator,  Select New Orders & click on the button choice - Manage This Unsigned Work.     [name], PharmD, BCIDP, AAHIVP, CPP Infectious Disease Pharmacist 09/14/2021 10:42 AM

## 2021-09-14 NOTE — Progress Notes (Signed)
PROGRESS NOTE   Subjective/Complaints:  Pt reports he hasn't heard if IV ABX have been delivered to his home yet- will check with family- H/H nurse ot come in/at home teach pt how to give self IV ABX.   Ready for d/c today- explained will get 7 days of opiate pain meds- 30 days of other meds- call my clinic by Thursday for pain med refill if using as directed- or can call next week if using less often.  Clinic closed Friday and Monday.     ROS:  Pt denies SOB, abd pain, CP, N/V/C/D, and vision changes - phantom limb pain (+)   Objective:   No results found. Recent Labs    09/14/21 0348  WBC 7.2  HGB 10.4*  HCT 32.5*  PLT 297   Recent Labs    09/14/21 0348  NA 132*  K 4.1  CL 100  CO2 27  GLUCOSE 132*  BUN 14  CREATININE 0.66  CALCIUM 8.7*     Intake/Output Summary (Last 24 hours) at 09/14/2021 0841 Last data filed at 09/14/2021 0738 Gross per 24 hour  Intake 1880 ml  Output 2500 ml  Net -620 ml        Physical Exam: Vital Signs Blood pressure 103/61, pulse 67, temperature 98.3 F (36.8 C), temperature source Oral, resp. rate 20, height '5\' 10"'  (1.778 m), weight (!) 150.2 kg, SpO2 98 %.   General: awake, alert, appropriate, sitting up in bed; NAD HENT: conjugate gaze; oropharynx moist CV: regular rate; no JVD Pulmonary: CTA B/L; no W/R/R- good air movement GI: soft, NT, ND, (+)BS Psychiatric: appropriate Neurological: Ox3  Skin: L BKA with Shrinker in place- healing well with sutures and staples in place   Assessment/Plan: 1. Functional deficits which require 3+ hours per day of interdisciplinary therapy in a comprehensive inpatient rehab setting. Physiatrist is providing close team supervision and 24 hour management of active medical problems listed below. Physiatrist and rehab team continue to assess barriers to discharge/monitor patient progress toward functional and medical goals  Care  Tool:  Bathing    Body parts bathed by patient: Right arm, Left arm, Chest, Abdomen, Front perineal area, Right upper leg, Left upper leg, Face, Right lower leg, Buttocks   Body parts bathed by helper: Buttocks Body parts n/a: Left lower leg   Bathing assist Assist Level: Set up assist     Upper Body Dressing/Undressing Upper body dressing   What is the patient wearing?: Pull over shirt    Upper body assist Assist Level: Independent with assistive device    Lower Body Dressing/Undressing Lower body dressing      What is the patient wearing?: Underwear/pull up, Pants     Lower body assist Assist for lower body dressing: Independent with assitive device     Toileting Toileting    Toileting assist Assist for toileting: Independent with assistive device Assistive Device Comment: Rolling walker   Transfers Chair/bed transfer  Transfers assist     Chair/bed transfer assist level: Independent with assistive device Chair/bed transfer assistive device: Programmer, multimedia   Ambulation assist      Assist level: Independent with assistive device Assistive device:  Walker-rolling Max distance: 30 ft   Walk 10 feet activity   Assist     Assist level: Independent with assistive device Assistive device: Walker-rolling   Walk 50 feet activity   Assist Walk 50 feet with 2 turns activity did not occur: Safety/medical concerns         Walk 150 feet activity   Assist Walk 150 feet activity did not occur: Safety/medical concerns         Walk 10 feet on uneven surface  activity   Assist Walk 10 feet on uneven surfaces activity did not occur: Safety/medical concerns         Wheelchair     Assist Is the patient using a wheelchair?: Yes Type of Wheelchair: Manual    Wheelchair assist level: Independent Max wheelchair distance: 300+    Wheelchair 50 feet with 2 turns activity    Assist        Assist Level: Independent    Wheelchair 150 feet activity     Assist      Assist Level: Independent   Blood pressure 103/61, pulse 67, temperature 98.3 F (36.8 C), temperature source Oral, resp. rate 20, height '5\' 10"'  (1.778 m), weight (!) 150.2 kg, SpO2 98 %.  Medical Problem List and Plan: 1. Functional deficits secondary to L BKA             -patient may shower but incision must be covered             -ELOS/Goals: 10-14 days S  12/14- pt agreed to stay til 12/19 this AM due to IV ABX- continue CIR- PT and OT- work on higher level function.   12/19- d/c today- will verify with nursing to make sure pt gets IV ABX delivered and taught how to give them 2.  Mechanical Aortic valve/Antithrombotics: -DVT/anticoagulation:  Pharmaceutical: Coumadin with lovenox bridge till INR>2.5. Hopefully can d/c lovenox tomorrow as per pharmacy. Appreciate pharmacy note- will d/c ensure given Vitamin K content  12/19- INR 3.2- to have INR checked Wednesday since on higher dose of Coumadin here in hospital than was at home.              -antiplatelet therapy: N/A 3. Residual limb pain: decrease oxycodone to 32m q4H prn. Encouraged to only use as needed.   12/14- will increase gabapentin to 600 mg BID- and increase as needed for nerve pain  12/19- home on Gabapentin and prn oxycodone- last took yesterday per pt.  4. Mood: LCSW to follow for evaluation and support.              -antipsychotic agents: N/A 5. Neuropsych: This patient is capable of making decisions on his own behalf. 6. Skin/Wound Care: Routine pressure relief measures.              --wound VAC come off POD# 7 on 12/09  12/8- MASD in groin- WOC started silver antimicrobial wicking fabric  12/9- wound VAC is off- con't dressings- [place]- with his BMI of 50, will have a harder time with BKA/prosthesis.   12/13- BKA looking better/has retention sutures and staples- explained will be 3-4 minimum before removed by  Surgeon.   12/19- pt needs f/u with Surgeon after d/c for suture/staples removal and overall f/u 7. Fluids/Electrolytes/Nutrition: Monitor I/O. Check CMET in am.  8. Sepsis secondary to MSSA bacteremia: continue IV cefazolin with end date 09/24/21.  12/13- ESR 65 down from 109- CRP 3.4- down from  12.8- so doing much better- con't weekly labs.   12/19- CRP 1.5 and ESR 60- still coming down.              --weekly labs 9. T2DM: Uncontrolled with A1C-7.8. Monitor BS ac/hs and use SSI for elevated BS             --start metformin 241m daily             --continue insulin glargine 15 units (was on 30 units PTA) with SSI for elevated BS.    12/12- CBGs controlled- con't regimen 10. HTN: Monitor BP TID--has been running low             --Lisinopril 10 mg with metoprolol bid. Monitor for orthostatic changes.   12/10- BP well controlled- con't regimen 11. Super morbid obesity: BMI 54.5. will consult dietician to educate on diet --Educate on weight loss and dietary changes to help promote overall health and mobility.   12/9- BMI down to 50- con't education 12/12- BMI stable- con't decrease in intake 12. Hyponatremia: Recheck lytes in am. 12/8- Na 128- will recheck in AM- if still <130, will put on fluid restriction  12/9- Na back up to 133- will recheck weekly and prn.   12/19- Na 132-  13. Malnutrition: Albumin has dropped from 3.2-->2.7 today.             --add protein supplement TID.  12/13- will change BMP to CMP so can follow labs.  14. Leucocytosis:Has been fluctuating 15.3-->12.3-->14.3  12/8- WBC down to 10.7- so doing much better- con't to monitor  12/12- WBC down to 9.9- con't to monitor --Monitor for fevers and trends.   15. Nerve/phantom pain  12/14- will increase gabapentin to 600 mg BID-  12/16: appears to have helped, continue current regimen   LOS: 12 days A FACE TO FACE EVALUATION WAS PERFORMED  Adam Long 09/14/2021, 8:41 AM

## 2021-09-14 NOTE — Progress Notes (Addendum)
Pam IV abts is scheduled to be here for teaching between 0900 and 0930  Teaching completed by Pam with IV ABTS

## 2021-09-16 ENCOUNTER — Ambulatory Visit (INDEPENDENT_AMBULATORY_CARE_PROVIDER_SITE_OTHER): Payer: 59 | Admitting: *Deleted

## 2021-09-16 DIAGNOSIS — Z5181 Encounter for therapeutic drug level monitoring: Secondary | ICD-10-CM | POA: Diagnosis not present

## 2021-09-16 DIAGNOSIS — Z952 Presence of prosthetic heart valve: Secondary | ICD-10-CM | POA: Diagnosis not present

## 2021-09-16 LAB — POCT INR: INR: 3.1 — AB (ref 2.0–3.0)

## 2021-09-16 NOTE — Patient Instructions (Signed)
D/C 12/19 from [facility] on Warfarin 22mg  daily Decrease warfarin to 20mg  daily Recheck in 1 wk

## 2021-09-17 ENCOUNTER — Telehealth: Payer: Self-pay | Admitting: Orthopedic Surgery

## 2021-09-17 ENCOUNTER — Encounter: Payer: 59 | Admitting: Orthopedic Surgery

## 2021-09-17 NOTE — Telephone Encounter (Signed)
Yes it is ok to move appt to next week He was just d/c for inpatient rehab at the hospital on 09/14/21. Can you please sch? Thanks!

## 2021-09-17 NOTE — Telephone Encounter (Signed)
Pt called and needs to r/s due to weather. He wants to r/s to wed next week but pt is post op 3 weeks from yesterday. Ok to keep him on wed 01/04?

## 2021-09-23 ENCOUNTER — Encounter: Payer: Self-pay | Admitting: Family

## 2021-09-23 ENCOUNTER — Ambulatory Visit (INDEPENDENT_AMBULATORY_CARE_PROVIDER_SITE_OTHER): Payer: 59 | Admitting: Family

## 2021-09-23 ENCOUNTER — Other Ambulatory Visit: Payer: Self-pay

## 2021-09-23 ENCOUNTER — Telehealth: Payer: Self-pay

## 2021-09-23 DIAGNOSIS — Z89512 Acquired absence of left leg below knee: Secondary | ICD-10-CM

## 2021-09-23 DIAGNOSIS — S88112A Complete traumatic amputation at level between knee and ankle, left lower leg, initial encounter: Secondary | ICD-10-CM

## 2021-09-23 NOTE — Progress Notes (Signed)
Post-Op Visit Note   Patient: Adam Long           Date of Birth: Jun 11, 1976           MRN: [id number] Visit Date: 09/23/2021 PCP: Heather Roberts, NP  Chief Complaint:  Chief Complaint  Patient presents with   Left Leg - Routine Post Op    Left BKA 08/28/21    HPI:  HPI The patient is a 45 year old gentleman seen status post left below-knee amputation on December 2.  He had a prolonged hospital stay.  He is home now uses a limb protector still he has been wearing his medical compression stocking with direct skin contact.  He is on Coumadin and has had prolonged healing time  Ortho Exam On examination of the residual limb there is significant edema this is a chronic issue for him there are sutures and staples in place there is no gaping no drainage fortunately he does continue with serosanguineous drainage over the lateral 25% of the incision there is no erythema no warmth no sign of infection  Visit Diagnoses:  1. Below-knee amputation of left lower extremity (HCC)     Plan: Majority of staples harvested today.  Did harvest about 50% of his sutures.  We will leave the remainder he will follow-up in 1 more week for remaining sutures and staples to be harvested continue with daily Dial soap cleansing and dry dressings  Follow-Up Instructions: No follow-ups on file.   Imaging: No results found.  Orders:  No orders of the defined types were placed in this encounter.  No orders of the defined types were placed in this encounter.    PMFS History: Patient Active Problem List   Diagnosis Date Noted   Below-knee amputation of left lower extremity (HCC) 09/02/2021   Severe protein-calorie malnutrition (HCC)    MSSA bacteremia    Ulcer of left foot (HCC)    Subacute osteomyelitis, left ankle and foot (HCC)    Diabetic foot infection (HCC) 02/14/2021   Diabetic ulcer of left foot associated with type 1 diabetes mellitus, limited to breakdown of skin (HCC)    Leukocytosis  02/13/2021   Hyponatremia 02/13/2021   Normocytic anemia 02/13/2021   Hyperglycemia due to diabetes mellitus (HCC) 02/13/2021   Open wound of left foot 02/12/2021   Left leg swelling 02/02/2021   History of complete ray amputation of fifth toe of left foot (HCC) 11/04/2020   Immunization due 11/04/2020   Anxiety 09/30/2020   Preventative health care 12/13/2019   S/P aortic valve replacement with mechanical valve 12/05/2019   S/P AVR (aortic valve replacement) 12/05/2019   Syncope 11/10/2019   Personal history of noncompliance with medical treatment, presenting hazards to health 03/16/2018   Uncontrolled type 2 diabetes mellitus with complication 10/12/2015   Hyperlipidemia 10/12/2015   Morbid obesity (HCC) 10/12/2015   Chest pain 07/02/2013   HTN (hypertension) 07/02/2013   Past Medical History:  Diagnosis Date   Anxiety    Aortic stenosis    Cellulitis and abscess of lower extremity 06/11/2019   Cellulitis of fourth toe of left foot    Cholelithiasis    Coronary artery disease    Nonobstructive CAD (40-50% LAD) 08/2019   Depression    Depression    Phreesia 09/27/2020   Depression    Phreesia 11/01/2020   Diabetes mellitus without complication (HCC)    Phreesia 09/27/2020   Elevated troponin level not due myocardial infarction 11/11/2019   Essential hypertension  Gangrene of toe of left foot (HCC) 07/06/2019   Heart murmur    Phreesia 09/27/2020   Hyperlipidemia    Phreesia 09/27/2020   Hypertension    Phreesia 09/27/2020   Mixed hyperlipidemia    Morbid obesity (HCC)    S/P aortic valve replacement with mechanical valve 12/05/2019   25 mm Carbomedics top hat bileaflet mechanical valve via partial upper hemi-sternotomy   Severe aortic stenosis 09/24/2019   Type 2 diabetes mellitus (HCC)     Family History  Problem Relation Age of Onset   Cancer Mother        Brain   Heart disease Father    Hyperlipidemia Father    Hypertension Father    Stroke Father     Heart murmur Sister     Past Surgical History:  Procedure Laterality Date   ABDOMINAL AORTOGRAM W/LOWER EXTREMITY N/A 07/06/2019   Procedure: ABDOMINAL AORTOGRAM W/LOWER EXTREMITY;  Surgeon: Sherren Kerns, MD;  Location: [facility] INVASIVE CV LAB;  Service: Cardiovascular;  Laterality: N/A;  Bilateral   AMPUTATION Left 07/09/2019   Procedure: LEFT FOURTH and Fifth TOE AMPUTATION.;  Surgeon: Larina Earthly, MD;  Location: [facility] OR;  Service: Vascular;  Laterality: Left;   AMPUTATION Left 03/11/2021   Procedure: LEFT FOOT 5TH  AND 4TH RAY AMPUTATION;  Surgeon: Nadara Mustard, MD;  Location: [facility] OR;  Service: Orthopedics;  Laterality: Left;   AMPUTATION Left 08/28/2021   Procedure: AMPUTATION BELOW KNEE;  Surgeon: Nadara Mustard, MD;  Location: [facility] OR;  Service: Orthopedics;  Laterality: Left;   AORTIC VALVE REPLACEMENT N/A 12/05/2019   Procedure: PARTIAL STERNOTOMY FOR AORTIC VALVE REPLACEMENT (AVR), USING CARBOMEDICS SUPRA-ANNULAR TOP HAT ;  Surgeon: Purcell Nails, MD;  Location: [facility] OR;  Service: Open Heart Surgery;  Laterality: N/A;  No neck lines on left   CARDIAC VALVE REPLACEMENT N/A    Phreesia 09/27/2020   IR RADIOLOGY PERIPHERAL GUIDED IV START  10/05/2019   IR US GUIDE VASC ACCESS RIGHT  10/05/2019   MULTIPLE EXTRACTIONS WITH ALVEOLOPLASTY N/A 10/26/2019   Procedure: EXTRACTION OF TOOTH #'S 3, 5-11,19-28,  AND 32 WITH ALVEOLOPLASTY;  Surgeon: Charlynne Pander, DDS;  Location: [facility] OR;  Service: Oral Surgery;  Laterality: N/A;   RIGHT HEART CATH AND CORONARY ANGIOGRAPHY N/A 09/24/2019   Procedure: RIGHT HEART CATH AND CORONARY ANGIOGRAPHY;  Surgeon: Tonny Bollman, MD;  Location: [facility] INVASIVE CV LAB;  Service: Cardiovascular;  Laterality: N/A;   TEE WITHOUT CARDIOVERSION N/A 12/05/2019   Procedure: TRANSESOPHAGEAL ECHOCARDIOGRAM (TEE);  Surgeon: Purcell Nails, MD;  Location: [facility] OR;  Service: Open Heart Surgery;  Laterality: N/A;   TEE WITHOUT CARDIOVERSION N/A 09/01/2021   Procedure:  TRANSESOPHAGEAL ECHOCARDIOGRAM (TEE);  Surgeon: Little Ishikawa, MD;  Location: [facility] ENDOSCOPY;  Service: Cardiovascular;  Laterality: N/A;   Social History   Occupational History    Comment: Glass blower/designer- self-employed  Tobacco Use   Smoking status: Never   Smokeless tobacco: Former    Types: Chew    Quit date: 2021  Vaping Use   Vaping Use: Never used  Substance and Sexual Activity   Alcohol use: Not Currently    Comment: occasionally   Drug use: No   Sexual activity: Yes    Birth control/protection: None

## 2021-09-23 NOTE — Telephone Encounter (Signed)
Patient is calling to request a refill on Oxycodone 10 mg. Per PMP, it was last prescribed by Marissa Nestle on 09/14/21 for a 5 day supply. Please advise.

## 2021-09-24 ENCOUNTER — Ambulatory Visit (INDEPENDENT_AMBULATORY_CARE_PROVIDER_SITE_OTHER): Payer: 59 | Admitting: *Deleted

## 2021-09-24 DIAGNOSIS — Z5181 Encounter for therapeutic drug level monitoring: Secondary | ICD-10-CM

## 2021-09-24 DIAGNOSIS — Z952 Presence of prosthetic heart valve: Secondary | ICD-10-CM

## 2021-09-24 LAB — POCT INR: INR: 5.6 — AB (ref 2.0–3.0)

## 2021-09-24 MED ORDER — OXYCODONE HCL 10 MG PO TABS: 10.0000 mg | ORAL_TABLET | Freq: Three times a day (TID) | ORAL | 0 refills | Status: DC | PRN

## 2021-09-24 NOTE — Addendum Note (Signed)
Addended by: Genice Rouge on: 09/24/2021 11:33 AM   Modules accepted: Orders

## 2021-09-24 NOTE — Telephone Encounter (Signed)
Patient notified of prescription sent to pharmacy.

## 2021-09-24 NOTE — Patient Instructions (Signed)
Hold warfarin tonight and tomorrow night then decrease dose to 10mg  daily Recheck in 1 wk

## 2021-09-30 ENCOUNTER — Encounter: Payer: Medicaid Other | Admitting: Family

## 2021-10-01 ENCOUNTER — Ambulatory Visit (INDEPENDENT_AMBULATORY_CARE_PROVIDER_SITE_OTHER): Payer: Self-pay | Admitting: *Deleted

## 2021-10-01 DIAGNOSIS — Z952 Presence of prosthetic heart valve: Secondary | ICD-10-CM

## 2021-10-01 DIAGNOSIS — Z5181 Encounter for therapeutic drug level monitoring: Secondary | ICD-10-CM

## 2021-10-01 LAB — POCT INR: INR: 1.4 — AB (ref 2.0–3.0)

## 2021-10-01 NOTE — Patient Instructions (Signed)
Restart warfarin 1 1/2 tablets daily Recheck in 1 wk

## 2021-10-02 ENCOUNTER — Ambulatory Visit (INDEPENDENT_AMBULATORY_CARE_PROVIDER_SITE_OTHER): Payer: 59 | Admitting: Family

## 2021-10-02 ENCOUNTER — Ambulatory Visit: Payer: 59 | Admitting: Internal Medicine

## 2021-10-02 DIAGNOSIS — Z89512 Acquired absence of left leg below knee: Secondary | ICD-10-CM

## 2021-10-02 DIAGNOSIS — S88112A Complete traumatic amputation at level between knee and ankle, left lower leg, initial encounter: Secondary | ICD-10-CM

## 2021-10-05 ENCOUNTER — Ambulatory Visit: Payer: 59 | Admitting: Nurse Practitioner

## 2021-10-06 ENCOUNTER — Telehealth: Payer: Self-pay

## 2021-10-06 NOTE — Telephone Encounter (Signed)
Received call from [facility] at [facility] regarding pull PICC orders at end of IV antibiotic treatment. Per Dr. Juleen China inpatient note on 12/7 can pull PICC at end of IV therapy. End date of antibiotics on 09/24/21. Caswell Corwin verbal order to pull PICC per Dr. Juleen China note on 09/02/21.  Lestine Box, PharmD PGY2 Infectious Diseases Pharmacy Resident

## 2021-10-07 ENCOUNTER — Encounter: Payer: Self-pay | Admitting: Family

## 2021-10-07 NOTE — Progress Notes (Signed)
Post-Op Visit Note   Patient: Adam Long           Date of Birth: 01/17/1976           MRN: [id number] Visit Date: 09/23/2021 PCP: Heather Roberts, NP  Chief Complaint:  Chief Complaint  Patient presents with   Left Leg - Routine Post Op    Left BKA 08/28/21    HPI:  HPI The patient is a 46 year old gentleman seen status post left below-knee amputation on December 2.    Ortho Exam On examination of the residual limb there is significant edema this incision is much improved. there are some sutures and staples in place there is no gaping no drainage. he does continue with serosanguineous drainage over the lateral 25% of the incision there is no erythema no warmth no sign of infection  Visit Diagnoses:  1. Below-knee amputation of left lower extremity (HCC)     Plan: remainder of sutures and stables harvested. continue with daily Dial soap cleansing. Shrinker with direct skin contact.  Follow-Up Instructions: No follow-ups on file.   Imaging: No results found.  Orders:  No orders of the defined types were placed in this encounter.  No orders of the defined types were placed in this encounter.    PMFS History: Patient Active Problem List   Diagnosis Date Noted   Below-knee amputation of left lower extremity (HCC) 09/02/2021   Severe protein-calorie malnutrition (HCC)    MSSA bacteremia    Ulcer of left foot (HCC)    Subacute osteomyelitis, left ankle and foot (HCC)    Diabetic foot infection (HCC) 02/14/2021   Diabetic ulcer of left foot associated with type 1 diabetes mellitus, limited to breakdown of skin (HCC)    Leukocytosis 02/13/2021   Hyponatremia 02/13/2021   Normocytic anemia 02/13/2021   Hyperglycemia due to diabetes mellitus (HCC) 02/13/2021   Open wound of left foot 02/12/2021   Left leg swelling 02/02/2021   History of complete ray amputation of fifth toe of left foot (HCC) 11/04/2020   Immunization due 11/04/2020   Anxiety 09/30/2020    Preventative health care 12/13/2019   S/P aortic valve replacement with mechanical valve 12/05/2019   S/P AVR (aortic valve replacement) 12/05/2019   Syncope 11/10/2019   Personal history of noncompliance with medical treatment, presenting hazards to health 03/16/2018   Uncontrolled type 2 diabetes mellitus with complication 10/12/2015   Hyperlipidemia 10/12/2015   Morbid obesity (HCC) 10/12/2015   Chest pain 07/02/2013   HTN (hypertension) 07/02/2013   Past Medical History:  Diagnosis Date   Anxiety    Aortic stenosis    Cellulitis and abscess of lower extremity 06/11/2019   Cellulitis of fourth toe of left foot    Cholelithiasis    Coronary artery disease    Nonobstructive CAD (40-50% LAD) 08/2019   Depression    Depression    Phreesia 09/27/2020   Depression    Phreesia 11/01/2020   Diabetes mellitus without complication (HCC)    Phreesia 09/27/2020   Elevated troponin level not due myocardial infarction 11/11/2019   Essential hypertension    Gangrene of toe of left foot (HCC) 07/06/2019   Heart murmur    Phreesia 09/27/2020   Hyperlipidemia    Phreesia 09/27/2020   Hypertension    Phreesia 09/27/2020   Mixed hyperlipidemia    Morbid obesity (HCC)    S/P aortic valve replacement with mechanical valve 12/05/2019   25 mm Carbomedics top hat bileaflet mechanical valve via  partial upper hemi-sternotomy   Severe aortic stenosis 09/24/2019   Type 2 diabetes mellitus (HCC)     Family History  Problem Relation Age of Onset   Cancer Mother        Brain   Heart disease Father    Hyperlipidemia Father    Hypertension Father    Stroke Father    Heart murmur Sister     Past Surgical History:  Procedure Laterality Date   ABDOMINAL AORTOGRAM W/LOWER EXTREMITY N/A 07/06/2019   Procedure: ABDOMINAL AORTOGRAM W/LOWER EXTREMITY;  Surgeon: Sherren Kerns, MD;  Location: [facility] INVASIVE CV LAB;  Service: Cardiovascular;  Laterality: N/A;  Bilateral   AMPUTATION Left 07/09/2019    Procedure: LEFT FOURTH and Fifth TOE AMPUTATION.;  Surgeon: Larina Earthly, MD;  Location: [facility] OR;  Service: Vascular;  Laterality: Left;   AMPUTATION Left 03/11/2021   Procedure: LEFT FOOT 5TH  AND 4TH RAY AMPUTATION;  Surgeon: Nadara Mustard, MD;  Location: [facility] OR;  Service: Orthopedics;  Laterality: Left;   AMPUTATION Left 08/28/2021   Procedure: AMPUTATION BELOW KNEE;  Surgeon: Nadara Mustard, MD;  Location: [facility] OR;  Service: Orthopedics;  Laterality: Left;   AORTIC VALVE REPLACEMENT N/A 12/05/2019   Procedure: PARTIAL STERNOTOMY FOR AORTIC VALVE REPLACEMENT (AVR), USING CARBOMEDICS SUPRA-ANNULAR TOP HAT ;  Surgeon: Purcell Nails, MD;  Location: [facility] OR;  Service: Open Heart Surgery;  Laterality: N/A;  No neck lines on left   CARDIAC VALVE REPLACEMENT N/A    Phreesia 09/27/2020   IR RADIOLOGY PERIPHERAL GUIDED IV START  10/05/2019   IR US GUIDE VASC ACCESS RIGHT  10/05/2019   MULTIPLE EXTRACTIONS WITH ALVEOLOPLASTY N/A 10/26/2019   Procedure: EXTRACTION OF TOOTH #'S 3, 5-11,19-28,  AND 32 WITH ALVEOLOPLASTY;  Surgeon: Charlynne Pander, DDS;  Location: [facility] OR;  Service: Oral Surgery;  Laterality: N/A;   RIGHT HEART CATH AND CORONARY ANGIOGRAPHY N/A 09/24/2019   Procedure: RIGHT HEART CATH AND CORONARY ANGIOGRAPHY;  Surgeon: Tonny Bollman, MD;  Location[facility] INVASIVE CV LAB;  Service: Cardiovascular;  Laterality: N/A;   TEE WITHOUT CARDIOVERSION N/A 12/05/2019   Procedure: TRANSESOPHAGEAL ECHOCARDIOGRAM (TEE);  Surgeon: Purcell Nails, MD;  Location: [facility] OR;  Service: Open Heart Surgery;  Laterality: N/A;   TEE WITHOUT CARDIOVERSION N/A 09/01/2021   Procedure: TRANSESOPHAGEAL ECHOCARDIOGRAM (TEE);  Surgeon: Little Ishikawa, MD;  Location: [facility] ENDOSCOPY;  Service: Cardiovascular;  Laterality: N/A;   Social History   Occupational History    Comment: Glass blower/designer- self-employed  Tobacco Use   Smoking status: Never   Smokeless tobacco: Former    Types: Chew    Quit date: 2021   Vaping Use   Vaping Use: Never used  Substance and Sexual Activity   Alcohol use: Not Currently    Comment: occasionally   Drug use: No   Sexual activity: Yes    Birth control/protection: None

## 2021-10-08 ENCOUNTER — Ambulatory Visit (INDEPENDENT_AMBULATORY_CARE_PROVIDER_SITE_OTHER): Payer: 59 | Admitting: *Deleted

## 2021-10-08 DIAGNOSIS — Z5181 Encounter for therapeutic drug level monitoring: Secondary | ICD-10-CM | POA: Diagnosis not present

## 2021-10-08 DIAGNOSIS — Z952 Presence of prosthetic heart valve: Secondary | ICD-10-CM

## 2021-10-08 LAB — POCT INR: INR: 4.3 — AB (ref 2.0–3.0)

## 2021-10-08 NOTE — Patient Instructions (Signed)
Hold warfarin tonight then decrease dose to 1 1/2 tablets daily except 1 tablet on Mondays, Wednesdays and Fridays Recheck in 1 wk

## 2021-10-13 ENCOUNTER — Ambulatory Visit (INDEPENDENT_AMBULATORY_CARE_PROVIDER_SITE_OTHER): Payer: 59 | Admitting: Internal Medicine

## 2021-10-13 ENCOUNTER — Encounter: Payer: Self-pay | Admitting: Internal Medicine

## 2021-10-13 ENCOUNTER — Other Ambulatory Visit: Payer: Self-pay

## 2021-10-13 VITALS — BP 110/74 | HR 93 | Temp 98.2°F

## 2021-10-13 DIAGNOSIS — E11628 Type 2 diabetes mellitus with other skin complications: Secondary | ICD-10-CM | POA: Diagnosis not present

## 2021-10-13 DIAGNOSIS — R7881 Bacteremia: Secondary | ICD-10-CM

## 2021-10-13 DIAGNOSIS — B9561 Methicillin susceptible Staphylococcus aureus infection as the cause of diseases classified elsewhere: Secondary | ICD-10-CM

## 2021-10-13 DIAGNOSIS — Z954 Presence of other heart-valve replacement: Secondary | ICD-10-CM | POA: Diagnosis not present

## 2021-10-13 DIAGNOSIS — L089 Local infection of the skin and subcutaneous tissue, unspecified: Secondary | ICD-10-CM

## 2021-10-13 NOTE — Assessment & Plan Note (Signed)
°  Patient completed 4 weeks of IV cefazolin for MSSA/GBS bacteremia in setting of left diabetic foot ulcer s/p definitive source control with BKA.  PICC line was removed a couple weeks ago and reports no new concerns.  TEE was negative for vegetations in the setting of prosthetic aortic valve.  RTC as needed.

## 2021-10-13 NOTE — Progress Notes (Signed)
[facility]ious [facility]  CHIEF COMPLAINT:    Follow up for bacteremia  SUBJECTIVE:    Adam Long is a 46 y.o. male with PMHx as below who presents to the clinic for bacteremia.   Patient was hospitalized from 12/1-12/7 for a left diabetic foot infection complicated by MSSA and GBS bacteremia.  This was further complicated by the presence of a mechanical aortic valve.  He was started on IV antibiotics and underwent BKA on 08/28/21.  Blood cultures were cleared as of 12/2 as well.  TEE was negative for vegetations and patient was discharged to CIR on 12/7 where he remained until 12/19.  He completed 4 weeks of IV cefazolin on 12/29 and PICC line has subsequently been removed. He has been following with orthopedics.  His BKA site is doing well.  He had a small hematoma there that bled some but improving and no purulent drainage.  He feels improve and better energy.  No fevers or chills.   Please see A&P for the details of today's visit and status of the patient's medical problems.   Patient's Medications  New Prescriptions   No medications on file  Previous Medications   ACETAMINOPHEN (TYLENOL) 325 MG TABLET    Take 1-2 tablets (325-650 mg total) by mouth every 4 (four) hours as needed for mild pain.   AMINO ACIDS-PROTEIN HYDROLYS (FEEDING SUPPLEMENT, PRO-STAT SUGAR FREE 64,) LIQD    Take 30 mLs by mouth 3 (three) times daily with meals.   ATORVASTATIN (LIPITOR) 80 MG TABLET    Take 1 tablet (80 mg total) by mouth daily.   GABAPENTIN (NEURONTIN) 300 MG CAPSULE    Take 2 capsules (600 mg total) by mouth 2 (two) times daily.   LISINOPRIL (ZESTRIL) 10 MG TABLET    Take 1 tablet (10 mg total) by mouth daily.   METFORMIN (GLUCOPHAGE) 500 MG TABLET    Take 0.5 tablets (250 mg total) by mouth daily with breakfast.   METOPROLOL TARTRATE (LOPRESSOR) 25 MG TABLET    Take 1 tablet (25 mg total) by mouth 2 (two) times daily.   OXYCODONE HCL 10 MG TABS    Take 1 tablet (10 mg  total) by mouth every 8 (eight) hours as needed.   PANTOPRAZOLE (PROTONIX) 40 MG TABLET    Take 1 tablet (40 mg total) by mouth daily.   SERTRALINE (ZOLOFT) 50 MG TABLET    Take 1 tablet (50 mg total) by mouth daily.   WARFARIN (COUMADIN) 10 MG TABLET    Take 2 tablets (20 mg total) by mouth daily at 4 PM.   WARFARIN (COUMADIN) 2 MG TABLET    Take 1 tablet (2 mg total) by mouth daily at 4 PM.  Modified Medications   No medications on file  Discontinued Medications   No medications on file      Past Medical History:  Diagnosis Date   Anxiety    Aortic stenosis    Cellulitis and abscess of lower extremity 06/11/2019   Cellulitis of fourth toe of left foot    Cholelithiasis    Coronary artery disease    Nonobstructive CAD (40-50% LAD) 08/2019   Depression    Depression    Phreesia 09/27/2020   Depression    Phreesia 11/01/2020   Diabetes mellitus without complication ([place])    Phreesia 09/27/2020   Elevated troponin level not due myocardial infarction 11/11/2019   Essential hypertension    Gangrene of toe  of left foot ([place]) 07/06/2019   Heart murmur    Phreesia 09/27/2020   Hyperlipidemia    Phreesia 09/27/2020   Hypertension    Phreesia 09/27/2020   Mixed hyperlipidemia    Morbid obesity (HCC)    S/P aortic valve replacement with mechanical valve 12/05/2019   25 mm Carbomedics top hat bileaflet mechanical valve via partial upper hemi-sternotomy   Severe aortic stenosis 09/24/2019   Type 2 diabetes mellitus (HCC)     Social History   Tobacco Use   Smoking status: Never   Smokeless tobacco: Former    Types: Chew    Quit date: 2021  Vaping Use   Vaping Use: Never used  Substance Use Topics   Alcohol use: Not Currently    Comment: occasionally   Drug use: No    Family History  Problem Relation Age of Onset   Cancer Mother        Brain   Heart disease Father    Hyperlipidemia Father    Hypertension Father    Stroke Father    Heart murmur Sister      Allergies  Allergen Reactions   Pollen Extract Other (See Comments)    Runny nose, watery eyes, sneezing    Review of Systems  All other systems reviewed and are negative. Except as noted above.    OBJECTIVE:    Vitals:   10/13/21 1605  BP: 110/74  Pulse: 93  Temp: 98.2 F (36.8 C)  TempSrc: Temporal  SpO2: 97%   There is no height or weight on file to calculate BMI.  Physical Exam Constitutional:      Appearance: Normal appearance.  HENT:     Head: Normocephalic and atraumatic.  Pulmonary:     Effort: Pulmonary effort is normal. No respiratory distress.  Musculoskeletal:     Comments: Left BKA.   Neurological:     General: No focal deficit present.     Mental Status: He is alert and oriented to person, place, and time.  Psychiatric:        Mood and Affect: Mood normal.        Behavior: Behavior normal.     Labs and Microbiology: CBC Latest Ref Rng & Units 09/14/2021 09/11/2021 09/09/2021  WBC 4.0 - 10.5 K/uL 7.2 8.2 9.7  Hemoglobin 13.0 - 17.0 g/dL 10.4(L) 10.2(L) 10.4(L)  Hematocrit 39.0 - 52.0 % 32.5(L) 31.7(L) 32.4(L)  Platelets 150 - 400 K/uL 297 317 328   CMP Latest Ref Rng & Units 09/14/2021 09/07/2021 09/04/2021  Glucose 70 - 99 mg/dL 132(H) 132(H) 129(H)  BUN 6 - 20 mg/dL 14 13 16   Creatinine 0.61 - 1.24 mg/dL 0.66 0.58(L) 0.65  Sodium 135 - 145 mmol/L 132(L) 132(L) 133(L)  Potassium 3.5 - 5.1 mmol/L 4.1 4.2 4.1  Chloride 98 - 111 mmol/L 100 99 99  CO2 22 - 32 mmol/L 27 27 26   Calcium 8.9 - 10.3 mg/dL 8.7(L) 8.5(L) 8.5(L)  Total Protein 6.5 - 8.1 g/dL 7.2 - -  Total Bilirubin 0.3 - 1.2 mg/dL 0.8 - -  Alkaline Phos 38 - 126 U/L 69 - -  AST 15 - 41 U/L 24 - -  ALT 0 - 44 U/L 25 - -        ASSESSMENT & PLAN:    MSSA bacteremia  Patient completed 4 weeks of IV cefazolin for MSSA/GBS bacteremia in setting of left diabetic foot ulcer s/p definitive source control with BKA.  PICC line was removed a couple weeks ago  and reports no new  concerns.  TEE was negative for vegetations in the setting of prosthetic aortic valve.  RTC as needed.     Raynelle [facility] for Infectious Disease [facility] 10/13/2021, 4:19 PM

## 2021-10-14 ENCOUNTER — Other Ambulatory Visit: Payer: Self-pay

## 2021-10-14 ENCOUNTER — Encounter: Payer: 59 | Attending: Registered Nurse | Admitting: Registered Nurse

## 2021-10-14 VITALS — BP 114/3 | HR 94 | Temp 98.1°F | Ht 70.0 in | Wt 340.0 lb

## 2021-10-14 DIAGNOSIS — I10 Essential (primary) hypertension: Secondary | ICD-10-CM | POA: Diagnosis present

## 2021-10-14 DIAGNOSIS — G546 Phantom limb syndrome with pain: Secondary | ICD-10-CM | POA: Diagnosis present

## 2021-10-14 DIAGNOSIS — S88112A Complete traumatic amputation at level between knee and ankle, left lower leg, initial encounter: Secondary | ICD-10-CM

## 2021-10-14 NOTE — Progress Notes (Signed)
Subjective:    Patient ID: Adam Long, male    DOB: 05/22/1976, 46 y.o.   MRN: [id number]  HPI: Adam Long is a 46 y.o. male who is here for HFU appointment of his Below-Knee Amputation and Essential Hypertension.  [name] [facility] 08[facility] for Left Foot Pain  H&P: Dr Tat on 08/27/2022 Patient coming from: Home  Chief Complaint: left foot infection   HPI:  Adam Long is a 46 y.o. male with medical history of diabetes mellitus type 2, mechanical aortic valve replacement on warfarin, coronary disease, hypertension, hyperlipidemia, morbid obesity presenting with left foot pain, drainage, and edema.  Patient states that he noticed some "dead skin" on his left foot about 2 weeks prior to this admission.  He did not think much about it because it was not causing significant pain.  However in the past week he has noted some increasing edema, erythema and drainage which has significantly worsened over the past 2 to 3 days.  He has had some subjective chills but denies any fevers.  He denies any headache, neck pain, chest pain, shortness of breath, vomiting, diarrhea, abdominal pain.  He has some nausea.  Because of his worsening left foot symptoms, he presented for further evaluation. Notably, the patient had a left fourth and fifth ray amputation on 03/11/2021 performed by Dr. Aldean BakerMarcus Duda.  He is also being followed by vascular surgery.     His foot was not salvageable  he underwent Left BKA by Dr Lajoyce Cornersuda on 08/28/2022 DG Left Foot IMPRESSION: 1. Increased soft tissue swelling and new soft tissue gas of the lateral left foot, findings are concerning for infection with gas-forming organism. 2. No definite osteomyelitis, although chronic changes somewhat limited evaluation. IF there is clinical concern, consider MRI.  He underwent on 08/28/2021 by Dr Lajoyce Cornersuda:    AMPUTATION BELOW KNEE Left  He was admitted to inpatient Rehabilitation on 09/02/2021 and  discharged home on 09/14/2021. Continue HEP as Tolerated. Social worker was unable to obtain Home Health, due to his insurance ending at the end of December.   He denies any pain. He rates his pain 0. Also reports he has a good appetite.   Pain Inventory Average Pain 6 Pain Right Now 0 My pain is intermittent, dull, and stabbing  In the last 24 hours, has pain interfered with the following? General activity 3 Relation with others 0 Enjoyment of life 3 What TIME of day is your pain at its worst? night Sleep (in general) Fair  Pain is worse with: some activites Pain improves with: rest and medication Relief from Meds: 10  use a walker how many minutes can you walk? 5 ability to climb steps?  no do you drive?  yes use a wheelchair transfers alone  disabled: date disabled . I need assistance with the following:  household duties and shopping  weakness depression anxiety  Hospital f/u  Hospital f/u    Family History  Problem Relation Age of Onset   Cancer Mother        Brain   Heart disease Father    Hyperlipidemia Father    Hypertension Father    Stroke Father    Heart murmur Sister    Social History   Socioeconomic History   Marital status: Single    Spouse name: Not on file   Number of children: Not on file   Years of education: Not on file   Highest education level:  Not on file  Occupational History    Comment: Glass blower/designer- self-employed  Tobacco Use   Smoking status: Never   Smokeless tobacco: Former    Types: Chew    Quit date: 2021  Vaping Use   Vaping Use: Never used  Substance and Sexual Activity   Alcohol use: Not Currently    Comment: occasionally   Drug use: No   Sexual activity: Yes    Birth control/protection: None  Other Topics Concern   Not on file  Social History Narrative   Not on file   Social Determinants of Health   Financial Resource Strain: Not on file  Food Insecurity: Not on file  Transportation Needs: Not  on file  Physical Activity: Not on file  Stress: Not on file  Social Connections: Not on file   Past Surgical History:  Procedure Laterality Date   ABDOMINAL AORTOGRAM W/LOWER EXTREMITY N/A 07/06/2019   Procedure: ABDOMINAL AORTOGRAM W/LOWER EXTREMITY;  Surgeon: Sherren Kerns, MD;  Location: [facility] INVASIVE CV LAB;  Service: Cardiovascular;  Laterality: N/A;  Bilateral   AMPUTATION Left 07/09/2019   Procedure: LEFT FOURTH and Fifth TOE AMPUTATION.;  Surgeon: Larina Earthly, MD;  Location: [facility] OR;  Service: Vascular;  Laterality: Left;   AMPUTATION Left 03/11/2021   Procedure: LEFT FOOT 5TH  AND 4TH RAY AMPUTATION;  Surgeon: Nadara Mustard, MD;  Location: [facility] OR;  Service: Orthopedics;  Laterality: Left;   AMPUTATION Left 08/28/2021   Procedure: AMPUTATION BELOW KNEE;  Surgeon: Nadara Mustard, MD;  Location: [facility] OR;  Service: Orthopedics;  Laterality: Left;   AORTIC VALVE REPLACEMENT N/A 12/05/2019   Procedure: PARTIAL STERNOTOMY FOR AORTIC VALVE REPLACEMENT (AVR), USING CARBOMEDICS SUPRA-ANNULAR TOP HAT ;  Surgeon: Purcell Nails, MD;  Location: [facility] OR;  Service: Open Heart Surgery;  Laterality: N/A;  No neck lines on left   CARDIAC VALVE REPLACEMENT N/A    Phreesia 09/27/2020   IR RADIOLOGY PERIPHERAL GUIDED IV START  10/05/2019   IR US GUIDE VASC ACCESS RIGHT  10/05/2019   MULTIPLE EXTRACTIONS WITH ALVEOLOPLASTY N/A 10/26/2019   Procedure: EXTRACTION OF TOOTH #'S 3, 5-11,19-28,  AND 32 WITH ALVEOLOPLASTY;  Surgeon: Charlynne Pander, DDS;  Location: [facility] OR;  Service: Oral Surgery;  Laterality: N/A;   RIGHT HEART CATH AND CORONARY ANGIOGRAPHY N/A 09/24/2019   Procedure: RIGHT HEART CATH AND CORONARY ANGIOGRAPHY;  Surgeon: Tonny Bollman, MD;  Location: [facility] INVASIVE CV LAB;  Service: Cardiovascular;  Laterality: N/A;   TEE WITHOUT CARDIOVERSION N/A 12/05/2019   Procedure: TRANSESOPHAGEAL ECHOCARDIOGRAM (TEE);  Surgeon: Purcell Nails, MD;  Location: [facility] OR;  Service: Open Heart Surgery;   Laterality: N/A;   TEE WITHOUT CARDIOVERSION N/A 09/01/2021   Procedure: TRANSESOPHAGEAL ECHOCARDIOGRAM (TEE);  Surgeon: Little Ishikawa, MD;  Location: [facility] ENDOSCOPY;  Service: Cardiovascular;  Laterality: N/A;   Past Medical History:  Diagnosis Date   Anxiety    Aortic stenosis    Cellulitis and abscess of lower extremity 06/11/2019   Cellulitis of fourth toe of left foot    Cholelithiasis    Coronary artery disease    Nonobstructive CAD (40-50% LAD) 08/2019   Depression    Depression    Phreesia 09/27/2020   Depression    Phreesia 11/01/2020   Diabetes mellitus without complication (HCC)    Phreesia 09/27/2020   Elevated troponin level not due myocardial infarction 11/11/2019   Essential hypertension    Gangrene of toe of left foot (HCC) 07/06/2019  Heart murmur    Phreesia 09/27/2020   Hyperlipidemia    Phreesia 09/27/2020   Hypertension    Phreesia 09/27/2020   Mixed hyperlipidemia    Morbid obesity (HCC)    S/P aortic valve replacement with mechanical valve 12/05/2019   25 mm Carbomedics top hat bileaflet mechanical valve via partial upper hemi-sternotomy   Severe aortic stenosis 09/24/2019   Type 2 diabetes mellitus (HCC)    BP (!) 114/3    Pulse 94    Temp 98.1 F (36.7 C) (Oral)    Ht 5\' 10"  (1.778 m)    Wt (!) 340 lb (154.2 kg)    SpO2 97%    BMI 48.78 kg/m   Opioid Risk Score:   Fall Risk Score:  `1  Depression screen PHQ 2/9  Depression screen [facility] 2/9 06/04/2021 03/02/2021 02/02/2021 11/04/2020 09/30/2020 09/18/2019 03/16/2018  Decreased Interest 0 0 0 0 1 0 0  Down, Depressed, Hopeless 0 0 0 0 1 0 0  PHQ - 2 Score 0 0 0 0 2 0 0  Altered sleeping - - - 0 1 - -  Tired, decreased energy - - - 0 1 - -  Change in appetite - - - 0 0 - -  Feeling bad or failure about yourself  - - - 0 0 - -  Trouble concentrating - - - 1 1 - -  Moving slowly or fidgety/restless - - - 0 0 - -  Suicidal thoughts - - - 0 0 - -  PHQ-9 Score - - - 1 5 - -  Some recent data might  be hidden     Review of Systems  Musculoskeletal:        Bilateral knee  Neurological:  Positive for weakness.  Psychiatric/Behavioral:  Positive for dysphoric mood. The patient is nervous/anxious.   All other systems reviewed and are negative.     Objective:   Physical Exam Vitals and nursing note reviewed.  Constitutional:      Appearance: Normal appearance.  Cardiovascular:     Rate and Rhythm: Normal rate and regular rhythm.     Pulses: Normal pulses.     Heart sounds: Normal heart sounds.  Pulmonary:     Effort: Pulmonary effort is normal.     Breath sounds: Normal breath sounds.  Musculoskeletal:     Cervical back: Normal range of motion and neck supple.     Comments: Normal Muscle Bulk and Muscle Testing Reveals:  Upper Extremities: Full ROM and Muscle Strength 5/5 Lower Extremities Right : Full ROM and Muscle Strength 5/5 Left Lower Extremity: Left BKA Arrived in wheelchair      Skin:    General: Skin is warm and dry.     Comments: Left BKA: Stump with serous drainage and odor. He has a F/U appointment with Dr 03/18/2018  Neurological:     Mental Status: He is alert and oriented to person, place, and time.  Psychiatric:        Mood and Affect: Mood normal.        Behavior: Behavior normal.         Assessment & Plan:  1.Below-Knee Amputation: S/P  : see below on 08/28/2022: Dr 14/10/2021 AMPUTATION BELOW KNEE Left  2.Essential Hypertension. Continue curren medication regimen. PCP Following. Continue to Monitor.  3. Phantom Pain: Continue Gabapentin. Continue to Monitor.   F/U in 4-6 weeks

## 2021-10-15 ENCOUNTER — Ambulatory Visit: Payer: 59 | Admitting: Nurse Practitioner

## 2021-10-15 ENCOUNTER — Ambulatory Visit (INDEPENDENT_AMBULATORY_CARE_PROVIDER_SITE_OTHER): Payer: 59 | Admitting: *Deleted

## 2021-10-15 ENCOUNTER — Encounter: Payer: Self-pay | Admitting: Registered Nurse

## 2021-10-15 DIAGNOSIS — Z5181 Encounter for therapeutic drug level monitoring: Secondary | ICD-10-CM | POA: Diagnosis not present

## 2021-10-15 DIAGNOSIS — Z952 Presence of prosthetic heart valve: Secondary | ICD-10-CM | POA: Diagnosis not present

## 2021-10-15 DIAGNOSIS — Z954 Presence of other heart-valve replacement: Secondary | ICD-10-CM

## 2021-10-15 LAB — POCT INR: INR: 5 — AB (ref 2.0–3.0)

## 2021-10-15 NOTE — Patient Instructions (Signed)
Hold warfarin tonight then decrease dose to 1 tablet daily Recheck in 1 wk

## 2021-10-16 ENCOUNTER — Encounter: Payer: Self-pay | Admitting: Family

## 2021-10-16 ENCOUNTER — Ambulatory Visit (INDEPENDENT_AMBULATORY_CARE_PROVIDER_SITE_OTHER): Payer: 59 | Admitting: Family

## 2021-10-16 DIAGNOSIS — Z89512 Acquired absence of left leg below knee: Secondary | ICD-10-CM

## 2021-10-16 DIAGNOSIS — T8781 Dehiscence of amputation stump: Secondary | ICD-10-CM

## 2021-10-16 DIAGNOSIS — S88112A Complete traumatic amputation at level between knee and ankle, left lower leg, initial encounter: Secondary | ICD-10-CM

## 2021-10-16 MED ORDER — DOXYCYCLINE HYCLATE 100 MG PO TABS: 100.0000 mg | ORAL_TABLET | Freq: Two times a day (BID) | ORAL | 0 refills | Status: DC

## 2021-10-16 NOTE — Progress Notes (Signed)
Post-Op Visit Note   Patient: Adam Long           Date of Birth: 08-Feb-1976           MRN: [id number] Visit Date: 09/23/2021 PCP: Heather Roberts, NP  Chief Complaint:  Chief Complaint  Patient presents with   Left Leg - Routine Post Op    Left BKA 08/28/21    HPI:  HPI The patient is a 46 year old gentleman seen status post left below-knee amputation on December 2.  Has an area of dehiscence now with drainage. Denies systemic symptoms Ortho Exam On examination of the residual limb there improved/reduced edema.  Unfortunately laterally there is an area that has opened up this is dime sized and probes 15 mm deep there is purulent drainage with a foul odor today there is no surrounding erythema no warmth   Visit Diagnoses:  1. Below-knee amputation of left lower extremity (HCC)     Plan: Concern for area of dehiscence with purulent drainage.  We will place him on doxycycline and have instructed him in packing this open.  We will reevaluate in office in 3 to 4 days.  Discussed return precautions  Follow-Up Instructions: No follow-ups on file.   Imaging: No results found.  Orders:  No orders of the defined types were placed in this encounter.  No orders of the defined types were placed in this encounter.    PMFS History: Patient Active Problem List   Diagnosis Date Noted   Below-knee amputation of left lower extremity (HCC) 09/02/2021   Severe protein-calorie malnutrition (HCC)    MSSA bacteremia    Ulcer of left foot (HCC)    Subacute osteomyelitis, left ankle and foot (HCC)    Diabetic foot infection (HCC) 02/14/2021   Diabetic ulcer of left foot associated with type 1 diabetes mellitus, limited to breakdown of skin (HCC)    Leukocytosis 02/13/2021   Hyponatremia 02/13/2021   Normocytic anemia 02/13/2021   Hyperglycemia due to diabetes mellitus (HCC) 02/13/2021   Open wound of left foot 02/12/2021   Left leg swelling 02/02/2021   History of complete ray  amputation of fifth toe of left foot (HCC) 11/04/2020   Immunization due 11/04/2020   Anxiety 09/30/2020   Preventative health care 12/13/2019   S/P aortic valve replacement with mechanical valve 12/05/2019   S/P AVR (aortic valve replacement) 12/05/2019   Syncope 11/10/2019   Personal history of noncompliance with medical treatment, presenting hazards to health 03/16/2018   Uncontrolled type 2 diabetes mellitus with complication 10/12/2015   Hyperlipidemia 10/12/2015   Morbid obesity (HCC) 10/12/2015   Chest pain 07/02/2013   HTN (hypertension) 07/02/2013   Past Medical History:  Diagnosis Date   Anxiety    Aortic stenosis    Cellulitis and abscess of lower extremity 06/11/2019   Cellulitis of fourth toe of left foot    Cholelithiasis    Coronary artery disease    Nonobstructive CAD (40-50% LAD) 08/2019   Depression    Depression    Phreesia 09/27/2020   Depression    Phreesia 11/01/2020   Diabetes mellitus without complication (HCC)    Phreesia 09/27/2020   Elevated troponin level not due myocardial infarction 11/11/2019   Essential hypertension    Gangrene of toe of left foot (HCC) 07/06/2019   Heart murmur    Phreesia 09/27/2020   Hyperlipidemia    Phreesia 09/27/2020   Hypertension    Phreesia 09/27/2020   Mixed hyperlipidemia    Morbid  obesity (HCC)    S/P aortic valve replacement with mechanical valve 12/05/2019   25 mm Carbomedics top hat bileaflet mechanical valve via partial upper hemi-sternotomy   Severe aortic stenosis 09/24/2019   Type 2 diabetes mellitus (HCC)     Family History  Problem Relation Age of Onset   Cancer Mother        Brain   Heart disease Father    Hyperlipidemia Father    Hypertension Father    Stroke Father    Heart murmur Sister     Past Surgical History:  Procedure Laterality Date   ABDOMINAL AORTOGRAM W/LOWER EXTREMITY N/A 07/06/2019   Procedure: ABDOMINAL AORTOGRAM W/LOWER EXTREMITY;  Surgeon: Sherren Kerns, MD;   Location: [facility] INVASIVE CV LAB;  Service: Cardiovascular;  Laterality: N/A;  Bilateral   AMPUTATION Left 07/09/2019   Procedure: LEFT FOURTH and Fifth TOE AMPUTATION.;  Surgeon: Larina Earthly, MD;  Location: [facility] OR;  Service: Vascular;  Laterality: Left;   AMPUTATION Left 03/11/2021   Procedure: LEFT FOOT 5TH  AND 4TH RAY AMPUTATION;  Surgeon: Nadara Mustard, MD;  Location: [facility] OR;  Service: Orthopedics;  Laterality: Left;   AMPUTATION Left 08/28/2021   Procedure: AMPUTATION BELOW KNEE;  Surgeon: Nadara Mustard, MD;  Location: [facility] OR;  Service: Orthopedics;  Laterality: Left;   AORTIC VALVE REPLACEMENT N/A 12/05/2019   Procedure: PARTIAL STERNOTOMY FOR AORTIC VALVE REPLACEMENT (AVR), USING CARBOMEDICS SUPRA-ANNULAR TOP HAT ;  Surgeon: Purcell Nails, MD;  Location: [facility] OR;  Service: Open Heart Surgery;  Laterality: N/A;  No neck lines on left   CARDIAC VALVE REPLACEMENT N/A    Phreesia 09/27/2020   IR RADIOLOGY PERIPHERAL GUIDED IV START  10/05/2019   IR US GUIDE VASC ACCESS RIGHT  10/05/2019   MULTIPLE EXTRACTIONS WITH ALVEOLOPLASTY N/A 10/26/2019   Procedure: EXTRACTION OF TOOTH #'S 3, 5-11,19-28,  AND 32 WITH ALVEOLOPLASTY;  Surgeon: Charlynne Pander, DDS;  Location: [facility] OR;  Service: Oral Surgery;  Laterality: N/A;   RIGHT HEART CATH AND CORONARY ANGIOGRAPHY N/A 09/24/2019   Procedure: RIGHT HEART CATH AND CORONARY ANGIOGRAPHY;  Surgeon: Tonny Bollman, MD;  Location: [facility] INVASIVE CV LAB;  Service: Cardiovascular;  Laterality: N/A;   TEE WITHOUT CARDIOVERSION N/A 12/05/2019   Procedure: TRANSESOPHAGEAL ECHOCARDIOGRAM (TEE);  Surgeon: Purcell Nails, MD;  Location: [facility] OR;  Service: Open Heart Surgery;  Laterality: N/A;   TEE WITHOUT CARDIOVERSION N/A 09/01/2021   Procedure: TRANSESOPHAGEAL ECHOCARDIOGRAM (TEE);  Surgeon: Little Ishikawa, MD;  Location: [facility] ENDOSCOPY;  Service: Cardiovascular;  Laterality: N/A;   Social History   Occupational History    Comment: Glass blower/designer-  self-employed  Tobacco Use   Smoking status: Never   Smokeless tobacco: Former    Types: Chew    Quit date: 2021  Vaping Use   Vaping Use: Never used  Substance and Sexual Activity   Alcohol use: Not Currently    Comment: occasionally   Drug use: No   Sexual activity: Yes    Birth control/protection: None

## 2021-10-19 ENCOUNTER — Ambulatory Visit (INDEPENDENT_AMBULATORY_CARE_PROVIDER_SITE_OTHER): Payer: 59 | Admitting: Nurse Practitioner

## 2021-10-19 ENCOUNTER — Encounter: Payer: Self-pay | Admitting: Nurse Practitioner

## 2021-10-19 ENCOUNTER — Other Ambulatory Visit: Payer: Self-pay

## 2021-10-19 VITALS — BP 136/82 | HR 99 | Resp 16 | Ht 70.0 in

## 2021-10-19 DIAGNOSIS — S88112A Complete traumatic amputation at level between knee and ankle, left lower leg, initial encounter: Secondary | ICD-10-CM

## 2021-10-19 DIAGNOSIS — E1165 Type 2 diabetes mellitus with hyperglycemia: Secondary | ICD-10-CM | POA: Diagnosis not present

## 2021-10-19 DIAGNOSIS — I10 Essential (primary) hypertension: Secondary | ICD-10-CM

## 2021-10-19 DIAGNOSIS — E785 Hyperlipidemia, unspecified: Secondary | ICD-10-CM

## 2021-10-19 DIAGNOSIS — E1169 Type 2 diabetes mellitus with other specified complication: Secondary | ICD-10-CM

## 2021-10-19 MED ORDER — METFORMIN HCL 500 MG PO TABS: 500.0000 mg | ORAL_TABLET | Freq: Two times a day (BID) | ORAL | 3 refills | Status: DC

## 2021-10-19 NOTE — Patient Instructions (Addendum)
Please schedule pt for diabetic eye exam, patient needs a handicap sticker. Please get your covid booster at your pharmacy Please take metformin 500 mg 2 times daily for your diabetes. You have been referred to podiatry for your foot care   It is important that you exercise regularly at least 30 minutes 5 times a week.  Think about what you will eat, plan ahead. Choose " clean, green, fresh or frozen" over canned, processed or packaged foods which are more sugary, salty and fatty. 70 to 75% of food eaten should be vegetables and fruit. Three meals at set times with snacks allowed between meals, but they must be fruit or vegetables. Aim to eat over a 12 hour period , example 7 am to 7 pm, and STOP after  your last meal of the day. Drink water,generally about 64 ounces per day, no other drink is as healthy. Fruit juice is best enjoyed in a healthy way, by EATING the fruit.  Thanks for choosing [facility], we consider it a privelige to serve you.

## 2021-10-19 NOTE — Assessment & Plan Note (Signed)
DASH diet and commitment to daily physical activity for a minimum of 30 minutes discussed and encouraged, as a part of hypertension management. The importance of attaining a healthy weight is also discussed.  BP/Weight 10/19/2021 10/14/2021 10/13/2021 09/14/2021 09/02/2021 09/01/2021 08/27/2021  Systolic BP 136 114 110 103 106 - -  Diastolic BP 82 3 74 61 69 - -  Wt. (Lbs) - 340 - 331.13 - 380.07 -  BMI 48.78 48.78 - 47.51 - - 54.53  lisinopril 10mg  daily Metoprolol 25mg  BID.

## 2021-10-19 NOTE — Assessment & Plan Note (Addendum)
Doing well post surgery Has a follow up with otho on 10/20/2021. On antibiotics. He was in the inpatient .rehab awhile in the hospital.  He is able to walk with walker at home

## 2021-10-19 NOTE — Assessment & Plan Note (Addendum)
Lipid panel ordered.  Pt has eaten today.he will get labs done tomorrow.  Lab Results  Component Value Date   CHOL 150 06/04/2021   HDL 40 06/04/2021   LDLCALC 88 06/04/2021   TRIG 122 06/04/2021   CHOLHDL 3.4 07/17/2020

## 2021-10-19 NOTE — Assessment & Plan Note (Signed)
Importance of healthy food choices with portion control discussed as well as eating regularly within 12  hour window.  ° °The need to choose clean green food 50%-75% of time is discussed as well as make water the primary drink and set a goal for 64 ounces daily. ° °Patient reeducated about the importance of committment to minimum of 150 minutes of exercise per week. ° °Three meals at set times with snacks allowed between meals but they must be fruit or vegetable.  ° °Aim to eat  over 12 hour period  for example 7 am to 7 pm. Stop after your last meal of the day.  °

## 2021-10-19 NOTE — Progress Notes (Signed)
° °  Adam Long     MRN: [id number]      DOB: September 18, 1976   HPI Adam Long is here for follow up and re-evaluation of chronic medical conditions, medication management and review of any available recent lab and radiology data.  Preventive health is updated, specifically  Cancer screening and Immunization.   Questions or concerns regarding consultations or procedures which the PT has had in the interim are  addressed. The PT denies any adverse reactions to current medications since the last visit.  There are no new concerns.  There are no specific complaints   He recently had BKA. In Dec 2022.States he has a little spot that is infected on his surgical wound, has an appointment with otho surgeon tomorrow. Plans on getting a prosthetic so he can go back to work. He is part of support group on FB for people that have had leg amputation, the support group helps cope well.    He needs a paper for handicap parking.  Due for diabetic and  eye exam, foot exam.  .  ROS Denies recent fever or chills. Denies sinus pressure, nasal congestion, ear pain or sore throat. Denies chest congestion, productive cough or wheezing. Denies chest pains, palpitations and leg swelling Denies abdominal pain, nausea, vomiting,diarrhea or constipation.   Uses a walker and wheelchair at home, LBKA wrapped with compression dressing.  Denies depression, anxiety or insomnia.    PE  BP 136/82    Pulse 99    Resp 16    Ht 5\' 10"  (1.778 m)    SpO2 97%    BMI 48.78 kg/m   Patient alert and oriented and in no cardiopulmonary distress.   Chest: Clear to auscultation bilaterally.  CVS: S1, S2 no murmurs, no S3.Regular rate.  ABD: Soft non tender.   MS: Adequate ROM spine, shoulders, has left BKA, stump wrapped in compression dressing. Unable to access surgical site due to dressing.   Psych: Good eye contact, normal affect. Memory intact not anxious or depressed appearing.     Assessment & Plan

## 2021-10-19 NOTE — Assessment & Plan Note (Addendum)
Lab Results  Component Value Date   HGBA1C 7.8 (H) 08/28/2021   Start metformin 500 mg 2 times daily. Patient was previously taking metformin 250 mg daily. Foot exam completed today, patient referred to podiatry for foot care. Patient is due for diabetic eye exam referral made today.

## 2021-10-20 ENCOUNTER — Ambulatory Visit: Payer: 59

## 2021-10-20 ENCOUNTER — Encounter: Payer: Self-pay | Admitting: Family

## 2021-10-20 ENCOUNTER — Ambulatory Visit (INDEPENDENT_AMBULATORY_CARE_PROVIDER_SITE_OTHER): Payer: 59 | Admitting: Family

## 2021-10-20 DIAGNOSIS — S88112A Complete traumatic amputation at level between knee and ankle, left lower leg, initial encounter: Secondary | ICD-10-CM

## 2021-10-20 DIAGNOSIS — Z89512 Acquired absence of left leg below knee: Secondary | ICD-10-CM

## 2021-10-20 NOTE — Progress Notes (Signed)
Post-Op Visit Note   Patient: Adam Long           Date of Birth: 18-Jul-1976           MRN: [id number] Visit Date: 10/20/2021 PCP: Heather Roberts, NP  Chief Complaint: No chief complaint on file.   HPI:  HPI The patient is a 46 year old gentleman seen today for wound check. Was last seen on Friday for post op. Had an area of dehiscence. tHis was packed open with gauze. Denies fever or chills Ortho Exam On examination of left residual limb the area laterally that had been having significant drainage has decreased in size and depth.  This is now about 1 cm in diameter there is only depth on the most lateral aspect.  There is no active drainage today there is granulation in the majority of wound bed there is mild surrounding erythema there is no warmth  Visit Diagnoses: No diagnosis found.  Plan: Discussed strict return precautions.  We will take 1 more look on Friday.  He will continue packing the wound open after daily Dial soap cleansing  Follow-Up Instructions: No follow-ups on file.   Imaging: No results found.  Orders:  No orders of the defined types were placed in this encounter.  No orders of the defined types were placed in this encounter.    PMFS History: Patient Active Problem List   Diagnosis Date Noted   Below-knee amputation of left lower extremity (HCC) 09/02/2021   Severe protein-calorie malnutrition (HCC)    MSSA bacteremia    Ulcer of left foot (HCC)    Subacute osteomyelitis, left ankle and foot (HCC)    Diabetic foot infection (HCC) 02/14/2021   Diabetic ulcer of left foot associated with type 1 diabetes mellitus, limited to breakdown of skin (HCC)    Leukocytosis 02/13/2021   Hyponatremia 02/13/2021   Normocytic anemia 02/13/2021   Hyperglycemia due to diabetes mellitus (HCC) 02/13/2021   Open wound of left foot 02/12/2021   Left leg swelling 02/02/2021   History of complete ray amputation of fifth toe of left foot (HCC) 11/04/2020    Immunization due 11/04/2020   Anxiety 09/30/2020   Preventative health care 12/13/2019   S/P aortic valve replacement with mechanical valve 12/05/2019   S/P AVR (aortic valve replacement) 12/05/2019   Syncope 11/10/2019   Personal history of noncompliance with medical treatment, presenting hazards to health 03/16/2018   Uncontrolled diabetes mellitus with hyperglycemia, without long-term current use of insulin (HCC) 10/12/2015   Hyperlipidemia associated with type 2 diabetes mellitus (HCC) 10/12/2015   Morbid obesity (HCC) 10/12/2015   Chest pain 07/02/2013   HTN (hypertension) 07/02/2013   Past Medical History:  Diagnosis Date   Anxiety    Aortic stenosis    Cellulitis and abscess of lower extremity 06/11/2019   Cellulitis of fourth toe of left foot    Cholelithiasis    Coronary artery disease    Nonobstructive CAD (40-50% LAD) 08/2019   Depression    Depression    Phreesia 09/27/2020   Depression    Phreesia 11/01/2020   Diabetes mellitus without complication (HCC)    Phreesia 09/27/2020   Elevated troponin level not due myocardial infarction 11/11/2019   Essential hypertension    Gangrene of toe of left foot (HCC) 07/06/2019   Heart murmur    Phreesia 09/27/2020   Hyperlipidemia    Phreesia 09/27/2020   Hypertension    Phreesia 09/27/2020   Mixed hyperlipidemia  Morbid obesity (HCC)    S/P aortic valve replacement with mechanical valve 12/05/2019   25 mm Carbomedics top hat bileaflet mechanical valve via partial upper hemi-sternotomy   Severe aortic stenosis 09/24/2019   Type 2 diabetes mellitus (HCC)     Family History  Problem Relation Age of Onset   Cancer Mother        Brain   Heart disease Father    Hyperlipidemia Father    Hypertension Father    Stroke Father    Heart murmur Sister     Past Surgical History:  Procedure Laterality Date   ABDOMINAL AORTOGRAM W/LOWER EXTREMITY N/A 07/06/2019   Procedure: ABDOMINAL AORTOGRAM W/LOWER EXTREMITY;  Surgeon:  Sherren Kerns, MD;  Location: [facility] INVASIVE CV LAB;  Service: Cardiovascular;  Laterality: N/A;  Bilateral   AMPUTATION Left 07/09/2019   Procedure: LEFT FOURTH and Fifth TOE AMPUTATION.;  Surgeon: Larina Earthly, MD;  Location: [facility] OR;  Service: Vascular;  Laterality: Left;   AMPUTATION Left 03/11/2021   Procedure: LEFT FOOT 5TH  AND 4TH RAY AMPUTATION;  Surgeon: Nadara Mustard, MD;  Location: [facility] OR;  Service: Orthopedics;  Laterality: Left;   AMPUTATION Left 08/28/2021   Procedure: AMPUTATION BELOW KNEE;  Surgeon: Nadara Mustard, MD;  Location: [facility] OR;  Service: Orthopedics;  Laterality: Left;   AORTIC VALVE REPLACEMENT N/A 12/05/2019   Procedure: PARTIAL STERNOTOMY FOR AORTIC VALVE REPLACEMENT (AVR), USING CARBOMEDICS SUPRA-ANNULAR TOP HAT ;  Surgeon: Purcell Nails, MD;  Location: [facility] OR;  Service: Open Heart Surgery;  Laterality: N/A;  No neck lines on left   CARDIAC VALVE REPLACEMENT N/A    Phreesia 09/27/2020   IR RADIOLOGY PERIPHERAL GUIDED IV START  10/05/2019   IR US GUIDE VASC ACCESS RIGHT  10/05/2019   MULTIPLE EXTRACTIONS WITH ALVEOLOPLASTY N/A 10/26/2019   Procedure: EXTRACTION OF TOOTH #'S 3, 5-11,19-28,  AND 32 WITH ALVEOLOPLASTY;  Surgeon: Charlynne Pander, DDS;  Location: [facility] OR;  Service: Oral Surgery;  Laterality: N/A;   RIGHT HEART CATH AND CORONARY ANGIOGRAPHY N/A 09/24/2019   Procedure: RIGHT HEART CATH AND CORONARY ANGIOGRAPHY;  Surgeon: Tonny Bollman, MD;  Location: [facility] INVASIVE CV LAB;  Service: Cardiovascular;  Laterality: N/A;   TEE WITHOUT CARDIOVERSION N/A 12/05/2019   Procedure: TRANSESOPHAGEAL ECHOCARDIOGRAM (TEE);  Surgeon: Purcell Nails, MD;  Location: [facility] OR;  Service: Open Heart Surgery;  Laterality: N/A;   TEE WITHOUT CARDIOVERSION N/A 09/01/2021   Procedure: TRANSESOPHAGEAL ECHOCARDIOGRAM (TEE);  Surgeon: Little Ishikawa, MD;  Location: [facility] ENDOSCOPY;  Service: Cardiovascular;  Laterality: N/A;   Social History   Occupational History    Comment:  Glass blower/designer- self-employed  Tobacco Use   Smoking status: Never   Smokeless tobacco: Former    Types: Chew    Quit date: 2021  Vaping Use   Vaping Use: Never used  Substance and Sexual Activity   Alcohol use: Not Currently    Comment: occasionally   Drug use: No   Sexual activity: Yes    Birth control/protection: None

## 2021-10-21 ENCOUNTER — Other Ambulatory Visit: Payer: Self-pay | Admitting: Physical Medicine and Rehabilitation

## 2021-10-21 DIAGNOSIS — F419 Anxiety disorder, unspecified: Secondary | ICD-10-CM

## 2021-10-22 ENCOUNTER — Ambulatory Visit (INDEPENDENT_AMBULATORY_CARE_PROVIDER_SITE_OTHER): Payer: 59 | Admitting: *Deleted

## 2021-10-22 DIAGNOSIS — Z952 Presence of prosthetic heart valve: Secondary | ICD-10-CM | POA: Diagnosis not present

## 2021-10-22 DIAGNOSIS — Z5181 Encounter for therapeutic drug level monitoring: Secondary | ICD-10-CM | POA: Diagnosis not present

## 2021-10-22 LAB — POCT INR: INR: 3.1 — AB (ref 2.0–3.0)

## 2021-10-22 NOTE — Patient Instructions (Signed)
On Doxycycline 100mg  bid x 30 days Decrease warfarin to 1 tablet daily except 1/2 tablet on Thursdays Recheck in 2 wk

## 2021-10-23 ENCOUNTER — Other Ambulatory Visit: Payer: Self-pay

## 2021-10-23 ENCOUNTER — Ambulatory Visit (INDEPENDENT_AMBULATORY_CARE_PROVIDER_SITE_OTHER): Payer: 59 | Admitting: Family

## 2021-10-23 DIAGNOSIS — Z89512 Acquired absence of left leg below knee: Secondary | ICD-10-CM

## 2021-10-23 DIAGNOSIS — S88112A Complete traumatic amputation at level between knee and ankle, left lower leg, initial encounter: Secondary | ICD-10-CM

## 2021-10-23 DIAGNOSIS — T8781 Dehiscence of amputation stump: Secondary | ICD-10-CM

## 2021-10-27 ENCOUNTER — Other Ambulatory Visit: Payer: Self-pay

## 2021-10-27 ENCOUNTER — Ambulatory Visit (INDEPENDENT_AMBULATORY_CARE_PROVIDER_SITE_OTHER): Payer: 59 | Admitting: Orthopedic Surgery

## 2021-10-27 DIAGNOSIS — Z89512 Acquired absence of left leg below knee: Secondary | ICD-10-CM

## 2021-10-27 DIAGNOSIS — S88112A Complete traumatic amputation at level between knee and ankle, left lower leg, initial encounter: Secondary | ICD-10-CM

## 2021-10-27 DIAGNOSIS — M86262 Subacute osteomyelitis, left tibia and fibula: Secondary | ICD-10-CM

## 2021-10-28 ENCOUNTER — Ambulatory Visit (INDEPENDENT_AMBULATORY_CARE_PROVIDER_SITE_OTHER): Payer: 59 | Admitting: Podiatry

## 2021-10-28 ENCOUNTER — Encounter: Payer: Self-pay | Admitting: Podiatry

## 2021-10-28 ENCOUNTER — Encounter: Payer: Self-pay | Admitting: Family

## 2021-10-28 DIAGNOSIS — M79674 Pain in right toe(s): Secondary | ICD-10-CM | POA: Diagnosis not present

## 2021-10-28 DIAGNOSIS — E1165 Type 2 diabetes mellitus with hyperglycemia: Secondary | ICD-10-CM | POA: Diagnosis not present

## 2021-10-28 DIAGNOSIS — B351 Tinea unguium: Secondary | ICD-10-CM

## 2021-10-28 DIAGNOSIS — S88112A Complete traumatic amputation at level between knee and ankle, left lower leg, initial encounter: Secondary | ICD-10-CM | POA: Diagnosis not present

## 2021-10-28 NOTE — Progress Notes (Signed)
Post-Op Visit Note   Patient: Adam Long           Date of Birth: 04-20-1976           MRN: [id number] Visit Date: 10/23/2021 PCP: Heather Roberts, NP  Chief Complaint:  Chief Complaint  Patient presents with   Left Leg - Routine Post Op    08/28/21 left BKA      HPI:  HPI The patient is a 46 year old gentleman seen today for wound check. Had an area of dehiscence. tHis was packed open with gauze. Denies fever or chills  Ortho Exam On examination of left residual limb the area laterally that had been having significant drainage has decreased in size and depth. This is stable from last visit. This is now about 1 cm in diameter there is only depth on the most lateral aspect.  There is no active drainage today there is granulation in the majority of wound bed there is mild surrounding erythema there is no warmth  Visit Diagnoses: No diagnosis found.  Plan: Discussed strict return precautions.  He will continue packing the wound open after daily Dial soap cleansing. He will see Dr. Lajoyce Corners next week and we'll decide about surgery at that point.  Follow-Up Instructions: No follow-ups on file.   Imaging: No results found.  Orders:  No orders of the defined types were placed in this encounter.  No orders of the defined types were placed in this encounter.    PMFS History: Patient Active Problem List   Diagnosis Date Noted   Below-knee amputation of left lower extremity (HCC) 09/02/2021   Severe protein-calorie malnutrition (HCC)    MSSA bacteremia    Ulcer of left foot (HCC)    Subacute osteomyelitis, left ankle and foot (HCC)    Diabetic foot infection (HCC) 02/14/2021   Diabetic ulcer of left foot associated with type 1 diabetes mellitus, limited to breakdown of skin (HCC)    Leukocytosis 02/13/2021   Hyponatremia 02/13/2021   Normocytic anemia 02/13/2021   Hyperglycemia due to diabetes mellitus (HCC) 02/13/2021   Open wound of left foot 02/12/2021   Left leg  swelling 02/02/2021   History of complete ray amputation of fifth toe of left foot (HCC) 11/04/2020   Immunization due 11/04/2020   Anxiety 09/30/2020   Preventative health care 12/13/2019   S/P aortic valve replacement with mechanical valve 12/05/2019   S/P AVR (aortic valve replacement) 12/05/2019   Syncope 11/10/2019   Personal history of noncompliance with medical treatment, presenting hazards to health 03/16/2018   Uncontrolled diabetes mellitus with hyperglycemia, without long-term current use of insulin (HCC) 10/12/2015   Hyperlipidemia associated with type 2 diabetes mellitus (HCC) 10/12/2015   Morbid obesity (HCC) 10/12/2015   Chest pain 07/02/2013   HTN (hypertension) 07/02/2013   Past Medical History:  Diagnosis Date   Anxiety    Aortic stenosis    Cellulitis and abscess of lower extremity 06/11/2019   Cellulitis of fourth toe of left foot    Cholelithiasis    Coronary artery disease    Nonobstructive CAD (40-50% LAD) 08/2019   Depression    Depression    Phreesia 09/27/2020   Depression    Phreesia 11/01/2020   Diabetes mellitus without complication (HCC)    Phreesia 09/27/2020   Elevated troponin level not due myocardial infarction 11/11/2019   Essential hypertension    Gangrene of toe of left foot (HCC) 07/06/2019   Heart murmur    Phreesia 09/27/2020  Hyperlipidemia    Phreesia 09/27/2020   Hypertension    Phreesia 09/27/2020   Mixed hyperlipidemia    Morbid obesity (HCC)    S/P aortic valve replacement with mechanical valve 12/05/2019   25 mm Carbomedics top hat bileaflet mechanical valve via partial upper hemi-sternotomy   Severe aortic stenosis 09/24/2019   Type 2 diabetes mellitus (HCC)     Family History  Problem Relation Age of Onset   Cancer Mother        Brain   Heart disease Father    Hyperlipidemia Father    Hypertension Father    Stroke Father    Heart murmur Sister     Past Surgical History:  Procedure Laterality Date   ABDOMINAL  AORTOGRAM W/LOWER EXTREMITY N/A 07/06/2019   Procedure: ABDOMINAL AORTOGRAM W/LOWER EXTREMITY;  Surgeon: Sherren Kerns, MD;  Location: [facility] INVASIVE CV LAB;  Service: Cardiovascular;  Laterality: N/A;  Bilateral   AMPUTATION Left 07/09/2019   Procedure: LEFT FOURTH and Fifth TOE AMPUTATION.;  Surgeon: Larina Earthly, MD;  Location: [facility] OR;  Service: Vascular;  Laterality: Left;   AMPUTATION Left 03/11/2021   Procedure: LEFT FOOT 5TH  AND 4TH RAY AMPUTATION;  Surgeon: Nadara Mustard, MD;  Location: [facility] OR;  Service: Orthopedics;  Laterality: Left;   AMPUTATION Left 08/28/2021   Procedure: AMPUTATION BELOW KNEE;  Surgeon: Nadara Mustard, MD;  Location: [facility] OR;  Service: Orthopedics;  Laterality: Left;   AORTIC VALVE REPLACEMENT N/A 12/05/2019   Procedure: PARTIAL STERNOTOMY FOR AORTIC VALVE REPLACEMENT (AVR), USING CARBOMEDICS SUPRA-ANNULAR TOP HAT ;  Surgeon: Purcell Nails, MD;  Location: [facility] OR;  Service: Open Heart Surgery;  Laterality: N/A;  No neck lines on left   CARDIAC VALVE REPLACEMENT N/A    Phreesia 09/27/2020   IR RADIOLOGY PERIPHERAL GUIDED IV START  10/05/2019   IR US GUIDE VASC ACCESS RIGHT  10/05/2019   MULTIPLE EXTRACTIONS WITH ALVEOLOPLASTY N/A 10/26/2019   Procedure: EXTRACTION OF TOOTH #'S 3, 5-11,19-28,  AND 32 WITH ALVEOLOPLASTY;  Surgeon: Charlynne Pander, DDS;  Location: [facility] OR;  Service: Oral Surgery;  Laterality: N/A;   RIGHT HEART CATH AND CORONARY ANGIOGRAPHY N/A 09/24/2019   Procedure: RIGHT HEART CATH AND CORONARY ANGIOGRAPHY;  Surgeon: Tonny Bollman, MD;  Location: [facility] INVASIVE CV LAB;  Service: Cardiovascular;  Laterality: N/A;   TEE WITHOUT CARDIOVERSION N/A 12/05/2019   Procedure: TRANSESOPHAGEAL ECHOCARDIOGRAM (TEE);  Surgeon: Purcell Nails, MD;  Location: [facility] OR;  Service: Open Heart Surgery;  Laterality: N/A;   TEE WITHOUT CARDIOVERSION N/A 09/01/2021   Procedure: TRANSESOPHAGEAL ECHOCARDIOGRAM (TEE);  Surgeon: Little Ishikawa, MD;  Location: [facility] ENDOSCOPY;   Service: Cardiovascular;  Laterality: N/A;   Social History   Occupational History    Comment: Glass blower/designer- self-employed  Tobacco Use   Smoking status: Never   Smokeless tobacco: Former    Types: Chew    Quit date: 2021  Vaping Use   Vaping Use: Never used  Substance and Sexual Activity   Alcohol use: Not Currently    Comment: occasionally   Drug use: No   Sexual activity: Yes    Birth control/protection: None

## 2021-10-28 NOTE — Progress Notes (Signed)
°  Subjective:  Patient ID: Adam Long, male    DOB: August 11, 1976,   MRN: [id number]  Chief Complaint  Patient presents with   Debridement    Requesting nail trim right only - amputation left lower extremity in Dec 2022   Diabetes    Last a1c was 7.8    46 y.o. male presents for concern of thickened elongated and painful toenails on the right. Patient has a history of left BKA. He is unable to trim nails himself. He is diabetic and last A1c was 7.8. . Denies any other pedal complaints. Denies n/v/f/c.   PCP: Bjorn Pippin NP   Past Medical History:  Diagnosis Date   Anxiety    Aortic stenosis    Cellulitis and abscess of lower extremity 06/11/2019   Cellulitis of fourth toe of left foot    Cholelithiasis    Coronary artery disease    Nonobstructive CAD (40-50% LAD) 08/2019   Depression    Depression    Phreesia 09/27/2020   Depression    Phreesia 11/01/2020   Diabetes mellitus without complication (HCC)    Phreesia 09/27/2020   Elevated troponin level not due myocardial infarction 11/11/2019   Essential hypertension    Gangrene of toe of left foot (HCC) 07/06/2019   Heart murmur    Phreesia 09/27/2020   Hyperlipidemia    Phreesia 09/27/2020   Hypertension    Phreesia 09/27/2020   Mixed hyperlipidemia    Morbid obesity (HCC)    S/P aortic valve replacement with mechanical valve 12/05/2019   25 mm Carbomedics top hat bileaflet mechanical valve via partial upper hemi-sternotomy   Severe aortic stenosis 09/24/2019   Type 2 diabetes mellitus (HCC)     Objective:  Physical Exam: Vascular: DP/PT pulses 2/4 bilateral. CFT <3 seconds. Normal hair growth on digits. No edema.  Skin. No lacerations or abrasions bilateral feet. Left BKA. Right nails 1-5 thickened elongated and with subungual debris.  Musculoskeletal: MMT 5/5 bilateral lower extremities in DF, PF, Inversion and Eversion. Deceased ROM in DF of ankle joint.  Neurological: Sensation intact to light touch.    Assessment:   1. Uncontrolled diabetes mellitus with hyperglycemia, without long-term current use of insulin (HCC)   2. Below-knee amputation of left lower extremity (HCC)   3. Pain due to onychomycosis of toenail of right foot      Plan:  Patient was evaluated and treated and all questions answered. -Discussed and educated patient on diabetic foot care, especially with  regards to the vascular, neurological and musculoskeletal systems.  -Stressed the importance of good glycemic control and the detriment of not  controlling glucose levels in relation to the foot. -Discussed supportive shoes at all times and checking feet regularly.  -Mechanically debrided all nails 1-5 bilateral using sterile nail nipper and filed with dremel without incident  -Answered all patient questions -Patient to return  in 3 months for at risk foot care -Patient advised to call the office if any problems or questions arise in the meantime.   Louann Sjogren, DPM

## 2021-10-28 NOTE — Progress Notes (Addendum)
Surgical Instructions    Your procedure is scheduled on 10/30/21.  Report to [facility] Main Entrance "A" at 8:45 A.M., then check in with the Admitting office.  Call this number if you have problems the morning of surgery:  [phone]   If you have any questions prior to your surgery date call [phone]: Open Monday-Friday 8am-4pm    Remember:  Do not eat after midnight the night before your surgery  You may drink clear liquids until 7:45am the morning of your surgery.   Clear liquids allowed are: Water, Non-Citrus Juices (without pulp), Carbonated Beverages, Clear Tea, Black Coffee ONLY (NO MILK, CREAM OR POWDERED CREAMER of any kind), and Gatorade  Patient Instructions  The night before surgery:  No food after midnight. ONLY clear liquids after midnight   The day of surgery (if you have diabetes): Drink ONE (1) 12 oz G2 given to you in your pre admission testing appointment by 7:45am the morning of surgery. Drink in one sitting. Do not sip.  This drink was given to you during your hospital  pre-op appointment visit.  Nothing else to drink after completing the  12 oz bottle of G2.         If you have questions, please contact your surgeons office.     Take these medicines the morning of surgery with A SIP OF WATER:  doxycycline (VIBRA-TABS) gabapentin (NEURONTIN) metoprolol tartrate (LOPRESSOR) pantoprazole (PROTONIX)  sertraline (ZOLOFT)   IF NEEDED: acetaminophen (TYLENOL) Oxycodone HCl  As of today, STOP taking any Aspirin (unless otherwise instructed by your surgeon) Aleve, Naproxen, Ibuprofen, Motrin, Advil, Goody's, BC's, all herbal medications, fish oil, and all vitamins.  Please follow your surgeons instructions on when to stop taking Warfarin (coumadin).   WHAT DO I DO ABOUT MY DIABETES MEDICATION?   Do not take oral diabetes medicines (pills) the morning of surgery.      THE MORNING OF SURGERY, do not take metFORMIN (GLUCOPHAGE)   The day of  surgery, do not take other diabetes injectables, including Byetta (exenatide), Bydureon (exenatide ER), Victoza (liraglutide), or Trulicity (dulaglutide).  If your CBG is greater than 220 mg/dL, you may take  of your sliding scale (correction) dose of insulin.   HOW TO MANAGE YOUR DIABETES BEFORE AND AFTER SURGERY  Why is it important to control my blood sugar before and after surgery? Improving blood sugar levels before and after surgery helps healing and can limit problems. A way of improving blood sugar control is eating a healthy diet by:  Eating less sugar and carbohydrates  Increasing activity/exercise  Talking with your doctor about reaching your blood sugar goals High blood sugars (greater than 180 mg/dL) can raise your risk of infections and slow your recovery, so you will need to focus on controlling your diabetes during the weeks before surgery. Make sure that the doctor who takes care of your diabetes knows about your planned surgery including the date and location.  How do I manage my blood sugar before surgery? Check your blood sugar at least 4 times a day, starting 2 days before surgery, to make sure that the level is not too high or low.  Check your blood sugar the morning of your surgery when you wake up and every 2 hours until you get to the Short Stay unit.  If your blood sugar is less than 70 mg/dL, you will need to treat for low blood sugar: Do not take insulin. Treat a low blood sugar (less than 70 mg/dL) with  cup of clear juice (cranberry or apple), 4 glucose tablets, OR glucose gel. Recheck blood sugar in 15 minutes after treatment (to make sure it is greater than 70 mg/dL). If your blood sugar is not greater than 70 mg/dL on recheck, call [phone] for further instructions. Report your blood sugar to the short stay nurse when you get to Short Stay.  If you are admitted to the hospital after surgery: Your blood sugar will be checked by the staff and you will  probably be given insulin after surgery (instead of oral diabetes medicines) to make sure you have good blood sugar levels. The goal for blood sugar control after surgery is 80-180 mg/dL.   After your COVID test   You are not required to quarantine however you are required to wear a well-fitting mask when you are out and around people not in your household.  If your mask becomes wet or soiled, replace with a new one.  Wash your hands often with soap and water for 20 seconds or clean your hands with an alcohol-based hand sanitizer that contains at least 60% alcohol.  Do not share personal items.  Notify your provider: if you are in close contact with someone who has COVID  or if you develop a fever of 100.4 or greater, sneezing, cough, sore throat, shortness of breath or body aches.           Do not wear jewelry or makeup Do not wear lotions, powders, perfumes/colognes, or deodorant. Men may shave face and neck. Do not bring valuables to the hospital. Do not wear nail polish, gel polish, artificial nails, or any other type of covering on natural nails (fingers and toes) If you have artificial nails or gel coating that need to be removed by a nail salon, please have this removed prior to surgery. Artificial nails or gel coating may interfere with anesthesia's ability to adequately monitor your vital signs.             [facility] is not responsible for any belongings or valuables.  Do NOT Smoke (Tobacco/Vaping)  24 hours prior to your procedure  If you use a CPAP at night, you may bring your mask for your overnight stay.   Contacts, glasses, hearing aids, dentures or partials may not be worn into surgery, please bring cases for these belongings   For patients admitted to the hospital, discharge time will be determined by your treatment team.   Patients discharged the day of surgery will not be allowed to drive home, and someone needs to stay with them for 24 hours.  NO VISITORS WILL  BE ALLOWED IN PRE-OP WHERE PATIENTS ARE PREPPED FOR SURGERY.  ONLY 1 SUPPORT PERSON MAY BE PRESENT IN THE WAITING ROOM WHILE YOU ARE IN SURGERY.  IF YOU ARE TO BE ADMITTED, ONCE YOU ARE IN YOUR ROOM YOU WILL BE ALLOWED TWO (2) VISITORS. 1 (ONE) VISITOR MAY STAY OVERNIGHT BUT MUST ARRIVE TO THE ROOM BY 8pm.  Minor children may have two parents present. Special consideration for safety and communication needs will be reviewed on a case by case basis.  Special instructions:    Oral Hygiene is also important to reduce your risk of infection.  Remember - BRUSH YOUR TEETH THE MORNING OF SURGERY WITH YOUR REGULAR TOOTHPASTE   [facility]- Preparing For Surgery  Before surgery, you can play an important role. Because skin is not sterile, your skin needs to be as free of germs as possible. You can reduce  the number of germs on your skin by washing with CHG (chlorahexidine gluconate) Soap before surgery.  CHG is an antiseptic cleaner which kills germs and bonds with the skin to continue killing germs even after washing.     Please do not use if you have an allergy to CHG or antibacterial soaps. If your skin becomes reddened/irritated stop using the CHG.  Do not shave (including legs and underarms) for at least 48 hours prior to first CHG shower. It is OK to shave your face.  Please follow these instructions carefully.     Shower the NIGHT BEFORE SURGERY and the MORNING OF SURGERY with CHG Soap.   If you chose to wash your hair, wash your hair first as usual with your normal shampoo. After you shampoo, rinse your hair and body thoroughly to remove the shampoo.  Then [employer] and genitals (private parts) with your normal soap and rinse thoroughly to remove soap.  After that Use CHG Soap as you would any other liquid soap. You can apply CHG directly to the skin and wash gently with a scrungie or a clean washcloth.   Apply the CHG Soap to your body ONLY FROM THE NECK DOWN.  Do not use on open wounds or  open sores. Avoid contact with your eyes, ears, mouth and genitals (private parts). Wash Face and genitals (private parts)  with your normal soap.   Wash thoroughly, paying special attention to the area where your surgery will be performed.  Thoroughly rinse your body with warm water from the neck down.  DO NOT shower/wash with your normal soap after using and rinsing off the CHG Soap.  Pat yourself dry with a CLEAN TOWEL.  Wear CLEAN PAJAMAS to bed the night before surgery  Place CLEAN SHEETS on your bed the night before your surgery  DO NOT SLEEP WITH PETS.   Day of Surgery: Take a shower with CHG soap. Wear Clean/Comfortable clothing the morning of surgery Do not apply any deodorants/lotions.   Remember to brush your teeth WITH YOUR REGULAR TOOTHPASTE.   Please read over the following fact sheets that you were given.

## 2021-10-29 ENCOUNTER — Encounter (HOSPITAL_COMMUNITY): Payer: Self-pay

## 2021-10-29 ENCOUNTER — Encounter: Payer: Self-pay | Admitting: Orthopedic Surgery

## 2021-10-29 ENCOUNTER — Encounter (HOSPITAL_COMMUNITY)
Admission: RE | Admit: 2021-10-29 | Discharge: 2021-10-29 | Disposition: A | Discharge status: Home / Self Care | Payer: 59 | Source: Ambulatory Visit | Attending: Orthopedic Surgery | Admitting: Orthopedic Surgery

## 2021-10-29 ENCOUNTER — Other Ambulatory Visit: Payer: Self-pay

## 2021-10-29 VITALS — BP 120/77 | HR 93 | Temp 97.9°F | Resp 17 | Ht 70.0 in | Wt 350.0 lb

## 2021-10-29 DIAGNOSIS — E7800 Pure hypercholesterolemia, unspecified: Secondary | ICD-10-CM | POA: Insufficient documentation

## 2021-10-29 DIAGNOSIS — Z89512 Acquired absence of left leg below knee: Secondary | ICD-10-CM | POA: Insufficient documentation

## 2021-10-29 DIAGNOSIS — I272 Pulmonary hypertension, unspecified: Secondary | ICD-10-CM | POA: Insufficient documentation

## 2021-10-29 DIAGNOSIS — I2510 Atherosclerotic heart disease of native coronary artery without angina pectoris: Secondary | ICD-10-CM | POA: Insufficient documentation

## 2021-10-29 DIAGNOSIS — Z20822 Contact with and (suspected) exposure to covid-19: Secondary | ICD-10-CM | POA: Insufficient documentation

## 2021-10-29 DIAGNOSIS — I350 Nonrheumatic aortic (valve) stenosis: Secondary | ICD-10-CM | POA: Insufficient documentation

## 2021-10-29 DIAGNOSIS — Z87891 Personal history of nicotine dependence: Secondary | ICD-10-CM | POA: Insufficient documentation

## 2021-10-29 DIAGNOSIS — Y835 Amputation of limb(s) as the cause of abnormal reaction of the patient, or of later complication, without mention of misadventure at the time of the procedure: Secondary | ICD-10-CM | POA: Insufficient documentation

## 2021-10-29 DIAGNOSIS — Z6841 Body Mass Index (BMI) 40.0 and over, adult: Secondary | ICD-10-CM | POA: Insufficient documentation

## 2021-10-29 DIAGNOSIS — R011 Cardiac murmur, unspecified: Secondary | ICD-10-CM | POA: Insufficient documentation

## 2021-10-29 DIAGNOSIS — I10 Essential (primary) hypertension: Secondary | ICD-10-CM | POA: Insufficient documentation

## 2021-10-29 DIAGNOSIS — Z7901 Long term (current) use of anticoagulants: Secondary | ICD-10-CM | POA: Insufficient documentation

## 2021-10-29 DIAGNOSIS — Z89422 Acquired absence of other left toe(s): Secondary | ICD-10-CM | POA: Insufficient documentation

## 2021-10-29 DIAGNOSIS — Z01818 Encounter for other preprocedural examination: Secondary | ICD-10-CM

## 2021-10-29 DIAGNOSIS — E119 Type 2 diabetes mellitus without complications: Secondary | ICD-10-CM | POA: Insufficient documentation

## 2021-10-29 DIAGNOSIS — T8130XA Disruption of wound, unspecified, initial encounter: Secondary | ICD-10-CM | POA: Insufficient documentation

## 2021-10-29 DIAGNOSIS — Z01812 Encounter for preprocedural laboratory examination: Secondary | ICD-10-CM | POA: Insufficient documentation

## 2021-10-29 DIAGNOSIS — E43 Unspecified severe protein-calorie malnutrition: Secondary | ICD-10-CM

## 2021-10-29 DIAGNOSIS — S88112A Complete traumatic amputation at level between knee and ankle, left lower leg, initial encounter: Secondary | ICD-10-CM

## 2021-10-29 DIAGNOSIS — Z952 Presence of prosthetic heart valve: Secondary | ICD-10-CM | POA: Insufficient documentation

## 2021-10-29 DIAGNOSIS — R0600 Dyspnea, unspecified: Secondary | ICD-10-CM | POA: Insufficient documentation

## 2021-10-29 LAB — COMPREHENSIVE METABOLIC PANEL
ALT: 20 U/L (ref 0–44)
AST: 22 U/L (ref 15–41)
Albumin: 3.6 g/dL (ref 3.5–5.0)
Alkaline Phosphatase: 57 U/L (ref 38–126)
Anion gap: 12 (ref 5–15)
BUN: 11 mg/dL (ref 6–20)
CO2: 22 mmol/L (ref 22–32)
Calcium: 9.3 mg/dL (ref 8.9–10.3)
Chloride: 100 mmol/L (ref 98–111)
Creatinine, Ser: 0.71 mg/dL (ref 0.61–1.24)
GFR, Estimated: >60 mL/min (ref >60)
Glucose, Bld: 187 mg/dL — ABNORMAL HIGH (ref 70–99)
Potassium: 4.2 mmol/L (ref 3.5–5.1)
Sodium: 134 mmol/L — ABNORMAL LOW (ref 135–145)
Total Bilirubin: 0.8 mg/dL (ref 0.3–1.2)
Total Protein: 7.4 g/dL (ref 6.5–8.1)

## 2021-10-29 LAB — CBC WITH DIFFERENTIAL/PLATELET
Abs Immature Granulocytes: 0.05 10*3/uL (ref 0.00–0.07)
Basophils Absolute: 0.1 10*3/uL (ref 0.0–0.1)
Basophils Relative: 1 %
Eosinophils Absolute: 0.2 10*3/uL (ref 0.0–0.5)
Eosinophils Relative: 1 %
HCT: 40.9 % (ref 39.0–52.0)
Hemoglobin: 12.6 g/dL — ABNORMAL LOW (ref 13.0–17.0)
Immature Granulocytes: 1 %
Lymphocytes Relative: 23 %
Lymphs Abs: 2.4 10*3/uL (ref 0.7–4.0)
MCH: 25.8 pg — ABNORMAL LOW (ref 26.0–34.0)
MCHC: 30.8 g/dL (ref 30.0–36.0)
MCV: 83.6 fL (ref 80.0–100.0)
Monocytes Absolute: 0.7 10*3/uL (ref 0.1–1.0)
Monocytes Relative: 6 %
Neutro Abs: 7.5 10*3/uL (ref 1.7–7.7)
Neutrophils Relative %: 68 %
Platelets: 285 10*3/uL (ref 150–400)
RBC: 4.89 MIL/uL (ref 4.22–5.81)
RDW: 15.4 % (ref 11.5–15.5)
WBC: 10.8 10*3/uL — ABNORMAL HIGH (ref 4.0–10.5)
nRBC: 0 % (ref 0.0–0.2)

## 2021-10-29 LAB — PREALBUMIN: Prealbumin: 21.6 mg/dL (ref 18–38)

## 2021-10-29 LAB — MAGNESIUM: Magnesium: 1.5 mg/dL — ABNORMAL LOW (ref 1.7–2.4)

## 2021-10-29 LAB — GLUCOSE, CAPILLARY: Glucose-Capillary: 206 mg/dL — ABNORMAL HIGH (ref 70–99)

## 2021-10-29 LAB — SARS CORONAVIRUS 2 (TAT 6-24 HRS): SARS Coronavirus 2: NEGATIVE

## 2021-10-29 NOTE — Anesthesia Preprocedure Evaluation (Addendum)
Anesthesia Evaluation  Patient identified by MRN, date of birth, ID band Patient awake    Reviewed: Allergy & Precautions, NPO status , Patient's Chart, lab work & pertinent test results  Airway Mallampati: II  TM Distance: >3 FB Neck ROM: Full    Dental no notable dental hx. (+) Edentulous Upper, Edentulous Lower   Pulmonary neg pulmonary ROS,    Pulmonary exam normal breath sounds clear to auscultation       Cardiovascular hypertension, + CAD  Normal cardiovascular exam+ Valvular Problems/Murmurs AS  Rhythm:Regular Rate:Normal  08/2021 echo  1. Left ventricular ejection fraction, by estimation, is 60 to 65%. The  left ventricle has normal function. The left ventricle has no regional  wall motion abnormalities.  2. Right ventricular systolic function is normal. The right ventricular  size is mildly enlarged.  3. No left atrial/left atrial appendage thrombus was detected.  4. The mitral valve is normal in structure. Trivial mitral valve  regurgitation.  5. There is a 25 mm bileaflet valve present in the aortic position   Aortic valve regurgitation is not visualized. Echo findings are  consistent with normal structure and function of the aortic valve  prosthesis. Aortic valve mean gradient measures 13.0 mmHg.    Neuro/Psych negative neurological ROS  negative psych ROS   GI/Hepatic Neg liver ROS,   Endo/Other  diabetesMorbid obesity (BMI 50.22)  Renal/GU      Musculoskeletal   Abdominal (+) + obese,   Peds  Hematology  (+) Blood dyscrasia, anemia ,   Anesthesia Other Findings   Reproductive/Obstetrics                           Anesthesia Physical Anesthesia Plan  ASA: 3  Anesthesia Plan: Regional   Post-op Pain Management: Regional block and Minimal or no pain anticipated   Induction:   PONV Risk Score and Plan: 2 and Treatment may vary due to age or medical condition,  Midazolam and Dexamethasone  Airway Management Planned: Natural Airway, Nasal Cannula and Simple Face Mask  Additional Equipment: None  Intra-op Plan:   Post-operative Plan:   Informed Consent: I have reviewed the patients History and Physical, chart, labs and discussed the procedure including the risks, benefits and alternatives for the proposed anesthesia with the patient or authorized representative who has indicated his/her understanding and acceptance.     Dental advisory given  Plan Discussed with: CRNA  Anesthesia Plan Comments: (PAT note written 10/29/2021 by Shonna Chock, PA-C.  L popliteal  + L adductor canal )      Anesthesia Quick Evaluation

## 2021-10-29 NOTE — Progress Notes (Signed)
Office Visit Note   Patient: Adam Long           Date of Birth: [birth year]           MRN: [id number] Visit Date: 10/27/2021              Requested by: Noreene Larsson, NP No address on file PCP: Noreene Larsson, NP  Chief Complaint  Patient presents with   Left Leg - Routine Post Op    08/28/2021 left BKA       HPI: Patient is a 46 year old gentleman who is 2 months status post left below-knee amputation.  Patient complains of swelling ulceration redness and drainage.  He has been on doxycycline.  Assessment & Plan: Visit Diagnoses:  1. Below-knee amputation of left lower extremity (HCC)   2. Subacute osteomyelitis of left fibula ([place])     Plan: With the ulcer that probes down to the fibula patient will need revision amputation with excision of the infected bone excision of the ulceration and wound closure.  We will plan for revision surgery with overnight observation.  Follow-Up Instructions: Return in about 2 weeks (around 11/10/2021).   Ortho Exam  Patient is alert, oriented, no adenopathy, well-dressed, normal affect, normal respiratory effort. Examination patient has a 2 cm wound over the distal fibula at this probes 4 cm deep down to the fibula there is cellulitis with clear drainage.  Imaging: No results found. No images are attached to the encounter.  Labs: Lab Results  Component Value Date   HGBA1C 7.8 (H) 08/28/2021   HGBA1C 6.8 (H) 06/04/2021   HGBA1C 6.5 (H) 02/02/2021   ESRSEDRATE 60 (H) 09/14/2021   ESRSEDRATE 65 (H) 09/07/2021   ESRSEDRATE 104 (H) 08/29/2021   CRP 1.5 (H) 09/14/2021   CRP 3.4 (H) 09/07/2021   CRP 12.8 (H) 08/29/2021   REPTSTATUS 09/02/2021 FINAL 08/28/2021   GRAMSTAIN  12/08/2015    ABUNDANT WBC PRESENT, PREDOMINANTLY PMN NO SQUAMOUS EPITHELIAL CELLS SEEN ABUNDANT GRAM POSITIVE COCCI IN CLUSTERS Performed at [facility]  08/28/2021    NO GROWTH 5 DAYS Performed at [facility] [address], [place], [place] [address]    [facility] 08/27/2021   LABORGA STREPTOCOCCUS AGALACTIAE 08/27/2021     Lab Results  Component Value Date   ALBUMIN 2.6 (L) 09/14/2021   ALBUMIN 2.3 (L) 09/03/2021   ALBUMIN 2.7 (L) 08/27/2021    Lab Results  Component Value Date   MG 1.8 02/13/2021   MG 2.1 12/06/2019   MG 2.4 12/06/2019   Lab Results  Component Value Date   VD25OH 38 06/13/2020    No results found for: PREALBUMIN CBC EXTENDED Latest Ref Rng & Units 09/14/2021 09/11/2021 09/09/2021  WBC 4.0 - 10.5 K/uL 7.2 8.2 9.7  RBC 4.22 - 5.81 MIL/uL 3.83(L) 3.79(L) 3.82(L)  HGB 13.0 - 17.0 g/dL 10.4(L) 10.2(L) 10.4(L)  HCT 39.0 - 52.0 % 32.5(L) 31.7(L) 32.4(L)  PLT 150 - 400 K/uL 297 317 328  NEUTROABS 1.7 - 7.7 K/uL 4.2 5.1 6.5  LYMPHSABS 0.7 - 4.0 K/uL 2.2 2.2 2.1     There is no height or weight on file to calculate BMI.  Orders:  No orders of the defined types were placed in this encounter.  No orders of the defined types were placed in this encounter.    Procedures: No procedures performed  Clinical Data: No additional findings.  ROS:  All other systems negative,  except as noted in the HPI. Review of Systems  Objective: Vital Signs: There were no vitals taken for this visit.  Specialty Comments:  No specialty comments available.  PMFS History: Patient Active Problem List   Diagnosis Date Noted   Below-knee amputation of left lower extremity ([place]) 09/02/2021   Severe protein-calorie malnutrition ([place])    MSSA bacteremia    Ulcer of left foot ([place])    Subacute osteomyelitis, left ankle and foot ([place])    Diabetic foot infection ([place]) 02/14/2021   Diabetic ulcer of left foot associated with type 1 diabetes mellitus, limited to breakdown of skin ([place])    Leukocytosis 02/13/2021   Hyponatremia 02/13/2021   Normocytic anemia 02/13/2021   Hyperglycemia due to diabetes mellitus ([place]) 02/13/2021   Open wound of left foot 02/12/2021   Left leg  swelling 02/02/2021   History of complete ray amputation of fifth toe of left foot ([place]) 11/04/2020   Immunization due 11/04/2020   Anxiety 09/30/2020   Preventative health care 12/13/2019   S/P aortic valve replacement with mechanical valve 12/05/2019   S/P AVR (aortic valve replacement) 12/05/2019   Syncope 11/10/2019   Personal history of noncompliance with medical treatment, presenting hazards to health 03/16/2018   Uncontrolled diabetes mellitus with hyperglycemia, without long-term current use of insulin ([place]) 10/12/2015   Hyperlipidemia associated with type 2 diabetes mellitus ([place]) 10/12/2015   Morbid obesity ([place]) 10/12/2015   Chest pain 07/02/2013   HTN (hypertension) 07/02/2013   Past Medical History:  Diagnosis Date   Anxiety    Aortic stenosis    Cellulitis and abscess of lower extremity 06/11/2019   Cellulitis of fourth toe of left foot    Cholelithiasis    Coronary artery disease    Nonobstructive CAD (40-50% LAD) 08/2019   Depression    Depression    Phreesia 09/27/2020   Depression    Phreesia 11/01/2020   Diabetes mellitus without complication ([place])    Phreesia 09/27/2020   Elevated troponin level not due myocardial infarction 11/11/2019   Essential hypertension    Gangrene of toe of left foot ([place]) 07/06/2019   Heart murmur    Phreesia 09/27/2020   Hyperlipidemia    Phreesia 09/27/2020   Hypertension    Phreesia 09/27/2020   Mixed hyperlipidemia    Morbid obesity (HCC)    S/P aortic valve replacement with mechanical valve 12/05/2019   25 mm Carbomedics top hat bileaflet mechanical valve via partial upper hemi-sternotomy   Severe aortic stenosis 09/24/2019   Type 2 diabetes mellitus ([place])     Family History  Problem Relation Age of Onset   Cancer Mother        Brain   Heart disease Father    Hyperlipidemia Father    Hypertension Father    Stroke Father    Heart murmur Sister     Past Surgical History:  Procedure Laterality Date   ABDOMINAL  AORTOGRAM W/LOWER EXTREMITY N/A 07/06/2019   Procedure: ABDOMINAL AORTOGRAM W/LOWER EXTREMITY;  Surgeon: Elam Dutch, MD;  Location: [facility];  Service: Cardiovascular;  Laterality: N/A;  Bilateral   AMPUTATION Left 07/09/2019   Procedure: LEFT FOURTH and Fifth TOE AMPUTATION.;  Surgeon: Rosetta Posner, MD;  Location: [facility];  Service: Vascular;  Laterality: Left;   AMPUTATION Left 03/11/2021   Procedure: LEFT FOOT 5TH  AND 4TH RAY AMPUTATION;  Surgeon: Newt Minion, MD;  Location: [facility];  Service: Orthopedics;  Laterality: Left;   AMPUTATION Left 08/28/2021  Procedure: AMPUTATION BELOW KNEE;  Surgeon: Newt Minion, MD;  Location: [facility];  Service: Orthopedics;  Laterality: Left;   AORTIC VALVE REPLACEMENT N/A 12/05/2019   Procedure: PARTIAL STERNOTOMY FOR AORTIC VALVE REPLACEMENT (AVR), USING CARBOMEDICS SUPRA-ANNULAR TOP HAT 25MM;  Surgeon: Rexene Alberts, MD;  Location: [facility];  Service: Open Heart Surgery;  Laterality: N/A;  No neck lines on left   CARDIAC VALVE REPLACEMENT N/A    Phreesia 09/27/2020   IR RADIOLOGY PERIPHERAL GUIDED IV START  10/05/2019   IR US GUIDE VASC ACCESS RIGHT  10/05/2019   MULTIPLE EXTRACTIONS WITH ALVEOLOPLASTY N/A 10/26/2019   Procedure: EXTRACTION OF TOOTH #'S 3, 5-11,19-28,  AND 32 WITH ALVEOLOPLASTY;  Surgeon: Lenn Cal, DDS;  Location: [facility];  Service: Oral Surgery;  Laterality: N/A;   RIGHT HEART CATH AND CORONARY ANGIOGRAPHY N/A 09/24/2019   Procedure: RIGHT HEART CATH AND CORONARY ANGIOGRAPHY;  Surgeon: Sherren Mocha, MD;  Location: [facility];  Service: Cardiovascular;  Laterality: N/A;   TEE WITHOUT CARDIOVERSION N/A 12/05/2019   Procedure: TRANSESOPHAGEAL ECHOCARDIOGRAM (TEE);  Surgeon: Rexene Alberts, MD;  Location: [facility];  Service: Open Heart Surgery;  Laterality: N/A;   TEE WITHOUT CARDIOVERSION N/A 09/01/2021   Procedure: TRANSESOPHAGEAL ECHOCARDIOGRAM (TEE);  Surgeon: Donato Heinz, MD;  Location: [facility];   Service: Cardiovascular;  Laterality: N/A;   Social History   Occupational History    Comment: Neurosurgeon- self-employed  Tobacco Use   Smoking status: Never   Smokeless tobacco: Former    Types: Chew    Quit date: 2021  Vaping Use   Vaping Use: Never used  Substance and Sexual Activity   Alcohol use: Not Currently    Comment: occasionally   Drug use: No   Sexual activity: Yes    Birth control/protection: None

## 2021-10-29 NOTE — Progress Notes (Signed)
Anesthesia Chart Review:  Case: [id number] Date/Time: 10/30/21 1031   Procedure: REVISION LEFT BELOW KNEE AMPUTATION (Left)   Anesthesia type: Choice   Pre-op diagnosis: Dehiscence Left Below Knee Amputation   [facility] / [facility]   Surgeons: Nadara Mustard, MD       DISCUSSION: Patient is a 46 year old male scheduled for the above procedure.   History includes never smoker (quit smokeless tobacco 09/28/19), HTN, DM2, hypercholesterolemia, murmur/bicuspid AV with severe AS (s/p partial upper hemi-sternotomy for mechanical AVR, 25 mm Carbomedics Top Hat bileaflet), exertional dyspnea, cellulitis/osteomyelitis (s/p left 4th & 5th toe amputations 07/09/19; left 4th & 5th tomes ray amputations 03/11/21; left BKA 08/28/21 in setting of diabetic foot wound with MSSA bacteremia). Pre-AVR cath on 09/24/19 showed non-obstructive CAD (40-50% LAD), severe pulmonary hypertension with mean PA pressure 51 mmHg, severe AS. BMI is consistent with morbid obesity.  Reportedly he was instructed to hold 10/29/21 warfarin dose by Dr. Lajoyce Corners. INR was 3.1 on 10/12/21.   Presurgical COVID-19 test is on process. Anesthesia team to evaluate on the day of surgery.    VS: BP 120/77    Pulse 93    Temp 36.6 C (Oral)    Resp 17    Ht 5\' 10"  (1.778 m)    Wt (!) 158.8 kg    SpO2 100%    BMI 50.22 kg/m    PROVIDERS: , NP is listed as PCP. Last visit 10/19/21 with 10/21/21, NP at [facility].  [facility], MD is primary cardiologist. Last office visit 09/09/20.  09/11/20, MD is endocrinologist. Last visit 01/24/20.  01/26/20, DO is ID. Last visit 10/13/21. "Patient completed 4 weeks of IV cefazolin for MSSA/GBS bacteremia in setting of left diabetic foot ulcer s/p definitive source control with BKA.  PICC line was removed a couple weeks ago and reports no new concerns.  TEE was negative for vegetations in the setting of prosthetic aortic valve.  RTC as needed."     LABS: Preoperative labs noted. Pre-albumin and Mg also ordered by surgeon. Mild hypomagnesemia of 1.5. A1c 7.8% on 08/28/21.  (all labs ordered are listed, but only abnormal results are displayed)  Labs Reviewed  GLUCOSE, CAPILLARY - Abnormal; Notable for the following components:      Result Value   Glucose-Capillary 206 (*)    All other components within normal limits  MAGNESIUM - Abnormal; Notable for the following components:   Magnesium 1.5 (*)    All other components within normal limits  CBC WITH DIFFERENTIAL/PLATELET - Abnormal; Notable for the following components:   WBC 10.8 (*)    Hemoglobin 12.6 (*)    MCH 25.8 (*)    All other components within normal limits  COMPREHENSIVE METABOLIC PANEL - Abnormal; Notable for the following components:   Sodium 134 (*)    Glucose, Bld 187 (*)    All other components within normal limits  SARS CORONAVIRUS 2 (TAT 6-24 HRS)  PREALBUMIN     EKG: 03/11/21: NSR   CV: TEE 09/01/21: IMPRESSIONS   1. Left ventricular ejection fraction, by estimation, is 60 to 65%. The  left ventricle has normal function. The left ventricle has no regional  wall motion abnormalities.   2. Right ventricular systolic function is normal. The right ventricular  size is mildly enlarged.   3. No left atrial/left atrial appendage thrombus was detected.   4. The mitral valve is normal in  structure. Trivial mitral valve  regurgitation.   5. There is a 25 mm bileaflet valve present in the aortic position      Aortic valve regurgitation is not visualized. Echo findings are  consistent with normal structure and function of the aortic valve  prosthesis. Aortic valve mean gradient measures 13.0 mmHg.  - Conclusion(s)/Recommendation(s): No vegetation seen. Pulmonic valve poorly  visualized due to shadowing from mechanical aortic valve. No clear  vegetation on mechanical aortic valve. Would monitor serial echocardiogram  while treating bacteremia if clinical    suspicion for endocarditis.    TTE 08/29/21: IMPRESSIONS   1. Left ventricular ejection fraction, by estimation, is 60 to 65%. The  left ventricle has normal function. The left ventricle has no regional  wall motion abnormalities. There is mild left ventricular hypertrophy.  Left ventricular diastolic parameters  were normal.   2. Right ventricular systolic function is normal. The right ventricular  size is normal.   3. Trivial mitral valve regurgitation. Moderate mitral annular  calcification.   4. 25 mm Carbomedics Top Hat bileaflet mechanical prosthesis present Peak  and mean gradients through the valve are 34 and 20 mm Hg respectively.  COmpared to study from 2021, mean gradient is mildly increased. Leaflets  not well seen . The aortic valve  has been repaired/replaced. Aortic valve regurgitation is not visualized.  There is a 25 mm mechanical valve present in the aortic position.   5. The inferior vena cava is normal in size with greater than 50%  respiratory variability, suggesting right atrial pressure of 3 mmHg.    US Carotid 12/03/19: Summary:  - Right Carotid: The extracranial vessels were near-normal with only minimal wall thickening or plaque.  - Left Carotid: The extracranial vessels were near-normal with only minimal wall thickening or plaque.  - Vertebrals:  Bilateral vertebral arteries demonstrate antegrade flow.  - Subclavians: Bilateral subclavian arteries were not visualized.    Cardiac cath 09/24/19: LM: Mildly calcified. Patent without stenosis. Divides into the LAD and left circumflex. LAD: Mild diffuse disease throughout the vessel. The vessel is severely calcified. Mid LAD after the first diagonal has 40 to 50% stenosis.  LCX: Vessel is small with luminal irregularities. Small OM1. RCA: Large vessel. Mild diffuse disease throughout vessel. Divides into the PDA and posterior AV segment which supplies a single PLA branch.  1.  Calcified coronary arteries with  moderate mid LAD stenosis, otherwise mild nonobstructive disease 2.  Known severe aortic stenosis by echo assessment with heavy calcification and restriction of the aortic valve leaflets as assessed by plain fluoroscopy 3.  Severe pulmonary hypertension with mean PA pressure 51 mmHg, at least in part to left heart disease with elevated wedge pressure of 31 mmHg Recommendations: Ongoing multidisciplinary heart team evaluation for treatment of severe symptomatic aortic stenosis.  Medical therapy for nonobstructive CAD.     Past Medical History:  Diagnosis Date   Anxiety    Aortic stenosis    Cellulitis and abscess of lower extremity 06/11/2019   Cellulitis of fourth toe of left foot    Cholelithiasis    Coronary artery disease    Nonobstructive CAD (40-50% LAD) 08/2019   Depression    Depression    Phreesia 09/27/2020   Depression    Phreesia 11/01/2020   Diabetes mellitus without complication (HCC)    Phreesia 09/27/2020   Elevated troponin level not due myocardial infarction 11/11/2019   Essential hypertension    Gangrene of toe of left foot (HCC) 07/06/2019  Heart murmur    Phreesia 09/27/2020   Hyperlipidemia    Phreesia 09/27/2020   Hypertension    Phreesia 09/27/2020   Mixed hyperlipidemia    Morbid obesity (HCC)    S/P aortic valve replacement with mechanical valve 12/05/2019   25 mm Carbomedics top hat bileaflet mechanical valve via partial upper hemi-sternotomy   Severe aortic stenosis 09/24/2019   Type 2 diabetes mellitus (HCC)     Past Surgical History:  Procedure Laterality Date   ABDOMINAL AORTOGRAM W/LOWER EXTREMITY N/A 07/06/2019   Procedure: ABDOMINAL AORTOGRAM W/LOWER EXTREMITY;  Surgeon: Sherren KernsFields, Charles E, MD;  [facility];  Service: Cardiovascular;  Laterality: N/A;  Bilateral   AMPUTATION Left 07/09/2019   Procedure: LEFT FOURTH and Fifth TOE AMPUTATION.;  Surgeon: Larina EarthlyEarly, Todd F, MD;  [facility];  Service: Vascular;  Laterality:  Left;   AMPUTATION Left 03/11/2021   Procedure: LEFT FOOT 5TH  AND 4TH RAY AMPUTATION;  Surgeon: Nadara Mustarduda, Marcus V, MD;  Location: [facility];  Service: Orthopedics;  Laterality: Left;   AMPUTATION Left 08/28/2021   Procedure: AMPUTATION BELOW KNEE;  Surgeon: Nadara Mustarduda, Marcus V, MD;  [facility];  Service: Orthopedics;  Laterality: Left;   AORTIC VALVE REPLACEMENT N/A 12/05/2019   Procedure: PARTIAL STERNOTOMY FOR AORTIC VALVE REPLACEMENT (AVR), USING CARBOMEDICS SUPRA-ANNULAR TOP HAT 25MM;  Surgeon: Purcell Nailswen, Clarence H, MD;  [facility];  Service: Open Heart Surgery;  Laterality: N/A;  No neck lines on left   CARDIAC VALVE REPLACEMENT N/A    Phreesia 09/27/2020   IR RADIOLOGY PERIPHERAL GUIDED IV START  10/05/2019   IR US GUIDE VASC ACCESS RIGHT  10/05/2019   MULTIPLE EXTRACTIONS WITH ALVEOLOPLASTY N/A 10/26/2019   Procedure: EXTRACTION OF TOOTH #'S 3, 5-11,19-28,  AND 32 WITH ALVEOLOPLASTY;  Surgeon: Charlynne PanderKulinski, Ronald F, DDS;  Location: [facility];  Service: Oral Surgery;  Laterality: N/A;   RIGHT HEART CATH AND CORONARY ANGIOGRAPHY N/A 09/24/2019   Procedure: RIGHT HEART CATH AND CORONARY ANGIOGRAPHY;  Surgeon: Tonny Bollmanooper, Michael, MD;  [facility];  Service: Cardiovascular;  Laterality: N/A;   TEE WITHOUT CARDIOVERSION N/A 12/05/2019   Procedure: TRANSESOPHAGEAL ECHOCARDIOGRAM (TEE);  Surgeon: Purcell Nailswen, Clarence H, MD;  [facility];  Service: Open Heart Surgery;  Laterality: N/A;   TEE WITHOUT CARDIOVERSION N/A 09/01/2021   Procedure: TRANSESOPHAGEAL ECHOCARDIOGRAM (TEE);  Surgeon: Little IshikawaSchumann, Christopher L, MD;  [facility];  Service: Cardiovascular;  Laterality: N/A;    MEDICATIONS:  acetaminophen (TYLENOL) 325 MG tablet   Amino Acids-Protein Hydrolys (FEEDING SUPPLEMENT, PRO-STAT SUGAR FREE 64,) LIQD   atorvastatin (LIPITOR) 80 MG tablet   doxycycline (VIBRA-TABS) 100 MG tablet   gabapentin (NEURONTIN) 300 MG capsule   lisinopril (ZESTRIL) 10 MG tablet   metFORMIN (GLUCOPHAGE) 500  MG tablet   metoprolol tartrate (LOPRESSOR) 25 MG tablet   Oxycodone HCl 10 MG TABS   pantoprazole (PROTONIX) 40 MG tablet   sertraline (ZOLOFT) 50 MG tablet   warfarin (COUMADIN) 10 MG tablet   No current facility-administered medications for this encounter.    Shonna ChockAllison Chayil Gantt, PA-C Surgical Short Stay/Anesthesiology [facility] Phone [phone] [facility] Phone [phone] 10/29/2021 3:27 PM

## 2021-10-29 NOTE — Progress Notes (Signed)
PCP - Bjorn Pippin, NP Cardiologist - Nona Dell   PPM/ICD - denies   Chest x-ray - n/a EKG - 03/11/21 Stress Test - denies ECHO - 09/01/21 Cardiac Cath - 09/24/19  Sleep Study - denies   Fasting Blood Sugar - 150-160 Checks Blood Sugar 2-3 times a day  As of today, STOP taking any Aspirin (unless otherwise instructed by your surgeon) Aleve, Naproxen, Ibuprofen, Motrin, Advil, Goody's, BC's, all herbal medications, fish oil, and all vitamins.   Please follow your surgeons instructions on when to stop taking Warfarin (coumadin).   ERAS Protcol -yes PRE-SURGERY Ensure or G2- g2 ordered and given  COVID TEST- 10/29/21 in PAT   Anesthesia review: yes, on coumadin with no hold instructions. CBG 206 at PAT.  Patient denies shortness of breath, fever, cough and chest pain at PAT appointment   All instructions explained to the patient, with a verbal understanding of the material. Patient agrees to go over the instructions while at home for a better understanding. Patient also instructed to self quarantine after being tested for COVID-19. The opportunity to ask questions was provided.

## 2021-10-30 ENCOUNTER — Encounter (HOSPITAL_COMMUNITY)
Admission: RE | Disposition: A | Discharge status: Home / Self Care | Payer: Self-pay | Source: Home / Self Care | Attending: Orthopedic Surgery

## 2021-10-30 ENCOUNTER — Inpatient Hospital Stay (HOSPITAL_COMMUNITY): Payer: 59 | Admitting: Vascular Surgery

## 2021-10-30 ENCOUNTER — Other Ambulatory Visit: Payer: Self-pay

## 2021-10-30 ENCOUNTER — Inpatient Hospital Stay (HOSPITAL_COMMUNITY)
Admission: RE | Admit: 2021-10-30 | Discharge: 2021-10-31 | DRG: 475 | Disposition: A | Discharge status: Home / Self Care | Payer: 59 | Attending: Orthopedic Surgery | Admitting: Orthopedic Surgery

## 2021-10-30 ENCOUNTER — Inpatient Hospital Stay (HOSPITAL_COMMUNITY): Payer: 59 | Admitting: Anesthesiology

## 2021-10-30 ENCOUNTER — Encounter (HOSPITAL_COMMUNITY): Payer: Self-pay | Admitting: Orthopedic Surgery

## 2021-10-30 DIAGNOSIS — Z20822 Contact with and (suspected) exposure to covid-19: Secondary | ICD-10-CM | POA: Diagnosis present

## 2021-10-30 DIAGNOSIS — Z6841 Body Mass Index (BMI) 40.0 and over, adult: Secondary | ICD-10-CM

## 2021-10-30 DIAGNOSIS — E11622 Type 2 diabetes mellitus with other skin ulcer: Secondary | ICD-10-CM | POA: Diagnosis present

## 2021-10-30 DIAGNOSIS — F419 Anxiety disorder, unspecified: Secondary | ICD-10-CM | POA: Diagnosis present

## 2021-10-30 DIAGNOSIS — T8744 Infection of amputation stump, left lower extremity: Principal | ICD-10-CM | POA: Diagnosis present

## 2021-10-30 DIAGNOSIS — Z952 Presence of prosthetic heart valve: Secondary | ICD-10-CM

## 2021-10-30 DIAGNOSIS — Z87891 Personal history of nicotine dependence: Secondary | ICD-10-CM

## 2021-10-30 DIAGNOSIS — L97826 Non-pressure chronic ulcer of other part of left lower leg with bone involvement without evidence of necrosis: Secondary | ICD-10-CM | POA: Diagnosis not present

## 2021-10-30 DIAGNOSIS — Z89512 Acquired absence of left leg below knee: Secondary | ICD-10-CM | POA: Diagnosis not present

## 2021-10-30 DIAGNOSIS — I2510 Atherosclerotic heart disease of native coronary artery without angina pectoris: Secondary | ICD-10-CM | POA: Diagnosis present

## 2021-10-30 DIAGNOSIS — J309 Allergic rhinitis, unspecified: Secondary | ICD-10-CM | POA: Diagnosis present

## 2021-10-30 DIAGNOSIS — M86262 Subacute osteomyelitis, left tibia and fibula: Secondary | ICD-10-CM | POA: Diagnosis not present

## 2021-10-30 DIAGNOSIS — E43 Unspecified severe protein-calorie malnutrition: Secondary | ICD-10-CM

## 2021-10-30 DIAGNOSIS — E1169 Type 2 diabetes mellitus with other specified complication: Secondary | ICD-10-CM | POA: Diagnosis not present

## 2021-10-30 DIAGNOSIS — Z8249 Family history of ischemic heart disease and other diseases of the circulatory system: Secondary | ICD-10-CM

## 2021-10-30 DIAGNOSIS — I10 Essential (primary) hypertension: Secondary | ICD-10-CM | POA: Diagnosis not present

## 2021-10-30 DIAGNOSIS — E782 Mixed hyperlipidemia: Secondary | ICD-10-CM | POA: Diagnosis present

## 2021-10-30 DIAGNOSIS — Y835 Amputation of limb(s) as the cause of abnormal reaction of the patient, or of later complication, without mention of misadventure at the time of the procedure: Secondary | ICD-10-CM | POA: Diagnosis present

## 2021-10-30 DIAGNOSIS — L02416 Cutaneous abscess of left lower limb: Secondary | ICD-10-CM | POA: Diagnosis present

## 2021-10-30 DIAGNOSIS — Z89422 Acquired absence of other left toe(s): Secondary | ICD-10-CM

## 2021-10-30 DIAGNOSIS — T8781 Dehiscence of amputation stump: Secondary | ICD-10-CM | POA: Diagnosis present

## 2021-10-30 DIAGNOSIS — F32A Depression, unspecified: Secondary | ICD-10-CM | POA: Diagnosis present

## 2021-10-30 DIAGNOSIS — I350 Nonrheumatic aortic (valve) stenosis: Secondary | ICD-10-CM | POA: Diagnosis present

## 2021-10-30 DIAGNOSIS — S88112A Complete traumatic amputation at level between knee and ankle, left lower leg, initial encounter: Secondary | ICD-10-CM

## 2021-10-30 HISTORY — PX: APPLICATION OF WOUND VAC: SHX5189

## 2021-10-30 HISTORY — PX: STUMP REVISION: SHX6102

## 2021-10-30 LAB — GLUCOSE, CAPILLARY
Glucose-Capillary: 170 mg/dL — ABNORMAL HIGH (ref 70–99)
Glucose-Capillary: 153 mg/dL — ABNORMAL HIGH (ref 70–99)
Glucose-Capillary: 135 mg/dL — ABNORMAL HIGH (ref 70–99)
Glucose-Capillary: 155 mg/dL — ABNORMAL HIGH (ref 70–99)
Glucose-Capillary: 171 mg/dL — ABNORMAL HIGH (ref 70–99)

## 2021-10-30 LAB — PROTIME-INR
INR: 1.8 — ABNORMAL HIGH (ref 0.8–1.2)
Prothrombin Time: 20.6 seconds — ABNORMAL HIGH (ref 11.4–15.2)

## 2021-10-30 LAB — HEMOGLOBIN A1C
Hgb A1c MFr Bld: 6.4 % — ABNORMAL HIGH (ref 4.8–5.6)
Mean Plasma Glucose: 136.98 mg/dL

## 2021-10-30 SURGERY — REVISION, AMPUTATION SITE
Anesthesia: Regional | Site: Leg Upper | Laterality: Left

## 2021-10-30 MED ORDER — GABAPENTIN 300 MG PO CAPS
600.0000 mg | ORAL_CAPSULE | Freq: Two times a day (BID) | ORAL | Status: DC
  Administered 2021-10-30 – 2021-10-31 (×3): 600 mg via ORAL
  Filled 2021-10-30 (×3): qty 2

## 2021-10-30 MED ORDER — MAGNESIUM CITRATE PO SOLN: 1.0000 | Freq: Once | ORAL | Status: DC | PRN

## 2021-10-30 MED ORDER — MAGNESIUM SULFATE 2 GM/50ML IV SOLN: 2.0000 g | Freq: Every day | INTRAVENOUS | Status: DC | PRN

## 2021-10-30 MED ORDER — METOPROLOL TARTRATE 5 MG/5ML IV SOLN: 2.0000 mg | INTRAVENOUS | Status: DC | PRN

## 2021-10-30 MED ORDER — METFORMIN HCL 500 MG PO TABS
500.0000 mg | ORAL_TABLET | Freq: Two times a day (BID) | ORAL | Status: DC
  Administered 2021-10-30 – 2021-10-31 (×2): 500 mg via ORAL
  Filled 2021-10-30 (×2): qty 1

## 2021-10-30 MED ORDER — PANTOPRAZOLE SODIUM 40 MG PO TBEC: 40.0000 mg | DELAYED_RELEASE_TABLET | Freq: Every day | ORAL | Status: DC

## 2021-10-30 MED ORDER — INSULIN ASPART 100 UNIT/ML IJ SOLN
0.0000 [IU] | Freq: Three times a day (TID) | INTRAMUSCULAR | Status: DC
  Administered 2021-10-30: 3 [IU] via SUBCUTANEOUS
  Administered 2021-10-30 – 2021-10-31 (×2): 2 [IU] via SUBCUTANEOUS

## 2021-10-30 MED ORDER — ONDANSETRON HCL 4 MG/2ML IJ SOLN: 4.0000 mg | Freq: Once | INTRAMUSCULAR | Status: DC | PRN

## 2021-10-30 MED ORDER — OXYCODONE HCL 5 MG PO TABS
10.0000 mg | ORAL_TABLET | ORAL | Status: DC | PRN
  Administered 2021-10-30 – 2021-10-31 (×3): 15 mg via ORAL
  Filled 2021-10-30 (×4): qty 3

## 2021-10-30 MED ORDER — ASCORBIC ACID 500 MG PO TABS
1000.0000 mg | ORAL_TABLET | Freq: Every day | ORAL | Status: DC
  Administered 2021-10-30 – 2021-10-31 (×2): 1000 mg via ORAL
  Filled 2021-10-30 (×2): qty 2

## 2021-10-30 MED ORDER — PROSOURCE PLUS PO LIQD
30.0000 mL | Freq: Three times a day (TID) | ORAL | Status: DC
  Filled 2021-10-30 (×2): qty 30

## 2021-10-30 MED ORDER — WARFARIN - PHARMACIST DOSING INPATIENT: Freq: Every day | Status: DC

## 2021-10-30 MED ORDER — BISACODYL 5 MG PO TBEC: 5.0000 mg | DELAYED_RELEASE_TABLET | Freq: Every day | ORAL | Status: DC | PRN

## 2021-10-30 MED ORDER — ATORVASTATIN CALCIUM 80 MG PO TABS
80.0000 mg | ORAL_TABLET | Freq: Every day | ORAL | Status: DC
Start: 2021-10-30 — End: 2021-10-31
  Administered 2021-10-30 – 2021-10-31 (×2): 80 mg via ORAL
  Filled 2021-10-30 (×2): qty 1

## 2021-10-30 MED ORDER — ROPIVACAINE HCL 5 MG/ML IJ SOLN
INTRAMUSCULAR | Status: DC | PRN
  Administered 2021-10-30: 20 mL via PERINEURAL

## 2021-10-30 MED ORDER — POLYETHYLENE GLYCOL 3350 17 G PO PACK: 17.0000 g | PACK | Freq: Every day | ORAL | Status: DC | PRN

## 2021-10-30 MED ORDER — PHENOL 1.4 % MT LIQD: 1.0000 | OROMUCOSAL | Status: DC | PRN

## 2021-10-30 MED ORDER — HYDRALAZINE HCL 20 MG/ML IJ SOLN: 5.0000 mg | INTRAMUSCULAR | Status: DC | PRN

## 2021-10-30 MED ORDER — FENTANYL CITRATE (PF) 250 MCG/5ML IJ SOLN
INTRAMUSCULAR | Status: AC
  Filled 2021-10-30: qty 5

## 2021-10-30 MED ORDER — FENTANYL CITRATE (PF) 100 MCG/2ML IJ SOLN
INTRAMUSCULAR | Status: AC
  Administered 2021-10-30: 100 ug via INTRAVENOUS
  Filled 2021-10-30: qty 2

## 2021-10-30 MED ORDER — OXYCODONE HCL 5 MG/5ML PO SOLN: 5.0000 mg | Freq: Once | ORAL | Status: AC | PRN

## 2021-10-30 MED ORDER — OXYCODONE HCL 5 MG PO TABS
ORAL_TABLET | ORAL | Status: AC
  Filled 2021-10-30: qty 1

## 2021-10-30 MED ORDER — POTASSIUM CHLORIDE CRYS ER 20 MEQ PO TBCR: 20.0000 meq | EXTENDED_RELEASE_TABLET | Freq: Every day | ORAL | Status: DC | PRN

## 2021-10-30 MED ORDER — HYDROMORPHONE HCL 1 MG/ML IJ SOLN
0.2500 mg | INTRAMUSCULAR | Status: DC | PRN
  Administered 2021-10-30 (×2): 0.5 mg via INTRAVENOUS

## 2021-10-30 MED ORDER — 0.9 % SODIUM CHLORIDE (POUR BTL) OPTIME
TOPICAL | Status: DC | PRN
  Administered 2021-10-30: 1000 mL

## 2021-10-30 MED ORDER — ALUM & MAG HYDROXIDE-SIMETH 200-200-20 MG/5ML PO SUSP: 15.0000 mL | ORAL | Status: DC | PRN

## 2021-10-30 MED ORDER — AMISULPRIDE (ANTIEMETIC) 5 MG/2ML IV SOLN: 10.0000 mg | Freq: Once | INTRAVENOUS | Status: DC | PRN

## 2021-10-30 MED ORDER — JUVEN PO PACK
1.0000 | PACK | Freq: Two times a day (BID) | ORAL | Status: DC
  Administered 2021-10-30 – 2021-10-31 (×2): 1 via ORAL
  Filled 2021-10-30 (×2): qty 1

## 2021-10-30 MED ORDER — SODIUM CHLORIDE 0.9 % IV SOLN: INTRAVENOUS | Status: DC

## 2021-10-30 MED ORDER — PANTOPRAZOLE SODIUM 40 MG PO TBEC
40.0000 mg | DELAYED_RELEASE_TABLET | Freq: Every day | ORAL | Status: DC
  Administered 2021-10-31: 40 mg via ORAL
  Filled 2021-10-30: qty 1

## 2021-10-30 MED ORDER — PROPOFOL 10 MG/ML IV BOLUS
INTRAVENOUS | Status: DC | PRN
  Administered 2021-10-30 (×5): 20 mg via INTRAVENOUS

## 2021-10-30 MED ORDER — HYDROMORPHONE HCL 1 MG/ML IJ SOLN
0.5000 mg | INTRAMUSCULAR | Status: DC | PRN
  Administered 2021-10-30 – 2021-10-31 (×3): 1 mg via INTRAVENOUS
  Filled 2021-10-30 (×4): qty 1

## 2021-10-30 MED ORDER — LISINOPRIL 10 MG PO TABS
10.0000 mg | ORAL_TABLET | Freq: Every day | ORAL | Status: DC
  Administered 2021-10-30 – 2021-10-31 (×2): 10 mg via ORAL
  Filled 2021-10-30 (×2): qty 1

## 2021-10-30 MED ORDER — ACETAMINOPHEN 10 MG/ML IV SOLN: 1000.0000 mg | Freq: Once | INTRAVENOUS | Status: DC | PRN

## 2021-10-30 MED ORDER — ONDANSETRON HCL 4 MG/2ML IJ SOLN
INTRAMUSCULAR | Status: DC | PRN
  Administered 2021-10-30: 4 mg via INTRAVENOUS

## 2021-10-30 MED ORDER — PROSOURCE PLUS PO LIQD: 30.0000 mL | Freq: Three times a day (TID) | ORAL | Status: DC

## 2021-10-30 MED ORDER — CEFAZOLIN SODIUM-DEXTROSE 2-4 GM/100ML-% IV SOLN: 2.0000 g | Freq: Three times a day (TID) | INTRAVENOUS | Status: DC

## 2021-10-30 MED ORDER — OXYCODONE HCL 5 MG PO TABS
5.0000 mg | ORAL_TABLET | Freq: Once | ORAL | Status: AC | PRN
  Administered 2021-10-30: 5 mg via ORAL

## 2021-10-30 MED ORDER — HYDROMORPHONE HCL 1 MG/ML IJ SOLN
INTRAMUSCULAR | Status: AC
  Administered 2021-10-30: 1 mg via INTRAVENOUS
  Filled 2021-10-30: qty 1

## 2021-10-30 MED ORDER — METOPROLOL TARTRATE 25 MG PO TABS
25.0000 mg | ORAL_TABLET | Freq: Two times a day (BID) | ORAL | Status: DC
Start: 2021-10-30 — End: 2021-10-31
  Administered 2021-10-30 – 2021-10-31 (×2): 25 mg via ORAL
  Filled 2021-10-30 (×2): qty 1

## 2021-10-30 MED ORDER — BUPIVACAINE-EPINEPHRINE (PF) 0.5% -1:200000 IJ SOLN
INTRAMUSCULAR | Status: DC | PRN
Start: 2021-10-30 — End: 2021-10-30
  Administered 2021-10-30: 30 mL via PERINEURAL

## 2021-10-30 MED ORDER — TRANEXAMIC ACID-NACL 1000-0.7 MG/100ML-% IV SOLN
INTRAVENOUS | Status: AC
  Filled 2021-10-30: qty 100

## 2021-10-30 MED ORDER — CEFAZOLIN IN SODIUM CHLORIDE 3-0.9 GM/100ML-% IV SOLN
3.0000 g | INTRAVENOUS | Status: AC
  Administered 2021-10-30: 3 g via INTRAVENOUS
  Filled 2021-10-30: qty 100

## 2021-10-30 MED ORDER — CEFAZOLIN SODIUM-DEXTROSE 2-4 GM/100ML-% IV SOLN
2.0000 g | Freq: Three times a day (TID) | INTRAVENOUS | Status: DC
  Administered 2021-10-30 – 2021-10-31 (×2): 2 g via INTRAVENOUS
  Filled 2021-10-30 (×2): qty 100

## 2021-10-30 MED ORDER — TRANEXAMIC ACID-NACL 1000-0.7 MG/100ML-% IV SOLN
1000.0000 mg | INTRAVENOUS | Status: AC
  Administered 2021-10-30: 1000 mg via INTRAVENOUS
  Filled 2021-10-30: qty 100

## 2021-10-30 MED ORDER — HYDROMORPHONE HCL 1 MG/ML IJ SOLN
INTRAMUSCULAR | Status: AC
  Filled 2021-10-30: qty 1

## 2021-10-30 MED ORDER — LABETALOL HCL 5 MG/ML IV SOLN: 10.0000 mg | INTRAVENOUS | Status: DC | PRN

## 2021-10-30 MED ORDER — PRO-STAT SUGAR FREE PO LIQD
30.0000 mL | Freq: Three times a day (TID) | ORAL | Status: DC
  Filled 2021-10-30 (×2): qty 30

## 2021-10-30 MED ORDER — LACTATED RINGERS IV SOLN: INTRAVENOUS | Status: DC

## 2021-10-30 MED ORDER — GUAIFENESIN-DM 100-10 MG/5ML PO SYRP: 15.0000 mL | ORAL_SOLUTION | ORAL | Status: DC | PRN

## 2021-10-30 MED ORDER — MIDAZOLAM HCL 2 MG/2ML IJ SOLN
INTRAMUSCULAR | Status: AC
  Administered 2021-10-30: 2 mg via INTRAVENOUS
  Filled 2021-10-30: qty 2

## 2021-10-30 MED ORDER — ACETAMINOPHEN 325 MG PO TABS: 325.0000 mg | ORAL_TABLET | Freq: Four times a day (QID) | ORAL | Status: DC | PRN

## 2021-10-30 MED ORDER — ORAL CARE MOUTH RINSE: 15.0000 mL | Freq: Once | OROMUCOSAL | Status: DC

## 2021-10-30 MED ORDER — KETAMINE HCL 10 MG/ML IJ SOLN
INTRAMUSCULAR | Status: DC | PRN
Start: 2021-10-30 — End: 2021-10-30
  Administered 2021-10-30 (×2): 10 mg via INTRAVENOUS
  Administered 2021-10-30 (×2): 5 mg via INTRAVENOUS
  Administered 2021-10-30 (×2): 10 mg via INTRAVENOUS

## 2021-10-30 MED ORDER — OXYCODONE HCL 5 MG PO TABS
5.0000 mg | ORAL_TABLET | ORAL | Status: DC | PRN
  Administered 2021-10-30: 5 mg via ORAL

## 2021-10-30 MED ORDER — WARFARIN SODIUM 5 MG PO TABS
10.0000 mg | ORAL_TABLET | Freq: Once | ORAL | Status: AC
  Administered 2021-10-30: 10 mg via ORAL
  Filled 2021-10-30: qty 2

## 2021-10-30 MED ORDER — ONDANSETRON HCL 4 MG/2ML IJ SOLN: 4.0000 mg | Freq: Four times a day (QID) | INTRAMUSCULAR | Status: DC | PRN

## 2021-10-30 MED ORDER — CHLORHEXIDINE GLUCONATE 0.12 % MT SOLN
15.0000 mL | Freq: Once | OROMUCOSAL | Status: DC
  Filled 2021-10-30: qty 15

## 2021-10-30 MED ORDER — ACETAMINOPHEN 10 MG/ML IV SOLN
INTRAVENOUS | Status: AC
  Filled 2021-10-30: qty 100

## 2021-10-30 MED ORDER — FENTANYL CITRATE (PF) 100 MCG/2ML IJ SOLN
INTRAMUSCULAR | Status: DC | PRN
  Administered 2021-10-30 (×2): 50 ug via INTRAVENOUS

## 2021-10-30 MED ORDER — FENTANYL CITRATE (PF) 100 MCG/2ML IJ SOLN: 100.0000 ug | Freq: Once | INTRAMUSCULAR | Status: AC

## 2021-10-30 MED ORDER — KETAMINE HCL 50 MG/5ML IJ SOSY
PREFILLED_SYRINGE | INTRAMUSCULAR | Status: AC
  Filled 2021-10-30: qty 5

## 2021-10-30 MED ORDER — SERTRALINE HCL 50 MG PO TABS
50.0000 mg | ORAL_TABLET | Freq: Every day | ORAL | Status: DC
  Administered 2021-10-31: 50 mg via ORAL
  Filled 2021-10-30: qty 1

## 2021-10-30 MED ORDER — HYDROMORPHONE HCL 1 MG/ML IJ SOLN
INTRAMUSCULAR | Status: DC | PRN
  Administered 2021-10-30: 1 mg via INTRAVENOUS

## 2021-10-30 MED ORDER — DOCUSATE SODIUM 100 MG PO CAPS
100.0000 mg | ORAL_CAPSULE | Freq: Every day | ORAL | Status: DC
  Administered 2021-10-31: 100 mg via ORAL
  Filled 2021-10-30: qty 1

## 2021-10-30 MED ORDER — ACETAMINOPHEN 10 MG/ML IV SOLN
INTRAVENOUS | Status: DC | PRN
Start: 2021-10-30 — End: 2021-10-30
  Administered 2021-10-30: 1000 mg via INTRAVENOUS

## 2021-10-30 MED ORDER — MIDAZOLAM HCL 2 MG/2ML IJ SOLN: 2.0000 mg | Freq: Once | INTRAMUSCULAR | Status: AC

## 2021-10-30 MED ORDER — PROPOFOL 500 MG/50ML IV EMUL
INTRAVENOUS | Status: DC | PRN
  Administered 2021-10-30: 100 ug/kg/min via INTRAVENOUS

## 2021-10-30 MED ORDER — ZINC SULFATE 220 (50 ZN) MG PO CAPS
220.0000 mg | ORAL_CAPSULE | Freq: Every day | ORAL | Status: DC
  Administered 2021-10-30 – 2021-10-31 (×2): 220 mg via ORAL
  Filled 2021-10-30 (×2): qty 1

## 2021-10-30 SURGICAL SUPPLY — 37 items
BAG COUNTER SPONGE SURGICOUNT (BAG) ×2 IMPLANT
BAG SURGICOUNT SPONGE COUNTING (BAG) ×1
BLADE SAW RECIP 87.9 MT (BLADE) IMPLANT
BLADE SURG 21 STRL SS (BLADE) ×3 IMPLANT
CANISTER WOUND CARE 500ML ATS (WOUND CARE) ×3 IMPLANT
CANISTER WOUNDNEG PRESSURE 500 (CANNISTER) ×2 IMPLANT
COVER SURGICAL LIGHT HANDLE (MISCELLANEOUS) ×3 IMPLANT
DRAPE DERMATAC (DRAPES) ×4 IMPLANT
DRAPE EXTREMITY T 121X128X90 (DISPOSABLE) ×3 IMPLANT
DRAPE HALF SHEET 40X57 (DRAPES) ×3 IMPLANT
DRAPE INCISE 23X17 IOBAN STRL (DRAPES) ×2
DRAPE INCISE 23X17 STRL (DRAPES) IMPLANT
DRAPE INCISE IOBAN 23X17 STRL (DRAPES) ×1 IMPLANT
DRAPE INCISE IOBAN 66X45 STRL (DRAPES) ×3 IMPLANT
DRAPE U-SHAPE 47X51 STRL (DRAPES) ×6 IMPLANT
DRESSING PREVENA PLUS CUSTOM (GAUZE/BANDAGES/DRESSINGS) ×1 IMPLANT
DRSG PREVENA PLUS CUSTOM (GAUZE/BANDAGES/DRESSINGS) ×3
DURAPREP 26ML APPLICATOR (WOUND CARE) ×3 IMPLANT
ELECT REM PT RETURN 9FT ADLT (ELECTROSURGICAL) ×3
ELECTRODE REM PT RTRN 9FT ADLT (ELECTROSURGICAL) ×1 IMPLANT
GLOVE SURG ORTHO LTX SZ9 (GLOVE) ×3 IMPLANT
GLOVE SURG UNDER POLY LF SZ9 (GLOVE) ×3 IMPLANT
GOWN STRL REUS W/ TWL XL LVL3 (GOWN DISPOSABLE) ×2 IMPLANT
GOWN STRL REUS W/TWL XL LVL3 (GOWN DISPOSABLE) ×6
KIT BASIN OR (CUSTOM PROCEDURE TRAY) ×3 IMPLANT
KIT TURNOVER KIT B (KITS) ×3 IMPLANT
MANIFOLD NEPTUNE II (INSTRUMENTS) ×3 IMPLANT
NS IRRIG 1000ML POUR BTL (IV SOLUTION) ×3 IMPLANT
PACK GENERAL/GYN (CUSTOM PROCEDURE TRAY) ×3 IMPLANT
PAD ARMBOARD 7.5X6 YLW CONV (MISCELLANEOUS) ×1 IMPLANT
PREVENA RESTOR ARTHOFORM 46X30 (CANNISTER) ×3 IMPLANT
PREVENA RESTOR AXIOFORM 29X28 (GAUZE/BANDAGES/DRESSINGS) ×2 IMPLANT
STAPLER VISISTAT 35W (STAPLE) IMPLANT
SUT ETHILON 2 0 PSLX (SUTURE) ×8 IMPLANT
SUT SILK 2 0 (SUTURE) ×3
SUT SILK 2-0 18XBRD TIE 12 (SUTURE) IMPLANT
TOWEL GREEN STERILE (TOWEL DISPOSABLE) ×3 IMPLANT

## 2021-10-30 NOTE — Progress Notes (Signed)
ANTICOAGULATION CONSULT NOTE - Initial Consult  Pharmacy Consult for coumadin Indication:  Mechanical AVR  Allergies  Allergen Reactions   Pollen Extract Other (See Comments)    Runny nose, watery eyes, sneezing    Patient Measurements: Height: 5\' 10"  (177.8 cm) Weight: (!) 158.8 kg (350 lb) IBW/kg (Calculated) : 73 Heparin Dosing Weight:   Vital Signs: Temp: 98.2 F (36.8 C) (02/03 1215) BP: 116/65 (02/03 1215) Pulse Rate: 86 (02/03 1215)  Labs: Recent Labs    10/29/21 1351 10/30/21 0917  HGB 12.6*  --   HCT 40.9  --   PLT 285  --   LABPROT  --  20.6*  INR  --  1.8*  CREATININE 0.71  --     Estimated Creatinine Clearance: 177 mL/min (by C-G formula based on SCr of 0.71 mg/dL).   Medical History: Past Medical History:  Diagnosis Date   Anxiety    Aortic stenosis    Cellulitis and abscess of lower extremity 06/11/2019   Cellulitis of fourth toe of left foot    Cholelithiasis    Coronary artery disease    Nonobstructive CAD (40-50% LAD) 08/2019   Depression    Depression    Phreesia 09/27/2020   Depression    Phreesia 11/01/2020   Diabetes mellitus without complication (HCC)    Phreesia 09/27/2020   Elevated troponin level not due myocardial infarction 11/11/2019   Essential hypertension    Gangrene of toe of left foot (HCC) 07/06/2019   Heart murmur    Phreesia 09/27/2020   Hyperlipidemia    Phreesia 09/27/2020   Hypertension    Phreesia 09/27/2020   Mixed hyperlipidemia    Morbid obesity (HCC)    S/P aortic valve replacement with mechanical valve 12/05/2019   25 mm Carbomedics top hat bileaflet mechanical valve via partial upper hemi-sternotomy   Severe aortic stenosis 09/24/2019   Type 2 diabetes mellitus (HCC)     Medications:  Medications Prior to Admission  Medication Sig Dispense Refill Last Dose   acetaminophen (TYLENOL) 325 MG tablet Take 1-2 tablets (325-650 mg total) by mouth every 4 (four) hours as needed for mild pain.   Past Week    atorvastatin (LIPITOR) 80 MG tablet Take 1 tablet (80 mg total) by mouth daily. (Patient taking differently: Take 80 mg by mouth every evening.) 90 tablet 3 10/29/2021   doxycycline (VIBRA-TABS) 100 MG tablet Take 1 tablet (100 mg total) by mouth 2 (two) times daily. 60 tablet 0 10/30/2021 at 0630   gabapentin (NEURONTIN) 300 MG capsule Take 2 capsules (600 mg total) by mouth 2 (two) times daily. 120 capsule 1 10/30/2021 at 0630   lisinopril (ZESTRIL) 10 MG tablet Take 1 tablet by mouth once daily 30 tablet 0 10/29/2021   metFORMIN (GLUCOPHAGE) 500 MG tablet Take 1 tablet (500 mg total) by mouth 2 (two) times daily with a meal. 60 tablet 3 10/29/2021   metoprolol tartrate (LOPRESSOR) 25 MG tablet Take 1 tablet (25 mg total) by mouth 2 (two) times daily. 60 tablet 0 10/30/2021 at 0630   Oxycodone HCl 10 MG TABS Take 1 tablet (10 mg total) by mouth every 8 (eight) hours as needed. 90 tablet 0 Past Week   pantoprazole (PROTONIX) 40 MG tablet Take 1 tablet by mouth once daily 30 tablet 0 10/30/2021 at 0630   sertraline (ZOLOFT) 50 MG tablet Take 1 tablet by mouth once daily 30 tablet 0 10/30/2021 at 0630   warfarin (COUMADIN) 10 MG tablet Take 2 tablets (20  mg total) by mouth daily at 4 PM. (Patient taking differently: Take 10 mg by mouth daily at 4 PM.) 60 tablet 0 10/28/2021 at 2000   Amino Acids-Protein Hydrolys (FEEDING SUPPLEMENT, PRO-STAT SUGAR FREE 64,) LIQD Take 30 mLs by mouth 3 (three) times daily with meals. 3000 mL 0    Scheduled:   vitamin C  1,000 mg Oral Daily   atorvastatin  80 mg Oral Daily   [START ON 10/31/2021] docusate sodium  100 mg Oral Daily   feeding supplement (PRO-STAT SUGAR FREE 64)  30 mL Oral TID WC   gabapentin  600 mg Oral BID   HYDROmorphone       insulin aspart  0-15 Units Subcutaneous TID WC   lisinopril  10 mg Oral Daily   metFORMIN  500 mg Oral BID WC   metoprolol tartrate  25 mg Oral BID   nutrition supplement (JUVEN)  1 packet Oral BID BM   oxyCODONE       pantoprazole  40  mg Oral Daily   pantoprazole  40 mg Oral Daily   sertraline  50 mg Oral Daily   zinc sulfate  220 mg Oral Daily   Infusions:   sodium chloride      ceFAZolin (ANCEF) IV     magnesium sulfate bolus IVPB      Assessment: Pt with a hx of mechanical AVR who was admitted for dehiscence left BKA. He is s/p revision. Coumadin has been ordered to be resumed.   PTA coumadin - 10mg  qday last dose 2/1 INR 1.8 on 2/3  Goal of Therapy:  INR 2-3 Monitor platelets by anticoagulation protocol: Yes   Plan:  Coumadin 10mg  PO x1 Daily INR  , PharmD, [place], AAHIVP, CPP Infectious Disease Pharmacist 10/30/2021 12:51 PM

## 2021-10-30 NOTE — Anesthesia Procedure Notes (Signed)
Anesthesia Regional Block: Popliteal block   Pre-Anesthetic Checklist: , timeout performed,  Correct Patient, Correct Site, Correct Laterality,  Correct Procedure, Correct Position, site marked,  Risks and benefits discussed,  Pre-op evaluation,  At surgeon's request and post-op pain management  Laterality: Lower and Left  Prep: Maximum Sterile Barrier Precautions used, chloraprep       Needles:  Injection technique: Single-shot  Needle Type: Echogenic Needle     Needle Length: 9cm  Needle Gauge: 21     Additional Needles:   Procedures:,,,, ultrasound used (permanent image in chart),,    Narrative:  Start time: 10/30/2021 9:22 AM End time: 10/30/2021 9:30 AM Injection made incrementally with aspirations every 5 mL.  Performed by: Personally  Anesthesiologist: Trevor Iha, MD  Additional Notes: Block assessed. Patient tolerated procedure well.

## 2021-10-30 NOTE — H&P (Signed)
Adam Long is an 46 y.o. male.   Chief Complaint: Abscess ulceration left below-knee amputation. HPI: Patient is a 46 year old gentleman who is 2 months status post left below-knee amputation.  Patient complains of swelling ulceration redness and drainage.  He has been on doxycycline.  Past Medical History:  Diagnosis Date   Anxiety    Aortic stenosis    Cellulitis and abscess of lower extremity 06/11/2019   Cellulitis of fourth toe of left foot    Cholelithiasis    Coronary artery disease    Nonobstructive CAD (40-50% LAD) 08/2019   Depression    Depression    Phreesia 09/27/2020   Depression    Phreesia 11/01/2020   Diabetes mellitus without complication (HCC)    Phreesia 09/27/2020   Elevated troponin level not due myocardial infarction 11/11/2019   Essential hypertension    Gangrene of toe of left foot (HCC) 07/06/2019   Heart murmur    Phreesia 09/27/2020   Hyperlipidemia    Phreesia 09/27/2020   Hypertension    Phreesia 09/27/2020   Mixed hyperlipidemia    Morbid obesity (HCC)    S/P aortic valve replacement with mechanical valve 12/05/2019   25 mm Carbomedics top hat bileaflet mechanical valve via partial upper hemi-sternotomy   Severe aortic stenosis 09/24/2019   Type 2 diabetes mellitus (HCC)     Past Surgical History:  Procedure Laterality Date   ABDOMINAL AORTOGRAM W/LOWER EXTREMITY N/A 07/06/2019   Procedure: ABDOMINAL AORTOGRAM W/LOWER EXTREMITY;  Surgeon: Sherren Kerns, MD;  Location: [facility] INVASIVE CV LAB;  Service: Cardiovascular;  Laterality: N/A;  Bilateral   AMPUTATION Left 07/09/2019   Procedure: LEFT FOURTH and Fifth TOE AMPUTATION.;  Surgeon: Larina Earthly, MD;  Location: [facility] OR;  Service: Vascular;  Laterality: Left;   AMPUTATION Left 03/11/2021   Procedure: LEFT FOOT 5TH  AND 4TH RAY AMPUTATION;  Surgeon: Nadara Mustard, MD;  Location: [facility] OR;  Service: Orthopedics;  Laterality: Left;   AMPUTATION Left 08/28/2021   Procedure: AMPUTATION BELOW  KNEE;  Surgeon: Nadara Mustard, MD;  Location: [facility] OR;  Service: Orthopedics;  Laterality: Left;   AORTIC VALVE REPLACEMENT N/A 12/05/2019   Procedure: PARTIAL STERNOTOMY FOR AORTIC VALVE REPLACEMENT (AVR), USING CARBOMEDICS SUPRA-ANNULAR TOP HAT ;  Surgeon: Purcell Nails, MD;  Location: [facility] OR;  Service: Open Heart Surgery;  Laterality: N/A;  No neck lines on left   CARDIAC VALVE REPLACEMENT N/A    Phreesia 09/27/2020   IR RADIOLOGY PERIPHERAL GUIDED IV START  10/05/2019   IR US GUIDE VASC ACCESS RIGHT  10/05/2019   MULTIPLE EXTRACTIONS WITH ALVEOLOPLASTY N/A 10/26/2019   Procedure: EXTRACTION OF TOOTH #'S 3, 5-11,19-28,  AND 32 WITH ALVEOLOPLASTY;  Surgeon: Charlynne Pander, DDS;  Location: [facility] OR;  Service: Oral Surgery;  Laterality: N/A;   RIGHT HEART CATH AND CORONARY ANGIOGRAPHY N/A 09/24/2019   Procedure: RIGHT HEART CATH AND CORONARY ANGIOGRAPHY;  Surgeon: Tonny Bollman, MD;  Location: [facility] INVASIVE CV LAB;  Service: Cardiovascular;  Laterality: N/A;   TEE WITHOUT CARDIOVERSION N/A 12/05/2019   Procedure: TRANSESOPHAGEAL ECHOCARDIOGRAM (TEE);  Surgeon: Purcell Nails, MD;  Location: [facility] OR;  Service: Open Heart Surgery;  Laterality: N/A;   TEE WITHOUT CARDIOVERSION N/A 09/01/2021   Procedure: TRANSESOPHAGEAL ECHOCARDIOGRAM (TEE);  Surgeon: Little Ishikawa, MD;  Location: [facility] ENDOSCOPY;  Service: Cardiovascular;  Laterality: N/A;    Family History  Problem Relation Age of Onset   Cancer Mother  Brain   Heart disease Father    Hyperlipidemia Father    Hypertension Father    Stroke Father    Heart murmur Sister    Social History:  reports that he has never smoked. He quit smokeless tobacco use about 2 years ago.  His smokeless tobacco use included chew. He reports current alcohol use. He reports that he does not use drugs.  Allergies:  Allergies  Allergen Reactions   Pollen Extract Other (See Comments)    Runny nose, watery eyes, sneezing    No medications prior  to admission.    Results for orders placed or performed during the hospital encounter of 10/29/21 (from the past 48 hour(s))  SARS CORONAVIRUS 2 (TAT 6-24 HRS) Nasopharyngeal Nasopharyngeal Swab     Status: None   Collection Time: 10/29/21  1:19 PM   Specimen: Nasopharyngeal Swab  Result Value Ref Range   SARS Coronavirus 2 NEGATIVE NEGATIVE    Comment: (NOTE) SARS-CoV-2 target nucleic acids are NOT DETECTED.  The SARS-CoV-2 RNA is generally detectable in upper and lower respiratory specimens during the acute phase of infection. Negative results do not preclude SARS-CoV-2 infection, do not rule out co-infections with other pathogens, and should not be used as the sole basis for treatment or other patient management decisions. Negative results must be combined with clinical observations, patient history, and epidemiological information. The expected result is Negative.  Fact Sheet for Patients: HairSlick.nohttps://www.fda.gov/media/138098/download  Fact Sheet for Healthcare Providers: quierodirigir.comhttps://www.fda.gov/media/138095/download  This test is not yet approved or cleared by the [place] FDA and  has been authorized for detection and/or diagnosis of SARS-CoV-2 by FDA under an Emergency Use Authorization (EUA). This EUA will remain  in effect (meaning this test can be used) for the duration of the COVID-19 declaration under Se ction 564(b)(1) of the Act, 21 U.S.C. section 360bbb-3(b)(1), unless the authorization is terminated or revoked sooner.  Performed at [facility], [address] [place], [place] [address]   Glucose, capillary     Status: Abnormal   Collection Time: 10/29/21  1:24 PM  Result Value Ref Range   Glucose-Capillary 206 (H) 70 - 99 mg/dL    Comment: Glucose reference range applies only to samples taken after fasting for at least 8 hours.  Prealbumin     Status: None   Collection Time: 10/29/21  1:51 PM  Result Value Ref Range   Prealbumin 21.6 18 - 38 mg/dL     Comment: Performed at [facility], [address] [place], [place] [address]  Magnesium     Status: Abnormal   Collection Time: 10/29/21  1:51 PM  Result Value Ref Range   Magnesium 1.5 (L) 1.7 - 2.4 mg/dL    Comment: Performed at [facility], [address] [place], [place] [address]  CBC WITH DIFFERENTIAL     Status: Abnormal   Collection Time: 10/29/21  1:51 PM  Result Value Ref Range   WBC 10.8 (H) 4.0 - 10.5 K/uL   RBC 4.89 4.22 - 5.81 MIL/uL   Hemoglobin 12.6 (L) 13.0 - 17.0 g/dL   HCT 41.640.9 60.639.0 - 30.152.0 %   MCV 83.6 80.0 - 100.0 fL   MCH 25.8 (L) 26.0 - 34.0 pg   MCHC 30.8 30.0 - 36.0 g/dL   RDW 60.115.4 09.311.5 - 23.515.5 %   Platelets 285 150 - 400 K/uL   nRBC 0.0 0.0 - 0.2 %   Neutrophils Relative % 68 %   Neutro  Abs 7.5 1.7 - 7.7 K/uL   Lymphocytes Relative 23 %   Lymphs Abs 2.4 0.7 - 4.0 K/uL   Monocytes Relative 6 %   Monocytes Absolute 0.7 0.1 - 1.0 K/uL   Eosinophils Relative 1 %   Eosinophils Absolute 0.2 0.0 - 0.5 K/uL   Basophils Relative 1 %   Basophils Absolute 0.1 0.0 - 0.1 K/uL   Immature Granulocytes 1 %   Abs Immature Granulocytes 0.05 0.00 - 0.07 K/uL    Comment: Performed at [facility], [address], [place], [place] [address]  Comprehensive metabolic panel     Status: Abnormal   Collection Time: 10/29/21  1:51 PM  Result Value Ref Range   Sodium 134 (L) 135 - 145 mmol/L   Potassium 4.2 3.5 - 5.1 mmol/L   Chloride 100 98 - 111 mmol/L   CO2 22 22 - 32 mmol/L   Glucose, Bld 187 (H) 70 - 99 mg/dL    Comment: Glucose reference range applies only to samples taken after fasting for at least 8 hours.   BUN 11 6 - 20 mg/dL   Creatinine, Ser 7.34 0.61 - 1.24 mg/dL   Calcium 9.3 8.9 - 19.3 mg/dL   Total Protein 7.4 6.5 - 8.1 g/dL   Albumin 3.6 3.5 - 5.0 g/dL   AST 22 15 - 41 U/L   ALT 20 0 - 44 U/L   Alkaline Phosphatase 57 38 - 126 U/L   Total Bilirubin 0.8 0.3 - 1.2 mg/dL   GFR, Estimated >79 >02 mL/min    Comment: (NOTE) Calculated  using the CKD-EPI Creatinine Equation (2021)    Anion gap 12 5 - 15    Comment: Performed at [facility], [address], [place], [place] [address]   No results found.  Review of Systems  All other systems reviewed and are negative.  There were no vitals taken for this visit. Physical Exam  Patient is alert, oriented, no adenopathy, well-dressed, normal affect, normal respiratory effort. Examination patient has a 2 cm wound over the distal fibula at this probes 4 cm deep down to the fibula there is cellulitis with clear drainage. Assessment/Plan 1. Below-knee amputation of left lower extremity (HCC)   2. Subacute osteomyelitis of left fibula (HCC)       Plan: With the ulcer that probes down to the fibula patient will need revision amputation with excision of the infected bone excision of the ulceration and wound closure.  We will plan for revision surgery with overnight observation.  Nadara Mustard, MD 10/30/2021, 6:57 AM

## 2021-10-30 NOTE — Op Note (Signed)
10/30/2021  11:48 AM  PATIENT:  Adam Long    PRE-OPERATIVE DIAGNOSIS:  Dehiscence Left Below Knee Amputation  POST-OPERATIVE DIAGNOSIS:  Same  PROCEDURE:  REVISION LEFT BELOW KNEE AMPUTATION, APPLICATION OF WOUND VAC  SURGEON:  Nadara Mustard, MD  PHYSICIAN ASSISTANT:None ANESTHESIA:   General  PREOPERATIVE INDICATIONS:  [name] is a  46 y.o. male with a diagnosis of Dehiscence Left Below Knee Amputation who failed conservative measures and elected for surgical management.    The risks benefits and alternatives were discussed with the patient preoperatively including but not limited to the risks of infection, bleeding, nerve injury, cardiopulmonary complications, the need for revision surgery, among others, and the patient was willing to proceed.  OPERATIVE IMPLANTS: Praveena customizable and Arthur form wound VAC.  @ENCIMAGES @  OPERATIVE FINDINGS: Large deep ulcer tissue sent for cultures.  No purulent abscess no necrotic tissue.  OPERATIVE PROCEDURE: Patient is brought the operating room after undergoing a regional anesthetic.  After adequate levels anesthesia were obtained patient's left lower extremity was prepped using DuraPrep draped into a sterile field a timeout was called.  A fishmouth incision was made around the ulcerative tissue this extended down to bone.  The sinus draining tract extended down to bone and the distal aspect of the bone was resected with a reciprocating saw.  The tissue margins had good bleeding viable healthy tissue no evidence of tissue margins involved with the infection.  The wound was irrigated electrocardio was used for hemostasis.  The incision was closed using 2-0 nylon.  The form and customizable Prevena wound VAC was applied this had a good suction fit this was covered with the stump shrinker and the limb protector.  Patient was taken the PACU in stable condition.   DISCHARGE PLANNING:  Antibiotic duration: Continue  antibiotics  Weightbearing: Weightbearing as tolerated on the right nonweightbearing on the left  Pain medication: Opioid pathway  Dressing care/ Wound VAC: Continue wound VAC for 1 week  Ambulatory devices: Walker  Discharge to: Anticipate discharge to home  Follow-up: In the office 1 week post operative.

## 2021-10-30 NOTE — Interval H&P Note (Signed)
History and Physical Interval Note:  10/30/2021 10:18 AM  Adam Long  has presented today for surgery, with the diagnosis of Dehiscence Left Below Knee Amputation.  The various methods of treatment have been discussed with the patient and family. After consideration of risks, benefits and other options for treatment, the patient has consented to  Procedure(s): REVISION LEFT BELOW KNEE AMPUTATION (Left) as a surgical intervention.  The patient's history has been reviewed, patient examined, no change in status, stable for surgery.  I have reviewed the patient's chart and labs.  Questions were answered to the patient's satisfaction.     Nadara Mustard

## 2021-10-30 NOTE — Anesthesia Postprocedure Evaluation (Signed)
Anesthesia Post Note  Patient: Adam Long  Procedure(s) Performed: REVISION LEFT BELOW KNEE AMPUTATION (Left: Leg Upper) APPLICATION OF WOUND VAC (Left: Leg Upper)     Patient location during evaluation: PACU Anesthesia Type: Regional Level of consciousness: awake and alert Pain management: pain level controlled Vital Signs Assessment: post-procedure vital signs reviewed and stable Respiratory status: spontaneous breathing, nonlabored ventilation, respiratory function stable and patient connected to nasal cannula oxygen Cardiovascular status: stable and blood pressure returned to baseline Postop Assessment: no apparent nausea or vomiting Anesthetic complications: no   No notable events documented.  Last Vitals:  Vitals:   10/30/21 1215 10/30/21 1248  BP: 116/65 137/68  Pulse: 86 85  Resp: 14   Temp: 36.8 C 36.4 C  SpO2: 95% 98%    Last Pain:  Vitals:   10/30/21 1417  TempSrc:   PainSc: 8                  Trevor Iha

## 2021-10-30 NOTE — Transfer of Care (Signed)
Immediate Anesthesia Transfer of Care Note  Patient: Adam Long  Procedure(s) Performed: REVISION LEFT BELOW KNEE AMPUTATION (Left: Leg Upper)  Patient Location: PACU  Anesthesia Type:MAC combined with regional for post-op pain  Level of Consciousness: awake, alert  and oriented  Airway & Oxygen Therapy: Patient Spontanous Breathing  Post-op Assessment: Report given to RN and Post -op Vital signs reviewed and stable  Post vital signs: Reviewed and stable  Last Vitals:  Vitals Value Taken Time  BP 106/60 10/30/21 1127  Temp    Pulse    Resp 22 10/30/21 1129  SpO2 95   Vitals shown include unvalidated device data.  Last Pain: There were no vitals filed for this visit.       Complications: No notable events documented.

## 2021-10-30 NOTE — Evaluation (Signed)
Physical Therapy Evaluation Patient Details Name: Adam Long MRN: [id number] DOB: May 10, 1976 Today's Date: 10/30/2021  History of Present Illness  Patient is a 46 year old gentleman who is 2 months status post left below-knee amputation.  Admitted with swelling ulceration redness and drainage now s/p BKA revision on 10/30/2021.  PMH positive for DM, mechanical AVR on warfarin, CAD, HTN, HLD, morbid obesity.  Clinical Impression  Patient presents with mobility not far from baseline.  He did not tolerate a lot today post surgery due to pain.  He has assistance as needed, but was independent with mobility and ADL's prior to admission.  Feel he may benefit from skilled PT in the acute setting if not d/c soon (as he anticipates), but likely not to need follow up PT at d/c.        Recommendations for follow up therapy are one component of a multi-disciplinary discharge planning process, led by the attending physician.  Recommendations may be updated based on patient status, additional functional criteria and insurance authorization.  Follow Up Recommendations No PT follow up    Assistance Recommended at Discharge Intermittent Supervision/Assistance  Patient can return home with the following  Assist for transportation;Help with stairs or ramp for entrance    Equipment Recommendations None recommended by PT  Recommendations for Other Services       Functional Status Assessment Patient has had a recent decline in their functional status and demonstrates the ability to make significant improvements in function in a reasonable and predictable amount of time.     Precautions / Restrictions Precautions Precautions: Fall Precaution Comments: wound vac on L, limb protector on L Restrictions Weight Bearing Restrictions: Yes LLE Weight Bearing: Non weight bearing      Mobility  Bed Mobility Overal bed mobility: Needs Assistance Bed Mobility: Supine to Sit, Sit to Supine     Supine to sit:  Supervision, HOB elevated Sit to supine: Supervision   General bed mobility comments: assist to manage wound vac    Transfers Overall transfer level: Needs assistance Equipment used: Rolling walker (2 wheels) Transfers: Sit to/from Stand Sit to Stand: From elevated surface, Min guard           General transfer comment: assist for balance/safety    Ambulation/Gait Ambulation/Gait assistance: Min guard Gait Distance (Feet): 20 Feet Assistive device: Rolling walker (2 wheels) Gait Pattern/deviations: Step-to pattern       General Gait Details: in room ambulation due to pain level and bloody drainage noted into wound vac with dependency.  Stairs            Wheelchair Mobility    Modified Rankin (Stroke Patients Only)       Balance Overall balance assessment: Needs assistance   Sitting balance-Leahy Scale: Good     Standing balance support: Bilateral upper extremity supported, Reliant on assistive device for balance Standing balance-Leahy Scale: Poor Standing balance comment: UE support due to BKA                             Pertinent Vitals/Pain Pain Assessment Pain Assessment: 0-10 Pain Score: 9  Pain Location: L residual limb Pain Intervention(s): Monitored during session, Repositioned    Home Living Family/patient expects to be discharged to:: Private residence Living Arrangements: Non-relatives/Friends Available Help at Discharge: Friend(s);Family;Available PRN/intermittently Type of Home: House Home Access: Stairs to enter   [employer] of Steps: 2 low steps at back door   Home Layout: Two level;Able  to live on main level with bedroom/bathroom Home Equipment: Rolling Walker (2 wheels);Wheelchair - manual;BSC/3in1 Additional Comments: has been sponge bathing    Prior Function               Mobility Comments: has been cooking, mopping, sweeping and washing clothes since first surgery       Hand Dominance    Dominant Hand: Right    Extremity/Trunk Assessment   Upper Extremity Assessment Upper Extremity Assessment: Overall WFL for tasks assessed    Lower Extremity Assessment Lower Extremity Assessment: LLE deficits/detail LLE Deficits / Details: AROM WFL, strength NT due to pain, but antigravity strength noted       Communication   Communication: No difficulties  Cognition Arousal/Alertness: Awake/alert Behavior During Therapy: WFL for tasks assessed/performed Overall Cognitive Status: Within Functional Limits for tasks assessed                                          General Comments General comments (skin integrity, edema, etc.): Educated on use of limb protector due to poor fit on his residual limb    Exercises     Assessment/Plan    PT Assessment Patient needs continued PT services  PT Problem List Decreased mobility;Decreased activity tolerance;Decreased strength       PT Treatment Interventions Therapeutic activities;Functional mobility training;Balance training;Stair training;Therapeutic exercise;Gait training;Patient/family education;DME instruction    PT Goals (Current goals can be found in the Care Plan section)  Acute Rehab PT Goals Patient Stated Goal: to go home tomorrow PT Goal Formulation: With patient Time For Goal Achievement: 11/06/21 Potential to Achieve Goals: Good    Frequency Min 3X/week     Co-evaluation               AM-PAC PT "6 Clicks" Mobility  Outcome Measure Help needed turning from your back to your side while in a flat bed without using bedrails?: A Little Help needed moving from lying on your back to sitting on the side of a flat bed without using bedrails?: A Little Help needed moving to and from a bed to a chair (including a wheelchair)?: A Little Help needed standing up from a chair using your arms (e.g., wheelchair or bedside chair)?: A Little Help needed to walk in hospital room?: Total Help needed  climbing 3-5 steps with a railing? : Total 6 Click Score: 14    End of Session Equipment Utilized During Treatment: Gait belt Activity Tolerance: Patient limited by pain Patient left: in bed;with call bell/phone within reach   PT Visit Diagnosis: Other abnormalities of gait and mobility (R26.89)    Time: 7124-5809 PT Time Calculation (min) (ACUTE ONLY): 26 min   Charges:   PT Evaluation $PT Eval Moderate Complexity: 1 Mod PT Treatments $Therapeutic Activity: 8-22 mins        Sheran Lawless, PT Acute Rehabilitation Services Pager:[phone] Office:[phone] 10/30/2021   Adam Long 10/30/2021, 5:15 PM

## 2021-10-30 NOTE — Anesthesia Procedure Notes (Signed)
Anesthesia Regional Block: Adductor canal block   Pre-Anesthetic Checklist: , timeout performed,  Correct Patient, Correct Site, Correct Laterality,  Correct Procedure, Correct Position, site marked,  Risks and benefits discussed,  Surgical consent,  Pre-op evaluation,  At surgeon's request and post-op pain management  Laterality: Lower and Left  Prep: chloraprep       Needles:  Injection technique: Single-shot  Needle Type: Echogenic Needle     Needle Length: 9cm  Needle Gauge: 22     Additional Needles:   Procedures:,,,, ultrasound used (permanent image in chart),,    Narrative:  Start time: 10/30/2021 9:30 AM End time: 10/30/2021 9:39 AM Injection made incrementally with aspirations every 5 mL.  Performed by: Personally  Anesthesiologist: Barnet Glasgow, MD  Additional Notes: Block assessed prior to surgery. Pt tolerated procedure well.

## 2021-10-31 ENCOUNTER — Encounter (HOSPITAL_COMMUNITY): Payer: Self-pay | Admitting: Orthopedic Surgery

## 2021-10-31 LAB — CBC
HCT: 31.3 % — ABNORMAL LOW (ref 39.0–52.0)
Hemoglobin: 10.2 g/dL — ABNORMAL LOW (ref 13.0–17.0)
MCH: 26.7 pg (ref 26.0–34.0)
MCHC: 32.6 g/dL (ref 30.0–36.0)
MCV: 81.9 fL (ref 80.0–100.0)
Platelets: 223 10*3/uL (ref 150–400)
RBC: 3.82 MIL/uL — ABNORMAL LOW (ref 4.22–5.81)
RDW: 15.2 % (ref 11.5–15.5)
WBC: 9.7 10*3/uL (ref 4.0–10.5)
nRBC: 0 % (ref 0.0–0.2)

## 2021-10-31 LAB — BASIC METABOLIC PANEL
Anion gap: 8 (ref 5–15)
BUN: 12 mg/dL (ref 6–20)
CO2: 28 mmol/L (ref 22–32)
Calcium: 8.5 mg/dL — ABNORMAL LOW (ref 8.9–10.3)
Chloride: 96 mmol/L — ABNORMAL LOW (ref 98–111)
Creatinine, Ser: 0.77 mg/dL (ref 0.61–1.24)
GFR, Estimated: >60 mL/min (ref >60)
Glucose, Bld: 152 mg/dL — ABNORMAL HIGH (ref 70–99)
Potassium: 4 mmol/L (ref 3.5–5.1)
Sodium: 132 mmol/L — ABNORMAL LOW (ref 135–145)

## 2021-10-31 LAB — GLUCOSE, CAPILLARY: Glucose-Capillary: 149 mg/dL — ABNORMAL HIGH (ref 70–99)

## 2021-10-31 LAB — PROTIME-INR
INR: 2.2 — ABNORMAL HIGH (ref 0.8–1.2)
Prothrombin Time: 24.6 seconds — ABNORMAL HIGH (ref 11.4–15.2)

## 2021-10-31 MED ORDER — OXYCODONE-ACETAMINOPHEN 10-325 MG PO TABS: 1.0000 | ORAL_TABLET | ORAL | 0 refills | Status: DC | PRN

## 2021-10-31 NOTE — Progress Notes (Signed)
Order to discharge pt home.  Discharge instructions/AVS given to patient and reviewed - education provided as needed.  Wound vac changed to Prevena. Education provided.  Pt verbalized understanding and performed teach back.  Pt advised to call PCP and/or come back to the hospital if there are any problems. Pt verbalized understanding.

## 2021-10-31 NOTE — Progress Notes (Signed)
ANTICOAGULATION CONSULT NOTE - Initial Consult  Pharmacy Consult for coumadin Indication:  Mechanical AVR  Allergies  Allergen Reactions   Pollen Extract Other (See Comments)    Runny nose, watery eyes, sneezing    Patient Measurements: Height: 5\' 10"  (177.8 cm) Weight: (!) 158.8 kg (350 lb) IBW/kg (Calculated) : 73 Heparin Dosing Weight:   Vital Signs: Temp: 98.1 F (36.7 C) (02/04 0740) Temp Source: Oral (02/04 0740) BP: 106/64 (02/04 0740) Pulse Rate: 97 (02/04 0740)  Labs: Recent Labs    10/29/21 1351 10/30/21 0917 10/31/21 0550  HGB 12.6*  --  10.2*  HCT 40.9  --  31.3*  PLT 285  --  223  LABPROT  --  20.6* 24.6*  INR  --  1.8* 2.2*  CREATININE 0.71  --  0.77     Estimated Creatinine Clearance: 177 mL/min (by C-G formula based on SCr of 0.77 mg/dL).   Medical History: Past Medical History:  Diagnosis Date   Anxiety    Aortic stenosis    Cellulitis and abscess of lower extremity 06/11/2019   Cellulitis of fourth toe of left foot    Cholelithiasis    Coronary artery disease    Nonobstructive CAD (40-50% LAD) 08/2019   Depression    Depression    Phreesia 09/27/2020   Depression    Phreesia 11/01/2020   Diabetes mellitus without complication (HCC)    Phreesia 09/27/2020   Elevated troponin level not due myocardial infarction 11/11/2019   Essential hypertension    Gangrene of toe of left foot (HCC) 07/06/2019   Heart murmur    Phreesia 09/27/2020   Hyperlipidemia    Phreesia 09/27/2020   Hypertension    Phreesia 09/27/2020   Mixed hyperlipidemia    Morbid obesity (HCC)    S/P aortic valve replacement with mechanical valve 12/05/2019   25 mm Carbomedics top hat bileaflet mechanical valve via partial upper hemi-sternotomy   Severe aortic stenosis 09/24/2019   Type 2 diabetes mellitus (HCC)     Medications:  Medications Prior to Admission  Medication Sig Dispense Refill Last Dose   acetaminophen (TYLENOL) 325 MG tablet Take 1-2 tablets  (325-650 mg total) by mouth every 4 (four) hours as needed for mild pain.   Past Week   atorvastatin (LIPITOR) 80 MG tablet Take 1 tablet (80 mg total) by mouth daily. (Patient taking differently: Take 80 mg by mouth every evening.) 90 tablet 3 10/29/2021   doxycycline (VIBRA-TABS) 100 MG tablet Take 1 tablet (100 mg total) by mouth 2 (two) times daily. 60 tablet 0 10/30/2021 at 0630   gabapentin (NEURONTIN) 300 MG capsule Take 2 capsules (600 mg total) by mouth 2 (two) times daily. 120 capsule 1 10/30/2021 at 0630   lisinopril (ZESTRIL) 10 MG tablet Take 1 tablet by mouth once daily 30 tablet 0 10/29/2021   metFORMIN (GLUCOPHAGE) 500 MG tablet Take 1 tablet (500 mg total) by mouth 2 (two) times daily with a meal. 60 tablet 3 10/29/2021   metoprolol tartrate (LOPRESSOR) 25 MG tablet Take 1 tablet (25 mg total) by mouth 2 (two) times daily. 60 tablet 0 10/30/2021 at 0630   Oxycodone HCl 10 MG TABS Take 1 tablet (10 mg total) by mouth every 8 (eight) hours as needed. 90 tablet 0 Past Week   pantoprazole (PROTONIX) 40 MG tablet Take 1 tablet by mouth once daily 30 tablet 0 10/30/2021 at 0630   sertraline (ZOLOFT) 50 MG tablet Take 1 tablet by mouth once daily 30 tablet 0  10/30/2021 at 0630   warfarin (COUMADIN) 10 MG tablet Take 2 tablets (20 mg total) by mouth daily at 4 PM. (Patient taking differently: Take 10 mg by mouth daily at 4 PM.) 60 tablet 0 10/28/2021 at 2000   Amino Acids-Protein Hydrolys (FEEDING SUPPLEMENT, PRO-STAT SUGAR FREE 64,) LIQD Take 30 mLs by mouth 3 (three) times daily with meals. 3000 mL 0    Scheduled:   (feeding supplement) PROSource Plus  30 mL Oral TID BM   vitamin C  1,000 mg Oral Daily   atorvastatin  80 mg Oral Daily   docusate sodium  100 mg Oral Daily   gabapentin  600 mg Oral BID   insulin aspart  0-15 Units Subcutaneous TID WC   lisinopril  10 mg Oral Daily   metFORMIN  500 mg Oral BID WC   metoprolol tartrate  25 mg Oral BID   nutrition supplement (JUVEN)  1 packet Oral BID BM    pantoprazole  40 mg Oral Daily   sertraline  50 mg Oral Daily   Warfarin - Pharmacist Dosing Inpatient   Does not apply q1600   zinc sulfate  220 mg Oral Daily   Infusions:   sodium chloride Stopped (10/30/21 1424)    ceFAZolin (ANCEF) IV Stopped (10/31/21 0630)   magnesium sulfate bolus IVPB      Assessment: Pt with a hx of mechanical AVR who was admitted for dehiscence left BKA. He is s/p revision. Coumadin has been ordered to be resumed. Plan to dc home today on home dose.  PTA coumadin - 10mg  qday last dose 2/1 INR 2.2  Goal of Therapy:  INR 2-3 Monitor platelets by anticoagulation protocol: Yes   Plan:  Coumadin 10mg  PO qday Daily INR  , PharmD, BCIDP, AAHIVP, CPP Infectious Disease Pharmacist 10/31/2021 10:45 AM

## 2021-10-31 NOTE — Plan of Care (Signed)

## 2021-10-31 NOTE — Discharge Summary (Signed)
Discharge Diagnoses:  Principal Problem:   S/P BKA (below knee amputation), left (HCC) Active Problems:   History of below knee amputation, left (HCC)   Dehiscence of amputation stump of left lower extremity (HCC)   Surgeries: Procedure(s): REVISION LEFT BELOW KNEE AMPUTATION APPLICATION OF WOUND VAC on 10/30/2021    Consultants:   Discharged Condition: Improved  Hospital Course: Adam Long is an 46 y.o. male who was admitted 10/30/2021 with a chief complaint of dehiscence left below-knee amputation, with a final diagnosis of Dehiscence Left Below Knee Amputation.  Patient was brought to the operating room on 10/30/2021 and underwent Procedure(s): REVISION LEFT BELOW KNEE AMPUTATION APPLICATION OF WOUND VAC.    Patient was given perioperative antibiotics:  Anti-infectives (From admission, onward)    Start     Dose/Rate Route Frequency Ordered Stop   10/30/21 1800  ceFAZolin (ANCEF) IVPB 2g/100 mL premix  Status:  Discontinued        2 g 200 mL/hr over 30 Minutes Intravenous Every 8 hours 10/30/21 1152 10/30/21 1251   10/30/21 1800  ceFAZolin (ANCEF) IVPB 2g/100 mL premix        2 g 200 mL/hr over 30 Minutes Intravenous Every 8 hours 10/30/21 1251     10/30/21 1330  ceFAZolin (ANCEF) IVPB 2g/100 mL premix  Status:  Discontinued        2 g 200 mL/hr over 30 Minutes Intravenous Every 8 hours 10/30/21 1238 10/30/21 1239   10/30/21 1000  ceFAZolin (ANCEF) IVPB 3g/100 mL premix        3 g 200 mL/hr over 30 Minutes Intravenous On call to O.R. 10/30/21 9449 10/30/21 1100     .  Patient was given sequential compression devices, early ambulation, and aspirin for DVT prophylaxis.  Recent vital signs: Patient Vitals for the past 24 hrs:  BP Temp Temp src Pulse Resp SpO2  10/31/21 0740 106/64 98.1 F (36.7 C) Oral 97 17 96 %  10/31/21 0446 117/69 98.4 F (36.9 C) Oral 91 20 97 %  10/30/21 2208 (!) 118/58 97.8 F (36.6 C) Oral 94 20 --  10/30/21 1538 128/61 (!) 97.5 F (36.4 C)  Oral 93 -- 98 %  10/30/21 1248 137/68 97.6 F (36.4 C) Oral 85 -- 98 %  10/30/21 1215 116/65 98.2 F (36.8 C) -- 86 14 95 %  10/30/21 1200 110/61 -- -- 81 17 95 %  10/30/21 1145 (!) 109/58 -- -- 82 19 96 %  10/30/21 1130 106/60 99.7 F (37.6 C) -- 88 15 95 %  10/30/21 0950 (!) 147/66 -- -- 84 16 100 %  10/30/21 0945 (!) 152/63 -- -- 88 19 100 %  10/30/21 0940 (!) 156/71 -- -- 85 16 100 %  10/30/21 0935 (!) 151/72 -- -- 83 (!) 25 100 %  10/30/21 0930 (!) 152/71 -- -- 83 15 100 %  10/30/21 0925 (!) 154/89 -- -- -- 19 --  .  Recent laboratory studies: No results found.  Discharge Medications:   Allergies as of 10/31/2021       Reactions   Pollen Extract Other (See Comments)   Runny nose, watery eyes, sneezing        Medication List     STOP taking these medications    acetaminophen 325 MG tablet Commonly known as: TYLENOL   Oxycodone HCl 10 MG Tabs       TAKE these medications    atorvastatin 80 MG tablet Commonly known as: LIPITOR Take 1 tablet (80 mg  total) by mouth daily. What changed: when to take this   doxycycline 100 MG tablet Commonly known as: VIBRA-TABS Take 1 tablet (100 mg total) by mouth 2 (two) times daily.   feeding supplement (PRO-STAT SUGAR FREE 64) Liqd Take 30 mLs by mouth 3 (three) times daily with meals.   gabapentin 300 MG capsule Commonly known as: NEURONTIN Take 2 capsules (600 mg total) by mouth 2 (two) times daily.   lisinopril 10 MG tablet Commonly known as: ZESTRIL Take 1 tablet by mouth once daily   metFORMIN 500 MG tablet Commonly known as: GLUCOPHAGE Take 1 tablet (500 mg total) by mouth 2 (two) times daily with a meal.   metoprolol tartrate 25 MG tablet Commonly known as: LOPRESSOR Take 1 tablet (25 mg total) by mouth 2 (two) times daily.   oxyCODONE-acetaminophen 10-325 MG tablet Commonly known as: PERCOCET Take 1 tablet by mouth every 4 (four) hours as needed for pain.   pantoprazole 40 MG tablet Commonly known  as: PROTONIX Take 1 tablet by mouth once daily   sertraline 50 MG tablet Commonly known as: ZOLOFT Take 1 tablet by mouth once daily   warfarin 10 MG tablet Commonly known as: COUMADIN Take as directed. If you are unsure how to take this medication, talk to your nurse or doctor. Original instructions: Take 2 tablets (20 mg total) by mouth daily at 4 PM. What changed: how much to take        Diagnostic Studies: No results found.  Patient benefited maximally from their hospital stay and there were no complications.     Disposition: Discharge disposition: 01-Home or Self Care      Discharge Instructions     Call MD / Call 911   Complete by: As directed    If you experience chest pain or shortness of breath, CALL 911 and be transported to the hospital emergency room.  If you develope a fever above 101 F, pus (white drainage) or increased drainage or redness at the wound, or calf pain, call your surgeon's office.   Constipation Prevention   Complete by: As directed    Drink plenty of fluids.  Prune juice may be helpful.  You may use a stool softener, such as Colace (over the counter) 100 mg twice a day.  Use MiraLax (over the counter) for constipation as needed.   Diet - low sodium heart healthy   Complete by: As directed    Increase activity slowly as tolerated   Complete by: As directed    Negative Pressure Wound Therapy - Incisional   Complete by: As directed    Discharge with the Praveena plus portable wound VAC pump.   Post-operative opioid taper instructions:   Complete by: As directed    POST-OPERATIVE OPIOID TAPER INSTRUCTIONS: It is important to wean off of your opioid medication as soon as possible. If you do not need pain medication after your surgery it is ok to stop day one. Opioids include: Codeine, Hydrocodone(Norco, Vicodin), Oxycodone(Percocet, oxycontin) and hydromorphone amongst others.  Long term and even short term use of opiods can cause: Increased  pain response Dependence Constipation Depression Respiratory depression And more.  Withdrawal symptoms can include Flu like symptoms Nausea, vomiting And more Techniques to manage these symptoms Hydrate well Eat regular healthy meals Stay active Use relaxation techniques(deep breathing, meditating, yoga) Do Not substitute Alcohol to help with tapering If you have been on opioids for less than two weeks and do not have pain than it is  ok to stop all together.  Plan to wean off of opioids This plan should start within one week post op of your joint replacement. Maintain the same interval or time between taking each dose and first decrease the dose.  Cut the total daily intake of opioids by one tablet each day Next start to increase the time between doses. The last dose that should be eliminated is the evening dose.          Follow-up Information     Nadara Mustarduda, Farran Amsden V, MD Follow up in 1 week(s).   Specialty: Orthopedic Surgery Contact information: [address] [place] [address] [phone]                  Signed: Nadara MustardMarcus V Havanna Groner 10/31/2021, 8:57 AM

## 2021-11-01 ENCOUNTER — Other Ambulatory Visit: Payer: Self-pay | Admitting: Orthopedic Surgery

## 2021-11-01 MED ORDER — CIPROFLOXACIN HCL 500 MG PO TABS: 500.0000 mg | ORAL_TABLET | Freq: Two times a day (BID) | ORAL | 0 refills | Status: DC

## 2021-11-03 ENCOUNTER — Other Ambulatory Visit: Payer: Self-pay

## 2021-11-03 ENCOUNTER — Ambulatory Visit (INDEPENDENT_AMBULATORY_CARE_PROVIDER_SITE_OTHER): Payer: 59 | Admitting: *Deleted

## 2021-11-03 ENCOUNTER — Telehealth: Payer: Self-pay

## 2021-11-03 DIAGNOSIS — Z5181 Encounter for therapeutic drug level monitoring: Secondary | ICD-10-CM

## 2021-11-03 DIAGNOSIS — Z952 Presence of prosthetic heart valve: Secondary | ICD-10-CM | POA: Diagnosis not present

## 2021-11-03 LAB — POCT INR: INR: 1.8 — AB (ref 2.0–3.0)

## 2021-11-03 NOTE — Telephone Encounter (Signed)
I called and sw pt to advise to leave the vac intact. If it happens to alarm he can turn the device off and we will remove in the office tomorrow. Pt voiced understanding and will call with any other questions.

## 2021-11-03 NOTE — Telephone Encounter (Signed)
Patient called triage. He had revision surgery last week on his stump with Dr.Duda. He said that he went home from the hospital Saturday with a wound vac and 1 extra canister. The second canister is almost full. He wants to know what to do. He is scheduled to see [facility] tomorrow, but feels the canister will be full by that point and the machine will start to beep. Please call him at [phone] Thanks!

## 2021-11-03 NOTE — Patient Instructions (Signed)
On Doxycycline 100mg  bid x 30 days and Cipro 500mg  bid x 30 days Continue warfarin 1 tablet daily except 1/2 tablet on Thursdays Recheck in 1 wk

## 2021-11-04 ENCOUNTER — Ambulatory Visit (INDEPENDENT_AMBULATORY_CARE_PROVIDER_SITE_OTHER): Payer: 59 | Admitting: Family

## 2021-11-04 DIAGNOSIS — S88112A Complete traumatic amputation at level between knee and ankle, left lower leg, initial encounter: Secondary | ICD-10-CM

## 2021-11-04 DIAGNOSIS — Z89512 Acquired absence of left leg below knee: Secondary | ICD-10-CM

## 2021-11-04 NOTE — Progress Notes (Signed)
Post-Op Visit Note   Patient: Adam Long           Date of Birth: [birth year]           MRN: [id number] Visit Date: 11/04/2021 PCP: Heather Roberts, NP  Chief Complaint:  Chief Complaint  Patient presents with   Left Leg - Routine Post Op    10/30/2021 revision left BKA     HPI:  HPI The patient is a 46 year old gentleman who presents status post revision left below-knee amputation his wound VAC was removed today Ortho Exam Incision is well approximated with sutures there is significant edema there is no gaping no active drainage no surrounding erythema  Visit Diagnoses: No diagnosis found.  Plan: Begin daily Dial soap cleansing.  Dry dressing changes.  Follow-up in the office in 2 weeks.  Follow-Up Instructions: No follow-ups on file.   Imaging: No results found.  Orders:  No orders of the defined types were placed in this encounter.  No orders of the defined types were placed in this encounter.    PMFS History: Patient Active Problem List   Diagnosis Date Noted   S/P BKA (below knee amputation), left (HCC) 10/30/2021   History of below knee amputation, left (HCC) 10/30/2021   Dehiscence of amputation stump of left lower extremity (HCC)    Below-knee amputation of left lower extremity (HCC) 09/02/2021   Severe protein-calorie malnutrition (HCC)    MSSA bacteremia    Ulcer of left foot (HCC)    Subacute osteomyelitis, left ankle and foot (HCC)    Diabetic foot infection (HCC) 02/14/2021   Diabetic ulcer of left foot associated with type 1 diabetes mellitus, limited to breakdown of skin (HCC)    Leukocytosis 02/13/2021   Hyponatremia 02/13/2021   Normocytic anemia 02/13/2021   Hyperglycemia due to diabetes mellitus (HCC) 02/13/2021   Open wound of left foot 02/12/2021   Left leg swelling 02/02/2021   History of complete ray amputation of fifth toe of left foot (HCC) 11/04/2020   Immunization due 11/04/2020   Anxiety 09/30/2020   Preventative health care  12/13/2019   S/P aortic valve replacement with mechanical valve 12/05/2019   S/P AVR (aortic valve replacement) 12/05/2019   Syncope 11/10/2019   Personal history of noncompliance with medical treatment, presenting hazards to health 03/16/2018   Uncontrolled diabetes mellitus with hyperglycemia, without long-term current use of insulin (HCC) 10/12/2015   Hyperlipidemia associated with type 2 diabetes mellitus (HCC) 10/12/2015   Morbid obesity (HCC) 10/12/2015   Chest pain 07/02/2013   HTN (hypertension) 07/02/2013   Past Medical History:  Diagnosis Date   Anxiety    Aortic stenosis    Cellulitis and abscess of lower extremity 06/11/2019   Cellulitis of fourth toe of left foot    Cholelithiasis    Coronary artery disease    Nonobstructive CAD (40-50% LAD) 08/2019   Depression    Depression    Phreesia 09/27/2020   Depression    Phreesia 11/01/2020   Diabetes mellitus without complication (HCC)    Phreesia 09/27/2020   Elevated troponin level not due myocardial infarction 11/11/2019   Essential hypertension    Gangrene of toe of left foot (HCC) 07/06/2019   Heart murmur    Phreesia 09/27/2020   Hyperlipidemia    Phreesia 09/27/2020   Hypertension    Phreesia 09/27/2020   Mixed hyperlipidemia    Morbid obesity (HCC)    S/P aortic valve replacement with mechanical valve 12/05/2019   25 mm  Carbomedics top hat bileaflet mechanical valve via partial upper hemi-sternotomy   Severe aortic stenosis 09/24/2019   Type 2 diabetes mellitus (HCC)     Family History  Problem Relation Age of Onset   Cancer Mother        Brain   Heart disease Father    Hyperlipidemia Father    Hypertension Father    Stroke Father    Heart murmur Sister     Past Surgical History:  Procedure Laterality Date   ABDOMINAL AORTOGRAM W/LOWER EXTREMITY N/A 07/06/2019   Procedure: ABDOMINAL AORTOGRAM W/LOWER EXTREMITY;  Surgeon: Sherren Kerns, MD;  Location: [facility] INVASIVE CV LAB;  Service:  Cardiovascular;  Laterality: N/A;  Bilateral   AMPUTATION Left 07/09/2019   Procedure: LEFT FOURTH and Fifth TOE AMPUTATION.;  Surgeon: Larina Earthly, MD;  Location: [facility] OR;  Service: Vascular;  Laterality: Left;   AMPUTATION Left 03/11/2021   Procedure: LEFT FOOT 5TH  AND 4TH RAY AMPUTATION;  Surgeon: Nadara Mustard, MD;  Location: [facility] OR;  Service: Orthopedics;  Laterality: Left;   AMPUTATION Left 08/28/2021   Procedure: AMPUTATION BELOW KNEE;  Surgeon: Nadara Mustard, MD;  Location: [facility] OR;  Service: Orthopedics;  Laterality: Left;   AORTIC VALVE REPLACEMENT N/A 12/05/2019   Procedure: PARTIAL STERNOTOMY FOR AORTIC VALVE REPLACEMENT (AVR), USING CARBOMEDICS SUPRA-ANNULAR TOP HAT ;  Surgeon: Purcell Nails, MD;  Location: [facility] OR;  Service: Open Heart Surgery;  Laterality: N/A;  No neck lines on left   APPLICATION OF WOUND VAC Left 10/30/2021   Procedure: APPLICATION OF WOUND VAC;  Surgeon: Nadara Mustard, MD;  Location: [facility] OR;  Service: Orthopedics;  Laterality: Left;   CARDIAC VALVE REPLACEMENT N/A    Phreesia 09/27/2020   IR RADIOLOGY PERIPHERAL GUIDED IV START  10/05/2019   IR US GUIDE VASC ACCESS RIGHT  10/05/2019   MULTIPLE EXTRACTIONS WITH ALVEOLOPLASTY N/A 10/26/2019   Procedure: EXTRACTION OF TOOTH #'S 3, 5-11,19-28,  AND 32 WITH ALVEOLOPLASTY;  Surgeon: Charlynne Pander, DDS;  Location: [facility] OR;  Service: Oral Surgery;  Laterality: N/A;   RIGHT HEART CATH AND CORONARY ANGIOGRAPHY N/A 09/24/2019   Procedure: RIGHT HEART CATH AND CORONARY ANGIOGRAPHY;  Surgeon: Tonny Bollman, MD;  Location: [facility] INVASIVE CV LAB;  Service: Cardiovascular;  Laterality: N/A;   STUMP REVISION Left 10/30/2021   Procedure: REVISION LEFT BELOW KNEE AMPUTATION;  Surgeon: Nadara Mustard, MD;  Location[facility] OR;  Service: Orthopedics;  Laterality: Left;   TEE WITHOUT CARDIOVERSION N/A 12/05/2019   Procedure: TRANSESOPHAGEAL ECHOCARDIOGRAM (TEE);  Surgeon: Purcell Nails, MD;  Location: [facility] OR;  Service: Open Heart Surgery;   Laterality: N/A;   TEE WITHOUT CARDIOVERSION N/A 09/01/2021   Procedure: TRANSESOPHAGEAL ECHOCARDIOGRAM (TEE);  Surgeon: Little Ishikawa, MD;  Location: [facility] ENDOSCOPY;  Service: Cardiovascular;  Laterality: N/A;   Social History   Occupational History    Comment: Glass blower/designer- self-employed  Tobacco Use   Smoking status: Never   Smokeless tobacco: Former    Types: Chew    Quit date: 2021  Vaping Use   Vaping Use: Never used  Substance and Sexual Activity   Alcohol use: Yes    Comment: occasionally   Drug use: No   Sexual activity: Yes    Birth control/protection: None

## 2021-11-05 ENCOUNTER — Other Ambulatory Visit: Payer: Self-pay | Admitting: Physical Medicine and Rehabilitation

## 2021-11-05 LAB — AEROBIC/ANAEROBIC CULTURE W GRAM STAIN (SURGICAL/DEEP WOUND)

## 2021-11-11 ENCOUNTER — Ambulatory Visit (INDEPENDENT_AMBULATORY_CARE_PROVIDER_SITE_OTHER): Payer: 59 | Admitting: *Deleted

## 2021-11-11 DIAGNOSIS — Z5181 Encounter for therapeutic drug level monitoring: Secondary | ICD-10-CM

## 2021-11-11 DIAGNOSIS — Z952 Presence of prosthetic heart valve: Secondary | ICD-10-CM

## 2021-11-11 LAB — POCT INR: INR: 2.4 (ref 2.0–3.0)

## 2021-11-11 NOTE — Patient Instructions (Signed)
On Doxycycline 100mg  bid x 30 days and Cipro 500mg  bid x 30 days Continue warfarin 1 tablet daily except 1/2 tablet on Thursdays Recheck in 2 wk

## 2021-11-18 ENCOUNTER — Encounter: Payer: Self-pay | Admitting: Family

## 2021-11-18 ENCOUNTER — Other Ambulatory Visit: Payer: Self-pay | Admitting: Physical Medicine and Rehabilitation

## 2021-11-18 ENCOUNTER — Ambulatory Visit (INDEPENDENT_AMBULATORY_CARE_PROVIDER_SITE_OTHER): Payer: 59 | Admitting: Family

## 2021-11-18 ENCOUNTER — Other Ambulatory Visit: Payer: Self-pay

## 2021-11-18 DIAGNOSIS — Z89512 Acquired absence of left leg below knee: Secondary | ICD-10-CM

## 2021-11-18 DIAGNOSIS — F419 Anxiety disorder, unspecified: Secondary | ICD-10-CM

## 2021-11-18 DIAGNOSIS — S88112A Complete traumatic amputation at level between knee and ankle, left lower leg, initial encounter: Secondary | ICD-10-CM

## 2021-11-18 NOTE — Progress Notes (Signed)
Post-Op Visit Note   Patient: Adam Long           Date of Birth: 1976/03/27           MRN: [id number] Visit Date: 11/18/2021 PCP: Heather Roberts, NP (Inactive)  Chief Complaint:  Chief Complaint  Patient presents with   Left Leg - Routine Post Op    10/30/21 revision left BKA    HPI:  HPI Patient is a 46 year old gentleman seen status post left below-knee amputation revision on February 3 Ortho Exam On examination his his incision is approximated with sutures and staples there is no gaping he does have 2 areas with what appears to be proud hematoma there is no active drainage no erythema no warmth  Visit Diagnoses:  1. Below-knee amputation of left lower extremity (HCC)     Plan: Some staples harvested today he will continue daily Dial soap cleansing and dry dressing changes.  Continue with shrinker follow-up in 2 more weeks for staple and suture removal  Follow-Up Instructions: Return in about 2 weeks (around 12/02/2021).   Imaging: No results found.  Orders:  No orders of the defined types were placed in this encounter.  No orders of the defined types were placed in this encounter.    PMFS History: Patient Active Problem List   Diagnosis Date Noted   S/P BKA (below knee amputation), left (HCC) 10/30/2021   History of below knee amputation, left (HCC) 10/30/2021   Dehiscence of amputation stump of left lower extremity (HCC)    Below-knee amputation of left lower extremity (HCC) 09/02/2021   Severe protein-calorie malnutrition (HCC)    MSSA bacteremia    Ulcer of left foot (HCC)    Subacute osteomyelitis, left ankle and foot (HCC)    Diabetic foot infection (HCC) 02/14/2021   Diabetic ulcer of left foot associated with type 1 diabetes mellitus, limited to breakdown of skin (HCC)    Leukocytosis 02/13/2021   Hyponatremia 02/13/2021   Normocytic anemia 02/13/2021   Hyperglycemia due to diabetes mellitus (HCC) 02/13/2021   Open wound of left foot 02/12/2021    Left leg swelling 02/02/2021   History of complete ray amputation of fifth toe of left foot (HCC) 11/04/2020   Immunization due 11/04/2020   Anxiety 09/30/2020   Preventative health care 12/13/2019   S/P aortic valve replacement with mechanical valve 12/05/2019   S/P AVR (aortic valve replacement) 12/05/2019   Syncope 11/10/2019   Personal history of noncompliance with medical treatment, presenting hazards to health 03/16/2018   Uncontrolled diabetes mellitus with hyperglycemia, without long-term current use of insulin (HCC) 10/12/2015   Hyperlipidemia associated with type 2 diabetes mellitus (HCC) 10/12/2015   Morbid obesity (HCC) 10/12/2015   Chest pain 07/02/2013   HTN (hypertension) 07/02/2013   Past Medical History:  Diagnosis Date   Anxiety    Aortic stenosis    Cellulitis and abscess of lower extremity 06/11/2019   Cellulitis of fourth toe of left foot    Cholelithiasis    Coronary artery disease    Nonobstructive CAD (40-50% LAD) 08/2019   Depression    Depression    Phreesia 09/27/2020   Depression    Phreesia 11/01/2020   Diabetes mellitus without complication (HCC)    Phreesia 09/27/2020   Elevated troponin level not due myocardial infarction 11/11/2019   Essential hypertension    Gangrene of toe of left foot (HCC) 07/06/2019   Heart murmur    Phreesia 09/27/2020   Hyperlipidemia  Phreesia 09/27/2020   Hypertension    Phreesia 09/27/2020   Mixed hyperlipidemia    Morbid obesity (HCC)    S/P aortic valve replacement with mechanical valve 12/05/2019   25 mm Carbomedics top hat bileaflet mechanical valve via partial upper hemi-sternotomy   Severe aortic stenosis 09/24/2019   Type 2 diabetes mellitus (HCC)     Family History  Problem Relation Age of Onset   Cancer Mother        Brain   Heart disease Father    Hyperlipidemia Father    Hypertension Father    Stroke Father    Heart murmur Sister     Past Surgical History:  Procedure Laterality Date    ABDOMINAL AORTOGRAM W/LOWER EXTREMITY N/A 07/06/2019   Procedure: ABDOMINAL AORTOGRAM W/LOWER EXTREMITY;  Surgeon: Sherren Kerns, MD;  Location: [facility] INVASIVE CV LAB;  Service: Cardiovascular;  Laterality: N/A;  Bilateral   AMPUTATION Left 07/09/2019   Procedure: LEFT FOURTH and Fifth TOE AMPUTATION.;  Surgeon: Larina Earthly, MD;  Location: [facility] OR;  Service: Vascular;  Laterality: Left;   AMPUTATION Left 03/11/2021   Procedure: LEFT FOOT 5TH  AND 4TH RAY AMPUTATION;  Surgeon: Nadara Mustard, MD;  Location: [facility] OR;  Service: Orthopedics;  Laterality: Left;   AMPUTATION Left 08/28/2021   Procedure: AMPUTATION BELOW KNEE;  Surgeon: Nadara Mustard, MD;  Location: [facility] OR;  Service: Orthopedics;  Laterality: Left;   AORTIC VALVE REPLACEMENT N/A 12/05/2019   Procedure: PARTIAL STERNOTOMY FOR AORTIC VALVE REPLACEMENT (AVR), USING CARBOMEDICS SUPRA-ANNULAR TOP HAT ;  Surgeon: Purcell Nails, MD;  Location: [facility] OR;  Service: Open Heart Surgery;  Laterality: N/A;  No neck lines on left   APPLICATION OF WOUND VAC Left 10/30/2021   Procedure: APPLICATION OF WOUND VAC;  Surgeon: Nadara Mustard, MD;  Location: [facility] OR;  Service: Orthopedics;  Laterality: Left;   CARDIAC VALVE REPLACEMENT N/A    Phreesia 09/27/2020   IR RADIOLOGY PERIPHERAL GUIDED IV START  10/05/2019   IR US GUIDE VASC ACCESS RIGHT  10/05/2019   MULTIPLE EXTRACTIONS WITH ALVEOLOPLASTY N/A 10/26/2019   Procedure: EXTRACTION OF TOOTH #'S 3, 5-11,19-28,  AND 32 WITH ALVEOLOPLASTY;  Surgeon: Charlynne Pander, DDS;  Location: [facility] OR;  Service: Oral Surgery;  Laterality: N/A;   RIGHT HEART CATH AND CORONARY ANGIOGRAPHY N/A 09/24/2019   Procedure: RIGHT HEART CATH AND CORONARY ANGIOGRAPHY;  Surgeon: Tonny Bollman, MD;  Location: [facility] INVASIVE CV LAB;  Service: Cardiovascular;  Laterality: N/A;   STUMP REVISION Left 10/30/2021   Procedure: REVISION LEFT BELOW KNEE AMPUTATION;  Surgeon: Nadara Mustard, MD;  Location: [facility] OR;  Service: Orthopedics;  Laterality:  Left;   TEE WITHOUT CARDIOVERSION N/A 12/05/2019   Procedure: TRANSESOPHAGEAL ECHOCARDIOGRAM (TEE);  Surgeon: Purcell Nails, MD;  Location: [facility] OR;  Service: Open Heart Surgery;  Laterality: N/A;   TEE WITHOUT CARDIOVERSION N/A 09/01/2021   Procedure: TRANSESOPHAGEAL ECHOCARDIOGRAM (TEE);  Surgeon: Little Ishikawa, MD;  Location: [facility] ENDOSCOPY;  Service: Cardiovascular;  Laterality: N/A;   Social History   Occupational History    Comment: Glass blower/designer- self-employed  Tobacco Use   Smoking status: Never   Smokeless tobacco: Former    Types: Chew    Quit date: 2021  Vaping Use   Vaping Use: Never used  Substance and Sexual Activity   Alcohol use: Yes    Comment: occasionally   Drug use: No   Sexual activity: Yes    Birth control/protection: None

## 2021-11-24 ENCOUNTER — Other Ambulatory Visit: Payer: Self-pay

## 2021-11-24 ENCOUNTER — Ambulatory Visit: Payer: 59

## 2021-11-24 LAB — HM DIABETES EYE EXAM

## 2021-11-25 ENCOUNTER — Ambulatory Visit (INDEPENDENT_AMBULATORY_CARE_PROVIDER_SITE_OTHER): Payer: 59 | Admitting: *Deleted

## 2021-11-25 DIAGNOSIS — Z5181 Encounter for therapeutic drug level monitoring: Secondary | ICD-10-CM

## 2021-11-25 DIAGNOSIS — Z952 Presence of prosthetic heart valve: Secondary | ICD-10-CM

## 2021-11-25 LAB — POCT INR: INR: 3 (ref 2.0–3.0)

## 2021-11-25 NOTE — Patient Instructions (Signed)
Continue warfarin 1 tablet daily except 1/2 tablet on Thursdays ?Recheck in 2 wk ?

## 2021-12-02 ENCOUNTER — Encounter: Payer: Self-pay | Admitting: Family

## 2021-12-02 ENCOUNTER — Ambulatory Visit (INDEPENDENT_AMBULATORY_CARE_PROVIDER_SITE_OTHER): Payer: 59 | Admitting: Family

## 2021-12-02 ENCOUNTER — Other Ambulatory Visit: Payer: Self-pay | Admitting: Physical Medicine and Rehabilitation

## 2021-12-02 DIAGNOSIS — S88112A Complete traumatic amputation at level between knee and ankle, left lower leg, initial encounter: Secondary | ICD-10-CM

## 2021-12-02 DIAGNOSIS — Z89512 Acquired absence of left leg below knee: Secondary | ICD-10-CM

## 2021-12-02 NOTE — Progress Notes (Signed)
? ?Post-Op Visit Note ?  ?Patient: Adam Long           ?Date of Birth: 20-Mar-1976           ?MRN: [id number] ?Visit Date: 12/02/2021 ?PCP: Heather Roberts, NP (Inactive) ? ?Chief Complaint:  ?Chief Complaint  ?Patient presents with  ? Left Leg - Routine Post Op  ?  10/30/21 left BKA revision  ? ? ?HPI:  ?HPI ?The patient is a 46 year old gentleman seen status post revision of the left below-knee amputation February 3 he continues to have issues with edema and drainage ?Ortho Exam ?On examination of the left residual limb sutures are in place as well as a few staples there is no dehiscence no active drainage there is very mild erythema and dermatitis from scratching ? ?Visit Diagnoses: No diagnosis found. ? ?Plan: We will harvest staples and some of his sutures he will continue with daily Dial soap cleansing and dry dressings shrinker for compression discussed elevating and not having the leg dependent during the day. ? ?Follow-Up Instructions: No follow-ups on file.  ? ?Imaging: ?No results found. ? ?Orders:  ?No orders of the defined types were placed in this encounter. ? ?No orders of the defined types were placed in this encounter. ? ? ? ?PMFS History: ?Patient Active Problem List  ? Diagnosis Date Noted  ? S/P BKA (below knee amputation), left (HCC) 10/30/2021  ? History of below knee amputation, left (HCC) 10/30/2021  ? Dehiscence of amputation stump of left lower extremity (HCC)   ? Below-knee amputation of left lower extremity (HCC) 09/02/2021  ? Severe protein-calorie malnutrition (HCC)   ? MSSA bacteremia   ? Ulcer of left foot (HCC)   ? Subacute osteomyelitis, left ankle and foot (HCC)   ? Diabetic foot infection (HCC) 02/14/2021  ? Diabetic ulcer of left foot associated with type 1 diabetes mellitus, limited to breakdown of skin (HCC)   ? Leukocytosis 02/13/2021  ? Hyponatremia 02/13/2021  ? Normocytic anemia 02/13/2021  ? Hyperglycemia due to diabetes mellitus (HCC) 02/13/2021  ? Open wound of left  foot 02/12/2021  ? Left leg swelling 02/02/2021  ? History of complete ray amputation of fifth toe of left foot (HCC) 11/04/2020  ? Immunization due 11/04/2020  ? Anxiety 09/30/2020  ? Preventative health care 12/13/2019  ? S/P aortic valve replacement with mechanical valve 12/05/2019  ? S/P AVR (aortic valve replacement) 12/05/2019  ? Syncope 11/10/2019  ? Personal history of noncompliance with medical treatment, presenting hazards to health 03/16/2018  ? Uncontrolled diabetes mellitus with hyperglycemia, without long-term current use of insulin (HCC) 10/12/2015  ? Hyperlipidemia associated with type 2 diabetes mellitus (HCC) 10/12/2015  ? Morbid obesity (HCC) 10/12/2015  ? Chest pain 07/02/2013  ? HTN (hypertension) 07/02/2013  ? ?Past Medical History:  ?Diagnosis Date  ? Anxiety   ? Aortic stenosis   ? Cellulitis and abscess of lower extremity 06/11/2019  ? Cellulitis of fourth toe of left foot   ? Cholelithiasis   ? Coronary artery disease   ? Nonobstructive CAD (40-50% LAD) 08/2019  ? Depression   ? Depression   ? Phreesia 09/27/2020  ? Depression   ? Phreesia 11/01/2020  ? Diabetes mellitus without complication (HCC)   ? Phreesia 09/27/2020  ? Elevated troponin level not due myocardial infarction 11/11/2019  ? Essential hypertension   ? Gangrene of toe of left foot (HCC) 07/06/2019  ? Heart murmur   ? Phreesia 09/27/2020  ? Hyperlipidemia   ?  Phreesia 09/27/2020  ? Hypertension   ? Phreesia 09/27/2020  ? Mixed hyperlipidemia   ? Morbid obesity (HCC)   ? S/P aortic valve replacement with mechanical valve 12/05/2019  ? 25 mm Carbomedics top hat bileaflet mechanical valve via partial upper hemi-sternotomy  ? Severe aortic stenosis 09/24/2019  ? Type 2 diabetes mellitus (HCC)   ?  ?Family History  ?Problem Relation Age of Onset  ? Cancer Mother   ?     Brain  ? Heart disease Father   ? Hyperlipidemia Father   ? Hypertension Father   ? Stroke Father   ? Heart murmur Sister   ?  ?Past Surgical History:  ?Procedure  Laterality Date  ? ABDOMINAL AORTOGRAM W/LOWER EXTREMITY N/A 07/06/2019  ? Procedure: ABDOMINAL AORTOGRAM W/LOWER EXTREMITY;  Surgeon: Sherren Kerns, MD;  Location: [facility] INVASIVE CV LAB;  Service: Cardiovascular;  Laterality: N/A;  Bilateral  ? AMPUTATION Left 07/09/2019  ? Procedure: LEFT FOURTH and Fifth TOE AMPUTATION.;  Surgeon: Larina Earthly, MD;  Location: [facility] OR;  Service: Vascular;  Laterality: Left;  ? AMPUTATION Left 03/11/2021  ? Procedure: LEFT FOOT 5TH  AND 4TH RAY AMPUTATION;  Surgeon: Nadara Mustard, MD;  Location: [facility] OR;  Service: Orthopedics;  Laterality: Left;  ? AMPUTATION Left 08/28/2021  ? Procedure: AMPUTATION BELOW KNEE;  Surgeon: Nadara Mustard, MD;  Location: [facility] OR;  Service: Orthopedics;  Laterality: Left;  ? AORTIC VALVE REPLACEMENT N/A 12/05/2019  ? Procedure: PARTIAL STERNOTOMY FOR AORTIC VALVE REPLACEMENT (AVR), USING CARBOMEDICS SUPRA-ANNULAR TOP HAT ;  Surgeon: Purcell Nails, MD;  Location: [facility] OR;  Service: Open Heart Surgery;  Laterality: N/A;  No neck lines on left  ? APPLICATION OF WOUND VAC Left 10/30/2021  ? Procedure: APPLICATION OF WOUND VAC;  Surgeon: Nadara Mustard, MD;  Location: [facility] OR;  Service: Orthopedics;  Laterality: Left;  ? CARDIAC VALVE REPLACEMENT N/A   ? Phreesia 09/27/2020  ? IR RADIOLOGY PERIPHERAL GUIDED IV START  10/05/2019  ? IR US GUIDE VASC ACCESS RIGHT  10/05/2019  ? MULTIPLE EXTRACTIONS WITH ALVEOLOPLASTY N/A 10/26/2019  ? Procedure: EXTRACTION OF TOOTH #'S 3, 5-11,19-28,  AND 32 WITH ALVEOLOPLASTY;  Surgeon: Charlynne Pander, DDS;  Location: [facility] OR;  Service: Oral Surgery;  Laterality: N/A;  ? RIGHT HEART CATH AND CORONARY ANGIOGRAPHY N/A 09/24/2019  ? Procedure: RIGHT HEART CATH AND CORONARY ANGIOGRAPHY;  Surgeon: Tonny Bollman, MD;  Location: [facility] INVASIVE CV LAB;  Service: Cardiovascular;  Laterality: N/A;  ? STUMP REVISION Left 10/30/2021  ? Procedure: REVISION LEFT BELOW KNEE AMPUTATION;  Surgeon: Nadara Mustard, MD;  Location: [facility] OR;  Service:  Orthopedics;  Laterality: Left;  ? TEE WITHOUT CARDIOVERSION N/A 12/05/2019  ? Procedure: TRANSESOPHAGEAL ECHOCARDIOGRAM (TEE);  Surgeon: Purcell Nails, MD;  Location: [facility] OR;  Service: Open Heart Surgery;  Laterality: N/A;  ? TEE WITHOUT CARDIOVERSION N/A 09/01/2021  ? Procedure: TRANSESOPHAGEAL ECHOCARDIOGRAM (TEE);  Surgeon: Little Ishikawa, MD;  Location: [facility] ENDOSCOPY;  Service: Cardiovascular;  Laterality: N/A;  ? ?Social History  ? ?Occupational History  ?  Comment: Glass blower/designer- self-employed  ?Tobacco Use  ? Smoking status: Never  ? Smokeless tobacco: Former  ?  Types: Chew  ?  Quit date: 2021  ?Vaping Use  ? Vaping Use: Never used  ?Substance and Sexual Activity  ? Alcohol use: Yes  ?  Comment: occasionally  ? Drug use: No  ? Sexual activity: Yes  ?  Birth control/protection: None  ? ? ?

## 2021-12-07 ENCOUNTER — Other Ambulatory Visit: Payer: Self-pay

## 2021-12-07 ENCOUNTER — Encounter: Payer: Self-pay | Admitting: Physical Medicine and Rehabilitation

## 2021-12-07 ENCOUNTER — Other Ambulatory Visit: Payer: Self-pay | Admitting: Physical Medicine and Rehabilitation

## 2021-12-07 ENCOUNTER — Encounter: Payer: 59 | Attending: Registered Nurse | Admitting: Physical Medicine and Rehabilitation

## 2021-12-07 VITALS — BP 138/81 | HR 95 | Temp 98.3°F | Ht 70.0 in

## 2021-12-07 DIAGNOSIS — I10 Essential (primary) hypertension: Secondary | ICD-10-CM

## 2021-12-07 DIAGNOSIS — T8781 Dehiscence of amputation stump: Secondary | ICD-10-CM | POA: Insufficient documentation

## 2021-12-07 DIAGNOSIS — F419 Anxiety disorder, unspecified: Secondary | ICD-10-CM | POA: Insufficient documentation

## 2021-12-07 DIAGNOSIS — Z89512 Acquired absence of left leg below knee: Secondary | ICD-10-CM | POA: Diagnosis not present

## 2021-12-07 MED ORDER — GABAPENTIN 300 MG PO CAPS: 600.0000 mg | ORAL_CAPSULE | Freq: Two times a day (BID) | ORAL | 5 refills | Status: DC

## 2021-12-07 MED ORDER — SERTRALINE HCL 50 MG PO TABS: 50.0000 mg | ORAL_TABLET | Freq: Every day | ORAL | 5 refills | Status: DC

## 2021-12-07 NOTE — Patient Instructions (Signed)
Pt is a 46 yr old male with L BKA with continued swelling/redness/and ulceration- dehiscence- was revised on 10/30/21. By Dr Lajoyce Corners.  ? ?Here for f/u on L BKA.  ? ? ?Next coumadin needs to get from Coumadin clinic. I cannot fill again.  ? ?2. Still taking Zoloft- will refill for pt. 0 days supply- 5 refills.  ? ?3. Happy to continue his Gabapentin 600 mg BID- #120- 5 refills.  ? ?4. Need to get Lisinopril and Protonix from PCP in future.  ? ? ?5. Continue to work on straightening L knee and L hip- easiest way is to lay on your stomach for 10 minutes 2x/day. To prevent contractures is essential to using prosthesis.  ? ?6. F/U 3 months- If you want me to write for outpt PT_ call me and let me know.  ? ?

## 2021-12-07 NOTE — Progress Notes (Signed)
Subjective:    Patient ID: Adam Long, male    DOB: 1975-10-25, 46 y.o.   MRN: [id number]  HPI Pt is a 46 yr old male with L BKA with continued swelling/redness/and ulceration- dehiscence- was revised on 10/30/21. By Dr Lajoyce Corners.   Here for f/u on L BKA.   S/P CIR finished in mid December 2022.   Things going "all right".  Still healing- per Barnie Del' note, has 2 spots still open, but no drainage.   Using RW around the house- and w/c mainly in kitchen.  Can mop/sweep and do dishes and laundry from w/c level.   Has gotten over "feeling sorry for self".   Last Rx was 10/31/21 for Percocet- #30- has a few left of note.  Taking Tylenol most of the time now. But sometimes will hurt worse at night.   Done with PT until gets leg.  Uses shrinker all the time.   In support group- upsetting people going faster than him.    Pain Inventory Average Pain 3 Pain Right Now 0 My pain is intermittent and dull  In the last 24 hours, has pain interfered with the following? General activity 3 Relation with others 2 Enjoyment of life 2 What TIME of day is your pain at its worst? night Sleep (in general) Fair  Pain is worse with: unsure Pain improves with: medication Relief from Meds: 9  Family History  Problem Relation Age of Onset   Cancer Mother        Brain   Heart disease Father    Hyperlipidemia Father    Hypertension Father    Stroke Father    Heart murmur Sister    Social History   Socioeconomic History   Marital status: Single    Spouse name: Not on file   Number of children: Not on file   Years of education: Not on file   Highest education level: Not on file  Occupational History    Comment: Glass blower/designer- self-employed  Tobacco Use   Smoking status: Never   Smokeless tobacco: Former    Types: Chew    Quit date: 2021  Vaping Use   Vaping Use: Never used  Substance and Sexual Activity   Alcohol use: Yes    Comment: occasionally   Drug use: No    Sexual activity: Yes    Birth control/protection: None  Other Topics Concern   Not on file  Social History Narrative   Not on file   Social Determinants of Health   Financial Resource Strain: Not on file  Food Insecurity: Not on file  Transportation Needs: Not on file  Physical Activity: Not on file  Stress: Not on file  Social Connections: Not on file   Past Surgical History:  Procedure Laterality Date   ABDOMINAL AORTOGRAM W/LOWER EXTREMITY N/A 07/06/2019   Procedure: ABDOMINAL AORTOGRAM W/LOWER EXTREMITY;  Surgeon: Sherren Kerns, MD;  [facility];  Service: Cardiovascular;  Laterality: N/A;  Bilateral   AMPUTATION Left 07/09/2019   Procedure: LEFT FOURTH and Fifth TOE AMPUTATION.;  Surgeon: Larina Earthly, MD;  Location: [facility] OR;  Service: Vascular;  Laterality: Left;   AMPUTATION Left 03/11/2021   Procedure: LEFT FOOT 5TH  AND 4TH RAY AMPUTATION;  Surgeon: Nadara Mustard, MD;  Location: [facility] OR;  Service: Orthopedics;  Laterality: Left;   AMPUTATION Left 08/28/2021   Procedure: AMPUTATION BELOW KNEE;  Surgeon: Nadara Mustard, MD;  [facility];  Service:  Orthopedics;  Laterality: Left;   AORTIC VALVE REPLACEMENT N/A 12/05/2019   Procedure: PARTIAL STERNOTOMY FOR AORTIC VALVE REPLACEMENT (AVR), USING CARBOMEDICS SUPRA-ANNULAR TOP HAT 25MM;  Surgeon: Purcell Nailswen, Clarence H, MD;  [facility];  Service: Open Heart Surgery;  Laterality: N/A;  No neck lines on left   APPLICATION OF WOUND VAC Left 10/30/2021   Procedure: APPLICATION OF WOUND VAC;  Surgeon: Nadara Mustarduda, Marcus V, MD;  [facility]: [facility] OR;  Service: Orthopedics;  Laterality: Left;   CARDIAC VALVE REPLACEMENT N/A    Phreesia 09/27/2020   IR RADIOLOGY PERIPHERAL GUIDED IV START  10/05/2019   IR US GUIDE VASC ACCESS RIGHT  10/05/2019   MULTIPLE EXTRACTIONS WITH ALVEOLOPLASTY N/A 10/26/2019   Procedure: EXTRACTION OF TOOTH #'S 3, 5-11,19-28,  AND 32 WITH ALVEOLOPLASTY;  Surgeon: Charlynne PanderKulinski, Ronald F, DDS;  [facility]: [facility];   Service: Oral Surgery;  Laterality: N/A;   RIGHT HEART CATH AND CORONARY ANGIOGRAPHY N/A 09/24/2019   Procedure: RIGHT HEART CATH AND CORONARY ANGIOGRAPHY;  Surgeon: Tonny Bollmanooper, Michael, MD;  [facility];  Service: Cardiovascular;  Laterality: N/A;   STUMP REVISION Left 10/30/2021   Procedure: REVISION LEFT BELOW KNEE AMPUTATION;  Surgeon: Nadara Mustarduda, Marcus V, MD;  [facility];  Service: Orthopedics;  Laterality: Left;   TEE WITHOUT CARDIOVERSION N/A 12/05/2019   Procedure: TRANSESOPHAGEAL ECHOCARDIOGRAM (TEE);  Surgeon: Purcell Nailswen, Clarence H, MD;  [facility];  Service: Open Heart Surgery;  Laterality: N/A;   TEE WITHOUT CARDIOVERSION N/A 09/01/2021   Procedure: TRANSESOPHAGEAL ECHOCARDIOGRAM (TEE);  Surgeon: Little IshikawaSchumann, Christopher L, MD;  [facility];  Service: Cardiovascular;  Laterality: N/A;   Past Surgical History:  Procedure Laterality Date   ABDOMINAL AORTOGRAM W/LOWER EXTREMITY N/A 07/06/2019   Procedure: ABDOMINAL AORTOGRAM W/LOWER EXTREMITY;  Surgeon: Sherren KernsFields, Charles E, MD;  [facility];  Service: Cardiovascular;  Laterality: N/A;  Bilateral   AMPUTATION Left 07/09/2019   Procedure: LEFT FOURTH and Fifth TOE AMPUTATION.;  Surgeon: Larina EarthlyEarly, Todd F, MD;  [facility];  Service: Vascular;  Laterality: Left;   AMPUTATION Left 03/11/2021   Procedure: LEFT FOOT 5TH  AND 4TH RAY AMPUTATION;  Surgeon: Nadara Mustarduda, Marcus V, MD;  [facility];  Service: Orthopedics;  Laterality: Left;   AMPUTATION Left 08/28/2021   Procedure: AMPUTATION BELOW KNEE;  Surgeon: Nadara Mustarduda, Marcus V, MD;  [facility];  Service: Orthopedics;  Laterality: Left;   AORTIC VALVE REPLACEMENT N/A 12/05/2019   Procedure: PARTIAL STERNOTOMY FOR AORTIC VALVE REPLACEMENT (AVR), USING CARBOMEDICS SUPRA-ANNULAR TOP HAT 25MM;  Surgeon: Purcell Nailswen, Clarence H, MD;  [facility];  Service: Open Heart Surgery;  Laterality: N/A;  No neck lines on left   APPLICATION OF WOUND VAC Left 10/30/2021   Procedure:  APPLICATION OF WOUND VAC;  Surgeon: Nadara Mustarduda, Marcus V, MD;  [facility];  Service: Orthopedics;  Laterality: Left;   CARDIAC VALVE REPLACEMENT N/A    Phreesia 09/27/2020   IR RADIOLOGY PERIPHERAL GUIDED IV START  10/05/2019   IR US GUIDE VASC ACCESS RIGHT  10/05/2019   MULTIPLE EXTRACTIONS WITH ALVEOLOPLASTY N/A 10/26/2019   Procedure: EXTRACTION OF TOOTH #'S 3, 5-11,19-28,  AND 32 WITH ALVEOLOPLASTY;  Surgeon: Charlynne PanderKulinski, Ronald F, DDS;  [facility];  Service: Oral Surgery;  Laterality: N/A;   RIGHT HEART CATH AND CORONARY ANGIOGRAPHY N/A 09/24/2019   Procedure: RIGHT HEART CATH AND CORONARY ANGIOGRAPHY;  Surgeon: Tonny Bollmanooper, Michael, MD;  [facility];  Service: Cardiovascular;  Laterality: N/A;   STUMP  REVISION Left 10/30/2021   Procedure: REVISION LEFT BELOW KNEE AMPUTATION;  Surgeon: Nadara Mustard, MD;  Location: [facility] OR;  Service: Orthopedics;  Laterality: Left;   TEE WITHOUT CARDIOVERSION N/A 12/05/2019   Procedure: TRANSESOPHAGEAL ECHOCARDIOGRAM (TEE);  Surgeon: Purcell Nails, MD;  Location: [facility] OR;  Service: Open Heart Surgery;  Laterality: N/A;   TEE WITHOUT CARDIOVERSION N/A 09/01/2021   Procedure: TRANSESOPHAGEAL ECHOCARDIOGRAM (TEE);  Surgeon: Little Ishikawa, MD;  Location: [facility] ENDOSCOPY;  Service: Cardiovascular;  Laterality: N/A;   Past Medical History:  Diagnosis Date   Anxiety    Aortic stenosis    Cellulitis and abscess of lower extremity 06/11/2019   Cellulitis of fourth toe of left foot    Cholelithiasis    Coronary artery disease    Nonobstructive CAD (40-50% LAD) 08/2019   Depression    Depression    Phreesia 09/27/2020   Depression    Phreesia 11/01/2020   Diabetes mellitus without complication (HCC)    Phreesia 09/27/2020   Elevated troponin level not due myocardial infarction 11/11/2019   Essential hypertension    Gangrene of toe of left foot (HCC) 07/06/2019   Heart murmur    Phreesia 09/27/2020   Hyperlipidemia    Phreesia 09/27/2020    Hypertension    Phreesia 09/27/2020   Mixed hyperlipidemia    Morbid obesity (HCC)    S/P aortic valve replacement with mechanical valve 12/05/2019   25 mm Carbomedics top hat bileaflet mechanical valve via partial upper hemi-sternotomy   Severe aortic stenosis 09/24/2019   Type 2 diabetes mellitus (HCC)    BP 138/81    Pulse 95    Temp 98.3 F (36.8 C)    Ht 5\' 10"  (1.778 m)    SpO2 98%    BMI 50.22 kg/m   Opioid Risk Score:   Fall Risk Score:  `1  Depression screen PHQ 2/9  Depression screen [facility] 2/9 12/07/2021 10/19/2021 10/14/2021 06/04/2021 03/02/2021 02/02/2021 11/04/2020  Decreased Interest 0 1 1 0 0 0 0  Down, Depressed, Hopeless 1 1 1  0 0 0 0  PHQ - 2 Score 1 2 2  0 0 0 0  Altered sleeping - 0 2 - - - 0  Tired, decreased energy - 0 1 - - - 0  Change in appetite - 0 0 - - - 0  Feeling bad or failure about yourself  - 1 1 - - - 0  Trouble concentrating - 1 1 - - - 1  Moving slowly or fidgety/restless - 0 0 - - - 0  Suicidal thoughts - 0 0 - - - 0  PHQ-9 Score - 4 7 - - - 1  Difficult doing work/chores - - Somewhat difficult - - - -  Some recent data might be hidden     Review of Systems  Constitutional: Negative.   HENT: Negative.    Eyes: Negative.   Respiratory: Negative.    Cardiovascular: Negative.   Gastrointestinal: Negative.   Endocrine: Negative.   Genitourinary: Negative.   Musculoskeletal:  Positive for gait problem.  Skin: Negative.   Allergic/Immunologic: Negative.   Hematological: Negative.   Psychiatric/Behavioral: Negative.        Objective:   Physical Exam  Awake, alert, appropriate, weight 350 lbs, NAD In transport w/c today       Assessment & Plan:   Pt is a 46 yr old male with L BKA with continued swelling/redness/and ulceration- dehiscence- was revised on 10/30/21. By Dr .  Here for f/u on L BKA.    Next coumadin needs to get from Coumadin clinic. I cannot fill again.   2. Still taking Zoloft- will refill for pt. 0 days supply- 5  refills.   3. Happy to continue his Gabapentin 600 mg BID- #120- 5 refills.   4. Need to get Lisinopril and Protonix from PCP in future.    5. Continue to work on straightening L knee and L hip- easiest way is to lay on your stomach for 10 minutes 2x/day. To prevent contractures is essential to using prosthesis.   6. F/U 3 months- If you want me to write for outpt PT_ call me and let me know.   I spent a total of 22   minutes on total care today- >50% coordination of care- due to  education on contracture prevention and meds.

## 2021-12-09 ENCOUNTER — Ambulatory Visit (INDEPENDENT_AMBULATORY_CARE_PROVIDER_SITE_OTHER): Payer: 59 | Admitting: *Deleted

## 2021-12-09 DIAGNOSIS — Z5181 Encounter for therapeutic drug level monitoring: Secondary | ICD-10-CM

## 2021-12-09 DIAGNOSIS — Z952 Presence of prosthetic heart valve: Secondary | ICD-10-CM | POA: Diagnosis not present

## 2021-12-09 LAB — POCT INR: INR: 3.2 — AB (ref 2.0–3.0)

## 2021-12-09 NOTE — Patient Instructions (Signed)
Decrease warfarin to 1 tablet daily except 1/2 tablet on Sundays and Wednesdays ?Recheck in 2 wk ?

## 2021-12-10 ENCOUNTER — Other Ambulatory Visit: Payer: Self-pay | Admitting: Physical Medicine and Rehabilitation

## 2021-12-11 ENCOUNTER — Other Ambulatory Visit: Payer: Self-pay | Admitting: Physical Medicine and Rehabilitation

## 2021-12-11 DIAGNOSIS — I10 Essential (primary) hypertension: Secondary | ICD-10-CM

## 2021-12-15 ENCOUNTER — Other Ambulatory Visit: Payer: Self-pay | Admitting: Family Medicine

## 2021-12-15 DIAGNOSIS — I10 Essential (primary) hypertension: Secondary | ICD-10-CM

## 2021-12-16 ENCOUNTER — Telehealth: Payer: Self-pay | Admitting: Family

## 2021-12-16 ENCOUNTER — Ambulatory Visit (INDEPENDENT_AMBULATORY_CARE_PROVIDER_SITE_OTHER): Payer: 59 | Admitting: Family

## 2021-12-16 ENCOUNTER — Other Ambulatory Visit: Payer: Self-pay

## 2021-12-16 DIAGNOSIS — S88112A Complete traumatic amputation at level between knee and ankle, left lower leg, initial encounter: Secondary | ICD-10-CM

## 2021-12-16 DIAGNOSIS — Z89512 Acquired absence of left leg below knee: Secondary | ICD-10-CM

## 2021-12-16 MED ORDER — CEPHALEXIN 500 MG PO CAPS: 500.0000 mg | ORAL_CAPSULE | Freq: Three times a day (TID) | ORAL | 0 refills | Status: DC

## 2021-12-16 MED ORDER — SULFAMETHOXAZOLE-TRIMETHOPRIM 800-160 MG PO TABS: 1.0000 | ORAL_TABLET | Freq: Two times a day (BID) | ORAL | 0 refills | Status: DC

## 2021-12-16 NOTE — Progress Notes (Signed)
? ?Post-Op Visit Note ?  ?Patient: Adam Long           ?Date of Birth: 1976/07/06           ?MRN: [id number] ?Visit Date: 12/16/2021 ?PCP: Heather Roberts, NP (Inactive) ? ?Chief Complaint:  ?Chief Complaint  ?Patient presents with  ? Left Leg - Routine Post Op  ?  10/30/21 left BKA revision ?Photos taken  ? ? ?HPI:  ?HPI ?The patient is a 46 year old gentleman seen status post revision of the left below-knee amputation February 3.  He continues to have issues with swelling and drainage.  There are 2 areas that are significantly open.  He has been using his shrinker daily ? ?  ? ?Ortho Exam ?On examination of the left residual limb he does have 2 sutures in place centrally.  Laterally and medially there are 2 areas that have dehisced these are about 3 cm in diameter and probe about 3 cm deep.  Does not probe to bone however there is necrotic tissue in the wound bed there is scant granulation.  There is scant serous drainage.  There is surrounding mild erythema mild warmth. ? ?Visit Diagnoses: No diagnosis found. ? ?Plan: concern for dehiscence with necrosis. Discussed further limb salvage surgery. Will proceed with conservative measures. Pack open with silver cell. Start on bactrim. Follow with [facility] Monday. Discussed possible AKA next week. ? ?Follow-Up Instructions: No follow-ups on file.  ? ?Imaging: ?No results found. ? ?Orders:  ?No orders of the defined types were placed in this encounter. ? ?Meds ordered this encounter  ?Medications  ? sulfamethoxazole-trimethoprim (BACTRIM DS) 800-160 MG tablet  ?  Sig: Take 1 tablet by mouth 2 (two) times daily.  ?  Dispense:  20 tablet  ?  Refill:  0  ? ? ? ? ?PMFS History: ?Patient Active Problem List  ? Diagnosis Date Noted  ? S/P BKA (below knee amputation), left (HCC) 10/30/2021  ? History of below knee amputation, left (HCC) 10/30/2021  ? Dehiscence of amputation stump of left lower extremity (HCC)   ? Below-knee amputation of left lower extremity (HCC) 09/02/2021  ?  Severe protein-calorie malnutrition (HCC)   ? MSSA bacteremia   ? Ulcer of left foot (HCC)   ? Subacute osteomyelitis, left ankle and foot (HCC)   ? Diabetic foot infection (HCC) 02/14/2021  ? Diabetic ulcer of left foot associated with type 1 diabetes mellitus, limited to breakdown of skin (HCC)   ? Leukocytosis 02/13/2021  ? Hyponatremia 02/13/2021  ? Normocytic anemia 02/13/2021  ? Hyperglycemia due to diabetes mellitus (HCC) 02/13/2021  ? Open wound of left foot 02/12/2021  ? Left leg swelling 02/02/2021  ? History of complete ray amputation of fifth toe of left foot (HCC) 11/04/2020  ? Immunization due 11/04/2020  ? Anxiety 09/30/2020  ? Preventative health care 12/13/2019  ? S/P aortic valve replacement with mechanical valve 12/05/2019  ? S/P AVR (aortic valve replacement) 12/05/2019  ? Syncope 11/10/2019  ? Personal history of noncompliance with medical treatment, presenting hazards to health 03/16/2018  ? Uncontrolled diabetes mellitus with hyperglycemia, without long-term current use of insulin (HCC) 10/12/2015  ? Hyperlipidemia associated with type 2 diabetes mellitus (HCC) 10/12/2015  ? Morbid obesity (HCC) 10/12/2015  ? Chest pain 07/02/2013  ? HTN (hypertension) 07/02/2013  ? ?Past Medical History:  ?Diagnosis Date  ? Anxiety   ? Aortic stenosis   ? Cellulitis and abscess of lower extremity 06/11/2019  ? Cellulitis of fourth toe  of left foot   ? Cholelithiasis   ? Coronary artery disease   ? Nonobstructive CAD (40-50% LAD) 08/2019  ? Depression   ? Depression   ? Phreesia 09/27/2020  ? Depression   ? Phreesia 11/01/2020  ? Diabetes mellitus without complication (HCC)   ? Phreesia 09/27/2020  ? Elevated troponin level not due myocardial infarction 11/11/2019  ? Essential hypertension   ? Gangrene of toe of left foot (HCC) 07/06/2019  ? Heart murmur   ? Phreesia 09/27/2020  ? Hyperlipidemia   ? Phreesia 09/27/2020  ? Hypertension   ? Phreesia 09/27/2020  ? Mixed hyperlipidemia   ? Morbid obesity (HCC)   ?  S/P aortic valve replacement with mechanical valve 12/05/2019  ? 25 mm Carbomedics top hat bileaflet mechanical valve via partial upper hemi-sternotomy  ? Severe aortic stenosis 09/24/2019  ? Type 2 diabetes mellitus (HCC)   ?  ?Family History  ?Problem Relation Age of Onset  ? Cancer Mother   ?     Brain  ? Heart disease Father   ? Hyperlipidemia Father   ? Hypertension Father   ? Stroke Father   ? Heart murmur Sister   ?  ?Past Surgical History:  ?Procedure Laterality Date  ? ABDOMINAL AORTOGRAM W/LOWER EXTREMITY N/A 07/06/2019  ? Procedure: ABDOMINAL AORTOGRAM W/LOWER EXTREMITY;  Surgeon: Sherren KernsFields, Charles E, MD;  [facility];  Service: Cardiovascular;  Laterality: N/A;  Bilateral  ? AMPUTATION Left 07/09/2019  ? Procedure: LEFT FOURTH and Fifth TOE AMPUTATION.;  Surgeon: Larina EarthlyEarly, Todd F, MD;  [facility];  Service: Vascular;  Laterality: Left;  ? AMPUTATION Left 03/11/2021  ? Procedure: LEFT FOOT 5TH  AND 4TH RAY AMPUTATION;  Surgeon: Nadara Mustarduda, Marcus V, MD;  [facility];  Service: Orthopedics;  Laterality: Left;  ? AMPUTATION Left 08/28/2021  ? Procedure: AMPUTATION BELOW KNEE;  Surgeon: Nadara Mustarduda, Marcus V, MD;  [facility];  Service: Orthopedics;  Laterality: Left;  ? AORTIC VALVE REPLACEMENT N/A 12/05/2019  ? Procedure: PARTIAL STERNOTOMY FOR AORTIC VALVE REPLACEMENT (AVR), USING CARBOMEDICS SUPRA-ANNULAR TOP HAT 25MM;  Surgeon: Purcell Nailswen, Clarence H, MD;  [facility];  Service: Open Heart Surgery;  Laterality: N/A;  No neck lines on left  ? APPLICATION OF WOUND VAC Left 10/30/2021  ? Procedure: APPLICATION OF WOUND VAC;  Surgeon: Nadara Mustarduda, Marcus V, MD;  [facility];  Service: Orthopedics;  Laterality: Left;  ? CARDIAC VALVE REPLACEMENT N/A   ? Phreesia 09/27/2020  ? IR RADIOLOGY PERIPHERAL GUIDED IV START  10/05/2019  ? IR US GUIDE VASC ACCESS RIGHT  10/05/2019  ? MULTIPLE EXTRACTIONS WITH ALVEOLOPLASTY N/A 10/26/2019  ? Procedure: EXTRACTION OF TOOTH #'S 3, 5-11,19-28,  AND 32 WITH ALVEOLOPLASTY;   Surgeon: Charlynne PanderKulinski, Ronald F, DDS;  [facility];  Service: Oral Surgery;  Laterality: N/A;  ? RIGHT HEART CATH AND CORONARY ANGIOGRAPHY N/A 09/24/2019  ? Procedure: RIGHT HEART CATH AND CORONARY ANGIOGRAPHY;  Surgeon: Tonny Bollmanooper, Michael, MD;  [facility];  Service: Cardiovascular;  Laterality: N/A;  ? STUMP REVISION Left 10/30/2021  ? Procedure: REVISION LEFT BELOW KNEE AMPUTATION;  Surgeon: Nadara Mustarduda, Marcus V, MD;  [facility];  Service: Orthopedics;  Laterality: Left;  ? TEE WITHOUT CARDIOVERSION N/A 12/05/2019  ? Procedure: TRANSESOPHAGEAL ECHOCARDIOGRAM (TEE);  Surgeon: Purcell Nailswen, Clarence H, MD;  [facility];  Service: Open Heart Surgery;  Laterality: N/A;  ? TEE WITHOUT CARDIOVERSION N/A 09/01/2021  ? Procedure: TRANSESOPHAGEAL ECHOCARDIOGRAM (TEE);  Surgeon: Epifanio LeschesSchumann, Christopher  L, MD;  Location: [facility] ENDOSCOPY;  Service: Cardiovascular;  Laterality: N/A;  ? ?Social History  ? ?Occupational History  ?  Comment: Glass blower/designer- self-employed  ?Tobacco Use  ? Smoking status: Never  ? Smokeless tobacco: Former  ?  Types: Chew  ?  Quit date: 2021  ?Vaping Use  ? Vaping Use: Never used  ?Substance and Sexual Activity  ? Alcohol use: Yes  ?  Comment: occasionally  ? Drug use: No  ? Sexual activity: Yes  ?  Birth control/protection: None  ? ? ?

## 2021-12-16 NOTE — Telephone Encounter (Signed)
[facility] called and stated the medication that was sent in is interacting with his blood thinners. She needs someone to call her.  ? ?CB [phone] ?

## 2021-12-16 NOTE — Addendum Note (Signed)
Addended by: Barnie Del R on: 12/16/2021 11:45 AM ? ? Modules accepted: Orders ? ?

## 2021-12-21 ENCOUNTER — Ambulatory Visit (INDEPENDENT_AMBULATORY_CARE_PROVIDER_SITE_OTHER): Payer: 59 | Admitting: Orthopedic Surgery

## 2021-12-21 DIAGNOSIS — Z89512 Acquired absence of left leg below knee: Secondary | ICD-10-CM

## 2021-12-21 DIAGNOSIS — T8781 Dehiscence of amputation stump: Secondary | ICD-10-CM

## 2021-12-21 DIAGNOSIS — S88112A Complete traumatic amputation at level between knee and ankle, left lower leg, initial encounter: Secondary | ICD-10-CM

## 2021-12-22 ENCOUNTER — Encounter: Payer: Self-pay | Admitting: Orthopedic Surgery

## 2021-12-22 NOTE — Progress Notes (Signed)
? ?Office Visit Note ?  ?Patient: Adam Long           ?Date of Birth: [birth year]           ?MRN: [id number] ?Visit Date: 12/21/2021 ?             ?Requested by: No referring provider defined for this encounter. ?PCP: Noreene Larsson, NP (Inactive) ? ?Chief Complaint  ?Patient presents with  ? Left Leg - Routine Post Op  ?  10/30/21 Left BKA  ? ? ? ? ?HPI: ?Patient is a 46 year old gentleman who presents status post revision left transtibial amputation.  Patient states he has progressive ulceration. ? ?Assessment & Plan: ?Visit Diagnoses:  ?1. Below-knee amputation of left lower extremity ([place])   ?2. Dehiscence of amputation stump (HCC)   ? ? ?Plan: With the progressive ulceration and wound dehiscence ? ?Discussed that patient's only option is to proceed with a revision transtibial amputation or above-the-knee amputation.  Patient states he would like to proceed with revision of the below-knee amputation he does not feel he will be able to ambulate with a above-the-knee prosthesis. ? ?Follow-Up Instructions: Return in about 2 weeks (around 01/04/2022).  ? ?Ortho Exam ? ?Patient is alert, oriented, no adenopathy, well-dressed, normal affect, normal respiratory effort. ?Examination patient has 3 open wounds over the transtibial amputation there is no swelling no cellulitis there is clear drainage.  The 3 ulcers probe to bone. ? ?Imaging: ?No results found. ? ? ? ?Labs: ?Lab Results  ?Component Value Date  ? HGBA1C 6.4 (H) 10/30/2021  ? HGBA1C 7.8 (H) 08/28/2021  ? HGBA1C 6.8 (H) 06/04/2021  ? ESRSEDRATE 60 (H) 09/14/2021  ? ESRSEDRATE 65 (H) 09/07/2021  ? ESRSEDRATE 104 (H) 08/29/2021  ? CRP 1.5 (H) 09/14/2021  ? CRP 3.4 (H) 09/07/2021  ? CRP 12.8 (H) 08/29/2021  ? REPTSTATUS 11/05/2021 FINAL 10/30/2021  ? GRAMSTAIN  10/30/2021  ?  FEW WBC PRESENT, PREDOMINANTLY MONONUCLEAR ?RARE GRAM NEGATIVE RODS ?  ? CULT  10/30/2021  ?  FEW ESCHERICHIA COLI ?RARE PROTEUS MIRABILIS ?RARE ENTEROCOCCUS FAECALIS ?NO ANAEROBES  ISOLATED ?Performed at [facility], [place] [address], [place], [place] [address] ?  ? [place] 10/30/2021  ? [place] 10/30/2021  ? LABORGA ENTEROCOCCUS FAECALIS 10/30/2021  ? ? ? ?Lab Results  ?Component Value Date  ? ALBUMIN 3.6 10/29/2021  ? ALBUMIN 2.6 (L) 09/14/2021  ? ALBUMIN 2.3 (L) 09/03/2021  ? PREALBUMIN 21.6 10/29/2021  ? ? ?Lab Results  ?Component Value Date  ? MG 1.5 (L) 10/29/2021  ? MG 1.8 02/13/2021  ? MG 2.1 12/06/2019  ? ?Lab Results  ?Component Value Date  ? VD25OH 38 06/13/2020  ? ? ?Lab Results  ?Component Value Date  ? PREALBUMIN 21.6 10/29/2021  ? ? ?  Latest Ref Rng & Units 10/31/2021  ?  5:50 AM 10/29/2021  ?  1:51 PM 09/14/2021  ?  3:48 AM  ?CBC EXTENDED  ?WBC 4.0 - 10.5 K/uL 9.7   10.8   7.2    ?RBC 4.22 - 5.81 MIL/uL 3.82   4.89   3.83    ?Hemoglobin 13.0 - 17.0 g/dL 10.2   12.6   10.4    ?HCT 39.0 - 52.0 % 31.3   40.9   32.5    ?Platelets 150 - 400 K/uL 223   285   297    ?NEUT# 1.7 - 7.7 K/uL  7.5   4.2    ?Lymph# 0.7 -  4.0 K/uL  2.4   2.2    ? ? ? ?There is no height or weight on file to calculate BMI. ? ?Orders:  ?No orders of the defined types were placed in this encounter. ? ?No orders of the defined types were placed in this encounter. ? ? ? Procedures: ?No procedures performed ? ?Clinical Data: ?No additional findings. ? ?ROS: ? ?All other systems negative, except as noted in the HPI. ?Review of Systems ? ?Objective: ?Vital Signs: There were no vitals taken for this visit. ? ?Specialty Comments:  ?No specialty comments available. ? ?PMFS History: ?Patient Active Problem List  ? Diagnosis Date Noted  ? S/P BKA (below knee amputation), left ([place]) 10/30/2021  ? History of below knee amputation, left ([place]) 10/30/2021  ? Dehiscence of amputation stump of left lower extremity (HCC)   ? Below-knee amputation of left lower extremity ([place]) 09/02/2021  ? Severe protein-calorie malnutrition ([place])   ? MSSA bacteremia   ? Ulcer of left foot ([place])   ? Subacute  osteomyelitis, left ankle and foot ([place])   ? Diabetic foot infection ([place]) 02/14/2021  ? Diabetic ulcer of left foot associated with type 1 diabetes mellitus, limited to breakdown of skin ([place])   ? Leukocytosis 02/13/2021  ? Hyponatremia 02/13/2021  ? Normocytic anemia 02/13/2021  ? Hyperglycemia due to diabetes mellitus ([place]) 02/13/2021  ? Open wound of left foot 02/12/2021  ? Left leg swelling 02/02/2021  ? History of complete ray amputation of fifth toe of left foot ([place]) 11/04/2020  ? Immunization due 11/04/2020  ? Anxiety 09/30/2020  ? Preventative health care 12/13/2019  ? S/P aortic valve replacement with mechanical valve 12/05/2019  ? S/P AVR (aortic valve replacement) 12/05/2019  ? Syncope 11/10/2019  ? Personal history of noncompliance with medical treatment, presenting hazards to health 03/16/2018  ? Uncontrolled diabetes mellitus with hyperglycemia, without long-term current use of insulin ([place]) 10/12/2015  ? Hyperlipidemia associated with type 2 diabetes mellitus ([place]) 10/12/2015  ? Morbid obesity ([place]) 10/12/2015  ? Chest pain 07/02/2013  ? HTN (hypertension) 07/02/2013  ? ?Past Medical History:  ?Diagnosis Date  ? Anxiety   ? Aortic stenosis   ? Cellulitis and abscess of lower extremity 06/11/2019  ? Cellulitis of fourth toe of left foot   ? Cholelithiasis   ? Coronary artery disease   ? Nonobstructive CAD (40-50% LAD) 08/2019  ? Depression   ? Depression   ? Phreesia 09/27/2020  ? Depression   ? Phreesia 11/01/2020  ? Diabetes mellitus without complication ([place])   ? Phreesia 09/27/2020  ? Elevated troponin level not due myocardial infarction 11/11/2019  ? Essential hypertension   ? Gangrene of toe of left foot ([place]) 07/06/2019  ? Heart murmur   ? Phreesia 09/27/2020  ? Hyperlipidemia   ? Phreesia 09/27/2020  ? Hypertension   ? Phreesia 09/27/2020  ? Mixed hyperlipidemia   ? Morbid obesity ([place])   ? S/P aortic valve replacement with mechanical valve 12/05/2019  ? 25 mm Carbomedics top hat bileaflet  mechanical valve via partial upper hemi-sternotomy  ? Severe aortic stenosis 09/24/2019  ? Type 2 diabetes mellitus ([place])   ?  ?Family History  ?Problem Relation Age of Onset  ? Cancer Mother   ?     Brain  ? Heart disease Father   ? Hyperlipidemia Father   ? Hypertension Father   ? Stroke Father   ? Heart murmur Sister   ?  ?Past Surgical History:  ?Procedure Laterality Date  ?  ABDOMINAL AORTOGRAM W/LOWER EXTREMITY N/A 07/06/2019  ? Procedure: ABDOMINAL AORTOGRAM W/LOWER EXTREMITY;  Surgeon: Elam Dutch, MD;  Location: [facility];  Service: Cardiovascular;  Laterality: N/A;  Bilateral  ? AMPUTATION Left 07/09/2019  ? Procedure: LEFT FOURTH and Fifth TOE AMPUTATION.;  Surgeon: Rosetta Posner, MD;  Location: [facility];  Service: Vascular;  Laterality: Left;  ? AMPUTATION Left 03/11/2021  ? Procedure: LEFT FOOT 5TH  AND 4TH RAY AMPUTATION;  Surgeon: Newt Minion, MD;  Location: [facility];  Service: Orthopedics;  Laterality: Left;  ? AMPUTATION Left 08/28/2021  ? Procedure: AMPUTATION BELOW KNEE;  Surgeon: Newt Minion, MD;  Location: [facility];  Service: Orthopedics;  Laterality: Left;  ? AORTIC VALVE REPLACEMENT N/A 12/05/2019  ? Procedure: PARTIAL STERNOTOMY FOR AORTIC VALVE REPLACEMENT (AVR), USING CARBOMEDICS SUPRA-ANNULAR TOP HAT 25MM;  Surgeon: Rexene Alberts, MD;  Location: [facility];  Service: Open Heart Surgery;  Laterality: N/A;  No neck lines on left  ? APPLICATION OF WOUND VAC Left 10/30/2021  ? Procedure: APPLICATION OF WOUND VAC;  Surgeon: Newt Minion, MD;  Location: [facility];  Service: Orthopedics;  Laterality: Left;  ? CARDIAC VALVE REPLACEMENT N/A   ? Phreesia 09/27/2020  ? IR RADIOLOGY PERIPHERAL GUIDED IV START  10/05/2019  ? IR US GUIDE VASC ACCESS RIGHT  10/05/2019  ? MULTIPLE EXTRACTIONS WITH ALVEOLOPLASTY N/A 10/26/2019  ? Procedure: EXTRACTION OF TOOTH #'S 3, 5-11,19-28,  AND 32 WITH ALVEOLOPLASTY;  Surgeon: Lenn Cal, DDS;  Location: [facility];  Service: Oral Surgery;  Laterality: N/A;  ? RIGHT  HEART CATH AND CORONARY ANGIOGRAPHY N/A 09/24/2019  ? Procedure: RIGHT HEART CATH AND CORONARY ANGIOGRAPHY;  Surgeon: Sherren Mocha, MD;  Location: [facility];  Service: Cardiovascular;  Laterality: N/

## 2021-12-23 ENCOUNTER — Ambulatory Visit (INDEPENDENT_AMBULATORY_CARE_PROVIDER_SITE_OTHER): Payer: 59 | Admitting: *Deleted

## 2021-12-23 DIAGNOSIS — Z5181 Encounter for therapeutic drug level monitoring: Secondary | ICD-10-CM

## 2021-12-23 DIAGNOSIS — Z952 Presence of prosthetic heart valve: Secondary | ICD-10-CM | POA: Diagnosis not present

## 2021-12-23 LAB — POCT INR: INR: 5 — AB (ref 2.0–3.0)

## 2021-12-23 NOTE — Patient Instructions (Signed)
Hold warfarin tonight and tomorrow night then decrease dose to 1 tablet daily except 1/2 tablet on Mondays, Wednesdays and Fridays ?On Bactrim DS and Keflex till 3/31 ?Pending Lt BKA revision on 3/31.  Was not told to hold warfarin by surgeon ?

## 2021-12-24 ENCOUNTER — Encounter (HOSPITAL_COMMUNITY): Payer: Self-pay | Admitting: Orthopedic Surgery

## 2021-12-24 ENCOUNTER — Other Ambulatory Visit: Payer: Self-pay

## 2021-12-24 MED ORDER — TRANEXAMIC ACID 1000 MG/10ML IV SOLN
2000.0000 mg | INTRAVENOUS | Status: DC
  Filled 2021-12-24: qty 20

## 2021-12-24 NOTE — Anesthesia Preprocedure Evaluation (Addendum)
Anesthesia Evaluation  ?Patient identified by MRN, date of birth, ID band ?Patient awake ? ? ? ?Reviewed: ?Allergy & Precautions, NPO status , Patient's Chart, lab work & pertinent test results ? ?History of Anesthesia Complications ?Negative for: history of anesthetic complications ? ?Airway ?Mallampati: III ? ?TM Distance: >3 FB ?Neck ROM: Full ? ? ? Dental ? ?(+) Edentulous Upper, Edentulous Lower ?  ?Pulmonary ?neg pulmonary ROS,  ?  ?Pulmonary exam normal ? ? ? ? ? ? ? Cardiovascular ?hypertension, Pt. on home beta blockers and Pt. on medications ?+ CAD  ?Normal cardiovascular exam ? ?Severe AS s/p AVR ? ?TEE 08/2021: EF 60-65%, RV mildly enlarged  ?  ?Neuro/Psych ?Anxiety Depression negative neurological ROS ?   ? GI/Hepatic ?negative GI ROS, Neg liver ROS,   ?Endo/Other  ?diabetes, Type obesity ? Renal/GU ?negative Renal ROS  ?negative genitourinary ?  ?Musculoskeletal ?negative musculoskeletal ROS ?(+)  ? Abdominal ?  ?Peds ? Hematology ?negative hematology ROS ?(+)   ?Anesthesia Other Findings ?Day of surgery medications reviewed with patient. ? Reproductive/Obstetrics ?negative OB ROS ? ?  ? ? ? ? ? ? ? ? ? ? ? ? ? ?  ?  ? ? ? ? ? ? ?Anesthesia Physical ?Anesthesia Plan ? ?ASA: 3 ? ?Anesthesia Plan: General  ? ?Post-op Pain Management: Tylenol PO (pre-op)* and Regional block*  ? ?Induction: Intravenous ? ?PONV Risk Score and Plan: 2 and Treatment may vary due to age or medical condition, Ondansetron, Dexamethasone and Midazolam ? ?Airway Management Planned: LMA ? ?Additional Equipment: None ? ?Intra-op Plan:  ? ?Post-operative Plan: Extubation in OR ? ?Informed Consent: I have reviewed the patients History and Physical, chart, labs and discussed the procedure including the risks, benefits and alternatives for the proposed anesthesia with the patient or authorized representative who has indicated his/her understanding and acceptance.  ? ? ? ?Dental advisory  given ? ?Plan Discussed with: CRNA ? ?Anesthesia Plan Comments: (PAT note written 12/24/2021 by Shonna Chock, PA-C. ?)  ? ? ? ? ? ?Anesthesia Quick Evaluation ? ?

## 2021-12-24 NOTE — Progress Notes (Signed)
[name] denies chest pain or shortness of breath..  Patient denies having any s/s of Covid in his household.  Patient denies any known exposure to Covid. [name] verifies that he did not take Coumadin 3/29 and will not take 3/30 or 3/31. ? ?PCP is with [facility]. Cardiologist is Dr. Ival Bible. ? ?Mr Adam Long has type II diabetes, patient reports that am CBG run around 140.Last  A1C was drawn on 10/30/21. I instructed [name] to not take Metfornin in am.  I instructed patient to check CBG after awaking and every 2 hours until arrival  to the hospital.  ?I Instructed [name] if CBG is less than 70 to take 4 Glucose Tablets or 1 tube of Glucose Gel or 1/2 cup of a clear juice. Recheck CBG in 15 minutes if CBG is not over 70 call, pre- op desk at [phone] for further instructions.  ? ?I instructed [name] to shower with antibacteria soap.  No nail polish, artificial or acrylic nails. Wear clean clothes, brush your teeth. ?Glasses, contact lens,dentures or partials may not be worn in the OR. If you need to wear them, please bring a case for glasses, do not wear contacts or bring a case, the hospital does not have contact cases, dentures or partials will have to be removed , make sure they are clean, we will provide a denture cup to put them in. You will need some one to drive you home and a responsible person over the age of 64 to stay with you for the first 24 hours after surgery.  ?

## 2021-12-24 NOTE — Progress Notes (Signed)
Anesthesia Chart Review: SAME DAY WORK-UP ? Case: [id number] Date/Time: 12/25/21 1058  ? Procedure: REVISION LEFT BELOW KNEE AMPUTATION (Left)  ? Anesthesia type: Choice  ? Pre-op diagnosis: Dehiscence Left Below Knee Amputation  ? [facility] / [facility]  ? Surgeons: Nadara Mustard, MD  ? ?  ? ? ?DISCUSSION:  Patient is a 46 year old male scheduled for the above procedure. He is s/p revision of left BKA for wound dehiscence 10/30/21.  He had follow-up with Dr. Lajoyce Corners on 12/21/21 with progressive ulceration and wound dehiscence.  Further revision or conversion to above-knee amputation discussed.  Patient opted to proceed with revision as he was concerned he may not be able to ambulate with an AKA prosthesis. ?  ?History includes never smoker (quit smokeless tobacco 09/28/19), HTN, DM2, hypercholesterolemia, murmur/bicuspid AV with severe AS (s/p partial upper hemi-sternotomy for mechanical AVR, 25 mm Carbomedics Top Hat bileaflet), exertional dyspnea, cellulitis/osteomyelitis (s/p left 4th & 5th toe amputations 07/09/19; left 4th & 5th tomes ray amputations 03/11/21; left BKA 08/28/21 in setting of diabetic foot wound with MSSA bacteremia, s/p revision for dehiscence 10/30/21), morbid obesity. Pre-AVR cath on 09/24/19 showed non-obstructive CAD (40-50% LAD), severe pulmonary hypertension with mean PA pressure 51 mmHg, severe AS.  ?  ?He has a mechanical AVR and is on warfarin. His INR was supratherapeutic  at 5.0 on 12/23/21.  This was in the setting of Bactrim DS and Keflex till 12/25/21 with pending left BKA revision. Per [facility] warfarin clinic, he was to hold warfarin 12/23/21 and 12/24/21 and reduce regimen to 1 mg daily except 1/2 tablet on MWF.  PAT RN communicated that patient would be planning to resume 12/25/21 PM. I don't see that he has another visit to recheck his INR until after surgery, so will order a STAT PT/INR on arrival for updated INR given surgery and potential increased bleeding risks with  supratherapeutic INR. Dr. Lajoyce Corners messaged regarding this.  ? ?Anesthesia team to evaluate on the day of surgery.  ? ? ?VS:  ?BP Readings from Last 3 Encounters:  ?12/07/21 138/81  ?10/31/21 106/64  ?10/29/21 120/77  ? ?Pulse Readings from Last 3 Encounters:  ?12/07/21 95  ?10/31/21 97  ?10/29/21 93  ?  ? ?PROVIDERS: ?No PCP is listed. Last primary care visit seen if on 10/19/21 with Edwin Dada, NP at [facility].  ?- Nona Dell, MD is primary cardiologist. Last office visit 09/09/20.  ?- Ronni Rumble, MD is endocrinologist. Last visit 01/24/20.  ?- Gwynn Burly, DO is ID. Last visit 10/13/21. "Patient completed 4 weeks of IV cefazolin for MSSA/GBS bacteremia in setting of left diabetic foot ulcer s/p definitive source control with BKA.  PICC line was removed a couple weeks ago and reports no new concerns.  TEE was negative for vegetations in the setting of prosthetic aortic valve.  RTC as needed."  ?  ? ?LABS: For labs on the day of surgery as indicated.  As of 10/31/2021, sodium 132, glucose 152, A1c 6.4, creatinine 0.77, hemoglobin 10.2, hematocrit 31.3, platelet count 223.  INR on 12/23/2021 was supratherapeutic at 5.0. See DISCUSSION. ?  ? ?EKG: 03/11/21: NSR ?  ?  ?CV: ?TEE 09/01/21: ?IMPRESSIONS  ? 1. Left ventricular ejection fraction, by estimation, is 60 to 65%. The  ?left ventricle has normal function. The left ventricle has no regional  ?wall motion abnormalities.  ? 2. Right ventricular systolic function is normal. The right ventricular  ?size is mildly enlarged.  ?  3. No left atrial/left atrial appendage thrombus was detected.  ? 4. The mitral valve is normal in structure. Trivial mitral valve  ?regurgitation.  ? 5. There is a 25 mm bileaflet valve present in the aortic position  ?    Aortic valve regurgitation is not visualized. Echo findings are  ?consistent with normal structure and function of the aortic valve  ?prosthesis. Aortic valve mean gradient measures 13.0 mmHg.   ?- Conclusion(s)/Recommendation(s): No vegetation seen. Pulmonic valve poorly  ?visualized due to shadowing from mechanical aortic valve. No clear  ?vegetation on mechanical aortic valve. Would monitor serial echocardiogram  ?while treating bacteremia if clinical  ? suspicion for endocarditis.  ?  ?  ?TTE 08/29/21: ?IMPRESSIONS  ? 1. Left ventricular ejection fraction, by estimation, is 60 to 65%. The  ?left ventricle has normal function. The left ventricle has no regional  ?wall motion abnormalities. There is mild left ventricular hypertrophy.  ?Left ventricular diastolic parameters  ?were normal.  ? 2. Right ventricular systolic function is normal. The right ventricular  ?size is normal.  ? 3. Trivial mitral valve regurgitation. Moderate mitral annular  ?calcification.  ? 4. 25 mm Carbomedics Top Hat bileaflet mechanical prosthesis present Peak  ?and mean gradients through the valve are 34 and 20 mm Hg respectively.  ?COmpared to study from 2021, mean gradient is mildly increased. Leaflets  ?not well seen . The aortic valve  ?has been repaired/replaced. Aortic valve regurgitation is not visualized.  ?There is a 25 mm mechanical valve present in the aortic position.  ? 5. The inferior vena cava is normal in size with greater than 50%  ?respiratory variability, suggesting right atrial pressure of 3 mmHg.  ?  ?  ?US Carotid 12/03/19: ?Summary:  ?- Right Carotid: The extracranial vessels were near-normal with only minimal wall thickening or plaque.  ?- Left Carotid: The extracranial vessels were near-normal with only minimal wall thickening or plaque.  ?- Vertebrals:  Bilateral vertebral arteries demonstrate antegrade flow.  ?- Subclavians: Bilateral subclavian arteries were not visualized.  ?  ?  ?Cardiac cath 09/24/19: ?LM: Mildly calcified. Patent without stenosis. Divides into the LAD and left circumflex. ?LAD: Mild diffuse disease throughout the vessel. The vessel is severely calcified. Mid LAD after the first  diagonal has 40 to 50% stenosis.  ?LCX: Vessel is small with luminal irregularities. Small OM1. ?RCA: Large vessel. Mild diffuse disease throughout vessel. Divides into the PDA and posterior AV segment which supplies a single PLA branch.  ?1.  Calcified coronary arteries with moderate mid LAD stenosis, otherwise mild nonobstructive disease ?2.  Known severe aortic stenosis by echo assessment with heavy calcification and restriction of the aortic valve leaflets as assessed by plain fluoroscopy ?3.  Severe pulmonary hypertension with mean PA pressure 51 mmHg, at least in part to left heart disease with elevated wedge pressure of 31 mmHg ?Recommendations: Ongoing multidisciplinary heart team evaluation for treatment of severe symptomatic aortic stenosis.  Medical therapy for nonobstructive CAD. ? ? ?Past Medical History:  ?Diagnosis Date  ? Anxiety   ? Aortic stenosis   ? Cellulitis and abscess of lower extremity 06/11/2019  ? Cellulitis of fourth toe of left foot   ? Cholelithiasis   ? Coronary artery disease   ? Nonobstructive CAD (40-50% LAD) 08/2019  ? Depression   ? Diabetes mellitus without complication (HCC)   ? Phreesia 09/27/2020  ? Elevated troponin level not due myocardial infarction 11/11/2019  ? Essential hypertension   ? Gangrene of toe  of left foot (HCC) 07/06/2019  ? Heart murmur   ? Phreesia 09/27/2020  ? Hyperlipidemia   ? Phreesia 09/27/2020  ? Hypertension   ? Phreesia 09/27/2020  ? Mixed hyperlipidemia   ? Morbid obesity (HCC)   ? S/P aortic valve replacement with mechanical valve 12/05/2019  ? 25 mm Carbomedics top hat bileaflet mechanical valve via partial upper hemi-sternotomy  ? Severe aortic stenosis 09/24/2019  ? Type 2 diabetes mellitus (HCC)   ? ? ?Past Surgical History:  ?Procedure Laterality Date  ? ABDOMINAL AORTOGRAM W/LOWER EXTREMITY N/A 07/06/2019  ? Procedure: ABDOMINAL AORTOGRAM W/LOWER EXTREMITY;  Surgeon: Sherren Kerns, MD;  Location: [facility];  Service:  Cardiovascular;  Laterality: N/A;  Bilateral  ? AMPUTATION Left 07/09/2019  ? Procedure: LEFT FOURTH and Fifth TOE AMPUTATION.;  Surgeon: Larina Earthly, MD;  Location: [facility];  Service: Vascular;  Laterality: Left;  ? AMPUTATION

## 2021-12-25 ENCOUNTER — Inpatient Hospital Stay (HOSPITAL_COMMUNITY)
Admission: AD | Admit: 2021-12-25 | Discharge: 2021-12-29 | DRG: 493 | Disposition: A | Discharge status: Home Health | Payer: 59 | Attending: Orthopedic Surgery | Admitting: Orthopedic Surgery

## 2021-12-25 ENCOUNTER — Ambulatory Visit (HOSPITAL_COMMUNITY): Payer: 59 | Admitting: Vascular Surgery

## 2021-12-25 ENCOUNTER — Encounter (HOSPITAL_COMMUNITY)
Admission: AD | Disposition: A | Discharge status: Home Health | Payer: Self-pay | Source: Home / Self Care | Attending: Orthopedic Surgery

## 2021-12-25 ENCOUNTER — Encounter (HOSPITAL_COMMUNITY): Payer: Self-pay | Admitting: Orthopedic Surgery

## 2021-12-25 ENCOUNTER — Other Ambulatory Visit: Payer: Self-pay

## 2021-12-25 DIAGNOSIS — Z6841 Body Mass Index (BMI) 40.0 and over, adult: Secondary | ICD-10-CM

## 2021-12-25 DIAGNOSIS — T8781 Dehiscence of amputation stump: Principal | ICD-10-CM

## 2021-12-25 DIAGNOSIS — E1152 Type 2 diabetes mellitus with diabetic peripheral angiopathy with gangrene: Secondary | ICD-10-CM | POA: Diagnosis present

## 2021-12-25 DIAGNOSIS — I10 Essential (primary) hypertension: Secondary | ICD-10-CM | POA: Diagnosis present

## 2021-12-25 DIAGNOSIS — Z87891 Personal history of nicotine dependence: Secondary | ICD-10-CM | POA: Diagnosis not present

## 2021-12-25 DIAGNOSIS — B952 Enterococcus as the cause of diseases classified elsewhere: Secondary | ICD-10-CM | POA: Diagnosis present

## 2021-12-25 DIAGNOSIS — E1169 Type 2 diabetes mellitus with other specified complication: Secondary | ICD-10-CM | POA: Diagnosis present

## 2021-12-25 DIAGNOSIS — B961 Klebsiella pneumoniae [K. pneumoniae] as the cause of diseases classified elsewhere: Secondary | ICD-10-CM | POA: Diagnosis present

## 2021-12-25 DIAGNOSIS — M869 Osteomyelitis, unspecified: Secondary | ICD-10-CM | POA: Diagnosis present

## 2021-12-25 DIAGNOSIS — I2510 Atherosclerotic heart disease of native coronary artery without angina pectoris: Secondary | ICD-10-CM | POA: Diagnosis present

## 2021-12-25 DIAGNOSIS — S88112A Complete traumatic amputation at level between knee and ankle, left lower leg, initial encounter: Secondary | ICD-10-CM | POA: Diagnosis present

## 2021-12-25 DIAGNOSIS — Z1612 Extended spectrum beta lactamase (ESBL) resistance: Secondary | ICD-10-CM | POA: Diagnosis present

## 2021-12-25 DIAGNOSIS — T8744 Infection of amputation stump, left lower extremity: Secondary | ICD-10-CM | POA: Diagnosis present

## 2021-12-25 DIAGNOSIS — R791 Abnormal coagulation profile: Principal | ICD-10-CM

## 2021-12-25 DIAGNOSIS — Z952 Presence of prosthetic heart valve: Secondary | ICD-10-CM

## 2021-12-25 DIAGNOSIS — Y835 Amputation of limb(s) as the cause of abnormal reaction of the patient, or of later complication, without mention of misadventure at the time of the procedure: Secondary | ICD-10-CM | POA: Diagnosis present

## 2021-12-25 DIAGNOSIS — Z89512 Acquired absence of left leg below knee: Secondary | ICD-10-CM

## 2021-12-25 DIAGNOSIS — B967 Clostridium perfringens [C. perfringens] as the cause of diseases classified elsewhere: Secondary | ICD-10-CM | POA: Diagnosis present

## 2021-12-25 DIAGNOSIS — E119 Type 2 diabetes mellitus without complications: Secondary | ICD-10-CM

## 2021-12-25 HISTORY — PX: APPLICATION OF WOUND VAC: SHX5189

## 2021-12-25 HISTORY — PX: STUMP REVISION: SHX6102

## 2021-12-25 LAB — BASIC METABOLIC PANEL
Anion gap: 10 (ref 5–15)
BUN: 14 mg/dL (ref 6–20)
CO2: 21 mmol/L — ABNORMAL LOW (ref 22–32)
Calcium: 9.4 mg/dL (ref 8.9–10.3)
Chloride: 100 mmol/L (ref 98–111)
Creatinine, Ser: 0.82 mg/dL (ref 0.61–1.24)
GFR, Estimated: >60 mL/min (ref >60)
Glucose, Bld: 177 mg/dL — ABNORMAL HIGH (ref 70–99)
Potassium: 5.9 mmol/L — ABNORMAL HIGH (ref 3.5–5.1)
Sodium: 131 mmol/L — ABNORMAL LOW (ref 135–145)

## 2021-12-25 LAB — GLUCOSE, CAPILLARY
Glucose-Capillary: 192 mg/dL — ABNORMAL HIGH (ref 70–99)
Glucose-Capillary: 145 mg/dL — ABNORMAL HIGH (ref 70–99)
Glucose-Capillary: 145 mg/dL — ABNORMAL HIGH (ref 70–99)
Glucose-Capillary: 157 mg/dL — ABNORMAL HIGH (ref 70–99)
Glucose-Capillary: 168 mg/dL — ABNORMAL HIGH (ref 70–99)

## 2021-12-25 LAB — CBC
HCT: 36.3 % — ABNORMAL LOW (ref 39.0–52.0)
Hemoglobin: 10.8 g/dL — ABNORMAL LOW (ref 13.0–17.0)
MCH: 22.8 pg — ABNORMAL LOW (ref 26.0–34.0)
MCHC: 29.8 g/dL — ABNORMAL LOW (ref 30.0–36.0)
MCV: 76.6 fL — ABNORMAL LOW (ref 80.0–100.0)
Platelets: 315 10*3/uL (ref 150–400)
RBC: 4.74 MIL/uL (ref 4.22–5.81)
RDW: 16.2 % — ABNORMAL HIGH (ref 11.5–15.5)
WBC: 10.4 10*3/uL (ref 4.0–10.5)
nRBC: 0 % (ref 0.0–0.2)

## 2021-12-25 LAB — POCT I-STAT, CHEM 8
BUN: 15 mg/dL (ref 6–20)
Calcium, Ion: 1.22 mmol/L (ref 1.15–1.40)
Chloride: 100 mmol/L (ref 98–111)
Creatinine, Ser: 0.6 mg/dL — ABNORMAL LOW (ref 0.61–1.24)
Glucose, Bld: 158 mg/dL — ABNORMAL HIGH (ref 70–99)
HCT: 35 % — ABNORMAL LOW (ref 39.0–52.0)
Hemoglobin: 11.9 g/dL — ABNORMAL LOW (ref 13.0–17.0)
Potassium: 5.1 mmol/L (ref 3.5–5.1)
Sodium: 133 mmol/L — ABNORMAL LOW (ref 135–145)
TCO2: 23 mmol/L (ref 22–32)

## 2021-12-25 LAB — PROTIME-INR
INR: 2.9 — ABNORMAL HIGH (ref 0.8–1.2)
Prothrombin Time: 30.6 seconds — ABNORMAL HIGH (ref 11.4–15.2)

## 2021-12-25 LAB — APTT: aPTT: 53 seconds — ABNORMAL HIGH (ref 24–36)

## 2021-12-25 SURGERY — REVISION, AMPUTATION SITE
Anesthesia: General | Site: Leg Lower | Laterality: Left

## 2021-12-25 MED ORDER — LABETALOL HCL 5 MG/ML IV SOLN: 10.0000 mg | INTRAVENOUS | Status: DC | PRN

## 2021-12-25 MED ORDER — CEFAZOLIN SODIUM-DEXTROSE 2-4 GM/100ML-% IV SOLN: 2.0000 g | Freq: Three times a day (TID) | INTRAVENOUS | Status: DC

## 2021-12-25 MED ORDER — LIDOCAINE 2% (20 MG/ML) 5 ML SYRINGE
INTRAMUSCULAR | Status: AC
  Filled 2021-12-25: qty 5

## 2021-12-25 MED ORDER — OXYCODONE HCL 5 MG PO TABS
10.0000 mg | ORAL_TABLET | ORAL | Status: DC | PRN
  Administered 2021-12-26 – 2021-12-28 (×11): 15 mg via ORAL
  Filled 2021-12-25 (×11): qty 3

## 2021-12-25 MED ORDER — PANTOPRAZOLE SODIUM 40 MG PO TBEC: 40.0000 mg | DELAYED_RELEASE_TABLET | Freq: Every day | ORAL | Status: DC

## 2021-12-25 MED ORDER — GUAIFENESIN-DM 100-10 MG/5ML PO SYRP: 15.0000 mL | ORAL_SOLUTION | ORAL | Status: DC | PRN

## 2021-12-25 MED ORDER — TRANEXAMIC ACID-NACL 1000-0.7 MG/100ML-% IV SOLN
1000.0000 mg | INTRAVENOUS | Status: AC
  Administered 2021-12-25: 1000 mg via INTRAVENOUS
  Filled 2021-12-25: qty 100

## 2021-12-25 MED ORDER — LIDOCAINE 2% (20 MG/ML) 5 ML SYRINGE
INTRAMUSCULAR | Status: DC | PRN
  Administered 2021-12-25: 100 mg via INTRAVENOUS

## 2021-12-25 MED ORDER — ALUM & MAG HYDROXIDE-SIMETH 200-200-20 MG/5ML PO SUSP: 15.0000 mL | ORAL | Status: DC | PRN

## 2021-12-25 MED ORDER — HYDRALAZINE HCL 20 MG/ML IJ SOLN: 5.0000 mg | INTRAMUSCULAR | Status: DC | PRN

## 2021-12-25 MED ORDER — HYDROMORPHONE HCL 1 MG/ML IJ SOLN: 0.5000 mg | INTRAMUSCULAR | Status: DC | PRN

## 2021-12-25 MED ORDER — MAGNESIUM SULFATE 2 GM/50ML IV SOLN: 2.0000 g | Freq: Every day | INTRAVENOUS | Status: DC | PRN

## 2021-12-25 MED ORDER — FENTANYL CITRATE (PF) 100 MCG/2ML IJ SOLN: 50.0000 ug | Freq: Once | INTRAMUSCULAR | Status: AC

## 2021-12-25 MED ORDER — KETAMINE HCL 50 MG/5ML IJ SOSY
PREFILLED_SYRINGE | INTRAMUSCULAR | Status: AC
  Filled 2021-12-25: qty 5

## 2021-12-25 MED ORDER — PROPOFOL 10 MG/ML IV BOLUS
INTRAVENOUS | Status: AC
  Filled 2021-12-25: qty 20

## 2021-12-25 MED ORDER — ONDANSETRON HCL 4 MG/2ML IJ SOLN: 4.0000 mg | Freq: Four times a day (QID) | INTRAMUSCULAR | Status: DC | PRN

## 2021-12-25 MED ORDER — LACTATED RINGERS IV SOLN: INTRAVENOUS | Status: DC

## 2021-12-25 MED ORDER — POLYETHYLENE GLYCOL 3350 17 G PO PACK: 17.0000 g | PACK | Freq: Every day | ORAL | Status: DC | PRN

## 2021-12-25 MED ORDER — ATORVASTATIN CALCIUM 80 MG PO TABS
80.0000 mg | ORAL_TABLET | Freq: Every day | ORAL | Status: DC
  Administered 2021-12-25 – 2021-12-29 (×5): 80 mg via ORAL
  Filled 2021-12-25 (×5): qty 1

## 2021-12-25 MED ORDER — TRANEXAMIC ACID-NACL 1000-0.7 MG/100ML-% IV SOLN
INTRAVENOUS | Status: AC
  Filled 2021-12-25: qty 100

## 2021-12-25 MED ORDER — CEFAZOLIN SODIUM-DEXTROSE 2-4 GM/100ML-% IV SOLN
2.0000 g | Freq: Three times a day (TID) | INTRAVENOUS | Status: AC
  Administered 2021-12-25: 2 g via INTRAVENOUS
  Filled 2021-12-25: qty 100

## 2021-12-25 MED ORDER — JUVEN PO PACK
1.0000 | PACK | Freq: Two times a day (BID) | ORAL | Status: DC
  Administered 2021-12-25 – 2021-12-29 (×7): 1 via ORAL
  Filled 2021-12-25 (×7): qty 1

## 2021-12-25 MED ORDER — METFORMIN HCL 500 MG PO TABS
500.0000 mg | ORAL_TABLET | Freq: Two times a day (BID) | ORAL | Status: DC
  Administered 2021-12-25 – 2021-12-29 (×8): 500 mg via ORAL
  Filled 2021-12-25 (×8): qty 1

## 2021-12-25 MED ORDER — BISACODYL 5 MG PO TBEC: 5.0000 mg | DELAYED_RELEASE_TABLET | Freq: Every day | ORAL | Status: DC | PRN

## 2021-12-25 MED ORDER — TRANEXAMIC ACID-NACL 1000-0.7 MG/100ML-% IV SOLN: 1000.0000 mg | INTRAVENOUS | Status: DC

## 2021-12-25 MED ORDER — ZINC SULFATE 220 (50 ZN) MG PO CAPS
220.0000 mg | ORAL_CAPSULE | Freq: Every day | ORAL | Status: DC
  Administered 2021-12-25 – 2021-12-29 (×5): 220 mg via ORAL
  Filled 2021-12-25 (×5): qty 1

## 2021-12-25 MED ORDER — PHENYLEPHRINE 40 MCG/ML (10ML) SYRINGE FOR IV PUSH (FOR BLOOD PRESSURE SUPPORT)
PREFILLED_SYRINGE | INTRAVENOUS | Status: DC | PRN
  Administered 2021-12-25: 120 ug via INTRAVENOUS
  Administered 2021-12-25: 80 ug via INTRAVENOUS
  Administered 2021-12-25: 120 ug via INTRAVENOUS
  Administered 2021-12-25 (×3): 80 ug via INTRAVENOUS
  Administered 2021-12-25: 120 ug via INTRAVENOUS

## 2021-12-25 MED ORDER — 0.9 % SODIUM CHLORIDE (POUR BTL) OPTIME
TOPICAL | Status: DC | PRN
  Administered 2021-12-25: 1000 mL

## 2021-12-25 MED ORDER — PANTOPRAZOLE SODIUM 40 MG PO TBEC
40.0000 mg | DELAYED_RELEASE_TABLET | Freq: Every day | ORAL | Status: DC
  Administered 2021-12-25 – 2021-12-29 (×5): 40 mg via ORAL
  Filled 2021-12-25 (×5): qty 1

## 2021-12-25 MED ORDER — MIDAZOLAM HCL 2 MG/2ML IJ SOLN: 1.0000 mg | Freq: Once | INTRAMUSCULAR | Status: AC

## 2021-12-25 MED ORDER — VANCOMYCIN HCL 1750 MG/350ML IV SOLN
1750.0000 mg | Freq: Three times a day (TID) | INTRAVENOUS | Status: DC
  Administered 2021-12-26 (×2): 1750 mg via INTRAVENOUS
  Filled 2021-12-25 (×2): qty 350

## 2021-12-25 MED ORDER — METOPROLOL TARTRATE 25 MG PO TABS
25.0000 mg | ORAL_TABLET | Freq: Two times a day (BID) | ORAL | Status: DC
  Administered 2021-12-26 – 2021-12-29 (×7): 25 mg via ORAL
  Filled 2021-12-25 (×7): qty 1

## 2021-12-25 MED ORDER — SERTRALINE HCL 50 MG PO TABS
50.0000 mg | ORAL_TABLET | Freq: Every day | ORAL | Status: DC
Start: 2021-12-26 — End: 2021-12-29
  Administered 2021-12-26 – 2021-12-29 (×4): 50 mg via ORAL
  Filled 2021-12-25 (×4): qty 1

## 2021-12-25 MED ORDER — MIDAZOLAM HCL 2 MG/2ML IJ SOLN
INTRAMUSCULAR | Status: AC
  Administered 2021-12-25: 1 mg via INTRAVENOUS
  Filled 2021-12-25: qty 2

## 2021-12-25 MED ORDER — LISINOPRIL 10 MG PO TABS
10.0000 mg | ORAL_TABLET | Freq: Every day | ORAL | Status: DC
  Administered 2021-12-25 – 2021-12-29 (×4): 10 mg via ORAL
  Filled 2021-12-25 (×5): qty 1

## 2021-12-25 MED ORDER — ASCORBIC ACID 500 MG PO TABS
1000.0000 mg | ORAL_TABLET | Freq: Every day | ORAL | Status: DC
  Administered 2021-12-25 – 2021-12-29 (×5): 1000 mg via ORAL
  Filled 2021-12-25 (×5): qty 2

## 2021-12-25 MED ORDER — INSULIN ASPART 100 UNIT/ML IJ SOLN
INTRAMUSCULAR | Status: AC
  Administered 2021-12-25: 2 [IU] via SUBCUTANEOUS
  Filled 2021-12-25: qty 1

## 2021-12-25 MED ORDER — SODIUM CHLORIDE 0.9 % IV SOLN: INTRAVENOUS | Status: DC

## 2021-12-25 MED ORDER — WARFARIN SODIUM 5 MG PO TABS
5.0000 mg | ORAL_TABLET | Freq: Once | ORAL | Status: AC
  Administered 2021-12-25: 5 mg via ORAL
  Filled 2021-12-25: qty 1

## 2021-12-25 MED ORDER — GABAPENTIN 300 MG PO CAPS
600.0000 mg | ORAL_CAPSULE | Freq: Two times a day (BID) | ORAL | Status: DC
  Administered 2021-12-25 – 2021-12-29 (×8): 600 mg via ORAL
  Filled 2021-12-25 (×8): qty 2

## 2021-12-25 MED ORDER — METOPROLOL TARTRATE 5 MG/5ML IV SOLN: 2.0000 mg | INTRAVENOUS | Status: DC | PRN

## 2021-12-25 MED ORDER — WARFARIN - PHARMACIST DOSING INPATIENT: Freq: Every day | Status: DC

## 2021-12-25 MED ORDER — BUPIVACAINE-EPINEPHRINE (PF) 0.5% -1:200000 IJ SOLN
INTRAMUSCULAR | Status: DC | PRN
  Administered 2021-12-25: 20 mL via PERINEURAL
  Administered 2021-12-25: 10 mL via PERINEURAL

## 2021-12-25 MED ORDER — INSULIN ASPART 100 UNIT/ML IJ SOLN
0.0000 [IU] | Freq: Three times a day (TID) | INTRAMUSCULAR | Status: DC
  Administered 2021-12-25 – 2021-12-26 (×4): 3 [IU] via SUBCUTANEOUS
  Administered 2021-12-27: 2 [IU] via SUBCUTANEOUS
  Administered 2021-12-27 – 2021-12-28 (×3): 3 [IU] via SUBCUTANEOUS
  Administered 2021-12-28 (×2): 2 [IU] via SUBCUTANEOUS
  Administered 2021-12-29: 3 [IU] via SUBCUTANEOUS
  Administered 2021-12-29: 2 [IU] via SUBCUTANEOUS

## 2021-12-25 MED ORDER — MAGNESIUM CITRATE PO SOLN
1.0000 | Freq: Once | ORAL | Status: DC | PRN
  Filled 2021-12-25: qty 296

## 2021-12-25 MED ORDER — CLONIDINE HCL (ANALGESIA) 100 MCG/ML EP SOLN
EPIDURAL | Status: DC | PRN
  Administered 2021-12-25: 67 ug
  Administered 2021-12-25: 33 ug

## 2021-12-25 MED ORDER — POTASSIUM CHLORIDE CRYS ER 20 MEQ PO TBCR: 20.0000 meq | EXTENDED_RELEASE_TABLET | Freq: Every day | ORAL | Status: DC | PRN

## 2021-12-25 MED ORDER — DOCUSATE SODIUM 100 MG PO CAPS
100.0000 mg | ORAL_CAPSULE | Freq: Every day | ORAL | Status: DC
  Administered 2021-12-26 – 2021-12-29 (×4): 100 mg via ORAL
  Filled 2021-12-25 (×4): qty 1

## 2021-12-25 MED ORDER — CEFAZOLIN SODIUM-DEXTROSE 1-4 GM/50ML-% IV SOLN
INTRAVENOUS | Status: AC
  Filled 2021-12-25: qty 50

## 2021-12-25 MED ORDER — OXYCODONE HCL 5 MG PO TABS
5.0000 mg | ORAL_TABLET | ORAL | Status: DC | PRN
  Administered 2021-12-26: 5 mg via ORAL
  Administered 2021-12-29: 10 mg via ORAL
  Filled 2021-12-25: qty 1
  Filled 2021-12-25: qty 2

## 2021-12-25 MED ORDER — INSULIN ASPART 100 UNIT/ML IJ SOLN
0.0000 [IU] | INTRAMUSCULAR | Status: AC | PRN
  Administered 2021-12-25: 4 [IU] via SUBCUTANEOUS
  Filled 2021-12-25: qty 1

## 2021-12-25 MED ORDER — ACETAMINOPHEN 325 MG PO TABS
325.0000 mg | ORAL_TABLET | Freq: Four times a day (QID) | ORAL | Status: DC | PRN
  Administered 2021-12-27 – 2021-12-28 (×4): 650 mg via ORAL
  Filled 2021-12-25 (×4): qty 2

## 2021-12-25 MED ORDER — ORAL CARE MOUTH RINSE: 15.0000 mL | Freq: Once | OROMUCOSAL | Status: AC

## 2021-12-25 MED ORDER — VANCOMYCIN HCL 2000 MG/400ML IV SOLN
2000.0000 mg | Freq: Once | INTRAVENOUS | Status: AC
  Administered 2021-12-25: 2000 mg via INTRAVENOUS
  Filled 2021-12-25: qty 400

## 2021-12-25 MED ORDER — PHENOL 1.4 % MT LIQD: 1.0000 | OROMUCOSAL | Status: DC | PRN

## 2021-12-25 MED ORDER — ONDANSETRON HCL 4 MG/2ML IJ SOLN
INTRAMUSCULAR | Status: DC | PRN
  Administered 2021-12-25: 4 mg via INTRAVENOUS

## 2021-12-25 MED ORDER — FENTANYL CITRATE (PF) 100 MCG/2ML IJ SOLN
INTRAMUSCULAR | Status: AC
  Administered 2021-12-25: 50 ug via INTRAVENOUS
  Filled 2021-12-25: qty 2

## 2021-12-25 MED ORDER — CHLORHEXIDINE GLUCONATE 0.12 % MT SOLN
15.0000 mL | Freq: Once | OROMUCOSAL | Status: AC
  Administered 2021-12-25: 15 mL via OROMUCOSAL
  Filled 2021-12-25: qty 15

## 2021-12-25 MED ORDER — CEFAZOLIN SODIUM-DEXTROSE 2-4 GM/100ML-% IV SOLN
2.0000 g | INTRAVENOUS | Status: AC
  Administered 2021-12-25: 3 g via INTRAVENOUS
  Filled 2021-12-25: qty 100

## 2021-12-25 MED ORDER — PROPOFOL 10 MG/ML IV BOLUS
INTRAVENOUS | Status: DC | PRN
  Administered 2021-12-25: 30 mg via INTRAVENOUS
  Administered 2021-12-25: 200 mg via INTRAVENOUS
  Administered 2021-12-25: 50 mg via INTRAVENOUS

## 2021-12-25 SURGICAL SUPPLY — 35 items
BAG COUNTER SPONGE SURGICOUNT (BAG) ×3 IMPLANT
BLADE SAW RECIP 87.9 MT (BLADE) ×1 IMPLANT
BLADE SURG 21 STRL SS (BLADE) ×3 IMPLANT
CANISTER WOUND CARE 500ML ATS (WOUND CARE) ×3 IMPLANT
CANISTER WOUNDNEG PRESSURE 500 (CANNISTER) ×1 IMPLANT
CNTNR URN SCR LID CUP LEK RST (MISCELLANEOUS) IMPLANT
CONT SPEC 4OZ STRL OR WHT (MISCELLANEOUS) ×3
COVER SURGICAL LIGHT HANDLE (MISCELLANEOUS) ×4 IMPLANT
DRAPE DERMATAC (DRAPES) ×2 IMPLANT
DRAPE EXTREMITY T 121X128X90 (DISPOSABLE) ×3 IMPLANT
DRAPE HALF SHEET 40X57 (DRAPES) ×3 IMPLANT
DRAPE INCISE IOBAN 66X45 STRL (DRAPES) ×3 IMPLANT
DRAPE U-SHAPE 47X51 STRL (DRAPES) ×6 IMPLANT
DRESSING PREVENA PLUS CUSTOM (GAUZE/BANDAGES/DRESSINGS) ×2 IMPLANT
DRSG PREVENA PLUS CUSTOM (GAUZE/BANDAGES/DRESSINGS) ×3
DURAPREP 26ML APPLICATOR (WOUND CARE) ×3 IMPLANT
ELECT REM PT RETURN 9FT ADLT (ELECTROSURGICAL) ×3
ELECTRODE REM PT RTRN 9FT ADLT (ELECTROSURGICAL) ×2 IMPLANT
GLOVE SURG ORTHO LTX SZ9 (GLOVE) ×3 IMPLANT
GLOVE SURG UNDER POLY LF SZ9 (GLOVE) ×3 IMPLANT
GOWN STRL REUS W/ TWL XL LVL3 (GOWN DISPOSABLE) ×4 IMPLANT
GOWN STRL REUS W/TWL XL LVL3 (GOWN DISPOSABLE) ×6
GRAFT SKIN WND MICRO 38 (Tissue) ×1 IMPLANT
KIT BASIN OR (CUSTOM PROCEDURE TRAY) ×3 IMPLANT
KIT TURNOVER KIT B (KITS) ×3 IMPLANT
MANIFOLD NEPTUNE II (INSTRUMENTS) ×3 IMPLANT
NS IRRIG 1000ML POUR BTL (IV SOLUTION) ×3 IMPLANT
PACK GENERAL/GYN (CUSTOM PROCEDURE TRAY) ×3 IMPLANT
PAD ARMBOARD 7.5X6 YLW CONV (MISCELLANEOUS) ×3 IMPLANT
PREVENA RESTOR ARTHOFORM 46X30 (CANNISTER) ×3 IMPLANT
STAPLER VISISTAT 35W (STAPLE) IMPLANT
SUT ETHILON 2 0 PSLX (SUTURE) ×7 IMPLANT
SUT SILK 2 0 (SUTURE) ×3
SUT SILK 2-0 18XBRD TIE 12 (SUTURE) IMPLANT
TOWEL GREEN STERILE (TOWEL DISPOSABLE) ×3 IMPLANT

## 2021-12-25 NOTE — Anesthesia Procedure Notes (Signed)
Anesthesia Regional Block: Adductor canal block  ? ?Pre-Anesthetic Checklist: , timeout performed,  Correct Patient, Correct Site, Correct Laterality,  Correct Procedure, Correct Position, site marked,  Risks and benefits discussed,  Pre-op evaluation,  At surgeon's request and post-op pain management ? ?Laterality: Left ? ?Prep: Maximum Sterile Barrier Precautions used, chloraprep     ?  ?Needles:  ?Injection technique: Single-shot ? ?Needle Type: Echogenic Stimulator Needle   ? ? ?Needle Length: 9cm  ?Needle Gauge: 22  ? ? ? ?Additional Needles: ? ? ?Procedures:,,,, ultrasound used (permanent image in chart),,    ?Narrative:  ?Start time: 12/25/2021 11:25 AM ?End time: 12/25/2021 11:28 AM ?Injection made incrementally with aspirations every 5 mL. ? ?Performed by: Personally  ?Anesthesiologist: Kaylyn Layer, MD ? ?Additional Notes: ?Risks, benefits, and alternative discussed. Patient gave consent for procedure. Patient prepped and draped in sterile fashion. Sedation administered, patient remains easily responsive to voice. Relevant anatomy identified with ultrasound guidance. Local anesthetic given in 5cc increments with no signs or symptoms of intravascular injection. No pain or paraesthesias with injection. Patient monitored throughout procedure with signs of LAST or immediate complications. Tolerated well. Ultrasound image placed in chart.  ?Amalia Greenhouse, MD ? ? ? ? ? ? ?

## 2021-12-25 NOTE — TOC Initial Note (Signed)
Transition of Care (TOC) - Initial/Assessment Note  ? ? ?Patient Details  ?Name: Adam Long ?MRN: [id number] ?Date of Birth: March 10, 1976 ? ?Transition of Care (TOC) CM/SW Contact:    ?Lockie Pares, RN ?Phone Number: ?12/25/2021, 2:22 PM ? ?Clinical Narrative:                 ? ?TOC screen initial note ?The Transition of Care Department [facility]) has reviewed patient and no TOC needs have been identified at this time. We will continue to monitor patient advancement through interdisciplinary progression rounds. If new patient transition needs arise, please place a TOC consult ?  ?  ? ?Patient will need PCP goes to Dr Lajoyce Corners for vascular issues ?Patient Goals and CMS Choice ?  ?  ?  ? ?Expected Discharge Plan and Services ?  ?  ?  ?  ?  ?                ?  ?  ?  ?  ?  ?  ?  ?  ?  ?  ? ?Prior Living Arrangements/Services ?  ?  ?  ?       ?  ?  ?  ?  ? ?Activities of Daily Living ?Home Assistive Devices/Equipment: Walker (specify type), Wheelchair, CBG Meter, Blood pressure cuff, Eyeglasses, Shower chair without back, Bedside commode/3-in-1 ?ADL Screening (condition at time of admission) ?Patient's cognitive ability adequate to safely complete daily activities?: Yes ?Is the patient deaf or have difficulty hearing?: No ?Does the patient have difficulty seeing, even when wearing glasses/contacts?: No ?Does the patient have difficulty concentrating, remembering, or making decisions?: No ?Patient able to express need for assistance with ADLs?: Yes ?Does the patient have difficulty dressing or bathing?: No ?Independently performs ADLs?: Yes (appropriate for developmental age) ?Does the patient have difficulty walking or climbing stairs?: Yes ?Weakness of Legs: None ?Weakness of Arms/Hands: None ? ?Permission Sought/Granted ?  ?  ?   ?   ?   ?   ? ?Emotional Assessment ?  ?  ?  ?  ?  ?  ? ?Admission diagnosis:  Below-knee amputation of left lower extremity (HCC) [W09.811B] ?Patient Active Problem List  ? Diagnosis Date Noted   ? S/P BKA (below knee amputation), left (HCC) 10/30/2021  ? History of below knee amputation, left (HCC) 10/30/2021  ? Dehiscence of amputation stump of left lower extremity (HCC)   ? Below-knee amputation of left lower extremity (HCC) 09/02/2021  ? Severe protein-calorie malnutrition (HCC)   ? MSSA bacteremia   ? Ulcer of left foot (HCC)   ? Subacute osteomyelitis, left ankle and foot (HCC)   ? Diabetic foot infection (HCC) 02/14/2021  ? Diabetic ulcer of left foot associated with type 1 diabetes mellitus, limited to breakdown of skin (HCC)   ? Leukocytosis 02/13/2021  ? Hyponatremia 02/13/2021  ? Normocytic anemia 02/13/2021  ? Hyperglycemia due to diabetes mellitus (HCC) 02/13/2021  ? Open wound of left foot 02/12/2021  ? Left leg swelling 02/02/2021  ? History of complete ray amputation of fifth toe of left foot (HCC) 11/04/2020  ? Immunization due 11/04/2020  ? Anxiety 09/30/2020  ? Preventative health care 12/13/2019  ? S/P aortic valve replacement with mechanical valve 12/05/2019  ? S/P AVR (aortic valve replacement) 12/05/2019  ? Syncope 11/10/2019  ? Personal history of noncompliance with medical treatment, presenting hazards to health 03/16/2018  ? Uncontrolled diabetes mellitus with hyperglycemia, without long-term current use of insulin (HCC) 10/12/2015  ?  Hyperlipidemia associated with type 2 diabetes mellitus (HCC) 10/12/2015  ? Morbid obesity (HCC) 10/12/2015  ? Chest pain 07/02/2013  ? HTN (hypertension) 07/02/2013  ? ?PCP:  Pcp, No ?Pharmacy:   ?[id number] - [place], [place] - [address] ?[address] ?[place] [address] ?Phone: [phone] Fax: [phone] ? ? ? ? ?Social Determinants of Health (SDOH) Interventions ?  ? ?Readmission Risk Interventions ? ?  07/10/2019  ? 10:11 AM  ?Readmission Risk Prevention Plan  ?Post Dischage Appt Complete  ?Medication Screening Complete  ?Transportation Screening Complete  ? ? ? ?

## 2021-12-25 NOTE — Anesthesia Procedure Notes (Signed)
Procedure Name: LMA Insertion ?Date/Time: 12/25/2021 11:56 AM ?Performed by: Marny Lowenstein, CRNA ?Pre-anesthesia Checklist: Patient identified, Emergency Drugs available, Suction available and Patient being monitored ?Patient Re-evaluated:Patient Re-evaluated prior to induction ?Oxygen Delivery Method: Circle system utilized ?Preoxygenation: Pre-oxygenation with 100% oxygen ?Induction Type: IV induction ?Ventilation: Mask ventilation without difficulty ?LMA: LMA inserted ?LMA Size: 4.0 ?Placement Confirmation: positive ETCO2 and breath sounds checked- equal and bilateral ?Tube secured with: Tape ?Dental Injury: Teeth and Oropharynx as per pre-operative assessment  ? ? ? ? ?

## 2021-12-25 NOTE — Anesthesia Postprocedure Evaluation (Signed)
Anesthesia Post Note ? ?Patient: MARISA HUFSTETLER ? ?Procedure(s) Performed: REVISION LEFT BELOW KNEE AMPUTATION (Left: Leg Lower) ?APPLICATION OF WOUND VAC (Left: Knee) ? ?  ? ?Patient location during evaluation: PACU ?Anesthesia Type: General ?Level of consciousness: awake and alert ?Pain management: pain level controlled ?Vital Signs Assessment: post-procedure vital signs reviewed and stable ?Respiratory status: spontaneous breathing, nonlabored ventilation and respiratory function stable ?Cardiovascular status: blood pressure returned to baseline ?Postop Assessment: no apparent nausea or vomiting ?Anesthetic complications: no ? ? ?No notable events documented. ? ?Last Vitals:  ?Vitals:  ? 12/25/21 1330 12/25/21 1345  ?BP: 121/62 (!) 114/56  ?Pulse: (!) 104 (!) 106  ?Resp: 19 19  ?Temp:    ?SpO2: 97% 96%  ?  ?Last Pain:  ?Vitals:  ? 12/25/21 1330  ?TempSrc:   ?PainSc: 0-No pain  ? ? ?  ?  ?  ?  ?  ?  ? ?Shanda Howells ? ? ? ? ?

## 2021-12-25 NOTE — Progress Notes (Signed)
Pharmacy Antibiotic Note ? ?Adam Long is a 46 y.o. male admitted on 12/25/2021 with a wound infection s/p revision of Left BKA.  Pharmacy has been consulted for vancomycin dosing. ? ?Plan: ?Vancomycin 2000mg  IV x 1 ?Vancomycin 1750mg  IV q8h for eAUC 487, Scr used 0.6mg /dl ?Monitor for deescalation and renal function ? ?Height: 5\' 10"  (177.8 cm) ?Weight: (!) 156.5 kg (345 lb) ?IBW/kg (Calculated) : 73 ? ?Temp (24hrs), Avg:98.6 ?F (37 ?C), Min:98 ?F (36.7 ?C), Max:99.6 ?F (37.6 ?C) ? ?Recent Labs  ?Lab 12/25/21 ?1001 12/25/21 ?1053  ?WBC 10.4  --   ?CREATININE 0.82 0.60*  ?  ?Estimated Creatinine Clearance: 175.5 mL/min (A) (by C-G formula based on SCr of 0.6 mg/dL (L)).   ? ?Allergies  ?Allergen Reactions  ? Pollen Extract Other (See Comments)  ?  Runny nose, watery eyes, sneezing  ? ? ?Antimicrobials this admission: ?3/31  vancomycin >>  ? ? ?Dose adjustments this admission: ?N/a ? ?Microbiology results: ?Previous wound culture proteus, e.coli and enterococcus ? ? ?Jourden Delmont A. 12/27/21, PharmD, BCPS, FNKF ?Clinical Pharmacist ?[place] ?Please utilize Amion for appropriate phone number to reach the unit pharmacist [facility]) ? ?12/25/2021 3:07 PM ? ?

## 2021-12-25 NOTE — Anesthesia Procedure Notes (Signed)
Anesthesia Regional Block: Popliteal block  ? ?Pre-Anesthetic Checklist: , timeout performed,  Correct Patient, Correct Site, Correct Laterality,  Correct Procedure, Correct Position, site marked,  Risks and benefits discussed,  Pre-op evaluation,  At surgeon's request and post-op pain management ? ?Laterality: Left ? ?Prep: Maximum Sterile Barrier Precautions used, chloraprep     ?  ?Needles:  ?Injection technique: Single-shot ? ?Needle Type: Echogenic Stimulator Needle   ? ? ?Needle Length: 9cm  ?Needle Gauge: 22  ? ? ? ?Additional Needles: ? ? ?Procedures:,,,, ultrasound used (permanent image in chart),,    ?Narrative:  ?Start time: 12/25/2021 11:30 AM ?End time: 12/25/2021 11:34 AM ?Injection made incrementally with aspirations every 5 mL. ? ?Performed by: Personally  ?Anesthesiologist: Kaylyn Layer, MD ? ?Additional Notes: ?Risks, benefits, and alternative discussed. Patient gave consent for procedure. Patient prepped and draped in sterile fashion. Sedation administered, patient remains easily responsive to voice. Relevant anatomy identified with ultrasound guidance. Difficult to obtain good view due to body habitus and edema but patient did get transient paraesthesia into stump with needle manipulation. Local anesthetic given in 5cc increments with no signs or symptoms of intravascular injection. No pain or paraesthesias with injection. Patient monitored throughout procedure with signs of LAST or immediate complications. Tolerated well. Ultrasound image placed in chart.  ?Amalia Greenhouse, MD ? ? ? ? ? ? ?

## 2021-12-25 NOTE — Progress Notes (Signed)
Wound vac alarm was sounding and found that 500 ml canister was found to be full of bright red blood.  Called Dr. Lajoyce Corners or gave orders to stop wound vac for 1 hour, give 1 gm. of Transexamic Acid, wait one hour then turn wound vac back on.  Order placed for txa. ?

## 2021-12-25 NOTE — H&P (Signed)
Adam Long is an 46 y.o. male.   ?Chief Complaint: Dehiscence left transtibial amputation. ?HPI: Patient is a 46 year old gentleman who presents status post revision left transtibial amputation.  Patient states he has progressive ulceration. ? ?Past Medical History:  ?Diagnosis Date  ? Anxiety   ? Aortic stenosis   ? Cellulitis and abscess of lower extremity 06/11/2019  ? Cellulitis of fourth toe of left foot   ? Cholelithiasis   ? Coronary artery disease   ? Nonobstructive CAD (40-50% LAD) 08/2019  ? Depression   ? Diabetes mellitus without complication ([place])   ? Phreesia 09/27/2020  ? Elevated troponin level not due myocardial infarction 11/11/2019  ? Essential hypertension   ? Gangrene of toe of left foot ([place]) 07/06/2019  ? Heart murmur   ? Phreesia 09/27/2020  ? Hyperlipidemia   ? Phreesia 09/27/2020  ? Hypertension   ? Phreesia 09/27/2020  ? Mixed hyperlipidemia   ? Morbid obesity ([place])   ? S/P aortic valve replacement with mechanical valve 12/05/2019  ? 25 mm Carbomedics top hat bileaflet mechanical valve via partial upper hemi-sternotomy  ? Severe aortic stenosis 09/24/2019  ? Type 2 diabetes mellitus ([place])   ? ? ?Past Surgical History:  ?Procedure Laterality Date  ? ABDOMINAL AORTOGRAM W/LOWER EXTREMITY N/A 07/06/2019  ? Procedure: ABDOMINAL AORTOGRAM W/LOWER EXTREMITY;  Surgeon: Elam Dutch, MD;  Location: [place] [facility];  Service: Cardiovascular;  Laterality: N/A;  Bilateral  ? AMPUTATION Left 07/09/2019  ? Procedure: LEFT FOURTH and Fifth TOE AMPUTATION.;  Surgeon: Rosetta Posner, MD;  Location: [place];  Service: Vascular;  Laterality: Left;  ? AMPUTATION Left 03/11/2021  ? Procedure: LEFT FOOT 5TH  AND 4TH RAY AMPUTATION;  Surgeon: Newt Minion, MD;  Location: [place];  Service: Orthopedics;  Laterality: Left;  ? AMPUTATION Left 08/28/2021  ? Procedure: AMPUTATION BELOW KNEE;  Surgeon: Newt Minion, MD;  Location: [place];  Service: Orthopedics;  Laterality: Left;  ? AORTIC VALVE REPLACEMENT  N/A 12/05/2019  ? Procedure: PARTIAL STERNOTOMY FOR AORTIC VALVE REPLACEMENT (AVR), USING CARBOMEDICS SUPRA-ANNULAR TOP HAT 25MM;  Surgeon: Rexene Alberts, MD;  Location: [place];  Service: Open Heart Surgery;  Laterality: N/A;  No neck lines on left  ? APPLICATION OF WOUND VAC Left 10/30/2021  ? Procedure: APPLICATION OF WOUND VAC;  Surgeon: Newt Minion, MD;  Location: [place];  Service: Orthopedics;  Laterality: Left;  ? CARDIAC VALVE REPLACEMENT N/A   ? Phreesia 09/27/2020  ? IR RADIOLOGY PERIPHERAL GUIDED IV START  10/05/2019  ? IR US GUIDE VASC ACCESS RIGHT  10/05/2019  ? MULTIPLE EXTRACTIONS WITH ALVEOLOPLASTY N/A 10/26/2019  ? Procedure: EXTRACTION OF TOOTH #'S 3, 5-11,19-28,  AND 32 WITH ALVEOLOPLASTY;  Surgeon: Lenn Cal, DDS;  Location: [place];  Service: Oral Surgery;  Laterality: N/A;  ? RIGHT HEART CATH AND CORONARY ANGIOGRAPHY N/A 09/24/2019  ? Procedure: RIGHT HEART CATH AND CORONARY ANGIOGRAPHY;  Surgeon: Sherren Mocha, MD;  Location: [place] [facility];  Service: Cardiovascular;  Laterality: N/A;  ? STUMP REVISION Left 10/30/2021  ? Procedure: REVISION LEFT BELOW KNEE AMPUTATION;  Surgeon: Newt Minion, MD;  Location: [place];  Service: Orthopedics;  Laterality: Left;  ? TEE WITHOUT CARDIOVERSION N/A 12/05/2019  ? Procedure: TRANSESOPHAGEAL ECHOCARDIOGRAM (TEE);  Surgeon: Rexene Alberts, MD;  Location: [place];  Service: Open Heart Surgery;  Laterality: N/A;  ? TEE WITHOUT CARDIOVERSION N/A 09/01/2021  ? Procedure: TRANSESOPHAGEAL ECHOCARDIOGRAM (TEE);  Surgeon: Gardiner Rhyme,  Doreatha Martin, MD;  Location: [facility];  Service: Cardiovascular;  Laterality: N/A;  ? ? ?Family History  ?Problem Relation Age of Onset  ? Cancer Mother   ?     Brain  ? Heart disease Father   ? Hyperlipidemia Father   ? Hypertension Father   ? Stroke Father   ? Heart murmur Sister   ? ?Social History:  reports that he has never smoked. He quit smokeless tobacco use about 2 years ago.  His smokeless tobacco use included chew. He  reports current alcohol use of about 4.0 standard drinks per week. He reports that he does not use drugs. ? ?Allergies:  ?Allergies  ?Allergen Reactions  ? Pollen Extract Other (See Comments)  ?  Runny nose, watery eyes, sneezing  ? ? ?No medications prior to admission.  ? ? ?Results for orders placed or performed in visit on 12/23/21 (from the past 48 hour(s))  ?POCT INR     Status: Abnormal  ? Collection Time: 12/23/21  3:04 PM  ?Result Value Ref Range  ? INR 5.0 (A) 2.0 - 3.0  ? ?No results found. ? ?Review of Systems  ?All other systems reviewed and are negative. ? ?There were no vitals taken for this visit. ?Physical Exam  ?Patient is alert, oriented, no adenopathy, well-dressed, normal affect, normal respiratory effort. ?Examination patient has 3 open wounds over the transtibial amputation there is no swelling no cellulitis there is clear drainage.  The 3 ulcers probe to bone. ?Assessment/Plan ?Assessment: Dehiscence of left transtibial amputation. ? ?Plan: Discussed with the patient that ideally an above-the-knee amputation is his best option.  Patient states he would like to proceed with a revision of the amputation in order to preserve his knee.  Risk and benefits were discussed including increased risk of the incision not healing recurrent dehiscence need for additional surgery.  Patient states he understands wished to proceed at this time. ? ?Newt Minion, MD ?12/25/2021, 6:44 AM ? ? ? ?

## 2021-12-25 NOTE — Interval H&P Note (Signed)
History and Physical Interval Note: ? ?12/25/2021 ?9:25 AM ? ?Adam Long  has presented today for surgery, with the diagnosis of Dehiscence Left Below Knee Amputation.  The various methods of treatment have been discussed with the patient and family. After consideration of risks, benefits and other options for treatment, the patient has consented to  Procedure(s): ?REVISION LEFT BELOW KNEE AMPUTATION (Left) as a surgical intervention.  The patient's history has been reviewed, patient examined, no change in status, stable for surgery.  I have reviewed the patient's chart and labs.  Questions were answered to the patient's satisfaction.   ? ? ?Nadara Mustard ? ? ?

## 2021-12-25 NOTE — Op Note (Signed)
12/25/2021 ? ?12:51 PM ? ?PATIENT:  Adam Long   ? ?PRE-OPERATIVE DIAGNOSIS:  Dehiscence Left Below Knee Amputation ? ?POST-OPERATIVE DIAGNOSIS:  Same ? ?PROCEDURE:  REVISION LEFT BELOW KNEE AMPUTATION, APPLICATION OF WOUND VAC ?Application 38 cm? Kerecis micro powder ? ?SURGEON:  Nadara Mustard, MD ? ?PHYSICIAN ASSISTANT:None ?ANESTHESIA:   General ? ?PREOPERATIVE INDICATIONS:  Adam Long is a  46 y.o. male with a diagnosis of Dehiscence Left Below Knee Amputation who failed conservative measures and elected for surgical management.   ? ?The risks benefits and alternatives were discussed with the patient preoperatively including but not limited to the risks of infection, bleeding, nerve injury, cardiopulmonary complications, the need for revision surgery, among others, and the patient was willing to proceed. ? ?OPERATIVE IMPLANTS: 38 cm? of Kerecis micro powder ? ?@ENCIMAGES @ ? ?OPERATIVE FINDINGS: Patient had a large deep area of necrotic tissue and abscess.  The tissue was sent for cultures. ? ?OPERATIVE PROCEDURE: Patient was brought the operating room and underwent a general anesthetic after a regional block.  After adequate levels anesthesia were obtained patient's left lower extremity was prepped using DuraPrep draped into a sterile field a timeout was called.  A fishmouth incision was made around the ulcerative necrotic tissue this was carried sharply down to bone.  The distal centimeter of tibia and fibula were resected.  The tibia was beveled anteriorly.  The area of the deep necrotic abscess was resected and sent as 1 block of tissue for cultures.  The tissue margins were clear no signs of infection there was good petechial bleeding.  Electrocautery was used hemostasis the wound was irrigated normal saline.  The wound was closed using 2-0 nylon with closure of the deep superficial fascial layer and skin.  The Praveena customizable and form wound VAC was then applied this had a good suction  fit the stump shrinker was applied patient was extubated taken the PACU in stable condition. ? ? ?DISCHARGE PLANNING: ? ?Antibiotic duration: Starting IV vancomycin and adjust according to cultures. ? ?Weightbearing: Nonweightbearing on the left ? ?Pain medication: Opioid pathway ? ?Dressing care/ Wound VAC: Continue wound VAC for 1 week at discharge ? ?Ambulatory devices: Walker ? ?Discharge to: Anticipate discharge to home. ? ?Follow-up: In the office 1 week post operative. ? ? ? ? ? ? ?  ?

## 2021-12-25 NOTE — Progress Notes (Signed)
ANTICOAGULATION CONSULT NOTE - Initial Consult ? ?Pharmacy Consult for warfarin ?Indication: mechanical aortic valve ? ?Allergies  ?Allergen Reactions  ? Pollen Extract Other (See Comments)  ?  Runny nose, watery eyes, sneezing  ? ? ?Patient Measurements: ?Height: 5\' 10"  (177.8 cm) ?Weight: (!) 156.5 kg (345 lb) ?IBW/kg (Calculated) : 73 ? ? ?Vital Signs: ?Temp: 98 ?F (36.7 ?C) (03/31 1245) ?Temp Source: Oral (03/31 [id number]) ?BP: 106/54 (03/31 1415) ?Pulse Rate: 102 (03/31 1415) ? ?Labs: ?Recent Labs  ?  12/23/21 ?1504 12/25/21 ?1001 12/25/21 ?1053  ?HGB  --  10.8* 11.9*  ?HCT  --  36.3* 35.0*  ?PLT  --  315  --   ?APTT  --  53*  --   ?LABPROT  --  30.6*  --   ?INR 5.0* 2.9*  --   ?CREATININE  --  0.82 0.60*  ? ? ?Estimated Creatinine Clearance: 175.5 mL/min (A) (by C-G formula based on SCr of 0.6 mg/dL (L)). ? ? ?Medical History: ?Past Medical History:  ?Diagnosis Date  ? Anxiety   ? Aortic stenosis   ? Cellulitis and abscess of lower extremity 06/11/2019  ? Cellulitis of fourth toe of left foot   ? Cholelithiasis   ? Coronary artery disease   ? Nonobstructive CAD (40-50% LAD) 08/2019  ? Depression   ? Diabetes mellitus without complication ([place])   ? Phreesia 09/27/2020  ? Elevated troponin level not due myocardial infarction 11/11/2019  ? Essential hypertension   ? Gangrene of toe of left foot ([place]) 07/06/2019  ? Heart murmur   ? Phreesia 09/27/2020  ? Hyperlipidemia   ? Phreesia 09/27/2020  ? Hypertension   ? Phreesia 09/27/2020  ? Mixed hyperlipidemia   ? Morbid obesity ([place])   ? S/P aortic valve replacement with mechanical valve 12/05/2019  ? 25 mm Carbomedics top hat bileaflet mechanical valve via partial upper hemi-sternotomy  ? Severe aortic stenosis 09/24/2019  ? Type 2 diabetes mellitus ([place])   ? ? ?Assessment: ?46 yo male with h/o mechanical aortic valve replacement on warfarin PTA. Patient [facility] in [facility] on 3/29 with INR 5 (patient on bactrim and keflex PTA). Patient instructed to hold warfarin on 3/29  and 3/30. Patient now post-op revision of left BKA and pharmacy consulted to restart warfarin today. INR 2.9 this AM. Patient was to decrease dose to 10mg  daily except 5mg  on MWF starting today. Will resume today at 5mg  x 1, daily INR ? ? ?Goal of Therapy:  ?INR 2-3 ?Monitor platelets by anticoagulation protocol: Yes ?  ?Plan:  ?Warfarin 5mg  Po x 1 ?Daily INR ?Monitor for post-op bleeding ? ?Hewitt Garner A. Levada Dy, PharmD, BCPS, FNKF ?Clinical Pharmacist ?[place] ?Please utilize Amion for appropriate phone number to reach the unit pharmacist ([place]) ? ?12/25/2021,2:58 PM ? ? ?

## 2021-12-25 NOTE — Transfer of Care (Signed)
Immediate Anesthesia Transfer of Care Note ? ?Patient: Adam Long ? ?Procedure(s) Performed: REVISION LEFT BELOW KNEE AMPUTATION (Left: Leg Lower) ?APPLICATION OF WOUND VAC (Left: Knee) ? ?Patient Location: PACU ? ?Anesthesia Type:General and Regional ? ?Level of Consciousness: awake, alert  and oriented ? ?Airway & Oxygen Therapy: Patient Spontanous Breathing and Patient connected to face mask oxygen ? ?Post-op Assessment: Report given to RN and Post -op Vital signs reviewed and stable ? ?Post vital signs: Reviewed and stable ? ?Last Vitals:  ?Vitals Value Taken Time  ?BP 122/55 12/25/21 1244  ?Temp    ?Pulse 103 12/25/21 1245  ?Resp 31 12/25/21 1245  ?SpO2 95 % 12/25/21 1245  ?Vitals shown include unvalidated device data. ? ?Last Pain:  ?Vitals:  ? 12/25/21 0846  ?TempSrc: Oral  ?   ? ?  ? ?Complications: No notable events documented. ?

## 2021-12-26 LAB — PROTIME-INR
INR: 1.9 — ABNORMAL HIGH (ref 0.8–1.2)
Prothrombin Time: 22 seconds — ABNORMAL HIGH (ref 11.4–15.2)

## 2021-12-26 LAB — CBC
HCT: 26.3 % — ABNORMAL LOW (ref 39.0–52.0)
Hemoglobin: 8.2 g/dL — ABNORMAL LOW (ref 13.0–17.0)
MCH: 23.6 pg — ABNORMAL LOW (ref 26.0–34.0)
MCHC: 31.2 g/dL (ref 30.0–36.0)
MCV: 75.6 fL — ABNORMAL LOW (ref 80.0–100.0)
Platelets: 270 10*3/uL (ref 150–400)
RBC: 3.48 MIL/uL — ABNORMAL LOW (ref 4.22–5.81)
RDW: 16 % — ABNORMAL HIGH (ref 11.5–15.5)
WBC: 11.9 10*3/uL — ABNORMAL HIGH (ref 4.0–10.5)
nRBC: 0 % (ref 0.0–0.2)

## 2021-12-26 LAB — GLUCOSE, CAPILLARY
Glucose-Capillary: 164 mg/dL — ABNORMAL HIGH (ref 70–99)
Glucose-Capillary: 191 mg/dL — ABNORMAL HIGH (ref 70–99)
Glucose-Capillary: 157 mg/dL — ABNORMAL HIGH (ref 70–99)
Glucose-Capillary: 168 mg/dL — ABNORMAL HIGH (ref 70–99)

## 2021-12-26 MED ORDER — SODIUM CHLORIDE 0.9 % IV SOLN
2.0000 g | INTRAVENOUS | Status: DC
  Administered 2021-12-27: 2 g via INTRAVENOUS
  Filled 2021-12-26: qty 20

## 2021-12-26 MED ORDER — SODIUM CHLORIDE 0.9 % IV SOLN
200.0000 mg | Freq: Every day | INTRAVENOUS | Status: DC
  Administered 2021-12-26 – 2021-12-28 (×3): 200 mg via INTRAVENOUS
  Filled 2021-12-26 (×2): qty 10
  Filled 2021-12-26: qty 20

## 2021-12-26 MED ORDER — WARFARIN SODIUM 5 MG PO TABS
10.0000 mg | ORAL_TABLET | Freq: Once | ORAL | Status: AC
  Administered 2021-12-26: 10 mg via ORAL
  Filled 2021-12-26: qty 2

## 2021-12-26 MED ORDER — VANCOMYCIN HCL 1250 MG/250ML IV SOLN
1250.0000 mg | Freq: Three times a day (TID) | INTRAVENOUS | Status: DC
  Administered 2021-12-26 – 2021-12-27 (×3): 1250 mg via INTRAVENOUS
  Filled 2021-12-26 (×4): qty 250

## 2021-12-26 NOTE — Evaluation (Signed)
Occupational Therapy Evaluation ?Patient Details ?Name: Adam Long ?MRN: [id number] ?DOB: 1976/03/21 ?Today's Date: 12/26/2021 ? ? ?History of Present Illness The pt is a 46 yo male presenting 3/31 for L BKA revision. PMH includes: anxity, aortic stenosis, CAD, depression, DM II, HTN, HLD, morbid obesity, AVR, amputations of L toes -> L BKA in 12/22 and previous revision on 2/3.  ? ?Clinical Impression ?  ?Pt admitted for L BKA revision. Pt currently with functional limitations due to the deficits listed below (see OT Problem List).  Pt will benefit from skilled OT to increase their safety and independence with ADL and functional mobility for ADL to facilitate discharge to venue listed below.   ?   ? ?Recommendations for follow up therapy are one component of a multi-disciplinary discharge planning process, led by the attending physician.  Recommendations may be updated based on patient status, additional functional criteria and insurance authorization.  ? ?Follow Up Recommendations ? Home health OT  ?  ?Assistance Recommended at Discharge PRN  ?Patient can return home with the following Assistance with cooking/housework ? ?  ?Functional Status Assessment ? Patient has had a recent decline in their functional status and demonstrates the ability to make significant improvements in function in a reasonable and predictable amount of time.  ?Equipment Recommendations ? None recommended by OT  ?  ?Recommendations for Other Services   ? ? ?  ?Precautions / Restrictions Precautions ?Precautions: Fall ?Precaution Comments: L BKA with wound vac ?Restrictions ?Weight Bearing Restrictions: Yes ?LLE Weight Bearing: Non weight bearing  ? ?  ? ?Mobility Bed Mobility ?Overal bed mobility: Modified Independent ?  ?  ?  ?  ?  ?  ?General bed mobility comments: use of bed rail, but no assist ?  ? ?Transfers ?Overall transfer level: Needs assistance ?Equipment used: Rolling walker (2 wheels) ?Transfers: Sit to/from Stand ?Sit to  Stand: Mod assist ?  ?  ?Step pivot transfers: Min guard ?  ?  ?General transfer comment: pt able to power up to standing with modA, slow to rise with good stability ?  ? ?  ?Balance Overall balance assessment: Needs assistance ?Sitting-balance support: No upper extremity supported, Feet supported ?Sitting balance-Leahy Scale: Good ?  ?  ?Standing balance support: Bilateral upper extremity supported, Reliant on assistive device for balance ?Standing balance-Leahy Scale: Fair ?Standing balance comment: stable with BUE support on RW. ?  ?  ?  ?  ?  ?  ?  ?  ?  ?  ?  ?   ? ?ADL either performed or assessed with clinical judgement  ? ?ADL Overall ADL's : Needs assistance/impaired ?Eating/Feeding: Set up;Sitting ?  ?Grooming: Set up;Sitting ?  ?Upper Body Bathing: Set up;Sitting ?  ?Lower Body Bathing: Moderate assistance;Sitting/lateral leans ?  ?Upper Body Dressing : Set up;Sitting ?  ?Lower Body Dressing: Sitting/lateral leans;Moderate assistance ?  ?  ?  ?  ?  ?  ?  ?  ?   ? ? ? ? ?Pertinent Vitals/Pain Pain Assessment ?Pain Score: 2  ?Pain Location: incision site ?Pain Descriptors / Indicators: Discomfort ?Pain Intervention(s): Limited activity within patient's tolerance  ? ? ? ?Hand Dominance Right ?  ?Extremity/Trunk Assessment Upper Extremity Assessment ?Upper Extremity Assessment: Overall WFL for tasks assessed ?  ?  ?  ?  ?  ?Communication Communication ?Communication: No difficulties ?  ?Cognition Arousal/Alertness: Awake/alert ?Behavior During Therapy: [facility] for tasks assessed/performed ?Overall Cognitive Status: Within Functional Limits for tasks assessed ?  ?  ?  ?  ?  ?  ?  ?  ?  ?  ?  ?  ?  ?  ?  ?  ?  ?  ?  ?   ?   ?   ? ? ?  Home Living Family/patient expects to be discharged to:: Private residence ?Living Arrangements: Non-relatives/Friends ?Available Help at Discharge: Friend(s);Family;Available PRN/intermittently ?Type of Home: House ?Home Access: Stairs to enter ?Entrance Stairs-Number of Steps: 2 low  steps at back door ?Entrance Stairs-Rails: None ?Home Layout: Two level;Able to live on main level with bedroom/bathroom ?  ?  ?Bathroom Shower/Tub: Tub/shower unit;Sponge bathes at baseline ?  ?Bathroom Toilet: Handicapped height ?Bathroom Accessibility: Yes (with RW) ?  ?Home Equipment: Conservation officer, nature (2 wheels);Wheelchair - manual;BSC/3in1 (knee scooter) ?  ?Additional Comments: has been sponge bathing ?  ? ?  ?Prior Functioning/Environment Prior Level of Function : Independent/Modified Independent;Working/employed;Driving ?  ?  ?  ?  ?  ?  ?Mobility Comments: has gotten back to cooking from [facility], does walk with RW at times but can also use WC. complete steps by himself ?ADLs Comments: pt reports independent with increased time or WC. sponge bathes since last admission ?  ? ?  ?  ?OT Problem List: Decreased strength;Impaired balance (sitting and/or standing);Obesity ?  ?   ?OT Treatment/Interventions: Self-care/ADL training;Patient/family education  ?  ?OT Goals(Current goals can be found in the care plan section) Acute Rehab OT Goals ?Patient Stated Goal: this wound get better! ?OT Goal Formulation: With patient ?Time For Goal Achievement: 01/09/22 ?Potential to Achieve Goals: Good  ?OT Frequency: Min 2X/week ?  ? ?   ?AM-PAC OT "6 Clicks" Daily Activity     ?Outcome Measure Help from another person eating meals?: None ?Help from another person taking care of personal grooming?: None ?Help from another person toileting, which includes using toliet, bedpan, or urinal?: A Little ?Help from another person bathing (including washing, rinsing, drying)?: A Little ?Help from another person to put on and taking off regular upper body clothing?: None ?Help from another person to put on and taking off regular lower body clothing?: A Little ?6 Click Score: 21 ?  ?End of Session Equipment Utilized During Treatment: Rolling walker (2 wheels) ?Nurse Communication: Mobility status ? ?Activity Tolerance: Patient tolerated  treatment well ?Patient left: in chair;with call bell/phone within reach ? ?OT Visit Diagnosis: Unsteadiness on feet (R26.81);Other abnormalities of gait and mobility (R26.89)  ?              ?Time: [id number] ?OT Time Calculation (min): 19 min ?Charges:  OT General Charges ?$OT Visit: 1 Visit ?OT Evaluation ?$OT Eval Low Complexity: 1 Low ? ?Kari Baars, OT ?Acute Rehabilitation Services ?[phone] ?Office- [phone] ? ?  ? ?Payton Mccallum D ?12/26/2021, 3:41 PM ?

## 2021-12-26 NOTE — Evaluation (Signed)
Physical Therapy Evaluation ?Patient Details ?Name: Adam Long ?MRN: [id number] ?DOB: 09/26/1976 ?Today's Date: 12/26/2021 ? ?History of Present Illness ? The pt is a 46 yo male presenting 3/31 for L BKA revision. PMH includes: anxity, aortic stenosis, CAD, depression, DM II, HTN, HLD, morbid obesity, AVR, amputations of L toes -> L BKA in 12/22 and previous revision on 2/3. ?  ?Clinical Impression ? Pt in bed upon arrival of PT, agreeable to evaluation at this time. Prior to admission the pt was mobilizing with use of RW or WC in the home, taking sponge baths, but reports generally had returned to modI level with mobility after last BKA revision. The pt was able to complete bed mobility and transfer to recliner without assist, but did need modA to power up from EOB. The pt states he uses elevated surfaces at home, and does have assist from family if needed. Will continue to follow acutely to progress mobility and activity tolerance, but the pt is safe to return home with family support and without any new DME at this time.    ?   ? ?Recommendations for follow up therapy are one component of a multi-disciplinary discharge planning process, led by the attending physician.  Recommendations may be updated based on patient status, additional functional criteria and insurance authorization. ? ?Follow Up Recommendations No PT follow up ? ?  ?Assistance Recommended at Discharge Intermittent Supervision/Assistance  ?Patient can return home with the following ? A little help with walking and/or transfers;A little help with bathing/dressing/bathroom;Help with stairs or ramp for entrance ? ?  ?Equipment Recommendations None recommended by PT  ?Recommendations for Other Services ?    ?  ?Functional Status Assessment Patient has had a recent decline in their functional status and demonstrates the ability to make significant improvements in function in a reasonable and predictable amount of time.  ? ?  ?Precautions /  Restrictions Precautions ?Precautions: Fall ?Precaution Comments: L BKA with wound vac ?Restrictions ?Weight Bearing Restrictions: Yes ?LLE Weight Bearing: Non weight bearing  ? ?  ? ?Mobility ? Bed Mobility ?Overal bed mobility: Modified Independent ?  ?  ?  ?  ?  ?  ?General bed mobility comments: use of bed rail, but no assist ?  ? ?Transfers ?Overall transfer level: Needs assistance ?Equipment used: Rolling walker (2 wheels) ?Transfers: Sit to/from Stand ?Sit to Stand: Mod assist ?  ?Step pivot transfers: Min guard ?  ?  ?  ?General transfer comment: pt able to power up to standing with modA, slow to rise with good stability. minG while pt hopping to chair ?  ? ?Ambulation/Gait ?Ambulation/Gait assistance: Min guard ?Gait Distance (Feet): 5 Feet ?Assistive device: Rolling walker (2 wheels) ?  ?Gait velocity: decreased ?Gait velocity interpretation: <1.31 ft/sec, indicative of household ambulator ?  ?General Gait Details: pt hopping with use of RW and RLE, good stability and slow/controlled speed. ? ?  ? ?Balance Overall balance assessment: Needs assistance ?Sitting-balance support: No upper extremity supported, Feet supported ?Sitting balance-Leahy Scale: Good ?  ?  ?Standing balance support: Bilateral upper extremity supported, Reliant on assistive device for balance ?Standing balance-Leahy Scale: Fair ?Standing balance comment: stable with BUE support on RW. ?  ?  ?  ?  ?  ?  ?  ?  ?  ?  ?  ?   ? ? ? ?Pertinent Vitals/Pain Pain Assessment ?Pain Assessment: 0-10 ?Pain Score: 2  ?Pain Location: incision site ?Pain Descriptors / Indicators: Discomfort ?Pain Intervention(s): Monitored  during session, Limited activity within patient's tolerance, Premedicated before session  ? ? ?Home Living Family/patient expects to be discharged to:: Private residence ?Living Arrangements: Non-relatives/Friends ?Available Help at Discharge: Friend(s);Family;Available PRN/intermittently ?Type of Home: House ?Home Access: Stairs to  enter ?Entrance Stairs-Rails: None ?Entrance Stairs-Number of Steps: 2 low steps at back door ?  ?Home Layout: Two level;Able to live on main level with bedroom/bathroom ?Home Equipment: Conservation officer, nature (2 wheels);Wheelchair - manual;BSC/3in1 (knee scooter) ?Additional Comments: has been sponge bathing  ?  ?Prior Function Prior Level of Function : Independent/Modified Independent;Working/employed;Driving ?  ?  ?  ?  ?  ?  ?Mobility Comments: has gotten back to cooking from [facility], does walk with RW at times but can also use WC. complete steps by himself ?ADLs Comments: pt reports independent with increased time or WC. sponge bathes since last admission ?  ? ? ?Hand Dominance  ? Dominant Hand: Right ? ?  ?Extremity/Trunk Assessment  ? Upper Extremity Assessment ?Upper Extremity Assessment: Overall WFL for tasks assessed ?  ? ?Lower Extremity Assessment ?Lower Extremity Assessment: LLE deficits/detail ?LLE Deficits / Details: L BKA, able to demo good activation of quad and HS. reports sensation intact ?LLE: Unable to fully assess due to pain ?LLE Sensation: WNL ?LLE Coordination: WNL ?  ? ?Cervical / Trunk Assessment ?Cervical / Trunk Assessment: Normal  ?Communication  ? Communication: No difficulties  ?Cognition Arousal/Alertness: Awake/alert ?Behavior During Therapy: [facility] for tasks assessed/performed ?Overall Cognitive Status: Within Functional Limits for tasks assessed ?  ?  ?  ?  ?  ?  ?  ?  ?  ?  ?  ?  ?  ?  ?  ?  ?  ?  ?  ? ?  ?General Comments General comments (skin integrity, edema, etc.): VSS on RA ? ?  ?   ? ?Assessment/Plan  ?  ?PT Assessment Patient needs continued PT services  ?PT Problem List Decreased activity tolerance;Decreased balance;Decreased mobility;Decreased coordination ? ?   ?  ?PT Treatment Interventions DME instruction;Gait training;Stair training;Functional mobility training;Therapeutic activities;Therapeutic exercise;Balance training;Patient/family education   ? ?PT Goals (Current goals can be  found in the Care Plan section)  ?Acute Rehab PT Goals ?Patient Stated Goal: to return home ?PT Goal Formulation: With patient ?Time For Goal Achievement: 01/09/22 ?Potential to Achieve Goals: Good ? ?  ?Frequency Min 3X/week ?  ? ? ?   ?AM-PAC PT "6 Clicks" Mobility  ?Outcome Measure Help needed turning from your back to your side while in a flat bed without using bedrails?: A Little ?Help needed moving from lying on your back to sitting on the side of a flat bed without using bedrails?: A Little ?Help needed moving to and from a bed to a chair (including a wheelchair)?: A Little ?Help needed standing up from a chair using your arms (e.g., wheelchair or bedside chair)?: A Lot ?Help needed to walk in hospital room?: A Little ?Help needed climbing 3-5 steps with a railing? : A Little ?6 Click Score: 17 ? ?  ?End of Session Equipment Utilized During Treatment: Gait belt ?Activity Tolerance: Patient tolerated treatment well;Patient limited by lethargy ?Patient left: in chair;with call bell/phone within reach;with chair alarm set ?Nurse Communication: Mobility status ?PT Visit Diagnosis: Other abnormalities of gait and mobility (R26.89) ?  ? ?Time: [id number] ?PT Time Calculation (min) (ACUTE ONLY): 25 min ? ? ?Charges:   PT Evaluation ?$PT Eval Low Complexity: 1 Low ?PT Treatments ?$Therapeutic Exercise: 8-22 mins ?  ?   ? ? ?  West Carbo, PT, DPT  ? ?Acute Rehabilitation Department ?Pager #: [phone] - [phone] ? ?Sandra Cockayne ?12/26/2021, 10:33 AM ? ?

## 2021-12-26 NOTE — Progress Notes (Signed)
Pharmacy Antibiotic Note ? ?INDIE Long is a 46 y.o. male admitted on 12/25/2021 with a wound infection s/p revision of Left BKA on 3/31.  Pharmacy has been consulted for vancomycin dosing. ? ?Plan: ?Vancomycin 1750mg  IV q8h>>1250 Q8H (eAUC 459, Scr used 0.8 (rounded up), Vd 0.5)  ?START ceftriaxone 2g IV Q24H  ?START Micafungin 200 mg IV Q24H (obesity dosing)  ?Monitor for deescalation and renal function ? ?Height: 5\' 10"  (177.8 cm) ?Weight: (!) 156.5 kg (345 lb) ?IBW/kg (Calculated) : 73 ? ?Temp (24hrs), Avg:98.8 ?F (37.1 ?C), Min:98 ?F (36.7 ?C), Max:99.6 ?F (37.6 ?C) ? ?Recent Labs  ?Lab 12/25/21 ?1001 12/25/21 ?1053 12/26/21 ?0226  ?WBC 10.4  --  11.9*  ?CREATININE 0.82 0.60*  --   ? ?  ?Estimated Creatinine Clearance: 175.5 mL/min (A) (by C-G formula based on SCr of 0.6 mg/dL (L)).   ? ?Allergies  ?Allergen Reactions  ? Pollen Extract Other (See Comments)  ?  Runny nose, watery eyes, sneezing  ? ? ?Antimicrobials this admission: ?Vancomycin 3/31>> ?Ceftriaxone 4/1>> ?Micafungin 4/1>> ? ?Dose adjustments this admission: ?Vancomycin 1750mg  IV q8h>>1250 Q8H (eAUC 459, Scr used 0.8 (rounded up), Vd 0.5)  ? ?Microbiology results: ?Previous wound culture proteus, e.coli and enterococcus ?3/31 abscess cx: moderate GPCs, Few GNRs, few hyphae  ? ?4/31, PharmD ?PGY-1 Acute Care Resident  ?12/26/2021 10:08 AM  ? ? ?

## 2021-12-26 NOTE — Progress Notes (Addendum)
Patient ID: Adam Long, male   DOB: July 30, 1976, 46 y.o.   MRN: [id number] ?Patient is postoperative day 1 revision left transtibial amputation.  Cultures are showing gram-negative rods and gram-positive cocci he is currently on vancomycin.  His INR this morning was 1.9.  There is 100 cc in the wound VAC canister.  Will add ceftriaxone previous cultures for E. coli and Proteus were sensitive to this.  Micafungin 200 mg IV every 24 per pharmacy recommendation for the positive fungal cultures ?

## 2021-12-26 NOTE — Progress Notes (Signed)
Inpatient Rehab Admissions Coordinator:  ?Consult received. Note PT is not recommending any PT follow up and OT is recommending HH therapy. CIR intensity therapy is not warranted at this time. AC will sign off. ? ? ?Wolfgang Phoenix, MS, CCC-SLP ?Admissions Coordinator ?[phone] ? ?

## 2021-12-26 NOTE — Progress Notes (Signed)
ANTICOAGULATION CONSULT NOTE  ? ?Pharmacy Consult for warfarin ?Indication: mechanical aortic valve ? ?Allergies  ?Allergen Reactions  ? Pollen Extract Other (See Comments)  ?  Runny nose, watery eyes, sneezing  ? ? ?Patient Measurements: ?Height: 5\' 10"  (177.8 cm) ?Weight: (!) 156.5 kg (345 lb) ?IBW/kg (Calculated) : 73 ? ? ?Vital Signs: ?Temp: 98.9 ?F (37.2 ?C) (04/01 06-27-1994) ?Temp Source: Oral (04/01 06-27-1994) ?BP: 107/53 (04/01 06-27-1994) ?Pulse Rate: 107 (04/01 0837) ? ?Labs: ?Recent Labs  ?  12/23/21 ?1504 12/25/21 ?1001 12/25/21 ?1001 12/25/21 ?1053 12/26/21 ?0226  ?HGB  --  10.8*   < > 11.9* 8.2*  ?HCT  --  36.3*  --  35.0* 26.3*  ?PLT  --  315  --   --  270  ?APTT  --  53*  --   --   --   ?LABPROT  --  30.6*  --   --  22.0*  ?INR 5.0* 2.9*  --   --  1.9*  ?CREATININE  --  0.82  --  0.60*  --   ? < > = values in this interval not displayed.  ? ? ? ?Estimated Creatinine Clearance: 175.5 mL/min (A) (by C-G formula based on SCr of 0.6 mg/dL (L)). ? ? ?Medical History: ?Past Medical History:  ?Diagnosis Date  ? Anxiety   ? Aortic stenosis   ? Cellulitis and abscess of lower extremity 06/11/2019  ? Cellulitis of fourth toe of left foot   ? Cholelithiasis   ? Coronary artery disease   ? Nonobstructive CAD (40-50% LAD) 08/2019  ? Depression   ? Diabetes mellitus without complication (HCC)   ? Phreesia 09/27/2020  ? Elevated troponin level not due myocardial infarction 11/11/2019  ? Essential hypertension   ? Gangrene of toe of left foot (HCC) 07/06/2019  ? Heart murmur   ? Phreesia 09/27/2020  ? Hyperlipidemia   ? Phreesia 09/27/2020  ? Hypertension   ? Phreesia 09/27/2020  ? Mixed hyperlipidemia   ? Morbid obesity (HCC)   ? S/P aortic valve replacement with mechanical valve 12/05/2019  ? 25 mm Carbomedics top hat bileaflet mechanical valve via partial upper hemi-sternotomy  ? Severe aortic stenosis 09/24/2019  ? Type 2 diabetes mellitus (HCC)   ? ? ?Assessment: ?46 yo male with h/o mechanical aortic valve replacement on  warfarin PTA. Patient seen in [facility] on 3/29 with INR 5 (patient on bactrim and keflex PTA). Patient instructed to hold warfarin on 3/29 and 3/30. Patient now post-op revision of left BKA and pharmacy consulted to restart warfarin. INR 1.9 4/1 AM s/p a single 5 mg warfarin dose.  ? ?Hgb 11.9>8.2, PLT 315>270  s/p dehiscence of L BKA. No signs of bleeding noted per RN.  ? ?PTA: Patient was to decrease dose to 10mg  daily except 5mg  on MWF  ? ? ?Goal of Therapy:  ?INR 2-3 ?Monitor platelets by anticoagulation protocol: Yes ?  ?Plan:  ?Warfarin 10 mg PO x1  ?Daily INR ?Monitor for s/sx of bleeding  ? ?4/30, PharmD ?PGY-1 Acute Care Resident  ?12/26/2021 10:03 AM  ? ? ? ?

## 2021-12-27 LAB — PROTIME-INR
INR: 1.7 — ABNORMAL HIGH (ref 0.8–1.2)
Prothrombin Time: 19.9 seconds — ABNORMAL HIGH (ref 11.4–15.2)

## 2021-12-27 LAB — CBC
HCT: 24.8 % — ABNORMAL LOW (ref 39.0–52.0)
Hemoglobin: 7.7 g/dL — ABNORMAL LOW (ref 13.0–17.0)
MCH: 23.3 pg — ABNORMAL LOW (ref 26.0–34.0)
MCHC: 31 g/dL (ref 30.0–36.0)
MCV: 74.9 fL — ABNORMAL LOW (ref 80.0–100.0)
Platelets: 230 10*3/uL (ref 150–400)
RBC: 3.31 MIL/uL — ABNORMAL LOW (ref 4.22–5.81)
RDW: 16 % — ABNORMAL HIGH (ref 11.5–15.5)
WBC: 11.3 10*3/uL — ABNORMAL HIGH (ref 4.0–10.5)
nRBC: 0 % (ref 0.0–0.2)

## 2021-12-27 LAB — BASIC METABOLIC PANEL
Anion gap: 11 (ref 5–15)
BUN: 15 mg/dL (ref 6–20)
CO2: 23 mmol/L (ref 22–32)
Calcium: 8.9 mg/dL (ref 8.9–10.3)
Chloride: 101 mmol/L (ref 98–111)
Creatinine, Ser: 0.79 mg/dL (ref 0.61–1.24)
GFR, Estimated: >60 mL/min (ref >60)
Glucose, Bld: 146 mg/dL — ABNORMAL HIGH (ref 70–99)
Potassium: 4.2 mmol/L (ref 3.5–5.1)
Sodium: 135 mmol/L (ref 135–145)

## 2021-12-27 LAB — GLUCOSE, CAPILLARY
Glucose-Capillary: 145 mg/dL — ABNORMAL HIGH (ref 70–99)
Glucose-Capillary: 158 mg/dL — ABNORMAL HIGH (ref 70–99)
Glucose-Capillary: 190 mg/dL — ABNORMAL HIGH (ref 70–99)
Glucose-Capillary: 153 mg/dL — ABNORMAL HIGH (ref 70–99)

## 2021-12-27 MED ORDER — IMIPENEM-CILASTATIN 500 MG IV SOLR
1250.0000 mg | Freq: Four times a day (QID) | INTRAVENOUS | Status: DC
  Filled 2021-12-27 (×3): qty 25

## 2021-12-27 MED ORDER — WARFARIN SODIUM 5 MG PO TABS
10.0000 mg | ORAL_TABLET | Freq: Once | ORAL | Status: AC
  Administered 2021-12-27: 10 mg via ORAL
  Filled 2021-12-27: qty 2

## 2021-12-27 MED ORDER — SODIUM CHLORIDE 0.9 % IV SOLN
500.0000 mg | Freq: Four times a day (QID) | INTRAVENOUS | Status: DC
  Administered 2021-12-28 (×2): 500 mg via INTRAVENOUS
  Filled 2021-12-27 (×4): qty 10

## 2021-12-27 NOTE — Progress Notes (Signed)
Patient ID: Adam Long, male   DOB: [birth year], 46 y.o.   MRN: [id number] ?Patient is status post revision left transtibial amputation.  There is 200 cc in the wound VAC canister.  Patient's sensitivities are pending.  Discussed patient may need a PICC line for discharge.  Patient states he sat up in a chair yesterday. ?

## 2021-12-27 NOTE — Plan of Care (Signed)

## 2021-12-27 NOTE — Progress Notes (Signed)
ANTICOAGULATION CONSULT NOTE  ? ?Pharmacy Consult for warfarin ?Indication: mechanical aortic valve ? ?Allergies  ?Allergen Reactions  ? Pollen Extract Other (See Comments)  ?  Runny nose, watery eyes, sneezing  ? ? ?Patient Measurements: ?Height: 5\' 10"  (177.8 cm) ?Weight: (!) 156.5 kg (345 lb) ?IBW/kg (Calculated) : 73 ? ? ?Vital Signs: ?Temp: 98.1 ?F (36.7 ?C) (04/02 [id number]) ?Temp Source: Oral (04/02 0343) ?BP: 143/56 (04/02 0804) ?Pulse Rate: 86 (04/02 0804) ? ?Labs: ?Recent Labs  ?  12/25/21 ?1001 12/25/21 ?1053 12/26/21 ?0226 12/27/21 ?0208  ?HGB 10.8* 11.9* 8.2* 7.7*  ?HCT 36.3* 35.0* 26.3* 24.8*  ?PLT 315  --  270 230  ?APTT 53*  --   --   --   ?LABPROT 30.6*  --  22.0* 19.9*  ?INR 2.9*  --  1.9* 1.7*  ?CREATININE 0.82 0.60*  --  0.79  ? ? ? ?Estimated Creatinine Clearance: 175.5 mL/min (by C-G formula based on SCr of 0.79 mg/dL). ? ? ?Medical History: ?Past Medical History:  ?Diagnosis Date  ? Anxiety   ? Aortic stenosis   ? Cellulitis and abscess of lower extremity 06/11/2019  ? Cellulitis of fourth toe of left foot   ? Cholelithiasis   ? Coronary artery disease   ? Nonobstructive CAD (40-50% LAD) 08/2019  ? Depression   ? Diabetes mellitus without complication ([place])   ? Phreesia 09/27/2020  ? Elevated troponin level not due myocardial infarction 11/11/2019  ? Essential hypertension   ? Gangrene of toe of left foot ([place]) 07/06/2019  ? Heart murmur   ? Phreesia 09/27/2020  ? Hyperlipidemia   ? Phreesia 09/27/2020  ? Hypertension   ? Phreesia 09/27/2020  ? Mixed hyperlipidemia   ? Morbid obesity ([place])   ? S/P aortic valve replacement with mechanical valve 12/05/2019  ? 25 mm Carbomedics top hat bileaflet mechanical valve via partial upper hemi-sternotomy  ? Severe aortic stenosis 09/24/2019  ? Type 2 diabetes mellitus ([place])   ? ? ?Assessment: ?46 yo male with h/o mechanical aortic valve replacement on warfarin PTA. Patient [facility] on 3/29 with INR 5 (patient on bactrim and keflex PTA). Patient  instructed to hold warfarin on 3/29 and 3/30. Patient now post-op revision of left BKA and pharmacy consulted to restart warfarin. INR 1.9 4/1 AM s/p a single 5 mg warfarin dose.  ? ?Hgb 11.9>8.2>7.7, PLT 315>270>230  s/p dehiscence of L BKA. No signs of bleeding noted per RN.  ? ?PTA: Patient was to decrease dose to 10mg  daily except 5mg  on MWF  ? ? ?Goal of Therapy:  ?INR 2-3 ?Monitor platelets by anticoagulation protocol: Yes ?  ?Plan:  ?Warfarin 10 mg PO x1  ?Daily INR ?Monitor for s/sx of bleeding  ? ?Adria Dill, PharmD ?PGY-1 Acute Care Resident  ?12/27/2021 8:24 AM  ? ? ? ?

## 2021-12-27 NOTE — TOC Initial Note (Signed)
Transition of Care (TOC) - Initial/Assessment Note  ? ? ?Patient Details  ?Name: Adam Long ?MRN: [id number] ?Date of Birth: [birth year] ? ?Transition of Care (TOC) CM/SW Contact:    ?Bess Kinds, RN ?Phone Number: [phone] ?12/27/2021, 4:51 PM ? ?Clinical Narrative:                 ? ?Following patient for potential HH needs to include RN, PT, OT. Noted cultures pending and may require IV antibiotics long term.  ? ?Referral to [facility] at [facility] accepted for RN for IV antibiotics if needed and for therapy PT,OT.  ? ?Referral to Pam at [facility] for IV antibiotics if needed - Pam will follow ? ?Paitent stated that he will have his aunt pick him up at discharge. He typically is able to drive himself.  ? ?He has needed DME to include walker and wheechair.  ? ?TOC following for transition needs.  ? ?Expected Discharge Plan: Home w Home Health Services ?Barriers to Discharge: Continued Medical Work up ? ? ?Patient Goals and CMS Choice ?Patient states their goals for this hospitalization and ongoing recovery are:: return home ?CMS Medicare.gov Compare Post Acute Care list provided to:: Patient ?Choice offered to / list presented to : Patient ? ?Expected Discharge Plan and Services ?Expected Discharge Plan: Home w Home Health Services ?  ?Discharge Planning Services: CM Consult ?Post Acute Care Choice: Home Health ?Living arrangements for the past 2 months: Single Family Home ?                ?  ?  ?  ?  ?  ?HH Arranged: RN, PT, OT ?HH Agency: [employer], [employer], [facility] ([facility] ?Date HH Agency Contacted: 12/27/21 ?Time HH Agency Contacted: 1650 ?Representative spoke with at [facility] Agency: Barbara Cower ([facility]) and Elita Quick ([facility]) ? ?Prior Living Arrangements/Services ?Living arrangements for the past 2 months: Single Family Home ?Lives with:: Self ?Patient language and need for interpreter reviewed:: Yes ?       ?Need for Family Participation in Patient Care: No (Comment) ?  ?Current home services: DME (RW,  WC) ?Criminal Activity/Legal Involvement Pertinent to Current Situation/Hospitalization: No - Comment as needed ? ?Activities of Daily Living ?Home Assistive Devices/Equipment: Walker (specify type), Wheelchair, CBG Meter, Blood pressure cuff, Eyeglasses, Shower chair without back, Bedside commode/3-in-1 ?ADL Screening (condition at time of admission) ?Patient's cognitive ability adequate to safely complete daily activities?: Yes ?Is the patient deaf or have difficulty hearing?: No ?Does the patient have difficulty seeing, even when wearing glasses/contacts?: No ?Does the patient have difficulty concentrating, remembering, or making decisions?: No ?Patient able to express need for assistance with ADLs?: Yes ?Does the patient have difficulty dressing or bathing?: No ?Independently performs ADLs?: Yes (appropriate for developmental age) ?Does the patient have difficulty walking or climbing stairs?: Yes ?Weakness of Legs: None ?Weakness of Arms/Hands: None ? ?Permission Sought/Granted ?  ?  ?   ?   ?   ?   ? ?Emotional Assessment ?Appearance:: Appears stated age ?Attitude/Demeanor/Rapport: Engaged ?Affect (typically observed): Accepting ?Orientation: : Oriented to Self, Oriented to Place, Oriented to  Time, Oriented to Situation ?Alcohol / Substance Use: Not Applicable ?Psych Involvement: No (comment) ? ?Admission diagnosis:  Below-knee amputation of left lower extremity (HCC) [X10.626R] ?Patient Active Problem List  ? Diagnosis Date Noted  ? S/P BKA (below knee amputation), left (HCC) 10/30/2021  ? History of below knee amputation, left (HCC) 10/30/2021  ? Dehiscence of amputation stump of left lower extremity (HCC)   ?  Below-knee amputation of left lower extremity (HCC) 09/02/2021  ? Severe protein-calorie malnutrition (HCC)   ? MSSA bacteremia   ? Ulcer of left foot (HCC)   ? Subacute osteomyelitis, left ankle and foot (HCC)   ? Diabetic foot infection (HCC) 02/14/2021  ? Diabetic ulcer of left foot associated with  type 1 diabetes mellitus, limited to breakdown of skin (HCC)   ? Leukocytosis 02/13/2021  ? Hyponatremia 02/13/2021  ? Normocytic anemia 02/13/2021  ? Hyperglycemia due to diabetes mellitus (HCC) 02/13/2021  ? Open wound of left foot 02/12/2021  ? Left leg swelling 02/02/2021  ? History of complete ray amputation of fifth toe of left foot (HCC) 11/04/2020  ? Immunization due 11/04/2020  ? Anxiety 09/30/2020  ? Preventative health care 12/13/2019  ? S/P aortic valve replacement with mechanical valve 12/05/2019  ? S/P AVR (aortic valve replacement) 12/05/2019  ? Syncope 11/10/2019  ? Personal history of noncompliance with medical treatment, presenting hazards to health 03/16/2018  ? Uncontrolled diabetes mellitus with hyperglycemia, without long-term current use of insulin (HCC) 10/12/2015  ? Hyperlipidemia associated with type 2 diabetes mellitus (HCC) 10/12/2015  ? Morbid obesity (HCC) 10/12/2015  ? Chest pain 07/02/2013  ? HTN (hypertension) 07/02/2013  ? ?PCP:  Pcp, No ?Pharmacy:   ?[id number] - [place], [place] - [address] ?[address] ?[place] [address] ?Phone: [phone] Fax: [phone] ? ? ? ? ?Social Determinants of Health (SDOH) Interventions ?  ? ?Readmission Risk Interventions ? ?  07/10/2019  ? 10:11 AM  ?Readmission Risk Prevention Plan  ?Post Dischage Appt Complete  ?Medication Screening Complete  ?Transportation Screening Complete  ? ? ? ?

## 2021-12-28 ENCOUNTER — Inpatient Hospital Stay: Payer: Self-pay

## 2021-12-28 ENCOUNTER — Other Ambulatory Visit (HOSPITAL_COMMUNITY): Payer: Self-pay

## 2021-12-28 ENCOUNTER — Encounter (HOSPITAL_COMMUNITY): Payer: Self-pay | Admitting: Orthopedic Surgery

## 2021-12-28 DIAGNOSIS — S88112A Complete traumatic amputation at level between knee and ankle, left lower leg, initial encounter: Secondary | ICD-10-CM | POA: Diagnosis not present

## 2021-12-28 LAB — AEROBIC/ANAEROBIC CULTURE W GRAM STAIN (SURGICAL/DEEP WOUND)

## 2021-12-28 LAB — GLUCOSE, CAPILLARY
Glucose-Capillary: 141 mg/dL — ABNORMAL HIGH (ref 70–99)
Glucose-Capillary: 152 mg/dL — ABNORMAL HIGH (ref 70–99)
Glucose-Capillary: 133 mg/dL — ABNORMAL HIGH (ref 70–99)
Glucose-Capillary: 136 mg/dL — ABNORMAL HIGH (ref 70–99)

## 2021-12-28 LAB — PROTIME-INR
INR: 1.8 — ABNORMAL HIGH (ref 0.8–1.2)
Prothrombin Time: 20.7 seconds — ABNORMAL HIGH (ref 11.4–15.2)

## 2021-12-28 MED ORDER — WARFARIN SODIUM 5 MG PO TABS
10.0000 mg | ORAL_TABLET | Freq: Once | ORAL | Status: AC
  Administered 2021-12-28: 10 mg via ORAL
  Filled 2021-12-28: qty 2

## 2021-12-28 MED ORDER — SODIUM CHLORIDE 0.9 % IV SOLN
1.0000 g | INTRAVENOUS | Status: DC
  Administered 2021-12-28: 1000 mg via INTRAVENOUS
  Filled 2021-12-28 (×2): qty 1

## 2021-12-28 MED ORDER — CHLORHEXIDINE GLUCONATE CLOTH 2 % EX PADS: 6.0000 | MEDICATED_PAD | Freq: Every day | CUTANEOUS | Status: DC

## 2021-12-28 MED ORDER — AMOXICILLIN 500 MG PO CAPS
1000.0000 mg | ORAL_CAPSULE | Freq: Three times a day (TID) | ORAL | Status: DC
  Administered 2021-12-28 – 2021-12-29 (×4): 1000 mg via ORAL
  Filled 2021-12-28 (×5): qty 2

## 2021-12-28 MED ORDER — ERTAPENEM SODIUM 1 G IJ SOLR
1.0000 g | INTRAMUSCULAR | Status: DC
  Filled 2021-12-28 (×2): qty 1

## 2021-12-28 MED ORDER — SODIUM CHLORIDE 0.9 % IV SOLN
200.0000 mg | Freq: Every day | INTRAVENOUS | Status: DC
  Administered 2021-12-29: 200 mg via INTRAVENOUS
  Filled 2021-12-28: qty 10

## 2021-12-28 MED ORDER — SODIUM CHLORIDE 0.9% FLUSH: 10.0000 mL | INTRAVENOUS | Status: DC | PRN

## 2021-12-28 MED ORDER — AMOXICILLIN 500 MG PO CAPS
1000.0000 mg | ORAL_CAPSULE | Freq: Three times a day (TID) | ORAL | 0 refills | Status: AC
  Filled 2021-12-28: qty 180, 30d supply, fill #0

## 2021-12-28 NOTE — Progress Notes (Signed)
Occupational Therapy Treatment ?Patient Details ?Name: Adam Long ?MRN: [id number] ?DOB: 1976-08-10 ?Today's Date: 12/28/2021 ? ? ?History of present illness The pt is a 46 yo male presenting 3/31 for L BKA revision. PMH includes: anxity, aortic stenosis, CAD, depression, DM II, HTN, HLD, morbid obesity, AVR, amputations of L toes -> L BKA in 12/22 and previous revision on 2/3. ?  ?OT comments ? Pt progressing well towards goals this session, able to complete UB dressing with set up A, able to complete stand pivot transfer to chair with RW with min guard A. Pt presents with impairments listed below, will follow acutely. Continue to recommend HHOT at d/c.  ? ?Recommendations for follow up therapy are one component of a multi-disciplinary discharge planning process, led by the attending physician.  Recommendations may be updated based on patient status, additional functional criteria and insurance authorization. ?   ?Follow Up Recommendations ? Home health OT  ?  ?Assistance Recommended at Discharge PRN  ?Patient can return home with the following ? Assistance with cooking/housework;A little help with bathing/dressing/bathroom;Help with stairs or ramp for entrance ?  ?Equipment Recommendations ? None recommended by OT  ?  ?Recommendations for Other Services   ? ?  ?Precautions / Restrictions Precautions ?Precautions: Fall ?Precaution Comments: L BKA with wound vac ?Restrictions ?Weight Bearing Restrictions: Yes ?LLE Weight Bearing: Non weight bearing  ? ? ?  ? ?Mobility Bed Mobility ?Overal bed mobility: Modified Independent ?  ?  ?  ?  ?  ?  ?General bed mobility comments: use of bed rail, but no assist ?  ? ?Transfers ?Overall transfer level: Needs assistance ?Equipment used: Rolling walker (2 wheels) ?Transfers: Sit to/from Stand, Bed to chair/wheelchair/BSC ?Sit to Stand: Min guard ?  ?  ?Step pivot transfers: Min guard ?  ?  ?  ?  ?  ?Balance Overall balance assessment: Needs assistance ?Sitting-balance support:  No upper extremity supported, Feet supported ?Sitting balance-Leahy Scale: Good ?Sitting balance - Comments: can reach oustide BOS towards floor without LOB ?  ?Standing balance support: Bilateral upper extremity supported, Reliant on assistive device for balance ?Standing balance-Leahy Scale: Fair ?Standing balance comment: stable with BUE support on RW. ?  ?  ?  ?  ?  ?  ?  ?  ?  ?  ?  ?   ? ?ADL either performed or assessed with clinical judgement  ? ?ADL Overall ADL's : Needs assistance/impaired ?  ?  ?  ?  ?  ?  ?  ?  ?Upper Body Dressing : Set up;Sitting ?Upper Body Dressing Details (indicate cue type and reason): to don shirt ?  ?  ?Toilet Transfer: Min guard;Rolling walker (2 wheels);BSC/3in1;Stand-pivot ?Toilet Transfer Details (indicate cue type and reason): simulated to chair ?  ?  ?  ?  ?Functional mobility during ADLs: Min guard;Rolling walker (2 wheels) ?  ?  ? ?Extremity/Trunk Assessment Upper Extremity Assessment ?Upper Extremity Assessment: Overall WFL for tasks assessed ?  ?Lower Extremity Assessment ?Lower Extremity Assessment: Defer to PT evaluation ?  ?  ?  ? ?Vision   ?Vision Assessment?: No apparent visual deficits ?  ?Perception Perception ?Perception: Not tested ?  ?Praxis Praxis ?Praxis: Not tested ?  ? ?Cognition Arousal/Alertness: Awake/alert ?Behavior During Therapy: [facility] for tasks assessed/performed ?Overall Cognitive Status: Within Functional Limits for tasks assessed ?  ?  ?  ?  ?  ?  ?  ?  ?  ?  ?  ?  ?  ?  ?  ?  ?  ?  ?  ?   ?  Exercises   ? ?  ?Shoulder Instructions   ? ? ?  ?General Comments VSS on RA  ? ? ?Pertinent Vitals/ Pain       Pain Assessment ?Pain Assessment: No/denies pain ? ?Home Living   ?  ?  ?  ?  ?  ?  ?  ?  ?  ?  ?  ?  ?  ?  ?  ?  ?  ?  ? ?  ?Prior Functioning/Environment    ?  ?  ?  ?   ? ?Frequency ? Min 2X/week  ? ? ? ? ?  ?Progress Toward Goals ? ?OT Goals(current goals can now be found in the care plan section) ? Progress towards OT goals: Progressing toward  goals ? ?Acute Rehab OT Goals ?Patient Stated Goal: to go home ?OT Goal Formulation: With patient ?Time For Goal Achievement: 01/09/22 ?Potential to Achieve Goals: Good  ?Plan Discharge plan remains appropriate;Frequency remains appropriate   ? ?Co-evaluation ? ? ?   ?  ?  ?  ?  ? ?  ?AM-PAC OT "6 Clicks" Daily Activity     ?Outcome Measure ? ? Help from another person eating meals?: None ?Help from another person taking care of personal grooming?: None ?Help from another person toileting, which includes using toliet, bedpan, or urinal?: A Little ?Help from another person bathing (including washing, rinsing, drying)?: A Little ?Help from another person to put on and taking off regular upper body clothing?: None ?Help from another person to put on and taking off regular lower body clothing?: A Little ?6 Click Score: 21 ? ?  ?End of Session Equipment Utilized During Treatment: Rolling walker (2 wheels);Gait belt ? ?OT Visit Diagnosis: Unsteadiness on feet (R26.81);Other abnormalities of gait and mobility (R26.89) ?  ?Activity Tolerance Patient tolerated treatment well ?  ?Patient Left in chair;with call bell/phone within reach;with chair alarm set ?  ?Nurse Communication Mobility status ?  ? ?   ? ?Time: 6767-2094 ?OT Time Calculation (min): 23 min ? ?Charges: OT General Charges ?$OT Visit: 1 Visit ?OT Treatments ?$Self Care/Home Management : 23-37 mins ? ?Alfonzo Beers, OTD, OTR/L ?Acute Rehab ?[phone] ? ? ?Mayer Masker ?12/28/2021, 1:40 PM ?

## 2021-12-28 NOTE — TOC Progression Note (Addendum)
Transition of Care (TOC) - Progression Note  ? ? ?Patient Details  ?Name: Adam Long ?MRN: [id number] ?Date of Birth: 10-09-1975 ? ?Transition of Care (TOC) CM/SW Contact  ?Bess Kinds, RN ?Phone [phone] ?12/28/2021, 10:57 AM ? ?Clinical Narrative:    ? ?Patient to get PICC today with plan for transition home tomorrow. Notified Pam at [facility] of need for IV antibiotics. Notified Pearson Grippe at [facility] of need for RN, PT, OT - advised that [facility] can start care on Wednesday. Patient will need HH RN/PT/OT orders with Face to Face.  ? ?Expected Discharge Plan: Home w Home Health Services ?Barriers to Discharge: Continued Medical Work up ? ?Expected Discharge Plan and Services ?Expected Discharge Plan: Home w Home Health Services ?  ?Discharge Planning Services: CM Consult ?Post Acute Care Choice: Home Health ?Living arrangements for the past 2 months: Single Family Home ?                ?  ?  ?  ?  ?  ?HH Arranged: RN, PT, OT ?HH Agency: [employer], [facility] ([facility] ?Date HH Agency Contacted: 12/27/21 ?Time HH Agency Contacted: 1650 ?Representative spoke with at [facility] Agency: Barbara Cower ([facility]) and Elita Quick ([facility]) ? ? ?Social Determinants of Health (SDOH) Interventions ?  ? ?Readmission Risk Interventions ? ?  07/10/2019  ? 10:11 AM  ?Readmission Risk Prevention Plan  ?Post Dischage Appt Complete  ?Medication Screening Complete  ?Transportation Screening Complete  ? ? ?

## 2021-12-28 NOTE — Progress Notes (Signed)
ANTICOAGULATION CONSULT NOTE ? ?Pharmacy Consult for Warfarin ?Indication:  mechanical aortic valve ? ?Allergies  ?Allergen Reactions  ? Pollen Extract Other (See Comments)  ?  Runny nose, watery eyes, sneezing  ? ? ?Patient Measurements: ?Height: 5\' 10"  (177.8 cm) ?Weight: (!) 156.5 kg (345 lb) ?IBW/kg (Calculated) : 73 ? ?Vital Signs: ?Temp: 98.2 ?F (36.8 ?C) (04/03 0740) ?Temp Source: Oral (04/03 0740) ?BP: 117/45 (04/03 0740) ?Pulse Rate: 80 (04/03 0740) ? ?Labs: ?Recent Labs  ?  12/26/21 ?0226 12/27/21 ?0208 12/28/21 ?0116  ?HGB 8.2* 7.7*  --   ?HCT 26.3* 24.8*  --   ?PLT 270 230  --   ?LABPROT 22.0* 19.9* 20.7*  ?INR 1.9* 1.7* 1.8*  ?CREATININE  --  0.79  --   ? ? ?Estimated Creatinine Clearance: 175.5 mL/min (by C-G formula based on SCr of 0.79 mg/dL). ? ? ?PTA Warfarin Regimen:  ?- per 12/23/21 [facility] Note: 5 mg every Mon, Wed, Fri; 10 mg all other days ? ?Assessment: ?46 yo male with h/o mechanical aortic valve replacement on warfarin PTA. Patient now post-op revision of left BKA and pharmacy consulted to resume warfarin. ? ?CBC 4/02: hgb 7.7 (down from 8.2), plts 230 (down from 270). NO s/sx bleeding reported. INR today at 1.8, plateau/little change from previous days INR (1.9 and 1.7 respectively). Will continue 10mg  x1 again and trend INR and CBC. ? ?Goal of Therapy:  ?INR 2-3 ?Monitor platelets by anticoagulation protocol: Yes ?  ?Plan:  ?Give warfarin 10 mg PO x1 dose ?Check INR daily while on warfarin ?Continue to monitor H&H and platelets ? ? ? ?Thank you for allowing pharmacy to be a part of this patient?s care. ? ?6/02, PharmD ?Clinical Pharmacist ? ? ?

## 2021-12-28 NOTE — Progress Notes (Addendum)
PHARMACY CONSULT NOTE FOR: ? ?OUTPATIENT  PARENTERAL ANTIBIOTIC THERAPY (OPAT) ? ?Indication: L BKA Abscess ?Regimen: Ertapenem 1g IV q24h ?Amoxicillin 1g PO TID ?Caspofungin 50 mg IV q24 until 01/18/22 ID clinic follow-up ?End date: 02/06/22 ? ?IV antibiotic discharge orders are pended. ?To discharging provider:  please sign these orders via discharge navigator,  ?Select New Orders & click on the button choice - Manage This Unsigned Work.  ?  ? ?Thank you for allowing pharmacy to be a part of this patient's care. ? ?Adam Long ?12/28/2021, 12:18 PM ?

## 2021-12-28 NOTE — Progress Notes (Signed)
Patient ID: Adam Long, male   DOB: [birth year], 46 y.o.   MRN: [id number] ?Patient is status post revision transtibial amputation.  Deep cultures were positive for Klebsiella, and fungus.  Patient's antibiotics have been switched to imipenem.  Anticipate discharge with a PICC line.  I have placed an order for a PICC line and will consult infectious disease for antibiotic recommendations.  The tissue margins were clear at time of closure including resection of the distal centimeter of bone. ?

## 2021-12-28 NOTE — Progress Notes (Signed)
Peripherally Inserted Central Catheter Placement ? ?The IV Nurse has discussed with the patient and/or persons authorized to consent for the patient, the purpose of this procedure and the potential benefits and risks involved with this procedure.  The benefits include less needle sticks, lab draws from the catheter, and the patient may be discharged home with the catheter. Risks include, but not limited to, infection, bleeding, blood clot (thrombus formation), and puncture of an artery; nerve damage and irregular heartbeat and possibility to perform a PICC exchange if needed/ordered by physician.  Alternatives to this procedure were also discussed.  Bard Power PICC patient education guide, fact sheet on infection prevention and patient information card has been provided to patient /or left at bedside.   ? ?PICC Placement Documentation  ?PICC Single Lumen 12/28/21 Right Cephalic 42 cm 0 cm (Active)  ?Indication for Insertion or Continuance of Line Home intravenous therapies (PICC only) 12/28/21 1201  ?Exposed Catheter (cm) 0 cm 12/28/21 1201  ?Site Assessment Clean, Dry, Intact 12/28/21 1201  ?Line Status Flushed;Saline locked;Blood return noted 12/28/21 1201  ?Dressing Type Transparent;Securing device 12/28/21 1201  ?Dressing Status Antimicrobial disc in place 12/28/21 1201  ?Dressing Intervention New dressing 12/28/21 1201  ?Dressing Change Due 01/04/22 12/28/21 1201  ? ? ? ? ? ?Reginia Forts Albarece ?12/28/2021, 12:02 PM ? ?

## 2021-12-28 NOTE — Consult Note (Signed)
?   ? ? ? ? ?[facility]   ? ?Date of Admission:  12/25/2021   Total days of antibiotics: 4 ?       Day 2: Imipenem-Cilastatin  ?       Day 2: Ceftriaxone ?Day 3: Micafungin ?      ?Reason for Consult: Left BKA Abscess     ?Referring Provider: Dr. Sharol Given ?Primary Care Provider: Dr. Sharol Given ? ?Assessment: ?Adam Long is a 46 y/o male with a PMHx of diabetes, CAD who is currently admitted for left BKA abscess with suspected osteomyelitis. Per surgery note, both tibia and fibula were debrided. Cultures growing abundant ESBL Klebsiella pneumoniae, in addition to abundant Clostridium perfringens and enterococcus faecalis. Fungal cultures negative.  ? ?Given patient's complicated course, will plan to treat with 6 weeks of antimicrobials.  ? ?Plan: ?PICC ordered ?Ertapenem 1 g daily. Stop date: 02/05/2022 ?Amoxicillin 1 g TID. Stop date: 02/05/2022 ?Discontinue Micafungin  ?Follow up with [facility] in 2-3 weeks ? ?Principal Problem: ?  Below-knee amputation of left lower extremity ([place]) ? ? ? vitamin C  1,000 mg Oral Daily  ? atorvastatin  80 mg Oral Daily  ? docusate sodium  100 mg Oral Daily  ? gabapentin  600 mg Oral BID  ? insulin aspart  0-15 Units Subcutaneous TID WC  ? lisinopril  10 mg Oral Daily  ? metFORMIN  500 mg Oral BID WC  ? metoprolol tartrate  25 mg Oral BID  ? nutrition supplement (JUVEN)  1 packet Oral BID BM  ? pantoprazole  40 mg Oral Daily  ? sertraline  50 mg Oral Daily  ? Warfarin - Pharmacist Dosing Inpatient   Does not apply q1600  ? zinc sulfate  220 mg Oral Daily  ? ?HPI: Adam Long is a 46 y.o. male T2DM, mechanical aortic valve on Warfarin, HTN, CAD, HTN, HLD who is presently admitted for revision of left BKA complicated by abscess.  ? ?This AM, Mr. Wilensky denies any acute complaints, including fever, chills. He denies any surgical site pain at this time. ? ?Per chart review, patient presented on 08/27/2021 with c/o ulceration of the leg foot. He underwent left  BKA on 08/28/2021. Hospital course complicated by MSSA and GBS bacteremia with work up negative for endocarditis. Source of bacteremia suspected to be foot infection. Mr. Leaverton completed 4 weeks of IV Cefazolin (end date 09/24/2022).  ? ?On 10/16/2021, Mr. Helmes followed up with Orthopedic surgery 2/2 wound dehiscence with purulent drainage. He was started on Doxycycline. On follow up on 10/27/2021, physical examination was consistent with subacute fibula osteomyelitis with overlying cellulitis. On 10/30/2021, patient underwent revision of left BKA with Dr. Sharol Given. He was discharged on oral Doxycycline. On 11/01/2020, patient was started on a 15 day course of Ciprofloxacin.  ? ?On follow up on 12/16/2021, there was noted new dehiscence of the revision site with some necrosis but bone was unable to be probed during this examination. Patient was started on Bactrim. He was taking this prior to this admission.  ? ?Review of Systems: ?Negative except as noted above.  ? ?Past Medical History:  ?Diagnosis Date  ? Anxiety   ? Aortic stenosis   ? Cellulitis and abscess of lower extremity 06/11/2019  ? Cellulitis of fourth toe of left foot   ? Cholelithiasis   ? Coronary artery disease   ? Nonobstructive CAD (40-50% LAD) 08/2019  ? Depression   ? Diabetes mellitus without complication ([place])   ?  Phreesia 09/27/2020  ? Elevated troponin level not due myocardial infarction 11/11/2019  ? Essential hypertension   ? Gangrene of toe of left foot ([place]) 07/06/2019  ? Heart murmur   ? Phreesia 09/27/2020  ? Hyperlipidemia   ? Phreesia 09/27/2020  ? Hypertension   ? Phreesia 09/27/2020  ? Mixed hyperlipidemia   ? Morbid obesity ([place])   ? S/P aortic valve replacement with mechanical valve 12/05/2019  ? 25 mm Carbomedics top hat bileaflet mechanical valve via partial upper hemi-sternotomy  ? Severe aortic stenosis 09/24/2019  ? Type 2 diabetes mellitus ([place])   ? ?Social History  ? ?Tobacco Use  ? Smoking status: Never  ? Smokeless tobacco:  Former  ?  Types: Chew  ?  Quit date: 2021  ?Vaping Use  ? Vaping Use: Never used  ?Substance Use Topics  ? Alcohol use: Yes  ?  Alcohol/week: 4.0 standard drinks  ?  Types: 4 Cans of beer per week  ?  Comment: occasionally  ? Drug use: No  ? ? ?Family History  ?Problem Relation Age of Onset  ? Cancer Mother   ?     Brain  ? Heart disease Father   ? Hyperlipidemia Father   ? Hypertension Father   ? Stroke Father   ? Heart murmur Sister   ? ?Allergies  ?Allergen Reactions  ? Pollen Extract Other (See Comments)  ?  Runny nose, watery eyes, sneezing  ? ?OBJECTIVE: ?Blood pressure (!) 117/45, pulse 80, temperature 98.2 ?F (36.8 ?C), temperature source Oral, resp. rate 17, height 5\' 10"  (1.778 m), weight (!) 156.5 kg, SpO2 99 %. ? ?Physical Exam ?Vitals and nursing note reviewed.  ?Constitutional:   ?   Appearance: He is obese.  ?HENT:  ?   Head: Normocephalic and atraumatic.  ?Eyes:  ?   Conjunctiva/sclera: Conjunctivae normal.  ?   Pupils: Pupils are equal, round, and reactive to light.  ?Cardiovascular:  ?   Rate and Rhythm: Normal rate and regular rhythm.  ?Pulmonary:  ?   Effort: Pulmonary effort is normal. No respiratory distress.  ?   Breath sounds: No wheezing or rales.  ?Abdominal:  ?   General: Bowel sounds are normal.  ?   Palpations: Abdomen is soft.  ?Musculoskeletal:  ?   Left Lower Extremity: Left leg is amputated below knee. (Wound vac in place with serosanginous discharge) ?Skin: ?   General: Skin is warm and dry.  ?Neurological:  ?   General: No focal deficit present.  ?   Mental Status: He is alert and oriented to person, place, and time.  ?Psychiatric:     ?   Mood and Affect: Mood normal.     ?   Behavior: Behavior normal.  ? ? ?Lab Results ?Lab Results  ?Component Value Date  ? WBC 11.3 (H) 12/27/2021  ? HGB 7.7 (L) 12/27/2021  ? HCT 24.8 (L) 12/27/2021  ? MCV 74.9 (L) 12/27/2021  ? PLT 230 12/27/2021  ?  ?Lab Results  ?Component Value Date  ? CREATININE 0.79 12/27/2021  ? BUN 15 12/27/2021  ? NA  135 12/27/2021  ? K 4.2 12/27/2021  ? CL 101 12/27/2021  ? CO2 23 12/27/2021  ?  ?Lab Results  ?Component Value Date  ? ALT 20 10/29/2021  ? AST 22 10/29/2021  ? ALKPHOS 57 10/29/2021  ? BILITOT 0.8 10/29/2021  ?  ?Microbiology: ?Recent Results (from the past 240 hour(s))  ?Fungus Culture With Stain     Status:  None (Preliminary result)  ? Collection Time: 12/25/21 12:13 PM  ? Specimen: Abscess  ?Result Value Ref Range Status  ? Fungus Stain Final report  Final  ?  Comment: (NOTE) ?Performed At: [facility] ?[address] [place], [place] [id number] ?Rush Farmer MD [id number] ?  ? Fungus (Mycology) Culture PENDING  Incomplete  ? Fungal Source TIS ABSC LEFT BKA  Final  ?  Comment: Performed at [facility] [address] [place], [place] [address]  ?Aerobic/Anaerobic Culture w Gram Stain (surgical/deep wound)     Status: None  ? Collection Time: 12/25/21 12:13 PM  ? Specimen: Abscess  ?Result Value Ref Range Status  ? Specimen Description TISSUE  Final  ? Special Requests ABSCESS LEFT BELOW KNEE AMPUTATION SPEC A  Final  ? Gram Stain   Final  ?  FEW WBC PRESENT,BOTH PMN AND MONONUCLEAR ?MODERATE GRAM POSITIVE COCCI ?FEW GRAM NEGATIVE RODS ?FEW HYPHAE, SEGMENTED ?Performed at [facility], [place] [address], [place], [place] [address] ?  ? Culture   Final  ?  ABUNDANT KLEBSIELLA PNEUMONIAE ?Confirmed Extended Spectrum Beta-Lactamase Producer (ESBL).  In bloodstream infections from ESBL organisms, carbapenems are preferred over piperacillin/tazobactam. They are shown to have a lower risk of mortality. ?ABUNDANT CLOSTRIDIUM PERFRINGENS ?ABUNDANT ENTEROCOCCUS FAECALIS ?  ? Report Status 12/28/2021 FINAL  Final  ? Organism ID, Bacteria KLEBSIELLA PNEUMONIAE  Final  ? Organism ID, Bacteria ENTEROCOCCUS FAECALIS  Final  ?    Susceptibility  ? Enterococcus faecalis - MIC*  ?  AMPICILLIN <=2 SENSITIVE Sensitive   ?  VANCOMYCIN 1 SENSITIVE Sensitive   ?  GENTAMICIN SYNERGY RESISTANT Resistant   ?  *  ABUNDANT ENTEROCOCCUS FAECALIS  ? Klebsiella pneumoniae - MIC*  ?  AMPICILLIN >=32 RESISTANT Resistant   ?  CEFAZOLIN >=64 RESISTANT Resistant   ?  CEFEPIME 2 SENSITIVE Sensitive   ?  CEFTAZIDIME RESISTANT

## 2021-12-29 LAB — GLUCOSE, CAPILLARY
Glucose-Capillary: 174 mg/dL — ABNORMAL HIGH (ref 70–99)
Glucose-Capillary: 123 mg/dL — ABNORMAL HIGH (ref 70–99)

## 2021-12-29 LAB — PROTIME-INR
INR: 1.8 — ABNORMAL HIGH (ref 0.8–1.2)
Prothrombin Time: 21.1 seconds — ABNORMAL HIGH (ref 11.4–15.2)

## 2021-12-29 MED ORDER — ERTAPENEM IV (FOR PTA / DISCHARGE USE ONLY): 1.0000 g | INTRAVENOUS | 0 refills | Status: AC

## 2021-12-29 MED ORDER — HEPARIN SOD (PORK) LOCK FLUSH 100 UNIT/ML IV SOLN
250.0000 [IU] | INTRAVENOUS | Status: AC | PRN
  Administered 2021-12-29: 250 [IU]

## 2021-12-29 MED ORDER — OXYCODONE-ACETAMINOPHEN 5-325 MG PO TABS: 1.0000 | ORAL_TABLET | ORAL | 0 refills | Status: DC | PRN

## 2021-12-29 MED ORDER — WARFARIN SODIUM 5 MG PO TABS: 10.0000 mg | ORAL_TABLET | Freq: Once | ORAL | Status: DC

## 2021-12-29 MED ORDER — CASPOFUNGIN IV (FOR PTA / DISCHARGE USE ONLY): 50.0000 mg | INTRAVENOUS | 0 refills | Status: DC

## 2021-12-29 MED ORDER — SODIUM CHLORIDE 0.9 % IV SOLN
1.0000 g | INTRAVENOUS | Status: DC
  Administered 2021-12-29: 1000 mg via INTRAVENOUS
  Filled 2021-12-29: qty 1

## 2021-12-29 NOTE — Discharge Summary (Signed)
?Discharge Diagnoses:  ?Principal Problem: ?  Below-knee amputation of left lower extremity ([place]) ? ? ?Surgeries: Procedure(s): ?REVISION LEFT BELOW KNEE AMPUTATION ?APPLICATION OF WOUND VAC on 12/25/2021  ?  ?Consultants:  ? ?Discharged Condition: Improved ? ?Hospital Course: Adam Long is an 46 y.o. male who was admitted 12/25/2021 with a chief complaint of dehiscence of BKA, with a final diagnosis of Dehiscence Left Below Knee Amputation.  Patient was brought to the operating room on 12/25/2021 and underwent Procedure(s): ?REVISION LEFT BELOW KNEE AMPUTATION ?APPLICATION OF WOUND VAC.   ? ?Patient was given perioperative antibiotics:  ?Anti-infectives (From admission, onward)  ? ? Start     Dose/Rate Route Frequency Ordered Stop  ? 12/29/21 1000  micafungin (MYCAMINE) 200 mg in sodium chloride 0.9 % 100 mL IVPB       ? 200 mg ?110 mL/hr over 1 Hours Intravenous Daily 12/28/21 1543    ? 12/29/21 0000  ertapenem [facility]) IVPB       ? 1 g Intravenous Every 24 hours 12/29/21 0719 02/07/22 2359  ? 12/29/21 0000  caspofungin (CANCIDAS) IVPB       ? 50 mg Intravenous Every 24 hours 12/29/21 0719 01/19/22 2359  ? 12/28/21 1815  ertapenem (INVANZ) 1,000 mg in sodium chloride 0.9 % 100 mL IVPB       ? 1 g ?200 mL/hr over 30 Minutes Intravenous Every 24 hours 12/28/21 1730 02/07/22 1829  ? 12/28/21 1400  amoxicillin (AMOXIL) capsule 1,000 mg       ? 1,000 mg Oral Every 8 hours 12/28/21 1211 02/07/22 0559  ? 12/28/21 1300  ertapenem (INVANZ) injection 1,000 mg  Status:  Discontinued       ? 1 g Intramuscular Every 24 hours 12/28/21 1211 12/28/21 1730  ? 12/28/21 0000  imipenem-cilastatin (PRIMAXIN) 500 mg in sodium chloride 0.9 % 100 mL IVPB  Status:  Discontinued       ? 500 mg ?200 mL/hr over 30 Minutes Intravenous Every 6 hours 12/27/21 1526 12/28/21 1211  ? 12/28/21 0000  amoxicillin (AMOXIL) 500 MG capsule       ?Note to Pharmacy: For DC tomorrow  ? 1,000 mg Oral 3 times daily 12/28/21 1217 02/06/22 2359  ? 12/27/21  1515  imipenem-cilastatin (PRIMAXIN) 1,250 mg in sodium chloride 0.9 % 250 mL IVPB  Status:  Discontinued       ? 1,250 mg ?275 mL/hr over 60 Minutes Intravenous Every 6 hours 12/27/21 1421 12/27/21 1526  ? 12/26/21 1600  vancomycin (VANCOREADY) IVPB 1250 mg/250 mL  Status:  Discontinued       ? 1,250 mg ?166.7 mL/hr over 90 Minutes Intravenous Every 8 hours 12/26/21 1004 12/27/21 1421  ? 12/26/21 1030  micafungin (MYCAMINE) 200 mg in sodium chloride 0.9 % 100 mL IVPB  Status:  Discontinued       ? 200 mg ?110 mL/hr over 1 Hours Intravenous Daily 12/26/21 0938 12/28/21 1211  ? 12/26/21 1015  cefTRIAXone (ROCEPHIN) 2 g in sodium chloride 0.9 % 100 mL IVPB  Status:  Discontinued       ? 2 g ?200 mL/hr over 30 Minutes Intravenous Every 24 hours 12/26/21 0926 12/27/21 1421  ? 12/26/21 0000  vancomycin (VANCOREADY) IVPB 1750 mg/350 mL  Status:  Discontinued       ? 1,750 mg ?175 mL/hr over 120 Minutes Intravenous Every 8 hours 12/25/21 1512 12/26/21 1004  ? 12/25/21 2000  ceFAZolin (ANCEF) IVPB 2g/100 mL premix       ?  2 g ?200 mL/hr over 30 Minutes Intravenous Every 8 hours 12/25/21 1613 12/25/21 2130  ? 12/25/21 1600  vancomycin (VANCOREADY) IVPB 2000 mg/400 mL       ? 2,000 mg ?200 mL/hr over 120 Minutes Intravenous  Once 12/25/21 1512 12/25/21 1840  ? 12/25/21 1530  ceFAZolin (ANCEF) IVPB 2g/100 mL premix  Status:  Discontinued       ? 2 g ?200 mL/hr over 30 Minutes Intravenous Every 8 hours 12/25/21 1442 12/25/21 1613  ? 12/25/21 1146  ceFAZolin (ANCEF) 1-4 GM/50ML-% IVPB       ?Note to Pharmacy: Tamsen Snider M: cabinet override  ?    12/25/21 1146 12/25/21 2359  ? 12/25/21 0930  ceFAZolin (ANCEF) IVPB 2g/100 mL premix       ? 2 g ?200 mL/hr over 30 Minutes Intravenous On call to O.R. 12/25/21 0922 12/25/21 1226  ? ?  ?. ? ?Patient was given sequential compression devices, early ambulation, and aspirin for DVT prophylaxis. ? ?Recent vital signs: Patient Vitals for the past 24 hrs: ? BP Temp Temp src Pulse Resp  SpO2  ?12/29/21 0452 112/61 98.1 ?F (36.7 ?C) -- 77 18 97 %  ?12/28/21 2109 (!) 113/47 97.9 ?F (36.6 ?C) Oral 86 16 100 %  ?12/28/21 1549 125/61 97.7 ?F (36.5 ?C) Oral 82 18 98 %  ?12/28/21 0740 (!) 117/45 98.2 ?F (36.8 ?C) Oral 80 17 99 %  ?. ? ?Recent laboratory studies: [place] EKG SITE RITE ? ?Result Date: 12/28/2021 ?If [employer] not attached, placement could not be confirmed due to current cardiac rhythm. ? ?[place] EKG SITE RITE ? ?Result Date: 12/28/2021 ?If [employer] not attached, placement could not be confirmed due to current cardiac rhythm.  ? ?Discharge Medications:   ?Allergies as of 12/29/2021   ? ?   Reactions  ? Pollen Extract Other (See Comments)  ? Runny nose, watery eyes, sneezing  ? ?  ? ?  ?Medication List  ?  ? ?STOP taking these medications   ? ?oxyCODONE-acetaminophen 10-325 MG tablet ?Commonly known as: PERCOCET ?Replaced by: oxyCODONE-acetaminophen 5-325 MG tablet ?  ? ?  ? ?TAKE these medications   ? ?amoxicillin 500 MG capsule ?Commonly known as: AMOXIL ?Take 2 capsules (1,000 mg total) by mouth 3 (three) times daily. ?  ?atorvastatin 80 MG tablet ?Commonly known as: LIPITOR ?Take 1 tablet (80 mg total) by mouth daily. ?What changed: when to take this ?  ?caspofungin  IVPB ?Commonly known as: CANCIDAS ?Inject 50 mg into the vein daily for 21 days. Indication:  L BKA Abscess ?First Dose: Yes ?Last Day of Therapy:  01/18/22 ?Labs - Once weekly:  CBC/D and BMP, ?Labs - Every other week:  ESR and CRP ?Method of administration: Elastomeric ?Method of administration may be changed at the discretion of home infusion pharmacist based upon assessment of the patient and/or caregiver's ability to self-administer the medication ordered. ?  ?cephALEXin 500 MG capsule ?Commonly known as: Keflex ?Take 1 capsule (500 mg total) by mouth 3 (three) times daily. ?  ?ertapenem  IVPB ?Commonly known as: INVANZ ?Inject 1 g into the vein daily. Indication:  L BKA Abscess ?First Dose: Yes ?Last Day of Therapy:   02/06/22 ?Labs - Once weekly:  CBC/D and BMP, ?Labs - Every other week:  ESR and CRP ?Method of administration: Mini-Bag Plus / Gravity ?Method of administration may be changed at the discretion of home infusion pharmacist based upon assessment of the patient and/or caregiver's  ability to self-administer the medication ordered. ?  ?feeding supplement (PRO-STAT SUGAR FREE 64) Liqd ?Take 30 mLs by mouth 3 (three) times daily with meals. ?  ?gabapentin 300 MG capsule ?Commonly known as: NEURONTIN ?Take 2 capsules (600 mg total) by mouth 2 (two) times daily. ?  ?lisinopril 10 MG tablet ?Commonly known as: ZESTRIL ?Take 1 tablet by mouth once daily ?What changed: how much to take ?  ?metFORMIN 500 MG tablet ?Commonly known as: GLUCOPHAGE ?Take 1 tablet (500 mg total) by mouth 2 (two) times daily with a meal. ?  ?metoprolol tartrate 25 MG tablet ?Commonly known as: LOPRESSOR ?Take 1 tablet by mouth twice daily ?  ?oxyCODONE-acetaminophen 5-325 MG tablet ?Commonly known as: PERCOCET/ROXICET ?Take 1 tablet by mouth every 4 (four) hours as needed. ?Replaces: oxyCODONE-acetaminophen 10-325 MG tablet ?  ?pantoprazole 40 MG tablet ?Commonly known as: PROTONIX ?Take 1 tablet by mouth once daily ?  ?sertraline 50 MG tablet ?Commonly known as: ZOLOFT ?Take 1 tablet (50 mg total) by mouth daily. ?  ?sulfamethoxazole-trimethoprim 800-160 MG tablet ?Commonly known as: BACTRIM DS ?Take 1 tablet by mouth 2 (two) times daily. ?  ?warfarin 10 MG tablet ?Commonly known as: COUMADIN ?Take as directed. If you are unsure how to take this medication, talk to your nurse or doctor. ?Original instructions: TAKE 2 TABLETS BY MOUTH ONCE DAILY AT  4  PM ?  ? ?  ? ?  ?  ? ? ?  ?Discharge Care Instructions  ?(From admission, onward)  ?  ? ? ?  ? ?  Start     Ordered  ? 12/29/21 0000  Change dressing on IV access line weekly and PRN  (Home infusion instructions - [facility] )       ? 12/29/21 0719  ? 12/29/21 0000  Change dressing on IV  access line weekly and PRN  (Home infusion instructions - [facility] )       ? 12/29/21 0719  ? ?  ?  ? ?  ? ? ?Diagnostic Studies: [place] EKG SITE RITE ? ?Result Date: 12/28/2021 ?If [employer] not

## 2021-12-29 NOTE — Progress Notes (Signed)
Physical Therapy Treatment ?Patient Details ?Name: Adam Long ?MRN: [id number] ?DOB: 06-06-1976 ?Today's Date: 12/29/2021 ? ? ?History of Present Illness The pt is a 46 yo male presenting 3/31 for L BKA revision. PMH includes: anxity, aortic stenosis, CAD, depression, DM II, HTN, HLD, morbid obesity, AVR, amputations of L toes -> L BKA in 12/22 and previous revision on 2/3. ? ?  ?PT Comments  ? ? Pt received sitting up in recliner and agreeable to session focused on LE therex for increased ROM and strength with good tolerance. Pt able to come to standing with min guard for safety with steady rise on RLE. Pt able to demonstrate steady hop-to gait pattern requiring min guard for safety with no LOB. Distance limited secondary to pain and general fatigue. Anticipate safe pt discharge once medically cleared, will follow acutely. Pt continues to benefit from skilled PT services to progress toward functional mobility goals.  ?  ?Recommendations for follow up therapy are one component of a multi-disciplinary discharge planning process, led by the attending physician.  Recommendations may be updated based on patient status, additional functional criteria and insurance authorization. ? ?Follow Up Recommendations ? No PT follow up ?  ?  ?Assistance Recommended at Discharge Intermittent Supervision/Assistance  ?Patient can return home with the following A little help with walking and/or transfers;A little help with bathing/dressing/bathroom;Help with stairs or ramp for entrance ?  ?Equipment Recommendations ? None recommended by PT  ?  ?Recommendations for Other Services   ? ? ?  ?Precautions / Restrictions Precautions ?Precautions: Fall ?Precaution Comments: L BKA with wound vac ?Restrictions ?Weight Bearing Restrictions: Yes ?LLE Weight Bearing: Non weight bearing  ?  ? ?Mobility ? Bed Mobility ?  ?  ?  ?  ?  ?  ?  ?General bed mobility comments: pt OOB in recliner on arrival ?  ? ?Transfers ?Overall transfer level: Needs  assistance ?Equipment used: Rolling walker (2 wheels) ?Transfers: Sit to/from Stand ?Sit to Stand: Min guard ?  ?  ?  ?  ?  ?General transfer comment: min guard for safety ?  ? ?Ambulation/Gait ?Ambulation/Gait assistance: Min guard ?Gait Distance (Feet): 5 Feet ?Assistive device: Rolling walker (2 wheels) ?  ?Gait velocity: decreased ?  ?  ?General Gait Details: pt hopping with use of RW and RLE, good stability and slow/controlled speed ? ? ?Stairs ?  ?  ?  ?  ?  ? ? ?Wheelchair Mobility ?  ? ?Modified Rankin (Stroke Patients Only) ?  ? ? ?  ?Balance Overall balance assessment: Needs assistance ?Sitting-balance support: No upper extremity supported, Feet supported ?Sitting balance-Leahy Scale: Good ?Sitting balance - Comments: can reach oustide BOS towards floor without LOB ?  ?Standing balance support: Bilateral upper extremity supported, Reliant on assistive device for balance ?Standing balance-Leahy Scale: Fair ?Standing balance comment: stable with BUE support on RW. ?  ?  ?  ?  ?  ?  ?  ?  ?  ?  ?  ?  ? ?  ?Cognition Arousal/Alertness: Awake/alert ?Behavior During Therapy: [facility] for tasks assessed/performed ?Overall Cognitive Status: Within Functional Limits for tasks assessed ?  ?  ?  ?  ?  ?  ?  ?  ?  ?  ?  ?  ?  ?  ?  ?  ?  ?  ?  ? ?  ?Exercises General Exercises - Lower Extremity ?Quad Sets: AROM, Left, 10 reps, Supine ?Long Arc Quad: AROM, Left, 10 reps, Seated ?Hip  ABduction/ADduction: AROM, Left, 10 reps, Supine ?Straight Leg Raises: AROM, Left, 10 reps, Supine ?Other Exercises ?Other Exercises: pillow squeeze x10 ? ?  ?General Comments General comments (skin integrity, edema, etc.): VSS on RA ?  ?  ? ?Pertinent Vitals/Pain    ? ? ?Home Living   ?  ?  ?  ?  ?  ?  ?  ?  ?  ?   ?  ?Prior Function    ?  ?  ?   ? ?PT Goals (current goals can now be found in the care plan section) Acute Rehab PT Goals ?PT Goal Formulation: With patient ?Time For Goal Achievement: 01/09/22 ? ?  ?Frequency ? ? ? Min  3X/week ? ? ? ?  ?PT Plan    ? ? ?Co-evaluation   ?  ?  ?  ?  ? ?  ?AM-PAC PT "6 Clicks" Mobility   ?Outcome Measure ? Help needed turning from your back to your side while in a flat bed without using bedrails?: A Little ?Help needed moving from lying on your back to sitting on the side of a flat bed without using bedrails?: A Little ?Help needed moving to and from a bed to a chair (including a wheelchair)?: A Little ?Help needed standing up from a chair using your arms (e.g., wheelchair or bedside chair)?: A Little ?Help needed to walk in hospital room?: A Little ?Help needed climbing 3-5 steps with a railing? : A Lot ?6 Click Score: 17 ? ?  ?End of Session   ?Activity Tolerance: Patient tolerated treatment well;Patient limited by lethargy ?Patient left: in chair;with call bell/phone within reach ?Nurse Communication: Mobility status ?PT Visit Diagnosis: Other abnormalities of gait and mobility (R26.89) ?  ? ? ?Time: 1572-6203 ?PT Time Calculation (min) (ACUTE ONLY): 13 min ? ?Charges:  $Therapeutic Exercise: 8-22 mins          ?          ? ?Lenora Boys. PTA ?Acute Rehabilitation Services ?Office: [phone] ? ? ? ?Marlana Salvage Lawrance Wiedemann ?12/29/2021, 9:23 AM ? ?

## 2021-12-29 NOTE — Plan of Care (Signed)

## 2021-12-29 NOTE — Discharge Summary (Signed)
?Discharge Diagnoses:  ?Principal Problem: ?  Below-knee amputation of left lower extremity ([place]) ? ? ?Surgeries: Procedure(s): ?REVISION LEFT BELOW KNEE AMPUTATION ?APPLICATION OF WOUND VAC on 12/25/2021  ?  ?Consultants:  ? ?Discharged Condition: Improved ? ?Hospital Course: Adam Long is an 46 y.o. male who was admitted 12/25/2021 with a chief complaint of abscess left below knee amputation, with a final diagnosis of Dehiscence Left Below Knee Amputation.  Patient was brought to the operating room on 12/25/2021 and underwent Procedure(s): ?REVISION LEFT BELOW KNEE AMPUTATION ?APPLICATION OF WOUND VAC.   ? ?Patient was given perioperative antibiotics:  ?Anti-infectives (From admission, onward)  ? ? Start     Dose/Rate Route Frequency Ordered Stop  ? 12/29/21 1000  micafungin (MYCAMINE) 200 mg in sodium chloride 0.9 % 100 mL IVPB       ? 200 mg ?110 mL/hr over 1 Hours Intravenous Daily 12/28/21 1543    ? 12/29/21 1000  ertapenem (INVANZ) 1,000 mg in sodium chloride 0.9 % 100 mL IVPB       ? 1 g ?200 mL/hr over 30 Minutes Intravenous Every 24 hours 12/29/21 0919 02/07/22 1014  ? 12/29/21 0000  ertapenem [facility]) IVPB       ? 1 g Intravenous Every 24 hours 12/29/21 0719 02/07/22 2359  ? 12/29/21 0000  caspofungin (CANCIDAS) IVPB       ? 50 mg Intravenous Every 24 hours 12/29/21 0719 01/19/22 2359  ? 12/28/21 1815  ertapenem (INVANZ) 1,000 mg in sodium chloride 0.9 % 100 mL IVPB  Status:  Discontinued       ? 1 g ?200 mL/hr over 30 Minutes Intravenous Every 24 hours 12/28/21 1730 12/29/21 0919  ? 12/28/21 1400  amoxicillin (AMOXIL) capsule 1,000 mg       ? 1,000 mg Oral Every 8 hours 12/28/21 1211 02/07/22 0559  ? 12/28/21 1300  ertapenem (INVANZ) injection 1,000 mg  Status:  Discontinued       ? 1 g Intramuscular Every 24 hours 12/28/21 1211 12/28/21 1730  ? 12/28/21 0000  imipenem-cilastatin (PRIMAXIN) 500 mg in sodium chloride 0.9 % 100 mL IVPB  Status:  Discontinued       ? 500 mg ?200 mL/hr over 30 Minutes  Intravenous Every 6 hours 12/27/21 1526 12/28/21 1211  ? 12/28/21 0000  amoxicillin (AMOXIL) 500 MG capsule       ?Note to Pharmacy: For DC tomorrow  ? 1,000 mg Oral 3 times daily 12/28/21 1217 02/06/22 2359  ? 12/27/21 1515  imipenem-cilastatin (PRIMAXIN) 1,250 mg in sodium chloride 0.9 % 250 mL IVPB  Status:  Discontinued       ? 1,250 mg ?275 mL/hr over 60 Minutes Intravenous Every 6 hours 12/27/21 1421 12/27/21 1526  ? 12/26/21 1600  vancomycin (VANCOREADY) IVPB 1250 mg/250 mL  Status:  Discontinued       ? 1,250 mg ?166.7 mL/hr over 90 Minutes Intravenous Every 8 hours 12/26/21 1004 12/27/21 1421  ? 12/26/21 1030  micafungin (MYCAMINE) 200 mg in sodium chloride 0.9 % 100 mL IVPB  Status:  Discontinued       ? 200 mg ?110 mL/hr over 1 Hours Intravenous Daily 12/26/21 0938 12/28/21 1211  ? 12/26/21 1015  cefTRIAXone (ROCEPHIN) 2 g in sodium chloride 0.9 % 100 mL IVPB  Status:  Discontinued       ? 2 g ?200 mL/hr over 30 Minutes Intravenous Every 24 hours 12/26/21 0926 12/27/21 1421  ? 12/26/21 0000  vancomycin (VANCOREADY) IVPB 1750 mg/350  mL  Status:  Discontinued       ? 1,750 mg ?175 mL/hr over 120 Minutes Intravenous Every 8 hours 12/25/21 1512 12/26/21 1004  ? 12/25/21 2000  ceFAZolin (ANCEF) IVPB 2g/100 mL premix       ? 2 g ?200 mL/hr over 30 Minutes Intravenous Every 8 hours 12/25/21 1613 12/25/21 2130  ? 12/25/21 1600  vancomycin (VANCOREADY) IVPB 2000 mg/400 mL       ? 2,000 mg ?200 mL/hr over 120 Minutes Intravenous  Once 12/25/21 1512 12/25/21 1840  ? 12/25/21 1530  ceFAZolin (ANCEF) IVPB 2g/100 mL premix  Status:  Discontinued       ? 2 g ?200 mL/hr over 30 Minutes Intravenous Every 8 hours 12/25/21 1442 12/25/21 1613  ? 12/25/21 1146  ceFAZolin (ANCEF) 1-4 GM/50ML-% IVPB       ?Note to Pharmacy: Tamsen Snider M: cabinet override  ?    12/25/21 1146 12/25/21 2359  ? 12/25/21 0930  ceFAZolin (ANCEF) IVPB 2g/100 mL premix       ? 2 g ?200 mL/hr over 30 Minutes Intravenous On call to O.R. 12/25/21  0922 12/25/21 1226  ? ?  ?. ? ?Patient was given sequential compression devices, early ambulation, and aspirin for DVT prophylaxis. ? ?Recent vital signs: Patient Vitals for the past 24 hrs: ? BP Temp Temp src Pulse Resp SpO2  ?12/29/21 0700 (!) 147/91 98.1 ?F (36.7 ?C) Oral 97 18 97 %  ?12/29/21 0452 112/61 98.1 ?F (36.7 ?C) -- 77 18 97 %  ?12/28/21 2109 (!) 113/47 97.9 ?F (36.6 ?C) Oral 86 16 100 %  ?12/28/21 1549 125/61 97.7 ?F (36.5 ?C) Oral 82 18 98 %  ?. ? ?Recent laboratory studies: [place] EKG SITE RITE ? ?Result Date: 12/28/2021 ?If [employer] not attached, placement could not be confirmed due to current cardiac rhythm. ? ?[place] EKG SITE RITE ? ?Result Date: 12/28/2021 ?If [employer] not attached, placement could not be confirmed due to current cardiac rhythm.  ? ?Discharge Medications:   ?Allergies as of 12/29/2021   ? ?   Reactions  ? Pollen Extract Other (See Comments)  ? Runny nose, watery eyes, sneezing  ? ?  ? ?  ?Medication List  ?  ? ?STOP taking these medications   ? ?oxyCODONE-acetaminophen 10-325 MG tablet ?Commonly known as: PERCOCET ?Replaced by: oxyCODONE-acetaminophen 5-325 MG tablet ?  ? ?  ? ?TAKE these medications   ? ?amoxicillin 500 MG capsule ?Commonly known as: AMOXIL ?Take 2 capsules (1,000 mg total) by mouth 3 (three) times daily. ?  ?atorvastatin 80 MG tablet ?Commonly known as: LIPITOR ?Take 1 tablet (80 mg total) by mouth daily. ?What changed: when to take this ?  ?caspofungin  IVPB ?Commonly known as: CANCIDAS ?Inject 50 mg into the vein daily for 21 days. Indication:  L BKA Abscess ?First Dose: Yes ?Last Day of Therapy:  01/18/22 ?Labs - Once weekly:  CBC/D and BMP, ?Labs - Every other week:  ESR and CRP ?Method of administration: Elastomeric ?Method of administration may be changed at the discretion of home infusion pharmacist based upon assessment of the patient and/or caregiver's ability to self-administer the medication ordered. ?  ?cephALEXin 500 MG capsule ?Commonly known as:  Keflex ?Take 1 capsule (500 mg total) by mouth 3 (three) times daily. ?  ?ertapenem  IVPB ?Commonly known as: INVANZ ?Inject 1 g into the vein daily. Indication:  L BKA Abscess ?First Dose: Yes ?Last Day of Therapy:  02/06/22 ?  Labs - Once weekly:  CBC/D and BMP, ?Labs - Every other week:  ESR and CRP ?Method of administration: Mini-Bag Plus / Gravity ?Method of administration may be changed at the discretion of home infusion pharmacist based upon assessment of the patient and/or caregiver's ability to self-administer the medication ordered. ?  ?feeding supplement (PRO-STAT SUGAR FREE 64) Liqd ?Take 30 mLs by mouth 3 (three) times daily with meals. ?  ?gabapentin 300 MG capsule ?Commonly known as: NEURONTIN ?Take 2 capsules (600 mg total) by mouth 2 (two) times daily. ?  ?lisinopril 10 MG tablet ?Commonly known as: ZESTRIL ?Take 1 tablet by mouth once daily ?What changed: how much to take ?  ?metFORMIN 500 MG tablet ?Commonly known as: GLUCOPHAGE ?Take 1 tablet (500 mg total) by mouth 2 (two) times daily with a meal. ?  ?metoprolol tartrate 25 MG tablet ?Commonly known as: LOPRESSOR ?Take 1 tablet by mouth twice daily ?  ?oxyCODONE-acetaminophen 5-325 MG tablet ?Commonly known as: PERCOCET/ROXICET ?Take 1 tablet by mouth every 4 (four) hours as needed. ?Replaces: oxyCODONE-acetaminophen 10-325 MG tablet ?  ?pantoprazole 40 MG tablet ?Commonly known as: PROTONIX ?Take 1 tablet by mouth once daily ?  ?sertraline 50 MG tablet ?Commonly known as: ZOLOFT ?Take 1 tablet (50 mg total) by mouth daily. ?  ?sulfamethoxazole-trimethoprim 800-160 MG tablet ?Commonly known as: BACTRIM DS ?Take 1 tablet by mouth 2 (two) times daily. ?  ?warfarin 10 MG tablet ?Commonly known as: COUMADIN ?Take as directed. If you are unsure how to take this medication, talk to your nurse or doctor. ?Original instructions: TAKE 2 TABLETS BY MOUTH ONCE DAILY AT  4  PM ?  ? ?  ? ?  ?  ? ? ?  ?Discharge Care Instructions  ?(From admission, onward)  ?   ? ? ?  ? ?  Start     Ordered  ? 12/29/21 0000  Change dressing on IV access line weekly and PRN  (Home infusion instructions - [facility] )       ? 12/29/21 0719  ? 12/29/21 0000  Change dressing

## 2021-12-29 NOTE — Progress Notes (Signed)
ANTICOAGULATION CONSULT NOTE ? ?Pharmacy Consult for Warfarin ?Indication:  mechanical aortic valve ? ?Allergies  ?Allergen Reactions  ? Pollen Extract Other (See Comments)  ?  Runny nose, watery eyes, sneezing  ? ? ?Patient Measurements: ?Height: 5\' 10"  (177.8 cm) ?Weight: (!) 156.5 kg (345 lb) ?IBW/kg (Calculated) : 73 ? ?Vital Signs: ?Temp: 98.1 ?F (36.7 ?C) (04/04 0700) ?Temp Source: Oral (04/04 0700) ?BP: 147/91 (04/04 0700) ?Pulse Rate: 97 (04/04 0700) ? ?Labs: ?Recent Labs  ?  12/27/21 ?0208 12/28/21 ?0116 12/29/21 ?0128  ?HGB 7.7*  --   --   ?HCT 24.8*  --   --   ?PLT 230  --   --   ?LABPROT 19.9* 20.7* 21.1*  ?INR 1.7* 1.8* 1.8*  ?CREATININE 0.79  --   --   ? ? ?Estimated Creatinine Clearance: 175.5 mL/min (by C-G formula based on SCr of 0.79 mg/dL). ? ? ?PTA Warfarin Regimen:  ?- per 12/23/21 [facility] Note: 5 mg every Mon, Wed, Fri; 10 mg all other days ? ?Assessment: ?46 yo male with h/o mechanical aortic valve replacement on warfarin PTA. Patient now post-op revision of left BKA and pharmacy consulted to resume warfarin. ? ?CBC 4/02: hgb 7.7 (down from 8.2), plts 230 (down from 270). No s/sx bleeding reported. INR today at 1.8, plateau/little change from previous days INR (1.7 and 1.8 respectively). Will continue 10mg  x1 again and trend INR and CBC. ? ?Goal of Therapy:  ?INR 2-3 ?Monitor platelets by anticoagulation protocol: Yes ?  ?Plan:  ?Give warfarin 10 mg PO x1 dose ?Check INR daily while on warfarin ?Continue to monitor H&H and platelets ? ?If discharging, can consider continuing warfarin 10mg  daily until follow-up with [facility] clinic outpatient within the upcoming week. ? ? ? ?Thank you for allowing pharmacy to be a part of this patient?s care. ? ?Ardyth Harps, PharmD ?Clinical Pharmacist ? ? ?

## 2021-12-29 NOTE — Progress Notes (Signed)
AVS provided to pt. All questions answered at this time. PT placed on prevena wound vac and instruction provided.  ?

## 2021-12-29 NOTE — Progress Notes (Signed)
Patient ID: Adam Long, male   DOB: 12-Aug-1976, 46 y.o.   MRN: [id number] ?No complaints this am, 300 cc in vac. Plan for discharge on PICC line antibiotics. Thank you for ID consult ?

## 2021-12-30 ENCOUNTER — Telehealth: Payer: Self-pay | Admitting: Orthopedic Surgery

## 2021-12-30 NOTE — Telephone Encounter (Signed)
Adam Long 9CS) called with an FYI. Cant flush pick line of pt to start antibiotics. Will start tomorrow when new pick line inserted. Any questions call Victorino Dike at [phone]. ?

## 2021-12-30 NOTE — Progress Notes (Signed)
Normal diabetic eye exam, repeat in 1 year

## 2021-12-30 NOTE — Telephone Encounter (Signed)
Noted  

## 2021-12-31 ENCOUNTER — Ambulatory Visit (INDEPENDENT_AMBULATORY_CARE_PROVIDER_SITE_OTHER): Payer: 59 | Admitting: *Deleted

## 2021-12-31 DIAGNOSIS — Z952 Presence of prosthetic heart valve: Secondary | ICD-10-CM

## 2021-12-31 DIAGNOSIS — Z5181 Encounter for therapeutic drug level monitoring: Secondary | ICD-10-CM

## 2021-12-31 LAB — POCT INR: INR: 1.8 — AB (ref 2.0–3.0)

## 2021-12-31 NOTE — Patient Instructions (Signed)
Take 1 1/2 tablets today then resume 1 tablet daily except 1/2 tablet on Mondays, Wednesdays and Fridays ?On IV Abx and Keflex ?Recheck in 1 wk ?

## 2022-01-04 ENCOUNTER — Ambulatory Visit (INDEPENDENT_AMBULATORY_CARE_PROVIDER_SITE_OTHER): Payer: 59 | Admitting: *Deleted

## 2022-01-04 DIAGNOSIS — Z952 Presence of prosthetic heart valve: Secondary | ICD-10-CM | POA: Diagnosis not present

## 2022-01-04 DIAGNOSIS — Z5181 Encounter for therapeutic drug level monitoring: Secondary | ICD-10-CM

## 2022-01-04 LAB — POCT INR: INR: 2.1 (ref 2.0–3.0)

## 2022-01-04 NOTE — Patient Instructions (Signed)
Continue warfarin 1 tablet daily except 1/2 tablet on Mondays, Wednesdays and Fridays ?On IV Abx and Keflex ?Recheck in 1 wk ?

## 2022-01-06 ENCOUNTER — Encounter: Payer: Self-pay | Admitting: Family

## 2022-01-06 ENCOUNTER — Encounter (HOSPITAL_COMMUNITY): Payer: Self-pay

## 2022-01-06 ENCOUNTER — Other Ambulatory Visit: Payer: Self-pay

## 2022-01-06 ENCOUNTER — Ambulatory Visit (INDEPENDENT_AMBULATORY_CARE_PROVIDER_SITE_OTHER): Payer: 59 | Admitting: Family

## 2022-01-06 ENCOUNTER — Emergency Department (HOSPITAL_COMMUNITY)
Admission: EM | Admit: 2022-01-06 | Discharge: 2022-01-06 | Disposition: A | Discharge status: Home / Self Care | Payer: 59 | Attending: Student | Admitting: Student

## 2022-01-06 DIAGNOSIS — Z7901 Long term (current) use of anticoagulants: Secondary | ICD-10-CM | POA: Insufficient documentation

## 2022-01-06 DIAGNOSIS — D649 Anemia, unspecified: Secondary | ICD-10-CM | POA: Insufficient documentation

## 2022-01-06 DIAGNOSIS — Z79899 Other long term (current) drug therapy: Secondary | ICD-10-CM | POA: Diagnosis not present

## 2022-01-06 DIAGNOSIS — E1165 Type 2 diabetes mellitus with hyperglycemia: Secondary | ICD-10-CM | POA: Diagnosis not present

## 2022-01-06 DIAGNOSIS — R791 Abnormal coagulation profile: Secondary | ICD-10-CM | POA: Insufficient documentation

## 2022-01-06 DIAGNOSIS — Z7984 Long term (current) use of oral hypoglycemic drugs: Secondary | ICD-10-CM | POA: Diagnosis not present

## 2022-01-06 DIAGNOSIS — R799 Abnormal finding of blood chemistry, unspecified: Secondary | ICD-10-CM | POA: Diagnosis present

## 2022-01-06 DIAGNOSIS — I2510 Atherosclerotic heart disease of native coronary artery without angina pectoris: Secondary | ICD-10-CM | POA: Diagnosis not present

## 2022-01-06 DIAGNOSIS — Z89612 Acquired absence of left leg above knee: Secondary | ICD-10-CM | POA: Insufficient documentation

## 2022-01-06 DIAGNOSIS — S88112A Complete traumatic amputation at level between knee and ankle, left lower leg, initial encounter: Secondary | ICD-10-CM

## 2022-01-06 DIAGNOSIS — I10 Essential (primary) hypertension: Secondary | ICD-10-CM | POA: Diagnosis not present

## 2022-01-06 DIAGNOSIS — Z89512 Acquired absence of left leg below knee: Secondary | ICD-10-CM

## 2022-01-06 LAB — VITAMIN B12: Vitamin B-12: 266 pg/mL (ref 180–914)

## 2022-01-06 LAB — CBC WITH DIFFERENTIAL/PLATELET
Abs Immature Granulocytes: 0.39 10*3/uL — ABNORMAL HIGH (ref 0.00–0.07)
Basophils Absolute: 0.1 10*3/uL (ref 0.0–0.1)
Basophils Relative: 1 %
Eosinophils Absolute: 0.6 10*3/uL — ABNORMAL HIGH (ref 0.0–0.5)
Eosinophils Relative: 6 %
HCT: 24.8 % — ABNORMAL LOW (ref 39.0–52.0)
Hemoglobin: 7.1 g/dL — ABNORMAL LOW (ref 13.0–17.0)
Immature Granulocytes: 4 %
Lymphocytes Relative: 19 %
Lymphs Abs: 2 10*3/uL (ref 0.7–4.0)
MCH: 21.8 pg — ABNORMAL LOW (ref 26.0–34.0)
MCHC: 28.6 g/dL — ABNORMAL LOW (ref 30.0–36.0)
MCV: 76.1 fL — ABNORMAL LOW (ref 80.0–100.0)
Monocytes Absolute: 0.7 10*3/uL (ref 0.1–1.0)
Monocytes Relative: 6 %
Neutro Abs: 6.7 10*3/uL (ref 1.7–7.7)
Neutrophils Relative %: 64 %
Platelets: 342 10*3/uL (ref 150–400)
RBC: 3.26 MIL/uL — ABNORMAL LOW (ref 4.22–5.81)
RDW: 16.7 % — ABNORMAL HIGH (ref 11.5–15.5)
WBC: 10.4 10*3/uL (ref 4.0–10.5)
nRBC: 0.4 % — ABNORMAL HIGH (ref 0.0–0.2)

## 2022-01-06 LAB — BASIC METABOLIC PANEL
Anion gap: 5 (ref 5–15)
BUN: 12 mg/dL (ref 6–20)
CO2: 26 mmol/L (ref 22–32)
Calcium: 8.6 mg/dL — ABNORMAL LOW (ref 8.9–10.3)
Chloride: 106 mmol/L (ref 98–111)
Creatinine, Ser: 0.67 mg/dL (ref 0.61–1.24)
GFR, Estimated: >60 mL/min (ref >60)
Glucose, Bld: 163 mg/dL — ABNORMAL HIGH (ref 70–99)
Potassium: 4.3 mmol/L (ref 3.5–5.1)
Sodium: 137 mmol/L (ref 135–145)

## 2022-01-06 LAB — ABO/RH: ABO/RH(D): A POS

## 2022-01-06 LAB — RETICULOCYTES
Immature Retic Fract: 18.1 % — ABNORMAL HIGH (ref 2.3–15.9)
RBC.: 3.14 MIL/uL — ABNORMAL LOW (ref 4.22–5.81)
Retic Count, Absolute: 52.8 10*3/uL (ref 19.0–186.0)
Retic Ct Pct: 1.7 % (ref 0.4–3.1)

## 2022-01-06 LAB — IRON AND TIBC
Iron: 16 ug/dL — ABNORMAL LOW (ref 45–182)
Saturation Ratios: 4 % — ABNORMAL LOW (ref 17.9–39.5)
TIBC: 405 ug/dL (ref 250–450)
UIBC: 389 ug/dL

## 2022-01-06 LAB — FOLATE: Folate: 6.8 ng/mL (ref >5.9)

## 2022-01-06 LAB — PROTIME-INR
INR: 2.1 — ABNORMAL HIGH (ref 0.8–1.2)
Prothrombin Time: 23.2 seconds — ABNORMAL HIGH (ref 11.4–15.2)

## 2022-01-06 LAB — FERRITIN: Ferritin: 16 ng/mL — ABNORMAL LOW (ref 24–336)

## 2022-01-06 LAB — PREPARE RBC (CROSSMATCH)

## 2022-01-06 LAB — POC OCCULT BLOOD, ED: Fecal Occult Bld: NEGATIVE

## 2022-01-06 MED ORDER — FERROUS SULFATE 325 (65 FE) MG PO TABS: 325.0000 mg | ORAL_TABLET | Freq: Every day | ORAL | 0 refills | Status: DC

## 2022-01-06 MED ORDER — SODIUM CHLORIDE 0.9 % IV SOLN
10.0000 mL/h | Freq: Once | INTRAVENOUS | Status: AC
  Administered 2022-01-06: 10 mL/h via INTRAVENOUS

## 2022-01-06 NOTE — ED Triage Notes (Signed)
Patient had labs yesterday by home health and hemoglobin came back low at 6.4 ?

## 2022-01-06 NOTE — Discharge Instructions (Addendum)
You have anemia, likely from slow chronic bleeding from your left leg, your iron levels were low  starting you on an iron pill please take as prescribed. ? ?I would you to follow-up with your PCP in 2 days time for repeat of your hemoglobin. ? ?Come back to the emergency department if you develop chest pain, shortness of breath, severe abdominal pain, uncontrolled nausea, vomiting, diarrhea. ? ?

## 2022-01-06 NOTE — ED Provider Notes (Signed)
?[facility] ?Provider Note ? ? ?CSN: [id number] ?Arrival date & time: 01/06/22  1020 ? ?  ? ?History ? ?Chief Complaint  ?Patient presents with  ? Abnormal Lab  ? ? ?Adam Long is a 46 y.o. male. ? ?HPI ? ?Patient with medical history including obesity, CAD, diabetes, above-the-knee amputation left side aortic valve replacement currently on warfarin presents with complaints of abnormal hemoglobin.  Patient states that his at home nurse drew his blood they noted that his hemoglobin was 6.4, he states that over the last couple days he has felt more fatigued but denies lightheaded dizziness going from sitting to standing position no chest pain or shortness of breath he denies hematochezia or melena denies hematuria epistasis or abnormal bruising.  He has no other complaints. ? ?Reviewed patient's charts received seen by orthopedics for revision of his left knee amputation as a dehisced.  Currently has a PICC line on the right arm where he receives antibiotics. ? ?Home Medications ?Prior to Admission medications   ?Medication Sig Start Date End Date Taking? Authorizing Provider  ?amoxicillin (AMOXIL) 500 MG capsule Take 2 capsules (1,000 mg total) by mouth 3 (three) times daily. 12/28/21 02/06/22 Yes Newt Minion, MD  ?atorvastatin (LIPITOR) 80 MG tablet Take 1 tablet (80 mg total) by mouth daily. ?Patient taking differently: Take 80 mg by mouth every evening. 11/04/20  Yes Noreene Larsson, NP  ?caspofungin (CANCIDAS) IVPB Inject 50 mg into the vein daily for 21 days. Indication:  L BKA Abscess ?First Dose: Yes ?Last Day of Therapy:  01/18/22 ?Labs - Once weekly:  CBC/D and BMP, ?Labs - Every other week:  ESR and CRP ?Method of administration: Elastomeric ?Method of administration may be changed at the discretion of home infusion pharmacist based upon assessment of the patient and/or caregiver's ability to self-administer the medication ordered. 12/29/21 01/19/22 Yes Newt Minion, MD  ?ertapenem  [facility]) IVPB Inject 1 g into the vein daily. Indication:  L BKA Abscess ?First Dose: Yes ?Last Day of Therapy:  02/06/22 ?Labs - Once weekly:  CBC/D and BMP, ?Labs - Every other week:  ESR and CRP ?Method of administration: Mini-Bag Plus / Gravity ?Method of administration may be changed at the discretion of home infusion pharmacist based upon assessment of the patient and/or caregiver's ability to self-administer the medication ordered. 12/29/21 02/07/22 Yes Newt Minion, MD  ?ferrous sulfate 325 (65 FE) MG tablet Take 1 tablet (325 mg total) by mouth daily. 01/06/22  Yes Marcello Fennel, PA-C  ?gabapentin (NEURONTIN) 300 MG capsule Take 2 capsules (600 mg total) by mouth 2 (two) times daily. 12/07/21  Yes Lovorn, Jinny Blossom, MD  ?lisinopril (ZESTRIL) 10 MG tablet Take 1 tablet by mouth once daily ?Patient taking differently: Take 20 mg by mouth daily. 10/22/21  Yes Lovorn, Jinny Blossom, MD  ?metFORMIN (GLUCOPHAGE) 500 MG tablet Take 1 tablet (500 mg total) by mouth 2 (two) times daily with a meal. 10/19/21  Yes Paseda, Dewaine Conger, FNP  ?metoprolol tartrate (LOPRESSOR) 25 MG tablet Take 1 tablet by mouth twice daily 12/15/21  Yes Paseda, Dewaine Conger, FNP  ?oxyCODONE-acetaminophen (PERCOCET/ROXICET) 5-325 MG tablet Take 1 tablet by mouth every 4 (four) hours as needed. 12/29/21  Yes Newt Minion, MD  ?pantoprazole (PROTONIX) 40 MG tablet Take 1 tablet by mouth once daily 12/02/21  Yes Lovorn, Jinny Blossom, MD  ?sertraline (ZOLOFT) 50 MG tablet Take 1 tablet (50 mg total) by mouth daily. 12/07/21  Yes Lovorn, Jinny Blossom, MD  ?  warfarin (COUMADIN) 10 MG tablet TAKE 2 TABLETS BY MOUTH ONCE DAILY AT  4  PM 11/18/21  Yes Lovorn, Megan, MD  ?Amino Acids-Protein Hydrolys (FEEDING SUPPLEMENT, PRO-STAT SUGAR FREE 64,) LIQD Take 30 mLs by mouth 3 (three) times daily with meals. ?Patient not taking: Reported on 12/22/2021 09/14/21   Bary Leriche, PA-C  ?cephALEXin (KEFLEX) 500 MG capsule Take 1 capsule (500 mg total) by mouth 3 (three) times  daily. ?Patient not taking: Reported on 01/06/2022 12/16/21   Suzan Slick, NP  ?sulfamethoxazole-trimethoprim (BACTRIM DS) 800-160 MG tablet Take 1 tablet by mouth 2 (two) times daily. ?Patient not taking: Reported on 01/06/2022 12/16/21   Suzan Slick, NP  ?   ? ?Allergies    ?Pollen extract   ? ?Review of Systems   ?Review of Systems  ?Constitutional:  Positive for fatigue. Negative for chills and fever.  ?Respiratory:  Negative for shortness of breath.   ?Cardiovascular:  Negative for chest pain.  ?Gastrointestinal:  Negative for abdominal pain.  ?Neurological:  Negative for headaches.  ? ?Physical Exam ?Updated Vital Signs ?BP 130/69   Pulse 82   Temp 98.2 ?F (36.8 ?C) (Axillary)   Resp 18   Ht '5\' 10"'  (1.778 m)   Wt (!) 156.5 kg   SpO2 99%   BMI 49.50 kg/m?  ?Physical Exam ?Vitals and nursing note reviewed. Exam conducted with a chaperone present.  ?Constitutional:   ?   General: He is not in acute distress. ?   Appearance: He is not ill-appearing.  ?HENT:  ?   Head: Normocephalic and atraumatic.  ?   Nose: No congestion.  ?Eyes:  ?   Extraocular Movements: Extraocular movements intact.  ?   Conjunctiva/sclera: Conjunctivae normal.  ?   Pupils: Pupils are equal, round, and reactive to light.  ?Cardiovascular:  ?   Rate and Rhythm: Normal rate and regular rhythm.  ?   Pulses: Normal pulses.  ?   Heart sounds: No murmur heard. ?  No friction rub. No gallop.  ?Pulmonary:  ?   Effort: No respiratory distress.  ?   Breath sounds: No wheezing, rhonchi or rales.  ?Genitourinary: ?   Comments: With chaperone present rectal exam was performed no external hemorrhoids, no palpable internal hemorrhoids, Hemoccult was negative no melena or hematochezia. ?Musculoskeletal:  ?   Comments: Left above-knee amputation no evidence of infection  ?Skin: ?   General: Skin is warm and dry.  ?   Comments: Patient is no PICC line in the right arm no evidence of infection ? ?Appears slightly pale but no petechia or ecchymosis  present.  ?Neurological:  ?   Mental Status: He is alert.  ?Psychiatric:     ?   Mood and Affect: Mood normal.  ? ? ?ED Results / Procedures / Treatments   ?Labs ?(all labs ordered are listed, but only abnormal results are displayed) ?Labs Reviewed  ?BASIC METABOLIC PANEL - Abnormal; Notable for the following components:  ?    Result Value  ? Glucose, Bld 163 (*)   ? Calcium 8.6 (*)   ? All other components within normal limits  ?CBC WITH DIFFERENTIAL/PLATELET - Abnormal; Notable for the following components:  ? RBC 3.26 (*)   ? Hemoglobin 7.1 (*)   ? HCT 24.8 (*)   ? MCV 76.1 (*)   ? MCH 21.8 (*)   ? MCHC 28.6 (*)   ? RDW 16.7 (*)   ? nRBC 0.4 (*)   ?  Eosinophils Absolute 0.6 (*)   ? Abs Immature Granulocytes 0.39 (*)   ? All other components within normal limits  ?PROTIME-INR - Abnormal; Notable for the following components:  ? Prothrombin Time 23.2 (*)   ? INR 2.1 (*)   ? All other components within normal limits  ?IRON AND TIBC - Abnormal; Notable for the following components:  ? Iron 16 (*)   ? Saturation Ratios 4 (*)   ? All other components within normal limits  ?FERRITIN - Abnormal; Notable for the following components:  ? Ferritin 16 (*)   ? All other components within normal limits  ?RETICULOCYTES - Abnormal; Notable for the following components:  ? RBC. 3.14 (*)   ? Immature Retic Fract 18.1 (*)   ? All other components within normal limits  ?VITAMIN B12  ?FOLATE  ?POC OCCULT BLOOD, ED  ?TYPE AND SCREEN  ?PREPARE RBC (CROSSMATCH)  ?ABO/RH  ? ? ?EKG ?None ? ?Radiology ?No results found. ? ?Procedures ?Marland KitchenCritical Care ?Performed by: Marcello Fennel, PA-C ?Authorized by: Marcello Fennel, PA-C  ? ?Critical care provider statement:  ?  Critical care time (minutes):  30 ?  Critical care was necessary to treat or prevent imminent or life-threatening deterioration of the following conditions:  Circulatory failure ?  Critical care was time spent personally by me on the following activities:  Development of  treatment plan with patient or surrogate, discussions with consultants, evaluation of patient's response to treatment, examination of patient, ordering and review of laboratory studies, ordering and review of radiographic studies

## 2022-01-06 NOTE — Progress Notes (Signed)
? ?Post-Op Visit Note ?  ?Patient: Adam Long           ?Date of Birth: 12-14-1975           ?MRN: [id number] ?Visit Date: 01/06/2022 ?PCP: Pcp, No ? ?Chief Complaint: No chief complaint on file. ? ? ?HPI:  ?HPI ?The patient is a 46 year old gentleman who presents 1 week status post revision left below-knee amputation ? ?He does have home health assistance.  He is on Xarelto.  He states home health drew blood yesterday and that his clinic hemoglobin was at 6 ? ?Confirmed this with [facility].  Was 6.9. ?Ortho Exam ?On examination of the left residual limb his incision is approximated with sutures there is no proud hematoma.  There is scant serosanguineous drainage.  There is no erythema or warmth. ? ?Visit Diagnoses: No diagnosis found. ? ?Plan: He will begin daily Dial soap cleansing.  Dry dressing changes.  Shrinker over this.  He will present to the ED for transfusion ? ?Follow-Up Instructions: No follow-ups on file.  ? ?Imaging: ?No results found. ? ?Orders:  ?No orders of the defined types were placed in this encounter. ? ?No orders of the defined types were placed in this encounter. ? ? ? ?PMFS History: ?Patient Active Problem List  ? Diagnosis Date Noted  ? S/P BKA (below knee amputation), left (HCC) 10/30/2021  ? History of below knee amputation, left (HCC) 10/30/2021  ? Dehiscence of amputation stump of left lower extremity (HCC)   ? Below-knee amputation of left lower extremity (HCC) 09/02/2021  ? Severe protein-calorie malnutrition (HCC)   ? MSSA bacteremia   ? Ulcer of left foot (HCC)   ? Subacute osteomyelitis, left ankle and foot (HCC)   ? Diabetic foot infection (HCC) 02/14/2021  ? Diabetic ulcer of left foot associated with type 1 diabetes mellitus, limited to breakdown of skin (HCC)   ? Leukocytosis 02/13/2021  ? Hyponatremia 02/13/2021  ? Normocytic anemia 02/13/2021  ? Hyperglycemia due to diabetes mellitus (HCC) 02/13/2021  ? Open wound of left foot 02/12/2021  ? Left leg swelling  02/02/2021  ? History of complete ray amputation of fifth toe of left foot (HCC) 11/04/2020  ? Immunization due 11/04/2020  ? Anxiety 09/30/2020  ? Preventative health care 12/13/2019  ? S/P aortic valve replacement with mechanical valve 12/05/2019  ? S/P AVR (aortic valve replacement) 12/05/2019  ? Syncope 11/10/2019  ? Personal history of noncompliance with medical treatment, presenting hazards to health 03/16/2018  ? Uncontrolled diabetes mellitus with hyperglycemia, without long-term current use of insulin (HCC) 10/12/2015  ? Hyperlipidemia associated with type 2 diabetes mellitus (HCC) 10/12/2015  ? Morbid obesity (HCC) 10/12/2015  ? Chest pain 07/02/2013  ? HTN (hypertension) 07/02/2013  ? ?Past Medical History:  ?Diagnosis Date  ? Anxiety   ? Aortic stenosis   ? Cellulitis and abscess of lower extremity 06/11/2019  ? Cellulitis of fourth toe of left foot   ? Cholelithiasis   ? Coronary artery disease   ? Nonobstructive CAD (40-50% LAD) 08/2019  ? Depression   ? Diabetes mellitus without complication (HCC)   ? Phreesia 09/27/2020  ? Elevated troponin level not due myocardial infarction 11/11/2019  ? Essential hypertension   ? Gangrene of toe of left foot (HCC) 07/06/2019  ? Heart murmur   ? Phreesia 09/27/2020  ? Hyperlipidemia   ? Phreesia 09/27/2020  ? Hypertension   ? Phreesia 09/27/2020  ? Mixed hyperlipidemia   ? Morbid obesity (HCC)   ?  S/P aortic valve replacement with mechanical valve 12/05/2019  ? 25 mm Carbomedics top hat bileaflet mechanical valve via partial upper hemi-sternotomy  ? Severe aortic stenosis 09/24/2019  ? Type 2 diabetes mellitus (HCC)   ?  ?Family History  ?Problem Relation Age of Onset  ? Cancer Mother   ?     Brain  ? Heart disease Father   ? Hyperlipidemia Father   ? Hypertension Father   ? Stroke Father   ? Heart murmur Sister   ?  ?Past Surgical History:  ?Procedure Laterality Date  ? ABDOMINAL AORTOGRAM W/LOWER EXTREMITY N/A 07/06/2019  ? Procedure: ABDOMINAL AORTOGRAM W/LOWER  EXTREMITY;  Surgeon: Sherren Kerns, MD;  Location: [facility] INVASIVE CV LAB;  Service: Cardiovascular;  Laterality: N/A;  Bilateral  ? AMPUTATION Left 07/09/2019  ? Procedure: LEFT FOURTH and Fifth TOE AMPUTATION.;  Surgeon: Larina Earthly, MD;  Location: [facility] OR;  Service: Vascular;  Laterality: Left;  ? AMPUTATION Left 03/11/2021  ? Procedure: LEFT FOOT 5TH  AND 4TH RAY AMPUTATION;  Surgeon: Nadara Mustard, MD;  Location: [facility] OR;  Service: Orthopedics;  Laterality: Left;  ? AMPUTATION Left 08/28/2021  ? Procedure: AMPUTATION BELOW KNEE;  Surgeon: Nadara Mustard, MD;  Location: [facility] OR;  Service: Orthopedics;  Laterality: Left;  ? AORTIC VALVE REPLACEMENT N/A 12/05/2019  ? Procedure: PARTIAL STERNOTOMY FOR AORTIC VALVE REPLACEMENT (AVR), USING CARBOMEDICS SUPRA-ANNULAR TOP HAT ;  Surgeon: Purcell Nails, MD;  Location: [facility] OR;  Service: Open Heart Surgery;  Laterality: N/A;  No neck lines on left  ? APPLICATION OF WOUND VAC Left 10/30/2021  ? Procedure: APPLICATION OF WOUND VAC;  Surgeon: Nadara Mustard, MD;  Location: [facility] OR;  Service: Orthopedics;  Laterality: Left;  ? APPLICATION OF WOUND VAC Left 12/25/2021  ? Procedure: APPLICATION OF WOUND VAC;  Surgeon: Nadara Mustard, MD;  Location: [facility] OR;  Service: Orthopedics;  Laterality: Left;  ? CARDIAC VALVE REPLACEMENT N/A   ? Phreesia 09/27/2020  ? IR RADIOLOGY PERIPHERAL GUIDED IV START  10/05/2019  ? IR US GUIDE VASC ACCESS RIGHT  10/05/2019  ? MULTIPLE EXTRACTIONS WITH ALVEOLOPLASTY N/A 10/26/2019  ? Procedure: EXTRACTION OF TOOTH #'S 3, 5-11,19-28,  AND 32 WITH ALVEOLOPLASTY;  Surgeon: Charlynne Pander, DDS;  Location: [facility] OR;  Service: Oral Surgery;  Laterality: N/A;  ? RIGHT HEART CATH AND CORONARY ANGIOGRAPHY N/A 09/24/2019  ? Procedure: RIGHT HEART CATH AND CORONARY ANGIOGRAPHY;  Surgeon: Tonny Bollman, MD;  Location: [facility] INVASIVE CV LAB;  Service: Cardiovascular;  Laterality: N/A;  ? STUMP REVISION Left 10/30/2021  ? Procedure: REVISION LEFT BELOW KNEE AMPUTATION;   Surgeon: Nadara Mustard, MD;  Location: [facility] OR;  Service: Orthopedics;  Laterality: Left;  ? STUMP REVISION Left 12/25/2021  ? Procedure: REVISION LEFT BELOW KNEE AMPUTATION;  Surgeon: Nadara Mustard, MD;  Location: [facility] OR;  Service: Orthopedics;  Laterality: Left;  ? TEE WITHOUT CARDIOVERSION N/A 12/05/2019  ? Procedure: TRANSESOPHAGEAL ECHOCARDIOGRAM (TEE);  Surgeon: Purcell Nails, MD;  Location: [facility] OR;  Service: Open Heart Surgery;  Laterality: N/A;  ? TEE WITHOUT CARDIOVERSION N/A 09/01/2021  ? Procedure: TRANSESOPHAGEAL ECHOCARDIOGRAM (TEE);  Surgeon: Little Ishikawa, MD;  Location: [facility] ENDOSCOPY;  Service: Cardiovascular;  Laterality: N/A;  ? ?Social History  ? ?Occupational History  ?  Comment: Glass blower/designer- self-employed  ?Tobacco Use  ? Smoking status: Never  ? Smokeless tobacco: Former  ?  Types: Chew  ?  Quit date: 2021  ?Vaping Use  ?  Vaping Use: Never used  ?Substance and Sexual Activity  ? Alcohol use: Yes  ?  Alcohol/week: 4.0 standard drinks  ?  Types: 4 Cans of beer per week  ?  Comment: occasionally  ? Drug use: No  ? Sexual activity: Yes  ?  Birth control/protection: None  ? ? ?

## 2022-01-07 LAB — TYPE AND SCREEN
ABO/RH(D): A POS
Antibody Screen: NEGATIVE
Unit division: 0

## 2022-01-07 LAB — BPAM RBC
Blood Product Expiration Date: 202304252359
ISSUE DATE / TIME: 202304121545
Unit Type and Rh: 6200

## 2022-01-13 ENCOUNTER — Ambulatory Visit (INDEPENDENT_AMBULATORY_CARE_PROVIDER_SITE_OTHER): Payer: 59 | Admitting: *Deleted

## 2022-01-13 DIAGNOSIS — Z5181 Encounter for therapeutic drug level monitoring: Secondary | ICD-10-CM

## 2022-01-13 DIAGNOSIS — Z952 Presence of prosthetic heart valve: Secondary | ICD-10-CM | POA: Diagnosis not present

## 2022-01-13 LAB — POCT INR: INR: 4.4 — AB (ref 2.0–3.0)

## 2022-01-13 NOTE — Patient Instructions (Signed)
Hold warfarin tonight then decrease dose to 1/2 tablet daily except 1 tablet on Tuesdays and Fridays ?On IV Abx and Amoxicillin ?Recheck in 1 wk ?

## 2022-01-14 ENCOUNTER — Ambulatory Visit (INDEPENDENT_AMBULATORY_CARE_PROVIDER_SITE_OTHER): Payer: 59 | Admitting: Family

## 2022-01-14 ENCOUNTER — Encounter: Payer: Self-pay | Admitting: Family

## 2022-01-14 DIAGNOSIS — S88112A Complete traumatic amputation at level between knee and ankle, left lower leg, initial encounter: Secondary | ICD-10-CM

## 2022-01-14 DIAGNOSIS — T8781 Dehiscence of amputation stump: Secondary | ICD-10-CM

## 2022-01-14 DIAGNOSIS — Z89512 Acquired absence of left leg below knee: Secondary | ICD-10-CM

## 2022-01-14 NOTE — Progress Notes (Signed)
? ?Post-Op Visit Note ?  ?Patient: Adam Long           ?Date of Birth: 04/30/1976           ?MRN: [id number] ?Visit Date: 01/14/2022 ?PCP: Donell Beers, FNP ? ?Chief Complaint:  ?Chief Complaint  ?Patient presents with  ? Left Leg - Routine Post Op  ?  12/25/2021 revision left BKA   ? ? ?HPI:  ?HPI ?The patient is a 46 year old gentleman seen status post revision of his left below-knee amputation on March 31 since his last visit he did have a transfusion his hemoglobin had gotten down to 6.9.  He is currently taking warfarin yesterday his INR was up to 4.1.  His dose was adjusted. ? ?He has been doing dry dressing changes and wearing a shrinker over this he does continue to have moderate bloody drainage and some swelling but overall is pleased that he is doing much better than he has after his last 2 amputations ?Ortho Exam ?On examination of the left residual limb the sutures are still in place there is no gaping there is minimal to moderate drainage from the center of his incision there is no impending dehiscence no hematoma overall this is consolidating well. ? ?Visit Diagnoses: No diagnosis found. ? ?Plan: Quite pleased with the healing we will wait 1 more week to harvest his sutures he will continue daily Dial soap cleansing dry dressings shrinker for compression ? ?Follow-Up Instructions: No follow-ups on file.  ? ?Imaging: ?No results found. ? ?Orders:  ?No orders of the defined types were placed in this encounter. ? ?No orders of the defined types were placed in this encounter. ? ? ? ?PMFS History: ?Patient Active Problem List  ? Diagnosis Date Noted  ? S/P BKA (below knee amputation), left (HCC) 10/30/2021  ? History of below knee amputation, left (HCC) 10/30/2021  ? Dehiscence of amputation stump of left lower extremity (HCC)   ? Below-knee amputation of left lower extremity (HCC) 09/02/2021  ? Severe protein-calorie malnutrition (HCC)   ? MSSA bacteremia   ? Ulcer of left foot (HCC)   ?  Subacute osteomyelitis, left ankle and foot (HCC)   ? Diabetic foot infection (HCC) 02/14/2021  ? Diabetic ulcer of left foot associated with type 1 diabetes mellitus, limited to breakdown of skin (HCC)   ? Leukocytosis 02/13/2021  ? Hyponatremia 02/13/2021  ? Normocytic anemia 02/13/2021  ? Hyperglycemia due to diabetes mellitus (HCC) 02/13/2021  ? Open wound of left foot 02/12/2021  ? Left leg swelling 02/02/2021  ? History of complete ray amputation of fifth toe of left foot (HCC) 11/04/2020  ? Immunization due 11/04/2020  ? Anxiety 09/30/2020  ? Preventative health care 12/13/2019  ? S/P aortic valve replacement with mechanical valve 12/05/2019  ? S/P AVR (aortic valve replacement) 12/05/2019  ? Syncope 11/10/2019  ? Personal history of noncompliance with medical treatment, presenting hazards to health 03/16/2018  ? Uncontrolled diabetes mellitus with hyperglycemia, without long-term current use of insulin (HCC) 10/12/2015  ? Hyperlipidemia associated with type 2 diabetes mellitus (HCC) 10/12/2015  ? Morbid obesity (HCC) 10/12/2015  ? Chest pain 07/02/2013  ? HTN (hypertension) 07/02/2013  ? ?Past Medical History:  ?Diagnosis Date  ? Anxiety   ? Aortic stenosis   ? Cellulitis and abscess of lower extremity 06/11/2019  ? Cellulitis of fourth toe of left foot   ? Cholelithiasis   ? Coronary artery disease   ? Nonobstructive CAD (40-50% LAD) 08/2019  ?  Depression   ? Diabetes mellitus without complication (HCC)   ? Phreesia 09/27/2020  ? Elevated troponin level not due myocardial infarction 11/11/2019  ? Essential hypertension   ? Gangrene of toe of left foot (HCC) 07/06/2019  ? Heart murmur   ? Phreesia 09/27/2020  ? Hyperlipidemia   ? Phreesia 09/27/2020  ? Hypertension   ? Phreesia 09/27/2020  ? Mixed hyperlipidemia   ? Morbid obesity (HCC)   ? S/P aortic valve replacement with mechanical valve 12/05/2019  ? 25 mm Carbomedics top hat bileaflet mechanical valve via partial upper hemi-sternotomy  ? Severe aortic  stenosis 09/24/2019  ? Type 2 diabetes mellitus (HCC)   ?  ?Family History  ?Problem Relation Age of Onset  ? Cancer Mother   ?     Brain  ? Heart disease Father   ? Hyperlipidemia Father   ? Hypertension Father   ? Stroke Father   ? Heart murmur Sister   ?  ?Past Surgical History:  ?Procedure Laterality Date  ? ABDOMINAL AORTOGRAM W/LOWER EXTREMITY N/A 07/06/2019  ? Procedure: ABDOMINAL AORTOGRAM W/LOWER EXTREMITY;  Surgeon: Sherren Kerns, MD;  Location: [facility] INVASIVE CV LAB;  Service: Cardiovascular;  Laterality: N/A;  Bilateral  ? AMPUTATION Left 07/09/2019  ? Procedure: LEFT FOURTH and Fifth TOE AMPUTATION.;  Surgeon: Larina Earthly, MD;  Location: [facility] OR;  Service: Vascular;  Laterality: Left;  ? AMPUTATION Left 03/11/2021  ? Procedure: LEFT FOOT 5TH  AND 4TH RAY AMPUTATION;  Surgeon: Nadara Mustard, MD;  Location: [facility] OR;  Service: Orthopedics;  Laterality: Left;  ? AMPUTATION Left 08/28/2021  ? Procedure: AMPUTATION BELOW KNEE;  Surgeon: Nadara Mustard, MD;  Location: [facility] OR;  Service: Orthopedics;  Laterality: Left;  ? AORTIC VALVE REPLACEMENT N/A 12/05/2019  ? Procedure: PARTIAL STERNOTOMY FOR AORTIC VALVE REPLACEMENT (AVR), USING CARBOMEDICS SUPRA-ANNULAR TOP HAT ;  Surgeon: Purcell Nails, MD;  Location: [facility] OR;  Service: Open Heart Surgery;  Laterality: N/A;  No neck lines on left  ? APPLICATION OF WOUND VAC Left 10/30/2021  ? Procedure: APPLICATION OF WOUND VAC;  Surgeon: Nadara Mustard, MD;  Location: [facility] OR;  Service: Orthopedics;  Laterality: Left;  ? APPLICATION OF WOUND VAC Left 12/25/2021  ? Procedure: APPLICATION OF WOUND VAC;  Surgeon: Nadara Mustard, MD;  Location: [facility] OR;  Service: Orthopedics;  Laterality: Left;  ? CARDIAC VALVE REPLACEMENT N/A   ? Phreesia 09/27/2020  ? IR RADIOLOGY PERIPHERAL GUIDED IV START  10/05/2019  ? IR US GUIDE VASC ACCESS RIGHT  10/05/2019  ? MULTIPLE EXTRACTIONS WITH ALVEOLOPLASTY N/A 10/26/2019  ? Procedure: EXTRACTION OF TOOTH #'S 3, 5-11,19-28,  AND 32 WITH ALVEOLOPLASTY;   Surgeon: Charlynne Pander, DDS;  Location: [facility] OR;  Service: Oral Surgery;  Laterality: N/A;  ? RIGHT HEART CATH AND CORONARY ANGIOGRAPHY N/A 09/24/2019  ? Procedure: RIGHT HEART CATH AND CORONARY ANGIOGRAPHY;  Surgeon: Tonny Bollman, MD;  Location: [facility] INVASIVE CV LAB;  Service: Cardiovascular;  Laterality: N/A;  ? STUMP REVISION Left 10/30/2021  ? Procedure: REVISION LEFT BELOW KNEE AMPUTATION;  Surgeon: Nadara Mustard, MD;  Location: [facility] OR;  Service: Orthopedics;  Laterality: Left;  ? STUMP REVISION Left 12/25/2021  ? Procedure: REVISION LEFT BELOW KNEE AMPUTATION;  Surgeon: Nadara Mustard, MD;  Location: [facility] OR;  Service: Orthopedics;  Laterality: Left;  ? TEE WITHOUT CARDIOVERSION N/A 12/05/2019  ? Procedure: TRANSESOPHAGEAL ECHOCARDIOGRAM (TEE);  Surgeon: Purcell Nails, MD;  Location: [facility] OR;  Service: Open  Heart Surgery;  Laterality: N/A;  ? TEE WITHOUT CARDIOVERSION N/A 09/01/2021  ? Procedure: TRANSESOPHAGEAL ECHOCARDIOGRAM (TEE);  Surgeon: Little IshikawaSchumann, Christopher L, MD;  Location: [facility] ENDOSCOPY;  Service: Cardiovascular;  Laterality: N/A;  ? ?Social History  ? ?Occupational History  ?  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 Smoking status: Never  ? Smokeless tobacco: Former  ?  Types: Chew  ?  Quit date: 2021  ?Vaping Use  ? Vaping Use: Never used  ?Substance and Sexual Activity  ? Alcohol use: Yes  ?  Alcohol/week: 4.0 standard drinks  ?  Types: 4 Cans of beer per week  ?  Comment: occasionally  ? Drug use: No  ? Sexual activity: Yes  ?  Birth control/protection: None  ? ? ?

## 2022-01-15 ENCOUNTER — Encounter: Payer: 59 | Admitting: Family

## 2022-01-18 ENCOUNTER — Encounter: Payer: 59 | Admitting: Nurse Practitioner

## 2022-01-18 ENCOUNTER — Other Ambulatory Visit: Payer: Self-pay

## 2022-01-18 ENCOUNTER — Telehealth: Payer: Self-pay | Admitting: Pharmacist

## 2022-01-18 ENCOUNTER — Ambulatory Visit (INDEPENDENT_AMBULATORY_CARE_PROVIDER_SITE_OTHER): Payer: 59 | Admitting: Internal Medicine

## 2022-01-18 ENCOUNTER — Encounter: Payer: Self-pay | Admitting: Internal Medicine

## 2022-01-18 VITALS — BP 127/77 | HR 89 | Resp 16 | Ht 70.0 in | Wt 345.0 lb

## 2022-01-18 DIAGNOSIS — M869 Osteomyelitis, unspecified: Secondary | ICD-10-CM

## 2022-01-18 NOTE — Telephone Encounter (Signed)
Messaged [facility] team and gave verbal order per Dr. Thedore Mins to stop patient's caspofungin today and continue ertapenem until planned end date of 02/06/22. [facility] sent back confirmation.  ? ?Miri Jose L. Elijan Googe, PharmD ?[facility] Clinical Pharmacist Practitioner ? ?

## 2022-01-18 NOTE — Progress Notes (Signed)
? ?   ? ? ? ? ?Patient Active Problem List  ? Diagnosis Date Noted  ? S/P BKA (below knee amputation), left ([place]) 10/30/2021  ? History of below knee amputation, left ([place]) 10/30/2021  ? Dehiscence of amputation stump of left lower extremity (HCC)   ? Below-knee amputation of left lower extremity ([place]) 09/02/2021  ? Severe protein-calorie malnutrition ([place])   ? MSSA bacteremia   ? Ulcer of left foot ([place])   ? Subacute osteomyelitis, left ankle and foot ([place])   ? Diabetic foot infection ([place]) 02/14/2021  ? Diabetic ulcer of left foot associated with type 1 diabetes mellitus, limited to breakdown of skin ([place])   ? Leukocytosis 02/13/2021  ? Hyponatremia 02/13/2021  ? Normocytic anemia 02/13/2021  ? Hyperglycemia due to diabetes mellitus ([place]) 02/13/2021  ? Open wound of left foot 02/12/2021  ? Left leg swelling 02/02/2021  ? History of complete ray amputation of fifth toe of left foot ([place]) 11/04/2020  ? Immunization due 11/04/2020  ? Anxiety 09/30/2020  ? Preventative health care 12/13/2019  ? S/P aortic valve replacement with mechanical valve 12/05/2019  ? S/P AVR (aortic valve replacement) 12/05/2019  ? Syncope 11/10/2019  ? Personal history of noncompliance with medical treatment, presenting hazards to health 03/16/2018  ? Uncontrolled diabetes mellitus with hyperglycemia, without long-term current use of insulin ([place]) 10/12/2015  ? Hyperlipidemia associated with type 2 diabetes mellitus ([place]) 10/12/2015  ? Morbid obesity ([place]) 10/12/2015  ? Chest pain 07/02/2013  ? HTN (hypertension) 07/02/2013  ? ? ?Patient's Medications  ?New Prescriptions  ? No medications on file  ?Previous Medications  ? AMINO ACIDS-PROTEIN HYDROLYS (FEEDING SUPPLEMENT, PRO-STAT SUGAR FREE 64,) LIQD    Take 30 mLs by mouth 3 (three) times daily with meals.  ? AMOXICILLIN (AMOXIL) 500 MG CAPSULE    Take 2 capsules (1,000 mg total) by mouth 3 (three) times daily.  ? ATORVASTATIN (LIPITOR) 80 MG TABLET    Take 1 tablet (80 mg total) by mouth  daily.  ? CASPOFUNGIN (CANCIDAS) IVPB    Inject 50 mg into the vein daily for 21 days. Indication:  L BKA Abscess ?First Dose: Yes ?Last Day of Therapy:  01/18/22 ?Labs - Once weekly:  CBC/D and BMP, ?Labs - Every other week:  ESR and CRP ?Method of administration: Elastomeric ?Method of administration may be changed at the discretion of home infusion pharmacist based upon assessment of the patient and/or caregiver's ability to self-administer the medication ordered.  ? CEPHALEXIN (KEFLEX) 500 MG CAPSULE    Take 1 capsule (500 mg total) by mouth 3 (three) times daily.  ? ERTAPENEM (INVANZ) IVPB    Inject 1 g into the vein daily. Indication:  L BKA Abscess ?First Dose: Yes ?Last Day of Therapy:  02/06/22 ?Labs - Once weekly:  CBC/D and BMP, ?Labs - Every other week:  ESR and CRP ?Method of administration: Mini-Bag Plus / Gravity ?Method of administration may be changed at the discretion of home infusion pharmacist based upon assessment of the patient and/or caregiver's ability to self-administer the medication ordered.  ? FERROUS SULFATE 325 (65 FE) MG TABLET    Take 1 tablet (325 mg total) by mouth daily.  ? GABAPENTIN (NEURONTIN) 300 MG CAPSULE    Take 2 capsules (600 mg total) by mouth 2 (two) times daily.  ? LISINOPRIL (ZESTRIL) 10 MG TABLET    Take 1 tablet by mouth once daily  ? METFORMIN (GLUCOPHAGE) 500 MG TABLET    Take 1 tablet (  500 mg total) by mouth 2 (two) times daily with a meal.  ? METOPROLOL TARTRATE (LOPRESSOR) 25 MG TABLET    Take 1 tablet by mouth twice daily  ? OXYCODONE-ACETAMINOPHEN (PERCOCET/ROXICET) 5-325 MG TABLET    Take 1 tablet by mouth every 4 (four) hours as needed.  ? PANTOPRAZOLE (PROTONIX) 40 MG TABLET    Take 1 tablet by mouth once daily  ? SERTRALINE (ZOLOFT) 50 MG TABLET    Take 1 tablet (50 mg total) by mouth daily.  ? SULFAMETHOXAZOLE-TRIMETHOPRIM (BACTRIM DS) 800-160 MG TABLET    Take 1 tablet by mouth 2 (two) times daily.  ? WARFARIN (COUMADIN) 10 MG TABLET    TAKE 2 TABLETS BY  MOUTH ONCE DAILY AT  4  PM  ?Modified Medications  ? No medications on file  ?Discontinued Medications  ? No medications on file  ? ? ?Subjective: ?45 YM with PMX as below present s or follow-up.  He was last followed by ID on 117 11/16/2021 for MSSA/GBS bacteremia in the setting of left diabetic foot ulcer status post definitive source control with BKA and 4 weeks IV cefazolin.  PICC line appears to have been removed the patient in January.  TEE was negative for vegetation in the setting of prosthetic aortic valve.   ?BKA was complicated by poor wound healing and BKA revision x2. First revision  Cx + ecoli  proteus mirabilis and enterococcus and discharged on doxyclyne. Placed on bactrim on 3/22 due to poor wound healing/necrotic tissue.  He underwent BKA revision on 3/31 with Cx+ ecoli, klebseilla MDR and costerdidium perfingens. He is followed by Dondra Prader, NP orthopedics last seen on 01/14/2022 and wounds appear to be healing well. ?Today 01/18/22: He denies fever chills, abdominal pain, N,V. Reports 100% adherence to antibiotics. He reports stools less formed which he attributes to amoxicillin.  ?Review of Systems: ?Review of Systems  ?All other systems reviewed and are negative. ? ?Past Medical History:  ?Diagnosis Date  ? Anxiety   ? Aortic stenosis   ? Cellulitis and abscess of lower extremity 06/11/2019  ? Cellulitis of fourth toe of left foot   ? Cholelithiasis   ? Coronary artery disease   ? Nonobstructive CAD (40-50% LAD) 08/2019  ? Depression   ? Diabetes mellitus without complication ([place])   ? Phreesia 09/27/2020  ? Elevated troponin level not due myocardial infarction 11/11/2019  ? Essential hypertension   ? Gangrene of toe of left foot ([place]) 07/06/2019  ? Heart murmur   ? Phreesia 09/27/2020  ? Hyperlipidemia   ? Phreesia 09/27/2020  ? Hypertension   ? Phreesia 09/27/2020  ? Mixed hyperlipidemia   ? Morbid obesity ([place])   ? S/P aortic valve replacement with mechanical valve 12/05/2019  ? 25 mm  Carbomedics top hat bileaflet mechanical valve via partial upper hemi-sternotomy  ? Severe aortic stenosis 09/24/2019  ? Type 2 diabetes mellitus ([place])   ? ? ?Social History  ? ?Tobacco Use  ? Smoking status: Never  ? Smokeless tobacco: Former  ?  Types: Chew  ?  Quit date: 2021  ?Vaping Use  ? Vaping Use: Never used  ?Substance Use Topics  ? Alcohol use: Yes  ?  Alcohol/week: 4.0 standard drinks  ?  Types: 4 Cans of beer per week  ?  Comment: occasionally  ? Drug use: No  ? ? ?Family History  ?Problem Relation Age of Onset  ? Cancer Mother   ?     Brain  ?  Heart disease Father   ? Hyperlipidemia Father   ? Hypertension Father   ? Stroke Father   ? Heart murmur Sister   ? ? ?Allergies  ?Allergen Reactions  ? Pollen Extract Other (See Comments)  ?  Runny nose, watery eyes, sneezing  ? ? ?Health Maintenance  ?Topic Date Due  ? COVID-19 Vaccine (4 - Booster for Moderna series) 11/12/2020  ? COLONOSCOPY (Pts 45-81yr Insurance coverage will need to be confirmed)  Never done  ? INFLUENZA VACCINE  04/27/2022  ? HEMOGLOBIN A1C  04/29/2022  ? FOOT EXAM  10/19/2022  ? OPHTHALMOLOGY EXAM  11/24/2022  ? TETANUS/TDAP  06/10/2029  ? Hepatitis C Screening  Completed  ? HIV Screening  Completed  ? HPV VACCINES  Aged Out  ? ? ?Objective: ? ?There were no vitals filed for this visit. ?There is no height or weight on file to calculate BMI. ? ?Physical Exam ?Constitutional:   ?   General: He is not in acute distress. ?   Appearance: He is normal weight. He is not toxic-appearing.  ?HENT:  ?   Head: Normocephalic and atraumatic.  ?   Right Ear: External ear normal.  ?   Left Ear: External ear normal.  ?   Nose: No congestion or rhinorrhea.  ?   Mouth/Throat:  ?   Mouth: Mucous membranes are moist.  ?   Pharynx: Oropharynx is clear.  ?Eyes:  ?   Extraocular Movements: Extraocular movements intact.  ?   Conjunctiva/sclera: Conjunctivae normal.  ?   Pupils: Pupils are equal, round, and reactive to light.  ?Cardiovascular:  ?   Rate and  Rhythm: Normal rate and regular rhythm.  ?   Heart sounds: No murmur heard. ?  No friction rub. No gallop.  ?Pulmonary:  ?   Effort: Pulmonary effort is normal.  ?   Breath sounds: Normal breath sounds.  ?Abdomi

## 2022-01-19 ENCOUNTER — Encounter: Payer: 59 | Admitting: Nurse Practitioner

## 2022-01-20 ENCOUNTER — Telehealth: Payer: Self-pay | Admitting: Internal Medicine

## 2022-01-20 ENCOUNTER — Telehealth: Payer: Self-pay

## 2022-01-20 ENCOUNTER — Ambulatory Visit (INDEPENDENT_AMBULATORY_CARE_PROVIDER_SITE_OTHER): Payer: 59 | Admitting: *Deleted

## 2022-01-20 DIAGNOSIS — Z5181 Encounter for therapeutic drug level monitoring: Secondary | ICD-10-CM

## 2022-01-20 DIAGNOSIS — Z952 Presence of prosthetic heart valve: Secondary | ICD-10-CM | POA: Diagnosis not present

## 2022-01-20 LAB — POCT INR: INR: 1.4 — AB (ref 2.0–3.0)

## 2022-01-20 NOTE — Telephone Encounter (Signed)
Tina with [facility] called stating that she is unable to get blood from PICC line and would like to know what else can be done.  Stated that they tried yesterday and today.  Cb# [phone].  Please advise.  Thank you. ?

## 2022-01-20 NOTE — Telephone Encounter (Signed)
PICC line is being followed by infectious disease. ?

## 2022-01-20 NOTE — Telephone Encounter (Signed)
Adam Long informed she will give them a call. ?

## 2022-01-20 NOTE — Patient Instructions (Signed)
Take warfarin 1 tablet tonight then increase dose to 1 tablet daily except 1/2 tablet on Mondays, Wednesday and Fridays ?On IV Abx and Amoxicillin.  Stopped antifungal per ID ?Recheck in 1 wk ?

## 2022-01-20 NOTE — Telephone Encounter (Signed)
Called RN Inetta Fermo back in regards to PICC line issues. No answer.  ?Recommend going to ED to get line evaluated.  ?

## 2022-01-20 NOTE — Telephone Encounter (Signed)
Received voicemail from [place], RN with [facility] stating patient is having issues with picc line. For the past 3-2 days has noticed infusion is taking longer. RN is not able to get blood return via picc line or peripheral  stick.  ?Administered cath flow today and was still not successful pulling labs from picc.  ?RN would like to know what next steps should be.  ?P: [phone] ?Juanita Laster, RMA  ?  ?

## 2022-01-21 NOTE — Telephone Encounter (Signed)
Spoke with patient and recommended he go to ED to have PICC line evaluated per Dr. Thedore Mins, patient states he should be able to go tomorrow morning (4/28).  ? ?He reports that the line flushes, but that the nurse is unable to get blood return. His antibiotics are still infusing, but are taking approximately twice as long to infuse. He denies any redness, pain, or swelling at the insertion site.  ? ?Sandie Ano, RN ? ?

## 2022-01-22 ENCOUNTER — Encounter (HOSPITAL_COMMUNITY): Payer: Self-pay | Admitting: *Deleted

## 2022-01-22 ENCOUNTER — Emergency Department (HOSPITAL_COMMUNITY)
Admission: EM | Admit: 2022-01-22 | Discharge: 2022-01-22 | Disposition: A | Discharge status: Home / Self Care | Payer: 59 | Attending: Emergency Medicine | Admitting: Emergency Medicine

## 2022-01-22 ENCOUNTER — Other Ambulatory Visit: Payer: Self-pay

## 2022-01-22 DIAGNOSIS — Y828 Other medical devices associated with adverse incidents: Secondary | ICD-10-CM | POA: Diagnosis not present

## 2022-01-22 DIAGNOSIS — Z7901 Long term (current) use of anticoagulants: Secondary | ICD-10-CM | POA: Diagnosis not present

## 2022-01-22 DIAGNOSIS — I10 Essential (primary) hypertension: Secondary | ICD-10-CM | POA: Insufficient documentation

## 2022-01-22 DIAGNOSIS — E119 Type 2 diabetes mellitus without complications: Secondary | ICD-10-CM | POA: Diagnosis not present

## 2022-01-22 DIAGNOSIS — T82868A Thrombosis of vascular prosthetic devices, implants and grafts, initial encounter: Secondary | ICD-10-CM | POA: Insufficient documentation

## 2022-01-22 DIAGNOSIS — I2510 Atherosclerotic heart disease of native coronary artery without angina pectoris: Secondary | ICD-10-CM | POA: Diagnosis not present

## 2022-01-22 DIAGNOSIS — Z7984 Long term (current) use of oral hypoglycemic drugs: Secondary | ICD-10-CM | POA: Insufficient documentation

## 2022-01-22 DIAGNOSIS — Z79899 Other long term (current) drug therapy: Secondary | ICD-10-CM | POA: Diagnosis not present

## 2022-01-22 DIAGNOSIS — Z89512 Acquired absence of left leg below knee: Secondary | ICD-10-CM | POA: Insufficient documentation

## 2022-01-22 DIAGNOSIS — T82898A Other specified complication of vascular prosthetic devices, implants and grafts, initial encounter: Secondary | ICD-10-CM

## 2022-01-22 LAB — FUNGAL ORGANISM REFLEX

## 2022-01-22 LAB — FUNGUS CULTURE RESULT

## 2022-01-22 LAB — FUNGUS CULTURE WITH STAIN

## 2022-01-22 NOTE — ED Provider Notes (Signed)
?Adam Long ?Provider Note ? ? ?CSN: [id number] ?Arrival date & time: 01/22/22  1215 ? ?  ? ?History ? ?No chief complaint on file. ? ? ?Adam Long is a 46 y.o. male. ? ?Pt is a 46 yo male with a pmhx significant for obesity, anxiety, depression, CAD, AS s/p replacement with a mechanical valve, htn, hyperlipidemia, dm2, and a hx osteomyelitis to the left leg with left bka s/p revision twice.  Pt is on ertapenem and amox until 02/06/22 for bacteremia.  Pt is followed by ID and was just seen on 4/24.  Pt said his PICC line has not been working since Tuesday (4/25).  No IV abx since then. Pt is otherwise doing well. ? ? ?  ? ? ? ?  ? ?Home Medications ?Prior to Admission medications   ?Medication Sig Start Date End Date Taking? Authorizing Provider  ?Amino Acids-Protein Hydrolys (FEEDING SUPPLEMENT, PRO-STAT SUGAR FREE 64,) LIQD Take 30 mLs by mouth 3 (three) times daily with meals. ?Patient not taking: Reported on 12/22/2021 09/14/21   Bary Leriche, PA-C  ?amoxicillin (AMOXIL) 500 MG capsule Take 2 capsules (1,000 mg total) by mouth 3 (three) times daily. 12/28/21 02/06/22  Newt Minion, MD  ?atorvastatin (LIPITOR) 80 MG tablet Take 1 tablet (80 mg total) by mouth daily. ?Patient taking differently: Take 80 mg by mouth every evening. 11/04/20   Noreene Larsson, NP  ?cephALEXin (KEFLEX) 500 MG capsule Take 1 capsule (500 mg total) by mouth 3 (three) times daily. 12/16/21   Suzan Slick, NP  ?ertapenem [facility]) IVPB Inject 1 g into the vein daily. Indication:  L BKA Abscess ?First Dose: Yes ?Last Day of Therapy:  02/06/22 ?Labs - Once weekly:  CBC/D and BMP, ?Labs - Every other week:  ESR and CRP ?Method of administration: Mini-Bag Plus / Gravity ?Method of administration may be changed at the discretion of home infusion pharmacist based upon assessment of the patient and/or caregiver's ability to self-administer the medication ordered. 12/29/21 02/07/22  Newt Minion, MD  ?ferrous sulfate 325 (65  FE) MG tablet Take 1 tablet (325 mg total) by mouth daily. 01/06/22   Marcello Fennel, PA-C  ?gabapentin (NEURONTIN) 300 MG capsule Take 2 capsules (600 mg total) by mouth 2 (two) times daily. 12/07/21   Lovorn, Jinny Blossom, MD  ?lisinopril (ZESTRIL) 10 MG tablet Take 1 tablet by mouth once daily ?Patient taking differently: Take 20 mg by mouth daily. 10/22/21   Lovorn, Jinny Blossom, MD  ?metFORMIN (GLUCOPHAGE) 500 MG tablet Take 1 tablet (500 mg total) by mouth 2 (two) times daily with a meal. 10/19/21   Paseda, Dewaine Conger, FNP  ?metoprolol tartrate (LOPRESSOR) 25 MG tablet Take 1 tablet by mouth twice daily 12/15/21   Vena Rua R, FNP  ?oxyCODONE-acetaminophen (PERCOCET/ROXICET) 5-325 MG tablet Take 1 tablet by mouth every 4 (four) hours as needed. 12/29/21   Newt Minion, MD  ?pantoprazole (PROTONIX) 40 MG tablet Take 1 tablet by mouth once daily 12/02/21   Lovorn, Jinny Blossom, MD  ?sertraline (ZOLOFT) 50 MG tablet Take 1 tablet (50 mg total) by mouth daily. 12/07/21   Lovorn, Jinny Blossom, MD  ?sulfamethoxazole-trimethoprim (BACTRIM DS) 800-160 MG tablet Take 1 tablet by mouth 2 (two) times daily. ?Patient not taking: Reported on 01/06/2022 12/16/21   Suzan Slick, NP  ?warfarin (COUMADIN) 10 MG tablet TAKE 2 TABLETS BY MOUTH ONCE DAILY AT  4  PM 11/18/21   Courtney Heys, MD  ?   ? ?  Allergies    ?Pollen extract   ? ?Review of Systems   ?Review of Systems  ?All other systems reviewed and are negative. ? ?Physical Exam ?Updated Vital Signs ?BP (!) 108/40   Pulse 79   Temp 98.5 ?F (36.9 ?C) (Oral)   Resp 15   Ht 5' 10" (1.778 m)   Wt (!) 156.5 kg   SpO2 96%   BMI 49.50 kg/m?  ?Physical Exam ?Vitals and nursing note reviewed.  ?Constitutional:   ?   Appearance: Normal appearance.  ?HENT:  ?   Head: Normocephalic and atraumatic.  ?   Right Ear: External ear normal.  ?   Left Ear: External ear normal.  ?   Nose: Nose normal.  ?   Mouth/Throat:  ?   Mouth: Mucous membranes are moist.  ?   Pharynx: Oropharynx is clear.  ?Eyes:  ?    Extraocular Movements: Extraocular movements intact.  ?   Conjunctiva/sclera: Conjunctivae normal.  ?   Pupils: Pupils are equal, round, and reactive to light.  ?Cardiovascular:  ?   Rate and Rhythm: Normal rate and regular rhythm.  ?   Pulses: Normal pulses.  ?   Heart sounds: Normal heart sounds.  ?Pulmonary:  ?   Effort: Pulmonary effort is normal.  ?   Breath sounds: Normal breath sounds.  ?Abdominal:  ?   General: Abdomen is flat. Bowel sounds are normal.  ?   Palpations: Abdomen is soft.  ?Musculoskeletal:  ?   Cervical back: Normal range of motion and neck supple.  ?   Comments: S/p L BKA  ?Skin: ?   General: Skin is warm.  ?   Capillary Refill: Capillary refill takes less than 2 seconds.  ?Neurological:  ?   General: No focal deficit present.  ?   Mental Status: He is alert and oriented to person, place, and time.  ?Psychiatric:     ?   Mood and Affect: Mood normal.     ?   Behavior: Behavior normal.  ? ? ?ED Results / Procedures / Treatments   ?Labs ?(all labs ordered are listed, but only abnormal results are displayed) ?Labs Reviewed - No data to display ? ?EKG ?None ? ?Radiology ?No results found. ? ?Procedures ?Procedures  ? ? ?Medications Ordered in ED ?Medications - No data to display ? ?ED Course/ Medical Decision Making/ A&P ?  ?                        ?Medical Decision Making ? ?This patient presents to the ED for concern of picc line malfunction, this involves an extensive number of treatment options, and is a complaint that carries with it a high risk of complications and morbidity.  The differential diagnosis includes picc line displacement, thrombosis ? ? ?Co morbidities that complicate the patient evaluation ? ?obesity, anxiety, depression, CAD, AS s/p replacement with a mechanical valve, htn, hyperlipidemia, dm2, and a hx osteomyelitis to the left leg with left bka s/p revision twice ? ? ?Additional history obtained: ? ?Additional history obtained from epic chart review ? ?Critical  Interventions: ? ?IV team consulted ? ? ? ?Problem List / ED Course: ? ?PICC line malfunction ? ?Social Determinants of Health: ? ?Lives at home ? ? ?Dispostion: ? ?After consideration of the diagnostic results and the patients response to treatment, I feel that the patent would benefit from discharge after PICC line un-clogged..   ? ? ? ? ? ? ? ?  Final Clinical Impression(s) / ED Diagnoses ?Final diagnoses:  ?Occlusion of peripherally inserted central catheter (PICC) line, initial encounter (HCC)  ? ? ?Rx / DC Orders ?ED Discharge Orders   ? ? None  ? ?  ? ? ?  ?Haviland, Julie, MD ?01/22/22 1532 ? ?

## 2022-01-22 NOTE — Discharge Instructions (Signed)
The picc line works again ?See your doctor as needed ?ER for worsening symptoms. ?

## 2022-01-22 NOTE — ED Triage Notes (Signed)
Pt taking IV antibiotics at home through a PICC line to right arm.  Pt states PICC line will flush but will not pull back any blood or antibiotics will not run through the PICC. ?

## 2022-01-22 NOTE — ED Provider Notes (Signed)
The PICC line is working now ?Stable for d/c ?  ?Eber Hong, MD ?01/22/22 1652 ? ?

## 2022-01-25 ENCOUNTER — Other Ambulatory Visit: Payer: Self-pay

## 2022-01-25 ENCOUNTER — Emergency Department (HOSPITAL_COMMUNITY)
Admission: EM | Admit: 2022-01-25 | Discharge: 2022-01-25 | Disposition: A | Discharge status: Home / Self Care | Payer: 59 | Attending: Emergency Medicine | Admitting: Emergency Medicine

## 2022-01-25 ENCOUNTER — Other Ambulatory Visit: Payer: Self-pay | Admitting: Physical Medicine and Rehabilitation

## 2022-01-25 ENCOUNTER — Encounter (HOSPITAL_COMMUNITY): Payer: Self-pay

## 2022-01-25 DIAGNOSIS — Y712 Prosthetic and other implants, materials and accessory cardiovascular devices associated with adverse incidents: Secondary | ICD-10-CM | POA: Diagnosis not present

## 2022-01-25 DIAGNOSIS — Z7902 Long term (current) use of antithrombotics/antiplatelets: Secondary | ICD-10-CM | POA: Insufficient documentation

## 2022-01-25 DIAGNOSIS — Z79899 Other long term (current) drug therapy: Secondary | ICD-10-CM | POA: Insufficient documentation

## 2022-01-25 DIAGNOSIS — Z452 Encounter for adjustment and management of vascular access device: Secondary | ICD-10-CM

## 2022-01-25 DIAGNOSIS — T82598A Other mechanical complication of other cardiac and vascular devices and implants, initial encounter: Secondary | ICD-10-CM | POA: Insufficient documentation

## 2022-01-25 NOTE — Discharge Instructions (Signed)
PICC line operable at this time. Follow up with use and care team as needed. ?

## 2022-01-25 NOTE — ED Provider Triage Note (Signed)
Emergency Medicine Provider Triage Evaluation Note ? ?Adam Long , a 46 y.o. male  was evaluated in triage.  Pt complains of right arm PICC line unable to infuse abx at home. Assessed in [place] Friday and able to pull blood that day, unable to infuse his abx.  ? ?Review of Systems  ?Positive: none ?Negative: Arm pain, fever, swelling ? ?Physical Exam  ?There were no vitals taken for this visit. ?Gen:   Awake, no distress   ?Resp:  Normal effort  ?MSK:   Moves extremities without difficulty  ?Other:   ? ?Medical Decision Making  ?Medically screening exam initiated at 11:39 AM.  Appropriate orders placed.  Adam Long was informed that the remainder of the evaluation will be completed by another provider, this initial triage assessment does not replace that evaluation, and the importance of remaining in the ED until their evaluation is complete. ? ? ?  ?Jeannie Fend, PA-C ?01/25/22 1140 ? ?

## 2022-01-25 NOTE — ED Triage Notes (Signed)
Pt reports difficulty with his PICC line, states he cannot get it to flush ?

## 2022-01-25 NOTE — ED Provider Notes (Signed)
?[place] ?Provider Note ? ? ?CSN: [id number] ?Arrival date & time: 01/25/22  1059 ? ?  ? ?History ? ?Chief Complaint  ?Patient presents with  ? Vascular Access Problem  ? ? ?Adam Long is a 46 y.o. male. ? ?Pt complains of right arm PICC line unable to infuse abx at home. Assessed in [place] Friday and able to pull blood that day, unable to infuse his abx.  ? ? ?  ? ?Home Medications ?Prior to Admission medications   ?Medication Sig Start Date End Date Taking? Authorizing Provider  ?Amino Acids-Protein Hydrolys (FEEDING SUPPLEMENT, PRO-STAT SUGAR FREE 64,) LIQD Take 30 mLs by mouth 3 (three) times daily with meals. ?Patient not taking: Reported on 12/22/2021 09/14/21   Bary Leriche, PA-C  ?amoxicillin (AMOXIL) 500 MG capsule Take 2 capsules (1,000 mg total) by mouth 3 (three) times daily. 12/28/21 02/06/22  Newt Minion, MD  ?atorvastatin (LIPITOR) 80 MG tablet Take 1 tablet (80 mg total) by mouth daily. ?Patient taking differently: Take 80 mg by mouth every evening. 11/04/20   Noreene Larsson, NP  ?cephALEXin (KEFLEX) 500 MG capsule Take 1 capsule (500 mg total) by mouth 3 (three) times daily. 12/16/21   Suzan Slick, NP  ?ertapenem [facility]) IVPB Inject 1 g into the vein daily. Indication:  L BKA Abscess ?First Dose: Yes ?Last Day of Therapy:  02/06/22 ?Labs - Once weekly:  CBC/D and BMP, ?Labs - Every other week:  ESR and CRP ?Method of administration: Mini-Bag Plus / Gravity ?Method of administration may be changed at the discretion of home infusion pharmacist based upon assessment of the patient and/or caregiver's ability to self-administer the medication ordered. 12/29/21 02/07/22  Newt Minion, MD  ?ferrous sulfate 325 (65 FE) MG tablet Take 1 tablet (325 mg total) by mouth daily. 01/06/22   Marcello Fennel, PA-C  ?gabapentin (NEURONTIN) 300 MG capsule Take 2 capsules (600 mg total) by mouth 2 (two) times daily. 12/07/21   Lovorn, Jinny Blossom, MD  ?lisinopril (ZESTRIL)  10 MG tablet Take 1 tablet by mouth once daily ?Patient taking differently: Take 20 mg by mouth daily. 10/22/21   Lovorn, Jinny Blossom, MD  ?metFORMIN (GLUCOPHAGE) 500 MG tablet Take 1 tablet (500 mg total) by mouth 2 (two) times daily with a meal. 10/19/21   Paseda, Dewaine Conger, FNP  ?metoprolol tartrate (LOPRESSOR) 25 MG tablet Take 1 tablet by mouth twice daily 12/15/21   Vena Rua R, FNP  ?oxyCODONE-acetaminophen (PERCOCET/ROXICET) 5-325 MG tablet Take 1 tablet by mouth every 4 (four) hours as needed. 12/29/21   Newt Minion, MD  ?pantoprazole (PROTONIX) 40 MG tablet Take 1 tablet by mouth once daily 12/02/21   Lovorn, Jinny Blossom, MD  ?sertraline (ZOLOFT) 50 MG tablet Take 1 tablet (50 mg total) by mouth daily. 12/07/21   Lovorn, Jinny Blossom, MD  ?sulfamethoxazole-trimethoprim (BACTRIM DS) 800-160 MG tablet Take 1 tablet by mouth 2 (two) times daily. ?Patient not taking: Reported on 01/06/2022 12/16/21   Suzan Slick, NP  ?warfarin (COUMADIN) 10 MG tablet TAKE 2 TABLETS BY MOUTH ONCE DAILY AT  4  PM 11/18/21   Courtney Heys, MD  ?   ? ?Allergies    ?Pollen extract   ? ?Review of Systems   ?Review of Systems ?Negative except as per HPI ?Physical Exam ?Updated Vital Signs ?BP 118/79 (BP Location: Left Arm)   Pulse 91   Temp 98 ?F (36.7 ?C) (Oral)   Resp 17   Ht  '5\' 10"'  (1.778 m)   Wt (!) 156.5 kg   SpO2 99%   BMI 49.50 kg/m?  ?Physical Exam ?Vitals and nursing note reviewed.  ?Constitutional:   ?   General: He is not in acute distress. ?   Appearance: He is well-developed. He is not diaphoretic.  ?HENT:  ?   Head: Normocephalic and atraumatic.  ?Cardiovascular:  ?   Pulses: Normal pulses.  ?Pulmonary:  ?   Effort: Pulmonary effort is normal.  ?Skin: ?   General: Skin is warm and dry.  ?   Findings: No erythema or rash.  ?Neurological:  ?   Mental Status: He is alert and oriented to person, place, and time.  ?Psychiatric:     ?   Behavior: Behavior normal.  ? ? ?ED Results / Procedures / Treatments   ?Labs ?(all labs ordered  are listed, but only abnormal results are displayed) ?Labs Reviewed - No data to display ? ?EKG ?None ? ?Radiology ?No results found. ? ?Procedures ?Procedures  ? ? ?Medications Ordered in ED ?Medications - No data to display ? ?ED Course/ Medical Decision Making/ A&P ?  ?                        ?Medical Decision Making ? ?46 year old male with PICC line in right arm concerned he is unable to flush to administer his IV antibiotics.  Patient was assessed by the PICC line team who states line is flushing and functioning properly.  He is discharged to continue management at home. ? ? ? ? ? ? ? ?Final Clinical Impression(s) / ED Diagnoses ?Final diagnoses:  ?PICC (peripherally inserted central catheter) flush  ? ? ?Rx / DC Orders ?ED Discharge Orders   ? ? None  ? ?  ? ? ?  ?Tacy Learn, PA-C ?01/25/22 1637 ? ?  ?Sherwood Gambler, MD ?01/27/22 1524 ? ?

## 2022-01-25 NOTE — Progress Notes (Signed)
Consult placed for PICC assessment. Patient had abx extension tubing on line. RN removed extension tubing and changed cap on PICC line. Was able to draw back blood and flush without difficulty. Patient is due for home health tomorrow. Instructed patient to ensure that home health changes the cap of PICC line weekly during dressing change.  ? ?Serafino Burciaga Loyola Mast, RN ? ?

## 2022-01-26 ENCOUNTER — Ambulatory Visit (INDEPENDENT_AMBULATORY_CARE_PROVIDER_SITE_OTHER): Payer: 59 | Admitting: Family

## 2022-01-26 ENCOUNTER — Ambulatory Visit (INDEPENDENT_AMBULATORY_CARE_PROVIDER_SITE_OTHER): Payer: 59 | Admitting: *Deleted

## 2022-01-26 ENCOUNTER — Encounter: Payer: Self-pay | Admitting: Family

## 2022-01-26 DIAGNOSIS — Z5181 Encounter for therapeutic drug level monitoring: Secondary | ICD-10-CM | POA: Diagnosis not present

## 2022-01-26 DIAGNOSIS — T8781 Dehiscence of amputation stump: Secondary | ICD-10-CM

## 2022-01-26 DIAGNOSIS — S88112A Complete traumatic amputation at level between knee and ankle, left lower leg, initial encounter: Secondary | ICD-10-CM

## 2022-01-26 DIAGNOSIS — Z952 Presence of prosthetic heart valve: Secondary | ICD-10-CM | POA: Diagnosis not present

## 2022-01-26 DIAGNOSIS — Z89512 Acquired absence of left leg below knee: Secondary | ICD-10-CM

## 2022-01-26 LAB — POCT INR: INR: 2.6 (ref 2.0–3.0)

## 2022-01-26 NOTE — Progress Notes (Signed)
? ?Post-Op Visit Note ?  ?Patient: Adam Long           ?Date of Birth: 1976-02-16           ?MRN: [id number] ?Visit Date: 01/26/2022 ?PCP: Donell Beers, FNP ? ?Chief Complaint:  ?Chief Complaint  ?Patient presents with  ? Left Leg - Routine Post Op  ?  12/25/21 revision left BKA   ? ? ?HPI:  ?HPI ?The patient is a 46 year old gentleman who presents in follow-up he is status post left below-knee amputation revision March 31 he has done quite well this time.  He has been cleansing daily doing dry dressing changes wearing a shrinker over dry dressing ?Ortho Exam ?On examination of his left residual limb overall this is well-healed there are 2 open areas they appear to be healing but continue to weep serosanguineous drainage which is significant.  There is no erythema or induration no warmth ? ?Visit Diagnoses: No diagnosis found. ? ?Plan: Sutures harvested today.  Did leave 3 sutures to harvest at next visit.  Provided some silver cell which she may use over the 2 areas of weeping encouraged him to continue with compression and dry dressings ? ?Follow-Up Instructions: Return in about 10 days (around 02/05/2022).  ? ?Imaging: ?No results found. ? ?Orders:  ?No orders of the defined types were placed in this encounter. ? ?No orders of the defined types were placed in this encounter. ? ? ? ?PMFS History: ?Patient Active Problem List  ? Diagnosis Date Noted  ? S/P BKA (below knee amputation), left (HCC) 10/30/2021  ? History of below knee amputation, left (HCC) 10/30/2021  ? Dehiscence of amputation stump of left lower extremity (HCC)   ? Below-knee amputation of left lower extremity (HCC) 09/02/2021  ? Severe protein-calorie malnutrition (HCC)   ? MSSA bacteremia   ? Ulcer of left foot (HCC)   ? Subacute osteomyelitis, left ankle and foot (HCC)   ? Diabetic foot infection (HCC) 02/14/2021  ? Diabetic ulcer of left foot associated with type 1 diabetes mellitus, limited to breakdown of skin (HCC)   ? Leukocytosis  02/13/2021  ? Hyponatremia 02/13/2021  ? Normocytic anemia 02/13/2021  ? Hyperglycemia due to diabetes mellitus (HCC) 02/13/2021  ? Open wound of left foot 02/12/2021  ? Left leg swelling 02/02/2021  ? History of complete ray amputation of fifth toe of left foot (HCC) 11/04/2020  ? Immunization due 11/04/2020  ? Anxiety 09/30/2020  ? Preventative health care 12/13/2019  ? S/P aortic valve replacement with mechanical valve 12/05/2019  ? S/P AVR (aortic valve replacement) 12/05/2019  ? Syncope 11/10/2019  ? Personal history of noncompliance with medical treatment, presenting hazards to health 03/16/2018  ? Uncontrolled diabetes mellitus with hyperglycemia, without long-term current use of insulin (HCC) 10/12/2015  ? Hyperlipidemia associated with type 2 diabetes mellitus (HCC) 10/12/2015  ? Morbid obesity (HCC) 10/12/2015  ? Chest pain 07/02/2013  ? HTN (hypertension) 07/02/2013  ? ?Past Medical History:  ?Diagnosis Date  ? Anxiety   ? Aortic stenosis   ? Cellulitis and abscess of lower extremity 06/11/2019  ? Cellulitis of fourth toe of left foot   ? Cholelithiasis   ? Coronary artery disease   ? Nonobstructive CAD (40-50% LAD) 08/2019  ? Depression   ? Diabetes mellitus without complication (HCC)   ? Phreesia 09/27/2020  ? Elevated troponin level not due myocardial infarction 11/11/2019  ? Essential hypertension   ? Gangrene of toe of left foot (HCC) 07/06/2019  ?  Heart murmur   ? Phreesia 09/27/2020  ? Hyperlipidemia   ? Phreesia 09/27/2020  ? Hypertension   ? Phreesia 09/27/2020  ? Mixed hyperlipidemia   ? Morbid obesity (HCC)   ? S/P aortic valve replacement with mechanical valve 12/05/2019  ? 25 mm Carbomedics top hat bileaflet mechanical valve via partial upper hemi-sternotomy  ? Severe aortic stenosis 09/24/2019  ? Type 2 diabetes mellitus (HCC)   ?  ?Family History  ?Problem Relation Age of Onset  ? Cancer Mother   ?     Brain  ? Heart disease Father   ? Hyperlipidemia Father   ? Hypertension Father   ?  Stroke Father   ? Heart murmur Sister   ?  ?Past Surgical History:  ?Procedure Laterality Date  ? ABDOMINAL AORTOGRAM W/LOWER EXTREMITY N/A 07/06/2019  ? Procedure: ABDOMINAL AORTOGRAM W/LOWER EXTREMITY;  Surgeon: Sherren KernsFields, Charles E, MD;  [facility];  Service: Cardiovascular;  Laterality: N/A;  Bilateral  ? AMPUTATION Left 07/09/2019  ? Procedure: LEFT FOURTH and Fifth TOE AMPUTATION.;  Surgeon: Larina EarthlyEarly, Todd F, MD;  [facility];  Service: Vascular;  Laterality: Left;  ? AMPUTATION Left 03/11/2021  ? Procedure: LEFT FOOT 5TH  AND 4TH RAY AMPUTATION;  Surgeon: Nadara Mustarduda, Marcus V, MD;  [facility];  Service: Orthopedics;  Laterality: Left;  ? AMPUTATION Left 08/28/2021  ? Procedure: AMPUTATION BELOW KNEE;  Surgeon: Nadara Mustarduda, Marcus V, MD;  [facility];  Service: Orthopedics;  Laterality: Left;  ? AORTIC VALVE REPLACEMENT N/A 12/05/2019  ? Procedure: PARTIAL STERNOTOMY FOR AORTIC VALVE REPLACEMENT (AVR), USING CARBOMEDICS SUPRA-ANNULAR TOP HAT 25MM;  Surgeon: Purcell Nailswen, Clarence H, MD;  [facility];  Service: Open Heart Surgery;  Laterality: N/A;  No neck lines on left  ? APPLICATION OF WOUND VAC Left 10/30/2021  ? Procedure: APPLICATION OF WOUND VAC;  Surgeon: Nadara Mustarduda, Marcus V, MD;  [facility];  Service: Orthopedics;  Laterality: Left;  ? APPLICATION OF WOUND VAC Left 12/25/2021  ? Procedure: APPLICATION OF WOUND VAC;  Surgeon: Nadara Mustarduda, Marcus V, MD;  [facility];  Service: Orthopedics;  Laterality: Left;  ? CARDIAC VALVE REPLACEMENT N/A   ? Phreesia 09/27/2020  ? IR RADIOLOGY PERIPHERAL GUIDED IV START  10/05/2019  ? IR US GUIDE VASC ACCESS RIGHT  10/05/2019  ? MULTIPLE EXTRACTIONS WITH ALVEOLOPLASTY N/A 10/26/2019  ? Procedure: EXTRACTION OF TOOTH #'S 3, 5-11,19-28,  AND 32 WITH ALVEOLOPLASTY;  Surgeon: Charlynne PanderKulinski, Ronald F, DDS;  Location: [facility] OR;  Service: Oral Surgery;  Laterality: N/A;  ? RIGHT HEART CATH AND CORONARY ANGIOGRAPHY N/A 09/24/2019  ? Procedure: RIGHT HEART CATH AND CORONARY ANGIOGRAPHY;   Surgeon: Tonny Bollmanooper, Michael, MD;  [facility];  Service: Cardiovascular;  Laterality: N/A;  ? STUMP REVISION Left 10/30/2021  ? Procedure: REVISION LEFT BELOW KNEE AMPUTATION;  Surgeon: Nadara Mustarduda, Marcus V, MD;  [facility];  Service: Orthopedics;  Laterality: Left;  ? STUMP REVISION Left 12/25/2021  ? Procedure: REVISION LEFT BELOW KNEE AMPUTATION;  Surgeon: Nadara Mustarduda, Marcus V, MD;  [facility];  Service: Orthopedics;  Laterality: Left;  ? TEE WITHOUT CARDIOVERSION N/A 12/05/2019  ? Procedure: TRANSESOPHAGEAL ECHOCARDIOGRAM (TEE);  Surgeon: Purcell Nailswen, Clarence H, MD;  [facility];  Service: Open Heart Surgery;  Laterality: N/A;  ? TEE WITHOUT CARDIOVERSION N/A 09/01/2021  ? Procedure: TRANSESOPHAGEAL ECHOCARDIOGRAM (TEE);  Surgeon: Little IshikawaSchumann, Christopher L, MD;  [facility];  Service: Cardiovascular;  Laterality: N/A;  ? ?Social History  ? ?Occupational  History  ?  Comment: Glass blower/designer- self-employed  ?Tobacco Use  ? Smoking status: Never  ? Smokeless tobacco: Former  ?  Types: Chew  ?  Quit date: 2021  ?Vaping Use  ? Vaping Use: Never used  ?Substance and Sexual Activity  ? Alcohol use: Yes  ?  Alcohol/week: 4.0 standard drinks  ?  Types: 4 Cans of beer per week  ?  Comment: occasionally  ? Drug use: No  ? Sexual activity: Yes  ?  Birth control/protection: None  ? ? ?

## 2022-01-26 NOTE — Patient Instructions (Signed)
Continue warfarin 1 tablet daily except 1/2 tablet on Mondays, Wednesday and Fridays ?On IV Abx only now. ?Recheck in 1 wk ?

## 2022-01-27 ENCOUNTER — Ambulatory Visit (INDEPENDENT_AMBULATORY_CARE_PROVIDER_SITE_OTHER): Payer: Self-pay | Admitting: Podiatry

## 2022-01-27 DIAGNOSIS — Z91199 Patient's noncompliance with other medical treatment and regimen due to unspecified reason: Secondary | ICD-10-CM

## 2022-01-27 NOTE — Progress Notes (Signed)
No show

## 2022-01-28 ENCOUNTER — Other Ambulatory Visit: Payer: Self-pay | Admitting: Physical Medicine and Rehabilitation

## 2022-02-02 ENCOUNTER — Other Ambulatory Visit: Payer: Self-pay

## 2022-02-02 ENCOUNTER — Telehealth: Payer: Self-pay

## 2022-02-02 ENCOUNTER — Encounter: Payer: Self-pay | Admitting: Internal Medicine

## 2022-02-02 ENCOUNTER — Ambulatory Visit (INDEPENDENT_AMBULATORY_CARE_PROVIDER_SITE_OTHER): Payer: 59 | Admitting: Internal Medicine

## 2022-02-02 VITALS — BP 162/93 | HR 92 | Temp 97.5°F

## 2022-02-02 DIAGNOSIS — Z89512 Acquired absence of left leg below knee: Secondary | ICD-10-CM | POA: Diagnosis not present

## 2022-02-02 MED ORDER — PANTOPRAZOLE SODIUM 40 MG PO TBEC: 40.0000 mg | DELAYED_RELEASE_TABLET | Freq: Every day | ORAL | 0 refills | Status: DC

## 2022-02-02 NOTE — Progress Notes (Signed)
? ?   ? ? ? ? ?Patient Active Problem List  ? Diagnosis Date Noted  ? S/P BKA (below knee amputation), left ([place]) 10/30/2021  ? History of below knee amputation, left ([place]) 10/30/2021  ? Dehiscence of amputation stump of left lower extremity (HCC)   ? Below-knee amputation of left lower extremity ([place]) 09/02/2021  ? Severe protein-calorie malnutrition ([place])   ? MSSA bacteremia   ? Ulcer of left foot ([place])   ? Subacute osteomyelitis, left ankle and foot ([place])   ? Diabetic foot infection ([place]) 02/14/2021  ? Diabetic ulcer of left foot associated with type 1 diabetes mellitus, limited to breakdown of skin ([place])   ? Leukocytosis 02/13/2021  ? Hyponatremia 02/13/2021  ? Normocytic anemia 02/13/2021  ? Hyperglycemia due to diabetes mellitus ([place]) 02/13/2021  ? Open wound of left foot 02/12/2021  ? Left leg swelling 02/02/2021  ? History of complete ray amputation of fifth toe of left foot ([place]) 11/04/2020  ? Immunization due 11/04/2020  ? Anxiety 09/30/2020  ? Preventative health care 12/13/2019  ? S/P aortic valve replacement with mechanical valve 12/05/2019  ? S/P AVR (aortic valve replacement) 12/05/2019  ? Syncope 11/10/2019  ? Personal history of noncompliance with medical treatment, presenting hazards to health 03/16/2018  ? Uncontrolled diabetes mellitus with hyperglycemia, without long-term current use of insulin ([place]) 10/12/2015  ? Hyperlipidemia associated with type 2 diabetes mellitus ([place]) 10/12/2015  ? Morbid obesity ([place]) 10/12/2015  ? Chest pain 07/02/2013  ? HTN (hypertension) 07/02/2013  ? ? ?Patient's Medications  ?New Prescriptions  ? No medications on file  ?Previous Medications  ? AMINO ACIDS-PROTEIN HYDROLYS (FEEDING SUPPLEMENT, PRO-STAT SUGAR FREE 64,) LIQD    Take 30 mLs by mouth 3 (three) times daily with meals.  ? AMOXICILLIN (AMOXIL) 500 MG CAPSULE    Take 2 capsules (1,000 mg total) by mouth 3 (three) times daily.  ? ATORVASTATIN (LIPITOR) 80 MG TABLET    Take 1 tablet (80 mg total) by mouth  daily.  ? CEPHALEXIN (KEFLEX) 500 MG CAPSULE    Take 1 capsule (500 mg total) by mouth 3 (three) times daily.  ? ERTAPENEM (INVANZ) IVPB    Inject 1 g into the vein daily. Indication:  L BKA Abscess ?First Dose: Yes ?Last Day of Therapy:  02/06/22 ?Labs - Once weekly:  CBC/D and BMP, ?Labs - Every other week:  ESR and CRP ?Method of administration: Mini-Bag Plus / Gravity ?Method of administration may be changed at the discretion of home infusion pharmacist based upon assessment of the patient and/or caregiver's ability to self-administer the medication ordered.  ? FERROUS SULFATE 325 (65 FE) MG TABLET    Take 1 tablet (325 mg total) by mouth daily.  ? GABAPENTIN (NEURONTIN) 300 MG CAPSULE    Take 2 capsules (600 mg total) by mouth 2 (two) times daily.  ? LISINOPRIL (ZESTRIL) 10 MG TABLET    Take 1 tablet by mouth once daily  ? METFORMIN (GLUCOPHAGE) 500 MG TABLET    Take 1 tablet (500 mg total) by mouth 2 (two) times daily with a meal.  ? METOPROLOL TARTRATE (LOPRESSOR) 25 MG TABLET    Take 1 tablet by mouth twice daily  ? OXYCODONE-ACETAMINOPHEN (PERCOCET/ROXICET) 5-325 MG TABLET    Take 1 tablet by mouth every 4 (four) hours as needed.  ? PANTOPRAZOLE (PROTONIX) 40 MG TABLET    Take 1 tablet by mouth once daily  ? SERTRALINE (ZOLOFT) 50 MG TABLET    Take 1  tablet (50 mg total) by mouth daily.  ? SULFAMETHOXAZOLE-TRIMETHOPRIM (BACTRIM DS) 800-160 MG TABLET    Take 1 tablet by mouth 2 (two) times daily.  ? WARFARIN (COUMADIN) 10 MG TABLET    TAKE 2 TABLETS BY MOUTH ONCE DAILY AT  4  PM  ?Modified Medications  ? No medications on file  ?Discontinued Medications  ? No medications on file  ? ? ?Subjective: ?45 YM with PMX as below present s or follow-up.  He was last followed by ID on 117 11/16/2021 for MSSA/GBS bacteremia in the setting of left diabetic foot ulcer status post definitive source control with BKA and 4 weeks IV cefazolin.  PICC line appears to have been removed the patient in January.  TEE was negative  for vegetation in the setting of prosthetic aortic valve.   ?BKA was complicated by poor wound healing and BKA revision x2. First revision  Cx + ecoli  proteus mirabilis and enterococcus and discharged on doxyclyne. Placed on bactrim on 3/22 due to poor wound healing/necrotic tissue.  He underwent BKA revision on 3/31 with Cx+ ecoli, klebseilla MDR and costerdidium perfingens. He is followed by Erin Zamora, NP orthopedics last seen on 01/14/2022 and wounds appear to be healing well. ? 01/18/22: He denies fever chills, abdominal pain, N,V. Reports 100% adherence to antibiotics. He reports stools less formed which he attributes to amoxicillin.   ?Today 02/02/22: Went to ED in the interim du to PICC line concerns. No issues noted, able to be flushed.Pt had missed about 4 days of IV antibiotics. Light bloody drainage noted from let stump, no purulent drainage. Dnies fever, chills.   ?Review of Systems: ?Review of Systems  ?All other systems reviewed and are negative. ? ?Past Medical History:  ?Diagnosis Date  ? Anxiety   ? Aortic stenosis   ? Cellulitis and abscess of lower extremity 06/11/2019  ? Cellulitis of fourth toe of left foot   ? Cholelithiasis   ? Coronary artery disease   ? Nonobstructive CAD (40-50% LAD) 08/2019  ? Depression   ? Diabetes mellitus without complication (HCC)   ? Phreesia 09/27/2020  ? Elevated troponin level not due myocardial infarction 11/11/2019  ? Essential hypertension   ? Gangrene of toe of left foot (HCC) 07/06/2019  ? Heart murmur   ? Phreesia 09/27/2020  ? Hyperlipidemia   ? Phreesia 09/27/2020  ? Hypertension   ? Phreesia 09/27/2020  ? Mixed hyperlipidemia   ? Morbid obesity (HCC)   ? S/P aortic valve replacement with mechanical valve 12/05/2019  ? 25 mm Carbomedics top hat bileaflet mechanical valve via partial upper hemi-sternotomy  ? Severe aortic stenosis 09/24/2019  ? Type 2 diabetes mellitus (HCC)   ? ? ?Social History  ? ?Tobacco Use  ? Smoking status: Never  ? Smokeless tobacco:  Former  ?  Types: Chew  ?  Quit date: 2021  ?Vaping Use  ? Vaping Use: Never used  ?Substance Use Topics  ? Alcohol use: Yes  ?  Alcohol/week: 4.0 standard drinks  ?  Types: 4 Cans of beer per week  ?  Comment: occasionally  ? Drug use: No  ? ? ?Family History  ?Problem Relation Age of Onset  ? Cancer Mother   ?     Brain  ? Heart disease Father   ? Hyperlipidemia Father   ? Hypertension Father   ? Stroke Father   ? Heart murmur Sister   ? ? ?Allergies  ?Allergen Reactions  ? Pollen Extract   Other (See Comments)  ?  Runny nose, watery eyes, sneezing  ? ? ?Health Maintenance  ?Topic Date Due  ? COVID-19 Vaccine (4 - Booster for Moderna series) 11/12/2020  ? COLONOSCOPY (Pts 45-49yrs Insurance coverage will need to be confirmed)  Never done  ? INFLUENZA VACCINE  04/27/2022  ? HEMOGLOBIN A1C  04/29/2022  ? FOOT EXAM  10/19/2022  ? OPHTHALMOLOGY EXAM  11/24/2022  ? TETANUS/TDAP  06/10/2029  ? Hepatitis C Screening  Completed  ? HIV Screening  Completed  ? HPV VACCINES  Aged Out  ? ? ?Objective: ? ?Vitals:  ? 02/02/22 1030  ?BP: (!) 162/93  ?Pulse: 92  ?Temp: (!) 97.5 ?F (36.4 ?C)  ?TempSrc: Oral  ? ?There is no height or weight on file to calculate BMI. ? ?Physical Exam ?Constitutional:   ?   General: He is not in acute distress. ?   Appearance: He is normal weight. He is not toxic-appearing.  ?HENT:  ?   Head: Normocephalic and atraumatic.  ?   Right Ear: External ear normal.  ?   Left Ear: External ear normal.  ?   Nose: No congestion or rhinorrhea.  ?   Mouth/Throat:  ?   Mouth: Mucous membranes are moist.  ?   Pharynx: Oropharynx is clear.  ?Eyes:  ?   Extraocular Movements: Extraocular movements intact.  ?   Conjunctiva/sclera: Conjunctivae normal.  ?   Pupils: Pupils are equal, round, and reactive to light.  ?Cardiovascular:  ?   Rate and Rhythm: Normal rate and regular rhythm.  ?   Heart sounds: No murmur heard. ?  No friction rub. No gallop.  ?Pulmonary:  ?   Effort: Pulmonary effort is normal.  ?   Breath  sounds: Normal breath sounds.  ?Abdominal:  ?   General: Abdomen is flat. Bowel sounds are normal.  ?   Palpations: Abdomen is soft.  ?Musculoskeletal:     ?   General: No swelling.  ?   Cervical back: Normal

## 2022-02-02 NOTE — Telephone Encounter (Signed)
Called [facility] [phone]) to relay orders of IV antibiotics extending to 02/10/22 and pull picc after last dose. Spoke to case manger Laney Potash who verbalized her understanding and I also faxed over MD note with OPAT orders. ?

## 2022-02-03 ENCOUNTER — Ambulatory Visit (INDEPENDENT_AMBULATORY_CARE_PROVIDER_SITE_OTHER): Payer: 59 | Admitting: *Deleted

## 2022-02-03 DIAGNOSIS — Z5181 Encounter for therapeutic drug level monitoring: Secondary | ICD-10-CM | POA: Diagnosis not present

## 2022-02-03 DIAGNOSIS — Z952 Presence of prosthetic heart valve: Secondary | ICD-10-CM

## 2022-02-03 LAB — POCT INR: INR: 2.9 (ref 2.0–3.0)

## 2022-02-03 NOTE — Patient Instructions (Signed)
Continue warfarin 1 tablet daily except 1/2 tablet on Mondays, Wednesday and Fridays ?On IV Abx only now.  Will finish 5/16 ?Recheck in 2 wk ?

## 2022-02-05 ENCOUNTER — Ambulatory Visit (INDEPENDENT_AMBULATORY_CARE_PROVIDER_SITE_OTHER): Payer: 59 | Admitting: Family

## 2022-02-05 ENCOUNTER — Encounter: Payer: Self-pay | Admitting: Family

## 2022-02-05 DIAGNOSIS — S88112A Complete traumatic amputation at level between knee and ankle, left lower leg, initial encounter: Secondary | ICD-10-CM

## 2022-02-05 DIAGNOSIS — Z89512 Acquired absence of left leg below knee: Secondary | ICD-10-CM

## 2022-02-05 NOTE — Progress Notes (Signed)
? ?Post-Op Visit Note ?  ?Patient: Adam Long           ?Date of Birth: 04/17/1976           ?MRN: [id number] ?Visit Date: 02/05/2022 ?PCP: Renee Rival, FNP ? ?Chief Complaint:  ?Chief Complaint  ?Patient presents with  ? Left Leg - Routine Post Op  ?  12/25/21 revision left BKA  ? ? ?HPI:  ?HPI ?The patient is a 46 year old gentleman seen status post revision left below-knee amputation wearing a shrinker with direct skin contact ?Ortho Exam ?On examination of his incision this continues to improve he does have a few remaining sutures these were harvested today there is no erythema no warmth ? ?Visit Diagnoses: No diagnosis found. ? ?Plan: Pleased with continued improvement he will continue with daily Dial soap cleansing and dressings follow-up in 2 weeks  ? ?Follow-Up Instructions: No follow-ups on file.  ? ?Imaging: ?No results found. ? ?Orders:  ?No orders of the defined types were placed in this encounter. ? ?No orders of the defined types were placed in this encounter. ? ? ? ?PMFS History: ?Patient Active Problem List  ? Diagnosis Date Noted  ? S/P BKA (below knee amputation), left ([place]) 10/30/2021  ? History of below knee amputation, left ([place]) 10/30/2021  ? Dehiscence of amputation stump of left lower extremity (HCC)   ? Below-knee amputation of left lower extremity ([place]) 09/02/2021  ? Severe protein-calorie malnutrition ([place])   ? MSSA bacteremia   ? Ulcer of left foot ([place])   ? Subacute osteomyelitis, left ankle and foot ([place])   ? Diabetic foot infection ([place]) 02/14/2021  ? Diabetic ulcer of left foot associated with type 1 diabetes mellitus, limited to breakdown of skin ([place])   ? Leukocytosis 02/13/2021  ? Hyponatremia 02/13/2021  ? Normocytic anemia 02/13/2021  ? Hyperglycemia due to diabetes mellitus ([place]) 02/13/2021  ? Open wound of left foot 02/12/2021  ? Left leg swelling 02/02/2021  ? History of complete ray amputation of fifth toe of left foot ([place]) 11/04/2020  ? Immunization due  11/04/2020  ? Anxiety 09/30/2020  ? Preventative health care 12/13/2019  ? S/P aortic valve replacement with mechanical valve 12/05/2019  ? S/P AVR (aortic valve replacement) 12/05/2019  ? Syncope 11/10/2019  ? Personal history of noncompliance with medical treatment, presenting hazards to health 03/16/2018  ? Uncontrolled diabetes mellitus with hyperglycemia, without long-term current use of insulin ([place]) 10/12/2015  ? Hyperlipidemia associated with type 2 diabetes mellitus ([place]) 10/12/2015  ? Morbid obesity ([place]) 10/12/2015  ? Chest pain 07/02/2013  ? HTN (hypertension) 07/02/2013  ? ?Past Medical History:  ?Diagnosis Date  ? Anxiety   ? Aortic stenosis   ? Cellulitis and abscess of lower extremity 06/11/2019  ? Cellulitis of fourth toe of left foot   ? Cholelithiasis   ? Coronary artery disease   ? Nonobstructive CAD (40-50% LAD) 08/2019  ? Depression   ? Diabetes mellitus without complication ([place])   ? Phreesia 09/27/2020  ? Elevated troponin level not due myocardial infarction 11/11/2019  ? Essential hypertension   ? Gangrene of toe of left foot ([place]) 07/06/2019  ? Heart murmur   ? Phreesia 09/27/2020  ? Hyperlipidemia   ? Phreesia 09/27/2020  ? Hypertension   ? Phreesia 09/27/2020  ? Mixed hyperlipidemia   ? Morbid obesity ([place])   ? S/P aortic valve replacement with mechanical valve 12/05/2019  ? 25 mm Carbomedics top hat bileaflet mechanical valve via partial upper  hemi-sternotomy  ? Severe aortic stenosis 09/24/2019  ? Type 2 diabetes mellitus ([place])   ?  ?Family History  ?Problem Relation Age of Onset  ? Cancer Mother   ?     Brain  ? Heart disease Father   ? Hyperlipidemia Father   ? Hypertension Father   ? Stroke Father   ? Heart murmur Sister   ?  ?Past Surgical History:  ?Procedure Laterality Date  ? ABDOMINAL AORTOGRAM W/LOWER EXTREMITY N/A 07/06/2019  ? Procedure: ABDOMINAL AORTOGRAM W/LOWER EXTREMITY;  Surgeon: Elam Dutch, MD;  Location: [facility];  Service: Cardiovascular;  Laterality:  N/A;  Bilateral  ? AMPUTATION Left 07/09/2019  ? Procedure: LEFT FOURTH and Fifth TOE AMPUTATION.;  Surgeon: Rosetta Posner, MD;  Location: [place];  Service: Vascular;  Laterality: Left;  ? AMPUTATION Left 03/11/2021  ? Procedure: LEFT FOOT 5TH  AND 4TH RAY AMPUTATION;  Surgeon: Newt Minion, MD;  Location: [place];  Service: Orthopedics;  Laterality: Left;  ? AMPUTATION Left 08/28/2021  ? Procedure: AMPUTATION BELOW KNEE;  Surgeon: Newt Minion, MD;  Location: [place];  Service: Orthopedics;  Laterality: Left;  ? AORTIC VALVE REPLACEMENT N/A 12/05/2019  ? Procedure: PARTIAL STERNOTOMY FOR AORTIC VALVE REPLACEMENT (AVR), USING CARBOMEDICS SUPRA-ANNULAR TOP HAT 25MM;  Surgeon: Rexene Alberts, MD;  Location: [place];  Service: Open Heart Surgery;  Laterality: N/A;  No neck lines on left  ? APPLICATION OF WOUND VAC Left 10/30/2021  ? Procedure: APPLICATION OF WOUND VAC;  Surgeon: Newt Minion, MD;  Location: [place];  Service: Orthopedics;  Laterality: Left;  ? APPLICATION OF WOUND VAC Left 12/25/2021  ? Procedure: APPLICATION OF WOUND VAC;  Surgeon: Newt Minion, MD;  Location: [place];  Service: Orthopedics;  Laterality: Left;  ? CARDIAC VALVE REPLACEMENT N/A   ? Phreesia 09/27/2020  ? IR RADIOLOGY PERIPHERAL GUIDED IV START  10/05/2019  ? IR US GUIDE VASC ACCESS RIGHT  10/05/2019  ? MULTIPLE EXTRACTIONS WITH ALVEOLOPLASTY N/A 10/26/2019  ? Procedure: EXTRACTION OF TOOTH #'S 3, 5-11,19-28,  AND 32 WITH ALVEOLOPLASTY;  Surgeon: Lenn Cal, DDS;  Location: [place];  Service: Oral Surgery;  Laterality: N/A;  ? RIGHT HEART CATH AND CORONARY ANGIOGRAPHY N/A 09/24/2019  ? Procedure: RIGHT HEART CATH AND CORONARY ANGIOGRAPHY;  Surgeon: Sherren Mocha, MD;  Location: [facility] [place] [facility];  Service: Cardiovascular;  Laterality: N/A;  ? STUMP REVISION Left 10/30/2021  ? Procedure: REVISION LEFT BELOW KNEE AMPUTATION;  Surgeon: Newt Minion, MD;  Location: [place];  Service: Orthopedics;  Laterality: Left;  ? STUMP REVISION Left  12/25/2021  ? Procedure: REVISION LEFT BELOW KNEE AMPUTATION;  Surgeon: Newt Minion, MD;  Location: [place];  Service: Orthopedics;  Laterality: Left;  ? TEE WITHOUT CARDIOVERSION N/A 12/05/2019  ? Procedure: TRANSESOPHAGEAL ECHOCARDIOGRAM (TEE);  Surgeon: Rexene Alberts, MD;  Location: [place];  Service: Open Heart Surgery;  Laterality: N/A;  ? TEE WITHOUT CARDIOVERSION N/A 09/01/2021  ? Procedure: TRANSESOPHAGEAL ECHOCARDIOGRAM (TEE);  Surgeon: Donato Heinz, MD;  Location: [place];  Service: Cardiovascular;  Laterality: N/A;  ? ?Social History  ? ?Occupational History  ?  Comment: Neurosurgeon- self-employed  ?Tobacco Use  ? Smoking status: Never  ? Smokeless tobacco: Former  ?  Types: Chew  ?  Quit date: 2021  ?Vaping Use  ? Vaping Use: Never used  ?Substance and Sexual Activity  ? Alcohol use: Yes  ?  Alcohol/week: 4.0  standard drinks  ?  Types: 4 Cans of beer per week  ?  Comment: occasionally  ? Drug use: No  ? Sexual activity: Yes  ?  Birth control/protection: None  ? ? ?

## 2022-02-10 ENCOUNTER — Other Ambulatory Visit: Payer: Self-pay | Admitting: Physical Medicine and Rehabilitation

## 2022-02-18 ENCOUNTER — Ambulatory Visit (INDEPENDENT_AMBULATORY_CARE_PROVIDER_SITE_OTHER): Payer: 59 | Admitting: *Deleted

## 2022-02-18 DIAGNOSIS — Z5181 Encounter for therapeutic drug level monitoring: Secondary | ICD-10-CM

## 2022-02-18 DIAGNOSIS — Z952 Presence of prosthetic heart valve: Secondary | ICD-10-CM

## 2022-02-18 LAB — POCT INR: INR: 3.8 — AB (ref 2.0–3.0)

## 2022-02-18 NOTE — Patient Instructions (Signed)
Description   Hold warfarin today.  Then continue warfarin 1 tablet daily except 1/2 tablet on Mondays, Wednesday and Fridays Recheck in 2 wk

## 2022-02-19 ENCOUNTER — Ambulatory Visit (INDEPENDENT_AMBULATORY_CARE_PROVIDER_SITE_OTHER): Payer: 59 | Admitting: Family

## 2022-02-19 DIAGNOSIS — Z89512 Acquired absence of left leg below knee: Secondary | ICD-10-CM

## 2022-02-24 ENCOUNTER — Encounter: Payer: Self-pay | Admitting: Family

## 2022-02-24 NOTE — Progress Notes (Addendum)
Post-Op Visit Note   Patient: Adam Long           Date of Birth: 1976-06-13           MRN: [id number] Visit Date: 02/19/2022 PCP: Donell Beers, FNP  Chief Complaint:  Chief Complaint  Patient presents with   Left Leg - Routine Post Op    12/25/2021 left BKA revision     HPI:  HPI The patient is a 46 year old gentleman seen status post revision left below-knee amputation wearing a shrinker with direct skin contact.  Continues with 2 open areas that are much improved.  Patient is a new left transtibial  amputee.  Patient's current comorbidities are not expected to impact the ability to function with the prescribed prosthesis. Patient verbally communicates a strong desire to use a prosthesis. Patient currently requires mobility aids to ambulate without a prosthesis.  Expects not to use mobility aids with a new prosthesis.  Patient is a K3 level ambulator that spends a lot of time walking around on uneven terrain over obstacles, up and down stairs, and ambulates with a variable cadence.       Ortho Exam On examination of his incision this continues to improve. he does have 2 remaining open ares which are 1 cm in diameter with scant serousanguinous drainage. no erythema no warmth. No sign of infection.   Visit Diagnoses:  1. Below-knee amputation of left lower extremity (HCC)     Plan: will apply silver cell to ulcers. Continue shrinker. May follow with [facility] for prosthesis set up. Will follow up in office in 3 weeks.   Follow-Up Instructions: No follow-ups on file.   Imaging: No results found.  Orders:  No orders of the defined types were placed in this encounter.  No orders of the defined types were placed in this encounter.    PMFS History: Patient Active Problem List   Diagnosis Date Noted   S/P BKA (below knee amputation), left (HCC) 10/30/2021   History of below knee amputation, left (HCC) 10/30/2021   Dehiscence of amputation stump of left  lower extremity (HCC)    Below-knee amputation of left lower extremity (HCC) 09/02/2021   Severe protein-calorie malnutrition (HCC)    MSSA bacteremia    Ulcer of left foot (HCC)    Subacute osteomyelitis, left ankle and foot (HCC)    Diabetic foot infection (HCC) 02/14/2021   Diabetic ulcer of left foot associated with type 1 diabetes mellitus, limited to breakdown of skin (HCC)    Leukocytosis 02/13/2021   Hyponatremia 02/13/2021   Normocytic anemia 02/13/2021   Hyperglycemia due to diabetes mellitus (HCC) 02/13/2021   Open wound of left foot 02/12/2021   Left leg swelling 02/02/2021   History of complete ray amputation of fifth toe of left foot (HCC) 11/04/2020   Immunization due 11/04/2020   Anxiety 09/30/2020   Preventative health care 12/13/2019   S/P aortic valve replacement with mechanical valve 12/05/2019   S/P AVR (aortic valve replacement) 12/05/2019   Syncope 11/10/2019   Personal history of noncompliance with medical treatment, presenting hazards to health 03/16/2018   Uncontrolled diabetes mellitus with hyperglycemia, without long-term current use of insulin (HCC) 10/12/2015   Hyperlipidemia associated with type 2 diabetes mellitus (HCC) 10/12/2015   Morbid obesity (HCC) 10/12/2015   Chest pain 07/02/2013   HTN (hypertension) 07/02/2013   Past Medical History:  Diagnosis Date   Anxiety    Aortic stenosis    Cellulitis and abscess of lower extremity  06/11/2019   Cellulitis of fourth toe of left foot    Cholelithiasis    Coronary artery disease    Nonobstructive CAD (40-50% LAD) 08/2019   Depression    Diabetes mellitus without complication (HCC)    Phreesia 09/27/2020   Elevated troponin level not due myocardial infarction 11/11/2019   Essential hypertension    Gangrene of toe of left foot (HCC) 07/06/2019   Heart murmur    Phreesia 09/27/2020   Hyperlipidemia    Phreesia 09/27/2020   Hypertension    Phreesia 09/27/2020   Mixed hyperlipidemia    Morbid  obesity (HCC)    S/P aortic valve replacement with mechanical valve 12/05/2019   25 mm Carbomedics top hat bileaflet mechanical valve via partial upper hemi-sternotomy   Severe aortic stenosis 09/24/2019   Type 2 diabetes mellitus (HCC)     Family History  Problem Relation Age of Onset   Cancer Mother        Brain   Heart disease Father    Hyperlipidemia Father    Hypertension Father    Stroke Father    Heart murmur Sister     Past Surgical History:  Procedure Laterality Date   ABDOMINAL AORTOGRAM W/LOWER EXTREMITY N/A 07/06/2019   Procedure: ABDOMINAL AORTOGRAM W/LOWER EXTREMITY;  Surgeon: Sherren Kerns, MD;  [facility];  Service: Cardiovascular;  Laterality: N/A;  Bilateral   AMPUTATION Left 07/09/2019   Procedure: LEFT FOURTH and Fifth TOE AMPUTATION.;  Surgeon: Larina Earthly, MD;  Location: [facility];  Service: Vascular;  Laterality: Left;   AMPUTATION Left 03/11/2021   Procedure: LEFT FOOT 5TH  AND 4TH RAY AMPUTATION;  Surgeon: Nadara Mustard, MD;  Location: [facility];  Service: Orthopedics;  Laterality: Left;   AMPUTATION Left 08/28/2021   Procedure: AMPUTATION BELOW KNEE;  Surgeon: Nadara Mustard, MD;  Location: [facility];  Service: Orthopedics;  Laterality: Left;   AORTIC VALVE REPLACEMENT N/A 12/05/2019   Procedure: PARTIAL STERNOTOMY FOR AORTIC VALVE REPLACEMENT (AVR), USING CARBOMEDICS SUPRA-ANNULAR TOP HAT ;  Surgeon: Purcell Nails, MD;  Location: [facility];  Service: Open Heart Surgery;  Laterality: N/A;  No neck lines on left   APPLICATION OF WOUND VAC Left 10/30/2021   Procedure: APPLICATION OF WOUND VAC;  Surgeon: Nadara Mustard, MD;  [facility];  Service: Orthopedics;  Laterality: Left;   APPLICATION OF WOUND VAC Left 12/25/2021   Procedure: APPLICATION OF WOUND VAC;  Surgeon: Nadara Mustard, MD;  [facility];  Service: Orthopedics;  Laterality: Left;   CARDIAC VALVE REPLACEMENT N/A    Phreesia 09/27/2020   IR RADIOLOGY PERIPHERAL GUIDED IV START   10/05/2019   IR US GUIDE VASC ACCESS RIGHT  10/05/2019   MULTIPLE EXTRACTIONS WITH ALVEOLOPLASTY N/A 10/26/2019   Procedure: EXTRACTION OF TOOTH #'S 3, 5-11,19-28,  AND 32 WITH ALVEOLOPLASTY;  Surgeon: Charlynne Pander, DDS;  Location: [facility];  Service: Oral Surgery;  Laterality: N/A;   RIGHT HEART CATH AND CORONARY ANGIOGRAPHY N/A 09/24/2019   Procedure: RIGHT HEART CATH AND CORONARY ANGIOGRAPHY;  Surgeon: Tonny Bollman, MD;  [facility];  Service: Cardiovascular;  Laterality: N/A;   STUMP REVISION Left 10/30/2021   Procedure: REVISION LEFT BELOW KNEE AMPUTATION;  Surgeon: Nadara Mustard, MD;  Location: [place] [facility];  Service: Orthopedics;  Laterality: Left;   STUMP REVISION Left 12/25/2021   Procedure: REVISION LEFT BELOW KNEE AMPUTATION;  Surgeon: Nadara Mustard, MD;  Location: [facility] OR;  Service:  Orthopedics;  Laterality: Left;   TEE WITHOUT CARDIOVERSION N/A 12/05/2019   Procedure: TRANSESOPHAGEAL ECHOCARDIOGRAM (TEE);  Surgeon: Purcell Nailswen, Clarence H, MD;  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

## 2022-02-26 ENCOUNTER — Encounter: Payer: 59 | Admitting: Family

## 2022-02-28 ENCOUNTER — Other Ambulatory Visit: Payer: Self-pay | Admitting: Nurse Practitioner

## 2022-02-28 DIAGNOSIS — E1165 Type 2 diabetes mellitus with hyperglycemia: Secondary | ICD-10-CM

## 2022-03-04 ENCOUNTER — Ambulatory Visit (INDEPENDENT_AMBULATORY_CARE_PROVIDER_SITE_OTHER): Payer: 59 | Admitting: *Deleted

## 2022-03-04 DIAGNOSIS — Z5181 Encounter for therapeutic drug level monitoring: Secondary | ICD-10-CM | POA: Diagnosis not present

## 2022-03-04 DIAGNOSIS — Z952 Presence of prosthetic heart valve: Secondary | ICD-10-CM | POA: Diagnosis not present

## 2022-03-04 LAB — POCT INR: INR: 1.8 — AB (ref 2.0–3.0)

## 2022-03-04 NOTE — Patient Instructions (Signed)
Take warfarin 1 1/2 tablets tonight then resume 1 tablet daily except 1/2 tablet on Mondays, Wednesday and Fridays Recheck in 3 wk

## 2022-03-05 ENCOUNTER — Ambulatory Visit: Payer: 59 | Admitting: Internal Medicine

## 2022-03-07 ENCOUNTER — Other Ambulatory Visit: Payer: Self-pay | Admitting: Nurse Practitioner

## 2022-03-07 DIAGNOSIS — I10 Essential (primary) hypertension: Secondary | ICD-10-CM

## 2022-03-12 ENCOUNTER — Telehealth: Payer: Self-pay

## 2022-03-12 ENCOUNTER — Other Ambulatory Visit: Payer: Self-pay | Admitting: Nurse Practitioner

## 2022-03-12 NOTE — Telephone Encounter (Signed)
Adam Long with [employer] called and asked if you could please make an amendment to the last office dictation for this pt. He needs the 5 tenants for his prosthetic and they would like to make him a K3 ambulator as he is very active at work.

## 2022-03-16 ENCOUNTER — Ambulatory Visit (INDEPENDENT_AMBULATORY_CARE_PROVIDER_SITE_OTHER): Payer: 59 | Admitting: Family

## 2022-03-16 ENCOUNTER — Encounter: Payer: Self-pay | Admitting: Family

## 2022-03-16 DIAGNOSIS — S88112A Complete traumatic amputation at level between knee and ankle, left lower leg, initial encounter: Secondary | ICD-10-CM

## 2022-03-16 DIAGNOSIS — Z89512 Acquired absence of left leg below knee: Secondary | ICD-10-CM

## 2022-03-16 NOTE — Progress Notes (Addendum)
Post-Op Visit Note   Patient: Adam Long           Date of Birth: 1976-07-23           MRN: [id number] Visit Date: 03/16/2022 PCP: Adam Beers, FNP  Chief Complaint: No chief complaint on file.   HPI:  HPI The patient is a 46 year old gentleman seen status post revision left below-knee amputation he is currently wearing a shrinker  Has 1 remaining open area he has been packing open with silver cell.  He states that he has an appointment later this week for socket fitting.   Ortho Exam On examination of the left residual limb he has 1 remaining open area along his medial incision this is 8 mm in diameter there is no depth this is filled in with granulation there is no active drainage no surrounding erythema  Visit Diagnoses: No diagnosis found.  Plan: The left residual limb is sufficiently healed.  Patient would be safe to proceed with prosthesis set up and use.  Proceed with prosthesis set up and gait training we will plan to see him back in the office in 3 months.  He will continue packing this area open with gauze until healed  Follow-Up Instructions: Return in about 3 months (around 06/16/2022).   Imaging: No results found.  Orders:  No orders of the defined types were placed in this encounter.  No orders of the defined types were placed in this encounter.    PMFS History: Patient Active Problem List   Diagnosis Date Noted   S/P BKA (below knee amputation), left (HCC) 10/30/2021   History of below knee amputation, left (HCC) 10/30/2021   Dehiscence of amputation stump of left lower extremity (HCC)    Below-knee amputation of left lower extremity (HCC) 09/02/2021   Severe protein-calorie malnutrition (HCC)    MSSA bacteremia    Ulcer of left foot (HCC)    Subacute osteomyelitis, left ankle and foot (HCC)    Diabetic foot infection (HCC) 02/14/2021   Diabetic ulcer of left foot associated with type 1 diabetes mellitus, limited to breakdown of skin  (HCC)    Leukocytosis 02/13/2021   Hyponatremia 02/13/2021   Normocytic anemia 02/13/2021   Hyperglycemia due to diabetes mellitus (HCC) 02/13/2021   Open wound of left foot 02/12/2021   Left leg swelling 02/02/2021   History of complete ray amputation of fifth toe of left foot (HCC) 11/04/2020   Immunization due 11/04/2020   Anxiety 09/30/2020   Preventative health care 12/13/2019   S/P aortic valve replacement with mechanical valve 12/05/2019   S/P AVR (aortic valve replacement) 12/05/2019   Syncope 11/10/2019   Personal history of noncompliance with medical treatment, presenting hazards to health 03/16/2018   Uncontrolled diabetes mellitus with hyperglycemia, without long-term current use of insulin (HCC) 10/12/2015   Hyperlipidemia associated with type 2 diabetes mellitus (HCC) 10/12/2015   Morbid obesity (HCC) 10/12/2015   Chest pain 07/02/2013   HTN (hypertension) 07/02/2013   Past Medical History:  Diagnosis Date   Anxiety    Aortic stenosis    Cellulitis and abscess of lower extremity 06/11/2019   Cellulitis of fourth toe of left foot    Cholelithiasis    Coronary artery disease    Nonobstructive CAD (40-50% LAD) 08/2019   Depression    Diabetes mellitus without complication (HCC)    Phreesia 09/27/2020   Elevated troponin level not due myocardial infarction 11/11/2019   Essential hypertension    Gangrene of toe  of left foot (HCC) 07/06/2019   Heart murmur    Phreesia 09/27/2020   Hyperlipidemia    Phreesia 09/27/2020   Hypertension    Phreesia 09/27/2020   Mixed hyperlipidemia    Morbid obesity (HCC)    S/P aortic valve replacement with mechanical valve 12/05/2019   25 mm Carbomedics top hat bileaflet mechanical valve via partial upper hemi-sternotomy   Severe aortic stenosis 09/24/2019   Type 2 diabetes mellitus (HCC)     Family History  Problem Relation Age of Onset   Cancer Mother        Brain   Heart disease Father    Hyperlipidemia Father     Hypertension Father    Stroke Father    Heart murmur Sister     Past Surgical History:  Procedure Laterality Date   ABDOMINAL AORTOGRAM W/LOWER EXTREMITY N/A 07/06/2019   Procedure: ABDOMINAL AORTOGRAM W/LOWER EXTREMITY;  Surgeon: Sherren Kerns, MD;  Location: [facility] INVASIVE CV LAB;  Service: Cardiovascular;  Laterality: N/A;  Bilateral   AMPUTATION Left 07/09/2019   Procedure: LEFT FOURTH and Fifth TOE AMPUTATION.;  Surgeon: Larina Earthly, MD;  Location: [facility] OR;  Service: Vascular;  Laterality: Left;   AMPUTATION Left 03/11/2021   Procedure: LEFT FOOT 5TH  AND 4TH RAY AMPUTATION;  Surgeon: Nadara Mustard, MD;  Location: [facility] OR;  Service: Orthopedics;  Laterality: Left;   AMPUTATION Left 08/28/2021   Procedure: AMPUTATION BELOW KNEE;  Surgeon: Nadara Mustard, MD;  Location: [facility] OR;  Service: Orthopedics;  Laterality: Left;   AORTIC VALVE REPLACEMENT N/A 12/05/2019   Procedure: PARTIAL STERNOTOMY FOR AORTIC VALVE REPLACEMENT (AVR), USING CARBOMEDICS SUPRA-ANNULAR TOP HAT ;  Surgeon: Purcell Nails, MD;  Location: [facility] OR;  Service: Open Heart Surgery;  Laterality: N/A;  No neck lines on left   APPLICATION OF WOUND VAC Left 10/30/2021   Procedure: APPLICATION OF WOUND VAC;  Surgeon: Nadara Mustard, MD;  Location: [facility] OR;  Service: Orthopedics;  Laterality: Left;   APPLICATION OF WOUND VAC Left 12/25/2021   Procedure: APPLICATION OF WOUND VAC;  Surgeon: Nadara Mustard, MD;  Location: [facility] OR;  Service: Orthopedics;  Laterality: Left;   CARDIAC VALVE REPLACEMENT N/A    Phreesia 09/27/2020   IR RADIOLOGY PERIPHERAL GUIDED IV START  10/05/2019   IR US GUIDE VASC ACCESS RIGHT  10/05/2019   MULTIPLE EXTRACTIONS WITH ALVEOLOPLASTY N/A 10/26/2019   Procedure: EXTRACTION OF TOOTH #'S 3, 5-11,19-28,  AND 32 WITH ALVEOLOPLASTY;  Surgeon: Charlynne Pander, DDS;  Location: [facility] OR;  Service: Oral Surgery;  Laterality: N/A;   RIGHT HEART CATH AND CORONARY ANGIOGRAPHY N/A 09/24/2019   Procedure: RIGHT HEART CATH AND  CORONARY ANGIOGRAPHY;  Surgeon: Tonny Bollman, MD;  Location: [facility] INVASIVE CV LAB;  Service: Cardiovascular;  Laterality: N/A;   STUMP REVISION Left 10/30/2021   Procedure: REVISION LEFT BELOW KNEE AMPUTATION;  Surgeon: Nadara Mustard, MD;  Location: [facility] OR;  Service: Orthopedics;  Laterality: Left;   STUMP REVISION Left 12/25/2021   Procedure: REVISION LEFT BELOW KNEE AMPUTATION;  Surgeon: Nadara Mustard, MD;  Location: [facility] OR;  Service: Orthopedics;  Laterality: Left;   TEE WITHOUT CARDIOVERSION N/A 12/05/2019   Procedure: TRANSESOPHAGEAL ECHOCARDIOGRAM (TEE);  Surgeon: Purcell Nails, MD;  Location: [facility] OR;  Service: Open Heart Surgery;  Laterality: N/A;   TEE WITHOUT CARDIOVERSION N/A 09/01/2021   Procedure: TRANSESOPHAGEAL ECHOCARDIOGRAM (TEE);  Surgeon: Little Ishikawa, MD;  Location: [facility] ENDOSCOPY;  Service: Cardiovascular;  Laterality: N/A;  Social History   Occupational History    Comment: Glass blower/designer- self-employed  Tobacco Use   Smoking status: Never   Smokeless tobacco: Former    Types: Chew    Quit date: 2021  Vaping Use   Vaping Use: Never used  Substance and Sexual Activity   Alcohol use: Yes    Alcohol/week: 4.0 standard drinks of alcohol    Types: 4 Cans of beer per week    Comment: occasionally   Drug use: No   Sexual activity: Yes    Birth control/protection: None

## 2022-03-22 ENCOUNTER — Ambulatory Visit (INDEPENDENT_AMBULATORY_CARE_PROVIDER_SITE_OTHER): Payer: 59 | Admitting: *Deleted

## 2022-03-22 DIAGNOSIS — Z952 Presence of prosthetic heart valve: Secondary | ICD-10-CM | POA: Diagnosis not present

## 2022-03-22 DIAGNOSIS — Z5181 Encounter for therapeutic drug level monitoring: Secondary | ICD-10-CM

## 2022-03-22 LAB — POCT INR: INR: 2.2 (ref 2.0–3.0)

## 2022-03-25 ENCOUNTER — Other Ambulatory Visit: Payer: Self-pay | Admitting: Nurse Practitioner

## 2022-03-25 DIAGNOSIS — E1165 Type 2 diabetes mellitus with hyperglycemia: Secondary | ICD-10-CM

## 2022-04-01 ENCOUNTER — Encounter: Payer: Self-pay | Admitting: Nurse Practitioner

## 2022-04-01 ENCOUNTER — Ambulatory Visit (INDEPENDENT_AMBULATORY_CARE_PROVIDER_SITE_OTHER): Payer: 59 | Admitting: Nurse Practitioner

## 2022-04-01 VITALS — BP 135/84 | HR 88 | Ht 70.0 in | Wt 350.0 lb

## 2022-04-01 DIAGNOSIS — D509 Iron deficiency anemia, unspecified: Secondary | ICD-10-CM | POA: Insufficient documentation

## 2022-04-01 DIAGNOSIS — Z1211 Encounter for screening for malignant neoplasm of colon: Secondary | ICD-10-CM | POA: Diagnosis not present

## 2022-04-01 DIAGNOSIS — E1169 Type 2 diabetes mellitus with other specified complication: Secondary | ICD-10-CM

## 2022-04-01 DIAGNOSIS — D508 Other iron deficiency anemias: Secondary | ICD-10-CM

## 2022-04-01 DIAGNOSIS — Z0000 Encounter for general adult medical examination without abnormal findings: Secondary | ICD-10-CM | POA: Insufficient documentation

## 2022-04-01 DIAGNOSIS — Z0001 Encounter for general adult medical examination with abnormal findings: Secondary | ICD-10-CM | POA: Diagnosis not present

## 2022-04-01 DIAGNOSIS — E785 Hyperlipidemia, unspecified: Secondary | ICD-10-CM

## 2022-04-01 DIAGNOSIS — E118 Type 2 diabetes mellitus with unspecified complications: Secondary | ICD-10-CM

## 2022-04-01 DIAGNOSIS — I10 Essential (primary) hypertension: Secondary | ICD-10-CM

## 2022-04-01 MED ORDER — FERROUS SULFATE 325 (65 FE) MG PO TABS: 325.0000 mg | ORAL_TABLET | Freq: Every day | ORAL | 0 refills | Status: DC

## 2022-04-01 MED ORDER — LISINOPRIL 20 MG PO TABS: 20.0000 mg | ORAL_TABLET | Freq: Every day | ORAL | 3 refills | Status: DC

## 2022-04-01 NOTE — Progress Notes (Addendum)
Complete physical exam  Patient: Adam Long   DOB: [birth year]   45 y.o. Male  MRN: [id number]  Subjective:    Chief Complaint  Patient presents with   Annual Exam    cpe    [name] with past medical history of hypertension, type 2 diabetes, morbid obesity, AVR, is a 46 y.o. male who presents today for a complete physical exam. He reports consuming a low fat and low sodium diet. Currently not doing exercise due to his LBKA. He generally feels well. He reports sleeping well.  has upcoming appointment to get a prosthetic leg patient is excited and looking forward to this.    Due for colon cancer screening Cologuard ordered today.   Most recent fall risk assessment:    04/01/2022    3:19 PM  Fall Risk   Falls in the past year? 0  Number falls in past yr: 0  Injury with Fall? 0  Risk for fall due to : No Fall Risks  Follow up Falls evaluation completed     Most recent depression screenings:    04/01/2022    3:19 PM 01/18/2022    9:02 AM  PHQ 2/9 Scores  PHQ - 2 Score 2 1  PHQ- 9 Score 4         Patient Care Team: Donell Beers, FNP as PCP - General (Nurse Practitioner) Jonelle Sidle, MD as PCP - Cardiology (Cardiology)   Outpatient Medications Prior to Visit  Medication Sig Note   atorvastatin (LIPITOR) 80 MG tablet Take 1 tablet (80 mg total) by mouth daily. (Patient taking differently: Take 80 mg by mouth every evening.)    gabapentin (NEURONTIN) 300 MG capsule Take 2 capsules (600 mg total) by mouth 2 (two) times daily.    metFORMIN (GLUCOPHAGE) 500 MG tablet TAKE 1 TABLET BY MOUTH TWICE DAILY WITH A MEAL    metoprolol tartrate (LOPRESSOR) 25 MG tablet Take 1 tablet by mouth twice daily    oxyCODONE-acetaminophen (PERCOCET/ROXICET) 5-325 MG tablet Take 1 tablet by mouth every 4 (four) hours as needed.    pantoprazole (PROTONIX) 40 MG tablet Take 1 tablet by mouth once daily    sertraline (ZOLOFT) 50 MG tablet Take 1 tablet (50 mg total) by  mouth daily.    warfarin (COUMADIN) 10 MG tablet Take 1 tablet daily except 1/2 tablet on Mondays, Wednesdays and Fridays or as directed    [DISCONTINUED] lisinopril (ZESTRIL) 10 MG tablet Take 1 tablet by mouth once daily (Patient taking differently: Take 20 mg by mouth daily.) 04/01/2022: currently taking lisinopril 20mg  daily    [DISCONTINUED] Amino Acids-Protein Hydrolys (FEEDING SUPPLEMENT, PRO-STAT SUGAR FREE 64,) LIQD Take 30 mLs by mouth 3 (three) times daily with meals. (Patient not taking: Reported on 12/22/2021)    [DISCONTINUED] cephALEXin (KEFLEX) 500 MG capsule Take 1 capsule (500 mg total) by mouth 3 (three) times daily.    [DISCONTINUED] ferrous sulfate 325 (65 FE) MG tablet Take 1 tablet (325 mg total) by mouth daily. (Patient not taking: Reported on 04/01/2022)    [DISCONTINUED] sulfamethoxazole-trimethoprim (BACTRIM DS) 800-160 MG tablet Take 1 tablet by mouth 2 (two) times daily. (Patient not taking: Reported on 01/06/2022)    No facility-administered medications prior to visit.    Review of Systems  Constitutional: Negative.  Negative for chills, fever and weight loss.  HENT:  Negative for congestion, ear discharge, ear pain, hearing loss, nosebleeds and tinnitus.   Eyes: Negative.  Negative for blurred vision, double  vision, photophobia, pain and discharge.  Respiratory: Negative.  Negative for cough, hemoptysis, sputum production, shortness of breath and wheezing.   Cardiovascular: Negative.  Negative for chest pain, palpitations, orthopnea, claudication and PND.  Gastrointestinal: Negative.  Negative for abdominal pain, blood in stool, constipation, diarrhea, heartburn, nausea and vomiting.  Genitourinary: Negative.  Negative for dysuria, frequency and urgency.  Musculoskeletal:  Negative for back pain, joint pain, myalgias and neck pain.       Has LBKA  Skin:  Negative for itching.  Neurological: Negative.  Negative for dizziness, tingling, tremors, sensory change, speech  change and headaches.  Endo/Heme/Allergies: Negative.  Negative for environmental allergies and polydipsia. Does not bruise/bleed easily.  Psychiatric/Behavioral: Negative.  Negative for depression, hallucinations, substance abuse and suicidal ideas. The patient is not nervous/anxious and does not have insomnia.           Objective:     BP 135/84 (BP Location: Left Arm, Cuff Size: Large)   Pulse 88   Ht 5\' 10"  (1.778 m)   Wt (!) 350 lb (158.8 kg)   SpO2 95%   BMI 50.22 kg/m    Physical Exam Constitutional:      General: He is not in acute distress.    Appearance: He is obese. He is not ill-appearing, toxic-appearing or diaphoretic.  HENT:     Head: Normocephalic and atraumatic.     Right Ear: Tympanic membrane, ear canal and external ear normal. There is no impacted cerumen.     Left Ear: Tympanic membrane, ear canal and external ear normal. There is no impacted cerumen.     Nose: Nose normal. No congestion or rhinorrhea.     Mouth/Throat:     Mouth: Mucous membranes are moist.     Pharynx: Oropharynx is clear. No oropharyngeal exudate or posterior oropharyngeal erythema.  Eyes:     General: No scleral icterus.       Right eye: No discharge.        Left eye: No discharge.     Extraocular Movements: Extraocular movements intact.     Conjunctiva/sclera: Conjunctivae normal.  Neck:     Vascular: No carotid bruit.  Cardiovascular:     Rate and Rhythm: Normal rate and regular rhythm.     Pulses: Normal pulses.     Heart sounds: Normal heart sounds. No murmur heard.    No friction rub. No gallop.  Pulmonary:     Effort: Pulmonary effort is normal. No respiratory distress.     Breath sounds: Normal breath sounds. No stridor. No wheezing, rhonchi or rales.  Chest:     Chest wall: No tenderness.  Abdominal:     General: There is no distension.     Palpations: Abdomen is soft. There is no mass.     Tenderness: There is no abdominal tenderness. There is no right CVA  tenderness, left CVA tenderness, guarding or rebound.  Musculoskeletal:        General: No swelling, tenderness, deformity or signs of injury.     Cervical back: Normal range of motion and neck supple. No rigidity or tenderness.     Right lower leg: No edema.     Comments: LBKA wrapped in a retention sock, he declined examination of the stump today pt sitting in a wheelchair   Lymphadenopathy:     Cervical: No cervical adenopathy.  Skin:    General: Skin is warm and dry.     Capillary Refill: Capillary refill takes less than 2 seconds.  Coloration: Skin is not jaundiced or pale.     Findings: No bruising, erythema or lesion.  Neurological:     Mental Status: He is alert and oriented to person, place, and time.     Cranial Nerves: No cranial nerve deficit.     Sensory: No sensory deficit.     Motor: No weakness.     Coordination: Coordination normal.     Deep Tendon Reflexes: Reflexes normal.  Psychiatric:        Mood and Affect: Mood normal.        Behavior: Behavior normal.        Thought Content: Thought content normal.        Judgment: Judgment normal.      No results found for any visits on 04/01/22.     Assessment & Plan:    Routine Health Maintenance and Physical Exam  Immunization History  Administered Date(s) Administered   Influenza Split 07/01/2015, 06/30/2016   Influenza,inj,Quad PF,6+ Mos 06/30/2017, 07/30/2018, 06/12/2019, 06/07/2020, 08/29/2021   Moderna Sars-Covid-2 Vaccination 01/03/2020, 02/05/2020, 09/17/2020   Pneumococcal Polysaccharide-23 11/04/2020   Tdap 06/11/2019    Health Maintenance  Topic Date Due   COLONOSCOPY (Pts 45-56yrs Insurance coverage will need to be confirmed)  Never done   COVID-19 Vaccine (4 - Moderna series) 04/26/2022 (Originally 11/12/2020)   INFLUENZA VACCINE  04/27/2022   HEMOGLOBIN A1C  04/29/2022   FOOT EXAM  10/19/2022   OPHTHALMOLOGY EXAM  11/24/2022   TETANUS/TDAP  06/10/2029   Hepatitis C Screening  Completed    HIV Screening  Completed   HPV VACCINES  Aged Out    Discussed health benefits of physical activity, and encouraged him to engage in regular exercise appropriate for his age and condition.  Problem List Items Addressed This Visit       Cardiovascular and Mediastinum   HTN (hypertension)    BP Readings from Last 3 Encounters:  04/01/22 135/84  02/02/22 (!) 162/93  01/25/22 118/79  Condition fairly controlled On metoprolol 25 mg twice daily, lisinopril 20 mg daily.  Continue  current medications DASH diet advised Monitor blood pressure at home BP goal is less than 130/80 Follow-up in 4 months      Relevant Medications   lisinopril (ZESTRIL) 20 MG tablet     Endocrine   Controlled diabetes mellitus type 2 with complications (HCC)    Lab Results  Component Value Date   HGBA1C 6.4 (H) 10/30/2021  On metformin 500 mg twice daily Reports fasting CBG between 130-180 he denies hypoglycemic episodes Check A1c avoid sugar sweets soda To date with diabetic eye exam, foot exam Urine microalbumin ordered      Relevant Medications   lisinopril (ZESTRIL) 20 MG tablet   Other Relevant Orders   Urine microalbumin-creatinine with uACR   HgB A1c   Hyperlipidemia associated with type 2 diabetes mellitus (HCC)    Currently taking atorvastatin 80 mg daily Check lipid panel. The 10-year ASCVD risk score (Arnett DK, et al., 2019) is: 4%   Values used to calculate the score:     Age: 66 years     Sex: Male     Is Non-Hispanic African American: No     Diabetic: Yes     Tobacco smoker: No     Systolic Blood Pressure: 135 mmHg     Is BP treated: Yes     HDL Cholesterol: 40 mg/dL     Total Cholesterol: 150 mg/dL Lab Results  Component Value Date   CHOL  150 06/04/2021   HDL 40 06/04/2021   LDLCALC 88 06/04/2021   TRIG 122 06/04/2021   CHOLHDL 3.4 07/17/2020        Relevant Medications   lisinopril (ZESTRIL) 20 MG tablet   Other Relevant Orders   Lipid Profile     Other    Annual physical exam - Primary    Annual exam as documented.  Counseling done include healthy lifestyle involving committing to 150 minutes of exercise per wee as tolerated , heart healthy diet, and attaining healthy weight. The importance of adequate sleep also discussed.  Regular use of seat belt and home safety were also discussed . Changes in health habits are decided on by patient with goals and time frames set for achieving them Cologuard ordered today .       Relevant Orders   TSH   Vitamin D (25 hydroxy)   Iron deficiency anemia    HGB level one month a ago was 10.6 Check CBC, iron panel Patient to to start taking ferrous sulfate 325 mg daily as he has stopped taking them he Lab Results  Component Value Date   WBC 10.4 01/06/2022   HGB 7.1 (L) 01/06/2022   HCT 24.8 (L) 01/06/2022   MCV 76.1 (L) 01/06/2022   PLT 342 01/06/2022        Relevant Medications   ferrous sulfate 325 (65 FE) MG tablet   Other Relevant Orders   CBC   Fe+TIBC+Fer   Other Visit Diagnoses     Screening for colon cancer       Relevant Orders   Cologuard      Return in about 4 months (around 08/02/2022) for HTN/DM.     Donell Beers, FNP

## 2022-04-01 NOTE — Assessment & Plan Note (Addendum)
BP Readings from Last 3 Encounters:  04/01/22 135/84  02/02/22 (!) 162/93  01/25/22 118/79  Condition fairly controlled On metoprolol 25 mg twice daily, lisinopril 20 mg daily.  Continue  current medications DASH diet advised Monitor blood pressure at home BP goal is less than 130/80 Follow-up in 4 months

## 2022-04-01 NOTE — Assessment & Plan Note (Addendum)
Lab Results  Component Value Date   HGBA1C 6.4 (H) 10/30/2021  On metformin 500 mg twice daily Reports fasting CBG between 130-180 he denies hypoglycemic episodes Check A1c avoid sugar sweets soda To date with diabetic eye exam, foot exam Urine microalbumin ordered

## 2022-04-01 NOTE — Assessment & Plan Note (Addendum)
HGB level one month a ago was 10.6 Check CBC, iron panel Patient to to start taking ferrous sulfate 325 mg daily as he has stopped taking them he Lab Results  Component Value Date   WBC 10.4 01/06/2022   HGB 7.1 (L) 01/06/2022   HCT 24.8 (L) 01/06/2022   MCV 76.1 (L) 01/06/2022   PLT 342 01/06/2022

## 2022-04-01 NOTE — Patient Instructions (Addendum)
  Please start taking your iron supplement ferous sulphate 325mg  one tablet daily   It is important that you exercise regularly at least 30 minutes 5 times a week as tolerated Think about what you will eat, plan ahead. Choose " clean, green, fresh or frozen" over canned, processed or packaged foods which are more sugary, salty and fatty. 70 to 75% of food eaten should be vegetables and fruit. Three meals at set times with snacks allowed between meals, but they must be fruit or vegetables. Aim to eat over a 12 hour period , example 7 am to 7 pm, and STOP after  your last meal of the day. Drink water,generally about 64 ounces per day, no other drink is as healthy. Fruit juice is best enjoyed in a healthy way, by EATING the fruit.  Thanks for choosing [facility], we consider it a privelige to serve you.

## 2022-04-01 NOTE — Assessment & Plan Note (Signed)
Currently taking atorvastatin 80 mg daily Check lipid panel. The 10-year ASCVD risk score (Arnett DK, et al., 2019) is: 4%   Values used to calculate the score:     Age: 46 years     Sex: Male     Is Non-Hispanic African American: No     Diabetic: Yes     Tobacco smoker: No     Systolic Blood Pressure: 135 mmHg     Is BP treated: Yes     HDL Cholesterol: 40 mg/dL     Total Cholesterol: 150 mg/dL Lab Results  Component Value Date   CHOL 150 06/04/2021   HDL 40 06/04/2021   LDLCALC 88 06/04/2021   TRIG 122 06/04/2021   CHOLHDL 3.4 07/17/2020

## 2022-04-01 NOTE — Assessment & Plan Note (Signed)
Annual exam as documented.  Counseling done include healthy lifestyle involving committing to 150 minutes of exercise per wee as tolerated , heart healthy diet, and attaining healthy weight. The importance of adequate sleep also discussed.  Regular use of seat belt and home safety were also discussed . Changes in health habits are decided on by patient with goals and time frames set for achieving them Cologuard ordered today .

## 2022-04-05 ENCOUNTER — Encounter: Payer: 59 | Attending: Physical Medicine and Rehabilitation | Admitting: Physical Medicine and Rehabilitation

## 2022-04-12 ENCOUNTER — Ambulatory Visit (INDEPENDENT_AMBULATORY_CARE_PROVIDER_SITE_OTHER): Payer: 59 | Admitting: *Deleted

## 2022-04-12 DIAGNOSIS — Z5181 Encounter for therapeutic drug level monitoring: Secondary | ICD-10-CM

## 2022-04-12 DIAGNOSIS — Z952 Presence of prosthetic heart valve: Secondary | ICD-10-CM | POA: Diagnosis not present

## 2022-04-12 LAB — POCT INR: INR: 2.3 (ref 2.0–3.0)

## 2022-04-12 NOTE — Patient Instructions (Signed)
Continue warfarin 1 tablet daily except 1/2 tablet on Mondays, Wednesday and Fridays Recheck in 4 wks

## 2022-04-18 ENCOUNTER — Other Ambulatory Visit: Payer: Self-pay | Admitting: Nurse Practitioner

## 2022-04-28 ENCOUNTER — Other Ambulatory Visit: Payer: Self-pay | Admitting: Nurse Practitioner

## 2022-04-28 DIAGNOSIS — E1169 Type 2 diabetes mellitus with other specified complication: Secondary | ICD-10-CM

## 2022-04-28 DIAGNOSIS — D508 Other iron deficiency anemias: Secondary | ICD-10-CM

## 2022-04-28 DIAGNOSIS — E1165 Type 2 diabetes mellitus with hyperglycemia: Secondary | ICD-10-CM

## 2022-04-28 DIAGNOSIS — E559 Vitamin D deficiency, unspecified: Secondary | ICD-10-CM

## 2022-04-28 MED ORDER — VITAMIN D (ERGOCALCIFEROL) 1.25 MG (50000 UNIT) PO CAPS: 50000.0000 [IU] | ORAL_CAPSULE | ORAL | 0 refills | Status: DC

## 2022-04-28 MED ORDER — EZETIMIBE 10 MG PO TABS: 10.0000 mg | ORAL_TABLET | Freq: Every day | ORAL | 3 refills | Status: DC

## 2022-04-28 MED ORDER — METFORMIN HCL 500 MG PO TABS: 1000.0000 mg | ORAL_TABLET | Freq: Two times a day (BID) | ORAL | 0 refills | Status: DC

## 2022-04-28 NOTE — Progress Notes (Signed)
Iron deficiency anemia.  Patient should start taking ferrous sulfate 325 mg once daily as discussed Hyperlipidemia LDL is not at goal of less than 70, continue current dose of atorvastatin 80 mg daily start Zetia 10 mg daily.  A1c is worse now 8.6 from 6.4.  Patient should start taking metformin 1000 mg twice daily avoid sugar sweets soda  Vitamin D level is low patient should start taking vitamin D 50,000 units once weekly for 8 weeks  Schedule a follow-up in 2 months for hyperlipidemia patient should have fasting labs done 3 to 5 days before that visit

## 2022-04-29 LAB — CBC
Hematocrit: 41.3 % (ref 37.5–51.0)
Hemoglobin: 13.1 g/dL (ref 13.0–17.7)
MCH: 24.9 pg — ABNORMAL LOW (ref 26.6–33.0)
MCHC: 31.7 g/dL (ref 31.5–35.7)
MCV: 79 fL (ref 79–97)
Platelets: 257 10*3/uL (ref 150–450)
RBC: 5.26 x10E6/uL (ref 4.14–5.80)
RDW: 18 % — ABNORMAL HIGH (ref 11.6–15.4)
WBC: 9.3 10*3/uL (ref 3.4–10.8)

## 2022-04-29 LAB — LIPID PANEL
Chol/HDL Ratio: 6.5 ratio — ABNORMAL HIGH (ref 0.0–5.0)
Cholesterol, Total: 242 mg/dL — ABNORMAL HIGH (ref 100–199)
HDL: 37 mg/dL — ABNORMAL LOW (ref >39)
LDL Chol Calc (NIH): 138 mg/dL — ABNORMAL HIGH (ref 0–99)
Triglycerides: 368 mg/dL — ABNORMAL HIGH (ref 0–149)
VLDL Cholesterol Cal: 67 mg/dL — ABNORMAL HIGH (ref 5–40)

## 2022-04-29 LAB — IRON,TIBC AND FERRITIN PANEL
Ferritin: 69 ng/mL (ref 30–400)
Iron Saturation: 8 % — CL (ref 15–55)
Iron: 29 ug/dL — ABNORMAL LOW (ref 38–169)
Total Iron Binding Capacity: 386 ug/dL (ref 250–450)
UIBC: 357 ug/dL — ABNORMAL HIGH (ref 111–343)

## 2022-04-29 LAB — HEMOGLOBIN A1C
Est. average glucose Bld gHb Est-mCnc: 200 mg/dL
Hgb A1c MFr Bld: 8.6 % — ABNORMAL HIGH (ref 4.8–5.6)

## 2022-04-29 LAB — MICROALBUMIN / CREATININE URINE RATIO
Creatinine, Urine: 181.3 mg/dL
Microalb/Creat Ratio: 2 mg/g creat (ref 0–29)
Microalbumin, Urine: 4.2 ug/mL

## 2022-04-29 LAB — TSH: TSH: 2.52 u[IU]/mL (ref 0.450–4.500)

## 2022-04-29 LAB — VITAMIN D 25 HYDROXY (VIT D DEFICIENCY, FRACTURES): Vit D, 25-Hydroxy: 15.4 ng/mL — ABNORMAL LOW (ref 30.0–100.0)

## 2022-05-03 DIAGNOSIS — S88112A Complete traumatic amputation at level between knee and ankle, left lower leg, initial encounter: Secondary | ICD-10-CM

## 2022-05-10 ENCOUNTER — Ambulatory Visit (INDEPENDENT_AMBULATORY_CARE_PROVIDER_SITE_OTHER): Payer: 59 | Admitting: *Deleted

## 2022-05-10 ENCOUNTER — Ambulatory Visit: Payer: 59 | Admitting: Physical Therapy

## 2022-05-10 DIAGNOSIS — Z5181 Encounter for therapeutic drug level monitoring: Secondary | ICD-10-CM

## 2022-05-10 DIAGNOSIS — Z952 Presence of prosthetic heart valve: Secondary | ICD-10-CM | POA: Diagnosis not present

## 2022-05-10 LAB — POCT INR
INR: 3.2 — AB (ref 2.0–3.0)
INR: 3.2 — AB (ref 2.0–3.0)

## 2022-05-10 NOTE — Patient Instructions (Signed)
Hold warfarin tonight then resume 1 tablet daily except 1/2 tablet on Mondays, Wednesday and Fridays Recheck in 3 wks

## 2022-05-11 ENCOUNTER — Ambulatory Visit: Payer: 59 | Attending: Family

## 2022-05-11 DIAGNOSIS — R262 Difficulty in walking, not elsewhere classified: Secondary | ICD-10-CM | POA: Diagnosis present

## 2022-05-11 DIAGNOSIS — R2681 Unsteadiness on feet: Secondary | ICD-10-CM | POA: Insufficient documentation

## 2022-05-11 DIAGNOSIS — M6281 Muscle weakness (generalized): Secondary | ICD-10-CM | POA: Insufficient documentation

## 2022-05-11 DIAGNOSIS — S88112A Complete traumatic amputation at level between knee and ankle, left lower leg, initial encounter: Secondary | ICD-10-CM | POA: Insufficient documentation

## 2022-05-11 NOTE — Therapy (Signed)
OUTPATIENT PHYSICAL THERAPY NEURO EVALUATION   Patient Name: Adam Long MRN: [id number] DOB:26-Jan-1976, 46 y.o., male Today's Date: 05/11/2022   PCP: Edwin Dada REFERRING PROVIDER: Adonis Huguenin, NP   PT End of Session - 05/11/22 1144     Visit Number 1    Number of Visits 16    Date for PT Re-Evaluation 07/06/22    Authorization Type [employer]    PT Start Time 1145    PT Stop Time 1230    PT Time Calculation (min) 45 min    Equipment Utilized During Treatment Gait belt    Activity Tolerance Patient tolerated treatment well;Patient limited by fatigue    Behavior During Therapy [facility] for tasks assessed/performed             Past Medical History:  Diagnosis Date   Anxiety    Aortic stenosis    Cellulitis and abscess of lower extremity 06/11/2019   Cellulitis of fourth toe of left foot    Cholelithiasis    Coronary artery disease    Nonobstructive CAD (40-50% LAD) 08/2019   Depression    Diabetes mellitus without complication (HCC)    Phreesia 09/27/2020   Elevated troponin level not due myocardial infarction 11/11/2019   Essential hypertension    Gangrene of toe of left foot (HCC) 07/06/2019   Heart murmur    Phreesia 09/27/2020   Hyperlipidemia    Phreesia 09/27/2020   Hypertension    Phreesia 09/27/2020   Mixed hyperlipidemia    Morbid obesity (HCC)    S/P aortic valve replacement with mechanical valve 12/05/2019   25 mm Carbomedics top hat bileaflet mechanical valve via partial upper hemi-sternotomy   Severe aortic stenosis 09/24/2019   Type 2 diabetes mellitus (HCC)    Past Surgical History:  Procedure Laterality Date   ABDOMINAL AORTOGRAM W/LOWER EXTREMITY N/A 07/06/2019   Procedure: ABDOMINAL AORTOGRAM W/LOWER EXTREMITY;  Surgeon: Sherren Kerns, MD;  Location: [facility] INVASIVE CV LAB;  Service: Cardiovascular;  Laterality: N/A;  Bilateral   AMPUTATION Left 07/09/2019   Procedure: LEFT FOURTH and Fifth TOE AMPUTATION.;  Surgeon:  Larina Earthly, MD;  Location: [facility] OR;  Service: Vascular;  Laterality: Left;   AMPUTATION Left 03/11/2021   Procedure: LEFT FOOT 5TH  AND 4TH RAY AMPUTATION;  Surgeon: Nadara Mustard, MD;  Location: [facility] OR;  Service: Orthopedics;  Laterality: Left;   AMPUTATION Left 08/28/2021   Procedure: AMPUTATION BELOW KNEE;  Surgeon: Nadara Mustard, MD;  Location: [facility] OR;  Service: Orthopedics;  Laterality: Left;   AORTIC VALVE REPLACEMENT N/A 12/05/2019   Procedure: PARTIAL STERNOTOMY FOR AORTIC VALVE REPLACEMENT (AVR), USING CARBOMEDICS SUPRA-ANNULAR TOP HAT ;  Surgeon: Purcell Nails, MD;  Location: [facility] OR;  Service: Open Heart Surgery;  Laterality: N/A;  No neck lines on left   APPLICATION OF WOUND VAC Left 10/30/2021   Procedure: APPLICATION OF WOUND VAC;  Surgeon: Nadara Mustard, MD;  Location: [facility] OR;  Service: Orthopedics;  Laterality: Left;   APPLICATION OF WOUND VAC Left 12/25/2021   Procedure: APPLICATION OF WOUND VAC;  Surgeon: Nadara Mustard, MD;  Location: [facility] OR;  Service: Orthopedics;  Laterality: Left;   CARDIAC VALVE REPLACEMENT N/A    Phreesia 09/27/2020   IR RADIOLOGY PERIPHERAL GUIDED IV START  10/05/2019   IR US GUIDE VASC ACCESS RIGHT  10/05/2019   MULTIPLE EXTRACTIONS WITH ALVEOLOPLASTY N/A 10/26/2019   Procedure: EXTRACTION OF TOOTH #'S 3, 5-11,19-28,  AND 32 WITH ALVEOLOPLASTY;  Surgeon: Charlynne Pander, DDS;  Location: [facility] OR;  Service: Oral Surgery;  Laterality: N/A;   RIGHT HEART CATH AND CORONARY ANGIOGRAPHY N/A 09/24/2019   Procedure: RIGHT HEART CATH AND CORONARY ANGIOGRAPHY;  Surgeon: Tonny Bollman, MD;  Location: [facility] INVASIVE CV LAB;  Service: Cardiovascular;  Laterality: N/A;   STUMP REVISION Left 10/30/2021   Procedure: REVISION LEFT BELOW KNEE AMPUTATION;  Surgeon: Nadara Mustard, MD;  Location: [facility] OR;  Service: Orthopedics;  Laterality: Left;   STUMP REVISION Left 12/25/2021   Procedure: REVISION LEFT BELOW KNEE AMPUTATION;  Surgeon: Nadara Mustard, MD;  Location: [facility] OR;   Service: Orthopedics;  Laterality: Left;   TEE WITHOUT CARDIOVERSION N/A 12/05/2019   Procedure: TRANSESOPHAGEAL ECHOCARDIOGRAM (TEE);  Surgeon: Purcell Nails, MD;  Location: [facility] OR;  Service: Open Heart Surgery;  Laterality: N/A;   TEE WITHOUT CARDIOVERSION N/A 09/01/2021   Procedure: TRANSESOPHAGEAL ECHOCARDIOGRAM (TEE);  Surgeon: Little Ishikawa, MD;  Location: [facility] ENDOSCOPY;  Service: Cardiovascular;  Laterality: N/A;   Patient Active Problem List   Diagnosis Date Noted   Annual physical exam 04/01/2022   Iron deficiency anemia 04/01/2022   S/P BKA (below knee amputation), left (HCC) 10/30/2021   History of below knee amputation, left (HCC) 10/30/2021   Dehiscence of amputation stump of left lower extremity (HCC)    Below-knee amputation of left lower extremity (HCC) 09/02/2021   Severe protein-calorie malnutrition (HCC)    MSSA bacteremia    Ulcer of left foot (HCC)    Subacute osteomyelitis, left ankle and foot (HCC)    Diabetic foot infection (HCC) 02/14/2021   Diabetic ulcer of left foot associated with type 1 diabetes mellitus, limited to breakdown of skin (HCC)    Leukocytosis 02/13/2021   Hyponatremia 02/13/2021   Normocytic anemia 02/13/2021   Hyperglycemia due to diabetes mellitus (HCC) 02/13/2021   Open wound of left foot 02/12/2021   Left leg swelling 02/02/2021   History of complete ray amputation of fifth toe of left foot (HCC) 11/04/2020   Immunization due 11/04/2020   Anxiety 09/30/2020   Preventative health care 12/13/2019   S/P aortic valve replacement with mechanical valve 12/05/2019   S/P AVR (aortic valve replacement) 12/05/2019   Syncope 11/10/2019   Personal history of noncompliance with medical treatment, presenting hazards to health 03/16/2018   Controlled diabetes mellitus type 2 with complications (HCC) 10/12/2015   Hyperlipidemia associated with type 2 diabetes mellitus (HCC) 10/12/2015   Morbid obesity (HCC) 10/12/2015   Chest pain  07/02/2013   HTN (hypertension) 07/02/2013    ONSET DATE: 08/2021-10/2021  REFERRING DIAG: J85.631S (ICD-10-CM) - Below-knee amputation of left lower extremity (HCC)   THERAPY DIAG:  Difficulty in walking, not elsewhere classified  Unsteadiness on feet  Muscle weakness (generalized)  Rationale for Evaluation and Treatment Rehabilitation  SUBJECTIVE:  SUBJECTIVE STATEMENT: Underwent initial left BKA 08/2021 and had complications with wound healing which delayed getting/using LE prosthetic and now has his prosthetic and been using it daily for the past month or so.  Pt accompanied by: self  PERTINENT HISTORY: DM, Patient is a K3 level ambulator that spends a lot of time walking around on uneven terrain over obstacles, up and down stairs, and ambulates with a variable cadence   PAIN:  Are you having pain? No  PRECAUTIONS: None  WEIGHT BEARING RESTRICTIONS No  FALLS: Has patient fallen in last 6 months? No  LIVING ENVIRONMENT: Lives with: lives with an adult companion Lives in: House/apartment Stairs: Yes: External: 2-3 steps; on right going up Has following equipment at home: Single point cane, Environmental consultant - 2 wheeled, Wheelchair (manual), and Tour manager  PLOF: Independent with household mobility with device and Independent with community mobility with device  PATIENT GOALS be able to walk without RW, a cane would be OK. Be able to go fishing at pond at the house  OBJECTIVE:   DIAGNOSTIC FINDINGS: unrevealing  COGNITION: Overall cognitive status: Within functional limits for tasks assessed   SENSATION: WFL  COORDINATION: WNL  EDEMA:  Pt reports residual limb has been stable with use of shrinkers and propping up  MUSCLE TONE:    MUSCLE LENGTH: No flexion contracture  LLE  DTRs:    POSTURE:   LOWER EXTREMITY ROM:     WNL,   LOWER EXTREMITY MMT:    RLE 5/5 Left knee 5/5 manual tests Left hip abductors: 3-/5  BED MOBILITY:  Independent  TRANSFERS: Assistive device utilized: None  Sit to stand: Complete Independence Stand to sit: Modified independence Chair to chair: Complete Independence Floor:  DNT  RAMP:  Level of Assistance: Modified independence Assistive device utilized: Environmental consultant - 2 wheeled Ramp Comments:   CURB:  Level of Assistance: Supervision Assistive device utilized: Environmental consultant - 2 wheeled Curb Comments:   STAIRS:  Level of Assistance: Modified independence  Stair Negotiation Technique: Step to Pattern with Bilateral Rails  Number of Stairs: 6   Height of Stairs: 4-6"  Comments:   GAIT: Gait pattern: step to pattern, decreased stance time- Left, and decreased stride length Distance walked: household Assistive device utilized: Retail banker - 2 wheeled Level of assistance: Modified independence and SBA Comments: trials w/out AD reveal left partial-Trendelenberg, step-to pattern, difficulty in turning requiring multiple small steps  FUNCTIONAL TESTs:  Timed up and go (TUG): 19.68 w/ RW, 19.03 w/out AD Berg Balance Scale: 38/56 Dynamic Gait Index: 11/24  PATIENT SURVEYS:     PATIENT EDUCATION: Education details: regarding fall risk assessments and gait assessment Person educated: Patient Education method: Explanation Education comprehension: verbalized understanding   HOME EXERCISE PROGRAM: To be initiated    GOALS: Goals reviewed with patient? Yes  SHORT TERM GOALS: Target date: 06/08/2022  Patient will be independent in HEP to improve functional outcomes Baseline: Goal status: INITIAL  2.  Demo modified independent gait x 300 ft with NBQC and/or single point cane wih even step length to facilitate increased efficiency with gait Baseline: step-to pattern using cane Goal status:  INITIAL  3.  Manifest low risk for falls per 12 sec TUG test with least restrictive AD Baseline: 19.68 with RW, 19.03 w/out AD but deviations Goal status: INITIAL   LONG TERM GOALS: Target date: 07/06/2022  Demo independent ambulation over various surfaces/elevations to enable access to all points of home property and enable fishing at pond.  Baseline: step-to pattern level surfaces. Supervision curb ambulation Goal status: INITIAL  2.  Demo low risk for falls per score 50/56 Berg Balance Test to improve safety with functional mobility Baseline: 38/56 Goal status: INITIAL  3.  Manifest improved safety and low risk for falls during ambulation per score 19/24 Dynamic Gait Index Baseline: 11/24 Goal status: INITIAL  4.  Improve LLE hip abduction strength to 5/5 for improved stance phase stability and safety with turning Baseline: 3-/5 Goal status: INITIAL    ASSESSMENT:  CLINICAL IMPRESSION: Patient is a 46 y.o. man who was seen today for physical therapy evaluation and treatment for unsteadiness on feet and difficulty walking s/p left transtibial amputation.  Exhibits difficulty walking and high risk for falls per assessment measures and residual weakness affecting gait kinematics and safety LLE.  Would benefit from PT services to train/instruct in safe mobility and ambulation practices to enable independence with community mobility per PLOF and enable negotiation of various surfaces and improve activity tolerance to facilitate return to employment when able.     OBJECTIVE IMPAIRMENTS Abnormal gait, decreased activity tolerance, decreased balance, decreased endurance, decreased knowledge of use of DME, difficulty walking, decreased strength, improper body mechanics, and prosthetic dependency .   ACTIVITY LIMITATIONS carrying, lifting, bending, squatting, stairs, and locomotion level  PARTICIPATION LIMITATIONS: meal prep, shopping, community activity, occupation, and yard  work  PERSONAL FACTORS Age, Time since onset of injury/illness/exacerbation, and 1 comorbidity: DM  are also affecting patient's functional outcome.   REHAB POTENTIAL: Good  CLINICAL DECISION MAKING: Evolving/moderate complexity  EVALUATION COMPLEXITY: Moderate  PLAN: PT FREQUENCY: 1-2x/week  PT DURATION: 8 weeks  PLANNED INTERVENTIONS: Therapeutic exercises, Therapeutic activity, Neuromuscular re-education, Balance training, Gait training, Patient/Family education, Self Care, Joint mobilization, Stair training, Vestibular training, Canalith repositioning, Prosthetic training, DME instructions, Dry Needling, Electrical stimulation, Spinal mobilization, Taping, and Manual therapy  PLAN FOR NEXT SESSION: stair lift-offs, hip abduction/stance phase strength LLE, bariatric single point cane request   Dion Body, PT 05/11/2022, 1:00 PM

## 2022-05-12 ENCOUNTER — Other Ambulatory Visit: Payer: Self-pay | Admitting: Nurse Practitioner

## 2022-05-18 ENCOUNTER — Telehealth: Payer: Self-pay

## 2022-05-18 ENCOUNTER — Ambulatory Visit: Payer: 59

## 2022-05-18 DIAGNOSIS — R262 Difficulty in walking, not elsewhere classified: Secondary | ICD-10-CM

## 2022-05-18 DIAGNOSIS — M6281 Muscle weakness (generalized): Secondary | ICD-10-CM

## 2022-05-18 DIAGNOSIS — R2681 Unsteadiness on feet: Secondary | ICD-10-CM

## 2022-05-18 NOTE — Telephone Encounter (Signed)
Good afternoon Ms. Vanessa Barbara, NP:  [name] has been under our care for Physical Therapy following your recent referral.  He is excelling very well and quickly with his prosthetic training and I am writing to request an order for a bariatric straight cane on his behalf in order to progress his ambulation capabilities.    If you agree, please submit request in EPIC under MD Order, Other Orders (list bariatric straight cane in comments) and please include the ICD-10 code, MD signature, and NPI. You may also fax to [facility] at [phone].   Thank you, 12:59 PM, 05/18/22 M. Shary Decamp, PT, DPT Physical Therapist- [place] Office Number: [phone]    [facility] [address] [place], [place]  [address] Phone:  [phone] Fax:  [phone]

## 2022-05-18 NOTE — Therapy (Signed)
OUTPATIENT PHYSICAL THERAPY NEURO TREATMENT   Patient Name: Adam Long MRN: [id number] DOB:[birth year], 46 y.o., male Today's Date: 05/18/2022   PCP: Edwin Dada REFERRING PROVIDER: Adonis Huguenin, NP   PT End of Session - 05/18/22 1149     Visit Number 2    Number of Visits 16    Date for PT Re-Evaluation 07/06/22    Authorization Type [employer]    PT Start Time 1148    PT Stop Time 1230    PT Time Calculation (min) 42 min    Equipment Utilized During Treatment Gait belt    Activity Tolerance Patient tolerated treatment well;Patient limited by fatigue    Behavior During Therapy [facility] for tasks assessed/performed             Past Medical History:  Diagnosis Date   Anxiety    Aortic stenosis    Cellulitis and abscess of lower extremity 06/11/2019   Cellulitis of fourth toe of left foot    Cholelithiasis    Coronary artery disease    Nonobstructive CAD (40-50% LAD) 08/2019   Depression    Diabetes mellitus without complication (HCC)    Phreesia 09/27/2020   Elevated troponin level not due myocardial infarction 11/11/2019   Essential hypertension    Gangrene of toe of left foot (HCC) 07/06/2019   Heart murmur    Phreesia 09/27/2020   Hyperlipidemia    Phreesia 09/27/2020   Hypertension    Phreesia 09/27/2020   Mixed hyperlipidemia    Morbid obesity (HCC)    S/P aortic valve replacement with mechanical valve 12/05/2019   25 mm Carbomedics top hat bileaflet mechanical valve via partial upper hemi-sternotomy   Severe aortic stenosis 09/24/2019   Type 2 diabetes mellitus (HCC)    Past Surgical History:  Procedure Laterality Date   ABDOMINAL AORTOGRAM W/LOWER EXTREMITY N/A 07/06/2019   Procedure: ABDOMINAL AORTOGRAM W/LOWER EXTREMITY;  Surgeon: Sherren Kerns, MD;  Location: [facility] INVASIVE CV LAB;  Service: Cardiovascular;  Laterality: N/A;  Bilateral   AMPUTATION Left 07/09/2019   Procedure: LEFT FOURTH and Fifth TOE AMPUTATION.;  Surgeon:  Larina Earthly, MD;  Location: [facility] OR;  Service: Vascular;  Laterality: Left;   AMPUTATION Left 03/11/2021   Procedure: LEFT FOOT 5TH  AND 4TH RAY AMPUTATION;  Surgeon: Nadara Mustard, MD;  Location: [facility] OR;  Service: Orthopedics;  Laterality: Left;   AMPUTATION Left 08/28/2021   Procedure: AMPUTATION BELOW KNEE;  Surgeon: Nadara Mustard, MD;  Location: [facility] OR;  Service: Orthopedics;  Laterality: Left;   AORTIC VALVE REPLACEMENT N/A 12/05/2019   Procedure: PARTIAL STERNOTOMY FOR AORTIC VALVE REPLACEMENT (AVR), USING CARBOMEDICS SUPRA-ANNULAR TOP HAT ;  Surgeon: Purcell Nails, MD;  Location: [facility] OR;  Service: Open Heart Surgery;  Laterality: N/A;  No neck lines on left   APPLICATION OF WOUND VAC Left 10/30/2021   Procedure: APPLICATION OF WOUND VAC;  Surgeon: Nadara Mustard, MD;  Location: [facility] OR;  Service: Orthopedics;  Laterality: Left;   APPLICATION OF WOUND VAC Left 12/25/2021   Procedure: APPLICATION OF WOUND VAC;  Surgeon: Nadara Mustard, MD;  Location: [facility] OR;  Service: Orthopedics;  Laterality: Left;   CARDIAC VALVE REPLACEMENT N/A    Phreesia 09/27/2020   IR RADIOLOGY PERIPHERAL GUIDED IV START  10/05/2019   IR US GUIDE VASC ACCESS RIGHT  10/05/2019   MULTIPLE EXTRACTIONS WITH ALVEOLOPLASTY N/A 10/26/2019   Procedure: EXTRACTION OF TOOTH #'S 3, 5-11,19-28,  AND 32 WITH ALVEOLOPLASTY;  Surgeon: Charlynne Pander, DDS;  Location: [facility] OR;  Service: Oral Surgery;  Laterality: N/A;   RIGHT HEART CATH AND CORONARY ANGIOGRAPHY N/A 09/24/2019   Procedure: RIGHT HEART CATH AND CORONARY ANGIOGRAPHY;  Surgeon: Tonny Bollman, MD;  Location: [facility] INVASIVE CV LAB;  Service: Cardiovascular;  Laterality: N/A;   STUMP REVISION Left 10/30/2021   Procedure: REVISION LEFT BELOW KNEE AMPUTATION;  Surgeon: Nadara Mustard, MD;  Location: [facility] OR;  Service: Orthopedics;  Laterality: Left;   STUMP REVISION Left 12/25/2021   Procedure: REVISION LEFT BELOW KNEE AMPUTATION;  Surgeon: Nadara Mustard, MD;  Location: [facility] OR;   Service: Orthopedics;  Laterality: Left;   TEE WITHOUT CARDIOVERSION N/A 12/05/2019   Procedure: TRANSESOPHAGEAL ECHOCARDIOGRAM (TEE);  Surgeon: Purcell Nails, MD;  Location: [facility] OR;  Service: Open Heart Surgery;  Laterality: N/A;   TEE WITHOUT CARDIOVERSION N/A 09/01/2021   Procedure: TRANSESOPHAGEAL ECHOCARDIOGRAM (TEE);  Surgeon: Little Ishikawa, MD;  Location: [facility] ENDOSCOPY;  Service: Cardiovascular;  Laterality: N/A;   Patient Active Problem List   Diagnosis Date Noted   Annual physical exam 04/01/2022   Iron deficiency anemia 04/01/2022   S/P BKA (below knee amputation), left (HCC) 10/30/2021   History of below knee amputation, left (HCC) 10/30/2021   Dehiscence of amputation stump of left lower extremity (HCC)    Below-knee amputation of left lower extremity (HCC) 09/02/2021   Severe protein-calorie malnutrition (HCC)    MSSA bacteremia    Ulcer of left foot (HCC)    Subacute osteomyelitis, left ankle and foot (HCC)    Diabetic foot infection (HCC) 02/14/2021   Diabetic ulcer of left foot associated with type 1 diabetes mellitus, limited to breakdown of skin (HCC)    Leukocytosis 02/13/2021   Hyponatremia 02/13/2021   Normocytic anemia 02/13/2021   Hyperglycemia due to diabetes mellitus (HCC) 02/13/2021   Open wound of left foot 02/12/2021   Left leg swelling 02/02/2021   History of complete ray amputation of fifth toe of left foot (HCC) 11/04/2020   Immunization due 11/04/2020   Anxiety 09/30/2020   Preventative health care 12/13/2019   S/P aortic valve replacement with mechanical valve 12/05/2019   S/P AVR (aortic valve replacement) 12/05/2019   Syncope 11/10/2019   Personal history of noncompliance with medical treatment, presenting hazards to health 03/16/2018   Controlled diabetes mellitus type 2 with complications (HCC) 10/12/2015   Hyperlipidemia associated with type 2 diabetes mellitus (HCC) 10/12/2015   Morbid obesity (HCC) 10/12/2015   Chest pain  07/02/2013   HTN (hypertension) 07/02/2013    ONSET DATE: 08/2021-10/2021  REFERRING DIAG: J85.631S (ICD-10-CM) - Below-knee amputation of left lower extremity (HCC)   THERAPY DIAG:  Difficulty in walking, not elsewhere classified  Unsteadiness on feet  Muscle weakness (generalized)  Rationale for Evaluation and Treatment Rehabilitation  SUBJECTIVE:  SUBJECTIVE STATEMENT: Feeling pretty good, skin on residual limb is looking good Pt accompanied by: self  PERTINENT HISTORY: DM, Patient is a K3 level ambulator that spends a lot of time walking around on uneven terrain over obstacles, up and down stairs, and ambulates with a variable cadence   PAIN:  Are you having pain? No  PRECAUTIONS: None  WEIGHT BEARING RESTRICTIONS No  FALLS: Has patient fallen in last 6 months? No  LIVING ENVIRONMENT: Lives with: lives with an adult companion Lives in: House/apartment Stairs: Yes: External: 2-3 steps; on right going up Has following equipment at home: Single point cane, Environmental consultant - 2 wheeled, Wheelchair (manual), and Tour manager  PLOF: Independent with household mobility with device and Independent with community mobility with device  PATIENT GOALS be able to walk without RW, a cane would be OK. Be able to go fishing at pond at the house  OBJECTIVE:   TODAY'S TREATMENT: 05/18/22 Activity Comments  Lateral weight shift In // bars  6" stair lift-offs 3x10 In // bars, BUE to no UE  Ball roll out 3x10 In // bars, using anatomic leg  Sidestepping 2 x 2 min   Gait training NBQC (bariatric) w/ SBA-CGA; cues in sequence and left hip extension engagement at initial contact-mid stance. Unsteadiness during turns.  -forward/backward in // bars 2x2 min -3x85 ft throughout gym  Standing on foam EO/EC 3x15  sec Head turns 5x EO/EC Offset stance 2x15 sec EO/EC    HOME EXERCISE PROGRAM: Access Code: [email or web address] URL: [email or web address] Date: 05/18/2022 Prepared by: Shary Decamp Exercises - Standing Heel Tap on Box (BKA)  - 1 x daily - 7 x weekly - 3 sets - 10 reps - Side Stepping with Unilateral Counter Support  - 1 x daily - 7 x weekly - 3 sets - 10 reps  DIAGNOSTIC FINDINGS: unrevealing  COGNITION: Overall cognitive status: Within functional limits for tasks assessed   SENSATION: WFL  COORDINATION: WNL  EDEMA:  Pt reports residual limb has been stable with use of shrinkers and propping up  MUSCLE TONE:    MUSCLE LENGTH: No flexion contracture LLE  DTRs:    POSTURE:   LOWER EXTREMITY ROM:     WNL,   LOWER EXTREMITY MMT:    RLE 5/5 Left knee 5/5 manual tests Left hip abductors: 3-/5  BED MOBILITY:  Independent  TRANSFERS: Assistive device utilized: None  Sit to stand: Complete Independence Stand to sit: Modified independence Chair to chair: Complete Independence Floor:  DNT  RAMP:  Level of Assistance: Modified independence Assistive device utilized: Environmental consultant - 2 wheeled Ramp Comments:   CURB:  Level of Assistance: Supervision Assistive device utilized: Environmental consultant - 2 wheeled Curb Comments:   STAIRS:  Level of Assistance: Modified independence  Stair Negotiation Technique: Step to Pattern with Bilateral Rails  Number of Stairs: 6   Height of Stairs: 4-6"  Comments:   GAIT: Gait pattern: step to pattern, decreased stance time- Left, and decreased stride length Distance walked: household Assistive device utilized: Retail banker - 2 wheeled Level of assistance: Modified independence and SBA Comments: trials w/out AD reveal left partial-Trendelenberg, step-to pattern, difficulty in turning requiring multiple small steps  FUNCTIONAL TESTs:  Timed up and go (TUG): 19.68 w/ RW, 19.03 w/out AD Berg Balance Scale:  38/56 Dynamic Gait Index: 11/24  PATIENT SURVEYS:     PATIENT EDUCATION: Education details: regarding fall risk assessments and gait assessment Person educated: Patient Education method: Explanation Education comprehension: verbalized understanding  GOALS: Goals reviewed with patient? Yes  SHORT TERM GOALS: Target date: 06/08/2022  Patient will be independent in HEP to improve functional outcomes Baseline: Goal status: INITIAL  2.  Demo modified independent gait x 300 ft with NBQC and/or single point cane wih even step length to facilitate increased efficiency with gait Baseline: step-to pattern using cane Goal status: INITIAL  3.  Manifest low risk for falls per 12 sec TUG test with least restrictive AD Baseline: 19.68 with RW, 19.03 w/out AD but deviations Goal status: INITIAL   LONG TERM GOALS: Target date: 07/06/2022  Demo independent ambulation over various surfaces/elevations to enable access to all points of home property and enable fishing at pond. Baseline: step-to pattern level surfaces. Supervision curb ambulation Goal status: INITIAL  2.  Demo low risk for falls per score 50/56 Berg Balance Test to improve safety with functional mobility Baseline: 38/56 Goal status: INITIAL  3.  Manifest improved safety and low risk for falls during ambulation per score 19/24 Dynamic Gait Index Baseline: 11/24 Goal status: INITIAL  4.  Improve LLE hip abduction strength to 5/5 for improved stance phase stability and safety with turning Baseline: 3-/5 Goal status: INITIAL    ASSESSMENT:  CLINICAL IMPRESSION: Treatment focus on improving LLE stability and stance phase control with various single limb support drills/activities leading to gait training using NBQC w/ cues for left hip extension recruitment for initial contact and mid stance to improve propulsion and encourage even step length. Improved performance by end of session with visible decrease in amount  of ipsilateral hip drop in stance phase. Continued sessions to progress balance and gait to use least restrictive AD with hopes of independent ambulation over various surfaces   OBJECTIVE IMPAIRMENTS Abnormal gait, decreased activity tolerance, decreased balance, decreased endurance, decreased knowledge of use of DME, difficulty walking, decreased strength, improper body mechanics, and prosthetic dependency .   ACTIVITY LIMITATIONS carrying, lifting, bending, squatting, stairs, and locomotion level  PARTICIPATION LIMITATIONS: meal prep, shopping, community activity, occupation, and yard work  PERSONAL FACTORS Age, Time since onset of injury/illness/exacerbation, and 1 comorbidity: DM  are also affecting patient's functional outcome.   REHAB POTENTIAL: Good  CLINICAL DECISION MAKING: Evolving/moderate complexity  EVALUATION COMPLEXITY: Moderate  PLAN: PT FREQUENCY: 1-2x/week  PT DURATION: 8 weeks  PLANNED INTERVENTIONS: Therapeutic exercises, Therapeutic activity, Neuromuscular re-education, Balance training, Gait training, Patient/Family education, Self Care, Joint mobilization, Stair training, Vestibular training, Canalith repositioning, Prosthetic training, DME instructions, Dry Needling, Electrical stimulation, Spinal mobilization, Taping, and Manual therapy  PLAN FOR NEXT SESSION: continue with POC   [employer], PT 05/18/2022, 11:50 AM

## 2022-05-19 MED ORDER — BARIATRIC CANE MISC: 1.0000 | Freq: Once | 0 refills | Status: AC

## 2022-05-19 NOTE — Addendum Note (Signed)
Addended by: Adonis Huguenin on: 05/19/2022 09:03 AM   Modules accepted: Orders

## 2022-05-19 NOTE — Telephone Encounter (Signed)
I think I did this in epic, let me know if its not correct

## 2022-05-20 ENCOUNTER — Ambulatory Visit: Payer: 59

## 2022-05-25 ENCOUNTER — Ambulatory Visit: Payer: 59

## 2022-05-26 ENCOUNTER — Telehealth: Payer: Self-pay | Admitting: Cardiology

## 2022-05-26 NOTE — Telephone Encounter (Signed)
Pt c/o medication issue:  1. Name of Medication: warfarin (COUMADIN) 10 MG tablet  2. How are you currently taking this medication (dosage and times per day)?   3. Are you having a reaction (difficulty breathing--STAT)?   4. What is your medication issue? Pharmacy calling wanting to know if they can change manufacturer

## 2022-05-27 ENCOUNTER — Ambulatory Visit: Payer: 59

## 2022-05-27 NOTE — Telephone Encounter (Signed)
Left Voice mail with [facility] it's OK to change warfarin manufacturer.

## 2022-05-31 ENCOUNTER — Other Ambulatory Visit: Payer: Self-pay | Admitting: Nurse Practitioner

## 2022-05-31 DIAGNOSIS — E1165 Type 2 diabetes mellitus with hyperglycemia: Secondary | ICD-10-CM

## 2022-06-01 ENCOUNTER — Other Ambulatory Visit: Payer: Self-pay | Admitting: Nurse Practitioner

## 2022-06-01 ENCOUNTER — Ambulatory Visit: Payer: Medicaid Other | Attending: Family | Admitting: Physical Therapy

## 2022-06-01 ENCOUNTER — Ambulatory Visit: Payer: Medicaid Other | Attending: Cardiology | Admitting: *Deleted

## 2022-06-01 ENCOUNTER — Encounter: Payer: Self-pay | Admitting: Physical Therapy

## 2022-06-01 DIAGNOSIS — I10 Essential (primary) hypertension: Secondary | ICD-10-CM

## 2022-06-01 DIAGNOSIS — M6281 Muscle weakness (generalized): Secondary | ICD-10-CM | POA: Diagnosis present

## 2022-06-01 DIAGNOSIS — Z5181 Encounter for therapeutic drug level monitoring: Secondary | ICD-10-CM

## 2022-06-01 DIAGNOSIS — Z952 Presence of prosthetic heart valve: Secondary | ICD-10-CM | POA: Diagnosis not present

## 2022-06-01 DIAGNOSIS — R2681 Unsteadiness on feet: Secondary | ICD-10-CM | POA: Diagnosis present

## 2022-06-01 DIAGNOSIS — R262 Difficulty in walking, not elsewhere classified: Secondary | ICD-10-CM | POA: Insufficient documentation

## 2022-06-01 LAB — POCT INR: INR: 5.3 — AB (ref 2.0–3.0)

## 2022-06-01 NOTE — Patient Instructions (Signed)
Hold warfarin tonight and tomorrow night then decrease dose to  1/2 tablet daily except 1 tablet on Sundays, Tuesdays and Thursdays Recheck in 1 wk

## 2022-06-01 NOTE — Therapy (Addendum)
OUTPATIENT PHYSICAL THERAPY NEURO TREATMENT   Patient Name: Adam Long MRN: [id number] DOB:1976-04-10, 46 y.o., male Today's Date: 06/01/2022   PCP: Vena Rua REFERRING PROVIDER: Suzan Slick, NP   PT End of Session - 06/01/22 1148     Visit Number 3    Number of Visits 16    Date for PT Re-Evaluation 07/06/22    Authorization Type [employer]    PT Start Time 1104    PT Stop Time 1143    PT Time Calculation (min) 39 min    Activity Tolerance Patient tolerated treatment well    Behavior During Therapy [facility] for tasks assessed/performed              Past Medical History:  Diagnosis Date   Anxiety    Aortic stenosis    Cellulitis and abscess of lower extremity 06/11/2019   Cellulitis of fourth toe of left foot    Cholelithiasis    Coronary artery disease    Nonobstructive CAD (40-50% LAD) 08/2019   Depression    Diabetes mellitus without complication ([place])    Phreesia 09/27/2020   Elevated troponin level not due myocardial infarction 11/11/2019   Essential hypertension    Gangrene of toe of left foot ([place]) 07/06/2019   Heart murmur    Phreesia 09/27/2020   Hyperlipidemia    Phreesia 09/27/2020   Hypertension    Phreesia 09/27/2020   Mixed hyperlipidemia    Morbid obesity ([place])    S/P aortic valve replacement with mechanical valve 12/05/2019   25 mm Carbomedics top hat bileaflet mechanical valve via partial upper hemi-sternotomy   Severe aortic stenosis 09/24/2019   Type 2 diabetes mellitus ([place])    Past Surgical History:  Procedure Laterality Date   ABDOMINAL AORTOGRAM W/LOWER EXTREMITY N/A 07/06/2019   Procedure: ABDOMINAL AORTOGRAM W/LOWER EXTREMITY;  Surgeon: Elam Dutch, MD;  Location: [place] [facility];  Service: Cardiovascular;  Laterality: N/A;  Bilateral   AMPUTATION Left 07/09/2019   Procedure: LEFT FOURTH and Fifth TOE AMPUTATION.;  Surgeon: Rosetta Posner, MD;  Location: [place];  Service: Vascular;  Laterality: Left;    AMPUTATION Left 03/11/2021   Procedure: LEFT FOOT 5TH  AND 4TH RAY AMPUTATION;  Surgeon: Newt Minion, MD;  Location: [place];  Service: Orthopedics;  Laterality: Left;   AMPUTATION Left 08/28/2021   Procedure: AMPUTATION BELOW KNEE;  Surgeon: Newt Minion, MD;  Location: [place];  Service: Orthopedics;  Laterality: Left;   AORTIC VALVE REPLACEMENT N/A 12/05/2019   Procedure: PARTIAL STERNOTOMY FOR AORTIC VALVE REPLACEMENT (AVR), USING CARBOMEDICS SUPRA-ANNULAR TOP HAT 25MM;  Surgeon: Rexene Alberts, MD;  Location: [place];  Service: Open Heart Surgery;  Laterality: N/A;  No neck lines on left   APPLICATION OF WOUND VAC Left 10/30/2021   Procedure: APPLICATION OF WOUND VAC;  Surgeon: Newt Minion, MD;  Location: [place];  Service: Orthopedics;  Laterality: Left;   APPLICATION OF WOUND VAC Left 12/25/2021   Procedure: APPLICATION OF WOUND VAC;  Surgeon: Newt Minion, MD;  Location: [place];  Service: Orthopedics;  Laterality: Left;   CARDIAC VALVE REPLACEMENT N/A    Phreesia 09/27/2020   IR RADIOLOGY PERIPHERAL GUIDED IV START  10/05/2019   IR US GUIDE VASC ACCESS RIGHT  10/05/2019   MULTIPLE EXTRACTIONS WITH ALVEOLOPLASTY N/A 10/26/2019   Procedure: EXTRACTION OF TOOTH #'S 3, 5-11,19-28,  AND 32 WITH ALVEOLOPLASTY;  Surgeon: Lenn Cal, DDS;  Location: [place];  Service:  Oral Surgery;  Laterality: N/A;   RIGHT HEART CATH AND CORONARY ANGIOGRAPHY N/A 09/24/2019   Procedure: RIGHT HEART CATH AND CORONARY ANGIOGRAPHY;  Surgeon: Sherren Mocha, MD;  Location: [place] [facility];  Service: Cardiovascular;  Laterality: N/A;   STUMP REVISION Left 10/30/2021   Procedure: REVISION LEFT BELOW KNEE AMPUTATION;  Surgeon: Newt Minion, MD;  Location: [place];  Service: Orthopedics;  Laterality: Left;   STUMP REVISION Left 12/25/2021   Procedure: REVISION LEFT BELOW KNEE AMPUTATION;  Surgeon: Newt Minion, MD;  Location: [place];  Service: Orthopedics;  Laterality: Left;   TEE WITHOUT CARDIOVERSION N/A 12/05/2019    Procedure: TRANSESOPHAGEAL ECHOCARDIOGRAM (TEE);  Surgeon: Rexene Alberts, MD;  Location: [place];  Service: Open Heart Surgery;  Laterality: N/A;   TEE WITHOUT CARDIOVERSION N/A 09/01/2021   Procedure: TRANSESOPHAGEAL ECHOCARDIOGRAM (TEE);  Surgeon: Donato Heinz, MD;  Location: [place];  Service: Cardiovascular;  Laterality: N/A;   Patient Active Problem List   Diagnosis Date Noted   Annual physical exam 04/01/2022   Iron deficiency anemia 04/01/2022   S/P BKA (below knee amputation), left ([place]) 10/30/2021   History of below knee amputation, left ([place]) 10/30/2021   Dehiscence of amputation stump of left lower extremity ([place])    Below-knee amputation of left lower extremity ([place]) 09/02/2021   Severe protein-calorie malnutrition ([place])    MSSA bacteremia    Ulcer of left foot ([place])    Subacute osteomyelitis, left ankle and foot ([place])    Diabetic foot infection ([place]) 02/14/2021   Diabetic ulcer of left foot associated with type 1 diabetes mellitus, limited to breakdown of skin ([place])    Leukocytosis 02/13/2021   Hyponatremia 02/13/2021   Normocytic anemia 02/13/2021   Hyperglycemia due to diabetes mellitus ([place]) 02/13/2021   Open wound of left foot 02/12/2021   Left leg swelling 02/02/2021   History of complete ray amputation of fifth toe of left foot ([place]) 11/04/2020   Immunization due 11/04/2020   Anxiety 09/30/2020   Preventative health care 12/13/2019   S/P aortic valve replacement with mechanical valve 12/05/2019   S/P AVR (aortic valve replacement) 12/05/2019   Syncope 11/10/2019   Personal history of noncompliance with medical treatment, presenting hazards to health 03/16/2018   Controlled diabetes mellitus type 2 with complications ([place]) 93/96/8864   Hyperlipidemia associated with type 2 diabetes mellitus ([place]) 10/12/2015   Morbid obesity ([place]) 10/12/2015   Chest pain 07/02/2013   HTN (hypertension) 07/02/2013    ONSET DATE: 08/2021-10/2021  REFERRING DIAG:  G47.207K (ICD-10-CM) - Below-knee amputation of left lower extremity ([place])   THERAPY DIAG:  Difficulty in walking, not elsewhere classified  Unsteadiness on feet  Muscle weakness (generalized)  Rationale for Evaluation and Treatment Rehabilitation  SUBJECTIVE:  SUBJECTIVE STATEMENT: Missed a few visits because of a blister he developed on his residual limb.  Pt accompanied by: self  PERTINENT HISTORY: DM, Patient is a K3 level ambulator that spends a lot of time walking around on uneven terrain over obstacles, up and down stairs, and ambulates with a variable cadence   PAIN:  Are you having pain? No  PRECAUTIONS: None  WEIGHT BEARING RESTRICTIONS No  FALLS: Has patient fallen in last 6 months? No  LIVING ENVIRONMENT: Lives with: lives with an adult companion Lives in: House/apartment Stairs: Yes: External: 2-3 steps; on right going up Has following equipment at home: Single point cane, Environmental consultant - 2 wheeled, Wheelchair (manual), and Electronics engineer  PLOF: Independent with household mobility with device and Independent with community mobility with device  PATIENT GOALS be able to walk without RW, a cane would be OK. Be able to go fishing at pond at the house  OBJECTIVE:     TODAY'S TREATMENT: 06/01/22 Activity Comments  Observation and placement of gauze over inferior end of L residual limb which revealed macerated tissue, redness, and drainage    ant/pos wt shift 10x Cueing to prevent trunk flexion  side to side wt shift 10x  Holding L shift 3 sec; cues to shift hips rather than shoulders   L wt shift + toe tap on 4" step 10x  Good stability   rolling ball under R foot CW/CCW 310x each  1 UE on II bar  romberg  30", with EC 30" Mild-mod sway with EC  trunk rotation to look over each  shoulder 5x, with green medball at chest 5x, tapping medball on II bars Good stability     PATIENT EDUCATION: Education details: observation of L residual limb and educating patient on increased sock use, using wash cloth to wipe sweat off the limb, and use of residual limb antiperspirant  Person educated: Patient Education method: Explanation Education comprehension: verbalized understanding    HOME EXERCISE PROGRAM: Access Code: [email or web address] URL: [email or web address] Date: 05/18/2022 Prepared by: Sherlyn Lees Exercises - Standing Heel Tap on Box (BKA)  - 1 x daily - 7 x weekly - 3 sets - 10 reps - Side Stepping with Unilateral Counter Support  - 1 x daily - 7 x weekly - 3 sets - 10 reps   Below measures were taken at time of initial evaluation unless otherwise specified:  DIAGNOSTIC FINDINGS: unrevealing  COGNITION: Overall cognitive status: Within functional limits for tasks assessed   SENSATION: WFL  COORDINATION: WNL  EDEMA:  Pt reports residual limb has been stable with use of shrinkers and propping up  MUSCLE TONE:    MUSCLE LENGTH: No flexion contracture LLE  DTRs:    POSTURE:   LOWER EXTREMITY ROM:     WNL,   LOWER EXTREMITY MMT:    RLE 5/5 Left knee 5/5 manual tests Left hip abductors: 3-/5  BED MOBILITY:  Independent  TRANSFERS: Assistive device utilized: None  Sit to stand: Complete [place] Stand to sit: Modified independence Chair to chair: Complete [place] Floor:  DNT  RAMP:  Level of Assistance: Modified independence Assistive device utilized: Environmental consultant - 2 wheeled Ramp Comments:   CURB:  Level of Assistance: Supervision Assistive device utilized: Environmental consultant - 2 wheeled Curb Comments:   STAIRS:  Level of Assistance: Modified independence  Stair Negotiation Technique: Step to Pattern with Bilateral Rails  Number of Stairs: 6   Height of Stairs: 4-6"  Comments:   GAIT: Gait pattern: step to pattern,  decreased stance time- Left, and decreased stride length Distance walked: household Assistive device utilized: Gaffer - 2 wheeled Level of assistance: Modified independence and SBA Comments: trials w/out AD reveal left partial-Trendelenberg, step-to pattern, difficulty in turning requiring multiple small steps  FUNCTIONAL TESTs:  Timed up and go (TUG): 19.68 w/ RW, 19.03 w/out AD Berg Balance Scale: 38/56 Dynamic Gait Index: 11/24  PATIENT SURVEYS:     PATIENT EDUCATION: Education details: regarding fall risk assessments and gait assessment Person educated: Patient Education method: Explanation Education comprehension: verbalized understanding       GOALS: Goals reviewed with patient? Yes  SHORT TERM GOALS: Target date: 06/08/2022  Patient will be independent in HEP to improve functional outcomes Baseline: Goal status: IN PROGRESS  2.  Demo modified independent gait x 300 ft with NBQC and/or single point cane wih even step length to facilitate increased efficiency with gait Baseline: step-to pattern using cane Goal status: IN PROGRESS  3.  Manifest low risk for falls per 12 sec TUG test with least restrictive AD Baseline: 19.68 with RW, 19.03 w/out AD but deviations Goal status: IN PROGRESS   LONG TERM GOALS: Target date: 07/06/2022  Demo independent ambulation over various surfaces/elevations to enable access to all points of home property and enable fishing at pond. Baseline: step-to pattern level surfaces. Supervision curb ambulation Goal status: IN PROGRESS  2.  Demo low risk for falls per score 50/56 Berg Balance Test to improve safety with functional mobility Baseline: 38/56 Goal status: IN PROGRESS  3.  Manifest improved safety and low risk for falls during ambulation per score 19/24 Dynamic Gait Index Baseline: 11/24 Goal status: IN PROGRESS  4.  Improve LLE hip abduction strength to 5/5 for improved stance phase stability and  safety with turning Baseline: 3-/5 Goal status: IN PROGRESS    ASSESSMENT:  CLINICAL IMPRESSION: Patient arrived to session with report of missing a few visits last week d/t developing a blister on his residual limb. Upon observation, macerated tissue with redness and drainage evident. No overt signs of infection and patient denied pain. Took time to heavily educate patient on appropriate residual limb care and use of socks. Patient reported understanding. Worked on standing balance activities with cueing to wt shift hips to L side with good effort and tolerance. No complaints at end of session.    OBJECTIVE IMPAIRMENTS Abnormal gait, decreased activity tolerance, decreased balance, decreased endurance, decreased knowledge of use of DME, difficulty walking, decreased strength, improper body mechanics, and prosthetic dependency .   ACTIVITY LIMITATIONS carrying, lifting, bending, squatting, stairs, and locomotion level  PARTICIPATION LIMITATIONS: meal prep, shopping, community activity, occupation, and yard work  PERSONAL FACTORS Age, Time since onset of injury/illness/exacerbation, and 1 comorbidity: DM  are also affecting patient's functional outcome.   REHAB POTENTIAL: Good  CLINICAL DECISION MAKING: Evolving/moderate complexity  EVALUATION COMPLEXITY: Moderate  PLAN: PT FREQUENCY: 1-2x/week  PT DURATION: 8 weeks  PLANNED INTERVENTIONS: Therapeutic exercises, Therapeutic activity, Neuromuscular re-education, Balance training, Gait training, Patient/Family education, Self Care, Joint mobilization, Stair training, Vestibular training, Canalith repositioning, Prosthetic training, DME instructions, Dry Needling, Electrical stimulation, Spinal mobilization, Taping, and Manual therapy  PLAN FOR NEXT SESSION: recheck L limb; continue with POC   Janene Harvey, PT, DPT 06/01/22 11:51 AM  [facility] at [facility] [address] [place], [place] [address] Phone # [phone] Fax # [phone]     PHYSICAL THERAPY DISCHARGE SUMMARY  Visits from  Start of Care: 3  Current functional level related to goals / functional outcomes: Unable to assess; patient did not return   Remaining deficits: Unable to assess   Education / Equipment: HEP  Plan: Patient agrees to discharge.  Patient goals were not met. Patient is being discharged due to not returning.    Janene Harvey, PT, DPT 08/18/22 3:47 PM  [facility] at [facility] [address] [place], [place], [place] [address] Phone # [phone] Fax # [phone]

## 2022-06-03 ENCOUNTER — Ambulatory Visit: Payer: Medicaid Other | Admitting: Physical Therapy

## 2022-06-08 ENCOUNTER — Ambulatory Visit: Payer: Medicaid Other

## 2022-06-10 ENCOUNTER — Ambulatory Visit: Payer: Medicaid Other | Admitting: Physical Therapy

## 2022-06-10 ENCOUNTER — Ambulatory Visit: Payer: Medicaid Other | Attending: Cardiology | Admitting: *Deleted

## 2022-06-10 DIAGNOSIS — Z5181 Encounter for therapeutic drug level monitoring: Secondary | ICD-10-CM | POA: Diagnosis not present

## 2022-06-10 DIAGNOSIS — Z952 Presence of prosthetic heart valve: Secondary | ICD-10-CM | POA: Diagnosis not present

## 2022-06-10 LAB — POCT INR: INR: 2.6 (ref 2.0–3.0)

## 2022-06-10 NOTE — Patient Instructions (Signed)
Continue warfarin 1/2 tablet daily except 1 tablet on Sundays, Tuesdays and Thursdays Recheck in 2 wks

## 2022-06-11 ENCOUNTER — Other Ambulatory Visit: Payer: Self-pay | Admitting: Physical Medicine and Rehabilitation

## 2022-06-11 NOTE — Telephone Encounter (Signed)
Pharmacy sending a request for Gabapentin. He was a no show for his appointment in July and does not have a another follow up scheduled

## 2022-06-15 ENCOUNTER — Ambulatory Visit: Payer: Medicaid Other

## 2022-06-17 ENCOUNTER — Ambulatory Visit: Payer: Medicaid Other

## 2022-06-22 ENCOUNTER — Ambulatory Visit: Payer: 59

## 2022-06-24 ENCOUNTER — Ambulatory Visit: Payer: 59

## 2022-06-25 ENCOUNTER — Ambulatory Visit: Payer: Medicaid Other | Attending: Cardiology | Admitting: *Deleted

## 2022-06-25 DIAGNOSIS — Z5181 Encounter for therapeutic drug level monitoring: Secondary | ICD-10-CM

## 2022-06-25 DIAGNOSIS — Z952 Presence of prosthetic heart valve: Secondary | ICD-10-CM | POA: Diagnosis not present

## 2022-06-25 LAB — POCT INR: INR: 2.9 (ref 2.0–3.0)

## 2022-06-25 NOTE — Patient Instructions (Signed)
Continue warfarin 1/2 tablet daily except 1 tablet on Sundays, Tuesdays and Thursdays Recheck  in 3 wks 

## 2022-06-29 ENCOUNTER — Ambulatory Visit: Payer: 59

## 2022-06-30 ENCOUNTER — Ambulatory Visit: Payer: 59 | Admitting: Nurse Practitioner

## 2022-07-01 ENCOUNTER — Ambulatory Visit: Payer: 59

## 2022-07-02 ENCOUNTER — Ambulatory Visit: Payer: 59 | Admitting: Internal Medicine

## 2022-07-05 ENCOUNTER — Encounter: Payer: Self-pay | Admitting: Nurse Practitioner

## 2022-07-06 ENCOUNTER — Ambulatory Visit: Payer: 59

## 2022-07-08 ENCOUNTER — Ambulatory Visit: Payer: 59

## 2022-07-15 ENCOUNTER — Ambulatory Visit: Payer: Medicaid Other | Attending: Cardiology | Admitting: *Deleted

## 2022-07-15 DIAGNOSIS — Z952 Presence of prosthetic heart valve: Secondary | ICD-10-CM

## 2022-07-15 DIAGNOSIS — Z5181 Encounter for therapeutic drug level monitoring: Secondary | ICD-10-CM

## 2022-07-15 LAB — POCT INR: INR: 2.6 (ref 2.0–3.0)

## 2022-07-15 NOTE — Patient Instructions (Signed)
Continue warfarin 1/2 tablet daily except 1 tablet on Sundays, Tuesdays and Thursdays Recheck in 4 wks 

## 2022-08-05 ENCOUNTER — Ambulatory Visit: Payer: 59 | Admitting: Internal Medicine

## 2022-08-12 ENCOUNTER — Ambulatory Visit: Payer: Medicaid Other | Attending: Cardiology | Admitting: *Deleted

## 2022-08-12 DIAGNOSIS — Z952 Presence of prosthetic heart valve: Secondary | ICD-10-CM

## 2022-08-12 DIAGNOSIS — Z5181 Encounter for therapeutic drug level monitoring: Secondary | ICD-10-CM | POA: Diagnosis not present

## 2022-08-12 LAB — POCT INR: INR: 1.9 — AB (ref 2.0–3.0)

## 2022-08-12 NOTE — Patient Instructions (Signed)
Take warfarin 1 tablet tonight and tomorrow night then resume 1/2 tablet daily except 1 tablet on Sundays, Tuesdays and Thursdays Recheck in 3 wks

## 2022-09-02 ENCOUNTER — Ambulatory Visit: Payer: Medicaid Other | Attending: Cardiology | Admitting: *Deleted

## 2022-09-02 DIAGNOSIS — Z952 Presence of prosthetic heart valve: Secondary | ICD-10-CM

## 2022-09-02 DIAGNOSIS — Z5181 Encounter for therapeutic drug level monitoring: Secondary | ICD-10-CM | POA: Diagnosis not present

## 2022-09-02 LAB — POCT INR: INR: 2.1 (ref 2.0–3.0)

## 2022-09-02 NOTE — Patient Instructions (Signed)
Continue warfarin 1/2 tablet daily except 1 tablet on Sundays, Tuesdays and Thursdays Recheck in 4 wks 

## 2022-09-27 ENCOUNTER — Other Ambulatory Visit: Payer: Self-pay | Admitting: Nurse Practitioner

## 2022-09-27 DIAGNOSIS — E1165 Type 2 diabetes mellitus with hyperglycemia: Secondary | ICD-10-CM

## 2022-09-30 ENCOUNTER — Ambulatory Visit: Payer: Medicaid Other | Attending: Cardiology | Admitting: *Deleted

## 2022-09-30 DIAGNOSIS — Z5181 Encounter for therapeutic drug level monitoring: Secondary | ICD-10-CM | POA: Diagnosis not present

## 2022-09-30 DIAGNOSIS — Z952 Presence of prosthetic heart valve: Secondary | ICD-10-CM | POA: Diagnosis not present

## 2022-09-30 LAB — POCT INR: INR: 2 (ref 2.0–3.0)

## 2022-09-30 NOTE — Patient Instructions (Signed)
Continue warfarin 1/2 tablet daily except 1 tablet on Sundays, Tuesdays and Thursdays Recheck in 4 wks 

## 2022-10-12 ENCOUNTER — Ambulatory Visit (INDEPENDENT_AMBULATORY_CARE_PROVIDER_SITE_OTHER): Payer: Medicaid Other | Admitting: Family Medicine

## 2022-10-12 ENCOUNTER — Encounter: Payer: Self-pay | Admitting: *Deleted

## 2022-10-12 ENCOUNTER — Encounter: Payer: Self-pay | Admitting: Family Medicine

## 2022-10-12 DIAGNOSIS — F419 Anxiety disorder, unspecified: Secondary | ICD-10-CM | POA: Diagnosis not present

## 2022-10-12 DIAGNOSIS — E1165 Type 2 diabetes mellitus with hyperglycemia: Secondary | ICD-10-CM | POA: Insufficient documentation

## 2022-10-12 DIAGNOSIS — I10 Essential (primary) hypertension: Secondary | ICD-10-CM | POA: Diagnosis not present

## 2022-10-12 DIAGNOSIS — Z5181 Encounter for therapeutic drug level monitoring: Secondary | ICD-10-CM | POA: Insufficient documentation

## 2022-10-12 DIAGNOSIS — Z1211 Encounter for screening for malignant neoplasm of colon: Secondary | ICD-10-CM | POA: Insufficient documentation

## 2022-10-12 DIAGNOSIS — Z8739 Personal history of other diseases of the musculoskeletal system and connective tissue: Secondary | ICD-10-CM | POA: Insufficient documentation

## 2022-10-12 DIAGNOSIS — E785 Hyperlipidemia, unspecified: Secondary | ICD-10-CM

## 2022-10-12 DIAGNOSIS — E1169 Type 2 diabetes mellitus with other specified complication: Secondary | ICD-10-CM

## 2022-10-12 DIAGNOSIS — I2510 Atherosclerotic heart disease of native coronary artery without angina pectoris: Secondary | ICD-10-CM

## 2022-10-12 DIAGNOSIS — L989 Disorder of the skin and subcutaneous tissue, unspecified: Secondary | ICD-10-CM | POA: Insufficient documentation

## 2022-10-12 DIAGNOSIS — D508 Other iron deficiency anemias: Secondary | ICD-10-CM

## 2022-10-12 MED ORDER — PANTOPRAZOLE SODIUM 40 MG PO TBEC: 40.0000 mg | DELAYED_RELEASE_TABLET | Freq: Every day | ORAL | 3 refills | Status: DC

## 2022-10-12 MED ORDER — LISINOPRIL 20 MG PO TABS: 20.0000 mg | ORAL_TABLET | Freq: Every day | ORAL | 3 refills | Status: DC

## 2022-10-12 MED ORDER — GABAPENTIN 300 MG PO CAPS: 600.0000 mg | ORAL_CAPSULE | Freq: Two times a day (BID) | ORAL | 3 refills | Status: DC

## 2022-10-12 MED ORDER — EZETIMIBE 10 MG PO TABS: 10.0000 mg | ORAL_TABLET | Freq: Every day | ORAL | 3 refills | Status: DC

## 2022-10-12 MED ORDER — SERTRALINE HCL 50 MG PO TABS: 50.0000 mg | ORAL_TABLET | Freq: Every day | ORAL | 3 refills | Status: DC

## 2022-10-12 MED ORDER — METFORMIN HCL 500 MG PO TABS: 1000.0000 mg | ORAL_TABLET | Freq: Two times a day (BID) | ORAL | 3 refills | Status: DC

## 2022-10-12 MED ORDER — METOPROLOL TARTRATE 25 MG PO TABS: 25.0000 mg | ORAL_TABLET | Freq: Two times a day (BID) | ORAL | 3 refills | Status: DC

## 2022-10-12 NOTE — Assessment & Plan Note (Signed)
Needs colon cancer screening.  Referring to GI.

## 2022-10-12 NOTE — Patient Instructions (Signed)
Labs at [facility].  Referrals placed.  Medications refilled.  Follow up in 3 months.

## 2022-10-12 NOTE — Assessment & Plan Note (Signed)
Concern for precancerous lesions.  Referring to dermatology.

## 2022-10-12 NOTE — Progress Notes (Signed)
Subjective:  Patient ID: Adam Long, male    DOB: [birth year]  Age: 47 y.o. MRN: [id number]  CC: Chief Complaint  Patient presents with   Establish Care    Medication refills    HPI:  47 year old male with an extensive past medical history including history of severe aortic stenosis status post replacement with mechanical valve, uncontrolled type 2 diabetes, history of osteomyelitis and subsequent amputation (left BKA), nonobstructive coronary artery disease, hyperlipidemia, morbid obesity, anemia, and hypertension presents to establish care.  Patient is establishing with me as his new primary care physician.  BP well-controlled today.  He is on lisinopril and metoprolol.  Has a history of coronary artery disease.  He is on long-term warfarin due to mechanical heart valve.  Patient's last labs were obtained in August 2023.  A1c has been uncontrolled.  He has been out of his medication.  Requesting refill of metformin.  Last labs reflect that his lipids are uncontrolled as well.  He is on Zetia.  He has been on Lipitor in the past.  I am not sure why he is not on this medication.  Patient is not having any issues with his prosthesis.  No skin breakdown.  Patient has never had a colonoscopy.  Will discuss today.  Patient Active Problem List   Diagnosis Date Noted   History of osteomyelitis 10/12/2022   Uncontrolled type 2 diabetes mellitus with hyperglycemia ([place]) 10/12/2022   CAD (coronary artery disease) 10/12/2022   Facial skin lesion 10/12/2022   Encounter for screening colonoscopy 10/12/2022   Iron deficiency anemia 04/01/2022   S/P BKA (below knee amputation), left ([place]) 10/30/2021   Anxiety 09/30/2020   Preventative health care 12/13/2019   S/P aortic valve replacement with mechanical valve 12/05/2019   Hyperlipidemia associated with type 2 diabetes mellitus ([place]) 10/12/2015   Morbid obesity ([place]) 10/12/2015   HTN (hypertension) 07/02/2013    Social Hx   Social  History   Socioeconomic History   Marital status: Single    Spouse name: Not on file   Number of children: Not on file   Years of education: Not on file   Highest education level: Not on file  Occupational History    Comment: Neurosurgeon- self-employed  Tobacco Use   Smoking status: Never   Smokeless tobacco: Former    Types: Chew    Quit date: 2021  Vaping Use   Vaping Use: Never used  Substance and Sexual Activity   Alcohol use: Yes    Alcohol/week: 4.0 standard drinks of alcohol    Types: 4 Cans of beer per week    Comment: occasionally   Drug use: No   Sexual activity: Yes    Birth control/protection: None  Other Topics Concern   Not on file  Social History Narrative   Lives with his friend.    Social Determinants of Health   Financial Resource Strain: Not on file  Food Insecurity: Not on file  Transportation Needs: Not on file  Physical Activity: Not on file  Stress: Not on file  Social Connections: Not on file    Review of Systems  Respiratory: Negative.    Cardiovascular: Negative.    Objective:  BP 127/76   Pulse 82   Temp 97.9 F (36.6 C)   Ht 5\' 10"  (1.778 m)   Wt (!) 378 lb (171.5 kg)   SpO2 97%   BMI 54.24 kg/m      10/12/2022    3:08 PM 10/12/2022  2:31 PM 04/01/2022    3:23 PM  BP/Weight  Systolic BP 127 146 135  Diastolic BP 76 77 84  Wt. (Lbs)  378   BMI  54.24 kg/m2     Physical Exam Vitals and nursing note reviewed.  Constitutional:      Appearance: Normal appearance. He is obese.  HENT:     Head: Normocephalic and atraumatic.     Mouth/Throat:     Pharynx: Oropharynx is clear.  Cardiovascular:     Rate and Rhythm: Normal rate and regular rhythm.  Pulmonary:     Effort: Pulmonary effort is normal.     Breath sounds: Normal breath sounds. No wheezing, rhonchi or rales.  Skin:    Comments: Patient has 2 lesions on his face, 1 to left cheek and the other to the inferior aspect of right cheek.  Raised and rough.   Neurological:     Mental Status: He is alert.     Lab Results  Component Value Date   WBC 9.3 04/27/2022   HGB 13.1 04/27/2022   HCT 41.3 04/27/2022   PLT 257 04/27/2022   GLUCOSE 163 (H) 01/06/2022   CHOL 242 (H) 04/27/2022   TRIG 368 (H) 04/27/2022   HDL 37 (L) 04/27/2022   LDLCALC 138 (H) 04/27/2022   ALT 20 10/29/2021   AST 22 10/29/2021   NA 137 01/06/2022   K 4.3 01/06/2022   CL 106 01/06/2022   CREATININE 0.67 01/06/2022   BUN 12 01/06/2022   CO2 26 01/06/2022   TSH 2.520 04/27/2022   INR 2.0 09/30/2022   HGBA1C 8.6 (H) 04/27/2022   MICROALBUR 0.4 06/13/2020     Assessment & Plan:   Problem List Items Addressed This Visit       Cardiovascular and Mediastinum   HTN (hypertension)    Well-controlled.  Continue current medications.  Refilled today.      Relevant Medications   ezetimibe (ZETIA) 10 MG tablet   lisinopril (ZESTRIL) 20 MG tablet   metoprolol tartrate (LOPRESSOR) 25 MG tablet   CAD (coronary artery disease)    Has not seen cardiology for visit since 2021.  He has been going to his anticoagulation visits but has not seen a cardiologist.  CT scan coronaries revealed severe three-vessel disease.  Needs aggressive control of lipids, hypertension, and diabetes.      Relevant Medications   ezetimibe (ZETIA) 10 MG tablet   lisinopril (ZESTRIL) 20 MG tablet   metoprolol tartrate (LOPRESSOR) 25 MG tablet     Endocrine   Uncontrolled type 2 diabetes mellitus with hyperglycemia (HCC)    Unsure of control.  A1c today.  Metformin refilled.      Relevant Medications   lisinopril (ZESTRIL) 20 MG tablet   metFORMIN (GLUCOPHAGE) 500 MG tablet   Other Relevant Orders   CMP14+EGFR   Hemoglobin A1c   Microalbumin / creatinine urine ratio   Hyperlipidemia associated with type 2 diabetes mellitus (HCC)    Awaiting lipid panel.  Continue with Zetia.  Will likely need to add back statin at max dose      Relevant Medications   ezetimibe (ZETIA) 10 MG  tablet   lisinopril (ZESTRIL) 20 MG tablet   metoprolol tartrate (LOPRESSOR) 25 MG tablet   metFORMIN (GLUCOPHAGE) 500 MG tablet   Other Relevant Orders   Lipid panel     Musculoskeletal and Integument   Facial skin lesion    Concern for precancerous lesions.  Referring to dermatology.  Relevant Orders   Ambulatory referral to Dermatology     Other   Iron deficiency anemia   Relevant Orders   CBC   Iron, TIBC and Ferritin Panel   Encounter for screening colonoscopy    Needs colon cancer screening.  Referring to GI.      Relevant Orders   Ambulatory referral to Gastroenterology   Anxiety   Relevant Medications   sertraline (ZOLOFT) 50 MG tablet    Meds ordered this encounter  Medications   pantoprazole (PROTONIX) 40 MG tablet    Sig: Take 1 tablet (40 mg total) by mouth daily.    Dispense:  90 tablet    Refill:  3   sertraline (ZOLOFT) 50 MG tablet    Sig: Take 1 tablet (50 mg total) by mouth daily.    Dispense:  90 tablet    Refill:  3   ezetimibe (ZETIA) 10 MG tablet    Sig: Take 1 tablet (10 mg total) by mouth daily.    Dispense:  90 tablet    Refill:  3   gabapentin (NEURONTIN) 300 MG capsule    Sig: Take 2 capsules (600 mg total) by mouth 2 (two) times daily.    Dispense:  360 capsule    Refill:  3   lisinopril (ZESTRIL) 20 MG tablet    Sig: Take 1 tablet (20 mg total) by mouth daily.    Dispense:  90 tablet    Refill:  3   metoprolol tartrate (LOPRESSOR) 25 MG tablet    Sig: Take 1 tablet (25 mg total) by mouth 2 (two) times daily.    Dispense:  180 tablet    Refill:  3   metFORMIN (GLUCOPHAGE) 500 MG tablet    Sig: Take 2 tablets (1,000 mg total) by mouth 2 (two) times daily with a meal.    Dispense:  360 tablet    Refill:  3    Follow-up:  Return in about 3 months (around 01/11/2023).  [place]

## 2022-10-12 NOTE — Assessment & Plan Note (Signed)
Awaiting lipid panel.  Continue with Zetia.  Will likely need to add back statin at max dose

## 2022-10-12 NOTE — Assessment & Plan Note (Addendum)
Has not seen cardiology for visit since 2021.  He has been going to his anticoagulation visits but has not seen a cardiologist.  CT scan coronaries revealed severe three-vessel disease.  Needs aggressive control of lipids, hypertension, and diabetes.

## 2022-10-12 NOTE — Assessment & Plan Note (Signed)
Well-controlled.  Continue current medications.  Refilled today. 

## 2022-10-12 NOTE — Assessment & Plan Note (Signed)
Unsure of control.  A1c today.  Metformin refilled.

## 2022-10-28 ENCOUNTER — Ambulatory Visit: Payer: Medicaid Other | Attending: Cardiology | Admitting: *Deleted

## 2022-10-28 DIAGNOSIS — Z5181 Encounter for therapeutic drug level monitoring: Secondary | ICD-10-CM

## 2022-10-28 DIAGNOSIS — Z952 Presence of prosthetic heart valve: Secondary | ICD-10-CM

## 2022-10-28 LAB — POCT INR: INR: 2.5 (ref 2.0–3.0)

## 2022-10-28 NOTE — Patient Instructions (Signed)
Continue warfarin 1/2 tablet daily except 1 tablet on Sundays, Tuesdays and Thursdays Recheck in 4 wks

## 2022-11-02 ENCOUNTER — Ambulatory Visit (INDEPENDENT_AMBULATORY_CARE_PROVIDER_SITE_OTHER): Payer: Medicaid Other | Admitting: Gastroenterology

## 2022-11-02 ENCOUNTER — Telehealth: Payer: Self-pay | Admitting: *Deleted

## 2022-11-02 ENCOUNTER — Encounter: Payer: Self-pay | Admitting: Gastroenterology

## 2022-11-02 VITALS — BP 132/83 | HR 112 | Temp 97.3°F | Ht 70.0 in | Wt 380.5 lb

## 2022-11-02 DIAGNOSIS — K219 Gastro-esophageal reflux disease without esophagitis: Secondary | ICD-10-CM

## 2022-11-02 DIAGNOSIS — Z1211 Encounter for screening for malignant neoplasm of colon: Secondary | ICD-10-CM | POA: Diagnosis not present

## 2022-11-02 DIAGNOSIS — Z800 Family history of malignant neoplasm of digestive organs: Secondary | ICD-10-CM

## 2022-11-02 NOTE — Telephone Encounter (Signed)
   Name: Adam Long  DOB: [birth year]  MRN: [id number]  Primary Cardiologist: Rozann Lesches, MD  Chart reviewed as part of pre-operative protocol coverage. Because of Adam Long past medical history and time since last visit, he will require a follow-up in-office visit in order to better assess preoperative cardiovascular risk.  Pre-op covering staff: - Please schedule appointment and call patient to inform them. If patient already had an upcoming appointment within acceptable timeframe, please add "pre-op clearance" to the appointment notes so provider is aware. - Please contact requesting surgeon's office via preferred method (i.e, phone, fax) to inform them of need for appointment prior to surgery.  Lenna Sciara, NP  11/02/2022, 3:44 PM

## 2022-11-02 NOTE — Telephone Encounter (Signed)
Patient with diagnosis of aortic mechanical valve  on warfarin for anticoagulation.  There is no hx of previous thromboembolism, stroke, or other risk factors   Procedure: Colonoscopy Date of procedure: TBD  CrCl: >90 mL/min (SrCr 0.82 02/02/2022) Platelet count : 351K (02/02/2022)  Per office protocol, patient can hold warfarin for 5 days prior to procedure.   Patient will not need bridging with Lovenox (enoxaparin) around procedure.  **This guidance is not considered finalized until pre-operative APP has relayed final recommendations.**

## 2022-11-02 NOTE — Progress Notes (Addendum)
GI Office Note    Referring Provider: Coral Spikes, DO Primary Care Physician:  Coral Spikes, DO  Primary Gastroenterologist: Elon Alas. Abbey Chatters, DO  Chief Complaint   Chief Complaint  Patient presents with   New patient consult    Patient here today due to the need of a tcs. Patient has occasional gas,bloating and loose stools he says he takes Metform and thinks this causes a lot of his issues. Patient is on a blood thinner.    History of Present Illness   Adam Long is a 47 y.o. male presenting today at the request of Coral Spikes, DO for colonoscopy.   CAD and aortic stenosis with history of aortic valve replacement in 2021. Chornically on warfarin.   No prior colonoscopy on file.   Today: Works for [employer]. Had a left lower extremity amputation in December 2022 and just went back to work in November 2023. Doing more walking.  Still working with PT. Good control of blood sugars. No swelling in right extremity. No chest pain or shortness breath. On coumadin, no recent bleeding episodes. No melena or brbpr. No abdominal pain. Has occasionally diarrhea with metformin.  Has occasional looser stools and bloating likely secondary to metformin.  Uses Imodium on occasion.  GERD controlled on pantoprazole. No breakthrough symptoms. Has been on it since December 2022. Was taking this while in the hospital. No dysphagia, N/V. No unintentional weight loss.   Follows with [facility] clinic for warfarin.   Current Outpatient Medications  Medication Sig Dispense Refill   ezetimibe (ZETIA) 10 MG tablet Take 1 tablet (10 mg total) by mouth daily. 90 tablet 3   gabapentin (NEURONTIN) 300 MG capsule Take 2 capsules (600 mg total) by mouth 2 (two) times daily. 360 capsule 3   lisinopril (ZESTRIL) 20 MG tablet Take 1 tablet (20 mg total) by mouth daily. 90 tablet 3   metFORMIN (GLUCOPHAGE) 500 MG tablet Take 2 tablets (1,000 mg total) by mouth 2 (two) times daily with a meal. 360  tablet 3   metoprolol tartrate (LOPRESSOR) 25 MG tablet Take 1 tablet (25 mg total) by mouth 2 (two) times daily. 180 tablet 3   pantoprazole (PROTONIX) 40 MG tablet Take 1 tablet (40 mg total) by mouth daily. 90 tablet 3   sertraline (ZOLOFT) 50 MG tablet Take 1 tablet (50 mg total) by mouth daily. 90 tablet 3   warfarin (COUMADIN) 10 MG tablet Take 1 tablet daily except 1/2 tablet on Mondays, Wednesdays and Fridays or as directed 90 tablet 3   No current facility-administered medications for this visit.    Past Medical History:  Diagnosis Date   Anxiety    Aortic stenosis    Cellulitis and abscess of lower extremity 06/11/2019   Cellulitis of fourth toe of left foot    Cholelithiasis    Coronary artery disease    Nonobstructive CAD (40-50% LAD) 08/2019   Depression    Diabetes mellitus without complication ([place])    Phreesia 09/27/2020   Elevated troponin level not due myocardial infarction 11/11/2019   Essential hypertension    Gangrene of toe of left foot ([place]) 07/06/2019   Heart murmur    Phreesia 09/27/2020   Hyperlipidemia    Phreesia 09/27/2020   Hypertension    Phreesia 09/27/2020   Mixed hyperlipidemia    Morbid obesity ([place])    S/P aortic valve replacement with mechanical valve 12/05/2019   25 mm Carbomedics top hat bileaflet mechanical  valve via partial upper hemi-sternotomy   Severe aortic stenosis 09/24/2019   Type 2 diabetes mellitus [facility])     Past Surgical History:  Procedure Laterality Date   ABDOMINAL AORTOGRAM W/LOWER EXTREMITY N/A 07/06/2019   Procedure: ABDOMINAL AORTOGRAM W/LOWER EXTREMITY;  Surgeon: Sherren Kerns, MD;  Location: [facility] INVASIVE CV LAB;  Service: Cardiovascular;  Laterality: N/A;  Bilateral   AMPUTATION Left 07/09/2019   Procedure: LEFT FOURTH and Fifth TOE AMPUTATION.;  Surgeon: Larina Earthly, MD;  Location[facility] OR;  Service: Vascular;  Laterality: Left;   AMPUTATION Left 03/11/2021   Procedure: LEFT FOOT 5TH  AND 4TH RAY AMPUTATION;   Surgeon: Nadara Mustard, MD;  Location: [facility] OR;  Service: Orthopedics;  Laterality: Left;   AMPUTATION Left 08/28/2021   Procedure: AMPUTATION BELOW KNEE;  Surgeon: Nadara Mustard, MD;  Location: [facility] OR;  Service: Orthopedics;  Laterality: Left;   AORTIC VALVE REPLACEMENT N/A 12/05/2019   Procedure: PARTIAL STERNOTOMY FOR AORTIC VALVE REPLACEMENT (AVR), USING CARBOMEDICS SUPRA-ANNULAR TOP HAT ;  Surgeon: Purcell Nails, MD;  Location: [facility] OR;  Service: Open Heart Surgery;  Laterality: N/A;  No neck lines on left   APPLICATION OF WOUND VAC Left 10/30/2021   Procedure: APPLICATION OF WOUND VAC;  Surgeon: Nadara Mustard, MD;  Location: [facility] OR;  Service: Orthopedics;  Laterality: Left;   APPLICATION OF WOUND VAC Left 12/25/2021   Procedure: APPLICATION OF WOUND VAC;  Surgeon: Nadara Mustard, MD;  Location: [facility] OR;  Service: Orthopedics;  Laterality: Left;   CARDIAC VALVE REPLACEMENT N/A    Phreesia 09/27/2020   IR RADIOLOGY PERIPHERAL GUIDED IV START  10/05/2019   IR US GUIDE VASC ACCESS RIGHT  10/05/2019   MULTIPLE EXTRACTIONS WITH ALVEOLOPLASTY N/A 10/26/2019   Procedure: EXTRACTION OF TOOTH #'S 3, 5-11,19-28,  AND 32 WITH ALVEOLOPLASTY;  Surgeon: Charlynne Pander, DDS;  Location: [facility] OR;  Service: Oral Surgery;  Laterality: N/A;   RIGHT HEART CATH AND CORONARY ANGIOGRAPHY N/A 09/24/2019   Procedure: RIGHT HEART CATH AND CORONARY ANGIOGRAPHY;  Surgeon: Tonny Bollman, MD;  Location: [facility] INVASIVE CV LAB;  Service: Cardiovascular;  Laterality: N/A;   STUMP REVISION Left 10/30/2021   Procedure: REVISION LEFT BELOW KNEE AMPUTATION;  Surgeon: Nadara Mustard, MD;  Location: [facility] OR;  Service: Orthopedics;  Laterality: Left;   STUMP REVISION Left 12/25/2021   Procedure: REVISION LEFT BELOW KNEE AMPUTATION;  Surgeon: Nadara Mustard, MD;  Location: [facility] OR;  Service: Orthopedics;  Laterality: Left;   TEE WITHOUT CARDIOVERSION N/A 12/05/2019   Procedure: TRANSESOPHAGEAL ECHOCARDIOGRAM (TEE);  Surgeon: Purcell Nails,  MD;  Location: [facility] OR;  Service: Open Heart Surgery;  Laterality: N/A;   TEE WITHOUT CARDIOVERSION N/A 09/01/2021   Procedure: TRANSESOPHAGEAL ECHOCARDIOGRAM (TEE);  Surgeon: Little Ishikawa, MD;  Location: [facility] ENDOSCOPY;  Service: Cardiovascular;  Laterality: N/A;    Family History  Problem Relation Age of Onset   Cancer Mother        Brain tumor   Heart disease Father    Hyperlipidemia Father    Hypertension Father    Stroke Father    Heart murmur Sister    Heart attack Maternal Grandmother    Liver disease Maternal Grandfather    Breast cancer Paternal Grandmother    COPD Paternal Grandfather    Heart attack Maternal Aunt    Colon cancer Maternal Uncle 67   Pancreatic cancer Paternal Aunt    Heart disease Paternal Uncle    Prostate cancer  Neg Hx     Allergies as of 11/02/2022 - Review Complete 11/02/2022  Allergen Reaction Noted   Pollen extract Other (See Comments) 01/14/2016    Social History   Socioeconomic History   Marital status: Single    Spouse name: Not on file   Number of children: Not on file   Years of education: Not on file   Highest education level: Not on file  Occupational History    Comment: Neurosurgeon- self-employed  Tobacco Use   Smoking status: Never   Smokeless tobacco: Former    Types: Chew    Quit date: 2021  Vaping Use   Vaping Use: Never used  Substance and Sexual Activity   Alcohol use: Yes    Alcohol/week: 4.0 standard drinks of alcohol    Types: 4 Cans of beer per week    Comment: occasionally   Drug use: No   Sexual activity: Yes    Birth control/protection: None  Other Topics Concern   Not on file  Social History Narrative   Lives with his friend.    Social Determinants of Health   Financial Resource Strain: Not on file  Food Insecurity: Not on file  Transportation Needs: Not on file  Physical Activity: Not on file  Stress: Not on file  Social Connections: Not on file  Intimate Partner Violence: Not on  file     Review of Systems   Gen: Denies any fever, chills, fatigue, weight loss, lack of appetite.  CV: Denies chest pain, heart palpitations, peripheral edema, syncope.  Resp: Denies shortness of breath at rest or with exertion. Denies wheezing or cough.  GI: see HPI GU : Denies urinary burning, urinary frequency, urinary hesitancy MS: Denies joint pain, muscle weakness, cramps, or limitation of movement.  Derm: Denies rash, itching, dry skin Psych: Denies depression, anxiety, memory loss, and confusion Heme: Denies bruising, bleeding, and enlarged lymph nodes.   Physical Exam   BP 132/83 (BP Location: Left Arm, Patient Position: Sitting, Cuff Size: Large)   Pulse (!) 112   Temp (!) 97.3 F (36.3 C) (Temporal)   Ht 5\' 10"  (1.778 m)   Wt (!) 380 lb 8 oz (172.6 kg)   BMI 54.60 kg/m   General:   Alert and oriented. Pleasant and cooperative. Well-nourished and well-developed.  Head:  Normocephalic and atraumatic. Eyes:  Without icterus, sclera clear and conjunctiva pink.  Ears:  Normal auditory acuity. Mouth:  No deformity or lesions, oral mucosa pink.  Lungs:  Clear to auscultation bilaterally. No wheezes, rales, or rhonchi. No distress.  Heart:  S1, S2 present. Systolic click present.  Abdomen: Obese, +BS, soft, non-tender and non-distended. No HSM noted. No guarding or rebound. No masses appreciated.  Rectal:  Deferred  Msk,Extremities: Left lower extremity BKA, prosthetic in place. Neurologic:  Alert and  oriented x4;  grossly normal neurologically. Skin:  Intact without significant lesions or rashes. Psych:  Alert and cooperative. Normal mood and affect.   Assessment   Adam Long is a 47 y.o. male with a history of CAD and aortic stenosis s/p aortic valve replacement in 2021 (chronically on warfarin), diabetes, HTN, HLD, diabetes, anxiety and depression presenting today for evaluation prior to scheduling screening colonoscopy  Screening for colon cancer, family  history of colon cancer: Family history of colon cancer in his uncle in his 38s.  Also with notable family history of pancreatic cancer.  No prior colonoscopy on file.  Does have some occasional looser stools and  bloating likely secondary to metformin, does admit to occasional Imodium use.  No alarm symptoms present, no upper or lower GI symptoms.  Will proceed with first-ever screening colonoscopy, patient agreeable to proceed.  We discussed getting clearance from cardiology regarding his previous aortic valve replacement and to hold Coumadin for 5 days versus bridging with Lovenox.  We also discussed other medication adjustments.  GERD: Started on pantoprazole after hospitalization in December 2022 for his amputation.  He is continued on this medication and has had no breakthrough symptoms.  May discuss weaning in the future.  Denies any nausea, vomiting, upper abdominal pain, dysphagia.  PLAN   Proceed with colonoscopy with propofol by Dr. Abbey Chatters in near future: the risks, benefits, and alternatives have been discussed with the patient in detail. The patient states understanding and desires to proceed. ASA 3/4 Cardiac Clearance to hold coumadin for 5 days or bridge with Lovenox.  Hold metformin night prior to and morning of procedure. Continue pantoprazole 40 mg once daily, may consider weaning in the future. Follow-up in 6 months, sooner if needed.   Venetia Night, MSN, FNP-BC, AGACNP-BC [facility]

## 2022-11-02 NOTE — Patient Instructions (Signed)
We are scheduling you for a colonoscopy in the near future with Dr. Abbey Chatters.  Our scheduler will be in touch with you once we receive clearance from cardiology to hold your Coumadin for 5 days or for them to provide [place] assistance with bridging with Lovenox.  In summary you will also need to hold your metformin the night prior to and the morning of her procedure, these will be outlined in detailed fashion for you on your prep instructions.  You may continue pantoprazole 40 mg once daily.  We should consider weaning this in the future.  It was a pleasure meeting you today, we will plan to follow-up in 6 months, sooner if needed.  At that time we may discuss weaning your pantoprazole.  It was a pleasure to see you today. I want to create trusting relationships with patients. If you receive a survey regarding your visit,  I greatly appreciate you taking time to fill this out on paper or through your MyChart. I value your feedback.  Venetia Night, MSN, FNP-BC, AGACNP-BC [facility]

## 2022-11-02 NOTE — Telephone Encounter (Signed)
Routed to PharmD Pre-op Pool

## 2022-11-02 NOTE — Telephone Encounter (Signed)
Follow up appointment scheduled with Ermalinda Barrios, PA. Patient agreeable and voiced understanding.

## 2022-11-02 NOTE — Telephone Encounter (Signed)
  Request for patient to stop medication prior to procedure or is needing cleareance  11/02/22  Adam Long 04-03-1976  What type of surgery is being performed? colonoscopy  When is surgery scheduled? TBD  What type of clearance is required (medical or pharmacy to hold medication or both? Medication  Are there any medications that need to be held prior to surgery and how long? Coumadin x 5 days and does he need lovenox bridge?  Name of physician performing surgery?  Dr. Arvilla Meres Gastroenterology at [facility] Phone: [phone] Fax: [phone]  Anethesia type (none, local, MAC, general)? MAC

## 2022-11-02 NOTE — Telephone Encounter (Signed)
Cardiac Clearance    11/02/22  [name] 1975/11/17  What type of surgery is being performed? Colonoscopy  When is surgery scheduled? TBD  What type of clearance is required (medical or pharmacy to hold medication or both? Cardiac clearance  Name of physician performing surgery?  Dr. Arvilla Meres [facility]astroenterology at [facility] Phone: [phone] Fax: [phone]  Anethesia type (none, local, MAC, general)? MAC

## 2022-11-02 NOTE — Telephone Encounter (Signed)
   Patient Name: Adam Long  DOB: 11-14-1975 MRN: [id number]  Primary Cardiologist: Rozann Lesches, MD  Clinical pharmacists have reviewed the patient's past medical history, labs, and current medications as part of preoperative protocol coverage. The following recommendations have been made:   Patient with diagnosis of aortic mechanical valve  on warfarin for anticoagulation.  There is no hx of previous thromboembolism, stroke, or other risk factors    Procedure: Colonoscopy Date of procedure: TBD   CrCl: >90 mL/min (SrCr 0.82 02/02/2022) Platelet count : 351K (02/02/2022)   Per office protocol, patient can hold warfarin for 5 days prior to procedure.   Patient will not need bridging with Lovenox (enoxaparin) around procedure.  I will route this recommendation to the requesting party via Epic fax function and remove from pre-op pool.  Please call with questions.  Lenna Sciara, NP 11/02/2022, 4:22 PM

## 2022-11-03 ENCOUNTER — Encounter: Payer: Self-pay | Admitting: Gastroenterology

## 2022-11-08 NOTE — Progress Notes (Unsigned)
Cardiology Office Note:    Date:  11/09/2022   ID:  Adam Long, DOB June 24, 1976, MRN [id number]  PCP:  Coral Spikes, DO  [place] Prov[facility] Cardiologist:  Rozann Lesches, MD     Referring MD: Coral Spikes, DO   Chief Complaint:  Pre-op Exam     History of Present Illness:   Adam Long is a 47 y.o. male with history of nonobstructive CAD, HTN , severe AS s/p mechanical AVR 11/2019 on Coumadin. Had a TEE 08/31/21 for bacteremia.  Patient saw Dr. Domenic Polite 08/2020 and was doing well.  Patient here for preop clearance for colonoscopy Dr. Abbey Chatters. Pharmacy team cleared him to hold coumadin for 5 days without a lovenox bridge. Denies chest pain, dyspnea, edema, palpitations. BP up today but usually 120/80. Occasionally 130/80. Had fast food for lunch today and just took his meds. It came down on recheck. Since here last he had LBKA for infection and now walks with a prosthetic. Has gained about 15 lbs. Still getting used to prosthesis.      Past Medical History:  Diagnosis Date   Anxiety    Aortic stenosis    Cellulitis and abscess of lower extremity 06/11/2019   Cellulitis of fourth toe of left foot    Cholelithiasis    Coronary artery disease    Nonobstructive CAD (40-50% LAD) 08/2019   Depression    Diabetes mellitus without complication ([place])    Phreesia 09/27/2020   Elevated troponin level not due myocardial infarction 11/11/2019   Essential hypertension    Gangrene of toe of left foot ([place]) 07/06/2019   Heart murmur    Phreesia 09/27/2020   Hyperlipidemia    Phreesia 09/27/2020   Hypertension    Phreesia 09/27/2020   Mixed hyperlipidemia    Morbid obesity ([place])    S/P aortic valve replacement with mechanical valve 12/05/2019   25 mm Carbomedics top hat bileaflet mechanical valve via partial upper hemi-sternotomy   Severe aortic stenosis 09/24/2019   Type 2 diabetes mellitus ([facility])    Current Medications: Current Meds  Medication Sig    ezetimibe (ZETIA) 10 MG tablet Take 1 tablet (10 mg total) by mouth daily.   gabapentin (NEURONTIN) 300 MG capsule Take 2 capsules (600 mg total) by mouth 2 (two) times daily.   lisinopril (ZESTRIL) 20 MG tablet Take 1 tablet (20 mg total) by mouth daily.   metFORMIN (GLUCOPHAGE) 500 MG tablet Take 2 tablets (1,000 mg total) by mouth 2 (two) times daily with a meal.   metoprolol tartrate (LOPRESSOR) 25 MG tablet Take 1 tablet (25 mg total) by mouth 2 (two) times daily.   pantoprazole (PROTONIX) 40 MG tablet Take 1 tablet (40 mg total) by mouth daily.   sertraline (ZOLOFT) 50 MG tablet Take 1 tablet (50 mg total) by mouth daily.   warfarin (COUMADIN) 10 MG tablet Take 1 tablet daily except 1/2 tablet on Mondays, Wednesdays and Fridays or as directed    Allergies:   Pollen extract   Social History   Tobacco Use   Smoking status: Never   Smokeless tobacco: Former    Types: Chew    Quit date: 2021  Vaping Use   Vaping Use: Never used  Substance Use Topics   Alcohol use: Yes    Alcohol/week: 4.0 standard drinks of alcohol    Types: 4 Cans of beer per week    Comment: occasionally   Drug use: No    Family Hx: The patient's  family history includes Breast cancer in his paternal grandmother; COPD in his paternal grandfather; Cancer in his mother; Colon cancer (age of onset: 56) in his maternal uncle; Heart attack in his maternal aunt and maternal grandmother; Heart disease in his father and paternal uncle; Heart murmur in his sister; Hyperlipidemia in his father; Hypertension in his father; Liver disease in his maternal grandfather; Pancreatic cancer in his paternal aunt; Stroke in his father. There is no history of Prostate cancer.  ROS     Physical Exam:    VS:  BP 120/60   Pulse 90   Ht 5' 10"$  (1.778 m)   Wt (!) 382 lb (173.3 kg)   SpO2 98%   BMI 54.81 kg/m     Wt Readings from Last 3 Encounters:  11/09/22 (!) 382 lb (173.3 kg)  11/02/22 (!) 380 lb 8 oz (172.6 kg)  10/12/22  (!) 378 lb (171.5 kg)    Physical Exam  GEN: Obese, in no acute distress  Neck: no JVD, carotid bruits, or masses Cardiac:RRR; crisp valve clicks, no murmurs, rubs, or gallops  Respiratory:  clear to auscultation bilaterally, normal work of breathing GI: soft, nontender, nondistended, + BS Ext: lymphedema on right leg, LBKA Neuro:  Alert and Oriented x 3,  Psych: euthymic mood, full affect        EKGs/Labs/Other Test Reviewed:    EKG:  EKG is   ordered today.  The ekg ordered today demonstrates NSR with IRBBB similar to prior tracings  Recent Labs: 01/06/2022: BUN 12; Creatinine, Ser 0.67; Potassium 4.3; Sodium 137 04/27/2022: Hemoglobin 13.1; Platelets 257; TSH 2.520   Recent Lipid Panel Recent Labs    04/27/22 1033  CHOL 242*  TRIG 368*  HDL 37*  LDLCALC 138*     Prior CV Studies:   TEE 08/2021 IMPRESSIONS     1. Left ventricular ejection fraction, by estimation, is 60 to 65%. The  left ventricle has normal function. The left ventricle has no regional  wall motion abnormalities.   2. Right ventricular systolic function is normal. The right ventricular  size is mildly enlarged.   3. No left atrial/left atrial appendage thrombus was detected.   4. The mitral valve is normal in structure. Trivial mitral valve  regurgitation.   5. There is a 25 mm bileaflet valve present in the aortic position      Aortic valve regurgitation is not visualized. Echo findings are  consistent with normal structure and function of the aortic valve  prosthesis. Aortic valve mean gradient measures 13.0 mmHg.   Conclusion(s)/Recommendation(s): No vegetation seen. Pulmonic valve poorly  visualized due to shadowing from mechanical aortic valve. No clear  vegetation on mechanical aortic valve. Would monitor serial echocardiogram  while treating bacteremia if clinical   suspicion for endocarditis.     Risk Assessment/Calculations/Metrics:              ASSESSMENT & PLAN:   No  problem-specific Assessment & Plan notes found for this encounter.   Preop clearance for colonoscopy by Dr. Abbey Chatters. He has mechanical AVR. He was cleared by preop team and pharmacists to hold coumadin for 5 days prior to colonoscopy without a lovenox bridge. Patient with LBKA and new prosthesis so not too active but MET's still >5.  He can proceed with colonoscopy without further cardiac testing.   According to the Revised Cardiac Risk Index (RCRI), his Perioperative Risk of Major Cardiac Event is (%): 0.9  His Functional Capacity in METs is: 5.07 according  to the [employer] Activity Status Index (DASI).    History of severe aortic stenosis status post mechanical AVR in 11/2019.  He continues to do well, remains on Coumadin with follow-up in the anticoagulation clinic. TEE 09/01/21 stable   Nonobstructive CAD, asymptomatic.  Off aspirin since he's on Coumadin.  Otherwise continue Lipitor, last LDL 79.   Essential hypertension, blood pressure is well controlled today.  Continue lisinopril and Lopressor.   HLD managed by PCP due for blood work tomorrow. On Zetia. Goal LDL <55 being a diabetic.  Morbid Obesity-mediterranean diet and weight loss discussed.     Dispo:  No follow-ups on file.   Medication Adjustments/Labs and Tests Ordered: Current medicines are reviewed at length with the patient today.  Concerns regarding medicines are outlined above.  Tests Ordered: Orders Placed This Encounter  Procedures   EKG 12-Lead   Medication Changes: No orders of the defined types were placed in this encounter.  Adam Boast, PA-C  11/09/2022 2:38 PM    [address], [place], [place]  [address] Phone: [phone]; Fax: [phone]

## 2022-11-09 ENCOUNTER — Ambulatory Visit: Payer: Medicaid Other | Attending: Physician Assistant | Admitting: Physician Assistant

## 2022-11-09 ENCOUNTER — Encounter: Payer: Self-pay | Admitting: Physician Assistant

## 2022-11-09 VITALS — BP 120/60 | HR 90 | Ht 70.0 in | Wt 382.0 lb

## 2022-11-09 DIAGNOSIS — I2510 Atherosclerotic heart disease of native coronary artery without angina pectoris: Secondary | ICD-10-CM | POA: Diagnosis not present

## 2022-11-09 DIAGNOSIS — I10 Essential (primary) hypertension: Secondary | ICD-10-CM

## 2022-11-09 DIAGNOSIS — Z01818 Encounter for other preprocedural examination: Secondary | ICD-10-CM | POA: Diagnosis not present

## 2022-11-09 DIAGNOSIS — E782 Mixed hyperlipidemia: Secondary | ICD-10-CM

## 2022-11-09 NOTE — Patient Instructions (Addendum)
Medication Instructions:  Your physician recommends that you continue on your current medications as directed. Please refer to the Current Medication list given to you today.   Labwork: None today  Testing/Procedures: None today  Follow-Up: 1 year Dr.McDowell  Any Other Special Instructions Will Be Listed Below (If Applicable).  Follow the Mediterranean diet  If you need a refill on your cardiac medications before your next appointment, please call your pharmacy.

## 2022-11-10 ENCOUNTER — Encounter: Payer: Self-pay | Admitting: *Deleted

## 2022-11-10 MED ORDER — PEG 3350-KCL-NA BICARB-NACL 420 G PO SOLR: 4000.0000 mL | Freq: Once | ORAL | 0 refills | Status: AC

## 2022-11-10 NOTE — Telephone Encounter (Signed)
CALLED PT. Scheduled for 3/11 at 930am. Aware will send instructions/pre-op appt. Rx for prep to be sent to pharmacy

## 2022-11-10 NOTE — Addendum Note (Signed)
Addended by: Cheron Every on: 11/10/2022 03:27 PM   Modules accepted: Orders

## 2022-11-17 ENCOUNTER — Ambulatory Visit (INDEPENDENT_AMBULATORY_CARE_PROVIDER_SITE_OTHER): Payer: Medicaid Other | Admitting: Family

## 2022-11-17 ENCOUNTER — Encounter: Payer: Self-pay | Admitting: Family

## 2022-11-17 DIAGNOSIS — Z89512 Acquired absence of left leg below knee: Secondary | ICD-10-CM

## 2022-11-17 NOTE — Progress Notes (Signed)
Office Visit Note   Patient: Adam Long           Date of Birth: 12/02/1975           MRN: [id number] Visit Date: 11/17/2022              Requested by: Coral Spikes, DO [place],  [place] [id number] PCP: Coral Spikes, DO  Chief Complaint  Patient presents with   Left Leg - Follow-up    Hx left BKA       HPI: The patient is a 48 year old gentleman seen today for evaluation of his left residual limb he is status post below-knee amputation July of last year.  His current socket is ill fitting and loose he is wearing 16 layers of ply and still has a loose fit the socket is outturned today as he walks in the hall he is currently in his initial socket  Patient is an existing left transtibial  amputee.  Patient's current comorbidities are not expected to impact the ability to function with the prescribed prosthesis. Patient verbally communicates a strong desire to use a prosthesis. Patient currently requires mobility aids to ambulate without a prosthesis.  Expects not to use mobility aids with a new prosthesis.  Patient is a K2 level ambulator that will use a prosthesis to walk around their home and the community over low level environmental barriers.      Assessment & Plan: Visit Diagnoses: No diagnosis found.  Plan: Given an order for new socket set up he will proceed with this.  Follow-up as needed.  Follow-Up Instructions: No follow-ups on file.   Ortho Exam  Patient is alert, oriented, no adenopathy, well-dressed, normal affect, normal respiratory effort. On examination of the left residual limb his amputation is well consolidated well-healed there is no callus no impending skin breakdown  Imaging: No results found. No images are attached to the encounter.  Labs: Lab Results  Component Value Date   HGBA1C 8.6 (H) 04/27/2022   HGBA1C 6.4 (H) 10/30/2021   HGBA1C 7.8 (H) 08/28/2021   ESRSEDRATE 60 (H) 09/14/2021   ESRSEDRATE 65 (H) 09/07/2021    ESRSEDRATE 104 (H) 08/29/2021   CRP 1.5 (H) 09/14/2021   CRP 3.4 (H) 09/07/2021   CRP 12.8 (H) 08/29/2021   REPTSTATUS 12/28/2021 FINAL 12/25/2021   GRAMSTAIN  12/25/2021    FEW WBC PRESENT,BOTH PMN AND MONONUCLEAR MODERATE GRAM POSITIVE COCCI FEW GRAM NEGATIVE RODS FEW HYPHAE, SEGMENTED Performed at [facility], [place] [address] [place],  [address]    CULT  12/25/2021    ABUNDANT KLEBSIELLA PNEUMONIAE Confirmed Extended Spectrum Beta-Lactamase Producer (ESBL).  In bloodstream infections from ESBL organisms, carbapenems are preferred over piperacillin/tazobactam. They are shown to have a lower risk of mortality. ABUNDANT CLOSTRIDIUM PERFRINGENS ABUNDANT ENTEROCOCCUS FAECALIS    LABORGA KLEBSIELLA PNEUMONIAE 12/25/2021   LABORGA ENTEROCOCCUS FAECALIS 12/25/2021     Lab Results  Component Value Date   ALBUMIN 3.6 10/29/2021   ALBUMIN 2.6 (L) 09/14/2021   ALBUMIN 2.3 (L) 09/03/2021   PREALBUMIN 21.6 10/29/2021    Lab Results  Component Value Date   MG 1.5 (L) 10/29/2021   MG 1.8 02/13/2021   MG 2.1 12/06/2019   Lab Results  Component Value Date   VD25OH 15.4 (L) 04/27/2022   VD25OH 38 06/13/2020    Lab Results  Component Value Date   PREALBUMIN 21.6 10/29/2021      Latest Ref Rng & Units  04/27/2022   10:33 AM 01/06/2022   12:23 PM 12/27/2021    2:08 AM  CBC EXTENDED  WBC 3.4 - 10.8 x10E3/uL 9.3  10.4  11.3   RBC 4.14 - 5.80 x10E6/uL 5.26  3.26    3.14  3.31   Hemoglobin 13.0 - 17.7 g/dL 13.1  7.1  7.7   HCT 37.5 - 51.0 % 41.3  24.8  24.8   Platelets 150 - 450 x10E3/uL 257  342  230   NEUT# 1.7 - 7.7 K/uL  6.7    Lymph# 0.7 - 4.0 K/uL  2.0       There is no height or weight on file to calculate BMI.  Orders:  No orders of the defined types were placed in this encounter.  No orders of the defined types were placed in this encounter.    Procedures: No procedures performed  Clinical Data: No additional findings.  ROS:  All other  systems negative, except as noted in the HPI. Review of Systems  Objective: Vital Signs: There were no vitals taken for this visit.  Specialty Comments:  No specialty comments available.  PMFS History: Patient Active Problem List   Diagnosis Date Noted   History of osteomyelitis 10/12/2022   Uncontrolled type 2 diabetes mellitus with hyperglycemia ([place]) 10/12/2022   CAD (coronary artery disease) 10/12/2022   Facial skin lesion 10/12/2022   Encounter for screening colonoscopy 10/12/2022   Iron deficiency anemia 04/01/2022   S/P BKA (below knee amputation), left ([place]) 10/30/2021   Anxiety 09/30/2020   Preventative health care 12/13/2019   S/P aortic valve replacement with mechanical valve 12/05/2019   Hyperlipidemia associated with type 2 diabetes mellitus ([place]) 10/12/2015   Morbid obesity ([place]) 10/12/2015   HTN (hypertension) 07/02/2013   Past Medical History:  Diagnosis Date   Anxiety    Aortic stenosis    Cellulitis and abscess of lower extremity 06/11/2019   Cellulitis of fourth toe of left foot    Cholelithiasis    Coronary artery disease    Nonobstructive CAD (40-50% LAD) 08/2019   Depression    Diabetes mellitus without complication ([place])    Phreesia 09/27/2020   Elevated troponin level not due myocardial infarction 11/11/2019   Essential hypertension    Gangrene of toe of left foot ([place]) 07/06/2019   Heart murmur    Phreesia 09/27/2020   Hyperlipidemia    Phreesia 09/27/2020   Hypertension    Phreesia 09/27/2020   Mixed hyperlipidemia    Morbid obesity ([facility])    S/P aortic valve replacement with mechanical valve 12/05/2019   25 mm Carbomedics top hat bileaflet mechanical valve via partial upper hemi-sternotomy   Severe aortic stenosis 09/24/2019   Type 2 diabetes mellitus ([place])     Family History  Problem Relation Age of Onset   Cancer Mother        Brain tumor   Heart disease Father    Hyperlipidemia Father    Hypertension Father    Stroke Father     Heart murmur Sister    Heart attack Maternal Grandmother    Liver disease Maternal Grandfather    Breast cancer Paternal Grandmother    COPD Paternal Grandfather    Heart attack Maternal Aunt    Colon cancer Maternal Uncle 67   Pancreatic cancer Paternal Aunt    Heart disease Paternal Uncle    Prostate cancer Neg Hx     Past Surgical History:  Procedure Laterality Date   ABDOMINAL AORTOGRAM W/LOWER EXTREMITY N/A  07/06/2019   Procedure: ABDOMINAL AORTOGRAM W/LOWER EXTREMITY;  Surgeon: Elam Dutch, MD;  Location: [facility];  Service: Cardiovascular;  Laterality: N/A;  Bilateral   AMPUTATION Left 07/09/2019   Procedure: LEFT FOURTH and Fifth TOE AMPUTATION.;  Surgeon: Rosetta Posner, MD;  Location: [facility];  Service: Vascular;  Laterality: Left;   AMPUTATION Left 03/11/2021   Procedure: LEFT FOOT 5TH  AND 4TH RAY AMPUTATION;  Surgeon: Newt Minion, MD;  Location: [facility];  Service: Orthopedics;  Laterality: Left;   AMPUTATION Left 08/28/2021   Procedure: AMPUTATION BELOW KNEE;  Surgeon: Newt Minion, MD;  Location: [facility];  Service: Orthopedics;  Laterality: Left;   AORTIC VALVE REPLACEMENT N/A 12/05/2019   Procedure: PARTIAL STERNOTOMY FOR AORTIC VALVE REPLACEMENT (AVR), USING CARBOMEDICS SUPRA-ANNULAR TOP HAT 25MM;  Surgeon: Rexene Alberts, MD;  Location: [facility];  Service: Open Heart Surgery;  Laterality: N/A;  No neck lines on left   APPLICATION OF WOUND VAC Left 10/30/2021   Procedure: APPLICATION OF WOUND VAC;  Surgeon: Newt Minion, MD;  Location: [facility];  Service: Orthopedics;  Laterality: Left;   APPLICATION OF WOUND VAC Left 12/25/2021   Procedure: APPLICATION OF WOUND VAC;  Surgeon: Newt Minion, MD;  Location: [facility];  Service: Orthopedics;  Laterality: Left;   CARDIAC VALVE REPLACEMENT N/A    Phreesia 09/27/2020   IR RADIOLOGY PERIPHERAL GUIDED IV START  10/05/2019   IR US GUIDE VASC ACCESS RIGHT  10/05/2019   MULTIPLE EXTRACTIONS WITH ALVEOLOPLASTY N/A 10/26/2019    Procedure: EXTRACTION OF TOOTH #'S 3, 5-11,19-28,  AND 32 WITH ALVEOLOPLASTY;  Surgeon: Lenn Cal, DDS;  Location: [facility];  Service: Oral Surgery;  Laterality: N/A;   RIGHT HEART CATH AND CORONARY ANGIOGRAPHY N/A 09/24/2019   Procedure: RIGHT HEART CATH AND CORONARY ANGIOGRAPHY;  Surgeon: Sherren Mocha, MD;  Location: [facility];  Service: Cardiovascular;  Laterality: N/A;   STUMP REVISION Left 10/30/2021   Procedure: REVISION LEFT BELOW KNEE AMPUTATION;  Surgeon: Newt Minion, MD;  Location: [facility];  Service: Orthopedics;  Laterality: Left;   STUMP REVISION Left 12/25/2021   Procedure: REVISION LEFT BELOW KNEE AMPUTATION;  Surgeon: Newt Minion, MD;  Location: [facility];  Service: Orthopedics;  Laterality: Left;   TEE WITHOUT CARDIOVERSION N/A 12/05/2019   Procedure: TRANSESOPHAGEAL ECHOCARDIOGRAM (TEE);  Surgeon: Rexene Alberts, MD;  Location: [facility];  Service: Open Heart Surgery;  Laterality: N/A;   TEE WITHOUT CARDIOVERSION N/A 09/01/2021   Procedure: TRANSESOPHAGEAL ECHOCARDIOGRAM (TEE);  Surgeon: Donato Heinz, MD;  [facility];  Service: Cardiovascular;  Laterality: N/A;   Social History   Occupational History    Comment: Neurosurgeon- self-employed  Tobacco Use   Smoking status: Never   Smokeless tobacco: Former    Types: Chew    Quit date: 2021  Vaping Use   Vaping Use: Never used  Substance and Sexual Activity   Alcohol use: Yes    Alcohol/week: 4.0 standard drinks of alcohol    Types: 4 Cans of beer per week    Comment: occasionally   Drug use: No   Sexual activity: Yes    Birth control/protection: None

## 2022-11-25 ENCOUNTER — Ambulatory Visit: Payer: Medicaid Other | Attending: Cardiology | Admitting: *Deleted

## 2022-11-25 DIAGNOSIS — Z5181 Encounter for therapeutic drug level monitoring: Secondary | ICD-10-CM | POA: Diagnosis not present

## 2022-11-25 DIAGNOSIS — Z952 Presence of prosthetic heart valve: Secondary | ICD-10-CM

## 2022-11-25 LAB — POCT INR: POC INR: 2.2

## 2022-11-25 NOTE — Patient Instructions (Addendum)
Description   Hold warfarin 3/6-3/10  On 3/11-  Resume warfarin in the evening or as directed by doctor (take an extra half tablet with usual dose the night of the procedure then resume normal dose). Normal dose- warfarin 1 tablet daily expcet for 1/2 a tablet  on Monday, Wednesday and Friday.   Recheck INR 1 week post procedure.

## 2022-11-30 NOTE — Patient Instructions (Addendum)
Adam Long  11/30/2022     '@PREFPERIOPPHARMACY'$ @   Your procedure is scheduled on  12/06/2022.   Report to [facility] at  0730  A.M.   Call this number if you have problems the morning of surgery:  [phone]  If you experience any cold or flu symptoms such as cough, fever, chills, shortness of breath, etc. between now and your scheduled surgery, please notify us at the above number.   Remember:  Follow the diet and prep instructions given to you by the office.     Your last dose of coumadin should be on 11/27/2022.       DO NOT take any medications for diabetes the morning of your procedure.    Take these medicines the morning of surgery with A SIP OF WATER         gabapentin, metoprolol, pantoprazole, zoloft.     Do not wear jewelry, make-up or nail polish.  Do not wear lotions, powders, or perfumes, or deodorant.  Do not shave 48 hours prior to surgery.  Men may shave face and neck.  Do not bring valuables to the hospital.  [facility] is not responsible for any belongings or valuables.  Contacts, dentures or bridgework may not be worn into surgery.  Leave your suitcase in the car.  After surgery it may be brought to your room.  For patients admitted to the hospital, discharge time will be determined by your treatment team.  Patients discharged the day of surgery will not be allowed to drive home and must have someone with them for 24 hours.    Special instructions:   DO NOT smoke tobacco or vape for 24 hours before your procedure.  Please read over the following fact sheets that you were given. Anesthesia Post-op Instructions and Care and Recovery After Surgery       Colonoscopy, Adult, Care After The following information offers guidance on how to care for yourself after your procedure. Your health care provider may also give you more specific instructions. If you have problems or questions, contact your health care provider. What can I expect  after the procedure? After the procedure, it is common to have: A small amount of blood in your stool for 24 hours after the procedure. Some gas. Mild cramping or bloating of your abdomen. Follow these instructions at home: Eating and drinking  Drink enough fluid to keep your urine pale yellow. Follow instructions from your health care provider about eating or drinking restrictions. Resume your normal diet as told by your health care provider. Avoid heavy or fried foods that are hard to digest. Activity Rest as told by your health care provider. Avoid sitting for a long time without moving. Get up to take short walks every 1-2 hours. This is important to improve blood flow and breathing. Ask for help if you feel weak or unsteady. Return to your normal activities as told by your health care provider. Ask your health care provider what activities are safe for you. Managing cramping and bloating  Try walking around when you have cramps or feel bloated. If directed, apply heat to your abdomen as told by your health care provider. Use the heat source that your health care provider recommends, such as a moist heat pack or a heating pad. Place a towel between your skin and the heat source. Leave the heat on for 20-30 minutes. Remove the heat if your skin turns bright red. This is  especially important if you are unable to feel pain, heat, or cold. You have a greater risk of getting burned. General instructions If you were given a sedative during the procedure, it can affect you for several hours. Do not drive or operate machinery until your health care provider says that it is safe. For the first 24 hours after the procedure: Do not sign important documents. Do not drink alcohol. Do your regular daily activities at a slower pace than normal. Eat soft foods that are easy to digest. Take over-the-counter and prescription medicines only as told by your health care provider. Keep all follow-up  visits. This is important. Contact a health care provider if: You have blood in your stool 2-3 days after the procedure. Get help right away if: You have more than a small spotting of blood in your stool. You have large blood clots in your stool. You have swelling of your abdomen. You have nausea or vomiting. You have a fever. You have increasing pain in your abdomen that is not relieved with medicine. These symptoms may be an emergency. Get help right away. Call 911. Do not wait to see if the symptoms will go away. Do not drive yourself to the hospital. Summary After the procedure, it is common to have a small amount of blood in your stool. You may also have mild cramping and bloating of your abdomen. If you were given a sedative during the procedure, it can affect you for several hours. Do not drive or operate machinery until your health care provider says that it is safe. Get help right away if you have a lot of blood in your stool, nausea or vomiting, a fever, or increased pain in your abdomen. This information is not intended to replace advice given to you by your health care provider. Make sure you discuss any questions you have with your health care provider. Document Revised: 05/06/2021 Document Reviewed: 05/06/2021 Elsevier Patient Education  [place] After The following information offers guidance on how to care for yourself after your procedure. Your health care provider may also give you more specific instructions. If you have problems or questions, contact your health care provider. What can I expect after the procedure? After the procedure, it is common to have: Tiredness. Little or no memory about what happened during or after the procedure. Impaired judgment when it comes to making decisions. Nausea or vomiting. Some trouble with balance. Follow these instructions at home: For the time period you were told by your health care  provider:  Rest. Do not participate in activities where you could fall or become injured. Do not drive or use machinery. Do not drink alcohol. Do not take sleeping pills or medicines that cause drowsiness. Do not make important decisions or sign legal documents. Do not take care of children on your own. Medicines Take over-the-counter and prescription medicines only as told by your health care provider. If you were prescribed antibiotics, take them as told by your health care provider. Do not stop using the antibiotic even if you start to feel better. Eating and drinking Follow instructions from your health care provider about what you may eat and drink. Drink enough fluid to keep your urine pale yellow. If you vomit: Drink clear fluids slowly and in small amounts as you are able. Clear fluids include water, ice chips, low-calorie sports drinks, and fruit juice that has water added to it (diluted fruit juice). Eat light and bland foods  in small amounts as you are able. These foods include bananas, applesauce, rice, lean meats, toast, and crackers. General instructions  Have a responsible adult stay with you for the time you are told. It is important to have someone help care for you until you are awake and alert. If you have sleep apnea, surgery and some medicines can increase your risk for breathing problems. Follow instructions from your health care provider about wearing your sleep device: When you are sleeping. This includes during daytime naps. While taking prescription pain medicines, sleeping medicines, or medicines that make you drowsy. Do not use any products that contain nicotine or tobacco. These products include cigarettes, chewing tobacco, and vaping devices, such as e-cigarettes. If you need help quitting, ask your health care provider. Contact a health care provider if: You feel nauseous or vomit every time you eat or drink. You feel light-headed. You are still sleepy or  having trouble with balance after 24 hours. You get a rash. You have a fever. You have redness or swelling around the IV site. Get help right away if: You have trouble breathing. You have new confusion after you get home. These symptoms may be an emergency. Get help right away. Call 911. Do not wait to see if the symptoms will go away. Do not drive yourself to the hospital. This information is not intended to replace advice given to you by your health care provider. Make sure you discuss any questions you have with your health care provider. Document Revised: 02/08/2022 Document Reviewed: 02/08/2022 Elsevier Patient Education  [place].

## 2022-12-01 ENCOUNTER — Encounter (HOSPITAL_COMMUNITY)
Admission: RE | Admit: 2022-12-01 | Discharge: 2022-12-01 | Disposition: A | Discharge status: Home / Self Care | Payer: Medicaid Other | Source: Ambulatory Visit | Attending: Internal Medicine | Admitting: Internal Medicine

## 2022-12-01 ENCOUNTER — Telehealth (INDEPENDENT_AMBULATORY_CARE_PROVIDER_SITE_OTHER): Payer: Self-pay | Admitting: *Deleted

## 2022-12-01 ENCOUNTER — Encounter (HOSPITAL_COMMUNITY): Payer: Self-pay

## 2022-12-01 DIAGNOSIS — D508 Other iron deficiency anemias: Secondary | ICD-10-CM

## 2022-12-01 DIAGNOSIS — E1165 Type 2 diabetes mellitus with hyperglycemia: Secondary | ICD-10-CM

## 2022-12-01 NOTE — Telephone Encounter (Signed)
Received message pt no showed for pre-op appt today.  Called pt, LMOVM

## 2022-12-06 ENCOUNTER — Ambulatory Visit (HOSPITAL_COMMUNITY): Admission: RE | Admit: 2022-12-06 | Payer: Medicaid Other | Source: Ambulatory Visit

## 2022-12-06 ENCOUNTER — Encounter (HOSPITAL_COMMUNITY): Admission: RE | Payer: Self-pay | Source: Ambulatory Visit

## 2022-12-06 SURGERY — COLONOSCOPY WITH PROPOFOL
Anesthesia: Monitor Anesthesia Care

## 2022-12-07 ENCOUNTER — Telehealth: Payer: Self-pay | Admitting: Family

## 2022-12-07 NOTE — Telephone Encounter (Signed)
11/17/22 progress note faxed [facility] w/ prescription SWO

## 2022-12-08 NOTE — Telephone Encounter (Signed)
LMOVM to call back 

## 2022-12-30 ENCOUNTER — Other Ambulatory Visit: Payer: Self-pay | Admitting: *Deleted

## 2023-01-04 ENCOUNTER — Ambulatory Visit: Payer: Medicaid Other | Attending: Internal Medicine | Admitting: *Deleted

## 2023-01-04 DIAGNOSIS — Z952 Presence of prosthetic heart valve: Secondary | ICD-10-CM | POA: Diagnosis not present

## 2023-01-04 DIAGNOSIS — Z5181 Encounter for therapeutic drug level monitoring: Secondary | ICD-10-CM | POA: Diagnosis not present

## 2023-01-04 LAB — POCT INR: INR: 2.5 (ref 2.0–3.0)

## 2023-01-04 NOTE — Patient Instructions (Signed)
Continue warfarin 1 tablet daily expcet for 1/2 a tablet  on Monday, Wednesday and Friday.  Recheck INR in 4 weeks.

## 2023-01-11 ENCOUNTER — Ambulatory Visit: Payer: Medicaid Other | Admitting: Family Medicine

## 2023-01-23 ENCOUNTER — Other Ambulatory Visit: Payer: Self-pay

## 2023-01-23 ENCOUNTER — Inpatient Hospital Stay (HOSPITAL_COMMUNITY)
Admission: EM | Admit: 2023-01-23 | Discharge: 2023-02-03 | DRG: 464 | Disposition: A | Discharge status: Home / Self Care | Payer: Medicaid Other | Attending: Family Medicine | Admitting: Family Medicine

## 2023-01-23 ENCOUNTER — Emergency Department (HOSPITAL_COMMUNITY): Payer: Medicaid Other

## 2023-01-23 DIAGNOSIS — Z1624 Resistance to multiple antibiotics: Secondary | ICD-10-CM | POA: Diagnosis present

## 2023-01-23 DIAGNOSIS — T8781 Dehiscence of amputation stump: Secondary | ICD-10-CM | POA: Diagnosis present

## 2023-01-23 DIAGNOSIS — Z794 Long term (current) use of insulin: Secondary | ICD-10-CM | POA: Diagnosis not present

## 2023-01-23 DIAGNOSIS — I2510 Atherosclerotic heart disease of native coronary artery without angina pectoris: Secondary | ICD-10-CM | POA: Diagnosis present

## 2023-01-23 DIAGNOSIS — I350 Nonrheumatic aortic (valve) stenosis: Secondary | ICD-10-CM | POA: Diagnosis not present

## 2023-01-23 DIAGNOSIS — Z952 Presence of prosthetic heart valve: Secondary | ICD-10-CM | POA: Diagnosis not present

## 2023-01-23 DIAGNOSIS — E785 Hyperlipidemia, unspecified: Secondary | ICD-10-CM | POA: Diagnosis not present

## 2023-01-23 DIAGNOSIS — E782 Mixed hyperlipidemia: Secondary | ICD-10-CM | POA: Diagnosis present

## 2023-01-23 DIAGNOSIS — K219 Gastro-esophageal reflux disease without esophagitis: Secondary | ICD-10-CM

## 2023-01-23 DIAGNOSIS — Z825 Family history of asthma and other chronic lower respiratory diseases: Secondary | ICD-10-CM | POA: Diagnosis not present

## 2023-01-23 DIAGNOSIS — E871 Hypo-osmolality and hyponatremia: Secondary | ICD-10-CM | POA: Diagnosis present

## 2023-01-23 DIAGNOSIS — Z83438 Family history of other disorder of lipoprotein metabolism and other lipidemia: Secondary | ICD-10-CM

## 2023-01-23 DIAGNOSIS — E8809 Other disorders of plasma-protein metabolism, not elsewhere classified: Secondary | ICD-10-CM | POA: Diagnosis present

## 2023-01-23 DIAGNOSIS — I10 Essential (primary) hypertension: Secondary | ICD-10-CM | POA: Diagnosis present

## 2023-01-23 DIAGNOSIS — L03116 Cellulitis of left lower limb: Secondary | ICD-10-CM | POA: Diagnosis present

## 2023-01-23 DIAGNOSIS — E1169 Type 2 diabetes mellitus with other specified complication: Secondary | ICD-10-CM | POA: Diagnosis present

## 2023-01-23 DIAGNOSIS — Z6841 Body Mass Index (BMI) 40.0 and over, adult: Secondary | ICD-10-CM | POA: Diagnosis not present

## 2023-01-23 DIAGNOSIS — F419 Anxiety disorder, unspecified: Secondary | ICD-10-CM | POA: Diagnosis present

## 2023-01-23 DIAGNOSIS — Z5189 Encounter for other specified aftercare: Principal | ICD-10-CM

## 2023-01-23 DIAGNOSIS — D62 Acute posthemorrhagic anemia: Secondary | ICD-10-CM | POA: Diagnosis not present

## 2023-01-23 DIAGNOSIS — Y835 Amputation of limb(s) as the cause of abnormal reaction of the patient, or of later complication, without mention of misadventure at the time of the procedure: Secondary | ICD-10-CM | POA: Diagnosis present

## 2023-01-23 DIAGNOSIS — Z79899 Other long term (current) drug therapy: Secondary | ICD-10-CM

## 2023-01-23 DIAGNOSIS — T8742 Infection of amputation stump, left upper extremity: Secondary | ICD-10-CM | POA: Diagnosis not present

## 2023-01-23 DIAGNOSIS — Z23 Encounter for immunization: Secondary | ICD-10-CM | POA: Diagnosis not present

## 2023-01-23 DIAGNOSIS — T8744 Infection of amputation stump, left lower extremity: Principal | ICD-10-CM | POA: Diagnosis present

## 2023-01-23 DIAGNOSIS — L821 Other seborrheic keratosis: Secondary | ICD-10-CM | POA: Diagnosis present

## 2023-01-23 DIAGNOSIS — F418 Other specified anxiety disorders: Secondary | ICD-10-CM | POA: Diagnosis not present

## 2023-01-23 DIAGNOSIS — T8789 Other complications of amputation stump: Secondary | ICD-10-CM | POA: Diagnosis not present

## 2023-01-23 DIAGNOSIS — Z7984 Long term (current) use of oral hypoglycemic drugs: Secondary | ICD-10-CM | POA: Diagnosis not present

## 2023-01-23 DIAGNOSIS — E876 Hypokalemia: Secondary | ICD-10-CM | POA: Insufficient documentation

## 2023-01-23 DIAGNOSIS — Z87891 Personal history of nicotine dependence: Secondary | ICD-10-CM

## 2023-01-23 DIAGNOSIS — L02416 Cutaneous abscess of left lower limb: Secondary | ICD-10-CM | POA: Diagnosis present

## 2023-01-23 DIAGNOSIS — E119 Type 2 diabetes mellitus without complications: Secondary | ICD-10-CM | POA: Diagnosis not present

## 2023-01-23 DIAGNOSIS — E1165 Type 2 diabetes mellitus with hyperglycemia: Secondary | ICD-10-CM | POA: Diagnosis present

## 2023-01-23 DIAGNOSIS — Z7901 Long term (current) use of anticoagulants: Secondary | ICD-10-CM | POA: Diagnosis not present

## 2023-01-23 DIAGNOSIS — Z954 Presence of other heart-valve replacement: Secondary | ICD-10-CM

## 2023-01-23 DIAGNOSIS — Z8249 Family history of ischemic heart disease and other diseases of the circulatory system: Secondary | ICD-10-CM

## 2023-01-23 DIAGNOSIS — B9562 Methicillin resistant Staphylococcus aureus infection as the cause of diseases classified elsewhere: Secondary | ICD-10-CM | POA: Diagnosis not present

## 2023-01-23 DIAGNOSIS — E66813 Obesity, class 3: Secondary | ICD-10-CM | POA: Diagnosis present

## 2023-01-23 DIAGNOSIS — Z823 Family history of stroke: Secondary | ICD-10-CM

## 2023-01-23 DIAGNOSIS — Z803 Family history of malignant neoplasm of breast: Secondary | ICD-10-CM

## 2023-01-23 DIAGNOSIS — Z800 Family history of malignant neoplasm of digestive organs: Secondary | ICD-10-CM

## 2023-01-23 LAB — AEROBIC/ANAEROBIC CULTURE W GRAM STAIN (SURGICAL/DEEP WOUND)

## 2023-01-23 LAB — BASIC METABOLIC PANEL
Anion gap: 13 (ref 5–15)
BUN: 18 mg/dL (ref 6–20)
CO2: 19 mmol/L — ABNORMAL LOW (ref 22–32)
Calcium: 8.4 mg/dL — ABNORMAL LOW (ref 8.9–10.3)
Chloride: 96 mmol/L — ABNORMAL LOW (ref 98–111)
Creatinine, Ser: 0.82 mg/dL (ref 0.61–1.24)
GFR, Estimated: >60 mL/min (ref >60)
Glucose, Bld: 450 mg/dL — ABNORMAL HIGH (ref 70–99)
Potassium: 3.5 mmol/L (ref 3.5–5.1)
Sodium: 128 mmol/L — ABNORMAL LOW (ref 135–145)

## 2023-01-23 LAB — CBC WITH DIFFERENTIAL/PLATELET
Abs Immature Granulocytes: 0.3 10*3/uL — ABNORMAL HIGH (ref 0.00–0.07)
Basophils Absolute: 0 10*3/uL (ref 0.0–0.1)
Basophils Relative: 0 %
Eosinophils Absolute: 0 10*3/uL (ref 0.0–0.5)
Eosinophils Relative: 0 %
HCT: 33.3 % — ABNORMAL LOW (ref 39.0–52.0)
Hemoglobin: 10.8 g/dL — ABNORMAL LOW (ref 13.0–17.0)
Lymphocytes Relative: 16 %
Lymphs Abs: 1.7 10*3/uL (ref 0.7–4.0)
MCH: 28.3 pg (ref 26.0–34.0)
MCHC: 32.4 g/dL (ref 30.0–36.0)
MCV: 87.2 fL (ref 80.0–100.0)
Metamyelocytes Relative: 1 %
Monocytes Absolute: 0.7 10*3/uL (ref 0.1–1.0)
Monocytes Relative: 7 %
Myelocytes: 1 %
Neutro Abs: 7.8 10*3/uL — ABNORMAL HIGH (ref 1.7–7.7)
Neutrophils Relative %: 74 %
Platelets: 261 10*3/uL (ref 150–400)
Promyelocytes Relative: 1 %
RBC: 3.82 MIL/uL — ABNORMAL LOW (ref 4.22–5.81)
RDW: 13.3 % (ref 11.5–15.5)
WBC: 10.6 10*3/uL — ABNORMAL HIGH (ref 4.0–10.5)
nRBC: 0 % (ref 0.0–0.2)

## 2023-01-23 LAB — HIV ANTIBODY (ROUTINE TESTING W REFLEX): HIV Screen 4th Generation wRfx: NONREACTIVE

## 2023-01-23 LAB — PROTIME-INR
INR: 1.3 — ABNORMAL HIGH (ref 0.8–1.2)
Prothrombin Time: 16.4 seconds — ABNORMAL HIGH (ref 11.4–15.2)

## 2023-01-23 LAB — GLUCOSE, CAPILLARY: Glucose-Capillary: 327 mg/dL — ABNORMAL HIGH (ref 70–99)

## 2023-01-23 LAB — CBG MONITORING, ED: Glucose-Capillary: 466 mg/dL — ABNORMAL HIGH (ref 70–99)

## 2023-01-23 MED ORDER — EZETIMIBE 10 MG PO TABS
10.0000 mg | ORAL_TABLET | Freq: Every day | ORAL | Status: DC
  Administered 2023-01-24 – 2023-02-03 (×10): 10 mg via ORAL
  Filled 2023-01-23 (×10): qty 1

## 2023-01-23 MED ORDER — ONDANSETRON HCL 4 MG PO TABS: 4.0000 mg | ORAL_TABLET | Freq: Four times a day (QID) | ORAL | Status: DC | PRN

## 2023-01-23 MED ORDER — ACETAMINOPHEN 650 MG RE SUPP: 650.0000 mg | Freq: Four times a day (QID) | RECTAL | Status: DC | PRN

## 2023-01-23 MED ORDER — GABAPENTIN 300 MG PO CAPS
600.0000 mg | ORAL_CAPSULE | Freq: Two times a day (BID) | ORAL | Status: DC
  Administered 2023-01-23 – 2023-02-03 (×21): 600 mg via ORAL
  Filled 2023-01-23 (×21): qty 2

## 2023-01-23 MED ORDER — VANCOMYCIN HCL IN DEXTROSE 1-5 GM/200ML-% IV SOLN
1000.0000 mg | Freq: Once | INTRAVENOUS | Status: AC
  Administered 2023-01-23: 1000 mg via INTRAVENOUS
  Filled 2023-01-23: qty 200

## 2023-01-23 MED ORDER — SODIUM CHLORIDE 0.9 % IV SOLN
1.0000 g | Freq: Three times a day (TID) | INTRAVENOUS | Status: DC
  Administered 2023-01-24 – 2023-01-27 (×11): 1 g via INTRAVENOUS
  Filled 2023-01-23 (×15): qty 20

## 2023-01-23 MED ORDER — SERTRALINE HCL 50 MG PO TABS
50.0000 mg | ORAL_TABLET | Freq: Every day | ORAL | Status: DC
  Administered 2023-01-24 – 2023-02-03 (×10): 50 mg via ORAL
  Filled 2023-01-23 (×10): qty 1

## 2023-01-23 MED ORDER — NYSTATIN 100000 UNIT/GM EX CREA
TOPICAL_CREAM | Freq: Two times a day (BID) | CUTANEOUS | Status: DC
  Filled 2023-01-23 (×2): qty 30

## 2023-01-23 MED ORDER — LISINOPRIL 20 MG PO TABS
20.0000 mg | ORAL_TABLET | Freq: Every day | ORAL | Status: DC
  Administered 2023-01-24 – 2023-02-03 (×11): 20 mg via ORAL
  Filled 2023-01-23 (×11): qty 1

## 2023-01-23 MED ORDER — OXYCODONE HCL 5 MG PO TABS
5.0000 mg | ORAL_TABLET | ORAL | Status: DC | PRN
  Administered 2023-01-23 – 2023-01-26 (×7): 5 mg via ORAL
  Filled 2023-01-23 (×7): qty 1

## 2023-01-23 MED ORDER — SODIUM CHLORIDE 0.9 % IV SOLN
2.0000 g | Freq: Once | INTRAVENOUS | Status: AC
  Administered 2023-01-23: 2 g via INTRAVENOUS
  Filled 2023-01-23: qty 40

## 2023-01-23 MED ORDER — ACETAMINOPHEN 325 MG PO TABS: 650.0000 mg | ORAL_TABLET | Freq: Four times a day (QID) | ORAL | Status: DC | PRN

## 2023-01-23 MED ORDER — ONDANSETRON HCL 4 MG/2ML IJ SOLN: 4.0000 mg | Freq: Four times a day (QID) | INTRAMUSCULAR | Status: DC | PRN

## 2023-01-23 MED ORDER — MORPHINE SULFATE (PF) 2 MG/ML IV SOLN: 2.0000 mg | INTRAVENOUS | Status: DC | PRN

## 2023-01-23 MED ORDER — PANTOPRAZOLE SODIUM 40 MG PO TBEC
40.0000 mg | DELAYED_RELEASE_TABLET | Freq: Every day | ORAL | Status: DC
  Administered 2023-01-24 – 2023-02-03 (×11): 40 mg via ORAL
  Filled 2023-01-23 (×11): qty 1

## 2023-01-23 MED ORDER — METOPROLOL TARTRATE 25 MG PO TABS
25.0000 mg | ORAL_TABLET | Freq: Two times a day (BID) | ORAL | Status: DC
  Administered 2023-01-23 – 2023-02-03 (×21): 25 mg via ORAL
  Filled 2023-01-23 (×22): qty 1

## 2023-01-23 MED ORDER — INSULIN ASPART 100 UNIT/ML IJ SOLN
0.0000 [IU] | Freq: Every day | INTRAMUSCULAR | Status: DC
  Administered 2023-01-23: 4 [IU] via SUBCUTANEOUS
  Administered 2023-01-24 – 2023-01-25 (×2): 3 [IU] via SUBCUTANEOUS

## 2023-01-23 MED ORDER — INSULIN ASPART 100 UNIT/ML IJ SOLN
0.0000 [IU] | Freq: Three times a day (TID) | INTRAMUSCULAR | Status: DC
  Administered 2023-01-24: 8 [IU] via SUBCUTANEOUS
  Administered 2023-01-24 – 2023-01-25 (×3): 11 [IU] via SUBCUTANEOUS
  Administered 2023-01-25: 8 [IU] via SUBCUTANEOUS
  Administered 2023-01-25: 11 [IU] via SUBCUTANEOUS

## 2023-01-23 MED ORDER — IOHEXOL 300 MG/ML  SOLN
100.0000 mL | Freq: Once | INTRAMUSCULAR | Status: AC | PRN
  Administered 2023-01-23: 100 mL via INTRAVENOUS

## 2023-01-23 MED ORDER — VANCOMYCIN HCL 1250 MG/250ML IV SOLN
1250.0000 mg | Freq: Three times a day (TID) | INTRAVENOUS | Status: DC
  Administered 2023-01-24 – 2023-01-25 (×5): 1250 mg via INTRAVENOUS
  Filled 2023-01-23 (×6): qty 250

## 2023-01-23 NOTE — ED Triage Notes (Signed)
Patient presents with left wound which he says was caused by his prosthetic limb. Patient states he noticed it was bleeding several days ago. Hx of osteomyelitis and DM.

## 2023-01-23 NOTE — ED Notes (Signed)
[facility] arrived  

## 2023-01-23 NOTE — Progress Notes (Signed)
Pharmacy Antibiotic Note  Adam Long is a 47 y.o. male admitted on 01/23/2023 with  wound infection .  Pharmacy has been consulted for vanc dosing.  Pt with a hx of chronic wound issue post BKA. He presented with wound issue that now has drainage. He is morbidly obese. His wound cultures in 2023 showed ESBL klebsiella, enterococcus, clostridium perfringens, e.coli, proteus. He also had MSSA and group B strep bacteremia. D/w Dr. Carren Rang, we will also add empiric merrem.    Scr 0.82  Plan: Additional vanc 1g x1 to make 2g load then 1.25g IV q8>> AUC 443, scr 0.82  Merrem 2g x1 then 1g IV q8 Levels if needed  Height: 5\' 10"  (177.8 cm) Weight: (!) 167.8 kg (370 lb) IBW/kg (Calculated) : 73  Temp (24hrs), Avg:98.4 F (36.9 C), Min:98.3 F (36.8 C), Max:98.5 F (36.9 C)  Recent Labs  Lab 01/23/23 1518  WBC 10.6*  CREATININE 0.82    Estimated Creatinine Clearance: 176.6 mL/min (by C-G formula based on SCr of 0.82 mg/dL).    Allergies  Allergen Reactions   Pollen Extract Other (See Comments)    Runny nose, watery eyes, sneezing    Antimicrobials this admission: 4/28 vanc>> 4/28 merrem>>  Dose adjustments this admission:   Microbiology results: 4/28 wound>>   [name], PharmD, [place], AAHIVP, CPP Infectious Disease Pharmacist 01/23/2023 9:50 PM

## 2023-01-23 NOTE — ED Provider Notes (Signed)
[place] EMERGENCY DEPARTMENT AT [facility] Provider Note   CSN: [id number] Arrival date & time: 01/23/23  1439     History {Add pertinent medical, surgical, social history, OB history to HPI:1} Chief Complaint  Patient presents with   Wound Check    Adam Long is a 47 y.o. male.  Patient has a history of diabetes hypertension and below the knee amputation on the left side.  His amputation was done by Dr. Lajoyce Corners in February.  He now has a drainage area that is about the size of a quarter at the distal part of his leg.   Wound Check       Home Medications Prior to Admission medications   Medication Sig Start Date End Date Taking? Authorizing Provider  ezetimibe (ZETIA) 10 MG tablet Take 1 tablet (10 mg total) by mouth daily. 10/12/22   [name], DO  gabapentin (NEURONTIN) 300 MG capsule Take 2 capsules (600 mg total) by mouth 2 (two) times daily. 10/12/22   [name], DO  lisinopril (ZESTRIL) 20 MG tablet Take 1 tablet (20 mg total) by mouth daily. 10/12/22   [name], DO  metFORMIN (GLUCOPHAGE) 500 MG tablet Take 2 tablets (1,000 mg total) by mouth 2 (two) times daily with a meal. 10/12/22   [name], DO  metoprolol tartrate (LOPRESSOR) 25 MG tablet Take 1 tablet (25 mg total) by mouth 2 (two) times daily. 10/12/22   [name], DO  pantoprazole (PROTONIX) 40 MG tablet Take 1 tablet (40 mg total) by mouth daily. 10/12/22   [name], DO  sertraline (ZOLOFT) 50 MG tablet Take 1 tablet (50 mg total) by mouth daily. 10/12/22   [name], DO  warfarin (COUMADIN) 10 MG tablet Take 1 tablet daily except 1/2 tablet on Mondays, Wednesdays and Fridays or as directed 02/10/22   Jonelle Sidle, MD      Allergies    Pollen extract    Review of Systems   Review of Systems  Physical Exam Updated Vital Signs BP 129/65   Pulse 87   Temp 98.4 F (36.9 C) (Oral)   Resp 18   Ht 5\' 10"  (1.778 m)   Wt (!) 167.8 kg   SpO2 98%   BMI 53.09  kg/m  Physical Exam  ED Results / Procedures / Treatments   Labs (all labs ordered are listed, but only abnormal results are displayed) Labs Reviewed  CBC WITH DIFFERENTIAL/PLATELET - Abnormal; Notable for the following components:      Result Value   WBC 10.6 (*)    RBC 3.82 (*)    Hemoglobin 10.8 (*)    HCT 33.3 (*)    Neutro Abs 7.8 (*)    Abs Immature Granulocytes 0.30 (*)    All other components within normal limits  BASIC METABOLIC PANEL - Abnormal; Notable for the following components:   Sodium 128 (*)    Chloride 96 (*)    CO2 19 (*)    Glucose, Bld 450 (*)    Calcium 8.4 (*)    All other components within normal limits  PROTIME-INR - Abnormal; Notable for the following components:   Prothrombin Time 16.4 (*)    INR 1.3 (*)    All other components within normal limits  CBG MONITORING, ED - Abnormal; Notable for the following components:   Glucose-Capillary 466 (*)    All other components within normal limits  AEROBIC/ANAEROBIC CULTURE W GRAM STAIN (SURGICAL/DEEP  WOUND)    EKG None  Radiology CT TIBIA FIBULA LEFT W CONTRAST  Result Date: 01/23/2023 CLINICAL DATA:  Soft tissue mass, lower leg, deep. Patient reports a wound caused by his prosthetic limb with bleeding for several days. History of diabetes and osteomyelitis. EXAM: CT OF THE LOWER LEFT EXTREMITY WITH CONTRAST TECHNIQUE: Multidetector CT imaging of the left knee was performed according to the standard protocol following intravenous contrast administration. RADIATION DOSE REDUCTION: This exam was performed according to the departmental dose-optimization program which includes automated exposure control, adjustment of the mA and/or kV according to patient size and/or use of iterative reconstruction technique. CONTRAST:  OMNIPAQUE IOHEXOL 300 MG/ML  SOLN COMPARISON:  No recent comparison imaging. Correlation is made with preoperative radiographs of the left foot 08/27/2021 and MRI of the left foot  02/12/2021. FINDINGS: Bones/Joint/Cartilage Patient is status post below the knee amputation. Along the amputation margins of the proximal tibial and fibular diaphyses, there is cortical thickening and periosteal bone formation. No gross cortical destruction to suggest recurrent osteomyelitis. There is no evidence of acute fracture or dislocation. Tricompartmental degenerative changes are present at the knee without significant joint effusion. Ligaments Suboptimally assessed by CT. Muscles and Tendons Generalized muscular atrophy. The visualized quadriceps and patellar tendons are intact. No focal intramuscular fluid collections are identified. Soft tissues There is an open wound along the lateral aspect of the distal stump with medial extension of ill-defined collections of fluid and gas along the distal aspect of the stump, suspicious for soft tissue abscesses. Largest component measures up to 4.1 x 2.6 cm on image 110/5. There is surrounding subcutaneous edema and overlying dermal thickening. No focal fluid collections are seen more proximally. There is generalized subcutaneous edema within the lower leg. Prominent vascular calcifications are noted. IMPRESSION: 1. Open wound along the lateral aspect of the distal stump with medial extension of ill-defined collections of fluid and gas along the distal aspect of the stump, suspicious for soft tissue abscesses. Largest component measures up to 4.1 x 2.6 cm. 2. No gross cortical destruction to suggest recurrent osteomyelitis. There is cortical thickening and periosteal bone formation along the amputation margins of the proximal tibial and fibular diaphyses. 3. Generalized subcutaneous edema within the lower leg. 4. Left knee degenerative changes without significant joint effusion. Electronically Signed   By: Carey Bullocks M.D.   On: 01/23/2023 16:40    Procedures Procedures  {Document cardiac monitor, telemetry assessment procedure when  appropriate:1}  Medications Ordered in ED Medications  vancomycin (VANCOCIN) IVPB 1000 mg/200 mL premix (0 mg Intravenous Stopped 01/23/23 1709)  iohexol (OMNIPAQUE) 300 MG/ML solution 100 mL (100 mLs Intravenous Contrast Given 01/23/23 1612)    ED Course/ Medical Decision Making/ A&P   {I spoke with Dr. Roda Shutters orthopedics.  He states that medicine can admit the patient over to [facility] and his orthopedic group will see the patient tomorrow Click here for ABCD2, HEART and other calculatorsREFRESH Note before signing :1}                          Medical Decision Making Amount and/or Complexity of Data Reviewed Labs: ordered. Radiology: ordered.  Risk Prescription drug management. Decision regarding hospitalization.   Patient has an abscess and cellulitis to his stump area of his BKA on the left.  He will be admitted to medicine and he will be seen by orthopedics tomorrow with Dr. Audrie Lia group  {Document critical care time when appropriate:1} {Document  review of labs and clinical decision tools ie heart score, Chads2Vasc2 etc:1}  {Document your independent review of radiology images, and any outside records:1} {Document your discussion with family members, caretakers, and with consultants:1} {Document social determinants of health affecting pt's care:1} {Document your decision making why or why not admission, treatments were needed:1} Final Clinical Impression(s) / ED Diagnoses Final diagnoses:  Wound check, abscess    Rx / DC Orders ED Discharge Orders     None

## 2023-01-24 ENCOUNTER — Encounter (HOSPITAL_COMMUNITY): Payer: Self-pay | Admitting: Family Medicine

## 2023-01-24 DIAGNOSIS — L03116 Cellulitis of left lower limb: Secondary | ICD-10-CM

## 2023-01-24 DIAGNOSIS — I10 Essential (primary) hypertension: Secondary | ICD-10-CM | POA: Diagnosis not present

## 2023-01-24 DIAGNOSIS — E1165 Type 2 diabetes mellitus with hyperglycemia: Secondary | ICD-10-CM

## 2023-01-24 DIAGNOSIS — E1169 Type 2 diabetes mellitus with other specified complication: Secondary | ICD-10-CM | POA: Diagnosis not present

## 2023-01-24 DIAGNOSIS — L02416 Cutaneous abscess of left lower limb: Secondary | ICD-10-CM

## 2023-01-24 DIAGNOSIS — E876 Hypokalemia: Secondary | ICD-10-CM | POA: Insufficient documentation

## 2023-01-24 DIAGNOSIS — I2510 Atherosclerotic heart disease of native coronary artery without angina pectoris: Secondary | ICD-10-CM

## 2023-01-24 DIAGNOSIS — K219 Gastro-esophageal reflux disease without esophagitis: Secondary | ICD-10-CM

## 2023-01-24 DIAGNOSIS — Z954 Presence of other heart-valve replacement: Secondary | ICD-10-CM

## 2023-01-24 DIAGNOSIS — E785 Hyperlipidemia, unspecified: Secondary | ICD-10-CM

## 2023-01-24 DIAGNOSIS — F419 Anxiety disorder, unspecified: Secondary | ICD-10-CM | POA: Diagnosis not present

## 2023-01-24 LAB — COMPREHENSIVE METABOLIC PANEL
ALT: 25 U/L (ref 0–44)
AST: 25 U/L (ref 15–41)
Albumin: 1.9 g/dL — ABNORMAL LOW (ref 3.5–5.0)
Alkaline Phosphatase: 57 U/L (ref 38–126)
Anion gap: 8 (ref 5–15)
BUN: 13 mg/dL (ref 6–20)
CO2: 24 mmol/L (ref 22–32)
Calcium: 8.2 mg/dL — ABNORMAL LOW (ref 8.9–10.3)
Chloride: 97 mmol/L — ABNORMAL LOW (ref 98–111)
Creatinine, Ser: 0.84 mg/dL (ref 0.61–1.24)
GFR, Estimated: >60 mL/min (ref >60)
Glucose, Bld: 362 mg/dL — ABNORMAL HIGH (ref 70–99)
Potassium: 3.3 mmol/L — ABNORMAL LOW (ref 3.5–5.1)
Sodium: 129 mmol/L — ABNORMAL LOW (ref 135–145)
Total Bilirubin: 0.8 mg/dL (ref 0.3–1.2)
Total Protein: 6.3 g/dL — ABNORMAL LOW (ref 6.5–8.1)

## 2023-01-24 LAB — CBC WITH DIFFERENTIAL/PLATELET
Abs Immature Granulocytes: 0 10*3/uL (ref 0.00–0.07)
Basophils Absolute: 0 10*3/uL (ref 0.0–0.1)
Basophils Relative: 0 %
Eosinophils Absolute: 0.2 10*3/uL (ref 0.0–0.5)
Eosinophils Relative: 2 %
HCT: 31.4 % — ABNORMAL LOW (ref 39.0–52.0)
Hemoglobin: 10.5 g/dL — ABNORMAL LOW (ref 13.0–17.0)
Lymphocytes Relative: 17 %
Lymphs Abs: 1.6 10*3/uL (ref 0.7–4.0)
MCH: 28.5 pg (ref 26.0–34.0)
MCHC: 33.4 g/dL (ref 30.0–36.0)
MCV: 85.1 fL (ref 80.0–100.0)
Monocytes Absolute: 0.3 10*3/uL (ref 0.1–1.0)
Monocytes Relative: 3 %
Neutro Abs: 7.2 10*3/uL (ref 1.7–7.7)
Neutrophils Relative %: 78 %
Platelets: 249 10*3/uL (ref 150–400)
RBC: 3.69 MIL/uL — ABNORMAL LOW (ref 4.22–5.81)
RDW: 13.2 % (ref 11.5–15.5)
WBC: 9.2 10*3/uL (ref 4.0–10.5)
nRBC: 0 /100 WBC
nRBC: 0.2 % (ref 0.0–0.2)

## 2023-01-24 LAB — GLUCOSE, CAPILLARY
Glucose-Capillary: 326 mg/dL — ABNORMAL HIGH (ref 70–99)
Glucose-Capillary: 324 mg/dL — ABNORMAL HIGH (ref 70–99)
Glucose-Capillary: 275 mg/dL — ABNORMAL HIGH (ref 70–99)
Glucose-Capillary: 282 mg/dL — ABNORMAL HIGH (ref 70–99)

## 2023-01-24 LAB — HEMOGLOBIN A1C
Hgb A1c MFr Bld: 13.4 % — ABNORMAL HIGH (ref 4.8–5.6)
Mean Plasma Glucose: 337.88 mg/dL

## 2023-01-24 LAB — AEROBIC/ANAEROBIC CULTURE W GRAM STAIN (SURGICAL/DEEP WOUND)

## 2023-01-24 LAB — MAGNESIUM: Magnesium: 1.9 mg/dL (ref 1.7–2.4)

## 2023-01-24 MED ORDER — PNEUMOCOCCAL 20-VAL CONJ VACC 0.5 ML IM SUSY
0.5000 mL | PREFILLED_SYRINGE | INTRAMUSCULAR | Status: AC
  Administered 2023-01-25: 0.5 mL via INTRAMUSCULAR
  Filled 2023-01-24: qty 0.5

## 2023-01-24 MED ORDER — POTASSIUM CHLORIDE CRYS ER 20 MEQ PO TBCR
40.0000 meq | EXTENDED_RELEASE_TABLET | ORAL | Status: AC
  Administered 2023-01-24 (×2): 40 meq via ORAL
  Filled 2023-01-24 (×2): qty 2

## 2023-01-24 MED ORDER — INSULIN GLARGINE-YFGN 100 UNIT/ML ~~LOC~~ SOLN
10.0000 [IU] | Freq: Every day | SUBCUTANEOUS | Status: DC
  Administered 2023-01-24: 10 [IU] via SUBCUTANEOUS
  Filled 2023-01-24 (×2): qty 0.1

## 2023-01-24 NOTE — Inpatient Diabetes Management (Addendum)
Inpatient Diabetes Program Recommendations  AACE/ADA: New Consensus Statement on Inpatient Glycemic Control (2015)  Target Ranges:  Prepandial:   less than 140 mg/dL      Peak postprandial:   less than 180 mg/dL (1-2 hours)      Critically ill patients:  140 - 180 mg/dL   Lab Results  Component Value Date   GLUCAP 324 (H) 01/24/2023   HGBA1C 13.4 (H) 01/24/2023    Review of Glycemic Control  Latest Reference Range & Units 01/23/23 14:47 01/23/23 21:11 01/24/23 07:32 01/24/23 12:03  Glucose-Capillary 70 - 99 mg/dL 540 (H) 981 (H) 191 (H) 324 (H)   Diabetes history: DM 2 Outpatient Diabetes medications:  Metformin 1000 mg bid Current orders for Inpatient glycemic control:  Novolog 0-15 units tid with meals and HS Semglee 10 units daily  Inpatient Diabetes Program Recommendations:    Note very elevated A1C.  It appears that patient will need insulin restarted at home.  I will discuss with patient.   Addendum 1600:  Spoke with patient by phone and he states he was taken off insulin over a year.  He is open to taking insulin again.  We discussed his A1C of 13.4% and compared to A1C of last year.  He states in the past he was taking Lantus.  He is also interested in CGM for monitoring if it is covered on his insurance.  Will ask for Pharmacy review to see which insulins and CGM are preferred.  Recommend increasing Semglee to 30 units daily and add Novolog meal coverage 5 units tid with meals.   Thanks,  Beryl Meager, RN, BC-ADM Inpatient Diabetes Coordinator Pager [phone]  (8a-5p)

## 2023-01-24 NOTE — Assessment & Plan Note (Addendum)
Left BKA with stump infection.  Patient has been afebrile, wbc is 9,2 Cultures have bee no growth. CT with signs of local abscess.  Prior infection with E coli ESBL.   Plan to continue antibiotic therapy with IV vancomycin and meropenem.  Plan for I&D in 48 hrs in the OR.  Continue pain control with oral oxycodone and IV morphine.

## 2023-01-24 NOTE — Assessment & Plan Note (Signed)
PPI ?

## 2023-01-24 NOTE — Assessment & Plan Note (Deleted)
-   On left lower extremity stump - Ulcer has been cultured - Imaging shows multiple abscesses - Continue vancomycin - Add Merrem after discussion with pharmacy and previous culture showing ESBL E. coli - Unfortunately blood cultures were not done before antibiotics - Patient's only SIRS criteria is tachycardia - Location is at previous surgical wound for amputation-Ortho consulted from the ED and plans to see patient at [facility] - Continue to monitor

## 2023-01-24 NOTE — Assessment & Plan Note (Addendum)
Continue with ezetimibe  

## 2023-01-24 NOTE — Plan of Care (Signed)

## 2023-01-24 NOTE — TOC CM/SW Note (Signed)
  Transition of Care [facility]) Screening Note   Patient Details  Name: Adam Long Date of Birth: 1976-07-28   Plan for I and D , Wednesday .    Transition of Care Department [facility])  will continue to monitor patient advancement through interdisciplinary progression rounds. If new patient transition needs arise, please place a TOC consult.

## 2023-01-24 NOTE — H&P (Signed)
History and Physical    Patient: Adam Long ZOX:[id number] DOB: 1976-07-19 DOA: 01/23/2023 DOS: the patient was seen and examined on 01/24/2023 PCP: Adam Sams, DO  Patient coming from: Home  Chief Complaint:  Chief Complaint  Patient presents with   Wound Check   HPI: Adam Long is a 47 y.o. male with medical history significant of anxiety, coronary artery disease, depression, hypertension, hyperlipidemia, diabetes mellitus type 2, obesity, aortic valve replacement with mechanical valve presents the ED with a chief complaint of left residual lower extremity pain.  Patient reports that it started hurting 5 days ago.  He was wearing his prosthesis every day, but he stopped wearing it 5 days ago because of the pain.  Up until the day prior to presentation there was no drainage it was just painful and then gradually had more erythema.  Reports the stump stays slightly red and irritated from an ill fitting prosthesis, but within the last couple days it became more erythematous.  On the day prior to presentation he noticed that there was an ulcer bleeding on his stump so he cleaned it and wrapped it.  When he took off the wrapping on the day of presentation he reports a blood clot fell out of the ulcer and the pain was much worse so he decided to come into the ER.  Patient reports the pain is described as sharp and aching.  It is worse with weightbearing.  He tried to take Tylenol and reports taking up to 2 g of Tylenol at a time about twice per day.  He has been educated on the safe limits of Tylenol.  He reports that the Tylenol helped very little.  Putting ice on it did help.  He does not think he has had any fevers.  He has had no nausea or decrease in appetite.  Patient reports that he is aware of how hyperglycemic he was today in the ER.  His glucoses 450.  He reports he did not take any of his meds today and had just eaten so that is why his glucose is elevated.  At baseline he thinks his  glucose is 200-250.  Patient reports he used to take insulin at home, but he was taken off of it.  When he used to take insulin he was also on Janumet which she was taken off of.  His only medication for diabetes mellitus type 2 at home is metformin per his report.  He does not tolerate metformin well and reports that he still has loose stools from it.  Patient has no other complaints at this time.  Patient does not smoke, he drinks alcohol once per week, he does not use illicit drugs, he is vaccinated for COVID, patient is full code. Review of Systems: As mentioned in the history of present illness. All other systems reviewed and are negative. Past Medical History:  Diagnosis Date   Anxiety    Aortic stenosis    Cellulitis and abscess of lower extremity 06/11/2019   Cellulitis of fourth toe of left foot    Cholelithiasis    Coronary artery disease    Nonobstructive CAD (40-50% LAD) 08/2019   Depression    Diabetes mellitus without complication (HCC)    Phreesia 09/27/2020   Elevated troponin level not due myocardial infarction 11/11/2019   Essential hypertension    Gangrene of toe of left foot (HCC) 07/06/2019   Heart murmur    Phreesia 09/27/2020   Hyperlipidemia  Phreesia 09/27/2020   Hypertension    Phreesia 09/27/2020   Mixed hyperlipidemia    Morbid obesity (HCC)    S/P aortic valve replacement with mechanical valve 12/05/2019   25 mm Carbomedics top hat bileaflet mechanical valve via partial upper hemi-sternotomy   Severe aortic stenosis 09/24/2019   Type 2 diabetes mellitus (HCC)    Past Surgical History:  Procedure Laterality Date   ABDOMINAL AORTOGRAM W/LOWER EXTREMITY N/A 07/06/2019   Procedure: ABDOMINAL AORTOGRAM W/LOWER EXTREMITY;  Surgeon: Sherren Kerns, MD;  Location: [facility] INVASIVE CV LAB;  Service: Cardiovascular;  Laterality: N/A;  Bilateral   AMPUTATION Left 07/09/2019   Procedure: LEFT FOURTH and Fifth TOE AMPUTATION.;  Surgeon: Larina Earthly, MD;   Location: [facility] OR;  Service: Vascular;  Laterality: Left;   AMPUTATION Left 03/11/2021   Procedure: LEFT FOOT 5TH  AND 4TH RAY AMPUTATION;  Surgeon: Nadara Mustard, MD;  Location: [facility] OR;  Service: Orthopedics;  Laterality: Left;   AMPUTATION Left 08/28/2021   Procedure: AMPUTATION BELOW KNEE;  Surgeon: Nadara Mustard, MD;  Location: [facility] OR;  Service: Orthopedics;  Laterality: Left;   AORTIC VALVE REPLACEMENT N/A 12/05/2019   Procedure: PARTIAL STERNOTOMY FOR AORTIC VALVE REPLACEMENT (AVR), USING CARBOMEDICS SUPRA-ANNULAR TOP HAT ;  Surgeon: Purcell Nails, MD;  Location: [facility] OR;  Service: Open Heart Surgery;  Laterality: N/A;  No neck lines on left   APPLICATION OF WOUND VAC Left 10/30/2021   Procedure: APPLICATION OF WOUND VAC;  Surgeon: Nadara Mustard, MD;  Location: [facility] OR;  Service: Orthopedics;  Laterality: Left;   APPLICATION OF WOUND VAC Left 12/25/2021   Procedure: APPLICATION OF WOUND VAC;  Surgeon: Nadara Mustard, MD;  Location: [facility] OR;  Service: Orthopedics;  Laterality: Left;   CARDIAC VALVE REPLACEMENT N/A    Phreesia 09/27/2020   IR RADIOLOGY PERIPHERAL GUIDED IV START  10/05/2019   IR US GUIDE VASC ACCESS RIGHT  10/05/2019   MULTIPLE EXTRACTIONS WITH ALVEOLOPLASTY N/A 10/26/2019   Procedure: EXTRACTION OF TOOTH #'S 3, 5-11,19-28,  AND 32 WITH ALVEOLOPLASTY;  Surgeon: Charlynne Pander, DDS;  Location: [facility] OR;  Service: Oral Surgery;  Laterality: N/A;   RIGHT HEART CATH AND CORONARY ANGIOGRAPHY N/A 09/24/2019   Procedure: RIGHT HEART CATH AND CORONARY ANGIOGRAPHY;  Surgeon: Tonny Bollman, MD;  Location[facility]IVE CV LAB;  Service: Cardiovascular;  Laterality: N/A;   STUMP REVISION Left 10/30/2021   Procedure: REVISION LEFT BELOW KNEE AMPUTATION;  Surgeon: Nadara Mustard, MD;  Location: [facility] OR;  Service: Orthopedics;  Laterality: Left;   STUMP REVISION Left 12/25/2021   Procedure: REVISION LEFT BELOW KNEE AMPUTATION;  Surgeon: Nadara Mustard, MD;  Location: [facility] OR;  Service: Orthopedics;   Laterality: Left;   TEE WITHOUT CARDIOVERSION N/A 12/05/2019   Procedure: TRANSESOPHAGEAL ECHOCARDIOGRAM (TEE);  Surgeon: Purcell Nails, MD;  Location: [facility] OR;  Service: Open Heart Surgery;  Laterality: N/A;   TEE WITHOUT CARDIOVERSION N/A 09/01/2021   Procedure: TRANSESOPHAGEAL ECHOCARDIOGRAM (TEE);  Surgeon: Little Ishikawa, MD;  Location: [facility] ENDOSCOPY;  Service: Cardiovascular;  Laterality: N/A;   Social History:  reports that he has never smoked. He quit smokeless tobacco use about 3 years ago.  His smokeless tobacco use included chew. He reports current alcohol use of about 4.0 standard drinks of alcohol per week. He reports that he does not use drugs.  Allergies  Allergen Reactions   Pollen Extract Other (See Comments)    Runny nose, watery eyes, sneezing  Family History  Problem Relation Age of Onset   Cancer Mother        Brain tumor   Heart disease Father    Hyperlipidemia Father    Hypertension Father    Stroke Father    Heart murmur Sister    Heart attack Maternal Grandmother    Liver disease Maternal Grandfather    Breast cancer Paternal Grandmother    COPD Paternal Grandfather    Heart attack Maternal Aunt    Colon cancer Maternal Uncle 67   Pancreatic cancer Paternal Aunt    Heart disease Paternal Uncle    Prostate cancer Neg Hx     Prior to Admission medications   Medication Sig Start Date End Date Taking? Authorizing Provider  ezetimibe (ZETIA) 10 MG tablet Take 1 tablet (10 mg total) by mouth daily. 10/12/22   Adam Sams, DO  gabapentin (NEURONTIN) 300 MG capsule Take 2 capsules (600 mg total) by mouth 2 (two) times daily. 10/12/22   Adam Sams, DO  lisinopril (ZESTRIL) 20 MG tablet Take 1 tablet (20 mg total) by mouth daily. 10/12/22   Adam Sams, DO  metFORMIN (GLUCOPHAGE) 500 MG tablet Take 2 tablets (1,000 mg total) by mouth 2 (two) times daily with a meal. 10/12/22   Adam Sams, DO  metoprolol tartrate (LOPRESSOR) 25 MG tablet Take 1  tablet (25 mg total) by mouth 2 (two) times daily. 10/12/22   Adam Sams, DO  pantoprazole (PROTONIX) 40 MG tablet Take 1 tablet (40 mg total) by mouth daily. 10/12/22   Adam Sams, DO  sertraline (ZOLOFT) 50 MG tablet Take 1 tablet (50 mg total) by mouth daily. 10/12/22   Adam Sams, DO  warfarin (COUMADIN) 10 MG tablet Take 1 tablet daily except 1/2 tablet on Mondays, Wednesdays and Fridays or as directed 02/10/22   Jonelle Sidle, MD    Physical Exam: Vitals:   01/23/23 1900 01/23/23 2000 01/23/23 2109 01/23/23 2131  BP: 129/67 125/64 126/61   Pulse: 89 89 90   Resp:  18 15   Temp:   98.5 F (36.9 C)   TempSrc:   Oral   SpO2: 98% 98% 97% 97%  Weight:      Height:       1.  General: Patient lying supine in bed,  no acute distress   2. Psychiatric: Alert and oriented x 3, mood and behavior normal for situation, pleasant and cooperative with exam   3. Neurologic: Speech and language are normal, face is symmetric, moves all 4 extremities voluntarily, at baseline without acute deficits on limited exam   4. HEENMT:  Head is atraumatic, normocephalic, pupils reactive to light, neck is supple, trachea is midline, mucous membranes are moist   5. Respiratory : Lungs are clear to auscultation bilaterally without wheezing, rhonchi, rales, no cyanosis, no increase in work of breathing or accessory muscle use   6. Cardiovascular : Heart rate normal, rhythm is regular, murmur with mechanical aortic valve click, rubs or gallops, no peripheral edema, peripheral pulses palpated   7. Gastrointestinal:  Abdomen is soft, nondistended, nontender to palpation bowel sounds active, no masses or organomegaly palpated   8. Skin:  Skin on left residual lower extremity is erythematous, with about a quarter sized ulcer that has serous drainage.  No purulent drainage visualized.   9.Musculoskeletal:  No acute deformities or trauma, no asymmetry in tone, no peripheral edema, peripheral pulses  palpated, no tenderness to palpation in the  extremities  Data Reviewed: In the ED Temp 98.3, heart rate 87-106, respiratory 18-20, blood pressure 129/65-146/65, satting at 98% No leukocytosis with white blood cell count of 10.6, hemoglobin 10.8, platelets 261 Chemistry is unremarkable aside from a pseudohyponatremia at 128 and a hyperglycemia at 450 Tib-fib imaging shows open wound at the lateral aspect of the distal stump with medial extension.  There are ill-defined collections of fluid and gas are likely abscesses and the largest 1 is 4.1 x 2.6 cm.  No suggestions of osteomyelitis. Ortho was consulted from the ED and recommended admitting this patient to [facility] where his Ortho group is located and they would see him in the morning.  Assessment and Plan: * Cellulitis and abscess of left leg - On left lower extremity stump - Ulcer has been cultured - Imaging shows multiple abscesses - Continue vancomycin - Add Merrem after discussion with pharmacy and previous culture showing ESBL E. coli - Unfortunately blood cultures were not done before antibiotics - Patient's only SIRS criteria is tachycardia - Location is at previous surgical wound for amputation-Ortho consulted from the ED and plans to see patient at [facility] - Continue to monitor  GERD (gastroesophageal reflux disease) - Continue PPI  CAD (coronary artery disease) - Continue beta-blocker, ACE - No chest pain - Continue to monitor  Uncontrolled type 2 diabetes mellitus with hyperglycemia (HCC) - Reports taking no insulin at home - Holding home metformin - Sliding scale coverage - N.p.o. after midnight until Ortho is able to eval patient  Anxiety - Continue Zoloft  S/P aortic valve replacement with mechanical valve - Holding warfarin until patient is seen by Ortho  Hyperlipidemia associated with type 2 diabetes mellitus (HCC) - Continue Zetia  HTN (hypertension) - Continue ACE and beta-blocker       Advance Care Planning:   Code Status: Full Code  Consults: Ortho  Family Communication: No family at bedside  Severity of Illness: The appropriate patient status for this patient is INPATIENT. Inpatient status is judged to be reasonable and necessary in order to provide the required intensity of service to ensure the patient's safety. The patient's presenting symptoms, physical exam findings, and initial radiographic and laboratory data in the context of their chronic comorbidities is felt to place them at high risk for further clinical deterioration. Furthermore, it is not anticipated that the patient will be medically stable for discharge from the hospital within 2 midnights of admission.   * I certify that at the point of admission it is my clinical judgment that the patient will require inpatient hospital care spanning beyond 2 midnights from the point of admission due to high intensity of service, high risk for further deterioration and high frequency of surveillance required.*  Author: Lilyan Gilford, DO 01/24/2023 2:04 AM  For on call review [email or web address].

## 2023-01-24 NOTE — Assessment & Plan Note (Signed)
Renal function with serum cr at 0,88, K is 4.6 and serum bicarbonate at 24. Na is 130., corrected for hyperglycemia is 133. -Repeat labs in the AM.

## 2023-01-24 NOTE — Hospital Course (Signed)
Adam Long was admitted to the hospital with the working diagnosis of left leg stump infection.   47 yo male with the past medical history of coronary artery disease, hypertension, T2DM, dyslipidemia, sp aortic valve replacement, and obesity who presented with left lower extremity pain. Reported 5 days of pain at the level of his stump, he has not been able to use his prosthesis due to pain. Positive local erythema and bleeding ulcer. No fever or chills. On his initial physical examination his blood pressure was 129/67, HR 89, RR 18 and 02 saturation 97%, lungs with no wheezing or rales, heart with S1 and S2 present and rhythmic, abdomen with no distention and no lower extremity edema. Left stump with erythema, tender to palpation and a quarter size ulcer with serous drainage.   Na 129, K 3,5 Cl 96, bicarbonate 19, glucose 450, bun 18 cr 0,82  Wbc 10,6 hgb 10,8 plt 261   Left lower extremity CT with open wound along the lateral aspect of the distal stump with medial extension of ill defined collections of fluid and gas along the distal aspect of the stump, suspicious for soft tissue abscess. Largest component measures up to 4,1 x 2,6 cm.  No gross cortical destruction to suggest recurrent osteomyelitis. Generalized subcutaneous edema within the lower leg.  Left knee with degenerative changes with no significant joint effusion.   Patient was placed on IV antibiotic therapy.  Orthopedics was consulted with recommendations for I&D in the OR.

## 2023-01-24 NOTE — Assessment & Plan Note (Addendum)
INR is 1.5 Plan to continue to holding warfarin in preparation for left stump I&D.  -Place on IV heparin due to mechanical valve, orthopedics to advise when IV heparin needs to be held prior to procedure. -Postop orthopedics to advise when to resume home regimen Coumadin.

## 2023-01-24 NOTE — Progress Notes (Signed)
Progress Note   Patient: Adam Long ZOX:[id number] DOB: Jun 02, 1976 DOA: 01/23/2023     1 DOS: the patient was seen and examined on 01/24/2023   Brief hospital course: Adam Long was admitted to the hospital with the working diagnosis of left leg stump infection.   47 yo male with the past medical history of coronary artery disease, hypertension, T2DM, dyslipidemia, sp aortic valve replacement, and obesity who presented with left lower extremity pain. Reported 5 days of pain at the level of his stump, he has not been able to use his prosthesis due to pain. Positive local erythema and bleeding ulcer. No fever or chills. On his initial physical examination his blood pressure was 129/67, HR 89, RR 18 and 02 saturation 97%, lungs with no wheezing or rales, heart with S1 and S2 present and rhythmic, abdomen with no distention and no lower extremity edema. Left stump with erythema, tender to palpation and a quarter size ulcer with serous drainage.   Na 129, K 3,5 Cl 96, bicarbonate 19, glucose 450, bun 18 cr 0,82  Wbc 10,6 hgb 10,8 plt 261   Left lower extremity CT with open wound along the lateral aspect of the distal stump with medial extension of ill defined collections of fluid and gas along the distal aspect of the stump, suspicious for soft tissue abscess. Largest component measures up to 4,1 x 2,6 cm.  No gross cortical destruction to suggest recurrent osteomyelitis. Generalized subcutaneous edema within the lower leg.  Left knee with degenerative changes with no significant joint effusion.   Patient was placed on IV antibiotic therapy.  Orthopedics was consulted with recommendations for I&D in the OR.   Assessment and Plan: * Cellulitis and abscess of left leg Left BKA with stump infection.  Patient has been afebrile, wbc is 9,2 Cultures have bee no growth. CT with signs of local abscess.  Prior infection with E coli ESBL.   Plan to continue antibiotic therapy with IV vancomycin and  meropenem.  Plan for I&D in 48 hrs in the OR.  Continue pain control with oral oxycodone and IV morphine.   Uncontrolled type 2 diabetes mellitus with hyperglycemia (HCC) Fasting glucose is 362 mg/dl,  Plan to continue glucose cover and monitoring with insulin sliding scale.  Add basal insulin with glargine 10 units and further adjustments depending on insulin requirements.   HTN (hypertension) Continue blood pressure control with metoprolol and lisinopril per his home regimen.   Hyperlipidemia associated with type 2 diabetes mellitus (HCC) Continue with ezetimibe.   S/P aortic valve replacement with mechanical valve INR is 1.3 Plan to continue to holding warfarin in preparation for left stump I&D.   Anxiety Continue with sertraline.   CAD (coronary artery disease) - Continue beta-blocker, ACE - No chest pain - Continue to monitor  GERD (gastroesophageal reflux disease) - Continue PPI  Class 3 obesity (HCC) Calculated BMI is 51.59        Subjective: Patient is feeling better, his left stump wound pain is controlled with analgesics, no nausea or vomiting.   Physical Exam: Vitals:   01/23/23 2131 01/24/23 0426 01/24/23 0500 01/24/23 0731  BP:  126/69  134/70  Pulse:  78  77  Resp:  16  18  Temp:  98 F (36.7 C)  98.1 F (36.7 C)  TempSrc:  Oral  Oral  SpO2: 97% 98%  98%  Weight:   (!) 163.1 kg   Height:   5\' 10"  (1.778 m)    Neurology  awake and alert ENT with no pallor Cardiovascular with S1 and S2 present and rhythmic with no gallops, rubs or murmurs Respiratory with no rales or wheezing, no rhonchi Abdomen with no distention. No lower extremity edema Left stump with dressing in place.  Data Reviewed:    Family Communication: no family at the bedside   Disposition: Status is: Inpatient Remains inpatient appropriate because: IV antibiotics and I&D.   Planned Discharge Destination: Home    Author: Coralie Keens, MD 01/24/2023 1:09  PM  For on call review [email or web address].

## 2023-01-24 NOTE — Assessment & Plan Note (Signed)
-   Continue beta-blocker, ACE - No chest pain - Continue to monitor

## 2023-01-24 NOTE — Assessment & Plan Note (Signed)
Calculated BMI is 51.59 -Lifestyle modification -Outpatient follow-up with PCP.

## 2023-01-24 NOTE — Plan of Care (Signed)
  Problem: Education: Goal: Ability to describe self-care measures that may prevent or decrease complications (Diabetes Survival Skills Education) will improve Outcome: Progressing   Problem: Coping: Goal: Ability to adjust to condition or change in health will improve Outcome: Progressing   Problem: Fluid Volume: Goal: Ability to maintain a balanced intake and output will improve Outcome: Progressing   Problem: Health Behavior/Discharge Planning: Goal: Ability to identify and utilize available resources and services will improve Outcome: Progressing Goal: Ability to manage health-related needs will improve Outcome: Progressing   Problem: Metabolic: Goal: Ability to maintain appropriate glucose levels will improve Outcome: Progressing   Problem: Nutritional: Goal: Maintenance of adequate nutrition will improve Outcome: Progressing Goal: Progress toward achieving an optimal weight will improve Outcome: Progressing   Problem: Skin Integrity: Goal: Risk for impaired skin integrity will decrease Outcome: Progressing   Problem: Tissue Perfusion: Goal: Adequacy of tissue perfusion will improve Outcome: Progressing   Problem: Education: Goal: Knowledge of General Education information will improve Description: Including pain rating scale, medication(s)/side effects and non-pharmacologic comfort measures Outcome: Progressing   Problem: Pain Managment: Goal: General experience of comfort will improve Outcome: Progressing

## 2023-01-24 NOTE — Assessment & Plan Note (Addendum)
Continue blood pressure control with metoprolol and lisinopril per his home regimen.

## 2023-01-24 NOTE — Consult Note (Addendum)
ORTHOPAEDIC CONSULTATION  REQUESTING PHYSICIAN: Coralie Keens,*  Chief Complaint: Left BKA ulceration  HPI: Adam Long is a 47 y.o. male who presents with left BKA ulceration for about a week.  Underwent left BKA 1 year ago.  Has been ambulating with a prosthetic and started having pain a week ago.  Started noticing drainage and ulceration on the lateral side of the BKA.  He has not been controlling his diabetes.  Takes coumadin for mechanical heart valve.    Past Medical History:  Diagnosis Date   Anxiety    Aortic stenosis    Cellulitis and abscess of lower extremity 06/11/2019   Cellulitis of fourth toe of left foot    Cholelithiasis    Coronary artery disease    Nonobstructive CAD (40-50% LAD) 08/2019   Depression    Diabetes mellitus without complication (HCC)    Phreesia 09/27/2020   Elevated troponin level not due myocardial infarction 11/11/2019   Essential hypertension    Gangrene of toe of left foot (HCC) 07/06/2019   Heart murmur    Phreesia 09/27/2020   Hyperlipidemia    Phreesia 09/27/2020   Hypertension    Phreesia 09/27/2020   Mixed hyperlipidemia    Morbid obesity (HCC)    S/P aortic valve replacement with mechanical valve 12/05/2019   25 mm Carbomedics top hat bileaflet mechanical valve via partial upper hemi-sternotomy   Severe aortic stenosis 09/24/2019   Type 2 diabetes mellitus (HCC)    Past Surgical History:  Procedure Laterality Date   ABDOMINAL AORTOGRAM W/LOWER EXTREMITY N/A 07/06/2019   Procedure: ABDOMINAL AORTOGRAM W/LOWER EXTREMITY;  Surgeon: Sherren Kerns, MD;  Location: [facility] INVASIVE CV LAB;  Service: Cardiovascular;  Laterality: N/A;  Bilateral   AMPUTATION Left 07/09/2019   Procedure: LEFT FOURTH and Fifth TOE AMPUTATION.;  Surgeon: Larina Earthly, MD;  Location: [facility] OR;  Service: Vascular;  Laterality: Left;   AMPUTATION Left 03/11/2021   Procedure: LEFT FOOT 5TH  AND 4TH RAY AMPUTATION;  Surgeon: Nadara Mustard, MD;   Location: [facility] OR;  Service: Orthopedics;  Laterality: Left;   AMPUTATION Left 08/28/2021   Procedure: AMPUTATION BELOW KNEE;  Surgeon: Nadara Mustard, MD;  Location: [facility] OR;  Service: Orthopedics;  Laterality: Left;   AORTIC VALVE REPLACEMENT N/A 12/05/2019   Procedure: PARTIAL STERNOTOMY FOR AORTIC VALVE REPLACEMENT (AVR), USING CARBOMEDICS SUPRA-ANNULAR TOP HAT ;  Surgeon: Purcell Nails, MD;  Location: [facility] OR;  Service: Open Heart Surgery;  Laterality: N/A;  No neck lines on left   APPLICATION OF WOUND VAC Left 10/30/2021   Procedure: APPLICATION OF WOUND VAC;  Surgeon: Nadara Mustard, MD;  Location: [facility] OR;  Service: Orthopedics;  Laterality: Left;   APPLICATION OF WOUND VAC Left 12/25/2021   Procedure: APPLICATION OF WOUND VAC;  Surgeon: Nadara Mustard, MD;  Location: [facility] OR;  Service: Orthopedics;  Laterality: Left;   CARDIAC VALVE REPLACEMENT N/A    Phreesia 09/27/2020   IR RADIOLOGY PERIPHERAL GUIDED IV START  10/05/2019   IR US GUIDE VASC ACCESS RIGHT  10/05/2019   MULTIPLE EXTRACTIONS WITH ALVEOLOPLASTY N/A 10/26/2019   Procedure: EXTRACTION OF TOOTH #'S 3, 5-11,19-28,  AND 32 WITH ALVEOLOPLASTY;  Surgeon: Charlynne Pander, DDS;  Location: [facility] OR;  Service: Oral Surgery;  Laterality: N/A;   RIGHT HEART CATH AND CORONARY ANGIOGRAPHY N/A 09/24/2019   Procedure: RIGHT HEART CATH AND CORONARY ANGIOGRAPHY;  Surgeon: Tonny Bollman, MD;  Location: [facility] INVASIVE CV LAB;  Service: Cardiovascular;  Laterality: N/A;   STUMP REVISION Left 10/30/2021   Procedure: REVISION LEFT BELOW KNEE AMPUTATION;  Surgeon: Nadara Mustard, MD;  Location: [facility] OR;  Service: Orthopedics;  Laterality: Left;   STUMP REVISION Left 12/25/2021   Procedure: REVISION LEFT BELOW KNEE AMPUTATION;  Surgeon: Nadara Mustard, MD;  Location: [facility] OR;  Service: Orthopedics;  Laterality: Left;   TEE WITHOUT CARDIOVERSION N/A 12/05/2019   Procedure: TRANSESOPHAGEAL ECHOCARDIOGRAM (TEE);  Surgeon: Purcell Nails, MD;  Location: [facility] OR;  Service:  Open Heart Surgery;  Laterality: N/A;   TEE WITHOUT CARDIOVERSION N/A 09/01/2021   Procedure: TRANSESOPHAGEAL ECHOCARDIOGRAM (TEE);  Surgeon: Little Ishikawa, MD;  Location: [facility] ENDOSCOPY;  Service: Cardiovascular;  Laterality: N/A;   Social History   Socioeconomic History   Marital status: Single    Spouse name: Not on file   Number of children: Not on file   Years of education: Not on file   Highest education level: Not on file  Occupational History    Comment: Glass blower/designer- self-employed  Tobacco Use   Smoking status: Never   Smokeless tobacco: Former    Types: Chew    Quit date: 2021  Vaping Use   Vaping Use: Never used  Substance and Sexual Activity   Alcohol use: Yes    Alcohol/week: 4.0 standard drinks of alcohol    Types: 4 Cans of beer per week    Comment: occasionally   Drug use: No   Sexual activity: Yes    Birth control/protection: None  Other Topics Concern   Not on file  Social History Narrative   Lives with his friend.    Social Determinants of Health   Financial Resource Strain: Not on file  Food Insecurity: No Food Insecurity (01/24/2023)   Hunger Vital Sign    Worried About Running Out of Food in the Last Year: Never true    Ran Out of Food in the Last Year: Never true  Transportation Needs: No Transportation Needs (01/24/2023)   PRAPARE - Administrator, Civil Service (Medical): No    Lack of Transportation (Non-Medical): No  Physical Activity: Not on file  Stress: Not on file  Social Connections: Not on file   Family History  Problem Relation Age of Onset   Cancer Mother        Brain tumor   Heart disease Father    Hyperlipidemia Father    Hypertension Father    Stroke Father    Heart murmur Sister    Heart attack Maternal Grandmother    Liver disease Maternal Grandfather    Breast cancer Paternal Grandmother    COPD Paternal Grandfather    Heart attack Maternal Aunt    Colon cancer Maternal Uncle 67    Pancreatic cancer Paternal Aunt    Heart disease Paternal Uncle    Prostate cancer Neg Hx    - negative except otherwise stated in the family history section Allergies  Allergen Reactions   Pollen Extract Other (See Comments)    Runny nose, watery eyes, sneezing   Prior to Admission medications   Medication Sig Start Date End Date Taking? Authorizing Provider  ezetimibe (ZETIA) 10 MG tablet Take 1 tablet (10 mg total) by mouth daily. 10/12/22  Yes Cook, Jayce G, DO  gabapentin (NEURONTIN) 300 MG capsule Take 2 capsules (600 mg total) by mouth 2 (two) times daily. 10/12/22  Yes Cook, Jayce G, DO  lisinopril (ZESTRIL) 20 MG tablet Take 1 tablet (20 mg  total) by mouth daily. 10/12/22  Yes Cook, Jayce G, DO  metFORMIN (GLUCOPHAGE) 500 MG tablet Take 2 tablets (1,000 mg total) by mouth 2 (two) times daily with a meal. 10/12/22  Yes Cook, Jayce G, DO  metoprolol tartrate (LOPRESSOR) 25 MG tablet Take 1 tablet (25 mg total) by mouth 2 (two) times daily. 10/12/22  Yes Cook, Jayce G, DO  pantoprazole (PROTONIX) 40 MG tablet Take 1 tablet (40 mg total) by mouth daily. 10/12/22  Yes Cook, Jayce G, DO  sertraline (ZOLOFT) 50 MG tablet Take 1 tablet (50 mg total) by mouth daily. 10/12/22  Yes Everlene Other G, DO  warfarin (COUMADIN) 10 MG tablet Take 1 tablet daily except 1/2 tablet on Mondays, Wednesdays and Fridays or as directed 02/10/22  Yes Jonelle Sidle, MD   CT TIBIA FIBULA LEFT W CONTRAST  Result Date: 01/23/2023 CLINICAL DATA:  Soft tissue mass, lower leg, deep. Patient reports a wound caused by his prosthetic limb with bleeding for several days. History of diabetes and osteomyelitis. EXAM: CT OF THE LOWER LEFT EXTREMITY WITH CONTRAST TECHNIQUE: Multidetector CT imaging of the left knee was performed according to the standard protocol following intravenous contrast administration. RADIATION DOSE REDUCTION: This exam was performed according to the departmental dose-optimization program which includes  automated exposure control, adjustment of the mA and/or kV according to patient size and/or use of iterative reconstruction technique. CONTRAST:  OMNIPAQUE IOHEXOL 300 MG/ML  SOLN COMPARISON:  No recent comparison imaging. Correlation is made with preoperative radiographs of the left foot 08/27/2021 and MRI of the left foot 02/12/2021. FINDINGS: Bones/Joint/Cartilage Patient is status post below the knee amputation. Along the amputation margins of the proximal tibial and fibular diaphyses, there is cortical thickening and periosteal bone formation. No gross cortical destruction to suggest recurrent osteomyelitis. There is no evidence of acute fracture or dislocation. Tricompartmental degenerative changes are present at the knee without significant joint effusion. Ligaments Suboptimally assessed by CT. Muscles and Tendons Generalized muscular atrophy. The visualized quadriceps and patellar tendons are intact. No focal intramuscular fluid collections are identified. Soft tissues There is an open wound along the lateral aspect of the distal stump with medial extension of ill-defined collections of fluid and gas along the distal aspect of the stump, suspicious for soft tissue abscesses. Largest component measures up to 4.1 x 2.6 cm on image 110/5. There is surrounding subcutaneous edema and overlying dermal thickening. No focal fluid collections are seen more proximally. There is generalized subcutaneous edema within the lower leg. Prominent vascular calcifications are noted. IMPRESSION: 1. Open wound along the lateral aspect of the distal stump with medial extension of ill-defined collections of fluid and gas along the distal aspect of the stump, suspicious for soft tissue abscesses. Largest component measures up to 4.1 x 2.6 cm. 2. No gross cortical destruction to suggest recurrent osteomyelitis. There is cortical thickening and periosteal bone formation along the amputation margins of the proximal tibial and  fibular diaphyses. 3. Generalized subcutaneous edema within the lower leg. 4. Left knee degenerative changes without significant joint effusion. Electronically Signed   By: Carey Bullocks M.D.   On: 01/23/2023 16:40   - pertinent xrays, CT, MRI studies were reviewed and independently interpreted  Positive ROS: All other systems have been reviewed and were otherwise negative with the exception of those mentioned in the HPI and as above.  Physical Exam: General: No acute distress Cardiovascular: No pedal edema Respiratory: No cyanosis, no use of accessory musculature GI: No organomegaly,  abdomen is soft and non-tender Skin: No lesions in the area of chief complaint Neurologic: Sensation intact distally Psychiatric: Patient is at baseline mood and affect Lymphatic: No axillary or cervical lymphadenopathy  MUSCULOSKELETAL:  Left BKA stump with a circular lateral ulcer with purulent drainage.  There is an area anteriorly that is fluctuant and appears to be on the verge of ulcerating.  Surrounding cellulitis.  Assessment: Left BKA stump ulceration and deep abscess Uncontrolled diabetes Coumadin for heart valve  Plan: - will need formal I&D in the OR.  I will place him on the schedule for wed.  Dr. Lajoyce Corners will likely perform definitive revision BKA. - hold coumadin, INR currently 1.3 which is acceptable for surgery - continue vanc/mero - counseled patient on importance of controlling diabetes - patient may have a diet  Thank you for the consult and the opportunity to see Adam Long. Glee Arvin, MD [facility] 9:23 AM

## 2023-01-24 NOTE — Assessment & Plan Note (Addendum)
Fasting glucose is 362 mg/dl,  Plan to continue glucose cover and monitoring with insulin sliding scale.  Add basal insulin with glargine 10 units and further adjustments depending on insulin requirements.

## 2023-01-24 NOTE — Consult Note (Signed)
WOC Nurse Consult Note: Reason for Consult: Request for topical care guidance for nursing with dressing orders for left BKA wound with surrounding erythema. Orthopedics has been simultaneously consulted. Dr. Lajoyce Corners performed amputation in 08/2021.  Patient last seen in the office on 11/17/22 by Gordy Clement, NP at which time no ulceration existed. Wound type:Full thickness, infectious Pressure Injury POA: N/A Measurement:To be measured by Bedside RN today with application of next dressing change Wound bed:Red, moist Drainage (amount, consistency, odor) serosanguinous, small amount Periwound:erythematous Dressing procedure/placement/frequency: Orthopedics is simultaneously consulted, so I have communicated with Dr. Ella Jubilee that we will provide conservative topical care guidance until that service has seen. Any orders for topical care they provide will supercede those of WOC Nursing. Topical care will be with a daily soap and water cleanse followed by placement of a xeroform (antimicrobial, nonadherent) gauze topped with dry gauze, and ABD and secured with Kerlix roll gauze/paper tape. The limb is to be placed on a pillow to offload the ulcer form the bed surface. A sacral foam is to be placed for PI prevention. A chair cushion is provided for use while OOB here in house and is to be sent home with the the patient for use in his chair post discharge.  WOC nursing team will not follow, but will remain available to this patient, the nursing and medical teams.  Please re-consult if needed.  Thank you for inviting [place] to participate in this patient's Plan of Care.  Ladona Mow, MSN, RN, CNS, GNP, Leda Min, [employer], [employer] phone:  [phone]

## 2023-01-24 NOTE — Assessment & Plan Note (Addendum)
Continue with sertraline  

## 2023-01-25 ENCOUNTER — Other Ambulatory Visit (HOSPITAL_COMMUNITY): Payer: Self-pay

## 2023-01-25 ENCOUNTER — Telehealth (HOSPITAL_COMMUNITY): Payer: Self-pay | Admitting: Pharmacy Technician

## 2023-01-25 DIAGNOSIS — L03116 Cellulitis of left lower limb: Secondary | ICD-10-CM | POA: Diagnosis not present

## 2023-01-25 DIAGNOSIS — E876 Hypokalemia: Secondary | ICD-10-CM | POA: Diagnosis not present

## 2023-01-25 DIAGNOSIS — I2510 Atherosclerotic heart disease of native coronary artery without angina pectoris: Secondary | ICD-10-CM | POA: Diagnosis not present

## 2023-01-25 DIAGNOSIS — E1169 Type 2 diabetes mellitus with other specified complication: Secondary | ICD-10-CM | POA: Diagnosis not present

## 2023-01-25 LAB — GLUCOSE, CAPILLARY
Glucose-Capillary: 333 mg/dL — ABNORMAL HIGH (ref 70–99)
Glucose-Capillary: 335 mg/dL — ABNORMAL HIGH (ref 70–99)
Glucose-Capillary: 255 mg/dL — ABNORMAL HIGH (ref 70–99)
Glucose-Capillary: 254 mg/dL — ABNORMAL HIGH (ref 70–99)

## 2023-01-25 LAB — BASIC METABOLIC PANEL
Anion gap: 7 (ref 5–15)
BUN: 13 mg/dL (ref 6–20)
CO2: 24 mmol/L (ref 22–32)
Calcium: 8.3 mg/dL — ABNORMAL LOW (ref 8.9–10.3)
Chloride: 99 mmol/L (ref 98–111)
Creatinine, Ser: 0.88 mg/dL (ref 0.61–1.24)
GFR, Estimated: >60 mL/min (ref >60)
Glucose, Bld: 336 mg/dL — ABNORMAL HIGH (ref 70–99)
Potassium: 4.6 mmol/L (ref 3.5–5.1)
Sodium: 130 mmol/L — ABNORMAL LOW (ref 135–145)

## 2023-01-25 LAB — CBC
HCT: 34.4 % — ABNORMAL LOW (ref 39.0–52.0)
Hemoglobin: 11.3 g/dL — ABNORMAL LOW (ref 13.0–17.0)
MCH: 28.6 pg (ref 26.0–34.0)
MCHC: 32.8 g/dL (ref 30.0–36.0)
MCV: 87.1 fL (ref 80.0–100.0)
Platelets: 255 10*3/uL (ref 150–400)
RBC: 3.95 MIL/uL — ABNORMAL LOW (ref 4.22–5.81)
RDW: 13.2 % (ref 11.5–15.5)
WBC: 8.7 10*3/uL (ref 4.0–10.5)
nRBC: 0 % (ref 0.0–0.2)

## 2023-01-25 LAB — VANCOMYCIN, PEAK: Vancomycin Pk: 28 ug/mL — ABNORMAL LOW (ref 30–40)

## 2023-01-25 LAB — VANCOMYCIN, TROUGH: Vancomycin Tr: 16 ug/mL (ref 15–20)

## 2023-01-25 LAB — PROTIME-INR
INR: 1.5 — ABNORMAL HIGH (ref 0.8–1.2)
Prothrombin Time: 18.1 seconds — ABNORMAL HIGH (ref 11.4–15.2)

## 2023-01-25 LAB — HEPARIN LEVEL (UNFRACTIONATED): Heparin Unfractionated: <0.1 IU/mL — ABNORMAL LOW (ref 0.30–0.70)

## 2023-01-25 MED ORDER — VANCOMYCIN HCL 1500 MG/300ML IV SOLN
1500.0000 mg | Freq: Two times a day (BID) | INTRAVENOUS | Status: DC
  Administered 2023-01-26 – 2023-02-01 (×13): 1500 mg via INTRAVENOUS
  Filled 2023-01-25 (×16): qty 300

## 2023-01-25 MED ORDER — INSULIN GLARGINE-YFGN 100 UNIT/ML ~~LOC~~ SOLN
30.0000 [IU] | Freq: Every day | SUBCUTANEOUS | Status: DC
  Administered 2023-01-25 – 2023-02-03 (×10): 30 [IU] via SUBCUTANEOUS
  Filled 2023-01-25 (×14): qty 0.3

## 2023-01-25 MED ORDER — HEPARIN (PORCINE) 25000 UT/250ML-% IV SOLN
2500.0000 [IU]/h | INTRAVENOUS | Status: DC
  Administered 2023-01-25: 2050 [IU]/h via INTRAVENOUS
  Administered 2023-01-25: 1700 [IU]/h via INTRAVENOUS
  Filled 2023-01-25 (×2): qty 250

## 2023-01-25 MED ORDER — HEPARIN BOLUS VIA INFUSION
6800.0000 [IU] | Freq: Once | INTRAVENOUS | Status: AC
  Administered 2023-01-25: 6800 [IU] via INTRAVENOUS
  Filled 2023-01-25: qty 6800

## 2023-01-25 NOTE — Progress Notes (Signed)
ANTICOAGULATION CONSULT NOTE - Follow Up Consult  Pharmacy Consult for Heparin Indication:  mechanical AVR  Allergies  Allergen Reactions   Pollen Extract Other (See Comments)    Runny nose, watery eyes, sneezing    Patient Measurements: Height: 5\' 10"  (177.8 cm) Weight: (!) 163.5 kg (360 lb 7.2 oz) IBW/kg (Calculated) : 73 Heparin Dosing Weight: 112.8 kg  Vital Signs: Temp: 98.3 F (36.8 C) (04/30 1648) Temp Source: Oral (04/30 1648) BP: 138/80 (04/30 1648) Pulse Rate: 80 (04/30 1648)  Labs: Recent Labs    01/23/23 1518 01/23/23 1529 01/24/23 0047 01/25/23 0336 01/25/23 1753  HGB 10.8*  --  10.5* 11.3*  --   HCT 33.3*  --  31.4* 34.4*  --   PLT 261  --  249 255  --   LABPROT  --  16.4*  --  18.1*  --   INR  --  1.3*  --  1.5*  --   HEPARINUNFRC  --   --   --   --  <0.10*  CREATININE 0.82  --  0.84 0.88  --     Estimated Creatinine Clearance: 162 mL/min (by C-G formula based on SCr of 0.88 mg/dL).  Assessment: 47 yo M who presents with cellulitis and abscess of L BKA stump and has been initiated on broad-spectrum antibiotics. Plan is for surgical I&D on 5/1 with orthopedic surgery. Patient has a mechanical AVR and was taking warfarin 10mg  T/R/Sa/Su and 5mg  on MWF. This regimen was last reviewed in outpt anticoag clinic on 4/9 with therapeutic INR of 2.5. Pt reports last dose of warfarin was ~1 wk prior to admission. INR on admission was 1.3, subtherapeutic. Warfarin was held on admission in anticipation for possible surgery. Pharmacy consulted to dose heparin infusion for mechanical AVR.    Hgb 11.3, Plt 255 - stable No s/sx of bleeding  Initial heparin level is subtherapeutic (< 0.10) on 1700 units/hr. No known infusion problems per RN.  Goal of Therapy:  Heparin level 0.3-0.7 units/ml Monitor platelets by anticoagulation protocol: Yes   Plan:  Increase heparin drip to 2050 units/hr. Heparin level ~6 hours after rate change. Daily heparin level and CBC while  on heparin. Monitor for signs/symptoms of bleeding. OR scheduled for 2pm on 5/1; will follow up timing of holding pre-op. Warfarin on hold.  Dennie Fetters, RPh 01/25/2023,7:56 PM

## 2023-01-25 NOTE — Telephone Encounter (Signed)
Pharmacy Patient Advocate Encounter  Insurance verification completed.    The patient is insured through [employer] [place] [employer]   The patient is currently admitted and ran test claims for the following: Lantus Pen, Novlolog Flex Pen, Dexcom G7 Sensors, [employer].  Copays and coinsurance results were relayed to Inpatient clinical team.

## 2023-01-25 NOTE — Plan of Care (Signed)

## 2023-01-25 NOTE — Progress Notes (Signed)
Pharmacy Antibiotic Note  Adam Long is a 47 y.o. male admitted on 01/23/2023 with  wound infection , L BKA stump. Pharmacy has been consulted for Vancomycin dosing.  Pt with a hx of chronic wound issue post BKA. He presented with wound issue and drainage. He is morbidly obese. His wound cultures in 2023 showed ESBL Klebsiella, Enterococcus, Clostridium perfringens, E coli, Proteus. He also had MSSA and group B strep bacteremia. Prior pharmacist d/w Dr. Carren Rang, and empiric Meropenem was added on 01/23/23.  Vanc levels today.  AUC 595 is above goal on Vanc 1250 mg IV q8h. Abundant Staph aureus in wound culture.  Sensitivities pending.  Plan: Adjust Vancomycin to 1500 mg IV q12h. Goal AUC 400-550. Expected AUC: 475 Also on Meropenem 1gm IV q8h. Follow renal function, culture data, clinical progress and antibiotic plans. For I&D on 01/26/23.    Height: 5\' 10"  (177.8 cm) Weight: (!) 163.5 kg (360 lb 7.2 oz) IBW/kg (Calculated) : 73  Temp (24hrs), Avg:98 F (36.7 C), Min:97.7 F (36.5 C), Max:98.3 F (36.8 C)  Recent Labs  Lab 01/23/23 1518 01/24/23 0047 01/25/23 0336 01/25/23 1243 01/25/23 1753  WBC 10.6* 9.2 8.7  --   --   CREATININE 0.82 0.84 0.88  --   --   VANCOTROUGH  --   --   --   --  16  VANCOPEAK  --   --   --  28*  --     Estimated Creatinine Clearance: 162 mL/min (by C-G formula based on SCr of 0.88 mg/dL).    Allergies  Allergen Reactions   Pollen Extract Other (See Comments)    Runny nose, watery eyes, sneezing    Antimicrobials this admission: Vancomycin 4/28 >> Meropenem 4/28 >>  Dose adjustments this admission: 4/30:  Vanc peak = 28, Vanc trough = 16 mcg/ml - on 1250 mg IV q8h. - AUC 595 > adjusted to 1500 mg IV q12h   Microbiology results: 4/28 wound (left leg): abundant Staph aureus - sensitivities pending  Thank you for allowing pharmacy to be a part of this patient's care.  Dennie Fetters, RPh 01/25/2023 7:25 PM

## 2023-01-25 NOTE — Progress Notes (Signed)
PROGRESS NOTE    Adam Long  ZOX:[id number] DOB: Feb 20, 1976 DOA: 01/23/2023 PCP: [name], DO    Chief Complaint  Patient presents with   Wound Check    Brief Narrative:  Mr. Ludington was admitted to the hospital with the working diagnosis of left leg stump infection.   47 yo male with the past medical history of coronary artery disease, hypertension, T2DM, dyslipidemia, sp aortic valve replacement, and obesity who presented with left lower extremity pain. Reported 5 days of pain at the level of his stump, he has not been able to use his prosthesis due to pain. Positive local erythema and bleeding ulcer. No fever or chills. On his initial physical examination his blood pressure was 129/67, HR 89, RR 18 and 02 saturation 97%, lungs with no wheezing or rales, heart with S1 and S2 present and rhythmic, abdomen with no distention and no lower extremity edema. Left stump with erythema, tender to palpation and a quarter size ulcer with serous drainage.   Na 129, K 3,5 Cl 96, bicarbonate 19, glucose 450, bun 18 cr 0,82  Wbc 10,6 hgb 10,8 plt 261   Left lower extremity CT with open wound along the lateral aspect of the distal stump with medial extension of ill defined collections of fluid and gas along the distal aspect of the stump, suspicious for soft tissue abscess. Largest component measures up to 4,1 x 2,6 cm.  No gross cortical destruction to suggest recurrent osteomyelitis. Generalized subcutaneous edema within the lower leg.  Left knee with degenerative changes with no significant joint effusion.   Patient was placed on IV antibiotic therapy.  Orthopedics was consulted with recommendations for I&D in the OR.     Assessment & Plan:  Principal Problem:   Cellulitis and abscess of left leg Active Problems:   Uncontrolled type 2 diabetes mellitus with hyperglycemia (HCC)   HTN (hypertension)   Hyperlipidemia associated with type 2 diabetes mellitus (HCC)   S/P aortic valve  replacement with mechanical valve   Anxiety   CAD (coronary artery disease)   GERD (gastroesophageal reflux disease)   Class 3 obesity (HCC)   Hypokalemia    Assessment and Plan: * Cellulitis and abscess of left leg Left BKA with stump infection.  -CT left lower extremity with local abscess formation noted. -Patient with prior infection of E. coli ESBL. -Currently afebrile. -Normal white count of 8.7 today. -Preliminary cultures with abundant Staph aureus. -Patient seen by orthopedics, and patient for I&D in the OR tomorrow 01/26/2023. -Continue empiric IV Merrem, IV vancomycin. -Continue current pain control with as needed oxycodone and IV morphine.  -Orthopedics following and appreciate input and recommendations.  Uncontrolled type 2 diabetes mellitus with hyperglycemia (HCC) -Hemoglobin A1c noted at 13.4.  -Glucose this morning at 333.  -Increase Semglee to 30 units daily.  -Continue SSI.  -Hold off on new coverage for now as patient to be n.p.o. tonight for procedure to be done tomorrow.  -Diabetes coordinator following.   HTN (hypertension) -Continue home regimen of lisinopril, Lopressor.   Hyperlipidemia associated with type 2 diabetes mellitus (HCC) -Continue Zetia.   S/P aortic valve replacement with mechanical valve INR is 1.5 Plan to continue to holding warfarin in preparation for left stump I&D.  -Place on IV heparin due to mechanical valve, orthopedics to advise when IV heparin needs to be held prior to procedure. -Postop orthopedics to advise when to resume home regimen Coumadin.  Anxiety -Sertraline.   CAD (coronary artery disease) -  Asymptomatic.  -Continue Zetia, lisinopril, Lopressor.  GERD (gastroesophageal reflux disease) - PPI.   Class 3 obesity (HCC) Calculated BMI is 51.59 -Lifestyle modification -Outpatient follow-up with PCP.  Hypokalemia Renal function with serum cr at 0,88, K is 4.6 and serum bicarbonate at 24. Na is 130., corrected  for hyperglycemia is 133. -Repeat labs in the AM.         DVT prophylaxis: Heparin drip Code Status: Full Family Communication: Updated patient, no family at bedside. Disposition: TBD  Status is: Inpatient Remains inpatient appropriate because: Severity of illness   Consultants:  Orthopedics: Dr.Xu 01/24/2023 Wound care RN: Ria Bush, RN 01/24/2023  Procedures:  CT left lower extremity 01/23/2023   Antimicrobials:  Anti-infectives (From admission, onward)    Start     Dose/Rate Route Frequency Ordered Stop   01/24/23 1000  vancomycin (VANCOREADY) IVPB 1250 mg/250 mL        1,250 mg 166.7 mL/hr over 90 Minutes Intravenous Every 8 hours 01/23/23 2140     01/24/23 0600  meropenem (MERREM) 1 g in sodium chloride 0.9 % 100 mL IVPB       See Hyperspace for full Linked Orders Report.   1 g 200 mL/hr over 30 Minutes Intravenous Every 8 hours 01/23/23 2151     01/23/23 2245  meropenem (MERREM) 2 g in sodium chloride 0.9 % 100 mL IVPB       See Hyperspace for full Linked Orders Report.   2 g 280 mL/hr over 30 Minutes Intravenous  Once 01/23/23 2151 01/24/23 0017   01/23/23 2230  vancomycin (VANCOCIN) IVPB 1000 mg/200 mL premix        1,000 mg 200 mL/hr over 60 Minutes Intravenous  Once 01/23/23 2140 01/23/23 2328   01/23/23 1515  vancomycin (VANCOCIN) IVPB 1000 mg/200 mL premix        1,000 mg 200 mL/hr over 60 Minutes Intravenous  Once 01/23/23 1510 01/23/23 1709         Subjective: Sitting up in chair.  No chest pain.  No shortness of breath.  No abdominal pain.  Overall feels well.  Awaiting I&D to be done tomorrow.  Objective: Vitals:   01/24/23 2151 01/25/23 0436 01/25/23 0439 01/25/23 0742  BP: (!) 142/77  (!) 142/71 (!) 149/78  Pulse: 78  68 70  Resp: 18  18 18   Temp: 97.7 F (36.5 C)  97.9 F (36.6 C) 98.1 F (36.7 C)  TempSrc: Oral  Oral Oral  SpO2: 98%  97% 96%  Weight:  (!) 163.5 kg    Height:        Intake/Output Summary (Last 24 hours) at  01/25/2023 1619 Last data filed at 01/25/2023 1417 Gross per 24 hour  Intake 700 ml  Output 1600 ml  Net -900 ml   Filed Weights   01/23/23 1445 01/24/23 0500 01/25/23 0436  Weight: (!) 167.8 kg (!) 163.1 kg (!) 163.5 kg    Examination:  General exam: Appears calm and comfortable  Respiratory system: Clear to auscultation.  No wheezes, no crackles, no rhonchi.  Fair air movement.  Speaking in full sentences.  Respiratory effort normal. Cardiovascular system: S1 & S2 heard, RRR.  Mechanical click.  No JVD, murmurs, rubs, gallops or clicks. No pedal edema. Gastrointestinal system: Abdomen is nondistended, soft and nontender. No organomegaly or masses felt. Normal bowel sounds heard. Central nervous system: Alert and oriented. No focal neurological deficits. Extremities: Status post left BKA with bandage over stump.   Skin: No rashes, lesions  or ulcers Psychiatry: Judgement and insight appear normal. Mood & affect appropriate.     Data Reviewed:   CBC: Recent Labs  Lab 01/23/23 1518 01/24/23 0047 01/25/23 0336  WBC 10.6* 9.2 8.7  NEUTROABS 7.8* 7.2  --   HGB 10.8* 10.5* 11.3*  HCT 33.3* 31.4* 34.4*  MCV 87.2 85.1 87.1  PLT 261 249 255    Basic Metabolic Panel: Recent Labs  Lab 01/23/23 1518 01/24/23 0047 01/25/23 0336  NA 128* 129* 130*  K 3.5 3.3* 4.6  CL 96* 97* 99  CO2 19* 24 24  GLUCOSE 450* 362* 336*  BUN 18 13 13   CREATININE 0.82 0.84 0.88  CALCIUM 8.4* 8.2* 8.3*  MG  --  1.9  --     GFR: Estimated Creatinine Clearance: 162 mL/min (by C-G formula based on SCr of 0.88 mg/dL).  Liver Function Tests: Recent Labs  Lab 01/24/23 0047  AST 25  ALT 25  ALKPHOS 57  BILITOT 0.8  PROT 6.3*  ALBUMIN 1.9*    CBG: Recent Labs  Lab 01/24/23 1203 01/24/23 1627 01/24/23 2147 01/25/23 0741 01/25/23 1209  GLUCAP 324* 275* 282* 333* 335*     Recent Results (from the past 240 hour(s))  Aerobic/Anaerobic Culture w Gram Stain (surgical/deep wound)      Status: None (Preliminary result)   Collection Time: 01/23/23  3:11 PM   Specimen: Wound  Result Value Ref Range Status   Specimen Description WOUND LEFT LEG  Final   Special Requests NONE  Final   Gram Stain   Final    FEW WBC PRESENT, PREDOMINANTLY MONONUCLEAR FEW GRAM POSITIVE COCCI IN CLUSTERS Performed at [facility], [address] [place], [place] [address]    Culture   Final    ABUNDANT STAPHYLOCOCCUS AUREUS SUSCEPTIBILITIES TO FOLLOW NO ANAEROBES ISOLATED; CULTURE IN PROGRESS FOR 5 DAYS    Report Status PENDING  Incomplete         Radiology Studies: CT TIBIA FIBULA LEFT W CONTRAST  Result Date: 01/23/2023 CLINICAL DATA:  Soft tissue mass, lower leg, deep. Patient reports a wound caused by his prosthetic limb with bleeding for several days. History of diabetes and osteomyelitis. EXAM: CT OF THE LOWER LEFT EXTREMITY WITH CONTRAST TECHNIQUE: Multidetector CT imaging of the left knee was performed according to the standard protocol following intravenous contrast administration. RADIATION DOSE REDUCTION: This exam was performed according to the departmental dose-optimization program which includes automated exposure control, adjustment of the mA and/or kV according to patient size and/or use of iterative reconstruction technique. CONTRAST:  OMNIPAQUE IOHEXOL 300 MG/ML  SOLN COMPARISON:  No recent comparison imaging. Correlation is made with preoperative radiographs of the left foot 08/27/2021 and MRI of the left foot 02/12/2021. FINDINGS: Bones/Joint/Cartilage Patient is status post below the knee amputation. Along the amputation margins of the proximal tibial and fibular diaphyses, there is cortical thickening and periosteal bone formation. No gross cortical destruction to suggest recurrent osteomyelitis. There is no evidence of acute fracture or dislocation. Tricompartmental degenerative changes are present at the knee without significant joint effusion. Ligaments  Suboptimally assessed by CT. Muscles and Tendons Generalized muscular atrophy. The visualized quadriceps and patellar tendons are intact. No focal intramuscular fluid collections are identified. Soft tissues There is an open wound along the lateral aspect of the distal stump with medial extension of ill-defined collections of fluid and gas along the distal aspect of the stump, suspicious for soft tissue abscesses. Largest component measures up to 4.1 x  2.6 cm on image 110/5. There is surrounding subcutaneous edema and overlying dermal thickening. No focal fluid collections are seen more proximally. There is generalized subcutaneous edema within the lower leg. Prominent vascular calcifications are noted. IMPRESSION: 1. Open wound along the lateral aspect of the distal stump with medial extension of ill-defined collections of fluid and gas along the distal aspect of the stump, suspicious for soft tissue abscesses. Largest component measures up to 4.1 x 2.6 cm. 2. No gross cortical destruction to suggest recurrent osteomyelitis. There is cortical thickening and periosteal bone formation along the amputation margins of the proximal tibial and fibular diaphyses. 3. Generalized subcutaneous edema within the lower leg. 4. Left knee degenerative changes without significant joint effusion. Electronically Signed   By: Carey Bullocks M.D.   On: 01/23/2023 16:40        Scheduled Meds:  ezetimibe  10 mg Oral Daily   gabapentin  600 mg Oral BID   insulin aspart  0-15 Units Subcutaneous TID WC   insulin aspart  0-5 Units Subcutaneous QHS   insulin glargine-yfgn  30 Units Subcutaneous Daily   lisinopril  20 mg Oral Daily   metoprolol tartrate  25 mg Oral BID   nystatin cream   Topical BID   pantoprazole  40 mg Oral Daily   sertraline  50 mg Oral Daily   Continuous Infusions:  heparin 1,700 Units/hr (01/25/23 1051)   meropenem (MERREM) IV 1 g (01/25/23 1541)   vancomycin Stopped (01/25/23 1052)     LOS: 2  days    Time spent: 45 minutes    Ramiro Harvest, MD [facility]   To contact the attending provider between 7A-7P or the covering provider during after hours 7P-7A, please log into the web site [email or web address] and access using universal [place] password for that web site. If you do not have the password, please call the hospital operator.  01/25/2023, 4:19 PM

## 2023-01-25 NOTE — TOC Benefit Eligibility Note (Signed)
Patient Product/process development scientist completed.    The patient is currently admitted and upon discharge could be taking Lantus Pen.  The current 30 day co-pay is $4.00.   The patient is currently admitted and upon discharge could be taking Novolog FlexPen.  The current 30 day co-pay is $4.00.   The patient is currently admitted and upon discharge could be taking Dexcom G7 Sensors.  Requires Prior Authorization  The patient is currently admitted and upon discharge could be taking Freestyle [employer].  Requires Prior Authorization  The patient is insured through [employer]   This test claim was processed through [facility]- copay amounts may vary at other pharmacies due to pharmacy/plan contracts, or as the patient moves through the different stages of their insurance plan.  Adam Long, CPHT Pharmacy Patient Advocate Specialist [facility] Patient Advocate Team Direct Number: [phone]  Fax: [phone]

## 2023-01-25 NOTE — Progress Notes (Signed)
ANTICOAGULATION CONSULT NOTE - Initial Consult  Pharmacy Consult for Heparin infusion Indication:  mechanical AVR  Allergies  Allergen Reactions   Pollen Extract Other (See Comments)    Runny nose, watery eyes, sneezing    Patient Measurements: Height: 5\' 10"  (177.8 cm) Weight: (!) 163.5 kg (360 lb 7.2 oz) IBW/kg (Calculated) : 73 Heparin Dosing Weight: 112.8 kg  Vital Signs: Temp: 98.1 F (36.7 C) (04/30 0742) Temp Source: Oral (04/30 0742) BP: 149/78 (04/30 0742) Pulse Rate: 70 (04/30 0742)  Labs: Recent Labs    01/23/23 1518 01/23/23 1529 01/24/23 0047 01/25/23 0336  HGB 10.8*  --  10.5* 11.3*  HCT 33.3*  --  31.4* 34.4*  PLT 261  --  249 255  LABPROT  --  16.4*  --  18.1*  INR  --  1.3*  --  1.5*  CREATININE 0.82  --  0.84 0.88    Estimated Creatinine Clearance: 162 mL/min (by C-G formula based on SCr of 0.88 mg/dL).   Medical History: Past Medical History:  Diagnosis Date   Anxiety    Aortic stenosis    Cellulitis and abscess of lower extremity 06/11/2019   Cellulitis of fourth toe of left foot    Cholelithiasis    Coronary artery disease    Nonobstructive CAD (40-50% LAD) 08/2019   Depression    Diabetes mellitus without complication (HCC)    Phreesia 09/27/2020   Elevated troponin level not due myocardial infarction 11/11/2019   Essential hypertension    Gangrene of toe of left foot (HCC) 07/06/2019   Heart murmur    Phreesia 09/27/2020   Hyperlipidemia    Phreesia 09/27/2020   Hypertension    Phreesia 09/27/2020   Mixed hyperlipidemia    Morbid obesity (HCC)    S/P aortic valve replacement with mechanical valve 12/05/2019   25 mm Carbomedics top hat bileaflet mechanical valve via partial upper hemi-sternotomy   Severe aortic stenosis 09/24/2019   Type 2 diabetes mellitus (HCC)     Medications:  Medications Prior to Admission  Medication Sig Dispense Refill Last Dose   ezetimibe (ZETIA) 10 MG tablet Take 1 tablet (10 mg total) by mouth  daily. 90 tablet 3 01/24/2023   gabapentin (NEURONTIN) 300 MG capsule Take 2 capsules (600 mg total) by mouth 2 (two) times daily. 360 capsule 3 01/24/2023   lisinopril (ZESTRIL) 20 MG tablet Take 1 tablet (20 mg total) by mouth daily. 90 tablet 3 01/24/2023   metFORMIN (GLUCOPHAGE) 500 MG tablet Take 2 tablets (1,000 mg total) by mouth 2 (two) times daily with a meal. 360 tablet 3 01/23/2023   metoprolol tartrate (LOPRESSOR) 25 MG tablet Take 1 tablet (25 mg total) by mouth 2 (two) times daily. 180 tablet 3 01/24/2023 at 0800   pantoprazole (PROTONIX) 40 MG tablet Take 1 tablet (40 mg total) by mouth daily. 90 tablet 3 01/24/2023   sertraline (ZOLOFT) 50 MG tablet Take 1 tablet (50 mg total) by mouth daily. 90 tablet 3 01/24/2023   warfarin (COUMADIN) 10 MG tablet Take 1 tablet daily except 1/2 tablet on Mondays, Wednesdays and Fridays or as directed 90 tablet 3 Past Week at 1800    Assessment: 47 yo M who presents with cellulitis and abscess of L BKA stump and has been initiated on broad-spectrum antibiotics. Plan is for surgical I&D on 5/1 with orthopedic surgery. Patient has a mechanical AVR and was taking warfarin 10mg  T/R/Sa/Su and 5mg  on MWF. This regimen was last reviewed in outpt anticoag clinic  on 4/9 with therapeutic INR of 2.5. Pt reports last dose of warfarin was ~1 wk prior to admission. INR on admission was 1.3, subtherapeutic. Warfarin was held on admission in anticipation for possible surgery. Pharmacy consulted to dose heparin infusion for mechanical AVR.   Hgb 11.3, Plt 255 - stable No s/sx of bleeding  Goal of Therapy:  Heparin level 0.3-0.7 units/ml Monitor platelets by anticoagulation protocol: Yes   Plan:  Heparin 6800 units IV x1 bolus, then Initiate Heparin infusion at 1700 units/hr Check heparin level in 6 hours Monitor daily CBC, heparin level, and for s/sx of bleeding F/u with orthopedic surgery on when to hold heparin prior to OR on 01/26/23   Wilburn Cornelia, PharmD,  BCPS Clinical Pharmacist 01/25/2023 10:00 AM   Please refer to AMION for pharmacy phone number

## 2023-01-25 NOTE — Progress Notes (Signed)
Subjective:   Procedure(s) (LRB): AMPUTATION BELOW KNEE (Left) Patient reports pain as mild.    Objective: Vital signs in last 24 hours: Temp:  [97.7 F (36.5 C)-98.4 F (36.9 C)] 97.9 F (36.6 C) (04/30 0439) Pulse Rate:  [68-79] 68 (04/30 0439) Resp:  [17-18] 18 (04/30 0439) BP: (124-142)/(71-77) 142/71 (04/30 0439) SpO2:  [97 %-98 %] 97 % (04/30 0439) Weight:  [163.5 kg] 163.5 kg (04/30 0436)  Intake/Output from previous day: 04/29 0701 - 04/30 0700 In: 240 [P.O.:240] Out: 1100 [Urine:1100] Intake/Output this shift: No intake/output data recorded.  Recent Labs    01/23/23 1518 01/24/23 0047 01/25/23 0336  HGB 10.8* 10.5* 11.3*   Recent Labs    01/24/23 0047 01/25/23 0336  WBC 9.2 8.7  RBC 3.69* 3.95*  HCT 31.4* 34.4*  PLT 249 255   Recent Labs    01/24/23 0047 01/25/23 0336  NA 129* 130*  K 3.3* 4.6  CL 97* 99  CO2 24 24  BUN 13 13  CREATININE 0.84 0.88  GLUCOSE 362* 336*  CALCIUM 8.2* 8.3*   Recent Labs    01/23/23 1529 01/25/23 0336  INR 1.3* 1.5*    Neurologically intact Ace bandage in place to left bka stump.  No drainage noted   Assessment/Plan:   Procedure(s) (LRB): AMPUTATION BELOW KNEE (Left) PLAN NWB LLE Plan to take to OR tomorrow for formal I&D left bka stump Continue to hold coumadin Continue with vancomycin/meropenem  NPO after midnight       Cristie Hem 01/25/2023, 7:31 AM

## 2023-01-25 NOTE — Progress Notes (Signed)
Inpatient Diabetes Program Recommendations  AACE/ADA: New Consensus Statement on Inpatient Glycemic Control (2015)  Target Ranges:  Prepandial:   less than 140 mg/dL      Peak postprandial:   less than 180 mg/dL (1-2 hours)      Critically ill patients:  140 - 180 mg/dL   Lab Results  Component Value Date   GLUCAP 335 (H) 01/25/2023   HGBA1C 13.4 (H) 01/24/2023    Review of Glycemic Control  Latest Reference Range & Units 01/25/23 07:41 01/25/23 12:09  Glucose-Capillary 70 - 99 mg/dL 409 (H) 811 (H)   Diabetes history: DM  Outpatient Diabetes medications:  Metformin 1000 mg bid Current orders for Inpatient glycemic control:  Novolog moderate tid with meals and HS Semglee 30 units daily  Inpatient Diabetes Program Recommendations:    Left pamphlet for patient regarding Freestyle [employer] for CGM.  He is definitely interested for d/c.  Will ask Pharmacy to do pre-auth if okay with MD.  Patient is okay with going back on insulin at d/c.  He is NPO after MN for surgery.  Will follow up on 5/2 regarding CGM.    Thanks,  Beryl Meager, RN, BC-ADM Inpatient Diabetes Coordinator Pager [phone]  (8a-5p)

## 2023-01-26 ENCOUNTER — Other Ambulatory Visit (HOSPITAL_COMMUNITY): Payer: Self-pay

## 2023-01-26 ENCOUNTER — Encounter (HOSPITAL_COMMUNITY)
Admission: EM | Disposition: A | Discharge status: Home / Self Care | Payer: Self-pay | Source: Home / Self Care | Attending: Internal Medicine

## 2023-01-26 ENCOUNTER — Other Ambulatory Visit: Payer: Self-pay

## 2023-01-26 ENCOUNTER — Telehealth (HOSPITAL_COMMUNITY): Payer: Self-pay | Admitting: Pharmacy Technician

## 2023-01-26 ENCOUNTER — Encounter (HOSPITAL_COMMUNITY): Payer: Self-pay | Admitting: Family Medicine

## 2023-01-26 ENCOUNTER — Inpatient Hospital Stay (HOSPITAL_COMMUNITY): Payer: Medicaid Other | Admitting: Anesthesiology

## 2023-01-26 DIAGNOSIS — I2510 Atherosclerotic heart disease of native coronary artery without angina pectoris: Secondary | ICD-10-CM | POA: Diagnosis not present

## 2023-01-26 DIAGNOSIS — E1165 Type 2 diabetes mellitus with hyperglycemia: Secondary | ICD-10-CM

## 2023-01-26 DIAGNOSIS — T8789 Other complications of amputation stump: Secondary | ICD-10-CM | POA: Diagnosis not present

## 2023-01-26 DIAGNOSIS — Z7984 Long term (current) use of oral hypoglycemic drugs: Secondary | ICD-10-CM

## 2023-01-26 DIAGNOSIS — L03116 Cellulitis of left lower limb: Secondary | ICD-10-CM | POA: Diagnosis not present

## 2023-01-26 DIAGNOSIS — L02416 Cutaneous abscess of left lower limb: Secondary | ICD-10-CM | POA: Diagnosis not present

## 2023-01-26 DIAGNOSIS — I10 Essential (primary) hypertension: Secondary | ICD-10-CM

## 2023-01-26 DIAGNOSIS — F418 Other specified anxiety disorders: Secondary | ICD-10-CM

## 2023-01-26 HISTORY — PX: I & D EXTREMITY: SHX5045

## 2023-01-26 HISTORY — PX: APPLICATION OF WOUND VAC: SHX5189

## 2023-01-26 LAB — CBC
HCT: 35.2 % — ABNORMAL LOW (ref 39.0–52.0)
Hemoglobin: 11 g/dL — ABNORMAL LOW (ref 13.0–17.0)
MCH: 27.8 pg (ref 26.0–34.0)
MCHC: 31.3 g/dL (ref 30.0–36.0)
MCV: 89.1 fL (ref 80.0–100.0)
Platelets: 285 10*3/uL (ref 150–400)
RBC: 3.95 MIL/uL — ABNORMAL LOW (ref 4.22–5.81)
RDW: 13.2 % (ref 11.5–15.5)
WBC: 8.6 10*3/uL (ref 4.0–10.5)
nRBC: 0.3 % — ABNORMAL HIGH (ref 0.0–0.2)

## 2023-01-26 LAB — RENAL FUNCTION PANEL
Albumin: 1.9 g/dL — ABNORMAL LOW (ref 3.5–5.0)
Anion gap: 6 (ref 5–15)
BUN: 10 mg/dL (ref 6–20)
CO2: 25 mmol/L (ref 22–32)
Calcium: 8.2 mg/dL — ABNORMAL LOW (ref 8.9–10.3)
Chloride: 99 mmol/L (ref 98–111)
Creatinine, Ser: 0.84 mg/dL (ref 0.61–1.24)
GFR, Estimated: >60 mL/min (ref >60)
Glucose, Bld: 306 mg/dL — ABNORMAL HIGH (ref 70–99)
Phosphorus: 3.5 mg/dL (ref 2.5–4.6)
Potassium: 4.3 mmol/L (ref 3.5–5.1)
Sodium: 130 mmol/L — ABNORMAL LOW (ref 135–145)

## 2023-01-26 LAB — AEROBIC/ANAEROBIC CULTURE W GRAM STAIN (SURGICAL/DEEP WOUND)

## 2023-01-26 LAB — GLUCOSE, CAPILLARY
Glucose-Capillary: 303 mg/dL — ABNORMAL HIGH (ref 70–99)
Glucose-Capillary: 232 mg/dL — ABNORMAL HIGH (ref 70–99)
Glucose-Capillary: 186 mg/dL — ABNORMAL HIGH (ref 70–99)
Glucose-Capillary: 161 mg/dL — ABNORMAL HIGH (ref 70–99)
Glucose-Capillary: 160 mg/dL — ABNORMAL HIGH (ref 70–99)
Glucose-Capillary: 189 mg/dL — ABNORMAL HIGH (ref 70–99)

## 2023-01-26 LAB — HEPARIN LEVEL (UNFRACTIONATED): Heparin Unfractionated: <0.1 IU/mL — ABNORMAL LOW (ref 0.30–0.70)

## 2023-01-26 LAB — MAGNESIUM: Magnesium: 1.9 mg/dL (ref 1.7–2.4)

## 2023-01-26 SURGERY — IRRIGATION AND DEBRIDEMENT EXTREMITY
Anesthesia: General | Site: Leg Lower | Laterality: Left

## 2023-01-26 MED ORDER — METHOCARBAMOL 500 MG PO TABS
500.0000 mg | ORAL_TABLET | Freq: Four times a day (QID) | ORAL | Status: DC | PRN
  Administered 2023-01-29 – 2023-01-30 (×2): 500 mg via ORAL
  Filled 2023-01-26 (×2): qty 1

## 2023-01-26 MED ORDER — METHOCARBAMOL 1000 MG/10ML IJ SOLN: 500.0000 mg | Freq: Four times a day (QID) | INTRAVENOUS | Status: DC | PRN

## 2023-01-26 MED ORDER — MAGNESIUM CITRATE PO SOLN: 1.0000 | Freq: Once | ORAL | Status: DC | PRN

## 2023-01-26 MED ORDER — OXYCODONE HCL 5 MG PO TABS
10.0000 mg | ORAL_TABLET | ORAL | Status: DC | PRN
  Administered 2023-01-26: 10 mg via ORAL
  Administered 2023-01-28: 15 mg via ORAL
  Filled 2023-01-26: qty 3

## 2023-01-26 MED ORDER — ACETAMINOPHEN 500 MG PO TABS
1000.0000 mg | ORAL_TABLET | Freq: Four times a day (QID) | ORAL | Status: AC
  Administered 2023-01-26 – 2023-01-27 (×4): 1000 mg via ORAL
  Filled 2023-01-26 (×4): qty 2

## 2023-01-26 MED ORDER — ORAL CARE MOUTH RINSE: 15.0000 mL | Freq: Once | OROMUCOSAL | Status: AC

## 2023-01-26 MED ORDER — CHLORHEXIDINE GLUCONATE 0.12 % MT SOLN
OROMUCOSAL | Status: AC
  Administered 2023-01-26: 15 mL via OROMUCOSAL
  Filled 2023-01-26: qty 15

## 2023-01-26 MED ORDER — SODIUM CHLORIDE 0.9 % IV SOLN: INTRAVENOUS | Status: DC

## 2023-01-26 MED ORDER — MIDAZOLAM HCL 2 MG/2ML IJ SOLN
INTRAMUSCULAR | Status: AC
  Filled 2023-01-26: qty 2

## 2023-01-26 MED ORDER — INSULIN ASPART 100 UNIT/ML IJ SOLN
0.0000 [IU] | INTRAMUSCULAR | Status: DC | PRN
  Administered 2023-01-26: 4 [IU] via SUBCUTANEOUS

## 2023-01-26 MED ORDER — SORBITOL 70 % SOLN: 30.0000 mL | Freq: Every day | Status: DC | PRN

## 2023-01-26 MED ORDER — CHLORHEXIDINE GLUCONATE 0.12 % MT SOLN: 15.0000 mL | Freq: Once | OROMUCOSAL | Status: AC

## 2023-01-26 MED ORDER — HYDROMORPHONE HCL 1 MG/ML IJ SOLN
0.2500 mg | INTRAMUSCULAR | Status: DC | PRN
  Administered 2023-01-26 (×2): 0.5 mg via INTRAVENOUS

## 2023-01-26 MED ORDER — OXYCODONE HCL 5 MG PO TABS
5.0000 mg | ORAL_TABLET | ORAL | Status: DC | PRN
  Administered 2023-01-29: 5 mg via ORAL
  Filled 2023-01-26 (×2): qty 2

## 2023-01-26 MED ORDER — ACETAMINOPHEN 500 MG PO TABS
1000.0000 mg | ORAL_TABLET | Freq: Once | ORAL | Status: AC
  Administered 2023-01-26: 1000 mg via ORAL
  Filled 2023-01-26: qty 2

## 2023-01-26 MED ORDER — TRANEXAMIC ACID-NACL 1000-0.7 MG/100ML-% IV SOLN
1000.0000 mg | Freq: Once | INTRAVENOUS | Status: AC
  Administered 2023-01-26: 1000 mg via INTRAVENOUS
  Filled 2023-01-26: qty 100

## 2023-01-26 MED ORDER — OXYCODONE HCL 5 MG PO TABS: 5.0000 mg | ORAL_TABLET | Freq: Once | ORAL | Status: DC | PRN

## 2023-01-26 MED ORDER — METOCLOPRAMIDE HCL 5 MG PO TABS: 5.0000 mg | ORAL_TABLET | Freq: Three times a day (TID) | ORAL | Status: DC | PRN

## 2023-01-26 MED ORDER — HYDROMORPHONE HCL 1 MG/ML IJ SOLN
INTRAMUSCULAR | Status: AC
  Filled 2023-01-26: qty 1

## 2023-01-26 MED ORDER — POLYETHYLENE GLYCOL 3350 17 G PO PACK: 17.0000 g | PACK | Freq: Every day | ORAL | Status: DC | PRN

## 2023-01-26 MED ORDER — SODIUM CHLORIDE 0.9 % IR SOLN
Status: DC | PRN
  Administered 2023-01-26: 3000 mL

## 2023-01-26 MED ORDER — ONDANSETRON HCL 4 MG/2ML IJ SOLN: 4.0000 mg | Freq: Once | INTRAMUSCULAR | Status: DC | PRN

## 2023-01-26 MED ORDER — FENTANYL CITRATE (PF) 250 MCG/5ML IJ SOLN
INTRAMUSCULAR | Status: DC | PRN
  Administered 2023-01-26: 50 ug via INTRAVENOUS

## 2023-01-26 MED ORDER — PHENYLEPHRINE HCL-NACL 20-0.9 MG/250ML-% IV SOLN
INTRAVENOUS | Status: DC | PRN
  Administered 2023-01-26: 25 ug/min via INTRAVENOUS

## 2023-01-26 MED ORDER — OXYCODONE HCL 5 MG/5ML PO SOLN: 5.0000 mg | Freq: Once | ORAL | Status: DC | PRN

## 2023-01-26 MED ORDER — ONDANSETRON HCL 4 MG PO TABS: 4.0000 mg | ORAL_TABLET | Freq: Four times a day (QID) | ORAL | Status: DC | PRN

## 2023-01-26 MED ORDER — 0.9 % SODIUM CHLORIDE (POUR BTL) OPTIME
TOPICAL | Status: DC | PRN
  Administered 2023-01-26: 1000 mL

## 2023-01-26 MED ORDER — INSULIN ASPART 100 UNIT/ML IJ SOLN
INTRAMUSCULAR | Status: AC
  Filled 2023-01-26: qty 1

## 2023-01-26 MED ORDER — ONDANSETRON HCL 4 MG/2ML IJ SOLN: 4.0000 mg | Freq: Four times a day (QID) | INTRAMUSCULAR | Status: DC | PRN

## 2023-01-26 MED ORDER — HEPARIN BOLUS VIA INFUSION
4000.0000 [IU] | Freq: Once | INTRAVENOUS | Status: AC
  Administered 2023-01-26: 4000 [IU] via INTRAVENOUS
  Filled 2023-01-26: qty 4000

## 2023-01-26 MED ORDER — HEPARIN (PORCINE) 25000 UT/250ML-% IV SOLN
2900.0000 [IU]/h | INTRAVENOUS | Status: DC
  Administered 2023-01-27 (×2): 2500 [IU]/h via INTRAVENOUS
  Filled 2023-01-26 (×2): qty 250

## 2023-01-26 MED ORDER — FENTANYL CITRATE (PF) 250 MCG/5ML IJ SOLN
INTRAMUSCULAR | Status: AC
  Filled 2023-01-26: qty 5

## 2023-01-26 MED ORDER — DOCUSATE SODIUM 100 MG PO CAPS
100.0000 mg | ORAL_CAPSULE | Freq: Two times a day (BID) | ORAL | Status: DC
  Administered 2023-01-26 – 2023-02-02 (×8): 100 mg via ORAL
  Filled 2023-01-26 (×10): qty 1

## 2023-01-26 MED ORDER — ACETAMINOPHEN 325 MG PO TABS: 325.0000 mg | ORAL_TABLET | Freq: Four times a day (QID) | ORAL | Status: DC | PRN

## 2023-01-26 MED ORDER — INSULIN ASPART 100 UNIT/ML IJ SOLN
0.0000 [IU] | INTRAMUSCULAR | Status: DC
  Administered 2023-01-26 (×2): 3 [IU] via SUBCUTANEOUS
  Administered 2023-01-26: 5 [IU] via SUBCUTANEOUS
  Administered 2023-01-26: 11 [IU] via SUBCUTANEOUS
  Administered 2023-01-27: 3 [IU] via SUBCUTANEOUS
  Administered 2023-01-27: 5 [IU] via SUBCUTANEOUS
  Administered 2023-01-27: 3 [IU] via SUBCUTANEOUS
  Administered 2023-01-27: 5 [IU] via SUBCUTANEOUS
  Administered 2023-01-27: 3 [IU] via SUBCUTANEOUS
  Administered 2023-01-27: 5 [IU] via SUBCUTANEOUS
  Administered 2023-01-28: 2 [IU] via SUBCUTANEOUS
  Administered 2023-01-28: 3 [IU] via SUBCUTANEOUS
  Administered 2023-01-28: 2 [IU] via SUBCUTANEOUS
  Administered 2023-01-28 (×2): 3 [IU] via SUBCUTANEOUS
  Administered 2023-01-29 (×2): 2 [IU] via SUBCUTANEOUS

## 2023-01-26 MED ORDER — LACTATED RINGERS IV SOLN: INTRAVENOUS | Status: DC

## 2023-01-26 MED ORDER — PROPOFOL 10 MG/ML IV BOLUS
INTRAVENOUS | Status: DC | PRN
  Administered 2023-01-26: 200 mg via INTRAVENOUS

## 2023-01-26 MED ORDER — LIDOCAINE 2% (20 MG/ML) 5 ML SYRINGE
INTRAMUSCULAR | Status: DC | PRN
  Administered 2023-01-26: 80 mg via INTRAVENOUS

## 2023-01-26 MED ORDER — DIPHENHYDRAMINE HCL 12.5 MG/5ML PO ELIX: 25.0000 mg | ORAL_SOLUTION | ORAL | Status: DC | PRN

## 2023-01-26 MED ORDER — METOCLOPRAMIDE HCL 5 MG/ML IJ SOLN: 5.0000 mg | Freq: Three times a day (TID) | INTRAMUSCULAR | Status: DC | PRN

## 2023-01-26 MED ORDER — HYDROMORPHONE HCL 1 MG/ML IJ SOLN: 0.5000 mg | INTRAMUSCULAR | Status: DC | PRN

## 2023-01-26 SURGICAL SUPPLY — 47 items
BAG COUNTER SPONGE SURGICOUNT (BAG) ×1 IMPLANT
BAG SPNG CNTER NS LX DISP (BAG)
CANISTER WOUND CARE 500ML ATS (WOUND CARE) IMPLANT
COVER SURGICAL LIGHT HANDLE (MISCELLANEOUS) ×1 IMPLANT
CUFF TOURN SGL QUICK 42 (TOURNIQUET CUFF) IMPLANT
DRAPE DERMATAC (DRAPES) IMPLANT
DRAPE INCISE IOBAN 66X45 STRL (DRAPES) IMPLANT
DRAPE U-SHAPE 47X51 STRL (DRAPES) ×1 IMPLANT
DRSG VAC GRANUFOAM SM (GAUZE/BANDAGES/DRESSINGS) IMPLANT
DURAPREP 26ML APPLICATOR (WOUND CARE) ×1 IMPLANT
ELECT REM PT RETURN 9FT ADLT (ELECTROSURGICAL)
ELECTRODE REM PT RTRN 9FT ADLT (ELECTROSURGICAL) IMPLANT
GAUZE PAD ABD 8X10 STRL (GAUZE/BANDAGES/DRESSINGS) ×1 IMPLANT
GAUZE SPONGE 4X4 12PLY STRL (GAUZE/BANDAGES/DRESSINGS) ×2 IMPLANT
GAUZE XEROFORM 5X9 LF (GAUZE/BANDAGES/DRESSINGS) ×1 IMPLANT
GLOVE BIOGEL PI IND STRL 7.0 (GLOVE) ×2 IMPLANT
GLOVE BIOGEL PI IND STRL 7.5 (GLOVE) ×1 IMPLANT
GLOVE ECLIPSE 7.0 STRL STRAW (GLOVE) ×1 IMPLANT
GLOVE SKINSENSE STRL SZ7.5 (GLOVE) ×2 IMPLANT
GLOVE SURG SYN 7.5  E (GLOVE) ×2
GLOVE SURG SYN 7.5 E (GLOVE) ×2 IMPLANT
GLOVE SURG SYN 7.5 PF PI (GLOVE) ×2 IMPLANT
GLOVE SURG UNDER POLY LF SZ7 (GLOVE) ×19 IMPLANT
GLOVE SURG UNDER POLY LF SZ7.5 (GLOVE) ×4 IMPLANT
GOWN STRL SURGICAL XL XLNG (GOWN DISPOSABLE) ×2 IMPLANT
HANDPIECE INTERPULSE COAX TIP (DISPOSABLE)
KIT BASIN OR (CUSTOM PROCEDURE TRAY) ×1 IMPLANT
KIT TURNOVER KIT B (KITS) ×1 IMPLANT
MANIFOLD NEPTUNE II (INSTRUMENTS) ×1 IMPLANT
PACK ORTHO EXTREMITY (CUSTOM PROCEDURE TRAY) ×1 IMPLANT
PAD ARMBOARD 7.5X6 YLW CONV (MISCELLANEOUS) ×2 IMPLANT
PADDING CAST ABS COTTON 4X4 ST (CAST SUPPLIES) ×1 IMPLANT
PADDING CAST COTTON 6X4 STRL (CAST SUPPLIES) ×1 IMPLANT
SET HNDPC FAN SPRY TIP SCT (DISPOSABLE) IMPLANT
SPONGE T-LAP 18X18 ~~LOC~~+RFID (SPONGE) ×1 IMPLANT
STOCKINETTE IMPERVIOUS 9X36 MD (GAUZE/BANDAGES/DRESSINGS) ×1 IMPLANT
SUT ETHILON 2 0 FS 18 (SUTURE) ×1 IMPLANT
SUT ETHILON 2 0 PSLX (SUTURE) IMPLANT
SUT VIC AB 2-0 FS1 27 (SUTURE) ×2 IMPLANT
SWAB COLLECTION DEVICE MRSA (MISCELLANEOUS) IMPLANT
SWAB CULTURE ESWAB REG 1ML (MISCELLANEOUS) IMPLANT
TOWEL GREEN STERILE (TOWEL DISPOSABLE) ×1 IMPLANT
TOWEL GREEN STERILE FF (TOWEL DISPOSABLE) ×1 IMPLANT
TUBE CONNECTING 12X1/4 (SUCTIONS) ×1 IMPLANT
UNDERPAD 30X36 HEAVY ABSORB (UNDERPADS AND DIAPERS) ×2 IMPLANT
WATER STERILE IRR 1000ML POUR (IV SOLUTION) ×1 IMPLANT
YANKAUER SUCT BULB TIP NO VENT (SUCTIONS) ×1 IMPLANT

## 2023-01-26 NOTE — Transfer of Care (Signed)
Immediate Anesthesia Transfer of Care Note  Patient: Adam Long  Procedure(s) Performed: IRRIGATION AND DEBRIDEMENT OF LEFT BKA STUMP (Left: Leg Lower) APPLICATION OF WOUND VAC TO LEFT STUMP (Left: Leg Lower)  Patient Location: PACU  Anesthesia Type:General  Level of Consciousness: awake, alert , and oriented  Airway & Oxygen Therapy: Patient Spontanous Breathing  Post-op Assessment: Report given to RN, Post -op Vital signs reviewed and stable, and Patient moving all extremities X 4  Post vital signs: Reviewed and stable  Last Vitals:  Vitals Value Taken Time  BP 118/74 01/26/23 1517  Temp    Pulse 80 01/26/23 1519  Resp 17 01/26/23 1519  SpO2 93 % 01/26/23 1519  Vitals shown include unvalidated device data.  Last Pain:  Vitals:   01/26/23 1313  TempSrc: Oral  PainSc: 3       Patients Stated Pain Goal: 0 (01/25/23 1432)  Complications: No notable events documented.

## 2023-01-26 NOTE — Progress Notes (Signed)
ANTICOAGULATION CONSULT NOTE - Follow Up Consult  Pharmacy Consult for heparin Indication:  mechanical AVR  No Known Allergies  Patient Measurements: Height: 5\' 10"  (177.8 cm) Weight: (!) 163.3 kg (360 lb 0.2 oz) IBW/kg (Calculated) : 73 Heparin Dosing Weight: 112.8 kg  Vital Signs: Temp: 98.3 F (36.8 C) (05/01 1615) Temp Source: Oral (05/01 1615) BP: 119/80 (05/01 1615) Pulse Rate: 75 (05/01 1615)  Labs: Recent Labs    01/24/23 0047 01/25/23 0336 01/25/23 1753 01/26/23 0522  HGB 10.5* 11.3*  --  11.0*  HCT 31.4* 34.4*  --  35.2*  PLT 249 255  --  285  LABPROT  --  18.1*  --   --   INR  --  1.5*  --   --   HEPARINUNFRC  --   --  <0.10* <0.10*  CREATININE 0.84 0.88  --  0.84     Estimated Creatinine Clearance: 169.6 mL/min (by C-G formula based on SCr of 0.84 mg/dL).  Assessment: 47 yo M who presents with cellulitis and abscess of L BKA stump and has been initiated on broad-spectrum antibiotics. Plan is for surgical I&D on 5/1 with orthopedic surgery. Patient has a mechanical AVR and was taking warfarin 10mg  T/R/Sa/Su and 5mg  on MWF. This regimen was last reviewed in outpt anticoag clinic on 4/9 with therapeutic INR of 2.5. Pt reports last dose of warfarin was ~1 wk prior to admission. INR on admission was 1.3, subtherapeutic. Warfarin was held on admission in anticipation for possible surgery. Pharmacy consulted to dose heparin infusion for mechanical AVR.   Patient is s/p I&D - per op note, heparin can be resumed 12h post-op. AET 15:20. Plan is for repeat OR for washout and closure vs amputation revision.    Goal of Therapy:  Heparin level 0.3-0.7 units/ml Monitor platelets by anticoagulation protocol: Yes   Plan:  Resume heparin drip at 2500 units/hr on 5/2 at 03:30 8h heparin level and CBC Daily heparin level and CBC while on heparin Monitor for signs/symptoms of bleeding F/U plan to return to OR  Thank you for involving pharmacy in this patient's  care.  Loura Back, PharmD, BCPS Clinical Pharmacist Clinical phone for 01/26/2023 is [phone] 01/26/2023 4:19 PM

## 2023-01-26 NOTE — Progress Notes (Signed)
Report called to short stay [phone]. Spoke with nurse Colon Branch who verbalized understanding of report. All undergarments removed. Consent signed and in chart. CHG done.

## 2023-01-26 NOTE — Op Note (Addendum)
   Date of Surgery: 01/26/2023  INDICATIONS: Adam Long is a 47 y.o.-year-old male with a left BKA stump abscess.  The patient did consent to the procedure after discussion of the risks and benefits.  PREOPERATIVE DIAGNOSIS: Left BKA stump abscess and ulceration  POSTOPERATIVE DIAGNOSIS: Same.  PROCEDURE:  Irrigation and excisional debridement of skin, subcutaneous tissue, muscle, fascia, bone 100 cm Application of wound VAC greater than 50 cm  Debridement type: Excisional Debridement  Side: left  Body Location: BKA stump  Tools used for debridement: scalpel, curette, and rongeur  Debridement depth beyond dead/damaged tissue down to healthy viable tissue: yes  Tissue layer involved: skin, subcutaneous tissue, muscle / fascia, bone  Nature of tissue removed: Devitalized Tissue and Purulence  Irrigation volume: 3 L     Irrigation fluid type: Normal Saline   SURGEON: N. Glee Arvin, M.D.  ASSIST: Starlyn Skeans [place], [place]; necessary for the timely completion of procedure and due to complexity of procedure.  ANESTHESIA:  general  IV FLUIDS AND URINE: See anesthesia.  ESTIMATED BLOOD LOSS: 50 mL.  IMPLANTS: None  DRAINS: Wound VAC  COMPLICATIONS: see description of procedure.  DESCRIPTION OF PROCEDURE: The patient was brought to the operating room.  The patient had been signed prior to the procedure and this was documented. The patient had the anesthesia placed by the anesthesiologist.  A time-out was performed to confirm that this was the correct patient, site, side and location.  A tourniquet was placed on the upper thigh.  The patient had the operative extremity prepped and draped in the standard surgical fashion.    Cultures were first obtained through the open wound.  I could feel that the wound tunneled all the way down to the distal tibia.  After cultures I then used a 10 blade to sharply excise the necrotic and ulcerated skin.  There were 2 ulcerations that I  connected with a 10 blade.  Continue my sharp excisional debridement down through the subcutaneous tissue and the underlying muscle and fascia as well as the bone.  I used a combination of knife, curette, rondure.  Altogether I debrided 100 cm.  After thorough debridement I irrigated the wound with 3 L normal saline using pulsatile lavage.  The abscess did probe all the way down to the bone.  The tourniquet was then deflated and hemostasis was obtained with electrocautery.  A small wound VAC sponge was then placed in the wound and sealed with Ioban.  Suction was turned to -125 mmHg.  Patient tolerated procedure well had no immediate complications.  Tessa Lerner was necessary for opening, closing, retracting, limb positioning and overall facilitation and timely completion of the procedure.  POSTOPERATIVE PLAN: The patient will need to return back to the operating room for repeat washout and closure versus revision amputation.  I will contact my partner Dr. Lajoyce Corners for this.  Heparin drip can be restarted in 12 hours.  Mayra Reel, MD 3:10 PM

## 2023-01-26 NOTE — Telephone Encounter (Signed)
Patient Advocate Encounter   Received notification that prior authorization for [employer] 3 Sensor is required.   PA submitted on 01/26/2023 Key [id number] Insurance [facility] Medicaid of [facility] Electronic Prior Authorization Request Form Status is pending       Roland Earl, CPhT Pharmacy Patient Advocate Specialist [facility] Patient Advocate Team Direct Number: [phone]  Fax: [phone]

## 2023-01-26 NOTE — Progress Notes (Signed)
Pt transferred to short stay via bed by transportation staff

## 2023-01-26 NOTE — Anesthesia Procedure Notes (Signed)
Procedure Name: LMA Insertion Date/Time: 01/26/2023 2:32 PM  Performed by: Aundria Rud, CRNAPre-anesthesia Checklist: Patient identified, Patient being monitored, Timeout performed, Emergency Drugs available and Suction available Patient Re-evaluated:Patient Re-evaluated prior to induction Oxygen Delivery Method: Circle system utilized Preoxygenation: Pre-oxygenation with 100% oxygen Induction Type: IV induction Ventilation: Mask ventilation without difficulty LMA: LMA inserted LMA Size: 4.0 Tube type: Oral Number of attempts: 1 Placement Confirmation: positive ETCO2 and breath sounds checked- equal and bilateral Tube secured with: Tape Dental Injury: Teeth and Oropharynx as per pre-operative assessment

## 2023-01-26 NOTE — Progress Notes (Signed)
[facility] Progress Note  Patient: Adam Long     UJW:[id number]  DOA: 01/23/2023   PCP: [name], DO       Brief hospital course: This is a 47 year old male with type 2 diabetes mellitus, uncontrolled, CAD, hypertension, AVR, dyslipidemia, left BKA with revision x 2, hot and obesity who presents to the hospital for left lower extremity wound caused by his prosthesis along with bleeding.  He stated he stopped wearing his prosthesis about 5 days before.  Has a history of MSSA bacteremia in the setting of a left diabetic foot ulcer in 2023 which was treated with a BKA and IV antibiotics.  Also has a history of E. coli, Proteus mirabilis and E faecalis and a separate infection with-a, E faecalis, Clostridium perfringens and multidrug-resistant Klebsiella pneumonia growing from the wound CT scan showed findings suggestive of abscesses without osteomyelitis. 4/29 evaluated by orthopedic surgery and I&D recommended.  Coumadin placed on hold and continued on vancomycin and meropenem.  Subjective:  No significant complaints today.  We discussed his severely uncontrolled diabetes today.  He states that he does not need diet education and has a full understanding of this.  He states that his long-acting insulin, Lantus which was 30 units daily was discontinued last year.  He believes he was also on a sliding scale but this was discontinued as well.  He states that he did not have trouble checking his CBGs or giving himself insulin prior to discontinuation.  Assessment and Plan: Principal Problem:   Cellulitis and abscess of left leg stump -Multiple infections in the past requiring revisions - Plan for I&D today - culture > MRSA- Vancomycin- appreciate ID and Ortho evals - nutrition consult to assist with nutrition to allow for wound healing  Active Problems:   Uncontrolled type 2 diabetes mellitus with hyperglycemia (HCC) -A1c 13.4-takes metformin at home -Lantus increased to 30  units-will hold off on increasing further today - resolution of infection (surgery) may help sugars improve - Sliding scale insulin continued - He will need new prescriptions when discharged, including a prescription for the continuous blood glucose monitor    HTN (hypertension) -Metoprolol and lisinopril ordered    CAD (coronary artery disease)   Hyperlipidemia associated with type 2 diabetes mellitus (HCC)   S/P aortic valve replacement with mechanical valve -Resume heparin after procedure  Mild hyponatremia - appears chronically low    Anxiety - Zoloft    GERD (gastroesophageal reflux disease) -Protonix  Hypokalemia - Potassium 3.3-has improved to 4.3   Normocytic anemia - Hemoglobin range 10-12  Severe hypoalbuminemia - Albumin level 1.9    Class 3 obesity (HCC) -He states he has not lost or gained any weight recently Body mass index is 51.66 kg/m.      Code Status: Full Code Consultants: orthopedic surgery Level of Care: Level of care: Med-Surg Total time on patient care: 35 min DVT prophylaxis:  SCDs Start: 01/23/23 2131     Objective:   Vitals:   01/25/23 2045 01/26/23 0454 01/26/23 0456 01/26/23 1313  BP: 119/66 132/69  (!) 146/82  Pulse: 76 68  66  Resp: 18 18  18   Temp: 98.2 F (36.8 C) 97.8 F (36.6 C)  98.2 F (36.8 C)  TempSrc: Oral Oral  Oral  SpO2: 97%   96%  Weight:   (!) 163.3 kg   Height:       Filed Weights   01/24/23 0500 01/25/23 0436 01/26/23 0456  Weight: (!) 163.1 kg Marland Kitchen)  163.5 kg (!) 163.3 kg   Exam: General exam: Appears comfortable  HEENT: oral mucosa moist Respiratory system: Clear to auscultation.  Cardiovascular system: S1 & S2 heard  Gastrointestinal system: Abdomen soft, non-tender, nondistended. Normal bowel sounds   Extremities: No cyanosis, clubbing - left leg dressing not opened- right has scab on lower post aspect Psychiatry:  Mood & affect appropriate.      CBC: Recent Labs  Lab 01/23/23 1518  01/24/23 0047 01/25/23 0336 01/26/23 0522  WBC 10.6* 9.2 8.7 8.6  NEUTROABS 7.8* 7.2  --   --   HGB 10.8* 10.5* 11.3* 11.0*  HCT 33.3* 31.4* 34.4* 35.2*  MCV 87.2 85.1 87.1 89.1  PLT 261 249 255 285   Basic Metabolic Panel: Recent Labs  Lab 01/23/23 1518 01/24/23 0047 01/25/23 0336 01/26/23 0522  NA 128* 129* 130* 130*  K 3.5 3.3* 4.6 4.3  CL 96* 97* 99 99  CO2 19* 24 24 25   GLUCOSE 450* 362* 336* 306*  BUN 18 13 13 10   CREATININE 0.82 0.84 0.88 0.84  CALCIUM 8.4* 8.2* 8.3* 8.2*  MG  --  1.9  --  1.9  PHOS  --   --   --  3.5   GFR: Estimated Creatinine Clearance: 169.6 mL/min (by C-G formula based on SCr of 0.84 mg/dL).  Scheduled Meds:  [MAR Hold] ezetimibe  10 mg Oral Daily   [MAR Hold] gabapentin  600 mg Oral BID   [MAR Hold] insulin aspart  0-15 Units Subcutaneous Q4H   [MAR Hold] insulin glargine-yfgn  30 Units Subcutaneous Daily   [MAR Hold] lisinopril  20 mg Oral Daily   [MAR Hold] metoprolol tartrate  25 mg Oral BID   [MAR Hold] nystatin cream   Topical BID   [MAR Hold] pantoprazole  40 mg Oral Daily   [MAR Hold] sertraline  50 mg Oral Daily   Continuous Infusions:  lactated ringers     [MAR Hold] meropenem (MERREM) IV 1 g (01/26/23 0508)   [MAR Hold] vancomycin 1,500 mg (01/26/23 0559)   Imaging and lab data was personally reviewed No results found.  LOS: 3 days   Author: Calvert Cantor  01/26/2023 2:19 PM  To contact [facility]>   Check the care team in [facility] and look for the attending/consulting [facility] provider listed  Log into [email or web address] and use [facility] universal password   Go to> "[facility]"  and find provider  If you still have difficulty reaching the provider, please page the [facility] (Director on Call) for the Hospitalists listed on amion

## 2023-01-26 NOTE — Telephone Encounter (Signed)
Patient Advocate Encounter  Prior Authorization for [employer] 3 Sensor  has been approved.    PA# [id number] Insurance [facility] Medicaid of [facility] Electronic Prior Authorization Request Form  Effective dates: 01/26/2023 through 07/25/2023  Patients co-pay is $0.00.     Roland Earl, CPhT Pharmacy Patient Advocate Specialist [facility] Patient Advocate Team Direct Number: [phone]  Fax: [phone]

## 2023-01-26 NOTE — Progress Notes (Signed)
Pt arrived back to 6 north room 25 alert and oriented x4. Pain level 4/10. Wound vac in place on left BKA. Dressing clean, dry , intact. Bed in lowest position, call light in reach. Bed alarm on. Will continue to monitor pt.

## 2023-01-26 NOTE — Progress Notes (Signed)
RN from 6N made aware to send Merrem abx to tube 75.

## 2023-01-26 NOTE — Progress Notes (Signed)
ANTICOAGULATION CONSULT NOTE - Follow Up Consult  Pharmacy Consult for heparin Indication:  AVR  Labs: Recent Labs    01/23/23 1518 01/23/23 1529 01/24/23 0047 01/25/23 0336 01/25/23 1753 01/26/23 0522  HGB 10.8*  --  10.5* 11.3*  --  11.0*  HCT 33.3*  --  31.4* 34.4*  --  35.2*  PLT 261  --  249 255  --  285  LABPROT  --  16.4*  --  18.1*  --   --   INR  --  1.3*  --  1.5*  --   --   HEPARINUNFRC  --   --   --   --  <0.10* <0.10*  CREATININE 0.82  --  0.84 0.88  --   --     Assessment: 47yo male subtherapeutic on heparin after rate change; no infusion issues or signs of bleeding per RN; plan for OR this afternoon.  Goal of Therapy:  Heparin level 0.3-0.7 units/ml   Plan:  4000 units heparin bolus. Increase heparin infusion by 4 units/kGABW/hr to 2500 units/hr. Check level prior to holding heparin for procedure.   Vernard Gambles, PharmD, BCPS 01/26/2023 6:03 AM

## 2023-01-26 NOTE — H&P (Signed)

## 2023-01-26 NOTE — Anesthesia Preprocedure Evaluation (Addendum)
Anesthesia Evaluation  Patient identified by MRN, date of birth, ID band Patient awake    Reviewed: Allergy & Precautions, H&P , NPO status , Patient's Chart, lab work & pertinent test results, reviewed documented beta blocker date and time   History of Anesthesia Complications Negative for: history of anesthetic complications  Airway Mallampati: II  TM Distance: >3 FB Neck ROM: Full    Dental  (+) Edentulous Upper, Edentulous Lower, Dental Advisory Given   Pulmonary neg pulmonary ROS   breath sounds clear to auscultation       Cardiovascular hypertension, Pt. on medications and Pt. on home beta blockers + CAD (nonobstructive on cath 2020)  + Valvular Problems/Murmurs (s/p AVR 2021, normal valve on echo 2022)  Rhythm:Regular Rate:Normal  Echo 2022  1. Left ventricular ejection fraction, by estimation, is 60 to 65%. The  left ventricle has normal function. The left ventricle has no regional  wall motion abnormalities.   2. Right ventricular systolic function is normal. The right ventricular  size is mildly enlarged.   3. No left atrial/left atrial appendage thrombus was detected.   4. The mitral valve is normal in structure. Trivial mitral valve  regurgitation.   5. There is a 25 mm bileaflet valve present in the aortic position      Aortic valve regurgitation is not visualized. Echo findings are  consistent with normal structure and function of the aortic valve  prosthesis. Aortic valve mean gradient measures 13.0 mmHg.   Cath 2020 1.  Calcified coronary arteries with moderate mid LAD stenosis, otherwise mild nonobstructive disease 2.  Known severe aortic stenosis by echo assessment with heavy calcification and restriction of the aortic valve leaflets as assessed by plain fluoroscopy 3.  Severe pulmonary hypertension with mean PA pressure 51 mmHg, at least in part to left heart disease with elevated wedge pressure of 31 mmHg    Recommendations: Ongoing multidisciplinary heart team evaluation for treatment of severe symptomatic aortic stenosis.  Medical therapy for nonobstructive CAD.    Neuro/Psych  PSYCHIATRIC DISORDERS Anxiety Depression    negative neurological ROS     GI/Hepatic Neg liver ROS,GERD  Controlled and Medicated,,  Endo/Other  diabetes, Poorly Controlled, Type 2, Oral Hypoglycemic Agents  Morbid obesityA1c 13.4 BMI 52  Renal/GU negative Renal ROS     Musculoskeletal BKA stump infection   Abdominal  (+) + obese  Peds  Hematology  (+) Blood dyscrasia, anemia Hb 11, plt 285   Anesthesia Other Findings   Reproductive/Obstetrics                             Anesthesia Physical Anesthesia Plan  ASA: 3  Anesthesia Plan: General   Post-op Pain Management: Ofirmev IV (intra-op)*   Induction: Intravenous  PONV Risk Score and Plan: 2 and Ondansetron, Midazolam and Treatment may vary due to age or medical condition  Airway Management Planned: LMA  Additional Equipment: None  Intra-op Plan:   Post-operative Plan: Extubation in OR  Informed Consent: I have reviewed the patients History and Physical, chart, labs and discussed the procedure including the risks, benefits and alternatives for the proposed anesthesia with the patient or authorized representative who has indicated his/her understanding and acceptance.     Dental advisory given  Plan Discussed with: CRNA  Anesthesia Plan Comments:         Anesthesia Quick Evaluation

## 2023-01-26 NOTE — Inpatient Diabetes Management (Signed)
Inpatient Diabetes Program Recommendations  AACE/ADA: New Consensus Statement on Inpatient Glycemic Control (2015)  Target Ranges:  Prepandial:   less than 140 mg/dL      Peak postprandial:   less than 180 mg/dL (1-2 hours)      Critically ill patients:  140 - 180 mg/dL   Lab Results  Component Value Date   GLUCAP 303 (H) 01/26/2023   HGBA1C 13.4 (H) 01/24/2023    Review of Glycemic Control  Latest Reference Range & Units 01/25/23 12:09 01/25/23 17:07 01/25/23 20:42 01/26/23 08:34  Glucose-Capillary 70 - 99 mg/dL 161 (H) 096 (H) 045 (H) 303 (H)  (H): Data is abnormally high Diabetes history: DM  Outpatient Diabetes medications:  Metformin 1000 mg bid Current orders for Inpatient glycemic control:  Novolog moderate Q4H Semglee 30 units daily   Inpatient Diabetes Program Recommendations:    Consider further increasing Semglee to 36 units QD.   Thanks, Lujean Rave, MSN, RNC-OB Diabetes Coordinator [phone] (8a-5p)

## 2023-01-26 NOTE — Progress Notes (Signed)
Patients cell phone, charger, and glasses left on bedside table

## 2023-01-27 ENCOUNTER — Encounter (HOSPITAL_COMMUNITY): Payer: Self-pay | Admitting: Orthopaedic Surgery

## 2023-01-27 DIAGNOSIS — L02416 Cutaneous abscess of left lower limb: Secondary | ICD-10-CM | POA: Diagnosis not present

## 2023-01-27 DIAGNOSIS — L03116 Cellulitis of left lower limb: Secondary | ICD-10-CM | POA: Diagnosis not present

## 2023-01-27 LAB — CBC
HCT: 33.8 % — ABNORMAL LOW (ref 39.0–52.0)
Hemoglobin: 10.6 g/dL — ABNORMAL LOW (ref 13.0–17.0)
MCH: 28.2 pg (ref 26.0–34.0)
MCHC: 31.4 g/dL (ref 30.0–36.0)
MCV: 89.9 fL (ref 80.0–100.0)
Platelets: 289 10*3/uL (ref 150–400)
RBC: 3.76 MIL/uL — ABNORMAL LOW (ref 4.22–5.81)
RDW: 13.4 % (ref 11.5–15.5)
WBC: 7.7 10*3/uL (ref 4.0–10.5)
nRBC: 0 % (ref 0.0–0.2)

## 2023-01-27 LAB — GLUCOSE, CAPILLARY
Glucose-Capillary: 162 mg/dL — ABNORMAL HIGH (ref 70–99)
Glucose-Capillary: 185 mg/dL — ABNORMAL HIGH (ref 70–99)
Glucose-Capillary: 234 mg/dL — ABNORMAL HIGH (ref 70–99)
Glucose-Capillary: 208 mg/dL — ABNORMAL HIGH (ref 70–99)

## 2023-01-27 LAB — VANCOMYCIN, PEAK: Vancomycin Pk: 31 ug/mL (ref 30–40)

## 2023-01-27 LAB — HEPARIN LEVEL (UNFRACTIONATED)
Heparin Unfractionated: <0.1 IU/mL — ABNORMAL LOW (ref 0.30–0.70)
Heparin Unfractionated: <0.1 IU/mL — ABNORMAL LOW (ref 0.30–0.70)

## 2023-01-27 LAB — AEROBIC/ANAEROBIC CULTURE W GRAM STAIN (SURGICAL/DEEP WOUND)

## 2023-01-27 MED ORDER — HEPARIN BOLUS VIA INFUSION
3500.0000 [IU] | Freq: Once | INTRAVENOUS | Status: AC
  Administered 2023-01-27: 3500 [IU] via INTRAVENOUS
  Filled 2023-01-27: qty 3500

## 2023-01-27 MED ORDER — ADULT MULTIVITAMIN W/MINERALS CH
1.0000 | ORAL_TABLET | Freq: Every day | ORAL | Status: DC
  Administered 2023-01-27 – 2023-02-03 (×7): 1 via ORAL
  Filled 2023-01-27 (×7): qty 1

## 2023-01-27 MED ORDER — JUVEN PO PACK
1.0000 | PACK | Freq: Two times a day (BID) | ORAL | Status: DC
  Administered 2023-01-28 – 2023-02-03 (×13): 1 via ORAL
  Filled 2023-01-27 (×13): qty 1

## 2023-01-27 MED ORDER — HEPARIN (PORCINE) 25000 UT/250ML-% IV SOLN
3200.0000 [IU]/h | INTRAVENOUS | Status: AC
  Administered 2023-01-27 – 2023-01-28 (×4): 3200 [IU]/h via INTRAVENOUS
  Filled 2023-01-27 (×4): qty 250

## 2023-01-27 NOTE — Plan of Care (Signed)

## 2023-01-27 NOTE — Progress Notes (Addendum)
Subjective: 1 Day Post-Op Procedure(s) (LRB): IRRIGATION AND DEBRIDEMENT OF LEFT BKA STUMP (Left) APPLICATION OF WOUND VAC TO LEFT STUMP (Left) Patient reports pain as mild.    Objective: Vital signs in last 24 hours: Temp:  [98.2 F (36.8 C)-99 F (37.2 C)] 98.5 F (36.9 C) (05/02 0503) Pulse Rate:  [66-80] 78 (05/02 0503) Resp:  [14-19] 16 (05/02 0503) BP: (118-146)/(65-90) 126/65 (05/02 0503) SpO2:  [95 %-100 %] 100 % (05/02 0503) Weight:  [540 kg] 163 kg (05/02 0500)  Intake/Output from previous day: 05/01 0701 - 05/02 0700 In: 2642.1 [P.O.:390; I.V.:1529.6; IV Piggyback:722.5] Out: 650 [Urine:475; Drains:150; Blood:25] Intake/Output this shift: No intake/output data recorded.  Recent Labs    01/25/23 0336 01/26/23 0522  HGB 11.3* 11.0*   Recent Labs    01/25/23 0336 01/26/23 0522  WBC 8.7 8.6  RBC 3.95* 3.95*  HCT 34.4* 35.2*  PLT 255 285   Recent Labs    01/25/23 0336 01/26/23 0522  NA 130* 130*  K 4.6 4.3  CL 99 99  CO2 24 25  BUN 13 10  CREATININE 0.88 0.84  GLUCOSE 336* 306*  CALCIUM 8.3* 8.2*   Recent Labs    01/25/23 0336  INR 1.5*    PHYSICAL EXAM Wound vac in place and functioning with good seal to left bka stump.  Approx 125 cc blood in canister    Assessment/Plan: 1 Day Post-Op Procedure(s) (LRB): IRRIGATION AND DEBRIDEMENT OF LEFT BKA STUMP (Left) APPLICATION OF WOUND VAC TO LEFT STUMP (Left) PLAN NWB LLE Ok from ortho standpoint to restart heparin drip Continue with vancomycin/meropenem  Dr. Lajoyce Corners to see later this week for repeat I&D vs revision bka       Cristie Hem 01/27/2023, 7:42 AM

## 2023-01-27 NOTE — Anesthesia Postprocedure Evaluation (Signed)
Anesthesia Post Note  Patient: Adam Long  Procedure(s) Performed: IRRIGATION AND DEBRIDEMENT OF LEFT BKA STUMP (Left: Leg Lower) APPLICATION OF WOUND VAC TO LEFT STUMP (Left: Leg Lower)     Patient location during evaluation: PACU Anesthesia Type: General Level of consciousness: awake and alert Pain management: pain level controlled Vital Signs Assessment: post-procedure vital signs reviewed and stable Respiratory status: spontaneous breathing, nonlabored ventilation and respiratory function stable Cardiovascular status: blood pressure returned to baseline and stable Postop Assessment: no apparent nausea or vomiting Anesthetic complications: no   No notable events documented.  Last Vitals:  Vitals:   01/27/23 0746 01/27/23 1644  BP: 139/84 120/89  Pulse: 74 71  Resp: 17 17  Temp: 36.5 C 36.8 C  SpO2: 97% 100%    Last Pain:  Vitals:   01/27/23 1644  TempSrc: Oral  PainSc:                  Keval Nam

## 2023-01-27 NOTE — Progress Notes (Signed)
ANTICOAGULATION CONSULT NOTE - Follow Up Consult  Pharmacy Consult for heparin Indication:  mechanical AVR  No Known Allergies  Patient Measurements: Height: 5\' 10"  (177.8 cm) Weight: (!) 163 kg (359 lb 5.6 oz) IBW/kg (Calculated) : 73 Heparin Dosing Weight: 112.8 kg  Vital Signs: Temp: 97.7 F (36.5 C) (05/02 0746) Temp Source: Oral (05/02 0746) BP: 139/84 (05/02 0746) Pulse Rate: 74 (05/02 0746)  Labs: Recent Labs    01/25/23 0336 01/25/23 1753 01/26/23 0522  HGB 11.3*  --  11.0*  HCT 34.4*  --  35.2*  PLT 255  --  285  LABPROT 18.1*  --   --   INR 1.5*  --   --   HEPARINUNFRC  --  <0.10* <0.10*  CREATININE 0.88  --  0.84     Estimated Creatinine Clearance: 169.4 mL/min (by C-G formula based on SCr of 0.84 mg/dL).  Assessment: 47 yo M who presents with cellulitis and abscess of L BKA stump and has been initiated on broad-spectrum antibiotics. Plan is for surgical I&D on 5/1 with orthopedic surgery. Patient has a mechanical AVR and was taking warfarin 10mg  T/R/Sa/Su and 5mg  on MWF. This regimen was last reviewed in outpt anticoag clinic on 4/9 with therapeutic INR of 2.5. Pt reports last dose of warfarin was ~1 wk prior to admission. INR on admission was 1.3, subtherapeutic. Warfarin was held on admission in anticipation for possible surgery. Pharmacy consulted to dose heparin infusion for mechanical AVR.   Patient is s/p I&D on 5/1. Heparin infusion resumed at 03:23 on 5/2  Heparin level <0.1, subtherapeutic Current heparin infusion rate: 2500 units/hr  Hgb 10.6, Plt 289 - remain stable No s/sx of bleeding noted   Goal of Therapy:  Heparin level 0.3-0.7 units/ml Monitor platelets by anticoagulation protocol: Yes   Plan:  Increase heparin infusion to 2900 units/hr Check heparin level in 6 hrs Daily heparin level and CBC while on heparin Monitor for signs/symptoms of bleeding F/U plan to return to OR  Thank you for involving pharmacy in this patient's  care.  Wilburn Cornelia, PharmD, BCPS Clinical Pharmacist 01/27/2023 12:32 PM   Please refer to [facility] for pharmacy phone number

## 2023-01-27 NOTE — Progress Notes (Signed)
ANTICOAGULATION CONSULT NOTE - Follow Up Consult  Pharmacy Consult for heparin Indication:  mechanical AVR  No Known Allergies  Patient Measurements: Height: 5\' 10"  (177.8 cm) Weight: (!) 163 kg (359 lb 5.6 oz) IBW/kg (Calculated) : 73 Heparin Dosing Weight: 112.8 kg  Vital Signs: Temp: 98.5 F (36.9 C) (05/02 2005) Temp Source: Oral (05/02 2005) BP: 130/80 (05/02 2005) Pulse Rate: 70 (05/02 2005)  Labs: Recent Labs    01/25/23 0336 01/25/23 1753 01/26/23 0522 01/27/23 1211 01/27/23 2027  HGB 11.3*  --  11.0* 10.6*  --   HCT 34.4*  --  35.2* 33.8*  --   PLT 255  --  285 289  --   LABPROT 18.1*  --   --   --   --   INR 1.5*  --   --   --   --   HEPARINUNFRC  --    < > <0.10* <0.10* <0.10*  CREATININE 0.88  --  0.84  --   --    < > = values in this interval not displayed.     Estimated Creatinine Clearance: 169.4 mL/min (by C-G formula based on SCr of 0.84 mg/dL).  Assessment: 47 yo M who presents with cellulitis and abscess of L BKA stump and has been initiated on broad-spectrum antibiotics. Plan is for surgical I&D on 5/1 with orthopedic surgery. Patient has a mechanical AVR and was taking warfarin 10mg  T/R/Sa/Su and 5mg  on MWF. This regimen was last reviewed in outpt anticoag clinic on 4/9 with therapeutic INR of 2.5. Pt reports last dose of warfarin was ~1 wk prior to admission. INR on admission was 1.3, subtherapeutic. Warfarin was held on admission in anticipation for possible surgery. Pharmacy consulted to dose heparin infusion for mechanical AVR.   Patient is s/p I&D on 5/1. Heparin infusion resumed at 03:23 on 5/2  Heparin level <0.1, subtherapeutic, no pauses/interruptions to infusion reported Current heparin infusion rate: 2900 units/hr No s/sx of bleeding noted   Goal of Therapy:  Heparin level 0.3-0.7 units/ml Monitor platelets by anticoagulation protocol: Yes   Plan:  Heparin 3500 units IV bolus Increase heparin infusion to 3200 units/hr Check  heparin level in 6 hrs Daily heparin level and CBC while on heparin Monitor for signs/symptoms of bleeding F/U plan to return to OR  Thank you for involving pharmacy in this patient's care.  Loralee Pacas, PharmD, BCPS Please see amion for complete clinical pharmacist phone list 01/27/2023 9:33 PM   Please refer to [facility] for pharmacy phone number

## 2023-01-27 NOTE — Progress Notes (Signed)
Initial Nutrition Assessment  DOCUMENTATION CODES:   Morbid obesity  INTERVENTION:  Juven BID MVI  Education on T2DM management    NUTRITION DIAGNOSIS:   Limited adherence to nutrition-related recommendations related to wound healing as evidenced by per patient/family report.   GOAL:   Patient will meet greater than or equal to 90% of their needs   MONITOR:   PO intake, Labs, Supplement acceptance, Weight trends, Skin, I & O's  REASON FOR ASSESSMENT:   Consult Assessment of nutrition requirement/status  ASSESSMENT:   47 y.o. male with PMHx including uncontrolled T2DM, CAD, HTN, AVR, dyslipidemia, left BKA with revision x2 who presents with left lower extremity wound caused by his prosthesis along with bleeding.  S/p L BKA stump irrigation & debridement   Labs: hga1c 13.4,  Na 130, Glu 306 Meds: colace, insulin, meerm, mycostatin, protonix, vancoready, NS  Wt: admit wt 370#, CBW 359#  -wt loss related to fluid?  PO: 100% meal intake for all meals  I/O's: +300 ml    Visited patient at bedside who reports a good appetite. He immediately started asking for Juven ONS for wound healing. HE reports having good results using it before. Patient had diet Mtn Dew at bedside which he reports he only drinks diet Mtn Dew. RD educated him on adverse effects of sugar substitutes. He understands. RD suggest electrolyte tablets in sparkling water to give similar soda sensation.   Patient reports he got his hga1c down to 6-7 when he was taking metformin and insulin but then his MD d/c'd the insulin and continued the metformin which patient reports he stopped taking due to frequent loose BM's and the urgency they have. He reports he has been approved for the Libre glucose monitor and that he will be starting on insulin again.    NUTRITION - FOCUSED PHYSICAL EXAM:  Flowsheet Row Most Recent Value  Orbital Region No depletion  Upper Arm Region No depletion  Thoracic and Lumbar Region  No depletion  Buccal Region No depletion  Temple Region No depletion  Clavicle Bone Region No depletion  Clavicle and Acromion Bone Region No depletion  Scapular Bone Region No depletion  Dorsal Hand No depletion  Patellar Region No depletion  Anterior Thigh Region No depletion  Posterior Calf Region No depletion  Edema (RD Assessment) Unable to assess  Hair Reviewed  Eyes Reviewed  Mouth Reviewed  Skin Reviewed  Nails Reviewed       Diet Order:   Diet Order             Diet Carb Modified Fluid consistency: Thin; Room service appropriate? Yes  Diet effective now                   EDUCATION NEEDS:   Education needs have been addressed  Skin:  Skin Assessment: Skin Integrity Issues: Skin Integrity Issues:: Incisions Incisions: L leg stump with drain  Last BM:  4/30  Height:   Ht Readings from Last 1 Encounters:  01/24/23 5\' 10"  (1.778 m)    Weight:   Wt Readings from Last 1 Encounters:  01/27/23 (!) 163 kg    BMI:  Body mass index is 51.56 kg/m.  Estimated Nutritional Needs:   Kcal:  2200-2600  Protein:  170-230 g  Fluid:  >/= 2 L    Leodis Rains, RDN, LDN  Clinical Nutrition

## 2023-01-27 NOTE — Evaluation (Signed)
Physical Therapy Evaluation Patient Details Name: Adam Long MRN: [id number] DOB: 26-Dec-1975 Today's Date: 01/27/2023  History of Present Illness  47 y.o. male presents 01/24/23 with a chief complaint of left residual lower extremity pain and draining wound. +cellulitis and abscess; 5/01 I&D of L BKA; PMH includes: anxiety, aortic stenosis, CAD, depression, DM II, HTN, HLD, morbid obesity, AVR, amputations of L toes -> L BKA in 12/22  Clinical Impression   Patient is s/p above surgery resulting in functional limitations due to the deficits listed below (see PT Problem List). Patient was independent with prosthesis or with use of wheelchair prior to illness. He works at [employer]. He currently required minguard assist for transfer due to his use of one hand on RW and one pushing from bed. RW nearly tipped and pt recovered without physical assist. Patient too fatigued to ambulate with RW today, and feels he will do fine with either RW or wheelchair on discharge. Will follow to see again after second surgery for assessment of needs.  Patient will benefit from acute skilled PT to increase their independence and safety with mobility to facilitate discharge.        Recommendations for follow up therapy are one component of a multi-disciplinary discharge planning process, led by the attending physician.  Recommendations may be updated based on patient status, additional functional criteria and insurance authorization.  Follow Up Recommendations       Assistance Recommended at Discharge PRN  Patient can return home with the following  Assistance with cooking/housework;Assist for transportation;Help with stairs or ramp for entrance    Equipment Recommendations Other (comment);Rolling walker (2 wheels) (bariatric RW)  Recommendations for Other Services       Functional Status Assessment Patient has had a recent decline in their functional status and demonstrates the ability to make significant  improvements in function in a reasonable and predictable amount of time.     Precautions / Restrictions Precautions Precautions: Fall Restrictions Weight Bearing Restrictions: No      Mobility  Bed Mobility Overal bed mobility: Modified Independent             General bed mobility comments: sitting EOB on arrival, finishing his bath    Transfers Overall transfer level: Needs assistance Equipment used: Rolling walker (2 wheels) Transfers: Sit to/from Stand, Bed to chair/wheelchair/BSC Sit to Stand: Min guard   Step pivot transfers: Min guard       General transfer comment: pt uses one hand on RW during transfer with near tipping of RW, but able to manage without assist; reports difficulty "hopping" due to sticky texture of hospital sock, reports he can do better with his shoe on    Ambulation/Gait             Pre-gait activities: hop to chair with RW; due to fatigue/discomfort did not want to try walking farther    Stairs            Wheelchair Mobility    Modified Rankin (Stroke Patients Only)       Balance Overall balance assessment: Modified Independent                                           Pertinent Vitals/Pain Pain Assessment Pain Assessment: Faces Faces Pain Scale: Hurts little more Pain Location: lt BKA Pain Descriptors / Indicators: Discomfort Pain Intervention(s): Limited activity within patient's tolerance, Monitored  during session    Home Living Family/patient expects to be discharged to:: Private residence Living Arrangements: Other relatives (uncle) Available Help at Discharge: Family;Friend(s);Available PRN/intermittently Type of Home: House Home Access: Stairs to enter Entrance Stairs-Rails: None Entrance Stairs-Number of Steps: 2 short steps Alternate Level Stairs-Number of Steps: flight Home Layout: Two level;Able to live on main level with bedroom/bathroom Home Equipment: Rolling Walker (2  wheels);BSC/3in1;Wheelchair - manual Additional Comments: pt reports RW is in disrepair and requesting a new one    Prior Function Prior Level of Function : Independent/Modified Independent             Mobility Comments: was using prosthesis vs w/c for locomotion prior to infection; can do steps in/out of house even without prosthesis by using his knee ADLs Comments: reports independent     Hand Dominance   Dominant Hand: Right    Extremity/Trunk Assessment   Upper Extremity Assessment Upper Extremity Assessment: Overall WFL for tasks assessed    Lower Extremity Assessment Lower Extremity Assessment: Overall WFL for tasks assessed;LLE deficits/detail (Lt BKA s/p I&D with VAC attached)    Cervical / Trunk Assessment Cervical / Trunk Assessment: Other exceptions Cervical / Trunk Exceptions: morbid obesity  Communication   Communication: No difficulties  Cognition Arousal/Alertness: Awake/alert Behavior During Therapy: WFL for tasks assessed/performed Overall Cognitive Status: Within Functional Limits for tasks assessed                                          General Comments      Exercises     Assessment/Plan    PT Assessment Patient needs continued PT services  PT Problem List Decreased activity tolerance;Decreased mobility;Decreased knowledge of use of DME;Obesity;Decreased skin integrity;Pain       PT Treatment Interventions DME instruction;Gait training;Functional mobility training;Therapeutic activities;Patient/family education    PT Goals (Current goals can be found in the Care Plan section)  Acute Rehab PT Goals Patient Stated Goal: to get a new walker as his is in disrepair PT Goal Formulation: With patient Time For Goal Achievement: 02/10/23 Potential to Achieve Goals: Good    Frequency Min 4X/week     Co-evaluation               AM-PAC PT "6 Clicks" Mobility  Outcome Measure Help needed turning from your back to your  side while in a flat bed without using bedrails?: None Help needed moving from lying on your back to sitting on the side of a flat bed without using bedrails?: None Help needed moving to and from a bed to a chair (including a wheelchair)?: A Little Help needed standing up from a chair using your arms (e.g., wheelchair or bedside chair)?: A Little Help needed to walk in hospital room?: Total Help needed climbing 3-5 steps with a railing? : Total 6 Click Score: 16    End of Session   Activity Tolerance: Patient limited by fatigue Patient left: in chair;with call bell/phone within reach;with chair alarm set Nurse Communication: Mobility status PT Visit Diagnosis: Difficulty in walking, not elsewhere classified (R26.2)    Time: 1610-9604 PT Time Calculation (min) (ACUTE ONLY): 20 min   Charges:   PT Evaluation $PT Eval Low Complexity: 1 Low           Jerolyn Center, PT Acute Rehabilitation Services  Office [phone]   Zena Amos 01/27/2023, 3:08 PM

## 2023-01-27 NOTE — Progress Notes (Addendum)
[facility] Progress Note  Patient: Adam Long     ZOX:[id number]  DOA: 01/23/2023   PCP: [name], DO       Brief hospital course: 47 year old male with type 2 diabetes mellitus, uncontrolled, CAD, hypertension, AVR, dyslipidemia, left BKA with revision x 2, morbid obesity who presents to the hospital for left lower extremity wound caused by his prosthesis along with bleeding.  He stated he stopped wearing his prosthesis about 5 days before.  Has a history of MSSA bacteremia in the setting of a left diabetic foot ulcer in 2023 which was treated with a BKA and IV antibiotics.  Also has a history of E. coli, Proteus mirabilis and E faecalis and a separate infection with-a, E faecalis, Clostridium perfringens and multidrug-resistant Klebsiella pneumonia growing from the wound. CT scan showed findings suggestive of abscesses without osteomyelitis.  S/p irrigation and excisional debridement with wound VAC application 5/1 with plan for repeat washout and closure versus revision amputation by Dr. Lajoyce Corners 5/3.  Coumadin held, on IV heparin and IV antibiotics.   Subjective:  Patient denied complaints.  No pain reported either.  Reported that he was likely going back to the OR tomorrow with Dr. Lajoyce Corners.  Assessment and Plan: Principal Problem: Left BKA MRSA stump abscess -Multiple infections in the past requiring revisions - S/p irrigation and excisional debridement with wound VAC application 5/1 with plan for repeat washout and closure versus revision amputation by Dr. Lajoyce Corners 5/3.   -Culture confirms MRSA.  As communicated with multiple pharmacist today, ID pharmacist, DC meropenem and continue vancomycin alone. - nutrition consulted to assist with nutrition to allow for wound healing  Active Problems:   Uncontrolled type 2 diabetes mellitus with hyperglycemia (HCC) -A1c 13.4-takes metformin at home -Lantus increased to 30 units - resolution of infection (surgery) may help sugars  improve - Sliding scale insulin continued - He will need new prescriptions when discharged, including a prescription for the continuous blood glucose monitor.  As per DM coordinator, plan to place CBG monitor on 5/3. -Improved glycemic control with CBGs mostly in the high 100s.    HTN (hypertension) -Controlled on metoprolol and lisinopril.    CAD (coronary artery disease) No anginal symptoms.    Hyperlipidemia associated with type 2 diabetes mellitus (HCC) Continue Zetia.    S/P aortic valve replacement with mechanical valve -Remains on heparin infusion pending completion of all procedures.  Coumadin held.  Mild hyponatremia - appears chronically low    Anxiety - Zoloft    GERD (gastroesophageal reflux disease) -Protonix  Hypokalemia - Replaced  Normocytic anemia - Stable  Severe hypoalbuminemia - Albumin level 1.9.  Dietitian consulted.  Body mass index is 51.56 kg/m./Very morbid obesity Complicates care including wound healing from surgery, DM control etc.  ACP documents: None present.   Code Status: Full Code Consultants: orthopedic surgery DVT prophylaxis:  SCDs Start: 01/26/23 1613 SCDs Start: 01/23/23 2131.  Currently on IV heparin drip. Family communication: None at bedside.     Objective:   Vitals:   01/27/23 0500 01/27/23 0502 01/27/23 0503 01/27/23 0746  BP:  126/77 126/65 139/84  Pulse:  64 78 74  Resp:   16 17  Temp:  98.3 F (36.8 C) 98.5 F (36.9 C) 97.7 F (36.5 C)  TempSrc:  Oral Oral Oral  SpO2:  99% 100% 97%  Weight: (!) 163 kg     Height:       Filed Weights   01/25/23 0436 01/26/23 0456 01/27/23 0500  Weight: (!) 163.5 kg (!) 163.3 kg (!) 163 kg   Exam: General exam: Young male, moderately built and morbidly obese sitting up comfortably in bed without distress. Respiratory system: Clear to auscultation. Respiratory effort normal.  Midline sternotomy scar. Cardiovascular system: S1 & S2 heard, RRR. No JVD, murmurs, rubs,  gallops or clicks. No pedal edema.  Not on telemetry. Gastrointestinal system: Abdomen is nondistended, soft and nontender. No organomegaly or masses felt. Normal bowel sounds heard. Central nervous system: Alert and oriented. No focal neurological deficits. Extremities: Symmetric 5 x 5 power.  Left BKA stump site dressing/wound VAC intact without acute findings. Skin: No rashes, lesions or ulcers Psychiatry: Judgement and insight appear normal. Mood & affect appropriate.       CBC: Recent Labs  Lab 01/23/23 1518 01/24/23 0047 01/25/23 0336 01/26/23 0522 01/27/23 1211  WBC 10.6* 9.2 8.7 8.6 7.7  NEUTROABS 7.8* 7.2  --   --   --   HGB 10.8* 10.5* 11.3* 11.0* 10.6*  HCT 33.3* 31.4* 34.4* 35.2* 33.8*  MCV 87.2 85.1 87.1 89.1 89.9  PLT 261 249 255 285 289   Basic Metabolic Panel: Recent Labs  Lab 01/23/23 1518 01/24/23 0047 01/25/23 0336 01/26/23 0522  NA 128* 129* 130* 130*  K 3.5 3.3* 4.6 4.3  CL 96* 97* 99 99  CO2 19* 24 24 25   GLUCOSE 450* 362* 336* 306*  BUN 18 13 13 10   CREATININE 0.82 0.84 0.88 0.84  CALCIUM 8.4* 8.2* 8.3* 8.2*  MG  --  1.9  --  1.9  PHOS  --   --   --  3.5   GFR: Estimated Creatinine Clearance: 169.4 mL/min (by C-G formula based on SCr of 0.84 mg/dL).  Scheduled Meds:  docusate sodium  100 mg Oral BID   ezetimibe  10 mg Oral Daily   gabapentin  600 mg Oral BID   insulin aspart  0-15 Units Subcutaneous Q4H   insulin glargine-yfgn  30 Units Subcutaneous Daily   lisinopril  20 mg Oral Daily   metoprolol tartrate  25 mg Oral BID   nystatin cream   Topical BID   pantoprazole  40 mg Oral Daily   sertraline  50 mg Oral Daily   Continuous Infusions:  sodium chloride 125 mL/hr at 01/27/23 1139   heparin 2,900 Units/hr (01/27/23 1437)   meropenem (MERREM) IV 1 g (01/27/23 1259)   methocarbamol (ROBAXIN) IV     vancomycin 1,500 mg (01/27/23 0641)   Imaging and lab data was personally reviewed No results found.  LOS: 4 days   Marcellus Scott, MD,  FACP, [facility], [facility], [facility], [facility]   [facility] [place]     To contact the attending provider between 7A-7P or the covering provider during after hours 7P-7A, please log into the web site [email or web address] and access using universal [place] password for that web site. If you do not have the password, please call the hospital operator.

## 2023-01-27 NOTE — TOC Initial Note (Signed)
Transition of Care (TOC) - Initial/Assessment Note   Spoke to patient at bedside. Patient confirmed face sheet information. Patient from home with uncle. Uncle works but his cousin lives 7 minutes away and is available if he needs anything.   He has a regular rolling walker at home, not bariatric , he did not receive walker through insurance. Placed order for bariatric walker and left message for Jermaine with [employer]  , await response. Patient already has 3 in 1 at home. Currently PT recommending no PT follow up. PT will see again after procedure tomorrow  Patient Details  Name: Adam Long MRN: [id number] Date of Birth: Apr 06, 1976  Transition of Care [facility]) CM/SW Contact:    Kingsley Plan, RN Phone Number: 01/27/2023, 3:46 PM  Clinical Narrative:                   Expected Discharge Plan: Home/Self Care Barriers to Discharge: Continued Medical Work up   Patient Goals and CMS Choice Patient states their goals for this hospitalization and ongoing recovery are:: to return tohome CMS Medicare.gov Compare Post Acute Care list provided to:: Patient Choice offered to / list presented to : Patient [place] ownership interest in [facility].provided to:: Patient    Expected Discharge Plan and Services   Discharge Planning Services: CM Consult Post Acute Care Choice: Durable Medical Equipment Living arrangements for the past 2 months: Single Family Home                 DME Arranged: Walker rolling ((bariatric RW)) DME Agency: [employer] Date DME Agency Contacted: 01/27/23 Time DME Agency Contacted: [phone] Representative spoke with at DME Agency: Lelon Mast HH Arranged: NA          Prior Living Arrangements/Services Living arrangements for the past 2 months: Single Family Home Lives with:: Other (Comment) (uncle) Patient language and need for interpreter reviewed:: Yes Do you feel safe going back to the place where you live?: Yes      Need for Family  Participation in Patient Care: Yes (Comment) Care giver support system in place?: Yes (comment) Current home services: DME Criminal Activity/Legal Involvement Pertinent to Current Situation/Hospitalization: No - Comment as needed  Activities of Daily Living Home Assistive Devices/Equipment: Prosthesis, Eyeglasses, CBG Meter, Bedside commode/3-in-1, Wheelchair, Shower chair with back ADL Screening (condition at time of admission) Patient's cognitive ability adequate to safely complete daily activities?: Yes Is the patient deaf or have difficulty hearing?: No Does the patient have difficulty seeing, even when wearing glasses/contacts?: No Does the patient have difficulty concentrating, remembering, or making decisions?: No Patient able to express need for assistance with ADLs?: Yes Does the patient have difficulty dressing or bathing?: No Independently performs ADLs?: No Communication: Independent Dressing (OT): Independent Grooming: Independent Feeding: Independent Bathing: Independent with device (comment) Toileting: Independent with device (comment) In/Out Bed: Needs assistance Is this a change from baseline?: Change from baseline, expected to last <3 days Walks in Home: Needs assistance Is this a change from baseline?: Pre-admission baseline Does the patient have difficulty walking or climbing stairs?: Yes Weakness of Legs: Left Weakness of Arms/Hands: None  Permission Sought/Granted   Permission granted to share information with : No              Emotional Assessment Appearance:: Appears stated age Attitude/Demeanor/Rapport: Engaged Affect (typically observed): Accepting Orientation: : Oriented to Self, Oriented to Place, Oriented to  Time, Oriented to Situation Alcohol / Substance Use: Not Applicable Psych Involvement: No (  comment)  Admission diagnosis:  Wound check, abscess [Z51.89] Cellulitis and abscess of left leg [L03.116, L02.416] Patient Active Problem List    Diagnosis Date Noted   GERD (gastroesophageal reflux disease) 01/24/2023   Hypokalemia 01/24/2023   Cellulitis and abscess of left leg 01/23/2023   History of osteomyelitis 10/12/2022   Uncontrolled type 2 diabetes mellitus with hyperglycemia (HCC) 10/12/2022   CAD (coronary artery disease) 10/12/2022   Facial skin lesion 10/12/2022   Encounter for screening colonoscopy 10/12/2022   Iron deficiency anemia 04/01/2022   S/P BKA (below knee amputation), left (HCC) 10/30/2021   Anxiety 09/30/2020   Preventative health care 12/13/2019   S/P aortic valve replacement with mechanical valve 12/05/2019   Hyperlipidemia associated with type 2 diabetes mellitus (HCC) 10/12/2015   Class 3 obesity (HCC) 10/12/2015   HTN (hypertension) 07/02/2013   PCP:  [name], DO Pharmacy:   [facility] [address], [place] - [address] [place] [address] Phone: [phone] Fax: [phone]     Social Determinants of Health (SDOH) Social History: SDOH Screenings   Food Insecurity: No Food Insecurity (01/24/2023)  Housing: Low Risk  (01/24/2023)  Transportation Needs: No Transportation Needs (01/24/2023)  Utilities: Not At Risk (01/24/2023)  Depression (PHQ2-9): Medium Risk (10/12/2022)  Tobacco Use: Medium Risk (01/27/2023)   SDOH Interventions:     Readmission Risk Interventions     No data to display

## 2023-01-28 ENCOUNTER — Other Ambulatory Visit (HOSPITAL_COMMUNITY): Payer: Self-pay

## 2023-01-28 DIAGNOSIS — L02416 Cutaneous abscess of left lower limb: Secondary | ICD-10-CM | POA: Diagnosis not present

## 2023-01-28 DIAGNOSIS — L03116 Cellulitis of left lower limb: Secondary | ICD-10-CM | POA: Diagnosis not present

## 2023-01-28 LAB — CBC
HCT: 33.9 % — ABNORMAL LOW (ref 39.0–52.0)
Hemoglobin: 10.6 g/dL — ABNORMAL LOW (ref 13.0–17.0)
MCH: 27.9 pg (ref 26.0–34.0)
MCHC: 31.3 g/dL (ref 30.0–36.0)
MCV: 89.2 fL (ref 80.0–100.0)
Platelets: 289 10*3/uL (ref 150–400)
RBC: 3.8 MIL/uL — ABNORMAL LOW (ref 4.22–5.81)
RDW: 13.5 % (ref 11.5–15.5)
WBC: 6.5 10*3/uL (ref 4.0–10.5)
nRBC: 0.3 % — ABNORMAL HIGH (ref 0.0–0.2)

## 2023-01-28 LAB — GLUCOSE, CAPILLARY
Glucose-Capillary: 241 mg/dL — ABNORMAL HIGH (ref 70–99)
Glucose-Capillary: 180 mg/dL — ABNORMAL HIGH (ref 70–99)
Glucose-Capillary: 165 mg/dL — ABNORMAL HIGH (ref 70–99)
Glucose-Capillary: 184 mg/dL — ABNORMAL HIGH (ref 70–99)
Glucose-Capillary: 177 mg/dL — ABNORMAL HIGH (ref 70–99)
Glucose-Capillary: 144 mg/dL — ABNORMAL HIGH (ref 70–99)
Glucose-Capillary: 180 mg/dL — ABNORMAL HIGH (ref 70–99)
Glucose-Capillary: 142 mg/dL — ABNORMAL HIGH (ref 70–99)

## 2023-01-28 LAB — VANCOMYCIN, TROUGH: Vancomycin Tr: 13 ug/mL — ABNORMAL LOW (ref 15–20)

## 2023-01-28 LAB — HEPARIN LEVEL (UNFRACTIONATED): Heparin Unfractionated: 0.47 IU/mL (ref 0.30–0.70)

## 2023-01-28 NOTE — Progress Notes (Addendum)
Patient stable.  No changes. NPO after midnight for anticipated I&D tomorrow possible revision BKA vs closure by Dr. Lajoyce Corners. Will have pharmacy turn off heparin drip at midnight.

## 2023-01-28 NOTE — Progress Notes (Addendum)
ANTICOAGULATION & ANTIBIOTIC CONSULT NOTE - Follow Up Consult  Pharmacy Consult for heparin & vancomycin Indication:  mechanical AVR ; MRSA wound infection  No Known Allergies  Patient Measurements: Height: 5\' 10"  (177.8 cm) Weight: (!) 167.9 kg (370 lb 2.4 oz) IBW/kg (Calculated) : 73 Heparin Dosing Weight: 112.8 kg  Vital Signs: Temp: 98.2 F (36.8 C) (05/03 0757) Temp Source: Oral (05/03 0757) BP: 147/73 (05/03 0757) Pulse Rate: 70 (05/03 0757)  Labs: Recent Labs    01/26/23 0522 01/27/23 1211 01/27/23 2027 01/28/23 0613  HGB 11.0* 10.6*  --  10.6*  HCT 35.2* 33.8*  --  33.9*  PLT 285 289  --  289  HEPARINUNFRC <0.10* <0.10* <0.10* 0.47  CREATININE 0.84  --   --   --     Estimated Creatinine Clearance: 172.5 mL/min (by C-G formula based on SCr of 0.84 mg/dL).  Assessment: 47 yo M who presents with cellulitis and abscess of L BKA stump and has been initiated on broad-spectrum antibiotics. Plan is for surgical I&D on 5/1 with orthopedic surgery. Patient has a mechanical AVR and was taking warfarin 10mg  T/R/Sa/Su and 5mg  on MWF. This regimen was last reviewed in outpt anticoag clinic on 4/9 with therapeutic INR of 2.5. Pt reports last dose of warfarin was ~1 wk prior to admission. INR on admission was 1.3, subtherapeutic. Warfarin was held on admission in anticipation for possible surgery. Pharmacy consulted to dose heparin infusion for mechanical AVR.   Anticoagulation: Patient is s/p I&D on 5/1. Heparin infusion resumed at 03:23 on 5/2 Heparin level therapeutic this AM at 0.47 on current rate of 3200 units/hr.  CBC is stable. No s/sx of bleeding noted  Infectious Disease: Vancomycin peak 31, trough 13 - cAUC = 535 (goal 400-550).      Goal of Therapy:  Heparin level 0.3-0.7 units/ml Monitor platelets by anticoagulation protocol: Yes Vancomycin AUC 400-550   Plan:  Continue heparin infusion to 3200 units/hr Check heparin level in 6 hrs to confirm Daily  heparin level and CBC while on heparin Monitor for signs/symptoms of bleeding F/U plan to return to OR  Continue Vancomycin 1500 mg IV every 12 hours - at goal AUC.  Monitor weekly levels.   Addendum: Heparin to stop at midnight for OR - cancel level and place stop time on order. Will follow-up post OR to resume.   Thank you for involving pharmacy in this patient's care.  Link Snuffer, PharmD, BCPS, BCCCP Clinical Pharmacist Please refer to [facility] for [facility] Pharmacy numbers 01/28/2023 10:23 AM   Please refer to [facility] for pharmacy phone number

## 2023-01-28 NOTE — Progress Notes (Signed)
[facility] Progress Note  Patient: Adam Long     ZOX:[id number]  DOA: 01/23/2023   PCP: [name], DO       Brief hospital course: 47 year old male with type 2 diabetes mellitus, uncontrolled, CAD, hypertension, AVR, dyslipidemia, left BKA with revision x 2, morbid obesity who presents to the hospital for left lower extremity wound caused by his prosthesis along with bleeding.  He stated he stopped wearing his prosthesis about 5 days before.  Has a history of MSSA bacteremia in the setting of a left diabetic foot ulcer in 2023 which was treated with a BKA and IV antibiotics.  Also has a history of E. coli, Proteus mirabilis and E faecalis and a separate infection with-a, E faecalis, Clostridium perfringens and multidrug-resistant Klebsiella pneumonia growing from the wound. CT scan showed findings suggestive of abscesses without osteomyelitis.  S/p irrigation and excisional debridement with wound VAC application 5/1 with plan for repeat washout and closure versus revision amputation by Dr. Lajoyce Corners 5/4.  Coumadin held, on IV heparin and IV antibiotics.   Subjective:  No complaints this morning.  Although had a diet order, not made n.p.o. overnight, patient had voluntarily remained n.p.o. with the impression that he was undergoing the procedure today which was not the case.  Assessment and Plan: Principal Problem: Left BKA MRSA stump abscess -Multiple infections in the past requiring revisions - S/p irrigation and excisional debridement with wound VAC application 5/1 with plan for repeat washout and closure versus revision amputation by Dr. Lajoyce Corners 5/4.  Communicated with orthopedic team in person. -Culture confirms MRSA.  Discontinued meropenem.  Continue vancomycin. - nutrition consulted to assist with nutrition to allow for wound healing  Active Problems:   Uncontrolled type 2 diabetes mellitus with hyperglycemia (HCC) -A1c 13.4-takes metformin at home -Lantus increased to  30 units - resolution of infection appears to have helped glycemic control. - Sliding scale insulin continued - He will need new prescriptions when discharged, including a prescription for the continuous blood glucose monitor.  As per DM coordinator, plan to place CBG monitor on 5/3. -Improved glycemic control with CBGs mostly in the 100s.    HTN (hypertension) -Controlled on metoprolol and lisinopril.    CAD (coronary artery disease) No anginal symptoms.    Hyperlipidemia associated with type 2 diabetes mellitus (HCC) Continue Zetia.    S/P aortic valve replacement with mechanical valve -Remains on heparin infusion pending completion of all procedures.  Coumadin held.  Postprocedure resume Coumadin when cleared by orthopedics.  It may take several days for her INR to be therapeutic for the mechanical valve replacement.  Mild hyponatremia - appears chronically low    Anxiety - Zoloft    GERD (gastroesophageal reflux disease) -Protonix  Hypokalemia - Replaced  Normocytic anemia - Stable  Severe hypoalbuminemia - Albumin level 1.9.  Dietitian consulted.  Body mass index is 53.11 kg/m./Very morbid obesity Complicates care including wound healing from surgery, DM control etc.  ACP documents: None present.   Code Status: Full Code Consultants: orthopedic surgery DVT prophylaxis:  SCDs Start: 01/26/23 1613 SCDs Start: 01/23/23 2131.  Currently on IV heparin drip. Family communication: None at bedside.     Objective:   Vitals:   01/27/23 2005 01/28/23 0441 01/28/23 0757 01/28/23 1604  BP: 130/80 139/75 (!) 147/73 137/72  Pulse: 70 66 70 72  Resp: 18 18 19 17   Temp: 98.5 F (36.9 C) 98.4 F (36.9 C) 98.2 F (36.8 C) 98.1 F (36.7 C)  TempSrc:  Oral Oral Oral Oral  SpO2: 98% 97% 97% 99%  Weight:  (!) 167.9 kg    Height:       Filed Weights   01/26/23 0456 01/27/23 0500 01/28/23 0441  Weight: (!) 163.3 kg (!) 163 kg (!) 167.9 kg   Exam: General exam: Young  male, moderately built and morbidly obese sitting up comfortably in bed without distress. Respiratory system: Clear to auscultation. Respiratory effort normal.  Midline sternotomy scar. Cardiovascular system: S1 & S2 heard, RRR. No JVD, murmurs, rubs, gallops or clicks. No pedal edema.  Not on telemetry. Gastrointestinal system: Abdomen is nondistended, soft and nontender. No organomegaly or masses felt. Normal bowel sounds heard. Central nervous system: Alert and oriented. No focal neurological deficits. Extremities: Symmetric 5 x 5 power.  Left BKA stump site dressing/wound VAC intact without acute findings.  Stable without change. Skin: No rashes, lesions or ulcers Psychiatry: Judgement and insight appear normal. Mood & affect appropriate.       CBC: Recent Labs  Lab 01/23/23 1518 01/24/23 0047 01/25/23 0336 01/26/23 0522 01/27/23 1211 01/28/23 0613  WBC 10.6* 9.2 8.7 8.6 7.7 6.5  NEUTROABS 7.8* 7.2  --   --   --   --   HGB 10.8* 10.5* 11.3* 11.0* 10.6* 10.6*  HCT 33.3* 31.4* 34.4* 35.2* 33.8* 33.9*  MCV 87.2 85.1 87.1 89.1 89.9 89.2  PLT 261 249 255 285 289 289   Basic Metabolic Panel: Recent Labs  Lab 01/23/23 1518 01/24/23 0047 01/25/23 0336 01/26/23 0522  NA 128* 129* 130* 130*  K 3.5 3.3* 4.6 4.3  CL 96* 97* 99 99  CO2 19* 24 24 25   GLUCOSE 450* 362* 336* 306*  BUN 18 13 13 10   CREATININE 0.82 0.84 0.88 0.84  CALCIUM 8.4* 8.2* 8.3* 8.2*  MG  --  1.9  --  1.9  PHOS  --   --   --  3.5   GFR: Estimated Creatinine Clearance: 172.5 mL/min (by C-G formula based on SCr of 0.84 mg/dL).  Scheduled Meds:  docusate sodium  100 mg Oral BID   ezetimibe  10 mg Oral Daily   gabapentin  600 mg Oral BID   insulin aspart  0-15 Units Subcutaneous Q4H   insulin glargine-yfgn  30 Units Subcutaneous Daily   lisinopril  20 mg Oral Daily   metoprolol tartrate  25 mg Oral BID   multivitamin with minerals  1 tablet Oral Daily   nutrition supplement (JUVEN)  1 packet Oral BID  BM   nystatin cream   Topical BID   pantoprazole  40 mg Oral Daily   sertraline  50 mg Oral Daily   Continuous Infusions:  sodium chloride 125 mL/hr at 01/28/23 1414   heparin 3,200 Units/hr (01/28/23 1412)   methocarbamol (ROBAXIN) IV     vancomycin 1,500 mg (01/28/23 0631)   Imaging and lab data was personally reviewed No results found.  LOS: 5 days   Marcellus Scott, MD,  FACP, [facility], [facility], [facility], [facility]   [facility]ician Advisor [place]     To contact the attending provider between 7A-7P or the covering provider during after hours 7P-7A, please log into the web site [email or web address] and access using universal [place] password for that web site. If you do not have the password, please call the hospital operator.

## 2023-01-28 NOTE — H&P (View-Only) (Signed)
Patient stable.  No changes. NPO after midnight for anticipated I&D tomorrow possible revision BKA vs closure by Dr. Duda. Will have pharmacy turn off heparin drip at midnight. 

## 2023-01-28 NOTE — Inpatient Diabetes Management (Addendum)
Inpatient Diabetes Program Recommendations  AACE/ADA: New Consensus Statement on Inpatient Glycemic Control (2015)  Target Ranges:  Prepandial:   less than 140 mg/dL      Peak postprandial:   less than 180 mg/dL (1-2 hours)      Critically ill patients:  140 - 180 mg/dL   Lab Results  Component Value Date   GLUCAP 144 (H) 01/28/2023   HGBA1C 13.4 (H) 01/24/2023    Review of Glycemic Control  Diabetes history: type 2 Outpatient Diabetes medications: Metformin 1000 mg BID Current orders for Inpatient glycemic control: Semglee 30 units daily, Novolog 0-15 units correction scale every 4 hours.  Inpatient Diabetes Program Recommendations:   Spoke with patient at the bedside. Gave patient a Freestyle Libre 3 continuous glucose monitor to put on after discharge since he is having surgery tomorrow. Recommended that he go ahead and add the [place] 3 app on his phone and set up his account. He can then start his CGM at home when discharged.   Patient has taken insulin before using the insulin pen. Recommend Lantus Solostar insulin pen 35 units daily at discharge. Follow up with PCP for glucose control and adjustment of insulin dosage.   Discharge Recommendations: Long acting recommendations: Insulin Glargine (LANTUS) Solostar Pen 35 units daily  Supply/Referral recommendations: Pen needles - standard Freestyle Libre 3 sensor order # [id number], Dorathy Daft 3 Reader order # [phone]  Smith Mince RN BSN CDE Diabetes Coordinator Pager: [phone]  8am-5pm

## 2023-01-29 ENCOUNTER — Inpatient Hospital Stay (HOSPITAL_COMMUNITY): Payer: Medicaid Other | Admitting: Anesthesiology

## 2023-01-29 ENCOUNTER — Other Ambulatory Visit: Payer: Self-pay

## 2023-01-29 ENCOUNTER — Encounter (HOSPITAL_COMMUNITY)
Admission: EM | Disposition: A | Discharge status: Home / Self Care | Payer: Self-pay | Source: Home / Self Care | Attending: Internal Medicine

## 2023-01-29 ENCOUNTER — Encounter (HOSPITAL_COMMUNITY): Payer: Self-pay | Admitting: Family Medicine

## 2023-01-29 DIAGNOSIS — I350 Nonrheumatic aortic (valve) stenosis: Secondary | ICD-10-CM

## 2023-01-29 DIAGNOSIS — T8781 Dehiscence of amputation stump: Secondary | ICD-10-CM

## 2023-01-29 DIAGNOSIS — I10 Essential (primary) hypertension: Secondary | ICD-10-CM

## 2023-01-29 DIAGNOSIS — L02416 Cutaneous abscess of left lower limb: Secondary | ICD-10-CM | POA: Diagnosis not present

## 2023-01-29 DIAGNOSIS — I2510 Atherosclerotic heart disease of native coronary artery without angina pectoris: Secondary | ICD-10-CM

## 2023-01-29 DIAGNOSIS — L03116 Cellulitis of left lower limb: Secondary | ICD-10-CM | POA: Diagnosis not present

## 2023-01-29 HISTORY — PX: AMPUTATION: SHX166

## 2023-01-29 LAB — CBC
HCT: 31.8 % — ABNORMAL LOW (ref 39.0–52.0)
Hemoglobin: 10.1 g/dL — ABNORMAL LOW (ref 13.0–17.0)
MCH: 28.1 pg (ref 26.0–34.0)
MCHC: 31.8 g/dL (ref 30.0–36.0)
MCV: 88.6 fL (ref 80.0–100.0)
Platelets: 278 10*3/uL (ref 150–400)
RBC: 3.59 MIL/uL — ABNORMAL LOW (ref 4.22–5.81)
RDW: 13.6 % (ref 11.5–15.5)
WBC: 7.6 10*3/uL (ref 4.0–10.5)
nRBC: 0 % (ref 0.0–0.2)

## 2023-01-29 LAB — GLUCOSE, CAPILLARY
Glucose-Capillary: 133 mg/dL — ABNORMAL HIGH (ref 70–99)
Glucose-Capillary: 128 mg/dL — ABNORMAL HIGH (ref 70–99)
Glucose-Capillary: 126 mg/dL — ABNORMAL HIGH (ref 70–99)
Glucose-Capillary: 132 mg/dL — ABNORMAL HIGH (ref 70–99)
Glucose-Capillary: 147 mg/dL — ABNORMAL HIGH (ref 70–99)
Glucose-Capillary: 140 mg/dL — ABNORMAL HIGH (ref 70–99)

## 2023-01-29 LAB — HEPARIN LEVEL (UNFRACTIONATED)
Heparin Unfractionated: <0.1 IU/mL — ABNORMAL LOW (ref 0.30–0.70)
Heparin Unfractionated: <0.1 IU/mL — ABNORMAL LOW (ref 0.30–0.70)

## 2023-01-29 LAB — AEROBIC/ANAEROBIC CULTURE W GRAM STAIN (SURGICAL/DEEP WOUND)

## 2023-01-29 SURGERY — AMPUTATION BELOW KNEE
Anesthesia: General | Site: Knee | Laterality: Left

## 2023-01-29 MED ORDER — EPHEDRINE SULFATE-NACL 50-0.9 MG/10ML-% IV SOSY
PREFILLED_SYRINGE | INTRAVENOUS | Status: DC | PRN
  Administered 2023-01-29 (×3): 5 mg via INTRAVENOUS

## 2023-01-29 MED ORDER — ACETAMINOPHEN 10 MG/ML IV SOLN
1000.0000 mg | Freq: Once | INTRAVENOUS | Status: DC | PRN
  Administered 2023-01-29: 1000 mg via INTRAVENOUS

## 2023-01-29 MED ORDER — OXYCODONE HCL 5 MG/5ML PO SOLN: 5.0000 mg | Freq: Once | ORAL | Status: AC | PRN

## 2023-01-29 MED ORDER — WARFARIN - PHARMACIST DOSING INPATIENT: Freq: Every day | Status: DC

## 2023-01-29 MED ORDER — VANCOMYCIN HCL 1000 MG IV SOLR
INTRAVENOUS | Status: DC | PRN
  Administered 2023-01-29: 1000 mg via TOPICAL

## 2023-01-29 MED ORDER — MIDAZOLAM HCL 2 MG/2ML IJ SOLN
INTRAMUSCULAR | Status: AC
  Filled 2023-01-29: qty 2

## 2023-01-29 MED ORDER — CHLORHEXIDINE GLUCONATE 0.12 % MT SOLN
OROMUCOSAL | Status: AC
  Administered 2023-01-29: 15 mL via OROMUCOSAL
  Filled 2023-01-29: qty 15

## 2023-01-29 MED ORDER — PANTOPRAZOLE SODIUM 40 MG PO TBEC: 40.0000 mg | DELAYED_RELEASE_TABLET | Freq: Every day | ORAL | Status: DC

## 2023-01-29 MED ORDER — LACTATED RINGERS IV SOLN: INTRAVENOUS | Status: DC

## 2023-01-29 MED ORDER — CEFAZOLIN IN SODIUM CHLORIDE 3-0.9 GM/100ML-% IV SOLN
3.0000 g | INTRAVENOUS | Status: DC
  Filled 2023-01-29: qty 100

## 2023-01-29 MED ORDER — 0.9 % SODIUM CHLORIDE (POUR BTL) OPTIME
TOPICAL | Status: DC | PRN
  Administered 2023-01-29: 1000 mL

## 2023-01-29 MED ORDER — PROPOFOL 10 MG/ML IV BOLUS
INTRAVENOUS | Status: DC | PRN
  Administered 2023-01-29: 150 mg via INTRAVENOUS
  Administered 2023-01-29: 50 mg via INTRAVENOUS

## 2023-01-29 MED ORDER — VITAMIN C 500 MG PO TABS
1000.0000 mg | ORAL_TABLET | Freq: Every day | ORAL | Status: DC
  Administered 2023-01-29 – 2023-02-03 (×6): 1000 mg via ORAL
  Filled 2023-01-29 (×6): qty 2

## 2023-01-29 MED ORDER — PROMETHAZINE HCL 25 MG/ML IJ SOLN: 6.2500 mg | INTRAMUSCULAR | Status: DC | PRN

## 2023-01-29 MED ORDER — ONDANSETRON HCL 4 MG/2ML IJ SOLN
4.0000 mg | Freq: Four times a day (QID) | INTRAMUSCULAR | Status: DC | PRN
Start: 2023-01-29 — End: 2023-01-29

## 2023-01-29 MED ORDER — AMISULPRIDE (ANTIEMETIC) 5 MG/2ML IV SOLN: 10.0000 mg | Freq: Once | INTRAVENOUS | Status: DC | PRN

## 2023-01-29 MED ORDER — ALUM & MAG HYDROXIDE-SIMETH 200-200-20 MG/5ML PO SUSP: 15.0000 mL | ORAL | Status: DC | PRN

## 2023-01-29 MED ORDER — MEPERIDINE HCL 25 MG/ML IJ SOLN: 6.2500 mg | INTRAMUSCULAR | Status: DC | PRN

## 2023-01-29 MED ORDER — METOPROLOL TARTRATE 5 MG/5ML IV SOLN: 2.0000 mg | INTRAVENOUS | Status: DC | PRN

## 2023-01-29 MED ORDER — ORAL CARE MOUTH RINSE: 15.0000 mL | Freq: Once | OROMUCOSAL | Status: AC

## 2023-01-29 MED ORDER — HYDROMORPHONE HCL 1 MG/ML IJ SOLN
0.5000 mg | INTRAMUSCULAR | Status: DC | PRN
  Administered 2023-01-29 – 2023-02-02 (×8): 1 mg via INTRAVENOUS
  Filled 2023-01-29 (×8): qty 1

## 2023-01-29 MED ORDER — ZINC SULFATE 220 (50 ZN) MG PO CAPS
220.0000 mg | ORAL_CAPSULE | Freq: Every day | ORAL | Status: DC
  Administered 2023-01-29 – 2023-02-03 (×6): 220 mg via ORAL
  Filled 2023-01-29 (×6): qty 1

## 2023-01-29 MED ORDER — JUVEN PO PACK
1.0000 | PACK | Freq: Two times a day (BID) | ORAL | Status: DC
Start: 2023-01-29 — End: 2023-01-29

## 2023-01-29 MED ORDER — ACETAMINOPHEN 325 MG PO TABS
325.0000 mg | ORAL_TABLET | Freq: Four times a day (QID) | ORAL | Status: DC | PRN
  Administered 2023-01-31: 650 mg via ORAL
  Filled 2023-01-29: qty 2

## 2023-01-29 MED ORDER — INSULIN ASPART 100 UNIT/ML IJ SOLN
0.0000 [IU] | Freq: Three times a day (TID) | INTRAMUSCULAR | Status: DC
  Administered 2023-01-29: 2 [IU] via SUBCUTANEOUS
  Administered 2023-01-30: 3 [IU] via SUBCUTANEOUS
  Administered 2023-01-30: 2 [IU] via SUBCUTANEOUS
  Administered 2023-01-31 (×2): 3 [IU] via SUBCUTANEOUS
  Administered 2023-01-31: 2 [IU] via SUBCUTANEOUS
  Administered 2023-02-01: 1 [IU] via SUBCUTANEOUS
  Administered 2023-02-01: 5 [IU] via SUBCUTANEOUS
  Administered 2023-02-01: 2 [IU] via SUBCUTANEOUS
  Administered 2023-02-02: 3 [IU] via SUBCUTANEOUS
  Administered 2023-02-02: 2 [IU] via SUBCUTANEOUS
  Administered 2023-02-02 – 2023-02-03 (×2): 3 [IU] via SUBCUTANEOUS
  Administered 2023-02-03: 2 [IU] via SUBCUTANEOUS

## 2023-01-29 MED ORDER — CEFAZOLIN SODIUM-DEXTROSE 2-4 GM/100ML-% IV SOLN
INTRAVENOUS | Status: AC
  Filled 2023-01-29: qty 100

## 2023-01-29 MED ORDER — CHLORHEXIDINE GLUCONATE 0.12 % MT SOLN: 15.0000 mL | Freq: Once | OROMUCOSAL | Status: AC

## 2023-01-29 MED ORDER — CEFAZOLIN SODIUM-DEXTROSE 2-4 GM/100ML-% IV SOLN
2.0000 g | Freq: Three times a day (TID) | INTRAVENOUS | Status: AC
  Administered 2023-01-29 (×2): 2 g via INTRAVENOUS
  Filled 2023-01-29 (×2): qty 100

## 2023-01-29 MED ORDER — ACETAMINOPHEN 10 MG/ML IV SOLN
INTRAVENOUS | Status: AC
  Filled 2023-01-29: qty 100

## 2023-01-29 MED ORDER — INSULIN ASPART 100 UNIT/ML IJ SOLN: 0.0000 [IU] | Freq: Every day | INTRAMUSCULAR | Status: DC

## 2023-01-29 MED ORDER — HEPARIN (PORCINE) 25000 UT/250ML-% IV SOLN
3350.0000 [IU]/h | INTRAVENOUS | Status: DC
  Administered 2023-01-29: 3200 [IU]/h via INTRAVENOUS
  Administered 2023-01-29 – 2023-02-02 (×10): 3450 [IU]/h via INTRAVENOUS
  Administered 2023-02-02 – 2023-02-03 (×3): 3350 [IU]/h via INTRAVENOUS
  Filled 2023-01-29 (×14): qty 250

## 2023-01-29 MED ORDER — FENTANYL CITRATE (PF) 250 MCG/5ML IJ SOLN
INTRAMUSCULAR | Status: AC
  Filled 2023-01-29: qty 5

## 2023-01-29 MED ORDER — PHENOL 1.4 % MT LIQD: 1.0000 | OROMUCOSAL | Status: DC | PRN

## 2023-01-29 MED ORDER — HYDROMORPHONE HCL 1 MG/ML IJ SOLN: 0.2500 mg | INTRAMUSCULAR | Status: DC | PRN

## 2023-01-29 MED ORDER — BISACODYL 5 MG PO TBEC: 5.0000 mg | DELAYED_RELEASE_TABLET | Freq: Every day | ORAL | Status: DC | PRN

## 2023-01-29 MED ORDER — SODIUM CHLORIDE 0.9 % IV SOLN: INTRAVENOUS | Status: DC

## 2023-01-29 MED ORDER — HYDRALAZINE HCL 20 MG/ML IJ SOLN: 5.0000 mg | INTRAMUSCULAR | Status: DC | PRN

## 2023-01-29 MED ORDER — DOCUSATE SODIUM 100 MG PO CAPS
100.0000 mg | ORAL_CAPSULE | Freq: Every day | ORAL | Status: DC
Start: 2023-01-30 — End: 2023-01-29

## 2023-01-29 MED ORDER — MAGNESIUM SULFATE 2 GM/50ML IV SOLN: 2.0000 g | Freq: Every day | INTRAVENOUS | Status: DC | PRN

## 2023-01-29 MED ORDER — HYDROMORPHONE HCL 1 MG/ML IJ SOLN
INTRAMUSCULAR | Status: AC
  Filled 2023-01-29: qty 1

## 2023-01-29 MED ORDER — FENTANYL CITRATE (PF) 250 MCG/5ML IJ SOLN
INTRAMUSCULAR | Status: DC | PRN
  Administered 2023-01-29 (×6): 25 ug via INTRAVENOUS

## 2023-01-29 MED ORDER — POTASSIUM CHLORIDE CRYS ER 20 MEQ PO TBCR: 20.0000 meq | EXTENDED_RELEASE_TABLET | Freq: Every day | ORAL | Status: DC | PRN

## 2023-01-29 MED ORDER — OXYCODONE HCL 5 MG PO TABS
10.0000 mg | ORAL_TABLET | ORAL | Status: DC | PRN
  Administered 2023-01-30 – 2023-02-01 (×4): 15 mg via ORAL
  Filled 2023-01-29 (×4): qty 3

## 2023-01-29 MED ORDER — OXYCODONE HCL 5 MG PO TABS
ORAL_TABLET | ORAL | Status: AC
  Filled 2023-01-29: qty 1

## 2023-01-29 MED ORDER — ACETAMINOPHEN 325 MG PO TABS: 325.0000 mg | ORAL_TABLET | Freq: Once | ORAL | Status: DC | PRN

## 2023-01-29 MED ORDER — ONDANSETRON HCL 4 MG/2ML IJ SOLN
INTRAMUSCULAR | Status: DC | PRN
  Administered 2023-01-29: 4 mg via INTRAVENOUS

## 2023-01-29 MED ORDER — HYDROMORPHONE HCL 1 MG/ML IJ SOLN
0.5000 mg | INTRAMUSCULAR | Status: AC | PRN
  Administered 2023-01-29 (×4): 0.5 mg via INTRAVENOUS

## 2023-01-29 MED ORDER — GUAIFENESIN-DM 100-10 MG/5ML PO SYRP: 15.0000 mL | ORAL_SOLUTION | ORAL | Status: DC | PRN

## 2023-01-29 MED ORDER — OXYCODONE HCL 5 MG PO TABS
5.0000 mg | ORAL_TABLET | Freq: Once | ORAL | Status: AC | PRN
  Administered 2023-01-29: 5 mg via ORAL

## 2023-01-29 MED ORDER — OXYCODONE HCL 5 MG PO TABS
5.0000 mg | ORAL_TABLET | ORAL | Status: DC | PRN
  Administered 2023-01-29 – 2023-01-30 (×2): 10 mg via ORAL
  Administered 2023-01-31: 5 mg via ORAL
  Filled 2023-01-29 (×3): qty 2

## 2023-01-29 MED ORDER — VANCOMYCIN HCL 1000 MG IV SOLR
INTRAVENOUS | Status: AC
  Filled 2023-01-29: qty 20

## 2023-01-29 MED ORDER — WARFARIN SODIUM 10 MG PO TABS
10.0000 mg | ORAL_TABLET | Freq: Once | ORAL | Status: AC
  Administered 2023-01-29: 10 mg via ORAL
  Filled 2023-01-29: qty 1

## 2023-01-29 MED ORDER — LIDOCAINE 2% (20 MG/ML) 5 ML SYRINGE
INTRAMUSCULAR | Status: DC | PRN
  Administered 2023-01-29: 60 mg via INTRAVENOUS

## 2023-01-29 MED ORDER — ACETAMINOPHEN 160 MG/5ML PO SOLN: 325.0000 mg | Freq: Once | ORAL | Status: DC | PRN

## 2023-01-29 MED ORDER — POLYETHYLENE GLYCOL 3350 17 G PO PACK
17.0000 g | PACK | Freq: Every day | ORAL | Status: DC | PRN
Start: 2023-01-29 — End: 2023-01-29

## 2023-01-29 MED ORDER — MAGNESIUM CITRATE PO SOLN: 1.0000 | Freq: Once | ORAL | Status: DC | PRN

## 2023-01-29 MED ORDER — LABETALOL HCL 5 MG/ML IV SOLN: 10.0000 mg | INTRAVENOUS | Status: DC | PRN

## 2023-01-29 SURGICAL SUPPLY — 46 items
BAG COUNTER SPONGE SURGICOUNT (BAG) IMPLANT
BAG SPNG CNTER NS LX DISP (BAG)
BIT DRILL 3.2XOCPTL (BIT) ×1 IMPLANT
BIT DRL 3.2XOCPTL (BIT)
BLADE SAW RECIP 87.9 MT (BLADE) ×1 IMPLANT
BLADE SURG 21 STRL SS (BLADE) ×1 IMPLANT
BNDG CMPR 5X6 CHSV STRCH STRL (GAUZE/BANDAGES/DRESSINGS) ×1
BNDG COHESIVE 6X5 TAN ST LF (GAUZE/BANDAGES/DRESSINGS) IMPLANT
CANISTER WOUND CARE 500ML ATS (WOUND CARE) ×1 IMPLANT
COVER SURGICAL LIGHT HANDLE (MISCELLANEOUS) ×1 IMPLANT
CUFF TOURN SGL QUICK 34 (TOURNIQUET CUFF) ×1
CUFF TRNQT CYL 34X4.125X (TOURNIQUET CUFF) ×1 IMPLANT
DRAPE DERMATAC (DRAPES) IMPLANT
DRAPE INCISE IOBAN 66X45 STRL (DRAPES) ×1 IMPLANT
DRAPE U-SHAPE 47X51 STRL (DRAPES) ×1 IMPLANT
DRESSING PREVENA PLUS CUSTOM (GAUZE/BANDAGES/DRESSINGS) ×1 IMPLANT
DRESSING RESTOR ADAPTIFORM 49 (GAUZE/BANDAGES/DRESSINGS) IMPLANT
DRILL BIT (BIT)
DRSG PREVENA PLUS CUSTOM (GAUZE/BANDAGES/DRESSINGS) ×1
DRSG RESTOR ADAPTIFORM 49 (GAUZE/BANDAGES/DRESSINGS) ×1
DURAPREP 26ML APPLICATOR (WOUND CARE) ×1 IMPLANT
ELECT REM PT RETURN 9FT ADLT (ELECTROSURGICAL) ×1
ELECTRODE REM PT RTRN 9FT ADLT (ELECTROSURGICAL) ×1 IMPLANT
GLOVE BIOGEL PI IND STRL 9 (GLOVE) ×1 IMPLANT
GLOVE SURG ORTHO 9.0 STRL STRW (GLOVE) ×1 IMPLANT
GOWN STRL REUS W/ TWL XL LVL3 (GOWN DISPOSABLE) ×2 IMPLANT
GOWN STRL REUS W/TWL XL LVL3 (GOWN DISPOSABLE) ×2
GRAFT SKIN WND MICRO 38 (Tissue) IMPLANT
KIT BASIN OR (CUSTOM PROCEDURE TRAY) ×1 IMPLANT
KIT TURNOVER KIT B (KITS) ×1 IMPLANT
MANIFOLD NEPTUNE II (INSTRUMENTS) ×1 IMPLANT
NS IRRIG 1000ML POUR BTL (IV SOLUTION) ×1 IMPLANT
PACK ORTHO EXTREMITY (CUSTOM PROCEDURE TRAY) ×1 IMPLANT
PAD ARMBOARD 7.5X6 YLW CONV (MISCELLANEOUS) ×1 IMPLANT
PREVENA RESTOR ARTHOFORM 46X30 (CANNISTER) ×1 IMPLANT
SPONGE T-LAP 18X18 ~~LOC~~+RFID (SPONGE) IMPLANT
STAPLER VISISTAT 35W (STAPLE) IMPLANT
STOCKINETTE IMPERVIOUS LG (DRAPES) ×1 IMPLANT
SUT ETHILON 2 0 PSLX (SUTURE) IMPLANT
SUT ETHILON 2 LR (SUTURE) IMPLANT
SUT SILK 2 0 (SUTURE) ×1
SUT SILK 2-0 18XBRD TIE 12 (SUTURE) ×1 IMPLANT
SUT VIC AB 1 CTX 27 (SUTURE) ×2 IMPLANT
TOWEL GREEN STERILE (TOWEL DISPOSABLE) ×1 IMPLANT
TUBE CONNECTING 12X1/4 (SUCTIONS) ×1 IMPLANT
YANKAUER SUCT BULB TIP NO VENT (SUCTIONS) ×1 IMPLANT

## 2023-01-29 NOTE — Interval H&P Note (Signed)
History and Physical Interval Note:  01/29/2023 7:57 AM  Adam Long  has presented today for surgery, with the diagnosis of LEFT OPEN BKA.  The various methods of treatment have been discussed with the patient and family. After consideration of risks, benefits and other options for treatment, the patient has consented to  Procedure(s): LEFT AMPUTATION BELOW KNEE REVISION VS CLOSURE (Left) as a surgical intervention.  The patient's history has been reviewed, patient examined, no change in status, stable for surgery.  I have reviewed the patient's chart and labs.  Questions were answered to the patient's satisfaction.     Nadara Mustard

## 2023-01-29 NOTE — Progress Notes (Signed)
ANTICOAGULATION CONSULT NOTE - Follow Up Consult  Pharmacy Consult for heparin Indication:  mechanical AVR  No Known Allergies  Patient Measurements: Height: 5\' 10"  (177.8 cm) Weight: (!) 167.9 kg (370 lb 2.4 oz) IBW/kg (Calculated) : 73 Heparin Dosing Weight: 112.8 kg  Vital Signs: Temp: 97.5 F (36.4 C) (05/04 1245) Temp Source: Oral (05/04 0818) BP: 155/72 (05/04 1245) Pulse Rate: 69 (05/04 1245)  Labs: Recent Labs    01/27/23 1211 01/27/23 2027 01/28/23 0613 01/29/23 0112  HGB 10.6*  --  10.6* 10.1*  HCT 33.8*  --  33.9* 31.8*  PLT 289  --  289 278  HEPARINUNFRC <0.10* <0.10* 0.47 <0.10*     Estimated Creatinine Clearance: 172.5 mL/min (by C-G formula based on SCr of 0.84 mg/dL).  Assessment: 47 yo M who presents with cellulitis and abscess of L BKA stump and has been initiated on broad-spectrum antibiotics. Plan is for surgical I&D on 5/1 with orthopedic surgery. Patient has a mechanical AVR and was taking warfarin 10mg  T/R/Sa/Su and 5mg  on MWF. This regimen was last reviewed in outpt anticoag clinic on 4/9 with therapeutic INR of 2.5. Pt reports last dose of warfarin was ~1 wk prior to admission. INR on admission was 1.3, subtherapeutic. Warfarin was held on admission in anticipation for possible surgery. Pharmacy consulted to dose heparin infusion for mechanical AVR.   Patient is s/p I&D on 5/1. Heparin infusion resumed at 03:23 on 5/2. Heparin was therapeutic at 3200 units/hr and was held at midnight for OR. Resuming heparin bridge with warfarin and restarting home dose. CBC is stable. No s/sx of bleeding noted.   Goal of Therapy:  Heparin level 0.3-0.7 units/ml Monitor platelets by anticoagulation protocol: Yes   Plan:  Continue heparin infusion 3200 units/hr Check heparin level in 6 hrs to confirm Daily heparin level, INR, and CBC while on heparin and warfarin Start warfarin 10 mg today based on PTA dose  Monitor for signs/symptoms of bleeding F/U plan to  discharge to transition bridge to lovenox.   Thank you for involving pharmacy in this patient's care.  March Rummage, PharmD PGY2 Pharmacy Resident  Please check AMION for all [facility] pharmacy phone numbers After 10:00 PM call main pharmacy [phone]

## 2023-01-29 NOTE — Op Note (Signed)
01/29/2023  11:42 AM  PATIENT:  Adam Long    PRE-OPERATIVE DIAGNOSIS: Dehiscence left below-knee amputation POST-OPERATIVE DIAGNOSIS:  Same  PROCEDURE:  LEFT AMPUTATION BELOW KNEE REVISION Application of vancomycin powder 1 g. Application Kerecis micro graft 38 cm. Application of Prevena customizable and Merton Border form wound VAC.  SURGEON:  Nadara Mustard, MD  PHYSICIAN ASSISTANT:None ANESTHESIA:   General  PREOPERATIVE INDICATIONS:  Adam Long is a  47 y.o. male with a diagnosis of LEFT OPEN BELOW KNEE AMPUTATION who failed conservative measures and elected for surgical management.    The risks benefits and alternatives were discussed with the patient preoperatively including but not limited to the risks of infection, bleeding, nerve injury, cardiopulmonary complications, the need for revision surgery, among others, and the patient was willing to proceed.  OPERATIVE IMPLANTS:   Implant Name Type Inv. Item Serial No. Manufacturer Lot No. LRB No. Used Action  GRAFT SKIN WND MICRO 38 - [id number] Tissue GRAFT SKIN WND MICRO 38  [employer] [id number] Left 1 Implanted    @ENCIMAGES @  OPERATIVE FINDINGS: Tissue margins were clear no abscess.  No evidence of bone involvement.  OPERATIVE PROCEDURE: Patient was brought the operating room and underwent a general anesthetic.  After adequate levels anesthesia obtained patient's left lower extremity was prepped using DuraPrep draped into a sterile field a timeout was called.  Elliptical incision was made around the previous wound debridement.  This left a wound that was 15 x 10 cm.  The tissue margins were clear the bone was not involved.  A 21 blade knife and rondure were used for further soft tissue debridement.  Electrocautery was used hemostasis.  The wound was irrigated with normal saline.  The wound bed was filled with 38 cm of Kerecis micro graft and 1 g of vancomycin powder.  The incision was closed with #2 nylon.  A Prevena  customizable and Merton Border form wound VAC were applied this had a good suction fit patient was extubated taken the PACU in stable condition.   DISCHARGE PLANNING:  Antibiotic duration: Continue antibiotics for 24 hours  Weightbearing: Nonweightbearing on the left  Pain medication: Opioid pathway  Dressing care/ Wound VAC: Continue wound VAC for 1 week at discharge  Ambulatory devices: Walker  Discharge to: Anticipate discharge to home.  Follow-up: In the office 1 week post operative.

## 2023-01-29 NOTE — Transfer of Care (Signed)
Immediate Anesthesia Transfer of Care Note  Patient: Adam Long  Procedure(s) Performed: LEFT AMPUTATION BELOW KNEE REVISION AND CLOSURE (Left: Knee)  Patient Location: PACU  Anesthesia Type:General  Level of Consciousness: awake, alert , oriented, and patient cooperative  Airway & Oxygen Therapy: Patient Spontanous Breathing  Post-op Assessment: Report given to RN and Post -op Vital signs reviewed and stable  Post vital signs: Reviewed and stable  Last Vitals:  Vitals Value Taken Time  BP 110/61 01/29/23 1146  Temp 36.7 C 01/29/23 1145  Pulse 78 01/29/23 1150  Resp 17 01/29/23 1150  SpO2 92 % 01/29/23 1150  Vitals shown include unvalidated device data.  Last Pain:  Vitals:   01/29/23 0818  TempSrc: Oral  PainSc:       Patients Stated Pain Goal: 0 (01/25/23 1432)  Complications: No notable events documented.

## 2023-01-29 NOTE — Anesthesia Preprocedure Evaluation (Addendum)
Anesthesia Evaluation  Patient identified by MRN, date of birth, ID band Patient awake    Reviewed: Allergy & Precautions, NPO status , Patient's Chart, lab work & pertinent test results  Airway Mallampati: III  TM Distance: >3 FB Neck ROM: Full    Dental  (+) Edentulous Upper, Edentulous Lower   Pulmonary neg pulmonary ROS   breath sounds clear to auscultation       Cardiovascular hypertension, Pt. on medications and Pt. on home beta blockers + CAD  + Valvular Problems/Murmurs AS  Rhythm:Regular Rate:Normal  - s/p AVR  Echo:  1. Left ventricular ejection fraction, by estimation, is 60 to 65%. The  left ventricle has normal function. The left ventricle has no regional  wall motion abnormalities.   2. Right ventricular systolic function is normal. The right ventricular  size is mildly enlarged.   3. No left atrial/left atrial appendage thrombus was detected.   4. The mitral valve is normal in structure. Trivial mitral valve  regurgitation.   5. There is a 25 mm bileaflet valve present in the aortic position      Aortic valve regurgitation is not visualized. Echo findings are  consistent with normal structure and function of the aortic valve  prosthesis. Aortic valve mean gradient measures 13.0 mmHg.     Neuro/Psych  PSYCHIATRIC DISORDERS Anxiety Depression    negative neurological ROS     GI/Hepatic Neg liver ROS,GERD  Medicated,,  Endo/Other  diabetes, Type 2, Oral Hypoglycemic Agents    Renal/GU negative Renal ROS     Musculoskeletal negative musculoskeletal ROS (+)    Abdominal   Peds  Hematology   Anesthesia Other Findings   Reproductive/Obstetrics                             Anesthesia Physical Anesthesia Plan  ASA: 3  Anesthesia Plan: General   Post-op Pain Management: Gabapentin PO (pre-op)* and Ofirmev IV (intra-op)*   Induction: Intravenous  PONV Risk Score and Plan:  3 and Ondansetron, Dexamethasone and Midazolam  Airway Management Planned: LMA  Additional Equipment: None  Intra-op Plan:   Post-operative Plan: Extubation in OR  Informed Consent: I have reviewed the patients History and Physical, chart, labs and discussed the procedure including the risks, benefits and alternatives for the proposed anesthesia with the patient or authorized representative who has indicated his/her understanding and acceptance.     Dental advisory given  Plan Discussed with: CRNA  Anesthesia Plan Comments:        Anesthesia Quick Evaluation

## 2023-01-29 NOTE — Anesthesia Procedure Notes (Signed)
Procedure Name: LMA Insertion Date/Time: 01/29/2023 10:52 AM  Performed by: Sonda Primes, CRNAPre-anesthesia Checklist: Patient identified, Emergency Drugs available, Suction available and Patient being monitored Patient Re-evaluated:Patient Re-evaluated prior to induction Oxygen Delivery Method: Circle System Utilized Preoxygenation: Pre-oxygenation with 100% oxygen Induction Type: IV induction Ventilation: Mask ventilation without difficulty LMA: LMA inserted LMA Size: 5.0 Number of attempts: 1 Airway Equipment and Method: Bite block Placement Confirmation: positive ETCO2 Tube secured with: Tape Dental Injury: Teeth and Oropharynx as per pre-operative assessment

## 2023-01-29 NOTE — Progress Notes (Signed)
[facility] Progress Note  Patient: Adam Long     WUJ:[id number]  DOA: 01/23/2023   PCP: [name], DO       Brief hospital course: 47 year old male with type 2 diabetes mellitus, uncontrolled, CAD, hypertension, AVR, dyslipidemia, left BKA with revision x 2, morbid obesity who presents to the hospital for left lower extremity wound caused by his prosthesis along with bleeding.  He stated he stopped wearing his prosthesis about 5 days before.  Has a history of MSSA bacteremia in the setting of a left diabetic foot ulcer in 2023 which was treated with a BKA and IV antibiotics.  Also has a history of E. coli, Proteus mirabilis and E faecalis and a separate infection with-a, E faecalis, Clostridium perfringens and multidrug-resistant Klebsiella pneumonia growing from the wound. CT scan showed findings suggestive of abscesses without osteomyelitis.  S/p irrigation and excisional debridement with wound VAC application 5/1.  S/p left BKA revision by Dr. Lajoyce Corners 5/4.  Postop resumed IV heparin bridging and restarted warfarin per pharmacy.   Subjective:  Seen this morning prior to surgery.  No complaints reported.  Assessment and Plan: Principal Problem: Left BKA MRSA stump abscess -Multiple infections in the past requiring revisions - S/p irrigation and excisional debridement with wound VAC application 5/1 - S/p left BKA revision by Dr. Lajoyce Corners 5/4.  -Culture confirms MRSA.  Discontinued meropenem.  Continue vancomycin. - nutrition consulted to assist with nutrition to allow for wound healing  Active Problems:   Uncontrolled type 2 diabetes mellitus with hyperglycemia (HCC) -A1c 13.4-takes metformin at home Continue Semglee 30 units daily, changed moderate sensitivity NovoLog SSI to 3 times daily before meals and at bedtime.  Good inpatient control over the last 24 hours. - He will need new prescriptions when discharged, including a prescription for the continuous blood glucose  monitor.  As per DM coordinator, plan to place CBG monitor on 5/3.    HTN (hypertension) -Controlled on metoprolol and lisinopril.    CAD (coronary artery disease) No anginal symptoms.    Hyperlipidemia associated with type 2 diabetes mellitus (HCC) Continue Zetia.    S/P aortic valve replacement with mechanical valve Follows with Dr. Diona Browner and gets INR checks in [place].  Reports that last year, during a similar situation he was on Lovenox bridging and Coumadin.  Preop, placed on IV heparin and warfarin was held.  5/4 postop, discussed with Dr. Lajoyce Corners who has cleared to resume IV heparin bridge and restart warfarin.  Can consider DC home on Lovenox bridge and warfarin, plan to discuss with his primary cardiologist on Monday.  Mild hyponatremia - appears chronically low    Anxiety - Zoloft    GERD (gastroesophageal reflux disease) -Protonix  Hypokalemia - Replaced  Normocytic anemia - Stable  Severe hypoalbuminemia - Albumin level 1.9.  Dietitian consulted.  Body mass index is 53.11 kg/m./Very morbid obesity Complicates care including wound healing from surgery, DM control etc.  ACP documents: None present.   Code Status: Full Code Consultants: orthopedic surgery DVT prophylaxis:  SCD's Start: 01/29/23 1313 SCDs Start: 01/26/23 1613 SCDs Start: 01/23/23 2131.  Currently on IV heparin drip. Family communication: None at bedside.     Objective:   Vitals:   01/29/23 1200 01/29/23 1215 01/29/23 1230 01/29/23 1245  BP: 123/75 124/73 131/77 (!) 155/72  Pulse: 75 73 70 69  Resp: 18 13 12 13   Temp: 98.1 F (36.7 C)   (!) 97.5 F (36.4 C)  TempSrc:  SpO2: 93% 92% 92% 93%  Weight:      Height:       Filed Weights   01/26/23 0456 01/27/23 0500 01/28/23 0441  Weight: (!) 163.3 kg (!) 163 kg (!) 167.9 kg   Exam: General exam: Young male, moderately built and morbidly obese sitting up comfortably in bed without distress. Respiratory system: Clear to  auscultation. Respiratory effort normal.  Midline sternotomy scar. Cardiovascular system: S1 and S2 heard, RRR.  No JVD or murmurs.  No right lower extremity edema.  Click of mechanical valve heard. Gastrointestinal system: Abdomen is nondistended, soft and nontender. No organomegaly or masses felt. Normal bowel sounds heard. Central nervous system: Alert and oriented. No focal neurological deficits. Extremities: Symmetric 5 x 5 power.  Left BKA stump site dressing/wound VAC intact without acute findings.  Stable without change. Skin: No rashes, lesions or ulcers Psychiatry: Judgement and insight appear normal. Mood & affect appropriate.       CBC: Recent Labs  Lab 01/23/23 1518 01/24/23 0047 01/25/23 0336 01/26/23 0522 01/27/23 1211 01/28/23 0613 01/29/23 0112  WBC 10.6* 9.2 8.7 8.6 7.7 6.5 7.6  NEUTROABS 7.8* 7.2  --   --   --   --   --   HGB 10.8* 10.5* 11.3* 11.0* 10.6* 10.6* 10.1*  HCT 33.3* 31.4* 34.4* 35.2* 33.8* 33.9* 31.8*  MCV 87.2 85.1 87.1 89.1 89.9 89.2 88.6  PLT 261 249 255 285 289 289 278   Basic Metabolic Panel: Recent Labs  Lab 01/23/23 1518 01/24/23 0047 01/25/23 0336 01/26/23 0522  NA 128* 129* 130* 130*  K 3.5 3.3* 4.6 4.3  CL 96* 97* 99 99  CO2 19* 24 24 25   GLUCOSE 450* 362* 336* 306*  BUN 18 13 13 10   CREATININE 0.82 0.84 0.88 0.84  CALCIUM 8.4* 8.2* 8.3* 8.2*  MG  --  1.9  --  1.9  PHOS  --   --   --  3.5   GFR: Estimated Creatinine Clearance: 172.5 mL/min (by C-G formula based on SCr of 0.84 mg/dL).  Scheduled Meds:  vitamin C  1,000 mg Oral Daily   docusate sodium  100 mg Oral BID   [START ON 01/30/2023] docusate sodium  100 mg Oral Daily   ezetimibe  10 mg Oral Daily   gabapentin  600 mg Oral BID   HYDROmorphone       HYDROmorphone       insulin aspart  0-15 Units Subcutaneous Q4H   insulin glargine-yfgn  30 Units Subcutaneous Daily   lisinopril  20 mg Oral Daily   metoprolol tartrate  25 mg Oral BID   multivitamin with minerals   1 tablet Oral Daily   nutrition supplement (JUVEN)  1 packet Oral BID BM   nutrition supplement (JUVEN)  1 packet Oral BID BM   nystatin cream   Topical BID   oxyCODONE       pantoprazole  40 mg Oral Daily   pantoprazole  40 mg Oral Daily   sertraline  50 mg Oral Daily   zinc sulfate  220 mg Oral Daily   Continuous Infusions:  sodium chloride 700 mL/hr at 01/29/23 1133   sodium chloride     acetaminophen      ceFAZolin (ANCEF) IV     magnesium sulfate bolus IVPB     methocarbamol (ROBAXIN) IV     vancomycin 1,500 mg (01/29/23 0601)   Imaging and lab data was personally reviewed No results found.  LOS: 6 days  Marcellus Scott, MD,  FACP, FHM, [facility], [facility], [facility]   [facility] Ad[facility] Angie     To contact the attending provider between 7A-7P or the covering provider during after hours 7P-7A, please log into the web site [email or web address] and access using universal [place] password for that web site. If you do not have the password, please call the hospital operator.

## 2023-01-29 NOTE — Progress Notes (Signed)
ANTICOAGULATION CONSULT NOTE - Follow Up Consult  Pharmacy Consult for heparin Indication:  mechanical AVR  No Known Allergies  Patient Measurements: Height: 5\' 10"  (177.8 cm) Weight: (!) 167.9 kg (370 lb 2.4 oz) IBW/kg (Calculated) : 73 Heparin Dosing Weight: 112.8 kg  Vital Signs: Temp: 97.7 F (36.5 C) (05/04 1705) Temp Source: Oral (05/04 1705) BP: 122/56 (05/04 1705) Pulse Rate: 62 (05/04 1705)  Labs: Recent Labs    01/27/23 1211 01/27/23 2027 01/28/23 0613 01/29/23 0112 01/29/23 2029  HGB 10.6*  --  10.6* 10.1*  --   HCT 33.8*  --  33.9* 31.8*  --   PLT 289  --  289 278  --   HEPARINUNFRC <0.10*   < > 0.47 <0.10* <0.10*   < > = values in this interval not displayed.     Estimated Creatinine Clearance: 172.5 mL/min (by C-G formula based on SCr of 0.84 mg/dL).  Assessment: 47 yo M who presents with cellulitis and abscess of L BKA stump and initiated on broad-spectrum antibiotics. S/p surgical I&D on 5/1 with orthopedic surgery. Patient has a mechanical AVR and taking warfarin 10mg  T/R/Sa/Su and 5mg  on MWF PTA. This regimen was last reviewed in outpt anticoag clinic on 4/9 with therapeutic INR of 2.5. Pt reports last dose of warfarin was ~1 wk prior to admission. INR on admission was 1.3, subtherapeutic. Warfarin was held on admission in anticipation for possible surgery. Pharmacy consulted to dose heparin infusion for mechanical AVR.   Patient is now s/p L amputation below knee revision on 5/4, cleared by Dr. Lajoyce Corners to resume heparin/warfarin bridge post-op.  PM update - initial heparin level post-resumption is undetectable on previous therapeutic rate. CBC stable. No bleeding or issues with infusion per discussion with RN. Warfarin 10mg  dose given earlier this evening.  Goal of Therapy:  Heparin level 0.3-0.7 units/ml Monitor platelets by anticoagulation protocol: Yes   Plan:  No bolus with recent procedure today Increase heparin infusion to 3450 units/hr Check  heparin level in 6hrs Daily heparin level, INR, and CBC Monitor for signs/symptoms of bleeding F/U discharge plans to transition bridge to lovenox   Leia Alf, PharmD, BCPS Please check AMION for all [facility] Pharm[facility] contact numbers Clinical Pharmacist 01/29/2023 9:24 PM

## 2023-01-29 NOTE — Anesthesia Postprocedure Evaluation (Signed)
Anesthesia Post Note  Patient: Adam Long  Procedure(s) Performed: LEFT AMPUTATION BELOW KNEE REVISION AND CLOSURE (Left: Knee)     Patient location during evaluation: PACU Anesthesia Type: General Level of consciousness: awake and alert Pain management: pain level controlled Vital Signs Assessment: post-procedure vital signs reviewed and stable Respiratory status: spontaneous breathing, nonlabored ventilation, respiratory function stable and patient connected to nasal cannula oxygen Cardiovascular status: blood pressure returned to baseline and stable Postop Assessment: no apparent nausea or vomiting Anesthetic complications: no  No notable events documented.  Last Vitals:  Vitals:   01/29/23 1230 01/29/23 1245  BP: 131/77 (!) 155/72  Pulse: 70 69  Resp: 12 13  Temp:  (!) 36.4 C  SpO2: 92% 93%    Last Pain:  Vitals:   01/29/23 1245  TempSrc:   PainSc: 6                  Shelton Silvas

## 2023-01-29 NOTE — Progress Notes (Signed)
Inpatient Rehab Admissions Coordinator:  Consult received. Await PT reassessment and OT evaluation and recommendations to help determine appropriate rehab venues.    Wolfgang Phoenix, MS, CCC-SLP Admissions Coordinator [phone]

## 2023-01-29 NOTE — Progress Notes (Signed)
Physical Therapy Treatment Patient Details Name: Adam Long MRN: [id number] DOB: Jul 01, 1976 Today's Date: 01/29/2023   History of Present Illness 47 y.o. male presents 01/24/23 with a chief complaint of left residual lower extremity pain and draining wound. +cellulitis and abscess; 5/01 I&D of L BKA; PMH includes: anxiety, aortic stenosis, CAD, depression, DM II, HTN, HLD, morbid obesity, AVR, amputations of L toes -> L BKA in 12/22    PT Comments    Pt received in supine and agreeable to session. Pt reporting increased fatigue this session due to being NPO for planned surgery today. Pt able to tolerate standing x4 from EOB with improved power up and balance when pushing up with RUE from EOB. Once standing, pt demonstrating good balance with RW support. Pt reporting discouragement and hopes to be able to use his new prosthesis once healed. Pt continues to benefit from PT services to progress toward functional mobility goals.     Recommendations for follow up therapy are one component of a multi-disciplinary discharge planning process, led by the attending physician.  Recommendations may be updated based on patient status, additional functional criteria and insurance authorization.     Assistance Recommended at Discharge PRN  Patient can return home with the following Assistance with cooking/housework;Assist for transportation;Help with stairs or ramp for entrance   Equipment Recommendations  Other (comment);Rolling walker (2 wheels) (bariatric RW)    Recommendations for Other Services       Precautions / Restrictions Precautions Precautions: Fall Restrictions Weight Bearing Restrictions: No     Mobility  Bed Mobility Overal bed mobility: Modified Independent                  Transfers Overall transfer level: Needs assistance Equipment used: Rolling walker (2 wheels) Transfers: Sit to/from Stand Sit to Stand: Min assist, Min guard           General transfer  comment: From EOB to RW x4. Pt initially with LUE on bed and RUE on RW requiring min A for power up and balance for transition of LUE to RW. Pt demonstrating improved balance and power up with RUE on bed and LUE on RW requiring min guard.    Ambulation/Gait               General Gait Details: pt deferring due to fatigue       Balance Overall balance assessment: Modified Independent                                          Cognition Arousal/Alertness: Awake/alert Behavior During Therapy: WFL for tasks assessed/performed Overall Cognitive Status: Within Functional Limits for tasks assessed                                          Exercises General Exercises - Lower Extremity Long Arc Quad: AROM, Seated, Right, 10 reps    General Comments        Pertinent Vitals/Pain Pain Assessment Pain Assessment: 0-10 Pain Score: 6  Pain Location: lt BKA Pain Descriptors / Indicators: Discomfort Pain Intervention(s): Monitored during session     PT Goals (current goals can now be found in the care plan section) Acute Rehab PT Goals Patient Stated Goal: to get a new walker as his is in disrepair PT  Goal Formulation: With patient Time For Goal Achievement: 02/10/23 Potential to Achieve Goals: Good Progress towards PT goals: Progressing toward goals    Frequency    Min 4X/week      PT Plan Current plan remains appropriate       AM-PAC PT "6 Clicks" Mobility   Outcome Measure  Help needed turning from your back to your side while in a flat bed without using bedrails?: None Help needed moving from lying on your back to sitting on the side of a flat bed without using bedrails?: None Help needed moving to and from a bed to a chair (including a wheelchair)?: A Little Help needed standing up from a chair using your arms (e.g., wheelchair or bedside chair)?: A Little Help needed to walk in hospital room?: Total Help needed climbing 3-5  steps with a railing? : Total 6 Click Score: 16    End of Session Equipment Utilized During Treatment: Gait belt Activity Tolerance: Patient limited by fatigue Patient left: in bed;with bed alarm set;with call bell/phone within reach Nurse Communication: Mobility status PT Visit Diagnosis: Difficulty in walking, not elsewhere classified (R26.2)     Time: 1610-9604 PT Time Calculation (min) (ACUTE ONLY): 16 min  Charges:  $Therapeutic Activity: 8-22 mins                     Johny Shock, PTA Acute Rehabilitation Services Secure Chat Preferred  [phone]    Johny Shock 01/29/2023, 9:37 AM

## 2023-01-30 DIAGNOSIS — L03116 Cellulitis of left lower limb: Secondary | ICD-10-CM | POA: Diagnosis not present

## 2023-01-30 DIAGNOSIS — L02416 Cutaneous abscess of left lower limb: Secondary | ICD-10-CM | POA: Diagnosis not present

## 2023-01-30 LAB — GLUCOSE, CAPILLARY
Glucose-Capillary: 155 mg/dL — ABNORMAL HIGH (ref 70–99)
Glucose-Capillary: 145 mg/dL — ABNORMAL HIGH (ref 70–99)
Glucose-Capillary: 115 mg/dL — ABNORMAL HIGH (ref 70–99)
Glucose-Capillary: 126 mg/dL — ABNORMAL HIGH (ref 70–99)

## 2023-01-30 LAB — CBC
HCT: 33.1 % — ABNORMAL LOW (ref 39.0–52.0)
Hemoglobin: 10.8 g/dL — ABNORMAL LOW (ref 13.0–17.0)
MCH: 28.9 pg (ref 26.0–34.0)
MCHC: 32.6 g/dL (ref 30.0–36.0)
MCV: 88.5 fL (ref 80.0–100.0)
Platelets: 283 10*3/uL (ref 150–400)
RBC: 3.74 MIL/uL — ABNORMAL LOW (ref 4.22–5.81)
RDW: 13.9 % (ref 11.5–15.5)
WBC: 7 10*3/uL (ref 4.0–10.5)
nRBC: 0 % (ref 0.0–0.2)

## 2023-01-30 LAB — BASIC METABOLIC PANEL
Anion gap: 8 (ref 5–15)
BUN: 10 mg/dL (ref 6–20)
CO2: 22 mmol/L (ref 22–32)
Calcium: 7.9 mg/dL — ABNORMAL LOW (ref 8.9–10.3)
Chloride: 101 mmol/L (ref 98–111)
Creatinine, Ser: 0.85 mg/dL (ref 0.61–1.24)
GFR, Estimated: >60 mL/min (ref >60)
Glucose, Bld: 160 mg/dL — ABNORMAL HIGH (ref 70–99)
Potassium: 3.5 mmol/L (ref 3.5–5.1)
Sodium: 131 mmol/L — ABNORMAL LOW (ref 135–145)

## 2023-01-30 LAB — HEPARIN LEVEL (UNFRACTIONATED)
Heparin Unfractionated: 0.5 IU/mL (ref 0.30–0.70)
Heparin Unfractionated: 0.58 IU/mL (ref 0.30–0.70)

## 2023-01-30 LAB — PROTIME-INR
INR: 1.2 (ref 0.8–1.2)
Prothrombin Time: 14.8 seconds (ref 11.4–15.2)

## 2023-01-30 MED ORDER — WARFARIN SODIUM 10 MG PO TABS
10.0000 mg | ORAL_TABLET | Freq: Once | ORAL | Status: AC
  Administered 2023-01-30: 10 mg via ORAL
  Filled 2023-01-30: qty 1

## 2023-01-30 NOTE — Progress Notes (Signed)
Inpatient Rehab Admissions Coordinator:  PT reassessment completed. PT continues to recommend no PT follow up. Pt does not appear to warrant CIR. AC will sign off. TOC made aware.   Wolfgang Phoenix, MS, CCC-SLP Admissions Coordinator [phone]

## 2023-01-30 NOTE — Progress Notes (Signed)
Physical Therapy Treatment Patient Details Name: Adam Long MRN: [id number] DOB: 01/14/1976 Today's Date: 01/30/2023   History of Present Illness 47 y.o. male presents 01/24/23 with a chief complaint of left residual lower extremity pain and draining wound. +cellulitis and abscess. 5/01 I&D of L BKA. Pt returned to OR 5/4 for L BKA revision and wound vac placement. PMH includes: anxiety, aortic stenosis, CAD, depression, DM II, HTN, HLD, morbid obesity, AVR, amputations of L toes -> L BKA in 12/22    PT Comments    Pt seen for PT following L BKA revision. He demo mod I bed mobility. Min assist required for sit to stand with RW. Transfer to recliner deferred due to L residual limb pain. Pt supine in bed at end of session. Current POC remains appropriate.   Recommendations for follow up therapy are one component of a multi-disciplinary discharge planning process, led by the attending physician.  Recommendations may be updated based on patient status, additional functional criteria and insurance authorization.  Follow Up Recommendations       Assistance Recommended at Discharge PRN  Patient can return home with the following Assistance with cooking/housework;Assist for transportation;Help with stairs or ramp for entrance   Equipment Recommendations  Other (comment);Rolling walker (2 wheels) (bariatric RW)    Recommendations for Other Services       Precautions / Restrictions Precautions Precautions: Fall;Other (comment) Precaution Comments: wound vac L residual limb     Mobility  Bed Mobility Overal bed mobility: Modified Independent             General bed mobility comments: +rail, increased time, HOB elevated    Transfers Overall transfer level: Needs assistance Equipment used: Rolling walker (2 wheels) Transfers: Sit to/from Stand Sit to Stand: Min assist           General transfer comment: STS from EOB. Assist to stabilize RW for power up. Increased time.     Ambulation/Gait                   Stairs             Wheelchair Mobility    Modified Rankin (Stroke Patients Only)       Balance Overall balance assessment: Needs assistance Sitting-balance support: No upper extremity supported, Feet supported Sitting balance-Leahy Scale: Good     Standing balance support: Bilateral upper extremity supported, Reliant on assistive device for balance, During functional activity Standing balance-Leahy Scale: Poor                              Cognition Arousal/Alertness: Awake/alert Behavior During Therapy: WFL for tasks assessed/performed Overall Cognitive Status: Within Functional Limits for tasks assessed                                          Exercises      General Comments        Pertinent Vitals/Pain Pain Assessment Pain Assessment: 0-10 Pain Score: 7  Pain Location: L residual limb Pain Descriptors / Indicators: Sore, Grimacing, Guarding Pain Intervention(s): Monitored during session, Limited activity within patient's tolerance, Patient requesting pain meds-RN notified    Home Living                          Prior Function  PT Goals (current goals can now be found in the care plan section) Acute Rehab PT Goals Patient Stated Goal: to get a new walker as his is in disrepair Progress towards PT goals: Progressing toward goals    Frequency    Min 4X/week      PT Plan Current plan remains appropriate    Co-evaluation              AM-PAC PT "6 Clicks" Mobility   Outcome Measure  Help needed turning from your back to your side while in a flat bed without using bedrails?: None Help needed moving from lying on your back to sitting on the side of a flat bed without using bedrails?: None Help needed moving to and from a bed to a chair (including a wheelchair)?: A Little Help needed standing up from a chair using your arms (e.g., wheelchair or  bedside chair)?: A Little Help needed to walk in hospital room?: Total Help needed climbing 3-5 steps with a railing? : Total 6 Click Score: 16    End of Session Equipment Utilized During Treatment: Gait belt Activity Tolerance: Patient tolerated treatment well Patient left: in bed;with call bell/phone within reach Nurse Communication: Mobility status PT Visit Diagnosis: Difficulty in walking, not elsewhere classified (R26.2)     Time: 1610-9604 PT Time Calculation (min) (ACUTE ONLY): 24 min  Charges:  $Therapeutic Activity: 23-37 mins                     Ferd Glassing., PT  Office # [phone]    Ilda Foil 01/30/2023, 2:11 PM

## 2023-01-30 NOTE — Progress Notes (Signed)
ANTICOAGULATION CONSULT NOTE - Follow Up Consult  Pharmacy Consult for heparin Indication:  mechanical AVR  No Known Allergies  Patient Measurements: Height: 5\' 10"  (177.8 cm) Weight: (!) 167.9 kg (370 lb 2.4 oz) IBW/kg (Calculated) : 73 Heparin Dosing Weight: 112.8 kg  Vital Signs: Temp: 97.7 F (36.5 C) (05/05 0300) Temp Source: Oral (05/04 2134) BP: 129/74 (05/05 0300) Pulse Rate: 67 (05/05 0300)  Labs: Recent Labs    01/28/23 0613 01/29/23 0112 01/29/23 2029 01/30/23 0351  HGB 10.6* 10.1*  --  10.8*  HCT 33.9* 31.8*  --  33.1*  PLT 289 278  --  283  HEPARINUNFRC 0.47 <0.10* <0.10* 0.50  CREATININE  --   --   --  0.85     Estimated Creatinine Clearance: 170.5 mL/min (by C-G formula based on SCr of 0.85 mg/dL).  Assessment: 47 yo M who presents with cellulitis and abscess of L BKA stump and initiated on broad-spectrum antibiotics. S/p surgical I&D on 5/1 with orthopedic surgery. Patient has a mechanical AVR and taking warfarin 10mg  T/R/Sa/Su and 5mg  on MWF PTA. This regimen was last reviewed in outpt anticoag clinic on 4/9 with therapeutic INR of 2.5. Pt reports last dose of warfarin was ~1 wk prior to admission. INR on admission was 1.3, subtherapeutic. Warfarin was held on admission in anticipation for possible surgery. Pharmacy consulted to dose heparin infusion for mechanical AVR.   Patient is now s/p L amputation below knee revision on 5/4, cleared by Dr. Lajoyce Corners to resume heparin/warfarin bridge post-op.  PM update - initial heparin level post-resumption is undetectable on previous therapeutic rate. CBC stable. No bleeding or issues with infusion per discussion with RN. Warfarin 10mg  dose given earlier this evening.  5/5 AM update:  Heparin level therapeutic after rate increase  Goal of Therapy:  Heparin level 0.3-0.7 units/ml Monitor platelets by anticoagulation protocol: Yes   Plan:  Cont heparin 3450 units/hr 1200 heparin level  Abran Duke, PharmD,  BCPS Clinical Pharmacist Phone: [phone]

## 2023-01-30 NOTE — Plan of Care (Signed)
Pt vitals stable. Med compliant. Heparin drip increased to 34.5 to get therapeutic. Pt received dilaudid and oxy during the overnight to control pain.  Problem: Education: Goal: Ability to describe self-care measures that may prevent or decrease complications (Diabetes Survival Skills Education) will improve Outcome: Progressing Goal: Individualized Educational Video(s) Outcome: Progressing   Problem: Coping: Goal: Ability to adjust to condition or change in health will improve Outcome: Progressing   Problem: Fluid Volume: Goal: Ability to maintain a balanced intake and output will improve Outcome: Progressing   Problem: Health Behavior/Discharge Planning: Goal: Ability to identify and utilize available resources and services will improve Outcome: Progressing Goal: Ability to manage health-related needs will improve Outcome: Progressing   Problem: Metabolic: Goal: Ability to maintain appropriate glucose levels will improve Outcome: Progressing   Problem: Nutritional: Goal: Maintenance of adequate nutrition will improve Outcome: Progressing Goal: Progress toward achieving an optimal weight will improve Outcome: Progressing   Problem: Skin Integrity: Goal: Risk for impaired skin integrity will decrease Outcome: Progressing   Problem: Tissue Perfusion: Goal: Adequacy of tissue perfusion will improve Outcome: Progressing   Problem: Education: Goal: Knowledge of General Education information will improve Description: Including pain rating scale, medication(s)/side effects and non-pharmacologic comfort measures Outcome: Progressing   Problem: Health Behavior/Discharge Planning: Goal: Ability to manage health-related needs will improve Outcome: Progressing   Problem: Clinical Measurements: Goal: Ability to maintain clinical measurements within normal limits will improve Outcome: Progressing Goal: Will remain free from infection Outcome: Progressing Goal: Diagnostic test  results will improve Outcome: Progressing Goal: Respiratory complications will improve Outcome: Progressing Goal: Cardiovascular complication will be avoided Outcome: Progressing   Problem: Activity: Goal: Risk for activity intolerance will decrease Outcome: Progressing   Problem: Nutrition: Goal: Adequate nutrition will be maintained Outcome: Progressing   Problem: Coping: Goal: Level of anxiety will decrease Outcome: Progressing   Problem: Elimination: Goal: Will not experience complications related to bowel motility Outcome: Progressing Goal: Will not experience complications related to urinary retention Outcome: Progressing   Problem: Pain Managment: Goal: General experience of comfort will improve Outcome: Progressing   Problem: Safety: Goal: Ability to remain free from injury will improve Outcome: Progressing   Problem: Skin Integrity: Goal: Risk for impaired skin integrity will decrease Outcome: Progressing   Problem: Education: Goal: Knowledge of the prescribed therapeutic regimen will improve Outcome: Progressing Goal: Ability to verbalize activity precautions or restrictions will improve Outcome: Progressing Goal: Understanding of discharge needs will improve Outcome: Progressing   Problem: Activity: Goal: Ability to perform//tolerate increased activity and mobilize with assistive devices will improve Outcome: Progressing   Problem: Clinical Measurements: Goal: Postoperative complications will be avoided or minimized Outcome: Progressing   Problem: Self-Care: Goal: Ability to meet self-care needs will improve Outcome: Progressing   Problem: Self-Concept: Goal: Ability to maintain and perform role responsibilities to the fullest extent possible will improve Outcome: Progressing   Problem: Pain Management: Goal: Pain level will decrease with appropriate interventions Outcome: Progressing   Problem: Skin Integrity: Goal: Demonstration of wound  healing without infection will improve Outcome: Progressing

## 2023-01-30 NOTE — Progress Notes (Signed)
[facility] Progress Note  Patient: Adam Long     UJW:[id number]  DOA: 01/23/2023   PCP: [name], DO       Brief hospital course: 47 year old male with type 2 diabetes mellitus, uncontrolled, CAD, hypertension, AVR, dyslipidemia, left BKA with revision x 2, morbid obesity who presents to the hospital for left lower extremity wound caused by his prosthesis along with bleeding.  He stated he stopped wearing his prosthesis about 5 days before.  Has a history of MSSA bacteremia in the setting of a left diabetic foot ulcer in 2023 which was treated with a BKA and IV antibiotics.  Also has a history of E. coli, Proteus mirabilis and E faecalis and a separate infection with-a, E faecalis, Clostridium perfringens and multidrug-resistant Klebsiella pneumonia growing from the wound. CT scan showed findings suggestive of abscesses without osteomyelitis.  S/p irrigation and excisional debridement with wound VAC application 5/1.  S/p left BKA revision by Dr. Lajoyce Corners 5/4.  Postop resumed IV heparin bridging and restarted warfarin per pharmacy.   Subjective:    Assessment and Plan: Principal Problem: Left BKA MRSA stump abscess -Multiple infections in the past requiring revisions - S/p irrigation and excisional debridement with wound VAC application 5/1 - S/p left BKA revision by Dr. Lajoyce Corners 5/4.  -Culture confirms MRSA.  Discontinued meropenem.  Continue vancomycin.  Will need to check with orthopedics/ID regarding postop choice and duration of antibiotics. - nutrition consulted to assist with nutrition to allow for wound healing -Per Dr. Lajoyce Corners follow-up, reinforced wound VAC dressing and could potentially DC tomorrow with wound VAC with outpatient follow-up in 1 week.  Active Problems:   Uncontrolled type 2 diabetes mellitus with hyperglycemia (HCC) -A1c 13.4-takes metformin at home Continue Semglee 30 units daily, changed moderate sensitivity NovoLog SSI to 3 times daily before meals  and at bedtime.  Good inpatient control over the last 24 hours. - He will need new prescriptions when discharged, including a prescription for the continuous blood glucose monitor.  As per DM coordinator, plan to place CBG monitor on 5/3.    HTN (hypertension) -Controlled on metoprolol and lisinopril.    CAD (coronary artery disease) No anginal symptoms.    Hyperlipidemia associated with type 2 diabetes mellitus (HCC) Continue Zetia.    S/P aortic valve replacement with mechanical valve Follows with Dr. Diona Browner and gets INR checks in [place].  Reports that last year, during a similar situation he was on Lovenox bridging and Coumadin.  Preop, placed on IV heparin and warfarin was held.  INR 1.2.  Heparin bridging and warfarin per pharmacy resumed postop.  Can consider DC home on Lovenox bridge and warfarin, plan to discuss with his primary cardiologist on Monday.  Mild hyponatremia - appears chronically low    Anxiety - Zoloft    GERD (gastroesophageal reflux disease) -Protonix  Hypokalemia - Replaced  Normocytic anemia - Stable  Severe hypoalbuminemia - Albumin level 1.9.  Dietitian consulted.  Body mass index is 54.16 kg/m./Very morbid obesity Complicates care including wound healing from surgery, DM control etc.  ACP documents: None present.   Code Status: Full Code Consultants: orthopedic surgery DVT prophylaxis:  SCD's Start: 01/29/23 1313 SCDs Start: 01/26/23 1613 SCDs Start: 01/23/23 2131.  Currently on IV heparin drip. Family communication: None at bedside. warfarin (COUMADIN) tablet 10 mg     Objective:   Vitals:   01/30/23 0300 01/30/23 0500 01/30/23 0555 01/30/23 0852  BP: 129/74  139/79 120/72  Pulse: 67  66 72  Resp: 18  18 18   Temp: 97.7 F (36.5 C)  97.8 F (36.6 C) 98.2 F (36.8 C)  TempSrc:   Oral Oral  SpO2: 99%  98% 98%  Weight:  (!) 171.2 kg    Height:       Filed Weights   01/27/23 0500 01/28/23 0441 01/30/23 0500  Weight: (!) 163 kg  (!) 167.9 kg (!) 171.2 kg   Exam: General exam: Young male, moderately built and morbidly obese sitting up comfortably in bed without distress. Respiratory system: Clear to auscultation. Respiratory effort normal.  Midline sternotomy scar. Cardiovascular system: S1 and S2 heard, RRR.  No JVD or murmurs.  No right lower extremity edema.  Click of mechanical valve heard. Gastrointestinal system: Abdomen is nondistended, soft and nontender. No organomegaly or masses felt. Normal bowel sounds heard. Central nervous system: Alert and oriented. No focal neurological deficits. Extremities: Symmetric 5 x 5 power.  Left big Jai stump with wound VAC without acute findings or leaks. Skin: No rashes, lesions or ulcers Psychiatry: Judgement and insight appear normal. Mood & affect appropriate.       CBC: Recent Labs  Lab 01/23/23 1518 01/24/23 0047 01/25/23 0336 01/26/23 0522 01/27/23 1211 01/28/23 0613 01/29/23 0112 01/30/23 0351  WBC 10.6* 9.2   < > 8.6 7.7 6.5 7.6 7.0  NEUTROABS 7.8* 7.2  --   --   --   --   --   --   HGB 10.8* 10.5*   < > 11.0* 10.6* 10.6* 10.1* 10.8*  HCT 33.3* 31.4*   < > 35.2* 33.8* 33.9* 31.8* 33.1*  MCV 87.2 85.1   < > 89.1 89.9 89.2 88.6 88.5  PLT 261 249   < > 285 289 289 278 283   < > = values in this interval not displayed.   Basic Metabolic Panel: Recent Labs  Lab 01/23/23 1518 01/24/23 0047 01/25/23 0336 01/26/23 0522 01/30/23 0351  NA 128* 129* 130* 130* 131*  K 3.5 3.3* 4.6 4.3 3.5  CL 96* 97* 99 99 101  CO2 19* 24 24 25 22   GLUCOSE 450* 362* 336* 306* 160*  BUN 18 13 13 10 10   CREATININE 0.82 0.84 0.88 0.84 0.85  CALCIUM 8.4* 8.2* 8.3* 8.2* 7.9*  MG  --  1.9  --  1.9  --   PHOS  --   --   --  3.5  --    GFR: Estimated Creatinine Clearance: 172.5 mL/min (by C-G formula based on SCr of 0.85 mg/dL).  Scheduled Meds:  vitamin C  1,000 mg Oral Daily   docusate sodium  100 mg Oral BID   ezetimibe  10 mg Oral Daily   gabapentin  600 mg Oral  BID   insulin aspart  0-15 Units Subcutaneous TID WC   insulin aspart  0-5 Units Subcutaneous QHS   insulin glargine-yfgn  30 Units Subcutaneous Daily   lisinopril  20 mg Oral Daily   metoprolol tartrate  25 mg Oral BID   multivitamin with minerals  1 tablet Oral Daily   nutrition supplement (JUVEN)  1 packet Oral BID BM   nystatin cream   Topical BID   pantoprazole  40 mg Oral Daily   sertraline  50 mg Oral Daily   warfarin  10 mg Oral ONCE-1600   Warfarin - Pharmacist Dosing Inpatient   Does not apply q1600   zinc sulfate  220 mg Oral Daily   Continuous Infusions:  sodium chloride 75 mL/hr at 01/29/23  2128   heparin 3,450 Units/hr (01/30/23 1324)   magnesium sulfate bolus IVPB     methocarbamol (ROBAXIN) IV     vancomycin 1,500 mg (01/30/23 0543)   Imaging and lab data was personally reviewed No results found.  LOS: 7 days   Marcellus Scott, MD,  FACP, [facility], [facility], [facility], [facility]   [facility] [place]     To contact the attending provider between 7A-7P or the covering provider during after hours 7P-7A, please log into the web site [email or web address] and access using universal [place] password for that web site. If you do not have the password, please call the hospital operator.

## 2023-01-30 NOTE — Progress Notes (Signed)
Patient ID: Adam Long, male   DOB: Sep 15, 1976, 47 y.o.   MRN: [id number] Patient is seen in follow-up for revision transtibial amputation on the left.  Patient states the wound VAC was turned off it is now working well.  There is 150 cc in the wound VAC canister.  I reinforced the wound VAC dressing and patient has a good suction fit.  Anticipate patient can discharge tomorrow with the Praveena plus portable wound VAC pump.  He will follow-up in the office in 1 week.  Discussed that his discharge planning is based on his anticoagulation therapy for his valve.

## 2023-01-30 NOTE — Progress Notes (Signed)
ANTICOAGULATION CONSULT NOTE - Follow Up Consult  Pharmacy Consult for heparin bridge to warfarin Indication:  mechanical AVR  No Known Allergies  Patient Measurements: Height: 5\' 10"  (177.8 cm) Weight: (!) 171.2 kg (377 lb 6.8 oz) IBW/kg (Calculated) : 73 Heparin Dosing Weight: 112.8 kg  Vital Signs: Temp: 97.8 F (36.6 C) (05/05 0555) Temp Source: Oral (05/05 0555) BP: 139/79 (05/05 0555) Pulse Rate: 66 (05/05 0555)  Labs: Recent Labs    01/28/23 0613 01/29/23 0112 01/29/23 2029 01/30/23 0351  HGB 10.6* 10.1*  --  10.8*  HCT 33.9* 31.8*  --  33.1*  PLT 289 278  --  283  LABPROT  --   --   --  14.8  INR  --   --   --  1.2  HEPARINUNFRC 0.47 <0.10* <0.10* 0.50  CREATININE  --   --   --  0.85     Estimated Creatinine Clearance: 172.5 mL/min (by C-G formula based on SCr of 0.85 mg/dL).  Assessment: 47 yo M who presents with cellulitis and abscess of L BKA stump and initiated on broad-spectrum antibiotics. S/p surgical I&D on 5/1 with orthopedic surgery. Patient has a mechanical AVR and taking warfarin 10mg  T/R/Sa/Su and 5mg  on MWF PTA. This regimen was last reviewed in outpt anticoag clinic on 4/9 with therapeutic INR of 2.5. Pt reports last dose of warfarin was ~1 wk prior to admission. INR on admission was 1.3, subtherapeutic. Warfarin was held on admission in anticipation for possible surgery. Pharmacy consulted to dose heparin infusion for mechanical AVR. Patient is now s/p L amputation below knee revision on 5/4, cleared by Dr. Lajoyce Corners to resume heparin/warfarin bridge post-op.  Heparin level had second therapeutic level at 0.58. INR is subtherapeutic at 1.2. Will continue his home regimen and give 10 mg today. CBC is stable. No known s/sx of bleeding.  Goal of Therapy:  Heparin level 0.3-0.7 units/ml Monitor platelets by anticoagulation protocol: Yes   Plan:  Cont heparin 3450 units/hr Warfarin 10 mg today Daily heparin level, INR, and CBC Monitor for  signs/symptoms of bleeding F/U discharge plans to transition bridge to lovenox  March Rummage, PharmD PGY2 Pharmacy Resident  Please check AMION for all [facility] pharmacy phone numbers After 10:00 PM call main pharmacy [phone]

## 2023-01-31 ENCOUNTER — Encounter (HOSPITAL_COMMUNITY): Payer: Self-pay | Admitting: Orthopedic Surgery

## 2023-01-31 ENCOUNTER — Other Ambulatory Visit: Payer: Self-pay

## 2023-01-31 DIAGNOSIS — L03116 Cellulitis of left lower limb: Secondary | ICD-10-CM | POA: Diagnosis not present

## 2023-01-31 DIAGNOSIS — B9562 Methicillin resistant Staphylococcus aureus infection as the cause of diseases classified elsewhere: Secondary | ICD-10-CM

## 2023-01-31 DIAGNOSIS — L02416 Cutaneous abscess of left lower limb: Secondary | ICD-10-CM | POA: Diagnosis not present

## 2023-01-31 DIAGNOSIS — T8742 Infection of amputation stump, left upper extremity: Secondary | ICD-10-CM

## 2023-01-31 DIAGNOSIS — E119 Type 2 diabetes mellitus without complications: Secondary | ICD-10-CM

## 2023-01-31 DIAGNOSIS — Z794 Long term (current) use of insulin: Secondary | ICD-10-CM

## 2023-01-31 LAB — CBC
HCT: 29.5 % — ABNORMAL LOW (ref 39.0–52.0)
HCT: 26.7 % — ABNORMAL LOW (ref 39.0–52.0)
Hemoglobin: 9.7 g/dL — ABNORMAL LOW (ref 13.0–17.0)
Hemoglobin: 8.3 g/dL — ABNORMAL LOW (ref 13.0–17.0)
MCH: 28.6 pg (ref 26.0–34.0)
MCH: 27.9 pg (ref 26.0–34.0)
MCHC: 32.9 g/dL (ref 30.0–36.0)
MCHC: 31.1 g/dL (ref 30.0–36.0)
MCV: 87 fL (ref 80.0–100.0)
MCV: 89.9 fL (ref 80.0–100.0)
Platelets: 289 10*3/uL (ref 150–400)
Platelets: 327 10*3/uL (ref 150–400)
RBC: 3.39 MIL/uL — ABNORMAL LOW (ref 4.22–5.81)
RBC: 2.97 MIL/uL — ABNORMAL LOW (ref 4.22–5.81)
RDW: 13.9 % (ref 11.5–15.5)
RDW: 13.7 % (ref 11.5–15.5)
WBC: 6.5 10*3/uL (ref 4.0–10.5)
WBC: 6.8 10*3/uL (ref 4.0–10.5)
nRBC: 0 % (ref 0.0–0.2)
nRBC: 0.3 % — ABNORMAL HIGH (ref 0.0–0.2)

## 2023-01-31 LAB — GLUCOSE, CAPILLARY
Glucose-Capillary: 181 mg/dL — ABNORMAL HIGH (ref 70–99)
Glucose-Capillary: 147 mg/dL — ABNORMAL HIGH (ref 70–99)
Glucose-Capillary: 170 mg/dL — ABNORMAL HIGH (ref 70–99)
Glucose-Capillary: 124 mg/dL — ABNORMAL HIGH (ref 70–99)

## 2023-01-31 LAB — PROTIME-INR
INR: 1.2 (ref 0.8–1.2)
Prothrombin Time: 14.7 seconds (ref 11.4–15.2)

## 2023-01-31 LAB — HEMOGLOBIN AND HEMATOCRIT, BLOOD
HCT: 26.5 % — ABNORMAL LOW (ref 39.0–52.0)
HCT: 24.2 % — ABNORMAL LOW (ref 39.0–52.0)
Hemoglobin: 8.2 g/dL — ABNORMAL LOW (ref 13.0–17.0)
Hemoglobin: 7.9 g/dL — ABNORMAL LOW (ref 13.0–17.0)

## 2023-01-31 LAB — AEROBIC/ANAEROBIC CULTURE W GRAM STAIN (SURGICAL/DEEP WOUND)

## 2023-01-31 LAB — HEPARIN LEVEL (UNFRACTIONATED)
Heparin Unfractionated: 0.54 IU/mL (ref 0.30–0.70)
Heparin Unfractionated: 0.26 IU/mL — ABNORMAL LOW (ref 0.30–0.70)

## 2023-01-31 MED ORDER — WARFARIN SODIUM 5 MG PO TABS
5.0000 mg | ORAL_TABLET | Freq: Once | ORAL | Status: AC
  Administered 2023-01-31: 5 mg via ORAL
  Filled 2023-01-31: qty 1

## 2023-01-31 NOTE — Progress Notes (Addendum)
[facility] Progress Note  Patient: Adam Long     ZOX:[id number]  DOA: 01/23/2023   PCP: [name], DO       Brief hospital course: 47 year old male with type 2 diabetes mellitus, uncontrolled, CAD, hypertension, AVR, dyslipidemia, left BKA with revision x 2, morbid obesity who presents to the hospital for left lower extremity wound caused by his prosthesis along with bleeding.  He stated he stopped wearing his prosthesis about 5 days before.  Has a history of MSSA bacteremia in the setting of a left diabetic foot ulcer in 2023 which was treated with a BKA and IV antibiotics.  Also has a history of E. coli, Proteus mirabilis and E faecalis and a separate infection with-a, E faecalis, Clostridium perfringens and multidrug-resistant Klebsiella pneumonia growing from the wound. CT scan showed findings suggestive of abscesses without osteomyelitis.  S/p irrigation and excisional debridement with wound VAC application 5/1.  S/p left BKA revision by Dr. Lajoyce Corners 5/4.  Postop resumed IV heparin bridging and restarted warfarin per pharmacy.  5/6: Postop bleeding from left BKA stump revision.  Briefly held IV heparin with plans to resume when bleeding stops.  ID consulted for antibiotic recommendations, Dr. Lajoyce Corners recommends 4 weeks of IV antibiotics.   Subjective:  States that he had a washup.  Sitting up in chair without complaints.  No pain reported.  Subsequently this afternoon, RN reported bleeding from surgical site into wound VAC, as noted below.  Assessment and Plan: Principal Problem: Left BKA MRSA stump abscess -Multiple infections in the past requiring revisions - S/p irrigation and excisional debridement with wound VAC application 5/1 - S/p left BKA revision by Dr. Lajoyce Corners 5/4.  -Culture confirms MRSA.  Discontinued meropenem.  Continue vancomycin.  Will need to check with orthopedics/ID regarding postop choice and duration of antibiotics. - nutrition consulted to assist with  nutrition to allow for wound healing -Per Dr. Lajoyce Corners follow-up, reinforced wound VAC dressing and could potentially DC tomorrow with wound VAC with outpatient follow-up in 1 week.  5/6: Per nursing, Patient putting out bright red blood with canister volume up to 350 mL.  Discussed with Dr. Lajoyce Corners, recommended holding IV heparin for 2 hours and if bleeding slows or stops then resume IV heparin and monitor for rebleeding.  He sees no role for taking back to the OR.  Checking stat CBC and type and screen.  Difficult situation.  Will need to resume IV heparin soon as possible given presence of mechanical valve. See below  Active Problems:   Postop Acute Blood Loss Anemia ~ 3p. I reassessed him at bedside with Valeta Harms, Ortho PA. Bled ~550 ml in wound vac canister. Hb dropped by 1.4 gm since this am, down to 8.3 from 9.7. [name] reinforced L BKA ace wrap on top of wound vac. Will check rpt H&H in 4 hours. Heparin on hold already for near 2 hours and since ongoing bleeding will hold and additional 2 hours. Patient agreeable to PRBC if Hb 7gm or less. Close monitoring. Updated Dr. Lajoyce Corners via [place].  Uncontrolled type 2 diabetes mellitus with hyperglycemia (HCC) -A1c 13.4-takes metformin at home Continue Semglee 30 units daily, changed moderate sensitivity NovoLog SSI to 3 times daily before meals and at bedtime.  Good inpatient control over the last 48 hours. - He will need new prescriptions when discharged, including a prescription for the continuous blood glucose monitor.  As per DM coordinator, plan to place CBG monitor on 5/3.    HTN (hypertension) -  Controlled on metoprolol and lisinopril.    CAD (coronary artery disease) No anginal symptoms.    Hyperlipidemia associated with type 2 diabetes mellitus (HCC) Continue Zetia.    S/P aortic valve replacement with mechanical valve Follows with Dr. Diona Browner and gets INR checks in [place].  Reports that last year, during a similar situation he was on Lovenox  bridging and Coumadin.  Preop, placed on IV heparin and warfarin was held.  INR 1.2.  Heparin bridging and warfarin per pharmacy resumed postop.  Communicated with Dr. Diona Browner on 5/6: He advised that patient could likely go on Lovenox bridge for mechanical AVR.  Mild hyponatremia - appears chronically low    Anxiety - Zoloft    GERD (gastroesophageal reflux disease) -Protonix  Hypokalemia - Replaced  Severe hypoalbuminemia - Albumin level 1.9.  Dietitian consulted.  Body mass index is 54.16 kg/m./Very morbid obesity Complicates care including wound healing from surgery, DM control etc.  ACP documents: None present.   Code Status: Full Code Consultants: orthopedic surgery DVT prophylaxis:  SCD's Start: 01/29/23 1313 SCDs Start: 01/26/23 1613 SCDs Start: 01/23/23 2131.  Currently on IV heparin drip, temporarily held. Family communication: Patient states that he has been updating family.  If he updates 1% the rest of them are updated.  He declines MDs offer to speak with family.  He reports that he lives with his uncle. warfarin (COUMADIN) tablet 5 mg     Objective:   Vitals:   01/30/23 1644 01/30/23 2048 01/31/23 0421 01/31/23 0858  BP: 139/78 136/69 132/71 113/67  Pulse: 69 73 63 79  Resp: 18 18 17 19   Temp: 97.8 F (36.6 C) 98.1 F (36.7 C) 98 F (36.7 C) 98.4 F (36.9 C)  TempSrc: Oral Oral Oral Oral  SpO2: 96% 96% 95% 98%  Weight:   (!) 171.2 kg   Height:       Filed Weights   01/28/23 0441 01/30/23 0500 01/31/23 0421  Weight: (!) 167.9 kg (!) 171.2 kg (!) 171.2 kg   Exam: General exam: Young male, moderately built and morbidly obese sitting up comfortably in reclining chair without distress Respiratory system: Clear to auscultation. Respiratory effort normal.  Midline sternotomy scar. Cardiovascular system: S1 and S2 heard, RRR.  No JVD or murmurs.  No right lower extremity edema.  Click of mechanical valve heard. Gastrointestinal system: Abdomen is  nondistended, soft and nontender. No organomegaly or masses felt. Normal bowel sounds heard. Central nervous system: Alert and oriented. No focal neurological deficits. Extremities: Symmetric 5 x 5 power.  Left BKA stump with wound VAC without acute findings or leaks when seen this morning. Skin: No rashes, lesions or ulcers Psychiatry: Judgement and insight appear normal. Mood & affect appropriate.       CBC: Recent Labs  Lab 01/27/23 1211 01/28/23 0613 01/29/23 0112 01/30/23 0351 01/31/23 0437  WBC 7.7 6.5 7.6 7.0 6.5  HGB 10.6* 10.6* 10.1* 10.8* 9.7*  HCT 33.8* 33.9* 31.8* 33.1* 29.5*  MCV 89.9 89.2 88.6 88.5 87.0  PLT 289 289 278 283 289   Basic Metabolic Panel: Recent Labs  Lab 01/25/23 0336 01/26/23 0522 01/30/23 0351  NA 130* 130* 131*  K 4.6 4.3 3.5  CL 99 99 101  CO2 24 25 22   GLUCOSE 336* 306* 160*  BUN 13 10 10   CREATININE 0.88 0.84 0.85  CALCIUM 8.3* 8.2* 7.9*  MG  --  1.9  --   PHOS  --  3.5  --    GFR: Estimated  Creatinine Clearance: 172.5 mL/min (by C-G formula based on SCr of 0.85 mg/dL).  Scheduled Meds:  vitamin C  1,000 mg Oral Daily   docusate sodium  100 mg Oral BID   ezetimibe  10 mg Oral Daily   gabapentin  600 mg Oral BID   insulin aspart  0-15 Units Subcutaneous TID WC   insulin aspart  0-5 Units Subcutaneous QHS   insulin glargine-yfgn  30 Units Subcutaneous Daily   lisinopril  20 mg Oral Daily   metoprolol tartrate  25 mg Oral BID   multivitamin with minerals  1 tablet Oral Daily   nutrition supplement (JUVEN)  1 packet Oral BID BM   nystatin cream   Topical BID   pantoprazole  40 mg Oral Daily   sertraline  50 mg Oral Daily   warfarin  5 mg Oral ONCE-1600   Warfarin - Pharmacist Dosing Inpatient   Does not apply q1600   zinc sulfate  220 mg Oral Daily   Continuous Infusions:  sodium chloride 20 mL/hr at 01/31/23 0900   heparin Stopped (01/31/23 1323)   magnesium sulfate bolus IVPB     methocarbamol (ROBAXIN) IV      vancomycin Stopped (01/31/23 0714)   Imaging and lab data was personally reviewed No results found.  LOS: 8 days   Marcellus Scott, MD,  FACP, [facility], [facility], [facility], [facility]   [facility] Ad[facility] [place]     To contact the attending provider between 7A-7P or the covering provider during after hours 7P-7A, please log into the web site [email or web address] and access using universal [place] password for that web site. If you do not have the password, please call the hospital operator.

## 2023-01-31 NOTE — Progress Notes (Signed)
OT Cancellation Note  Patient Details Name: Adam Long MRN: [id number] DOB: [birth year]   Cancelled Treatment:    Reason Eval/Treat Not Completed: Medical issues which prohibited therapy.  RN troubleshooting wound vac issues, OT to continue efforts.    Dequita Schleicher D Katelinn Justice 01/31/2023, 2:19 PM 01/31/2023  RP, OTR/L  Acute Rehabilitation Services  Office:  [phone]

## 2023-01-31 NOTE — Progress Notes (Signed)
ANTICOAGULATION CONSULT NOTE - Follow Up Consult  Pharmacy Consult for heparin bridge to warfarin Indication:  mechanical AVR  No Known Allergies  Patient Measurements: Height: 5\' 10"  (177.8 cm) Weight: (!) 171.2 kg (377 lb 6.8 oz) IBW/kg (Calculated) : 73 Heparin Dosing Weight: 112.8 kg  Vital Signs: Temp: 98.4 F (36.9 C) (05/06 1635) Temp Source: Oral (05/06 1635) BP: 147/78 (05/06 1635) Pulse Rate: 71 (05/06 1635)  Labs: Recent Labs    01/30/23 0351 01/30/23 1153 01/31/23 0437 01/31/23 1411 01/31/23 1747  HGB 10.8*  --  9.7* 8.3* 8.2*  HCT 33.1*  --  29.5* 26.7* 26.5*  PLT 283  --  289 327  --   LABPROT 14.8  --  14.7  --   --   INR 1.2  --  1.2  --   --   HEPARINUNFRC 0.50 0.58 0.54  --   --   CREATININE 0.85  --   --   --   --      Estimated Creatinine Clearance: 172.5 mL/min (by C-G formula based on SCr of 0.85 mg/dL).  Assessment: 47 yo M who presents with cellulitis and abscess of L BKA stump and initiated on broad-spectrum antibiotics. S/p surgical I&D on 5/1 with orthopedic surgery. Patient has a mechanical AVR and taking warfarin 10mg  T/R/Sa/Su and 5mg  on MWF PTA. This regimen was last reviewed in outpt anticoag clinic on 4/9 with therapeutic INR of 2.5. Pt reports last dose of warfarin was ~1 wk prior to admission. INR on admission was 1.3, subtherapeutic. Warfarin was held on admission in anticipation for possible surgery. Pharmacy consulted to dose heparin infusion for mechanical AVR. Patient is now s/p L amputation below knee revision on 5/4, cleared by Dr. Lajoyce Corners to resume heparin/warfarin bridge post-op.   5/6 PM update: Patient heparin has been on hold for 4 hours. Bleeding has slowed. Hgb at 1800 is 8.2 (stable from previous hgb at 1411 at 8.3), but down from AM hgb at 9.7. D/W Dr. Waymon Amato, will restart heparin at previous rate at 1830 and will recheck Hgb at 2200 and 0200. Will decrease therapeutic range to 0.3-0.5 for active bleeding. Heparin level at  0200 with CBC. Will continue with warfarin 5mg  today since not therapeutic.   Goal of Therapy:  Heparin level 0.3-0.5 Monitor platelets by anticoagulation protocol: Yes   Plan:  Cont heparin 3450 units/hr Warfarin 5 mg today CBC at 2200 and CBC and Hl at 0200 Daily heparin level, INR, and CBC Monitor for signs/symptoms of bleeding F/U discharge plans to transition bridge to lovenox  Ajdin Macke A. Jeanella Craze, PharmD, BCPS, FNKF Clinical Pharmacist [place] Please utilize Amion for appropriate phone number to reach the unit pharmacist [facility])

## 2023-01-31 NOTE — Consult Note (Addendum)
[facility]    Date of Admission:  01/23/2023   Total days of inpatient antibiotics 8        Reason for Consult: BKA infection    Principal Problem:   Cellulitis and abscess of left leg Active Problems:   HTN (hypertension)   Hyperlipidemia associated with type 2 diabetes mellitus (HCC)   Class 3 obesity (HCC)   S/P aortic valve replacement with mechanical valve   Anxiety   Dehiscence of amputation stump of left lower extremity (HCC)   Uncontrolled type 2 diabetes mellitus with hyperglycemia (HCC)   CAD (coronary artery disease)   GERD (gastroesophageal reflux disease)   Hypokalemia   Assessment: 47 year old male with uncontrolled diabetes, left BKA accompanied with poor wound healing status post revision x 2, history of MSSA and group B strep bacteremia, mechanical aortic valve on Coumadin admitted with:  #Left BKA abscess status post I&D x 2 with Cx+ MRSA on 01/26/23 - Patient notes a few days prior to presentation he had been using his prosthesis and noted irritation. -On arrival vital stable, WBC 10.6 K.  Started on meropenem and vancomycin given MDR micronutrients and history as below.  CT showed open wound along lateral distal stump with fluid collections and gas along the distal aspect of the suspicious for abscess largest component measuring 4.1X 2.6 cm.  Cortical thickening and periosteal bone formation along amputation margin. - Taken to the OR on 5/1 with Ortho Dr. Lajoyce Corners for I&D noted that infection was to the bone with cultures growing MRSA.  There was a wound swab on 4/28 which noted MRSA as well as Candida albicans.  As Candida did not grow in OR cultures and was not treated,, likely contaminant. - Repeat I&D on 5/4, no cultures obtained.  #Left BKA complicated by poor wound healing status post revision x 2 - Initially underwent BKA on 08/28/2021.  He had a left diabetic foot infection complicated by MSSA and GBS bacteremia in the  setting of mechanical aortic valve.  TEE negative for vegetation.  Treated with cefazolin x 4 weeks. - First erosion with cultures growing E. coli, Proteus mirabilis, Enterococcus discharged with doxycycline, placed on Bactrim due to poor wound healing - Underwent second BKA revision on 3/21, cultures polymicrobial with coli Klebsiella MDR and Clostridium perfringens treated with on ertapenem and amoxicillin x 6 weeks EOT 02/10/2022.  Hyphae-noted on Gram stain, although no yeast/fungus grew on cultures as such not treated.  #Uncontrolled diabetes mellitus - A1c 13.4 on 01/24/2023 - Needs glycemic control for optimal wound healing  Recommendations:  -Place PICC - D/C vancomycin tomorrow - Start daptomycin tomorrow -Will plan on 6 weeks antibiotics from the OR on 5/4 - Sent for daptomycin sensitivities - Follow-up with myself on 5/21 - ID will sign off   OPAT ORDERS:  Diagnosis: BKA infeciton  Culture Result: MRSA  No Known Allergies   Discharge antibiotics to be given via PICC line:  Per pharmacy protocol daptomycin 8 mg /kg/day   Duration: 6 weeks End Date: 03/25/23  [facility] Care Per Protocol with [employer] Use: Home health RN for IV administration and teaching, line care and labs.    Labs weekly while on IV antibiotics: _x_ CBC with differential __ BMP **TWICE WEEKLY ON VANCOMYCIN  __x CMP __x CRP _x_ ESR __ Vancomycin trough TWICE WEEKLY __x CK  __ Please pull PIC at completion of IV antibiotics _x_ Please leave PIC in place until doctor has  seen patient or been notified  Fax weekly labs to [phone]  Clinic Follow Up Appt: 02/15/23  @ [facility] with Dr. Thedore Mins    I have personally spent 97 minutes involved in face-to-face and non-face-to-face activities for this patient on the day of the visit. Professional time spent includes the following activities: Preparing to see the patient (review of tests), Obtaining and/or reviewing separately obtained history  (admission/discharge record), Performing a medically appropriate examination and/or evaluation , Ordering medications/tests/procedures, referring and communicating with other health care professionals, Documenting clinical information in the EMR, Independently interpreting results (not separately reported), Communicating results to the patient/family/caregiver, Counseling and educating the patient/family/caregiver and Care coordination (not separately reported).   Microbiology:   Antibiotics: Merrem 4/28-5/2 Vanc 4/28-  Cultures: Other 01/26/23 MRSA  HPI: Adam Long is a 47 y.o. male with past medical history of type 2 diabetes, CAD, hypertension, AVR, dyslipidemia, left BKA status post revision x 2 treated with ertapenem and amoxicillin to complete 6 weeks of antibiotics EOT 02/10/2022, morbid obesity presented with left BKA stump pain.  CT showed fluid collection around lateral aspect of stump with some corticosteroid pending.  Taken to the OR with Ortho on 5/1 with cultures growing MRSA.  ID engaged for antibiotic condition's   Review of Systems: Review of Systems  All other systems reviewed and are negative.   Past Medical History:  Diagnosis Date   Anxiety    Aortic stenosis    Cellulitis and abscess of lower extremity 06/11/2019   Cellulitis of fourth toe of left foot    Cholelithiasis    Coronary artery disease    Nonobstructive CAD (40-50% LAD) 08/2019   Depression    Diabetes mellitus without complication (HCC)    Phreesia 09/27/2020   Elevated troponin level not due myocardial infarction 11/11/2019   Essential hypertension    Gangrene of toe of left foot (HCC) 07/06/2019   Heart murmur    Phreesia 09/27/2020   Hyperlipidemia    Phreesia 09/27/2020   Hypertension    Phreesia 09/27/2020   Mixed hyperlipidemia    Morbid obesity (HCC)    S/P aortic valve replacement with mechanical valve 12/05/2019   25 mm Carbomedics top hat bileaflet mechanical valve via partial  upper hemi-sternotomy   Severe aortic stenosis 09/24/2019   Type 2 diabetes mellitus (HCC)     Social History   Tobacco Use   Smoking status: Never   Smokeless tobacco: Former    Types: Chew    Quit date: 2021  Vaping Use   Vaping Use: Never used  Substance Use Topics   Alcohol use: Yes    Alcohol/week: 4.0 standard drinks of alcohol    Types: 4 Cans of beer per week    Comment: occasionally   Drug use: No    Family History  Problem Relation Age of Onset   Cancer Mother        Brain tumor   Heart disease Father    Hyperlipidemia Father    Hypertension Father    Stroke Father    Heart murmur Sister    Heart attack Maternal Grandmother    Liver disease Maternal Grandfather    Breast cancer Paternal Grandmother    COPD Paternal Grandfather    Heart attack Maternal Aunt    Colon cancer Maternal Uncle 67   Pancreatic cancer Paternal Aunt    Heart disease Paternal Uncle    Prostate cancer Neg Hx    Scheduled Meds:  vitamin C  1,000  mg Oral Daily   docusate sodium  100 mg Oral BID   ezetimibe  10 mg Oral Daily   gabapentin  600 mg Oral BID   insulin aspart  0-15 Units Subcutaneous TID WC   insulin aspart  0-5 Units Subcutaneous QHS   insulin glargine-yfgn  30 Units Subcutaneous Daily   lisinopril  20 mg Oral Daily   metoprolol tartrate  25 mg Oral BID   multivitamin with minerals  1 tablet Oral Daily   nutrition supplement (JUVEN)  1 packet Oral BID BM   nystatin cream   Topical BID   pantoprazole  40 mg Oral Daily   sertraline  50 mg Oral Daily   Warfarin - Pharmacist Dosing Inpatient   Does not apply q1600   zinc sulfate  220 mg Oral Daily   Continuous Infusions:  sodium chloride Stopped (01/31/23 0901)   heparin 3,450 Units/hr (01/31/23 1827)   magnesium sulfate bolus IVPB     methocarbamol (ROBAXIN) IV     vancomycin 150 mL/hr at 01/31/23 1824   PRN Meds:.acetaminophen, alum & mag hydroxide-simeth, bisacodyl, diphenhydrAMINE,  guaiFENesin-dextromethorphan, hydrALAZINE, HYDROmorphone (DILAUDID) injection, labetalol, magnesium citrate, magnesium sulfate bolus IVPB, methocarbamol **OR** methocarbamol (ROBAXIN) IV, metoCLOPramide **OR** metoCLOPramide (REGLAN) injection, metoprolol tartrate, ondansetron **OR** ondansetron (ZOFRAN) IV, oxyCODONE, oxyCODONE, phenol, polyethylene glycol, potassium chloride, sorbitol No Known Allergies  OBJECTIVE: Blood pressure 110/64, pulse 77, temperature 98.1 F (36.7 C), temperature source Oral, resp. rate 18, height 5\' 10"  (1.778 m), weight (!) 171.2 kg, SpO2 98 %.  Physical Exam Constitutional:      General: He is not in acute distress.    Appearance: He is normal weight. He is not toxic-appearing.  HENT:     Head: Normocephalic and atraumatic.     Right Ear: External ear normal.     Left Ear: External ear normal.     Nose: No congestion or rhinorrhea.     Mouth/Throat:     Mouth: Mucous membranes are moist.     Pharynx: Oropharynx is clear.  Eyes:     Extraocular Movements: Extraocular movements intact.     Conjunctiva/sclera: Conjunctivae normal.     Pupils: Pupils are equal, round, and reactive to light.  Cardiovascular:     Rate and Rhythm: Normal rate and regular rhythm.     Heart sounds: No murmur heard.    No friction rub. No gallop.  Pulmonary:     Effort: Pulmonary effort is normal.     Breath sounds: Normal breath sounds.  Abdominal:     General: Abdomen is flat. Bowel sounds are normal.     Palpations: Abdomen is soft.  Musculoskeletal:        General: No swelling.     Cervical back: Normal range of motion and neck supple.     Comments: Left BKA stump sound vac  Skin:    General: Skin is warm and dry.  Neurological:     General: No focal deficit present.     Mental Status: He is oriented to person, place, and time.  Psychiatric:        Mood and Affect: Mood normal.     Lab Results Lab Results  Component Value Date   WBC 6.8 01/31/2023   HGB 8.2  (L) 01/31/2023   HCT 26.5 (L) 01/31/2023   MCV 89.9 01/31/2023   PLT 327 01/31/2023    Lab Results  Component Value Date   CREATININE 0.85 01/30/2023   BUN 10 01/30/2023   NA 131 (  L) 01/30/2023   K 3.5 01/30/2023   CL 101 01/30/2023   CO2 22 01/30/2023    Lab Results  Component Value Date   ALT 25 01/24/2023   AST 25 01/24/2023   ALKPHOS 57 01/24/2023   BILITOT 0.8 01/24/2023       Danelle Earthly, MD [facility] 01/31/2023, 9:55 PM

## 2023-01-31 NOTE — Progress Notes (Signed)
This morning patient wound vac canister was full, had to order canisters none on unit, wound vac was off for 1.5 hours, New canister placed, leak was trouble-shooted with Dr. Lajoyce Corners via securechat. Pt was on heparin and once wound vac was restarted blood was pooling under dressing, Lajoyce Corners was paged for that. Later on pt was then sitting in a pool of blood, Heparin was stopped MD Hongalgi and Duda's PA were paged and at bedside to re-secure wound vac dressing. BP has been stable, new orders were placed and carried through.   Balinda Quails, RN 01/31/2023 3:59 PM

## 2023-01-31 NOTE — Progress Notes (Signed)
Physical Therapy Treatment Patient Details Name: Adam Long MRN: [id number] DOB: [birth year] Today's Date: 01/31/2023   History of Present Illness 47 y.o. male presents 01/24/23 with a chief complaint of left residual lower extremity pain and draining wound. +cellulitis and abscess. 5/01 I&D of L BKA. Pt returned to OR 5/4 for L BKA revision and wound vac placement. PMH includes: anxiety, aortic stenosis, CAD, depression, DM II, HTN, HLD, morbid obesity, AVR, amputations of L toes -> L BKA in 12/22    PT Comments    Pt demonstrates mod I bed mobility. He requires min assist sit to stand (for RW stabilization), and min guard assist SPT with RW. Pt performed BLE exercises seated in recliner. Pt in recliner with feet elevated at end of session. Pt plans to mobilize primarily at w/c level upon return home. He reports no concerns regarding mobility at home.    Recommendations for follow up therapy are one component of a multi-disciplinary discharge planning process, led by the attending physician.  Recommendations may be updated based on patient status, additional functional criteria and insurance authorization.  Follow Up Recommendations       Assistance Recommended at Discharge PRN  Patient can return home with the following Assistance with cooking/housework;Assist for transportation;Help with stairs or ramp for entrance   Equipment Recommendations  Other (comment);Rolling walker (2 wheels) (bariatric RW)    Recommendations for Other Services       Precautions / Restrictions Precautions Precautions: Fall;Other (comment) Precaution Comments: wound vac L residual limb Restrictions Weight Bearing Restrictions: No     Mobility  Bed Mobility Overal bed mobility: Modified Independent                  Transfers Overall transfer level: Needs assistance Equipment used: Rolling walker (2 wheels) Transfers: Sit to/from Stand Sit to Stand: Min assist   Step pivot transfers:  Min guard       General transfer comment: assist to stabilize RW for STS. Increased time.    Ambulation/Gait                   Stairs             Wheelchair Mobility    Modified Rankin (Stroke Patients Only)       Balance Overall balance assessment: Needs assistance Sitting-balance support: No upper extremity supported, Feet supported Sitting balance-Leahy Scale: Good     Standing balance support: Bilateral upper extremity supported, Reliant on assistive device for balance, During functional activity Standing balance-Leahy Scale: Poor                              Cognition Arousal/Alertness: Awake/alert Behavior During Therapy: WFL for tasks assessed/performed Overall Cognitive Status: Within Functional Limits for tasks assessed                                          Exercises General Exercises - Lower Extremity Long Arc Quad: AROM, Seated, Right, 10 reps Hip ABduction/ADduction: AROM, Right, 10 reps, Seated Hip Flexion/Marching: AROM, Right, 10 reps, Seated Amputee Exercises Quad Sets: AROM, Left, 10 reps, Seated Hip ABduction/ADduction: AROM, Left, 10 reps, Seated Hip Flexion/Marching: AROM, Left, 10 reps, Seated Knee Flexion: AROM, Left, 10 reps, Seated Knee Extension: AROM, Left, 10 reps, Seated    General Comments  Pertinent Vitals/Pain Pain Assessment Pain Assessment: Faces Faces Pain Scale: Hurts a little bit Pain Location: L residual limb Pain Descriptors / Indicators: Discomfort Pain Intervention(s): Premedicated before session    Home Living                          Prior Function            PT Goals (current goals can now be found in the care plan section) Acute Rehab PT Goals Patient Stated Goal: home Progress towards PT goals: Progressing toward goals    Frequency    Min 4X/week      PT Plan Current plan remains appropriate    Co-evaluation               AM-PAC PT "6 Clicks" Mobility   Outcome Measure  Help needed turning from your back to your side while in a flat bed without using bedrails?: None Help needed moving from lying on your back to sitting on the side of a flat bed without using bedrails?: None Help needed moving to and from a bed to a chair (including a wheelchair)?: A Little Help needed standing up from a chair using your arms (e.g., wheelchair or bedside chair)?: A Little Help needed to walk in hospital room?: Total Help needed climbing 3-5 steps with a railing? : Total 6 Click Score: 16    End of Session   Activity Tolerance: Patient tolerated treatment well Patient left: in chair;with call bell/phone within reach Nurse Communication: Mobility status;Other (comment) (wound vac canister full) PT Visit Diagnosis: Difficulty in walking, not elsewhere classified (R26.2)     Time: 1050-1107 PT Time Calculation (min) (ACUTE ONLY): 17 min  Charges:  $Therapeutic Exercise: 8-22 mins                     Ferd Glassing., PT  Office # [phone]    Ilda Foil 01/31/2023, 12:18 PM

## 2023-01-31 NOTE — Progress Notes (Addendum)
ANTICOAGULATION CONSULT NOTE - Follow Up Consult  Pharmacy Consult for heparin bridge to warfarin Indication:  mechanical AVR  No Known Allergies  Patient Measurements: Height: 5\' 10"  (177.8 cm) Weight: (!) 171.2 kg (377 lb 6.8 oz) IBW/kg (Calculated) : 73 Heparin Dosing Weight: 112.8 kg  Vital Signs: Temp: 98 F (36.7 C) (05/06 0421) Temp Source: Oral (05/06 0421) BP: 132/71 (05/06 0421) Pulse Rate: 63 (05/06 0421)  Labs: Recent Labs    01/29/23 0112 01/29/23 2029 01/30/23 0351 01/30/23 1153 01/31/23 0437  HGB 10.1*  --  10.8*  --  9.7*  HCT 31.8*  --  33.1*  --  29.5*  PLT 278  --  283  --  289  LABPROT  --   --  14.8  --  14.7  INR  --   --  1.2  --  1.2  HEPARINUNFRC <0.10*   < > 0.50 0.58 0.54  CREATININE  --   --  0.85  --   --    < > = values in this interval not displayed.     Estimated Creatinine Clearance: 172.5 mL/min (by C-G formula based on SCr of 0.85 mg/dL).  Assessment: 47 yo M who presents with cellulitis and abscess of L BKA stump and initiated on broad-spectrum antibiotics. S/p surgical I&D on 5/1 with orthopedic surgery. Patient has a mechanical AVR and taking warfarin 10mg  T/R/Sa/Su and 5mg  on MWF PTA. This regimen was last reviewed in outpt anticoag clinic on 4/9 with therapeutic INR of 2.5. Pt reports last dose of warfarin was ~1 wk prior to admission. INR on admission was 1.3, subtherapeutic. Warfarin was held on admission in anticipation for possible surgery. Pharmacy consulted to dose heparin infusion for mechanical AVR. Patient is now s/p L amputation below knee revision on 5/4, cleared by Dr. Lajoyce Corners to resume heparin/warfarin bridge post-op.  Heparin level had second therapeutic level at 0.58. INR is subtherapeutic at 1.2. Will continue his home regimen and give 10 mg today. CBC is stable. No known s/sx of bleeding.  5/6 AM update: HL: 0.54 INR: 1.2 No signs of bleeding or issues with infusion  Goal of Therapy:  Heparin level 0.3-0.7  units/ml Monitor platelets by anticoagulation protocol: Yes   Plan:  Cont heparin 3450 units/hr Warfarin 5 mg today Daily heparin level, INR, and CBC Monitor for signs/symptoms of bleeding F/U discharge plans to transition bridge to lovenox  Mardee Clune BS, PharmD, BCPS Clinical Pharmacist 01/31/2023 9:40 AM  Contact: [phone] after 3 PM  "Be curious, not judgmental..." -Debbora Dus  ADDENDUM 01/31/2023 1330 Per RN: Wound vac cannister was changed and it's putting out pretty bright red blood and in the new canister its filled to and still actively pulling blood continuously. Dr. Lajoyce Corners recommended holding heparin infusion x2 hours and then f/up on wound vac output.  Heparin infusion stopped ~1330 Follow-up at 1530 on ability to restart heparin infusion. Handed off to evening clinical Pharmacy staff  Greta Doom BS, PharmD, BCPS Clinical Pharmacist 01/31/2023 1:28 PM  Contact: [phone] after 3 PM  "Be curious, not judgmental..." -Debbora Dus

## 2023-02-01 DIAGNOSIS — L02416 Cutaneous abscess of left lower limb: Secondary | ICD-10-CM | POA: Diagnosis not present

## 2023-02-01 DIAGNOSIS — L03116 Cellulitis of left lower limb: Secondary | ICD-10-CM | POA: Diagnosis not present

## 2023-02-01 LAB — CBC
HCT: 23.7 % — ABNORMAL LOW (ref 39.0–52.0)
HCT: 24.7 % — ABNORMAL LOW (ref 39.0–52.0)
Hemoglobin: 7.7 g/dL — ABNORMAL LOW (ref 13.0–17.0)
Hemoglobin: 7.6 g/dL — ABNORMAL LOW (ref 13.0–17.0)
MCH: 28.5 pg (ref 26.0–34.0)
MCH: 28 pg (ref 26.0–34.0)
MCHC: 32.5 g/dL (ref 30.0–36.0)
MCHC: 30.8 g/dL (ref 30.0–36.0)
MCV: 87.8 fL (ref 80.0–100.0)
MCV: 91.1 fL (ref 80.0–100.0)
Platelets: 277 10*3/uL (ref 150–400)
Platelets: 322 10*3/uL (ref 150–400)
RBC: 2.7 MIL/uL — ABNORMAL LOW (ref 4.22–5.81)
RBC: 2.71 MIL/uL — ABNORMAL LOW (ref 4.22–5.81)
RDW: 13.8 % (ref 11.5–15.5)
RDW: 13.9 % (ref 11.5–15.5)
WBC: 6.5 10*3/uL (ref 4.0–10.5)
WBC: 6.9 10*3/uL (ref 4.0–10.5)
nRBC: 0.6 % — ABNORMAL HIGH (ref 0.0–0.2)
nRBC: 0.3 % — ABNORMAL HIGH (ref 0.0–0.2)

## 2023-02-01 LAB — PROTIME-INR
INR: 1.2 (ref 0.8–1.2)
Prothrombin Time: 15.5 seconds — ABNORMAL HIGH (ref 11.4–15.2)

## 2023-02-01 LAB — CK: Total CK: 25 U/L — ABNORMAL LOW (ref 49–397)

## 2023-02-01 LAB — PREPARE RBC (CROSSMATCH)

## 2023-02-01 LAB — HEPARIN LEVEL (UNFRACTIONATED)
Heparin Unfractionated: 0.41 IU/mL (ref 0.30–0.70)
Heparin Unfractionated: 0.4 IU/mL (ref 0.30–0.70)

## 2023-02-01 LAB — GLUCOSE, CAPILLARY
Glucose-Capillary: 123 mg/dL — ABNORMAL HIGH (ref 70–99)
Glucose-Capillary: 156 mg/dL — ABNORMAL HIGH (ref 70–99)
Glucose-Capillary: 146 mg/dL — ABNORMAL HIGH (ref 70–99)
Glucose-Capillary: 167 mg/dL — ABNORMAL HIGH (ref 70–99)

## 2023-02-01 LAB — TYPE AND SCREEN: ABO/RH(D): A POS

## 2023-02-01 LAB — BPAM RBC: ISSUE DATE / TIME: 202405071722

## 2023-02-01 MED ORDER — CHLORHEXIDINE GLUCONATE CLOTH 2 % EX PADS
6.0000 | MEDICATED_PAD | Freq: Every day | CUTANEOUS | Status: DC
  Administered 2023-02-01 – 2023-02-03 (×3): 6 via TOPICAL

## 2023-02-01 MED ORDER — SODIUM CHLORIDE 0.9% IV SOLUTION: Freq: Once | INTRAVENOUS | Status: DC

## 2023-02-01 MED ORDER — SODIUM CHLORIDE 0.9 % IV SOLN
8.0000 mg/kg | Freq: Every day | INTRAVENOUS | Status: DC
  Administered 2023-02-01 – 2023-02-03 (×3): 900 mg via INTRAVENOUS
  Filled 2023-02-01 (×3): qty 18

## 2023-02-01 MED ORDER — DAPTOMYCIN IV (FOR PTA / DISCHARGE USE ONLY): 900.0000 mg | INTRAVENOUS | 0 refills | Status: DC

## 2023-02-01 MED ORDER — SODIUM CHLORIDE 0.9% FLUSH
10.0000 mL | Freq: Two times a day (BID) | INTRAVENOUS | Status: DC
  Administered 2023-02-01 – 2023-02-02 (×4): 10 mL

## 2023-02-01 MED ORDER — WARFARIN SODIUM 10 MG PO TABS
10.0000 mg | ORAL_TABLET | Freq: Once | ORAL | Status: AC
  Administered 2023-02-01: 10 mg via ORAL
  Filled 2023-02-01: qty 1

## 2023-02-01 MED ORDER — SODIUM CHLORIDE 0.9% FLUSH: 10.0000 mL | INTRAVENOUS | Status: DC | PRN

## 2023-02-01 MED ORDER — DAPTOMYCIN IV (FOR PTA / DISCHARGE USE ONLY)
900.0000 mg | INTRAVENOUS | 0 refills | Status: AC
Start: 2023-02-01 — End: 2023-03-25

## 2023-02-01 NOTE — Progress Notes (Signed)
ANTICOAGULATION CONSULT NOTE - Follow Up Consult  Pharmacy Consult for heparin bridge to warfarin Indication:  mechanical AVR  No Known Allergies  Patient Measurements: Height: 5\' 10"  (177.8 cm) Weight: (!) 171.2 kg (377 lb 6.8 oz) IBW/kg (Calculated) : 73 Heparin Dosing Weight: 112.8 kg  Vital Signs: Temp: 98.1 F (36.7 C) (05/06 2119) Temp Source: Oral (05/06 2119) BP: 110/64 (05/06 2119) Pulse Rate: 77 (05/06 2119)  Labs: Recent Labs    01/30/23 0351 01/30/23 1153 01/31/23 0437 01/31/23 1411 01/31/23 1747 01/31/23 2157 01/31/23 2158 02/01/23 0305  HGB 10.8*  --  9.7* 8.3* 8.2* 7.9*  --  7.7*  HCT 33.1*  --  29.5* 26.7* 26.5* 24.2*  --  23.7*  PLT 283  --  289 327  --   --   --  277  LABPROT 14.8  --  14.7  --   --   --   --  15.5*  INR 1.2  --  1.2  --   --   --   --  1.2  HEPARINUNFRC 0.50   < > 0.54  --   --   --  0.26* 0.41  CREATININE 0.85  --   --   --   --   --   --   --    < > = values in this interval not displayed.     Estimated Creatinine Clearance: 172.5 mL/min (by C-G formula based on SCr of 0.85 mg/dL).  Assessment: 47 yo M who presents with cellulitis and abscess of L BKA stump and initiated on broad-spectrum antibiotics. S/p surgical I&D on 5/1 with orthopedic surgery. Patient has a mechanical AVR and taking warfarin 10mg  T/R/Sa/Su and 5mg  on MWF PTA. This regimen was last reviewed in outpt anticoag clinic on 4/9 with therapeutic INR of 2.5. Pt reports last dose of warfarin was ~1 wk prior to admission. INR on admission was 1.3, subtherapeutic. Warfarin was held on admission in anticipation for possible surgery. Pharmacy consulted to dose heparin infusion for mechanical AVR. Patient is now s/p L amputation below knee revision on 5/4, cleared by Dr. Lajoyce Corners to resume heparin/warfarin bridge post-op.   5/6 PM update: Patient heparin has been on hold for 4 hours. Bleeding has slowed. Hgb at 1800 is 8.2 (stable from previous hgb at 1411 at 8.3), but down  from AM hgb at 9.7. D/W Dr. Waymon Amato, will restart heparin at previous rate at 1830 and will recheck Hgb at 2200 and 0200. Will decrease therapeutic range to 0.3-0.5 for active bleeding. Heparin level at 0200 with CBC. Will continue with warfarin 5mg  today since not therapeutic.    5/7 AM update:  Heparin level therapeutic Hgb lower but looks like starting to stabilize  INR 1.2  Goal of Therapy:  Heparin level 0.3-0.5 units/mL Monitor platelets by anticoagulation protocol: Yes   Plan:  Cont heparin 3450 units/hr Heparin level at 1200 Watch Hgb closely  Abran Duke, PharmD, BCPS Clinical Pharmacist Phone: [phone]

## 2023-02-01 NOTE — Progress Notes (Signed)
Physical Therapy Treatment Patient Details Name: Adam Long MRN: [id number] DOB: 03/26/1976 Today's Date: 02/01/2023   History of Present Illness 47 y.o. male presents 01/24/23 with a chief complaint of left residual lower extremity pain and draining wound. +cellulitis and abscess. 5/01 I&D of L BKA. Pt returned to OR 5/4 for L BKA revision and wound vac placement. PMH includes: anxiety, aortic stenosis, CAD, depression, DM II, HTN, HLD, morbid obesity, AVR, amputations of L toes -> L BKA in 12/22    PT Comments    Pt demonstrating gradual progression but does fatigue easily.  He was able to stand pivot with min guard and hopped 5' with RW and min guard for stability. He declined further activity due to fatigue (Did note low hgb 7.6 and pt to get PRBC).  Pt expresses that he is confident in his ability to return home.  Did change recommendation to include further therapy as pt is below baseline and continues to fatigue easily.      Recommendations for follow up therapy are one component of a multi-disciplinary discharge planning process, led by the attending physician.  Recommendations may be updated based on patient status, additional functional criteria and insurance authorization.  Follow Up Recommendations       Assistance Recommended at Discharge PRN  Patient can return home with the following Assistance with cooking/housework;Assist for transportation;Help with stairs or ramp for entrance   Equipment Recommendations  Other (comment) (bariatric RW)    Recommendations for Other Services       Precautions / Restrictions Precautions Precautions: Fall;Other (comment) Precaution Comments: wound vac L residual limb Restrictions Weight Bearing Restrictions: Yes LLE Weight Bearing: Non weight bearing     Mobility  Bed Mobility Overal bed mobility: Needs Assistance Bed Mobility: Sit to Supine       Sit to supine: Supervision   General bed mobility comments: +rail,  increased time, HOB elevated    Transfers Overall transfer level: Needs assistance Equipment used: Rolling walker (2 wheels) Transfers: Sit to/from Stand Sit to Stand: Supervision   Step pivot transfers: Min guard       General transfer comment: Cues for safety; min guard for safety    Ambulation/Gait Ambulation/Gait assistance: Min guard Gait Distance (Feet): 5 Feet Assistive device: Rolling walker (2 wheels) Gait Pattern/deviations: Step-to pattern, Decreased stride length Gait velocity: decreased     General Gait Details: Pt able to take a few small hops on  R LE with min guard for stability, cues for safety Further distance limited by fatigue   Stairs             Wheelchair Mobility    Modified Rankin (Stroke Patients Only)       Balance Overall balance assessment: Needs assistance Sitting-balance support: No upper extremity supported, Feet supported Sitting balance-Leahy Scale: Good     Standing balance support: Bilateral upper extremity supported, Reliant on assistive device for balance, During functional activity Standing balance-Leahy Scale: Fair Standing balance comment: Using RW but also balancing on R LE only                            Cognition Arousal/Alertness: Awake/alert Behavior During Therapy: WFL for tasks assessed/performed Overall Cognitive Status: Within Functional Limits for tasks assessed  Exercises Amputee Exercises Quad Sets: AROM, Left, 10 reps, Seated Hip ABduction/ADduction: AROM, Left, 10 reps, Seated Hip Flexion/Marching: AROM, Left, 10 reps, Seated Knee Flexion: AROM, Left, 10 reps, Seated Knee Extension: AROM, Left, 10 reps, Seated Other Exercises Other Exercises: Gentle ROM on all as pt reports increased bleeding yesterday with exercises    General Comments  Educated on safety.  Recommendation for mostly using w/c at home and only RW for  transfers.  Recommending assist for stairs for supervision.  Pt continues to express that stairs are very small and no problem.  States he managed for 7 months without a leg.  Did educate on NWB status.      Pertinent Vitals/Pain Pain Assessment Pain Assessment: Faces Faces Pain Scale: Hurts a little bit Pain Location: L residual limb Pain Descriptors / Indicators: Discomfort Pain Intervention(s): Limited activity within patient's tolerance, Monitored during session, Patient requesting pain meds-RN notified    Home Living Family/patient expects to be discharged to:: Private residence Living Arrangements: Other relatives Available Help at Discharge: Family;Friend(s);Available PRN/intermittently Type of Home: House Home Access: Stairs to enter Entrance Stairs-Rails: None Entrance Stairs-Number of Steps: 2 short steps Alternate Level Stairs-Number of Steps: flight Home Layout: Two level;Able to live on main level with bedroom/bathroom Home Equipment: Rolling Walker (2 wheels);BSC/3in1;Wheelchair - manual Additional Comments: Pt asked for RW,  was delivered a standard walker, needs bariatric RW.    Prior Function            PT Goals (current goals can now be found in the care plan section) Progress towards PT goals: Progressing toward goals    Frequency    Min 4X/week      PT Plan Current plan remains appropriate    Co-evaluation              AM-PAC PT "6 Clicks" Mobility   Outcome Measure  Help needed turning from your back to your side while in a flat bed without using bedrails?: None Help needed moving from lying on your back to sitting on the side of a flat bed without using bedrails?: None Help needed moving to and from a bed to a chair (including a wheelchair)?: A Little Help needed standing up from a chair using your arms (e.g., wheelchair or bedside chair)?: A Little Help needed to walk in hospital room?: Total Help needed climbing 3-5 steps with a  railing? : Total 6 Click Score: 16    End of Session Equipment Utilized During Treatment: Gait belt Activity Tolerance: Patient tolerated treatment well Patient left: with call bell/phone within reach;in bed;with bed alarm set Nurse Communication: Mobility status PT Visit Diagnosis: Difficulty in walking, not elsewhere classified (R26.2)     Time: 1610-9604 PT Time Calculation (min) (ACUTE ONLY): 20 min  Charges:  $Therapeutic Exercise: 8-22 mins                     Anise Salvo, PT Acute Rehab [facility] [phone]    Rayetta Humphrey 02/01/2023, 5:00 PM

## 2023-02-01 NOTE — Progress Notes (Signed)
PHARMACY CONSULT NOTE FOR:  OUTPATIENT  PARENTERAL ANTIBIOTIC THERAPY (OPAT)  Indication: BKA infection  Regimen: Daptomycin 900 mg IV every 24 hours  End date: 03/25/23  IV antibiotic discharge orders are pended. To discharging provider:  please sign these orders via discharge navigator,  Select New Orders & click on the button choice - Manage This Unsigned Work.     Thank you for allowing pharmacy to be a part of this patient's care.  Sharin Mons, PharmD, BCPS, BCIDP Infectious Diseases Clinical Pharmacist Phone: [phone] 02/01/2023, 8:16 AM

## 2023-02-01 NOTE — Progress Notes (Addendum)
Patient ID: Adam Long, male   DOB: Aug 08, 1976, 47 y.o.   MRN: [id number] Patient had acute bleeding during his heparin therapy.  The heparin was turned off and has been resumed.  There is no active bleeding at this time.  Hemoglobin 7.7 and patient is asymptomatic.  The dressing was reinforced.  Will plan to discontinue the wound VAC dressing at time of discharge.  Anticipate discharge when patient's INR is stabilized for his heart valve.  Patient will need antibiotic coverage for 3 to 4 weeks for the MRSA infection.  Bone was not involved.

## 2023-02-01 NOTE — Progress Notes (Signed)
Peripherally Inserted Central Catheter Placement  The IV Nurse has discussed with the patient and/or persons authorized to consent for the patient, the purpose of this procedure and the potential benefits and risks involved with this procedure.  The benefits include less needle sticks, lab draws from the catheter, and the patient may be discharged home with the catheter. Risks include, but not limited to, infection, bleeding, blood clot (thrombus formation), and puncture of an artery; nerve damage and irregular heartbeat and possibility to perform a PICC exchange if needed/ordered by physician.  Alternatives to this procedure were also discussed.  Bard Power PICC patient education guide, fact sheet on infection prevention and patient information card has been provided to patient /or left at bedside.    PICC Placement Documentation  PICC Single Lumen 02/01/23 Right Basilic 45 cm 0 cm (Active)  Indication for Insertion or Continuance of Line Prolonged intravenous therapies 02/01/23 0844  Exposed Catheter (cm) 0 cm 02/01/23 0844  Site Assessment Clean, Dry, Intact 02/01/23 0844  Line Status Flushed;Blood return noted;Saline locked 02/01/23 0844  Dressing Type Transparent 02/01/23 0844  Dressing Status Antimicrobial disc in place 02/01/23 0844  Safety Lock Not Applicable 02/01/23 0844  Line Care Connections checked and tightened 02/01/23 0844  Line Adjustment (NICU/IV Team Only) No 02/01/23 0844  Dressing Intervention New dressing 02/01/23 0844       Audrie Gallus 02/01/2023, 8:46 AM

## 2023-02-01 NOTE — TOC Progression Note (Addendum)
Transition of Care (TOC) - Progression Note   Per notes patient to discharge home on IV ABX Daptomycin for 6 weeks start date 01/29/23    Patient has done IV ABX at home before. At that time he had different insurance and [employer]. However ,current insurance not in network with [employer].   Infusion company will be [facility]. Patient will have PICC line and HHRN , however , HHRN will not be present every time dose is due. HHRN will provide education to patient.   Patient has a Prevena VAC. Patient has had a Prevena VAC in the past.    Patient voiced understanding to all of above.   Called [employer] for bariatric walker and 3 in 1   Called Rachael with [facility] faxed H and P and OP notes and ID note. When fax OPAT when signed.   In progression was told anticipated discharge date Thursday . Jacquelyne Balint phone [phone]   Fax [phone]   1145 Fleet Contras with [employer] , patient will have a $4.00 co pay a week for drug, supplies and nursing. Patient aware. Fleet Contras will call patient directly. Patient aware   OPAT script and PICC line note faxed to [facility]  Patient Details  Name: Adam Long MRN: [id number] Date of Birth: [birth year]  Transition of Care [facility]) CM/SW Contact  Vianka Ertel, Adria Devon, RN Phone Number: 02/01/2023, 8:03 AM  Clinical Narrative:       Expected Discharge Plan: Home/Self Care Barriers to Discharge: Continued Medical Work up  Expected Discharge Plan and Services   Discharge Planning Services: CM Consult Post Acute Care Choice: Durable Medical Equipment Living arrangements for the past 2 months: Single Family Home                 DME Arranged: Walker rolling ((bariatric RW)) DME Agency: [employer] Date DME Agency Contacted: 01/27/23 Time DME Agency Contacted: [phone] Representative spoke with at DME Agency: Lelon Mast HH Arranged: NA           Social Determinants of Health (SDOH) Interventions SDOH Screenings   Food Insecurity: No  Food Insecurity (01/24/2023)  Housing: Low Risk  (01/24/2023)  Transportation Needs: No Transportation Needs (01/24/2023)  Utilities: Not At Risk (01/24/2023)  Depression (PHQ2-9): Medium Risk (10/12/2022)  Tobacco Use: Medium Risk (01/31/2023)    Readmission Risk Interventions     No data to display

## 2023-02-01 NOTE — Evaluation (Signed)
Occupational Therapy Evaluation Patient Details Name: Adam Long MRN: [id number] DOB: 11-Dec-1975 Today's Date: 02/01/2023   History of Present Illness 47 y.o. male presents 01/24/23 with a chief complaint of left residual lower extremity pain and draining wound. +cellulitis and abscess. 5/01 I&D of L BKA. Pt returned to OR 5/4 for L BKA revision and wound vac placement. PMH includes: anxiety, aortic stenosis, CAD, depression, DM II, HTN, HLD, morbid obesity, AVR, amputations of L toes -> L BKA in 12/22   Clinical Impression   Pt is s/p above diagnosis. Pt PLOF independent with use of LLE prosthetic, works at [employer]. Due to current infection Pt was given wound vac to go home with to manage infection, per doctors request. Pt has 2 steps with no rails to enter home, states he was used to getting around with RW and w/c at home, and has managed getting around without prosthetic use in the past, feels confident about going home. Educated Pt on NWB precautions. Pt would benefit from continued skilled therapy to improve functional mobility and safety awareness, and educate on use of sock aide to increase independence with LB Dressing. No OT follow up needed.     Recommendations for follow up therapy are one component of a multi-disciplinary discharge planning process, led by the attending physician.  Recommendations may be updated based on patient status, additional functional criteria and insurance authorization.   Assistance Recommended at Discharge Set up Supervision/Assistance  Patient can return home with the following A little help with walking and/or transfers;A little help with bathing/dressing/bathroom;Assistance with cooking/housework;Assist for transportation;Help with stairs or ramp for entrance    Functional Status Assessment  Patient has had a recent decline in their functional status and demonstrates the ability to make significant improvements in function in a reasonable and  predictable amount of time.  Equipment Recommendations  None recommended by OT (standard walker needs to be replaced with bariatric RW, case manager already contacted.)    Recommendations for Other Services       Precautions / Restrictions Precautions Precautions: Fall;Other (comment) Precaution Comments: wound vac L residual limb Restrictions Weight Bearing Restrictions: No      Mobility Bed Mobility Overal bed mobility: Modified Independent             General bed mobility comments: +rail, increased time, HOB elevated    Transfers Overall transfer level: Needs assistance Equipment used: Rolling walker (2 wheels) Transfers: Sit to/from Stand Sit to Stand: Supervision     Step pivot transfers: Supervision     General transfer comment: doing better with transfers, increased ease and stability.      Balance Overall balance assessment: Needs assistance Sitting-balance support: No upper extremity supported, Feet supported Sitting balance-Leahy Scale: Good     Standing balance support: Bilateral upper extremity supported, Reliant on assistive device for balance, During functional activity Standing balance-Leahy Scale: Fair Standing balance comment: able to stand/transfer supervision, good overall balance and strength in RLE. Pt reliant on RW                           ADL either performed or assessed with clinical judgement   ADL Overall ADL's : Needs assistance/impaired Eating/Feeding: Independent   Grooming: Set up;Sitting           Upper Body Dressing : Set up   Lower Body Dressing: Minimal assistance   Toilet Transfer: Supervision/safety   Toileting- Clothing Manipulation and Hygiene: Supervision/safety;Sit to/from stand  Functional mobility during ADLs: Rolling walker (2 wheels);Supervision/safety General ADL Comments: Pt requires some assistance with don/doff RLE sock, to be instructed with ue of sock aide. Pt displays good  overall ability to perform transfers, supervision     Vision Baseline Vision/History: 1 Wears glasses Ability to See in Adequate Light: 0 Adequate Patient Visual Report: No change from baseline       Perception     Praxis      Pertinent Vitals/Pain Pain Assessment Pain Assessment: 0-10 Pain Score: 5  Faces Pain Scale: Hurts a little bit Pain Location: L residual limb Pain Descriptors / Indicators: Discomfort Pain Intervention(s): Monitored during session     Hand Dominance Right   Extremity/Trunk Assessment             Communication Communication Communication: No difficulties   Cognition Arousal/Alertness: Awake/alert Behavior During Therapy: WFL for tasks assessed/performed Overall Cognitive Status: Within Functional Limits for tasks assessed                                       General Comments       Exercises     Shoulder Instructions      Home Living Family/patient expects to be discharged to:: Private residence Living Arrangements: Other relatives Available Help at Discharge: Family;Friend(s);Available PRN/intermittently Type of Home: House Home Access: Stairs to enter [employer] of Steps: 2 short steps Entrance Stairs-Rails: None Home Layout: Two level;Able to live on main level with bedroom/bathroom Alternate Level Stairs-Number of Steps: flight Alternate Level Stairs-Rails: Right;Left;Can reach both Bathroom Shower/Tub: [employer]: Handicapped height Bathroom Accessibility: Yes How Accessible: Accessible via walker Home Equipment: Rolling Walker (2 wheels);BSC/3in1;Wheelchair - manual   Additional Comments: Pt asked for RW,  was delivered a standard walker, needs bariatric RW.      Prior Functioning/Environment Prior Level of Function : Independent/Modified Independent             Mobility Comments: was using prosthesis vs w/c for locomotion prior to infection; can do steps in/out  of house even without prosthesis by using his knee ADLs Comments: ind        OT Problem List: Decreased activity tolerance;Impaired balance (sitting and/or standing);Pain      OT Treatment/Interventions: Self-care/ADL training;Therapeutic exercise;Energy conservation;DME and/or AE instruction;Therapeutic activities    OT Goals(Current goals can be found in the care plan section) Acute Rehab OT Goals Patient Stated Goal: to return home and decrease pain OT Goal Formulation: With patient Time For Goal Achievement: 02/15/23 Potential to Achieve Goals: Good  OT Frequency: Min 2X/week    Co-evaluation              AM-PAC OT "6 Clicks" Daily Activity     Outcome Measure Help from another person eating meals?: None Help from another person taking care of personal grooming?: A Little Help from another person toileting, which includes using toliet, bedpan, or urinal?: A Little Help from another person bathing (including washing, rinsing, drying)?: A Little Help from another person to put on and taking off regular upper body clothing?: A Little Help from another person to put on and taking off regular lower body clothing?: A Little 6 Click Score: 19   End of Session Equipment Utilized During Treatment: Gait belt;Rolling walker (2 wheels) Nurse Communication: Mobility status  Activity Tolerance: Patient tolerated treatment well Patient left: in chair;with call bell/phone within reach;with chair  alarm set  OT Visit Diagnosis: Unsteadiness on feet (R26.81);Other abnormalities of gait and mobility (R26.89);Pain Pain - Right/Left: Left Pain - part of body: Leg                Time: 7829-5621 OT Time Calculation (min): 36 min Charges:  OT General Charges $OT Visit: 1 Visit OT Evaluation $OT Eval Moderate Complexity: 1 Mod OT Treatments $Self Care/Home Management : 8-22 mins  Carless Slatten, OTR/L   Alexis Goodell 02/01/2023, 3:22 PM

## 2023-02-01 NOTE — Progress Notes (Signed)
[facility] Progress Note  Patient: Adam Long     JYN:[id number]  DOA: 01/23/2023   PCP: [name], DO       Brief hospital course: 47 year old male with type 2 diabetes mellitus, uncontrolled, CAD, hypertension, AVR, dyslipidemia, left BKA with revision x 2, morbid obesity who presents to the hospital for left lower extremity wound caused by his prosthesis along with bleeding.  He stated he stopped wearing his prosthesis about 5 days before.  Has a history of MSSA bacteremia in the setting of a left diabetic foot ulcer in 2023 which was treated with a BKA and IV antibiotics.  Also has a history of E. coli, Proteus mirabilis and E faecalis and a separate infection with-a, E faecalis, Clostridium perfringens and multidrug-resistant Klebsiella pneumonia growing from the wound. CT scan showed findings suggestive of abscesses without osteomyelitis.  S/p irrigation and excisional debridement with wound VAC application 5/1.  S/p left BKA revision by Dr. Lajoyce Corners 5/4.  Postop resumed IV heparin bridging and restarted warfarin per pharmacy.      Subjective:  States that no significant external bleeding since last night.  Feels somewhat weak but no dizziness, lightheadedness, chest pain, dyspnea or palpitations.  Assessment and Plan: Principal Problem: Left BKA MRSA stump abscess -Multiple infections in the past requiring revisions - S/p irrigation and excisional debridement with wound VAC application 5/1 - S/p left BKA revision by Dr. Lajoyce Corners 5/4.  -Culture confirms MRSA.  Discontinued meropenem.  Continue vancomycin. -ID consultation 5/6 appreciated, discontinued vancomycin, started daptomycin and plan 6 weeks antibiotics from OR on 5/4, outpatient follow-up with Dr. Danelle Earthly on 5/21 - nutrition consulted to assist with nutrition to allow for wound healing  5/6: Significant postop bleeding from left BKA stump revision, ~550 mL in canister, heparin drip was temporarily held for  approximately 4 hours, dressing was reinforced, when bleeding significantly improved, heparin drip was resumed (mechanical AVR patient) along with Coumadin anticoagulation.   5/7: Left BKA stump site bleeding has significantly improved but still noted ~250-300 mL bloody fluid in wound VAC canister (probably most of it from yesterday).  Hemoglobin has dropped approximately 2 g from 9.7-7.6 in the last 24 hours.  Transfusing 1 unit PRBC.  Follow post transfusion CBC.  Active Problems:   Postop Acute Blood Loss Anemia Please see discussion above.  Approximately 2 g hemoglobin drop in the last 24 hours and somewhat symptomatic.  Also may have some bloody ooze from surgical site in the context of ongoing IV heparin drip and hemoglobin expected to drift down some more.  Thereby transfusing 1 unit PRBC, patient agreeable and consented.  Uncontrolled type 2 diabetes mellitus with hyperglycemia (HCC) -A1c 13.4-takes metformin at home Continue Semglee 30 units daily, changed moderate sensitivity NovoLog SSI to 3 times daily before meals and at bedtime.  Good inpatient control over the last 48 hours. - He will need new prescriptions when discharged, including a prescription for the continuous blood glucose monitor.  As per DM coordinator, plan to place CBG monitor on 5/3 (not sure if this was placed, will need to verify with DM coordinator).    HTN (hypertension) -Controlled on metoprolol and lisinopril.    CAD (coronary artery disease) No anginal symptoms.    Hyperlipidemia associated with type 2 diabetes mellitus (HCC) Continue Zetia.    S/P aortic valve replacement with mechanical valve Follows with Dr. Diona Browner and gets INR checks in [place].  Reports that last year, during a similar situation he was on  Lovenox bridging and Coumadin.  Preop, placed on IV heparin and warfarin was held.  INR 1.2.  Heparin bridging and warfarin per pharmacy resumed postop.  Communicated with Dr. Diona Browner on 5/6: He advised  that patient could likely go on Lovenox bridge for mechanical AVR.  INR 1.2.  Mild hyponatremia - appears chronically low    Anxiety - Zoloft    GERD (gastroesophageal reflux disease) -Protonix  Hypokalemia - Replaced.  Check BMP in AM.  Severe hypoalbuminemia - Albumin level 1.9.  Dietitian consulted.  Body mass index is 53.21 kg/m./Very morbid obesity Complicates care including wound healing from surgery, DM control etc.  ACP documents: None present.   Code Status: Full Code Consultants: orthopedic surgery DVT prophylaxis:  SCD's Start: 01/29/23 1313 SCDs Start: 01/26/23 1613 SCDs Start: 01/23/23 2131.  IV heparin bridge and Coumadin. Family communication: Patient has repeatedly declined MDs offer to speak with family to update care and answered any questions. warfarin (COUMADIN) tablet 10 mg     Objective:   Vitals:   01/31/23 2119 02/01/23 0521 02/01/23 0812 02/01/23 1622  BP: 110/64 113/65 132/63 126/65  Pulse: 77 64 74 69  Resp: 18 18 15 18   Temp: 98.1 F (36.7 C) 98 F (36.7 C) 98.4 F (36.9 C) 98 F (36.7 C)  TempSrc: Oral Oral Oral Oral  SpO2: 98% 100% 96% 98%  Weight:  (!) 168.2 kg    Height:       Filed Weights   01/30/23 0500 01/31/23 0421 02/01/23 0521  Weight: (!) 171.2 kg (!) 171.2 kg (!) 168.2 kg   Exam: General exam: Young male, moderately built and morbidly obese lying comfortably propped up in bed without distress. Respiratory system: Clear to auscultation. Respiratory effort normal.  Midline sternotomy scar. Cardiovascular system: S1 and S2 heard, RRR.  No JVD or murmurs.  No right lower extremity edema.  Crisp click of mechanical AVR heard. Gastrointestinal system: Abdomen is nondistended, soft and nontender. No organomegaly or masses felt. Normal bowel sounds heard. Central nervous system: Alert and oriented. No focal neurological deficits. Extremities: Symmetric 5 x 5 power.  Left BKA stump with wound VAC looks much better than  yesterday.  Dressing was reinforced by Dr. Tommie Ard earlier today.  No oozing or leaking of blood around the wound VAC.  Noted approximately 250-300 mL of bloody fluid and wound VAC canister. Skin: No rashes, lesions or ulcers Psychiatry: Judgement and insight appear normal. Mood & affect appropriate.       CBC: Recent Labs  Lab 01/30/23 0351 01/31/23 0437 01/31/23 1411 01/31/23 1747 01/31/23 2157 02/01/23 0305 02/01/23 1139  WBC 7.0 6.5 6.8  --   --  6.5 6.9  HGB 10.8* 9.7* 8.3* 8.2* 7.9* 7.7* 7.6*  HCT 33.1* 29.5* 26.7* 26.5* 24.2* 23.7* 24.7*  MCV 88.5 87.0 89.9  --   --  87.8 91.1  PLT 283 289 327  --   --  277 322   Basic Metabolic Panel: Recent Labs  Lab 01/26/23 0522 01/30/23 0351  NA 130* 131*  K 4.3 3.5  CL 99 101  CO2 25 22  GLUCOSE 306* 160*  BUN 10 10  CREATININE 0.84 0.85  CALCIUM 8.2* 7.9*  MG 1.9  --   PHOS 3.5  --    GFR: Estimated Creatinine Clearance: 170.6 mL/min (by C-G formula based on SCr of 0.85 mg/dL).  Scheduled Meds:  sodium chloride   Intravenous Once   vitamin C  1,000 mg Oral Daily   Chlorhexidine  Gluconate Cloth  6 each Topical Daily   docusate sodium  100 mg Oral BID   ezetimibe  10 mg Oral Daily   gabapentin  600 mg Oral BID   insulin aspart  0-15 Units Subcutaneous TID WC   insulin aspart  0-5 Units Subcutaneous QHS   insulin glargine-yfgn  30 Units Subcutaneous Daily   lisinopril  20 mg Oral Daily   metoprolol tartrate  25 mg Oral BID   multivitamin with minerals  1 tablet Oral Daily   nutrition supplement (JUVEN)  1 packet Oral BID BM   nystatin cream   Topical BID   pantoprazole  40 mg Oral Daily   sertraline  50 mg Oral Daily   sodium chloride flush  10-40 mL Intracatheter Q12H   warfarin  10 mg Oral ONCE-1600   Warfarin - Pharmacist Dosing Inpatient   Does not apply q1600   zinc sulfate  220 mg Oral Daily   Continuous Infusions:  sodium chloride Stopped (01/31/23 0901)   DAPTOmycin (CUBICIN) 900 mg in sodium chloride  0.9 % IVPB 900 mg (02/01/23 1251)   heparin 3,450 Units/hr (02/01/23 0951)   magnesium sulfate bolus IVPB     methocarbamol (ROBAXIN) IV     Imaging and lab data was personally reviewed [place] EKG SITE RITE  Result Date: 01/31/2023 If Site Rite image not attached, placement could not be confirmed due to current cardiac rhythm.   LOS: 9 days   Marcellus Scott, MD,  FACP, [facility], [facility], [facility], [facility]   [facility] [place]     To contact the attending provider between 7A-7P or the covering provider during after hours 7P-7A, please log into the web site [email or web address] and access using universal [place] password for that web site. If you do not have the password, please call the hospital operator.

## 2023-02-01 NOTE — Progress Notes (Addendum)
ANTICOAGULATION CONSULT NOTE - Follow Up Consult  Pharmacy Consult for heparin bridge to warfarin Indication:  mechanical AVR  No Known Allergies  Patient Measurements: Height: 5\' 10"  (177.8 cm) Weight: (!) 168.2 kg (370 lb 13 oz) IBW/kg (Calculated) : 73 Heparin Dosing Weight: 112.8 kg  Vital Signs: Temp: 98.4 F (36.9 C) (05/07 0812) Temp Source: Oral (05/07 0812) BP: 132/63 (05/07 0812) Pulse Rate: 74 (05/07 0812)  Labs: Recent Labs    01/30/23 0351 01/30/23 1153 01/31/23 0437 01/31/23 1411 01/31/23 1747 01/31/23 2157 01/31/23 2158 02/01/23 0305 02/01/23 1139  HGB 10.8*  --  9.7* 8.3*   < > 7.9*  --  7.7* 7.6*  HCT 33.1*  --  29.5* 26.7*   < > 24.2*  --  23.7* 24.7*  PLT 283  --  289 327  --   --   --  277 322  LABPROT 14.8  --  14.7  --   --   --   --  15.5*  --   INR 1.2  --  1.2  --   --   --   --  1.2  --   HEPARINUNFRC 0.50   < > 0.54  --   --   --  0.26* 0.41 0.40  CREATININE 0.85  --   --   --   --   --   --   --   --    < > = values in this interval not displayed.    Estimated Creatinine Clearance: 170.6 mL/min (by C-G formula based on SCr of 0.85 mg/dL).  Assessment: 47 yo M who presents with cellulitis and abscess of L BKA stump and initiated on broad-spectrum antibiotics. S/p surgical I&D on 5/1 with orthopedic surgery. Patient has a mechanical AVR and taking warfarin 10mg  T/R/Sa/Su and 5mg  on MWF PTA. This regimen was last reviewed in outpt anticoag clinic on 4/9 with therapeutic INR of 2.5. Pt reports last dose of warfarin was ~1 wk prior to admission. INR on admission was 1.3, subtherapeutic. Warfarin was held on admission in anticipation for possible surgery. Pharmacy consulted to dose heparin infusion for mechanical AVR. Patient is now s/p L amputation below knee revision on 5/4, cleared by Dr. Lajoyce Corners to resume heparin/warfarin bridge post-op.  5/7 afternoon update:  Per Dr. Audrie Lia note dated 5/7 10:26 "no active bleeding at this time. Hgb 7.7 and  patient is asymptomatic. Plan is to discontinue wound vac dressing at discharge which will happen once INR stabilizes"  Spoke with RN. Stated no bleeding episodes were noted during his shift, thus far.  Wound vac canister still has bloody drainage but volume is significantly less than 01/31/2023 when heparin gtt was held.   INR 1.2- unchanged since 01/30/2023 He has received coumadin 10 mg on 5/4 and 5/5; 5 mg on 5/6  HL: 0.40- therapeutic Hgb 7.6- stable   Goal of Therapy:  Heparin level 0.3-0.5 Monitor platelets by anticoagulation protocol: Yes   Plan:  Cont heparin 3450 units/hr Warfarin 10 mg today CBC at 2100 and CBC and HL at 0300 Daily heparin level, INR, and CBC Monitor for signs/symptoms of bleeding F/U discharge plans to transition bridge to lovenox  Shianna Bally BS, PharmD, BCPS Clinical Pharmacist 02/01/2023 1:31 PM  Contact: [phone] after 3 PM  "Be curious, not judgmental..." -Debbora Dus

## 2023-02-02 DIAGNOSIS — L03116 Cellulitis of left lower limb: Secondary | ICD-10-CM | POA: Diagnosis not present

## 2023-02-02 DIAGNOSIS — L02416 Cutaneous abscess of left lower limb: Secondary | ICD-10-CM | POA: Diagnosis not present

## 2023-02-02 LAB — GLUCOSE, CAPILLARY
Glucose-Capillary: 173 mg/dL — ABNORMAL HIGH (ref 70–99)
Glucose-Capillary: 171 mg/dL — ABNORMAL HIGH (ref 70–99)
Glucose-Capillary: 128 mg/dL — ABNORMAL HIGH (ref 70–99)
Glucose-Capillary: 129 mg/dL — ABNORMAL HIGH (ref 70–99)

## 2023-02-02 LAB — BPAM RBC
Blood Product Expiration Date: 202405292359
Unit Type and Rh: 6200

## 2023-02-02 LAB — BASIC METABOLIC PANEL
Anion gap: 5 (ref 5–15)
BUN: 10 mg/dL (ref 6–20)
CO2: 25 mmol/L (ref 22–32)
Calcium: 8.2 mg/dL — ABNORMAL LOW (ref 8.9–10.3)
Chloride: 103 mmol/L (ref 98–111)
Creatinine, Ser: 0.77 mg/dL (ref 0.61–1.24)
GFR, Estimated: >60 mL/min (ref >60)
Glucose, Bld: 168 mg/dL — ABNORMAL HIGH (ref 70–99)
Potassium: 3.5 mmol/L (ref 3.5–5.1)
Sodium: 133 mmol/L — ABNORMAL LOW (ref 135–145)

## 2023-02-02 LAB — PROTIME-INR
INR: 1.2 (ref 0.8–1.2)
Prothrombin Time: 15.2 seconds (ref 11.4–15.2)

## 2023-02-02 LAB — TYPE AND SCREEN
Antibody Screen: NEGATIVE
Unit division: 0

## 2023-02-02 LAB — CBC
HCT: 26.2 % — ABNORMAL LOW (ref 39.0–52.0)
Hemoglobin: 8.2 g/dL — ABNORMAL LOW (ref 13.0–17.0)
MCH: 28.1 pg (ref 26.0–34.0)
MCHC: 31.3 g/dL (ref 30.0–36.0)
MCV: 89.7 fL (ref 80.0–100.0)
Platelets: 310 10*3/uL (ref 150–400)
RBC: 2.92 MIL/uL — ABNORMAL LOW (ref 4.22–5.81)
RDW: 14.1 % (ref 11.5–15.5)
WBC: 6.8 10*3/uL (ref 4.0–10.5)
nRBC: 0.6 % — ABNORMAL HIGH (ref 0.0–0.2)

## 2023-02-02 LAB — HEPARIN LEVEL (UNFRACTIONATED)
Heparin Unfractionated: 0.52 IU/mL (ref 0.30–0.70)
Heparin Unfractionated: 0.34 IU/mL (ref 0.30–0.70)

## 2023-02-02 MED ORDER — WARFARIN SODIUM 10 MG PO TABS
10.0000 mg | ORAL_TABLET | Freq: Once | ORAL | Status: AC
  Administered 2023-02-02: 10 mg via ORAL
  Filled 2023-02-02: qty 1

## 2023-02-02 NOTE — Progress Notes (Signed)
[facility] Progress Note  Patient: Adam Long     JYN:[id number]  DOA: 01/23/2023   PCP: [name], DO       Brief hospital course: 47 year old male with type 2 diabetes mellitus, uncontrolled, nonobstructive CAD on 08/2019 cath, hypertension, AVR 2021 Dr. Lavinia Sharps, dyslipidemia, left BKA with revision x 2, morbid obesity who presents to the hospital for left lower extremity wound caused by his prosthesis along with bleeding.  He stated he stopped wearing his prosthesis about 5 days before.  H/o MSSA bacteremia in the setting of a left diabetic foot ulcer in 2023 which was treated with a BKA and IV antibiotics.   Also E. coli, Proteus mirabilis and E faecalis and a separate infection with-a, E faecalis, Clostridium perfringens and multidrug-resistant Klebsiella pneumonia growing from the wound.   CT scan showed findings suggestive of abscesses without osteomyelitis.  S/p irrigation and excisional debridement with wound VAC application 5/1.  S/p left BKA revision by Dr. Lajoyce Corners 5/4.  Postop resumed IV heparin bridging and restarted warfarin per pharmacy.    Subjective:  Looks well Pain manageable 6-7 on 10 + stool  Assessment and Plan: Principal Problem: Left BKA MRSA stump abscess -Multiple infections in the past requiring revisions - S/p irrigation and excisional debridement with wound VAC application 5/1 - S/p left BKA revision by Dr. Lajoyce Corners 5/4.  -Culture confirms MRSA. -ID consultation 5/6 appreciated, discontinued vancomycin-->daptomycin EOT = 03/25/2023 follow-up with Dr. Thedore Mins 5/21    Postop Acute Blood Loss Anemia Left BKA stump bleeding with 300 cc in canister-1 unit PRBC 5/7-hemoglobin stabilized Cautious titration heparin/Coumadin  Uncontrolled type 2 diabetes mellitus with hyperglycemia (HCC) poorly controlled A1c 13.4 on metformin Continue Semglee 30 units daily moderate SSI CBG = 1 28-1 70 continuous glucometer to be reinforced with  HTN  (hypertension) -Controlled on metoprolol and lisinopril.  CAD (coronary artery disease) No anginal symptoms.    Hyperlipidemia associated with type 2 diabetes mellitus (HCC) Continue Zetia.    S/P aortic valve replacement with mechanical valve Outpatient follow-up with [facility]-because of bleeding would use heparin > Lovenox as it is reversible quicker Likely can discharge once INR is therapeutic  Mild hyponatremia - appears chronically low but is improving steadily    Anxiety - Zoloft    GERD (gastroesophageal reflux disease) -Protonix  Hypokalemia - Replaced.   Severe hypoalbuminemia - Albumin level 1.9.  Dietitian consulted.  Body mass index is 53.17 kg/m./Very morbid obesity Complicates care including wound healing from surgery, DM control etc.  ACP documents: None present.   Code Status: Full Code Consultants: orthopedic surgery DVT prophylaxis:  SCD's Start: 01/29/23 1313 SCDs Start: 01/26/23 1613 SCDs Start: 01/23/23 2131.  IV heparin bridge and Coumadin. Family communication: Patient halone warfarin (COUMADIN) tablet 10 mg     Objective:   Vitals:   02/02/23 0430 02/02/23 0500 02/02/23 0818 02/02/23 1611  BP: 116/67  (!) 135/95 132/87  Pulse: 65  66 75  Resp:   16 16  Temp: 98.3 F (36.8 C)  98 F (36.7 C) 98.6 F (37 C)  TempSrc: Oral  Oral Oral  SpO2: 100%  97% 99%  Weight:  (!) 168.1 kg    Height:       Filed Weights   01/31/23 0421 02/01/23 0521 02/02/23 0500  Weight: (!) 171.2 kg (!) 168.2 kg (!) 168.1 kg   Exam:  Pleasant rotund white male no distress thick neck Mallampati 4 S1-S2 no murmur Abdomen soft no rebound no guarding ROM  intact Right lower extremity has scale over lateral aspect of calf Wound VAC in place over left stump well adherent Mild lower extremity pitting edema Euthymic coherent cognizant      CBC: Recent Labs  Lab 01/31/23 0437 01/31/23 1411 01/31/23 1747 01/31/23 2157 02/01/23 0305 02/01/23 1139  02/02/23 0433  WBC 6.5 6.8  --   --  6.5 6.9 6.8  HGB 9.7* 8.3* 8.2* 7.9* 7.7* 7.6* 8.2*  HCT 29.5* 26.7* 26.5* 24.2* 23.7* 24.7* 26.2*  MCV 87.0 89.9  --   --  87.8 91.1 89.7  PLT 289 327  --   --  277 322 310    Basic Metabolic Panel: Recent Labs  Lab 01/30/23 0351 02/02/23 0433  NA 131* 133*  K 3.5 3.5  CL 101 103  CO2 22 25  GLUCOSE 160* 168*  BUN 10 10  CREATININE 0.85 0.77  CALCIUM 7.9* 8.2*    GFR: Estimated Creatinine Clearance: 181.1 mL/min (by C-G formula based on SCr of 0.77 mg/dL).  Scheduled Meds:  sodium chloride   Intravenous Once   vitamin C  1,000 mg Oral Daily   Chlorhexidine Gluconate Cloth  6 each Topical Daily   docusate sodium  100 mg Oral BID   ezetimibe  10 mg Oral Daily   gabapentin  600 mg Oral BID   insulin aspart  0-15 Units Subcutaneous TID WC   insulin aspart  0-5 Units Subcutaneous QHS   insulin glargine-yfgn  30 Units Subcutaneous Daily   lisinopril  20 mg Oral Daily   metoprolol tartrate  25 mg Oral BID   multivitamin with minerals  1 tablet Oral Daily   nutrition supplement (JUVEN)  1 packet Oral BID BM   nystatin cream   Topical BID   pantoprazole  40 mg Oral Daily   sertraline  50 mg Oral Daily   sodium chloride flush  10-40 mL Intracatheter Q12H   warfarin  10 mg Oral ONCE-1600   Warfarin - Pharmacist Dosing Inpatient   Does not apply q1600   zinc sulfate  220 mg Oral Daily   Continuous Infusions:  DAPTOmycin (CUBICIN) 900 mg in sodium chloride 0.9 % IVPB 900 mg (02/01/23 1251)   heparin 3,350 Units/hr (02/02/23 0949)   magnesium sulfate bolus IVPB     methocarbamol (ROBAXIN) IV     Imaging and lab data was personally reviewed [place] EKG SITE RITE  Result Date: 01/31/2023 If Site Rite image not attached, placement could not be confirmed due to current cardiac rhythm.   LOS: 10 days   Rhetta Mura, MD,  FACP, [facility], [facility], [facility], [facility]   [facility] Physician Advisor [place]     To contact the  attending provider between 7A-7P or the covering provider during after hours 7P-7A, please log into the web site [email or web address] and access using universal  password for that web site. If you do not have the password, please call the hospital operator.

## 2023-02-02 NOTE — Progress Notes (Signed)
Patient ID: Adam Long, male   DOB: 1976-02-03, 47 y.o.   MRN: [id number] Patient is seen in follow-up status post revision transtibial amputation.  There is good suction of the wound VAC dressing there is no new drainage.  I will plan to have the wound VAC dressing removed and a dry dressing applied at the time of discharge.

## 2023-02-02 NOTE — Progress Notes (Signed)
Physical Therapy Treatment Patient Details Name: Adam Long MRN: [id number] DOB: 1975/09/30 Today's Date: 02/02/2023   History of Present Illness 47 y.o. male presents 01/24/23 with a chief complaint of left residual lower extremity pain and draining wound. +cellulitis and abscess. 5/01 I&D of L BKA. Pt returned to OR 5/4 for L BKA revision and wound vac placement. PMH includes: anxiety, aortic stenosis, CAD, depression, DM II, HTN, HLD, morbid obesity, AVR, amputations of L toes -> L BKA in 12/22    PT Comments    Pt was up in chair when PT arrived, has disconnected heparin IV and so did chair strengthening as PT and pt await nursing to come for reconnection of IV.  Pt is fairly comfortable, expecting to go home directly from hospital to have HHPT.  Will continue to work on strength, balance and transfers, with gait skills likely requiring two person help for safety on the first trial.  Follow along for all elements of therapy with goals of acute PT as outlined on POC.   Recommendations for follow up therapy are one component of a multi-disciplinary discharge planning process, led by the attending physician.  Recommendations may be updated based on patient status, additional functional criteria and insurance authorization.  Follow Up Recommendations       Assistance Recommended at Discharge PRN  Patient can return home with the following Assistance with cooking/housework;Assist for transportation;Help with stairs or ramp for entrance   Equipment Recommendations  Other (comment)    Recommendations for Other Services       Precautions / Restrictions Precautions Precautions: Fall;Other (comment) Precaution Comments: wound vac L residual limb Restrictions Weight Bearing Restrictions: Yes LLE Weight Bearing: Non weight bearing     Mobility  Bed Mobility               General bed mobility comments: up in chair when PT arrives    Transfers                    General transfer comment: deferred over IV heparin being disconnected when PT arrives    Ambulation/Gait               General Gait Details: deferred   Stairs             Wheelchair Mobility    Modified Rankin (Stroke Patients Only)       Balance Overall balance assessment: Needs assistance   Sitting balance-Leahy Scale: Good                                      Cognition Arousal/Alertness: Awake/alert Behavior During Therapy: WFL for tasks assessed/performed Overall Cognitive Status: Within Functional Limits for tasks assessed                                          Exercises General Exercises - Lower Extremity Ankle Circles/Pumps: AROM, 5 reps Quad Sets: AROM, 10 reps Gluteal Sets: AROM, 10 reps Long Arc Quad: AROM, Seated, Right, 10 reps Heel Slides: AAROM, 10 reps, Right Hip ABduction/ADduction: AROM, 10 reps Straight Leg Raises: AAROM, 10 reps    General Comments General comments (skin integrity, edema, etc.): Pt was assisted through some LE exercises, waiting for nursing to come in to restart Heparin IV that had become disconnected.  Will wait for further mobility until the IV has been discontinued      Pertinent Vitals/Pain Pain Assessment Pain Assessment: Faces Faces Pain Scale: Hurts a little bit Pain Location: L residual limb Pain Intervention(s): Limited activity within patient's tolerance, Premedicated before session    Home Living                          Prior Function            PT Goals (current goals can now be found in the care plan section)      Frequency    Min 4X/week      PT Plan Current plan remains appropriate    Co-evaluation              AM-PAC PT "6 Clicks" Mobility   Outcome Measure  Help needed turning from your back to your side while in a flat bed without using bedrails?: None Help needed moving from lying on your back to sitting on the side of a  flat bed without using bedrails?: None Help needed moving to and from a bed to a chair (including a wheelchair)?: A Little Help needed standing up from a chair using your arms (e.g., wheelchair or bedside chair)?: A Lot Help needed to walk in hospital room?: Total Help needed climbing 3-5 steps with a railing? : Total 6 Click Score: 15    End of Session Equipment Utilized During Treatment: Gait belt Activity Tolerance: Patient tolerated treatment well Patient left: with call bell/phone within reach;in bed;with bed alarm set Nurse Communication: Mobility status PT Visit Diagnosis: Difficulty in walking, not elsewhere classified (R26.2)     Time: 1610-9604 PT Time Calculation (min) (ACUTE ONLY): 16 min  Charges:  $Therapeutic Exercise: 8-22 mins               Ivar Drape 02/02/2023, 3:18 PM  Samul Dada, PT PhD Acute Rehab Dept. Number: [facility] [id number] and [facility] [phone]

## 2023-02-02 NOTE — Progress Notes (Signed)
ANTICOAGULATION CONSULT NOTE - Follow Up Consult  Pharmacy Consult for heparin bridge to warfarin Indication:  mechanical AVR  No Known Allergies  Patient Measurements: Height: 5\' 10"  (177.8 cm) Weight: (!) 168.1 kg (370 lb 9.5 oz) IBW/kg (Calculated) : 73 Heparin Dosing Weight: 112.8 kg  Vital Signs: Temp: 98.6 F (37 C) (05/08 1611) Temp Source: Oral (05/08 1611) BP: 132/87 (05/08 1611) Pulse Rate: 75 (05/08 1611)  Labs: Recent Labs    01/31/23 0437 01/31/23 1411 02/01/23 0305 02/01/23 1139 02/01/23 1200 02/02/23 0433 02/02/23 1550  HGB 9.7*   < > 7.7* 7.6*  --  8.2*  --   HCT 29.5*   < > 23.7* 24.7*  --  26.2*  --   PLT 289   < > 277 322  --  310  --   LABPROT 14.7  --  15.5*  --   --  15.2  --   INR 1.2  --  1.2  --   --  1.2  --   HEPARINUNFRC 0.54   < > 0.41 0.40  --  0.52 0.34  CREATININE  --   --   --   --   --  0.77  --   CKTOTAL  --   --   --   --  25*  --   --    < > = values in this interval not displayed.     Estimated Creatinine Clearance: 181.1 mL/min (by C-G formula based on SCr of 0.77 mg/dL).  Assessment: 47 yo M who presents with cellulitis and abscess of L BKA stump and initiated on broad-spectrum antibiotics. S/p surgical I&D on 5/1 with orthopedic surgery. Patient has a mechanical AVR and taking warfarin 10mg  T/R/Sa/Su and 5mg  on MWF PTA. This regimen was last reviewed in outpt anticoag clinic on 4/9 with therapeutic INR of 2.5. Pt reports last dose of warfarin was ~1 wk prior to admission. INR on admission was 1.3, subtherapeutic. Warfarin was held on admission in anticipation for possible surgery. Pharmacy consulted to dose heparin infusion for mechanical AVR. Patient is now s/p L amputation below knee revision on 5/4, cleared by Dr. Lajoyce Corners to resume heparin/warfarin bridge post-op. Significant bleeding post-op from L BKA stump revision on 5/6, and heparin held for 4 hrs with improvement. Heparin/warfarin then resumed. S/p 1u PRBC on 5/7.  5/8 AM  update: Heparin level slightly supratherapeutic this morning at 0.52 for lower goal 0.3-0.5 (due to post-op bleeding issues) and rate was reduced - level pending for 1400. Hg up to 8.2 s/p PRBC transfusion 5/7. Plt WNL. INR remains 1.2 stable after 4 days of home warfarin dose resumed. No bleeding or issues with infusion per discussion with RN.  Heparin level therapeutic this PM. Cont current rate.   Goal of Therapy:  Heparin level 0.3-0.5 units/mL INR goal 2-3 Monitor platelets by anticoagulation protocol: Yes   Plan:  Continue heparin at 3350 units/hr Warfarin 10mg  PO x 1 again today - consider increased dose 5/9 if INR doesn't start to increase and no further bleeding issues Monitor daily INR, CBC, s/sx bleeding - watch Hgb closely   [name], PharmD, BCIDP, AAHIVP, CPP Infectious Disease Pharmacist 02/02/2023 4:53 PM

## 2023-02-02 NOTE — Progress Notes (Signed)
ANTICOAGULATION CONSULT NOTE - Follow Up Consult  Pharmacy Consult for heparin bridge to warfarin Indication:  mechanical AVR  No Known Allergies  Patient Measurements: Height: 5\' 10"  (177.8 cm) Weight: (!) 168.1 kg (370 lb 9.5 oz) IBW/kg (Calculated) : 73 Heparin Dosing Weight: 112.8 kg  Vital Signs: Temp: 98.3 F (36.8 C) (05/08 0430) Temp Source: Oral (05/08 0430) BP: 116/67 (05/08 0430) Pulse Rate: 65 (05/08 0430)  Labs: Recent Labs    01/31/23 0437 01/31/23 1411 02/01/23 0305 02/01/23 1139 02/01/23 1200 02/02/23 0433  HGB 9.7*   < > 7.7* 7.6*  --  8.2*  HCT 29.5*   < > 23.7* 24.7*  --  26.2*  PLT 289   < > 277 322  --  310  LABPROT 14.7  --  15.5*  --   --  15.2  INR 1.2  --  1.2  --   --  1.2  HEPARINUNFRC 0.54   < > 0.41 0.40  --  0.52  CREATININE  --   --   --   --   --  0.77  CKTOTAL  --   --   --   --  25*  --    < > = values in this interval not displayed.     Estimated Creatinine Clearance: 181.1 mL/min (by C-G formula based on SCr of 0.77 mg/dL).  Assessment: 47 yo M who presents with cellulitis and abscess of L BKA stump and initiated on broad-spectrum antibiotics. S/p surgical I&D on 5/1 with orthopedic surgery. Patient has a mechanical AVR and taking warfarin 10mg  T/R/Sa/Su and 5mg  on MWF PTA. This regimen was last reviewed in outpt anticoag clinic on 4/9 with therapeutic INR of 2.5. Pt reports last dose of warfarin was ~1 wk prior to admission. INR on admission was 1.3, subtherapeutic. Warfarin was held on admission in anticipation for possible surgery. Pharmacy consulted to dose heparin infusion for mechanical AVR. Patient is now s/p L amputation below knee revision on 5/4, cleared by Dr. Lajoyce Corners to resume heparin/warfarin bridge post-op.   5/6 PM update: Patient heparin has been on hold for 4 hours. Bleeding has slowed. Hgb at 1800 is 8.2 (stable from previous hgb at 1411 at 8.3), but down from AM hgb at 9.7. D/W Dr. Waymon Amato, will restart heparin at  previous rate at 1830 and will recheck Hgb at 2200 and 0200. Will decrease therapeutic range to 0.3-0.5 for active bleeding. Heparin level at 0200 with CBC. Will continue with warfarin 5mg  today since not therapeutic.    5/8 AM update:  Heparin level just above goal Hgb up a little today s/p blood (7.6>8.2) INR 1.2  Goal of Therapy:  Heparin level 0.3-0.5 units/mL Monitor platelets by anticoagulation protocol: Yes   Plan:  Dec heparin to 3350 units/hr Heparin level at 1400 Watch Hgb closely  Abran Duke, PharmD, BCPS Clinical Pharmacist Phone: [phone]

## 2023-02-02 NOTE — Progress Notes (Signed)
ANTICOAGULATION CONSULT NOTE - Follow Up Consult  Pharmacy Consult for heparin bridge to warfarin Indication:  mechanical AVR  No Known Allergies  Patient Measurements: Height: 5\' 10"  (177.8 cm) Weight: (!) 168.1 kg (370 lb 9.5 oz) IBW/kg (Calculated) : 73 Heparin Dosing Weight: 112.8 kg  Vital Signs: Temp: 98 F (36.7 C) (05/08 0818) Temp Source: Oral (05/08 0818) BP: 135/95 (05/08 0818) Pulse Rate: 66 (05/08 0818)  Labs: Recent Labs    01/31/23 0437 01/31/23 1411 02/01/23 0305 02/01/23 1139 02/01/23 1200 02/02/23 0433  HGB 9.7*   < > 7.7* 7.6*  --  8.2*  HCT 29.5*   < > 23.7* 24.7*  --  26.2*  PLT 289   < > 277 322  --  310  LABPROT 14.7  --  15.5*  --   --  15.2  INR 1.2  --  1.2  --   --  1.2  HEPARINUNFRC 0.54   < > 0.41 0.40  --  0.52  CREATININE  --   --   --   --   --  0.77  CKTOTAL  --   --   --   --  25*  --    < > = values in this interval not displayed.     Estimated Creatinine Clearance: 181.1 mL/min (by C-G formula based on SCr of 0.77 mg/dL).  Assessment: 47 yo M who presents with cellulitis and abscess of L BKA stump and initiated on broad-spectrum antibiotics. S/p surgical I&D on 5/1 with orthopedic surgery. Patient has a mechanical AVR and taking warfarin 10mg  T/R/Sa/Su and 5mg  on MWF PTA. This regimen was last reviewed in outpt anticoag clinic on 4/9 with therapeutic INR of 2.5. Pt reports last dose of warfarin was ~1 wk prior to admission. INR on admission was 1.3, subtherapeutic. Warfarin was held on admission in anticipation for possible surgery. Pharmacy consulted to dose heparin infusion for mechanical AVR. Patient is now s/p L amputation below knee revision on 5/4, cleared by Dr. Lajoyce Corners to resume heparin/warfarin bridge post-op. Significant bleeding post-op from L BKA stump revision on 5/6, and heparin held for 4 hrs with improvement. Heparin/warfarin then resumed. S/p 1u PRBC on 5/7.  5/8 AM update: Heparin level slightly supratherapeutic this  morning at 0.52 for lower goal 0.3-0.5 (due to post-op bleeding issues) and rate was reduced - level pending for 1400. Hg up to 8.2 s/p PRBC transfusion 5/7. Plt WNL. INR remains 1.2 stable after 4 days of home warfarin dose resumed. No bleeding or issues with infusion per discussion with RN.  Goal of Therapy:  Heparin level 0.3-0.5 units/mL INR goal 2-3 Monitor platelets by anticoagulation protocol: Yes   Plan:  Continue heparin at 3350 units/hr Heparin level at 1400 Warfarin 10mg  PO x 1 again today - consider increased dose 5/9 if INR doesn't start to increase and no further bleeding issues Monitor daily INR, CBC, s/sx bleeding - watch Hgb closely   Leia Alf, PharmD, BCPS Please check AMION for all [facility] contact numbers Clinical Pharmacist 02/02/2023 10:04 AM

## 2023-02-02 NOTE — Progress Notes (Signed)
Occupational Therapy Treatment Patient Details Name: Adam Long MRN: [id number] DOB: Nov 18, 1975 Today's Date: 02/02/2023   History of present illness 47 y.o. male presents 01/24/23 with a chief complaint of left residual lower extremity pain and draining wound. +cellulitis and abscess. 5/01 I&D of L BKA. Pt returned to OR 5/4 for L BKA revision and wound vac placement. PMH includes: anxiety, aortic stenosis, CAD, depression, DM II, HTN, HLD, morbid obesity, AVR, amputations of L toes -> L BKA in 12/22   OT comments  Patient making good progress with supervision for bed mobility and transfer to recliner. Patient able to stand for peri area cleaning with mod assist to complete. Patient provided with sock aide and instructions on use. Patient able to return demonstration with supervision and verbal cues for use. Acute OT to continue to follow for LB dressing and toilet transfers.    Recommendations for follow up therapy are one component of a multi-disciplinary discharge planning process, led by the attending physician.  Recommendations may be updated based on patient status, additional functional criteria and insurance authorization.    Assistance Recommended at Discharge Set up Supervision/Assistance  Patient can return home with the following  A little help with walking and/or transfers;A little help with bathing/dressing/bathroom;Assistance with cooking/housework;Assist for transportation;Help with stairs or ramp for entrance   Equipment Recommendations  None recommended by OT (standard walker needs to be replaced with bariatric RW, case manager contacted)    Recommendations for Other Services      Precautions / Restrictions Precautions Precautions: Fall;Other (comment) Precaution Comments: wound vac L residual limb Restrictions Weight Bearing Restrictions: Yes LLE Weight Bearing: Non weight bearing       Mobility Bed Mobility Overal bed mobility: Needs Assistance Bed Mobility:  Sit to Supine       Sit to supine: Supervision   General bed mobility comments: increased time and use of bed rail.    Transfers Overall transfer level: Needs assistance Equipment used: Rolling walker (2 wheels) Transfers: Sit to/from Stand Sit to Stand: Supervision     Step pivot transfers: Supervision     General transfer comment: used bariatric RW and supervision for safety     Balance Overall balance assessment: Needs assistance Sitting-balance support: No upper extremity supported, Feet supported Sitting balance-Leahy Scale: Good     Standing balance support: Bilateral upper extremity supported, Reliant on assistive device for balance, During functional activity Standing balance-Leahy Scale: Fair Standing balance comment: reliant on RW for support                           ADL either performed or assessed with clinical judgement   ADL Overall ADL's : Needs assistance/impaired             Lower Body Bathing: Moderate assistance;Sit to/from stand Lower Body Bathing Details (indicate cue type and reason): to clean bottom after getting OOB Upper Body Dressing : Set up Upper Body Dressing Details (indicate cue type and reason): changed gown Lower Body Dressing: Supervision/safety;Sitting/lateral leans Lower Body Dressing Details (indicate cue type and reason): education on sock aide use with supervision to return demonstration Toilet Transfer: Supervision/safety;Rolling walker (2 wheels) Toilet Transfer Details (indicate cue type and reason): simulated to recliner           General ADL Comments: Patient stood from EOB and bed pad was soiled assisted patient with completing    Extremity/Trunk Assessment  Vision       Perception     Praxis      Cognition Arousal/Alertness: Awake/alert Behavior During Therapy: WFL for tasks assessed/performed Overall Cognitive Status: Within Functional Limits for tasks assessed                                           Exercises      Shoulder Instructions       General Comments      Pertinent Vitals/ Pain       Pain Assessment Pain Assessment: Faces Faces Pain Scale: Hurts a little bit Pain Location: L residual limb Pain Descriptors / Indicators: Discomfort Pain Intervention(s): Limited activity within patient's tolerance, Monitored during session, Repositioned  Home Living                                          Prior Functioning/Environment              Frequency  Min 2X/week        Progress Toward Goals  OT Goals(current goals can now be found in the care plan section)  Progress towards OT goals: Progressing toward goals  Acute Rehab OT Goals Patient Stated Goal: go home OT Goal Formulation: With patient Time For Goal Achievement: 02/15/23 Potential to Achieve Goals: Good ADL Goals Pt Will Perform Lower Body Dressing: with adaptive equipment;with set-up Pt Will Transfer to Toilet: with modified independence;ambulating Pt Will Perform Toileting - Clothing Manipulation and hygiene: with modified independence  Plan Discharge plan remains appropriate    Co-evaluation                 AM-PAC OT "6 Clicks" Daily Activity     Outcome Measure   Help from another person eating meals?: None Help from another person taking care of personal grooming?: A Little Help from another person toileting, which includes using toliet, bedpan, or urinal?: A Little Help from another person bathing (including washing, rinsing, drying)?: A Little Help from another person to put on and taking off regular upper body clothing?: A Little Help from another person to put on and taking off regular lower body clothing?: A Little 6 Click Score: 19    End of Session Equipment Utilized During Treatment: Gait belt;Rolling walker (2 wheels)  OT Visit Diagnosis: Unsteadiness on feet (R26.81);Other abnormalities of gait and  mobility (R26.89);Pain Pain - Right/Left: Left Pain - part of body: Leg   Activity Tolerance Patient tolerated treatment well   Patient Left in chair;with call bell/phone within reach;with chair alarm set   Nurse Communication Mobility status        Time: 1610-9604 OT Time Calculation (min): 26 min  Charges: OT General Charges $OT Visit: 1 Visit OT Treatments $Self Care/Home Management : 23-37 mins  Alfonse Flavors, OTA Acute Rehabilitation Services  Office [phone]   Dewain Penning 02/02/2023, 10:44 AM

## 2023-02-03 ENCOUNTER — Other Ambulatory Visit (HOSPITAL_COMMUNITY): Payer: Self-pay

## 2023-02-03 DIAGNOSIS — L02416 Cutaneous abscess of left lower limb: Secondary | ICD-10-CM | POA: Diagnosis not present

## 2023-02-03 DIAGNOSIS — L03116 Cellulitis of left lower limb: Secondary | ICD-10-CM | POA: Diagnosis not present

## 2023-02-03 LAB — CBC
HCT: 26.5 % — ABNORMAL LOW (ref 39.0–52.0)
Hemoglobin: 8.3 g/dL — ABNORMAL LOW (ref 13.0–17.0)
MCH: 27.9 pg (ref 26.0–34.0)
MCHC: 31.3 g/dL (ref 30.0–36.0)
MCV: 89.2 fL (ref 80.0–100.0)
Platelets: 319 10*3/uL (ref 150–400)
RBC: 2.97 MIL/uL — ABNORMAL LOW (ref 4.22–5.81)
RDW: 14.1 % (ref 11.5–15.5)
WBC: 7.4 10*3/uL (ref 4.0–10.5)
nRBC: 0.4 % — ABNORMAL HIGH (ref 0.0–0.2)

## 2023-02-03 LAB — PROTIME-INR
INR: 1.3 — ABNORMAL HIGH (ref 0.8–1.2)
Prothrombin Time: 16.5 seconds — ABNORMAL HIGH (ref 11.4–15.2)

## 2023-02-03 LAB — HEPARIN LEVEL (UNFRACTIONATED): Heparin Unfractionated: 0.45 IU/mL (ref 0.30–0.70)

## 2023-02-03 LAB — GLUCOSE, CAPILLARY
Glucose-Capillary: 141 mg/dL — ABNORMAL HIGH (ref 70–99)
Glucose-Capillary: 154 mg/dL — ABNORMAL HIGH (ref 70–99)

## 2023-02-03 LAB — HEPARIN ANTI-XA: Heparin LMW: 0.53 IU/mL

## 2023-02-03 MED ORDER — INSULIN GLARGINE 100 UNIT/ML SOLOSTAR PEN
30.0000 [IU] | PEN_INJECTOR | Freq: Every day | SUBCUTANEOUS | 11 refills | Status: DC
  Filled 2023-02-03 – 2023-03-20 (×2): qty 15, 50d supply, fill #0
  Filled 2023-05-20: qty 15, 50d supply, fill #1
  Filled 2023-07-11: qty 15, 50d supply, fill #2

## 2023-02-03 MED ORDER — ENOXAPARIN SODIUM 300 MG/3ML IJ SOLN
160.0000 mg | Freq: Two times a day (BID) | INTRAMUSCULAR | 0 refills | Status: DC
  Filled 2023-02-03: qty 20, fill #0

## 2023-02-03 MED ORDER — METHOCARBAMOL 500 MG PO TABS
500.0000 mg | ORAL_TABLET | Freq: Four times a day (QID) | ORAL | 0 refills | Status: DC | PRN
  Filled 2023-02-03: qty 30, 8d supply, fill #0

## 2023-02-03 MED ORDER — ENOXAPARIN SODIUM 100 MG/ML IJ SOSY
100.0000 mg | PREFILLED_SYRINGE | Freq: Two times a day (BID) | INTRAMUSCULAR | 1 refills | Status: DC
  Filled 2023-02-03: qty 20, 10d supply, fill #0

## 2023-02-03 MED ORDER — ENOXAPARIN SODIUM 300 MG/3ML IJ SOLN
160.0000 mg | Freq: Two times a day (BID) | INTRAMUSCULAR | Status: DC
  Administered 2023-02-03: 160 mg via SUBCUTANEOUS
  Filled 2023-02-03 (×2): qty 1.6

## 2023-02-03 MED ORDER — OXYCODONE HCL 10 MG PO TABS
10.0000 mg | ORAL_TABLET | ORAL | 0 refills | Status: DC | PRN
  Filled 2023-02-03: qty 30, 4d supply, fill #0

## 2023-02-03 MED ORDER — WARFARIN SODIUM 6 MG PO TABS
12.0000 mg | ORAL_TABLET | Freq: Once | ORAL | Status: AC
  Administered 2023-02-03: 12 mg via ORAL
  Filled 2023-02-03: qty 2

## 2023-02-03 MED ORDER — ZINC SULFATE 220 (50 ZN) MG PO TABS
220.0000 mg | ORAL_TABLET | Freq: Every day | ORAL | 0 refills | Status: DC
  Filled 2023-02-03: qty 100, 100d supply, fill #0

## 2023-02-03 MED ORDER — WARFARIN SODIUM 10 MG PO TABS: 10.0000 mg | ORAL_TABLET | Freq: Once | ORAL | Status: DC

## 2023-02-03 MED ORDER — OXYCODONE HCL 5 MG PO TABS
5.0000 mg | ORAL_TABLET | ORAL | 0 refills | Status: DC | PRN
  Filled 2023-02-03: qty 30, 3d supply, fill #0

## 2023-02-03 MED ORDER — INSULIN PEN NEEDLE 32G X 4 MM MISC
0 refills | Status: DC
  Filled 2023-02-03: qty 100, 100d supply, fill #0

## 2023-02-03 MED ORDER — ENOXAPARIN SODIUM 60 MG/0.6ML IJ SOSY
60.0000 mg | PREFILLED_SYRINGE | Freq: Two times a day (BID) | INTRAMUSCULAR | 1 refills | Status: DC
  Filled 2023-02-03: qty 12, 10d supply, fill #0

## 2023-02-03 MED ORDER — ENOXAPARIN SODIUM 300 MG/3ML IJ SOLN
160.0000 mg | Freq: Two times a day (BID) | INTRAMUSCULAR | Status: DC
  Filled 2023-02-03: qty 1.6

## 2023-02-03 NOTE — Progress Notes (Addendum)
ANTICOAGULATION CONSULT NOTE - Follow Up Consult  Pharmacy Consult for Lovenox bridge to warfarin Indication:  mechanical AVR  No Known Allergies  Patient Measurements: Height: 5\' 10"  (177.8 cm) Weight: (!) 168.1 kg (370 lb 9.5 oz) IBW/kg (Calculated) : 73 Heparin Dosing Weight: 112.8 kg  Vital Signs: Temp: 98 F (36.7 C) (05/09 0510) Temp Source: Oral (05/09 0510) BP: 129/74 (05/09 0510) Pulse Rate: 69 (05/09 0510)  Labs: Recent Labs    02/01/23 0305 02/01/23 1139 02/01/23 1200 02/02/23 0433 02/02/23 1550 02/03/23 0521  HGB 7.7* 7.6*  --  8.2*  --  8.3*  HCT 23.7* 24.7*  --  26.2*  --  26.5*  PLT 277 322  --  310  --  319  LABPROT 15.5*  --   --  15.2  --  16.5*  INR 1.2  --   --  1.2  --  1.3*  HEPARINUNFRC 0.41 0.40  --  0.52 0.34 0.45  CREATININE  --   --   --  0.77  --   --   CKTOTAL  --   --  25*  --   --   --     Estimated Creatinine Clearance: 181.1 mL/min (by C-G formula based on SCr of 0.77 mg/dL).  Assessment: 47 yo M who presents with cellulitis and abscess of L BKA stump and initiated on broad-spectrum antibiotics. S/p surgical I&D on 5/1 with orthopedic surgery. Patient has a mechanical AVR and taking warfarin 10mg  T/R/Sa/Su and 5mg  on MWF PTA. This regimen was last reviewed in outpt anticoag clinic on 4/9 with therapeutic INR of 2.5. Pt reports last dose of warfarin was ~1 wk prior to admission. INR on admission was 1.3, subtherapeutic. Warfarin was held on admission in anticipation for possible surgery. Pharmacy consulted to dose heparin infusion for mechanical AVR. Patient is now s/p L amputation below knee revision on 5/4, cleared by Dr. Lajoyce Corners to resume heparin/warfarin bridge post-op. Significant bleeding post-op from L BKA stump revision on 5/6, and heparin held for 4 hrs with improvement. Heparin/warfarin then resumed. S/p 1u PRBC on 5/7.   Today (5/9), patient desires to go home and pharmacy consulted to transition IV Heparin to Lovenox therapy for  home use.  CBC is low-stable. Last pRBC given . Platelets are within normal limits and stable. No further bleeding  Heparin level was therapeutic this AM. Will discontinue Heparin and start Lovenox therapy. Will check a low molecular weight heparin level after first dose to ensure not overshooting the dose.   Goal of Therapy:  Heparin level 0.3-0.5 units/mL INR goal 2-3 Monitor platelets by anticoagulation protocol: Yes   Plan:  Stop IV Heparin.  Start Lovenox 160 mg SQ every 12 hours per protocol at time of discontinuation of Heparin (updated protocol recommendation).  Will check anti-Xa level in 4 hours after dose given.  Warfarin 12 mg PO x 1 today then proceed back to home dose starting tomorrow. Monitor daily INR, CBC, s/sx bleeding - watch Hgb closely   Addendum: 4hr level = 0.53 (not steady state level) - falls slightly below appropriate range of 0.6 to 1 units/mL but this likely will accumulate to goal. Ok to discharge home on this dose.    Link Snuffer, PharmD, BCPS, BCCCP Clinical Pharmacist Please refer to [facility] for [facility] Pharmacy numbers 02/03/2023 9:48 AM

## 2023-02-03 NOTE — Plan of Care (Signed)
Problem: Education: Goal: Ability to describe self-care measures that may prevent or decrease complications (Diabetes Survival Skills Education) will improve 02/03/2023 1111 by Letta Moynahan, RN Outcome: Adequate for Discharge 02/03/2023 1111 by Letta Moynahan, RN Outcome: Adequate for Discharge Goal: Individualized Educational Video(s) 02/03/2023 1111 by Letta Moynahan, RN Outcome: Adequate for Discharge 02/03/2023 1111 by Letta Moynahan, RN Outcome: Adequate for Discharge   Problem: Coping: Goal: Ability to adjust to condition or change in health will improve 02/03/2023 1111 by Letta Moynahan, RN Outcome: Adequate for Discharge 02/03/2023 1111 by Letta Moynahan, RN Outcome: Adequate for Discharge   Problem: Fluid Volume: Goal: Ability to maintain a balanced intake and output will improve 02/03/2023 1111 by Letta Moynahan, RN Outcome: Adequate for Discharge 02/03/2023 1111 by Letta Moynahan, RN Outcome: Adequate for Discharge   Problem: Health Behavior/Discharge Planning: Goal: Ability to identify and utilize available resources and services will improve 02/03/2023 1111 by Letta Moynahan, RN Outcome: Adequate for Discharge 02/03/2023 1111 by Letta Moynahan, RN Outcome: Adequate for Discharge Goal: Ability to manage health-related needs will improve 02/03/2023 1111 by Letta Moynahan, RN Outcome: Adequate for Discharge 02/03/2023 1111 by Letta Moynahan, RN Outcome: Adequate for Discharge   Problem: Metabolic: Goal: Ability to maintain appropriate glucose levels will improve 02/03/2023 1111 by Letta Moynahan, RN Outcome: Adequate for Discharge 02/03/2023 1111 by Letta Moynahan, RN Outcome: Adequate for Discharge   Problem: Nutritional: Goal: Maintenance of adequate nutrition will improve 02/03/2023 1111 by Letta Moynahan, RN Outcome: Adequate for Discharge 02/03/2023 1111 by Letta Moynahan, RN Outcome: Adequate for Discharge Goal: Progress toward achieving an optimal weight will improve 02/03/2023  1111 by Letta Moynahan, RN Outcome: Adequate for Discharge 02/03/2023 1111 by Letta Moynahan, RN Outcome: Adequate for Discharge   Problem: Skin Integrity: Goal: Risk for impaired skin integrity will decrease 02/03/2023 1111 by Letta Moynahan, RN Outcome: Adequate for Discharge 02/03/2023 1111 by Letta Moynahan, RN Outcome: Adequate for Discharge   Problem: Tissue Perfusion: Goal: Adequacy of tissue perfusion will improve 02/03/2023 1111 by Letta Moynahan, RN Outcome: Adequate for Discharge 02/03/2023 1111 by Letta Moynahan, RN Outcome: Adequate for Discharge   Problem: Education: Goal: Knowledge of General Education information will improve Description: Including pain rating scale, medication(s)/side effects and non-pharmacologic comfort measures 02/03/2023 1111 by Letta Moynahan, RN Outcome: Adequate for Discharge 02/03/2023 1111 by Letta Moynahan, RN Outcome: Adequate for Discharge   Problem: Health Behavior/Discharge Planning: Goal: Ability to manage health-related needs will improve 02/03/2023 1111 by Letta Moynahan, RN Outcome: Adequate for Discharge 02/03/2023 1111 by Letta Moynahan, RN Outcome: Adequate for Discharge   Problem: Clinical Measurements: Goal: Ability to maintain clinical measurements within normal limits will improve 02/03/2023 1111 by Letta Moynahan, RN Outcome: Adequate for Discharge 02/03/2023 1111 by Letta Moynahan, RN Outcome: Adequate for Discharge Goal: Will remain free from infection 02/03/2023 1111 by Letta Moynahan, RN Outcome: Adequate for Discharge 02/03/2023 1111 by Letta Moynahan, RN Outcome: Adequate for Discharge Goal: Diagnostic test results will improve 02/03/2023 1111 by Letta Moynahan, RN Outcome: Adequate for Discharge 02/03/2023 1111 by Letta Moynahan, RN Outcome: Adequate for Discharge Goal: Respiratory complications will improve 02/03/2023 1111 by Letta Moynahan, RN Outcome: Adequate for Discharge 02/03/2023 1111 by Letta Moynahan, RN Outcome:  Adequate for Discharge Goal: Cardiovascular complication will be avoided 02/03/2023 1111 by Letta Moynahan, RN Outcome:  Adequate for Discharge 02/03/2023 1111 by Letta Moynahan, RN Outcome: Adequate for Discharge   Problem: Activity: Goal: Risk for activity intolerance will decrease 02/03/2023 1111 by Letta Moynahan, RN Outcome: Adequate for Discharge 02/03/2023 1111 by Letta Moynahan, RN Outcome: Adequate for Discharge   Problem: Nutrition: Goal: Adequate nutrition will be maintained 02/03/2023 1111 by Letta Moynahan, RN Outcome: Adequate for Discharge 02/03/2023 1111 by Letta Moynahan, RN Outcome: Adequate for Discharge   Problem: Coping: Goal: Level of anxiety will decrease 02/03/2023 1111 by Letta Moynahan, RN Outcome: Adequate for Discharge 02/03/2023 1111 by Letta Moynahan, RN Outcome: Adequate for Discharge   Problem: Elimination: Goal: Will not experience complications related to bowel motility 02/03/2023 1111 by Letta Moynahan, RN Outcome: Adequate for Discharge 02/03/2023 1111 by Letta Moynahan, RN Outcome: Adequate for Discharge Goal: Will not experience complications related to urinary retention 02/03/2023 1111 by Letta Moynahan, RN Outcome: Adequate for Discharge 02/03/2023 1111 by Letta Moynahan, RN Outcome: Adequate for Discharge   Problem: Pain Managment: Goal: General experience of comfort will improve 02/03/2023 1111 by Letta Moynahan, RN Outcome: Adequate for Discharge 02/03/2023 1111 by Letta Moynahan, RN Outcome: Adequate for Discharge   Problem: Safety: Goal: Ability to remain free from injury will improve 02/03/2023 1111 by Letta Moynahan, RN Outcome: Adequate for Discharge 02/03/2023 1111 by Letta Moynahan, RN Outcome: Adequate for Discharge   Problem: Skin Integrity: Goal: Risk for impaired skin integrity will decrease 02/03/2023 1111 by Letta Moynahan, RN Outcome: Adequate for Discharge 02/03/2023 1111 by Letta Moynahan, RN Outcome: Adequate for Discharge   Problem:  Education: Goal: Knowledge of the prescribed therapeutic regimen will improve 02/03/2023 1111 by Letta Moynahan, RN Outcome: Adequate for Discharge 02/03/2023 1111 by Letta Moynahan, RN Outcome: Adequate for Discharge Goal: Ability to verbalize activity precautions or restrictions will improve 02/03/2023 1111 by Letta Moynahan, RN Outcome: Adequate for Discharge 02/03/2023 1111 by Letta Moynahan, RN Outcome: Adequate for Discharge Goal: Understanding of discharge needs will improve 02/03/2023 1111 by Letta Moynahan, RN Outcome: Adequate for Discharge 02/03/2023 1111 by Letta Moynahan, RN Outcome: Adequate for Discharge   Problem: Activity: Goal: Ability to perform//tolerate increased activity and mobilize with assistive devices will improve 02/03/2023 1111 by Letta Moynahan, RN Outcome: Adequate for Discharge 02/03/2023 1111 by Letta Moynahan, RN Outcome: Adequate for Discharge   Problem: Clinical Measurements: Goal: Postoperative complications will be avoided or minimized 02/03/2023 1111 by Letta Moynahan, RN Outcome: Adequate for Discharge 02/03/2023 1111 by Letta Moynahan, RN Outcome: Adequate for Discharge   Problem: Self-Care: Goal: Ability to meet self-care needs will improve 02/03/2023 1111 by Letta Moynahan, RN Outcome: Adequate for Discharge 02/03/2023 1111 by Letta Moynahan, RN Outcome: Adequate for Discharge   Problem: Self-Concept: Goal: Ability to maintain and perform role responsibilities to the fullest extent possible will improve 02/03/2023 1111 by Letta Moynahan, RN Outcome: Adequate for Discharge 02/03/2023 1111 by Letta Moynahan, RN Outcome: Adequate for Discharge   Problem: Pain Management: Goal: Pain level will decrease with appropriate interventions 02/03/2023 1111 by Letta Moynahan, RN Outcome: Adequate for Discharge 02/03/2023 1111 by Letta Moynahan, RN Outcome: Adequate for Discharge   Problem: Skin Integrity: Goal: Demonstration of wound healing without infection will  improve 02/03/2023 1111 by Letta Moynahan, RN Outcome: Adequate for Discharge 02/03/2023 1111 by Letta Moynahan, RN Outcome: Adequate for Discharge

## 2023-02-03 NOTE — TOC Progression Note (Addendum)
Transition of Care (TOC) - Progression Note   Patient discharging home today. Will need to receive IV ABX at hospital prior to discharge. Nurse aware.   Patient will need INR check in  3 to 4 days per MD. Adam Long with [facility] await call back.   Lovenox script to be sent to [facility]    [place] with [facility] iupdated on discharge for today. He will need todays dose given today , know how to give Lovenox , and MD to put in  discharge summary when INR needs to be drawn.  HHRN will see him tomorrow at home .   Patient aware of above. He has given himself Lovenox in the past.   Secure chatted team   Will fax discharge summary when completed.  [id number] faxed DC summary to [facility] and called [place]  Patient Details  Name: Adam Long MRN: [id number] Date of Birth: [birth year]  [facility] CM/SW Contact  Adam Long, Adria Devon, RN Phone Number: 02/03/2023, 10:07 AM  Clinical Narrative:       Expected Discharge Plan: Home/Self Care Barriers to Discharge: Continued Medical Work up  Expected Discharge Plan and Services   Discharge Planning Services: CM Consult Post Acute Care Choice: Durable Medical Equipment Living arrangements for the past 2 months: Single Family Home                 DME Arranged: Walker rolling ((bariatric RW)) DME Agency: [employer] Date DME Agency Contacted: 01/27/23 Time DME Agency Contacted: [phone] Representative spoke with at DME Agency: Lelon Mast HH Arranged: NA           Social Determinants of Health (SDOH) Interventions SDOH Screenings   Food Insecurity: No Food Insecurity (01/24/2023)  Housing: Low Risk  (01/24/2023)  Transportation Needs: No Transportation Needs (01/24/2023)  Utilities: Not At Risk (01/24/2023)  Depression (PHQ2-9): Medium Risk (10/12/2022)  Tobacco Use: Medium Risk (01/31/2023)    Readmission Risk Interventions     No data to display

## 2023-02-03 NOTE — Progress Notes (Signed)
Patient ID: Adam Long, male   DOB: [birth year], 47 y.o.   MRN: [id number] No new drainage in the wound VAC canister.  I placed orders today for the [facility] dressing to be removed today apply dry dressing I will follow-up in the office in 1 week.  Patient does not have to change the dry dressing.

## 2023-02-03 NOTE — Discharge Instructions (Signed)
Information on my medicine - Coumadin   (Warfarin)  This medication education was reviewed with me or my healthcare representative as part of my discharge preparation.  The pharmacist that spoke with me during my hospital stay was:  Fayne Norrie, [facility]  Why was Coumadin prescribed for you? Coumadin was prescribed for you because you have a blood clot or a medical condition that can cause an increased risk of forming blood clots. Blood clots can cause serious health problems by blocking the flow of blood to the heart, lung, or brain. Coumadin can prevent harmful blood clots from forming. As a reminder your indication for Coumadin is:  Blood Clot Prevention after Heart Valve Surgery  What test will check on my response to Coumadin? While on Coumadin (warfarin) you will need to have an INR test regularly to ensure that your dose is keeping you in the desired range. The INR (international normalized ratio) number is calculated from the result of the laboratory test called prothrombin time (PT).  If an INR APPOINTMENT HAS NOT ALREADY BEEN MADE FOR YOU please schedule an appointment to have this lab work done by your health care provider within 7 days. Your INR goal is usually a number between:  2 to 3 or your provider may give you a more narrow range like 2-2.5.  Ask your health care provider during an office visit what your goal INR is.  What  do you need to  know  About  COUMADIN? Take Coumadin (warfarin) exactly as prescribed by your healthcare provider about the same time each day.  DO NOT stop taking without talking to the doctor who prescribed the medication.  Stopping without other blood clot prevention medication to take the place of Coumadin may increase your risk of developing a new clot or stroke.  Get refills before you run out.  What do you do if you miss a dose? If you miss a dose, take it as soon as you remember on the same day then continue your regularly scheduled regimen the  next day.  Do not take two doses of Coumadin at the same time.  Important Safety Information A possible side effect of Coumadin (Warfarin) is an increased risk of bleeding. You should call your healthcare provider right away if you experience any of the following: Bleeding from an injury or your nose that does not stop. Unusual colored urine (red or dark brown) or unusual colored stools (red or black). Unusual bruising for unknown reasons. A serious fall or if you hit your head (even if there is no bleeding).  Some foods or medicines interact with Coumadin (warfarin) and might alter your response to warfarin. To help avoid this: Eat a balanced diet, maintaining a consistent amount of Vitamin K. Notify your provider about major diet changes you plan to make. Avoid alcohol or limit your intake to 1 drink for women and 2 drinks for men per day. (1 drink is 5 oz. wine, 12 oz. beer, or 1.5 oz. liquor.)  Make sure that ANY health care provider who prescribes medication for you knows that you are taking Coumadin (warfarin).  Also make sure the healthcare provider who is monitoring your Coumadin knows when you have started a new medication including herbals and non-prescription products.  Coumadin (Warfarin)  Major Drug Interactions  Increased Warfarin Effect Decreased Warfarin Effect  Alcohol (large quantities) Antibiotics (esp. Septra/Bactrim, Flagyl, Cipro) Amiodarone (Cordarone) Aspirin (ASA) Cimetidine (Tagamet) Megestrol (Megace) NSAIDs (ibuprofen, naproxen, etc.) Piroxicam (Feldene) Propafenone (Rythmol SR)  Propranolol (Inderal) Isoniazid (INH) Posaconazole (Noxafil) Barbiturates (Phenobarbital) Carbamazepine (Tegretol) Chlordiazepoxide (Librium) Cholestyramine (Questran) Griseofulvin Oral Contraceptives Rifampin Sucralfate (Carafate) Vitamin K   Coumadin (Warfarin) Major Herbal Interactions  Increased Warfarin Effect Decreased Warfarin Effect  Garlic Ginseng Ginkgo  biloba Coenzyme Q10 Green tea St. John's wort    Coumadin (Warfarin) FOOD Interactions  Eat a consistent number of servings per week of foods HIGH in Vitamin K (1 serving =  cup)  Collards (cooked, or boiled & drained) Kale (cooked, or boiled & drained) Mustard greens (cooked, or boiled & drained) Parsley *serving size only =  cup Spinach (cooked, or boiled & drained) Swiss chard (cooked, or boiled & drained) Turnip greens (cooked, or boiled & drained)  Eat a consistent number of servings per week of foods MEDIUM-HIGH in Vitamin K (1 serving = 1 cup)  Asparagus (cooked, or boiled & drained) Broccoli (cooked, boiled & drained, or raw & chopped) Brussel sprouts (cooked, or boiled & drained) *serving size only =  cup Lettuce, raw (green leaf, endive, romaine) Spinach, raw Turnip greens, raw & chopped   These websites have more information on Coumadin (warfarin):  http://www.king-russell.com/; https://www.hines.net/;  Information on my medicine - LOVENOX (enoxaparin)  This medication education was reviewed with me or my healthcare representative as part of my discharge preparation.  The pharmacist that spoke with me during my hospital stay was: Fayne Norrie, [facility]   Why was lovenox prescribed for you? Lovenox was prescribed to treat blood clots that may have been found in the veins of your legs (deep vein thrombosis) or in your lungs (pulmonary embolism) and to reduce the risk of them occurring again.  What do You need to know about Lovenox? Lovenox  is given by a shot under the skin by you or a care provider. Lovenox is injected into the right or left side of your abdomen at least 2 inches away from your belly button. Clean the injection site with an alcohol swab and let dry. Give only the amount prescribed to you by your healthcare provider and do not change the dose unless instructed by your health care provider.  Try to administer the dose(s) about the same time  every day. To minimize bruising and irritation, rotate where the shots are given.  Store Lovenox at room temperature, away from heat and direct light. Do not refrigerate or freeze the syringes. Keep away from children or pets.  Take Lovenox exactly as prescribed and DO NOT stop taking Lovenox without talking to the doctor who prescribed the medication.  Stopping may increase your risk of developing a new blood clot.    After discharge, you should have regular appointments with your healthcare provider.  Dispose of used syringe and cap in a sharps disposal container once used (if sharps disposal container unavailable, use any hard plastic jug that can be securely closed).     What do you do if you miss a dose? If a dose of Lovenox is not administered at the scheduled time, administer it as soon as possible on the same day and then resume regular administration schedule. The dose should not be doubled to make up for a missed dose.  Important Safety Information A possible side effect of Lovenox is bleeding. You should call your healthcare provider right away if you experience any of the following: Bleeding from an injury or your nose that does not stop. Unusual colored urine (red or dark brown) or unusual colored stools (red or black). Unusual bruising for unknown  reasons. A serious fall or if you hit your head (even if there is no bleeding).  Some medicines may interact with Lovenox and might increase your risk of bleeding or clotting while on Lovenox. To help avoid this, consult your healthcare provider or pharmacist prior to using any new prescription or non-prescription medications, including herbals, vitamins, non-steroidal anti-inflammatory drugs (NSAIDs) and supplements.  This website has more information on Lovenox (enoxaparin): www.http://rich.org/.

## 2023-02-03 NOTE — Discharge Summary (Addendum)
Physician Discharge Summary  Adam Long ZOX:[id number] DOB: [birth year] DOA: 01/23/2023  PCP: [name], DO  Admit date: 01/23/2023 Discharge date: 02/03/2023  Time spent: 50 minutes  Recommendations for Outpatient Follow-up:  Needs INR check in about 3 to 4 days "INR draw on May 13 or 14 , 2024 " per [facility] protoocl and report to Dr. Diona Browner Recommend vitamin K controlled diet given on Coumadin Recommend CBC in about 1 week Pain meds called in for patient to pharmacy  Discharge Diagnoses:  MAIN problem for hospitalization   MRSA BKA stump infection with bleeding Acute anemia blood loss during hospital stay Mechanical AVR repair Uncontrolled diabetes mellitus A1c 13 this admission Super morbid obesity BMI >50  Please see below for itemized issues addressed in HOpsital- refer to other progress notes for clarity if needed  Discharge Condition: Improved  Diet recommendation: Diabetic  Filed Weights   01/31/23 0421 02/01/23 0521 02/02/23 0500  Weight: (!) 171.2 kg (!) 168.2 kg (!) 168.1 kg    History of present illness:  47 year old male with type 2 diabetes mellitus, uncontrolled, nonobstructive CAD on 08/2019 cath, hypertension, AVR 2021 Dr. Lavinia Sharps, dyslipidemia, left BKA with revision x 2, morbid obesity noted admitted 4/29 left-sided prosthesis along with bleeding.  He stated he stopped wearing his prosthesis about 5 days before.   H/o MSSA bacteremia in the setting of a left diabetic foot ulcer in 2023 which was treated with a BKA and IV antibiotics.   Also E. coli, Proteus mirabilis and E faecalis and a separate infection with-a, E faecalis, Clostridium perfringens and multidrug-resistant Klebsiella pneumonia growing from the wound.    Evaluation revealed CT scan showed findings suggestive of abscesses without osteomyelitis.  S/p irrigation and excisional debridement with wound VAC application 5/1.  S/p left BKA revision by Dr. Lajoyce Corners 5/4.  Postop resumed IV heparin bridging  and restarted warfarin per pharmacy.  Hospitalization complicated on 5/6 and 5/7 with significant amount of bleeding at site of revision hemoglobin dropping 2 g and patient was transfused 1 unit PRBC 5/7     Assessment and Plan: Principal Problem: Left BKA MRSA stump abscess -Multiple infections in the past requiring revisions - S/p irrigation and excisional debridement with wound VAC application 5/1 - S/p left BKA revision by Dr. Lajoyce Corners 5/4.  -Culture confirms MRSA. -ID consultation 5/6 appreciated, discontinued vancomycin-->daptomycin EOT = 03/25/2023 follow-up with Dr. Thedore Mins 5/21-[facility] in the outpatient setting and labs as per their protocol including CRP ESR etc. etc. -Pain meds sent to the outpatient pharmacy     Postop Acute Blood Loss Anemia Left BKA stump bleeding seems to have subsided-1 unit PRBC 5/7-hemoglobin stabilized and he was not having a whole lot of bleeding subsequently as per review by Dr. Lajoyce Corners on day of discharge 5/9 Patient was on heparin initially as per below   Mechanical AVR repair 2021 requiring INR between 2.5-3.5 Heparin was transitioned cautiously to Lovenox twice daily as patient is familiar with its use-hemoglobin seem to have stabilized and testing was done by pharmacy This was verified and patient was able to demonstrate use of Lovenox on himself Patient will go home on his usual regimen of Coumadin with a bridge with Lovenox in the outpatient setting and will need an INR checked in about 3 to 4 days and decision made either with Dr. Ival Bible office or with PCP with regards to discontinuation of Lovenox versus not  Uncontrolled type 2 diabetes mellitus with hyperglycemia (HCC) poorly controlled A1c 13.4 on metformin  Continue Semglee 30 units daily moderate SSI CBG = 1 28-1 70 Patient is not insulin nave but has not used it in a while-I called in insulin pens to the pharmacy He is on metformin twice daily and I have encouraged him to be compliant on a  low-fat diet as this would help with compliance on the metformin (patient was having diarrhea while on this) continuous glucometer to be reinforced with   HTN (hypertension) -Controlled on metoprolol 25 twice daily and lisinopril 20 daily   CAD (coronary artery disease) No anginal symptoms.  Continues on Zetia on discharge   Hyperlipidemia associated with type 2 diabetes mellitus (HCC) Continue Zetia.     S/P aortic valve replacement with mechanical valve Outpatient follow-up with [facility] on discharge   Mild hyponatremia - appears chronically low but is improving steadily     Anxiety - Zoloft 50 continued during hospital stay     GERD (gastroesophageal reflux disease) -Protonix   Hypokalemia - Replaced.    Severe hypoalbuminemia - Albumin level 1.9.  Dietitian consulted.   Body mass index is 53.17 kg/m./Very morbid obesity Complicates care including wound healing from surgery, DM control etc.   Discharge Exam: Vitals:   02/02/23 2033 02/03/23 0510  BP: 116/63 129/74  Pulse: 78 69  Resp: 18 20  Temp: 98.3 F (36.8 C) 98 F (36.7 C)  SpO2: 100% 99%    Subj on day of d/c   Awake coherent pleasant no distress comfortable appearing no significant pain at this time Passing stool  General Exam on discharge  EOMI NCAT no focal deficit no icterus no pallor no rales no rhonchi no wheeze Stump appears well opposed with VAC which will be changed to wet-to-dry dressings Abdomen obese nontender nondistended Midline chest scar Chest clear Some seborrheic keratoses on his back  Discharge Instructions   Discharge Instructions     [facility] pharmacist to adjust dose for Vancomycin, Aminoglycosides and other anti-infective therapies as requested by physician.   Complete by: As directed    [facility] to provide Cath Flo 2mg    Complete by: As directed    Administer for PICC line occlusion and as ordered by physician for other access device  issues.   Anaphylaxis Kit: Provided to treat any anaphylactic reaction to the medication being provided to the patient if First Dose or when requested by physician   Complete by: As directed    Epinephrine 1mg /ml vial / amp: Administer 0.3mg  (0.72ml) subcutaneously once for moderate to severe anaphylaxis, nurse to call physician and pharmacy when reaction occurs and call 911 if needed for immediate care   Diphenhydramine 50mg /ml IV vial: Administer 25-50mg  IV/IM PRN for first dose reaction, rash, itching, mild reaction, nurse to call physician and pharmacy when reaction occurs   Sodium Chloride 0.9% NS IV: Administer if needed for hypovolemic blood pressure drop or as ordered by physician after call to physician with anaphylactic reaction   Change dressing on IV access line weekly and PRN   Complete by: As directed    Diet - low sodium heart healthy   Complete by: As directed    Discharge wound care:   Complete by: As directed    Per orthopedic surgery   Flush IV access with Sodium Chloride 0.9% and Heparin 10 units/ml or 100 units/ml   Complete by: As directed    Home infusion instructions - [facility]   Complete by: As directed    Instructions: Flush IV access  with Sodium Chloride 0.9% and Heparin 10units/ml or 100units/ml   Change dressing on IV access line: Weekly and PRN   Instructions Cath Flo 2mg : Administer for PICC Line occlusion and as ordered by physician for other access device   [facility] pharmacist to adjust dose for: Vancomycin, Aminoglycosides and other anti-infective therapies as requested by physician   Increase activity slowly   Complete by: As directed    Method of administration may be changed at the discretion of home infusion pharmacist based upon assessment of the patient and/or caregiver's ability to self-administer the medication ordered   Complete by: As directed    Negative Pressure Wound Therapy - Incisional   Complete by: As  directed    Attached patient's wound VAC dressing to the Praveena plus portable wound VAC pump at discharge.      Allergies as of 02/03/2023   No Known Allergies      Medication List     TAKE these medications    daptomycin  IVPB Commonly known as: CUBICIN Inject 900 mg into the vein daily. Indication:  BKA site infection  First Dose: Yes Last Day of Therapy:  03/25/23  Labs - Once weekly:  CBC/D, BMP, and CPK Labs - Every other week:  ESR and CRP Method of administration: IV Push Method of administration may be changed at the discretion of home infusion pharmacist based upon assessment of the patient and/or caregiver's ability to self-administer the medication ordered.   enoxaparin 100 MG/ML injection Commonly known as: LOVENOX Inject 1 mL (100 mg total) into the skin 2 (two) times daily for 10 days.   enoxaparin 60 MG/0.6ML injection Commonly known as: LOVENOX Inject 1.675 mLs (167.5 mg total) into the skin 2 (two) times daily for 10 days.   ezetimibe 10 MG tablet Commonly known as: Zetia Take 1 tablet (10 mg total) by mouth daily.   gabapentin 300 MG capsule Commonly known as: NEURONTIN Take 2 capsules (600 mg total) by mouth 2 (two) times daily.   insulin glargine 100 UNIT/ML Solostar Pen Commonly known as: LANTUS Inject 30 Units into the skin daily. Start taking on: Feb 04, 2023   lisinopril 20 MG tablet Commonly known as: ZESTRIL Take 1 tablet (20 mg total) by mouth daily.   metFORMIN 500 MG tablet Commonly known as: GLUCOPHAGE Take 2 tablets (1,000 mg total) by mouth 2 (two) times daily with a meal.   methocarbamol 500 MG tablet Commonly known as: ROBAXIN Take 1 tablet (500 mg total) by mouth every 6 (six) hours as needed for muscle spasms.   metoprolol tartrate 25 MG tablet Commonly known as: LOPRESSOR Take 1 tablet (25 mg total) by mouth 2 (two) times daily.   oxyCODONE 5 MG immediate release tablet Commonly known as: Oxy IR/ROXICODONE Take 1-2  tablets (5-10 mg total) by mouth every 4 (four) hours as needed for moderate pain (pain score 4-6).   Oxycodone HCl 10 MG Tabs Take 1-1.5 tablets (10-15 mg total) by mouth every 4 (four) hours as needed for severe pain (pain score 7-10).   pantoprazole 40 MG tablet Commonly known as: PROTONIX Take 1 tablet (40 mg total) by mouth daily.   sertraline 50 MG tablet Commonly known as: ZOLOFT Take 1 tablet (50 mg total) by mouth daily.   warfarin 10 MG tablet Commonly known as: COUMADIN Take as directed. If you are unsure how to take this medication, talk to your nurse or doctor. Original instructions: Take 1 tablet daily except 1/2 tablet  on Mondays, Wednesdays and Fridays or as directed   Zinc Sulfate 220 (50 Zn) MG Tabs Take 1 tablet (220 mg total) by mouth daily. Start taking on: Feb 04, 2023               Durable Medical Equipment  (From admission, onward)           Start     Ordered   02/01/23 0847  DME Bedside commode  Once       Comments: bariatric  Question:  Patient needs a bedside commode to treat with the following condition  Answer:  History of open reduction and internal fixation (ORIF) procedure   02/01/23 0847   01/27/23 1533  DME Walker rolling  Once       Comments: (bariatric RW)  Question:  Patient needs a walker to treat with the following condition  Answer:  History of open reduction and internal fixation (ORIF) procedure   01/27/23 1532   01/26/23 1613  DME 3 n 1  Once        01/26/23 1612              Discharge Care Instructions  (From admission, onward)           Start     Ordered   02/03/23 0000  Discharge wound care:       Comments: Per orthopedic surgery   02/03/23 1327   02/01/23 0000  Change dressing on IV access line weekly and PRN  (Home infusion instructions - [facility] )        02/01/23 1258           No Known Allergies  Follow-up Information     Nadara Mustard, MD Follow up in 1 week(s).    Specialty: Orthopedic Surgery Contact information: [address] [place] [address] [phone]                  The results of significant diagnostics from this hospitalization (including imaging, microbiology, ancillary and laboratory) are listed below for reference.    Significant Diagnostic Studies: [place] EKG SITE RITE  Result Date: 01/31/2023 If Site Rite image not attached, placement could not be confirmed due to current cardiac rhythm.  CT TIBIA FIBULA LEFT W CONTRAST  Result Date: 01/23/2023 CLINICAL DATA:  Soft tissue mass, lower leg, deep. Patient reports a wound caused by his prosthetic limb with bleeding for several days. History of diabetes and osteomyelitis. EXAM: CT OF THE LOWER LEFT EXTREMITY WITH CONTRAST TECHNIQUE: Multidetector CT imaging of the left knee was performed according to the standard protocol following intravenous contrast administration. RADIATION DOSE REDUCTION: This exam was performed according to the departmental dose-optimization program which includes automated exposure control, adjustment of the mA and/or kV according to patient size and/or use of iterative reconstruction technique. CONTRAST:  OMNIPAQUE IOHEXOL 300 MG/ML  SOLN COMPARISON:  No recent comparison imaging. Correlation is made with preoperative radiographs of the left foot 08/27/2021 and MRI of the left foot 02/12/2021. FINDINGS: Bones/Joint/Cartilage Patient is status post below the knee amputation. Along the amputation margins of the proximal tibial and fibular diaphyses, there is cortical thickening and periosteal bone formation. No gross cortical destruction to suggest recurrent osteomyelitis. There is no evidence of acute fracture or dislocation. Tricompartmental degenerative changes are present at the knee without significant joint effusion. Ligaments Suboptimally assessed by CT. Muscles and Tendons Generalized muscular atrophy. The visualized quadriceps and patellar tendons  are intact. No focal intramuscular  fluid collections are identified. Soft tissues There is an open wound along the lateral aspect of the distal stump with medial extension of ill-defined collections of fluid and gas along the distal aspect of the stump, suspicious for soft tissue abscesses. Largest component measures up to 4.1 x 2.6 cm on image 110/5. There is surrounding subcutaneous edema and overlying dermal thickening. No focal fluid collections are seen more proximally. There is generalized subcutaneous edema within the lower leg. Prominent vascular calcifications are noted. IMPRESSION: 1. Open wound along the lateral aspect of the distal stump with medial extension of ill-defined collections of fluid and gas along the distal aspect of the stump, suspicious for soft tissue abscesses. Largest component measures up to 4.1 x 2.6 cm. 2. No gross cortical destruction to suggest recurrent osteomyelitis. There is cortical thickening and periosteal bone formation along the amputation margins of the proximal tibial and fibular diaphyses. 3. Generalized subcutaneous edema within the lower leg. 4. Left knee degenerative changes without significant joint effusion. Electronically Signed   By: Carey Bullocks M.D.   On: 01/23/2023 16:40    Microbiology: Recent Results (from the past 240 hour(s))  Aerobic/Anaerobic Culture w Gram Stain (surgical/deep wound)     Status: None   Collection Time: 01/26/23  2:56 PM   Specimen: PATH Soft tissue  Result Value Ref Range Status   Specimen Description WOUND  Final   Special Requests LEFT STUMP  Final   Gram Stain   Final    RARE WBC PRESENT, PREDOMINANTLY PMN FEW GRAM POSITIVE COCCI IN PAIRS FEW GRAM POSITIVE COCCI IN CLUSTERS    Culture   Final    ABUNDANT METHICILLIN RESISTANT STAPHYLOCOCCUS AUREUS NO ANAEROBES ISOLATED Performed at [facility], [address] [place], [place] [address]    Report Status 01/31/2023 FINAL  Final   Organism ID, Bacteria  METHICILLIN RESISTANT STAPHYLOCOCCUS AUREUS  Final      Susceptibility   Methicillin resistant staphylococcus aureus - MIC*    CIPROFLOXACIN <=0.5 SENSITIVE Sensitive     ERYTHROMYCIN >=8 RESISTANT Resistant     GENTAMICIN <=0.5 SENSITIVE Sensitive     OXACILLIN >=4 RESISTANT Resistant     TETRACYCLINE >=16 RESISTANT Resistant     VANCOMYCIN 1 SENSITIVE Sensitive     TRIMETH/SULFA <=10 SENSITIVE Sensitive     CLINDAMYCIN <=0.25 SENSITIVE Sensitive     RIFAMPIN <=0.5 SENSITIVE Sensitive     Inducible Clindamycin NEGATIVE Sensitive     LINEZOLID 2 SENSITIVE Sensitive     * ABUNDANT METHICILLIN RESISTANT STAPHYLOCOCCUS AUREUS     Labs: Basic Metabolic Panel: Recent Labs  Lab 01/30/23 0351 02/02/23 0433  NA 131* 133*  K 3.5 3.5  CL 101 103  CO2 22 25  GLUCOSE 160* 168*  BUN 10 10  CREATININE 0.85 0.77  CALCIUM 7.9* 8.2*   Liver Function Tests: No results for input(s): "AST", "ALT", "ALKPHOS", "BILITOT", "PROT", "ALBUMIN" in the last 168 hours. No results for input(s): "LIPASE", "AMYLASE" in the last 168 hours. No results for input(s): "AMMONIA" in the last 168 hours. CBC: Recent Labs  Lab 01/31/23 1411 01/31/23 1747 01/31/23 2157 02/01/23 0305 02/01/23 1139 02/02/23 0433 02/03/23 0521  WBC 6.8  --   --  6.5 6.9 6.8 7.4  HGB 8.3*   < > 7.9* 7.7* 7.6* 8.2* 8.3*  HCT 26.7*   < > 24.2* 23.7* 24.7* 26.2* 26.5*  MCV 89.9  --   --  87.8 91.1 89.7 89.2  PLT 327  --   --  277 322 310 319   < > = values in this interval not displayed.   Cardiac Enzymes: Recent Labs  Lab 02/01/23 1200  CKTOTAL 25*   BNP: BNP (last 3 results) No results for input(s): "BNP" in the last 8760 hours.  ProBNP (last 3 results) No results for input(s): "PROBNP" in the last 8760 hours.  CBG: Recent Labs  Lab 02/02/23 1210 02/02/23 1611 02/02/23 2036 02/03/23 0745 02/03/23 1158  GLUCAP 171* 128* 129* 141* 154*       Signed:  Rhetta Mura MD   [facility] 02/03/2023, 1:28 PM

## 2023-02-03 NOTE — Plan of Care (Deleted)
  Problem: Education: Goal: Ability to describe self-care measures that may prevent or decrease complications (Diabetes Survival Skills Education) will improve Outcome: Adequate for Discharge Goal: Individualized Educational Video(s) Outcome: Adequate for Discharge   Problem: Coping: Goal: Ability to adjust to condition or change in health will improve Outcome: Adequate for Discharge   Problem: Fluid Volume: Goal: Ability to maintain a balanced intake and output will improve Outcome: Adequate for Discharge   Problem: Health Behavior/Discharge Planning: Goal: Ability to identify and utilize available resources and services will improve Outcome: Adequate for Discharge Goal: Ability to manage health-related needs will improve Outcome: Adequate for Discharge   Problem: Metabolic: Goal: Ability to maintain appropriate glucose levels will improve Outcome: Adequate for Discharge   Problem: Nutritional: Goal: Maintenance of adequate nutrition will improve Outcome: Adequate for Discharge Goal: Progress toward achieving an optimal weight will improve Outcome: Adequate for Discharge   Problem: Skin Integrity: Goal: Risk for impaired skin integrity will decrease Outcome: Adequate for Discharge   Problem: Tissue Perfusion: Goal: Adequacy of tissue perfusion will improve Outcome: Adequate for Discharge   Problem: Education: Goal: Knowledge of General Education information will improve Description: Including pain rating scale, medication(s)/side effects and non-pharmacologic comfort measures Outcome: Adequate for Discharge   Problem: Health Behavior/Discharge Planning: Goal: Ability to manage health-related needs will improve Outcome: Adequate for Discharge   Problem: Clinical Measurements: Goal: Ability to maintain clinical measurements within normal limits will improve Outcome: Adequate for Discharge Goal: Will remain free from infection Outcome: Adequate for Discharge Goal:  Diagnostic test results will improve Outcome: Adequate for Discharge Goal: Respiratory complications will improve Outcome: Adequate for Discharge Goal: Cardiovascular complication will be avoided Outcome: Adequate for Discharge   Problem: Activity: Goal: Risk for activity intolerance will decrease Outcome: Adequate for Discharge   Problem: Nutrition: Goal: Adequate nutrition will be maintained Outcome: Adequate for Discharge   Problem: Coping: Goal: Level of anxiety will decrease Outcome: Adequate for Discharge   Problem: Elimination: Goal: Will not experience complications related to bowel motility Outcome: Adequate for Discharge Goal: Will not experience complications related to urinary retention Outcome: Adequate for Discharge   Problem: Pain Managment: Goal: General experience of comfort will improve Outcome: Adequate for Discharge   Problem: Safety: Goal: Ability to remain free from injury will improve Outcome: Adequate for Discharge   Problem: Skin Integrity: Goal: Risk for impaired skin integrity will decrease Outcome: Adequate for Discharge   Problem: Education: Goal: Knowledge of the prescribed therapeutic regimen will improve Outcome: Adequate for Discharge Goal: Ability to verbalize activity precautions or restrictions will improve Outcome: Adequate for Discharge Goal: Understanding of discharge needs will improve Outcome: Adequate for Discharge   Problem: Activity: Goal: Ability to perform//tolerate increased activity and mobilize with assistive devices will improve Outcome: Adequate for Discharge   Problem: Clinical Measurements: Goal: Postoperative complications will be avoided or minimized Outcome: Adequate for Discharge   Problem: Self-Care: Goal: Ability to meet self-care needs will improve Outcome: Adequate for Discharge   Problem: Self-Concept: Goal: Ability to maintain and perform role responsibilities to the fullest extent possible will  improve Outcome: Adequate for Discharge   Problem: Pain Management: Goal: Pain level will decrease with appropriate interventions Outcome: Adequate for Discharge   Problem: Skin Integrity: Goal: Demonstration of wound healing without infection will improve Outcome: Adequate for Discharge   

## 2023-02-03 NOTE — Progress Notes (Signed)
Physical Therapy Treatment Patient Details Name: Adam Long MRN: [id number] DOB: [birth year] Today's Date: 02/03/2023   History of Present Illness 47 y.o. male presents 01/24/23 with a chief complaint of left residual lower extremity pain and draining wound. +cellulitis and abscess. 5/01 I&D of L BKA. Pt returned to OR 5/4 for L BKA revision and wound vac placement. PMH includes: anxiety, aortic stenosis, CAD, depression, DM II, HTN, HLD, morbid obesity, AVR, amputations of L toes -> L BKA in 12/22    PT Comments    Pt was seen for final PT visit with expectation of discharge home today.  Adjusted walker for him, walked and reviewed exercises for use until HHPT follows up.  Pt is motivated and very aware of safety with walking on RW due to having his LLE revised and having experience moving as an amputee.  Very much ready for progression to more gait and will be a good candidate for refitting prosthesis when healed.  Follow up as his admission permits.   Recommendations for follow up therapy are one component of a multi-disciplinary discharge planning process, led by the attending physician.  Recommendations may be updated based on patient status, additional functional criteria and insurance authorization.  Follow Up Recommendations       Assistance Recommended at Discharge PRN  Patient can return home with the following Assistance with cooking/housework;Assist for transportation;Help with stairs or ramp for entrance   Equipment Recommendations  Other (comment)    Recommendations for Other Services       Precautions / Restrictions Precautions Precautions: Fall;Other (comment) Precaution Comments: wound vac L residual limb Restrictions Weight Bearing Restrictions: Yes LLE Weight Bearing: Non weight bearing     Mobility  Bed Mobility               General bed mobility comments: up in chair when PT arrives    Transfers Overall transfer level: Needs assistance Equipment  used: Rolling walker (2 wheels) Transfers: Sit to/from Stand Sit to Stand: Supervision           General transfer comment: pt is up to move with min guard for safety but is really supervision level of help even to sit back down    Ambulation/Gait Ambulation/Gait assistance: Min guard Gait Distance (Feet): 16 Feet (8 x 2) Assistive device: Rolling walker (2 wheels)   Gait velocity: decreased Gait velocity interpretation: <1.31 ft/sec, indicative of household ambulator Pre-gait activities: standing balance ck General Gait Details: pt is up to move on RLE with good control of new walker provided for home, adjusted for pt   Stairs             Wheelchair Mobility    Modified Rankin (Stroke Patients Only)       Balance Overall balance assessment: Needs assistance Sitting-balance support: Feet supported Sitting balance-Leahy Scale: Good     Standing balance support: Bilateral upper extremity supported, Reliant on assistive device for balance, During functional activity Standing balance-Leahy Scale: Poor Standing balance comment: fair on RW                            Cognition Arousal/Alertness: Awake/alert Behavior During Therapy: WFL for tasks assessed/performed Overall Cognitive Status: Within Functional Limits for tasks assessed  Exercises General Exercises - Lower Extremity Ankle Circles/Pumps: AROM, 5 reps Quad Sets: AROM, 10 reps Gluteal Sets: AROM, 10 reps Hip ABduction/ADduction: AROM, 10 reps Mini-Sqauts: AROM, 10 reps    General Comments General comments (skin integrity, edema, etc.): Pt is assisted to stand up and walk on RW, demonstrating awareness of safety and balance with RW.  Good safety skills      Pertinent Vitals/Pain Pain Assessment Pain Assessment: Faces Faces Pain Scale: Hurts a little bit Pain Location: L residual limb Pain Descriptors / Indicators:  Guarding Pain Intervention(s): Monitored during session, Repositioned, Premedicated before session    Home Living                          Prior Function            PT Goals (current goals can now be found in the care plan section) Acute Rehab PT Goals Patient Stated Goal: home Progress towards PT goals: Progressing toward goals    Frequency    Min 4X/week      PT Plan Current plan remains appropriate    Co-evaluation              AM-PAC PT "6 Clicks" Mobility   Outcome Measure  Help needed turning from your back to your side while in a flat bed without using bedrails?: None Help needed moving from lying on your back to sitting on the side of a flat bed without using bedrails?: None Help needed moving to and from a bed to a chair (including a wheelchair)?: A Little Help needed standing up from a chair using your arms (e.g., wheelchair or bedside chair)?: A Little Help needed to walk in hospital room?: A Little Help needed climbing 3-5 steps with a railing? : Total 6 Click Score: 18    End of Session Equipment Utilized During Treatment: Gait belt Activity Tolerance: Patient tolerated treatment well Patient left: with call bell/phone within reach;in bed;with bed alarm set Nurse Communication: Mobility status PT Visit Diagnosis: Difficulty in walking, not elsewhere classified (R26.2)     Time: 1610-9604 PT Time Calculation (min) (ACUTE ONLY): 25 min  Charges:  $Gait Training: 8-22 mins $Therapeutic Exercise: 8-22 mins          Ivar Drape 02/03/2023, 4:44 PM  Samul Dada, PT PhD Acute Rehab Dept. Number: [facility] [id number] and [facility] [phone]

## 2023-02-04 ENCOUNTER — Telehealth: Payer: Self-pay

## 2023-02-04 NOTE — Transitions of Care (Post Inpatient/ED Visit) (Signed)
02/04/2023  Name: Adam Long MRN: [id number] DOB: 21-Apr-1976  Today's TOC FU Call Status: Today's TOC FU Call Status:: Successful TOC FU Call Competed TOC FU Call Complete Date: 02/04/23  Transition Care Management Follow-up Telephone Call Date of Discharge: 02/03/23 Discharge Facility: [facility]) Type of Discharge: Inpatient Admission Primary Inpatient Discharge Diagnosis:: wound infection How have you been since you were released from the hospital?: Better Any questions or concerns?: No  Items Reviewed: Did you receive and understand the discharge instructions provided?: Yes Medications obtained,verified, and reconciled?: Yes (Medications Reviewed) Any new allergies since your discharge?: No Dietary orders reviewed?: Yes Do you have support at home?: Yes People in Home: other relative(s)  Medications Reviewed Today: Medications Reviewed Today     Reviewed by Karena Addison, LPN (Licensed Practical Nurse) on 02/04/23 at 1026  Med List Status: <None>   Medication Order Taking? Sig Documenting Provider Last Dose Status Informant  daptomycin (CUBICIN) IVPB [id number] Yes Inject 900 mg into the vein daily. Indication:  BKA site infection  First Dose: Yes Last Day of Therapy:  03/25/23  Labs - Once weekly:  CBC/D, BMP, and CPK Labs - Every other week:  ESR and CRP Method of administration: IV Push Method of administration may be changed at the discretion of home infusion pharmacist based upon assessment of the patient and/or caregiver's ability to self-administer the medication ordered. Elease Etienne, MD Taking Active   enoxaparin (LOVENOX) 100 MG/ML injection [id number] Yes Inject 1 mL (100 mg total) into the skin 2 (two) times daily for 10 days. *Use with the 60mg /0.71mL syringe for a total of 160mg /doseRhetta Mura, MD Taking Active   enoxaparin (LOVENOX) 60 MG/0.6ML injection [id number] Yes Inject 0.6 mLs (60 mg total) into the skin 2 (two) times daily for  10 days. *Use with the 100mg /mL syringe for a total of 160mg /doseRhetta Mura, MD Taking Active   ezetimibe (ZETIA) 10 MG tablet [id number] Yes Take 1 tablet (10 mg total) by mouth daily. [name], DO Taking Active Self, Pharmacy Records  gabapentin (NEURONTIN) 300 MG capsule [id number] Yes Take 2 capsules (600 mg total) by mouth 2 (two) times daily. [name], DO Taking Active Self, Pharmacy Records  insulin glargine (LANTUS) 100 UNIT/ML Solostar Pen [id number] Yes Inject 30 Units into the skin daily. Rhetta Mura, MD Taking Active   Insulin Pen Needle 32G X 4 MM MISC [id number] Yes Use with Lantus flexpen Rhetta Mura, MD Taking Active   lisinopril (ZESTRIL) 20 MG tablet [id number] Yes Take 1 tablet (20 mg total) by mouth daily. [name], DO Taking Active Self, Pharmacy Records  metFORMIN (GLUCOPHAGE) 500 MG tablet [id number] Yes Take 2 tablets (1,000 mg total) by mouth 2 (two) times daily with a meal. [name], DO Taking Active Self, Pharmacy Records  methocarbamol (ROBAXIN) 500 MG tablet [id number] Yes Take 1 tablet (500 mg total) by mouth every 6 (six) hours as needed for muscle spasms. Rhetta Mura, MD Taking Active   metoprolol tartrate (LOPRESSOR) 25 MG tablet [id number] Yes Take 1 tablet (25 mg total) by mouth 2 (two) times daily. [name], DO Taking Active Self, Pharmacy Records  oxyCODONE (OXY IR/ROXICODONE) 5 MG immediate release tablet [id number] Yes Take 1-2 tablets (5-10 mg total) by mouth every 4 (four) hours as needed for moderate pain (pain score 4-6). Rhetta Mura, MD Taking Active   Oxycodone HCl 10 MG TABS [id number] Yes Take 1-1.5 tablets (10-15 mg total) by mouth every 4 (  four) hours as needed for severe pain (pain score 7-10). Rhetta Mura, MD Taking Active   pantoprazole (PROTONIX) 40 MG tablet [id number] Yes Take 1 tablet (40 mg total) by mouth daily. [name], DO Taking Active Self, Pharmacy Records  sertraline  (ZOLOFT) 50 MG tablet [id number] Yes Take 1 tablet (50 mg total) by mouth daily. [name], DO Taking Active Self, Pharmacy Records  warfarin (COUMADIN) 10 MG tablet [id number] Yes Take 1 tablet daily except 1/2 tablet on Mondays, Wednesdays and Fridays or as directed Jonelle Sidle, MD Taking Active Self, Pharmacy Records  Zinc Sulfate 220 (50 Zn) MG TABS [id number] Yes Take 1 tablet (220 mg total) by mouth daily. Rhetta Mura, MD Taking Active             Home Care and Equipment/Supplies: Were Home Health Services Ordered?: Yes Name of Home Health Agency:: unknown Has Agency set up a time to come to your home?: Yes First Home Health Visit Date: 02/04/23 Any new equipment or medical supplies ordered?: Yes Name of Medical supply agency?: unknown Were you able to get the equipment/medical supplies?: Yes Do you have any questions related to the use of the equipment/supplies?: No  Functional Questionnaire: Do you need assistance with bathing/showering or dressing?: No Do you need assistance with meal preparation?: No Do you need assistance with eating?: No Do you have difficulty maintaining continence: No Do you need assistance with getting out of bed/getting out of a chair/moving?: No Do you have difficulty managing or taking your medications?: No  Follow up appointments reviewed: PCP Follow-up appointment confirmed?: Yes Date of PCP follow-up appointment?: 02/16/23 Follow-up Provider: [facility] Follow-up appointment confirmed?: No Reason Specialist Follow-Up Not Confirmed: Patient has Specialist Provider Number and will Call for Appointment Do you need transportation to your follow-up appointment?: No Do you understand care options if your condition(s) worsen?: Yes-patient verbalized understanding    SIGNATURE Karena Addison, LPN [facility] Nurse Health Advisor Direct Dial [phone]

## 2023-02-06 ENCOUNTER — Other Ambulatory Visit: Payer: Self-pay

## 2023-02-06 ENCOUNTER — Emergency Department (HOSPITAL_COMMUNITY): Payer: Medicaid Other

## 2023-02-06 ENCOUNTER — Inpatient Hospital Stay (HOSPITAL_COMMUNITY)
Admission: EM | Admit: 2023-02-06 | Discharge: 2023-02-17 | DRG: 565 | Disposition: A | Discharge status: Home / Self Care | Payer: Medicaid Other | Attending: Internal Medicine | Admitting: Internal Medicine

## 2023-02-06 ENCOUNTER — Encounter (HOSPITAL_COMMUNITY): Payer: Self-pay | Admitting: Emergency Medicine

## 2023-02-06 DIAGNOSIS — Y835 Amputation of limb(s) as the cause of abnormal reaction of the patient, or of later complication, without mention of misadventure at the time of the procedure: Secondary | ICD-10-CM | POA: Diagnosis present

## 2023-02-06 DIAGNOSIS — L03116 Cellulitis of left lower limb: Secondary | ICD-10-CM | POA: Diagnosis present

## 2023-02-06 DIAGNOSIS — Y92000 Kitchen of unspecified non-institutional (private) residence as  the place of occurrence of the external cause: Secondary | ICD-10-CM | POA: Diagnosis not present

## 2023-02-06 DIAGNOSIS — T45515A Adverse effect of anticoagulants, initial encounter: Secondary | ICD-10-CM | POA: Diagnosis present

## 2023-02-06 DIAGNOSIS — T8789 Other complications of amputation stump: Secondary | ICD-10-CM | POA: Diagnosis present

## 2023-02-06 DIAGNOSIS — I2510 Atherosclerotic heart disease of native coronary artery without angina pectoris: Secondary | ICD-10-CM | POA: Diagnosis present

## 2023-02-06 DIAGNOSIS — E1169 Type 2 diabetes mellitus with other specified complication: Secondary | ICD-10-CM | POA: Diagnosis present

## 2023-02-06 DIAGNOSIS — E8721 Acute metabolic acidosis: Secondary | ICD-10-CM | POA: Diagnosis present

## 2023-02-06 DIAGNOSIS — Z89512 Acquired absence of left leg below knee: Secondary | ICD-10-CM | POA: Diagnosis not present

## 2023-02-06 DIAGNOSIS — F32A Depression, unspecified: Secondary | ICD-10-CM | POA: Diagnosis present

## 2023-02-06 DIAGNOSIS — Z803 Family history of malignant neoplasm of breast: Secondary | ICD-10-CM

## 2023-02-06 DIAGNOSIS — Z800 Family history of malignant neoplasm of digestive organs: Secondary | ICD-10-CM

## 2023-02-06 DIAGNOSIS — F419 Anxiety disorder, unspecified: Secondary | ICD-10-CM | POA: Diagnosis present

## 2023-02-06 DIAGNOSIS — D649 Anemia, unspecified: Principal | ICD-10-CM

## 2023-02-06 DIAGNOSIS — W228XXA Striking against or struck by other objects, initial encounter: Secondary | ICD-10-CM | POA: Diagnosis present

## 2023-02-06 DIAGNOSIS — L02416 Cutaneous abscess of left lower limb: Secondary | ICD-10-CM | POA: Diagnosis present

## 2023-02-06 DIAGNOSIS — E782 Mixed hyperlipidemia: Secondary | ICD-10-CM | POA: Diagnosis present

## 2023-02-06 DIAGNOSIS — N179 Acute kidney failure, unspecified: Secondary | ICD-10-CM | POA: Insufficient documentation

## 2023-02-06 DIAGNOSIS — E1165 Type 2 diabetes mellitus with hyperglycemia: Secondary | ICD-10-CM | POA: Diagnosis present

## 2023-02-06 DIAGNOSIS — D62 Acute posthemorrhagic anemia: Secondary | ICD-10-CM | POA: Diagnosis not present

## 2023-02-06 DIAGNOSIS — F39 Unspecified mood [affective] disorder: Secondary | ICD-10-CM | POA: Diagnosis present

## 2023-02-06 DIAGNOSIS — Z952 Presence of prosthetic heart valve: Secondary | ICD-10-CM | POA: Diagnosis not present

## 2023-02-06 DIAGNOSIS — D6832 Hemorrhagic disorder due to extrinsic circulating anticoagulants: Secondary | ICD-10-CM | POA: Diagnosis present

## 2023-02-06 DIAGNOSIS — I10 Essential (primary) hypertension: Secondary | ICD-10-CM | POA: Diagnosis not present

## 2023-02-06 DIAGNOSIS — Z7984 Long term (current) use of oral hypoglycemic drugs: Secondary | ICD-10-CM

## 2023-02-06 DIAGNOSIS — Z8614 Personal history of Methicillin resistant Staphylococcus aureus infection: Secondary | ICD-10-CM

## 2023-02-06 DIAGNOSIS — Z6841 Body Mass Index (BMI) 40.0 and over, adult: Secondary | ICD-10-CM

## 2023-02-06 DIAGNOSIS — A419 Sepsis, unspecified organism: Secondary | ICD-10-CM

## 2023-02-06 DIAGNOSIS — Z8249 Family history of ischemic heart disease and other diseases of the circulatory system: Secondary | ICD-10-CM

## 2023-02-06 DIAGNOSIS — T8744 Infection of amputation stump, left lower extremity: Secondary | ICD-10-CM | POA: Diagnosis present

## 2023-02-06 DIAGNOSIS — Z87891 Personal history of nicotine dependence: Secondary | ICD-10-CM | POA: Diagnosis not present

## 2023-02-06 DIAGNOSIS — E785 Hyperlipidemia, unspecified: Secondary | ICD-10-CM | POA: Diagnosis not present

## 2023-02-06 DIAGNOSIS — E66813 Obesity, class 3: Secondary | ICD-10-CM | POA: Diagnosis present

## 2023-02-06 DIAGNOSIS — Z823 Family history of stroke: Secondary | ICD-10-CM

## 2023-02-06 DIAGNOSIS — E861 Hypovolemia: Secondary | ICD-10-CM | POA: Diagnosis present

## 2023-02-06 DIAGNOSIS — Z825 Family history of asthma and other chronic lower respiratory diseases: Secondary | ICD-10-CM

## 2023-02-06 DIAGNOSIS — K219 Gastro-esophageal reflux disease without esophagitis: Secondary | ICD-10-CM | POA: Diagnosis present

## 2023-02-06 DIAGNOSIS — Z83438 Family history of other disorder of lipoprotein metabolism and other lipidemia: Secondary | ICD-10-CM

## 2023-02-06 DIAGNOSIS — E871 Hypo-osmolality and hyponatremia: Secondary | ICD-10-CM | POA: Diagnosis present

## 2023-02-06 DIAGNOSIS — Z794 Long term (current) use of insulin: Secondary | ICD-10-CM | POA: Diagnosis not present

## 2023-02-06 DIAGNOSIS — Z79899 Other long term (current) drug therapy: Secondary | ICD-10-CM

## 2023-02-06 DIAGNOSIS — Z7901 Long term (current) use of anticoagulants: Secondary | ICD-10-CM

## 2023-02-06 LAB — TYPE AND SCREEN
Antibody Screen: NEGATIVE
Unit division: 0
Unit division: 0

## 2023-02-06 LAB — URINALYSIS, W/ REFLEX TO CULTURE (INFECTION SUSPECTED)
Bilirubin Urine: NEGATIVE
Glucose, UA: NEGATIVE mg/dL
Hgb urine dipstick: NEGATIVE
Ketones, ur: NEGATIVE mg/dL
Leukocytes,Ua: NEGATIVE
Nitrite: NEGATIVE
Protein, ur: NEGATIVE mg/dL
Specific Gravity, Urine: 1.018 (ref 1.005–1.030)
pH: 5 (ref 5.0–8.0)

## 2023-02-06 LAB — COMPREHENSIVE METABOLIC PANEL
ALT: 22 U/L (ref 0–44)
AST: 23 U/L (ref 15–41)
Albumin: 2.4 g/dL — ABNORMAL LOW (ref 3.5–5.0)
Alkaline Phosphatase: 42 U/L (ref 38–126)
Anion gap: 17 — ABNORMAL HIGH (ref 5–15)
BUN: 33 mg/dL — ABNORMAL HIGH (ref 6–20)
CO2: 17 mmol/L — ABNORMAL LOW (ref 22–32)
Calcium: 8 mg/dL — ABNORMAL LOW (ref 8.9–10.3)
Chloride: 97 mmol/L — ABNORMAL LOW (ref 98–111)
Creatinine, Ser: 2.38 mg/dL — ABNORMAL HIGH (ref 0.61–1.24)
GFR, Estimated: 33 mL/min — ABNORMAL LOW (ref >60)
Glucose, Bld: 415 mg/dL — ABNORMAL HIGH (ref 70–99)
Potassium: 3.9 mmol/L (ref 3.5–5.1)
Sodium: 131 mmol/L — ABNORMAL LOW (ref 135–145)
Total Bilirubin: 0.7 mg/dL (ref 0.3–1.2)
Total Protein: 5.8 g/dL — ABNORMAL LOW (ref 6.5–8.1)

## 2023-02-06 LAB — BPAM RBC
Blood Product Expiration Date: 202405282359
ISSUE DATE / TIME: 202405122043
Unit Type and Rh: 6200
Unit Type and Rh: 6200

## 2023-02-06 LAB — PROTIME-INR
INR: 1.5 — ABNORMAL HIGH (ref 0.8–1.2)
Prothrombin Time: 18.4 seconds — ABNORMAL HIGH (ref 11.4–15.2)

## 2023-02-06 LAB — CBC WITH DIFFERENTIAL/PLATELET
Abs Immature Granulocytes: 0.3 10*3/uL — ABNORMAL HIGH (ref 0.00–0.07)
Basophils Absolute: 0 10*3/uL (ref 0.0–0.1)
Basophils Relative: 0 %
Eosinophils Absolute: 0 10*3/uL (ref 0.0–0.5)
Eosinophils Relative: 0 %
HCT: 14.9 % — ABNORMAL LOW (ref 39.0–52.0)
Hemoglobin: 4.4 g/dL — CL (ref 13.0–17.0)
Immature Granulocytes: 2 %
Lymphocytes Relative: 9 %
Lymphs Abs: 1.4 10*3/uL (ref 0.7–4.0)
MCH: 26.5 pg (ref 26.0–34.0)
MCHC: 29.5 g/dL — ABNORMAL LOW (ref 30.0–36.0)
MCV: 89.8 fL (ref 80.0–100.0)
Monocytes Absolute: 0.6 10*3/uL (ref 0.1–1.0)
Monocytes Relative: 4 %
Neutro Abs: 13.8 10*3/uL — ABNORMAL HIGH (ref 1.7–7.7)
Neutrophils Relative %: 85 %
Platelets: 347 10*3/uL (ref 150–400)
RBC: 1.66 MIL/uL — ABNORMAL LOW (ref 4.22–5.81)
RDW: 14.2 % (ref 11.5–15.5)
WBC: 16.2 10*3/uL — ABNORMAL HIGH (ref 4.0–10.5)
nRBC: 1.2 % — ABNORMAL HIGH (ref 0.0–0.2)

## 2023-02-06 LAB — APTT: aPTT: 38 seconds — ABNORMAL HIGH (ref 24–36)

## 2023-02-06 LAB — GLUCOSE, CAPILLARY: Glucose-Capillary: 168 mg/dL — ABNORMAL HIGH (ref 70–99)

## 2023-02-06 LAB — PREPARE RBC (CROSSMATCH)

## 2023-02-06 LAB — LACTIC ACID, PLASMA: Lactic Acid, Venous: 5.5 mmol/L (ref 0.5–1.9)

## 2023-02-06 MED ORDER — HEPARIN (PORCINE) 25000 UT/250ML-% IV SOLN: 2500.0000 [IU]/h | INTRAVENOUS | Status: DC

## 2023-02-06 MED ORDER — SERTRALINE HCL 50 MG PO TABS
50.0000 mg | ORAL_TABLET | Freq: Every day | ORAL | Status: DC
  Administered 2023-02-07 – 2023-02-17 (×11): 50 mg via ORAL
  Filled 2023-02-06 (×11): qty 1

## 2023-02-06 MED ORDER — METHOCARBAMOL 500 MG PO TABS: 500.0000 mg | ORAL_TABLET | Freq: Four times a day (QID) | ORAL | Status: DC | PRN

## 2023-02-06 MED ORDER — ONDANSETRON HCL 4 MG/2ML IJ SOLN: 4.0000 mg | Freq: Four times a day (QID) | INTRAMUSCULAR | Status: DC | PRN

## 2023-02-06 MED ORDER — PANTOPRAZOLE SODIUM 40 MG PO TBEC
40.0000 mg | DELAYED_RELEASE_TABLET | Freq: Every day | ORAL | Status: DC
  Administered 2023-02-06 – 2023-02-17 (×12): 40 mg via ORAL
  Filled 2023-02-06 (×12): qty 1

## 2023-02-06 MED ORDER — ACETAMINOPHEN 325 MG PO TABS: 650.0000 mg | ORAL_TABLET | Freq: Four times a day (QID) | ORAL | Status: DC | PRN

## 2023-02-06 MED ORDER — OXYCODONE HCL 5 MG PO TABS
10.0000 mg | ORAL_TABLET | ORAL | Status: DC | PRN
  Administered 2023-02-06 – 2023-02-07 (×2): 10 mg via ORAL
  Filled 2023-02-06: qty 3
  Filled 2023-02-06: qty 2

## 2023-02-06 MED ORDER — INSULIN ASPART 100 UNIT/ML IJ SOLN
0.0000 [IU] | Freq: Three times a day (TID) | INTRAMUSCULAR | Status: DC
  Administered 2023-02-07 (×2): 3 [IU] via SUBCUTANEOUS
  Administered 2023-02-07: 2 [IU] via SUBCUTANEOUS
  Administered 2023-02-08 – 2023-02-09 (×3): 3 [IU] via SUBCUTANEOUS
  Administered 2023-02-09: 5 [IU] via SUBCUTANEOUS
  Administered 2023-02-09 – 2023-02-10 (×3): 2 [IU] via SUBCUTANEOUS
  Administered 2023-02-10 – 2023-02-11 (×2): 3 [IU] via SUBCUTANEOUS
  Administered 2023-02-11: 2 [IU] via SUBCUTANEOUS
  Administered 2023-02-11: 3 [IU] via SUBCUTANEOUS
  Administered 2023-02-12: 2 [IU] via SUBCUTANEOUS
  Administered 2023-02-12 – 2023-02-13 (×4): 3 [IU] via SUBCUTANEOUS
  Administered 2023-02-13: 5 [IU] via SUBCUTANEOUS
  Administered 2023-02-14 – 2023-02-15 (×6): 3 [IU] via SUBCUTANEOUS
  Administered 2023-02-16: 2 [IU] via SUBCUTANEOUS
  Administered 2023-02-16: 3 [IU] via SUBCUTANEOUS
  Administered 2023-02-16: 5 [IU] via SUBCUTANEOUS
  Administered 2023-02-17: 3 [IU] via SUBCUTANEOUS

## 2023-02-06 MED ORDER — ONDANSETRON HCL 4 MG/2ML IJ SOLN
4.0000 mg | Freq: Once | INTRAMUSCULAR | Status: AC
  Administered 2023-02-06: 4 mg via INTRAVENOUS
  Filled 2023-02-06: qty 2

## 2023-02-06 MED ORDER — SODIUM CHLORIDE 0.9 % IV SOLN
8.0000 mg/kg | Freq: Every day | INTRAVENOUS | Status: DC
  Administered 2023-02-06 – 2023-02-16 (×11): 900 mg via INTRAVENOUS
  Filled 2023-02-06 (×13): qty 18

## 2023-02-06 MED ORDER — LACTATED RINGERS IV BOLUS
1000.0000 mL | Freq: Once | INTRAVENOUS | Status: AC
  Administered 2023-02-06: 1000 mL via INTRAVENOUS

## 2023-02-06 MED ORDER — SODIUM CHLORIDE 0.9% FLUSH
10.0000 mL | INTRAVENOUS | Status: DC | PRN
  Administered 2023-02-07: 10 mL

## 2023-02-06 MED ORDER — SODIUM CHLORIDE 0.9% IV SOLUTION: Freq: Once | INTRAVENOUS | Status: AC

## 2023-02-06 MED ORDER — INSULIN ASPART 100 UNIT/ML IJ SOLN
3.0000 [IU] | Freq: Three times a day (TID) | INTRAMUSCULAR | Status: DC
  Administered 2023-02-07 – 2023-02-17 (×31): 3 [IU] via SUBCUTANEOUS

## 2023-02-06 MED ORDER — METOPROLOL TARTRATE 25 MG PO TABS
25.0000 mg | ORAL_TABLET | Freq: Two times a day (BID) | ORAL | Status: DC
  Administered 2023-02-07 – 2023-02-17 (×21): 25 mg via ORAL
  Filled 2023-02-06 (×22): qty 1

## 2023-02-06 MED ORDER — ACETAMINOPHEN 650 MG RE SUPP: 650.0000 mg | Freq: Four times a day (QID) | RECTAL | Status: DC | PRN

## 2023-02-06 MED ORDER — SODIUM CHLORIDE 0.9 % IV SOLN: INTRAVENOUS | Status: AC

## 2023-02-06 MED ORDER — CHLORHEXIDINE GLUCONATE CLOTH 2 % EX PADS
6.0000 | MEDICATED_PAD | Freq: Every day | CUTANEOUS | Status: DC
  Administered 2023-02-07 – 2023-02-17 (×10): 6 via TOPICAL

## 2023-02-06 MED ORDER — INSULIN ASPART 100 UNIT/ML IJ SOLN
10.0000 [IU] | Freq: Once | INTRAMUSCULAR | Status: AC
  Administered 2023-02-06: 10 [IU] via SUBCUTANEOUS

## 2023-02-06 MED ORDER — INSULIN DETEMIR 100 UNIT/ML ~~LOC~~ SOLN
12.0000 [IU] | Freq: Two times a day (BID) | SUBCUTANEOUS | Status: DC
  Administered 2023-02-06 – 2023-02-17 (×22): 12 [IU] via SUBCUTANEOUS
  Filled 2023-02-06 (×23): qty 0.12

## 2023-02-06 MED ORDER — OXYCODONE HCL 5 MG PO TABS
5.0000 mg | ORAL_TABLET | ORAL | Status: DC | PRN
  Administered 2023-02-07 – 2023-02-16 (×13): 10 mg via ORAL
  Filled 2023-02-06 (×14): qty 2

## 2023-02-06 MED ORDER — INSULIN ASPART 100 UNIT/ML IJ SOLN
0.0000 [IU] | Freq: Every day | INTRAMUSCULAR | Status: DC
  Administered 2023-02-14: 2 [IU] via SUBCUTANEOUS

## 2023-02-06 MED ORDER — POLYETHYLENE GLYCOL 3350 17 G PO PACK
17.0000 g | PACK | Freq: Every day | ORAL | Status: AC
  Administered 2023-02-06: 17 g via ORAL
  Filled 2023-02-06 (×2): qty 1

## 2023-02-06 MED ORDER — ONDANSETRON HCL 4 MG PO TABS: 4.0000 mg | ORAL_TABLET | Freq: Four times a day (QID) | ORAL | Status: DC | PRN

## 2023-02-06 MED ORDER — EZETIMIBE 10 MG PO TABS
10.0000 mg | ORAL_TABLET | Freq: Every day | ORAL | Status: DC
  Administered 2023-02-07 – 2023-02-17 (×11): 10 mg via ORAL
  Filled 2023-02-06 (×11): qty 1

## 2023-02-06 NOTE — H&P (Addendum)
History and Physical  ENRRIQUE Long WGN:[id number] DOB: 13-Mar-1976 DOA: 02/06/2023  PCP: [name], DO Patient coming from: home  I have personally briefly reviewed patient's old medical records in [facility]   Chief Complaint: bleeding from wound and generalized weakness  HPI: Adam Long is a 47 y.o. male past medical history significant for anxiety coronary artery disease, essential hypertension diabetes mellitus type 2 mechanical aortic valve currently on Coumadin and Lovenox overlap, recently discharged from the hospital on 02/03/2023 for left BKA MRSA stump requiring revision (by Dr. Lajoyce Corners on 01/29/2023) surgical cultures confirm or say at home he was sent home on daptomycin with end of date 03/25/2023. Went home he related he hit his leg when he got home, relates started bleeding on 02/04/2023 significantly, related he has not started his continue to take his Lovenox and Coumadin bridging denies any fever he just felt generalized weakness.  In the ED: Was found to have his blood pressure borderline tachycardic, afebrile with a white count of 16.2, hemoglobin of 4.4, sodium 131, creatinine of 2.3 (with a baseline creatinine of less than 1), INR 1.4 PT of 18, blood glucose of 415.   Review of Systems: All systems reviewed and apart from history of presenting illness, are negative.  Past Medical History:  Diagnosis Date   Anxiety    Aortic stenosis    Cellulitis and abscess of lower extremity 06/11/2019   Cellulitis of fourth toe of left foot    Cholelithiasis    Coronary artery disease    Nonobstructive CAD (40-50% LAD) 08/2019   Depression    Diabetes mellitus without complication (HCC)    Phreesia 09/27/2020   Elevated troponin level not due myocardial infarction 11/11/2019   Essential hypertension    Gangrene of toe of left foot (HCC) 07/06/2019   Heart murmur    Phreesia 09/27/2020   Hyperlipidemia    Phreesia 09/27/2020   Hypertension    Phreesia  09/27/2020   Mixed hyperlipidemia    Morbid obesity (HCC)    S/P aortic valve replacement with mechanical valve 12/05/2019   25 mm Carbomedics top hat bileaflet mechanical valve via partial upper hemi-sternotomy   Severe aortic stenosis 09/24/2019   Type 2 diabetes mellitus (HCC)    Past Surgical History:  Procedure Laterality Date   ABDOMINAL AORTOGRAM W/LOWER EXTREMITY N/A 07/06/2019   Procedure: ABDOMINAL AORTOGRAM W/LOWER EXTREMITY;  Surgeon: Sherren Kerns, MD;  Location: [facility] INVASIVE CV LAB;  Service: Cardiovascular;  Laterality: N/A;  Bilateral   AMPUTATION Left 07/09/2019   Procedure: LEFT FOURTH and Fifth TOE AMPUTATION.;  Surgeon: Larina Earthly, MD;  Location: [facility] OR;  Service: Vascular;  Laterality: Left;   AMPUTATION Left 03/11/2021   Procedure: LEFT FOOT 5TH  AND 4TH RAY AMPUTATION;  Surgeon: Nadara Mustard, MD;  Location: [facility] OR;  Service: Orthopedics;  Laterality: Left;   AMPUTATION Left 08/28/2021   Procedure: AMPUTATION BELOW KNEE;  Surgeon: Nadara Mustard, MD;  Location: [facility] OR;  Service: Orthopedics;  Laterality: Left;   AMPUTATION Left 01/29/2023   Procedure: LEFT AMPUTATION BELOW KNEE REVISION AND CLOSURE;  Surgeon: Nadara Mustard, MD;  Location: [facility] OR;  Service: Orthopedics;  Laterality: Left;   AORTIC VALVE REPLACEMENT N/A 12/05/2019   Procedure: PARTIAL STERNOTOMY FOR AORTIC VALVE REPLACEMENT (AVR), USING CARBOMEDICS SUPRA-ANNULAR TOP HAT ;  Surgeon: Purcell Nails, MD;  Location: [facility] OR;  Service: Open Heart Surgery;  Laterality: N/A;  No neck lines on left  APPLICATION OF WOUND VAC Left 10/30/2021   Procedure: APPLICATION OF WOUND VAC;  Surgeon: Nadara Mustard, MD;  Location: [facility] OR;  Service: Orthopedics;  Laterality: Left;   APPLICATION OF WOUND VAC Left 12/25/2021   Procedure: APPLICATION OF WOUND VAC;  Surgeon: Nadara Mustard, MD;  Location: [facility] OR;  Service: Orthopedics;  Laterality: Left;   APPLICATION OF WOUND VAC Left 01/26/2023   Procedure: APPLICATION OF WOUND  VAC TO LEFT STUMP;  Surgeon: Tarry Kos, MD;  Location: [facility] OR;  Service: Orthopedics;  Laterality: Left;   CARDIAC VALVE REPLACEMENT N/A    Phreesia 09/27/2020   I & D EXTREMITY Left 01/26/2023   Procedure: IRRIGATION AND DEBRIDEMENT OF LEFT BKA STUMP;  Surgeon: Tarry Kos, MD;  Location: [facility] OR;  Service: Orthopedics;  Laterality: Left;   IR RADIOLOGY PERIPHERAL GUIDED IV START  10/05/2019   IR US GUIDE VASC ACCESS RIGHT  10/05/2019   MULTIPLE EXTRACTIONS WITH ALVEOLOPLASTY N/A 10/26/2019   Procedure: EXTRACTION OF TOOTH #'S 3, 5-11,19-28,  AND 32 WITH ALVEOLOPLASTY;  Surgeon: Charlynne Pander, DDS;  Location: [facility] OR;  Service: Oral Surgery;  Laterality: N/A;   RIGHT HEART CATH AND CORONARY ANGIOGRAPHY N/A 09/24/2019   Procedure: RIGHT HEART CATH AND CORONARY ANGIOGRAPHY;  Surgeon: Tonny Bollman, MD;  Location: [facility] INVASIVE CV LAB;  Service: Cardiovascular;  Laterality: N/A;   STUMP REVISION Left 10/30/2021   Procedure: REVISION LEFT BELOW KNEE AMPUTATION;  Surgeon: Nadara Mustard, MD;  Location: [facility] OR;  Service: Orthopedics;  Laterality: Left;   STUMP REVISION Left 12/25/2021   Procedure: REVISION LEFT BELOW KNEE AMPUTATION;  Surgeon: Nadara Mustard, MD;  Location: [facility] OR;  Service: Orthopedics;  Laterality: Left;   TEE WITHOUT CARDIOVERSION N/A 12/05/2019   Procedure: TRANSESOPHAGEAL ECHOCARDIOGRAM (TEE);  Surgeon: Purcell Nails, MD;  Location: [facility] OR;  Service: Open Heart Surgery;  Laterality: N/A;   TEE WITHOUT CARDIOVERSION N/A 09/01/2021   Procedure: TRANSESOPHAGEAL ECHOCARDIOGRAM (TEE);  Surgeon: Little Ishikawa, MD;  Location: [facility] ENDOSCOPY;  Service: Cardiovascular;  Laterality: N/A;   Social History:  reports that he has never smoked. He quit smokeless tobacco use about 3 years ago.  His smokeless tobacco use included chew. He reports current alcohol use of about 4.0 standard drinks of alcohol per week. He reports that he does not use drugs.   No Known Allergies  Family History   Problem Relation Age of Onset   Cancer Mother        Brain tumor   Heart disease Father    Hyperlipidemia Father    Hypertension Father    Stroke Father    Heart murmur Sister    Heart attack Maternal Grandmother    Liver disease Maternal Grandfather    Breast cancer Paternal Grandmother    COPD Paternal Grandfather    Heart attack Maternal Aunt    Colon cancer Maternal Uncle 67   Pancreatic cancer Paternal Aunt    Heart disease Paternal Uncle    Prostate cancer Neg Hx      Prior to Admission medications   Medication Sig Start Date End Date Taking? Authorizing Provider  daptomycin (CUBICIN) IVPB Inject 900 mg into the vein daily. Indication:  BKA site infection  First Dose: Yes Last Day of Therapy:  03/25/23  Labs - Once weekly:  CBC/D, BMP, and CPK Labs - Every other week:  ESR and CRP Method of administration: IV Push Method of administration may be changed at the discretion of  home infusion pharmacist based upon assessment of the patient and/or caregiver's ability to self-administer the medication ordered. 02/01/23 03/25/23  Hongalgi, Maximino Greenland, MD  enoxaparin (LOVENOX) 100 MG/ML injection Inject 1 mL (100 mg total) into the skin 2 (two) times daily for 10 days. *Use with the 60mg /0.74mL syringe for a total of 160mg /dose* 02/03/23 02/13/23  Rhetta Mura, MD  enoxaparin (LOVENOX) 60 MG/0.6ML injection Inject 0.6 mLs (60 mg total) into the skin 2 (two) times daily for 10 days. *Use with the 100mg /mL syringe for a total of 160mg /dose* 02/03/23 02/13/23  Rhetta Mura, MD  ezetimibe (ZETIA) 10 MG tablet Take 1 tablet (10 mg total) by mouth daily. 10/12/22   [name], DO  gabapentin (NEURONTIN) 300 MG capsule Take 2 capsules (600 mg total) by mouth 2 (two) times daily. 10/12/22   [name], DO  insulin glargine (LANTUS) 100 UNIT/ML Solostar Pen Inject 30 Units into the skin daily. 02/04/23   Rhetta Mura, MD  Insulin Pen Needle 32G X 4 MM MISC Use with Lantus flexpen  02/03/23   Rhetta Mura, MD  lisinopril (ZESTRIL) 20 MG tablet Take 1 tablet (20 mg total) by mouth daily. 10/12/22   [name], DO  metFORMIN (GLUCOPHAGE) 500 MG tablet Take 2 tablets (1,000 mg total) by mouth 2 (two) times daily with a meal. 10/12/22   Cook, Verdis Frederickson, DO  methocarbamol (ROBAXIN) 500 MG tablet Take 1 tablet (500 mg total) by mouth every 6 (six) hours as needed for muscle spasms. 02/03/23   Rhetta Mura, MD  metoprolol tartrate (LOPRESSOR) 25 MG tablet Take 1 tablet (25 mg total) by mouth 2 (two) times daily. 10/12/22   [name], DO  oxyCODONE (OXY IR/ROXICODONE) 5 MG immediate release tablet Take 1-2 tablets (5-10 mg total) by mouth every 4 (four) hours as needed for moderate pain (pain score 4-6). 02/03/23   Rhetta Mura, MD  Oxycodone HCl 10 MG TABS Take 1-1.5 tablets (10-15 mg total) by mouth every 4 (four) hours as needed for severe pain (pain score 7-10). 02/03/23   Rhetta Mura, MD  pantoprazole (PROTONIX) 40 MG tablet Take 1 tablet (40 mg total) by mouth daily. 10/12/22   [name], DO  sertraline (ZOLOFT) 50 MG tablet Take 1 tablet (50 mg total) by mouth daily. 10/12/22   [name], DO  warfarin (COUMADIN) 10 MG tablet Take 1 tablet daily except 1/2 tablet on Mondays, Wednesdays and Fridays or as directed 02/10/22   Jonelle Sidle, MD  Zinc Sulfate 220 (50 Zn) MG TABS Take 1 tablet (220 mg total) by mouth daily. 02/04/23   Rhetta Mura, MD   Physical Exam: Vitals:   02/06/23 1330 02/06/23 1331 02/06/23 1345 02/06/23 1400  BP: (!) 114/92 (!) 114/92 (!) 107/52 102/68  Pulse:  (!) 114 (!) 112 97  Resp: 14 (!) 24 (!) 24 (!) 24  Temp:  97.6 F (36.4 C)  97.8 F (36.6 C)  TempSrc:  Oral  Oral  SpO2:  100% 100% 100%  Weight:      Height:        General exam: Moderately built and nourished patient, lying comfortably supine on the gurney in no obvious distress. Head, eyes and ENT: Nontraumatic and normocephalic.  Neck: Supple.  No JVD, carotid bruit or thyromegaly. Lymphatics: No lymphadenopathy. Respiratory system: Clear to auscultation. No increased work of breathing. Cardiovascular system: S1 and S2 heard, RRR. No JVD. Gastrointestinal system: Abdomen is nondistended, soft and nontender. Normal  bowel sounds heard. No organomegaly or masses appreciated. Central nervous system: Alert and oriented. No focal neurological deficits. Extremities: His left stump is oozing blood through the dressing.  Not tender to touch. Skin: No rashes or acute findings. Musculoskeletal system: Negative exam. Psychiatry: Pleasant and cooperative.   Labs on Admission:  Basic Metabolic Panel: Recent Labs  Lab 02/02/23 0433 02/06/23 1110  NA 133* 131*  K 3.5 3.9  CL 103 97*  CO2 25 17*  GLUCOSE 168* 415*  BUN 10 33*  CREATININE 0.77 2.38*  CALCIUM 8.2* 8.0*   Liver Function Tests: Recent Labs  Lab 02/06/23 1110  AST 23  ALT 22  ALKPHOS 42  BILITOT 0.7  PROT 5.8*  ALBUMIN 2.4*   No results for input(s): "LIPASE", "AMYLASE" in the last 168 hours. No results for input(s): "AMMONIA" in the last 168 hours. CBC: Recent Labs  Lab 02/01/23 0305 02/01/23 1139 02/02/23 0433 02/03/23 0521 02/06/23 1110  WBC 6.5 6.9 6.8 7.4 16.2*  NEUTROABS  --   --   --   --  13.8*  HGB 7.7* 7.6* 8.2* 8.3* 4.4*  HCT 23.7* 24.7* 26.2* 26.5* 14.9*  MCV 87.8 91.1 89.7 89.2 89.8  PLT 277 322 310 319 347   Cardiac Enzymes: Recent Labs  Lab 02/01/23 1200  CKTOTAL 25*    BNP (last 3 results) No results for input(s): "PROBNP" in the last 8760 hours. CBG: Recent Labs  Lab 02/02/23 1210 02/02/23 1611 02/02/23 2036 02/03/23 0745 02/03/23 1158  GLUCAP 171* 128* 129* 141* 154*    Radiological Exams on Admission: DG Chest Port 1 View  Result Date: 02/06/2023 CLINICAL DATA:  Questionable sepsis. EXAM: PORTABLE CHEST 1 VIEW COMPARISON:  January 01, 2020 FINDINGS: A right PICC line terminates in the central SVC. The heart, hila,  mediastinum, lungs, and pleura are otherwise normal. IMPRESSION: No active disease. Electronically Signed   By: Gerome Sam III M.D.   On: 02/06/2023 12:35    EKG: Independently reviewed.  Sinus tachycardia normal axis nonspecific T wave changes  Assessment/Plan Acute blood loss anemia in the setting of Coumadin and Lovenox overlap: He relates trauma to the leg on the day he got home. He has been oozing for 3 days prior to admission. Hemoglobin on admission is 4.4 (upon discharge 8.3), agree with transfusing him 3 units of packed red blood cells check a CBC posttransfusion. Hold Coumadin and Lovenox. We can resume IV heparin tomorrow morning. If the bleeding slows down and/or stop can restart Coumadin tomorrow.  Acute kidney injury: Likely prerenal in the setting of ongoing ACE inhibitor use. Hold ACE inhibitor for now , we will go ahead and hydrate him aggressively.  He is also getting 3 units of packed red blood cells which should help with volume expansion. Recheck basic metabolic panel tomorrow morning.  Hyponatremia: Likely pseudohyponatremia and a component of hypovolemia. He will get 3 units of packed red blood cells and IV fluids. Will check a basic metabolic panel in the morning.  Uncontrolled type 2 diabetes mellitus with hyperglycemia (HCC): Likely reactive in the setting of severe anemia. Will allow him a diet, continue his long-acting insulin plus sensitive sliding scale adjusted for his renal dysfunction. After his renal dysfunction resolved his sliding scale will have to be adjusted, for now we will continue him on sensitive sliding scale  Mechanical aortic valve repair in 2021: Hold Coumadin and Lovenox. Can resume IV heparin tomorrow morning. Hopefully the bleeding will subside. Once he is started back  on his Coumadin is going INR is 2.5-3.5.  S/P BKA (below knee amputation), left (HCC)/with history of left BKA MR stent stump abscess status post revision by due  to on 01/29/2023 ED physician has consulted the orthopedic surgeon Dr. Cheree Ditto to evaluate the left stump to see if it needs reintervention or repair. He has a leukocytosis room think he is infected he denies any fevers elevation in the lactic acid and his white blood cell count is probably reactive due to his severe anemia. Continue IV daptomycin. Sepsis has been ruled out.  Leukocytosis: Likely reactive likely due to severe acute blood loss, has remained afebrile denies any fevers at home. Continue IV daptomycin.  Essential HTN (hypertension) Continue metoprolol hold lisinopril.  Hyperlipidemia associated with type 2 diabetes mellitus (HCC) Continue statins.  Coronary artery disease: No anginal symptoms continue statins.  Anxiety: Continue Zoloft.  GERD: Continue Protonix.    DVT Prophylaxis: lovenox Code Status: Full  Family Communication: none  Disposition Plan: Inpatient    It is my clinical opinion that admission to INPATIENT is reasonable and necessary in this 47 y.o. male severe acute blood loss anemia from his left lower extremity BKA stump status post trauma new acute kidney injury and hyperglycemia   Given the aforementioned, the predictability of an adverse outcome is felt to be significant. I expect that the patient will require at least 2 midnights in the hospital to treat this condition.  Marinda Elk MD [facility]   02/06/2023, 3:02 PM

## 2023-02-06 NOTE — ED Notes (Signed)
Left leg stump dressed with xeroform, abd pad, kurlix and coban at this time.  Pads under leg changed and leg elevated to comfort.

## 2023-02-06 NOTE — ED Notes (Signed)
ED TO INPATIENT HANDOFF REPORT  ED Nurse Name and Phone #: Victorino Dike [phone]  S Name/Age/Gender Adam Long 47 y.o. male Room/Bed: 033C/033C  Code Status   Code Status: Full Code  Home/SNF/Other Home Patient oriented to: self, place, time, and situation Is this baseline? Yes   Triage Complete: Triage complete  Chief Complaint Sepsis (HCC) [A41.9] Acute blood loss anemia [D62]  Triage Note Pt to ER via EMS with reports of recent amputation bleeding.  Pt had left BKA recently and then was hospitalized due to infection at which time he had a revision.  Pt states bleeding started last night and has continued.   Allergies No Known Allergies  Level of Care/Admitting Diagnosis ED Disposition     ED Disposition  Admit   Condition  --   Comment  Hospital Area: [facility] [[id number]]  Level of Care: Progressive [102]  Admit to Progressive based on following criteria: CARDIOVASCULAR & THORACIC of moderate stability with acute coronary syndrome symptoms/low risk myocardial infarction/hypertensive urgency/arrhythmias/heart failure potentially compromising stability and stable post cardiovascular intervention patients.  May admit patient to [facility] or [facility] if equivalent level of care is available:: No  Covid Evaluation: Asymptomatic - no recent exposure (last 10 days) testing not required  Diagnosis: Acute blood loss anemia [454098]  Admitting Physician: Marinda Elk [[id number]]  Attending Physician: Marinda Elk [[id number]]  Certification:: I certify this patient will need inpatient services for at least 2 midnights          B Medical/Surgery History Past Medical History:  Diagnosis Date   Anxiety    Aortic stenosis    Cellulitis and abscess of lower extremity 06/11/2019   Cellulitis of fourth toe of left foot    Cholelithiasis    Coronary artery disease    Nonobstructive CAD (40-50% LAD) 08/2019   Depression    Diabetes mellitus  without complication (HCC)    Phreesia 09/27/2020   Elevated troponin level not due myocardial infarction 11/11/2019   Essential hypertension    Gangrene of toe of left foot (HCC) 07/06/2019   Heart murmur    Phreesia 09/27/2020   Hyperlipidemia    Phreesia 09/27/2020   Hypertension    Phreesia 09/27/2020   Mixed hyperlipidemia    Morbid obesity (HCC)    S/P aortic valve replacement with mechanical valve 12/05/2019   25 mm Carbomedics top hat bileaflet mechanical valve via partial upper hemi-sternotomy   Severe aortic stenosis 09/24/2019   Type 2 diabetes mellitus (HCC)    Past Surgical History:  Procedure Laterality Date   ABDOMINAL AORTOGRAM W/LOWER EXTREMITY N/A 07/06/2019   Procedure: ABDOMINAL AORTOGRAM W/LOWER EXTREMITY;  Surgeon: Sherren Kerns, MD;  [facility]: [facility] INVASIVE CV LAB;  Service: Cardiovascular;  Laterality: N/A;  Bilateral   AMPUTATION Left 07/09/2019   Procedure: LEFT FOURTH and Fifth TOE AMPUTATION.;  Surgeon: Larina Earthly, MD;  Location: [facility] OR;  Service: Vascular;  Laterality: Left;   AMPUTATION Left 03/11/2021   Procedure: LEFT FOOT 5TH  AND 4TH RAY AMPUTATION;  Surgeon: Nadara Mustard, MD;  Location: [facility] OR;  Service: Orthopedics;  Laterality: Left;   AMPUTATION Left 08/28/2021   Procedure: AMPUTATION BELOW KNEE;  Surgeon: Nadara Mustard, MD;  Location: [facility] OR;  Service: Orthopedics;  Laterality: Left;   AMPUTATION Left 01/29/2023   Procedure: LEFT AMPUTATION BELOW KNEE REVISION AND CLOSURE;  Surgeon: Nadara Mustard, MD;  Location: [facility] OR;  Service: Orthopedics;  Laterality: Left;   AORTIC  VALVE REPLACEMENT N/A 12/05/2019   Procedure: PARTIAL STERNOTOMY FOR AORTIC VALVE REPLACEMENT (AVR), USING CARBOMEDICS SUPRA-ANNULAR TOP HAT ;  Surgeon: Purcell Nails, MD;  Location: [facility] OR;  Service: Open Heart Surgery;  Laterality: N/A;  No neck lines on left   APPLICATION OF WOUND VAC Left 10/30/2021   Procedure: APPLICATION OF WOUND VAC;  Surgeon: Nadara Mustard, MD;   Location: [facility] OR;  Service: Orthopedics;  Laterality: Left;   APPLICATION OF WOUND VAC Left 12/25/2021   Procedure: APPLICATION OF WOUND VAC;  Surgeon: Nadara Mustard, MD;  Location: [facility] OR;  Service: Orthopedics;  Laterality: Left;   APPLICATION OF WOUND VAC Left 01/26/2023   Procedure: APPLICATION OF WOUND VAC TO LEFT STUMP;  Surgeon: Tarry Kos, MD;  Location: [facility] OR;  Service: Orthopedics;  Laterality: Left;   CARDIAC VALVE REPLACEMENT N/A    Phreesia 09/27/2020   I & D EXTREMITY Left 01/26/2023   Procedure: IRRIGATION AND DEBRIDEMENT OF LEFT BKA STUMP;  Surgeon: Tarry Kos, MD;  Location: [facility] OR;  Service: Orthopedics;  Laterality: Left;   IR RADIOLOGY PERIPHERAL GUIDED IV START  10/05/2019   IR US GUIDE VASC ACCESS RIGHT  10/05/2019   MULTIPLE EXTRACTIONS WITH ALVEOLOPLASTY N/A 10/26/2019   Procedure: EXTRACTION OF TOOTH #'S 3, 5-11,19-28,  AND 32 WITH ALVEOLOPLASTY;  Surgeon: Charlynne Pander, DDS;  Location: [facility] OR;  Service: Oral Surgery;  Laterality: N/A;   RIGHT HEART CATH AND CORONARY ANGIOGRAPHY N/A 09/24/2019   Procedure: RIGHT HEART CATH AND CORONARY ANGIOGRAPHY;  Surgeon: Tonny Bollman, MD;  Location: [facility] INVASIVE CV LAB;  Service: Cardiovascular;  Laterality: N/A;   STUMP REVISION Left 10/30/2021   Procedure: REVISION LEFT BELOW KNEE AMPUTATION;  Surgeon: Nadara Mustard, MD;  Location: [facility] OR;  Service: Orthopedics;  Laterality: Left;   STUMP REVISION Left 12/25/2021   Procedure: REVISION LEFT BELOW KNEE AMPUTATION;  Surgeon: Nadara Mustard, MD;  Location: [facility] OR;  Service: Orthopedics;  Laterality: Left;   TEE WITHOUT CARDIOVERSION N/A 12/05/2019   Procedure: TRANSESOPHAGEAL ECHOCARDIOGRAM (TEE);  Surgeon: Purcell Nails, MD;  Location: [facility] OR;  Service: Open Heart Surgery;  Laterality: N/A;   TEE WITHOUT CARDIOVERSION N/A 09/01/2021   Procedure: TRANSESOPHAGEAL ECHOCARDIOGRAM (TEE);  Surgeon: Little Ishikawa, MD;  Location: [facility] ENDOSCOPY;  Service: Cardiovascular;  Laterality:  N/A;     A IV Location/Drains/Wounds Patient Lines/Drains/Airways Status     Active Line/Drains/Airways     Name Placement date Placement time Site Days   Peripheral IV 02/06/23 20 G 1.25" Left Antecubital 02/06/23  1625  Antecubital  less than 1   PICC Single Lumen 02/01/23 Right Basilic 45 cm 0 cm 02/01/23  1610  Basilic  5            Intake/Output Last 24 hours  Intake/Output Summary (Last 24 hours) at 02/06/2023 1706 Last data filed at 02/06/2023 1625 Gross per 24 hour  Intake 300 ml  Output --  Net 300 ml    Labs/Imaging Results for orders placed or performed during the hospital encounter of 02/06/23 (from the past 48 hour(s))  Lactic acid, plasma     Status: Abnormal   Collection Time: 02/06/23 11:10 AM  Result Value Ref Range   Lactic Acid, Venous 5.5 (HH) 0.5 - 1.9 mmol/L    Comment: CRITICAL RESULT CALLED TO, READ BACK BY AND VERIFIED WITH J,Virga Haltiwanger RN @1241  02/06/23 E,[place] Performed at [facility], [address] [place], [place] [address]  Comprehensive metabolic panel     Status: Abnormal   Collection Time: 02/06/23 11:10 AM  Result Value Ref Range   Sodium 131 (L) 135 - 145 mmol/L   Potassium 3.9 3.5 - 5.1 mmol/L   Chloride 97 (L) 98 - 111 mmol/L   CO2 17 (L) 22 - 32 mmol/L   Glucose, Bld 415 (H) 70 - 99 mg/dL    Comment: Glucose reference range applies only to samples taken after fasting for at least 8 hours.   BUN 33 (H) 6 - 20 mg/dL   Creatinine, Ser 0.45 (H) 0.61 - 1.24 mg/dL   Calcium 8.0 (L) 8.9 - 10.3 mg/dL   Total Protein 5.8 (L) 6.5 - 8.1 g/dL   Albumin 2.4 (L) 3.5 - 5.0 g/dL   AST 23 15 - 41 U/L   ALT 22 0 - 44 U/L   Alkaline Phosphatase 42 38 - 126 U/L   Total Bilirubin 0.7 0.3 - 1.2 mg/dL   GFR, Estimated 33 (L) >60 mL/min    Comment: (NOTE) Calculated using the CKD-EPI Creatinine Equation (2021)    Anion gap 17 (H) 5 - 15    Comment: Performed at [facility], [address] [place], [place] [address]  CBC with  Differential     Status: Abnormal   Collection Time: 02/06/23 11:10 AM  Result Value Ref Range   WBC 16.2 (H) 4.0 - 10.5 K/uL   RBC 1.66 (L) 4.22 - 5.81 MIL/uL   Hemoglobin 4.4 (LL) 13.0 - 17.0 g/dL    Comment: This critical result has verified and been called to J.Biagio Snelson RN by Rosita Kea on 05 12 2024 at 1221, and has been read back.  REPEATED TO VERIFY CORRECTED ON 05/12 AT 1222: PREVIOUSLY REPORTED AS 4.4 This critical result has verified and been called to J.Julianna Vanwagner RN by Rosita Kea on 05 12 2024 at 1221, and has been read back.     HCT 14.9 (L) 39.0 - 52.0 %   MCV 89.8 80.0 - 100.0 fL   MCH 26.5 26.0 - 34.0 pg   MCHC 29.5 (L) 30.0 - 36.0 g/dL   RDW 19.1 47.8 - 29.5 %   Platelets 347 150 - 400 K/uL   nRBC 1.2 (H) 0.0 - 0.2 %   Neutrophils Relative % 85 %   Neutro Abs 13.8 (H) 1.7 - 7.7 K/uL   Lymphocytes Relative 9 %   Lymphs Abs 1.4 0.7 - 4.0 K/uL   Monocytes Relative 4 %   Monocytes Absolute 0.6 0.1 - 1.0 K/uL   Eosinophils Relative 0 %   Eosinophils Absolute 0.0 0.0 - 0.5 K/uL   Basophils Relative 0 %   Basophils Absolute 0.0 0.0 - 0.1 K/uL   Immature Granulocytes 2 %   Abs Immature Granulocytes 0.30 (H) 0.00 - 0.07 K/uL    Comment: Performed at [facility], [address] [place], [place] [address]  Protime-INR     Status: Abnormal   Collection Time: 02/06/23 11:10 AM  Result Value Ref Range   Prothrombin Time 18.4 (H) 11.4 - 15.2 seconds   INR 1.5 (H) 0.8 - 1.2    Comment: (NOTE) INR goal varies based on device and disease states. Performed at [facility], [address] [place], [place] [address]   APTT     Status: Abnormal   Collection Time: 02/06/23 11:10 AM  Result Value Ref Range   aPTT 38 (H) 24 - 36 seconds  Comment:        IF BASELINE aPTT IS ELEVATED, SUGGEST PATIENT RISK ASSESSMENT BE USED TO DETERMINE APPROPRIATE ANTICOAGULANT THERAPY. Performed at [facility], [address] [place], [place] [address]   Type and  screen [facility]     Status: None (Preliminary result)   Collection Time: 02/06/23 11:54 AM  Result Value Ref Range   ABO/RH(D) A POS    Antibody Screen NEG    Sample Expiration 02/09/2023,2359    Unit Number [id number]    Blood Component Type RBC LR PHER1    Unit division 00    Status of Unit ISSUED    Transfusion Status OK TO TRANSFUSE    Crossmatch Result Compatible    Unit Number [id number]    Blood Component Type RBC LR PHER2    Unit division 00    Status of Unit ISSUED    Transfusion Status OK TO TRANSFUSE    Crossmatch Result      Compatible Performed at [facility], [address] [place], [place] [address]    Unit Number [id number]    Blood Component Type RED CELLS,LR    Unit division 00    Status of Unit ALLOCATED    Transfusion Status OK TO TRANSFUSE    Crossmatch Result Compatible   Urinalysis, w/ Reflex to Culture (Infection Suspected) -Urine, Clean Catch     Status: Abnormal   Collection Time: 02/06/23 12:21 PM  Result Value Ref Range   Specimen Source URINE, CLEAN CATCH    Color, Urine AMBER (A) YELLOW    Comment: BIOCHEMICALS MAY BE AFFECTED BY COLOR   APPearance CLOUDY (A) CLEAR   Specific Gravity, Urine 1.018 1.005 - 1.030   pH 5.0 5.0 - 8.0   Glucose, UA NEGATIVE NEGATIVE mg/dL   Hgb urine dipstick NEGATIVE NEGATIVE   Bilirubin Urine NEGATIVE NEGATIVE   Ketones, ur NEGATIVE NEGATIVE mg/dL   Protein, ur NEGATIVE NEGATIVE mg/dL   Nitrite NEGATIVE NEGATIVE   Leukocytes,Ua NEGATIVE NEGATIVE   RBC / HPF 0-5 0 - 5 RBC/hpf   WBC, UA 0-5 0 - 5 WBC/hpf    Comment:        Reflex urine culture not performed if WBC <=10, OR if Squamous epithelial cells >5. If Squamous epithelial cells >5 suggest recollection.    Bacteria, UA RARE (A) NONE SEEN   Squamous Epithelial / HPF 11-20 0 - 5 /HPF   Mucus PRESENT    Hyaline Casts, UA PRESENT     Comment: Performed at [facility], [address], [place], [place] [address]   Prepare RBC (crossmatch)     Status: None   Collection Time: 02/06/23 12:25 PM  Result Value Ref Range   Order Confirmation      ORDER PROCESSED BY BLOOD BANK Performed at [facility], [address] [place], [place] [address]    DG Chest Port 1 View  Result Date: 02/06/2023 CLINICAL DATA:  Questionable sepsis. EXAM: PORTABLE CHEST 1 VIEW COMPARISON:  January 01, 2020 FINDINGS: A right PICC line terminates in the central SVC. The heart, hila, mediastinum, lungs, and pleura are otherwise normal. IMPRESSION: No active disease. Electronically Signed   By: Gerome Sam III M.D.   On: 02/06/2023 12:35    Pending Labs Unresulted Labs (From admission, onward)     Start     Ordered   02/07/23 0500  CK  Weekly,   R (with TIMED occurrences)  02/06/23 1305   02/06/23 1306  CK  Once,   URGENT        02/06/23 1305   02/06/23 1110  Lactic acid, plasma  (Undifferentiated presentation (screening labs and basic nursing orders))  Now then every 2 hours,   R (with STAT occurrences)      02/06/23 1111   02/06/23 1110  Blood Culture (routine x 2)  (Undifferentiated presentation (screening labs and basic nursing orders))  BLOOD CULTURE X 2,   STAT      02/06/23 1111   Signed and Held  CBC  Tomorrow morning,   R        Signed and Held            Vitals/Pain Today's Vitals   02/06/23 1345 02/06/23 1400 02/06/23 1629 02/06/23 1654  BP: (!) 107/52 102/68 (!) 106/49 (!) 106/59  Pulse: (!) 112 97 (!) 107 99  Resp: (!) 24 (!) 24 16 16   Temp:  97.8 F (36.6 C) 98.1 F (36.7 C) 98.1 F (36.7 C)  TempSrc:  Oral  Oral  SpO2: 100% 100% 100% 100%  Weight:      Height:      PainSc:        Isolation Precautions No active isolations  Medications Medications  DAPTOmycin (CUBICIN) 900 mg in sodium chloride 0.9 % IVPB (has no administration in time range)  0.9 %  sodium chloride infusion (has no administration in time range)  sodium chloride flush (NS) 0.9 % injection 10-40 mL (has no  administration in time range)  Chlorhexidine Gluconate Cloth 2 % PADS 6 each (6 each Topical Not Given 02/06/23 1651)  lactated ringers bolus 1,000 mL (0 mLs Intravenous Stopped 02/06/23 1331)  ondansetron (ZOFRAN) injection 4 mg (4 mg Intravenous Given 02/06/23 1151)  0.9 %  sodium chloride infusion (Manually program via Guardrails IV Fluids) ( Intravenous New Bag/Given 02/06/23 1331)  insulin aspart (novoLOG) injection 10 Units (10 Units Subcutaneous Given 02/06/23 1411)  lactated ringers bolus 1,000 mL (1,000 mLs Intravenous New Bag/Given 02/06/23 1628)    Mobility walks with device     Focused Assessments Cardiac Assessment Handoff:    Lab Results  Component Value Date   CKTOTAL 25 (L) 02/01/2023   CKMB 2.4 05/26/2011   TROPONINI <0.30 07/03/2013   No results found for: "DDIMER" Does the Patient currently have chest pain? No    R Recommendations: See Admitting Provider Note  Report given to:   Additional Notes:

## 2023-02-06 NOTE — ED Provider Notes (Signed)
[place] EMERGENCY DEPARTMENT AT [facility] Provider Note   CSN: [id number] Arrival date & time: 02/06/23  1039     History  Chief Complaint  Patient presents with   Post-op Problem    Adam Long is a 47 y.o. male with history of diabetes, hypertension, BKA on left.  Patient presents to ED for evaluation of left BKA wound check.  Patient had initial BKA done by Dr. Lajoyce Corners in February.  The patient then returned to the ED on 4/28 due to concern for infection.  At this time the patient was noted to have multiple areas of abscess on CT scan without evidence of osteomyelitis.  The patient was admitted at this time for irrigation and debridement of left BKA with orthopedics on 5/1.  The patient then underwent revision of left BKA on 5/4 with Dr. Lajoyce Corners in the OR.  Patient was discharged on Thursday of this last week, 5/9.  Patient reports that on Friday, 5/10, he began having oozing and bleeding from his wound site.  Patient reports that this oozing and bleeding has continued since discharge.  He states that he has not been able to change the dressing to his wound site. The patient reports he is currently anticoagulated, bridging on Coumadin.  Patient denies any fevers at home but he is endorsing nausea without vomiting.  He is also endorsing body aches and chills.  He arrives tachycardic and hypotensive.  He denies chest pain or shortness of breath.  He is endorsing lightheadedness and dizziness.  He appears very pale.  HPI     Home Medications Prior to Admission medications   Medication Sig Start Date End Date Taking? Authorizing Provider  daptomycin (CUBICIN) IVPB Inject 900 mg into the vein daily. Indication:  BKA site infection  First Dose: Yes Last Day of Therapy:  03/25/23  Labs - Once weekly:  CBC/D, BMP, and CPK Labs - Every other week:  ESR and CRP Method of administration: IV Push Method of administration may be changed at the discretion of home infusion pharmacist  based upon assessment of the patient and/or caregiver's ability to self-administer the medication ordered. 02/01/23 03/25/23  Hongalgi, Maximino Greenland, MD  enoxaparin (LOVENOX) 100 MG/ML injection Inject 1 mL (100 mg total) into the skin 2 (two) times daily for 10 days. *Use with the 60mg /0.40mL syringe for a total of 160mg /dose* 02/03/23 02/13/23  Rhetta Mura, MD  enoxaparin (LOVENOX) 60 MG/0.6ML injection Inject 0.6 mLs (60 mg total) into the skin 2 (two) times daily for 10 days. *Use with the 100mg /mL syringe for a total of 160mg /dose* 02/03/23 02/13/23  Rhetta Mura, MD  ezetimibe (ZETIA) 10 MG tablet Take 1 tablet (10 mg total) by mouth daily. 10/12/22   [name], DO  gabapentin (NEURONTIN) 300 MG capsule Take 2 capsules (600 mg total) by mouth 2 (two) times daily. 10/12/22   [name], DO  insulin glargine (LANTUS) 100 UNIT/ML Solostar Pen Inject 30 Units into the skin daily. 02/04/23   Rhetta Mura, MD  Insulin Pen Needle 32G X 4 MM MISC Use with Lantus flexpen 02/03/23   Rhetta Mura, MD  lisinopril (ZESTRIL) 20 MG tablet Take 1 tablet (20 mg total) by mouth daily. 10/12/22   [name], DO  metFORMIN (GLUCOPHAGE) 500 MG tablet Take 2 tablets (1,000 mg total) by mouth 2 (two) times daily with a meal. 10/12/22   Cook, Verdis Frederickson, DO  methocarbamol (ROBAXIN) 500 MG tablet Take 1 tablet (500 mg  total) by mouth every 6 (six) hours as needed for muscle spasms. 02/03/23   Rhetta Mura, MD  metoprolol tartrate (LOPRESSOR) 25 MG tablet Take 1 tablet (25 mg total) by mouth 2 (two) times daily. 10/12/22   [name], DO  oxyCODONE (OXY IR/ROXICODONE) 5 MG immediate release tablet Take 1-2 tablets (5-10 mg total) by mouth every 4 (four) hours as needed for moderate pain (pain score 4-6). 02/03/23   Rhetta Mura, MD  Oxycodone HCl 10 MG TABS Take 1-1.5 tablets (10-15 mg total) by mouth every 4 (four) hours as needed for severe pain (pain score 7-10). 02/03/23   Rhetta Mura, MD  pantoprazole (PROTONIX) 40 MG tablet Take 1 tablet (40 mg total) by mouth daily. 10/12/22   [name], DO  sertraline (ZOLOFT) 50 MG tablet Take 1 tablet (50 mg total) by mouth daily. 10/12/22   [name], DO  warfarin (COUMADIN) 10 MG tablet Take 1 tablet daily except 1/2 tablet on Mondays, Wednesdays and Fridays or as directed 02/10/22   Jonelle Sidle, MD  Zinc Sulfate 220 (50 Zn) MG TABS Take 1 tablet (220 mg total) by mouth daily. 02/04/23   Rhetta Mura, MD      Allergies    Patient has no known allergies.    Review of Systems   Review of Systems  Constitutional:  Positive for chills. Negative for fever.  Respiratory:  Negative for shortness of breath.   Cardiovascular:  Negative for chest pain.  Gastrointestinal:  Positive for nausea. Negative for abdominal pain and vomiting.  Skin:  Positive for wound.  Neurological:  Positive for dizziness, weakness and light-headedness.  All other systems reviewed and are negative.   Physical Exam Updated Vital Signs BP 102/68   Pulse 97   Temp 97.8 F (36.6 C) (Oral)   Resp (!) 24   Ht 5\' 10"  (1.778 m)   Wt (!) 165.6 kg   SpO2 100%   BMI 52.37 kg/m  Physical Exam Vitals and nursing note reviewed.  Constitutional:      General: He is not in acute distress.    Appearance: He is ill-appearing. He is not toxic-appearing or diaphoretic.  HENT:     Head: Normocephalic and atraumatic.     Nose: Nose normal.     Mouth/Throat:     Mouth: Mucous membranes are moist.  Eyes:     Extraocular Movements: Extraocular movements intact.     Conjunctiva/sclera: Conjunctivae normal.     Pupils: Pupils are equal, round, and reactive to light.  Cardiovascular:     Rate and Rhythm: Normal rate and regular rhythm.  Pulmonary:     Effort: Pulmonary effort is normal.     Breath sounds: Normal breath sounds. No wheezing.  Abdominal:     General: Abdomen is flat. Bowel sounds are normal.     Palpations: Abdomen is  soft.     Tenderness: There is no abdominal tenderness.  Musculoskeletal:     Cervical back: Normal range of motion and neck supple. No tenderness.     Comments: Left-sided BKA does not appear acutely infected, no surrounding erythema, no warmth.  There is congealed blood coming from incision.  The patient stump was wrapped in severely blood saturated Coban     Left Lower Extremity: Left leg is amputated below knee.  Skin:    General: Skin is warm and dry.     Capillary Refill: Capillary refill takes less than 2 seconds.  Neurological:  Mental Status: He is alert and oriented to person, place, and time.          ED Results / Procedures / Treatments   Labs (all labs ordered are listed, but only abnormal results are displayed) Labs Reviewed  LACTIC ACID, PLASMA - Abnormal; Notable for the following components:      Result Value   Lactic Acid, Venous 5.5 (*)    All other components within normal limits  COMPREHENSIVE METABOLIC PANEL - Abnormal; Notable for the following components:   Sodium 131 (*)    Chloride 97 (*)    CO2 17 (*)    Glucose, Bld 415 (*)    BUN 33 (*)    Creatinine, Ser 2.38 (*)    Calcium 8.0 (*)    Total Protein 5.8 (*)    Albumin 2.4 (*)    GFR, Estimated 33 (*)    Anion gap 17 (*)    All other components within normal limits  CBC WITH DIFFERENTIAL/PLATELET - Abnormal; Notable for the following components:   WBC 16.2 (*)    RBC 1.66 (*)    Hemoglobin 4.4 (*)    HCT 14.9 (*)    MCHC 29.5 (*)    nRBC 1.2 (*)    Neutro Abs 13.8 (*)    Abs Immature Granulocytes 0.30 (*)    All other components within normal limits  PROTIME-INR - Abnormal; Notable for the following components:   Prothrombin Time 18.4 (*)    INR 1.5 (*)    All other components within normal limits  APTT - Abnormal; Notable for the following components:   aPTT 38 (*)    All other components within normal limits  URINALYSIS, W/ REFLEX TO CULTURE (INFECTION SUSPECTED) - Abnormal;  Notable for the following components:   Color, Urine AMBER (*)    APPearance CLOUDY (*)    Bacteria, UA RARE (*)    All other components within normal limits  CULTURE, BLOOD (ROUTINE X 2)  CULTURE, BLOOD (ROUTINE X 2)  LACTIC ACID, PLASMA  CK  TYPE AND SCREEN  PREPARE RBC (CROSSMATCH)  TROPONIN I (HIGH SENSITIVITY)    EKG EKG Interpretation  Date/Time:  Sunday Feb 06 2023 11:32:58 EDT Ventricular Rate:  113 PR Interval:  156 QRS Duration: 119 QT Interval:  347 QTC Calculation: 476 R Axis:   63 Text Interpretation: Sinus tachycardia Nonspecific intraventricular conduction delay new Repol abnrm suggests ischemia, anterolateral Confirmed by Gwyneth Sprout ([id number]) on 02/06/2023 11:41:01 AM  Radiology DG Chest Port 1 View  Result Date: 02/06/2023 CLINICAL DATA:  Questionable sepsis. EXAM: PORTABLE CHEST 1 VIEW COMPARISON:  January 01, 2020 FINDINGS: A right PICC line terminates in the central SVC. The heart, hila, mediastinum, lungs, and pleura are otherwise normal. IMPRESSION: No active disease. Electronically Signed   By: Gerome Sam III M.D.   On: 02/06/2023 12:35    Procedures .Critical Care  Performed by: Al Decant, PA-C Authorized by: Al Decant, PA-C   Critical care provider statement:    Critical care time (minutes):  75   Critical care time was exclusive of:  Separately billable procedures and treating other patients   Critical care was necessary to treat or prevent imminent or life-threatening deterioration of the following conditions:  Dehydration, metabolic crisis and sepsis   Critical care was time spent personally by me on the following activities:  Blood draw for specimens, development of treatment plan with patient or surrogate, discussions with consultants, discussions with primary provider, evaluation  of patient's response to treatment, examination of patient, interpretation of cardiac output measurements, obtaining history from patient or  surrogate, transcutaneous pacing, review of old charts, re-evaluation of patient's condition, pulse oximetry, ordering and review of radiographic studies, ordering and review of laboratory studies and ordering and performing treatments and interventions   I assumed direction of critical care for this patient from another provider in my specialty: no     Care discussed with: admitting provider      Medications Ordered in ED Medications  DAPTOmycin (CUBICIN) 900 mg in sodium chloride 0.9 % IVPB (has no administration in time range)  lactated ringers bolus 1,000 mL (has no administration in time range)  0.9 %  sodium chloride infusion (has no administration in time range)  lactated ringers bolus 1,000 mL (0 mLs Intravenous Stopped 02/06/23 1331)  ondansetron (ZOFRAN) injection 4 mg (4 mg Intravenous Given 02/06/23 1151)  0.9 %  sodium chloride infusion (Manually program via Guardrails IV Fluids) ( Intravenous New Bag/Given 02/06/23 1331)  insulin aspart (novoLOG) injection 10 Units (10 Units Subcutaneous Given 02/06/23 1411)    ED Course/ Medical Decision Making/ A&P  Medical Decision Making Amount and/or Complexity of Data Reviewed Labs: ordered. Radiology: ordered. ECG/medicine tests: ordered.  Risk Prescription drug management. Decision regarding hospitalization.   47 year old male presents to the ED for evaluation.  Please see HPI for further details.  On examination the patient is afebrile, he is tachycardic.  He is hypertensive.  His lung sounds are clear bilaterally and he is not hypoxic.  His abdomen is soft and compressible.  His left BKA stump was wrapped in a severely blood saturated Coban.  It had a very malodorous smell to it.  This was taken down, the patient had a large amount of congealed blood surrounding his incision site.  The stump does not appear acutely infected, there is no surrounding erythema or warmth.  Please see pictures for further details.  Patient CBC with  leukocytosis to 16.2.  Patient hemoglobin 4.4.  The patient was given 3 units of packed red blood cells for this. Patient lactic elevated at 5.5 most likely secondary to lack of perfusion due to blood loss Sodium 131, glucose 415, creatinine 2.38, anion gap 17.  Patient creatinine 4 days ago was 0.77 concerning for AKI.  Patient given 2 L LR for this. Patient PT/INR elevated 18.4, INR 1.5.  APTT elevated at 38.  Patient reports he is compliant on Lovenox. Urinalysis unremarkable.  Chest x-ray unremarkable.  Have ordered the patient daptomycin as chart review shows that this medication has been used and suggested by infectious disease on his past hospitalizations for wound cultures.  At this time the patient will need admission for anemia, AKI.  Patient meet SIRS criteria.  Case discussed with orthopedic consultant Dr. Steward Drone who states that they will round on the patient while he is admitted.  Patient case discussed with attending Dr. Marinda Elk who has agreed to meet the patient.   Final Clinical Impression(s) / ED Diagnoses Final diagnoses:  Anemia, unspecified type  AKI (acute kidney injury) (HCC)  Sepsis, due to unspecified organism, unspecified whether acute organ dysfunction present [facility])    Rx / DC Orders ED Discharge Orders     None         Al Decant, PA-C 02/06/23 1521    Gwyneth Sprout, MD 02/08/23 [phone]

## 2023-02-06 NOTE — Progress Notes (Signed)
ANTICOAGULATION CONSULT NOTE - Initial Consult  Pharmacy Consult for Heparin Indication:  mechanical valve; holding warfarin/enoxaparin given bleeding  No Known Allergies  Patient Measurements: Height: 5\' 10"  (177.8 cm) Weight: (!) 165.6 kg (365 lb) IBW/kg (Calculated) : 73 Heparin Dosing Weight: 113.5 kg  Vital Signs: Temp: 97.8 F (36.6 C) (05/12 1400) Temp Source: Oral (05/12 1400) BP: 102/68 (05/12 1400) Pulse Rate: 97 (05/12 1400)  Labs: Recent Labs    02/06/23 1110  HGB 4.4*  HCT 14.9*  PLT 347  APTT 38*  LABPROT 18.4*  INR 1.5*  CREATININE 2.38*    Estimated Creatinine Clearance: 60.3 mL/min (A) (by C-G formula based on SCr of 2.38 mg/dL (H)).   Medical History: Past Medical History:  Diagnosis Date   Anxiety    Aortic stenosis    Cellulitis and abscess of lower extremity 06/11/2019   Cellulitis of fourth toe of left foot    Cholelithiasis    Coronary artery disease    Nonobstructive CAD (40-50% LAD) 08/2019   Depression    Diabetes mellitus without complication (HCC)    Phreesia 09/27/2020   Elevated troponin level not due myocardial infarction 11/11/2019   Essential hypertension    Gangrene of toe of left foot (HCC) 07/06/2019   Heart murmur    Phreesia 09/27/2020   Hyperlipidemia    Phreesia 09/27/2020   Hypertension    Phreesia 09/27/2020   Mixed hyperlipidemia    Morbid obesity (HCC)    S/P aortic valve replacement with mechanical valve 12/05/2019   25 mm Carbomedics top hat bileaflet mechanical valve via partial upper hemi-sternotomy   Severe aortic stenosis 09/24/2019   Type 2 diabetes mellitus (HCC)     Medications:  (Not in a hospital admission)  Scheduled:  Infusions:   sodium chloride     DAPTOmycin (CUBICIN) 900 mg in sodium chloride 0.9 % IVPB     lactated ringers     PRN:   Assessment: 46 yom with a history of T2DM, CAD, HTN, AVR, dyslipidemia. Recent admission with left BKA stump abscess s/p surgical I&D. Discharged  from that encounter with enoxaparin/warfarin bridge. Patient is presenting with wound check / bleeding. Heparin per pharmacy (to start 5/13am) consult placed for  mechanical AVR in the setting of holding warfarin/enoxaparin given wound site bleeding .  Home warfarin regimen is 10mg  T/R/Sa/Su and 5mg  on MWF and was bridging with 160 mg q12h SQ enoxaparin. Patient reports last warfarin dose 5/10 and last enoxaparin dose 5/11am.  PT/INR 18.4/1.5 Hgb 4.4; plt 347 aPTT 38  Goal of Therapy:  Heparin level 0.3-0.5 units/ml Monitor platelets by anticoagulation protocol: Yes   Plan:  Heparin start 5/13 am per attending Will use lower goal 0.3-0.5 initially giving hgb and concern for bleeding. Recommend adjusting to higher goal when appropriate pending plan for anticoagulation moving forward and response to UFH. Notably, lower target goal was also utilized previous admission.  Start heparin infusion at 2500 units/hr 5/13 at 0900 ---previously therapeutic at 3350 units/hr but slightly above 0.5 goal ---conservatively starting at 2500 units/hr given recent heparin level data ---would consider increasing starting rate vs change to enoxaparin in the AM pending hgb and bleeding status  Check anti-Xa level 6 hours post infusion start and daily while on heparin Continue to monitor H&H and platelets  Delmar Landau, PharmD, BCPS 02/06/2023 4:22 PM ED Clinical Pharmacist -  [phone]

## 2023-02-06 NOTE — Progress Notes (Signed)
Pharmacy Antibiotic Note  Adam Long is a 47 y.o. male for which pharmacy has been consulted for daptomycin dosing for  Left BKA MRSA stump abscess s/p irrigation and debridement w/ wound VAC and s/p left BKA by Dr. Lajoyce Corners 5/4 .  Previous cultures with MRSA ID planned for daptomycin OPAT (EOT 6/28). Patient states last dose 5/10 (did not feel well enough to give yesterday's dose per patient). Was prescribed daptomycin 900mg  IV q24h.  Patient with a history of T2DM, CAD, HTN, AVR, dyslipidemia, lt BKA. Patient presenting for wound check.  SCr 2.38 - AKI WBC 16.2; LA 5.5; T 97.5; HR 106; RR 21  Plan: Continue daptomycin 900mg  q24h Trend WBC, Fever, Renal function, CK F/u cultures, clinical course, WBC F/u ID recommendations  Height: 5\' 10"  (177.8 cm) Weight: (!) 165.6 kg (365 lb) IBW/kg (Calculated) : 73  Temp (24hrs), Avg:97.5 F (36.4 C), Min:97.5 F (36.4 C), Max:97.5 F (36.4 C)  Recent Labs  Lab 02/01/23 0305 02/01/23 1139 02/02/23 0433 02/03/23 0521 02/06/23 1110  WBC 6.5 6.9 6.8 7.4 16.2*  CREATININE  --   --  0.77  --   --     Estimated Creatinine Clearance: 179.5 mL/min (by C-G formula based on SCr of 0.77 mg/dL).    No Known Allergies  Thank you for allowing pharmacy to be a part of this patient's care.  Delmar Landau, PharmD, BCPS 02/06/2023 12:37 PM ED Clinical Pharmacist -  [phone]

## 2023-02-06 NOTE — ED Triage Notes (Signed)
Pt to ER via EMS with reports of recent amputation bleeding.  Pt had left BKA recently and then was hospitalized due to infection at which time he had a revision.  Pt states bleeding started last night and has continued.

## 2023-02-07 DIAGNOSIS — D62 Acute posthemorrhagic anemia: Secondary | ICD-10-CM | POA: Diagnosis not present

## 2023-02-07 LAB — OSMOLALITY, URINE: Osmolality, Ur: 552 mOsm/kg (ref 300–900)

## 2023-02-07 LAB — CBC
HCT: 16.4 % — ABNORMAL LOW (ref 39.0–52.0)
HCT: 21.2 % — ABNORMAL LOW (ref 39.0–52.0)
HCT: 20.9 % — ABNORMAL LOW (ref 39.0–52.0)
Hemoglobin: 5.2 g/dL — CL (ref 13.0–17.0)
Hemoglobin: 7 g/dL — ABNORMAL LOW (ref 13.0–17.0)
Hemoglobin: 6.8 g/dL — CL (ref 13.0–17.0)
MCH: 27.5 pg (ref 26.0–34.0)
MCH: 28.9 pg (ref 26.0–34.0)
MCH: 28.6 pg (ref 26.0–34.0)
MCHC: 31.7 g/dL (ref 30.0–36.0)
MCHC: 33 g/dL (ref 30.0–36.0)
MCHC: 32.5 g/dL (ref 30.0–36.0)
MCV: 86.8 fL (ref 80.0–100.0)
MCV: 87.6 fL (ref 80.0–100.0)
MCV: 87.8 fL (ref 80.0–100.0)
Platelets: 258 10*3/uL (ref 150–400)
Platelets: 312 10*3/uL (ref 150–400)
Platelets: 240 10*3/uL (ref 150–400)
RBC: 1.89 MIL/uL — ABNORMAL LOW (ref 4.22–5.81)
RBC: 2.42 MIL/uL — ABNORMAL LOW (ref 4.22–5.81)
RBC: 2.38 MIL/uL — ABNORMAL LOW (ref 4.22–5.81)
RDW: 14.2 % (ref 11.5–15.5)
RDW: 14.3 % (ref 11.5–15.5)
RDW: 14.4 % (ref 11.5–15.5)
WBC: 11.2 10*3/uL — ABNORMAL HIGH (ref 4.0–10.5)
WBC: 12.9 10*3/uL — ABNORMAL HIGH (ref 4.0–10.5)
WBC: 9.9 10*3/uL (ref 4.0–10.5)
nRBC: 1.1 % — ABNORMAL HIGH (ref 0.0–0.2)
nRBC: 1.3 % — ABNORMAL HIGH (ref 0.0–0.2)
nRBC: 1.1 % — ABNORMAL HIGH (ref 0.0–0.2)

## 2023-02-07 LAB — BPAM RBC
Blood Product Expiration Date: 202406052359
Blood Product Expiration Date: 202406112359
ISSUE DATE / TIME: 202405121340
ISSUE DATE / TIME: 202405130553

## 2023-02-07 LAB — TYPE AND SCREEN
ABO/RH(D): A POS
Unit division: 0
Unit division: 0

## 2023-02-07 LAB — GLUCOSE, CAPILLARY
Glucose-Capillary: 164 mg/dL — ABNORMAL HIGH (ref 70–99)
Glucose-Capillary: 179 mg/dL — ABNORMAL HIGH (ref 70–99)
Glucose-Capillary: 141 mg/dL — ABNORMAL HIGH (ref 70–99)
Glucose-Capillary: 135 mg/dL — ABNORMAL HIGH (ref 70–99)

## 2023-02-07 LAB — BASIC METABOLIC PANEL
Anion gap: 7 (ref 5–15)
BUN: 30 mg/dL — ABNORMAL HIGH (ref 6–20)
CO2: 24 mmol/L (ref 22–32)
Calcium: 7.5 mg/dL — ABNORMAL LOW (ref 8.9–10.3)
Chloride: 99 mmol/L (ref 98–111)
Creatinine, Ser: 1.62 mg/dL — ABNORMAL HIGH (ref 0.61–1.24)
GFR, Estimated: 53 mL/min — ABNORMAL LOW (ref >60)
Glucose, Bld: 174 mg/dL — ABNORMAL HIGH (ref 70–99)
Potassium: 3.7 mmol/L (ref 3.5–5.1)
Sodium: 130 mmol/L — ABNORMAL LOW (ref 135–145)

## 2023-02-07 LAB — OSMOLALITY: Osmolality: 292 mOsm/kg (ref 275–295)

## 2023-02-07 LAB — MAGNESIUM: Magnesium: 2 mg/dL (ref 1.7–2.4)

## 2023-02-07 LAB — BRAIN NATRIURETIC PEPTIDE: B Natriuretic Peptide: 182.2 pg/mL — ABNORMAL HIGH (ref 0.0–100.0)

## 2023-02-07 LAB — CK: Total CK: 44 U/L — ABNORMAL LOW (ref 49–397)

## 2023-02-07 LAB — PREPARE RBC (CROSSMATCH)

## 2023-02-07 LAB — URIC ACID: Uric Acid, Serum: 7.9 mg/dL (ref 3.7–8.6)

## 2023-02-07 LAB — SODIUM, URINE, RANDOM: Sodium, Ur: 24 mmol/L

## 2023-02-07 LAB — CREATININE, URINE, RANDOM: Creatinine, Urine: 153 mg/dL

## 2023-02-07 MED ORDER — SODIUM CHLORIDE 0.9% IV SOLUTION
Freq: Once | INTRAVENOUS | Status: DC
Start: 2023-02-07 — End: 2023-02-07

## 2023-02-07 MED ORDER — SODIUM CHLORIDE 0.9% IV SOLUTION: Freq: Once | INTRAVENOUS | Status: AC

## 2023-02-07 NOTE — Progress Notes (Signed)
Patient ID: Adam Long, male   DOB: Dec 14, 1975, 47 y.o.   MRN: [id number] Patient is seen in follow-up status post left transtibial amputation.  Patient has had increased bleeding from his anticoagulation therapy.  Examination there is slight dehiscence with a stable hematoma.  No active bleeding.  No cellulitis.  Continue with compression and dry dressing change as needed.

## 2023-02-07 NOTE — Progress Notes (Addendum)
PROGRESS NOTE                                                                                                                                                                                                             Patient Demographics:    Adam Long, is a 47 y.o. male, DOB - [birth year], ZOX:[id number]  Outpatient Primary MD for the patient is [name], DO    LOS - 1  Admit date - 02/06/2023    Chief Complaint  Patient presents with   Post-op Problem       Brief Narrative (HPI from H&P)   46 y.o. male past medical history significant for anxiety coronary artery disease, essential hypertension diabetes mellitus type 2 mechanical aortic valve currently on Coumadin and Lovenox overlap, recently discharged from the hospital on 02/03/2023 for left BKA MRSA stump requiring revision (revision done by Dr. Lajoyce Corners on 01/29/2023, initial BKA was in 2022) surgical cultures confirm or say at home he was sent home on daptomycin with end of date 03/25/2023.   He went home and bumped his left BKA stump by mistake in his kitchen, subsequently started bleeding from the site came to the ER where he was diagnosed with significant blood loss related anemia and AKI and admitted to the hospital.   Subjective:    Adam Long today has, No headache, No chest pain, No abdominal pain - No Nausea, No new weakness tingling or numbness, No SOB   Assessment  & Plan :    Acute blood loss anemia at the left BKA stump site due to mechanical injury in the setting of Coumadin and Lovenox overlap: He had massive bleed, thus far required 4 units of packed RBC within 24 hours getting his fifth unit on 02/07/2023, fortunately bleeding seems to be going down, orthopedics on board, continue to monitor CBC, resume nonbullous heparin drip once bleeding has held up will try to resume with caution on 02/08/2023.   Acute kidney injury: Likely prerenal in the  setting of ongoing ACE inhibitor use and severe anemia, transfuse, hold ACE and monitor.  Hyponatremia: Likely pseudohyponatremia and a component of hypovolemia. Monitor clinically.  Check renal electrolytes.   Mechanical aortic valve repair in 2021: Hold Coumadin and Lovenox. His INR goal is 2-3, resume heparin without bolus with caution on 02/07/2022 if there is no  further bleed   S/P BKA (below knee amputation), left (HCC)/with history of left BKA MR stent stump abscess status post revision by due to on 01/29/2023, original left BKA was in 2022. Dr. Lajoyce Corners on board, continue wound care, his original left BKA was in 2022 followed by left BKA stump MRSA infection requiring revision by Dr. Lajoyce Corners on 01/29/2023, continue IV daptomycin with end date of 03/25/2023 > R Arm PICC.   Leukocytosis: Likely reactive likely due to severe acute blood loss, continue to monitor   Essential HTN (hypertension) Continue metoprolol hold lisinopril.   Hyperlipidemia associated with type 2 diabetes mellitus (HCC) Continue statins.   Coronary artery disease: No anginal symptoms continue statins.   Anxiety: Continue Zoloft.   GERD: Continue Protonix.   Uncontrolled type 2 diabetes mellitus with hyperglycemia (HCC): Will allow him a diet, continue his long-acting insulin plus sensitive sliding scale, poor outpt control  Lab Results  Component Value Date   HGBA1C 13.4 (H) 01/24/2023   CBG (last 3)  Recent Labs    02/06/23 2140 02/07/23 0907  GLUCAP 168* 164*         Condition - Extremely Guarded  Family Communication  :  None  Code Status :  Full  Consults  :  Ortho  PUD Prophylaxis : PPI   Procedures  :            Disposition Plan  :    Status is: Inpatient   DVT Prophylaxis  :  SCD R Leg   Lab Results  Component Value Date   PLT 258 02/07/2023    Diet :  Diet Order             Diet Carb Modified Fluid consistency: Thin; Room service appropriate? Yes  Diet effective  now                    Inpatient Medications  Scheduled Meds:  Chlorhexidine Gluconate Cloth  6 each Topical Daily   ezetimibe  10 mg Oral Daily   insulin aspart  0-15 Units Subcutaneous TID WC   insulin aspart  0-5 Units Subcutaneous QHS   insulin aspart  3 Units Subcutaneous TID WC   insulin detemir  12 Units Subcutaneous BID   metoprolol tartrate  25 mg Oral BID   pantoprazole  40 mg Oral Daily   polyethylene glycol  17 g Oral Daily   sertraline  50 mg Oral Daily   Continuous Infusions:  sodium chloride 100 mL/hr at 02/07/23 0333   DAPTOmycin (CUBICIN) 900 mg in sodium chloride 0.9 % IVPB 900 mg (02/06/23 1833)   PRN Meds:.acetaminophen **OR** acetaminophen, methocarbamol, ondansetron **OR** ondansetron (ZOFRAN) IV, oxyCODONE, sodium chloride flush  Antibiotics  :    Anti-infectives (From admission, onward)    Start     Dose/Rate Route Frequency Ordered Stop   02/06/23 1315  DAPTOmycin (CUBICIN) 900 mg in sodium chloride 0.9 % IVPB        8 mg/kg  110 kg (Adjusted) 136 mL/hr over 30 Minutes Intravenous Daily 02/06/23 1304           Objective:   Vitals:   02/07/23 0615 02/07/23 0659 02/07/23 0759 02/07/23 0849  BP: (!) 106/57 (!) 106/57 (!) 114/59 (!) 108/55  Pulse:  85 82 88  Resp:  18 (!) 21 (!) 21  Temp:  98.2 F (36.8 C) 98.1 F (36.7 C) 98 F (36.7 C)  TempSrc:  Oral Oral Oral  SpO2:  97% 100% 100%  Weight:      Height:        Wt Readings from Last 3 Encounters:  02/06/23 (!) 165.6 kg  02/02/23 (!) 168.1 kg  11/09/22 (!) 173.3 kg     Intake/Output Summary (Last 24 hours) at 02/07/2023 0925 Last data filed at 02/07/2023 0849 Gross per 24 hour  Intake 982.5 ml  Output 900 ml  Net 82.5 ml     Physical Exam  Awake Alert, No new F.N deficits, Normal affect [place].AT,PERRAL Supple Neck, No JVD,   Symmetrical Chest wall movement, Good air movement bilaterally, CTAB RRR,No Gallops,Rubs or new Murmurs,  +ve B.Sounds, Abd Soft, No tenderness,    L BKA stump under pressure bandage, R Arm PICC.   Data Review:    Recent Labs  Lab 02/01/23 1139 02/02/23 0433 02/03/23 0521 02/06/23 1110 02/07/23 0335  WBC 6.9 6.8 7.4 16.2* 11.2*  HGB 7.6* 8.2* 8.3* 4.4* 5.2*  HCT 24.7* 26.2* 26.5* 14.9* 16.4*  PLT 322 310 319 347 258  MCV 91.1 89.7 89.2 89.8 86.8  MCH 28.0 28.1 27.9 26.5 27.5  MCHC 30.8 31.3 31.3 29.5* 31.7  RDW 13.9 14.1 14.1 14.2 14.2  LYMPHSABS  --   --   --  1.4  --   MONOABS  --   --   --  0.6  --   EOSABS  --   --   --  0.0  --   BASOSABS  --   --   --  0.0  --     Recent Labs  Lab 02/01/23 0305 02/02/23 0433 02/03/23 0521 02/06/23 1110 02/07/23 0335  NA  --  133*  --  131* 130*  K  --  3.5  --  3.9 3.7  CL  --  103  --  97* 99  CO2  --  25  --  17* 24  ANIONGAP  --  5  --  17* 7  GLUCOSE  --  168*  --  415* 174*  BUN  --  10  --  33* 30*  CREATININE  --  0.77  --  2.38* 1.62*  AST  --   --   --  23  --   ALT  --   --   --  22  --   ALKPHOS  --   --   --  42  --   BILITOT  --   --   --  0.7  --   ALBUMIN  --   --   --  2.4*  --   LATICACIDVEN  --   --   --  5.5*  --   INR 1.2 1.2 1.3* 1.5*  --   BNP  --   --   --   --  182.2*  MG  --   --   --   --  2.0  CALCIUM  --  8.2*  --  8.0* 7.5*      Recent Labs  Lab 02/01/23 0305 02/02/23 0433 02/03/23 0521 02/06/23 1110 02/07/23 0335  LATICACIDVEN  --   --   --  5.5*  --   INR 1.2 1.2 1.3* 1.5*  --   BNP  --   --   --   --  182.2*  MG  --   --   --   --  2.0  CALCIUM  --  8.2*  --  8.0* 7.5*     Micro Results No results found for  this or any previous visit (from the past 240 hour(s)).  Radiology Reports DG Chest Port 1 View  Result Date: 02/06/2023 CLINICAL DATA:  Questionable sepsis. EXAM: PORTABLE CHEST 1 VIEW COMPARISON:  January 01, 2020 FINDINGS: A right PICC line terminates in the central SVC. The heart, hila, mediastinum, lungs, and pleura are otherwise normal. IMPRESSION: No active disease. Electronically Signed   By: Gerome Sam III M.D.   On: 02/06/2023 12:35      Signature  -   Susa Raring M.D on 02/07/2023 at 9:25 AM   -  To page go to [email or web address]

## 2023-02-07 NOTE — Progress Notes (Signed)
Mobility Specialist Progress Note   02/07/23 1657  Mobility  Activity Ambulated with assistance in room  Level of Assistance Contact guard assist, steadying assist  Distance Ambulated (ft) 4 ft  LLE Weight Bearing NWB  Activity Response Tolerated well  Mobility Referral Yes  $Mobility charge 1 Mobility  Mobility Specialist Start Time (ACUTE ONLY) 1638  Mobility Specialist Stop Time (ACUTE ONLY) 1651  Mobility Specialist Time Calculation (min) (ACUTE ONLY) 13 min   Pt found in bed expressing they aren't feeling mobility this afternoon but w/ encouragement pt agreeable. Able to get EOB and stand w/ MinG, CGA for hop gait ~28ft before expressing fatigue and heading back to bed. Returned back supine w/ call bell in reach and bed alarm on.   Frederico Hamman Mobility Specialist Please contact via SecureChat or  Rehab office at [phone]

## 2023-02-07 NOTE — Plan of Care (Signed)
  Problem: Education: Goal: Knowledge of the prescribed therapeutic regimen will improve Outcome: Progressing Goal: Ability to verbalize activity precautions or restrictions will improve Outcome: Progressing Goal: Understanding of discharge needs will improve Outcome: Progressing   Problem: Activity: Goal: Ability to perform//tolerate increased activity and mobilize with assistive devices will improve Outcome: Progressing   Problem: Clinical Measurements: Goal: Postoperative complications will be avoided or minimized Outcome: Progressing   Problem: Self-Care: Goal: Ability to meet self-care needs will improve Outcome: Progressing   Problem: Self-Concept: Goal: Ability to maintain and perform role responsibilities to the fullest extent possible will improve Outcome: Progressing   Problem: Pain Management: Goal: Pain level will decrease with appropriate interventions Outcome: Progressing   Problem: Skin Integrity: Goal: Demonstration of wound healing without infection will improve Outcome: Progressing   Problem: Education: Goal: Ability to describe self-care measures that may prevent or decrease complications (Diabetes Survival Skills Education) will improve Outcome: Progressing Goal: Individualized Educational Video(s) Outcome: Progressing   Problem: Coping: Goal: Ability to adjust to condition or change in health will improve Outcome: Progressing   Problem: Fluid Volume: Goal: Ability to maintain a balanced intake and output will improve Outcome: Progressing   Problem: Health Behavior/Discharge Planning: Goal: Ability to identify and utilize available resources and services will improve Outcome: Progressing Goal: Ability to manage health-related needs will improve Outcome: Progressing   Problem: Metabolic: Goal: Ability to maintain appropriate glucose levels will improve Outcome: Progressing   Problem: Nutritional: Goal: Maintenance of adequate nutrition will  improve Outcome: Progressing Goal: Progress toward achieving an optimal weight will improve Outcome: Progressing   Problem: Skin Integrity: Goal: Risk for impaired skin integrity will decrease Outcome: Progressing   Problem: Tissue Perfusion: Goal: Adequacy of tissue perfusion will improve Outcome: Progressing   Problem: Education: Goal: Knowledge of General Education information will improve Description: Including pain rating scale, medication(s)/side effects and non-pharmacologic comfort measures Outcome: Progressing   Problem: Health Behavior/Discharge Planning: Goal: Ability to manage health-related needs will improve Outcome: Progressing   Problem: Clinical Measurements: Goal: Ability to maintain clinical measurements within normal limits will improve Outcome: Progressing Goal: Will remain free from infection Outcome: Progressing Goal: Diagnostic test results will improve Outcome: Progressing Goal: Respiratory complications will improve Outcome: Progressing Goal: Cardiovascular complication will be avoided Outcome: Progressing   Problem: Activity: Goal: Risk for activity intolerance will decrease Outcome: Progressing   Problem: Nutrition: Goal: Adequate nutrition will be maintained Outcome: Progressing   Problem: Coping: Goal: Level of anxiety will decrease Outcome: Progressing   Problem: Elimination: Goal: Will not experience complications related to bowel motility Outcome: Progressing Goal: Will not experience complications related to urinary retention Outcome: Progressing   Problem: Pain Managment: Goal: General experience of comfort will improve Outcome: Progressing   Problem: Safety: Goal: Ability to remain free from injury will improve Outcome: Progressing   Problem: Skin Integrity: Goal: Risk for impaired skin integrity will decrease Outcome: Progressing   

## 2023-02-07 NOTE — Evaluation (Signed)
Physical Therapy Evaluation Patient Details Name: Adam Long MRN: [id number] DOB: October 10, 1975 Today's Date: 02/07/2023  History of Present Illness  47 y/o M admitted to [facility] on 5/12 after hitting his recently revised L BKA, with complaints of bleeding and generalized weakness. Pt recently hospitalized 4/28-5/9 for his L BKA revision with Dr. Lajoyce Corners on 5/4. PMHx: anxiety, CAD, HTN, DM, mechanical aortic valve, on Coumadin and Lovenox.  Clinical Impression  Pt presents today functioning close to his baseline but with deficits in activity tolerance and feeling fatigued. Pt reports feeling better compared to admission but not back to his baseline, which is modI for mobility with RW or wheelchair, use of prosthesis before L BKA revision. Pt required minG for transfer and brief hopping in the room for safety, but no overt LOB noted. Pt declined sitting in recliner or further ambulation due to mild fatigue, vitals stable. Acute PT will continue to follow up with pt during admission to progress mobility, recommend return home upon discharge, anticipate no further PT needs at discharge but will continue to assess.      Recommendations for follow up therapy are one component of a multi-disciplinary discharge planning process, led by the attending physician.  Recommendations may be updated based on patient status, additional functional criteria and insurance authorization.  Follow Up Recommendations       Assistance Recommended at Discharge Intermittent Supervision/Assistance  Patient can return home with the following  Assistance with cooking/housework;Assist for transportation;Help with stairs or ramp for entrance    Equipment Recommendations None recommended by PT  Recommendations for Other Services       Functional Status Assessment Patient has had a recent decline in their functional status and demonstrates the ability to make significant improvements in function in a reasonable and predictable  amount of time.     Precautions / Restrictions Precautions Precautions: Fall;Other (comment) Precaution Comments: recent L BKA revision Restrictions Weight Bearing Restrictions: Yes LLE Weight Bearing: Non weight bearing Other Position/Activity Restrictions: L BKA      Mobility  Bed Mobility Overal bed mobility: Needs Assistance Bed Mobility: Supine to Sit     Supine to sit: Supervision, HOB elevated     General bed mobility comments: mildly increased time, pt left EOB with NT for bath    Transfers Overall transfer level: Needs assistance Equipment used: Rolling walker (2 wheels) Transfers: Sit to/from Stand Sit to Stand: Min guard           General transfer comment: minG for safety, cueing for pushing up from the bed    Ambulation/Gait Ambulation/Gait assistance: Min guard Gait Distance (Feet): 3 Feet Assistive device: Rolling walker (2 wheels) Gait Pattern/deviations: Decreased stride length Gait velocity: decreased     General Gait Details: hopped alongside EOB, pt performing well with minG for safety. Pt declining ambulating further today and requesting to return to bed  Stairs            Wheelchair Mobility    Modified Rankin (Stroke Patients Only)       Balance Overall balance assessment: Needs assistance Sitting-balance support: Feet supported, No upper extremity supported Sitting balance-Leahy Scale: Good     Standing balance support: Bilateral upper extremity supported, Reliant on assistive device for balance, During functional activity Standing balance-Leahy Scale: Poor Standing balance comment: reliant on RW but good balance with mobility  Pertinent Vitals/Pain Pain Assessment Pain Assessment: Faces Faces Pain Scale: Hurts a little bit Pain Location: LLE Pain Descriptors / Indicators: Guarding Pain Intervention(s): Monitored during session, Repositioned    Home Living Family/patient  expects to be discharged to:: Private residence Living Arrangements: Other relatives Available Help at Discharge: Family;Friend(s);Available PRN/intermittently Type of Home: House Home Access: Stairs to enter Entrance Stairs-Rails: None Entrance Stairs-Number of Steps: 2 short steps   Home Layout: Two level;Able to live on main level with bedroom/bathroom Home Equipment: Rolling Walker (2 wheels);BSC/3in1;Wheelchair - manual Additional Comments: uncle    Prior Function Prior Level of Function : Independent/Modified Independent             Mobility Comments: has new prosthesis but can't use with wound, able to mobilize with RW or wheelchair, doing well with STE at home ADLs Comments: independent     Hand Dominance   Dominant Hand: Right    Extremity/Trunk Assessment   Upper Extremity Assessment Upper Extremity Assessment: Overall WFL for tasks assessed    Lower Extremity Assessment Lower Extremity Assessment: LLE deficits/detail LLE Deficits / Details: RLE grossly WFL, LLE able to move against gravity for hip flexion, knee extension/flexion, no resistance applied due to wound in place, sensation intact    Cervical / Trunk Assessment Cervical / Trunk Assessment: Other exceptions Cervical / Trunk Exceptions: increased body habitus  Communication   Communication: No difficulties  Cognition Arousal/Alertness: Awake/alert Behavior During Therapy: WFL for tasks assessed/performed Overall Cognitive Status: Within Functional Limits for tasks assessed                                 General Comments: A&Ox4, pleasant throughout session        General Comments General comments (skin integrity, edema, etc.): VSS on RA, pt denying dizziness with sitting and standing, BP stable    Exercises     Assessment/Plan    PT Assessment Patient needs continued PT services  PT Problem List Decreased activity tolerance;Decreased mobility;Decreased knowledge of  precautions       PT Treatment Interventions DME instruction;Gait training;Stair training;Functional mobility training;Therapeutic activities;Therapeutic exercise;Balance training;Neuromuscular re-education;Patient/family education    PT Goals (Current goals can be found in the Care Plan section)  Acute Rehab PT Goals Patient Stated Goal: go home PT Goal Formulation: With patient Time For Goal Achievement: 02/21/23 Potential to Achieve Goals: Good    Frequency Min 3X/week     Co-evaluation               AM-PAC PT "6 Clicks" Mobility  Outcome Measure Help needed turning from your back to your side while in a flat bed without using bedrails?: A Little Help needed moving from lying on your back to sitting on the side of a flat bed without using bedrails?: A Little Help needed moving to and from a bed to a chair (including a wheelchair)?: A Little Help needed standing up from a chair using your arms (e.g., wheelchair or bedside chair)?: A Little Help needed to walk in hospital room?: A Lot Help needed climbing 3-5 steps with a railing? : A Lot 6 Click Score: 16    End of Session Equipment Utilized During Treatment: Gait belt Activity Tolerance: Patient tolerated treatment well Patient left: with call bell/phone within reach;in bed;with bed alarm set Nurse Communication: Mobility status PT Visit Diagnosis: Difficulty in walking, not elsewhere classified (R26.2)    Time: 1610-9604 PT Time Calculation (min) (ACUTE  ONLY): 18 min   Charges:   PT Evaluation $PT Eval Low Complexity: 1 Low          Lindalou Hose, PT DPT Acute Rehabilitation Services Office [phone]   Leonie Man 02/07/2023, 4:13 PM

## 2023-02-08 DIAGNOSIS — D62 Acute posthemorrhagic anemia: Secondary | ICD-10-CM | POA: Diagnosis not present

## 2023-02-08 LAB — TROPONIN I (HIGH SENSITIVITY): Troponin I (High Sensitivity): 23 ng/L — ABNORMAL HIGH (ref <18)

## 2023-02-08 LAB — TYPE AND SCREEN
Unit division: 0
Unit division: 0

## 2023-02-08 LAB — BPAM RBC
Blood Product Expiration Date: 202406022359
Blood Product Expiration Date: 202406122359
Blood Product Expiration Date: 202406122359
ISSUE DATE / TIME: 202405121635
ISSUE DATE / TIME: 202405131108
ISSUE DATE / TIME: 202405132012
Unit Type and Rh: 6200
Unit Type and Rh: 6200
Unit Type and Rh: 6200
Unit Type and Rh: 6200

## 2023-02-08 LAB — GLUCOSE, CAPILLARY
Glucose-Capillary: 119 mg/dL — ABNORMAL HIGH (ref 70–99)
Glucose-Capillary: 186 mg/dL — ABNORMAL HIGH (ref 70–99)
Glucose-Capillary: 160 mg/dL — ABNORMAL HIGH (ref 70–99)
Glucose-Capillary: 141 mg/dL — ABNORMAL HIGH (ref 70–99)

## 2023-02-08 LAB — CBC WITH DIFFERENTIAL/PLATELET
Abs Immature Granulocytes: 0.15 10*3/uL — ABNORMAL HIGH (ref 0.00–0.07)
Basophils Absolute: 0.1 10*3/uL (ref 0.0–0.1)
Basophils Relative: 1 %
Eosinophils Absolute: 0.2 10*3/uL (ref 0.0–0.5)
Eosinophils Relative: 2 %
HCT: 25.1 % — ABNORMAL LOW (ref 39.0–52.0)
Hemoglobin: 8.3 g/dL — ABNORMAL LOW (ref 13.0–17.0)
Immature Granulocytes: 2 %
Lymphocytes Relative: 23 %
Lymphs Abs: 2 10*3/uL (ref 0.7–4.0)
MCH: 28.9 pg (ref 26.0–34.0)
MCHC: 33.1 g/dL (ref 30.0–36.0)
MCV: 87.5 fL (ref 80.0–100.0)
Monocytes Absolute: 0.6 10*3/uL (ref 0.1–1.0)
Monocytes Relative: 7 %
Neutro Abs: 5.6 10*3/uL (ref 1.7–7.7)
Neutrophils Relative %: 65 %
Platelets: 223 10*3/uL (ref 150–400)
RBC: 2.87 MIL/uL — ABNORMAL LOW (ref 4.22–5.81)
RDW: 14.6 % (ref 11.5–15.5)
WBC: 8.5 10*3/uL (ref 4.0–10.5)
nRBC: 0.9 % — ABNORMAL HIGH (ref 0.0–0.2)

## 2023-02-08 LAB — BASIC METABOLIC PANEL
Anion gap: 6 (ref 5–15)
BUN: 20 mg/dL (ref 6–20)
CO2: 24 mmol/L (ref 22–32)
Calcium: 7.8 mg/dL — ABNORMAL LOW (ref 8.9–10.3)
Chloride: 105 mmol/L (ref 98–111)
Creatinine, Ser: 1.08 mg/dL (ref 0.61–1.24)
GFR, Estimated: >60 mL/min (ref >60)
Glucose, Bld: 142 mg/dL — ABNORMAL HIGH (ref 70–99)
Potassium: 3.9 mmol/L (ref 3.5–5.1)
Sodium: 135 mmol/L (ref 135–145)

## 2023-02-08 LAB — MAGNESIUM: Magnesium: 2 mg/dL (ref 1.7–2.4)

## 2023-02-08 LAB — CK: Total CK: 31 U/L — ABNORMAL LOW (ref 49–397)

## 2023-02-08 LAB — BRAIN NATRIURETIC PEPTIDE: B Natriuretic Peptide: 449.6 pg/mL — ABNORMAL HIGH (ref 0.0–100.0)

## 2023-02-08 LAB — UREA NITROGEN, URINE: Urea Nitrogen, Ur: 981 mg/dL

## 2023-02-08 MED ORDER — POLYETHYLENE GLYCOL 3350 17 G PO PACK
17.0000 g | PACK | Freq: Two times a day (BID) | ORAL | Status: DC
  Administered 2023-02-08 – 2023-02-15 (×3): 17 g via ORAL
  Filled 2023-02-08 (×12): qty 1

## 2023-02-08 MED ORDER — DOCUSATE SODIUM 100 MG PO CAPS
200.0000 mg | ORAL_CAPSULE | Freq: Two times a day (BID) | ORAL | Status: DC
  Administered 2023-02-08 – 2023-02-15 (×4): 200 mg via ORAL
  Filled 2023-02-08 (×14): qty 2

## 2023-02-08 MED ORDER — AMLODIPINE BESYLATE 10 MG PO TABS
10.0000 mg | ORAL_TABLET | Freq: Every day | ORAL | Status: DC
  Administered 2023-02-08 – 2023-02-17 (×10): 10 mg via ORAL
  Filled 2023-02-08 (×10): qty 1

## 2023-02-08 NOTE — Care Management (Addendum)
  Transition of Care [facility]) Screening Note   Patient Details  Name: Adam Long Date of Birth: 09-27-1976   Transition of Care [facility]) CM/SW Contact:    Lawerance Sabal, RN Phone Number: 02/08/2023, 3:56 PM    Transition of Care Department [facility]) has reviewed patient and we will continue to monitor patient advancement through interdisciplinary progression rounds.     [facility] for home Daptomycin,  Liaison# [phone], Odis Hollingshead with [facility] as well  Notify Fleet Contras prior to DC

## 2023-02-08 NOTE — Progress Notes (Signed)
ANTICOAGULATION CONSULT NOTE --follow up  Pharmacy Consult for Heparin Indication:  mechanical valve; holding warfarin/enoxaparin given bleeding  No Known Allergies  Patient Measurements: Height: 5\' 10"  (177.8 cm) Weight: (!) 165.6 kg (365 lb) IBW/kg (Calculated) : 73 Heparin Dosing Weight: 113.5 kg  Vital Signs: Temp: 98 F (36.7 C) (05/14 1131) BP: 143/83 (05/14 1131) Pulse Rate: 79 (05/14 0814)  Labs: Recent Labs    02/06/23 1110 02/07/23 0335 02/07/23 1006 02/07/23 1838 02/08/23 0426  HGB 4.4* 5.2* 7.0* 6.8* 8.3*  HCT 14.9* 16.4* 21.2* 20.9* 25.1*  PLT 347 258 312 240 223  APTT 38*  --   --   --   --   LABPROT 18.4*  --   --   --   --   INR 1.5*  --   --   --   --   CREATININE 2.38* 1.62*  --   --  1.08  CKTOTAL  --  44*  --   --  31*  TROPONINIHS  --   --   --   --  23*     Estimated Creatinine Clearance: 133 mL/min (by C-G formula based on SCr of 1.08 mg/dL).   Medical History: Past Medical History:  Diagnosis Date   Anxiety    Aortic stenosis    Cellulitis and abscess of lower extremity 06/11/2019   Cellulitis of fourth toe of left foot    Cholelithiasis    Coronary artery disease    Nonobstructive CAD (40-50% LAD) 08/2019   Depression    Diabetes mellitus without complication (HCC)    Phreesia 09/27/2020   Elevated troponin level not due myocardial infarction 11/11/2019   Essential hypertension    Gangrene of toe of left foot (HCC) 07/06/2019   Heart murmur    Phreesia 09/27/2020   Hyperlipidemia    Phreesia 09/27/2020   Hypertension    Phreesia 09/27/2020   Mixed hyperlipidemia    Morbid obesity (HCC)    S/P aortic valve replacement with mechanical valve 12/05/2019   25 mm Carbomedics top hat bileaflet mechanical valve via partial upper hemi-sternotomy   Severe aortic stenosis 09/24/2019   Type 2 diabetes mellitus (HCC)     Medications:  Medications Prior to Admission  Medication Sig Dispense Refill Last Dose   daptomycin (CUBICIN)  IVPB Inject 900 mg into the vein daily. Indication:  BKA site infection  First Dose: Yes Last Day of Therapy:  03/25/23  Labs - Once weekly:  CBC/D, BMP, and CPK Labs - Every other week:  ESR and CRP Method of administration: IV Push Method of administration may be changed at the discretion of home infusion pharmacist based upon assessment of the patient and/or caregiver's ability to self-administer the medication ordered. 52 Units 0 02/05/2023   ezetimibe (ZETIA) 10 MG tablet Take 1 tablet (10 mg total) by mouth daily. 90 tablet 3 02/05/2023   gabapentin (NEURONTIN) 300 MG capsule Take 2 capsules (600 mg total) by mouth 2 (two) times daily. 360 capsule 3 02/05/2023   insulin glargine (LANTUS) 100 UNIT/ML Solostar Pen Inject 30 Units into the skin daily. 15 mL 11 02/05/2023   lisinopril (ZESTRIL) 20 MG tablet Take 1 tablet (20 mg total) by mouth daily. 90 tablet 3 02/05/2023   metFORMIN (GLUCOPHAGE) 500 MG tablet Take 2 tablets (1,000 mg total) by mouth 2 (two) times daily with a meal. 360 tablet 3 02/05/2023   methocarbamol (ROBAXIN) 500 MG tablet Take 1 tablet (500 mg total) by mouth every 6 (six)  hours as needed for muscle spasms. 30 tablet 0 unknown   metoprolol tartrate (LOPRESSOR) 25 MG tablet Take 1 tablet (25 mg total) by mouth 2 (two) times daily. 180 tablet 3 02/05/2023 at 1800   oxyCODONE (OXY IR/ROXICODONE) 5 MG immediate release tablet Take 1-2 tablets (5-10 mg total) by mouth every 4 (four) hours as needed for moderate pain (pain score 4-6). 30 tablet 0 Past Week   Oxycodone HCl 10 MG TABS Take 1-1.5 tablets (10-15 mg total) by mouth every 4 (four) hours as needed for severe pain (pain score 7-10). 30 tablet 0 unknown   pantoprazole (PROTONIX) 40 MG tablet Take 1 tablet (40 mg total) by mouth daily. 90 tablet 3 02/05/2023   sertraline (ZOLOFT) 50 MG tablet Take 1 tablet (50 mg total) by mouth daily. 90 tablet 3 02/05/2023   warfarin (COUMADIN) 10 MG tablet Take 1 tablet daily except 1/2 tablet  on Mondays, Wednesdays and Fridays or as directed (Patient taking differently: Take 5-10 mg by mouth See admin instructions. Take 10mg  by mouth daily except take 5mg  by mouth on Mondays, Wednesdays and Fridays.) 90 tablet 3 02/05/2023 at 1800   Zinc Sulfate 220 (50 Zn) MG TABS Take 1 tablet (220 mg total) by mouth daily. 100 tablet 0 Past Week   enoxaparin (LOVENOX) 100 MG/ML injection Inject 1 mL (100 mg total) into the skin 2 (two) times daily for 10 days. *Use with the 60mg /0.78mL syringe for a total of 160mg /dose* 20 mL 1    enoxaparin (LOVENOX) 60 MG/0.6ML injection Inject 0.6 mLs (60 mg total) into the skin 2 (two) times daily for 10 days. *Use with the 100mg /mL syringe for a total of 160mg /dose* 12 mL 1    Insulin Pen Needle 32G X 4 MM MISC Use with Lantus flexpen 100 each 0     Scheduled:   amLODipine  10 mg Oral Daily   Chlorhexidine Gluconate Cloth  6 each Topical Daily   docusate sodium  200 mg Oral BID   ezetimibe  10 mg Oral Daily   insulin aspart  0-15 Units Subcutaneous TID WC   insulin aspart  0-5 Units Subcutaneous QHS   insulin aspart  3 Units Subcutaneous TID WC   insulin detemir  12 Units Subcutaneous BID   metoprolol tartrate  25 mg Oral BID   pantoprazole  40 mg Oral Daily   polyethylene glycol  17 g Oral BID   sertraline  50 mg Oral Daily   Infusions:   DAPTOmycin (CUBICIN) 900 mg in sodium chloride 0.9 % IVPB Stopped (02/07/23 2230)   PRN: acetaminophen **OR** acetaminophen, methocarbamol, ondansetron **OR** ondansetron (ZOFRAN) IV, oxyCODONE, sodium chloride flush  Assessment: 46 yom with a history of T2DM, CAD, HTN, AVR, dyslipidemia. Recent admission with left BKA stump abscess s/p surgical I&D. Discharged from that encounter with enoxaparin/warfarin bridge. Patient is presenting with wound check / bleeding. Heparin per pharmacy (to start 5/13am) consult placed for  mechanical AVR in the setting of holding warfarin/enoxaparin given wound site bleeding .  Home  warfarin regimen is 10mg  T/R/Sa/Su and 5mg  on MWF and was bridging with 160 mg q12h SQ enoxaparin. Patient reports last warfarin dose 5/10 and last enoxaparin dose 5/11am.  PT/INR 18.4/1.5 on 02/06/2023  IV heparin remains on hold due to bleeding. Hgb 4.4>5.2> 1 ut PRBC (5/13)>7.0>6.8> 1ut PRBCs(5/13)> 8.3   plt 300s-200s stable.    5/14 RN reports patient is still having minimal bleeding to his stump when I did his dressing change! also  had a tiny tiny amount of bleeding from his rectum from hemorrhoids .  This was discussed with Dr. Thedore Mins who ordered to keep IV heparin on hold until 5/15. Will follow up with MD on 5/15 for plan for heparin.     Goal of Therapy:  Heparin level 0.3-0.5 units/ml Monitor platelets by anticoagulation protocol: Yes   Plan:  Hold on restart of heparin til 5/15, if no bleeding or improvement. Follow up with MD on plan  in AM.  Warfarin remains on hold.   When able to resume heparin our plan is to restart a lower rate than patient required last admission and adjust cautiously. Will use lower goal 0.3-0.5 initially giving hgb and concern for bleeding. Recommend adjusting to higher goal when appropriate pending plan for anticoagulation moving forward and response to UFH. Notably, lower target goal was also utilized previous admission.  Thank you for allowing pharmacy to be part of this patients care team.  Noah Delaine, RPh Clinical Pharmacist 02/08/2023 ED Clinical Pharmacist -  [phone]

## 2023-02-08 NOTE — Progress Notes (Signed)
PROGRESS NOTE                                                                                                                                                                                                             Patient Demographics:    Adam Long, is a 47 y.o. male, DOB - Sep 04, 1976, ZOX:[id number]  Outpatient Primary MD for the patient is [name], DO    LOS - 2  Admit date - 02/06/2023    Chief Complaint  Patient presents with   Post-op Problem       Brief Narrative (HPI from H&P)   47 y.o. male past medical history significant for anxiety coronary artery disease, essential hypertension diabetes mellitus type 2 mechanical aortic valve currently on Coumadin and Lovenox overlap, recently discharged from the hospital on 02/03/2023 for left BKA MRSA stump requiring revision (revision done by Dr. Lajoyce Corners on 01/29/2023, initial BKA was in 2022) surgical cultures confirm or say at home he was sent home on daptomycin with end of date 03/25/2023.   He went home and bumped his left BKA stump by mistake in his kitchen, subsequently started bleeding from the site came to the ER where he was diagnosed with significant blood loss related anemia and AKI and admitted to the hospital.   Subjective:   Patient in bed, appears comfortable, denies any headache, no fever, no chest pain or pressure, no shortness of breath , no abdominal pain. No new focal weakness.   Assessment  & Plan :    Acute blood loss anemia at the left BKA stump site due to mechanical injury in the setting of Coumadin and Lovenox overlap: He had massive bleed, total 5 units of packed RBC given last on 02/07/2023 afternoon, he has been seen by orthopedics on board, continue to monitor CBC, bleeding seems to have stopped, H&H stable this morning will resume nonbullous heparin drip with caution on 02/08/2023.   Acute kidney injury: Likely prerenal in the setting of  ongoing ACE inhibitor use and severe anemia, transfuse, hold ACE, renal function has stabilized.  Hyponatremia: Likely pseudohyponatremia and a component of hypovolemia. Monitor clinically.  Check renal electrolytes.   Mechanical aortic valve repair in 2021: Hold Coumadin and Lovenox. His INR goal is 2-3, zooming heparin drip without bolus with caution on 02/07/2022.   S/P BKA (below  knee amputation), left (HCC)/with history of left BKA MR stent stump abscess status post revision by due to on 01/29/2023, original left BKA was in 2022. Dr. Lajoyce Corners on board, continue wound care, his original left BKA was in 2022 followed by left BKA stump MRSA infection requiring revision by Dr. Lajoyce Corners on 01/29/2023, continue IV daptomycin with end date of 03/25/2023 > R Arm PICC.   Leukocytosis: Likely reactive likely due to severe acute blood loss, continue to monitor   Essential HTN (hypertension) Continue metoprolol, due to recent renal failure holding ACE inhibitor will add Norvasc instead.   Hyperlipidemia associated with type 2 diabetes mellitus (HCC) Continue statins.   Coronary artery disease: No anginal symptoms continue statins.   Anxiety: Continue Zoloft.   GERD: Continue Protonix.   Uncontrolled type 2 diabetes mellitus with hyperglycemia (HCC): Will allow him a diet, continue his long-acting insulin plus sensitive sliding scale, poor outpt control  Lab Results  Component Value Date   HGBA1C 13.4 (H) 01/24/2023   CBG (last 3)  Recent Labs    02/07/23 1820 02/07/23 2147 02/08/23 0807  GLUCAP 141* 135* 119*         Condition - Extremely Guarded  Family Communication  :  None  Code Status :  Full  Consults  :  Ortho  PUD Prophylaxis : PPI   Procedures  :            Disposition Plan  :    Status is: Inpatient   DVT Prophylaxis  :  SCD R Leg  Place and maintain sequential compression device Start: 02/07/23 0929 Lab Results  Component Value Date   PLT 223  02/08/2023    Diet :  Diet Order             Diet Carb Modified Fluid consistency: Thin; Room service appropriate? Yes  Diet effective now                    Inpatient Medications  Scheduled Meds:  Chlorhexidine Gluconate Cloth  6 each Topical Daily   ezetimibe  10 mg Oral Daily   insulin aspart  0-15 Units Subcutaneous TID WC   insulin aspart  0-5 Units Subcutaneous QHS   insulin aspart  3 Units Subcutaneous TID WC   insulin detemir  12 Units Subcutaneous BID   metoprolol tartrate  25 mg Oral BID   pantoprazole  40 mg Oral Daily   sertraline  50 mg Oral Daily   Continuous Infusions:  DAPTOmycin (CUBICIN) 900 mg in sodium chloride 0.9 % IVPB Stopped (02/07/23 2230)   PRN Meds:.acetaminophen **OR** acetaminophen, methocarbamol, ondansetron **OR** ondansetron (ZOFRAN) IV, oxyCODONE, sodium chloride flush    Objective:   Vitals:   02/08/23 0800 02/08/23 0807 02/08/23 0814 02/08/23 0900  BP: 135/78   (!) 146/78  Pulse: 72  79   Resp: 17   13  Temp:  98 F (36.7 C)    TempSrc:      SpO2: 100%  100%   Weight:      Height:        Wt Readings from Last 3 Encounters:  02/06/23 (!) 165.6 kg  02/02/23 (!) 168.1 kg  11/09/22 (!) 173.3 kg     Intake/Output Summary (Last 24 hours) at 02/08/2023 1022 Last data filed at 02/08/2023 0920 Gross per 24 hour  Intake 785.5 ml  Output 3300 ml  Net -2514.5 ml     Physical Exam  Awake Alert, No new F.N deficits, Normal  affect [place].AT,PERRAL Supple Neck, No JVD,   Symmetrical Chest wall movement, Good air movement bilaterally, CTAB RRR,No Gallops,Rubs or new Murmurs,  +ve B.Sounds, Abd Soft, No tenderness,   L BKA stump under pressure bandage, R Arm PICC.   Data Review:    Recent Labs  Lab 02/06/23 1110 02/07/23 0335 02/07/23 1006 02/07/23 1838 02/08/23 0426  WBC 16.2* 11.2* 12.9* 9.9 8.5  HGB 4.4* 5.2* 7.0* 6.8* 8.3*  HCT 14.9* 16.4* 21.2* 20.9* 25.1*  PLT 347 258 312 240 223  MCV 89.8 86.8 87.6 87.8 87.5   MCH 26.5 27.5 28.9 28.6 28.9  MCHC 29.5* 31.7 33.0 32.5 33.1  RDW 14.2 14.2 14.3 14.4 14.6  LYMPHSABS 1.4  --   --   --  2.0  MONOABS 0.6  --   --   --  0.6  EOSABS 0.0  --   --   --  0.2  BASOSABS 0.0  --   --   --  0.1    Recent Labs  Lab 02/02/23 0433 02/03/23 0521 02/06/23 1110 02/07/23 0335 02/08/23 0426  NA 133*  --  131* 130* 135  K 3.5  --  3.9 3.7 3.9  CL 103  --  97* 99 105  CO2 25  --  17* 24 24  ANIONGAP 5  --  17* 7 6  GLUCOSE 168*  --  415* 174* 142*  BUN 10  --  33* 30* 20  CREATININE 0.77  --  2.38* 1.62* 1.08  AST  --   --  23  --   --   ALT  --   --  22  --   --   ALKPHOS  --   --  42  --   --   BILITOT  --   --  0.7  --   --   ALBUMIN  --   --  2.4*  --   --   LATICACIDVEN  --   --  5.5*  --   --   INR 1.2 1.3* 1.5*  --   --   BNP  --   --   --  182.2* 449.6*  MG  --   --   --  2.0 2.0  CALCIUM 8.2*  --  8.0* 7.5* 7.8*      Recent Labs  Lab 02/02/23 0433 02/03/23 0521 02/06/23 1110 02/07/23 0335 02/08/23 0426  LATICACIDVEN  --   --  5.5*  --   --   INR 1.2 1.3* 1.5*  --   --   BNP  --   --   --  182.2* 449.6*  MG  --   --   --  2.0 2.0  CALCIUM 8.2*  --  8.0* 7.5* 7.8*   Micro Results No results found for this or any previous visit (from the past 240 hour(s)).  Radiology Reports DG Chest Port 1 View  Result Date: 02/06/2023 CLINICAL DATA:  Questionable sepsis. EXAM: PORTABLE CHEST 1 VIEW COMPARISON:  January 01, 2020 FINDINGS: A right PICC line terminates in the central SVC. The heart, hila, mediastinum, lungs, and pleura are otherwise normal. IMPRESSION: No active disease. Electronically Signed   By: Gerome Sam III M.D.   On: 02/06/2023 12:35      Signature  -   Susa Raring M.D on 02/08/2023 at 10:22 AM   -  To page go to [email or web address]

## 2023-02-09 DIAGNOSIS — D62 Acute posthemorrhagic anemia: Secondary | ICD-10-CM | POA: Diagnosis not present

## 2023-02-09 DIAGNOSIS — Z89512 Acquired absence of left leg below knee: Secondary | ICD-10-CM | POA: Diagnosis not present

## 2023-02-09 LAB — HEPARIN LEVEL (UNFRACTIONATED)
Heparin Unfractionated: 0.29 IU/mL — ABNORMAL LOW (ref 0.30–0.70)
Heparin Unfractionated: 0.35 IU/mL (ref 0.30–0.70)

## 2023-02-09 LAB — CBC WITH DIFFERENTIAL/PLATELET
Abs Immature Granulocytes: 0.11 10*3/uL — ABNORMAL HIGH (ref 0.00–0.07)
Basophils Absolute: 0 10*3/uL (ref 0.0–0.1)
Basophils Relative: 1 %
Eosinophils Absolute: 0.2 10*3/uL (ref 0.0–0.5)
Eosinophils Relative: 3 %
HCT: 24.3 % — ABNORMAL LOW (ref 39.0–52.0)
Hemoglobin: 7.7 g/dL — ABNORMAL LOW (ref 13.0–17.0)
Immature Granulocytes: 2 %
Lymphocytes Relative: 24 %
Lymphs Abs: 1.6 10*3/uL (ref 0.7–4.0)
MCH: 27.5 pg (ref 26.0–34.0)
MCHC: 31.7 g/dL (ref 30.0–36.0)
MCV: 86.8 fL (ref 80.0–100.0)
Monocytes Absolute: 0.5 10*3/uL (ref 0.1–1.0)
Monocytes Relative: 7 %
Neutro Abs: 4.2 10*3/uL (ref 1.7–7.7)
Neutrophils Relative %: 63 %
Platelets: 211 10*3/uL (ref 150–400)
RBC: 2.8 MIL/uL — ABNORMAL LOW (ref 4.22–5.81)
RDW: 14.5 % (ref 11.5–15.5)
WBC: 6.6 10*3/uL (ref 4.0–10.5)
nRBC: 0.6 % — ABNORMAL HIGH (ref 0.0–0.2)

## 2023-02-09 LAB — GLUCOSE, CAPILLARY
Glucose-Capillary: 152 mg/dL — ABNORMAL HIGH (ref 70–99)
Glucose-Capillary: 135 mg/dL — ABNORMAL HIGH (ref 70–99)
Glucose-Capillary: 215 mg/dL — ABNORMAL HIGH (ref 70–99)
Glucose-Capillary: 198 mg/dL — ABNORMAL HIGH (ref 70–99)

## 2023-02-09 LAB — BASIC METABOLIC PANEL
Anion gap: 5 (ref 5–15)
BUN: 12 mg/dL (ref 6–20)
CO2: 25 mmol/L (ref 22–32)
Calcium: 7.8 mg/dL — ABNORMAL LOW (ref 8.9–10.3)
Chloride: 105 mmol/L (ref 98–111)
Creatinine, Ser: 0.97 mg/dL (ref 0.61–1.24)
GFR, Estimated: >60 mL/min (ref >60)
Glucose, Bld: 185 mg/dL — ABNORMAL HIGH (ref 70–99)
Potassium: 3.8 mmol/L (ref 3.5–5.1)
Sodium: 135 mmol/L (ref 135–145)

## 2023-02-09 LAB — CULTURE, BLOOD (ROUTINE X 2)

## 2023-02-09 LAB — HEMOGLOBIN: Hemoglobin: 8.3 g/dL — ABNORMAL LOW (ref 13.0–17.0)

## 2023-02-09 LAB — MAGNESIUM: Magnesium: 2.1 mg/dL (ref 1.7–2.4)

## 2023-02-09 LAB — BRAIN NATRIURETIC PEPTIDE: B Natriuretic Peptide: 491.6 pg/mL — ABNORMAL HIGH (ref 0.0–100.0)

## 2023-02-09 MED ORDER — HEPARIN (PORCINE) 25000 UT/250ML-% IV SOLN
2700.0000 [IU]/h | INTRAVENOUS | Status: AC
  Administered 2023-02-09 – 2023-02-12 (×7): 2500 [IU]/h via INTRAVENOUS
  Administered 2023-02-12 – 2023-02-16 (×8): 2700 [IU]/h via INTRAVENOUS
  Filled 2023-02-09 (×17): qty 250

## 2023-02-09 NOTE — Progress Notes (Signed)
PROGRESS NOTE                                                                                                                                                                                                             Patient Demographics:    Adam Long, is a 47 y.o. male, DOB - [birth year], RUE:[id number]  Outpatient Primary MD for the patient is [name], DO    LOS - 3  Admit date - 02/06/2023    Chief Complaint  Patient presents with   Post-op Problem       Brief Narrative (HPI from H&P)    47 y.o. male past medical history significant for anxiety coronary artery disease, essential hypertension diabetes mellitus type 2 mechanical aortic valve currently on Coumadin and Lovenox overlap, recently discharged from the hospital on 02/03/2023 for left BKA MRSA stump requiring revision (revision done by Dr. Lajoyce Corners on 01/29/2023, initial BKA was in 2022) surgical cultures confirm or say at home he was sent home on daptomycin with end of date 03/25/2023.   He went home and bumped his left BKA stump by mistake in his kitchen, subsequently started bleeding from the site came to the ER where he was diagnosed with significant blood loss related anemia and AKI and admitted to the hospital.   Subjective:   No acute events overnight as discussed with staff, he denies any fever, chills, joint pain, or bleed from stump site.    Assessment  & Plan :    Acute blood loss anemia at the left BKA stump site due to mechanical injury in the setting of Coumadin and Lovenox overlap: He had massive bleed, total 5 units of packed RBC given last on 02/07/2023 afternoon, he has been seen by orthopedics on board, continue to monitor CBC, bleeding seems to have stopped -Evidence of significant bleed at stump site, hemoglobin remained stable, will resume heparin drip low-dose today, if remains stable will resume back on warfarin in 1 to 2 days.   Acute  kidney injury: Likely prerenal in the setting of ongoing ACE inhibitor use and severe anemia, transfuse, hold ACE, renal function has stabilized.  Hyponatremia: Likely pseudohyponatremia and a component of hypovolemia. Monitor clinically.  Check renal electrolytes.   Mechanical aortic valve repair in 2021: Hold Coumadin and Lovenox. His INR goal is 2-3, zooming heparin drip  without bolus with caution on 02/07/2022.   S/P BKA (below knee amputation), left (HCC)/with history of left BKA MR stent stump abscess status post revision by due to on 01/29/2023, original left BKA was in 2022. Dr. Lajoyce Corners on board, continue wound care, his original left BKA was in 2022 followed by left BKA stump MRSA infection requiring revision by Dr. Lajoyce Corners on 01/29/2023, continue IV daptomycin with end date of 03/25/2023 > R Arm PICC.   Leukocytosis: Likely reactive likely due to severe acute blood loss, continue to monitor   Essential HTN (hypertension) Continue metoprolol, due to recent renal failure holding ACE inhibitor will add Norvasc instead.   Hyperlipidemia associated with type 2 diabetes mellitus (HCC) Continue statins.   Coronary artery disease: No anginal symptoms continue statins.   Anxiety: Continue Zoloft.   GERD: Continue Protonix.   Uncontrolled type 2 diabetes mellitus with hyperglycemia (HCC): Will allow him a diet, continue his long-acting insulin plus sensitive sliding scale, poor outpt control  Lab Results  Component Value Date   HGBA1C 13.4 (H) 01/24/2023   CBG (last 3)  Recent Labs    02/08/23 2134 02/09/23 0751 02/09/23 1134  GLUCAP 141* 152* 135*         Condition - Extremely Guarded  Family Communication  :  None  Code Status :  Full  Consults  :  Ortho  PUD Prophylaxis : PPI   Procedures  :            Disposition Plan  :    Status is: Inpatient   DVT Prophylaxis  :  SCD R Leg  Place and maintain sequential compression device Start: 02/07/23 0929 Lab  Results  Component Value Date   PLT 211 02/09/2023    Diet :  Diet Order             Diet Carb Modified Fluid consistency: Thin; Room service appropriate? Yes  Diet effective now                    Inpatient Medications  Scheduled Meds:  amLODipine  10 mg Oral Daily   Chlorhexidine Gluconate Cloth  6 each Topical Daily   docusate sodium  200 mg Oral BID   ezetimibe  10 mg Oral Daily   insulin aspart  0-15 Units Subcutaneous TID WC   insulin aspart  0-5 Units Subcutaneous QHS   insulin aspart  3 Units Subcutaneous TID WC   insulin detemir  12 Units Subcutaneous BID   metoprolol tartrate  25 mg Oral BID   pantoprazole  40 mg Oral Daily   polyethylene glycol  17 g Oral BID   sertraline  50 mg Oral Daily   Continuous Infusions:  DAPTOmycin (CUBICIN) 900 mg in sodium chloride 0.9 % IVPB Stopped (02/08/23 1957)   heparin 2,500 Units/hr (02/09/23 0949)   PRN Meds:.acetaminophen **OR** acetaminophen, methocarbamol, ondansetron **OR** ondansetron (ZOFRAN) IV, oxyCODONE, sodium chloride flush    Objective:   Vitals:   02/09/23 0400 02/09/23 0500 02/09/23 0752 02/09/23 1130  BP: 120/74 111/66 (!) 107/96 123/77  Pulse: 70  70 75  Resp: 16  16 17   Temp: 98 F (36.7 C)  98.9 F (37.2 C) 98 F (36.7 C)  TempSrc: Oral  Oral Oral  SpO2: 94%  95% 98%  Weight:      Height:        Wt Readings from Last 3 Encounters:  02/06/23 (!) 165.6 kg  02/02/23 (!) 168.1 kg  11/09/22 Marland Kitchen)  173.3 kg     Intake/Output Summary (Last 24 hours) at 02/09/2023 1357 Last data filed at 02/09/2023 0949 Gross per 24 hour  Intake 68 ml  Output 2880 ml  Net -2812 ml     Physical Exam  Awake Alert, Oriented X 3, No new F.N deficits, Normal affect Symmetrical Chest wall movement, Good air movement bilaterally, CTAB RRR,No Gallops,Rubs or new Murmurs, No Parasternal Heave +ve B.Sounds, Abd Soft, No tenderness, No rebound - guarding or rigidity. L BKA stump under pressure bandage, gauze has  been seen and examined with no evidence of bleed.  R Arm PICC.   Data Review:    Recent Labs  Lab 02/06/23 1110 02/07/23 0335 02/07/23 1006 02/07/23 1838 02/08/23 0426 02/09/23 0340  WBC 16.2* 11.2* 12.9* 9.9 8.5 6.6  HGB 4.4* 5.2* 7.0* 6.8* 8.3* 7.7*  HCT 14.9* 16.4* 21.2* 20.9* 25.1* 24.3*  PLT 347 258 312 240 223 211  MCV 89.8 86.8 87.6 87.8 87.5 86.8  MCH 26.5 27.5 28.9 28.6 28.9 27.5  MCHC 29.5* 31.7 33.0 32.5 33.1 31.7  RDW 14.2 14.2 14.3 14.4 14.6 14.5  LYMPHSABS 1.4  --   --   --  2.0 1.6  MONOABS 0.6  --   --   --  0.6 0.5  EOSABS 0.0  --   --   --  0.2 0.2  BASOSABS 0.0  --   --   --  0.1 0.0    Recent Labs  Lab 02/03/23 0521 02/06/23 1110 02/07/23 0335 02/08/23 0426 02/09/23 0340  NA  --  131* 130* 135 135  K  --  3.9 3.7 3.9 3.8  CL  --  97* 99 105 105  CO2  --  17* 24 24 25   ANIONGAP  --  17* 7 6 5   GLUCOSE  --  415* 174* 142* 185*  BUN  --  33* 30* 20 12  CREATININE  --  2.38* 1.62* 1.08 0.97  AST  --  23  --   --   --   ALT  --  22  --   --   --   ALKPHOS  --  42  --   --   --   BILITOT  --  0.7  --   --   --   ALBUMIN  --  2.4*  --   --   --   LATICACIDVEN  --  5.5*  --   --   --   INR 1.3* 1.5*  --   --   --   BNP  --   --  182.2* 449.6* 491.6*  MG  --   --  2.0 2.0 2.1  CALCIUM  --  8.0* 7.5* 7.8* 7.8*      Recent Labs  Lab 02/03/23 0521 02/06/23 1110 02/07/23 0335 02/08/23 0426 02/09/23 0340  LATICACIDVEN  --  5.5*  --   --   --   INR 1.3* 1.5*  --   --   --   BNP  --   --  182.2* 449.6* 491.6*  MG  --   --  2.0 2.0 2.1  CALCIUM  --  8.0* 7.5* 7.8* 7.8*   Micro Results Recent Results (from the past 240 hour(s))  Culture, blood (Routine X 2) w Reflex to ID Panel     Status: None (Preliminary result)   Collection Time: 02/08/23  4:27 AM   Specimen: BLOOD LEFT HAND  Result Value Ref Range Status  Specimen Description BLOOD LEFT HAND  Final   Special Requests   Final    BOTTLES DRAWN AEROBIC AND ANAEROBIC Blood Culture  adequate volume   Culture   Final    NO GROWTH 1 DAY Performed at [facility], [address] [place], [place] [address]    Report Status PENDING  Incomplete  Culture, blood (Routine X 2) w Reflex to ID Panel     Status: None (Preliminary result)   Collection Time: 02/08/23  4:29 AM   Specimen: BLOOD LEFT HAND  Result Value Ref Range Status   Specimen Description BLOOD LEFT HAND  Final   Special Requests   Final    BOTTLES DRAWN AEROBIC AND ANAEROBIC Blood Culture results may not be optimal due to an excessive volume of blood received in culture bottles   Culture   Final    NO GROWTH 1 DAY Performed at [facility], [address] [place], [place] [address]    Report Status PENDING  Incomplete    Radiology Reports DG Chest Port 1 View  Result Date: 02/06/2023 CLINICAL DATA:  Questionable sepsis. EXAM: PORTABLE CHEST 1 VIEW COMPARISON:  January 01, 2020 FINDINGS: A right PICC line terminates in the central SVC. The heart, hila, mediastinum, lungs, and pleura are otherwise normal. IMPRESSION: No active disease. Electronically Signed   By: Gerome Sam III M.D.   On: 02/06/2023 12:35      Signature  -   Huey Bienenstock M.D on 02/09/2023 at 1:57 PM   -  To page go to [email or web address]

## 2023-02-09 NOTE — Progress Notes (Signed)
ANTICOAGULATION CONSULT NOTE --follow up  Pharmacy Consult for Heparin Indication:  mechanical valve; holding warfarin/enoxaparin given bleeding  No Known Allergies  Patient Measurements: Height: 5\' 10"  (177.8 cm) Weight: (!) 165.6 kg (365 lb) IBW/kg (Calculated) : 73 Heparin Dosing Weight: 113.5 kg  Vital Signs: Temp: 98.9 F (37.2 C) (05/15 0752) Temp Source: Oral (05/15 0752) BP: 107/96 (05/15 0752) Pulse Rate: 70 (05/15 0752)  Labs: Recent Labs    02/06/23 1110 02/07/23 0335 02/07/23 1006 02/07/23 1838 02/08/23 0426 02/09/23 0340  HGB 4.4* 5.2*   < > 6.8* 8.3* 7.7*  HCT 14.9* 16.4*   < > 20.9* 25.1* 24.3*  PLT 347 258   < > 240 223 211  APTT 38*  --   --   --   --   --   LABPROT 18.4*  --   --   --   --   --   INR 1.5*  --   --   --   --   --   CREATININE 2.38* 1.62*  --   --  1.08 0.97  CKTOTAL  --  44*  --   --  31*  --   TROPONINIHS  --   --   --   --  23*  --    < > = values in this interval not displayed.     Estimated Creatinine Clearance: 148.1 mL/min (by C-G formula based on SCr of 0.97 mg/dL).   Medical History: Past Medical History:  Diagnosis Date   Anxiety    Aortic stenosis    Cellulitis and abscess of lower extremity 06/11/2019   Cellulitis of fourth toe of left foot    Cholelithiasis    Coronary artery disease    Nonobstructive CAD (40-50% LAD) 08/2019   Depression    Diabetes mellitus without complication (HCC)    Phreesia 09/27/2020   Elevated troponin level not due myocardial infarction 11/11/2019   Essential hypertension    Gangrene of toe of left foot (HCC) 07/06/2019   Heart murmur    Phreesia 09/27/2020   Hyperlipidemia    Phreesia 09/27/2020   Hypertension    Phreesia 09/27/2020   Mixed hyperlipidemia    Morbid obesity (HCC)    S/P aortic valve replacement with mechanical valve 12/05/2019   25 mm Carbomedics top hat bileaflet mechanical valve via partial upper hemi-sternotomy   Severe aortic stenosis 09/24/2019   Type  2 diabetes mellitus (HCC)     Medications:  Medications Prior to Admission  Medication Sig Dispense Refill Last Dose   daptomycin (CUBICIN) IVPB Inject 900 mg into the vein daily. Indication:  BKA site infection  First Dose: Yes Last Day of Therapy:  03/25/23  Labs - Once weekly:  CBC/D, BMP, and CPK Labs - Every other week:  ESR and CRP Method of administration: IV Push Method of administration may be changed at the discretion of home infusion pharmacist based upon assessment of the patient and/or caregiver's ability to self-administer the medication ordered. 52 Units 0 02/05/2023   ezetimibe (ZETIA) 10 MG tablet Take 1 tablet (10 mg total) by mouth daily. 90 tablet 3 02/05/2023   gabapentin (NEURONTIN) 300 MG capsule Take 2 capsules (600 mg total) by mouth 2 (two) times daily. 360 capsule 3 02/05/2023   insulin glargine (LANTUS) 100 UNIT/ML Solostar Pen Inject 30 Units into the skin daily. 15 mL 11 02/05/2023   lisinopril (ZESTRIL) 20 MG tablet Take 1 tablet (20 mg total) by mouth daily. 90 tablet  3 02/05/2023   metFORMIN (GLUCOPHAGE) 500 MG tablet Take 2 tablets (1,000 mg total) by mouth 2 (two) times daily with a meal. 360 tablet 3 02/05/2023   methocarbamol (ROBAXIN) 500 MG tablet Take 1 tablet (500 mg total) by mouth every 6 (six) hours as needed for muscle spasms. 30 tablet 0 unknown   metoprolol tartrate (LOPRESSOR) 25 MG tablet Take 1 tablet (25 mg total) by mouth 2 (two) times daily. 180 tablet 3 02/05/2023 at 1800   oxyCODONE (OXY IR/ROXICODONE) 5 MG immediate release tablet Take 1-2 tablets (5-10 mg total) by mouth every 4 (four) hours as needed for moderate pain (pain score 4-6). 30 tablet 0 Past Week   Oxycodone HCl 10 MG TABS Take 1-1.5 tablets (10-15 mg total) by mouth every 4 (four) hours as needed for severe pain (pain score 7-10). 30 tablet 0 unknown   pantoprazole (PROTONIX) 40 MG tablet Take 1 tablet (40 mg total) by mouth daily. 90 tablet 3 02/05/2023   sertraline (ZOLOFT) 50 MG  tablet Take 1 tablet (50 mg total) by mouth daily. 90 tablet 3 02/05/2023   warfarin (COUMADIN) 10 MG tablet Take 1 tablet daily except 1/2 tablet on Mondays, Wednesdays and Fridays or as directed (Patient taking differently: Take 5-10 mg by mouth See admin instructions. Take 10mg  by mouth daily except take 5mg  by mouth on Mondays, Wednesdays and Fridays.) 90 tablet 3 02/05/2023 at 1800   Zinc Sulfate 220 (50 Zn) MG TABS Take 1 tablet (220 mg total) by mouth daily. 100 tablet 0 Past Week   enoxaparin (LOVENOX) 100 MG/ML injection Inject 1 mL (100 mg total) into the skin 2 (two) times daily for 10 days. *Use with the 60mg /0.57mL syringe for a total of 160mg /dose* 20 mL 1    enoxaparin (LOVENOX) 60 MG/0.6ML injection Inject 0.6 mLs (60 mg total) into the skin 2 (two) times daily for 10 days. *Use with the 100mg /mL syringe for a total of 160mg /dose* 12 mL 1    Insulin Pen Needle 32G X 4 MM MISC Use with Lantus flexpen 100 each 0     Scheduled:   amLODipine  10 mg Oral Daily   Chlorhexidine Gluconate Cloth  6 each Topical Daily   docusate sodium  200 mg Oral BID   ezetimibe  10 mg Oral Daily   insulin aspart  0-15 Units Subcutaneous TID WC   insulin aspart  0-5 Units Subcutaneous QHS   insulin aspart  3 Units Subcutaneous TID WC   insulin detemir  12 Units Subcutaneous BID   metoprolol tartrate  25 mg Oral BID   pantoprazole  40 mg Oral Daily   polyethylene glycol  17 g Oral BID   sertraline  50 mg Oral Daily   Infusions:   DAPTOmycin (CUBICIN) 900 mg in sodium chloride 0.9 % IVPB Stopped (02/08/23 1957)   PRN: acetaminophen **OR** acetaminophen, methocarbamol, ondansetron **OR** ondansetron (ZOFRAN) IV, oxyCODONE, sodium chloride flush  Assessment: 46 yom with a history of T2DM, CAD, HTN, AVR, dyslipidemia. Recent admission with left BKA stump abscess s/p surgical I&D. Discharged from that encounter with enoxaparin/warfarin bridge. Patient is presenting with wound check / bleeding. Heparin per  pharmacy (to start 5/13am) consult placed for  mechanical AVR in the setting of holding warfarin/enoxaparin given wound site bleeding .  Home warfarin regimen is 10mg  T/R/Sa/Su and 5mg  on MWF and was bridging with 160 mg q12h SQ enoxaparin. Patient reports last warfarin dose 5/10 and last enoxaparin dose 5/11am.  PT/INR 18.4/1.5 on  02/06/2023  IV heparin to start when bleeding subsides and Hgb stable.  Hgb 4.4>5.2> 1 ut PRBC (5/13)>7.0>6.8> 1ut PRBCs(5/13)> 8.3   plt 300s-200s stable as of 02/08/2023/    5/14 RN reports patient is still having minimal bleeding to his stump when I did his dressing change! also had a tiny tiny amount of bleeding from his rectum from hemorrhoids .  This was discussed with Dr. Thedore Mins who ordered to keep IV heparin on hold until 5/15. Will follow up with MD on 5/15 for plan for heparin.   UPDATE:  5/15 Dr. Randol Kern said no bleeding from stump and gave order to restart IV heparin now.  Hgb 8.3> 7.7,  pltc wnl stable.  We will use lower HL goal 0.3-0.5 initially giving hgb and concern for bleeding. Recommend adjusting to higher goal when appropriate pending plan for anticoagulation moving forward and response to UFH. Notably, lower target goal was also utilized previous admission. Pharmacy plan for heparin rate based on:  ---previously therapeutic at 3350 units/hr but slightly above 0.5 goal ---conservatively we will start Heparin at 2500 units/hr given recent heparin level data ---would consider increasing starting rate vs change to enoxaparin in the AM pending hgb and bleeding status    Goal of Therapy:  Heparin level 0.3-0.5 units/ml Monitor platelets by anticoagulation protocol: Yes   Plan:  Warfarin remains on hold. Start heparin infusion at 2500 units/hr now. ----conservatively starting at 2500 units/hr given recent heparin level data as noted above. Check anti-Xa level  4  hours post infusion start per Dr. Doristine Church preference and discussed today with Dr.  Randol Kern Then 8hr HL Daily HL and CBC while on heparin Continue to monitor H&H and platelets   W Thank you for allowing pharmacy to be part of this patients care team.  Noah Delaine, RPh Clinical Pharmacist 02/09/2023 ED Clinical Pharmacist -  [phone]

## 2023-02-09 NOTE — Progress Notes (Signed)
Physical Therapy Treatment Patient Details Name: Adam Long MRN: [id number] DOB: 09-Sep-1976 Today's Date: 02/09/2023   History of Present Illness 47 y/o M admitted to [facility] on 5/12 after hitting his recently revised L BKA, with complaints of bleeding and generalized weakness. Pt recently hospitalized 4/28-5/9 for his L BKA revision with Dr. Lajoyce Corners on 5/4. PMHx: anxiety, CAD, HTN, DM, mechanical aortic valve, on Coumadin and Lovenox.    PT Comments    Pt tolerated today's session well, but remains limited by endurance. Pt reports feeling better today than last session, agreeable to ambulation in the room today, but only tolerating 8-10 feet before needing a seated rest break due to fatigue, VSS throughout. Pt mobilizing with minG and RW for transfers and ambulation, close guard provided for safety, balance, and line management, no overt LOB. Pt will continue to benefit from skilled acute PT to progress mobility and activity tolerance to return to his baseline, discharge recommendations remain appropriate but will continue to assess pending endurance next session.     Recommendations for follow up therapy are one component of a multi-disciplinary discharge planning process, led by the attending physician.  Recommendations may be updated based on patient status, additional functional criteria and insurance authorization.  Follow Up Recommendations       Assistance Recommended at Discharge Intermittent Supervision/Assistance  Patient can return home with the following Assistance with cooking/housework;Assist for transportation;Help with stairs or ramp for entrance;A little help with walking and/or transfers   Equipment Recommendations  None recommended by PT    Recommendations for Other Services       Precautions / Restrictions Precautions Precautions: Fall;Other (comment) Precaution Comments: recent L BKA revision Restrictions Weight Bearing Restrictions: Yes LLE Weight Bearing: Non  weight bearing Other Position/Activity Restrictions: L BKA     Mobility  Bed Mobility Overal bed mobility: Needs Assistance Bed Mobility: Supine to Sit, Sit to Supine     Supine to sit: Supervision, HOB elevated Sit to supine: Supervision, HOB elevated   General bed mobility comments: supervision for safety but pt managing without physical assist    Transfers Overall transfer level: Needs assistance Equipment used: Rolling walker (2 wheels) Transfers: Sit to/from Stand Sit to Stand: Min guard           General transfer comment: minG for safety and balance to stand from bed and from [facility]    Ambulation/Gait Ambulation/Gait assistance: Min guard Gait Distance (Feet): 8 Feet (10 feet after seated rest break) Assistive device: Rolling walker (2 wheels) Gait Pattern/deviations: Decreased stride length Gait velocity: decreased     General Gait Details: pt ambulating in the room with goal to the door but requiring a seated rest break on the BSC due to fatigue. After seated rest break, pt able to turn 180 degrees and ambulate back to the bed. Good balance and foot clearance with hopping but limited with endurance   Stairs             Wheelchair Mobility    Modified Rankin (Stroke Patients Only)       Balance Overall balance assessment: Needs assistance Sitting-balance support: Feet supported, No upper extremity supported Sitting balance-Leahy Scale: Good     Standing balance support: Bilateral upper extremity supported, Reliant on assistive device for balance, During functional activity Standing balance-Leahy Scale: Poor Standing balance comment: reliant on RW but good balance with mobility  Cognition Arousal/Alertness: Awake/alert Behavior During Therapy: WFL for tasks assessed/performed Overall Cognitive Status: Within Functional Limits for tasks assessed                                           Exercises      General Comments General comments (skin integrity, edema, etc.): VSS on room air. Upon sitting EOB, noted skin tear on RLE bleeding, RN notified and placing bandage prior to OOB. Upon return to sitting at EOB, noted ACE wrap had fallen off pt's L BKA, RN notified to re-apply dressing      Pertinent Vitals/Pain Pain Assessment Pain Assessment: Faces Faces Pain Scale: Hurts a little bit Pain Location: LLE Pain Descriptors / Indicators: Aching Pain Intervention(s): Monitored during session, Repositioned    Home Living                          Prior Function            PT Goals (current goals can now be found in the care plan section) Acute Rehab PT Goals Patient Stated Goal: go home PT Goal Formulation: With patient Time For Goal Achievement: 02/21/23 Potential to Achieve Goals: Good Progress towards PT goals: Progressing toward goals    Frequency    Min 3X/week      PT Plan Current plan remains appropriate    Co-evaluation              AM-PAC PT "6 Clicks" Mobility   Outcome Measure  Help needed turning from your back to your side while in a flat bed without using bedrails?: A Little Help needed moving from lying on your back to sitting on the side of a flat bed without using bedrails?: A Little Help needed moving to and from a bed to a chair (including a wheelchair)?: A Little Help needed standing up from a chair using your arms (e.g., wheelchair or bedside chair)?: A Little Help needed to walk in hospital room?: A Lot Help needed climbing 3-5 steps with a railing? : A Lot 6 Click Score: 16    End of Session Equipment Utilized During Treatment: Gait belt Activity Tolerance: Patient tolerated treatment well Patient left: in bed;with call bell/phone within reach;with bed alarm set Nurse Communication: Mobility status PT Visit Diagnosis: Difficulty in walking, not elsewhere classified (R26.2)     Time: 1610-9604 PT Time  Calculation (min) (ACUTE ONLY): 21 min  Charges:  $Gait Training: 8-22 mins                     Lindalou Hose, PT DPT Acute Rehabilitation Services Office [phone]    Leonie Man 02/09/2023, 3:46 PM

## 2023-02-09 NOTE — Progress Notes (Signed)
ANTICOAGULATION CONSULT NOTE --follow up  Pharmacy Consult for Heparin Indication:  mechanical valve; holding warfarin/enoxaparin given bleeding  No Known Allergies  Patient Measurements: Height: 5\' 10"  (177.8 cm) Weight: (!) 165.6 kg (365 lb) IBW/kg (Calculated) : 73 Heparin Dosing Weight: 113.5 kg  Vital Signs: Temp: 98.5 F (36.9 C) (05/15 1558) Temp Source: Oral (05/15 1558) BP: 145/84 (05/15 1558) Pulse Rate: 75 (05/15 1130)  Labs: Recent Labs    02/07/23 0335 02/07/23 1006 02/07/23 1838 02/08/23 0426 02/09/23 0340 02/09/23 1524 02/09/23 1736  HGB 5.2*   < > 6.8* 8.3* 7.7*  --  8.3*  HCT 16.4*   < > 20.9* 25.1* 24.3*  --   --   PLT 258   < > 240 223 211  --   --   HEPARINUNFRC  --   --   --   --   --  0.29* 0.35  CREATININE 1.62*  --   --  1.08 0.97  --   --   CKTOTAL 44*  --   --  31*  --   --   --   TROPONINIHS  --   --   --  23*  --   --   --    < > = values in this interval not displayed.     Estimated Creatinine Clearance: 148.1 mL/min (by C-G formula based on SCr of 0.97 mg/dL).   Medical History: Past Medical History:  Diagnosis Date   Anxiety    Aortic stenosis    Cellulitis and abscess of lower extremity 06/11/2019   Cellulitis of fourth toe of left foot    Cholelithiasis    Coronary artery disease    Nonobstructive CAD (40-50% LAD) 08/2019   Depression    Diabetes mellitus without complication (HCC)    Phreesia 09/27/2020   Elevated troponin level not due myocardial infarction 11/11/2019   Essential hypertension    Gangrene of toe of left foot (HCC) 07/06/2019   Heart murmur    Phreesia 09/27/2020   Hyperlipidemia    Phreesia 09/27/2020   Hypertension    Phreesia 09/27/2020   Mixed hyperlipidemia    Morbid obesity (HCC)    S/P aortic valve replacement with mechanical valve 12/05/2019   25 mm Carbomedics top hat bileaflet mechanical valve via partial upper hemi-sternotomy   Severe aortic stenosis 09/24/2019   Type 2 diabetes  mellitus (HCC)     Medications:  Medications Prior to Admission  Medication Sig Dispense Refill Last Dose   daptomycin (CUBICIN) IVPB Inject 900 mg into the vein daily. Indication:  BKA site infection  First Dose: Yes Last Day of Therapy:  03/25/23  Labs - Once weekly:  CBC/D, BMP, and CPK Labs - Every other week:  ESR and CRP Method of administration: IV Push Method of administration may be changed at the discretion of home infusion pharmacist based upon assessment of the patient and/or caregiver's ability to self-administer the medication ordered. 52 Units 0 02/05/2023   ezetimibe (ZETIA) 10 MG tablet Take 1 tablet (10 mg total) by mouth daily. 90 tablet 3 02/05/2023   gabapentin (NEURONTIN) 300 MG capsule Take 2 capsules (600 mg total) by mouth 2 (two) times daily. 360 capsule 3 02/05/2023   insulin glargine (LANTUS) 100 UNIT/ML Solostar Pen Inject 30 Units into the skin daily. 15 mL 11 02/05/2023   lisinopril (ZESTRIL) 20 MG tablet Take 1 tablet (20 mg total) by mouth daily. 90 tablet 3 02/05/2023   metFORMIN (GLUCOPHAGE) 500 MG tablet  Take 2 tablets (1,000 mg total) by mouth 2 (two) times daily with a meal. 360 tablet 3 02/05/2023   methocarbamol (ROBAXIN) 500 MG tablet Take 1 tablet (500 mg total) by mouth every 6 (six) hours as needed for muscle spasms. 30 tablet 0 unknown   metoprolol tartrate (LOPRESSOR) 25 MG tablet Take 1 tablet (25 mg total) by mouth 2 (two) times daily. 180 tablet 3 02/05/2023 at 1800   oxyCODONE (OXY IR/ROXICODONE) 5 MG immediate release tablet Take 1-2 tablets (5-10 mg total) by mouth every 4 (four) hours as needed for moderate pain (pain score 4-6). 30 tablet 0 Past Week   Oxycodone HCl 10 MG TABS Take 1-1.5 tablets (10-15 mg total) by mouth every 4 (four) hours as needed for severe pain (pain score 7-10). 30 tablet 0 unknown   pantoprazole (PROTONIX) 40 MG tablet Take 1 tablet (40 mg total) by mouth daily. 90 tablet 3 02/05/2023   sertraline (ZOLOFT) 50 MG tablet Take  1 tablet (50 mg total) by mouth daily. 90 tablet 3 02/05/2023   warfarin (COUMADIN) 10 MG tablet Take 1 tablet daily except 1/2 tablet on Mondays, Wednesdays and Fridays or as directed (Patient taking differently: Take 5-10 mg by mouth See admin instructions. Take 10mg  by mouth daily except take 5mg  by mouth on Mondays, Wednesdays and Fridays.) 90 tablet 3 02/05/2023 at 1800   Zinc Sulfate 220 (50 Zn) MG TABS Take 1 tablet (220 mg total) by mouth daily. 100 tablet 0 Past Week   enoxaparin (LOVENOX) 100 MG/ML injection Inject 1 mL (100 mg total) into the skin 2 (two) times daily for 10 days. *Use with the 60mg /0.70mL syringe for a total of 160mg /dose* 20 mL 1    enoxaparin (LOVENOX) 60 MG/0.6ML injection Inject 0.6 mLs (60 mg total) into the skin 2 (two) times daily for 10 days. *Use with the 100mg /mL syringe for a total of 160mg /dose* 12 mL 1    Insulin Pen Needle 32G X 4 MM MISC Use with Lantus flexpen 100 each 0     Scheduled:   amLODipine  10 mg Oral Daily   Chlorhexidine Gluconate Cloth  6 each Topical Daily   docusate sodium  200 mg Oral BID   ezetimibe  10 mg Oral Daily   insulin aspart  0-15 Units Subcutaneous TID WC   insulin aspart  0-5 Units Subcutaneous QHS   insulin aspart  3 Units Subcutaneous TID WC   insulin detemir  12 Units Subcutaneous BID   metoprolol tartrate  25 mg Oral BID   pantoprazole  40 mg Oral Daily   polyethylene glycol  17 g Oral BID   sertraline  50 mg Oral Daily   Infusions:   DAPTOmycin (CUBICIN) 900 mg in sodium chloride 0.9 % IVPB Stopped (02/08/23 1957)   heparin 2,500 Units/hr (02/09/23 1524)   PRN: acetaminophen **OR** acetaminophen, methocarbamol, ondansetron **OR** ondansetron (ZOFRAN) IV, oxyCODONE, sodium chloride flush  Assessment: 46 yom with a history of T2DM, CAD, HTN, AVR, dyslipidemia. Recent admission with left BKA stump abscess s/p surgical I&D. Discharged from that encounter with enoxaparin/warfarin bridge. Patient is presenting with wound  check / bleeding. Heparin per pharmacy (to start 5/13am) consult placed for  mechanical AVR in the setting of holding warfarin/enoxaparin given wound site bleeding .  Home warfarin regimen is 10mg  T/R/Sa/Su and 5mg  on MWF and was bridging with 160 mg q12h SQ enoxaparin. Patient reports last warfarin dose 5/10 and last enoxaparin dose 5/11am.  PT/INR 18.4/1.5 on 02/06/2023  IV heparin to start when bleeding subsides and Hgb stable.  Hgb 4.4>5.2> 1 ut PRBC (5/13)>7.0>6.8> 1ut PRBCs(5/13)> 8.3   plt 300s-200s stable as of 02/08/2023/    5/14 RN reports patient is still having minimal bleeding to his stump when I did his dressing change! also had a tiny tiny amount of bleeding from his rectum from hemorrhoids .  This was discussed with Dr. Thedore Mins who ordered to keep IV heparin on hold until 5/15. Will follow up with MD on 5/15 for plan for heparin.   UPDATE:  5/15 Dr. Randol Kern said no bleeding from stump and gave order to restart IV heparin now.  Hgb 8.3> 7.7,  pltc wnl stable.  We will use lower HL goal 0.3-0.5 initially giving hgb and concern for bleeding. Recommend adjusting to higher goal when appropriate pending plan for anticoagulation moving forward and response to UFH. Notably, lower target goal was also utilized previous admission. Pharmacy plan for heparin rate based on:  ---previously therapeutic at 3350 units/hr but slightly above 0.5 goal ---conservatively we will start Heparin at 2500 units/hr given recent heparin level data ---would consider increasing starting rate vs change to enoxaparin in the AM pending hgb and bleeding status   PM UPDATE: heparin levels 0.29 > 0.35 drawn 4 and 6 hrs after resuming heparin 2500 units/hr; Hgb 8.3 improved from this AM. Per d/w RN, stump site is stable.  Goal of Therapy:  Heparin level 0.3-0.5 units/ml Monitor platelets by anticoagulation protocol: Yes   Plan:  Continue heparin drip at current rate Recheck HL in 6hr Daily HL and CBC while on  heparin Continue to monitor H&H and platelets  Thank you for allowing pharmacy to be part of this patients care team.  Loralee Pacas, PharmD, BCPS Please see amion for complete clinical pharmacist phone list 02/09/2023 6:40 PM

## 2023-02-10 DIAGNOSIS — D62 Acute posthemorrhagic anemia: Secondary | ICD-10-CM | POA: Diagnosis not present

## 2023-02-10 DIAGNOSIS — Z89512 Acquired absence of left leg below knee: Secondary | ICD-10-CM | POA: Diagnosis not present

## 2023-02-10 LAB — CBC WITH DIFFERENTIAL/PLATELET
Abs Immature Granulocytes: 0.08 10*3/uL — ABNORMAL HIGH (ref 0.00–0.07)
Basophils Absolute: 0.1 10*3/uL (ref 0.0–0.1)
Basophils Relative: 1 %
Eosinophils Absolute: 0.3 10*3/uL (ref 0.0–0.5)
Eosinophils Relative: 4 %
HCT: 24.6 % — ABNORMAL LOW (ref 39.0–52.0)
Hemoglobin: 7.9 g/dL — ABNORMAL LOW (ref 13.0–17.0)
Immature Granulocytes: 1 %
Lymphocytes Relative: 30 %
Lymphs Abs: 1.9 10*3/uL (ref 0.7–4.0)
MCH: 28.3 pg (ref 26.0–34.0)
MCHC: 32.1 g/dL (ref 30.0–36.0)
MCV: 88.2 fL (ref 80.0–100.0)
Monocytes Absolute: 0.5 10*3/uL (ref 0.1–1.0)
Monocytes Relative: 8 %
Neutro Abs: 3.6 10*3/uL (ref 1.7–7.7)
Neutrophils Relative %: 56 %
Platelets: 207 10*3/uL (ref 150–400)
RBC: 2.79 MIL/uL — ABNORMAL LOW (ref 4.22–5.81)
RDW: 14.1 % (ref 11.5–15.5)
WBC: 6.4 10*3/uL (ref 4.0–10.5)
nRBC: 0.3 % — ABNORMAL HIGH (ref 0.0–0.2)

## 2023-02-10 LAB — BASIC METABOLIC PANEL
Anion gap: 8 (ref 5–15)
BUN: 9 mg/dL (ref 6–20)
CO2: 26 mmol/L (ref 22–32)
Calcium: 8 mg/dL — ABNORMAL LOW (ref 8.9–10.3)
Chloride: 99 mmol/L (ref 98–111)
Creatinine, Ser: 0.86 mg/dL (ref 0.61–1.24)
GFR, Estimated: >60 mL/min (ref >60)
Glucose, Bld: 153 mg/dL — ABNORMAL HIGH (ref 70–99)
Potassium: 4 mmol/L (ref 3.5–5.1)
Sodium: 133 mmol/L — ABNORMAL LOW (ref 135–145)

## 2023-02-10 LAB — MAGNESIUM: Magnesium: 2.1 mg/dL (ref 1.7–2.4)

## 2023-02-10 LAB — GLUCOSE, CAPILLARY
Glucose-Capillary: 135 mg/dL — ABNORMAL HIGH (ref 70–99)
Glucose-Capillary: 160 mg/dL — ABNORMAL HIGH (ref 70–99)
Glucose-Capillary: 147 mg/dL — ABNORMAL HIGH (ref 70–99)
Glucose-Capillary: 151 mg/dL — ABNORMAL HIGH (ref 70–99)

## 2023-02-10 LAB — TYPE AND SCREEN: ABO/RH(D): A POS

## 2023-02-10 LAB — HEPARIN LEVEL (UNFRACTIONATED)
Heparin Unfractionated: 0.32 IU/mL (ref 0.30–0.70)
Heparin Unfractionated: 0.39 IU/mL (ref 0.30–0.70)
Heparin Unfractionated: 0.4 IU/mL (ref 0.30–0.70)

## 2023-02-10 LAB — BPAM RBC: Unit Type and Rh: 6200

## 2023-02-10 LAB — CULTURE, BLOOD (ROUTINE X 2)

## 2023-02-10 LAB — BRAIN NATRIURETIC PEPTIDE: B Natriuretic Peptide: 292.8 pg/mL — ABNORMAL HIGH (ref 0.0–100.0)

## 2023-02-10 LAB — PREPARE RBC (CROSSMATCH)

## 2023-02-10 MED ORDER — SODIUM CHLORIDE 0.9% IV SOLUTION: Freq: Once | INTRAVENOUS | Status: AC

## 2023-02-10 MED ORDER — WARFARIN SODIUM 10 MG PO TABS
10.0000 mg | ORAL_TABLET | Freq: Once | ORAL | Status: AC
  Administered 2023-02-10: 10 mg via ORAL
  Filled 2023-02-10: qty 1

## 2023-02-10 MED ORDER — WARFARIN - PHARMACIST DOSING INPATIENT: Freq: Every day | Status: DC

## 2023-02-10 NOTE — Progress Notes (Signed)
PROGRESS NOTE                                                                                                                                                                                                             Patient Demographics:    Adam Long, is a 47 y.o. male, DOB - May 14, 1976, NGE:[id number]  Outpatient Primary MD for the patient is [name], DO    LOS - 4  Admit date - 02/06/2023    Chief Complaint  Patient presents with   Post-op Problem       Brief Narrative (HPI from H&P)     47 y.o. male past medical history significant for anxiety coronary artery disease, essential hypertension diabetes mellitus type 2 mechanical aortic valve currently on Coumadin and Lovenox overlap, recently discharged from the hospital on 02/03/2023 for left BKA MRSA stump requiring revision (revision done by Dr. Lajoyce Corners on 01/29/2023, initial BKA was in 2022) surgical cultures confirm or say at home he was sent home on daptomycin with end of date 03/25/2023.   He went home and bumped his left BKA stump by mistake in his kitchen, subsequently started bleeding from the site came to the ER where he was diagnosed with significant blood loss related anemia and AKI and admitted to the hospital.   Subjective:   He denies any complaints today, no significant events overnight as discussed with staff, no bleeding from stump site.     Assessment  & Plan :    Acute blood loss anemia at the left BKA stump site due to mechanical injury in the setting of Coumadin and Lovenox overlap: He had massive bleed, total 5 units of packed RBC given last on 02/07/2023 afternoon, he has been seen by orthopedics on board, continue to monitor CBC, bleeding seems to have stopped -So far no evidence of significant bleed at the stump site, hemoglobin remained stable on heparin gtt. over the last 24 hours, he will be resumed back on warfarin  today.   -Will  transfuse 1 unit PRBC.   Acute kidney injury: Likely prerenal in the setting of ongoing ACE inhibitor use and severe anemia, transfuse, hold ACE, renal function has stabilized.  Hyponatremia: Likely pseudohyponatremia and a component of hypovolemia. Monitor clinically.  Check renal electrolytes.   Mechanical aortic valve repair in 2021: His INR goal  is 2-3, resuming heparin drip without bolus with caution on 02/07/2022.   S/P BKA (below knee amputation), left (HCC)/with history of left BKA MR stent stump abscess status post revision by due to on 01/29/2023, original left BKA was in 2022. Dr. Lajoyce Corners on board, continue wound care, his original left BKA was in 2022 followed by left BKA stump MRSA infection requiring revision by Dr. Lajoyce Corners on 01/29/2023, continue IV daptomycin with end date of 03/25/2023 > R Arm PICC.   Leukocytosis: Likely reactive likely due to severe acute blood loss, continue to monitor   Essential HTN (hypertension) Continue metoprolol, due to recent renal failure holding ACE inhibitor will add Norvasc instead.   Hyperlipidemia associated with type 2 diabetes mellitus (HCC) Continue statins.   Coronary artery disease: No anginal symptoms continue statins.   Anxiety: Continue Zoloft.   GERD: Continue Protonix.   Uncontrolled type 2 diabetes mellitus with hyperglycemia (HCC): Will allow him a diet, continue his long-acting insulin plus sensitive sliding scale, poor outpt control  Lab Results  Component Value Date   HGBA1C 13.4 (H) 01/24/2023   CBG (last 3)  Recent Labs    02/09/23 2137 02/10/23 0755 02/10/23 1210  GLUCAP 198* 135* 160*         Condition - Extremely Guarded  Family Communication  :  None  Code Status :  Full  Consults  :  Ortho  PUD Prophylaxis : PPI   Procedures  :            Disposition Plan  :    Status is: Inpatient   DVT Prophylaxis  :  SCD R Leg  Place and maintain sequential compression device Start: 02/07/23  0929 Lab Results  Component Value Date   PLT 207 02/10/2023    Diet :  Diet Order             Diet Carb Modified Fluid consistency: Thin; Room service appropriate? Yes  Diet effective now                    Inpatient Medications  Scheduled Meds:  sodium chloride   Intravenous Once   amLODipine  10 mg Oral Daily   Chlorhexidine Gluconate Cloth  6 each Topical Daily   docusate sodium  200 mg Oral BID   ezetimibe  10 mg Oral Daily   insulin aspart  0-15 Units Subcutaneous TID WC   insulin aspart  0-5 Units Subcutaneous QHS   insulin aspart  3 Units Subcutaneous TID WC   insulin detemir  12 Units Subcutaneous BID   metoprolol tartrate  25 mg Oral BID   pantoprazole  40 mg Oral Daily   polyethylene glycol  17 g Oral BID   sertraline  50 mg Oral Daily   warfarin  10 mg Oral ONCE-1600   Warfarin - Pharmacist Dosing Inpatient   Does not apply q1600   Continuous Infusions:  DAPTOmycin (CUBICIN) 900 mg in sodium chloride 0.9 % IVPB 900 mg (02/09/23 2256)   heparin 2,500 Units/hr (02/10/23 0749)   PRN Meds:.acetaminophen **OR** acetaminophen, methocarbamol, ondansetron **OR** ondansetron (ZOFRAN) IV, oxyCODONE, sodium chloride flush    Objective:   Vitals:   02/10/23 0315 02/10/23 0757 02/10/23 0800 02/10/23 1213  BP: 132/89 (!) 140/84 (!) 140/84 (!) 143/84  Pulse: 77     Resp: 16 20 17    Temp: 98.4 F (36.9 C) 97.7 F (36.5 C) 97.7 F (36.5 C)   TempSrc: Oral Oral  Oral  SpO2:  Weight:      Height:        Wt Readings from Last 3 Encounters:  02/06/23 (!) 165.6 kg  02/02/23 (!) 168.1 kg  11/09/22 (!) 173.3 kg     Intake/Output Summary (Last 24 hours) at 02/10/2023 1331 Last data filed at 02/10/2023 0400 Gross per 24 hour  Intake 137.08 ml  Output 2985 ml  Net -2847.92 ml     Physical Exam  Awake Alert, Oriented X 3, No new F.N deficits, Normal affect Symmetrical Chest wall movement, Good air movement bilaterally, CTAB RRR,No Gallops,Rubs or new  Murmurs, No Parasternal Heave +ve B.Sounds, Abd Soft, No tenderness, No rebound - guarding or rigidity. L BKA stump under pressure bandage, gauze has been seen and examined with no evidence of bleed.  R Arm PICC.   Data Review:    Recent Labs  Lab 02/06/23 1110 02/07/23 0335 02/07/23 1006 02/07/23 1838 02/08/23 0426 02/09/23 0340 02/09/23 1736 02/10/23 0355  WBC 16.2*   < > 12.9* 9.9 8.5 6.6  --  6.4  HGB 4.4*   < > 7.0* 6.8* 8.3* 7.7* 8.3* 7.9*  HCT 14.9*   < > 21.2* 20.9* 25.1* 24.3*  --  24.6*  PLT 347   < > 312 240 223 211  --  207  MCV 89.8   < > 87.6 87.8 87.5 86.8  --  88.2  MCH 26.5   < > 28.9 28.6 28.9 27.5  --  28.3  MCHC 29.5*   < > 33.0 32.5 33.1 31.7  --  32.1  RDW 14.2   < > 14.3 14.4 14.6 14.5  --  14.1  LYMPHSABS 1.4  --   --   --  2.0 1.6  --  1.9  MONOABS 0.6  --   --   --  0.6 0.5  --  0.5  EOSABS 0.0  --   --   --  0.2 0.2  --  0.3  BASOSABS 0.0  --   --   --  0.1 0.0  --  0.1   < > = values in this interval not displayed.    Recent Labs  Lab 02/06/23 1110 02/07/23 0335 02/08/23 0426 02/09/23 0340 02/10/23 0355  NA 131* 130* 135 135 133*  K 3.9 3.7 3.9 3.8 4.0  CL 97* 99 105 105 99  CO2 17* 24 24 25 26   ANIONGAP 17* 7 6 5 8   GLUCOSE 415* 174* 142* 185* 153*  BUN 33* 30* 20 12 9   CREATININE 2.38* 1.62* 1.08 0.97 0.86  AST 23  --   --   --   --   ALT 22  --   --   --   --   ALKPHOS 42  --   --   --   --   BILITOT 0.7  --   --   --   --   ALBUMIN 2.4*  --   --   --   --   LATICACIDVEN 5.5*  --   --   --   --   INR 1.5*  --   --   --   --   BNP  --  182.2* 449.6* 491.6* 292.8*  MG  --  2.0 2.0 2.1 2.1  CALCIUM 8.0* 7.5* 7.8* 7.8* 8.0*      Recent Labs  Lab 02/06/23 1110 02/07/23 0335 02/08/23 0426 02/09/23 0340 02/10/23 0355  LATICACIDVEN 5.5*  --   --   --   --  INR 1.5*  --   --   --   --   BNP  --  182.2* 449.6* 491.6* 292.8*  MG  --  2.0 2.0 2.1 2.1  CALCIUM 8.0* 7.5* 7.8* 7.8* 8.0*   Micro Results Recent Results (from the  past 240 hour(s))  Culture, blood (Routine X 2) w Reflex to ID Panel     Status: None (Preliminary result)   Collection Time: 02/08/23  4:27 AM   Specimen: BLOOD LEFT HAND  Result Value Ref Range Status   Specimen Description BLOOD LEFT HAND  Final   Special Requests   Final    BOTTLES DRAWN AEROBIC AND ANAEROBIC Blood Culture adequate volume   Culture   Final    NO GROWTH 2 DAYS Performed at [facility], [address], [place], [place] [address]    Report Status PENDING  Incomplete  Culture, blood (Routine X 2) w Reflex to ID Panel     Status: None (Preliminary result)   Collection Time: 02/08/23  4:29 AM   Specimen: BLOOD LEFT HAND  Result Value Ref Range Status   Specimen Description BLOOD LEFT HAND  Final   Special Requests   Final    BOTTLES DRAWN AEROBIC AND ANAEROBIC Blood Culture results may not be optimal due to an excessive volume of blood received in culture bottles   Culture   Final    NO GROWTH 2 DAYS Performed at [facility], [address], [place], [place] [address]    Report Status PENDING  Incomplete    Radiology Reports No results found.    Signature  -   Huey Bienenstock M.D on 02/10/2023 at 1:31 PM   -  To page go to [email or web address]

## 2023-02-10 NOTE — Evaluation (Signed)
Occupational Therapy Evaluation and Discharge Patient Details Name: Adam Long MRN: [id number] DOB: [birth year] Today's Date: 02/10/2023   History of Present Illness 47 y/o M admitted to [facility] on 5/12 after hitting his recently revised L BKA, with complaints of bleeding and generalized weakness. Pt recently hospitalized 4/28-5/9 for his L BKA revision with Dr. Lajoyce Corners on 5/4. PMHx: anxiety, CAD, HTN, DM, mechanical aortic valve, on Coumadin and Lovenox.   Clinical Impression   Pt is near his baseline in ADLs and mobility. He is needing more assistance for posterior pericare in this environment due to different set up vs home, does not desire a toilet aid. Pt typically sits to bathe and dress and leans side to side for LB ADLs. He must use his walker to get into his bedroom and bathroom due to w/c not fitting through doorway. He prepares meals from his manual w/c. Recommended pt use his limb protector on L LE. Recommending continued ADLs and supervision for OOB for safety and lines. No further OT needs.      Recommendations for follow up therapy are one component of a multi-disciplinary discharge planning process, led by the attending physician.  Recommendations may be updated based on patient status, additional functional criteria and insurance authorization.   Assistance Recommended at Discharge Set up Supervision/Assistance  Patient can return home with the following A little help with walking and/or transfers;A little help with bathing/dressing/bathroom;Assistance with cooking/housework;Assist for transportation;Help with stairs or ramp for entrance    Functional Status Assessment  Patient has had a recent decline in their functional status and demonstrates the ability to make significant improvements in function in a reasonable and predictable amount of time.  Equipment Recommendations  None recommended by OT    Recommendations for Other Services       Precautions / Restrictions  Precautions Precautions: Fall;Other (comment) Precaution Comments: recent L BKA revision Restrictions Weight Bearing Restrictions: Yes LLE Weight Bearing: Non weight bearing Other Position/Activity Restrictions: pt has limb protectors at home, encouraged use      Mobility Bed Mobility Overal bed mobility: Modified Independent                  Transfers                          Balance                                           ADL either performed or assessed with clinical judgement   ADL Overall ADL's : Needs assistance/impaired Eating/Feeding: Independent   Grooming: Set up;Sitting   Upper Body Bathing: Set up;Sitting   Lower Body Bathing: Minimal assistance;Sitting/lateral leans   Upper Body Dressing : Set up;Sitting   Lower Body Dressing: Set up;Sitting/lateral leans   Toilet Transfer: Min guard;Stand-pivot;Rolling walker (2 wheels);BSC/3in1   Toileting- Clothing Manipulation and Hygiene: Minimal assistance;Sitting/lateral lean Toileting - Clothing Manipulation Details (indicate cue type and reason): needing assist here for posterior pericare, reports it will not be necessary when he returns to his BSC, offered toilet aid, pt declined             Vision Baseline Vision/History: 1 Wears glasses Ability to See in Adequate Light: 0 Adequate Patient Visual Report: No change from baseline       Perception     Praxis  Pertinent Vitals/Pain Pain Assessment Pain Assessment: Faces Faces Pain Scale: No hurt     Hand Dominance Right   Extremity/Trunk Assessment Upper Extremity Assessment Upper Extremity Assessment: Overall WFL for tasks assessed   Lower Extremity Assessment Lower Extremity Assessment: Defer to PT evaluation   Cervical / Trunk Assessment Cervical / Trunk Exceptions: increased body habitus   Communication Communication Communication: No difficulties   Cognition Arousal/Alertness:  Awake/alert Behavior During Therapy: WFL for tasks assessed/performed Overall Cognitive Status: Within Functional Limits for tasks assessed                                 General Comments: pt has specific techniques he uses to complete ADLs and IADL, likely to be more independent in his own environment     General Comments       Exercises     Shoulder Instructions      Home Living Family/patient expects to be discharged to:: Private residence Living Arrangements: Other relatives Available Help at Discharge: Family;Friend(s);Available PRN/intermittently Type of Home: House Home Access: Stairs to enter [employer] of Steps: 2 short steps Entrance Stairs-Rails: None Home Layout: Two level;Able to live on main level with bedroom/bathroom     Bathroom Shower/Tub: Chief Strategy Officer: Handicapped height Bathroom Accessibility: Yes How Accessible: Accessible via walker (w/c does not fit) Home Equipment: Rolling Walker (2 wheels);BSC/3in1;Wheelchair - manual;Tub bench   Additional Comments: uncle, cousin also helps pt      Prior Functioning/Environment Prior Level of Function : Independent/Modified Independent             Mobility Comments: uses w/c in kitchen and has been able to walk with RW without prosthesis into bedroom and bathroom ADLs Comments: modified independent, leans side to side vs standing with surface behind him for LB dressing        OT Problem List:        OT Treatment/Interventions:      OT Goals(Current goals can be found in the care plan section) Acute Rehab OT Goals OT Goal Formulation: With patient  OT Frequency:      Co-evaluation              AM-PAC OT "6 Clicks" Daily Activity     Outcome Measure Help from another person eating meals?: None Help from another person taking care of personal grooming?: A Little Help from another person toileting, which includes using toliet, bedpan, or  urinal?: A Little Help from another person bathing (including washing, rinsing, drying)?: A Little Help from another person to put on and taking off regular upper body clothing?: A Little Help from another person to put on and taking off regular lower body clothing?: A Little 6 Click Score: 19   End of Session    Activity Tolerance: Patient tolerated treatment well Patient left: in bed;with call bell/phone within reach  OT Visit Diagnosis: Unsteadiness on feet (R26.81);Other abnormalities of gait and mobility (R26.89)                Time: 1610-9604 OT Time Calculation (min): 17 min Charges:  OT General Charges $OT Visit: 1 Visit OT Evaluation $OT Eval Low Complexity: 1 Low  Berna Spare, OTR/L Acute Rehabilitation Services Office: [phone]  Evern Bio 02/10/2023, 3:39 PM

## 2023-02-10 NOTE — Plan of Care (Signed)
  Problem: Education: Goal: Knowledge of the prescribed therapeutic regimen will improve Outcome: Progressing Goal: Ability to verbalize activity precautions or restrictions will improve Outcome: Progressing Goal: Understanding of discharge needs will improve Outcome: Progressing   Problem: Activity: Goal: Ability to perform//tolerate increased activity and mobilize with assistive devices will improve Outcome: Progressing   Problem: Clinical Measurements: Goal: Postoperative complications will be avoided or minimized Outcome: Progressing   Problem: Self-Care: Goal: Ability to meet self-care needs will improve Outcome: Progressing   Problem: Self-Concept: Goal: Ability to maintain and perform role responsibilities to the fullest extent possible will improve Outcome: Progressing   Problem: Pain Management: Goal: Pain level will decrease with appropriate interventions Outcome: Progressing   Problem: Skin Integrity: Goal: Demonstration of wound healing without infection will improve Outcome: Progressing   Problem: Education: Goal: Ability to describe self-care measures that may prevent or decrease complications (Diabetes Survival Skills Education) will improve Outcome: Progressing Goal: Individualized Educational Video(s) Outcome: Progressing   Problem: Coping: Goal: Ability to adjust to condition or change in health will improve Outcome: Progressing   Problem: Fluid Volume: Goal: Ability to maintain a balanced intake and output will improve Outcome: Progressing   Problem: Health Behavior/Discharge Planning: Goal: Ability to identify and utilize available resources and services will improve Outcome: Progressing Goal: Ability to manage health-related needs will improve Outcome: Progressing   Problem: Metabolic: Goal: Ability to maintain appropriate glucose levels will improve Outcome: Progressing   Problem: Nutritional: Goal: Maintenance of adequate nutrition will  improve Outcome: Progressing Goal: Progress toward achieving an optimal weight will improve Outcome: Progressing   Problem: Skin Integrity: Goal: Risk for impaired skin integrity will decrease Outcome: Progressing   Problem: Tissue Perfusion: Goal: Adequacy of tissue perfusion will improve Outcome: Progressing   Problem: Education: Goal: Knowledge of General Education information will improve Description: Including pain rating scale, medication(s)/side effects and non-pharmacologic comfort measures Outcome: Progressing   Problem: Health Behavior/Discharge Planning: Goal: Ability to manage health-related needs will improve Outcome: Progressing   Problem: Clinical Measurements: Goal: Ability to maintain clinical measurements within normal limits will improve Outcome: Progressing Goal: Will remain free from infection Outcome: Progressing Goal: Diagnostic test results will improve Outcome: Progressing Goal: Respiratory complications will improve Outcome: Progressing Goal: Cardiovascular complication will be avoided Outcome: Progressing   Problem: Activity: Goal: Risk for activity intolerance will decrease Outcome: Progressing   Problem: Nutrition: Goal: Adequate nutrition will be maintained Outcome: Progressing   Problem: Coping: Goal: Level of anxiety will decrease Outcome: Progressing   Problem: Elimination: Goal: Will not experience complications related to bowel motility Outcome: Progressing Goal: Will not experience complications related to urinary retention Outcome: Progressing   Problem: Pain Managment: Goal: General experience of comfort will improve Outcome: Progressing   Problem: Safety: Goal: Ability to remain free from injury will improve Outcome: Progressing   Problem: Skin Integrity: Goal: Risk for impaired skin integrity will decrease Outcome: Progressing   

## 2023-02-10 NOTE — Progress Notes (Signed)
Mobility Specialist Progress Note   02/10/23 1033  Mobility  Activity Ambulated with assistance in room  Level of Assistance Contact guard assist, steadying assist  Assistive Device Front wheel walker  Distance Ambulated (ft) 32 ft (24ft + 49ft)  LLE Weight Bearing NWB  Activity Response Tolerated well  Mobility Referral Yes  $Mobility charge 1 Mobility  Mobility Specialist Start Time (ACUTE ONLY) 1033  Mobility Specialist Stop Time (ACUTE ONLY) 1053  Mobility Specialist Time Calculation (min) (ACUTE ONLY) 20 min   Received in bed having mild c/o pain in LE(5/10) but agreeable. Able to get EOB and stand w/ stand by assist, CGA for ambulation. Pt fatiguing quickly and requiring x1 seated rest break but able to do bouts of 29ft in the room. HR ranging from 85 - 119. Returned back to bed and laid supine w/ stand by assist, call bell placed in reach.  Frederico Hamman Mobility Specialist Please contact via SecureChat or  Rehab office at [phone]

## 2023-02-10 NOTE — Progress Notes (Addendum)
ANTICOAGULATION CONSULT NOTE  Pharmacy Consult for Heparin;   Resume Warfarin Indication:  mechanical valve; holding warfarin/enoxaparin given bleeding  No Known Allergies  Patient Measurements: Height: 5\' 10"  (177.8 cm) Weight: (!) 165.6 kg (365 lb) IBW/kg (Calculated) : 73 Heparin Dosing Weight: 113.5 kg  Vital Signs: Temp: 97.7 F (36.5 C) (05/16 0800) Temp Source: Oral (05/16 1213) BP: 143/84 (05/16 1213) Pulse Rate: 77 (05/16 0315)  Labs: Recent Labs    02/08/23 0426 02/09/23 0340 02/09/23 1524 02/09/23 1736 02/09/23 2339 02/10/23 0355 02/10/23 1023  HGB 8.3* 7.7*  --  8.3*  --  7.9*  --   HCT 25.1* 24.3*  --   --   --  24.6*  --   PLT 223 211  --   --   --  207  --   HEPARINUNFRC  --   --    < > 0.35 0.40 0.39 0.32  CREATININE 1.08 0.97  --   --   --  0.86  --   CKTOTAL 31*  --   --   --   --   --   --   TROPONINIHS 23*  --   --   --   --   --   --    < > = values in this interval not displayed.     Estimated Creatinine Clearance: 167 mL/min (by C-G formula based on SCr of 0.86 mg/dL).   Assessment: 27 yom with a history of T2DM, CAD, HTN, AVR, dyslipidemia. Recent admission with left BKA stump abscess s/p surgical I&D. Discharged from that encounter with enoxaparin/warfarin bridge. Patient is presenting with wound check / bleeding. Heparin per pharmacy (to start 5/13am) consult placed for  mechanical AVR in the setting of holding warfarin/enoxaparin given wound site bleeding .  Home warfarin regimen is 10mg  T/R/Sa/Su and 5mg  on MWF and was bridging with 160 mg q12h SQ enoxaparin. Patient reports last warfarin dose 5/10 and last enoxaparin dose 5/11am.  Heparin level therapeutic (0.39) this AM and rechecked HL is 0.32, remains therapeutic on Heparin infusion at 2500 units/hr. No bleeding. Hgb 7.9,  pltc wnl. MD plans to give 1 unit PRBCs today.  INR was 1.5 on 5/12.  Warfarin last given on 5/11 PTA.Marland Kitchen   Attending MD is resuming Warfarin today 5/16.   Goal of  Therapy:  Heparin level 0.3-0.5 units/ml INR goal 2-3 Monitor platelets by anticoagulation protocol: Yes   Plan:  Continue heparin drip at 2500 units/hr Daily heparin level and CBC while on heparin Resume Warfarin , give 10mg  today x1 Daily INR   Thank you for allowing pharmacy to be part of this patients care team.   Noah Delaine, RPh Clinical Pharmacist Please see amion for complete clinical pharmacist phone list 02/10/2023 12:49 PM

## 2023-02-10 NOTE — Progress Notes (Signed)
ANTICOAGULATION CONSULT NOTE  Pharmacy Consult for Heparin Indication:  mechanical valve; holding warfarin/enoxaparin given bleeding  No Known Allergies  Patient Measurements: Height: 5\' 10"  (177.8 cm) Weight: (!) 165.6 kg (365 lb) IBW/kg (Calculated) : 73 Heparin Dosing Weight: 113.5 kg  Vital Signs: Temp: 98.5 F (36.9 C) (05/15 2306) Temp Source: Oral (05/15 2306) BP: 134/77 (05/15 2306) Pulse Rate: 75 (05/15 2306)  Labs: Recent Labs    02/07/23 0335 02/07/23 1006 02/07/23 1838 02/08/23 0426 02/09/23 0340 02/09/23 1524 02/09/23 1736 02/09/23 2339  HGB 5.2*   < > 6.8* 8.3* 7.7*  --  8.3*  --   HCT 16.4*   < > 20.9* 25.1* 24.3*  --   --   --   PLT 258   < > 240 223 211  --   --   --   HEPARINUNFRC  --   --   --   --   --  0.29* 0.35 0.40  CREATININE 1.62*  --   --  1.08 0.97  --   --   --   CKTOTAL 44*  --   --  31*  --   --   --   --   TROPONINIHS  --   --   --  23*  --   --   --   --    < > = values in this interval not displayed.     Estimated Creatinine Clearance: 148.1 mL/min (by C-G formula based on SCr of 0.97 mg/dL).   Assessment: 22 yom with a history of T2DM, CAD, HTN, AVR, dyslipidemia. Recent admission with left BKA stump abscess s/p surgical I&D. Discharged from that encounter with enoxaparin/warfarin bridge. Patient is presenting with wound check / bleeding. Heparin per pharmacy (to start 5/13am) consult placed for  mechanical AVR in the setting of holding warfarin/enoxaparin given wound site bleeding .  Home warfarin regimen is 10mg  T/R/Sa/Su and 5mg  on MWF and was bridging with 160 mg q12h SQ enoxaparin. Patient reports last warfarin dose 5/10 and last enoxaparin dose 5/11am.  Heparin level therapeutic (0.4) on infusion at 2500 units/hr. RN reports no bleeding from stump site.  Goal of Therapy:  Heparin level 0.3-0.5 units/ml Monitor platelets by anticoagulation protocol: Yes   Plan:  Continue heparin drip at 2500 units/hr Daily heparin level  and CBC while on heparin  Christoper Fabian, PharmD, BCPS Please see amion for complete clinical pharmacist phone list 02/10/2023 12:18 AM

## 2023-02-11 ENCOUNTER — Other Ambulatory Visit (HOSPITAL_COMMUNITY): Payer: Self-pay

## 2023-02-11 DIAGNOSIS — D62 Acute posthemorrhagic anemia: Secondary | ICD-10-CM | POA: Diagnosis not present

## 2023-02-11 LAB — PROTIME-INR
INR: 1.1 (ref 0.8–1.2)
Prothrombin Time: 14.2 seconds (ref 11.4–15.2)

## 2023-02-11 LAB — CBC WITH DIFFERENTIAL/PLATELET
Abs Immature Granulocytes: 0.06 10*3/uL (ref 0.00–0.07)
Basophils Absolute: 0 10*3/uL (ref 0.0–0.1)
Basophils Relative: 1 %
Eosinophils Absolute: 0.2 10*3/uL (ref 0.0–0.5)
Eosinophils Relative: 4 %
HCT: 28.4 % — ABNORMAL LOW (ref 39.0–52.0)
Hemoglobin: 9.1 g/dL — ABNORMAL LOW (ref 13.0–17.0)
Immature Granulocytes: 1 %
Lymphocytes Relative: 28 %
Lymphs Abs: 1.8 10*3/uL (ref 0.7–4.0)
MCH: 27.6 pg (ref 26.0–34.0)
MCHC: 32 g/dL (ref 30.0–36.0)
MCV: 86.1 fL (ref 80.0–100.0)
Monocytes Absolute: 0.4 10*3/uL (ref 0.1–1.0)
Monocytes Relative: 7 %
Neutro Abs: 3.8 10*3/uL (ref 1.7–7.7)
Neutrophils Relative %: 59 %
Platelets: 240 10*3/uL (ref 150–400)
RBC: 3.3 MIL/uL — ABNORMAL LOW (ref 4.22–5.81)
RDW: 13.9 % (ref 11.5–15.5)
WBC: 6.4 10*3/uL (ref 4.0–10.5)
nRBC: 0 % (ref 0.0–0.2)

## 2023-02-11 LAB — TYPE AND SCREEN
Antibody Screen: NEGATIVE
Unit division: 0

## 2023-02-11 LAB — BASIC METABOLIC PANEL
Anion gap: 7 (ref 5–15)
BUN: 9 mg/dL (ref 6–20)
CO2: 25 mmol/L (ref 22–32)
Calcium: 8.5 mg/dL — ABNORMAL LOW (ref 8.9–10.3)
Chloride: 101 mmol/L (ref 98–111)
Creatinine, Ser: 0.87 mg/dL (ref 0.61–1.24)
GFR, Estimated: >60 mL/min (ref >60)
Glucose, Bld: 151 mg/dL — ABNORMAL HIGH (ref 70–99)
Potassium: 4.2 mmol/L (ref 3.5–5.1)
Sodium: 133 mmol/L — ABNORMAL LOW (ref 135–145)

## 2023-02-11 LAB — CULTURE, BLOOD (ROUTINE X 2): Culture: NO GROWTH

## 2023-02-11 LAB — BPAM RBC
Blood Product Expiration Date: 202406142359
ISSUE DATE / TIME: 202405161544

## 2023-02-11 LAB — GLUCOSE, CAPILLARY
Glucose-Capillary: 127 mg/dL — ABNORMAL HIGH (ref 70–99)
Glucose-Capillary: 157 mg/dL — ABNORMAL HIGH (ref 70–99)
Glucose-Capillary: 188 mg/dL — ABNORMAL HIGH (ref 70–99)
Glucose-Capillary: 139 mg/dL — ABNORMAL HIGH (ref 70–99)

## 2023-02-11 LAB — MAGNESIUM: Magnesium: 2 mg/dL (ref 1.7–2.4)

## 2023-02-11 LAB — HEPARIN LEVEL (UNFRACTIONATED): Heparin Unfractionated: 0.45 IU/mL (ref 0.30–0.70)

## 2023-02-11 LAB — BRAIN NATRIURETIC PEPTIDE: B Natriuretic Peptide: 136.8 pg/mL — ABNORMAL HIGH (ref 0.0–100.0)

## 2023-02-11 MED ORDER — WARFARIN SODIUM 7.5 MG PO TABS: 15.0000 mg | ORAL_TABLET | Freq: Once | ORAL | Status: DC

## 2023-02-11 MED ORDER — WARFARIN SODIUM 2.5 MG PO TABS
12.5000 mg | ORAL_TABLET | Freq: Once | ORAL | Status: AC
  Administered 2023-02-11: 12.5 mg via ORAL
  Filled 2023-02-11: qty 1

## 2023-02-11 NOTE — Progress Notes (Signed)
Physical Therapy Treatment Patient Details Name: Adam Long MRN: [id number] DOB: 12-Sep-1976 Today's Date: 02/11/2023   History of Present Illness 47 y/o M admitted to [facility] on 5/12 after hitting his recently revised L BKA, with complaints of bleeding and generalized weakness. Pt recently hospitalized 4/28-5/9 for his L BKA revision with Dr. Lajoyce Corners on 5/4. PMHx: anxiety, CAD, HTN, DM, mechanical aortic valve, on Coumadin and Lovenox.    PT Comments    Pt greeted resting in bed and agreeable to session with continued progress towards acute goals. Pt requiring supervision for bed mobility with slightly increased time and use of bed features, and grossly min guard assist for functional transfers and short gait in room with RW support. Pt with good foot clearance during hop-to gait on RLE, however distance limited by fatigue and decreased activity tolerance. Educated pt re; importance of checking skin of residual limb, time up OOB and appropriate activity progression with pt verbalizing understanding. Pt continues to benefit from skilled PT services to progress toward functional mobility goals.     Recommendations for follow up therapy are one component of a multi-disciplinary discharge planning process, led by the attending physician.  Recommendations may be updated based on patient status, additional functional criteria and insurance authorization.  Follow Up Recommendations       Assistance Recommended at Discharge Intermittent Supervision/Assistance  Patient can return home with the following Assistance with cooking/housework;Assist for transportation;Help with stairs or ramp for entrance;A little help with walking and/or transfers   Equipment Recommendations  None recommended by PT    Recommendations for Other Services       Precautions / Restrictions Precautions Precautions: Fall;Other (comment) Precaution Comments: recent L BKA revision Restrictions Weight Bearing Restrictions:  Yes LLE Weight Bearing: Non weight bearing Other Position/Activity Restrictions: pt has limb protectors at home, encouraged use     Mobility  Bed Mobility Overal bed mobility: Modified Independent Bed Mobility: Supine to Sit           General bed mobility comments: + bed rail use    Transfers Overall transfer level: Needs assistance Equipment used: Rolling walker (2 wheels) Transfers: Sit to/from Stand Sit to Stand: Min guard           General transfer comment: min guard for safety, able to complete serial sit<>stand from recliner at end of session x5    Ambulation/Gait Ambulation/Gait assistance: Min guard Gait Distance (Feet): 16 Feet Assistive device: Rolling walker (2 wheels) Gait Pattern/deviations:  (hop-to pattern on R) Gait velocity: decreased     General Gait Details: pt continues to be limtied by endurance, good foot clearnace, no LOB   Stairs             Wheelchair Mobility    Modified Rankin (Stroke Patients Only)       Balance Overall balance assessment: Needs assistance Sitting-balance support: Feet supported, No upper extremity supported Sitting balance-Leahy Scale: Good Sitting balance - Comments: able to don R shoe without LOB   Standing balance support: Bilateral upper extremity supported, Reliant on assistive device for balance, During functional activity Standing balance-Leahy Scale: Poor Standing balance comment: reliant on RW but good balance with mobility                            Cognition Arousal/Alertness: Awake/alert Behavior During Therapy: WFL for tasks assessed/performed Overall Cognitive Status: Within Functional Limits for tasks assessed  Exercises Other Exercises Other Exercises: serial sit<>stand x5    General Comments General comments (skin integrity, edema, etc.): VSS on RA      Pertinent Vitals/Pain Pain Assessment Pain  Assessment: Faces Faces Pain Scale: No hurt Pain Intervention(s): Monitored during session    Home Living                          Prior Function            PT Goals (current goals can now be found in the care plan section) Acute Rehab PT Goals PT Goal Formulation: With patient Time For Goal Achievement: 02/21/23 Progress towards PT goals: Progressing toward goals    Frequency    Min 3X/week      PT Plan Current plan remains appropriate    Co-evaluation              AM-PAC PT "6 Clicks" Mobility   Outcome Measure  Help needed turning from your back to your side while in a flat bed without using bedrails?: A Little Help needed moving from lying on your back to sitting on the side of a flat bed without using bedrails?: A Little Help needed moving to and from a bed to a chair (including a wheelchair)?: A Little Help needed standing up from a chair using your arms (e.g., wheelchair or bedside chair)?: A Little Help needed to walk in hospital room?: A Little Help needed climbing 3-5 steps with a railing? : A Lot 6 Click Score: 17    End of Session   Activity Tolerance: Patient tolerated treatment well Patient left: with call bell/phone within reach;in chair;with chair alarm set Nurse Communication: Mobility status PT Visit Diagnosis: Difficulty in walking, not elsewhere classified (R26.2)     Time: 1003-1020 PT Time Calculation (min) (ACUTE ONLY): 17 min  Charges:  $Therapeutic Activity: 8-22 mins                     Adam Long R. PTA Acute Rehabilitation Services Office: [phone]   Adam Long 02/11/2023, 10:29 AM

## 2023-02-11 NOTE — Progress Notes (Signed)
ANTICOAGULATION CONSULT NOTE  Pharmacy Consult for Heparin;  and 5/16: Resume Warfarin Indication:  mechanical valve; holding warfarin/enoxaparin given bleeding  No Known Allergies  Patient Measurements: Height: 5\' 10"  (177.8 cm) Weight: (!) 165.6 kg (365 lb) IBW/kg (Calculated) : 73 Heparin Dosing Weight: 113.5 kg  Vital Signs: Temp: 98.1 F (36.7 C) (05/17 0805) Temp Source: Oral (05/17 0805) BP: 143/89 (05/17 0805) Pulse Rate: 74 (05/17 0805)  Labs: Recent Labs    02/09/23 0340 02/09/23 1524 02/10/23 0355 02/10/23 1023 02/11/23 0339  HGB 7.7*   < > 7.9*  --  9.1*  HCT 24.3*  --  24.6*  --  28.4*  PLT 211  --  207  --  240  LABPROT  --   --   --   --  14.2  INR  --   --   --   --  1.1  HEPARINUNFRC  --    < > 0.39 0.32 0.45  CREATININE 0.97  --  0.86  --  0.87   < > = values in this interval not displayed.     Estimated Creatinine Clearance: 165.1 mL/min (by C-G formula based on SCr of 0.87 mg/dL).   Assessment: 72 yom with a history of T2DM, CAD, HTN, AVR, dyslipidemia. Recent admission with left BKA stump abscess s/p surgical I&D. Discharged from that encounter with enoxaparin/warfarin bridge. Patient is presenting with wound check / bleeding. Heparin per pharmacy (to start 5/13am) consult placed for  mechanical AVR in the setting of holding warfarin/enoxaparin given wound site bleeding .  Home warfarin regimen is 10mg  T/R/Sa/Su and 5mg  on MWF and was bridging with 160 mg q12h SQ enoxaparin. Patient reports last warfarin dose 5/10 and last enoxaparin dose 5/11am.   02/11/23:  Heparin level  (0.45), remains therapeutic on Heparin infusion at 2500 units/hr.  Warfarin resumed 5/16.   INR today is 1.1, as expected having been off warfarin since last dose 02/05/23. Hgb 8.3>7.9>  transfused  ut PRCs  yesterday (5/16)> Hgb 9.1 today.  Has received blood transfusions 5/12, 5/13 and 5/16 this admission.  No stump bleeding or other bleeding reported.  Prior warfarin  dosing was  10mg  T/R/Sa/Su and 5mg  on MWF .  Will likely require a few higher doses to move INR upward toward goal INR 2-3.     Goal of Therapy:  Heparin level 0.3-0.5 units/ml INR goal 2-3 Monitor platelets by anticoagulation protocol: Yes   Plan:  Continue heparin drip at 2500 units/hr Warfarin 12.5mg  x1 today Daily heparin level , INR and CBC Monitor for s/sx of bleeding  Thank you for allowing pharmacy to be part of this patients care team.   Noah Delaine, RPh Clinical Pharmacist Please see amion for complete clinical pharmacist phone list 02/11/2023 10:00 AM

## 2023-02-11 NOTE — Plan of Care (Signed)
  Problem: Education: Goal: Knowledge of the prescribed therapeutic regimen will improve Outcome: Progressing Goal: Ability to verbalize activity precautions or restrictions will improve Outcome: Progressing Goal: Understanding of discharge needs will improve Outcome: Progressing   Problem: Activity: Goal: Ability to perform//tolerate increased activity and mobilize with assistive devices will improve Outcome: Progressing   Problem: Clinical Measurements: Goal: Postoperative complications will be avoided or minimized Outcome: Progressing   Problem: Self-Care: Goal: Ability to meet self-care needs will improve Outcome: Progressing   Problem: Self-Concept: Goal: Ability to maintain and perform role responsibilities to the fullest extent possible will improve Outcome: Progressing   Problem: Pain Management: Goal: Pain level will decrease with appropriate interventions Outcome: Progressing   Problem: Skin Integrity: Goal: Demonstration of wound healing without infection will improve Outcome: Progressing   Problem: Education: Goal: Ability to describe self-care measures that may prevent or decrease complications (Diabetes Survival Skills Education) will improve Outcome: Progressing Goal: Individualized Educational Video(s) Outcome: Progressing   Problem: Coping: Goal: Ability to adjust to condition or change in health will improve Outcome: Progressing   Problem: Fluid Volume: Goal: Ability to maintain a balanced intake and output will improve Outcome: Progressing   Problem: Health Behavior/Discharge Planning: Goal: Ability to identify and utilize available resources and services will improve Outcome: Progressing Goal: Ability to manage health-related needs will improve Outcome: Progressing   Problem: Metabolic: Goal: Ability to maintain appropriate glucose levels will improve Outcome: Progressing   Problem: Nutritional: Goal: Maintenance of adequate nutrition will  improve Outcome: Progressing Goal: Progress toward achieving an optimal weight will improve Outcome: Progressing   Problem: Skin Integrity: Goal: Risk for impaired skin integrity will decrease Outcome: Progressing   Problem: Tissue Perfusion: Goal: Adequacy of tissue perfusion will improve Outcome: Progressing   Problem: Education: Goal: Knowledge of General Education information will improve Description: Including pain rating scale, medication(s)/side effects and non-pharmacologic comfort measures Outcome: Progressing   Problem: Health Behavior/Discharge Planning: Goal: Ability to manage health-related needs will improve Outcome: Progressing   Problem: Clinical Measurements: Goal: Ability to maintain clinical measurements within normal limits will improve Outcome: Progressing Goal: Will remain free from infection Outcome: Progressing Goal: Diagnostic test results will improve Outcome: Progressing Goal: Respiratory complications will improve Outcome: Progressing Goal: Cardiovascular complication will be avoided Outcome: Progressing   Problem: Activity: Goal: Risk for activity intolerance will decrease Outcome: Progressing   Problem: Nutrition: Goal: Adequate nutrition will be maintained Outcome: Progressing   Problem: Coping: Goal: Level of anxiety will decrease Outcome: Progressing   Problem: Elimination: Goal: Will not experience complications related to bowel motility Outcome: Progressing Goal: Will not experience complications related to urinary retention Outcome: Progressing   Problem: Pain Managment: Goal: General experience of comfort will improve Outcome: Progressing   Problem: Safety: Goal: Ability to remain free from injury will improve Outcome: Progressing   Problem: Skin Integrity: Goal: Risk for impaired skin integrity will decrease Outcome: Progressing   

## 2023-02-11 NOTE — Progress Notes (Signed)
PROGRESS NOTE                                                                                                                                                                                                             Patient Demographics:    Adam Long, is a 47 y.o. male, DOB - [birth year], ZOX:[id number]  Outpatient Primary MD for the patient is [name], DO    LOS - 5  Admit date - 02/06/2023    Chief Complaint  Patient presents with   Post-op Problem       Brief Narrative (HPI from H&P)     47 y.o. male past medical history significant for anxiety coronary artery disease, essential hypertension diabetes mellitus type 2 mechanical aortic valve currently on Coumadin and Lovenox overlap, recently discharged from the hospital on 02/03/2023 for left BKA MRSA stump requiring revision (revision done by Dr. Lajoyce Corners on 01/29/2023, initial BKA was in 2022) surgical cultures confirm or say at home he was sent home on daptomycin with end of date 03/25/2023.   He went home and bumped his left BKA stump by mistake in his kitchen, subsequently started bleeding from the site came to the ER where he was diagnosed with significant blood loss related anemia and AKI and admitted to the hospital.   Subjective:   No significant events overnight, he denies any complaints today.  Assessment  & Plan :    Acute blood loss anemia at the left BKA stump site due to mechanical injury in the setting of Coumadin and Lovenox overlap: He had massive bleed, total 5 units of packed RBC given last on 02/07/2023 afternoon, he has been seen by orthopedics on board, continue to monitor CBC, bleeding seems to have stopped -Remains on heparin GTT, currently back on warfarin, very minimal amount of blood noted in the gauze this morning, will continue to monitor while on anticoagulation.  . -He received 1 unit PRBC yesterday with good response, hemoglobin is 9.1  today .   Acute kidney injury: Likely prerenal in the setting of ongoing ACE inhibitor use and severe anemia, transfuse, hold ACE, renal function has stabilized.  Hyponatremia: Improving.   Mechanical aortic valve repair in 2021: His INR goal is 2-3, remains on heparin drip, started on warfarin,  Pharmacy to dose   S/P BKA (below knee amputation),  left (HCC)/with history of left BKA MR stent stump abscess status post revision by due to on 01/29/2023, original left BKA was in 2022. Dr. Lajoyce Corners on board, continue wound care, his original left BKA was in 2022 followed by left BKA stump MRSA infection requiring revision by Dr. Lajoyce Corners on 01/29/2023, continue IV daptomycin with end date of 03/25/2023 > R Arm PICC.   Leukocytosis: Likely reactive likely due to severe acute blood loss, continue to monitor   Essential HTN (hypertension) Continue metoprolol, due to recent renal failure holding ACE inhibitor will add Norvasc instead.   Hyperlipidemia associated with type 2 diabetes mellitus (HCC) Continue statins.   Coronary artery disease: No anginal symptoms continue statins.   Anxiety: Continue Zoloft.   GERD: Continue Protonix.   Uncontrolled type 2 diabetes mellitus with hyperglycemia (HCC): Will allow him a diet, continue his long-acting insulin plus sensitive sliding scale, poor outpt control  Lab Results  Component Value Date   HGBA1C 13.4 (H) 01/24/2023   CBG (last 3)  Recent Labs    02/10/23 2110 02/11/23 0804 02/11/23 1132  GLUCAP 151* 127* 157*         Condition - Extremely Guarded  Family Communication  :  None  Code Status :  Full  Consults  :  Ortho  PUD Prophylaxis : PPI   Procedures  :            Disposition Plan  :    Status is: Inpatient   DVT Prophylaxis  : He remains on heparin GTT.  Place and maintain sequential compression device Start: 02/07/23 0929 Lab Results  Component Value Date   PLT 240 02/11/2023    Diet :  Diet Order              Diet Carb Modified Fluid consistency: Thin; Room service appropriate? Yes  Diet effective now                    Inpatient Medications  Scheduled Meds:  amLODipine  10 mg Oral Daily   Chlorhexidine Gluconate Cloth  6 each Topical Daily   docusate sodium  200 mg Oral BID   ezetimibe  10 mg Oral Daily   insulin aspart  0-15 Units Subcutaneous TID WC   insulin aspart  0-5 Units Subcutaneous QHS   insulin aspart  3 Units Subcutaneous TID WC   insulin detemir  12 Units Subcutaneous BID   metoprolol tartrate  25 mg Oral BID   pantoprazole  40 mg Oral Daily   polyethylene glycol  17 g Oral BID   sertraline  50 mg Oral Daily   warfarin  12.5 mg Oral ONCE-1600   Warfarin - Pharmacist Dosing Inpatient   Does not apply q1600   Continuous Infusions:  DAPTOmycin (CUBICIN) 900 mg in sodium chloride 0.9 % IVPB 900 mg (02/10/23 2128)   heparin 2,500 Units/hr (02/11/23 0506)   PRN Meds:.acetaminophen **OR** acetaminophen, methocarbamol, ondansetron **OR** ondansetron (ZOFRAN) IV, oxyCODONE, sodium chloride flush    Objective:   Vitals:   02/10/23 2323 02/11/23 0345 02/11/23 0805 02/11/23 1132  BP: 123/77 123/76 (!) 143/89   Pulse: 70 70 74 82  Resp: 20 18 19    Temp: 97.9 F (36.6 C) 98 F (36.7 C) 98.1 F (36.7 C)   TempSrc: Oral Oral Oral   SpO2:      Weight:      Height:        Wt Readings from Last 3 Encounters:  02/06/23 Marland Kitchen)  165.6 kg  02/02/23 (!) 168.1 kg  11/09/22 (!) 173.3 kg     Intake/Output Summary (Last 24 hours) at 02/11/2023 1228 Last data filed at 02/11/2023 0506 Gross per 24 hour  Intake 315 ml  Output 5100 ml  Net -4785 ml     Physical Exam  Awake Alert, Oriented X 3, No new F.N deficits, Normal affect Symmetrical Chest wall movement, Good air movement bilaterally, CTAB RRR,No Gallops,Rubs or new Murmurs, No Parasternal Heave +ve B.Sounds, Abd Soft, No tenderness, No rebound - guarding or rigidity. L BKA stump under pressure bandage, gauze  has been seen and examined with 3 small area of blood noted in the gauze.  R Arm PICC.   Data Review:    Recent Labs  Lab 02/06/23 1110 02/07/23 0335 02/07/23 1838 02/08/23 0426 02/09/23 0340 02/09/23 1736 02/10/23 0355 02/11/23 0339  WBC 16.2*   < > 9.9 8.5 6.6  --  6.4 6.4  HGB 4.4*   < > 6.8* 8.3* 7.7* 8.3* 7.9* 9.1*  HCT 14.9*   < > 20.9* 25.1* 24.3*  --  24.6* 28.4*  PLT 347   < > 240 223 211  --  207 240  MCV 89.8   < > 87.8 87.5 86.8  --  88.2 86.1  MCH 26.5   < > 28.6 28.9 27.5  --  28.3 27.6  MCHC 29.5*   < > 32.5 33.1 31.7  --  32.1 32.0  RDW 14.2   < > 14.4 14.6 14.5  --  14.1 13.9  LYMPHSABS 1.4  --   --  2.0 1.6  --  1.9 1.8  MONOABS 0.6  --   --  0.6 0.5  --  0.5 0.4  EOSABS 0.0  --   --  0.2 0.2  --  0.3 0.2  BASOSABS 0.0  --   --  0.1 0.0  --  0.1 0.0   < > = values in this interval not displayed.    Recent Labs  Lab 02/06/23 1110 02/07/23 0335 02/08/23 0426 02/09/23 0340 02/10/23 0355 02/11/23 0339  NA 131* 130* 135 135 133* 133*  K 3.9 3.7 3.9 3.8 4.0 4.2  CL 97* 99 105 105 99 101  CO2 17* 24 24 25 26 25   ANIONGAP 17* 7 6 5 8 7   GLUCOSE 415* 174* 142* 185* 153* 151*  BUN 33* 30* 20 12 9 9   CREATININE 2.38* 1.62* 1.08 0.97 0.86 0.87  AST 23  --   --   --   --   --   ALT 22  --   --   --   --   --   ALKPHOS 42  --   --   --   --   --   BILITOT 0.7  --   --   --   --   --   ALBUMIN 2.4*  --   --   --   --   --   LATICACIDVEN 5.5*  --   --   --   --   --   INR 1.5*  --   --   --   --  1.1  BNP  --  182.2* 449.6* 491.6* 292.8* 136.8*  MG  --  2.0 2.0 2.1 2.1 2.0  CALCIUM 8.0* 7.5* 7.8* 7.8* 8.0* 8.5*      Recent Labs  Lab 02/06/23 1110 02/07/23 0335 02/08/23 0426 02/09/23 0340 02/10/23 0355 02/11/23 0339  LATICACIDVEN 5.5*  --   --   --   --   --  INR 1.5*  --   --   --   --  1.1  BNP  --  182.2* 449.6* 491.6* 292.8* 136.8*  MG  --  2.0 2.0 2.1 2.1 2.0  CALCIUM 8.0* 7.5* 7.8* 7.8* 8.0* 8.5*   Micro Results Recent Results (from  the past 240 hour(s))  Culture, blood (Routine X 2) w Reflex to ID Panel     Status: None (Preliminary result)   Collection Time: 02/08/23  4:27 AM   Specimen: BLOOD LEFT HAND  Result Value Ref Range Status   Specimen Description BLOOD LEFT HAND  Final   Special Requests   Final    BOTTLES DRAWN AEROBIC AND ANAEROBIC Blood Culture adequate volume   Culture   Final    NO GROWTH 3 DAYS Performed at [facility], [address] [place], [place] [address]    Report Status PENDING  Incomplete  Culture, blood (Routine X 2) w Reflex to ID Panel     Status: None (Preliminary result)   Collection Time: 02/08/23  4:29 AM   Specimen: BLOOD LEFT HAND  Result Value Ref Range Status   Specimen Description BLOOD LEFT HAND  Final   Special Requests   Final    BOTTLES DRAWN AEROBIC AND ANAEROBIC Blood Culture results may not be optimal due to an excessive volume of blood received in culture bottles   Culture   Final    NO GROWTH 3 DAYS Performed at [facility], [address], [place], [place] [address]    Report Status PENDING  Incomplete    Radiology Reports No results found.    Signature  -   Huey Bienenstock M.D on 02/11/2023 at 12:28 PM   -  To page go to [email or web address]

## 2023-02-12 DIAGNOSIS — D62 Acute posthemorrhagic anemia: Secondary | ICD-10-CM | POA: Diagnosis not present

## 2023-02-12 LAB — CBC WITH DIFFERENTIAL/PLATELET
Abs Immature Granulocytes: 0.04 10*3/uL (ref 0.00–0.07)
Basophils Absolute: 0 10*3/uL (ref 0.0–0.1)
Basophils Relative: 1 %
Eosinophils Absolute: 0.3 10*3/uL (ref 0.0–0.5)
Eosinophils Relative: 4 %
HCT: 28.9 % — ABNORMAL LOW (ref 39.0–52.0)
Hemoglobin: 9.2 g/dL — ABNORMAL LOW (ref 13.0–17.0)
Immature Granulocytes: 1 %
Lymphocytes Relative: 29 %
Lymphs Abs: 1.7 10*3/uL (ref 0.7–4.0)
MCH: 27.6 pg (ref 26.0–34.0)
MCHC: 31.8 g/dL (ref 30.0–36.0)
MCV: 86.8 fL (ref 80.0–100.0)
Monocytes Absolute: 0.5 10*3/uL (ref 0.1–1.0)
Monocytes Relative: 8 %
Neutro Abs: 3.2 10*3/uL (ref 1.7–7.7)
Neutrophils Relative %: 57 %
Platelets: 233 10*3/uL (ref 150–400)
RBC: 3.33 MIL/uL — ABNORMAL LOW (ref 4.22–5.81)
RDW: 13.8 % (ref 11.5–15.5)
WBC: 5.7 10*3/uL (ref 4.0–10.5)
nRBC: 0 % (ref 0.0–0.2)

## 2023-02-12 LAB — BASIC METABOLIC PANEL
Anion gap: 8 (ref 5–15)
BUN: 10 mg/dL (ref 6–20)
CO2: 26 mmol/L (ref 22–32)
Calcium: 8.7 mg/dL — ABNORMAL LOW (ref 8.9–10.3)
Chloride: 102 mmol/L (ref 98–111)
Creatinine, Ser: 0.87 mg/dL (ref 0.61–1.24)
GFR, Estimated: >60 mL/min (ref >60)
Glucose, Bld: 148 mg/dL — ABNORMAL HIGH (ref 70–99)
Potassium: 4 mmol/L (ref 3.5–5.1)
Sodium: 136 mmol/L (ref 135–145)

## 2023-02-12 LAB — CULTURE, BLOOD (ROUTINE X 2): Special Requests: ADEQUATE

## 2023-02-12 LAB — GLUCOSE, CAPILLARY
Glucose-Capillary: 152 mg/dL — ABNORMAL HIGH (ref 70–99)
Glucose-Capillary: 153 mg/dL — ABNORMAL HIGH (ref 70–99)
Glucose-Capillary: 129 mg/dL — ABNORMAL HIGH (ref 70–99)
Glucose-Capillary: 136 mg/dL — ABNORMAL HIGH (ref 70–99)

## 2023-02-12 LAB — PROTIME-INR
INR: 1.1 (ref 0.8–1.2)
Prothrombin Time: 14.4 seconds (ref 11.4–15.2)

## 2023-02-12 LAB — HEPARIN LEVEL (UNFRACTIONATED)
Heparin Unfractionated: 0.27 IU/mL — ABNORMAL LOW (ref 0.30–0.70)
Heparin Unfractionated: 0.29 IU/mL — ABNORMAL LOW (ref 0.30–0.70)

## 2023-02-12 MED ORDER — WARFARIN SODIUM 2.5 MG PO TABS
12.5000 mg | ORAL_TABLET | Freq: Once | ORAL | Status: AC
  Administered 2023-02-13: 12.5 mg via ORAL
  Filled 2023-02-12: qty 1

## 2023-02-12 NOTE — Progress Notes (Signed)
ANTICOAGULATION CONSULT NOTE  Pharmacy Consult for Heparin;  and 5/16: Resume Warfarin Indication:  mechanical valve; holding warfarin/enoxaparin given bleeding  No Known Allergies  Patient Measurements: Height: 5\' 10"  (177.8 cm) Weight: (!) 165.6 kg (365 lb) IBW/kg (Calculated) : 73 Heparin Dosing Weight: 113.5 kg  Vital Signs: Temp: 98.3 F (36.8 C) (05/18 0008) Temp Source: Oral (05/18 0008) BP: 120/82 (05/18 0008) Pulse Rate: 76 (05/18 0008)  Labs: Recent Labs    02/10/23 0355 02/10/23 1023 02/11/23 0339 02/12/23 0304  HGB 7.9*  --  9.1* 9.2*  HCT 24.6*  --  28.4* 28.9*  PLT 207  --  240 233  LABPROT  --   --  14.2 14.4  INR  --   --  1.1 1.1  HEPARINUNFRC 0.39 0.32 0.45 0.27*  CREATININE 0.86  --  0.87 0.87     Estimated Creatinine Clearance: 165.1 mL/min (by C-G formula based on SCr of 0.87 mg/dL).   Assessment: 24 yom with a history of T2DM, CAD, HTN, AVR, dyslipidemia. Recent admission with left BKA stump abscess s/p surgical I&D. Discharged from that encounter with enoxaparin/warfarin bridge. Patient is presenting with wound check / bleeding. Heparin per pharmacy (to start 5/13am) consult placed for  mechanical AVR in the setting of holding warfarin/enoxaparin given wound site bleeding .  Home warfarin regimen is 10mg  T/R/Sa/Su and 5mg  on MWF and was bridging with 160 mg q12h SQ enoxaparin. Patient reports last warfarin dose 5/10 and last enoxaparin dose 5/11am.  Heparin level slightly subtherapeutic (0.27) on infusion at 2500 units/hr. No issues with line or bleeding reported per RN.  Goal of Therapy:  Heparin level 0.3-0.5 units/ml INR goal 2-3 Monitor platelets by anticoagulation protocol: Yes   Plan:  Increase heparin drip to 2600 units/hr F/u 6 hr heparin level  Christoper Fabian, PharmD, BCPS Please see amion for complete clinical pharmacist phone list 02/12/2023 5:34 AM

## 2023-02-12 NOTE — Progress Notes (Signed)
PROGRESS NOTE                                                                                                                                                                                                             Patient Demographics:    Adam Long, is a 47 y.o. male, DOB - May 18, 1976, NFA:[id number]  Outpatient Primary MD for the patient is [name], DO    LOS - 6  Admit date - 02/06/2023    Chief Complaint  Patient presents with   Post-op Problem       Brief Narrative (HPI from H&P)     47 y.o. male past medical history significant for anxiety coronary artery disease, essential hypertension diabetes mellitus type 2 mechanical aortic valve currently on Coumadin and Lovenox overlap, recently discharged from the hospital on 02/03/2023 for left BKA MRSA stump requiring revision (revision done by Dr. Lajoyce Corners on 01/29/2023, initial BKA was in 2022) surgical cultures confirm or say at home he was sent home on daptomycin with end of date 03/25/2023.   He went home and bumped his left BKA stump by mistake in his kitchen, subsequently started bleeding from the site came to the ER where he was diagnosed with significant blood loss related anemia and AKI and admitted to the hospital.   Subjective:  No significant events overnight, he denies any complaints today, no wheezing or bleed from stump site.    Assessment  & Plan :    Acute blood loss anemia at the left BKA stump site due to mechanical injury in the setting of Coumadin and Lovenox overlap: He had massive bleed, total 5 units of packed RBC given last on 02/07/2023, as well as another unit on 5/17.   -Remains on heparin GTT, currently back on warfarin, no further evidence of bleed from stump site over last 24 hours . -Patient was transfused 1 unit PRBC yesterday with good response no evidence of blood very minimal amount of blood noted in the gauze this morning, will  continue to monitor while on anticoagulation.  . -He received 1 unit PRBC yesterday with good response, hemoglobin is 9.1 today .   Acute kidney injury: Likely prerenal in the setting of ongoing ACE inhibitor use and severe anemia, transfuse, hold ACE, renal function has stabilized.  Hyponatremia: Improving.   Mechanical aortic valve  repair in 2021: His INR goal is 2-3, remains on heparin drip, started on warfarin,  Pharmacy to dose   S/P BKA (below knee amputation), left (HCC)/with history of left BKA MR stent stump abscess status post revision by due to on 01/29/2023, original left BKA was in 2022. Dr. Lajoyce Corners on board, continue wound care, his original left BKA was in 2022 followed by left BKA stump MRSA infection requiring revision by Dr. Lajoyce Corners on 01/29/2023, continue IV daptomycin with end date of 03/25/2023 > R Arm PICC.   Leukocytosis: Likely reactive likely due to severe acute blood loss, continue to monitor   Essential HTN (hypertension) Continue metoprolol, due to recent renal failure holding ACE inhibitor will add Norvasc instead.   Hyperlipidemia associated with type 2 diabetes mellitus (HCC) Continue statins.   Coronary artery disease: No anginal symptoms continue statins.   Anxiety: Continue Zoloft.   GERD: Continue Protonix.   Uncontrolled type 2 diabetes mellitus with hyperglycemia (HCC): Will allow him a diet, continue his long-acting insulin plus sensitive sliding scale, poor outpt control  Lab Results  Component Value Date   HGBA1C 13.4 (H) 01/24/2023   CBG (last 3)  Recent Labs    02/11/23 2047 02/12/23 0737 02/12/23 1123  GLUCAP 139* 152* 153*         Condition - Extremely Guarded  Family Communication  :  None  Code Status :  Full  Consults  :  Ortho  PUD Prophylaxis : PPI   Procedures  :            Disposition Plan  :    Status is: Inpatient   DVT Prophylaxis  : He remains on heparin GTT.  Place and maintain sequential  compression device Start: 02/07/23 0929 Lab Results  Component Value Date   PLT 233 02/12/2023    Diet :  Diet Order             Diet Carb Modified Fluid consistency: Thin; Room service appropriate? Yes  Diet effective now                    Inpatient Medications  Scheduled Meds:  amLODipine  10 mg Oral Daily   Chlorhexidine Gluconate Cloth  6 each Topical Daily   docusate sodium  200 mg Oral BID   ezetimibe  10 mg Oral Daily   insulin aspart  0-15 Units Subcutaneous TID WC   insulin aspart  0-5 Units Subcutaneous QHS   insulin aspart  3 Units Subcutaneous TID WC   insulin detemir  12 Units Subcutaneous BID   metoprolol tartrate  25 mg Oral BID   pantoprazole  40 mg Oral Daily   polyethylene glycol  17 g Oral BID   sertraline  50 mg Oral Daily   Warfarin - Pharmacist Dosing Inpatient   Does not apply q1600   Continuous Infusions:  DAPTOmycin (CUBICIN) 900 mg in sodium chloride 0.9 % IVPB Stopped (02/12/23 0858)   heparin 2,600 Units/hr (02/12/23 0607)   PRN Meds:.acetaminophen **OR** acetaminophen, methocarbamol, ondansetron **OR** ondansetron (ZOFRAN) IV, oxyCODONE, sodium chloride flush    Objective:   Vitals:   02/11/23 2000 02/12/23 0008 02/12/23 0739 02/12/23 1125  BP: 118/84 120/82 135/81 125/74  Pulse: 69 76    Resp: 19 17  18   Temp: 97.8 F (36.6 C) 98.3 F (36.8 C)  98.3 F (36.8 C)  TempSrc: Oral Oral  Oral  SpO2: 96% 97%    Weight:      Height:  Wt Readings from Last 3 Encounters:  02/06/23 (!) 165.6 kg  02/02/23 (!) 168.1 kg  11/09/22 (!) 173.3 kg     Intake/Output Summary (Last 24 hours) at 02/12/2023 1315 Last data filed at 02/12/2023 1126 Gross per 24 hour  Intake 1969.17 ml  Output 3950 ml  Net -1980.83 ml     Physical Exam  Awake Alert, Oriented X 3, No new F.N deficits, Normal affect Symmetrical Chest wall movement, Good air movement bilaterally, CTAB RRR,No Gallops,Rubs or new Murmurs, No Parasternal Heave +ve  B.Sounds, Abd Soft, No tenderness, No rebound - guarding or rigidity. L BKA stump under pressure bandage, gauze has been seen and examined no blood noted in the gauze.  R Arm PICC.   Data Review:    Recent Labs  Lab 02/08/23 0426 02/09/23 0340 02/09/23 1736 02/10/23 0355 02/11/23 0339 02/12/23 0304  WBC 8.5 6.6  --  6.4 6.4 5.7  HGB 8.3* 7.7* 8.3* 7.9* 9.1* 9.2*  HCT 25.1* 24.3*  --  24.6* 28.4* 28.9*  PLT 223 211  --  207 240 233  MCV 87.5 86.8  --  88.2 86.1 86.8  MCH 28.9 27.5  --  28.3 27.6 27.6  MCHC 33.1 31.7  --  32.1 32.0 31.8  RDW 14.6 14.5  --  14.1 13.9 13.8  LYMPHSABS 2.0 1.6  --  1.9 1.8 1.7  MONOABS 0.6 0.5  --  0.5 0.4 0.5  EOSABS 0.2 0.2  --  0.3 0.2 0.3  BASOSABS 0.1 0.0  --  0.1 0.0 0.0    Recent Labs  Lab 02/06/23 1110 02/07/23 0335 02/08/23 0426 02/09/23 0340 02/10/23 0355 02/11/23 0339 02/12/23 0304  NA 131* 130* 135 135 133* 133* 136  K 3.9 3.7 3.9 3.8 4.0 4.2 4.0  CL 97* 99 105 105 99 101 102  CO2 17* 24 24 25 26 25 26   ANIONGAP 17* 7 6 5 8 7 8   GLUCOSE 415* 174* 142* 185* 153* 151* 148*  BUN 33* 30* 20 12 9 9 10   CREATININE 2.38* 1.62* 1.08 0.97 0.86 0.87 0.87  AST 23  --   --   --   --   --   --   ALT 22  --   --   --   --   --   --   ALKPHOS 42  --   --   --   --   --   --   BILITOT 0.7  --   --   --   --   --   --   ALBUMIN 2.4*  --   --   --   --   --   --   LATICACIDVEN 5.5*  --   --   --   --   --   --   INR 1.5*  --   --   --   --  1.1 1.1  BNP  --  182.2* 449.6* 491.6* 292.8* 136.8*  --   MG  --  2.0 2.0 2.1 2.1 2.0  --   CALCIUM 8.0* 7.5* 7.8* 7.8* 8.0* 8.5* 8.7*      Recent Labs  Lab 02/06/23 1110 02/07/23 0335 02/08/23 0426 02/09/23 0340 02/10/23 0355 02/11/23 0339 02/12/23 0304  LATICACIDVEN 5.5*  --   --   --   --   --   --   INR 1.5*  --   --   --   --  1.1 1.1  BNP  --  182.2* 449.6* 491.6* 292.8* 136.8*  --   MG  --  2.0 2.0 2.1 2.1 2.0  --   CALCIUM 8.0* 7.5* 7.8* 7.8* 8.0* 8.5* 8.7*   Micro  Results Recent Results (from the past 240 hour(s))  Culture, blood (Routine X 2) w Reflex to ID Panel     Status: None (Preliminary result)   Collection Time: 02/08/23  4:27 AM   Specimen: BLOOD LEFT HAND  Result Value Ref Range Status   Specimen Description BLOOD LEFT HAND  Final   Special Requests   Final    BOTTLES DRAWN AEROBIC AND ANAEROBIC Blood Culture adequate volume   Culture   Final    NO GROWTH 4 DAYS Performed at [facility], [address] [place], [place] [address]    Report Status PENDING  Incomplete  Culture, blood (Routine X 2) w Reflex to ID Panel     Status: None (Preliminary result)   Collection Time: 02/08/23  4:29 AM   Specimen: BLOOD LEFT HAND  Result Value Ref Range Status   Specimen Description BLOOD LEFT HAND  Final   Special Requests   Final    BOTTLES DRAWN AEROBIC AND ANAEROBIC Blood Culture results may not be optimal due to an excessive volume of blood received in culture bottles   Culture   Final    NO GROWTH 4 DAYS Performed at [facility], [address], [place], [place] [address]    Report Status PENDING  Incomplete    Radiology Reports No results found.    Signature  -   Huey Bienenstock M.D on 02/12/2023 at 1:15 PM   -  To page go to [email or web address]

## 2023-02-12 NOTE — Progress Notes (Signed)
Pharmacy Antibiotic Note  [name] is a 47 y.o. male for which pharmacy has been consulted for daptomycin dosing for Left BKA MRSA stump abscess s/p irrigation and debridement w/ wound VAC and s/p left BKA by Dr. Lajoyce Corners 5/4. Previous cultures with MRSA ID planned for daptomycin OPAT (EOT 6/28). Pharmacy consulted to continue Daptomycin for MRSA stump abscess.  Patient remains afebrile, WBC wnl, no s/s of worsening infection. Scr 0.87  CK 5/13 44 5/14 31  Plan: Continue Daptomycin 8mg /kg daily Monitor clinical course, WBC, cultures Weekly CK while on Dapto  Height: 5\' 10"  (177.8 cm) Weight: (!) 165.6 kg (365 lb) IBW/kg (Calculated) : 73  Temp (24hrs), Avg:98.3 F (36.8 C), Min:97.8 F (36.6 C), Max:99 F (37.2 C)  Recent Labs  Lab 02/06/23 1110 02/07/23 0335 02/08/23 0426 02/09/23 0340 02/10/23 0355 02/11/23 0339 02/12/23 0304  WBC 16.2*   < > 8.5 6.6 6.4 6.4 5.7  CREATININE 2.38*   < > 1.08 0.97 0.86 0.87 0.87  LATICACIDVEN 5.5*  --   --   --   --   --   --    < > = values in this interval not displayed.    Estimated Creatinine Clearance: 165.1 mL/min (by C-G formula based on SCr of 0.87 mg/dL).    No Known Allergies  Antimicrobials this admission: Daptomycin 5/7 >> (6/28)  Dose adjustments this admission: N/a  Microbiology results: 5/14 BCx: ngtd  Thank you for allowing pharmacy to be a part of this patient's care.  Rennis Petty, PharmD PGY1 Pharmacy Resident 02/12/2023 7:56 AM

## 2023-02-12 NOTE — Progress Notes (Signed)
ANTICOAGULATION CONSULT NOTE  Pharmacy Consult for Heparin;  and 5/16: Resume Warfarin Indication: Mechanical aortic valve  No Known Allergies  Patient Measurements: Height: 5\' 10"  (177.8 cm) Weight: (!) 165.6 kg (365 lb) IBW/kg (Calculated) : 73 Heparin Dosing Weight: 113.5 kg  Vital Signs: Temp: 97.7 F (36.5 C) (05/18 1538) Temp Source: Oral (05/18 1538) BP: 113/89 (05/18 1538)  Labs: Recent Labs    02/10/23 0355 02/10/23 1023 02/11/23 0339 02/12/23 0304  HGB 7.9*  --  9.1* 9.2*  HCT 24.6*  --  28.4* 28.9*  PLT 207  --  240 233  LABPROT  --   --  14.2 14.4  INR  --   --  1.1 1.1  HEPARINUNFRC 0.39 0.32 0.45 0.27*  CREATININE 0.86  --  0.87 0.87    Estimated Creatinine Clearance: 165.1 mL/min (by C-G formula based on SCr of 0.87 mg/dL).   Assessment: 47 yom with a history of T2DM, CAD, HTN, AVR, dyslipidemia. Recent admission with left BKA stump abscess s/p surgical I&D. Discharged from that encounter with enoxaparin/warfarin bridge. Patient is presenting with wound check / bleeding. Pharmacy consulted to re-initiate Warfarin with heparin bridge on 5/16 as bleeding has stabilized.  Home warfarin regimen is 10mg  T/R/Sa/Su and 5mg  on MWF and was bridging with 160 mg q12h SQ enoxaparin. Patient reports last warfarin dose 5/10 and last enoxaparin dose 5/11am.  Warfarin 12.5 mg dose administered 5/17,  INR today is 1.1 Hgb 9.2, stable, Plt 233 Has received blood transfusions 5/12, 5/13 and 5/16 this admission.  No stump bleeding or other bleeding reported.  Will likely require a few higher doses to move INR upward toward goal INR 2-3.     Heparin level 0.29 is just subtherapeutic on 2600 units/hr.  No issues with infusion or bleeding per RN.   Goal of Therapy:  Heparin level 0.3-0.5 units/ml INR goal 2-3 Monitor platelets by anticoagulation protocol: Yes   Plan:  Warfarin 12.5mg  x1 today Increase heparin to 2700 units/hr Daily heparin level , INR and  CBC Monitor for s/sx of bleeding  Thank you for allowing pharmacy to be part of this patients care team.  Alphia Moh, PharmD, BCPS, BCCP Clinical Pharmacist  Please check AMION for all [facility] Pharmacy phone numbers After 10:00 PM, call Main Pharmacy [phone]

## 2023-02-12 NOTE — Progress Notes (Signed)
ANTICOAGULATION CONSULT NOTE  Pharmacy Consult for Heparin;  and 5/16: Resume Warfarin Indication: Mechanical aortic valve  No Known Allergies  Patient Measurements: Height: 5\' 10"  (177.8 cm) Weight: (!) 165.6 kg (365 lb) IBW/kg (Calculated) : 73 Heparin Dosing Weight: 113.5 kg  Vital Signs: Temp: 98.3 F (36.8 C) (05/18 0008) Temp Source: Oral (05/18 0008) BP: 120/82 (05/18 0008) Pulse Rate: 76 (05/18 0008)  Labs: Recent Labs    02/10/23 0355 02/10/23 1023 02/11/23 0339 02/12/23 0304  HGB 7.9*  --  9.1* 9.2*  HCT 24.6*  --  28.4* 28.9*  PLT 207  --  240 233  LABPROT  --   --  14.2 14.4  INR  --   --  1.1 1.1  HEPARINUNFRC 0.39 0.32 0.45 0.27*  CREATININE 0.86  --  0.87 0.87     Estimated Creatinine Clearance: 165.1 mL/min (by C-G formula based on SCr of 0.87 mg/dL).   Assessment: 38 yom with a history of T2DM, CAD, HTN, AVR, dyslipidemia. Recent admission with left BKA stump abscess s/p surgical I&D. Discharged from that encounter with enoxaparin/warfarin bridge. Patient is presenting with wound check / bleeding. Pharmacy consulted to re-initiate Warfarin with heparin bridge on 5/16 as bleeding has stabilized.  Home warfarin regimen is 10mg  T/R/Sa/Su and 5mg  on MWF and was bridging with 160 mg q12h SQ enoxaparin. Patient reports last warfarin dose 5/10 and last enoxaparin dose 5/11am.  Warfarin 12.5 mg dose administered 5/17,  INR today is 1.1 Hgb 9.2, stable, Plt 233 Has received blood transfusions 5/12, 5/13 and 5/16 this admission.  No stump bleeding or other bleeding reported.  Will likely require a few higher doses to move INR upward toward goal INR 2-3.     Goal of Therapy:  Heparin level 0.3-0.5 units/ml INR goal 2-3 Monitor platelets by anticoagulation protocol: Yes   Plan:  Warfarin 12.5mg  x1 today Continue heparin at 2600 units/hr Follow up heparin level this afternoon Daily heparin level , INR and CBC Monitor for s/sx of bleeding  Thank you  for allowing pharmacy to be part of this patients care team.  Rennis Petty, PharmD PGY1 Pharmacy Resident 02/12/2023 7:43 AM

## 2023-02-12 NOTE — Plan of Care (Signed)
  Problem: Education: Goal: Knowledge of the prescribed therapeutic regimen will improve Outcome: Progressing   Problem: Activity: Goal: Ability to perform//tolerate increased activity and mobilize with assistive devices will improve Outcome: Progressing   Problem: Pain Management: Goal: Pain level will decrease with appropriate interventions Outcome: Progressing   Problem: Skin Integrity: Goal: Demonstration of wound healing without infection will improve Outcome: Progressing   Problem: Education: Goal: Ability to describe self-care measures that may prevent or decrease complications (Diabetes Survival Skills Education) will improve Outcome: Progressing   Problem: Fluid Volume: Goal: Ability to maintain a balanced intake and output will improve Outcome: Progressing   Problem: Health Behavior/Discharge Planning: Goal: Ability to manage health-related needs will improve Outcome: Progressing   Problem: Metabolic: Goal: Ability to maintain appropriate glucose levels will improve Outcome: Progressing   Problem: Pain Managment: Goal: General experience of comfort will improve Outcome: Progressing

## 2023-02-13 DIAGNOSIS — D62 Acute posthemorrhagic anemia: Secondary | ICD-10-CM | POA: Diagnosis not present

## 2023-02-13 LAB — CBC
HCT: 29.2 % — ABNORMAL LOW (ref 39.0–52.0)
Hemoglobin: 9.3 g/dL — ABNORMAL LOW (ref 13.0–17.0)
MCH: 27.4 pg (ref 26.0–34.0)
MCHC: 31.8 g/dL (ref 30.0–36.0)
MCV: 86.1 fL (ref 80.0–100.0)
Platelets: 220 10*3/uL (ref 150–400)
RBC: 3.39 MIL/uL — ABNORMAL LOW (ref 4.22–5.81)
RDW: 13.9 % (ref 11.5–15.5)
WBC: 5.1 10*3/uL (ref 4.0–10.5)
nRBC: 0 % (ref 0.0–0.2)

## 2023-02-13 LAB — CULTURE, BLOOD (ROUTINE X 2): Culture: NO GROWTH

## 2023-02-13 LAB — GLUCOSE, CAPILLARY
Glucose-Capillary: 161 mg/dL — ABNORMAL HIGH (ref 70–99)
Glucose-Capillary: 222 mg/dL — ABNORMAL HIGH (ref 70–99)
Glucose-Capillary: 179 mg/dL — ABNORMAL HIGH (ref 70–99)
Glucose-Capillary: 150 mg/dL — ABNORMAL HIGH (ref 70–99)

## 2023-02-13 LAB — HEPARIN LEVEL (UNFRACTIONATED)
Heparin Unfractionated: 0.45 IU/mL (ref 0.30–0.70)
Heparin Unfractionated: 0.43 IU/mL (ref 0.30–0.70)

## 2023-02-13 LAB — PROTIME-INR
INR: 1.1 (ref 0.8–1.2)
Prothrombin Time: 14.4 seconds (ref 11.4–15.2)

## 2023-02-13 NOTE — Progress Notes (Signed)
PROGRESS NOTE                                                                                                                                                                                                             Patient Demographics:    Adam Long, is a 47 y.o. male, DOB - 1975/11/21, FAO:[id number]  Outpatient Primary MD for the patient is [name], DO    LOS - 7  Admit date - 02/06/2023    Chief Complaint  Patient presents with   Post-op Problem       Brief Narrative (HPI from H&P)     47 y.o. male past medical history significant for anxiety coronary artery disease, essential hypertension diabetes mellitus type 2 mechanical aortic valve currently on Coumadin and Lovenox overlap, recently discharged from the hospital on 02/03/2023 for left BKA MRSA stump requiring revision (revision done by Dr. Lajoyce Corners on 01/29/2023, initial BKA was in 2022) surgical cultures confirm or say at home he was sent home on daptomycin with end of date 03/25/2023.   He went home and bumped his left BKA stump by mistake in his kitchen, subsequently started bleeding from the site came to the ER where he was diagnosed with significant blood loss related anemia and AKI and admitted to the hospital.   Subjective:  No significant events overnight, he denies any complaints, no oozing or bleeding from stump site  Assessment  & Plan :    Acute blood loss anemia at the left BKA stump site due to mechanical injury in the setting of Coumadin and Lovenox overlap: He had massive bleed, total 5 units of packed RBC given last on 02/07/2023, as well as another unit on 5/17.   -Remains on heparin GTT, currently back on warfarin, no further evidence of bleed from stump site over last 24 hours . -Patient was transfused 1 unit PRBC yesterday with good response no evidence of blood very minimal amount of blood noted in the gauze this morning, will continue to  monitor while on anticoagulation.  . -He received 1 unit PRBC yesterday with good response, hemoglobin is 9.1 today  -So far hemoglobin remained stable, continue with heparin GTT bridge till INR is therapeutic.  Acute kidney injury: Likely prerenal in the setting of ongoing ACE inhibitor use and severe anemia, transfuse, hold ACE, renal function  has stabilized.  Hyponatremia: Improving.   Mechanical aortic valve repair in 2021: His INR goal is 2-3, remains on heparin drip, started on warfarin,  Pharmacy to dose   S/P BKA (below knee amputation), left (HCC)/with history of left BKA MR stent stump abscess status post revision by due to on 01/29/2023, original left BKA was in 2022. Dr. Lajoyce Corners on board, continue wound care, his original left BKA was in 2022 followed by left BKA stump MRSA infection requiring revision by Dr. Lajoyce Corners on 01/29/2023, continue IV daptomycin with end date of 03/25/2023 > R Arm PICC.   Leukocytosis: Likely reactive likely due to severe acute blood loss, continue to monitor   Essential HTN (hypertension) Continue metoprolol, due to recent renal failure holding ACE inhibitor will add Norvasc instead.   Hyperlipidemia associated with type 2 diabetes mellitus (HCC) Continue statins.   Coronary artery disease: No anginal symptoms continue statins.   Anxiety: Continue Zoloft.   GERD: Continue Protonix.   Uncontrolled type 2 diabetes mellitus with hyperglycemia (HCC): Continue with carb modified diet, continue his long-acting insulin plus sensitive sliding scale, poor outpt control  Lab Results  Component Value Date   HGBA1C 13.4 (H) 01/24/2023   CBG (last 3)  Recent Labs    02/12/23 2035 02/13/23 0754 02/13/23 1154  GLUCAP 136* 161* 222*         Condition - Extremely Guarded  Family Communication  :  None  Code Status :  Full  Consults  :  Ortho  PUD Prophylaxis : PPI   Procedures  :            Disposition Plan  :    Status is:  Inpatient   DVT Prophylaxis  : He remains on heparin GTT.  Place and maintain sequential compression device Start: 02/07/23 0929 Lab Results  Component Value Date   PLT 220 02/13/2023    Diet :  Diet Order             Diet Carb Modified Fluid consistency: Thin; Room service appropriate? Yes  Diet effective now                    Inpatient Medications  Scheduled Meds:  amLODipine  10 mg Oral Daily   Chlorhexidine Gluconate Cloth  6 each Topical Daily   docusate sodium  200 mg Oral BID   ezetimibe  10 mg Oral Daily   insulin aspart  0-15 Units Subcutaneous TID WC   insulin aspart  0-5 Units Subcutaneous QHS   insulin aspart  3 Units Subcutaneous TID WC   insulin detemir  12 Units Subcutaneous BID   metoprolol tartrate  25 mg Oral BID   pantoprazole  40 mg Oral Daily   polyethylene glycol  17 g Oral BID   sertraline  50 mg Oral Daily   warfarin  12.5 mg Oral ONCE-1600   Warfarin - Pharmacist Dosing Inpatient   Does not apply q1600   Continuous Infusions:  DAPTOmycin (CUBICIN) 900 mg in sodium chloride 0.9 % IVPB 900 mg (02/12/23 2004)   heparin 2,700 Units/hr (02/13/23 0707)   PRN Meds:.acetaminophen **OR** acetaminophen, methocarbamol, ondansetron **OR** ondansetron (ZOFRAN) IV, oxyCODONE, sodium chloride flush    Objective:   Vitals:   02/12/23 2200 02/13/23 0000 02/13/23 0200 02/13/23 0757  BP:    134/87  Pulse: 88 79 77 82  Resp: (!) 22 19 20    Temp:    98.1 F (36.7 C)  TempSrc:    Oral  SpO2: 96% 95% 96%   Weight:      Height:        Wt Readings from Last 3 Encounters:  02/06/23 (!) 165.6 kg  02/02/23 (!) 168.1 kg  11/09/22 (!) 173.3 kg     Intake/Output Summary (Last 24 hours) at 02/13/2023 1332 Last data filed at 02/13/2023 1239 Gross per 24 hour  Intake --  Output 3400 ml  Net -3400 ml     Physical Exam  Awake Alert, Oriented X 3, No new F.N deficits, Normal affect Symmetrical Chest wall movement, Good air movement bilaterally,  CTAB RRR,No Gallops,Rubs or new Murmurs, No Parasternal Heave +ve B.Sounds, Abd Soft, No tenderness, No rebound - guarding or rigidity. L BKA stump under pressure bandage, gauze has been seen and examined no blood noted in the gauze.  R Arm PICC.   Data Review:    Recent Labs  Lab 02/08/23 0426 02/09/23 0340 02/09/23 1736 02/10/23 0355 02/11/23 0339 02/12/23 0304 02/13/23 0440  WBC 8.5 6.6  --  6.4 6.4 5.7 5.1  HGB 8.3* 7.7* 8.3* 7.9* 9.1* 9.2* 9.3*  HCT 25.1* 24.3*  --  24.6* 28.4* 28.9* 29.2*  PLT 223 211  --  207 240 233 220  MCV 87.5 86.8  --  88.2 86.1 86.8 86.1  MCH 28.9 27.5  --  28.3 27.6 27.6 27.4  MCHC 33.1 31.7  --  32.1 32.0 31.8 31.8  RDW 14.6 14.5  --  14.1 13.9 13.8 13.9  LYMPHSABS 2.0 1.6  --  1.9 1.8 1.7  --   MONOABS 0.6 0.5  --  0.5 0.4 0.5  --   EOSABS 0.2 0.2  --  0.3 0.2 0.3  --   BASOSABS 0.1 0.0  --  0.1 0.0 0.0  --     Recent Labs  Lab 02/07/23 0335 02/08/23 0426 02/09/23 0340 02/10/23 0355 02/11/23 0339 02/12/23 0304 02/13/23 0440  NA 130* 135 135 133* 133* 136  --   K 3.7 3.9 3.8 4.0 4.2 4.0  --   CL 99 105 105 99 101 102  --   CO2 24 24 25 26 25 26   --   ANIONGAP 7 6 5 8 7 8   --   GLUCOSE 174* 142* 185* 153* 151* 148*  --   BUN 30* 20 12 9 9 10   --   CREATININE 1.62* 1.08 0.97 0.86 0.87 0.87  --   INR  --   --   --   --  1.1 1.1 1.1  BNP 182.2* 449.6* 491.6* 292.8* 136.8*  --   --   MG 2.0 2.0 2.1 2.1 2.0  --   --   CALCIUM 7.5* 7.8* 7.8* 8.0* 8.5* 8.7*  --       Recent Labs  Lab 02/07/23 0335 02/08/23 0426 02/09/23 0340 02/10/23 0355 02/11/23 0339 02/12/23 0304 02/13/23 0440  INR  --   --   --   --  1.1 1.1 1.1  BNP 182.2* 449.6* 491.6* 292.8* 136.8*  --   --   MG 2.0 2.0 2.1 2.1 2.0  --   --   CALCIUM 7.5* 7.8* 7.8* 8.0* 8.5* 8.7*  --    Micro Results Recent Results (from the past 240 hour(s))  Culture, blood (Routine X 2) w Reflex to ID Panel     Status: None   Collection Time: 02/08/23  4:27 AM   Specimen: BLOOD  LEFT HAND  Result Value Ref Range Status   Specimen Description  BLOOD LEFT HAND  Final   Special Requests   Final    BOTTLES DRAWN AEROBIC AND ANAEROBIC Blood Culture adequate volume   Culture   Final    NO GROWTH 5 DAYS Performed at [facility], [address] [place], [place] [address]    Report Status 02/13/2023 FINAL  Final  Culture, blood (Routine X 2) w Reflex to ID Panel     Status: None   Collection Time: 02/08/23  4:29 AM   Specimen: BLOOD LEFT HAND  Result Value Ref Range Status   Specimen Description BLOOD LEFT HAND  Final   Special Requests   Final    BOTTLES DRAWN AEROBIC AND ANAEROBIC Blood Culture results may not be optimal due to an excessive volume of blood received in culture bottles   Culture   Final    NO GROWTH 5 DAYS Performed at [facility], [address] [place], [place] [address]    Report Status 02/13/2023 FINAL  Final    Radiology Reports No results found.    Signature  -   Huey Bienenstock M.D on 02/13/2023 at 1:32 PM   -  To page go to [email or web address]

## 2023-02-13 NOTE — Progress Notes (Signed)
Mobility Specialist: Progress Note   02/13/23 1612  Mobility  Activity Ambulated with assistance in room  Level of Assistance Contact guard assist, steadying assist  Assistive Device Front wheel walker  Distance Ambulated (ft) 40 ft (20'x2)  LLE Weight Bearing NWB  Activity Response Tolerated well  Mobility Referral Yes  $Mobility charge 1 Mobility  Mobility Specialist Start Time (ACUTE ONLY) 1545  Mobility Specialist Stop Time (ACUTE ONLY) 1612  Mobility Specialist Time Calculation (min) (ACUTE ONLY) 27 min   Post-Mobility: 99 HR  Pt received in the bed and agreeable to mobility. Mod I with bed mobility and contact guard during ambulation. Stopped x1 for seated break secondary to general fatigue. No c/o pain or dizziness throughout. Pt sitting EOB after session with call bell and phone in reach.   Jais Demir Mobility Specialist Please contact via SecureChat or Rehab office at [phone]

## 2023-02-13 NOTE — Progress Notes (Addendum)
ANTICOAGULATION CONSULT NOTE  Pharmacy Consult for Heparin;  and 5/16: Resume Warfarin Indication: Mechanical aortic valve  No Known Allergies  Patient Measurements: Height: 5\' 10"  (177.8 cm) Weight: (!) 165.6 kg (365 lb) IBW/kg (Calculated) : 73 Heparin Dosing Weight: 113.5 kg  Vital Signs: Pulse Rate: 77 (05/19 0200)  Labs: Recent Labs    02/11/23 0339 02/12/23 0304 02/12/23 1432 02/13/23 0440  HGB 9.1* 9.2*  --  9.3*  HCT 28.4* 28.9*  --  29.2*  PLT 240 233  --  220  LABPROT 14.2 14.4  --  14.4  INR 1.1 1.1  --  1.1  HEPARINUNFRC 0.45 0.27* 0.29* 0.45  CREATININE 0.87 0.87  --   --      Estimated Creatinine Clearance: 165.1 mL/min (by C-G formula based on SCr of 0.87 mg/dL).   Assessment: 65 yom with a history of T2DM, CAD, HTN, AVR, dyslipidemia. Recent admission with left BKA stump abscess s/p surgical I&D. Discharged from that encounter with enoxaparin/warfarin bridge. Patient is presenting with wound check / bleeding. Pharmacy consulted to re-initiate Warfarin with heparin bridge on 5/16 as bleeding has stabilized.  Home warfarin regimen is 10mg  T/R/Sa/Su and 5mg  on MWF and was bridging with 160 mg q12h SQ enoxaparin. Patient reports last warfarin dose 5/10 and last enoxaparin dose 5/11am.  Warfarin 12.5 mg dose administered 5/17, no warfarin given 5/18, INR today is 1.1 Hgb 9.2, stable, Plt 233 Has received blood transfusions 5/12, 5/13 and 5/16 this admission.  No stump bleeding or other bleeding reported.  Will likely require a few higher doses to move INR upward toward goal INR 2-3.     Heparin level 0.45, therapeutic on 2700 units/hr. Hgb 9.3, stable; plt wnl.   Goal of Therapy:  Heparin level 0.3-0.5 units/ml INR goal 2-3 Monitor platelets by anticoagulation protocol: Yes   Plan:  Warfarin 12.5mg  x1 today Increase heparin to 2700 units/hr Collect confirmatory heparin level in 6h Daily heparin level , INR and CBC Monitor for s/sx of  bleeding  Rennis Petty, PharmD PGY1 Pharmacy Resident 02/13/2023 7:29 AM  ______________________________________________________________ 5/19 1200 update  Confirmatory heparin level 0.43. Remains therapeutic. Continue current rate.  Monitor daily heparin level, INR, CBC  Rennis Petty, PharmD PGY1 Pharmacy Resident 02/13/2023 11:47 AM

## 2023-02-14 DIAGNOSIS — D62 Acute posthemorrhagic anemia: Secondary | ICD-10-CM | POA: Diagnosis not present

## 2023-02-14 LAB — BASIC METABOLIC PANEL
Anion gap: 9 (ref 5–15)
BUN: 14 mg/dL (ref 6–20)
CO2: 24 mmol/L (ref 22–32)
Calcium: 8.8 mg/dL — ABNORMAL LOW (ref 8.9–10.3)
Chloride: 100 mmol/L (ref 98–111)
Creatinine, Ser: 0.97 mg/dL (ref 0.61–1.24)
GFR, Estimated: >60 mL/min (ref >60)
Glucose, Bld: 168 mg/dL — ABNORMAL HIGH (ref 70–99)
Potassium: 3.6 mmol/L (ref 3.5–5.1)
Sodium: 133 mmol/L — ABNORMAL LOW (ref 135–145)

## 2023-02-14 LAB — CBC
HCT: 30.3 % — ABNORMAL LOW (ref 39.0–52.0)
Hemoglobin: 9.5 g/dL — ABNORMAL LOW (ref 13.0–17.0)
MCH: 27.1 pg (ref 26.0–34.0)
MCHC: 31.4 g/dL (ref 30.0–36.0)
MCV: 86.3 fL (ref 80.0–100.0)
Platelets: 237 10*3/uL (ref 150–400)
RBC: 3.51 MIL/uL — ABNORMAL LOW (ref 4.22–5.81)
RDW: 13.9 % (ref 11.5–15.5)
WBC: 6.5 10*3/uL (ref 4.0–10.5)
nRBC: 0 % (ref 0.0–0.2)

## 2023-02-14 LAB — HEPARIN LEVEL (UNFRACTIONATED): Heparin Unfractionated: 0.38 IU/mL (ref 0.30–0.70)

## 2023-02-14 LAB — GLUCOSE, CAPILLARY
Glucose-Capillary: 176 mg/dL — ABNORMAL HIGH (ref 70–99)
Glucose-Capillary: 170 mg/dL — ABNORMAL HIGH (ref 70–99)
Glucose-Capillary: 178 mg/dL — ABNORMAL HIGH (ref 70–99)
Glucose-Capillary: 220 mg/dL — ABNORMAL HIGH (ref 70–99)

## 2023-02-14 LAB — PROTIME-INR
INR: 1.1 (ref 0.8–1.2)
Prothrombin Time: 14.8 seconds (ref 11.4–15.2)

## 2023-02-14 LAB — CK: Total CK: 20 U/L — ABNORMAL LOW (ref 49–397)

## 2023-02-14 MED ORDER — WARFARIN SODIUM 7.5 MG PO TABS
15.0000 mg | ORAL_TABLET | Freq: Once | ORAL | Status: AC
  Administered 2023-02-14: 15 mg via ORAL
  Filled 2023-02-14: qty 2

## 2023-02-14 NOTE — Progress Notes (Signed)
ANTICOAGULATION CONSULT NOTE  Pharmacy Consult for Heparin;  and 5/16: Resumed Warfarin Indication: Mechanical aortic valve  No Known Allergies  Patient Measurements: Height: 5\' 10"  (177.8 cm) Weight: (!) 165.6 kg (365 lb) IBW/kg (Calculated) : 73 Heparin Dosing Weight: 113.5 kg  Vital Signs: Temp: 97.9 F (36.6 C) (05/20 0443) Temp Source: Axillary (05/20 0443) BP: 129/75 (05/20 0800) Pulse Rate: 79 (05/20 0800)  Labs: Recent Labs    02/12/23 0304 02/12/23 1432 02/13/23 0440 02/13/23 1039 02/14/23 0415  HGB 9.2*  --  9.3*  --  9.5*  HCT 28.9*  --  29.2*  --  30.3*  PLT 233  --  220  --  237  LABPROT 14.4  --  14.4  --  14.8  INR 1.1  --  1.1  --  1.1  HEPARINUNFRC 0.27*   < > 0.45 0.43 0.38  CREATININE 0.87  --   --   --  0.97  CKTOTAL  --   --   --   --  20*   < > = values in this interval not displayed.     Estimated Creatinine Clearance: 148.1 mL/min (by C-G formula based on SCr of 0.97 mg/dL).   Assessment: 46 yom with a history of T2DM, CAD, HTN, AVR, dyslipidemia. Recent admission with left BKA stump abscess s/p surgical I&D. Discharged from that encounter with enoxaparin/warfarin bridge. Patient is presenting with wound check / bleeding. Pharmacy consulted to re-initiate Warfarin with heparin bridge on 5/16 as bleeding has stabilized.  Home warfarin regimen is 10mg  T/R/Sa/Su and 5mg  on MWF and was bridging with 160 mg q12h SQ enoxaparin. Patient reports last warfarin dose 5/10 and last enoxaparin dose 5/11am.   Heparin level 0.38 is therapeutic on IV heparin infusion at 2700 units/hr.  INR 1.1 remains subtherapeutic after warfarin held on readmit date 5/12 until resumed warfarin on 5/16.   Missed dose on 5/18.  CBC stable with Hgb 9s  Has received blood transfusions 5/12, 5/13 and 5/16 this admission.  No stump bleeding or other bleeding reported.  Will likely require a few higher doses to move INR upward toward goal INR 2-3.     Goal of Therapy:   Heparin level 0.3-0.5 units/ml INR goal 2-3 Monitor platelets by anticoagulation protocol: Yes   Plan:  Warfarin 15 mg x1 today Continue IV Heparin drip at 2700 units/hr Daily heparin level , INR and CBC Monitor for s/sx of bleeding   Thank you for allowing pharmacy to be part of this patients care team.  Noah Delaine, RPh Clinical Pharmacist 02/14/2023 11:49 AM

## 2023-02-14 NOTE — Progress Notes (Signed)
PROGRESS NOTE                                                                                                                                                                                                             Patient Demographics:    Adam Long, is a 47 y.o. male, DOB - 05-04-1976, ZOX:[id number]  Outpatient Primary MD for the patient is [name], DO    LOS - 8  Admit date - 02/06/2023    Chief Complaint  Patient presents with   Post-op Problem       Brief Narrative (HPI from H&P)     47 y.o. male past medical history significant for anxiety coronary artery disease, essential hypertension diabetes mellitus type 2 mechanical aortic valve currently on Coumadin and Lovenox overlap, recently discharged from the hospital on 02/03/2023 for left BKA MRSA stump requiring revision (revision done by Dr. Lajoyce Corners on 01/29/2023, initial BKA was in 2022) surgical cultures confirm or say at home he was sent home on daptomycin with end of date 03/25/2023.   He went home and bumped his left BKA stump by mistake in his kitchen, subsequently started bleeding from the site came to the ER where he was diagnosed with significant blood loss related anemia and AKI and admitted to the hospital.   Subjective:  No significant events overnight, patient had very small amount of dried blood on gauze upon change yesterday, but none seen today.    Assessment  & Plan :    Acute blood loss anemia at the left BKA stump site due to mechanical injury in the setting of Coumadin and Lovenox overlap: He had massive bleed, total 5 units of packed RBC given last on 02/07/2023, as well as another unit on 5/17.   -Remains on heparin GTT, currently back on warfarin, no further evidence of bleed from stump site over last 24 hours . -Patient was transfused 1 unit PRBC yesterday with good response no evidence of blood very minimal amount of blood noted in the  gauze this morning, will continue to monitor while on anticoagulation.  . -He received 1 unit PRBC yesterday with good response, hemoglobin is 9.1 today  -So far hemoglobin remained stable, continue with heparin GTT bridge till INR is therapeutic.  His INR is very slow to respond to his warfarin, pharmacy managing, they  will increase warfarin dosing, very difficult situation here given patient patient recommended for anticoagulation dose is high at 160 mg twice daily, and upon recent discharge he came back with significant bleeding only after 3 days on this current dose while INR was still subtherapeutic, so for now has to continue with heparin GTT. Marland Kitchen Acute kidney injury: Likely prerenal in the setting of ongoing ACE inhibitor use and severe anemia, transfuse, hold ACE, renal function has stabilized.  Hyponatremia: Improving.   Mechanical aortic valve repair in 2021: His INR goal is 2-3, remains on heparin drip, started on warfarin,  Pharmacy to dose   S/P BKA (below knee amputation), left (HCC)/with history of left BKA MR stent stump abscess status post revision by due to on 01/29/2023, original left BKA was in 2022. Dr. Lajoyce Corners on board, continue wound care, his original left BKA was in 2022 followed by left BKA stump MRSA infection requiring revision by Dr. Lajoyce Corners on 01/29/2023, continue IV daptomycin with end date of 03/25/2023 > R Arm PICC.   Leukocytosis: Likely reactive likely due to severe acute blood loss, continue to monitor   Essential HTN (hypertension) Continue metoprolol, due to recent renal failure holding ACE inhibitor will add Norvasc instead.   Hyperlipidemia associated with type 2 diabetes mellitus (HCC) Continue statins.   Coronary artery disease: No anginal symptoms continue statins.   Anxiety: Continue Zoloft.   GERD: Continue Protonix.   Uncontrolled type 2 diabetes mellitus with hyperglycemia (HCC): Continue with carb modified diet, continue his long-acting insulin  plus sensitive sliding scale, poor outpt control  Lab Results  Component Value Date   HGBA1C 13.4 (H) 01/24/2023   CBG (last 3)  Recent Labs    02/13/23 2056 02/14/23 0752 02/14/23 1146  GLUCAP 150* 176* 170*         Condition - Extremely Guarded  Family Communication  :  None  Code Status :  Full  Consults  :  Ortho  PUD Prophylaxis : PPI   Procedures  :            Disposition Plan  :    Status is: Inpatient   DVT Prophylaxis  : He remains on heparin GTT.  Place and maintain sequential compression device Start: 02/07/23 0929 Lab Results  Component Value Date   PLT 237 02/14/2023    Diet :  Diet Order             Diet Carb Modified Fluid consistency: Thin; Room service appropriate? Yes  Diet effective now                    Inpatient Medications  Scheduled Meds:  amLODipine  10 mg Oral Daily   Chlorhexidine Gluconate Cloth  6 each Topical Daily   docusate sodium  200 mg Oral BID   ezetimibe  10 mg Oral Daily   insulin aspart  0-15 Units Subcutaneous TID WC   insulin aspart  0-5 Units Subcutaneous QHS   insulin aspart  3 Units Subcutaneous TID WC   insulin detemir  12 Units Subcutaneous BID   metoprolol tartrate  25 mg Oral BID   pantoprazole  40 mg Oral Daily   polyethylene glycol  17 g Oral BID   sertraline  50 mg Oral Daily   warfarin  15 mg Oral ONCE-1600   Warfarin - Pharmacist Dosing Inpatient   Does not apply q1600   Continuous Infusions:  DAPTOmycin (CUBICIN) 900 mg in sodium chloride 0.9 % IVPB 900 mg (  02/13/23 2101)   heparin 2,700 Units/hr (02/14/23 1148)   PRN Meds:.acetaminophen **OR** acetaminophen, methocarbamol, ondansetron **OR** ondansetron (ZOFRAN) IV, oxyCODONE, sodium chloride flush    Objective:   Vitals:   02/14/23 0005 02/14/23 0443 02/14/23 0800 02/14/23 1202  BP: 117/85 132/63 129/75 112/72  Pulse:   79 99  Resp: 18 18    Temp: 97.7 F (36.5 C) 97.9 F (36.6 C)    TempSrc: Axillary Axillary     SpO2: 97% 98%    Weight:      Height:        Wt Readings from Last 3 Encounters:  02/06/23 (!) 165.6 kg  02/02/23 (!) 168.1 kg  11/09/22 (!) 173.3 kg     Intake/Output Summary (Last 24 hours) at 02/14/2023 1324 Last data filed at 02/14/2023 0854 Gross per 24 hour  Intake --  Output 300 ml  Net -300 ml     Physical Exam  Awake Alert, Oriented X 3, No new F.N deficits, Normal affect Symmetrical Chest wall movement, Good air movement bilaterally, CTAB RRR,No Gallops,Rubs or new Murmurs, No Parasternal Heave +ve B.Sounds, Abd Soft, No tenderness, No rebound - guarding or rigidity.L BKA stump under pressure bandage, gauze has been seen and examined no blood noted in the gauze.  R Arm PICC.   Data Review:    Recent Labs  Lab 02/08/23 0426 02/09/23 0340 02/09/23 1736 02/10/23 0355 02/11/23 0339 02/12/23 0304 02/13/23 0440 02/14/23 0415  WBC 8.5 6.6  --  6.4 6.4 5.7 5.1 6.5  HGB 8.3* 7.7*   < > 7.9* 9.1* 9.2* 9.3* 9.5*  HCT 25.1* 24.3*  --  24.6* 28.4* 28.9* 29.2* 30.3*  PLT 223 211  --  207 240 233 220 237  MCV 87.5 86.8  --  88.2 86.1 86.8 86.1 86.3  MCH 28.9 27.5  --  28.3 27.6 27.6 27.4 27.1  MCHC 33.1 31.7  --  32.1 32.0 31.8 31.8 31.4  RDW 14.6 14.5  --  14.1 13.9 13.8 13.9 13.9  LYMPHSABS 2.0 1.6  --  1.9 1.8 1.7  --   --   MONOABS 0.6 0.5  --  0.5 0.4 0.5  --   --   EOSABS 0.2 0.2  --  0.3 0.2 0.3  --   --   BASOSABS 0.1 0.0  --  0.1 0.0 0.0  --   --    < > = values in this interval not displayed.    Recent Labs  Lab 02/08/23 0426 02/09/23 0340 02/10/23 0355 02/11/23 0339 02/12/23 0304 02/13/23 0440 02/14/23 0415  NA 135 135 133* 133* 136  --  133*  K 3.9 3.8 4.0 4.2 4.0  --  3.6  CL 105 105 99 101 102  --  100  CO2 24 25 26 25 26   --  24  ANIONGAP 6 5 8 7 8   --  9  GLUCOSE 142* 185* 153* 151* 148*  --  168*  BUN 20 12 9 9 10   --  14  CREATININE 1.08 0.97 0.86 0.87 0.87  --  0.97  INR  --   --   --  1.1 1.1 1.1 1.1  BNP 449.6* 491.6* 292.8*  136.8*  --   --   --   MG 2.0 2.1 2.1 2.0  --   --   --   CALCIUM 7.8* 7.8* 8.0* 8.5* 8.7*  --  8.8*      Recent Labs  Lab 02/08/23 0426 02/09/23 0340  02/10/23 0355 02/11/23 0339 02/12/23 0304 02/13/23 0440 02/14/23 0415  INR  --   --   --  1.1 1.1 1.1 1.1  BNP 449.6* 491.6* 292.8* 136.8*  --   --   --   MG 2.0 2.1 2.1 2.0  --   --   --   CALCIUM 7.8* 7.8* 8.0* 8.5* 8.7*  --  8.8*   Micro Results Recent Results (from the past 240 hour(s))  Culture, blood (Routine X 2) w Reflex to ID Panel     Status: None   Collection Time: 02/08/23  4:27 AM   Specimen: BLOOD LEFT HAND  Result Value Ref Range Status   Specimen Description BLOOD LEFT HAND  Final   Special Requests   Final    BOTTLES DRAWN AEROBIC AND ANAEROBIC Blood Culture adequate volume   Culture   Final    NO GROWTH 5 DAYS Performed at [facility], [address], [place], [place] [address]    Report Status 02/13/2023 FINAL  Final  Culture, blood (Routine X 2) w Reflex to ID Panel     Status: None   Collection Time: 02/08/23  4:29 AM   Specimen: BLOOD LEFT HAND  Result Value Ref Range Status   Specimen Description BLOOD LEFT HAND  Final   Special Requests   Final    BOTTLES DRAWN AEROBIC AND ANAEROBIC Blood Culture results may not be optimal due to an excessive volume of blood received in culture bottles   Culture   Final    NO GROWTH 5 DAYS Performed at [facility], [address], [place], [place] [address]    Report Status 02/13/2023 FINAL  Final    Radiology Reports No results found.    Signature  -   Huey Bienenstock M.D on 02/14/2023 at 1:24 PM   -  To page go to [email or web address]

## 2023-02-14 NOTE — Progress Notes (Signed)
Physical Therapy Treatment Patient Details Name: Adam Long MRN: [id number] DOB: [birth year] Today's Date: 02/14/2023   History of Present Illness 47 y/o M admitted to [facility] on 5/12 after hitting his recently revised L BKA, with complaints of bleeding and generalized weakness. Pt recently hospitalized 4/28-5/9 for his L BKA revision with Dr. Lajoyce Corners on 5/4. PMHx: anxiety, CAD, HTN, DM, mechanical aortic valve, on Coumadin and Lovenox.    PT Comments    Pt greeted resting in bed and eager for OOB mobility with continued progress towards acute goals. Pt continues to be limited by decreased activity tolerance, however pt able to progress gait distance with RW support and min guard for safety up to 54' with x2 seated rest breaks due to fatigue. Pt able to complete bed mobility at supervision level with increased time and transfer sit<>stand with min guard for safety. Continued education re; importance of OOB and appropriate activity progression with pt verbalizing understanding. Pt continues to benefit from skilled PT services to progress toward functional mobility goals.    Recommendations for follow up therapy are one component of a multi-disciplinary discharge planning process, led by the attending physician.  Recommendations may be updated based on patient status, additional functional criteria and insurance authorization.  Follow Up Recommendations       Assistance Recommended at Discharge Intermittent Supervision/Assistance  Patient can return home with the following Assistance with cooking/housework;Assist for transportation;Help with stairs or ramp for entrance;A little help with walking and/or transfers   Equipment Recommendations  None recommended by PT    Recommendations for Other Services       Precautions / Restrictions Precautions Precautions: Fall;Other (comment) Precaution Comments: recent L BKA revision Restrictions Weight Bearing Restrictions: No LLE Weight Bearing: Non  weight bearing     Mobility  Bed Mobility Overal bed mobility: Modified Independent Bed Mobility: Supine to Sit     Supine to sit: Supervision, HOB elevated     General bed mobility comments: + bed rail use, increased effort    Transfers Overall transfer level: Needs assistance Equipment used: Rolling walker (2 wheels) Transfers: Sit to/from Stand Sit to Stand: Min guard           General transfer comment: min guard for safety    Ambulation/Gait Ambulation/Gait assistance: Min guard Gait Distance (Feet): 70 Feet (25' + 30' + 15') Assistive device: Rolling walker (2 wheels) Gait Pattern/deviations:  (hop-to pattern on R) Gait velocity: decreased     General Gait Details: pt continues to be limtied by endurance with seated rest break needed between bouts, good foot clearnace on R with hopping, no LOB, HR up to 117bpm with activity   Stairs             Wheelchair Mobility    Modified Rankin (Stroke Patients Only)       Balance Overall balance assessment: Needs assistance Sitting-balance support: Feet supported, No upper extremity supported Sitting balance-Leahy Scale: Good Sitting balance - Comments: able to don R shoe without LOB   Standing balance support: Bilateral upper extremity supported, Reliant on assistive device for balance, During functional activity Standing balance-Leahy Scale: Poor Standing balance comment: reliant on RW but good balance with mobility                            Cognition Arousal/Alertness: Awake/alert Behavior During Therapy: WFL for tasks assessed/performed Overall Cognitive Status: Within Functional Limits for tasks assessed  General Comments: pt has specific techniques he uses to complete ADLs and IADL, likely to be more independent in his own environment        Exercises      General Comments General comments (skin integrity, edema, etc.): VSS on RA       Pertinent Vitals/Pain Pain Assessment Pain Assessment: Faces Faces Pain Scale: No hurt Pain Intervention(s): Monitored during session    Home Living                          Prior Function            PT Goals (current goals can now be found in the care plan section) Acute Rehab PT Goals Patient Stated Goal: go home PT Goal Formulation: With patient Time For Goal Achievement: 02/21/23 Progress towards PT goals: Progressing toward goals    Frequency    Min 3X/week      PT Plan Current plan remains appropriate    Co-evaluation              AM-PAC PT "6 Clicks" Mobility   Outcome Measure  Help needed turning from your back to your side while in a flat bed without using bedrails?: A Little Help needed moving from lying on your back to sitting on the side of a flat bed without using bedrails?: A Little Help needed moving to and from a bed to a chair (including a wheelchair)?: A Little Help needed standing up from a chair using your arms (e.g., wheelchair or bedside chair)?: A Little Help needed to walk in hospital room?: A Little Help needed climbing 3-5 steps with a railing? : A Lot 6 Click Score: 17    End of Session Equipment Utilized During Treatment: Gait belt Activity Tolerance: Patient tolerated treatment well Patient left: with call bell/phone within reach;in chair Nurse Communication: Mobility status PT Visit Diagnosis: Difficulty in walking, not elsewhere classified (R26.2)     Time: 4098-1191 PT Time Calculation (min) (ACUTE ONLY): 25 min  Charges:  $Gait Training: 23-37 mins                     Lenora Boys. PTA Acute Rehabilitation Services Office: [phone]   Catalina Antigua 02/14/2023, 9:57 AM

## 2023-02-15 ENCOUNTER — Inpatient Hospital Stay: Payer: Medicaid Other | Admitting: Internal Medicine

## 2023-02-15 DIAGNOSIS — D62 Acute posthemorrhagic anemia: Secondary | ICD-10-CM | POA: Diagnosis not present

## 2023-02-15 LAB — GLUCOSE, CAPILLARY
Glucose-Capillary: 146 mg/dL — ABNORMAL HIGH (ref 70–99)
Glucose-Capillary: 167 mg/dL — ABNORMAL HIGH (ref 70–99)
Glucose-Capillary: 204 mg/dL — ABNORMAL HIGH (ref 70–99)
Glucose-Capillary: 237 mg/dL — ABNORMAL HIGH (ref 70–99)
Glucose-Capillary: 166 mg/dL — ABNORMAL HIGH (ref 70–99)

## 2023-02-15 LAB — CBC
HCT: 28.8 % — ABNORMAL LOW (ref 39.0–52.0)
Hemoglobin: 9.1 g/dL — ABNORMAL LOW (ref 13.0–17.0)
MCH: 26.5 pg (ref 26.0–34.0)
MCHC: 31.6 g/dL (ref 30.0–36.0)
MCV: 84 fL (ref 80.0–100.0)
Platelets: 226 10*3/uL (ref 150–400)
RBC: 3.43 MIL/uL — ABNORMAL LOW (ref 4.22–5.81)
RDW: 13.9 % (ref 11.5–15.5)
WBC: 7.5 10*3/uL (ref 4.0–10.5)
nRBC: 0 % (ref 0.0–0.2)

## 2023-02-15 LAB — HEPARIN LEVEL (UNFRACTIONATED): Heparin Unfractionated: 0.41 IU/mL (ref 0.30–0.70)

## 2023-02-15 LAB — PROTIME-INR
INR: 1.2 (ref 0.8–1.2)
Prothrombin Time: 15 seconds (ref 11.4–15.2)

## 2023-02-15 MED ORDER — WARFARIN SODIUM 7.5 MG PO TABS
15.0000 mg | ORAL_TABLET | Freq: Once | ORAL | Status: AC
  Administered 2023-02-15: 15 mg via ORAL
  Filled 2023-02-15: qty 2

## 2023-02-15 NOTE — Progress Notes (Signed)
PROGRESS NOTE                                                                                                                                                                                                             Patient Demographics:    Adam Long, is a 47 y.o. male, DOB - 01-Apr-1976, WUJ:[id number]  Outpatient Primary MD for the patient is [name], DO    LOS - 9  Admit date - 02/06/2023    Chief Complaint  Patient presents with   Post-op Problem       Brief Narrative (HPI from H&P)     47 y.o. male past medical history significant for anxiety coronary artery disease, essential hypertension diabetes mellitus type 2 mechanical aortic valve currently on Coumadin and Lovenox overlap, recently discharged from the hospital on 02/03/2023 for left BKA MRSA stump requiring revision (revision done by Dr. Lajoyce Corners on 01/29/2023, initial BKA was in 2022) surgical cultures confirm or say at home he was sent home on daptomycin with end of date 03/25/2023.   He went home and bumped his left BKA stump by mistake in his kitchen, subsequently started bleeding from the site came to the ER where he was diagnosed with significant blood loss related anemia and AKI and admitted to the hospital.   Subjective:  Denies any complaints today, no significant events overnight  Assessment  & Plan :    Acute blood loss anemia at the left BKA stump site due to mechanical injury in the setting of Coumadin and Lovenox overlap: He had massive bleed, total 5 units of packed RBC given last on 02/07/2023, as well as another unit on 5/17.   -Remains on heparin GTT, currently back on warfarin, no further evidence of bleed from stump site. -So far hemoglobin remained stable, continue with heparin GTT bridge till INR is therapeutic.  His INR is very slow to respond to his warfarin, pharmacy managing, they will increase warfarin dosing, very difficult situation  here given patient patient recommended for anticoagulation dose is high at 160 mg twice daily, and upon recent discharge he came back with significant bleeding only after 3 days on this current dose while INR was still subtherapeutic, so for now has to continue with heparin GTT. Marland Kitchen Acute kidney injury: Likely prerenal in the setting of ongoing ACE inhibitor use  and severe anemia, transfuse, hold ACE, renal function has stabilized.  Hyponatremia: Improving.   Mechanical aortic valve repair in 2021: His INR goal is 2-3, remains on heparin drip, started on warfarin,  Pharmacy to dose   S/P BKA (below knee amputation), left (HCC)/with history of left BKA MR stent stump abscess status post revision by due to on 01/29/2023, original left BKA was in 2022. Dr. Lajoyce Corners on board, continue wound care, his original left BKA was in 2022 followed by left BKA stump MRSA infection requiring revision by Dr. Lajoyce Corners on 01/29/2023, continue IV daptomycin with end date of 03/25/2023 > R Arm PICC.   Leukocytosis: Likely reactive likely due to severe acute blood loss, continue to monitor   Essential HTN (hypertension) Continue metoprolol, due to recent renal failure holding ACE inhibitor will add Norvasc instead.   Hyperlipidemia associated with type 2 diabetes mellitus (HCC) Continue statins.   Coronary artery disease: No anginal symptoms continue statins.   Anxiety: Continue Zoloft.   GERD: Continue Protonix.   Uncontrolled type 2 diabetes mellitus with hyperglycemia (HCC): Continue with carb modified diet, continue his long-acting insulin plus sensitive sliding scale, poor outpt control  Lab Results  Component Value Date   HGBA1C 13.4 (H) 01/24/2023   CBG (last 3)  Recent Labs    02/14/23 2204 02/15/23 0846 02/15/23 1238  GLUCAP 220* 146* 167*         Condition - Extremely Guarded  Family Communication  :  None  Code Status :  Full  Consults  :  Ortho  PUD Prophylaxis : PPI   Procedures   :            Disposition Plan  :    Status is: Inpatient   DVT Prophylaxis  : He remains on heparin GTT.  Place and maintain sequential compression device Start: 02/07/23 0929 Lab Results  Component Value Date   PLT 226 02/15/2023    Diet :  Diet Order             Diet Carb Modified Fluid consistency: Thin; Room service appropriate? Yes  Diet effective now                    Inpatient Medications  Scheduled Meds:  amLODipine  10 mg Oral Daily   Chlorhexidine Gluconate Cloth  6 each Topical Daily   docusate sodium  200 mg Oral BID   ezetimibe  10 mg Oral Daily   insulin aspart  0-15 Units Subcutaneous TID WC   insulin aspart  0-5 Units Subcutaneous QHS   insulin aspart  3 Units Subcutaneous TID WC   insulin detemir  12 Units Subcutaneous BID   metoprolol tartrate  25 mg Oral BID   pantoprazole  40 mg Oral Daily   polyethylene glycol  17 g Oral BID   sertraline  50 mg Oral Daily   warfarin  15 mg Oral ONCE-1600   Warfarin - Pharmacist Dosing Inpatient   Does not apply q1600   Continuous Infusions:  DAPTOmycin (CUBICIN) 900 mg in sodium chloride 0.9 % IVPB 900 mg (02/14/23 2153)   heparin 2,700 Units/hr (02/14/23 2156)   PRN Meds:.acetaminophen **OR** acetaminophen, methocarbamol, ondansetron **OR** ondansetron (ZOFRAN) IV, oxyCODONE, sodium chloride flush    Objective:   Vitals:   02/14/23 2340 02/15/23 0359 02/15/23 0846 02/15/23 1241  BP: 114/75 120/66    Pulse: 90 71    Resp: 20 19    Temp: 98.3 F (36.8 C) 98.4 F (  36.9 C)    TempSrc: Oral Oral Oral Oral  SpO2: 95% 96%    Weight:      Height:        Wt Readings from Last 3 Encounters:  02/06/23 (!) 165.6 kg  02/02/23 (!) 168.1 kg  11/09/22 (!) 173.3 kg     Intake/Output Summary (Last 24 hours) at 02/15/2023 1459 Last data filed at 02/15/2023 1241 Gross per 24 hour  Intake --  Output 1900 ml  Net -1900 ml     Physical Exam  Awake Alert, Oriented X 3, No new F.N deficits, Normal  affect Symmetrical Chest wall movement, Good air movement bilaterally, CTAB RRR,No Gallops,Rubs or new Murmurs, No Parasternal Heave +ve B.Sounds, Abd Soft, No tenderness, No rebound - guarding or rigidity. .L BKA stump under pressure bandage, gauze has been seen and examined no blood noted in the gauze.  R Arm PICC.   Data Review:    Recent Labs  Lab 02/09/23 0340 02/09/23 1736 02/10/23 0355 02/11/23 0339 02/12/23 0304 02/13/23 0440 02/14/23 0415 02/15/23 0318  WBC 6.6  --  6.4 6.4 5.7 5.1 6.5 7.5  HGB 7.7*   < > 7.9* 9.1* 9.2* 9.3* 9.5* 9.1*  HCT 24.3*  --  24.6* 28.4* 28.9* 29.2* 30.3* 28.8*  PLT 211  --  207 240 233 220 237 226  MCV 86.8  --  88.2 86.1 86.8 86.1 86.3 84.0  MCH 27.5  --  28.3 27.6 27.6 27.4 27.1 26.5  MCHC 31.7  --  32.1 32.0 31.8 31.8 31.4 31.6  RDW 14.5  --  14.1 13.9 13.8 13.9 13.9 13.9  LYMPHSABS 1.6  --  1.9 1.8 1.7  --   --   --   MONOABS 0.5  --  0.5 0.4 0.5  --   --   --   EOSABS 0.2  --  0.3 0.2 0.3  --   --   --   BASOSABS 0.0  --  0.1 0.0 0.0  --   --   --    < > = values in this interval not displayed.    Recent Labs  Lab 02/09/23 0340 02/10/23 0355 02/11/23 0339 02/12/23 0304 02/13/23 0440 02/14/23 0415 02/15/23 0318  NA 135 133* 133* 136  --  133*  --   K 3.8 4.0 4.2 4.0  --  3.6  --   CL 105 99 101 102  --  100  --   CO2 25 26 25 26   --  24  --   ANIONGAP 5 8 7 8   --  9  --   GLUCOSE 185* 153* 151* 148*  --  168*  --   BUN 12 9 9 10   --  14  --   CREATININE 0.97 0.86 0.87 0.87  --  0.97  --   INR  --   --  1.1 1.1 1.1 1.1 1.2  BNP 491.6* 292.8* 136.8*  --   --   --   --   MG 2.1 2.1 2.0  --   --   --   --   CALCIUM 7.8* 8.0* 8.5* 8.7*  --  8.8*  --       Recent Labs  Lab 02/09/23 0340 02/10/23 0355 02/11/23 0339 02/12/23 0304 02/13/23 0440 02/14/23 0415 02/15/23 0318  INR  --   --  1.1 1.1 1.1 1.1 1.2  BNP 491.6* 292.8* 136.8*  --   --   --   --  MG 2.1 2.1 2.0  --   --   --   --   CALCIUM 7.8* 8.0* 8.5* 8.7*   --  8.8*  --    Micro Results Recent Results (from the past 240 hour(s))  Culture, blood (Routine X 2) w Reflex to ID Panel     Status: None   Collection Time: 02/08/23  4:27 AM   Specimen: BLOOD LEFT HAND  Result Value Ref Range Status   Specimen Description BLOOD LEFT HAND  Final   Special Requests   Final    BOTTLES DRAWN AEROBIC AND ANAEROBIC Blood Culture adequate volume   Culture   Final    NO GROWTH 5 DAYS Performed at [facility], [address], [place], [place] [address]    Report Status 02/13/2023 FINAL  Final  Culture, blood (Routine X 2) w Reflex to ID Panel     Status: None   Collection Time: 02/08/23  4:29 AM   Specimen: BLOOD LEFT HAND  Result Value Ref Range Status   Specimen Description BLOOD LEFT HAND  Final   Special Requests   Final    BOTTLES DRAWN AEROBIC AND ANAEROBIC Blood Culture results may not be optimal due to an excessive volume of blood received in culture bottles   Culture   Final    NO GROWTH 5 DAYS Performed at [facility], [address] [place], [place] [address]    Report Status 02/13/2023 FINAL  Final    Radiology Reports No results found.    Signature  -   Huey Bienenstock M.D on 02/15/2023 at 2:59 PM   -  To page go to [email or web address]

## 2023-02-15 NOTE — Progress Notes (Signed)
ANTICOAGULATION CONSULT NOTE  Pharmacy Consult for Heparin bridge Adam Long Indication: h/o  Mechanical aortic valve  No Known Allergies  Patient Measurements: Height: 5\' 10"  (177.8 cm) Weight: (!) 165.6 kg (365 lb) IBW/kg (Calculated) : 73 Heparin Dosing Weight: 113.5 kg  Vital Signs: Temp: 98.4 F (36.9 C) (05/21 0359) Temp Source: Oral (05/21 0846) BP: 120/66 (05/21 0359) Pulse Rate: 71 (05/21 0359)  Labs: Recent Labs    02/13/23 0440 02/13/23 1039 02/14/23 0415 02/15/23 0318  HGB 9.3*  --  9.5* 9.1*  HCT 29.2*  --  30.3* 28.8*  PLT 220  --  237 226  LABPROT 14.4  --  14.8 15.0  INR 1.1  --  1.1 1.2  HEPARINUNFRC 0.45 0.43 0.38 0.41  CREATININE  --   --  0.97  --   CKTOTAL  --   --  20*  --      Estimated Creatinine Clearance: 148.1 mL/min (by C-G formula based on SCr of 0.97 mg/dL).   Assessment: 6 yom with a history of T2DM, CAD, HTN, AVR, dyslipidemia. Recent admission with left BKA stump abscess s/p surgical I&D. Discharged from that encounter with enoxaparin/warfarin bridge. Patient is presenting with wound check / bleeding. Pharmacy consulted to re-initiate Warfarin with heparin bridge on 5/16 as bleeding has stabilized.  Home warfarin regimen is 10mg  T/R/Sa/Su and 5mg  on MWF and was bridging with 160 mg q12h SQ enoxaparin. Patient reports last warfarin dose 5/10 and last enoxaparin dose 5/11am.  Heparin bridge to warfarin until INR at goal 2-3 Heparin level 0.41 remains therapeutic on IV heparin infusion at 2700 units/hr.   INR 1.2 today, subtherapeutic after warfarin held on readmit date 5/12 until resumed warfarin on 5/16.   Missed dose on 5/18.  CBC stable with Hgb 9s , pltc wnl stable  Has received blood transfusions 5/12, 5/13 and 5/16 this admission.  No stump bleeding or other bleeding reported.  Will likely require a few higher doses to move INR upward toward goal INR 2-3.     Goal of Therapy:  Heparin level 0.3-0.5 units/ml INR goal  2-3 Monitor platelets by anticoagulation protocol: Yes   Plan:  Warfarin 15 mg x1 today Continue IV Heparin drip at 2700 units/hr Daily heparin level , INR and CBC Monitor for s/sx of bleeding   Thank you for allowing pharmacy to be part of this patients care team.  Noah Delaine, RPh Clinical Pharmacist 02/15/2023 10:57 AM

## 2023-02-15 NOTE — Progress Notes (Addendum)
Pharmacy Antibiotic Note  Adam Long is a 47 y.o. male for which pharmacy has been consulted for daptomycin dosing for Left BKA MRSA stump abscess s/p irrigation and debridement w/ wound VAC and s/p left BKA by Dr. Lajoyce Corners 5/4. Previous cultures with MRSA ID planned for daptomycin OPAT (EOT 6/28). Pharmacy consulted to continue Daptomycin for MRSA stump abscess.   Patient remains afebrile, WBC wnl, no s/s of worsening infection. Scr 0.97 Monitoring weekly CK while on Daptomycin.   CK qMonday.  CK 5/14 = 31 5/20 = 20   Plan: Continue Daptomycin 8mg /kg daily Monitor clinical course, WBC, cultures Weekly CK while on Dapto  Height: 5\' 10"  (177.8 cm) Weight: (!) 165.6 kg (365 lb) IBW/kg (Calculated) : 73  Temp (24hrs), Avg:98.4 F (36.9 C), Min:98.3 F (36.8 C), Max:98.6 F (37 C)  Recent Labs  Lab 02/09/23 0340 02/10/23 0355 02/11/23 0339 02/12/23 0304 02/13/23 0440 02/14/23 0415 02/15/23 0318  WBC 6.6 6.4 6.4 5.7 5.1 6.5 7.5  CREATININE 0.97 0.86 0.87 0.87  --  0.97  --      Estimated Creatinine Clearance: 148.1 mL/min (by C-G formula based on SCr of 0.97 mg/dL).    No Known Allergies  Antimicrobials this admission: Daptomycin 5/7 >> (6/28)  Dose adjustments this admission: N/a  Microbiology results: 5/14 BCx: negative  Thank you for allowing pharmacy to be a part of this patient's care.  Noah Delaine, RPh Clinical Pharmacist [phone] 02/15/2023 11:04 AM   Please check AMION for all [facility] Pharmacy phone numbers After 10:00 PM, call Main Pharmacy [phone]

## 2023-02-16 ENCOUNTER — Inpatient Hospital Stay: Payer: Medicaid Other | Admitting: Family Medicine

## 2023-02-16 DIAGNOSIS — D62 Acute posthemorrhagic anemia: Secondary | ICD-10-CM | POA: Diagnosis not present

## 2023-02-16 DIAGNOSIS — E1165 Type 2 diabetes mellitus with hyperglycemia: Secondary | ICD-10-CM | POA: Diagnosis not present

## 2023-02-16 DIAGNOSIS — E1169 Type 2 diabetes mellitus with other specified complication: Secondary | ICD-10-CM

## 2023-02-16 DIAGNOSIS — E785 Hyperlipidemia, unspecified: Secondary | ICD-10-CM

## 2023-02-16 DIAGNOSIS — N179 Acute kidney failure, unspecified: Secondary | ICD-10-CM | POA: Diagnosis not present

## 2023-02-16 LAB — GLUCOSE, CAPILLARY
Glucose-Capillary: 158 mg/dL — ABNORMAL HIGH (ref 70–99)
Glucose-Capillary: 220 mg/dL — ABNORMAL HIGH (ref 70–99)
Glucose-Capillary: 179 mg/dL — ABNORMAL HIGH (ref 70–99)
Glucose-Capillary: 148 mg/dL — ABNORMAL HIGH (ref 70–99)
Glucose-Capillary: 133 mg/dL — ABNORMAL HIGH (ref 70–99)
Glucose-Capillary: 154 mg/dL — ABNORMAL HIGH (ref 70–99)

## 2023-02-16 LAB — CBC
HCT: 29 % — ABNORMAL LOW (ref 39.0–52.0)
Hemoglobin: 9.3 g/dL — ABNORMAL LOW (ref 13.0–17.0)
MCH: 27.2 pg (ref 26.0–34.0)
MCHC: 32.1 g/dL (ref 30.0–36.0)
MCV: 84.8 fL (ref 80.0–100.0)
Platelets: 222 10*3/uL (ref 150–400)
RBC: 3.42 MIL/uL — ABNORMAL LOW (ref 4.22–5.81)
RDW: 13.8 % (ref 11.5–15.5)
WBC: 6.5 10*3/uL (ref 4.0–10.5)
nRBC: 0 % (ref 0.0–0.2)

## 2023-02-16 LAB — PROTIME-INR
INR: 1.3 — ABNORMAL HIGH (ref 0.8–1.2)
Prothrombin Time: 16.5 seconds — ABNORMAL HIGH (ref 11.4–15.2)

## 2023-02-16 LAB — HEPARIN LEVEL (UNFRACTIONATED): Heparin Unfractionated: 0.4 IU/mL (ref 0.30–0.70)

## 2023-02-16 MED ORDER — WARFARIN SODIUM 7.5 MG PO TABS
17.5000 mg | ORAL_TABLET | Freq: Once | ORAL | Status: AC
  Administered 2023-02-16: 17.5 mg via ORAL
  Filled 2023-02-16: qty 1

## 2023-02-16 MED ORDER — ENOXAPARIN SODIUM 300 MG/3ML IJ SOLN
160.0000 mg | Freq: Two times a day (BID) | INTRAMUSCULAR | Status: DC
  Administered 2023-02-16 – 2023-02-17 (×2): 160 mg via SUBCUTANEOUS
  Filled 2023-02-16 (×4): qty 1.6

## 2023-02-16 NOTE — Progress Notes (Signed)
PROGRESS NOTE        PATIENT DETAILS Name: Adam Long Age: 47 y.o. Sex: male Date of Birth: 06/07/1976 Admit Date: 02/06/2023 Admitting Physician Marinda Elk, MD UJW:[id number], Verdis Frederickson, DO  Brief Summary: Patient is a 47 y.o.  male with HTN, DM-2, mechanical aortic valve on Coumadin-who was discharged from the hospital on 5/9 after left BKA MRSA stump infection requiring revision-presented to the hospital with bleeding from his stump site after he bumped his left lower extremity in the kitchen.  He was found to have acute blood loss anemia/AKI and subsequently admitted to the hospitalist service.  Significant events: 5/12>> admit to [facility]  Significant studies: 5/12>> CXR: No PNA  Significant microbiology data: 5/14>> blood culture: No growth  Procedures: None  Consults: ID Orthopedics  Subjective: Lying comfortably in bed-denies any chest pain or shortness of breath.  Anxious to go home.  Objective: Vitals: Blood pressure 118/72, pulse 74, temperature 98.4 F (36.9 C), temperature source Oral, resp. rate 20, height 5\' 10"  (1.778 m), weight (!) 165.6 kg, SpO2 95 %.   Exam: Gen Exam:Alert awake-not in any distress HEENT:atraumatic, normocephalic Chest: B/L clear to auscultation anteriorly CVS:S1S2 regular Abdomen:soft non tender, non distended Extremities:no edema BKA site stump-wrapped-no obvious blood noted. Neurology: Non focal Skin: no rash  Pertinent Labs/Radiology:    Latest Ref Rng & Units 02/16/2023    3:36 AM 02/15/2023    3:18 AM 02/14/2023    4:15 AM  CBC  WBC 4.0 - 10.5 K/uL 6.5  7.5  6.5   Hemoglobin 13.0 - 17.0 g/dL 9.3  9.1  9.5   Hematocrit 39.0 - 52.0 % 29.0  28.8  30.3   Platelets 150 - 400 K/uL 222  226  237     Lab Results  Component Value Date   NA 133 (L) 02/14/2023   K 3.6 02/14/2023   CL 100 02/14/2023   CO2 24 02/14/2023      Assessment/Plan: Bleeding from left BKA stump with acute blood loss  anemia Bleeding was secondary to mechanical injury Thankfully bleeding has resolved with supportive care Required 5 units of PRBC-Hb now stable.  AKI Hemodynamically mediated Resolved with supportive care  Left BKA MRSA stump infection IV daptomycin-end date-6/28  Mechanical aortic valve 2021 On overlapping heparin/Coumadin-INR still subtherapeutic. Discussed with patient-he is anxious to go home-will switch him to Lovenox today-continue Coumadin-if no major bleeding issues overnight-likely home on 5/23.  HTN BP stable Metoprolol/Norvasc  HLD Statin/Zetia  DM-2 CBG stable Levemir 12 units twice daily, 3 units of NovoLog with meals, SSI  Mood disorder Stable Zoloft  Morbid Obesity: Estimated body mass index is 52.37 kg/m as calculated from the following:   Height as of this encounter: 5\' 10"  (1.778 m).   Weight as of this encounter: 165.6 kg.   Code status:   Code Status: Full Code   DVT Prophylaxis: Place and maintain sequential compression device Start: 02/07/23 0929 warfarin (COUMADIN) tablet 17.5 mg    Family Communication: None at bedside   Disposition Plan: Status is: Inpatient Remains inpatient appropriate because: Severity of illness   Planned Discharge Destination:Home   Diet: Diet Order             Diet Carb Modified Fluid consistency: Thin; Room service appropriate? Yes  Diet effective now  Antimicrobial agents: Anti-infectives (From admission, onward)    Start     Dose/Rate Route Frequency Ordered Stop   02/06/23 1315  DAPTOmycin (CUBICIN) 900 mg in sodium chloride 0.9 % IVPB        8 mg/kg  110 kg (Adjusted) 136 mL/hr over 30 Minutes Intravenous Daily 02/06/23 1304          MEDICATIONS: Scheduled Meds:  amLODipine  10 mg Oral Daily   Chlorhexidine Gluconate Cloth  6 each Topical Daily   docusate sodium  200 mg Oral BID   enoxaparin (LOVENOX) injection  160 mg Subcutaneous Q12H   ezetimibe  10 mg Oral  Daily   insulin aspart  0-15 Units Subcutaneous TID WC   insulin aspart  0-5 Units Subcutaneous QHS   insulin aspart  3 Units Subcutaneous TID WC   insulin detemir  12 Units Subcutaneous BID   metoprolol tartrate  25 mg Oral BID   pantoprazole  40 mg Oral Daily   polyethylene glycol  17 g Oral BID   sertraline  50 mg Oral Daily   warfarin  17.5 mg Oral ONCE-1600   Warfarin - Pharmacist Dosing Inpatient   Does not apply q1600   Continuous Infusions:  DAPTOmycin (CUBICIN) 900 mg in sodium chloride 0.9 % IVPB 900 mg (02/15/23 1932)   heparin 2,700 Units/hr (02/16/23 1047)   PRN Meds:.acetaminophen **OR** acetaminophen, methocarbamol, ondansetron **OR** ondansetron (ZOFRAN) IV, oxyCODONE, sodium chloride flush   I have personally reviewed following labs and imaging studies  LABORATORY DATA: CBC: Recent Labs  Lab 02/10/23 0355 02/11/23 0339 02/12/23 0304 02/13/23 0440 02/14/23 0415 02/15/23 0318 02/16/23 0336  WBC 6.4 6.4 5.7 5.1 6.5 7.5 6.5  NEUTROABS 3.6 3.8 3.2  --   --   --   --   HGB 7.9* 9.1* 9.2* 9.3* 9.5* 9.1* 9.3*  HCT 24.6* 28.4* 28.9* 29.2* 30.3* 28.8* 29.0*  MCV 88.2 86.1 86.8 86.1 86.3 84.0 84.8  PLT 207 240 233 220 237 226 222    Basic Metabolic Panel: Recent Labs  Lab 02/10/23 0355 02/11/23 0339 02/12/23 0304 02/14/23 0415  NA 133* 133* 136 133*  K 4.0 4.2 4.0 3.6  CL 99 101 102 100  CO2 26 25 26 24   GLUCOSE 153* 151* 148* 168*  BUN 9 9 10 14   CREATININE 0.86 0.87 0.87 0.97  CALCIUM 8.0* 8.5* 8.7* 8.8*  MG 2.1 2.0  --   --     GFR: Estimated Creatinine Clearance: 148.1 mL/min (by C-G formula based on SCr of 0.97 mg/dL).  Liver Function Tests: No results for input(s): "AST", "ALT", "ALKPHOS", "BILITOT", "PROT", "ALBUMIN" in the last 168 hours. No results for input(s): "LIPASE", "AMYLASE" in the last 168 hours. No results for input(s): "AMMONIA" in the last 168 hours.  Coagulation Profile: Recent Labs  Lab 02/12/23 0304 02/13/23 0440  02/14/23 0415 02/15/23 0318 02/16/23 0336  INR 1.1 1.1 1.1 1.2 1.3*    Cardiac Enzymes: Recent Labs  Lab 02/14/23 0415  CKTOTAL 20*    BNP (last 3 results) No results for input(s): "PROBNP" in the last 8760 hours.  Lipid Profile: No results for input(s): "CHOL", "HDL", "LDLCALC", "TRIG", "CHOLHDL", "LDLDIRECT" in the last 72 hours.  Thyroid Function Tests: No results for input(s): "TSH", "T4TOTAL", "FREET4", "T3FREE", "THYROIDAB" in the last 72 hours.  Anemia Panel: No results for input(s): "VITAMINB12", "FOLATE", "FERRITIN", "TIBC", "IRON", "RETICCTPCT" in the last 72 hours.  Urine analysis:    Component Value Date/Time   COLORURINE AMBER (  A) 02/06/2023 1221   APPEARANCEUR CLOUDY (A) 02/06/2023 1221   LABSPEC 1.018 02/06/2023 1221   PHURINE 5.0 02/06/2023 1221   GLUCOSEU NEGATIVE 02/06/2023 1221   HGBUR NEGATIVE 02/06/2023 1221   BILIRUBINUR NEGATIVE 02/06/2023 1221   KETONESUR NEGATIVE 02/06/2023 1221   PROTEINUR NEGATIVE 02/06/2023 1221   UROBILINOGEN 0.2 08/31/2011 1727   NITRITE NEGATIVE 02/06/2023 1221   LEUKOCYTESUR NEGATIVE 02/06/2023 1221    Sepsis Labs: Lactic Acid, Venous    Component Value Date/Time   LATICACIDVEN 5.5 (HH) 02/06/2023 1110    MICROBIOLOGY: Recent Results (from the past 240 hour(s))  Culture, blood (Routine X 2) w Reflex to ID Panel     Status: None   Collection Time: 02/08/23  4:27 AM   Specimen: BLOOD LEFT HAND  Result Value Ref Range Status   Specimen Description BLOOD LEFT HAND  Final   Special Requests   Final    BOTTLES DRAWN AEROBIC AND ANAEROBIC Blood Culture adequate volume   Culture   Final    NO GROWTH 5 DAYS Performed at [facility], [address], [place], [place] [address]    Report Status 02/13/2023 FINAL  Final  Culture, blood (Routine X 2) w Reflex to ID Panel     Status: None   Collection Time: 02/08/23  4:29 AM   Specimen: BLOOD LEFT HAND  Result Value Ref Range Status   Specimen Description  BLOOD LEFT HAND  Final   Special Requests   Final    BOTTLES DRAWN AEROBIC AND ANAEROBIC Blood Culture results may not be optimal due to an excessive volume of blood received in culture bottles   Culture   Final    NO GROWTH 5 DAYS Performed at [facility], [address], [place], [place] [address]    Report Status 02/13/2023 FINAL  Final    RADIOLOGY STUDIES/RESULTS: No results found.   LOS: 10 days   Jeoffrey Massed, MD  [facility]    To contact the attending provider between 7A-7P or the covering provider during after hours 7P-7A, please log into the web site [email or web address] and access using universal [employer] password for that web site. If you do not have the password, please call the hospital operator.  02/16/2023, 11:08 AM

## 2023-02-16 NOTE — Progress Notes (Signed)
Physical Therapy Treatment Patient Details Name: Adam Long MRN: [id number] DOB: November 05, 1975 Today's Date: 02/16/2023   History of Present Illness 47 y/o M admitted to [facility] on 5/12 after hitting his recently revised L BKA, with complaints of bleeding and generalized weakness. Pt recently hospitalized 4/28-5/9 for his L BKA revision with Dr. Lajoyce Corners on 5/4. PMHx: anxiety, CAD, HTN, DM, mechanical aortic valve, on Coumadin and Lovenox.    PT Comments    Pt was seen for mobility on chair and to practice standing up with RW in front of him.  Can stand with one UE on the chair, not using AD.  He is motivated to try to get home soon, able to safely get to stand and to walk short trips to [facility] and across room.  Follow along with him to get back to home situation safely.     Recommendations for follow up therapy are one component of a multi-disciplinary discharge planning process, led by the attending physician.  Recommendations may be updated based on patient status, additional functional criteria and insurance authorization.  Follow Up Recommendations       Assistance Recommended at Discharge Intermittent Supervision/Assistance  Patient can return home with the following Assistance with cooking/housework;Assist for transportation;Help with stairs or ramp for entrance;A little help with walking and/or transfers   Equipment Recommendations  None recommended by PT    Recommendations for Other Services       Precautions / Restrictions Precautions Precautions: Fall;Other (comment) Precaution Comments: recent L BKA revision Restrictions Weight Bearing Restrictions: No LLE Weight Bearing: Non weight bearing Other Position/Activity Restrictions: limb protector at home     Mobility  Bed Mobility               General bed mobility comments: up in chair when PT arrived    Transfers Overall transfer level: Needs assistance Equipment used: Rolling walker (2 wheels) Transfers: Sit  to/from Stand Sit to Stand: Supervision           General transfer comment: pt is able to stand without direct support but encouraged him to have help    Ambulation/Gait                   Stairs             Wheelchair Mobility    Modified Rankin (Stroke Patients Only)       Balance Overall balance assessment: Needs assistance Sitting-balance support: Single extremity supported Sitting balance-Leahy Scale: Good     Standing balance support: Bilateral upper extremity supported, During functional activity Standing balance-Leahy Scale: Poor                              Cognition Arousal/Alertness: Awake/alert Behavior During Therapy: WFL for tasks assessed/performed Overall Cognitive Status: Within Functional Limits for tasks assessed                                          Exercises General Exercises - Lower Extremity Ankle Circles/Pumps: AROM, Right, 10 reps Quad Sets: AROM, 10 reps Gluteal Sets: AROM, 15 reps Heel Slides: AROM, 10 reps Hip ABduction/ADduction: AROM, 10 reps    General Comments General comments (skin integrity, edema, etc.): Pt demonstrated a stand up at chair, good response to transfer practice and review of HEP      Pertinent Vitals/Pain  Pain Assessment Pain Assessment: No/denies pain    Home Living                          Prior Function            PT Goals (current goals can now be found in the care plan section)      Frequency    Min 3X/week      PT Plan Current plan remains appropriate    Co-evaluation              AM-PAC PT "6 Clicks" Mobility   Outcome Measure  Help needed turning from your back to your side while in a flat bed without using bedrails?: A Little Help needed moving from lying on your back to sitting on the side of a flat bed without using bedrails?: A Little Help needed moving to and from a bed to a chair (including a wheelchair)?: A  Little Help needed standing up from a chair using your arms (e.g., wheelchair or bedside chair)?: A Little Help needed to walk in hospital room?: A Little Help needed climbing 3-5 steps with a railing? : A Lot 6 Click Score: 17    End of Session Equipment Utilized During Treatment: Gait belt Activity Tolerance: Patient tolerated treatment well Patient left: with call bell/phone within reach;in chair Nurse Communication: Mobility status PT Visit Diagnosis: Difficulty in walking, not elsewhere classified (R26.2)     Time: 1422-1440 PT Time Calculation (min) (ACUTE ONLY): 18 min  Charges:  $Therapeutic Exercise: 8-22 mins             Ivar Drape 02/16/2023, 5:01 PM  Samul Dada, PT PhD Acute Rehab Dept. Number: [facility] [id number] and [facility] [phone]

## 2023-02-16 NOTE — Progress Notes (Addendum)
ANTICOAGULATION CONSULT NOTE  Pharmacy Consult for Enoxaparin bridge /Warfarin Indication: mechanical aortic valve  No Known Allergies  Patient Measurements: Height: 5\' 10"  (177.8 cm) Weight: (!) 165.6 kg (365 lb) IBW/kg (Calculated) : 73 Heparin Dosing Weight: 114 kg  Vital Signs: Temp: 98 F (36.7 C) (05/22 0517) Temp Source: Oral (05/22 0801) BP: 126/68 (05/22 0517) Pulse Rate: 83 (05/22 0517)  Labs: Recent Labs    02/14/23 0415 02/15/23 0318 02/16/23 0336  HGB 9.5* 9.1* 9.3*  HCT 30.3* 28.8* 29.0*  PLT 237 226 222  LABPROT 14.8 15.0 16.5*  INR 1.1 1.2 1.3*  HEPARINUNFRC 0.38 0.41 0.40  CREATININE 0.97  --   --   CKTOTAL 20*  --   --     Estimated Creatinine Clearance: 148.1 mL/min (by C-G formula based on SCr of 0.97 mg/dL).   Assessment: 89 YOM with a history of mechanical AVR on warfarin PTA. Recent admission with left BKA stump abscess s/p surgical I&D who was discharged with enoxaparin/warfarin bridge. Patient is presenting with wound check / bleeding. Pharmacy consulted to re-initiate warfarin with heparin bridge on 5/16 as bleeding has stabilized.  Home warfarin regimen is 10mg  T/R/Sa/Su and 5mg  on MWF and was bridging with 160 mg q12h SQ enoxaparin. Patient reports last warfarin dose 5/10 and last enoxaparin dose 5/11 am.  CBC stable with Hgb 9s, pltc stable. Has received blood transfusions 5/12, 5/13 and 5/16 this admission. No stump bleeding or other bleeding reported. INR subtherapeutic/labile at 1.3 s/p 2 doses warfarin 15 mg.   Heparin therapeutic 0.40 at 2700 units/hr. OK to transition to enoxaparin SQ in anticipation of upcoming discharge.  Goal of Therapy:  Anti-Xa level 0.6-1 units/ml INR goal 2-3 Monitor platelets by anticoagulation protocol: Yes   Plan:  Give warfarin 17.5 mg PO x1 dose Transition to enoxaparin 160mg  SQ Q12h  Check INR daily while on warfarin Check Anti-Xa levels PRN while on enoxaparin Continue to monitor H&H and  platelets   Thank you for allowing pharmacy to be a part of this patient's care.  Thelma Barge, PharmD Clinical Pharmacist

## 2023-02-17 ENCOUNTER — Encounter: Payer: Self-pay | Admitting: Internal Medicine

## 2023-02-17 ENCOUNTER — Telehealth: Payer: Self-pay | Admitting: Surgery

## 2023-02-17 DIAGNOSIS — N179 Acute kidney failure, unspecified: Secondary | ICD-10-CM | POA: Diagnosis not present

## 2023-02-17 DIAGNOSIS — T8740 Infection of amputation stump, unspecified extremity: Secondary | ICD-10-CM

## 2023-02-17 DIAGNOSIS — L03116 Cellulitis of left lower limb: Secondary | ICD-10-CM | POA: Diagnosis not present

## 2023-02-17 DIAGNOSIS — L02416 Cutaneous abscess of left lower limb: Secondary | ICD-10-CM

## 2023-02-17 DIAGNOSIS — D62 Acute posthemorrhagic anemia: Secondary | ICD-10-CM | POA: Diagnosis not present

## 2023-02-17 DIAGNOSIS — Z89512 Acquired absence of left leg below knee: Secondary | ICD-10-CM | POA: Diagnosis not present

## 2023-02-17 LAB — CBC
HCT: 29.1 % — ABNORMAL LOW (ref 39.0–52.0)
Hemoglobin: 9.3 g/dL — ABNORMAL LOW (ref 13.0–17.0)
MCH: 26.6 pg (ref 26.0–34.0)
MCHC: 32 g/dL (ref 30.0–36.0)
MCV: 83.4 fL (ref 80.0–100.0)
Platelets: 236 10*3/uL (ref 150–400)
RBC: 3.49 MIL/uL — ABNORMAL LOW (ref 4.22–5.81)
RDW: 13.9 % (ref 11.5–15.5)
WBC: 8.3 10*3/uL (ref 4.0–10.5)
nRBC: 0 % (ref 0.0–0.2)

## 2023-02-17 LAB — PROTIME-INR
INR: 1.5 — ABNORMAL HIGH (ref 0.8–1.2)
Prothrombin Time: 18.7 seconds — ABNORMAL HIGH (ref 11.4–15.2)

## 2023-02-17 LAB — GLUCOSE, CAPILLARY: Glucose-Capillary: 180 mg/dL — ABNORMAL HIGH (ref 70–99)

## 2023-02-17 LAB — HEPARIN ANTI-XA: Heparin LMW: 0.45 IU/mL

## 2023-02-17 MED ORDER — ENOXAPARIN SODIUM 300 MG/3ML IJ SOLN
160.0000 mg | Freq: Two times a day (BID) | INTRAMUSCULAR | Status: DC
  Filled 2023-02-17: qty 1.6

## 2023-02-17 MED ORDER — WARFARIN SODIUM 10 MG PO TABS: ORAL_TABLET | ORAL | 3 refills | Status: DC

## 2023-02-17 MED ORDER — WARFARIN SODIUM 7.5 MG PO TABS: 17.5000 mg | ORAL_TABLET | Freq: Once | ORAL | Status: DC

## 2023-02-17 NOTE — Plan of Care (Signed)
  Problem: Education: Goal: Knowledge of the prescribed therapeutic regimen will improve Outcome: Completed/Met Goal: Ability to verbalize activity precautions or restrictions will improve Outcome: Completed/Met Goal: Understanding of discharge needs will improve Outcome: Completed/Met   Problem: Activity: Goal: Ability to perform//tolerate increased activity and mobilize with assistive devices will improve Outcome: Completed/Met   Problem: Clinical Measurements: Goal: Postoperative complications will be avoided or minimized Outcome: Completed/Met   Problem: Self-Care: Goal: Ability to meet self-care needs will improve Outcome: Completed/Met   Problem: Self-Concept: Goal: Ability to maintain and perform role responsibilities to the fullest extent possible will improve Outcome: Completed/Met   Problem: Pain Management: Goal: Pain level will decrease with appropriate interventions Outcome: Completed/Met   Problem: Skin Integrity: Goal: Demonstration of wound healing without infection will improve Outcome: Completed/Met   Problem: Education: Goal: Ability to describe self-care measures that may prevent or decrease complications (Diabetes Survival Skills Education) will improve Outcome: Completed/Met Goal: Individualized Educational Video(s) Outcome: Completed/Met   Problem: Coping: Goal: Ability to adjust to condition or change in health will improve Outcome: Completed/Met   Problem: Fluid Volume: Goal: Ability to maintain a balanced intake and output will improve Outcome: Completed/Met   Problem: Health Behavior/Discharge Planning: Goal: Ability to identify and utilize available resources and services will improve Outcome: Completed/Met Goal: Ability to manage health-related needs will improve Outcome: Completed/Met   Problem: Metabolic: Goal: Ability to maintain appropriate glucose levels will improve Outcome: Completed/Met   Problem: Nutritional: Goal:  Maintenance of adequate nutrition will improve Outcome: Completed/Met Goal: Progress toward achieving an optimal weight will improve Outcome: Completed/Met   Problem: Skin Integrity: Goal: Risk for impaired skin integrity will decrease Outcome: Completed/Met   Problem: Tissue Perfusion: Goal: Adequacy of tissue perfusion will improve Outcome: Completed/Met   Problem: Education: Goal: Knowledge of General Education information will improve Description: Including pain rating scale, medication(s)/side effects and non-pharmacologic comfort measures Outcome: Completed/Met   Problem: Health Behavior/Discharge Planning: Goal: Ability to manage health-related needs will improve Outcome: Completed/Met   Problem: Clinical Measurements: Goal: Ability to maintain clinical measurements within normal limits will improve Outcome: Completed/Met Goal: Will remain free from infection Outcome: Completed/Met Goal: Diagnostic test results will improve Outcome: Completed/Met Goal: Respiratory complications will improve Outcome: Completed/Met Goal: Cardiovascular complication will be avoided Outcome: Completed/Met   Problem: Activity: Goal: Risk for activity intolerance will decrease Outcome: Completed/Met   Problem: Nutrition: Goal: Adequate nutrition will be maintained Outcome: Completed/Met   Problem: Coping: Goal: Level of anxiety will decrease Outcome: Completed/Met   Problem: Elimination: Goal: Will not experience complications related to bowel motility Outcome: Completed/Met Goal: Will not experience complications related to urinary retention Outcome: Completed/Met   Problem: Pain Managment: Goal: General experience of comfort will improve Outcome: Completed/Met   Problem: Safety: Goal: Ability to remain free from injury will improve Outcome: Completed/Met   Problem: Skin Integrity: Goal: Risk for impaired skin integrity will decrease Outcome: Completed/Met

## 2023-02-17 NOTE — Progress Notes (Signed)
ANTICOAGULATION CONSULT NOTE  Pharmacy Consult for Enoxaparin bridge /Warfarin Indication: mechanical aortic valve  No Known Allergies  Patient Measurements: Height: 5\' 10"  (177.8 cm) Weight: (!) 165.6 kg (365 lb) IBW/kg (Calculated) : 73 Heparin Dosing Weight: 114 kg  Vital Signs: Temp: 98.1 F (36.7 C) (05/23 0841) Temp Source: Oral (05/23 0841) BP: 147/89 (05/23 0841) Pulse Rate: 102 (05/23 0841)  Labs: Recent Labs    02/15/23 0318 02/16/23 0336 02/17/23 0249  HGB 9.1* 9.3* 9.3*  HCT 28.8* 29.0* 29.1*  PLT 226 222 236  LABPROT 15.0 16.5* 18.7*  INR 1.2 1.3* 1.5*  HEPARINUNFRC 0.41 0.40  --   HEPRLOWMOCWT  --   --  0.45    Estimated Creatinine Clearance: 148.1 mL/min (by C-G formula based on SCr of 0.97 mg/dL).   Assessment: 21 YOM with a history of mechanical AVR on warfarin PTA. Recent admission with left BKA stump abscess s/p surgical I&D who was discharged with enoxaparin/warfarin bridge. Patient is presenting with wound check / bleeding. Pharmacy consulted to re-initiate warfarin with heparin bridge on 5/16 as bleeding has stabilized.  Home warfarin regimen is 10mg  T/R/Sa/Su and 5mg  on MWF and was bridging with 160 mg q12h SQ enoxaparin. Patient reports last warfarin dose 5/10 and last enoxaparin dose 5/11 am.  CBC stable with Hgb 9s, pltc stable. Has received blood transfusions 5/12, 5/13 and 5/16 this admission. No stump bleeding or other bleeding reported. INR subtherapeutic/labile at 1.3 s/p 2 doses warfarin 15 mg. Now up to 1.5 s/p x1 17.5mg  dose.  Transitioned to enoxaparin SQ in anticipation of upcoming discharge.   Goal of Therapy:  Anti-Xa level 0.6-1 units/ml INR goal 2-3 Monitor platelets by anticoagulation protocol: Yes   Plan:  Give warfarin 17.5 mg PO x1 dose Enoxaparin 160mg  SQ Q12h  Check INR daily while on warfarin Check Anti-Xa levels PRN while on enoxaparin Continue to monitor H&H and platelets  If discharging, can consider  continuing patient at warfarin 15 mg daily with enoxaparin bridge until follow-up with [facility], preferably in the next 3-4 days.   Thank you for allowing pharmacy to be a part of this patient's care.  Thelma Barge, PharmD Clinical Pharmacist

## 2023-02-17 NOTE — Discharge Summary (Signed)
PATIENT DETAILS Name: Adam Long Age: 47 y.o. Sex: male Date of Birth: 20-Feb-1976 MRN: [id number]. Admitting Physician: Marinda Elk, MD [id number], Verdis Frederickson, DO  Admit Date: 02/06/2023 Discharge date: 02/17/2023  Recommendations for Outpatient Follow-up:  Follow up with PCP in 1-2 weeks Please obtain CMP/CBC in one week Patient to follow-up with Coumadin clinic-on overlapping Lovenox/Coumadin.  Admitted From:  Home  Disposition: Home   Discharge Condition: good  CODE STATUS:   Code Status: Full Code   Diet recommendation:  Diet Order             Diet - low sodium heart healthy           Diet Carb Modified           Diet Carb Modified Fluid consistency: Thin; Room service appropriate? Yes  Diet effective now                    Brief Summary: Patient is a 47 y.o.  male with HTN, DM-2, mechanical aortic valve on Coumadin-who was discharged from the hospital on 5/9 after left BKA MRSA stump infection requiring revision-presented to the hospital with bleeding from his stump site after he bumped his left lower extremity in the kitchen.  He was found to have acute blood loss anemia/AKI and subsequently admitted to the hospitalist service.   Significant events: 5/12>> admit to [facility]   Significant studies: 5/12>> CXR: No PNA   Significant microbiology data: 5/14>> blood culture: No growth   Procedures: None   Consults: ID Orthopedics  Brief Hospital Course: Bleeding from left BKA stump with acute blood loss anemia Bleeding was secondary to mechanical injury (bumped stump in his kitchen) Thankfully bleeding has resolved with supportive care Required 5 units of PRBC-Hb now stable.   AKI Hemodynamically mediated Resolved with supportive care   Left BKA MRSA stump infection IV daptomycin-end date-6/28-continue as previous.   Follow-up with infectious disease as previously scheduled.   Mechanical aortic valve 2021 On overlapping  heparin/Coumadin-INR still subtherapeutic. Switched to Lovenox 5/23-remains on Coumadin-INR slowly creeping up-1.5 today. Patient is anxious to go home-he has Lovenox injections from his prior hospitalization-he claims he will go to his Coumadin clinic 5/24 for an INR check-he understands that he will need to be on Lovenox until his INR is at goal.     HTN BP stable Metoprolol/Norvasc   HLD Statin/Zetia   DM-2 CBG stable Levemir 12 units twice daily here in the hospital-on discharge she will be switched to his usual regimen of Lantus 30 units daily   Mood disorder Stable Zoloft   Morbid Obesity: Estimated body mass index is 52.37 kg/m as calculated from the following:   Height as of this encounter: 5\' 10"  (1.778 m).   Weight as of this encounter: 165.6 kg.   Discharge Diagnoses:  Principal Problem:   Acute blood loss anemia Active Problems:   S/P BKA (below knee amputation), left (HCC)   Cellulitis and abscess of left leg   Uncontrolled type 2 diabetes mellitus with hyperglycemia (HCC)   HTN (hypertension)   Hyperlipidemia associated with type 2 diabetes mellitus (HCC)   Class 3 obesity (HCC)   AKI (acute kidney injury) [facility])   Discharge Instructions:  Activity:  As tolerated   Discharge Instructions     Call MD for:  persistant nausea and vomiting   Complete by: As directed    Call MD for:  redness, tenderness, or signs of infection (pain, swelling, redness, odor or  green/yellow discharge around incision site)   Complete by: As directed    Diet - low sodium heart healthy   Complete by: As directed    Diet Carb Modified   Complete by: As directed    Discharge instructions   Complete by: As directed    Follow with Primary MD  [name], DO in 1-2 weeks  Follow-up with your Coumadin clinic on 5/24-get INR checked-and follow the instructions regarding further dosing of Coumadin/Lovenox.  Please get a complete blood count and chemistry panel checked by your  Primary MD at your next visit, and again as instructed by your Primary MD.  Get Medicines reviewed and adjusted: Please take all your medications with you for your next visit with your Primary MD  Laboratory/radiological data: Please request your Primary MD to go over all hospital tests and procedure/radiological results at the follow up, please ask your Primary MD to get all Hospital records sent to his/her office.  In some cases, they will be blood work, cultures and biopsy results pending at the time of your discharge. Please request that your primary care M.D. follows up on these results.  Also Note the following: If you experience worsening of your admission symptoms, develop shortness of breath, life threatening emergency, suicidal or homicidal thoughts you must seek medical attention immediately by calling 911 or calling your MD immediately  if symptoms less severe.  You must read complete instructions/literature along with all the possible adverse reactions/side effects for all the Medicines you take and that have been prescribed to you. Take any new Medicines after you have completely understood and accpet all the possible adverse reactions/side effects.   Do not drive when taking Pain medications or sleeping medications (Benzodaizepines)  Do not take more than prescribed Pain, Sleep and Anxiety Medications. It is not advisable to combine anxiety,sleep and pain medications without talking with your primary care practitioner  Special Instructions: If you have smoked or chewed Tobacco  in the last 2 yrs please stop smoking, stop any regular Alcohol  and or any Recreational drug use.  Wear Seat belts while driving.  Please note: You were cared for by a hospitalist during your hospital stay. Once you are discharged, your primary care physician will handle any further medical issues. Please note that NO REFILLS for any discharge medications will be authorized once you are discharged, as  it is imperative that you return to your primary care physician (or establish a relationship with a primary care physician if you do not have one) for your post hospital discharge needs so that they can reassess your need for medications and monitor your lab values.   Increase activity slowly   Complete by: As directed    No wound care   Complete by: As directed       Allergies as of 02/17/2023   No Known Allergies      Medication List     TAKE these medications    daptomycin  IVPB Commonly known as: CUBICIN Inject 900 mg into the vein daily. Indication:  BKA site infection  First Dose: Yes Last Day of Therapy:  03/25/23  Labs - Once weekly:  CBC/D, BMP, and CPK Labs - Every other week:  ESR and CRP Method of administration: IV Push Method of administration may be changed at the discretion of home infusion pharmacist based upon assessment of the patient and/or caregiver's ability to self-administer the medication ordered.   enoxaparin 100 MG/ML injection Commonly known as: LOVENOX  Inject 1 mL (100 mg total) into the skin 2 (two) times daily for 10 days. *Use with the 60mg /0.67mL syringe for a total of 160mg /dose* What changed: Another medication with the same name was removed. Continue taking this medication, and follow the directions you see here.   ezetimibe 10 MG tablet Commonly known as: Zetia Take 1 tablet (10 mg total) by mouth daily.   gabapentin 300 MG capsule Commonly known as: NEURONTIN Take 2 capsules (600 mg total) by mouth 2 (two) times daily.   Lantus SoloStar 100 UNIT/ML Solostar Pen Generic drug: insulin glargine Inject 30 Units into the skin daily.   lisinopril 20 MG tablet Commonly known as: ZESTRIL Take 1 tablet (20 mg total) by mouth daily.   metFORMIN 500 MG tablet Commonly known as: GLUCOPHAGE Take 2 tablets (1,000 mg total) by mouth 2 (two) times daily with a meal.   methocarbamol 500 MG tablet Commonly known as: ROBAXIN Take 1 tablet (500 mg  total) by mouth every 6 (six) hours as needed for muscle spasms.   metoprolol tartrate 25 MG tablet Commonly known as: LOPRESSOR Take 1 tablet (25 mg total) by mouth 2 (two) times daily.   oxyCODONE 5 MG immediate release tablet Commonly known as: Oxy IR/ROXICODONE Take 1-2 tablets (5-10 mg total) by mouth every 4 (four) hours as needed for moderate pain (pain score 4-6). What changed: Another medication with the same name was removed. Continue taking this medication, and follow the directions you see here.   pantoprazole 40 MG tablet Commonly known as: PROTONIX Take 1 tablet (40 mg total) by mouth daily.   sertraline 50 MG tablet Commonly known as: ZOLOFT Take 1 tablet (50 mg total) by mouth daily.   TechLite Pen Needles 32G X 4 MM Misc Generic drug: Insulin Pen Needle Use with Lantus flexpen   warfarin 10 MG tablet Commonly known as: COUMADIN Take as directed. If you are unsure how to take this medication, talk to your nurse or doctor. Original instructions: On 5/23-take 15 mg x 1, and starting 5/24-resume your usual regimen of 1 tablet daily except 1/2 tablet on Mondays, Wednesdays and Fridays or as directed What changed: additional instructions   Zinc Sulfate 220 (50 Zn) MG Tabs Take 1 tablet (220 mg total) by mouth daily.        Follow-up Information     [name], DO. Schedule an appointment as soon as possible for a visit in 1 week(s).   Specialty: Family Medicine Contact information: [address] [place] [address] [phone]         Gardiner Barefoot, MD Follow up on 02/22/2023.   Specialty: Infectious Diseases Why: appointment at 9 am Contact information: [address] [place] [address] [phone]         Nadara Mustard, MD Follow up in 1 week(s).   Specialty: Orthopedic Surgery Contact information: [address] [place] [address] [phone]         [facility] [place]. Call on 02/18/2023.    Specialty: Cardiology Why: coumading clinic-INR check Contact information: [address] [id number] [place]ol[place] [address] [phone]               No Known Allergies   Other Procedures/Studies: DG Chest Port 1 View  Result Date: 02/06/2023 CLINICAL DATA:  Questionable sepsis. EXAM: PORTABLE CHEST 1 VIEW COMPARISON:  January 01, 2020 FINDINGS: A right PICC line terminates in the central SVC.  The heart, hila, mediastinum, lungs, and pleura are otherwise normal. IMPRESSION: No active disease. Electronically Signed   By: Gerome Sam III M.D.   On: 02/06/2023 12:35   [place] EKG SITE RITE  Result Date: 01/31/2023 If Site Rite image not attached, placement could not be confirmed due to current cardiac rhythm.  CT TIBIA FIBULA LEFT W CONTRAST  Result Date: 01/23/2023 CLINICAL DATA:  Soft tissue mass, lower leg, deep. Patient reports a wound caused by his prosthetic limb with bleeding for several days. History of diabetes and osteomyelitis. EXAM: CT OF THE LOWER LEFT EXTREMITY WITH CONTRAST TECHNIQUE: Multidetector CT imaging of the left knee was performed according to the standard protocol following intravenous contrast administration. RADIATION DOSE REDUCTION: This exam was performed according to the departmental dose-optimization program which includes automated exposure control, adjustment of the mA and/or kV according to patient size and/or use of iterative reconstruction technique. CONTRAST:  OMNIPAQUE IOHEXOL 300 MG/ML  SOLN COMPARISON:  No recent comparison imaging. Correlation is made with preoperative radiographs of the left foot 08/27/2021 and MRI of the left foot 02/12/2021. FINDINGS: Bones/Joint/Cartilage Patient is status post below the knee amputation. Along the amputation margins of the proximal tibial and fibular diaphyses, there is cortical thickening and periosteal bone formation. No gross cortical destruction to suggest recurrent osteomyelitis.  There is no evidence of acute fracture or dislocation. Tricompartmental degenerative changes are present at the knee without significant joint effusion. Ligaments Suboptimally assessed by CT. Muscles and Tendons Generalized muscular atrophy. The visualized quadriceps and patellar tendons are intact. No focal intramuscular fluid collections are identified. Soft tissues There is an open wound along the lateral aspect of the distal stump with medial extension of ill-defined collections of fluid and gas along the distal aspect of the stump, suspicious for soft tissue abscesses. Largest component measures up to 4.1 x 2.6 cm on image 110/5. There is surrounding subcutaneous edema and overlying dermal thickening. No focal fluid collections are seen more proximally. There is generalized subcutaneous edema within the lower leg. Prominent vascular calcifications are noted. IMPRESSION: 1. Open wound along the lateral aspect of the distal stump with medial extension of ill-defined collections of fluid and gas along the distal aspect of the stump, suspicious for soft tissue abscesses. Largest component measures up to 4.1 x 2.6 cm. 2. No gross cortical destruction to suggest recurrent osteomyelitis. There is cortical thickening and periosteal bone formation along the amputation margins of the proximal tibial and fibular diaphyses. 3. Generalized subcutaneous edema within the lower leg. 4. Left knee degenerative changes without significant joint effusion. Electronically Signed   By: Carey Bullocks M.D.   On: 01/23/2023 16:40     TODAY-DAY OF DISCHARGE:  Subjective:   Adam Long today has no headache,no chest abdominal pain,no new weakness tingling or numbness, feels much better wants to go home today.   Objective:   Blood pressure 121/75, pulse 80, temperature 98.1 F (36.7 C), temperature source Oral, resp. rate 17, height 5\' 10"  (1.778 m), weight (!) 165.6 kg, SpO2 96 %.  Intake/Output Summary (Last 24 hours)  at 02/17/2023 0935 Last data filed at 02/17/2023 0843 Gross per 24 hour  Intake 480 ml  Output 2701 ml  Net -2221 ml   Filed Weights   02/06/23 1051  Weight: (!) 165.6 kg    Exam: Awake Alert, Oriented *3, No new F.N deficits, Normal affect .AT,PERRAL Supple Neck,No JVD, No cervical lymphadenopathy appriciated.  Symmetrical Chest wall movement, Good air movement bilaterally, CTAB RRR,No  Gallops,Rubs or new Murmurs, No Parasternal Heave +ve B.Sounds, Abd Soft, Non tender, No organomegaly appriciated, No rebound -guarding or rigidity. No Cyanosis, Clubbing or edema, No new Rash or bruise   PERTINENT RADIOLOGIC STUDIES: No results found.   PERTINENT LAB RESULTS: CBC: Recent Labs    02/16/23 0336 02/17/23 0249  WBC 6.5 8.3  HGB 9.3* 9.3*  HCT 29.0* 29.1*  PLT 222 236   CMET CMP     Component Value Date/Time   NA 133 (L) 02/14/2023 0415   NA 136 06/04/2021 0909   K 3.6 02/14/2023 0415   CL 100 02/14/2023 0415   CO2 24 02/14/2023 0415   GLUCOSE 168 (H) 02/14/2023 0415   BUN 14 02/14/2023 0415   BUN 12 06/04/2021 0909   CREATININE 0.97 02/14/2023 0415   CREATININE 0.75 06/13/2020 0837   CALCIUM 8.8 (L) 02/14/2023 0415   PROT 5.8 (L) 02/06/2023 1110   PROT 7.3 06/04/2021 0909   ALBUMIN 2.4 (L) 02/06/2023 1110   ALBUMIN 3.8 (L) 06/04/2021 0909   AST 23 02/06/2023 1110   ALT 22 02/06/2023 1110   ALKPHOS 42 02/06/2023 1110   BILITOT 0.7 02/06/2023 1110   BILITOT 0.8 06/04/2021 0909   GFRNONAA >60 02/14/2023 0415   GFRNONAA 112 06/13/2020 0837   GFRAA 126 10/31/2020 0944   GFRAA 129 06/13/2020 0837    GFR Estimated Creatinine Clearance: 148.1 mL/min (by C-G formula based on SCr of 0.97 mg/dL). No results for input(s): "LIPASE", "AMYLASE" in the last 72 hours. No results for input(s): "CKTOTAL", "CKMB", "CKMBINDEX", "TROPONINI" in the last 72 hours. Invalid input(s): "POCBNP" No results for input(s): "DDIMER" in the last 72 hours. No results for input(s):  "HGBA1C" in the last 72 hours. No results for input(s): "CHOL", "HDL", "LDLCALC", "TRIG", "CHOLHDL", "LDLDIRECT" in the last 72 hours. No results for input(s): "TSH", "T4TOTAL", "T3FREE", "THYROIDAB" in the last 72 hours.  Invalid input(s): "FREET3" No results for input(s): "VITAMINB12", "FOLATE", "FERRITIN", "TIBC", "IRON", "RETICCTPCT" in the last 72 hours. Coags: Recent Labs    02/16/23 0336 02/17/23 0249  INR 1.3* 1.5*   Microbiology: Recent Results (from the past 240 hour(s))  Culture, blood (Routine X 2) w Reflex to ID Panel     Status: None   Collection Time: 02/08/23  4:27 AM   Specimen: BLOOD LEFT HAND  Result Value Ref Range Status   Specimen Description BLOOD LEFT HAND  Final   Special Requests   Final    BOTTLES DRAWN AEROBIC AND ANAEROBIC Blood Culture adequate volume   Culture   Final    NO GROWTH 5 DAYS Performed at [facility], [address], [place], [place] [address]    Report Status 02/13/2023 FINAL  Final  Culture, blood (Routine X 2) w Reflex to ID Panel     Status: None   Collection Time: 02/08/23  4:29 AM   Specimen: BLOOD LEFT HAND  Result Value Ref Range Status   Specimen Description BLOOD LEFT HAND  Final   Special Requests   Final    BOTTLES DRAWN AEROBIC AND ANAEROBIC Blood Culture results may not be optimal due to an excessive volume of blood received in culture bottles   Culture   Final    NO GROWTH 5 DAYS Performed at [facility], [address] [place], [place] [address]    Report Status 02/13/2023 FINAL  Final    FURTHER DISCHARGE INSTRUCTIONS:  Get Medicines reviewed and adjusted: Please take all your medications with  you for your next visit with your Primary MD  Laboratory/radiological data: Please request your Primary MD to go over all hospital tests and procedure/radiological results at the follow up, please ask your Primary MD to get all Hospital records sent to his/her office.  In some cases, they will be blood  work, cultures and biopsy results pending at the time of your discharge. Please request that your primary care M.D. goes through all the records of your hospital data and follows up on these results.  Also Note the following: If you experience worsening of your admission symptoms, develop shortness of breath, life threatening emergency, suicidal or homicidal thoughts you must seek medical attention immediately by calling 911 or calling your MD immediately  if symptoms less severe.  You must read complete instructions/literature along with all the possible adverse reactions/side effects for all the Medicines you take and that have been prescribed to you. Take any new Medicines after you have completely understood and accpet all the possible adverse reactions/side effects.   Do not drive when taking Pain medications or sleeping medications (Benzodaizepines)  Do not take more than prescribed Pain, Sleep and Anxiety Medications. It is not advisable to combine anxiety,sleep and pain medications without talking with your primary care practitioner  Special Instructions: If you have smoked or chewed Tobacco  in the last 2 yrs please stop smoking, stop any regular Alcohol  and or any Recreational drug use.  Wear Seat belts while driving.  Please note: You were cared for by a hospitalist during your hospital stay. Once you are discharged, your primary care physician will handle any further medical issues. Please note that NO REFILLS for any discharge medications will be authorized once you are discharged, as it is imperative that you return to your primary care physician (or establish a relationship with a primary care physician if you do not have one) for your post hospital discharge needs so that they can reassess your need for medications and monitor your lab values.  Total Time spent coordinating discharge including counseling, education and face to face time equals greater than 30  minutes.  SignedJeoffrey Massed 02/17/2023 9:35 AM

## 2023-02-17 NOTE — Telephone Encounter (Signed)
Contacted Fleet Contras with [facility] [phone] concerning resumption Home IV Antibiotic infusion and HH services. Orders were faxed to [phone] fax  receipt confirmed.

## 2023-02-17 NOTE — TOC Transition Note (Signed)
Transition of Care [facility]) - CM/SW Discharge Note   Patient Details  Name: RANALDO BARASCH MRN: [id number] Date of Birth: 1976/02/25  Transition of Care [facility]) CM/SW Contact:  Gordy Clement, RN Phone Number: 02/17/2023, 11:00 AM   Clinical Narrative:    Patient will dc to home on IVABX  [facility] will be providing the IV medications as well as the Home Health.  Patient's Cousin will be transporting  No additional TOC needs             Patient Goals and CMS Choice      Discharge Placement                         Discharge Plan and Services Additional resources added to the After Visit Summary for                                       Social Determinants of Health (SDOH) Interventions SDOH Screenings   Food Insecurity: No Food Insecurity (02/06/2023)  Housing: Low Risk  (02/06/2023)  Transportation Needs: No Transportation Needs (02/06/2023)  Utilities: Not At Risk (02/06/2023)  Depression (PHQ2-9): Medium Risk (10/12/2022)  Tobacco Use: Medium Risk (02/06/2023)     Readmission Risk Interventions     No data to display

## 2023-02-18 ENCOUNTER — Other Ambulatory Visit (HOSPITAL_COMMUNITY)
Admission: RE | Admit: 2023-02-18 | Discharge: 2023-02-18 | Disposition: A | Discharge status: Home / Self Care | Payer: Medicaid Other | Source: Ambulatory Visit | Attending: Cardiology | Admitting: Cardiology

## 2023-02-18 ENCOUNTER — Ambulatory Visit (INDEPENDENT_AMBULATORY_CARE_PROVIDER_SITE_OTHER): Payer: Medicaid Other | Admitting: *Deleted

## 2023-02-18 ENCOUNTER — Encounter: Payer: Self-pay | Admitting: Cardiology

## 2023-02-18 ENCOUNTER — Other Ambulatory Visit: Payer: Self-pay | Admitting: *Deleted

## 2023-02-18 DIAGNOSIS — Z954 Presence of other heart-valve replacement: Secondary | ICD-10-CM

## 2023-02-18 DIAGNOSIS — Z952 Presence of prosthetic heart valve: Secondary | ICD-10-CM | POA: Insufficient documentation

## 2023-02-18 DIAGNOSIS — Z5181 Encounter for therapeutic drug level monitoring: Secondary | ICD-10-CM | POA: Diagnosis not present

## 2023-02-18 DIAGNOSIS — Z0000 Encounter for general adult medical examination without abnormal findings: Secondary | ICD-10-CM

## 2023-02-18 LAB — PROTIME-INR
INR: 1.9 — ABNORMAL HIGH (ref 0.8–1.2)
Prothrombin Time: 22.4 seconds — ABNORMAL HIGH (ref 11.4–15.2)

## 2023-02-18 NOTE — Telephone Encounter (Signed)
Error

## 2023-02-18 NOTE — Progress Notes (Signed)
Received a call from Scheduler stating the pt was at Admitting and needed a lab placed for INR.  Scheduler stated she was told on yesterday by RN Misty Stanley that they would be able to draw the lab with order that is in the chart and pt would need to go to the lab. Assessed the chart and no lab draw was in just then normal in clinic POC INR. Advised that the lab cannot draw a lab from a POC order as those are done in Anticoagulation Clinic. I placed an order for an INR lab to be done.  There is no documentation in the chart in reference to the above therefore, the Scheduler had to be put on hold while I assessed the chart and placed the order.   This pt has been in the Hospital from 02/06/23-02/17/23 with acute blood loss anemia and d/c'd on lovenox (need to be therapeutic before stopping per d/c summary).  Will add pt to follow as he is getting an INR done today if order successful.

## 2023-02-18 NOTE — Patient Instructions (Signed)
Description   Spoke with pt and advised to continue Lovenox injections until tonight and take 1/2 tablet of warfarin tomorrow then continue warfarin 1 tablet daily expcet for 1/2 tablet on Monday, Wednesday and Friday. Recheck INR on Tuesday in [place] office.

## 2023-02-21 ENCOUNTER — Telehealth: Payer: Self-pay

## 2023-02-21 NOTE — Transitions of Care (Post Inpatient/ED Visit) (Signed)
02/21/2023  Name: Adam Long MRN: [id number] DOB: 05-19-1976  Today's TOC FU Call Status: Today's TOC FU Call Status:: Successful TOC FU Call Competed TOC FU Call Complete Date: 02/21/23  Transition Care Management Follow-up Telephone Call Date of Discharge: 02/17/23 Discharge Facility: [facility]) Type of Discharge: Inpatient Admission Primary Inpatient Discharge Diagnosis:: anemia How have you been since you were released from the hospital?: Better Any questions or concerns?: No  Items Reviewed: Did you receive and understand the discharge instructions provided?: Yes Medications obtained,verified, and reconciled?: Yes (Medications Reviewed) Any new allergies since your discharge?: No Dietary orders reviewed?: Yes Do you have support at home?: Yes People in Home: spouse  Medications Reviewed Today: Medications Reviewed Today     Reviewed by Karena Addison, LPN (Licensed Practical Nurse) on 02/21/23 at 1437  Med List Status: <None>   Medication Order Taking? Sig Documenting Provider Last Dose Status Informant  daptomycin (CUBICIN) IVPB [id number] Yes Inject 900 mg into the vein daily. Indication:  BKA site infection  First Dose: Yes Last Day of Therapy:  03/25/23  Labs - Once weekly:  CBC/D, BMP, and CPK Labs - Every other week:  ESR and CRP Method of administration: IV Push Method of administration may be changed at the discretion of home infusion pharmacist based upon assessment of the patient and/or caregiver's ability to self-administer the medication ordered. Elease Etienne, MD Taking Active Self  enoxaparin (LOVENOX) 100 MG/ML injection [id number]  Inject 1 mL (100 mg total) into the skin 2 (two) times daily for 10 days. *Use with the 60mg /0.55mL syringe for a total of 160mg /doseRhetta Mura, MD  Expired 02/13/23 2359 Self           Med Note [facility] Feb 07, 2023 12:26 PM) Patient states that he thinks this medication is on hold.   ezetimibe (ZETIA) 10 MG tablet [id number] Yes Take 1 tablet (10 mg total) by mouth daily. [name], DO Taking Active Self  gabapentin (NEURONTIN) 300 MG capsule [id number] Yes Take 2 capsules (600 mg total) by mouth 2 (two) times daily. [name], DO Taking Active Self  insulin glargine (LANTUS) 100 UNIT/ML Solostar Pen [id number] Yes Inject 30 Units into the skin daily. Rhetta Mura, MD Taking Active Self  Insulin Pen Needle 32G X 4 MM MISC [id number] Yes Use with Lantus flexpen Rhetta Mura, MD Taking Active Self  lisinopril (ZESTRIL) 20 MG tablet [id number] Yes Take 1 tablet (20 mg total) by mouth daily. [name], DO Taking Active Self  metFORMIN (GLUCOPHAGE) 500 MG tablet [id number] Yes Take 2 tablets (1,000 mg total) by mouth 2 (two) times daily with a meal. [name], DO Taking Active Self  methocarbamol (ROBAXIN) 500 MG tablet [id number] Yes Take 1 tablet (500 mg total) by mouth every 6 (six) hours as needed for muscle spasms. Rhetta Mura, MD Taking Active Self  metoprolol tartrate (LOPRESSOR) 25 MG tablet [id number] Yes Take 1 tablet (25 mg total) by mouth 2 (two) times daily. [name], DO Taking Active Self  oxyCODONE (OXY IR/ROXICODONE) 5 MG immediate release tablet [id number] Yes Take 1-2 tablets (5-10 mg total) by mouth every 4 (four) hours as needed for moderate pain (pain score 4-6). Rhetta Mura, MD Taking Active Self  pantoprazole (PROTONIX) 40 MG tablet [id number] Yes Take 1 tablet (40 mg total) by mouth daily. [name], DO Taking Active Self  sertraline (ZOLOFT) 50 MG tablet [id number] Yes Take 1 tablet (  50 mg total) by mouth daily. [name], DO Taking Active Self  warfarin (COUMADIN) 10 MG tablet [id number] Yes On 5/23-take 15 mg x 1, and starting 5/24-resume your usual regimen of 1 tablet daily except 1/2 tablet on Mondays, Wednesdays and Fridays or as directed Maretta Bees, MD Taking Active   Zinc Sulfate 220 (50 Zn) MG  TABS [id number] Yes Take 1 tablet (220 mg total) by mouth daily. Rhetta Mura, MD Taking Active Self            Home Care and Equipment/Supplies: Were Home Health Services Ordered?: NA Any new equipment or medical supplies ordered?: NA Were you able to get the equipment/medical supplies?: No Do you have any questions related to the use of the equipment/supplies?: No  Functional Questionnaire: Do you need assistance with bathing/showering or dressing?: No Do you need assistance with meal preparation?: No Do you need assistance with eating?: No Do you have difficulty maintaining continence: No Do you need assistance with getting out of bed/getting out of a chair/moving?: No Do you have difficulty managing or taking your medications?: No  Follow up appointments reviewed: PCP Follow-up appointment confirmed?: Yes Date of PCP follow-up appointment?: 02/25/23 Follow-up Provider: [facility] Follow-up appointment confirmed?: Yes Date of Specialist follow-up appointment?: 02/22/23 Follow-Up Specialty Provider:: Dr Luciana Axe ID Do you need transportation to your follow-up appointment?: No Do you understand care options if your condition(s) worsen?: Yes-patient verbalized understanding    SIGNATURE Karena Addison, LPN [facility] Nurse Health Advisor Direct Dial [phone]

## 2023-02-22 ENCOUNTER — Encounter: Payer: Self-pay | Admitting: Internal Medicine

## 2023-02-22 ENCOUNTER — Other Ambulatory Visit: Payer: Self-pay

## 2023-02-22 ENCOUNTER — Ambulatory Visit: Payer: Medicaid Other | Attending: Cardiology | Admitting: *Deleted

## 2023-02-22 ENCOUNTER — Encounter: Payer: Self-pay | Admitting: Cardiology

## 2023-02-22 ENCOUNTER — Ambulatory Visit (INDEPENDENT_AMBULATORY_CARE_PROVIDER_SITE_OTHER): Payer: Medicaid Other | Admitting: Internal Medicine

## 2023-02-22 ENCOUNTER — Telehealth: Payer: Self-pay

## 2023-02-22 VITALS — BP 120/76 | HR 113 | Resp 16 | Ht 70.0 in | Wt 365.0 lb

## 2023-02-22 DIAGNOSIS — L03116 Cellulitis of left lower limb: Secondary | ICD-10-CM

## 2023-02-22 DIAGNOSIS — Z5181 Encounter for therapeutic drug level monitoring: Secondary | ICD-10-CM | POA: Diagnosis not present

## 2023-02-22 DIAGNOSIS — Z952 Presence of prosthetic heart valve: Secondary | ICD-10-CM

## 2023-02-22 DIAGNOSIS — L02416 Cutaneous abscess of left lower limb: Secondary | ICD-10-CM

## 2023-02-22 LAB — POCT INR
INR: 2.3 (ref 2.0–3.0)
INR: 2.3 (ref 2.0–3.0)

## 2023-02-22 NOTE — Assessment & Plan Note (Signed)
Deep infection and concern with osteomyelitis.  Healing well now after his recent hospitalization for bleeding.   No issues with the antibiotics and no changes to the plan.   Will have him continue through 6/28 and have the picc line pulled after that and follow up with [place] after that.    I have personally spent 42 minutes involved in face-to-face and non-face-to-face activities for this patient on the day of the visit. Professional time spent includes the following activities: Preparing to see the patient (review of tests and particularly of his recent hospitalization), Obtaining and/or reviewing separately obtained history (admission/discharge record), Performing a medically appropriate examination and/or evaluation , Ordering medications/tests/procedures, referring and communicating with other health care professionals, Documenting clinical information in the EMR, Independently interpreting results (not separately reported), Communicating results to the patient/family/caregiver, Counseling and educating the patient/family/caregiver and Care coordination (not separately reported).

## 2023-02-22 NOTE — Patient Instructions (Signed)
Continue warfarin 1 tablet daily expcet for 1/2 tablet on Monday, Wednesday and Friday. Recheck in 2 wks.

## 2023-02-22 NOTE — Progress Notes (Signed)
   Subjective:    Patient ID: Adam Long, male    DOB: [birth year], 47 y.o.   MRN: [id number]  HPI Mr Adam Long is here for hsfu He has a history of MRSA and GBS bacteremia and in April came in to the hospital with a stump infection due to a poorly fitted prosthesis.  He underwent I and D for abscess and concern for osteomyelitis so put on daptomycin for a 6 week course through June 28th.  His course was complicated by a rehospitalization for bleeding at the stump site and required stabilization.  He has a history of a prosthetic valve and on anticoagulation.  He now is doing better with no issues with the picc line and no rash or diarrhea.  Sees Dr. Lajoyce Corners this Friday.    Review of Systems  Constitutional:  Negative for fatigue.  Gastrointestinal:  Negative for diarrhea and nausea.  Skin:  Negative for rash.       Objective:   Physical Exam Eyes:     General: No scleral icterus. Pulmonary:     Effort: Pulmonary effort is normal.  Musculoskeletal:     Comments: Left stump wrapped  Neurological:     Mental Status: He is alert.           Assessment & Plan:

## 2023-02-22 NOTE — Telephone Encounter (Addendum)
Per Dr Luciana Axe: Rip [place] to PULL PICC after last dose of IV abx on 03/25/23.  Attached [facility] and [facility] for OPAT tracking.

## 2023-02-25 ENCOUNTER — Encounter: Payer: Self-pay | Admitting: Family

## 2023-02-25 ENCOUNTER — Encounter (INDEPENDENT_AMBULATORY_CARE_PROVIDER_SITE_OTHER): Payer: Self-pay

## 2023-02-25 ENCOUNTER — Ambulatory Visit (INDEPENDENT_AMBULATORY_CARE_PROVIDER_SITE_OTHER): Payer: Medicaid Other | Admitting: Family Medicine

## 2023-02-25 ENCOUNTER — Other Ambulatory Visit: Payer: Self-pay | Admitting: Cardiology

## 2023-02-25 ENCOUNTER — Ambulatory Visit (INDEPENDENT_AMBULATORY_CARE_PROVIDER_SITE_OTHER): Payer: Medicaid Other | Admitting: Family

## 2023-02-25 VITALS — BP 115/70 | HR 103 | Temp 98.1°F

## 2023-02-25 DIAGNOSIS — T8781 Dehiscence of amputation stump: Secondary | ICD-10-CM

## 2023-02-25 DIAGNOSIS — I10 Essential (primary) hypertension: Secondary | ICD-10-CM | POA: Diagnosis not present

## 2023-02-25 DIAGNOSIS — E1165 Type 2 diabetes mellitus with hyperglycemia: Secondary | ICD-10-CM

## 2023-02-25 DIAGNOSIS — Z794 Long term (current) use of insulin: Secondary | ICD-10-CM | POA: Diagnosis not present

## 2023-02-25 DIAGNOSIS — D62 Acute posthemorrhagic anemia: Secondary | ICD-10-CM

## 2023-02-25 MED ORDER — GABAPENTIN 300 MG PO CAPS: 600.0000 mg | ORAL_CAPSULE | Freq: Two times a day (BID) | ORAL | 3 refills | Status: DC

## 2023-02-25 NOTE — Progress Notes (Signed)
Post-Op Visit Note   Patient: Adam Long           Date of Birth: 1976-08-18           MRN: [id number] Visit Date: 02/25/2023 PCP: [name], DO  Chief Complaint:  Chief Complaint  Patient presents with   Left Leg - Routine Post Op    01/29/23 left bka revision    HPI:  HPI The patient is a 47 year old gentleman who is seen status post left below-knee amputation revision May 4 has been doing home dry dressing changes.  Denies falls. Ortho Exam On examination left residual limb unfortunately the lateral aspect of his incision has dehisced this is open and 2 cm in width 15 mm in depth there is no exposed bone or tendon there is no necrotic tissue no surrounding erythema or maceration Visit Diagnoses: No diagnosis found.  Plan: He will continue with his antibiotics via his PICC line.  Is following with ID has an appointment in 2 weeks.  Continue daily dose of cleansing discussed packing open with dry gauze he will follow-up in the office in 10 days have requested he began wearing a shrinker as well.  Follow-Up Instructions: Return in about 10 years (around 02/24/2033).   Imaging: No results found.  Orders:  No orders of the defined types were placed in this encounter.  No orders of the defined types were placed in this encounter.    PMFS History: Patient Active Problem List   Diagnosis Date Noted   Sepsis (HCC) 02/06/2023   AKI (acute kidney injury) (HCC) 02/06/2023   Acute blood loss anemia 02/06/2023   GERD (gastroesophageal reflux disease) 01/24/2023   Hypokalemia 01/24/2023   Cellulitis and abscess of left leg 01/23/2023   History of osteomyelitis 10/12/2022   Uncontrolled type 2 diabetes mellitus with hyperglycemia (HCC) 10/12/2022   CAD (coronary artery disease) 10/12/2022   Facial skin lesion 10/12/2022   Encounter for therapeutic drug monitoring 10/12/2022   Iron deficiency anemia 04/01/2022   S/P BKA (below knee amputation), left (HCC) 10/30/2021    Dehiscence of amputation stump of left lower extremity (HCC)    Anxiety 09/30/2020   Preventative health care 12/13/2019   S/P aortic valve replacement with mechanical valve 12/05/2019   Hyperlipidemia associated with type 2 diabetes mellitus (HCC) 10/12/2015   Class 3 obesity (HCC) 10/12/2015   HTN (hypertension) 07/02/2013   Past Medical History:  Diagnosis Date   Anxiety    Aortic stenosis    Cellulitis and abscess of lower extremity 06/11/2019   Cellulitis of fourth toe of left foot    Cholelithiasis    Coronary artery disease    Nonobstructive CAD (40-50% LAD) 08/2019   Depression    Diabetes mellitus without complication (HCC)    Phreesia 09/27/2020   Elevated troponin level not due myocardial infarction 11/11/2019   Essential hypertension    Gangrene of toe of left foot (HCC) 07/06/2019   Heart murmur    Phreesia 09/27/2020   Hyperlipidemia    Phreesia 09/27/2020   Hypertension    Phreesia 09/27/2020   Mixed hyperlipidemia    Morbid obesity (HCC)    S/P aortic valve replacement with mechanical valve 12/05/2019   25 mm Carbomedics top hat bileaflet mechanical valve via partial upper hemi-sternotomy   Severe aortic stenosis 09/24/2019   Type 2 diabetes mellitus (HCC)     Family History  Problem Relation Age of Onset   Cancer Mother  Brain tumor   Heart disease Father    Hyperlipidemia Father    Hypertension Father    Stroke Father    Heart murmur Sister    Heart attack Maternal Grandmother    Liver disease Maternal Grandfather    Breast cancer Paternal Grandmother    COPD Paternal Grandfather    Heart attack Maternal Aunt    Colon cancer Maternal Uncle 67   Pancreatic cancer Paternal Aunt    Heart disease Paternal Uncle    Prostate cancer Neg Hx     Past Surgical History:  Procedure Laterality Date   ABDOMINAL AORTOGRAM W/LOWER EXTREMITY N/A 07/06/2019   Procedure: ABDOMINAL AORTOGRAM W/LOWER EXTREMITY;  Surgeon: Sherren Kerns, MD;  Location: [facility]  INVASIVE CV LAB;  Service: Cardiovascular;  Laterality: N/A;  Bilateral   AMPUTATION Left 07/09/2019   Procedure: LEFT FOURTH and Fifth TOE AMPUTATION.;  Surgeon: Larina Earthly, MD;  Location: [facility] OR;  Service: Vascular;  Laterality: Left;   AMPUTATION Left 03/11/2021   Procedure: LEFT FOOT 5TH  AND 4TH RAY AMPUTATION;  Surgeon: Nadara Mustard, MD;  Location: [facility] OR;  Service: Orthopedics;  Laterality: Left;   AMPUTATION Left 08/28/2021   Procedure: AMPUTATION BELOW KNEE;  Surgeon: Nadara Mustard, MD;  Location: [facility] OR;  Service: Orthopedics;  Laterality: Left;   AMPUTATION Left 01/29/2023   Procedure: LEFT AMPUTATION BELOW KNEE REVISION AND CLOSURE;  Surgeon: Nadara Mustard, MD;  Location: [facility] OR;  Service: Orthopedics;  Laterality: Left;   AORTIC VALVE REPLACEMENT N/A 12/05/2019   Procedure: PARTIAL STERNOTOMY FOR AORTIC VALVE REPLACEMENT (AVR), USING CARBOMEDICS SUPRA-ANNULAR TOP HAT ;  Surgeon: Purcell Nails, MD;  Location: [facility] OR;  Service: Open Heart Surgery;  Laterality: N/A;  No neck lines on left   APPLICATION OF WOUND VAC Left 10/30/2021   Procedure: APPLICATION OF WOUND VAC;  Surgeon: Nadara Mustard, MD;  Location: [facility] OR;  Service: Orthopedics;  Laterality: Left;   APPLICATION OF WOUND VAC Left 12/25/2021   Procedure: APPLICATION OF WOUND VAC;  Surgeon: Nadara Mustard, MD;  Location: [facility] OR;  Service: Orthopedics;  Laterality: Left;   APPLICATION OF WOUND VAC Left 01/26/2023   Procedure: APPLICATION OF WOUND VAC TO LEFT STUMP;  Surgeon: Tarry Kos, MD;  Location: [facility] OR;  Service: Orthopedics;  Laterality: Left;   CARDIAC VALVE REPLACEMENT N/A    Phreesia 09/27/2020   I & D EXTREMITY Left 01/26/2023   Procedure: IRRIGATION AND DEBRIDEMENT OF LEFT BKA STUMP;  Surgeon: Tarry Kos, MD;  Location: [facility] OR;  Service: Orthopedics;  Laterality: Left;   IR RADIOLOGY PERIPHERAL GUIDED IV START  10/05/2019   IR US GUIDE VASC ACCESS RIGHT  10/05/2019   MULTIPLE EXTRACTIONS WITH ALVEOLOPLASTY N/A 10/26/2019    Procedure: EXTRACTION OF TOOTH #'S 3, 5-11,19-28,  AND 32 WITH ALVEOLOPLASTY;  Surgeon: Charlynne Pander, DDS;  Location: [facility] OR;  Service: Oral Surgery;  Laterality: N/A;   RIGHT HEART CATH AND CORONARY ANGIOGRAPHY N/A 09/24/2019   Procedure: RIGHT HEART CATH AND CORONARY ANGIOGRAPHY;  Surgeon: Tonny Bollman, MD;  Location: [facility] INVASIVE CV LAB;  Service: Cardiovascular;  Laterality: N/A;   STUMP REVISION Left 10/30/2021   Procedure: REVISION LEFT BELOW KNEE AMPUTATION;  Surgeon: Nadara Mustard, MD;  Location: [facility] OR;  Service: Orthopedics;  Laterality: Left;   STUMP REVISION Left 12/25/2021   Procedure: REVISION LEFT BELOW KNEE AMPUTATION;  Surgeon: Nadara Mustard, MD;  Location: [facility] OR;  Service: Orthopedics;  Laterality:  Left;   TEE WITHOUT CARDIOVERSION N/A 12/05/2019   Procedure: TRANSESOPHAGEAL ECHOCARDIOGRAM (TEE);  Surgeon: Purcell Nails, MD;  Location: [facility] OR;  Service: Open Heart Surgery;  Laterality: N/A;   TEE WITHOUT CARDIOVERSION N/A 09/01/2021   Procedure: TRANSESOPHAGEAL ECHOCARDIOGRAM (TEE);  Surgeon: Little Ishikawa, MD;  Location: [facility] ENDOSCOPY;  Service: Cardiovascular;  Laterality: N/A;   Social History   Occupational History    Comment: Glass blower/designer- self-employed  Tobacco Use   Smoking status: Never   Smokeless tobacco: Former    Types: Chew    Quit date: 2021  Vaping Use   Vaping Use: Never used  Substance and Sexual Activity   Alcohol use: Yes    Alcohol/week: 4.0 standard drinks of alcohol    Types: 4 Cans of beer per week    Comment: occasionally   Drug use: No   Sexual activity: Yes    Birth control/protection: None

## 2023-02-25 NOTE — Patient Instructions (Signed)
Continue your medications.  Follow up in 1 month.  Take care  Dr. Adriana Simas

## 2023-02-27 NOTE — Assessment & Plan Note (Signed)
Improving. Continue Insulin and Metformin.

## 2023-02-27 NOTE — Progress Notes (Signed)
Subjective:  Patient ID: Adam Long, male    DOB: 04-23-1976  Age: 47 y.o. MRN: [id number]  CC: Chief Complaint  Patient presents with   left leg stump dehisence and anemia follow up     HPI:  47 year old male with morbid Obesity, HTN, CAD, HLD, Uncontrolled Type 2 Diabetes presents for follow up.  2 recent admissions - 1st for infection of BKA stump which required surgical revision, 2nd for Acute blood loss anemia (bleeding from the wound after mechanical injury).  He presents today for follow up. He required 5 units of PRBC's during last admission. He is also on IV antibiotics still from prior admission. Following with ID. He is feeling much better. No bleeding from the wound at this time. Has followed up with Ortho regarding his wound.  He was recently started on insulin therapy. Sugars are improving. No hypoglycemia.   BP well controlled on Lisinopril and Metoprolol.   Patient Active Problem List   Diagnosis Date Noted   Acute blood loss anemia 02/06/2023   GERD (gastroesophageal reflux disease) 01/24/2023   Cellulitis and abscess of left leg 01/23/2023   History of osteomyelitis 10/12/2022   Uncontrolled type 2 diabetes mellitus with hyperglycemia (HCC) 10/12/2022   CAD (coronary artery disease) 10/12/2022   Encounter for therapeutic drug monitoring 10/12/2022   S/P BKA (below knee amputation), left (HCC) 10/30/2021   Dehiscence of amputation stump of left lower extremity (HCC)    Anxiety 09/30/2020   Preventative health care 12/13/2019   S/P aortic valve replacement with mechanical valve 12/05/2019   Hyperlipidemia associated with type 2 diabetes mellitus (HCC) 10/12/2015   Class 3 obesity (HCC) 10/12/2015   HTN (hypertension) 07/02/2013    Social Hx   Social History   Socioeconomic History   Marital status: Single    Spouse name: Not on file   Number of children: Not on file   Years of education: Not on file   Highest education level: 12th grade   Occupational History    Comment: Glass blower/designer- self-employed  Tobacco Use   Smoking status: Never   Smokeless tobacco: Former    Types: Chew    Quit date: 2021  Vaping Use   Vaping Use: Never used  Substance and Sexual Activity   Alcohol use: Yes    Alcohol/week: 4.0 standard drinks of alcohol    Types: 4 Cans of beer per week    Comment: occasionally   Drug use: No   Sexual activity: Yes    Birth control/protection: None  Other Topics Concern   Not on file  Social History Narrative   Lives with his friend.    Social Determinants of Health   Financial Resource Strain: Medium Risk (02/21/2023)   Overall Financial Resource Strain (CARDIA)    Difficulty of Paying Living Expenses: Somewhat hard  Food Insecurity: No Food Insecurity (02/21/2023)   Hunger Vital Sign    Worried About Running Out of Food in the Last Year: Never true    Ran Out of Food in the Last Year: Never true  Transportation Needs: No Transportation Needs (02/21/2023)   PRAPARE - Administrator, Civil Service (Medical): No    Lack of Transportation (Non-Medical): No  Physical Activity: Insufficiently Active (02/21/2023)   Exercise Vital Sign    Days of Exercise per Week: 1 day    Minutes of Exercise per Session: 10 min  Stress: Stress Concern Present (02/21/2023)   [employer] of Occupational Health -  Occupational Stress Questionnaire    Feeling of Stress : Rather much  Social Connections: Socially Isolated (02/21/2023)   Social Connection and Isolation Panel [NHANES]    Frequency of Communication with Friends and Family: More than three times a week    Frequency of Social Gatherings with Friends and Family: Twice a week    Attends Religious Services: Never    Database administrator or Organizations: No    Attends Engineer, structural: Not on file    Marital Status: Never married    Review of Systems Per HPI  Objective:  BP 115/70   Pulse (!) 103   Temp 98.1 F (36.7  C)   SpO2 97%      02/25/2023    3:13 PM 02/22/2023    9:05 AM 02/17/2023   10:50 AM  BP/Weight  Systolic BP 115 120 125  Diastolic BP 70 76 82  Wt. (Lbs)  365   BMI  52.37 kg/m2     Physical Exam Vitals and nursing note reviewed.  Constitutional:      General: He is not in acute distress.    Appearance: Normal appearance. He is obese.  HENT:     Head: Normocephalic and atraumatic.  Cardiovascular:     Rate and Rhythm: Normal rate and regular rhythm.     Heart sounds: Murmur heard.  Pulmonary:     Effort: Pulmonary effort is normal.     Breath sounds: Normal breath sounds. No wheezing, rhonchi or rales.  Skin:    Comments: Dressing intact.  Neurological:     Mental Status: He is alert.  Psychiatric:        Mood and Affect: Mood normal.        Behavior: Behavior normal.     Lab Results  Component Value Date   WBC 8.3 02/17/2023   HGB 9.3 (L) 02/17/2023   HCT 29.1 (L) 02/17/2023   PLT 236 02/17/2023   GLUCOSE 168 (H) 02/14/2023   CHOL 242 (H) 04/27/2022   TRIG 368 (H) 04/27/2022   HDL 37 (L) 04/27/2022   LDLCALC 138 (H) 04/27/2022   ALT 22 02/06/2023   AST 23 02/06/2023   NA 133 (L) 02/14/2023   K 3.6 02/14/2023   CL 100 02/14/2023   CREATININE 0.97 02/14/2023   BUN 14 02/14/2023   CO2 24 02/14/2023   TSH 2.520 04/27/2022   INR 2.3 02/22/2023   HGBA1C 13.4 (H) 01/24/2023   MICROALBUR 0.4 06/13/2020     Assessment & Plan:   Problem List Items Addressed This Visit       Cardiovascular and Mediastinum   HTN (hypertension)    Stable. Continue Lisinopril and Metoprolol.         Endocrine   Uncontrolled type 2 diabetes mellitus with hyperglycemia (HCC) - Primary    Improving. Continue Insulin and Metformin.         Other   Acute blood loss anemia    Improved. Will need labs at follow up.       Meds ordered this encounter  Medications   gabapentin (NEURONTIN) 300 MG capsule    Sig: Take 2 capsules (600 mg total) by mouth 2 (two) times  daily.    Dispense:  360 capsule    Refill:  3    Follow-up:  1 months  Natanel Snavely Adriana Simas DO [facility]

## 2023-02-27 NOTE — Assessment & Plan Note (Signed)
Improved. Will need labs at follow up.

## 2023-02-27 NOTE — Assessment & Plan Note (Signed)
Stable. Continue Lisinopril and Metoprolol.  

## 2023-02-28 ENCOUNTER — Other Ambulatory Visit: Payer: Self-pay | Admitting: Family Medicine

## 2023-02-28 ENCOUNTER — Encounter: Payer: Self-pay | Admitting: Family Medicine

## 2023-02-28 MED ORDER — FREESTYLE LIBRE 3 SENSOR MISC: 6 refills | Status: DC

## 2023-02-28 NOTE — Telephone Encounter (Signed)
Refill request for warfarin:  Last INR was 2.3 on 02/22/23 Next INR due 03/08/23 LOV was 11/09/22  Refill approved.

## 2023-03-04 ENCOUNTER — Emergency Department (HOSPITAL_COMMUNITY)
Admission: EM | Admit: 2023-03-04 | Discharge: 2023-03-04 | Disposition: A | Discharge status: Home / Self Care | Payer: Medicaid Other | Attending: Emergency Medicine | Admitting: Emergency Medicine

## 2023-03-04 DIAGNOSIS — Z7901 Long term (current) use of anticoagulants: Secondary | ICD-10-CM | POA: Diagnosis not present

## 2023-03-04 DIAGNOSIS — Y732 Prosthetic and other implants, materials and accessory gastroenterology and urology devices associated with adverse incidents: Secondary | ICD-10-CM | POA: Diagnosis not present

## 2023-03-04 DIAGNOSIS — Z794 Long term (current) use of insulin: Secondary | ICD-10-CM | POA: Insufficient documentation

## 2023-03-04 DIAGNOSIS — T82898A Other specified complication of vascular prosthetic devices, implants and grafts, initial encounter: Secondary | ICD-10-CM

## 2023-03-04 NOTE — ED Triage Notes (Signed)
Pt. Stated, I've had a clogged pic line since yesterday. Im taking antibiotic for infection in my leg

## 2023-03-04 NOTE — ED Provider Notes (Signed)
[place] EMERGENCY DEPARTMENT AT [facility] Provider Note   CSN: [id number] Arrival date & time: 03/04/23  7829     History  No chief complaint on file.   MIRL HILLERY is a 47 y.o. male.  Patient is a 47 year old male with multiple medical problems who is currently on daptomycin via PICC line due to a leg infection.  Patient is supposed to be on the IV antibiotic until 28 June.  He is here today because he states his PICC line is not flushing.  He has not been able to use it since Wednesday.  Home health came out today and also was unable to use it.  They reported for him to come here for evaluation.  He otherwise has no other complaints.  He gets the antibiotic daily.  The history is provided by the patient.       Home Medications Prior to Admission medications   Medication Sig Start Date End Date Taking? Authorizing Provider  Continuous Glucose Sensor (FREESTYLE LIBRE 3 SENSOR) MISC Place 1 sensor on the skin every 14 days. Use to check glucose continuously 02/28/23   [name], DO  daptomycin (CUBICIN) IVPB Inject 900 mg into the vein daily. Indication:  BKA site infection  First Dose: Yes Last Day of Therapy:  03/25/23  Labs - Once weekly:  CBC/D, BMP, and CPK Labs - Every other week:  ESR and CRP Method of administration: IV Push Method of administration may be changed at the discretion of home infusion pharmacist based upon assessment of the patient and/or caregiver's ability to self-administer the medication ordered. 02/01/23 03/25/23  Hongalgi, Maximino Greenland, MD  enoxaparin (LOVENOX) 100 MG/ML injection Inject 1 mL (100 mg total) into the skin 2 (two) times daily for 10 days. *Use with the 60mg /0.41mL syringe for a total of 160mg /dose* 02/03/23 02/13/23  Rhetta Mura, MD  ezetimibe (ZETIA) 10 MG tablet Take 1 tablet (10 mg total) by mouth daily. 10/12/22   [name], DO  gabapentin (NEURONTIN) 300 MG capsule Take 2 capsules (600 mg total) by mouth 2 (two)  times daily. 02/25/23   [name], DO  insulin glargine (LANTUS) 100 UNIT/ML Solostar Pen Inject 30 Units into the skin daily. 02/04/23   Rhetta Mura, MD  Insulin Pen Needle 32G X 4 MM MISC Use with Lantus flexpen 02/03/23   Rhetta Mura, MD  lisinopril (ZESTRIL) 20 MG tablet Take 1 tablet (20 mg total) by mouth daily. 10/12/22   [name], DO  metFORMIN (GLUCOPHAGE) 500 MG tablet Take 2 tablets (1,000 mg total) by mouth 2 (two) times daily with a meal. 10/12/22   Cook, Verdis Frederickson, DO  methocarbamol (ROBAXIN) 500 MG tablet Take 1 tablet (500 mg total) by mouth every 6 (six) hours as needed for muscle spasms. 02/03/23   Rhetta Mura, MD  metoprolol tartrate (LOPRESSOR) 25 MG tablet Take 1 tablet (25 mg total) by mouth 2 (two) times daily. 10/12/22   [name], DO  oxyCODONE (OXY IR/ROXICODONE) 5 MG immediate release tablet Take 1-2 tablets (5-10 mg total) by mouth every 4 (four) hours as needed for moderate pain (pain score 4-6). 02/03/23   Rhetta Mura, MD  pantoprazole (PROTONIX) 40 MG tablet Take 1 tablet (40 mg total) by mouth daily. 10/12/22   [name], DO  sertraline (ZOLOFT) 50 MG tablet Take 1 tablet (50 mg total) by mouth daily. 10/12/22   [name], DO  warfarin (COUMADIN) 10 MG tablet  TAKE 1 TABLET BY MOUTH DAILY EXCEPT TAKE  ONE-HALF TABLET  ON MONDAYS, WEDNESDAYS AND FRIDAYS OR AS DIRECTED 02/28/23   Jonelle Sidle, MD  Zinc Sulfate 220 (50 Zn) MG TABS Take 1 tablet (220 mg total) by mouth daily. 02/04/23   Rhetta Mura, MD      Allergies    Patient has no known allergies.    Review of Systems   Review of Systems  Physical Exam Updated Vital Signs BP 123/63   Pulse 82   Temp 98 F (36.7 C) (Oral)   Resp 16   SpO2 100%  Physical Exam HENT:     Head: Normocephalic.  Cardiovascular:     Rate and Rhythm: Normal rate.  Pulmonary:     Effort: Pulmonary effort is normal.  Musculoskeletal:     Comments: PICC line in the right  upper extremity clean, dry, no erythema  Skin:    General: Skin is warm.  Neurological:     Mental Status: He is alert. Mental status is at baseline.     ED Results / Procedures / Treatments   Labs (all labs ordered are listed, but only abnormal results are displayed) Labs Reviewed - No data to display  EKG None  Radiology No results found.  Procedures Procedures    Medications Ordered in ED Medications - No data to display  ED Course/ Medical Decision Making/ A&P                             Medical Decision Making  Patient here today because of concern that his PICC line was not working.  However when the nurse and I evaluated his PICC line nurse noticed that the clamp was on the extension port.  Once she took the clamp off she was able to easily flush the PICC line and draw back blood.  No indication for replacement needed.  This was all showed to the patient.  He has his daptomycin at home and reports he can give himself the antibiotic when he gets back home.  At this time no indication for further intervention.        Final Clinical Impression(s) / ED Diagnoses Final diagnoses:  Occluded PICC line, initial encounter [facility])    Rx / DC Orders ED Discharge Orders     None         Gwyneth Sprout, MD 03/04/23 1349

## 2023-03-04 NOTE — ED Notes (Signed)
PICC line was flushed with NS and flushed without issues. I was also able to pull back blood without issues. Pt denied any pain or discomfort. Most distal clamp was closed initially.

## 2023-03-04 NOTE — Discharge Instructions (Signed)
Continue to follow-up with your regularly scheduled appointments.  Make sure all the clamps are open on your PICC line and give yourself a dose of your antibiotic when you get home.

## 2023-03-07 ENCOUNTER — Ambulatory Visit (INDEPENDENT_AMBULATORY_CARE_PROVIDER_SITE_OTHER): Payer: Medicaid Other | Admitting: Orthopedic Surgery

## 2023-03-07 ENCOUNTER — Telehealth: Payer: Self-pay

## 2023-03-07 DIAGNOSIS — Z89512 Acquired absence of left leg below knee: Secondary | ICD-10-CM

## 2023-03-07 NOTE — Transitions of Care (Post Inpatient/ED Visit) (Signed)
03/07/2023  Name: Adam Long MRN: [id number] DOB: [birth year]  Today's TOC FU Call Status: Today's TOC FU Call Status:: Successful TOC FU Call Competed TOC FU Call Complete Date: 03/07/23  Transition Care Management Follow-up Telephone Call Date of Discharge: 03/04/23 Discharge Facility: [facility]) Type of Discharge: Emergency Department Primary Inpatient Discharge Diagnosis:: PICC line stopped up How have you been since you were released from the hospital?: Better Any questions or concerns?: No  Items Reviewed: Did you receive and understand the discharge instructions provided?: No Medications obtained,verified, and reconciled?: Yes (Medications Reviewed) Any new allergies since your discharge?: Yes Dietary orders reviewed?: Yes Do you have support at home?: Yes People in Home: spouse  Medications Reviewed Today: Medications Reviewed Today     Reviewed by Karena Addison, LPN (Licensed Practical Nurse) on 03/07/23 at 1602  Med List Status: <None>   Medication Order Taking? Sig Documenting Provider Last Dose Status Informant  Continuous Glucose Sensor (FREESTYLE LIBRE 3 SENSOR) MISC [id number]  Place 1 sensor on the skin every 14 days. Use to check glucose continuously [name], DO  Active   daptomycin (CUBICIN) IVPB [id number] No Inject 900 mg into the vein daily. Indication:  BKA site infection  First Dose: Yes Last Day of Therapy:  03/25/23  Labs - Once weekly:  CBC/D, BMP, and CPK Labs - Every other week:  ESR and CRP Method of administration: IV Push Method of administration may be changed at the discretion of home infusion pharmacist based upon assessment of the patient and/or caregiver's ability to self-administer the medication ordered. Elease Etienne, MD Taking Active Self  enoxaparin (LOVENOX) 100 MG/ML injection [id number] No Inject 1 mL (100 mg total) into the skin 2 (two) times daily for 10 days. *Use with the 60mg /0.8mL syringe for a total of  160mg /doseRhetta Mura, MD Taking Expired 02/13/23 2359 Self           Med Note [facility] Feb 07, 2023 12:26 PM) Patient states that he thinks this medication is on hold.  ezetimibe (ZETIA) 10 MG tablet [id number] No Take 1 tablet (10 mg total) by mouth daily. [name], DO Taking Active Self  gabapentin (NEURONTIN) 300 MG capsule [id number]  Take 2 capsules (600 mg total) by mouth 2 (two) times daily. Everlene Other G, DO  Active   insulin glargine (LANTUS) 100 UNIT/ML Solostar Pen [id number] No Inject 30 Units into the skin daily. Rhetta Mura, MD Taking Active Self  Insulin Pen Needle 32G X 4 MM MISC [id number] No Use with Lantus flexpen Rhetta Mura, MD Taking Active Self  lisinopril (ZESTRIL) 20 MG tablet [id number] No Take 1 tablet (20 mg total) by mouth daily. [name], DO Taking Active Self  metFORMIN (GLUCOPHAGE) 500 MG tablet [id number] No Take 2 tablets (1,000 mg total) by mouth 2 (two) times daily with a meal. [name], DO Taking Active Self  methocarbamol (ROBAXIN) 500 MG tablet [id number] No Take 1 tablet (500 mg total) by mouth every 6 (six) hours as needed for muscle spasms. Rhetta Mura, MD Taking Active Self  metoprolol tartrate (LOPRESSOR) 25 MG tablet [id number] No Take 1 tablet (25 mg total) by mouth 2 (two) times daily. [name], DO Taking Active Self  oxyCODONE (OXY IR/ROXICODONE) 5 MG immediate release tablet [id number] No Take 1-2 tablets (5-10 mg total) by mouth every 4 (four) hours as needed for moderate pain (pain score 4-6). Rhetta Mura, MD Taking Active Self  pantoprazole (PROTONIX) 40 MG tablet [id number] No Take 1 tablet (40 mg total) by mouth daily. [name], DO Taking Active Self  sertraline (ZOLOFT) 50 MG tablet [id number] No Take 1 tablet (50 mg total) by mouth daily. [name], DO Taking Active Self  warfarin (COUMADIN) 10 MG tablet [id number]  TAKE 1 TABLET BY MOUTH DAILY EXCEPT TAKE  ONE-HALF  TABLET  ON MONDAYS, WEDNESDAYS AND FRIDAYS OR AS DIRECTED Jonelle Sidle, MD  Active   Zinc Sulfate 220 (50 Zn) MG TABS [id number] No Take 1 tablet (220 mg total) by mouth daily. Rhetta Mura, MD Taking Active Self            Home Care and Equipment/Supplies: Were Home Health Services Ordered?: NA Has Agency set up a time to come to your home?: No Any new equipment or medical supplies ordered?: NA Were you able to get the equipment/medical supplies?: No Do you have any questions related to the use of the equipment/supplies?: No  Functional Questionnaire: Do you need assistance with bathing/showering or dressing?: No Do you need assistance with meal preparation?: No Do you need assistance with eating?: No Do you have difficulty maintaining continence: No Do you need assistance with getting out of bed/getting out of a chair/moving?: No Do you have difficulty managing or taking your medications?: No  Follow up appointments reviewed: PCP Follow-up appointment confirmed?: NA Specialist Hospital Follow-up appointment confirmed?: Yes Date of Specialist follow-up appointment?: 03/07/23 Follow-Up Specialty Provider:: Dr Lajoyce Corners Do you need transportation to your follow-up appointment?: No Do you understand care options if your condition(s) worsen?: Yes-patient verbalized understanding    SIGNATURE Karena Addison, LPN [facility] Nurse Health Advisor Direct Dial [phone]

## 2023-03-08 ENCOUNTER — Ambulatory Visit: Payer: Medicaid Other | Attending: Cardiology | Admitting: *Deleted

## 2023-03-08 DIAGNOSIS — Z952 Presence of prosthetic heart valve: Secondary | ICD-10-CM | POA: Diagnosis not present

## 2023-03-08 DIAGNOSIS — Z5181 Encounter for therapeutic drug level monitoring: Secondary | ICD-10-CM | POA: Diagnosis not present

## 2023-03-08 LAB — POCT INR: INR: 3.6 — AB (ref 2.0–3.0)

## 2023-03-08 NOTE — Patient Instructions (Signed)
Hold warfarin tonight then resume 1 tablet daily expcet for 1/2 tablet on Monday, Wednesday and Friday. Recheck in 2 wks.

## 2023-03-14 ENCOUNTER — Encounter: Payer: Self-pay | Admitting: Orthopedic Surgery

## 2023-03-14 NOTE — Progress Notes (Signed)
Office Visit Note   Patient: Adam Long           Date of Birth: 1976-09-18           MRN: [id number] Visit Date: 03/07/2023              Requested by: [name], DO [address] [place],  [place] [address] PCP: [name], DO  Chief Complaint  Patient presents with   Left Leg - Routine Post Op    01/29/2023 left BKA revision       HPI: Patient is a 47 year old gentleman who is 4 weeks status post left below-knee amputation revision.  Patient has been receiving antibiotics through a PICC line.  Antibiotics through June 28.  Assessment & Plan: Visit Diagnoses:  1. S/P BKA (below knee amputation), left (HCC)     Plan: Dial soap cleansing dry dressing changes with compression.  Packed the wound open.  Follow-Up Instructions: Return in about 4 weeks (around 04/04/2023).   Ortho Exam  Patient is alert, oriented, no adenopathy, well-dressed, normal affect, normal respiratory effort. Examination there is healthy granulation tissue the wound is 6 x 4 cm and 2 cm deep.  Imaging: No results found. No images are attached to the encounter.  Labs: Lab Results  Component Value Date   HGBA1C 13.4 (H) 01/24/2023   HGBA1C 8.6 (H) 04/27/2022   HGBA1C 6.4 (H) 10/30/2021   ESRSEDRATE 60 (H) 09/14/2021   ESRSEDRATE 65 (H) 09/07/2021   ESRSEDRATE 104 (H) 08/29/2021   CRP 1.5 (H) 09/14/2021   CRP 3.4 (H) 09/07/2021   CRP 12.8 (H) 08/29/2021   LABURIC 7.9 02/07/2023   REPTSTATUS 02/13/2023 FINAL 02/08/2023   GRAMSTAIN  01/26/2023    RARE WBC PRESENT, PREDOMINANTLY PMN FEW GRAM POSITIVE COCCI IN PAIRS FEW GRAM POSITIVE COCCI IN CLUSTERS    CULT  02/08/2023    NO GROWTH 5 DAYS Performed at [facility], [address], [place], [place] [address]    LABORGA METHICILLIN RESISTANT STAPHYLOCOCCUS AUREUS 01/26/2023     Lab Results  Component Value Date   ALBUMIN 2.4 (L) 02/06/2023   ALBUMIN 1.9 (L) 01/26/2023   ALBUMIN 1.9 (L) 01/24/2023   PREALBUMIN 21.6  10/29/2021    Lab Results  Component Value Date   MG 2.0 02/11/2023   MG 2.1 02/10/2023   MG 2.1 02/09/2023   Lab Results  Component Value Date   VD25OH 15.4 (L) 04/27/2022   VD25OH 38 06/13/2020    Lab Results  Component Value Date   PREALBUMIN 21.6 10/29/2021      Latest Ref Rng & Units 02/17/2023    2:49 AM 02/16/2023    3:36 AM 02/15/2023    3:18 AM  CBC EXTENDED  WBC 4.0 - 10.5 K/uL 8.3  6.5  7.5   RBC 4.22 - 5.81 MIL/uL 3.49  3.42  3.43   Hemoglobin 13.0 - 17.0 g/dL 9.3  9.3  9.1   HCT 82.9 - 52.0 % 29.1  29.0  28.8   Platelets 150 - 400 K/uL 236  222  226      There is no height or weight on file to calculate BMI.  Orders:  No orders of the defined types were placed in this encounter.  No orders of the defined types were placed in this encounter.    Procedures: No procedures performed  Clinical Data: No additional findings.  ROS:  All other systems negative, except as noted in  the HPI. Review of Systems  Objective: Vital Signs: There were no vitals taken for this visit.  Specialty Comments:  No specialty comments available.  PMFS History: Patient Active Problem List   Diagnosis Date Noted   Acute blood loss anemia 02/06/2023   GERD (gastroesophageal reflux disease) 01/24/2023   Cellulitis and abscess of left leg 01/23/2023   History of osteomyelitis 10/12/2022   Uncontrolled type 2 diabetes mellitus with hyperglycemia (HCC) 10/12/2022   CAD (coronary artery disease) 10/12/2022   Encounter for therapeutic drug monitoring 10/12/2022   S/P BKA (below knee amputation), left (HCC) 10/30/2021   Dehiscence of amputation stump of left lower extremity (HCC)    Anxiety 09/30/2020   Preventative health care 12/13/2019   S/P aortic valve replacement with mechanical valve 12/05/2019   Hyperlipidemia associated with type 2 diabetes mellitus (HCC) 10/12/2015   Class 3 obesity (HCC) 10/12/2015   HTN (hypertension) 07/02/2013   Past Medical History:   Diagnosis Date   Anxiety    Aortic stenosis    Cellulitis and abscess of lower extremity 06/11/2019   Cellulitis of fourth toe of left foot    Cholelithiasis    Coronary artery disease    Nonobstructive CAD (40-50% LAD) 08/2019   Depression    Diabetes mellitus without complication (HCC)    Phreesia 09/27/2020   Elevated troponin level not due myocardial infarction 11/11/2019   Essential hypertension    Gangrene of toe of left foot (HCC) 07/06/2019   Heart murmur    Phreesia 09/27/2020   Hyperlipidemia    Phreesia 09/27/2020   Hypertension    Phreesia 09/27/2020   Mixed hyperlipidemia    Morbid obesity (HCC)    S/P aortic valve replacement with mechanical valve 12/05/2019   25 mm Carbomedics top hat bileaflet mechanical valve via partial upper hemi-sternotomy   Severe aortic stenosis 09/24/2019   Type 2 diabetes mellitus (HCC)     Family History  Problem Relation Age of Onset   Cancer Mother        Brain tumor   Heart disease Father    Hyperlipidemia Father    Hypertension Father    Stroke Father    Heart murmur Sister    Heart attack Maternal Grandmother    Liver disease Maternal Grandfather    Breast cancer Paternal Grandmother    COPD Paternal Grandfather    Heart attack Maternal Aunt    Colon cancer Maternal Uncle 67   Pancreatic cancer Paternal Aunt    Heart disease Paternal Uncle    Prostate cancer Neg Hx     Past Surgical History:  Procedure Laterality Date   ABDOMINAL AORTOGRAM W/LOWER EXTREMITY N/A 07/06/2019   Procedure: ABDOMINAL AORTOGRAM W/LOWER EXTREMITY;  Surgeon: Sherren Kerns, MD;  Location: [facility] INVASIVE CV LAB;  Service: Cardiovascular;  Laterality: N/A;  Bilateral   AMPUTATION Left 07/09/2019   Procedure: LEFT FOURTH and Fifth TOE AMPUTATION.;  Surgeon: Larina Earthly, MD;  Location: [facility] OR;  Service: Vascular;  Laterality: Left;   AMPUTATION Left 03/11/2021   Procedure: LEFT FOOT 5TH  AND 4TH RAY AMPUTATION;  Surgeon: Nadara Mustard, MD;   Location: [facility] OR;  Service: Orthopedics;  Laterality: Left;   AMPUTATION Left 08/28/2021   Procedure: AMPUTATION BELOW KNEE;  Surgeon: Nadara Mustard, MD;  Location: [facility] OR;  Service: Orthopedics;  Laterality: Left;   AMPUTATION Left 01/29/2023   Procedure: LEFT AMPUTATION BELOW KNEE REVISION AND CLOSURE;  Surgeon: Nadara Mustard, MD;  Location: [facility] OR;  Service: Orthopedics;  Laterality: Left;   AORTIC VALVE REPLACEMENT N/A 12/05/2019   Procedure: PARTIAL STERNOTOMY FOR AORTIC VALVE REPLACEMENT (AVR), USING CARBOMEDICS SUPRA-ANNULAR TOP HAT ;  Surgeon: Purcell Nails, MD;  Location: [facility] OR;  Service: Open Heart Surgery;  Laterality: N/A;  No neck lines on left   APPLICATION OF WOUND VAC Left 10/30/2021   Procedure: APPLICATION OF WOUND VAC;  Surgeon: Nadara Mustard, MD;  Location: [facility] OR;  Service: Orthopedics;  Laterality: Left;   APPLICATION OF WOUND VAC Left 12/25/2021   Procedure: APPLICATION OF WOUND VAC;  Surgeon: Nadara Mustard, MD;  Location: [facility] OR;  Service: Orthopedics;  Laterality: Left;   APPLICATION OF WOUND VAC Left 01/26/2023   Procedure: APPLICATION OF WOUND VAC TO LEFT STUMP;  Surgeon: Tarry Kos, MD;  Location: [facility] OR;  Service: Orthopedics;  Laterality: Left;   CARDIAC VALVE REPLACEMENT N/A    Phreesia 09/27/2020   I & D EXTREMITY Left 01/26/2023   Procedure: IRRIGATION AND DEBRIDEMENT OF LEFT BKA STUMP;  Surgeon: Tarry Kos, MD;  Location: [facility] OR;  Service: Orthopedics;  Laterality: Left;   IR RADIOLOGY PERIPHERAL GUIDED IV START  10/05/2019   IR US GUIDE VASC ACCESS RIGHT  10/05/2019   MULTIPLE EXTRACTIONS WITH ALVEOLOPLASTY N/A 10/26/2019   Procedure: EXTRACTION OF TOOTH #'S 3, 5-11,19-28,  AND 32 WITH ALVEOLOPLASTY;  Surgeon: Charlynne Pander, DDS;  Location: [facility] OR;  Service: Oral Surgery;  Laterality: N/A;   RIGHT HEART CATH AND CORONARY ANGIOGRAPHY N/A 09/24/2019   Procedure: RIGHT HEART CATH AND CORONARY ANGIOGRAPHY;  Surgeon: Tonny Bollman, MD;  Location: [facility] INVASIVE CV  LAB;  Service: Cardiovascular;  Laterality: N/A;   STUMP REVISION Left 10/30/2021   Procedure: REVISION LEFT BELOW KNEE AMPUTATION;  Surgeon: Nadara Mustard, MD;  Location: [facility] OR;  Service: Orthopedics;  Laterality: Left;   STUMP REVISION Left 12/25/2021   Procedure: REVISION LEFT BELOW KNEE AMPUTATION;  Surgeon: Nadara Mustard, MD;  Location: [facility] OR;  Service: Orthopedics;  Laterality: Left;   TEE WITHOUT CARDIOVERSION N/A 12/05/2019   Procedure: TRANSESOPHAGEAL ECHOCARDIOGRAM (TEE);  Surgeon: Purcell Nails, MD;  Location: [facility] OR;  Service: Open Heart Surgery;  Laterality: N/A;   TEE WITHOUT CARDIOVERSION N/A 09/01/2021   Procedure: TRANSESOPHAGEAL ECHOCARDIOGRAM (TEE);  Surgeon: Little Ishikawa, MD;  Location: [facility] ENDOSCOPY;  Service: Cardiovascular;  Laterality: N/A;   Social History   Occupational History    Comment: Glass blower/designer- self-employed  Tobacco Use   Smoking status: Never   Smokeless tobacco: Former    Types: Chew    Quit date: 2021  Vaping Use   Vaping Use: Never used  Substance and Sexual Activity   Alcohol use: Yes    Alcohol/week: 4.0 standard drinks of alcohol    Types: 4 Cans of beer per week    Comment: occasionally   Drug use: No   Sexual activity: Yes    Birth control/protection: None

## 2023-03-21 ENCOUNTER — Other Ambulatory Visit (HOSPITAL_COMMUNITY): Payer: Self-pay

## 2023-03-21 ENCOUNTER — Other Ambulatory Visit: Payer: Self-pay

## 2023-03-23 ENCOUNTER — Ambulatory Visit: Payer: Medicaid Other | Attending: Cardiology | Admitting: *Deleted

## 2023-03-23 DIAGNOSIS — Z5181 Encounter for therapeutic drug level monitoring: Secondary | ICD-10-CM

## 2023-03-23 DIAGNOSIS — Z952 Presence of prosthetic heart valve: Secondary | ICD-10-CM

## 2023-03-23 LAB — POCT INR: INR: 2 (ref 2.0–3.0)

## 2023-03-23 NOTE — Patient Instructions (Signed)
Continue warfarin 1 tablet daily expcet for 1/2 tablet on Monday, Wednesday and Friday. Recheck in 4 wks.

## 2023-03-24 ENCOUNTER — Ambulatory Visit (INDEPENDENT_AMBULATORY_CARE_PROVIDER_SITE_OTHER): Payer: Medicaid Other | Admitting: Family Medicine

## 2023-03-24 VITALS — BP 112/68 | HR 85 | Temp 98.2°F

## 2023-03-24 DIAGNOSIS — E1169 Type 2 diabetes mellitus with other specified complication: Secondary | ICD-10-CM | POA: Diagnosis not present

## 2023-03-24 DIAGNOSIS — I10 Essential (primary) hypertension: Secondary | ICD-10-CM

## 2023-03-24 DIAGNOSIS — K760 Fatty (change of) liver, not elsewhere classified: Secondary | ICD-10-CM

## 2023-03-24 DIAGNOSIS — Z794 Long term (current) use of insulin: Secondary | ICD-10-CM

## 2023-03-24 DIAGNOSIS — D62 Acute posthemorrhagic anemia: Secondary | ICD-10-CM

## 2023-03-24 DIAGNOSIS — E1165 Type 2 diabetes mellitus with hyperglycemia: Secondary | ICD-10-CM

## 2023-03-24 DIAGNOSIS — E785 Hyperlipidemia, unspecified: Secondary | ICD-10-CM

## 2023-03-24 NOTE — Patient Instructions (Signed)
Increase lantus to 32 units.  Labs today.  Follow up in 3 months.

## 2023-03-25 DIAGNOSIS — K760 Fatty (change of) liver, not elsewhere classified: Secondary | ICD-10-CM | POA: Insufficient documentation

## 2023-03-25 NOTE — Progress Notes (Signed)
Subjective:  Patient ID: Adam Long, male    DOB: 1976-09-16  Age: 47 y.o. MRN: [id number]  CC: Chief Complaint  Patient presents with   Follow-up    PT states he has been having panic attacks approx 2 since his last visit last one being Friday 6/21   Diabetes    HPI:  47 year old male with an extensive past medical history including hypertension, coronary disease per CT scan, hepatic steatosis, morbid obesity, uncontrolled type 2 diabetes, hyperlipidemia, status post BKA (left), status post aortic valve replacement with mechanical valve presents for follow-up.  Patient reports that his blood sugars have been improving significantly.  States that his average blood sugar is 140's according to his continuous glucose monitor.  Went with metformin and Lantus 30 units.  Blood pressure well-controlled currently on lisinopril and metoprolol.   Last lipid panel revealed lipids not at goal.  Needs current lipid panel.  Patient is on Zetia.  He is not on statin.  He is high risk and would benefit from statin therapy if he can tolerate.  Patient Active Problem List   Diagnosis Date Noted   Hepatic steatosis 03/25/2023   Acute blood loss anemia 02/06/2023   GERD (gastroesophageal reflux disease) 01/24/2023   History of osteomyelitis 10/12/2022   Uncontrolled type 2 diabetes mellitus with hyperglycemia (HCC) 10/12/2022   CAD (coronary artery disease) 10/12/2022   Encounter for therapeutic drug monitoring 10/12/2022   S/P BKA (below knee amputation), left (HCC) 10/30/2021   Dehiscence of amputation stump of left lower extremity (HCC)    Anxiety 09/30/2020   Preventative health care 12/13/2019   S/P aortic valve replacement with mechanical valve 12/05/2019   Hyperlipidemia associated with type 2 diabetes mellitus (HCC) 10/12/2015   Class 3 obesity (HCC) 10/12/2015   HTN (hypertension) 07/02/2013    Social Hx   Social History   Socioeconomic History   Marital status: Single     Spouse name: Not on file   Number of children: Not on file   Years of education: Not on file   Highest education level: 12th grade  Occupational History    Comment: Glass blower/designer- self-employed  Tobacco Use   Smoking status: Never   Smokeless tobacco: Former    Types: Chew    Quit date: 2021  Vaping Use   Vaping Use: Never used  Substance and Sexual Activity   Alcohol use: Yes    Alcohol/week: 4.0 standard drinks of alcohol    Types: 4 Cans of beer per week    Comment: occasionally   Drug use: No   Sexual activity: Yes    Birth control/protection: None  Other Topics Concern   Not on file  Social History Narrative   Lives with his friend.    Social Determinants of Health   Financial Resource Strain: Medium Risk (02/21/2023)   Overall Financial Resource Strain (CARDIA)    Difficulty of Paying Living Expenses: Somewhat hard  Food Insecurity: No Food Insecurity (02/21/2023)   Hunger Vital Sign    Worried About Running Out of Food in the Last Year: Never true    Ran Out of Food in the Last Year: Never true  Transportation Needs: No Transportation Needs (02/21/2023)   PRAPARE - Administrator, Civil Service (Medical): No    Lack of Transportation (Non-Medical): No  Physical Activity: Insufficiently Active (02/21/2023)   Exercise Vital Sign    Days of Exercise per Week: 1 day    Minutes of Exercise  per Session: 10 min  Stress: Stress Concern Present (02/21/2023)   [employer] of Occupational Health - Occupational Stress Questionnaire    Feeling of Stress : Rather much  Social Connections: Socially Isolated (02/21/2023)   Social Connection and Isolation Panel [NHANES]    Frequency of Communication with Friends and Family: More than three times a week    Frequency of Social Gatherings with Friends and Family: Twice a week    Attends Religious Services: Never    Database administrator or Organizations: No    Attends Engineer, structural: Not on file     Marital Status: Never married    Review of Systems  Constitutional: Negative.   Cardiovascular: Negative.    Objective:  BP 112/68   Pulse 85   Temp 98.2 F (36.8 C)   SpO2 98%      03/24/2023    9:14 AM 03/04/2023    1:45 PM 03/04/2023   10:13 AM  BP/Weight  Systolic BP 112 123 133  Diastolic BP 68 63 72    Physical Exam Vitals and nursing note reviewed.  Constitutional:      General: He is not in acute distress.    Appearance: He is obese.  HENT:     Head: Normocephalic and atraumatic.  Cardiovascular:     Rate and Rhythm: Normal rate and regular rhythm.  Pulmonary:     Effort: Pulmonary effort is normal.     Breath sounds: Normal breath sounds. No wheezing, rhonchi or rales.  Neurological:     Mental Status: He is alert.  Psychiatric:        Mood and Affect: Mood normal.        Behavior: Behavior normal.     Lab Results  Component Value Date   WBC 8.3 02/17/2023   HGB 9.3 (L) 02/17/2023   HCT 29.1 (L) 02/17/2023   PLT 236 02/17/2023   GLUCOSE 168 (H) 02/14/2023   CHOL 242 (H) 04/27/2022   TRIG 368 (H) 04/27/2022   HDL 37 (L) 04/27/2022   LDLCALC 138 (H) 04/27/2022   ALT 22 02/06/2023   AST 23 02/06/2023   NA 133 (L) 02/14/2023   K 3.6 02/14/2023   CL 100 02/14/2023   CREATININE 0.97 02/14/2023   BUN 14 02/14/2023   CO2 24 02/14/2023   TSH 2.520 04/27/2022   INR 2.0 03/23/2023   HGBA1C 13.4 (H) 01/24/2023   MICROALBUR 0.4 06/13/2020     Assessment & Plan:   Problem List Items Addressed This Visit       Cardiovascular and Mediastinum   HTN (hypertension)    Stable.  Continue lisinopril metoprolol.  Also        Digestive   Hepatic steatosis     Endocrine   Uncontrolled type 2 diabetes mellitus with hyperglycemia (HCC) - Primary    Glycemic control improving.  Increasing Lantus to 32 units daily.  Continue metformin.      Relevant Orders   CMP14+EGFR   Microalbumin / creatinine urine ratio   Hyperlipidemia associated with type  2 diabetes mellitus (HCC)    Lipid panel to assess.  Would benefit from statin therapy.      Relevant Orders   Lipid panel     Other   Acute blood loss anemia   Relevant Orders   CBC   Iron, TIBC and Ferritin Panel   Follow-up:  Return in about 3 months (around 06/24/2023).  Everlene Other DO [facility]

## 2023-03-25 NOTE — Assessment & Plan Note (Signed)
Glycemic control improving.  Increasing Lantus to 32 units daily.  Continue metformin.

## 2023-03-25 NOTE — Assessment & Plan Note (Signed)
Stable.  Continue lisinopril metoprolol.  Also

## 2023-03-25 NOTE — Assessment & Plan Note (Signed)
Lipid panel to assess.  Would benefit from statin therapy.

## 2023-04-04 ENCOUNTER — Ambulatory Visit (INDEPENDENT_AMBULATORY_CARE_PROVIDER_SITE_OTHER): Payer: Medicaid Other | Admitting: Orthopedic Surgery

## 2023-04-04 DIAGNOSIS — Z89512 Acquired absence of left leg below knee: Secondary | ICD-10-CM

## 2023-04-04 DIAGNOSIS — T8781 Dehiscence of amputation stump: Secondary | ICD-10-CM

## 2023-04-05 ENCOUNTER — Encounter: Payer: Self-pay | Admitting: Orthopedic Surgery

## 2023-04-05 NOTE — Progress Notes (Signed)
Office Visit Note   Patient: Adam Long           Date of Birth: 1976/07/09           MRN: [id number] Visit Date: 04/04/2023              Requested by: [name], DO [address] [place],  [place] [address] PCP: [name], DO  Chief Complaint  Patient presents with   Left Leg - Routine Post Op    01/29/2023 left BKA revision       HPI: Patient is a 47 year old gentleman who is 2 months status post revision left below-knee amputation wound packed open with gauze.  Assessment & Plan: Visit Diagnoses:  1. Dehiscence of amputation stump of left lower extremity (HCC)   2. S/P BKA (below knee amputation), left (HCC)     Plan: Recommended continued wound debridement packing the wound open with 4 x 4 gauze.  Sutures harvested today.  At follow-up anticipate we could apply some tissue graft in the office if we have healthy granulation tissue.  Follow-Up Instructions: Return in about 4 weeks (around 05/02/2023).   Ortho Exam  Patient is alert, oriented, no adenopathy, well-dressed, normal affect, normal respiratory effort. Examination the wound bed is filled with fibrinous exudative tissue.  This was debrided back to healthy viable granulation tissue.  The wound is 1 x 6 cm and 2 cm deep.  There is no exposed bone.  Recommended continue wound care and potential tissue graft at follow-up.  IV antibiotics stopped on June 28.  Imaging: No results found.   Labs: Lab Results  Component Value Date   HGBA1C 13.4 (H) 01/24/2023   HGBA1C 8.6 (H) 04/27/2022   HGBA1C 6.4 (H) 10/30/2021   ESRSEDRATE 60 (H) 09/14/2021   ESRSEDRATE 65 (H) 09/07/2021   ESRSEDRATE 104 (H) 08/29/2021   CRP 1.5 (H) 09/14/2021   CRP 3.4 (H) 09/07/2021   CRP 12.8 (H) 08/29/2021   LABURIC 7.9 02/07/2023   REPTSTATUS 02/13/2023 FINAL 02/08/2023   GRAMSTAIN  01/26/2023    RARE WBC PRESENT, PREDOMINANTLY PMN FEW GRAM POSITIVE COCCI IN PAIRS FEW GRAM POSITIVE COCCI IN CLUSTERS    CULT   02/08/2023    NO GROWTH 5 DAYS Performed at [facility], [address], [place], [place] [address]    LABORGA METHICILLIN RESISTANT STAPHYLOCOCCUS AUREUS 01/26/2023     Lab Results  Component Value Date   ALBUMIN 2.4 (L) 02/06/2023   ALBUMIN 1.9 (L) 01/26/2023   ALBUMIN 1.9 (L) 01/24/2023   PREALBUMIN 21.6 10/29/2021    Lab Results  Component Value Date   MG 2.0 02/11/2023   MG 2.1 02/10/2023   MG 2.1 02/09/2023   Lab Results  Component Value Date   VD25OH 15.4 (L) 04/27/2022   VD25OH 38 06/13/2020    Lab Results  Component Value Date   PREALBUMIN 21.6 10/29/2021      Latest Ref Rng & Units 02/17/2023    2:49 AM 02/16/2023    3:36 AM 02/15/2023    3:18 AM  CBC EXTENDED  WBC 4.0 - 10.5 K/uL 8.3  6.5  7.5   RBC 4.22 - 5.81 MIL/uL 3.49  3.42  3.43   Hemoglobin 13.0 - 17.0 g/dL 9.3  9.3  9.1   HCT 82.9 - 52.0 % 29.1  29.0  28.8   Platelets 150 - 400 K/uL 236  222  226      There is no  height or weight on file to calculate BMI.  Orders:  No orders of the defined types were placed in this encounter.  No orders of the defined types were placed in this encounter.    Procedures: No procedures performed  Clinical Data: No additional findings.  ROS:  All other systems negative, except as noted in the HPI. Review of Systems  Objective: Vital Signs: There were no vitals taken for this visit.  Specialty Comments:  No specialty comments available.  PMFS History: Patient Active Problem List   Diagnosis Date Noted   Hepatic steatosis 03/25/2023   Acute blood loss anemia 02/06/2023   GERD (gastroesophageal reflux disease) 01/24/2023   History of osteomyelitis 10/12/2022   Uncontrolled type 2 diabetes mellitus with hyperglycemia (HCC) 10/12/2022   CAD (coronary artery disease) 10/12/2022   Encounter for therapeutic drug monitoring 10/12/2022   S/P BKA (below knee amputation), left (HCC) 10/30/2021   Dehiscence of amputation stump of left lower  extremity (HCC)    Anxiety 09/30/2020   Preventative health care 12/13/2019   S/P aortic valve replacement with mechanical valve 12/05/2019   Hyperlipidemia associated with type 2 diabetes mellitus (HCC) 10/12/2015   Class 3 obesity (HCC) 10/12/2015   HTN (hypertension) 07/02/2013   Past Medical History:  Diagnosis Date   Anxiety    Aortic stenosis    Cellulitis and abscess of lower extremity 06/11/2019   Cellulitis of fourth toe of left foot    Cholelithiasis    Coronary artery disease    Nonobstructive CAD (40-50% LAD) 08/2019   Depression    Diabetes mellitus without complication (HCC)    Phreesia 09/27/2020   Elevated troponin level not due myocardial infarction 11/11/2019   Essential hypertension    Gangrene of toe of left foot (HCC) 07/06/2019   Heart murmur    Phreesia 09/27/2020   Hyperlipidemia    Phreesia 09/27/2020   Hypertension    Phreesia 09/27/2020   Mixed hyperlipidemia    Morbid obesity (HCC)    S/P aortic valve replacement with mechanical valve 12/05/2019   25 mm Carbomedics top hat bileaflet mechanical valve via partial upper hemi-sternotomy   Severe aortic stenosis 09/24/2019   Type 2 diabetes mellitus (HCC)     Family History  Problem Relation Age of Onset   Cancer Mother        Brain tumor   Heart disease Father    Hyperlipidemia Father    Hypertension Father    Stroke Father    Heart murmur Sister    Heart attack Maternal Grandmother    Liver disease Maternal Grandfather    Breast cancer Paternal Grandmother    COPD Paternal Grandfather    Heart attack Maternal Aunt    Colon cancer Maternal Uncle 67   Pancreatic cancer Paternal Aunt    Heart disease Paternal Uncle    Prostate cancer Neg Hx     Past Surgical History:  Procedure Laterality Date   ABDOMINAL AORTOGRAM W/LOWER EXTREMITY N/A 07/06/2019   Procedure: ABDOMINAL AORTOGRAM W/LOWER EXTREMITY;  Surgeon: Sherren Kerns, MD;  Location: [facility];  Service: Cardiovascular;   Laterality: N/A;  Bilateral   AMPUTATION Left 07/09/2019   Procedure: LEFT FOURTH and Fifth TOE AMPUTATION.;  Surgeon: Larina Earthly, MD;  Location: [facility] OR;  Service: Vascular;  Laterality: Left;   AMPUTATION Left 03/11/2021   Procedure: LEFT FOOT 5TH  AND 4TH RAY AMPUTATION;  Surgeon: Nadara Mustard, MD;  Location: [facility] OR;  Service: Orthopedics;  Laterality:  Left;   AMPUTATION Left 08/28/2021   Procedure: AMPUTATION BELOW KNEE;  Surgeon: Nadara Mustard, MD;  Location: [facility] OR;  Service: Orthopedics;  Laterality: Left;   AMPUTATION Left 01/29/2023   Procedure: LEFT AMPUTATION BELOW KNEE REVISION AND CLOSURE;  Surgeon: Nadara Mustard, MD;  Location: [facility] OR;  Service: Orthopedics;  Laterality: Left;   AORTIC VALVE REPLACEMENT N/A 12/05/2019   Procedure: PARTIAL STERNOTOMY FOR AORTIC VALVE REPLACEMENT (AVR), USING CARBOMEDICS SUPRA-ANNULAR TOP HAT ;  Surgeon: Purcell Nails, MD;  Location: [facility] OR;  Service: Open Heart Surgery;  Laterality: N/A;  No neck lines on left   APPLICATION OF WOUND VAC Left 10/30/2021   Procedure: APPLICATION OF WOUND VAC;  Surgeon: Nadara Mustard, MD;  Location: [facility] OR;  Service: Orthopedics;  Laterality: Left;   APPLICATION OF WOUND VAC Left 12/25/2021   Procedure: APPLICATION OF WOUND VAC;  Surgeon: Nadara Mustard, MD;  Location: [facility] OR;  Service: Orthopedics;  Laterality: Left;   APPLICATION OF WOUND VAC Left 01/26/2023   Procedure: APPLICATION OF WOUND VAC TO LEFT STUMP;  Surgeon: Tarry Kos, MD;  Location: [facility] OR;  Service: Orthopedics;  Laterality: Left;   CARDIAC VALVE REPLACEMENT N/A    Phreesia 09/27/2020   I & D EXTREMITY Left 01/26/2023   Procedure: IRRIGATION AND DEBRIDEMENT OF LEFT BKA STUMP;  Surgeon: Tarry Kos, MD;  Location: [facility] OR;  Service: Orthopedics;  Laterality: Left;   IR RADIOLOGY PERIPHERAL GUIDED IV START  10/05/2019   IR US GUIDE VASC ACCESS RIGHT  10/05/2019   MULTIPLE EXTRACTIONS WITH ALVEOLOPLASTY N/A 10/26/2019   Procedure: EXTRACTION OF TOOTH #'S 3,  5-11,19-28,  AND 32 WITH ALVEOLOPLASTY;  Surgeon: Charlynne Pander, DDS;  Location: [facility] OR;  Service: Oral Surgery;  Laterality: N/A;   RIGHT HEART CATH AND CORONARY ANGIOGRAPHY N/A 09/24/2019   Procedure: RIGHT HEART CATH AND CORONARY ANGIOGRAPHY;  Surgeon: Tonny Bollman, MD;  Location: [facility] INVASIVE CV LAB;  Service: Cardiovascular;  Laterality: N/A;   STUMP REVISION Left 10/30/2021   Procedure: REVISION LEFT BELOW KNEE AMPUTATION;  Surgeon: Nadara Mustard, MD;  Location: [facility] OR;  Service: Orthopedics;  Laterality: Left;   STUMP REVISION Left 12/25/2021   Procedure: REVISION LEFT BELOW KNEE AMPUTATION;  Surgeon: Nadara Mustard, MD;  Location: [facility] OR;  Service: Orthopedics;  Laterality: Left;   TEE WITHOUT CARDIOVERSION N/A 12/05/2019   Procedure: TRANSESOPHAGEAL ECHOCARDIOGRAM (TEE);  Surgeon: Purcell Nails, MD;  Location: [facility] OR;  Service: Open Heart Surgery;  Laterality: N/A;   TEE WITHOUT CARDIOVERSION N/A 09/01/2021   Procedure: TRANSESOPHAGEAL ECHOCARDIOGRAM (TEE);  Surgeon: Little Ishikawa, MD;  Location: [facility] ENDOSCOPY;  Service: Cardiovascular;  Laterality: N/A;   Social History   Occupational History    Comment: Glass blower/designer- self-employed  Tobacco Use   Smoking status: Never   Smokeless tobacco: Former    Types: Chew    Quit date: 2021  Vaping Use   Vaping Use: Never used  Substance and Sexual Activity   Alcohol use: Yes    Alcohol/week: 4.0 standard drinks of alcohol    Types: 4 Cans of beer per week    Comment: occasionally   Drug use: No   Sexual activity: Yes    Birth control/protection: None

## 2023-04-07 ENCOUNTER — Ambulatory Visit: Payer: Medicaid Other | Admitting: Internal Medicine

## 2023-04-18 ENCOUNTER — Encounter: Payer: Self-pay | Admitting: Orthopedic Surgery

## 2023-04-18 ENCOUNTER — Ambulatory Visit (INDEPENDENT_AMBULATORY_CARE_PROVIDER_SITE_OTHER): Payer: Medicaid Other | Admitting: Orthopedic Surgery

## 2023-04-18 DIAGNOSIS — Z89512 Acquired absence of left leg below knee: Secondary | ICD-10-CM

## 2023-04-18 NOTE — Progress Notes (Signed)
Office Visit Note   Patient: Adam Long           Date of Birth: October 25, 1975           MRN: [id number] Visit Date: 04/18/2023              Requested by: [name], DO [address] [place],  [place] [address] PCP: [name], DO  Chief Complaint  Patient presents with   Left Leg - Routine Post Op    01/29/2023 left BKA revision      HPI: Patient is a 47 year old gentleman who is over 2 months status post left below-knee amputation revision.  Assessment & Plan: Visit Diagnoses:  1. S/P BKA (below knee amputation), left (HCC)     Plan: The wound is healing slowly continue to wash with soap and water packed open with 4 x 4 gauze.  Follow-Up Instructions: Return in about 4 weeks (around 05/16/2023).   Ortho Exam  Patient is alert, oriented, no adenopathy, well-dressed, normal affect, normal respiratory effort. Examination the wound has healthy granulation tissue measures 1 x 4 cm and 1 cm deep.  There is no cellulitis no drainage no tenderness to palpation.  Imaging: No results found.   Labs: Lab Results  Component Value Date   HGBA1C 13.4 (H) 01/24/2023   HGBA1C 8.6 (H) 04/27/2022   HGBA1C 6.4 (H) 10/30/2021   ESRSEDRATE 60 (H) 09/14/2021   ESRSEDRATE 65 (H) 09/07/2021   ESRSEDRATE 104 (H) 08/29/2021   CRP 1.5 (H) 09/14/2021   CRP 3.4 (H) 09/07/2021   CRP 12.8 (H) 08/29/2021   LABURIC 7.9 02/07/2023   REPTSTATUS 02/13/2023 FINAL 02/08/2023   GRAMSTAIN  01/26/2023    RARE WBC PRESENT, PREDOMINANTLY PMN FEW GRAM POSITIVE COCCI IN PAIRS FEW GRAM POSITIVE COCCI IN CLUSTERS    CULT  02/08/2023    NO GROWTH 5 DAYS Performed at [facility], [address] [place], [place] [address]    LABORGA METHICILLIN RESISTANT STAPHYLOCOCCUS AUREUS 01/26/2023     Lab Results  Component Value Date   ALBUMIN 2.4 (L) 02/06/2023   ALBUMIN 1.9 (L) 01/26/2023   ALBUMIN 1.9 (L) 01/24/2023   PREALBUMIN 21.6 10/29/2021    Lab Results  Component Value  Date   MG 2.0 02/11/2023   MG 2.1 02/10/2023   MG 2.1 02/09/2023   Lab Results  Component Value Date   VD25OH 15.4 (L) 04/27/2022   VD25OH 38 06/13/2020    Lab Results  Component Value Date   PREALBUMIN 21.6 10/29/2021      Latest Ref Rng & Units 02/17/2023    2:49 AM 02/16/2023    3:36 AM 02/15/2023    3:18 AM  CBC EXTENDED  WBC 4.0 - 10.5 K/uL 8.3  6.5  7.5   RBC 4.22 - 5.81 MIL/uL 3.49  3.42  3.43   Hemoglobin 13.0 - 17.0 g/dL 9.3  9.3  9.1   HCT 29.5 - 52.0 % 29.1  29.0  28.8   Platelets 150 - 400 K/uL 236  222  226      There is no height or weight on file to calculate BMI.  Orders:  No orders of the defined types were placed in this encounter.  No orders of the defined types were placed in this encounter.    Procedures: No procedures performed  Clinical Data: No additional findings.  ROS:  All other systems negative, except as noted in the HPI. Review of Systems  Objective: Vital Signs: There were no vitals taken for this visit.  Specialty Comments:  No specialty comments available.  PMFS History: Patient Active Problem List   Diagnosis Date Noted   Hepatic steatosis 03/25/2023   Acute blood loss anemia 02/06/2023   GERD (gastroesophageal reflux disease) 01/24/2023   History of osteomyelitis 10/12/2022   Uncontrolled type 2 diabetes mellitus with hyperglycemia (HCC) 10/12/2022   CAD (coronary artery disease) 10/12/2022   Encounter for therapeutic drug monitoring 10/12/2022   S/P BKA (below knee amputation), left (HCC) 10/30/2021   Dehiscence of amputation stump of left lower extremity (HCC)    Anxiety 09/30/2020   Preventative health care 12/13/2019   S/P aortic valve replacement with mechanical valve 12/05/2019   Hyperlipidemia associated with type 2 diabetes mellitus (HCC) 10/12/2015   Class 3 obesity (HCC) 10/12/2015   HTN (hypertension) 07/02/2013   Past Medical History:  Diagnosis Date   Anxiety    Aortic stenosis    Cellulitis and  abscess of lower extremity 06/11/2019   Cellulitis of fourth toe of left foot    Cholelithiasis    Coronary artery disease    Nonobstructive CAD (40-50% LAD) 08/2019   Depression    Diabetes mellitus without complication (HCC)    Phreesia 09/27/2020   Elevated troponin level not due myocardial infarction 11/11/2019   Essential hypertension    Gangrene of toe of left foot (HCC) 07/06/2019   Heart murmur    Phreesia 09/27/2020   Hyperlipidemia    Phreesia 09/27/2020   Hypertension    Phreesia 09/27/2020   Mixed hyperlipidemia    Morbid obesity (HCC)    S/P aortic valve replacement with mechanical valve 12/05/2019   25 mm Carbomedics top hat bileaflet mechanical valve via partial upper hemi-sternotomy   Severe aortic stenosis 09/24/2019   Type 2 diabetes mellitus (HCC)     Family History  Problem Relation Age of Onset   Cancer Mother        Brain tumor   Heart disease Father    Hyperlipidemia Father    Hypertension Father    Stroke Father    Heart murmur Sister    Heart attack Maternal Grandmother    Liver disease Maternal Grandfather    Breast cancer Paternal Grandmother    COPD Paternal Grandfather    Heart attack Maternal Aunt    Colon cancer Maternal Uncle 67   Pancreatic cancer Paternal Aunt    Heart disease Paternal Uncle    Prostate cancer Neg Hx     Past Surgical History:  Procedure Laterality Date   ABDOMINAL AORTOGRAM W/LOWER EXTREMITY N/A 07/06/2019   Procedure: ABDOMINAL AORTOGRAM W/LOWER EXTREMITY;  Surgeon: Sherren Kerns, MD;  Location: [facility] INVASIVE CV LAB;  Service: Cardiovascular;  Laterality: N/A;  Bilateral   AMPUTATION Left 07/09/2019   Procedure: LEFT FOURTH and Fifth TOE AMPUTATION.;  Surgeon: Larina Earthly, MD;  Location: [facility] OR;  Service: Vascular;  Laterality: Left;   AMPUTATION Left 03/11/2021   Procedure: LEFT FOOT 5TH  AND 4TH RAY AMPUTATION;  Surgeon: Nadara Mustard, MD;  Location: [facility] OR;  Service: Orthopedics;  Laterality: Left;    AMPUTATION Left 08/28/2021   Procedure: AMPUTATION BELOW KNEE;  Surgeon: Nadara Mustard, MD;  Location: [facility] OR;  Service: Orthopedics;  Laterality: Left;   AMPUTATION Left 01/29/2023   Procedure: LEFT AMPUTATION BELOW KNEE REVISION AND CLOSURE;  Surgeon: Nadara Mustard, MD;  Location: [facility] OR;  Service: Orthopedics;  Laterality: Left;   AORTIC VALVE REPLACEMENT  N/A 12/05/2019   Procedure: PARTIAL STERNOTOMY FOR AORTIC VALVE REPLACEMENT (AVR), USING CARBOMEDICS SUPRA-ANNULAR TOP HAT ;  Surgeon: Purcell Nails, MD;  Location: [facility] OR;  Service: Open Heart Surgery;  Laterality: N/A;  No neck lines on left   APPLICATION OF WOUND VAC Left 10/30/2021   Procedure: APPLICATION OF WOUND VAC;  Surgeon: Nadara Mustard, MD;  Location: [facility] OR;  Service: Orthopedics;  Laterality: Left;   APPLICATION OF WOUND VAC Left 12/25/2021   Procedure: APPLICATION OF WOUND VAC;  Surgeon: Nadara Mustard, MD;  Location: [facility] OR;  Service: Orthopedics;  Laterality: Left;   APPLICATION OF WOUND VAC Left 01/26/2023   Procedure: APPLICATION OF WOUND VAC TO LEFT STUMP;  Surgeon: Tarry Kos, MD;  Location: [facility] OR;  Service: Orthopedics;  Laterality: Left;   CARDIAC VALVE REPLACEMENT N/A    Phreesia 09/27/2020   I & D EXTREMITY Left 01/26/2023   Procedure: IRRIGATION AND DEBRIDEMENT OF LEFT BKA STUMP;  Surgeon: Tarry Kos, MD;  Location: [facility] OR;  Service: Orthopedics;  Laterality: Left;   IR RADIOLOGY PERIPHERAL GUIDED IV START  10/05/2019   IR US GUIDE VASC ACCESS RIGHT  10/05/2019   MULTIPLE EXTRACTIONS WITH ALVEOLOPLASTY N/A 10/26/2019   Procedure: EXTRACTION OF TOOTH #'S 3, 5-11,19-28,  AND 32 WITH ALVEOLOPLASTY;  Surgeon: Charlynne Pander, DDS;  Location: [facility] OR;  Service: Oral Surgery;  Laterality: N/A;   RIGHT HEART CATH AND CORONARY ANGIOGRAPHY N/A 09/24/2019   Procedure: RIGHT HEART CATH AND CORONARY ANGIOGRAPHY;  Surgeon: Tonny Bollman, MD;  Location: [facility] INVASIVE CV LAB;  Service: Cardiovascular;  Laterality: N/A;   STUMP  REVISION Left 10/30/2021   Procedure: REVISION LEFT BELOW KNEE AMPUTATION;  Surgeon: Nadara Mustard, MD;  Location: [facility] OR;  Service: Orthopedics;  Laterality: Left;   STUMP REVISION Left 12/25/2021   Procedure: REVISION LEFT BELOW KNEE AMPUTATION;  Surgeon: Nadara Mustard, MD;  Location: [facility] OR;  Service: Orthopedics;  Laterality: Left;   TEE WITHOUT CARDIOVERSION N/A 12/05/2019   Procedure: TRANSESOPHAGEAL ECHOCARDIOGRAM (TEE);  Surgeon: Purcell Nails, MD;  Location: [facility] OR;  Service: Open Heart Surgery;  Laterality: N/A;   TEE WITHOUT CARDIOVERSION N/A 09/01/2021   Procedure: TRANSESOPHAGEAL ECHOCARDIOGRAM (TEE);  Surgeon: Little Ishikawa, MD;  Location: [facility] ENDOSCOPY;  Service: Cardiovascular;  Laterality: N/A;   Social History   Occupational History    Comment: Glass blower/designer- self-employed  Tobacco Use   Smoking status: Never   Smokeless tobacco: Former    Types: Chew    Quit date: 2021  Vaping Use   Vaping status: Never Used  Substance and Sexual Activity   Alcohol use: Yes    Alcohol/week: 4.0 standard drinks of alcohol    Types: 4 Cans of Long per week    Comment: occasionally   Drug use: No   Sexual activity: Yes    Birth control/protection: None

## 2023-04-20 ENCOUNTER — Ambulatory Visit: Payer: Medicaid Other | Attending: Cardiology | Admitting: *Deleted

## 2023-04-20 DIAGNOSIS — Z952 Presence of prosthetic heart valve: Secondary | ICD-10-CM

## 2023-04-20 DIAGNOSIS — Z5181 Encounter for therapeutic drug level monitoring: Secondary | ICD-10-CM | POA: Diagnosis not present

## 2023-04-20 LAB — POCT INR: INR: 2.3 (ref 2.0–3.0)

## 2023-04-20 NOTE — Patient Instructions (Signed)
Continue warfarin 1 tablet daily expcet for 1/2 tablet on Monday, Wednesday and Friday. Recheck in 4 wks.

## 2023-05-16 ENCOUNTER — Ambulatory Visit (INDEPENDENT_AMBULATORY_CARE_PROVIDER_SITE_OTHER): Payer: Medicaid Other | Admitting: Orthopedic Surgery

## 2023-05-16 DIAGNOSIS — Z89512 Acquired absence of left leg below knee: Secondary | ICD-10-CM

## 2023-05-17 ENCOUNTER — Encounter: Payer: Self-pay | Admitting: Orthopedic Surgery

## 2023-05-17 NOTE — Progress Notes (Signed)
Office Visit Note   Patient: Adam Long           Date of Birth: May 12, 1976           MRN: [id number] Visit Date: 05/16/2023              Requested by: [name], DO [address] [place],  [place] [address] PCP: [name], DO  Chief Complaint  Patient presents with   Left Leg - Follow-up    01/29/2023 left BKA revision         HPI: Patient is a 47 year old gentleman who is 3 months status post left below-knee amputation revision.  Patient also has a new traumatic wound on the right lower extremity where he struck his leg on the foot rest of the wheelchair on Saturday.  He complains of redness swelling and bleeding.  Assessment & Plan: Visit Diagnoses:  1. S/P BKA (below knee amputation), left (HCC)     Plan: The transtibial amputation is healed he will follow-up with [facility] for prosthetic fitting on the left.  We will apply Kerecis tissue graft to the wound right leg with Adaptic and Dynaflex wrap.  Follow-up in 1 week.  Follow-Up Instructions: Return in about 1 week (around 05/23/2023).   Ortho Exam  Patient is alert, oriented, no adenopathy, well-dressed, normal affect, normal respiratory effort. Examination the left transtibial potation is well-healed.  Examination the right lower extremity there is a new traumatic wound over the lateral calf that measures 3 x 2 cm.  Kerecis tissue graft plus Adaptic plus 4 x 4 and a Dynaflex compression wrap was applied.  Imaging: No results found.     Labs: Lab Results  Component Value Date   HGBA1C 13.4 (H) 01/24/2023   HGBA1C 8.6 (H) 04/27/2022   HGBA1C 6.4 (H) 10/30/2021   ESRSEDRATE 60 (H) 09/14/2021   ESRSEDRATE 65 (H) 09/07/2021   ESRSEDRATE 104 (H) 08/29/2021   CRP 1.5 (H) 09/14/2021   CRP 3.4 (H) 09/07/2021   CRP 12.8 (H) 08/29/2021   LABURIC 7.9 02/07/2023   REPTSTATUS 02/13/2023 FINAL 02/08/2023   GRAMSTAIN  01/26/2023    RARE WBC PRESENT, PREDOMINANTLY PMN FEW GRAM POSITIVE COCCI IN PAIRS FEW  GRAM POSITIVE COCCI IN CLUSTERS    CULT  02/08/2023    NO GROWTH 5 DAYS Performed at [facility], [address], [place], [place] [address]    LABORGA METHICILLIN RESISTANT STAPHYLOCOCCUS AUREUS 01/26/2023     Lab Results  Component Value Date   ALBUMIN 2.4 (L) 02/06/2023   ALBUMIN 1.9 (L) 01/26/2023   ALBUMIN 1.9 (L) 01/24/2023   PREALBUMIN 21.6 10/29/2021    Lab Results  Component Value Date   MG 2.0 02/11/2023   MG 2.1 02/10/2023   MG 2.1 02/09/2023   Lab Results  Component Value Date   VD25OH 15.4 (L) 04/27/2022   VD25OH 38 06/13/2020    Lab Results  Component Value Date   PREALBUMIN 21.6 10/29/2021      Latest Ref Rng & Units 02/17/2023    2:49 AM 02/16/2023    3:36 AM 02/15/2023    3:18 AM  CBC EXTENDED  WBC 4.0 - 10.5 K/uL 8.3  6.5  7.5   RBC 4.22 - 5.81 MIL/uL 3.49  3.42  3.43   Hemoglobin 13.0 - 17.0 g/dL 9.3  9.3  9.1   HCT 38.7 - 52.0 % 29.1  29.0  28.8   Platelets 150 - 400 K/uL 236  222  226      There is no height or weight on file to calculate BMI.  Orders:  No orders of the defined types were placed in this encounter.  No orders of the defined types were placed in this encounter.    Procedures: No procedures performed  Clinical Data: No additional findings.  ROS:  All other systems negative, except as noted in the HPI. Review of Systems  Objective: Vital Signs: There were no vitals taken for this visit.  Specialty Comments:  No specialty comments available.  PMFS History: Patient Active Problem List   Diagnosis Date Noted   Hepatic steatosis 03/25/2023   Acute blood loss anemia 02/06/2023   GERD (gastroesophageal reflux disease) 01/24/2023   History of osteomyelitis 10/12/2022   Uncontrolled type 2 diabetes mellitus with hyperglycemia (HCC) 10/12/2022   CAD (coronary artery disease) 10/12/2022   Encounter for therapeutic drug monitoring 10/12/2022   S/P BKA (below knee amputation), left (HCC) 10/30/2021    Dehiscence of amputation stump of left lower extremity (HCC)    Anxiety 09/30/2020   Preventative health care 12/13/2019   S/P aortic valve replacement with mechanical valve 12/05/2019   Hyperlipidemia associated with type 2 diabetes mellitus (HCC) 10/12/2015   Class 3 obesity (HCC) 10/12/2015   HTN (hypertension) 07/02/2013   Past Medical History:  Diagnosis Date   Anxiety    Aortic stenosis    Cellulitis and abscess of lower extremity 06/11/2019   Cellulitis of fourth toe of left foot    Cholelithiasis    Coronary artery disease    Nonobstructive CAD (40-50% LAD) 08/2019   Depression    Diabetes mellitus without complication (HCC)    Phreesia 09/27/2020   Elevated troponin level not due myocardial infarction 11/11/2019   Essential hypertension    Gangrene of toe of left foot (HCC) 07/06/2019   Heart murmur    Phreesia 09/27/2020   Hyperlipidemia    Phreesia 09/27/2020   Hypertension    Phreesia 09/27/2020   Mixed hyperlipidemia    Morbid obesity (HCC)    S/P aortic valve replacement with mechanical valve 12/05/2019   25 mm Carbomedics top hat bileaflet mechanical valve via partial upper hemi-sternotomy   Severe aortic stenosis 09/24/2019   Type 2 diabetes mellitus (HCC)     Family History  Problem Relation Age of Onset   Cancer Mother        Brain tumor   Heart disease Father    Hyperlipidemia Father    Hypertension Father    Stroke Father    Heart murmur Sister    Heart attack Maternal Grandmother    Liver disease Maternal Grandfather    Breast cancer Paternal Grandmother    COPD Paternal Grandfather    Heart attack Maternal Aunt    Colon cancer Maternal Uncle 67   Pancreatic cancer Paternal Aunt    Heart disease Paternal Uncle    Prostate cancer Neg Hx     Past Surgical History:  Procedure Laterality Date   ABDOMINAL AORTOGRAM W/LOWER EXTREMITY N/A 07/06/2019   Procedure: ABDOMINAL AORTOGRAM W/LOWER EXTREMITY;  Surgeon: Sherren Kerns, MD;  Location: [facility]  INVASIVE [facility] LAB;  Service: Cardiovascular;  Laterality: N/A;  Bilateral   AMPUTATION Left 07/09/2019   Procedure: LEFT FOURTH and Fifth TOE AMPUTATION.;  Surgeon: Larina Earthly, MD;  Location: [facility] OR;  Service: Vascular;  Laterality: Left;   AMPUTATION Left 03/11/2021   Procedure: LEFT FOOT 5TH  AND 4TH RAY AMPUTATION;  Surgeon: Aldean Baker  V, MD;  Location: [facility] OR;  Service: Orthopedics;  Laterality: Left;   AMPUTATION Left 08/28/2021   Procedure: AMPUTATION BELOW KNEE;  Surgeon: Nadara Mustard, MD;  Location: [facility] OR;  Service: Orthopedics;  Laterality: Left;   AMPUTATION Left 01/29/2023   Procedure: LEFT AMPUTATION BELOW KNEE REVISION AND CLOSURE;  Surgeon: Nadara Mustard, MD;  Location: [facility] OR;  Service: Orthopedics;  Laterality: Left;   AORTIC VALVE REPLACEMENT N/A 12/05/2019   Procedure: PARTIAL STERNOTOMY FOR AORTIC VALVE REPLACEMENT (AVR), USING CARBOMEDICS SUPRA-ANNULAR TOP HAT ;  Surgeon: Purcell Nails, MD;  Location: [facility] OR;  Service: Open Heart Surgery;  Laterality: N/A;  No neck lines on left   APPLICATION OF WOUND VAC Left 10/30/2021   Procedure: APPLICATION OF WOUND VAC;  Surgeon: Nadara Mustard, MD;  Location: [facility] OR;  Service: Orthopedics;  Laterality: Left;   APPLICATION OF WOUND VAC Left 12/25/2021   Procedure: APPLICATION OF WOUND VAC;  Surgeon: Nadara Mustard, MD;  Location: [facility] OR;  Service: Orthopedics;  Laterality: Left;   APPLICATION OF WOUND VAC Left 01/26/2023   Procedure: APPLICATION OF WOUND VAC TO LEFT STUMP;  Surgeon: Tarry Kos, MD;  Location: [facility] OR;  Service: Orthopedics;  Laterality: Left;   CARDIAC VALVE REPLACEMENT N/A    Phreesia 09/27/2020   I & D EXTREMITY Left 01/26/2023   Procedure: IRRIGATION AND DEBRIDEMENT OF LEFT BKA STUMP;  Surgeon: Tarry Kos, MD;  Location: [facility] OR;  Service: Orthopedics;  Laterality: Left;   IR RADIOLOGY PERIPHERAL GUIDED IV START  10/05/2019   IR US GUIDE VASC ACCESS RIGHT  10/05/2019   MULTIPLE EXTRACTIONS WITH ALVEOLOPLASTY N/A 10/26/2019    Procedure: EXTRACTION OF TOOTH #'S 3, 5-11,19-28,  AND 32 WITH ALVEOLOPLASTY;  Surgeon: Charlynne Pander, DDS;  Location: [facility] OR;  Service: Oral Surgery;  Laterality: N/A;   RIGHT HEART CATH AND CORONARY ANGIOGRAPHY N/A 09/24/2019   Procedure: RIGHT HEART CATH AND CORONARY ANGIOGRAPHY;  Surgeon: Tonny Bollman, MD;  Location: [facility] INVASIVE CV LAB;  Service: Cardiovascular;  Laterality: N/A;   STUMP REVISION Left 10/30/2021   Procedure: REVISION LEFT BELOW KNEE AMPUTATION;  Surgeon: Nadara Mustard, MD;  Location: [facility] OR;  Service: Orthopedics;  Laterality: Left;   STUMP REVISION Left 12/25/2021   Procedure: REVISION LEFT BELOW KNEE AMPUTATION;  Surgeon: Nadara Mustard, MD;  Location: [facility] OR;  Service: Orthopedics;  Laterality: Left;   TEE WITHOUT CARDIOVERSION N/A 12/05/2019   Procedure: TRANSESOPHAGEAL ECHOCARDIOGRAM (TEE);  Surgeon: Purcell Nails, MD;  Location[facility] OR;  Service: Open Heart Surgery;  Laterality: N/A;   TEE WITHOUT CARDIOVERSION N/A 09/01/2021   Procedure: TRANSESOPHAGEAL ECHOCARDIOGRAM (TEE);  Surgeon: Little Ishikawa, MD;  Location: [facility] ENDOSCOPY;  Service: Cardiovascular;  Laterality: N/A;   Social History   Occupational History    Comment: Glass blower/designer- self-employed  Tobacco Use   Smoking status: Never   Smokeless tobacco: Former    Types: Chew    Quit date: 2021  Vaping Use   Vaping status: Never Used  Substance and Sexual Activity   Alcohol use: Yes    Alcohol/week: 4.0 standard drinks of alcohol    Types: 4 Cans of beer per week    Comment: occasionally   Drug use: No   Sexual activity: Yes    Birth control/protection: None

## 2023-05-18 ENCOUNTER — Ambulatory Visit: Payer: Medicaid Other | Attending: Cardiology | Admitting: *Deleted

## 2023-05-18 DIAGNOSIS — Z952 Presence of prosthetic heart valve: Secondary | ICD-10-CM | POA: Diagnosis not present

## 2023-05-18 DIAGNOSIS — Z5181 Encounter for therapeutic drug level monitoring: Secondary | ICD-10-CM | POA: Diagnosis not present

## 2023-05-18 LAB — POCT INR: INR: 2.3 (ref 2.0–3.0)

## 2023-05-18 NOTE — Patient Instructions (Signed)
Continue warfarin 1 tablet daily expcet for 1/2 tablet on Monday, Wednesday and Friday. Recheck in 6 wks.

## 2023-05-24 ENCOUNTER — Ambulatory Visit (INDEPENDENT_AMBULATORY_CARE_PROVIDER_SITE_OTHER): Payer: Medicaid Other | Admitting: Family

## 2023-05-24 DIAGNOSIS — I87331 Chronic venous hypertension (idiopathic) with ulcer and inflammation of right lower extremity: Secondary | ICD-10-CM | POA: Diagnosis not present

## 2023-05-24 DIAGNOSIS — I872 Venous insufficiency (chronic) (peripheral): Secondary | ICD-10-CM

## 2023-05-25 NOTE — Progress Notes (Signed)
Post-Op Visit Note   Patient: Adam Long           Date of Birth: 02/26/1976           MRN: [id number] Visit Date: 05/24/2023 PCP: [name], DO  Chief Complaint:  Chief Complaint  Patient presents with   Right Leg - Wound Check    registry pt #20    HPI:  HPI Patient is a 47 year old gentleman who presents today in follow-up for venous ulceration to the right lower extremity caused by trauma.  He did have some Kerecis micro powder applied 1 week ago  No pain no new concerns Ortho Exam On examination of the right lower extremity there is chronic edema.  The lateral calf ulcer today measures 4 cm x 1 and half centimeters filled in with granulation there is no surrounding erythema or maceration.  The anterior ulcer today 2-1/2 cm x 2 cm without any depth this is superficial no erythema drainage Visit Diagnoses: No diagnosis found.  Plan: He will begin daily dose of cleansing and dry dressings wrapped with Ace wrap for compression will follow-up in 1 week for reapplication of Kerecis  Follow-Up Instructions: No follow-ups on file.   Imaging: No results found.  Orders:  No orders of the defined types were placed in this encounter.  No orders of the defined types were placed in this encounter.    PMFS History: Patient Active Problem List   Diagnosis Date Noted   Hepatic steatosis 03/25/2023   Acute blood loss anemia 02/06/2023   GERD (gastroesophageal reflux disease) 01/24/2023   History of osteomyelitis 10/12/2022   Uncontrolled type 2 diabetes mellitus with hyperglycemia (HCC) 10/12/2022   CAD (coronary artery disease) 10/12/2022   Encounter for therapeutic drug monitoring 10/12/2022   S/P BKA (below knee amputation), left (HCC) 10/30/2021   Dehiscence of amputation stump of left lower extremity (HCC)    Anxiety 09/30/2020   Preventative health care 12/13/2019   S/P aortic valve replacement with mechanical valve 12/05/2019   Hyperlipidemia associated with  type 2 diabetes mellitus (HCC) 10/12/2015   Class 3 obesity (HCC) 10/12/2015   HTN (hypertension) 07/02/2013   Past Medical History:  Diagnosis Date   Anxiety    Aortic stenosis    Cellulitis and abscess of lower extremity 06/11/2019   Cellulitis of fourth toe of left foot    Cholelithiasis    Coronary artery disease    Nonobstructive CAD (40-50% LAD) 08/2019   Depression    Diabetes mellitus without complication (HCC)    Phreesia 09/27/2020   Elevated troponin level not due myocardial infarction 11/11/2019   Essential hypertension    Gangrene of toe of left foot (HCC) 07/06/2019   Heart murmur    Phreesia 09/27/2020   Hyperlipidemia    Phreesia 09/27/2020   Hypertension    Phreesia 09/27/2020   Mixed hyperlipidemia    Morbid obesity (HCC)    S/P aortic valve replacement with mechanical valve 12/05/2019   25 mm Carbomedics top hat bileaflet mechanical valve via partial upper hemi-sternotomy   Severe aortic stenosis 09/24/2019   Type 2 diabetes mellitus (HCC)     Family History  Problem Relation Age of Onset   Cancer Mother        Brain tumor   Heart disease Father    Hyperlipidemia Father    Hypertension Father    Stroke Father    Heart murmur Sister    Heart attack Maternal Grandmother    Liver  disease Maternal Grandfather    Breast cancer Paternal Grandmother    COPD Paternal Grandfather    Heart attack Maternal Aunt    Colon cancer Maternal Uncle 67   Pancreatic cancer Paternal Aunt    Heart disease Paternal Uncle    Prostate cancer Neg Hx     Past Surgical History:  Procedure Laterality Date   ABDOMINAL AORTOGRAM W/LOWER EXTREMITY N/A 07/06/2019   Procedure: ABDOMINAL AORTOGRAM W/LOWER EXTREMITY;  Surgeon: Sherren Kerns, MD;  [facility];  Service: Cardiovascular;  Laterality: N/A;  Bilateral   AMPUTATION Left 07/09/2019   Procedure: LEFT FOURTH and Fifth TOE AMPUTATION.;  Surgeon: Larina Earthly, MD;  Location: [facility] OR;  Service: Vascular;   Laterality: Left;   AMPUTATION Left 03/11/2021   Procedure: LEFT FOOT 5TH  AND 4TH RAY AMPUTATION;  Surgeon: Nadara Mustard, MD;  Location: [facility] OR;  Service: Orthopedics;  Laterality: Left;   AMPUTATION Left 08/28/2021   Procedure: AMPUTATION BELOW KNEE;  Surgeon: Nadara Mustard, MD;  Location: [facility] OR;  Service: Orthopedics;  Laterality: Left;   AMPUTATION Left 01/29/2023   Procedure: LEFT AMPUTATION BELOW KNEE REVISION AND CLOSURE;  Surgeon: Nadara Mustard, MD;  [facility]: [facility] OR;  Service: Orthopedics;  Laterality: Left;   AORTIC VALVE REPLACEMENT N/A 12/05/2019   Procedure: PARTIAL STERNOTOMY FOR AORTIC VALVE REPLACEMENT (AVR), USING CARBOMEDICS SUPRA-ANNULAR TOP HAT ;  Surgeon: Purcell Nails, MD;  Location[facility] OR;  Service: Open Heart Surgery;  Laterality: N/A;  No neck lines on left   APPLICATION OF WOUND VAC Left 10/30/2021   Procedure: APPLICATION OF WOUND VAC;  Surgeon: Nadara Mustard, MD;  [facility]: [facility] OR;  Service: Orthopedics;  Laterality: Left;   APPLICATION OF WOUND VAC Left 12/25/2021   Procedure: APPLICATION OF WOUND VAC;  Surgeon: Nadara Mustard, MD;  [facility]: [facility] OR;  Service: Orthopedics;  Laterality: Left;   APPLICATION OF WOUND VAC Left 01/26/2023   Procedure: APPLICATION OF WOUND VAC TO LEFT STUMP;  Surgeon: Tarry Kos, MD;  [facility] OR;  Service: Orthopedics;  Laterality: Left;   CARDIAC VALVE REPLACEMENT N/A    Phreesia 09/27/2020   I & D EXTREMITY Left 01/26/2023   Procedure: IRRIGATION AND DEBRIDEMENT OF LEFT BKA STUMP;  Surgeon: Tarry Kos, MD;  [facility]: [facility] OR;  Service: Orthopedics;  Laterality: Left;   IR RADIOLOGY PERIPHERAL GUIDED IV START  10/05/2019   IR US GUIDE VASC ACCESS RIGHT  10/05/2019   MULTIPLE EXTRACTIONS WITH ALVEOLOPLASTY N/A 10/26/2019   Procedure: EXTRACTION OF TOOTH #'S 3, 5-11,19-28,  AND 32 WITH ALVEOLOPLASTY;  Surgeon: Charlynne Pander, DDS;  Location: [facility] OR;  Service: Oral Surgery;  Laterality: N/A;   RIGHT HEART CATH AND CORONARY ANGIOGRAPHY  N/A 09/24/2019   Procedure: RIGHT HEART CATH AND CORONARY ANGIOGRAPHY;  Surgeon: Tonny Bollman, MD;  Location: [facility] INVASIVE CV LAB;  Service: Cardiovascular;  Laterality: N/A;   STUMP REVISION Left 10/30/2021   Procedure: REVISION LEFT BELOW KNEE AMPUTATION;  Surgeon: Nadara Mustard, MD;  Location: [facility] OR;  Service: Orthopedics;  Laterality: Left;   STUMP REVISION Left 12/25/2021   Procedure: REVISION LEFT BELOW KNEE AMPUTATION;  Surgeon: Nadara Mustard, MD;  Location: [facility] OR;  Service: Orthopedics;  Laterality: Left;   TEE WITHOUT CARDIOVERSION N/A 12/05/2019   Procedure: TRANSESOPHAGEAL ECHOCARDIOGRAM (TEE);  Surgeon: Purcell Nails, MD;  Location: [facility] OR;  Service: Open Heart Surgery;  Laterality: N/A;   TEE WITHOUT CARDIOVERSION N/A 09/01/2021  Procedure: TRANSESOPHAGEAL ECHOCARDIOGRAM (TEE);  Surgeon: Little Ishikawa, MD;  Location: [facility] ENDOSCOPY;  Service: Cardiovascular;  Laterality: N/A;   Social History   Occupational History    Comment: Glass blower/designer- self-employed  Tobacco Use   Smoking status: Never   Smokeless tobacco: Former    Types: Chew    Quit date: 2021  Vaping Use   Vaping status: Never Used  Substance and Sexual Activity   Alcohol use: Yes    Alcohol/week: 4.0 standard drinks of alcohol    Types: 4 Cans of beer per week    Comment: occasionally   Drug use: No   Sexual activity: Yes    Birth control/protection: None

## 2023-05-31 ENCOUNTER — Encounter: Payer: Self-pay | Admitting: Family

## 2023-05-31 ENCOUNTER — Ambulatory Visit (INDEPENDENT_AMBULATORY_CARE_PROVIDER_SITE_OTHER): Payer: Medicaid Other | Admitting: Family

## 2023-05-31 DIAGNOSIS — Z89512 Acquired absence of left leg below knee: Secondary | ICD-10-CM

## 2023-05-31 DIAGNOSIS — L98491 Non-pressure chronic ulcer of skin of other sites limited to breakdown of skin: Secondary | ICD-10-CM

## 2023-05-31 NOTE — Progress Notes (Signed)
Office Visit Note   Patient: Adam Long           Date of Birth: December 03, 1975           MRN: [id number] Visit Date: 05/31/2023              Requested by: [name], DO [address] [place],  [place] [address] PCP: [name], DO  Chief Complaint  Patient presents with   Right Leg - Wound Check    registry pt #20      HPI: The patient is a 47 year old gentleman who presents in follow-up  Did have Kerecis placement with Adaptic and a Dynaflex 2 weeks ago.  The Dynaflex was removed 1 week ago  Assessment & Plan: Visit Diagnoses: No diagnosis found.  Plan: Kerecis reapplied to the right lower extremity today.  He will leave this in place for 1 week.  Follow-Up Instructions: No follow-ups on file.   Ortho Exam  Patient is alert, oriented, no adenopathy, well-dressed, normal affect, normal respiratory effort. On examination of the right lower extremity there is a traumatic ulcer of the lateral calf which now measures 2 cm x 2 cm with no depth.  There is an anterior ulcer which is superficial 3 cm x 2 cm which is new.  There is no active drainage no surrounding erythema or sign of infection no cellulitis  Imaging: No results found.     Labs: Lab Results  Component Value Date   HGBA1C 13.4 (H) 01/24/2023   HGBA1C 8.6 (H) 04/27/2022   HGBA1C 6.4 (H) 10/30/2021   ESRSEDRATE 60 (H) 09/14/2021   ESRSEDRATE 65 (H) 09/07/2021   ESRSEDRATE 104 (H) 08/29/2021   CRP 1.5 (H) 09/14/2021   CRP 3.4 (H) 09/07/2021   CRP 12.8 (H) 08/29/2021   LABURIC 7.9 02/07/2023   REPTSTATUS 02/13/2023 FINAL 02/08/2023   GRAMSTAIN  01/26/2023    RARE WBC PRESENT, PREDOMINANTLY PMN FEW GRAM POSITIVE COCCI IN PAIRS FEW GRAM POSITIVE COCCI IN CLUSTERS    CULT  02/08/2023    NO GROWTH 5 DAYS Performed at [facility], [address], [place], [place] [address]    LABORGA METHICILLIN RESISTANT STAPHYLOCOCCUS AUREUS 01/26/2023     Lab Results  Component Value Date    ALBUMIN 2.4 (L) 02/06/2023   ALBUMIN 1.9 (L) 01/26/2023   ALBUMIN 1.9 (L) 01/24/2023   PREALBUMIN 21.6 10/29/2021    Lab Results  Component Value Date   MG 2.0 02/11/2023   MG 2.1 02/10/2023   MG 2.1 02/09/2023   Lab Results  Component Value Date   VD25OH 15.4 (L) 04/27/2022   VD25OH 38 06/13/2020    Lab Results  Component Value Date   PREALBUMIN 21.6 10/29/2021      Latest Ref Rng & Units 02/17/2023    2:49 AM 02/16/2023    3:36 AM 02/15/2023    3:18 AM  CBC EXTENDED  WBC 4.0 - 10.5 K/uL 8.3  6.5  7.5   RBC 4.22 - 5.81 MIL/uL 3.49  3.42  3.43   Hemoglobin 13.0 - 17.0 g/dL 9.3  9.3  9.1   HCT 62.9 - 52.0 % 29.1  29.0  28.8   Platelets 150 - 400 K/uL 236  222  226      There is no height or weight on file to calculate BMI.  Orders:  No orders of the defined types were placed in this encounter.  No orders of the defined types  were placed in this encounter.    Procedures: No procedures performed  Clinical Data: No additional findings.  ROS:  All other systems negative, except as noted in the HPI. Review of Systems  Constitutional: Negative.   Cardiovascular:  Positive for leg swelling.  Skin:  Positive for wound. Negative for color change.    Objective: Vital Signs: There were no vitals taken for this visit.  Specialty Comments:  No specialty comments available.  PMFS History: Patient Active Problem List   Diagnosis Date Noted   Hepatic steatosis 03/25/2023   Acute blood loss anemia 02/06/2023   GERD (gastroesophageal reflux disease) 01/24/2023   History of osteomyelitis 10/12/2022   Uncontrolled type 2 diabetes mellitus with hyperglycemia (HCC) 10/12/2022   CAD (coronary artery disease) 10/12/2022   Encounter for therapeutic drug monitoring 10/12/2022   S/P BKA (below knee amputation), left (HCC) 10/30/2021   Dehiscence of amputation stump of left lower extremity (HCC)    Anxiety 09/30/2020   Preventative health care 12/13/2019   S/P aortic  valve replacement with mechanical valve 12/05/2019   Hyperlipidemia associated with type 2 diabetes mellitus (HCC) 10/12/2015   Class 3 obesity (HCC) 10/12/2015   HTN (hypertension) 07/02/2013   Past Medical History:  Diagnosis Date   Anxiety    Aortic stenosis    Cellulitis and abscess of lower extremity 06/11/2019   Cellulitis of fourth toe of left foot    Cholelithiasis    Coronary artery disease    Nonobstructive CAD (40-50% LAD) 08/2019   Depression    Diabetes mellitus without complication (HCC)    Phreesia 09/27/2020   Elevated troponin level not due myocardial infarction 11/11/2019   Essential hypertension    Gangrene of toe of left foot (HCC) 07/06/2019   Heart murmur    Phreesia 09/27/2020   Hyperlipidemia    Phreesia 09/27/2020   Hypertension    Phreesia 09/27/2020   Mixed hyperlipidemia    Morbid obesity (HCC)    S/P aortic valve replacement with mechanical valve 12/05/2019   25 mm Carbomedics top hat bileaflet mechanical valve via partial upper hemi-sternotomy   Severe aortic stenosis 09/24/2019   Type 2 diabetes mellitus (HCC)     Family History  Problem Relation Age of Onset   Cancer Mother        Brain tumor   Heart disease Father    Hyperlipidemia Father    Hypertension Father    Stroke Father    Heart murmur Sister    Heart attack Maternal Grandmother    Liver disease Maternal Grandfather    Breast cancer Paternal Grandmother    COPD Paternal Grandfather    Heart attack Maternal Aunt    Colon cancer Maternal Uncle 67   Pancreatic cancer Paternal Aunt    Heart disease Paternal Uncle    Prostate cancer Neg Hx     Past Surgical History:  Procedure Laterality Date   ABDOMINAL AORTOGRAM W/LOWER EXTREMITY N/A 07/06/2019   Procedure: ABDOMINAL AORTOGRAM W/LOWER EXTREMITY;  Surgeon: Sherren Kerns, MD;  [facility]: [facility];  Service: Cardiovascular;  Laterality: N/A;  Bilateral   AMPUTATION Left 07/09/2019   Procedure: LEFT FOURTH and  Fifth TOE AMPUTATION.;  Surgeon: Larina Earthly, MD;  Location: [facility] OR;  Service: Vascular;  Laterality: Left;   AMPUTATION Left 03/11/2021   Procedure: LEFT FOOT 5TH  AND 4TH RAY AMPUTATION;  Surgeon: Nadara Mustard, MD;  Location: [facility] OR;  Service: Orthopedics;  Laterality: Left;   AMPUTATION Left 08/28/2021  Procedure: AMPUTATION BELOW KNEE;  Surgeon: Nadara Mustard, MD;  Location: [facility] OR;  Service: Orthopedics;  Laterality: Left;   AMPUTATION Left 01/29/2023   Procedure: LEFT AMPUTATION BELOW KNEE REVISION AND CLOSURE;  Surgeon: Nadara Mustard, MD;  [facility] OR;  Service: Orthopedics;  Laterality: Left;   AORTIC VALVE REPLACEMENT N/A 12/05/2019   Procedure: PARTIAL STERNOTOMY FOR AORTIC VALVE REPLACEMENT (AVR), USING CARBOMEDICS SUPRA-ANNULAR TOP HAT ;  Surgeon: Purcell Nails, MD;  Location: [facility] OR;  Service: Open Heart Surgery;  Laterality: N/A;  No neck lines on left   APPLICATION OF WOUND VAC Left 10/30/2021   Procedure: APPLICATION OF WOUND VAC;  Surgeon: Nadara Mustard, MD;  [facility]: [facility] OR;  Service: Orthopedics;  Laterality: Left;   APPLICATION OF WOUND VAC Left 12/25/2021   Procedure: APPLICATION OF WOUND VAC;  Surgeon: Nadara Mustard, MD;  Location: [facility] OR;  Service: Orthopedics;  Laterality: Left;   APPLICATION OF WOUND VAC Left 01/26/2023   Procedure: APPLICATION OF WOUND VAC TO LEFT STUMP;  Surgeon: Tarry Kos, MD;  Location: [facility] OR;  Service: Orthopedics;  Laterality: Left;   CARDIAC VALVE REPLACEMENT N/A    Phreesia 09/27/2020   I & D EXTREMITY Left 01/26/2023   Procedure: IRRIGATION AND DEBRIDEMENT OF LEFT BKA STUMP;  Surgeon: Tarry Kos, MD;  Location: [facility] OR;  Service: Orthopedics;  Laterality: Left;   IR RADIOLOGY PERIPHERAL GUIDED IV START  10/05/2019   IR US GUIDE VASC ACCESS RIGHT  10/05/2019   MULTIPLE EXTRACTIONS WITH ALVEOLOPLASTY N/A 10/26/2019   Procedure: EXTRACTION OF TOOTH #'S 3, 5-11,19-28,  AND 32 WITH ALVEOLOPLASTY;  Surgeon: Charlynne Pander, DDS;  Location: [facility]  OR;  Service: Oral Surgery;  Laterality: N/A;   RIGHT HEART CATH AND CORONARY ANGIOGRAPHY N/A 09/24/2019   Procedure: RIGHT HEART CATH AND CORONARY ANGIOGRAPHY;  Surgeon: Tonny Bollman, MD;  Location: [facility] INVASIVE CV LAB;  Service: Cardiovascular;  Laterality: N/A;   STUMP REVISION Left 10/30/2021   Procedure: REVISION LEFT BELOW KNEE AMPUTATION;  Surgeon: Nadara Mustard, MD;  Location: [facility] OR;  Service: Orthopedics;  Laterality: Left;   STUMP REVISION Left 12/25/2021   Procedure: REVISION LEFT BELOW KNEE AMPUTATION;  Surgeon: Nadara Mustard, MD;  Location: [facility] OR;  Service: Orthopedics;  Laterality: Left;   TEE WITHOUT CARDIOVERSION N/A 12/05/2019   Procedure: TRANSESOPHAGEAL ECHOCARDIOGRAM (TEE);  Surgeon: Purcell Nails, MD;  Location: [facility] OR;  Service: Open Heart Surgery;  Laterality: N/A;   TEE WITHOUT CARDIOVERSION N/A 09/01/2021   Procedure: TRANSESOPHAGEAL ECHOCARDIOGRAM (TEE);  Surgeon: Little Ishikawa, MD;  Location: [facility] ENDOSCOPY;  Service: Cardiovascular;  Laterality: N/A;   Social History   Occupational History    Comment: Glass blower/designer- self-employed  Tobacco Use   Smoking status: Never   Smokeless tobacco: Former    Types: Chew    Quit date: 2021  Vaping Use   Vaping status: Never Used  Substance and Sexual Activity   Alcohol use: Yes    Alcohol/week: 4.0 standard drinks of alcohol    Types: 4 Cans of beer per week    Comment: occasionally   Drug use: No   Sexual activity: Yes    Birth control/protection: None

## 2023-06-01 ENCOUNTER — Encounter: Payer: Self-pay | Admitting: Family

## 2023-06-07 ENCOUNTER — Ambulatory Visit (INDEPENDENT_AMBULATORY_CARE_PROVIDER_SITE_OTHER): Payer: Medicaid Other | Admitting: Family

## 2023-06-07 DIAGNOSIS — I87331 Chronic venous hypertension (idiopathic) with ulcer and inflammation of right lower extremity: Secondary | ICD-10-CM | POA: Diagnosis not present

## 2023-06-07 DIAGNOSIS — I872 Venous insufficiency (chronic) (peripheral): Secondary | ICD-10-CM

## 2023-06-07 DIAGNOSIS — L98491 Non-pressure chronic ulcer of skin of other sites limited to breakdown of skin: Secondary | ICD-10-CM

## 2023-06-07 NOTE — Progress Notes (Unsigned)
Office Visit Note   Patient: Adam Long           Date of Birth: 04/19/1976           MRN: [id number] Visit Date: 06/07/2023              Requested by: [name], DO [address] [place],  [place] [address] PCP: [name], DO  Chief Complaint  Patient presents with  . Right Leg - Wound Check     registry pt #20 visit #3        HPI: The patient is a 47 year old gentleman who presents in follow-up   Assessment & Plan: Visit Diagnoses: No diagnosis found.  Plan: silvercell and dynaflex to the right lower extremity today.  He will leave this in place for 1 week.  Follow-Up Instructions: No follow-ups on file.   Ortho Exam  Patient is alert, oriented, no adenopathy, well-dressed, normal affect, normal respiratory effort. On examination of the right lower extremity there is a traumatic ulcer of the lateral calf which now measures 2 cm x 2 cm with no depth.    Imaging: No results found.     Labs: Lab Results  Component Value Date   HGBA1C 13.4 (H) 01/24/2023   HGBA1C 8.6 (H) 04/27/2022   HGBA1C 6.4 (H) 10/30/2021   ESRSEDRATE 60 (H) 09/14/2021   ESRSEDRATE 65 (H) 09/07/2021   ESRSEDRATE 104 (H) 08/29/2021   CRP 1.5 (H) 09/14/2021   CRP 3.4 (H) 09/07/2021   CRP 12.8 (H) 08/29/2021   LABURIC 7.9 02/07/2023   REPTSTATUS 02/13/2023 FINAL 02/08/2023   GRAMSTAIN  01/26/2023    RARE WBC PRESENT, PREDOMINANTLY PMN FEW GRAM POSITIVE COCCI IN PAIRS FEW GRAM POSITIVE COCCI IN CLUSTERS    CULT  02/08/2023    NO GROWTH 5 DAYS Performed at [facility], [address], [place], [place] [address]    LABORGA METHICILLIN RESISTANT STAPHYLOCOCCUS AUREUS 01/26/2023     Lab Results  Component Value Date   ALBUMIN 2.4 (L) 02/06/2023   ALBUMIN 1.9 (L) 01/26/2023   ALBUMIN 1.9 (L) 01/24/2023   PREALBUMIN 21.6 10/29/2021    Lab Results  Component Value Date   MG 2.0 02/11/2023   MG 2.1 02/10/2023   MG 2.1 02/09/2023   Lab Results   Component Value Date   VD25OH 15.4 (L) 04/27/2022   VD25OH 38 06/13/2020    Lab Results  Component Value Date   PREALBUMIN 21.6 10/29/2021      Latest Ref Rng & Units 02/17/2023    2:49 AM 02/16/2023    3:36 AM 02/15/2023    3:18 AM  CBC EXTENDED  WBC 4.0 - 10.5 K/uL 8.3  6.5  7.5   RBC 4.22 - 5.81 MIL/uL 3.49  3.42  3.43   Hemoglobin 13.0 - 17.0 g/dL 9.3  9.3  9.1   HCT 96.2 - 52.0 % 29.1  29.0  28.8   Platelets 150 - 400 K/uL 236  222  226      There is no height or weight on file to calculate BMI.  Orders:  No orders of the defined types were placed in this encounter.  No orders of the defined types were placed in this encounter.    Procedures: No procedures performed  Clinical Data: No additional findings.  ROS:  All other systems negative, except as noted in the HPI. Review of Systems  Constitutional: Negative.   Cardiovascular:  Positive for leg  swelling.  Skin:  Positive for wound. Negative for color change.    Objective: Vital Signs: There were no vitals taken for this visit.  Specialty Comments:  No specialty comments available.  PMFS History: Patient Active Problem List   Diagnosis Date Noted  . Hepatic steatosis 03/25/2023  . Acute blood loss anemia 02/06/2023  . GERD (gastroesophageal reflux disease) 01/24/2023  . History of osteomyelitis 10/12/2022  . Uncontrolled type 2 diabetes mellitus with hyperglycemia (HCC) 10/12/2022  . CAD (coronary artery disease) 10/12/2022  . Encounter for therapeutic drug monitoring 10/12/2022  . S/P BKA (below knee amputation), left (HCC) 10/30/2021  . Dehiscence of amputation stump of left lower extremity (HCC)   . Anxiety 09/30/2020  . Preventative health care 12/13/2019  . S/P aortic valve replacement with mechanical valve 12/05/2019  . Hyperlipidemia associated with type 2 diabetes mellitus (HCC) 10/12/2015  . Class 3 obesity (HCC) 10/12/2015  . HTN (hypertension) 07/02/2013   Past Medical History:   Diagnosis Date  . Anxiety   . Aortic stenosis   . Cellulitis and abscess of lower extremity 06/11/2019  . Cellulitis of fourth toe of left foot   . Cholelithiasis   . Coronary artery disease    Nonobstructive CAD (40-50% LAD) 08/2019  . Depression   . Diabetes mellitus without complication (HCC)    Phreesia 09/27/2020  . Elevated troponin level not due myocardial infarction 11/11/2019  . Essential hypertension   . Gangrene of toe of left foot (HCC) 07/06/2019  . Heart murmur    Phreesia 09/27/2020  . Hyperlipidemia    Phreesia 09/27/2020  . Hypertension    Phreesia 09/27/2020  . Mixed hyperlipidemia   . Morbid obesity (HCC)   . S/P aortic valve replacement with mechanical valve 12/05/2019   25 mm Carbomedics top hat bileaflet mechanical valve via partial upper hemi-sternotomy  . Severe aortic stenosis 09/24/2019  . Type 2 diabetes mellitus (HCC)     Family History  Problem Relation Age of Onset  . Cancer Mother        Brain tumor  . Heart disease Father   . Hyperlipidemia Father   . Hypertension Father   . Stroke Father   . Heart murmur Sister   . Heart attack Maternal Grandmother   . Liver disease Maternal Grandfather   . Breast cancer Paternal Grandmother   . COPD Paternal Grandfather   . Heart attack Maternal Aunt   . Colon cancer Maternal Uncle 67  . Pancreatic cancer Paternal Aunt   . Heart disease Paternal Uncle   . Prostate cancer Neg Hx     Past Surgical History:  Procedure Laterality Date  . ABDOMINAL AORTOGRAM W/LOWER EXTREMITY N/A 07/06/2019   Procedure: ABDOMINAL AORTOGRAM W/LOWER EXTREMITY;  Surgeon: Sherren Kerns, MD;  [facility]: [facility] INVASIVE [facility] LAB;  Service: Cardiovascular;  Laterality: N/A;  Bilateral  . AMPUTATION Left 07/09/2019   Procedure: LEFT FOURTH and Fifth TOE AMPUTATION.;  Surgeon: Larina Earthly, MD;  Location: [facility] OR;  Service: Vascular;  Laterality: Left;  . AMPUTATION Left 03/11/2021   Procedure: LEFT FOOT 5TH  AND 4TH RAY  AMPUTATION;  Surgeon: Nadara Mustard, MD;  Location: [facility] OR;  Service: Orthopedics;  Laterality: Left;  . AMPUTATION Left 08/28/2021   Procedure: AMPUTATION BELOW KNEE;  Surgeon: Nadara Mustard, MD;  Location: [facility] OR;  Service: Orthopedics;  Laterality: Left;  . AMPUTATION Left 01/29/2023   Procedure: LEFT AMPUTATION BELOW KNEE REVISION AND CLOSURE;  Surgeon: Nadara Mustard, MD;  Location: [facility] OR;  Service: Orthopedics;  Laterality: Left;  . AORTIC VALVE REPLACEMENT N/A 12/05/2019   Procedure: PARTIAL STERNOTOMY FOR AORTIC VALVE REPLACEMENT (AVR), USING CARBOMEDICS SUPRA-ANNULAR TOP HAT ;  Surgeon: Purcell Nails, MD;  Location: [facility] OR;  Service: Open Heart Surgery;  Laterality: N/A;  No neck lines on left  . APPLICATION OF WOUND VAC Left 10/30/2021   Procedure: APPLICATION OF WOUND VAC;  Surgeon: Nadara Mustard, MD;  Location: [facility] OR;  Service: Orthopedics;  Laterality: Left;  . APPLICATION OF WOUND VAC Left 12/25/2021   Procedure: APPLICATION OF WOUND VAC;  Surgeon: Nadara Mustard, MD;  Location: [facility] OR;  Service: Orthopedics;  Laterality: Left;  . APPLICATION OF WOUND VAC Left 01/26/2023   Procedure: APPLICATION OF WOUND VAC TO LEFT STUMP;  Surgeon: Tarry Kos, MD;  Location: [facility] OR;  Service: Orthopedics;  Laterality: Left;  . CARDIAC VALVE REPLACEMENT N/A    Phreesia 09/27/2020  . I & D EXTREMITY Left 01/26/2023   Procedure: IRRIGATION AND DEBRIDEMENT OF LEFT BKA STUMP;  Surgeon: Tarry Kos, MD;  Location: [facility] OR;  Service: Orthopedics;  Laterality: Left;  . IR RADIOLOGY PERIPHERAL GUIDED IV START  10/05/2019  . IR US GUIDE VASC ACCESS RIGHT  10/05/2019  . MULTIPLE EXTRACTIONS WITH ALVEOLOPLASTY N/A 10/26/2019   Procedure: EXTRACTION OF TOOTH #'S 3, 5-11,19-28,  AND 32 WITH ALVEOLOPLASTY;  Surgeon: Charlynne Pander, DDS;  Location: [facility] OR;  Service: Oral Surgery;  Laterality: N/A;  . RIGHT HEART CATH AND CORONARY ANGIOGRAPHY N/A 09/24/2019   Procedure: RIGHT HEART CATH AND CORONARY ANGIOGRAPHY;   Surgeon: Tonny Bollman, MD;  Location: [facility] INVASIVE CV LAB;  Service: Cardiovascular;  Laterality: N/A;  . STUMP REVISION Left 10/30/2021   Procedure: REVISION LEFT BELOW KNEE AMPUTATION;  Surgeon: Nadara Mustard, MD;  Location: [facility] OR;  Service: Orthopedics;  Laterality: Left;  . STUMP REVISION Left 12/25/2021   Procedure: REVISION LEFT BELOW KNEE AMPUTATION;  Surgeon: Nadara Mustard, MD;  Location: [facility] OR;  Service: Orthopedics;  Laterality: Left;  . TEE WITHOUT CARDIOVERSION N/A 12/05/2019   Procedure: TRANSESOPHAGEAL ECHOCARDIOGRAM (TEE);  Surgeon: Purcell Nails, MD;  Location: [facility] OR;  Service: Open Heart Surgery;  Laterality: N/A;  . TEE WITHOUT CARDIOVERSION N/A 09/01/2021   Procedure: TRANSESOPHAGEAL ECHOCARDIOGRAM (TEE);  Surgeon: Little Ishikawa, MD;  Location: [facility] ENDOSCOPY;  Service: Cardiovascular;  Laterality: N/A;   Social History   Occupational History    Comment: Glass blower/designer- self-employed  Tobacco Use  . Smoking status: Never  . Smokeless tobacco: Former    Types: Dorna Bloom    Quit date: 2021  Vaping Use  . Vaping status: Never Used  Substance and Sexual Activity  . Alcohol use: Yes    Alcohol/week: 4.0 standard drinks of alcohol    Types: 4 Cans of beer per week    Comment: occasionally  . Drug use: No  . Sexual activity: Yes    Birth control/protection: None

## 2023-06-08 ENCOUNTER — Encounter: Payer: Self-pay | Admitting: Family

## 2023-06-14 ENCOUNTER — Encounter: Payer: Self-pay | Admitting: Family

## 2023-06-14 ENCOUNTER — Ambulatory Visit (INDEPENDENT_AMBULATORY_CARE_PROVIDER_SITE_OTHER): Payer: Medicaid Other | Admitting: Family

## 2023-06-14 DIAGNOSIS — L98491 Non-pressure chronic ulcer of skin of other sites limited to breakdown of skin: Secondary | ICD-10-CM | POA: Diagnosis not present

## 2023-06-14 DIAGNOSIS — I872 Venous insufficiency (chronic) (peripheral): Secondary | ICD-10-CM | POA: Diagnosis not present

## 2023-06-14 NOTE — Progress Notes (Signed)
Office Visit Note   Patient: Adam Long           Date of Birth: [birth year]           MRN: [id number] Visit Date: 06/14/2023              Requested by: [name], DO [address] [place],  [place] [address] PCP: [name], DO  Chief Complaint  Patient presents with   Right Leg - Follow-up    registry pt #20 visit #4      HPI: The patient is a 47 year old gentleman who presents in follow-up. Has been with a dry dressing and an ace wrap for the last week.   Assessment & Plan: Visit Diagnoses: No diagnosis found.  Plan: silvercell, unna and dynaflex to the right lower extremity today.  He will leave this in place for 1 week.  Measured today for CircAid's.  The plan will be to get him in to CircAid compression on the right lower extremity once wound is healed.  Follow-Up Instructions: No follow-ups on file.   Ortho Exam  Patient is alert, oriented, no adenopathy, well-dressed, normal affect, normal respiratory effort. On examination of the right lower extremity there is a traumatic ulcer of the lateral calf which now measures 1.5 cm x 1.5 cm with no depth.  Weeping serous fluid.  No surrounding erythema or warmth.  Imaging: No results found.     Labs: Lab Results  Component Value Date   HGBA1C 13.4 (H) 01/24/2023   HGBA1C 8.6 (H) 04/27/2022   HGBA1C 6.4 (H) 10/30/2021   ESRSEDRATE 60 (H) 09/14/2021   ESRSEDRATE 65 (H) 09/07/2021   ESRSEDRATE 104 (H) 08/29/2021   CRP 1.5 (H) 09/14/2021   CRP 3.4 (H) 09/07/2021   CRP 12.8 (H) 08/29/2021   LABURIC 7.9 02/07/2023   REPTSTATUS 02/13/2023 FINAL 02/08/2023   GRAMSTAIN  01/26/2023    RARE WBC PRESENT, PREDOMINANTLY PMN FEW GRAM POSITIVE COCCI IN PAIRS FEW GRAM POSITIVE COCCI IN CLUSTERS    CULT  02/08/2023    NO GROWTH 5 DAYS Performed at [facility], [address] [place], [place] [address]    LABORGA METHICILLIN RESISTANT STAPHYLOCOCCUS AUREUS 01/26/2023     Lab Results   Component Value Date   ALBUMIN 2.4 (L) 02/06/2023   ALBUMIN 1.9 (L) 01/26/2023   ALBUMIN 1.9 (L) 01/24/2023   PREALBUMIN 21.6 10/29/2021    Lab Results  Component Value Date   MG 2.0 02/11/2023   MG 2.1 02/10/2023   MG 2.1 02/09/2023   Lab Results  Component Value Date   VD25OH 15.4 (L) 04/27/2022   VD25OH 38 06/13/2020    Lab Results  Component Value Date   PREALBUMIN 21.6 10/29/2021      Latest Ref Rng & Units 02/17/2023    2:49 AM 02/16/2023    3:36 AM 02/15/2023    3:18 AM  CBC EXTENDED  WBC 4.0 - 10.5 K/uL 8.3  6.5  7.5   RBC 4.22 - 5.81 MIL/uL 3.49  3.42  3.43   Hemoglobin 13.0 - 17.0 g/dL 9.3  9.3  9.1   HCT 27.2 - 52.0 % 29.1  29.0  28.8   Platelets 150 - 400 K/uL 236  222  226      There is no height or weight on file to calculate BMI.  Orders:  No orders of the defined types were placed in this encounter.  No orders of the defined  types were placed in this encounter.    Procedures: No procedures performed  Clinical Data: No additional findings.  ROS:  All other systems negative, except as noted in the HPI. Review of Systems  Constitutional: Negative.   Cardiovascular:  Positive for leg swelling.  Skin:  Positive for wound. Negative for color change.    Objective: Vital Signs: There were no vitals taken for this visit.  Specialty Comments:  No specialty comments available.  PMFS History: Patient Active Problem List   Diagnosis Date Noted   Hepatic steatosis 03/25/2023   Acute blood loss anemia 02/06/2023   GERD (gastroesophageal reflux disease) 01/24/2023   History of osteomyelitis 10/12/2022   Uncontrolled type 2 diabetes mellitus with hyperglycemia (HCC) 10/12/2022   CAD (coronary artery disease) 10/12/2022   Encounter for therapeutic drug monitoring 10/12/2022   S/P BKA (below knee amputation), left (HCC) 10/30/2021   Dehiscence of amputation stump of left lower extremity (HCC)    Anxiety 09/30/2020   Preventative health care  12/13/2019   S/P aortic valve replacement with mechanical valve 12/05/2019   Hyperlipidemia associated with type 2 diabetes mellitus (HCC) 10/12/2015   Class 3 obesity (HCC) 10/12/2015   HTN (hypertension) 07/02/2013   Past Medical History:  Diagnosis Date   Anxiety    Aortic stenosis    Cellulitis and abscess of lower extremity 06/11/2019   Cellulitis of fourth toe of left foot    Cholelithiasis    Coronary artery disease    Nonobstructive CAD (40-50% LAD) 08/2019   Depression    Diabetes mellitus without complication (HCC)    Phreesia 09/27/2020   Elevated troponin level not due myocardial infarction 11/11/2019   Essential hypertension    Gangrene of toe of left foot (HCC) 07/06/2019   Heart murmur    Phreesia 09/27/2020   Hyperlipidemia    Phreesia 09/27/2020   Hypertension    Phreesia 09/27/2020   Mixed hyperlipidemia    Morbid obesity (HCC)    S/P aortic valve replacement with mechanical valve 12/05/2019   25 mm Carbomedics top hat bileaflet mechanical valve via partial upper hemi-sternotomy   Severe aortic stenosis 09/24/2019   Type 2 diabetes mellitus (HCC)     Family History  Problem Relation Age of Onset   Cancer Mother        Brain tumor   Heart disease Father    Hyperlipidemia Father    Hypertension Father    Stroke Father    Heart murmur Sister    Heart attack Maternal Grandmother    Liver disease Maternal Grandfather    Breast cancer Paternal Grandmother    COPD Paternal Grandfather    Heart attack Maternal Aunt    Colon cancer Maternal Uncle 67   Pancreatic cancer Paternal Aunt    Heart disease Paternal Uncle    Prostate cancer Neg Hx     Past Surgical History:  Procedure Laterality Date   ABDOMINAL AORTOGRAM W/LOWER EXTREMITY N/A 07/06/2019   Procedure: ABDOMINAL AORTOGRAM W/LOWER EXTREMITY;  Surgeon: Sherren Kerns, MD;  Location: [facility] LAB;  Service: Cardiovascular;  Laterality: N/A;  Bilateral   AMPUTATION Left 07/09/2019    Procedure: LEFT FOURTH and Fifth TOE AMPUTATION.;  Surgeon: Larina Earthly, MD;  Location: [facility] OR;  Service: Vascular;  Laterality: Left;   AMPUTATION Left 03/11/2021   Procedure: LEFT FOOT 5TH  AND 4TH RAY AMPUTATION;  Surgeon: Nadara Mustard, MD;  Location: [facility] OR;  Service: Orthopedics;  Laterality: Left;   AMPUTATION Left  08/28/2021   Procedure: AMPUTATION BELOW KNEE;  Surgeon: Nadara Mustard, MD;  Location: [facility] OR;  Service: Orthopedics;  Laterality: Left;   AMPUTATION Left 01/29/2023   Procedure: LEFT AMPUTATION BELOW KNEE REVISION AND CLOSURE;  Surgeon: Nadara Mustard, MD;  Location: [facility] OR;  Service: Orthopedics;  Laterality: Left;   AORTIC VALVE REPLACEMENT N/A 12/05/2019   Procedure: PARTIAL STERNOTOMY FOR AORTIC VALVE REPLACEMENT (AVR), USING CARBOMEDICS SUPRA-ANNULAR TOP HAT ;  Surgeon: Purcell Nails, MD;  Location: [facility] OR;  Service: Open Heart Surgery;  Laterality: N/A;  No neck lines on left   APPLICATION OF WOUND VAC Left 10/30/2021   Procedure: APPLICATION OF WOUND VAC;  Surgeon: Nadara Mustard, MD;  Location: [facility] OR;  Service: Orthopedics;  Laterality: Left;   APPLICATION OF WOUND VAC Left 12/25/2021   Procedure: APPLICATION OF WOUND VAC;  Surgeon: Nadara Mustard, MD;  Location: [facility] OR;  Service: Orthopedics;  Laterality: Left;   APPLICATION OF WOUND VAC Left 01/26/2023   Procedure: APPLICATION OF WOUND VAC TO LEFT STUMP;  Surgeon: Tarry Kos, MD;  Location: [facility] OR;  Service: Orthopedics;  Laterality: Left;   CARDIAC VALVE REPLACEMENT N/A    Phreesia 09/27/2020   I & D EXTREMITY Left 01/26/2023   Procedure: IRRIGATION AND DEBRIDEMENT OF LEFT BKA STUMP;  Surgeon: Tarry Kos, MD;  Location: [facility] OR;  Service: Orthopedics;  Laterality: Left;   IR RADIOLOGY PERIPHERAL GUIDED IV START  10/05/2019   IR US GUIDE VASC ACCESS RIGHT  10/05/2019   MULTIPLE EXTRACTIONS WITH ALVEOLOPLASTY N/A 10/26/2019   Procedure: EXTRACTION OF TOOTH #'S 3, 5-11,19-28,  AND 32 WITH ALVEOLOPLASTY;  Surgeon: Charlynne Pander, DDS;  Location: [facility] OR;  Service: Oral Surgery;  Laterality: N/A;   RIGHT HEART CATH AND CORONARY ANGIOGRAPHY N/A 09/24/2019   Procedure: RIGHT HEART CATH AND CORONARY ANGIOGRAPHY;  Surgeon: Tonny Bollman, MD;  Location: [facility] INVASIVE CV LAB;  Service: Cardiovascular;  Laterality: N/A;   STUMP REVISION Left 10/30/2021   Procedure: REVISION LEFT BELOW KNEE AMPUTATION;  Surgeon: Nadara Mustard, MD;  Location: [facility] OR;  Service: Orthopedics;  Laterality: Left;   STUMP REVISION Left 12/25/2021   Procedure: REVISION LEFT BELOW KNEE AMPUTATION;  Surgeon: Nadara Mustard, MD;  Location: [facility] OR;  Service: Orthopedics;  Laterality: Left;   TEE WITHOUT CARDIOVERSION N/A 12/05/2019   Procedure: TRANSESOPHAGEAL ECHOCARDIOGRAM (TEE);  Surgeon: Purcell Nails, MD;  Location: [facility] OR;  Service: Open Heart Surgery;  Laterality: N/A;   TEE WITHOUT CARDIOVERSION N/A 09/01/2021   Procedure: TRANSESOPHAGEAL ECHOCARDIOGRAM (TEE);  Surgeon: Little Ishikawa, MD;  Location: [facility] ENDOSCOPY;  Service: Cardiovascular;  Laterality: N/A;   Social History   Occupational History    Comment: Glass blower/designer- self-employed  Tobacco Use   Smoking status: Never   Smokeless tobacco: Former    Types: Chew    Quit date: 2021  Vaping Use   Vaping status: Never Used  Substance and Sexual Activity   Alcohol use: Yes    Alcohol/week: 4.0 standard drinks of alcohol    Types: 4 Cans of beer per week    Comment: occasionally   Drug use: No   Sexual activity: Yes    Birth control/protection: None

## 2023-06-19 ENCOUNTER — Other Ambulatory Visit: Payer: Self-pay | Admitting: Internal Medicine

## 2023-06-21 ENCOUNTER — Ambulatory Visit (INDEPENDENT_AMBULATORY_CARE_PROVIDER_SITE_OTHER): Payer: Medicaid Other | Admitting: Family

## 2023-06-21 ENCOUNTER — Encounter: Payer: Self-pay | Admitting: Family

## 2023-06-21 DIAGNOSIS — L98491 Non-pressure chronic ulcer of skin of other sites limited to breakdown of skin: Secondary | ICD-10-CM

## 2023-06-21 DIAGNOSIS — I87331 Chronic venous hypertension (idiopathic) with ulcer and inflammation of right lower extremity: Secondary | ICD-10-CM

## 2023-06-21 NOTE — Progress Notes (Signed)
Office Visit Note   Patient: Adam Long           Date of Birth: 01/02/1976           MRN: [id number] Visit Date: 06/21/2023              Requested by: [name], DO [address] [place],  [place] [address] PCP: [name], DO  Chief Complaint  Patient presents with   Right Leg - Wound Check    Registry #20 visit # 5      HPI: The patient is a 47 year old gentleman who is seen in follow-up for venous traumatic ulcer to the right lower extremity has been compression wrap with Unna and Dynaflex silver cell to the wound for the last 1 week  Was measured for CircAid wraps at last visit   Assessment & Plan: Visit Diagnoses: No diagnosis found.  Plan: Placed in a CircAid wrap today.  He will follow-up in the office in 2 weeks.  Instructed on proper use    Follow-Up Instructions: Return in about 1 week (around 06/28/2023).   Ortho Exam  Patient is alert, oriented, no adenopathy, well-dressed, normal affect, normal respiratory effort. On examination right lower extremity the ulcer is fully healed there is no gaping drainage no open area no weeping no warmth. Imaging: No results found.     Labs: Lab Results  Component Value Date   HGBA1C 13.4 (H) 01/24/2023   HGBA1C 8.6 (H) 04/27/2022   HGBA1C 6.4 (H) 10/30/2021   ESRSEDRATE 60 (H) 09/14/2021   ESRSEDRATE 65 (H) 09/07/2021   ESRSEDRATE 104 (H) 08/29/2021   CRP 1.5 (H) 09/14/2021   CRP 3.4 (H) 09/07/2021   CRP 12.8 (H) 08/29/2021   LABURIC 7.9 02/07/2023   REPTSTATUS 02/13/2023 FINAL 02/08/2023   GRAMSTAIN  01/26/2023    RARE WBC PRESENT, PREDOMINANTLY PMN FEW GRAM POSITIVE COCCI IN PAIRS FEW GRAM POSITIVE COCCI IN CLUSTERS    CULT  02/08/2023    NO GROWTH 5 DAYS Performed at [facility], [address], [place], [place] [address]    LABORGA METHICILLIN RESISTANT STAPHYLOCOCCUS AUREUS 01/26/2023     Lab Results  Component Value Date   ALBUMIN 2.4 (L) 02/06/2023   ALBUMIN 1.9 (L)  01/26/2023   ALBUMIN 1.9 (L) 01/24/2023   PREALBUMIN 21.6 10/29/2021    Lab Results  Component Value Date   MG 2.0 02/11/2023   MG 2.1 02/10/2023   MG 2.1 02/09/2023   Lab Results  Component Value Date   VD25OH 15.4 (L) 04/27/2022   VD25OH 38 06/13/2020    Lab Results  Component Value Date   PREALBUMIN 21.6 10/29/2021      Latest Ref Rng & Units 02/17/2023    2:49 AM 02/16/2023    3:36 AM 02/15/2023    3:18 AM  CBC EXTENDED  WBC 4.0 - 10.5 K/uL 8.3  6.5  7.5   RBC 4.22 - 5.81 MIL/uL 3.49  3.42  3.43   Hemoglobin 13.0 - 17.0 g/dL 9.3  9.3  9.1   HCT 86.5 - 52.0 % 29.1  29.0  28.8   Platelets 150 - 400 K/uL 236  222  226      There is no height or weight on file to calculate BMI.  Orders:  No orders of the defined types were placed in this encounter.  No orders of the defined types were placed in this encounter.    Procedures: No procedures  performed  Clinical Data: No additional findings.  ROS:  All other systems negative, except as noted in the HPI. Review of Systems  Constitutional: Negative.   Cardiovascular:  Positive for leg swelling.  Skin:  Positive for wound. Negative for color change.    Objective: Vital Signs: There were no vitals taken for this visit.  Specialty Comments:  No specialty comments available.  PMFS History: Patient Active Problem List   Diagnosis Date Noted   Hepatic steatosis 03/25/2023   Acute blood loss anemia 02/06/2023   GERD (gastroesophageal reflux disease) 01/24/2023   History of osteomyelitis 10/12/2022   Uncontrolled type 2 diabetes mellitus with hyperglycemia (HCC) 10/12/2022   CAD (coronary artery disease) 10/12/2022   Encounter for therapeutic drug monitoring 10/12/2022   S/P BKA (below knee amputation), left (HCC) 10/30/2021   Dehiscence of amputation stump of left lower extremity (HCC)    Anxiety 09/30/2020   Preventative health care 12/13/2019   S/P aortic valve replacement with mechanical valve  12/05/2019   Hyperlipidemia associated with type 2 diabetes mellitus (HCC) 10/12/2015   Class 3 obesity (HCC) 10/12/2015   HTN (hypertension) 07/02/2013   Past Medical History:  Diagnosis Date   Anxiety    Aortic stenosis    Cellulitis and abscess of lower extremity 06/11/2019   Cellulitis of fourth toe of left foot    Cholelithiasis    Coronary artery disease    Nonobstructive CAD (40-50% LAD) 08/2019   Depression    Diabetes mellitus without complication (HCC)    Phreesia 09/27/2020   Elevated troponin level not due myocardial infarction 11/11/2019   Essential hypertension    Gangrene of toe of left foot (HCC) 07/06/2019   Heart murmur    Phreesia 09/27/2020   Hyperlipidemia    Phreesia 09/27/2020   Hypertension    Phreesia 09/27/2020   Mixed hyperlipidemia    Morbid obesity (HCC)    S/P aortic valve replacement with mechanical valve 12/05/2019   25 mm Carbomedics top hat bileaflet mechanical valve via partial upper hemi-sternotomy   Severe aortic stenosis 09/24/2019   Type 2 diabetes mellitus (HCC)     Family History  Problem Relation Age of Onset   Cancer Mother        Brain tumor   Heart disease Father    Hyperlipidemia Father    Hypertension Father    Stroke Father    Heart murmur Sister    Heart attack Maternal Grandmother    Liver disease Maternal Grandfather    Breast cancer Paternal Grandmother    COPD Paternal Grandfather    Heart attack Maternal Aunt    Colon cancer Maternal Uncle 67   Pancreatic cancer Paternal Aunt    Heart disease Paternal Uncle    Prostate cancer Neg Hx     Past Surgical History:  Procedure Laterality Date   ABDOMINAL AORTOGRAM W/LOWER EXTREMITY N/A 07/06/2019   Procedure: ABDOMINAL AORTOGRAM W/LOWER EXTREMITY;  Surgeon: Sherren Kerns, MD;  Location: [facility] INVASIVE CV LAB;  Service: Cardiovascular;  Laterality: N/A;  Bilateral   AMPUTATION Left 07/09/2019   Procedure: LEFT FOURTH and Fifth TOE AMPUTATION.;  Surgeon: Larina Earthly, MD;  Location: [facility] OR;  Service: Vascular;  Laterality: Left;   AMPUTATION Left 03/11/2021   Procedure: LEFT FOOT 5TH  AND 4TH RAY AMPUTATION;  Surgeon: Nadara Mustard, MD;  Location: [facility] OR;  Service: Orthopedics;  Laterality: Left;   AMPUTATION Left 08/28/2021   Procedure: AMPUTATION BELOW KNEE;  Surgeon: Nadara Mustard,  MD;  Location: [facility] OR;  Service: Orthopedics;  Laterality: Left;   AMPUTATION Left 01/29/2023   Procedure: LEFT AMPUTATION BELOW KNEE REVISION AND CLOSURE;  Surgeon: Nadara Mustard, MD;  Location: [facility] OR;  Service: Orthopedics;  Laterality: Left;   AORTIC VALVE REPLACEMENT N/A 12/05/2019   Procedure: PARTIAL STERNOTOMY FOR AORTIC VALVE REPLACEMENT (AVR), USING CARBOMEDICS SUPRA-ANNULAR TOP HAT ;  Surgeon: Purcell Nails, MD;  Location: [facility] OR;  Service: Open Heart Surgery;  Laterality: N/A;  No neck lines on left   APPLICATION OF WOUND VAC Left 10/30/2021   Procedure: APPLICATION OF WOUND VAC;  Surgeon: Nadara Mustard, MD;  Location: [facility] OR;  Service: Orthopedics;  Laterality: Left;   APPLICATION OF WOUND VAC Left 12/25/2021   Procedure: APPLICATION OF WOUND VAC;  Surgeon: Nadara Mustard, MD;  Location: [facility] OR;  Service: Orthopedics;  Laterality: Left;   APPLICATION OF WOUND VAC Left 01/26/2023   Procedure: APPLICATION OF WOUND VAC TO LEFT STUMP;  Surgeon: Tarry Kos, MD;  Location: [facility] OR;  Service: Orthopedics;  Laterality: Left;   CARDIAC VALVE REPLACEMENT N/A    Phreesia 09/27/2020   I & D EXTREMITY Left 01/26/2023   Procedure: IRRIGATION AND DEBRIDEMENT OF LEFT BKA STUMP;  Surgeon: Tarry Kos, MD;  Location: [facility] OR;  Service: Orthopedics;  Laterality: Left;   IR RADIOLOGY PERIPHERAL GUIDED IV START  10/05/2019   IR US GUIDE VASC ACCESS RIGHT  10/05/2019   MULTIPLE EXTRACTIONS WITH ALVEOLOPLASTY N/A 10/26/2019   Procedure: EXTRACTION OF TOOTH #'S 3, 5-11,19-28,  AND 32 WITH ALVEOLOPLASTY;  Surgeon: Charlynne Pander, DDS;  Location: [facility] OR;  Service: Oral Surgery;  Laterality: N/A;    RIGHT HEART CATH AND CORONARY ANGIOGRAPHY N/A 09/24/2019   Procedure: RIGHT HEART CATH AND CORONARY ANGIOGRAPHY;  Surgeon: Tonny Bollman, MD;  Location: [facility] INVASIVE CV LAB;  Service: Cardiovascular;  Laterality: N/A;   STUMP REVISION Left 10/30/2021   Procedure: REVISION LEFT BELOW KNEE AMPUTATION;  Surgeon: Nadara Mustard, MD;  Location: [facility] OR;  Service: Orthopedics;  Laterality: Left;   STUMP REVISION Left 12/25/2021   Procedure: REVISION LEFT BELOW KNEE AMPUTATION;  Surgeon: Nadara Mustard, MD;  Location: [facility] OR;  Service: Orthopedics;  Laterality: Left;   TEE WITHOUT CARDIOVERSION N/A 12/05/2019   Procedure: TRANSESOPHAGEAL ECHOCARDIOGRAM (TEE);  Surgeon: Purcell Nails, MD;  Location: [facility] OR;  Service: Open Heart Surgery;  Laterality: N/A;   TEE WITHOUT CARDIOVERSION N/A 09/01/2021   Procedure: TRANSESOPHAGEAL ECHOCARDIOGRAM (TEE);  Surgeon: Little Ishikawa, MD;  Location: [facility] ENDOSCOPY;  Service: Cardiovascular;  Laterality: N/A;   Social History   Occupational History    Comment: Glass blower/designer- self-employed  Tobacco Use   Smoking status: Never   Smokeless tobacco: Former    Types: Chew    Quit date: 2021  Vaping Use   Vaping status: Never Used  Substance and Sexual Activity   Alcohol use: Yes    Alcohol/week: 4.0 standard drinks of alcohol    Types: 4 Cans of beer per week    Comment: occasionally   Drug use: No   Sexual activity: Yes    Birth control/protection: None

## 2023-06-27 ENCOUNTER — Emergency Department (HOSPITAL_COMMUNITY)
Admission: EM | Admit: 2023-06-27 | Discharge: 2023-06-28 | Disposition: A | Discharge status: Home / Self Care | Payer: Medicaid Other | Attending: Emergency Medicine | Admitting: Emergency Medicine

## 2023-06-27 ENCOUNTER — Other Ambulatory Visit: Payer: Self-pay

## 2023-06-27 ENCOUNTER — Encounter (HOSPITAL_COMMUNITY): Payer: Self-pay | Admitting: Emergency Medicine

## 2023-06-27 DIAGNOSIS — Z7901 Long term (current) use of anticoagulants: Secondary | ICD-10-CM | POA: Insufficient documentation

## 2023-06-27 DIAGNOSIS — Z794 Long term (current) use of insulin: Secondary | ICD-10-CM | POA: Insufficient documentation

## 2023-06-27 DIAGNOSIS — Z79899 Other long term (current) drug therapy: Secondary | ICD-10-CM | POA: Diagnosis not present

## 2023-06-27 DIAGNOSIS — D649 Anemia, unspecified: Secondary | ICD-10-CM | POA: Diagnosis not present

## 2023-06-27 DIAGNOSIS — E1165 Type 2 diabetes mellitus with hyperglycemia: Secondary | ICD-10-CM | POA: Insufficient documentation

## 2023-06-27 DIAGNOSIS — I2510 Atherosclerotic heart disease of native coronary artery without angina pectoris: Secondary | ICD-10-CM | POA: Insufficient documentation

## 2023-06-27 DIAGNOSIS — R109 Unspecified abdominal pain: Secondary | ICD-10-CM | POA: Insufficient documentation

## 2023-06-27 DIAGNOSIS — E860 Dehydration: Secondary | ICD-10-CM | POA: Diagnosis not present

## 2023-06-27 DIAGNOSIS — I10 Essential (primary) hypertension: Secondary | ICD-10-CM | POA: Insufficient documentation

## 2023-06-27 DIAGNOSIS — Z7984 Long term (current) use of oral hypoglycemic drugs: Secondary | ICD-10-CM | POA: Insufficient documentation

## 2023-06-27 DIAGNOSIS — R197 Diarrhea, unspecified: Secondary | ICD-10-CM | POA: Insufficient documentation

## 2023-06-27 DIAGNOSIS — R112 Nausea with vomiting, unspecified: Secondary | ICD-10-CM | POA: Insufficient documentation

## 2023-06-27 LAB — COMPREHENSIVE METABOLIC PANEL
ALT: 27 U/L (ref 0–44)
AST: 29 U/L (ref 15–41)
Albumin: 3.3 g/dL — ABNORMAL LOW (ref 3.5–5.0)
Alkaline Phosphatase: 42 U/L (ref 38–126)
Anion gap: 13 (ref 5–15)
BUN: 13 mg/dL (ref 6–20)
CO2: 22 mmol/L (ref 22–32)
Calcium: 8.6 mg/dL — ABNORMAL LOW (ref 8.9–10.3)
Chloride: 97 mmol/L — ABNORMAL LOW (ref 98–111)
Creatinine, Ser: 1 mg/dL (ref 0.61–1.24)
GFR, Estimated: >60 mL/min (ref >60)
Glucose, Bld: 213 mg/dL — ABNORMAL HIGH (ref 70–99)
Potassium: 4.2 mmol/L (ref 3.5–5.1)
Sodium: 132 mmol/L — ABNORMAL LOW (ref 135–145)
Total Bilirubin: 0.6 mg/dL (ref 0.3–1.2)
Total Protein: 7.1 g/dL (ref 6.5–8.1)

## 2023-06-27 LAB — CBC
HCT: 28.8 % — ABNORMAL LOW (ref 39.0–52.0)
Hemoglobin: 8.8 g/dL — ABNORMAL LOW (ref 13.0–17.0)
MCH: 21.9 pg — ABNORMAL LOW (ref 26.0–34.0)
MCHC: 30.6 g/dL (ref 30.0–36.0)
MCV: 71.8 fL — ABNORMAL LOW (ref 80.0–100.0)
Platelets: 239 10*3/uL (ref 150–400)
RBC: 4.01 MIL/uL — ABNORMAL LOW (ref 4.22–5.81)
RDW: 19.1 % — ABNORMAL HIGH (ref 11.5–15.5)
WBC: 11.9 10*3/uL — ABNORMAL HIGH (ref 4.0–10.5)
nRBC: 0 % (ref 0.0–0.2)

## 2023-06-27 LAB — LIPASE, BLOOD: Lipase: 24 U/L (ref 11–51)

## 2023-06-27 NOTE — ED Triage Notes (Signed)
Pt brought in by EMS for c/o L sided upper and lower abdominal pain, nausea, vomiting, and diarrhea x 5 hrs. Reports 5-6 episodes of vomiting. EMS gave pt 4mg  Zofran IV PTA.

## 2023-06-28 ENCOUNTER — Ambulatory Visit: Payer: Medicaid Other | Admitting: Family Medicine

## 2023-06-28 ENCOUNTER — Emergency Department (HOSPITAL_COMMUNITY): Payer: Medicaid Other

## 2023-06-28 LAB — PROTIME-INR
INR: 2.3 — ABNORMAL HIGH (ref 0.8–1.2)
Prothrombin Time: 25.6 s — ABNORMAL HIGH (ref 11.4–15.2)

## 2023-06-28 LAB — URINALYSIS, ROUTINE W REFLEX MICROSCOPIC
Bilirubin Urine: NEGATIVE
Glucose, UA: NEGATIVE mg/dL
Ketones, ur: 5 mg/dL — AB
Nitrite: POSITIVE — AB
Protein, ur: 100 mg/dL — AB
RBC / HPF: >50 RBC/hpf (ref 0–5)
Specific Gravity, Urine: 1.013 (ref 1.005–1.030)
pH: 6 (ref 5.0–8.0)

## 2023-06-28 MED ORDER — SODIUM CHLORIDE 0.9 % IV BOLUS
1000.0000 mL | Freq: Once | INTRAVENOUS | Status: AC
  Administered 2023-06-28: 1000 mL via INTRAVENOUS

## 2023-06-28 MED ORDER — ONDANSETRON HCL 4 MG/2ML IJ SOLN
4.0000 mg | Freq: Once | INTRAMUSCULAR | Status: AC
  Administered 2023-06-28: 4 mg via INTRAVENOUS
  Filled 2023-06-28: qty 2

## 2023-06-28 MED ORDER — IOHEXOL 300 MG/ML  SOLN
100.0000 mL | Freq: Once | INTRAMUSCULAR | Status: AC | PRN
  Administered 2023-06-28: 100 mL via INTRAVENOUS

## 2023-06-28 MED ORDER — FENTANYL CITRATE PF 50 MCG/ML IJ SOSY
50.0000 ug | PREFILLED_SYRINGE | Freq: Once | INTRAMUSCULAR | Status: AC
  Administered 2023-06-28: 50 ug via INTRAVENOUS
  Filled 2023-06-28: qty 1

## 2023-06-28 NOTE — ED Provider Notes (Signed)
Adam Long EMERGENCY DEPARTMENT AT [facility] Provider Note   CSN: [id number] Arrival date & time: 06/27/23  2045     History  Chief Complaint  Patient presents with   Abdominal Pain    Adam Long is a 47 y.o. male.  The history is provided by the patient.  Patient with a history of diabetes, obesity, hypertension, previous amputation, AVR currently on Coumadin presents with abdominal pain.  He reports he had abrupt onset of left-sided abdominal pain with nausea vomiting diarrhea.  He reports multiple episodes of both that were nonbloody He had otherwise been at his baseline.  No recent antibiotics. No recent travel Patient reports he did miss his recent dose of Coumadin   Past Medical History:  Diagnosis Date   Anxiety    Aortic stenosis    Cellulitis and abscess of lower extremity 06/11/2019   Cellulitis of fourth toe of left foot    Cholelithiasis    Coronary artery disease    Nonobstructive CAD (40-50% LAD) 08/2019   Depression    Diabetes mellitus without complication (HCC)    Phreesia 09/27/2020   Elevated troponin level not due myocardial infarction 11/11/2019   Essential hypertension    Gangrene of toe of left foot (HCC) 07/06/2019   Heart murmur    Phreesia 09/27/2020   Hyperlipidemia    Phreesia 09/27/2020   Hypertension    Phreesia 09/27/2020   Mixed hyperlipidemia    Morbid obesity (HCC)    S/P aortic valve replacement with mechanical valve 12/05/2019   25 mm Carbomedics top hat bileaflet mechanical valve via partial upper hemi-sternotomy   Severe aortic stenosis 09/24/2019   Type 2 diabetes mellitus (HCC)     Home Medications Prior to Admission medications   Medication Sig Start Date End Date Taking? Authorizing Provider  Continuous Glucose Sensor (FREESTYLE LIBRE 3 SENSOR) MISC Place 1 sensor on the skin every 14 days. Use to check glucose continuously 02/28/23   [name], DO  ezetimibe (ZETIA) 10 MG tablet Take 1 tablet (10 mg  total) by mouth daily. 10/12/22   [name], DO  gabapentin (NEURONTIN) 300 MG capsule Take 2 capsules (600 mg total) by mouth 2 (two) times daily. 02/25/23   [name], DO  insulin glargine (LANTUS) 100 UNIT/ML Solostar Pen Inject 30 Units into the skin daily. 02/04/23   Rhetta Mura, MD  Insulin Pen Needle 32G X 4 MM MISC Use with Lantus flexpen 02/03/23   Rhetta Mura, MD  lisinopril (ZESTRIL) 20 MG tablet Take 1 tablet (20 mg total) by mouth daily. 10/12/22   [name], DO  metFORMIN (GLUCOPHAGE) 500 MG tablet Take 2 tablets (1,000 mg total) by mouth 2 (two) times daily with a meal. 10/12/22   Cook, Verdis Frederickson, DO  methocarbamol (ROBAXIN) 500 MG tablet Take 1 tablet (500 mg total) by mouth every 6 (six) hours as needed for muscle spasms. 02/03/23   Rhetta Mura, MD  metoprolol tartrate (LOPRESSOR) 25 MG tablet Take 1 tablet (25 mg total) by mouth 2 (two) times daily. 10/12/22   [name], DO  oxyCODONE (OXY IR/ROXICODONE) 5 MG immediate release tablet Take 1-2 tablets (5-10 mg total) by mouth every 4 (four) hours as needed for moderate pain (pain score 4-6). 02/03/23   Rhetta Mura, MD  pantoprazole (PROTONIX) 40 MG tablet Take 1 tablet (40 mg total) by mouth daily. 10/12/22   [name], DO  sertraline (ZOLOFT) 50 MG tablet Take 1  tablet (50 mg total) by mouth daily. 10/12/22   [name], DO  warfarin (COUMADIN) 10 MG tablet TAKE 1 TABLET BY MOUTH DAILY EXCEPT TAKE  ONE-HALF TABLET  ON MONDAYS, WEDNESDAYS AND FRIDAYS OR AS DIRECTED 02/28/23   Jonelle Sidle, MD  Zinc Sulfate 220 (50 Zn) MG TABS Take 1 tablet (220 mg total) by mouth daily. 02/04/23   Rhetta Mura, MD      Allergies    Patient has no known allergies.    Review of Systems   Review of Systems  Constitutional:  Negative for fever.  Gastrointestinal:  Positive for abdominal pain, diarrhea and vomiting. Negative for blood in stool.    Physical Exam Updated Vital Signs BP (!)  145/79   Pulse 82   Temp 99.3 F (37.4 C) (Oral)   Resp 18   Ht 1.778 m (5\' 10" )   Wt (!) 165.6 kg   SpO2 97%   BMI 52.37 kg/m  Physical Exam CONSTITUTIONAL: Chronically ill-appearing, appears older than stated age HEAD: Normocephalic/atraumatic EYES: EOMI/PERRL ENMT: Mucous membranes moist NECK: supple no meningeal signs LUNGS: Lungs are clear to auscultation bilaterally, no apparent distress ABDOMEN: soft, obese, diffuse left upper and left lower quadrant tenderness GU: Exam limited due to body habitus.  Chaperoned by nurse Jeanella Anton No obvious tenderness noted to the scrotum.  No erythema.  Mild candidal intertrigo is noted to his abdominal wall NEURO: Pt is awake/alert/appropriate, moves all extremitiesx4.  No facial droop.   SKIN: warm, color normal PSYCH: no abnormalities of mood noted, alert and oriented to situation  ED Results / Procedures / Treatments   Labs (all labs ordered are listed, but only abnormal results are displayed) Labs Reviewed  COMPREHENSIVE METABOLIC PANEL - Abnormal; Notable for the following components:      Result Value   Sodium 132 (*)    Chloride 97 (*)    Glucose, Bld 213 (*)    Calcium 8.6 (*)    Albumin 3.3 (*)    All other components within normal limits  CBC - Abnormal; Notable for the following components:   WBC 11.9 (*)    RBC 4.01 (*)    Hemoglobin 8.8 (*)    HCT 28.8 (*)    MCV 71.8 (*)    MCH 21.9 (*)    RDW 19.1 (*)    All other components within normal limits  URINALYSIS, ROUTINE W REFLEX MICROSCOPIC - Abnormal; Notable for the following components:   APPearance CLOUDY (*)    Hgb urine dipstick LARGE (*)    Ketones, ur 5 (*)    Protein, ur 100 (*)    Nitrite POSITIVE (*)    Leukocytes,Ua TRACE (*)    Bacteria, UA MANY (*)    All other components within normal limits  PROTIME-INR - Abnormal; Notable for the following components:   Prothrombin Time 25.6 (*)    INR 2.3 (*)    All other components within normal limits  URINE  CULTURE  LIPASE, BLOOD    EKG None  Radiology CT ABDOMEN PELVIS W CONTRAST  Result Date: 06/28/2023 CLINICAL DATA:  Acute nonlocalized abdominal pain. Left-sided upper and lower abdominal pain with nausea, vomiting, and diarrhea for 5 hours. EXAM: CT ABDOMEN AND PELVIS WITH CONTRAST TECHNIQUE: Multidetector CT imaging of the abdomen and pelvis was performed using the standard protocol following bolus administration of intravenous contrast. RADIATION DOSE REDUCTION: This exam was performed according to the departmental dose-optimization program which includes automated exposure control, adjustment of  the mA and/or kV according to patient size and/or use of iterative reconstruction technique. CONTRAST:  OMNIPAQUE IOHEXOL 300 MG/ML  SOLN COMPARISON:  10/05/2019 FINDINGS: Lower chest: Lung bases are clear. Hepatobiliary: Diffuse fatty infiltration of the liver. No focal lesions. Cholelithiasis with multiple stones in the gallbladder. Gallbladder is contracted. No bile duct dilatation. Pancreas: Unremarkable. No pancreatic ductal dilatation or surrounding inflammatory changes. Spleen: Normal in size without focal abnormality. Adrenals/Urinary Tract: No adrenal gland nodules. Calcific densities in the kidneys likely representing vascular calcifications. Probable left intrarenal stone in the lower pole measuring 5 mm diameter. No hydronephrosis or hydroureter. Bladder is normal. Stomach/Bowel: Stomach, small bowel, and colon are mostly decompressed. No wall thickening or inflammatory changes are seen. Appendix is not identified. Vascular/Lymphatic: Aortic atherosclerosis. No enlarged abdominal or pelvic lymph nodes. Reproductive: Prostate is unremarkable. Other: No abdominal wall hernia or abnormality. No abdominopelvic ascites. Musculoskeletal: Degenerative changes in the spine. No acute bony abnormalities. IMPRESSION: 1. No evidence of bowel obstruction or inflammation. 2. Cholelithiasis. 3.  Nonobstructing left renal stones. 4. Fatty infiltration of the liver. 5. Aortic atherosclerosis. Electronically Signed   By: Burman Nieves M.D.   On: 06/28/2023 03:29    Procedures Procedures    Medications Ordered in ED Medications  fentaNYL (SUBLIMAZE) injection 50 mcg (50 mcg Intravenous Given 06/28/23 0240)  ondansetron (ZOFRAN) injection 4 mg (4 mg Intravenous Given 06/28/23 0240)  sodium chloride 0.9 % bolus 1,000 mL (1,000 mLs Intravenous New Bag/Given 06/28/23 0240)  iohexol (OMNIPAQUE) 300 MG/ML solution 100 mL (100 mLs Intravenous Contrast Given 06/28/23 0257)    ED Course/ Medical Decision Making/ A&P Clinical Course as of 06/28/23 0423  Tue Jun 28, 2023  0318 Glucose(!): 213 Hyperglycemia [DW]  0318 Hemoglobin(!): 8.8 Chronic anemia [DW]  0354 No acute findings on CT imaging.  Given the vomiting and diarrhea, suspicion this is gastroenteritis.  Patient is feeling improved, taking oral fluids.   [DW]  7425 Patient denies dysuria, and history not consistent with UTI.  Will send urine culture [DW]  0355 Of note, patient is supposed to between 2 and 3 for INR per his anticoagulation visits. He is currently 2.3.  He will go back to his usual dosing [DW]  0423 Overall patient is improved.  He is taking oral fluids.  No acute intra-abdominal emergency is noted.  Likely viral illness.  He is safe for discharge [DW]    Clinical Course User Index [DW] Zadie Rhine, MD                                 Medical Decision Making Amount and/or Complexity of Data Reviewed Labs: ordered. Decision-making details documented in ED Course. Radiology: ordered.  Risk Prescription drug management.   This patient presents to the ED for concern of abdominal pain, this involves an extensive number of treatment options, and is a complaint that carries with it a high risk of complications and morbidity.  The differential diagnosis includes but is not limited to cholecystitis,  cholelithiasis, pancreatitis, gastritis, peptic ulcer disease, appendicitis, bowel obstruction, bowel perforation, diverticulitis, AAA, ischemic bowel    Comorbidities that complicate the patient evaluation: Patient's presentation is complicated by their history of aortic stenosis, aortic valve repair, diabetes  Social Determinants of Health: Patient's  obesity   increases the complexity of managing their presentation  Additional history obtained: Records reviewed  outpatient records reviewed  Lab Tests: I Ordered, and personally interpreted labs.  The pertinent results include: Chronic anemia, hyperglycemia  Imaging Studies ordered: I ordered imaging studies including CT scan abdomen pelvis   I independently visualized and interpreted imaging which showed no acute findings I agree with the radiologist interpretation   Medicines ordered and prescription drug management: I ordered medication including fentanyl for pain Reevaluation of the patient after these medicines showed that the patient    improved   Reevaluation: After the interventions noted above, I reevaluated the patient and found that they have :improved  Complexity of problems addressed: Patient's presentation is most consistent with  acute presentation with potential threat to life or bodily function  Disposition: After consideration of the diagnostic results and the patient's response to treatment,  I feel that the patent would benefit from discharge   .           Final Clinical Impression(s) / ED Diagnoses Final diagnoses:  Nausea vomiting and diarrhea  Dehydration    Rx / DC Orders ED Discharge Orders     None         Zadie Rhine, MD 06/28/23 [phone]

## 2023-06-28 NOTE — ED Notes (Signed)
Water provided to pt.

## 2023-06-28 NOTE — Discharge Instructions (Addendum)
Be sure to hold your metformin (glucophage) for 48 hours

## 2023-06-29 ENCOUNTER — Ambulatory Visit: Payer: Medicaid Other | Attending: Cardiology | Admitting: *Deleted

## 2023-06-29 DIAGNOSIS — Z952 Presence of prosthetic heart valve: Secondary | ICD-10-CM

## 2023-06-29 DIAGNOSIS — Z5181 Encounter for therapeutic drug level monitoring: Secondary | ICD-10-CM

## 2023-06-29 LAB — POCT INR: INR: 1.7 — AB (ref 2.0–3.0)

## 2023-06-29 NOTE — Patient Instructions (Signed)
Continue warfarin 1 tablet daily expcet for 1/2 tablet on Monday, Wednesday and Friday. Recheck in 3 wks.

## 2023-06-30 LAB — URINE CULTURE: Culture: >=100000 — AB

## 2023-07-01 ENCOUNTER — Telehealth (HOSPITAL_BASED_OUTPATIENT_CLINIC_OR_DEPARTMENT_OTHER): Payer: Self-pay | Admitting: *Deleted

## 2023-07-01 NOTE — Telephone Encounter (Addendum)
Post ED Visit - Positive Culture Follow-up  Culture report reviewed by antimicrobial stewardship pharmacist: [facility] []  Enzo Bi, Pharm.D. []  Celedonio Miyamoto, [address].D., BCPS AQ-ID []  Garvin Fila, Pharm.D., BCPS []  Georgina Pillion, [address].D., BCPS []  [place], [place].D., BCPS, AAHIVP []  Estella Husk, Pharm.D., BCPS, AAHIVP [x]  Anda Kraft, PharmD, BCPS []  Phillips Climes, PharmD, BCPS []  Agapito Games, PharmD, BCPS []  Verlan Friends, PharmD []  Mervyn Gay, PharmD, BCPS []  Vinnie Level, PharmD  [facility] []  Len Childs, PharmD []  Greer Pickerel, PharmD []  Adalberto Cole, PharmD []  Perlie Gold, Rph []  Lonell Face) Jean Rosenthal, PharmD []  Earl Many, PharmD []  Junita Push, PharmD []  Dorna Leitz, PharmD []  Terrilee Files, PharmD []  Lynann Beaver, PharmD []  Keturah Barre, PharmD []  Loralee Pacas, PharmD []  Bernadene Person, PharmD   Positive Urine culture Symptoms not attributable to UTI, no abx needed per [facility] and no further patient follow-up is required at this time.  Nena Polio Garner Nash 07/01/2023, 8:32 AM

## 2023-07-01 NOTE — Progress Notes (Signed)
ED Antimicrobial Stewardship Positive Culture Follow Up   Adam Long is an 47 y.o. male who presented to [facility] on 06/27/2023 with a chief complaint of L upper and lower abdominal pain, N/V, diarrhea Chief Complaint  Patient presents with   Abdominal Pain    Recent Results (from the past 720 hour(s))  Urine Culture     Status: Abnormal   Collection Time: 06/28/23 12:12 AM   Specimen: Urine, Clean Catch  Result Value Ref Range Status   Specimen Description   Final    URINE, CLEAN CATCH Performed at [facility], [address] [place], [place] [address]    Special Requests   Final    NONE Performed at [facility], [address], [place], [place] [address]    Culture >=100,000 COLONIES/mL KLEBSIELLA PNEUMONIAE (A)  Final   Report Status 06/30/2023 FINAL  Final   Organism ID, Bacteria KLEBSIELLA PNEUMONIAE (A)  Final      Susceptibility   Klebsiella pneumoniae - MIC*    AMPICILLIN RESISTANT Resistant     CEFAZOLIN <=4 SENSITIVE Sensitive     CEFEPIME <=0.12 SENSITIVE Sensitive     CEFTRIAXONE <=0.25 SENSITIVE Sensitive     CIPROFLOXACIN <=0.25 SENSITIVE Sensitive     GENTAMICIN <=1 SENSITIVE Sensitive     IMIPENEM <=0.25 SENSITIVE Sensitive     NITROFURANTOIN 32 SENSITIVE Sensitive     TRIMETH/SULFA <=20 SENSITIVE Sensitive     AMPICILLIN/SULBACTAM <=2 SENSITIVE Sensitive     PIP/TAZO <=4 SENSITIVE Sensitive     * >=100,000 COLONIES/mL KLEBSIELLA PNEUMONIAE    Patient presenting with L upper and lower abdominal pain, N/V, and diarrhea. No urinary symptoms present and history not consistent with UTI. Symptoms found to be attributable to viral illness. Patient given fentanyl, zofran, and fluids in the ED and felt better. No additional treatment needed.    ED Provider: Barnie Alderman, PA   [name] 07/01/2023, 7:43 AM Clinical Pharmacist Monday - Friday phone -  [phone] Saturday - Sunday phone - [phone]

## 2023-07-05 ENCOUNTER — Ambulatory Visit (INDEPENDENT_AMBULATORY_CARE_PROVIDER_SITE_OTHER): Payer: Medicaid Other | Admitting: Family

## 2023-07-05 ENCOUNTER — Encounter: Payer: Self-pay | Admitting: Family

## 2023-07-05 DIAGNOSIS — Z89512 Acquired absence of left leg below knee: Secondary | ICD-10-CM | POA: Diagnosis not present

## 2023-07-05 DIAGNOSIS — I87331 Chronic venous hypertension (idiopathic) with ulcer and inflammation of right lower extremity: Secondary | ICD-10-CM | POA: Diagnosis not present

## 2023-07-05 DIAGNOSIS — L98491 Non-pressure chronic ulcer of skin of other sites limited to breakdown of skin: Secondary | ICD-10-CM

## 2023-07-05 NOTE — Progress Notes (Unsigned)
Office Visit Note   Patient: Adam Long           Date of Birth: 03-20-1976           MRN: [id number] Visit Date: 07/05/2023              Requested by: [name], DO [address] [place],  [place] [address] PCP: [name], DO  Chief Complaint  Patient presents with   Right Leg - Wound Check    Registry #20 visit # 6      HPI: The patient is a 47 year old gentleman seen in follow-up for venous insufficiency ulcer due to trauma to his right lower extremity.  He had been in serial compression wraps for several weeks.  The wound has healed.  He has been in CircAid compression wraps for the last week.  He has no concerns today.  Assessment & Plan: Visit Diagnoses: No diagnosis found.  Plan: Ulcer remains healed.  He will continue using close monitoring of the lower extremity the CircAid wraps for compression he will follow-up in the office as needed  Follow-Up Instructions: No follow-ups on file.   Ortho Exam  Patient is alert, oriented, no adenopathy, well-dressed, normal affect, normal respiratory effort. On examination of the right lower extremity there is no open area there is mild erythema with dermatitis some dry flaking skin.  There is no cellulitis no weeping.  Stable edema.  Imaging: No results found. No images are attached to the encounter.  Labs: Lab Results  Component Value Date   HGBA1C 13.4 (H) 01/24/2023   HGBA1C 8.6 (H) 04/27/2022   HGBA1C 6.4 (H) 10/30/2021   ESRSEDRATE 60 (H) 09/14/2021   ESRSEDRATE 65 (H) 09/07/2021   ESRSEDRATE 104 (H) 08/29/2021   CRP 1.5 (H) 09/14/2021   CRP 3.4 (H) 09/07/2021   CRP 12.8 (H) 08/29/2021   LABURIC 7.9 02/07/2023   REPTSTATUS 06/30/2023 FINAL 06/28/2023   GRAMSTAIN  01/26/2023    RARE WBC PRESENT, PREDOMINANTLY PMN FEW GRAM POSITIVE COCCI IN PAIRS FEW GRAM POSITIVE COCCI IN CLUSTERS    CULT >=100,000 COLONIES/mL KLEBSIELLA PNEUMONIAE (A) 06/28/2023   LABORGA KLEBSIELLA PNEUMONIAE (A) 06/28/2023      Lab Results  Component Value Date   ALBUMIN 3.3 (L) 06/27/2023   ALBUMIN 2.4 (L) 02/06/2023   ALBUMIN 1.9 (L) 01/26/2023   PREALBUMIN 21.6 10/29/2021    Lab Results  Component Value Date   MG 2.0 02/11/2023   MG 2.1 02/10/2023   MG 2.1 02/09/2023   Lab Results  Component Value Date   VD25OH 15.4 (L) 04/27/2022   VD25OH 38 06/13/2020    Lab Results  Component Value Date   PREALBUMIN 21.6 10/29/2021      Latest Ref Rng & Units 06/27/2023    9:00 PM 02/17/2023    2:49 AM 02/16/2023    3:36 AM  CBC EXTENDED  WBC 4.0 - 10.5 K/uL 11.9  8.3  6.5   RBC 4.22 - 5.81 MIL/uL 4.01  3.49  3.42   Hemoglobin 13.0 - 17.0 g/dL 8.8  9.3  9.3   HCT 19.1 - 52.0 % 28.8  29.1  29.0   Platelets 150 - 400 K/uL 239  236  222      There is no height or weight on file to calculate BMI.  Orders:  No orders of the defined types were placed in this encounter.  No orders of the defined types were placed in this encounter.  Procedures: No procedures performed  Clinical Data: No additional findings.  ROS:  All other systems negative, except as noted in the HPI. Review of Systems  Objective: Vital Signs: There were no vitals taken for this visit.  Specialty Comments:  No specialty comments available.  PMFS History: Patient Active Problem List   Diagnosis Date Noted   Hepatic steatosis 03/25/2023   Acute blood loss anemia 02/06/2023   GERD (gastroesophageal reflux disease) 01/24/2023   History of osteomyelitis 10/12/2022   Uncontrolled type 2 diabetes mellitus with hyperglycemia (HCC) 10/12/2022   CAD (coronary artery disease) 10/12/2022   Encounter for therapeutic drug monitoring 10/12/2022   S/P BKA (below knee amputation), left (HCC) 10/30/2021   Dehiscence of amputation stump of left lower extremity (HCC)    Anxiety 09/30/2020   Preventative health care 12/13/2019   S/P aortic valve replacement with mechanical valve 12/05/2019   Hyperlipidemia associated with type  2 diabetes mellitus (HCC) 10/12/2015   Class 3 obesity 10/12/2015   HTN (hypertension) 07/02/2013   Past Medical History:  Diagnosis Date   Anxiety    Aortic stenosis    Cellulitis and abscess of lower extremity 06/11/2019   Cellulitis of fourth toe of left foot    Cholelithiasis    Coronary artery disease    Nonobstructive CAD (40-50% LAD) 08/2019   Depression    Diabetes mellitus without complication (HCC)    Phreesia 09/27/2020   Elevated troponin level not due myocardial infarction 11/11/2019   Essential hypertension    Gangrene of toe of left foot (HCC) 07/06/2019   Heart murmur    Phreesia 09/27/2020   Hyperlipidemia    Phreesia 09/27/2020   Hypertension    Phreesia 09/27/2020   Mixed hyperlipidemia    Morbid obesity (HCC)    S/P aortic valve replacement with mechanical valve 12/05/2019   25 mm Carbomedics top hat bileaflet mechanical valve via partial upper hemi-sternotomy   Severe aortic stenosis 09/24/2019   Type 2 diabetes mellitus (HCC)     Family History  Problem Relation Age of Onset   Cancer Mother        Brain tumor   Heart disease Father    Hyperlipidemia Father    Hypertension Father    Stroke Father    Heart murmur Sister    Heart attack Maternal Grandmother    Liver disease Maternal Grandfather    Breast cancer Paternal Grandmother    COPD Paternal Grandfather    Heart attack Maternal Aunt    Colon cancer Maternal Uncle 67   Pancreatic cancer Paternal Aunt    Heart disease Paternal Uncle    Prostate cancer Neg Hx     Past Surgical History:  Procedure Laterality Date   ABDOMINAL AORTOGRAM W/LOWER EXTREMITY N/A 07/06/2019   Procedure: ABDOMINAL AORTOGRAM W/LOWER EXTREMITY;  Surgeon: Sherren Kerns, MD;  Location: [facility] INVASIVE CV LAB;  Service: Cardiovascular;  Laterality: N/A;  Bilateral   AMPUTATION Left 07/09/2019   Procedure: LEFT FOURTH and Fifth TOE AMPUTATION.;  Surgeon: Larina Earthly, MD;  Location: [facility] OR;  Service: Vascular;   Laterality: Left;   AMPUTATION Left 03/11/2021   Procedure: LEFT FOOT 5TH  AND 4TH RAY AMPUTATION;  Surgeon: Nadara Mustard, MD;  Location: [facility] OR;  Service: Orthopedics;  Laterality: Left;   AMPUTATION Left 08/28/2021   Procedure: AMPUTATION BELOW KNEE;  Surgeon: Nadara Mustard, MD;  Location: [facility] OR;  Service: Orthopedics;  Laterality: Left;   AMPUTATION Left 01/29/2023   Procedure: LEFT AMPUTATION  BELOW KNEE REVISION AND CLOSURE;  Surgeon: Nadara Mustard, MD;  Location: [facility] OR;  Service: Orthopedics;  Laterality: Left;   AORTIC VALVE REPLACEMENT N/A 12/05/2019   Procedure: PARTIAL STERNOTOMY FOR AORTIC VALVE REPLACEMENT (AVR), USING CARBOMEDICS SUPRA-ANNULAR TOP HAT ;  Surgeon: Purcell Nails, MD;  Location: [facility] OR;  Service: Open Heart Surgery;  Laterality: N/A;  No neck lines on left   APPLICATION OF WOUND VAC Left 10/30/2021   Procedure: APPLICATION OF WOUND VAC;  Surgeon: Nadara Mustard, MD;  Location: [facility] OR;  Service: Orthopedics;  Laterality: Left;   APPLICATION OF WOUND VAC Left 12/25/2021   Procedure: APPLICATION OF WOUND VAC;  Surgeon: Nadara Mustard, MD;  Location: [facility] OR;  Service: Orthopedics;  Laterality: Left;   APPLICATION OF WOUND VAC Left 01/26/2023   Procedure: APPLICATION OF WOUND VAC TO LEFT STUMP;  Surgeon: Tarry Kos, MD;  Location: [facility] OR;  Service: Orthopedics;  Laterality: Left;   CARDIAC VALVE REPLACEMENT N/A    Phreesia 09/27/2020   I & D EXTREMITY Left 01/26/2023   Procedure: IRRIGATION AND DEBRIDEMENT OF LEFT BKA STUMP;  Surgeon: Tarry Kos, MD;  Location: [facility] OR;  Service: Orthopedics;  Laterality: Left;   IR RADIOLOGY PERIPHERAL GUIDED IV START  10/05/2019   IR US GUIDE VASC ACCESS RIGHT  10/05/2019   MULTIPLE EXTRACTIONS WITH ALVEOLOPLASTY N/A 10/26/2019   Procedure: EXTRACTION OF TOOTH #'S 3, 5-11,19-28,  AND 32 WITH ALVEOLOPLASTY;  Surgeon: Charlynne Pander, DDS;  Location: [facility] OR;  Service: Oral Surgery;  Laterality: N/A;   RIGHT HEART CATH AND CORONARY ANGIOGRAPHY  N/A 09/24/2019   Procedure: RIGHT HEART CATH AND CORONARY ANGIOGRAPHY;  Surgeon: Tonny Bollman, MD;  Location[facility] INVASIVE CV LAB;  Service: Cardiovascular;  Laterality: N/A;   STUMP REVISION Left 10/30/2021   Procedure: REVISION LEFT BELOW KNEE AMPUTATION;  Surgeon: Nadara Mustard, MD;  Location[facility] OR;  Service: Orthopedics;  Laterality: Left;   STUMP REVISION Left 12/25/2021   Procedure: REVISION LEFT BELOW KNEE AMPUTATION;  Surgeon: Nadara Mustard, MD;  Location: [facility] OR;  Service: Orthopedics;  Laterality: Left;   TEE WITHOUT CARDIOVERSION N/A 12/05/2019   Procedure: TRANSESOPHAGEAL ECHOCARDIOGRAM (TEE);  Surgeon: Purcell Nails, MD;  Location: [facility] OR;  Service: Open Heart Surgery;  Laterality: N/A;   TEE WITHOUT CARDIOVERSION N/A 09/01/2021   Procedure: TRANSESOPHAGEAL ECHOCARDIOGRAM (TEE);  Surgeon: Little Ishikawa, MD;  Location: [facility] ENDOSCOPY;  Service: Cardiovascular;  Laterality: N/A;   Social History   Occupational History    Comment: Glass blower/designer- self-employed  Tobacco Use   Smoking status: Never   Smokeless tobacco: Former    Types: Chew    Quit date: 2021  Vaping Use   Vaping status: Never Used  Substance and Sexual Activity   Alcohol use: Yes    Alcohol/week: 4.0 standard drinks of alcohol    Types: 4 Cans of beer per week    Comment: occasionally   Drug use: No   Sexual activity: Yes    Birth control/protection: None

## 2023-07-06 ENCOUNTER — Encounter: Payer: Self-pay | Admitting: Family

## 2023-07-22 ENCOUNTER — Telehealth: Payer: Self-pay | Admitting: Pharmacist

## 2023-07-25 ENCOUNTER — Inpatient Hospital Stay (HOSPITAL_COMMUNITY)
Admission: EM | Admit: 2023-07-25 | Discharge: 2023-07-31 | DRG: 464 | Disposition: A | Discharge status: Rehabilitation Facility | Payer: Medicaid Other | Attending: Internal Medicine | Admitting: Internal Medicine

## 2023-07-25 ENCOUNTER — Emergency Department (HOSPITAL_COMMUNITY): Payer: Medicaid Other

## 2023-07-25 ENCOUNTER — Encounter (HOSPITAL_COMMUNITY): Payer: Self-pay | Admitting: Emergency Medicine

## 2023-07-25 DIAGNOSIS — E782 Mixed hyperlipidemia: Secondary | ICD-10-CM | POA: Diagnosis present

## 2023-07-25 DIAGNOSIS — L0390 Cellulitis, unspecified: Secondary | ICD-10-CM | POA: Diagnosis present

## 2023-07-25 DIAGNOSIS — B961 Klebsiella pneumoniae [K. pneumoniae] as the cause of diseases classified elsewhere: Secondary | ICD-10-CM | POA: Diagnosis present

## 2023-07-25 DIAGNOSIS — Z800 Family history of malignant neoplasm of digestive organs: Secondary | ICD-10-CM

## 2023-07-25 DIAGNOSIS — R3129 Other microscopic hematuria: Secondary | ICD-10-CM | POA: Diagnosis not present

## 2023-07-25 DIAGNOSIS — Z803 Family history of malignant neoplasm of breast: Secondary | ICD-10-CM

## 2023-07-25 DIAGNOSIS — Z952 Presence of prosthetic heart valve: Secondary | ICD-10-CM | POA: Diagnosis not present

## 2023-07-25 DIAGNOSIS — T8742 Infection of amputation stump, left upper extremity: Secondary | ICD-10-CM | POA: Diagnosis not present

## 2023-07-25 DIAGNOSIS — E1151 Type 2 diabetes mellitus with diabetic peripheral angiopathy without gangrene: Secondary | ICD-10-CM | POA: Diagnosis present

## 2023-07-25 DIAGNOSIS — F419 Anxiety disorder, unspecified: Secondary | ICD-10-CM | POA: Diagnosis present

## 2023-07-25 DIAGNOSIS — Z23 Encounter for immunization: Secondary | ICD-10-CM

## 2023-07-25 DIAGNOSIS — D62 Acute posthemorrhagic anemia: Secondary | ICD-10-CM | POA: Diagnosis not present

## 2023-07-25 DIAGNOSIS — E860 Dehydration: Secondary | ICD-10-CM | POA: Diagnosis present

## 2023-07-25 DIAGNOSIS — L089 Local infection of the skin and subcutaneous tissue, unspecified: Principal | ICD-10-CM

## 2023-07-25 DIAGNOSIS — M792 Neuralgia and neuritis, unspecified: Secondary | ICD-10-CM | POA: Diagnosis not present

## 2023-07-25 DIAGNOSIS — Z954 Presence of other heart-valve replacement: Secondary | ICD-10-CM | POA: Diagnosis not present

## 2023-07-25 DIAGNOSIS — Z89612 Acquired absence of left leg above knee: Secondary | ICD-10-CM

## 2023-07-25 DIAGNOSIS — D649 Anemia, unspecified: Secondary | ICD-10-CM | POA: Diagnosis not present

## 2023-07-25 DIAGNOSIS — D638 Anemia in other chronic diseases classified elsewhere: Secondary | ICD-10-CM | POA: Diagnosis present

## 2023-07-25 DIAGNOSIS — R195 Other fecal abnormalities: Secondary | ICD-10-CM | POA: Diagnosis not present

## 2023-07-25 DIAGNOSIS — E871 Hypo-osmolality and hyponatremia: Secondary | ICD-10-CM | POA: Diagnosis present

## 2023-07-25 DIAGNOSIS — D75838 Other thrombocytosis: Secondary | ICD-10-CM | POA: Diagnosis present

## 2023-07-25 DIAGNOSIS — Z83438 Family history of other disorder of lipoprotein metabolism and other lipidemia: Secondary | ICD-10-CM

## 2023-07-25 DIAGNOSIS — N390 Urinary tract infection, site not specified: Secondary | ICD-10-CM | POA: Diagnosis present

## 2023-07-25 DIAGNOSIS — I10 Essential (primary) hypertension: Secondary | ICD-10-CM | POA: Diagnosis present

## 2023-07-25 DIAGNOSIS — Z8249 Family history of ischemic heart disease and other diseases of the circulatory system: Secondary | ICD-10-CM

## 2023-07-25 DIAGNOSIS — N200 Calculus of kidney: Secondary | ICD-10-CM | POA: Diagnosis present

## 2023-07-25 DIAGNOSIS — T8740 Infection of amputation stump, unspecified extremity: Secondary | ICD-10-CM | POA: Diagnosis not present

## 2023-07-25 DIAGNOSIS — Z87891 Personal history of nicotine dependence: Secondary | ICD-10-CM

## 2023-07-25 DIAGNOSIS — Z6841 Body Mass Index (BMI) 40.0 and over, adult: Secondary | ICD-10-CM

## 2023-07-25 DIAGNOSIS — Z5986 Financial insecurity: Secondary | ICD-10-CM

## 2023-07-25 DIAGNOSIS — Z7984 Long term (current) use of oral hypoglycemic drugs: Secondary | ICD-10-CM

## 2023-07-25 DIAGNOSIS — Y835 Amputation of limb(s) as the cause of abnormal reaction of the patient, or of later complication, without mention of misadventure at the time of the procedure: Secondary | ICD-10-CM | POA: Diagnosis present

## 2023-07-25 DIAGNOSIS — T8781 Dehiscence of amputation stump: Secondary | ICD-10-CM | POA: Diagnosis present

## 2023-07-25 DIAGNOSIS — E1140 Type 2 diabetes mellitus with diabetic neuropathy, unspecified: Secondary | ICD-10-CM | POA: Diagnosis present

## 2023-07-25 DIAGNOSIS — E1165 Type 2 diabetes mellitus with hyperglycemia: Secondary | ICD-10-CM | POA: Diagnosis present

## 2023-07-25 DIAGNOSIS — F32A Depression, unspecified: Secondary | ICD-10-CM | POA: Diagnosis present

## 2023-07-25 DIAGNOSIS — L03116 Cellulitis of left lower limb: Secondary | ICD-10-CM | POA: Diagnosis present

## 2023-07-25 DIAGNOSIS — Z825 Family history of asthma and other chronic lower respiratory diseases: Secondary | ICD-10-CM

## 2023-07-25 DIAGNOSIS — G8929 Other chronic pain: Secondary | ICD-10-CM | POA: Diagnosis present

## 2023-07-25 DIAGNOSIS — I2510 Atherosclerotic heart disease of native coronary artery without angina pectoris: Secondary | ICD-10-CM | POA: Diagnosis present

## 2023-07-25 DIAGNOSIS — Z79899 Other long term (current) drug therapy: Secondary | ICD-10-CM

## 2023-07-25 DIAGNOSIS — E1142 Type 2 diabetes mellitus with diabetic polyneuropathy: Secondary | ICD-10-CM | POA: Diagnosis not present

## 2023-07-25 DIAGNOSIS — T8744 Infection of amputation stump, left lower extremity: Secondary | ICD-10-CM | POA: Diagnosis present

## 2023-07-25 DIAGNOSIS — Z7901 Long term (current) use of anticoagulants: Secondary | ICD-10-CM

## 2023-07-25 DIAGNOSIS — Z823 Family history of stroke: Secondary | ICD-10-CM

## 2023-07-25 DIAGNOSIS — Z4781 Encounter for orthopedic aftercare following surgical amputation: Secondary | ICD-10-CM | POA: Diagnosis not present

## 2023-07-25 DIAGNOSIS — S78112A Complete traumatic amputation at level between left hip and knee, initial encounter: Secondary | ICD-10-CM | POA: Diagnosis not present

## 2023-07-25 DIAGNOSIS — D75839 Thrombocytosis, unspecified: Secondary | ICD-10-CM | POA: Diagnosis not present

## 2023-07-25 DIAGNOSIS — K5900 Constipation, unspecified: Secondary | ICD-10-CM | POA: Diagnosis not present

## 2023-07-25 DIAGNOSIS — R7989 Other specified abnormal findings of blood chemistry: Secondary | ICD-10-CM | POA: Diagnosis not present

## 2023-07-25 DIAGNOSIS — R739 Hyperglycemia, unspecified: Secondary | ICD-10-CM | POA: Diagnosis not present

## 2023-07-25 DIAGNOSIS — Z794 Long term (current) use of insulin: Secondary | ICD-10-CM | POA: Diagnosis not present

## 2023-07-25 DIAGNOSIS — Z8614 Personal history of Methicillin resistant Staphylococcus aureus infection: Secondary | ICD-10-CM

## 2023-07-25 DIAGNOSIS — K219 Gastro-esophageal reflux disease without esophagitis: Secondary | ICD-10-CM | POA: Diagnosis present

## 2023-07-25 LAB — CBC WITH DIFFERENTIAL/PLATELET
Abs Immature Granulocytes: 0.48 10*3/uL — ABNORMAL HIGH (ref 0.00–0.07)
Basophils Absolute: 0 10*3/uL (ref 0.0–0.1)
Basophils Relative: 1 %
Eosinophils Absolute: 0.1 10*3/uL (ref 0.0–0.5)
Eosinophils Relative: 1 %
HCT: 28.4 % — ABNORMAL LOW (ref 39.0–52.0)
Hemoglobin: 8.1 g/dL — ABNORMAL LOW (ref 13.0–17.0)
Immature Granulocytes: 6 %
Lymphocytes Relative: 15 %
Lymphs Abs: 1.3 10*3/uL (ref 0.7–4.0)
MCH: 21 pg — ABNORMAL LOW (ref 26.0–34.0)
MCHC: 28.5 g/dL — ABNORMAL LOW (ref 30.0–36.0)
MCV: 73.8 fL — ABNORMAL LOW (ref 80.0–100.0)
Monocytes Absolute: 0.5 10*3/uL (ref 0.1–1.0)
Monocytes Relative: 5 %
Neutro Abs: 6.3 10*3/uL (ref 1.7–7.7)
Neutrophils Relative %: 72 %
Platelets: 390 10*3/uL (ref 150–400)
RBC: 3.85 MIL/uL — ABNORMAL LOW (ref 4.22–5.81)
RDW: 18.7 % — ABNORMAL HIGH (ref 11.5–15.5)
WBC: 8.6 10*3/uL (ref 4.0–10.5)
nRBC: 0.2 % (ref 0.0–0.2)

## 2023-07-25 LAB — COMPREHENSIVE METABOLIC PANEL
ALT: 23 U/L (ref 0–44)
AST: 24 U/L (ref 15–41)
Albumin: 2.6 g/dL — ABNORMAL LOW (ref 3.5–5.0)
Alkaline Phosphatase: 53 U/L (ref 38–126)
Anion gap: 8 (ref 5–15)
BUN: 14 mg/dL (ref 6–20)
CO2: 22 mmol/L (ref 22–32)
Calcium: 8.2 mg/dL — ABNORMAL LOW (ref 8.9–10.3)
Chloride: 98 mmol/L (ref 98–111)
Creatinine, Ser: 0.85 mg/dL (ref 0.61–1.24)
GFR, Estimated: >60 mL/min (ref >60)
Glucose, Bld: 293 mg/dL — ABNORMAL HIGH (ref 70–99)
Potassium: 4.2 mmol/L (ref 3.5–5.1)
Sodium: 128 mmol/L — ABNORMAL LOW (ref 135–145)
Total Bilirubin: 0.6 mg/dL (ref 0.3–1.2)
Total Protein: 7.3 g/dL (ref 6.5–8.1)

## 2023-07-25 LAB — URINALYSIS, W/ REFLEX TO CULTURE (INFECTION SUSPECTED)
Bilirubin Urine: NEGATIVE
Glucose, UA: >=500 mg/dL — AB
Ketones, ur: NEGATIVE mg/dL
Nitrite: NEGATIVE
Protein, ur: 30 mg/dL — AB
RBC / HPF: >50 RBC/hpf (ref 0–5)
Specific Gravity, Urine: 1.018 (ref 1.005–1.030)
WBC, UA: >50 WBC/hpf (ref 0–5)
pH: 5 (ref 5.0–8.0)

## 2023-07-25 LAB — PROTIME-INR
INR: 2.5 — ABNORMAL HIGH (ref 0.8–1.2)
Prothrombin Time: 27.2 s — ABNORMAL HIGH (ref 11.4–15.2)

## 2023-07-25 LAB — I-STAT CG4 LACTIC ACID, ED
Lactic Acid, Venous: 1.6 mmol/L (ref 0.5–1.9)
Lactic Acid, Venous: 1.7 mmol/L (ref 0.5–1.9)

## 2023-07-25 LAB — HEMOGLOBIN A1C
Hgb A1c MFr Bld: 10.2 % — ABNORMAL HIGH (ref 4.8–5.6)
Mean Plasma Glucose: 246.04 mg/dL

## 2023-07-25 LAB — TYPE AND SCREEN
ABO/RH(D): A POS
Antibody Screen: NEGATIVE

## 2023-07-25 LAB — OSMOLALITY: Osmolality: 299 mosm/kg — ABNORMAL HIGH (ref 275–295)

## 2023-07-25 LAB — GLUCOSE, CAPILLARY: Glucose-Capillary: 244 mg/dL — ABNORMAL HIGH (ref 70–99)

## 2023-07-25 MED ORDER — SODIUM CHLORIDE 0.9 % IV SOLN
2.0000 g | INTRAVENOUS | Status: AC
  Administered 2023-07-26 – 2023-07-28 (×3): 2 g via INTRAVENOUS
  Filled 2023-07-25 (×3): qty 20

## 2023-07-25 MED ORDER — SODIUM CHLORIDE 0.9 % IV SOLN
2.0000 g | Freq: Once | INTRAVENOUS | Status: AC
  Administered 2023-07-25: 2 g via INTRAVENOUS
  Filled 2023-07-25: qty 20

## 2023-07-25 MED ORDER — GABAPENTIN 300 MG PO CAPS
600.0000 mg | ORAL_CAPSULE | Freq: Two times a day (BID) | ORAL | Status: DC
  Administered 2023-07-25 – 2023-07-31 (×12): 600 mg via ORAL
  Filled 2023-07-25 (×12): qty 2

## 2023-07-25 MED ORDER — ACETAMINOPHEN 325 MG PO TABS
650.0000 mg | ORAL_TABLET | Freq: Four times a day (QID) | ORAL | Status: DC | PRN
  Administered 2023-07-26: 650 mg via ORAL
  Filled 2023-07-25: qty 2

## 2023-07-25 MED ORDER — SODIUM CHLORIDE 0.9 % IV SOLN: INTRAVENOUS | Status: DC

## 2023-07-25 MED ORDER — WARFARIN - PHARMACIST DOSING INPATIENT: Freq: Every day | Status: DC

## 2023-07-25 MED ORDER — LISINOPRIL 20 MG PO TABS: 20.0000 mg | ORAL_TABLET | Freq: Every day | ORAL | Status: DC

## 2023-07-25 MED ORDER — MORPHINE SULFATE (PF) 4 MG/ML IV SOLN
4.0000 mg | Freq: Once | INTRAVENOUS | Status: AC
  Administered 2023-07-25: 4 mg via INTRAVENOUS
  Filled 2023-07-25: qty 1

## 2023-07-25 MED ORDER — INSULIN GLARGINE-YFGN 100 UNIT/ML ~~LOC~~ SOLN
30.0000 [IU] | Freq: Every day | SUBCUTANEOUS | Status: DC
  Administered 2023-07-25 – 2023-07-30 (×6): 30 [IU] via SUBCUTANEOUS
  Filled 2023-07-25 (×7): qty 0.3

## 2023-07-25 MED ORDER — PANTOPRAZOLE SODIUM 40 MG PO TBEC
40.0000 mg | DELAYED_RELEASE_TABLET | Freq: Every day | ORAL | Status: DC
  Administered 2023-07-26 – 2023-07-31 (×6): 40 mg via ORAL
  Filled 2023-07-25 (×6): qty 1

## 2023-07-25 MED ORDER — VANCOMYCIN HCL 2000 MG/400ML IV SOLN
2000.0000 mg | Freq: Once | INTRAVENOUS | Status: AC
  Administered 2023-07-25: 2000 mg via INTRAVENOUS
  Filled 2023-07-25: qty 400

## 2023-07-25 MED ORDER — VANCOMYCIN HCL 10 G IV SOLR
2500.0000 mg | Freq: Once | INTRAVENOUS | Status: DC
  Filled 2023-07-25: qty 25

## 2023-07-25 MED ORDER — ONDANSETRON HCL 4 MG/2ML IJ SOLN
4.0000 mg | Freq: Once | INTRAMUSCULAR | Status: AC
  Administered 2023-07-25: 4 mg via INTRAVENOUS
  Filled 2023-07-25: qty 2

## 2023-07-25 MED ORDER — WARFARIN SODIUM 5 MG PO TABS
5.0000 mg | ORAL_TABLET | Freq: Once | ORAL | Status: AC
  Administered 2023-07-25: 5 mg via ORAL
  Filled 2023-07-25: qty 1

## 2023-07-25 MED ORDER — EZETIMIBE 10 MG PO TABS
10.0000 mg | ORAL_TABLET | Freq: Every day | ORAL | Status: DC
  Administered 2023-07-26 – 2023-07-31 (×6): 10 mg via ORAL
  Filled 2023-07-25 (×6): qty 1

## 2023-07-25 MED ORDER — INSULIN ASPART 100 UNIT/ML IJ SOLN
0.0000 [IU] | Freq: Three times a day (TID) | INTRAMUSCULAR | Status: DC
  Administered 2023-07-26: 3 [IU] via SUBCUTANEOUS
  Administered 2023-07-26 – 2023-07-28 (×6): 8 [IU] via SUBCUTANEOUS
  Administered 2023-07-28 – 2023-07-29 (×2): 5 [IU] via SUBCUTANEOUS
  Administered 2023-07-29: 8 [IU] via SUBCUTANEOUS
  Administered 2023-07-29: 15 [IU] via SUBCUTANEOUS
  Administered 2023-07-30: 3 [IU] via SUBCUTANEOUS
  Administered 2023-07-30 (×2): 5 [IU] via SUBCUTANEOUS

## 2023-07-25 MED ORDER — METOPROLOL TARTRATE 25 MG PO TABS
25.0000 mg | ORAL_TABLET | Freq: Two times a day (BID) | ORAL | Status: DC
  Administered 2023-07-26 – 2023-07-31 (×11): 25 mg via ORAL
  Filled 2023-07-25 (×11): qty 1

## 2023-07-25 MED ORDER — INSULIN ASPART 100 UNIT/ML IJ SOLN
0.0000 [IU] | Freq: Every day | INTRAMUSCULAR | Status: DC
Start: 2023-07-25 — End: 2023-07-31
  Administered 2023-07-25 – 2023-07-26 (×2): 2 [IU] via SUBCUTANEOUS
  Administered 2023-07-27: 5 [IU] via SUBCUTANEOUS
  Administered 2023-07-28: 2 [IU] via SUBCUTANEOUS
  Administered 2023-07-29: 3 [IU] via SUBCUTANEOUS
  Administered 2023-07-30: 4 [IU] via SUBCUTANEOUS

## 2023-07-25 MED ORDER — SERTRALINE HCL 50 MG PO TABS
50.0000 mg | ORAL_TABLET | Freq: Every day | ORAL | Status: DC
  Administered 2023-07-26 – 2023-07-31 (×6): 50 mg via ORAL
  Filled 2023-07-25 (×7): qty 1

## 2023-07-25 MED ORDER — VANCOMYCIN HCL 1500 MG/300ML IV SOLN
1500.0000 mg | Freq: Once | INTRAVENOUS | Status: DC
  Filled 2023-07-25 (×2): qty 300

## 2023-07-25 MED ORDER — ACETAMINOPHEN 650 MG RE SUPP: 650.0000 mg | Freq: Four times a day (QID) | RECTAL | Status: DC | PRN

## 2023-07-25 NOTE — Progress Notes (Signed)
PHARMACY - ANTICOAGULATION CONSULT NOTE  Pharmacy Consult for warfarin Indication: AVR replacement   No Known Allergies  Patient Measurements: Height: 5\' 10"  (177.8 cm) Weight: (!) 169.5 kg (373 lb 10.9 oz) IBW/kg (Calculated) : 73  Vital Signs: Temp: 97.8 F (36.6 C) (10/28 2212) Temp Source: Oral (10/28 2212) BP: 147/80 (10/28 2212) Pulse Rate: 79 (10/28 2212)  Labs: Recent Labs    07/25/23 1544 07/25/23 2251  HGB 8.1*  --   HCT 28.4*  --   PLT 390  --   LABPROT  --  27.2*  INR  --  2.5*  CREATININE 0.85  --     Estimated Creatinine Clearance: 169.6 mL/min (by C-G formula based on SCr of 0.85 mg/dL).   Medical History: Past Medical History:  Diagnosis Date   Anxiety    Aortic stenosis    Cellulitis and abscess of lower extremity 06/11/2019   Cellulitis of fourth toe of left foot    Cholelithiasis    Coronary artery disease    Nonobstructive CAD (40-50% LAD) 08/2019   Depression    Diabetes mellitus without complication (HCC)    Phreesia 09/27/2020   Elevated troponin level not due myocardial infarction 11/11/2019   Essential hypertension    Gangrene of toe of left foot (HCC) 07/06/2019   Heart murmur    Phreesia 09/27/2020   Hyperlipidemia    Phreesia 09/27/2020   Hypertension    Phreesia 09/27/2020   Mixed hyperlipidemia    Morbid obesity (HCC)    S/P aortic valve replacement with mechanical valve 12/05/2019   25 mm Carbomedics top hat bileaflet mechanical valve via partial upper hemi-sternotomy   Severe aortic stenosis 09/24/2019   Type 2 diabetes mellitus (HCC)    Assessment: 20 yoM with presented with left BKA site infection. Pharmacy consulted to dose warfarin for mechanical AVR.  PTA warfarin regimen: 5mg  PO every Monday, Wednesday, Friday and 10mg  PO all other days  INR 2.5, last dose given on 10/27, Hgb 8.1 (close to baseline)  Goal of Therapy:  INR 2-3 Monitor platelets by anticoagulation protocol: Yes   Plan:  -Warfarin 5mg  PO  MWF and 10mg  PO all other days -daily PT/INR -CBC daily  Arabella Merles, PharmD. Clinical Pharmacist 07/25/2023 11:49 PM

## 2023-07-25 NOTE — H&P (Signed)
History and Physical    Adam Long EXB:[id number] DOB: [birth year] DOA: 07/25/2023  PCP: [name], DO  Patient coming from: Home  Chief Complaint: Left BKA stump infection  HPI: Adam Long is a 47 y.o. male with medical history significant of CAD, hypertension, hyperlipidemia, severe aortic stenosis status post mechanical AVR on Coumadin, insulin-dependent type 2 diabetes, left BKA with last revision surgery in May 2024 due to dehiscence, history of MRSA stump infection, anxiety, depression, morbid obesity presented to the ED due to concern for left BKA stump infection.  Afebrile and vital signs stable.  X-ray showing diffuse soft tissue swelling consistent with cellulitis but no evidence of acute osteomyelitis.  Labs showing no leukocytosis, hemoglobin 8.1 (close to baseline), sodium 128 (previously in the low 130s), glucose 293, lactate normal x 2.  UA with negative nitrite, trace leukocytes, and microscopy showing >50 RBCs, >50 WBCs, and few bacteria.  Urine culture pending.  Blood cultures collected.  Patient was given morphine, Zofran, vancomycin, and ceftriaxone.  TR[facility] called to admit.  Patient states a few days ago he noticed that his left BKA stump site appeared red and since yesterday he has noticed copious amounts of purulent drainage.  Reports subjective fevers and chills.  States a month ago he was seen in the emergency room for left-sided abdominal pain and had a CT scan done which showed kidney stones.  He is no longer having any abdominal/flank pain.  Denies vomiting or any urinary symptoms.  Denies shortness breath or chest pain.  Review of Systems:  Review of Systems  All other systems reviewed and are negative.   Past Medical History:  Diagnosis Date   Anxiety    Aortic stenosis    Cellulitis and abscess of lower extremity 06/11/2019   Cellulitis of fourth toe of left foot    Cholelithiasis    Coronary artery disease    Nonobstructive CAD (40-50% LAD)  08/2019   Depression    Diabetes mellitus without complication (HCC)    Phreesia 09/27/2020   Elevated troponin level not due myocardial infarction 11/11/2019   Essential hypertension    Gangrene of toe of left foot (HCC) 07/06/2019   Heart murmur    Phreesia 09/27/2020   Hyperlipidemia    Phreesia 09/27/2020   Hypertension    Phreesia 09/27/2020   Mixed hyperlipidemia    Morbid obesity (HCC)    S/P aortic valve replacement with mechanical valve 12/05/2019   25 mm Carbomedics top hat bileaflet mechanical valve via partial upper hemi-sternotomy   Severe aortic stenosis 09/24/2019   Type 2 diabetes mellitus (HCC)     Past Surgical History:  Procedure Laterality Date   ABDOMINAL AORTOGRAM W/LOWER EXTREMITY N/A 07/06/2019   Procedure: ABDOMINAL AORTOGRAM W/LOWER EXTREMITY;  Surgeon: Sherren Kerns, MD;  Location: [facility] INVASIVE CV LAB;  Service: Cardiovascular;  Laterality: N/A;  Bilateral   AMPUTATION Left 07/09/2019   Procedure: LEFT FOURTH and Fifth TOE AMPUTATION.;  Surgeon: Larina Earthly, MD;  Location: [facility] OR;  Service: Vascular;  Laterality: Left;   AMPUTATION Left 03/11/2021   Procedure: LEFT FOOT 5TH  AND 4TH RAY AMPUTATION;  Surgeon: Nadara Mustard, MD;  Location: [facility] OR;  Service: Orthopedics;  Laterality: Left;   AMPUTATION Left 08/28/2021   Procedure: AMPUTATION BELOW KNEE;  Surgeon: Nadara Mustard, MD;  Location: [facility] OR;  Service: Orthopedics;  Laterality: Left;   AMPUTATION Left 01/29/2023   Procedure: LEFT AMPUTATION BELOW KNEE REVISION AND CLOSURE;  Surgeon: Lajoyce Corners,  Randa Evens, MD;  Location: [facility] OR;  Service: Orthopedics;  Laterality: Left;   AORTIC VALVE REPLACEMENT N/A 12/05/2019   Procedure: PARTIAL STERNOTOMY FOR AORTIC VALVE REPLACEMENT (AVR), USING CARBOMEDICS SUPRA-ANNULAR TOP HAT ;  Surgeon: Purcell Nails, MD;  Location: [facility] OR;  Service: Open Heart Surgery;  Laterality: N/A;  No neck lines on left   APPLICATION OF WOUND VAC Left 10/30/2021   Procedure: APPLICATION OF  WOUND VAC;  Surgeon: Nadara Mustard, MD;  Location: [facility] OR;  Service: Orthopedics;  Laterality: Left;   APPLICATION OF WOUND VAC Left 12/25/2021   Procedure: APPLICATION OF WOUND VAC;  Surgeon: Nadara Mustard, MD;  Location: [facility] OR;  Service: Orthopedics;  Laterality: Left;   APPLICATION OF WOUND VAC Left 01/26/2023   Procedure: APPLICATION OF WOUND VAC TO LEFT STUMP;  Surgeon: Tarry Kos, MD;  Location: [facility] OR;  Service: Orthopedics;  Laterality: Left;   CARDIAC VALVE REPLACEMENT N/A    Phreesia 09/27/2020   I & D EXTREMITY Left 01/26/2023   Procedure: IRRIGATION AND DEBRIDEMENT OF LEFT BKA STUMP;  Surgeon: Tarry Kos, MD;  Location: [facility] OR;  Service: Orthopedics;  Laterality: Left;   IR RADIOLOGY PERIPHERAL GUIDED IV START  10/05/2019   IR US GUIDE VASC ACCESS RIGHT  10/05/2019   MULTIPLE EXTRACTIONS WITH ALVEOLOPLASTY N/A 10/26/2019   Procedure: EXTRACTION OF TOOTH #'S 3, 5-11,19-28,  AND 32 WITH ALVEOLOPLASTY;  Surgeon: Charlynne Pander, DDS;  Location: [facility] OR;  Service: Oral Surgery;  Laterality: N/A;   RIGHT HEART CATH AND CORONARY ANGIOGRAPHY N/A 09/24/2019   Procedure: RIGHT HEART CATH AND CORONARY ANGIOGRAPHY;  Surgeon: Tonny Bollman, MD;  Location: [facility] INVASIVE CV LAB;  Service: Cardiovascular;  Laterality: N/A;   STUMP REVISION Left 10/30/2021   Procedure: REVISION LEFT BELOW KNEE AMPUTATION;  Surgeon: Nadara Mustard, MD;  Location: [facility] OR;  Service: Orthopedics;  Laterality: Left;   STUMP REVISION Left 12/25/2021   Procedure: REVISION LEFT BELOW KNEE AMPUTATION;  Surgeon: Nadara Mustard, MD;  Location: [facility] OR;  Service: Orthopedics;  Laterality: Left;   TEE WITHOUT CARDIOVERSION N/A 12/05/2019   Procedure: TRANSESOPHAGEAL ECHOCARDIOGRAM (TEE);  Surgeon: Purcell Nails, MD;  Location: [facility] OR;  Service: Open Heart Surgery;  Laterality: N/A;   TEE WITHOUT CARDIOVERSION N/A 09/01/2021   Procedure: TRANSESOPHAGEAL ECHOCARDIOGRAM (TEE);  Surgeon: Little Ishikawa, MD;  Location: [facility] ENDOSCOPY;   Service: Cardiovascular;  Laterality: N/A;     reports that he has never smoked. He quit smokeless tobacco use about 3 years ago.  His smokeless tobacco use included chew. He reports current alcohol use of about 4.0 standard drinks of alcohol per week. He reports that he does not use drugs.  No Known Allergies  Family History  Problem Relation Age of Onset   Cancer Mother        Brain tumor   Heart disease Father    Hyperlipidemia Father    Hypertension Father    Stroke Father    Heart murmur Sister    Heart attack Maternal Grandmother    Liver disease Maternal Grandfather    Breast cancer Paternal Grandmother    COPD Paternal Grandfather    Heart attack Maternal Aunt    Colon cancer Maternal Uncle 67   Pancreatic cancer Paternal Aunt    Heart disease Paternal Uncle    Prostate cancer Neg Hx     Prior to Admission medications   Medication Sig Start Date End Date Taking? Authorizing Provider  Continuous Glucose Sensor (FREESTYLE LIBRE 3 SENSOR) MISC Place 1 sensor on the skin every 14 days. Use to check glucose continuously 02/28/23   [name], DO  ezetimibe (ZETIA) 10 MG tablet Take 1 tablet (10 mg total) by mouth daily. 10/12/22  Yes Cook, Jayce G, DO  gabapentin (NEURONTIN) 300 MG capsule Take 2 capsules (600 mg total) by mouth 2 (two) times daily. 02/25/23  Yes Cook, Jayce G, DO  insulin glargine (LANTUS) 100 UNIT/ML Solostar Pen Inject 30 Units into the skin daily. 02/04/23  Yes Rhetta Mura, MD  lisinopril (ZESTRIL) 20 MG tablet Take 1 tablet (20 mg total) by mouth daily. 10/12/22  Yes Cook, Jayce G, DO  metFORMIN (GLUCOPHAGE) 500 MG tablet Take 2 tablets (1,000 mg total) by mouth 2 (two) times daily with a meal. 10/12/22  Yes Cook, Jayce G, DO  metoprolol tartrate (LOPRESSOR) 25 MG tablet Take 1 tablet (25 mg total) by mouth 2 (two) times daily. 10/12/22  Yes Cook, Jayce G, DO  pantoprazole (PROTONIX) 40 MG tablet Take 1 tablet (40 mg total) by mouth daily. 10/12/22  Yes  Cook, Jayce G, DO  sertraline (ZOLOFT) 50 MG tablet Take 1 tablet (50 mg total) by mouth daily. 10/12/22  Yes Cook, Jayce G, DO  warfarin (COUMADIN) 10 MG tablet TAKE 1 TABLET BY MOUTH DAILY EXCEPT TAKE  ONE-HALF TABLET  ON MONDAYS, WEDNESDAYS AND FRIDAYS OR AS DIRECTED Patient taking differently: Take 5-10 mg by mouth See admin instructions. TAKE 1 TABLET BY MOUTH DAILY EXCEPT TAKE  ONE-HALF TABLET  ON MONDAYS, WEDNESDAYS AND FRIDAYS OR AS DIRECTED 02/28/23  Yes Jonelle Sidle, MD  Zinc Sulfate 220 (50 Zn) MG TABS Take 1 tablet (220 mg total) by mouth daily. 02/04/23  Yes Rhetta Mura, MD  Insulin Pen Needle 32G X 4 MM MISC Use with Lantus flexpen 02/03/23   Rhetta Mura, MD  methocarbamol (ROBAXIN) 500 MG tablet Take 1 tablet (500 mg total) by mouth every 6 (six) hours as needed for muscle spasms. Patient not taking: Reported on 07/25/2023 02/03/23   Rhetta Mura, MD  oxyCODONE (OXY IR/ROXICODONE) 5 MG immediate release tablet Take 1-2 tablets (5-10 mg total) by mouth every 4 (four) hours as needed for moderate pain (pain score 4-6). Patient not taking: Reported on 07/25/2023 02/03/23   Rhetta Mura, MD    Physical Exam: Vitals:   07/25/23 1512 07/25/23 1909 07/25/23 2030 07/25/23 2109  BP: (!) 144/78 (!) 144/74 138/69   Pulse: 67 67 72   Resp: 19 18 17    Temp: 97.8 F (36.6 C) 98.4 F (36.9 C)    TempSrc: Oral     SpO2: 100% 100% 100%   Weight:    (!) 165.6 kg  Height:    5\' 10"  (1.778 m)    Physical Exam Vitals reviewed.  Constitutional:      General: He is not in acute distress. HENT:     Head: Normocephalic and atraumatic.  Eyes:     Extraocular Movements: Extraocular movements intact.  Cardiovascular:     Rate and Rhythm: Normal rate and regular rhythm.     Pulses: Normal pulses.  Pulmonary:     Effort: Pulmonary effort is normal. No respiratory distress.     Breath sounds: Normal breath sounds. No wheezing or rales.  Abdominal:     General:  Bowel sounds are normal. There is no distension.     Palpations: Abdomen is soft.     Tenderness: There is no abdominal tenderness.  There is no guarding.  Musculoskeletal:     Cervical back: Normal range of motion.     Comments: Left BKA stump site erythematous with purulent drainage  Skin:    General: Skin is warm and dry.  Neurological:     General: No focal deficit present.     Mental Status: He is alert and oriented to person, place, and time.       Labs on Admission: I have personally reviewed following labs and imaging studies  CBC: Recent Labs  Lab 07/25/23 1544  WBC 8.6  NEUTROABS 6.3  HGB 8.1*  HCT 28.4*  MCV 73.8*  PLT 390   Basic Metabolic Panel: Recent Labs  Lab 07/25/23 1544  NA 128*  K 4.2  CL 98  CO2 22  GLUCOSE 293*  BUN 14  CREATININE 0.85  CALCIUM 8.2*   GFR: Estimated Creatinine Clearance: 167.2 mL/min (by C-G formula based on SCr of 0.85 mg/dL). Liver Function Tests: Recent Labs  Lab 07/25/23 1544  AST 24  ALT 23  ALKPHOS 53  BILITOT 0.6  PROT 7.3  ALBUMIN 2.6*   No results for input(s): "LIPASE", "AMYLASE" in the last 168 hours. No results for input(s): "AMMONIA" in the last 168 hours. Coagulation Profile: No results for input(s): "INR", "PROTIME" in the last 168 hours. Cardiac Enzymes: No results for input(s): "CKTOTAL", "CKMB", "CKMBINDEX", "TROPONINI" in the last 168 hours. BNP (last 3 results) No results for input(s): "PROBNP" in the last 8760 hours. HbA1C: No results for input(s): "HGBA1C" in the last 72 hours. CBG: No results for input(s): "GLUCAP" in the last 168 hours. Lipid Profile: No results for input(s): "CHOL", "HDL", "LDLCALC", "TRIG", "CHOLHDL", "LDLDIRECT" in the last 72 hours. Thyroid Function Tests: No results for input(s): "TSH", "T4TOTAL", "FREET4", "T3FREE", "THYROIDAB" in the last 72 hours. Anemia Panel: No results for input(s): "VITAMINB12", "FOLATE", "FERRITIN", "TIBC", "IRON", "RETICCTPCT" in the  last 72 hours. Urine analysis:    Component Value Date/Time   COLORURINE YELLOW 07/25/2023 1533   APPEARANCEUR CLOUDY (A) 07/25/2023 1533   LABSPEC 1.018 07/25/2023 1533   PHURINE 5.0 07/25/2023 1533   GLUCOSEU >=500 (A) 07/25/2023 1533   HGBUR LARGE (A) 07/25/2023 1533   BILIRUBINUR NEGATIVE 07/25/2023 1533   KETONESUR NEGATIVE 07/25/2023 1533   PROTEINUR 30 (A) 07/25/2023 1533   UROBILINOGEN 0.2 08/31/2011 1727   NITRITE NEGATIVE 07/25/2023 1533   LEUKOCYTESUR TRACE (A) 07/25/2023 1533    Radiological Exams on Admission: DG Knee 2 Views Left  Result Date: 07/25/2023 CLINICAL DATA:  Wound infection, diabetes EXAM: LEFT KNEE - 1-2 VIEW COMPARISON:  01/23/2023 FINDINGS: Frontal and lateral views of the left knee are obtained. Prior below-knee amputation. No acute fracture, subluxation, or dislocation. Joint spaces are well preserved. There is diffuse subcutaneous edema. No evidence of subcutaneous gas or radiopaque foreign body. No evidence of bony erosion or periosteal reaction to suggest acute osteomyelitis. IMPRESSION: 1. Diffuse soft tissue swelling consistent with cellulitis. 2. No evidence of bony destruction or periosteal reaction to suggest acute osteomyelitis. The appearance of the below-knee amputation site is similar to prior CT exam. Electronically Signed   By: Sharlet Salina M.D.   On: 07/25/2023 19:12   DG Chest 2 View  Result Date: 07/25/2023 CLINICAL DATA:  Diabetes, hypertension, wound infection EXAM: CHEST - 2 VIEW COMPARISON:  02/06/2023 FINDINGS: Frontal and lateral views of the chest demonstrate stable enlargement of the cardiac silhouette. No airspace disease, effusion, or pneumothorax. No acute bony abnormalities. IMPRESSION: 1. Stable chest, no  acute process. Electronically Signed   By: Sharlet Salina M.D.   On: 07/25/2023 19:10    Assessment and Plan  Left BKA stump infection/cellulitis Patient presenting with signs of stump site infection including erythema  and purulent drainage.  X-ray showing diffuse soft tissue swelling consistent with cellulitis but no evidence of acute osteomyelitis.  No fever, leukocytosis, or lactic acidosis.  No signs of sepsis.  Continue vancomycin and ceftriaxone.  Follow-up blood cultures.  Trend WBC count.  Consult orthopedics in the morning.  Chronic anemia Hemoglobin 8.1, close to baseline.  No signs of bleeding.  Type and screen, monitor H&H.  Acute on chronic hyponatremia Sodium currently 128, previously in the low 130s.  Hold ACE inhibitor at this time and repeat labs in the morning.  Check serum osmolarity.  Poorly controlled insulin-dependent type 2 diabetes Glucose 293.  Last A1c 13.4 in April 2024, repeat ordered.  Continue home basal insulin.  Moderate sliding scale insulin ACHS ordered.  ?UTI UA with negative nitrite, trace leukocytes, and microscopy showing >50 RBCs, >50 WBCs, and few bacteria.  Patient is not endorsing any urinary symptoms, vomiting, or abdominal/flank pain at present.  Already on antibiotics for stump site infection/cellulitis.  Urine culture pending.  Microscopic hematuria CT done 06/28/2023 showing nonobstructing left renal stones.  Patient denies any abdominal or flank pain at present.  CAD Not endorsing any anginal symptoms.  Hypertension SBP 130-140s. Continue metoprolol.  Hyperlipidemia Continue Zetia.  Mechanical aortic valve Check PT/INR.  Coumadin dosing per pharmacy.  Anxiety and depression Continue Zoloft.  Chronic pain/neuropathy Continue gabapentin.  GERD Continue Protonix.  DVT prophylaxis: Coumadin Code Status: Full Code (discussed with the patient) Level of care: Med-Surg Admission status: It is my clinical opinion that admission to INPATIENT is reasonable and necessary because of the expectation that this patient will require hospital care that crosses at least 2 midnights to treat this condition based on the medical complexity of the problems presented.   Given the aforementioned information, the predictability of an adverse outcome is felt to be significant.  John Giovanni MD [facility]  If 7PM-7AM, please contact night-coverage [email or web address]  07/25/2023, 9:19 PM

## 2023-07-25 NOTE — ED Triage Notes (Signed)
Pt here from home with c/o possible infection /wound to his left stump , warm to touch with some drainage

## 2023-07-25 NOTE — ED Provider Notes (Signed)
[place] [facility]ERG[facility] DE[facility]MENT AT [facility] Provider Note   CSN: [id number] Arrival date & time: 07/25/23  1501     History  Chief Complaint  Patient presents with   Wound Infection    Adam Long is a 47 y.o. male with PMH diabetes, HTN, aortic valve replacement on warfarin, L BKA s/p revision due to dehiscence (Dr. Lajoyce Corners 09/01/2021 original surgery; revisions in February and march of 2023, 01/29/2023) presenting for wound infection of his amputation site.  Patient was otherwise at his baseline using his prosthetic when 2 to 3 days ago he noted what he describes as a knot to the lateral aspect of the BKA incision site.  It then progressed to redness, pain, drainage.  He is also had subjective fevers but has not checked his temperature at home.  Denies any other symptoms.  The history is provided by the patient and medical records.       Home Medications Prior to Admission medications   Medication Sig Start Date End Date Taking? Authorizing Provider  Continuous Glucose Sensor (FREESTYLE LIBRE 3 SENSOR) MISC Place 1 sensor on the skin every 14 days. Use to check glucose continuously 02/28/23   [name], DO  DAPTOmycin (CUBICIN) 500 MG injection Inject 500 mg into the vein. 03/16/23   [provider]  ezetimibe (ZETIA) 10 MG tablet Take 1 tablet (10 mg total) by mouth daily. 10/12/22   [name], DO  gabapentin (NEURONTIN) 300 MG capsule Take 2 capsules (600 mg total) by mouth 2 (two) times daily. 02/25/23   [name], DO  insulin glargine (LANTUS) 100 UNIT/ML Solostar Pen Inject 30 Units into the skin daily. 02/04/23   Rhetta Mura, MD  Insulin Pen Needle 32G X 4 MM MISC Use with Lantus flexpen 02/03/23   Rhetta Mura, MD  lisinopril (ZESTRIL) 20 MG tablet Take 1 tablet (20 mg total) by mouth daily. 10/12/22   [name], DO  metFORMIN (GLUCOPHAGE) 500 MG tablet Take 2 tablets (1,000 mg total) by mouth 2 (two) times daily with a meal.  10/12/22   Cook, Verdis Frederickson, DO  methocarbamol (ROBAXIN) 500 MG tablet Take 1 tablet (500 mg total) by mouth every 6 (six) hours as needed for muscle spasms. 02/03/23   Rhetta Mura, MD  metoprolol tartrate (LOPRESSOR) 25 MG tablet Take 1 tablet (25 mg total) by mouth 2 (two) times daily. 10/12/22   [name], DO  oxyCODONE (OXY IR/ROXICODONE) 5 MG immediate release tablet Take 1-2 tablets (5-10 mg total) by mouth every 4 (four) hours as needed for moderate pain (pain score 4-6). 02/03/23   Rhetta Mura, MD  pantoprazole (PROTONIX) 40 MG tablet Take 1 tablet (40 mg total) by mouth daily. 10/12/22   [name], DO  sertraline (ZOLOFT) 50 MG tablet Take 1 tablet (50 mg total) by mouth daily. 10/12/22   [name], DO  warfarin (COUMADIN) 10 MG tablet TAKE 1 TABLET BY MOUTH DAILY EXCEPT TAKE  ONE-HALF TABLET  ON MONDAYS, WEDNESDAYS AND FRIDAYS OR AS DIRECTED 02/28/23   Jonelle Sidle, MD  Zinc Sulfate 220 (50 Zn) MG TABS Take 1 tablet (220 mg total) by mouth daily. 02/04/23   Rhetta Mura, MD      Allergies    Patient has no known allergies.    Review of Systems   Review of Systems  Physical Exam Updated Vital Signs BP 138/69   Pulse 72   Temp 98.4 F (36.9 C)  Resp 17   SpO2 100%  Physical Exam Constitutional:      Appearance: Normal appearance.  HENT:     Head: Normocephalic and atraumatic.     Mouth/Throat:     Mouth: Mucous membranes are moist.  Eyes:     Extraocular Movements: Extraocular movements intact.     Pupils: Pupils are equal, round, and reactive to light.  Cardiovascular:     Rate and Rhythm: Normal rate and regular rhythm.     Pulses: Normal pulses.     Heart sounds: Normal heart sounds.  Abdominal:     General: There is no distension.     Palpations: Abdomen is soft.     Tenderness: There is no abdominal tenderness. There is no guarding.  Musculoskeletal:     Comments: L BKA site with mild dehiscence to the lateral aspect and foul  smelling purulent drainage as shown in photo. Warm to the touch. No fluctuance.   Skin:    General: Skin is warm and dry.  Neurological:     General: No focal deficit present.     Mental Status: He is alert.     Sensory: No sensory deficit.     Motor: No weakness.      ED Results / Procedures / Treatments   Labs (all labs ordered are listed, but only abnormal results are displayed) Labs Reviewed  COMPREHENSIVE METABOLIC PANEL - Abnormal; Notable for the following components:      Result Value   Sodium 128 (*)    Glucose, Bld 293 (*)    Calcium 8.2 (*)    Albumin 2.6 (*)    All other components within normal limits  CBC WITH DIFFERENTIAL/PLATELET - Abnormal; Notable for the following components:   RBC 3.85 (*)    Hemoglobin 8.1 (*)    HCT 28.4 (*)    MCV 73.8 (*)    MCH 21.0 (*)    MCHC 28.5 (*)    RDW 18.7 (*)    Abs Immature Granulocytes 0.48 (*)    All other components within normal limits  URINALYSIS, W/ REFLEX TO CULTURE (INFECTION SUSPECTED) - Abnormal; Notable for the following components:   APPearance CLOUDY (*)    Glucose, UA >=500 (*)    Hgb urine dipstick LARGE (*)    Protein, ur 30 (*)    Leukocytes,Ua TRACE (*)    Bacteria, UA FEW (*)    All other components within normal limits  CULTURE, BLOOD (ROUTINE X 2)  CULTURE, BLOOD (ROUTINE X 2)  URINE CULTURE  I-STAT CG4 LACTIC ACID, ED  I-STAT CG4 LACTIC ACID, ED    EKG None  Radiology DG Knee 2 Views Left  Result Date: 07/25/2023 CLINICAL DATA:  Wound infection, diabetes EXAM: LEFT KNEE - 1-2 VIEW COMPARISON:  01/23/2023 FINDINGS: Frontal and lateral views of the left knee are obtained. Prior below-knee amputation. No acute fracture, subluxation, or dislocation. Joint spaces are well preserved. There is diffuse subcutaneous edema. No evidence of subcutaneous gas or radiopaque foreign body. No evidence of bony erosion or periosteal reaction to suggest acute osteomyelitis. IMPRESSION: 1. Diffuse soft tissue  swelling consistent with cellulitis. 2. No evidence of bony destruction or periosteal reaction to suggest acute osteomyelitis. The appearance of the below-knee amputation site is similar to prior CT exam. Electronically Signed   By: Sharlet Salina M.D.   On: 07/25/2023 19:12   DG Chest 2 View  Result Date: 07/25/2023 CLINICAL DATA:  Diabetes, hypertension, wound infection EXAM: CHEST - 2  VIEW COMPARISON:  02/06/2023 FINDINGS: Frontal and lateral views of the chest demonstrate stable enlargement of the cardiac silhouette. No airspace disease, effusion, or pneumothorax. No acute bony abnormalities. IMPRESSION: 1. Stable chest, no acute process. Electronically Signed   By: Sharlet Salina M.D.   On: 07/25/2023 19:10    Procedures Procedures    Medications Ordered in ED Medications  vancomycin (VANCOREADY) IVPB 1500 mg/300 mL (has no administration in time range)  cefTRIAXone (ROCEPHIN) 2 g in sodium chloride 0.9 % 100 mL IVPB (has no administration in time range)    ED Course/ Medical Decision Making/ A&P                                 Medical Decision Making Amount and/or Complexity of Data Reviewed External Data Reviewed: notes.    Details: Op notes Labs: ordered. Decision-making details documented in ED Course. Radiology: ordered and independent interpretation performed. Decision-making details documented in ED Course.  Risk Prescription drug management. Decision regarding hospitalization.   47 year old male presenting with left BKA site infection.  Differential considered includes cellulitis, abscess, osteomyelitis, sepsis, necrotizing infection. On exam there is evidence of cellulitis and drainage.  He is hemodynamically stable and not septic. X rays of the BKA site were ordered and do not show evidence of osteomyelitis or gas. Labs were ordered and notable for hemoglobin 8.1 near prior values, no leukocytosis, hyperglycemia of 293 with normal anion gap and bicarb, hyponatremia of  128 though when corrected for hyperglycemia is 133; normal renal and liver function, normal lactate, UA with ?UTI. Blood cultures in process.   Vancomycin ordered for wound infection. CTX for UTI with culture in process. Morphine, Zofran given for pain and nausea prevention respectively.   Given high risk for decompensation and requirement for IV antibiotics and surgical consultation likely surgery to follow, he was admitted to hospitalist for IV antibiotics and ortho consult in AM.           Final Clinical Impression(s) / ED Diagnoses Final diagnoses:  Wound infection    Rx / DC Orders ED Discharge Orders     None         Karmen Stabs, MD 07/25/23 2324    Melene Plan, DO 07/25/23 2332

## 2023-07-25 NOTE — Progress Notes (Incomplete)
PHARMACY ANTIBIOTIC CONSULT NOTE   Adam Long a 47 y.o. male with history of chronic wound issues s/p L BKA with multiple revisions presenting with L BKA stump infection, Xray revealing soft tissue swelling c/w cellulitis, no evidence of osteomyelitis.  He is morbidly obese (BMI 53).  Pharmacy has been consulted for Vancomycin dosing.  Wound cultures in 2023 showed ESBL klebsiella, enterococcus faecalis, clostridium perfringens, e.coli, proteus. He also had MSSA and group B strep bacteremia.  His wound cultures in April/May of 2024 grew MRSA.   07/25/23: Scr 0.85, WBC 8.6  Afebrile  Estimated Creatinine Clearance: 169.6 mL/min (by C-G formula based on SCr of 0.85 mg/dL).  Plan: Rocephin 2gm IV Q24h per MD START vancomycin 2000 mg LD x1, then 1500 mg IV Q12h (eAUC 489) Monitor renal function, clinical status, de-escalation, C/S, levels as indicated   Allergies:  No Known Allergies  Filed Weights   07/25/23 2109 07/25/23 2215  Weight: (!) 165.6 kg (365 lb) (!) 169.5 kg (373 lb 10.9 oz)       Latest Ref Rng & Units 07/25/2023    3:44 PM 06/27/2023    9:00 PM 02/17/2023    2:49 AM  CBC  WBC 4.0 - 10.5 K/uL 8.6  11.9  8.3   Hemoglobin 13.0 - 17.0 g/dL 8.1  8.8  9.3   Hematocrit 39.0 - 52.0 % 28.4  28.8  29.1   Platelets 150 - 400 K/uL 390  239  236     Antibiotics Given (last 72 hours)     Date/Time Action Medication Dose Rate   07/25/23 2116 New Bag/Given   cefTRIAXone (ROCEPHIN) 2 g in sodium chloride 0.9 % 100 mL IVPB 2 g 200 mL/hr       Antimicrobials this admission: Vancomycin 10/28 >> c Rocephin 10/28 >> c  Microbiology results: 10/28 Bcx: sent 10/28 Ucx: sent   Thank you for allowing pharmacy to be a part of this patient's care.  Trixie Rude, PharmD Clinical Pharmacist 07/25/2023  10:43 PM

## 2023-07-25 NOTE — ED Notes (Signed)
ED TO INPATIENT HANDOFF REPORT  ED Nurse Name and Phone #: Jeanice Lim [phone]  S Name/Age/Gender Adam Long 47 y.o. male Room/Bed: 017C/017C  Code Status   Code Status: Prior  Home/SNF/Other Home Patient oriented to: self, place, time, and situation Is this baseline? Yes   Triage Complete: Triage complete  Chief Complaint Cellulitis [L03.90]  Triage Note Pt here from home with c/o possible infection /wound to his left stump , warm to touch with some drainage    Allergies No Known Allergies  Level of Care/Admitting Diagnosis ED Disposition     ED Disposition  Admit   Condition  --   Comment  Hospital Area: [facility] [[id number]]  Level of Care: Med-Surg [16]  May admit patient to [facility] or [facility] if equivalent level of care is available:: Yes  Covid Evaluation: Asymptomatic - no recent exposure (last 10 days) testing not required  Diagnosis: Cellulitis [454098]  Admitting Physician: John Giovanni [[id number]]  Attending Physician: John Giovanni [[id number]]  Certification:: I certify this patient will need inpatient services for at least 2 midnights  Expected Medical Readiness: 07/27/2023          B Medical/Surgery History Past Medical History:  Diagnosis Date   Anxiety    Aortic stenosis    Cellulitis and abscess of lower extremity 06/11/2019   Cellulitis of fourth toe of left foot    Cholelithiasis    Coronary artery disease    Nonobstructive CAD (40-50% LAD) 08/2019   Depression    Diabetes mellitus without complication (HCC)    Phreesia 09/27/2020   Elevated troponin level not due myocardial infarction 11/11/2019   Essential hypertension    Gangrene of toe of left foot (HCC) 07/06/2019   Heart murmur    Phreesia 09/27/2020   Hyperlipidemia    Phreesia 09/27/2020   Hypertension    Phreesia 09/27/2020   Mixed hyperlipidemia    Morbid obesity (HCC)    S/P aortic valve replacement with mechanical valve 12/05/2019    25 mm Carbomedics top hat bileaflet mechanical valve via partial upper hemi-sternotomy   Severe aortic stenosis 09/24/2019   Type 2 diabetes mellitus (HCC)    Past Surgical History:  Procedure Laterality Date   ABDOMINAL AORTOGRAM W/LOWER EXTREMITY N/A 07/06/2019   Procedure: ABDOMINAL AORTOGRAM W/LOWER EXTREMITY;  Surgeon: Sherren Kerns, MD;  Location: [facility] INVASIVE CV LAB;  Service: Cardiovascular;  Laterality: N/A;  Bilateral   AMPUTATION Left 07/09/2019   Procedure: LEFT FOURTH and Fifth TOE AMPUTATION.;  Surgeon: Larina Earthly, MD;  Location: [facility] OR;  Service: Vascular;  Laterality: Left;   AMPUTATION Left 03/11/2021   Procedure: LEFT FOOT 5TH  AND 4TH RAY AMPUTATION;  Surgeon: Nadara Mustard, MD;  Location: [facility] OR;  Service: Orthopedics;  Laterality: Left;   AMPUTATION Left 08/28/2021   Procedure: AMPUTATION BELOW KNEE;  Surgeon: Nadara Mustard, MD;  Location: [facility] OR;  Service: Orthopedics;  Laterality: Left;   AMPUTATION Left 01/29/2023   Procedure: LEFT AMPUTATION BELOW KNEE REVISION AND CLOSURE;  Surgeon: Nadara Mustard, MD;  Location: [facility] OR;  Service: Orthopedics;  Laterality: Left;   AORTIC VALVE REPLACEMENT N/A 12/05/2019   Procedure: PARTIAL STERNOTOMY FOR AORTIC VALVE REPLACEMENT (AVR), USING CARBOMEDICS SUPRA-ANNULAR TOP HAT ;  Surgeon: Purcell Nails, MD;  Location: [facility] OR;  Service: Open Heart Surgery;  Laterality: N/A;  No neck lines on left   APPLICATION OF WOUND VAC Left 10/30/2021   Procedure: APPLICATION OF WOUND VAC;  Surgeon: Nadara Mustard, MD;  Location: [facility] OR;  Service: Orthopedics;  Laterality: Left;   APPLICATION OF WOUND VAC Left 12/25/2021   Procedure: APPLICATION OF WOUND VAC;  Surgeon: Nadara Mustard, MD;  Location: [facility] OR;  Service: Orthopedics;  Laterality: Left;   APPLICATION OF WOUND VAC Left 01/26/2023   Procedure: APPLICATION OF WOUND VAC TO LEFT STUMP;  Surgeon: Tarry Kos, MD;  Location: [facility] OR;  Service: Orthopedics;  Laterality: Left;   CARDIAC VALVE  REPLACEMENT N/A    Phreesia 09/27/2020   I & D EXTREMITY Left 01/26/2023   Procedure: IRRIGATION AND DEBRIDEMENT OF LEFT BKA STUMP;  Surgeon: Tarry Kos, MD;  Location: [facility] OR;  Service: Orthopedics;  Laterality: Left;   IR RADIOLOGY PERIPHERAL GUIDED IV START  10/05/2019   IR US GUIDE VASC ACCESS RIGHT  10/05/2019   MULTIPLE EXTRACTIONS WITH ALVEOLOPLASTY N/A 10/26/2019   Procedure: EXTRACTION OF TOOTH #'S 3, 5-11,19-28,  AND 32 WITH ALVEOLOPLASTY;  Surgeon: Charlynne Pander, DDS;  Location: [facility] OR;  Service: Oral Surgery;  Laterality: N/A;   RIGHT HEART CATH AND CORONARY ANGIOGRAPHY N/A 09/24/2019   Procedure: RIGHT HEART CATH AND CORONARY ANGIOGRAPHY;  Surgeon: Tonny Bollman, MD;  Location: [facility] INVASIVE CV LAB;  Service: Cardiovascular;  Laterality: N/A;   STUMP REVISION Left 10/30/2021   Procedure: REVISION LEFT BELOW KNEE AMPUTATION;  Surgeon: Nadara Mustard, MD;  Location: [facility] OR;  Service: Orthopedics;  Laterality: Left;   STUMP REVISION Left 12/25/2021   Procedure: REVISION LEFT BELOW KNEE AMPUTATION;  Surgeon: Nadara Mustard, MD;  Location: [facility] OR;  Service: Orthopedics;  Laterality: Left;   TEE WITHOUT CARDIOVERSION N/A 12/05/2019   Procedure: TRANSESOPHAGEAL ECHOCARDIOGRAM (TEE);  Surgeon: Purcell Nails, MD;  Location: [facility] OR;  Service: Open Heart Surgery;  Laterality: N/A;   TEE WITHOUT CARDIOVERSION N/A 09/01/2021   Procedure: TRANSESOPHAGEAL ECHOCARDIOGRAM (TEE);  Surgeon: Little Ishikawa, MD;  Location: [facility] ENDOSCOPY;  Service: Cardiovascular;  Laterality: N/A;     A IV Location/Drains/Wounds Patient Lines/Drains/Airways Status     Active Line/Drains/Airways     Name Placement date Placement time Site Days   Peripheral IV 07/25/23 20 G Left Antecubital 07/25/23  2100  Antecubital  less than 1   Wound / Incision (Open or Dehisced) 02/08/23 Knee Anterior;Right;Left 02/08/23  0700  Knee  167            Intake/Output Last 24 hours No intake or output data in the 24  hours ending 07/25/23 2124  Labs/Imaging Results for orders placed or performed during the hospital encounter of 07/25/23 (from the past 48 hour(s))  Urinalysis, w/ Reflex to Culture (Infection Suspected) -Urine, Clean Catch     Status: Abnormal   Collection Time: 07/25/23  3:33 PM  Result Value Ref Range   Specimen Source URINE, CATHETERIZED    Color, Urine YELLOW YELLOW   APPearance CLOUDY (A) CLEAR   Specific Gravity, Urine 1.018 1.005 - 1.030   pH 5.0 5.0 - 8.0   Glucose, UA >=500 (A) NEGATIVE mg/dL   Hgb urine dipstick LARGE (A) NEGATIVE   Bilirubin Urine NEGATIVE NEGATIVE   Ketones, ur NEGATIVE NEGATIVE mg/dL   Protein, ur 30 (A) NEGATIVE mg/dL   Nitrite NEGATIVE NEGATIVE   Leukocytes,Ua TRACE (A) NEGATIVE   RBC / HPF >50 0 - 5 RBC/hpf   WBC, UA >50 0 - 5 WBC/hpf    Comment:        Reflex urine culture not performed if  WBC <=10, OR if Squamous epithelial cells >5. If Squamous epithelial cells >5 suggest recollection.    Bacteria, UA FEW (A) NONE SEEN   Squamous Epithelial / HPF 0-5 0 - 5 /HPF   Mucus PRESENT    Uric Acid Crys, UA PRESENT     Comment: Performed at [facility], [address] [place], [place] [address]  Comprehensive metabolic panel     Status: Abnormal   Collection Time: 07/25/23  3:44 PM  Result Value Ref Range   Sodium 128 (L) 135 - 145 mmol/L   Potassium 4.2 3.5 - 5.1 mmol/L   Chloride 98 98 - 111 mmol/L   CO2 22 22 - 32 mmol/L   Glucose, Bld 293 (H) 70 - 99 mg/dL    Comment: Glucose reference range applies only to samples taken after fasting for at least 8 hours.   BUN 14 6 - 20 mg/dL   Creatinine, Ser 6.04 0.61 - 1.24 mg/dL   Calcium 8.2 (L) 8.9 - 10.3 mg/dL   Total Protein 7.3 6.5 - 8.1 g/dL   Albumin 2.6 (L) 3.5 - 5.0 g/dL   AST 24 15 - 41 U/L   ALT 23 0 - 44 U/L   Alkaline Phosphatase 53 38 - 126 U/L   Total Bilirubin 0.6 0.3 - 1.2 mg/dL   GFR, Estimated >54 >09 mL/min    Comment: (NOTE) Calculated using the CKD-EPI Creatinine  Equation (2021)    Anion gap 8 5 - 15    Comment: Performed at [facility], [address], [place], [place] [address]  CBC with Differential     Status: Abnormal   Collection Time: 07/25/23  3:44 PM  Result Value Ref Range   WBC 8.6 4.0 - 10.5 K/uL   RBC 3.85 (L) 4.22 - 5.81 MIL/uL   Hemoglobin 8.1 (L) 13.0 - 17.0 g/dL    Comment: Reticulocyte Hemoglobin testing may be clinically indicated, consider ordering this additional test [id number]    HCT 28.4 (L) 39.0 - 52.0 %   MCV 73.8 (L) 80.0 - 100.0 fL   MCH 21.0 (L) 26.0 - 34.0 pg   MCHC 28.5 (L) 30.0 - 36.0 g/dL   RDW 13.0 (H) 86.5 - 78.4 %   Platelets 390 150 - 400 K/uL   nRBC 0.2 0.0 - 0.2 %   Neutrophils Relative % 72 %   Neutro Abs 6.3 1.7 - 7.7 K/uL   Lymphocytes Relative 15 %   Lymphs Abs 1.3 0.7 - 4.0 K/uL   Monocytes Relative 5 %   Monocytes Absolute 0.5 0.1 - 1.0 K/uL   Eosinophils Relative 1 %   Eosinophils Absolute 0.1 0.0 - 0.5 K/uL   Basophils Relative 1 %   Basophils Absolute 0.0 0.0 - 0.1 K/uL   Immature Granulocytes 6 %   Abs Immature Granulocytes 0.48 (H) 0.00 - 0.07 K/uL    Comment: Performed at [facility], [address], [place], [place] [address]  I-Stat Lactic Acid, ED     Status: None   Collection Time: 07/25/23  4:03 PM  Result Value Ref Range   Lactic Acid, Venous 1.6 0.5 - 1.9 mmol/L  I-Stat Lactic Acid, ED     Status: None   Collection Time: 07/25/23  9:07 PM  Result Value Ref Range   Lactic Acid, Venous 1.7 0.5 - 1.9 mmol/L   DG Knee 2 Views Left  Result Date: 07/25/2023 CLINICAL DATA:  Wound infection, diabetes EXAM: LEFT KNEE -  1-2 VIEW COMPARISON:  01/23/2023 FINDINGS: Frontal and lateral views of the left knee are obtained. Prior below-knee amputation. No acute fracture, subluxation, or dislocation. Joint spaces are well preserved. There is diffuse subcutaneous edema. No evidence of subcutaneous gas or radiopaque foreign body. No evidence of bony erosion or periosteal  reaction to suggest acute osteomyelitis. IMPRESSION: 1. Diffuse soft tissue swelling consistent with cellulitis. 2. No evidence of bony destruction or periosteal reaction to suggest acute osteomyelitis. The appearance of the below-knee amputation site is similar to prior CT exam. Electronically Signed   By: Sharlet Salina M.D.   On: 07/25/2023 19:12   DG Chest 2 View  Result Date: 07/25/2023 CLINICAL DATA:  Diabetes, hypertension, wound infection EXAM: CHEST - 2 VIEW COMPARISON:  02/06/2023 FINDINGS: Frontal and lateral views of the chest demonstrate stable enlargement of the cardiac silhouette. No airspace disease, effusion, or pneumothorax. No acute bony abnormalities. IMPRESSION: 1. Stable chest, no acute process. Electronically Signed   By: Sharlet Salina M.D.   On: 07/25/2023 19:10    Pending Labs Unresulted Labs (From admission, onward)     Start     Ordered   07/25/23 1533  Culture, blood (Routine x 2)  BLOOD CULTURE X 2,   R (with STAT occurrences)      07/25/23 1533   07/25/23 1533  Urine Culture  Once,   R        07/25/23 1533            Vitals/Pain Today's Vitals   07/25/23 1909 07/25/23 2030 07/25/23 2109 07/25/23 2112  BP: (!) 144/74 138/69    Pulse: 67 72    Resp: 18 17    Temp: 98.4 F (36.9 C)     TempSrc:      SpO2: 100% 100%    Weight:   (!) 165.6 kg   Height:   5\' 10"  (1.778 m)   PainSc:    8     Isolation Precautions No active isolations  Medications Medications  vancomycin (VANCOREADY) IVPB 1500 mg/300 mL (has no administration in time range)  cefTRIAXone (ROCEPHIN) 2 g in sodium chloride 0.9 % 100 mL IVPB (2 g Intravenous New Bag/Given 07/25/23 2116)  morphine (PF) 4 MG/ML injection 4 mg (4 mg Intravenous Given 07/25/23 2112)  ondansetron (ZOFRAN) injection 4 mg (4 mg Intravenous Given 07/25/23 2110)    Mobility walks with device     Focused Assessments Skin red and warm to touch on left bka and right lower leg   R Recommendations: See  Admitting Provider Note  Report given to:   Additional Notes:

## 2023-07-26 DIAGNOSIS — T8740 Infection of amputation stump, unspecified extremity: Secondary | ICD-10-CM | POA: Diagnosis not present

## 2023-07-26 LAB — HEMOGLOBIN A1C
Hgb A1c MFr Bld: 10.2 % — ABNORMAL HIGH (ref 4.8–5.6)
Mean Plasma Glucose: 246.04 mg/dL

## 2023-07-26 LAB — BASIC METABOLIC PANEL
Anion gap: 9 (ref 5–15)
BUN: 11 mg/dL (ref 6–20)
CO2: 25 mmol/L (ref 22–32)
Calcium: 8.6 mg/dL — ABNORMAL LOW (ref 8.9–10.3)
Chloride: 100 mmol/L (ref 98–111)
Creatinine, Ser: 0.88 mg/dL (ref 0.61–1.24)
GFR, Estimated: >60 mL/min (ref >60)
Glucose, Bld: 274 mg/dL — ABNORMAL HIGH (ref 70–99)
Potassium: 4.4 mmol/L (ref 3.5–5.1)
Sodium: 134 mmol/L — ABNORMAL LOW (ref 135–145)

## 2023-07-26 LAB — CBC
HCT: 26 % — ABNORMAL LOW (ref 39.0–52.0)
Hemoglobin: 7.5 g/dL — ABNORMAL LOW (ref 13.0–17.0)
MCH: 21.1 pg — ABNORMAL LOW (ref 26.0–34.0)
MCHC: 28.8 g/dL — ABNORMAL LOW (ref 30.0–36.0)
MCV: 73.2 fL — ABNORMAL LOW (ref 80.0–100.0)
Platelets: 365 10*3/uL (ref 150–400)
RBC: 3.55 MIL/uL — ABNORMAL LOW (ref 4.22–5.81)
RDW: 18.6 % — ABNORMAL HIGH (ref 11.5–15.5)
WBC: 8.5 10*3/uL (ref 4.0–10.5)
nRBC: 0.4 % — ABNORMAL HIGH (ref 0.0–0.2)

## 2023-07-26 LAB — COMPREHENSIVE METABOLIC PANEL
ALT: 25 U/L (ref 0–44)
AST: 33 U/L (ref 15–41)
Albumin: 2.4 g/dL — ABNORMAL LOW (ref 3.5–5.0)
Alkaline Phosphatase: 49 U/L (ref 38–126)
Anion gap: 9 (ref 5–15)
BUN: 12 mg/dL (ref 6–20)
CO2: 23 mmol/L (ref 22–32)
Calcium: 8.6 mg/dL — ABNORMAL LOW (ref 8.9–10.3)
Chloride: 100 mmol/L (ref 98–111)
Creatinine, Ser: 1.12 mg/dL (ref 0.61–1.24)
GFR, Estimated: >60 mL/min (ref >60)
Glucose, Bld: 260 mg/dL — ABNORMAL HIGH (ref 70–99)
Potassium: 4.7 mmol/L (ref 3.5–5.1)
Sodium: 132 mmol/L — ABNORMAL LOW (ref 135–145)
Total Bilirubin: 0.4 mg/dL (ref 0.3–1.2)
Total Protein: 6.7 g/dL (ref 6.5–8.1)

## 2023-07-26 LAB — PREALBUMIN: Prealbumin: 17 mg/dL — ABNORMAL LOW (ref 18–38)

## 2023-07-26 LAB — GLUCOSE, CAPILLARY
Glucose-Capillary: 296 mg/dL — ABNORMAL HIGH (ref 70–99)
Glucose-Capillary: 253 mg/dL — ABNORMAL HIGH (ref 70–99)
Glucose-Capillary: 199 mg/dL — ABNORMAL HIGH (ref 70–99)
Glucose-Capillary: 236 mg/dL — ABNORMAL HIGH (ref 70–99)

## 2023-07-26 LAB — PROTIME-INR
INR: 2.6 — ABNORMAL HIGH (ref 0.8–1.2)
Prothrombin Time: 28.4 s — ABNORMAL HIGH (ref 11.4–15.2)

## 2023-07-26 MED ORDER — POVIDONE-IODINE 10 % EX SWAB
2.0000 | Freq: Once | CUTANEOUS | Status: AC
  Administered 2023-07-27: 2 via TOPICAL

## 2023-07-26 MED ORDER — VANCOMYCIN HCL 1750 MG/350ML IV SOLN
1750.0000 mg | Freq: Two times a day (BID) | INTRAVENOUS | Status: AC
  Administered 2023-07-26 – 2023-07-28 (×6): 1750 mg via INTRAVENOUS
  Filled 2023-07-26 (×6): qty 350

## 2023-07-26 MED ORDER — OXYCODONE HCL 5 MG PO TABS
5.0000 mg | ORAL_TABLET | Freq: Once | ORAL | Status: AC
  Administered 2023-07-26: 5 mg via ORAL
  Filled 2023-07-26: qty 1

## 2023-07-26 MED ORDER — ONDANSETRON HCL 4 MG/2ML IJ SOLN
4.0000 mg | Freq: Once | INTRAMUSCULAR | Status: AC
  Administered 2023-07-26: 4 mg via INTRAVENOUS
  Filled 2023-07-26: qty 2

## 2023-07-26 MED ORDER — WARFARIN SODIUM 5 MG PO TABS: 10.0000 mg | ORAL_TABLET | ORAL | Status: DC

## 2023-07-26 MED ORDER — CEFAZOLIN IN SODIUM CHLORIDE 3-0.9 GM/100ML-% IV SOLN
3.0000 g | INTRAVENOUS | Status: DC
  Filled 2023-07-26: qty 100

## 2023-07-26 MED ORDER — TRANEXAMIC ACID-NACL 1000-0.7 MG/100ML-% IV SOLN
1000.0000 mg | INTRAVENOUS | Status: AC
  Administered 2023-07-27: 1000 mg via INTRAVENOUS

## 2023-07-26 MED ORDER — WARFARIN SODIUM 5 MG PO TABS: 5.0000 mg | ORAL_TABLET | ORAL | Status: DC

## 2023-07-26 MED ORDER — CHLORHEXIDINE GLUCONATE 4 % EX SOLN
60.0000 mL | Freq: Once | CUTANEOUS | Status: AC
  Administered 2023-07-27: 4 via TOPICAL
  Filled 2023-07-26: qty 60

## 2023-07-26 MED ORDER — LISINOPRIL 20 MG PO TABS
20.0000 mg | ORAL_TABLET | Freq: Every day | ORAL | Status: DC
  Administered 2023-07-27 – 2023-07-31 (×5): 20 mg via ORAL
  Filled 2023-07-26 (×5): qty 1

## 2023-07-26 MED ORDER — TRANEXAMIC ACID 1000 MG/10ML IV SOLN
2000.0000 mg | INTRAVENOUS | Status: DC
  Filled 2023-07-26: qty 20

## 2023-07-26 MED ORDER — METRONIDAZOLE 500 MG/100ML IV SOLN
500.0000 mg | Freq: Two times a day (BID) | INTRAVENOUS | Status: AC
  Administered 2023-07-26 – 2023-07-28 (×6): 500 mg via INTRAVENOUS
  Filled 2023-07-26 (×6): qty 100

## 2023-07-26 MED ORDER — CEFAZOLIN IN SODIUM CHLORIDE 3-0.9 GM/100ML-% IV SOLN
3.0000 g | INTRAVENOUS | Status: AC
  Administered 2023-07-27: 3 g via INTRAVENOUS
  Filled 2023-07-26 (×2): qty 100

## 2023-07-26 NOTE — Inpatient Diabetes Management (Signed)
Inpatient Diabetes Program Recommendations  AACE/ADA: New Consensus Statement on Inpatient Glycemic Control (2015)  Target Ranges:  Prepandial:   less than 140 mg/dL      Peak postprandial:   less than 180 mg/dL (1-2 hours)      Critically ill patients:  140 - 180 mg/dL   Lab Results  Component Value Date   GLUCAP 296 (H) 07/26/2023   HGBA1C 10.2 (H) 07/25/2023    Latest Reference Range & Units 07/25/23 23:13 07/26/23 08:49  Glucose-Capillary 70 - 99 mg/dL 409 (H) 811 (H)  (H): Data is abnormally high  Latest Reference Range & Units 01/24/23 00:47 07/25/23 22:51  Hemoglobin A1C 4.8 - 5.6 % 13.4 (H) 10.2 (H)  (H): Data is abnormally high Review of Glycemic Control  Diabetes history: DM2 Outpatient Diabetes medications: Lantus 30 units daily, Metformin 1 gm bid Current orders for Inpatient glycemic control: Semglee 30 units daily, Novolog 0-15 units tid, 0-5 units hs  Noted A1c decreased from 13.4 when hospitalized 01/25/23 and now 10.2 after starting on insulin.  Will follow while inpatient.  Thank you, Adam Long. Genie Mirabal, RN, MSN, CDCES  Diabetes Coordinator Inpatient Glycemic Control Team Team Pager [phone] (8am-5pm) 07/26/2023 11:08 AM

## 2023-07-26 NOTE — Hospital Course (Signed)
47yo with h/o CAD, HTN, HLD, s/p AVR on Coumadin, morbid obesity, DM, and PAD s/p L BKA with stump dehiscence and MRSA infection who presented on 10/28 with recurrent stump infection.  Started on Vanc/Ceftriaxone/Flagyl.  Orthopedics consulted.

## 2023-07-26 NOTE — Consult Note (Signed)
Reason for Consult:Left BKA stump infection Referring Physician: Jonah Long Time called: 0848 Time at bedside: 0905   Adam Long is an 47 y.o. male.  HPI: Adam Long developed pain and discharge from his BKA stump at the end of last week. He made an appointment to see ortho tomorrow but felt he couldn't wait until then. He presented to the ED and was admitted and orthopedic surgery consulted. He had some mild chills but no other systemic symptoms.  Past Medical History:  Diagnosis Date   Anxiety    Aortic stenosis    Cellulitis and abscess of lower extremity 06/11/2019   Cellulitis of fourth toe of left foot    Cholelithiasis    Coronary artery disease    Nonobstructive CAD (40-50% LAD) 08/2019   Depression    Diabetes mellitus without complication (HCC)    Phreesia 09/27/2020   Elevated troponin level not due myocardial infarction 11/11/2019   Essential hypertension    Gangrene of toe of left foot (HCC) 07/06/2019   Heart murmur    Phreesia 09/27/2020   Hyperlipidemia    Phreesia 09/27/2020   Hypertension    Phreesia 09/27/2020   Mixed hyperlipidemia    Morbid obesity (HCC)    S/P aortic valve replacement with mechanical valve 12/05/2019   25 mm Carbomedics top hat bileaflet mechanical valve via partial upper hemi-sternotomy   Severe aortic stenosis 09/24/2019   Type 2 diabetes mellitus (HCC)     Past Surgical History:  Procedure Laterality Date   ABDOMINAL AORTOGRAM W/LOWER EXTREMITY N/A 07/06/2019   Procedure: ABDOMINAL AORTOGRAM W/LOWER EXTREMITY;  Surgeon: Sherren Kerns, MD;  Location: [facility] IN[facility];  Service: Cardiovascular;  Laterality: N/A;  Bilateral   AMPUTATION Left 07/09/2019   Procedure: LEFT FOURTH and Fifth TOE AMPUTATION.;  Surgeon: Larina Earthly, MD;  Location: [facility] OR;  Service: Vascular;  Laterality: Left;   AMPUTATION Left 03/11/2021   Procedure: LEFT FOOT 5TH  AND 4TH RAY AMPUTATION;  Surgeon: Nadara Mustard, MD;  Location: [facility] OR;  Service:  Orthopedics;  Laterality: Left;   AMPUTATION Left 08/28/2021   Procedure: AMPUTATION BELOW KNEE;  Surgeon: Nadara Mustard, MD;  Location: [facility] OR;  Service: Orthopedics;  Laterality: Left;   AMPUTATION Left 01/29/2023   Procedure: LEFT AMPUTATION BELOW KNEE REVISION AND CLOSURE;  Surgeon: Nadara Mustard, MD;  Location: [facility] OR;  Service: Orthopedics;  Laterality: Left;   AORTIC VALVE REPLACEMENT N/A 12/05/2019   Procedure: PARTIAL STERNOTOMY FOR AORTIC VALVE REPLACEMENT (AVR), USING CARBOMEDICS SUPRA-ANNULAR TOP HAT ;  Surgeon: Purcell Nails, MD;  Location: [facility] OR;  Service: Open Heart Surgery;  Laterality: N/A;  No neck lines on left   APPLICATION OF WOUND VAC Left 10/30/2021   Procedure: APPLICATION OF WOUND VAC;  Surgeon: Nadara Mustard, MD;  Location: [facility] OR;  Service: Orthopedics;  Laterality: Left;   APPLICATION OF WOUND VAC Left 12/25/2021   Procedure: APPLICATION OF WOUND VAC;  Surgeon: Nadara Mustard, MD;  Location: [facility] OR;  Service: Orthopedics;  Laterality: Left;   APPLICATION OF WOUND VAC Left 01/26/2023   Procedure: APPLICATION OF WOUND VAC TO LEFT STUMP;  Surgeon: Tarry Kos, MD;  Location: [facility] OR;  Service: Orthopedics;  Laterality: Left;   CARDIAC VALVE REPLACEMENT N/A    Phreesia 09/27/2020   I & D EXTREMITY Left 01/26/2023   Procedure: IRRIGATION AND DEBRIDEMENT OF LEFT BKA STUMP;  Surgeon: Tarry Kos, MD;  Location: [facility] OR;  Service: Orthopedics;  Laterality: Left;   IR RADIOLOGY PERIPHERAL GUIDED IV START  10/05/2019   IR US GUIDE VASC ACCESS RIGHT  10/05/2019   MULTIPLE EXTRACTIONS WITH ALVEOLOPLASTY N/A 10/26/2019   Procedure: EXTRACTION OF TOOTH #'S 3, 5-11,19-28,  AND 32 WITH ALVEOLOPLASTY;  Surgeon: Charlynne Pander, DDS;  Location: [facility] OR;  Service: Oral Surgery;  Laterality: N/A;   RIGHT HEART CATH AND CORONARY ANGIOGRAPHY N/A 09/24/2019   Procedure: RIGHT HEART CATH AND CORONARY ANGIOGRAPHY;  Surgeon: Tonny Bollman, MD;  Location: [facility] INVASIVE CV LAB;  Service: Cardiovascular;   Laterality: N/A;   STUMP REVISION Left 10/30/2021   Procedure: REVISION LEFT BELOW KNEE AMPUTATION;  Surgeon: Nadara Mustard, MD;  Location: [facility] OR;  Service: Orthopedics;  Laterality: Left;   STUMP REVISION Left 12/25/2021   Procedure: REVISION LEFT BELOW KNEE AMPUTATION;  Surgeon: Nadara Mustard, MD;  Location: [facility] OR;  Service: Orthopedics;  Laterality: Left;   TEE WITHOUT CARDIOVERSION N/A 12/05/2019   Procedure: TRANSESOPHAGEAL ECHOCARDIOGRAM (TEE);  Surgeon: Purcell Nails, MD;  Location: [facility] OR;  Service: Open Heart Surgery;  Laterality: N/A;   TEE WITHOUT CARDIOVERSION N/A 09/01/2021   Procedure: TRANSESOPHAGEAL ECHOCARDIOGRAM (TEE);  Surgeon: Little Ishikawa, MD;  Location: [facility] ENDOSCOPY;  Service: Cardiovascular;  Laterality: N/A;    Family History  Problem Relation Age of Onset   Cancer Mother        Brain tumor   Heart disease Father    Hyperlipidemia Father    Hypertension Father    Stroke Father    Heart murmur Sister    Heart attack Maternal Grandmother    Liver disease Maternal Grandfather    Breast cancer Paternal Grandmother    COPD Paternal Grandfather    Heart attack Maternal Aunt    Colon cancer Maternal Uncle 67   Pancreatic cancer Paternal Aunt    Heart disease Paternal Uncle    Prostate cancer Neg Hx     Social History:  reports that he has never smoked. He quit smokeless tobacco use about 3 years ago.  His smokeless tobacco use included chew. He reports current alcohol use of about 4.0 standard drinks of alcohol per week. He reports that he does not use drugs.  Allergies: No Known Allergies  Medications: I have reviewed the patient's current medications.  Results for orders placed or performed during the hospital encounter of 07/25/23 (from the past 48 hour(s))  Urinalysis, w/ Reflex to Culture (Infection Suspected) -Urine, Clean Catch     Status: Abnormal   Collection Time: 07/25/23  3:33 PM  Result Value Ref Range   Specimen Source URINE,  CATHETERIZED    Color, Urine YELLOW YELLOW   APPearance CLOUDY (A) CLEAR   Specific Gravity, Urine 1.018 1.005 - 1.030   pH 5.0 5.0 - 8.0   Glucose, UA >=500 (A) NEGATIVE mg/dL   Hgb urine dipstick LARGE (A) NEGATIVE   Bilirubin Urine NEGATIVE NEGATIVE   Ketones, ur NEGATIVE NEGATIVE mg/dL   Protein, ur 30 (A) NEGATIVE mg/dL   Nitrite NEGATIVE NEGATIVE   Leukocytes,Ua TRACE (A) NEGATIVE   RBC / HPF >50 0 - 5 RBC/hpf   WBC, UA >50 0 - 5 WBC/hpf    Comment:        Reflex urine culture not performed if WBC <=10, OR if Squamous epithelial cells >5. If Squamous epithelial cells >5 suggest recollection.    Bacteria, UA FEW (A) NONE SEEN   Squamous Epithelial / HPF 0-5 0 - 5 /HPF  Mucus PRESENT    Uric Acid Crys, UA PRESENT     Comment: Performed at [facility] Lab, [address], [place], [place] [address]  Comprehensive metabolic panel     Status: Abnormal   Collection Time: 07/25/23  3:44 PM  Result Value Ref Range   Sodium 128 (L) 135 - 145 mmol/L   Potassium 4.2 3.5 - 5.1 mmol/L   Chloride 98 98 - 111 mmol/L   CO2 22 22 - 32 mmol/L   Glucose, Bld 293 (H) 70 - 99 mg/dL    Comment: Glucose reference range applies only to samples taken after fasting for at least 8 hours.   BUN 14 6 - 20 mg/dL   Creatinine, Ser 4.25 0.61 - 1.24 mg/dL   Calcium 8.2 (L) 8.9 - 10.3 mg/dL   Total Protein 7.3 6.5 - 8.1 g/dL   Albumin 2.6 (L) 3.5 - 5.0 g/dL   AST 24 15 - 41 U/L   ALT 23 0 - 44 U/L   Alkaline Phosphatase 53 38 - 126 U/L   Total Bilirubin 0.6 0.3 - 1.2 mg/dL   GFR, Estimated >95 >63 mL/min    Comment: (NOTE) Calculated using the CKD-EPI Creatinine Equation (2021)    Anion gap 8 5 - 15    Comment: Performed at [facility] Lab, [address] [place], [place] [address]  CBC with Differential     Status: Abnormal   Collection Time: 07/25/23  3:44 PM  Result Value Ref Range   WBC 8.6 4.0 - 10.5 K/uL   RBC 3.85 (L) 4.22 - 5.81 MIL/uL   Hemoglobin 8.1 (L) 13.0 - 17.0 g/dL     Comment: Reticulocyte Hemoglobin testing may be clinically indicated, consider ordering this additional test [id number]    HCT 28.4 (L) 39.0 - 52.0 %   MCV 73.8 (L) 80.0 - 100.0 fL   MCH 21.0 (L) 26.0 - 34.0 pg   MCHC 28.5 (L) 30.0 - 36.0 g/dL   RDW 41.6 (H) 60.6 - 30.1 %   Platelets 390 150 - 400 K/uL   nRBC 0.2 0.0 - 0.2 %   Neutrophils Relative % 72 %   Neutro Abs 6.3 1.7 - 7.7 K/uL   Lymphocytes Relative 15 %   Lymphs Abs 1.3 0.7 - 4.0 K/uL   Monocytes Relative 5 %   Monocytes Absolute 0.5 0.1 - 1.0 K/uL   Eosinophils Relative 1 %   Eosinophils Absolute 0.1 0.0 - 0.5 K/uL   Basophils Relative 1 %   Basophils Absolute 0.0 0.0 - 0.1 K/uL   Immature Granulocytes 6 %   Abs Immature Granulocytes 0.48 (H) 0.00 - 0.07 K/uL    Comment: Performed at [facility] Lab, [address], [place], [place] [address]  I-Stat Lactic Acid, ED     Status: None   Collection Time: 07/25/23  4:03 PM  Result Value Ref Range   Lactic Acid, Venous 1.6 0.5 - 1.9 mmol/L  I-Stat Lactic Acid, ED     Status: None   Collection Time: 07/25/23  9:07 PM  Result Value Ref Range   Lactic Acid, Venous 1.7 0.5 - 1.9 mmol/L  Type and screen [facility]     Status: None   Collection Time: 07/25/23 10:42 PM  Result Value Ref Range   ABO/RH(D) A POS    Antibody Screen NEG    Sample Expiration      07/28/2023,2359 Performed at [facility] Lab, [address] [place], [place] [address]   Hemoglobin A1c     Status: Abnormal   Collection Time: 07/25/23 10:51 PM  Result Value Ref Range   Hgb A1c MFr Bld 10.2 (H) 4.8 - 5.6 %    Comment: (NOTE) Pre diabetes:          5.7%-6.4%  Diabetes:              >6.4%  Glycemic control for   <7.0% adults with diabetes    Mean Plasma Glucose 246.04 mg/dL    Comment: Performed at [facility] Lab, [address] [place], [place] [address]  Protime-INR     Status: Abnormal   Collection Time: 07/25/23 10:51 PM  Result Value Ref Range    Prothrombin Time 27.2 (H) 11.4 - 15.2 seconds   INR 2.5 (H) 0.8 - 1.2    Comment: (NOTE) INR goal varies based on device and disease states. Performed at [facility] Lab, [address], [place], [place] [address]   Osmolality     Status: Abnormal   Collection Time: 07/25/23 10:51 PM  Result Value Ref Range   Osmolality 299 (H) 275 - 295 mOsm/kg    Comment: Performed at [facility] Lab, [address], [place], [place] [address]  Glucose, capillary     Status: Abnormal   Collection Time: 07/25/23 11:13 PM  Result Value Ref Range   Glucose-Capillary 244 (H) 70 - 99 mg/dL    Comment: Glucose reference range applies only to samples taken after fasting for at least 8 hours.  CBC     Status: Abnormal   Collection Time: 07/26/23  6:43 AM  Result Value Ref Range   WBC 8.5 4.0 - 10.5 K/uL   RBC 3.55 (L) 4.22 - 5.81 MIL/uL   Hemoglobin 7.5 (L) 13.0 - 17.0 g/dL    Comment: Reticulocyte Hemoglobin testing may be clinically indicated, consider ordering this additional test [id number]    HCT 26.0 (L) 39.0 - 52.0 %   MCV 73.2 (L) 80.0 - 100.0 fL   MCH 21.1 (L) 26.0 - 34.0 pg   MCHC 28.8 (L) 30.0 - 36.0 g/dL   RDW 95.2 (H) 84.1 - 32.4 %   Platelets 365 150 - 400 K/uL   nRBC 0.4 (H) 0.0 - 0.2 %    Comment: Performed at [facility] Lab, [address], [place], [place] [address]  Basic metabolic panel     Status: Abnormal   Collection Time: 07/26/23  6:43 AM  Result Value Ref Range   Sodium 134 (L) 135 - 145 mmol/L    Comment: ELECTROLYTES REPEATED TO VERIFY   Potassium 4.4 3.5 - 5.1 mmol/L   Chloride 100 98 - 111 mmol/L   CO2 25 22 - 32 mmol/L   Glucose, Bld 274 (H) 70 - 99 mg/dL    Comment: Glucose reference range applies only to samples taken after fasting for at least 8 hours.   BUN 11 6 - 20 mg/dL   Creatinine, Ser 7.25 0.61 - 1.24 mg/dL   Calcium 8.6 (L) 8.9 - 10.3 mg/dL   GFR, Estimated >36 >64 mL/min    Comment: (NOTE) Calculated using the CKD-EPI Creatinine Equation  (2021)    Anion gap 9 5 - 15    Comment: Performed at [facility] Lab, [address], [place], [place] [address]  Protime-INR     Status: Abnormal   Collection Time: 07/26/23  6:43 AM  Result Value Ref Range  Prothrombin Time 28.4 (H) 11.4 - 15.2 seconds   INR 2.6 (H) 0.8 - 1.2    Comment: (NOTE) INR goal varies based on device and disease states. Performed at [facility] Lab, [address] [place], [place] [address]   Glucose, capillary     Status: Abnormal   Collection Time: 07/26/23  8:49 AM  Result Value Ref Range   Glucose-Capillary 296 (H) 70 - 99 mg/dL    Comment: Glucose reference range applies only to samples taken after fasting for at least 8 hours.    DG Knee 2 Views Left  Result Date: 07/25/2023 CLINICAL DATA:  Wound infection, diabetes EXAM: LEFT KNEE - 1-2 VIEW COMPARISON:  01/23/2023 FINDINGS: Frontal and lateral views of the left knee are obtained. Prior below-knee amputation. No acute fracture, subluxation, or dislocation. Joint spaces are well preserved. There is diffuse subcutaneous edema. No evidence of subcutaneous gas or radiopaque foreign body. No evidence of bony erosion or periosteal reaction to suggest acute osteomyelitis. IMPRESSION: 1. Diffuse soft tissue swelling consistent with cellulitis. 2. No evidence of bony destruction or periosteal reaction to suggest acute osteomyelitis. The appearance of the below-knee amputation site is similar to prior CT exam. Electronically Signed   By: Sharlet Salina M.D.   On: 07/25/2023 19:12   DG Chest 2 View  Result Date: 07/25/2023 CLINICAL DATA:  Diabetes, hypertension, wound infection EXAM: CHEST - 2 VIEW COMPARISON:  02/06/2023 FINDINGS: Frontal and lateral views of the chest demonstrate stable enlargement of the cardiac silhouette. No airspace disease, effusion, or pneumothorax. No acute bony abnormalities. IMPRESSION: 1. Stable chest, no acute process. Electronically Signed   By: Sharlet Salina M.D.   On:  07/25/2023 19:10    Review of Systems  Constitutional:  Positive for chills. Negative for diaphoresis and fever.  HENT:  Negative for ear discharge, ear pain, hearing loss and tinnitus.   Eyes:  Negative for photophobia and pain.  Respiratory:  Negative for cough and shortness of breath.   Cardiovascular:  Negative for chest pain.  Gastrointestinal:  Negative for abdominal pain, nausea and vomiting.  Genitourinary:  Negative for dysuria, flank pain, frequency and urgency.  Musculoskeletal:  Positive for arthralgias (Left stump). Negative for back pain, myalgias and neck pain.  Neurological:  Negative for dizziness and headaches.  Hematological:  Does not bruise/bleed easily.  Psychiatric/Behavioral:  The patient is not nervous/anxious.    Blood pressure (!) 142/65, pulse 91, temperature 98.1 F (36.7 C), temperature source Oral, resp. rate 17, height 5\' 10"  (1.778 m), weight (!) 169.5 kg, SpO2 96%. Physical Exam Constitutional:      General: He is not in acute distress.    Appearance: He is well-developed. He is not diaphoretic.  HENT:     Head: Normocephalic and atraumatic.  Eyes:     General: No scleral icterus.       Right eye: No discharge.        Left eye: No discharge.     Conjunctiva/sclera: Conjunctivae normal.  Cardiovascular:     Rate and Rhythm: Normal rate and regular rhythm.  Pulmonary:     Effort: Pulmonary effort is normal. No respiratory distress.  Musculoskeletal:     Cervical back: Normal range of motion.     Comments: LLE No traumatic wounds, ecchymosis, or rash  BKA stump with purulent, malodorous discharge, NT, no fluctuance  No knee effusion  Knee stable to varus/ valgus and anterior/posterior stress  Skin:    General: Skin is warm and  dry.  Neurological:     Mental Status: He is alert.  Psychiatric:        Mood and Affect: Mood normal.        Behavior: Behavior normal.     Assessment/Plan: Left stump infection -- Dr. Lajoyce Corners to evaluate later today  or in AM. Please keep NPO after MN for likely surgery tomorrow.    Freeman Caldron, PA-C Orthopedic Surgery [phone] 07/26/2023, 9:26 AM

## 2023-07-26 NOTE — Plan of Care (Signed)
  Problem: Education: Goal: Knowledge of General Education information will improve Description: Including pain rating scale, medication(s)/side effects and non-pharmacologic comfort measures Outcome: Progressing   Problem: Health Behavior/Discharge Planning: Goal: Ability to manage health-related needs will improve Outcome: Progressing   Problem: Clinical Measurements: Goal: Ability to maintain clinical measurements within normal limits will improve Outcome: Progressing Goal: Will remain free from infection Outcome: Progressing Goal: Diagnostic test results will improve Outcome: Progressing Goal: Respiratory complications will improve Outcome: Progressing Goal: Cardiovascular complication will be avoided Outcome: Progressing   Problem: Activity: Goal: Risk for activity intolerance will decrease Outcome: Progressing   Problem: Nutrition: Goal: Adequate nutrition will be maintained Outcome: Progressing   Problem: Coping: Goal: Level of anxiety will decrease Outcome: Progressing   Problem: Elimination: Goal: Will not experience complications related to bowel motility Outcome: Progressing Goal: Will not experience complications related to urinary retention Outcome: Progressing   Problem: Pain Management: Goal: General experience of comfort will improve Outcome: Progressing   Problem: Safety: Goal: Ability to remain free from injury will improve Outcome: Progressing   Problem: Skin Integrity: Goal: Risk for impaired skin integrity will decrease Outcome: Progressing   Problem: Clinical Measurements: Goal: Ability to avoid or minimize complications of infection will improve Outcome: Progressing   Problem: Skin Integrity: Goal: Skin integrity will improve Outcome: Progressing   Problem: Education: Goal: Ability to describe self-care measures that may prevent or decrease complications (Diabetes Survival Skills Education) will improve Outcome: Progressing Goal:  Individualized Educational Video(s) Outcome: Progressing   Problem: Coping: Goal: Ability to adjust to condition or change in health will improve Outcome: Progressing   Problem: Fluid Volume: Goal: Ability to maintain a balanced intake and output will improve Outcome: Progressing   Problem: Health Behavior/Discharge Planning: Goal: Ability to identify and utilize available resources and services will improve Outcome: Progressing Goal: Ability to manage health-related needs will improve Outcome: Progressing   Problem: Metabolic: Goal: Ability to maintain appropriate glucose levels will improve Outcome: Progressing   Problem: Nutritional: Goal: Maintenance of adequate nutrition will improve Outcome: Progressing Goal: Progress toward achieving an optimal weight will improve Outcome: Progressing   Problem: Skin Integrity: Goal: Risk for impaired skin integrity will decrease Outcome: Progressing   Problem: Tissue Perfusion: Goal: Adequacy of tissue perfusion will improve Outcome: Progressing

## 2023-07-26 NOTE — Progress Notes (Signed)
Progress Note   Patient: Adam Long EXB:[id number] DOB: 03-05-1976 DOA: 07/25/2023     1 DOS: the patient was seen and examined on 07/26/2023   Brief hospital course: 47yo with h/o CAD, HTN, HLD, s/p AVR on Coumadin, morbid obesity, DM, and PAD s/p L BKA with stump dehiscence and MRSA infection who presented on 10/28 with recurrent stump infection.  Started on Vanc/Ceftriaxone/Flagyl.  Orthopedics consulted.  Assessment and Plan:  BKA stump infection Underwent BKA in 08/2021 and revisions in 10/2021, 11/2021, 01/2023 Now with signs of stump infection including erythema and purulent drainage X-ray showing diffuse soft tissue swelling consistent with cellulitis but no evidence of acute osteomyelitis. Negative lactate, no current concerns for sepsis Will treat with IV antibiotics (Rocephin/Flagyl/Vanc as per the lower extremity wound algorithm) Orthopedics will consult, Dr. Lajoyce Corners Patient is NPO after midnight for stump revision tomorrow   Chronic anemia Hemoglobin 8.1, close to baseline on presentation Hgb down to 7.5 today Will continue to follow, transfuse for Hgb <7   Acute on chronic hyponatremia Sodium 128 on presentation Back to 134 today, corrects for glucose Minimally elevated Osm on admission, likely mild dehydrated but now resolved   Poorly controlled insulin-dependent type 2 diabetes Last A1c 13.4 in April 2024, improved to 10.2 but still very poor control Continue home basal insulin Hold metformin Moderate sliding scale insulin ACHS ordered.   ?UTI UA with negative nitrite, trace leukocytes, and microscopy showing >50 RBCs, >50 WBCs, and few bacteria Patient is not endorsing any urinary symptoms, vomiting, or abdominal/flank pain at present Already on antibiotics for stump site infection/cellulitis Urine culture pending, likely asymptomatic bacteriuria He had Klebsiella on prior culture on 10/1, would be appropriately treated with current abx assuming similar  sensitivities if UTI is present   Microscopic hematuria CT done 06/28/2023 showing nonobstructing left renal stones Patient denies any abdominal or flank pain at present   CAD Not endorsing any anginal symptoms   Hypertension Continue metoprolol Resume lisinopril (not clear why this was discontinued after admission)   Hyperlipidemia Continue Zetia   Mechanical aortic valve Coumadin dosing per pharmacy ordered on admission - changed to Heparin in case of need for surgical intervention   Anxiety and depression Continue Zoloft   Chronic pain/neuropathy Continue gabapentin   GERD Continue Protonix  Morbid obesity -Body mass index is 53.62 kg/m..  -Weight loss should be encouraged -Outpatient PCP/bariatric medicine/bariatric surgery f/u encouraged     Consultants: Orthopedics  Procedures: L AKA scheduled for 10/30  Antibiotics: Vancomycin 10/28- Ceftriaxone 10/28- Metronidazole 10/29-   30 Day Unplanned Readmission Risk Score    Flowsheet Row ED to Hosp-Admission (Current) from 07/25/2023 in [facility]  30 Day Unplanned Readmission Risk Score (%) 28.12 Filed at 07/26/2023 0801       This score is the patient's risk of an unplanned readmission within 30 days of being discharged (0 -100%). The score is based on dignosis, age, lab data, medications, orders, and past utilization.   Low:  0-14.9   Medium: 15-21.9   High: 22-29.9   Extreme: 30 and above           Subjective: Feeling ok, no specific complaints other than that his wound is reinfected and draining again.   Objective: Vitals:   07/26/23 0517 07/26/23 0759  BP: 123/67 (!) 142/65  Pulse: 90 91  Resp: 18 17  Temp: 98.1 F (36.7 C) 98.1 F (36.7 C)  SpO2: 98% 96%    Intake/Output Summary (  Last 24 hours) at 07/26/2023 1442 Last data filed at 07/25/2023 2146 Gross per 24 hour  Intake 100 ml  Output --  Net 100 ml   Filed Weights   07/25/23 2109  07/25/23 2215  Weight: (!) 165.6 kg (!) 169.5 kg    Exam:  General:  Appears calm and comfortable and is in NAD, sedentary Eyes:   EOMI, normal lids, iris ENT:  grossly normal hearing, lips & tongue, mmm Neck:  no LAD, masses or thyromegaly Cardiovascular:  RRR, no m/r/g. No LE edema.  Respiratory:   CTA bilaterally with no wheezes/rales/rhonchi.  Normal respiratory effort. Abdomen:  soft, NT, ND Skin:  L BKA stump with purulent drainage, erythema   Musculoskeletal:  s/p L BKA Lower extremity:  No LE edema.  Limited right foot exam with no ulcerations.  2+ distal pulses. Psychiatric:  grossly normal mood and affect, speech fluent and appropriate, AOx3 Neurologic:  CN 2-12 grossly intact, moves all extremities in coordinated fashion, sensation intact  Data Reviewed: I have reviewed the patient's lab results since admission.  Pertinent labs for today include:  Glucose 274 WBC 8.5 Hgb 7.5; 8.1 on 10/28 INR 2.6 A1c 10.2    Family Communication: None present - lives with his uncle  Disposition: Status is: Inpatient Remains inpatient appropriate because: needs AKA     Time spent: 50 minutes  Unresulted Labs (From admission, onward)     Start     Ordered   07/27/23 0500  CBC with Differential/Platelet  Tomorrow morning,   R       Question:  Specimen collection method  Answer:  Lab=Lab collect   07/26/23 1442   07/27/23 0500  Basic metabolic panel  Tomorrow morning,   R       Question:  Specimen collection method  Answer:  Lab=Lab collect   07/26/23 1442   07/26/23 0500  Protime-INR  Daily,   R      07/26/23 0439   07/25/23 1533  Culture, blood (Routine x 2)  BLOOD CULTURE X 2,   R (with STAT occurrences)      07/25/23 1533             Author: Jonah Blue, MD 07/26/2023 2:42 PM  For on call review [email or web address].

## 2023-07-26 NOTE — Progress Notes (Signed)
PHARMACY - ANTICOAGULATION CONSULT NOTE  Pharmacy Consult for PTA warfarin >> IV heparin Indication: AVR replacement  No Known Allergies  Patient Measurements: Height: 5\' 10"  (177.8 cm) Weight: (!) 169.5 kg (373 lb 10.9 oz) IBW/kg (Calculated) : 73 Heparin Dosing Weight: 113.5  Vital Signs: Temp: 98.1 F (36.7 C) (10/29 0759) Temp Source: Oral (10/29 0759) BP: 142/65 (10/29 0759) Pulse Rate: 91 (10/29 0759)  Labs: Recent Labs    07/25/23 1544 07/25/23 2251 07/26/23 0643  HGB 8.1*  --  7.5*  HCT 28.4*  --  26.0*  PLT 390  --  365  LABPROT  --  27.2* 28.4*  INR  --  2.5* 2.6*  CREATININE 0.85  --  0.88    Estimated Creatinine Clearance: 163.8 mL/min (by C-G formula based on SCr of 0.88 mg/dL).   Medical History: Past Medical History:  Diagnosis Date   Anxiety    Aortic stenosis    Cellulitis and abscess of lower extremity 06/11/2019   Cellulitis of fourth toe of left foot    Cholelithiasis    Coronary artery disease    Nonobstructive CAD (40-50% LAD) 08/2019   Depression    Diabetes mellitus without complication (HCC)    Phreesia 09/27/2020   Elevated troponin level not due myocardial infarction 11/11/2019   Essential hypertension    Gangrene of toe of left foot (HCC) 07/06/2019   Heart murmur    Phreesia 09/27/2020   Hyperlipidemia    Phreesia 09/27/2020   Hypertension    Phreesia 09/27/2020   Mixed hyperlipidemia    Morbid obesity (HCC)    S/P aortic valve replacement with mechanical valve 12/05/2019   25 mm Carbomedics top hat bileaflet mechanical valve via partial upper hemi-sternotomy   Severe aortic stenosis 09/24/2019   Type 2 diabetes mellitus (HCC)     Medications:  Scheduled:   ezetimibe  10 mg Oral Daily   gabapentin  600 mg Oral BID   insulin aspart  0-15 Units Subcutaneous TID WC   insulin aspart  0-5 Units Subcutaneous QHS   insulin glargine-yfgn  30 Units Subcutaneous Daily   metoprolol tartrate  25 mg Oral BID   pantoprazole  40  mg Oral Daily   sertraline  50 mg Oral Daily    Assessment: 47 yo male admitted for L BKA stump infection. Patient is on warfarin PTA for history of mechanical AVR. Warfarin initially resumed on admission, pharmacy consulted to transition warfarin to IV heparin pending orthopedic w/u and possibility for amputation. PTA warfarin regimen: 5mg  MWF, 10mg  all other days. Last dose of warfarin was 10/28.   INR this AM therapeutic at 2.6. Hgb low stable. Plts wnl.   Goal of Therapy:  INR 2-3 Heparin level 0.3-0.7 units/ml Monitor platelets by anticoagulation protocol: Yes   Plan:  Begin IV heparin once INR < 2 F/u surgical plans and ability to resume PTA warfarin  Rexford Maus, PharmD, BCPS 07/26/2023 9:08 AM

## 2023-07-26 NOTE — Progress Notes (Addendum)
PHARMACY ANTIBIOTIC CONSULT NOTE   Adam Long a 47 y.o. male with history of chronic wound issues s/p L BKA with multiple revisions presenting with L BKA stump infection, Xray revealing soft tissue swelling c/w cellulitis, no evidence of osteomyelitis.  He is morbidly obese (BMI 53).  Pharmacy has been consulted for Vancomycin dosing.  Wound cultures in 2023 showed ESBL klebsiella, enterococcus faecalis, clostridium perfringens, e.coli, proteus. He also had MSSA and group B strep bacteremia.  His wound cultures in April/May of 2024 grew MRSA. Given patient not septic at this time will not escalate ceftriaxone per MD  07/25/23: Scr 0.85, WBC 8.6  Afebrile  Estimated Creatinine Clearance: 169.6 mL/min (by C-G formula based on SCr of 0.85 mg/dL).  Plan: Rocephin 2gm IV Q24h per MD START vancomycin 2000 mg LD x1, then 1750 mg IV Q12h (eAUC 428, Vd 0.5, IBW) Monitor renal function, clinical status, de-escalation, C/S, levels as indicated   Allergies:  No Known Allergies  Filed Weights   07/25/23 2109 07/25/23 2215  Weight: (!) 165.6 kg (365 lb) (!) 169.5 kg (373 lb 10.9 oz)       Latest Ref Rng & Units 07/25/2023    3:44 PM 06/27/2023    9:00 PM 02/17/2023    2:49 AM  CBC  WBC 4.0 - 10.5 K/uL 8.6  11.9  8.3   Hemoglobin 13.0 - 17.0 g/dL 8.1  8.8  9.3   Hematocrit 39.0 - 52.0 % 28.4  28.8  29.1   Platelets 150 - 400 K/uL 390  239  236     Antibiotics Given (last 72 hours)     Date/Time Action Medication Dose Rate   07/25/23 2116 New Bag/Given   cefTRIAXone (ROCEPHIN) 2 g in sodium chloride 0.9 % 100 mL IVPB 2 g 200 mL/hr   07/25/23 2320 New Bag/Given   vancomycin (VANCOREADY) IVPB 2000 mg/400 mL 2,000 mg 200 mL/hr       Antimicrobials this admission: Vancomycin 10/28 >> c Rocephin 10/28 >> c  Microbiology results: 10/28 Bcx: sent 10/28 Ucx: sent   Thank you for allowing pharmacy to be a part of this patient's care.  Arabella Merles, PharmD. Clinical  Pharmacist 07/26/2023 1:00 AM

## 2023-07-27 ENCOUNTER — Encounter (HOSPITAL_COMMUNITY): Payer: Self-pay | Admitting: Internal Medicine

## 2023-07-27 ENCOUNTER — Other Ambulatory Visit: Payer: Self-pay

## 2023-07-27 ENCOUNTER — Inpatient Hospital Stay (HOSPITAL_COMMUNITY): Payer: Medicaid Other | Admitting: Anesthesiology

## 2023-07-27 ENCOUNTER — Other Ambulatory Visit: Payer: Medicaid Other | Admitting: Pharmacist

## 2023-07-27 ENCOUNTER — Encounter (HOSPITAL_COMMUNITY)
Admission: EM | Disposition: A | Discharge status: Rehabilitation Facility | Payer: Self-pay | Source: Home / Self Care | Attending: Internal Medicine

## 2023-07-27 ENCOUNTER — Ambulatory Visit: Payer: Medicaid Other | Admitting: Family

## 2023-07-27 DIAGNOSIS — T8742 Infection of amputation stump, left upper extremity: Secondary | ICD-10-CM

## 2023-07-27 DIAGNOSIS — E1165 Type 2 diabetes mellitus with hyperglycemia: Secondary | ICD-10-CM | POA: Diagnosis not present

## 2023-07-27 DIAGNOSIS — T8781 Dehiscence of amputation stump: Secondary | ICD-10-CM

## 2023-07-27 DIAGNOSIS — T8740 Infection of amputation stump, unspecified extremity: Secondary | ICD-10-CM | POA: Diagnosis not present

## 2023-07-27 HISTORY — PX: AMPUTATION: SHX166

## 2023-07-27 LAB — BASIC METABOLIC PANEL
Anion gap: 7 (ref 5–15)
BUN: 12 mg/dL (ref 6–20)
CO2: 26 mmol/L (ref 22–32)
Calcium: 8.8 mg/dL — ABNORMAL LOW (ref 8.9–10.3)
Chloride: 101 mmol/L (ref 98–111)
Creatinine, Ser: 1.01 mg/dL (ref 0.61–1.24)
GFR, Estimated: >60 mL/min (ref >60)
Glucose, Bld: 285 mg/dL — ABNORMAL HIGH (ref 70–99)
Potassium: 4.2 mmol/L (ref 3.5–5.1)
Sodium: 134 mmol/L — ABNORMAL LOW (ref 135–145)

## 2023-07-27 LAB — CBC WITH DIFFERENTIAL/PLATELET
Abs Immature Granulocytes: 0.46 10*3/uL — ABNORMAL HIGH (ref 0.00–0.07)
Basophils Absolute: 0.1 10*3/uL (ref 0.0–0.1)
Basophils Relative: 1 %
Eosinophils Absolute: 0.1 10*3/uL (ref 0.0–0.5)
Eosinophils Relative: 1 %
HCT: 29.3 % — ABNORMAL LOW (ref 39.0–52.0)
Hemoglobin: 8.3 g/dL — ABNORMAL LOW (ref 13.0–17.0)
Immature Granulocytes: 5 %
Lymphocytes Relative: 15 %
Lymphs Abs: 1.3 10*3/uL (ref 0.7–4.0)
MCH: 20.8 pg — ABNORMAL LOW (ref 26.0–34.0)
MCHC: 28.3 g/dL — ABNORMAL LOW (ref 30.0–36.0)
MCV: 73.4 fL — ABNORMAL LOW (ref 80.0–100.0)
Monocytes Absolute: 0.5 10*3/uL (ref 0.1–1.0)
Monocytes Relative: 6 %
Neutro Abs: 6.1 10*3/uL (ref 1.7–7.7)
Neutrophils Relative %: 72 %
Platelets: 427 10*3/uL — ABNORMAL HIGH (ref 150–400)
RBC: 3.99 MIL/uL — ABNORMAL LOW (ref 4.22–5.81)
RDW: 18.6 % — ABNORMAL HIGH (ref 11.5–15.5)
WBC: 8.5 10*3/uL (ref 4.0–10.5)
nRBC: 0.6 % — ABNORMAL HIGH (ref 0.0–0.2)

## 2023-07-27 LAB — PROTIME-INR
INR: 2.4 — ABNORMAL HIGH (ref 0.8–1.2)
Prothrombin Time: 26.2 s — ABNORMAL HIGH (ref 11.4–15.2)

## 2023-07-27 LAB — URINE CULTURE: Culture: >=100000 — AB

## 2023-07-27 LAB — GLUCOSE, CAPILLARY
Glucose-Capillary: 281 mg/dL — ABNORMAL HIGH (ref 70–99)
Glucose-Capillary: 251 mg/dL — ABNORMAL HIGH (ref 70–99)
Glucose-Capillary: 170 mg/dL — ABNORMAL HIGH (ref 70–99)
Glucose-Capillary: 182 mg/dL — ABNORMAL HIGH (ref 70–99)
Glucose-Capillary: 363 mg/dL — ABNORMAL HIGH (ref 70–99)

## 2023-07-27 LAB — SURGICAL PCR SCREEN
MRSA, PCR: POSITIVE — AB
Staphylococcus aureus: POSITIVE — AB

## 2023-07-27 SURGERY — AMPUTATION, ABOVE KNEE
Anesthesia: General | Site: Knee | Laterality: Left

## 2023-07-27 MED ORDER — VANCOMYCIN HCL 1000 MG IV SOLR
INTRAVENOUS | Status: AC
  Filled 2023-07-27: qty 20

## 2023-07-27 MED ORDER — HYDROMORPHONE HCL 1 MG/ML IJ SOLN
INTRAMUSCULAR | Status: AC
  Filled 2023-07-27: qty 1

## 2023-07-27 MED ORDER — PROPOFOL 10 MG/ML IV BOLUS
INTRAVENOUS | Status: DC | PRN
  Administered 2023-07-27: 180 mg via INTRAVENOUS

## 2023-07-27 MED ORDER — VANCOMYCIN HCL 1000 MG IV SOLR
INTRAVENOUS | Status: DC | PRN
  Administered 2023-07-27: 1000 mg

## 2023-07-27 MED ORDER — DEXAMETHASONE SODIUM PHOSPHATE 10 MG/ML IJ SOLN
INTRAMUSCULAR | Status: DC | PRN
  Administered 2023-07-27: 10 mg via INTRAVENOUS

## 2023-07-27 MED ORDER — DEXMEDETOMIDINE HCL IN NACL 200 MCG/50ML IV SOLN
INTRAVENOUS | Status: DC | PRN
  Administered 2023-07-27: 10 ug via INTRAVENOUS
  Administered 2023-07-27: 16 ug via INTRAVENOUS

## 2023-07-27 MED ORDER — HYDRALAZINE HCL 20 MG/ML IJ SOLN: 5.0000 mg | INTRAMUSCULAR | Status: DC | PRN

## 2023-07-27 MED ORDER — ONDANSETRON HCL 4 MG/2ML IJ SOLN: 4.0000 mg | Freq: Four times a day (QID) | INTRAMUSCULAR | Status: DC | PRN

## 2023-07-27 MED ORDER — ACETAMINOPHEN 10 MG/ML IV SOLN
INTRAVENOUS | Status: AC
  Filled 2023-07-27: qty 100

## 2023-07-27 MED ORDER — ZINC SULFATE 220 (50 ZN) MG PO CAPS
220.0000 mg | ORAL_CAPSULE | Freq: Every day | ORAL | Status: DC
  Administered 2023-07-27 – 2023-07-31 (×5): 220 mg via ORAL
  Filled 2023-07-27 (×5): qty 1

## 2023-07-27 MED ORDER — MUPIROCIN CALCIUM 2 % EX CREA
TOPICAL_CREAM | Freq: Two times a day (BID) | CUTANEOUS | Status: DC
  Filled 2023-07-27: qty 15

## 2023-07-27 MED ORDER — OXYCODONE HCL 5 MG PO TABS
5.0000 mg | ORAL_TABLET | ORAL | Status: DC | PRN
  Administered 2023-07-28 – 2023-07-31 (×3): 10 mg via ORAL
  Filled 2023-07-27: qty 2
  Filled 2023-07-27: qty 1
  Filled 2023-07-27: qty 2
  Filled 2023-07-27: qty 1

## 2023-07-27 MED ORDER — KETOROLAC TROMETHAMINE 30 MG/ML IJ SOLN
INTRAMUSCULAR | Status: AC
  Filled 2023-07-27: qty 1

## 2023-07-27 MED ORDER — OXYCODONE HCL 5 MG PO TABS
10.0000 mg | ORAL_TABLET | ORAL | Status: DC | PRN
  Administered 2023-07-28 – 2023-07-31 (×7): 15 mg via ORAL
  Filled 2023-07-27 (×7): qty 3

## 2023-07-27 MED ORDER — HYDROMORPHONE HCL 1 MG/ML IJ SOLN
INTRAMUSCULAR | Status: AC
  Filled 2023-07-27: qty 0.5

## 2023-07-27 MED ORDER — HYDROMORPHONE HCL 1 MG/ML IJ SOLN
0.2500 mg | INTRAMUSCULAR | Status: DC | PRN
  Administered 2023-07-27 (×3): 0.5 mg via INTRAVENOUS

## 2023-07-27 MED ORDER — SODIUM CHLORIDE 0.9 % IV SOLN: INTRAVENOUS | Status: DC | PRN

## 2023-07-27 MED ORDER — ONDANSETRON HCL 4 MG/2ML IJ SOLN
INTRAMUSCULAR | Status: AC
  Filled 2023-07-27: qty 2

## 2023-07-27 MED ORDER — PHENOL 1.4 % MT LIQD: 1.0000 | OROMUCOSAL | Status: DC | PRN

## 2023-07-27 MED ORDER — ACETAMINOPHEN 325 MG PO TABS
325.0000 mg | ORAL_TABLET | Freq: Four times a day (QID) | ORAL | Status: DC | PRN
  Administered 2023-07-28: 650 mg via ORAL
  Filled 2023-07-27: qty 2

## 2023-07-27 MED ORDER — MAGNESIUM SULFATE 2 GM/50ML IV SOLN: 2.0000 g | Freq: Every day | INTRAVENOUS | Status: DC | PRN

## 2023-07-27 MED ORDER — ACETAMINOPHEN 10 MG/ML IV SOLN
INTRAVENOUS | Status: DC | PRN
  Administered 2023-07-27: 1000 mg via INTRAVENOUS

## 2023-07-27 MED ORDER — LIDOCAINE 2% (20 MG/ML) 5 ML SYRINGE
INTRAMUSCULAR | Status: AC
  Filled 2023-07-27: qty 5

## 2023-07-27 MED ORDER — ONDANSETRON HCL 4 MG/2ML IJ SOLN: 4.0000 mg | Freq: Once | INTRAMUSCULAR | Status: DC | PRN

## 2023-07-27 MED ORDER — GUAIFENESIN-DM 100-10 MG/5ML PO SYRP: 15.0000 mL | ORAL_SOLUTION | ORAL | Status: DC | PRN

## 2023-07-27 MED ORDER — FENTANYL CITRATE (PF) 250 MCG/5ML IJ SOLN
INTRAMUSCULAR | Status: DC | PRN
  Administered 2023-07-27 (×3): 50 ug via INTRAVENOUS

## 2023-07-27 MED ORDER — SODIUM CHLORIDE 0.9 % IV SOLN: INTRAVENOUS | Status: DC

## 2023-07-27 MED ORDER — JUVEN PO PACK
1.0000 | PACK | Freq: Two times a day (BID) | ORAL | Status: DC
  Administered 2023-07-28 – 2023-07-31 (×8): 1 via ORAL
  Filled 2023-07-27 (×6): qty 1

## 2023-07-27 MED ORDER — VITAMIN C 500 MG PO TABS
1000.0000 mg | ORAL_TABLET | Freq: Every day | ORAL | Status: DC
  Administered 2023-07-27 – 2023-07-31 (×5): 1000 mg via ORAL
  Filled 2023-07-27 (×5): qty 2

## 2023-07-27 MED ORDER — LACTATED RINGERS IV SOLN: INTRAVENOUS | Status: DC

## 2023-07-27 MED ORDER — LABETALOL HCL 5 MG/ML IV SOLN: 10.0000 mg | INTRAVENOUS | Status: DC | PRN

## 2023-07-27 MED ORDER — DEXMEDETOMIDINE HCL IN NACL 80 MCG/20ML IV SOLN
INTRAVENOUS | Status: AC
  Filled 2023-07-27: qty 20

## 2023-07-27 MED ORDER — FENTANYL CITRATE (PF) 250 MCG/5ML IJ SOLN
INTRAMUSCULAR | Status: AC
  Filled 2023-07-27: qty 5

## 2023-07-27 MED ORDER — POTASSIUM CHLORIDE CRYS ER 20 MEQ PO TBCR: 20.0000 meq | EXTENDED_RELEASE_TABLET | Freq: Every day | ORAL | Status: DC | PRN

## 2023-07-27 MED ORDER — HYDROMORPHONE HCL 1 MG/ML IJ SOLN: 0.5000 mg | INTRAMUSCULAR | Status: DC | PRN

## 2023-07-27 MED ORDER — ONDANSETRON HCL 4 MG/2ML IJ SOLN
INTRAMUSCULAR | Status: DC | PRN
  Administered 2023-07-27: 4 mg via INTRAVENOUS

## 2023-07-27 MED ORDER — EPHEDRINE 5 MG/ML INJ
INTRAVENOUS | Status: AC
  Filled 2023-07-27: qty 5

## 2023-07-27 MED ORDER — CHLORHEXIDINE GLUCONATE 0.12 % MT SOLN
OROMUCOSAL | Status: AC
  Administered 2023-07-27: 15 mL via OROMUCOSAL
  Filled 2023-07-27: qty 15

## 2023-07-27 MED ORDER — OXYCODONE HCL 5 MG PO TABS
5.0000 mg | ORAL_TABLET | Freq: Once | ORAL | Status: AC | PRN
Start: 2023-07-27 — End: 2023-07-27

## 2023-07-27 MED ORDER — OXYCODONE HCL 5 MG/5ML PO SOLN
ORAL | Status: AC
  Filled 2023-07-27: qty 5

## 2023-07-27 MED ORDER — 0.9 % SODIUM CHLORIDE (POUR BTL) OPTIME
TOPICAL | Status: DC | PRN
Start: 2023-07-27 — End: 2023-07-27
  Administered 2023-07-27: 1000 mL

## 2023-07-27 MED ORDER — CEFAZOLIN SODIUM-DEXTROSE 2-4 GM/100ML-% IV SOLN: 2.0000 g | Freq: Three times a day (TID) | INTRAVENOUS | Status: DC

## 2023-07-27 MED ORDER — CHLORHEXIDINE GLUCONATE 0.12 % MT SOLN: 15.0000 mL | Freq: Once | OROMUCOSAL | Status: AC

## 2023-07-27 MED ORDER — HYDROMORPHONE HCL 1 MG/ML IJ SOLN
INTRAMUSCULAR | Status: DC | PRN
  Administered 2023-07-27: .5 mg via INTRAVENOUS

## 2023-07-27 MED ORDER — OXYCODONE HCL 5 MG/5ML PO SOLN
5.0000 mg | Freq: Once | ORAL | Status: AC | PRN
  Administered 2023-07-27: 5 mg via ORAL

## 2023-07-27 MED ORDER — VASHE WOUND IRRIGATION OPTIME
TOPICAL | Status: DC | PRN
Start: 2023-07-27 — End: 2023-07-27
  Administered 2023-07-27: 34 [oz_av]

## 2023-07-27 MED ORDER — ORAL CARE MOUTH RINSE: 15.0000 mL | Freq: Once | OROMUCOSAL | Status: AC

## 2023-07-27 MED ORDER — EPHEDRINE SULFATE-NACL 50-0.9 MG/10ML-% IV SOSY
PREFILLED_SYRINGE | INTRAVENOUS | Status: DC | PRN
  Administered 2023-07-27 (×2): 10 mg via INTRAVENOUS

## 2023-07-27 MED ORDER — DEXAMETHASONE SODIUM PHOSPHATE 10 MG/ML IJ SOLN
INTRAMUSCULAR | Status: AC
  Filled 2023-07-27: qty 1

## 2023-07-27 MED ORDER — LIDOCAINE 2% (20 MG/ML) 5 ML SYRINGE
INTRAMUSCULAR | Status: DC | PRN
  Administered 2023-07-27: 100 mg via INTRAVENOUS

## 2023-07-27 MED ORDER — SIMETHICONE 80 MG PO CHEW
80.0000 mg | CHEWABLE_TABLET | Freq: Four times a day (QID) | ORAL | Status: DC | PRN
  Administered 2023-07-27: 80 mg via ORAL
  Filled 2023-07-27: qty 1

## 2023-07-27 MED ORDER — METOPROLOL TARTRATE 5 MG/5ML IV SOLN: 2.0000 mg | INTRAVENOUS | Status: DC | PRN

## 2023-07-27 MED ORDER — KETOROLAC TROMETHAMINE 30 MG/ML IJ SOLN
30.0000 mg | Freq: Once | INTRAMUSCULAR | Status: AC | PRN
  Administered 2023-07-27: 30 mg via INTRAVENOUS

## 2023-07-27 SURGICAL SUPPLY — 46 items
BAG COUNTER SPONGE SURGICOUNT (BAG) IMPLANT
BAG SPNG CNTER NS LX DISP (BAG)
BLADE SAW RECIP 87.9 MT (BLADE) ×1 IMPLANT
BLADE SURG 21 STRL SS (BLADE) ×1 IMPLANT
BNDG CMPR 5X6 CHSV STRCH STRL (GAUZE/BANDAGES/DRESSINGS) ×2
BNDG COHESIVE 6X5 TAN ST LF (GAUZE/BANDAGES/DRESSINGS) ×1 IMPLANT
CANISTER WOUND CARE 500ML ATS (WOUND CARE) IMPLANT
COVER SURGICAL LIGHT HANDLE (MISCELLANEOUS) ×1 IMPLANT
CUFF TOURN SGL QUICK 34 (TOURNIQUET CUFF)
CUFF TRNQT CYL 34X4.125X (TOURNIQUET CUFF) IMPLANT
DRAPE DERMATAC (DRAPES) IMPLANT
DRAPE INCISE IOBAN 66X45 STRL (DRAPES) ×2 IMPLANT
DRAPE U-SHAPE 47X51 STRL (DRAPES) ×1 IMPLANT
DRESSING PREVENA PLUS CUSTOM (GAUZE/BANDAGES/DRESSINGS) ×1 IMPLANT
DRSG PREVENA PLUS CUSTOM (GAUZE/BANDAGES/DRESSINGS) ×1
DURAPREP 26ML APPLICATOR (WOUND CARE) ×1 IMPLANT
ELECT REM PT RETURN 9FT ADLT (ELECTROSURGICAL) ×1
ELECTRODE REM PT RTRN 9FT ADLT (ELECTROSURGICAL) ×1 IMPLANT
GLOVE BIOGEL PI IND STRL 7.5 (GLOVE) ×1 IMPLANT
GLOVE BIOGEL PI IND STRL 9 (GLOVE) ×1 IMPLANT
GLOVE SURG ORTHO 9.0 STRL STRW (GLOVE) ×1 IMPLANT
GLOVE SURG SS PI 6.5 STRL IVOR (GLOVE) ×1 IMPLANT
GOWN STRL REUS W/ TWL LRG LVL3 (GOWN DISPOSABLE) ×1 IMPLANT
GOWN STRL REUS W/ TWL XL LVL3 (GOWN DISPOSABLE) ×2 IMPLANT
GOWN STRL REUS W/TWL LRG LVL3 (GOWN DISPOSABLE) ×1
GOWN STRL REUS W/TWL XL LVL3 (GOWN DISPOSABLE) ×2
GRAFT SKIN WND MICRO 38 (Tissue) IMPLANT
KIT BASIN OR (CUSTOM PROCEDURE TRAY) ×1 IMPLANT
KIT TURNOVER KIT B (KITS) ×1 IMPLANT
MANIFOLD NEPTUNE II (INSTRUMENTS) ×1 IMPLANT
NS IRRIG 1000ML POUR BTL (IV SOLUTION) ×1 IMPLANT
PACK ORTHO EXTREMITY (CUSTOM PROCEDURE TRAY) ×1 IMPLANT
PAD ARMBOARD 7.5X6 YLW CONV (MISCELLANEOUS) ×1 IMPLANT
PREVENA RESTOR ARTHOFORM 46X30 (CANNISTER) ×1 IMPLANT
STAPLER VISISTAT 35W (STAPLE) IMPLANT
STOCKINETTE IMPERVIOUS 9X36 MD (GAUZE/BANDAGES/DRESSINGS) IMPLANT
STOCKINETTE IMPERVIOUS LG (DRAPES) IMPLANT
SUT ETHILON 2 0 PSLX (SUTURE) ×2 IMPLANT
SUT SILK 2 0 (SUTURE) ×1
SUT SILK 2-0 18XBRD TIE 12 (SUTURE) ×1 IMPLANT
SUT VIC AB 0 CT1 27 (SUTURE) ×2
SUT VIC AB 0 CT1 27XBRD ANBCTR (SUTURE) IMPLANT
SUT VIC AB CT1 27XBRD ANBCTRL (SUTURE) ×2
TOWEL GREEN STERILE FF (TOWEL DISPOSABLE) ×1 IMPLANT
TUBE CONNECTING 20X1/4 (TUBING) ×1 IMPLANT
YANKAUER SUCT BULB TIP NO VENT (SUCTIONS) ×1 IMPLANT

## 2023-07-27 NOTE — Anesthesia Postprocedure Evaluation (Signed)
Anesthesia Post Note  Patient: Adam Long  Procedure(s) Performed: LEFT ABOVE KNEE AMPUTATION (Left: Knee)     Patient location during evaluation: PACU Anesthesia Type: General Level of consciousness: awake and alert Pain management: pain level controlled Vital Signs Assessment: post-procedure vital signs reviewed and stable Respiratory status: spontaneous breathing, nonlabored ventilation, respiratory function stable and patient connected to nasal cannula oxygen Cardiovascular status: blood pressure returned to baseline and stable Postop Assessment: no apparent nausea or vomiting Anesthetic complications: no   No notable events documented.  Last Vitals:  Vitals:   07/27/23 1831 07/27/23 1912  BP: (!) 146/90 (!) 145/78  Pulse: 79 79  Resp: 17 18  Temp: 36.4 C 36.7 C  SpO2: 91% 94%    Last Pain:  Vitals:   07/27/23 1938  TempSrc:   PainSc: 6                  Trevor Iha

## 2023-07-27 NOTE — Progress Notes (Signed)
[facility] Pre-op Review 1.Surgical orders: Consent orders:placed Consent signed: per chart yes Patient alert and oriented:  x4 Antibiotic: Ancef to OR, TXA  Pre-meds: povidine swab  2.  Pre-procedure checklist completed: asked RN To complete  3.  NPO: per order  4.  CHG Bath completed: asked RN to complete      Belongings removed and placed in clean gown: asked RN to complete  5.  Labs Performed: CBC Hgb 7.5-10/29 CMP/BMP NA-132 PT/INR 10/28-2.5/27.2 HA1C 10.2-10/29 Type and Screen 10/28 Surgical PCR: + Pregnancy: na EKG: last 02/06/23 Chest x-ray: na   6.  Recent H&P or progress note if inpatient: Adam Long, 10/30  7.  Language Barrier:  none  8.  Vital Signs: WNL Oxygen: RA 98% Tele: NA Can the patient travel without Tele  9.  Medications: Cardiac Drips: na Pain Medications: Beta Blocker:  Anticoagulants: coumadin at home LD 10/27 GLP1:  10. IV access: 20gLAC  11. Diabetic:     CBG:  281  @   0741-8units novolog given  Perioperative Glycemic Control Scale:    Moderate   12. Additional Information that short stay should be aware of?

## 2023-07-27 NOTE — Op Note (Signed)
07/27/2023  5:12 PM  PATIENT:  Adam Long    PRE-OPERATIVE DIAGNOSIS:  Dehiscence Left Below Knee Amputation  POST-OPERATIVE DIAGNOSIS:  Same  PROCEDURE:  LEFT ABOVE KNEE AMPUTATION Application Kerecis micro graft 38 cm. Application of customizable wound VAC dressing.  SURGEON:  Nadara Mustard, MD  PHYSICIAN ASSISTANT:None ANESTHESIA:   General  PREOPERATIVE INDICATIONS:  Adam Long is a  47 y.o. male with a diagnosis of Dehiscence Left Below Knee Amputation who failed conservative measures and elected for surgical management.    The risks benefits and alternatives were discussed with the patient preoperatively including but not limited to the risks of infection, bleeding, nerve injury, cardiopulmonary complications, the need for revision surgery, among others, and the patient was willing to proceed.  OPERATIVE IMPLANTS:   Implant Name Type Inv. Item Serial No. Manufacturer Lot No. LRB No. Used Action  GRAFT SKIN WND MICRO 38 - [id number] Tissue GRAFT SKIN WND MICRO 38  [employer] [id number] Left 1 Implanted    @ENCIMAGES @  OPERATIVE FINDINGS: Patient had clean margins.  There was good petechial bleeding.  OPERATIVE PROCEDURE: Patient brought the operating room and underwent a general anesthetic.  After adequate levels anesthesia were obtained patient's left lower extremity was prepped using DuraPrep draped into a sterile field.  The infected below-knee amputation was draped out of the sterile field with impervious stockinette.  A timeout was called.  A fishmouth incision was made proximal to the patella.  This was carried down to muscle extra-articular.  This was carried down to the medial septum the vascular bundle was clamped and suture-ligated with 2-0 silk.  The remainder of the amputation was completed a reciprocating saw was used to complete the transfemoral amputation.  Electrocautery was used for further hemostasis.  The wound was irrigated with Vashe.  The  wound bed was filled with 38 cm of Kerecis micro graft.  The deep and superficial fascial layers were closed using #1 Vicryl.  Skin was closed using staples.  The Prevena customizable wound VAC was applied this had a good suction fit patient was extubated taken the PACU in stable condition.   DISCHARGE PLANNING:  Antibiotic duration: Antibiotics for 24 hours.  Weightbearing: Nonweightbearing on the left  Pain medication: Opioid pathway.  Dressing care/ Wound VAC: Continue wound VAC for 1 week  Ambulatory devices: Walker  Discharge to: Anticipate discharge to skilled nursing.  Follow-up: In the office 1 week post operative.

## 2023-07-27 NOTE — Consult Note (Signed)
ORTHOPAEDIC CONSULTATION  REQUESTING PHYSICIAN: Glade Lloyd, MD  Chief Complaint: Dehiscence with purulent drainage left below-knee amputation.  HPI: Adam Long is a 47 y.o. male who presents with purulent drainage and wound dehiscence left below-knee amputation.  Patient states he has been wearing his prosthetic leg.  Patient is status post left below-knee amputation revision May 4.  Past Medical History:  Diagnosis Date   Anxiety    Aortic stenosis    Cellulitis and abscess of lower extremity 06/11/2019   Cellulitis of fourth toe of left foot    Cholelithiasis    Coronary artery disease    Nonobstructive CAD (40-50% LAD) 08/2019   Depression    Diabetes mellitus without complication (HCC)    Phreesia 09/27/2020   Elevated troponin level not due myocardial infarction 11/11/2019   Essential hypertension    Gangrene of toe of left foot (HCC) 07/06/2019   Heart murmur    Phreesia 09/27/2020   Hyperlipidemia    Phreesia 09/27/2020   Hypertension    Phreesia 09/27/2020   Mixed hyperlipidemia    Morbid obesity (HCC)    S/P aortic valve replacement with mechanical valve 12/05/2019   25 mm Carbomedics top hat bileaflet mechanical valve via partial upper hemi-sternotomy   Severe aortic stenosis 09/24/2019   Type 2 diabetes mellitus (HCC)    Past Surgical History:  Procedure Laterality Date   ABDOMINAL AORTOGRAM W/LOWER EXTREMITY N/A 07/06/2019   Procedure: ABDOMINAL AORTOGRAM W/LOWER EXTREMITY;  Surgeon: Sherren Kerns, MD;  Location: [facility] INVASIVE CV LAB;  Service: Cardiovascular;  Laterality: N/A;  Bilateral   AMPUTATION Left 07/09/2019   Procedure: LEFT FOURTH and Fifth TOE AMPUTATION.;  Surgeon: Larina Earthly, MD;  Location: [facility] OR;  Service: Vascular;  Laterality: Left;   AMPUTATION Left 03/11/2021   Procedure: LEFT FOOT 5TH  AND 4TH RAY AMPUTATION;  Surgeon: Nadara Mustard, MD;  Location: [facility] OR;  Service: Orthopedics;  Laterality: Left;   AMPUTATION Left 08/28/2021    Procedure: AMPUTATION BELOW KNEE;  Surgeon: Nadara Mustard, MD;  Location: [facility] OR;  Service: Orthopedics;  Laterality: Left;   AMPUTATION Left 01/29/2023   Procedure: LEFT AMPUTATION BELOW KNEE REVISION AND CLOSURE;  Surgeon: Nadara Mustard, MD;  Location: [facility] OR;  Service: Orthopedics;  Laterality: Left;   AORTIC VALVE REPLACEMENT N/A 12/05/2019   Procedure: PARTIAL STERNOTOMY FOR AORTIC VALVE REPLACEMENT (AVR), USING CARBOMEDICS SUPRA-ANNULAR TOP HAT ;  Surgeon: Purcell Nails, MD;  Location: [facility] OR;  Service: Open Heart Surgery;  Laterality: N/A;  No neck lines on left   APPLICATION OF WOUND VAC Left 10/30/2021   Procedure: APPLICATION OF WOUND VAC;  Surgeon: Nadara Mustard, MD;  Location: [facility] OR;  Service: Orthopedics;  Laterality: Left;   APPLICATION OF WOUND VAC Left 12/25/2021   Procedure: APPLICATION OF WOUND VAC;  Surgeon: Nadara Mustard, MD;  Location: [facility] OR;  Service: Orthopedics;  Laterality: Left;   APPLICATION OF WOUND VAC Left 01/26/2023   Procedure: APPLICATION OF WOUND VAC TO LEFT STUMP;  Surgeon: Tarry Kos, MD;  Location: [facility] OR;  Service: Orthopedics;  Laterality: Left;   CARDIAC VALVE REPLACEMENT N/A    Phreesia 09/27/2020   I & D EXTREMITY Left 01/26/2023   Procedure: IRRIGATION AND DEBRIDEMENT OF LEFT BKA STUMP;  Surgeon: Tarry Kos, MD;  Location: [facility] OR;  Service: Orthopedics;  Laterality: Left;   IR RADIOLOGY PERIPHERAL GUIDED IV START  10/05/2019   IR US GUIDE VASC ACCESS RIGHT  10/05/2019   MULTIPLE EXTRACTIONS WITH ALVEOLOPLASTY N/A 10/26/2019   Procedure: EXTRACTION OF TOOTH #'S 3, 5-11,19-28,  AND 32 WITH ALVEOLOPLASTY;  Surgeon: Charlynne Pander, DDS;  Location: [facility] OR;  Service: Oral Surgery;  Laterality: N/A;   RIGHT HEART CATH AND CORONARY ANGIOGRAPHY N/A 09/24/2019   Procedure: RIGHT HEART CATH AND CORONARY ANGIOGRAPHY;  Surgeon: Tonny Bollman, MD;  Location: [facility] INVASIVE CV LAB;  Service: Cardiovascular;  Laterality: N/A;   STUMP REVISION Left 10/30/2021    Procedure: REVISION LEFT BELOW KNEE AMPUTATION;  Surgeon: Nadara Mustard, MD;  Location: [facility] OR;  Service: Orthopedics;  Laterality: Left;   STUMP REVISION Left 12/25/2021   Procedure: REVISION LEFT BELOW KNEE AMPUTATION;  Surgeon: Nadara Mustard, MD;  Location: [facility] OR;  Service: Orthopedics;  Laterality: Left;   TEE WITHOUT CARDIOVERSION N/A 12/05/2019   Procedure: TRANSESOPHAGEAL ECHOCARDIOGRAM (TEE);  Surgeon: Purcell Nails, MD;  Location: [facility] OR;  Service: Open Heart Surgery;  Laterality: N/A;   TEE WITHOUT CARDIOVERSION N/A 09/01/2021   Procedure: TRANSESOPHAGEAL ECHOCARDIOGRAM (TEE);  Surgeon: Little Ishikawa, MD;  Location: [facility] ENDOSCOPY;  Service: Cardiovascular;  Laterality: N/A;   Social History   Socioeconomic History   Marital status: Single    Spouse name: Not on file   Number of children: Not on file   Years of education: Not on file   Highest education level: 12th grade  Occupational History    Comment: Glass blower/designer- self-employed  Tobacco Use   Smoking status: Never   Smokeless tobacco: Former    Types: Chew    Quit date: 2021  Vaping Use   Vaping status: Never Used  Substance and Sexual Activity   Alcohol use: Yes    Alcohol/week: 4.0 standard drinks of alcohol    Types: 4 Cans of beer per week    Comment: occasionally   Drug use: No   Sexual activity: Yes    Birth control/protection: None  Other Topics Concern   Not on file  Social History Narrative   Lives with his friend.    Social Determinants of Health   Financial Resource Strain: Medium Risk (02/21/2023)   Overall Financial Resource Strain (CARDIA)    Difficulty of Paying Living Expenses: Somewhat hard  Food Insecurity: No Food Insecurity (02/21/2023)   Hunger Vital Sign    Worried About Running Out of Food in the Last Year: Never true    Ran Out of Food in the Last Year: Never true  Transportation Needs: No Transportation Needs (02/21/2023)   PRAPARE - Scientist, research (physical sciences) (Medical): No    Lack of Transportation (Non-Medical): No  Physical Activity: Insufficiently Active (02/21/2023)   Exercise Vital Sign    Days of Exercise per Week: 1 day    Minutes of Exercise per Session: 10 min  Stress: Stress Concern Present (02/21/2023)   [employer] of Occupational Health - Occupational Stress Questionnaire    Feeling of Stress : Rather much  Social Connections: Socially Isolated (02/21/2023)   Social Connection and Isolation Panel [NHANES]    Frequency of Communication with Friends and Family: More than three times a week    Frequency of Social Gatherings with Friends and Family: Twice a week    Attends Religious Services: Never    Database administrator or Organizations: No    Attends Engineer, structural: Not on file    Marital Status: Never married   Family History  Problem Relation Age of  Onset   Cancer Mother        Brain tumor   Heart disease Father    Hyperlipidemia Father    Hypertension Father    Stroke Father    Heart murmur Sister    Heart attack Maternal Grandmother    Liver disease Maternal Grandfather    Breast cancer Paternal Grandmother    COPD Paternal Grandfather    Heart attack Maternal Aunt    Colon cancer Maternal Uncle 67   Pancreatic cancer Paternal Aunt    Heart disease Paternal Uncle    Prostate cancer Neg Hx    - negative except otherwise stated in the family history section No Known Allergies Prior to Admission medications   Medication Sig Start Date End Date Taking? Authorizing Provider  Continuous Glucose Sensor (FREESTYLE LIBRE 3 SENSOR) MISC Place 1 sensor on the skin every 14 days. Use to check glucose continuously 02/28/23   [name], DO  ezetimibe (ZETIA) 10 MG tablet Take 1 tablet (10 mg total) by mouth daily. 10/12/22  Yes Cook, Jayce G, DO  gabapentin (NEURONTIN) 300 MG capsule Take 2 capsules (600 mg total) by mouth 2 (two) times daily. 02/25/23  Yes Cook, Jayce G, DO  insulin  glargine (LANTUS) 100 UNIT/ML Solostar Pen Inject 30 Units into the skin daily. 02/04/23  Yes Rhetta Mura, MD  lisinopril (ZESTRIL) 20 MG tablet Take 1 tablet (20 mg total) by mouth daily. 10/12/22  Yes Cook, Jayce G, DO  metFORMIN (GLUCOPHAGE) 500 MG tablet Take 2 tablets (1,000 mg total) by mouth 2 (two) times daily with a meal. 10/12/22  Yes Cook, Jayce G, DO  metoprolol tartrate (LOPRESSOR) 25 MG tablet Take 1 tablet (25 mg total) by mouth 2 (two) times daily. 10/12/22  Yes Cook, Jayce G, DO  pantoprazole (PROTONIX) 40 MG tablet Take 1 tablet (40 mg total) by mouth daily. 10/12/22  Yes Cook, Jayce G, DO  sertraline (ZOLOFT) 50 MG tablet Take 1 tablet (50 mg total) by mouth daily. 10/12/22  Yes Cook, Jayce G, DO  warfarin (COUMADIN) 10 MG tablet TAKE 1 TABLET BY MOUTH DAILY EXCEPT TAKE  ONE-HALF TABLET  ON MONDAYS, WEDNESDAYS AND FRIDAYS OR AS DIRECTED Patient taking differently: Take 5-10 mg by mouth See admin instructions. TAKE 1 TABLET BY MOUTH DAILY EXCEPT TAKE  ONE-HALF TABLET  ON MONDAYS, WEDNESDAYS AND FRIDAYS OR AS DIRECTED 02/28/23  Yes Jonelle Sidle, MD  Zinc Sulfate 220 (50 Zn) MG TABS Take 1 tablet (220 mg total) by mouth daily. 02/04/23  Yes Rhetta Mura, MD  Insulin Pen Needle 32G X 4 MM MISC Use with Lantus flexpen 02/03/23   Rhetta Mura, MD   DG Knee 2 Views Left  Result Date: 07/25/2023 CLINICAL DATA:  Wound infection, diabetes EXAM: LEFT KNEE - 1-2 VIEW COMPARISON:  01/23/2023 FINDINGS: Frontal and lateral views of the left knee are obtained. Prior below-knee amputation. No acute fracture, subluxation, or dislocation. Joint spaces are well preserved. There is diffuse subcutaneous edema. No evidence of subcutaneous gas or radiopaque foreign body. No evidence of bony erosion or periosteal reaction to suggest acute osteomyelitis. IMPRESSION: 1. Diffuse soft tissue swelling consistent with cellulitis. 2. No evidence of bony destruction or periosteal reaction to  suggest acute osteomyelitis. The appearance of the below-knee amputation site is similar to prior CT exam. Electronically Signed   By: Sharlet Salina M.D.   On: 07/25/2023 19:12   DG Chest 2 View  Result Date: 07/25/2023 CLINICAL DATA:  Diabetes, hypertension,  wound infection EXAM: CHEST - 2 VIEW COMPARISON:  02/06/2023 FINDINGS: Frontal and lateral views of the chest demonstrate stable enlargement of the cardiac silhouette. No airspace disease, effusion, or pneumothorax. No acute bony abnormalities. IMPRESSION: 1. Stable chest, no acute process. Electronically Signed   By: Sharlet Salina M.D.   On: 07/25/2023 19:10   - pertinent xrays, CT, MRI studies were reviewed and independently interpreted  Positive ROS: All other systems have been reviewed and were otherwise negative with the exception of those mentioned in the HPI and as above.  Physical Exam: General: Alert, no acute distress Psychiatric: Patient is competent for consent with normal mood and affect Lymphatic: No axillary or cervical lymphadenopathy Cardiovascular: No pedal edema Respiratory: No cyanosis, no use of accessory musculature GI: No organomegaly, abdomen is soft and non-tender    Images:  @ENCIMAGES @  Labs:  Lab Results  Component Value Date   HGBA1C 10.2 (H) 07/26/2023   HGBA1C 10.2 (H) 07/25/2023   HGBA1C 13.4 (H) 01/24/2023   ESRSEDRATE 60 (H) 09/14/2021   ESRSEDRATE 65 (H) 09/07/2021   ESRSEDRATE 104 (H) 08/29/2021   CRP 1.5 (H) 09/14/2021   CRP 3.4 (H) 09/07/2021   CRP 12.8 (H) 08/29/2021   LABURIC 7.9 02/07/2023   REPTSTATUS PENDING 07/25/2023   GRAMSTAIN  01/26/2023    RARE WBC PRESENT, PREDOMINANTLY PMN FEW GRAM POSITIVE COCCI IN PAIRS FEW GRAM POSITIVE COCCI IN CLUSTERS    CULT  07/25/2023    NO GROWTH 2 DAYS Performed at [facility], [address], [place], [place] [address]    LABORGA KLEBSIELLA PNEUMONIAE (A) 06/28/2023    Lab Results  Component Value Date   ALBUMIN 2.4 (L)  07/26/2023   ALBUMIN 2.6 (L) 07/25/2023   ALBUMIN 3.3 (L) 06/27/2023   PREALBUMIN 17 (L) 07/26/2023   PREALBUMIN 21.6 10/29/2021   LABURIC 7.9 02/07/2023        Latest Ref Rng & Units 07/26/2023    6:43 AM 07/25/2023    3:44 PM 06/27/2023    9:00 PM  CBC EXTENDED  WBC 4.0 - 10.5 K/uL 8.5  8.6  11.9   RBC 4.22 - 5.81 MIL/uL 3.55  3.85  4.01   Hemoglobin 13.0 - 17.0 g/dL 7.5  8.1  8.8   HCT 60.4 - 52.0 % 26.0  28.4  28.8   Platelets 150 - 400 K/uL 365  390  239   NEUT# 1.7 - 7.7 K/uL  6.3    Lymph# 0.7 - 4.0 K/uL  1.3      Neurologic: Patient does not have protective sensation bilateral lower extremities.   MUSCULOSKELETAL:   Skin: Examination patient has induration of the residual limb with purulent drainage with a ulcer that extends to bone.  Patient has a very short transtibial amputation.  Hemoglobin 7.5 with a white cell count of 8.5.  Albumin 2.4.  Hemoglobin A1c 10.2.  Radiograph showed no destructive bony changes.  Assessment: Assessment: Uncontrolled type 2 diabetes with dehiscence of a short below-knee amputation on the left with exposed bone purulent drainage.  Plan: Plan: Discussed with the patient he does not have revision options for the left below-knee amputation.  Discussed that we would need to proceed with a left above-knee amputation.  Risk and benefits were discussed.  Patient states he understands wished to proceed at this time.  Thank you for the consult and the opportunity to see Mr. Trasean Holton, MD [facility] [phone] 7:36 AM

## 2023-07-27 NOTE — Transfer of Care (Signed)
Immediate Anesthesia Transfer of Care Note  Patient: Adam Long  Procedure(s) Performed: LEFT ABOVE KNEE AMPUTATION (Left: Knee)  Patient Location: PACU  Anesthesia Type:General  Level of Consciousness: awake, alert , and oriented  Airway & Oxygen Therapy: Patient Spontanous Breathing and Patient connected to face mask oxygen  Post-op Assessment: Report given to RN and Post -op Vital signs reviewed and stable  Post vital signs: Reviewed and stable  Last Vitals:  Vitals Value Taken Time  BP 158/79 07/27/23 1720  Temp 36.5 C 07/27/23 1720  Pulse 80 07/27/23 1720  Resp 16 07/27/23 1720  SpO2 99 % 07/27/23 1720  Vitals shown include unfiled device data.  Last Pain:  Vitals:   07/27/23 1439  TempSrc: Oral  PainSc: 0-No pain      Patients Stated Pain Goal: 2 (07/27/23 1439)  Complications: No notable events documented.

## 2023-07-27 NOTE — Telephone Encounter (Signed)
Adam Long DENIED  The following criteria from the health plan guideline, Systems (CGM) and Related Supplies, must be met before we can approve this request.   Please send [place] supporting chart notes and lab results.  1. the beneficiary has been using the CGM system as prescribed; and,  2. the beneficiary has been able to improve glycemic control; or,  3. the beneficiary continues to use an external insulin pump.  It appears the patient is in for AKA/surgery today He will needs f/u PCP appt & labs to document these requirements

## 2023-07-27 NOTE — Progress Notes (Signed)
PROGRESS NOTE    Adam Long  JWJ:[id number] DOB: 11/06/1975 DOA: 07/25/2023 PCP: [name], DO   Brief Narrative:  47yo with h/o CAD, HTN, HLD, s/p AVR on Coumadin, morbid obesity, DM, and PAD s/p L BKA with stump dehiscence and MRSA infection presented on 10/28 with recurrent stump infection.  He was started on broad-spectrum antibiotics. Orthopedics consulted.   Assessment & Plan:   Left BKA stump infection Underwent BKA in 08/2021 and revisions in 10/2021, 11/2021, 01/2023 Now with signs of stump infection including erythema and purulent drainage X-ray showing diffuse soft tissue swelling consistent with cellulitis but no evidence of acute osteomyelitis. Negative lactate, no current concerns for sepsis Currently on broad-spectrum IV antibiotics  Orthopedics following and planning for possible surgical intervention today.  Continue n.p.o. for now   Anemia of chronic disease Possibly from chronic illnesses.  Monitor intermittently.  Labs pending today.  transfuse for Hgb <7   Acute on chronic hyponatremia Sodium 128 on presentation.  Improved to 132 on 07/26/2023.  Monitor intermittently.  Labs pending today.   Poorly controlled insulin-dependent type 2 diabetes A1c 10.2. Continue long-acting insulin along with CBGs with SSI.  Metformin on hold.     ?UTI UA with negative nitrite, trace leukocytes, and microscopy showing >50 RBCs, >50 WBCs, and few bacteria Patient is not endorsing any urinary symptoms, vomiting, or abdominal/flank pain at present Already on antibiotics for stump site infection/cellulitis Urine culture pending, likely asymptomatic bacteriuria He had Klebsiella on prior culture on 10/1, would be appropriately treated with current abx assuming similar sensitivities if UTI is present   Microscopic hematuria CT done 06/28/2023 showing nonobstructing left renal stones Patient denies any abdominal or flank pain at present   CAD Not endorsing any anginal  symptoms.  Continue ezetimibe and metoprolol.   Hypertension Continue metoprolol Resume lisinopril (not clear why this was discontinued after admission)   Hyperlipidemia Continue Zetia   Mechanical aortic valve Pharmacy following.  Coumadin on hold.  Heparin drip held this morning for possible surgical intervention.   Anxiety and depression Continue Zoloft   Chronic pain/neuropathy Continue gabapentin   GERD Continue Protonix   Morbid obesity -Outpatient follow-up   DVT prophylaxis: Heparin held this morning Code Status: Full Family Communication: None at bedside Disposition Plan: Status is: Inpatient Remains inpatient appropriate because: Of severity of illness    Consultants: Orthopedics  Procedures: None  Antimicrobials:  Anti-infectives (From admission, onward)    Start     Dose/Rate Route Frequency Ordered Stop   07/27/23 1700  ceFAZolin (ANCEF) IVPB 3g/100 mL premix        3 g 200 mL/hr over 30 Minutes Intravenous On call to O.R. 07/26/23 2211 07/28/23 0559   07/27/23 0600  ceFAZolin (ANCEF) IVPB 3g/100 mL premix  Status:  Discontinued        3 g 200 mL/hr over 30 Minutes Intravenous On call to O.R. 07/26/23 2206 07/26/23 2211   07/26/23 2000  cefTRIAXone (ROCEPHIN) 2 g in sodium chloride 0.9 % 100 mL IVPB        2 g 200 mL/hr over 30 Minutes Intravenous Every 24 hours 07/25/23 2218     07/26/23 1100  vancomycin (VANCOREADY) IVPB 1750 mg/350 mL        1,750 mg 175 mL/hr over 120 Minutes Intravenous Every 12 hours 07/26/23 0102     07/26/23 1000  metroNIDAZOLE (FLAGYL) IVPB 500 mg        500 mg 100 mL/hr over 60 Minutes Intravenous  Every 12 hours 07/26/23 0852     07/25/23 2345  vancomycin (VANCOREADY) IVPB 2000 mg/400 mL        2,000 mg 200 mL/hr over 120 Minutes Intravenous  Once 07/25/23 2248 07/26/23 0120   07/25/23 2246  vancomycin (VANCOCIN) 2,500 mg in sodium chloride 0.9 % 500 mL IVPB  Status:  Discontinued        2,500 mg 262.5 mL/hr over  120 Minutes Intravenous  Once 07/25/23 2246 07/25/23 2248   07/25/23 2045  vancomycin (VANCOREADY) IVPB 1500 mg/300 mL  Status:  Discontinued        1,500 mg 150 mL/hr over 120 Minutes Intravenous  Once 07/25/23 2035 07/25/23 2246   07/25/23 2045  cefTRIAXone (ROCEPHIN) 2 g in sodium chloride 0.9 % 100 mL IVPB        2 g 200 mL/hr over 30 Minutes Intravenous  Once 07/25/23 2042 07/25/23 2146        Subjective: Patient seen and examined at bedside.  No fever, vomiting, chest pain reported.  Objective: Vitals:   07/26/23 1449 07/26/23 1931 07/26/23 2035 07/27/23 0507  BP: 135/69 134/69 134/69 (!) 151/77  Pulse: 73 81 81 73  Resp: 16 17  18   Temp: 98.4 F (36.9 C) 98.6 F (37 C)  98.4 F (36.9 C)  TempSrc: Oral Oral  Oral  SpO2: 98% 97%  98%  Weight:      Height:        Intake/Output Summary (Last 24 hours) at 07/27/2023 0715 Last data filed at 07/26/2023 2300 Gross per 24 hour  Intake --  Output 1501 ml  Net -1501 ml   Filed Weights   07/25/23 2109 07/25/23 2215  Weight: (!) 165.6 kg (!) 169.5 kg    Examination:  General exam: Appears calm and comfortable.  On room air. Respiratory system: Bilateral decreased breath sounds at bases with some scattered crackles Cardiovascular system: S1 & S2 heard, Rate controlled Gastrointestinal system: Abdomen is morbidly obese, nondistended, soft and nontender. Normal bowel sounds heard. Extremities: Left BKA with induration and purulent drainage  Central nervous system: Alert and oriented. No focal neurological deficits. Moving extremities Skin: No ecchymosis/lesions  psychiatry: Judgement and insight appear normal. Mood & affect appropriate.     Data Reviewed: I have personally reviewed following labs and imaging studies  CBC: Recent Labs  Lab 07/25/23 1544 07/26/23 0643  WBC 8.6 8.5  NEUTROABS 6.3  --   HGB 8.1* 7.5*  HCT 28.4* 26.0*  MCV 73.8* 73.2*  PLT 390 365   Basic Metabolic Panel: Recent Labs  Lab  07/25/23 1544 07/26/23 0643 07/26/23 2254  NA 128* 134* 132*  K 4.2 4.4 4.7  CL 98 100 100  CO2 22 25 23   GLUCOSE 293* 274* 260*  BUN 14 11 12   CREATININE 0.85 0.88 1.12  CALCIUM 8.2* 8.6* 8.6*   GFR: Estimated Creatinine Clearance: 128.7 mL/min (by C-G formula based on SCr of 1.12 mg/dL). Liver Function Tests: Recent Labs  Lab 07/25/23 1544 07/26/23 2254  AST 24 33  ALT 23 25  ALKPHOS 53 49  BILITOT 0.6 0.4  PROT 7.3 6.7  ALBUMIN 2.6* 2.4*   No results for input(s): "LIPASE", "AMYLASE" in the last 168 hours. No results for input(s): "AMMONIA" in the last 168 hours. Coagulation Profile: Recent Labs  Lab 07/25/23 2251 07/26/23 0643  INR 2.5* 2.6*   Cardiac Enzymes: No results for input(s): "CKTOTAL", "CKMB", "CKMBINDEX", "TROPONINI" in the last 168 hours. BNP (last 3 results) No  results for input(s): "PROBNP" in the last 8760 hours. HbA1C: Recent Labs    07/25/23 2251 07/26/23 2254  HGBA1C 10.2* 10.2*   CBG: Recent Labs  Lab 07/25/23 2313 07/26/23 0849 07/26/23 1105 07/26/23 1636 07/26/23 2116  GLUCAP 244* 296* 253* 199* 236*   Lipid Profile: No results for input(s): "CHOL", "HDL", "LDLCALC", "TRIG", "CHOLHDL", "LDLDIRECT" in the last 72 hours. Thyroid Function Tests: No results for input(s): "TSH", "T4TOTAL", "FREET4", "T3FREE", "THYROIDAB" in the last 72 hours. Anemia Panel: No results for input(s): "VITAMINB12", "FOLATE", "FERRITIN", "TIBC", "IRON", "RETICCTPCT" in the last 72 hours. Sepsis Labs: Recent Labs  Lab 07/25/23 1603 07/25/23 2107  LATICACIDVEN 1.6 1.7    Recent Results (from the past 240 hour(s))  Urine Culture     Status: Abnormal (Preliminary result)   Collection Time: 07/25/23  3:33 PM   Specimen: Urine, Random  Result Value Ref Range Status   Specimen Description URINE, RANDOM  Final   Special Requests   Final    NONE Reflexed from [id number] Performed at [facility], [address], [place], [place] [address]     Culture >=100,000 COLONIES/mL KLEBSIELLA PNEUMONIAE (A)  Final   Report Status PENDING  Incomplete  Surgical pcr screen     Status: Abnormal   Collection Time: 07/26/23 11:57 PM   Specimen: Nasal Mucosa; Nasal Swab  Result Value Ref Range Status   MRSA, PCR POSITIVE (A) NEGATIVE Final    Comment: RESULT CALLED TO, READ BACK BY AND VERIFIED WITH: A OPOKU,RN@0222  07/27/23 MK    Staphylococcus aureus POSITIVE (A) NEGATIVE Final    Comment: (NOTE) The Xpert SA Assay (FDA approved for NASAL specimens in patients 87 years of age and older), is one component of a comprehensive surveillance program. It is not intended to diagnose infection nor to guide or monitor treatment. Performed at [facility], [address] [place], [place] [address]          Radiology Studies: DG Knee 2 Views Left  Result Date: 07/25/2023 CLINICAL DATA:  Wound infection, diabetes EXAM: LEFT KNEE - 1-2 VIEW COMPARISON:  01/23/2023 FINDINGS: Frontal and lateral views of the left knee are obtained. Prior below-knee amputation. No acute fracture, subluxation, or dislocation. Joint spaces are well preserved. There is diffuse subcutaneous edema. No evidence of subcutaneous gas or radiopaque foreign body. No evidence of bony erosion or periosteal reaction to suggest acute osteomyelitis. IMPRESSION: 1. Diffuse soft tissue swelling consistent with cellulitis. 2. No evidence of bony destruction or periosteal reaction to suggest acute osteomyelitis. The appearance of the below-knee amputation site is similar to prior CT exam. Electronically Signed   By: Sharlet Salina M.D.   On: 07/25/2023 19:12   DG Chest 2 View  Result Date: 07/25/2023 CLINICAL DATA:  Diabetes, hypertension, wound infection EXAM: CHEST - 2 VIEW COMPARISON:  02/06/2023 FINDINGS: Frontal and lateral views of the chest demonstrate stable enlargement of the cardiac silhouette. No airspace disease, effusion, or pneumothorax. No acute bony abnormalities.  IMPRESSION: 1. Stable chest, no acute process. Electronically Signed   By: Sharlet Salina M.D.   On: 07/25/2023 19:10        Scheduled Meds:  ezetimibe  10 mg Oral Daily   gabapentin  600 mg Oral BID   insulin aspart  0-15 Units Subcutaneous TID WC   insulin aspart  0-5 Units Subcutaneous QHS   insulin glargine-yfgn  30 Units Subcutaneous Daily   lisinopril  20 mg Oral Daily   metoprolol tartrate  25 mg Oral BID   mupirocin cream   Topical BID   pantoprazole  40 mg Oral Daily   povidone-iodine  2 Application Topical Once   sertraline  50 mg Oral Daily   tranexamic acid (CYKLOKAPRON) 2,000 mg in sodium chloride 0.9 % 50 mL Topical Application  2,000 mg Topical To OR   Continuous Infusions:   ceFAZolin (ANCEF) IV     cefTRIAXone (ROCEPHIN)  IV 2 g (07/26/23 2039)   metronidazole 500 mg (07/26/23 2144)   tranexamic acid     vancomycin 1,750 mg (07/26/23 2257)          Glade Lloyd, MD [facility] 07/27/2023, 7:15 AM

## 2023-07-27 NOTE — Anesthesia Procedure Notes (Signed)
Procedure Name: LMA Insertion Date/Time: 07/27/2023 4:24 PM  Performed by: Pincus Large, CRNAPre-anesthesia Checklist: Patient identified, Emergency Drugs available, Suction available and Patient being monitored Patient Re-evaluated:Patient Re-evaluated prior to induction Oxygen Delivery Method: Circle System Utilized Preoxygenation: Pre-oxygenation with 100% oxygen Induction Type: IV induction Ventilation: Mask ventilation without difficulty LMA: LMA inserted LMA Size: 4.0 Number of attempts: 1 Airway Equipment and Method: Bite block Placement Confirmation: positive ETCO2 Tube secured with: Tape Dental Injury: Teeth and Oropharynx as per pre-operative assessment

## 2023-07-27 NOTE — Plan of Care (Signed)
  Problem: Education: Goal: Knowledge of General Education information will improve Description: Including pain rating scale, medication(s)/side effects and non-pharmacologic comfort measures Outcome: Progressing   Problem: Health Behavior/Discharge Planning: Goal: Ability to manage health-related needs will improve Outcome: Progressing   Problem: Clinical Measurements: Goal: Ability to maintain clinical measurements within normal limits will improve Outcome: Progressing Goal: Will remain free from infection Outcome: Progressing Goal: Diagnostic test results will improve Outcome: Progressing Goal: Respiratory complications will improve Outcome: Progressing Goal: Cardiovascular complication will be avoided Outcome: Progressing   Problem: Activity: Goal: Risk for activity intolerance will decrease Outcome: Progressing   Problem: Nutrition: Goal: Adequate nutrition will be maintained Outcome: Progressing   Problem: Coping: Goal: Level of anxiety will decrease Outcome: Progressing   Problem: Elimination: Goal: Will not experience complications related to bowel motility Outcome: Progressing Goal: Will not experience complications related to urinary retention Outcome: Progressing   Problem: Pain Management: Goal: General experience of comfort will improve Outcome: Progressing   Problem: Safety: Goal: Ability to remain free from injury will improve Outcome: Progressing   Problem: Skin Integrity: Goal: Risk for impaired skin integrity will decrease Outcome: Progressing   Problem: Clinical Measurements: Goal: Ability to avoid or minimize complications of infection will improve Outcome: Progressing   Problem: Skin Integrity: Goal: Skin integrity will improve Outcome: Progressing   Problem: Education: Goal: Ability to describe self-care measures that may prevent or decrease complications (Diabetes Survival Skills Education) will improve Outcome: Progressing Goal:  Individualized Educational Video(s) Outcome: Progressing   Problem: Coping: Goal: Ability to adjust to condition or change in health will improve Outcome: Progressing   Problem: Fluid Volume: Goal: Ability to maintain a balanced intake and output will improve Outcome: Progressing   Problem: Health Behavior/Discharge Planning: Goal: Ability to identify and utilize available resources and services will improve Outcome: Progressing Goal: Ability to manage health-related needs will improve Outcome: Progressing   Problem: Metabolic: Goal: Ability to maintain appropriate glucose levels will improve Outcome: Progressing   Problem: Nutritional: Goal: Maintenance of adequate nutrition will improve Outcome: Progressing Goal: Progress toward achieving an optimal weight will improve Outcome: Progressing   Problem: Skin Integrity: Goal: Risk for impaired skin integrity will decrease Outcome: Progressing   Problem: Tissue Perfusion: Goal: Adequacy of tissue perfusion will improve Outcome: Progressing

## 2023-07-27 NOTE — Anesthesia Preprocedure Evaluation (Signed)
Anesthesia Evaluation  Patient identified by MRN, date of birth, ID band Patient awake    Reviewed: Allergy & Precautions, NPO status , Patient's Chart, lab work & pertinent test results  Airway Mallampati: III  TM Distance: >3 FB Neck ROM: Full    Dental  (+) Edentulous Upper, Edentulous Lower   Pulmonary neg pulmonary ROS   breath sounds clear to auscultation       Cardiovascular hypertension, Pt. on medications and Pt. on home beta blockers + CAD  + Valvular Problems/Murmurs AS  Rhythm:Regular Rate:Normal  - s/p AVR  Echo:  1. Left ventricular ejection fraction, by estimation, is 60 to 65%. The  left ventricle has normal function. The left ventricle has no regional  wall motion abnormalities.   2. Right ventricular systolic function is normal. The right ventricular  size is mildly enlarged.   3. No left atrial/left atrial appendage thrombus was detected.   4. The mitral valve is normal in structure. Trivial mitral valve  regurgitation.   5. There is a 25 mm bileaflet valve present in the aortic position      Aortic valve regurgitation is not visualized. Echo findings are  consistent with normal structure and function of the aortic valve  prosthesis. Aortic valve mean gradient measures 13.0 mmHg.     Neuro/Psych  PSYCHIATRIC DISORDERS Anxiety Depression    negative neurological ROS     GI/Hepatic Neg liver ROS,GERD  Medicated,,  Endo/Other  diabetes, Type 2, Oral Hypoglycemic Agents    Renal/GU negative Renal ROSLab Results      Component                Value               Date                      NA                       134 (L)             07/27/2023                CL                       101                 07/27/2023                K                        4.2                 07/27/2023                CO2                      26                  07/27/2023                BUN                      12                   07/27/2023  CREATININE               1.01                07/27/2023                GFRNONAA                 >60                 07/27/2023                CALCIUM                  8.8 (L)             07/27/2023                     GLUCOSE                  285 (H)             07/27/2023                Musculoskeletal negative musculoskeletal ROS (+)    Abdominal   Peds  Hematology  (+) Blood dyscrasia, anemia Lab Results      Component                Value               Date                      WBC                      8.5                 07/27/2023                HGB                      8.3 (L)             07/27/2023                HCT                      29.3 (L)            07/27/2023                MCV                      73.4 (L)            07/27/2023                PLT                      427 (H)             07/27/2023              Anesthesia Other Findings   Reproductive/Obstetrics                              Anesthesia Physical Anesthesia Plan  ASA: 3  Anesthesia Plan: General   Post-op Pain Management: Gabapentin PO (pre-op)* and Ofirmev IV (intra-op)*   Induction: Intravenous  PONV  Risk Score and Plan: 3 and Ondansetron, Dexamethasone and Midazolam  Airway Management Planned: LMA  Additional Equipment: None  Intra-op Plan:   Post-operative Plan: Extubation in OR  Informed Consent: I have reviewed the patients History and Physical, chart, labs and discussed the procedure including the risks, benefits and alternatives for the proposed anesthesia with the patient or authorized representative who has indicated his/her understanding and acceptance.     Dental advisory given  Plan Discussed with: CRNA  Anesthesia Plan Comments:         Anesthesia Quick Evaluation

## 2023-07-28 ENCOUNTER — Encounter (HOSPITAL_COMMUNITY): Payer: Self-pay | Admitting: Orthopedic Surgery

## 2023-07-28 DIAGNOSIS — T8740 Infection of amputation stump, unspecified extremity: Secondary | ICD-10-CM | POA: Diagnosis not present

## 2023-07-28 LAB — GLUCOSE, CAPILLARY
Glucose-Capillary: 264 mg/dL — ABNORMAL HIGH (ref 70–99)
Glucose-Capillary: 224 mg/dL — ABNORMAL HIGH (ref 70–99)
Glucose-Capillary: 298 mg/dL — ABNORMAL HIGH (ref 70–99)
Glucose-Capillary: 241 mg/dL — ABNORMAL HIGH (ref 70–99)

## 2023-07-28 MED ORDER — WARFARIN - PHARMACIST DOSING INPATIENT: Freq: Every day | Status: DC

## 2023-07-28 MED ORDER — WARFARIN SODIUM 5 MG PO TABS
10.0000 mg | ORAL_TABLET | Freq: Once | ORAL | Status: AC
  Administered 2023-07-28: 10 mg via ORAL
  Filled 2023-07-28: qty 2

## 2023-07-28 NOTE — Evaluation (Signed)
Physical Therapy Evaluation Patient Details Name: Adam Long MRN: [id number] DOB: April 25, 1976 Today's Date: 07/28/2023  History of Present Illness  Pt is a 47 y.o. male admitted 10/28 due to L BKA infection/dehiscence. He underwent L AKA 10/30. PMH: CAD, HTN, hyperlipidemia, severe aortic stenosis status post mechanical AVR on Coumadin, IDDM, L BKA with last revision surgery in May 2024 due to dehiscence, history of MRSA stump infection, anxiety, depression, morbid obesity   Clinical Impression  Pt admitted with above diagnosis. PTA pt lived at home with his uncle, mod I mobility with L BKA prosthesis. Pt currently with functional limitations due to the deficits listed below (see PT Problem List). On eval, pt required mod assist bed mobility, and mod assist lateral scoots along EOB. Unable to tolerate progress OOB due to pain. Pt will benefit from acute skilled PT to increase their independence and safety with mobility to allow discharge. Pt is highly motivated to return to independent level. He has good family support. Upon d/c, recommend further therapy in inpatient setting. Pt shows good ability to tolerate therapy > 3 hours/day.         If plan is discharge home, recommend the following: Help with stairs or ramp for entrance;Assist for transportation;Assistance with cooking/housework;Two people to help with walking and/or transfers;Two people to help with bathing/dressing/bathroom   Can travel by private vehicle        Equipment Recommendations Other (comment) (TBD)  Recommendations for Other Services  Rehab consult;OT consult    Functional Status Assessment Patient has had a recent decline in their functional status and demonstrates the ability to make significant improvements in function in a reasonable and predictable amount of time.     Precautions / Restrictions Precautions Precautions: Fall;Other (comment) Precaution Comments: wound vac L residual  limb Restrictions Weight Bearing Restrictions: Yes LLE Weight Bearing: Non weight bearing      Mobility  Bed Mobility Overal bed mobility: Needs Assistance Bed Mobility: Supine to Sit, Sit to Supine     Supine to sit: Mod assist, Used rails, HOB elevated Sit to supine: Mod assist   General bed mobility comments: assist with trunk and RLE, increased time, heavy use of bed rail    Transfers Overall transfer level: Needs assistance                 General transfer comment: lateral scoots along EOB mod assist. Deferred OOB due to pain.    Ambulation/Gait                  Stairs            Wheelchair Mobility     Tilt Bed    Modified Rankin (Stroke Patients Only)       Balance Overall balance assessment: Needs assistance Sitting-balance support: Feet supported, Single extremity supported Sitting balance-Leahy Scale: Fair                                       Pertinent Vitals/Pain Pain Assessment Pain Assessment: Faces Faces Pain Scale: Hurts even more Pain Location: L residual limb Pain Descriptors / Indicators: Grimacing, Guarding, Discomfort Pain Intervention(s): Limited activity within patient's tolerance, Monitored during session, Repositioned, Patient requesting pain meds-RN notified    Home Living Family/patient expects to be discharged to:: Private residence Living Arrangements: Other relatives Available Help at Discharge: Family;Friend(s);Available PRN/intermittently Type of Home: House Home Access: Stairs to enter Entrance  Stairs-Rails: None Entrance Stairs-Number of Steps: 2 short steps Alternate Level Stairs-Number of Steps: flight Home Layout: Two level;Able to live on main level with bedroom/bathroom Home Equipment: Rolling Walker (2 wheels);BSC/3in1;Wheelchair - manual;Tub bench Additional Comments: uncle, cousin also helps pt    Prior Function Prior Level of Function : Independent/Modified  Independent;Working/employed;Driving             Mobility Comments: Amb with L prosthesis and no AD. Uses RW or w/c when prosthesis not donned. ADLs Comments: mod I ADLs. Worked part time in [employer].     Extremity/Trunk Assessment   Upper Extremity Assessment Upper Extremity Assessment: Overall WFL for tasks assessed    Lower Extremity Assessment Lower Extremity Assessment: LLE deficits/detail LLE Deficits / Details: s/p AKA    Cervical / Trunk Assessment Cervical / Trunk Assessment: Normal  Communication   Communication Communication: No apparent difficulties  Cognition Arousal: Alert Behavior During Therapy: WFL for tasks assessed/performed Overall Cognitive Status: Within Functional Limits for tasks assessed                                          General Comments      Exercises     Assessment/Plan    PT Assessment Patient needs continued PT services  PT Problem List Decreased strength;Decreased balance;Pain;Decreased mobility;Decreased activity tolerance;Obesity;Decreased skin integrity       PT Treatment Interventions DME instruction;Functional mobility training;Balance training;Patient/family education;Therapeutic activities;Gait training;Therapeutic exercise    PT Goals (Current goals can be found in the Care Plan section)  Acute Rehab PT Goals Patient Stated Goal: independence PT Goal Formulation: With patient Time For Goal Achievement: 08/11/23 Potential to Achieve Goals: Good    Frequency Min 1X/week     Co-evaluation               AM-PAC PT "6 Clicks" Mobility  Outcome Measure Help needed turning from your back to your side while in a flat bed without using bedrails?: A Little Help needed moving from lying on your back to sitting on the side of a flat bed without using bedrails?: A Lot Help needed moving to and from a bed to a chair (including a wheelchair)?: Total Help needed standing up from a chair using  your arms (e.g., wheelchair or bedside chair)?: Total Help needed to walk in hospital room?: Total Help needed climbing 3-5 steps with a railing? : Total 6 Click Score: 9    End of Session   Activity Tolerance: Patient limited by pain Patient left: in bed;with call bell/phone within reach Nurse Communication: Patient requests pain meds PT Visit Diagnosis: Other abnormalities of gait and mobility (R26.89);Pain Pain - Right/Left: Left Pain - part of body: Leg    Time: 2956-2130 PT Time Calculation (min) (ACUTE ONLY): 17 min   Charges:   PT Evaluation $PT Eval Moderate Complexity: 1 Mod   PT General Charges $$ ACUTE PT VISIT: 1 Visit         Ferd Glassing., PT  Office # [phone]   Ilda Foil 07/28/2023, 10:56 AM

## 2023-07-28 NOTE — Progress Notes (Signed)
PHARMACY - ANTICOAGULATION CONSULT NOTE  Pharmacy Consult for Warfarin Indication: AVR replacement  No Known Allergies  Patient Measurements: Height: 5\' 10"  (177.8 cm) Weight: (!) 169.5 kg (373 lb 10.9 oz) IBW/kg (Calculated) : 73 Heparin Dosing Weight: 113.5  Vital Signs: Temp: 98.3 F (36.8 C) (10/31 0806) Temp Source: Oral (10/31 0806) BP: 112/62 (10/31 0806) Pulse Rate: 93 (10/31 0806)  Labs: Recent Labs    07/25/23 1544 07/25/23 2251 07/26/23 0643 07/26/23 2254 07/27/23 0839  HGB 8.1*  --  7.5*  --  8.3*  HCT 28.4*  --  26.0*  --  29.3*  PLT 390  --  365  --  427*  LABPROT  --  27.2* 28.4*  --  26.2*  INR  --  2.5* 2.6*  --  2.4*  CREATININE 0.85  --  0.88 1.12 1.01    Estimated Creatinine Clearance: 142.7 mL/min (by C-G formula based on SCr of 1.01 mg/dL).  Assessment: 47 yo male admitted for L BKA stump infection. Patient is on warfarin PTA for history of mechanical AVR.  PTA warfarin regimen: 5mg  MWF, 10mg  all other days. Last dose of warfarin was 10/28.   Now s/p surgery - resuming warfarin today  Goal of Therapy:  INR 2-3 Heparin level 0.3-0.7 units/ml Monitor platelets by anticoagulation protocol: Yes   Plan:  Warfarin 10 mg po x 1 dose today Daily INR  Thank you Okey Regal, PharmD  07/28/2023 8:27 AM

## 2023-07-28 NOTE — Progress Notes (Signed)
Inpatient Rehab Admissions Coordinator Note:   Per therapy recommendations patient was screened for CIR candidacy by Stephania Fragmin, PT. At this time, pt appears to be a potential candidate for CIR. I will place an order for rehab consult for full assessment, per our protocol.  Please contact me any with questions.Estill Dooms, PT, DPT [phone] 07/28/23 11:18 AM

## 2023-07-28 NOTE — Progress Notes (Signed)
Patient ID: Adam Long, male   DOB: [birth year], 47 y.o.   MRN: [id number] Patient is postoperative day 1 left above-knee amputation.  There is 25 cc in the wound VAC canister.  There is not a good suction fit.  Anticipate discontinuing the wound VAC and applying a dry dressing at time of discharge.

## 2023-07-28 NOTE — Progress Notes (Signed)
PROGRESS NOTE    Adam Long  NWG:[id number] DOB: 08-29-1976 DOA: 07/25/2023 PCP: [name], DO   Brief Narrative:  47yo with h/o CAD, HTN, HLD, s/p AVR on Coumadin, morbid obesity, DM, and PAD s/p L BKA with stump dehiscence and MRSA infection presented on 10/28 with recurrent stump infection.  He was started on broad-spectrum antibiotics.  He underwent left AKA on 07/27/2023.  Assessment & Plan:   Left BKA stump infection Underwent BKA in 08/2021 and revisions in 10/2021, 11/2021, 01/2023 Now with signs of stump infection including erythema and purulent drainage X-ray showing diffuse soft tissue swelling consistent with cellulitis but no evidence of acute osteomyelitis. Negative lactate, no current concerns for sepsis Currently on broad-spectrum IV antibiotics  -underwent left AKA and wound VAC placement on 07/27/2023.  Wound and VAC care as per orthopedics recommendations.  DC antibiotics 24 hours after surgery as per orthopedics recommendations.  Anemia of chronic disease Possibly from chronic illnesses.  Monitor intermittently.  Labs pending today.  transfuse for Hgb <7   Acute on chronic hyponatremia Sodium 128 on presentation.  Improved to 134 on 07/27/2023.  Monitor intermittently.  Labs pending today.   Poorly controlled insulin-dependent type 2 diabetes A1c 10.2. Continue long-acting insulin along with CBGs with SSI.  Metformin on hold.     ?UTI UA with negative nitrite, trace leukocytes, and microscopy showing >50 RBCs, >50 WBCs, and few bacteria Patient is not endorsing any urinary symptoms, vomiting, or abdominal/flank pain at present Already on antibiotics for stump site infection/cellulitis Urine culture pending, likely asymptomatic bacteriuria He had Klebsiella on prior culture on 10/1, would be appropriately treated with current abx assuming similar sensitivities if UTI is present   Microscopic hematuria CT done 06/28/2023 showing nonobstructing left renal  stones Patient denies any abdominal or flank pain at present   CAD Not endorsing any anginal symptoms.  Continue ezetimibe and metoprolol.   Hypertension Continue metoprolol and lisinopril   Hyperlipidemia Continue Zetia   Mechanical aortic valve Pharmacy following.  Resume anticoagulation once cleared by orthopedics.    Anxiety and depression Continue Zoloft   Chronic pain/neuropathy Continue gabapentin   GERD Continue Protonix   Morbid obesity -Outpatient follow-up   DVT prophylaxis: Anticoagulation on hold  code Status: Full Family Communication: None at bedside Disposition Plan: Status is: Inpatient Remains inpatient appropriate because: Of severity of illness    Consultants: Orthopedics  Procedures: As above  Antimicrobials:  Anti-infectives (From admission, onward)    Start     Dose/Rate Route Frequency Ordered Stop   07/27/23 1930  ceFAZolin (ANCEF) IVPB 2g/100 mL premix  Status:  Discontinued        2 g 200 mL/hr over 30 Minutes Intravenous Every 8 hours 07/27/23 1832 07/27/23 1836   07/27/23 1700  ceFAZolin (ANCEF) IVPB 3g/100 mL premix        3 g 200 mL/hr over 30 Minutes Intravenous On call to O.R. 07/26/23 2211 07/27/23 1652   07/27/23 1637  vancomycin (VANCOCIN) powder  Status:  Discontinued          As needed 07/27/23 1638 07/27/23 1724   07/27/23 0600  ceFAZolin (ANCEF) IVPB 3g/100 mL premix  Status:  Discontinued        3 g 200 mL/hr over 30 Minutes Intravenous On call to O.R. 07/26/23 2206 07/26/23 2211   07/26/23 2000  cefTRIAXone (ROCEPHIN) 2 g in sodium chloride 0.9 % 100 mL IVPB        2 g 200 mL/hr  over 30 Minutes Intravenous Every 24 hours 07/25/23 2218     07/26/23 1100  vancomycin (VANCOREADY) IVPB 1750 mg/350 mL        1,750 mg 175 mL/hr over 120 Minutes Intravenous Every 12 hours 07/26/23 0102     07/26/23 1000  metroNIDAZOLE (FLAGYL) IVPB 500 mg        500 mg 100 mL/hr over 60 Minutes Intravenous Every 12 hours 07/26/23 0852      07/25/23 2345  vancomycin (VANCOREADY) IVPB 2000 mg/400 mL        2,000 mg 200 mL/hr over 120 Minutes Intravenous  Once 07/25/23 2248 07/26/23 0120   07/25/23 2246  vancomycin (VANCOCIN) 2,500 mg in sodium chloride 0.9 % 500 mL IVPB  Status:  Discontinued        2,500 mg 262.5 mL/hr over 120 Minutes Intravenous  Once 07/25/23 2246 07/25/23 2248   07/25/23 2045  vancomycin (VANCOREADY) IVPB 1500 mg/300 mL  Status:  Discontinued        1,500 mg 150 mL/hr over 120 Minutes Intravenous  Once 07/25/23 2035 07/25/23 2246   07/25/23 2045  cefTRIAXone (ROCEPHIN) 2 g in sodium chloride 0.9 % 100 mL IVPB        2 g 200 mL/hr over 30 Minutes Intravenous  Once 07/25/23 2042 07/25/23 2146        Subjective: Patient seen and examined at bedside.  Complains of left lower extremity pain.  No fever or vomiting reported.  Objective: Vitals:   07/27/23 1815 07/27/23 1831 07/27/23 1912 07/28/23 0423  BP: (!) 150/82 (!) 146/90 (!) 145/78 120/69  Pulse: 77 79 79 93  Resp: 16 17 18 18   Temp: 97.7 F (36.5 C) 97.6 F (36.4 C) 98 F (36.7 C) 98.6 F (37 C)  TempSrc:  Oral Oral Oral  SpO2: 93% 91% 94% 97%  Weight:      Height:        Intake/Output Summary (Last 24 hours) at 07/28/2023 0803 Last data filed at 07/27/2023 2013 Gross per 24 hour  Intake 550 ml  Output 700 ml  Net -150 ml   Filed Weights   07/25/23 2109 07/25/23 2215  Weight: (!) 165.6 kg (!) 169.5 kg    Examination:  General: Currently on room air.  No distress ENT/neck: No thyromegaly.  JVD is not elevated  respiratory: Decreased breath sounds at bases bilaterally with some crackles; no wheezing  CVS: S1-S2 heard, rate controlled currently Abdominal: Soft, morbidly obese, nontender, slightly distended; no organomegaly, bowel sounds are heard Extremities: Left AKA with dressing and wound VAC present  CNS: Awake and alert.  No focal neurologic deficit.  Moves extremities Lymph: No obvious lymphadenopathy Skin: No  obvious ecchymosis/lesions  psych: Mostly flat affect.  Not agitated. musculoskeletal: No obvious other joint swelling/deformity     Data Reviewed: I have personally reviewed following labs and imaging studies  CBC: Recent Labs  Lab 07/25/23 1544 07/26/23 0643 07/27/23 0839  WBC 8.6 8.5 8.5  NEUTROABS 6.3  --  6.1  HGB 8.1* 7.5* 8.3*  HCT 28.4* 26.0* 29.3*  MCV 73.8* 73.2* 73.4*  PLT 390 365 427*   Basic Metabolic Panel: Recent Labs  Lab 07/25/23 1544 07/26/23 0643 07/26/23 2254 07/27/23 0839  NA 128* 134* 132* 134*  K 4.2 4.4 4.7 4.2  CL 98 100 100 101  CO2 22 25 23 26   GLUCOSE 293* 274* 260* 285*  BUN 14 11 12 12   CREATININE 0.85 0.88 1.12 1.01  CALCIUM 8.2* 8.6*  8.6* 8.8*   GFR: Estimated Creatinine Clearance: 142.7 mL/min (by C-G formula based on SCr of 1.01 mg/dL). Liver Function Tests: Recent Labs  Lab 07/25/23 1544 07/26/23 2254  AST 24 33  ALT 23 25  ALKPHOS 53 49  BILITOT 0.6 0.4  PROT 7.3 6.7  ALBUMIN 2.6* 2.4*   No results for input(s): "LIPASE", "AMYLASE" in the last 168 hours. No results for input(s): "AMMONIA" in the last 168 hours. Coagulation Profile: Recent Labs  Lab 07/25/23 2251 07/26/23 0643 07/27/23 0839  INR 2.5* 2.6* 2.4*   Cardiac Enzymes: No results for input(s): "CKTOTAL", "CKMB", "CKMBINDEX", "TROPONINI" in the last 168 hours. BNP (last 3 results) No results for input(s): "PROBNP" in the last 8760 hours. HbA1C: Recent Labs    07/25/23 2251 07/26/23 2254  HGBA1C 10.2* 10.2*   CBG: Recent Labs  Lab 07/27/23 1141 07/27/23 1440 07/27/23 1722 07/27/23 2113 07/28/23 0714  GLUCAP 251* 170* 182* 363* 264*   Lipid Profile: No results for input(s): "CHOL", "HDL", "LDLCALC", "TRIG", "CHOLHDL", "LDLDIRECT" in the last 72 hours. Thyroid Function Tests: No results for input(s): "TSH", "T4TOTAL", "FREET4", "T3FREE", "THYROIDAB" in the last 72 hours. Anemia Panel: No results for input(s): "VITAMINB12", "FOLATE",  "FERRITIN", "TIBC", "IRON", "RETICCTPCT" in the last 72 hours. Sepsis Labs: Recent Labs  Lab 07/25/23 1603 07/25/23 2107  LATICACIDVEN 1.6 1.7    Recent Results (from the past 240 hour(s))  Urine Culture     Status: Abnormal   Collection Time: 07/25/23  3:33 PM   Specimen: Urine, Random  Result Value Ref Range Status   Specimen Description URINE, RANDOM  Final   Special Requests   Final    NONE Reflexed from [id number] Performed at [facility], [address], [place], [place] [address]    Culture >=100,000 COLONIES/mL KLEBSIELLA PNEUMONIAE (A)  Final   Report Status 07/27/2023 FINAL  Final   Organism ID, Bacteria KLEBSIELLA PNEUMONIAE (A)  Final      Susceptibility   Klebsiella pneumoniae - MIC*    AMPICILLIN RESISTANT Resistant     CEFAZOLIN <=4 SENSITIVE Sensitive     CEFEPIME <=0.12 SENSITIVE Sensitive     CEFTRIAXONE <=0.25 SENSITIVE Sensitive     CIPROFLOXACIN <=0.25 SENSITIVE Sensitive     GENTAMICIN <=1 SENSITIVE Sensitive     IMIPENEM <=0.25 SENSITIVE Sensitive     NITROFURANTOIN 64 INTERMEDIATE Intermediate     TRIMETH/SULFA <=20 SENSITIVE Sensitive     AMPICILLIN/SULBACTAM <=2 SENSITIVE Sensitive     PIP/TAZO <=4 SENSITIVE Sensitive ug/mL    * >=100,000 COLONIES/mL KLEBSIELLA PNEUMONIAE  Culture, blood (Routine x 2)     Status: None (Preliminary result)   Collection Time: 07/25/23  3:44 PM   Specimen: BLOOD  Result Value Ref Range Status   Specimen Description BLOOD SITE NOT SPECIFIED  Final   Special Requests   Final    BOTTLES DRAWN AEROBIC AND ANAEROBIC Blood Culture adequate volume   Culture   Final    NO GROWTH 3 DAYS Performed at [facility], [address] [place], [place] [address]    Report Status PENDING  Incomplete  Culture, blood (Routine x 2)     Status: None (Preliminary result)   Collection Time: 07/25/23  8:59 PM   Specimen: BLOOD  Result Value Ref Range Status   Specimen Description BLOOD SITE NOT SPECIFIED  Final   Special  Requests   Final    BOTTLES DRAWN AEROBIC AND ANAEROBIC Blood Culture results may not be optimal  due to an excessive volume of blood received in culture bottles   Culture   Final    NO GROWTH 3 DAYS Performed at [facility], [address], [place], [place] [address]    Report Status PENDING  Incomplete  Surgical pcr screen     Status: Abnormal   Collection Time: 07/26/23 11:57 PM   Specimen: Nasal Mucosa; Nasal Swab  Result Value Ref Range Status   MRSA, PCR POSITIVE (A) NEGATIVE Final    Comment: RESULT CALLED TO, READ BACK BY AND VERIFIED WITH: A OPOKU,RN@0222  07/27/23 MK    Staphylococcus aureus POSITIVE (A) NEGATIVE Final    Comment: (NOTE) The Xpert SA Assay (FDA approved for NASAL specimens in patients 66 years of age and older), is one component of a comprehensive surveillance program. It is not intended to diagnose infection nor to guide or monitor treatment. Performed at [facility], [address], [place], [place] [address]          Radiology Studies: No results found.      Scheduled Meds:  vitamin C  1,000 mg Oral Daily   ezetimibe  10 mg Oral Daily   gabapentin  600 mg Oral BID   insulin aspart  0-15 Units Subcutaneous TID WC   insulin aspart  0-5 Units Subcutaneous QHS   insulin glargine-yfgn  30 Units Subcutaneous Daily   lisinopril  20 mg Oral Daily   metoprolol tartrate  25 mg Oral BID   mupirocin cream   Topical BID   nutrition supplement (JUVEN)  1 packet Oral BID BM   pantoprazole  40 mg Oral Daily   sertraline  50 mg Oral Daily   zinc sulfate  220 mg Oral Daily   Continuous Infusions:  sodium chloride     cefTRIAXone (ROCEPHIN)  IV 2 g (07/27/23 2013)   magnesium sulfate bolus IVPB     metronidazole 500 mg (07/28/23 0044)   vancomycin 1,750 mg (07/27/23 2206)          Glade Lloyd, MD [facility] 07/28/2023, 8:03 AM

## 2023-07-28 NOTE — Progress Notes (Signed)
  Inpatient Rehabilitation Admissions Coordinator   Met with patient and cousin at bedside for rehab assessment. We discussed goals and expectations of a possible CIR admit. He was previously at [facility] after initial amputation. They prefer CIR for rehab.I await OT eval and then will begin Auth with payor for possible CIR admit pending approval. Please call me with any questions.   Ottie Glazier, RN, MSN Rehab Admissions Coordinator [phone]

## 2023-07-28 NOTE — Evaluation (Signed)
Occupational Therapy Evaluation Patient Details Name: Adam Long MRN: [id number] DOB: 02-27-1976 Today's Date: 07/28/2023   History of Present Illness Pt is a 47 y.o. male admitted 10/28 due to L BKA infection/dehiscence. He underwent L AKA 10/30. PMH: CAD, HTN, hyperlipidemia, severe aortic stenosis status post mechanical AVR on Coumadin, IDDM, L BKA with last revision surgery in May 2024 due to dehiscence, history of MRSA stump infection, anxiety, depression, morbid obesity   Clinical Impression   Patient admitted for the diagnosis and procedure above.  PTA he is mobile with prosthetic leg, continues to work, drive, and remained Mod I with ADL/iADL.  Patient's deficit is post op pain to the residual leg limiting OOB activity.  Patient however, is very motivated to regain functional independence, and should progress to Mod I level prior to returning home.  OT will follow in the acute setting to address deficits, and Patient will benefit from intensive inpatient follow up therapy, >3 hours/day        If plan is discharge home, recommend the following: A lot of help with bathing/dressing/bathroom;A lot of help with walking and/or transfers;Assist for transportation;Assistance with cooking/housework    Functional Status Assessment  Patient has had a recent decline in their functional status and demonstrates the ability to make significant improvements in function in a reasonable and predictable amount of time.  Equipment Recommendations  None recommended by OT    Recommendations for Other Services       Precautions / Restrictions Precautions Precautions: Fall;Other (comment) Precaution Comments: wound vac L residual limb Restrictions Weight Bearing Restrictions: Yes LLE Weight Bearing: Non weight bearing      Mobility Bed Mobility Overal bed mobility: Needs Assistance Bed Mobility: Supine to Sit, Sit to Supine     Supine to sit: Mod assist, Used rails, HOB elevated Sit to  supine: Mod assist   General bed mobility comments: assist with trunk and RLE, increased time, heavy use of bed rail    Transfers                          Balance Overall balance assessment: Needs assistance Sitting-balance support: Feet supported, Single extremity supported Sitting balance-Leahy Scale: Fair                                     ADL either performed or assessed with clinical judgement   ADL Overall ADL's : Needs assistance/impaired Eating/Feeding: Independent;Bed level   Grooming: Wash/dry hands;Wash/dry face;Contact guard assist;Sitting   Upper Body Bathing: Contact guard assist;Sitting   Lower Body Bathing: Maximal assistance;Sitting/lateral leans;Bed level   Upper Body Dressing : Contact guard assist;Sitting   Lower Body Dressing: Moderate assistance;Maximal assistance;Sitting/lateral leans;Bed level                       Vision Baseline Vision/History: 1 Wears glasses Patient Visual Report: No change from baseline       Perception Perception: Within Functional Limits       Praxis Praxis: WFL       Pertinent Vitals/Pain Pain Assessment Pain Assessment: Faces Faces Pain Scale: Hurts even more Pain Location: L residual limb Pain Descriptors / Indicators: Spasm, Squeezing, Stabbing, Guarding, Grimacing Pain Intervention(s): Monitored during session     Extremity/Trunk Assessment Upper Extremity Assessment Upper Extremity Assessment: Overall WFL for tasks assessed   Lower Extremity Assessment Lower Extremity Assessment: Defer  to PT evaluation   Cervical / Trunk Assessment Cervical / Trunk Assessment: Normal   Communication Communication Communication: No apparent difficulties   Cognition Arousal: Alert Behavior During Therapy: WFL for tasks assessed/performed Overall Cognitive Status: Within Functional Limits for tasks assessed                                       General Comments    VSS on RA    Exercises     Shoulder Instructions      Home Living Family/patient expects to be discharged to:: Private residence Living Arrangements: Other relatives Available Help at Discharge: Family;Friend(s);Available PRN/intermittently Type of Home: House Home Access: Stairs to enter [employer] of Steps: 2 short steps Entrance Stairs-Rails: None Home Layout: Two level;Able to live on main level with bedroom/bathroom Alternate Level Stairs-Number of Steps: flight Alternate Level Stairs-Rails: Right;Left;Can reach both Bathroom Shower/Tub: Chief Strategy Officer: Handicapped height Bathroom Accessibility: Yes How Accessible: Accessible via walker Home Equipment: Rolling Walker (2 wheels);BSC/3in1;Wheelchair - manual;Tub bench          Prior Functioning/Environment Prior Level of Function : Independent/Modified Independent;Working/employed;Driving             Mobility Comments: Amb with L prosthesis and no AD. Uses RW or w/c when prosthesis not donned. ADLs Comments: mod I ADLs. Worked part time in [employer].        OT Problem List: Decreased strength;Decreased range of motion;Decreased activity tolerance;Impaired balance (sitting and/or standing);Pain      OT Treatment/Interventions: Self-care/ADL training;Therapeutic activities;Patient/family education;DME and/or AE instruction;Balance training    OT Goals(Current goals can be found in the care plan section) Acute Rehab OT Goals Patient Stated Goal: Return home OT Goal Formulation: With patient Time For Goal Achievement: 08/11/23 Potential to Achieve Goals: Good  OT Frequency: Min 1X/week    Co-evaluation              AM-PAC OT "6 Clicks" Daily Activity     Outcome Measure Help from another person eating meals?: None Help from another person taking care of personal grooming?: None Help from another person toileting, which includes using toliet, bedpan, or urinal?: A  Lot Help from another person bathing (including washing, rinsing, drying)?: A Lot Help from another person to put on and taking off regular upper body clothing?: A Little Help from another person to put on and taking off regular lower body clothing?: A Lot 6 Click Score: 17   End of Session Nurse Communication: Mobility status  Activity Tolerance: Patient tolerated treatment well Patient left: in bed;with call bell/phone within reach  OT Visit Diagnosis: Muscle weakness (generalized) (M62.81);Pain Pain - Right/Left: Left Pain - part of body: Leg                Time: 6962-9528 OT Time Calculation (min): 17 min Charges:  OT General Charges $OT Visit: 1 Visit OT Evaluation $OT Eval Moderate Complexity: 1 Mod  07/28/2023  RP, OTR/L  Acute Rehabilitation Services  Office:  [phone]   Suzanna Obey 07/28/2023, 3:37 PM

## 2023-07-29 DIAGNOSIS — I2510 Atherosclerotic heart disease of native coronary artery without angina pectoris: Secondary | ICD-10-CM

## 2023-07-29 DIAGNOSIS — M792 Neuralgia and neuritis, unspecified: Secondary | ICD-10-CM

## 2023-07-29 DIAGNOSIS — E871 Hypo-osmolality and hyponatremia: Secondary | ICD-10-CM | POA: Diagnosis not present

## 2023-07-29 DIAGNOSIS — D649 Anemia, unspecified: Secondary | ICD-10-CM

## 2023-07-29 DIAGNOSIS — Z89612 Acquired absence of left leg above knee: Secondary | ICD-10-CM

## 2023-07-29 DIAGNOSIS — Z794 Long term (current) use of insulin: Secondary | ICD-10-CM

## 2023-07-29 DIAGNOSIS — Z954 Presence of other heart-valve replacement: Secondary | ICD-10-CM

## 2023-07-29 DIAGNOSIS — I10 Essential (primary) hypertension: Secondary | ICD-10-CM | POA: Diagnosis not present

## 2023-07-29 DIAGNOSIS — T8740 Infection of amputation stump, unspecified extremity: Secondary | ICD-10-CM | POA: Diagnosis not present

## 2023-07-29 DIAGNOSIS — E1165 Type 2 diabetes mellitus with hyperglycemia: Secondary | ICD-10-CM

## 2023-07-29 LAB — CBC WITH DIFFERENTIAL/PLATELET
Abs Immature Granulocytes: 0.32 10*3/uL — ABNORMAL HIGH (ref 0.00–0.07)
Basophils Absolute: 0.1 10*3/uL (ref 0.0–0.1)
Basophils Relative: 1 %
Eosinophils Absolute: 0.2 10*3/uL (ref 0.0–0.5)
Eosinophils Relative: 2 %
HCT: 22.7 % — ABNORMAL LOW (ref 39.0–52.0)
Hemoglobin: 6.6 g/dL — CL (ref 13.0–17.0)
Immature Granulocytes: 3 %
Lymphocytes Relative: 20 %
Lymphs Abs: 2.3 10*3/uL (ref 0.7–4.0)
MCH: 21.3 pg — ABNORMAL LOW (ref 26.0–34.0)
MCHC: 29.1 g/dL — ABNORMAL LOW (ref 30.0–36.0)
MCV: 73.2 fL — ABNORMAL LOW (ref 80.0–100.0)
Monocytes Absolute: 0.8 10*3/uL (ref 0.1–1.0)
Monocytes Relative: 7 %
Neutro Abs: 8 10*3/uL — ABNORMAL HIGH (ref 1.7–7.7)
Neutrophils Relative %: 67 %
Platelets: 425 10*3/uL — ABNORMAL HIGH (ref 150–400)
RBC: 3.1 MIL/uL — ABNORMAL LOW (ref 4.22–5.81)
RDW: 18.6 % — ABNORMAL HIGH (ref 11.5–15.5)
WBC: 11.7 10*3/uL — ABNORMAL HIGH (ref 4.0–10.5)
nRBC: 0.3 % — ABNORMAL HIGH (ref 0.0–0.2)

## 2023-07-29 LAB — PROTIME-INR
INR: 1.8 — ABNORMAL HIGH (ref 0.8–1.2)
Prothrombin Time: 21.1 s — ABNORMAL HIGH (ref 11.4–15.2)

## 2023-07-29 LAB — BASIC METABOLIC PANEL
Anion gap: 7 (ref 5–15)
BUN: 15 mg/dL (ref 6–20)
CO2: 23 mmol/L (ref 22–32)
Calcium: 8 mg/dL — ABNORMAL LOW (ref 8.9–10.3)
Chloride: 103 mmol/L (ref 98–111)
Creatinine, Ser: 0.79 mg/dL (ref 0.61–1.24)
GFR, Estimated: >60 mL/min (ref >60)
Glucose, Bld: 210 mg/dL — ABNORMAL HIGH (ref 70–99)
Potassium: 4 mmol/L (ref 3.5–5.1)
Sodium: 133 mmol/L — ABNORMAL LOW (ref 135–145)

## 2023-07-29 LAB — GLUCOSE, CAPILLARY
Glucose-Capillary: 202 mg/dL — ABNORMAL HIGH (ref 70–99)
Glucose-Capillary: 266 mg/dL — ABNORMAL HIGH (ref 70–99)
Glucose-Capillary: 179 mg/dL — ABNORMAL HIGH (ref 70–99)
Glucose-Capillary: 156 mg/dL — ABNORMAL HIGH (ref 70–99)

## 2023-07-29 LAB — PREPARE RBC (CROSSMATCH)

## 2023-07-29 LAB — MAGNESIUM: Magnesium: 1.8 mg/dL (ref 1.7–2.4)

## 2023-07-29 MED ORDER — WARFARIN SODIUM 5 MG PO TABS
5.0000 mg | ORAL_TABLET | Freq: Once | ORAL | Status: AC
  Administered 2023-07-29: 5 mg via ORAL
  Filled 2023-07-29: qty 1

## 2023-07-29 MED ORDER — METHOCARBAMOL 500 MG PO TABS
500.0000 mg | ORAL_TABLET | Freq: Four times a day (QID) | ORAL | Status: DC | PRN
  Administered 2023-07-29: 500 mg via ORAL
  Filled 2023-07-29: qty 1

## 2023-07-29 MED ORDER — SODIUM CHLORIDE 0.9% IV SOLUTION: Freq: Once | INTRAVENOUS | Status: DC

## 2023-07-29 NOTE — Progress Notes (Signed)
PROGRESS NOTE    Adam Long  ZOX:[id number] DOB: [birth year] DOA: 07/25/2023 PCP: [name], DO   Brief Narrative:  47yo with h/o CAD, HTN, HLD, s/p AVR on Coumadin, morbid obesity, DM, and PAD s/p L BKA with stump dehiscence and MRSA infection presented on 10/28 with recurrent stump infection.  He was started on broad-spectrum antibiotics.  He underwent left AKA on 07/27/2023.  Assessment & Plan:   Left BKA stump infection Underwent BKA in 08/2021 and revisions in 10/2021, 11/2021, 01/2023 Now with signs of stump infection including erythema and purulent drainage X-ray showing diffuse soft tissue swelling consistent with cellulitis but no evidence of acute osteomyelitis. Negative lactate, no current concerns for sepsis Currently on broad-spectrum IV antibiotics  -underwent left AKA and wound VAC placement on 07/27/2023.  Wound and VAC care as per orthopedics recommendations.  Antibiotics discontinued on 07/28/2023. -PT/OT recommending CIR.  CIR consulted.  Currently medically stable for discharge to CIR.  Anemia of chronic disease Possibly from chronic illnesses.  Monitor intermittently.  Labs pending today.  transfuse for Hgb <7   Acute on chronic hyponatremia Sodium 128 on presentation.  Improved to 134 on 07/27/2023.  Monitor intermittently.  Labs pending today.   Poorly controlled insulin-dependent type 2 diabetes A1c 10.2. Increase dose of long-acting insulin.  Continue CBGs with SSI.  Metformin on hold.     ?UTI UA with negative nitrite, trace leukocytes, and microscopy showing >50 RBCs, >50 WBCs, and few bacteria Patient is not endorsing any urinary symptoms, vomiting, or abdominal/flank pain at present Already on antibiotics for stump site infection/cellulitis Urine culture grew Klebsiella abdomen: Unclear if this is colonizers.  Treated with 4 days of IV antibiotics and discontinued on 07/28/2023.   Microscopic hematuria CT done 06/28/2023 showing nonobstructing  left renal stones Patient denies any abdominal or flank pain at present   CAD Not endorsing any anginal symptoms.  Continue ezetimibe and metoprolol.   Hypertension Continue metoprolol and lisinopril   Hyperlipidemia Continue Zetia   Mechanical aortic valve Pharmacy following.  Coumadin resumed on 07/28/2023 after clearance from orthopedics.  Monitor INR.  Pharmacy to dose Coumadin.  Anxiety and depression Continue Zoloft   Chronic pain/neuropathy Continue gabapentin   GERD Continue Protonix   Morbid obesity -Outpatient follow-up  Hyponatremia -Mild.  No labs today  Thrombocytosis -Possibly reactive.  No labs today.   DVT prophylaxis: Coumadin  code Status: Full Family Communication: None at bedside Disposition Plan: Status is: Inpatient Remains inpatient appropriate because: Of severity of illness.  Need for CIR placement.  Currently medically stable for CIR placement.    Consultants: Orthopedics  Procedures: As above  Antimicrobials:  Anti-infectives (From admission, onward)    Start     Dose/Rate Route Frequency Ordered Stop   07/27/23 1930  ceFAZolin (ANCEF) IVPB 2g/100 mL premix  Status:  Discontinued        2 g 200 mL/hr over 30 Minutes Intravenous Every 8 hours 07/27/23 1832 07/27/23 1836   07/27/23 1700  ceFAZolin (ANCEF) IVPB 3g/100 mL premix        3 g 200 mL/hr over 30 Minutes Intravenous On call to O.R. 07/26/23 2211 07/27/23 1652   07/27/23 1637  vancomycin (VANCOCIN) powder  Status:  Discontinued          As needed 07/27/23 1638 07/27/23 1724   07/27/23 0600  ceFAZolin (ANCEF) IVPB 3g/100 mL premix  Status:  Discontinued        3 g 200 mL/hr over 30 Minutes  Intravenous On call to O.R. 07/26/23 2206 07/26/23 2211   07/26/23 2000  cefTRIAXone (ROCEPHIN) 2 g in sodium chloride 0.9 % 100 mL IVPB        2 g 200 mL/hr over 30 Minutes Intravenous Every 24 hours 07/25/23 2218 07/28/23 2146   07/26/23 1100  vancomycin (VANCOREADY) IVPB 1750 mg/350  mL        1,750 mg 175 mL/hr over 120 Minutes Intravenous Every 12 hours 07/26/23 0102 07/28/23 2359   07/26/23 1000  metroNIDAZOLE (FLAGYL) IVPB 500 mg        500 mg 100 mL/hr over 60 Minutes Intravenous Every 12 hours 07/26/23 0852 07/28/23 2355   07/25/23 2345  vancomycin (VANCOREADY) IVPB 2000 mg/400 mL        2,000 mg 200 mL/hr over 120 Minutes Intravenous  Once 07/25/23 2248 07/26/23 0120   07/25/23 2246  vancomycin (VANCOCIN) 2,500 mg in sodium chloride 0.9 % 500 mL IVPB  Status:  Discontinued        2,500 mg 262.5 mL/hr over 120 Minutes Intravenous  Once 07/25/23 2246 07/25/23 2248   07/25/23 2045  vancomycin (VANCOREADY) IVPB 1500 mg/300 mL  Status:  Discontinued        1,500 mg 150 mL/hr over 120 Minutes Intravenous  Once 07/25/23 2035 07/25/23 2246   07/25/23 2045  cefTRIAXone (ROCEPHIN) 2 g in sodium chloride 0.9 % 100 mL IVPB        2 g 200 mL/hr over 30 Minutes Intravenous  Once 07/25/23 2042 07/25/23 2146        Subjective: Patient seen and examined at bedside.  Denies shortness of breath, chest pain, fever or vomiting.  Had spasms overnight. Objective: Vitals:   07/28/23 0423 07/28/23 0806 07/28/23 1557 07/28/23 1948  BP: 120/69 112/62 119/60 126/68  Pulse: 93 93 89 93  Resp: 18 20 18 19   Temp: 98.6 F (37 C) 98.3 F (36.8 C) 98 F (36.7 C) 98.5 F (36.9 C)  TempSrc: Oral Oral Oral Oral  SpO2: 97% 97% 94% 97%  Weight:      Height:        Intake/Output Summary (Last 24 hours) at 07/29/2023 0753 Last data filed at 07/28/2023 1018 Gross per 24 hour  Intake --  Output 475 ml  Net -475 ml   Filed Weights   07/25/23 2109 07/25/23 2215  Weight: (!) 165.6 kg (!) 169.5 kg    Examination:  General: On room air.  No distress currently.  respiratory: Bilateral decreased breath sounds at bases with scattered crackles CVS: Currently rate controlled; S1-S2 heard  abdominal: Soft, morbidly obese, nontender, distended mildly, no organomegaly; normal bowel  sounds are heard  extremities: Left AKA with dressing and wound VAC present.   Data Reviewed: I have personally reviewed following labs and imaging studies  CBC: Recent Labs  Lab 07/25/23 1544 07/26/23 0643 07/27/23 0839  WBC 8.6 8.5 8.5  NEUTROABS 6.3  --  6.1  HGB 8.1* 7.5* 8.3*  HCT 28.4* 26.0* 29.3*  MCV 73.8* 73.2* 73.4*  PLT 390 365 427*   Basic Metabolic Panel: Recent Labs  Lab 07/25/23 1544 07/26/23 0643 07/26/23 2254 07/27/23 0839  NA 128* 134* 132* 134*  K 4.2 4.4 4.7 4.2  CL 98 100 100 101  CO2 22 25 23 26   GLUCOSE 293* 274* 260* 285*  BUN 14 11 12 12   CREATININE 0.85 0.88 1.12 1.01  CALCIUM 8.2* 8.6* 8.6* 8.8*   GFR: Estimated Creatinine Clearance: 142.7 mL/min (by C-G  formula based on SCr of 1.01 mg/dL). Liver Function Tests: Recent Labs  Lab 07/25/23 1544 07/26/23 2254  AST 24 33  ALT 23 25  ALKPHOS 53 49  BILITOT 0.6 0.4  PROT 7.3 6.7  ALBUMIN 2.6* 2.4*   No results for input(s): "LIPASE", "AMYLASE" in the last 168 hours. No results for input(s): "AMMONIA" in the last 168 hours. Coagulation Profile: Recent Labs  Lab 07/25/23 2251 07/26/23 0643 07/27/23 0839  INR 2.5* 2.6* 2.4*   Cardiac Enzymes: No results for input(s): "CKTOTAL", "CKMB", "CKMBINDEX", "TROPONINI" in the last 168 hours. BNP (last 3 results) No results for input(s): "PROBNP" in the last 8760 hours. HbA1C: Recent Labs    07/26/23 2254  HGBA1C 10.2*   CBG: Recent Labs  Lab 07/27/23 2113 07/28/23 0714 07/28/23 1218 07/28/23 1621 07/28/23 2121  GLUCAP 363* 264* 224* 298* 241*   Lipid Profile: No results for input(s): "CHOL", "HDL", "LDLCALC", "TRIG", "CHOLHDL", "LDLDIRECT" in the last 72 hours. Thyroid Function Tests: No results for input(s): "TSH", "T4TOTAL", "FREET4", "T3FREE", "THYROIDAB" in the last 72 hours. Anemia Panel: No results for input(s): "VITAMINB12", "FOLATE", "FERRITIN", "TIBC", "IRON", "RETICCTPCT" in the last 72 hours. Sepsis Labs: Recent  Labs  Lab 07/25/23 1603 07/25/23 2107  LATICACIDVEN 1.6 1.7    Recent Results (from the past 240 hour(s))  Urine Culture     Status: Abnormal   Collection Time: 07/25/23  3:33 PM   Specimen: Urine, Random  Result Value Ref Range Status   Specimen Description URINE, RANDOM  Final   Special Requests   Final    NONE Reflexed from [id number] Performed at [facility], [address] [place], [place] [address]    Culture >=100,000 COLONIES/mL KLEBSIELLA PNEUMONIAE (A)  Final   Report Status 07/27/2023 FINAL  Final   Organism ID, Bacteria KLEBSIELLA PNEUMONIAE (A)  Final      Susceptibility   Klebsiella pneumoniae - MIC*    AMPICILLIN RESISTANT Resistant     CEFAZOLIN <=4 SENSITIVE Sensitive     CEFEPIME <=0.12 SENSITIVE Sensitive     CEFTRIAXONE <=0.25 SENSITIVE Sensitive     CIPROFLOXACIN <=0.25 SENSITIVE Sensitive     GENTAMICIN <=1 SENSITIVE Sensitive     IMIPENEM <=0.25 SENSITIVE Sensitive     NITROFURANTOIN 64 INTERMEDIATE Intermediate     TRIMETH/SULFA <=20 SENSITIVE Sensitive     AMPICILLIN/SULBACTAM <=2 SENSITIVE Sensitive     PIP/TAZO <=4 SENSITIVE Sensitive ug/mL    * >=100,000 COLONIES/mL KLEBSIELLA PNEUMONIAE  Culture, blood (Routine x 2)     Status: None (Preliminary result)   Collection Time: 07/25/23  3:44 PM   Specimen: BLOOD  Result Value Ref Range Status   Specimen Description BLOOD SITE NOT SPECIFIED  Final   Special Requests   Final    BOTTLES DRAWN AEROBIC AND ANAEROBIC Blood Culture adequate volume   Culture   Final    NO GROWTH 3 DAYS Performed at [facility], [address] [place], [place] [address]    Report Status PENDING  Incomplete  Culture, blood (Routine x 2)     Status: None (Preliminary result)   Collection Time: 07/25/23  8:59 PM   Specimen: BLOOD  Result Value Ref Range Status   Specimen Description BLOOD SITE NOT SPECIFIED  Final   Special Requests   Final    BOTTLES DRAWN AEROBIC AND ANAEROBIC Blood Culture results may not  be optimal due to an excessive volume of blood received in culture bottles   Culture  Final    NO GROWTH 3 DAYS Performed at [facility], [address], [place], [place] [address]    Report Status PENDING  Incomplete  Surgical pcr screen     Status: Abnormal   Collection Time: 07/26/23 11:57 PM   Specimen: Nasal Mucosa; Nasal Swab  Result Value Ref Range Status   MRSA, PCR POSITIVE (A) NEGATIVE Final    Comment: RESULT CALLED TO, READ BACK BY AND VERIFIED WITH: A OPOKU,RN@0222  07/27/23 MK    Staphylococcus aureus POSITIVE (A) NEGATIVE Final    Comment: (NOTE) The Xpert SA Assay (FDA approved for NASAL specimens in patients 37 years of age and older), is one component of a comprehensive surveillance program. It is not intended to diagnose infection nor to guide or monitor treatment. Performed at [facility], [address] [place], [place] [address]          Radiology Studies: No results found.      Scheduled Meds:  vitamin C  1,000 mg Oral Daily   ezetimibe  10 mg Oral Daily   gabapentin  600 mg Oral BID   insulin aspart  0-15 Units Subcutaneous TID WC   insulin aspart  0-5 Units Subcutaneous QHS   insulin glargine-yfgn  30 Units Subcutaneous Daily   lisinopril  20 mg Oral Daily   metoprolol tartrate  25 mg Oral BID   mupirocin cream   Topical BID   nutrition supplement (JUVEN)  1 packet Oral BID BM   pantoprazole  40 mg Oral Daily   sertraline  50 mg Oral Daily   Warfarin - Pharmacist Dosing Inpatient   Does not apply q1600   zinc sulfate  220 mg Oral Daily   Continuous Infusions:  magnesium sulfate bolus IVPB            Glade Lloyd, MD [facility] 07/29/2023, 7:53 AM

## 2023-07-29 NOTE — Consult Note (Signed)
Physical Medicine and Rehabilitation Consult Reason for Consult:Rehab Referring Physician: Dr. Hanley Ben     HPI: Adam Long is a 47 y.o. male with past medical history of hypertension, hyperlipidemia, CAD, s/p AVR on Coumadin, diabetes mellitus with A1c 10.2, PAD with history of left BKA.  Patient had multiple residual revisions in the past and presented with purulent drainage and wound dehiscence of his left BKA.  Ultimately he required left AKA completed by Dr. Eual Fines on 07/27/2023 with Prevena wound VAC applied.  He is on broad-spectrum IV antibiotics.  He is being treated for acute on chronic hyponatremia.  Patient has been evaluated by physical therapy and found to be mod assist for bed mobility and mod assist for lateral scoots along edge of bed.  Hemoglobin 6.6 today, blood transfusion has been ordered.  Patient reports pain in his residual limb, overall controlled with medications.  He has had some muscle spasms in his left residual limb.  He has been seen by Occupational Therapy and he requires mod assist for lower body dressing, max assist for lower body bathing.   PTA patient left with his uncle, modified independent for mobility with his left below the knee amputation prosthetic device.  Patient lives in a two-story home but only uses the first floor.  2 steps to enter.  His uncle can assist him after discharge.   Review of Systems  Constitutional:  Positive for malaise/fatigue. Negative for chills and fever.  HENT:  Positive for tinnitus. Negative for congestion.   Eyes:  Negative for blurred vision and double vision.  Respiratory:  Negative for shortness of breath.   Cardiovascular:  Negative for chest pain.  Gastrointestinal:  Negative for abdominal pain, constipation, diarrhea, nausea and vomiting.  Genitourinary: Negative.   Musculoskeletal:  Positive for joint pain and myalgias.  Skin:  Negative for rash.  Neurological:  Positive for tingling. Negative for sensory  change and focal weakness.  Endo/Heme/Allergies:  Bruises/bleeds easily (blood thinnners).  Psychiatric/Behavioral:  Positive for depression.         Past Medical History:  Diagnosis Date   Anxiety     Aortic stenosis     Cellulitis and abscess of lower extremity 06/11/2019   Cellulitis of fourth toe of left foot     Cholelithiasis     Coronary artery disease      Nonobstructive CAD (40-50% LAD) 08/2019   Depression     Diabetes mellitus without complication (HCC)      Phreesia 09/27/2020   Elevated troponin level not due myocardial infarction 11/11/2019   Essential hypertension     Gangrene of toe of left foot (HCC) 07/06/2019   Heart murmur      Phreesia 09/27/2020   Hyperlipidemia      Phreesia 09/27/2020   Hypertension      Phreesia 09/27/2020   Mixed hyperlipidemia     Morbid obesity (HCC)     S/P aortic valve replacement with mechanical valve 12/05/2019    25 mm Carbomedics top hat bileaflet mechanical valve via partial upper hemi-sternotomy   Severe aortic stenosis 09/24/2019   Type 2 diabetes mellitus (HCC)               Past Surgical History:  Procedure Laterality Date   ABDOMINAL AORTOGRAM W/LOWER EXTREMITY N/A 07/06/2019    Procedure: ABDOMINAL AORTOGRAM W/LOWER EXTREMITY;  Surgeon: Sherren Kerns, MD;  Location: [facility] INVASIVE CV LAB;  Service: Cardiovascular;  Laterality: N/A;  Bilateral  AMPUTATION Left 07/09/2019    Procedure: LEFT FOURTH and Fifth TOE AMPUTATION.;  Surgeon: Larina Earthly, MD;  Location: [facility] OR;  Service: Vascular;  Laterality: Left;   AMPUTATION Left 03/11/2021    Procedure: LEFT FOOT 5TH  AND 4TH RAY AMPUTATION;  Surgeon: Nadara Mustard, MD;  Location: [facility] OR;  Service: Orthopedics;  Laterality: Left;   AMPUTATION Left 08/28/2021    Procedure: AMPUTATION BELOW KNEE;  Surgeon: Nadara Mustard, MD;  Location: [facility] OR;  Service: Orthopedics;  Laterality: Left;   AMPUTATION Left 01/29/2023    Procedure: LEFT AMPUTATION BELOW KNEE REVISION AND  CLOSURE;  Surgeon: Nadara Mustard, MD;  Location: [facility] OR;  Service: Orthopedics;  Laterality: Left;   AMPUTATION Left 07/27/2023    Procedure: LEFT ABOVE KNEE AMPUTATION;  Surgeon: Nadara Mustard, MD;  Location: [facility] OR;  Service: Orthopedics;  Laterality: Left;   AORTIC VALVE REPLACEMENT N/A 12/05/2019    Procedure: PARTIAL STERNOTOMY FOR AORTIC VALVE REPLACEMENT (AVR), USING CARBOMEDICS SUPRA-ANNULAR TOP HAT ;  Surgeon: Purcell Nails, MD;  Location: [facility] OR;  Service: Open Heart Surgery;  Laterality: N/A;  No neck lines on left   APPLICATION OF WOUND VAC Left 10/30/2021    Procedure: APPLICATION OF WOUND VAC;  Surgeon: Nadara Mustard, MD;  Location: [facility] OR;  Service: Orthopedics;  Laterality: Left;   APPLICATION OF WOUND VAC Left 12/25/2021    Procedure: APPLICATION OF WOUND VAC;  Surgeon: Nadara Mustard, MD;  Location: [facility] OR;  Service: Orthopedics;  Laterality: Left;   APPLICATION OF WOUND VAC Left 01/26/2023    Procedure: APPLICATION OF WOUND VAC TO LEFT STUMP;  Surgeon: Tarry Kos, MD;  Location: [facility] OR;  Service: Orthopedics;  Laterality: Left;   CARDIAC VALVE REPLACEMENT N/A      Phreesia 09/27/2020   I & D EXTREMITY Left 01/26/2023    Procedure: IRRIGATION AND DEBRIDEMENT OF LEFT BKA STUMP;  Surgeon: Tarry Kos, MD;  Location: [facility] OR;  Service: Orthopedics;  Laterality: Left;   IR RADIOLOGY PERIPHERAL GUIDED IV START   10/05/2019   IR US GUIDE VASC ACCESS RIGHT   10/05/2019   MULTIPLE EXTRACTIONS WITH ALVEOLOPLASTY N/A 10/26/2019    Procedure: EXTRACTION OF TOOTH #'S 3, 5-11,19-28,  AND 32 WITH ALVEOLOPLASTY;  Surgeon: Charlynne Pander, DDS;  Location: [facility] OR;  Service: Oral Surgery;  Laterality: N/A;   RIGHT HEART CATH AND CORONARY ANGIOGRAPHY N/A 09/24/2019    Procedure: RIGHT HEART CATH AND CORONARY ANGIOGRAPHY;  Surgeon: Tonny Bollman, MD;  Location: [facility] INVASIVE CV LAB;  Service: Cardiovascular;  Laterality: N/A;   STUMP REVISION Left 10/30/2021    Procedure: REVISION LEFT BELOW KNEE  AMPUTATION;  Surgeon: Nadara Mustard, MD;  Location: [facility] OR;  Service: Orthopedics;  Laterality: Left;   STUMP REVISION Left 12/25/2021    Procedure: REVISION LEFT BELOW KNEE AMPUTATION;  Surgeon: Nadara Mustard, MD;  Location: [facility] OR;  Service: Orthopedics;  Laterality: Left;   TEE WITHOUT CARDIOVERSION N/A 12/05/2019    Procedure: TRANSESOPHAGEAL ECHOCARDIOGRAM (TEE);  Surgeon: Purcell Nails, MD;  Location: [facility] OR;  Service: Open Heart Surgery;  Laterality: N/A;   TEE WITHOUT CARDIOVERSION N/A 09/01/2021    Procedure: TRANSESOPHAGEAL ECHOCARDIOGRAM (TEE);  Surgeon: Little Ishikawa, MD;  Location: [facility] ENDOSCOPY;  Service: Cardiovascular;  Laterality: N/A;             Family History  Problem Relation Age of Onset   Cancer Mother  Brain tumor   Heart disease Father     Hyperlipidemia Father     Hypertension Father     Stroke Father     Heart murmur Sister     Heart attack Maternal Grandmother     Liver disease Maternal Grandfather     Breast cancer Paternal Grandmother     COPD Paternal Grandfather     Heart attack Maternal Aunt     Colon cancer Maternal Uncle 67   Pancreatic cancer Paternal Aunt     Heart disease Paternal Uncle     Prostate cancer Neg Hx          Social History:  reports that he has never smoked. He quit smokeless tobacco use about 3 years ago.  His smokeless tobacco use included chew. He reports current alcohol use of about 4.0 standard drinks of alcohol per week. He reports that he does not use drugs. Allergies:  Allergies  No Known Allergies         Medications Prior to Admission  Medication Sig Dispense Refill   Continuous Glucose Sensor (FREESTYLE LIBRE 3 SENSOR) MISC Place 1 sensor on the skin every 14 days. Use to check glucose continuously 6 each 6   ezetimibe (ZETIA) 10 MG tablet Take 1 tablet (10 mg total) by mouth daily. 90 tablet 3   gabapentin (NEURONTIN) 300 MG capsule Take 2 capsules (600 mg total) by mouth 2 (two) times daily. 360  capsule 3   insulin glargine (LANTUS) 100 UNIT/ML Solostar Pen Inject 30 Units into the skin daily. 15 mL 11   lisinopril (ZESTRIL) 20 MG tablet Take 1 tablet (20 mg total) by mouth daily. 90 tablet 3   metFORMIN (GLUCOPHAGE) 500 MG tablet Take 2 tablets (1,000 mg total) by mouth 2 (two) times daily with a meal. 360 tablet 3   metoprolol tartrate (LOPRESSOR) 25 MG tablet Take 1 tablet (25 mg total) by mouth 2 (two) times daily. 180 tablet 3   pantoprazole (PROTONIX) 40 MG tablet Take 1 tablet (40 mg total) by mouth daily. 90 tablet 3   sertraline (ZOLOFT) 50 MG tablet Take 1 tablet (50 mg total) by mouth daily. 90 tablet 3   warfarin (COUMADIN) 10 MG tablet TAKE 1 TABLET BY MOUTH DAILY EXCEPT TAKE  ONE-HALF TABLET  ON MONDAYS, WEDNESDAYS AND FRIDAYS OR AS DIRECTED (Patient taking differently: Take 5-10 mg by mouth See admin instructions. TAKE 1 TABLET BY MOUTH DAILY EXCEPT TAKE  ONE-HALF TABLET  ON MONDAYS, WEDNESDAYS AND FRIDAYS OR AS DIRECTED) 30 tablet 5   Zinc Sulfate 220 (50 Zn) MG TABS Take 1 tablet (220 mg total) by mouth daily. 100 tablet 0   Insulin Pen Needle 32G X 4 MM MISC Use with Lantus flexpen 100 each 0          Home: Home Living Family/patient expects to be discharged to:: Private residence Living Arrangements: Other relatives Available Help at Discharge: Family, Friend(s), Available PRN/intermittently Type of Home: House Home Access: Stairs to enter Secretary/administrator of Steps: 2 short steps Entrance Stairs-Rails: None Home Layout: Two level, Able to live on main level with bedroom/bathroom Alternate Level Stairs-Number of Steps: flight Alternate Level Stairs-Rails: Right, Left, Can reach both Bathroom Shower/Tub: Tub/shower unit Bathroom Toilet: Handicapped height Bathroom Accessibility: Yes Home Equipment: Agricultural consultant (2 wheels), BSC/3in1, Wheelchair - manual, Tub bench Additional Comments: uncle, cousin also helps pt  Functional History: Prior  Function Prior Level of Function : Independent/Modified Independent, Working/employed, Driving Mobility Comments: Amb with  L prosthesis and no AD. Uses RW or w/c when prosthesis not donned. ADLs Comments: mod I ADLs. Worked part time in [employer]. Functional Status:  Mobility: Bed Mobility Overal bed mobility: Needs Assistance Bed Mobility: Supine to Sit, Sit to Supine Supine to sit: Mod assist, Used rails, HOB elevated Sit to supine: Mod assist General bed mobility comments: assist with trunk and RLE, increased time, heavy use of bed rail Transfers Overall transfer level: Needs assistance General transfer comment: lateral scoots along EOB mod assist. Deferred OOB due to pain.   ADL: ADL Overall ADL's : Needs assistance/impaired Eating/Feeding: Independent, Bed level Grooming: Wash/dry hands, Wash/dry face, Contact guard assist, Sitting Upper Body Bathing: Contact guard assist, Sitting Lower Body Bathing: Maximal assistance, Sitting/lateral leans, Bed level Upper Body Dressing : Contact guard assist, Sitting Lower Body Dressing: Moderate assistance, Maximal assistance, Sitting/lateral leans, Bed level   Cognition: Cognition Overall Cognitive Status: Within Functional Limits for tasks assessed Orientation Level: Oriented X4 Cognition Arousal: Alert Behavior During Therapy: WFL for tasks assessed/performed Overall Cognitive Status: Within Functional Limits for tasks assessed   Blood pressure 129/67, pulse 79, temperature 98.4 F (36.9 C), temperature source Oral, resp. rate 18, height 5\' 10"  (1.778 m), weight (!) 169.5 kg, SpO2 96%. Physical Exam     General: No apparent distress, obese HEENT: Head is normocephalic, atraumatic, PERRLA, EOMI, sclera anicteric, oral mucosa pink and moist, + edentulous, has dentures.  + Glasses Neck: Supple without JVD or lymphadenopathy Heart: Reg rate and rhythm.  Chest: CTA bilaterally without wheezes, rales, or rhonchi; no  distress Abdomen: Soft, non-tender, non-distended, bowel sounds positive. Extremities: Left AKA with wound VAC in place, serosanguineous drainage in canister Psych: Pt's affect is appropriate. Pt is cooperative Skin: Clean and intact without signs of breakdown Neuro: Alert and awake, follows commands, cranial nerves II through XII intact, able to provide coherent history, no speech or language deficits noted RUE: 5/5 Deltoid, 5/5 Biceps, 5/5 Triceps, 5/5 Wrist Ext, 5/5 Grip LUE: 5/5 Deltoid, 5/5 Biceps, 5/5 Triceps, 5/5 Wrist Ext, 5/5 Grip RLE: HF 5/5, KE 5/5, ADF 5/5, APF 5/5 LLE: Able to lift residual limb slightly to gravity Sensory exam normal for light touch and pain in all 4 limbs. No limb ataxia or cerebellar signs. No abnormal tone appreciated.    Musculoskeletal: No joint swelling or tenderness noted     Lab Results Last 24 Hours       Results for orders placed or performed during the hospital encounter of 07/25/23 (from the past 24 hour(s))  Glucose, capillary     Status: Abnormal    Collection Time: 07/28/23 12:18 PM  Result Value Ref Range    Glucose-Capillary 224 (H) 70 - 99 mg/dL  Glucose, capillary     Status: Abnormal    Collection Time: 07/28/23  4:21 PM  Result Value Ref Range    Glucose-Capillary 298 (H) 70 - 99 mg/dL  Glucose, capillary     Status: Abnormal    Collection Time: 07/28/23  9:21 PM  Result Value Ref Range    Glucose-Capillary 241 (H) 70 - 99 mg/dL  Protime-INR     Status: Abnormal    Collection Time: 07/29/23  7:40 AM  Result Value Ref Range    Prothrombin Time 21.1 (H) 11.4 - 15.2 seconds    INR 1.8 (H) 0.8 - 1.2  CBC with Differential/Platelet     Status: Abnormal    Collection Time: 07/29/23  7:40 AM  Result Value Ref Range  WBC 11.7 (H) 4.0 - 10.5 K/uL    RBC 3.10 (L) 4.22 - 5.81 MIL/uL    Hemoglobin 6.6 (LL) 13.0 - 17.0 g/dL    HCT 78.2 (L) 95.6 - 52.0 %    MCV 73.2 (L) 80.0 - 100.0 fL    MCH 21.3 (L) 26.0 - 34.0 pg    MCHC 29.1 (L)  30.0 - 36.0 g/dL    RDW 21.3 (H) 08.6 - 15.5 %    Platelets 425 (H) 150 - 400 K/uL    nRBC 0.3 (H) 0.0 - 0.2 %    Neutrophils Relative % 67 %    Neutro Abs 8.0 (H) 1.7 - 7.7 K/uL    Lymphocytes Relative 20 %    Lymphs Abs 2.3 0.7 - 4.0 K/uL    Monocytes Relative 7 %    Monocytes Absolute 0.8 0.1 - 1.0 K/uL    Eosinophils Relative 2 %    Eosinophils Absolute 0.2 0.0 - 0.5 K/uL    Basophils Relative 1 %    Basophils Absolute 0.1 0.0 - 0.1 K/uL    Immature Granulocytes 3 %    Abs Immature Granulocytes 0.32 (H) 0.00 - 0.07 K/uL  Basic metabolic panel     Status: Abnormal    Collection Time: 07/29/23  7:40 AM  Result Value Ref Range    Sodium 133 (L) 135 - 145 mmol/L    Potassium 4.0 3.5 - 5.1 mmol/L    Chloride 103 98 - 111 mmol/L    CO2 23 22 - 32 mmol/L    Glucose, Bld 210 (H) 70 - 99 mg/dL    BUN 15 6 - 20 mg/dL    Creatinine, Ser 5.78 0.61 - 1.24 mg/dL    Calcium 8.0 (L) 8.9 - 10.3 mg/dL    GFR, Estimated >46 >96 mL/min    Anion gap 7 5 - 15  Magnesium     Status: None    Collection Time: 07/29/23  7:40 AM  Result Value Ref Range    Magnesium 1.8 1.7 - 2.4 mg/dL  Glucose, capillary     Status: Abnormal    Collection Time: 07/29/23  8:34 AM  Result Value Ref Range    Glucose-Capillary 202 (H) 70 - 99 mg/dL      Imaging Results (Last 48 hours)  No results found.     Assessment/Plan: Diagnosis: Left AKA due to BKA residual limb dehiscence Does the need for close, 24 hr/day medical supervision in concert with the patient's rehab needs make it unreasonable for this patient to be served in a less intensive setting? Yes Co-Morbidities requiring supervision/potential complications:  -Acute on chronic hyponatremia -Anemia of chronic disease, ABLA -UTI -Poorly controlled diabetes mellitus type 2, on insulin -Microscopic hematuria -CAD -Hypertension -Lipidemia -Anxiety and depression -Neuropathic pain and residual limb pain -History of mechanical aortic valve on chronic  anticoagulation -Morbid obesity -Thrombocytosis/leukocytosis   Due to bladder management, bowel management, safety, skin/wound care, disease management, medication administration, pain management, and patient education, does the patient require 24 hr/day rehab nursing? Yes Does the patient require coordinated care of a physician, rehab nurse, therapy disciplines of PT/OT to address physical and functional deficits in the context of the above medical diagnosis(es)? Yes Addressing deficits in the following areas: balance, endurance, locomotion, strength, transferring, bowel/bladder control, bathing, dressing, feeding, grooming, toileting, and psychosocial support Can the patient actively participate in an intensive therapy program of at least 3 hrs of therapy per day at least 5 days per week? Yes  The potential for patient to make measurable gains while on inpatient rehab is excellent Anticipated functional outcomes upon discharge from inpatient rehab are modified independent  with PT, modified independent with OT, n/a with SLP. Estimated rehab length of stay to reach the above functional goals is: 10 to 12 days Anticipated discharge destination: Home Overall Rehab/Functional Prognosis: excellent   POST ACUTE RECOMMENDATIONS: This patient's condition is appropriate for continued rehabilitative care in the following setting: CIR Patient has agreed to participate in recommended program. Yes Note that insurance prior authorization may be required for reimbursement for recommended care.   Comment: I think patient would be a good candidate for CIR pending medical stability.       MEDICAL RECOMMENDATIONS: Consider muscle relaxer for his lower extremity muscle spasms.     I have personally performed a face to face diagnostic evaluation of this patient. Additionally, I have examined the patient's medical record including any pertinent labs and radiographic images. If the physician assistant has  documented in this note, I have reviewed and edited or otherwise concur with the physician assistant's documentation.   Thanks,   Fanny Dance, MD 07/29/2023

## 2023-07-29 NOTE — Progress Notes (Addendum)
Inpatient Rehabilitation Admissions Coordinator   Dr Natale Lay to consult today and then I will begin Auth with [place] [employer].  Ottie Glazier, RN, MSN Rehab Admissions Coordinator [phone] 07/29/2023 1:10 PM

## 2023-07-29 NOTE — PMR Pre-admission (Signed)
PMR Admission Coordinator Pre-Admission Assessment  Patient: Adam Long is an 47 y.o., male MRN: [id number] DOB: 20-Oct-1975 Height: 5\' 10"  (177.8 cm) Weight: (!) 169.5 kg              Insurance Information HMO:     PPO:      PCP:      IPA:      80/20:      OTHER:  PRIMARY: [place] [employer]      Policy#: [id number]      Subscriber: pt CM Name: Christen Butter      Phone#: [phone]     Fax#: [phone] Pre-Cert#: [id number] Received approval on 07/30/23. Pt approved for 7 days beginning 07/29/23-08/05/23. Updates due on 08/05/23.     Employer:  Benefits:  Phone #: [phone]     Name: 11/1 Eff. Date: 05/29/2023     Deduct: none      Out of Pocket Max: none      Life Max: none  CIR: per medicaid      SNF: per medicaid Outpatient: per medicaid    Co-Pay:  Home Health: per medicaid      Co-Pay:  DME: per medicaid     Co-Pay:  Providers: in network  SECONDARY: none  Financial Counselor:       Phone#:   The Data processing manager" for patients in Inpatient Rehabilitation Facilities with attached "Privacy Act Statement-Health Care Records" was provided and verbally reviewed with: N/A  Emergency Contact Information Contact Information     Name Relation Home Work Mobile   Belfry [phone]  [phone]   Carney Living [phone]  [phone]   Turner,Crystal Relative [phone]  [phone]      Other Contacts   None on File    Current Medical History  Patient Admitting Diagnosis: AKA  History of Present Illness: Adam Long is a 47 y.o. male with past medical history of hypertension, hyperlipidemia, CAD, s/p AVR on Coumadin, diabetes mellitus with A1c 10.2, PAD with history of left BKA.  Patient had multiple residual revisions in the past and presented on 07/25/23 with purulent drainage and wound dehiscence of his left BKA.   Ultimately he required left AKA completed by Dr. Eual Fines on 07/27/2023 with Prevena wound VAC applied.  He is  on broad-spectrum IV antibiotics.  He is being treated for acute on chronic hyponatremia.  Patient has been evaluated by physical therapy and found to be mod assist for bed mobility and mod assist for lateral scoots along edge of bed.  Hemoglobin 6.6 today, blood transfusion has been ordered.  Patient reports pain in his residual limb, overall controlled with medications.  He has had some muscle spasms in his left residual limb.    Patient's medical record from [facility] has been reviewed by the rehabilitation admission coordinator and physician.  Past Medical History  Past Medical History:  Diagnosis Date   Anxiety    Aortic stenosis    Cellulitis and abscess of lower extremity 06/11/2019   Cellulitis of fourth toe of left foot    Cholelithiasis    Coronary artery disease    Nonobstructive CAD (40-50% LAD) 08/2019   Depression    Diabetes mellitus without complication (HCC)    Phreesia 09/27/2020   Elevated troponin level not due myocardial infarction 11/11/2019   Essential hypertension    Gangrene of toe of left foot (HCC) 07/06/2019   Heart murmur    Phreesia 09/27/2020   Hyperlipidemia  Phreesia 09/27/2020   Hypertension    Phreesia 09/27/2020   Mixed hyperlipidemia    Morbid obesity (HCC)    S/P aortic valve replacement with mechanical valve 12/05/2019   25 mm Carbomedics top hat bileaflet mechanical valve via partial upper hemi-sternotomy   Severe aortic stenosis 09/24/2019   Type 2 diabetes mellitus (HCC)    Has the patient had major surgery during 100 days prior to admission? Yes  Family History  family history includes Breast cancer in his paternal grandmother; COPD in his paternal grandfather; Cancer in his mother; Colon cancer (age of onset: 3) in his maternal uncle; Heart attack in his maternal aunt and maternal grandmother; Heart disease in his father and paternal uncle; Heart murmur in his sister; Hyperlipidemia in his father; Hypertension in his father;  Liver disease in his maternal grandfather; Pancreatic cancer in his paternal aunt; Stroke in his father.   Current Medications   Current Facility-Administered Medications:    0.9 %  sodium chloride infusion (Manually program via Guardrails IV Fluids), , Intravenous, Once, Glade Lloyd, MD, Stopped at 07/30/23 1843   acetaminophen (TYLENOL) tablet 325-650 mg, 325-650 mg, Oral, Q6H PRN, Nadara Mustard, MD, 650 mg at 07/28/23 1332   ascorbic acid (VITAMIN C) tablet 1,000 mg, 1,000 mg, Oral, Daily, Nadara Mustard, MD, 1,000 mg at 07/31/23 0847   enoxaparin (LOVENOX) injection 150 mg, 150 mg, Subcutaneous, Q12H, Hanley Ben, Kshitiz, MD, 150 mg at 07/31/23 0902   ezetimibe (ZETIA) tablet 10 mg, 10 mg, Oral, Daily, Nadara Mustard, MD, 10 mg at 07/31/23 0846   gabapentin (NEURONTIN) capsule 600 mg, 600 mg, Oral, BID, Nadara Mustard, MD, 600 mg at 07/31/23 0846   guaiFENesin-dextromethorphan (ROBITUSSIN DM) 100-10 MG/5ML syrup 15 mL, 15 mL, Oral, Q4H PRN, Nadara Mustard, MD   hydrALAZINE (APRESOLINE) injection 5 mg, 5 mg, Intravenous, Q20 Min PRN, Nadara Mustard, MD   HYDROmorphone (DILAUDID) injection 0.5-1 mg, 0.5-1 mg, Intravenous, Q4H PRN, Nadara Mustard, MD   insulin aspart (novoLOG) injection 0-20 Units, 0-20 Units, Subcutaneous, TID WC, Alekh, Kshitiz, MD   insulin aspart (novoLOG) injection 0-5 Units, 0-5 Units, Subcutaneous, QHS, Nadara Mustard, MD, 4 Units at 07/30/23 2213   insulin glargine-yfgn (SEMGLEE) injection 35 Units, 35 Units, Subcutaneous, Daily, Alekh, Kshitiz, MD, 35 Units at 07/31/23 0902   labetalol (NORMODYNE) injection 10 mg, 10 mg, Intravenous, Q10 min PRN, Nadara Mustard, MD   lisinopril (ZESTRIL) tablet 20 mg, 20 mg, Oral, Daily, Nadara Mustard, MD, 20 mg at 07/31/23 0847   magnesium sulfate IVPB 2 g 50 mL, 2 g, Intravenous, Daily PRN, Nadara Mustard, MD   methocarbamol (ROBAXIN) tablet 500 mg, 500 mg, Oral, Q6H PRN, Hanley Ben, Kshitiz, MD, 500 mg at 07/29/23 1813   metoprolol  tartrate (LOPRESSOR) injection 2-5 mg, 2-5 mg, Intravenous, Q2H PRN, Nadara Mustard, MD   metoprolol tartrate (LOPRESSOR) tablet 25 mg, 25 mg, Oral, BID, Nadara Mustard, MD, 25 mg at 07/31/23 0847   mupirocin cream (BACTROBAN) 2 %, , Topical, BID, Nadara Mustard, MD, Given at 07/31/23 0845   nutrition supplement (JUVEN) (JUVEN) powder packet 1 packet, 1 packet, Oral, BID BM, Nadara Mustard, MD, 1 packet at 07/31/23 0853   ondansetron [facility]) injection 4 mg, 4 mg, Intravenous, Q6H PRN, Nadara Mustard, MD   oxyCODONE (Oxy IR/ROXICODONE) immediate release tablet 10-15 mg, 10-15 mg, Oral, Q4H PRN, Nadara Mustard, MD, 15 mg at 07/31/23 0247   oxyCODONE (Oxy IR/ROXICODONE) immediate  release tablet 5-10 mg, 5-10 mg, Oral, Q4H PRN, Nadara Mustard, MD, 10 mg at 07/30/23 0946   pantoprazole (PROTONIX) EC tablet 40 mg, 40 mg, Oral, Daily, Nadara Mustard, MD, 40 mg at 07/31/23 0847   phenol (CHLORASEPTIC) mouth spray 1 spray, 1 spray, Mouth/Throat, PRN, Nadara Mustard, MD   potassium chloride SA (KLOR-CON M) CR tablet 20-40 mEq, 20-40 mEq, Oral, Daily PRN, Nadara Mustard, MD   sertraline (ZOLOFT) tablet 50 mg, 50 mg, Oral, Daily, Nadara Mustard, MD, 50 mg at 07/31/23 0847   simethicone (MYLICON) chewable tablet 80 mg, 80 mg, Oral, Q6H PRN, Nadara Mustard, MD, 80 mg at 07/27/23 4098   warfarin (COUMADIN) tablet 10 mg, 10 mg, Oral, ONCE-1600, Glade Lloyd, MD   Warfarin - Pharmacist Dosing Inpatient, , Does not apply, q1600, Glade Lloyd, MD, Given at 07/30/23 1844   zinc sulfate capsule 220 mg, 220 mg, Oral, Daily, Nadara Mustard, MD, 220 mg at 07/31/23 0847  Patients Current Diet:  Diet Order             Diet Carb Modified Fluid consistency: Thin; Room service appropriate? Yes  Diet effective now                   Precautions / Restrictions Precautions Precautions: Fall, Other (comment) Precaution Comments: wound vac L residual limb Restrictions Weight Bearing Restrictions: Yes LLE Weight  Bearing: Non weight bearing   Has the patient had 2 or more falls or a fall with injury in the past year?No  Prior Activity Level Community (5-7x/wk): Mod I with prosthesis without AD, used RW or w/c when not using prosthesis  Prior Functional Level Prior Function Prior Level of Function : Independent/Modified Independent, Working/employed, Driving Mobility Comments: Amb with L prosthesis and no AD. Uses RW or w/c when prosthesis not donned. ADLs Comments: mod I ADLs. Worked part time in Agricultural consultant.  Self Care: Did the patient need help bathing, dressing, using the toilet or eating?  Independent  Indoor Mobility: Did the patient need assistance with walking from room to room (with or without device)? Independent  Stairs: Did the patient need assistance with internal or external stairs (with or without device)? Independent  Functional Cognition: Did the patient need help planning regular tasks such as shopping or remembering to take medications? Independent  Patient Information Are you of Hispanic, Latino/a,or Spanish origin?: A. No, not of Hispanic, Latino/a, or Spanish origin What is your race?: A. White Do you need or want an interpreter to communicate with a doctor or health care staff?: 0. No  Patient's Response To:  Health Literacy and Transportation Is the patient able to respond to health literacy and transportation needs?: Yes Health Literacy - How often do you need to have someone help you when you read instructions, pamphlets, or other written material from your doctor or pharmacy?: Never In the past 12 months, has lack of transportation kept you from medical appointments or from getting medications?: No In the past 12 months, has lack of transportation kept you from meetings, work, or from getting things needed for daily living?: No  Home Assistive Devices / Equipment Home Equipment: Agricultural consultant (2 wheels), BSC/3in1, Wheelchair - manual, Tub bench  Prior  Device Use: Indicate devices/aids used by the patient prior to current illness, exacerbation or injury? Manual wheelchair, Walker, and Orthotics/Prosthetics  Current Functional Level Cognition  Overall Cognitive Status: Within Functional Limits for tasks assessed Orientation Level: Oriented X4  Extremity Assessment (includes Sensation/Coordination)  Upper Extremity Assessment: Overall WFL for tasks assessed  Lower Extremity Assessment: Defer to PT evaluation LLE Deficits / Details: s/p AKA    ADLs  Overall ADL's : Needs assistance/impaired Eating/Feeding: Independent, Bed level Grooming: Wash/dry hands, Wash/dry face, Contact guard assist, Sitting Upper Body Bathing: Contact guard assist, Sitting Lower Body Bathing: Maximal assistance, Sitting/lateral leans, Bed level Upper Body Dressing : Contact guard assist, Sitting Lower Body Dressing: Moderate assistance, Maximal assistance, Sitting/lateral leans, Bed level    Mobility  Overal bed mobility: Needs Assistance Bed Mobility: Supine to Sit, Sit to Supine Supine to sit: Mod assist, Used rails, HOB elevated Sit to supine: Mod assist General bed mobility comments: assist with trunk and RLE, increased time, heavy use of bed rail    Transfers  Overall transfer level: Needs assistance General transfer comment: lateral scoots along EOB mod assist. Deferred OOB due to pain.    Ambulation / Gait / Stairs / Engineer, drilling / Balance Balance Overall balance assessment: Needs assistance Sitting-balance support: Feet supported, Single extremity supported Sitting balance-Leahy Scale: Fair    Special needs/care consideration    Previous Home Environment  Living Arrangements: Other relatives  Lives With: Other (Comment) Available Help at Discharge: Family, Friend(s), Available PRN/intermittently Type of Home: House Home Layout: Two level, Able to live on main level with bedroom/bathroom Alternate Level  Stairs-Rails: Right, Left, Can reach both Alternate Level Stairs-Number of Steps: flight Home Access: Stairs to enter Entrance Stairs-Rails: None Entrance Stairs-Number of Steps: 2 short steps Bathroom Shower/Tub: Engineer, manufacturing systems: Handicapped height Bathroom Accessibility: Yes How Accessible: Accessible via walker Home Care Services: No Additional Comments: uncle, cousin also helps pt  Discharge Living Setting Plans for Discharge Living Setting: Lives with (comment) (other relatives) Type of Home at Discharge: House Discharge Home Layout: Two level, Able to live on main level with bedroom/bathroom Alternate Level Stairs-Rails: Right, Left, Can reach both Alternate Level Stairs-Number of Steps: flight Discharge Home Access: Stairs to enter Entrance Stairs-Rails: None Entrance Stairs-Number of Steps: 2 short steps Discharge Bathroom Shower/Tub: Tub/shower unit Discharge Bathroom Toilet: Handicapped height Discharge Bathroom Accessibility: Yes How Accessible: Accessible via walker Does the patient have any problems obtaining your medications?: No  Social/Family/Support Systems Patient 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 Anticipated Caregiver: family Anticipated Industrial/product designer Information: see contacts, Uncle and cousin Ability/Limitations of Caregiver: prn only Caregiver Availability: Intermittent Discharge Plan Discussed with Primary Caregiver: Yes Is Caregiver In Agreement with Plan?: Yes Does Caregiver/Family have Issues with Lodging/Transportation while Pt is in Rehab?: No  Goals Patient/Family Goal for Rehab: Mod I with PT and OT Expected length of stay: ELOS 10 to 12 days Pt/Family Agrees to Admission and willing to participate: Yes Program Orientation Provided & Reviewed with Pt/Caregiver Including Roles  & Responsibilities: Yes  Decrease burden of Care through IP rehab admission: n/a  Possible need for SNF placement upon  discharge:not anticipated  Patient Condition: This patient's medical and functional status has remained the same since  the consult dated: 07/29/23 in which the Rehabilitation Physician determined and documented that the patient's condition is appropriate for intensive rehabilitative care in an inpatient rehabilitation facility. There are no functional changes. Patient's medical and functional status update has been discussed with the Rehabilitation physician and patient remains appropriate for inpatient rehabilitation. Will admit to inpatient rehab today.  Preadmission Screen Completed By:  Clois Dupes, RN MSN with updates by Edgardo Roys  Rubye Oaks 07/31/2023 9:37 AM ______________________________________________________________________   Discussed status with Dr. Berline Chough on 07/31/23 at 9:37 AM and received approval for admission today.  Admission Coordinator:  Clois Dupes RN MSN time 9:37 AM/Date 07/31/23

## 2023-07-29 NOTE — Progress Notes (Signed)
PHARMACY - ANTICOAGULATION CONSULT NOTE  Pharmacy Consult for Warfarin Indication: AVR replacement  No Known Allergies  Patient Measurements: Height: 5\' 10"  (177.8 cm) Weight: (!) 169.5 kg (373 lb 10.9 oz) IBW/kg (Calculated) : 73 Heparin Dosing Weight: 113.5  Vital Signs: Temp: 98.4 F (36.9 C) (11/01 0832) Temp Source: Oral (11/01 0832) BP: 129/67 (11/01 0832) Pulse Rate: 79 (11/01 0832)  Labs: Recent Labs    07/26/23 2254 07/27/23 0839 07/29/23 0740  HGB  --  8.3* 6.6*  HCT  --  29.3* 22.7*  PLT  --  427* 425*  LABPROT  --  26.2* 21.1*  INR  --  2.4* 1.8*  CREATININE 1.12 1.01 0.79    Estimated Creatinine Clearance: 180.2 mL/min (by C-G formula based on SCr of 0.79 mg/dL).  Assessment: 47 yo male admitted for L BKA stump infection. Patient is on warfarin PTA for history of mechanical AVR.  PTA warfarin regimen: 5mg  MWF, 10mg  all other days.   Warfarin resumed 10/31 after BKA, held x 2 days  INR 1.8 today, receiving blood  Goal of Therapy:  INR 2-3 Heparin level 0.3-0.7 units/ml Monitor platelets by anticoagulation protocol: Yes   Plan:  Warfarin 5 mg po x 1 dose today Daily INR  Thank you Okey Regal, PharmD  07/29/2023 10:43 AM

## 2023-07-29 NOTE — Progress Notes (Signed)
Physical Therapy Treatment Patient Details Name: Adam Long MRN: [id number] DOB: Feb 19, 1976 Today's Date: 07/29/2023   History of Present Illness Pt is a 47 y.o. male admitted 10/28 due to L BKA infection/dehiscence. He underwent L AKA 10/30. PMH: CAD, HTN, hyperlipidemia, severe aortic stenosis status post mechanical AVR on Coumadin, IDDM, L BKA with last revision surgery in May 2024 due to dehiscence, history of MRSA stump infection, anxiety, depression, morbid obesity    PT Comments  Pt reports having a bad night due to severe muscle spasms L residual limb. Also, his Hgb is 6.6 and he is scheduled to receive one unit PRBC. Pt performed BLE exercises in supine. EOB/OOB deferred this session due to pain and low hgb.      If plan is discharge home, recommend the following: Help with stairs or ramp for entrance;Assist for transportation;Assistance with cooking/housework;Two people to help with walking and/or transfers;Two people to help with bathing/dressing/bathroom   Can travel by private vehicle        Equipment Recommendations  Other (comment) (TBD)    Recommendations for Other Services       Precautions / Restrictions Precautions Precautions: Fall;Other (comment) Precaution Comments: wound vac L residual limb Restrictions LLE Weight Bearing: Non weight bearing     Mobility  Bed Mobility                    Transfers                        Ambulation/Gait                   Stairs             Wheelchair Mobility     Tilt Bed    Modified Rankin (Stroke Patients Only)       Balance                                            Cognition Arousal: Alert Behavior During Therapy: WFL for tasks assessed/performed Overall Cognitive Status: Within Functional Limits for tasks assessed                                          Exercises General Exercises - Lower Extremity Ankle Circles/Pumps:  AROM, Right, 10 reps, Supine Gluteal Sets: AROM, Both, 10 reps, Supine Heel Slides: AROM, Right, 10 reps, Supine Hip ABduction/ADduction: AROM, Right, Left, 10 reps, Supine Hip Flexion/Marching: AROM, Left, 10 reps, Supine    General Comments        Pertinent Vitals/Pain Pain Assessment Pain Assessment: 0-10 Pain Score: 7  Pain Location: L residual limb Pain Descriptors / Indicators: Spasm, Guarding, Grimacing Pain Intervention(s): Limited activity within patient's tolerance, Monitored during session, Premedicated before session    Home Living                          Prior Function            PT Goals (current goals can now be found in the care plan section) Acute Rehab PT Goals Patient Stated Goal: independence Progress towards PT goals: Progressing toward goals    Frequency    Min 1X/week  PT Plan      Co-evaluation              AM-PAC PT "6 Clicks" Mobility   Outcome Measure  Help needed turning from your back to your side while in a flat bed without using bedrails?: A Little Help needed moving from lying on your back to sitting on the side of a flat bed without using bedrails?: A Lot Help needed moving to and from a bed to a chair (including a wheelchair)?: Total Help needed standing up from a chair using your arms (e.g., wheelchair or bedside chair)?: Total Help needed to walk in hospital room?: Total Help needed climbing 3-5 steps with a railing? : Total 6 Click Score: 9    End of Session   Activity Tolerance: Patient limited by pain Patient left: in bed;with call bell/phone within reach Nurse Communication: Mobility status PT Visit Diagnosis: Other abnormalities of gait and mobility (R26.89);Pain Pain - Right/Left: Left Pain - part of body: Leg     Time: 1005-1017 PT Time Calculation (min) (ACUTE ONLY): 12 min  Charges:    $Therapeutic Exercise: 8-22 mins PT General Charges $$ ACUTE PT VISIT: 1 Visit                      Ferd Glassing., PT  Office # [phone]    Adam Long 07/29/2023, 10:55 AM

## 2023-07-29 NOTE — Progress Notes (Signed)
Physical Medicine and Rehabilitation Consult Reason for Consult:Rehab Referring Physician: Dr. Hanley Ben   HPI: Adam Long is a 47 y.o. male with past medical history of hypertension, hyperlipidemia, CAD, s/p AVR on Coumadin, diabetes mellitus with A1c 10.2, PAD with history of left BKA.  Patient had multiple residual revisions in the past and presented with purulent drainage and wound dehiscence of his left BKA.  Ultimately he required left AKA completed by Dr. Eual Fines on 07/27/2023 with Prevena wound VAC applied.  He is on broad-spectrum IV antibiotics.  He is being treated for acute on chronic hyponatremia.  Patient has been evaluated by physical therapy and found to be mod assist for bed mobility and mod assist for lateral scoots along edge of bed.  Hemoglobin 6.6 today, blood transfusion has been ordered.  Patient reports pain in his residual limb, overall controlled with medications.  He has had some muscle spasms in his left residual limb.  He has been seen by Occupational Therapy and he requires mod assist for lower body dressing, max assist for lower body bathing.  PTA patient left with his uncle, modified independent for mobility with his left below the knee amputation prosthetic device.  Patient lives in a two-story home but only uses the first floor.  2 steps to enter.  His uncle can assist him after discharge.  Review of Systems  Constitutional:  Positive for malaise/fatigue. Negative for chills and fever.  HENT:  Positive for tinnitus. Negative for congestion.   Eyes:  Negative for blurred vision and double vision.  Respiratory:  Negative for shortness of breath.   Cardiovascular:  Negative for chest pain.  Gastrointestinal:  Negative for abdominal pain, constipation, diarrhea, nausea and vomiting.  Genitourinary: Negative.   Musculoskeletal:  Positive for joint pain and myalgias.  Skin:  Negative for rash.  Neurological:  Positive for tingling. Negative for sensory change  and focal weakness.  Endo/Heme/Allergies:  Bruises/bleeds easily (blood thinnners).  Psychiatric/Behavioral:  Positive for depression.    Past Medical History:  Diagnosis Date   Anxiety    Aortic stenosis    Cellulitis and abscess of lower extremity 06/11/2019   Cellulitis of fourth toe of left foot    Cholelithiasis    Coronary artery disease    Nonobstructive CAD (40-50% LAD) 08/2019   Depression    Diabetes mellitus without complication (HCC)    Phreesia 09/27/2020   Elevated troponin level not due myocardial infarction 11/11/2019   Essential hypertension    Gangrene of toe of left foot (HCC) 07/06/2019   Heart murmur    Phreesia 09/27/2020   Hyperlipidemia    Phreesia 09/27/2020   Hypertension    Phreesia 09/27/2020   Mixed hyperlipidemia    Morbid obesity (HCC)    S/P aortic valve replacement with mechanical valve 12/05/2019   25 mm Carbomedics top hat bileaflet mechanical valve via partial upper hemi-sternotomy   Severe aortic stenosis 09/24/2019   Type 2 diabetes mellitus (HCC)    Past Surgical History:  Procedure Laterality Date   ABDOMINAL AORTOGRAM W/LOWER EXTREMITY N/A 07/06/2019   Procedure: ABDOMINAL AORTOGRAM W/LOWER EXTREMITY;  Surgeon: Sherren Kerns, MD;  Location: [facility] INVASIVE CV LAB;  Service: Cardiovascular;  Laterality: N/A;  Bilateral   AMPUTATION Left 07/09/2019   Procedure: LEFT FOURTH and Fifth TOE AMPUTATION.;  Surgeon: Larina Earthly, MD;  Location: [facility] OR;  Service: Vascular;  Laterality: Left;   AMPUTATION Left 03/11/2021   Procedure: LEFT FOOT 5TH  AND 4TH  RAY AMPUTATION;  Surgeon: Nadara Mustard, MD;  Location: [facility] OR;  Service: Orthopedics;  Laterality: Left;   AMPUTATION Left 08/28/2021   Procedure: AMPUTATION BELOW KNEE;  Surgeon: Nadara Mustard, MD;  Location: [facility] OR;  Service: Orthopedics;  Laterality: Left;   AMPUTATION Left 01/29/2023   Procedure: LEFT AMPUTATION BELOW KNEE REVISION AND CLOSURE;  Surgeon: Nadara Mustard, MD;  Location: [facility] OR;   Service: Orthopedics;  Laterality: Left;   AMPUTATION Left 07/27/2023   Procedure: LEFT ABOVE KNEE AMPUTATION;  Surgeon: Nadara Mustard, MD;  Location: [facility] OR;  Service: Orthopedics;  Laterality: Left;   AORTIC VALVE REPLACEMENT N/A 12/05/2019   Procedure: PARTIAL STERNOTOMY FOR AORTIC VALVE REPLACEMENT (AVR), USING CARBOMEDICS SUPRA-ANNULAR TOP HAT ;  Surgeon: Purcell Nails, MD;  Location: [facility] OR;  Service: Open Heart Surgery;  Laterality: N/A;  No neck lines on left   APPLICATION OF WOUND VAC Left 10/30/2021   Procedure: APPLICATION OF WOUND VAC;  Surgeon: Nadara Mustard, MD;  Location: [facility] OR;  Service: Orthopedics;  Laterality: Left;   APPLICATION OF WOUND VAC Left 12/25/2021   Procedure: APPLICATION OF WOUND VAC;  Surgeon: Nadara Mustard, MD;  Location: [facility] OR;  Service: Orthopedics;  Laterality: Left;   APPLICATION OF WOUND VAC Left 01/26/2023   Procedure: APPLICATION OF WOUND VAC TO LEFT STUMP;  Surgeon: Tarry Kos, MD;  Location: [facility] OR;  Service: Orthopedics;  Laterality: Left;   CARDIAC VALVE REPLACEMENT N/A    Phreesia 09/27/2020   I & D EXTREMITY Left 01/26/2023   Procedure: IRRIGATION AND DEBRIDEMENT OF LEFT BKA STUMP;  Surgeon: Tarry Kos, MD;  Location: [facility] OR;  Service: Orthopedics;  Laterality: Left;   IR RADIOLOGY PERIPHERAL GUIDED IV START  10/05/2019   IR US GUIDE VASC ACCESS RIGHT  10/05/2019   MULTIPLE EXTRACTIONS WITH ALVEOLOPLASTY N/A 10/26/2019   Procedure: EXTRACTION OF TOOTH #'S 3, 5-11,19-28,  AND 32 WITH ALVEOLOPLASTY;  Surgeon: Charlynne Pander, DDS;  Location: [facility] OR;  Service: Oral Surgery;  Laterality: N/A;   RIGHT HEART CATH AND CORONARY ANGIOGRAPHY N/A 09/24/2019   Procedure: RIGHT HEART CATH AND CORONARY ANGIOGRAPHY;  Surgeon: Tonny Bollman, MD;  Location: [facility] INVASIVE CV LAB;  Service: Cardiovascular;  Laterality: N/A;   STUMP REVISION Left 10/30/2021   Procedure: REVISION LEFT BELOW KNEE AMPUTATION;  Surgeon: Nadara Mustard, MD;  Location: [facility] OR;  Service:  Orthopedics;  Laterality: Left;   STUMP REVISION Left 12/25/2021   Procedure: REVISION LEFT BELOW KNEE AMPUTATION;  Surgeon: Nadara Mustard, MD;  Location: [facility] OR;  Service: Orthopedics;  Laterality: Left;   TEE WITHOUT CARDIOVERSION N/A 12/05/2019   Procedure: TRANSESOPHAGEAL ECHOCARDIOGRAM (TEE);  Surgeon: Purcell Nails, MD;  Location: [facility] OR;  Service: Open Heart Surgery;  Laterality: N/A;   TEE WITHOUT CARDIOVERSION N/A 09/01/2021   Procedure: TRANSESOPHAGEAL ECHOCARDIOGRAM (TEE);  Surgeon: Little Ishikawa, MD;  Location: [facility] ENDOSCOPY;  Service: Cardiovascular;  Laterality: N/A;   Family History  Problem Relation Age of Onset   Cancer Mother        Brain tumor   Heart disease Father    Hyperlipidemia Father    Hypertension Father    Stroke Father    Heart murmur Sister    Heart attack Maternal Grandmother    Liver disease Maternal Grandfather    Breast cancer Paternal Grandmother    COPD Paternal Grandfather    Heart attack Maternal Aunt    Colon cancer Maternal Uncle 30  Pancreatic cancer Paternal Aunt    Heart disease Paternal Uncle    Prostate cancer Neg Hx    Social History:  reports that he has never smoked. He quit smokeless tobacco use about 3 years ago.  His smokeless tobacco use included chew. He reports current alcohol use of about 4.0 standard drinks of alcohol per week. He reports that he does not use drugs. Allergies: No Known Allergies Medications Prior to Admission  Medication Sig Dispense Refill   Continuous Glucose Sensor (FREESTYLE LIBRE 3 SENSOR) MISC Place 1 sensor on the skin every 14 days. Use to check glucose continuously 6 each 6   ezetimibe (ZETIA) 10 MG tablet Take 1 tablet (10 mg total) by mouth daily. 90 tablet 3   gabapentin (NEURONTIN) 300 MG capsule Take 2 capsules (600 mg total) by mouth 2 (two) times daily. 360 capsule 3   insulin glargine (LANTUS) 100 UNIT/ML Solostar Pen Inject 30 Units into the skin daily. 15 mL 11   lisinopril  (ZESTRIL) 20 MG tablet Take 1 tablet (20 mg total) by mouth daily. 90 tablet 3   metFORMIN (GLUCOPHAGE) 500 MG tablet Take 2 tablets (1,000 mg total) by mouth 2 (two) times daily with a meal. 360 tablet 3   metoprolol tartrate (LOPRESSOR) 25 MG tablet Take 1 tablet (25 mg total) by mouth 2 (two) times daily. 180 tablet 3   pantoprazole (PROTONIX) 40 MG tablet Take 1 tablet (40 mg total) by mouth daily. 90 tablet 3   sertraline (ZOLOFT) 50 MG tablet Take 1 tablet (50 mg total) by mouth daily. 90 tablet 3   warfarin (COUMADIN) 10 MG tablet TAKE 1 TABLET BY MOUTH DAILY EXCEPT TAKE  ONE-HALF TABLET  ON MONDAYS, WEDNESDAYS AND FRIDAYS OR AS DIRECTED (Patient taking differently: Take 5-10 mg by mouth See admin instructions. TAKE 1 TABLET BY MOUTH DAILY EXCEPT TAKE  ONE-HALF TABLET  ON MONDAYS, WEDNESDAYS AND FRIDAYS OR AS DIRECTED) 30 tablet 5   Zinc Sulfate 220 (50 Zn) MG TABS Take 1 tablet (220 mg total) by mouth daily. 100 tablet 0   Insulin Pen Needle 32G X 4 MM MISC Use with Lantus flexpen 100 each 0    Home: Home Living Family/patient expects to be discharged to:: Private residence Living Arrangements: Other relatives Available Help at Discharge: Family, Friend(s), Available PRN/intermittently Type of Home: House Home Access: Stairs to enter Secretary/administrator of Steps: 2 short steps Entrance Stairs-Rails: None Home Layout: Two level, Able to live on main level with bedroom/bathroom Alternate Level Stairs-Number of Steps: flight Alternate Level Stairs-Rails: Right, Left, Can reach both Bathroom Shower/Tub: Tub/shower unit Bathroom Toilet: Handicapped height Bathroom Accessibility: Yes Home Equipment: Agricultural consultant (2 wheels), BSC/3in1, Wheelchair - manual, Tub bench Additional Comments: uncle, cousin also helps pt  Functional History: Prior Function Prior Level of Function : Independent/Modified Independent, Working/employed, Driving Mobility Comments: Amb with L prosthesis and no  AD. Uses RW or w/c when prosthesis not donned. ADLs Comments: mod I ADLs. Worked part time in [employer]. Functional Status:  Mobility: Bed Mobility Overal bed mobility: Needs Assistance Bed Mobility: Supine to Sit, Sit to Supine Supine to sit: Mod assist, Used rails, HOB elevated Sit to supine: Mod assist General bed mobility comments: assist with trunk and RLE, increased time, heavy use of bed rail Transfers Overall transfer level: Needs assistance General transfer comment: lateral scoots along EOB mod assist. Deferred OOB due to pain.      ADL: ADL Overall ADL's : Needs assistance/impaired Eating/Feeding:  Independent, Bed level Grooming: Wash/dry hands, Wash/dry face, Contact guard assist, Sitting Upper Body Bathing: Contact guard assist, Sitting Lower Body Bathing: Maximal assistance, Sitting/lateral leans, Bed level Upper Body Dressing : Contact guard assist, Sitting Lower Body Dressing: Moderate assistance, Maximal assistance, Sitting/lateral leans, Bed level  Cognition: Cognition Overall Cognitive Status: Within Functional Limits for tasks assessed Orientation Level: Oriented X4 Cognition Arousal: Alert Behavior During Therapy: WFL for tasks assessed/performed Overall Cognitive Status: Within Functional Limits for tasks assessed  Blood pressure 129/67, pulse 79, temperature 98.4 F (36.9 C), temperature source Oral, resp. rate 18, height 5\' 10"  (1.778 m), weight (!) 169.5 kg, SpO2 96%. Physical Exam   General: No apparent distress, obese HEENT: Head is normocephalic, atraumatic, PERRLA, EOMI, sclera anicteric, oral mucosa pink and moist, + edentulous, has dentures.  + Glasses Neck: Supple without JVD or lymphadenopathy Heart: Reg rate and rhythm.  Chest: CTA bilaterally without wheezes, rales, or rhonchi; no distress Abdomen: Soft, non-tender, non-distended, bowel sounds positive. Extremities: Left AKA with wound VAC in place, serosanguineous drainage in  canister Psych: Pt's affect is appropriate. Pt is cooperative Skin: Clean and intact without signs of breakdown Neuro: Alert and awake, follows commands, cranial nerves II through XII intact, able to provide coherent history, no speech or language deficits noted RUE: 5/5 Deltoid, 5/5 Biceps, 5/5 Triceps, 5/5 Wrist Ext, 5/5 Grip LUE: 5/5 Deltoid, 5/5 Biceps, 5/5 Triceps, 5/5 Wrist Ext, 5/5 Grip RLE: HF 5/5, KE 5/5, ADF 5/5, APF 5/5 LLE: Able to lift residual limb slightly to gravity Sensory exam normal for light touch and pain in all 4 limbs. No limb ataxia or cerebellar signs. No abnormal tone appreciated.   Musculoskeletal: No joint swelling or tenderness noted   Results for orders placed or performed during the hospital encounter of 07/25/23 (from the past 24 hour(s))  Glucose, capillary     Status: Abnormal   Collection Time: 07/28/23 12:18 PM  Result Value Ref Range   Glucose-Capillary 224 (H) 70 - 99 mg/dL  Glucose, capillary     Status: Abnormal   Collection Time: 07/28/23  4:21 PM  Result Value Ref Range   Glucose-Capillary 298 (H) 70 - 99 mg/dL  Glucose, capillary     Status: Abnormal   Collection Time: 07/28/23  9:21 PM  Result Value Ref Range   Glucose-Capillary 241 (H) 70 - 99 mg/dL  Protime-INR     Status: Abnormal   Collection Time: 07/29/23  7:40 AM  Result Value Ref Range   Prothrombin Time 21.1 (H) 11.4 - 15.2 seconds   INR 1.8 (H) 0.8 - 1.2  CBC with Differential/Platelet     Status: Abnormal   Collection Time: 07/29/23  7:40 AM  Result Value Ref Range   WBC 11.7 (H) 4.0 - 10.5 K/uL   RBC 3.10 (L) 4.22 - 5.81 MIL/uL   Hemoglobin 6.6 (LL) 13.0 - 17.0 g/dL   HCT 60.4 (L) 54.0 - 98.1 %   MCV 73.2 (L) 80.0 - 100.0 fL   MCH 21.3 (L) 26.0 - 34.0 pg   MCHC 29.1 (L) 30.0 - 36.0 g/dL   RDW 19.1 (H) 47.8 - 29.5 %   Platelets 425 (H) 150 - 400 K/uL   nRBC 0.3 (H) 0.0 - 0.2 %   Neutrophils Relative % 67 %   Neutro Abs 8.0 (H) 1.7 - 7.7 K/uL   Lymphocytes Relative 20  %   Lymphs Abs 2.3 0.7 - 4.0 K/uL   Monocytes Relative 7 %   Monocytes Absolute  0.8 0.1 - 1.0 K/uL   Eosinophils Relative 2 %   Eosinophils Absolute 0.2 0.0 - 0.5 K/uL   Basophils Relative 1 %   Basophils Absolute 0.1 0.0 - 0.1 K/uL   Immature Granulocytes 3 %   Abs Immature Granulocytes 0.32 (H) 0.00 - 0.07 K/uL  Basic metabolic panel     Status: Abnormal   Collection Time: 07/29/23  7:40 AM  Result Value Ref Range   Sodium 133 (L) 135 - 145 mmol/L   Potassium 4.0 3.5 - 5.1 mmol/L   Chloride 103 98 - 111 mmol/L   CO2 23 22 - 32 mmol/L   Glucose, Bld 210 (H) 70 - 99 mg/dL   BUN 15 6 - 20 mg/dL   Creatinine, Ser 6.04 0.61 - 1.24 mg/dL   Calcium 8.0 (L) 8.9 - 10.3 mg/dL   GFR, Estimated >54 >09 mL/min   Anion gap 7 5 - 15  Magnesium     Status: None   Collection Time: 07/29/23  7:40 AM  Result Value Ref Range   Magnesium 1.8 1.7 - 2.4 mg/dL  Glucose, capillary     Status: Abnormal   Collection Time: 07/29/23  8:34 AM  Result Value Ref Range   Glucose-Capillary 202 (H) 70 - 99 mg/dL   No results found.  Assessment/Plan: Diagnosis: Left AKA due to BKA residual limb dehiscence Does the need for close, 24 hr/day medical supervision in concert with the patient's rehab needs make it unreasonable for this patient to be served in a less intensive setting? Yes Co-Morbidities requiring supervision/potential complications:  -Acute on chronic hyponatremia -Anemia of chronic disease, ABLA -UTI -Poorly controlled diabetes mellitus type 2, on insulin -Microscopic hematuria -CAD -Hypertension -Lipidemia -Anxiety and depression -Neuropathic pain and residual limb pain -History of mechanical aortic valve on chronic anticoagulation -Morbid obesity -Thrombocytosis/leukocytosis  Due to bladder management, bowel management, safety, skin/wound care, disease management, medication administration, pain management, and patient education, does the patient require 24 hr/day rehab nursing?  Yes Does the patient require coordinated care of a physician, rehab nurse, therapy disciplines of PT/OT to address physical and functional deficits in the context of the above medical diagnosis(es)? Yes Addressing deficits in the following areas: balance, endurance, locomotion, strength, transferring, bowel/bladder control, bathing, dressing, feeding, grooming, toileting, and psychosocial support Can the patient actively participate in an intensive therapy program of at least 3 hrs of therapy per day at least 5 days per week? Yes The potential for patient to make measurable gains while on inpatient rehab is excellent Anticipated functional outcomes upon discharge from inpatient rehab are modified independent  with PT, modified independent with OT, n/a with SLP. Estimated rehab length of stay to reach the above functional goals is: 10 to 12 days Anticipated discharge destination: Home Overall Rehab/Functional Prognosis: excellent  POST ACUTE RECOMMENDATIONS: This patient's condition is appropriate for continued rehabilitative care in the following setting: CIR Patient has agreed to participate in recommended program. Yes Note that insurance prior authorization may be required for reimbursement for recommended care.  Comment: I think patient would be a good candidate for CIR pending medical stability.     MEDICAL RECOMMENDATIONS: Consider muscle relaxer for his lower extremity muscle spasms.   I have personally performed a face to face diagnostic evaluation of this patient. Additionally, I have examined the patient's medical record including any pertinent labs and radiographic images. If the physician assistant has documented in this note, I have reviewed and edited or otherwise concur with the physician assistant's  documentation.  Thanks,  Fanny Dance, MD 07/29/2023

## 2023-07-30 ENCOUNTER — Other Ambulatory Visit: Payer: Self-pay

## 2023-07-30 DIAGNOSIS — T8740 Infection of amputation stump, unspecified extremity: Secondary | ICD-10-CM | POA: Diagnosis not present

## 2023-07-30 LAB — MAGNESIUM: Magnesium: 1.8 mg/dL (ref 1.7–2.4)

## 2023-07-30 LAB — CBC WITH DIFFERENTIAL/PLATELET
Abs Immature Granulocytes: 0.45 10*3/uL — ABNORMAL HIGH (ref 0.00–0.07)
Basophils Absolute: 0.1 10*3/uL (ref 0.0–0.1)
Basophils Relative: 1 %
Eosinophils Absolute: 0.2 10*3/uL (ref 0.0–0.5)
Eosinophils Relative: 2 %
HCT: 24.4 % — ABNORMAL LOW (ref 39.0–52.0)
Hemoglobin: 7.1 g/dL — ABNORMAL LOW (ref 13.0–17.0)
Immature Granulocytes: 5 %
Lymphocytes Relative: 22 %
Lymphs Abs: 2.1 10*3/uL (ref 0.7–4.0)
MCH: 21.3 pg — ABNORMAL LOW (ref 26.0–34.0)
MCHC: 29.1 g/dL — ABNORMAL LOW (ref 30.0–36.0)
MCV: 73.3 fL — ABNORMAL LOW (ref 80.0–100.0)
Monocytes Absolute: 0.9 10*3/uL (ref 0.1–1.0)
Monocytes Relative: 9 %
Neutro Abs: 6.1 10*3/uL (ref 1.7–7.7)
Neutrophils Relative %: 61 %
Platelets: 430 10*3/uL — ABNORMAL HIGH (ref 150–400)
RBC: 3.33 MIL/uL — ABNORMAL LOW (ref 4.22–5.81)
RDW: 18.1 % — ABNORMAL HIGH (ref 11.5–15.5)
WBC: 9.8 10*3/uL (ref 4.0–10.5)
nRBC: 0.9 % — ABNORMAL HIGH (ref 0.0–0.2)

## 2023-07-30 LAB — BASIC METABOLIC PANEL
Anion gap: 5 (ref 5–15)
BUN: 15 mg/dL (ref 6–20)
CO2: 25 mmol/L (ref 22–32)
Calcium: 8.2 mg/dL — ABNORMAL LOW (ref 8.9–10.3)
Chloride: 101 mmol/L (ref 98–111)
Creatinine, Ser: 0.87 mg/dL (ref 0.61–1.24)
GFR, Estimated: >60 mL/min (ref >60)
Glucose, Bld: 220 mg/dL — ABNORMAL HIGH (ref 70–99)
Potassium: 4.4 mmol/L (ref 3.5–5.1)
Sodium: 131 mmol/L — ABNORMAL LOW (ref 135–145)

## 2023-07-30 LAB — CULTURE, BLOOD (ROUTINE X 2)
Culture: NO GROWTH
Culture: NO GROWTH
Special Requests: ADEQUATE

## 2023-07-30 LAB — GLUCOSE, CAPILLARY
Glucose-Capillary: 198 mg/dL — ABNORMAL HIGH (ref 70–99)
Glucose-Capillary: 237 mg/dL — ABNORMAL HIGH (ref 70–99)
Glucose-Capillary: 238 mg/dL — ABNORMAL HIGH (ref 70–99)
Glucose-Capillary: 318 mg/dL — ABNORMAL HIGH (ref 70–99)

## 2023-07-30 LAB — TYPE AND SCREEN
ABO/RH(D): A POS
Antibody Screen: NEGATIVE
Unit division: 0

## 2023-07-30 LAB — PROTIME-INR
INR: 1.6 — ABNORMAL HIGH (ref 0.8–1.2)
Prothrombin Time: 19.1 s — ABNORMAL HIGH (ref 11.4–15.2)

## 2023-07-30 LAB — BPAM RBC
Blood Product Expiration Date: 202411232359
ISSUE DATE / TIME: 202411011503
Unit Type and Rh: 6200

## 2023-07-30 MED ORDER — INFLUENZA VIRUS VACC SPLIT PF (FLUZONE) 0.5 ML IM SUSY
0.5000 mL | PREFILLED_SYRINGE | INTRAMUSCULAR | Status: AC
  Administered 2023-07-31: 0.5 mL via INTRAMUSCULAR
  Filled 2023-07-30: qty 0.5

## 2023-07-30 MED ORDER — WARFARIN SODIUM 5 MG PO TABS
5.0000 mg | ORAL_TABLET | Freq: Once | ORAL | Status: AC
  Administered 2023-07-30: 5 mg via ORAL
  Filled 2023-07-30: qty 1

## 2023-07-30 NOTE — Plan of Care (Signed)
  Problem: Education: Goal: Knowledge of General Education information will improve Description: Including pain rating scale, medication(s)/side effects and non-pharmacologic comfort measures Outcome: Progressing   Problem: Pain Management: Goal: General experience of comfort will improve Outcome: Progressing

## 2023-07-30 NOTE — Plan of Care (Signed)
  Problem: Education: Goal: Knowledge of General Education information will improve Description Including pain rating scale, medication(s)/side effects and non-pharmacologic comfort measures Outcome: Progressing   

## 2023-07-30 NOTE — Progress Notes (Signed)
Inpatient Rehab Admissions Coordinator:  Insurance authorization received. Await bed availability. Will continue to follow.   Wolfgang Phoenix, MS, CCC-SLP Admissions Coordinator [phone]

## 2023-07-30 NOTE — Progress Notes (Signed)
cannister marker at approx 45ccs on initial assessment

## 2023-07-30 NOTE — Progress Notes (Signed)
PHARMACY - ANTICOAGULATION CONSULT NOTE  Pharmacy Consult for Warfarin Indication: AVR replacement  No Known Allergies  Patient Measurements: Height: 5\' 10"  (177.8 cm) Weight: (!) 169.5 kg (373 lb 10.9 oz) IBW/kg (Calculated) : 73 Heparin Dosing Weight: 113.5  Vital Signs: Temp: 97.9 F (36.6 C) (11/02 0558) Temp Source: Oral (11/02 0558) BP: 127/66 (11/02 0558) Pulse Rate: 78 (11/02 0558)  Labs: Recent Labs    07/27/23 0839 07/29/23 0740 07/30/23 0328  HGB 8.3* 6.6* 7.1*  HCT 29.3* 22.7* 24.4*  PLT 427* 425* 430*  LABPROT 26.2* 21.1* 19.1*  INR 2.4* 1.8* 1.6*  CREATININE 1.01 0.79 0.87    Estimated Creatinine Clearance: 165.7 mL/min (by C-G formula based on SCr of 0.87 mg/dL).  Assessment: 47 yo male admitted for L BKA stump infection. Patient is on warfarin PTA for history of mechanical AVR.  PTA warfarin regimen: 5mg  MWF, 10mg  all other days.   Warfarin resumed 10/31 after BKA, held x 2 days  INR 1.6 is within goal range today. Plt 430. Hgb 7.1 improved after receiving PRBC x1 yesterday.   Goal of Therapy:  INR 2-3 Heparin level 0.3-0.7 units/ml Monitor platelets by anticoagulation protocol: Yes   Plan:  Warfarin 5 mg po x 1 dose today Monitor daily INR and CBC Continue to monitor H&H    Jerry Caras, PharmD PGY2 Oncology Pharmacy Resident   07/30/2023 7:58 AM

## 2023-07-30 NOTE — Plan of Care (Signed)
  Problem: Education: Goal: Knowledge of General Education information will improve Description: Including pain rating scale, medication(s)/side effects and non-pharmacologic comfort measures Outcome: Not Progressing   Problem: Health Behavior/Discharge Planning: Goal: Ability to manage health-related needs will improve Outcome: Not Progressing   Problem: Clinical Measurements: Goal: Ability to maintain clinical measurements within normal limits will improve Outcome: Not Progressing Goal: Will remain free from infection Outcome: Not Progressing Goal: Diagnostic test results will improve Outcome: Not Progressing Goal: Respiratory complications will improve Outcome: Not Progressing Goal: Cardiovascular complication will be avoided Outcome: Not Progressing   Problem: Activity: Goal: Risk for activity intolerance will decrease Outcome: Not Progressing   Problem: Nutrition: Goal: Adequate nutrition will be maintained Outcome: Not Progressing   Problem: Coping: Goal: Level of anxiety will decrease Outcome: Not Progressing   Problem: Elimination: Goal: Will not experience complications related to bowel motility Outcome: Not Progressing Goal: Will not experience complications related to urinary retention Outcome: Not Progressing   Problem: Pain Management: Goal: General experience of comfort will improve Outcome: Not Progressing   Problem: Safety: Goal: Ability to remain free from injury will improve Outcome: Not Progressing   Problem: Skin Integrity: Goal: Risk for impaired skin integrity will decrease Outcome: Not Progressing   Problem: Clinical Measurements: Goal: Ability to avoid or minimize complications of infection will improve Outcome: Not Progressing   Problem: Skin Integrity: Goal: Skin integrity will improve Outcome: Not Progressing   Problem: Education: Goal: Ability to describe self-care measures that may prevent or decrease complications (Diabetes  Survival Skills Education) will improve Outcome: Not Progressing Goal: Individualized Educational Video(s) Outcome: Not Progressing   Problem: Coping: Goal: Ability to adjust to condition or change in health will improve Outcome: Not Progressing   Problem: Fluid Volume: Goal: Ability to maintain a balanced intake and output will improve Outcome: Not Progressing   Problem: Health Behavior/Discharge Planning: Goal: Ability to identify and utilize available resources and services will improve Outcome: Not Progressing Goal: Ability to manage health-related needs will improve Outcome: Not Progressing   Problem: Metabolic: Goal: Ability to maintain appropriate glucose levels will improve Outcome: Not Progressing   Problem: Nutritional: Goal: Maintenance of adequate nutrition will improve Outcome: Not Progressing Goal: Progress toward achieving an optimal weight will improve Outcome: Not Progressing   Problem: Skin Integrity: Goal: Risk for impaired skin integrity will decrease Outcome: Not Progressing   Problem: Tissue Perfusion: Goal: Adequacy of tissue perfusion will improve Outcome: Not Progressing   Problem: Education: Goal: Knowledge of the prescribed therapeutic regimen will improve Outcome: Not Progressing Goal: Ability to verbalize activity precautions or restrictions will improve Outcome: Not Progressing Goal: Understanding of discharge needs will improve Outcome: Not Progressing   Problem: Activity: Goal: Ability to perform//tolerate increased activity and mobilize with assistive devices will improve Outcome: Not Progressing   Problem: Clinical Measurements: Goal: Postoperative complications will be avoided or minimized Outcome: Not Progressing   Problem: Self-Care: Goal: Ability to meet self-care needs will improve Outcome: Not Progressing   Problem: Self-Concept: Goal: Ability to maintain and perform role responsibilities to the fullest extent possible  will improve Outcome: Not Progressing   Problem: Pain Management: Goal: Pain level will decrease with appropriate interventions Outcome: Not Progressing   Problem: Skin Integrity: Goal: Demonstration of wound healing without infection will improve Outcome: Not Progressing

## 2023-07-30 NOTE — Progress Notes (Signed)
PROGRESS NOTE    Adam Long  VHQ:[id number] DOB: 1975/10/11 DOA: 07/25/2023 PCP: [name], DO   Brief Narrative:  47yo with h/o CAD, HTN, HLD, s/p AVR on Coumadin, morbid obesity, DM, and PAD s/p L BKA with stump dehiscence and MRSA infection presented on 10/28 with recurrent stump infection.  He was started on broad-spectrum antibiotics.  He underwent left AKA on 07/27/2023.  Assessment & Plan:   Left BKA stump infection -underwent left AKA and wound VAC placement on 07/27/2023.  Wound and VAC care as per orthopedics recommendations.  Antibiotics discontinued on 07/28/2023. -PT/OT recommending CIR.  CIR following.  Currently medically stable for discharge to CIR.  Acute on chronic anemia of chronic disease Possibly from chronic illnesses.  Hemoglobin 6.6 on 07/29/2023.  Transfused 1 unit of packed red cells.  Hemoglobin 7.1 today.  Transfuse if hemoglobin is less than 7.  Monitor H&H.    Acute on chronic hyponatremia Sodium 128 on presentation.  Sodium 131 today.  Monitor intermittently.  Encourage oral intake.  Poorly controlled insulin-dependent type 2 diabetes A1c 10.2. Continue long-acting insulin.  Continue CBGs with SSI.  Metformin on hold.     ?UTI UA with negative nitrite, trace leukocytes, and microscopy showing >50 RBCs, >50 WBCs, and few bacteria Patient is not endorsing any urinary symptoms, vomiting, or abdominal/flank pain at present Already on antibiotics for stump site infection/cellulitis Urine culture grew Klebsiella abdomen: Unclear if this is colonizers.  Treated with 4 days of IV antibiotics and discontinued on 07/28/2023.   Microscopic hematuria CT done 06/28/2023 showing nonobstructing left renal stones Patient denies any abdominal or flank pain at present   CAD Not endorsing any anginal symptoms.  Continue ezetimibe and metoprolol.   Hypertension Continue metoprolol and lisinopril   Hyperlipidemia Continue Zetia   Mechanical aortic  valve Pharmacy following.  Coumadin resumed on 07/28/2023 after clearance from orthopedics.  Monitor INR.  Pharmacy to dose Coumadin.  Anxiety and depression Continue Zoloft   Chronic pain/neuropathy Continue gabapentin   GERD Continue Protonix   Morbid obesity -Outpatient follow-up  Thrombocytosis -Resolved  DVT prophylaxis: Coumadin  code Status: Full Family Communication: None at bedside Disposition Plan: Status is: Inpatient Remains inpatient appropriate because: Of severity of illness.  Need for CIR placement.  Currently medically stable for CIR placement.    Consultants: Orthopedics  Procedures: As above  Antimicrobials:  Anti-infectives (From admission, onward)    Start     Dose/Rate Route Frequency Ordered Stop   07/27/23 1930  ceFAZolin (ANCEF) IVPB 2g/100 mL premix  Status:  Discontinued        2 g 200 mL/hr over 30 Minutes Intravenous Every 8 hours 07/27/23 1832 07/27/23 1836   07/27/23 1700  ceFAZolin (ANCEF) IVPB 3g/100 mL premix        3 g 200 mL/hr over 30 Minutes Intravenous On call to O.R. 07/26/23 2211 07/27/23 1652   07/27/23 1637  vancomycin (VANCOCIN) powder  Status:  Discontinued          As needed 07/27/23 1638 07/27/23 1724   07/27/23 0600  ceFAZolin (ANCEF) IVPB 3g/100 mL premix  Status:  Discontinued        3 g 200 mL/hr over 30 Minutes Intravenous On call to O.R. 07/26/23 2206 07/26/23 2211   07/26/23 2000  cefTRIAXone (ROCEPHIN) 2 g in sodium chloride 0.9 % 100 mL IVPB        2 g 200 mL/hr over 30 Minutes Intravenous Every 24 hours 07/25/23 2218 07/28/23  2146   07/26/23 1100  vancomycin (VANCOREADY) IVPB 1750 mg/350 mL        1,750 mg 175 mL/hr over 120 Minutes Intravenous Every 12 hours 07/26/23 0102 07/28/23 2359   07/26/23 1000  metroNIDAZOLE (FLAGYL) IVPB 500 mg        500 mg 100 mL/hr over 60 Minutes Intravenous Every 12 hours 07/26/23 0852 07/29/23 1900   07/25/23 2345  vancomycin (VANCOREADY) IVPB 2000 mg/400 mL        2,000  mg 200 mL/hr over 120 Minutes Intravenous  Once 07/25/23 2248 07/26/23 0120   07/25/23 2246  vancomycin (VANCOCIN) 2,500 mg in sodium chloride 0.9 % 500 mL IVPB  Status:  Discontinued        2,500 mg 262.5 mL/hr over 120 Minutes Intravenous  Once 07/25/23 2246 07/25/23 2248   07/25/23 2045  vancomycin (VANCOREADY) IVPB 1500 mg/300 mL  Status:  Discontinued        1,500 mg 150 mL/hr over 120 Minutes Intravenous  Once 07/25/23 2035 07/25/23 2246   07/25/23 2045  cefTRIAXone (ROCEPHIN) 2 g in sodium chloride 0.9 % 100 mL IVPB        2 g 200 mL/hr over 30 Minutes Intravenous  Once 07/25/23 2042 07/25/23 2146        Subjective: Patient seen and examined at bedside.  No fever, vomiting, worsening shortness of breath reported.   Objective: Vitals:   07/29/23 1815 07/29/23 1922 07/29/23 2142 07/30/23 0558  BP:  121/73 107/62 127/66  Pulse: 88 89 84 78  Resp: 20     Temp: 98.3 F (36.8 C) 98.4 F (36.9 C)  97.9 F (36.6 C)  TempSrc:  Oral  Oral  SpO2: 99% 98%  98%  Weight:      Height:        Intake/Output Summary (Last 24 hours) at 07/30/2023 0800 Last data filed at 07/30/2023 0601 Gross per 24 hour  Intake 476 ml  Output 2850 ml  Net -2374 ml   Filed Weights   07/25/23 2109 07/25/23 2215  Weight: (!) 165.6 kg (!) 169.5 kg    Examination:  General: Currently on room air.  No distress. respiratory: Decreased breath sounds at bases bilaterally with some crackles  CVS: Rate currently controlled abdominal: Soft, morbidly obese, nontender, slightly distended; no organomegaly; bowel sounds heard extremities: Left AKA present with dressing and wound VAC  Data Reviewed: I have personally reviewed following labs and imaging studies  CBC: Recent Labs  Lab 07/25/23 1544 07/26/23 0643 07/27/23 0839 07/29/23 0740 07/30/23 0328  WBC 8.6 8.5 8.5 11.7* 9.8  NEUTROABS 6.3  --  6.1 8.0* 6.1  HGB 8.1* 7.5* 8.3* 6.6* 7.1*  HCT 28.4* 26.0* 29.3* 22.7* 24.4*  MCV 73.8* 73.2* 73.4*  73.2* 73.3*  PLT 390 365 427* 425* 430*   Basic Metabolic Panel: Recent Labs  Lab 07/26/23 0643 07/26/23 2254 07/27/23 0839 07/29/23 0740 07/30/23 0328  NA 134* 132* 134* 133* 131*  K 4.4 4.7 4.2 4.0 4.4  CL 100 100 101 103 101  CO2 25 23 26 23 25   GLUCOSE 274* 260* 285* 210* 220*  BUN 11 12 12 15 15   CREATININE 0.88 1.12 1.01 0.79 0.87  CALCIUM 8.6* 8.6* 8.8* 8.0* 8.2*  MG  --   --   --  1.8 1.8   GFR: Estimated Creatinine Clearance: 165.7 mL/min (by C-G formula based on SCr of 0.87 mg/dL). Liver Function Tests: Recent Labs  Lab 07/25/23 1544 07/26/23 2254  AST  24 33  ALT 23 25  ALKPHOS 53 49  BILITOT 0.6 0.4  PROT 7.3 6.7  ALBUMIN 2.6* 2.4*   No results for input(s): "LIPASE", "AMYLASE" in the last 168 hours. No results for input(s): "AMMONIA" in the last 168 hours. Coagulation Profile: Recent Labs  Lab 07/25/23 2251 07/26/23 0643 07/27/23 0839 07/29/23 0740 07/30/23 0328  INR 2.5* 2.6* 2.4* 1.8* 1.6*   Cardiac Enzymes: No results for input(s): "CKTOTAL", "CKMB", "CKMBINDEX", "TROPONINI" in the last 168 hours. BNP (last 3 results) No results for input(s): "PROBNP" in the last 8760 hours. HbA1C: No results for input(s): "HGBA1C" in the last 72 hours.  CBG: Recent Labs  Lab 07/28/23 2121 07/29/23 0834 07/29/23 1155 07/29/23 1639 07/29/23 2141  GLUCAP 241* 202* 266* 179* 156*   Lipid Profile: No results for input(s): "CHOL", "HDL", "LDLCALC", "TRIG", "CHOLHDL", "LDLDIRECT" in the last 72 hours. Thyroid Function Tests: No results for input(s): "TSH", "T4TOTAL", "FREET4", "T3FREE", "THYROIDAB" in the last 72 hours. Anemia Panel: No results for input(s): "VITAMINB12", "FOLATE", "FERRITIN", "TIBC", "IRON", "RETICCTPCT" in the last 72 hours. Sepsis Labs: Recent Labs  Lab 07/25/23 1603 07/25/23 2107  LATICACIDVEN 1.6 1.7    Recent Results (from the past 240 hour(s))  Urine Culture     Status: Abnormal   Collection Time: 07/25/23  3:33 PM    Specimen: Urine, Random  Result Value Ref Range Status   Specimen Description URINE, RANDOM  Final   Special Requests   Final    NONE Reflexed from [id number] Performed at [facility], [address], [place], [place] [address]    Culture >=100,000 COLONIES/mL KLEBSIELLA PNEUMONIAE (A)  Final   Report Status 07/27/2023 FINAL  Final   Organism ID, Bacteria KLEBSIELLA PNEUMONIAE (A)  Final      Susceptibility   Klebsiella pneumoniae - MIC*    AMPICILLIN RESISTANT Resistant     CEFAZOLIN <=4 SENSITIVE Sensitive     CEFEPIME <=0.12 SENSITIVE Sensitive     CEFTRIAXONE <=0.25 SENSITIVE Sensitive     CIPROFLOXACIN <=0.25 SENSITIVE Sensitive     GENTAMICIN <=1 SENSITIVE Sensitive     IMIPENEM <=0.25 SENSITIVE Sensitive     NITROFURANTOIN 64 INTERMEDIATE Intermediate     TRIMETH/SULFA <=20 SENSITIVE Sensitive     AMPICILLIN/SULBACTAM <=2 SENSITIVE Sensitive     PIP/TAZO <=4 SENSITIVE Sensitive ug/mL    * >=100,000 COLONIES/mL KLEBSIELLA PNEUMONIAE  Culture, blood (Routine x 2)     Status: None (Preliminary result)   Collection Time: 07/25/23  3:44 PM   Specimen: BLOOD  Result Value Ref Range Status   Specimen Description BLOOD SITE NOT SPECIFIED  Final   Special Requests   Final    BOTTLES DRAWN AEROBIC AND ANAEROBIC Blood Culture adequate volume   Culture   Final    NO GROWTH 4 DAYS Performed at [facility], [address] [place], [place] [address]    Report Status PENDING  Incomplete  Culture, blood (Routine x 2)     Status: None (Preliminary result)   Collection Time: 07/25/23  8:59 PM   Specimen: BLOOD  Result Value Ref Range Status   Specimen Description BLOOD SITE NOT SPECIFIED  Final   Special Requests   Final    BOTTLES DRAWN AEROBIC AND ANAEROBIC Blood Culture results may not be optimal due to an excessive volume of blood received in culture bottles   Culture   Final    NO GROWTH 4 DAYS Performed at [facility], [address] [place], [place]  [address]    Report Status PENDING  Incomplete  Surgical pcr screen     Status: Abnormal   Collection Time: 07/26/23 11:57 PM   Specimen: Nasal Mucosa; Nasal Swab  Result Value Ref Range Status   MRSA, PCR POSITIVE (A) NEGATIVE Final    Comment: RESULT CALLED TO, READ BACK BY AND VERIFIED WITH: A OPOKU,RN@0222  07/27/23 MK    Staphylococcus aureus POSITIVE (A) NEGATIVE Final    Comment: (NOTE) The Xpert SA Assay (FDA approved for NASAL specimens in patients 2 years of age and older), is one component of a comprehensive surveillance program. It is not intended to diagnose infection nor to guide or monitor treatment. Performed at [facility], [address], [place], [place] [address]          Radiology Studies: No results found.      Scheduled Meds:  sodium chloride   Intravenous Once   vitamin C  1,000 mg Oral Daily   ezetimibe  10 mg Oral Daily   gabapentin  600 mg Oral BID   insulin aspart  0-15 Units Subcutaneous TID WC   insulin aspart  0-5 Units Subcutaneous QHS   insulin glargine-yfgn  30 Units Subcutaneous Daily   lisinopril  20 mg Oral Daily   metoprolol tartrate  25 mg Oral BID   mupirocin cream   Topical BID   nutrition supplement (JUVEN)  1 packet Oral BID BM   pantoprazole  40 mg Oral Daily   sertraline  50 mg Oral Daily   Warfarin - Pharmacist Dosing Inpatient   Does not apply q1600   zinc sulfate  220 mg Oral Daily   Continuous Infusions:  magnesium sulfate bolus IVPB            Glade Lloyd, MD [facility] 07/30/2023, 8:00 AM

## 2023-07-31 ENCOUNTER — Inpatient Hospital Stay (HOSPITAL_COMMUNITY)
Admission: AD | Admit: 2023-07-31 | Discharge: 2023-08-17 | DRG: 560 | Disposition: A | Discharge status: Home / Self Care | Payer: Medicaid Other | Source: Ambulatory Visit | Attending: Physical Medicine & Rehabilitation | Admitting: Physical Medicine & Rehabilitation

## 2023-07-31 ENCOUNTER — Encounter (HOSPITAL_COMMUNITY): Payer: Self-pay | Admitting: Physical Medicine and Rehabilitation

## 2023-07-31 DIAGNOSIS — Z6841 Body Mass Index (BMI) 40.0 and over, adult: Secondary | ICD-10-CM | POA: Diagnosis not present

## 2023-07-31 DIAGNOSIS — S78112A Complete traumatic amputation at level between left hip and knee, initial encounter: Secondary | ICD-10-CM | POA: Diagnosis not present

## 2023-07-31 DIAGNOSIS — T8744 Infection of amputation stump, left lower extremity: Secondary | ICD-10-CM | POA: Diagnosis present

## 2023-07-31 DIAGNOSIS — Z87442 Personal history of urinary calculi: Secondary | ICD-10-CM

## 2023-07-31 DIAGNOSIS — E1142 Type 2 diabetes mellitus with diabetic polyneuropathy: Secondary | ICD-10-CM | POA: Diagnosis present

## 2023-07-31 DIAGNOSIS — T8740 Infection of amputation stump, unspecified extremity: Secondary | ICD-10-CM | POA: Diagnosis not present

## 2023-07-31 DIAGNOSIS — F419 Anxiety disorder, unspecified: Secondary | ICD-10-CM | POA: Diagnosis present

## 2023-07-31 DIAGNOSIS — D649 Anemia, unspecified: Secondary | ICD-10-CM | POA: Diagnosis present

## 2023-07-31 DIAGNOSIS — I10 Essential (primary) hypertension: Secondary | ICD-10-CM | POA: Diagnosis present

## 2023-07-31 DIAGNOSIS — R3129 Other microscopic hematuria: Secondary | ICD-10-CM | POA: Diagnosis not present

## 2023-07-31 DIAGNOSIS — I2510 Atherosclerotic heart disease of native coronary artery without angina pectoris: Secondary | ICD-10-CM | POA: Diagnosis present

## 2023-07-31 DIAGNOSIS — Z83438 Family history of other disorder of lipoprotein metabolism and other lipidemia: Secondary | ICD-10-CM

## 2023-07-31 DIAGNOSIS — K219 Gastro-esophageal reflux disease without esophagitis: Secondary | ICD-10-CM | POA: Diagnosis present

## 2023-07-31 DIAGNOSIS — R7989 Other specified abnormal findings of blood chemistry: Secondary | ICD-10-CM | POA: Diagnosis not present

## 2023-07-31 DIAGNOSIS — Z89612 Acquired absence of left leg above knee: Secondary | ICD-10-CM

## 2023-07-31 DIAGNOSIS — G8929 Other chronic pain: Secondary | ICD-10-CM | POA: Diagnosis present

## 2023-07-31 DIAGNOSIS — Z7901 Long term (current) use of anticoagulants: Secondary | ICD-10-CM

## 2023-07-31 DIAGNOSIS — N200 Calculus of kidney: Secondary | ICD-10-CM | POA: Diagnosis present

## 2023-07-31 DIAGNOSIS — Z8249 Family history of ischemic heart disease and other diseases of the circulatory system: Secondary | ICD-10-CM

## 2023-07-31 DIAGNOSIS — Z825 Family history of asthma and other chronic lower respiratory diseases: Secondary | ICD-10-CM

## 2023-07-31 DIAGNOSIS — L03818 Cellulitis of other sites: Secondary | ICD-10-CM | POA: Diagnosis present

## 2023-07-31 DIAGNOSIS — Z79899 Other long term (current) drug therapy: Secondary | ICD-10-CM | POA: Diagnosis not present

## 2023-07-31 DIAGNOSIS — I878 Other specified disorders of veins: Secondary | ICD-10-CM | POA: Diagnosis present

## 2023-07-31 DIAGNOSIS — Z803 Family history of malignant neoplasm of breast: Secondary | ICD-10-CM

## 2023-07-31 DIAGNOSIS — Y835 Amputation of limb(s) as the cause of abnormal reaction of the patient, or of later complication, without mention of misadventure at the time of the procedure: Secondary | ICD-10-CM | POA: Diagnosis present

## 2023-07-31 DIAGNOSIS — Z4781 Encounter for orthopedic aftercare following surgical amputation: Secondary | ICD-10-CM | POA: Diagnosis present

## 2023-07-31 DIAGNOSIS — D75839 Thrombocytosis, unspecified: Secondary | ICD-10-CM | POA: Diagnosis present

## 2023-07-31 DIAGNOSIS — F32A Depression, unspecified: Secondary | ICD-10-CM | POA: Diagnosis present

## 2023-07-31 DIAGNOSIS — E782 Mixed hyperlipidemia: Secondary | ICD-10-CM | POA: Diagnosis present

## 2023-07-31 DIAGNOSIS — E1151 Type 2 diabetes mellitus with diabetic peripheral angiopathy without gangrene: Secondary | ICD-10-CM | POA: Diagnosis present

## 2023-07-31 DIAGNOSIS — G546 Phantom limb syndrome with pain: Secondary | ICD-10-CM | POA: Diagnosis present

## 2023-07-31 DIAGNOSIS — Z8614 Personal history of Methicillin resistant Staphylococcus aureus infection: Secondary | ICD-10-CM

## 2023-07-31 DIAGNOSIS — Z823 Family history of stroke: Secondary | ICD-10-CM

## 2023-07-31 DIAGNOSIS — Z87891 Personal history of nicotine dependence: Secondary | ICD-10-CM

## 2023-07-31 DIAGNOSIS — K5900 Constipation, unspecified: Secondary | ICD-10-CM | POA: Diagnosis not present

## 2023-07-31 DIAGNOSIS — Z952 Presence of prosthetic heart valve: Secondary | ICD-10-CM

## 2023-07-31 DIAGNOSIS — Z800 Family history of malignant neoplasm of digestive organs: Secondary | ICD-10-CM

## 2023-07-31 DIAGNOSIS — E1165 Type 2 diabetes mellitus with hyperglycemia: Secondary | ICD-10-CM | POA: Diagnosis present

## 2023-07-31 DIAGNOSIS — E871 Hypo-osmolality and hyponatremia: Secondary | ICD-10-CM | POA: Diagnosis present

## 2023-07-31 DIAGNOSIS — I350 Nonrheumatic aortic (valve) stenosis: Secondary | ICD-10-CM | POA: Diagnosis present

## 2023-07-31 DIAGNOSIS — M79605 Pain in left leg: Secondary | ICD-10-CM | POA: Diagnosis present

## 2023-07-31 DIAGNOSIS — Z794 Long term (current) use of insulin: Secondary | ICD-10-CM | POA: Diagnosis not present

## 2023-07-31 DIAGNOSIS — R8281 Pyuria: Secondary | ICD-10-CM | POA: Diagnosis not present

## 2023-07-31 DIAGNOSIS — E1169 Type 2 diabetes mellitus with other specified complication: Secondary | ICD-10-CM

## 2023-07-31 DIAGNOSIS — M792 Neuralgia and neuritis, unspecified: Secondary | ICD-10-CM | POA: Diagnosis not present

## 2023-07-31 DIAGNOSIS — R195 Other fecal abnormalities: Secondary | ICD-10-CM | POA: Diagnosis not present

## 2023-07-31 DIAGNOSIS — Z7984 Long term (current) use of oral hypoglycemic drugs: Secondary | ICD-10-CM

## 2023-07-31 DIAGNOSIS — E66813 Obesity, class 3: Secondary | ICD-10-CM | POA: Diagnosis present

## 2023-07-31 DIAGNOSIS — R739 Hyperglycemia, unspecified: Secondary | ICD-10-CM | POA: Diagnosis not present

## 2023-07-31 LAB — CBC
HCT: 25.4 % — ABNORMAL LOW (ref 39.0–52.0)
Hemoglobin: 7.4 g/dL — ABNORMAL LOW (ref 13.0–17.0)
MCH: 21.1 pg — ABNORMAL LOW (ref 26.0–34.0)
MCHC: 29.1 g/dL — ABNORMAL LOW (ref 30.0–36.0)
MCV: 72.6 fL — ABNORMAL LOW (ref 80.0–100.0)
Platelets: 475 10*3/uL — ABNORMAL HIGH (ref 150–400)
RBC: 3.5 MIL/uL — ABNORMAL LOW (ref 4.22–5.81)
RDW: 17.6 % — ABNORMAL HIGH (ref 11.5–15.5)
WBC: 9.3 10*3/uL (ref 4.0–10.5)
nRBC: 0.9 % — ABNORMAL HIGH (ref 0.0–0.2)

## 2023-07-31 LAB — GLUCOSE, CAPILLARY
Glucose-Capillary: 281 mg/dL — ABNORMAL HIGH (ref 70–99)
Glucose-Capillary: 281 mg/dL — ABNORMAL HIGH (ref 70–99)
Glucose-Capillary: 194 mg/dL — ABNORMAL HIGH (ref 70–99)
Glucose-Capillary: 190 mg/dL — ABNORMAL HIGH (ref 70–99)

## 2023-07-31 LAB — BASIC METABOLIC PANEL
Anion gap: 9 (ref 5–15)
BUN: 18 mg/dL (ref 6–20)
CO2: 24 mmol/L (ref 22–32)
Calcium: 8.8 mg/dL — ABNORMAL LOW (ref 8.9–10.3)
Chloride: 99 mmol/L (ref 98–111)
Creatinine, Ser: 0.75 mg/dL (ref 0.61–1.24)
GFR, Estimated: >60 mL/min (ref >60)
Glucose, Bld: 289 mg/dL — ABNORMAL HIGH (ref 70–99)
Potassium: 4.1 mmol/L (ref 3.5–5.1)
Sodium: 132 mmol/L — ABNORMAL LOW (ref 135–145)

## 2023-07-31 LAB — PROTIME-INR
INR: 1.4 — ABNORMAL HIGH (ref 0.8–1.2)
Prothrombin Time: 17.3 s — ABNORMAL HIGH (ref 11.4–15.2)

## 2023-07-31 LAB — MAGNESIUM: Magnesium: 1.7 mg/dL (ref 1.7–2.4)

## 2023-07-31 MED ORDER — OXYCODONE HCL 5 MG PO TABS: 5.0000 mg | ORAL_TABLET | ORAL | Status: DC | PRN

## 2023-07-31 MED ORDER — OXYCODONE HCL 5 MG PO TABS
10.0000 mg | ORAL_TABLET | ORAL | Status: DC | PRN
  Administered 2023-07-31 – 2023-08-10 (×27): 15 mg via ORAL
  Administered 2023-08-11: 10 mg via ORAL
  Filled 2023-07-31 (×27): qty 3

## 2023-07-31 MED ORDER — ONDANSETRON HCL 4 MG/2ML IJ SOLN
4.0000 mg | Freq: Four times a day (QID) | INTRAMUSCULAR | Status: DC | PRN
Start: 2023-07-31 — End: 2023-08-17

## 2023-07-31 MED ORDER — ENOXAPARIN SODIUM 150 MG/ML IJ SOSY
150.0000 mg | PREFILLED_SYRINGE | Freq: Two times a day (BID) | INTRAMUSCULAR | Status: DC
  Administered 2023-07-31: 150 mg via SUBCUTANEOUS
  Filled 2023-07-31 (×2): qty 1

## 2023-07-31 MED ORDER — ENOXAPARIN SODIUM 150 MG/ML IJ SOSY
150.0000 mg | PREFILLED_SYRINGE | Freq: Two times a day (BID) | INTRAMUSCULAR | Status: DC
Start: 2023-07-31 — End: 2023-08-04
  Administered 2023-07-31 – 2023-08-04 (×8): 150 mg via SUBCUTANEOUS
  Filled 2023-07-31 (×8): qty 1

## 2023-07-31 MED ORDER — WARFARIN - PHARMACIST DOSING INPATIENT: Freq: Every day | Status: DC

## 2023-07-31 MED ORDER — OXYCODONE HCL 5 MG PO TABS
5.0000 mg | ORAL_TABLET | ORAL | Status: DC | PRN
  Administered 2023-07-31 – 2023-08-09 (×5): 10 mg via ORAL
  Filled 2023-07-31 (×6): qty 2
  Filled 2023-07-31: qty 1

## 2023-07-31 MED ORDER — INSULIN GLARGINE 100 UNIT/ML SOLOSTAR PEN: 35.0000 [IU] | PEN_INJECTOR | Freq: Every day | SUBCUTANEOUS | Status: DC

## 2023-07-31 MED ORDER — INSULIN ASPART 100 UNIT/ML IJ SOLN: 0.0000 [IU] | Freq: Three times a day (TID) | INTRAMUSCULAR | Status: DC

## 2023-07-31 MED ORDER — METFORMIN HCL 500 MG PO TABS
1000.0000 mg | ORAL_TABLET | Freq: Two times a day (BID) | ORAL | Status: DC
  Administered 2023-07-31 – 2023-08-08 (×16): 1000 mg via ORAL
  Filled 2023-07-31 (×16): qty 2

## 2023-07-31 MED ORDER — INSULIN ASPART 100 UNIT/ML IJ SOLN
0.0000 [IU] | Freq: Three times a day (TID) | INTRAMUSCULAR | Status: DC
  Administered 2023-07-31: 4 [IU] via SUBCUTANEOUS
  Administered 2023-08-01: 7 [IU] via SUBCUTANEOUS
  Administered 2023-08-01: 4 [IU] via SUBCUTANEOUS
  Administered 2023-08-01 – 2023-08-02 (×2): 7 [IU] via SUBCUTANEOUS
  Administered 2023-08-02: 4 [IU] via SUBCUTANEOUS
  Administered 2023-08-02: 7 [IU] via SUBCUTANEOUS
  Administered 2023-08-03 (×2): 4 [IU] via SUBCUTANEOUS
  Administered 2023-08-03: 3 [IU] via SUBCUTANEOUS
  Administered 2023-08-04: 4 [IU] via SUBCUTANEOUS
  Administered 2023-08-04: 3 [IU] via SUBCUTANEOUS
  Administered 2023-08-04 – 2023-08-05 (×4): 4 [IU] via SUBCUTANEOUS
  Administered 2023-08-06: 3 [IU] via SUBCUTANEOUS
  Administered 2023-08-06 (×2): 4 [IU] via SUBCUTANEOUS
  Administered 2023-08-07 (×2): 3 [IU] via SUBCUTANEOUS
  Administered 2023-08-07 – 2023-08-08 (×3): 4 [IU] via SUBCUTANEOUS
  Administered 2023-08-08: 3 [IU] via SUBCUTANEOUS
  Administered 2023-08-09 (×2): 4 [IU] via SUBCUTANEOUS
  Administered 2023-08-09 – 2023-08-15 (×9): 3 [IU] via SUBCUTANEOUS
  Administered 2023-08-16: 4 [IU] via SUBCUTANEOUS

## 2023-07-31 MED ORDER — ENOXAPARIN SODIUM 150 MG/ML IJ SOSY: 150.0000 mg | PREFILLED_SYRINGE | Freq: Two times a day (BID) | INTRAMUSCULAR | Status: DC

## 2023-07-31 MED ORDER — INSULIN GLARGINE-YFGN 100 UNIT/ML ~~LOC~~ SOLN
35.0000 [IU] | Freq: Every day | SUBCUTANEOUS | Status: DC
  Administered 2023-07-31: 35 [IU] via SUBCUTANEOUS
  Filled 2023-07-31: qty 0.35

## 2023-07-31 MED ORDER — INSULIN ASPART 100 UNIT/ML IJ SOLN: 0.0000 [IU] | Freq: Every day | INTRAMUSCULAR | Status: DC

## 2023-07-31 MED ORDER — INSULIN ASPART 100 UNIT/ML IJ SOLN
0.0000 [IU] | Freq: Three times a day (TID) | INTRAMUSCULAR | Status: DC
  Administered 2023-07-31: 11 [IU] via SUBCUTANEOUS

## 2023-07-31 MED ORDER — ONDANSETRON HCL 4 MG PO TABS
4.0000 mg | ORAL_TABLET | Freq: Four times a day (QID) | ORAL | Status: DC | PRN
Start: 2023-07-31 — End: 2023-08-17
  Administered 2023-08-03 – 2023-08-12 (×3): 4 mg via ORAL
  Filled 2023-07-31 (×3): qty 1

## 2023-07-31 MED ORDER — PANTOPRAZOLE SODIUM 40 MG PO TBEC
40.0000 mg | DELAYED_RELEASE_TABLET | Freq: Every day | ORAL | Status: DC
  Administered 2023-08-01 – 2023-08-17 (×17): 40 mg via ORAL
  Filled 2023-07-31 (×17): qty 1

## 2023-07-31 MED ORDER — WARFARIN SODIUM 5 MG PO TABS: 10.0000 mg | ORAL_TABLET | Freq: Once | ORAL | Status: DC

## 2023-07-31 MED ORDER — METOPROLOL TARTRATE 25 MG PO TABS
25.0000 mg | ORAL_TABLET | Freq: Two times a day (BID) | ORAL | Status: DC
  Administered 2023-07-31 – 2023-08-17 (×31): 25 mg via ORAL
  Filled 2023-07-31 (×32): qty 1

## 2023-07-31 MED ORDER — WARFARIN SODIUM 2.5 MG PO TABS
10.0000 mg | ORAL_TABLET | Freq: Once | ORAL | Status: AC
  Administered 2023-07-31: 10 mg via ORAL
  Filled 2023-07-31: qty 4

## 2023-07-31 MED ORDER — WARFARIN SODIUM 10 MG PO TABS: 10.0000 mg | ORAL_TABLET | Freq: Once | ORAL | Status: DC

## 2023-07-31 MED ORDER — LISINOPRIL 20 MG PO TABS
20.0000 mg | ORAL_TABLET | Freq: Every day | ORAL | Status: DC
  Administered 2023-08-01 – 2023-08-04 (×4): 20 mg via ORAL
  Filled 2023-07-31 (×4): qty 1

## 2023-07-31 MED ORDER — EZETIMIBE 10 MG PO TABS
10.0000 mg | ORAL_TABLET | Freq: Every day | ORAL | Status: DC
  Administered 2023-08-01 – 2023-08-17 (×17): 10 mg via ORAL
  Filled 2023-07-31 (×17): qty 1

## 2023-07-31 MED ORDER — VITAMIN C 500 MG PO TABS
1000.0000 mg | ORAL_TABLET | Freq: Every day | ORAL | Status: DC
  Administered 2023-08-01 – 2023-08-17 (×17): 1000 mg via ORAL
  Filled 2023-07-31 (×17): qty 2

## 2023-07-31 MED ORDER — INSULIN GLARGINE-YFGN 100 UNIT/ML ~~LOC~~ SOLN
35.0000 [IU] | Freq: Every day | SUBCUTANEOUS | Status: DC
  Administered 2023-08-01 – 2023-08-02 (×2): 35 [IU] via SUBCUTANEOUS
  Filled 2023-07-31 (×2): qty 0.35

## 2023-07-31 MED ORDER — GABAPENTIN 300 MG PO CAPS
600.0000 mg | ORAL_CAPSULE | Freq: Two times a day (BID) | ORAL | Status: DC
  Administered 2023-07-31 – 2023-08-01 (×2): 600 mg via ORAL
  Filled 2023-07-31 (×2): qty 2

## 2023-07-31 MED ORDER — ZINC SULFATE 220 (50 ZN) MG PO CAPS
220.0000 mg | ORAL_CAPSULE | Freq: Every day | ORAL | Status: DC
  Administered 2023-07-31 – 2023-08-17 (×18): 220 mg via ORAL
  Filled 2023-07-31 (×18): qty 1

## 2023-07-31 MED ORDER — ASCORBIC ACID 1000 MG PO TABS: 1000.0000 mg | ORAL_TABLET | Freq: Every day | ORAL | Status: DC

## 2023-07-31 MED ORDER — OXYCODONE HCL 10 MG PO TABS: 10.0000 mg | ORAL_TABLET | ORAL | Status: DC | PRN

## 2023-07-31 MED ORDER — METHOCARBAMOL 500 MG PO TABS: 500.0000 mg | ORAL_TABLET | Freq: Four times a day (QID) | ORAL | Status: DC | PRN

## 2023-07-31 MED ORDER — SERTRALINE HCL 50 MG PO TABS
50.0000 mg | ORAL_TABLET | Freq: Every day | ORAL | Status: DC
  Administered 2023-08-01 – 2023-08-17 (×17): 50 mg via ORAL
  Filled 2023-07-31 (×17): qty 1

## 2023-07-31 MED ORDER — METHOCARBAMOL 500 MG PO TABS
500.0000 mg | ORAL_TABLET | Freq: Four times a day (QID) | ORAL | Status: DC | PRN
  Administered 2023-08-09 – 2023-08-15 (×4): 500 mg via ORAL
  Filled 2023-07-31 (×4): qty 1

## 2023-07-31 NOTE — H&P (Signed)
Physical Medicine and Rehabilitation Admission H&P    No chief complaint on file. : HPI: Adam Long is a 47 y.o. male with past medical history of hypertension, hyperlipidemia, CAD, s/p AVR on Coumadin, diabetes mellitus with A1c 10.2, PAD with history of left BKA, anxiety, depression, and morbid obesity.  Patient had multiple residual revisions in the past (last revision May 2024 due to dehiscence), presented to the ED on 07/25/23 with purulent drainage and wound dehiscence of his left BKA. Xray showed diffuse soft tissue swelling c/w cellulitis but no acute osteomyelitis, labs without leukocytosis, Hgb 8.1 near baseline, sodium 128 (baseline 130s), glucose 293, lactic WNL x2, Urine with ?UTI vs klebsiella pneumoniae colonization treated with IV abx x4 days, Blood Cx neg x2.  Ultimately he required left AKA completed by Dr. Lajoyce Corners on 07/27/2023 with Prevena wound VAC applied.  He completed 4 days of rocephin and 3 days of flagyl, d/c'd abx on 07/28/23.   Treated for acute on chronic hyponatremia. Hemoglobin 6.6 on 07/29/23, transfused 1U PRBC, up to 7.4 on 07/31/23, to be transfused again if <7. INR subtherapeutic, being bridged with lovenox.   Patient has been evaluated by physical therapy and found to be mod assist for bed mobility and mod assist for lateral scoots along edge of bed.  He has been seen by Occupational Therapy and he requires mod assist for lower body dressing, max assist for lower body bathing. The patient requires inpatient physical medicine and rehabilitation evaluations and treatment secondary to L AKA.    PTA patient lived with his uncle, modified independent for mobility with his left below the knee amputation prosthetic device.  Patient lives in a two-story home but only uses the first floor.  2 steps to enter.  His uncle can assist him after discharge.   Pt reports depressed about needing to have L AKA-  and pain "very sore"- L AKA- no phantom pain.   - feels like due  for pain meds- but it's still ~ 30 minutes at least til due.   LBM yesterday using bedpan- hard to get balanced on it.  Peeing OK.   Review of Systems  Constitutional:  Positive for malaise/fatigue.  HENT: Negative.    Eyes: Negative.   Respiratory:  Negative for cough and shortness of breath.   Cardiovascular:  Positive for leg swelling. Negative for chest pain and palpitations.  Gastrointestinal:  Negative for abdominal pain, constipation, diarrhea, nausea and vomiting.  Genitourinary:  Negative for dysuria and frequency.  Musculoskeletal:  Positive for joint pain and myalgias.  Skin:        LLE Wound VAC  Neurological:  Positive for sensory change. Negative for dizziness and headaches.  Endo/Heme/Allergies: Negative.   Psychiatric/Behavioral:  Positive for depression. The patient has insomnia.   All other systems reviewed and are negative.  Past Medical History:  Diagnosis Date   Anxiety    Aortic stenosis    Cellulitis and abscess of lower extremity 06/11/2019   Cellulitis of fourth toe of left foot    Cholelithiasis    Coronary artery disease    Nonobstructive CAD (40-50% LAD) 08/2019   Depression    Diabetes mellitus without complication (HCC)    Phreesia 09/27/2020   Elevated troponin level not due myocardial infarction 11/11/2019   Essential hypertension    Gangrene of toe of left foot (HCC) 07/06/2019   Heart murmur    Phreesia 09/27/2020   Hyperlipidemia    Phreesia 09/27/2020   Hypertension  Phreesia 09/27/2020   Mixed hyperlipidemia    Morbid obesity (HCC)    S/P aortic valve replacement with mechanical valve 12/05/2019   25 mm Carbomedics top hat bileaflet mechanical valve via partial upper hemi-sternotomy   Severe aortic stenosis 09/24/2019   Type 2 diabetes mellitus (HCC)    Past Surgical History:  Procedure Laterality Date   ABDOMINAL AORTOGRAM W/LOWER EXTREMITY N/A 07/06/2019   Procedure: ABDOMINAL AORTOGRAM W/LOWER EXTREMITY;  Surgeon: Sherren Kerns, MD;  Location: [facility] INVASIVE CV LAB;  Service: Cardiovascular;  Laterality: N/A;  Bilateral   AMPUTATION Left 07/09/2019   Procedure: LEFT FOURTH and Fifth TOE AMPUTATION.;  Surgeon: Larina Earthly, MD;  Location: [facility] OR;  Service: Vascular;  Laterality: Left;   AMPUTATION Left 03/11/2021   Procedure: LEFT FOOT 5TH  AND 4TH RAY AMPUTATION;  Surgeon: Nadara Mustard, MD;  Location: [facility] OR;  Service: Orthopedics;  Laterality: Left;   AMPUTATION Left 08/28/2021   Procedure: AMPUTATION BELOW KNEE;  Surgeon: Nadara Mustard, MD;  Location: [facility] OR;  Service: Orthopedics;  Laterality: Left;   AMPUTATION Left 01/29/2023   Procedure: LEFT AMPUTATION BELOW KNEE REVISION AND CLOSURE;  Surgeon: Nadara Mustard, MD;  Location: [facility] OR;  Service: Orthopedics;  Laterality: Left;   AMPUTATION Left 07/27/2023   Procedure: LEFT ABOVE KNEE AMPUTATION;  Surgeon: Nadara Mustard, MD;  Location: [facility] OR;  Service: Orthopedics;  Laterality: Left;   AORTIC VALVE REPLACEMENT N/A 12/05/2019   Procedure: PARTIAL STERNOTOMY FOR AORTIC VALVE REPLACEMENT (AVR), USING CARBOMEDICS SUPRA-ANNULAR TOP HAT ;  Surgeon: Purcell Nails, MD;  Location: [facility] OR;  Service: Open Heart Surgery;  Laterality: N/A;  No neck lines on left   APPLICATION OF WOUND VAC Left 10/30/2021   Procedure: APPLICATION OF WOUND VAC;  Surgeon: Nadara Mustard, MD;  Location: [facility] OR;  Service: Orthopedics;  Laterality: Left;   APPLICATION OF WOUND VAC Left 12/25/2021   Procedure: APPLICATION OF WOUND VAC;  Surgeon: Nadara Mustard, MD;  Location: [facility] OR;  Service: Orthopedics;  Laterality: Left;   APPLICATION OF WOUND VAC Left 01/26/2023   Procedure: APPLICATION OF WOUND VAC TO LEFT STUMP;  Surgeon: Tarry Kos, MD;  Location: [facility] OR;  Service: Orthopedics;  Laterality: Left;   CARDIAC VALVE REPLACEMENT N/A    Phreesia 09/27/2020   I & D EXTREMITY Left 01/26/2023   Procedure: IRRIGATION AND DEBRIDEMENT OF LEFT BKA STUMP;  Surgeon: Tarry Kos, MD;  Location: [facility] OR;   Service: Orthopedics;  Laterality: Left;   IR RADIOLOGY PERIPHERAL GUIDED IV START  10/05/2019   IR US GUIDE VASC ACCESS RIGHT  10/05/2019   MULTIPLE EXTRACTIONS WITH ALVEOLOPLASTY N/A 10/26/2019   Procedure: EXTRACTION OF TOOTH #'S 3, 5-11,19-28,  AND 32 WITH ALVEOLOPLASTY;  Surgeon: Charlynne Pander, DDS;  Location: [facility] OR;  Service: Oral Surgery;  Laterality: N/A;   RIGHT HEART CATH AND CORONARY ANGIOGRAPHY N/A 09/24/2019   Procedure: RIGHT HEART CATH AND CORONARY ANGIOGRAPHY;  Surgeon: Tonny Bollman, MD;  Location: [facility] INVASIVE CV LAB;  Service: Cardiovascular;  Laterality: N/A;   STUMP REVISION Left 10/30/2021   Procedure: REVISION LEFT BELOW KNEE AMPUTATION;  Surgeon: Nadara Mustard, MD;  Location: [facility] OR;  Service: Orthopedics;  Laterality: Left;   STUMP REVISION Left 12/25/2021   Procedure: REVISION LEFT BELOW KNEE AMPUTATION;  Surgeon: Nadara Mustard, MD;  Location: [facility] OR;  Service: Orthopedics;  Laterality: Left;   TEE WITHOUT CARDIOVERSION N/A 12/05/2019   Procedure: TRANSESOPHAGEAL ECHOCARDIOGRAM (  TEE);  Surgeon: Purcell Nails, MD;  Location: [facility] OR;  Service: Open Heart Surgery;  Laterality: N/A;   TEE WITHOUT CARDIOVERSION N/A 09/01/2021   Procedure: TRANSESOPHAGEAL ECHOCARDIOGRAM (TEE);  Surgeon: Little Ishikawa, MD;  Location: [facility] ENDOSCOPY;  Service: Cardiovascular;  Laterality: N/A;   Family History  Problem Relation Age of Onset   Cancer Mother        Brain tumor   Heart disease Father    Hyperlipidemia Father    Hypertension Father    Stroke Father    Heart murmur Sister    Heart attack Maternal Grandmother    Liver disease Maternal Grandfather    Breast cancer Paternal Grandmother    COPD Paternal Grandfather    Heart attack Maternal Aunt    Colon cancer Maternal Uncle 67   Pancreatic cancer Paternal Aunt    Heart disease Paternal Uncle    Prostate cancer Neg Hx    Social History:  reports that he has never smoked. He quit smokeless tobacco use about 3 years  ago.  His smokeless tobacco use included chew. He reports current alcohol use of about 4.0 standard drinks of alcohol per week. He reports that he does not use drugs. Allergies: No Known Allergies Medications Prior to Admission  Medication Sig Dispense Refill   Continuous Glucose Sensor (FREESTYLE LIBRE 3 SENSOR) MISC Place 1 sensor on the skin every 14 days. Use to check glucose continuously 6 each 6   [START ON 08/01/2023] ascorbic acid (VITAMIN C) 1000 MG tablet Take 1 tablet (1,000 mg total) by mouth daily.     enoxaparin (LOVENOX) 150 MG/ML injection Inject 1 mL (150 mg total) into the skin every 12 (twelve) hours.     ezetimibe (ZETIA) 10 MG tablet Take 1 tablet (10 mg total) by mouth daily. 90 tablet 3   gabapentin (NEURONTIN) 300 MG capsule Take 2 capsules (600 mg total) by mouth 2 (two) times daily. 360 capsule 3   insulin aspart (NOVOLOG) 100 UNIT/ML injection Inject 0-20 Units into the skin 3 (three) times daily with meals.     insulin aspart (NOVOLOG) 100 UNIT/ML injection Inject 0-5 Units into the skin at bedtime.     insulin glargine (LANTUS) 100 UNIT/ML Solostar Pen Inject 35 Units into the skin daily.     Insulin Pen Needle 32G X 4 MM MISC Use with Lantus flexpen 100 each 0   lisinopril (ZESTRIL) 20 MG tablet Take 1 tablet (20 mg total) by mouth daily. 90 tablet 3   metFORMIN (GLUCOPHAGE) 500 MG tablet Take 2 tablets (1,000 mg total) by mouth 2 (two) times daily with a meal. 360 tablet 3   methocarbamol (ROBAXIN) 500 MG tablet Take 1 tablet (500 mg total) by mouth every 6 (six) hours as needed for muscle spasms.     metoprolol tartrate (LOPRESSOR) 25 MG tablet Take 1 tablet (25 mg total) by mouth 2 (two) times daily. 180 tablet 3   oxyCODONE (OXY IR/ROXICODONE) 5 MG immediate release tablet Take 1-2 tablets (5-10 mg total) by mouth every 4 (four) hours as needed for moderate pain (pain score 4-6) (pain score 4-6).     oxyCODONE 10 MG TABS Take 1-1.5 tablets (10-15 mg total) by mouth  every 4 (four) hours as needed for severe pain (pain score 7-10) (pain score 7-10).     pantoprazole (PROTONIX) 40 MG tablet Take 1 tablet (40 mg total) by mouth daily. 90 tablet 3   sertraline (ZOLOFT) 50 MG tablet Take 1 tablet (  50 mg total) by mouth daily. 90 tablet 3   warfarin (COUMADIN) 10 MG tablet Take 1 tablet (10 mg total) by mouth one time only at 4 PM.     Zinc Sulfate 220 (50 Zn) MG TABS Take 1 tablet (220 mg total) by mouth daily. 100 tablet 0      Home:     Functional History:    Functional Status:  Mobility:          ADL:    Cognition:      Physical Exam: There were no vitals taken for this visit. Physical Exam Vitals and nursing note reviewed.  Constitutional:      Appearance: He is obese. He is ill-appearing.     Comments: Pt sitting up in bed; morbidly obese- BMI 53 with L AKA; awake, alert, appropriate, NAD  HENT:     Head: Normocephalic and atraumatic.     Right Ear: External ear normal.     Left Ear: External ear normal.     Nose: Nose normal. No congestion.     Mouth/Throat:     Mouth: Mucous membranes are dry.     Pharynx: Oropharynx is clear. No oropharyngeal exudate.  Eyes:     General:        Right eye: No discharge.        Left eye: No discharge.     Extraocular Movements: Extraocular movements intact.  Cardiovascular:     Rate and Rhythm: Normal rate and regular rhythm.     Heart sounds: Murmur heard.     No gallop.  Pulmonary:     Effort: Pulmonary effort is normal. No respiratory distress.     Breath sounds: Normal breath sounds. No wheezing, rhonchi or rales.  Abdominal:     General: There is no distension.     Palpations: Abdomen is soft.     Tenderness: There is no abdominal tenderness.     Comments: protuberant  Musculoskeletal:     Cervical back: Neck supple. No tenderness.     Comments: Ues 5/5 in biceps, triceps, WE, grip and FA B/L RLE- 5-/5 in HF, KE, DF and PF LLE- 2-/5 in HF L AKA with wound VAC in place   Skin:    General: Skin is warm and dry.     Comments: Venous stasis changes on RLE from mid calf downwards- and 1-2+ LE edema from R ankle VAC on LLE- with ~ 100cc in cannister of sanguinous drainage  Neurological:     Mental Status: He is alert and oriented to person, place, and time. Mental status is at baseline.     Comments: Decreased to light touch in RLE form knee downwards  Psychiatric:     Comments: Depressed, flat affect     Results for orders placed or performed during the hospital encounter of 07/25/23 (from the past 48 hour(s))  Glucose, capillary     Status: Abnormal   Collection Time: 07/29/23  4:39 PM  Result Value Ref Range   Glucose-Capillary 179 (H) 70 - 99 mg/dL    Comment: Glucose reference range applies only to samples taken after fasting for at least 8 hours.  Glucose, capillary     Status: Abnormal   Collection Time: 07/29/23  9:41 PM  Result Value Ref Range   Glucose-Capillary 156 (H) 70 - 99 mg/dL    Comment: Glucose reference range applies only to samples taken after fasting for at least 8 hours.  Protime-INR     Status: Abnormal  Collection Time: 07/30/23  3:28 AM  Result Value Ref Range   Prothrombin Time 19.1 (H) 11.4 - 15.2 seconds   INR 1.6 (H) 0.8 - 1.2    Comment: (NOTE) INR goal varies based on device and disease states. Performed at [facility], [address], [place], [place] [address]   CBC with Differential/Platelet     Status: Abnormal   Collection Time: 07/30/23  3:28 AM  Result Value Ref Range   WBC 9.8 4.0 - 10.5 K/uL   RBC 3.33 (L) 4.22 - 5.81 MIL/uL   Hemoglobin 7.1 (L) 13.0 - 17.0 g/dL    Comment: Reticulocyte Hemoglobin testing may be clinically indicated, consider ordering this additional test [id number]    HCT 24.4 (L) 39.0 - 52.0 %   MCV 73.3 (L) 80.0 - 100.0 fL   MCH 21.3 (L) 26.0 - 34.0 pg   MCHC 29.1 (L) 30.0 - 36.0 g/dL   RDW 11.9 (H) 14.7 - 82.9 %   Platelets 430 (H) 150 - 400 K/uL   nRBC 0.9 (H) 0.0 -  0.2 %   Neutrophils Relative % 61 %   Neutro Abs 6.1 1.7 - 7.7 K/uL   Lymphocytes Relative 22 %   Lymphs Abs 2.1 0.7 - 4.0 K/uL   Monocytes Relative 9 %   Monocytes Absolute 0.9 0.1 - 1.0 K/uL   Eosinophils Relative 2 %   Eosinophils Absolute 0.2 0.0 - 0.5 K/uL   Basophils Relative 1 %   Basophils Absolute 0.1 0.0 - 0.1 K/uL   Immature Granulocytes 5 %   Abs Immature Granulocytes 0.45 (H) 0.00 - 0.07 K/uL    Comment: Performed at [facility], [address], [place], [place] [address]  Basic metabolic panel     Status: Abnormal   Collection Time: 07/30/23  3:28 AM  Result Value Ref Range   Sodium 131 (L) 135 - 145 mmol/L   Potassium 4.4 3.5 - 5.1 mmol/L   Chloride 101 98 - 111 mmol/L   CO2 25 22 - 32 mmol/L   Glucose, Bld 220 (H) 70 - 99 mg/dL    Comment: Glucose reference range applies only to samples taken after fasting for at least 8 hours.   BUN 15 6 - 20 mg/dL   Creatinine, Ser 0.86 0.61 - 1.24 mg/dL   Calcium 8.2 (L) 8.9 - 10.3 mg/dL   GFR, Estimated >57 >84 mL/min    Comment: (NOTE) Calculated using the CKD-EPI Creatinine Equation (2021)    Anion gap 5 5 - 15    Comment: Performed at [facility], [address], [place], [place] [address]  Magnesium     Status: None   Collection Time: 07/30/23  3:28 AM  Result Value Ref Range   Magnesium 1.8 1.7 - 2.4 mg/dL    Comment: Performed at [facility], [address] [place], [place] [address]  Glucose, capillary     Status: Abnormal   Collection Time: 07/30/23  8:28 AM  Result Value Ref Range   Glucose-Capillary 198 (H) 70 - 99 mg/dL    Comment: Glucose reference range applies only to samples taken after fasting for at least 8 hours.  Glucose, capillary     Status: Abnormal   Collection Time: 07/30/23 11:58 AM  Result Value Ref Range   Glucose-Capillary 237 (H) 70 - 99 mg/dL    Comment: Glucose reference range applies only to samples taken after fasting for at least 8 hours.  Glucose, capillary      Status: Abnormal   Collection Time: 07/30/23  5:10 PM  Result Value Ref Range   Glucose-Capillary 238 (H) 70 - 99 mg/dL    Comment: Glucose reference range applies only to samples taken after fasting for at least 8 hours.  Glucose, capillary     Status: Abnormal   Collection Time: 07/30/23  9:48 PM  Result Value Ref Range   Glucose-Capillary 318 (H) 70 - 99 mg/dL    Comment: Glucose reference range applies only to samples taken after fasting for at least 8 hours.  Protime-INR     Status: Abnormal   Collection Time: 07/31/23  3:15 AM  Result Value Ref Range   Prothrombin Time 17.3 (H) 11.4 - 15.2 seconds   INR 1.4 (H) 0.8 - 1.2    Comment: (NOTE) INR goal varies based on device and disease states. Performed at [facility], [address] [place], [place] [address]   CBC     Status: Abnormal   Collection Time: 07/31/23  3:15 AM  Result Value Ref Range   WBC 9.3 4.0 - 10.5 K/uL   RBC 3.50 (L) 4.22 - 5.81 MIL/uL   Hemoglobin 7.4 (L) 13.0 - 17.0 g/dL    Comment: Reticulocyte Hemoglobin testing may be clinically indicated, consider ordering this additional test [id number]    HCT 25.4 (L) 39.0 - 52.0 %   MCV 72.6 (L) 80.0 - 100.0 fL   MCH 21.1 (L) 26.0 - 34.0 pg   MCHC 29.1 (L) 30.0 - 36.0 g/dL   RDW 11.9 (H) 14.7 - 82.9 %   Platelets 475 (H) 150 - 400 K/uL   nRBC 0.9 (H) 0.0 - 0.2 %    Comment: Performed at [facility], [address], [place], [place] [address]  Basic metabolic panel     Status: Abnormal   Collection Time: 07/31/23  3:15 AM  Result Value Ref Range   Sodium 132 (L) 135 - 145 mmol/L   Potassium 4.1 3.5 - 5.1 mmol/L   Chloride 99 98 - 111 mmol/L   CO2 24 22 - 32 mmol/L   Glucose, Bld 289 (H) 70 - 99 mg/dL    Comment: Glucose reference range applies only to samples taken after fasting for at least 8 hours.   BUN 18 6 - 20 mg/dL   Creatinine, Ser 0.86 0.61 - 1.24 mg/dL   Calcium 8.8 (L) 8.9 - 10.3 mg/dL   GFR, Estimated >57 >84 mL/min     Comment: (NOTE) Calculated using the CKD-EPI Creatinine Equation (2021)    Anion gap 9 5 - 15    Comment: Performed at [facility], [address] [place], [place] [address]  Magnesium     Status: None   Collection Time: 07/31/23  3:15 AM  Result Value Ref Range   Magnesium 1.7 1.7 - 2.4 mg/dL    Comment: Performed at [facility], [address] [place], [place] [address]  Glucose, capillary     Status: Abnormal   Collection Time: 07/31/23  8:18 AM  Result Value Ref Range   Glucose-Capillary 281 (H) 70 - 99 mg/dL    Comment: Glucose reference range applies only to samples taken after fasting for at least 8 hours.  Glucose, capillary     Status: Abnormal   Collection Time: 07/31/23 12:01 PM  Result Value Ref Range   Glucose-Capillary 281 (H) 70 - 99 mg/dL    Comment: Glucose  reference range applies only to samples taken after fasting for at least 8 hours.   No results found.    There were no vitals taken for this visit.  Medical Problem List and Plan: 1. Functional deficits secondary to infected L BKA s/p AKA on 07/27/23   -patient may not shower until VAC removed  -ELOS/Goals: 10-12 days- supervision to min A at w/c level 2.  Antithrombotics: -DVT/anticoagulation:  Pharmaceutical: Lovenox 150 mg BID until Coumadin/INR therapeutic  -antiplatelet therapy: n/a 3. Pain Management: chronic pain and neuropathy-  Con't tylenol prn, Oxycodone 5-15 mg q4 hours prn and gabapentin  scheduled- as well as Robaxin prn 4. Mood/Behavior/Sleep/Anxiety/Depression:   -Continue sertraline 50mg  daily and Gabapentin 600mg  BID  Also team milieu to help with mood and anxiety -antipsychotic agents: n/a 5. Neuropsych/cognition: This patient is capable of making decisions on his own behalf. 6. Skin/Wound Care: has VAC -for 7 days from 07/27/23 7. Fluids/Electrolytes/Nutrition/chronic hyponatremia: current Na 132- will recheck in AM  -Continue vitamins and supplements 8. Diabetes,  poorly controlled, Hgb A1C 10.2: SSI, increased semglee to 35U on d/c from acute, metformin to be resumed upon admission to CIR 1000 mg (home dose)  BID-AC 9. Acute on chronic anemia: Hgb 7.4 after 1U PRBCs on 11/1, transfuse if <7 10. HTN: continue metoprolol 25mg  BID and lisinopril 20mg  daily 11. HLD: continue zetia 10mg  daily 12. CAD and mechanical aortic valve: on coumadin resumed on 07/28/23, pharmacy following, bridging with lovenox 13. GERD: continue protonix 40mg  daily 14. Morbid obesity: provide dietary education 15. Thrombocytosis: likely reactive, recheck labs in AM and monitor 16. MRSA positive nasal swab: mupirocin BID 17. L BKA stump infection- done with IV ABX since amputation removed infection. .  18. Microscopic hematuria- has nonobstructive renal stones- monitor clinically.  19. GERD-con't PPI   Genice Rouge, MD 07/31/2023    I have personally performed a face to face diagnostic evaluation of this patient and formulated the key components of the plan.  Additionally, I have personally reviewed laboratory data, imaging studies, as well as relevant notes and concur with the physician assistant's documentation above.   The patient's status has not changed from the original H&P.  Any changes in documentation from the acute care chart have been noted above.

## 2023-07-31 NOTE — Progress Notes (Signed)
Inpatient Rehabilitation Admission Medication Review by a Pharmacist  A complete drug regimen review was completed for this patient to identify any potential clinically significant medication issues.  High Risk Drug Classes Is patient taking? Indication by Medication  Antipsychotic No   Anticoagulant Yes Lovenox - s/p AVR Warfarin - s/p AVR  Antibiotic No   Opioid Yes Oxycodone - Pain  Antiplatelet No   Hypoglycemics/insulin Yes Novolog - diabetes Lantus - diabetes  Vasoactive Medication Yes Lisinopril - HTN Metoprolol - HTN  Chemotherapy No   Other Yes Vitamin c - supplementation Zinc - supplementation  Zetia - HLD Gabapentin - neuropathic pain  Robaxin - muscle relaxer Protonix - GERD Zoloft - mood     Type of Medication Issue Identified Description of Issue Recommendation(s)  Drug Interaction(s) (clinically significant)     Duplicate Therapy     Allergy     No Medication Administration End Date     Incorrect Dose     Additional Drug Therapy Needed     Significant med changes from prior encounter (inform family/care partners about these prior to discharge).    Other       Clinically significant medication issues were identified that warrant physician communication and completion of prescribed/recommended actions by midnight of the next day:  No  Pharmacist comments: none  Time spent performing this drug regimen review (minutes):  20 minutes   Ruben Im, PharmD Clinical Pharmacist 07/31/2023 2:59 PM Please check AMION for all [facility] numbers

## 2023-07-31 NOTE — Discharge Summary (Signed)
Physician Discharge Summary  ARIAS WEINERT PIR:[id number] DOB: 1976/04/26 DOA: 07/25/2023  PCP: [name], DO  Admit date: 07/25/2023 Discharge date: 07/31/2023  Admitted From: Home Disposition: CIR  Recommendations for Outpatient Follow-up:  Follow up with CIR provider at earliest convenience with repeat CBC/INR in a.m.  Outpatient follow-up with orthopedics/Dr. Lajoyce Corners.  Wound/wound VAC care as per orthopedics     Home Health: No Equipment/Devices: None  Discharge Condition: Stable CODE STATUS: Full Diet recommendation: Heart healthy/carb modified  Brief/Interim Summary: 47yo with h/o CAD, HTN, HLD, s/p AVR on Coumadin, morbid obesity, DM, and PAD s/p L BKA with stump dehiscence and MRSA infection presented on 10/28 with recurrent stump infection. He was started on broad-spectrum antibiotics. He underwent left AKA on 07/27/2023.  Subsequently, PT recommended CIR placement.  He underwent 1 unit packed red cell transfusion on 07/29/2023 for hemoglobin of 6.6.  Hemoglobin has subsequently remained stable.  He will be discharged to CIR once bed is available.  Discharge Diagnoses:   Left BKA stump infection -underwent left AKA and wound VAC placement on 07/27/2023.  Wound and VAC care as per orthopedics recommendations.  Outpatient follow-up with orthopedics.  Antibiotics discontinued on 07/28/2023. -PT/OT recommending CIR.  CIR following.  Currently medically stable for discharge to CIR.   Acute on chronic anemia of chronic disease Possibly from chronic illnesses.  Hemoglobin 6.6 on 07/29/2023.  Transfused 1 unit of packed red cells.  Hemoglobin 7.4 today.  Transfuse if hemoglobin is less than 7.  Monitor H&H.     Acute on chronic hyponatremia Sodium 128 on presentation.  Sodium 132 today.  Monitor intermittently.  Encourage oral intake.   Poorly controlled insulin-dependent type 2 diabetes A1c 10.2. Increase long-acting insulin to 35 units daily.  Continue CBGs with SSI.   Metformin to be resumed on discharge to CIR.  Carb modified diet.   ?UTI UA with negative nitrite, trace leukocytes, and microscopy showing >50 RBCs, >50 WBCs, and few bacteria Patient is not endorsing any urinary symptoms, vomiting, or abdominal/flank pain at present Already on antibiotics for stump site infection/cellulitis Urine culture grew Klebsiella abdomen: Unclear if this is colonizers.  Treated with 4 days of IV antibiotics and discontinued on 07/28/2023.    Microscopic hematuria CT done 06/28/2023 showing nonobstructing left renal stones Patient denies any abdominal or flank pain at present   CAD Not endorsing any anginal symptoms.  Continue ezetimibe and metoprolol.   Hypertension Continue metoprolol and lisinopril   Hyperlipidemia Continue Zetia   Mechanical aortic valve Pharmacy following.  Coumadin resumed on 07/28/2023 after clearance from orthopedics.  Monitor INR.  INR 1.4 today.  Start bridging Lovenox.  Pharmacy to dose Coumadin.   Anxiety and depression Continue Zoloft   Chronic pain/neuropathy Continue gabapentin   GERD Continue Protonix   Morbid obesity -Outpatient follow-up   Thrombocytosis -Possibly reactive.  Monitor intermittently.   Discharge Instructions  Discharge Instructions     Diet - low sodium heart healthy   Complete by: As directed    Diet Carb Modified   Complete by: As directed    Discharge wound care:   Complete by: As directed    As per Dr. Audrie Lia recommendations   Increase activity slowly   Complete by: As directed       Allergies as of 07/31/2023   No Known Allergies      Medication List     TAKE these medications    ascorbic acid 1000 MG tablet Commonly known as: VITAMIN C Take  1 tablet (1,000 mg total) by mouth daily. Start taking on: August 01, 2023   enoxaparin 150 MG/ML injection Commonly known as: LOVENOX Inject 1 mL (150 mg total) into the skin every 12 (twelve) hours.   ezetimibe 10 MG  tablet Commonly known as: Zetia Take 1 tablet (10 mg total) by mouth daily.   FreeStyle Libre 3 Sensor Misc Place 1 sensor on the skin every 14 days. Use to check glucose continuously   gabapentin 300 MG capsule Commonly known as: NEURONTIN Take 2 capsules (600 mg total) by mouth 2 (two) times daily.   insulin aspart 100 UNIT/ML injection Commonly known as: novoLOG Inject 0-20 Units into the skin 3 (three) times daily with meals.   insulin aspart 100 UNIT/ML injection Commonly known as: novoLOG Inject 0-5 Units into the skin at bedtime.   insulin glargine 100 UNIT/ML Solostar Pen Commonly known as: LANTUS Inject 35 Units into the skin daily. What changed: how much to take   lisinopril 20 MG tablet Commonly known as: ZESTRIL Take 1 tablet (20 mg total) by mouth daily.   metFORMIN 500 MG tablet Commonly known as: GLUCOPHAGE Take 2 tablets (1,000 mg total) by mouth 2 (two) times daily with a meal.   methocarbamol 500 MG tablet Commonly known as: ROBAXIN Take 1 tablet (500 mg total) by mouth every 6 (six) hours as needed for muscle spasms.   metoprolol tartrate 25 MG tablet Commonly known as: LOPRESSOR Take 1 tablet (25 mg total) by mouth 2 (two) times daily.   oxyCODONE 5 MG immediate release tablet Commonly known as: Oxy IR/ROXICODONE Take 1-2 tablets (5-10 mg total) by mouth every 4 (four) hours as needed for moderate pain (pain score 4-6) (pain score 4-6).   Oxycodone HCl 10 MG Tabs Take 1-1.5 tablets (10-15 mg total) by mouth every 4 (four) hours as needed for severe pain (pain score 7-10) (pain score 7-10).   pantoprazole 40 MG tablet Commonly known as: PROTONIX Take 1 tablet (40 mg total) by mouth daily.   sertraline 50 MG tablet Commonly known as: ZOLOFT Take 1 tablet (50 mg total) by mouth daily.   TechLite Pen Needles 32G X 4 MM Misc Generic drug: Insulin Pen Needle Use with Lantus flexpen   warfarin 10 MG tablet Commonly known as: COUMADIN Take as  directed. If you are unsure how to take this medication, talk to your nurse or doctor. Original instructions: Take 1 tablet (10 mg total) by mouth one time only at 4 PM. What changed:  how much to take how to take this when to take this additional instructions   Zinc Sulfate 220 (50 Zn) MG Tabs Take 1 tablet (220 mg total) by mouth daily.               Discharge Care Instructions  (From admission, onward)           Start     Ordered   07/31/23 0000  Discharge wound care:       Comments: As per Dr. Audrie Lia recommendations   07/31/23 4332            Follow-up Information     Nadara Mustard, MD Follow up in 1 week(s).   Specialty: Orthopedic Surgery Contact information: [address] [place] [address] [phone]                No Known Allergies  Consultations: Orthopedics   Procedures/Studies: DG Knee 2 Views Left  Result Date: 07/25/2023 CLINICAL DATA:  Wound infection, diabetes EXAM: LEFT KNEE - 1-2 VIEW COMPARISON:  01/23/2023 FINDINGS: Frontal and lateral views of the left knee are obtained. Prior below-knee amputation. No acute fracture, subluxation, or dislocation. Joint spaces are well preserved. There is diffuse subcutaneous edema. No evidence of subcutaneous gas or radiopaque foreign body. No evidence of bony erosion or periosteal reaction to suggest acute osteomyelitis. IMPRESSION: 1. Diffuse soft tissue swelling consistent with cellulitis. 2. No evidence of bony destruction or periosteal reaction to suggest acute osteomyelitis. The appearance of the below-knee amputation site is similar to prior CT exam. Electronically Signed   By: Sharlet Salina M.D.   On: 07/25/2023 19:12   DG Chest 2 View  Result Date: 07/25/2023 CLINICAL DATA:  Diabetes, hypertension, wound infection EXAM: CHEST - 2 VIEW COMPARISON:  02/06/2023 FINDINGS: Frontal and lateral views of the chest demonstrate stable enlargement of the cardiac silhouette. No airspace  disease, effusion, or pneumothorax. No acute bony abnormalities. IMPRESSION: 1. Stable chest, no acute process. Electronically Signed   By: Sharlet Salina M.D.   On: 07/25/2023 19:10      Subjective: Patient seen and examined at bedside.  No chest pain, fever, vomiting reported.   Discharge Exam: Vitals:   07/31/23 0436 07/31/23 0819  BP: 119/78 132/69  Pulse: 82 78  Resp: 18 17  Temp:  98 F (36.7 C)  SpO2: 100% 98%    General: No acute distress.  Remains on room air.   Respiratory: Bilateral decreased breath sounds at bases with scattered crackles CVS: S2 heard; rate mostly controlled abdominal: Soft, morbidly obese, nontender, remains mildly distended; no organomegaly; normal bowel sounds are heard extremities: Has left AKA with dressing and wound VAC      The results of significant diagnostics from this hospitalization (including imaging, microbiology, ancillary and laboratory) are listed below for reference.     Microbiology: Recent Results (from the past 240 hour(s))  Urine Culture     Status: Abnormal   Collection Time: 07/25/23  3:33 PM   Specimen: Urine, Random  Result Value Ref Range Status   Specimen Description URINE, RANDOM  Final   Special Requests   Final    NONE Reflexed from [id number] Performed at [facility], [address], [place], [place] [address]    Culture >=100,000 COLONIES/mL KLEBSIELLA PNEUMONIAE (A)  Final   Report Status 07/27/2023 FINAL  Final   Organism ID, Bacteria KLEBSIELLA PNEUMONIAE (A)  Final      Susceptibility   Klebsiella pneumoniae - MIC*    AMPICILLIN RESISTANT Resistant     CEFAZOLIN <=4 SENSITIVE Sensitive     CEFEPIME <=0.12 SENSITIVE Sensitive     CEFTRIAXONE <=0.25 SENSITIVE Sensitive     CIPROFLOXACIN <=0.25 SENSITIVE Sensitive     GENTAMICIN <=1 SENSITIVE Sensitive     IMIPENEM <=0.25 SENSITIVE Sensitive     NITROFURANTOIN 64 INTERMEDIATE Intermediate     TRIMETH/SULFA <=20 SENSITIVE Sensitive      AMPICILLIN/SULBACTAM <=2 SENSITIVE Sensitive     PIP/TAZO <=4 SENSITIVE Sensitive ug/mL    * >=100,000 COLONIES/mL KLEBSIELLA PNEUMONIAE  Culture, blood (Routine x 2)     Status: None   Collection Time: 07/25/23  3:44 PM   Specimen: BLOOD  Result Value Ref Range Status   Specimen Description BLOOD SITE NOT SPECIFIED  Final   Special Requests   Final    BOTTLES DRAWN AEROBIC AND ANAEROBIC Blood Culture adequate volume   Culture   Final    NO GROWTH 5 DAYS Performed  at [facility], [address] [place], [place] [address]    Report Status 07/30/2023 FINAL  Final  Culture, blood (Routine x 2)     Status: None   Collection Time: 07/25/23  8:59 PM   Specimen: BLOOD  Result Value Ref Range Status   Specimen Description BLOOD SITE NOT SPECIFIED  Final   Special Requests   Final    BOTTLES DRAWN AEROBIC AND ANAEROBIC Blood Culture results may not be optimal due to an excessive volume of blood received in culture bottles   Culture   Final    NO GROWTH 5 DAYS Performed at [facility], [address], [place], [place] [address]    Report Status 07/30/2023 FINAL  Final  Surgical pcr screen     Status: Abnormal   Collection Time: 07/26/23 11:57 PM   Specimen: Nasal Mucosa; Nasal Swab  Result Value Ref Range Status   MRSA, PCR POSITIVE (A) NEGATIVE Final    Comment: RESULT CALLED TO, READ BACK BY AND VERIFIED WITH: A OPOKU,RN@0222  07/27/23 MK    Staphylococcus aureus POSITIVE (A) NEGATIVE Final    Comment: (NOTE) The Xpert SA Assay (FDA approved for NASAL specimens in patients 73 years of age and older), is one component of a comprehensive surveillance program. It is not intended to diagnose infection nor to guide or monitor treatment. Performed at [facility], [address], [place], [place] [address]      Labs: BNP (last 3 results) Recent Labs    02/09/23 0340 02/10/23 0355 02/11/23 0339  BNP 491.6* 292.8* 136.8*   Basic Metabolic Panel: Recent Labs   Lab 07/26/23 2254 07/27/23 0839 07/29/23 0740 07/30/23 0328 07/31/23 0315  NA 132* 134* 133* 131* 132*  K 4.7 4.2 4.0 4.4 4.1  CL 100 101 103 101 99  CO2 23 26 23 25 24   GLUCOSE 260* 285* 210* 220* 289*  BUN 12 12 15 15 18   CREATININE 1.12 1.01 0.79 0.87 0.75  CALCIUM 8.6* 8.8* 8.0* 8.2* 8.8*  MG  --   --  1.8 1.8 1.7   Liver Function Tests: Recent Labs  Lab 07/25/23 1544 07/26/23 2254  AST 24 33  ALT 23 25  ALKPHOS 53 49  BILITOT 0.6 0.4  PROT 7.3 6.7  ALBUMIN 2.6* 2.4*   No results for input(s): "LIPASE", "AMYLASE" in the last 168 hours. No results for input(s): "AMMONIA" in the last 168 hours. CBC: Recent Labs  Lab 07/25/23 1544 07/26/23 0643 07/27/23 0839 07/29/23 0740 07/30/23 0328 07/31/23 0315  WBC 8.6 8.5 8.5 11.7* 9.8 9.3  NEUTROABS 6.3  --  6.1 8.0* 6.1  --   HGB 8.1* 7.5* 8.3* 6.6* 7.1* 7.4*  HCT 28.4* 26.0* 29.3* 22.7* 24.4* 25.4*  MCV 73.8* 73.2* 73.4* 73.2* 73.3* 72.6*  PLT 390 365 427* 425* 430* 475*   Cardiac Enzymes: No results for input(s): "CKTOTAL", "CKMB", "CKMBINDEX", "TROPONINI" in the last 168 hours. BNP: Invalid input(s): "POCBNP" CBG: Recent Labs  Lab 07/30/23 0828 07/30/23 1158 07/30/23 1710 07/30/23 2148 07/31/23 0818  GLUCAP 198* 237* 238* 318* 281*   D-Dimer No results for input(s): "DDIMER" in the last 72 hours. Hgb A1c No results for input(s): "HGBA1C" in the last 72 hours. Lipid Profile No results for input(s): "CHOL", "HDL", "LDLCALC", "TRIG", "CHOLHDL", "LDLDIRECT" in the last 72 hours. Thyroid function studies No results for input(s): "TSH", "T4TOTAL", "T3FREE", "THYROIDAB" in the last 72 hours.  Invalid input(s): "FREET3" Anemia work up No  results for input(s): "VITAMINB12", "FOLATE", "FERRITIN", "TIBC", "IRON", "RETICCTPCT" in the last 72 hours. Urinalysis    Component Value Date/Time   COLORURINE YELLOW 07/25/2023 1533   APPEARANCEUR CLOUDY (A) 07/25/2023 1533   LABSPEC 1.018 07/25/2023 1533    PHURINE 5.0 07/25/2023 1533   GLUCOSEU >=500 (A) 07/25/2023 1533   HGBUR LARGE (A) 07/25/2023 1533   BILIRUBINUR NEGATIVE 07/25/2023 1533   KETONESUR NEGATIVE 07/25/2023 1533   PROTEINUR 30 (A) 07/25/2023 1533   UROBILINOGEN 0.2 08/31/2011 1727   NITRITE NEGATIVE 07/25/2023 1533   LEUKOCYTESUR TRACE (A) 07/25/2023 1533   Sepsis Labs Recent Labs  Lab 07/27/23 0839 07/29/23 0740 07/30/23 0328 07/31/23 0315  WBC 8.5 11.7* 9.8 9.3   Microbiology Recent Results (from the past 240 hour(s))  Urine Culture     Status: Abnormal   Collection Time: 07/25/23  3:33 PM   Specimen: Urine, Random  Result Value Ref Range Status   Specimen Description URINE, RANDOM  Final   Special Requests   Final    NONE Reflexed from [id number] Performed at [facility], [address], [place], [place] [address]    Culture >=100,000 COLONIES/mL KLEBSIELLA PNEUMONIAE (A)  Final   Report Status 07/27/2023 FINAL  Final   Organism ID, Bacteria KLEBSIELLA PNEUMONIAE (A)  Final      Susceptibility   Klebsiella pneumoniae - MIC*    AMPICILLIN RESISTANT Resistant     CEFAZOLIN <=4 SENSITIVE Sensitive     CEFEPIME <=0.12 SENSITIVE Sensitive     CEFTRIAXONE <=0.25 SENSITIVE Sensitive     CIPROFLOXACIN <=0.25 SENSITIVE Sensitive     GENTAMICIN <=1 SENSITIVE Sensitive     IMIPENEM <=0.25 SENSITIVE Sensitive     NITROFURANTOIN 64 INTERMEDIATE Intermediate     TRIMETH/SULFA <=20 SENSITIVE Sensitive     AMPICILLIN/SULBACTAM <=2 SENSITIVE Sensitive     PIP/TAZO <=4 SENSITIVE Sensitive ug/mL    * >=100,000 COLONIES/mL KLEBSIELLA PNEUMONIAE  Culture, blood (Routine x 2)     Status: None   Collection Time: 07/25/23  3:44 PM   Specimen: BLOOD  Result Value Ref Range Status   Specimen Description BLOOD SITE NOT SPECIFIED  Final   Special Requests   Final    BOTTLES DRAWN AEROBIC AND ANAEROBIC Blood Culture adequate volume   Culture   Final    NO GROWTH 5 DAYS Performed at [facility], [address] [place], [place] [address]    Report Status 07/30/2023 FINAL  Final  Culture, blood (Routine x 2)     Status: None   Collection Time: 07/25/23  8:59 PM   Specimen: BLOOD  Result Value Ref Range Status   Specimen Description BLOOD SITE NOT SPECIFIED  Final   Special Requests   Final    BOTTLES DRAWN AEROBIC AND ANAEROBIC Blood Culture results may not be optimal due to an excessive volume of blood received in culture bottles   Culture   Final    NO GROWTH 5 DAYS Performed at [facility], [address], [place], [place] [address]    Report Status 07/30/2023 FINAL  Final  Surgical pcr screen     Status: Abnormal   Collection Time: 07/26/23 11:57 PM   Specimen: Nasal Mucosa; Nasal Swab  Result Value Ref Range Status   MRSA, PCR POSITIVE (A) NEGATIVE Final    Comment: RESULT CALLED TO, READ BACK BY AND VERIFIED WITH: A OPOKU,RN@0222  07/27/23 MK    Staphylococcus aureus POSITIVE (A) NEGATIVE Final  Comment: (NOTE) The Xpert SA Assay (FDA approved for NASAL specimens in patients 46 years of age and older), is one component of a comprehensive surveillance program. It is not intended to diagnose infection nor to guide or monitor treatment. Performed at [facility], [address], [place], [place] [address]      Time coordinating discharge: 35 minutes  SIGNED:   Glade Lloyd, MD  [facility] 07/31/2023, 9:53 AM

## 2023-07-31 NOTE — Progress Notes (Signed)
Inpatient Rehab Admissions Coordinator:  There is a bed available for pt in CIR today. Dr. Hanley Ben aware and in agreement. Pt, NSG and TOC made aware.   Wolfgang Phoenix, MS, CCC-SLP Admissions Coordinator [phone]

## 2023-07-31 NOTE — Progress Notes (Signed)
PHARMACY - ANTICOAGULATION CONSULT NOTE  Pharmacy Consult for Warfarin Indication: AVR replacement  No Known Allergies  Patient Measurements: Height: 5\' 10"  (177.8 cm) Weight: (!) 169.5 kg (373 lb 10.9 oz) IBW/kg (Calculated) : 73 Heparin Dosing Weight: 113.5  Vital Signs: Temp: 99.1 F (37.3 C) (11/02 2010) Temp Source: Oral (11/02 2010) BP: 119/78 (11/03 0436) Pulse Rate: 82 (11/03 0436)  Labs: Recent Labs    07/29/23 0740 07/30/23 0328 07/31/23 0315  HGB 6.6* 7.1* 7.4*  HCT 22.7* 24.4* 25.4*  PLT 425* 430* 475*  LABPROT 21.1* 19.1* 17.3*  INR 1.8* 1.6* 1.4*  CREATININE 0.79 0.87 0.75    Estimated Creatinine Clearance: 180.2 mL/min (by C-G formula based on SCr of 0.75 mg/dL).  Assessment: 47 yo male admitted for L BKA stump infection. Patient is on warfarin PTA for history of mechanical AVR.  PTA warfarin regimen: 5mg  MWF, 10mg  all other days.   Warfarin resumed 10/31 after BKA, held x 2 days  INR 1.4 is below goal range today. Plt 475. Hgb 7.4 improved   Goal of Therapy:  INR 2-3 Heparin level 0.3-0.7 units/ml Monitor platelets by anticoagulation protocol: Yes   Plan:  Warfarin 10 mg po x 1 dose today Monitor daily INR and CBC Start bridge therapy with Lovenox 150 mg subcutaneous bid until INR > 1.9 Continue to monitor H&H    Jeanella Cara, PharmD, [facility] Clinical Pharmacist Please see AMION for all Pharmacists' Contact Phone Numbers 07/31/2023, 7:16 AM

## 2023-07-31 NOTE — Progress Notes (Addendum)
Signed     Expand All Collapse All PMR Admission Coordinator Pre-Admission Assessment   Patient: Adam Long is an 47 y.o., male MRN: [id number] DOB: 08-18-1976 Height: 5\' 10"  (177.8 cm) Weight: (!) 169.5 kg                                                                                                                                                  Insurance Information HMO:     PPO:      PCP:      IPA:      80/20:      OTHER:  PRIMARY: [employer]      Policy#: [id number]      Subscriber: pt CM Name: Adam Long      Phone#: [phone]     Fax#: [phone] Pre-Cert#: [id number] Received approval on 07/30/23. Pt approved for 7 days beginning 07/29/23-08/05/23. Updates due on 08/05/23.     Employer:  Benefits:  Phone #: [phone]     Name: 11/1 Eff. Date: 05/29/2023     Deduct: none      Out of Pocket Max: none      Life Max: none  CIR: per medicaid      SNF: per medicaid Outpatient: per medicaid    Co-Pay:  Home Health: per medicaid      Co-Pay:  DME: per medicaid     Co-Pay:  Providers: in network  SECONDARY: none   Financial Counselor:       Phone#:    The Data processing manager" for patients in Inpatient Rehabilitation Facilities with attached "Privacy Act Statement-Health Care Records" was provided and verbally reviewed with: N/A   Emergency Contact Information Contact Information       Name Relation Home Work Mobile    [place] [phone]   [phone]    [place] Living [phone]   [phone]    Turner,Crystal Relative [phone]   [phone]         Other Contacts   None on File      Current Medical History  Patient Admitting Diagnosis: AKA   History of Present Illness: Adam Long is a 47 y.o. male with past medical history of hypertension, hyperlipidemia, CAD, s/p AVR on Coumadin, diabetes mellitus with A1c 10.2, PAD with history of left BKA.  Patient had multiple residual revisions in the past and  presented on 07/25/23 with purulent drainage and wound dehiscence of his left BKA.    Ultimately he required left AKA completed by Dr. Lajoyce Corners on 07/27/2023 with Prevena wound VAC applied.  He is on broad-spectrum IV antibiotics.  He is being treated for acute on chronic hyponatremia.  Patient has been evaluated by physical therapy and found to be mod assist for bed mobility and mod assist for lateral scoots along edge of bed.  Hemoglobin 6.6 today, blood  transfusion has been ordered.  Patient reports pain in his residual limb, overall controlled with medications.  He has had some muscle spasms in his left residual limb.     Patient[facility] has been reviewed by the rehabilitation admission coordinator and physician.   Past Medical History      Past Medical History:  Diagnosis Date   Anxiety     Aortic stenosis     Cellulitis and abscess of lower extremity 06/11/2019   Cellulitis of fourth toe of left foot     Cholelithiasis     Coronary artery disease      Nonobstructive CAD (40-50% LAD) 08/2019   Depression     Diabetes mellitus without complication (HCC)      Phreesia 09/27/2020   Elevated troponin level not due myocardial infarction 11/11/2019   Essential hypertension     Gangrene of toe of left foot (HCC) 07/06/2019   Heart murmur      Phreesia 09/27/2020   Hyperlipidemia      Phreesia 09/27/2020   Hypertension      Phreesia 09/27/2020   Mixed hyperlipidemia     Morbid obesity (HCC)     S/P aortic valve replacement with mechanical valve 12/05/2019    25 mm Carbomedics top hat bileaflet mechanical valve via partial upper hemi-sternotomy   Severe aortic stenosis 09/24/2019   Type 2 diabetes mellitus (HCC)          Has the patient had major surgery during 100 days prior to admission? Yes   Family History  family history includes Breast cancer in his paternal grandmother; COPD in his paternal grandfather; Cancer in his mother; Colon cancer (age  of onset: 72) in his maternal uncle; Heart attack in his maternal aunt and maternal grandmother; Heart disease in his father and paternal uncle; Heart murmur in his sister; Hyperlipidemia in his father; Hypertension in his father; Liver disease in his maternal grandfather; Pancreatic cancer in his paternal aunt; Stroke in his father.     Current Medications   Current Medications    Current Facility-Administered Medications:    0.9 %  sodium chloride infusion (Manually program via Guardrails IV Fluids), , Intravenous, Once, Alekh, Kshitiz, MD, Stopped at 07/30/23 1843   acetaminophen (TYLENOL) tablet 325-650 mg, 325-650 mg, Oral, Q6H PRN, Nadara Mustard, MD, 650 mg at 07/28/23 1332   ascorbic acid (VITAMIN C) tablet 1,000 mg, 1,000 mg, Oral, Daily, Nadara Mustard, MD, 1,000 mg at 07/31/23 0847   enoxaparin (LOVENOX) injection 150 mg, 150 mg, Subcutaneous, Q12H, Alekh, Kshitiz, MD, 150 mg at 07/31/23 0902   ezetimibe (ZETIA) tablet 10 mg, 10 mg, Oral, Daily, Nadara Mustard, MD, 10 mg at 07/31/23 0846   gabapentin (NEURONTIN) capsule 600 mg, 600 mg, Oral, BID, Nadara Mustard, MD, 600 mg at 07/31/23 0846   guaiFENesin-dextromethorphan (ROBITUSSIN DM) 100-10 MG/5ML syrup 15 mL, 15 mL, Oral, Q4H PRN, Nadara Mustard, MD   hydrALAZINE (APRESOLINE) injection 5 mg, 5 mg, Intravenous, Q20 Min PRN, Nadara Mustard, MD   HYDROmorphone (DILAUDID) injection 0.5-1 mg, 0.5-1 mg, Intravenous, Q4H PRN, Nadara Mustard, MD   insulin aspart (novoLOG) injection 0-20 Units, 0-20 Units, Subcutaneous, TID WC, Alekh, Kshitiz, MD   insulin aspart (novoLOG) injection 0-5 Units, 0-5 Units, Subcutaneous, QHS, Nadara Mustard, MD, 4 Units at 07/30/23 2213   insulin glargine-yfgn (SEMGLEE) injection 35 Units, 35 Units, Subcutaneous, Daily, Glade Lloyd, MD, 35 Units at 07/31/23 0902   labetalol (  NORMODYNE) injection 10 mg, 10 mg, Intravenous, Q10 min PRN, Nadara Mustard, MD   lisinopril (ZESTRIL) tablet 20 mg, 20 mg, Oral,  Daily, Nadara Mustard, MD, 20 mg at 07/31/23 0847   magnesium sulfate IVPB 2 g 50 mL, 2 g, Intravenous, Daily PRN, Nadara Mustard, MD   methocarbamol (ROBAXIN) tablet 500 mg, 500 mg, Oral, Q6H PRN, Hanley Ben, Kshitiz, MD, 500 mg at 07/29/23 1813   metoprolol tartrate (LOPRESSOR) injection 2-5 mg, 2-5 mg, Intravenous, Q2H PRN, Nadara Mustard, MD   metoprolol tartrate (LOPRESSOR) tablet 25 mg, 25 mg, Oral, BID, Nadara Mustard, MD, 25 mg at 07/31/23 0847   mupirocin cream (BACTROBAN) 2 %, , Topical, BID, Nadara Mustard, MD, Given at 07/31/23 0845   nutrition supplement (JUVEN) (JUVEN) powder packet 1 packet, 1 packet, Oral, BID BM, Nadara Mustard, MD, 1 packet at 07/31/23 0853   ondansetron (ZOFRAN) injection 4 mg, 4 mg, Intravenous, Q6H PRN, Nadara Mustard, MD   oxyCODONE (Oxy IR/ROXICODONE) immediate release tablet 10-15 mg, 10-15 mg, Oral, Q4H PRN, Nadara Mustard, MD, 15 mg at 07/31/23 0247   oxyCODONE (Oxy IR/ROXICODONE) immediate release tablet 5-10 mg, 5-10 mg, Oral, Q4H PRN, Nadara Mustard, MD, 10 mg at 07/30/23 0946   pantoprazole (PROTONIX) EC tablet 40 mg, 40 mg, Oral, Daily, Nadara Mustard, MD, 40 mg at 07/31/23 0847   phenol (CHLORASEPTIC) mouth spray 1 spray, 1 spray, Mouth/Throat, PRN, Nadara Mustard, MD   potassium chloride SA (KLOR-CON M) CR tablet 20-40 mEq, 20-40 mEq, Oral, Daily PRN, Nadara Mustard, MD   sertraline (ZOLOFT) tablet 50 mg, 50 mg, Oral, Daily, Nadara Mustard, MD, 50 mg at 07/31/23 0847   simethicone (MYLICON) chewable tablet 80 mg, 80 mg, Oral, Q6H PRN, Nadara Mustard, MD, 80 mg at 07/27/23 9528   warfarin (COUMADIN) tablet 10 mg, 10 mg, Oral, ONCE-1600, Glade Lloyd, MD   Warfarin - Pharmacist Dosing Inpatient, , Does not apply, q1600, Glade Lloyd, MD, Given at 07/30/23 1844   zinc sulfate capsule 220 mg, 220 mg, Oral, Daily, Nadara Mustard, MD, 220 mg at 07/31/23 0847     Patients Current Diet:  Diet Order                  Diet Carb Modified Fluid consistency:  Thin; Room service appropriate? Yes  Diet effective now                         Precautions / Restrictions Precautions Precautions: Fall, Other (comment) Precaution Comments: wound vac L residual limb Restrictions Weight Bearing Restrictions: Yes LLE Weight Bearing: Non weight bearing    Has the patient had 2 or more falls or a fall with injury in the past year?No   Prior Activity Level Community (5-7x/wk): Mod I with prosthesis without AD, used RW or w/c when not using prosthesis   Prior Functional Level Prior Function Prior Level of Function : Independent/Modified Independent, Working/employed, Driving Mobility Comments: Amb with L prosthesis and no AD. Uses RW or w/c when prosthesis not donned. ADLs Comments: mod I ADLs. Worked part time in Agricultural consultant.   Self Care: Did the patient need help bathing, dressing, using the toilet or eating?  Independent   Indoor Mobility: Did the patient need assistance with walking from room to room (with or without device)? Independent   Stairs: Did the patient need assistance with internal or external stairs (with or  without device)? Independent   Functional Cognition: Did the patient need help planning regular tasks such as shopping or remembering to take medications? Independent   Patient Information Are you of Hispanic, Latino/a,or Spanish origin?: A. No, not of Hispanic, Latino/a, or Spanish origin What is your race?: A. White Do you need or want an interpreter to communicate with a doctor or health care staff?: 0. No   Patient's Response To:  Health Literacy and Transportation Is the patient able to respond to health literacy and transportation needs?: Yes Health Literacy - How often do you need to have someone help you when you read instructions, pamphlets, or other written material from your doctor or pharmacy?: Never In the past 12 months, has lack of transportation kept you from medical appointments or from getting  medications?: No In the past 12 months, has lack of transportation kept you from meetings, work, or from getting things needed for daily living?: No   Home Assistive Devices / Equipment Home Equipment: Agricultural consultant (2 wheels), BSC/3in1, Wheelchair - manual, Tub bench   Prior Device Use: Indicate devices/aids used by the patient prior to current illness, exacerbation or injury? Manual wheelchair, Walker, and Orthotics/Prosthetics   Current Functional Level Cognition   Overall Cognitive Status: Within Functional Limits for tasks assessed Orientation Level: Oriented X4    Extremity Assessment (includes Sensation/Coordination)   Upper Extremity Assessment: Overall WFL for tasks assessed  Lower Extremity Assessment: Defer to PT evaluation LLE Deficits / Details: s/p AKA     ADLs   Overall ADL's : Needs assistance/impaired Eating/Feeding: Independent, Bed level Grooming: Wash/dry hands, Wash/dry face, Contact guard assist, Sitting Upper Body Bathing: Contact guard assist, Sitting Lower Body Bathing: Maximal assistance, Sitting/lateral leans, Bed level Upper Body Dressing : Contact guard assist, Sitting Lower Body Dressing: Moderate assistance, Maximal assistance, Sitting/lateral leans, Bed level     Mobility   Overal bed mobility: Needs Assistance Bed Mobility: Supine to Sit, Sit to Supine Supine to sit: Mod assist, Used rails, HOB elevated Sit to supine: Mod assist General bed mobility comments: assist with trunk and RLE, increased time, heavy use of bed rail     Transfers   Overall transfer level: Needs assistance General transfer comment: lateral scoots along EOB mod assist. Deferred OOB due to pain.     Ambulation / Gait / Stairs / Clinical biochemist / Balance Balance Overall balance assessment: Needs assistance Sitting-balance support: Feet supported, Single extremity supported Sitting balance-Leahy Scale: Fair     Special needs/care consideration       Previous Home Environment  Living Arrangements: Other relatives  Lives With: Other (Comment) Available Help at Discharge: Family, Friend(s), Available PRN/intermittently Type of Home: House Home Layout: Two level, Able to live on main level with bedroom/bathroom Alternate Level Stairs-Rails: Right, Left, Can reach both Alternate Level Stairs-Number of Steps: flight Home Access: Stairs to enter Entrance Stairs-Rails: None Entrance Stairs-Number of Steps: 2 short steps Bathroom Shower/Tub: Engineer, manufacturing systems: Handicapped height Bathroom Accessibility: Yes How Accessible: Accessible via walker Home Care Services: No Additional Comments: uncle, cousin also helps pt   Discharge Living Setting Plans for Discharge Living Setting: Lives with (comment) (other relatives) Type of Home at Discharge: House Discharge Home Layout: Two level, Able to live on main level with bedroom/bathroom Alternate Level Stairs-Rails: Right, Left, Can reach both Alternate Level Stairs-Number of Steps: flight Discharge Home Access: Stairs to enter Entrance Stairs-Rails: None Entrance Stairs-Number of Steps: 2  short steps Discharge Bathroom Shower/Tub: Tub/shower unit Discharge Bathroom Toilet: Handicapped height Discharge Bathroom Accessibility: Yes How Accessible: Accessible via walker Does the patient have any problems obtaining your medications?: No   Social/Family/Support Systems Patient 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 Anticipated Caregiver: family Anticipated Industrial/product designer Information: see contacts, Uncle and cousin Ability/Limitations of Caregiver: prn only Caregiver Availability: Intermittent Discharge Plan Discussed with Primary Caregiver: Yes Is Caregiver In Agreement with Plan?: Yes Does Caregiver/Family have Issues with Lodging/Transportation while Pt is in Rehab?: No   Goals Patient/Family Goal for Rehab: Mod I with PT and OT Expected length of  stay: ELOS 10 to 12 days Pt/Family Agrees to Admission and willing to participate: Yes Program Orientation Provided & Reviewed with Pt/Caregiver Including Roles  & Responsibilities: Yes   Decrease burden of Care through IP rehab admission: n/a   Possible need for SNF placement upon discharge:not anticipated   Patient Condition: This patient's medical and functional status has remained the same since  the consult dated: 07/29/23 in which the Rehabilitation Physician determined and documented that the patient's condition is appropriate for intensive rehabilitative care in an inpatient rehabilitation facility. There are no functional changes. Patient's medical and functional status update has been discussed with the Rehabilitation physician and patient remains appropriate for inpatient rehabilitation. Will admit to inpatient rehab today.   Preadmission Screen Completed By:  Clois Dupes, RN MSN with updates by Wolfgang Phoenix 07/31/2023 9:37 AM ______________________________________________________________________   Discussed status with Dr. Berline Chough on 07/31/23 at 9:37 AM and received approval for admission today.   Admission Coordinator:  Clois Dupes RN MSN time 9:37 AM/Date 07/31/23

## 2023-07-31 NOTE — H&P (Signed)
Physical Medicine and Rehabilitation Admission H&P    Chief Complaint  Patient presents with   Wound Infection  : HPI: Adam Long is a 47 y.o. male with past medical history of hypertension, hyperlipidemia, CAD, s/p AVR on Coumadin, diabetes mellitus with A1c 10.2, PAD with history of left BKA, anxiety, depression, and morbid obesity.  Patient had multiple residual revisions in the past (last revision May 2024 due to dehiscence), presented to the ED on 07/25/23 with purulent drainage and wound dehiscence of his left BKA. Xray showed diffuse soft tissue swelling c/w cellulitis but no acute osteomyelitis, labs without leukocytosis, Hgb 8.1 near baseline, sodium 128 (baseline 130s), glucose 293, lactic WNL x2, Urine with ?UTI vs klebsiella pneumoniae colonization treated with IV abx x4 days, Blood Cx neg x2.  Ultimately he required left AKA completed by Dr. Lajoyce Corners on 07/27/2023 with Prevena wound VAC applied.  He completed 4 days of rocephin and 3 days of flagyl, d/c'd abx on 07/28/23.   Treated for acute on chronic hyponatremia. Hemoglobin 6.6 on 07/29/23, transfused 1U PRBC, up to 7.4 on 07/31/23, to be transfused again if <7. INR subtherapeutic, being bridged with lovenox.   Patient has been evaluated by physical therapy and found to be mod assist for bed mobility and mod assist for lateral scoots along edge of bed.  He has been seen by Occupational Therapy and he requires mod assist for lower body dressing, max assist for lower body bathing. The patient requires inpatient physical medicine and rehabilitation evaluations and treatment secondary to L AKA.    PTA patient lived with his uncle, modified independent for mobility with his left below the knee amputation prosthetic device.  Patient lives in a two-story home but only uses the first floor.  2 steps to enter.  His uncle can assist him after discharge.  ROS Past Medical History:  Diagnosis Date   Anxiety    Aortic stenosis     Cellulitis and abscess of lower extremity 06/11/2019   Cellulitis of fourth toe of left foot    Cholelithiasis    Coronary artery disease    Nonobstructive CAD (40-50% LAD) 08/2019   Depression    Diabetes mellitus without complication (HCC)    Phreesia 09/27/2020   Elevated troponin level not due myocardial infarction 11/11/2019   Essential hypertension    Gangrene of toe of left foot (HCC) 07/06/2019   Heart murmur    Phreesia 09/27/2020   Hyperlipidemia    Phreesia 09/27/2020   Hypertension    Phreesia 09/27/2020   Mixed hyperlipidemia    Morbid obesity (HCC)    S/P aortic valve replacement with mechanical valve 12/05/2019   25 mm Carbomedics top hat bileaflet mechanical valve via partial upper hemi-sternotomy   Severe aortic stenosis 09/24/2019   Type 2 diabetes mellitus (HCC)    Past Surgical History:  Procedure Laterality Date   ABDOMINAL AORTOGRAM W/LOWER EXTREMITY N/A 07/06/2019   Procedure: ABDOMINAL AORTOGRAM W/LOWER EXTREMITY;  Surgeon: Sherren Kerns, MD;  Location: [facility] INVASIVE CV LAB;  Service: Cardiovascular;  Laterality: N/A;  Bilateral   AMPUTATION Left 07/09/2019   Procedure: LEFT FOURTH and Fifth TOE AMPUTATION.;  Surgeon: Larina Earthly, MD;  Location: [facility] OR;  Service: Vascular;  Laterality: Left;   AMPUTATION Left 03/11/2021   Procedure: LEFT FOOT 5TH  AND 4TH RAY AMPUTATION;  Surgeon: Nadara Mustard, MD;  Location: [facility] OR;  Service: Orthopedics;  Laterality: Left;   AMPUTATION Left 08/28/2021   Procedure: AMPUTATION  BELOW KNEE;  Surgeon: Nadara Mustard, MD;  Location: [facility] OR;  Service: Orthopedics;  Laterality: Left;   AMPUTATION Left 01/29/2023   Procedure: LEFT AMPUTATION BELOW KNEE REVISION AND CLOSURE;  Surgeon: Nadara Mustard, MD;  Location: [facility] OR;  Service: Orthopedics;  Laterality: Left;   AMPUTATION Left 07/27/2023   Procedure: LEFT ABOVE KNEE AMPUTATION;  Surgeon: Nadara Mustard, MD;  Location: [facility] OR;  Service: Orthopedics;  Laterality: Left;   AORTIC  VALVE REPLACEMENT N/A 12/05/2019   Procedure: PARTIAL STERNOTOMY FOR AORTIC VALVE REPLACEMENT (AVR), USING CARBOMEDICS SUPRA-ANNULAR TOP HAT ;  Surgeon: Purcell Nails, MD;  Location: [facility] OR;  Service: Open Heart Surgery;  Laterality: N/A;  No neck lines on left   APPLICATION OF WOUND VAC Left 10/30/2021   Procedure: APPLICATION OF WOUND VAC;  Surgeon: Nadara Mustard, MD;  Location: [facility] OR;  Service: Orthopedics;  Laterality: Left;   APPLICATION OF WOUND VAC Left 12/25/2021   Procedure: APPLICATION OF WOUND VAC;  Surgeon: Nadara Mustard, MD;  Location: [facility] OR;  Service: Orthopedics;  Laterality: Left;   APPLICATION OF WOUND VAC Left 01/26/2023   Procedure: APPLICATION OF WOUND VAC TO LEFT STUMP;  Surgeon: Tarry Kos, MD;  Location: [facility] OR;  Service: Orthopedics;  Laterality: Left;   CARDIAC VALVE REPLACEMENT N/A    Phreesia 09/27/2020   I & D EXTREMITY Left 01/26/2023   Procedure: IRRIGATION AND DEBRIDEMENT OF LEFT BKA STUMP;  Surgeon: Tarry Kos, MD;  Location: [facility] OR;  Service: Orthopedics;  Laterality: Left;   IR RADIOLOGY PERIPHERAL GUIDED IV START  10/05/2019   IR US GUIDE VASC ACCESS RIGHT  10/05/2019   MULTIPLE EXTRACTIONS WITH ALVEOLOPLASTY N/A 10/26/2019   Procedure: EXTRACTION OF TOOTH #'S 3, 5-11,19-28,  AND 32 WITH ALVEOLOPLASTY;  Surgeon: Charlynne Pander, DDS;  Location: [facility] OR;  Service: Oral Surgery;  Laterality: N/A;   RIGHT HEART CATH AND CORONARY ANGIOGRAPHY N/A 09/24/2019   Procedure: RIGHT HEART CATH AND CORONARY ANGIOGRAPHY;  Surgeon: Tonny Bollman, MD;  Location: [facility] INVASIVE CV LAB;  Service: Cardiovascular;  Laterality: N/A;   STUMP REVISION Left 10/30/2021   Procedure: REVISION LEFT BELOW KNEE AMPUTATION;  Surgeon: Nadara Mustard, MD;  Location: [facility] OR;  Service: Orthopedics;  Laterality: Left;   STUMP REVISION Left 12/25/2021   Procedure: REVISION LEFT BELOW KNEE AMPUTATION;  Surgeon: Nadara Mustard, MD;  Location: [facility] OR;  Service: Orthopedics;  Laterality: Left;   TEE  WITHOUT CARDIOVERSION N/A 12/05/2019   Procedure: TRANSESOPHAGEAL ECHOCARDIOGRAM (TEE);  Surgeon: Purcell Nails, MD;  Location: [facility] OR;  Service: Open Heart Surgery;  Laterality: N/A;   TEE WITHOUT CARDIOVERSION N/A 09/01/2021   Procedure: TRANSESOPHAGEAL ECHOCARDIOGRAM (TEE);  Surgeon: Little Ishikawa, MD;  Location: [facility] ENDOSCOPY;  Service: Cardiovascular;  Laterality: N/A;   Family History  Problem Relation Age of Onset   Cancer Mother        Brain tumor   Heart disease Father    Hyperlipidemia Father    Hypertension Father    Stroke Father    Heart murmur Sister    Heart attack Maternal Grandmother    Liver disease Maternal Grandfather    Breast cancer Paternal Grandmother    COPD Paternal Grandfather    Heart attack Maternal Aunt    Colon cancer Maternal Uncle 67   Pancreatic cancer Paternal Aunt    Heart disease Paternal Uncle    Prostate cancer Neg Hx    Social History:  reports  that he has never smoked. He quit smokeless tobacco use about 3 years ago.  His smokeless tobacco use included chew. He reports current alcohol use of about 4.0 standard drinks of alcohol per week. He reports that he does not use drugs. Allergies: No Known Allergies Medications Prior to Admission  Medication Sig Dispense Refill   Continuous Glucose Sensor (FREESTYLE LIBRE 3 SENSOR) MISC Place 1 sensor on the skin every 14 days. Use to check glucose continuously 6 each 6   ezetimibe (ZETIA) 10 MG tablet Take 1 tablet (10 mg total) by mouth daily. 90 tablet 3   gabapentin (NEURONTIN) 300 MG capsule Take 2 capsules (600 mg total) by mouth 2 (two) times daily. 360 capsule 3   lisinopril (ZESTRIL) 20 MG tablet Take 1 tablet (20 mg total) by mouth daily. 90 tablet 3   metFORMIN (GLUCOPHAGE) 500 MG tablet Take 2 tablets (1,000 mg total) by mouth 2 (two) times daily with a meal. 360 tablet 3   metoprolol tartrate (LOPRESSOR) 25 MG tablet Take 1 tablet (25 mg total) by mouth 2 (two) times daily. 180  tablet 3   pantoprazole (PROTONIX) 40 MG tablet Take 1 tablet (40 mg total) by mouth daily. 90 tablet 3   sertraline (ZOLOFT) 50 MG tablet Take 1 tablet (50 mg total) by mouth daily. 90 tablet 3   warfarin (COUMADIN) 10 MG tablet TAKE 1 TABLET BY MOUTH DAILY EXCEPT TAKE  ONE-HALF TABLET  ON MONDAYS, WEDNESDAYS AND FRIDAYS OR AS DIRECTED (Patient taking differently: Take 5-10 mg by mouth See admin instructions. TAKE 1 TABLET BY MOUTH DAILY EXCEPT TAKE  ONE-HALF TABLET  ON MONDAYS, WEDNESDAYS AND FRIDAYS OR AS DIRECTED) 30 tablet 5   Zinc Sulfate 220 (50 Zn) MG TABS Take 1 tablet (220 mg total) by mouth daily. 100 tablet 0   [DISCONTINUED] insulin glargine (LANTUS) 100 UNIT/ML Solostar Pen Inject 30 Units into the skin daily. 15 mL 11   Insulin Pen Needle 32G X 4 MM MISC Use with Lantus flexpen 100 each 0      Home: Home Living Family/patient expects to be discharged to:: Private residence Living Arrangements: Other relatives Available Help at Discharge: Family, Friend(s), Available PRN/intermittently Type of Home: House Home Access: Stairs to enter Secretary/administrator of Steps: 2 short steps Entrance Stairs-Rails: None Home Layout: Two level, Able to live on main level with bedroom/bathroom Alternate Level Stairs-Number of Steps: flight Alternate Level Stairs-Rails: Right, Left, Can reach both Bathroom Shower/Tub: Tub/shower unit Bathroom Toilet: Handicapped height Bathroom Accessibility: Yes Home Equipment: Agricultural consultant (2 wheels), BSC/3in1, Wheelchair - manual, Tub bench Additional Comments: uncle, cousin also helps pt  Lives With: Other (Comment)   Functional History: Prior Function Prior Level of Function : Independent/Modified Independent, Working/employed, Driving Mobility Comments: Amb with L prosthesis and no AD. Uses RW or w/c when prosthesis not donned. ADLs Comments: mod I ADLs. Worked part time in [employer].  Functional Status:  Mobility: Bed  Mobility Overal bed mobility: Needs Assistance Bed Mobility: Supine to Sit, Sit to Supine Supine to sit: Mod assist, Used rails, HOB elevated Sit to supine: Mod assist General bed mobility comments: assist with trunk and RLE, increased time, heavy use of bed rail Transfers Overall transfer level: Needs assistance General transfer comment: lateral scoots along EOB mod assist. Deferred OOB due to pain.      ADL: ADL Overall ADL's : Needs assistance/impaired Eating/Feeding: Independent, Bed level Grooming: Wash/dry hands, Wash/dry face, Contact guard assist, Sitting Upper Body Bathing:  Contact guard assist, Sitting Lower Body Bathing: Maximal assistance, Sitting/lateral leans, Bed level Upper Body Dressing : Contact guard assist, Sitting Lower Body Dressing: Moderate assistance, Maximal assistance, Sitting/lateral leans, Bed level  Cognition: Cognition Overall Cognitive Status: Within Functional Limits for tasks assessed Orientation Level: Oriented X4 Cognition Arousal: Alert Behavior During Therapy: WFL for tasks assessed/performed Overall Cognitive Status: Within Functional Limits for tasks assessed  Physical Exam: Blood pressure 132/69, pulse 78, temperature 98 F (36.7 C), temperature source Oral, resp. rate 17, height 5\' 10"  (1.778 m), weight (!) 169.5 kg, SpO2 98%. Physical Exam  Results for orders placed or performed during the hospital encounter of 07/25/23 (from the past 48 hour(s))  Glucose, capillary     Status: Abnormal   Collection Time: 07/29/23 11:55 AM  Result Value Ref Range   Glucose-Capillary 266 (H) 70 - 99 mg/dL    Comment: Glucose reference range applies only to samples taken after fasting for at least 8 hours.  Glucose, capillary     Status: Abnormal   Collection Time: 07/29/23  4:39 PM  Result Value Ref Range   Glucose-Capillary 179 (H) 70 - 99 mg/dL    Comment: Glucose reference range applies only to samples taken after fasting for at least 8 hours.   Glucose, capillary     Status: Abnormal   Collection Time: 07/29/23  9:41 PM  Result Value Ref Range   Glucose-Capillary 156 (H) 70 - 99 mg/dL    Comment: Glucose reference range applies only to samples taken after fasting for at least 8 hours.  Protime-INR     Status: Abnormal   Collection Time: 07/30/23  3:28 AM  Result Value Ref Range   Prothrombin Time 19.1 (H) 11.4 - 15.2 seconds   INR 1.6 (H) 0.8 - 1.2    Comment: (NOTE) INR goal varies based on device and disease states. Performed at [facility] Lab, [address], [place], [place] [address]   CBC with Differential/Platelet     Status: Abnormal   Collection Time: 07/30/23  3:28 AM  Result Value Ref Range   WBC 9.8 4.0 - 10.5 K/uL   RBC 3.33 (L) 4.22 - 5.81 MIL/uL   Hemoglobin 7.1 (L) 13.0 - 17.0 g/dL    Comment: Reticulocyte Hemoglobin testing may be clinically indicated, consider ordering this additional test [id number]    HCT 24.4 (L) 39.0 - 52.0 %   MCV 73.3 (L) 80.0 - 100.0 fL   MCH 21.3 (L) 26.0 - 34.0 pg   MCHC 29.1 (L) 30.0 - 36.0 g/dL   RDW 62.8 (H) 31.5 - 17.6 %   Platelets 430 (H) 150 - 400 K/uL   nRBC 0.9 (H) 0.0 - 0.2 %   Neutrophils Relative % 61 %   Neutro Abs 6.1 1.7 - 7.7 K/uL   Lymphocytes Relative 22 %   Lymphs Abs 2.1 0.7 - 4.0 K/uL   Monocytes Relative 9 %   Monocytes Absolute 0.9 0.1 - 1.0 K/uL   Eosinophils Relative 2 %   Eosinophils Absolute 0.2 0.0 - 0.5 K/uL   Basophils Relative 1 %   Basophils Absolute 0.1 0.0 - 0.1 K/uL   Immature Granulocytes 5 %   Abs Immature Granulocytes 0.45 (H) 0.00 - 0.07 K/uL    Comment: Performed at [facility] Lab, [address], [place], [place] [address]  Basic metabolic panel     Status: Abnormal   Collection Time: 07/30/23  3:28 AM  Result Value Ref Range   Sodium  131 (L) 135 - 145 mmol/L   Potassium 4.4 3.5 - 5.1 mmol/L   Chloride 101 98 - 111 mmol/L   CO2 25 22 - 32 mmol/L   Glucose, Bld 220 (H) 70 - 99 mg/dL    Comment: Glucose reference  range applies only to samples taken after fasting for at least 8 hours.   BUN 15 6 - 20 mg/dL   Creatinine, Ser 4.33 0.61 - 1.24 mg/dL   Calcium 8.2 (L) 8.9 - 10.3 mg/dL   GFR, Estimated >29 >51 mL/min    Comment: (NOTE) Calculated using the CKD-EPI Creatinine Equation (2021)    Anion gap 5 5 - 15    Comment: Performed at [facility], [address], [place], [place] [address]  Magnesium     Status: None   Collection Time: 07/30/23  3:28 AM  Result Value Ref Range   Magnesium 1.8 1.7 - 2.4 mg/dL    Comment: Performed at [facility], [address], [place], [place] [address]  Glucose, capillary     Status: Abnormal   Collection Time: 07/30/23  8:28 AM  Result Value Ref Range   Glucose-Capillary 198 (H) 70 - 99 mg/dL    Comment: Glucose reference range applies only to samples taken after fasting for at least 8 hours.  Glucose, capillary     Status: Abnormal   Collection Time: 07/30/23 11:58 AM  Result Value Ref Range   Glucose-Capillary 237 (H) 70 - 99 mg/dL    Comment: Glucose reference range applies only to samples taken after fasting for at least 8 hours.  Glucose, capillary     Status: Abnormal   Collection Time: 07/30/23  5:10 PM  Result Value Ref Range   Glucose-Capillary 238 (H) 70 - 99 mg/dL    Comment: Glucose reference range applies only to samples taken after fasting for at least 8 hours.  Glucose, capillary     Status: Abnormal   Collection Time: 07/30/23  9:48 PM  Result Value Ref Range   Glucose-Capillary 318 (H) 70 - 99 mg/dL    Comment: Glucose reference range applies only to samples taken after fasting for at least 8 hours.  Protime-INR     Status: Abnormal   Collection Time: 07/31/23  3:15 AM  Result Value Ref Range   Prothrombin Time 17.3 (H) 11.4 - 15.2 seconds   INR 1.4 (H) 0.8 - 1.2    Comment: (NOTE) INR goal varies based on device and disease states. Performed at [facility], [address], [place], [place] [address]   CBC      Status: Abnormal   Collection Time: 07/31/23  3:15 AM  Result Value Ref Range   WBC 9.3 4.0 - 10.5 K/uL   RBC 3.50 (L) 4.22 - 5.81 MIL/uL   Hemoglobin 7.4 (L) 13.0 - 17.0 g/dL    Comment: Reticulocyte Hemoglobin testing may be clinically indicated, consider ordering this additional test [id number]    HCT 25.4 (L) 39.0 - 52.0 %   MCV 72.6 (L) 80.0 - 100.0 fL   MCH 21.1 (L) 26.0 - 34.0 pg   MCHC 29.1 (L) 30.0 - 36.0 g/dL   RDW 22.0 (H) 25.4 - 27.0 %   Platelets 475 (H) 150 - 400 K/uL   nRBC 0.9 (H) 0.0 - 0.2 %    Comment: Performed at [facility], [address], [place], [place] [address]  Basic metabolic panel     Status: Abnormal  Collection Time: 07/31/23  3:15 AM  Result Value Ref Range   Sodium 132 (L) 135 - 145 mmol/L   Potassium 4.1 3.5 - 5.1 mmol/L   Chloride 99 98 - 111 mmol/L   CO2 24 22 - 32 mmol/L   Glucose, Bld 289 (H) 70 - 99 mg/dL    Comment: Glucose reference range applies only to samples taken after fasting for at least 8 hours.   BUN 18 6 - 20 mg/dL   Creatinine, Ser 5.78 0.61 - 1.24 mg/dL   Calcium 8.8 (L) 8.9 - 10.3 mg/dL   GFR, Estimated >46 >96 mL/min    Comment: (NOTE) Calculated using the CKD-EPI Creatinine Equation (2021)    Anion gap 9 5 - 15    Comment: Performed at [facility], [address], [place], [place] [address]  Magnesium     Status: None   Collection Time: 07/31/23  3:15 AM  Result Value Ref Range   Magnesium 1.7 1.7 - 2.4 mg/dL    Comment: Performed at [facility], [address] [place], [place] [address]  Glucose, capillary     Status: Abnormal   Collection Time: 07/31/23  8:18 AM  Result Value Ref Range   Glucose-Capillary 281 (H) 70 - 99 mg/dL    Comment: Glucose reference range applies only to samples taken after fasting for at least 8 hours.   No results found.    Blood pressure 132/69, pulse 78, temperature 98 F (36.7 C), temperature source Oral, resp. rate 17, height 5\' 10"  (1.778 m), weight (!)  169.5 kg, SpO2 98%.  Medical Problem List and Plan: 1. Functional deficits secondary to infected L BKA s/p AKA on 07/27/23   -patient may not shower until VAC removed  -ELOS/Goals: 10-12 days- supervision to min A at w/c level 2.  Antithrombotics: -DVT/anticoagulation:  Pharmaceutical: Lovenox 150 mg BID until Coumadin/INR therapeutic  -antiplatelet therapy: n/a 3. Pain Management: chronic pain and neuropathy-  Con't tylenol prn, Oxycodone 5-15 mg q4 hours prn and gabapentin  scheduled- as well as Robaxin prn 4. Mood/Behavior/Sleep/Anxiety/Depression:   -Continue sertraline 50mg  daily and Gabapentin 600mg  BID  Also team milieu to help with mood and anxiety -antipsychotic agents: n/a 5. Neuropsych/cognition: This patient is capable of making decisions on his own behalf. 6. Skin/Wound Care: has VAC -for 7 days from 07/27/23 7. Fluids/Electrolytes/Nutrition/chronic hyponatremia: current Na 132- will recheck in AM  -Continue vitamins and supplements 8. Diabetes, poorly controlled, Hgb A1C 10.2: SSI, increased semglee to 35U on d/c from acute, metformin to be resumed upon admission to CIR 500 mg BID-AC 9. Acute on chronic anemia: Hgb 7.4 after 1U PRBCs on 11/1, transfuse if <7 10. HTN: continue metoprolol 25mg  BID and lisinopril 20mg  daily 11. HLD: continue zetia 10mg  daily 12. CAD and mechanical aortic valve: on coumadin resumed on 07/28/23, pharmacy following, bridging with lovenox 13. GERD: continue protonix 40mg  daily 14. Morbid obesity: provide dietary education 15. Thrombocytosis: likely reactive, recheck labs in AM and monitor 16. MRSA positive nasal swab: mupirocin BID 17. L BKA stump infection- done with IV ABX since amputation removed infection. .  18. Microscopic hematuria- has nonobstructive renal stones- monitor clinically.  19. GERD_ con't PPI   [address] PA-C 07/31/2023    I have personally performed a face to face diagnostic evaluation of this patient and  formulated the key components of the plan.  Additionally, I have personally reviewed laboratory data, imaging studies, as well as relevant notes and concur  with the physician assistant's documentation above.   The patient's status has not changed from the original H&P.  Any changes in documentation from the acute care chart have been noted above.

## 2023-07-31 NOTE — Progress Notes (Signed)
Signed              Physical Medicine and Rehabilitation Consult Reason for Consult:Rehab Referring Physician: Dr. Hanley Ben     HPI: Adam Long is a 47 y.o. male with past medical history of hypertension, hyperlipidemia, CAD, s/p AVR on Coumadin, diabetes mellitus with A1c 10.2, PAD with history of left BKA.  Patient had multiple residual revisions in the past and presented with purulent drainage and wound dehiscence of his left BKA.  Ultimately he required left AKA completed by Dr. Eual Fines on 07/27/2023 with Prevena wound VAC applied.  He is on broad-spectrum IV antibiotics.  He is being treated for acute on chronic hyponatremia.  Patient has been evaluated by physical therapy and found to be mod assist for bed mobility and mod assist for lateral scoots along edge of bed.  Hemoglobin 6.6 today, blood transfusion has been ordered.  Patient reports pain in his residual limb, overall controlled with medications.  He has had some muscle spasms in his left residual limb.  He has been seen by Occupational Therapy and he requires mod assist for lower body dressing, max assist for lower body bathing.   PTA patient left with his uncle, modified independent for mobility with his left below the knee amputation prosthetic device.  Patient lives in a two-story home but only uses the first floor.  2 steps to enter.  His uncle can assist him after discharge.   Review of Systems  Constitutional:  Positive for malaise/fatigue. Negative for chills and fever.  HENT:  Positive for tinnitus. Negative for congestion.   Eyes:  Negative for blurred vision and double vision.  Respiratory:  Negative for shortness of breath.   Cardiovascular:  Negative for chest pain.  Gastrointestinal:  Negative for abdominal pain, constipation, diarrhea, nausea and vomiting.  Genitourinary: Negative.   Musculoskeletal:  Positive for joint pain and myalgias.  Skin:  Negative for rash.  Neurological:  Positive for tingling. Negative  for sensory change and focal weakness.  Endo/Heme/Allergies:  Bruises/bleeds easily (blood thinnners).  Psychiatric/Behavioral:  Positive for depression.               Past Medical History:  Diagnosis Date   Anxiety     Aortic stenosis     Cellulitis and abscess of lower extremity 06/11/2019   Cellulitis of fourth toe of left foot     Cholelithiasis     Coronary artery disease      Nonobstructive CAD (40-50% LAD) 08/2019   Depression     Diabetes mellitus without complication (HCC)      Phreesia 09/27/2020   Elevated troponin level not due myocardial infarction 11/11/2019   Essential hypertension     Gangrene of toe of left foot (HCC) 07/06/2019   Heart murmur      Phreesia 09/27/2020   Hyperlipidemia      Phreesia 09/27/2020   Hypertension      Phreesia 09/27/2020   Mixed hyperlipidemia     Morbid obesity (HCC)     S/P aortic valve replacement with mechanical valve 12/05/2019    25 mm Carbomedics top hat bileaflet mechanical valve via partial upper hemi-sternotomy   Severe aortic stenosis 09/24/2019   Type 2 diabetes mellitus (HCC)                        Past Surgical History:  Procedure Laterality Date   ABDOMINAL AORTOGRAM W/LOWER EXTREMITY N/A 07/06/2019    Procedure: ABDOMINAL AORTOGRAM W/LOWER  EXTREMITY;  Surgeon: Sherren Kerns, MD;  Location: [facility] INVASIVE CV LAB;  Service: Cardiovascular;  Laterality: N/A;  Bilateral   AMPUTATION Left 07/09/2019    Procedure: LEFT FOURTH and Fifth TOE AMPUTATION.;  Surgeon: Larina Earthly, MD;  Location: [facility] OR;  Service: Vascular;  Laterality: Left;   AMPUTATION Left 03/11/2021    Procedure: LEFT FOOT 5TH  AND 4TH RAY AMPUTATION;  Surgeon: Nadara Mustard, MD;  Location: [facility] OR;  Service: Orthopedics;  Laterality: Left;   AMPUTATION Left 08/28/2021    Procedure: AMPUTATION BELOW KNEE;  Surgeon: Nadara Mustard, MD;  Location: [facility] OR;  Service: Orthopedics;  Laterality: Left;   AMPUTATION Left 01/29/2023    Procedure: LEFT AMPUTATION  BELOW KNEE REVISION AND CLOSURE;  Surgeon: Nadara Mustard, MD;  Location: [facility] OR;  Service: Orthopedics;  Laterality: Left;   AMPUTATION Left 07/27/2023    Procedure: LEFT ABOVE KNEE AMPUTATION;  Surgeon: Nadara Mustard, MD;  Location: [facility] OR;  Service: Orthopedics;  Laterality: Left;   AORTIC VALVE REPLACEMENT N/A 12/05/2019    Procedure: PARTIAL STERNOTOMY FOR AORTIC VALVE REPLACEMENT (AVR), USING CARBOMEDICS SUPRA-ANNULAR TOP HAT ;  Surgeon: Purcell Nails, MD;  Location: [facility] OR;  Service: Open Heart Surgery;  Laterality: N/A;  No neck lines on left   APPLICATION OF WOUND VAC Left 10/30/2021    Procedure: APPLICATION OF WOUND VAC;  Surgeon: Nadara Mustard, MD;  Location: [facility] OR;  Service: Orthopedics;  Laterality: Left;   APPLICATION OF WOUND VAC Left 12/25/2021    Procedure: APPLICATION OF WOUND VAC;  Surgeon: Nadara Mustard, MD;  Location: [facility] OR;  Service: Orthopedics;  Laterality: Left;   APPLICATION OF WOUND VAC Left 01/26/2023    Procedure: APPLICATION OF WOUND VAC TO LEFT STUMP;  Surgeon: Tarry Kos, MD;  Location: [facility] OR;  Service: Orthopedics;  Laterality: Left;   CARDIAC VALVE REPLACEMENT N/A      Phreesia 09/27/2020   I & D EXTREMITY Left 01/26/2023    Procedure: IRRIGATION AND DEBRIDEMENT OF LEFT BKA STUMP;  Surgeon: Tarry Kos, MD;  Location: [facility] OR;  Service: Orthopedics;  Laterality: Left;   IR RADIOLOGY PERIPHERAL GUIDED IV START   10/05/2019   IR US GUIDE VASC ACCESS RIGHT   10/05/2019   MULTIPLE EXTRACTIONS WITH ALVEOLOPLASTY N/A 10/26/2019    Procedure: EXTRACTION OF TOOTH #'S 3, 5-11,19-28,  AND 32 WITH ALVEOLOPLASTY;  Surgeon: Charlynne Pander, DDS;  Location: [facility] OR;  Service: Oral Surgery;  Laterality: N/A;   RIGHT HEART CATH AND CORONARY ANGIOGRAPHY N/A 09/24/2019    Procedure: RIGHT HEART CATH AND CORONARY ANGIOGRAPHY;  Surgeon: Tonny Bollman, MD;  Location: [facility] INVASIVE CV LAB;  Service: Cardiovascular;  Laterality: N/A;   STUMP REVISION Left 10/30/2021    Procedure:  REVISION LEFT BELOW KNEE AMPUTATION;  Surgeon: Nadara Mustard, MD;  Location: [facility] OR;  Service: Orthopedics;  Laterality: Left;   STUMP REVISION Left 12/25/2021    Procedure: REVISION LEFT BELOW KNEE AMPUTATION;  Surgeon: Nadara Mustard, MD;  Location: [facility] OR;  Service: Orthopedics;  Laterality: Left;   TEE WITHOUT CARDIOVERSION N/A 12/05/2019    Procedure: TRANSESOPHAGEAL ECHOCARDIOGRAM (TEE);  Surgeon: Purcell Nails, MD;  Location: [facility] OR;  Service: Open Heart Surgery;  Laterality: N/A;   TEE WITHOUT CARDIOVERSION N/A 09/01/2021    Procedure: TRANSESOPHAGEAL ECHOCARDIOGRAM (TEE);  Surgeon: Little Ishikawa, MD;  Location: [facility] ENDOSCOPY;  Service: Cardiovascular;  Laterality: N/A;  Family History  Problem Relation Age of Onset   Cancer Mother          Brain tumor   Heart disease Father     Hyperlipidemia Father     Hypertension Father     Stroke Father     Heart murmur Sister     Heart attack Maternal Grandmother     Liver disease Maternal Grandfather     Breast cancer Paternal Grandmother     COPD Paternal Grandfather     Heart attack Maternal Aunt     Colon cancer Maternal Uncle 67   Pancreatic cancer Paternal Aunt     Heart disease Paternal Uncle     Prostate cancer Neg Hx            Social History:  reports that he has never smoked. He quit smokeless tobacco use about 3 years ago.  His smokeless tobacco use included chew. He reports current alcohol use of about 4.0 standard drinks of alcohol per week. He reports that he does not use drugs. Allergies:  Allergies  No Known Allergies                  Medications Prior to Admission  Medication Sig Dispense Refill   Continuous Glucose Sensor (FREESTYLE LIBRE 3 SENSOR) MISC Place 1 sensor on the skin every 14 days. Use to check glucose continuously 6 each 6   ezetimibe (ZETIA) 10 MG tablet Take 1 tablet (10 mg total) by mouth daily. 90 tablet 3   gabapentin (NEURONTIN) 300 MG capsule Take 2 capsules  (600 mg total) by mouth 2 (two) times daily. 360 capsule 3   insulin glargine (LANTUS) 100 UNIT/ML Solostar Pen Inject 30 Units into the skin daily. 15 mL 11   lisinopril (ZESTRIL) 20 MG tablet Take 1 tablet (20 mg total) by mouth daily. 90 tablet 3   metFORMIN (GLUCOPHAGE) 500 MG tablet Take 2 tablets (1,000 mg total) by mouth 2 (two) times daily with a meal. 360 tablet 3   metoprolol tartrate (LOPRESSOR) 25 MG tablet Take 1 tablet (25 mg total) by mouth 2 (two) times daily. 180 tablet 3   pantoprazole (PROTONIX) 40 MG tablet Take 1 tablet (40 mg total) by mouth daily. 90 tablet 3   sertraline (ZOLOFT) 50 MG tablet Take 1 tablet (50 mg total) by mouth daily. 90 tablet 3   warfarin (COUMADIN) 10 MG tablet TAKE 1 TABLET BY MOUTH DAILY EXCEPT TAKE  ONE-HALF TABLET  ON MONDAYS, WEDNESDAYS AND FRIDAYS OR AS DIRECTED (Patient taking differently: Take 5-10 mg by mouth See admin instructions. TAKE 1 TABLET BY MOUTH DAILY EXCEPT TAKE  ONE-HALF TABLET  ON MONDAYS, WEDNESDAYS AND FRIDAYS OR AS DIRECTED) 30 tablet 5   Zinc Sulfate 220 (50 Zn) MG TABS Take 1 tablet (220 mg total) by mouth daily. 100 tablet 0   Insulin Pen Needle 32G X 4 MM MISC Use with Lantus flexpen 100 each 0            Home: Home Living Family/patient expects to be discharged to:: Private residence Living Arrangements: Other relatives Available Help at Discharge: Family, Friend(s), Available PRN/intermittently Type of Home: House Home Access: Stairs to enter [employer] of Steps: 2 short steps Entrance Stairs-Rails: None Home Layout: Two level, Able to live on main level with bedroom/bathroom Alternate Level Stairs-Number of Steps: flight Alternate Level Stairs-Rails: Right, Left, Can reach both Bathroom Shower/Tub: Tub/shower unit Bathroom Toilet: Handicapped height Bathroom Accessibility: Yes Home Equipment: Agricultural consultant (  2 wheels), BSC/3in1, Wheelchair - manual, Tub bench Additional Comments: uncle, cousin also  helps pt  Functional History: Prior Function Prior Level of Function : Independent/Modified Independent, Working/employed, Driving Mobility Comments: Amb with L prosthesis and no AD. Uses RW or w/c when prosthesis not donned. ADLs Comments: mod I ADLs. Worked part time in [employer]. Functional Status:  Mobility: Bed Mobility Overal bed mobility: Needs Assistance Bed Mobility: Supine to Sit, Sit to Supine Supine to sit: Mod assist, Used rails, HOB elevated Sit to supine: Mod assist General bed mobility comments: assist with trunk and RLE, increased time, heavy use of bed rail Transfers Overall transfer level: Needs assistance General transfer comment: lateral scoots along EOB mod assist. Deferred OOB due to pain.   ADL: ADL Overall ADL's : Needs assistance/impaired Eating/Feeding: Independent, Bed level Grooming: Wash/dry hands, Wash/dry face, Contact guard assist, Sitting Upper Body Bathing: Contact guard assist, Sitting Lower Body Bathing: Maximal assistance, Sitting/lateral leans, Bed level Upper Body Dressing : Contact guard assist, Sitting Lower Body Dressing: Moderate assistance, Maximal assistance, Sitting/lateral leans, Bed level   Cognition: Cognition Overall Cognitive Status: Within Functional Limits for tasks assessed Orientation Level: Oriented X4 Cognition Arousal: Alert Behavior During Therapy: WFL for tasks assessed/performed Overall Cognitive Status: Within Functional Limits for tasks assessed   Blood pressure 129/67, pulse 79, temperature 98.4 F (36.9 C), temperature source Oral, resp. rate 18, height 5\' 10"  (1.778 m), weight (!) 169.5 kg, SpO2 96%. Physical Exam     General: No apparent distress, obese HEENT: Head is normocephalic, atraumatic, PERRLA, EOMI, sclera anicteric, oral mucosa pink and moist, + edentulous, has dentures.  + Glasses Neck: Supple without JVD or lymphadenopathy Heart: Reg rate and rhythm.  Chest: CTA bilaterally without  wheezes, rales, or rhonchi; no distress Abdomen: Soft, non-tender, non-distended, bowel sounds positive. Extremities: Left AKA with wound VAC in place, serosanguineous drainage in canister Psych: Pt's affect is appropriate. Pt is cooperative Skin: Clean and intact without signs of breakdown Neuro: Alert and awake, follows commands, cranial nerves II through XII intact, able to provide coherent history, no speech or language deficits noted RUE: 5/5 Deltoid, 5/5 Biceps, 5/5 Triceps, 5/5 Wrist Ext, 5/5 Grip LUE: 5/5 Deltoid, 5/5 Biceps, 5/5 Triceps, 5/5 Wrist Ext, 5/5 Grip RLE: HF 5/5, KE 5/5, ADF 5/5, APF 5/5 LLE: Able to lift residual limb slightly to gravity Sensory exam normal for light touch and pain in all 4 limbs. No limb ataxia or cerebellar signs. No abnormal tone appreciated.    Musculoskeletal: No joint swelling or tenderness noted     Lab Results Last 24 Hours           Results for orders placed or performed during the hospital encounter of 07/25/23 (from the past 24 hour(s))  Glucose, capillary     Status: Abnormal    Collection Time: 07/28/23 12:18 PM  Result Value Ref Range    Glucose-Capillary 224 (H) 70 - 99 mg/dL  Glucose, capillary     Status: Abnormal    Collection Time: 07/28/23  4:21 PM  Result Value Ref Range    Glucose-Capillary 298 (H) 70 - 99 mg/dL  Glucose, capillary     Status: Abnormal    Collection Time: 07/28/23  9:21 PM  Result Value Ref Range    Glucose-Capillary 241 (H) 70 - 99 mg/dL  Protime-INR     Status: Abnormal    Collection Time: 07/29/23  7:40 AM  Result Value Ref Range    Prothrombin Time 21.1 (H)  11.4 - 15.2 seconds    INR 1.8 (H) 0.8 - 1.2  CBC with Differential/Platelet     Status: Abnormal    Collection Time: 07/29/23  7:40 AM  Result Value Ref Range    WBC 11.7 (H) 4.0 - 10.5 K/uL    RBC 3.10 (L) 4.22 - 5.81 MIL/uL    Hemoglobin 6.6 (LL) 13.0 - 17.0 g/dL    HCT 16.1 (L) 09.6 - 52.0 %    MCV 73.2 (L) 80.0 - 100.0 fL    MCH 21.3  (L) 26.0 - 34.0 pg    MCHC 29.1 (L) 30.0 - 36.0 g/dL    RDW 04.5 (H) 40.9 - 15.5 %    Platelets 425 (H) 150 - 400 K/uL    nRBC 0.3 (H) 0.0 - 0.2 %    Neutrophils Relative % 67 %    Neutro Abs 8.0 (H) 1.7 - 7.7 K/uL    Lymphocytes Relative 20 %    Lymphs Abs 2.3 0.7 - 4.0 K/uL    Monocytes Relative 7 %    Monocytes Absolute 0.8 0.1 - 1.0 K/uL    Eosinophils Relative 2 %    Eosinophils Absolute 0.2 0.0 - 0.5 K/uL    Basophils Relative 1 %    Basophils Absolute 0.1 0.0 - 0.1 K/uL    Immature Granulocytes 3 %    Abs Immature Granulocytes 0.32 (H) 0.00 - 0.07 K/uL  Basic metabolic panel     Status: Abnormal    Collection Time: 07/29/23  7:40 AM  Result Value Ref Range    Sodium 133 (L) 135 - 145 mmol/L    Potassium 4.0 3.5 - 5.1 mmol/L    Chloride 103 98 - 111 mmol/L    CO2 23 22 - 32 mmol/L    Glucose, Bld 210 (H) 70 - 99 mg/dL    BUN 15 6 - 20 mg/dL    Creatinine, Ser 8.11 0.61 - 1.24 mg/dL    Calcium 8.0 (L) 8.9 - 10.3 mg/dL    GFR, Estimated >91 >47 mL/min    Anion gap 7 5 - 15  Magnesium     Status: None    Collection Time: 07/29/23  7:40 AM  Result Value Ref Range    Magnesium 1.8 1.7 - 2.4 mg/dL  Glucose, capillary     Status: Abnormal    Collection Time: 07/29/23  8:34 AM  Result Value Ref Range    Glucose-Capillary 202 (H) 70 - 99 mg/dL      Imaging Results (Last 48 hours)  No results found.      Assessment/Plan: Diagnosis: Left AKA due to BKA residual limb dehiscence Does the need for close, 24 hr/day medical supervision in concert with the patient's rehab needs make it unreasonable for this patient to be served in a less intensive setting? Yes Co-Morbidities requiring supervision/potential complications:  -Acute on chronic hyponatremia -Anemia of chronic disease, ABLA -UTI -Poorly controlled diabetes mellitus type 2, on insulin -Microscopic hematuria -CAD -Hypertension -Lipidemia -Anxiety and depression -Neuropathic pain and residual limb pain -History  of mechanical aortic valve on chronic anticoagulation -Morbid obesity -Thrombocytosis/leukocytosis   Due to bladder management, bowel management, safety, skin/wound care, disease management, medication administration, pain management, and patient education, does the patient require 24 hr/day rehab nursing? Yes Does the patient require coordinated care of a physician, rehab nurse, therapy disciplines of PT/OT to address physical and functional deficits in the context of the above medical diagnosis(es)? Yes Addressing deficits in the following areas:  balance, endurance, locomotion, strength, transferring, bowel/bladder control, bathing, dressing, feeding, grooming, toileting, and psychosocial support Can the patient actively participate in an intensive therapy program of at least 3 hrs of therapy per day at least 5 days per week? Yes The potential for patient to make measurable gains while on inpatient rehab is excellent Anticipated functional outcomes upon discharge from inpatient rehab are modified independent  with PT, modified independent with OT, n/a with SLP. Estimated rehab length of stay to reach the above functional goals is: 10 to 12 days Anticipated discharge destination: Home Overall Rehab/Functional Prognosis: excellent   POST ACUTE RECOMMENDATIONS: This patient's condition is appropriate for continued rehabilitative care in the following setting: CIR Patient has agreed to participate in recommended program. Yes Note that insurance prior authorization may be required for reimbursement for recommended care.   Comment: I think patient would be a good candidate for CIR pending medical stability.       MEDICAL RECOMMENDATIONS: Consider muscle relaxer for his lower extremity muscle spasms.     I have personally performed a face to face diagnostic evaluation of this patient. Additionally, I have examined the patient's medical record including any pertinent labs and radiographic images.  If the physician assistant has documented in this note, I have reviewed and edited or otherwise concur with the physician assistant's documentation.   Thanks,   Fanny Dance, MD 07/29/2023

## 2023-08-01 ENCOUNTER — Telehealth: Payer: Self-pay | Admitting: Orthopedic Surgery

## 2023-08-01 DIAGNOSIS — I10 Essential (primary) hypertension: Secondary | ICD-10-CM | POA: Diagnosis not present

## 2023-08-01 DIAGNOSIS — E1142 Type 2 diabetes mellitus with diabetic polyneuropathy: Secondary | ICD-10-CM | POA: Diagnosis not present

## 2023-08-01 DIAGNOSIS — S78112A Complete traumatic amputation at level between left hip and knee, initial encounter: Secondary | ICD-10-CM | POA: Diagnosis not present

## 2023-08-01 DIAGNOSIS — M792 Neuralgia and neuritis, unspecified: Secondary | ICD-10-CM

## 2023-08-01 DIAGNOSIS — D649 Anemia, unspecified: Secondary | ICD-10-CM

## 2023-08-01 DIAGNOSIS — Z794 Long term (current) use of insulin: Secondary | ICD-10-CM

## 2023-08-01 LAB — COMPREHENSIVE METABOLIC PANEL
ALT: 45 U/L — ABNORMAL HIGH (ref 0–44)
AST: 43 U/L — ABNORMAL HIGH (ref 15–41)
Albumin: 2.5 g/dL — ABNORMAL LOW (ref 3.5–5.0)
Alkaline Phosphatase: 57 U/L (ref 38–126)
Anion gap: 7 (ref 5–15)
BUN: 15 mg/dL (ref 6–20)
CO2: 28 mmol/L (ref 22–32)
Calcium: 9.2 mg/dL (ref 8.9–10.3)
Chloride: 96 mmol/L — ABNORMAL LOW (ref 98–111)
Creatinine, Ser: 0.81 mg/dL (ref 0.61–1.24)
GFR, Estimated: >60 mL/min (ref >60)
Glucose, Bld: 201 mg/dL — ABNORMAL HIGH (ref 70–99)
Potassium: 4.4 mmol/L (ref 3.5–5.1)
Sodium: 131 mmol/L — ABNORMAL LOW (ref 135–145)
Total Bilirubin: 0.6 mg/dL (ref <1.2)
Total Protein: 6.7 g/dL (ref 6.5–8.1)

## 2023-08-01 LAB — PROTIME-INR
INR: 1.3 — ABNORMAL HIGH (ref 0.8–1.2)
Prothrombin Time: 16.8 s — ABNORMAL HIGH (ref 11.4–15.2)

## 2023-08-01 LAB — CBC WITH DIFFERENTIAL/PLATELET
Abs Immature Granulocytes: 0.32 10*3/uL — ABNORMAL HIGH (ref 0.00–0.07)
Basophils Absolute: 0.1 10*3/uL (ref 0.0–0.1)
Basophils Relative: 1 %
Eosinophils Absolute: 0.3 10*3/uL (ref 0.0–0.5)
Eosinophils Relative: 3 %
HCT: 27.7 % — ABNORMAL LOW (ref 39.0–52.0)
Hemoglobin: 7.9 g/dL — ABNORMAL LOW (ref 13.0–17.0)
Immature Granulocytes: 3 %
Lymphocytes Relative: 24 %
Lymphs Abs: 2.4 10*3/uL (ref 0.7–4.0)
MCH: 20.8 pg — ABNORMAL LOW (ref 26.0–34.0)
MCHC: 28.5 g/dL — ABNORMAL LOW (ref 30.0–36.0)
MCV: 72.9 fL — ABNORMAL LOW (ref 80.0–100.0)
Monocytes Absolute: 0.6 10*3/uL (ref 0.1–1.0)
Monocytes Relative: 6 %
Neutro Abs: 6.4 10*3/uL (ref 1.7–7.7)
Neutrophils Relative %: 63 %
Platelets: 512 10*3/uL — ABNORMAL HIGH (ref 150–400)
RBC: 3.8 MIL/uL — ABNORMAL LOW (ref 4.22–5.81)
RDW: 17.4 % — ABNORMAL HIGH (ref 11.5–15.5)
WBC: 10.1 10*3/uL (ref 4.0–10.5)
nRBC: 0.3 % — ABNORMAL HIGH (ref 0.0–0.2)

## 2023-08-01 LAB — GLUCOSE, CAPILLARY
Glucose-Capillary: 194 mg/dL — ABNORMAL HIGH (ref 70–99)
Glucose-Capillary: 217 mg/dL — ABNORMAL HIGH (ref 70–99)
Glucose-Capillary: 215 mg/dL — ABNORMAL HIGH (ref 70–99)
Glucose-Capillary: 205 mg/dL — ABNORMAL HIGH (ref 70–99)

## 2023-08-01 LAB — SURGICAL PATHOLOGY

## 2023-08-01 MED ORDER — MEDIHONEY WOUND/BURN DRESSING EX PSTE
1.0000 | PASTE | Freq: Every day | CUTANEOUS | Status: DC
  Administered 2023-08-01 – 2023-08-17 (×17): 1 via TOPICAL
  Filled 2023-08-01: qty 44

## 2023-08-01 MED ORDER — HYDROCERIN EX CREA
TOPICAL_CREAM | Freq: Two times a day (BID) | CUTANEOUS | Status: DC
  Administered 2023-08-13: 1 via TOPICAL
  Filled 2023-08-01: qty 113

## 2023-08-01 MED ORDER — WARFARIN SODIUM 2.5 MG PO TABS
10.0000 mg | ORAL_TABLET | Freq: Once | ORAL | Status: AC
  Administered 2023-08-01: 10 mg via ORAL
  Filled 2023-08-01: qty 4

## 2023-08-01 MED ORDER — BLOOD PRESSURE CONTROL BOOK
Freq: Once | Status: AC
  Filled 2023-08-01: qty 1

## 2023-08-01 MED ORDER — GABAPENTIN 300 MG PO CAPS
600.0000 mg | ORAL_CAPSULE | Freq: Three times a day (TID) | ORAL | Status: DC
  Administered 2023-08-01 – 2023-08-04 (×9): 600 mg via ORAL
  Filled 2023-08-01 (×9): qty 2

## 2023-08-01 MED ORDER — LIVING WELL WITH DIABETES BOOK
Freq: Once | Status: AC
  Filled 2023-08-01: qty 1

## 2023-08-01 NOTE — Telephone Encounter (Signed)
Called to inform Dr. Lajoyce Corners that patient is bleeding around wound vac site. Says it is not urgent for someone to come by, but stated if someone will come by to just take a look and see if anything needs to be done. He is still in hospital

## 2023-08-01 NOTE — Evaluation (Addendum)
Physical Therapy Assessment and Plan  Patient Details  Name: Adam Long MRN: [id number] Date of Birth: 05-24-1976  PT Diagnosis: Difficulty walking, Impaired sensation, Muscle spasms, Muscle weakness, and Pain in residual limb Rehab Potential: Fair ELOS: 14-16 days   Today's Date: 08/01/2023 PT Individual Time: 5784-6962 PT Individual Time Calculation (min): 75 min    Hospital Problem: Principal Problem:   Above knee amputation of left lower extremity (HCC)   Past Medical History:  Past Medical History:  Diagnosis Date   Anxiety    Aortic stenosis    Cellulitis and abscess of lower extremity 06/11/2019   Cellulitis of fourth toe of left foot    Cholelithiasis    Coronary artery disease    Nonobstructive CAD (40-50% LAD) 08/2019   Depression    Diabetes mellitus without complication (HCC)    Phreesia 09/27/2020   Elevated troponin level not due myocardial infarction 11/11/2019   Essential hypertension    Gangrene of toe of left foot (HCC) 07/06/2019   Heart murmur    Phreesia 09/27/2020   Hyperlipidemia    Phreesia 09/27/2020   Hypertension    Phreesia 09/27/2020   Mixed hyperlipidemia    Morbid obesity (HCC)    S/P aortic valve replacement with mechanical valve 12/05/2019   25 mm Carbomedics top hat bileaflet mechanical valve via partial upper hemi-sternotomy   Severe aortic stenosis 09/24/2019   Type 2 diabetes mellitus (HCC)    Past Surgical History:  Past Surgical History:  Procedure Laterality Date   ABDOMINAL AORTOGRAM W/LOWER EXTREMITY N/A 07/06/2019   Procedure: ABDOMINAL AORTOGRAM W/LOWER EXTREMITY;  Surgeon: Sherren Kerns, MD;  Location: [facility];  Service: Cardiovascular;  Laterality: N/A;  Bilateral   AMPUTATION Left 07/09/2019   Procedure: LEFT FOURTH and Fifth TOE AMPUTATION.;  Surgeon: Larina Earthly, MD;  Location: [facility] OR;  Service: Vascular;  Laterality: Left;   AMPUTATION Left 03/11/2021   Procedure: LEFT FOOT 5TH  AND 4TH RAY  AMPUTATION;  Surgeon: Nadara Mustard, MD;  Location: [facility] OR;  Service: Orthopedics;  Laterality: Left;   AMPUTATION Left 08/28/2021   Procedure: AMPUTATION BELOW KNEE;  Surgeon: Nadara Mustard, MD;  Location: [facility] OR;  Service: Orthopedics;  Laterality: Left;   AMPUTATION Left 01/29/2023   Procedure: LEFT AMPUTATION BELOW KNEE REVISION AND CLOSURE;  Surgeon: Nadara Mustard, MD;  Location: [facility] OR;  Service: Orthopedics;  Laterality: Left;   AMPUTATION Left 07/27/2023   Procedure: LEFT ABOVE KNEE AMPUTATION;  Surgeon: Nadara Mustard, MD;  Location: [facility] OR;  Service: Orthopedics;  Laterality: Left;   AORTIC VALVE REPLACEMENT N/A 12/05/2019   Procedure: PARTIAL STERNOTOMY FOR AORTIC VALVE REPLACEMENT (AVR), USING CARBOMEDICS SUPRA-ANNULAR TOP HAT ;  Surgeon: Purcell Nails, MD;  Location: [facility] OR;  Service: Open Heart Surgery;  Laterality: N/A;  No neck lines on left   APPLICATION OF WOUND VAC Left 10/30/2021   Procedure: APPLICATION OF WOUND VAC;  Surgeon: Nadara Mustard, MD;  Location: [facility] OR;  Service: Orthopedics;  Laterality: Left;   APPLICATION OF WOUND VAC Left 12/25/2021   Procedure: APPLICATION OF WOUND VAC;  Surgeon: Nadara Mustard, MD;  Location: [facility] OR;  Service: Orthopedics;  Laterality: Left;   APPLICATION OF WOUND VAC Left 01/26/2023   Procedure: APPLICATION OF WOUND VAC TO LEFT STUMP;  Surgeon: Tarry Kos, MD;  Location: [facility] OR;  Service: Orthopedics;  Laterality: Left;   CARDIAC VALVE REPLACEMENT N/A    Phreesia 09/27/2020   I &  D EXTREMITY Left 01/26/2023   Procedure: IRRIGATION AND DEBRIDEMENT OF LEFT BKA STUMP;  Surgeon: Tarry Kos, MD;  Location: [facility] OR;  Service: Orthopedics;  Laterality: Left;   IR RADIOLOGY PERIPHERAL GUIDED IV START  10/05/2019   IR US GUIDE VASC ACCESS RIGHT  10/05/2019   MULTIPLE EXTRACTIONS WITH ALVEOLOPLASTY N/A 10/26/2019   Procedure: EXTRACTION OF TOOTH #'S 3, 5-11,19-28,  AND 32 WITH ALVEOLOPLASTY;  Surgeon: Charlynne Pander, DDS;  Location: [facility] OR;  Service: Oral  Surgery;  Laterality: N/A;   RIGHT HEART CATH AND CORONARY ANGIOGRAPHY N/A 09/24/2019   Procedure: RIGHT HEART CATH AND CORONARY ANGIOGRAPHY;  Surgeon: Tonny Bollman, MD;  Location: [facility] INVASIVE CV LAB;  Service: Cardiovascular;  Laterality: N/A;   STUMP REVISION Left 10/30/2021   Procedure: REVISION LEFT BELOW KNEE AMPUTATION;  Surgeon: Nadara Mustard, MD;  Location: [facility] OR;  Service: Orthopedics;  Laterality: Left;   STUMP REVISION Left 12/25/2021   Procedure: REVISION LEFT BELOW KNEE AMPUTATION;  Surgeon: Nadara Mustard, MD;  Location: [facility] OR;  Service: Orthopedics;  Laterality: Left;   TEE WITHOUT CARDIOVERSION N/A 12/05/2019   Procedure: TRANSESOPHAGEAL ECHOCARDIOGRAM (TEE);  Surgeon: Purcell Nails, MD;  Location: [facility] OR;  Service: Open Heart Surgery;  Laterality: N/A;   TEE WITHOUT CARDIOVERSION N/A 09/01/2021   Procedure: TRANSESOPHAGEAL ECHOCARDIOGRAM (TEE);  Surgeon: Little Ishikawa, MD;  Location: [facility] ENDOSCOPY;  Service: Cardiovascular;  Laterality: N/A;    Assessment & Plan Clinical Impression:Adam Long is a 47 y.o. male with past medical history of hypertension, hyperlipidemia, CAD, s/p AVR on Coumadin, diabetes mellitus with A1c 10.2, PAD with history of left BKA, anxiety, depression, and morbid obesity.  Patient had multiple residual revisions in the past (last revision May 2024 due to dehiscence), presented to the ED on 07/25/23 with purulent drainage and wound dehiscence of his left BKA. Xray showed diffuse soft tissue swelling c/w cellulitis but no acute osteomyelitis, labs without leukocytosis, Hgb 8.1 near baseline, sodium 128 (baseline 130s), glucose 293, lactic WNL x2, Urine with ?UTI vs klebsiella pneumoniae colonization treated with IV abx x4 days, Blood Cx neg x2.  Ultimately he required left AKA completed by Dr. Lajoyce Corners on 07/27/2023 with Prevena wound VAC applied.  He completed 4 days of rocephin and 3 days of flagyl, d/c'd abx on 07/28/23.   Treated for acute on  chronic hyponatremia. Hemoglobin 6.6 on 07/29/23, transfused 1U PRBC, up to 7.4 on 07/31/23, to be transfused again if <7. INR subtherapeutic, being bridged with lovenox.   Patient has been evaluated by physical therapy and found to be mod assist for bed mobility and mod assist for lateral scoots along edge of bed.  He has been seen by Occupational Therapy and he requires mod assist for lower body dressing, max assist for lower body bathing. The patient requires inpatient physical medicine and rehabilitation evaluations and treatment secondary to L AKA.    PTA patient lived with his uncle, modified independent for mobility with his left below the knee amputation prosthetic device.  Patient lives in a two-story home but only uses the first floor.  2 steps to enter.  His uncle can assist him after discharge. Patient transferred to CIR on 07/31/2023 .   Patient currently requires max with mobility secondary to muscle weakness, decreased cardiorespiratoy endurance, and decreased standing balance, decreased postural control, decreased balance strategies, and difficulty maintaining precautions.  Prior to hospitalization, patient was modified independent  with mobility and lived with Other (Comment) in a  House home.  Home access is 2 short stepsStairs to enter.  Patient will benefit from skilled PT intervention to maximize safe functional mobility, minimize fall risk, and decrease caregiver burden for planned discharge home with intermittent assist.  Anticipate patient will benefit from follow up [facility] at discharge.  PT - End of Session Activity Tolerance: Tolerates 10 - 20 min activity with multiple rests Endurance Deficit: Yes Endurance Deficit Description: mild light headedness when sitting up, vitals stable PT Assessment Rehab Potential (ACUTE/IP ONLY): Fair PT Barriers to Discharge: Inaccessible home environment;Home environment access/layout;Decreased caregiver support;Wound Care;Weight;Weight bearing  restrictions PT Patient demonstrates impairments in the following area(s): Balance;Pain;Safety;Edema;Endurance;Sensory;Motor;Skin Integrity PT Transfers Functional Problem(s): Bed to Chair;Bed Mobility;Car PT Locomotion Functional Problem(s): Wheelchair Mobility;Ambulation PT Plan PT Intensity: Minimum of 1-2 x/day ,45 to 90 minutes PT Frequency: 5 out of 7 days PT Duration Estimated Length of Stay: 14-16 days PT Treatment/Interventions: Ambulation/gait training;Discharge planning;DME/adaptive equipment instruction;Functional mobility training;Pain management;Psychosocial support;Splinting/orthotics;Therapeutic Activities;UE/LE Strength taining/ROM;UE/LE Coordination activities;Therapeutic Exercise;Stair training;Wheelchair propulsion/positioning;Skin care/wound management;Patient/family education;Neuromuscular re-education;Disease management/prevention;Community reintegration;Balance/vestibular training PT Transfers Anticipated Outcome(s): CGA with LRAD PT Locomotion Anticipated Outcome(s): min a short distance gait, mod I w/c PT Recommendation Follow Up Recommendations: Home health PT Equipment Recommended: To be determined Destination : home   PT Evaluation Precautions/Restrictions   General PT Amount of Missed Time (min): 16 Minutes PT Missed Treatment Reason: Pain Vital Signs  Pain  Up to 8/10 after mobility, premedicated. Rest and positioning provided as needed.  Pain Interference Pain Interference Pain Effect on Sleep: 2. Occasionally Pain Interference with Therapy Activities: 1. Rarely or not at all Pain Interference with Day-to-Day Activities: 1. Rarely or not at all Home Living/Prior Functioning Home Living Living Arrangements: Other relatives  Cognition Orientation Level: Oriented X4 Sensation Sensation Light Touch: Appears Intact Hot/Cold: Not tested Proprioception: Appears Intact Stereognosis: Not tested Coordination Gross Motor Movements are Fluid and  Coordinated: No Coordination and Movement Description: limited d/t gross weakness and recent amputation revision Motor  Motor Motor: Within Functional Limits Motor - Skilled Clinical Observations: WFL, limited by pain and recent amputation   Trunk/Postural Assessment  Cervical Assessment Cervical Assessment: Within Functional Limits Thoracic Assessment Thoracic Assessment: Within Functional Limits Lumbar Assessment Lumbar Assessment: Within Functional Limits Postural Control Postural Control: Within Functional Limits  Balance Balance Balance Assessed: Yes Static Sitting Balance Static Sitting - Balance Support: Feet supported Static Sitting - Level of Assistance: 5: Stand by assistance Dynamic Sitting Balance Dynamic Sitting - Balance Support: Feet supported Dynamic Sitting - Level of Assistance: 5: Stand by assistance Extremity Assessment      RLE Assessment RLE Assessment: Within Functional Limits General Strength Comments: grossly 5/5 LLE Assessment LLE Assessment: Exceptions to [facility] General Strength Comments: Grossly 3/5 hip strength, no resistance d/t high levels of pain.  Care Tool Care Tool Bed Mobility Roll left and right activity   Roll left and right assist level: Moderate Assistance - Patient 50 - 74%    Sit to lying activity   Sit to lying assist level: Minimal Assistance - Patient > 75%    Lying to sitting on side of bed activity   Lying to sitting on side of bed assist level: the ability to move from lying on the back to sitting on the side of the bed with no back support.: Contact Guard/Touching assist     Care Tool Transfers Sit to stand transfer Sit to stand activity did not occur: Safety/medical concerns      Chair/bed transfer Chair/bed transfer activity did not  occur: Safety/medical Haematologist transfer activity did not occur: Safety/medical concerns        Care Tool Locomotion Ambulation Ambulation activity did not  occur: Safety/medical concerns        Walk 10 feet activity Walk 10 feet activity did not occur: Safety/medical concerns       Walk 50 feet with 2 turns activity Walk 50 feet with 2 turns activity did not occur: Safety/medical concerns      Walk 150 feet activity Walk 150 feet activity did not occur: Safety/medical concerns      Walk 10 feet on uneven surfaces activity Walk 10 feet on uneven surfaces activity did not occur: Safety/medical concerns      Stairs Stair activity did not occur: Safety/medical concerns        Walk up/down 1 step activity Walk up/down 1 step or curb (drop down) activity did not occur: Safety/medical concerns      Walk up/down 4 steps activity Walk up/down 4 steps activity did not occur: Safety/medical concerns      Walk up/down 12 steps activity Walk up/down 12 steps activity did not occur: Safety/medical concerns      Pick up small objects from floor Pick up small object from the floor (from standing position) activity did not occur: Safety/medical concerns      Wheelchair Is the patient using a wheelchair?: Yes Type of Wheelchair: Manual Wheelchair activity did not occur: Safety/medical concerns      Wheel 50 feet with 2 turns activity Wheelchair 50 feet with 2 turns activity did not occur: Safety/medical concerns    Wheel 150 feet activity Wheelchair 150 feet activity did not occur: Safety/medical concerns      Refer to Care Plan for Long Term Goals  SHORT TERM GOAL WEEK 1 PT Short Term Goal 1 (Week 1): Pt will perform STS with mod a PT Short Term Goal 2 (Week 1): Pt will perform bed/chair transfer with mod a PT Short Term Goal 3 (Week 1): Pt will propel w/c >100 ft  Recommendations for other services: None   Skilled Therapeutic Intervention Evaluation completed (see details above) with patient education regarding purpose of PT evaluation, PT POC and goals, therapy schedule, weekly team meetings, and other CIR information including  safety plan and fall risk safety. Evaluation was limited by up to 8/10 pain during bed mobility and dressing. Pt able to come to EOB with increased time and CGA with bed features, but in too much pain after donning shorts to attempt further mobility, rest and positioning as needed.  Pt performed the below functional mobility tasks with the specified levels of skilled cuing and assistance. Therapist retrieved appropriate equipment, was left with all needs in reach and alarm active.   Mobility Bed Mobility Bed Mobility: Rolling Right;Rolling Left;Sit to Supine;Supine to Sit Rolling Right: Moderate Assistance - Patient 50-74% Rolling Left: Contact Guard/Touching assist Supine to Sit: Contact Guard/Touching assist Sit to Supine: Minimal Assistance - Patient > 75% Locomotion  Gait Ambulation: No Gait Gait: No Stairs / Additional Locomotion Stairs: No Wheelchair Mobility Wheelchair Mobility: No   Discharge Criteria: Patient will be discharged from PT if patient refuses treatment 3 consecutive times without medical reason, if treatment goals not met, if there is a change in medical status, if patient makes no progress towards goals or if patient is discharged from hospital.  The above assessment, treatment plan, treatment alternatives and goals were discussed and mutually agreed upon: by patient  Juluis Rainier 08/01/2023, 12:58 PM

## 2023-08-01 NOTE — Discharge Summary (Signed)
Physician Discharge Summary  Patient ID: COUGAR ENGE MRN: [id number] DOB/AGE: April 24, 1976 47 y.o.  Admit date: 07/31/2023 Discharge date: 08/17/2023  Discharge Diagnoses:  Principal Problem:   Above knee amputation of left lower extremity (HCC) DVT prophylaxis Pain management Acute on chronic anemia Hypertension Hyperlipidemia CAD/mechanical valve GERD Morbid obesity MRSA positive nasal swab Mood stabilization Diabetes mellitus  Discharged Condition: Stable  Significant Diagnostic Studies: US RENAL  Result Date: 08/12/2023 CLINICAL DATA:  99102 Nephrolithiasis 99102 EXAM: RENAL / URINARY TRACT ULTRASOUND COMPLETE COMPARISON:  06/28/2023 FINDINGS: Right Kidney: Renal measurements: 11.9 x 5.7 x 6.2 cm = volume: 222 mL. Echogenicity within normal limits. No mass or hydronephrosis visualized. Left Kidney: Renal measurements: 14.8 x 7.7 x 6.9 cm = volume: 413 mL. Echogenicity within normal limits. No mass or hydronephrosis visualized. Bladder: Appears normal for degree of bladder distention. Other: Echogenic appearance of the imaged hepatic parenchyma. Technical note: Limited study secondary to poor penetration related to patient body habitus. IMPRESSION: 1. No hydronephrosis. The renal stones seen on prior CT were not visible sonographically. 2. Hepatic steatosis. Electronically Signed   By: Duanne Guess D.O.   On: 08/12/2023 20:24   DG Abd 2 Views  Result Date: 08/12/2023 CLINICAL DATA:  Abdominal pain EXAM: ABDOMEN - 2 VIEW COMPARISON:  06/28/2023 FINDINGS: Supine and upright frontal views of the abdomen and pelvis are obtained. Bilateral flanks are excluded by collimation and patient body habitus. No bowel obstruction or ileus. No masses or abnormal calcifications. No free gas in the greater peritoneal sac. Lung bases are clear. IMPRESSION: 1. Unremarkable bowel gas pattern. Electronically Signed   By: Sharlet Salina M.D.   On: 08/12/2023 18:49   DG Knee 2 Views Left  Result  Date: 07/25/2023 CLINICAL DATA:  Wound infection, diabetes EXAM: LEFT KNEE - 1-2 VIEW COMPARISON:  01/23/2023 FINDINGS: Frontal and lateral views of the left knee are obtained. Prior below-knee amputation. No acute fracture, subluxation, or dislocation. Joint spaces are well preserved. There is diffuse subcutaneous edema. No evidence of subcutaneous gas or radiopaque foreign body. No evidence of bony erosion or periosteal reaction to suggest acute osteomyelitis. IMPRESSION: 1. Diffuse soft tissue swelling consistent with cellulitis. 2. No evidence of bony destruction or periosteal reaction to suggest acute osteomyelitis. The appearance of the below-knee amputation site is similar to prior CT exam. Electronically Signed   By: Sharlet Salina M.D.   On: 07/25/2023 19:12   DG Chest 2 View  Result Date: 07/25/2023 CLINICAL DATA:  Diabetes, hypertension, wound infection EXAM: CHEST - 2 VIEW COMPARISON:  02/06/2023 FINDINGS: Frontal and lateral views of the chest demonstrate stable enlargement of the cardiac silhouette. No airspace disease, effusion, or pneumothorax. No acute bony abnormalities. IMPRESSION: 1. Stable chest, no acute process. Electronically Signed   By: Sharlet Salina M.D.   On: 07/25/2023 19:10    Labs:  Basic Metabolic Panel: Recent Labs  Lab 08/10/23 0533 08/13/23 0457 08/15/23 0605  NA 132* 136 134*  K 4.1 4.2 4.5  CL 98 103 101  CO2 26 25 25   GLUCOSE 111* 115* 154*  BUN 16 19 16   CREATININE 1.15 1.38* 0.95  CALCIUM 8.9 8.7* 8.7*    CBC: Recent Labs  Lab 08/13/23 0457 08/14/23 0504 08/15/23 0605  WBC 7.3 6.9 7.9  HGB 7.4* 7.4* 7.9*  HCT 25.3* 26.0* 26.7*  MCV 73.3* 73.9* 73.8*  PLT 313 315 330    CBG: Recent Labs  Lab 08/15/23 2034 08/16/23 0618 08/16/23 1152 08/16/23 1646 08/16/23 2048  GLUCAP 143* 111* 96 151* 138*   Family history.  Father with CAD hypertension and hyperlipidemia.  Paternal grandmother with breast cancer.  Denies any esophageal cancer  or prostate cancer  Brief HPI:   Adam Long is a 47 y.o. right-handed male with a history of hypertension hyperlipidemia, CAD status post AVR on Coumadin, diabetes mellitus with hemoglobin A1c 10.2, PAD with history of left BKA anxiety depression and morbid obesity.  Per chart review patient lives with uncle.  Modified independent with mobility.  Patient had multiple residual revisions in the past last revision in May 2024 due to dehiscence.  Presented to the ED on 07/25/2019 for purulent drainage and wound dehiscence of left BKA.  X-ray showed diffuse soft tissue swelling with cellulitis but no acute osteomyelitis.  Hemoglobin 8.1 sodium 128 glucose 293 lactic acid within normal limits x 2, urine with question UTI versus Klebsiella colonization treated with IV antibiotics x 4 days blood cultures negative.  Ultimately patient required left AKA completed by Dr. Lajoyce Corners 07/27/2023 with Prevena wound VAC applied.  He completed 4 days of Rocephin and 3 days of Flagyl discontinued antibiotic 07/28/2023.  Hospital course hemoglobin dipped to 6.6 07/29/2023 transfuse 1 unit packed red blood cells improved to 7.4.  In regards to patient's chronic Coumadin he was bridged with Lovenox and Coumadin resumed.  Therapy evaluations completed due to patient decreased functional mobility was admitted for a comprehensive rehab program.   Hospital Course: RITCHARD HARKLESS was admitted to rehab 07/31/2023 for inpatient therapies to consist of PT, ST and OT at least three hours five days a week. Past admission physiatrist, therapy team and rehab RN have worked together to provide customized collaborative inpatient rehab.  In regards to patient's left BKA with multiple revisions in the past status post AKA 07/27/2023.  Wound VAC in place as per protocol patient would follow-up with Dr. Lajoyce Corners.  Maintained on Lovenox bridging for chronic Coumadin with history of mechanical aortic valve no bleeding episodes.  Patient will continue to be  followed by cardiology services.  Blood sugars monitored hemoglobin A1c 10.2 insulin therapy as directed.  Acute on chronic anemia patient had been transfused during hospital course monitoring for any bleeding episodes.  Blood pressure remained a bit soft his lisinopril was decreased and later discontinued he will continue with beta-blocker as indicated and would need outpatient follow-up.  Zetia ongoing for hyperlipidemia.  Pain control with use of oxycodone as well as Neurontin which was adjusted accordingly with Robaxin as needed for muscle spasms.  Mood stabilization Zoloft ongoing patient was attending full therapies with emotional support provided.  Morbid obesity BMI 41.79 dietary follow-up.  History of GERD with Protonix as indicated.   Blood pressures were monitored on TID basis and soft and monitored  Diabetes has been monitored with ac/hs CBG checks and SSI was use prn for tighter BS control.    Rehab course: During patient's stay in rehab weekly team conferences were held to monitor patient's progress, set goals and discuss barriers to discharge. At admission, patient required moderate assist supine to sit moderate assist sit to supine  Physical exam.  Blood pressure 128/70 pulse 80 temperature 98 respirations 18 oxygen saturation is 92% room air Constitutional.  No acute distress HEENT Head.  Normocephalic and atraumatic Eyes.  Pupils round and reactive to light no discharge without nystagmus Neck.  Supple nontender no JVD without thyromegaly Cardiac regular rate and rhythm without any extra sounds or murmur heard Abdomen.  Soft nontender positive  bowel sounds without rebound Respiratory effort normal no respiratory distress without wheeze Musculoskeletal Comments.  Uses 5/5 strength biceps triceps wrist extension grip and FA B/L Right lower extremity 5 -/5 in hip flexors knee extension dorsi plantarflexion Left lower extremity 2 -/5 in hip flexors Skin.  Left AKA with wound VAC  in place Neurologic.  Alert and oriented x 3  He/She  has had improvement in activity tolerance, balance, postural control as well as ability to compensate for deficits. He/She has had improvement in functional use RUE/LUE  and RLE/LLE as well as improvement in awareness.  Education provided on [employer] education for Database administrator with patient and family.  Perform stand pivot transfers rolling walker, wheelchair to car simulator with supervision.  A send/descend ramp with modified independent.  Bed mobility, functional transfers, lower body dressing/bathing and AE/DME recommendations reviewed.  Patient caregiver witnessing all transfers with supervision modified independent level.  Full family teaching completed plan discharge to home   Media Information   Document Information  Photos  Left stump  08/09/2023 16:58  Attached To:  Hospital Encounter on 07/31/23  Source Information  Nida, Felisa Bonier, RN  [facility]  Document History         Disposition: Discharge to home    Diet: Diabetic diet  Special Instructions: No driving smoking or alcohol  Follow up [facility] [phone] FAX [phone]  Cleanse incision daily warm soap and water using Dial soap.  Apply dry dressing with 4 x 4 gauze and tape  08/15/2023.  Prothrombin time 25 INR 2.2 and 2.4 on 08/17/2023  Medications at discharge. 1.  Vitamin C 1000 mg p.o. daily 2.  Zetia 10 mg p.o. daily 3.  Neurontin 800 mg p.o. three times daily 4.  Lantus insulin 38 units daily 5.  MiraLAX daily hold for loose stools 6.  Glucophage 1000 mg p.o. twice daily 7.  Robaxin 500 mg p.o. every 6 hours as needed muscle spasms 8.  Lopressor 25 mg p.o. twice daily 9.  Oxycodone 5  mg every 6 hours as needed pain 10.  Protonix 40 mg p.o. daily 11.  Zoloft 50 mg p.o. daily 12.  Zinc sulfate 220 mg p.o. daily 13.  Coumadin daily adjusted accordingly INR 2.0-3.0 14.  Medihoney apply thin layer to  wound daily 15.  Coumadin 5 mg 1 tablet Monday Wednesday Friday 2 tablets Tuesday Thursday Saturday  Discharge Instructions     Ambulatory referral to Physical Medicine Rehab   Complete by: As directed    Moderate complexity follow-up 1 to 2 weeks left AKA        Follow-up Information     Fanny Dance, MD Follow up.   Specialty: Physical Medicine and Rehabilitation Why: Office to call for appointment Contact information: [address] [place] [address] [phone]         Nadara Mustard, MD Follow up.   Specialty: Orthopedic Surgery Why: Call for appointment Contact information: [address] [place] [address] [phone]         Jonelle Sidle, MD Follow up.   Specialty: Cardiology Why: Call for appointment Contact information: [address] [place] [address] [phone]                 Signed: Mcarthur Rossetti Sheri Gatchel 08/17/2023, 5:02 AM

## 2023-08-01 NOTE — Telephone Encounter (Signed)
Noted  

## 2023-08-01 NOTE — Progress Notes (Signed)
Inpatient Rehabilitation Care Coordinator Assessment and Plan Patient Details  Name: Adam Long MRN: [id number] Date of Birth: October 16, 1975  Today's Date: 08/01/2023  Hospital Problems: Principal Problem:   Above knee amputation of left lower extremity [facility])  Past Medical History:  Past Medical History:  Diagnosis Date   Anxiety    Aortic stenosis    Cellulitis and abscess of lower extremity 06/11/2019   Cellulitis of fourth toe of left foot    Cholelithiasis    Coronary artery disease    Nonobstructive CAD (40-50% LAD) 08/2019   Depression    Diabetes mellitus without complication (HCC)    Phreesia 09/27/2020   Elevated troponin level not due myocardial infarction 11/11/2019   Essential hypertension    Gangrene of toe of left foot (HCC) 07/06/2019   Heart murmur    Phreesia 09/27/2020   Hyperlipidemia    Phreesia 09/27/2020   Hypertension    Phreesia 09/27/2020   Mixed hyperlipidemia    Morbid obesity (HCC)    S/P aortic valve replacement with mechanical valve 12/05/2019   25 mm Carbomedics top hat bileaflet mechanical valve via partial upper hemi-sternotomy   Severe aortic stenosis 09/24/2019   Type 2 diabetes mellitus (HCC)    Past Surgical History:  Past Surgical History:  Procedure Laterality Date   ABDOMINAL AORTOGRAM W/LOWER EXTREMITY N/A 07/06/2019   Procedure: ABDOMINAL AORTOGRAM W/LOWER EXTREMITY;  Surgeon: Sherren Kerns, MD;  Location: [facility] INVASIVE CV LAB;  Service: Cardiovascular;  Laterality: N/A;  Bilateral   AMPUTATION Left 07/09/2019   Procedure: LEFT FOURTH and Fifth TOE AMPUTATION.;  Surgeon: Larina Earthly, MD;  Location: [facility] OR;  Service: Vascular;  Laterality: Left;   AMPUTATION Left 03/11/2021   Procedure: LEFT FOOT 5TH  AND 4TH RAY AMPUTATION;  Surgeon: Nadara Mustard, MD;  Location: [facility] OR;  Service: Orthopedics;  Laterality: Left;   AMPUTATION Left 08/28/2021   Procedure: AMPUTATION BELOW KNEE;  Surgeon: Nadara Mustard, MD;  Location: [facility] OR;   Service: Orthopedics;  Laterality: Left;   AMPUTATION Left 01/29/2023   Procedure: LEFT AMPUTATION BELOW KNEE REVISION AND CLOSURE;  Surgeon: Nadara Mustard, MD;  Location: [facility] OR;  Service: Orthopedics;  Laterality: Left;   AMPUTATION Left 07/27/2023   Procedure: LEFT ABOVE KNEE AMPUTATION;  Surgeon: Nadara Mustard, MD;  Location: [facility] OR;  Service: Orthopedics;  Laterality: Left;   AORTIC VALVE REPLACEMENT N/A 12/05/2019   Procedure: PARTIAL STERNOTOMY FOR AORTIC VALVE REPLACEMENT (AVR), USING CARBOMEDICS SUPRA-ANNULAR TOP HAT ;  Surgeon: Purcell Nails, MD;  Location: [facility] OR;  Service: Open Heart Surgery;  Laterality: N/A;  No neck lines on left   APPLICATION OF WOUND VAC Left 10/30/2021   Procedure: APPLICATION OF WOUND VAC;  Surgeon: Nadara Mustard, MD;  Location: [facility] OR;  Service: Orthopedics;  Laterality: Left;   APPLICATION OF WOUND VAC Left 12/25/2021   Procedure: APPLICATION OF WOUND VAC;  Surgeon: Nadara Mustard, MD;  Location: [facility] OR;  Service: Orthopedics;  Laterality: Left;   APPLICATION OF WOUND VAC Left 01/26/2023   Procedure: APPLICATION OF WOUND VAC TO LEFT STUMP;  Surgeon: Tarry Kos, MD;  Location: [facility] OR;  Service: Orthopedics;  Laterality: Left;   CARDIAC VALVE REPLACEMENT N/A    Phreesia 09/27/2020   I & D EXTREMITY Left 01/26/2023   Procedure: IRRIGATION AND DEBRIDEMENT OF LEFT BKA STUMP;  Surgeon: Tarry Kos, MD;  Location: [facility] OR;  Service: Orthopedics;  Laterality: Left;   IR RADIOLOGY PERIPHERAL  GUIDED IV START  10/05/2019   IR US GUIDE VASC ACCESS RIGHT  10/05/2019   MULTIPLE EXTRACTIONS WITH ALVEOLOPLASTY N/A 10/26/2019   Procedure: EXTRACTION OF TOOTH #'S 3, 5-11,19-28,  AND 32 WITH ALVEOLOPLASTY;  Surgeon: Charlynne Pander, DDS;  Location: [facility] OR;  Service: Oral Surgery;  Laterality: N/A;   RIGHT HEART CATH AND CORONARY ANGIOGRAPHY N/A 09/24/2019   Procedure: RIGHT HEART CATH AND CORONARY ANGIOGRAPHY;  Surgeon: Tonny Bollman, MD;  Location[facility] INVASIVE CV [facility];  Service:  Cardiovascular;  Laterality: N/A;   STUMP REVISION Left 10/30/2021   Procedure: REVISION LEFT BELOW KNEE AMPUTATION;  Surgeon: Nadara Mustard, MD;  Location: [facility] OR;  Service: Orthopedics;  Laterality: Left;   STUMP REVISION Left 12/25/2021   Procedure: REVISION LEFT BELOW KNEE AMPUTATION;  Surgeon: Nadara Mustard, MD;  Location: [facility] OR;  Service: Orthopedics;  Laterality: Left;   TEE WITHOUT CARDIOVERSION N/A 12/05/2019   Procedure: TRANSESOPHAGEAL ECHOCARDIOGRAM (TEE);  Surgeon: Purcell Nails, MD;  Location: [facility] OR;  Service: Open Heart Surgery;  Laterality: N/A;   TEE WITHOUT CARDIOVERSION N/A 09/01/2021   Procedure: TRANSESOPHAGEAL ECHOCARDIOGRAM (TEE);  Surgeon: Little Ishikawa, MD;  Location: [facility] ENDOSCOPY;  Service: Cardiovascular;  Laterality: N/A;   Social History:  reports that he has never smoked. He quit smokeless tobacco use about 3 years ago.  His smokeless tobacco use included chew. He reports current alcohol use of about 4.0 standard drinks of alcohol per week. He reports that he does not use drugs.  Family / Support Systems Marital Status: Single Patient Roles: Other (Comment) (nephew, employee, sibling) Other Supports: Linda-aunt [phone] Bill-uncle [phone] Crystal-cousin [phone] Anticipated Caregiver: Kateri Mc and cousin Ability/Limitations of Caregiver: Intermittent only pt will need to be mod/i by discharge Caregiver Availability: Intermittent Family Dynamics: Close with Aunt and Uncle along with cousin, all assist to make sure pt needs are met. Pt has been mod/i level and working some prior to this admission.  Social History Preferred language: English Religion: Baptist Cultural Background: No issues Education: HS Health Literacy - How often do you need to have someone help you when you read instructions, pamphlets, or other written material from your doctor or pharmacy?: Never Writes: Yes Employment Status: Employed Name of Employer: [employer]-part time Return to Work Plans: Unsure will need to heal and be using a prothesis lie he was prior to admission Legal History/Current Legal Issues: No issues Guardian/Conservator: None-according to MD pt is capable of making his own decisions while here. He is having pain issues which are limiting him in therapies   Abuse/Neglect Abuse/Neglect Assessment Can Be Completed: Yes Physical Abuse: Denies Verbal Abuse: Denies Sexual Abuse: Denies Exploitation of patient/patient's resources: Denies Self-Neglect: Denies  Patient response to: Social Isolation - How often do you feel lonely or isolated from those around you?: Never  Emotional Status Pt's affect, behavior and adjustment status: Pt is motivated to do well and recover from this amputation he was doing well until the infection and non-healing. He hopes to do well here. Has been here in 2022 and did well and recovered. Recent Psychosocial Issues: other health issues-was managing prior to infection Psychiatric History: Hx-depression/anxiety takes medications for this and finds it helpful. Have placed on neuro-psych list to be seen while here, due to feels down by all that has happened in this hospitalization Substance Abuse History: No issues  Patient / Family Perceptions, Expectations & Goals Pt/Family understanding of illness & functional limitations: Pt can explain his amputation  and voiced they tried to save his leg and kept cutting little by little. He does talk with the MD and feels he has a good understanding of his plan moving forward it just makes it harder without a knee joint. He will do his best here Premorbid pt/family roles/activities: Nephew, cousin, son, sibling, friend, employee. etc Anticipated changes in roles/activities/participation: resume Pt/family expectations/goals: Pt states: " I hope to do well and get back moving, I am having pain issues and it is making me sick so I know I need to go slow."  [employer]: Other (Comment) Premorbid Home Care/DME Agencies: Other (Comment) (has wc, rw, bsc, tub bench) Transportation available at discharge: self anf family Is the patient able to respond to transportation needs?: Yes In the past 12 months, has lack of transportation kept you from medical appointments or from getting medications?: No In the past 12 months, has lack of transportation kept you from meetings, work, or from getting things needed for daily living?: No Resource referrals recommended: Neuropsychology  Discharge Planning Living Arrangements: Other relatives Support Systems: Other relatives, Friends/neighbors Type of Residence: Private residence Insurance Resources: [employer] (specify county) (well care) Architect: Tree surgeon, Employment, Garment/textile technologist Screen Referred: No Living Expenses: Lives with family Money Management: Patient, Family Does the patient have any problems obtaining your medications?: No Home Management: both he and uncle Patient/Family Preliminary Plans: Return home with uncle who can be there at times but does leave. Pt will need to be mod/i level due to will be alone at times. He is used to using a wheelchair since past surgeries. He has been here before and knows the routine of CIR await evaluations Care Coordinator Barriers to Discharge: Decreased caregiver support, Weight, Lack of/limited family support, Insurance for SNF coverage Care Coordinator Anticipated Follow Up Needs: HH/OP  Clinical Impression Pleasant gentleman who is struggling with his pain issues with his new amputation. He is close with uncle whom he lives with and cousin who helps him out when needed. He was mod/I and working part time prior to this and hopes to get back to mod/I level, Will await team's evaluations and work on discharge needs. Placed on neuro-psych list to be seen this week  Mirren Gest, Lemar Livings 08/01/2023, 10:21 AM

## 2023-08-01 NOTE — Progress Notes (Signed)
Patient ID: Adam Long, male   DOB: 16-Feb-1976, 47 y.o.   MRN: [id number] Met with the patient to review current situation, rehab schedule, team conference and plan of care. Patient noted poor incision healing with cellulitis for some time; not worn prosthesis for +2 months. Wound on hip along with incision left leg; wound vac in place. Reviewed secondary risk management including DM (A1C 10/2); Libre at home; "strange readings at home" ; able to use regular glucometer at discharge. Reviewed CMM/HH diet and medications along with  HTN, HLD, PAD, and CAD. Patient noted pain is managed. He has DME at home including a RW, BSC, W/C. Continue to follow along to address educational needs to facilitate preparation for discharge. Pamelia Hoit

## 2023-08-01 NOTE — Progress Notes (Signed)
PHARMACY - ANTICOAGULATION CONSULT NOTE  Pharmacy Consult for Warfarin Indication: AVR replacement  No Known Allergies  Patient Measurements: Height: 5\' 10"  (177.8 cm) Weight: 132.1 kg (291 lb 3.6 oz) IBW/kg (Calculated) : 73 Heparin Dosing Weight: 113.5  Vital Signs: Temp: 98.1 F (36.7 C) (11/04 0521) Temp Source: Oral (11/04 0521) BP: 132/77 (11/04 0521) Pulse Rate: 77 (11/04 0521)  Labs: Recent Labs    07/30/23 0328 07/31/23 0315 08/01/23 0538  HGB 7.1* 7.4* 7.9*  HCT 24.4* 25.4* 27.7*  PLT 430* 475* 512*  LABPROT 19.1* 17.3* 16.8*  INR 1.6* 1.4* 1.3*  CREATININE 0.87 0.75 0.81    Estimated Creatinine Clearance: 154 mL/min (by C-G formula based on SCr of 0.81 mg/dL).  Assessment: 47 yo male admitted for L BKA stump infection. Patient is on warfarin PTA for history of mechanical AVR.  PTA warfarin regimen: 5mg  MWF, 10mg  all other days.   Warfarin resumed 10/31 after BKA, held x 2 days  INR 1.3 today. Will give high dose again until trending up. On lovenox bridge.   Goal of Therapy:  INR: 2-3 Anti-Xa: 0.6-1 Monitor platelets by anticoagulation protocol: Yes   Plan:  Warfarin 10 mg po x 1 Cont Lovenox 150mg  SQ BID F/u wt Monitor daily INR and CBC  [name], PharmD, BCIDP, AAHIVP, CPP Infectious Disease Pharmacist 08/01/2023 9:34 AM

## 2023-08-01 NOTE — Discharge Instructions (Addendum)
Inpatient Rehab Discharge Instructions  Adam Long Discharge date and time: No discharge date for patient encounter.   Activities/Precautions/ Functional Status: Activity: activity as tolerated Diet: diabetic diet Wound Care: Cleanse incision daily warm soap and water using Dial soap/apply dry dressing with 4 x 4 gauze and tape Functional status:  ___ No restrictions     ___ Walk up steps independently ___ 24/7 supervision/assistance   ___ Walk up steps with assistance ___ Intermittent supervision/assistance  ___ Bathe/dress independently ___ Walk with walker     _x__ Bathe/dress with assistance ___ Walk Independently    ___ Shower independently ___ Walk with assistance    ___ Shower with assistance ___ No alcohol     ___ Return to work/school ________  Special Instructions:  No driving smoking or alcohol  Follow-up prothrombin time with [facility]  [facility] phone [phone] fax [phone] one week   COMMUNITY REFERRALS UPON DISCHARGE:    Outpatient: PT             Agency:[facility] Phone: [phone]             Appointment Date/Time: WILL CALL PATIENT TO SET UP FOLLOW UP APPOINTMENTS  Medical Equipment/Items Ordered: [employer] HAS ALL OTHER PIECES OF EQUIPMENT FROM PAST ADMISSIONS                                                 Agency/Supplier:[facility] [employer]   [phone]   My questions have been answered and I understand these instructions. I will adhere to these goals and the provided educational materials after my discharge from the hospital.  Patient/Caregiver Signature _______________________________ Date __________  Clinician Signature _______________________________________ Date __________  Please bring this form and your medication list with you to all your follow-up doctor's appointments.   Information on my medicine - Coumadin   (Warfarin)  This medication education was reviewed with  me or my healthcare representative as part of my discharge preparation.  The pharmacist that spoke with me during my hospital stay was:  [name], RPH-CPP  Why was Coumadin prescribed for you? Coumadin was prescribed for you because you have a blood clot or a medical condition that can cause an increased risk of forming blood clots. Blood clots can cause serious health problems by blocking the flow of blood to the heart, lung, or brain. Coumadin can prevent harmful blood clots from forming. As a reminder your indication for Coumadin is:  Blood Clot Prevention after Heart Valve Surgery  What test will check on my response to Coumadin? While on Coumadin (warfarin) you will need to have an INR test regularly to ensure that your dose is keeping you in the desired range. The INR (international normalized ratio) number is calculated from the result of the laboratory test called prothrombin time (PT).  If an INR APPOINTMENT HAS NOT ALREADY BEEN MADE FOR YOU please schedule an appointment to have this lab work done by your health care provider within 7 days. Your INR goal is usually a number between:  2 to 3 or your provider may give you a more narrow range like 2-2.5.  Ask your health care provider during an office visit what your goal INR is.  What  do you need to  know  About  COUMADIN? Take Coumadin (warfarin) exactly as prescribed  by your healthcare provider about the same time each day.  DO NOT stop taking without talking to the doctor who prescribed the medication.  Stopping without other blood clot prevention medication to take the place of Coumadin may increase your risk of developing a new clot or stroke.  Get refills before you run out.  What do you do if you miss a dose? If you miss a dose, take it as soon as you remember on the same day then continue your regularly scheduled regimen the next day.  Do not take two doses of Coumadin at the same time.  Important Safety Information A possible  side effect of Coumadin (Warfarin) is an increased risk of bleeding. You should call your healthcare provider right away if you experience any of the following: Bleeding from an injury or your nose that does not stop. Unusual colored urine (red or dark brown) or unusual colored stools (red or black). Unusual bruising for unknown reasons. A serious fall or if you hit your head (even if there is no bleeding).  Some foods or medicines interact with Coumadin (warfarin) and might alter your response to warfarin. To help avoid this: Eat a balanced diet, maintaining a consistent amount of Vitamin K. Notify your provider about major diet changes you plan to make. Avoid alcohol or limit your intake to 1 drink for women and 2 drinks for men per day. (1 drink is 5 oz. wine, 12 oz. beer, or 1.5 oz. liquor.)  Make sure that ANY health care provider who prescribes medication for you knows that you are taking Coumadin (warfarin).  Also make sure the healthcare provider who is monitoring your Coumadin knows when you have started a new medication including herbals and non-prescription products.  Coumadin (Warfarin)  Major Drug Interactions  Increased Warfarin Effect Decreased Warfarin Effect  Alcohol (large quantities) Antibiotics (esp. Septra/Bactrim, Flagyl, Cipro) Amiodarone (Cordarone) Aspirin (ASA) Cimetidine (Tagamet) Megestrol (Megace) NSAIDs (ibuprofen, naproxen, etc.) Piroxicam (Feldene) Propafenone (Rythmol SR) Propranolol (Inderal) Isoniazid (INH) Posaconazole (Noxafil) Barbiturates (Phenobarbital) Carbamazepine (Tegretol) Chlordiazepoxide (Librium) Cholestyramine (Questran) Griseofulvin Oral Contraceptives Rifampin Sucralfate (Carafate) Vitamin K   Coumadin (Warfarin) Major Herbal Interactions  Increased Warfarin Effect Decreased Warfarin Effect  Garlic Ginseng Ginkgo biloba Coenzyme Q10 Green tea St. John's wort    Coumadin (Warfarin) FOOD Interactions  Eat a consistent  number of servings per week of foods HIGH in Vitamin K (1 serving =  cup)  Collards (cooked, or boiled & drained) Kale (cooked, or boiled & drained) Mustard greens (cooked, or boiled & drained) Parsley *serving size only =  cup Spinach (cooked, or boiled & drained) Swiss chard (cooked, or boiled & drained) Turnip greens (cooked, or boiled & drained)  Eat a consistent number of servings per week of foods MEDIUM-HIGH in Vitamin K (1 serving = 1 cup)  Asparagus (cooked, or boiled & drained) Broccoli (cooked, boiled & drained, or raw & chopped) Brussel sprouts (cooked, or boiled & drained) *serving size only =  cup Lettuce, raw (green leaf, endive, romaine) Spinach, raw Turnip greens, raw & chopped   These websites have more information on Coumadin (warfarin):  http://www.king-russell.com/; https://www.hines.net/;

## 2023-08-01 NOTE — Progress Notes (Signed)
Physical Therapy Session Note  Patient Details  Name: Adam Long MRN: [id number] Date of Birth: 1976/04/02  Today's Date: 08/01/2023 PT Individual Time: 1030-1044 PT Individual Time Calculation (min): 14 min  Today's Date: 08/01/2023 PT Missed Time: 16 Minutes Missed Time Reason: Pain  Short Term Goals: Week 1:  PT Short Term Goal 1 (Week 1): Pt will perform STS with mod a PT Short Term Goal 2 (Week 1): Pt will perform bed/chair transfer with mod a PT Short Term Goal 3 (Week 1): Pt will propel w/c >100 ft  Skilled Therapeutic Interventions/Progress Updates:   Received pt supine in bed and reported incisional pain rated 8/10 in L residual limb (predicated) and MD aware. Pt politely declined OOB mobility, requesting bed level exercises at this time. Pt performed L hip flexion 2x8 and L hip abduction 2x8 with increased time and decreased ROM - limited by pain. Educated pt on desensitization techniques during/after wound vac removal including rubbing, massage, and tapping for pain management. Also educated on contracture prevention and avoiding placing pillows under residual limb. Pt unable to participate any further due to pain but hopeful that pain will subside to progress OOB with OT this afternoon. Concluded session with pt semi-reclined in bed, needs within reach, and bed alarm. 16 minutes missed of skilled physical therapy due to pain.   Therapy Documentation Precautions:  Restrictions Weight Bearing Restrictions: Yes LLE Weight Bearing: Non weight bearing  Therapy/Group: Individual Therapy Marlana Salvage Zaunegger Blima Rich PT, DPT 08/01/2023, 6:54 AM

## 2023-08-01 NOTE — Progress Notes (Signed)
Inpatient Rehabilitation Center Individual Statement of Services  Patient Name:  Adam Long  Date:  08/01/2023  Welcome to the Inpatient Rehabilitation Center.  Our goal is to provide you with an individualized program based on your diagnosis and situation, designed to meet your specific needs.  With this comprehensive rehabilitation program, you will be expected to participate in at least 3 hours of rehabilitation therapies Monday-Friday, with modified therapy programming on the weekends.  Your rehabilitation program will include the following services:  Physical Therapy (PT), Occupational Therapy (OT), 24 hour per day rehabilitation nursing, Therapeutic Recreaction (TR), Neuropsychology, Care Coordinator, Rehabilitation Medicine, Nutrition Services, and Pharmacy Services  Weekly team conferences will be held on Wednesday to discuss your progress.  Your Inpatient Rehabilitation Care Coordinator will talk with you frequently to get your input and to update you on team discussions.  Team conferences with you and your family in attendance may also be held.  Expected length of stay: 14-16 days  Overall anticipated outcome: min-CGA level  Depending on your progress and recovery, your program may change. Your Inpatient Rehabilitation Care Coordinator will coordinate services and will keep you informed of any changes. Your Inpatient Rehabilitation Care Coordinator's name and contact numbers are listed  below.  The following services may also be recommended but are not provided by the Inpatient Rehabilitation Center:  Driving Evaluations Home Health Rehabiltiation Services Outpatient Rehabilitation Services Vocational Rehabilitation   Arrangements will be made to provide these services after discharge if needed.  Arrangements include referral to agencies that provide these services.  Your insurance has been verified to be:  [employer] Your primary doctor is:  Everlene Other  Pertinent  information will be shared with your doctor and your insurance company.  Inpatient Rehabilitation Care Coordinator:  Dossie Der, [place] [phone] or Luna Glasgow  Information discussed with and copy given to patient by: Lucy Chris, 08/01/2023, 10:32 AM

## 2023-08-01 NOTE — Plan of Care (Signed)
  Problem: RH Bathing Goal: LTG Patient will bathe all body parts with assist levels (OT) Description: LTG: Patient will bathe all body parts with assist levels (OT) Flowsheets (Taken 08/01/2023 1558) LTG: Pt will perform bathing with assistance level/cueing: Minimal Assistance - Patient > 75%   Problem: RH Dressing Goal: LTG Patient will perform lower body dressing w/assist (OT) Description: LTG: Patient will perform lower body dressing with assist, with/without cues in positioning using equipment (OT) Flowsheets (Taken 08/01/2023 1558) LTG: Pt will perform lower body dressing with assistance level of: Minimal Assistance - Patient > 75%   Problem: RH Toileting Goal: LTG Patient will perform toileting task (3/3 steps) with assistance level (OT) Description: LTG: Patient will perform toileting task (3/3 steps) with assistance level (OT)  Flowsheets (Taken 08/01/2023 1558) LTG: Pt will perform toileting task (3/3 steps) with assistance level: Minimal Assistance - Patient > 75%   Problem: RH Simple Meal Prep Goal: LTG Patient will perform simple meal prep w/assist (OT) Description: LTG: Patient will perform simple meal prep with assistance, with/without cues (OT). Flowsheets (Taken 08/01/2023 1558) LTG: Pt will perform simple meal prep with assistance level of: Independent with assistive device LTG: Pt will perform simple meal prep w/level of: Wheelchair level   Problem: RH Toilet Transfers Goal: LTG Patient will perform toilet transfers w/assist (OT) Description: LTG: Patient will perform toilet transfers with assist, with/without cues using equipment (OT) Flowsheets (Taken 08/01/2023 1558) LTG: Pt will perform toilet transfers with assistance level of: Contact Guard/Touching assist   Problem: RH Tub/Shower Transfers Goal: LTG Patient will perform tub/shower transfers w/assist (OT) Description: LTG: Patient will perform tub/shower transfers with assist, with/without cues using equipment  (OT) Flowsheets (Taken 08/01/2023 1558) LTG: Pt will perform tub/shower stall transfers with assistance level of: Contact Guard/Touching assist

## 2023-08-01 NOTE — Evaluation (Signed)
Occupational Therapy Assessment and Plan  Patient Details  Name: Adam Long MRN: [id number] Date of Birth: 1975-12-31  OT Diagnosis: acute pain, muscle weakness (generalized), and decreased activity tolerance Rehab Potential: Rehab Potential (ACUTE ONLY): Good ELOS: 14-16 days   Today's Date: 08/01/2023 OT Individual Time: 4403-4742 OT Individual Time Calculation (min): 33 min  and Today's Date: 08/01/2023 OT Missed Time: 30 Minutes Missed Time Reason: Pain    Hospital Problem: Principal Problem:   Above knee amputation of left lower extremity (HCC)   Past Medical History:  Past Medical History:  Diagnosis Date   Anxiety    Aortic stenosis    Cellulitis and abscess of lower extremity 06/11/2019   Cellulitis of fourth toe of left foot    Cholelithiasis    Coronary artery disease    Nonobstructive CAD (40-50% LAD) 08/2019   Depression    Diabetes mellitus without complication (HCC)    Phreesia 09/27/2020   Elevated troponin level not due myocardial infarction 11/11/2019   Essential hypertension    Gangrene of toe of left foot (HCC) 07/06/2019   Heart murmur    Phreesia 09/27/2020   Hyperlipidemia    Phreesia 09/27/2020   Hypertension    Phreesia 09/27/2020   Mixed hyperlipidemia    Morbid obesity (HCC)    S/P aortic valve replacement with mechanical valve 12/05/2019   25 mm Carbomedics top hat bileaflet mechanical valve via partial upper hemi-sternotomy   Severe aortic stenosis 09/24/2019   Type 2 diabetes mellitus (HCC)    Past Surgical History:  Past Surgical History:  Procedure Laterality Date   ABDOMINAL AORTOGRAM W/LOWER EXTREMITY N/A 07/06/2019   Procedure: ABDOMINAL AORTOGRAM W/LOWER EXTREMITY;  Surgeon: Sherren Kerns, MD;  Location: [facility];  Service: Cardiovascular;  Laterality: N/A;  Bilateral   AMPUTATION Left 07/09/2019   Procedure: LEFT FOURTH and Fifth TOE AMPUTATION.;  Surgeon: Larina Earthly, MD;  Location: [facility] OR;  Service: Vascular;   Laterality: Left;   AMPUTATION Left 03/11/2021   Procedure: LEFT FOOT 5TH  AND 4TH RAY AMPUTATION;  Surgeon: Nadara Mustard, MD;  Location: [facility] OR;  Service: Orthopedics;  Laterality: Left;   AMPUTATION Left 08/28/2021   Procedure: AMPUTATION BELOW KNEE;  Surgeon: Nadara Mustard, MD;  Location: [facility] OR;  Service: Orthopedics;  Laterality: Left;   AMPUTATION Left 01/29/2023   Procedure: LEFT AMPUTATION BELOW KNEE REVISION AND CLOSURE;  Surgeon: Nadara Mustard, MD;  Location: [facility] OR;  Service: Orthopedics;  Laterality: Left;   AMPUTATION Left 07/27/2023   Procedure: LEFT ABOVE KNEE AMPUTATION;  Surgeon: Nadara Mustard, MD;  Location: [facility] OR;  Service: Orthopedics;  Laterality: Left;   AORTIC VALVE REPLACEMENT N/A 12/05/2019   Procedure: PARTIAL STERNOTOMY FOR AORTIC VALVE REPLACEMENT (AVR), USING CARBOMEDICS SUPRA-ANNULAR TOP HAT ;  Surgeon: Purcell Nails, MD;  Location: [facility] OR;  Service: Open Heart Surgery;  Laterality: N/A;  No neck lines on left   APPLICATION OF WOUND VAC Left 10/30/2021   Procedure: APPLICATION OF WOUND VAC;  Surgeon: Nadara Mustard, MD;  Location: [facility] OR;  Service: Orthopedics;  Laterality: Left;   APPLICATION OF WOUND VAC Left 12/25/2021   Procedure: APPLICATION OF WOUND VAC;  Surgeon: Nadara Mustard, MD;  Location: [facility] OR;  Service: Orthopedics;  Laterality: Left;   APPLICATION OF WOUND VAC Left 01/26/2023   Procedure: APPLICATION OF WOUND VAC TO LEFT STUMP;  Surgeon: Tarry Kos, MD;  Location: [facility] OR;  Service: Orthopedics;  Laterality: Left;  CARDIAC VALVE REPLACEMENT N/A    Phreesia 09/27/2020   I & D EXTREMITY Left 01/26/2023   Procedure: IRRIGATION AND DEBRIDEMENT OF LEFT BKA STUMP;  Surgeon: Tarry Kos, MD;  Location: [facility] OR;  Service: Orthopedics;  Laterality: Left;   IR RADIOLOGY PERIPHERAL GUIDED IV START  10/05/2019   IR US GUIDE VASC ACCESS RIGHT  10/05/2019   MULTIPLE EXTRACTIONS WITH ALVEOLOPLASTY N/A 10/26/2019   Procedure: EXTRACTION OF TOOTH #'S 3, 5-11,19-28,  AND  32 WITH ALVEOLOPLASTY;  Surgeon: Charlynne Pander, DDS;  Location: [facility] OR;  Service: Oral Surgery;  Laterality: N/A;   RIGHT HEART CATH AND CORONARY ANGIOGRAPHY N/A 09/24/2019   Procedure: RIGHT HEART CATH AND CORONARY ANGIOGRAPHY;  Surgeon: Tonny Bollman, MD;  Location: [facility]VAS[facility] CV LAB;  Service: Cardiovascular;  Laterality: N/A;   STUMP REVISION Left 10/30/2021   Procedure: REVISION LEFT BELOW KNEE AMPUTATION;  Surgeon: Nadara Mustard, MD;  Location: [facility] OR;  Service: Orthopedics;  Laterality: Left;   STUMP REVISION Left 12/25/2021   Procedure: REVISION LEFT BELOW KNEE AMPUTATION;  Surgeon: Nadara Mustard, MD;  Location: [facility] OR;  Service: Orthopedics;  Laterality: Left;   TEE WITHOUT CARDIOVERSION N/A 12/05/2019   Procedure: TRANSESOPHAGEAL ECHOCARDIOGRAM (TEE);  Surgeon: Purcell Nails, MD;  Location: [facility] OR;  Service: Open Heart Surgery;  Laterality: N/A;   TEE WITHOUT CARDIOVERSION N/A 09/01/2021   Procedure: TRANSESOPHAGEAL ECHOCARDIOGRAM (TEE);  Surgeon: Little Ishikawa, MD;  Location: [facility] ENDOSCOPY;  Service: Cardiovascular;  Laterality: N/A;    Assessment & Plan Clinical Impression: Patient is a 47 y.o. male with past medical history of hypertension, hyperlipidemia, CAD, s/p AVR on Coumadin, diabetes mellitus with A1c 10.2, PAD with history of left BKA, anxiety, depression, and morbid obesity.  Patient had multiple residual revisions in the past (last revision May 2024 due to dehiscence), presented to the ED on 07/25/23 with purulent drainage and wound dehiscence of his left BKA. Xray showed diffuse soft tissue swelling c/w cellulitis but no acute osteomyelitis, labs without leukocytosis, Hgb 8.1 near baseline, sodium 128 (baseline 130s), glucose 293, lactic WNL x2, Urine with ?UTI vs klebsiella pneumoniae colonization treated with IV abx x4 days, Blood Cx neg x2. Ultimately he required left AKA completed by Dr. Lajoyce Corners on 07/27/2023 with Prevena wound VAC applied.  He completed 4 days of  rocephin and 3 days of flagyl, d/c'd abx on 07/28/23.  Treated for acute on chronic hyponatremia. Hemoglobin 6.6 on 07/29/23, transfused 1U PRBC, up to 7.4 on 07/31/23, to be transfused again if <7. INR subtherapeutic, being bridged with lovenox.  Patient has been evaluated by physical therapy and found to be mod assist for bed mobility and mod assist for lateral scoots along edge of bed.  He has been seen by Occupational Therapy and he requires mod assist for lower body dressing, max assist for lower body bathing. The patient requires inpatient physical medicine and rehabilitation evaluations and treatment secondary to L AKA.   Patient transferred to CIR on 07/31/2023 .    Patient currently requires mod-max with lower body basic self-care skills secondary to muscle weakness and increased pain at residual limb, decreased cardiorespiratoy endurance, and decreased standing balance and decreased balance strategies.  Prior to hospitalization, patient could complete BADLs/IADLs with Mod I.  Patient will benefit from skilled intervention to decrease level of assist with basic self-care skills and increase independence with basic self-care skills prior to discharge home with care partner.  Anticipate patient will require minimal physical assistance and  follow up home health.  OT - End of Session Activity Tolerance: Tolerates < 10 min activity, no significant change in vital signs Endurance Deficit: Yes Endurance Deficit Description: mild light headedness when sitting up, vitals stable OT Assessment Rehab Potential (ACUTE ONLY): Good OT Barriers to Discharge: Wound Care;Lack of/limited family support;Weight;Weight bearing restrictions OT Patient demonstrates impairments in the following area(s): Balance;Edema;Endurance;Pain;Perception;Safety;Skin Integrity;Sensory OT Basic ADL's Functional Problem(s): Bathing;Dressing;Toileting OT Advanced ADL's Functional Problem(s): Simple Meal Preparation OT Transfers  Functional Problem(s): Toilet;Tub/Shower OT Plan OT Intensity: Minimum of 1-2 x/day, 45 to 90 minutes OT Frequency: 5 out of 7 days OT Duration/Estimated Length of Stay: 14-16 days OT Treatment/Interventions: Metallurgist training;Community reintegration;Discharge planning;Disease mangement/prevention;Functional electrical stimulation;Functional mobility training;Pain management;Patient/family education;DME/adaptive equipment instruction;Psychosocial support;Neuromuscular re-education;Self Care/advanced ADL retraining;Skin care/wound managment;Splinting/orthotics;Therapeutic Activities;Therapeutic Exercise;UE/LE Strength taining/ROM;UE/LE Coordination activities;Visual/perceptual remediation/compensation;Wheelchair propulsion/positioning OT Basic Self-Care Anticipated Outcome(s): Min A OT Toileting Anticipated Outcome(s): Min A OT Bathroom Transfers Anticipated Outcome(s): CGA OT Recommendation Patient destination: Home Follow Up Recommendations: Home health OT Equipment Recommended: To be determined   OT Evaluation Precautions/Restrictions  Precautions Precautions: Fall Restrictions Weight Bearing Restrictions: Yes LLE Weight Bearing: Non weight bearing General Chart Reviewed: Yes PT Missed Treatment Reason: Pain Family/Caregiver Present: No Vital Signs   Pain Pain Assessment Pain Scale: 0-10 Pain Score: 8  Pain Type: Acute pain;Surgical pain Pain Location: Leg Pain Orientation: Left Pain Descriptors / Indicators: Throbbing;Burning Pain Intervention(s): Medication (See eMAR);Rest Home Living/Prior Functioning Home Living Family/patient expects to be discharged to:: Private residence Living Arrangements: Other relatives Available Help at Discharge: Family, Available PRN/intermittently Type of Home: House Home Access: Stairs to enter [employer] of Steps: 2 short steps Entrance Stairs-Rails: None Home Layout: Two level, Able to live on main level with  bedroom/bathroom Alternate Level Stairs-Number of Steps: Flight Bathroom Shower/Tub: Tub/shower unit, Curtain (No shower seat or grab bars, detachable shower head.) Bathroom Toilet: Handicapped height (BSC over toilet) Bathroom Accessibility: Yes Additional Comments: uncle, cousin also helps pt  Lives With: Other (Comment) Civil engineer, contracting & cousin) IADL History Homemaking Responsibilities: Yes Meal Prep Responsibility: Primary Current License: Yes Occupation: Part time employment Prior Function Level of Independence: Independent with homemaking with ambulation, Independent with homemaking with wheelchair, Requires assistive device for independence, Independent with gait, Independent with transfers (Intermediately using RW & WC.)  Able to Take Stairs?: Yes Driving: Yes Vocation: Part time employment Vision Baseline Vision/History: 1 Wears glasses Ability to See in Adequate Light: 0 Adequate Patient Visual Report: No change from baseline Vision Assessment?: No apparent visual deficits Perception  Perception: Within Functional Limits Praxis Praxis: WFL Cognition Cognition Overall Cognitive Status: Within Functional Limits for tasks assessed Arousal/Alertness: Awake/alert Memory: Appears intact Awareness: Appears intact Problem Solving: Appears intact Safety/Judgment: Appears intact Brief Interview for Mental Status (BIMS) Repetition of Three Words (First Attempt): 3 Temporal Orientation: Year: Correct Temporal Orientation: Month: Accurate within 5 days Temporal Orientation: Day: Correct Recall: "Sock": Yes, no cue required Recall: "Blue": Yes, no cue required Recall: "Bed": Yes, no cue required BIMS Summary Score: 15 Sensation Sensation Light Touch: Impaired Detail Peripheral sensation comments: Deficits due to L AKA Light Touch Impaired Details: Impaired LLE Hot/Cold: Not tested Proprioception: Not tested Stereognosis: Not tested Coordination Gross Motor Movements are Fluid  and Coordinated: No Fine Motor Movements are Fluid and Coordinated: Yes Coordination and Movement Description: Deficits due to generalized weakness and recent L AKA. Motor  Motor Motor: Within Functional Limits Motor - Skilled Clinical Observations: WFL, but limited by pain.  Trunk/Postural Assessment  Cervical Assessment Cervical Assessment:  Within Functional Limits Thoracic Assessment Thoracic Assessment: Within Functional Limits Lumbar Assessment Lumbar Assessment: Within Functional Limits Postural Control Postural Control: Within Functional Limits  Balance Balance Balance Assessed: Yes Static Sitting Balance Static Sitting - Balance Support: Feet supported Static Sitting - Level of Assistance: 5: Stand by assistance Dynamic Sitting Balance Dynamic Sitting - Balance Support: Feet supported Dynamic Sitting - Level of Assistance: 5: Stand by assistance Extremity/Trunk Assessment RUE Assessment RUE Assessment: Within Functional Limits LUE Assessment LUE Assessment: Within Functional Limits  Care Tool Care Tool Self Care Eating   Eating Assist Level: Independent    Oral Care    Oral Care Assist Level: Set up assist    Bathing   Body parts bathed by patient: Right arm;Left arm;Chest;Abdomen;Front perineal area;Right upper leg;Face Body parts bathed by helper: Buttocks;Right lower leg;Left upper leg Body parts n/a: Left lower leg Assist Level: Moderate Assistance - Patient 50 - 74%    Upper Body Dressing(including orthotics)   What is the patient wearing?: Pull over shirt   Assist Level: Set up assist    Lower Body Dressing (excluding footwear)   What is the patient wearing?:  (Shorts) Assist for lower body dressing: Maximal Assistance - Patient 25 - 49%    Putting on/Taking off footwear   What is the patient wearing?: Socks Assist for footwear: Dependent - Patient 0%       Care Tool Toileting Toileting activity Toileting Activity did not occur (Clothing  management and hygiene only): N/A (no void or bm)       Care Tool Bed Mobility Roll left and right activity   Roll left and right assist level: Moderate Assistance - Patient 50 - 74%    Sit to lying activity   Sit to lying assist level: Minimal Assistance - Patient > 75%    Lying to sitting on side of bed activity   Lying to sitting on side of bed assist level: the ability to move from lying on the back to sitting on the side of the bed with no back support.: Contact Guard/Touching assist     Care Tool Transfers Sit to stand transfer Sit to stand activity did not occur: Safety/medical concerns      Chair/bed transfer Chair/bed transfer activity did not occur: Safety/medical concerns       Toilet transfer Toilet transfer activity did not occur: Safety/medical concerns       Care Tool Cognition  Expression of Ideas and Wants Expression of Ideas and Wants: 4. Without difficulty (complex and basic) - expresses complex messages without difficulty and with speech that is clear and easy to understand  Understanding Verbal and Non-Verbal Content Understanding Verbal and Non-Verbal Content: 4. Understands (complex and basic) - clear comprehension without cues or repetitions   Memory/Recall Ability Memory/Recall Ability : Current season;That he or she is in a hospital/hospital unit   Refer to Care Plan for Long Term Goals  SHORT TERM GOAL WEEK 1 OT Short Term Goal 1 (Week 1): Pt will perform SB transfer with Mod A x1 to access BSC/WC. OT Short Term Goal 2 (Week 1): Pt will complete LB dressing with Mod A. OT Short Term Goal 3 (Week 1): Pt will complete 3/3 toileting activities with Mod A.  Recommendations for other services: None    Skilled Therapeutic Intervention ADL ADL Eating: Independent Where Assessed-Eating: Bed level Grooming: Setup Where Assessed-Grooming: Bed level Upper Body Bathing: Setup Where Assessed-Upper Body Bathing: Bed level Lower Body Bathing: Moderate  assistance Where Assessed-Lower Body  Bathing: Bed level Upper Body Dressing: Setup Where Assessed-Upper Body Dressing: Bed level Lower Body Dressing: Moderate assistance;Maximal assistance Where Assessed-Lower Body Dressing: Bed level Toileting: Maximal assistance Where Assessed-Toileting: Bed level Toilet Transfer: Unable to assess Tub/Shower Transfer: Unable to assess ADL Comments: Pt declines OOB activity at eval, levels of assitance based on performance during Pt evaluation and clinical judgement. Mobility  Bed Mobility Bed Mobility: Rolling Right;Rolling Left;Sit to Supine;Supine to Sit Rolling Right: Moderate Assistance - Patient 50-74% Rolling Left: Contact Guard/Touching assist Supine to Sit: Contact Guard/Touching assist Sit to Supine: Minimal Assistance - Patient > 75%  Skilled Intervention: Pt received resting in bed for skilled OT session with focus on comprehensive OT evaluation. Pt agreeable to interventions, demonstrating overall pleasant mood. Pt details above, OT offering intermediate rest breaks and positioning suggestions throughout session to address pain/fatigue and maximize participation/safety in session.  Session began with introduction to OT role, OT POC, and general orientation to rehab unit/schedule. Appropriately sized DME retrieved, including slide-board, as patient deferred all OOB due to increased residual limb pain. Pt missing ~30 mins of skilled intervention.   Pt remained resting in bed with all immediate needs met at end of session. Pt continues to be appropriate for skilled OT intervention to promote further functional independence.   Discharge Criteria: Patient will be discharged from OT if patient refuses treatment 3 consecutive times without medical reason, if treatment goals not met, if there is a change in medical status, if patient makes no progress towards goals or if patient is discharged from hospital.  The above assessment, treatment plan,  treatment alternatives and goals were discussed and mutually agreed upon: by patient  Lou Cal, OTR/L, MSOT  08/01/2023, 1:40 PM

## 2023-08-01 NOTE — Consult Note (Signed)
WOC Nurse Consult Note: Reason for Consult:LEft AKA (several revisions) with PREVENA in place.  VAC ordered to remain in place for one week after surgery 07/27/23.  Today, nursing reports dressing is soiled.  Dr Lajoyce Corners made aware as this will be the first post op dressing change.  Please reconsult if needed further.  Wound type:surgical  Pressure Injury POA: NA Measurement: not assessed  VAC dressing in place Will not follow at this time.  Please re-consult if needed.  [name] Gip MSN, RN, FNP-BC CWON Wound, Ostomy, Continence Nurse Outpatient [facility] [phone] Pager [phone]

## 2023-08-01 NOTE — Progress Notes (Signed)
Inpatient Rehabilitation  Patient information reviewed and entered into eRehab system by Oyuki Hogan M. Eri Mcevers, M.A., CCC/SLP, PPS Coordinator.  Information including medical coding, functional ability and quality indicators will be reviewed and updated through discharge.    

## 2023-08-01 NOTE — Progress Notes (Addendum)
PROGRESS NOTE   Subjective/Complaints: Patient reports he is having some pain around the incision of his residual limb.   It appears that later Dr. Lajoyce Corners was notified today as patient had some bleeding around his VAC site.   ROS: Patient denies fever, rash, sore throat, blurred vision, dizziness, nausea, vomiting, diarrhea, cough, shortness of breath or chest pain,  headache, or mood change.  Objective:   No results found. Recent Labs    07/31/23 0315 08/01/23 0538  WBC 9.3 10.1  HGB 7.4* 7.9*  HCT 25.4* 27.7*  PLT 475* 512*   Recent Labs    07/31/23 0315 08/01/23 0538  NA 132* 131*  K 4.1 4.4  CL 99 96*  CO2 24 28  GLUCOSE 289* 201*  BUN 18 15  CREATININE 0.75 0.81  CALCIUM 8.8* 9.2    Intake/Output Summary (Last 24 hours) at 08/01/2023 1314 Last data filed at 08/01/2023 0900 Gross per 24 hour  Intake 360 ml  Output 2150 ml  Net -1790 ml        Physical Exam: Vital Signs Blood pressure 132/77, pulse 77, temperature 98.1 F (36.7 C), temperature source Oral, resp. rate 18, height 5\' 10"  (1.778 m), weight (!) 159 kg, SpO2 97%.   General: No acute distress, lying in bed HEENT: Head is normocephalic, atraumatic, MMM Heart: Reg rate and rhythm.  Chest: CTA bilaterally without wheezes, rales, or rhonchi; no distress Abdomen: Soft, non-tender, protuberant, bowel sounds positive Extremities: No clubbing, cyanosis, or edema. Pulses are 2+ Psych: Pt's affect is appropriate. Pt is cooperative Skin: Clean and intact without signs of breakdown Neuro:  Mental Status: He is alert and oriented to person, place, and time. Mental status is at baseline.     Comments: Decreased to light touch in RLE form knee downwards  Musculoskeletal:   Cervical back: Neck supple. No tenderness.     Comments: Ues 5/5 in biceps, triceps, WE, grip and FA B/L RLE- 5-/5 in HF, KE, DF and PF LLE- 2-/5 in HF L AKA with wound VAC in place   Skin:    General: Skin is warm and dry.     Comments: Venous stasis changes on RLE from mid calf downwards- and 1-2+ LE edema from R ankle VAC on LLE- with ~ 100cc in cannister of sanguinous drainage   Assessment/Plan: 1. Functional deficits which require 3+ hours per day of interdisciplinary therapy in a comprehensive inpatient rehab setting. Physiatrist is providing close team supervision and 24 hour management of active medical problems listed below. Physiatrist and rehab team continue to assess barriers to discharge/monitor patient progress toward functional and medical goals  Care Tool:  Bathing              Bathing assist       Upper Body Dressing/Undressing Upper body dressing        Upper body assist      Lower Body Dressing/Undressing Lower body dressing            Lower body assist       Toileting Toileting    Toileting assist       Transfers Chair/bed transfer  Transfers assist  Chair/bed transfer  activity did not occur: Safety/medical concerns        Locomotion Ambulation   Ambulation assist   Ambulation activity did not occur: Safety/medical concerns          Walk 10 feet activity   Assist  Walk 10 feet activity did not occur: Safety/medical concerns        Walk 50 feet activity   Assist Walk 50 feet with 2 turns activity did not occur: Safety/medical concerns         Walk 150 feet activity   Assist Walk 150 feet activity did not occur: Safety/medical concerns         Walk 10 feet on uneven surface  activity   Assist Walk 10 feet on uneven surfaces activity did not occur: Safety/medical concerns         Wheelchair     Assist Is the patient using a wheelchair?: Yes Type of Wheelchair: Manual Wheelchair activity did not occur: Safety/medical concerns         Wheelchair 50 feet with 2 turns activity    Assist    Wheelchair 50 feet with 2 turns activity did not occur: Safety/medical  concerns       Wheelchair 150 feet activity     Assist  Wheelchair 150 feet activity did not occur: Safety/medical concerns       Blood pressure 132/77, pulse 77, temperature 98.1 F (36.7 C), temperature source Oral, resp. rate 18, height 5\' 10"  (1.778 m), weight (!) 159 kg, SpO2 97%.  Medical Problem List and Plan: 1. 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  Antithrombotics: -DVT/anticoagulation:  Pharmaceutical: Lovenox 150 mg BID until Coumadin/INR therapeutic             -antiplatelet therapy: n/a 3. Pain Management: chronic pain and neuropathy-  Con't tylenol prn, Oxycodone 5-15 mg q4 hours prn and gabapentin  scheduled- as well as Robaxin prn  -11/4 increase gabapentin to 600 mg 3 times daily from twice daily 4. Mood/Behavior/Sleep/Anxiety/Depression:              -Continue sertraline 50mg  daily and Gabapentin 600mg  BID             Also team milieu to help with mood and anxiety -antipsychotic agents: n/a 5. Neuropsych/cognition: This patient is capable of making decisions on his own behalf. 6. Skin/Wound Care: has VAC -for 7 days from 07/27/23 7. Fluids/Electrolytes/Nutrition/chronic hyponatremia: current Na 132- will recheck in AM             -Continue vitamins and supplements 8. Diabetes type 2 with polyneuropathy, poorly controlled, Hgb A1C 10.2: SSI, increased semglee to 35U on d/c from acute, metformin to be resumed upon admission to CIR 1000 mg (home dose)  BID-AC  -11/4 CBGs overall stable, monitor response to medication change  CBG (last 3)  Recent Labs    07/31/23 2105 08/01/23 0609 08/01/23 1121  GLUCAP 190* 194* 217*    9. Acute on chronic anemia: Hgb 7.4 after 1U PRBCs on 11/1, transfuse if <7  -11/4 patient was noted to have some slight bleeding around his backside.  Overall  hemoglobin stable at 7.9 today. 10. HTN: continue metoprolol 25mg  BID and lisinopril 20mg  daily  -  11/4 well-controlled overall    08/01/2023    7:48 AM 08/01/2023    5:21 AM 07/31/2023    7:38 PM  Vitals with BMI  Weight 350 lbs 9 oz    BMI 50.3    Systolic  132 123  Diastolic  77 74  Pulse  77 90     11. HLD: continue zetia 10mg  daily 12. CAD and mechanical aortic valve: on coumadin resumed on 07/28/23, pharmacy following, bridging with lovenox 13. GERD: continue protonix 40mg  daily 14. Morbid obesity: provide dietary education 15. Thrombocytosis: likely reactive, recheck labs in AM and monitor 16. MRSA positive nasal swab: mupirocin BID 17. L BKA stump infection- done with IV ABX since amputation removed infection. .  18. Microscopic hematuria- has nonobstructive renal stones- monitor clinically.  19. GERD-con't PPI  LOS: 1 days A FACE TO FACE EVALUATION WAS PERFORMED  Fanny Dance 08/01/2023, 1:14 PM

## 2023-08-02 DIAGNOSIS — M792 Neuralgia and neuritis, unspecified: Secondary | ICD-10-CM | POA: Diagnosis not present

## 2023-08-02 DIAGNOSIS — I10 Essential (primary) hypertension: Secondary | ICD-10-CM | POA: Diagnosis not present

## 2023-08-02 DIAGNOSIS — E1142 Type 2 diabetes mellitus with diabetic polyneuropathy: Secondary | ICD-10-CM | POA: Diagnosis not present

## 2023-08-02 DIAGNOSIS — S78112A Complete traumatic amputation at level between left hip and knee, initial encounter: Secondary | ICD-10-CM | POA: Diagnosis not present

## 2023-08-02 LAB — PROTIME-INR
INR: 1.5 — ABNORMAL HIGH (ref 0.8–1.2)
Prothrombin Time: 18.3 s — ABNORMAL HIGH (ref 11.4–15.2)

## 2023-08-02 LAB — GLUCOSE, CAPILLARY
Glucose-Capillary: 205 mg/dL — ABNORMAL HIGH (ref 70–99)
Glucose-Capillary: 236 mg/dL — ABNORMAL HIGH (ref 70–99)
Glucose-Capillary: 181 mg/dL — ABNORMAL HIGH (ref 70–99)
Glucose-Capillary: 184 mg/dL — ABNORMAL HIGH (ref 70–99)

## 2023-08-02 MED ORDER — INSULIN GLARGINE-YFGN 100 UNIT/ML ~~LOC~~ SOLN
38.0000 [IU] | Freq: Every day | SUBCUTANEOUS | Status: DC
  Administered 2023-08-03 – 2023-08-17 (×15): 38 [IU] via SUBCUTANEOUS
  Filled 2023-08-02 (×15): qty 0.38

## 2023-08-02 MED ORDER — MUPIROCIN 2 % EX OINT
1.0000 | TOPICAL_OINTMENT | Freq: Two times a day (BID) | CUTANEOUS | Status: AC
Start: 2023-08-02 — End: 2023-08-06
  Administered 2023-08-02 – 2023-08-06 (×10): 1 via NASAL
  Filled 2023-08-02: qty 22

## 2023-08-02 MED ORDER — WARFARIN SODIUM 2.5 MG PO TABS
10.0000 mg | ORAL_TABLET | Freq: Once | ORAL | Status: AC
  Administered 2023-08-02: 10 mg via ORAL
  Filled 2023-08-02: qty 4

## 2023-08-02 MED ORDER — INSULIN GLARGINE-YFGN 100 UNIT/ML ~~LOC~~ SOLN
3.0000 [IU] | Freq: Once | SUBCUTANEOUS | Status: AC
  Administered 2023-08-02: 3 [IU] via SUBCUTANEOUS
  Filled 2023-08-02: qty 0.03

## 2023-08-02 MED ORDER — CHLORHEXIDINE GLUCONATE CLOTH 2 % EX PADS
6.0000 | MEDICATED_PAD | Freq: Every day | CUTANEOUS | Status: AC
Start: 2023-08-02 — End: 2023-08-07
  Administered 2023-08-02: 6 via TOPICAL

## 2023-08-02 NOTE — Progress Notes (Signed)
Occupational Therapy Session Note  Patient Details  Name: Adam Long MRN: [id number] Date of Birth: 1976/05/08  Today's Date: 08/02/2023 OT Individual Time: 4098-1191 OT Individual Time Calculation (min): 55 min    Short Term Goals: Week 1:  OT Short Term Goal 1 (Week 1): Pt will perform SB transfer with Mod A x1 to access BSC/WC. OT Short Term Goal 2 (Week 1): Pt will complete LB dressing with Mod A. OT Short Term Goal 3 (Week 1): Pt will complete 3/3 toileting activities with Mod A.  Skilled Therapeutic Interventions/Progress Updates:  Pt greeted resting in bed for skilled OT session with focus on BADL participation and functional transfers.   Pain: Pt reported 6-7/10 pain in residual limb, pre-medicated. OT offering intermediate rest breaks and positioning suggestions throughout session to address pain/fatigue and maximize participation/safety in session.   Functional Transfers: Pt comes to EOB with increased time for pain management, HOB elevated ~20 degrees, overall supervision. Boosting towards [facility] with cuing for technique + heavy use of bed features. Pt declines SB transfer this session due to pain, bariatric sara stedy as alternative form of transfer. Pt defers stating "I'll do it tomorrow."   Self Care Tasks: At EOB, pt shaves and completes UB bathing/dressing with setup/supervision.   Therapeutic Exercise: At bed level, pt completes 3x10 reps of chest press, bicep curls, and overhead press. Multimodal cuing provided for correct form with 4# dowel bar.    Pt remained resting in bed with 4Ps assessed and immediate needs met. Pt continues to be appropriate for skilled OT intervention to promote further functional independence in ADLs/IADLs.  Therapy Documentation Precautions:  Precautions Precautions: Fall Precaution Comments: wound vac L residual limb Restrictions Weight Bearing Restrictions: Yes LLE Weight Bearing: Non weight bearing   Therapy/Group: Individual  Therapy  Lou Cal, OTR/L, MSOT  08/02/2023, 6:08 AM

## 2023-08-02 NOTE — Progress Notes (Addendum)
Physical Therapy Session Note  Patient Details  Name: Adam Long MRN: [id number] Date of Birth: 1976-09-23  Today's Date: 08/02/2023 PT Individual Time: 0800-0916, 1435-1530 PT Individual Time Calculation (min): 76 min, 55 min    Short Term Goals: Week 1:  PT Short Term Goal 1 (Week 1): Pt will perform STS with mod a PT Short Term Goal 2 (Week 1): Pt will perform bed/chair transfer with mod a PT Short Term Goal 3 (Week 1): Pt will propel w/c >100 ft  Skilled Therapeutic Interventions/Progress Updates:      Treatment Session 1  Pt supine in bed upon arrival. Pt agreeable to therapy. Pt reports 7/10 L residual pain, premedicated.   Education provided for desensitization technique, pt reports pain decreased to 6/10.   Discussed pt equipment at home: WC, RW, and BSC. Discussed pt 2 steps for home entry, pt reports he was previously hopping and using L knee. Discussed possibility of installing a ramp, pt reports his uncle would be able to build one or he could purchase a temporary ramp.  Pt expressed interest in getting a electric wheelchair. Discussed insurance coverage is typically for electric wheelchair or prosthesis, not both. Pt reports preference for prosthesis. Pt reports he will likely purchase electric wheelchair OOP on [employer] for ~700. Education provided on option of consulting with Barbara Cower from [employer] or [place] with [employer] to get education for options and cost estimates. Pt verbalized understanding and agreeable.   Pt reports at [facility], he did a stand pivot transfer with RW with mod I, pt reports he wa  Pt reports he lives with uncle, who is able to provide intermittent assist, as he works and is gone 7-4. Pt reports his cousin(who lives 5 minutes away) and neighbors are available to provide physical assistance as needed.  Discussed someone bringing in pt wheelchair. Pt reports he will ask his family to bring it in.   Pt reports discomfort upon lowering  legs of bed to flat position, education provided for positioning in supine with leg flat for hip flexion contracture prevention and future prosthetic fitting. Education provided how to adjust bed oneself for supine positioning. Pt verbalized understanding and agreeable.    Pt attempting supine to sit with HOB raised and use of bed rails with CGA, however pt stopped 2/2 L LE phantom pain, reports "feels like my knee is busting open even though it isn't there." On 2nd attempt pt able to perform supine to sit with bed features and CGA and significantly increased time, verbal cues provided for technique, and breathing in through the nose and out through the mouth for pain management.  Pt reports mild dizziness with sitting EOB, vitals assessed 12476, HR 101.   Pt attempted slide board transfer with +2 A, pt unable to tolerate 2/2 slide board under L LE, attempted slide board transfer with +2 max A to R however returned to bed 1/3 of way through transfer with +2 total A 2/2 L LE pain and dressing falling off, verbal cues provided for technique with emphasis of not leaning posteriorly.   Pt performed lateral scooting to R to [facility] with CGA, verbal cues provided for technique. Pt performed sit to supine with use of bed features and min A, for trunk.   Nurse present to change dressing. Therapist notified Scott from [employer] for L LE shrinker, noted pt may need custom shrinker (if possible) 2/2 size of residual limb. Scott plans to come by this afternoon.   MD  present for daily rounds. Pt reports feeling "sick to his stomach from pain and nerves of it all." Therapist provided emesis bag throughout session as needed, however not needed.   Pt supine in bed with all needs in reach and bed alarm on.    Treatment Session 2   Pt supine in bed upon arrival. Pt agreeable to therapy. Pt reports 8/10 L LE residual limb phantom pain, nurse notified.   Pt reports scott from [employer] delivered shrinker, checked orders and  no updated order for MD. Notified MD via secure chat. Therapist plans to continue to follow up to determine if shrinker appropriate or if need to maintain ace bandage 2/2 increased size of residual limb and drainage.  Pt performed rolling to L with increased time and supervision, while therpaist donned and doffed pants to check for urinary incontinence 2/2 foul odor. No evidence of incontinence. Notified nursing of odor.   Supine to sit with bed features and CGA and increased time.   Sit to stand with RW and min A from elevated hospital bed with +2 for safety. Pt able to tolerate standing for 30 seconds each trial prior to requiring seated rest break 2/2 pain/fatigue.  Pt performed sit to supine with use of bed features with supervision and increased time.   Pt performed the following therex for B LE strengthening/ROM and contracture prevention.   1x10 R LE LAQ  1x10 B LE seated marching  1x10 supine SLR B  1x10 supine hip abduction B  1x10 glute sets holding for 5 seconds  Lying in supine for ~ 10 minutes for hip flexion contracutre prevention.    Pt ace bandage fell off during session. Nurse notified.   Pt supine in bed with bed alarm on and needs within reach at end of session.       Therapy Documentation Precautions:  Precautions Precautions: Fall Precaution Comments: wound vac L residual limb Restrictions Weight Bearing Restrictions: Yes LLE Weight Bearing: Non weight bearing  Therapy/Group: Individual Therapy  [facility] Ambrose Finland, [place], DPT  08/02/2023, 7:48 AM

## 2023-08-02 NOTE — Progress Notes (Addendum)
PROGRESS NOTE   Subjective/Complaints: Wound VAC was removed yesterday.  Patient reports that therapy has been more challenging than he expected, understands it will take time to improve his strength and function.  Patient does have some phantom pain around where his knee would have been.  ROS: Patient denies fever, rash, sore throat, blurred vision, dizziness, nausea, vomiting, diarrhea, cough, shortness of breath or chest pain,  headache, or mood change. +phantom pain  Objective:   No results found. Recent Labs    07/31/23 0315 08/01/23 0538  WBC 9.3 10.1  HGB 7.4* 7.9*  HCT 25.4* 27.7*  PLT 475* 512*   Recent Labs    07/31/23 0315 08/01/23 0538  NA 132* 131*  K 4.1 4.4  CL 99 96*  CO2 24 28  GLUCOSE 289* 201*  BUN 18 15  CREATININE 0.75 0.81  CALCIUM 8.8* 9.2    Intake/Output Summary (Last 24 hours) at 08/02/2023 0841 Last data filed at 08/02/2023 0510 Gross per 24 hour  Intake 360 ml  Output 1050 ml  Net -690 ml        Physical Exam: Vital Signs Blood pressure (!) 117/56, pulse 90, temperature 98.3 F (36.8 C), resp. rate 18, height 5\' 10"  (1.778 m), weight (!) 159 kg, SpO2 96%.   General: No acute distress, lying in bed HEENT: Head is normocephalic, atraumatic, MMM Heart: Reg rate and rhythm.  Chest: CTA bilaterally without wheezes, rales, or rhonchi; no distress Abdomen: Soft, non-tender, protuberant, bowel sounds positive Extremities: No clubbing, cyanosis, or edema. Pulses are 2+ Psych: Pt's affect is appropriate. Pt is cooperative Skin: Clean and intact without signs of breakdown Neuro:  Mental Status: He is alert and oriented to person, place, and time. Mental status is at baseline.     Comments: Decreased to light touch in RLE from knee downwards  Musculoskeletal:   Cervical back: Neck supple. No tenderness.     Comments: Ues 5/5 in biceps, triceps, WE, grip and FA B/L RLE- 5-/5 in HF, KE,  DF and PF LLE- 3/5 in HF L AKA with wound VAC in place  Skin:    General: Skin is warm and dry.     Comments: Venous stasis changes on RLE from mid calf downwards- and 1-2+ LE edema from R ankle VAC has been removed    Assessment/Plan: 1. Functional deficits which require 3+ hours per day of interdisciplinary therapy in a comprehensive inpatient rehab setting. Physiatrist is providing close team supervision and 24 hour management of active medical problems listed below. Physiatrist and rehab team continue to assess barriers to discharge/monitor patient progress toward functional and medical goals  Care Tool:  Bathing    Body parts bathed by patient: Right arm, Left arm, Chest, Abdomen, Front perineal area, Right upper leg, Face   Body parts bathed by helper: Buttocks, Right lower leg, Left upper leg Body parts n/a: Left lower leg   Bathing assist Assist Level: Moderate Assistance - Patient 50 - 74%     Upper Body Dressing/Undressing Upper body dressing   What is the patient wearing?: Pull over shirt    Upper body assist Assist Level: Set up assist    Lower  Body Dressing/Undressing Lower body dressing      What is the patient wearing?:  (Shorts)     Lower body assist Assist for lower body dressing: Maximal Assistance - Patient 25 - 49%     Toileting Toileting Toileting Activity did not occur (Clothing management and hygiene only): N/A (no void or bm)  Toileting assist Assist for toileting: Moderate Assistance - Patient 50 - 74%     Transfers Chair/bed transfer  Transfers assist  Chair/bed transfer activity did not occur: Safety/medical concerns        Locomotion Ambulation   Ambulation assist   Ambulation activity did not occur: Safety/medical concerns          Walk 10 feet activity   Assist  Walk 10 feet activity did not occur: Safety/medical concerns        Walk 50 feet activity   Assist Walk 50 feet with 2 turns activity did not  occur: Safety/medical concerns         Walk 150 feet activity   Assist Walk 150 feet activity did not occur: Safety/medical concerns         Walk 10 feet on uneven surface  activity   Assist Walk 10 feet on uneven surfaces activity did not occur: Safety/medical concerns         Wheelchair     Assist Is the patient using a wheelchair?: Yes Type of Wheelchair: Manual Wheelchair activity did not occur: Safety/medical concerns         Wheelchair 50 feet with 2 turns activity    Assist    Wheelchair 50 feet with 2 turns activity did not occur: Safety/medical concerns       Wheelchair 150 feet activity     Assist  Wheelchair 150 feet activity did not occur: Safety/medical concerns       Blood pressure (!) 117/56, pulse 90, temperature 98.3 F (36.8 C), resp. rate 18, height 5\' 10"  (1.778 m), weight (!) 159 kg, SpO2 96%.  Medical Problem List and Plan: 1. Functional deficits secondary to infected L BKA s/p AKA on 07/27/23              -patient may not shower until VAC removed             -ELOS/Goals: 10-12 days- supervision to min A at w/c level  -Continue CIR  -Team conference tomorrow 2.  Antithrombotics: -DVT/anticoagulation:  Pharmaceutical: Lovenox 150 mg BID until Coumadin/INR therapeutic             -antiplatelet therapy: n/a 3. Pain Management: chronic pain and neuropathy-  Con't tylenol prn, Oxycodone 5-15 mg q4 hours prn and gabapentin  scheduled- as well as Robaxin prn  -11/4 increase gabapentin to 600 mg 3 times daily from twice daily  11/5 may take a few days for gabapentin increase to take full effect.  Continue to monitor phantom pain 4. Mood/Behavior/Sleep/Anxiety/Depression:              -Continue sertraline 50mg  daily and Gabapentin 600mg  BID             Also team milieu to help with mood and anxiety -antipsychotic agents: n/a 5. Neuropsych/cognition: This patient is capable of making decisions on his own behalf. 6. Skin/Wound  Care: has VAC -for 7 days from 07/27/23  -11/5 wound VAC was removed yesterday 7. Fluids/Electrolytes/Nutrition/chronic hyponatremia: current Na 132- will recheck in AM             -Continue vitamins and supplements 8.  Diabetes type 2 with polyneuropathy, poorly controlled, Hgb A1C 10.2: SSI, increased semglee to 35U on d/c from acute, metformin to be resumed upon admission to CIR 1000 mg (home dose)  BID-AC  -11/5 Increase semglee to 38 u BID as CBGs have been a little elevated  CBG (last 3)  Recent Labs    08/01/23 1645 08/01/23 2055 08/02/23 0603  GLUCAP 215* 205* 205*    9. Acute on chronic anemia: Hgb 7.4 after 1U PRBCs on 11/1, transfuse if <7  -11/4 patient was noted to have some slight bleeding around his backside.  Overall hemoglobin stable at 7.9 today.  -11/5 bleeding appears to be improved, recheck CBC Thursday 10. HTN: continue metoprolol 25mg  BID and lisinopril 20mg  daily  -11/5 BP stable continue to monitor    08/02/2023    5:06 AM 08/01/2023    7:54 PM 08/01/2023    2:35 PM  Vitals with BMI  Systolic 117 121 161  Diastolic 56 65 58  Pulse 90 91 88     11. HLD: continue zetia 10mg  daily 12. CAD and mechanical aortic valve: on coumadin resumed on 07/28/23, pharmacy following, bridging with lovenox 13. GERD: continue protonix 40mg  daily 14. Morbid obesity: provide dietary education 15. Thrombocytosis: likely reactive, recheck labs in AM and monitor 16. MRSA positive nasal swab: mupirocin BID 17. L BKA stump infection- done with IV ABX since amputation removed infection. .  18. Microscopic hematuria- has nonobstructive renal stones- monitor clinically.  19. GERD-con't PPI  LOS: 2 days A FACE TO FACE EVALUATION WAS PERFORMED  Fanny Dance 08/02/2023, 8:41 AM

## 2023-08-02 NOTE — Progress Notes (Signed)
PHARMACY - ANTICOAGULATION CONSULT NOTE  Pharmacy Consult for Warfarin Indication: AVR replacement  No Known Allergies  Patient Measurements: Height: 5\' 10"  (177.8 cm) Weight: (!) 159 kg (350 lb 8.5 oz) IBW/kg (Calculated) : 73 Heparin Dosing Weight: 113.5  Vital Signs: Temp: 98.3 F (36.8 C) (11/05 0506) BP: 117/56 (11/05 0506) Pulse Rate: 90 (11/05 0506)  Labs: Recent Labs    07/31/23 0315 08/01/23 0538 08/02/23 0522  HGB 7.4* 7.9*  --   HCT 25.4* 27.7*  --   PLT 475* 512*  --   LABPROT 17.3* 16.8* 18.3*  INR 1.4* 1.3* 1.5*  CREATININE 0.75 0.81  --     Estimated Creatinine Clearance: 171.3 mL/min (by C-G formula based on SCr of 0.81 mg/dL).  Assessment: 47 yo male admitted for L BKA stump infection. Patient is on warfarin PTA for history of mechanical AVR.  PTA warfarin regimen: 5mg  MWF, 10mg  all other days.   Warfarin resumed 10/31 after BKA, held x 2 days  INR increased 1.5 today. Will give high dose again. On lovenox bridge.   Goal of Therapy:  INR: 2-3 Anti-Xa: 0.6-1 Monitor platelets by anticoagulation protocol: Yes   Plan:  Warfarin 10 mg po x 1 Cont Lovenox 150mg  SQ BID Monitor daily INR and CBC  [name], PharmD, BCIDP, AAHIVP, CPP Infectious Disease Pharmacist 08/02/2023 9:31 AM

## 2023-08-03 DIAGNOSIS — E1142 Type 2 diabetes mellitus with diabetic polyneuropathy: Secondary | ICD-10-CM | POA: Diagnosis not present

## 2023-08-03 DIAGNOSIS — M792 Neuralgia and neuritis, unspecified: Secondary | ICD-10-CM | POA: Diagnosis not present

## 2023-08-03 DIAGNOSIS — D75839 Thrombocytosis, unspecified: Secondary | ICD-10-CM

## 2023-08-03 DIAGNOSIS — S78112A Complete traumatic amputation at level between left hip and knee, initial encounter: Secondary | ICD-10-CM | POA: Diagnosis not present

## 2023-08-03 DIAGNOSIS — I10 Essential (primary) hypertension: Secondary | ICD-10-CM | POA: Diagnosis not present

## 2023-08-03 LAB — GLUCOSE, CAPILLARY
Glucose-Capillary: 178 mg/dL — ABNORMAL HIGH (ref 70–99)
Glucose-Capillary: 196 mg/dL — ABNORMAL HIGH (ref 70–99)
Glucose-Capillary: 138 mg/dL — ABNORMAL HIGH (ref 70–99)
Glucose-Capillary: 122 mg/dL — ABNORMAL HIGH (ref 70–99)

## 2023-08-03 LAB — PROTIME-INR
INR: 1.7 — ABNORMAL HIGH (ref 0.8–1.2)
Prothrombin Time: 19.8 s — ABNORMAL HIGH (ref 11.4–15.2)

## 2023-08-03 MED ORDER — WARFARIN SODIUM 2.5 MG PO TABS
10.0000 mg | ORAL_TABLET | Freq: Once | ORAL | Status: AC
  Administered 2023-08-03: 10 mg via ORAL
  Filled 2023-08-03: qty 4

## 2023-08-03 NOTE — Plan of Care (Signed)
Upgraded goals as pt progressing quicker than anticipated.  Problem: RH Balance Goal: LTG Patient will maintain dynamic standing balance (PT) Description: LTG:  Patient will maintain dynamic standing balance with assistance during mobility activities (PT) Flowsheets (Taken 08/03/2023 1604) LTG: Pt will maintain dynamic standing balance during mobility activities with:: (upgraded 2/2 pt progressing quicker than anticipated) Independent with assistive device  Note: upgraded 2/2 pt progressing quicker than anticipated    Problem: Sit to Stand Goal: LTG:  Patient will perform sit to stand with assistance level (PT) Description: LTG:  Patient will perform sit to stand with assistance level (PT) Flowsheets (Taken 08/03/2023 1604) LTG: PT will perform sit to stand in preparation for functional mobility with assistance level: (upgraded 2/2 pt progressing quicker than anticipated) Independent with assistive device Note: upgraded 2/2 pt progressing quicker than anticipated    Problem: RH Bed Mobility Goal: LTG Patient will perform bed mobility with assist (PT) Description: LTG: Patient will perform bed mobility with assistance, with/without cues (PT). Flowsheets (Taken 08/03/2023 1604) LTG: Pt will perform bed mobility with assistance level of: (upgraded 2/2 pt progressing quicker than anticipated) Independent with assistive device  Note: upgraded 2/2 pt progressing quicker than anticipated    Problem: RH Bed to Chair Transfers Goal: LTG Patient will perform bed/chair transfers w/assist (PT) Description: LTG: Patient will perform bed to chair transfers with assistance (PT). Flowsheets (Taken 08/03/2023 1604) LTG: Pt will perform Bed to Chair Transfers with assistance level: (upgraded 2/2 pt progressing quicker than anticipated) Independent with assistive device    Problem: RH Ambulation Goal: LTG Patient will ambulate in controlled environment (PT) Description: LTG: Patient will ambulate in a  controlled environment, # of feet with assistance (PT). Flowsheets (Taken 08/03/2023 1604) LTG: Pt will ambulate in controlled environ  assist needed:: Supervision/Verbal cueing

## 2023-08-03 NOTE — Progress Notes (Signed)
Patient ID: Adam Long, male   DOB: 27-Sep-1976, 47 y.o.   MRN: [id number]  Met with pt to give team conference update regarding goals of CGA with transfers with the thought of being able to upgrade due to progress. Pt is having pain issues and dizziness which is limiting him in therapies. He knows he needs to be mod/I with transfers to go home with elderly uncle. He will discuss with him building a ramp for the steps into the home. Our target discharge date is 11/20. Pt needs much encouragement in therapies and his anxiety at times does interfere with his progress. Will continue to work on discharge needs and ask neuro-psych to see again for follow up, since pt felt it helped seeing him yesterday.

## 2023-08-03 NOTE — Progress Notes (Signed)
Physical Therapy Session Note  Patient Details  Name: BOWDY BAIR MRN: [id number] Date of Birth: 02/28/1976  Today's Date: 08/03/2023 PT Individual Time: 1435-1500 PT Individual Time Calculation (min): 25 min   Short Term Goals: Week 1:  PT Short Term Goal 1 (Week 1): Pt will perform STS with mod a PT Short Term Goal 2 (Week 1): Pt will perform bed/chair transfer with mod a PT Short Term Goal 3 (Week 1): Pt will propel w/c >100 ft  Skilled Therapeutic Interventions/Progress Updates:      Direct handoff of care from PT with patient positioned in bed. Pt reporting fatigue and 7/10 phantom pain levels in his LLE. Patient politely refusing to mobilize OOB due to pain but agreeable to bed level there-ex only.   Bed level there-ex completed: -1x12 SLR on L -1x12 isometric hip ext on L -1x12 hip abd/add on L  -1x10 rolling R with bed rail *rest breaks as needed  Provided patient with ice pack for phantom limb pain. Ended session in bed with all needs met.     Therapy Documentation Precautions:  Precautions Precautions: Fall Precaution Comments: wound vac L residual limb Restrictions Weight Bearing Restrictions: Yes LLE Weight Bearing: Non weight bearing General:      Therapy/Group: Individual Therapy  Peder Allums P Melodee Lupe 08/03/2023, 2:40 PM

## 2023-08-03 NOTE — Progress Notes (Signed)
Occupational Therapy Session Note  Patient Details  Name: Adam Long MRN: [id number] Date of Birth: 14-Mar-1976  Today's Date: 08/03/2023 OT Individual Time: 4098-1191 OT Individual Time Calculation (min): 82 min    Short Term Goals: Week 1:  OT Short Term Goal 1 (Week 1): Pt will perform SB transfer with Mod A x1 to access BSC/WC. OT Short Term Goal 2 (Week 1): Pt will complete LB dressing with Mod A. OT Short Term Goal 3 (Week 1): Pt will complete 3/3 toileting activities with Mod A.  Skilled Therapeutic Interventions/Progress Updates:  Pt greeted resting in bed for skilled OT session with focus on BADL participation.   Pain: Pt reported 6/10 pain in residual limb, medicated during session. OT offering intermediate rest breaks and positioning suggestions throughout session to address pain/fatigue and maximize participation/safety in session.   Functional Transfers: Pt rolls bilaterally with supervision, but increased difficulty reaching full side-lying onto L-side. Pt experiencing increased pain during bed mobility. Stedy and +2 assistance introduced to attempt OOB activity and potential adjustment of safety plan. Pt with increased anxiety as OT educates on stedy & squat pivot transfer, unable to progress due to fear of falling and further pain at residual limb. Pt benefiting from cuing to "show me how you did it at home." Pt then performs stand-hop transfer from EOB<>WC with Min A + HDRW, +2 for safety!  Self Care Tasks: Care focused lower body as patient declined to bathe this area yesterday due to increased pain. At bed level, pt requires Max A for cleaning of posterior peri-area and R lower leg/foot due to decreased functional reach. Pt able to manage anterior pre-area. Pt with notable skin breakdown at lower stomach and groin areas, LPN made aware, powder applied for skin protection.   Pt remained resting in bed with 4Ps assessed and immediate needs met. Pt continues to be  appropriate for skilled OT intervention to promote further functional independence in ADLs/IADLs.   Therapy Documentation Precautions:  Precautions Precautions: Fall Precaution Comments: wound vac L residual limb Restrictions Weight Bearing Restrictions: Yes LLE Weight Bearing: Non weight bearing   Therapy/Group: Individual Therapy  Lou Cal, OTR/L, MSOT  08/03/2023, 5:24 AM

## 2023-08-03 NOTE — Progress Notes (Signed)
Physical Therapy Session Note  Patient Details  Name: Adam Long MRN: [id number] Date of Birth: 03-29-1976  Today's Date: 08/03/2023 PT Individual Time: 4098-1191, 4782-9562 PT Individual Time Calculation (min): 72 min, 62 min   Short Term Goals: Week 1:  PT Short Term Goal 1 (Week 1): Pt will perform STS with mod a PT Short Term Goal 2 (Week 1): Pt will perform bed/chair transfer with mod a PT Short Term Goal 3 (Week 1): Pt will propel w/c >100 ft  Skilled Therapeutic Interventions/Progress Updates:      Treatment Session 1  Pt supine in bed upon arrival. Pt agreeable to therapy. Pt reports global 7/10 L LE residual limb pain, premedicated.   Therapist discussed trailing shrinker with MD, MD provided verbal order to trial. Pt unable to tolerate 2/2 pain. Therpaist notified Scott from shrinker to request white sock to assist with keeping dressing in place.   Supine to sit with supervision and use of bed features and increased time.   Sit to stand from elevated bed and WC throughout session with RW CGA/light min A stand pivot transfer with RW x2 throughout session with +1 CGA/light min A +2 present for safety. Pt ambulated 2 feet with RW and hop to gait, and CGA prior to requiring seated rest break 2/2 nausea. Pt demos decreased L LE clearance. Pt stood with RW and CGA, while donning L LE post-op dressing retention sock.   Pt reports nausea, denies any dizziness or lightheadedness. Pt reports nauea likely due to nerves. BP assessed 103/61 HR 91, Nursing notified of nausea. Nursing present to administer anti-nausea medication. Pt some relief but still present.   Pt self propelled WC 150 feet with B UE and supervision/mod I.   Pt performed the following therex for LE/UE strengthening, increasing independence with ADLs and mobility:   2x5 wheelchair pushups  1x10 single leg glute bridge with minimal to no clearance but increased activation of glutes  Encouraged pt to sit up between  sessions. Pt requesting to return to bed until next session but agreeable to working towards goal of sitting in WC between sessions.   Pt supine in bed with all needs within reach and bed alarm on.   Treatment Session 2   Pt supine in bed upon arrival. Pt agreeable to therapy at bed level but reports inability to tolerate anything OOB this afternoon 2/2 L LE phantom pain and fatigue, premedicated.   Pt performed the following exercises with mirror therapy, verbal cues provided to focus on R LE and reflection of R LE in mirror. Pt unable to tolerate exercises on L LE 2/2 phantom pain-when attempting pt closing eyes and grimacing.   1x15 ankle pumps   1x15 quad sets  1x15 long sitting hip abduction  1x15 SLR   Discussed home set up and PLOF:    Pt reports he was walking with prosthetic up until surgery for AKA on 10/30 or working part time at [employer].  Pt reports prior to his prosthetic he self propelled WC kitchen to backdoor, and used support from [facility] and walker to descend stairs, pt performed short distance ambulation on dirt to car and did stand pivot transfer into car [employer] CR-[employer]). Pt reports he drove to follow up appointments and grocery store and called for them to bring out a wheelchair.   Discussed wheelchair accessibility in house:   Pt plans to provide picture of stairs. Pt has begun conversation with uncle about installing a ramp for 2 steps  Pt reports pt is able to propel WC through house with exception of bedroom to kitchen, however pt reports plan to widen the doorway to allow improved accessibility.   Discussed DME:    Pt initially expressed interest in power wheelchair because pt felt it would be easier to navigate throughout house 2/2 narrow doorway between kitchen and bedroom, Upon further discussion, pt no longer interested in power wheelchiar.    Pt now expressing interest in lightweight manual WC because pt wanting something easier to lift in and out of car.   Upon further discussion of need to purchase OOP, pt no longer interested as pt says he can have someone bring WC to him at follow up appointments and grocery store as he was previously doing, until he follows up to get a prosthetic.    Discussed pt diabetes and increased risk of amputation 2/2 poor sensation, circulation, healing. Education provided on importance of performing daily skin inspection on bilateral LE. Education provided on signs and symptoms of infection, signs and symptoms of pressure injury. Pt performed skin inspection of R LE with A from therapist to manage mirror as pt unable to reach forward far enough at times/lift leg appropriately 2/2 body habitus. Recommended in addition to pt performing daily skin inspection, pt also have cousin/uncle perform daily skin inspection especially between the toes. Pt verbalized understanding and agreeable. Education provided on importance of going to doctor if pt notices any signs/symptoms of pressure injury/infection.   Pt supine in bed at end of session with all needs within reach and bed alarm on.        Therapy Documentation Precautions:  Precautions Precautions: Fall Precaution Comments: wound vac L residual limb Restrictions Weight Bearing Restrictions: Yes LLE Weight Bearing: Non weight bearing  Therapy/Group: Individual Therapy  [facility] Ambrose Finland, [place], DPT  08/03/2023, 10:24 AM

## 2023-08-03 NOTE — IPOC Note (Signed)
Overall Plan of Care [facility]) Patient Details Name: Adam Long MRN: [id number] DOB: 1976/06/12  Admitting Diagnosis: Above knee amputation of left lower extremity [facility])  Hospital Problems: Principal Problem:   Above knee amputation of left lower extremity (HCC)     Functional Problem List: Nursing Bladder, Bowel, Edema, Endurance, Medication Management, Pain, Safety, Skin Integrity  PT Balance, Pain, Safety, Edema, Endurance, Sensory, Motor, Skin Integrity  OT Balance, Edema, Endurance, Pain, Perception, Safety, Skin Integrity, Sensory  SLP    TR         Basic ADL's: OT Bathing, Dressing, Toileting     Advanced  ADL's: OT Simple Meal Preparation     Transfers: PT Bed to Chair, Bed Mobility, Car  OT Toilet, Tub/Shower     Locomotion: PT Wheelchair Mobility, Ambulation     Additional Impairments: OT    SLP        TR      Anticipated Outcomes Item Anticipated Outcome  Self Feeding    Swallowing      Basic self-care  Min A  Toileting  Min A   Bathroom Transfers CGA  Bowel/Bladder  patient will be continent of bowel and bladder with min assist  Transfers  CGA with LRAD  Locomotion  min a short distance gait, mod I w/c  Communication     Cognition     Pain  pain will be less than or equal to 4/10 with min assist  Safety/Judgment  patient will be free from falls/injury and making appropriate safety decisions   Therapy Plan: PT Intensity: Minimum of 1-2 x/day ,45 to 90 minutes PT Frequency: 5 out of 7 days PT Duration Estimated Length of Stay: 14-16 days OT Intensity: Minimum of 1-2 x/day, 45 to 90 minutes OT Frequency: 5 out of 7 days OT Duration/Estimated Length of Stay: 14-16 days     Team Interventions: Nursing Interventions Bowel Management, Pain Management, Skin Care/Wound Management, Bladder Management, Disease Management/Prevention, Medication Management, Discharge Planning  PT interventions Ambulation/gait training, Discharge planning,  DME/adaptive equipment instruction, Functional mobility training, Pain management, Psychosocial support, Splinting/orthotics, Therapeutic Activities, UE/LE Strength taining/ROM, UE/LE Coordination activities, Therapeutic Exercise, Stair training, Wheelchair propulsion/positioning, Skin care/wound management, Patient/family education, Neuromuscular re-education, Disease management/prevention, Firefighter, Warden/ranger  OT Interventions Warden/ranger, Community reintegration, Discharge planning, Disease mangement/prevention, Functional electrical stimulation, Functional mobility training, Pain management, Patient/family education, DME/adaptive equipment instruction, Psychosocial support, Neuromuscular re-education, Self Care/advanced ADL retraining, Skin care/wound managment, Splinting/orthotics, Therapeutic Activities, Therapeutic Exercise, UE/LE Strength taining/ROM, UE/LE Coordination activities, Visual/perceptual remediation/compensation, Wheelchair propulsion/positioning  SLP Interventions    TR Interventions    SW/CM Interventions Discharge Planning, Psychosocial Support, Patient/Family Education   Barriers to Discharge MD  Medical stability, Wound care, Weight, and Weight bearing restrictions  Nursing Wound Care, Weight bearing restrictions, Inaccessible home environment 2-level home 2 short steps to enter. Able to live on main level with bed and bath. Uncle and cousin able to provide only intemittent assist.  PT Inaccessible home environment, Home environment access/layout, Decreased caregiver support, Wound Care, Weight, Weight bearing restrictions    OT Wound Care, Lack of/limited family support, Weight, Weight bearing restrictions    SLP      SW Decreased caregiver support, Weight, Lack of/limited family support, Community education officer for SNF coverage     Team Discharge Planning: Destination: PT-  ,OT- Home , SLP-  Projected Follow-up: PT-Home health PT, OT-   Home health OT, SLP-  Projected Equipment Needs: PT-To be determined, OT- To be determined, SLP-  Equipment  Details: PT- , OT-  Patient/family involved in discharge planning: PT- Patient,  OT-Patient, SLP-   MD ELOS: 10-12 Medical Rehab Prognosis:  Excellent Assessment: The patient has been admitted for CIR therapies with the diagnosis of infected L BKA s/p AKA . The team will be addressing functional mobility, strength, stamina, balance, safety, adaptive techniques and equipment, self-care, bowel and bladder mgt, patient and caregiver education. Goals have been set at Supervision to Min A at W/C level . Anticipated discharge destination is home.    See Team Conference Notes for weekly updates to the plan of care

## 2023-08-03 NOTE — Patient Care Conference (Signed)
Inpatient RehabilitationTeam Conference and Plan of Care Update Date: 08/03/2023   Time: 12:15 PM    Patient Name: Adam Long      Medical Record Number: [id number]  Date of Birth: 11-18-1975 Sex: Male         Room/Bed: 4W13C/4W13C-01 Payor Info: Payor: [place] MEDICAID PREPAID HEALTH PLAN / Plan: [place] MEDICAID [facility] / Product Type: *No Product type* /    Admit Date/Time:  07/31/2023  2:25 PM  Primary Diagnosis:  Above knee amputation of left lower extremity [facility])  Hospital Problems: Principal Problem:   Above knee amputation of left lower extremity [facility])    Expected Discharge Date: Expected Discharge Date: 08/17/23  Team Members Present: Physician leading conference: Dr. Fanny Dance Social Worker Present: Dossie Der, LCSW Nurse Present: Chana Bode, RN PT Present: Ambrose Finland, PT OT Present: Lou Cal, OT SLP Present: Feliberto Gottron, SLP PPS Coordinator present : Fae Pippin, SLP     Current Status/Progress Goal Weekly Team Focus  Bowel/Bladder   Pt is continent of b/b. LBM: 11/3   Remain continent of b/b.   Assist w/ toileting needs as needed.    Swallow/Nutrition/ Hydration               ADL's   Setup/Supervision for UB ADLs, heavy Mod-Max A for LB care due to body habitus, decreased functional reach, and heightened pain, all tasks at bed-level.   Min A for BADLS, CGA for functional transfers   OOB activity, pain management, and functional transfers    Mobility   rolling R and L with mod A with use of bed rails, supine to sit with CGA and SIGNIFICANT increased time, sit to supine iwth min A for trunk-pt reports lightheadedness sitting EOB however vitals stable. Pt reports feeling sick to stomach 2/2 pain and nerves. Pt reports phantom pain in L LE "knee about to burst open". Trailed slide board transfer with +2 A, unable 2/2 L LE pain/dressing coming off. Pt has significant bleeding (wound vac removed 11/4). Therapist reached out to hanger  for shrinker-trailed grey AKA shrinker, pt unable to tolerate. Pt able to tolerate L LE white sock to hold dressing in place.   mod I WC propulsion, min A standing/short distance ambulation, CGA bed to chair transfer  barriers to discharge:2  steps for home entry-discussed installing/building a ramp, decreased caregiver support; L LE pain, Pt emotional and having difficulty coping-neuropsych consult?    Communication                Safety/Cognition/ Behavioral Observations               Pain   Seen yesterday for neuro-psych   Keep pain level < 4/10 on pain scale.   Assess pain q shift & PRN.    Skin   Stage 2 wound on L buttock - foam in place. Redness to L leg.   Wound on buttock will heal in a timely manner w/o complications.  Assess skin q shift & PRN.      Discharge Planning:  Home with uncle who can be there but not physically assist. Pt will need to be mod/i wheelchair level. Barriers pain, coping and dizziness   Team Discussion: Patient post left AKA; limited by phantom pain and nausea. Wound vac occluded by blood clot and removed; drainage noted from incision. Difficulty maintaining dressing to residual limb; shrinker too tight, switched to limb sock for now.   Patient on target to meet rehab goals: yes, currently needs  set up for upper body care and completes lower body at bed level.  Needs supervision for supine- sit with extra time. Needs min assist for sit - stand and +2 assist for slide-board transfers.   *See Care Plan and progress notes for long and short-term goals.   Revisions to Treatment Plan:  Neuro psych consult  Teaching Needs: Safety, medications, secondary risk management, skin care, wound care, transfers, etc.   Current Barriers to Discharge: Decreased caregiver support, Home enviroment access/layout, and Weight bearing restrictions and wound care.  Possible Resolutions to Barriers: Family education Ramp for entry to home DME: TTB      Medical Summary Current Status: L AKA, phantom pain, Dm2, HTN, CAD with aortic valve, morbid obesity , anemia  Barriers to Discharge: Medical stability;Morbid Obesity;Cardiac Complications  Barriers to Discharge Comments: L AKA, phantom pain, Dm2, HTN, CAD with aortic valve, morbid obesity , anemia Possible Resolutions to [employer] Focus: adjust gabapentin, monitor BD, increase insulin, monitor CBC   Continued Need for Acute Rehabilitation Level of Care: The patient requires daily medical management by a physician with specialized training in physical medicine and rehabilitation for the following reasons: Direction of a multidisciplinary physical rehabilitation program to maximize functional independence : Yes Medical management of patient stability for increased activity during participation in an intensive rehabilitation regime.: Yes Analysis of laboratory values and/or radiology reports with any subsequent need for medication adjustment and/or medical intervention. : Yes   I attest that I was present, lead the team conference, and concur with the assessment and plan of the team.   Chana Bode B 08/03/2023, 1:16 PM

## 2023-08-03 NOTE — Progress Notes (Signed)
PHARMACY - ANTICOAGULATION CONSULT NOTE  Pharmacy Consult for Warfarin Indication: AVR replacement  No Known Allergies  Patient Measurements: Height: 5\' 10"  (177.8 cm) Weight: (!) 159 kg (350 lb 8.5 oz) IBW/kg (Calculated) : 73 Heparin Dosing Weight: 113.5  Vital Signs: Temp: 98.3 F (36.8 C) (11/06 0509) Temp Source: Oral (11/06 0509) BP: 105/60 (11/06 0509) Pulse Rate: 89 (11/06 0509)  Labs: Recent Labs    08/01/23 0538 08/02/23 0522 08/03/23 0530  HGB 7.9*  --   --   HCT 27.7*  --   --   PLT 512*  --   --   LABPROT 16.8* 18.3* 19.8*  INR 1.3* 1.5* 1.7*  CREATININE 0.81  --   --     Estimated Creatinine Clearance: 171.3 mL/min (by C-G formula based on SCr of 0.81 mg/dL).  Assessment: 47 yo male admitted for L BKA stump infection. Patient is on warfarin PTA for history of mechanical AVR.  PTA warfarin regimen: 5mg  MWF, 10mg  all other days.   Warfarin resumed 10/31 after BKA, held x 2 days  INR up to 1.7 today. Likely to be therapuetic in AM. On lovenox bridge.   Goal of Therapy:  INR: 2-3 Anti-Xa: 0.6-1 Monitor platelets by anticoagulation protocol: Yes   Plan:  Warfarin 10 mg po x 1 Cont Lovenox 150mg  SQ BID Monitor daily INR and CBC  [name], PharmD, BCIDP, AAHIVP, CPP Infectious Disease Pharmacist 08/03/2023 8:11 AM

## 2023-08-03 NOTE — Progress Notes (Signed)
PROGRESS NOTE   Subjective/Complaints: Patient reports continued phantom pain left lower extremity.  Overall pain is tolerable with current regimen.  Patient noted to have some anxiety during therapy sessions.  ROS: Patient denies fever, abdominal pain, shortness of breath, chest pain, new motor or sensory changes +phantom pain  Objective:   No results found. Recent Labs    08/01/23 0538  WBC 10.1  HGB 7.9*  HCT 27.7*  PLT 512*   Recent Labs    08/01/23 0538  NA 131*  K 4.4  CL 96*  CO2 28  GLUCOSE 201*  BUN 15  CREATININE 0.81  CALCIUM 9.2    Intake/Output Summary (Last 24 hours) at 08/03/2023 1413 Last data filed at 08/03/2023 1318 Gross per 24 hour  Intake 840 ml  Output 1600 ml  Net -760 ml        Physical Exam: Vital Signs Blood pressure 105/60, pulse 89, temperature 98.3 F (36.8 C), temperature source Oral, resp. rate 18, height 5\' 10"  (1.778 m), weight (!) 156.2 kg, SpO2 95%.   General: No acute distress, lying in bed HEENT: Head is normocephalic, atraumatic, MMM Heart: Reg rate and rhythm.  Chest: CTA bilaterally without wheezes, rales, or rhonchi; no distress Abdomen: Soft, non-tender, protuberant, bowel sounds positive Extremities: No clubbing, cyanosis, or edema. Pulses are 2+ Psych: Pt's affect is appropriate. Pt is cooperative Skin: Clean and intact without signs of breakdown Neuro:  Mental Status: He is alert and oriented to person, place, and time. Mental status is at baseline.     Comments: Decreased to light touch in RLE from knee downwards  Musculoskeletal:   Cervical back: Neck supple. No tenderness.     Comments: Ues 5/5 in biceps, triceps, WE, grip and FA B/L RLE- 5-/5 in HF, KE, DF and PF LLE- 3/5 in HF L AKA -incision looks okay, no signs of bleeding noted today Skin:    General: Skin is warm and dry.     Comments: Venous stasis changes on RLE from mid calf downwards- and  1-2+ LE edema from R ankle     Assessment/Plan: 1. Functional deficits which require 3+ hours per day of interdisciplinary therapy in a comprehensive inpatient rehab setting. Physiatrist is providing close team supervision and 24 hour management of active medical problems listed below. Physiatrist and rehab team continue to assess barriers to discharge/monitor patient progress toward functional and medical goals  Care Tool:  Bathing    Body parts bathed by patient: Right arm, Left arm, Chest, Abdomen, Front perineal area, Right upper leg, Face   Body parts bathed by helper: Buttocks, Right lower leg, Left upper leg Body parts n/a: Left lower leg   Bathing assist Assist Level: Moderate Assistance - Patient 50 - 74%     Upper Body Dressing/Undressing Upper body dressing   What is the patient wearing?: Pull over shirt    Upper body assist Assist Level: Set up assist    Lower Body Dressing/Undressing Lower body dressing      What is the patient wearing?:  (Shorts)     Lower body assist Assist for lower body dressing: Maximal Assistance - Patient 25 - 49%  Toileting Toileting Toileting Activity did not occur Press photographer and hygiene only): N/A (no void or bm)  Toileting assist Assist for toileting: Moderate Assistance - Patient 50 - 74%     Transfers Chair/bed transfer  Transfers assist  Chair/bed transfer activity did not occur: Safety/medical concerns        Locomotion Ambulation   Ambulation assist   Ambulation activity did not occur: Safety/medical concerns          Walk 10 feet activity   Assist  Walk 10 feet activity did not occur: Safety/medical concerns        Walk 50 feet activity   Assist Walk 50 feet with 2 turns activity did not occur: Safety/medical concerns         Walk 150 feet activity   Assist Walk 150 feet activity did not occur: Safety/medical concerns         Walk 10 feet on uneven surface   activity   Assist Walk 10 feet on uneven surfaces activity did not occur: Safety/medical concerns         Wheelchair     Assist Is the patient using a wheelchair?: Yes Type of Wheelchair: Manual Wheelchair activity did not occur: Safety/medical concerns         Wheelchair 50 feet with 2 turns activity    Assist    Wheelchair 50 feet with 2 turns activity did not occur: Safety/medical concerns       Wheelchair 150 feet activity     Assist  Wheelchair 150 feet activity did not occur: Safety/medical concerns       Blood pressure 105/60, pulse 89, temperature 98.3 F (36.8 C), temperature source Oral, resp. rate 18, height 5\' 10"  (1.778 m), weight (!) 156.2 kg, SpO2 95%.  Medical Problem List and Plan: 1. 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 Formulized individual treatment plan based on medical history, underlying problem and comorbidities.   -Patient did not tolerate shrinker, was able to tolerate large sock, will work towards shrinker  2.  Antithrombotics: -DVT/anticoagulation:  Pharmaceutical: Lovenox 150 mg BID until Coumadin/INR therapeutic             -antiplatelet therapy: n/a 3. Pain Management: chronic pain and neuropathy-  Con't tylenol prn, Oxycodone 5-15 mg q4 hours prn and gabapentin  scheduled- as well as Robaxin prn  -11/4 increase gabapentin to 600 mg 3 times daily from twice daily  11/5 may take a few days for gabapentin increase to take full effect.  Continue to monitor phantom pain  11/6 consider increase gabapentin to 800 mg 3 times daily if phantom pain does not improve  4. Mood/Behavior/Sleep/Anxiety/Depression:               -Continue sertraline 50mg  daily and Gabapentin 600mg  BID             Also team milieu to help with mood and anxiety -antipsychotic agents: n/a 5. Neuropsych/cognition: This patient is capable of making decisions on his own behalf. 6. Skin/Wound Care: has VAC -for 7 days from 07/27/23  -11/5 wound VAC was removed yesterday  7. Fluids/Electrolytes/Nutrition/chronic hyponatremia: current Na 132- will recheck in AM             -Continue vitamins and supplements 8. Diabetes type 2 with polyneuropathy, poorly controlled, Hgb A1C 10.2: SSI, increased semglee to 35U on d/c from acute, metformin to be resumed upon admission to CIR 1000 mg (home dose)  BID-AC  -11/5 Increase semglee to 38 u BID as CBGs have been a little elevated  -11/6 monitor response to medication change today, CBGs a little better today CBG (last 3)  Recent Labs    08/02/23 2100 08/03/23 0622 08/03/23 1156  GLUCAP 184* 178* 196*    9. Acute on chronic anemia: Hgb 7.4 after 1U PRBCs on 11/1, transfuse if <7  -11/4 patient was noted to have some slight bleeding around his backside.  Overall hemoglobin stable at 7.9 today.  -11/5 bleeding appears to be improved, recheck CBC Thursday  Recheck CBC tomorrow 10. HTN: continue metoprolol 25mg  BID and lisinopril 20mg  daily  -11/6 BP controlled continue current regimen    08/03/2023    9:00 AM 08/03/2023    5:09 AM 08/02/2023    7:51 PM  Vitals with BMI  Weight 344 lbs 6 oz    BMI 49.41    Systolic  105 100  Diastolic  60 66  Pulse  89 97     11. HLD: continue zetia 10mg  daily 12. CAD and mechanical aortic valve: on coumadin resumed on 07/28/23, pharmacy following, bridging with lovenox 13. GERD: continue protonix 40mg  daily 14. Morbid obesity: provide dietary education 15. Thrombocytosis: likely reactive, recheck tomorrow 16. MRSA positive nasal swab: mupirocin BID 17. L BKA stump infection- done with IV ABX since amputation removed infection. .  18. Microscopic  hematuria- has nonobstructive renal stones- monitor clinically.  19. GERD-con't PPI  LOS: 3 days A FACE TO FACE EVALUATION WAS PERFORMED  Fanny Dance 08/03/2023, 2:13 PM

## 2023-08-04 DIAGNOSIS — I10 Essential (primary) hypertension: Secondary | ICD-10-CM | POA: Diagnosis not present

## 2023-08-04 DIAGNOSIS — E1142 Type 2 diabetes mellitus with diabetic polyneuropathy: Secondary | ICD-10-CM | POA: Diagnosis not present

## 2023-08-04 DIAGNOSIS — S78112A Complete traumatic amputation at level between left hip and knee, initial encounter: Secondary | ICD-10-CM | POA: Diagnosis not present

## 2023-08-04 DIAGNOSIS — M792 Neuralgia and neuritis, unspecified: Secondary | ICD-10-CM | POA: Diagnosis not present

## 2023-08-04 LAB — CBC
HCT: 22.8 % — ABNORMAL LOW (ref 39.0–52.0)
Hemoglobin: 6.6 g/dL — CL (ref 13.0–17.0)
MCH: 20.8 pg — ABNORMAL LOW (ref 26.0–34.0)
MCHC: 28.9 g/dL — ABNORMAL LOW (ref 30.0–36.0)
MCV: 71.9 fL — ABNORMAL LOW (ref 80.0–100.0)
Platelets: 392 10*3/uL (ref 150–400)
RBC: 3.17 MIL/uL — ABNORMAL LOW (ref 4.22–5.81)
RDW: 17.2 % — ABNORMAL HIGH (ref 11.5–15.5)
WBC: 10 10*3/uL (ref 4.0–10.5)
nRBC: 0.2 % (ref 0.0–0.2)

## 2023-08-04 LAB — BASIC METABOLIC PANEL
Anion gap: 8 (ref 5–15)
BUN: 22 mg/dL — ABNORMAL HIGH (ref 6–20)
CO2: 27 mmol/L (ref 22–32)
Calcium: 8.6 mg/dL — ABNORMAL LOW (ref 8.9–10.3)
Chloride: 95 mmol/L — ABNORMAL LOW (ref 98–111)
Creatinine, Ser: 0.96 mg/dL (ref 0.61–1.24)
GFR, Estimated: >60 mL/min (ref >60)
Glucose, Bld: 141 mg/dL — ABNORMAL HIGH (ref 70–99)
Potassium: 4.1 mmol/L (ref 3.5–5.1)
Sodium: 130 mmol/L — ABNORMAL LOW (ref 135–145)

## 2023-08-04 LAB — GLUCOSE, CAPILLARY
Glucose-Capillary: 149 mg/dL — ABNORMAL HIGH (ref 70–99)
Glucose-Capillary: 187 mg/dL — ABNORMAL HIGH (ref 70–99)
Glucose-Capillary: 167 mg/dL — ABNORMAL HIGH (ref 70–99)
Glucose-Capillary: 149 mg/dL — ABNORMAL HIGH (ref 70–99)

## 2023-08-04 LAB — PROTIME-INR
INR: 1.8 — ABNORMAL HIGH (ref 0.8–1.2)
Prothrombin Time: 20.9 s — ABNORMAL HIGH (ref 11.4–15.2)

## 2023-08-04 LAB — PREPARE RBC (CROSSMATCH)

## 2023-08-04 MED ORDER — LISINOPRIL 10 MG PO TABS
10.0000 mg | ORAL_TABLET | Freq: Every day | ORAL | Status: DC
  Administered 2023-08-05: 10 mg via ORAL
  Filled 2023-08-04: qty 1

## 2023-08-04 MED ORDER — WARFARIN SODIUM 2.5 MG PO TABS
12.5000 mg | ORAL_TABLET | Freq: Once | ORAL | Status: AC
  Administered 2023-08-04: 12.5 mg via ORAL
  Filled 2023-08-04: qty 5

## 2023-08-04 MED ORDER — GABAPENTIN 400 MG PO CAPS
800.0000 mg | ORAL_CAPSULE | Freq: Three times a day (TID) | ORAL | Status: DC
  Administered 2023-08-04 – 2023-08-17 (×39): 800 mg via ORAL
  Filled 2023-08-04 (×39): qty 2

## 2023-08-04 MED ORDER — SODIUM CHLORIDE 0.9% IV SOLUTION: Freq: Once | INTRAVENOUS | Status: AC

## 2023-08-04 NOTE — Progress Notes (Signed)
Occupational Therapy Session Note  Patient Details  Name: Adam Long MRN: [id number] Date of Birth: 09-Apr-1976  Today's Date: 08/04/2023 OT Individual Time: 1100-1158 OT Individual Time Calculation (min): 58 min    Short Term Goals: Week 1:  OT Short Term Goal 1 (Week 1): Pt will perform SB transfer with Mod A x1 to access BSC/WC. OT Short Term Goal 2 (Week 1): Pt will complete LB dressing with Mod A. OT Short Term Goal 3 (Week 1): Pt will complete 3/3 toileting activities with Mod A.  Skilled Therapeutic Interventions/Progress Updates:   Pt seen for skilled OT session. Pt bed level reporting need for blood transfusion due to low HGB. Pt deferring shower this am but agreeable for tomorrow and other therex and activity in room.  Initiated visit with education for watching mirror therapy education video on YouTube for phantom pain relief. Pt aware of technique, trialed yesterday and open to education and 2nd mirror therapy interval with + relief to 6/10 L residual limb with HOB elevated. Pt then trained in several therex for UE including red tband for triceps 20 reps x 3 sets with rest breaks for B. 2 lb cane ex with weighted bar scap, sh, elbow 10 reps x 3 sets of each. Rest between sets. Left pt bed level with all needs, nurse call button and bed exit engaged.   Pain: 7/10 L LE with repositioning and ice with relief to 6/10 L LE   Therapy Documentation Precautions:  Precautions Precautions: Fall Precaution Comments: wound vac L residual limb Restrictions Weight Bearing Restrictions: Yes LLE Weight Bearing: Non weight bearing  Therapy/Group: Individual Therapy  Vicenta Dunning 08/04/2023, 7:55 AM

## 2023-08-04 NOTE — Progress Notes (Signed)
Physical Therapy Session Note  Patient Details  Name: Adam Long MRN: [id number] Date of Birth: [birth year]  Today's Date: 08/04/2023 PT Individual Time: 1015-1100 PT Individual Time Calculation (min): 45 min   Short Term Goals: Week 1:  PT Short Term Goal 1 (Week 1): Pt will perform STS with mod a PT Short Term Goal 2 (Week 1): Pt will perform bed/chair transfer with mod a PT Short Term Goal 3 (Week 1): Pt will propel w/c >100 ft  Skilled Therapeutic Interventions/Progress Updates:    pt received in bed and agreeable to therapy with encouragement. Pt reports high levels of phantom and incision pain, as well as feeling unwell d/t low hemoglobin. Pt agreeable to sit EOB and transitions with CGA. Session focused on sitting upright for upright tolerance and core strength. LE exercises included LAQ and seated calf raises. Discussion focused pain neuroscience education, including gait control theory, mental health maintenance, bucket theory, etc. Also provided psychosocial support. Pt reports feelings of depression and unhealthy coping mechanisms, pt agreeable to peer support and reports he will seek familial/friend support while hospitalized. Pt provided ice at end of session after return to bed, was left with all needs in reach and alarm active.   Therapy Documentation Precautions:  Precautions Precautions: Fall Precaution Comments: wound vac L residual limb Restrictions Weight Bearing Restrictions: No LLE Weight Bearing: Non weight bearing General: PT Amount of Missed Time (min): 30 Minutes PT Missed Treatment Reason: Bowel/bladder accident     Therapy/Group: Individual Therapy  Juluis Rainier 08/04/2023, 4:10 PM

## 2023-08-04 NOTE — Progress Notes (Addendum)
PHARMACY - ANTICOAGULATION CONSULT NOTE  Pharmacy Consult for Warfarin Indication: AVR replacement  No Known Allergies  Patient Measurements: Height: 5\' 10"  (177.8 cm) Weight: (!) 156.2 kg (344 lb 5.7 oz) IBW/kg (Calculated) : 73 Heparin Dosing Weight: 113.5  Vital Signs: Temp: 98.6 F (37 C) (11/07 0458) BP: 102/63 (11/07 0458) Pulse Rate: 85 (11/07 0458)  Labs: Recent Labs    08/02/23 0522 08/03/23 0530 08/04/23 0547  HGB  --   --  6.6*  HCT  --   --  22.8*  PLT  --   --  392  LABPROT 18.3* 19.8* 20.9*  INR 1.5* 1.7* 1.8*  CREATININE  --   --  0.96    Estimated Creatinine Clearance: 143 mL/min (by C-G formula based on SCr of 0.96 mg/dL).  Assessment: 47 yo male admitted for L BKA stump infection. Patient is on warfarin PTA for history of mechanical AVR.  PTA warfarin regimen: 5mg  MWF, 10mg  all other days.   Warfarin resumed 10/31 after BKA, held x 2 days  INR up to 1.8. On lovenox bridge. Hgb down to 6.6 this AM.   Addendum Due to the drop in hgb, we will stop lovenox bridging since his INR is likely to be therapeutic in Am after discussing with Dr Natale Lay.  Goal of Therapy:  INR: 2-3 Anti-Xa: 0.6-1 Monitor platelets by anticoagulation protocol: Yes   Plan:  Warfarin 12.5 mg po x 1 Stop lovenox Monitor daily INR and CBC  [name], PharmD, BCIDP, AAHIVP, CPP Infectious Disease Pharmacist 08/04/2023 7:54 AM

## 2023-08-04 NOTE — Progress Notes (Signed)
Patient has had 3 stools today-- type 3--type 6  X 2 after no BM for almost a week. No odor and mushy. Likely evacuating bowel--no laxatives given. Will monitor.

## 2023-08-04 NOTE — Progress Notes (Addendum)
Physical Therapy Session Note  Patient Details  Name: Adam Long MRN: [id number] Date of Birth: October 17, 1975  Today's Date: 08/04/2023 PT Individual Time: 0800-0911, 1300-1330 PT Individual Time Calculation (min): 71 min, 30 min  PT Missed Time (30 min) Bowel Accident  Short Term Goals: Week 1:  PT Short Term Goal 1 (Week 1): Pt will perform STS with mod a PT Short Term Goal 2 (Week 1): Pt will perform bed/chair transfer with mod a PT Short Term Goal 3 (Week 1): Pt will propel w/c >100 ft  Skilled Therapeutic Interventions/Progress Updates:      Treatment Session 1  Pt supine in bed upon arrival. Pt agreeable to therpay. Pt reports 7/10 L residual limb pain, requesting pain medicine, notified nursing. Therapist provided rest breaks and repositioning and followed up regarding desensitization techniques and mirror therapy.   Pt performed stand pivot transfer, hospital bed to [facility] with RW and CGA, verbal cues provided safety with RW.   Pt self propelled WC from room to main gym with 1 rest break 2/2 fatigue, verbal cues provided for technique with navigating turns.   Discussed trialing squat pivot transfer as antiicpating pt to mobilize primarily in [facility], and difficult to propel WC while holding RW. Pt verbalized understanding and agreeable. Pt performed demonstration and education on technique for squat pivot transfer.   Pt performed squat pivot transfer WC to mat table, mat table to [facility] with +2 A for safety to stabilize WC, pt requires min A to R for buttock clearance. Pt demos fear of falling to L, and despite cues and A, pt demos lateral scoot transfer 2/2 little to no buttock clearance. Verbal and tactile cues provided for technique with emphasis on head hip ratio.    Therpaist provided demonstration of scooting to L along edge of mat table with emphasis on increased buttock clearance. Pt attempted wit min A with therapsit facilitating head hip ratio, however pt unable to tolerate 2/2 L  LE pain.   Pt performed the following exercise for B LE/UE/core strengthening, increasing activity tolerance and ROM.    Pt performed 1x10 Standing heel raises with RW and CGAwith heavy UE support on RW, verbal cues provided for technique, R LE positioning and R UE positioning. Verbal cues provided for breathing in through nose and out through mouth versus holding breath.   1x10 R LE LAQ  1x10 R LE seated marching  2x5 russian twists with 6# medicine ball   1x10 seated shoulder press with 6# medicine ball   1x10 seated horizontal chest press with 6# medicine ball   Pt requesting to return to bed. Sit to supine with use of bed rails and increased time, pt scooted up in bed with use of R LE and B UE with rails, with supervision, verbal cues provided for technique.   Pt supine in bed with bed alarm on and needs within reach with nurse in room to administer medications.    Treatment Session 2   Pt supine on bed pan upon arrival. Pt agreeable to therapy. Pt reports L LE phantom pain, therapist donned ice to L LE per pt request.    Pt continent of bowel. Pt performed rolling to L with use of bed rail and mod A, verbal cues provided for technique. pt performed pericare with total A. Ptdonned shorts with use of briding and rolling technique with supervision, verbal cues provided for technique.   Pt performed session in bed today 2/2 low hemoglobin and and waiting blood transfusion.  Pt utilized time to build rapport and discus pt coping strategies. Pt reports being depressed and not handling the amputation very well, pt reports procrastinating going to the doctor when he noticed the wound because he knew he would likely have to have an additional revision. Education and handout provided for Amputee support group offered once a month, education provided for in person option and virtual option. Pt expressed interest.   Therapist removed white sock on L LE residual limb as there is drainage on L LE.  Notified nursing of need for dressing change.   Discussion interrupted 2/2 pt reports abdominal pain and need to have BM. Pt performed rolling to L with mod A for placement of bed pan. Pt incontinent of liquid stool. Pt missed 30 minutes 2/2 bowel accident. Tech in room at end of session.    Notified PA (Pam) and nurse of liquid diarrhea.      Therapy Documentation Precautions:  Precautions Precautions: Fall Precaution Comments: wound vac L residual limb Restrictions Weight Bearing Restrictions: Yes LLE Weight Bearing: Non weight bearing  Therapy/Group: Individual Therapy  [facility] Ambrose Finland, [place], DPT  08/04/2023, 7:35 AM

## 2023-08-04 NOTE — Plan of Care (Signed)
  Problem: Consults Goal: RH GENERAL PATIENT EDUCATION Description: See Patient Education module for education specifics. Outcome: Progressing Goal: Diabetes Guidelines if Diabetic/Glucose > 140 Description: If diabetic or lab glucose is > 140 mg/dl - Initiate Diabetes/Hyperglycemia Guidelines & Document Interventions  Outcome: Progressing   Problem: RH BOWEL ELIMINATION Goal: RH STG MANAGE BOWEL WITH ASSISTANCE Description: STG Manage Bowel with mod I Assistance. Outcome: Progressing   Problem: RH SKIN INTEGRITY Goal: RH STG ABLE TO PERFORM INCISION/WOUND CARE W/ASSISTANCE Description: STG Able To Perform Incision/Wound Care With mod I Assistance. Outcome: Progressing   Problem: RH PAIN MANAGEMENT Goal: RH STG PAIN MANAGED AT OR BELOW PT'S PAIN GOAL Description: Pain level below 5/10 Outcome: Progressing   Problem: RH KNOWLEDGE DEFICIT GENERAL Goal: RH STG INCREASE KNOWLEDGE OF SELF CARE AFTER HOSPITALIZATION Description: Pt will be able to demonstrate understanding of medication management, dietary and lifestyle modifications to prevent secondary complications with mod I assist using booklet and handouts provided. Outcome: Progressing   Problem: Consults Goal: RH LIMB LOSS PATIENT EDUCATION Description: Description: See Patient Education module for eduction specifics. Outcome: Progressing   Problem: RH KNOWLEDGE DEFICIT LIMB LOSS Goal: RH STG INCREASE KNOWLEDGE OF SELF CARE AFTER LIMB LOSS Description: Pt will be able to demonstrate understanding of medication management, dietary and lifestyle modifications to prevent secondary complications with mod I assist using booklet and handouts provided. Outcome: Progressing

## 2023-08-04 NOTE — Progress Notes (Signed)
PROGRESS NOTE   Subjective/Complaints: Hgb noted to be decreased today at 6.6.  Patient reports increased fatigue and poor energy.  Denies any recent bleeding in the last day or two.  Patient reports continued issues with phantom pain.  ROS: Patient denies fever, abdominal pain, shortness of breath, chest pain, new motor or sensory changes +phantom pain-continued  Objective:   No results found. Recent Labs    08/04/23 0547  WBC 10.0  HGB 6.6*  HCT 22.8*  PLT 392   Recent Labs    08/04/23 0547  NA 130*  K 4.1  CL 95*  CO2 27  GLUCOSE 141*  BUN 22*  CREATININE 0.96  CALCIUM 8.6*    Intake/Output Summary (Last 24 hours) at 08/04/2023 1306 Last data filed at 08/04/2023 1200 Gross per 24 hour  Intake 696 ml  Output 2600 ml  Net -1904 ml        Physical Exam: Vital Signs Blood pressure 102/63, pulse 85, temperature 98.6 F (37 C), resp. rate 16, height 5\' 10"  (1.778 m), weight (!) 156.2 kg, SpO2 97%.   General: No acute distress, lying in bed HEENT: Head is normocephalic, atraumatic, MMM Heart: Reg rate and rhythm.  Chest: CTA bilaterally without wheezes, rales, or rhonchi; no distress Abdomen: Soft, non-tender, protuberant, bowel sounds positive Extremities: No clubbing, cyanosis, or edema. Pulses are 2+ Psych: Pt's affect is a little flat, cooperative Skin: Clean and intact without signs of breakdown Neuro:  Mental Status: He is alert and oriented to person, place, and time. Mental status is at baseline.     Comments: Decreased to light touch in RLE from knee downwards  Musculoskeletal:   Cervical back: Neck supple. No tenderness.     Comments: Ues 5/5 in biceps, triceps, WE, grip and FA B/L RLE- 5-/5 in HF, KE, DF and PF LLE- 3/5 in HF L AKA -with dry dressing sock in place Skin:    General: Skin is warm and dry.     Comments: Venous stasis changes on RLE from mid calf downwards- and 1-2+ LE edema  from R ankle     Assessment/Plan: 1. Functional deficits which require 3+ hours per day of interdisciplinary therapy in a comprehensive inpatient rehab setting. Physiatrist is providing close team supervision and 24 hour management of active medical problems listed below. Physiatrist and rehab team continue to assess barriers to discharge/monitor patient progress toward functional and medical goals  Care Tool:  Bathing    Body parts bathed by patient: Right arm, Left arm, Chest, Abdomen, Front perineal area, Right upper leg, Face   Body parts bathed by helper: Buttocks, Right lower leg, Left upper leg Body parts n/a: Left lower leg   Bathing assist Assist Level: Moderate Assistance - Patient 50 - 74%     Upper Body Dressing/Undressing Upper body dressing   What is the patient wearing?: Pull over shirt    Upper body assist Assist Level: Set up assist    Lower Body Dressing/Undressing Lower body dressing      What is the patient wearing?:  (Shorts)     Lower body assist Assist for lower body dressing: Maximal Assistance - Patient 25 - 49%  Toileting Toileting Toileting Activity did not occur Press photographer and hygiene only): N/A (no void or bm)  Toileting assist Assist for toileting: Moderate Assistance - Patient 50 - 74%     Transfers Chair/bed transfer  Transfers assist  Chair/bed transfer activity did not occur: Safety/medical concerns        Locomotion Ambulation   Ambulation assist   Ambulation activity did not occur: Safety/medical concerns          Walk 10 feet activity   Assist  Walk 10 feet activity did not occur: Safety/medical concerns        Walk 50 feet activity   Assist Walk 50 feet with 2 turns activity did not occur: Safety/medical concerns         Walk 150 feet activity   Assist Walk 150 feet activity did not occur: Safety/medical concerns         Walk 10 feet on uneven surface  activity   Assist  Walk 10 feet on uneven surfaces activity did not occur: Safety/medical concerns         Wheelchair     Assist Is the patient using a wheelchair?: Yes Type of Wheelchair: Manual Wheelchair activity did not occur: Safety/medical concerns         Wheelchair 50 feet with 2 turns activity    Assist    Wheelchair 50 feet with 2 turns activity did not occur: Safety/medical concerns       Wheelchair 150 feet activity     Assist  Wheelchair 150 feet activity did not occur: Safety/medical concerns       Blood pressure 102/63, pulse 85, temperature 98.6 F (37 C), resp. rate 16, height 5\' 10"  (1.778 m), weight (!) 156.2 kg, SpO2 97%.  Medical Problem List and Plan: 1. Functional deficits secondary to infected L BKA s/p AKA on 07/27/23              -patient may not shower until VAC removed             -ELOS/Goals: 10-12 days- supervision to min A at w/c level  -Continue CIR  -Expected discharge 11/20  -Patient did not tolerate shrinker, was able to tolerate large sock, will work towards shrinker  2.  Antithrombotics: -DVT/anticoagulation:  Pharmaceutical: Lovenox 150 mg BID until Coumadin/INR therapeutic             -antiplatelet therapy: n/a 3. Pain Management: chronic pain and neuropathy-  Con't tylenol prn, Oxycodone 5-15 mg q4 hours prn and gabapentin  scheduled- as well as Robaxin prn  -11/4 increase gabapentin to 600 mg 3 times daily from twice daily  11/8 increase gabapentin to 800 mg 3 times daily for phantom pain , mirror therapy  4. Mood/Behavior/Sleep/Anxiety/Depression:              -Continue sertraline 50mg  daily and Gabapentin 600mg  BID             Also team milieu to help with mood and anxiety -antipsychotic agents: n/a 5. Neuropsych/cognition: This patient is capable of making decisions on his own behalf. 6. Skin/Wound Care: has VAC -for 7 days from 07/27/23  -11/5 wound VAC was removed yesterday 7. Fluids/Electrolytes/Nutrition/chronic  hyponatremia: current Na 132- will recheck in AM             -Continue vitamins and supplements 8. Diabetes type 2 with polyneuropathy, poorly controlled, Hgb A1C 10.2: SSI, increased semglee to 35U on d/c from acute, metformin to be resumed upon admission to CIR  1000 mg (home dose)  BID-AC  -11/5 Increase semglee to 38 u BID as CBGs have been a little elevated  -11/6 monitor response to medication change today, CBGs a little better today  -11/7 CBGs fairly controlled, continue current regimen CBG (last 3)  Recent Labs    08/03/23 2101 08/04/23 0606 08/04/23 1140  GLUCAP 122* 149* 187*    9. Acute on chronic anemia: Hgb 7.4 after 1U PRBCs on 11/1, transfuse if <7  -11/4 patient was noted to have some slight bleeding around his backside.  Overall hemoglobin stable at 7.9 today.  -11/5 bleeding appears to be improved, recheck CBC Thursday  -11/7 Transfuse 1 U PRBC due to anemia with hemoglobin 6.6.  Discussed with pharmacy, pharmacy will stop Lovenox bridging since INR likely to be therapeutic in the morning. 10. HTN: continue metoprolol 25mg  BID and lisinopril 20mg  daily  -11/7 BP a little soft at times, decrease lisinopril to 10 mg daily    08/04/2023    4:58 AM 08/03/2023    9:10 PM 08/03/2023    7:41 PM  Vitals with BMI  Systolic 102 106 94  Diastolic 63 45 59  Pulse 85 90 93     11. HLD: continue zetia 10mg  daily 12. CAD and mechanical aortic valve: on coumadin resumed on 07/28/23, pharmacy following, bridging with lovenox 13. GERD: continue protonix 40mg  daily 14. Morbid obesity: provide dietary education 15. Thrombocytosis: likely reactive, recheck tomorrow 16. MRSA positive nasal swab: mupirocin BID 17. L BKA stump infection- done with IV ABX since amputation removed infection. .  18. Microscopic hematuria- has nonobstructive renal stones- monitor clinically.  19. GERD-con't PPI  LOS: 4 days A FACE TO FACE EVALUATION WAS PERFORMED  Fanny Dance 08/04/2023, 1:06  PM

## 2023-08-04 NOTE — Progress Notes (Signed)
Patient ID: Adam Long, male   DOB: 13-Nov-1975, 47 y.o.   MRN: [id number]  Spoke with pt about having a peer support person call  him and discuss issues and their own path. He was on board with this and have gotten the consent form signed and placed in soft chart.

## 2023-08-05 DIAGNOSIS — M792 Neuralgia and neuritis, unspecified: Secondary | ICD-10-CM | POA: Diagnosis not present

## 2023-08-05 DIAGNOSIS — S78112A Complete traumatic amputation at level between left hip and knee, initial encounter: Secondary | ICD-10-CM | POA: Diagnosis not present

## 2023-08-05 DIAGNOSIS — E1142 Type 2 diabetes mellitus with diabetic polyneuropathy: Secondary | ICD-10-CM | POA: Diagnosis not present

## 2023-08-05 DIAGNOSIS — I10 Essential (primary) hypertension: Secondary | ICD-10-CM | POA: Diagnosis not present

## 2023-08-05 DIAGNOSIS — K5900 Constipation, unspecified: Secondary | ICD-10-CM

## 2023-08-05 LAB — CBC
HCT: 24.1 % — ABNORMAL LOW (ref 39.0–52.0)
Hemoglobin: 7 g/dL — ABNORMAL LOW (ref 13.0–17.0)
MCH: 21.2 pg — ABNORMAL LOW (ref 26.0–34.0)
MCHC: 29 g/dL — ABNORMAL LOW (ref 30.0–36.0)
MCV: 73 fL — ABNORMAL LOW (ref 80.0–100.0)
Platelets: 371 10*3/uL (ref 150–400)
RBC: 3.3 MIL/uL — ABNORMAL LOW (ref 4.22–5.81)
RDW: 17.3 % — ABNORMAL HIGH (ref 11.5–15.5)
WBC: 8.2 10*3/uL (ref 4.0–10.5)
nRBC: 0 % (ref 0.0–0.2)

## 2023-08-05 LAB — PROTIME-INR
INR: 1.8 — ABNORMAL HIGH (ref 0.8–1.2)
Prothrombin Time: 21.5 s — ABNORMAL HIGH (ref 11.4–15.2)

## 2023-08-05 LAB — HEMOGLOBIN AND HEMATOCRIT, BLOOD
HCT: 23.1 % — ABNORMAL LOW (ref 39.0–52.0)
Hemoglobin: 7.1 g/dL — ABNORMAL LOW (ref 13.0–17.0)

## 2023-08-05 LAB — TYPE AND SCREEN
ABO/RH(D): A POS
Antibody Screen: NEGATIVE
Unit division: 0

## 2023-08-05 LAB — GLUCOSE, CAPILLARY
Glucose-Capillary: 180 mg/dL — ABNORMAL HIGH (ref 70–99)
Glucose-Capillary: 194 mg/dL — ABNORMAL HIGH (ref 70–99)
Glucose-Capillary: 155 mg/dL — ABNORMAL HIGH (ref 70–99)
Glucose-Capillary: 160 mg/dL — ABNORMAL HIGH (ref 70–99)

## 2023-08-05 LAB — BPAM RBC
Blood Product Expiration Date: 202411262359
ISSUE DATE / TIME: 202411071626
Unit Type and Rh: 6200

## 2023-08-05 MED ORDER — POLYETHYLENE GLYCOL 3350 17 G PO PACK
17.0000 g | PACK | Freq: Every day | ORAL | Status: DC
  Administered 2023-08-05 – 2023-08-08 (×2): 17 g via ORAL
  Filled 2023-08-05 (×5): qty 1

## 2023-08-05 MED ORDER — SORBITOL 70 % SOLN: 30.0000 mL | Freq: Every day | Status: DC | PRN

## 2023-08-05 MED ORDER — WARFARIN SODIUM 2.5 MG PO TABS
10.0000 mg | ORAL_TABLET | Freq: Once | ORAL | Status: AC
  Administered 2023-08-05: 10 mg via ORAL
  Filled 2023-08-05: qty 4

## 2023-08-05 NOTE — Progress Notes (Signed)
PHARMACY - ANTICOAGULATION CONSULT NOTE  Pharmacy Consult for Warfarin Indication: AVR replacement  No Known Allergies  Patient Measurements: Height: 5\' 10"  (177.8 cm) Weight: (!) 156.2 kg (344 lb 5.7 oz) IBW/kg (Calculated) : 73 Heparin Dosing Weight: 113.5  Vital Signs: Temp: 98.4 F (36.9 C) (11/08 0521) BP: 99/63 (11/08 0521) Pulse Rate: 85 (11/08 0521)  Labs: Recent Labs    08/03/23 0530 08/04/23 0547 08/05/23 0457  HGB  --  6.6* 7.0*  HCT  --  22.8* 24.1*  PLT  --  392 371  LABPROT 19.8* 20.9* 21.5*  INR 1.7* 1.8* 1.8*  CREATININE  --  0.96  --     Estimated Creatinine Clearance: 143 mL/min (by C-G formula based on SCr of 0.96 mg/dL).  Assessment: 47 yo male admitted for L BKA stump infection. Patient is on warfarin PTA for history of mechanical AVR.  PTA warfarin regimen: 5mg  MWF, 10mg  all other days.   Warfarin resumed 10/31 after BKA, held x 2 days  INR remained 1.8 overnight. Lovenox stopped yesterday for drop in Hgb. Hgb stable overnight with no reported s/sx of bleeding.   PTA warfarin regimen: warfarin 5 mg MWF and warfarin10 mg all other days  Goal of Therapy:  INR: 2-3 Anti-Xa: 0.6-1 Monitor platelets by anticoagulation protocol: Yes   Plan:  Warfarin 10 mg po x 1 Monitor daily INR and CBC  Ruben Im, PharmD Clinical Pharmacist 08/05/2023 9:16 AM Please check AMION for all [facility] numbers

## 2023-08-05 NOTE — Progress Notes (Signed)
Physical Therapy Session Note  Patient Details  Name: Adam Long MRN: [id number] Date of Birth: Feb 09, 1976  Today's Date: 08/05/2023 PT Individual Time: 1000-1056 PT Individual Time Calculation (min): 56 min   Short Term Goals: Week 1:  PT Short Term Goal 1 (Week 1): Pt will perform STS with mod a PT Short Term Goal 2 (Week 1): Pt will perform bed/chair transfer with mod a PT Short Term Goal 3 (Week 1): Pt will propel w/c >100 ft  Skilled Therapeutic Interventions/Progress Updates:   Received pt sitting in WC, pt agreeable to PT treatment, and reported pain 6/10 in L residual limb (premedicated). Session with emphasis on functional mobility/transfers, generalized strengthening and endurance, and dynamic standing balance/coordination. Donned R shoe with max A and performed WC mobility 43ft using BUE and supervision to dayroom - emphasis on UE strength. Pt performed stand<>pivot transfer to/from mat with bari RW and CGA. Pt reported feeling nauseous - offered diet ginger ale (cleared by RN) and symptoms improved.  Stood x 3 trials from EOM with bari RW and CGA/close supervision and worked on Energy manager with RUE with CGA for balance x 3 trials with extended rest breaks in between. 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 Discussed potentially having his dog come in to uplift spirits - recreational therapist to provide pt with paperwork. Strongly encouraged participation in amputee support group and pt planning to do so upon discharge. Pt then performed the following exercises with emphasis on UE strength: -bicep curls with 7lb dumbbell 2x10 -horizontal chest press with 7lb dumbbell 2x12 -tricep extensions with 7lb dumbbell 2x12 Stopped at vending machine and pt got diet drink (cleared by RN) and sunchips without assist. Pt requested to return to bed and  transferred WC<>bed stand<>pivot with bari RW and CGA and sit<>supine with supervision. Concluded session with pt semi-reclined in bed, needs within reach, and bed alarm on.   Therapy Documentation Precautions:  Precautions Precautions: Fall Precaution Comments: wound vac L residual limb Restrictions Weight Bearing Restrictions: No LLE Weight Bearing: Non weight bearing  Therapy/Group: Individual Therapy Marlana Salvage Zaunegger Blima Rich PT, DPT 08/05/2023, 7:03 AM

## 2023-08-05 NOTE — Progress Notes (Signed)
Occupational Therapy Session Note  Patient Details  Name: Adam Long MRN: [id number] Date of Birth: August 02, 1976  Today's Date: 08/05/2023 OT Individual Time: 6440-3474 OT Individual Time Calculation (min): 70 min    Short Term Goals: Week 1:  OT Short Term Goal 1 (Week 1): Pt will perform SB transfer with Mod A x1 to access BSC/WC. OT Short Term Goal 2 (Week 1): Pt will complete LB dressing with Mod A. OT Short Term Goal 3 (Week 1): Pt will complete 3/3 toileting activities with Mod A.  Skilled Therapeutic Interventions/Progress Updates:  Skilled OT intervention completed with focus on functional transfers, w/c mobility, BUE strengthening. Pt received supine in bed, agreeable to session. 7/10 pain reported in Lt residual limb; nurse notified of pain med request. OT offered rest breaks and repositioning throughout for pain reduction.  Pt verbalized fatigue, however agreeable to go outside. Transitioned to EOB with supervision with assist from elevated bed and bed rail. Total A to donn shoe. CGA sit > stand from elevated bed and min A stand pivot with RW > w/c. Nurse present for meds.  Pt self-propelled in w/c about 100 ft with mod I then OT took over and transported pt dependently in w/c <> outside courtyard to boost mood.  Seated in w/c, pt completed the following BUE exercises to promote BUE strength/endurance needed for independence with functional transfers and BADLs: (With red theraband) 2x15 reps Horizontal abduction Self-anchored shoulder flexion each arm Self-anchored bicep flexion each arm Self-anchored tricep extension each arm Shoulder external rotation -Pt enjoyed listening to 90's rock for motivation   Pt self-propelled in w/c with supervision on pavement about 100 ft total including slight incline and decline. Discussed home environment and ability to go outside for fresh air in w/c. Transported dependently in w/c rest of way.   Back in room, pt completed CGA sit >  stand and stand pivot > EOB with RW. Transitioned to upright in bed with supervision. Pt remained semi upright in bed, with bed alarm on/activated, and with all needs in reach at end of session.   Therapy Documentation Precautions:  Precautions Precautions: Fall Restrictions Weight Bearing Restrictions: Yes LLE Weight Bearing: Non weight bearing    Therapy/Group: Individual Therapy  Melvyn Novas, MS, OTR/L  08/05/2023, 3:29 PM

## 2023-08-05 NOTE — Progress Notes (Signed)
Occupational Therapy Session Note  Patient Details  Name: Adam Long MRN: [id number] Date of Birth: 1975-11-27  Today's Date: 08/05/2023 OT Individual Time: 0801-0900 OT Individual Time Calculation (min): 59 min    Short Term Goals: Week 1:  OT Short Term Goal 1 (Week 1): Pt will perform SB transfer with Mod A x1 to access BSC/WC. OT Short Term Goal 2 (Week 1): Pt will complete LB dressing with Mod A. OT Short Term Goal 3 (Week 1): Pt will complete 3/3 toileting activities with Mod A.  Skilled Therapeutic Interventions/Progress Updates:   Pt had multiple large Bms last evening and sheets soiled. OT planned for shower however L residual limb dressing soiled and bleeding. RN addressed dressing with OT providing support forn bed mobility. OT assisted pt with urinal use with set up and buttocks and peri region sponge bathing qith mod A and then donning pull on shorts with mod A bed level introducing reacher use for R LE. Supine to sit with CGA. SPT with HDRW to bari manual w/c with min A to R side. Sink side sponge bathing, dressing and grooming with set up. Left pt bedside in w/c with all needs and safety measures with NT making bed.   Pain: 8/10 with L residual limb dressing re-dressed by nursing and meds given, post ADL's and OOB pt at 6/10   Therapy Documentation Precautions:  Precautions Precautions: Fall Precaution Comments: wound vac L residual limb Restrictions Weight Bearing Restrictions: No LLE Weight Bearing: Non weight bearing   Therapy/Group: Individual Therapy  Vicenta Dunning 08/05/2023, 7:48 AM

## 2023-08-05 NOTE — Progress Notes (Addendum)
PROGRESS NOTE   Subjective/Complaints: Patient feels like he is energy is a little better after having blood transfusion yesterday.  Reports he had multiple bowel movements yesterday.  He also had a large bowel movement on 11/6.  He reports his pain is under control.  ROS: Patient denies fever, abdominal pain, shortness of breath, chest pain, new motor or sensory changes +phantom pain-continued  Objective:   No results found. Recent Labs    08/04/23 0547 08/05/23 0457  WBC 10.0 8.2  HGB 6.6* 7.0*  HCT 22.8* 24.1*  PLT 392 371   Recent Labs    08/04/23 0547  NA 130*  K 4.1  CL 95*  CO2 27  GLUCOSE 141*  BUN 22*  CREATININE 0.96  CALCIUM 8.6*    Intake/Output Summary (Last 24 hours) at 08/05/2023 1337 Last data filed at 08/05/2023 1317 Gross per 24 hour  Intake 1724 ml  Output 3500 ml  Net -1776 ml        Physical Exam: Vital Signs Blood pressure 107/60, pulse 100, temperature 98.1 F (36.7 C), temperature source Oral, resp. rate 18, height 5\' 10"  (1.778 m), weight (!) 156.2 kg, SpO2 97%.   General: No acute distress, sitting in wheelchair HEENT: Head is normocephalic, atraumatic, MMM Heart: Reg rate and rhythm.  Chest: CTA bilaterally without wheezes, rales, or rhonchi; no distress Abdomen: Soft, non-tender, nondistended, bowel sounds positive Extremities:  Psych: Pt's affect is a little flat, cooperative Skin: Clean and intact without signs of breakdown Neuro:  Mental Status: He is alert and oriented to person, place, and time. Mental status is at baseline.     Comments: Decreased to light touch in RLE from knee downwards  Musculoskeletal:   Cervical back: Neck supple. No tenderness.     Comments: Ues 5/5 in biceps, triceps, WE, grip and FA B/L RLE- 5-/5 in HF, KE, DF and PF LLE- 3/5 in HF L AKA -with dry dressing sock in place Skin:    General: Skin is warm and dry.     Comments: Venous stasis  changes on RLE from mid calf downwards- and 1-2+ LE edema from R ankle     Assessment/Plan: 1. Functional deficits which require 3+ hours per day of interdisciplinary therapy in a comprehensive inpatient rehab setting. Physiatrist is providing close team supervision and 24 hour management of active medical problems listed below. Physiatrist and rehab team continue to assess barriers to discharge/monitor patient progress toward functional and medical goals  Care Tool:  Bathing    Body parts bathed by patient: Right arm, Left arm, Chest, Abdomen, Front perineal area, Right upper leg, Face   Body parts bathed by helper: Buttocks, Right lower leg, Left upper leg Body parts n/a: Left lower leg   Bathing assist Assist Level: Moderate Assistance - Patient 50 - 74%     Upper Body Dressing/Undressing Upper body dressing   What is the patient wearing?: Pull over shirt    Upper body assist Assist Level: Set up assist    Lower Body Dressing/Undressing Lower body dressing      What is the patient wearing?:  (Shorts)     Lower body assist Assist for lower body  dressing: Maximal Assistance - Patient 25 - 49%     Toileting Toileting Toileting Activity did not occur (Clothing management and hygiene only): N/A (no void or bm)  Toileting assist Assist for toileting: Moderate Assistance - Patient 50 - 74%     Transfers Chair/bed transfer  Transfers assist  Chair/bed transfer activity did not occur: Safety/medical concerns  Chair/bed transfer assist level: Contact Guard/Touching assist     Locomotion Ambulation   Ambulation assist   Ambulation activity did not occur: Safety/medical concerns          Walk 10 feet activity   Assist  Walk 10 feet activity did not occur: Safety/medical concerns        Walk 50 feet activity   Assist Walk 50 feet with 2 turns activity did not occur: Safety/medical concerns         Walk 150 feet activity   Assist Walk 150  feet activity did not occur: Safety/medical concerns         Walk 10 feet on uneven surface  activity   Assist Walk 10 feet on uneven surfaces activity did not occur: Safety/medical concerns         Wheelchair     Assist Is the patient using a wheelchair?: Yes Type of Wheelchair: Manual Wheelchair activity did not occur: Safety/medical concerns         Wheelchair 50 feet with 2 turns activity    Assist    Wheelchair 50 feet with 2 turns activity did not occur: Safety/medical concerns       Wheelchair 150 feet activity     Assist  Wheelchair 150 feet activity did not occur: Safety/medical concerns       Blood pressure 107/60, pulse 100, temperature 98.1 F (36.7 C), temperature source Oral, resp. rate 18, height 5\' 10"  (1.778 m), weight (!) 156.2 kg, SpO2 97%.  Medical Problem List and Plan: 1. Functional deficits secondary to infected L BKA s/p AKA on 07/27/23              -patient may not shower until VAC removed             -ELOS/Goals: 10-12 days- supervision to min A at w/c level  -Continue CIR  -Expected discharge 11/20  -Patient did not tolerate shrinker, was able to tolerate large sock, will work towards shrinker  2.  Antithrombotics: -DVT/anticoagulation:  Pharmaceutical: Lovenox 150 mg BID until Coumadin/INR therapeutic             -antiplatelet therapy: n/a 3. Pain Management: chronic pain and neuropathy-  Con't tylenol prn, Oxycodone 5-15 mg q4 hours prn and gabapentin  scheduled- as well as Robaxin prn  -11/4 increase gabapentin to 600 mg 3 times daily from twice daily  11/8 increase gabapentin to 800 mg 3 times daily for phantom pain , mirror therapy  4. Mood/Behavior/Sleep/Anxiety/Depression:              -Continue sertraline 50mg  daily and Gabapentin 600mg  BID             Also team milieu to help with mood and anxiety -antipsychotic agents: n/a 5. Neuropsych/cognition: This patient is capable of making decisions on his own  behalf. 6. Skin/Wound Care: has VAC -for 7 days from 07/27/23  -11/5 wound VAC was removed yesterday 7. Fluids/Electrolytes/Nutrition/chronic hyponatremia: current Na 132- will recheck in AM             -Continue vitamins and supplements 8. Diabetes type 2 with polyneuropathy, poorly controlled,  Hgb A1C 10.2: SSI, increased semglee to 35U on d/c from acute, metformin to be resumed upon admission to CIR 1000 mg (home dose)  BID-AC  -11/5 Increase semglee to 38 u BID as CBGs have been a little elevated  -11/6 monitor response to medication change today, CBGs a little better today  -11/7 CBGs fairly controlled, continue current regimen  -11/8 CBGs controlled continue current regimen CBG (last 3)  Recent Labs    08/04/23 2141 08/05/23 0631 08/05/23 1128  GLUCAP 149* 180* 194*    9. Acute on chronic anemia: Hgb 7.4 after 1U PRBCs on 11/1, transfuse if <7  -11/4 patient was noted to have some slight bleeding around his backside.  Overall hemoglobin stable at 7.9 today.  -11/5 bleeding appears to be improved, recheck CBC Thursday  -11/7 Transfuse 1 U PRBC due to anemia with hemoglobin 6.6.  Discussed with pharmacy, pharmacy will stop Lovenox bridging since INR likely to be therapeutic in the morning.  -11/8 recheck with Hgb 7.0 this morning, will recheck again today- 7.1,FOBT pending 10. HTN: continue metoprolol 25mg  BID and lisinopril 20mg  daily  -11/7 BP a little soft at times, decrease lisinopril to 10 mg daily  -11/8 will stop lisinopril for now    08/05/2023    1:17 PM 08/05/2023    5:21 AM 08/04/2023    8:16 PM  Vitals with BMI  Systolic 107 99 93  Diastolic 60 63 52  Pulse 100 85 97     11. HLD: continue zetia 10mg  daily 12. CAD and mechanical aortic valve: on coumadin resumed on 07/28/23, pharmacy following, bridging with lovenox 13. GERD: continue protonix 40mg  daily 14. Morbid obesity: provide dietary education 15. Thrombocytosis: likely reactive, recheck  tomorrow  -Improved to 371 on 11/8 16. MRSA positive nasal swab: mupirocin BID 17. L BKA stump infection- done with IV ABX since amputation removed infection. .  18. Microscopic hematuria- has nonobstructive renal stones- monitor clinically.  19.  Constipation.  Improved after bowel movements on 11/6 and 11/7  -11/8 will start MiraLAX daily to try to prevent further constipation 20. Azotemia  -Will stop lisinopril and monitor  LOS: 5 days A FACE TO FACE EVALUATION WAS PERFORMED  Fanny Dance 08/05/2023, 1:37 PM

## 2023-08-06 DIAGNOSIS — I10 Essential (primary) hypertension: Secondary | ICD-10-CM | POA: Diagnosis not present

## 2023-08-06 DIAGNOSIS — S78112A Complete traumatic amputation at level between left hip and knee, initial encounter: Secondary | ICD-10-CM | POA: Diagnosis not present

## 2023-08-06 DIAGNOSIS — R739 Hyperglycemia, unspecified: Secondary | ICD-10-CM

## 2023-08-06 LAB — CBC
HCT: 24.8 % — ABNORMAL LOW (ref 39.0–52.0)
Hemoglobin: 7.4 g/dL — ABNORMAL LOW (ref 13.0–17.0)
MCH: 22 pg — ABNORMAL LOW (ref 26.0–34.0)
MCHC: 29.8 g/dL — ABNORMAL LOW (ref 30.0–36.0)
MCV: 73.6 fL — ABNORMAL LOW (ref 80.0–100.0)
Platelets: 375 10*3/uL (ref 150–400)
RBC: 3.37 MIL/uL — ABNORMAL LOW (ref 4.22–5.81)
RDW: 17.5 % — ABNORMAL HIGH (ref 11.5–15.5)
WBC: 8.2 10*3/uL (ref 4.0–10.5)
nRBC: 0.2 % (ref 0.0–0.2)

## 2023-08-06 LAB — BASIC METABOLIC PANEL
Anion gap: 11 (ref 5–15)
BUN: 15 mg/dL (ref 6–20)
CO2: 26 mmol/L (ref 22–32)
Calcium: 9.4 mg/dL (ref 8.9–10.3)
Chloride: 97 mmol/L — ABNORMAL LOW (ref 98–111)
Creatinine, Ser: 0.8 mg/dL (ref 0.61–1.24)
GFR, Estimated: >60 mL/min (ref >60)
Glucose, Bld: 164 mg/dL — ABNORMAL HIGH (ref 70–99)
Potassium: 4.8 mmol/L (ref 3.5–5.1)
Sodium: 134 mmol/L — ABNORMAL LOW (ref 135–145)

## 2023-08-06 LAB — GLUCOSE, CAPILLARY
Glucose-Capillary: 181 mg/dL — ABNORMAL HIGH (ref 70–99)
Glucose-Capillary: 166 mg/dL — ABNORMAL HIGH (ref 70–99)
Glucose-Capillary: 135 mg/dL — ABNORMAL HIGH (ref 70–99)
Glucose-Capillary: 99 mg/dL (ref 70–99)

## 2023-08-06 LAB — PROTIME-INR
INR: 1.9 — ABNORMAL HIGH (ref 0.8–1.2)
Prothrombin Time: 22.2 s — ABNORMAL HIGH (ref 11.4–15.2)

## 2023-08-06 MED ORDER — WARFARIN SODIUM 2.5 MG PO TABS
12.5000 mg | ORAL_TABLET | Freq: Once | ORAL | Status: AC
  Administered 2023-08-06: 12.5 mg via ORAL
  Filled 2023-08-06: qty 5

## 2023-08-06 NOTE — Progress Notes (Signed)
PROGRESS NOTE   Subjective/Complaints:  Pt doing well, slept ok, pain well managed, LBM last night, urinating fine, denies any other complaints or concerns today.   ROS: Patient denies fever, abdominal pain, n/v/d, shortness of breath, chest pain, new motor or sensory changes +phantom pain-continued  Objective:   No results found. Recent Labs    08/05/23 0457 08/05/23 1352 08/06/23 0512  WBC 8.2  --  8.2  HGB 7.0* 7.1* 7.4*  HCT 24.1* 23.1* 24.8*  PLT 371  --  375   Recent Labs    08/04/23 0547 08/06/23 0512  NA 130* 134*  K 4.1 4.8  CL 95* 97*  CO2 27 26  GLUCOSE 141* 164*  BUN 22* 15  CREATININE 0.96 0.80  CALCIUM 8.6* 9.4    Intake/Output Summary (Last 24 hours) at 08/06/2023 1132 Last data filed at 08/06/2023 1100 Gross per 24 hour  Intake 957 ml  Output 1900 ml  Net -943 ml        Physical Exam: Vital Signs Blood pressure 115/83, pulse 85, temperature 97.7 F (36.5 C), resp. rate 18, height 5\' 10"  (1.778 m), weight (!) 156.2 kg, SpO2 98%.   General: No acute distress, sitting in bed HEENT: Head is normocephalic, atraumatic, MMM Heart: Reg rate and rhythm. No m/r/g appreciated Chest: CTA bilaterally without wheezes, rales, or rhonchi; no distress Abdomen: Soft, non-tender, nondistended, bowel sounds positive Extremities: RLE with trace edema and venous stasis chages, L AKA Psych: Pt's affect is a little flat, cooperative Skin: Clean and intact without signs of breakdown, L AKA with sock, incision not assessed today.   PRIOR EXAMS: Neuro:  Mental Status: He is alert and oriented to person, place, and time. Mental status is at baseline.     Comments: Decreased to light touch in RLE from knee downwards  Musculoskeletal:   Cervical back: Neck supple. No tenderness.     Comments: Ues 5/5 in biceps, triceps, WE, grip and FA B/L RLE- 5-/5 in HF, KE, DF and PF LLE- 3/5 in HF L AKA -with dry  dressing sock in place Skin:    General: Skin is warm and dry.     Comments: Venous stasis changes on RLE from mid calf downwards- and 1-2+ LE edema from R ankle     Assessment/Plan: 1. Functional deficits which require 3+ hours per day of interdisciplinary therapy in a comprehensive inpatient rehab setting. Physiatrist is providing close team supervision and 24 hour management of active medical problems listed below. Physiatrist and rehab team continue to assess barriers to discharge/monitor patient progress toward functional and medical goals  Care Tool:  Bathing    Body parts bathed by patient: Right arm, Left arm, Chest, Abdomen, Front perineal area, Right upper leg, Face   Body parts bathed by helper: Buttocks, Right lower leg, Left upper leg Body parts n/a: Left lower leg   Bathing assist Assist Level: Moderate Assistance - Patient 50 - 74%     Upper Body Dressing/Undressing Upper body dressing   What is the patient wearing?: Pull over shirt    Upper body assist Assist Level: Set up assist    Lower Body Dressing/Undressing Lower body dressing  What is the patient wearing?:  (Shorts)     Lower body assist Assist for lower body dressing: Maximal Assistance - Patient 25 - 49%     Toileting Toileting Toileting Activity did not occur (Clothing management and hygiene only): N/A (no void or bm)  Toileting assist Assist for toileting: Moderate Assistance - Patient 50 - 74%     Transfers Chair/bed transfer  Transfers assist  Chair/bed transfer activity did not occur: Safety/medical concerns  Chair/bed transfer assist level: Contact Guard/Touching assist     Locomotion Ambulation   Ambulation assist   Ambulation activity did not occur: Safety/medical concerns          Walk 10 feet activity   Assist  Walk 10 feet activity did not occur: Safety/medical concerns        Walk 50 feet activity   Assist Walk 50 feet with 2 turns activity did  not occur: Safety/medical concerns         Walk 150 feet activity   Assist Walk 150 feet activity did not occur: Safety/medical concerns         Walk 10 feet on uneven surface  activity   Assist Walk 10 feet on uneven surfaces activity did not occur: Safety/medical concerns         Wheelchair     Assist Is the patient using a wheelchair?: Yes Type of Wheelchair: Manual Wheelchair activity did not occur: Safety/medical concerns         Wheelchair 50 feet with 2 turns activity    Assist    Wheelchair 50 feet with 2 turns activity did not occur: Safety/medical concerns       Wheelchair 150 feet activity     Assist  Wheelchair 150 feet activity did not occur: Safety/medical concerns       Blood pressure 115/83, pulse 85, temperature 97.7 F (36.5 C), resp. rate 18, height 5\' 10"  (1.778 m), weight (!) 156.2 kg, SpO2 98%.  Medical Problem List and Plan: 1. 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  Antithrombotics: -DVT/anticoagulation:  Pharmaceutical: Lovenox 150 mg BID until Coumadin/INR therapeutic>> now on coumadin only             -antiplatelet therapy: n/a 3. Pain Management: chronic pain and neuropathy-  Con't tylenol prn, Oxycodone 5-15 mg q4 hours prn and gabapentin  scheduled- as well as Robaxin prn  -11/4 increase gabapentin to 600 mg 3 times daily from twice daily 11/8 increase gabapentin to 800 mg 3 times daily for phantom pain , mirror therapy  4. Mood/Behavior/Sleep/Anxiety/Depression:              -Continue sertraline 50mg  daily and Gabapentin 600mg  BID             Also team milieu to help with mood and anxiety -antipsychotic agents:  n/a  5. Neuropsych/cognition: This patient is capable of making decisions on his own behalf. 6. Skin/Wound Care: has VAC -for 7 days from 07/27/23  -Continue vitamins -11/5 wound VAC was removed yesterday   7. Fluids/Electrolytes/Nutrition/chronic hyponatremia: current Na 132- will recheck in AM             -  Continue vitamins and supplements  -08/06/23 Na 134, monitor  8. Diabetes type 2 with polyneuropathy, poorly controlled, Hgb A1C 10.2: SSI, increased semglee to 35U on d/c from acute, metformin to be resumed upon admission to CIR 1000 mg (home dose) BID-AC  -11/5 Increase semglee to 38 u daily as CBGs have been a little elevated -11/6 monitor response to medication change today, CBGs a little better today  -11/7 CBGs fairly controlled, continue current regimen  -11/9 CBGs better controlled, continue current regimen CBG (last 3)  Recent Labs    08/05/23 2105 08/06/23 0817 08/06/23 1126  GLUCAP 160* 181* 166*    9. Acute on chronic anemia: Hgb 7.4 after 1U PRBCs on 11/1, transfuse if <7 -11/4 patient was noted to have some slight bleeding around his backside.  Overall hemoglobin stable at 7.9 today.  -11/5 bleeding appears to be improved, recheck CBC Thursday -11/7 Transfuse 1 U PRBC due to anemia with hemoglobin 6.6.  Discussed with pharmacy, pharmacy will stop Lovenox bridging since INR likely to be therapeutic in the morning. -11/8 recheck with Hgb 7.0 this morning, will recheck again today- 7.1,FOBT pending -08/06/23 Hgb 7.4 today; FOBT not done. Recheck on Monday.   10. HTN: continue metoprolol 25mg  BID and lisinopril 20mg  daily  -11/7 BP a little soft at times, decrease lisinopril to 10 mg daily  -11/8 will stop lisinopril for now  -08/06/23 BPs soft but stable, cont regimen for now.  Vitals:   08/03/23 1400 08/03/23 1941 08/03/23 2110 08/04/23 0458  BP: (!) 97/51 (!) 94/59 (!) 106/45 102/63   08/04/23 1446 08/04/23 1621 08/04/23 1653 08/04/23 2016  BP: (!) 104/59 (!)  120/57 (!) 111/40 (!) 93/52   08/05/23 0521 08/05/23 1317 08/05/23 1940 08/06/23 0502  BP: 99/63 107/60 (!) 99/57 115/83     11. HLD: continue zetia 10mg  daily 12. CAD and mechanical aortic valve: on coumadin resumed on 07/28/23, pharmacy following, bridging with lovenox>> now off lovenox 13. GERD: continue protonix 40mg  daily 14. Morbid obesity: provide dietary education 15. Thrombocytosis: likely reactive, recheck tomorrow  -Improved to 371 on 11/8  -08/06/23 no 375, monitor routinely 16. MRSA positive nasal swab: mupirocin BID 17. L BKA stump infection- done with IV ABX since amputation removed infection. .  18. Microscopic hematuria- has nonobstructive renal stones- monitor clinically.  19.  Constipation.  Improved after bowel movements on 11/6 and 11/7  -11/8 will start MiraLAX daily to try to prevent further constipation  -08/06/23 LBM last night, cont regimen 20. Azotemia  -Will stop lisinopril and monitor  LOS: 6 days A FACE TO FACE EVALUATION WAS PERFORMED  [address] 08/06/2023, 11:32 AM

## 2023-08-06 NOTE — Progress Notes (Signed)
PHARMACY - ANTICOAGULATION CONSULT NOTE  Pharmacy Consult for Warfarin Indication: AVR replacement  No Known Allergies  Patient Measurements: Height: 5\' 10"  (177.8 cm) Weight: (!) 156.2 kg (344 lb 5.7 oz) IBW/kg (Calculated) : 73 Heparin Dosing Weight: 113.5  Vital Signs: Temp: 97.7 F (36.5 C) (11/09 0502) BP: 115/83 (11/09 0502) Pulse Rate: 85 (11/09 0502)  Labs: Recent Labs    08/04/23 0547 08/05/23 0457 08/05/23 1352 08/06/23 0512  HGB 6.6* 7.0* 7.1* 7.4*  HCT 22.8* 24.1* 23.1* 24.8*  PLT 392 371  --  375  LABPROT 20.9* 21.5*  --  22.2*  INR 1.8* 1.8*  --  1.9*  CREATININE 0.96  --   --  0.80    Estimated Creatinine Clearance: 171.6 mL/min (by C-G formula based on SCr of 0.8 mg/dL).  Assessment: 47 yo male admitted for L BKA stump infection. Patient is on warfarin PTA for history of mechanical AVR.  PTA warfarin regimen: 5mg  MWF, 10mg  all other days.   Warfarin resumed 10/31 after BKA, held x 2 days  INR 1.9. Lovenox stopped yesterday for drop in Hgb. Hgb stable overnight with no reported s/sx of bleeding.   PTA warfarin regimen: warfarin 5 mg MWF and warfarin10 mg all other days  Goal of Therapy:  INR: 2-3 Anti-Xa: 0.6-1 Monitor platelets by anticoagulation protocol: Yes   Plan:  Warfarin 12.5 mg po x 1 Monitor daily INR and CBC  [name], PharmD, BCIDP, AAHIVP, CPP Infectious Disease Pharmacist 08/06/2023 10:02 AM

## 2023-08-07 DIAGNOSIS — R739 Hyperglycemia, unspecified: Secondary | ICD-10-CM | POA: Diagnosis not present

## 2023-08-07 DIAGNOSIS — S78112A Complete traumatic amputation at level between left hip and knee, initial encounter: Secondary | ICD-10-CM | POA: Diagnosis not present

## 2023-08-07 DIAGNOSIS — I10 Essential (primary) hypertension: Secondary | ICD-10-CM | POA: Diagnosis not present

## 2023-08-07 LAB — PROTIME-INR
INR: 2.1 — ABNORMAL HIGH (ref 0.8–1.2)
Prothrombin Time: 23.7 s — ABNORMAL HIGH (ref 11.4–15.2)

## 2023-08-07 LAB — GLUCOSE, CAPILLARY
Glucose-Capillary: 140 mg/dL — ABNORMAL HIGH (ref 70–99)
Glucose-Capillary: 136 mg/dL — ABNORMAL HIGH (ref 70–99)
Glucose-Capillary: 151 mg/dL — ABNORMAL HIGH (ref 70–99)
Glucose-Capillary: 153 mg/dL — ABNORMAL HIGH (ref 70–99)

## 2023-08-07 MED ORDER — WARFARIN SODIUM 2.5 MG PO TABS
10.0000 mg | ORAL_TABLET | Freq: Every day | ORAL | Status: DC
  Administered 2023-08-07 – 2023-08-16 (×10): 10 mg via ORAL
  Filled 2023-08-07 (×10): qty 4

## 2023-08-07 MED ORDER — ORAL CARE MOUTH RINSE: 15.0000 mL | OROMUCOSAL | Status: DC | PRN

## 2023-08-07 NOTE — Progress Notes (Signed)
Occupational Therapy Weekly Progress Note  Patient Details  Name: [name] MRN: [id number] Date of Birth: December 13, 1975  Beginning of progress report period: August 01, 2023 End of progress report period: August 07, 2023  Today's Date: 08/07/2023 OT Individual Time: 0865-7846 OT Individual Time Calculation (min): 58 min    Patient has met 3 of 3 short term goals this reporting period. Pt currently functioning at setup/supervision for UB care and Min A LB dressing and toileting. Pt greatly limited by functional reach and pain at residual limb. Pt to trial AE for LB care as appropriate.   Patient continues to demonstrate the following deficits: muscle weakness, decreased cardiorespiratoy endurance, and decreased standing balance and decreased balance strategies and therefore will continue to benefit from skilled OT intervention to enhance overall performance with BADL and Reduce care partner burden.  Patient progressing toward long term goals..  Continue plan of care.  OT Short Term Goals Week 1:  OT Short Term Goal 1 (Week 1): Pt will perform SB transfer with Mod A x1 to access BSC/WC. (Pt progressing away from SB transfer.) OT Short Term Goal 1 - Progress (Week 1): Discontinued (comment) OT Short Term Goal 2 (Week 1): Pt will complete LB dressing with Mod A. OT Short Term Goal 2 - Progress (Week 1): Met OT Short Term Goal 3 (Week 1): Pt will complete 3/3 toileting activities with Mod A. OT Short Term Goal 3 - Progress (Week 1): Met Week 2:  OT Short Term Goal 1 (Week 2): STGs=LTGs due to patient's length of stay.  Skilled Therapeutic Interventions/Progress Updates:  Pt greeted resting in bed for skilled OT session with focus on BADL retraining.   Pain: Pt reported 6/10 pain at residual limb, pre-medicated. OT offering intermediate rest breaks and positioning suggestions throughout session to address pain/fatigue and maximize participation/safety in session.   Functional  Transfers: Pt comes to EOB with supervision + HOB/bed rails. Pt performs stand-hop transfer from EOB<> Bariatric BSC with CGA, bed elevated per patient request for initial STS. Pt with improved RLE clearance during transfer.   Self Care Tasks: Time dedicated to retrieving appropriately sized BSC. Pt completes 3/3 toileting activities with A for peri-care, managing LB garments with CGA + RW in standing. Peri-care intiated during multiple standing trials, each lasting ~2 mins, pt returned to supine and rolling for thouroughness due to body habitus. White sock/shinker dependently washed and changed for skin integrity/wound healing.  Pt remained resting in bed with 4Ps assessed and immediate needs met. Pt continues to be appropriate for skilled OT intervention to promote further functional independence in ADLs/IADLs.   Therapy Documentation Precautions:  Precautions Precautions: Fall Precaution Comments: wound vac L residual limb Restrictions Weight Bearing Restrictions: Yes LLE Weight Bearing: Non weight bearing   Therapy/Group: Individual Therapy  Lou Cal, OTR/L, MSOT  08/07/2023, 5:47 AM

## 2023-08-07 NOTE — Progress Notes (Signed)
PROGRESS NOTE   Subjective/Complaints:  Pt doing well again today, slept ok, pain well controlled, LBM yesterday, urinating fine, denies any other complaints or concerns today.   ROS: Patient denies fever, abdominal pain, n/v/d, shortness of breath, chest pain, new motor or sensory changes +phantom pain-continued  Objective:   No results found. Recent Labs    08/05/23 0457 08/05/23 1352 08/06/23 0512  WBC 8.2  --  8.2  HGB 7.0* 7.1* 7.4*  HCT 24.1* 23.1* 24.8*  PLT 371  --  375   Recent Labs    08/06/23 0512  NA 134*  K 4.8  CL 97*  CO2 26  GLUCOSE 164*  BUN 15  CREATININE 0.80  CALCIUM 9.4    Intake/Output Summary (Last 24 hours) at 08/07/2023 0830 Last data filed at 08/07/2023 0620 Gross per 24 hour  Intake 1189 ml  Output 3600 ml  Net -2411 ml        Physical Exam: Vital Signs Blood pressure 114/64, pulse 84, temperature 98.8 F (37.1 C), temperature source Oral, resp. rate 18, height 5\' 10"  (1.778 m), weight (!) 156.2 kg, SpO2 97%.   General: No acute distress, laying in bed, finished breakfast HEENT: Head is normocephalic, atraumatic, MMM Heart: Reg rate and rhythm. Mechanical heart valve sound Chest: CTA bilaterally without wheezes, rales, or rhonchi; no distress Abdomen: Soft, non-tender, nondistended, bowel sounds positive Extremities: RLE with trace edema and venous stasis chages, L AKA Psych: Pt's affect is a little flat, cooperative Skin: Clean and intact without signs of breakdown, L AKA with sock, incision not assessed today.   PRIOR EXAMS: Neuro:  Mental Status: He is alert and oriented to person, place, and time. Mental status is at baseline.     Comments: Decreased to light touch in RLE from knee downwards  Musculoskeletal:   Cervical back: Neck supple. No tenderness.     Comments: Ues 5/5 in biceps, triceps, WE, grip and FA B/L RLE- 5-/5 in HF, KE, DF and PF LLE- 3/5 in HF L  AKA -with dry dressing sock in place Skin:    General: Skin is warm and dry.     Comments: Venous stasis changes on RLE from mid calf downwards- and 1-2+ LE edema from R ankle     Assessment/Plan: 1. Functional deficits which require 3+ hours per day of interdisciplinary therapy in a comprehensive inpatient rehab setting. Physiatrist is providing close team supervision and 24 hour management of active medical problems listed below. Physiatrist and rehab team continue to assess barriers to discharge/monitor patient progress toward functional and medical goals  Care Tool:  Bathing    Body parts bathed by patient: Right arm, Left arm, Chest, Abdomen, Front perineal area, Right upper leg, Face   Body parts bathed by helper: Buttocks, Right lower leg, Left upper leg Body parts n/a: Left lower leg   Bathing assist Assist Level: Moderate Assistance - Patient 50 - 74%     Upper Body Dressing/Undressing Upper body dressing   What is the patient wearing?: Pull over shirt    Upper body assist Assist Level: Set up assist    Lower Body Dressing/Undressing Lower body dressing  What is the patient wearing?:  (Shorts)     Lower body assist Assist for lower body dressing: Maximal Assistance - Patient 25 - 49%     Toileting Toileting Toileting Activity did not occur (Clothing management and hygiene only): N/A (no void or bm)  Toileting assist Assist for toileting: Moderate Assistance - Patient 50 - 74%     Transfers Chair/bed transfer  Transfers assist  Chair/bed transfer activity did not occur: Safety/medical concerns  Chair/bed transfer assist level: Contact Guard/Touching assist     Locomotion Ambulation   Ambulation assist   Ambulation activity did not occur: Safety/medical concerns          Walk 10 feet activity   Assist  Walk 10 feet activity did not occur: Safety/medical concerns        Walk 50 feet activity   Assist Walk 50 feet with 2 turns  activity did not occur: Safety/medical concerns         Walk 150 feet activity   Assist Walk 150 feet activity did not occur: Safety/medical concerns         Walk 10 feet on uneven surface  activity   Assist Walk 10 feet on uneven surfaces activity did not occur: Safety/medical concerns         Wheelchair     Assist Is the patient using a wheelchair?: Yes Type of Wheelchair: Manual Wheelchair activity did not occur: Safety/medical concerns         Wheelchair 50 feet with 2 turns activity    Assist    Wheelchair 50 feet with 2 turns activity did not occur: Safety/medical concerns       Wheelchair 150 feet activity     Assist  Wheelchair 150 feet activity did not occur: Safety/medical concerns       Blood pressure 114/64, pulse 84, temperature 98.8 F (37.1 C), temperature source Oral, resp. rate 18, height 5\' 10"  (1.778 m), weight (!) 156.2 kg, SpO2 97%.  Medical Problem List and Plan: 1. 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  Antithrombotics: -DVT/anticoagulation:  Pharmaceutical: Lovenox 150 mg BID until Coumadin/INR therapeutic>> now on coumadin only             -antiplatelet therapy: n/a 3. Pain Management: chronic pain and neuropathy-  Con't tylenol prn, Oxycodone 5-15 mg q4 hours prn and gabapentin  scheduled- as well as Robaxin prn  -11/4 increase gabapentin to 600 mg 3 times daily from twice daily 11/8 increase gabapentin to 800 mg 3 times daily for phantom pain , mirror therapy  4. Mood/Behavior/Sleep/Anxiety/Depression:              -Continue sertraline 50mg  daily and Gabapentin 600mg  BID             Also team milieu to help with mood  and anxiety -antipsychotic agents: n/a  5. Neuropsych/cognition: This patient is capable of making decisions on his own behalf. 6. Skin/Wound Care: has VAC -for 7 days from 07/27/23  -Continue vitamins -11/5 wound VAC was removed yesterday   7. Fluids/Electrolytes/Nutrition/chronic hyponatremia: current Na 132- will recheck in  AM             -Continue vitamins and supplements  -08/06/23 Na 134, monitor  8. Diabetes type 2 with polyneuropathy, poorly controlled, Hgb A1C 10.2: SSI, increased semglee to 35U on d/c from acute, metformin to be resumed upon admission to CIR 1000 mg (home dose) BID-AC  -11/5 Increase semglee to 38 u daily as CBGs have been a little elevated -11/6 monitor response to medication change today, CBGs a little better today  -11/7 CBGs fairly controlled, continue current regimen  -11/9-10 CBGs better controlled, continue current regimen CBG (last 3)  Recent Labs    08/06/23 1707 08/06/23 2051 08/07/23 0543  GLUCAP 135* 99 140*    9. Acute on chronic anemia: Hgb 7.4 after 1U PRBCs on 11/1, transfuse if <7 -11/4 patient was noted to have some slight bleeding around his backside.  Overall hemoglobin stable at 7.9 today.  -11/5 bleeding appears to be improved, recheck CBC Thursday -11/7 Transfuse 1 U PRBC due to anemia with hemoglobin 6.6.  Discussed with pharmacy, pharmacy will stop Lovenox bridging since INR likely to be therapeutic in the morning. -11/8 recheck with Hgb 7.0 this morning, will recheck again today- 7.1,FOBT pending -08/06/23 Hgb 7.4 today; FOBT not done. Recheck on Monday.   10. HTN: continue metoprolol 25mg  BID and lisinopril 20mg  daily  -11/7 BP a little soft at times, decrease lisinopril to 10 mg daily  -11/8 will stop lisinopril for now  -08/06/23 BPs soft but stable, cont regimen for now.   -08/07/23 BPs improving, monitor Vitals:   08/04/23 0458 08/04/23 1446 08/04/23 1621 08/04/23 1653  BP: 102/63 (!) 104/59 (!) 120/57 (!) 111/40    08/04/23 2016 08/05/23 0521 08/05/23 1317 08/05/23 1940  BP: (!) 93/52 99/63 107/60 (!) 99/57   08/06/23 0502 08/06/23 1313 08/06/23 1832 08/07/23 0341  BP: 115/83 127/69 129/75 114/64     11. HLD: continue zetia 10mg  daily 12. CAD and mechanical aortic valve: on coumadin resumed on 07/28/23, pharmacy following, bridging with lovenox>> now off lovenox 13. GERD: continue protonix 40mg  daily 14. Morbid obesity: provide dietary education 15. Thrombocytosis: likely reactive, recheck tomorrow  -Improved to 371 on 11/8  -08/06/23 no 375, monitor routinely 16. MRSA positive nasal swab: mupirocin BID 17. L BKA stump infection- done with IV ABX since amputation removed infection. .  18. Microscopic hematuria- has nonobstructive renal stones- monitor clinically.  19.  Constipation.  Improved after bowel movements on 11/6 and 11/7  -11/8 will start MiraLAX daily to try to prevent further constipation  -08/07/23 LBM yesterday, cont regimen 20. Azotemia  -Will stop lisinopril and monitor  LOS: 7 days A FACE TO FACE EVALUATION WAS PERFORMED  [address] Lashya Passe 08/07/2023, 8:30 AM

## 2023-08-07 NOTE — Progress Notes (Signed)
PHARMACY - ANTICOAGULATION CONSULT NOTE  Pharmacy Consult for Warfarin Indication: AVR replacement  No Known Allergies  Patient Measurements: Height: 5\' 10"  (177.8 cm) Weight: (!) 156.2 kg (344 lb 5.7 oz) IBW/kg (Calculated) : 73 Heparin Dosing Weight: 113.5  Vital Signs: Temp: 98.8 F (37.1 C) (11/10 0341) Temp Source: Oral (11/10 0341) BP: 114/64 (11/10 0341) Pulse Rate: 84 (11/10 0341)  Labs: Recent Labs    08/05/23 0457 08/05/23 1352 08/06/23 0512 08/07/23 0503  HGB 7.0* 7.1* 7.4*  --   HCT 24.1* 23.1* 24.8*  --   PLT 371  --  375  --   LABPROT 21.5*  --  22.2* 23.7*  INR 1.8*  --  1.9* 2.1*  CREATININE  --   --  0.80  --     Estimated Creatinine Clearance: 171.6 mL/min (by C-G formula based on SCr of 0.8 mg/dL).  Assessment: 47 yo male admitted for L BKA stump infection. Patient is on warfarin PTA for history of mechanical AVR.  PTA warfarin regimen: 5mg  MWF, 10mg  all other days.   Warfarin resumed 10/31 after BKA, held x 2 days  INR therapeutic at 2.1. Hgb stable overnight with no reported s/sx of bleeding. Will try standing dose today.   PTA warfarin regimen: warfarin 5 mg MWF and warfarin10 mg all other days  Goal of Therapy:  INR: 2-3 Anti-Xa: 0.6-1 Monitor platelets by anticoagulation protocol: Yes   Plan:  Warfarin 10 mg qday Monitor daily INR and CBC  [name], PharmD, BCIDP, AAHIVP, CPP Infectious Disease Pharmacist 08/07/2023 11:19 AM

## 2023-08-08 DIAGNOSIS — D649 Anemia, unspecified: Secondary | ICD-10-CM | POA: Diagnosis not present

## 2023-08-08 DIAGNOSIS — I10 Essential (primary) hypertension: Secondary | ICD-10-CM | POA: Diagnosis not present

## 2023-08-08 DIAGNOSIS — R7989 Other specified abnormal findings of blood chemistry: Secondary | ICD-10-CM

## 2023-08-08 DIAGNOSIS — S78112A Complete traumatic amputation at level between left hip and knee, initial encounter: Secondary | ICD-10-CM | POA: Diagnosis not present

## 2023-08-08 DIAGNOSIS — E871 Hypo-osmolality and hyponatremia: Secondary | ICD-10-CM

## 2023-08-08 DIAGNOSIS — E1142 Type 2 diabetes mellitus with diabetic polyneuropathy: Secondary | ICD-10-CM | POA: Diagnosis not present

## 2023-08-08 LAB — PROTIME-INR
INR: 2.2 — ABNORMAL HIGH (ref 0.8–1.2)
Prothrombin Time: 24.9 s — ABNORMAL HIGH (ref 11.4–15.2)

## 2023-08-08 LAB — GLUCOSE, CAPILLARY
Glucose-Capillary: 139 mg/dL — ABNORMAL HIGH (ref 70–99)
Glucose-Capillary: 179 mg/dL — ABNORMAL HIGH (ref 70–99)
Glucose-Capillary: 156 mg/dL — ABNORMAL HIGH (ref 70–99)
Glucose-Capillary: 150 mg/dL — ABNORMAL HIGH (ref 70–99)

## 2023-08-08 LAB — CBC
HCT: 25.3 % — ABNORMAL LOW (ref 39.0–52.0)
Hemoglobin: 7.6 g/dL — ABNORMAL LOW (ref 13.0–17.0)
MCH: 22.2 pg — ABNORMAL LOW (ref 26.0–34.0)
MCHC: 30 g/dL (ref 30.0–36.0)
MCV: 74 fL — ABNORMAL LOW (ref 80.0–100.0)
Platelets: 340 10*3/uL (ref 150–400)
RBC: 3.42 MIL/uL — ABNORMAL LOW (ref 4.22–5.81)
RDW: 17.8 % — ABNORMAL HIGH (ref 11.5–15.5)
WBC: 8.1 10*3/uL (ref 4.0–10.5)
nRBC: 0.2 % (ref 0.0–0.2)

## 2023-08-08 LAB — BASIC METABOLIC PANEL
Anion gap: 11 (ref 5–15)
BUN: 17 mg/dL (ref 6–20)
CO2: 24 mmol/L (ref 22–32)
Calcium: 9.1 mg/dL (ref 8.9–10.3)
Chloride: 98 mmol/L (ref 98–111)
Creatinine, Ser: 0.91 mg/dL (ref 0.61–1.24)
GFR, Estimated: >60 mL/min (ref >60)
Glucose, Bld: 166 mg/dL — ABNORMAL HIGH (ref 70–99)
Potassium: 4.7 mmol/L (ref 3.5–5.1)
Sodium: 133 mmol/L — ABNORMAL LOW (ref 135–145)

## 2023-08-08 MED ORDER — METFORMIN HCL ER 500 MG PO TB24
1000.0000 mg | ORAL_TABLET | Freq: Two times a day (BID) | ORAL | Status: DC
  Administered 2023-08-08 – 2023-08-17 (×18): 1000 mg via ORAL
  Filled 2023-08-08 (×19): qty 2

## 2023-08-08 NOTE — Progress Notes (Signed)
PHARMACY - ANTICOAGULATION CONSULT NOTE  Pharmacy Consult for Warfarin Indication: AVR replacement  No Known Allergies  Patient Measurements: Height: 5\' 10"  (177.8 cm) Weight: (!) 156.2 kg (344 lb 5.7 oz) IBW/kg (Calculated) : 73 Heparin Dosing Weight: 113.5  Vital Signs: Temp: 98.3 F (36.8 C) (11/11 0453) Temp Source: Oral (11/11 0453) BP: 116/66 (11/11 0453) Pulse Rate: 78 (11/11 0453)  Labs: Recent Labs    08/05/23 1352 08/06/23 0512 08/07/23 0503 08/08/23 0500  HGB 7.1* 7.4*  --  7.6*  HCT 23.1* 24.8*  --  25.3*  PLT  --  375  --  340  LABPROT  --  22.2* 23.7* 24.9*  INR  --  1.9* 2.1* 2.2*  CREATININE  --  0.80  --  0.91    Estimated Creatinine Clearance: 150.9 mL/min (by C-G formula based on SCr of 0.91 mg/dL).  Assessment: 47 yo male admitted for L BKA stump infection. Patient is on warfarin PTA for history of mechanical AVR.  PTA warfarin regimen: 5mg  MWF, 10mg  all other days.   Warfarin resumed 10/31 after BKA, held x 2 days  INR therapeutic at 2.2 and looks to be stable. Will check INR in AM then MWF.    PTA warfarin regimen: warfarin 5 mg MWF and warfarin10 mg all other days  Goal of Therapy:  INR: 2-3 Anti-Xa: 0.6-1 Monitor platelets by anticoagulation protocol: Yes   Plan:  Warfarin 10 mg qday INR in AM then MWF  [name], PharmD, BCIDP, AAHIVP, CPP Infectious Disease Pharmacist 08/08/2023 8:16 AM

## 2023-08-08 NOTE — Progress Notes (Signed)
Occupational Therapy Session Note  Patient Details  Name: [name] MRN: [id number] Date of Birth: August 09, 1976  Today's Date: 08/08/2023 OT Individual Time: 8756-4332 OT Individual Time Calculation (min): 55 min & 45 mins (10 Missed)   Short Term Goals: Week 2:  OT Short Term Goal 1 (Week 2): STGs=LTGs due to patient's length of stay.  Skilled Therapeutic Interventions/Progress Updates:   Session 1: Pt greeted resting in bed for skilled OT session with focus on resting in bed.   Pain: Pt with un-rated pain at surgical site, pre-medicated. OT offering intermediate rest breaks and positioning suggestions throughout session to address pain/fatigue and maximize participation/safety in session.   Functional Transfers: Pt performs bed mobility with supervision + use of bed features. Pt performs stand-step transfers from EOB<>WC & BSC with CGA-close supervision + HDRW.   Self Care Tasks: Pt with continent void of bowel, requiring A for pericare, managing LB garments with CGA-close supervision + HDRW/alternating unilateral support. Pericare initiated in standing at [facility], task completed at bed-level for increased functional reach. Pt threads and dons LB garments at bed-level with bilateral rolling + use of bed features. Sitting in WC at sink-side, pt completes UB care with Mod I.   Pt remained sitting in WC with 4Ps assessed and immediate needs met. Pt continues to be appropriate for skilled OT intervention to promote further functional independence in ADLs/IADLs.   Session 2: Pt greeted sitting in [facility] for skilled OT session with focus on standing balance/tolerance and dynamic standing balance.   Pain: Pt reported 7/10 residual limb pain, "it's like a null pain" in reference to application of new shinker. OT offering intermediate rest breaks and positioning suggestions throughout session to address pain/fatigue and maximize participation/safety in session.   Functional Transfers: Pt performs  multiple STS transfers with CGA + HDRW. Pt performs stand-pivot from WC>EOB with CGA, using bed-rail for UE support.   Therapeutic Activities: In day room, pt participates in x3 rounds of corn-hole for general strengthening and dynamic standing balance. Pt requires no more than CGA + unilateral support on HDRW.   Pt remained resting in bed with 4Ps assessed and immediate needs met, missing ~10 mins of skilled intervention due to fatigue. Pt continues to be appropriate for skilled OT intervention to promote further functional independence in ADLs/IADLs.   Therapy Documentation Precautions:  Precautions Precautions: Fall Precaution Comments: wound vac L residual limb Restrictions Weight Bearing Restrictions: Yes LLE Weight Bearing: Non weight bearing   Therapy/Group: Individual Therapy  Lou Cal, OTR/L, MSOT  08/08/2023, 6:12 AM

## 2023-08-08 NOTE — Progress Notes (Signed)
PROGRESS NOTE   Subjective/Complaints:  Patient reports he has had some GI discomfort/loose bowel movements.  He reports he has had this in the past with metformin.  He said the symptom was better when he uses metformin XR in the past.  Last BM recorded 11/9  ROS: Patient denies fever, abdominal pain, n/v shortness of breath, chest pain, new motor or sensory changes +phantom pain-continued  Objective:   No results found. Recent Labs    08/06/23 0512 08/08/23 0500  WBC 8.2 8.1  HGB 7.4* 7.6*  HCT 24.8* 25.3*  PLT 375 340   Recent Labs    08/06/23 0512 08/08/23 0500  NA 134* 133*  K 4.8 4.7  CL 97* 98  CO2 26 24  GLUCOSE 164* 166*  BUN 15 17  CREATININE 0.80 0.91  CALCIUM 9.4 9.1    Intake/Output Summary (Last 24 hours) at 08/08/2023 0829 Last data filed at 08/08/2023 0724 Gross per 24 hour  Intake 996 ml  Output 1425 ml  Net -429 ml        Physical Exam: Vital Signs Blood pressure 116/66, pulse 78, temperature 98.3 F (36.8 C), temperature source Oral, resp. rate 18, height 5\' 10"  (1.778 m), weight (!) 156.2 kg, SpO2 97%.   General: No acute distress, laying in bed, HEENT: Head is normocephalic, atraumatic, MMM Heart: Reg rate and rhythm. Mechanical heart valve sound Chest: CTA bilaterally without wheezes, rales, or rhonchi; no distress Abdomen: Soft, non-tender, nondistended, bowel sounds positive Extremities:  L AKA Psych: Cooperative, pleasant Skin: Clean and intact without signs of breakdown, L AKA with sock, incision not assessed today. Venous stasis changes on RLE from mid calf downwards- and 1+ LE edema from R ankle  Neuro:  Mental Status: He is alert and oriented to person, place, and time. Mental status is at baseline.     Comments: Decreased to light touch in RLE from knee downwards - chronic Musculoskeletal:   Cervical back: Neck supple. No tenderness.     Comments: Ues 5/5 in biceps,  triceps, WE, grip and FA B/L RLE- 5-/5 in HF, KE, DF and PF LLE- 4- /5 in HF L AKA -with dry dressing sock in place     Assessment/Plan: 1. Functional deficits which require 3+ hours per day of interdisciplinary therapy in a comprehensive inpatient rehab setting. Physiatrist is providing close team supervision and 24 hour management of active medical problems listed below. Physiatrist and rehab team continue to assess barriers to discharge/monitor patient progress toward functional and medical goals  Care Tool:  Bathing    Body parts bathed by patient: Right arm, Left arm, Chest, Abdomen, Front perineal area, Right upper leg, Face   Body parts bathed by helper: Buttocks, Right lower leg, Left upper leg Body parts n/a: Left lower leg   Bathing assist Assist Level: Moderate Assistance - Patient 50 - 74%     Upper Body Dressing/Undressing Upper body dressing   What is the patient wearing?: Pull over shirt    Upper body assist Assist Level: Set up assist    Lower Body Dressing/Undressing Lower body dressing      What is the patient wearing?:  (Shorts)  Lower body assist Assist for lower body dressing: Maximal Assistance - Patient 25 - 49%     Toileting Toileting Toileting Activity did not occur (Clothing management and hygiene only): N/A (no void or bm)  Toileting assist Assist for toileting: Moderate Assistance - Patient 50 - 74%     Transfers Chair/bed transfer  Transfers assist  Chair/bed transfer activity did not occur: Safety/medical concerns  Chair/bed transfer assist level: Contact Guard/Touching assist     Locomotion Ambulation   Ambulation assist   Ambulation activity did not occur: Safety/medical concerns          Walk 10 feet activity   Assist  Walk 10 feet activity did not occur: Safety/medical concerns        Walk 50 feet activity   Assist Walk 50 feet with 2 turns activity did not occur: Safety/medical concerns          Walk 150 feet activity   Assist Walk 150 feet activity did not occur: Safety/medical concerns         Walk 10 feet on uneven surface  activity   Assist Walk 10 feet on uneven surfaces activity did not occur: Safety/medical concerns         Wheelchair     Assist Is the patient using a wheelchair?: Yes Type of Wheelchair: Manual Wheelchair activity did not occur: Safety/medical concerns         Wheelchair 50 feet with 2 turns activity    Assist    Wheelchair 50 feet with 2 turns activity did not occur: Safety/medical concerns       Wheelchair 150 feet activity     Assist  Wheelchair 150 feet activity did not occur: Safety/medical concerns       Blood pressure 116/66, pulse 78, temperature 98.3 F (36.8 C), temperature source Oral, resp. rate 18, height 5\' 10"  (1.778 m), weight (!) 156.2 kg, SpO2 97%.  Medical Problem List and Plan: 1. 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  Antithrombotics: -DVT/anticoagulation:  Pharmaceutical: Lovenox 150 mg BID until Coumadin/INR therapeutic>> now on coumadin only -11/11 INR therapeutic level             -antiplatelet therapy: n/a 3. Pain Management: chronic pain and neuropathy-  Con't tylenol prn, Oxycodone 5-15 mg q4 hours prn and gabapentin  scheduled- as well as Robaxin prn  -11/4 increase gabapentin to 600 mg 3 times daily from twice daily 11/8 increase gabapentin to 800 mg 3 times daily for phantom pain , mirror therapy  4. Mood/Behavior/Sleep/Anxiety/Depression:              -Continue sertraline 50mg  daily and Gabapentin 600mg  BID             Also team milieu to help with mood and anxiety -antipsychotic  agents: n/a  5. Neuropsych/cognition: This patient is capable of making decisions on his own behalf. 6. Skin/Wound Care: has VAC -for 7 days from 07/27/23  -Continue vitamins -11/5 wound VAC was removed yesterday   7. Fluids/Electrolytes/Nutrition/chronic hyponatremia: current Na 132- will recheck in AM             -  Continue vitamins and supplements  -11/11 sodium overall stable at 133 continue to monitor  8. Diabetes type 2 with polyneuropathy, poorly controlled, Hgb A1C 10.2: SSI, increased semglee to 35U on d/c from acute, metformin to be resumed upon admission to CIR 1000 mg (home dose) BID-AC  -11/5 Increase semglee to 38 u daily as CBGs have been a little elevated -11/6 monitor response to medication change today, CBGs a little better today  -11/11 will change from regular metformin to metformin XR 1000 mg CBG (last 3)  Recent Labs    08/07/23 1708 08/07/23 2042 08/08/23 0550  GLUCAP 151* 153* 139*    9. Acute on chronic anemia: Hgb 7.4 after 1U PRBCs on 11/1, transfuse if <7 -11/4 patient was noted to have some slight bleeding around his backside.  Overall hemoglobin stable at 7.9 today.  -11/5 bleeding appears to be improved, recheck CBC Thursday -11/7 Transfuse 1 U PRBC due to anemia with hemoglobin 6.6.  Discussed with pharmacy, pharmacy will stop Lovenox bridging since INR likely to be therapeutic in the morning. -11/8 recheck with Hgb 7.0 this morning, will recheck again today- 7.1,FOBT pending -11/11 Hgb stable at 7.6 today  10. HTN: continue metoprolol 25mg  BID and lisinopril 20mg  daily  -11/7 BP a little soft at times, decrease lisinopril to 10 mg daily  -11/8 will stop lisinopril for now  -08/06/23 BPs soft but stable, cont regimen for now.   -11/11 BP stable continue current regimen Vitals:   08/04/23 1653 08/04/23 2016 08/05/23 0521 08/05/23 1317  BP: (!) 111/40 (!) 93/52 99/63 107/60   08/05/23 1940 08/06/23 0502 08/06/23 1313 08/06/23 1832  BP: (!) 99/57  115/83 127/69 129/75   08/07/23 0341 08/07/23 1422 08/07/23 1940 08/08/23 0453  BP: 114/64 125/70 107/75 116/66     11. HLD: continue zetia 10mg  daily 12. CAD and mechanical aortic valve: on coumadin resumed on 07/28/23, pharmacy following, bridging with lovenox>> now off lovenox 13. GERD: continue protonix 40mg  daily 14. Morbid obesity: provide dietary education 15. Thrombocytosis: likely reactive, recheck tomorrow  -Improved to 371 on 11/8  -11/11 stable at 340 improved 16. MRSA positive nasal swab: mupirocin BID 17. L BKA stump infection- done with IV ABX since amputation removed infection. .  18. Microscopic hematuria- has nonobstructive renal stones- monitor clinically.  19.  Constipation.  Improved after bowel movements on 11/6 and 11/7  -11/8 will start MiraLAX daily to try to prevent further constipation  -11/11 last BM yesterday, consider additional medication if no BMs by tomorrow 20. Azotemia  -Will stop lisinopril and monitor  -BUN and creatinine stable at 17/0.91  LOS: 8 days A FACE TO FACE EVALUATION WAS PERFORMED  Fanny Dance 08/08/2023, 8:29 AM

## 2023-08-08 NOTE — Progress Notes (Signed)
Physical Therapy Weekly Progress Note  Patient Details  Name: Adam Long MRN: [id number] Date of Birth: December 16, 1975  Beginning of progress report period: August 01, 2023 End of progress report period: August 08, 2023  Today's Date: 08/08/2023 PT Individual Time: 4098-1191 PT Individual Time Calculation (min): 41 min   Patient has met 3 of 3 short term goals.  Pt is completing bed mobility with supervision with use of bed rails and bed features, sit to stand and stand pivot transfer with RW and supervision, WC mobility x 15 feet with B UE and mod I, squat pivot transfer with CGA to R, and min A to L. Pt is ambulating x10 feet with RW and CGA. Anticipating pt to discharge at wheelchiar level with squat pivot transfer and short distance ambulation with RW. D/C planned for 11/20.   Patient continues to demonstrate the following deficits muscle weakness, decreased cardiorespiratoy endurance, and decreased sitting balance, decreased standing balance, and decreased balance strategies and therefore will continue to benefit from skilled PT intervention to increase functional independence with mobility.   Patient progressing toward long term goals..  Continue plan of care.  PT Short Term Goals Week 1:  PT Short Term Goal 1 (Week 1): Pt will perform STS with mod a PT Short Term Goal 1 - Progress (Week 1): Met PT Short Term Goal 2 (Week 1): Pt will perform bed/chair transfer with mod a PT Short Term Goal 2 - Progress (Week 1): Met PT Short Term Goal 3 (Week 1): Pt will propel w/c >100 ft PT Short Term Goal 3 - Progress (Week 1): Met Week 2:  PT Short Term Goal 1 (Week 2): pt will ambulate 10 feet with LRAD PT Short Term Goal 2 (Week 2): Pt will perform squat pivot transfer with min A or less consistently PT Short Term Goal 3 (Week 2): pt will tolerate donning grey shrinker on L LE  Skilled Therapeutic Interventions/Progress Updates:      Pt seated in WC upon arrival. Pt agreeable to  therapy. Pt reports 6/10 L LE residual limb pain. Pt appears to be in better sprits today. Pt very grateful for his bath.   Pt self propelled WC to ortho gym with mod I, pt positioned WC next to mat table and removed leg rest with supervision, verbal cues provided for technique.  Squat pivot transfer to R with light min A, squat pivot transfer to L with heavy mod A, verbal cues provided for head hip ratio, and positioning, pt demos improved buttock clearance.   Pt demos improved carry over of squat pivot transfer to R with CGA WC to hospital bed with use of bed rail   Sit to supine with supervision with bed flat and no use of bedrails.   Therapist followed up with pt regarding ramp installation, pt reports uncle is getting aluminum temporary ramp installed. Therapist followed up with pt regarding opening up doorway between room and kitchen, pt reports plan to open it up prior to discharge on the 20th but has not started process yet.   Pt supine in bed with HOB elevated and all needs within reach.   Treatment Session 2   Pt seated EOB upon arrival. Pt very agitated as pt has not received lunch yet, followed up on lunch order, delivered during session. Pt opted to participate in therapy and each lunch afterwards.   Pt performed stand pivot transfer to L with use of bed rail and WC arm rest with min A, verbal  cues provided for technique, pt demos improved technique with stand pivot versus squat pivot.    Pt self propelled WC 150 feet with B UE and mod I.   Pt performed sit to stand with RW and supervision. Pt ambulated 10 feet with RW and CGA prior to requiring seated rest break 2/2 fatigue. Pt demos LE clearance. Pt dressing fell off during last hop, pt returned to sitting. Pt performed stand pivot transfer with use of grab bar and WC arm rest with CGA, sit to supine with supervision. Nurisng present to change dressing. Therpaist donned white sock and donned grey shrinker with +2 A from nurse.  Pt demos improved tolerance. Pt demos improved tolerance. Pt attempted rolling to L and R (for donning waist strap) without bed rails but unable without A.    Pt performed sit to supine with bed flat with use of bed rails with CGA for safety.   Pt demos improved carry over of stand pivot transfer with bed rail and arm rest of WC with min A.   Pt seated in WC eating lunch with all needs within reach and seatbelt alarm on.    Therapy Documentation Precautions:  Precautions Precautions: Fall Precaution Comments: wound vac L residual limb Restrictions Weight Bearing Restrictions: Yes LLE Weight Bearing: Non weight bearing  Therapy/Group: Individual Therapy  [facility] Ambrose Finland, [place], DPT  08/08/2023, 7:46 AM

## 2023-08-09 LAB — GLUCOSE, CAPILLARY
Glucose-Capillary: 135 mg/dL — ABNORMAL HIGH (ref 70–99)
Glucose-Capillary: 151 mg/dL — ABNORMAL HIGH (ref 70–99)
Glucose-Capillary: 163 mg/dL — ABNORMAL HIGH (ref 70–99)
Glucose-Capillary: 122 mg/dL — ABNORMAL HIGH (ref 70–99)

## 2023-08-09 LAB — PROTIME-INR
INR: 2.5 — ABNORMAL HIGH (ref 0.8–1.2)
Prothrombin Time: 27.1 s — ABNORMAL HIGH (ref 11.4–15.2)

## 2023-08-09 MED ORDER — ALUM & MAG HYDROXIDE-SIMETH 200-200-20 MG/5ML PO SUSP
30.0000 mL | ORAL | Status: DC | PRN
  Administered 2023-08-09: 30 mL via ORAL
  Filled 2023-08-09: qty 30

## 2023-08-09 MED ORDER — ALUM & MAG HYDROXIDE-SIMETH 200-200-20 MG/5ML PO SUSP: 15.0000 mL | ORAL | Status: DC | PRN

## 2023-08-09 MED ORDER — POLYETHYLENE GLYCOL 3350 17 G PO PACK: 17.0000 g | PACK | Freq: Every day | ORAL | Status: DC | PRN

## 2023-08-09 NOTE — Progress Notes (Signed)
Physical Therapy Session Note  Patient Details  Name: Adam Long MRN: [id number] Date of Birth: [birth year]  Today's Date: 08/09/2023 PT Individual Time: 1030-1129, 4098-1191 PT Individual Time Calculation (min): 59 min, 30 min  PT missed time: 45 min (back pain and fatigue)   Short Term Goals: Week 2:  PT Short Term Goal 1 (Week 2): pt will ambulate 10 feet with LRAD PT Short Term Goal 2 (Week 2): Pt will perform squat pivot transfer with min A or less consistently PT Short Term Goal 3 (Week 2): pt will tolerate donning grey shrinker on L LE  Skilled Therapeutic Interventions/Progress Updates:     Treatment Session 1   Pt supine in bed upon arrival. Pt agreeable to therapy. Pt reports 6/10 incision pain, nursing present to change dressing, and administer pain medication.   Discussed pt WC, pt reports he likely wouldn't be able to bring it in, but pt reports it is in good condition.   Pt performed rolling bilaterally with supervision, pt doffing pants with min A over L LE to prevent dressing from being removed, pt donned shrinker with +2 max  A, verbal cues provided for technique. Pt donned pants with combination of bridging and rolling technique with min A and increased time, veral cues provided for technique. Supine to sit with close supervision, with use of bed features. Pt performed stand pivot transfer with use of bed rail and arm rest of WC with CGA/light min A, pt demos improved carry over of technique.   Pt self propelled WC 150 feet with B UE support and mod I prior to needing to return to room for BM.   Pt performed squat pivot transfer WC to BSC in bathroom with use of grab bar with CGA/light min A. Pt continent of bowel. Pt performed sit to stand x4 with RW and CGA for donning/doffing pants, pt requied seated rest break for fatigue. Pt attempted to wipe however unable 2/2 body habitus. Therpaist performed pericare with total A.   Pt performed stand pivot transfer with RW  and supervision.   Pt performed sit to stand x4 with supervision, and performed unilateral shoulder press x10 B, pt initially only able to lift L UE momentarily off of RW then quickly returning for stability , progressed to lifitng L UE for 5 seconds at a time x5.   Pt seated in WC at end of session with all needs within reach.    Treatment Session 2   Pt in L sidelying with head hanging over bed rail upon arrival. Pt reports nausea and 8/10 L back pain-"achy" (located at T12, L flank area upon palpation ), premedicated. Pt reports nausea began after eating lunch, therpaist provided emesis bag, no evidence of vomit. Therapist provided heat pack to area for pain management.  Pt performed 1x10 R knee to chest, and 1x10 L SLR while in supine. Pt reports discomfort in supine position. Pt performed sit to supine with use of bed features and supervision. Pt performed seated row x10 while seated EOB.   Pt reports inability to tolerate therapy this afternoon 2/2 back pain, nausea, and difficulty getting comfortable. Therapist offering to assist pt to transfer to [facility] or recliner for back support. Pt refusing and requesting therapist to return tomorrow.   Pt missed 45 minutes 2/2 not feeling well. Therapist will attempt to make up as able.       Therapy Documentation Precautions:  Precautions Precautions: Fall Precaution Comments: wound vac L residual limb Restrictions Weight  Bearing Restrictions: Yes LLE Weight Bearing: Non weight bearing  Therapy/Group: Individual Therapy  [facility] Ambrose Finland, [place], DPT  08/09/2023, 10:35 AM

## 2023-08-09 NOTE — Progress Notes (Signed)
PROGRESS NOTE   Subjective/Complaints:  No acute events overnight.  Patient reports his pain and residual limb and phantom pain is currently under control.  He was able to tolerate shrinker with some discomfort.  Last BM today-she reports this was a little loose  ROS: Patient denies fever, abdominal pain, n/v shortness of breath, chest pain, new motor or sensory changes +phantom pain-continued + Diarrhea  Objective:   No results found. Recent Labs    08/08/23 0500  WBC 8.1  HGB 7.6*  HCT 25.3*  PLT 340   Recent Labs    08/08/23 0500  NA 133*  K 4.7  CL 98  CO2 24  GLUCOSE 166*  BUN 17  CREATININE 0.91  CALCIUM 9.1    Intake/Output Summary (Last 24 hours) at 08/09/2023 1044 Last data filed at 08/09/2023 0700 Gross per 24 hour  Intake 702 ml  Output 2575 ml  Net -1873 ml        Physical Exam: Vital Signs Blood pressure 118/70, pulse 87, temperature 98 F (36.7 C), resp. rate 18, height 5\' 10"  (1.778 m), weight (!) 156.2 kg, SpO2 98%.   General: No acute distress, working with therapy in his room today HEENT: Head is normocephalic, atraumatic, MMM Heart: Reg rate and rhythm. Mechanical heart valve sound Chest: CTA bilaterally without wheezes, rales, or rhonchi; no distress Abdomen: Soft, non-tender, nondistended, bowel sounds positive Extremities:  L AKA Psych: Cooperative, pleasant Skin: Clean and intact without signs of breakdown, L AKA with sock, incision not assessed today. Venous stasis changes on RLE from mid calf downwards- and 1+ LE edema from R ankle  Neuro:  Mental Status: He is alert and oriented to person, place, and time. Mental status is at baseline.     Comments: Decreased to light touch in RLE from knee downwards - chronic Musculoskeletal:   Cervical back: Neck supple. No tenderness.     Comments: Ues 5/5 in biceps, triceps, WE, grip and FA B/L RLE- 5-/5 in HF, KE, DF and PF LLE-  4- /5 in HF L AKA -with small amount of serosanguineous drainage     Assessment/Plan: 1. Functional deficits which require 3+ hours per day of interdisciplinary therapy in a comprehensive inpatient rehab setting. Physiatrist is providing close team supervision and 24 hour management of active medical problems listed below. Physiatrist and rehab team continue to assess barriers to discharge/monitor patient progress toward functional and medical goals  Care Tool:  Bathing    Body parts bathed by patient: Right arm, Left arm, Chest, Abdomen, Front perineal area, Right upper leg, Face   Body parts bathed by helper: Buttocks, Right lower leg, Left upper leg Body parts n/a: Left lower leg   Bathing assist Assist Level: Moderate Assistance - Patient 50 - 74%     Upper Body Dressing/Undressing Upper body dressing   What is the patient wearing?: Pull over shirt    Upper body assist Assist Level: Set up assist    Lower Body Dressing/Undressing Lower body dressing      What is the patient wearing?:  (Shorts)     Lower body assist Assist for lower body dressing: Maximal Assistance - Patient 25 -  49%     Toileting Toileting Toileting Activity did not occur (Acupuncturist and hygiene only): N/A (no void or bm)  Toileting assist Assist for toileting: Moderate Assistance - Patient 50 - 74%     Transfers Chair/bed transfer  Transfers assist  Chair/bed transfer activity did not occur: Safety/medical concerns  Chair/bed transfer assist level: Minimal Assistance - Patient > 75% (supervision stand pivot with RW, CGA/min A with no AD)     Locomotion Ambulation   Ambulation assist   Ambulation activity did not occur: Safety/medical concerns  Assist level: Contact Guard/Touching assist Assistive device: Walker-rolling Max distance: 10   Walk 10 feet activity   Assist  Walk 10 feet activity did not occur: Safety/medical concerns        Walk 50 feet  activity   Assist Walk 50 feet with 2 turns activity did not occur: Safety/medical concerns         Walk 150 feet activity   Assist Walk 150 feet activity did not occur: Safety/medical concerns         Walk 10 feet on uneven surface  activity   Assist Walk 10 feet on uneven surfaces activity did not occur: Safety/medical concerns         Wheelchair     Assist Is the patient using a wheelchair?: Yes Type of Wheelchair: Manual Wheelchair activity did not occur: Safety/medical concerns  Wheelchair assist level: Independent Max wheelchair distance: 150    Wheelchair 50 feet with 2 turns activity    Assist    Wheelchair 50 feet with 2 turns activity did not occur: Safety/medical concerns   Assist Level: Independent   Wheelchair 150 feet activity     Assist  Wheelchair 150 feet activity did not occur: Safety/medical concerns   Assist Level: Independent   Blood pressure 118/70, pulse 87, temperature 98 F (36.7 C), resp. rate 18, height 5\' 10"  (1.778 m), weight (!) 156.2 kg, SpO2 98%.  Medical Problem List and Plan: 1. Functional deficits secondary to infected L BKA s/p AKA on 07/27/23              -patient may not shower until VAC removed             -ELOS/Goals: 10-12 days- supervision to min A at w/c level  -Continue CIR  -Expected discharge 11/20 -Patient beginning to tolerate shrinker use  2.  Antithrombotics: -DVT/anticoagulation:  Pharmaceutical: Lovenox 150 mg BID until Coumadin/INR therapeutic>> now on coumadin only -11/11 INR therapeutic level             -antiplatelet therapy: n/a 3. Pain Management: chronic pain and neuropathy-  Con't tylenol prn, Oxycodone 5-15 mg q4 hours prn and gabapentin  scheduled- as well as Robaxin prn  -11/4 increase gabapentin to 600 mg 3 times daily from twice daily 11/8 increase gabapentin to 800 mg 3 times daily for phantom pain , mirror therapy 11/11 pain controlled overall, continue current regimen  for now.  Consider decreasing oxycodone dose later this week  4. Mood/Behavior/Sleep/Anxiety/Depression:              -Continue sertraline 50mg  daily and Gabapentin 600mg  BID             Also team milieu to help with mood and anxiety -antipsychotic agents: n/a  5. Neuropsych/cognition: This patient is capable of making decisions on his own behalf. 6. Skin/Wound Care: has VAC -for 7 days from 07/27/23  -Continue vitamins -11/5 wound VAC was removed yesterday   7.  Fluids/Electrolytes/Nutrition/chronic hyponatremia: current Na 132- will recheck in AM             -Continue vitamins and supplements  -11/11 sodium overall stable at 133   Recheck labs Thursday  8. Diabetes type 2 with polyneuropathy, poorly controlled, Hgb A1C 10.2: SSI, increased semglee to 35U on d/c from acute, metformin to be resumed upon admission to CIR 1000 mg (home dose) BID-AC  -11/5 Increase semglee to 38 u daily as CBGs have been a little elevated -11/6 monitor response to medication change today, CBGs a little better today  -11/11 will change from regular metformin to metformin XR 1000 mg  11/12 CBGs well-controlled continue current regimen CBG (last 3)  Recent Labs    08/08/23 1703 08/08/23 2049 08/09/23 0601  GLUCAP 156* 150* 135*    9. Acute on chronic anemia: Hgb 7.4 after 1U PRBCs on 11/1, transfuse if <7 -11/4 patient was noted to have some slight bleeding around his backside.  Overall hemoglobin stable at 7.9 today.  -11/5 bleeding appears to be improved, recheck CBC Thursday -11/7 Transfuse 1 U PRBC due to anemia with hemoglobin 6.6.  Discussed with pharmacy, pharmacy will stop Lovenox bridging since INR likely to be therapeutic in the morning. -11/8 recheck with Hgb 7.0 this morning, will recheck again today- 7.1,FOBT pending -11/11 Hgb stable at 7.6 today Recheck CBC on Thursday  10. HTN: continue metoprolol 25mg  BID and lisinopril 20mg  daily  -11/7 BP a little soft at times, decrease lisinopril  to 10 mg daily  -11/8 will stop lisinopril for now  -08/06/23 BPs soft but stable, cont regimen for now.   -11/12 BP well-controlled continue current regimen Vitals:   08/05/23 1317 08/05/23 1940 08/06/23 0502 08/06/23 1313  BP: 107/60 (!) 99/57 115/83 127/69   08/06/23 1832 08/07/23 0341 08/07/23 1422 08/07/23 1940  BP: 129/75 114/64 125/70 107/75   08/08/23 0453 08/08/23 1438 08/08/23 1925 08/09/23 0437  BP: 116/66 122/73 113/78 118/70     11. HLD: continue zetia 10mg  daily 12. CAD and mechanical aortic valve: on coumadin resumed on 07/28/23, pharmacy following, bridging with lovenox>> now off lovenox 13. GERD: continue protonix 40mg  daily 14. Morbid obesity: provide dietary education 15. Thrombocytosis: likely reactive, recheck tomorrow  -Improved to 371 on 11/8  -11/11 stable at 340 improved 16. MRSA positive nasal swab: mupirocin BID 17. L BKA stump infection- done with IV ABX since amputation removed infection. .  18. Microscopic hematuria- has nonobstructive renal stones- monitor clinically.  19.  Constipation.  Improved after bowel movements on 11/6 and 11/7  -11/8 will start MiraLAX daily to try to prevent further constipation  -11/12 last BM today improved, will change MiraLAX to as needed 20. Azotemia  -Will stop lisinopril and monitor  -BUN and creatinine stable at 17/0.91  LOS: 9 days A FACE TO FACE EVALUATION WAS PERFORMED  Fanny Dance 08/09/2023, 10:44 AM

## 2023-08-09 NOTE — Progress Notes (Signed)
Occupational Therapy Session Note  Patient Details  Name: Adam Long MRN: [id number] Date of Birth: [birth year]  Today's Date: 08/09/2023 OT Individual Time: 4098-1191 OT Individual Time Calculation (min): 44 min    Short Term Goals: Week 2:  OT Short Term Goal 1 (Week 2): STGs=LTGs due to patient's length of stay.  Skilled Therapeutic Interventions/Progress Updates:  Pt greeted resting in bed for skilled OT session with focus on BADL participation.   Pain: Pt reported 6/10 pain at residual limb. OT offering intermediate rest breaks and positioning suggestions throughout session to address pain/fatigue and maximize participation/safety in session.   Functional Transfers: Pt comes to EOB from flat bed with supervision + use of bed rail. Squat pivot transfer attempted from EOB>BSC towards L-side, pt with increased fear of falling, therefore as first attempt transfer deferred to R-side, overall CGA. Pt prefers to transfer back to bed with use of stand-hop transfer, close supervision + HDRW.   Self Care Tasks: At [facility], pt performs lateral leans to doff LB garments with Min A + increased time/effort. Pt continent of BM, returning to supine for most independent pericare. Pt rolls towards L-side, propping RLE onto bed-rail to increase functional reach. Overall setup of washcloths for cleaning. Pt at Mod I level for UB care.    Pt remained resting in bed with 4Ps assessed and immediate needs met. Pt continues to be appropriate for skilled OT intervention to promote further functional independence in ADLs/IADLs.   Therapy Documentation Precautions:  Precautions Precautions: Fall Precaution Comments: wound vac L residual limb Restrictions Weight Bearing Restrictions: Yes LLE Weight Bearing: Non weight bearing   Therapy/Group: Individual Therapy  Lou Cal, OTR/L, MSOT  08/09/2023, 6:13 AM

## 2023-08-09 NOTE — Plan of Care (Signed)
  Problem: RH Bathing Goal: LTG Patient will bathe all body parts with assist levels (OT) Description: LTG: Patient will bathe all body parts with assist levels (OT) Flowsheets (Taken 08/09/2023 1608) LTG: Pt will perform bathing with assistance level/cueing: Independent with assistive device  Note: Upgraded 11/12 due to patient functioning.    Problem: RH Dressing Goal: LTG Patient will perform lower body dressing w/assist (OT) Description: LTG: Patient will perform lower body dressing with assist, with/without cues in positioning using equipment (OT) Flowsheets (Taken 08/09/2023 1608) LTG: Pt will perform lower body dressing with assistance level of: Independent with assistive device Note: Upgraded 11/12 due to patient functioning.    Problem: RH Toileting Goal: LTG Patient will perform toileting task (3/3 steps) with assistance level (OT) Description: LTG: Patient will perform toileting task (3/3 steps) with assistance level (OT)  Flowsheets (Taken 08/09/2023 1608) LTG: Pt will perform toileting task (3/3 steps) with assistance level: Independent with assistive device Note: Upgraded 11/12 due to patient functioning.    Problem: RH Toilet Transfers Goal: LTG Patient will perform toilet transfers w/assist (OT) Description: LTG: Patient will perform toilet transfers with assist, with/without cues using equipment (OT) Flowsheets (Taken 08/09/2023 1609) LTG: Pt will perform toilet transfers with assistance level of: Independent with assistive device Note: Upgraded 11/12 due to patient functioning.    Problem: RH Tub/Shower Transfers Goal: LTG Patient will perform tub/shower transfers w/assist (OT) Description: LTG: Patient will perform tub/shower transfers with assist, with/without cues using equipment (OT) Flowsheets (Taken 08/09/2023 1609) LTG: Pt will perform tub/shower stall transfers with assistance level of: Supervision/Verbal cueing Note: Upgraded 11/12 due to patient  functioning.

## 2023-08-09 NOTE — Progress Notes (Signed)
Occupational Therapy Session Note  Patient Details  Name: Adam Long MRN: [id number] Date of Birth: [birth year]  Today's Date: 08/09/2023 OT Individual Time: 1305-1330 OT Individual Time Calculation (min): 25 min    Short Term Goals: Week 1:  OT Short Term Goal 1 (Week 1): Pt will perform SB transfer with Mod A x1 to access BSC/WC. (Pt progressing away from SB transfer.) OT Short Term Goal 1 - Progress (Week 1): Discontinued (comment) OT Short Term Goal 2 (Week 1): Pt will complete LB dressing with Mod A. OT Short Term Goal 2 - Progress (Week 1): Met OT Short Term Goal 3 (Week 1): Pt will complete 3/3 toileting activities with Mod A. OT Short Term Goal 3 - Progress (Week 1): Met Week 2:  OT Short Term Goal 1 (Week 2): STGs=LTGs due to patient's length of stay. Week 3:     Skilled Therapeutic Interventions/Progress Updates:    1:1 Pt received in the w/c. Discussed and problem solved how to perform hygiene after a BM. Pt performed min guard transfer with RW to the [facility]. Pt unable to reach bottom - trial with use of toileting tongs and able to access with practiced. Pt transferred to the bed from [facility] _ ambulating about ~ 5 feet back to bed. Mod I get back into supine. Pt with c/o back pain and asked for meds for indigestion.   Therapy Documentation Precautions:  Precautions Precautions: Fall Precaution Comments: wound vac L residual limb Restrictions Weight Bearing Restrictions: Yes LLE Weight Bearing: Non weight bearing  Pain: Pain Assessment Pain Scale: 0-10 Pain Score: 7  Pain Type: Acute pain Pain Location: Leg Pain Orientation: Left Pain Intervention(s): Medication (See eMAR)    Therapy/Group: Individual Therapy  Roney Mans [facility] 08/09/2023, 3:36 PM

## 2023-08-09 NOTE — Progress Notes (Signed)
PHARMACY - ANTICOAGULATION CONSULT NOTE  Pharmacy Consult for Warfarin Indication: AVR replacement  No Known Allergies  Patient Measurements: Height: 5\' 10"  (177.8 cm) Weight: (!) 156.2 kg (344 lb 5.7 oz) IBW/kg (Calculated) : 73 Heparin Dosing Weight: 113.5  Vital Signs: Temp: 98.2 F (36.8 C) (11/12 1300) BP: 112/68 (11/12 1300) Pulse Rate: 87 (11/12 1300)  Labs: Recent Labs    08/07/23 0503 08/08/23 0500 08/09/23 0449  HGB  --  7.6*  --   HCT  --  25.3*  --   PLT  --  340  --   LABPROT 23.7* 24.9* 27.1*  INR 2.1* 2.2* 2.5*  CREATININE  --  0.91  --     Estimated Creatinine Clearance: 150.9 mL/min (by C-G formula based on SCr of 0.91 mg/dL).  Assessment: 46 yo male admitted for L BKA stump infection. Patient is on warfarin PTA for history of mechanical AVR.  PTA warfarin regimen: 5mg  MWF, 10mg  all other days.   Warfarin resumed 10/31 after BKA, held x 2 days 11/7 received PRBC x 1  INR therapeutic at 2.5 today. CBC stable. PO intake documented as 100%.   PTA warfarin regimen: warfarin 5 mg MWF and warfarin10 mg all other days  Goal of Therapy:  INR: 2-3 Anti-Xa: 0.6-1 Monitor platelets by anticoagulation protocol: Yes   Plan:  Continue Warfarin 10 mg PO daily INR in MWF Monitor daily INR, CBC, clinical course, s/sx of bleed, PO intake/diet, Drug-Drug Interactions   Thank you for allowing pharmacy to be a part of this patient's care.   Signe Colt, PharmD 08/09/2023 2:23 PM  **Pharmacist phone directory can be found on [email or web address] listed under [facility]**

## 2023-08-09 NOTE — Progress Notes (Signed)
Patient having bloody drainage coming from stump. This nurse has changed dressing and notified charge nurse and Jesusita Oka PA. Applied pressure dressing and continue to monitor output until site can be assessed tomorrow from surgery.   Tori Milks LPN

## 2023-08-09 NOTE — Plan of Care (Signed)
  Problem: RH Car Transfers Goal: LTG Patient will perform car transfers with assist (PT) Description: LTG: Patient will perform car transfers with assistance (PT). Flowsheets (Taken 08/09/2023 0745) LTG: Pt will perform car transfers with assist:: Set up assist

## 2023-08-10 LAB — CBC
HCT: 24.6 % — ABNORMAL LOW (ref 39.0–52.0)
Hemoglobin: 7.1 g/dL — ABNORMAL LOW (ref 13.0–17.0)
MCH: 21.1 pg — ABNORMAL LOW (ref 26.0–34.0)
MCHC: 28.9 g/dL — ABNORMAL LOW (ref 30.0–36.0)
MCV: 73.2 fL — ABNORMAL LOW (ref 80.0–100.0)
Platelets: 320 10*3/uL (ref 150–400)
RBC: 3.36 MIL/uL — ABNORMAL LOW (ref 4.22–5.81)
RDW: 17.6 % — ABNORMAL HIGH (ref 11.5–15.5)
WBC: 8.4 10*3/uL (ref 4.0–10.5)
nRBC: 0 % (ref 0.0–0.2)

## 2023-08-10 LAB — BASIC METABOLIC PANEL
Anion gap: 8 (ref 5–15)
BUN: 16 mg/dL (ref 6–20)
CO2: 26 mmol/L (ref 22–32)
Calcium: 8.9 mg/dL (ref 8.9–10.3)
Chloride: 98 mmol/L (ref 98–111)
Creatinine, Ser: 1.15 mg/dL (ref 0.61–1.24)
GFR, Estimated: >60 mL/min (ref >60)
Glucose, Bld: 111 mg/dL — ABNORMAL HIGH (ref 70–99)
Potassium: 4.1 mmol/L (ref 3.5–5.1)
Sodium: 132 mmol/L — ABNORMAL LOW (ref 135–145)

## 2023-08-10 LAB — GLUCOSE, CAPILLARY
Glucose-Capillary: 106 mg/dL — ABNORMAL HIGH (ref 70–99)
Glucose-Capillary: 115 mg/dL — ABNORMAL HIGH (ref 70–99)
Glucose-Capillary: 141 mg/dL — ABNORMAL HIGH (ref 70–99)
Glucose-Capillary: 155 mg/dL — ABNORMAL HIGH (ref 70–99)

## 2023-08-10 LAB — PROTIME-INR
INR: 2.7 — ABNORMAL HIGH (ref 0.8–1.2)
Prothrombin Time: 28.8 s — ABNORMAL HIGH (ref 11.4–15.2)

## 2023-08-10 NOTE — Progress Notes (Signed)
PHARMACY - ANTICOAGULATION CONSULT NOTE  Pharmacy Consult for Warfarin Indication: AVR replacement  No Known Allergies  Patient Measurements: Height: 5\' 10"  (177.8 cm) Weight: (!) 156.2 kg (344 lb 5.7 oz) IBW/kg (Calculated) : 73 Heparin Dosing Weight: 113.5  Vital Signs: Temp: 97.7 F (36.5 C) (11/13 0403) Temp Source: Oral (11/13 0403) BP: 110/64 (11/13 0403) Pulse Rate: 71 (11/13 0403)  Labs: Recent Labs    08/08/23 0500 08/09/23 0449 08/10/23 0533  HGB 7.6*  --  7.1*  HCT 25.3*  --  24.6*  PLT 340  --  320  LABPROT 24.9* 27.1* 28.8*  INR 2.2* 2.5* 2.7*  CREATININE 0.91  --  1.15    Estimated Creatinine Clearance: 119.4 mL/min (by C-G formula based on SCr of 1.15 mg/dL).  Assessment: 47 yo male admitted for L BKA stump infection. Patient is on warfarin PTA for history of mechanical AVR.  PTA warfarin regimen: 5mg  MWF, 10mg  all other days.   Warfarin resumed 10/31 after BKA, held x 2 days 11/7 received PRBC x 1  INR therapeutic at 2.7 today. CBC stable. PO intake documented as 75-100%.   PTA warfarin regimen: warfarin 5 mg MWF and warfarin10 mg all other days  Goal of Therapy:  INR: 2-3 Anti-Xa: 0.6-1 Monitor platelets by anticoagulation protocol: Yes   Plan:  Continue Warfarin 10 mg PO daily INR tomorrow and then MWF Monitor daily INR, CBC, clinical course, s/sx of bleed, PO intake/diet, Drug-Drug Interactions   Thank you for allowing pharmacy to be a part of this patient's care.   Signe Colt, PharmD 08/10/2023 11:27 AM  **Pharmacist phone directory can be found on [email or web address] listed under [facility]**

## 2023-08-10 NOTE — Patient Care Conference (Signed)
Inpatient RehabilitationTeam Conference and Plan of Care Update Date: 08/10/2023   Time: 12:10 PM    Patient Name: Adam Long      Medical Record Number: [id number]  Date of Birth: 06-03-1976 Sex: Male         Room/Bed: 4W13C/4W13C-01 Payor Info: Payor: [place] MEDICAID PREPAID HE[employer] PLAN / Plan: [place] MEDICAID [facility] / Product Type: *No Product type* /    Admit Date/Time:  07/31/2023  2:25 PM  Primary Diagnosis:  Above knee amputation of left lower extremity [facility])  Hospital Problems: Principal Problem:   Above knee amputation of left lower extremity [facility])    Expected Discharge Date: Expected Discharge Date: 08/17/23  Team Members Present: Physician leading conference: Dr. Fanny Dance Social Worker Present: Dossie Der, LCSW Nurse Present: Chana Bode, RN PT Present: Ambrose Finland, PT OT Present: Lou Cal, OT PPS Coordinator present : Fae Pippin, SLP     Current Status/Progress Goal Weekly Team Focus  Bowel/Bladder   This patient is continent of bladder and bowel   Maintain continence   Assess toileting needs QS/PRN    Swallow/Nutrition/ Hydration               ADL's   Mod I for UB care; setup/supervision-Min A for LB care & pericare at bed-level due to decreased functional reach and body habitus   Goals upgraded to Mod I BADL; Mod I EOB<>BSC transfers; Setup/Supervision TTB transfers & equipment management   Squat pivot transfer for access to tub shower; AE for toileting    Mobility   bed mobility supervision with use of bed features, sit to stand, and stand pivot transfer with RW and sup, squat pivot transfer with CGA to R and min A to L, gait x10 feet with RW and CGA, WC propulsion 150 feet with B UE and mod I.   mod I WC level, supervision for short distance ambulation  barriers to discharge: ramp for home entry-pt plans to install temporary ramp-becky can you follow up regarding this. Pt has WC and RW, pt needs hospital bed. D/C 11/20; pt  will need supervision for equipment management and short distance ambulation x 10 feet with RW, pt needs assist for skin inspection of R LE,  Recommending HHPT    Communication                Safety/Cognition/ Behavioral Observations               Pain   s/p AKA rating surgical pain 7-9/10 on pain scale has Oxycocone prn   pain goal 3/10 on pain scale   Assess pain Q4hrs and prn  with post follow-up    Skin     Incision on left LE with drainage    Incision healing    Monitor skin q shift and amount of drainage; educate patient on dressing changes    Discharge Planning:  Pt progressing well this week and is more motivated-peer person talked with pt Monday. Plan home with uncle will try to get home health for him at [place]   Team Discussion: Patient post AKA left lower extremity limited by GI upset and anemia.  MD modified medication regimen and monitoring anemia.  Patient on target to meet rehab goals: yes, currently needs mod I for upper body care and set up - supervision for lower body care - min assist at the bed level using equipment for toileting. Able to complete sit- stand and stand pivot using a RW with supervision. Completes bed  mobility with supervision using bed features. Completes squat pivot transfer with CGA to R and min A to L, gait x10 feet with RW and CGA, and WC propulsion up to 150 feet with Bilateral UE and mod I assist.  *See Care Plan and progress notes for long and short-term goals.   Revisions to Treatment Plan:  Hospital bed (will not fit in home) trial with queen size bed  Upgraded goals to mod I overall  Teaching Needs: Safety, medications, transfers, toileting, skin care, etc.   Current Barriers to Discharge: Decreased caregiver support, Home enviroment access/layout, and Weight bearing restrictions  Possible Resolutions to Barriers: Family education Ramp for entry to home [facility] follow up services     Medical Summary Current Status: AKA,  hyponatremia, DM2, Anemia, HTN, Constipation, pain  Barriers to Discharge: Medical stability;Weight bearing restrictions;Electrolyte abnormality  Barriers to Discharge Comments: metabolin encephalopathy, AKI on CKD, DM2, CHF, insomnia,rash, hyponatremia Possible Resolutions to [employer] Focus: Moniotor NA, recheck hemoglobin-transfuse if needed, check FOBT, monitor wound drainage   Continued Need for Acute Rehabilitation Level of Care: The patient requires daily medical management by a physician with specialized training in physical medicine and rehabilitation for the following reasons: Direction of a multidisciplinary physical rehabilitation program to maximize functional independence : Yes Medical management of patient stability for increased activity during participation in an intensive rehabilitation regime.: Yes Analysis of laboratory values and/or radiology reports with any subsequent need for medication adjustment and/or medical intervention. : Yes   I attest that I was present, lead the team conference, and concur with the assessment and plan of the team.   Chana Bode B 08/10/2023, 1:15 PM

## 2023-08-10 NOTE — Progress Notes (Signed)
Physical Therapy Session Note  Patient Details  Name: Adam Long MRN: [id number] Date of Birth: 15-Mar-1976  Today's Date: 08/10/2023 PT Individual Time: 1345-1445 PT Individual Time Calculation (min): 60 min   Short Term Goals: Week 2:  PT Short Term Goal 1 (Week 2): pt will ambulate 10 feet with LRAD PT Short Term Goal 2 (Week 2): Pt will perform squat pivot transfer with min A or less consistently PT Short Term Goal 3 (Week 2): pt will tolerate donning grey shrinker on L LE  Skilled Therapeutic Interventions/Progress Updates:      Pt supine in bed upon arrival. Pt agreeable to therapy. Pt reports 5/10 L LE incision pain. Nursing changed dressing, therapist provided rest and repositioning as needed.    Followed up regarding picture of stairs, pt reports he forgot to ask his cousin, but will ask her today.   Followed up with purchasing a ramp for home entry-pt reports he has looked at two but has not gotten any yet. Reiterated ability to purchase or rent from [facility] [employer]. Recommending pt purchase and get installed asap so it is complete by discharge on 11/20. Pt verbalized understanding and agreeable.   Discussed pt home entry set up, pt reports he previously used WC, and RW, and put L knee on step. Pt reports he does not have any handrails. Education provided on importance of installing ramp to reduce fall risk, and reduce risk of future revision or amputation of R LE. Pt verbalized understanding and agreeable.   Therapist noticed significant amount of bleeding of L LE, notified nursing, nursing present to change dressing. Donned L LE shrinker with +2 A mod A to stretch over incision, once over incision pt able to pull up with min A.   Pt performed stand pivot transfer hospital bed to [facility] with R UE on bed rail and L UE on WC arm rest, verbal cues provided for UE posiitoning, and buttock clerance. Pt donned arm rest, and leg rest with supervision, verbal cues provided for technique.    Pt transported dependent in [facility] to ortho gym. Pt performed stand pivot transfer to car with RW and supervision, education provided that pt would need assistance from cousin for equipment management with RW and WC, pt verablized understanding and agreeable.   Pt reports a hospital bed will not fit in his room, pt leaning away from it, pt performed stand pivot transfer with RW and supervision to rehab apartment bed. Pt performed supine to sit and sit to supine with use of bed rail and supervision, pt performed rolling B with mod I and increased time without use of handrail. Education provided on purchasing a bed rail on [place], pt verbalized understanding and agreeable.   Pt performed stand pivot transfer WC to rehab apartment bed with R UE support on WC arm rest, and L UE support on bedrail, and CGA/light min A, verbal cues provided for technique, with emphaisis on UE positioning.   Pt practiced stand pivot transfer with no AD- WC to mat table x2 in each direction with min A, verbal cues provided for positioning WC, removing arm rest and sequencing and technique, pt demos decreased stability without use of UE on bed rail or grab bar.   Pt performed sit to stand with RW and supervision, and clapped with B UE 1x5, with pt rapidly grabbing with RW between each rep, with CGA and supervision.   Pt performed sit to stand throughout session with RW and supervisioin, verbal cues provided for  UE positioning on WC arm rests for controlled descent.     Pt supine in bed with all needs within reach and bed alarm on.           Therapy Documentation Precautions:  Precautions Precautions: Fall Precaution Comments: wound vac L residual limb Restrictions Weight Bearing Restrictions: Yes LLE Weight Bearing: Non weight bearing  Therapy/Group: Individual Therapy  [facility] Ambrose Finland, [place], DPT  08/10/2023, 4:00 PM

## 2023-08-10 NOTE — Progress Notes (Signed)
Patient ID: Adam Long, male   DOB: 05-31-1976, 47 y.o.   MRN: [id number]  Met with pt to discuss team conference progress toward his goals of mod/I level and discharge date still 11/20. He is working on getting a ramp for the two steps to get into the home. Asked to see if cousin can come in and attend therapies with him so can see the assist he needs. He will check with her tonight and tomorrow get back with worker. His tub bench is not one that was paid for by insurance will get him one then. Aware can not find a home health agency to take his insurance so discussed OP he can use his medicaid for transport so will look at resources in [place] for OP.

## 2023-08-10 NOTE — Progress Notes (Signed)
Occupational Therapy Session Note  Patient Details  Name: Adam Long MRN: [id number] Date of Birth: 1975/12/27  Today's Date: 08/10/2023 OT Individual Time: 1000-1110 OT Individual Time Calculation (min): 70 min    Short Term Goals: Week 2:  OT Short Term Goal 1 (Week 2): STGs=LTGs due to patient's length of stay.  Skilled Therapeutic Interventions/Progress Updates:  Pt greeted resting in bed for skilled OT session with focus on functional transfers and general conditioning.   Pain: Pt reported 5/10 pain at residual limb, pre-medicated. OT offering intermediate rest breaks and positioning suggestions throughout session to address pain/fatigue and maximize participation/safety in session.   Functional Transfers: Pt performs all stand-step transfers during session with close supervision-CGA + HDRW.  Stand-pivot from WC>EOB with CGA progressing to supervision.  Self Care Tasks: In ADL apartment space, pt and OT discuss home-bathroom setup. Pt performs TTB transfer with CGA + HDRW. Pt with increased anxiety using DME, due to fear of falling and equipment failure. Pt planning to bathe every other day with supervision/setup provided from cousin. Shower deferred this session due to increased bleeding at residual limb and low HB as directed by MD/PA.    Therapeutic Exercise: In ortho gym, pt participates in 3 x 2 min cycles of Sci[employer] for UE conditioning.    Pt remained resting in bed with 4Ps assessed and immediate needs met. Pt continues to be appropriate for skilled OT intervention to promote further functional independence in ADLs/IADLs.   Therapy Documentation Precautions:  Precautions Precautions: Fall Precaution Comments: wound vac L residual limb Restrictions Weight Bearing Restrictions: Yes LLE Weight Bearing: Non weight bearing   Therapy/Group: Individual Therapy  Lou Cal, OTR/L, MSOT  08/10/2023, 6:06 AM

## 2023-08-10 NOTE — Progress Notes (Addendum)
PROGRESS NOTE   Subjective/Complaints:  Pt reports he had some back pain yesterday, He thinks this was kidney stone pain. Pain resolved later in the day yesterday. He has been having increased serosanguinous drainage from his incision.   Last BM 11/13  ROS: Patient denies fever, chills, abdominal pain, n/v shortness of breath, chest pain, new motor or sensory changes +phantom pain-continued + Diarrhea-improved  Objective:   No results found. Recent Labs    08/08/23 0500 08/10/23 0533  WBC 8.1 8.4  HGB 7.6* 7.1*  HCT 25.3* 24.6*  PLT 340 320   Recent Labs    08/08/23 0500 08/10/23 0533  NA 133* 132*  K 4.7 4.1  CL 98 98  CO2 24 26  GLUCOSE 166* 111*  BUN 17 16  CREATININE 0.91 1.15  CALCIUM 9.1 8.9    Intake/Output Summary (Last 24 hours) at 08/10/2023 0842 Last data filed at 08/10/2023 0710 Gross per 24 hour  Intake 476 ml  Output 2960 ml  Net -2484 ml        Physical Exam: Vital Signs Blood pressure 110/64, pulse 71, temperature 97.7 F (36.5 C), temperature source Oral, resp. rate 18, height 5\' 10"  (1.778 m), weight (!) 156.2 kg, SpO2 99%.   General: No acute distress, lying in bed HEENT: Head is normocephalic, atraumatic, MMM Heart: Reg rate and rhythm. Mechanical heart valve sound Chest: CTA bilaterally without wheezes, rales, or rhonchi; no distress Abdomen: Soft, non-tender, nondistended, bowel sounds positive Extremities:  L AKA Psych: Cooperative, pleasant Skin: Clean and intact without signs of breakdown, L AKA with serosanguinous drainage noted on dressing. Venous stasis changes on RLE from mid calf downwards- and 1+ LE edema from R ankle  Neuro:  Mental Status: He is alert and oriented to person, place, and time. Mental status is at baseline.     Comments: Decreased to light touch in RLE from knee downwards - chronic Musculoskeletal:   Cervical back: Neck supple. No tenderness.      Comments: Ues 5/5 in biceps, triceps, WE, grip and FA B/L RLE- 5-/5 in HF, KE, DF and PF LLE- 4- /5 in HF L AKA - dressing with serosanguineous drainage     Assessment/Plan: 1. Functional deficits which require 3+ hours per day of interdisciplinary therapy in a comprehensive inpatient rehab setting. Physiatrist is providing close team supervision and 24 hour management of active medical problems listed below. Physiatrist and rehab team continue to assess barriers to discharge/monitor patient progress toward functional and medical goals  Care Tool:  Bathing    Body parts bathed by patient: Right arm, Left arm, Chest, Abdomen, Front perineal area, Right upper leg, Face   Body parts bathed by helper: Buttocks, Right lower leg, Left upper leg Body parts n/a: Left lower leg   Bathing assist Assist Level: Moderate Assistance - Patient 50 - 74%     Upper Body Dressing/Undressing Upper body dressing   What is the patient wearing?: Pull over shirt    Upper body assist Assist Level: Set up assist    Lower Body Dressing/Undressing Lower body dressing      What is the patient wearing?:  (Shorts)     Lower  body assist Assist for lower body dressing: Maximal Assistance - Patient 25 - 49%     Toileting Toileting Toileting Activity did not occur (Clothing management and hygiene only): N/A (no void or bm)  Toileting assist Assist for toileting: Moderate Assistance - Patient 50 - 74%     Transfers Chair/bed transfer  Transfers assist  Chair/bed transfer activity did not occur: Safety/medical concerns  Chair/bed transfer assist level: Minimal Assistance - Patient > 75% (supervision stand pivot with RW, CGA/min A with no AD)     Locomotion Ambulation   Ambulation assist   Ambulation activity did not occur: Safety/medical concerns  Assist level: Contact Guard/Touching assist Assistive device: Walker-rolling Max distance: 10   Walk 10 feet activity   Assist  Walk 10  feet activity did not occur: Safety/medical concerns        Walk 50 feet activity   Assist Walk 50 feet with 2 turns activity did not occur: Safety/medical concerns         Walk 150 feet activity   Assist Walk 150 feet activity did not occur: Safety/medical concerns         Walk 10 feet on uneven surface  activity   Assist Walk 10 feet on uneven surfaces activity did not occur: Safety/medical concerns         Wheelchair     Assist Is the patient using a wheelchair?: Yes Type of Wheelchair: Manual Wheelchair activity did not occur: Safety/medical concerns  Wheelchair assist level: Independent Max wheelchair distance: 150    Wheelchair 50 feet with 2 turns activity    Assist    Wheelchair 50 feet with 2 turns activity did not occur: Safety/medical concerns   Assist Level: Independent   Wheelchair 150 feet activity     Assist  Wheelchair 150 feet activity did not occur: Safety/medical concerns   Assist Level: Independent   Blood pressure 110/64, pulse 71, temperature 97.7 F (36.5 C), temperature source Oral, resp. rate 18, height 5\' 10"  (1.778 m), weight (!) 156.2 kg, SpO2 99%.  Medical Problem List and Plan: 1. 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 Formulized individual treatment plan based on medical history, underlying problem and comorbidities.     2.  Antithrombotics: -DVT/anticoagulation:  Pharmaceutical: Lovenox 150 mg BID until Coumadin/INR therapeutic>> now on coumadin only -11/11 INR therapeutic level             -antiplatelet therapy: n/a 3. Pain  Management: chronic pain and neuropathy-  Con't tylenol prn, Oxycodone 5-15 mg q4 hours prn and gabapentin  scheduled- as well as Robaxin prn  -11/4 increase gabapentin to 600 mg 3 times daily from twice daily 11/8 increase gabapentin to 800 mg 3 times daily for phantom pain , mirror therapy 11/11 pain controlled overall, continue current regimen for now.  Consider decreasing oxycodone dose later this week 11/13 pain controlled overall, continue current regimen. Pt reports back pain similar to kidneys stone pain yesterday- this has resolved, continue to monitor   4. Mood/Behavior/Sleep/Anxiety/Depression:              -  Continue sertraline 50mg  daily and Gabapentin 600mg  BID             Also team milieu to help with mood and anxiety -antipsychotic agents: n/a  5. Neuropsych/cognition: This patient is capable of making decisions on his own behalf. 6. Skin/Wound Care: has VAC -for 7 days from 07/27/23  -Continue vitamins -11/5 wound VAC was removed yesterday --Will notify Dr. Lajoyce Corners regarding drainage, continue compression 11/13   7. Fluids/Electrolytes/Nutrition/chronic hyponatremia: current Na 132- will recheck in AM             -Continue vitamins and supplements  -11/11 sodium overall stable at 133   -11/13 Na stable at 132  8. Diabetes type 2 with polyneuropathy, poorly controlled, Hgb A1C 10.2: SSI, increased semglee to 35U on d/c from acute, metformin to be resumed upon admission to CIR 1000 mg (home dose) BID-AC  -11/5 Increase semglee to 38 u daily as CBGs have been a little elevated -11/6 monitor response to medication change today, CBGs a little better today  -11/11 will change from regular metformin to metformin XR 1000 mg  11/13 CBGs well controlled  CBG (last 3)  Recent Labs    08/09/23 1701 08/09/23 2039 08/10/23 0556  GLUCAP 163* 122* 106*    9. Acute on chronic anemia: Hgb 7.4 after 1U PRBCs on 11/1, transfuse if <7 -11/4 patient was noted to have some slight bleeding  around his backside.  Overall hemoglobin stable at 7.9 today.  -11/5 bleeding appears to be improved, recheck CBC Thursday -11/7 Transfuse 1 U PRBC due to anemia with hemoglobin 6.6.  Discussed with pharmacy, pharmacy will stop Lovenox bridging since INR likely to be therapeutic in the morning. -11/8 recheck with Hgb 7.0 this morning, will recheck again today- 7.1,FOBT pending -11/11 Hgb stable at 7.6 today -11/13 HGB 7.1, recheck tomorrow , asked nursing to check FOBT  10. HTN: continue metoprolol 25mg  BID and lisinopril 20mg  daily  -11/7 BP a little soft at times, decrease lisinopril to 10 mg daily  -11/8 will stop lisinopril for now  -08/06/23 BPs soft but stable, cont regimen for now.   -11/13 BP controlled  Vitals:   08/06/23 1313 08/06/23 1832 08/07/23 0341 08/07/23 1422  BP: 127/69 129/75 114/64 125/70   08/07/23 1940 08/08/23 0453 08/08/23 1438 08/08/23 1925  BP: 107/75 116/66 122/73 113/78   08/09/23 0437 08/09/23 1300 08/09/23 1908 08/10/23 0403  BP: 118/70 112/68 134/73 110/64     11. HLD: continue zetia 10mg  daily 12. CAD and mechanical aortic valve: on coumadin resumed on 07/28/23, pharmacy following, bridging with lovenox>> now off lovenox 13. GERD: continue protonix 40mg  daily 14. Morbid obesity: provide dietary education 15. Thrombocytosis: likely reactive, recheck tomorrow  -Improved to 371 on 11/8  -11/13 stable at 320 improved 16. MRSA positive nasal swab: mupirocin BID 17. L BKA stump infection- done with IV ABX since amputation removed infection. .  18. Microscopic hematuria- has nonobstructive renal stones- monitor clinically.  19.  Constipation.  Improved after bowel movements on 11/6 and 11/7  -11/8 will start MiraLAX daily to try to prevent further constipation  -11/12 last BM today improved, will change MiraLAX to as needed 20. Azotemia  -Will stop lisinopril and monitor  -BUN and creatinine stable at 17/0.91  LOS: 10 days A FACE TO FACE EVALUATION WAS  PERFORMED  Fanny Dance 08/10/2023, 8:42 AM

## 2023-08-10 NOTE — Progress Notes (Signed)
Physical Therapy Session Note  Patient Details  Name: Adam Long MRN: [id number] Date of Birth: 10-Feb-1976  Today's Date: 08/10/2023 PT Individual Time: 0802-0914 PT Individual Time Calculation (min): 72 min   Short Term Goals: Week 2:  PT Short Term Goal 1 (Week 2): pt will ambulate 10 feet with LRAD PT Short Term Goal 2 (Week 2): Pt will perform squat pivot transfer with min A or less consistently PT Short Term Goal 3 (Week 2): pt will tolerate donning grey shrinker on L LE  Skilled Therapeutic Interventions/Progress Updates:     Pt received supine in bed and agrees to therapy. Reports kidney stone is no longer bothering him but does report some pain in Lt residual limb. Pt performs supine to sit to Lt side of bed with bed features and increased time required, with cues for positioning and sequencing. Pt completes sit to stand with CGA and RW, with cues for hand placement and initiation, then completes stand step trnafser to [facility] with cues for positioning. Pt self propels WC x100' to dayroom with BUEs. Following brief rest break, PT challenges pt to propel WC by pulling with RLE, to provide endurance and mobility exercise for Rt hamstrings. Pt completes x91' with several brief rest breaks, with cues for posture and body mechanics. Following extended seated rest break, pt completes same x175', but tasked with propelling WC backward to exercise knee extensors. Pt able to completes with fewer rest breaks required. Pt completes additional bout of both backward and forward propulsion with seated rest breaks. Pt then performs 2x10 heel raises in standing with RW for BUE support and cues for body mechanics and correct performance. WC transport back to room. Stand pivot to bed with CGA and no AD, with cues for positioning and safety. Left supine with alarm intact and all needs within reach.   Therapy Documentation Precautions:  Precautions Precautions: Fall Precaution Comments: wound vac L  residual limb Restrictions Weight Bearing Restrictions: Yes LLE Weight Bearing: Non weight bearing   Therapy/Group: Individual Therapy  Beau Fanny, PT, DPT 08/10/2023, 3:17 PM

## 2023-08-11 LAB — CBC
HCT: 25.5 % — ABNORMAL LOW (ref 39.0–52.0)
Hemoglobin: 7.5 g/dL — ABNORMAL LOW (ref 13.0–17.0)
MCH: 21.8 pg — ABNORMAL LOW (ref 26.0–34.0)
MCHC: 29.4 g/dL — ABNORMAL LOW (ref 30.0–36.0)
MCV: 74.1 fL — ABNORMAL LOW (ref 80.0–100.0)
Platelets: 306 10*3/uL (ref 150–400)
RBC: 3.44 MIL/uL — ABNORMAL LOW (ref 4.22–5.81)
RDW: 17.9 % — ABNORMAL HIGH (ref 11.5–15.5)
WBC: 6.8 10*3/uL (ref 4.0–10.5)
nRBC: 0 % (ref 0.0–0.2)

## 2023-08-11 LAB — GLUCOSE, CAPILLARY
Glucose-Capillary: 117 mg/dL — ABNORMAL HIGH (ref 70–99)
Glucose-Capillary: 118 mg/dL — ABNORMAL HIGH (ref 70–99)
Glucose-Capillary: 111 mg/dL — ABNORMAL HIGH (ref 70–99)
Glucose-Capillary: 130 mg/dL — ABNORMAL HIGH (ref 70–99)

## 2023-08-11 LAB — PROTIME-INR
INR: 2.6 — ABNORMAL HIGH (ref 0.8–1.2)
Prothrombin Time: 27.7 s — ABNORMAL HIGH (ref 11.4–15.2)

## 2023-08-11 MED ORDER — OXYCODONE HCL 5 MG PO TABS
10.0000 mg | ORAL_TABLET | ORAL | Status: DC | PRN
  Administered 2023-08-11 – 2023-08-16 (×13): 10 mg via ORAL
  Filled 2023-08-11 (×13): qty 2

## 2023-08-11 MED ORDER — OXYCODONE HCL 5 MG PO TABS: 5.0000 mg | ORAL_TABLET | ORAL | Status: DC | PRN

## 2023-08-11 NOTE — Progress Notes (Signed)
Physical Therapy Session Note  Patient Details  Name: Adam Long MRN: [id number] Date of Birth: Nov 04, 1975  Today's Date: 08/11/2023 PT Individual Time: 0850-0930, 1415-1530 PT Individual Time Calculation (min): 40 min, 75 min   Short Term Goals: Week 2:  PT Short Term Goal 1 (Week 2): pt will ambulate 10 feet with LRAD PT Short Term Goal 2 (Week 2): Pt will perform squat pivot transfer with min A or less consistently PT Short Term Goal 3 (Week 2): pt will tolerate donning grey shrinker on L LE  Skilled Therapeutic Interventions/Progress Updates:      Treatment Session 1 Pt seated EOB upon arrival. Pt agreeable to therpay. Pt reports 5/10 L LE incision pain, premedicated, pt reports RN changed dressing and donned shrinker this AM prior to therapist arrival.   Pt discussed doorway from bedroom to kitchen, pt reports WC fits, but barely and difficult to maneuver. Education on provided on option to remove push rim. Pt very grateful for this education and reports if these come off, he will not need to open the doorway because the push rim is what typically gets stuck.   Pt reports he plans to purchase bed rail today for pt bed.   Followed up on pt stairs at home, pt reports his cousin forgot to take a picture yesterday. Pt showed therapist pictures of ramps he has been looking at. Education provided for ADA compliance of ramp with emphasis on pt needs 1 foot of run for every inch of rise to prevent ramp from being too steep. Pt contacted uncle during session, pt uncle reports not to purchase a temporary ramp, uncle plans to look at it today and build ramp. Therapist provided additional handout provided for how to build a ramp.   Recommending pt utilize WC up and down ramp. Pt practiced 3 different techniques for navigating ramp in WC, ascneding forward with B UE, ascending backwards with B UE, and ascending backwards with B UE and R LE, determined best method for pt is with B UE and R LE,  pt performed with supervision progressing to mod I, verbal cues provided for technique. Pt donned and doffed leg rest with mod I.   Pt performed stand pivot transfer hospital bed to [facility] with supervision with R UE support on bed rail and L UE support on WC arm rest, pt demos improved recall of technique.   Pt self propelled WC 2x150+ feet with mod I.   Pt seated in WC with all needs within reach.   Treatment Session 2    Pt supine in bed upon arrival. Pt agreeable to therapy. Pt denies any pain.   Supine to sit with mod I, stand pivot transfer hospital bed to [facility] with use of bed rail and WC arm rest with supervision, verbal cues provided for UE positioning.   Pt self propelled WC from room to day room with supervision, with one seated rest break 2/2 fatigue.  Pt performed mass practice of squat pivot transfer WC to rehab apartment bed, with pt removing leg rest, positioning WC and using bed rail,pt performing with supervision, verbal cues provided for positioning of WC, head hip ratio, and UE positioning.   Discussed other furniture pt sits on at home. Pt reports he has a lazy boy chair similar to height of recliner but doesn't rock or recline. Pt performed stand pivot transfer WC to recliner, recliner to WC with min-mod A 2/2 low height of recliner, verbal cues provided for UE positioning on  WC. Education provided to raise height of chair with blankets, pt verbalized understanding and agreeable.   Pt performed the following therex for B LE strengthening  1x10 SLR B  1x10 hip supine hip abduction B  1x10 single leg bridge  1x10 R sidelying L hip extension  1x10 R sidelying L hip abduction  1x10 prone L hip extension  While pt in prone, noticed circular red wound on R posterior calf, with hemosiderin staining and skin tear. Notified nursing.   Pt self propelled WC to day room, and positioned WC next to table to fill out what he is thankful for on leaf to hang on department tree. Pt in very  good mood overall, and grateful for participating in this task. Pt wrote, "family, my therapist and being alive", pt reached above ahead with B UE to tape leaf to tree on wall, verbal cues provided for locking brakes for safety prior to reaching.   Pt participated in upper body and lower body exercise in day room with one on one assist from therapist in social setting to increase mood and quality of life. Pt greatly appreciative of the music and peers.   Pt supine in bed at end of session with needs in reach and bed alarm on.              Therapy Documentation Precautions:  Precautions Precautions: Fall Precaution Comments: wound vac L residual limb Restrictions Weight Bearing Restrictions: Yes LLE Weight Bearing: Non weight bearing  Therapy/Group: Individual Therapy  [facility] Ambrose Finland, [place], DPT  08/11/2023, 7:33 AM

## 2023-08-11 NOTE — Progress Notes (Addendum)
Occupational Therapy Session Note  Patient Details  Name: Adam Long MRN: [id number] Date of Birth: 05/30/1976  Today's Date: 08/11/2023 OT Individual Time: 1135-1200 OT Individual Time Calculation (min): 25 min    Short Term Goals: Week 2:  OT Short Term Goal 1 (Week 2): STGs=LTGs due to patient's length of stay.  Skilled Therapeutic Interventions/Progress Updates:  Skilled OT intervention completed with focus on limb care/shrinker wear education, functional transfers and BUE exercises. Pt received seated in w/c, agreeable to session. 7/10 pain reported in Lt residual limb; pre-medicated. OT offered rest breaks and repositioning throughout for pain reduction.  Per secure chat, pt's limb with excessive drainage therefore wound checked at start of session. Grey shrinker noted to be rolled 75% of the way down his stump with pt unaware. Pt agreeable to return to bed to obtain supine position for ease with shrinker donning/doffing. Completed supervision squat pivot with use of bed rail to EOB. Mod I transition to supine and removal of shorts.  Upon clothing/shrinker removal, Lt stump noted to have red ring around mid to upper thigh from tourniquet effect. No active drainage noted on bandages already in place. Nurse notified of status of skin. Extensive education provided to pt on proper wear, placement of AKA shrinker up to his hip and had pt use long handled mirror for visual feedback with self-inspection to compare improper to proper wear. With pt stabilizing shrinker on top while OT scooped underneath to reduce pain, then had pt pull up rest of shrinker to also minimize pain with the process. Required overall mod A to donn, including the waist strap. Pt verbalized that his limb felt more comfortable this way. Re-donned LB clothing with mod I.  Semi upright in bed, pt completed the following BUE exercises to promote BUE strength/endurance needed for independence with functional transfers and  BADLs: (With 4 lb dowel) -2x15 chest press -15 shoulder flexion -2x15 bicep curls -15 horizontal shoulder abduction  Pt remained semi upright in bed, with bed alarm on/activated, and with all needs in reach at end of session.   Therapy Documentation Precautions:  Precautions Precautions: Fall Restrictions Weight Bearing Restrictions: Yes LLE Weight Bearing: Non weight bearing    Therapy/Group: Individual Therapy  Melvyn Novas, MS, OTR/L  08/11/2023, 12:21 PM

## 2023-08-11 NOTE — Progress Notes (Addendum)
PROGRESS NOTE   Subjective/Complaints:  No new concerns this AM. Reports pain is controlled. He thinks he passed a small kidney stone yesterday.   Last BM 11/13  ROS: Patient denies fever, chills, abdominal pain, n/v shortness of breath, chest pain, new motor or sensory changes +phantom pain-improved + Diarrhea-improved  Objective:   No results found. Recent Labs    08/10/23 0533 08/11/23 0627  WBC 8.4 6.8  HGB 7.1* 7.5*  HCT 24.6* 25.5*  PLT 320 306   Recent Labs    08/10/23 0533  NA 132*  K 4.1  CL 98  CO2 26  GLUCOSE 111*  BUN 16  CREATININE 1.15  CALCIUM 8.9    Intake/Output Summary (Last 24 hours) at 08/11/2023 1300 Last data filed at 08/11/2023 1250 Gross per 24 hour  Intake 714 ml  Output 2250 ml  Net -1536 ml        Physical Exam: Vital Signs Blood pressure 138/64, pulse 79, temperature 97.8 F (36.6 C), resp. rate 18, height 5\' 10"  (1.778 m), weight (!) 156.2 kg, SpO2 100%.   General: No acute distress, lying in bed HEENT: Head is normocephalic, atraumatic, MMM Heart: Reg rate and rhythm. Mechanical heart valve sound Chest: CTA bilaterally without wheezes, rales, or rhonchi; no distress Abdomen: Soft, non-tender, nondistended, bowel sounds positive Extremities:  L AKA Psych: Cooperative, pleasant Skin: Clean and intact without signs of breakdown, dry dressing in place.  Venous stasis changes on RLE from mid calf downwards- and 1+ LE edema from R ankle  Neuro:  Mental Status: He is alert and oriented to person, place, and time. Mental status is at baseline.     Comments: Decreased to light touch in RLE from knee downwards - chronic Musculoskeletal:   Cervical back: Neck supple. No tenderness.     Comments: Ues 5/5 in biceps, triceps, WE, grip and FA B/L RLE- 5-/5 in HF, KE, DF and PF LLE- 4- /5 in HF L AKA -dry dressing in place wearing shrinker   Assessment/Plan: 1. Functional  deficits which require 3+ hours per day of interdisciplinary therapy in a comprehensive inpatient rehab setting. Physiatrist is providing close team supervision and 24 hour management of active medical problems listed below. Physiatrist and rehab team continue to assess barriers to discharge/monitor patient progress toward functional and medical goals  Care Tool:  Bathing    Body parts bathed by patient: Right arm, Left arm, Chest, Abdomen, Front perineal area, Right upper leg, Face   Body parts bathed by helper: Buttocks, Right lower leg, Left upper leg Body parts n/a: Left lower leg   Bathing assist Assist Level: Moderate Assistance - Patient 50 - 74%     Upper Body Dressing/Undressing Upper body dressing   What is the patient wearing?: Pull over shirt    Upper body assist Assist Level: Set up assist    Lower Body Dressing/Undressing Lower body dressing      What is the patient wearing?:  (Shorts)     Lower body assist Assist for lower body dressing: Maximal Assistance - Patient 25 - 49%     Toileting Toileting Toileting Activity did not occur (Clothing management and hygiene only): N/A (  no void or bm)  Toileting assist Assist for toileting: Moderate Assistance - Patient 50 - 74%     Transfers Chair/bed transfer  Transfers assist  Chair/bed transfer activity did not occur: Safety/medical concerns  Chair/bed transfer assist level: Minimal Assistance - Patient > 75% (supervision stand pivot with RW, CGA/min A with no AD)     Locomotion Ambulation   Ambulation assist   Ambulation activity did not occur: Safety/medical concerns  Assist level: Contact Guard/Touching assist Assistive device: Walker-rolling Max distance: 10   Walk 10 feet activity   Assist  Walk 10 feet activity did not occur: Safety/medical concerns        Walk 50 feet activity   Assist Walk 50 feet with 2 turns activity did not occur: Safety/medical concerns         Walk 150  feet activity   Assist Walk 150 feet activity did not occur: Safety/medical concerns         Walk 10 feet on uneven surface  activity   Assist Walk 10 feet on uneven surfaces activity did not occur: Safety/medical concerns         Wheelchair     Assist Is the patient using a wheelchair?: Yes Type of Wheelchair: Manual Wheelchair activity did not occur: Safety/medical concerns  Wheelchair assist level: Independent Max wheelchair distance: 150    Wheelchair 50 feet with 2 turns activity    Assist    Wheelchair 50 feet with 2 turns activity did not occur: Safety/medical concerns   Assist Level: Independent   Wheelchair 150 feet activity     Assist  Wheelchair 150 feet activity did not occur: Safety/medical concerns   Assist Level: Independent   Blood pressure 138/64, pulse 79, temperature 97.8 F (36.6 C), resp. rate 18, height 5\' 10"  (1.778 m), weight (!) 156.2 kg, SpO2 100%.  Medical Problem List and Plan: 1. 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  Antithrombotics: -DVT/anticoagulation:  Pharmaceutical: Lovenox 150 mg BID until Coumadin/INR therapeutic>> now on coumadin only -11/11 INR therapeutic level             -antiplatelet therapy: n/a 3. Pain Management: chronic pain and neuropathy-  Con't tylenol prn, Oxycodone 5-15 mg q4 hours prn and gabapentin  scheduled- as well as Robaxin prn  -11/4 increase gabapentin to 600 mg 3 times daily from twice daily 11/8 increase gabapentin to 800 mg 3 times daily for phantom pain , mirror therapy 11/11 pain controlled overall, continue current regimen for now.  Consider decreasing oxycodone dose later this week 11/13 pain controlled overall, continue current  regimen. Pt reports back pain similar to kidneys stone pain yesterday- this has resolved, continue to monitor   11/14 oxycodone dose adjusted to 5 to 10 mg  4. Mood/Behavior/Sleep/Anxiety/Depression:              -Continue sertraline 50mg  daily and Gabapentin 600mg  BID             Also team milieu to help with mood and anxiety -antipsychotic agents: n/a  5. Neuropsych/cognition: This patient is capable of making decisions on his own behalf. 6. Skin/Wound Care: has VAC -  for 7 days from 07/27/23  -Continue vitamins -11/5 wound VAC was removed yesterday -- 11/14 discussed wound and drainage with Dr. Lajoyce Corners yesterday, Dr. Huel Cote was able to look at the wound image.  Dr. Audrie Lia drainage expected due to increased volume adipose tissue   7. Fluids/Electrolytes/Nutrition/chronic hyponatremia: current Na 132- will recheck in AM             -Continue vitamins and supplements  -11/11 sodium overall stable at 133   -11/13 Na stable at 132  8. Diabetes type 2 with polyneuropathy, poorly controlled, Hgb A1C 10.2: SSI, increased semglee to 35U on d/c from acute, metformin to be resumed upon admission to CIR 1000 mg (home dose) BID-AC  -11/5 Increase semglee to 38 u daily as CBGs have been a little elevated -11/6 monitor response to medication change today, CBGs a little better today  -11/11 will change from regular metformin to metformin XR 1000 mg  11/14 CBGs controlled continue current regimen CBG (last 3)  Recent Labs    08/10/23 2054 08/11/23 0555 08/11/23 1117  GLUCAP 155* 117* 118*    9. Acute on chronic anemia: Hgb 7.4 after 1U PRBCs on 11/1, transfuse if <7 -11/4 patient was noted to have some slight bleeding around his backside.  Overall hemoglobin stable at 7.9 today.  -11/5 bleeding appears to be improved, recheck CBC Thursday -11/7 Transfuse 1 U PRBC due to anemia with hemoglobin 6.6.  Discussed with pharmacy, pharmacy will stop Lovenox bridging since INR likely to be therapeutic in  the morning. -11/8 recheck with Hgb 7.0 this morning, will recheck again today- 7.1,FOBT pending -11/11 Hgb stable at 7.6 today -11/13 HGB 7.1, recheck tomorrow , asked nursing to check FOBT  -11/15 hemoglobin stable at 7.5 today 10. HTN: continue metoprolol 25mg  BID and lisinopril 20mg  daily  -11/7 BP a little soft at times, decrease lisinopril to 10 mg daily  -11/8 will stop lisinopril for now  -08/06/23 BPs soft but stable, cont regimen for now.   -11/13 BP controlled  Vitals:   08/07/23 1940 08/08/23 0453 08/08/23 1438 08/08/23 1925  BP: 107/75 116/66 122/73 113/78   08/09/23 0437 08/09/23 1300 08/09/23 1908 08/10/23 0403  BP: 118/70 112/68 134/73 110/64   08/10/23 1450 08/10/23 1920 08/10/23 2007 08/11/23 0505  BP: (!) 149/54 122/77 122/77 138/64     11. HLD: continue zetia 10mg  daily 12. CAD and mechanical aortic valve: on coumadin resumed on 07/28/23, pharmacy following, bridging with lovenox>> now off lovenox 13. GERD: continue protonix 40mg  daily 14. Morbid obesity: provide dietary education 15. Thrombocytosis: likely reactive, recheck tomorrow  -Improved to 371 on 11/8  -11/14 stable at 306 16. MRSA positive nasal swab: mupirocin BID 17. L BKA stump infection- done with IV ABX since amputation removed infection. .  18. Microscopic hematuria- has nonobstructive renal stones- monitor clinically.  19.  Constipation.  Improved after bowel movements on 11/6 and 11/7  -11/8 will start MiraLAX daily to try to prevent further constipation  -11/14 last BM 11/12, consider additional medication if no BM today 20. Azotemia  -Will stop lisinopril and monitor  -BUN and creatinine stable at 17/0.91  LOS: 11 days A FACE TO FACE EVALUATION WAS PERFORMED  Fanny Dance 08/11/2023, 1:00 PM

## 2023-08-11 NOTE — Progress Notes (Signed)
Occupational Therapy Session Note  Patient Details  Name: Adam Long MRN: [id number] Date of Birth: 02-Nov-1975  Today's Date: 08/11/2023 OT Individual Time: 1000-1100 OT Individual Time Calculation (min): 60 min    Short Term Goals: Week 2:  OT Short Term Goal 1 (Week 2): STGs=LTGs due to patient's length of stay.  Skilled Therapeutic Interventions/Progress Updates:   Pt eager for OT to either perform shower or full sponge bath and self care routine. OT chart reviewed MD note which continues to states that no shower until wound vac discontinued. Despite wound vac d/c, no statement for clear for shower with residual limb incision outlined. MD notes c/s for drainage to Dr Lajoyce Corners and Kriste Basque nurse reports there was excessive drainage and bleeding this am and dressing was replaced. OT felt clinically not to shower and perform sponge bath and nurse and pt agree. OT also requested to team for L grey shrinker replacement to be ordered. Pt did an excellent job this session sinkside with RW for intermittent standing for LE bathing and dressing with only assist needed was for CGA for steadiness for balance and buttocks and drying between R toes, and R sock. Indep R tennis shoe, underwear and shorts with reacher. Pt left w/c level with all safety measures, needs and nurse call button in reach.  Pain: "low" on my L limb now that the shrinker is on- pt reported  Therapy Documentation Precautions:  Precautions Precautions: Fall Precaution Comments: wound vac L residual limb Restrictions Weight Bearing Restrictions: Yes LLE Weight Bearing: Non weight bearing   Therapy/Group: Individual Therapy  Vicenta Dunning 08/11/2023, 7:57 AM

## 2023-08-11 NOTE — Progress Notes (Signed)
Patient ID: Adam Long, male   DOB: [birth year], 47 y.o.   MRN: [id number]  Pt's cousin can come in Sunday at 1;00-3:00 for education. Have placed on calendar for scheduler.

## 2023-08-11 NOTE — Progress Notes (Signed)
PHARMACY - ANTICOAGULATION CONSULT NOTE  Pharmacy Consult for Warfarin Indication: AVR replacement  No Known Allergies  Patient Measurements: Height: 5\' 10"  (177.8 cm) Weight: (!) 156.2 kg (344 lb 5.7 oz) IBW/kg (Calculated) : 73 Heparin Dosing Weight: 113.5  Vital Signs: Temp: 97.8 F (36.6 C) (11/14 0505) BP: 138/64 (11/14 0505) Pulse Rate: 79 (11/14 0505)  Labs: Recent Labs    08/09/23 0449 08/10/23 0533 08/11/23 0627  HGB  --  7.1* 7.5*  HCT  --  24.6* 25.5*  PLT  --  320 306  LABPROT 27.1* 28.8* 27.7*  INR 2.5* 2.7* 2.6*  CREATININE  --  1.15  --     Estimated Creatinine Clearance: 119.4 mL/min (by C-G formula based on SCr of 1.15 mg/dL).  Assessment: 47 yo male admitted for L BKA stump infection. Patient is on warfarin PTA for history of mechanical AVR.  PTA warfarin regimen: 5mg  MWF, 10mg  all other days.   Warfarin resumed 10/31 after BKA, held x 2 days 11/7 received PRBC x 1  INR remains therapeutic at 2.6 today. CBC stable. PO intake documented as 65-100%.   PTA warfarin regimen: warfarin 5 mg MWF and warfarin10 mg all other days  Goal of Therapy:  INR: 2-3 Anti-Xa: 0.6-1 Monitor platelets by anticoagulation protocol: Yes   Plan:  Continue Warfarin 10 mg PO daily INR MWF Monitor daily INR, CBC, clinical course, s/sx of bleed, PO intake/diet, Drug-Drug Interactions   Thank you for allowing pharmacy to be a part of this patient's care.   Signe Colt, PharmD 08/11/2023 12:21 PM  **Pharmacist phone directory can be found on [email or web address] listed under [facility]**

## 2023-08-11 NOTE — Plan of Care (Signed)
  Problem: Consults Goal: RH GENERAL PATIENT EDUCATION Description: See Patient Education module for education specifics. Outcome: Progressing Goal: Diabetes Guidelines if Diabetic/Glucose > 140 Description: If diabetic or lab glucose is > 140 mg/dl - Initiate Diabetes/Hyperglycemia Guidelines & Document Interventions  Outcome: Progressing   Problem: RH BOWEL ELIMINATION Goal: RH STG MANAGE BOWEL WITH ASSISTANCE Description: STG Manage Bowel with mod I Assistance. Outcome: Progressing   Problem: RH SKIN INTEGRITY Goal: RH STG ABLE TO PERFORM INCISION/WOUND CARE W/ASSISTANCE Description: STG Able To Perform Incision/Wound Care With mod I Assistance. Outcome: Progressing   Problem: RH PAIN MANAGEMENT Goal: RH STG PAIN MANAGED AT OR BELOW PT'S PAIN GOAL Description: Pain level below 5/10 Outcome: Progressing   Problem: RH KNOWLEDGE DEFICIT GENERAL Goal: RH STG INCREASE KNOWLEDGE OF SELF CARE AFTER HOSPITALIZATION Description: Pt will be able to demonstrate understanding of medication management, dietary and lifestyle modifications to prevent secondary complications with mod I assist using booklet and handouts provided. Outcome: Progressing   Problem: Consults Goal: RH LIMB LOSS PATIENT EDUCATION Description: Description: See Patient Education module for eduction specifics. Outcome: Progressing   Problem: RH KNOWLEDGE DEFICIT LIMB LOSS Goal: RH STG INCREASE KNOWLEDGE OF SELF CARE AFTER LIMB LOSS Description: Pt will be able to demonstrate understanding of medication management, dietary and lifestyle modifications to prevent secondary complications with mod I assist using booklet and handouts provided. Outcome: Progressing

## 2023-08-12 ENCOUNTER — Inpatient Hospital Stay (HOSPITAL_COMMUNITY): Payer: Medicaid Other

## 2023-08-12 DIAGNOSIS — N200 Calculus of kidney: Secondary | ICD-10-CM

## 2023-08-12 LAB — URINALYSIS, ROUTINE W REFLEX MICROSCOPIC
Bilirubin Urine: NEGATIVE
Glucose, UA: NEGATIVE mg/dL
Ketones, ur: NEGATIVE mg/dL
Nitrite: NEGATIVE
Protein, ur: 30 mg/dL — AB
Specific Gravity, Urine: 1.005 (ref 1.005–1.030)
pH: 5 (ref 5.0–8.0)

## 2023-08-12 LAB — GLUCOSE, CAPILLARY
Glucose-Capillary: 142 mg/dL — ABNORMAL HIGH (ref 70–99)
Glucose-Capillary: 115 mg/dL — ABNORMAL HIGH (ref 70–99)
Glucose-Capillary: 102 mg/dL — ABNORMAL HIGH (ref 70–99)
Glucose-Capillary: 123 mg/dL — ABNORMAL HIGH (ref 70–99)

## 2023-08-12 LAB — PROTIME-INR
INR: 2.3 — ABNORMAL HIGH (ref 0.8–1.2)
Prothrombin Time: 25.7 s — ABNORMAL HIGH (ref 11.4–15.2)

## 2023-08-12 MED ORDER — SENNOSIDES-DOCUSATE SODIUM 8.6-50 MG PO TABS
2.0000 | ORAL_TABLET | Freq: Every day | ORAL | Status: DC
  Administered 2023-08-12 – 2023-08-16 (×3): 2 via ORAL
  Filled 2023-08-12 (×4): qty 2

## 2023-08-12 MED ORDER — POLYETHYLENE GLYCOL 3350 17 G PO PACK
17.0000 g | PACK | Freq: Every day | ORAL | Status: DC
  Administered 2023-08-12 – 2023-08-16 (×2): 17 g via ORAL
  Filled 2023-08-12 (×6): qty 1

## 2023-08-12 NOTE — Progress Notes (Signed)
Patient ID: Adam Long, male   DOB: 05-26-1976, 47 y.o.   MRN: [id number]  met with [pt to let him know he owes money to [employer] and will need to pay this before they would bring his tub bench. Gave him the number to call to find out and pay if able to

## 2023-08-12 NOTE — Progress Notes (Signed)
PHARMACY - ANTICOAGULATION CONSULT NOTE  Pharmacy Consult for warfarin Indication:  hx mechanical AVR  No Known Allergies  Patient Measurements: Height: 5\' 10"  (177.8 cm) Weight: (!) 156.2 kg (344 lb 5.7 oz) IBW/kg (Calculated) : 73  Vital Signs: Temp: 98.3 F (36.8 C) (11/15 0553) BP: 136/75 (11/15 0553) Pulse Rate: 71 (11/15 0553)  Labs: Recent Labs    08/10/23 0533 08/11/23 0627 08/12/23 0542  HGB 7.1* 7.5*  --   HCT 24.6* 25.5*  --   PLT 320 306  --   LABPROT 28.8* 27.7* 25.7*  INR 2.7* 2.6* 2.3*  CREATININE 1.15  --   --     Estimated Creatinine Clearance: 119.4 mL/min (by C-G formula based on SCr of 1.15 mg/dL).  Assessment: 47 yo male admitted for L BKA stump infection. Patient is on warfarin PTA for history of mechanical AVR. Warfarin held x 2 days for BKA, resumed 10/31.  INR remains therapeutic at 2.3.  Some trend down.  Hemoglobin low stable, PRBCs given on 11/17.  Has been on warfarin 10 mg daily since 11/10.  PTA warfarin regimen: 5 mg MWF, 10 mg TTSS.   Goal of Therapy:  INR 2-3 Monitor platelets by anticoagulation protocol: Yes   Plan:  Continue warfarin 10 mg daily. PT/INR in am for trend. Has standing order for PT/INR MWF Next CBC due on 11/18.  Dennie Fetters, RPh 08/12/2023,1:04 PM

## 2023-08-12 NOTE — Progress Notes (Signed)
Recreational Therapy Session Note  Patient Details  Name: ERICK AYE MRN: [id number] Date of Birth: 12-21-1975 Today's Date: 08/12/2023  Pain:  no c/o  Pt participated in animal assisted activity seated w/c level with supervision.  Pt appreciative of this visit and talking about his personal dog.   Kumiko Fishman 08/12/2023, 12:46 PM

## 2023-08-12 NOTE — Progress Notes (Addendum)
PROGRESS NOTE   Subjective/Complaints:  Patient reports he has had some pain in his left middle back, similar to pain he has had with kidney stones in the past.  Pain is a improved this morning.  Last BM 11/13  ROS: Patient denies fever, chills, abdominal pain, n/v shortness of breath, chest pain, new motor or sensory changes +phantom pain-improved + Diarrhea-improved  Objective:   No results found. Recent Labs    08/10/23 0533 08/11/23 0627  WBC 8.4 6.8  HGB 7.1* 7.5*  HCT 24.6* 25.5*  PLT 320 306   Recent Labs    08/10/23 0533  NA 132*  K 4.1  CL 98  CO2 26  GLUCOSE 111*  BUN 16  CREATININE 1.15  CALCIUM 8.9    Intake/Output Summary (Last 24 hours) at 08/12/2023 0841 Last data filed at 08/11/2023 2000 Gross per 24 hour  Intake 480 ml  Output 650 ml  Net -170 ml        Physical Exam: Vital Signs Blood pressure 136/75, pulse 71, temperature 98.3 F (36.8 C), resp. rate 16, height 5\' 10"  (1.778 m), weight (!) 156.2 kg, SpO2 100%.   General: No acute distress, lying in bed HEENT: Head is normocephalic, atraumatic, MMM Heart: Reg rate and rhythm. Mechanical heart valve sound Chest: CTA bilaterally without wheezes, rales, or rhonchi; no distress Abdomen: Soft, non-tender, nondistended, bowel sounds positive Extremities:  L AKA Psych: Cooperative, pleasant Skin: Clean and intact without signs of breakdown, dry dressing in place.  Venous stasis changes on RLE from mid calf downwards- and 1+ LE edema from R ankle  Neuro:  Mental Status: He is alert and oriented to person, place, and time. Mental status is at baseline.     Comments: Decreased to light touch in RLE from knee downwards - chronic Musculoskeletal:   Cervical back: Neck supple.  No flank, lower back, or thoracic spine area TTP noted today.    Comments: Ues 5/5 in biceps, triceps, WE, grip and FA B/L RLE- 5-/5 in HF, KE, DF and PF LLE- 4-  /5 in HF L AKA -dry dressing in place wearing shrinker   Assessment/Plan: 1. Functional deficits which require 3+ hours per day of interdisciplinary therapy in a comprehensive inpatient rehab setting. Physiatrist is providing close team supervision and 24 hour management of active medical problems listed below. Physiatrist and rehab team continue to assess barriers to discharge/monitor patient progress toward functional and medical goals  Care Tool:  Bathing    Body parts bathed by patient: Right arm, Left arm, Chest, Abdomen, Front perineal area, Right upper leg, Face   Body parts bathed by helper: Buttocks, Right lower leg, Left upper leg Body parts n/a: Left lower leg   Bathing assist Assist Level: Moderate Assistance - Patient 50 - 74%     Upper Body Dressing/Undressing Upper body dressing   What is the patient wearing?: Pull over shirt    Upper body assist Assist Level: Set up assist    Lower Body Dressing/Undressing Lower body dressing      What is the patient wearing?:  (Shorts)     Lower body assist Assist for lower body dressing: Maximal Assistance -  Patient 25 - 49%     Toileting Toileting Toileting Activity did not occur Press photographer and hygiene only): N/A (no void or bm)  Toileting assist Assist for toileting: Moderate Assistance - Patient 50 - 74%     Transfers Chair/bed transfer  Transfers assist  Chair/bed transfer activity did not occur: Safety/medical concerns  Chair/bed transfer assist level: Minimal Assistance - Patient > 75% (supervision stand pivot with RW, CGA/min A with no AD)     Locomotion Ambulation   Ambulation assist   Ambulation activity did not occur: Safety/medical concerns  Assist level: Contact Guard/Touching assist Assistive device: Walker-rolling Max distance: 10   Walk 10 feet activity   Assist  Walk 10 feet activity did not occur: Safety/medical concerns        Walk 50 feet activity   Assist Walk  50 feet with 2 turns activity did not occur: Safety/medical concerns         Walk 150 feet activity   Assist Walk 150 feet activity did not occur: Safety/medical concerns         Walk 10 feet on uneven surface  activity   Assist Walk 10 feet on uneven surfaces activity did not occur: Safety/medical concerns         Wheelchair     Assist Is the patient using a wheelchair?: Yes Type of Wheelchair: Manual Wheelchair activity did not occur: Safety/medical concerns  Wheelchair assist level: Independent Max wheelchair distance: 150    Wheelchair 50 feet with 2 turns activity    Assist    Wheelchair 50 feet with 2 turns activity did not occur: Safety/medical concerns   Assist Level: Independent   Wheelchair 150 feet activity     Assist  Wheelchair 150 feet activity did not occur: Safety/medical concerns   Assist Level: Independent   Blood pressure 136/75, pulse 71, temperature 98.3 F (36.8 C), resp. rate 16, height 5\' 10"  (1.778 m), weight (!) 156.2 kg, SpO2 100%.  Medical Problem List and Plan: 1. 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  Antithrombotics: -DVT/anticoagulation:  Pharmaceutical: Lovenox 150 mg BID until Coumadin/INR therapeutic>> now on coumadin only -11/11 INR therapeutic level             -antiplatelet therapy: n/a 3. Pain Management: chronic pain and neuropathy-  Con't tylenol prn, Oxycodone 5-15 mg q4 hours prn and gabapentin  scheduled- as well as Robaxin prn  -11/4 increase gabapentin to 600 mg 3 times daily from twice daily 11/8 increase gabapentin to 800 mg 3 times daily for phantom pain , mirror therapy 11/11 pain controlled overall, continue current regimen for now.   Consider decreasing oxycodone dose later this week 11/13 pain controlled overall, continue current regimen. Pt reports back pain similar to kidneys stone pain yesterday- this has resolved, continue to monitor   11/14 oxycodone dose adjusted to 5 to 10 mg  4. Mood/Behavior/Sleep/Anxiety/Depression:              -Continue sertraline 50mg  daily and Gabapentin 600mg  BID             Also team milieu to help with mood and  anxiety -antipsychotic agents: n/a  5. Neuropsych/cognition: This patient is capable of making decisions on his own behalf. 6. Skin/Wound Care: has VAC -for 7 days from 07/27/23  -Continue vitamins -11/5 wound VAC was removed yesterday -- 11/14 discussed wound and drainage with Dr. Lajoyce Corners yesterday, Dr. Huel Cote was able to look at the wound image.  Dr. Audrie Lia drainage expected due to increased volume adipose tissue -11/15 reported to be decreasing last 2 days   7. Fluids/Electrolytes/Nutrition/chronic hyponatremia: current Na 132- will recheck in AM             -Continue vitamins and supplements  -11/11 sodium overall stable at 133   -11/13 Na stable at 132  8. Diabetes type 2 with polyneuropathy, poorly controlled, Hgb A1C 10.2: SSI, increased semglee to 35U on d/c from acute, metformin to be resumed upon admission to CIR 1000 mg (home dose) BID-AC  -11/5 Increase semglee to 38 u daily as CBGs have been a little elevated -11/6 monitor response to medication change today, CBGs a little better today  -11/11 will change from regular metformin to metformin XR 1000 mg  11/15 controlled continue current regimen CBG (last 3)  Recent Labs    08/11/23 1631 08/11/23 2052 08/12/23 0629  GLUCAP 111* 130* 142*    9. Acute on chronic anemia: Hgb 7.4 after 1U PRBCs on 11/1, transfuse if <7 -11/4 patient was noted to have some slight bleeding around his backside.  Overall hemoglobin stable at 7.9 today.  -11/5 bleeding appears to be improved, recheck CBC Thursday -11/7 Transfuse 1 U  PRBC due to anemia with hemoglobin 6.6.  Discussed with pharmacy, pharmacy will stop Lovenox bridging since INR likely to be therapeutic in the morning. -11/8 recheck with Hgb 7.0 this morning, will recheck again today- 7.1,FOBT pending -11/11 Hgb stable at 7.6 today -11/13 HGB 7.1, recheck tomorrow , asked nursing to check FOBT  -11/15 hemoglobin stable at 7.5 yesterday, recheck tomorrow    10. HTN: continue metoprolol 25mg  BID and lisinopril 20mg  daily  -11/7 BP a little soft at times, decrease lisinopril to 10 mg daily  -11/8 will stop lisinopril for now  -08/06/23 BPs soft but stable, cont regimen for now.   -11/15 BP controlled Vitals:   08/08/23 1925 08/09/23 0437 08/09/23 1300 08/09/23 1908  BP: 113/78 118/70 112/68 134/73   08/10/23 0403 08/10/23 1450 08/10/23 1920 08/10/23 2007  BP: 110/64 (!) 149/54 122/77 122/77   08/11/23 0505 08/11/23 1309 08/11/23 1955 08/12/23 0553  BP: 138/64 118/73 130/73 136/75     11. HLD: continue zetia 10mg  daily 12. CAD and mechanical aortic valve: on coumadin resumed on 07/28/23, pharmacy following, bridging with lovenox>> now off lovenox 13. GERD: continue protonix 40mg  daily 14. Morbid obesity: provide dietary education 15. Thrombocytosis: likely reactive, recheck tomorrow  -Improved to 371 on 11/8  -11/14 stable at 306 16. MRSA positive nasal swab: mupirocin BID 17. L BKA stump infection- done with IV ABX since amputation removed infection. .  18. Microscopic hematuria- has nonobstructive renal stones- monitor clinically.  19.  Constipation.  Improved after bowel movements on 11/6 and 11/7  -11/8 will start MiraLAX daily to try to prevent further constipation  -11/15 last BM 11/12, KUB should help determine stool burden, Senokot 2 tabs at bedtime, MiraLAX scheduled daily 20. Azotemia  -Will stop lisinopril and monitor  -Recheck tomorrow  21.  Nephrolithiasis  -Patient had a abdominal CT on 06/28/2023 finding nonobstructing left renal  stones  -11/15 will recheck KUB and  ultrasound bladder kidneys, check UA, Check BMP and CBC tomorrow am. Continue above medications for pain control  Addendum- U/A with pyuria but no nitrates. Suspect most likely related to renal stone not UTI - will check urine culture  LOS: 12 days A FACE TO FACE EVALUATION WAS PERFORMED  Fanny Dance 08/12/2023, 8:41 AM

## 2023-08-12 NOTE — Progress Notes (Signed)
Occupational Therapy Session Note  Patient Details  Name: Adam Long MRN: [id number] Date of Birth: [birth year]  Today's Date: 08/12/2023 OT Individual Time: 4098-1191 & 4782-9562 OT Individual Time Calculation (min): 55 min & 70 min   Short Term Goals: Week 2:  OT Short Term Goal 1 (Week 2): STGs=LTGs due to patient's length of stay.  Skilled Therapeutic Interventions/Progress Updates:  Session 1 Skilled OT intervention completed with focus on pain management, limb care/education. Pt received supine in bed, agreeable to session. Un-rated back pain and nausea reported; nurse notified of request for pain/nausea meds per review of active order for Zofran. OT offered hot pack on back region, and repositioning throughout for pain reduction.  Pt limited this morning by lack of sleep from inability to get comfortable from kidney stone related back pain but agreeable to sit EOB. Transitioned with mod I. Had discussion with MD about clearance for showers if able to keep wound covered/dry/wound care completed following shower if soiled bandages with MD clearing pt to do so (see revised H&P note 11/15), however pt deferring to PM session due to current fatigue. MD aware of kidney stone related pain.  Hot pack applied with adaptive sling via ACE wrap to secure it to the back region at site of pain while sitting. Diet ginger ale provided due to nausea and emesis bag however no active vomiting.   Pt was able to sit EOB for amputation limb care, shrinker and prosthesis education. Handout issued to pt on proper care of limb, positioning, basic bed level exercises/stretches. OT replaced rehab hot pack for disposable hot packs to keep heat in place after session.  Pt remained seated EOB, with bed alarm on/activated, and with all needs in reach at end of session.  Session 2 Skilled OT intervention completed with focus on ADL retraining, functional endurance, and mobility within a shower context. Pt  received seated in w/c, agreeable to session. 6/10 pain reported in LLE; pre-medicated. OT offered rest breaks, repositioning and moist heat via shower for pain relief.  Pt completed all squat pivot transfers with CGA to various surfaces including w/c <> bari DABSC using grab bar and w/c > EOB. Min cues needed for body positioning.  Self-propelled in w/c > shower. Per MD, waterproof cover applied to Lt AKA prior to shower to maintain wound integrity. Pt was able to bathe all parts with set up A, at the seated level while using grab bar for balance. Utilized long handled sponge to access BLE and periareas through hole of the BSC. Squat pivot with grab bar > w/c > EOB. Able to donn shirt/deo with set up A. Transitioned supine with mod I.   Per nursing, wound care needed as it is daily but hadn't been done yet. Doffed abdominal pad with bandage noted to be heavily soiled with blood, though no active drainage on limb, however purulent drainage in one area; PA notified stating surgeon aware and planning to consult this afternoon. OT applied non-adherent pad, abdominal pad to incision, secured with soft tape, then applied wide length ACE wrap in figure 8 position with hip anchor for edema management as current grey AKA shrinker in room size small but at minimum a medium-large is needed for current stump size; [employer] notified of need for size large shrinkers.  Pt threaded LB clothing with set up A at bed level. Pt remained semi upright in bed set up with late lunch that arrived, with bed alarm on/activated, and with all needs in reach at  end of session.   Therapy Documentation Precautions:  Precautions Precautions: Fall Restrictions Weight Bearing Restrictions: Yes LLE Weight Bearing: Non weight bearing    Therapy/Group: Individual Therapy  Melvyn Novas, MS, OTR/L  08/12/2023, 3:46 PM

## 2023-08-12 NOTE — Progress Notes (Signed)
Physical Therapy Session Note  Patient Details  Name: Adam Long MRN: [id number] Date of Birth: 1975-11-28  Today's Date: 08/12/2023 PT Individual Time: 4782-9562 PT Individual Time Calculation (min): 71 min   Short Term Goals: Week 2:  PT Short Term Goal 1 (Week 2): pt will ambulate 10 feet with LRAD PT Short Term Goal 2 (Week 2): Pt will perform squat pivot transfer with min A or less consistently PT Short Term Goal 3 (Week 2): pt will tolerate donning grey shrinker on L LE  Skilled Therapeutic Interventions/Progress Updates: Patient supine in bed on entrance to room. Patient alert and agreeable to PT session.   Patient reported no pain but does endorse that stomach felt a little queasy (stated some vomiting earlier this morning). Pt also stated not eating breakfast due to nauseas feeling (provided with ensure during session).     Therapeutic Activity: Bed Mobility: Pt performed supine<>sit on EOB with supervision (HOB elevated.  Transfers: Pt performed sit<>stand transfers throughout session with supervision for safety (EOB slightly elevated).   Wheelchair Mobility:  Pt propelled wheelchair x 3 around day room/nsg loop with supervision. Pt required a few short resting breaks (pt recalled previous cues of energy conservation).   Therapeutic Exercise: Pt performed the following exercises with therapist providing the described cuing and facilitation for improvement. - 2 x 15 LAQ short sitting edge of bed with 7lb ankle weight on R LE. VC required for pt to perform slow and controlled movements.  - Pt ambulated 7' from [facility] to edge of mat with CGA for safety. Pt performed task without LOB and with RW.  - Cross shops x 10 reps with PTA providing demonstrative cues with minor adjustments at beginning for pt to obtain correct sequence. Pt required a rest break and performed another round (done with soccer ball). Pt progressed 2nd round to 2kg green ball and performed x 7 reps with VC to  maintain sequence. Pt also cued to breathe throughout intervention.   Manual Therapy: - Soft tissue mobilization to B upper traps per pt report of UE stiffness. Pt then performed levator scapular stretch (2 x 10 seconds bilaterally) and lateral stretch of neck musculature 1 x 10 seconds. Pt provided with VC/demonstrative cues for sequence.  Patient sitting in WC with NT present at end of session with brakes locked and all needs in reach      Therapy Documentation Precautions:  Precautions Precautions: Fall Precaution Comments: wound vac L residual limb Restrictions Weight Bearing Restrictions: Yes LLE Weight Bearing: Non weight bearing  Therapy/Group: Individual Therapy  Nasreen Goedecke PTA 08/12/2023, 12:10 PM

## 2023-08-13 DIAGNOSIS — R3129 Other microscopic hematuria: Secondary | ICD-10-CM

## 2023-08-13 DIAGNOSIS — R195 Other fecal abnormalities: Secondary | ICD-10-CM

## 2023-08-13 LAB — URINALYSIS, W/ REFLEX TO CULTURE (INFECTION SUSPECTED)
Bilirubin Urine: NEGATIVE
Glucose, UA: NEGATIVE mg/dL
Ketones, ur: NEGATIVE mg/dL
Nitrite: NEGATIVE
Protein, ur: NEGATIVE mg/dL
Specific Gravity, Urine: 1.003 — ABNORMAL LOW (ref 1.005–1.030)
pH: 5 (ref 5.0–8.0)

## 2023-08-13 LAB — BASIC METABOLIC PANEL
Anion gap: 8 (ref 5–15)
BUN: 19 mg/dL (ref 6–20)
CO2: 25 mmol/L (ref 22–32)
Calcium: 8.7 mg/dL — ABNORMAL LOW (ref 8.9–10.3)
Chloride: 103 mmol/L (ref 98–111)
Creatinine, Ser: 1.38 mg/dL — ABNORMAL HIGH (ref 0.61–1.24)
GFR, Estimated: >60 mL/min (ref >60)
Glucose, Bld: 115 mg/dL — ABNORMAL HIGH (ref 70–99)
Potassium: 4.2 mmol/L (ref 3.5–5.1)
Sodium: 136 mmol/L (ref 135–145)

## 2023-08-13 LAB — PROTIME-INR
INR: 2.5 — ABNORMAL HIGH (ref 0.8–1.2)
Prothrombin Time: 27.5 s — ABNORMAL HIGH (ref 11.4–15.2)

## 2023-08-13 LAB — CBC
HCT: 25.3 % — ABNORMAL LOW (ref 39.0–52.0)
Hemoglobin: 7.4 g/dL — ABNORMAL LOW (ref 13.0–17.0)
MCH: 21.4 pg — ABNORMAL LOW (ref 26.0–34.0)
MCHC: 29.2 g/dL — ABNORMAL LOW (ref 30.0–36.0)
MCV: 73.3 fL — ABNORMAL LOW (ref 80.0–100.0)
Platelets: 313 10*3/uL (ref 150–400)
RBC: 3.45 MIL/uL — ABNORMAL LOW (ref 4.22–5.81)
RDW: 17.8 % — ABNORMAL HIGH (ref 11.5–15.5)
WBC: 7.3 10*3/uL (ref 4.0–10.5)
nRBC: 0 % (ref 0.0–0.2)

## 2023-08-13 LAB — GLUCOSE, CAPILLARY
Glucose-Capillary: 114 mg/dL — ABNORMAL HIGH (ref 70–99)
Glucose-Capillary: 90 mg/dL (ref 70–99)
Glucose-Capillary: 134 mg/dL — ABNORMAL HIGH (ref 70–99)
Glucose-Capillary: 108 mg/dL — ABNORMAL HIGH (ref 70–99)

## 2023-08-13 LAB — OCCULT BLOOD X 1 CARD TO LAB, STOOL: Fecal Occult Bld: POSITIVE — AB

## 2023-08-13 NOTE — Progress Notes (Signed)
Occupational Therapy Session Note  Patient Details  Name: Adam Long MRN: [id number] Date of Birth: [birth year]  Today's Date: 08/13/2023 OT Individual Time: 0700-0810 OT Individual Time Calculation (min): 70 min    Short Term Goals: Week 2:  OT Short Term Goal 1 (Week 2): STGs=LTGs due to patient's length of stay.  Skilled Therapeutic Interventions/Progress Updates:  Pt greeted resting in bed for skilled OT session with focus on BADL participation.   Pain: Pt reported 5/10 pain in residual limb, stating "it's not real bad this morning." OT offering intermediate rest breaks and positioning suggestions throughout session to address pain/fatigue and maximize participation/safety in session.   Functional Transfers: Pt performs bed mobility with supervision + use of bed rail. Stand-pivot from EOB<>BSC with supervision + UE support on bed rail & BSC arm rest. Pt performs partial STS from [facility] with unilateral support on arm-rest to manage LB garments, supervision.   Self Care Tasks: Pt and OT discuss DME recs, pt stating his family has an extra TTB and he plans to purchase toilet tongs. Pt already owns sock-aid, reacher, and BSC. Pt continues to state his uncle ". . . Is working on them" in regards to stairs. OT re-iterates that a ramp needs to be installed prior to DC in 4 days. Pt requires A for peri-care due to toilet tongs available to use (only for demo), pt able to tolerate standing upwards of 2 mins to assist with care, returning to supine for thorough cleaning with setup A. Pt dons LB garments at bed-level with setup/supervision. Sitting at sink-side, pt completes UB care with Mod I. Pt requires light Min A for donning tennis shoe.   Pt and OT discuss home kitchen setup, discuss focused on verbally reviewing simple meal prep at [facility] level.   Pt remained resting in bed with 4Ps assessed and immediate needs met. Pt continues to be appropriate for skilled OT intervention to promote further  functional independence in ADLs/IADLs.   Therapy Documentation Precautions:  Precautions Precautions: Fall Precaution Comments: wound vac L residual limb Restrictions Weight Bearing Restrictions: Yes LLE Weight Bearing: Non weight bearing   Therapy/Group: Individual Therapy  Lou Cal, OTR/L, MSOT  08/13/2023, 5:16 AM

## 2023-08-13 NOTE — Progress Notes (Signed)
PHARMACY - ANTICOAGULATION CONSULT NOTE  Pharmacy Consult for warfarin Indication:  hx mechanical AVR  No Known Allergies  Patient Measurements: Height: 5\' 10"  (177.8 cm) Weight: (!) 156.2 kg (344 lb 5.7 oz) IBW/kg (Calculated) : 73  Vital Signs: Temp: 98 F (36.7 C) (11/16 0513) BP: 114/62 (11/16 0513) Pulse Rate: 81 (11/16 0513)  Labs: Recent Labs    08/11/23 0627 08/12/23 0542 08/13/23 0457  HGB 7.5*  --  7.4*  HCT 25.5*  --  25.3*  PLT 306  --  313  LABPROT 27.7* 25.7* 27.5*  INR 2.6* 2.3* 2.5*  CREATININE  --   --  1.38*    Estimated Creatinine Clearance: 99.5 mL/min (A) (by C-G formula based on SCr of 1.38 mg/dL (H)).  Assessment: 47 yo male admitted for L BKA stump infection. Patient is on warfarin PTA for history of mechanical AVR. Warfarin held x 2 days for BKA, resumed 10/31.  INR remains therapeutic at 2.5. Had trended down to 2.3 on 11/15, and rechecked today to be follow trend. Hemoglobin low stable, PRBCs given on 11/7.  Has been on warfarin 10 mg daily since 11/10.  FOB+ this am. Plan to recheck FOB with next BM and recheck CBC in am. Continue Warfarin per discussion with M. Street, PA-C  PTA warfarin regimen: 5 mg MWF, 10 mg TTSS.   Goal of Therapy:  INR 2-3 Monitor platelets by anticoagulation protocol: Yes   Plan:  Continue warfarin 10 mg daily. Has standing order for PT/INR MWF.  Will recheck PT/INR in am, since checking CBC and FOB+ today. Follow up next FOB check  Dennie Fetters, RPh 08/13/2023,12:37 PM

## 2023-08-13 NOTE — Progress Notes (Signed)
PROGRESS NOTE   Subjective/Complaints:  Pt doing well this morning, slept well, pain well controlled. Flank pain and urinary symptoms resolved, very thankful for that. Had a "good" bowel movement this morning, also thankful for that. Urinating fine. Denies any other complaints or concerns today.   ROS: Patient denies fever, chills, abdominal pain, n/v shortness of breath, chest pain, new motor or sensory changes +phantom pain-improved + Diarrhea-improved  Objective:   US RENAL  Result Date: 08/12/2023 CLINICAL DATA:  99102 Nephrolithiasis 99102 EXAM: RENAL / URINARY TRACT ULTRASOUND COMPLETE COMPARISON:  06/28/2023 FINDINGS: Right Kidney: Renal measurements: 11.9 x 5.7 x 6.2 cm = volume: 222 mL. Echogenicity within normal limits. No mass or hydronephrosis visualized. Left Kidney: Renal measurements: 14.8 x 7.7 x 6.9 cm = volume: 413 mL. Echogenicity within normal limits. No mass or hydronephrosis visualized. Bladder: Appears normal for degree of bladder distention. Other: Echogenic appearance of the imaged hepatic parenchyma. Technical note: Limited study secondary to poor penetration related to patient body habitus. IMPRESSION: 1. No hydronephrosis. The renal stones seen on prior CT were not visible sonographically. 2. Hepatic steatosis. Electronically Signed   By: Duanne Guess D.O.   On: 08/12/2023 20:24   DG Abd 2 Views  Result Date: 08/12/2023 CLINICAL DATA:  Abdominal pain EXAM: ABDOMEN - 2 VIEW COMPARISON:  06/28/2023 FINDINGS: Supine and upright frontal views of the abdomen and pelvis are obtained. Bilateral flanks are excluded by collimation and patient body habitus. No bowel obstruction or ileus. No masses or abnormal calcifications. No free gas in the greater peritoneal sac. Lung bases are clear. IMPRESSION: 1. Unremarkable bowel gas pattern. Electronically Signed   By: Sharlet Salina M.D.   On: 08/12/2023 18:49   Recent  Labs    08/11/23 0627 08/13/23 0457  WBC 6.8 7.3  HGB 7.5* 7.4*  HCT 25.5* 25.3*  PLT 306 313   Recent Labs    08/13/23 0457  NA 136  K 4.2  CL 103  CO2 25  GLUCOSE 115*  BUN 19  CREATININE 1.38*  CALCIUM 8.7*    Intake/Output Summary (Last 24 hours) at 08/13/2023 1018 Last data filed at 08/13/2023 0346 Gross per 24 hour  Intake 480 ml  Output 3225 ml  Net -2745 ml        Physical Exam: Vital Signs Blood pressure 114/62, pulse 81, temperature 98 F (36.7 C), resp. rate 18, height 5\' 10"  (1.778 m), weight (!) 156.2 kg, SpO2 98%.   General: No acute distress, lying in bed HEENT: Head is normocephalic, atraumatic, MMM Heart: Reg rate and rhythm. Mechanical heart valve sound Chest: CTA bilaterally without wheezes, rales, or rhonchi; no distress Abdomen: Soft, non-tender, nondistended, bowel sounds positive Extremities:  L AKA Psych: Cooperative, pleasant, happy Skin: Clean and intact without signs of breakdown, L AKA wrapped.  Venous stasis changes on RLE from mid calf downwards- and 1+ LE edema from R ankle  PRIOR EXAMS: Neuro:  Mental Status: He is alert and oriented to person, place, and time. Mental status is at baseline.     Comments: Decreased to light touch in RLE from knee downwards - chronic Musculoskeletal:   Cervical back: Neck supple.  No  flank, lower back, or thoracic spine area TTP noted today.    Comments: Ues 5/5 in biceps, triceps, WE, grip and FA B/L RLE- 5-/5 in HF, KE, DF and PF LLE- 4- /5 in HF L AKA -dry dressing in place wearing shrinker   Assessment/Plan: 1. Functional deficits which require 3+ hours per day of interdisciplinary therapy in a comprehensive inpatient rehab setting. Physiatrist is providing close team supervision and 24 hour management of active medical problems listed below. Physiatrist and rehab team continue to assess barriers to discharge/monitor patient progress toward functional and medical goals  Care  Tool:  Bathing    Body parts bathed by patient: Right arm, Left arm, Chest, Abdomen, Front perineal area, Buttocks, Right upper leg, Left upper leg, Right lower leg, Face   Body parts bathed by helper: Buttocks, Right lower leg, Left upper leg Body parts n/a: Left lower leg   Bathing assist Assist Level: Set up assist     Upper Body Dressing/Undressing Upper body dressing   What is the patient wearing?: Pull over shirt    Upper body assist Assist Level: Set up assist    Lower Body Dressing/Undressing Lower body dressing      What is the patient wearing?: Pants     Lower body assist Assist for lower body dressing: Set up assist     Toileting Toileting Toileting Activity did not occur (Clothing management and hygiene only): N/A (no void or bm)  Toileting assist Assist for toileting: Moderate Assistance - Patient 50 - 74%     Transfers Chair/bed transfer  Transfers assist  Chair/bed transfer activity did not occur: Safety/medical concerns  Chair/bed transfer assist level: Minimal Assistance - Patient > 75% (supervision stand pivot with RW, CGA/min A with no AD)     Locomotion Ambulation   Ambulation assist   Ambulation activity did not occur: Safety/medical concerns  Assist level: Contact Guard/Touching assist Assistive device: Walker-rolling Max distance: 10   Walk 10 feet activity   Assist  Walk 10 feet activity did not occur: Safety/medical concerns        Walk 50 feet activity   Assist Walk 50 feet with 2 turns activity did not occur: Safety/medical concerns         Walk 150 feet activity   Assist Walk 150 feet activity did not occur: Safety/medical concerns         Walk 10 feet on uneven surface  activity   Assist Walk 10 feet on uneven surfaces activity did not occur: Safety/medical concerns         Wheelchair     Assist Is the patient using a wheelchair?: Yes Type of Wheelchair: Manual Wheelchair activity did not  occur: Safety/medical concerns  Wheelchair assist level: Independent Max wheelchair distance: 150    Wheelchair 50 feet with 2 turns activity    Assist    Wheelchair 50 feet with 2 turns activity did not occur: Safety/medical concerns   Assist Level: Independent   Wheelchair 150 feet activity     Assist  Wheelchair 150 feet activity did not occur: Safety/medical concerns   Assist Level: Independent   Blood pressure 114/62, pulse 81, temperature 98 F (36.7 C), resp. rate 18, height 5\' 10"  (1.778 m), weight (!) 156.2 kg, SpO2 98%.  Medical Problem List and Plan: 1. 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  Antithrombotics: -DVT/anticoagulation:  Pharmaceutical: Lovenox 150 mg BID until Coumadin/INR therapeutic>> now on coumadin only -11/11 INR therapeutic level             -antiplatelet therapy: n/a 3. Pain Management: chronic pain and neuropathy-  Con't tylenol prn, Oxycodone 5-15 mg q4 hours prn and gabapentin  scheduled- as well as Robaxin prn  -11/4 increase gabapentin to 600 mg 3 times daily from twice daily 11/8 increase gabapentin to 800 mg 3 times daily for phantom pain , mirror therapy 11/11 pain controlled overall, continue current regimen for now.  Consider decreasing oxycodone dose later this week 11/13 pain controlled overall, continue current regimen. Pt reports back pain similar to kidneys stone pain yesterday- this has resolved, continue to monitor   11/14 oxycodone dose adjusted to 5 to 10 mg  4. Mood/Behavior/Sleep/Anxiety/Depression:              -Continue sertraline 50mg  daily and Gabapentin 600mg  BID             Also team milieu to help with mood and anxiety -antipsychotic agents: n/a  5.  Neuropsych/cognition: This patient is capable of making decisions on his own behalf. 6. Skin/Wound Care: has VAC -for 7 days from 07/27/23  -Continue vitamins -11/5 wound VAC was removed yesterday -- 11/14 discussed wound and drainage with Dr. Lajoyce Corners yesterday, Dr. Huel Cote was able to look at the wound image.  Dr. Audrie Lia drainage expected due to increased volume adipose tissue -11/15 reported to be decreasing last 2 days   7. Fluids/Electrolytes/Nutrition/chronic hyponatremia: current Na 132- will recheck in AM             -Continue vitamins and supplements  -11/11 sodium overall stable at 133   -11/13 Na stable at 132 -08/13/23 Na 136, Cr a little up at 1.38 but likely from ?passed kidney stone; monitor  8. Diabetes type 2 with polyneuropathy, poorly controlled, Hgb A1C 10.2: SSI, increased semglee to 35U on d/c from acute, metformin to be resumed upon admission to CIR 1000 mg (home dose) BID-AC  -11/5 Increase semglee to 38 u daily as CBGs have been a little elevated -11/6 monitor response to medication change today, CBGs a little better today  -11/11 will change from regular metformin to metformin XR 1000 mg  11/15 controlled continue current regimen CBG (last 3)  Recent Labs    08/12/23 1623 08/12/23 2056 08/13/23 0617  GLUCAP 102* 123* 114*    9. Acute on chronic anemia: Hgb 7.4 after 1U PRBCs on 11/1, transfuse if <7 -11/4 patient was noted to have some slight bleeding around his backside.  Overall hemoglobin stable at 7.9 today.  -11/5 bleeding appears to be improved, recheck CBC Thursday -11/7 Transfuse 1 U PRBC due to anemia with hemoglobin 6.6.  Discussed with pharmacy, pharmacy will stop Lovenox bridging since INR likely to be therapeutic in the morning. -11/8 recheck with Hgb 7.0 this morning, will recheck again today- 7.1,FOBT pending -11/11 Hgb stable at 7.6 today -11/13 HGB 7.1, recheck tomorrow , asked nursing to check FOBT -11/15 hemoglobin stable at 7.5 yesterday,  recheck tomorrow -08/13/23 Hgb stable 7.4; FOBT+ today-- will recheck CBC in the AM, and another FOBT with next BM; pt stable for now, will hold off calling GI yet.  10. HTN: continue metoprolol 25mg  BID and lisinopril 20mg  daily  -11/7 BP a little soft at times, decrease lisinopril to 10 mg daily  -11/8 will stop lisinopril for now  -08/06/23 BPs soft but stable, cont regimen for now.   -11/15-16 BP controlled Vitals:   08/09/23 1908 08/10/23 0403 08/10/23 1450 08/10/23 1920  BP: 134/73 110/64 (!) 149/54 122/77   08/10/23 2007 08/11/23 0505 08/11/23 1309 08/11/23 1955  BP: 122/77 138/64 118/73 130/73   08/12/23 0553 08/12/23 1536 08/12/23 1926 08/13/23 0513  BP: 136/75 (!) 115/56 (!) 122/58 114/62     11. HLD: continue zetia 10mg  daily 12. CAD and mechanical aortic valve: on coumadin resumed on 07/28/23, pharmacy following, bridging with lovenox>> now off lovenox 13. GERD: continue protonix 40mg  daily 14. Morbid obesity: provide dietary education 15. Thrombocytosis: likely reactive, recheck tomorrow  -Improved to 371 on 11/8  -11/14 stable at 306--313 on 08/13/23 16. MRSA positive nasal swab: mupirocin BID 17. L BKA stump infection- done with IV ABX since amputation removed infection. .  18. Microscopic hematuria- has nonobstructive renal stones- monitor clinically.  19.  Constipation.  Improved after bowel movements on 11/6 and 11/7  -11/8 will start MiraLAX daily to try to prevent further constipation  -11/15 last BM 11/12, KUB should help determine stool burden, Senokot 2 tabs at bedtime, MiraLAX scheduled daily  -08/13/23 KUB unremarkable, finally had a good BM today, monitor 20. Azotemia  -Will stop lisinopril and monitor -08/13/23 Cr 1.38, likely from recently passed kidney stone; will recheck Monday.  21.  Nephrolithiasis -Patient had a abdominal CT on 06/28/2023 finding nonobstructing left renal stones -11/15 will recheck KUB and ultrasound bladder kidneys, check UA,  Check BMP and CBC tomorrow am. Continue above medications for pain control Addendum- U/A with pyuria but no nitrates. Suspect most likely related to renal stone not UTI - will check urine culture -08/13/23 flank pain resolved, suspect passed kidney stone, renal US fairly unremarkable but sounds like maybe stone had passed. UCx not done yet, but urinary symptoms fully resolved; monitor  LOS: 13 days A FACE TO FACE EVALUATION WAS PERFORMED  [address] 08/13/2023, 10:18 AM

## 2023-08-14 LAB — CBC
HCT: 26 % — ABNORMAL LOW (ref 39.0–52.0)
Hemoglobin: 7.4 g/dL — ABNORMAL LOW (ref 13.0–17.0)
MCH: 21 pg — ABNORMAL LOW (ref 26.0–34.0)
MCHC: 28.5 g/dL — ABNORMAL LOW (ref 30.0–36.0)
MCV: 73.9 fL — ABNORMAL LOW (ref 80.0–100.0)
Platelets: 315 10*3/uL (ref 150–400)
RBC: 3.52 MIL/uL — ABNORMAL LOW (ref 4.22–5.81)
RDW: 17.9 % — ABNORMAL HIGH (ref 11.5–15.5)
WBC: 6.9 10*3/uL (ref 4.0–10.5)
nRBC: 0 % (ref 0.0–0.2)

## 2023-08-14 LAB — GLUCOSE, CAPILLARY
Glucose-Capillary: 128 mg/dL — ABNORMAL HIGH (ref 70–99)
Glucose-Capillary: 88 mg/dL (ref 70–99)
Glucose-Capillary: 136 mg/dL — ABNORMAL HIGH (ref 70–99)
Glucose-Capillary: 153 mg/dL — ABNORMAL HIGH (ref 70–99)

## 2023-08-14 LAB — PROTIME-INR
INR: 2.5 — ABNORMAL HIGH (ref 0.8–1.2)
Prothrombin Time: 27 s — ABNORMAL HIGH (ref 11.4–15.2)

## 2023-08-14 NOTE — Progress Notes (Signed)
PROGRESS NOTE   Subjective/Complaints:  Pt doing well again this morning, slept well, pain well controlled. LBM yesterday, had 2 total. Urinating fine. Denies any other complaints or concerns today.   ROS: Patient denies fever, chills, abdominal pain, n/v shortness of breath, chest pain, new motor or sensory changes, lightheadedness/dizziness +phantom pain-improved + Diarrhea-improved  Objective:   US RENAL  Result Date: 08/12/2023 CLINICAL DATA:  99102 Nephrolithiasis 99102 EXAM: RENAL / URINARY TRACT ULTRASOUND COMPLETE COMPARISON:  06/28/2023 FINDINGS: Right Kidney: Renal measurements: 11.9 x 5.7 x 6.2 cm = volume: 222 mL. Echogenicity within normal limits. No mass or hydronephrosis visualized. Left Kidney: Renal measurements: 14.8 x 7.7 x 6.9 cm = volume: 413 mL. Echogenicity within normal limits. No mass or hydronephrosis visualized. Bladder: Appears normal for degree of bladder distention. Other: Echogenic appearance of the imaged hepatic parenchyma. Technical note: Limited study secondary to poor penetration related to patient body habitus. IMPRESSION: 1. No hydronephrosis. The renal stones seen on prior CT were not visible sonographically. 2. Hepatic steatosis. Electronically Signed   By: Duanne Guess D.O.   On: 08/12/2023 20:24   DG Abd 2 Views  Result Date: 08/12/2023 CLINICAL DATA:  Abdominal pain EXAM: ABDOMEN - 2 VIEW COMPARISON:  06/28/2023 FINDINGS: Supine and upright frontal views of the abdomen and pelvis are obtained. Bilateral flanks are excluded by collimation and patient body habitus. No bowel obstruction or ileus. No masses or abnormal calcifications. No free gas in the greater peritoneal sac. Lung bases are clear. IMPRESSION: 1. Unremarkable bowel gas pattern. Electronically Signed   By: Sharlet Salina M.D.   On: 08/12/2023 18:49   Recent Labs    08/13/23 0457 08/14/23 0504  WBC 7.3 6.9  HGB 7.4* 7.4*  HCT  25.3* 26.0*  PLT 313 315   Recent Labs    08/13/23 0457  NA 136  K 4.2  CL 103  CO2 25  GLUCOSE 115*  BUN 19  CREATININE 1.38*  CALCIUM 8.7*    Intake/Output Summary (Last 24 hours) at 08/14/2023 1025 Last data filed at 08/14/2023 0758 Gross per 24 hour  Intake 500 ml  Output 3500 ml  Net -3000 ml        Physical Exam: Vital Signs Blood pressure (!) 116/53, pulse 72, temperature 98 F (36.7 C), resp. rate 18, height 5\' 10"  (1.778 m), weight (!) 156.2 kg, SpO2 98%.   General: No acute distress, lying in bed, comfortable appearing HEENT: Head is normocephalic, atraumatic, MMM Heart: Reg rate and rhythm. Mechanical heart valve sound Chest: CTA bilaterally without wheezes, rales, or rhonchi; no distress Abdomen: Soft, non-tender, nondistended, bowel sounds positive Extremities:  L AKA Psych: Cooperative, pleasant, happy Skin: Clean and intact without signs of breakdown, L AKA wrapped.  Venous stasis changes on RLE from mid calf downwards- RLE without any edema today  PRIOR EXAMS: Neuro:  Mental Status: He is alert and oriented to person, place, and time. Mental status is at baseline.     Comments: Decreased to light touch in RLE from knee downwards - chronic Musculoskeletal:   Cervical back: Neck supple.  No flank, lower back, or thoracic spine area TTP noted today.  Comments: Ues 5/5 in biceps, triceps, WE, grip and FA B/L RLE- 5-/5 in HF, KE, DF and PF LLE- 4- /5 in HF L AKA -dry dressing in place wearing shrinker   Assessment/Plan: 1. Functional deficits which require 3+ hours per day of interdisciplinary therapy in a comprehensive inpatient rehab setting. Physiatrist is providing close team supervision and 24 hour management of active medical problems listed below. Physiatrist and rehab team continue to assess barriers to discharge/monitor patient progress toward functional and medical goals  Care Tool:  Bathing    Body parts bathed by patient: Right arm,  Left arm, Chest, Abdomen, Front perineal area, Buttocks, Right upper leg, Left upper leg, Right lower leg, Face   Body parts bathed by helper: Buttocks, Right lower leg, Left upper leg Body parts n/a: Left lower leg   Bathing assist Assist Level: Set up assist     Upper Body Dressing/Undressing Upper body dressing   What is the patient wearing?: Pull over shirt    Upper body assist Assist Level: Set up assist    Lower Body Dressing/Undressing Lower body dressing      What is the patient wearing?: Pants     Lower body assist Assist for lower body dressing: Set up assist     Toileting Toileting Toileting Activity did not occur (Clothing management and hygiene only): N/A (no void or bm)  Toileting assist Assist for toileting: Moderate Assistance - Patient 50 - 74%     Transfers Chair/bed transfer  Transfers assist  Chair/bed transfer activity did not occur: Safety/medical concerns  Chair/bed transfer assist level: Minimal Assistance - Patient > 75% (supervision stand pivot with RW, CGA/min A with no AD)     Locomotion Ambulation   Ambulation assist   Ambulation activity did not occur: Safety/medical concerns  Assist level: Contact Guard/Touching assist Assistive device: Walker-rolling Max distance: 10   Walk 10 feet activity   Assist  Walk 10 feet activity did not occur: Safety/medical concerns        Walk 50 feet activity   Assist Walk 50 feet with 2 turns activity did not occur: Safety/medical concerns         Walk 150 feet activity   Assist Walk 150 feet activity did not occur: Safety/medical concerns         Walk 10 feet on uneven surface  activity   Assist Walk 10 feet on uneven surfaces activity did not occur: Safety/medical concerns         Wheelchair     Assist Is the patient using a wheelchair?: Yes Type of Wheelchair: Manual Wheelchair activity did not occur: Safety/medical concerns  Wheelchair assist level:  Independent Max wheelchair distance: 150    Wheelchair 50 feet with 2 turns activity    Assist    Wheelchair 50 feet with 2 turns activity did not occur: Safety/medical concerns   Assist Level: Independent   Wheelchair 150 feet activity     Assist  Wheelchair 150 feet activity did not occur: Safety/medical concerns   Assist Level: Independent   Blood pressure (!) 116/53, pulse 72, temperature 98 F (36.7 C), resp. rate 18, height 5\' 10"  (1.778 m), weight (!) 156.2 kg, SpO2 98%.  Medical Problem List and Plan: 1. Functional deficits secondary to infected L BKA s/p AKA on 07/27/23              -patient may shower if able to keep incision dry             -  ELOS/Goals: 10-12 days- supervision to min A at w/c level  -Continue CIR -Patient beginning to tolerate shrinker use -Expected discharge 11/20    2.  Antithrombotics: -DVT/anticoagulation:  Pharmaceutical: Lovenox 150 mg BID until Coumadin/INR therapeutic>> now on coumadin only -11/11 INR therapeutic level             -antiplatelet therapy: n/a 3. Pain Management: chronic pain and neuropathy-  Con't tylenol prn, Oxycodone 5-15 mg q4 hours prn and gabapentin  scheduled- as well as Robaxin prn  -11/4 increase gabapentin to 600 mg 3 times daily from twice daily 11/8 increase gabapentin to 800 mg 3 times daily for phantom pain , mirror therapy 11/11 pain controlled overall, continue current regimen for now.  Consider decreasing oxycodone dose later this week 11/13 pain controlled overall, continue current regimen. Pt reports back pain similar to kidneys stone pain yesterday- this has resolved, continue to monitor   11/14 oxycodone dose adjusted to 5 to 10 mg  4. Mood/Behavior/Sleep/Anxiety/Depression:              -Continue sertraline 50mg  daily and Gabapentin 600mg  BID             Also team milieu to help with mood and anxiety -antipsychotic agents: n/a  5. Neuropsych/cognition: This patient is capable of making  decisions on his own behalf. 6. Skin/Wound Care: has VAC -for 7 days from 07/27/23  -Continue vitamins -11/5 wound VAC was removed yesterday -- 11/14 discussed wound and drainage with Dr. Lajoyce Corners yesterday, Dr. Huel Cote was able to look at the wound image.  Dr. Audrie Lia drainage expected due to increased volume adipose tissue -11/15 reported to be decreasing last 2 days   7. Fluids/Electrolytes/Nutrition/chronic hyponatremia: current Na 132- will recheck in AM             -Continue vitamins and supplements  -11/11 sodium overall stable at 133   -11/13 Na stable at 132 -08/13/23 Na 136, Cr a little up at 1.38 but likely from ?passed kidney stone; monitor  8. Diabetes type 2 with polyneuropathy, poorly controlled, Hgb A1C 10.2: SSI, increased semglee to 35U on d/c from acute, metformin to be resumed upon admission to CIR 1000 mg (home dose) BID-AC  -11/5 Increase semglee to 38 u daily as CBGs have been a little elevated -11/6 monitor response to medication change today, CBGs a little better today  -11/11 will change from regular metformin to metformin XR 1000 mg  -11/15-17 controlled continue current regimen CBG (last 3)  Recent Labs    08/13/23 1636 08/13/23 2044 08/14/23 0600  GLUCAP 134* 108* 128*    9. Acute on chronic anemia: Hgb 7.4 after 1U PRBCs on 11/1, transfuse if <7 -11/4 patient was noted to have some slight bleeding around his backside.  Overall hemoglobin stable at 7.9 today.  -11/5 bleeding appears to be improved, recheck CBC Thursday -11/7 Transfuse 1 U PRBC due to anemia with hemoglobin 6.6.  Discussed with pharmacy, pharmacy will stop Lovenox bridging since INR likely to be therapeutic in the morning. -11/8 recheck with Hgb 7.0 this morning, will recheck again today- 7.1,FOBT pending -11/11 Hgb stable at 7.6 today -11/13 HGB 7.1, recheck tomorrow , asked nursing to check FOBT -11/15 hemoglobin stable at 7.5 yesterday, recheck tomorrow -08/13/23 Hgb stable 7.4; FOBT+  today-- will recheck CBC in the AM, and another FOBT with next BM; pt stable for now, will hold off calling GI yet.  -08/14/23 Hgb again 7.4, stable, pt without symptoms and VSS, no further  BMs tested; likely would need outpatient GI f/up for FOBT+ stools and anemia, but doubt need for emergent consultation.    10. HTN: continue metoprolol 25mg  BID and lisinopril 20mg  daily  -11/7 BP a little soft at times, decrease lisinopril to 10 mg daily  -11/8 will stop lisinopril for now  -08/06/23 BPs soft but stable, cont regimen for now.   -11/15-17 BP controlled Vitals:   08/10/23 1920 08/10/23 2007 08/11/23 0505 08/11/23 1309  BP: 122/77 122/77 138/64 118/73   08/11/23 1955 08/12/23 0553 08/12/23 1536 08/12/23 1926  BP: 130/73 136/75 (!) 115/56 (!) 122/58   08/13/23 0513 08/13/23 1339 08/13/23 1957 08/14/23 0543  BP: 114/62 109/66 116/67 (!) 116/53     11. HLD: continue zetia 10mg  daily 12. CAD and mechanical aortic valve: on coumadin resumed on 07/28/23, pharmacy following, bridging with lovenox>> now off lovenox 13. GERD: continue protonix 40mg  daily 14. Morbid obesity: provide dietary education 15. Thrombocytosis: likely reactive, recheck tomorrow  -Improved to 371 on 11/8  -11/14 stable at 306--313 on 08/13/23--315 08/14/23 16. MRSA positive nasal swab: mupirocin BID 17. L BKA stump infection- done with IV ABX since amputation removed infection. .  18. Microscopic hematuria- has nonobstructive renal stones- monitor clinically.  19.  Constipation.  Improved after bowel movements on 11/6 and 11/7  -11/8 will start MiraLAX daily to try to prevent further constipation -11/15 last BM 11/12, KUB should help determine stool burden, Senokot 2 tabs at bedtime, MiraLAX scheduled daily  -08/13/23 KUB unremarkable, finally had a good BM today, monitor 20. Azotemia  -Will stop lisinopril and monitor -08/13/23 Cr 1.38, likely from recently passed kidney stone; will recheck Monday.  21.   Nephrolithiasis -Patient had a abdominal CT on 06/28/2023 finding nonobstructing left renal stones -11/15 will recheck KUB and ultrasound bladder kidneys, check UA, Check BMP and CBC tomorrow am. Continue above medications for pain control Addendum- U/A with pyuria but no nitrates. Suspect most likely related to renal stone not UTI - will check urine culture -08/13/23 flank pain resolved, suspect passed kidney stone, renal US fairly unremarkable but sounds like maybe stone had passed. UCx not done yet, but urinary symptoms fully resolved; monitor  LOS: 14 days A FACE TO FACE EVALUATION WAS PERFORMED  [address] Long 08/14/2023, 10:25 AM

## 2023-08-14 NOTE — Progress Notes (Signed)
PHARMACY - ANTICOAGULATION CONSULT NOTE  Pharmacy Consult for warfarin Indication:  hx mechanical AVR  No Known Allergies  Patient Measurements: Height: 5\' 10"  (177.8 cm) Weight: (!) 156.2 kg (344 lb 5.7 oz) IBW/kg (Calculated) : 73  Vital Signs: Temp: 98.3 F (36.8 C) (11/17 1255) Temp Source: Oral (11/17 1255) BP: 136/70 (11/17 1255) Pulse Rate: 81 (11/17 1255)  Labs: Recent Labs    08/12/23 0542 08/13/23 0457 08/14/23 0504  HGB  --  7.4* 7.4*  HCT  --  25.3* 26.0*  PLT  --  313 315  LABPROT 25.7* 27.5* 27.0*  INR 2.3* 2.5* 2.5*  CREATININE  --  1.38*  --     Estimated Creatinine Clearance: 99.5 mL/min (A) (by C-G formula based on SCr of 1.38 mg/dL (H)).  Assessment: 47 yo male admitted for L BKA stump infection. Patient is on warfarin PTA for history of mechanical AVR. Warfarin held x 2 days for BKA, resumed 10/31.  INR remains therapeutic at 2.5. Had trended down to 2.3 on 11/15, and rechecked over the weekend to be follow trend. Hemoglobin low stable, PRBCs given on 11/7.  Has been on warfarin 10 mg daily since 11/10.  FOB+ this on 11/16 and again 11/17. Continue Warfarin per discussion with M. Street, PA-C  PTA warfarin regimen: 5 mg MWF, 10 mg TTSS.   Goal of Therapy:  INR 2-3 Monitor platelets by anticoagulation protocol: Yes   Plan:  Continue warfarin 10 mg daily. PT/INR MWF.  Follow up FOB checks  Dennie Fetters, RPh 08/14/2023,2:45 PM

## 2023-08-14 NOTE — Progress Notes (Signed)
Occupational Therapy Session Note  Patient Details  Name: Adam Long MRN: [id number] Date of Birth: 14-Nov-1975  Today's Date: 08/14/2023 OT Individual Time: 8657-8469 OT Individual Time Calculation (min): 33 min    Short Term Goals: Week 2:  OT Short Term Goal 1 (Week 2): STGs=LTGs due to patient's length of stay.  Skilled Therapeutic Interventions/Progress Updates:  Pt greeted sitting in [facility] for skilled OT session with focus on caregiver education.   Pain: Pt with minimal pain in residual limb, OT offering intermediate rest breaks and positioning suggestions throughout session to address pain/fatigue and maximize participation/safety in session.   Self Care Tasks: Pt's cousin present for caregiver education with focus on OT role, OT POC, and current patient functioning. Bed mobility, functional transfers, LB dressing/bathing, and AE/DME recommendations reviewed. Pt's caregiver witnessing all transfers at supervision/mod I level. Education provided on BLE inspection and need for supervision/setup A level of assistance for bathing at shower-level due to equipment management and residual limb waterproofing. Caregiver receptive, all questions answered at end of session.   Pt remained resting in bed with direct handoff to PT, 4Ps assessed and immediate needs met. Pt continues to be appropriate for skilled OT intervention to promote further functional independence in ADLs/IADLs.   Therapy Documentation Precautions:  Precautions Precautions: Fall Precaution Comments: wound vac L residual limb Restrictions Weight Bearing Restrictions: Yes LLE Weight Bearing: Non weight bearing   Therapy/Group: Individual Therapy  Lou Cal, OTR/L, MSOT  08/14/2023, 6:59 AM

## 2023-08-14 NOTE — Progress Notes (Signed)
Slept good. Left AKA dressing CD&I, swelling to AKA.  Left hip dressing CD&I. RLE edema. Oxy ir 10mg 's given at 2021 and at 0623. Voids large amounts. LBM 11/16. Adam Long A

## 2023-08-14 NOTE — Progress Notes (Signed)
Nursing education done with family.

## 2023-08-14 NOTE — Progress Notes (Signed)
Physical Therapy Session Note  Patient Details  Name: Adam Long MRN: [id number] Date of Birth: 21-Dec-1975  Today's Date: 08/14/2023 PT Individual Time: 8657-8469 PT Individual Time Calculation (min): 57 min   Short Term Goals: Week 1:  PT Short Term Goal 1 (Week 1): Pt will perform STS with mod a PT Short Term Goal 1 - Progress (Week 1): Met PT Short Term Goal 2 (Week 1): Pt will perform bed/chair transfer with mod a PT Short Term Goal 2 - Progress (Week 1): Met PT Short Term Goal 3 (Week 1): Pt will propel w/c >100 ft PT Short Term Goal 3 - Progress (Week 1): Met  Skilled Therapeutic Interventions/Progress Updates:      Treatment Session 1  Pt supine in bed upon arrival. Pt agreeable to therapy. Pt denies any pain. Pt cousin present for family training.   Nurse present in room to provide education on dressing change, therapist provided education on changing dressing at least once daily, may need additional dressing change with increased bleeding. Pt verbalized understanding and agreeable.   Education provided on purpose of shrinker, education provided for Database administrator with pt providing assistance, with emphasis on pulling all the way up thigh and removing all wrinkles to prevent tourniquet. Education provided on Market researcher and frequency of changing shrinker. Education provided for daily skin inspection, on B LE with emphasis on between toes of R LE, education provided on signs and symptoms of infection, signs and symptoms of pressure injuries.Education provided on importance of contacting doctor if noticing any skin changes.  Education provided on wanting to limit any unnecessary friction/press on intact limb. Education provided on importance of pressure relief-every 2 hours in bed, every 30 minutes in WC. Education provided on hip flexion contracture prevention. Education provided on how to remove the push rims for improved wheelchair accessibility from bedroom to kitchen.   Pt and cousin verbalized understanding and agreeable.   Pt and pt cousin report the ramp has been installed. Pt and cousin report everywhere is accessible with WC, so pt will have no need to walk in the house.   Education provided that pt will need assistance at home for equipment management, dressing changes, donning/doffing shrinker, daily skin inspection, showering. Pt and cousin verbalized understanding and agreeable.   Education provided on purchasing bed rail. Pt performed squat pivot transfer with bed to WC, x2 with pt cousin assisting with WC positioning.   Pt performed stand pivot transfer with RW, WC to car simulator with supervision. Education provided how fold walker, and WC for transport.   Pt ascended/descended ramp with mod I.   Pt and cousin deny any questions/concerns.   Pt performed the following therex for B LE strengthening, ROM, and contracture prevention  1x10 single leg bridge  1x10 SLR  1x10 R sidelying L hip extension  1x10 R sidelying L hip abduction   Pt seated EOB with all needs within reach and bed alarm on at end of session.     Therapy Documentation Precautions:  Precautions Precautions: Fall Precaution Comments: wound vac L residual limb Restrictions Weight Bearing Restrictions: Yes LLE Weight Bearing: Non weight bearing  Therapy/Group: Individual Therapy  [facility] Ambrose Finland, [place], DPT  08/14/2023, 7:46 AM

## 2023-08-15 LAB — BASIC METABOLIC PANEL
Anion gap: 8 (ref 5–15)
BUN: 16 mg/dL (ref 6–20)
CO2: 25 mmol/L (ref 22–32)
Calcium: 8.7 mg/dL — ABNORMAL LOW (ref 8.9–10.3)
Chloride: 101 mmol/L (ref 98–111)
Creatinine, Ser: 0.95 mg/dL (ref 0.61–1.24)
GFR, Estimated: >60 mL/min (ref >60)
Glucose, Bld: 154 mg/dL — ABNORMAL HIGH (ref 70–99)
Potassium: 4.5 mmol/L (ref 3.5–5.1)
Sodium: 134 mmol/L — ABNORMAL LOW (ref 135–145)

## 2023-08-15 LAB — CBC
HCT: 26.7 % — ABNORMAL LOW (ref 39.0–52.0)
Hemoglobin: 7.9 g/dL — ABNORMAL LOW (ref 13.0–17.0)
MCH: 21.8 pg — ABNORMAL LOW (ref 26.0–34.0)
MCHC: 29.6 g/dL — ABNORMAL LOW (ref 30.0–36.0)
MCV: 73.8 fL — ABNORMAL LOW (ref 80.0–100.0)
Platelets: 330 10*3/uL (ref 150–400)
RBC: 3.62 MIL/uL — ABNORMAL LOW (ref 4.22–5.81)
RDW: 17.7 % — ABNORMAL HIGH (ref 11.5–15.5)
WBC: 7.9 10*3/uL (ref 4.0–10.5)
nRBC: 0 % (ref 0.0–0.2)

## 2023-08-15 LAB — GLUCOSE, CAPILLARY
Glucose-Capillary: 147 mg/dL — ABNORMAL HIGH (ref 70–99)
Glucose-Capillary: 121 mg/dL — ABNORMAL HIGH (ref 70–99)
Glucose-Capillary: 125 mg/dL — ABNORMAL HIGH (ref 70–99)
Glucose-Capillary: 143 mg/dL — ABNORMAL HIGH (ref 70–99)

## 2023-08-15 LAB — PROTIME-INR
INR: 2.2 — ABNORMAL HIGH (ref 0.8–1.2)
Prothrombin Time: 25 s — ABNORMAL HIGH (ref 11.4–15.2)

## 2023-08-15 NOTE — Progress Notes (Signed)
PHARMACY - ANTICOAGULATION CONSULT NOTE  Pharmacy Consult for warfarin Indication:  hx mechanical AVR  No Known Allergies  Patient Measurements: Height: 5\' 10"  (177.8 cm) Weight: (!) 156.2 kg (344 lb 5.7 oz) IBW/kg (Calculated) : 73  Vital Signs: Temp: 98.7 F (37.1 C) (11/18 1434) BP: 131/71 (11/18 1434) Pulse Rate: 96 (11/18 1434)  Labs: Recent Labs    08/13/23 0457 08/14/23 0504 08/15/23 0605  HGB 7.4* 7.4* 7.9*  HCT 25.3* 26.0* 26.7*  PLT 313 315 330  LABPROT 27.5* 27.0* 25.0*  INR 2.5* 2.5* 2.2*  CREATININE 1.38*  --  0.95    Estimated Creatinine Clearance: 144.5 mL/min (by C-G formula based on SCr of 0.95 mg/dL).  Assessment: 47 yo male admitted for L BKA stump infection. Patient is on warfarin PTA for history of mechanical AVR. Warfarin held x 2 days for BKA, resumed 10/31.  INR remains therapeutic at 2.2. Had trended down to 2.3 on 11/15, and rechecked over the weekend to be follow trend. Some fluctuation. Hemoglobin low stable, PRBCs given on 11/7.  Has been on warfarin 10 mg daily since 11/10.  FOB+ this on 11/16 and again 11/17. Continue Warfarin per discussion with M. Street, PA-C.  PTA warfarin regimen: 5 mg MWF, 10 mg TTSS.   Goal of Therapy:  INR 2-3 Monitor platelets by anticoagulation protocol: Yes   Plan:  Continue warfarin 10 mg daily. PT/INR MWF.  Follow up FOB checks  Dennie Fetters, RPh 08/15/2023,3:02 PM

## 2023-08-15 NOTE — Progress Notes (Signed)
PROGRESS NOTE   Subjective/Complaints:  No acute events overnight noted.  He is working with therapy this morning.  Back pain that was similar to prior kidney stone pain has resolved.  He denies any dysuria.  He continues to have pain at his residual limb, controlled with current regimen.  ROS: Patient denies fever, chills, abdominal pain, n/v shortness of breath, chest pain, new motor or sensory changes, lightheadedness/dizziness.  Denies dysuria +phantom pain-improved +back pain- resolved  Objective:   No results found. Recent Labs    08/14/23 0504 08/15/23 0605  WBC 6.9 7.9  HGB 7.4* 7.9*  HCT 26.0* 26.7*  PLT 315 330   Recent Labs    08/13/23 0457 08/15/23 0605  NA 136 134*  K 4.2 4.5  CL 103 101  CO2 25 25  GLUCOSE 115* 154*  BUN 19 16  CREATININE 1.38* 0.95  CALCIUM 8.7* 8.7*    Intake/Output Summary (Last 24 hours) at 08/15/2023 1153 Last data filed at 08/15/2023 0600 Gross per 24 hour  Intake 952 ml  Output 1600 ml  Net -648 ml        Physical Exam: Vital Signs Blood pressure 109/66, pulse 80, temperature 97.9 F (36.6 C), resp. rate 16, height 5\' 10"  (1.778 m), weight (!) 156.2 kg, SpO2 98%.   General: No acute distress, sitting at the edge of bed with working with therapy HEENT: Head is normocephalic, atraumatic, MMM Heart: Reg rate and rhythm. Mechanical heart valve sound Chest: CTA bilaterally without wheezes, rales, or rhonchi; no distress Abdomen: Soft, non-tender, nondistended, bowel sounds positive Extremities:  L AKA Psych: Cooperative, pleasant Skin: Clean and intact without signs of breakdown, L AKA with shrinker in place venous stasis changes on RLE from mid calf downwards- RLE without any edema today  Neuro:  Mental Status: He is alert and oriented to person, place, and time. Mental status is at baseline.     Comments: Decreased to light touch in RLE from knee downwards -  chronic Musculoskeletal:   Cervical back: Neck supple.  No flank, lower back, or thoracic spine area TTP noted today.    Comments: Ues 5/5 in biceps, triceps, WE, grip and FA B/L RLE- 5-/5 in HF, KE, DF and PF LLE- 4- /5 in HF L AKA -dry dressing in place wearing shrinker   Assessment/Plan: 1. Functional deficits which require 3+ hours per day of interdisciplinary therapy in a comprehensive inpatient rehab setting. Physiatrist is providing close team supervision and 24 hour management of active medical problems listed below. Physiatrist and rehab team continue to assess barriers to discharge/monitor patient progress toward functional and medical goals  Care Tool:  Bathing    Body parts bathed by patient: Right arm, Left arm, Chest, Abdomen, Front perineal area, Buttocks, Right upper leg, Left upper leg, Right lower leg, Face   Body parts bathed by helper: Buttocks, Right lower leg, Left upper leg Body parts n/a: Left lower leg   Bathing assist Assist Level: Set up assist     Upper Body Dressing/Undressing Upper body dressing   What is the patient wearing?: Pull over shirt    Upper body assist Assist Level: Set up assist  Lower Body Dressing/Undressing Lower body dressing      What is the patient wearing?: Pants, Underwear/pull up     Lower body assist Assist for lower body dressing: Supervision/Verbal cueing     Toileting Toileting Toileting Activity did not occur (Clothing management and hygiene only): N/A (no void or bm)  Toileting assist Assist for toileting: Moderate Assistance - Patient 50 - 74%     Transfers Chair/bed transfer  Transfers assist  Chair/bed transfer activity did not occur: Safety/medical concerns  Chair/bed transfer assist level: Supervision/Verbal cueing     Locomotion Ambulation   Ambulation assist   Ambulation activity did not occur: Safety/medical concerns  Assist level: Contact Guard/Touching assist Assistive device:  Walker-rolling Max distance: 10   Walk 10 feet activity   Assist  Walk 10 feet activity did not occur: Safety/medical concerns        Walk 50 feet activity   Assist Walk 50 feet with 2 turns activity did not occur: Safety/medical concerns         Walk 150 feet activity   Assist Walk 150 feet activity did not occur: Safety/medical concerns         Walk 10 feet on uneven surface  activity   Assist Walk 10 feet on uneven surfaces activity did not occur: Safety/medical concerns         Wheelchair     Assist Is the patient using a wheelchair?: Yes Type of Wheelchair: Manual Wheelchair activity did not occur: Safety/medical concerns  Wheelchair assist level: Independent Max wheelchair distance: 150    Wheelchair 50 feet with 2 turns activity    Assist    Wheelchair 50 feet with 2 turns activity did not occur: Safety/medical concerns   Assist Level: Independent   Wheelchair 150 feet activity     Assist  Wheelchair 150 feet activity did not occur: Safety/medical concerns   Assist Level: Independent   Blood pressure 109/66, pulse 80, temperature 97.9 F (36.6 C), resp. rate 16, height 5\' 10"  (1.778 m), weight (!) 156.2 kg, SpO2 98%.  Medical Problem List and Plan: 1. 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  Antithrombotics: -DVT/anticoagulation:  Pharmaceutical: Lovenox 150 mg BID until Coumadin/INR therapeutic>> now on coumadin only -11/11 INR therapeutic level             -antiplatelet therapy: n/a 3. Pain Management: chronic pain and neuropathy-  Con't tylenol prn, Oxycodone 5-15 mg q4 hours prn and gabapentin  scheduled- as well as Robaxin prn  -11/4 increase gabapentin to 600 mg 3  times daily from twice daily 11/8 increase gabapentin to 800 mg 3 times daily for phantom pain , mirror therapy 11/11 pain controlled overall, continue current regimen for now.  Consider decreasing oxycodone dose later this week 11/13 pain controlled overall, continue current regimen. Pt reports back pain similar to kidneys stone pain yesterday- this has resolved, continue to monitor   11/14 oxycodone dose adjusted to 5 to 10 mg  4. Mood/Behavior/Sleep/Anxiety/Depression:              -Continue sertraline 50mg  daily and Gabapentin 600mg  BID  Also team milieu to help with mood and anxiety -antipsychotic agents: n/a  5. Neuropsych/cognition: This patient is capable of making decisions on his own behalf. 6. Skin/Wound Care: has VAC -for 7 days from 07/27/23  -Continue vitamins -11/5 wound VAC was removed yesterday -- 11/14 discussed wound and drainage with Dr. Lajoyce Corners yesterday, Dr. Huel Cote was able to look at the wound image.  Dr. Audrie Lia drainage expected due to increased volume adipose tissue -11/15 reported to be decreasing last 2 days   7. Fluids/Electrolytes/Nutrition/chronic hyponatremia: current Na 132- will recheck in AM             -Continue vitamins and supplements  -11/11 sodium overall stable at 133   -11/13 Na stable at 132 -08/13/23 Na 136, Cr a little up at 1.38 but likely from ?passed kidney stone; monitor  8. Diabetes type 2 with polyneuropathy, poorly controlled, Hgb A1C 10.2: SSI, increased semglee to 35U on d/c from acute, metformin to be resumed upon admission to CIR 1000 mg (home dose) BID-AC  -11/5 Increase semglee to 38 u daily as CBGs have been a little elevated -11/6 monitor response to medication change today, CBGs a little better today  -11/11 will change from regular metformin to metformin XR 1000 mg  -11/15-18 controlled continue current regimen CBG (last 3)  Recent Labs    08/14/23 1612 08/14/23 2035 08/15/23 0626  GLUCAP 136* 153* 147*    9.  Acute on chronic anemia: Hgb 7.4 after 1U PRBCs on 11/1, transfuse if <7 -11/4 patient was noted to have some slight bleeding around his backside.  Overall hemoglobin stable at 7.9 today.  -11/5 bleeding appears to be improved, recheck CBC Thursday -11/7 Transfuse 1 U PRBC due to anemia with hemoglobin 6.6.  Discussed with pharmacy, pharmacy will stop Lovenox bridging since INR likely to be therapeutic in the morning. -11/8 recheck with Hgb 7.0 this morning, will recheck again today- 7.1,FOBT pending -11/11 Hgb stable at 7.6 today -11/13 HGB 7.1, recheck tomorrow , asked nursing to check FOBT -11/15 hemoglobin stable at 7.5 yesterday, recheck tomorrow -08/13/23 Hgb stable 7.4; FOBT+ today-- will recheck CBC in the AM, and another FOBT with next BM; pt stable for now, will hold off calling GI yet.  -08/14/23 Hgb again 7.4, stable, pt without symptoms and VSS, no further BMs tested; likely would need outpatient GI f/up for FOBT+ stools and anemia, but doubt need for emergent consultation.  -11/18 hemoglobin up to 7.9, patient did have positive FOBT, recheck with next BM   10. HTN: continue metoprolol 25mg  BID and lisinopril 20mg  daily  -11/7 BP a little soft at times, decrease lisinopril to 10 mg daily  -11/8 will stop lisinopril for now  -08/06/23 BPs soft but stable, cont regimen for now.   -11/18 well-controlled Vitals:   08/11/23 1955 08/12/23 0553 08/12/23 1536 08/12/23 1926  BP: 130/73 136/75 (!) 115/56 (!) 122/58   08/13/23 0513 08/13/23 1339 08/13/23 1957 08/14/23 0543  BP: 114/62 109/66 116/67 (!) 116/53   08/14/23 1255 08/14/23 1933 08/15/23 0440 08/15/23 0756  BP: 136/70 120/73 117/64 109/66     11. HLD: continue zetia 10mg  daily 12. CAD and mechanical aortic valve: on coumadin resumed on 07/28/23, pharmacy following, bridging with lovenox>> now off lovenox 13. GERD: continue protonix 40mg  daily 14. Morbid obesity: provide dietary education 15. Thrombocytosis: likely reactive,  recheck tomorrow  -Improved to 371 on 11/8  -11/14 stable at 306--313 on 08/13/23--315 08/14/23 16. MRSA positive nasal swab: mupirocin BID 17.  L BKA stump infection- done with IV ABX since amputation removed infection. .  18. Microscopic hematuria- has nonobstructive renal stones- monitor clinically.  19.  Constipation.  Improved after bowel movements on 11/6 and 11/7  -11/8 will start MiraLAX daily to try to prevent further constipation -11/15 last BM 11/12, KUB should help determine stool burden, Senokot 2 tabs at bedtime, MiraLAX scheduled daily  -11/18 patient reports last BM today, did not see this documented yet 20. Azotemia  -Will stop lisinopril and monitor -08/13/23 Cr 1.38, likely from recently passed kidney stone; will recheck Monday.  -11/18 creatinine down to 0.95, BUN 16, stable 21.  Nephrolithiasis -Patient had a abdominal CT on 06/28/2023 finding nonobstructing left renal stones -11/15 will recheck KUB and ultrasound bladder kidneys, check UA, Check BMP and CBC tomorrow am. Continue above medications for pain control Addendum- U/A with pyuria but no nitrates. Suspect most likely related to renal stone not UTI - will check urine culture -08/13/23 flank pain resolved, suspect passed kidney stone, renal US fairly unremarkable but sounds like maybe stone had passed. UCx not done yet, but urinary symptoms fully resolved; monitor -11/18 patient reports symptoms have resolved, continue to monitor for now  LOS: 15 days A FACE TO FACE EVALUATION WAS PERFORMED  Fanny Dance 08/15/2023, 11:53 AM

## 2023-08-15 NOTE — Progress Notes (Signed)
Physical Therapy Session Note  Patient Details  Name: Adam Long MRN: [id number] Date of Birth: 03-19-1976  Today's Date: 08/15/2023 PT Individual Time: 1018 -1119    PT total Time: 61 min   Short Term Goals: Week 2:  PT Short Term Goal 1 (Week 2): pt will ambulate 10 feet with LRAD PT Short Term Goal 2 (Week 2): Pt will perform squat pivot transfer with min A or less consistently PT Short Term Goal 3 (Week 2): pt will tolerate donning grey shrinker on L LE  Skilled Therapeutic Interventions/Progress Updates:      Pt seated in WC upon arrival. Pt agreeable to therapy. Pt denies any pain.   Pt transported dependnet in WC to first floor. Treatment session focused on navigating WC in narrow spaces, and community integration.   Pt performed stand pivot transfer WC to hospital bed, bed to [facility] with use of bed rail and use of WC arm rest with mod I. Pt able to provide teach back of technique without cues from therapist.   Pt self propelled WC throughout gift shop with supervision, verbal cues provided for use of R LE to assist with steering as needed in tight spaces, pt demos improved technique with practice. Pt self propelled WC throughout [employer]era, to place food order, education provided for positioning WC to side of counter for improved ease and safety.  Pt self propelled WC outside, down ramp. Pt very appreciate of going outside. Pt reports his mental health and outlook have improved significantly since he first got here. Therapist offered continued support and encouragement and discussed pt overall progress.   Pt reports he has ordered the bed rails for home. Dicussed pressure relief, pt able to recall need to pressure relief every 30 min in WC and every 2 hours in bed, pt able to provide teach back of techniques for pressure relief: standing with RW, WC push up. Pt performed WC push up x10 holding for ~10 seconds with each rep.   Pt self propelled WC from midwest to west with B UE for  strengthening.   Pt reports new shrinkers delivered to room, therpaist verified correct size. Pt doffed current shrinker with supervision, verbal cues provided for technique. Nurse present to change dressing, with therapist and nurse providing education for sequencing on application, pt able to recall need to use saline solution to clean incision, but unable to recall sequencing of non adherent pad, then abdominal pad, then tape.  Pt able to donn shrinker with mod A, pt demos improved recall of technique and importance of reducing wrinkles to prevent tourniquette.   Pt supine in bed at end of session with all needs within reach and bed alarm on.    Therapy Documentation Precautions:  Precautions Precautions: Fall Precaution Comments: wound vac L residual limb Restrictions Weight Bearing Restrictions: Yes LLE Weight Bearing: Non weight bearing   Therapy/Group: Individual Therapy  [facility] Ambrose Finland, [place], DPT  08/15/2023, 7:47 AM

## 2023-08-15 NOTE — Progress Notes (Signed)
Occupational Therapy Session Note  Patient Details  Name: Adam Long MRN: [id number] Date of Birth: [birth year]  Today's Date: 08/15/2023 OT Individual Time: 1335-1430 OT Individual Time Calculation (min): 55 min    Short Term Goals: Week 2:  OT Short Term Goal 1 (Week 2): STGs=LTGs due to patient's length of stay.  Skilled Therapeutic Interventions/Progress Updates:  Pt greeted resting in bed for skilled OT session with focus on general conditioning.   Pain: Pt reported 5/10 residual limb pain, OT offering intermediate rest breaks and positioning suggestions throughout session to address pain/fatigue and maximize participation/safety in session.   Functional Transfers: Pt performs stand-pivot transfer from EOB<>WC with supervision. Pt able to ascend/descend ramp with BUE, demo safe technique. Pt propels greater than household level distance to all therapy locations with Mod I.   Therapeutic Exercise: Lateral Raises Shoulder Press Shoulder Flexion   Pt completes 3 x 10 reps of each exercise with verbal cuing for correct technique, all to target musculature required for functional transfers.   Self Care: Pt and OT review access to community (I.e doctor's appointments), discussion focused on need for +2 assistance to manage equipment including RW/WC.   Pt remained resting in bed with 4Ps assessed and immediate needs met. Pt continues to be appropriate for skilled OT intervention to promote further functional independence in ADLs/IADLs.   Therapy Documentation Precautions:  Precautions Precautions: Fall Precaution Comments: wound vac L residual limb Restrictions Weight Bearing Restrictions: Yes LLE Weight Bearing: Non weight bearing   Therapy/Group: Individual Therapy  Lou Cal, OTR/L, MSOT  08/15/2023, 6:18 AM

## 2023-08-15 NOTE — Progress Notes (Signed)
Occupational Therapy Session Note  Patient Details  Name: Adam Long MRN: [id number] Date of Birth: 1975/12/22  Today's Date: 08/15/2023 OT Individual Time: 0855-1005 OT Individual Time Calculation (min): 70 min    Short Term Goals: Week 2:  OT Short Term Goal 1 (Week 2): STGs=LTGs due to patient's length of stay.  Skilled Therapeutic Interventions/Progress Updates:  Pt greeted supine in bed, pt agreeable to OT intervention.    Pt requested to don fresh clothes this AM  Transfers/bed mobility: pt completed supine>sit with use of bed features with CGA for + safety. Pt completed sit>stands from EOB during dressing tasks with supervision. Pt completed stand pivot with RW and supervision.    Therapeutic activity: worked on dynamic standing with pt able to stand with unilateral support to complete leggo structure, pt stood for 2 mins to complete task with CGA.    ADLs:  UB dressing:pt donned OH shirt with set- up assist.  LB dressing: pt donned pants from EOB with supervision  Footwear: donned shoe with set- up assist.   Bathing: pt completed wash up at sink with supervision.  Transfers: pt completed stand pivot to w/c with RW and CGA.    Exercises: education provided on below exercises for HEP, pt completed therex with level 4 theraband:   X10 shoulder flexion  X10 bicep curls X10 shoulder horizontal ABD X10 shoulder diagonal pulls X10 shoulder extension  X10 alternating punches X10 bilateral shoulder external rotation   Issued pt written HEP to increase carryover  Ended session with pt seated in w/c with all needs within reach.             Therapy Documentation Precautions:  Precautions Precautions: Fall Precaution Comments: wound vac L residual limb Restrictions Weight Bearing Restrictions: Yes LLE Weight Bearing: Non weight bearing  Pain: no pain reported during session     Therapy/Group: Individual Therapy  Pollyann Glen [facility] 08/15/2023, 12:08 PM

## 2023-08-16 ENCOUNTER — Other Ambulatory Visit (HOSPITAL_COMMUNITY): Payer: Self-pay

## 2023-08-16 LAB — GLUCOSE, CAPILLARY
Glucose-Capillary: 111 mg/dL — ABNORMAL HIGH (ref 70–99)
Glucose-Capillary: 96 mg/dL (ref 70–99)
Glucose-Capillary: 151 mg/dL — ABNORMAL HIGH (ref 70–99)
Glucose-Capillary: 138 mg/dL — ABNORMAL HIGH (ref 70–99)

## 2023-08-16 MED ORDER — METOPROLOL TARTRATE 25 MG PO TABS
25.0000 mg | ORAL_TABLET | Freq: Two times a day (BID) | ORAL | 0 refills | Status: DC
  Filled 2023-08-16: qty 60, 30d supply, fill #0

## 2023-08-16 MED ORDER — OXYCODONE HCL 5 MG PO TABS: 5.0000 mg | ORAL_TABLET | Freq: Four times a day (QID) | ORAL | Status: DC | PRN

## 2023-08-16 MED ORDER — EZETIMIBE 10 MG PO TABS
10.0000 mg | ORAL_TABLET | Freq: Every day | ORAL | 0 refills | Status: DC
  Filled 2023-08-16 – 2023-09-13 (×2): qty 30, 30d supply, fill #0

## 2023-08-16 MED ORDER — WARFARIN SODIUM 2.5 MG PO TABS: 5.0000 mg | ORAL_TABLET | Freq: Every day | ORAL | Status: DC

## 2023-08-16 MED ORDER — METFORMIN HCL ER 500 MG PO TB24
1000.0000 mg | ORAL_TABLET | Freq: Two times a day (BID) | ORAL | 0 refills | Status: DC
  Filled 2023-08-16: qty 120, 30d supply, fill #0

## 2023-08-16 MED ORDER — MEDIHONEY WOUND/BURN DRESSING EX PSTE
PASTE | CUTANEOUS | 0 refills | Status: AC
  Filled 2023-08-16 – 2024-01-16 (×9): qty 44, fill #0

## 2023-08-16 MED ORDER — ASCORBIC ACID 1000 MG PO TABS
1000.0000 mg | ORAL_TABLET | Freq: Every day | ORAL | 0 refills | Status: AC
  Filled 2023-08-16: qty 30, 30d supply, fill #0

## 2023-08-16 MED ORDER — POLYETHYLENE GLYCOL 3350 17 G PO PACK: 17.0000 g | PACK | Freq: Every day | ORAL | Status: DC

## 2023-08-16 MED ORDER — BD PEN NEEDLE NANO U/F 32G X 4 MM MISC
Freq: Every day | 0 refills | Status: DC
  Filled 2023-08-16: qty 100, fill #0
  Filled 2023-11-20: qty 100, 100d supply, fill #0

## 2023-08-16 MED ORDER — OXYCODONE HCL 5 MG PO TABS
5.0000 mg | ORAL_TABLET | ORAL | 0 refills | Status: DC | PRN
  Filled 2023-08-16: qty 30, 5d supply, fill #0

## 2023-08-16 MED ORDER — METHOCARBAMOL 500 MG PO TABS
500.0000 mg | ORAL_TABLET | Freq: Four times a day (QID) | ORAL | 0 refills | Status: DC | PRN
  Filled 2023-08-16: qty 60, 15d supply, fill #0

## 2023-08-16 MED ORDER — OXYCODONE HCL 5 MG PO TABS
5.0000 mg | ORAL_TABLET | Freq: Four times a day (QID) | ORAL | 0 refills | Status: DC | PRN
  Filled 2023-08-16: qty 30, 5d supply, fill #0

## 2023-08-16 MED ORDER — WARFARIN SODIUM 5 MG PO TABS
ORAL_TABLET | ORAL | 0 refills | Status: DC
  Filled 2023-08-16: qty 180, fill #0

## 2023-08-16 MED ORDER — BD PEN NEEDLE NANO U/F 32G X 4 MM MISC
Freq: Every day | 0 refills | Status: DC
  Filled 2023-08-16: qty 100, 100d supply, fill #0

## 2023-08-16 MED ORDER — LANTUS SOLOSTAR 100 UNIT/ML ~~LOC~~ SOPN
38.0000 [IU] | PEN_INJECTOR | Freq: Every day | SUBCUTANEOUS | 11 refills | Status: DC
  Filled 2023-08-16 – 2023-10-18 (×2): qty 15, 39d supply, fill #0
  Filled 2023-12-12: qty 15, 39d supply, fill #1
  Filled 2024-01-16: qty 15, 39d supply, fill #2

## 2023-08-16 MED ORDER — GABAPENTIN 400 MG PO CAPS
800.0000 mg | ORAL_CAPSULE | Freq: Three times a day (TID) | ORAL | 0 refills | Status: DC
  Filled 2023-08-16: qty 180, 30d supply, fill #0

## 2023-08-16 MED ORDER — OXYCODONE HCL 5 MG PO TABS
10.0000 mg | ORAL_TABLET | Freq: Four times a day (QID) | ORAL | Status: DC | PRN
  Administered 2023-08-16 – 2023-08-17 (×3): 10 mg via ORAL
  Filled 2023-08-16 (×3): qty 2

## 2023-08-16 MED ORDER — ZINC SULFATE 220 (50 ZN) MG PO TABS
220.0000 mg | ORAL_TABLET | Freq: Every day | ORAL | 0 refills | Status: AC
  Filled 2023-08-16: qty 30, 30d supply, fill #0

## 2023-08-16 MED ORDER — SERTRALINE HCL 50 MG PO TABS
50.0000 mg | ORAL_TABLET | Freq: Every day | ORAL | 0 refills | Status: DC
  Filled 2023-08-16: qty 30, 30d supply, fill #0

## 2023-08-16 MED ORDER — PANTOPRAZOLE SODIUM 40 MG PO TBEC
40.0000 mg | DELAYED_RELEASE_TABLET | Freq: Every day | ORAL | 0 refills | Status: DC
  Filled 2023-08-16: qty 30, 30d supply, fill #0

## 2023-08-16 NOTE — Progress Notes (Incomplete)
Inpatient Rehabilitation Discharge Medication Review by a Pharmacist  A complete drug regimen review was completed for this patient to identify any potential clinically significant medication issues.  High Risk Drug Classes Is patient taking? Indication by Medication  Antipsychotic No   Anticoagulant Yes Warfarin - hx AVR  Antibiotic No   Opioid Yes PRN Oxycodone - moderate pain  Antiplatelet No   Hypoglycemics/insulin Yes Insulin glargine, Metformin XR - DM Type 2  Vasoactive Medication Yes Metoprolol - hypertension  Chemotherapy No   Other Yes Ezetemibe - hyperlipidemia Gabapentin - neuropathic pain Pantoprazole - GERD Sertraline - mood Vitamin C, Zinc - supplements Miralax - laxative  PRNs: Methocarbamol - muscle spasms     Type of Medication Issue Identified Description of Issue Recommendation(s)  Drug Interaction(s) (clinically significant)     Duplicate Therapy     Allergy     No Medication Administration End Date     Incorrect Dose     Additional Drug Therapy Needed     Significant med changes from prior encounter (inform family/care partners about these prior to discharge). Warfarin regimen has changed. Enoxaparin discontinued with therapeutic INR. Lisinopril discontinued due to low BP. Outpatient PT/INR monitoring.  Communicate changes with patient/family prior to discharge.  Other       Clinically significant medication issues were identified that warrant physician communication and completion of prescribed/recommended actions by midnight of the next day:  No  Name of provider notified for urgent issues identified:   Provider Method of Notification:    Pharmacist comments:   - FOB + stool on 11/16.  Planning to follow up further BMs for recurrence.  Time spent performing this drug regimen review (minutes):  20   Dennie Fetters, [place] 08/16/2023 5:28 PM

## 2023-08-16 NOTE — Progress Notes (Signed)
PROGRESS NOTE   Subjective/Complaints:  No acute events noted overnight.  Reports pain is overall controlled with current regimen.  ROS: Patient denies fever, chills, abdominal pain, n/v shortness of breath, chest pain, new motor or sensory changes, lightheadedness/dizziness.  Denies dysuria, denies headache  +phantom pain-improved +back pain- resolved  Objective:   No results found. Recent Labs    08/14/23 0504 08/15/23 0605  WBC 6.9 7.9  HGB 7.4* 7.9*  HCT 26.0* 26.7*  PLT 315 330   Recent Labs    08/15/23 0605  NA 134*  K 4.5  CL 101  CO2 25  GLUCOSE 154*  BUN 16  CREATININE 0.95  CALCIUM 8.7*    Intake/Output Summary (Last 24 hours) at 08/16/2023 1323 Last data filed at 08/16/2023 1224 Gross per 24 hour  Intake 708 ml  Output 2570 ml  Net -1862 ml        Physical Exam: Vital Signs Blood pressure 116/70, pulse 77, temperature 98.1 F (36.7 C), resp. rate 16, height 5\' 10"  (1.778 m), weight (!) 156.2 kg, SpO2 100%.   General: No acute distress HEENT: Head is normocephalic, atraumatic, MMM Heart: Reg rate and rhythm. Mechanical heart valve sound Chest: CTA bilaterally without wheezes, rales, or rhonchi; no distress Abdomen: Soft, non-tender, nondistended, bowel sounds positive Extremities:  L AKA Psych: Cooperative, pleasant Skin: Clean and intact without signs of breakdown, L AKA with shrinker in place venous stasis changes on RLE from mid calf downwards  Neuro:  Mental Status: He is alert and oriented to person, place, and time. Mental status is at baseline.  Follows commands.    Comments: Decreased to light touch in RLE from knee downwards - chronic Musculoskeletal:   Cervical back: Neck supple.  No lower back tenderness.    Comments: Ues 5/5 in biceps, triceps, WE, grip and FA B/L RLE- 5-/5 in HF, KE, DF and PF LLE- 4 /5 in HF L AKA -dry dressing in place wearing  shrinker   Assessment/Plan: 1. Functional deficits which require 3+ hours per day of interdisciplinary therapy in a comprehensive inpatient rehab setting. Physiatrist is providing close team supervision and 24 hour management of active medical problems listed below. Physiatrist and rehab team continue to assess barriers to discharge/monitor patient progress toward functional and medical goals  Care Tool:  Bathing    Body parts bathed by patient: Right arm, Left arm, Chest, Abdomen, Front perineal area, Buttocks, Right upper leg, Left upper leg, Right lower leg, Face   Body parts bathed by helper: Buttocks, Right lower leg, Left upper leg Body parts n/a: Left lower leg   Bathing assist Assist Level: Set up assist     Upper Body Dressing/Undressing Upper body dressing   What is the patient wearing?: Pull over shirt    Upper body assist Assist Level: Set up assist    Lower Body Dressing/Undressing Lower body dressing      What is the patient wearing?: Pants, Underwear/pull up     Lower body assist Assist for lower body dressing: Supervision/Verbal cueing     Toileting Toileting Toileting Activity did not occur (Clothing management and hygiene only): N/A (no void or bm)  Toileting assist  Assist for toileting: Moderate Assistance - Patient 50 - 74%     Transfers Chair/bed transfer  Transfers assist  Chair/bed transfer activity did not occur: Safety/medical concerns  Chair/bed transfer assist level: Supervision/Verbal cueing     Locomotion Ambulation   Ambulation assist   Ambulation activity did not occur: Safety/medical concerns  Assist level: Contact Guard/Touching assist Assistive device: Walker-rolling Max distance: 10   Walk 10 feet activity   Assist  Walk 10 feet activity did not occur: Safety/medical concerns        Walk 50 feet activity   Assist Walk 50 feet with 2 turns activity did not occur: Safety/medical concerns         Walk 150  feet activity   Assist Walk 150 feet activity did not occur: Safety/medical concerns         Walk 10 feet on uneven surface  activity   Assist Walk 10 feet on uneven surfaces activity did not occur: Safety/medical concerns         Wheelchair     Assist Is the patient using a wheelchair?: Yes Type of Wheelchair: Manual Wheelchair activity did not occur: Safety/medical concerns  Wheelchair assist level: Independent Max wheelchair distance: 150    Wheelchair 50 feet with 2 turns activity    Assist    Wheelchair 50 feet with 2 turns activity did not occur: Safety/medical concerns   Assist Level: Independent   Wheelchair 150 feet activity     Assist  Wheelchair 150 feet activity did not occur: Safety/medical concerns   Assist Level: Independent   Blood pressure 116/70, pulse 77, temperature 98.1 F (36.7 C), resp. rate 16, height 5\' 10"  (1.778 m), weight (!) 156.2 kg, SpO2 100%.  Medical Problem List and Plan: 1. 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  Antithrombotics: -DVT/anticoagulation:  Pharmaceutical: Lovenox 150 mg BID until Coumadin/INR therapeutic>> now on coumadin only -11/11 INR therapeutic level             -antiplatelet therapy: n/a 3. Pain Management: chronic pain and neuropathy-  Con't tylenol prn, Oxycodone 5-15 mg q4 hours prn and gabapentin  scheduled- as well as Robaxin prn  -11/4 increase gabapentin to 600 mg 3 times daily from twice daily 11/8 increase gabapentin to 800 mg 3 times daily for phantom pain , mirror therapy 11/11 pain controlled overall, continue current regimen for now.  Consider decreasing oxycodone dose later this week 11/13 pain controlled overall,  continue current regimen. Pt reports back pain similar to kidneys stone pain yesterday- this has resolved, continue to monitor   11/14 oxycodone dose adjusted to 5 to 10 mg  11/19 will decrease oxycodone as needed frequency to every 6 hours  4. Mood/Behavior/Sleep/Anxiety/Depression:              -Continue sertraline 50mg  daily and Gabapentin 600mg  BID             Also team milieu to help with mood and anxiety -antipsychotic agents: n/a  5. Neuropsych/cognition: This patient is capable of making decisions on his own behalf. 6. Skin/Wound Care: has VAC -for 7 days from 07/27/23  -Continue  vitamins -11/5 wound VAC was removed yesterday -- 11/14 discussed wound and drainage with Dr. Lajoyce Corners yesterday, Dr. Huel Cote was able to look at the wound image.  Dr. Audrie Lia drainage expected due to increased volume adipose tissue   7. Fluids/Electrolytes/Nutrition/chronic hyponatremia: current Na 132- will recheck in AM             -Continue vitamins and supplements  -11/11 sodium overall stable at 133   -11/13 Na stable at 132 -08/13/23 Na 136, Cr a little up at 1.38 but likely from ?passed kidney stone; monitor -11/18 creatinine improved to 0.95  8. Diabetes type 2 with polyneuropathy, poorly controlled, Hgb A1C 10.2: SSI, increased semglee to 35U on d/c from acute, metformin to be resumed upon admission to CIR 1000 mg (home dose) BID-AC  -11/5 Increase semglee to 38 u daily as CBGs have been a little elevated -11/6 monitor response to medication change today, CBGs a little better today  -11/11 will change from regular metformin to metformin XR 1000 mg  -11/19 CBGs controlled overall, continue current regimen CBG (last 3)  Recent Labs    08/15/23 2034 08/16/23 0618 08/16/23 1152  GLUCAP 143* 111* 96    9. Acute on chronic anemia: Hgb 7.4 after 1U PRBCs on 11/1, transfuse if <7 -11/4 patient was noted to have some slight bleeding around his backside.  Overall hemoglobin stable at 7.9  today.  -11/5 bleeding appears to be improved, recheck CBC Thursday -11/7 Transfuse 1 U PRBC due to anemia with hemoglobin 6.6.  Discussed with pharmacy, pharmacy will stop Lovenox bridging since INR likely to be therapeutic in the morning. -11/8 recheck with Hgb 7.0 this morning, will recheck again today- 7.1,FOBT pending -11/11 Hgb stable at 7.6 today -11/13 HGB 7.1, recheck tomorrow , asked nursing to check FOBT -11/15 hemoglobin stable at 7.5 yesterday, recheck tomorrow -08/13/23 Hgb stable 7.4; FOBT+ today-- will recheck CBC in the AM, and another FOBT with next BM; pt stable for now, will hold off calling GI yet.  -08/14/23 Hgb again 7.4, stable, pt without symptoms and VSS, no further BMs tested; likely would need outpatient GI f/up for FOBT+ stools and anemia, but doubt need for emergent consultation.  -11/18 hemoglobin up to 7.9, patient did have positive FOBT, recheck with next [facility] 11/19 advise rechecking outpatient with PCP, asked nursing to check FOBT if he has BM   10. HTN: continue metoprolol 25mg  BID and lisinopril 20mg  daily  -11/7 BP a little soft at times, decrease lisinopril to 10 mg daily  -11/8 will stop lisinopril for now  -08/06/23 BPs soft but stable, cont regimen for now.   -11/19 well controlled Vitals:   08/12/23 1926 08/13/23 0513 08/13/23 1339 08/13/23 1957  BP: (!) 122/58 114/62 109/66 116/67   08/14/23 0543 08/14/23 1255 08/14/23 1933 08/15/23 0440  BP: (!) 116/53 136/70 120/73 117/64   08/15/23 0756 08/15/23 1434 08/15/23 1945 08/16/23 0450  BP: 109/66 131/71 (!) 105/90 116/70     11. HLD: continue zetia 10mg  daily 12. CAD and mechanical aortic valve: on coumadin resumed on 07/28/23, pharmacy following, bridging with lovenox>> now off lovenox 13. GERD: continue protonix 40mg  daily 14. Morbid obesity: provide dietary education 15. Thrombocytosis: likely reactive, recheck tomorrow  -Improved to 371 on 11/8  -11/14 stable at 306--313 on 08/13/23--315  08/14/23 16. MRSA positive nasal swab: mupirocin BID 17. L BKA stump infection- done with IV ABX since amputation removed infection. .  18. Microscopic hematuria- has nonobstructive renal stones- monitor clinically.  19.  Constipation.  Improved after bowel movements on 11/6 and 11/7  -11/8 will start MiraLAX daily to try to prevent further constipation -11/15 last BM 11/12, KUB should help determine stool burden, Senokot 2 tabs at bedtime, MiraLAX scheduled daily  -11/19 patient reports last BM yesterday 20. Azotemia  -Will stop lisinopril and monitor -08/13/23 Cr 1.38, likely from recently passed kidney stone; will recheck Monday.  -11/18 creatinine down to 0.95, BUN 16, stable 21.  Nephrolithiasis -Patient had a abdominal CT on 06/28/2023 finding nonobstructing left renal stones -11/15 will recheck KUB and ultrasound bladder kidneys, check UA, Check BMP and CBC tomorrow am. Continue above medications for pain control Addendum- U/A with pyuria but no nitrates. Suspect most likely related to renal stone not UTI - will check urine culture -08/13/23 flank pain resolved, suspect passed kidney stone, renal US fairly unremarkable but sounds like maybe stone had passed. UCx not done yet, but urinary symptoms fully resolved; monitor -11/18-9 patient reports symptoms have resolved, continue to monitor for now  LOS: 16 days A FACE TO FACE EVALUATION WAS PERFORMED  Fanny Dance 08/16/2023, 1:23 PM

## 2023-08-16 NOTE — Plan of Care (Signed)
  Problem: RH Bathing Goal: LTG Patient will bathe all body parts with assist levels (OT) Description: LTG: Patient will bathe all body parts with assist levels (OT) Outcome: Completed/Met   Problem: RH Dressing Goal: LTG Patient will perform lower body dressing w/assist (OT) Description: LTG: Patient will perform lower body dressing with assist, with/without cues in positioning using equipment (OT) Outcome: Completed/Met   Problem: RH Toileting Goal: LTG Patient will perform toileting task (3/3 steps) with assistance level (OT) Description: LTG: Patient will perform toileting task (3/3 steps) with assistance level (OT)  Outcome: Completed/Met   Problem: RH Simple Meal Prep Goal: LTG Patient will perform simple meal prep w/assist (OT) Description: LTG: Patient will perform simple meal prep with assistance, with/without cues (OT). Outcome: Completed/Met   Problem: RH Toilet Transfers Goal: LTG Patient will perform toilet transfers w/assist (OT) Description: LTG: Patient will perform toilet transfers with assist, with/without cues using equipment (OT) Outcome: Completed/Met Note: Stand-pivot/Squat-pivot   Problem: RH Tub/Shower Transfers Goal: LTG Patient will perform tub/shower transfers w/assist (OT) Description: LTG: Patient will perform tub/shower transfers with assist, with/without cues using equipment (OT) Outcome: Completed/Met Note: Stand-step + RW

## 2023-08-16 NOTE — Plan of Care (Signed)
  Problem: RH Ambulation Goal: LTG Patient will ambulate in controlled environment (PT) Description: LTG: Patient will ambulate in a controlled environment, # of feet with assistance (PT). Outcome: Adequate for Discharge   Problem: RH Balance Goal: LTG Patient will maintain dynamic sitting balance (PT) Description: LTG:  Patient will maintain dynamic sitting balance with assistance during mobility activities (PT) Outcome: Completed/Met Goal: LTG Patient will maintain dynamic standing balance (PT) Description: LTG:  Patient will maintain dynamic standing balance with assistance during mobility activities (PT) Outcome: Completed/Met   Problem: Sit to Stand Goal: LTG:  Patient will perform sit to stand with assistance level (PT) Description: LTG:  Patient will perform sit to stand with assistance level (PT) Outcome: Completed/Met   Problem: RH Bed Mobility Goal: LTG Patient will perform bed mobility with assist (PT) Description: LTG: Patient will perform bed mobility with assistance, with/without cues (PT). Outcome: Completed/Met   Problem: RH Bed to Chair Transfers Goal: LTG Patient will perform bed/chair transfers w/assist (PT) Description: LTG: Patient will perform bed to chair transfers with assistance (PT). Outcome: Completed/Met   Problem: RH Wheelchair Mobility Goal: LTG Patient will propel w/c in controlled environment (PT) Description: LTG: Patient will propel wheelchair in controlled environment, # of feet with assist (PT) Outcome: Completed/Met   Problem: RH Car Transfers Goal: LTG Patient will perform car transfers with assist (PT) Description: LTG: Patient will perform car transfers with assistance (PT). Outcome: Completed/Met

## 2023-08-16 NOTE — Progress Notes (Signed)
Inpatient Rehabilitation Care Coordinator Discharge Note   Patient Details  Name: Adam Long MRN: [id number] Date of Birth: 05-13-1976   Discharge location: HOME WITH UNCLE WITH COUSIN ASSISTING WHEN NEEDED  Length of Stay: 17 DAYS  Discharge activity level: MOD/I WHEELCHAIR AND CGA WITH CAR AND STEPS  Home/community participation: ACTIVE  Patient response WU:JWJXBJ Literacy - How often do you need to have someone help you when you read instructions, pamphlets, or other written material from your doctor or pharmacy?: Never  Patient response YN:WGNFAO Isolation - How often do you feel lonely or isolated from those around you?: Never  Services provided included: MD, RD, PT, OT, RN, CM, TR, Pharmacy, Neuropsych, SW  Financial Services:  Financial Services Utilized: Medicaid    Choices offered to/list presented to: PT  Follow-up services arranged:  Outpatient, DME, Patient/Family has no preference for HH/DME agencies    Outpatient Servicies: [place] [facility] WILL CALL TO SET UP FOLLOW UP APPOINTMENTS DME : [facility] HAS OTHER EQUIPMENT FROM PAST ADMISSIONS    Patient response to transportation need: Is the patient able to respond to transportation needs?: Yes In the past 12 months, has lack of transportation kept you from medical appointments or from getting medications?: No In the past 12 months, has lack of transportation kept you from meetings, work, or from getting things needed for daily living?: No   Patient/Family verbalized understanding of follow-up arrangements:  Yes  Individual responsible for coordination of the follow-up plan: SELF  AND Adam Long [phone]  Confirmed correct DME delivered: Lucy Chris 08/16/2023    Comments (or additional information):COUSIN CAME IN AND DID EDUCATION AND BOTH FEEL READY FOR PT TO GO HOME.   Summary of Stay    Date/Time Discharge Planning CSW  08/10/23 0856 Pt progressing  well this week and is more motivated-peer person talked with pt Monday. Plan home with uncle will try to get home health for him at DC RGD  08/03/23 1032 Home with uncle who can be there but not physically assist. Pt will need to be mod/i wheelchair level. Barriers pain, coping and dizziness RGD       Tyasia Packard, Lemar Livings

## 2023-08-16 NOTE — Progress Notes (Signed)
Inpatient Rehabilitation Discharge Medication Review by a Pharmacist  A complete drug regimen review was completed for this patient to identify any potential clinically significant medication issues.  High Risk Drug Classes Is patient taking? Indication by Medication  Antipsychotic No   Anticoagulant Yes Warfarin - hx of AVR  Antibiotic No   Opioid Yes Oxycodone prn pain  Antiplatelet No   Hypoglycemics/insulin Yes Metformin, lantus- DM  Vasoactive Medication Yes Metoprolol - HTN  Chemotherapy No   Other Yes Gabapentin - pain Ezetimibe - HLD Methocarbamol prn spasms Pantoprazole - reflux Sertraline - mood     Type of Medication Issue Identified Description of Issue Recommendation(s)  Drug Interaction(s) (clinically significant)     Duplicate Therapy     Allergy     No Medication Administration End Date     Incorrect Dose     Additional Drug Therapy Needed     Significant med changes from prior encounter (inform family/care partners about these prior to discharge).    Other       Clinically significant medication issues were identified that warrant physician communication and completion of prescribed/recommended actions by midnight of the next day:  No  Pharmacist comments: added warfarin to AVS  Time spent performing this drug regimen review (minutes):  20 minutes  Thank you Okey Regal, PharmD

## 2023-08-16 NOTE — Progress Notes (Signed)
Occupational Therapy Discharge Summary  Patient Details  Name: Adam Long MRN: [id number] Date of Birth: 1976-08-08  Date of Discharge from OT service:August 16, 2023  Today's Date: 08/16/2023 OT Individual Time: 9528-4132 OT Individual Time Calculation (min): 72 min    Patient has met 6 of 6 long term goals due to improved activity tolerance, improved balance, postural control, ability to compensate for deficits, and improved awareness.  Patient to discharge at overall Modified Independent level, requiring supervision for higher-level dynamic standing balance activities. Patient's care partner is independent to provide the necessary physical assistance at discharge.    Reasons goals not met: NA  Recommendation:  Patient will benefit from ongoing skilled OT services in home health setting to continue to advance functional skills in the area of BADL, iADL, and Reduce care partner burden.  Equipment: Pt owns Bariatric BSC and TTB  Reasons for discharge: treatment goals met and discharge from hospital  Patient/family agrees with progress made and goals achieved: Yes  OT Discharge Precautions/Restrictions  Precautions Precautions: Fall Precaution Comments: L AKA Required Braces or Orthoses: Other Brace Other Brace: LLE Shinker Restrictions Weight Bearing Restrictions: Yes LLE Weight Bearing: Non weight bearing Pain Pain Assessment Pain Scale: 0-10 Pain Score: 5  Pain Type: Acute pain;Surgical pain Pain Location: Leg Pain Orientation: Left Pain Descriptors / Indicators: Sore Pain Frequency: Constant Pain Onset: On-going Patients Stated Pain Goal: 3 Pain Intervention(s): Medication (See eMAR);Rest ADL ADL Equipment Provided: Reacher, Sock aid, Long-handled sponge Eating: Independent Where Assessed-Eating: Edge of bed, Wheelchair Grooming: Modified independent Where Assessed-Grooming: Sitting at sink Upper Body Bathing: Independent Where Assessed-Upper Body  Bathing: Sitting at sink, Shower Lower Body Bathing: Modified independent Where Assessed-Lower Body Bathing: Bed level, Shower, Sitting at sink Upper Body Dressing: Independent Where Assessed-Upper Body Dressing: Edge of bed, Wheelchair Lower Body Dressing: Modified independent Where Assessed-Lower Body Dressing: Wheelchair, Bed level Toileting: Modified independent Where Assessed-Toileting: Other (Comment), Bedside Commode (Bariatric BSC) Toilet Transfer: Modified independent Toilet Transfer Method: Surveyor, minerals: Extra wide drop arm bedside commode Tub/Shower Transfer: Close supervison Web designer Method: Stand pivot Tub/Shower Equipment: Emergency planning/management officer, Other (comment) (Bariatric TTB) ADL Comments: Pt declines OOB activity at eval, levels of assitance based on performance during Pt evaluation and clinical judgement. Vision Baseline Vision/History: 1 Wears glasses Patient Visual Report: No change from baseline Vision Assessment?: No apparent visual deficits Perception  Perception: Within Functional Limits Praxis Praxis: WFL Cognition Cognition Overall Cognitive Status: Within Functional Limits for tasks assessed Arousal/Alertness: Awake/alert Memory: Appears intact Awareness: Appears intact Problem Solving: Appears intact Safety/Judgment: Appears intact Brief Interview for Mental Status (BIMS) Repetition of Three Words (First Attempt): 3 Temporal Orientation: Year: Correct Temporal Orientation: Month: Accurate within 5 days Temporal Orientation: Day: Correct Recall: "Sock": Yes, no cue required Recall: "Blue": Yes, no cue required Recall: "Bed": Yes, no cue required BIMS Summary Score: 15 Sensation Sensation Light Touch: Impaired by gross assessment Peripheral sensation comments: Deficits due to L AKA Light Touch Impaired Details: Impaired LLE Proprioception: Appears Intact Stereognosis: Not tested Additional Comments: Pt has  intermittent L LE phantom pains. Coordination Gross Motor Movements are Fluid and Coordinated: No Fine Motor Movements are Fluid and Coordinated: Yes Coordination and Movement Description: Limited due to gross weakness and recent amputation revision. Motor  Motor Motor: Within Functional Limits Mobility  Bed Mobility Bed Mobility: Rolling Right;Rolling Left;Sit to Supine;Supine to Sit Rolling Right: Independent with assistive device Rolling Left: Independent with assistive device Supine to Sit: Independent with assistive device  Sit to Supine: Independent with assistive device Transfers Sit to Stand: Independent with assistive device Stand to Sit: Independent with assistive device Stand-Pivot: Setup/Supervision   Trunk/Postural Assessment  Cervical Assessment Cervical Assessment: Within Functional Limits Thoracic Assessment Thoracic Assessment: Within Functional Limits Lumbar Assessment Lumbar Assessment: Within Functional Limits Postural Control Postural Control: Within Functional Limits  Balance Balance Balance Assessed: Yes Static Sitting Balance Static Sitting - Balance Support: Feet supported Static Sitting - Level of Assistance: 7: Independent Dynamic Sitting Balance Dynamic Sitting - Balance Support: Feet supported;During functional activity Dynamic Sitting - Level of Assistance: 6: Modified independent (Device/Increase time) Dynamic Sitting - Balance Activities: Lateral lean/weight shifting;Forward lean/weight shifting;Reaching for Higher education careers adviser Standing - Balance Support: Bilateral upper extremity supported Static Standing - Level of Assistance: 6: Modified independent (Device/Increase time) Dynamic Standing Balance Dynamic Standing - Balance Support: During functional activity Dynamic Standing - Level of Assistance: 5: Stand by assistance (SUP) Dynamic Standing - Balance Activities: Lateral lean/weight shifting;Reaching for objects;Forward  lean/weight shifting Extremity/Trunk Assessment RUE Assessment RUE Assessment: Within Functional Limits LUE Assessment LUE Assessment: Within Functional Limits  Skilled Intervention: Pt greeted sitting in [facility] for skilled OT session with focus on toileting  Pain: Pt reported 5/10 residual limb pain, pre-medicated. OT offering intermediate rest breaks and positioning suggestions throughout session to address pain/fatigue and maximize participation/safety in session.   Functional Transfers: Pt performs stand-pivot from WC<>BSC placed on toilet, overall supervision + cuing for sequencing due to newness of BSC being placed on toilet.  Self Care Tasks: Pt requires A for peri-care in standing (sup + grab bar), pt shares he has purchased toileting aids including toilet tongs & portable bidet.   Therapeutic Activities: In day room, pt instructed in dynamic standing balance activity, as patient was tasked with reaching across midline to retrieve/place cards above head-height. Pt tolerates rounds of ~2 mins with supervision + unilateral support of RW.   Therapeutic Exercise: Pt completes 3x10 reps of knee flexion/extension, calf rasises, and hip flexion/extension at seated level for carryover into morning stretching/strengthening routine for joint mobility prior to functional mobility.    Pt remained sitting in WC with 4Ps assessed and immediate needs met. Pt continues to be appropriate for skilled OT intervention to promote further functional independence in ADLs/IADLs.   Lou Cal, OTR/L, MSOT  08/16/2023, 8:57 AM

## 2023-08-16 NOTE — Progress Notes (Signed)
Physical Therapy Discharge Summary  Patient Details  Name: Adam Long MRN: [id number] Date of Birth: 1976-03-19  Date of Discharge from PT service:August 16, 2023  Today's Date: 08/16/2023 PT Individual Time: 0800-0858, 4098-1191 PT Individual Time Calculation (min): 58 min, 74 min    Patient has met 7 of 8 long term goals due to improved activity tolerance, improved balance, improved postural control, increased strength, increased range of motion, decreased pain, ability to compensate for deficits, improved attention, improved awareness, and improved coordination.  Patient to discharge at a wheelchair level Modified Independent.   Patient's care partner is independent to provide the necessary physical assistance at discharge.   Reasons goals not met: Pt requires CGA for ambulation x10 feet with RW. Adequate for discharge however as therapist not recommending ambulation at home until pt gets prosthetic (to limit unnecessary forces on R LE as pt home is accessible to [facility].) Pt and caregiver agreeable.   Recommendation:  Patient will benefit from ongoing skilled PT services in outpatient setting to continue to advance safe functional mobility, address ongoing impairments in gait with prosthetic, standing balance, endurance, and minimize fall risk.  Equipment: Pt has RW, WC ramp, reacher, skin inspection mirror, BSC, transfer tub bench  Reasons for discharge: treatment goals met and discharge from hospital  Patient/family agrees with progress made and goals achieved: Yes  PT Discharge Precautions/Restrictions Precautions Precautions: Fall Precaution Comments: L AKA Required Braces or Orthoses: Other Brace Other Brace: LLE Shinker Restrictions Weight Bearing Restrictions: Yes LLE Weight Bearing: Non weight bearing Pain Interference Pain Interference Pain Effect on Sleep: 1. Rarely or not at all Pain Interference with Therapy Activities: 1. Rarely or not at all Pain  Interference with Day-to-Day Activities: 1. Rarely or not at all Vision/Perception  Vision - History Ability to See in Adequate Light: 0 Adequate Perception Perception: Within Functional Limits Praxis Praxis: WFL  Cognition Overall Cognitive Status: Within Functional Limits for tasks assessed Arousal/Alertness: Awake/alert Orientation Level: Oriented X4 Memory: Appears intact Awareness: Appears intact Problem Solving: Appears intact Safety/Judgment: Appears intact Sensation Sensation Light Touch: Impaired by gross assessment Peripheral sensation comments: Deficits due to L AKA Light Touch Impaired Details: Impaired LLE Hot/Cold: Not tested Proprioception: Appears Intact Stereognosis: Not tested Additional Comments: Pt has intermittent L LE phantom pains. Coordination Gross Motor Movements are Fluid and Coordinated: No Fine Motor Movements are Fluid and Coordinated: Yes Coordination and Movement Description: Limited due to gross weakness and recent amputation revision. Motor  Motor Motor: Within Functional Limits  Mobility Bed Mobility Bed Mobility: Rolling Right;Rolling Left;Sit to Supine;Supine to Sit Rolling Right: Independent with assistive device Rolling Left: Independent with assistive device Supine to Sit: Independent with assistive device Sit to Supine: Independent with assistive device Transfers Transfers: Sit to Stand;Stand to Sit;Stand Pivot Transfers;Squat Pivot Transfers Sit to Stand: Independent with assistive device Stand to Sit: Independent with assistive device Stand Pivot Transfers: Set up assist;Supervision/Verbal cueing (RW) Squat Pivot Transfers: Independent with assistive device Transfer (Assistive device): Rolling walker (squat pivot transfer with use of bed arm and arm rest of WC) Locomotion  Gait Ambulation: Yes Gait Assistance: Contact Guard/Touching assist Gait Distance (Feet): 10 Feet Assistive device: Rolling walker Gait Assistance  Details: Verbal cues for safe use of DME/AE;Verbal cues for sequencing Gait Gait: Yes Gait Pattern: Impaired Gait Pattern: Poor foot clearance - right;Decreased step length - right Stairs / Additional Locomotion Stairs: No Wheelchair Mobility Wheelchair Mobility: Yes Wheelchair Assistance: Independent with assistive device Wheelchair Propulsion: Both upper extremities;Right lower extremity  Wheelchair Parts Management: Independent Distance: 300  Trunk/Postural Assessment  Cervical Assessment Cervical Assessment: Within Functional Limits Thoracic Assessment Thoracic Assessment: Within Functional Limits Lumbar Assessment Lumbar Assessment: Within Functional Limits Postural Control Postural Control: Within Functional Limits  Balance Balance Balance Assessed: Yes Static Sitting Balance Static Sitting - Balance Support: Feet supported Static Sitting - Level of Assistance: 7: Independent Dynamic Sitting Balance Dynamic Sitting - Balance Support: Feet supported;During functional activity Dynamic Sitting - Level of Assistance: 6: Modified independent (Device/Increase time) Static Standing Balance Static Standing - Balance Support: Bilateral upper extremity supported Static Standing - Level of Assistance: 6: Modified independent (Device/Increase time) Dynamic Standing Balance Dynamic Standing - Balance Support: During functional activity Dynamic Standing - Level of Assistance: 5: Stand by assistance (supervision) Extremity Assessment  RLE Assessment RLE Assessment: Within Functional Limits General Strength Comments: grossly 5/5 LLE Assessment LLE Assessment: Exceptions to [facility] General Strength Comments: Grossly 3/5 hip strength, no resistance d/t high levels of pain.  Today's Interventions  Treatment Session 1   Pt supine in bed upon arrival. Pt agreeable to therapy. Pt denies any pain.   Therapist noticed increased blood drainage on shrinker and bed sheets. Notified nursing,  dressing changed. Pt able to recall and provide teach back of sequence for dressing change. Pt doffed shrinker with supervision, and donned shrinker with mod A.   Squat pivot transfer hospital bed to [facility] with mod I.   Pt self propelled WC 300 feet with mod I to [employer]. Education provided to use reacher to pick up objects off of floor, pt picked up 10 cones from various heights, in hard to reach spots, with supervision progressing to mod I, education provided to position WC to side of object versus in front to reduce fall risk. Pt demos improved carryover. Pt demos good technique of locking brakes each time prior to reaching to pick up object.   Discussed items accessible to pick up with reacher versus items too heavy to use with reacher. Eduction provided to keep readily used items inaccesible with reacher at height between hips and shoulder to reduce strain on pt and reduce fall risk. Pt verablized understanding and agreeable.   Pt supine in bed at end of session with all needs within reach.    Treatment Session 2   Pt supine in bed upon arrival. Pt agreeable to therapy. Pt denies any pain.   Pt self propelled WC throughout room to gather all items from drawers, shower, etc, and pack them for discharge tomorrow with supervision progressing to mod I, education provided to remove leg rest and use R LE to assist pt in tighter spaces, especially when one hand is occupied.   Pt self propelled WC with mod I to vending machine to get 2 snacks and a drink with mod I, pt demos good technique and carry over of wheelchair positioning. Pt self propelled WC to ortho gym with mod I.   Pt performed the following dynamic standing balance activities with CGA:    Standing with RW and boxing-pt able to complete x10 B with unilateral UE support on walker at all times, pt more fearful of lifting L UE off of walker, able to complete 3 punches on bag with L UE prior to requiring L UE support on RW.    Stood  with RW and placed 10 squigz on mirror with L UE on mirror, pt requiring R UE on RW, and needing L UE support on RW between each piece. Progressed to removing all  10 squigz at once with L UE, without L UE support on RW.   Pt supine in bed at end of session with all needs within reach and bed alarm on.    [facility] [place], [place], DPT  08/16/2023, 2:16 PM

## 2023-08-17 ENCOUNTER — Other Ambulatory Visit (HOSPITAL_COMMUNITY): Payer: Self-pay

## 2023-08-17 LAB — PROTIME-INR
INR: 2.4 — ABNORMAL HIGH (ref 0.8–1.2)
Prothrombin Time: 26 s — ABNORMAL HIGH (ref 11.4–15.2)

## 2023-08-17 LAB — GLUCOSE, CAPILLARY: Glucose-Capillary: 120 mg/dL — ABNORMAL HIGH (ref 70–99)

## 2023-08-17 LAB — OCCULT BLOOD X 1 CARD TO LAB, STOOL: Fecal Occult Bld: NEGATIVE

## 2023-08-17 MED ORDER — WARFARIN SODIUM 10 MG PO TABS
10.0000 mg | ORAL_TABLET | Freq: Every day | ORAL | 0 refills | Status: DC
  Filled 2023-08-17: qty 30, 30d supply, fill #0

## 2023-08-17 NOTE — Progress Notes (Signed)
Patient discharged with family, all belongings gathered and meds picked up from [facility] through dressing change with family member and patient and verbalized understanding. Hydrocolloid dressing placed over left hip/upper buttocks wound measured today 0.4 x 1.1, red granulating wound bed, no drainage. No other open areas of skin visualized on patients buttocks.

## 2023-08-17 NOTE — Progress Notes (Addendum)
PROGRESS NOTE   Subjective/Complaints:  No acute events today, looking forward to DC home today.   ROS: Patient denies fever, chills, abdominal pain, n/v, diarrhea, constipation, shortness of breath, chest pain, new motor or sensory changes, lightheadedness/dizziness.  Denies dysuria, denies headache  +phantom pain-improved +back pain- resolved  Objective:   No results found. Recent Labs    08/15/23 0605  WBC 7.9  HGB 7.9*  HCT 26.7*  PLT 330   Recent Labs    08/15/23 0605  NA 134*  K 4.5  CL 101  CO2 25  GLUCOSE 154*  BUN 16  CREATININE 0.95  CALCIUM 8.7*    Intake/Output Summary (Last 24 hours) at 08/17/2023 0859 Last data filed at 08/17/2023 0817 Gross per 24 hour  Intake 712 ml  Output 1525 ml  Net -813 ml        Physical Exam: Vital Signs Blood pressure 133/75, pulse 76, temperature 97.7 F (36.5 C), resp. rate 17, height 5\' 10"  (1.778 m), weight (!) 156.2 kg, SpO2 98%.   General: No acute distress HEENT: Head is normocephalic, atraumatic, MMM Heart: Reg rate and rhythm. Mechanical heart valve sound Chest: CTA bilaterally without wheezes, rales, or rhonchi; no distress Abdomen: Soft, non-tender, nondistended, bowel sounds positive Extremities:  L AKA Psych: Cooperative, pleasant Skin: Clean and intact without signs of breakdown, L AKA with staples in place, small amount of serosanguinous drainage or dressing. No erythema at surgical incision. Venous stasis changes on RLE from mid calf downwards  Neuro:  Mental Status: He is alert and oriented to person, place, and time. Mental status is at baseline.  Follows commands.    Comments: Decreased to light touch in RLE from knee downwards - chronic Musculoskeletal:   Cervical back: Neck supple.  No lower back tenderness.    Comments: Ues 5/5 in biceps, triceps, WE, grip and FA B/L RLE- 5-/5 in HF, KE, DF and PF LLE- 4 /5 in HF L AKA -dry dressing  in place wearing shrinker   Assessment/Plan: 1. Functional deficits which require 3+ hours per day of interdisciplinary therapy in a comprehensive inpatient rehab setting. Physiatrist is providing close team supervision and 24 hour management of active medical problems listed below. Physiatrist and rehab team continue to assess barriers to discharge/monitor patient progress toward functional and medical goals  Care Tool:  Bathing    Body parts bathed by patient: Right arm, Left arm, Chest, Abdomen, Front perineal area, Buttocks, Right upper leg, Left upper leg, Right lower leg, Face   Body parts bathed by helper: Buttocks, Right lower leg, Left upper leg Body parts n/a: Left lower leg   Bathing assist Assist Level: Set up assist     Upper Body Dressing/Undressing Upper body dressing   What is the patient wearing?: Pull over shirt    Upper body assist Assist Level: Set up assist    Lower Body Dressing/Undressing Lower body dressing      What is the patient wearing?: Pants, Underwear/pull up     Lower body assist Assist for lower body dressing: Supervision/Verbal cueing     Toileting Toileting Toileting Activity did not occur (Clothing management and hygiene only): N/A (no void  or bm)  Toileting assist Assist for toileting: Moderate Assistance - Patient 50 - 74%     Transfers Chair/bed transfer  Transfers assist  Chair/bed transfer activity did not occur: Safety/medical concerns  Chair/bed transfer assist level: Supervision/Verbal cueing     Locomotion Ambulation   Ambulation assist   Ambulation activity did not occur: Safety/medical concerns  Assist level: Contact Guard/Touching assist Assistive device: Walker-rolling Max distance: 10   Walk 10 feet activity   Assist  Walk 10 feet activity did not occur: Safety/medical concerns        Walk 50 feet activity   Assist Walk 50 feet with 2 turns activity did not occur: Safety/medical concerns          Walk 150 feet activity   Assist Walk 150 feet activity did not occur: Safety/medical concerns         Walk 10 feet on uneven surface  activity   Assist Walk 10 feet on uneven surfaces activity did not occur: Safety/medical concerns         Wheelchair     Assist Is the patient using a wheelchair?: Yes Type of Wheelchair: Manual Wheelchair activity did not occur: Safety/medical concerns  Wheelchair assist level: Independent Max wheelchair distance: 150    Wheelchair 50 feet with 2 turns activity    Assist    Wheelchair 50 feet with 2 turns activity did not occur: Safety/medical concerns   Assist Level: Independent   Wheelchair 150 feet activity     Assist  Wheelchair 150 feet activity did not occur: Safety/medical concerns   Assist Level: Independent   Blood pressure 133/75, pulse 76, temperature 97.7 F (36.5 C), resp. rate 17, height 5\' 10"  (1.778 m), weight (!) 156.2 kg, SpO2 98%.  Medical Problem List and Plan: 1. Functional deficits secondary to infected L BKA s/p AKA on 07/27/23              -patient may shower if able to keep incision dry             -ELOS/Goals: 10-12 days- supervision to min A at w/c level  -Continue CIR -Patient beginning to tolerate shrinker use -OK for DC home today, he can f/u in my outpatient clinic    2.  Antithrombotics: -DVT/anticoagulation:  Pharmaceutical: Lovenox 150 mg BID until Coumadin/INR therapeutic>> now on coumadin only -11/11 INR therapeutic level             -antiplatelet therapy: n/a 3. Pain Management: chronic pain and neuropathy-  Con't tylenol prn, Oxycodone 5-15 mg q4 hours prn and gabapentin  scheduled- as well as Robaxin prn  -11/4 increase gabapentin to 600 mg 3 times daily from twice daily 11/8 increase gabapentin to 800 mg 3 times daily for phantom pain , mirror therapy 11/11 pain controlled overall, continue current regimen for now.  Consider decreasing oxycodone dose later this  week 11/13 pain controlled overall, continue current regimen. Pt reports back pain similar to kidneys stone pain yesterday- this has resolved, continue to monitor   11/14 oxycodone dose adjusted to 5 to 10 mg  11/19 will decrease oxycodone as needed frequency to every 6 hours  4. Mood/Behavior/Sleep/Anxiety/Depression:              -Continue sertraline 50mg  daily and Gabapentin 600mg  BID             Also team milieu to help with mood and anxiety -antipsychotic agents: n/a  5. Neuropsych/cognition: This patient is capable of making decisions on his  own behalf. 6. Skin/Wound Care: has VAC -for 7 days from 07/27/23  -Continue vitamins -11/5 wound VAC was removed yesterday -- 11/14 discussed wound and drainage with Dr. Lajoyce Corners yesterday, Dr. Huel Cote was able to look at the wound image.  Dr. Audrie Lia drainage expected due to increased volume adipose tissue -11/20 drainage appears to be gradually decreasing   7. Fluids/Electrolytes/Nutrition/chronic hyponatremia: current Na 132- will recheck in AM             -Continue vitamins and supplements  -11/11 sodium overall stable at 133   -11/13 Na stable at 132 -08/13/23 Na 136, Cr a little up at 1.38 but likely from ?passed kidney stone; monitor -11/18 creatinine improved to 0.95  8. Diabetes type 2 with polyneuropathy, poorly controlled, Hgb A1C 10.2: SSI, increased semglee to 35U on d/c from acute, metformin to be resumed upon admission to CIR 1000 mg (home dose) BID-AC  -11/5 Increase semglee to 38 u daily as CBGs have been a little elevated -11/6 monitor response to medication change today, CBGs a little better today  -11/11 will change from regular metformin to metformin XR 1000 mg  -11/20 CBGs controlled, continue current CBG (last 3)  Recent Labs    08/16/23 1646 08/16/23 2048 08/17/23 0622  GLUCAP 151* 138* 120*    9. Acute on chronic anemia: Hgb 7.4 after 1U PRBCs on 11/1, transfuse if <7 -11/4 patient was noted to have some slight  bleeding around his backside.  Overall hemoglobin stable at 7.9 today.  -11/5 bleeding appears to be improved, recheck CBC Thursday -11/7 Transfuse 1 U PRBC due to anemia with hemoglobin 6.6.  Discussed with pharmacy, pharmacy will stop Lovenox bridging since INR likely to be therapeutic in the morning. -11/8 recheck with Hgb 7.0 this morning, will recheck again today- 7.1,FOBT pending -11/11 Hgb stable at 7.6 today -11/13 HGB 7.1, recheck tomorrow , asked nursing to check FOBT -11/15 hemoglobin stable at 7.5 yesterday, recheck tomorrow -08/13/23 Hgb stable 7.4; FOBT+ today-- will recheck CBC in the AM, and another FOBT with next BM; pt stable for now, will hold off calling GI yet.  -08/14/23 Hgb again 7.4, stable, pt without symptoms and VSS, no further BMs tested; likely would need outpatient GI f/up for FOBT+ stools and anemia, but doubt need for emergent consultation.  -11/18 hemoglobin up to 7.9, patient did have positive FOBT, recheck with next [facility] 11/19 advise rechecking outpatient with PCP, asked nursing to check FOBT if he has BM 11/20 discussed with Pt to f/u with PCP regarding CBC, ask about recheck FOBT/colonoscopy outpatient    10. HTN: continue metoprolol 25mg  BID and lisinopril 20mg  daily  -11/7 BP a little soft at times, decrease lisinopril to 10 mg daily  -11/8 will stop lisinopril for now  -08/06/23 BPs soft but stable, cont regimen for now.   -11/20 controlled Vitals:   08/14/23 0543 08/14/23 1255 08/14/23 1933 08/15/23 0440  BP: (!) 116/53 136/70 120/73 117/64   08/15/23 0756 08/15/23 1434 08/15/23 1945 08/16/23 0450  BP: 109/66 131/71 (!) 105/90 116/70   08/16/23 1454 08/16/23 1940 08/16/23 1947 08/17/23 0507  BP: 127/78 127/63 127/63 133/75     11. HLD: continue zetia 10mg  daily 12. CAD and mechanical aortic valve: on coumadin resumed on 07/28/23, pharmacy following, bridging with lovenox>> now off lovenox 13. GERD: continue protonix 40mg  daily 14. Morbid obesity:  provide dietary education 15. Thrombocytosis: likely reactive, recheck tomorrow  -Improved to 371 on 11/8  -11/14 stable at 306--313 on  08/13/23--315 08/14/23 16. MRSA positive nasal swab: mupirocin BID 17. L BKA stump infection- done with IV ABX since amputation removed infection. .  18. Microscopic hematuria- has nonobstructive renal stones- monitor clinically.  19.  Constipation.  Improved after bowel movements on 11/6 and 11/7  -11/8 will start MiraLAX daily to try to prevent further constipation -11/15 last BM 11/12, KUB should help determine stool burden, Senokot 2 tabs at bedtime, MiraLAX scheduled daily  -11/20 LBM today 20. Azotemia  -Will stop lisinopril and monitor -08/13/23 Cr 1.38, likely from recently passed kidney stone; will recheck Monday.  -11/18 creatinine down to 0.95, BUN 16, stable 21.  Nephrolithiasis -Patient had a abdominal CT on 06/28/2023 finding nonobstructing left renal stones -11/15 will recheck KUB and ultrasound bladder kidneys, check UA, Check BMP and CBC tomorrow am. Continue above medications for pain control Addendum- U/A with pyuria but no nitrates. Suspect most likely related to renal stone not UTI - will check urine culture -08/13/23 flank pain resolved, suspect passed kidney stone, renal US fairly unremarkable but sounds like maybe stone had passed. UCx not done yet, but urinary symptoms fully resolved; monitor -11/18-20 patient reports symptoms have resolved, continue to monitor for now  LOS: 17 days A FACE TO FACE EVALUATION WAS PERFORMED  Fanny Dance 08/17/2023, 8:59 AM

## 2023-08-17 NOTE — Progress Notes (Signed)
Had a quite night. Required oxycodone for pain last night. Remains alert and oriented. For possible D/C today. No new changes to report. Safety maintained.

## 2023-08-22 ENCOUNTER — Ambulatory Visit (HOSPITAL_COMMUNITY): Payer: Medicaid Other | Attending: Physical Medicine & Rehabilitation

## 2023-08-22 DIAGNOSIS — R262 Difficulty in walking, not elsewhere classified: Secondary | ICD-10-CM | POA: Diagnosis present

## 2023-08-22 DIAGNOSIS — M6281 Muscle weakness (generalized): Secondary | ICD-10-CM | POA: Insufficient documentation

## 2023-08-22 DIAGNOSIS — R2681 Unsteadiness on feet: Secondary | ICD-10-CM | POA: Insufficient documentation

## 2023-08-22 NOTE — Therapy (Signed)
[facility] OUTPATIENT PHYSICAL THERAPY EVALUATION (LOWER EXTREMITY)   Patient Name: Adam Long MRN: [id number] DOB:08/30/1976, 47 y.o., male Today's Date: 08/22/2023    END OF SESSION:   PT End of Session - 08/22/23 1251     Visit Number 1    Number of Visits 1    Authorization Type [place] [employer]    Authorization - Visit Number 1    Authorization - Number of Visits 1    PT Start Time 1145    PT Stop Time 1225    PT Time Calculation (min) 40 min    Activity Tolerance Patient tolerated treatment well              Past Medical History:  Diagnosis Date   Anxiety    Aortic stenosis    Cellulitis and abscess of lower extremity 06/11/2019   Cellulitis of fourth toe of left foot    Cholelithiasis    Coronary artery disease    Nonobstructive CAD (40-50% LAD) 08/2019   Depression    Diabetes mellitus without complication (HCC)    Phreesia 09/27/2020   Elevated troponin level not due myocardial infarction 11/11/2019   Essential hypertension    Gangrene of toe of left foot (HCC) 07/06/2019   Heart murmur    Phreesia 09/27/2020   Hyperlipidemia    Phreesia 09/27/2020   Hypertension    Phreesia 09/27/2020   Mixed hyperlipidemia    Morbid obesity (HCC)    S/P aortic valve replacement with mechanical valve 12/05/2019   25 mm Carbomedics top hat bileaflet mechanical valve via partial upper hemi-sternotomy   Severe aortic stenosis 09/24/2019   Type 2 diabetes mellitus (HCC)    Past Surgical History:  Procedure Laterality Date   ABDOMINAL AORTOGRAM W/LOWER EXTREMITY N/A 07/06/2019   Procedure: ABDOMINAL AORTOGRAM W/LOWER EXTREMITY;  Surgeon: Sherren Kerns, MD;  Location: [facility] INVASIVE CV LAB;  Service: Cardiovascular;  Laterality: N/A;  Bilateral   AMPUTATION Left 07/09/2019   Procedure: LEFT FOURTH and Fifth TOE AMPUTATION.;  Surgeon: Larina Earthly, MD;  Location: [facility] OR;  Service: Vascular;  Laterality: Left;   AMPUTATION Left 03/11/2021   Procedure: LEFT FOOT 5TH  AND 4TH RAY  AMPUTATION;  Surgeon: Nadara Mustard, MD;  Location: [facility] OR;  Service: Orthopedics;  Laterality: Left;   AMPUTATION Left 08/28/2021   Procedure: AMPUTATION BELOW KNEE;  Surgeon: Nadara Mustard, MD;  Location: [facility] OR;  Service: Orthopedics;  Laterality: Left;   AMPUTATION Left 01/29/2023   Procedure: LEFT AMPUTATION BELOW KNEE REVISION AND CLOSURE;  Surgeon: Nadara Mustard, MD;  Location: [facility] OR;  Service: Orthopedics;  Laterality: Left;   AMPUTATION Left 07/27/2023   Procedure: LEFT ABOVE KNEE AMPUTATION;  Surgeon: Nadara Mustard, MD;  Location[facility] OR;  Service: Orthopedics;  Laterality: Left;   AORTIC VALVE REPLACEMENT N/A 12/05/2019   Procedure: PARTIAL STERNOTOMY FOR AORTIC VALVE REPLACEMENT (AVR), USING CARBOMEDICS SUPRA-ANNULAR TOP HAT ;  Surgeon: Purcell Nails, MD;  Location: [facility] OR;  Service: Open Heart Surgery;  Laterality: N/A;  No neck lines on left   APPLICATION OF WOUND VAC Left 10/30/2021   Procedure: APPLICATION OF WOUND VAC;  Surgeon: Nadara Mustard, MD;  Location: [facility] OR;  Service: Orthopedics;  Laterality: Left;   APPLICATION OF WOUND VAC Left 12/25/2021   Procedure: APPLICATION OF WOUND VAC;  Surgeon: Nadara Mustard, MD;  Location: [facility] OR;  Service: Orthopedics;  Laterality: Left;   APPLICATION OF WOUND VAC Left 01/26/2023   Procedure:  APPLICATION OF WOUND VAC TO LEFT STUMP;  Surgeon: Tarry Kos, MD;  Location: [facility] OR;  Service: Orthopedics;  Laterality: Left;   CARDIAC VALVE REPLACEMENT N/A    Phreesia 09/27/2020   I & D EXTREMITY Left 01/26/2023   Procedure: IRRIGATION AND DEBRIDEMENT OF LEFT BKA STUMP;  Surgeon: Tarry Kos, MD;  Location: [facility] OR;  Service: Orthopedics;  Laterality: Left;   IR RADIOLOGY PERIPHERAL GUIDED IV START  10/05/2019   IR US GUIDE VASC ACCESS RIGHT  10/05/2019   MULTIPLE EXTRACTIONS WITH ALVEOLOPLASTY N/A 10/26/2019   Procedure: EXTRACTION OF TOOTH #'S 3, 5-11,19-28,  AND 32 WITH ALVEOLOPLASTY;  Surgeon: Charlynne Pander, DDS;  Location: [facility] OR;  Service: Oral  Surgery;  Laterality: N/A;   RIGHT HEART CATH AND CORONARY ANGIOGRAPHY N/A 09/24/2019   Procedure: RIGHT HEART CATH AND CORONARY ANGIOGRAPHY;  Surgeon: Tonny Bollman, MD;  Location: [facility] INVASIVE CV LAB;  Service: Cardiovascular;  Laterality: N/A;   STUMP REVISION Left 10/30/2021   Procedure: REVISION LEFT BELOW KNEE AMPUTATION;  Surgeon: Nadara Mustard, MD;  Location: [facility] OR;  Service: Orthopedics;  Laterality: Left;   STUMP REVISION Left 12/25/2021   Procedure: REVISION LEFT BELOW KNEE AMPUTATION;  Surgeon: Nadara Mustard, MD;  Location: [facility] OR;  Service: Orthopedics;  Laterality: Left;   TEE WITHOUT CARDIOVERSION N/A 12/05/2019   Procedure: TRANSESOPHAGEAL ECHOCARDIOGRAM (TEE);  Surgeon: Purcell Nails, MD;  Location: [facility] OR;  Service: Open Heart Surgery;  Laterality: N/A;   TEE WITHOUT CARDIOVERSION N/A 09/01/2021   Procedure: TRANSESOPHAGEAL ECHOCARDIOGRAM (TEE);  Surgeon: Little Ishikawa, MD;  Location: [facility] ENDOSCOPY;  Service: Cardiovascular;  Laterality: N/A;   Patient Active Problem List   Diagnosis Date Noted   S/P AKA (above knee amputation), left (HCC) 07/31/2023   Above knee amputation of left lower extremity (HCC) 07/31/2023   Cellulitis 07/25/2023   Amputation stump infection (HCC) 07/25/2023   Hepatic steatosis 03/25/2023   Acute blood loss anemia 02/06/2023   GERD (gastroesophageal reflux disease) 01/24/2023   History of osteomyelitis 10/12/2022   Uncontrolled type 2 diabetes mellitus with hyperglycemia (HCC) 10/12/2022   CAD (coronary artery disease) 10/12/2022   Encounter for therapeutic drug monitoring 10/12/2022   S/P BKA (below knee amputation), left (HCC) 10/30/2021   Dehiscence of amputation stump of left lower extremity (HCC)    Hyponatremia 02/13/2021   Chronic anemia 02/13/2021   Anxiety 09/30/2020   Preventative health care 12/13/2019   S/P aortic valve replacement with mechanical valve 12/05/2019   Hyperlipidemia associated with type 2 diabetes mellitus  (HCC) 10/12/2015   Class 3 obesity 10/12/2015   HTN (hypertension) 07/02/2013    PCP: Jari Pigg Provider (PCP)   REFERRING PROVIDER:   Fanny Dance, MD    REFERRING DIAG: L (AKA) DISCHARGE 08/17/2023   Rationale for Evaluation and Treatment: Rehabilitation  THERAPY DIAG:  Difficulty in walking, not elsewhere classified  Unsteadiness on feet  Muscle weakness (generalized)  ONSET DATE: left BKA Dec 2022; left AKA 07/25/23 --------------------------------------------------------------------------------------------- SUBJECTIVE:  SUBJECTIVE STATEMENT: Patient had a blister in his foot which became osteomyelitis and had left BKA Dec 2022; has prosthetic. 3-4 left BKA revisions since then due to infections. At this point, MD had to perform left AKA 06/2023. Patient was admitted for 21 days total post-op Rehab and just finished this past Wednesday. Patient was referred to OPPT.   PERTINENT HISTORY:  3-4x left BKA revisions since 2022   PAIN:  Are you having pain? Yes: NPRS scale: 5/10 Pain location: left lower extremity  Pain description: phantom pain  Aggravating factors: varies Relieving factors: n/a  PRECAUTIONS: None  RED FLAGS: Bowel or bladder incontinence: No   WEIGHT BEARING RESTRICTIONS: No  FALLS:  Has patient fallen in last 6 months? No  LIVING ENVIRONMENT: Lives with: lives with their family Lives in: House/apartment Stairs:  ramp accessible Has following equipment at home: bed side commode and wheelchair , rolling walkers   OCCUPATION: was working part time until this surgery; disability   PLOF:  Independent with BKA prosthetic   PATIENT GOALS: to return to walking   NEXT MD VISIT: Dec  5th --------------------------------------------------------------------------------------------- OBJECTIVE:   DIAGNOSTIC FINDINGS:  N/A  PATIENT SURVEYS:     SCREENING FOR RED FLAGS: Bowel or bladder incontinence: No Spinal tumors: No Cauda equina syndrome: No Compression fracture: No Abdominal aneurysm: No  COGNITION: Overall cognitive status: Within functional limits for tasks assessed  POSTURE: No Significant postural limitations      FUNCTIONAL TESTS:  Unable    GAIT ANALYSIS: (unable at this time-pending prosthetic) Distance walked:  Assistive device utilized:    Level of assistance:    Comments:   SENSATION: WFL    LOWER EXTREMITY MMT:    MMT Right eval Left eval  Hip flexion 3+/5 3-/5  Hip extension    Hip abduction    Hip adduction    Hip internal rotation    Hip external rotation    Knee flexion 4-   Knee extension 4-   Ankle dorsiflexion    Ankle plantarflexion    Ankle inversion    Ankle eversion     (Blank rows = not tested)  LOWER EXTREMITY ROM:     Active  Right eval Left eval  Hip flexion [facility]  Hip extension    Hip abduction    Hip adduction    Hip internal rotation    Hip external rotation    Knee flexion    Knee extension    Ankle dorsiflexion    Ankle plantarflexion    Ankle inversion    Ankle eversion     (Blank rows = not tested)  PALPATION: Minimal TTP left lateral lower extremity   INTEGUMENTARY   +residual limb sock --------------------------------------------------------------------------------------------- TODAY'S TREATMENT:                                                                                                                              DATE:   08/22/23 PT Initial  Eval    PATIENT EDUCATION:  Education details: activity modification  Person educated: Patient Education method: Explanation Education comprehension: verbalized understanding  HOME EXERCISE  PROGRAM: TBD --------------------------------------------------------------------------------------------- ASSESSMENT:  CLINICAL IMPRESSION: Patient is a 47 y.o. y.o. male who was seen today for physical therapy evaluation and treatment for left BKA on 07/25/23. Patient is safe/independent with transfer @ home; 1 visit only. Patient will return to PT after receiving prosthetic for Gait Training  Patient presents to PT with the following objective impairments: Abnormal gait, decreased activity tolerance, difficulty walking, decreased ROM, decreased strength, impaired flexibility, prosthetic dependency , obesity, and pain. These impairments limit the patient in activities such as carrying, lifting, bending, standing, squatting, sleeping, stairs, transfers, bed mobility, dressing, hygiene/grooming, and locomotion level. These impairments also limit the patient in participation such as meal prep, cleaning, laundry, interpersonal relationship, driving, shopping, and yard work. The patient will benefit from PT to address the limitations/impairments listed below to return to their prior level of function in the domains of activity and participation.    PERSONAL FACTORS: 1 comorbidity: morbid obesity  are also affecting patient's functional outcome.   REHAB POTENTIAL: Good  CLINICAL DECISION MAKING: Stable/uncomplicated  EVALUATION COMPLEXITY: Low  --------------------------------------------------------------------------------------------- GOALS: Goals reviewed with patient? Yes  SHORT TERM GOALS: Target date: 08/22/23   1. Patient will be independent with transfers  Baseline:  Goal status: MET   LONG TERM GOALS:  --------------------------------------------------------------------------------------------- PLAN:  PT FREQUENCY: one time visit  PT DURATION: other:    PLANNED INTERVENTIONS: 97110-Therapeutic exercises, 97530- Therapeutic activity, 97112- Neuromuscular re-education, 97535-  Self Care, [id number]- Manual therapy, [phone]- Gait training, Balance training, Stair training, Joint mobilization, Joint manipulation, Spinal manipulation, Spinal mobilization, DME instructions, Wheelchair mobility training, Cryotherapy, and Moist heat.  PLAN FOR NEXT SESSION: 1 visit   Seymour Bars, PT 08/22/2023, 12:51 PM

## 2023-09-01 ENCOUNTER — Encounter: Payer: Medicaid Other | Attending: Physical Medicine & Rehabilitation | Admitting: Physical Medicine & Rehabilitation

## 2023-09-01 ENCOUNTER — Encounter: Payer: Self-pay | Admitting: Physical Medicine & Rehabilitation

## 2023-09-01 VITALS — BP 130/75 | HR 69 | Ht 70.0 in

## 2023-09-01 DIAGNOSIS — G548 Other nerve root and plexus disorders: Secondary | ICD-10-CM | POA: Insufficient documentation

## 2023-09-01 DIAGNOSIS — Z952 Presence of prosthetic heart valve: Secondary | ICD-10-CM | POA: Diagnosis not present

## 2023-09-01 DIAGNOSIS — E1142 Type 2 diabetes mellitus with diabetic polyneuropathy: Secondary | ICD-10-CM | POA: Insufficient documentation

## 2023-09-01 DIAGNOSIS — I10 Essential (primary) hypertension: Secondary | ICD-10-CM | POA: Diagnosis present

## 2023-09-01 DIAGNOSIS — R52 Pain, unspecified: Secondary | ICD-10-CM | POA: Insufficient documentation

## 2023-09-01 DIAGNOSIS — Z89612 Acquired absence of left leg above knee: Secondary | ICD-10-CM | POA: Diagnosis present

## 2023-09-01 MED ORDER — DOXYCYCLINE HYCLATE 100 MG PO TABS: 100.0000 mg | ORAL_TABLET | Freq: Two times a day (BID) | ORAL | 0 refills | Status: DC

## 2023-09-01 MED ORDER — OXYCODONE HCL 5 MG PO TABS: 5.0000 mg | ORAL_TABLET | Freq: Four times a day (QID) | ORAL | 0 refills | Status: DC | PRN

## 2023-09-01 NOTE — Progress Notes (Signed)
Subjective:    Patient ID: Adam Long, male    DOB: 1976/09/17, 47 y.o.   MRN: [id number]  HPI  Patient is here for follow-up after discharging from [facility] after treatment for left AKA.  He is here with his cousin who has been helping him since he discharged home.  She has been helping with his home dressing changes.  Patient reports blood pressure, blood glucose have been doing well.  Reports bowels are moving well.  Over the past 2 days he reports increased drainage from an area of dehiscence on his leg wound.  He feels it is related to the staples that are in place.  Follow-up with Dr. Eual Fines scheduled next Tuesday.  He has not yet followed up with Coumadin clinic, clinic was called during our visit and next available appointment was scheduled for him.  Reports phantom pain is under fair control.  He is having some pain in his incision site.  He has been using Goody's for this -I advised him to discontinue. Oxycodone was helping keep this under control but he has used all the pills he was provided on discharge.    Pain Inventory Average Pain 8 Pain Right Now 6 My pain is intermittent, constant, burning, dull, stabbing, and aching  In the last 24 hours, has pain interfered with the following? General activity 4 Relation with others 0 Enjoyment of life 0 What TIME of day is your pain at its worst? night Sleep (in general)  Good to Fair  Pain is worse with: walking, sitting, standing, and some activites Pain improves with: rest and medication Relief from Meds: 8  walk without assistance use a walker ability to climb steps?  no do you drive?  no use a wheelchair transfers alone Do you have any goals in this area?  yes  disabled: date disabled 2022 I need assistance with the following:  household duties and shopping Do you have any goals in this area?  yes  trouble walking depression  Any changes since last visit?  no  Any changes since last visit?  no    Family  History  Problem Relation Age of Onset   Cancer Mother        Brain tumor   Heart disease Father    Hyperlipidemia Father    Hypertension Father    Stroke Father    Heart murmur Sister    Heart attack Maternal Grandmother    Liver disease Maternal Grandfather    Breast cancer Paternal Grandmother    COPD Paternal Grandfather    Heart attack Maternal Aunt    Colon cancer Maternal Uncle 67   Pancreatic cancer Paternal Aunt    Heart disease Paternal Uncle    Prostate cancer Neg Hx    Social History   Socioeconomic History   Marital status: Single    Spouse name: Not on file   Number of children: Not on file   Years of education: Not on file   Highest education level: 12th grade  Occupational History    Comment: Glass blower/designer- self-employed  Tobacco Use   Smoking status: Never   Smokeless tobacco: Former    Types: Chew    Quit date: 2021  Vaping Use   Vaping status: Never Used  Substance and Sexual Activity   Alcohol use: Yes    Alcohol/week: 4.0 standard drinks of alcohol    Types: 4 Cans of beer per week    Comment: occasionally   Drug use: No  Sexual activity: Yes    Birth control/protection: None  Other Topics Concern   Not on file  Social History Narrative   Lives with his friend.    Social Determinants of Health   Financial Resource Strain: Medium Risk (02/21/2023)   Overall Financial Resource Strain (CARDIA)    Difficulty of Paying Living Expenses: Somewhat hard  Food Insecurity: No Food Insecurity (07/30/2023)   Hunger Vital Sign    Worried About Running Out of Food in the Last Year: Never true    Ran Out of Food in the Last Year: Never true  Transportation Needs: No Transportation Needs (07/30/2023)   PRAPARE - Administrator, Civil Service (Medical): No    Lack of Transportation (Non-Medical): No  Physical Activity: Insufficiently Active (02/21/2023)   Exercise Vital Sign    Days of Exercise per Week: 1 day    Minutes of Exercise  per Session: 10 min  Stress: Stress Concern Present (02/21/2023)   [employer] of Occupational Health - Occupational Stress Questionnaire    Feeling of Stress : Rather much  Social Connections: Socially Isolated (02/21/2023)   Social Connection and Isolation Panel [NHANES]    Frequency of Communication with Friends and Family: More than three times a week    Frequency of Social Gatherings with Friends and Family: Twice a week    Attends Religious Services: Never    Database administrator or Organizations: No    Attends Engineer, structural: Not on file    Marital Status: Never married   Past Surgical History:  Procedure Laterality Date   ABDOMINAL AORTOGRAM W/LOWER EXTREMITY N/A 07/06/2019   Procedure: ABDOMINAL AORTOGRAM W/LOWER EXTREMITY;  Surgeon: Sherren Kerns, MD;  [facility]: [facility] IN[facility];  Service: Cardiovascular;  Laterality: N/A;  Bilateral   AMPUTATION Left 07/09/2019   Procedure: LEFT FOURTH and Fifth TOE AMPUTATION.;  Surgeon: Larina Earthly, MD;  Location: [facility] OR;  Service: Vascular;  Laterality: Left;   AMPUTATION Left 03/11/2021   Procedure: LEFT FOOT 5TH  AND 4TH RAY AMPUTATION;  Surgeon: Nadara Mustard, MD;  Location: [facility] OR;  Service: Orthopedics;  Laterality: Left;   AMPUTATION Left 08/28/2021   Procedure: AMPUTATION BELOW KNEE;  Surgeon: Nadara Mustard, MD;  Location: [facility] OR;  Service: Orthopedics;  Laterality: Left;   AMPUTATION Left 01/29/2023   Procedure: LEFT AMPUTATION BELOW KNEE REVISION AND CLOSURE;  Surgeon: Nadara Mustard, MD;  Location: [facility] OR;  Service: Orthopedics;  Laterality: Left;   AMPUTATION Left 07/27/2023   Procedure: LEFT ABOVE KNEE AMPUTATION;  Surgeon: Nadara Mustard, MD;  Location: [facility] OR;  Service: Orthopedics;  Laterality: Left;   AORTIC VALVE REPLACEMENT N/A 12/05/2019   Procedure: PARTIAL STERNOTOMY FOR AORTIC VALVE REPLACEMENT (AVR), USING CARBOMEDICS SUPRA-ANNULAR TOP HAT ;  Surgeon: Purcell Nails, MD;  Location: [facility] OR;   Service: Open Heart Surgery;  Laterality: N/A;  No neck lines on left   APPLICATION OF WOUND VAC Left 10/30/2021   Procedure: APPLICATION OF WOUND VAC;  Surgeon: Nadara Mustard, MD;  [facility]: [facility] OR;  Service: Orthopedics;  Laterality: Left;   APPLICATION OF WOUND VAC Left 12/25/2021   Procedure: APPLICATION OF WOUND VAC;  Surgeon: Nadara Mustard, MD;  Location: [facility] OR;  Service: Orthopedics;  Laterality: Left;   APPLICATION OF WOUND VAC Left 01/26/2023   Procedure: APPLICATION OF WOUND VAC TO LEFT STUMP;  Surgeon: Tarry Kos, MD;  Location: [facility] OR;  Service: Orthopedics;  Laterality:  Left;   CARDIAC VALVE REPLACEMENT N/A    Phreesia 09/27/2020   I & D EXTREMITY Left 01/26/2023   Procedure: IRRIGATION AND DEBRIDEMENT OF LEFT BKA STUMP;  Surgeon: Tarry Kos, MD;  Location: [facility] OR;  Service: Orthopedics;  Laterality: Left;   IR RADIOLOGY PERIPHERAL GUIDED IV START  10/05/2019   IR US GUIDE VASC ACCESS RIGHT  10/05/2019   MULTIPLE EXTRACTIONS WITH ALVEOLOPLASTY N/A 10/26/2019   Procedure: EXTRACTION OF TOOTH #'S 3, 5-11,19-28,  AND 32 WITH ALVEOLOPLASTY;  Surgeon: Charlynne Pander, DDS;  Location: [facility] OR;  Service: Oral Surgery;  Laterality: N/A;   RIGHT HEART CATH AND CORONARY ANGIOGRAPHY N/A 09/24/2019   Procedure: RIGHT HEART CATH AND CORONARY ANGIOGRAPHY;  Surgeon: Tonny Bollman, MD;  Location: [facility] INVASIVE CV LAB;  Service: Cardiovascular;  Laterality: N/A;   STUMP REVISION Left 10/30/2021   Procedure: REVISION LEFT BELOW KNEE AMPUTATION;  Surgeon: Nadara Mustard, MD;  Location: [facility] OR;  Service: Orthopedics;  Laterality: Left;   STUMP REVISION Left 12/25/2021   Procedure: REVISION LEFT BELOW KNEE AMPUTATION;  Surgeon: Nadara Mustard, MD;  Location: [facility] OR;  Service: Orthopedics;  Laterality: Left;   TEE WITHOUT CARDIOVERSION N/A 12/05/2019   Procedure: TRANSESOPHAGEAL ECHOCARDIOGRAM (TEE);  Surgeon: Purcell Nails, MD;  Location: [facility] OR;  Service: Open Heart Surgery;  Laterality: N/A;   TEE WITHOUT  CARDIOVERSION N/A 09/01/2021   Procedure: TRANSESOPHAGEAL ECHOCARDIOGRAM (TEE);  Surgeon: Little Ishikawa, MD;  Location: [facility] ENDOSCOPY;  Service: Cardiovascular;  Laterality: N/A;   Past Medical History:  Diagnosis Date   Anxiety    Aortic stenosis    Cellulitis and abscess of lower extremity 06/11/2019   Cellulitis of fourth toe of left foot    Cholelithiasis    Coronary artery disease    Nonobstructive CAD (40-50% LAD) 08/2019   Depression    Diabetes mellitus without complication (HCC)    Phreesia 09/27/2020   Elevated troponin level not due myocardial infarction 11/11/2019   Essential hypertension    Gangrene of toe of left foot (HCC) 07/06/2019   Heart murmur    Phreesia 09/27/2020   Hyperlipidemia    Phreesia 09/27/2020   Hypertension    Phreesia 09/27/2020   Mixed hyperlipidemia    Morbid obesity (HCC)    S/P aortic valve replacement with mechanical valve 12/05/2019   25 mm Carbomedics top hat bileaflet mechanical valve via partial upper hemi-sternotomy   Severe aortic stenosis 09/24/2019   Type 2 diabetes mellitus (HCC)    BP 130/75   Pulse 69   Ht 5\' 10"  (1.778 m)   SpO2 98%   BMI 49.41 kg/m   Opioid Risk Score:   Fall Risk Score:  `1  Depression screen [facility] 2/9     03/24/2023    9:18 AM 02/22/2023    9:05 AM 10/12/2022    2:35 PM 04/01/2022    3:19 PM 01/18/2022    9:02 AM 12/07/2021   10:33 AM 10/19/2021   10:30 AM  Depression screen PHQ 2/9  Decreased Interest 2 1 1 1  0 0 1  Down, Depressed, Hopeless 2 0 1 1 1 1 1   PHQ - 2 Score 4 1 2 2 1 1 2   Altered sleeping 0  1 0   0  Tired, decreased energy 2  1 1    0  Change in appetite 0  0 0   0  Feeling bad or failure about yourself  0  2 1   1  Trouble concentrating 2  1 0   1  Moving slowly or fidgety/restless 0  0 0   0  Suicidal thoughts 0  0 0   0  PHQ-9 Score 8  7 4   4   Difficult doing work/chores Not difficult at all  Somewhat difficult Not difficult at all       Review of Systems   Musculoskeletal:  Positive for gait problem.  Psychiatric/Behavioral:         Depression, anxiety  All other systems reviewed and are negative.      Objective:   Physical Exam  Gen: no distress, sitting in wheelchair HEENT: oral mucosa pink and moist, NCAT Chest: normal effort, normal rate of breathing Abd: soft, non-distended Ext: Left AKA Psych: pleasant, normal affect Skin: Drainage and wound dehiscence from left AKA-please see image Neuro: Alert and awake, follows commands Musculoskeletal: Moving all extremities to gravity and resistance         Assessment & Plan:    Medical Problem List and Plan: 1. L AKA due to infected L BKA              -F/u with Dr. Lajoyce Corners   -Continue gabapentin 800mg  TID for phantom pain   -Short term oxycodone 5mg  q6h prn ordered, #20  -Wound dehiscence noted with increased drainage, discussed with Dr. Lajoyce Corners.  Recommended elevation and compression.  Doxycycline 100 mg twice daily for 7 days ordered.  Appreciate assistance.     2.  s/p AVR on Coumadin   -Follow-up visit in Coumadin clinic was scheduled during office visit    3. Diabetes type 2 with polyneuropathy  -Patient reports CBGs been well-controlled.  Follow-up with PCP recommended     4. Acute on chronic anemia  -Advise follow-up with PCP              5. HTN.  Controlled today advise follow-up with PCP for further management    09/01/2023   11:44 AM 08/17/2023    5:07 AM 08/16/2023    7:47 PM  Vitals with BMI  Height 5\' 10"     Weight --    Systolic 130 133 409  Diastolic 75 75 63  Pulse 69 76 86    6.  Constipation.  Patient reports this has improved/resolved

## 2023-09-05 ENCOUNTER — Ambulatory Visit: Payer: Medicaid Other | Attending: Cardiology | Admitting: *Deleted

## 2023-09-05 DIAGNOSIS — Z952 Presence of prosthetic heart valve: Secondary | ICD-10-CM | POA: Diagnosis not present

## 2023-09-05 DIAGNOSIS — Z5181 Encounter for therapeutic drug level monitoring: Secondary | ICD-10-CM | POA: Diagnosis not present

## 2023-09-05 LAB — POCT INR: INR: 2.3 (ref 2.0–3.0)

## 2023-09-05 NOTE — Patient Instructions (Signed)
Continue warfarin 1 tablet daily expcet for 1/2 tablet on Monday, Wednesday and Friday. Recheck in 4 wks.

## 2023-09-06 ENCOUNTER — Encounter: Payer: Self-pay | Admitting: Family

## 2023-09-06 ENCOUNTER — Ambulatory Visit (INDEPENDENT_AMBULATORY_CARE_PROVIDER_SITE_OTHER): Payer: Medicaid Other | Admitting: Family

## 2023-09-06 ENCOUNTER — Other Ambulatory Visit: Payer: Self-pay | Admitting: Physical Medicine & Rehabilitation

## 2023-09-06 DIAGNOSIS — Z89612 Acquired absence of left leg above knee: Secondary | ICD-10-CM

## 2023-09-06 DIAGNOSIS — F419 Anxiety disorder, unspecified: Secondary | ICD-10-CM

## 2023-09-06 NOTE — Progress Notes (Signed)
Post-Op Visit Note   Patient: Adam Long           Date of Birth: 1976/08/12           MRN: [id number] Visit Date: 09/06/2023 PCP: [name], DO  Chief Complaint:  Chief Complaint  Patient presents with   Left Leg - Routine Post Op    07/27/2023 left AKA    HPI:  HPI The patient is a 47 year old gentleman seen status post left above-knee amputation October 30.  Ortho Exam On examination of the left above-knee amputation staples are in place there is 1 area of dehiscence centrally this is 2 cm in diameter there is granulation in the wound bed there is serosanguineous drainage this probes 5 mm deep does not probe to bone there is no sign of infection today  Visit Diagnoses: No diagnosis found.  Plan: Continue daily dose of cleansing pack open with silver cell continue shrinker will follow-up in the office in 3 weeks  Follow-Up Instructions: Return in about 2 weeks (around 09/20/2023).   Imaging: No results found.  Orders:  No orders of the defined types were placed in this encounter.  No orders of the defined types were placed in this encounter.    PMFS History: Patient Active Problem List   Diagnosis Date Noted   S/P AKA (above knee amputation), left (HCC) 07/31/2023   Above knee amputation of left lower extremity (HCC) 07/31/2023   Cellulitis 07/25/2023   Amputation stump infection (HCC) 07/25/2023   Hepatic steatosis 03/25/2023   Acute blood loss anemia 02/06/2023   GERD (gastroesophageal reflux disease) 01/24/2023   History of osteomyelitis 10/12/2022   Uncontrolled type 2 diabetes mellitus with hyperglycemia (HCC) 10/12/2022   CAD (coronary artery disease) 10/12/2022   Encounter for therapeutic drug monitoring 10/12/2022   S/P BKA (below knee amputation), left (HCC) 10/30/2021   Dehiscence of amputation stump of left lower extremity (HCC)    Hyponatremia 02/13/2021   Chronic anemia 02/13/2021   Anxiety 09/30/2020   Preventative health care  12/13/2019   S/P aortic valve replacement with mechanical valve 12/05/2019   Hyperlipidemia associated with type 2 diabetes mellitus (HCC) 10/12/2015   Class 3 obesity 10/12/2015   HTN (hypertension) 07/02/2013   Past Medical History:  Diagnosis Date   Anxiety    Aortic stenosis    Cellulitis and abscess of lower extremity 06/11/2019   Cellulitis of fourth toe of left foot    Cholelithiasis    Coronary artery disease    Nonobstructive CAD (40-50% LAD) 08/2019   Depression    Diabetes mellitus without complication (HCC)    Phreesia 09/27/2020   Elevated troponin level not due myocardial infarction 11/11/2019   Essential hypertension    Gangrene of toe of left foot (HCC) 07/06/2019   Heart murmur    Phreesia 09/27/2020   Hyperlipidemia    Phreesia 09/27/2020   Hypertension    Phreesia 09/27/2020   Mixed hyperlipidemia    Morbid obesity (HCC)    S/P aortic valve replacement with mechanical valve 12/05/2019   25 mm Carbomedics top hat bileaflet mechanical valve via partial upper hemi-sternotomy   Severe aortic stenosis 09/24/2019   Type 2 diabetes mellitus (HCC)     Family History  Problem Relation Age of Onset   Cancer Mother        Brain tumor   Heart disease Father    Hyperlipidemia Father    Hypertension Father    Stroke Father    Heart  murmur Sister    Heart attack Maternal Grandmother    Liver disease Maternal Grandfather    Breast cancer Paternal Grandmother    COPD Paternal Grandfather    Heart attack Maternal Aunt    Colon cancer Maternal Uncle 67   Pancreatic cancer Paternal Aunt    Heart disease Paternal Uncle    Prostate cancer Neg Hx     Past Surgical History:  Procedure Laterality Date   ABDOMINAL AORTOGRAM W/LOWER EXTREMITY N/A 07/06/2019   Procedure: ABDOMINAL AORTOGRAM W/LOWER EXTREMITY;  Surgeon: Sherren Kerns, MD;  [facility]: [facility] INVAS[facility] LAB;  Service: Cardiovascular;  Laterality: N/A;  Bilateral   AMPUTATION Left 07/09/2019   Procedure:  LEFT FOURTH and Fifth TOE AMPUTATION.;  Surgeon: Larina Earthly, MD;  Location: [facility] OR;  Service: Vascular;  Laterality: Left;   AMPUTATION Left 03/11/2021   Procedure: LEFT FOOT 5TH  AND 4TH RAY AMPUTATION;  Surgeon: Nadara Mustard, MD;  Location: [facility] OR;  Service: Orthopedics;  Laterality: Left;   AMPUTATION Left 08/28/2021   Procedure: AMPUTATION BELOW KNEE;  Surgeon: Nadara Mustard, MD;  Location: [facility] OR;  Service: Orthopedics;  Laterality: Left;   AMPUTATION Left 01/29/2023   Procedure: LEFT AMPUTATION BELOW KNEE REVISION AND CLOSURE;  Surgeon: Nadara Mustard, MD;  Location: [facility] OR;  Service: Orthopedics;  Laterality: Left;   AMPUTATION Left 07/27/2023   Procedure: LEFT ABOVE KNEE AMPUTATION;  Surgeon: Nadara Mustard, MD;  Location: [facility] OR;  Service: Orthopedics;  Laterality: Left;   AORTIC VALVE REPLACEMENT N/A 12/05/2019   Procedure: PARTIAL STERNOTOMY FOR AORTIC VALVE REPLACEMENT (AVR), USING CARBOMEDICS SUPRA-ANNULAR TOP HAT ;  Surgeon: Purcell Nails, MD;  Location: [facility] OR;  Service: Open Heart Surgery;  Laterality: N/A;  No neck lines on left   APPLICATION OF WOUND VAC Left 10/30/2021   Procedure: APPLICATION OF WOUND VAC;  Surgeon: Nadara Mustard, MD;  Location: [facility] OR;  Service: Orthopedics;  Laterality: Left;   APPLICATION OF WOUND VAC Left 12/25/2021   Procedure: APPLICATION OF WOUND VAC;  Surgeon: Nadara Mustard, MD;  Location: [facility] OR;  Service: Orthopedics;  Laterality: Left;   APPLICATION OF WOUND VAC Left 01/26/2023   Procedure: APPLICATION OF WOUND VAC TO LEFT STUMP;  Surgeon: Tarry Kos, MD;  Location: [facility] OR;  Service: Orthopedics;  Laterality: Left;   CARDIAC VALVE REPLACEMENT N/A    Phreesia 09/27/2020   I & D EXTREMITY Left 01/26/2023   Procedure: IRRIGATION AND DEBRIDEMENT OF LEFT BKA STUMP;  Surgeon: Tarry Kos, MD;  Location: [facility] OR;  Service: Orthopedics;  Laterality: Left;   IR RADIOLOGY PERIPHERAL GUIDED IV START  10/05/2019   IR US GUIDE VASC ACCESS RIGHT  10/05/2019    MULTIPLE EXTRACTIONS WITH ALVEOLOPLASTY N/A 10/26/2019   Procedure: EXTRACTION OF TOOTH #'S 3, 5-11,19-28,  AND 32 WITH ALVEOLOPLASTY;  Surgeon: Charlynne Pander, DDS;  Location: [facility] OR;  Service: Oral Surgery;  Laterality: N/A;   RIGHT HEART CATH AND CORONARY ANGIOGRAPHY N/A 09/24/2019   Procedure: RIGHT HEART CATH AND CORONARY ANGIOGRAPHY;  Surgeon: Tonny Bollman, MD;  Location: [facility] INVASIVE CV LAB;  Service: Cardiovascular;  Laterality: N/A;   STUMP REVISION Left 10/30/2021   Procedure: REVISION LEFT BELOW KNEE AMPUTATION;  Surgeon: Nadara Mustard, MD;  Location: [facility] OR;  Service: Orthopedics;  Laterality: Left;   STUMP REVISION Left 12/25/2021   Procedure: REVISION LEFT BELOW KNEE AMPUTATION;  Surgeon: Nadara Mustard, MD;  Location: [facility] OR;  Service: Orthopedics;  Laterality: Left;   TEE WITHOUT CARDIOVERSION N/A 12/05/2019   Procedure: TRANSESOPHAGEAL ECHOCARDIOGRAM (TEE);  Surgeon: Purcell Nails, MD;  Location: [facility] OR;  Service: Open Heart Surgery;  Laterality: N/A;   TEE WITHOUT CARDIOVERSION N/A 09/01/2021   Procedure: TRANSESOPHAGEAL ECHOCARDIOGRAM (TEE);  Surgeon: Little Ishikawa, MD;  Location: [facility] ENDOSCOPY;  Service: Cardiovascular;  Laterality: N/A;   Social History   Occupational History    Comment: Glass blower/designer- self-employed  Tobacco Use   Smoking status: Never   Smokeless tobacco: Former    Types: Chew    Quit date: 2021  Vaping Use   Vaping status: Never Used  Substance and Sexual Activity   Alcohol use: Yes    Alcohol/week: 4.0 standard drinks of alcohol    Types: 4 Cans of beer per week    Comment: occasionally   Drug use: No   Sexual activity: Yes    Birth control/protection: None

## 2023-09-07 MED ORDER — OXYCODONE HCL 5 MG PO TABS: 5.0000 mg | ORAL_TABLET | Freq: Four times a day (QID) | ORAL | 0 refills | Status: DC | PRN

## 2023-09-13 ENCOUNTER — Other Ambulatory Visit (HOSPITAL_COMMUNITY): Payer: Self-pay

## 2023-09-14 ENCOUNTER — Other Ambulatory Visit: Payer: Self-pay

## 2023-09-14 ENCOUNTER — Other Ambulatory Visit (HOSPITAL_COMMUNITY): Payer: Self-pay

## 2023-09-19 ENCOUNTER — Other Ambulatory Visit (HOSPITAL_COMMUNITY): Payer: Self-pay

## 2023-09-19 MED ORDER — METFORMIN HCL ER 500 MG PO TB24
1000.0000 mg | ORAL_TABLET | Freq: Two times a day (BID) | ORAL | 0 refills | Status: DC
  Filled 2023-09-19 – 2023-10-18 (×2): qty 120, 30d supply, fill #0

## 2023-09-19 MED ORDER — WARFARIN SODIUM 10 MG PO TABS
10.0000 mg | ORAL_TABLET | Freq: Every day | ORAL | 0 refills | Status: DC
  Filled 2023-09-19 – 2023-10-18 (×2): qty 30, 30d supply, fill #0

## 2023-09-19 MED ORDER — SERTRALINE HCL 50 MG PO TABS
50.0000 mg | ORAL_TABLET | Freq: Every day | ORAL | 0 refills | Status: DC
  Filled 2023-09-19 – 2023-12-12 (×4): qty 30, 30d supply, fill #0

## 2023-09-19 MED ORDER — PANTOPRAZOLE SODIUM 40 MG PO TBEC
40.0000 mg | DELAYED_RELEASE_TABLET | Freq: Every day | ORAL | 0 refills | Status: DC
  Filled 2023-09-19 – 2023-12-12 (×4): qty 30, 30d supply, fill #0

## 2023-09-19 MED ORDER — GABAPENTIN 400 MG PO CAPS
800.0000 mg | ORAL_CAPSULE | Freq: Three times a day (TID) | ORAL | 0 refills | Status: DC
  Filled 2023-09-19 – 2023-10-18 (×2): qty 180, 30d supply, fill #0

## 2023-09-19 MED ORDER — METOPROLOL TARTRATE 25 MG PO TABS
25.0000 mg | ORAL_TABLET | Freq: Two times a day (BID) | ORAL | 0 refills | Status: DC
  Filled 2023-09-19 – 2023-12-12 (×4): qty 60, 30d supply, fill #0

## 2023-09-23 ENCOUNTER — Ambulatory Visit (INDEPENDENT_AMBULATORY_CARE_PROVIDER_SITE_OTHER): Payer: Medicaid Other | Admitting: Family Medicine

## 2023-09-23 VITALS — BP 135/80 | HR 88 | Temp 98.8°F | Ht 70.0 in

## 2023-09-23 DIAGNOSIS — Z7984 Long term (current) use of oral hypoglycemic drugs: Secondary | ICD-10-CM

## 2023-09-23 DIAGNOSIS — E785 Hyperlipidemia, unspecified: Secondary | ICD-10-CM | POA: Diagnosis not present

## 2023-09-23 DIAGNOSIS — Z794 Long term (current) use of insulin: Secondary | ICD-10-CM

## 2023-09-23 DIAGNOSIS — F419 Anxiety disorder, unspecified: Secondary | ICD-10-CM | POA: Diagnosis not present

## 2023-09-23 DIAGNOSIS — T8744 Infection of amputation stump, left lower extremity: Secondary | ICD-10-CM

## 2023-09-23 DIAGNOSIS — D649 Anemia, unspecified: Secondary | ICD-10-CM

## 2023-09-23 DIAGNOSIS — E1165 Type 2 diabetes mellitus with hyperglycemia: Secondary | ICD-10-CM

## 2023-09-23 DIAGNOSIS — T8740 Infection of amputation stump, unspecified extremity: Secondary | ICD-10-CM

## 2023-09-23 MED ORDER — BUSPIRONE HCL 7.5 MG PO TABS: 7.5000 mg | ORAL_TABLET | Freq: Two times a day (BID) | ORAL | 3 refills | Status: DC

## 2023-09-23 MED ORDER — DOXYCYCLINE HYCLATE 100 MG PO TABS: 100.0000 mg | ORAL_TABLET | Freq: Two times a day (BID) | ORAL | 0 refills | Status: DC

## 2023-09-23 NOTE — Patient Instructions (Signed)
I have reached out to ortho.  Labs today.  Medications as prescribed.  Follow up in 1 month

## 2023-09-24 LAB — IRON,TIBC AND FERRITIN PANEL
%SAT: 3 % — ABNORMAL LOW (ref 20–48)
Ferritin: 15 ng/mL — ABNORMAL LOW (ref 38–380)
Iron: 11 ug/dL — ABNORMAL LOW (ref 50–180)
TIBC: 407 ug/dL (ref 250–425)

## 2023-09-24 LAB — CBC
HCT: 29 % — ABNORMAL LOW (ref 38.5–50.0)
Hemoglobin: 8.3 g/dL — ABNORMAL LOW (ref 13.2–17.1)
MCH: 19.4 pg — ABNORMAL LOW (ref 27.0–33.0)
MCHC: 28.6 g/dL — ABNORMAL LOW (ref 32.0–36.0)
MCV: 67.8 fL — ABNORMAL LOW (ref 80.0–100.0)
MPV: 9.7 fL (ref 7.5–12.5)
Platelets: 415 10*3/uL — ABNORMAL HIGH (ref 140–400)
RBC: 4.28 10*6/uL (ref 4.20–5.80)
RDW: 17.2 % — ABNORMAL HIGH (ref 11.0–15.0)
WBC: 12.2 10*3/uL — ABNORMAL HIGH (ref 3.8–10.8)

## 2023-09-24 LAB — COMPREHENSIVE METABOLIC PANEL
AG Ratio: 1.3 (calc) (ref 1.0–2.5)
ALT: 12 U/L (ref 9–46)
AST: 13 U/L (ref 10–40)
Albumin: 3.8 g/dL (ref 3.6–5.1)
Alkaline phosphatase (APISO): 68 U/L (ref 36–130)
BUN: 14 mg/dL (ref 7–25)
CO2: 26 mmol/L (ref 20–32)
Calcium: 9.4 mg/dL (ref 8.6–10.3)
Chloride: 100 mmol/L (ref 98–110)
Creat: 0.74 mg/dL (ref 0.60–1.29)
Globulin: 3 g/dL (ref 1.9–3.7)
Glucose, Bld: 242 mg/dL — ABNORMAL HIGH (ref 65–139)
Potassium: 4.6 mmol/L (ref 3.5–5.3)
Sodium: 135 mmol/L (ref 135–146)
Total Bilirubin: 0.4 mg/dL (ref 0.2–1.2)
Total Protein: 6.8 g/dL (ref 6.1–8.1)

## 2023-09-24 LAB — LIPID PANEL
Cholesterol: 166 mg/dL (ref <200)
HDL: 32 mg/dL — ABNORMAL LOW (ref >=40)
LDL Cholesterol (Calc): 92 mg/dL
Non-HDL Cholesterol (Calc): 134 mg/dL — ABNORMAL HIGH (ref <130)
Total CHOL/HDL Ratio: 5.2 (calc) — ABNORMAL HIGH (ref <5.0)
Triglycerides: 317 mg/dL — ABNORMAL HIGH (ref <150)

## 2023-09-26 NOTE — Assessment & Plan Note (Signed)
Uncontrolled/worsening. Starting Buspar.

## 2023-09-26 NOTE — Assessment & Plan Note (Signed)
Concern for infection. Placing on Doxy. Needs follow up with Ortho. May need wound vac or return to OR.

## 2023-09-26 NOTE — Progress Notes (Signed)
Subjective:  Patient ID: Adam Long, male    DOB: 1976-07-09  Age: 47 y.o. MRN: [id number]  CC:  Follow up   HPI:  46 year old male with Uncontrolled Type 2 Diabetes with recent AKA presents for follow up.  Has an open wound/dehiscence.  Recently seen by Ortho. Needs follow up. Patient and his caregiver are concerned.  Diabetes is uncontrolled. No blood sugar readings with him today. He does state that his readings have not been above 200. Most recent A1c was 10.2 (07/26/23). No hypoglycemia. Remains on Lantus 38 units and Metformin.  Patient reports ongoing anxiety. Very anxious. Constant worrying. Difficulty sleeping. Currently on Zoloft 50 mg daily.  Patient Active Problem List   Diagnosis Date Noted   S/P AKA (above knee amputation), left (HCC) 07/31/2023   Amputation stump infection (HCC) 07/25/2023   Hepatic steatosis 03/25/2023   GERD (gastroesophageal reflux disease) 01/24/2023   History of osteomyelitis 10/12/2022   Uncontrolled type 2 diabetes mellitus with hyperglycemia (HCC) 10/12/2022   CAD (coronary artery disease) 10/12/2022   Encounter for therapeutic drug monitoring 10/12/2022   Dehiscence of amputation stump of left lower extremity (HCC)    Chronic anemia 02/13/2021   Anxiety 09/30/2020   Preventative health care 12/13/2019   S/P aortic valve replacement with mechanical valve 12/05/2019   Hyperlipidemia associated with type 2 diabetes mellitus (HCC) 10/12/2015   Class 3 obesity 10/12/2015   HTN (hypertension) 07/02/2013    Social Hx   Social History   Socioeconomic History   Marital status: Single    Spouse name: Not on file   Number of children: Not on file   Years of education: Not on file   Highest education level: Some college, no degree  Occupational History    Comment: Glass blower/designer- self-employed  Tobacco Use   Smoking status: Never   Smokeless tobacco: Former    Types: Chew    Quit date: 2021  Vaping Use   Vaping status:  Never Used  Substance and Sexual Activity   Alcohol use: Yes    Alcohol/week: 4.0 standard drinks of alcohol    Types: 4 Cans of beer per week    Comment: occasionally   Drug use: No   Sexual activity: Yes    Birth control/protection: None  Other Topics Concern   Not on file  Social History Narrative   Lives with his friend.    Social Drivers of Corporate investment banker Strain: Low Risk  (09/22/2023)   Overall Financial Resource Strain (CARDIA)    Difficulty of Paying Living Expenses: Not hard at all  Food Insecurity: No Food Insecurity (09/22/2023)   Hunger Vital Sign    Worried About Running Out of Food in the Last Year: Never true    Ran Out of Food in the Last Year: Never true  Transportation Needs: No Transportation Needs (09/22/2023)   PRAPARE - Administrator, Civil Service (Medical): No    Lack of Transportation (Non-Medical): No  Physical Activity: Inactive (09/22/2023)   Exercise Vital Sign    Days of Exercise per Week: 0 days    Minutes of Exercise per Session: 10 min  Stress: Stress Concern Present (09/22/2023)   [employer] of Occupational Health - Occupational Stress Questionnaire    Feeling of Stress : Very much  Social Connections: Socially Isolated (09/22/2023)   Social Connection and Isolation Panel [NHANES]    Frequency of Communication with Friends and Family: More than three times  a week    Frequency of Social Gatherings with Friends and Family: Twice a week    Attends Religious Services: Never    Database administrator or Organizations: No    Attends Engineer, structural: Not on file    Marital Status: Never married    Review of Systems Per HPI  Objective:  BP 135/80   Pulse 88   Temp 98.8 F (37.1 C)   Ht 5\' 10"  (1.778 m)   SpO2 100%   BMI 49.41 kg/m      09/23/2023    1:06 PM 09/01/2023   11:44 AM 08/17/2023    5:07 AM  BP/Weight  Systolic BP 135 130 133  Diastolic BP 80 75 75  Wt. (Lbs)  --      Physical Exam Vitals and nursing note reviewed.  Constitutional:      General: He is not in acute distress.    Appearance: He is obese.  HENT:     Head: Normocephalic and atraumatic.  Cardiovascular:     Rate and Rhythm: Normal rate and regular rhythm.  Pulmonary:     Effort: Pulmonary effort is normal.     Breath sounds: Normal breath sounds. No wheezing, rhonchi or rales.  Skin:    Comments: Left AKA - open wound noted. Draining. No significant surrounding erythema.   Neurological:     Mental Status: He is alert.     Lab Results  Component Value Date   WBC 12.2 (H) 09/23/2023   HGB 8.3 (L) 09/23/2023   HCT 29.0 (L) 09/23/2023   PLT 415 (H) 09/23/2023   GLUCOSE 242 (H) 09/23/2023   CHOL 166 09/23/2023   TRIG 317 (H) 09/23/2023   HDL 32 (L) 09/23/2023   LDLCALC 92 09/23/2023   ALT 12 09/23/2023   AST 13 09/23/2023   NA 135 09/23/2023   K 4.6 09/23/2023   CL 100 09/23/2023   CREATININE 0.74 09/23/2023   BUN 14 09/23/2023   CO2 26 09/23/2023   TSH 2.520 04/27/2022   INR 2.3 09/05/2023   HGBA1C 10.2 (H) 07/26/2023   MICROALBUR 0.4 06/13/2020     Assessment & Plan:   Problem List Items Addressed This Visit       Endocrine   Uncontrolled type 2 diabetes mellitus with hyperglycemia (HCC)   Uncontrolled. Labs today. Advised to return in 1 month with blood sugar readings.      Relevant Orders   Comprehensive metabolic panel (Completed)     Other   Chronic anemia   Relevant Orders   CBC (Completed)   Iron, TIBC and Ferritin Panel (Completed)   Amputation stump infection (HCC)   Concern for infection. Placing on Doxy. Needs follow up with Ortho. May need wound vac or return to OR.      Anxiety   Uncontrolled/worsening. Starting Buspar.      Relevant Medications   busPIRone (BUSPAR) 7.5 MG tablet   Other Visit Diagnoses       Hyperlipidemia, unspecified hyperlipidemia type       Relevant Orders   Lipid panel (Completed)       Meds ordered  this encounter  Medications   busPIRone (BUSPAR) 7.5 MG tablet    Sig: Take 1 tablet (7.5 mg total) by mouth 2 (two) times daily.    Dispense:  60 tablet    Refill:  3   doxycycline (VIBRA-TABS) 100 MG tablet    Sig: Take 1 tablet (100 mg total) by mouth 2 (  two) times daily.    Dispense:  14 tablet    Refill:  0    Follow-up:  Return in about 1 month (around 10/24/2023).  Everlene Other DO [facility]

## 2023-09-26 NOTE — Assessment & Plan Note (Signed)
Uncontrolled. Labs today. Advised to return in 1 month with blood sugar readings.

## 2023-10-02 ENCOUNTER — Other Ambulatory Visit: Payer: Self-pay | Admitting: Family Medicine

## 2023-10-02 MED ORDER — ROSUVASTATIN CALCIUM 10 MG PO TABS: 10.0000 mg | ORAL_TABLET | Freq: Every day | ORAL | 3 refills | Status: AC

## 2023-10-03 ENCOUNTER — Encounter: Payer: Medicaid Other | Attending: Physical Medicine & Rehabilitation | Admitting: Physical Medicine & Rehabilitation

## 2023-10-03 DIAGNOSIS — G548 Other nerve root and plexus disorders: Secondary | ICD-10-CM | POA: Insufficient documentation

## 2023-10-03 DIAGNOSIS — R52 Pain, unspecified: Secondary | ICD-10-CM | POA: Insufficient documentation

## 2023-10-03 DIAGNOSIS — E1142 Type 2 diabetes mellitus with diabetic polyneuropathy: Secondary | ICD-10-CM | POA: Insufficient documentation

## 2023-10-03 DIAGNOSIS — I10 Essential (primary) hypertension: Secondary | ICD-10-CM | POA: Insufficient documentation

## 2023-10-03 DIAGNOSIS — Z89612 Acquired absence of left leg above knee: Secondary | ICD-10-CM | POA: Insufficient documentation

## 2023-10-03 DIAGNOSIS — Z952 Presence of prosthetic heart valve: Secondary | ICD-10-CM | POA: Insufficient documentation

## 2023-10-05 ENCOUNTER — Telehealth: Payer: Self-pay | Admitting: Pharmacist

## 2023-10-05 ENCOUNTER — Ambulatory Visit: Payer: Medicaid Other | Attending: Cardiology

## 2023-10-05 ENCOUNTER — Encounter: Payer: Medicaid Other | Admitting: Family

## 2023-10-06 NOTE — Telephone Encounter (Signed)
 ADDITIONAL INFO FAXED TO [facility] PATIENT'S A1C HAS IMPROVED WITH USE OF CGM 13.4%-->10.2% ON INSULIN  AKA NEEDS CGM AND BETTER GLYCEMIC CONTROL

## 2023-10-07 ENCOUNTER — Encounter: Payer: Medicaid Other | Admitting: Family

## 2023-10-07 ENCOUNTER — Other Ambulatory Visit (HOSPITAL_COMMUNITY): Payer: Self-pay

## 2023-10-18 ENCOUNTER — Other Ambulatory Visit: Payer: Self-pay

## 2023-10-18 ENCOUNTER — Other Ambulatory Visit (HOSPITAL_COMMUNITY): Payer: Self-pay

## 2023-10-19 ENCOUNTER — Ambulatory Visit: Payer: Medicaid Other | Attending: Physical Medicine & Rehabilitation

## 2023-10-19 ENCOUNTER — Other Ambulatory Visit: Payer: Self-pay | Admitting: Family Medicine

## 2023-10-19 ENCOUNTER — Other Ambulatory Visit: Payer: Self-pay

## 2023-10-19 ENCOUNTER — Other Ambulatory Visit (HOSPITAL_BASED_OUTPATIENT_CLINIC_OR_DEPARTMENT_OTHER): Payer: Self-pay

## 2023-10-19 ENCOUNTER — Other Ambulatory Visit (HOSPITAL_COMMUNITY): Payer: Self-pay

## 2023-10-19 DIAGNOSIS — Z954 Presence of other heart-valve replacement: Secondary | ICD-10-CM

## 2023-10-19 DIAGNOSIS — Z5181 Encounter for therapeutic drug level monitoring: Secondary | ICD-10-CM

## 2023-10-19 DIAGNOSIS — E1169 Type 2 diabetes mellitus with other specified complication: Secondary | ICD-10-CM

## 2023-10-19 LAB — POCT INR: INR: 1.5 — AB (ref 2.0–3.0)

## 2023-10-19 MED ORDER — EZETIMIBE 10 MG PO TABS
10.0000 mg | ORAL_TABLET | Freq: Every day | ORAL | 0 refills | Status: DC
  Filled 2023-10-19 – 2023-11-20 (×2): qty 30, 30d supply, fill #0

## 2023-10-19 NOTE — Patient Instructions (Signed)
Take warfarin 1 1/2 tablets tonight and tomorrow night then resume 1 tablet daily expcet for 1/2 tablet on Monday, Wednesday and Friday.  Recheck in 2 wks.

## 2023-10-20 ENCOUNTER — Other Ambulatory Visit: Payer: Self-pay

## 2023-10-24 ENCOUNTER — Encounter: Payer: Self-pay | Admitting: Family Medicine

## 2023-10-24 ENCOUNTER — Ambulatory Visit (INDEPENDENT_AMBULATORY_CARE_PROVIDER_SITE_OTHER): Payer: Medicaid Other | Admitting: Family Medicine

## 2023-10-24 VITALS — BP 134/70 | HR 84 | Temp 97.3°F | Ht 70.0 in

## 2023-10-24 DIAGNOSIS — Z7984 Long term (current) use of oral hypoglycemic drugs: Secondary | ICD-10-CM

## 2023-10-24 DIAGNOSIS — E1165 Type 2 diabetes mellitus with hyperglycemia: Secondary | ICD-10-CM | POA: Diagnosis not present

## 2023-10-24 DIAGNOSIS — F419 Anxiety disorder, unspecified: Secondary | ICD-10-CM | POA: Diagnosis not present

## 2023-10-24 DIAGNOSIS — I10 Essential (primary) hypertension: Secondary | ICD-10-CM | POA: Diagnosis not present

## 2023-10-24 DIAGNOSIS — Z794 Long term (current) use of insulin: Secondary | ICD-10-CM

## 2023-10-24 NOTE — Progress Notes (Signed)
Subjective:  Patient ID: Adam Long, male    DOB: October 18, 1975  Age: 48 y.o. MRN: [id number]  CC:   Chief Complaint  Patient presents with   Follow-up    1 month f/u     HPI:  48 year old male with an extensive past medical history as seen below presents for follow-up.  HTN stable.   Patient reports that his glycemic control is improving.  He did not bring in his readings today.  No hypoglycemia.  Compliant with insulin and metformin.  Not yet due for A1c.  Patient reports that his wound is healing well.  Improving.  Has upcoming follow-up with orthopedics.  Patient reports improvement in anxiety after addition of BuSpar.  Patient Active Problem List   Diagnosis Date Noted   S/P AKA (above knee amputation), left (HCC) 07/31/2023   Amputation stump infection (HCC) 07/25/2023   Hepatic steatosis 03/25/2023   GERD (gastroesophageal reflux disease) 01/24/2023   History of osteomyelitis 10/12/2022   Uncontrolled type 2 diabetes mellitus with hyperglycemia (HCC) 10/12/2022   CAD (coronary artery disease) 10/12/2022   Encounter for therapeutic drug monitoring 10/12/2022   Dehiscence of amputation stump of left lower extremity (HCC)    Chronic anemia 02/13/2021   Anxiety 09/30/2020   Preventative health care 12/13/2019   S/P aortic valve replacement with mechanical valve 12/05/2019   Hyperlipidemia associated with type 2 diabetes mellitus (HCC) 10/12/2015   Class 3 obesity 10/12/2015   HTN (hypertension) 07/02/2013    Social Hx   Social History   Socioeconomic History   Marital status: Single    Spouse name: Not on file   Number of children: Not on file   Years of education: Not on file   Highest education level: Some college, no degree  Occupational History    Comment: Glass blower/designer- self-employed  Tobacco Use   Smoking status: Never   Smokeless tobacco: Former    Types: Chew    Quit date: 2021  Vaping Use   Vaping status: Never Used  Substance and  Sexual Activity   Alcohol use: Yes    Alcohol/week: 4.0 standard drinks of alcohol    Types: 4 Cans of beer per week    Comment: occasionally   Drug use: No   Sexual activity: Yes    Birth control/protection: None  Other Topics Concern   Not on file  Social History Narrative   Lives with his friend.    Social Drivers of Corporate investment banker Strain: Low Risk  (09/22/2023)   Overall Financial Resource Strain (CARDIA)    Difficulty of Paying Living Expenses: Not hard at all  Food Insecurity: No Food Insecurity (09/22/2023)   Hunger Vital Sign    Worried About Running Out of Food in the Last Year: Never true    Ran Out of Food in the Last Year: Never true  Transportation Needs: No Transportation Needs (09/22/2023)   PRAPARE - Administrator, Civil Service (Medical): No    Lack of Transportation (Non-Medical): No  Physical Activity: Inactive (09/22/2023)   Exercise Vital Sign    Days of Exercise per Week: 0 days    Minutes of Exercise per Session: 10 min  Stress: Stress Concern Present (09/22/2023)   [employer] of Occupational Health - Occupational Stress Questionnaire    Feeling of Stress : Very much  Social Connections: Socially Isolated (09/22/2023)   Social Connection and Isolation Panel [NHANES]    Frequency of Communication with Friends and  Family: More than three times a week    Frequency of Social Gatherings with Friends and Family: Twice a week    Attends Religious Services: Never    Database administrator or Organizations: No    Attends Engineer, structural: Not on file    Marital Status: Never married    Review of Systems Per HPI  Objective:  BP 134/70   Pulse 84   Temp (!) 97.3 F (36.3 C)   Ht 5\' 10"  (1.778 m)   SpO2 97%   BMI 49.41 kg/m      10/24/2023    9:05 AM 09/23/2023    1:06 PM 09/01/2023   11:44 AM  BP/Weight  Systolic BP 134 135 130  Diastolic BP 70 80 75  Wt. (Lbs) --  --    Physical Exam Vitals and  nursing note reviewed.  Constitutional:      General: He is not in acute distress.    Appearance: He is obese.  HENT:     Head: Normocephalic and atraumatic.  Eyes:     General:        Right eye: No discharge.        Left eye: No discharge.     Conjunctiva/sclera: Conjunctivae normal.  Cardiovascular:     Rate and Rhythm: Normal rate and regular rhythm.     Heart sounds: Murmur heard.  Pulmonary:     Effort: Pulmonary effort is normal.     Breath sounds: Normal breath sounds. No wheezing or rales.  Neurological:     Mental Status: He is alert.     Lab Results  Component Value Date   WBC 12.2 (H) 09/23/2023   HGB 8.3 (L) 09/23/2023   HCT 29.0 (L) 09/23/2023   PLT 415 (H) 09/23/2023   GLUCOSE 242 (H) 09/23/2023   CHOL 166 09/23/2023   TRIG 317 (H) 09/23/2023   HDL 32 (L) 09/23/2023   LDLCALC 92 09/23/2023   ALT 12 09/23/2023   AST 13 09/23/2023   NA 135 09/23/2023   K 4.6 09/23/2023   CL 100 09/23/2023   CREATININE 0.74 09/23/2023   BUN 14 09/23/2023   CO2 26 09/23/2023   TSH 2.520 04/27/2022   INR 1.5 (A) 10/19/2023   HGBA1C 10.2 (H) 07/26/2023   MICROALBUR 0.4 06/13/2020     Assessment & Plan:   Problem List Items Addressed This Visit       Cardiovascular and Mediastinum   HTN (hypertension)   Stable.  Continue metoprolol.        Endocrine   Uncontrolled type 2 diabetes mellitus with hyperglycemia (HCC) - Primary   Not due for an A1c.  A1c ordered.  Patient will follow-up in 1 month.  Continue Lantus and metformin.      Relevant Orders   Hemoglobin A1c   Microalbumin / creatinine urine ratio     Other   Anxiety   Improved on BuSpar.  Continue.       Follow-up:  1 month  Adrik Khim DO [facility]

## 2023-10-24 NOTE — Patient Instructions (Signed)
Labs after the 29th.  Follow up in 1 month (bring sugar log).  Take care  Dr. Adriana Simas

## 2023-10-24 NOTE — Assessment & Plan Note (Signed)
Not due for an A1c.  A1c ordered.  Patient will follow-up in 1 month.  Continue Lantus and metformin.

## 2023-10-24 NOTE — Assessment & Plan Note (Signed)
Improved on BuSpar.  Continue

## 2023-10-24 NOTE — Assessment & Plan Note (Signed)
--  Stable.  Continue metoprolol.

## 2023-10-25 ENCOUNTER — Ambulatory Visit (INDEPENDENT_AMBULATORY_CARE_PROVIDER_SITE_OTHER): Payer: Medicaid Other | Admitting: Family

## 2023-10-25 DIAGNOSIS — Z89612 Acquired absence of left leg above knee: Secondary | ICD-10-CM

## 2023-10-26 ENCOUNTER — Encounter: Payer: Self-pay | Admitting: Family

## 2023-10-26 NOTE — Progress Notes (Signed)
Post-Op Visit Note   Patient: Adam Long           Date of Birth: 07/18/1976           MRN: [id number] Visit Date: 10/25/2023 PCP: [name], DO  Chief Complaint:  Chief Complaint  Patient presents with   Left Leg - Routine Post Op    07/27/2023 left AKA    HPI:  HPI The patient is a 48 year old gentleman seen status post left above-knee amputation October 30. This has been complicated by a tunneling wound which he has been packing open with kerlix  They report is improving  Ortho Exam On examination of the left above-knee amputation there is 1 remaining area that hasn't healed this is 1 cm in diameter with granulation in the wound bed, this probes 3 cm deep does not probe to bone no drainage there is no sign of infection today  Visit Diagnoses: No diagnosis found.  Plan: Continue daily dial  soap cleansing. pack open with 4 x4 or kerlix. continue shrinker will follow-up in the office in 4 weeks  Follow-Up Instructions: Return in about 4 weeks (around 11/22/2023).   Imaging: No results found.  Orders:  No orders of the defined types were placed in this encounter.  No orders of the defined types were placed in this encounter.    PMFS History: Patient Active Problem List   Diagnosis Date Noted   S/P AKA (above knee amputation), left (HCC) 07/31/2023   Amputation stump infection (HCC) 07/25/2023   Hepatic steatosis 03/25/2023   GERD (gastroesophageal reflux disease) 01/24/2023   History of osteomyelitis 10/12/2022   Uncontrolled type 2 diabetes mellitus with hyperglycemia (HCC) 10/12/2022   CAD (coronary artery disease) 10/12/2022   Encounter for therapeutic drug monitoring 10/12/2022   Dehiscence of amputation stump of left lower extremity (HCC)    Chronic anemia 02/13/2021   Anxiety 09/30/2020   Preventative health care 12/13/2019   S/P aortic valve replacement with mechanical valve 12/05/2019   Hyperlipidemia associated with type 2 diabetes mellitus  (HCC) 10/12/2015   Class 3 obesity 10/12/2015   HTN (hypertension) 07/02/2013   Past Medical History:  Diagnosis Date   Anxiety    Aortic stenosis    Cellulitis and abscess of lower extremity 06/11/2019   Cellulitis of fourth toe of left foot    Cholelithiasis    Coronary artery disease    Nonobstructive CAD (40-50% LAD) 08/2019   Depression    Diabetes mellitus without complication (HCC)    Phreesia 09/27/2020   Elevated troponin level not due myocardial infarction 11/11/2019   Essential hypertension    Gangrene of toe of left foot (HCC) 07/06/2019   Heart murmur    Phreesia 09/27/2020   Hyperlipidemia    Phreesia 09/27/2020   Hypertension    Phreesia 09/27/2020   Mixed hyperlipidemia    Morbid obesity (HCC)    S/P aortic valve replacement with mechanical valve 12/05/2019   25 mm Carbomedics top hat bileaflet mechanical valve via partial upper hemi-sternotomy   Severe aortic stenosis 09/24/2019   Type 2 diabetes mellitus (HCC)     Family History  Problem Relation Age of Onset   Cancer Mother        Brain tumor   Heart disease Father    Hyperlipidemia Father    Hypertension Father    Stroke Father    Heart murmur Sister    Heart attack Maternal Grandmother    Liver disease Maternal Grandfather  Breast cancer Paternal Grandmother    COPD Paternal Grandfather    Heart attack Maternal Aunt    Colon cancer Maternal Uncle 67   Pancreatic cancer Paternal Aunt    Heart disease Paternal Uncle    Prostate cancer Neg Hx     Past Surgical History:  Procedure Laterality Date   ABDOMINAL AORTOGRAM W/LOWER EXTREMITY N/A 07/06/2019   Procedure: ABDOMINAL AORTOGRAM W/LOWER EXTREMITY;  Surgeon: Sherren Kerns, MD;  Location: [facility] INVASIVE CV LAB;  Service: Cardiovascular;  Laterality: N/A;  Bilateral   AMPUTATION Left 07/09/2019   Procedure: LEFT FOURTH and Fifth TOE AMPUTATION.;  Surgeon: Larina Earthly, MD;  Location: [facility] OR;  Service: Vascular;  Laterality: Left;    AMPUTATION Left 03/11/2021   Procedure: LEFT FOOT 5TH  AND 4TH RAY AMPUTATION;  Surgeon: Nadara Mustard, MD;  Location: [facility] OR;  Service: Orthopedics;  Laterality: Left;   AMPUTATION Left 08/28/2021   Procedure: AMPUTATION BELOW KNEE;  Surgeon: Nadara Mustard, MD;  Location: [facility] OR;  Service: Orthopedics;  Laterality: Left;   AMPUTATION Left 01/29/2023   Procedure: LEFT AMPUTATION BELOW KNEE REVISION AND CLOSURE;  Surgeon: Nadara Mustard, MD;  Location: [facility] OR;  Service: Orthopedics;  Laterality: Left;   AMPUTATION Left 07/27/2023   Procedure: LEFT ABOVE KNEE AMPUTATION;  Surgeon: Nadara Mustard, MD;  Location: [facility] OR;  Service: Orthopedics;  Laterality: Left;   AORTIC VALVE REPLACEMENT N/A 12/05/2019   Procedure: PARTIAL STERNOTOMY FOR AORTIC VALVE REPLACEMENT (AVR), USING CARBOMEDICS SUPRA-ANNULAR TOP HAT ;  Surgeon: Purcell Nails, MD;  Location: [facility] OR;  Service: Open Heart Surgery;  Laterality: N/A;  No neck lines on left   APPLICATION OF WOUND VAC Left 10/30/2021   Procedure: APPLICATION OF WOUND VAC;  Surgeon: Nadara Mustard, MD;  Location: [facility] OR;  Service: Orthopedics;  Laterality: Left;   APPLICATION OF WOUND VAC Left 12/25/2021   Procedure: APPLICATION OF WOUND VAC;  Surgeon: Nadara Mustard, MD;  Location: [facility] OR;  Service: Orthopedics;  Laterality: Left;   APPLICATION OF WOUND VAC Left 01/26/2023   Procedure: APPLICATION OF WOUND VAC TO LEFT STUMP;  Surgeon: Tarry Kos, MD;  Location: [facility] OR;  Service: Orthopedics;  Laterality: Left;   CARDIAC VALVE REPLACEMENT N/A    Phreesia 09/27/2020   I & D EXTREMITY Left 01/26/2023   Procedure: IRRIGATION AND DEBRIDEMENT OF LEFT BKA STUMP;  Surgeon: Tarry Kos, MD;  Location: [facility] OR;  Service: Orthopedics;  Laterality: Left;   IR RADIOLOGY PERIPHERAL GUIDED IV START  10/05/2019   IR US GUIDE VASC ACCESS RIGHT  10/05/2019   MULTIPLE EXTRACTIONS WITH ALVEOLOPLASTY N/A 10/26/2019   Procedure: EXTRACTION OF TOOTH #'S 3, 5-11,19-28,  AND 32 WITH ALVEOLOPLASTY;   Surgeon: Charlynne Pander, DDS;  Location: [facility] OR;  Service: Oral Surgery;  Laterality: N/A;   RIGHT HEART CATH AND CORONARY ANGIOGRAPHY N/A 09/24/2019   Procedure: RIGHT HEART CATH AND CORONARY ANGIOGRAPHY;  Surgeon: Tonny Bollman, MD;  Location: [facility] INVASIVE CV LAB;  Service: Cardiovascular;  Laterality: N/A;   STUMP REVISION Left 10/30/2021   Procedure: REVISION LEFT BELOW KNEE AMPUTATION;  Surgeon: Nadara Mustard, MD;  Location: [facility] OR;  Service: Orthopedics;  Laterality: Left;   STUMP REVISION Left 12/25/2021   Procedure: REVISION LEFT BELOW KNEE AMPUTATION;  Surgeon: Nadara Mustard, MD;  Location: [facility] OR;  Service: Orthopedics;  Laterality: Left;   TEE WITHOUT CARDIOVERSION N/A 12/05/2019   Procedure: TRANSESOPHAGEAL ECHOCARDIOGRAM (TEE);  Surgeon: Cornelius Moras,  Salvatore Decent, MD;  Location: [facility] OR;  Service: Open Heart Surgery;  Laterality: N/A;   TEE WITHOUT CARDIOVERSION N/A 09/01/2021   Procedure: TRANSESOPHAGEAL ECHOCARDIOGRAM (TEE);  Surgeon: Little Ishikawa, MD;  Location: [facility] ENDOSCOPY;  Service: Cardiovascular;  Laterality: N/A;   Social History   Occupational History    Comment: Glass blower/designer- self-employed  Tobacco Use   Smoking status: Never   Smokeless tobacco: Former    Types: Chew    Quit date: 2021  Vaping Use   Vaping status: Never Used  Substance and Sexual Activity   Alcohol use: Yes    Alcohol/week: 4.0 standard drinks of alcohol    Types: 4 Cans of beer per week    Comment: occasionally   Drug use: No   Sexual activity: Yes    Birth control/protection: None

## 2023-10-28 ENCOUNTER — Other Ambulatory Visit (HOSPITAL_COMMUNITY): Payer: Self-pay

## 2023-11-01 ENCOUNTER — Other Ambulatory Visit (HOSPITAL_COMMUNITY): Payer: Self-pay

## 2023-11-02 ENCOUNTER — Ambulatory Visit: Payer: Medicaid Other

## 2023-11-10 ENCOUNTER — Ambulatory Visit: Payer: Medicaid Other | Attending: Cardiology | Admitting: *Deleted

## 2023-11-10 DIAGNOSIS — Z5181 Encounter for therapeutic drug level monitoring: Secondary | ICD-10-CM

## 2023-11-10 DIAGNOSIS — Z954 Presence of other heart-valve replacement: Secondary | ICD-10-CM

## 2023-11-10 LAB — POCT INR: INR: 3.4 — AB (ref 2.0–3.0)

## 2023-11-10 NOTE — Patient Instructions (Signed)
Hold warfarin tonight then resume 1 tablet daily expcet for 1/2 tablet on Monday, Wednesday and Friday.  Recheck in 3 wks.

## 2023-11-20 ENCOUNTER — Other Ambulatory Visit: Payer: Self-pay | Admitting: Physical Medicine & Rehabilitation

## 2023-11-21 ENCOUNTER — Other Ambulatory Visit: Payer: Self-pay

## 2023-11-21 ENCOUNTER — Other Ambulatory Visit (HOSPITAL_COMMUNITY): Payer: Self-pay

## 2023-11-22 ENCOUNTER — Ambulatory Visit: Payer: Medicaid Other | Admitting: Family

## 2023-11-22 DIAGNOSIS — Z89612 Acquired absence of left leg above knee: Secondary | ICD-10-CM

## 2023-11-23 ENCOUNTER — Encounter: Payer: Self-pay | Admitting: Family

## 2023-11-23 NOTE — Progress Notes (Signed)
 Post-Op Visit Note   Patient: Adam Long           Date of Birth: May 20, 1976           MRN: [id number] Visit Date: 11/22/2023 PCP: [name], DO  Chief Complaint:  Chief Complaint  Patient presents with   Left Leg - Routine Post Op    07/27/2023 left AKA    HPI:  HPI The patient is a 48 year old gentleman seen status post left above-knee amputation October 30. This has been complicated by a tunneling wound which he has been packing open with gauze.  Using wound cleansers as well.  They report is gradually improving  Ortho Exam On examination of the left above-knee amputation there is 1 remaining area that hasn't healed this is 0.5 cm in diameter with granulation in the wound bed, this probes 1.5 cm deep does not probe to bone no drainage there is no sign of infection today  Visit Diagnoses: No diagnosis found.  Plan: Continue daily dial  soap cleansing. pack open with 4 x4 or kerlix may set this and Vashe.  Vashe provided today.. continue shrinker will follow-up in the office in 4 weeks  Have encouraged him to proceed with prosthesis set up.  Discussed this with his prosthetists at [facility] clinic.  Follow-Up Instructions: Return in about 6 weeks (around 01/03/2024).   Imaging: No results found.  Orders:  No orders of the defined types were placed in this encounter.  No orders of the defined types were placed in this encounter.    PMFS History: Patient Active Problem List   Diagnosis Date Noted   S/P AKA (above knee amputation), left (HCC) 07/31/2023   Amputation stump infection (HCC) 07/25/2023   Hepatic steatosis 03/25/2023   GERD (gastroesophageal reflux disease) 01/24/2023   History of osteomyelitis 10/12/2022   Uncontrolled type 2 diabetes mellitus with hyperglycemia (HCC) 10/12/2022   CAD (coronary artery disease) 10/12/2022   Encounter for therapeutic drug monitoring 10/12/2022   Dehiscence of amputation stump of left lower extremity (HCC)     Chronic anemia 02/13/2021   Anxiety 09/30/2020   Preventative health care 12/13/2019   S/P aortic valve replacement with mechanical valve 12/05/2019   Hyperlipidemia associated with type 2 diabetes mellitus (HCC) 10/12/2015   Class 3 obesity 10/12/2015   HTN (hypertension) 07/02/2013   Past Medical History:  Diagnosis Date   Anxiety    Aortic stenosis    Cellulitis and abscess of lower extremity 06/11/2019   Cellulitis of fourth toe of left foot    Cholelithiasis    Coronary artery disease    Nonobstructive CAD (40-50% LAD) 08/2019   Depression    Diabetes mellitus without complication (HCC)    Phreesia 09/27/2020   Elevated troponin level not due myocardial infarction 11/11/2019   Essential hypertension    Gangrene of toe of left foot (HCC) 07/06/2019   Heart murmur    Phreesia 09/27/2020   Hyperlipidemia    Phreesia 09/27/2020   Hypertension    Phreesia 09/27/2020   Mixed hyperlipidemia    Morbid obesity (HCC)    S/P aortic valve replacement with mechanical valve 12/05/2019   25 mm Carbomedics top hat bileaflet mechanical valve via partial upper hemi-sternotomy   Severe aortic stenosis 09/24/2019   Type 2 diabetes mellitus (HCC)     Family History  Problem Relation Age of Onset   Cancer Mother        Brain tumor   Heart disease Father  Hyperlipidemia Father    Hypertension Father    Stroke Father    Heart murmur Sister    Heart attack Maternal Grandmother    Liver disease Maternal Grandfather    Breast cancer Paternal Grandmother    COPD Paternal Grandfather    Heart attack Maternal Aunt    Colon cancer Maternal Uncle 67   Pancreatic cancer Paternal Aunt    Heart disease Paternal Uncle    Prostate cancer Neg Hx     Past Surgical History:  Procedure Laterality Date   ABDOMINAL AORTOGRAM W/LOWER EXTREMITY N/A 07/06/2019   Procedure: ABDOMINAL AORTOGRAM W/LOWER EXTREMITY;  Surgeon: Sherren Kerns, MD;  [facility]: [facility] INVASIVE CV LAB;  Service:  Cardiovascular;  Laterality: N/A;  Bilateral   AMPUTATION Left 07/09/2019   Procedure: LEFT FOURTH and Fifth TOE AMPUTATION.;  Surgeon: Larina Earthly, MD;  Location: [facility] OR;  Service: Vascular;  Laterality: Left;   AMPUTATION Left 03/11/2021   Procedure: LEFT FOOT 5TH  AND 4TH RAY AMPUTATION;  Surgeon: Nadara Mustard, MD;  Location: [facility] OR;  Service: Orthopedics;  Laterality: Left;   AMPUTATION Left 08/28/2021   Procedure: AMPUTATION BELOW KNEE;  Surgeon: Nadara Mustard, MD;  Location: [facility] OR;  Service: Orthopedics;  Laterality: Left;   AMPUTATION Left 01/29/2023   Procedure: LEFT AMPUTATION BELOW KNEE REVISION AND CLOSURE;  Surgeon: Nadara Mustard, MD;  Location: [facility] OR;  Service: Orthopedics;  Laterality: Left;   AMPUTATION Left 07/27/2023   Procedure: LEFT ABOVE KNEE AMPUTATION;  Surgeon: Nadara Mustard, MD;  Location: [facility] OR;  Service: Orthopedics;  Laterality: Left;   AORTIC VALVE REPLACEMENT N/A 12/05/2019   Procedure: PARTIAL STERNOTOMY FOR AORTIC VALVE REPLACEMENT (AVR), USING CARBOMEDICS SUPRA-ANNULAR TOP HAT ;  Surgeon: Purcell Nails, MD;  Location: [facility] OR;  Service: Open Heart Surgery;  Laterality: N/A;  No neck lines on left   APPLICATION OF WOUND VAC Left 10/30/2021   Procedure: APPLICATION OF WOUND VAC;  Surgeon: Nadara Mustard, MD;  Location: [facility] OR;  Service: Orthopedics;  Laterality: Left;   APPLICATION OF WOUND VAC Left 12/25/2021   Procedure: APPLICATION OF WOUND VAC;  Surgeon: Nadara Mustard, MD;  Location: [facility] OR;  Service: Orthopedics;  Laterality: Left;   APPLICATION OF WOUND VAC Left 01/26/2023   Procedure: APPLICATION OF WOUND VAC TO LEFT STUMP;  Surgeon: Tarry Kos, MD;  Location: [facility] OR;  Service: Orthopedics;  Laterality: Left;   CARDIAC VALVE REPLACEMENT N/A    Phreesia 09/27/2020   I & D EXTREMITY Left 01/26/2023   Procedure: IRRIGATION AND DEBRIDEMENT OF LEFT BKA STUMP;  Surgeon: Tarry Kos, MD;  Location: [facility] OR;  Service: Orthopedics;  Laterality: Left;   IR RADIOLOGY  PERIPHERAL GUIDED IV START  10/05/2019   IR US GUIDE VASC ACCESS RIGHT  10/05/2019   MULTIPLE EXTRACTIONS WITH ALVEOLOPLASTY N/A 10/26/2019   Procedure: EXTRACTION OF TOOTH #'S 3, 5-11,19-28,  AND 32 WITH ALVEOLOPLASTY;  Surgeon: Charlynne Pander, DDS;  Location: [facility] OR;  Service: Oral Surgery;  Laterality: N/A;   RIGHT HEART CATH AND CORONARY ANGIOGRAPHY N/A 09/24/2019   Procedure: RIGHT HEART CATH AND CORONARY ANGIOGRAPHY;  Surgeon: Tonny Bollman, MD;  Location: [facility] INVASIVE CV LAB;  Service: Cardiovascular;  Laterality: N/A;   STUMP REVISION Left 10/30/2021   Procedure: REVISION LEFT BELOW KNEE AMPUTATION;  Surgeon: Nadara Mustard, MD;  Location: [facility] OR;  Service: Orthopedics;  Laterality: Left;   STUMP REVISION Left 12/25/2021   Procedure: REVISION LEFT  BELOW KNEE AMPUTATION;  Surgeon: Nadara Mustard, MD;  Location: [facility] OR;  Service: Orthopedics;  Laterality: Left;   TEE WITHOUT CARDIOVERSION N/A 12/05/2019   Procedure: TRANSESOPHAGEAL ECHOCARDIOGRAM (TEE);  Surgeon: Purcell Nails, MD;  Location: [facility] OR;  Service: Open Heart Surgery;  Laterality: N/A;   TEE WITHOUT CARDIOVERSION N/A 09/01/2021   Procedure: TRANSESOPHAGEAL ECHOCARDIOGRAM (TEE);  Surgeon: Little Ishikawa, MD;  Location: [facility] ENDOSCOPY;  Service: Cardiovascular;  Laterality: N/A;   Social History   Occupational History    Comment: Glass blower/designer- self-employed  Tobacco Use   Smoking status: Never   Smokeless tobacco: Former    Types: Chew    Quit date: 2021  Vaping Use   Vaping status: Never Used  Substance and Sexual Activity   Alcohol use: Yes    Alcohol/week: 4.0 standard drinks of alcohol    Types: 4 Cans of beer per week    Comment: occasionally   Drug use: No   Sexual activity: Yes    Birth control/protection: None

## 2023-11-24 ENCOUNTER — Ambulatory Visit: Payer: Medicaid Other | Admitting: Family Medicine

## 2023-11-30 ENCOUNTER — Ambulatory Visit: Payer: Medicaid Other

## 2023-12-05 ENCOUNTER — Encounter

## 2023-12-07 ENCOUNTER — Other Ambulatory Visit (HOSPITAL_COMMUNITY): Payer: Self-pay

## 2023-12-12 ENCOUNTER — Other Ambulatory Visit: Payer: Self-pay | Admitting: Physical Medicine & Rehabilitation

## 2023-12-12 ENCOUNTER — Other Ambulatory Visit (HOSPITAL_COMMUNITY): Payer: Self-pay

## 2023-12-12 ENCOUNTER — Other Ambulatory Visit: Payer: Self-pay

## 2023-12-13 ENCOUNTER — Other Ambulatory Visit (HOSPITAL_COMMUNITY): Payer: Self-pay

## 2023-12-13 ENCOUNTER — Other Ambulatory Visit: Payer: Self-pay

## 2023-12-13 DIAGNOSIS — I10 Essential (primary) hypertension: Secondary | ICD-10-CM

## 2023-12-13 DIAGNOSIS — D649 Anemia, unspecified: Secondary | ICD-10-CM

## 2023-12-13 DIAGNOSIS — E1165 Type 2 diabetes mellitus with hyperglycemia: Secondary | ICD-10-CM

## 2023-12-13 DIAGNOSIS — E785 Hyperlipidemia, unspecified: Secondary | ICD-10-CM

## 2023-12-13 MED ORDER — GABAPENTIN 400 MG PO CAPS
800.0000 mg | ORAL_CAPSULE | Freq: Three times a day (TID) | ORAL | 1 refills | Status: DC
  Filled 2023-12-13: qty 540, 90d supply, fill #0
  Filled 2024-03-10: qty 540, 90d supply, fill #1

## 2023-12-13 MED ORDER — METFORMIN HCL ER (MOD) 1000 MG PO TB24
1000.0000 mg | ORAL_TABLET | Freq: Two times a day (BID) | ORAL | 1 refills | Status: DC
  Filled 2023-12-13 – 2023-12-21 (×2): qty 180, 90d supply, fill #0

## 2023-12-14 ENCOUNTER — Encounter: Payer: Self-pay | Admitting: Family Medicine

## 2023-12-14 ENCOUNTER — Other Ambulatory Visit: Payer: Self-pay

## 2023-12-14 DIAGNOSIS — E1165 Type 2 diabetes mellitus with hyperglycemia: Secondary | ICD-10-CM

## 2023-12-14 DIAGNOSIS — Z1211 Encounter for screening for malignant neoplasm of colon: Secondary | ICD-10-CM

## 2023-12-14 DIAGNOSIS — D649 Anemia, unspecified: Secondary | ICD-10-CM

## 2023-12-14 LAB — CBC WITH DIFFERENTIAL/PLATELET
Absolute Lymphocytes: 1813 {cells}/uL (ref 850–3900)
Absolute Monocytes: 480 {cells}/uL (ref 200–950)
Basophils Absolute: 59 {cells}/uL (ref 0–200)
Basophils Relative: 0.6 %
Eosinophils Absolute: 88 {cells}/uL (ref 15–500)
Eosinophils Relative: 0.9 %
HCT: 32.5 % — ABNORMAL LOW (ref 38.5–50.0)
Hemoglobin: 8.9 g/dL — ABNORMAL LOW (ref 13.2–17.1)
MCH: 18.4 pg — ABNORMAL LOW (ref 27.0–33.0)
MCHC: 27.4 g/dL — ABNORMAL LOW (ref 32.0–36.0)
MCV: 67 fL — ABNORMAL LOW (ref 80.0–100.0)
MPV: 9.4 fL (ref 7.5–12.5)
Monocytes Relative: 4.9 %
Neutro Abs: 7360 {cells}/uL (ref 1500–7800)
Neutrophils Relative %: 75.1 %
Platelets: 313 10*3/uL (ref 140–400)
RBC: 4.85 10*6/uL (ref 4.20–5.80)
RDW: 19.2 % — ABNORMAL HIGH (ref 11.0–15.0)
Total Lymphocyte: 18.5 %
WBC: 9.8 10*3/uL (ref 3.8–10.8)

## 2023-12-14 LAB — LIPID PANEL
Cholesterol: 170 mg/dL (ref <200)
HDL: 41 mg/dL (ref >=40)
LDL Cholesterol (Calc): 87 mg/dL
Non-HDL Cholesterol (Calc): 129 mg/dL (ref <130)
Total CHOL/HDL Ratio: 4.1 (calc) (ref <5.0)
Triglycerides: 349 mg/dL — ABNORMAL HIGH (ref <150)

## 2023-12-14 LAB — IRON,TIBC AND FERRITIN PANEL
%SAT: 4 % — ABNORMAL LOW (ref 20–48)
Ferritin: 14 ng/mL — ABNORMAL LOW (ref 38–380)
Iron: 19 ug/dL — ABNORMAL LOW (ref 50–180)
TIBC: 423 ug/dL (ref 250–425)

## 2023-12-14 LAB — MICROALBUMIN / CREATININE URINE RATIO
Creatinine, Urine: 115 mg/dL (ref 20–320)
Microalb Creat Ratio: 104 mg/g{creat} — ABNORMAL HIGH (ref <30)
Microalb, Ur: 12 mg/dL

## 2023-12-14 LAB — HEMOGLOBIN A1C
Hgb A1c MFr Bld: 10.7 %{Hb} — ABNORMAL HIGH (ref <5.7)
Mean Plasma Glucose: 260 mg/dL
eAG (mmol/L): 14.4 mmol/L

## 2023-12-14 LAB — CBC MORPHOLOGY

## 2023-12-15 ENCOUNTER — Encounter: Payer: Self-pay | Admitting: *Deleted

## 2023-12-17 ENCOUNTER — Other Ambulatory Visit (HOSPITAL_COMMUNITY): Payer: Self-pay

## 2023-12-18 NOTE — Progress Notes (Deleted)
 Referring Provider: [name], DO Primary Care Physician:  [name], DO Primary GI Physician: Dr. Marletta Lor  No chief complaint on file.   HPI:   Adam Long is a 48 y.o. male with history of T2DM, left AKA 06/2023, AS s/p mechanical valve replacement chronically anticoagulated on warfarin, CAD, HTN, HLD, GERD, IDA,  presenting today to discuss scheduling colonoscopy.    FOBT positive in November 2024.  Most recent Hgb 8.9 on 12/13/23, iron 19, saturation 4%, ferritin 14.    Today:    Past Medical History:  Diagnosis Date   Anxiety    Aortic stenosis    Cellulitis and abscess of lower extremity 06/11/2019   Cellulitis of fourth toe of left foot    Cholelithiasis    Coronary artery disease    Nonobstructive CAD (40-50% LAD) 08/2019   Depression    Diabetes mellitus without complication (HCC)    Phreesia 09/27/2020   Elevated troponin level not due myocardial infarction 11/11/2019   Essential hypertension    Gangrene of toe of left foot (HCC) 07/06/2019   Heart murmur    Phreesia 09/27/2020   Hyperlipidemia    Phreesia 09/27/2020   Hypertension    Phreesia 09/27/2020   Mixed hyperlipidemia    Morbid obesity (HCC)    S/P aortic valve replacement with mechanical valve 12/05/2019   25 mm Carbomedics top hat bileaflet mechanical valve via partial upper hemi-sternotomy   Severe aortic stenosis 09/24/2019   Type 2 diabetes mellitus (HCC)     Past Surgical History:  Procedure Laterality Date   ABDOMINAL AORTOGRAM W/LOWER EXTREMITY N/A 07/06/2019   Procedure: ABDOMINAL AORTOGRAM W/LOWER EXTREMITY;  Surgeon: Sherren Kerns, MD;  [facility]: [facility];  Service: Cardiovascular;  Laterality: N/A;  Bilateral   AMPUTATION Left 07/09/2019   Procedure: LEFT FOURTH and Fifth TOE AMPUTATION.;  Surgeon: Larina Earthly, MD;  Location: [facility] OR;  Service: Vascular;  Laterality: Left;   AMPUTATION Left 03/11/2021   Procedure: LEFT FOOT 5TH  AND 4TH RAY AMPUTATION;   Surgeon: Nadara Mustard, MD;  Location: [facility] OR;  Service: Orthopedics;  Laterality: Left;   AMPUTATION Left 08/28/2021   Procedure: AMPUTATION BELOW KNEE;  Surgeon: Nadara Mustard, MD;  Location: [facility] OR;  Service: Orthopedics;  Laterality: Left;   AMPUTATION Left 01/29/2023   Procedure: LEFT AMPUTATION BELOW KNEE REVISION AND CLOSURE;  Surgeon: Nadara Mustard, MD;  Location: [facility] OR;  Service: Orthopedics;  Laterality: Left;   AMPUTATION Left 07/27/2023   Procedure: LEFT ABOVE KNEE AMPUTATION;  Surgeon: Nadara Mustard, MD;  Location: [facility] OR;  Service: Orthopedics;  Laterality: Left;   AORTIC VALVE REPLACEMENT N/A 12/05/2019   Procedure: PARTIAL STERNOTOMY FOR AORTIC VALVE REPLACEMENT (AVR), USING CARBOMEDICS SUPRA-ANNULAR TOP HAT ;  Surgeon: Purcell Nails, MD;  Location: [facility] OR;  Service: Open Heart Surgery;  Laterality: N/A;  No neck lines on left   APPLICATION OF WOUND VAC Left 10/30/2021   Procedure: APPLICATION OF WOUND VAC;  Surgeon: Nadara Mustard, MD;  [facility] OR;  Service: Orthopedics;  Laterality: Left;   APPLICATION OF WOUND VAC Left 12/25/2021   Procedure: APPLICATION OF WOUND VAC;  Surgeon: Nadara Mustard, MD;  Location: [facility] OR;  Service: Orthopedics;  Laterality: Left;   APPLICATION OF WOUND VAC Left 01/26/2023   Procedure: APPLICATION OF WOUND VAC TO LEFT STUMP;  Surgeon: Tarry Kos, MD;  Location[facility] OR;  Service: Orthopedics;  Laterality: Left;  CARDIAC VALVE REPLACEMENT N/A    Phreesia 09/27/2020   I & D EXTREMITY Left 01/26/2023   Procedure: IRRIGATION AND DEBRIDEMENT OF LEFT BKA STUMP;  Surgeon: Tarry Kos, MD;  Location: [facility] OR;  Service: Orthopedics;  Laterality: Left;   IR RADIOLOGY PERIPHERAL GUIDED IV START  10/05/2019   IR US GUIDE VASC ACCESS RIGHT  10/05/2019   MULTIPLE EXTRACTIONS WITH ALVEOLOPLASTY N/A 10/26/2019   Procedure: EXTRACTION OF TOOTH #'S 3, 5-11,19-28,  AND 32 WITH ALVEOLOPLASTY;  Surgeon: Charlynne Pander, DDS;  Location: [facility] OR;  Service: Oral Surgery;   Laterality: N/A;   RIGHT HEART CATH AND CORONARY ANGIOGRAPHY N/A 09/24/2019   Procedure: RIGHT HEART CATH AND CORONARY ANGIOGRAPHY;  Surgeon: Tonny Bollman, MD;  Location: [facility] INVASIVE CV LAB;  Service: Cardiovascular;  Laterality: N/A;   STUMP REVISION Left 10/30/2021   Procedure: REVISION LEFT BELOW KNEE AMPUTATION;  Surgeon: Nadara Mustard, MD;  Location: [facility] OR;  Service: Orthopedics;  Laterality: Left;   STUMP REVISION Left 12/25/2021   Procedure: REVISION LEFT BELOW KNEE AMPUTATION;  Surgeon: Nadara Mustard, MD;  Location: [facility] OR;  Service: Orthopedics;  Laterality: Left;   TEE WITHOUT CARDIOVERSION N/A 12/05/2019   Procedure: TRANSESOPHAGEAL ECHOCARDIOGRAM (TEE);  Surgeon: Purcell Nails, MD;  Location: [facility] OR;  Service: Open Heart Surgery;  Laterality: N/A;   TEE WITHOUT CARDIOVERSION N/A 09/01/2021   Procedure: TRANSESOPHAGEAL ECHOCARDIOGRAM (TEE);  Surgeon: Little Ishikawa, MD;  Location: [facility] ENDOSCOPY;  Service: Cardiovascular;  Laterality: N/A;    Current Outpatient Medications  Medication Sig Dispense Refill   ascorbic acid (VITAMIN C) 1000 MG tablet Take 1 tablet (1,000 mg total) by mouth daily. 30 tablet 0   busPIRone (BUSPAR) 7.5 MG tablet Take 1 tablet (7.5 mg total) by mouth 2 (two) times daily. 60 tablet 3   doxycycline (VIBRA-TABS) 100 MG tablet Take 1 tablet (100 mg total) by mouth 2 (two) times daily. 14 tablet 0   ezetimibe (ZETIA) 10 MG tablet Take 1 tablet (10 mg total) by mouth daily. 30 tablet 0   gabapentin (NEURONTIN) 400 MG capsule Take 2 capsules (800 mg total) by mouth 3 (three) times daily. 540 capsule 1   insulin glargine (LANTUS SOLOSTAR) 100 UNIT/ML Solostar Pen Inject 38 Units into the skin daily. 15 mL 11   Insulin Pen Needle (BD PEN NEEDLE NANO U/F) 32G X 4 MM MISC Use as directed daily. 100 each 0   leptospermum manuka honey (MEDIHONEY) PSTE paste Apply thin layer to wound daily 44 mL 0   metFORMIN (GLUMETZA) 1000 MG (MOD) 24 hr tablet Take 1 tablet  (1,000 mg total) by mouth 2 (two) times daily with a meal. 180 tablet 1   methocarbamol (ROBAXIN) 500 MG tablet Take 1 tablet (500 mg total) by mouth every 6 (six) hours as needed for muscle spasms. 60 tablet 0   metoprolol tartrate (LOPRESSOR) 25 MG tablet Take 1 tablet (25 mg total) by mouth 2 (two) times daily. 60 tablet 0   oxyCODONE (OXY IR/ROXICODONE) 5 MG immediate release tablet Take 1 tablet (5 mg total) by mouth every 6 (six) hours as needed for moderate pain (pain score 4-6) (pain score 4-6). 20 tablet 0   pantoprazole (PROTONIX) 40 MG tablet Take 1 tablet (40 mg total) by mouth daily. 30 tablet 0   rosuvastatin (CRESTOR) 10 MG tablet Take 1 tablet (10 mg total) by mouth daily. 90 tablet 3   sertraline (ZOLOFT) 50 MG tablet Take 1 tablet (50 mg  total) by mouth daily. 30 tablet 0   warfarin (COUMADIN) 10 MG tablet Take 1 tablet (10 mg total) by mouth daily at 4 PM. 30 tablet 0   Zinc Sulfate 220 (50 Zn) MG TABS Take 1 tablet (220 mg total) by mouth daily. 30 tablet 0   No current facility-administered medications for this visit.    Allergies as of 12/21/2023   (No Known Allergies)    Family History  Problem Relation Age of Onset   Cancer Mother        Brain tumor   Heart disease Father    Hyperlipidemia Father    Hypertension Father    Stroke Father    Heart murmur Sister    Heart attack Maternal Grandmother    Liver disease Maternal Grandfather    Breast cancer Paternal Grandmother    COPD Paternal Grandfather    Heart attack Maternal Aunt    Colon cancer Maternal Uncle 67   Pancreatic cancer Paternal Aunt    Heart disease Paternal Uncle    Prostate cancer Neg Hx     Social History   Socioeconomic History   Marital status: Single    Spouse name: Not on file   Number of children: Not on file   Years of education: Not on file   Highest education level: Some college, no degree  Occupational History    Comment: Glass blower/designer- self-employed  Tobacco Use    Smoking status: Never   Smokeless tobacco: Former    Types: Chew    Quit date: 2021  Vaping Use   Vaping status: Never Used  Substance and Sexual Activity   Alcohol use: Yes    Alcohol/week: 4.0 standard drinks of alcohol    Types: 4 Cans of beer per week    Comment: occasionally   Drug use: No   Sexual activity: Yes    Birth control/protection: None  Other Topics Concern   Not on file  Social History Narrative   Lives with his friend.    Social Drivers of Corporate investment banker Strain: Low Risk  (09/22/2023)   Overall Financial Resource Strain (CARDIA)    Difficulty of Paying Living Expenses: Not hard at all  Food Insecurity: No Food Insecurity (09/22/2023)   Hunger Vital Sign    Worried About Running Out of Food in the Last Year: Never true    Ran Out of Food in the Last Year: Never true  Transportation Needs: No Transportation Needs (09/22/2023)   PRAPARE - Administrator, Civil Service (Medical): No    Lack of Transportation (Non-Medical): No  Physical Activity: Inactive (09/22/2023)   Exercise Vital Sign    Days of Exercise per Week: 0 days    Minutes of Exercise per Session: 10 min  Stress: Stress Concern Present (09/22/2023)   [employer] of Occupational Health - Occupational Stress Questionnaire    Feeling of Stress : Very much  Social Connections: Socially Isolated (09/22/2023)   Social Connection and Isolation Panel [NHANES]    Frequency of Communication with Friends and Family: More than three times a week    Frequency of Social Gatherings with Friends and Family: Twice a week    Attends Religious Services: Never    Database administrator or Organizations: No    Attends Engineer, structural: Not on file    Marital Status: Never married    Review of Systems: Gen: Denies fever, chills, anorexia. Denies fatigue, weakness, weight loss.  CV: Denies chest pain, palpitations, syncope, peripheral edema, and claudication. Resp:  Denies dyspnea at rest, cough, wheezing, coughing up blood, and pleurisy. GI: Denies vomiting blood, jaundice, and fecal incontinence.   Denies dysphagia or odynophagia. Derm: Denies rash, itching, dry skin Psych: Denies depression, anxiety, memory loss, confusion. No homicidal or suicidal ideation.  Heme: Denies bruising, bleeding, and enlarged lymph nodes.  Physical Exam: There were no vitals taken for this visit. General:   Alert and oriented. No distress noted. Pleasant and cooperative.  Head:  Normocephalic and atraumatic. Eyes:  Conjuctiva clear without scleral icterus. Heart:  S1, S2 present without murmurs appreciated. Lungs:  Clear to auscultation bilaterally. No wheezes, rales, or rhonchi. No distress.  Abdomen:  +BS, soft, non-tender and non-distended. No rebound or guarding. No HSM or masses noted. Msk:  Symmetrical without gross deformities. Normal posture. Extremities:  Without edema. Neurologic:  Alert and  oriented x4 Psych:  Normal mood and affect.    Assessment:     Plan:  ***   Ermalinda Memos, PA-C [facility] 12/21/2023

## 2023-12-20 ENCOUNTER — Ambulatory Visit: Payer: Medicaid Other | Attending: Cardiology | Admitting: Cardiology

## 2023-12-20 ENCOUNTER — Encounter: Payer: Self-pay | Admitting: Cardiology

## 2023-12-21 ENCOUNTER — Encounter: Payer: Self-pay | Admitting: Gastroenterology

## 2023-12-21 ENCOUNTER — Ambulatory Visit: Admitting: Gastroenterology

## 2023-12-21 ENCOUNTER — Other Ambulatory Visit: Payer: Self-pay | Admitting: Family Medicine

## 2023-12-21 ENCOUNTER — Ambulatory Visit (INDEPENDENT_AMBULATORY_CARE_PROVIDER_SITE_OTHER): Admitting: Family Medicine

## 2023-12-21 ENCOUNTER — Other Ambulatory Visit: Payer: Self-pay | Admitting: Physical Medicine & Rehabilitation

## 2023-12-21 VITALS — BP 157/90 | HR 87 | Temp 98.4°F | Ht 70.0 in

## 2023-12-21 DIAGNOSIS — Z954 Presence of other heart-valve replacement: Secondary | ICD-10-CM

## 2023-12-21 DIAGNOSIS — E1165 Type 2 diabetes mellitus with hyperglycemia: Secondary | ICD-10-CM | POA: Diagnosis not present

## 2023-12-21 DIAGNOSIS — D509 Iron deficiency anemia, unspecified: Secondary | ICD-10-CM | POA: Diagnosis not present

## 2023-12-21 DIAGNOSIS — E1169 Type 2 diabetes mellitus with other specified complication: Secondary | ICD-10-CM

## 2023-12-21 NOTE — Patient Instructions (Signed)
 Referrals are in.  Be sure to wear the continuous glucometer.  Follow up in 3 months.

## 2023-12-22 ENCOUNTER — Other Ambulatory Visit: Payer: Self-pay

## 2023-12-22 ENCOUNTER — Encounter: Payer: Self-pay | Admitting: *Deleted

## 2023-12-22 ENCOUNTER — Other Ambulatory Visit (HOSPITAL_COMMUNITY): Payer: Self-pay

## 2023-12-22 DIAGNOSIS — E1169 Type 2 diabetes mellitus with other specified complication: Secondary | ICD-10-CM

## 2023-12-22 DIAGNOSIS — D509 Iron deficiency anemia, unspecified: Secondary | ICD-10-CM | POA: Insufficient documentation

## 2023-12-22 LAB — PROTIME-INR
INR: 2.2 — ABNORMAL HIGH
Prothrombin Time: 22.4 s — ABNORMAL HIGH (ref 9.0–11.5)

## 2023-12-22 MED ORDER — EZETIMIBE 10 MG PO TABS: 10.0000 mg | ORAL_TABLET | Freq: Every day | ORAL | 6 refills | Status: AC

## 2023-12-22 NOTE — Assessment & Plan Note (Signed)
 Patient wanted his INR checked today as he missed his previous appointment.  Will obtain and will forward results to the Coumadin clinic.

## 2023-12-22 NOTE — Assessment & Plan Note (Signed)
 After discussion today, patient elected to be referred to hematology for IV iron.  Also needs colonoscopy.  Referring to GI.

## 2023-12-22 NOTE — Progress Notes (Signed)
 Subjective:  Patient ID: Adam Long, male    DOB: 1976/05/08  Age: 48 y.o. MRN: [id number]  CC:  Follow up   HPI:  48 year old male with an extensive past medical history including coronary disease, hypertension, GERD, hepatic steatosis, morbid obesity, uncontrolled type 2 diabetes with complications, hyperlipidemia, chronic anticoagulation secondary to aortic valve replacement presents for follow-up.  Patient reports that he recently lost his father.  This has worsened depression and anxiety.  PHQ-9 score 17.  GAD-7 score of 17.  Diabetes remains uncontrolled.  A1c returned at 10.7.  Will discuss patient seeing endocrinology.  He is on metformin and long-acting insulin.  He is not wearing his continuous glucometer as she should.  He has seen endocrinology locally in the past.  Additionally, labs revealed iron deficiency anemia.  Patient is on chronic anticoagulation.  Will discuss treatment and also referral.  Patient Active Problem List   Diagnosis Date Noted   Iron deficiency anemia 12/22/2023   S/P AKA (above knee amputation), left (HCC) 07/31/2023   Hepatic steatosis 03/25/2023   GERD (gastroesophageal reflux disease) 01/24/2023   History of osteomyelitis 10/12/2022   Uncontrolled type 2 diabetes mellitus with hyperglycemia (HCC) 10/12/2022   CAD (coronary artery disease) 10/12/2022   Encounter for therapeutic drug monitoring 10/12/2022   Anxiety 09/30/2020   S/P aortic valve replacement with mechanical valve 12/05/2019   Hyperlipidemia associated with type 2 diabetes mellitus (HCC) 10/12/2015   Class 3 obesity 10/12/2015   HTN (hypertension) 07/02/2013    Social Hx   Social History   Socioeconomic History   Marital status: Single    Spouse name: Not on file   Number of children: Not on file   Years of education: Not on file   Highest education level: Some college, no degree  Occupational History    Comment: Glass blower/designer- self-employed  Tobacco Use    Smoking status: Never   Smokeless tobacco: Former    Types: Chew    Quit date: 2021  Vaping Use   Vaping status: Never Used  Substance and Sexual Activity   Alcohol use: Yes    Alcohol/week: 4.0 standard drinks of alcohol    Types: 4 Cans of beer per week    Comment: occasionally   Drug use: No   Sexual activity: Yes    Birth control/protection: None  Other Topics Concern   Not on file  Social History Narrative   Lives with his friend.    Social Drivers of Corporate investment banker Strain: Low Risk  (09/22/2023)   Overall Financial Resource Strain (CARDIA)    Difficulty of Paying Living Expenses: Not hard at all  Food Insecurity: No Food Insecurity (09/22/2023)   Hunger Vital Sign    Worried About Running Out of Food in the Last Year: Never true    Ran Out of Food in the Last Year: Never true  Transportation Needs: No Transportation Needs (09/22/2023)   PRAPARE - Administrator, Civil Service (Medical): No    Lack of Transportation (Non-Medical): No  Physical Activity: Inactive (09/22/2023)   Exercise Vital Sign    Days of Exercise per Week: 0 days    Minutes of Exercise per Session: 10 min  Stress: Stress Concern Present (09/22/2023)   [employer] of Occupational Health - Occupational Stress Questionnaire    Feeling of Stress : Very much  Social Connections: Socially Isolated (09/22/2023)   Social Connection and Isolation Panel [NHANES]    Frequency of  Communication with Friends and Family: More than three times a week    Frequency of Social Gatherings with Friends and Family: Twice a week    Attends Religious Services: Never    Database administrator or Organizations: No    Attends Engineer, structural: Not on file    Marital Status: Never married    Review of Systems Per HPI  Objective:  BP (!) 157/90   Pulse 87   Temp 98.4 F (36.9 C)   Ht 5\' 10"  (1.778 m)   SpO2 99%   BMI 49.41 kg/m      12/21/2023   11:12 AM 12/21/2023    10:29 AM 10/24/2023    9:05 AM  BP/Weight  Systolic BP 157 153 134  Diastolic BP 90 83 70  Wt. (Lbs)   --    Physical Exam Vitals and nursing note reviewed.  Constitutional:      General: He is not in acute distress.    Appearance: Normal appearance. He is obese.  HENT:     Head: Normocephalic and atraumatic.  Eyes:     General:        Right eye: No discharge.        Left eye: No discharge.     Conjunctiva/sclera: Conjunctivae normal.  Cardiovascular:     Rate and Rhythm: Normal rate and regular rhythm.     Comments: Murmur secondary to mechanical valve. Pulmonary:     Effort: Pulmonary effort is normal.     Breath sounds: Normal breath sounds. No wheezing, rhonchi or rales.  Neurological:     Mental Status: He is alert.  Psychiatric:        Mood and Affect: Mood normal.        Behavior: Behavior normal.     Lab Results  Component Value Date   WBC 9.8 12/13/2023   HGB 8.9 (L) 12/13/2023   HCT 32.5 (L) 12/13/2023   PLT 313 12/13/2023   GLUCOSE 242 (H) 09/23/2023   CHOL 170 12/13/2023   TRIG 349 (H) 12/13/2023   HDL 41 12/13/2023   LDLCALC 87 12/13/2023   ALT 12 09/23/2023   AST 13 09/23/2023   NA 135 09/23/2023   K 4.6 09/23/2023   CL 100 09/23/2023   CREATININE 0.74 09/23/2023   BUN 14 09/23/2023   CO2 26 09/23/2023   TSH 2.520 04/27/2022   INR 2.2 (H) 12/21/2023   HGBA1C 10.7 (H) 12/13/2023   MICROALBUR 12.0 12/13/2023     Assessment & Plan:  Iron deficiency anemia, unspecified iron deficiency anemia type Assessment & Plan: After discussion today, patient elected to be referred to hematology for IV iron.  Also needs colonoscopy.  Referring to GI.  Orders: -     Ambulatory referral to Gastroenterology -     Ambulatory referral to Hematology / Oncology  Uncontrolled type 2 diabetes mellitus with hyperglycemia (HCC) Assessment & Plan: Advised compliance with continuous glucometer.  Referring to endocrinology.  Orders: -     Ambulatory referral to  Endocrinology  S/P aortic valve replacement with mechanical valve Assessment & Plan: Patient wanted his INR checked today as he missed his previous appointment.  Will obtain and will forward results to the Coumadin clinic.  Orders: -     Protime-INR    Follow-up:  3 months  Shatha Hooser Adriana Simas DO [facility]

## 2023-12-22 NOTE — Assessment & Plan Note (Signed)
 Advised compliance with continuous glucometer.  Referring to endocrinology.

## 2023-12-23 ENCOUNTER — Other Ambulatory Visit (HOSPITAL_COMMUNITY): Payer: Self-pay

## 2023-12-23 ENCOUNTER — Other Ambulatory Visit: Payer: Self-pay

## 2023-12-26 ENCOUNTER — Inpatient Hospital Stay: Attending: Oncology | Admitting: Oncology

## 2023-12-26 VITALS — BP 151/79 | HR 94 | Temp 98.1°F | Resp 20 | Ht 70.0 in | Wt 340.0 lb

## 2023-12-26 DIAGNOSIS — D509 Iron deficiency anemia, unspecified: Secondary | ICD-10-CM | POA: Diagnosis present

## 2023-12-26 MED ORDER — VITAMIN B-12 1000 MCG PO TABS: 1000.0000 ug | ORAL_TABLET | Freq: Every day | ORAL | 2 refills | Status: AC

## 2023-12-26 MED ORDER — FERROUS SULFATE 325 (65 FE) MG PO TBEC: 325.0000 mg | DELAYED_RELEASE_TABLET | ORAL | 3 refills | Status: AC

## 2023-12-26 NOTE — Assessment & Plan Note (Signed)
 Patient currently has iron deficiency anemia-unknown source.  Labs consistent with severe iron deficiency.  Patient is on metformin and had previous borderline low vitamin B12 levels. -The most likely cause of his anemia is due to chronic blood loss or malabsorption syndrome. We discussed some of the risks, benefits, and alternatives of intravenous iron infusions. The patient is symptomatic from anemia and the iron level is critically low. He desires to achieve higher levels of iron faster for adequate hematopoesis. Some of the side-effects to be expected including risks of infusion reactions, phlebitis, headaches, nausea and fatigue.  The patient is willing to proceed. Patient education material was dispensed.  Goal is to keep ferritin level greater than 50 and resolution of anemia  - Start oral iron supplementation every other day to minimize constipation. - Start over-the-counter vitamin B12 supplementation, 1000 micrograms daily. - Schedule and complete colonoscopy and endoscopy with GI specialists to investigate potential sources of gastrointestinal bleeding.  Appointment on 12/30/2023.  Return to clinic in 6 weeks with labs to assess the response to IV iron and discuss further management.

## 2023-12-26 NOTE — Patient Instructions (Signed)
 VISIT SUMMARY:  During your visit, we discussed your ongoing fatigue and lab results indicating low iron levels and anemia. We also reviewed your diabetes management and the potential side effects of your current medication.  YOUR PLAN:  -IRON DEFICIENCY ANEMIA: Iron deficiency anemia is a condition where your body lacks enough iron to produce healthy red blood cells, leading to fatigue and low hemoglobin levels. We will address this with two doses of IV iron infusion at the infusion center, one per week, and start you on oral iron supplementation every other day to minimize constipation. Additionally, you will take over-the-counter vitamin B12 supplementation, 1000 micrograms daily. We will recheck your labs in six weeks to assess your response to the iron therapy. To investigate potential sources of gastrointestinal bleeding, you will need to schedule and complete a colonoscopy and endoscopy with GI specialists.   INSTRUCTIONS:  Please schedule and complete a colonoscopy and endoscopy with GI specialists to investigate potential sources of gastrointestinal bleeding. We will recheck your labs in six weeks to assess your response to the iron therapy.

## 2023-12-26 NOTE — Progress Notes (Signed)
 [facility]  HEMATOLOGY NEW VISIT  Adam Sams, DO  REASON FOR REFERRAL: Iron deficiency anemia  HISTORY OF PRESENT ILLNESS: Adam Long 48 y.o. male referred for iron deficiency anemia.  He has a past medical history of coronary disease, hypertension, GERD, hepatic steatosis, morbid obesity, uncontrolled type 2 diabetes with complications, hyperlipidemia, chronic anticoagulation secondary to aortic valve replacement.  He was accompanied by his cousin today.  Patient today reports that he feels fatigued all the time.He reports feeling "crappy all the time" and has a desire to sleep frequently.  He denies abdominal pain, nausea, vomiting, weight loss, bloody stools or melena.  He denies any recent weight loss.  He has no other complaints today.  He is a non-smoker, nonalcoholic.  Lives by himself in [place].  He has no family history of cancer.   I have reviewed the past medical history, past surgical history, social history and family history with the patient   ALLERGIES:  has no known allergies.  MEDICATIONS:  Current Outpatient Medications  Medication Sig Dispense Refill   ascorbic acid (VITAMIN C) 1000 MG tablet Take 1 tablet (1,000 mg total) by mouth daily. 30 tablet 0   busPIRone (BUSPAR) 7.5 MG tablet Take 1 tablet (7.5 mg total) by mouth 2 (two) times daily. 60 tablet 3   Continuous Glucose Sensor (FREESTYLE LIBRE 3 PLUS SENSOR) MISC as directed.     cyanocobalamin (VITAMIN B12) 1000 MCG tablet Take 1 tablet (1,000 mcg total) by mouth daily. 90 tablet 2   doxycycline (VIBRA-TABS) 100 MG tablet Take 1 tablet (100 mg total) by mouth 2 (two) times daily. 14 tablet 0   ezetimibe (ZETIA) 10 MG tablet Take 1 tablet (10 mg total) by mouth daily. 30 tablet 6   ferrous sulfate 325 (65 FE) MG EC tablet Take 1 tablet (325 mg total) by mouth every other day. 45 tablet 3   gabapentin (NEURONTIN) 400 MG capsule Take 2 capsules (800 mg total) by  mouth 3 (three) times daily. 540 capsule 1   insulin glargine (LANTUS SOLOSTAR) 100 UNIT/ML Solostar Pen Inject 38 Units into the skin daily. 15 mL 11   Insulin Pen Needle (BD PEN NEEDLE NANO U/F) 32G X 4 MM MISC Use as directed daily. 100 each 0   leptospermum manuka honey (MEDIHONEY) PSTE paste Apply thin layer to wound daily 44 mL 0   metFORMIN (GLUMETZA) 1000 MG (MOD) 24 hr tablet Take 1 tablet (1,000 mg total) by mouth 2 (two) times daily with a meal. 180 tablet 1   methocarbamol (ROBAXIN) 500 MG tablet Take 1 tablet (500 mg total) by mouth every 6 (six) hours as needed for muscle spasms. 60 tablet 0   metoprolol tartrate (LOPRESSOR) 25 MG tablet Take 1 tablet (25 mg total) by mouth 2 (two) times daily. 60 tablet 0   oxyCODONE (OXY IR/ROXICODONE) 5 MG immediate release tablet Take 1 tablet (5 mg total) by mouth every 6 (six) hours as needed for moderate pain (pain score 4-6) (pain score 4-6). 20 tablet 0   pantoprazole (PROTONIX) 40 MG tablet Take 1 tablet (40 mg total) by mouth daily. 30 tablet 0   rosuvastatin (CRESTOR) 10 MG tablet Take 1 tablet (10 mg total) by mouth daily. 90 tablet 3   sertraline (ZOLOFT) 50 MG tablet Take 1 tablet (50 mg total) by mouth daily. 30 tablet 0   warfarin (COUMADIN) 10 MG tablet Take 1 tablet (10 mg total) by mouth daily  at 4 PM. 30 tablet 0   Zinc Sulfate 220 (50 Zn) MG TABS Take 1 tablet (220 mg total) by mouth daily. 30 tablet 0   No current facility-administered medications for this visit.     REVIEW OF SYSTEMS:   Constitutional: Denies fevers, chills or night sweats Eyes: Denies blurriness of vision Ears, nose, mouth, throat, and face: Denies mucositis or sore throat Respiratory: Denies cough, dyspnea or wheezes Cardiovascular: Denies palpitation, chest discomfort or lower extremity swelling Gastrointestinal:  Denies nausea, heartburn or change in bowel habits Skin: Denies abnormal skin rashes Lymphatics: Denies new lymphadenopathy or easy  bruising Neurological:Denies numbness, tingling or new weaknesses Behavioral/Psych: Mood is stable, no new changes  All other systems were reviewed with the patient and are negative.  PHYSICAL EXAMINATION:   Vitals:   12/26/23 1013  BP: (!) 151/79  Pulse: 94  Resp: 20  Temp: 98.1 F (36.7 C)  SpO2: 98%    GENERAL:alert, no distress and comfortable SKIN: skin color, texture, turgor are normal, no rashes or significant lesions LUNGS: clear to auscultation and percussion with normal breathing effort HEART: regular rate & rhythm and no murmurs and no lower extremity edema ABDOMEN:abdomen soft, non-tender and normal bowel sounds Musculoskeletal: Right leg with a blister near the ankle.  Left leg above-knee amputation. NEURO: alert & oriented x 3 with fluent speech  LABORATORY DATA:  I have reviewed the data as listed  Lab Results  Component Value Date   WBC 9.8 12/13/2023   NEUTROABS 7,360 12/13/2023   HGB 8.9 (L) 12/13/2023   HCT 32.5 (L) 12/13/2023   MCV 67.0 (L) 12/13/2023   PLT 313 12/13/2023       Chemistry      Component Value Date/Time   NA 135 09/23/2023 1412   NA 136 06/04/2021 0909   K 4.6 09/23/2023 1412   CL 100 09/23/2023 1412   CO2 26 09/23/2023 1412   BUN 14 09/23/2023 1412   BUN 12 06/04/2021 0909   CREATININE 0.74 09/23/2023 1412      Component Value Date/Time   CALCIUM 9.4 09/23/2023 1412   ALKPHOS 57 08/01/2023 0538   AST 13 09/23/2023 1412   ALT 12 09/23/2023 1412   BILITOT 0.4 09/23/2023 1412   BILITOT 0.8 06/04/2021 0909      Latest Reference Range & Units 12/13/23 12:01  Iron 50 - 180 mcg/dL 19 (L)  TIBC 161 - 096 mcg/dL (calc) 045  %SAT 20 - 48 % (calc) 4 (L)  Ferritin 38 - 380 ng/mL 14 (L)  (L): Data is abnormally low   ASSESSMENT & PLAN:  Patient is a 48 year old male referred for iron deficiency anemia   Iron deficiency anemia Patient currently has iron deficiency anemia-unknown source.  Labs consistent with severe iron  deficiency.  Patient is on metformin and had previous borderline low vitamin B12 levels. -The most likely cause of his anemia is due to chronic blood loss or malabsorption syndrome. We discussed some of the risks, benefits, and alternatives of intravenous iron infusions. The patient is symptomatic from anemia and the iron level is critically low. He desires to achieve higher levels of iron faster for adequate hematopoesis. Some of the side-effects to be expected including risks of infusion reactions, phlebitis, headaches, nausea and fatigue.  The patient is willing to proceed. Patient education material was dispensed.  Goal is to keep ferritin level greater than 50 and resolution of anemia  - Start oral iron supplementation every other day to minimize constipation. -  Start over-the-counter vitamin B12 supplementation, 1000 micrograms daily. - Schedule and complete colonoscopy and endoscopy with GI specialists to investigate potential sources of gastrointestinal bleeding.  Appointment on 12/30/2023.  Return to clinic in 6 weeks with labs to assess the response to IV iron and discuss further management.   Orders Placed This Encounter  Procedures   Ferritin    Standing Status:   Future    Expected Date:   02/06/2024    Expiration Date:   12/25/2024   Folate    Standing Status:   Future    Expected Date:   02/06/2024    Expiration Date:   12/25/2024   Vitamin B12    Standing Status:   Future    Expected Date:   02/06/2024    Expiration Date:   12/25/2024   CBC with Differential/Platelet    Standing Status:   Future    Expected Date:   02/06/2024    Expiration Date:   12/25/2024   Comprehensive metabolic panel with GFR    Standing Status:   Future    Expected Date:   02/06/2024    Expiration Date:   12/25/2024   Iron and TIBC    Standing Status:   Future    Expected Date:   02/06/2024    Expiration Date:   12/25/2024    The total time spent in the appointment was 40 minutes encounter with  patients including review of chart and various tests results, discussions about plan of care and coordination of care plan   All questions were answered. The patient knows to call the clinic with any problems, questions or concerns. No barriers to learning was detected.   Cindie Crumbly, MD 3/31/20251:51 PM

## 2023-12-27 ENCOUNTER — Ambulatory Visit: Payer: Medicaid Other | Admitting: Family

## 2023-12-29 NOTE — Progress Notes (Unsigned)
 Referring Provider: Cindie Crumbly, MD  Primary Care Physician:  [name], DO Primary GI Physician: Dr. Marletta Lor  No chief complaint on file.   HPI:   Adam Long is a 48 y.o. male  with history of T2DM, left AKA 06/2023, AS s/p mechanical valve replacement chronically anticoagulated on warfarin, CAD, HTN, HLD, GERD, IDA,  presenting today at the request of Cindie Crumbly, MD  for IDA.   Per chnart review, he also has chronic hisory of anemia, iron deficiency noted at least since 2023.  FOBT positive in November 2024.  Most recent Hgb 8.9 on 12/13/23, iron 19, saturation 4%, ferritin 14.   Today:     No prior colonoscopy or EGD***  FHX: ***   Past Medical History:  Diagnosis Date   Anxiety    Aortic stenosis    Cellulitis and abscess of lower extremity 06/11/2019   Cellulitis of fourth toe of left foot    Cholelithiasis    Coronary artery disease    Nonobstructive CAD (40-50% LAD) 08/2019   Depression    Diabetes mellitus without complication (HCC)    Phreesia 09/27/2020   Elevated troponin level not due myocardial infarction 11/11/2019   Essential hypertension    Gangrene of toe of left foot (HCC) 07/06/2019   Heart murmur    Phreesia 09/27/2020   Hyperlipidemia    Phreesia 09/27/2020   Hypertension    Phreesia 09/27/2020   Mixed hyperlipidemia    Morbid obesity (HCC)    S/P aortic valve replacement with mechanical valve 12/05/2019   25 mm Carbomedics top hat bileaflet mechanical valve via partial upper hemi-sternotomy   Severe aortic stenosis 09/24/2019   Type 2 diabetes mellitus (HCC)     Past Surgical History:  Procedure Laterality Date   ABDOMINAL AORTOGRAM W/LOWER EXTREMITY N/A 07/06/2019   Procedure: ABDOMINAL AORTOGRAM W/LOWER EXTREMITY;  Surgeon: Sherren Kerns, MD;  [facility]: [facility];  Service: Cardiovascular;  Laterality: N/A;  Bilateral   AMPUTATION Left 07/09/2019   Procedure: LEFT FOURTH and Fifth TOE AMPUTATION.;   Surgeon: Larina Earthly, MD;  Location: [facility] OR;  Service: Vascular;  Laterality: Left;   AMPUTATION Left 03/11/2021   Procedure: LEFT FOOT 5TH  AND 4TH RAY AMPUTATION;  Surgeon: Nadara Mustard, MD;  Location: [facility] OR;  Service: Orthopedics;  Laterality: Left;   AMPUTATION Left 08/28/2021   Procedure: AMPUTATION BELOW KNEE;  Surgeon: Nadara Mustard, MD;  Location: [facility] OR;  Service: Orthopedics;  Laterality: Left;   AMPUTATION Left 01/29/2023   Procedure: LEFT AMPUTATION BELOW KNEE REVISION AND CLOSURE;  Surgeon: Nadara Mustard, MD;  Location: [facility] OR;  Service: Orthopedics;  Laterality: Left;   AMPUTATION Left 07/27/2023   Procedure: LEFT ABOVE KNEE AMPUTATION;  Surgeon: Nadara Mustard, MD;  Location: [facility] OR;  Service: Orthopedics;  Laterality: Left;   AORTIC VALVE REPLACEMENT N/A 12/05/2019   Procedure: PARTIAL STERNOTOMY FOR AORTIC VALVE REPLACEMENT (AVR), USING CARBOMEDICS SUPRA-ANNULAR TOP HAT ;  Surgeon: Purcell Nails, MD;  [facility];  Service: Open Heart Surgery;  Laterality: N/A;  No neck lines on left   APPLICATION OF WOUND VAC Left 10/30/2021   Procedure: APPLICATION OF WOUND VAC;  Surgeon: Nadara Mustard, MD;  Location: [facility] OR;  Service: Orthopedics;  Laterality: Left;   APPLICATION OF WOUND VAC Left 12/25/2021   Procedure: APPLICATION OF WOUND VAC;  Surgeon: Nadara Mustard, MD;  Location: [facility] OR;  Service: Orthopedics;  Laterality: Left;  APPLICATION OF WOUND VAC Left 01/26/2023   Procedure: APPLICATION OF WOUND VAC TO LEFT STUMP;  Surgeon: Tarry Kos, MD;  Location: [facility] OR;  Service: Orthopedics;  Laterality: Left;   CARDIAC VALVE REPLACEMENT N/A    Phreesia 09/27/2020   I & D EXTREMITY Left 01/26/2023   Procedure: IRRIGATION AND DEBRIDEMENT OF LEFT BKA STUMP;  Surgeon: Tarry Kos, MD;  Location: [facility] OR;  Service: Orthopedics;  Laterality: Left;   IR RADIOLOGY PERIPHERAL GUIDED IV START  10/05/2019   IR US GUIDE VASC ACCESS RIGHT  10/05/2019   MULTIPLE EXTRACTIONS WITH ALVEOLOPLASTY N/A  10/26/2019   Procedure: EXTRACTION OF TOOTH #'S 3, 5-11,19-28,  AND 32 WITH ALVEOLOPLASTY;  Surgeon: Charlynne Pander, DDS;  Location: [facility] OR;  Service: Oral Surgery;  Laterality: N/A;   RIGHT HEART CATH AND CORONARY ANGIOGRAPHY N/A 09/24/2019   Procedure: RIGHT HEART CATH AND CORONARY ANGIOGRAPHY;  Surgeon: Tonny Bollman, MD;  Location: [facility] INVASIVE CV LAB;  Service: Cardiovascular;  Laterality: N/A;   STUMP REVISION Left 10/30/2021   Procedure: REVISION LEFT BELOW KNEE AMPUTATION;  Surgeon: Nadara Mustard, MD;  Location: [facility] OR;  Service: Orthopedics;  Laterality: Left;   STUMP REVISION Left 12/25/2021   Procedure: REVISION LEFT BELOW KNEE AMPUTATION;  Surgeon: Nadara Mustard, MD;  Location: [facility] OR;  Service: Orthopedics;  Laterality: Left;   TEE WITHOUT CARDIOVERSION N/A 12/05/2019   Procedure: TRANSESOPHAGEAL ECHOCARDIOGRAM (TEE);  Surgeon: Purcell Nails, MD;  Location: [facility] OR;  Service: Open Heart Surgery;  Laterality: N/A;   TEE WITHOUT CARDIOVERSION N/A 09/01/2021   Procedure: TRANSESOPHAGEAL ECHOCARDIOGRAM (TEE);  Surgeon: Little Ishikawa, MD;  Location: [facility] ENDOSCOPY;  Service: Cardiovascular;  Laterality: N/A;    Current Outpatient Medications  Medication Sig Dispense Refill   ascorbic acid (VITAMIN C) 1000 MG tablet Take 1 tablet (1,000 mg total) by mouth daily. 30 tablet 0   busPIRone (BUSPAR) 7.5 MG tablet Take 1 tablet (7.5 mg total) by mouth 2 (two) times daily. 60 tablet 3   Continuous Glucose Sensor (FREESTYLE LIBRE 3 PLUS SENSOR) MISC as directed.     cyanocobalamin (VITAMIN B12) 1000 MCG tablet Take 1 tablet (1,000 mcg total) by mouth daily. 90 tablet 2   doxycycline (VIBRA-TABS) 100 MG tablet Take 1 tablet (100 mg total) by mouth 2 (two) times daily. 14 tablet 0   ezetimibe (ZETIA) 10 MG tablet Take 1 tablet (10 mg total) by mouth daily. 30 tablet 6   ferrous sulfate 325 (65 FE) MG EC tablet Take 1 tablet (325 mg total) by mouth every other day. 45 tablet 3   gabapentin  (NEURONTIN) 400 MG capsule Take 2 capsules (800 mg total) by mouth 3 (three) times daily. 540 capsule 1   insulin glargine (LANTUS SOLOSTAR) 100 UNIT/ML Solostar Pen Inject 38 Units into the skin daily. 15 mL 11   Insulin Pen Needle (BD PEN NEEDLE NANO U/F) 32G X 4 MM MISC Use as directed daily. 100 each 0   leptospermum manuka honey (MEDIHONEY) PSTE paste Apply thin layer to wound daily 44 mL 0   metFORMIN (GLUMETZA) 1000 MG (MOD) 24 hr tablet Take 1 tablet (1,000 mg total) by mouth 2 (two) times daily with a meal. 180 tablet 1   methocarbamol (ROBAXIN) 500 MG tablet Take 1 tablet (500 mg total) by mouth every 6 (six) hours as needed for muscle spasms. 60 tablet 0   metoprolol tartrate (LOPRESSOR) 25 MG tablet Take 1 tablet (25 mg total) by mouth  2 (two) times daily. 60 tablet 0   oxyCODONE (OXY IR/ROXICODONE) 5 MG immediate release tablet Take 1 tablet (5 mg total) by mouth every 6 (six) hours as needed for moderate pain (pain score 4-6) (pain score 4-6). 20 tablet 0   pantoprazole (PROTONIX) 40 MG tablet Take 1 tablet (40 mg total) by mouth daily. 30 tablet 0   rosuvastatin (CRESTOR) 10 MG tablet Take 1 tablet (10 mg total) by mouth daily. 90 tablet 3   sertraline (ZOLOFT) 50 MG tablet Take 1 tablet (50 mg total) by mouth daily. 30 tablet 0   warfarin (COUMADIN) 10 MG tablet Take 1 tablet (10 mg total) by mouth daily at 4 PM. 30 tablet 0   Zinc Sulfate 220 (50 Zn) MG TABS Take 1 tablet (220 mg total) by mouth daily. 30 tablet 0   No current facility-administered medications for this visit.    Allergies as of 12/30/2023   (No Known Allergies)    Family History  Problem Relation Age of Onset   Cancer Mother        Brain tumor   Heart disease Father    Hyperlipidemia Father    Hypertension Father    Stroke Father    Heart murmur Sister    Heart attack Maternal Grandmother    Liver disease Maternal Grandfather    Breast cancer Paternal Grandmother    COPD Paternal Grandfather     Heart attack Maternal Aunt    Colon cancer Maternal Uncle 67   Pancreatic cancer Paternal Aunt    Heart disease Paternal Uncle    Prostate cancer Neg Hx     Social History   Socioeconomic History   Marital status: Single    Spouse name: Not on file   Number of children: Not on file   Years of education: Not on file   Highest education level: Some college, no degree  Occupational History    Comment: Glass blower/designer- self-employed  Tobacco Use   Smoking status: Never   Smokeless tobacco: Former    Types: Chew    Quit date: 2021  Vaping Use   Vaping status: Never Used  Substance and Sexual Activity   Alcohol use: Yes    Alcohol/week: 4.0 standard drinks of alcohol    Types: 4 Cans of beer per week    Comment: occasionally   Drug use: No   Sexual activity: Yes    Birth control/protection: None  Other Topics Concern   Not on file  Social History Narrative   Lives with his friend.    Social Drivers of Corporate investment banker Strain: Low Risk  (09/22/2023)   Overall Financial Resource Strain (CARDIA)    Difficulty of Paying Living Expenses: Not hard at all  Food Insecurity: No Food Insecurity (09/22/2023)   Hunger Vital Sign    Worried About Running Out of Food in the Last Year: Never true    Ran Out of Food in the Last Year: Never true  Transportation Needs: No Transportation Needs (09/22/2023)   PRAPARE - Administrator, Civil Service (Medical): No    Lack of Transportation (Non-Medical): No  Physical Activity: Inactive (09/22/2023)   Exercise Vital Sign    Days of Exercise per Week: 0 days    Minutes of Exercise per Session: 10 min  Stress: Stress Concern Present (09/22/2023)   [employer] of Occupational Health - Occupational Stress Questionnaire    Feeling of Stress : Very much  Social Connections:  Socially Isolated (09/22/2023)   Social Connection and Isolation Panel [NHANES]    Frequency of Communication with Friends and Family: More  than three times a week    Frequency of Social Gatherings with Friends and Family: Twice a week    Attends Religious Services: Never    Database administrator or Organizations: No    Attends Engineer, structural: Not on file    Marital Status: Never married    Review of Systems: Gen: Denies fever, chills, anorexia. Denies fatigue, weakness, weight loss.  CV: Denies chest pain, palpitations, syncope, peripheral edema, and claudication. Resp: Denies dyspnea at rest, cough, wheezing, coughing up blood, and pleurisy. GI: Denies vomiting blood, jaundice, and fecal incontinence.   Denies dysphagia or odynophagia. Derm: Denies rash, itching, dry skin Psych: Denies depression, anxiety, memory loss, confusion. No homicidal or suicidal ideation.  Heme: Denies bruising, bleeding, and enlarged lymph nodes.  Physical Exam: There were no vitals taken for this visit. General:   Alert and oriented. No distress noted. Pleasant and cooperative.  Head:  Normocephalic and atraumatic. Eyes:  Conjuctiva clear without scleral icterus. Heart:  S1, S2 present without murmurs appreciated. Lungs:  Clear to auscultation bilaterally. No wheezes, rales, or rhonchi. No distress.  Abdomen:  +BS, soft, non-tender and non-distended. No rebound or guarding. No HSM or masses noted. Msk:  Symmetrical without gross deformities. Normal posture. Extremities:  Without edema. Neurologic:  Alert and  oriented x4 Psych:  Normal mood and affect.    Assessment:     Plan:  ***   Ermalinda Memos, PA-C [facility] 12/30/2023

## 2023-12-30 ENCOUNTER — Ambulatory Visit: Admitting: Gastroenterology

## 2023-12-30 ENCOUNTER — Encounter: Payer: Self-pay | Admitting: Gastroenterology

## 2023-12-30 ENCOUNTER — Inpatient Hospital Stay: Attending: Hematology

## 2023-12-30 ENCOUNTER — Telehealth: Payer: Self-pay | Admitting: *Deleted

## 2023-12-30 VITALS — BP 143/73 | HR 84 | Temp 98.6°F | Resp 18

## 2023-12-30 VITALS — BP 131/75 | HR 91 | Temp 97.8°F | Ht 70.0 in | Wt 330.0 lb

## 2023-12-30 DIAGNOSIS — D509 Iron deficiency anemia, unspecified: Secondary | ICD-10-CM | POA: Diagnosis present

## 2023-12-30 DIAGNOSIS — R197 Diarrhea, unspecified: Secondary | ICD-10-CM

## 2023-12-30 DIAGNOSIS — K219 Gastro-esophageal reflux disease without esophagitis: Secondary | ICD-10-CM

## 2023-12-30 DIAGNOSIS — R112 Nausea with vomiting, unspecified: Secondary | ICD-10-CM

## 2023-12-30 MED ORDER — SODIUM CHLORIDE 0.9 % IV SOLN: INTRAVENOUS | Status: DC

## 2023-12-30 MED ORDER — SODIUM CHLORIDE 0.9 % IV SOLN
510.0000 mg | Freq: Once | INTRAVENOUS | Status: AC
  Administered 2023-12-30: 510 mg via INTRAVENOUS
  Filled 2023-12-30: qty 510

## 2023-12-30 NOTE — Patient Instructions (Signed)
 [facility] [place] - A DEPT OF [facility]  Discharge Instructions: Thank you for choosing [facility] to provide your oncology and hematology care.  If you have a lab appointment with the Cancer Center - please note that after April 8th, 2024, all labs will be drawn in the cancer center.  You do not have to check in or register with the main entrance as you have in the past but will complete your check-in in the cancer center.  Wear comfortable clothing and clothing appropriate for easy access to any Portacath or PICC line.   We strive to give you quality time with your provider. You may need to reschedule your appointment if you arrive late (15 or more minutes).  Arriving late affects you and other patients whose appointments are after yours.  Also, if you miss three or more appointments without notifying the office, you may be dismissed from the clinic at the provider's discretion.      For prescription refill requests, have your pharmacy contact our office and allow 72 hours for refills to be completed.    Today you received the following chemotherapy and/or immunotherapy agents Feraheme. Ferumoxytol Injection What is this medication? FERUMOXYTOL (FER ue MOX i tol) treats low levels of iron in your body (iron deficiency anemia). Iron is a mineral that plays an important role in making red blood cells, which carry oxygen from your lungs to the rest of your body. This medicine may be used for other purposes; ask your health care provider or pharmacist if you have questions. COMMON BRAND NAME(S): Feraheme What should I tell my care team before I take this medication? They need to know if you have any of these conditions: Anemia not caused by low iron levels High levels of iron in the blood Magnetic resonance imaging (MRI) test scheduled An unusual or allergic reaction to iron, other medications, foods, dyes, or preservatives Pregnant or trying to get  pregnant Breastfeeding How should I use this medication? This medication is injected into a vein. It is given by your care team in a hospital or clinic setting. Talk to your care team the use of this medication in children. Special care may be needed. Overdosage: If you think you have taken too much of this medicine contact a poison control center or emergency room at once. NOTE: This medicine is only for you. Do not share this medicine with others. What if I miss a dose? It is important not to miss your dose. Call your care team if you are unable to keep an appointment. What may interact with this medication? Other iron products This list may not describe all possible interactions. Give your health care provider a list of all the medicines, herbs, non-prescription drugs, or dietary supplements you use. Also tell them if you smoke, drink alcohol, or use illegal drugs. Some items may interact with your medicine. What should I watch for while using this medication? Visit your care team for regular checks on your progress. Tell your care team if your symptoms do not start to get better or if they get worse. You may need blood work done while you are taking this medication. You may need to eat more foods that contain iron. Talk to your care team. Foods that contain iron include whole grains or cereals, dried fruits, beans, peas, leafy green vegetables, and organ meats (liver, kidney). What side effects may I notice from receiving this medication? Side effects that  you should report to your care team as soon as possible: Allergic reactions--skin rash, itching, hives, swelling of the face, lips, tongue, or throat Low blood pressure--dizziness, feeling faint or lightheaded, blurry vision Shortness of breath Side effects that usually do not require medical attention (report to your care team if they continue or are bothersome): Flushing Headache Joint pain Muscle pain Nausea Pain, redness, or  irritation at injection site This list may not describe all possible side effects. Call your doctor for medical advice about side effects. You may report side effects to FDA at 1-800-FDA-1088. Where should I keep my medication? This medication is given in a hospital or clinic. It will not be stored at home. NOTE: This sheet is a summary. It may not cover all possible information. If you have questions about this medicine, talk to your doctor, pharmacist, or health care provider.  2024 Elsevier/Gold Standard (2023-05-04 00:00:00)      To help prevent nausea and vomiting after your treatment, we encourage you to take your nausea medication as directed.  BELOW ARE SYMPTOMS THAT SHOULD BE REPORTED IMMEDIATELY: *FEVER GREATER THAN 100.4 F (38 C) OR HIGHER *CHILLS OR SWEATING *NAUSEA AND VOMITING THAT IS NOT CONTROLLED WITH YOUR NAUSEA MEDICATION *UNUSUAL SHORTNESS OF BREATH *UNUSUAL BRUISING OR BLEEDING *URINARY PROBLEMS (pain or burning when urinating, or frequent urination) *BOWEL PROBLEMS (unusual diarrhea, constipation, pain near the anus) TENDERNESS IN MOUTH AND THROAT WITH OR WITHOUT PRESENCE OF ULCERS (sore throat, sores in mouth, or a toothache) UNUSUAL RASH, SWELLING OR PAIN  UNUSUAL VAGINAL DISCHARGE OR ITCHING   Items with * indicate a potential emergency and should be followed up as soon as possible or go to the Emergency Department if any problems should occur.  Please show the CHEMOTHERAPY ALERT CARD or IMMUNOTHERAPY ALERT CARD at check-in to the Emergency Department and triage nurse.  Should you have questions after your visit or need to cancel or reschedule your appointment, please contact [facility] [place] - A DEPT OF Eligha Bridegroom [facility] [phone]  and follow the prompts.  Office hours are 8:00 a.m. to 4:30 p.m. Monday - Friday. Please note that voicemails left after 4:00 p.m. may not be returned until the following business day.  We are closed weekends  and major holidays. You have access to a nurse at all times for urgent questions. Please call the main number to the clinic [phone] and follow the prompts.  For any non-urgent questions, you may also contact your provider using MyChart. We now offer e-Visits for anyone 67 and older to request care online for non-urgent symptoms. For details visit [email or web address].   Also download the MyChart app! Go to the app store, search "MyChart", open the app, select [place], and log in with your MyChart username and password.

## 2023-12-30 NOTE — Telephone Encounter (Signed)
 Please advise holding Warfarin prior to EGD.  Thank you!  DW

## 2023-12-30 NOTE — Telephone Encounter (Signed)
  Request for patient to stop medication prior to procedure or is needing cleareance  12/30/23  Adam Long 1976/07/22  What type of surgery is being performed? Colonoscopy/Esophagogastroduodenoscopy (EGD)  When is surgery scheduled? TBD  What type of clearance is required (medical or pharmacy to hold medication or both? medication  Are there any medications that need to be held prior to surgery and how long? Warfarin x 5 days prior to procedure. Does pt need to have Lovenox bridge?  Name of physician performing surgery?  Polly[facility] Phone: [phone] Fax: [phone]  Anethesia type (none, local, MAC, general)? MAC

## 2023-12-30 NOTE — Telephone Encounter (Signed)
   Name: TANIELA FELTUS  DOB: [birth year]  MRN: [id number]  Primary Cardiologist: Nona Dell, MD  Chart reviewed as part of pre-operative protocol coverage. Because of TAYSEN BUSHART past medical history and time since last visit, he will require a follow-up in-office visit in order to better assess preoperative cardiovascular risk.  Pre-op covering staff: - Please schedule appointment and call patient to inform them. If patient already had an upcoming appointment within acceptable timeframe, please add "pre-op clearance" to the appointment notes so provider is aware. - Please contact requesting surgeon's office via preferred method (i.e, phone, fax) to inform them of need for appointment prior to surgery.  This message will also be routed to pharmacy pool  for input on holding coumadin as requested below so that this information is available to the clearing provider at time of patient's appointment.   Carlos Levering, NP  12/30/2023, 10:50 AM

## 2023-12-30 NOTE — Telephone Encounter (Signed)
 Patient with diagnosis of mechanical aortic valve on warfarin for anticoagulation.    What type of surgery is being performed? Colonoscopy/Esophagogastroduodenoscopy (EGD)   When is surgery scheduled? TBD   CrCl > 100 (with adjusted body weight) Platelet count 313   Per office protocol, patient can hold warfarin for 5 days prior to procedure.   Patient will not need bridging with Lovenox (enoxaparin) around procedure.  **This guidance is not considered finalized until pre-operative APP has relayed final recommendations.**

## 2023-12-30 NOTE — Telephone Encounter (Signed)
 I s/w the pt and who agrees to the in office appt for preop. I reviewed both [place] and [place] schedules and did not see anything. I stated to the pt that I can either send a message to scheduling team to see if they can get an appt for him or we could schedule in [place]. Pt opts to send a message to the scheduling team. Pt is aware someone from the scheduling team will call him with an appt. Pt thanked me for our help.

## 2023-12-30 NOTE — Patient Instructions (Signed)
 Please have blood work and stool testing completed at [facility].  We will get you scheduled for an upper endoscopy and colonoscopy in the near future with Dr. Marletta Lor. Reach out to cardiology about holding warfarin for 5 days prior with possible Lovenox bridging. He will also need to hold iron for 7 days prior to procedures. 1 day prior to procedure: Take one half dose of Lantus at bedtime.  You may take your normal morning dose of metformin, but take one half dose in the evening. Day of procedure: No morning diabetes medications.  I will plan to see back in the office after your procedures.  It was good to meet you today!  Ermalinda Memos, PA-C [facility]

## 2023-12-30 NOTE — Progress Notes (Signed)
 Patient presents today for 1st Feraheme infusion. Vital signs stable. Patient has no complaints today or concerns.   Feraheme given today per MD orders. Tolerated infusion without adverse affects. Vital signs stable. No complaints at this time. Discharged from clinic by wheel chair in stable condition. Alert and oriented x 3. F/U with [facility] as scheduled.

## 2023-12-30 NOTE — Telephone Encounter (Signed)
 Pt has appt with Dr. Diona Browner 01/25/24 for preop clearance.

## 2023-12-31 ENCOUNTER — Other Ambulatory Visit (HOSPITAL_COMMUNITY): Payer: Self-pay

## 2024-01-03 ENCOUNTER — Encounter: Payer: Self-pay | Admitting: Gastroenterology

## 2024-01-03 ENCOUNTER — Ambulatory Visit: Admitting: Family

## 2024-01-03 DIAGNOSIS — T8781 Dehiscence of amputation stump: Secondary | ICD-10-CM

## 2024-01-05 ENCOUNTER — Other Ambulatory Visit: Payer: Self-pay | Admitting: Physical Medicine & Rehabilitation

## 2024-01-05 DIAGNOSIS — F419 Anxiety disorder, unspecified: Secondary | ICD-10-CM

## 2024-01-05 MED FILL — Ferumoxytol Inj 510 MG/17ML (30 MG/ML) (Elemental Fe): INTRAVENOUS | Qty: 17 | Status: AC

## 2024-01-06 ENCOUNTER — Other Ambulatory Visit: Payer: Self-pay | Admitting: Physical Medicine & Rehabilitation

## 2024-01-06 ENCOUNTER — Inpatient Hospital Stay

## 2024-01-06 ENCOUNTER — Other Ambulatory Visit (HOSPITAL_COMMUNITY): Payer: Self-pay

## 2024-01-06 ENCOUNTER — Encounter: Payer: Self-pay | Admitting: Family

## 2024-01-06 ENCOUNTER — Other Ambulatory Visit (HOSPITAL_BASED_OUTPATIENT_CLINIC_OR_DEPARTMENT_OTHER): Payer: Self-pay

## 2024-01-06 NOTE — Progress Notes (Signed)
 Office Visit Note   Patient: Adam Long           Date of Birth: [birth year]           MRN: [id number] Visit Date: 01/03/2024              Requested by: [name], DO [address] [place],  [place] [address] PCP: [name], DO  Chief Complaint  Patient presents with   Left Leg - Follow-up    07/27/2023 left AKA      HPI: The patient is a 48 year old gentleman who is seen status post left above-knee amputation October 30 he has had an area of wound dehiscence which has been slow to heal they have been cleansing with Vashe and packing open with gauze  Assessment & Plan: Visit Diagnoses: No diagnosis found.  Plan: Encouraged him to proceed with prosthesis set up.  They will continue with current wound care and follow-up in the office if the wound fails to improve as expected  Follow-Up Instructions: Return in about 6 weeks (around 02/14/2024), or if symptoms worsen or fail to improve.   Ortho Exam  Patient is alert, oriented, no adenopathy, well-dressed, normal affect, normal respiratory effort. On examination left above-knee amputation there is 1 remaining area just lateral to his femur this is about 5 mm in diameter 5 mm deep there is no exposed bone or drainage no surrounding erythema  Imaging: No results found. No images are attached to the encounter.  Labs: Lab Results  Component Value Date   HGBA1C 10.7 (H) 12/13/2023   HGBA1C 10.2 (H) 07/26/2023   HGBA1C 10.2 (H) 07/25/2023   ESRSEDRATE 60 (H) 09/14/2021   ESRSEDRATE 65 (H) 09/07/2021   ESRSEDRATE 104 (H) 08/29/2021   CRP 1.5 (H) 09/14/2021   CRP 3.4 (H) 09/07/2021   CRP 12.8 (H) 08/29/2021   LABURIC 7.9 02/07/2023   REPTSTATUS 07/30/2023 FINAL 07/25/2023   GRAMSTAIN  01/26/2023    RARE WBC PRESENT, PREDOMINANTLY PMN FEW GRAM POSITIVE COCCI IN PAIRS FEW GRAM POSITIVE COCCI IN CLUSTERS    CULT  07/25/2023    NO GROWTH 5 DAYS Performed at [facility], [address],  [place], [place] [address]    LABORGA KLEBSIELLA PNEUMONIAE (A) 07/25/2023     Lab Results  Component Value Date   ALBUMIN 2.5 (L) 08/01/2023   ALBUMIN 2.4 (L) 07/26/2023   ALBUMIN 2.6 (L) 07/25/2023   PREALBUMIN 17 (L) 07/26/2023   PREALBUMIN 21.6 10/29/2021    Lab Results  Component Value Date   MG 1.7 07/31/2023   MG 1.8 07/30/2023   MG 1.8 07/29/2023   Lab Results  Component Value Date   VD25OH 15.4 (L) 04/27/2022   VD25OH 38 06/13/2020    Lab Results  Component Value Date   PREALBUMIN 17 (L) 07/26/2023   PREALBUMIN 21.6 10/29/2021      Latest Ref Rng & Units 12/13/2023   12:01 PM 09/23/2023    2:12 PM 08/15/2023    6:05 AM  CBC EXTENDED  WBC 3.8 - 10.8 Thousand/uL 9.8  12.2  7.9   RBC 4.20 - 5.80 Million/uL 4.85  4.28  3.62   Hemoglobin 13.2 - 17.1 g/dL 8.9  8.3  7.9   HCT 82.9 - 50.0 % 32.5  29.0  26.7   Platelets 140 - 400 Thousand/uL 313  415  330   NEUT# 1,500 - 7,800 cells/uL 7,360        There  is no height or weight on file to calculate BMI.  Orders:  No orders of the defined types were placed in this encounter.  No orders of the defined types were placed in this encounter.    Procedures: No procedures performed  Clinical Data: No additional findings.  ROS:  All other systems negative, except as noted in the HPI. Review of Systems  Objective: Vital Signs: There were no vitals taken for this visit.  Specialty Comments:  No specialty comments available.  PMFS History: Patient Active Problem List   Diagnosis Date Noted   Iron deficiency anemia 12/22/2023   S/P AKA (above knee amputation), left (HCC) 07/31/2023   Hepatic steatosis 03/25/2023   GERD (gastroesophageal reflux disease) 01/24/2023   History of osteomyelitis 10/12/2022   Uncontrolled type 2 diabetes mellitus with hyperglycemia (HCC) 10/12/2022   CAD (coronary artery disease) 10/12/2022   Encounter for therapeutic drug monitoring 10/12/2022   Anxiety 09/30/2020   S/P aortic  valve replacement with mechanical valve 12/05/2019   Hyperlipidemia associated with type 2 diabetes mellitus (HCC) 10/12/2015   Class 3 obesity 10/12/2015   HTN (hypertension) 07/02/2013   Past Medical History:  Diagnosis Date   Anxiety    Aortic stenosis    Cellulitis and abscess of lower extremity 06/11/2019   Cellulitis of fourth toe of left foot    Cholelithiasis    Coronary artery disease    Nonobstructive CAD (40-50% LAD) 08/2019   Depression    Diabetes mellitus without complication (HCC)    Phreesia 09/27/2020   Elevated troponin level not due myocardial infarction 11/11/2019   Essential hypertension    Gangrene of toe of left foot (HCC) 07/06/2019   Heart murmur    Phreesia 09/27/2020   Hyperlipidemia    Phreesia 09/27/2020   Hypertension    Phreesia 09/27/2020   Mixed hyperlipidemia    Morbid obesity (HCC)    S/P aortic valve replacement with mechanical valve 12/05/2019   25 mm Carbomedics top hat bileaflet mechanical valve via partial upper hemi-sternotomy   Severe aortic stenosis 09/24/2019   Type 2 diabetes mellitus (HCC)     Family History  Problem Relation Age of Onset   Cancer Mother        Brain tumor   Heart disease Father    Hyperlipidemia Father    Hypertension Father    Stroke Father    Heart murmur Sister    Heart attack Maternal Grandmother    Liver disease Maternal Grandfather    Breast cancer Paternal Grandmother    COPD Paternal Grandfather    Heart attack Maternal Aunt    Colon cancer Maternal Uncle 67   Pancreatic cancer Paternal Aunt    Heart disease Paternal Uncle    Prostate cancer Neg Hx     Past Surgical History:  Procedure Laterality Date   ABDOMINAL AORTOGRAM W/LOWER EXTREMITY N/A 07/06/2019   Procedure: ABDOMINAL AORTOGRAM W/LOWER EXTREMITY;  Surgeon: Sherren Kerns, MD;  Location: [facility];  Service: Cardiovascular;  Laterality: N/A;  Bilateral   AMPUTATION Left 07/09/2019   Procedure: LEFT FOURTH and Fifth TOE  AMPUTATION.;  Surgeon: Larina Earthly, MD;  Location[facility] OR;  Service: Vascular;  Laterality: Left;   AMPUTATION Left 03/11/2021   Procedure: LEFT FOOT 5TH  AND 4TH RAY AMPUTATION;  Surgeon: Nadara Mustard, MD;  Location: [facility] OR;  Service: Orthopedics;  Laterality: Left;   AMPUTATION Left 08/28/2021   Procedure: AMPUTATION BELOW KNEE;  Surgeon: Nadara Mustard, MD;  Location: [facility] OR;  Service: Orthopedics;  Laterality: Left;   AMPUTATION Left 01/29/2023   Procedure: LEFT AMPUTATION BELOW KNEE REVISION AND CLOSURE;  Surgeon: Nadara Mustard, MD;  Location: [facility] OR;  Service: Orthopedics;  Laterality: Left;   AMPUTATION Left 07/27/2023   Procedure: LEFT ABOVE KNEE AMPUTATION;  Surgeon: Nadara Mustard, MD;  Location: [facility] OR;  Service: Orthopedics;  Laterality: Left;   AORTIC VALVE REPLACEMENT N/A 12/05/2019   Procedure: PARTIAL STERNOTOMY FOR AORTIC VALVE REPLACEMENT (AVR), USING CARBOMEDICS SUPRA-ANNULAR TOP HAT ;  Surgeon: Purcell Nails, MD;  Location: [facility] OR;  Service: Open Heart Surgery;  Laterality: N/A;  No neck lines on left   APPLICATION OF WOUND VAC Left 10/30/2021   Procedure: APPLICATION OF WOUND VAC;  Surgeon: Nadara Mustard, MD;  Location: [facility] OR;  Service: Orthopedics;  Laterality: Left;   APPLICATION OF WOUND VAC Left 12/25/2021   Procedure: APPLICATION OF WOUND VAC;  Surgeon: Nadara Mustard, MD;  Location: [facility] OR;  Service: Orthopedics;  Laterality: Left;   APPLICATION OF WOUND VAC Left 01/26/2023   Procedure: APPLICATION OF WOUND VAC TO LEFT STUMP;  Surgeon: Tarry Kos, MD;  Location: [facility] OR;  Service: Orthopedics;  Laterality: Left;   CARDIAC VALVE REPLACEMENT N/A    Phreesia 09/27/2020   I & D EXTREMITY Left 01/26/2023   Procedure: IRRIGATION AND DEBRIDEMENT OF LEFT BKA STUMP;  Surgeon: Tarry Kos, MD;  Location: [facility] OR;  Service: Orthopedics;  Laterality: Left;   IR RADIOLOGY PERIPHERAL GUIDED IV START  10/05/2019   IR US GUIDE VASC ACCESS RIGHT  10/05/2019   MULTIPLE EXTRACTIONS WITH  ALVEOLOPLASTY N/A 10/26/2019   Procedure: EXTRACTION OF TOOTH #'S 3, 5-11,19-28,  AND 32 WITH ALVEOLOPLASTY;  Surgeon: Charlynne Pander, DDS;  Location: [facility] OR;  Service: Oral Surgery;  Laterality: N/A;   RIGHT HEART CATH AND CORONARY ANGIOGRAPHY N/A 09/24/2019   Procedure: RIGHT HEART CATH AND CORONARY ANGIOGRAPHY;  Surgeon: Tonny Bollman, MD;  Location: [facility] INVASIVE CV LAB;  Service: Cardiovascular;  Laterality: N/A;   STUMP REVISION Left 10/30/2021   Procedure: REVISION LEFT BELOW KNEE AMPUTATION;  Surgeon: Nadara Mustard, MD;  Location: [facility] OR;  Service: Orthopedics;  Laterality: Left;   STUMP REVISION Left 12/25/2021   Procedure: REVISION LEFT BELOW KNEE AMPUTATION;  Surgeon: Nadara Mustard, MD;  Location: [facility] OR;  Service: Orthopedics;  Laterality: Left;   TEE WITHOUT CARDIOVERSION N/A 12/05/2019   Procedure: TRANSESOPHAGEAL ECHOCARDIOGRAM (TEE);  Surgeon: Purcell Nails, MD;  Location: [facility] OR;  Service: Open Heart Surgery;  Laterality: N/A;   TEE WITHOUT CARDIOVERSION N/A 09/01/2021   Procedure: TRANSESOPHAGEAL ECHOCARDIOGRAM (TEE);  Surgeon: Little Ishikawa, MD;  Location: [facility] ENDOSCOPY;  Service: Cardiovascular;  Laterality: N/A;   Social History   Occupational History    Comment: Glass blower/designer- self-employed  Tobacco Use   Smoking status: Never   Smokeless tobacco: Former    Types: Chew    Quit date: 2021  Vaping Use   Vaping status: Never Used  Substance and Sexual Activity   Alcohol use: Yes    Alcohol/week: 4.0 standard drinks of alcohol    Types: 4 Cans of beer per week    Comment: 3 days a week.   Drug use: No   Sexual activity: Yes    Birth control/protection: None

## 2024-01-07 ENCOUNTER — Other Ambulatory Visit (HOSPITAL_COMMUNITY): Payer: Self-pay

## 2024-01-08 ENCOUNTER — Other Ambulatory Visit: Payer: Self-pay | Admitting: Physical Medicine & Rehabilitation

## 2024-01-08 DIAGNOSIS — F419 Anxiety disorder, unspecified: Secondary | ICD-10-CM

## 2024-01-09 ENCOUNTER — Inpatient Hospital Stay

## 2024-01-09 ENCOUNTER — Other Ambulatory Visit (HOSPITAL_COMMUNITY): Payer: Self-pay

## 2024-01-09 VITALS — BP 164/79 | HR 78 | Temp 97.4°F | Resp 18

## 2024-01-09 DIAGNOSIS — D509 Iron deficiency anemia, unspecified: Secondary | ICD-10-CM | POA: Diagnosis not present

## 2024-01-09 MED ORDER — SODIUM CHLORIDE 0.9 % IV SOLN: INTRAVENOUS | Status: DC

## 2024-01-09 MED ORDER — SODIUM CHLORIDE 0.9 % IV SOLN
510.0000 mg | Freq: Once | INTRAVENOUS | Status: AC
  Administered 2024-01-09: 510 mg via INTRAVENOUS
  Filled 2024-01-09: qty 17
  Filled 2024-01-09: qty 510

## 2024-01-09 NOTE — Patient Instructions (Signed)
 [facility] [place] - A DEPT OF [facility]  Discharge Instructions: Thank you for choosing [facility] to provide your oncology and hematology care.  If you have a lab appointment with the Cancer Center - please note that after April 8th, 2024, all labs will be drawn in the cancer center.  You do not have to check in or register with the main entrance as you have in the past but will complete your check-in in the cancer center.  Wear comfortable clothing and clothing appropriate for easy access to any Portacath or PICC line.   We strive to give you quality time with your provider. You may need to reschedule your appointment if you arrive late (15 or more minutes).  Arriving late affects you and other patients whose appointments are after yours.  Also, if you miss three or more appointments without notifying the office, you may be dismissed from the clinic at the provider's discretion.      For prescription refill requests, have your pharmacy contact our office and allow 72 hours for refills to be completed.    Today you received the following chemotherapy and/or immunotherapy agents Feraheme      To help prevent nausea and vomiting after your treatment, we encourage you to take your nausea medication as directed.  BELOW ARE SYMPTOMS THAT SHOULD BE REPORTED IMMEDIATELY: *FEVER GREATER THAN 100.4 F (38 C) OR HIGHER *CHILLS OR SWEATING *NAUSEA AND VOMITING THAT IS NOT CONTROLLED WITH YOUR NAUSEA MEDICATION *UNUSUAL SHORTNESS OF BREATH *UNUSUAL BRUISING OR BLEEDING *URINARY PROBLEMS (pain or burning when urinating, or frequent urination) *BOWEL PROBLEMS (unusual diarrhea, constipation, pain near the anus) TENDERNESS IN MOUTH AND THROAT WITH OR WITHOUT PRESENCE OF ULCERS (sore throat, sores in mouth, or a toothache) UNUSUAL RASH, SWELLING OR PAIN  UNUSUAL VAGINAL DISCHARGE OR ITCHING   Items with * indicate a potential emergency and should be followed up  as soon as possible or go to the Emergency Department if any problems should occur.  Please show the CHEMOTHERAPY ALERT CARD or IMMUNOTHERAPY ALERT CARD at check-in to the Emergency Department and triage nurse.  Should you have questions after your visit or need to cancel or reschedule your appointment, please contact [facility] [place] - A DEPT OF [facility] [phone]  and follow the prompts.  Office hours are 8:00 a.m. to 4:30 p.m. Monday - Friday. Please note that voicemails left after 4:00 p.m. may not be returned until the following business day.  We are closed weekends and major holidays. You have access to a nurse at all times for urgent questions. Please call the main number to the clinic [phone] and follow the prompts.  For any non-urgent questions, you may also contact your provider using MyChart. We now offer e-Visits for anyone 80 and older to request care online for non-urgent symptoms. For details visit [email or web address].   Also download the MyChart app! Go to the app store, search "MyChart", open the app, select [place], and log in with your MyChart username and password.

## 2024-01-09 NOTE — Progress Notes (Signed)
 Patient presents today for Feraheme infusion per providers order.  Vital signs WNL.  Patient has no new complaints at this time.  Peripheral IV started and blood return noted pre and post infusion.  Stable during infusion without adverse affects.  Vital signs stable.  No complaints at this time.  Discharge from clinic ambulatory in stable condition.  Alert and oriented X 3.  Follow up with [facility] as scheduled.

## 2024-01-10 MED ORDER — PANTOPRAZOLE SODIUM 40 MG PO TBEC
40.0000 mg | DELAYED_RELEASE_TABLET | Freq: Every day | ORAL | 3 refills | Status: AC
  Filled 2024-01-10: qty 90, 90d supply, fill #0
  Filled 2024-04-13 – 2024-04-16 (×2): qty 90, 90d supply, fill #1
  Filled 2024-05-04 – 2024-07-16 (×2): qty 90, 90d supply, fill #2
  Filled 2024-09-20: qty 90, 90d supply, fill #3

## 2024-01-10 MED ORDER — METOPROLOL TARTRATE 25 MG PO TABS
25.0000 mg | ORAL_TABLET | Freq: Two times a day (BID) | ORAL | 3 refills | Status: AC
  Filled 2024-01-10: qty 180, 90d supply, fill #0
  Filled 2024-04-13 – 2024-04-16 (×2): qty 180, 90d supply, fill #1
  Filled 2024-05-04 – 2024-07-16 (×2): qty 180, 90d supply, fill #2
  Filled 2024-09-20: qty 180, 90d supply, fill #3

## 2024-01-10 MED ORDER — SERTRALINE HCL 50 MG PO TABS
50.0000 mg | ORAL_TABLET | Freq: Every day | ORAL | 3 refills | Status: AC
  Filled 2024-01-10: qty 90, 90d supply, fill #0
  Filled 2024-04-13 – 2024-04-16 (×2): qty 90, 90d supply, fill #1
  Filled 2024-05-04 – 2024-07-16 (×4): qty 90, 90d supply, fill #2
  Filled 2024-09-20: qty 90, 90d supply, fill #3

## 2024-01-11 ENCOUNTER — Ambulatory Visit: Admitting: Cardiology

## 2024-01-11 ENCOUNTER — Other Ambulatory Visit: Payer: Self-pay

## 2024-01-11 ENCOUNTER — Other Ambulatory Visit (HOSPITAL_COMMUNITY): Payer: Self-pay

## 2024-01-16 ENCOUNTER — Other Ambulatory Visit: Payer: Self-pay | Admitting: Family Medicine

## 2024-01-16 ENCOUNTER — Other Ambulatory Visit: Payer: Self-pay

## 2024-01-16 ENCOUNTER — Other Ambulatory Visit: Payer: Self-pay | Admitting: Physical Medicine & Rehabilitation

## 2024-01-16 MED ORDER — WARFARIN SODIUM 10 MG PO TABS
10.0000 mg | ORAL_TABLET | Freq: Every day | ORAL | 0 refills | Status: DC
  Filled 2024-01-16 – 2024-02-13 (×2): qty 90, 90d supply, fill #0

## 2024-01-17 ENCOUNTER — Other Ambulatory Visit (HOSPITAL_COMMUNITY): Payer: Self-pay

## 2024-01-17 ENCOUNTER — Other Ambulatory Visit: Payer: Self-pay

## 2024-01-25 ENCOUNTER — Ambulatory Visit: Admitting: Cardiology

## 2024-01-31 ENCOUNTER — Inpatient Hospital Stay: Attending: Oncology

## 2024-01-31 DIAGNOSIS — E538 Deficiency of other specified B group vitamins: Secondary | ICD-10-CM | POA: Insufficient documentation

## 2024-01-31 DIAGNOSIS — D509 Iron deficiency anemia, unspecified: Secondary | ICD-10-CM | POA: Diagnosis present

## 2024-01-31 LAB — COMPREHENSIVE METABOLIC PANEL WITH GFR
ALT: 47 U/L — ABNORMAL HIGH (ref 0–44)
AST: 48 U/L — ABNORMAL HIGH (ref 15–41)
Albumin: 3.7 g/dL (ref 3.5–5.0)
Alkaline Phosphatase: 38 U/L (ref 38–126)
Anion gap: 14 (ref 5–15)
BUN: 10 mg/dL (ref 6–20)
CO2: 24 mmol/L (ref 22–32)
Calcium: 9.3 mg/dL (ref 8.9–10.3)
Chloride: 94 mmol/L — ABNORMAL LOW (ref 98–111)
Creatinine, Ser: 0.82 mg/dL (ref 0.61–1.24)
GFR, Estimated: >60 mL/min (ref >60)
Glucose, Bld: 273 mg/dL — ABNORMAL HIGH (ref 70–99)
Potassium: 4.5 mmol/L (ref 3.5–5.1)
Sodium: 132 mmol/L — ABNORMAL LOW (ref 135–145)
Total Bilirubin: 0.7 mg/dL (ref 0.0–1.2)
Total Protein: 7.3 g/dL (ref 6.5–8.1)

## 2024-01-31 LAB — CBC WITH DIFFERENTIAL/PLATELET
Abs Immature Granulocytes: 0.03 10*3/uL (ref 0.00–0.07)
Basophils Absolute: 0.1 10*3/uL (ref 0.0–0.1)
Basophils Relative: 1 %
Eosinophils Absolute: 0.1 10*3/uL (ref 0.0–0.5)
Eosinophils Relative: 2 %
HCT: 40.6 % (ref 39.0–52.0)
Hemoglobin: 12.6 g/dL — ABNORMAL LOW (ref 13.0–17.0)
Immature Granulocytes: 0 %
Lymphocytes Relative: 20 %
Lymphs Abs: 1.5 10*3/uL (ref 0.7–4.0)
MCH: 23.7 pg — ABNORMAL LOW (ref 26.0–34.0)
MCHC: 31 g/dL (ref 30.0–36.0)
MCV: 76.3 fL — ABNORMAL LOW (ref 80.0–100.0)
Monocytes Absolute: 0.5 10*3/uL (ref 0.1–1.0)
Monocytes Relative: 6 %
Neutro Abs: 5.5 10*3/uL (ref 1.7–7.7)
Neutrophils Relative %: 71 %
Platelets: 266 10*3/uL (ref 150–400)
RBC: 5.32 MIL/uL (ref 4.22–5.81)
WBC: 7.7 10*3/uL (ref 4.0–10.5)
nRBC: 0 % (ref 0.0–0.2)

## 2024-01-31 LAB — IRON AND TIBC
Iron: 35 ug/dL — ABNORMAL LOW (ref 45–182)
Saturation Ratios: 9 % — ABNORMAL LOW (ref 17.9–39.5)
TIBC: 409 ug/dL (ref 250–450)
UIBC: 374 ug/dL

## 2024-01-31 LAB — FERRITIN: Ferritin: 65 ng/mL (ref 24–336)

## 2024-01-31 LAB — VITAMIN B12: Vitamin B-12: 321 pg/mL (ref 180–914)

## 2024-01-31 LAB — FOLATE: Folate: 14.3 ng/mL (ref >5.9)

## 2024-02-03 ENCOUNTER — Ambulatory Visit: Attending: Cardiology | Admitting: Cardiology

## 2024-02-07 ENCOUNTER — Inpatient Hospital Stay: Admitting: Oncology

## 2024-02-08 ENCOUNTER — Encounter: Payer: Self-pay | Admitting: *Deleted

## 2024-02-08 NOTE — Telephone Encounter (Signed)
 Sent mychart message to pt  Adam Long Cpt was supposed to be seen in office with Dr. Londa Rival, he cancelled one appt and then no showed for the appt 5/9  CF he needs to call and tell the operator he needs to reschedule his appt with Dr. Londa Rival or PA/NP for preop clearance  good afternoon [name]. [name] was scheduled for a preop clearance appointment for 01/25/24. Looks like it was cancelled. Has he been rescheduled?  I'm just asking because needed to get him scheduled for his procedure.

## 2024-02-13 ENCOUNTER — Other Ambulatory Visit (HOSPITAL_COMMUNITY): Payer: Self-pay

## 2024-02-15 ENCOUNTER — Inpatient Hospital Stay (HOSPITAL_BASED_OUTPATIENT_CLINIC_OR_DEPARTMENT_OTHER): Admitting: Oncology

## 2024-02-15 VITALS — BP 166/99 | HR 95 | Temp 97.5°F | Resp 18

## 2024-02-15 DIAGNOSIS — E538 Deficiency of other specified B group vitamins: Secondary | ICD-10-CM | POA: Diagnosis not present

## 2024-02-15 DIAGNOSIS — D500 Iron deficiency anemia secondary to blood loss (chronic): Secondary | ICD-10-CM | POA: Diagnosis not present

## 2024-02-15 DIAGNOSIS — D509 Iron deficiency anemia, unspecified: Secondary | ICD-10-CM | POA: Diagnosis not present

## 2024-02-15 NOTE — Assessment & Plan Note (Signed)
 Patient currently has iron deficiency anemia-unknown source.  Labs consistent with severe iron deficiency.   Patient is on metformin  and had previous borderline low vitamin B12 levels. Patient received 2 doses of IV Feraheme  510 mg and has some slight improvement in symptoms and iron panel.  He also has improvement in his hemoglobin. Patient does not report any melena or hematochezia Patient is scheduled to get colonoscopy and endoscopy done after clearance from his cardiologist  - Will give 2 more doses of IV iron - Continue oral iron every other day - Continue to follow with GI for colonoscopy and endoscopy Return to clinic in 2 months with labs to assess for response

## 2024-02-15 NOTE — Assessment & Plan Note (Signed)
 Patient has mild vitamin B12 deficiency with levels less than 400  - Continue oral vitamin B12 1000 mcg daily

## 2024-02-15 NOTE — Progress Notes (Signed)
 [facility]  HEMATOLOGY FOLLOW-UP VISIT  Adam Ast, DO  REASON FOR FOLLOW-UP: Iron deficiency anemia  ASSESSMENT & PLAN:  Patient is a 48 y.o. male following for iron deficiency anemia  Iron deficiency anemia Patient currently has iron deficiency anemia-unknown source.  Labs consistent with severe iron deficiency.   Patient is on metformin  and had previous borderline low vitamin B12 levels. Patient received 2 doses of IV Feraheme  510 mg and has some slight improvement in symptoms and iron panel.  He also has improvement in his hemoglobin. Patient does not report any melena or hematochezia Patient is scheduled to get colonoscopy and endoscopy done after clearance from his cardiologist  - Will give 2 more doses of IV iron - Continue oral iron every other day - Continue to follow with GI for colonoscopy and endoscopy Return to clinic in 2 months with labs to assess for response  Vitamin B12 deficiency Patient has mild vitamin B12 deficiency with levels less than 400  - Continue oral vitamin B12 1000 mcg daily    Orders Placed This Encounter  Procedures   Ferritin    Standing Status:   Future    Expected Date:   03/19/2024    Expiration Date:   02/14/2025   Folate    Standing Status:   Future    Expected Date:   03/19/2024    Expiration Date:   02/14/2025   Vitamin B12    Standing Status:   Future    Expected Date:   03/19/2024    Expiration Date:   02/14/2025   CBC with Differential/Platelet    Standing Status:   Future    Expected Date:   03/19/2024    Expiration Date:   02/14/2025   Comprehensive metabolic panel with GFR    Standing Status:   Future    Expected Date:   03/19/2024    Expiration Date:   02/14/2025   Iron and TIBC    Standing Status:   Future    Expected Date:   03/19/2024    Expiration Date:   02/14/2025    The total time spent in the appointment was 20 minutes encounter with patients including review of chart and  various tests results, discussions about plan of care and coordination of care plan   All questions were answered. The patient knows to call the clinic with any problems, questions or concerns. No barriers to learning was detected.  Adam Grade, MD 5/21/202512:47 PM   SUMMARY OF HEMATOLOGIC HISTORY: Iron deficiency anemia-unknown source - S/p IV Feraheme  5 mg X 2 doses on 12/29/2020 and 01/09/2024   INTERVAL HISTORY: Adam Long 48 y.o. male following for iron deficiency anemia. Patient was accompanied by his cousin today.He feels 'all right' following his recent iron infusion. No black colored stools or blood in stools. He notes feeling 'okay' overall but describes a sensation of sluggishness, which he attributes to the weather.  He is also in the process of getting a new leg following an amputation.   I have reviewed the past medical history, past surgical history, social history and family history with the patient   ALLERGIES:  has no known allergies.  MEDICATIONS:  Current Outpatient Medications  Medication Sig Dispense Refill   ascorbic acid  (VITAMIN C ) 1000 MG tablet Take 1 tablet (1,000 mg total) by mouth daily. 30 tablet 0   busPIRone  (BUSPAR ) 7.5 MG tablet Take 1 tablet by mouth twice daily 180 tablet 3  Continuous Glucose Sensor (FREESTYLE LIBRE 3 PLUS SENSOR) MISC as directed.     cyanocobalamin (VITAMIN B12) 1000 MCG tablet Take 1 tablet (1,000 mcg total) by mouth daily. 90 tablet 2   doxycycline  (VIBRA -TABS) 100 MG tablet Take 1 tablet (100 mg total) by mouth 2 (two) times daily. 14 tablet 0   ezetimibe  (ZETIA ) 10 MG tablet Take 1 tablet (10 mg total) by mouth daily. 30 tablet 6   ferrous sulfate  325 (65 FE) MG EC tablet Take 1 tablet (325 mg total) by mouth every other day. 45 tablet 3   gabapentin  (NEURONTIN ) 400 MG capsule Take 2 capsules (800 mg total) by mouth 3 (three) times daily. 540 capsule 1   insulin  glargine (LANTUS  SOLOSTAR) 100 UNIT/ML Solostar Pen  Inject 38 Units into the skin daily. 15 mL 11   Insulin  Pen Needle (BD PEN NEEDLE NANO U/F) 32G X 4 MM MISC Use as directed daily. 100 each 0   leptospermum manuka honey (MEDIHONEY) PSTE paste Apply thin layer to wound daily 44 mL 0   metFORMIN  (GLUMETZA ) 1000 MG (MOD) 24 hr tablet Take 1 tablet (1,000 mg total) by mouth 2 (two) times daily with a meal. 180 tablet 1   methocarbamol  (ROBAXIN ) 500 MG tablet Take 1 tablet (500 mg total) by mouth every 6 (six) hours as needed for muscle spasms. 60 tablet 0   metoprolol  tartrate (LOPRESSOR ) 25 MG tablet Take 1 tablet (25 mg total) by mouth 2 (two) times daily. 180 tablet 3   pantoprazole  (PROTONIX ) 40 MG tablet Take 1 tablet (40 mg total) by mouth daily. 90 tablet 3   rosuvastatin  (CRESTOR ) 10 MG tablet Take 1 tablet (10 mg total) by mouth daily. 90 tablet 3   sertraline  (ZOLOFT ) 50 MG tablet Take 1 tablet (50 mg total) by mouth daily. 90 tablet 3   warfarin (COUMADIN ) 10 MG tablet Take 1 tablet (10 mg total) by mouth daily. 90 tablet 0   Zinc  Sulfate 220 (50 Zn) MG TABS Take 1 tablet (220 mg total) by mouth daily. 30 tablet 0   No current facility-administered medications for this visit.     REVIEW OF SYSTEMS:   Constitutional: Denies fevers, chills or night sweats Eyes: Denies blurriness of vision Ears, nose, mouth, throat, and face: Denies mucositis or sore throat Respiratory: Denies cough, dyspnea or wheezes Cardiovascular: Denies palpitation, chest discomfort or lower extremity swelling Gastrointestinal:  Denies nausea, heartburn or change in bowel habits Skin: Denies abnormal skin rashes Lymphatics: Denies new lymphadenopathy or easy bruising Neurological:Denies numbness, tingling or new weaknesses Behavioral/Psych: Mood is stable, no new changes  All other systems were reviewed with the patient and are negative.  PHYSICAL EXAMINATION:   Vitals:   02/15/24 0918 02/15/24 0923  BP: (!) 146/89 (!) 166/99  Pulse: 95   Resp: 18    Temp: (!) 97.5 F (36.4 C)   SpO2: 95%     GENERAL:alert, no distress and comfortable SKIN: skin color, texture, turgor are normal, no rashes or significant lesions LUNGS: clear to auscultation and percussion with normal breathing effort HEART: regular rate & rhythm and no murmurs and no lower extremity edema ABDOMEN:abdomen soft, non-tender and normal bowel sounds Musculoskeletal:no cyanosis of digits and no clubbing  NEURO: alert & oriented x 3 with fluent speech  LABORATORY DATA:  I have reviewed the data as listed  Lab Results  Component Value Date   WBC 7.7 01/31/2024   NEUTROABS 5.5 01/31/2024   HGB 12.6 (L) 01/31/2024  HCT 40.6 01/31/2024   MCV 76.3 (L) 01/31/2024   PLT 266 01/31/2024     Chemistry      Component Value Date/Time   NA 132 (L) 01/31/2024 1007   NA 136 06/04/2021 0909   K 4.5 01/31/2024 1007   CL 94 (L) 01/31/2024 1007   CO2 24 01/31/2024 1007   BUN 10 01/31/2024 1007   BUN 12 06/04/2021 0909   CREATININE 0.82 01/31/2024 1007   CREATININE 0.74 09/23/2023 1412      Component Value Date/Time   CALCIUM  9.3 01/31/2024 1007   ALKPHOS 38 01/31/2024 1007   Long 48 (H) 01/31/2024 1007   ALT 47 (H) 01/31/2024 1007   BILITOT 0.7 01/31/2024 1007   BILITOT 0.8 06/04/2021 0909       Latest Reference Range & Units 01/31/24 10:07  Iron 45 - 182 ug/dL 35 (L)  UIBC ug/dL 664  TIBC 403 - 474 ug/dL 259  Saturation Ratios 17.9 - 39.5 % 9 (L)  Ferritin 24 - 336 ng/mL 65  Folate >5.9 ng/mL 14.3  (L): Data is abnormally low  RADIOGRAPHIC STUDIES: I have personally reviewed the radiological images as listed and agreed with the findings in the report.  None to review

## 2024-02-23 ENCOUNTER — Inpatient Hospital Stay

## 2024-03-01 ENCOUNTER — Other Ambulatory Visit: Payer: Self-pay | Admitting: Family Medicine

## 2024-03-01 ENCOUNTER — Inpatient Hospital Stay

## 2024-03-19 ENCOUNTER — Encounter: Payer: Self-pay | Admitting: Oncology

## 2024-03-20 ENCOUNTER — Other Ambulatory Visit: Payer: Self-pay | Admitting: Family

## 2024-03-20 DIAGNOSIS — Z89612 Acquired absence of left leg above knee: Secondary | ICD-10-CM

## 2024-03-20 NOTE — Therapy (Unsigned)
 OUTPATIENT PHYSICAL THERAPY LOWER EXTREMITY EVALUATION   Patient Name: Adam Long MRN: [id number] DOB:08/02/1976, 48 y.o., male Today's Date: 03/20/2024  END OF SESSION:   Past Medical History:  Diagnosis Date   Anxiety    Aortic stenosis    Cellulitis and abscess of lower extremity 06/11/2019   Cellulitis of fourth toe of left foot    Cholelithiasis    Coronary artery disease    Nonobstructive CAD (40-50% LAD) 08/2019   Depression    Diabetes mellitus without complication (HCC)    Phreesia 09/27/2020   Elevated troponin level not due myocardial infarction 11/11/2019   Essential hypertension    Gangrene of toe of left foot (HCC) 07/06/2019   Heart murmur    Phreesia 09/27/2020   Hyperlipidemia    Phreesia 09/27/2020   Hypertension    Phreesia 09/27/2020   Mixed hyperlipidemia    Morbid obesity (HCC)    S/P aortic valve replacement with mechanical valve 12/05/2019   25 mm Carbomedics top hat bileaflet mechanical valve via partial upper hemi-sternotomy   Severe aortic stenosis 09/24/2019   Type 2 diabetes mellitus (HCC)    Past Surgical History:  Procedure Laterality Date   ABDOMINAL AORTOGRAM W/LOWER EXTREMITY N/A 07/06/2019   Procedure: ABDOMINAL AORTOGRAM W/LOWER EXTREMITY;  Surgeon: Harvey Carlin BRAVO, MD;  Location: [facility] IN[facility]IVE [facility];  Service: Cardiovascular;  Laterality: N/A;  Bilateral   AMPUTATION Left 07/09/2019   Procedure: LEFT FOURTH and Fifth TOE AMPUTATION.;  Surgeon: Oris Krystal FALCON, MD;  Location: [facility] OR;  Service: Vascular;  Laterality: Left;   AMPUTATION Left 03/11/2021   Procedure: LEFT FOOT 5TH  AND 4TH RAY AMPUTATION;  Surgeon: Harden Jerona GAILS, MD;  Location: [facility] OR;  Service: Orthopedics;  Laterality: Left;   AMPUTATION Left 08/28/2021   Procedure: AMPUTATION BELOW KNEE;  Surgeon: Harden Jerona GAILS, MD;  Location: [facility] OR;  Service: Orthopedics;  Laterality: Left;   AMPUTATION Left 01/29/2023   Procedure: LEFT AMPUTATION BELOW KNEE REVISION AND CLOSURE;   Surgeon: Harden Jerona GAILS, MD;  Location: [facility] OR;  Service: Orthopedics;  Laterality: Left;   AMPUTATION Left 07/27/2023   Procedure: LEFT ABOVE KNEE AMPUTATION;  Surgeon: Harden Jerona GAILS, MD;  Location: [facility] OR;  Service: Orthopedics;  Laterality: Left;   AORTIC VALVE REPLACEMENT N/A 12/05/2019   Procedure: PARTIAL STERNOTOMY FOR AORTIC VALVE REPLACEMENT (AVR), USING CARBOMEDICS SUPRA-ANNULAR TOP HAT ;  Surgeon: Dusty Sudie DEL, MD;  Location: [facility] OR;  Service: Open Heart Surgery;  Laterality: N/A;  No neck lines on left   APPLICATION OF WOUND VAC Left 10/30/2021   Procedure: APPLICATION OF WOUND VAC;  Surgeon: Harden Jerona GAILS, MD;  Location: [facility] OR;  Service: Orthopedics;  Laterality: Left;   APPLICATION OF WOUND VAC Left 12/25/2021   Procedure: APPLICATION OF WOUND VAC;  Surgeon: Harden Jerona GAILS, MD;  Location: [facility] OR;  Service: Orthopedics;  Laterality: Left;   APPLICATION OF WOUND VAC Left 01/26/2023   Procedure: APPLICATION OF WOUND VAC TO LEFT STUMP;  Surgeon: Jerri Kay HERO, MD;  Location: [facility] OR;  Service: Orthopedics;  Laterality: Left;   CARDIAC VALVE REPLACEMENT N/A    Phreesia 09/27/2020   I & D EXTREMITY Left 01/26/2023   Procedure: IRRIGATION AND DEBRIDEMENT OF LEFT BKA STUMP;  Surgeon: Jerri Kay HERO, MD;  Location: [facility] OR;  Service: Orthopedics;  Laterality: Left;   IR RADIOLOGY PERIPHERAL GUIDED IV START  10/05/2019   IR US  GUIDE VASC ACCESS RIGHT  10/05/2019   MULTIPLE EXTRACTIONS WITH  ALVEOLOPLASTY N/A 10/26/2019   Procedure: EXTRACTION OF TOOTH #'S 3, 5-11,19-28,  AND 32 WITH ALVEOLOPLASTY;  Surgeon: Cyndee Tanda FALCON, DDS;  Location: [facility] OR;  Service: Oral Surgery;  Laterality: N/A;   RIGHT HEART CATH AND CORONARY ANGIOGRAPHY N/A 09/24/2019   Procedure: RIGHT HEART CATH AND CORONARY ANGIOGRAPHY;  Surgeon: Wonda Sharper, MD;  Location: [facility] INVASIVE CV LAB;  Service: Cardiovascular;  Laterality: N/A;   STUMP REVISION Left 10/30/2021   Procedure: REVISION LEFT BELOW KNEE AMPUTATION;  Surgeon: Harden Jerona GAILS, MD;  Location: [facility] OR;  Service: Orthopedics;  Laterality: Left;   STUMP REVISION Left 12/25/2021   Procedure: REVISION LEFT BELOW KNEE AMPUTATION;  Surgeon: Harden Jerona GAILS, MD;  Location: [facility] OR;  Service: Orthopedics;  Laterality: Left;   TEE WITHOUT CARDIOVERSION N/A 12/05/2019   Procedure: TRANSESOPHAGEAL ECHOCARDIOGRAM (TEE);  Surgeon: Dusty Sudie DEL, MD;  Location: [facility] OR;  Service: Open Heart Surgery;  Laterality: N/A;   TEE WITHOUT CARDIOVERSION N/A 09/01/2021   Procedure: TRANSESOPHAGEAL ECHOCARDIOGRAM (TEE);  Surgeon: Kate Lonni CROME, MD;  Location: [facility] ENDOSCOPY;  Service: Cardiovascular;  Laterality: N/A;   Patient Active Problem List   Diagnosis Date Noted   Vitamin B12 deficiency 02/15/2024   Iron deficiency anemia 12/22/2023   S/P AKA (above knee amputation), left (HCC) 07/31/2023   Hepatic steatosis 03/25/2023   GERD (gastroesophageal reflux disease) 01/24/2023   History of osteomyelitis 10/12/2022   Uncontrolled type 2 diabetes mellitus with hyperglycemia (HCC) 10/12/2022   CAD (coronary artery disease) 10/12/2022   Encounter for therapeutic drug monitoring 10/12/2022   Anxiety 09/30/2020   S/P aortic valve replacement with mechanical valve 12/05/2019   Hyperlipidemia associated with type 2 diabetes mellitus (HCC) 10/12/2015   Class 3 obesity 10/12/2015   HTN (hypertension) 07/02/2013    PCP: Bluford Jacqulyn MATSU, DO  REFERRING PROVIDER: Valdemar Rocky SAUNDERS, NP  REFERRING DIAG: [phone] (ICD-10-CM) - S/P AKA (above knee amputation), left (HCC)  THERAPY DIAG:  No diagnosis found.  Rationale for Evaluation and Treatment: Rehabilitation  ONSET DATE: ***  SUBJECTIVE:   SUBJECTIVE STATEMENT: ***  PERTINENT HISTORY: *** PAIN:  Are you having pain? {OPRCPAIN:27236}  PRECAUTIONS: None  RED FLAGS: {PT Red Flags:29287}   WEIGHT BEARING RESTRICTIONS: No  FALLS:  Has patient fallen in last 6 months? {fallsyesno:27318}  LIVING ENVIRONMENT: Lives with:  {OPRC lives with:25569::lives with their family} Lives in: {Lives in:25570} Stairs: {opstairs:27293} Has following equipment at home: {Assistive devices:23999}  OCCUPATION: ***  PLOF: {PLOF:24004}  PATIENT GOALS: ***  NEXT MD VISIT: ***  OBJECTIVE:  Note: Objective measures were completed at Evaluation unless otherwise noted.  DIAGNOSTIC FINDINGS: ***  PATIENT SURVEYS:  LEFS {:PHR,OPRCLEFSTABLE}   COGNITION: Overall cognitive status: {cognition:24006}     SENSATION: {sensation:27233}  EDEMA:  {edema:24020}  MUSCLE LENGTH: Hamstrings: Right *** deg; Left *** deg Debby test: Right *** deg; Left *** deg  POSTURE: {posture:25561}  PALPATION: ***  LOWER EXTREMITY ROM:  {AROM/PROM:27142} ROM Right eval Left eval  Hip flexion    Hip extension    Hip abduction    Hip adduction    Hip internal rotation    Hip external rotation    Knee flexion    Knee extension    Ankle dorsiflexion    Ankle plantarflexion    Ankle inversion    Ankle eversion     (Blank rows = not tested)  LOWER EXTREMITY MMT:  MMT Right eval Left eval  Hip flexion    Hip extension    Hip  abduction    Hip adduction    Hip internal rotation    Hip external rotation    Knee flexion    Knee extension    Ankle dorsiflexion    Ankle plantarflexion    Ankle inversion    Ankle eversion     (Blank rows = not tested)  LOWER EXTREMITY SPECIAL TESTS:  {LEspecialtests:26242}  FUNCTIONAL TESTS:  {Functional tests:24029}  GAIT: Distance walked: *** Assistive device utilized: {Assistive devices:23999} Level of assistance: {Levels of assistance:24026} Comments: ***                                                                                                                                TREATMENT DATE:  03/21/24: PT Eval and HEP    PATIENT EDUCATION:  Education details: PT evaluation, objective findings, POC, Importance of HEP, Precautions, Clinic policies Person educated:  Patient Education method: Explanation and Demonstration Education comprehension: verbalized understanding and returned demonstration  HOME EXERCISE PROGRAM: ***  ASSESSMENT:  CLINICAL IMPRESSION: Patient is a 48 y.o. male who was seen today for physical therapy evaluation and treatment for Z89.612 (ICD-10-CM) - S/P AKA (above knee amputation), left (HCC).   OBJECTIVE IMPAIRMENTS: {opptimpairments:25111}.   ACTIVITY LIMITATIONS: {activitylimitations:27494}  PARTICIPATION LIMITATIONS: {participationrestrictions:25113}  PERSONAL FACTORS: {Personal factors:25162} are also affecting patient's functional outcome.   REHAB POTENTIAL: {rehabpotential:25112}  CLINICAL DECISION MAKING: {clinical decision making:25114}  EVALUATION COMPLEXITY: {Evaluation complexity:25115}   GOALS: Goals reviewed with patient? Yes  SHORT TERM GOALS: Target date: 04/04/24 Patient will be independent with performance of HEP to demonstrate adequate self management of symptoms.  Baseline:  Goal status: INITIAL  2.   Patient will report at least a 25% improvement with function or pain overall since beginning PT. Baseline:  Goal status: INITIAL   LONG TERM GOALS: Target date: 05/16/24  *** Baseline:  Goal status: INITIAL  2.  *** Baseline:  Goal status: INITIAL  3.  *** Baseline:  Goal status: INITIAL  4.  *** Baseline:  Goal status: INITIAL  5.  *** Baseline:  Goal status: INITIAL  6.  *** Baseline:  Goal status: INITIAL   PLAN:  PT FREQUENCY: 2x/week  PT DURATION: 8 weeks  PLANNED INTERVENTIONS: 97164- PT Re-evaluation, 97110-Therapeutic exercises, 97530- Therapeutic activity, [id number]- Neuromuscular re-education, 97535- Self Care, 02859- Manual therapy, [id number]- Gait training, [id number]- Prosthetic Initial , [id number]- Electrical stimulation (manual), [id number]- Traction (mechanical), 20560 (1-2 muscles), 20561 (3+ muscles)- Dry Needling, Patient/Family education, Balance training, Stair  training, Taping, Joint mobilization, Spinal mobilization, DME instructions, Cryotherapy, and Moist heat  PLAN FOR NEXT SESSION: ***   12:17 PM, 03/20/24 Rosaria Settler, PT, DPT [facility]

## 2024-03-21 ENCOUNTER — Ambulatory Visit (HOSPITAL_COMMUNITY)

## 2024-03-23 NOTE — Patient Instructions (Signed)

## 2024-03-26 ENCOUNTER — Encounter: Payer: Self-pay | Admitting: Nurse Practitioner

## 2024-03-26 ENCOUNTER — Ambulatory Visit (INDEPENDENT_AMBULATORY_CARE_PROVIDER_SITE_OTHER): Payer: Self-pay | Admitting: Nurse Practitioner

## 2024-03-26 VITALS — BP 124/80 | HR 103 | Wt 340.0 lb

## 2024-03-26 DIAGNOSIS — I10 Essential (primary) hypertension: Secondary | ICD-10-CM

## 2024-03-26 DIAGNOSIS — E782 Mixed hyperlipidemia: Secondary | ICD-10-CM

## 2024-03-26 DIAGNOSIS — Z794 Long term (current) use of insulin: Secondary | ICD-10-CM

## 2024-03-26 DIAGNOSIS — Z7984 Long term (current) use of oral hypoglycemic drugs: Secondary | ICD-10-CM

## 2024-03-26 DIAGNOSIS — E1165 Type 2 diabetes mellitus with hyperglycemia: Secondary | ICD-10-CM

## 2024-03-26 LAB — POCT GLYCOSYLATED HEMOGLOBIN (HGB A1C): Hemoglobin A1C: 11.9 % — AB (ref 4.0–5.6)

## 2024-03-26 MED ORDER — LANTUS SOLOSTAR 100 UNIT/ML ~~LOC~~ SOPN: 38.0000 [IU] | PEN_INJECTOR | Freq: Every day | SUBCUTANEOUS | 11 refills | Status: DC

## 2024-03-26 MED ORDER — FREESTYLE LIBRE 3 PLUS SENSOR MISC: 3 refills | Status: AC

## 2024-03-26 MED ORDER — METFORMIN HCL ER (MOD) 1000 MG PO TB24: 1000.0000 mg | ORAL_TABLET | Freq: Every day | ORAL | 1 refills | Status: DC

## 2024-03-26 MED ORDER — BD PEN NEEDLE NANO U/F 32G X 4 MM MISC: 3 refills | Status: AC

## 2024-03-26 NOTE — Progress Notes (Signed)
 03/26/2024  Endocrinology Consultation Note   Subjective:    Patient ID: Adam Long, male    DOB: 1975/11/26. Patient is being seen in consultation for management of currently uncontrolled, complicated type 2 diabetes.   He disappeared from care for nearly 3 years, is re-establishing care today.  PCP:   Bluford Jacqulyn MATSU, DO   Past Medical History:  Diagnosis Date   Anxiety    Aortic stenosis    Cellulitis and abscess of lower extremity 06/11/2019   Cellulitis of fourth toe of left foot    Cholelithiasis    Coronary artery disease    Nonobstructive CAD (40-50% LAD) 08/2019   Depression    Diabetes mellitus without complication (HCC)    Phreesia 09/27/2020   Elevated troponin level not due myocardial infarction 11/11/2019   Essential hypertension    Gangrene of toe of left foot (HCC) 07/06/2019   Heart murmur    Phreesia 09/27/2020   Hyperlipidemia    Phreesia 09/27/2020   Hypertension    Phreesia 09/27/2020   Mixed hyperlipidemia    Morbid obesity (HCC)    S/P aortic valve replacement with mechanical valve 12/05/2019   25 mm Carbomedics top hat bileaflet mechanical valve via partial upper hemi-sternotomy   Severe aortic stenosis 09/24/2019   Type 2 diabetes mellitus (HCC)    Past Surgical History:  Procedure Laterality Date   ABDOMINAL AORTOGRAM W/LOWER EXTREMITY N/A 07/06/2019   Procedure: ABDOMINAL AORTOGRAM W/LOWER EXTREMITY;  Surgeon: Harvey Carlin BRAVO, MD;  [facility]: [facility] INVASIVE CV LAB;  Service: Cardiovascular;  Laterality: N/A;  Bilateral   AMPUTATION Left 07/09/2019   Procedure: LEFT FOURTH and Fifth TOE AMPUTATION.;  Surgeon: Oris Krystal FALCON, MD;  Location: [facility] OR;  Service: Vascular;  Laterality: Left;   AMPUTATION Left 03/11/2021   Procedure: LEFT FOOT 5TH  AND 4TH RAY AMPUTATION;  Surgeon: Harden Jerona GAILS, MD;  Location: [facility] OR;  Service: Orthopedics;  Laterality: Left;   AMPUTATION Left 08/28/2021   Procedure: AMPUTATION BELOW KNEE;  Surgeon: Harden Jerona GAILS,  MD;  Location: [facility] OR;  Service: Orthopedics;  Laterality: Left;   AMPUTATION Left 01/29/2023   Procedure: LEFT AMPUTATION BELOW KNEE REVISION AND CLOSURE;  Surgeon: Harden Jerona GAILS, MD;  Location: [facility] OR;  Service: Orthopedics;  Laterality: Left;   AMPUTATION Left 07/27/2023   Procedure: LEFT ABOVE KNEE AMPUTATION;  Surgeon: Harden Jerona GAILS, MD;  Location: [facility] OR;  Service: Orthopedics;  Laterality: Left;   AORTIC VALVE REPLACEMENT N/A 12/05/2019   Procedure: PARTIAL STERNOTOMY FOR AORTIC VALVE REPLACEMENT (AVR), USING CARBOMEDICS SUPRA-ANNULAR TOP HAT ;  Surgeon: Dusty Sudie DEL, MD;  Location: [facility] OR;  Service: Open Heart Surgery;  Laterality: N/A;  No neck lines on left   APPLICATION OF WOUND VAC Left 10/30/2021   Procedure: APPLICATION OF WOUND VAC;  Surgeon: Harden Jerona GAILS, MD;  Location: [facility] OR;  Service: Orthopedics;  Laterality: Left;   APPLICATION OF WOUND VAC Left 12/25/2021   Procedure: APPLICATION OF WOUND VAC;  Surgeon: Harden Jerona GAILS, MD;  Location: [facility] OR;  Service: Orthopedics;  Laterality: Left;   APPLICATION OF WOUND VAC Left 01/26/2023   Procedure: APPLICATION OF WOUND VAC TO LEFT STUMP;  Surgeon: Jerri Kay HERO, MD;  Location: [facility] OR;  Service: Orthopedics;  Laterality: Left;   CARDIAC VALVE REPLACEMENT N/A    Phreesia 09/27/2020   I & D EXTREMITY Left 01/26/2023   Procedure: IRRIGATION AND DEBRIDEMENT OF LEFT BKA STUMP;  Surgeon: Jerri Kay HERO, MD;  Location:  [facility] OR;  Service: Orthopedics;  Laterality: Left;   IR RADIOLOGY PERIPHERAL GUIDED IV START  10/05/2019   IR US  GUIDE VASC ACCESS RIGHT  10/05/2019   MULTIPLE EXTRACTIONS WITH ALVEOLOPLASTY N/A 10/26/2019   Procedure: EXTRACTION OF TOOTH #'S 3, 5-11,19-28,  AND 32 WITH ALVEOLOPLASTY;  Surgeon: Cyndee Tanda FALCON, DDS;  Location: [facility] OR;  Service: Oral Surgery;  Laterality: N/A;   RIGHT HEART CATH AND CORONARY ANGIOGRAPHY N/A 09/24/2019   Procedure: RIGHT HEART CATH AND CORONARY ANGIOGRAPHY;  Surgeon: Wonda Sharper, MD;  Location: [facility]  INVASIVE CV LAB;  Service: Cardiovascular;  Laterality: N/A;   STUMP REVISION Left 10/30/2021   Procedure: REVISION LEFT BELOW KNEE AMPUTATION;  Surgeon: Harden Jerona GAILS, MD;  Location: [facility] OR;  Service: Orthopedics;  Laterality: Left;   STUMP REVISION Left 12/25/2021   Procedure: REVISION LEFT BELOW KNEE AMPUTATION;  Surgeon: Harden Jerona GAILS, MD;  Location: [facility] OR;  Service: Orthopedics;  Laterality: Left;   TEE WITHOUT CARDIOVERSION N/A 12/05/2019   Procedure: TRANSESOPHAGEAL ECHOCARDIOGRAM (TEE);  Surgeon: Dusty Sudie DEL, MD;  Location: [facility] OR;  Service: Open Heart Surgery;  Laterality: N/A;   TEE WITHOUT CARDIOVERSION N/A 09/01/2021   Procedure: TRANSESOPHAGEAL ECHOCARDIOGRAM (TEE);  Surgeon: Kate Lonni CROME, MD;  Location: [facility] ENDOSCOPY;  Service: Cardiovascular;  Laterality: N/A;   Social History   Socioeconomic History   Marital status: Single    Spouse name: Not on file   Number of children: Not on file   Years of education: Not on file   Highest education level: Some college, no degree  Occupational History    Comment: Glass blower/designer- self-employed  Tobacco Use   Smoking status: Never   Smokeless tobacco: Former    Types: Chew    Quit date: 2021  Vaping Use   Vaping status: Never Used  Substance and Sexual Activity   Alcohol use: Yes    Alcohol/week: 4.0 standard drinks of alcohol    Types: 4 Cans of beer per week    Comment: 3 days a week.   Drug use: No   Sexual activity: Yes    Birth control/protection: None  Other Topics Concern   Not on file  Social History Narrative   Lives with his friend.    Social Drivers of Corporate investment banker Strain: Low Risk  (09/22/2023)   Overall Financial Resource Strain (CARDIA)    Difficulty of Paying Living Expenses: Not hard at all  Food Insecurity: No Food Insecurity (09/22/2023)   Hunger Vital Sign    Worried About Running Out of Food in the Last Year: Never true    Ran Out of Food in the Last Year: Never true   Transportation Needs: No Transportation Needs (09/22/2023)   PRAPARE - Administrator, Civil Service (Medical): No    Lack of Transportation (Non-Medical): No  Physical Activity: Unknown (09/22/2023)   Exercise Vital Sign    Days of Exercise per Week: 0 days    Minutes of Exercise per Session: Not on file  Recent Concern: Physical Activity - Inactive (09/22/2023)   Exercise Vital Sign    Days of Exercise per Week: 0 days    Minutes of Exercise per Session: 10 min  Stress: Stress Concern Present (09/22/2023)   [employer] of Occupational Health - Occupational Stress Questionnaire    Feeling of Stress : Very much  Social Connections: Socially Isolated (09/22/2023)   Social Connection and Isolation Panel    Frequency of Communication with  Friends and Family: More than three times a week    Frequency of Social Gatherings with Friends and Family: Twice a week    Attends Religious Services: Never    Database administrator or Organizations: No    Attends Engineer, structural: Not on file    Marital Status: Never married   Outpatient Encounter Medications as of 03/26/2024  Medication Sig   ascorbic acid  (VITAMIN C ) 1000 MG tablet Take 1 tablet (1,000 mg total) by mouth daily.   busPIRone  (BUSPAR ) 7.5 MG tablet Take 1 tablet by mouth twice daily   cyanocobalamin  (VITAMIN B12) 1000 MCG tablet Take 1 tablet (1,000 mcg total) by mouth daily.   ezetimibe  (ZETIA ) 10 MG tablet Take 1 tablet (10 mg total) by mouth daily.   ferrous sulfate  325 (65 FE) MG EC tablet Take 1 tablet (325 mg total) by mouth every other day.   gabapentin  (NEURONTIN ) 400 MG capsule Take 2 capsules (800 mg total) by mouth 3 (three) times daily.   metoprolol  tartrate (LOPRESSOR ) 25 MG tablet Take 1 tablet (25 mg total) by mouth 2 (two) times daily.   pantoprazole  (PROTONIX ) 40 MG tablet Take 1 tablet (40 mg total) by mouth daily.   rosuvastatin  (CRESTOR ) 10 MG tablet Take 1 tablet (10 mg total) by  mouth daily.   sertraline  (ZOLOFT ) 50 MG tablet Take 1 tablet (50 mg total) by mouth daily.   warfarin (COUMADIN ) 10 MG tablet Take 1 tablet (10 mg total) by mouth daily.   Zinc  Sulfate 220 (50 Zn) MG TABS Take 1 tablet (220 mg total) by mouth daily.   [DISCONTINUED] insulin  glargine (LANTUS  SOLOSTAR) 100 UNIT/ML Solostar Pen Inject 38 Units into the skin daily.   [DISCONTINUED] Insulin  Pen Needle (BD PEN NEEDLE NANO U/F) 32G X 4 MM MISC Use as directed daily.   Continuous Glucose Sensor (FREESTYLE LIBRE 3 PLUS SENSOR) MISC Change sensor every 15 days.   insulin  glargine (LANTUS  SOLOSTAR) 100 UNIT/ML Solostar Pen Inject 38 Units into the skin daily.   Insulin  Pen Needle (BD PEN NEEDLE NANO U/F) 32G X 4 MM MISC Use to inject insulin  once daily   leptospermum manuka honey (MEDIHONEY) PSTE paste Apply thin layer to wound daily   metFORMIN  (GLUMETZA ) 1000 MG (MOD) 24 hr tablet Take 1 tablet (1,000 mg total) by mouth daily with breakfast.   [DISCONTINUED] Continuous Glucose Sensor (FREESTYLE LIBRE 3 PLUS SENSOR) MISC USE TO CHECK GLUCOSE CONTINUOUSLY AS DIRECTED CHANGING SENSOR EVERY 15 DAYS (Patient not taking: Reported on 03/26/2024)   [DISCONTINUED] metFORMIN  (GLUMETZA ) 1000 MG (MOD) 24 hr tablet Take 1 tablet (1,000 mg total) by mouth 2 (two) times daily with a meal. (Patient not taking: Reported on 03/26/2024)   [DISCONTINUED] methocarbamol  (ROBAXIN ) 500 MG tablet Take 1 tablet (500 mg total) by mouth every 6 (six) hours as needed for muscle spasms.   No facility-administered encounter medications on file as of 03/26/2024.   ALLERGIES: No Known Allergies  VACCINATION STATUS: Immunization History  Administered Date(s) Administered   Influenza Split 07/01/2015, 06/30/2016   Influenza, Seasonal, Injecte, Preservative Fre 07/31/2023   Influenza,inj,Quad PF,6+ Mos 06/30/2017, 07/30/2018, 06/12/2019, 06/07/2020, 08/29/2021   Moderna Sars-Covid-2 Vaccination 01/03/2020, 02/05/2020, 09/17/2020    PNEUMOCOCCAL CONJUGATE-20 01/25/2023   Pneumococcal Polysaccharide-23 11/04/2020   Tdap 06/11/2019    Diabetes He presents for his follow-up diabetic visit. He has type 2 diabetes mellitus. Onset time: He was diagnosed at approximate age of 35 years. His disease course has been worsening. There are  no hypoglycemic associated symptoms. Pertinent negatives for hypoglycemia include no confusion, headaches, pallor or seizures. Associated symptoms include fatigue, polydipsia and polyuria. Pertinent negatives for diabetes include no blurred vision, no chest pain, no polyphagia and no weakness. There are no hypoglycemic complications. Symptoms are stable. Diabetic complications include heart disease and PVD. (Peripheral artery disease complicated by partial amputation of left foot.  Since his last visit, he underwent aortic valve replacement.) Risk factors for coronary artery disease include diabetes mellitus, dyslipidemia, family history, obesity, male sex, hypertension and sedentary lifestyle. Current diabetic treatment includes insulin  injections and oral agent (monotherapy). He is compliant with treatment most of the time. His weight is stable. He is following a generally unhealthy diet. When asked about meal planning, he reported none. He has not had a previous visit with a dietitian. He never participates in exercise. (He presents today, accompanied by his cousin and nephew, with no meter or logs to review.  He was prescribed a libre 3 but has trouble getting the sensors to stay.  His POCT A1c today is 11.9%, increasing from last A1c of 10.7% in March.  He admits he could do better with diet and exercise (had Left AKA since last visit but is working with PT so he can eventually use his prosthesis). ) An ACE inhibitor/angiotensin II receptor blocker is being taken. He sees a podiatrist.Eye exam is current.  Hyperlipidemia This is a chronic problem. The current episode started more than 1 year ago. The problem  is controlled. Recent lipid tests were reviewed and are normal. Exacerbating diseases include diabetes and obesity. Pertinent negatives include no chest pain, myalgias or shortness of breath. Current antihyperlipidemic treatment includes statins. The current treatment provides moderate improvement of lipids. Compliance problems include adherence to exercise.  Risk factors for coronary artery disease include diabetes mellitus, dyslipidemia, hypertension, male sex, obesity, a sedentary lifestyle and family history.  Hypertension This is a chronic problem. The current episode started more than 1 year ago. The problem has been gradually improving since onset. The problem is controlled. Pertinent negatives include no blurred vision, chest pain, headaches, neck pain, palpitations or shortness of breath. There are no associated agents to hypertension. Risk factors for coronary artery disease include diabetes mellitus, dyslipidemia, male gender, sedentary lifestyle and obesity. Past treatments include beta blockers and ACE inhibitors. The current treatment provides moderate improvement. There are no compliance problems.  Hypertensive end-organ damage includes PVD.   Review of systems  Constitutional: + stable body weight,  current Body mass index is 48.78 kg/m. , no fatigue, no subjective hyperthermia, no subjective hypothermia Eyes: no blurry vision, no xerophthalmia ENT: no sore throat, no nodules palpated in throat, no dysphagia/odynophagia, no hoarseness Cardiovascular: no chest pain, no shortness of breath, no palpitations, no leg swelling Respiratory: no cough, no shortness of breath Gastrointestinal: no nausea/vomiting/diarrhea Musculoskeletal: no muscle/joint aches, WC bound for L-AKA Skin: no rashes, no hyperemia Neurological: no tremors, no numbness, no tingling, no dizziness Psychiatric: no depression, no anxiety  Objective:    BP 124/80 (BP Location: Left Arm, Patient Position: Sitting, Cuff  Size: Large)   Pulse (!) 103   Wt (!) 340 lb (154.2 kg) Comment: patient states that this is his last weight about 2 months ago, patient is an amputee.  BMI 48.78 kg/m   Wt Readings from Last 3 Encounters:  03/26/24 (!) 340 lb (154.2 kg)  12/30/23 (!) 330 lb (149.7 kg)  12/26/23 (!) 340 lb (154.2 kg)    BP Readings from Last  3 Encounters:  03/26/24 124/80  02/15/24 (!) 166/99  01/09/24 (!) 164/79     Physical Exam- Limited  Constitutional:  Body mass index is 48.78 kg/m. , not in acute distress, normal state of mind Eyes:  EOMI, no exophthalmos Musculoskeletal: L-AKA- essentially WC bound- still working with PT Skin:  no rashes, no hyperemia Neurological: no tremor with outstretched hands   CMP ( most recent) CMP     Component Value Date/Time   NA 132 (L) 01/31/2024 1007   NA 136 06/04/2021 0909   K 4.5 01/31/2024 1007   CL 94 (L) 01/31/2024 1007   CO2 24 01/31/2024 1007   GLUCOSE 273 (H) 01/31/2024 1007   BUN 10 01/31/2024 1007   BUN 12 06/04/2021 0909   CREATININE 0.82 01/31/2024 1007   CREATININE 0.74 09/23/2023 1412   CALCIUM  9.3 01/31/2024 1007   PROT 7.3 01/31/2024 1007   PROT 7.3 06/04/2021 0909   ALBUMIN  3.7 01/31/2024 1007   ALBUMIN  3.8 (L) 06/04/2021 0909   AST 48 (H) 01/31/2024 1007   ALT 47 (H) 01/31/2024 1007   ALKPHOS 38 01/31/2024 1007   BILITOT 0.7 01/31/2024 1007   BILITOT 0.8 06/04/2021 0909   GFRNONAA >60 01/31/2024 1007   GFRNONAA 112 06/13/2020 0837   GFRAA 126 10/31/2020 0944   GFRAA 129 06/13/2020 0837     Diabetic Labs (most recent): Lab Results  Component Value Date   HGBA1C 11.9 (A) 03/26/2024   HGBA1C 10.7 (H) 12/13/2023   HGBA1C 10.2 (H) 07/26/2023   MICROALBUR 12.0 12/13/2023   MICROALBUR 0.4 06/13/2020   MICROALBUR 18.4 (H) 06/28/2019     Lipid Panel ( most recent) Lipid Panel     Component Value Date/Time   CHOL 170 12/13/2023 1201   CHOL 242 (H) 04/27/2022 1033   TRIG 349 (H) 12/13/2023 1201   HDL 41  12/13/2023 1201   HDL 37 (L) 04/27/2022 1033   CHOLHDL 4.1 12/13/2023 1201   VLDL 14 07/17/2020 0837   LDLCALC 87 12/13/2023 1201      Assessment & Plan:   1) Uncontrolled type 2 diabetes mellitus with complication, unspecified whether long term insulin  use (HCC)  - Patient has currently uncontrolled symptomatic type 2 DM since  48 years of age.  He presents today, accompanied by his cousin and nephew, with no meter or logs to review.  He was prescribed a libre 3 but has trouble getting the sensors to stay.  His POCT A1c today is 11.9%, increasing from last A1c of 10.7% in March.  He admits he could do better with diet and exercise (had Left AKA since last visit but is working with PT so he can eventually use his prosthesis).  -He is recent labs are reviewed with him.    His diabetes is recently complicated by peripheral arterial disease with partial amputation of left foot, obesity/sedentary life, noncompliance/nonadherence, lack of proper insurance for medications and doctors visits and patient remains at a high risk for more acute and chronic complications of diabetes which include CAD, CVA, CKD, retinopathy, and neuropathy. These are all discussed in detail with the patient.  The following Lifestyle Medicine recommendations according to American College of Lifestyle Medicine [facility]) were discussed and offered to patient and he agrees to start the journey:  A. Whole Foods, Plant-based plate comprising of fruits and vegetables, plant-based proteins, whole-grain carbohydrates was discussed in detail with the patient.   A list for source of those nutrients were also provided to the patient.  Patient  will use only water or unsweetened tea for hydration. B.  The need to stay away from risky substances including alcohol, smoking; obtaining 7 to 9 hours of restorative sleep, at least 150 minutes of moderate intensity exercise weekly, the importance of healthy social connections,  and stress  reduction techniques were discussed. C.  A full color page of  Calorie density of various food groups per pound showing examples of each food groups was provided to the patient.  - Nutritional counseling repeated at each appointment due to patients tendency to fall back in to old habits.  - The patient admits there is a room for improvement in their diet and drink choices. -  Suggestion is made for the patient to avoid simple carbohydrates from their diet including Cakes, Sweet Desserts / Pastries, Ice Cream, Soda (diet and regular), Sweet Tea, Candies, Chips, Cookies, Sweet Pastries, Store Bought Juices, Alcohol in Excess of 1-2 drinks a day, Artificial Sweeteners, Coffee Creamer, and Sugar-free Products. This will help patient to have stable blood glucose profile and potentially avoid unintended weight gain.   - I encouraged the patient to switch to unprocessed or minimally processed complex starch and increased protein intake (animal or plant source), fruits, and vegetables.   - Patient is advised to stick to a routine mealtimes to eat 3 meals a day and avoid unnecessary snacks (to snack only to correct hypoglycemia).  - I have approached patient with the following individualized plan to manage diabetes and patient agrees:   -No significant changes will be made to his regimen today due to lack of logs for safety purposes.  He is advised to continue Lantus  38 units SQ nightly.  Will lower Metformin  to 1000 mg ER once daily (instead of BID)- reports he has been having multiple loose stools per day.  -He is advised to start using his CGM to check glucose 4 times daily, before meals and before bed, and to call the clinic if he has readings less than 70 or above 300 for 3 tests in a row.  - Patient specific target  A1c;  LDL, HDL, Triglycerides, were discussed in detail.  2) BP/HTN:  His blood pressure is controlled to target.  He is advised to continue meds as prescribed by PCP.  3)  Lipids/HPL:   His most recent lipid panel from 12/13/23 showed controlled LDL at 87 and elevated triglycerides of 349.  He is advised to continue Crestor  10 mg po daily at bedtime.  Side effects and precautions discussed with him.    4)  Weight/Diet:  His Body mass index is 48.78 kg/m.-clearly complicating his diabetes care.  He is working on losing weight, has lost some but gained some back. He is still  a candidate for major weight loss.  I discussed with him the fact that loss of 5-10 percent of his body weight will impact his diabetes significantly.  He is a candidate for bariatric surgery. We will explore this option later.  CDE Consult has been initiated;  exercise, and detailed carbohydrates information provided.  5) Chronic Care/Health Maintenance: -Patient is on Statin medications and encouraged to continue to follow up with Ophthalmology, Podiatrist at least yearly or according to recommendations, and advised to   stay away from smoking. I have recommended yearly flu vaccine and pneumonia vaccination at least every 5 years; moderate intensity exercise for up to 150 minutes weekly; and  sleep for at least 7 hours a day.  - I advised patient to maintain close follow  up with Cook, Jayce G, DO for primary care needs.    I spent  60  minutes in the care of the patient today including review of labs from CMP, Lipids, Thyroid Function, Hematology (current and previous including abstractions from other facilities); face-to-face time discussing  his blood glucose readings/logs, discussing hypoglycemia and hyperglycemia episodes and symptoms, medications doses, his options of short and long term treatment based on the latest standards of care / guidelines;  discussion about incorporating lifestyle medicine;  and documenting the encounter. Risk reduction counseling performed per USPSTF guidelines to reduce obesity and cardiovascular risk factors.     Please refer to Patient Instructions for Blood  Glucose Monitoring and Insulin /Medications Dosing Guide  in media tab for additional information. Please  also refer to  Patient Self Inventory in the Media  tab for reviewed elements of pertinent patient history.  Vinie JONELLE Lever participated in the discussions, expressed understanding, and voiced agreement with the above plans.  All questions were answered to his satisfaction. he is encouraged to contact clinic should he have any questions or concerns prior to his return visit.   Follow up plan: - Return in about 3 months (around 06/26/2024) for Diabetes F/U with A1c in office, No previsit labs, Bring meter and logs.  Benton Rio, [facility] [address] [place], [place] [address] Phone: [phone] Fax: [phone]  03/26/2024, 2:40 PM

## 2024-03-27 ENCOUNTER — Ambulatory Visit: Attending: Cardiology | Admitting: *Deleted

## 2024-03-27 DIAGNOSIS — Z5181 Encounter for therapeutic drug level monitoring: Secondary | ICD-10-CM | POA: Diagnosis present

## 2024-03-27 DIAGNOSIS — Z954 Presence of other heart-valve replacement: Secondary | ICD-10-CM | POA: Diagnosis present

## 2024-03-27 DIAGNOSIS — Z7901 Long term (current) use of anticoagulants: Secondary | ICD-10-CM | POA: Insufficient documentation

## 2024-03-27 LAB — POCT INR: INR: 2.8 (ref 2.0–3.0)

## 2024-03-27 NOTE — Patient Instructions (Signed)
Continue warfarin 1 tablet daily expcet for 1/2 tablet on Monday, Wednesday and Friday. Recheck in 6 wks.

## 2024-03-27 NOTE — Progress Notes (Signed)
Please see anticoagulation encounter.

## 2024-03-28 ENCOUNTER — Inpatient Hospital Stay

## 2024-03-29 ENCOUNTER — Other Ambulatory Visit (HOSPITAL_COMMUNITY): Payer: Self-pay

## 2024-03-29 ENCOUNTER — Telehealth: Payer: Self-pay

## 2024-03-29 NOTE — Telephone Encounter (Signed)
 Pharmacy Patient Advocate Encounter   Received notification from CoverMyMeds that prior authorization for Metformin  1000mg  is required/requested.   Insurance verification completed.   The patient is insured through [employer] .   Per test claim: The current 30 day co-pay is, $4.  No PA needed at this time. This test claim was processed through [facility]- copay amounts may vary at other pharmacies due to pharmacy/plan contracts, or as the patient moves through the different stages of their insurance plan.     Insurance will only cover 30 days at a time

## 2024-04-04 ENCOUNTER — Inpatient Hospital Stay: Admitting: Oncology

## 2024-04-05 ENCOUNTER — Inpatient Hospital Stay: Attending: Hematology

## 2024-04-05 VITALS — BP 152/99 | HR 90 | Temp 97.2°F | Resp 20

## 2024-04-05 DIAGNOSIS — D509 Iron deficiency anemia, unspecified: Secondary | ICD-10-CM | POA: Insufficient documentation

## 2024-04-05 MED ORDER — SODIUM CHLORIDE 0.9 % IV SOLN
510.0000 mg | Freq: Once | INTRAVENOUS | Status: AC
  Administered 2024-04-05: 510 mg via INTRAVENOUS
  Filled 2024-04-05: qty 510

## 2024-04-05 MED ORDER — SODIUM CHLORIDE 0.9 % IV SOLN: INTRAVENOUS | Status: DC

## 2024-04-05 NOTE — Progress Notes (Signed)
 Patient tolerated iron infusion with no complaints voiced.  Peripheral IV site clean and dry with good blood return noted before and after infusion.  Band aid applied.  VSS with discharge and left in satisfactory condition with no s/s of distress noted.

## 2024-04-05 NOTE — Patient Instructions (Signed)

## 2024-04-13 ENCOUNTER — Inpatient Hospital Stay

## 2024-04-13 VITALS — BP 150/86 | HR 86 | Temp 98.3°F | Resp 18

## 2024-04-13 DIAGNOSIS — D509 Iron deficiency anemia, unspecified: Secondary | ICD-10-CM

## 2024-04-13 MED ORDER — SODIUM CHLORIDE 0.9 % IV SOLN: INTRAVENOUS | Status: DC

## 2024-04-13 MED ORDER — SODIUM CHLORIDE 0.9 % IV SOLN
510.0000 mg | Freq: Once | INTRAVENOUS | Status: AC
  Administered 2024-04-13: 510 mg via INTRAVENOUS
  Filled 2024-04-13: qty 17

## 2024-04-13 NOTE — Progress Notes (Signed)
   04/13/24 1200  Spiritual Encounters  Type of Visit Initial  Care provided to: Pt and family (Cousin Hydrographic surveyor))  Referral source Other (comment) (Chaplain making rounds)  Reason for visit  (Spiritual Care Education)  OnCall Visit No  Spiritual Framework  Presenting Themes Meaning/purpose/sources of inspiration;Goals in life/care;Values and beliefs;Significant life change;Caregiving needs;Coping tools;Impactful experiences and emotions;Courage hope and growth  Patient Stress Factors Health changes;Loss of control;Major life changes (Loss of Independence)  Family Stress Factors Health changes;Major life changes  Interventions  Spiritual Care Interventions Made Established relationship of care and support;Compassionate presence;Reflective listening;Narrative/life review;Explored values/beliefs/practices/strengths;Encouragement  Intervention Outcomes  Outcomes Connection to spiritual care;Awareness around self/spiritual resourses;Awareness of support;Reduced isolation  Spiritual Care Plan  Spiritual Care Issues Still Outstanding Chaplain will continue to follow   Reason for Visit: Chaplain making rounds on the floor visiting infusion Pts  Description of Visit: Arriving in the room I found Adam Long seated in a recliner receiving iron infusion, with his cousin Adam Long present for support.  I introduced myself as the chaplain for the cancer center and offered a brief education on the role of a chaplain and the support we can offer to our patients, caregivers, and staff.  I began a conversation with them, and they was receptive to talking with me.  Conversation with Adam Long began to flow naturally and I facilitated exploration of his health journey.  Adam Long shared a brief review of his multi-stage amputation of his leg, and now the difficult finding where he is losing blood.  We processed the emotions of frustration and powerlessness he is experiencing as this journey continues.  I explored his  emotional, relational, and spiritual needs and resources along with his strengths.  I find that Adam Long conveys resilience and courage in the face of difficulty.  He and his cousin both use humor and laughing to cope with what frustrates and exhausts them.  Adam Long has strong support from his cousin and her teenage sons, who are available to him and appear to genuinely love him  Adam Long mentioned prayer at one point, but faith was not a subject he spoke much on.  This represents an area to be explored in the future.  Plan of Care: IN my time with the Pt I assessed that further spiritual care interventions would be helpful to both Adam Long and his caregiver Adam Long, MDiv  Chaplain, [facility] [email or web address] [phone]

## 2024-04-13 NOTE — Patient Instructions (Signed)
 [facility] - A DEPT OF [facility]  Discharge Instructions: Thank you for choosing [facility] to provide your oncology and hematology care.  If you have a lab appointment with the Cancer Center - please note that after April 8th, 2024, all labs will be drawn in the cancer center.  You do not have to check in or register with the main entrance as you have in the past but will complete your check-in in the cancer center.  Wear comfortable clothing and clothing appropriate for easy access to any Portacath or PICC line.   We strive to give you quality time with your provider. You may need to reschedule your appointment if you arrive late (15 or more minutes).  Arriving late affects you and other patients whose appointments are after yours.  Also, if you miss three or more appointments without notifying the office, you may be dismissed from the clinic at the provider's discretion.      For prescription refill requests, have your pharmacy contact our office and allow 72 hours for refills to be completed.    Today you received the following:  Feraheme.  Ferumoxytol Injection What is this medication? FERUMOXYTOL (FER ue MOX i tol) treats low levels of iron in your body (iron deficiency anemia). Iron is a mineral that plays an important role in making red blood cells, which carry oxygen from your lungs to the rest of your body. This medicine may be used for other purposes; ask your health care provider or pharmacist if you have questions. COMMON BRAND NAME(S): Feraheme What should I tell my care team before I take this medication? They need to know if you have any of these conditions: Anemia not caused by low iron levels High levels of iron in the blood Magnetic resonance imaging (MRI) test scheduled An unusual or allergic reaction to iron, other medications, foods, dyes, or preservatives Pregnant or trying to get pregnant Breastfeeding How should I  use this medication? This medication is injected into a vein. It is given by your care team in a hospital or clinic setting. Talk to your care team the use of this medication in children. Special care may be needed. Overdosage: If you think you have taken too much of this medicine contact a poison control center or emergency room at once. NOTE: This medicine is only for you. Do not share this medicine with others. What if I miss a dose? It is important not to miss your dose. Call your care team if you are unable to keep an appointment. What may interact with this medication? Other iron products This list may not describe all possible interactions. Give your health care provider a list of all the medicines, herbs, non-prescription drugs, or dietary supplements you use. Also tell them if you smoke, drink alcohol, or use illegal drugs. Some items may interact with your medicine. What should I watch for while using this medication? Visit your care team for regular checks on your progress. Tell your care team if your symptoms do not start to get better or if they get worse. You may need blood work done while you are taking this medication. You may need to eat more foods that contain iron. Talk to your care team. Foods that contain iron include whole grains or cereals, dried fruits, beans, peas, leafy green vegetables, and organ meats (liver, kidney). What side effects may I notice from receiving this medication? Side effects that you should  report to your care team as soon as possible: Allergic reactions--skin rash, itching, hives, swelling of the face, lips, tongue, or throat Low blood pressure--dizziness, feeling faint or lightheaded, blurry vision Shortness of breath Side effects that usually do not require medical attention (report to your care team if they continue or are bothersome): Flushing Headache Joint pain Muscle pain Nausea Pain, redness, or irritation at injection site This list  may not describe all possible side effects. Call your doctor for medical advice about side effects. You may report side effects to FDA at 1-800-FDA-1088. Where should I keep my medication? This medication is given in a hospital or clinic. It will not be stored at home. NOTE: This sheet is a summary. It may not cover all possible information. If you have questions about this medicine, talk to your doctor, pharmacist, or health care provider.  2024 Elsevier/Gold Standard (2023-05-04 00:00:00)    To help prevent nausea and vomiting after your treatment, we encourage you to take your nausea medication as directed.  BELOW ARE SYMPTOMS THAT SHOULD BE REPORTED IMMEDIATELY: *FEVER GREATER THAN 100.4 F (38 C) OR HIGHER *CHILLS OR SWEATING *NAUSEA AND VOMITING THAT IS NOT CONTROLLED WITH YOUR NAUSEA MEDICATION *UNUSUAL SHORTNESS OF BREATH *UNUSUAL BRUISING OR BLEEDING *URINARY PROBLEMS (pain or burning when urinating, or frequent urination) *BOWEL PROBLEMS (unusual diarrhea, constipation, pain near the anus) TENDERNESS IN MOUTH AND THROAT WITH OR WITHOUT PRESENCE OF ULCERS (sore throat, sores in mouth, or a toothache) UNUSUAL RASH, SWELLING OR PAIN  UNUSUAL VAGINAL DISCHARGE OR ITCHING   Items with * indicate a potential emergency and should be followed up as soon as possible or go to the Emergency Department if any problems should occur.  Please show the CHEMOTHERAPY ALERT CARD or IMMUNOTHERAPY ALERT CARD at check-in to the Emergency Department and triage nurse.  Should you have questions after your visit or need to cancel or reschedule your appointment, please contact [facility] [place] - A DEPT OF [facility] [phone]  and follow the prompts.  Office hours are 8:00 a.m. to 4:30 p.m. Monday - Friday. Please note that voicemails left after 4:00 p.m. may not be returned until the following business day.  We are closed weekends and major holidays. You have access to a  nurse at all times for urgent questions. Please call the main number to the clinic [phone] and follow the prompts.  For any non-urgent questions, you may also contact your provider using MyChart. We now offer e-Visits for anyone 39 and older to request care online for non-urgent symptoms. For details visit [email or web address].   Also download the MyChart app! Go to the app store, search "MyChart", open the app, select [place], and log in with your MyChart username and password.

## 2024-04-13 NOTE — Progress Notes (Signed)
 Patient presents today for iron infusion of Feraheme . Patient is in satisfactory condition with no new complaints voiced.  Vital signs are stable with exception of B/P 160/96, recheck 158/90-patient is aware to monitor B/P and follow up with PCP as needed.  This is patient's 4th iron infusion. No premeds required per treatment. We will proceed with infusion per provider orders.   Patient tolerated treatment well with no complaints voiced.  Patient left via wheelchair in stable condition.  Vital signs stable at discharge.  Follow up as scheduled.

## 2024-04-16 ENCOUNTER — Other Ambulatory Visit (HOSPITAL_COMMUNITY): Payer: Self-pay

## 2024-04-21 NOTE — Therapy (Addendum)
 OUTPATIENT PHYSICAL THERAPY LOWER EXTREMITY EVALUATION   Patient Name: Adam Long MRN: [id number] DOB:1976-04-18, 48 y.o., male Today's Date: 04/26/2024  END OF SESSION:  PT End of Session - 04/26/24 1429     Visit Number 1    Number of Visits 16    Date for PT Re-Evaluation 06/21/24    Authorization Type [place] [employer]    Authorization Time Period 27 PT/OT/ST please check auth    Authorization - Number of Visits 27    PT Start Time 1430    PT Stop Time 1510    PT Time Calculation (min) 40 min    Activity Tolerance Patient tolerated treatment well    Behavior During Therapy WFL for tasks assessed/performed          Past Medical History:  Diagnosis Date   Anxiety    Aortic stenosis    Cellulitis and abscess of lower extremity 06/11/2019   Cellulitis of fourth toe of left foot    Cholelithiasis    Coronary artery disease    Nonobstructive CAD (40-50% LAD) 08/2019   Depression    Diabetes mellitus without complication (HCC)    Phreesia 09/27/2020   Elevated troponin level not due myocardial infarction 11/11/2019   Essential hypertension    Gangrene of toe of left foot (HCC) 07/06/2019   Heart murmur    Phreesia 09/27/2020   Hyperlipidemia    Phreesia 09/27/2020   Hypertension    Phreesia 09/27/2020   Mixed hyperlipidemia    Morbid obesity (HCC)    S/P aortic valve replacement with mechanical valve 12/05/2019   25 mm Carbomedics top hat bileaflet mechanical valve via partial upper hemi-sternotomy   Severe aortic stenosis 09/24/2019   Type 2 diabetes mellitus (HCC)    Past Surgical History:  Procedure Laterality Date   ABDOMINAL AORTOGRAM W/LOWER EXTREMITY N/A 07/06/2019   Procedure: ABDOMINAL AORTOGRAM W/LOWER EXTREMITY;  Surgeon: Harvey Carlin BRAVO, MD;  Location: [facility] INVASIVE [facility];  Service: Cardiovascular;  Laterality: N/A;  Bilateral   AMPUTATION Left 07/09/2019   Procedure: LEFT FOURTH and Fifth TOE AMPUTATION.;  Surgeon: Oris Krystal FALCON, MD;   Location: [facility] OR;  Service: Vascular;  Laterality: Left;   AMPUTATION Left 03/11/2021   Procedure: LEFT FOOT 5TH  AND 4TH RAY AMPUTATION;  Surgeon: Harden Jerona GAILS, MD;  Location: [facility] OR;  Service: Orthopedics;  Laterality: Left;   AMPUTATION Left 08/28/2021   Procedure: AMPUTATION BELOW KNEE;  Surgeon: Harden Jerona GAILS, MD;  Location: [facility] OR;  Service: Orthopedics;  Laterality: Left;   AMPUTATION Left 01/29/2023   Procedure: LEFT AMPUTATION BELOW KNEE REVISION AND CLOSURE;  Surgeon: Harden Jerona GAILS, MD;  Location: [facility] OR;  Service: Orthopedics;  Laterality: Left;   AMPUTATION Left 07/27/2023   Procedure: LEFT ABOVE KNEE AMPUTATION;  Surgeon: Harden Jerona GAILS, MD;  Location: [facility] OR;  Service: Orthopedics;  Laterality: Left;   AORTIC VALVE REPLACEMENT N/A 12/05/2019   Procedure: PARTIAL STERNOTOMY FOR AORTIC VALVE REPLACEMENT (AVR), USING CARBOMEDICS SUPRA-ANNULAR TOP HAT ;  Surgeon: Dusty Sudie DEL, MD;  Location: [facility] OR;  Service: Open Heart Surgery;  Laterality: N/A;  No neck lines on left   APPLICATION OF WOUND VAC Left 10/30/2021   Procedure: APPLICATION OF WOUND VAC;  Surgeon: Harden Jerona GAILS, MD;  [facility] OR;  Service: Orthopedics;  Laterality: Left;   APPLICATION OF WOUND VAC Left 12/25/2021   Procedure: APPLICATION OF WOUND VAC;  Surgeon: Harden Jerona GAILS, MD;  Location: [facility] OR;  Service: Orthopedics;  Laterality: Left;   APPLICATION OF WOUND VAC Left 01/26/2023   Procedure: APPLICATION OF WOUND VAC TO LEFT STUMP;  Surgeon: Jerri Kay HERO, MD;  Location: [facility] OR;  Service: Orthopedics;  Laterality: Left;   CARDIAC VALVE REPLACEMENT N/A    Phreesia 09/27/2020   I & D EXTREMITY Left 01/26/2023   Procedure: IRRIGATION AND DEBRIDEMENT OF LEFT BKA STUMP;  Surgeon: Jerri Kay HERO, MD;  Location: [facility] OR;  Service: Orthopedics;  Laterality: Left;   IR RADIOLOGY PERIPHERAL GUIDED IV START  10/05/2019   IR US  GUIDE VASC ACCESS RIGHT  10/05/2019   MULTIPLE EXTRACTIONS WITH ALVEOLOPLASTY N/A 10/26/2019   Procedure:  EXTRACTION OF TOOTH #'S 3, 5-11,19-28,  AND 32 WITH ALVEOLOPLASTY;  Surgeon: Cyndee Tanda FALCON, DDS;  Location: [facility] OR;  Service: Oral Surgery;  Laterality: N/A;   RIGHT HEART CATH AND CORONARY ANGIOGRAPHY N/A 09/24/2019   Procedure: RIGHT HEART CATH AND CORONARY ANGIOGRAPHY;  Surgeon: Wonda Sharper, MD;  Location: [facility] INVASIVE CV LAB;  Service: Cardiovascular;  Laterality: N/A;   STUMP REVISION Left 10/30/2021   Procedure: REVISION LEFT BELOW KNEE AMPUTATION;  Surgeon: Harden Jerona GAILS, MD;  Location: [facility] OR;  Service: Orthopedics;  Laterality: Left;   STUMP REVISION Left 12/25/2021   Procedure: REVISION LEFT BELOW KNEE AMPUTATION;  Surgeon: Harden Jerona GAILS, MD;  Location: [facility] OR;  Service: Orthopedics;  Laterality: Left;   TEE WITHOUT CARDIOVERSION N/A 12/05/2019   Procedure: TRANSESOPHAGEAL ECHOCARDIOGRAM (TEE);  Surgeon: Dusty Sudie DEL, MD;  Location: [facility] OR;  Service: Open Heart Surgery;  Laterality: N/A;   TEE WITHOUT CARDIOVERSION N/A 09/01/2021   Procedure: TRANSESOPHAGEAL ECHOCARDIOGRAM (TEE);  Surgeon: Kate Lonni CROME, MD;  Location: [facility] ENDOSCOPY;  Service: Cardiovascular;  Laterality: N/A;   Patient Active Problem List   Diagnosis Date Noted   Vitamin B12 deficiency 02/15/2024   Iron deficiency anemia 12/22/2023   S/P AKA (above knee amputation), left (HCC) 07/31/2023   Hepatic steatosis 03/25/2023   GERD (gastroesophageal reflux disease) 01/24/2023   History of osteomyelitis 10/12/2022   Uncontrolled type 2 diabetes mellitus with hyperglycemia (HCC) 10/12/2022   CAD (coronary artery disease) 10/12/2022   Encounter for therapeutic drug monitoring 10/12/2022   Anxiety 09/30/2020   S/P aortic valve replacement with mechanical valve 12/05/2019   Hyperlipidemia associated with type 2 diabetes mellitus (HCC) 10/12/2015   Class 3 obesity 10/12/2015   HTN (hypertension) 07/02/2013    PCP: Bluford Jacqulyn MATSU, DO  REFERRING PROVIDER: Valdemar Rocky SAUNDERS, NP  REFERRING DIAG: [phone]  (ICD-10-CM) - S/P AKA (above knee amputation), left (HCC)  THERAPY DIAG:  S/P AKA (above knee amputation) unilateral, left (HCC)  Difficulty in walking, not elsewhere classified  Other abnormalities of gait and mobility  Rationale for Evaluation and Treatment: Rehabilitation  ONSET DATE: 07/27/23 s/p  SUBJECTIVE:   SUBJECTIVE STATEMENT: S/p 07/27/23 left AKA.  Received prosthesis last month. History of multiple surgeries on the left lower extremity.   Previously had a below knee amputation and managed well but this prosthesis is quite a bit more challenging.  Has been using a WC primarily for mobility and uses a walker to transfer (RW).  [employer] is prosthetist.  Arrives in [place] with family cousins who assist as needed.    PERTINENT HISTORY: Anemia; getting iron infusions Has artificial heart valve. PAIN:  Are you having pain? Yes: NPRS scale: 0 Pain location: phantom pain left leg; right knee sometimes Pain description: nerve pain, right knee aching Aggravating factors: right knee when being up on it  Relieving factors: Gabapentin , Tylenol   PRECAUTIONS: None  RED FLAGS: None   WEIGHT BEARING RESTRICTIONS: No  FALLS:  Has patient fallen in last 6 months? Yes. Number of falls 1  LIVING ENVIRONMENT: Lives with: lives with their family Lives in: House/apartment Stairs: No ramp entry Has following equipment at home: Vannie - 2 wheeled, Wheelchair (power), Wheelchair (manual), shower chair, bed side commode, and Ramped entry bed cane  OCCUPATION: not working; [employer] before  PLOF: Independent with household mobility with device  PATIENT GOALS: want to walk on 2 feet; being able to work some  NEXT MD VISIT: hematologist in August  OBJECTIVE:  Note: Objective measures were completed at Evaluation unless otherwise noted.  DIAGNOSTIC FINDINGS:   PATIENT SURVEYS:  LEFS 27/80 33.8% Extreme difficulty/unable (0), Quite a bit of difficulty (1), Moderate difficulty (2),  Little difficulty (3), No difficulty (4) Survey date:    Any of your usual work, housework or school activities   2. Usual hobbies, recreational or sporting activities   3. Getting into/out of the bath   4. Walking between rooms   5. Putting on socks/shoes   6. Squatting    7. Lifting an object, like a bag of groceries from the floor   8. Performing light activities around your home   9. Performing heavy activities around your home   10. Getting into/out of a car   11. Walking 2 blocks   12. Walking 1 mile   13. Going up/down 10 stairs (1 flight)   14. Standing for 1 hour   15.  sitting for 1 hour   16. Running on even ground   17. Running on uneven ground   18. Making sharp turns while running fast   19. Hopping    20. Rolling over in bed   Score total:  27/80     COGNITION: Overall cognitive status: Within functional limits for tasks assessed     SENSATION: Not tested  EDEMA:  None noted  POSTURE: rounded shoulders, forward head, flexed trunk , and weight shift right  PALPATION: No significant tenderness noted  LOWER EXTREMITY ROM:  Active ROM Right eval Left eval  Hip flexion    Hip extension    Hip abduction    Hip adduction    Hip internal rotation    Hip external rotation    Knee flexion    Knee extension    Ankle dorsiflexion    Ankle plantarflexion    Ankle inversion    Ankle eversion     (Blank rows = not tested)  LOWER EXTREMITY MMT:  MMT Right eval Left eval  Hip flexion    Hip extension    Hip abduction    Hip adduction    Hip internal rotation    Hip external rotation    Knee flexion    Knee extension    Ankle dorsiflexion    Ankle plantarflexion    Ankle inversion    Ankle eversion     (Blank rows = not tested)   FUNCTIONAL TESTS:  30 seconds chair stand test x 1 with min A for safety to RW  GAIT: Distance walked: 0 ft Assistive device utilized: Environmental consultant - 2 wheeled Level of assistance: Min A Comments: patient unable to  get left prosthesis to lock out to take a step  TREATMENT DATE: 04/26/24 physical therapy evaluation and HEP instruction    PATIENT EDUCATION:  Education details: Patient educated on exam findings, POC, scope of PT, HEP, and discussion regarding calling prosthetist for adjustments. Person educated: Patient Education method: Explanation, Demonstration, and Handouts Education comprehension: verbalized understanding, returned demonstration, verbal cues required, and tactile cues required   HOME EXERCISE PROGRAM: Access Code: [email or web address] URL: [email or web address] Date: 04/26/2024 Prepared by: AP - Rehab  Exercises - Standing Single Leg Stance with Counter Support  - 2 x daily - 7 x weekly - 1 sets - 2 reps - Lying Prone  - 2 x daily - 7 x weekly - 1 sets - 1 reps - 5 min hold - Sidelying Hip Abduction (AKA)  - 2 x daily - 7 x weekly - 2 sets - 10 reps - Prone Hip Extension with Residual Limb (BKA)  - 2 x daily - 7 x weekly - 2 sets - 10 reps  ASSESSMENT:  CLINICAL IMPRESSION: Patient is a 48 y.o. male who was seen today for physical therapy evaluation and treatment for Z89.612 (ICD-10-CM) - S/P AKA (above knee amputation), left (HCC). Patient demonstrates decreased strength, balance deficits and gait abnormalities which are negatively impacting patient ability to perform ADLs and functional mobility tasks. Patient will benefit from skilled physical therapy services to address these deficits to improve level of function with ADLs, functional mobility tasks, and reduce risk for falls.    OBJECTIVE IMPAIRMENTS: Abnormal gait, decreased activity tolerance, decreased balance, difficulty walking, decreased strength, obesity, and pain.   ACTIVITY LIMITATIONS: carrying, lifting, standing, squatting, stairs, transfers, bed mobility, and locomotion  level  PARTICIPATION LIMITATIONS: meal prep, cleaning, shopping, community activity, and yard work  PERSONAL FACTORS: Time since onset of injury/illness/exacerbation and 1 comorbidity: anemia are also affecting patient's functional outcome.   REHAB POTENTIAL: Good  CLINICAL DECISION MAKING: Evolving/moderate complexity  EVALUATION COMPLEXITY: Moderate   GOALS: Goals reviewed with patient? No  SHORT TERM GOALS: Target date: 05/24/2024 patient will be independent with initial HEP  Baseline: Goal status: INITIAL  2.  Patient will report 30% improvement overall  Baseline:  Goal status: INITIAL  3.  Patient will be able to don and doff prosthesis independently (patient lives alone) in order to increase ability to stand and walk household distances from bedroom to kitchen and bathroom independently. Baseline: needs mod assist Goal status: INITIAL  4. LONG TERM GOALS: Target date: 06/21/24  Patient will be independent in self management strategies to improve quality of life and functional outcomes.  Baseline:  Goal status: INITIAL  2.  Patient will report 50% improvement overall  Baseline:  Goal status: INITIAL  3.  Patient will improve LEFS score by 23 points to demonstrate improved perceived function with lower extremity activities including standing at the sink for grooming tasks, standing in the kitchen to prepare a light meal.    Baseline: 27/80 Goal status: INITIAL  4.  Patient will ambulate with LRAD and prosthesis x 200 ft in 2 minutes or less to improve efficiency with ambulation In home and short community distances for light shopping.   Baseline: unable Goal status: INITIAL  PLAN:  PT FREQUENCY: 2x/week  PT DURATION: 8 weeks  PLANNED INTERVENTIONS: 97164- PT Re-evaluation, 97110-Therapeutic exercises, 97530- Therapeutic activity, [id number]- Neuromuscular re-education, 97535- Self Care, 02859- Manual therapy, [id number]- Gait training, [phone]- Orthotic  Fit/training, [phone]- Canalith repositioning, [id number]- Aquatic Therapy, 97760- Splinting, [id number]- Wound care (first 20 sq cm), 02401- Wound care (each additional 20 sq cm)Patient/Family education, Balance  training, Stair training, Taping, Dry Needling, Joint mobilization, Joint manipulation, Spinal manipulation, Spinal mobilization, Scar mobilization, and DME instructions.   PLAN FOR NEXT SESSION: Review HEP and goals; prosthetic donning and doffing; pre-gait training; gait training when appropriate, balance, transfers, bed mobility; left lower extremity strengthening  3:36 PM, 04/26/24 Fawzi Melman Small Mackynzie Woolford MPT [facility] [place] [facility] [facility] [place] [phone] [phone]   Managed Medicaid Authorization Request Treatment Start Date: May 14, 2024  Visit Dx Codes: S10.387, R26.2, R26.89  Functional Tool Score: LEFS 27/80; 33.8%  For all possible CPT codes, reference the Planned Interventions line above.     Check all conditions that are expected to impact treatment: {Conditions expected to impact treatment:Morbid obesity   If treatment provided at initial evaluation, no treatment charged due to lack of authorization.

## 2024-04-26 ENCOUNTER — Ambulatory Visit (HOSPITAL_COMMUNITY): Attending: Family

## 2024-04-26 DIAGNOSIS — R2689 Other abnormalities of gait and mobility: Secondary | ICD-10-CM | POA: Insufficient documentation

## 2024-04-26 DIAGNOSIS — R262 Difficulty in walking, not elsewhere classified: Secondary | ICD-10-CM | POA: Diagnosis present

## 2024-04-26 DIAGNOSIS — Z89612 Acquired absence of left leg above knee: Secondary | ICD-10-CM | POA: Insufficient documentation

## 2024-04-27 NOTE — Progress Notes (Signed)
   04/27/24 1000  Spiritual Encounters  Type of Visit Follow up  Care provided to: Patient (Caregiver)  Referral source Chaplain assessment  Reason for visit Routine spiritual support  Spiritual Framework  Presenting Themes Caregiving needs;Rituals and practive;Coping tools  Community/Connection Family  Patient Stress Factors Not reviewed  Family Stress Factors Family relationships;Health changes;Exhausted  Interventions  Spiritual Care Interventions Made Compassionate presence;Reflective listening;Explored values/beliefs/practices/strengths;Prayer;Encouragement  Spiritual Care Plan  Spiritual Care Issues Still Outstanding Chaplain will continue to follow   Reason for Visit: Chaplain making scheduled follow-up with Pt I met 2 weeks ago and assessed the need for bi-weekly support.  Description of Visit: I reached out to the Pt via the telephone and received his voicemail.  I left message explaining that I was just checking in for support.  After hanging up, I called Kenny's caregiver, his cousin Crystal and checked in with her.  Crystal is sometimes overwhelmed by the responsibilities of caregiving she carries, she's a mother, wife, and caregiver to her cousin, Abby.  On top of that, the grief of losing her mother a year ago sits hard on Crystal as well.  I affirmed that her burdens are indeed heavy and that what Crystal does is not only seen, but appreciated.  I explored self-care with Crystal and encouraged her to schedule "appointments" for Crystal time.  I prayed for Crystal at the end of the conversation and she tearfully said "I needed that today, thank you."  Plan of Care: I will continue to follow both Abby and [employer] on a Bi-weekly basis.  Maude Roll, MDiv  Chaplain, [facility] [email or web address] [phone]

## 2024-05-02 ENCOUNTER — Encounter (HOSPITAL_COMMUNITY)

## 2024-05-04 ENCOUNTER — Encounter (HOSPITAL_COMMUNITY)

## 2024-05-04 ENCOUNTER — Encounter (HOSPITAL_COMMUNITY): Payer: Self-pay

## 2024-05-04 ENCOUNTER — Other Ambulatory Visit: Payer: Self-pay | Admitting: Family Medicine

## 2024-05-04 NOTE — Therapy (Signed)
 [facility] Outpatient Rehabilitation at [facility] [address] [place], [place], [address] Phone: [phone]   Fax:  [phone]  Patient Details  Name: Adam Long MRN: [id number] Date of Birth: [birth year] Referring Provider:  No ref. provider found  Encounter Date: 05/04/2024   Pt was called concerning his missed appointment this morning, no answer. Pt was left voicemail with reminder for next scheduled visit next week, we will hope to see him then.   Lang Ada, PT, DPT [facility] Office: [phone] 11:58 AM, 05/04/24   [facility] Outpatient Rehabilitation at [facility] [address] [place], [place], [address] Phone: [phone]   Fax:  [phone]

## 2024-05-05 ENCOUNTER — Other Ambulatory Visit: Payer: Self-pay

## 2024-05-07 ENCOUNTER — Other Ambulatory Visit: Payer: Self-pay

## 2024-05-07 ENCOUNTER — Other Ambulatory Visit (HOSPITAL_COMMUNITY): Payer: Self-pay

## 2024-05-07 MED ORDER — WARFARIN SODIUM 10 MG PO TABS
10.0000 mg | ORAL_TABLET | Freq: Every day | ORAL | 0 refills | Status: DC
  Filled 2024-05-07: qty 90, 90d supply, fill #0

## 2024-05-08 ENCOUNTER — Ambulatory Visit

## 2024-05-10 ENCOUNTER — Ambulatory Visit (HOSPITAL_COMMUNITY): Attending: Family

## 2024-05-10 ENCOUNTER — Encounter (HOSPITAL_COMMUNITY): Payer: Self-pay

## 2024-05-10 DIAGNOSIS — R748 Abnormal levels of other serum enzymes: Secondary | ICD-10-CM | POA: Diagnosis present

## 2024-05-10 DIAGNOSIS — Z01810 Encounter for preprocedural cardiovascular examination: Secondary | ICD-10-CM | POA: Insufficient documentation

## 2024-05-10 DIAGNOSIS — E785 Hyperlipidemia, unspecified: Secondary | ICD-10-CM | POA: Diagnosis present

## 2024-05-10 DIAGNOSIS — I10 Essential (primary) hypertension: Secondary | ICD-10-CM | POA: Diagnosis present

## 2024-05-10 DIAGNOSIS — R2681 Unsteadiness on feet: Secondary | ICD-10-CM | POA: Insufficient documentation

## 2024-05-10 DIAGNOSIS — Z952 Presence of prosthetic heart valve: Secondary | ICD-10-CM | POA: Insufficient documentation

## 2024-05-10 DIAGNOSIS — I4510 Unspecified right bundle-branch block: Secondary | ICD-10-CM | POA: Insufficient documentation

## 2024-05-10 DIAGNOSIS — R262 Difficulty in walking, not elsewhere classified: Secondary | ICD-10-CM | POA: Insufficient documentation

## 2024-05-10 DIAGNOSIS — Z89612 Acquired absence of left leg above knee: Secondary | ICD-10-CM | POA: Insufficient documentation

## 2024-05-10 DIAGNOSIS — R2689 Other abnormalities of gait and mobility: Secondary | ICD-10-CM | POA: Diagnosis present

## 2024-05-10 DIAGNOSIS — I2510 Atherosclerotic heart disease of native coronary artery without angina pectoris: Secondary | ICD-10-CM | POA: Diagnosis present

## 2024-05-10 NOTE — Therapy (Signed)
 OUTPATIENT PHYSICAL THERAPY LOWER EXTREMITY TREATMENT   Patient Name: Adam Long MRN: [id number] DOB:08-12-1976, 48 y.o., male Today's Date: 05/11/2024  END OF SESSION:  PT End of Session - 05/10/24 1300     Visit Number 2    Number of Visits 16    Date for PT Re-Evaluation 06/21/24    Authorization Type [place] [employer]    Authorization Time Period 27 PT/OT/ST please check auth    Authorization - Number of Visits 27    PT Start Time 1300    PT Stop Time 1340    PT Time Calculation (min) 40 min    Activity Tolerance Patient tolerated treatment well    Behavior During Therapy WFL for tasks assessed/performed          Past Medical History:  Diagnosis Date   Anxiety    Aortic stenosis    Cellulitis and abscess of lower extremity 06/11/2019   Cellulitis of fourth toe of left foot    Cholelithiasis    Coronary artery disease    Nonobstructive CAD (40-50% LAD) 08/2019   Depression    Diabetes mellitus without complication (HCC)    Phreesia 09/27/2020   Elevated troponin level not due myocardial infarction 11/11/2019   Essential hypertension    Gangrene of toe of left foot (HCC) 07/06/2019   Heart murmur    Phreesia 09/27/2020   Hyperlipidemia    Phreesia 09/27/2020   Hypertension    Phreesia 09/27/2020   Mixed hyperlipidemia    Morbid obesity (HCC)    S/P aortic valve replacement with mechanical valve 12/05/2019   25 mm Carbomedics top hat bileaflet mechanical valve via partial upper hemi-sternotomy   Severe aortic stenosis 09/24/2019   Type 2 diabetes mellitus (HCC)    Past Surgical History:  Procedure Laterality Date   ABDOMINAL AORTOGRAM W/LOWER EXTREMITY N/A 07/06/2019   Procedure: ABDOMINAL AORTOGRAM W/LOWER EXTREMITY;  Surgeon: Harvey Carlin BRAVO, MD;  Location: [facility];  Service: Cardiovascular;  Laterality: N/A;  Bilateral   AMPUTATION Left 07/09/2019   Procedure: LEFT FOURTH and Fifth TOE AMPUTATION.;  Surgeon: Oris Krystal FALCON, MD;   Location: [facility] OR;  Service: Vascular;  Laterality: Left;   AMPUTATION Left 03/11/2021   Procedure: LEFT FOOT 5TH  AND 4TH RAY AMPUTATION;  Surgeon: Harden Jerona GAILS, MD;  Location: [facility] OR;  Service: Orthopedics;  Laterality: Left;   AMPUTATION Left 08/28/2021   Procedure: AMPUTATION BELOW KNEE;  Surgeon: Harden Jerona GAILS, MD;  Location: [facility] OR;  Service: Orthopedics;  Laterality: Left;   AMPUTATION Left 01/29/2023   Procedure: LEFT AMPUTATION BELOW KNEE REVISION AND CLOSURE;  Surgeon: Harden Jerona GAILS, MD;  Location: [facility] OR;  Service: Orthopedics;  Laterality: Left;   AMPUTATION Left 07/27/2023   Procedure: LEFT ABOVE KNEE AMPUTATION;  Surgeon: Harden Jerona GAILS, MD;  Location: [facility] OR;  Service: Orthopedics;  Laterality: Left;   AORTIC VALVE REPLACEMENT N/A 12/05/2019   Procedure: PARTIAL STERNOTOMY FOR AORTIC VALVE REPLACEMENT (AVR), USING CARBOMEDICS SUPRA-ANNULAR TOP HAT ;  Surgeon: Dusty Sudie DEL, MD;  Location: [facility] OR;  Service: Open Heart Surgery;  Laterality: N/A;  No neck lines on left   APPLICATION OF WOUND VAC Left 10/30/2021   Procedure: APPLICATION OF WOUND VAC;  Surgeon: Harden Jerona GAILS, MD;  Location: [facility] OR;  Service: Orthopedics;  Laterality: Left;   APPLICATION OF WOUND VAC Left 12/25/2021   Procedure: APPLICATION OF WOUND VAC;  Surgeon: Harden Jerona GAILS, MD;  Location: [facility] OR;  Service: Orthopedics;  Laterality: Left;   APPLICATION OF WOUND VAC Left 01/26/2023   Procedure: APPLICATION OF WOUND VAC TO LEFT STUMP;  Surgeon: Jerri Kay HERO, MD;  Location: [facility] OR;  Service: Orthopedics;  Laterality: Left;   CARDIAC VALVE REPLACEMENT N/A    Phreesia 09/27/2020   I & D EXTREMITY Left 01/26/2023   Procedure: IRRIGATION AND DEBRIDEMENT OF LEFT BKA STUMP;  Surgeon: Jerri Kay HERO, MD;  Location: [facility] OR;  Service: Orthopedics;  Laterality: Left;   IR RADIOLOGY PERIPHERAL GUIDED IV START  10/05/2019   IR US  GUIDE VASC ACCESS RIGHT  10/05/2019   MULTIPLE EXTRACTIONS WITH ALVEOLOPLASTY N/A 10/26/2019   Procedure:  EXTRACTION OF TOOTH #'S 3, 5-11,19-28,  AND 32 WITH ALVEOLOPLASTY;  Surgeon: Cyndee Tanda FALCON, DDS;  Location: [facility] OR;  Service: Oral Surgery;  Laterality: N/A;   RIGHT HEART CATH AND CORONARY ANGIOGRAPHY N/A 09/24/2019   Procedure: RIGHT HEART CATH AND CORONARY ANGIOGRAPHY;  Surgeon: Wonda Sharper, MD;  Location: [facility] INVASIVE CV LAB;  Service: Cardiovascular;  Laterality: N/A;   STUMP REVISION Left 10/30/2021   Procedure: REVISION LEFT BELOW KNEE AMPUTATION;  Surgeon: Harden Jerona GAILS, MD;  Location: [facility] OR;  Service: Orthopedics;  Laterality: Left;   STUMP REVISION Left 12/25/2021   Procedure: REVISION LEFT BELOW KNEE AMPUTATION;  Surgeon: Harden Jerona GAILS, MD;  Location: [facility] OR;  Service: Orthopedics;  Laterality: Left;   TEE WITHOUT CARDIOVERSION N/A 12/05/2019   Procedure: TRANSESOPHAGEAL ECHOCARDIOGRAM (TEE);  Surgeon: Dusty Sudie DEL, MD;  Location: [facility] OR;  Service: Open Heart Surgery;  Laterality: N/A;   TEE WITHOUT CARDIOVERSION N/A 09/01/2021   Procedure: TRANSESOPHAGEAL ECHOCARDIOGRAM (TEE);  Surgeon: Kate Lonni CROME, MD;  Location: [facility] ENDOSCOPY;  Service: Cardiovascular;  Laterality: N/A;   Patient Active Problem List   Diagnosis Date Noted   Vitamin B12 deficiency 02/15/2024   Iron deficiency anemia 12/22/2023   S/P AKA (above knee amputation), left (HCC) 07/31/2023   Hepatic steatosis 03/25/2023   GERD (gastroesophageal reflux disease) 01/24/2023   History of osteomyelitis 10/12/2022   Uncontrolled type 2 diabetes mellitus with hyperglycemia (HCC) 10/12/2022   CAD (coronary artery disease) 10/12/2022   Encounter for therapeutic drug monitoring 10/12/2022   Anxiety 09/30/2020   S/P aortic valve replacement with mechanical valve 12/05/2019   Hyperlipidemia associated with type 2 diabetes mellitus (HCC) 10/12/2015   Class 3 obesity 10/12/2015   HTN (hypertension) 07/02/2013    PCP: Bluford Jacqulyn MATSU, DO  REFERRING PROVIDER: Valdemar Rocky SAUNDERS, NP  REFERRING DIAG: [phone]  (ICD-10-CM) - S/P AKA (above knee amputation), left (HCC)  THERAPY DIAG:  S/P AKA (above knee amputation) unilateral, left (HCC)  Difficulty in walking, not elsewhere classified  Other abnormalities of gait and mobility  Unsteadiness on feet  Rationale for Evaluation and Treatment: Rehabilitation  ONSET DATE: 07/27/23 s/p  SUBJECTIVE:   SUBJECTIVE STATEMENT: Pt states does not take many steps at home. Pt states partial compliance with HEP. Pt states he is still having difficulty donning the prosthetic and bearing weight through it.  S/p 07/27/23 left AKA.  Received prosthesis last month. History of multiple surgeries on the left lower extremity.   Previously had a below knee amputation and managed well but this prosthesis is quite a bit more challenging.  Has been using a WC primarily for mobility and uses a walker to transfer (RW).  [employer] is prosthetist.  Arrives in [facility] with family cousins who assist as needed.    PERTINENT HISTORY: Anemia; getting iron infusions Has artificial heart valve.  PAIN:  Are you having pain? Yes: NPRS scale: 0 Pain location: phantom pain left leg; right knee sometimes Pain description: nerve pain, right knee aching Aggravating factors: right knee when being up on it Relieving factors: Gabapentin , Tylenol   PRECAUTIONS: None  RED FLAGS: None   WEIGHT BEARING RESTRICTIONS: No  FALLS:  Has patient fallen in last 6 months? Yes. Number of falls 1  LIVING ENVIRONMENT: Lives with: lives with their family Lives in: House/apartment Stairs: No ramp entry Has following equipment at home: Vannie - 2 wheeled, Wheelchair (power), Wheelchair (manual), shower chair, bed side commode, and Ramped entry bed cane  OCCUPATION: not working; [employer] before  PLOF: Independent with household mobility with device  PATIENT GOALS: want to walk on 2 feet; being able to work some  NEXT MD VISIT: hematologist in August  OBJECTIVE:  Note: Objective measures  were completed at Evaluation unless otherwise noted.  DIAGNOSTIC FINDINGS:   PATIENT SURVEYS:  LEFS 27/80 33.8% Extreme difficulty/unable (0), Quite a bit of difficulty (1), Moderate difficulty (2), Little difficulty (3), No difficulty (4) Survey date:    Any of your usual work, housework or school activities   2. Usual hobbies, recreational or sporting activities   3. Getting into/out of the bath   4. Walking between rooms   5. Putting on socks/shoes   6. Squatting    7. Lifting an object, like a bag of groceries from the floor   8. Performing light activities around your home   9. Performing heavy activities around your home   10. Getting into/out of a car   11. Walking 2 blocks   12. Walking 1 mile   13. Going up/down 10 stairs (1 flight)   14. Standing for 1 hour   15.  sitting for 1 hour   16. Running on even ground   17. Running on uneven ground   18. Making sharp turns while running fast   19. Hopping    20. Rolling over in bed   Score total:  27/80     COGNITION: Overall cognitive status: Within functional limits for tasks assessed     SENSATION: Not tested  EDEMA:  None noted  POSTURE: rounded shoulders, forward head, flexed trunk , and weight shift right  PALPATION: No significant tenderness noted  LOWER EXTREMITY ROM:  Active ROM Right eval Left eval  Hip flexion    Hip extension    Hip abduction    Hip adduction    Hip internal rotation    Hip external rotation    Knee flexion    Knee extension    Ankle dorsiflexion    Ankle plantarflexion    Ankle inversion    Ankle eversion     (Blank rows = not tested)  LOWER EXTREMITY MMT:  MMT Right eval Left eval  Hip flexion    Hip extension    Hip abduction    Hip adduction    Hip internal rotation    Hip external rotation    Knee flexion    Knee extension    Ankle dorsiflexion    Ankle plantarflexion    Ankle inversion    Ankle eversion     (Blank rows = not tested)   FUNCTIONAL  TESTS:  30 seconds chair stand test x 1 with min A for safety to RW  GAIT: Distance walked: 0 ft Assistive device utilized: Environmental consultant - 2 wheeled Level of assistance: Min A Comments: patient unable to get left prosthesis to  lock out to take a step                                                                                                                                TREATMENT DATE:  05/10/2024  Neuromuscular Re-education: -Weight shifting on and off prosthetic leg, 2 sets of 9 reps, pt cued for increased left hip extension to maintain locking position of prosthetic and for upright posture, pt requires heavy dependence on RW for completion. Therapeutic Activity: -Sit to stands, 2 sets of 2 reps, pt cued for core activation and sequencing, pt requires heavy dependence on RW for completion as well as from elevated mat table. -Donning and doffing liner and prosthetic, x1, therapist and cousin assist to ensure best fit. -Wheel chair to mat table transfer x1, pt requires help taking arm off wheel chair, CGA for stand pivot transfer and cueing for sequencing -Mat table to wheelchair transfer, x1, pt require WC on right side to complete stand pivot transfer from elevated mat table.   04/26/24 physical therapy evaluation and HEP instruction    PATIENT EDUCATION:  Education details: Patient educated on exam findings, POC, scope of PT, HEP, and discussion regarding calling prosthetist for adjustments. Person educated: Patient Education method: Explanation, Demonstration, and Handouts Education comprehension: verbalized understanding, returned demonstration, verbal cues required, and tactile cues required   HOME EXERCISE PROGRAM: Access Code: [email or web address] URL: [email or web address] Date: 04/26/2024 Prepared by: [facility] - Rehab  Exercises - Standing Single Leg Stance with Counter Support  - 2 x daily - 7 x weekly - 1 sets - 2 reps - Lying Prone  - 2 x daily - 7 x weekly - 1 sets - 1  reps - 5 min hold - Sidelying Hip Abduction (AKA)  - 2 x daily - 7 x weekly - 2 sets - 10 reps - Prone Hip Extension with Residual Limb (BKA)  - 2 x daily - 7 x weekly - 2 sets - 10 reps  ASSESSMENT:  CLINICAL IMPRESSION: Patient continues to demonstrate difficulty donning/doffing prothesis, difficulty bearing weight through prothesis, decreased LE strength, decreased gait quality and balance. Patient also demonstrates decreased endurance with functional movement during today's session with multiple rest breaks throughout session. Patient able to initiate dynamic balance and core activation exercises today with weight shifting bouts and chair to table transfers, good performance with verbal cueing. Pt requires RW for STS transfer and is heavily dependent on RW for BUE support during weight shifting onto prothesis with flexed trunk and lack of left hip extension. Patient would continue to benefit from skilled physical therapy for increased independence with prosthesis, endurance with ambulation, increased LE strength, and improved balance for improved quality of life, improved independence with gait training and continued progress towards therapy goals.   Patient is a 48 y.o. male who was seen today for physical therapy evaluation and treatment for Z89.612 (ICD-10-CM) - S/P AKA (above knee amputation), left (HCC). Patient demonstrates decreased  strength, balance deficits and gait abnormalities which are negatively impacting patient ability to perform ADLs and functional mobility tasks. Patient will benefit from skilled physical therapy services to address these deficits to improve level of function with ADLs, functional mobility tasks, and reduce risk for falls.    OBJECTIVE IMPAIRMENTS: Abnormal gait, decreased activity tolerance, decreased balance, difficulty walking, decreased strength, obesity, and pain.   ACTIVITY LIMITATIONS: carrying, lifting, standing, squatting, stairs, transfers, bed  mobility, and locomotion level  PARTICIPATION LIMITATIONS: meal prep, cleaning, shopping, community activity, and yard work  PERSONAL FACTORS: Time since onset of injury/illness/exacerbation and 1 comorbidity: anemia are also affecting patient's functional outcome.   REHAB POTENTIAL: Good  CLINICAL DECISION MAKING: Evolving/moderate complexity  EVALUATION COMPLEXITY: Moderate   GOALS: Goals reviewed with patient? No  SHORT TERM GOALS: Target date: 05/24/2024 patient will be independent with initial HEP  Baseline: Goal status: INITIAL  2.  Patient will report 30% improvement overall  Baseline:  Goal status: INITIAL  3.  Patient will be able to don and doff prosthesis independently Baseline: needs mod assist Goal status: INITIAL  4. LONG TERM GOALS: Target date: 06/21/24  Patient will be independent in self management strategies to improve quality of life and functional outcomes.  Baseline:  Goal status: INITIAL  2.  Patient will report 50% improvement overall  Baseline:  Goal status: INITIAL  3.  Patient will improve LEFS score by 23 points to demonstrate improved perceived function  Baseline: 27/80 Goal status: INITIAL  4.  Patient will ambulate with LRAD and prosthesis x 200 ft in 2 minutes or less Baseline: unable Goal status: INITIAL  PLAN:  PT FREQUENCY: 2x/week  PT DURATION: 8 weeks  PLANNED INTERVENTIONS: 97164- PT Re-evaluation, 97110-Therapeutic exercises, 97530- Therapeutic activity, 97112- Neuromuscular re-education, 97535- Self Care, [id number]- Manual therapy, [id number]- Gait training, [phone]- Orthotic Fit/training, [phone]- Canalith repositioning, [id number]- Aquatic Therapy, 97760- Splinting, [phone]- Wound care (first 20 sq cm), 97598- Wound care (each additional 20 sq cm)Patient/Family education, Balance training, Stair training, Taping, Dry Needling, Joint mobilization, Joint manipulation, Spinal manipulation, Spinal mobilization, Scar mobilization, and DME  instructions.   PLAN FOR NEXT SESSION: Review HEP and goals; prosthetic donning and doffing; pre-gait training; gait training when appropriate, balance, transfers, bed mobility; left lower extremity strengthening, consider gravity minimized LE strengthening positions especially for left hip extension.  Lang Ada, PT, DPT [facility] Office: [phone] 7:50 AM, 05/11/24

## 2024-05-14 ENCOUNTER — Other Ambulatory Visit

## 2024-05-15 ENCOUNTER — Ambulatory Visit: Attending: Cardiology | Admitting: *Deleted

## 2024-05-15 ENCOUNTER — Encounter (HOSPITAL_COMMUNITY)

## 2024-05-15 DIAGNOSIS — Z954 Presence of other heart-valve replacement: Secondary | ICD-10-CM | POA: Diagnosis present

## 2024-05-15 DIAGNOSIS — Z5181 Encounter for therapeutic drug level monitoring: Secondary | ICD-10-CM | POA: Insufficient documentation

## 2024-05-15 LAB — POCT INR: INR: 2.3 (ref 2.0–3.0)

## 2024-05-15 NOTE — Progress Notes (Signed)
 INR-2.3; Please see anticoagulation encounter

## 2024-05-15 NOTE — Patient Instructions (Signed)
Continue warfarin 1 tablet daily expcet for 1/2 tablet on Monday, Wednesday and Friday. Recheck in 6 wks.

## 2024-05-17 ENCOUNTER — Encounter: Payer: Self-pay | Admitting: Nurse Practitioner

## 2024-05-17 ENCOUNTER — Encounter (HOSPITAL_COMMUNITY): Payer: Self-pay

## 2024-05-17 ENCOUNTER — Ambulatory Visit (INDEPENDENT_AMBULATORY_CARE_PROVIDER_SITE_OTHER): Admitting: Nurse Practitioner

## 2024-05-17 VITALS — BP 122/80 | HR 71 | Ht 68.0 in | Wt 340.0 lb

## 2024-05-17 DIAGNOSIS — I4510 Unspecified right bundle-branch block: Secondary | ICD-10-CM

## 2024-05-17 DIAGNOSIS — R2689 Other abnormalities of gait and mobility: Secondary | ICD-10-CM | POA: Diagnosis not present

## 2024-05-17 DIAGNOSIS — Z952 Presence of prosthetic heart valve: Secondary | ICD-10-CM

## 2024-05-17 DIAGNOSIS — I10 Essential (primary) hypertension: Secondary | ICD-10-CM | POA: Diagnosis not present

## 2024-05-17 DIAGNOSIS — I2510 Atherosclerotic heart disease of native coronary artery without angina pectoris: Secondary | ICD-10-CM

## 2024-05-17 DIAGNOSIS — E785 Hyperlipidemia, unspecified: Secondary | ICD-10-CM

## 2024-05-17 DIAGNOSIS — Z01810 Encounter for preprocedural cardiovascular examination: Secondary | ICD-10-CM

## 2024-05-17 DIAGNOSIS — Z89612 Acquired absence of left leg above knee: Secondary | ICD-10-CM | POA: Diagnosis not present

## 2024-05-17 DIAGNOSIS — R9431 Abnormal electrocardiogram [ECG] [EKG]: Secondary | ICD-10-CM

## 2024-05-17 DIAGNOSIS — R748 Abnormal levels of other serum enzymes: Secondary | ICD-10-CM

## 2024-05-17 NOTE — Patient Instructions (Addendum)
 Medication Instructions:  Your physician recommends that you continue on your current medications as directed. Please refer to the Current Medication list given to you today.   Labwork: None  Testing/Procedures: Your physician has requested that you have an echocardiogram. Echocardiography is a painless test that uses sound waves to create images of your heart. It provides your doctor with information about the size and shape of your heart and how well your heart's chambers and valves are working. This procedure takes approximately one hour. There are no restrictions for this procedure. Please do NOT wear cologne, perfume, aftershave, or lotions (deodorant is allowed). Please arrive 15 minutes prior to your appointment time.  Please note: We ask at that you not bring children with you during ultrasound (echo/ vascular) testing. Due to room size and safety concerns, children are not allowed in the ultrasound rooms during exams. Our front office staff cannot provide observation of children in our lobby area while testing is being conducted. An adult accompanying a patient to their appointment will only be allowed in the ultrasound room at the discretion of the ultrasound technician under special circumstances. We apologize for any inconvenience.   Follow-Up: Your physician recommends that you schedule a follow-up appointment in: 6 months  Any Other Special Instructions Will Be Listed Below (If Applicable).  If you need a refill on your cardiac medications before your next appointment, please call your pharmacy.

## 2024-05-17 NOTE — Therapy (Signed)
 [facility] Out[facility] Rehabilitation at [facility] [address] [place], [place], [address] Phone: [phone]   Fax:  [phone]  Patient Details  Name: Adam Long MRN: [id number] Date of Birth: 15-Sep-1976 Referring Provider:  No ref. provider found  Encounter Date: 05/17/2024   Pt was called concerning his missed doctors appointment earlier this week on Tuesday. Pt states he has a doctors appointment pop up and wan just not able to make it. Pt was reminded of his next appointment time and states he should be here then.  Lang Ada, PT, DPT [facility] Office: [phone] 9:28 AM, 05/17/24   [facility] Outpatient Rehabilitation at [facility] [address] [place], [place], [address] Phone: [phone]   Fax:  [phone]

## 2024-05-17 NOTE — Progress Notes (Signed)
 Cardiology Office Note   Date:  05/17/2024 ID:  Adam Long, Adam Long Aug 31, 1976, MRN [id number] PCP: Bluford Jacqulyn MATSU, DO  [facility] Providers Cardiologist:  Jayson Sierras, MD     History of Present Illness Adam Long is a 48 y.o. 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  Last seen by Olivia Pavy, PA-C on November 09, 2022.  At that time, he was pending clearance for colonoscopy.  Was overall doing well at the time.  Today he presents for overdue follow-up.  He states he is doing well. Tells me he is also pending colonoscopy and here for pre-op clearance. Denies any acute cardiac complaints or concerns at this time. Denies any chest pain, shortness of breath, palpitations, syncope, presyncope, dizziness, orthopnea, PND, swelling or significant weight changes, acute bleeding, or claudication.  ROS: Negative. See HPI.   Studies Reviewed  EKG: EKG Interpretation Date/Time:  Thursday May 17 2024 12:55:33 EDT Ventricular Rate:  71 PR Interval:  150 QRS Duration:  158 QT Interval:  452 QTC Calculation: 491 R Axis:   -83  Text Interpretation: Normal sinus rhythm Left axis deviation Right bundle branch block Minimal voltage criteria for LVH, may be normal variant ( R in aVL ) When compared with ECG of 06-Feb-2023 11:32, PREVIOUS ECG IS PRESENT Confirmed by Miriam Norris [phone]) on 05/17/2024 1:13:06 PM   Echo TEE 08/2021:  1. Left ventricular ejection fraction, by estimation, is 60 to 65%. The  left ventricle has normal function. The left ventricle has no regional  wall motion abnormalities.   2. Right ventricular systolic function is normal. The right ventricular  size is mildly enlarged.   3. No left atrial/left atrial appendage thrombus was detected.   4. The mitral valve is normal in structure.  Trivial mitral valve  regurgitation.   5. There is a 25 mm bileaflet valve present in the aortic position      Aortic valve regurgitation is not visualized. Echo findings are  consistent with normal structure and function of the aortic valve  prosthesis. Aortic valve mean gradient measures 13.0 mmHg.   Conclusion(s)/Recommendation(s): No vegetation seen. Pulmonic valve poorly  visualized due to shadowing from mechanical aortic valve. No clear  vegetation on mechanical aortic valve. Would monitor serial echocardiogram  while treating bacteremia if clinical   suspicion for endocarditis.   ABI's 01/2021:  Summary:  Right: Resting right ankle-brachial index indicates noncompressible right  lower extremity arteries. The right toe-brachial index is normal. ABIs are  unreliable. TBIs are unreliable.   Left: Resting left ankle-brachial index is within normal range. No  evidence of significant left lower extremity arterial disease. The left  toe-brachial index is normal. ABIs are unreliable. TBIs are unreliable.  Vascular ultrasound 11/2019:  Summary:  Right Carotid: The extracranial vessels were near-normal with only minimal  wall thickening or plaque.   Left Carotid: The extracranial vessels were near-normal with only minimal  wall thickening or plaque.  Vertebrals:  Bilateral vertebral arteries demonstrate antegrade flow.  Subclavians: Bilateral subclavian arteries were not visualized.   Right Upper Extremity: Doppler waveforms remain within normal limits with  right radial compression. Doppler waveforms remain within normal limits  with right ulnar compression.  Left Upper Extremity: Doppler waveforms remain within normal limits with  left radial compression. Doppler waveforms remain within normal limits  with left  ulnar compression.  CCTA 09/2019:  IMPRESSION: 1. Average quality study due to moderate signal- to-noise artifact due to severe obesity (BMI 55).   2. Bicuspid valve,  Sievers type 0 (non-raphe). Severely calcified with restricted leaflet motion.   3. Annular area 6.51 cm2. Appropriate for 29 mm Edwards Sapien 3 TAVR.   4. Sufficient coronary to annulus distance.   5. Optimum Fluoroscopic Angle for Delivery: LAO 6 CRA 11   6. No thrombus in the left atrial appendage.   7. 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   8. Severe 3-vessel calcifications noted.   Right heart cath and coronary angiography 08/2019:  1.  Calcified coronary arteries with moderate mid LAD stenosis, otherwise mild nonobstructive disease 2.  Known severe aortic stenosis by echo assessment with heavy calcification and restriction of the aortic valve leaflets as assessed by plain fluoroscopy 3.  Severe pulmonary hypertension with mean PA pressure 51 mmHg, at least in part to left heart disease with elevated wedge pressure of 31 mmHg   Recommendations: Ongoing multidisciplinary heart team evaluation for treatment of severe symptomatic aortic stenosis.  Medical therapy for nonobstructive CAD. Risk Assessment/Calculations         The 10-year ASCVD risk score (Arnett DK, et al., 2019) is: 5.6%   Values used to calculate the score:     Age: 59 years     Clincally relevant sex: Male     Is Non-Hispanic African American: No     Diabetic: Yes     Tobacco smoker: No     Systolic Blood Pressure: 122 mmHg     Is BP treated: Yes     HDL Cholesterol: 41 mg/dL     Total Cholesterol: 170 mg/dL  Physical Exam VS:  BP 122/80   Pulse 71   Ht 5' 8 (1.727 m)   Wt (!) 340 lb (154.2 kg)   SpO2 96%   BMI 51.70 kg/m   Wt Readings from Last 3 Encounters:  05/17/24 (!) 340 lb (154.2 kg)  03/26/24 (!) 340 lb (154.2 kg)  12/30/23 (!) 330 lb (149.7 kg)    GEN: Morbidly obese, 48 y.o. male in no acute distress NECK: No JVD; No carotid bruits CARDIAC: S1/S2, RRR, no murmurs, rubs, gallops RESPIRATORY:  Clear to auscultation without rales, wheezing or rhonchi  ABDOMEN: Soft, non-tender,  non-distended EXTREMITIES:  No edema; left AKA  ASSESSMENT AND PLAN  Pre-operative cardiovascular risk assessment Mr. Hari perioperative risk of a major cardiac event is 0.9% according to the Revised Cardiac Risk Index (RCRI).  Therefore, he is at low risk for perioperative complications.   His functional capacity is poor at 3.41 METs according to the Duke Activity Status Index (DASI). His activity is limited due to being wheelchair bound d/t hx of L above the knee amputation.  Recommendations: According to ACC/AHA guidelines, no further cardiovascular testing needed.  The patient may proceed to surgery at acceptable risk.   Antiplatelet and/or Anticoagulation Recommendations: Will CC clinical pharmacy to comment on holding time for Coumadin .   2. Non-obstructive CAD Stable with no anginal symptoms. No indication for ischemic evaluation. Not on ASA d/t being on Coumadin . No medication changes at this time. Heart healthy diet and regular cardiovascular exercise encouraged.   3. HTN BP is stable. Discussed to monitor BP at home at least 2 hours after medications and sitting for 5-10 minutes. Continue current medication regimen. Heart healthy diet and regular cardiovascular exercise encouraged.   4. HLD LDL 87 11/2023 with elevated  TG. Heart healthy diet recommended. Continue current medication regimen. Being managed by PCP.   5. Aortic valve stenosis, s/p TAVR, RBBB Doing well. Last TEE in 08/2021 showed findings consistent with normal structure and function of aortic valve prosthesis. EKG shows RBBB. Will update Echo at this time.   6. Morbid obesity Weight loss via diet and exercise encouraged. Discussed the impact being overweight would have on cardiovascular risk.  7. Elevated liver enzymes Mildly noted on labs from 01/2024. Recommended to f/u with PCP regarding this. Verbalized understanding.   I spent a total duration of 30 minutes reviewing prior notes, reviewing outside records  including  labs, EKG today, face-to-face counseling of  medical condition, pathophysiology, evaluation, management, and documenting the findings in the note.   Dispo: Follow-up with MD/APP in 6 months or sooner if anything changes.   Signed, Almarie Crate, NP

## 2024-05-18 ENCOUNTER — Inpatient Hospital Stay: Admitting: Oncology

## 2024-05-22 ENCOUNTER — Inpatient Hospital Stay: Attending: Hematology

## 2024-05-22 DIAGNOSIS — D509 Iron deficiency anemia, unspecified: Secondary | ICD-10-CM | POA: Diagnosis present

## 2024-05-22 DIAGNOSIS — E538 Deficiency of other specified B group vitamins: Secondary | ICD-10-CM | POA: Insufficient documentation

## 2024-05-22 DIAGNOSIS — D500 Iron deficiency anemia secondary to blood loss (chronic): Secondary | ICD-10-CM

## 2024-05-22 LAB — COMPREHENSIVE METABOLIC PANEL WITH GFR
ALT: 64 U/L — ABNORMAL HIGH (ref 0–44)
AST: 59 U/L — ABNORMAL HIGH (ref 15–41)
Albumin: 3.6 g/dL (ref 3.5–5.0)
Alkaline Phosphatase: 54 U/L (ref 38–126)
Anion gap: 14 (ref 5–15)
BUN: 11 mg/dL (ref 6–20)
CO2: 25 mmol/L (ref 22–32)
Calcium: 9.2 mg/dL (ref 8.9–10.3)
Chloride: 93 mmol/L — ABNORMAL LOW (ref 98–111)
Creatinine, Ser: 0.94 mg/dL (ref 0.61–1.24)
GFR, Estimated: >60 mL/min (ref >60)
Glucose, Bld: 377 mg/dL — ABNORMAL HIGH (ref 70–99)
Potassium: 4.4 mmol/L (ref 3.5–5.1)
Sodium: 132 mmol/L — ABNORMAL LOW (ref 135–145)
Total Bilirubin: 1.4 mg/dL — ABNORMAL HIGH (ref 0.0–1.2)
Total Protein: 7.5 g/dL (ref 6.5–8.1)

## 2024-05-22 LAB — CBC WITH DIFFERENTIAL/PLATELET
Abs Immature Granulocytes: 0.07 K/uL (ref 0.00–0.07)
Basophils Absolute: 0.1 K/uL (ref 0.0–0.1)
Basophils Relative: 1 %
Eosinophils Absolute: 0.1 K/uL (ref 0.0–0.5)
Eosinophils Relative: 1 %
HCT: 46.1 % (ref 39.0–52.0)
Hemoglobin: 15.2 g/dL (ref 13.0–17.0)
Immature Granulocytes: 1 %
Lymphocytes Relative: 14 %
Lymphs Abs: 1.2 K/uL (ref 0.7–4.0)
MCH: 27.8 pg (ref 26.0–34.0)
MCHC: 33 g/dL (ref 30.0–36.0)
MCV: 84.3 fL (ref 80.0–100.0)
Monocytes Absolute: 0.5 K/uL (ref 0.1–1.0)
Monocytes Relative: 6 %
Neutro Abs: 6.3 K/uL (ref 1.7–7.7)
Neutrophils Relative %: 77 %
Platelets: 185 K/uL (ref 150–400)
RBC: 5.47 MIL/uL (ref 4.22–5.81)
RDW: 16 % — ABNORMAL HIGH (ref 11.5–15.5)
WBC: 8.2 K/uL (ref 4.0–10.5)
nRBC: 0 % (ref 0.0–0.2)

## 2024-05-22 LAB — IRON AND TIBC
Iron: 72 ug/dL (ref 45–182)
Saturation Ratios: 19 % (ref 17.9–39.5)
TIBC: 374 ug/dL (ref 250–450)
UIBC: 302 ug/dL

## 2024-05-22 LAB — FOLATE: Folate: 13.1 ng/mL (ref >5.9)

## 2024-05-22 LAB — FERRITIN: Ferritin: 148 ng/mL (ref 24–336)

## 2024-05-22 LAB — VITAMIN B12: Vitamin B-12: 275 pg/mL (ref 180–914)

## 2024-05-23 ENCOUNTER — Ambulatory Visit (HOSPITAL_COMMUNITY): Admitting: Physical Therapy

## 2024-05-25 ENCOUNTER — Ambulatory Visit (HOSPITAL_COMMUNITY)

## 2024-05-25 ENCOUNTER — Telehealth (HOSPITAL_COMMUNITY): Payer: Self-pay

## 2024-05-25 NOTE — Telephone Encounter (Signed)
 No show #1; left message on patient voicemail regarding missed appointment today.  Reminder for upcoming appointment on 05/30/24.  11:39 AM, 05/25/24 Nathalie Cavendish Small Marquitta Persichetti MPT [facility] [place] [phone]

## 2024-05-29 ENCOUNTER — Other Ambulatory Visit (HOSPITAL_COMMUNITY): Payer: Self-pay

## 2024-05-29 ENCOUNTER — Inpatient Hospital Stay: Attending: Hematology | Admitting: Oncology

## 2024-05-29 VITALS — BP 157/67 | HR 78 | Temp 98.2°F | Resp 20

## 2024-05-29 DIAGNOSIS — D509 Iron deficiency anemia, unspecified: Secondary | ICD-10-CM | POA: Insufficient documentation

## 2024-05-29 DIAGNOSIS — E538 Deficiency of other specified B group vitamins: Secondary | ICD-10-CM | POA: Insufficient documentation

## 2024-05-29 DIAGNOSIS — R296 Repeated falls: Secondary | ICD-10-CM | POA: Insufficient documentation

## 2024-05-29 NOTE — Assessment & Plan Note (Addendum)
 Patient currently has iron deficiency anemia-unknown source.  Labs consistent with severe iron deficiency.   Patient is on metformin  and had previous borderline low vitamin B12 levels. Patient has received a total of 4 doses of IV iron most recently in June 2025.   Reports improvement of her symptoms and hemoglobin. Patient does not report any melena or hematochezia Patient is scheduled to get colonoscopy and endoscopy done after clearance from his cardiologist.  Patient was to have an echocardiogram completed first  -No additional IV iron needed at this time. - Continue oral iron every other day - Continue to follow with GI for colonoscopy and endoscopy -Return to clinic in 3 months with labs a few days before.

## 2024-05-29 NOTE — Assessment & Plan Note (Addendum)
 Patient has mild vitamin B12 deficiency with levels less than 400  - Continue oral vitamin B12 1000 mcg daily

## 2024-05-29 NOTE — Assessment & Plan Note (Deleted)
 Patient currently has iron deficiency anemia-unknown source.  Labs consistent with severe iron deficiency.   Patient is on metformin  and had previous borderline low vitamin B12 levels. Patient has received a total of 4 doses of IV iron most recently in June 2025.   Reports improvement of her symptoms and hemoglobin. Patient does not report any melena or hematochezia Patient is scheduled to get colonoscopy and endoscopy done after clearance from his cardiologist  -No additional IV iron needed at this time. - Continue oral iron every other day - Continue to follow with GI for colonoscopy and endoscopy Return to clinic in 3 months with labs a few days before.

## 2024-05-29 NOTE — Progress Notes (Signed)
 [facility] OFFICE PROGRESS NOTE  Cook, Jayce G, DO  ASSESSMENT & PLAN:    Assessment & Plan Iron deficiency anemia, unspecified iron deficiency anemia type Patient currently has iron deficiency anemia-unknown source.  Labs consistent with severe iron deficiency.   Patient is on metformin  and had previous borderline low vitamin B12 levels. Patient has received a total of 4 doses of IV iron most recently in June 2025.   Reports improvement of her symptoms and hemoglobin. Patient does not report any melena or hematochezia Patient is scheduled to get colonoscopy and endoscopy done after clearance from his cardiologist.  Patient was to have an echocardiogram completed first  -No additional IV iron needed at this time. - Continue oral iron every other day - Continue to follow with GI for colonoscopy and endoscopy -Return to clinic in 3 months with labs a few days before. Vitamin B12 deficiency Patient has mild vitamin B12 deficiency with levels less than 400  - Continue oral vitamin B12 1000 mcg daily   Orders Placed This Encounter  Procedures   Comprehensive metabolic panel with GFR    Standing Status:   Future    Expected Date:   09/03/2024    Expiration Date:   05/29/2025   CBC with Differential/Platelet    Standing Status:   Future    Expected Date:   09/03/2024    Expiration Date:   05/29/2025   Folate    Standing Status:   Future    Expected Date:   09/03/2024    Expiration Date:   05/29/2025   Iron and TIBC    Standing Status:   Future    Expected Date:   09/03/2024    Expiration Date:   05/29/2025   Vitamin B12    Standing Status:   Future    Expected Date:   09/03/2024    Expiration Date:   05/29/2025   Ferritin    Standing Status:   Future    Expected Date:   09/03/2024    Expiration Date:   05/29/2025    INTERVAL HISTORY: Patient returns for follow-up for iron deficiency and B12 deficiency.  She received 2 doses of Feraheme  on 04/05/2024 and 04/13/2024.  He  tolerates infusions well.  He has not had a colonoscopy or EGD yet.  Reports he must get cleared by her cardiologist first.  Reports they want to have an echocardiogram completed first prior to his colonoscopy.  Overall, patient is doing well.  Energy levels have improved.  Appetite is at 100%.  Denies any pain.  Reports occasional diarrhea and nausea but very rare.  He has headaches and numbness in his right foot.  Reports he has fallen twice without injury.  We reviewed CBC, iron panel,  ferritin, B12 and folate level.  SUMMARY OF HEMATOLOGIC HISTORY: Oncology History   No history exists.    Iron deficiency anemia-unknown source - S/p IV Feraheme  5 mg X 2 doses on 12/29/2020 and 01/09/2024 - S/p IV Feraheme  510 mg x 2 doses on 04/05/2024 and 04/13/2024.   CBC    Component Value Date/Time   WBC 8.2 05/22/2024 0938   RBC 5.47 05/22/2024 0938   HGB 15.2 05/22/2024 0938   HGB 13.1 04/27/2022 1033   HCT 46.1 05/22/2024 0938   HCT 41.3 04/27/2022 1033   PLT 185 05/22/2024 0938   PLT 257 04/27/2022 1033   MCV 84.3 05/22/2024 0938   MCV 79 04/27/2022 1033   MCH 27.8 05/22/2024 9061  MCHC 33.0 05/22/2024 0938   RDW 16.0 (H) 05/22/2024 0938   RDW 18.0 (H) 04/27/2022 1033   LYMPHSABS 1.2 05/22/2024 0938   LYMPHSABS 1.5 06/04/2021 0909   MONOABS 0.5 05/22/2024 0938   EOSABS 0.1 05/22/2024 0938   EOSABS 0.1 06/04/2021 0909   BASOSABS 0.1 05/22/2024 0938   BASOSABS 0.0 06/04/2021 0909       Latest Ref Rng & Units 05/22/2024    9:38 AM 01/31/2024   10:07 AM 09/23/2023    2:12 PM  CMP  Glucose 70 - 99 mg/dL 622  726  757   BUN 6 - 20 mg/dL 11  10  14    Creatinine 0.61 - 1.24 mg/dL 9.05  9.17  9.25   Sodium 135 - 145 mmol/L 132  132  135   Potassium 3.5 - 5.1 mmol/L 4.4  4.5  4.6   Chloride 98 - 111 mmol/L 93  94  100   CO2 22 - 32 mmol/L 25  24  26    Calcium  8.9 - 10.3 mg/dL 9.2  9.3  9.4   Total Protein 6.5 - 8.1 g/dL 7.5  7.3  6.8   Total Bilirubin 0.0 - 1.2 mg/dL 1.4  0.7  0.4    Alkaline Phos 38 - 126 U/L 54  38    AST 15 - 41 U/L 59  48  13   ALT 0 - 44 U/L 64  47  12      Lab Results  Component Value Date   FERRITIN 148 05/22/2024   VITAMINB12 275 05/22/2024    Vitals:   05/29/24 0929  BP: (!) 157/67  Pulse: 78  Resp: 20  Temp: 98.2 F (36.8 C)  SpO2: 100%    Review of System:  Review of Systems  Constitutional:  Positive for malaise/fatigue.  Gastrointestinal:  Positive for diarrhea and nausea.  Musculoskeletal:  Positive for falls.  Neurological:  Positive for dizziness.    Physical Exam: Physical Exam Constitutional:      Appearance: Normal appearance.  HENT:     Head: Normocephalic and atraumatic.  Eyes:     Pupils: Pupils are equal, round, and reactive to light.  Cardiovascular:     Rate and Rhythm: Normal rate and regular rhythm.     Heart sounds: Normal heart sounds. No murmur heard. Pulmonary:     Effort: Pulmonary effort is normal.     Breath sounds: Normal breath sounds. No wheezing.  Abdominal:     General: Bowel sounds are normal. There is no distension.     Palpations: Abdomen is soft.     Tenderness: There is no abdominal tenderness.  Musculoskeletal:        General: Normal range of motion.     Cervical back: Normal range of motion.  Skin:    General: Skin is warm and dry.     Findings: No rash.  Neurological:     Mental Status: He is alert and oriented to person, place, and time.     Gait: Gait is intact.  Psychiatric:        Mood and Affect: Mood and affect normal.        Cognition and Memory: Memory normal.        Judgment: Judgment normal.      I spent 20 minutes dedicated to the care of this patient (face-to-face and non-face-to-face) on the date of the encounter to include what is described in the assessment and plan.,  Delon Hope, NP 05/29/2024 9:49  AM

## 2024-05-30 ENCOUNTER — Ambulatory Visit (HOSPITAL_COMMUNITY)

## 2024-06-01 ENCOUNTER — Encounter (HOSPITAL_COMMUNITY): Payer: Self-pay

## 2024-06-01 ENCOUNTER — Ambulatory Visit (HOSPITAL_COMMUNITY): Attending: Family

## 2024-06-01 DIAGNOSIS — R2681 Unsteadiness on feet: Secondary | ICD-10-CM | POA: Diagnosis present

## 2024-06-01 DIAGNOSIS — M6281 Muscle weakness (generalized): Secondary | ICD-10-CM | POA: Insufficient documentation

## 2024-06-01 DIAGNOSIS — R2689 Other abnormalities of gait and mobility: Secondary | ICD-10-CM | POA: Insufficient documentation

## 2024-06-01 DIAGNOSIS — R262 Difficulty in walking, not elsewhere classified: Secondary | ICD-10-CM | POA: Diagnosis present

## 2024-06-01 DIAGNOSIS — Z89612 Acquired absence of left leg above knee: Secondary | ICD-10-CM | POA: Diagnosis present

## 2024-06-01 NOTE — Therapy (Signed)
 OUTPATIENT PHYSICAL THERAPY LOWER EXTREMITY TREATMENT   Patient Name: Adam Long MRN: [id number] DOB:1976-06-14, 48 y.o., male Today's Date: 06/01/2024  END OF SESSION:  PT End of Session - 06/01/24 1150     Visit Number 3    Number of Visits 16    Date for PT Re-Evaluation 06/21/24    Authorization Type [place] [employer]    Authorization Time Period 27 PT/OT/ST please check auth    Authorization - Number of Visits 27    PT Start Time 1150    PT Stop Time 1230    PT Time Calculation (min) 40 min    Activity Tolerance Patient tolerated treatment well    Behavior During Therapy WFL for tasks assessed/performed           Past Medical History:  Diagnosis Date   Anxiety    Aortic stenosis    Cellulitis and abscess of lower extremity 06/11/2019   Cellulitis of fourth toe of left foot    Cholelithiasis    Coronary artery disease    Nonobstructive CAD (40-50% LAD) 08/2019   Depression    Diabetes mellitus without complication (HCC)    Phreesia 09/27/2020   Elevated troponin level not due myocardial infarction 11/11/2019   Essential hypertension    Gangrene of toe of left foot (HCC) 07/06/2019   Heart murmur    Phreesia 09/27/2020   Hyperlipidemia    Phreesia 09/27/2020   Hypertension    Phreesia 09/27/2020   Mixed hyperlipidemia    Morbid obesity (HCC)    S/P aortic valve replacement with mechanical valve 12/05/2019   25 mm Carbomedics top hat bileaflet mechanical valve via partial upper hemi-sternotomy   Severe aortic stenosis 09/24/2019   Type 2 diabetes mellitus (HCC)    Past Surgical History:  Procedure Laterality Date   ABDOMINAL AORTOGRAM W/LOWER EXTREMITY N/A 07/06/2019   Procedure: ABDOMINAL AORTOGRAM W/LOWER EXTREMITY;  Surgeon: Harvey Carlin BRAVO, MD;  Location: [facility] INVASIVE CV LAB;  Service: Cardiovascular;  Laterality: N/A;  Bilateral   AMPUTATION Left 07/09/2019   Procedure: LEFT FOURTH and Fifth TOE AMPUTATION.;  Surgeon: Oris Krystal FALCON, MD;   Location: [facility] OR;  Service: Vascular;  Laterality: Left;   AMPUTATION Left 03/11/2021   Procedure: LEFT FOOT 5TH  AND 4TH RAY AMPUTATION;  Surgeon: Harden Jerona GAILS, MD;  Location: [facility] OR;  Service: Orthopedics;  Laterality: Left;   AMPUTATION Left 08/28/2021   Procedure: AMPUTATION BELOW KNEE;  Surgeon: Harden Jerona GAILS, MD;  Location: [facility] OR;  Service: Orthopedics;  Laterality: Left;   AMPUTATION Left 01/29/2023   Procedure: LEFT AMPUTATION BELOW KNEE REVISION AND CLOSURE;  Surgeon: Harden Jerona GAILS, MD;  Location: [facility] OR;  Service: Orthopedics;  Laterality: Left;   AMPUTATION Left 07/27/2023   Procedure: LEFT ABOVE KNEE AMPUTATION;  Surgeon: Harden Jerona GAILS, MD;  Location: [facility] OR;  Service: Orthopedics;  Laterality: Left;   AORTIC VALVE REPLACEMENT N/A 12/05/2019   Procedure: PARTIAL STERNOTOMY FOR AORTIC VALVE REPLACEMENT (AVR), USING CARBOMEDICS SUPRA-ANNULAR TOP HAT ;  Surgeon: Dusty Sudie DEL, MD;  Location: [facility] OR;  Service: Open Heart Surgery;  Laterality: N/A;  No neck lines on left   APPLICATION OF WOUND VAC Left 10/30/2021   Procedure: APPLICATION OF WOUND VAC;  Surgeon: Harden Jerona GAILS, MD;  Location: [facility] OR;  Service: Orthopedics;  Laterality: Left;   APPLICATION OF WOUND VAC Left 12/25/2021   Procedure: APPLICATION OF WOUND VAC;  Surgeon: Harden Jerona GAILS, MD;  Location: [facility] OR;  Service:  Orthopedics;  Laterality: Left;   APPLICATION OF WOUND VAC Left 01/26/2023   Procedure: APPLICATION OF WOUND VAC TO LEFT STUMP;  Surgeon: Jerri Kay HERO, MD;  Location: [facility] OR;  Service: Orthopedics;  Laterality: Left;   CARDIAC VALVE REPLACEMENT N/A    Phreesia 09/27/2020   I & D EXTREMITY Left 01/26/2023   Procedure: IRRIGATION AND DEBRIDEMENT OF LEFT BKA STUMP;  Surgeon: Jerri Kay HERO, MD;  Location: [facility] OR;  Service: Orthopedics;  Laterality: Left;   IR RADIOLOGY PERIPHERAL GUIDED IV START  10/05/2019   IR US  GUIDE VASC ACCESS RIGHT  10/05/2019   MULTIPLE EXTRACTIONS WITH ALVEOLOPLASTY N/A 10/26/2019   Procedure:  EXTRACTION OF TOOTH #'S 3, 5-11,19-28,  AND 32 WITH ALVEOLOPLASTY;  Surgeon: Cyndee Tanda FALCON, DDS;  Location: [facility] OR;  Service: Oral Surgery;  Laterality: N/A;   RIGHT HEART CATH AND CORONARY ANGIOGRAPHY N/A 09/24/2019   Procedure: RIGHT HEART CATH AND CORONARY ANGIOGRAPHY;  Surgeon: Wonda Sharper, MD;  Location: [facility] INVASIVE CV LAB;  Service: Cardiovascular;  Laterality: N/A;   STUMP REVISION Left 10/30/2021   Procedure: REVISION LEFT BELOW KNEE AMPUTATION;  Surgeon: Harden Jerona GAILS, MD;  Location: [facility] OR;  Service: Orthopedics;  Laterality: Left;   STUMP REVISION Left 12/25/2021   Procedure: REVISION LEFT BELOW KNEE AMPUTATION;  Surgeon: Harden Jerona GAILS, MD;  Location: [facility] OR;  Service: Orthopedics;  Laterality: Left;   TEE WITHOUT CARDIOVERSION N/A 12/05/2019   Procedure: TRANSESOPHAGEAL ECHOCARDIOGRAM (TEE);  Surgeon: Dusty Sudie DEL, MD;  Location: [facility] OR;  Service: Open Heart Surgery;  Laterality: N/A;   TEE WITHOUT CARDIOVERSION N/A 09/01/2021   Procedure: TRANSESOPHAGEAL ECHOCARDIOGRAM (TEE);  Surgeon: Kate Lonni CROME, MD;  Location: [facility] ENDOSCOPY;  Service: Cardiovascular;  Laterality: N/A;   Patient Active Problem List   Diagnosis Date Noted   Vitamin B12 deficiency 02/15/2024   Iron deficiency anemia 12/22/2023   S/P AKA (above knee amputation), left (HCC) 07/31/2023   Hepatic steatosis 03/25/2023   GERD (gastroesophageal reflux disease) 01/24/2023   History of osteomyelitis 10/12/2022   Uncontrolled type 2 diabetes mellitus with hyperglycemia (HCC) 10/12/2022   CAD (coronary artery disease) 10/12/2022   Encounter for therapeutic drug monitoring 10/12/2022   Anxiety 09/30/2020   S/P aortic valve replacement with mechanical valve 12/05/2019   Hyperlipidemia associated with type 2 diabetes mellitus (HCC) 10/12/2015   Class 3 obesity 10/12/2015   HTN (hypertension) 07/02/2013    PCP: Bluford Jacqulyn MATSU, DO  REFERRING PROVIDER: Valdemar Rocky SAUNDERS, NP  REFERRING DIAG: [phone]  (ICD-10-CM) - S/P AKA (above knee amputation), left (HCC)  THERAPY DIAG:  S/P AKA (above knee amputation) unilateral, left (HCC)  Difficulty in walking, not elsewhere classified  Other abnormalities of gait and mobility  Unsteadiness on feet  Muscle weakness (generalized)  Rationale for Evaluation and Treatment: Rehabilitation  ONSET DATE: 07/27/23 s/p  SUBJECTIVE:   SUBJECTIVE STATEMENT: Pt states he can tell that his LLE is weak. Pt states he is still having difficulty donning the prosthetic and bearing weight through it.  S/p 07/27/23 left AKA.  Received prosthesis last month. History of multiple surgeries on the left lower extremity.   Previously had a below knee amputation and managed well but this prosthesis is quite a bit more challenging.  Has been using a WC primarily for mobility and uses a walker to transfer (RW).  [employer] is prosthetist.  Arrives in [facility] with family cousins who assist as needed.    PERTINENT HISTORY: Anemia; getting iron infusions Has artificial heart  valve. PAIN:  Are you having pain? Yes: NPRS scale: 0 Pain location: phantom pain left leg; right knee sometimes Pain description: nerve pain, right knee aching Aggravating factors: right knee when being up on it Relieving factors: Gabapentin , Tylenol   PRECAUTIONS: None  RED FLAGS: None   WEIGHT BEARING RESTRICTIONS: No  FALLS:  Has patient fallen in last 6 months? Yes. Number of falls 1  LIVING ENVIRONMENT: Lives with: lives with their family Lives in: House/apartment Stairs: No ramp entry Has following equipment at home: Vannie - 2 wheeled, Wheelchair (power), Wheelchair (manual), shower chair, bed side commode, and Ramped entry bed cane  OCCUPATION: not working; [employer] before  PLOF: Independent with household mobility with device  PATIENT GOALS: want to walk on 2 feet; being able to work some  NEXT MD VISIT: hematologist in August  OBJECTIVE:  Note: Objective measures were  completed at Evaluation unless otherwise noted.  DIAGNOSTIC FINDINGS:   PATIENT SURVEYS:  LEFS 27/80 33.8% Extreme difficulty/unable (0), Quite a bit of difficulty (1), Moderate difficulty (2), Little difficulty (3), No difficulty (4) Survey date:    Any of your usual work, housework or school activities   2. Usual hobbies, recreational or sporting activities   3. Getting into/out of the bath   4. Walking between rooms   5. Putting on socks/shoes   6. Squatting    7. Lifting an object, like a bag of groceries from the floor   8. Performing light activities around your home   9. Performing heavy activities around your home   10. Getting into/out of a car   11. Walking 2 blocks   12. Walking 1 mile   13. Going up/down 10 stairs (1 flight)   14. Standing for 1 hour   15.  sitting for 1 hour   16. Running on even ground   17. Running on uneven ground   18. Making sharp turns while running fast   19. Hopping    20. Rolling over in bed   Score total:  27/80     COGNITION: Overall cognitive status: Within functional limits for tasks assessed     SENSATION: Not tested  EDEMA:  None noted  POSTURE: rounded shoulders, forward head, flexed trunk , and weight shift right  PALPATION: No significant tenderness noted  LOWER EXTREMITY ROM:  Active ROM Right eval Left eval  Hip flexion    Hip extension    Hip abduction    Hip adduction    Hip internal rotation    Hip external rotation    Knee flexion    Knee extension    Ankle dorsiflexion    Ankle plantarflexion    Ankle inversion    Ankle eversion     (Blank rows = not tested)  LOWER EXTREMITY MMT:  MMT Right eval Left eval  Hip flexion    Hip extension    Hip abduction    Hip adduction    Hip internal rotation    Hip external rotation    Knee flexion    Knee extension    Ankle dorsiflexion    Ankle plantarflexion    Ankle inversion    Ankle eversion     (Blank rows = not tested)   FUNCTIONAL  TESTS:  30 seconds chair stand test x 1 with min A for safety to RW  GAIT: Distance walked: 0 ft Assistive device utilized: Environmental consultant - 2 wheeled Level of assistance: Min A Comments: patient unable to get left prosthesis  to lock out to take a step                                                                                                                                TREATMENT DATE:  06/01/2024  Neuromuscular Re-education: -Weight shifting on and off prosthetic leg, 2 sets of 3 reps, pt cued for increased left hip extension to maintain locking position of prosthetic and for upright posture, pt requires heavy dependence on RW for completion. -Practice extending LLE hip for independent locking of knee, tactile cues required 2 sets of 5 reps Therapeutic Activity: -SLR, 1 set of 8 bilateral for training lifting leg into car, pt cued for increased ROM -Side lying hip abductions, 1 set of 7 reps, LLE only with prosthetic donned for training lifting LLE into car, pt requires cueing for hand support if prosthesis slides down stub -Donning and doffing liner and prosthetic, x1, therapist and cousin assist to ensure best fit. -Wheel chair to mat table transfer x1, pt requires help taking arm off wheel chair, CGA for stand pivot transfer and cueing for sequencing -Mat table to wheelchair transfer, x1, pt require WC on right side to complete stand pivot transfer from elevated mat table. Gait Training: -Ambulation trials with RW and unlocked prosthetic, 3 bouts of 3 steps fwd and 3 steps bwd -Ambulation trial with RW with locked prosthetic, one bout of 8 feet to [facility], pt requires mod therapist assist with ambulation  05/10/2024  Neuromuscular Re-education: -Weight shifting on and off prosthetic leg, 2 sets of 9 reps, pt cued for increased left hip extension to maintain locking position of prosthetic and for upright posture, pt requires heavy dependence on RW for completion. Therapeutic Activity: -Sit to  stands, 2 sets of 2 reps, pt cued for core activation and sequencing, pt requires heavy dependence on RW for completion as well as from elevated mat table. -Donning and doffing liner and prosthetic, x1, therapist and cousin assist to ensure best fit. -Wheel chair to mat table transfer x1, pt requires help taking arm off wheel chair, CGA for stand pivot transfer and cueing for sequencing -Mat table to wheelchair transfer, x1, pt require WC on right side to complete stand pivot transfer from elevated mat table.   04/26/24 physical therapy evaluation and HEP instruction    PATIENT EDUCATION:  Education details: Patient educated on exam findings, POC, scope of PT, HEP, and discussion regarding calling prosthetist for adjustments. Person educated: Patient Education method: Explanation, Demonstration, and Handouts Education comprehension: verbalized understanding, returned demonstration, verbal cues required, and tactile cues required   HOME EXERCISE PROGRAM: Access Code: [email or web address] URL: [email or web address] Date: 04/26/2024 Prepared by: [facility] - Rehab  Exercises - Standing Single Leg Stance with Counter Support  - 2 x daily - 7 x weekly - 1 sets - 2 reps - Lying Prone  - 2 x daily - 7 x weekly - 1 sets - 1 reps - 5 min hold -  Sidelying Hip Abduction (AKA)  - 2 x daily - 7 x weekly - 2 sets - 10 reps - Prone Hip Extension with Residual Limb (BKA)  - 2 x daily - 7 x weekly - 2 sets - 10 reps  ASSESSMENT:  CLINICAL IMPRESSION: Patient continues to demonstrate difficulty donning/doffing prothesis, difficulty bearing weight through prothesis, progress to WB with Knee locked, decreased LE/core strength, decreased gait quality and balance. Patient did demonstrate ability to take first steps with prosthesis today with moderate assist from therapist. Patient able to progress dynamic balance and core activation exercises today with bed mobility and ambulation bout and practice standing with  no UE support on RW.  Pt requires education to bring walker from home for more proper size RW and ambulation in clinic and importance of HEP compliance and donning prosthesis everyday. Patient would continue to benefit from skilled physical therapy for increased independence with prosthesis, endurance with ambulation, increased LE strength, and improved balance for improved quality of life, improved independence with gait training and continued progress towards therapy goals.   Patient is a 48 y.o. male who was seen today for physical therapy evaluation and treatment for Z89.612 (ICD-10-CM) - S/P AKA (above knee amputation), left (HCC). Patient demonstrates decreased strength, balance deficits and gait abnormalities which are negatively impacting patient ability to perform ADLs and functional mobility tasks. Patient will benefit from skilled physical therapy services to address these deficits to improve level of function with ADLs, functional mobility tasks, and reduce risk for falls.    OBJECTIVE IMPAIRMENTS: Abnormal gait, decreased activity tolerance, decreased balance, difficulty walking, decreased strength, obesity, and pain.   ACTIVITY LIMITATIONS: carrying, lifting, standing, squatting, stairs, transfers, bed mobility, and locomotion level  PARTICIPATION LIMITATIONS: meal prep, cleaning, shopping, community activity, and yard work  PERSONAL FACTORS: Time since onset of injury/illness/exacerbation and 1 comorbidity: anemia are also affecting patient's functional outcome.   REHAB POTENTIAL: Good  CLINICAL DECISION MAKING: Evolving/moderate complexity  EVALUATION COMPLEXITY: Moderate   GOALS: Goals reviewed with patient? No  SHORT TERM GOALS: Target date: 05/24/2024 patient will be independent with initial HEP  Baseline: Goal status: INITIAL  2.  Patient will report 30% improvement overall  Baseline:  Goal status: INITIAL  3.  Patient will be able to don and doff prosthesis  independently Baseline: needs mod assist Goal status: INITIAL  4. LONG TERM GOALS: Target date: 06/21/24  Patient will be independent in self management strategies to improve quality of life and functional outcomes.  Baseline:  Goal status: INITIAL  2.  Patient will report 50% improvement overall  Baseline:  Goal status: INITIAL  3.  Patient will improve LEFS score by 23 points to demonstrate improved perceived function  Baseline: 27/80 Goal status: INITIAL  4.  Patient will ambulate with LRAD and prosthesis x 200 ft in 2 minutes or less Baseline: unable Goal status: INITIAL  PLAN:  PT FREQUENCY: 2x/week  PT DURATION: 8 weeks  PLANNED INTERVENTIONS: 97164- PT Re-evaluation, 97110-Therapeutic exercises, 97530- Therapeutic activity, 97112- Neuromuscular re-education, 97535- Self Care, [id number]- Manual therapy, [id number]- Gait training, [phone]- Orthotic Fit/training, [phone]- Canalith repositioning, [id number]- Aquatic Therapy, 97760- Splinting, [phone]- Wound care (first 20 sq cm), 97598- Wound care (each additional 20 sq cm)Patient/Family education, Balance training, Stair training, Taping, Dry Needling, Joint mobilization, Joint manipulation, Spinal manipulation, Spinal mobilization, Scar mobilization, and DME instructions.   PLAN FOR NEXT SESSION: Give printout of HEP; prosthetic donning and doffing; pre-gait training; gait training when appropriate, balance, transfers, bed mobility; left lower  extremity strengthening, consider gravity minimized LE strengthening positions especially for left hip extension.  Lang Ada, PT, DPT [facility] Office: [phone] 5:06 PM, 06/01/24

## 2024-06-04 ENCOUNTER — Encounter (HOSPITAL_COMMUNITY): Payer: Self-pay

## 2024-06-04 ENCOUNTER — Ambulatory Visit (HOSPITAL_COMMUNITY)

## 2024-06-04 DIAGNOSIS — R2681 Unsteadiness on feet: Secondary | ICD-10-CM

## 2024-06-04 DIAGNOSIS — R2689 Other abnormalities of gait and mobility: Secondary | ICD-10-CM

## 2024-06-04 DIAGNOSIS — Z89612 Acquired absence of left leg above knee: Secondary | ICD-10-CM

## 2024-06-04 DIAGNOSIS — M6281 Muscle weakness (generalized): Secondary | ICD-10-CM

## 2024-06-04 DIAGNOSIS — R262 Difficulty in walking, not elsewhere classified: Secondary | ICD-10-CM

## 2024-06-04 NOTE — Therapy (Signed)
 OUTPATIENT PHYSICAL THERAPY LOWER EXTREMITY TREATMENT   Patient Name: DENYM RAHIMI MRN: [id number] DOB:[birth year], 48 y.o., male Today's Date: 06/04/2024  END OF SESSION:  PT End of Session - 06/04/24 0937     Visit Number 4    Number of Visits 16    Date for PT Re-Evaluation 06/21/24    Authorization Type [place] Medicaid [employer]    Authorization Time Period 27 PT/OT/ST please check auth    Authorization - Visit Number 3    Authorization - Number of Visits 27    Progress Note Due on Visit 10    PT Start Time [phone]    PT Stop Time 1015    PT Time Calculation (min) 38 min    Activity Tolerance Patient tolerated treatment well    Behavior During Therapy [facility] for tasks assessed/performed            Past Medical History:  Diagnosis Date   Anxiety    Aortic stenosis    Cellulitis and abscess of lower extremity 06/11/2019   Cellulitis of fourth toe of left foot    Cholelithiasis    Coronary artery disease    Nonobstructive CAD (40-50% LAD) 08/2019   Depression    Diabetes mellitus without complication (HCC)    Phreesia 09/27/2020   Elevated troponin level not due myocardial infarction 11/11/2019   Essential hypertension    Gangrene of toe of left foot (HCC) 07/06/2019   Heart murmur    Phreesia 09/27/2020   Hyperlipidemia    Phreesia 09/27/2020   Hypertension    Phreesia 09/27/2020   Mixed hyperlipidemia    Morbid obesity (HCC)    S/P aortic valve replacement with mechanical valve 12/05/2019   25 mm Carbomedics top hat bileaflet mechanical valve via partial upper hemi-sternotomy   Severe aortic stenosis 09/24/2019   Type 2 diabetes mellitus (HCC)    Past Surgical History:  Procedure Laterality Date   ABDOMINAL AORTOGRAM W/LOWER EXTREMITY N/A 07/06/2019   Procedure: ABDOMINAL AORTOGRAM W/LOWER EXTREMITY;  Surgeon: Harvey Carlin BRAVO, MD;  Location: [facility] INVASIVE CV LAB;  Service: Cardiovascular;  Laterality: N/A;  Bilateral   AMPUTATION Left 07/09/2019   Procedure:  LEFT FOURTH and Fifth TOE AMPUTATION.;  Surgeon: Oris Krystal FALCON, MD;  Location: [facility] OR;  Service: Vascular;  Laterality: Left;   AMPUTATION Left 03/11/2021   Procedure: LEFT FOOT 5TH  AND 4TH RAY AMPUTATION;  Surgeon: Harden Jerona GAILS, MD;  Location: [facility] OR;  Service: Orthopedics;  Laterality: Left;   AMPUTATION Left 08/28/2021   Procedure: AMPUTATION BELOW KNEE;  Surgeon: Harden Jerona GAILS, MD;  Location: [facility] OR;  Service: Orthopedics;  Laterality: Left;   AMPUTATION Left 01/29/2023   Procedure: LEFT AMPUTATION BELOW KNEE REVISION AND CLOSURE;  Surgeon: Harden Jerona GAILS, MD;  Location: [facility] OR;  Service: Orthopedics;  Laterality: Left;   AMPUTATION Left 07/27/2023   Procedure: LEFT ABOVE KNEE AMPUTATION;  Surgeon: Harden Jerona GAILS, MD;  Location: [facility] OR;  Service: Orthopedics;  Laterality: Left;   AORTIC VALVE REPLACEMENT N/A 12/05/2019   Procedure: PARTIAL STERNOTOMY FOR AORTIC VALVE REPLACEMENT (AVR), USING CARBOMEDICS SUPRA-ANNULAR TOP HAT ;  Surgeon: Dusty Sudie DEL, MD;  Location: [facility] OR;  Service: Open Heart Surgery;  Laterality: N/A;  No neck lines on left   APPLICATION OF WOUND VAC Left 10/30/2021   Procedure: APPLICATION OF WOUND VAC;  Surgeon: Harden Jerona GAILS, MD;  Location: [facility] OR;  Service: Orthopedics;  Laterality: Left;   APPLICATION OF WOUND VAC Left 12/25/2021  Procedure: APPLICATION OF WOUND VAC;  Surgeon: Harden Jerona GAILS, MD;  Location: [facility] OR;  Service: Orthopedics;  Laterality: Left;   APPLICATION OF WOUND VAC Left 01/26/2023   Procedure: APPLICATION OF WOUND VAC TO LEFT STUMP;  Surgeon: Jerri Kay HERO, MD;  Location: [facility] OR;  Service: Orthopedics;  Laterality: Left;   CARDIAC VALVE REPLACEMENT N/A    Phreesia 09/27/2020   I & D EXTREMITY Left 01/26/2023   Procedure: IRRIGATION AND DEBRIDEMENT OF LEFT BKA STUMP;  Surgeon: Jerri Kay HERO, MD;  Location: [facility] OR;  Service: Orthopedics;  Laterality: Left;   IR RADIOLOGY PERIPHERAL GUIDED IV START  10/05/2019   IR US  GUIDE VASC ACCESS RIGHT  10/05/2019    MULTIPLE EXTRACTIONS WITH ALVEOLOPLASTY N/A 10/26/2019   Procedure: EXTRACTION OF TOOTH #'S 3, 5-11,19-28,  AND 32 WITH ALVEOLOPLASTY;  Surgeon: Cyndee Tanda FALCON, DDS;  Location: [facility] OR;  Service: Oral Surgery;  Laterality: N/A;   RIGHT HEART CATH AND CORONARY ANGIOGRAPHY N/A 09/24/2019   Procedure: RIGHT HEART CATH AND CORONARY ANGIOGRAPHY;  Surgeon: Wonda Sharper, MD;  Location: [facility] INVASIVE CV LAB;  Service: Cardiovascular;  Laterality: N/A;   STUMP REVISION Left 10/30/2021   Procedure: REVISION LEFT BELOW KNEE AMPUTATION;  Surgeon: Harden Jerona GAILS, MD;  Location: [facility] OR;  Service: Orthopedics;  Laterality: Left;   STUMP REVISION Left 12/25/2021   Procedure: REVISION LEFT BELOW KNEE AMPUTATION;  Surgeon: Harden Jerona GAILS, MD;  Location: [facility] OR;  Service: Orthopedics;  Laterality: Left;   TEE WITHOUT CARDIOVERSION N/A 12/05/2019   Procedure: TRANSESOPHAGEAL ECHOCARDIOGRAM (TEE);  Surgeon: Dusty Sudie DEL, MD;  Location: [facility] OR;  Service: Open Heart Surgery;  Laterality: N/A;   TEE WITHOUT CARDIOVERSION N/A 09/01/2021   Procedure: TRANSESOPHAGEAL ECHOCARDIOGRAM (TEE);  Surgeon: Kate Lonni CROME, MD;  Location: [facility] ENDOSCOPY;  Service: Cardiovascular;  Laterality: N/A;   Patient Active Problem List   Diagnosis Date Noted   Vitamin B12 deficiency 02/15/2024   Iron deficiency anemia 12/22/2023   S/P AKA (above knee amputation), left (HCC) 07/31/2023   Hepatic steatosis 03/25/2023   GERD (gastroesophageal reflux disease) 01/24/2023   History of osteomyelitis 10/12/2022   Uncontrolled type 2 diabetes mellitus with hyperglycemia (HCC) 10/12/2022   CAD (coronary artery disease) 10/12/2022   Encounter for therapeutic drug monitoring 10/12/2022   Anxiety 09/30/2020   S/P aortic valve replacement with mechanical valve 12/05/2019   Hyperlipidemia associated with type 2 diabetes mellitus (HCC) 10/12/2015   Class 3 obesity 10/12/2015   HTN (hypertension) 07/02/2013    PCP: Bluford Jacqulyn MATSU,  DO  REFERRING PROVIDER: Valdemar Rocky SAUNDERS, NP  REFERRING DIAG: [phone] (ICD-10-CM) - S/P AKA (above knee amputation), left (HCC)  THERAPY DIAG:  S/P AKA (above knee amputation) unilateral, left (HCC)  Difficulty in walking, not elsewhere classified  Other abnormalities of gait and mobility  Unsteadiness on feet  Muscle weakness (generalized)  Rationale for Evaluation and Treatment: Rehabilitation  ONSET DATE: 07/27/23 s/p  SUBJECTIVE:   SUBJECTIVE STATEMENT: Pt states he has not been walking much at home. Pt presents with own walker. Pt states he has been doing some of the HEP with bed mobility. Pt is encouraged that he has been able to walk again.   S/p 07/27/23 left AKA.  Received prosthesis last month. History of multiple surgeries on the left lower extremity.   Previously had a below knee amputation and managed well but this prosthesis is quite a bit more challenging.  Has been using a WC primarily for mobility and uses  a walker to transfer (RW).  [employer] is prosthetist.  Ar[place] in [facility] with family cousins who assist as needed.    PERTINENT HISTORY: Anemia; getting iron infusions Has artificial heart valve. PAIN:  Are you having pain? Yes: NPRS scale: 0 Pain location: phantom pain left leg; right knee sometimes Pain description: nerve pain, right knee aching Aggravating factors: right knee when being up on it Relieving factors: Gabapentin , Tylenol   PRECAUTIONS: None  RED FLAGS: None   WEIGHT BEARING RESTRICTIONS: No  FALLS:  Has patient fallen in last 6 months? Yes. Number of falls 1  LIVING ENVIRONMENT: Lives with: lives with their family Lives in: House/apartment Stairs: No ramp entry Has following equipment at home: Vannie - 2 wheeled, Wheelchair (power), Wheelchair (manual), shower chair, bed side commode, and Ramped entry bed cane  OCCUPATION: not working; [employer] before  PLOF: Independent with household mobility with device  PATIENT GOALS: want to  walk on 2 feet; being able to work some  NEXT MD VISIT: hematologist in August  OBJECTIVE:  Note: Objective measures were completed at Evaluation unless otherwise noted.  DIAGNOSTIC FINDINGS:   PATIENT SURVEYS:  LEFS 27/80 33.8% Extreme difficulty/unable (0), Quite a bit of difficulty (1), Moderate difficulty (2), Little difficulty (3), No difficulty (4) Survey date:    Any of your usual work, housework or school activities   2. Usual hobbies, recreational or sporting activities   3. Getting into/out of the bath   4. Walking between rooms   5. Putting on socks/shoes   6. Squatting    7. Lifting an object, like a bag of groceries from the floor   8. Performing light activities around your home   9. Performing heavy activities around your home   10. Getting into/out of a car   11. Walking 2 blocks   12. Walking 1 mile   13. Going up/down 10 stairs (1 flight)   14. Standing for 1 hour   15.  sitting for 1 hour   16. Running on even ground   17. Running on uneven ground   18. Making sharp turns while running fast   19. Hopping    20. Rolling over in bed   Score total:  27/80     COGNITION: Overall cognitive status: Within functional limits for tasks assessed     SENSATION: Not tested  EDEMA:  None noted  POSTURE: rounded shoulders, forward head, flexed trunk , and weight shift right  PALPATION: No significant tenderness noted  LOWER EXTREMITY ROM:  Active ROM Right eval Left eval  Hip flexion    Hip extension    Hip abduction    Hip adduction    Hip internal rotation    Hip external rotation    Knee flexion    Knee extension    Ankle dorsiflexion    Ankle plantarflexion    Ankle inversion    Ankle eversion     (Blank rows = not tested)  LOWER EXTREMITY MMT:  MMT Right eval Left eval  Hip flexion    Hip extension    Hip abduction    Hip adduction    Hip internal rotation    Hip external rotation    Knee flexion    Knee extension    Ankle  dorsiflexion    Ankle plantarflexion    Ankle inversion    Ankle eversion     (Blank rows = not tested)   FUNCTIONAL TESTS:  30 seconds chair stand test x 1  with min A for safety to RW  GAIT: Distance walked: 0 ft Assistive device utilized: Environmental consultant - 2 wheeled Level of assistance: Min A Comments: patient unable to get left prosthesis to lock out to take a step                                                                                                                                TREATMENT DATE:  06/04/2024  Neuromuscular Re-education: -Weight shifting on and off prosthetic leg, 2 sets of 10 reps in parallel bars, pt cued for increased left hip extension  -Standing balance in parallel bars for safety, one UE support, no UE support, 30 seconds bout each Therapeutic Activity: -Step up and overs in parallel bars, 2 inch step, 2 sets of 1 rep, pt cued for LE sequencing and during turns for UE support -Donning and doffing liner and prosthetic, x1, therapist and cousin assist to ensure best fit. -Mat table to wheelchair transfer, x1, pt require WC on right side to complete stand pivot transfer from elevated mat table. Pt educated on the importance of reaching back for chair arm for safety with stand to sit transfer Gait Training: -Ambulation trial with personal RW with locked prosthetic, one bout of 8 feet to parallel bars, one bout of 11.08 feet max distance, pt requires mod therapist assist with ambulation  06/01/2024  Neuromuscular Re-education: -Weight shifting on and off prosthetic leg, 2 sets of 3 reps, pt cued for increased left hip extension to maintain locking position of prosthetic and for upright posture, pt requires heavy dependence on RW for completion. -Practice extending LLE hip for independent locking of knee, tactile cues required 2 sets of 5 reps Therapeutic Activity: -SLR, 1 set of 8 bilateral for training lifting leg into car, pt cued for increased ROM -Side lying  hip abductions, 1 set of 7 reps, LLE only with prosthetic donned for training lifting LLE into car, pt requires cueing for hand support if prosthesis slides down stub -Donning and doffing liner and prosthetic, x1, therapist and cousin assist to ensure best fit. -Wheel chair to mat table transfer x1, pt requires help taking arm off wheel chair, CGA for stand pivot transfer and cueing for sequencing -Mat table to wheelchair transfer, x1, pt require WC on right side to complete stand pivot transfer from elevated mat table. Gait Training: -Ambulation trials with RW and unlocked prosthetic, 3 bouts of 3 steps fwd and 3 steps bwd -Ambulation trial with RW with locked prosthetic, one bout of 8 feet to [facility], pt requires mod therapist assist with ambulation  05/10/2024  Neuromuscular Re-education: -Weight shifting on and off prosthetic leg, 2 sets of 9 reps, pt cued for increased left hip extension to maintain locking position of prosthetic and for upright posture, pt requires heavy dependence on RW for completion. Therapeutic Activity: -Sit to stands, 2 sets of 2 reps, pt cued for core activation and sequencing, pt requires heavy dependence  on RW for completion as well as from elevated mat table. -Donning and doffing liner and prosthetic, x1, therapist and cousin assist to ensure best fit. -Wheel chair to mat table transfer x1, pt requires help taking arm off wheel chair, CGA for stand pivot transfer and cueing for sequencing -Mat table to wheelchair transfer, x1, pt require WC on right side to complete stand pivot transfer from elevated mat table.   PATIENT EDUCATION:  Education details: Patient educated on exam findings, POC, scope of PT, HEP, and discussion regarding calling prosthetist for adjustments. Person educated: Patient Education method: Explanation, Demonstration, and Handouts Education comprehension: verbalized understanding, returned demonstration, verbal cues required, and tactile cues  required   HOME EXERCISE PROGRAM: Access Code: [email or web address] URL: [email or web address] Date: 04/26/2024 Prepared by: AP - Rehab  Exercises - Standing Single Leg Stance with Counter Support  - 2 x daily - 7 x weekly - 1 sets - 2 reps - Lying Prone  - 2 x daily - 7 x weekly - 1 sets - 1 reps - 5 min hold - Sidelying Hip Abduction (AKA)  - 2 x daily - 7 x weekly - 2 sets - 10 reps - Prone Hip Extension with Residual Limb (BKA)  - 2 x daily - 7 x weekly - 2 sets - 10 reps  ASSESSMENT:  CLINICAL IMPRESSION: Patient continues to demonstrate need for assist, donning sock and prosthetic, difficulty bearing weight through prothesis, decreased LE/core strength and endurance, decreased gait quality and balance. Patient did demonstrate ability to perform step up and overs with prosthesis today with moderate assist from therapist, in parallel bars for safety. Patient able to progress dynamic balance and core activation exercises today with multiple ambulation bouts and practice standing with no UE support on RW.  Pt also continues to need assist to manually lock the knee for ambulation. Pt requires education on sequencing for standing to sit transfer for reaching back to chair arm. Patient would continue to benefit from skilled physical therapy for increased independence with prosthesis, endurance with ambulation, increased LE strength, and improved balance for improved quality of life, improved independence with gait training and continued progress towards therapy goals.   Patient is a 48 y.o. male who was seen today for physical therapy evaluation and treatment for Z89.612 (ICD-10-CM) - S/P AKA (above knee amputation), left (HCC). Patient demonstrates decreased strength, balance deficits and gait abnormalities which are negatively impacting patient ability to perform ADLs and functional mobility tasks. Patient will benefit from skilled physical therapy services to address these deficits to  improve level of function with ADLs, functional mobility tasks, and reduce risk for falls.    OBJECTIVE IMPAIRMENTS: Abnormal gait, decreased activity tolerance, decreased balance, difficulty walking, decreased strength, obesity, and pain.   ACTIVITY LIMITATIONS: carrying, lifting, standing, squatting, stairs, transfers, bed mobility, and locomotion level  PARTICIPATION LIMITATIONS: meal prep, cleaning, shopping, community activity, and yard work  PERSONAL FACTORS: Time since onset of injury/illness/exacerbation and 1 comorbidity: anemia are also affecting patient's functional outcome.   REHAB POTENTIAL: Good  CLINICAL DECISION MAKING: Evolving/moderate complexity  EVALUATION COMPLEXITY: Moderate   GOALS: Goals reviewed with patient? No  SHORT TERM GOALS: Target date: 05/24/2024 patient will be independent with initial HEP  Baseline: Goal status: INITIAL  2.  Patient will report 30% improvement overall  Baseline:  Goal status: INITIAL  3.  Patient will be able to don and doff prosthesis independently Baseline: needs mod assist Goal status: INITIAL  4. LONG TERM GOALS: Target date: 06/21/24  Patient will be independent in self management strategies to improve quality of life and functional outcomes.  Baseline:  Goal status: INITIAL  2.  Patient will report 50% improvement overall  Baseline:  Goal status: INITIAL  3.  Patient will improve LEFS score by 23 points to demonstrate improved perceived function  Baseline: 27/80 Goal status: INITIAL  4.  Patient will ambulate with LRAD and prosthesis x 200 ft in 2 minutes or less Baseline: unable Goal status: INITIAL  PLAN:  PT FREQUENCY: 2x/week  PT DURATION: 8 weeks  PLANNED INTERVENTIONS: [id number]- PT Re-evaluation, 97110-Therapeutic exercises, 97530- Therapeutic activity, 97112- Neuromuscular re-education, 97535- Self Care, [id number]- Manual therapy, [id number]- Gait training, [phone]- Orthotic Fit/training, [phone]-  Canalith repositioning, [id number]- Aquatic Therapy, 97760- Splinting, [phone]- Wound care (first 20 sq cm), 97598- Wound care (each additional 20 sq cm)Patient/Family education, Balance training, Stair training, Taping, Dry Needling, Joint mobilization, Joint manipulation, Spinal manipulation, Spinal mobilization, Scar mobilization, and DME instructions.   PLAN FOR NEXT SESSION: Give printout of HEP; prosthetic donning and doffing and locking/unlocking; gait training, balance, transfers, bed mobility; left lower extremity strengthening, consider gravity minimized LE strengthening positions especially for left hip extension.  Lang Ada, PT, DPT [facility] Office: [phone] 10:36 AM, 06/04/24

## 2024-06-05 ENCOUNTER — Ambulatory Visit (HOSPITAL_COMMUNITY)

## 2024-06-06 ENCOUNTER — Other Ambulatory Visit: Payer: Self-pay | Admitting: Nurse Practitioner

## 2024-06-06 DIAGNOSIS — E785 Hyperlipidemia, unspecified: Secondary | ICD-10-CM

## 2024-06-06 DIAGNOSIS — Z01810 Encounter for preprocedural cardiovascular examination: Secondary | ICD-10-CM

## 2024-06-06 DIAGNOSIS — I10 Essential (primary) hypertension: Secondary | ICD-10-CM

## 2024-06-06 DIAGNOSIS — I4510 Unspecified right bundle-branch block: Secondary | ICD-10-CM

## 2024-06-06 DIAGNOSIS — R9431 Abnormal electrocardiogram [ECG] [EKG]: Secondary | ICD-10-CM

## 2024-06-06 DIAGNOSIS — R748 Abnormal levels of other serum enzymes: Secondary | ICD-10-CM

## 2024-06-06 DIAGNOSIS — I2510 Atherosclerotic heart disease of native coronary artery without angina pectoris: Secondary | ICD-10-CM

## 2024-06-06 DIAGNOSIS — Z952 Presence of prosthetic heart valve: Secondary | ICD-10-CM

## 2024-06-07 ENCOUNTER — Other Ambulatory Visit: Payer: Self-pay | Admitting: Nurse Practitioner

## 2024-06-07 ENCOUNTER — Encounter: Payer: Self-pay | Admitting: Nurse Practitioner

## 2024-06-07 ENCOUNTER — Encounter (HOSPITAL_COMMUNITY): Payer: Self-pay

## 2024-06-07 ENCOUNTER — Other Ambulatory Visit: Payer: Self-pay | Admitting: Family Medicine

## 2024-06-07 ENCOUNTER — Other Ambulatory Visit: Payer: Self-pay

## 2024-06-07 ENCOUNTER — Other Ambulatory Visit (HOSPITAL_COMMUNITY): Payer: Self-pay

## 2024-06-07 ENCOUNTER — Ambulatory Visit (HOSPITAL_COMMUNITY)

## 2024-06-07 DIAGNOSIS — R2681 Unsteadiness on feet: Secondary | ICD-10-CM

## 2024-06-07 DIAGNOSIS — Z89612 Acquired absence of left leg above knee: Secondary | ICD-10-CM | POA: Diagnosis not present

## 2024-06-07 DIAGNOSIS — R262 Difficulty in walking, not elsewhere classified: Secondary | ICD-10-CM

## 2024-06-07 DIAGNOSIS — R2689 Other abnormalities of gait and mobility: Secondary | ICD-10-CM

## 2024-06-07 DIAGNOSIS — M6281 Muscle weakness (generalized): Secondary | ICD-10-CM

## 2024-06-07 MED ORDER — GABAPENTIN 400 MG PO CAPS: 800.0000 mg | ORAL_CAPSULE | Freq: Three times a day (TID) | ORAL | 1 refills | Status: AC

## 2024-06-07 MED ORDER — LANTUS SOLOSTAR 100 UNIT/ML ~~LOC~~ SOPN: 38.0000 [IU] | PEN_INJECTOR | Freq: Every day | SUBCUTANEOUS | 3 refills | Status: AC

## 2024-06-07 MED ORDER — METFORMIN HCL ER (MOD) 1000 MG PO TB24: 1000.0000 mg | ORAL_TABLET | Freq: Every day | ORAL | 1 refills | Status: DC

## 2024-06-07 NOTE — Therapy (Signed)
 OUTPATIENT PHYSICAL THERAPY LOWER EXTREMITY TREATMENT   Patient Name: Adam Long MRN: [id number] DOB:Jun 05, 1976, 48 y.o., male Today's Date: 06/07/2024  END OF SESSION:  PT End of Session - 06/07/24 1355     Visit Number 5    Number of Visits 16    Date for PT Re-Evaluation 06/21/24    Authorization Type [place] [employer]    Authorization Time Period 27 PT/OT/ST please check auth    Authorization - Visit Number 4    Authorization - Number of Visits 27    Progress Note Due on Visit 10    PT Start Time 1356   late arrival   PT Stop Time 1425    PT Time Calculation (min) 29 min    Activity Tolerance Patient tolerated treatment well    Behavior During Therapy [facility] for tasks assessed/performed             Past Medical History:  Diagnosis Date   Anxiety    Aortic stenosis    Cellulitis and abscess of lower extremity 06/11/2019   Cellulitis of fourth toe of left foot    Cholelithiasis    Coronary artery disease    Nonobstructive CAD (40-50% LAD) 08/2019   Depression    Diabetes mellitus without complication (HCC)    Phreesia 09/27/2020   Elevated troponin level not due myocardial infarction 11/11/2019   Essential hypertension    Gangrene of toe of left foot (HCC) 07/06/2019   Heart murmur    Phreesia 09/27/2020   Hyperlipidemia    Phreesia 09/27/2020   Hypertension    Phreesia 09/27/2020   Mixed hyperlipidemia    Morbid obesity (HCC)    S/P aortic valve replacement with mechanical valve 12/05/2019   25 mm Carbomedics top hat bileaflet mechanical valve via partial upper hemi-sternotomy   Severe aortic stenosis 09/24/2019   Type 2 diabetes mellitus (HCC)    Past Surgical History:  Procedure Laterality Date   ABDOMINAL AORTOGRAM W/LOWER EXTREMITY N/A 07/06/2019   Procedure: ABDOMINAL AORTOGRAM W/LOWER EXTREMITY;  Surgeon: Harvey Carlin BRAVO, MD;  Location: [facility] INVASIVE CV LAB;  Service: Cardiovascular;  Laterality: N/A;  Bilateral   AMPUTATION Left 07/09/2019    Procedure: LEFT FOURTH and Fifth TOE AMPUTATION.;  Surgeon: Oris Krystal FALCON, MD;  Location: [facility] OR;  Service: Vascular;  Laterality: Left;   AMPUTATION Left 03/11/2021   Procedure: LEFT FOOT 5TH  AND 4TH RAY AMPUTATION;  Surgeon: Harden Jerona GAILS, MD;  Location: [facility] OR;  Service: Orthopedics;  Laterality: Left;   AMPUTATION Left 08/28/2021   Procedure: AMPUTATION BELOW KNEE;  Surgeon: Harden Jerona GAILS, MD;  Location: [facility] OR;  Service: Orthopedics;  Laterality: Left;   AMPUTATION Left 01/29/2023   Procedure: LEFT AMPUTATION BELOW KNEE REVISION AND CLOSURE;  Surgeon: Harden Jerona GAILS, MD;  Location: [facility] OR;  Service: Orthopedics;  Laterality: Left;   AMPUTATION Left 07/27/2023   Procedure: LEFT ABOVE KNEE AMPUTATION;  Surgeon: Harden Jerona GAILS, MD;  Location: [facility] OR;  Service: Orthopedics;  Laterality: Left;   AORTIC VALVE REPLACEMENT N/A 12/05/2019   Procedure: PARTIAL STERNOTOMY FOR AORTIC VALVE REPLACEMENT (AVR), USING CARBOMEDICS SUPRA-ANNULAR TOP HAT ;  Surgeon: Dusty Sudie DEL, MD;  Location: [facility] OR;  Service: Open Heart Surgery;  Laterality: N/A;  No neck lines on left   APPLICATION OF WOUND VAC Left 10/30/2021   Procedure: APPLICATION OF WOUND VAC;  Surgeon: Harden Jerona GAILS, MD;  Location: [facility] OR;  Service: Orthopedics;  Laterality: Left;   APPLICATION OF WOUND  VAC Left 12/25/2021   Procedure: APPLICATION OF WOUND VAC;  Surgeon: Harden Jerona GAILS, MD;  Location: [facility] OR;  Service: Orthopedics;  Laterality: Left;   APPLICATION OF WOUND VAC Left 01/26/2023   Procedure: APPLICATION OF WOUND VAC TO LEFT STUMP;  Surgeon: Jerri Kay HERO, MD;  Location: [facility] OR;  Service: Orthopedics;  Laterality: Left;   CARDIAC VALVE REPLACEMENT N/A    Phreesia 09/27/2020   I & D EXTREMITY Left 01/26/2023   Procedure: IRRIGATION AND DEBRIDEMENT OF LEFT BKA STUMP;  Surgeon: Jerri Kay HERO, MD;  Location: [facility] OR;  Service: Orthopedics;  Laterality: Left;   IR RADIOLOGY PERIPHERAL GUIDED IV START  10/05/2019   IR US  GUIDE VASC ACCESS RIGHT   10/05/2019   MULTIPLE EXTRACTIONS WITH ALVEOLOPLASTY N/A 10/26/2019   Procedure: EXTRACTION OF TOOTH #'S 3, 5-11,19-28,  AND 32 WITH ALVEOLOPLASTY;  Surgeon: Cyndee Tanda FALCON, DDS;  Location: [facility] OR;  Service: Oral Surgery;  Laterality: N/A;   RIGHT HEART CATH AND CORONARY ANGIOGRAPHY N/A 09/24/2019   Procedure: RIGHT HEART CATH AND CORONARY ANGIOGRAPHY;  Surgeon: Wonda Sharper, MD;  Location: [facility] INVASIVE CV LAB;  Service: Cardiovascular;  Laterality: N/A;   STUMP REVISION Left 10/30/2021   Procedure: REVISION LEFT BELOW KNEE AMPUTATION;  Surgeon: Harden Jerona GAILS, MD;  Location: [facility] OR;  Service: Orthopedics;  Laterality: Left;   STUMP REVISION Left 12/25/2021   Procedure: REVISION LEFT BELOW KNEE AMPUTATION;  Surgeon: Harden Jerona GAILS, MD;  Location: [facility] OR;  Service: Orthopedics;  Laterality: Left;   TEE WITHOUT CARDIOVERSION N/A 12/05/2019   Procedure: TRANSESOPHAGEAL ECHOCARDIOGRAM (TEE);  Surgeon: Dusty Sudie DEL, MD;  Location: [facility] OR;  Service: Open Heart Surgery;  Laterality: N/A;   TEE WITHOUT CARDIOVERSION N/A 09/01/2021   Procedure: TRANSESOPHAGEAL ECHOCARDIOGRAM (TEE);  Surgeon: Kate Lonni CROME, MD;  Location: [facility] ENDOSCOPY;  Service: Cardiovascular;  Laterality: N/A;   Patient Active Problem List   Diagnosis Date Noted   Vitamin B12 deficiency 02/15/2024   Iron deficiency anemia 12/22/2023   S/P AKA (above knee amputation), left (HCC) 07/31/2023   Hepatic steatosis 03/25/2023   GERD (gastroesophageal reflux disease) 01/24/2023   History of osteomyelitis 10/12/2022   Uncontrolled type 2 diabetes mellitus with hyperglycemia (HCC) 10/12/2022   CAD (coronary artery disease) 10/12/2022   Encounter for therapeutic drug monitoring 10/12/2022   Anxiety 09/30/2020   S/P aortic valve replacement with mechanical valve 12/05/2019   Hyperlipidemia associated with type 2 diabetes mellitus (HCC) 10/12/2015   Class 3 obesity 10/12/2015   HTN (hypertension) 07/02/2013    PCP: Bluford Jacqulyn MATSU,  DO  REFERRING PROVIDER: Valdemar Rocky SAUNDERS, NP  REFERRING DIAG: [phone] (ICD-10-CM) - S/P AKA (above knee amputation), left (HCC)  THERAPY DIAG:  S/P AKA (above knee amputation) unilateral, left (HCC)  Difficulty in walking, not elsewhere classified  Other abnormalities of gait and mobility  Unsteadiness on feet  Muscle weakness (generalized)  Rationale for Evaluation and Treatment: Rehabilitation  ONSET DATE: 07/27/23 s/p  SUBJECTIVE:   SUBJECTIVE STATEMENT: Pt states he has walked around the counter a few times at the house. Pt states he has been doing some of the HEP with bed mobility. Pt reports only donning the prosthetic a couple of times at home.  S/p 07/27/23 left AKA.  Received prosthesis last month. History of multiple surgeries on the left lower extremity.   Previously had a below knee amputation and managed well but this prosthesis is quite a bit more challenging.  Has been using a WC primarily  for mobility and uses a walker to transfer (RW).  [employer] is prosthetist.  Arrives in [facility] with family cousins who assist as needed.    PERTINENT HISTORY: Anemia; getting iron infusions Has artificial heart valve. PAIN:  Are you having pain? Yes: NPRS scale: 0 Pain location: phantom pain left leg; right knee sometimes Pain description: nerve pain, right knee aching Aggravating factors: right knee when being up on it Relieving factors: Gabapentin , Tylenol   PRECAUTIONS: None  RED FLAGS: None   WEIGHT BEARING RESTRICTIONS: No  FALLS:  Has patient fallen in last 6 months? Yes. Number of falls 1  LIVING ENVIRONMENT: Lives with: lives with their family Lives in: House/apartment Stairs: No ramp entry Has following equipment at home: Vannie - 2 wheeled, Wheelchair (power), Wheelchair (manual), shower chair, bed side commode, and Ramped entry bed cane  OCCUPATION: not working; [employer] before  PLOF: Independent with household mobility with device  PATIENT GOALS: want  to walk on 2 feet; being able to work some  NEXT MD VISIT: hematologist in August  OBJECTIVE:  Note: Objective measures were completed at Evaluation unless otherwise noted.  DIAGNOSTIC FINDINGS:   PATIENT SURVEYS:  LEFS 27/80 33.8% Extreme difficulty/unable (0), Quite a bit of difficulty (1), Moderate difficulty (2), Little difficulty (3), No difficulty (4) Survey date:    Any of your usual work, housework or school activities   2. Usual hobbies, recreational or sporting activities   3. Getting into/out of the bath   4. Walking between rooms   5. Putting on socks/shoes   6. Squatting    7. Lifting an object, like a bag of groceries from the floor   8. Performing light activities around your home   9. Performing heavy activities around your home   10. Getting into/out of a car   11. Walking 2 blocks   12. Walking 1 mile   13. Going up/down 10 stairs (1 flight)   14. Standing for 1 hour   15.  sitting for 1 hour   16. Running on even ground   17. Running on uneven ground   18. Making sharp turns while running fast   19. Hopping    20. Rolling over in bed   Score total:  27/80     COGNITION: Overall cognitive status: Within functional limits for tasks assessed     SENSATION: Not tested  EDEMA:  None noted  POSTURE: rounded shoulders, forward head, flexed trunk , and weight shift right  PALPATION: No significant tenderness noted  LOWER EXTREMITY ROM:  Active ROM Right eval Left eval  Hip flexion    Hip extension    Hip abduction    Hip adduction    Hip internal rotation    Hip external rotation    Knee flexion    Knee extension    Ankle dorsiflexion    Ankle plantarflexion    Ankle inversion    Ankle eversion     (Blank rows = not tested)  LOWER EXTREMITY MMT:  MMT Right eval Left eval  Hip flexion    Hip extension    Hip abduction    Hip adduction    Hip internal rotation    Hip external rotation    Knee flexion    Knee extension    Ankle  dorsiflexion    Ankle plantarflexion    Ankle inversion    Ankle eversion     (Blank rows = not tested)   FUNCTIONAL TESTS:  30 seconds chair  stand test x 1 with min A for safety to RW  GAIT: Distance walked: 0 ft Assistive device utilized: Environmental consultant - 2 wheeled Level of assistance: Min A Comments: patient unable to get left prosthesis to lock out to take a step                                                                                                                                TREATMENT DATE:  06/07/2024  Neuromuscular Re-education: -Weight shifting on and off prosthetic leg, 2 sets of 10 reps in RW, pt cued for increased left hip extension  Therapeutic Activity: -Mat table to wheelchair transfer, x1, pt require WC on right side to complete stand pivot transfer from elevated mat table. Pt educated on the importance of reaching back for chair arm for safety with stand to sit transfer Gait Training: -Ambulation trial with personal RW with locked prosthetic, five bouts of  about 12 feet to and from hallway, pt requires mod therapist assist with ambulation, pt cued for foot placement as pt continues to demonstrate decreased balance and narrow BOS  06/04/2024  Neuromuscular Re-education: -Weight shifting on and off prosthetic leg, 2 sets of 10 reps in parallel bars, pt cued for increased left hip extension  -Standing balance in parallel bars for safety, one UE support, no UE support, 30 seconds bout each Therapeutic Activity: -Step up and overs in parallel bars, 2 inch step, 2 sets of 1 rep, pt cued for LE sequencing and during turns for UE support -Donning and doffing liner and prosthetic, x1, therapist and cousin assist to ensure best fit. -Mat table to wheelchair transfer, x1, pt require WC on right side to complete stand pivot transfer from elevated mat table. Pt educated on the importance of reaching back for chair arm for safety with stand to sit transfer Gait  Training: -Ambulation trial with personal RW with locked prosthetic, one bout of 8 feet to parallel bars, one bout of 11.08 feet max distance, pt requires mod therapist assist with ambulation  06/01/2024  Neuromuscular Re-education: -Weight shifting on and off prosthetic leg, 2 sets of 3 reps, pt cued for increased left hip extension to maintain locking position of prosthetic and for upright posture, pt requires heavy dependence on RW for completion. -Practice extending LLE hip for independent locking of knee, tactile cues required 2 sets of 5 reps Therapeutic Activity: -SLR, 1 set of 8 bilateral for training lifting leg into car, pt cued for increased ROM -Side lying hip abductions, 1 set of 7 reps, LLE only with prosthetic donned for training lifting LLE into car, pt requires cueing for hand support if prosthesis slides down stub -Donning and doffing liner and prosthetic, x1, therapist and cousin assist to ensure best fit. -Wheel chair to mat table transfer x1, pt requires help taking arm off wheel chair, CGA for stand pivot transfer and cueing for sequencing -Mat table to wheelchair transfer, x1, pt require WC  on right side to complete stand pivot transfer from elevated mat table. Gait Training: -Ambulation trials with RW and unlocked prosthetic, 3 bouts of 3 steps fwd and 3 steps bwd -Ambulation trial with RW with locked prosthetic, one bout of 8 feet to WC, pt requires mod therapist assist with ambulation   PATIENT EDUCATION:  Education details: Patient educated on exam findings, POC, scope of PT, HEP, and discussion regarding calling prosthetist for adjustments. Person educated: Patient Education method: Explanation, Demonstration, and Handouts Education comprehension: verbalized understanding, returned demonstration, verbal cues required, and tactile cues required   HOME EXERCISE PROGRAM: Access Code: [email or web address] URL: [email or web address] Date: 04/26/2024 Prepared by: AP  - Rehab  Exercises - Standing Single Leg Stance with Counter Support  - 2 x daily - 7 x weekly - 1 sets - 2 reps - Lying Prone  - 2 x daily - 7 x weekly - 1 sets - 1 reps - 5 min hold - Sidelying Hip Abduction (AKA)  - 2 x daily - 7 x weekly - 2 sets - 10 reps - Prone Hip Extension with Residual Limb (BKA)  - 2 x daily - 7 x weekly - 2 sets - 10 reps  ASSESSMENT:  CLINICAL IMPRESSION: Patient continues to demonstrate decreased LE strength, decreased gait quality, functional mobility and balance. Patient also demonstrates decreased endurance with aerobic based exercise during today's session with a rest break required after every walking bout of about 12 feet. Patient able to continue dynamic balance and gait activities today with increased reps and increased distance, good performance with verbal cueing. Pt still demonstrating need for min/mod therapist assist with functional transfers and ambulation with RW. Patient would continue to benefit from skilled physical therapy for increased endurance with ambulation, increased LE strength, and improved balance for improved quality of life, improved independence with gait training and continued progress towards therapy goals.    Patient is a 48 y.o. male who was seen today for physical therapy evaluation and treatment for Z89.612 (ICD-10-CM) - S/P AKA (above knee amputation), left (HCC). Patient demonstrates decreased strength, balance deficits and gait abnormalities which are negatively impacting patient ability to perform ADLs and functional mobility tasks. Patient will benefit from skilled physical therapy services to address these deficits to improve level of function with ADLs, functional mobility tasks, and reduce risk for falls.    OBJECTIVE IMPAIRMENTS: Abnormal gait, decreased activity tolerance, decreased balance, difficulty walking, decreased strength, obesity, and pain.   ACTIVITY LIMITATIONS: carrying, lifting, standing, squatting, stairs,  transfers, bed mobility, and locomotion level  PARTICIPATION LIMITATIONS: meal prep, cleaning, shopping, community activity, and yard work  PERSONAL FACTORS: Time since onset of injury/illness/exacerbation and 1 comorbidity: anemia are also affecting patient's functional outcome.   REHAB POTENTIAL: Good  CLINICAL DECISION MAKING: Evolving/moderate complexity  EVALUATION COMPLEXITY: Moderate   GOALS: Goals reviewed with patient? No  SHORT TERM GOALS: Target date: 05/24/2024 patient will be independent with initial HEP  Baseline: Goal status: INITIAL  2.  Patient will report 30% improvement overall  Baseline:  Goal status: INITIAL  3.  Patient will be able to don and doff prosthesis independently Baseline: needs mod assist Goal status: INITIAL  4. LONG TERM GOALS: Target date: 06/21/24  Patient will be independent in self management strategies to improve quality of life and functional outcomes.  Baseline:  Goal status: INITIAL  2.  Patient will report 50% improvement overall  Baseline:  Goal status: INITIAL  3.  Patient will improve LEFS score by 23 points  to demonstrate improved perceived function  Baseline: 27/80 Goal status: INITIAL  4.  Patient will ambulate with LRAD and prosthesis x 200 ft in 2 minutes or less Baseline: unable Goal status: INITIAL  PLAN:  PT FREQUENCY: 2x/week  PT DURATION: 8 weeks  PLANNED INTERVENTIONS: [id number]- PT Re-evaluation, 97110-Therapeutic exercises, 97530- Therapeutic activity, 97112- Neuromuscular re-education, 97535- Self Care, [id number]- Manual therapy, [id number]- Gait training, [phone]- Orthotic Fit/training, [phone]- Canalith repositioning, [id number]- Aquatic Therapy, 97760- Splinting, [phone]- Wound care (first 20 sq cm), 97598- Wound care (each additional 20 sq cm)Patient/Family education, Balance training, Stair training, Taping, Dry Needling, Joint mobilization, Joint manipulation, Spinal manipulation, Spinal mobilization, Scar  mobilization, and DME instructions.   PLAN FOR NEXT SESSION: Give printout of HEP; prosthetic donning and doffing and locking/unlocking; gait training, balance, transfers, bed mobility; left lower extremity strengthening, consider gravity minimized LE strengthening positions especially for left hip extension.  Lang Ada, PT, DPT [facility] Office: [phone] 5:27 PM, 06/07/24

## 2024-06-07 NOTE — Telephone Encounter (Signed)
 Can you check in to why this patient is scheduled with Motw[facility] at [employer] and me?  He is supposed to be seeing them next week but he is asking for refills from me.

## 2024-06-11 ENCOUNTER — Encounter (HOSPITAL_COMMUNITY)

## 2024-06-11 NOTE — Telephone Encounter (Signed)
I changed the script

## 2024-06-12 ENCOUNTER — Ambulatory Visit: Attending: Nurse Practitioner

## 2024-06-12 DIAGNOSIS — Z952 Presence of prosthetic heart valve: Secondary | ICD-10-CM | POA: Insufficient documentation

## 2024-06-12 DIAGNOSIS — R9431 Abnormal electrocardiogram [ECG] [EKG]: Secondary | ICD-10-CM | POA: Diagnosis present

## 2024-06-12 DIAGNOSIS — I4510 Unspecified right bundle-branch block: Secondary | ICD-10-CM | POA: Insufficient documentation

## 2024-06-12 LAB — ECHOCARDIOGRAM COMPLETE
AR max vel: 1.41 cm2
AV Area VTI: 1.44 cm2
AV Area mean vel: 1.35 cm2
AV Mean grad: 18 mmHg
AV Peak grad: 31.4 mmHg
Ao pk vel: 2.8 m/s
Area-P 1/2: 5.23 cm2
Calc EF: 56.5 %
MV VTI: 3.23 cm2
S' Lateral: 2.8 cm
Single Plane A2C EF: 68.8 %
Single Plane A4C EF: 45.5 %

## 2024-06-12 MED ORDER — PERFLUTREN LIPID MICROSPHERE
1.0000 mL | INTRAVENOUS | Status: AC | PRN
  Administered 2024-06-12: 10 mL via INTRAVENOUS

## 2024-06-14 ENCOUNTER — Ambulatory Visit (HOSPITAL_COMMUNITY)

## 2024-06-14 ENCOUNTER — Encounter (HOSPITAL_COMMUNITY): Payer: Self-pay

## 2024-06-14 DIAGNOSIS — M6281 Muscle weakness (generalized): Secondary | ICD-10-CM

## 2024-06-14 DIAGNOSIS — Z89612 Acquired absence of left leg above knee: Secondary | ICD-10-CM

## 2024-06-14 DIAGNOSIS — R2689 Other abnormalities of gait and mobility: Secondary | ICD-10-CM

## 2024-06-14 DIAGNOSIS — R262 Difficulty in walking, not elsewhere classified: Secondary | ICD-10-CM

## 2024-06-14 DIAGNOSIS — R2681 Unsteadiness on feet: Secondary | ICD-10-CM

## 2024-06-14 NOTE — Therapy (Signed)
 OUTPATIENT PHYSICAL THERAPY LOWER EXTREMITY TREATMENT   Patient Name: Adam Long MRN: [id number] DOB:June 05, 1976, 48 y.o., male Today's Date: 06/14/2024  END OF SESSION:  PT End of Session - 06/14/24 1059     Visit Number 6    Number of Visits 16    Date for Recertification  06/21/24    Authorization Type [place] Medicaid [employer]    Authorization Time Period 27 PT/OT/ST please check auth    Authorization - Visit Number 5    Authorization - Number of Visits 27    Progress Note Due on Visit 10    PT Start Time 1059    PT Stop Time 1140    PT Time Calculation (min) 41 min    Activity Tolerance Patient tolerated treatment well    Behavior During Therapy WFL for tasks assessed/performed              Past Medical History:  Diagnosis Date   Anxiety    Aortic stenosis    Cellulitis and abscess of lower extremity 06/11/2019   Cellulitis of fourth toe of left foot    Cholelithiasis    Coronary artery disease    Nonobstructive CAD (40-50% LAD) 08/2019   Depression    Diabetes mellitus without complication (HCC)    Phreesia 09/27/2020   Elevated troponin level not due myocardial infarction 11/11/2019   Essential hypertension    Gangrene of toe of left foot (HCC) 07/06/2019   Heart murmur    Phreesia 09/27/2020   Hyperlipidemia    Phreesia 09/27/2020   Hypertension    Phreesia 09/27/2020   Mixed hyperlipidemia    Morbid obesity (HCC)    S/P aortic valve replacement with mechanical valve 12/05/2019   25 mm Carbomedics top hat bileaflet mechanical valve via partial upper hemi-sternotomy   Severe aortic stenosis 09/24/2019   Type 2 diabetes mellitus (HCC)    Past Surgical History:  Procedure Laterality Date   ABDOMINAL AORTOGRAM W/LOWER EXTREMITY N/A 07/06/2019   Procedure: ABDOMINAL AORTOGRAM W/LOWER EXTREMITY;  Surgeon: Harvey Carlin BRAVO, MD;  Location: [facility] INVASIVE [facility];  Service: Cardiovascular;  Laterality: N/A;  Bilateral   AMPUTATION Left 07/09/2019    Procedure: LEFT FOURTH and Fifth TOE AMPUTATION.;  Surgeon: Oris Krystal FALCON, MD;  Location: [facility] OR;  Service: Vascular;  Laterality: Left;   AMPUTATION Left 03/11/2021   Procedure: LEFT FOOT 5TH  AND 4TH RAY AMPUTATION;  Surgeon: Harden Jerona GAILS, MD;  Location: [facility] OR;  Service: Orthopedics;  Laterality: Left;   AMPUTATION Left 08/28/2021   Procedure: AMPUTATION BELOW KNEE;  Surgeon: Harden Jerona GAILS, MD;  Location: [facility] OR;  Service: Orthopedics;  Laterality: Left;   AMPUTATION Left 01/29/2023   Procedure: LEFT AMPUTATION BELOW KNEE REVISION AND CLOSURE;  Surgeon: Harden Jerona GAILS, MD;  Location: [facility] OR;  Service: Orthopedics;  Laterality: Left;   AMPUTATION Left 07/27/2023   Procedure: LEFT ABOVE KNEE AMPUTATION;  Surgeon: Harden Jerona GAILS, MD;  Location: [facility] OR;  Service: Orthopedics;  Laterality: Left;   AORTIC VALVE REPLACEMENT N/A 12/05/2019   Procedure: PARTIAL STERNOTOMY FOR AORTIC VALVE REPLACEMENT (AVR), USING CARBOMEDICS SUPRA-ANNULAR TOP HAT ;  Surgeon: Dusty Sudie DEL, MD;  Location: [facility] OR;  Service: Open Heart Surgery;  Laterality: N/A;  No neck lines on left   APPLICATION OF WOUND VAC Left 10/30/2021   Procedure: APPLICATION OF WOUND VAC;  Surgeon: Harden Jerona GAILS, MD;  Location: [facility] OR;  Service: Orthopedics;  Laterality: Left;   APPLICATION OF WOUND VAC Left  12/25/2021   Procedure: APPLICATION OF WOUND VAC;  Surgeon: Harden Jerona GAILS, MD;  Location: [facility] OR;  Service: Orthopedics;  Laterality: Left;   APPLICATION OF WOUND VAC Left 01/26/2023   Procedure: APPLICATION OF WOUND VAC TO LEFT STUMP;  Surgeon: Jerri Kay HERO, MD;  Location: [facility] OR;  Service: Orthopedics;  Laterality: Left;   CARDIAC VALVE REPLACEMENT N/A    Phreesia 09/27/2020   I & D EXTREMITY Left 01/26/2023   Procedure: IRRIGATION AND DEBRIDEMENT OF LEFT BKA STUMP;  Surgeon: Jerri Kay HERO, MD;  Location: [facility] OR;  Service: Orthopedics;  Laterality: Left;   IR RADIOLOGY PERIPHERAL GUIDED IV START  10/05/2019   IR US  GUIDE VASC ACCESS RIGHT  10/05/2019    MULTIPLE EXTRACTIONS WITH ALVEOLOPLASTY N/A 10/26/2019   Procedure: EXTRACTION OF TOOTH #'S 3, 5-11,19-28,  AND 32 WITH ALVEOLOPLASTY;  Surgeon: Cyndee Tanda FALCON, DDS;  Location: [facility] OR;  Service: Oral Surgery;  Laterality: N/A;   RIGHT HEART CATH AND CORONARY ANGIOGRAPHY N/A 09/24/2019   Procedure: RIGHT HEART CATH AND CORONARY ANGIOGRAPHY;  Surgeon: Wonda Sharper, MD;  Location: [facility] INVASIVE CV LAB;  Service: Cardiovascular;  Laterality: N/A;   STUMP REVISION Left 10/30/2021   Procedure: REVISION LEFT BELOW KNEE AMPUTATION;  Surgeon: Harden Jerona GAILS, MD;  Location: [facility] OR;  Service: Orthopedics;  Laterality: Left;   STUMP REVISION Left 12/25/2021   Procedure: REVISION LEFT BELOW KNEE AMPUTATION;  Surgeon: Harden Jerona GAILS, MD;  Location: [facility] OR;  Service: Orthopedics;  Laterality: Left;   TEE WITHOUT CARDIOVERSION N/A 12/05/2019   Procedure: TRANSESOPHAGEAL ECHOCARDIOGRAM (TEE);  Surgeon: Dusty Sudie DEL, MD;  Location: [facility] OR;  Service: Open Heart Surgery;  Laterality: N/A;   TEE WITHOUT CARDIOVERSION N/A 09/01/2021   Procedure: TRANSESOPHAGEAL ECHOCARDIOGRAM (TEE);  Surgeon: Kate Lonni CROME, MD;  Location: [facility] ENDOSCOPY;  Service: Cardiovascular;  Laterality: N/A;   Patient Active Problem List   Diagnosis Date Noted   Vitamin B12 deficiency 02/15/2024   Iron deficiency anemia 12/22/2023   S/P AKA (above knee amputation), left (HCC) 07/31/2023   Hepatic steatosis 03/25/2023   GERD (gastroesophageal reflux disease) 01/24/2023   History of osteomyelitis 10/12/2022   Uncontrolled type 2 diabetes mellitus with hyperglycemia (HCC) 10/12/2022   CAD (coronary artery disease) 10/12/2022   Encounter for therapeutic drug monitoring 10/12/2022   Anxiety 09/30/2020   S/P aortic valve replacement with mechanical valve 12/05/2019   Hyperlipidemia associated with type 2 diabetes mellitus (HCC) 10/12/2015   Class 3 obesity 10/12/2015   HTN (hypertension) 07/02/2013    PCP: Bluford Jacqulyn MATSU,  DO  REFERRING PROVIDER: Valdemar Rocky SAUNDERS, NP  REFERRING DIAG: [phone] (ICD-10-CM) - S/P AKA (above knee amputation), left (HCC)  THERAPY DIAG:  S/P AKA (above knee amputation) unilateral, left (HCC)  Difficulty in walking, not elsewhere classified  Other abnormalities of gait and mobility  Unsteadiness on feet  Muscle weakness (generalized)  Rationale for Evaluation and Treatment: Rehabilitation  ONSET DATE: 07/27/23 s/p  SUBJECTIVE:   SUBJECTIVE STATEMENT: Pt states he had a echo Tuesday for heart checkup, has not heard if everything is okay or not. Pt states he had some bowel issues Monday and was not able to make appointment. Pt states he has a colonoscopy to investigate. Pt states he did put prosthesis on Monday and stood up a few times.   S/p 07/27/23 left AKA.  Received prosthesis last month. History of multiple surgeries on the left lower extremity.   Previously had a below knee amputation and managed well  but this prosthesis is quite a bit more challenging.  Has been using a WC primarily for mobility and uses a walker to transfer (RW).  [employer] is prosthetist.  Arrives in [facility] with family cousins who assist as needed.    PERTINENT HISTORY: Anemia; getting iron infusions Has artificial heart valve. PAIN:  Are you having pain? Yes: NPRS scale: 0 Pain location: phantom pain left leg; right knee sometimes Pain description: nerve pain, right knee aching Aggravating factors: right knee when being up on it Relieving factors: Gabapentin , Tylenol   PRECAUTIONS: None  RED FLAGS: None   WEIGHT BEARING RESTRICTIONS: No  FALLS:  Has patient fallen in last 6 months? Yes. Number of falls 1  LIVING ENVIRONMENT: Lives with: lives with their family Lives in: House/apartment Stairs: No ramp entry Has following equipment at home: Vannie - 2 wheeled, Wheelchair (power), Wheelchair (manual), shower chair, bed side commode, and Ramped entry bed cane  OCCUPATION: not working; [employer] before  PLOF: Independent with household mobility with device  PATIENT GOALS: want to walk on 2 feet; being able to work some  NEXT MD VISIT: hematologist in August  OBJECTIVE:  Note: Objective measures were completed at Evaluation unless otherwise noted.  DIAGNOSTIC FINDINGS:   PATIENT SURVEYS:  LEFS 27/80 33.8% Extreme difficulty/unable (0), Quite a bit of difficulty (1), Moderate difficulty (2), Little difficulty (3), No difficulty (4) Survey date:    Any of your usual work, housework or school activities   2. Usual hobbies, recreational or sporting activities   3. Getting into/out of the bath   4. Walking between rooms   5. Putting on socks/shoes   6. Squatting    7. Lifting an object, like a bag of groceries from the floor   8. Performing light activities around your home   9. Performing heavy activities around your home   10. Getting into/out of a car   11. Walking 2 blocks   12. Walking 1 mile   13. Going up/down 10 stairs (1 flight)   14. Standing for 1 hour   15.  sitting for 1 hour   16. Running on even ground   17. Running on uneven ground   18. Making sharp turns while running fast   19. Hopping    20. Rolling over in bed   Score total:  27/80     COGNITION: Overall cognitive status: Within functional limits for tasks assessed     SENSATION: Not tested  EDEMA:  None noted  POSTURE: rounded shoulders, forward head, flexed trunk , and weight shift right  PALPATION: No significant tenderness noted  LOWER EXTREMITY ROM:  Active ROM Right eval Left eval  Hip flexion    Hip extension    Hip abduction    Hip adduction    Hip internal rotation    Hip external rotation    Knee flexion    Knee extension    Ankle dorsiflexion    Ankle plantarflexion    Ankle inversion    Ankle eversion     (Blank rows = not tested)  LOWER EXTREMITY MMT:  MMT Right eval Left eval  Hip flexion    Hip extension    Hip abduction    Hip adduction     Hip internal rotation    Hip external rotation    Knee flexion    Knee extension    Ankle dorsiflexion    Ankle plantarflexion    Ankle inversion    Ankle eversion     (  Blank rows = not tested)   FUNCTIONAL TESTS:  30 seconds chair stand test x 1 with min A for safety to RW  GAIT: Distance walked: 0 ft Assistive device utilized: Environmental consultant - 2 wheeled Level of assistance: Min A Comments: patient unable to get left prosthesis to lock out to take a step                                                                                                                                TREATMENT DATE:  06/14/2024  Neuromuscular Re-education: -Weight shifting on and off unlocked prosthetic leg, 2 sets of 10 reps in RW, pt cued for increased left hip extension, BUE support required -Cone touches from 2 inch step, with LLE with lock out with hip extension, 2 sets of 6 reps pt cued for upright posture and increased hip extension, BUE support required Therapeutic Activity: -Mat table to wheelchair transfer, x1, pt require WC on right side to complete stand pivot transfer from elevated mat table. Pt educated on the importance of reaching back for chair arm for safety with stand to sit transfer Gait Training: -Ambulation trial with personal RW with locked prosthetic, 1 bout of  about 12 feet to parallel bars, pt requires mod therapist assist with ambulation, pt cued for foot placement as pt continues to demonstrate decreased balance and narrow BOS  06/07/2024  Neuromuscular Re-education: -Weight shifting on and off prosthetic leg, 2 sets of 10 reps in RW, pt cued for increased left hip extension  Therapeutic Activity: -Mat table to wheelchair transfer, x1, pt require WC on right side to complete stand pivot transfer from elevated mat table. Pt educated on the importance of reaching back for chair arm for safety with stand to sit transfer Gait Training: -Ambulation trial with personal RW with locked  prosthetic, five bouts of  about 12 feet to and from hallway, pt requires mod therapist assist with ambulation, pt cued for foot placement as pt continues to demonstrate decreased balance and narrow BOS  06/04/2024  Neuromuscular Re-education: -Weight shifting on and off prosthetic leg, 2 sets of 10 reps in parallel bars, pt cued for increased left hip extension  -15 reps of unlocking prosthetic knee while weight bearing,  -Standing balance in parallel bars for safety, one UE support, no UE support, 30 seconds bout each Therapeutic Activity: -Step up and overs in parallel bars, 2 inch step, 2 sets of 1 rep, pt cued for LE sequencing and during turns for UE support -Donning and doffing liner and prosthetic, x1, therapist and cousin assist to ensure best fit. -Mat table to wheelchair transfer, x1, pt require WC on right side to complete stand pivot transfer from elevated mat table. Pt educated on the importance of reaching back for chair arm for safety with stand to sit transfer Gait Training: -Ambulation trial with personal RW with locked prosthetic, one bout of 8 feet to parallel bars, one bout of 11.08  feet max distance, pt requires mod therapist assist with ambulation   PATIENT EDUCATION:  Education details: Patient educated on exam findings, POC, scope of PT, HEP, and discussion regarding calling prosthetist for adjustments. Person educated: Patient Education method: Explanation, Demonstration, and Handouts Education comprehension: verbalized understanding, returned demonstration, verbal cues required, and tactile cues required   HOME EXERCISE PROGRAM: Access Code: [email or web address] URL: [email or web address] Date: 04/26/2024 Prepared by: [facility] - Rehab  Exercises - Standing Single Leg Stance with Counter Support  - 2 x daily - 7 x weekly - 1 sets - 2 reps - Lying Prone  - 2 x daily - 7 x weekly - 1 sets - 1 reps - 5 min hold - Sidelying Hip Abduction (AKA)  - 2 x daily - 7 x weekly  - 2 sets - 10 reps - Prone Hip Extension with Residual Limb (BKA)  - 2 x daily - 7 x weekly - 2 sets - 10 reps  ASSESSMENT:  CLINICAL IMPRESSION: Patient continues to demonstrate decreased functional mobility, LE strength, decreased gait quality and balance. Patient also continues to demonstrate presenting with WC, demonstrates decreased endurance with standing more than about 4 minutes. Patient able to progress dynamic balance and prosthetic activation exercises today with mirror for visual cues and decreased flexed trunk, good performance with verbal cueing. Patient would continue to benefit from skilled physical therapy for increased endurance with ambulation, increased LE strength, improved functional mobility and improved balance for improved quality of life, improved independence with gait training and continued progress towards therapy goals.     Patient is a 48 y.o. male who was seen today for physical therapy evaluation and treatment for Z89.612 (ICD-10-CM) - S/P AKA (above knee amputation), left (HCC). Patient demonstrates decreased strength, balance deficits and gait abnormalities which are negatively impacting patient ability to perform ADLs and functional mobility tasks. Patient will benefit from skilled physical therapy services to address these deficits to improve level of function with ADLs, functional mobility tasks, and reduce risk for falls.    OBJECTIVE IMPAIRMENTS: Abnormal gait, decreased activity tolerance, decreased balance, difficulty walking, decreased strength, obesity, and pain.   ACTIVITY LIMITATIONS: carrying, lifting, standing, squatting, stairs, transfers, bed mobility, and locomotion level  PARTICIPATION LIMITATIONS: meal prep, cleaning, shopping, community activity, and yard work  PERSONAL FACTORS: Time since onset of injury/illness/exacerbation and 1 comorbidity: anemia are also affecting patient's functional outcome.   REHAB POTENTIAL: Good  CLINICAL  DECISION MAKING: Evolving/moderate complexity  EVALUATION COMPLEXITY: Moderate   GOALS: Goals reviewed with patient? No  SHORT TERM GOALS: Target date: 05/24/2024 patient will be independent with initial HEP  Baseline: Goal status: INITIAL  2.  Patient will report 30% improvement overall  Baseline:  Goal status: INITIAL  3.  Patient will be able to don and doff prosthesis independently Baseline: needs mod assist Goal status: INITIAL  4. LONG TERM GOALS: Target date: 06/21/24  Patient will be independent in self management strategies to improve quality of life and functional outcomes.  Baseline:  Goal status: INITIAL  2.  Patient will report 50% improvement overall  Baseline:  Goal status: INITIAL  3.  Patient will improve LEFS score by 23 points to demonstrate improved perceived function  Baseline: 27/80 Goal status: INITIAL  4.  Patient will ambulate with LRAD and prosthesis x 200 ft in 2 minutes or less Baseline: unable Goal status: INITIAL  PLAN:  PT FREQUENCY: 2x/week  PT DURATION: 8 weeks  PLANNED INTERVENTIONS: [id number]- PT Re-evaluation, 97110-Therapeutic exercises, 97530- Therapeutic activity,  02887- Neuromuscular re-education, [phone]- Self Care, 02859- Manual therapy, [id number]- Gait training, [phone]- Orthotic Fit/training, [id number]- Canalith repositioning, [id number]- Aquatic Therapy, [id number]- Splinting, [id number]- Wound care (first 20 sq cm), 97598- Wound care (each additional 20 sq cm)Patient/Family education, Balance training, Stair training, Taping, Dry Needling, Joint mobilization, Joint manipulation, Spinal manipulation, Spinal mobilization, Scar mobilization, and DME instructions.   PLAN FOR NEXT SESSION: Give printout of HEP; prosthetic donning and doffing and locking/unlocking; gait training, balance, transfers, bed mobility; left lower extremity strengthening, consider gravity minimized LE strengthening positions especially for left hip extension.  Madelynn Malson,  PT, DPT [facility] Office: [phone] 1:00 PM, 06/14/24

## 2024-06-18 ENCOUNTER — Ambulatory Visit: Payer: Self-pay | Admitting: Nurse Practitioner

## 2024-06-18 ENCOUNTER — Encounter (HOSPITAL_COMMUNITY)

## 2024-06-19 ENCOUNTER — Encounter (HOSPITAL_COMMUNITY)

## 2024-06-21 ENCOUNTER — Ambulatory Visit: Admitting: "Endocrinology

## 2024-06-22 ENCOUNTER — Encounter (HOSPITAL_COMMUNITY): Payer: Self-pay

## 2024-06-22 ENCOUNTER — Ambulatory Visit (HOSPITAL_COMMUNITY)

## 2024-06-22 DIAGNOSIS — Z89612 Acquired absence of left leg above knee: Secondary | ICD-10-CM

## 2024-06-22 DIAGNOSIS — R2689 Other abnormalities of gait and mobility: Secondary | ICD-10-CM

## 2024-06-22 DIAGNOSIS — R2681 Unsteadiness on feet: Secondary | ICD-10-CM

## 2024-06-22 DIAGNOSIS — R262 Difficulty in walking, not elsewhere classified: Secondary | ICD-10-CM

## 2024-06-22 DIAGNOSIS — M6281 Muscle weakness (generalized): Secondary | ICD-10-CM

## 2024-06-22 NOTE — Therapy (Signed)
 OUTPATIENT PHYSICAL THERAPY LOWER EXTREMITY TREATMENT  Progress Note Reporting Period 04/26/24 to 06/22/24  See note below for Objective Data and Assessment of Progress/Goals.     Patient Name: Adam Long MRN: [id number] DOB:[birth year], 48 y.o., male Today's Date: 06/22/2024  END OF SESSION:  PT End of Session - 06/22/24 1247     Visit Number 7    Number of Visits 16    Date for Recertification  08/03/24    Authorization Type [place] Medicaid [employer]    Authorization Time Period seeking additional auth    Authorization - Visit Number 6    Authorization - Number of Visits 12    Progress Note Due on Visit 10    PT Start Time 1247    PT Stop Time 1327    PT Time Calculation (min) 40 min    Activity Tolerance Patient tolerated treatment well    Behavior During Therapy [facility] for tasks assessed/performed              Past Medical History:  Diagnosis Date   Anxiety    Aortic stenosis    Cellulitis and abscess of lower extremity 06/11/2019   Cellulitis of fourth toe of left foot    Cholelithiasis    Coronary artery disease    Nonobstructive CAD (40-50% LAD) 08/2019   Depression    Diabetes mellitus without complication (HCC)    Phreesia 09/27/2020   Elevated troponin level not due myocardial infarction 11/11/2019   Essential hypertension    Gangrene of toe of left foot (HCC) 07/06/2019   Heart murmur    Phreesia 09/27/2020   Hyperlipidemia    Phreesia 09/27/2020   Hypertension    Phreesia 09/27/2020   Mixed hyperlipidemia    Morbid obesity (HCC)    S/P aortic valve replacement with mechanical valve 12/05/2019   25 mm Carbomedics top hat bileaflet mechanical valve via partial upper hemi-sternotomy   Severe aortic stenosis 09/24/2019   Type 2 diabetes mellitus (HCC)    Past Surgical History:  Procedure Laterality Date   ABDOMINAL AORTOGRAM W/LOWER EXTREMITY N/A 07/06/2019   Procedure: ABDOMINAL AORTOGRAM W/LOWER EXTREMITY;  Surgeon: Harvey Carlin BRAVO, MD;   [facility]: [facility] INVASIVE CV LAB;  Service: Cardiovascular;  Laterality: N/A;  Bilateral   AMPUTATION Left 07/09/2019   Procedure: LEFT FOURTH and Fifth TOE AMPUTATION.;  Surgeon: Oris Krystal FALCON, MD;  Location: [facility] OR;  Service: Vascular;  Laterality: Left;   AMPUTATION Left 03/11/2021   Procedure: LEFT FOOT 5TH  AND 4TH RAY AMPUTATION;  Surgeon: Harden Jerona GAILS, MD;  Location: [facility] OR;  Service: Orthopedics;  Laterality: Left;   AMPUTATION Left 08/28/2021   Procedure: AMPUTATION BELOW KNEE;  Surgeon: Harden Jerona GAILS, MD;  Location: [facility] OR;  Service: Orthopedics;  Laterality: Left;   AMPUTATION Left 01/29/2023   Procedure: LEFT AMPUTATION BELOW KNEE REVISION AND CLOSURE;  Surgeon: Harden Jerona GAILS, MD;  Location: [facility] OR;  Service: Orthopedics;  Laterality: Left;   AMPUTATION Left 07/27/2023   Procedure: LEFT ABOVE KNEE AMPUTATION;  Surgeon: Harden Jerona GAILS, MD;  Location: [facility] OR;  Service: Orthopedics;  Laterality: Left;   AORTIC VALVE REPLACEMENT N/A 12/05/2019   Procedure: PARTIAL STERNOTOMY FOR AORTIC VALVE REPLACEMENT (AVR), USING CARBOMEDICS SUPRA-ANNULAR TOP HAT ;  Surgeon: Dusty Sudie DEL, MD;  Location: [facility] OR;  Service: Open Heart Surgery;  Laterality: N/A;  No neck lines on left   APPLICATION OF WOUND VAC Left 10/30/2021   Procedure: APPLICATION OF WOUND VAC;  Surgeon: Harden Jerona  V, MD;  Location: [facility] OR;  Service: Orthopedics;  Laterality: Left;   APPLICATION OF WOUND VAC Left 12/25/2021   Procedure: APPLICATION OF WOUND VAC;  Surgeon: Harden Jerona GAILS, MD;  Location: [facility] OR;  Service: Orthopedics;  Laterality: Left;   APPLICATION OF WOUND VAC Left 01/26/2023   Procedure: APPLICATION OF WOUND VAC TO LEFT STUMP;  Surgeon: Jerri Kay HERO, MD;  Location: [facility] OR;  Service: Orthopedics;  Laterality: Left;   CARDIAC VALVE REPLACEMENT N/A    Phreesia 09/27/2020   I & D EXTREMITY Left 01/26/2023   Procedure: IRRIGATION AND DEBRIDEMENT OF LEFT BKA STUMP;  Surgeon: Jerri Kay HERO, MD;  Location: [facility] OR;  Service:  Orthopedics;  Laterality: Left;   IR RADIOLOGY PERIPHERAL GUIDED IV START  10/05/2019   IR US  GUIDE VASC ACCESS RIGHT  10/05/2019   MULTIPLE EXTRACTIONS WITH ALVEOLOPLASTY N/A 10/26/2019   Procedure: EXTRACTION OF TOOTH #'S 3, 5-11,19-28,  AND 32 WITH ALVEOLOPLASTY;  Surgeon: Cyndee Tanda FALCON, DDS;  Location: [facility] OR;  Service: Oral Surgery;  Laterality: N/A;   RIGHT HEART CATH AND CORONARY ANGIOGRAPHY N/A 09/24/2019   Procedure: RIGHT HEART CATH AND CORONARY ANGIOGRAPHY;  Surgeon: Wonda Sharper, MD;  Location: [facility] INVASIVE CV LAB;  Service: Cardiovascular;  Laterality: N/A;   STUMP REVISION Left 10/30/2021   Procedure: REVISION LEFT BELOW KNEE AMPUTATION;  Surgeon: Harden Jerona GAILS, MD;  Location: [facility] OR;  Service: Orthopedics;  Laterality: Left;   STUMP REVISION Left 12/25/2021   Procedure: REVISION LEFT BELOW KNEE AMPUTATION;  Surgeon: Harden Jerona GAILS, MD;  Location: [facility] OR;  Service: Orthopedics;  Laterality: Left;   TEE WITHOUT CARDIOVERSION N/A 12/05/2019   Procedure: TRANSESOPHAGEAL ECHOCARDIOGRAM (TEE);  Surgeon: Dusty Sudie DEL, MD;  Location: [facility] OR;  Service: Open Heart Surgery;  Laterality: N/A;   TEE WITHOUT CARDIOVERSION N/A 09/01/2021   Procedure: TRANSESOPHAGEAL ECHOCARDIOGRAM (TEE);  Surgeon: Kate Lonni CROME, MD;  Location: [facility] ENDOSCOPY;  Service: Cardiovascular;  Laterality: N/A;   Patient Active Problem List   Diagnosis Date Noted   Vitamin B12 deficiency 02/15/2024   Iron deficiency anemia 12/22/2023   S/P AKA (above knee amputation), left (HCC) 07/31/2023   Hepatic steatosis 03/25/2023   GERD (gastroesophageal reflux disease) 01/24/2023   History of osteomyelitis 10/12/2022   Uncontrolled type 2 diabetes mellitus with hyperglycemia (HCC) 10/12/2022   CAD (coronary artery disease) 10/12/2022   Encounter for therapeutic drug monitoring 10/12/2022   Anxiety 09/30/2020   S/P aortic valve replacement with mechanical valve 12/05/2019   Hyperlipidemia associated with type 2  diabetes mellitus (HCC) 10/12/2015   Class 3 obesity 10/12/2015   HTN (hypertension) 07/02/2013    PCP: Bluford Jacqulyn MATSU, DO  REFERRING PROVIDER: Valdemar Rocky SAUNDERS, NP  REFERRING DIAG: [phone] (ICD-10-CM) - S/P AKA (above knee amputation), left (HCC)  THERAPY DIAG:  S/P AKA (above knee amputation) unilateral, left (HCC)  Difficulty in walking, not elsewhere classified  Other abnormalities of gait and mobility  Unsteadiness on feet  Muscle weakness (generalized)  Rationale for Evaluation and Treatment: Rehabilitation  ONSET DATE: 07/27/23 s/p  SUBJECTIVE:   SUBJECTIVE STATEMENT: Pt states he had a fall Monday night and was on the ground for about 5.5 hours. Pt states he didn't want to call 911 so waited on neighbors to check text. Carpet burn on right knee from trying to get up by himself. Pt also broke big toe nail off pt states he has been keeping it clean with help of cousin. Pt still waiting to hear  back from heart appointment. Pt states colonoscopy is on hold until heart results are back. Pt states he has not eaten anything today due to inability to not trust bowel control.       S/p 07/27/23 left AKA.  Received prosthesis last month. History of multiple surgeries on the left lower extremity.   Previously had a below knee amputation and managed well but this prosthesis is quite a bit more challenging.  Has been using a WC primarily for mobility and uses a walker to transfer (RW).  [employer] is prosthetist.  Arrives in [facility] with family cousins who assist as needed.    PERTINENT HISTORY: Anemia; getting iron infusions Has artificial heart valve. PAIN:  Are you having pain? Yes: NPRS scale: 0 Pain location: phantom pain left leg; right knee sometimes Pain description: nerve pain, right knee aching Aggravating factors: right knee when being up on it Relieving factors: Gabapentin , Tylenol   PRECAUTIONS: None  RED FLAGS: None   WEIGHT BEARING RESTRICTIONS: No  FALLS:  Has  patient fallen in last 6 months? Yes. Number of falls 1  LIVING ENVIRONMENT: Lives with: lives with their family Lives in: House/apartment Stairs: No ramp entry Has following equipment at home: Vannie - 2 wheeled, Wheelchair (power), Wheelchair (manual), shower chair, bed side commode, and Ramped entry bed cane  OCCUPATION: not working; [employer] before  PLOF: Independent with household mobility with device  PATIENT GOALS: want to walk on 2 feet; being able to work some  NEXT MD VISIT: hematologist in August  OBJECTIVE:  Note: Objective measures were completed at Evaluation unless otherwise noted.  DIAGNOSTIC FINDINGS:   PATIENT SURVEYS:  LEFS 27/80 33.8% 06/22/24: 13 / 80 = 16.3 % Extreme difficulty/unable (0), Quite a bit of difficulty (1), Moderate difficulty (2), Little difficulty (3), No difficulty (4) Survey date:    Any of your usual work, housework or school activities   2. Usual hobbies, recreational or sporting activities   3. Getting into/out of the bath   4. Walking between rooms   5. Putting on socks/shoes   6. Squatting    7. Lifting an object, like a bag of groceries from the floor   8. Performing light activities around your home   9. Performing heavy activities around your home   10. Getting into/out of a car   11. Walking 2 blocks   12. Walking 1 mile   13. Going up/down 10 stairs (1 flight)   14. Standing for 1 hour   15.  sitting for 1 hour   16. Running on even ground   17. Running on uneven ground   18. Making sharp turns while running fast   19. Hopping    20. Rolling over in bed   Score total:  27/80     COGNITION: Overall cognitive status: Within functional limits for tasks assessed     SENSATION: Not tested  EDEMA:  None noted  POSTURE: rounded shoulders, forward head, flexed trunk , and weight shift right  PALPATION: No significant tenderness noted  LOWER EXTREMITY ROM:  Active ROM Right eval Left eval  Hip flexion     Hip extension    Hip abduction    Hip adduction    Hip internal rotation    Hip external rotation    Knee flexion    Knee extension    Ankle dorsiflexion    Ankle plantarflexion    Ankle inversion    Ankle eversion     (Blank rows =  not tested)  LOWER EXTREMITY MMT:  MMT Right eval Left eval  Hip flexion    Hip extension    Hip abduction    Hip adduction    Hip internal rotation    Hip external rotation    Knee flexion    Knee extension    Ankle dorsiflexion    Ankle plantarflexion    Ankle inversion    Ankle eversion     (Blank rows = not tested)   FUNCTIONAL TESTS:  30 seconds chair stand test x 1 with min A for safety to RW  GAIT: Distance walked: 0 ft Assistive device utilized: Environmental consultant - 2 wheeled Level of assistance: Min A Comments: patient unable to get left prosthesis to lock out to take a step                                                                                                                                TREATMENT DATE:  06/22/2024  Therapeutic Exercise: -SLR, 1 set of 10 reps, pt cued for eccentric control and max concentric control -Hip abductions, 1 set of 10 reps, pt cued for eccentric control and max concentric control Therapeutic Activity: -Mat table to wheelchair transfer, x2, pt able to complete transfer independently with RW and no prosthesis. Pt educated on the importance of controlled descent to table and chair Self care: -educated on importance of bed mobility HEP, care of BLE for diabetes and recent injuries, education on importance of following up with heart and colonoscopy doctors to figure out GI issues as pt is not obtaining sustainable nutrition.   06/14/2024  Neuromuscular Re-education: -Weight shifting on and off unlocked prosthetic leg, 2 sets of 10 reps in RW, pt cued for increased left hip extension, BUE support required -Cone touches from 2 inch step, with LLE with lock out with hip extension, 2 sets of 6 reps pt  cued for upright posture and increased hip extension, BUE support required Therapeutic Activity: -Mat table to wheelchair transfer, x1, pt require WC on right side to complete stand pivot transfer from elevated mat table. Pt educated on the importance of reaching back for chair arm for safety with stand to sit transfer Gait Training: -Ambulation trial with personal RW with locked prosthetic, 1 bout of  about 12 feet to parallel bars, pt requires mod therapist assist with ambulation, pt cued for foot placement as pt continues to demonstrate decreased balance and narrow BOS  06/07/2024  Neuromuscular Re-education: -Weight shifting on and off prosthetic leg, 2 sets of 10 reps in RW, pt cued for increased left hip extension  Therapeutic Activity: -Mat table to wheelchair transfer, x1, pt require WC on right side to complete stand pivot transfer from elevated mat table. Pt educated on the importance of reaching back for chair arm for safety with stand to sit transfer Gait Training: -Ambulation trial with personal RW with locked prosthetic, five bouts of  about 12 feet to  and from hallway, pt requires mod therapist assist with ambulation, pt cued for foot placement as pt continues to demonstrate decreased balance and narrow BOS   PATIENT EDUCATION:  Education details: Patient educated on exam findings, POC, scope of PT, HEP, and discussion regarding calling prosthetist for adjustments. Person educated: Patient Education method: Explanation, Demonstration, and Handouts Education comprehension: verbalized understanding, returned demonstration, verbal cues required, and tactile cues required   HOME EXERCISE PROGRAM: Access Code: [email or web address] URL: [email or web address] Date: 04/26/2024 Prepared by: AP - Rehab  Exercises - Standing Single Leg Stance with Counter Support  - 2 x daily - 7 x weekly - 1 sets - 2 reps - Lying Prone  - 2 x daily - 7 x weekly - 1 sets - 1 reps - 5 min hold -  Sidelying Hip Abduction (AKA)  - 2 x daily - 7 x weekly - 2 sets - 10 reps - Prone Hip Extension with Residual Limb (BKA)  - 2 x daily - 7 x weekly - 2 sets - 10 reps   Access Code: [email or web address] URL: [email or web address] Date: 06/22/2024 Prepared by: Lang Ada  Exercises - Sidelying Hip Abduction  - 1 x daily - 7 x weekly - 3 sets - 10 reps - Supine Active Straight Leg Raise  - 1 x daily - 7 x weekly - 3 sets - 10 reps - Prone Hip Extension  - 1 x daily - 7 x weekly - 3 sets - 10 reps ASSESSMENT:  CLINICAL IMPRESSION: Patient continues to demonstrate decreased LE strength, decreased gait quality, functional mobility and balance. Patient also demonstrates visible would on R knee from rub on carpet from fall and pt has big toe wrapped after breaking big toe nail off during fall. Reviewed with pt bed mobility and exercise regiment and asked to complete everyday until next session. Pt has only met one out of seven goals to this point. Patient would benefit from additional 6 weeks of skilled physical therapy for increased independence with management of functional mobility, prosthesis donning/doffing, endurance with ambulation, increased LE strength, and improved balance for improved quality of life, improved independence with gait training and continued progress towards therapy goals.      Patient is a 48 y.o. male who was seen today for physical therapy evaluation and treatment for Z89.612 (ICD-10-CM) - S/P AKA (above knee amputation), left (HCC). Patient demonstrates decreased strength, balance deficits and gait abnormalities which are negatively impacting patient ability to perform ADLs and functional mobility tasks. Patient will benefit from skilled physical therapy services to address these deficits to improve level of function with ADLs, functional mobility tasks, and reduce risk for falls.    OBJECTIVE IMPAIRMENTS: Abnormal gait, decreased activity tolerance, decreased  balance, difficulty walking, decreased strength, obesity, and pain.   ACTIVITY LIMITATIONS: carrying, lifting, standing, squatting, stairs, transfers, bed mobility, and locomotion level  PARTICIPATION LIMITATIONS: meal prep, cleaning, shopping, community activity, and yard work  PERSONAL FACTORS: Time since onset of injury/illness/exacerbation and 1 comorbidity: anemia are also affecting patient's functional outcome.   REHAB POTENTIAL: Good  CLINICAL DECISION MAKING: Evolving/moderate complexity  EVALUATION COMPLEXITY: Moderate   GOALS: Goals reviewed with patient? No  SHORT TERM GOALS: Target date: 05/24/2024 patient will be independent with initial HEP  Baseline: Goal status: Progressing  2.  Patient will report 30% improvement overall  Baseline:  Goal status: Progressing  3.  Patient will be able to don and doff prosthesis independently Baseline: needs mod assist Goal status: Progressing   LONG TERM  GOALS: Target date: 06/21/24  Patient will be independent in self management strategies to improve quality of life and functional outcomes.  Baseline:  Goal status: Partially Met  2.  Patient will report 50% improvement overall  Baseline:  Goal status: Progressing  3.  Patient will improve LEFS score by 23 points to demonstrate improved perceived function  Baseline: 27/80 Goal status: Progressing  4.  Patient will ambulate with LRAD and prosthesis x 200 ft in 2 minutes or less Baseline: unable Goal status: Progressing  PLAN:  PT FREQUENCY: 2x/week  PT DURATION: 6 weeks  PLANNED INTERVENTIONS: [id number]- PT Re-evaluation, 97110-Therapeutic exercises, 97530- Therapeutic activity, 97112- Neuromuscular re-education, 97535- Self Care, 02859- Manual therapy, [id number]- Gait training, [phone]- Orthotic Fit/training, [phone]- Canalith repositioning, [id number]- Aquatic Therapy, 97760- Splinting, [phone]- Wound care (first 20 sq cm), 97598- Wound care (each additional 20 sq  cm)Patient/Family education, Balance training, Stair training, Taping, Dry Needling, Joint mobilization, Joint manipulation, Spinal manipulation, Spinal mobilization, Scar mobilization, and DME instructions.   PLAN FOR NEXT SESSION: prosthetic donning and doffing and locking/unlocking; gait training, balance, transfers, bed mobility; left lower extremity strengthening, consider gravity minimized LE strengthening positions especially for left hip extension. Extend for 2 visits per week for 6 weeks.  Lang Ada, PT, DPT [facility] Office: [phone] 6:33 PM, 06/22/24     Managed Medicaid Authorization Request Treatment Start Date: 01-May-2024   Visit Dx Codes: S10.387, R26.2, R26.89   Functional Tool Score: LEFS 06/22/24: 13 / 80 = 16.3 %   For all possible CPT codes, reference the Planned Interventions line above.                       Check all conditions that are expected to impact treatment: {Conditions expected to impact treatment:Morbid obesity      If treatment provided at initial evaluation, no treatment charged due to lack of authorization.

## 2024-06-25 ENCOUNTER — Telehealth (HOSPITAL_COMMUNITY): Payer: Self-pay

## 2024-06-25 ENCOUNTER — Encounter (HOSPITAL_COMMUNITY)

## 2024-06-25 NOTE — Telephone Encounter (Signed)
 Phone went straight to voicemail. Left a message regarding missed appointment today and reminder of upcoming appointment on 10/3.  1:11 PM, 06/25/24 Mone Commisso Small Trulee Hamstra MPT [facility] [place] [phone] Ph:[phone]

## 2024-06-25 NOTE — Telephone Encounter (Signed)
 Spoke with patient's cousin regarding no show after trying to reach patient and she states she tried to call earlier today and cancel as they just could not get here at the scheduled time today so she tried to cancel but could not get through; reminded her of patients appt on 10/3.  1:16 PM, 06/25/24 Jmichael Gille Small Jaely Silman MPT [facility] [place] [phone] [phone]

## 2024-06-26 ENCOUNTER — Ambulatory Visit: Attending: Cardiology | Admitting: *Deleted

## 2024-06-26 DIAGNOSIS — Z954 Presence of other heart-valve replacement: Secondary | ICD-10-CM | POA: Insufficient documentation

## 2024-06-26 DIAGNOSIS — Z5181 Encounter for therapeutic drug level monitoring: Secondary | ICD-10-CM | POA: Insufficient documentation

## 2024-06-26 LAB — POCT INR: INR: 2 (ref 2.0–3.0)

## 2024-06-26 NOTE — Progress Notes (Signed)
 INR 2.0 Please see anticoagulation encounter.

## 2024-06-26 NOTE — Patient Instructions (Signed)
Continue warfarin 1 tablet daily expcet for 1/2 tablet on Monday, Wednesday and Friday. Recheck in 6 wks.

## 2024-06-28 ENCOUNTER — Ambulatory Visit: Admitting: Nurse Practitioner

## 2024-06-29 ENCOUNTER — Encounter (HOSPITAL_COMMUNITY): Payer: Self-pay

## 2024-06-29 ENCOUNTER — Ambulatory Visit (HOSPITAL_COMMUNITY): Attending: Family

## 2024-06-29 DIAGNOSIS — Z89612 Acquired absence of left leg above knee: Secondary | ICD-10-CM | POA: Diagnosis present

## 2024-06-29 DIAGNOSIS — R2681 Unsteadiness on feet: Secondary | ICD-10-CM | POA: Insufficient documentation

## 2024-06-29 DIAGNOSIS — R262 Difficulty in walking, not elsewhere classified: Secondary | ICD-10-CM | POA: Diagnosis present

## 2024-06-29 DIAGNOSIS — R2689 Other abnormalities of gait and mobility: Secondary | ICD-10-CM | POA: Diagnosis present

## 2024-06-29 DIAGNOSIS — M6281 Muscle weakness (generalized): Secondary | ICD-10-CM | POA: Insufficient documentation

## 2024-06-29 NOTE — Therapy (Signed)
 OUTPATIENT PHYSICAL THERAPY LOWER EXTREMITY TREATMENT    Patient Name: Adam Long MRN: [id number] DOB:October 24, 1975, 48 y.o., male Today's Date: 06/29/2024  END OF SESSION:  PT End of Session - 06/29/24 1251     Visit Number 8    Number of Visits 16    Date for Recertification  08/03/24    Authorization Type [place] [employer]    Authorization Time Period seeking additional auth    Authorization - Visit Number 7    Authorization - Number of Visits 12    Progress Note Due on Visit 12    PT Start Time 1251    PT Stop Time 1325    PT Time Calculation (min) 34 min    Activity Tolerance Patient tolerated treatment well;Patient limited by fatigue    Behavior During Therapy [facility] for tasks assessed/performed               Past Medical History:  Diagnosis Date   Anxiety    Aortic stenosis    Cellulitis and abscess of lower extremity 06/11/2019   Cellulitis of fourth toe of left foot    Cholelithiasis    Coronary artery disease    Nonobstructive CAD (40-50% LAD) 08/2019   Depression    Diabetes mellitus without complication (HCC)    Phreesia 09/27/2020   Elevated troponin level not due myocardial infarction 11/11/2019   Essential hypertension    Gangrene of toe of left foot (HCC) 07/06/2019   Heart murmur    Phreesia 09/27/2020   Hyperlipidemia    Phreesia 09/27/2020   Hypertension    Phreesia 09/27/2020   Mixed hyperlipidemia    Morbid obesity (HCC)    S/P aortic valve replacement with mechanical valve 12/05/2019   25 mm Carbomedics top hat bileaflet mechanical valve via partial upper hemi-sternotomy   Severe aortic stenosis 09/24/2019   Type 2 diabetes mellitus (HCC)    Past Surgical History:  Procedure Laterality Date   ABDOMINAL AORTOGRAM W/LOWER EXTREMITY N/A 07/06/2019   Procedure: ABDOMINAL AORTOGRAM W/LOWER EXTREMITY;  Surgeon: Harvey Carlin BRAVO, MD;  Location: [facility] INVASIVE CV LAB;  Service: Cardiovascular;  Laterality: N/A;  Bilateral   AMPUTATION  Left 07/09/2019   Procedure: LEFT FOURTH and Fifth TOE AMPUTATION.;  Surgeon: Oris Krystal FALCON, MD;  Location: [facility] OR;  Service: Vascular;  Laterality: Left;   AMPUTATION Left 03/11/2021   Procedure: LEFT FOOT 5TH  AND 4TH RAY AMPUTATION;  Surgeon: Harden Jerona GAILS, MD;  Location: [facility] OR;  Service: Orthopedics;  Laterality: Left;   AMPUTATION Left 08/28/2021   Procedure: AMPUTATION BELOW KNEE;  Surgeon: Harden Jerona GAILS, MD;  Location: [facility] OR;  Service: Orthopedics;  Laterality: Left;   AMPUTATION Left 01/29/2023   Procedure: LEFT AMPUTATION BELOW KNEE REVISION AND CLOSURE;  Surgeon: Harden Jerona GAILS, MD;  Location: [facility] OR;  Service: Orthopedics;  Laterality: Left;   AMPUTATION Left 07/27/2023   Procedure: LEFT ABOVE KNEE AMPUTATION;  Surgeon: Harden Jerona GAILS, MD;  Location: [facility] OR;  Service: Orthopedics;  Laterality: Left;   AORTIC VALVE REPLACEMENT N/A 12/05/2019   Procedure: PARTIAL STERNOTOMY FOR AORTIC VALVE REPLACEMENT (AVR), USING CARBOMEDICS SUPRA-ANNULAR TOP HAT ;  Surgeon: Dusty Sudie DEL, MD;  Location: [facility] OR;  Service: Open Heart Surgery;  Laterality: N/A;  No neck lines on left   APPLICATION OF WOUND VAC Left 10/30/2021   Procedure: APPLICATION OF WOUND VAC;  Surgeon: Harden Jerona GAILS, MD;  Location: [facility] OR;  Service: Orthopedics;  Laterality: Left;   APPLICATION OF  WOUND VAC Left 12/25/2021   Procedure: APPLICATION OF WOUND VAC;  Surgeon: Harden Jerona GAILS, MD;  Location: [facility] OR;  Service: Orthopedics;  Laterality: Left;   APPLICATION OF WOUND VAC Left 01/26/2023   Procedure: APPLICATION OF WOUND VAC TO LEFT STUMP;  Surgeon: Jerri Kay HERO, MD;  Location: [facility] OR;  Service: Orthopedics;  Laterality: Left;   CARDIAC VALVE REPLACEMENT N/A    Phreesia 09/27/2020   I & D EXTREMITY Left 01/26/2023   Procedure: IRRIGATION AND DEBRIDEMENT OF LEFT BKA STUMP;  Surgeon: Jerri Kay HERO, MD;  Location: [facility] OR;  Service: Orthopedics;  Laterality: Left;   IR RADIOLOGY PERIPHERAL GUIDED IV START  10/05/2019   IR US  GUIDE VASC  ACCESS RIGHT  10/05/2019   MULTIPLE EXTRACTIONS WITH ALVEOLOPLASTY N/A 10/26/2019   Procedure: EXTRACTION OF TOOTH #'S 3, 5-11,19-28,  AND 32 WITH ALVEOLOPLASTY;  Surgeon: Cyndee Tanda FALCON, DDS;  Location: [facility] OR;  Service: Oral Surgery;  Laterality: N/A;   RIGHT HEART CATH AND CORONARY ANGIOGRAPHY N/A 09/24/2019   Procedure: RIGHT HEART CATH AND CORONARY ANGIOGRAPHY;  Surgeon: Wonda Sharper, MD;  Location: [facility] INVASIVE CV LAB;  Service: Cardiovascular;  Laterality: N/A;   STUMP REVISION Left 10/30/2021   Procedure: REVISION LEFT BELOW KNEE AMPUTATION;  Surgeon: Harden Jerona GAILS, MD;  Location: [facility] OR;  Service: Orthopedics;  Laterality: Left;   STUMP REVISION Left 12/25/2021   Procedure: REVISION LEFT BELOW KNEE AMPUTATION;  Surgeon: Harden Jerona GAILS, MD;  Location: [facility] OR;  Service: Orthopedics;  Laterality: Left;   TEE WITHOUT CARDIOVERSION N/A 12/05/2019   Procedure: TRANSESOPHAGEAL ECHOCARDIOGRAM (TEE);  Surgeon: Dusty Sudie DEL, MD;  Location: [facility] OR;  Service: Open Heart Surgery;  Laterality: N/A;   TEE WITHOUT CARDIOVERSION N/A 09/01/2021   Procedure: TRANSESOPHAGEAL ECHOCARDIOGRAM (TEE);  Surgeon: Kate Lonni CROME, MD;  Location: [facility] ENDOSCOPY;  Service: Cardiovascular;  Laterality: N/A;   Patient Active Problem List   Diagnosis Date Noted   Vitamin B12 deficiency 02/15/2024   Iron deficiency anemia 12/22/2023   S/P AKA (above knee amputation), left (HCC) 07/31/2023   Hepatic steatosis 03/25/2023   GERD (gastroesophageal reflux disease) 01/24/2023   History of osteomyelitis 10/12/2022   Uncontrolled type 2 diabetes mellitus with hyperglycemia (HCC) 10/12/2022   CAD (coronary artery disease) 10/12/2022   Encounter for therapeutic drug monitoring 10/12/2022   Anxiety 09/30/2020   S/P aortic valve replacement with mechanical valve 12/05/2019   Hyperlipidemia associated with type 2 diabetes mellitus (HCC) 10/12/2015   Class 3 obesity (HCC) 10/12/2015   HTN (hypertension) 07/02/2013     PCP: Bluford Jacqulyn MATSU, DO  REFERRING PROVIDER: Valdemar Rocky SAUNDERS, NP  REFERRING DIAG: [phone] (ICD-10-CM) - S/P AKA (above knee amputation), left (HCC)  THERAPY DIAG:  S/P AKA (above knee amputation) unilateral, left (HCC)  Difficulty in walking, not elsewhere classified  Other abnormalities of gait and mobility  Unsteadiness on feet  Muscle weakness (generalized)  Rationale for Evaluation and Treatment: Rehabilitation  ONSET DATE: 07/27/23 s/p  SUBJECTIVE:   SUBJECTIVE STATEMENT: Pt states he had a fall Monday night and was on the ground for about 5.5 hours. Pt states he didn't want to call 911 so waited on neighbors to check text. Carpet burn on right knee from trying to get up by himself. Pt also broke big toe nail off pt states he has been keeping it clean with help of cousin. Pt still waiting to hear back from heart appointment. Pt states colonoscopy is on hold until heart results are back.  Pt states he has not eaten anything today due to inability to not trust bowel control.       S/p 07/27/23 left AKA.  Received prosthesis last month. History of multiple surgeries on the left lower extremity.   Previously had a below knee amputation and managed well but this prosthesis is quite a bit more challenging.  Has been using a WC primarily for mobility and uses a walker to transfer (RW).  [employer] is prosthetist.  Arrives in [facility] with family cousins who assist as needed.    PERTINENT HISTORY: Anemia; getting iron infusions Has artificial heart valve. PAIN:  Are you having pain? Yes: NPRS scale: 0 Pain location: phantom pain left leg; right knee sometimes Pain description: nerve pain, right knee aching Aggravating factors: right knee when being up on it Relieving factors: Gabapentin , Tylenol   PRECAUTIONS: None  RED FLAGS: None   WEIGHT BEARING RESTRICTIONS: No  FALLS:  Has patient fallen in last 6 months? Yes. Number of falls 1  LIVING ENVIRONMENT: Lives with: lives with  their family Lives in: House/apartment Stairs: No ramp entry Has following equipment at home: Vannie - 2 wheeled, Wheelchair (power), Wheelchair (manual), shower chair, bed side commode, and Ramped entry bed cane  OCCUPATION: not working; [employer] before  PLOF: Independent with household mobility with device  PATIENT GOALS: want to walk on 2 feet; being able to work some  NEXT MD VISIT: hematologist in August  OBJECTIVE:  Note: Objective measures were completed at Evaluation unless otherwise noted.  DIAGNOSTIC FINDINGS:   PATIENT SURVEYS:  LEFS 27/80 33.8% 06/22/24: 13 / 80 = 16.3 % Extreme difficulty/unable (0), Quite a bit of difficulty (1), Moderate difficulty (2), Little difficulty (3), No difficulty (4) Survey date:    Any of your usual work, housework or school activities   2. Usual hobbies, recreational or sporting activities   3. Getting into/out of the bath   4. Walking between rooms   5. Putting on socks/shoes   6. Squatting    7. Lifting an object, like a bag of groceries from the floor   8. Performing light activities around your home   9. Performing heavy activities around your home   10. Getting into/out of a car   11. Walking 2 blocks   12. Walking 1 mile   13. Going up/down 10 stairs (1 flight)   14. Standing for 1 hour   15.  sitting for 1 hour   16. Running on even ground   17. Running on uneven ground   18. Making sharp turns while running fast   19. Hopping    20. Rolling over in bed   Score total:  27/80     COGNITION: Overall cognitive status: Within functional limits for tasks assessed     SENSATION: Not tested  EDEMA:  None noted  POSTURE: rounded shoulders, forward head, flexed trunk , and weight shift right  PALPATION: No significant tenderness noted  LOWER EXTREMITY ROM:  Active ROM Right eval Left eval  Hip flexion    Hip extension    Hip abduction    Hip adduction    Hip internal rotation    Hip external rotation     Knee flexion    Knee extension    Ankle dorsiflexion    Ankle plantarflexion    Ankle inversion    Ankle eversion     (Blank rows = not tested)  LOWER EXTREMITY MMT:  MMT Right eval Left eval  Hip flexion  Hip extension    Hip abduction    Hip adduction    Hip internal rotation    Hip external rotation    Knee flexion    Knee extension    Ankle dorsiflexion    Ankle plantarflexion    Ankle inversion    Ankle eversion     (Blank rows = not tested)   FUNCTIONAL TESTS:  30 seconds chair stand test x 1 with min A for safety to RW  GAIT: Distance walked: 0 ft Assistive device utilized: Environmental consultant - 2 wheeled Level of assistance: Min A Comments: patient unable to get left prosthesis to lock out to take a step                                                                                                                                TREATMENT DATE:  06/29/2024  174/98 mm/Hg un medicated 97 BPM Neuromuscular Re-education: -Step up and overs, 1 set of 3 reps, 6 inch step, weight shift each way down, pt cued for deep breath and standing upright, min assist provided -Lateral step up and overs, 1 set of 3 reps, 4 inch step, pt requires mod assist for this, cued for increased step clearance Therapeutic Activity: -Mat table to wheelchair transfer, x2, pt require WC on right side to complete stand pivot transfer from elevated mat table. Pt educated on the importance of reaching back for chair arm for safety with stand to sit transfer -Standing hip abductions, 1 set of 8 reps, LLE only, pt cued for max ROM Gait Training: -Ambulation trial with personal RW with locked prosthetic, 2 bouts of  about 12 feet to parallel bars, pt requires mod therapist assist with ambulation, pt cued for foot placement as pt demonstrates improved coordination and more appropriate step length with prosthesis.   06/14/2024  Neuromuscular Re-education: -Weight shifting on and off unlocked prosthetic  leg, 2 sets of 10 reps in RW, pt cued for increased left hip extension, BUE support required -Cone touches from 2 inch step, with LLE with lock out with hip extension, 2 sets of 6 reps pt cued for upright posture and increased hip extension, BUE support required Therapeutic Activity: -Mat table to wheelchair transfer, x1, pt require WC on right side to complete stand pivot transfer from elevated mat table. Pt educated on the importance of reaching back for chair arm for safety with stand to sit transfer Gait Training: -Ambulation trial with personal RW with locked prosthetic, 1 bout of  about 12 feet to parallel bars, pt requires mod therapist assist with ambulation, pt cued for foot placement as pt continues to demonstrate decreased balance and narrow BOS  PATIENT EDUCATION:  Education details: Patient educated on exam findings, POC, scope of PT, HEP, and discussion regarding calling prosthetist for adjustments. Person educated: Patient Education method: Explanation, Demonstration, and Handouts Education comprehension: verbalized understanding, returned demonstration, verbal cues required, and tactile cues required   HOME  EXERCISE PROGRAM: Access Code: [email or web address] URL: [email or web address] Date: 04/26/2024 Prepared by: AP - Rehab  Exercises - Standing Single Leg Stance with Counter Support  - 2 x daily - 7 x weekly - 1 sets - 2 reps - Lying Prone  - 2 x daily - 7 x weekly - 1 sets - 1 reps - 5 min hold - Sidelying Hip Abduction (AKA)  - 2 x daily - 7 x weekly - 2 sets - 10 reps - Prone Hip Extension with Residual Limb (BKA)  - 2 x daily - 7 x weekly - 2 sets - 10 reps   Access Code: [email or web address] URL: [email or web address] Date: 06/22/2024 Prepared by: Lang Ada  Exercises - Sidelying Hip Abduction  - 1 x daily - 7 x weekly - 3 sets - 10 reps - Supine Active Straight Leg Raise  - 1 x daily - 7 x weekly - 3 sets - 10 reps - Prone Hip Extension  - 1 x daily -  7 x weekly - 3 sets - 10 reps ASSESSMENT:  CLINICAL IMPRESSION: Patient continues to demonstrate decreased LLE strength, decreased gait quality and balance. Patient also demonstrates decreased endurance with aerobic based exercise during today's session, SOB noted following prolonged standing and multiple sitting breaks required. Patient able to progress dynamic balance and prolonged standing endurance exercises today with step up variations good performance with verbal cueing. Pt demonstrated ablitiy to stand for 4 minutes with BUE support, a few bouts with just one UE support and about 1 second without any UE support. Pt requires education to drive to GI's office as he is continuing to wait on a call back from them. Pt is hesitant to take BP and other medications as he does not want to eat due to bowel issues, elevated BP reading this date. Patient would continue to benefit from skilled physical therapy for increased endurance with ambulation, increased LLE strength, and improved balance for improved quality of life, improved independence with gait training and continued progress towards therapy goals.       Patient is a 48 y.o. male who was seen today for physical therapy evaluation and treatment for Z89.612 (ICD-10-CM) - S/P AKA (above knee amputation), left (HCC). Patient demonstrates decreased strength, balance deficits and gait abnormalities which are negatively impacting patient ability to perform ADLs and functional mobility tasks. Patient will benefit from skilled physical therapy services to address these deficits to improve level of function with ADLs, functional mobility tasks, and reduce risk for falls.    OBJECTIVE IMPAIRMENTS: Abnormal gait, decreased activity tolerance, decreased balance, difficulty walking, decreased strength, obesity, and pain.   ACTIVITY LIMITATIONS: carrying, lifting, standing, squatting, stairs, transfers, bed mobility, and locomotion level  PARTICIPATION  LIMITATIONS: meal prep, cleaning, shopping, community activity, and yard work  PERSONAL FACTORS: Time since onset of injury/illness/exacerbation and 1 comorbidity: anemia are also affecting patient's functional outcome.   REHAB POTENTIAL: Good  CLINICAL DECISION MAKING: Evolving/moderate complexity  EVALUATION COMPLEXITY: Moderate   GOALS: Goals reviewed with patient? No  SHORT TERM GOALS: Target date: 05/24/2024 patient will be independent with initial HEP  Baseline: Goal status: Progressing  2.  Patient will report 30% improvement overall  Baseline:  Goal status: Progressing  3.  Patient will be able to don and doff prosthesis independently Baseline: needs mod assist Goal status: Progressing   LONG TERM GOALS: Target date: 06/21/24  Patient will be independent in self management strategies to improve quality of life and functional outcomes.  Baseline:  Goal status:  Partially Met  2.  Patient will report 50% improvement overall  Baseline:  Goal status: Progressing  3.  Patient will improve LEFS score by 23 points to demonstrate improved perceived function  Baseline: 27/80 Goal status: Progressing  4.  Patient will ambulate with LRAD and prosthesis x 200 ft in 2 minutes or less Baseline: unable Goal status: Progressing  PLAN:  PT FREQUENCY: 2x/week  PT DURATION: 6 weeks  PLANNED INTERVENTIONS: [id number]- PT Re-evaluation, 97110-Therapeutic exercises, 97530- Therapeutic activity, 97112- Neuromuscular re-education, 97535- Self Care, [id number]- Manual therapy, [id number]- Gait training, [phone]- Orthotic Fit/training, [phone]- Canalith repositioning, [id number]- Aquatic Therapy, 97760- Splinting, [phone]- Wound care (first 20 sq cm), 97598- Wound care (each additional 20 sq cm)Patient/Family education, Balance training, Stair training, Taping, Dry Needling, Joint mobilization, Joint manipulation, Spinal manipulation, Spinal mobilization, Scar mobilization, and DME  instructions.   PLAN FOR NEXT SESSION: prosthetic donning and doffing and locking/unlocking; gait training, balance, transfers, bed mobility; left lower extremity strengthening, consider gravity minimized LE strengthening positions especially for left hip extension.   Lang Ada, PT, DPT [facility] Office: [phone] 2:41 PM, 06/29/24

## 2024-07-02 ENCOUNTER — Encounter (HOSPITAL_COMMUNITY)

## 2024-07-04 ENCOUNTER — Encounter (HOSPITAL_COMMUNITY): Payer: Self-pay

## 2024-07-04 ENCOUNTER — Ambulatory Visit (HOSPITAL_COMMUNITY)

## 2024-07-04 DIAGNOSIS — R2681 Unsteadiness on feet: Secondary | ICD-10-CM

## 2024-07-04 DIAGNOSIS — R262 Difficulty in walking, not elsewhere classified: Secondary | ICD-10-CM

## 2024-07-04 DIAGNOSIS — M6281 Muscle weakness (generalized): Secondary | ICD-10-CM

## 2024-07-04 DIAGNOSIS — Z89612 Acquired absence of left leg above knee: Secondary | ICD-10-CM | POA: Diagnosis not present

## 2024-07-04 DIAGNOSIS — R2689 Other abnormalities of gait and mobility: Secondary | ICD-10-CM

## 2024-07-04 NOTE — Therapy (Signed)
 OUTPATIENT PHYSICAL THERAPY LOWER EXTREMITY TREATMENT    Patient Name: Adam Long MRN: [id number] DOB:December 14, 1975, 48 y.o., male Today's Date: 07/04/2024  END OF SESSION:  PT End of Session - 07/04/24 0903     Visit Number 9    Number of Visits 16    Date for Recertification  08/03/24    Authorization Type [place] Medicaid [employer]    Authorization Time Period seeking additional auth    Authorization - Visit Number 8    Authorization - Number of Visits 12    Progress Note Due on Visit 12    PT Start Time 0904    PT Stop Time 0950    PT Time Calculation (min) 46 min    Activity Tolerance Patient tolerated treatment well;Patient limited by fatigue    Behavior During Therapy [facility] for tasks assessed/performed                Past Medical History:  Diagnosis Date   Anxiety    Aortic stenosis    Cellulitis and abscess of lower extremity 06/11/2019   Cellulitis of fourth toe of left foot    Cholelithiasis    Coronary artery disease    Nonobstructive CAD (40-50% LAD) 08/2019   Depression    Diabetes mellitus without complication (HCC)    Phreesia 09/27/2020   Elevated troponin level not due myocardial infarction 11/11/2019   Essential hypertension    Gangrene of toe of left foot (HCC) 07/06/2019   Heart murmur    Phreesia 09/27/2020   Hyperlipidemia    Phreesia 09/27/2020   Hypertension    Phreesia 09/27/2020   Mixed hyperlipidemia    Morbid obesity (HCC)    S/P aortic valve replacement with mechanical valve 12/05/2019   25 mm Carbomedics top hat bileaflet mechanical valve via partial upper hemi-sternotomy   Severe aortic stenosis 09/24/2019   Type 2 diabetes mellitus (HCC)    Past Surgical History:  Procedure Laterality Date   ABDOMINAL AORTOGRAM W/LOWER EXTREMITY N/A 07/06/2019   Procedure: ABDOMINAL AORTOGRAM W/LOWER EXTREMITY;  Surgeon: Harvey Carlin BRAVO, MD;  Location: [facility] INVASIVE CV LAB;  Service: Cardiovascular;  Laterality: N/A;  Bilateral   AMPUTATION  Left 07/09/2019   Procedure: LEFT FOURTH and Fifth TOE AMPUTATION.;  Surgeon: Oris Krystal FALCON, MD;  Location: [facility] OR;  Service: Vascular;  Laterality: Left;   AMPUTATION Left 03/11/2021   Procedure: LEFT FOOT 5TH  AND 4TH RAY AMPUTATION;  Surgeon: Harden Jerona GAILS, MD;  Location: [facility] OR;  Service: Orthopedics;  Laterality: Left;   AMPUTATION Left 08/28/2021   Procedure: AMPUTATION BELOW KNEE;  Surgeon: Harden Jerona GAILS, MD;  Location: [facility] OR;  Service: Orthopedics;  Laterality: Left;   AMPUTATION Left 01/29/2023   Procedure: LEFT AMPUTATION BELOW KNEE REVISION AND CLOSURE;  Surgeon: Harden Jerona GAILS, MD;  Location: [facility] OR;  Service: Orthopedics;  Laterality: Left;   AMPUTATION Left 07/27/2023   Procedure: LEFT ABOVE KNEE AMPUTATION;  Surgeon: Harden Jerona GAILS, MD;  Location: [facility] OR;  Service: Orthopedics;  Laterality: Left;   AORTIC VALVE REPLACEMENT N/A 12/05/2019   Procedure: PARTIAL STERNOTOMY FOR AORTIC VALVE REPLACEMENT (AVR), USING CARBOMEDICS SUPRA-ANNULAR TOP HAT ;  Surgeon: Dusty Sudie DEL, MD;  Location: [facility] OR;  Service: Open Heart Surgery;  Laterality: N/A;  No neck lines on left   APPLICATION OF WOUND VAC Left 10/30/2021   Procedure: APPLICATION OF WOUND VAC;  Surgeon: Harden Jerona GAILS, MD;  Location: [facility] OR;  Service: Orthopedics;  Laterality: Left;   APPLICATION  OF WOUND VAC Left 12/25/2021   Procedure: APPLICATION OF WOUND VAC;  Surgeon: Harden Jerona GAILS, MD;  Location: [facility] OR;  Service: Orthopedics;  Laterality: Left;   APPLICATION OF WOUND VAC Left 01/26/2023   Procedure: APPLICATION OF WOUND VAC TO LEFT STUMP;  Surgeon: Jerri Kay HERO, MD;  Location: [facility] OR;  Service: Orthopedics;  Laterality: Left;   CARDIAC VALVE REPLACEMENT N/A    Phreesia 09/27/2020   I & D EXTREMITY Left 01/26/2023   Procedure: IRRIGATION AND DEBRIDEMENT OF LEFT BKA STUMP;  Surgeon: Jerri Kay HERO, MD;  Location: [facility] OR;  Service: Orthopedics;  Laterality: Left;   IR RADIOLOGY PERIPHERAL GUIDED IV START  10/05/2019   IR US  GUIDE VASC  ACCESS RIGHT  10/05/2019   MULTIPLE EXTRACTIONS WITH ALVEOLOPLASTY N/A 10/26/2019   Procedure: EXTRACTION OF TOOTH #'S 3, 5-11,19-28,  AND 32 WITH ALVEOLOPLASTY;  Surgeon: Cyndee Tanda FALCON, DDS;  Location: [facility] OR;  Service: Oral Surgery;  Laterality: N/A;   RIGHT HEART CATH AND CORONARY ANGIOGRAPHY N/A 09/24/2019   Procedure: RIGHT HEART CATH AND CORONARY ANGIOGRAPHY;  Surgeon: Wonda Sharper, MD;  Location: [facility] INVASIVE CV LAB;  Service: Cardiovascular;  Laterality: N/A;   STUMP REVISION Left 10/30/2021   Procedure: REVISION LEFT BELOW KNEE AMPUTATION;  Surgeon: Harden Jerona GAILS, MD;  Location: [facility] OR;  Service: Orthopedics;  Laterality: Left;   STUMP REVISION Left 12/25/2021   Procedure: REVISION LEFT BELOW KNEE AMPUTATION;  Surgeon: Harden Jerona GAILS, MD;  Location: [facility] OR;  Service: Orthopedics;  Laterality: Left;   TEE WITHOUT CARDIOVERSION N/A 12/05/2019   Procedure: TRANSESOPHAGEAL ECHOCARDIOGRAM (TEE);  Surgeon: Dusty Sudie DEL, MD;  Location: [facility] OR;  Service: Open Heart Surgery;  Laterality: N/A;   TEE WITHOUT CARDIOVERSION N/A 09/01/2021   Procedure: TRANSESOPHAGEAL ECHOCARDIOGRAM (TEE);  Surgeon: Kate Lonni CROME, MD;  Location: [facility] ENDOSCOPY;  Service: Cardiovascular;  Laterality: N/A;   Patient Active Problem List   Diagnosis Date Noted   Vitamin B12 deficiency 02/15/2024   Iron deficiency anemia 12/22/2023   S/P AKA (above knee amputation), left (HCC) 07/31/2023   Hepatic steatosis 03/25/2023   GERD (gastroesophageal reflux disease) 01/24/2023   History of osteomyelitis 10/12/2022   Uncontrolled type 2 diabetes mellitus with hyperglycemia (HCC) 10/12/2022   CAD (coronary artery disease) 10/12/2022   Encounter for therapeutic drug monitoring 10/12/2022   Anxiety 09/30/2020   S/P aortic valve replacement with mechanical valve 12/05/2019   Hyperlipidemia associated with type 2 diabetes mellitus (HCC) 10/12/2015   Class 3 obesity (HCC) 10/12/2015   HTN (hypertension) 07/02/2013     PCP: Bluford Jacqulyn MATSU, DO  REFERRING PROVIDER: Valdemar Rocky SAUNDERS, NP  REFERRING DIAG: [phone] (ICD-10-CM) - S/P AKA (above knee amputation), left (HCC)  THERAPY DIAG:  S/P AKA (above knee amputation) unilateral, left (HCC)  Difficulty in walking, not elsewhere classified  Other abnormalities of gait and mobility  Unsteadiness on feet  Muscle weakness (generalized)  Rationale for Evaluation and Treatment: Rehabilitation  ONSET DATE: 07/27/23 s/p  SUBJECTIVE:   SUBJECTIVE STATEMENT: Pt reports fair report from heart doctor, something. Pt still reports not eating before appointment today due to GI issues, BP medicine not taken before appointment. Pt states he was going to try to don prosthetic by himself yesterday but had something come up.       S/p 07/27/23 left AKA.  Received prosthesis last month. History of multiple surgeries on the left lower extremity.   Previously had a below knee amputation and managed well but this prosthesis  is quite a bit more challenging.  Has been using a WC primarily for mobility and uses a walker to transfer (RW).  [employer] is prosthetist.  Arrives in [facility] with family cousins who assist as needed.    PERTINENT HISTORY: Anemia; getting iron infusions Has artificial heart valve. PAIN:  Are you having pain? Yes: NPRS scale: 0 Pain location: phantom pain left leg; right knee sometimes Pain description: nerve pain, right knee aching Aggravating factors: right knee when being up on it Relieving factors: Gabapentin , Tylenol   PRECAUTIONS: None  RED FLAGS: None   WEIGHT BEARING RESTRICTIONS: No  FALLS:  Has patient fallen in last 6 months? Yes. Number of falls 1  LIVING ENVIRONMENT: Lives with: lives with their family Lives in: House/apartment Stairs: No ramp entry Has following equipment at home: Vannie - 2 wheeled, Wheelchair (power), Wheelchair (manual), shower chair, bed side commode, and Ramped entry bed cane  OCCUPATION: not working;  [employer] before  PLOF: Independent with household mobility with device  PATIENT GOALS: want to walk on 2 feet; being able to work some  NEXT MD VISIT: hematologist in August  OBJECTIVE:  Note: Objective measures were completed at Evaluation unless otherwise noted.  DIAGNOSTIC FINDINGS:   PATIENT SURVEYS:  LEFS 27/80 33.8% 06/22/24: 13 / 80 = 16.3 % Extreme difficulty/unable (0), Quite a bit of difficulty (1), Moderate difficulty (2), Little difficulty (3), No difficulty (4) Survey date:    Any of your usual work, housework or school activities   2. Usual hobbies, recreational or sporting activities   3. Getting into/out of the bath   4. Walking between rooms   5. Putting on socks/shoes   6. Squatting    7. Lifting an object, like a bag of groceries from the floor   8. Performing light activities around your home   9. Performing heavy activities around your home   10. Getting into/out of a car   11. Walking 2 blocks   12. Walking 1 mile   13. Going up/down 10 stairs (1 flight)   14. Standing for 1 hour   15.  sitting for 1 hour   16. Running on even ground   17. Running on uneven ground   18. Making sharp turns while running fast   19. Hopping    20. Rolling over in bed   Score total:  27/80     COGNITION: Overall cognitive status: Within functional limits for tasks assessed     SENSATION: Not tested  EDEMA:  None noted  POSTURE: rounded shoulders, forward head, flexed trunk , and weight shift right  PALPATION: No significant tenderness noted  LOWER EXTREMITY ROM:  Active ROM Right eval Left eval  Hip flexion    Hip extension    Hip abduction    Hip adduction    Hip internal rotation    Hip external rotation    Knee flexion    Knee extension    Ankle dorsiflexion    Ankle plantarflexion    Ankle inversion    Ankle eversion     (Blank rows = not tested)  LOWER EXTREMITY MMT:  MMT Right eval Left eval  Hip flexion    Hip extension     Hip abduction    Hip adduction    Hip internal rotation    Hip external rotation    Knee flexion    Knee extension    Ankle dorsiflexion    Ankle plantarflexion    Ankle inversion  Ankle eversion     (Blank rows = not tested)   FUNCTIONAL TESTS:  30 seconds chair stand test x 1 with min A for safety to RW  GAIT: Distance walked: 0 ft Assistive device utilized: Environmental consultant - 2 wheeled Level of assistance: Min A Comments: patient unable to get left prosthesis to lock out to take a step                                                                                                                                TREATMENT DATE:  07/04/2024  Vitals: BP: 157/90 mm/Hg HR: 81 bpm O2 sat: Neuromuscular Re-education: -Step up and over to lateral step up and over, 1 bout, 6 inch step and 4 inch step, pt cued for deep breath and standing upright, min assist provided -Standing with no UE support, 2 bouts 18 seconds apiece, pt in parallel bars and SBA for safety Therapeutic Activity: -Lateral stepping in parallel bars, 1 lap, pt gets more panic when further from [facility], pt cued for breathing and upright posture -Mat table to wheelchair transfer, x1, pt require WC on right side to complete stand pivot transfer from elevated mat table. Pt educated on the importance of reaching back for chair arm for safety with stand to sit transfer -Weight shifts with prosthetic unlocked, pt continues to demonstrate decreased ability to bear weight on prosthesis with it in unlocked position, pt cued for upright trunk and increased L hip extension. Gait Training: -Ambulation trial with personal RW with locked prosthetic, 2 bouts of  about 15 feet, pt requires min therapist assist with wheel chair follow for ambulation, pt cued for foot placement as pt demonstrates improved coordination and more appropriate step length with prosthesis.   06/29/2024  174/98 mm/Hg un medicated 97 BPM Neuromuscular  Re-education: -Step up and overs, 1 set of 3 reps, 6 inch step, weight shift each way down, pt cued for deep breath and standing upright, min assist provided -Lateral step up and overs, 1 set of 3 reps, 4 inch step, pt requires mod assist for this, cued for increased step clearance Therapeutic Activity: -Mat table to wheelchair transfer, x2, pt require WC on right side to complete stand pivot transfer from elevated mat table. Pt educated on the importance of reaching back for chair arm for safety with stand to sit transfer -Standing hip abductions, 1 set of 8 reps, LLE only, pt cued for max ROM Gait Training: -Ambulation trial with personal RW with locked prosthetic, 2 bouts of  about 12 feet to parallel bars, pt requires mod therapist assist with ambulation, pt cued for foot placement as pt demonstrates improved coordination and more appropriate step length with prosthesis.   06/14/2024  Neuromuscular Re-education: -Weight shifting on and off unlocked prosthetic leg, 2 sets of 10 reps in RW, pt cued for increased left hip extension, BUE support required -Cone touches from 2 inch step, with LLE with lock out with  hip extension, 2 sets of 6 reps pt cued for upright posture and increased hip extension, BUE support required Therapeutic Activity: -Mat table to wheelchair transfer, x1, pt require WC on right side to complete stand pivot transfer from elevated mat table. Pt educated on the importance of reaching back for chair arm for safety with stand to sit transfer Gait Training: -Ambulation trial with personal RW with locked prosthetic, 1 bout of  about 12 feet to parallel bars, pt requires mod therapist assist with ambulation, pt cued for foot placement as pt continues to demonstrate decreased balance and narrow BOS  PATIENT EDUCATION:  Education details: Patient educated on exam findings, POC, scope of PT, HEP, and discussion regarding calling prosthetist for adjustments. Person educated:  Patient Education method: Explanation, Demonstration, and Handouts Education comprehension: verbalized understanding, returned demonstration, verbal cues required, and tactile cues required   HOME EXERCISE PROGRAM: Access Code: [email or web address] URL: [email or web address] Date: 04/26/2024 Prepared by: AP - Rehab  Exercises - Standing Single Leg Stance with Counter Support  - 2 x daily - 7 x weekly - 1 sets - 2 reps - Lying Prone  - 2 x daily - 7 x weekly - 1 sets - 1 reps - 5 min hold - Sidelying Hip Abduction (AKA)  - 2 x daily - 7 x weekly - 2 sets - 10 reps - Prone Hip Extension with Residual Limb (BKA)  - 2 x daily - 7 x weekly - 2 sets - 10 reps   Access Code: [email or web address] URL: [email or web address] Date: 06/22/2024 Prepared by: Lang Ada  Exercises - Sidelying Hip Abduction  - 1 x daily - 7 x weekly - 3 sets - 10 reps - Supine Active Straight Leg Raise  - 1 x daily - 7 x weekly - 3 sets - 10 reps - Prone Hip Extension  - 1 x daily - 7 x weekly - 3 sets - 10 reps ASSESSMENT:  CLINICAL IMPRESSION: Patient continues to demonstrate impaired functional mobility, decreased independence with donning of prosthesis, decreased LLE strength, decreased gait quality and balance. Patient also demonstrates increased panic with activities that keep him away from wheel chair for extended periods of time. Pt continues to demonstrate decreased standing tolerance although improved during today's session (5 minutes obtained in one bout), SOB noted following prolonged standing and multiple sitting breaks required. Patient able to progress dynamic balance and prolonged standing endurance exercises today with balance activity and step up variations, good performance with verbal cueing. Pt requires education for importance of follow up with GI doctor for GI issues as they are still impacting his eating habits and taking antidiuretics as baseline. Patient would continue to benefit from  skilled physical therapy for increased endurance with ambulation, increased LLE strength, and improved balance for improved quality of life, improved independence with gait training and continued progress towards therapy goals.       Patient is a 48 y.o. male who was seen today for physical therapy evaluation and treatment for Z89.612 (ICD-10-CM) - S/P AKA (above knee amputation), left (HCC). Patient demonstrates decreased strength, balance deficits and gait abnormalities which are negatively impacting patient ability to perform ADLs and functional mobility tasks. Patient will benefit from skilled physical therapy services to address these deficits to improve level of function with ADLs, functional mobility tasks, and reduce risk for falls.    OBJECTIVE IMPAIRMENTS: Abnormal gait, decreased activity tolerance, decreased balance, difficulty walking, decreased strength, obesity, and pain.   ACTIVITY LIMITATIONS: carrying, lifting, standing, squatting, stairs, transfers,  bed mobility, and locomotion level  PARTICIPATION LIMITATIONS: meal prep, cleaning, shopping, community activity, and yard work  PERSONAL FACTORS: Time since onset of injury/illness/exacerbation and 1 comorbidity: anemia are also affecting patient's functional outcome.   REHAB POTENTIAL: Good  CLINICAL DECISION MAKING: Evolving/moderate complexity  EVALUATION COMPLEXITY: Moderate   GOALS: Goals reviewed with patient? No  SHORT TERM GOALS: Target date: 05/24/2024 patient will be independent with initial HEP  Baseline: Goal status: Progressing  2.  Patient will report 30% improvement overall  Baseline:  Goal status: Progressing  3.  Patient will be able to don and doff prosthesis independently Baseline: needs mod assist Goal status: Progressing   LONG TERM GOALS: Target date: 06/21/24  Patient will be independent in self management strategies to improve quality of life and functional outcomes.  Baseline:   Goal status: Partially Met  2.  Patient will report 50% improvement overall  Baseline:  Goal status: Progressing  3.  Patient will improve LEFS score by 23 points to demonstrate improved perceived function  Baseline: 27/80 Goal status: Progressing  4.  Patient will ambulate with LRAD and prosthesis x 200 ft in 2 minutes or less Baseline: unable Goal status: Progressing  PLAN:  PT FREQUENCY: 2x/week  PT DURATION: 6 weeks  PLANNED INTERVENTIONS: [id number]- PT Re-evaluation, 97110-Therapeutic exercises, 97530- Therapeutic activity, 97112- Neuromuscular re-education, 97535- Self Care, [id number]- Manual therapy, [id number]- Gait training, [phone]- Orthotic Fit/training, [phone]- Canalith repositioning, [id number]- Aquatic Therapy, 97760- Splinting, [phone]- Wound care (first 20 sq cm), 97598- Wound care (each additional 20 sq cm)Patient/Family education, Balance training, Stair training, Taping, Dry Needling, Joint mobilization, Joint manipulation, Spinal manipulation, Spinal mobilization, Scar mobilization, and DME instructions.   PLAN FOR NEXT SESSION: prosthetic donning and doffing and locking/unlocking; gait training, balance, transfers, bed mobility; left lower extremity strengthening, consider gravity minimized LE strengthening positions especially for left hip extension.   Lang Ada, PT, DPT [facility] Office: [phone] 10:20 AM, 07/04/24

## 2024-07-05 ENCOUNTER — Encounter (HOSPITAL_COMMUNITY)

## 2024-07-10 ENCOUNTER — Ambulatory Visit (HOSPITAL_COMMUNITY)

## 2024-07-10 ENCOUNTER — Encounter (HOSPITAL_COMMUNITY): Payer: Self-pay

## 2024-07-10 DIAGNOSIS — R262 Difficulty in walking, not elsewhere classified: Secondary | ICD-10-CM | POA: Diagnosis not present

## 2024-07-10 DIAGNOSIS — Z89612 Acquired absence of left leg above knee: Secondary | ICD-10-CM

## 2024-07-10 DIAGNOSIS — M6281 Muscle weakness (generalized): Secondary | ICD-10-CM

## 2024-07-10 DIAGNOSIS — R2689 Other abnormalities of gait and mobility: Secondary | ICD-10-CM

## 2024-07-10 DIAGNOSIS — R2681 Unsteadiness on feet: Secondary | ICD-10-CM

## 2024-07-10 NOTE — Therapy (Signed)
 OUTPATIENT PHYSICAL THERAPY LOWER EXTREMITY TREATMENT    Patient Name: Adam Long MRN: [id number] DOB:August 26, 1976, 48 y.o., male Today's Date: 07/10/2024  END OF SESSION:  PT End of Session - 07/10/24 0945     Visit Number 10    Number of Visits 16    Date for Recertification  08/03/24    Authorization Type [place] [employer]    Authorization Time Period seeking additional auth    Authorization - Visit Number 9    Authorization - Number of Visits 12    Progress Note Due on Visit 12    PT Start Time 0945    PT Stop Time 1025    PT Time Calculation (min) 40 min    Activity Tolerance Patient tolerated treatment well;Patient limited by fatigue    Behavior During Therapy [facility] for tasks assessed/performed                 Past Medical History:  Diagnosis Date   Anxiety    Aortic stenosis    Cellulitis and abscess of lower extremity 06/11/2019   Cellulitis of fourth toe of left foot    Cholelithiasis    Coronary artery disease    Nonobstructive CAD (40-50% LAD) 08/2019   Depression    Diabetes mellitus without complication (HCC)    Phreesia 09/27/2020   Elevated troponin level not due myocardial infarction 11/11/2019   Essential hypertension    Gangrene of toe of left foot (HCC) 07/06/2019   Heart murmur    Phreesia 09/27/2020   Hyperlipidemia    Phreesia 09/27/2020   Hypertension    Phreesia 09/27/2020   Mixed hyperlipidemia    Morbid obesity (HCC)    S/P aortic valve replacement with mechanical valve 12/05/2019   25 mm Carbomedics top hat bileaflet mechanical valve via partial upper hemi-sternotomy   Severe aortic stenosis 09/24/2019   Type 2 diabetes mellitus (HCC)    Past Surgical History:  Procedure Laterality Date   ABDOMINAL AORTOGRAM W/LOWER EXTREMITY N/A 07/06/2019   Procedure: ABDOMINAL AORTOGRAM W/LOWER EXTREMITY;  Surgeon: Harvey Carlin BRAVO, MD;  Location: [facility] INVASIVE CV LAB;  Service: Cardiovascular;  Laterality: N/A;  Bilateral    AMPUTATION Left 07/09/2019   Procedure: LEFT FOURTH and Fifth TOE AMPUTATION.;  Surgeon: Oris Krystal FALCON, MD;  Location: [facility] OR;  Service: Vascular;  Laterality: Left;   AMPUTATION Left 03/11/2021   Procedure: LEFT FOOT 5TH  AND 4TH RAY AMPUTATION;  Surgeon: Harden Jerona GAILS, MD;  Location: [facility] OR;  Service: Orthopedics;  Laterality: Left;   AMPUTATION Left 08/28/2021   Procedure: AMPUTATION BELOW KNEE;  Surgeon: Harden Jerona GAILS, MD;  Location: [facility] OR;  Service: Orthopedics;  Laterality: Left;   AMPUTATION Left 01/29/2023   Procedure: LEFT AMPUTATION BELOW KNEE REVISION AND CLOSURE;  Surgeon: Harden Jerona GAILS, MD;  Location: [facility] OR;  Service: Orthopedics;  Laterality: Left;   AMPUTATION Left 07/27/2023   Procedure: LEFT ABOVE KNEE AMPUTATION;  Surgeon: Harden Jerona GAILS, MD;  Location: [facility] OR;  Service: Orthopedics;  Laterality: Left;   AORTIC VALVE REPLACEMENT N/A 12/05/2019   Procedure: PARTIAL STERNOTOMY FOR AORTIC VALVE REPLACEMENT (AVR), USING CARBOMEDICS SUPRA-ANNULAR TOP HAT ;  Surgeon: Dusty Sudie DEL, MD;  Location: [facility] OR;  Service: Open Heart Surgery;  Laterality: N/A;  No neck lines on left   APPLICATION OF WOUND VAC Left 10/30/2021   Procedure: APPLICATION OF WOUND VAC;  Surgeon: Harden Jerona GAILS, MD;  Location: [facility] OR;  Service: Orthopedics;  Laterality: Left;  APPLICATION OF WOUND VAC Left 12/25/2021   Procedure: APPLICATION OF WOUND VAC;  Surgeon: Harden Jerona GAILS, MD;  Location: [facility] OR;  Service: Orthopedics;  Laterality: Left;   APPLICATION OF WOUND VAC Left 01/26/2023   Procedure: APPLICATION OF WOUND VAC TO LEFT STUMP;  Surgeon: Jerri Kay HERO, MD;  Location: [facility] OR;  Service: Orthopedics;  Laterality: Left;   CARDIAC VALVE REPLACEMENT N/A    Phreesia 09/27/2020   I & D EXTREMITY Left 01/26/2023   Procedure: IRRIGATION AND DEBRIDEMENT OF LEFT BKA STUMP;  Surgeon: Jerri Kay HERO, MD;  Location: [facility] OR;  Service: Orthopedics;  Laterality: Left;   IR RADIOLOGY PERIPHERAL GUIDED IV START  10/05/2019   IR US   GUIDE VASC ACCESS RIGHT  10/05/2019   MULTIPLE EXTRACTIONS WITH ALVEOLOPLASTY N/A 10/26/2019   Procedure: EXTRACTION OF TOOTH #'S 3, 5-11,19-28,  AND 32 WITH ALVEOLOPLASTY;  Surgeon: Cyndee Tanda FALCON, DDS;  Location: [facility] OR;  Service: Oral Surgery;  Laterality: N/A;   RIGHT HEART CATH AND CORONARY ANGIOGRAPHY N/A 09/24/2019   Procedure: RIGHT HEART CATH AND CORONARY ANGIOGRAPHY;  Surgeon: Wonda Sharper, MD;  Location: [facility] INVASIVE CV LAB;  Service: Cardiovascular;  Laterality: N/A;   STUMP REVISION Left 10/30/2021   Procedure: REVISION LEFT BELOW KNEE AMPUTATION;  Surgeon: Harden Jerona GAILS, MD;  Location: [facility] OR;  Service: Orthopedics;  Laterality: Left;   STUMP REVISION Left 12/25/2021   Procedure: REVISION LEFT BELOW KNEE AMPUTATION;  Surgeon: Harden Jerona GAILS, MD;  Location: [facility] OR;  Service: Orthopedics;  Laterality: Left;   TEE WITHOUT CARDIOVERSION N/A 12/05/2019   Procedure: TRANSESOPHAGEAL ECHOCARDIOGRAM (TEE);  Surgeon: Dusty Sudie DEL, MD;  Location: [facility] OR;  Service: Open Heart Surgery;  Laterality: N/A;   TEE WITHOUT CARDIOVERSION N/A 09/01/2021   Procedure: TRANSESOPHAGEAL ECHOCARDIOGRAM (TEE);  Surgeon: Kate Lonni CROME, MD;  Location: [facility] ENDOSCOPY;  Service: Cardiovascular;  Laterality: N/A;   Patient Active Problem List   Diagnosis Date Noted   Vitamin B12 deficiency 02/15/2024   Iron deficiency anemia 12/22/2023   S/P AKA (above knee amputation), left (HCC) 07/31/2023   Hepatic steatosis 03/25/2023   GERD (gastroesophageal reflux disease) 01/24/2023   History of osteomyelitis 10/12/2022   Uncontrolled type 2 diabetes mellitus with hyperglycemia (HCC) 10/12/2022   CAD (coronary artery disease) 10/12/2022   Encounter for therapeutic drug monitoring 10/12/2022   Anxiety 09/30/2020   S/P aortic valve replacement with mechanical valve 12/05/2019   Hyperlipidemia associated with type 2 diabetes mellitus (HCC) 10/12/2015   Class 3 obesity (HCC) 10/12/2015   HTN (hypertension)  07/02/2013    PCP: Bluford Jacqulyn MATSU, DO  REFERRING PROVIDER: Valdemar Rocky SAUNDERS, NP  REFERRING DIAG: [phone] (ICD-10-CM) - S/P AKA (above knee amputation), left (HCC)  THERAPY DIAG:  S/P AKA (above knee amputation) unilateral, left (HCC)  Difficulty in walking, not elsewhere classified  Other abnormalities of gait and mobility  Unsteadiness on feet  Muscle weakness (generalized)  Rationale for Evaluation and Treatment: Rehabilitation  ONSET DATE: 07/27/23 s/p  SUBJECTIVE:   SUBJECTIVE STATEMENT: Pt reports fair report from heart doctor, something. Pt still reports not eating before appointment today due to GI issues, BP medicine not taken before appointment. Pt states he was going to try to don prosthetic by himself yesterday but had something come up.       S/p 07/27/23 left AKA.  Received prosthesis last month. History of multiple surgeries on the left lower extremity.   Previously had a below knee amputation and managed well but this  prosthesis is quite a bit more challenging.  Has been using a WC primarily for mobility and uses a walker to transfer (RW).  [employer] is prosthetist.  [place] [facility] with family cousins who assist as needed.    PERTINENT HISTORY: Anemia; getting iron infusions Has artificial heart valve. PAIN:  Are you having pain? Yes: NPRS scale: 0 Pain location: phantom pain left leg; right knee sometimes Pain description: nerve pain, right knee aching Aggravating factors: right knee when being up on it Relieving factors: Gabapentin , Tylenol   PRECAUTIONS: None  RED FLAGS: None   WEIGHT BEARING RESTRICTIONS: No  FALLS:  Has patient fallen in last 6 months? Yes. Number of falls 1  LIVING ENVIRONMENT: Lives with: lives with their family Lives in: House/apartment Stairs: No ramp entry Has following equipment at home: Vannie - 2 wheeled, Wheelchair (power), Wheelchair (manual), shower chair, bed side commode, and Ramped entry bed cane  OCCUPATION: not  working; [employer] before  PLOF: Independent with household mobility with device  PATIENT GOALS: want to walk on 2 feet; being able to work some  NEXT MD VISIT: hematologist in August  OBJECTIVE:  Note: Objective measures were completed at Evaluation unless otherwise noted.  DIAGNOSTIC FINDINGS:   PATIENT SURVEYS:  LEFS 27/80 33.8% 06/22/24: 13 / 80 = 16.3 % Extreme difficulty/unable (0), Quite a bit of difficulty (1), Moderate difficulty (2), Little difficulty (3), No difficulty (4) Survey date:    Any of your usual work, housework or school activities   2. Usual hobbies, recreational or sporting activities   3. Getting into/out of the bath   4. Walking between rooms   5. Putting on socks/shoes   6. Squatting    7. Lifting an object, like a bag of groceries from the floor   8. Performing light activities around your home   9. Performing heavy activities around your home   10. Getting into/out of a car   11. Walking 2 blocks   12. Walking 1 mile   13. Going up/down 10 stairs (1 flight)   14. Standing for 1 hour   15.  sitting for 1 hour   16. Running on even ground   17. Running on uneven ground   18. Making sharp turns while running fast   19. Hopping    20. Rolling over in bed   Score total:  27/80     COGNITION: Overall cognitive status: Within functional limits for tasks assessed     SENSATION: Not tested  EDEMA:  None noted  POSTURE: rounded shoulders, forward head, flexed trunk , and weight shift right  PALPATION: No significant tenderness noted  LOWER EXTREMITY ROM:  Active ROM Right eval Left eval  Hip flexion    Hip extension    Hip abduction    Hip adduction    Hip internal rotation    Hip external rotation    Knee flexion    Knee extension    Ankle dorsiflexion    Ankle plantarflexion    Ankle inversion    Ankle eversion     (Blank rows = not tested)  LOWER EXTREMITY MMT:  MMT Right eval Left eval  Hip flexion    Hip  extension    Hip abduction    Hip adduction    Hip internal rotation    Hip external rotation    Knee flexion    Knee extension    Ankle dorsiflexion    Ankle plantarflexion    Ankle inversion  Ankle eversion     (Blank rows = not tested)   FUNCTIONAL TESTS:  30 seconds chair stand test x 1 with min A for safety to RW  GAIT: Distance walked: 0 ft Assistive device utilized: Environmental consultant - 2 wheeled Level of assistance: Min A Comments: patient unable to get left prosthesis to lock out to take a step                                                                                                                                TREATMENT DATE:  07/10/2024  Vitals: BP: pre: 188/102 mmHg; post: 176/99 mmHg HR: pre: 72; post: 74 Neuromuscular Re-education: -Step ups, 1 bout, 6 inch step, mirror used for visual feed back an improved posture, 1 set of 6 reps, pt cued for deep breath, sequencing, LE placement and standing upright, min assist provided -Standing with no UE support, mirror used for visual feed back an improved posture, 2 bouts 20 seconds apiece, pt in parallel bars and SBA for safety -Ambulation trial with personal RW with locked prosthetic, 1 bouts of  about 18 feet, mirror used for visual feed back an improved posture, pt requires CGA therapist assist with wheel chair follow for ambulation, pt cued for foot placement as pt demonstrates improved coordination and more appropriate step length with prosthesis.  Therapeutic Activity: -3 way kick, 2 sets of 4 reps, 4lb ankle weight on prosthetic side, pt cued for increased ROM, mirror used for visual feed back an improved posture -Lateral stepping to parallel bars, 1 bout of 6 feet, pt still demonstrating hesitancy with SLS on LLE, pt cued for breathing and upright posture -Mat table to wheelchair transfer, x1, pt require WC on right side to complete stand pivot transfer from elevated mat table. Pt educated on the importance of  reaching back for chair arm for safety with stand to sit transfer -Weight shifts with prosthetic unlocked, 1 set of 7 reps, pt continues to demonstrate decreased ability to bear weight on prosthesis with it in unlocked position, pt cued for upright trunk and increased L hip flexion/extension.    07/04/2024  Vitals: BP: 157/90 mm/Hg HR: 81 bpm O2 sat: Neuromuscular Re-education: -Step up and over to lateral step up and over, 1 bout, 6 inch step and 4 inch step, pt cued for deep breath and standing upright, min assist provided -Standing with no UE support, 2 bouts 18 seconds apiece, pt in parallel bars and SBA for safety Therapeutic Activity: -Lateral stepping in parallel bars, 1 lap, pt gets more panic when further from [facility], pt cued for breathing and upright posture -Mat table to wheelchair transfer, x1, pt require WC on right side to complete stand pivot transfer from elevated mat table. Pt educated on the importance of reaching back for chair arm for safety with stand to sit transfer -Weight shifts with prosthetic unlocked, pt continues to demonstrate decreased ability to bear weight on prosthesis with it  in unlocked position, pt cued for upright trunk and increased L hip extension. Gait Training: -Ambulation trial with personal RW with locked prosthetic, 2 bouts of  about 15 feet, pt requires min therapist assist with wheel chair follow for ambulation, pt cued for foot placement as pt demonstrates improved coordination and more appropriate step length with prosthesis.   06/29/2024  174/98 mm/Hg un medicated 97 BPM Neuromuscular Re-education: -Step up and overs, 1 set of 3 reps, 6 inch step, weight shift each way down, pt cued for deep breath and standing upright, min assist provided -Lateral step up and overs, 1 set of 3 reps, 4 inch step, pt requires mod assist for this, cued for increased step clearance Therapeutic Activity: -Mat table to wheelchair transfer, x2, pt require WC on right  side to complete stand pivot transfer from elevated mat table. Pt educated on the importance of reaching back for chair arm for safety with stand to sit transfer -Standing hip abductions, 1 set of 8 reps, LLE only, pt cued for max ROM Gait Training: -Ambulation trial with personal RW with locked prosthetic, 2 bouts of  about 12 feet to parallel bars, pt requires mod therapist assist with ambulation, pt cued for foot placement as pt demonstrates improved coordination and more appropriate step length with prosthesis.   PATIENT EDUCATION:  Education details: Patient educated on exam findings, POC, scope of PT, HEP, and discussion regarding calling prosthetist for adjustments. Person educated: Patient Education method: Explanation, Demonstration, and Handouts Education comprehension: verbalized understanding, returned demonstration, verbal cues required, and tactile cues required   HOME EXERCISE PROGRAM: Access Code: [email or web address] URL: [email or web address] Date: 04/26/2024 Prepared by: AP - Rehab  Exercises - Standing Single Leg Stance with Counter Support  - 2 x daily - 7 x weekly - 1 sets - 2 reps - Lying Prone  - 2 x daily - 7 x weekly - 1 sets - 1 reps - 5 min hold - Sidelying Hip Abduction (AKA)  - 2 x daily - 7 x weekly - 2 sets - 10 reps - Prone Hip Extension with Residual Limb (BKA)  - 2 x daily - 7 x weekly - 2 sets - 10 reps   Access Code: [email or web address] URL: [email or web address] Date: 06/22/2024 Prepared by: Lang Ada  Exercises - Sidelying Hip Abduction  - 1 x daily - 7 x weekly - 3 sets - 10 reps - Supine Active Straight Leg Raise  - 1 x daily - 7 x weekly - 3 sets - 10 reps - Prone Hip Extension  - 1 x daily - 7 x weekly - 3 sets - 10 reps ASSESSMENT:  CLINICAL IMPRESSION: Patient continues to demonstrate decreased LE strength, decreased functional mobility gait quality and balance. Patient also demonstrates increased motivation during today's  session, able to stand longer with no UE support and increase standing tolerance to almost 8 minutes during today's session. Patient able to progress dynamic balance and core activation exercises today with functional activities with ankle weight on prosthetic and standing balance variations, good performance with verbal cueing. Patient would continue to benefit from skilled physical therapy for increased endurance with ambulation, increased LE strength, and improved balance for improved quality of life, improved independence with gait training and continued progress towards therapy goals.   Patient is a 48 y.o. male who was seen today for physical therapy evaluation and treatment for Z89.612 (ICD-10-CM) - S/P AKA (above knee amputation), left (HCC). Patient demonstrates decreased strength, balance deficits and gait abnormalities which are  negatively impacting patient ability to perform ADLs and functional mobility tasks. Patient will benefit from skilled physical therapy services to address these deficits to improve level of function with ADLs, functional mobility tasks, and reduce risk for falls.    OBJECTIVE IMPAIRMENTS: Abnormal gait, decreased activity tolerance, decreased balance, difficulty walking, decreased strength, obesity, and pain.   ACTIVITY LIMITATIONS: carrying, lifting, standing, squatting, stairs, transfers, bed mobility, and locomotion level  PARTICIPATION LIMITATIONS: meal prep, cleaning, shopping, community activity, and yard work  PERSONAL FACTORS: Time since onset of injury/illness/exacerbation and 1 comorbidity: anemia are also affecting patient's functional outcome.   REHAB POTENTIAL: Good  CLINICAL DECISION MAKING: Evolving/moderate complexity  EVALUATION COMPLEXITY: Moderate   GOALS: Goals reviewed with patient? No  SHORT TERM GOALS: Target date: 05/24/2024 patient will be independent with initial HEP  Baseline: Goal status: Progressing  2.  Patient will  report 30% improvement overall  Baseline:  Goal status: Progressing  3.  Patient will be able to don and doff prosthesis independently Baseline: needs mod assist Goal status: Progressing   LONG TERM GOALS: Target date: 06/21/24  Patient will be independent in self management strategies to improve quality of life and functional outcomes.  Baseline:  Goal status: Partially Met  2.  Patient will report 50% improvement overall  Baseline:  Goal status: Progressing  3.  Patient will improve LEFS score by 23 points to demonstrate improved perceived function  Baseline: 27/80 Goal status: Progressing  4.  Patient will ambulate with LRAD and prosthesis x 200 ft in 2 minutes or less Baseline: unable Goal status: Progressing  PLAN:  PT FREQUENCY: 2x/week  PT DURATION: 6 weeks  PLANNED INTERVENTIONS: [id number]- PT Re-evaluation, 97110-Therapeutic exercises, 97530- Therapeutic activity, 97112- Neuromuscular re-education, 97535- Self Care, [id number]- Manual therapy, [id number]- Gait training, [phone]- Orthotic Fit/training, [phone]- Canalith repositioning, [id number]- Aquatic Therapy, 97760- Splinting, [phone]- Wound care (first 20 sq cm), 97598- Wound care (each additional 20 sq cm)Patient/Family education, Balance training, Stair training, Taping, Dry Needling, Joint mobilization, Joint manipulation, Spinal manipulation, Spinal mobilization, Scar mobilization, and DME instructions.   PLAN FOR NEXT SESSION: prosthetic donning and doffing and locking/unlocking; gait training, balance, transfers, bed mobility; left lower extremity strengthening, consider gravity minimized LE strengthening positions especially for left hip extension.   Lang Ada, PT, DPT [facility] Office: [phone] 11:56 AM, 07/10/24

## 2024-07-12 ENCOUNTER — Encounter (HOSPITAL_COMMUNITY)

## 2024-07-19 ENCOUNTER — Ambulatory Visit (HOSPITAL_COMMUNITY)

## 2024-07-20 ENCOUNTER — Ambulatory Visit (HOSPITAL_COMMUNITY)

## 2024-07-20 ENCOUNTER — Encounter (HOSPITAL_COMMUNITY): Payer: Self-pay

## 2024-07-20 DIAGNOSIS — R262 Difficulty in walking, not elsewhere classified: Secondary | ICD-10-CM

## 2024-07-20 DIAGNOSIS — R2689 Other abnormalities of gait and mobility: Secondary | ICD-10-CM

## 2024-07-20 DIAGNOSIS — R2681 Unsteadiness on feet: Secondary | ICD-10-CM

## 2024-07-20 DIAGNOSIS — Z89612 Acquired absence of left leg above knee: Secondary | ICD-10-CM

## 2024-07-20 DIAGNOSIS — M6281 Muscle weakness (generalized): Secondary | ICD-10-CM

## 2024-07-20 NOTE — Therapy (Signed)
 OUTPATIENT PHYSICAL THERAPY LOWER EXTREMITY TREATMENT    Patient Name: Adam Long MRN: [id number] DOB:[birth year], 48 y.o., male Today's Date: 07/20/2024  END OF SESSION:  PT End of Session - 07/20/24 1245     Visit Number 11    Number of Visits 16    Date for Recertification  08/03/24    Authorization Type [place] [employer]    Authorization Time Period seeking additional auth    Authorization - Visit Number 10    Authorization - Number of Visits 12    Progress Note Due on Visit 12    PT Start Time 1246    PT Stop Time 1325    PT Time Calculation (min) 39 min    Activity Tolerance Patient tolerated treatment well;Patient limited by fatigue    Behavior During Therapy [facility] for tasks assessed/performed                  Past Medical History:  Diagnosis Date   Anxiety    Aortic stenosis    Cellulitis and abscess of lower extremity 06/11/2019   Cellulitis of fourth toe of left foot    Cholelithiasis    Coronary artery disease    Nonobstructive CAD (40-50% LAD) 08/2019   Depression    Diabetes mellitus without complication (HCC)    Phreesia 09/27/2020   Elevated troponin level not due myocardial infarction 11/11/2019   Essential hypertension    Gangrene of toe of left foot (HCC) 07/06/2019   Heart murmur    Phreesia 09/27/2020   Hyperlipidemia    Phreesia 09/27/2020   Hypertension    Phreesia 09/27/2020   Mixed hyperlipidemia    Morbid obesity (HCC)    S/P aortic valve replacement with mechanical valve 12/05/2019   25 mm Carbomedics top hat bileaflet mechanical valve via partial upper hemi-sternotomy   Severe aortic stenosis 09/24/2019   Type 2 diabetes mellitus (HCC)    Past Surgical History:  Procedure Laterality Date   ABDOMINAL AORTOGRAM W/LOWER EXTREMITY N/A 07/06/2019   Procedure: ABDOMINAL AORTOGRAM W/LOWER EXTREMITY;  Surgeon: Harvey Carlin BRAVO, MD;  Location: [facility] INVASIVE CV LAB;  Service: Cardiovascular;  Laterality: N/A;  Bilateral    AMPUTATION Left 07/09/2019   Procedure: LEFT FOURTH and Fifth TOE AMPUTATION.;  Surgeon: Oris Krystal FALCON, MD;  Location: [facility] OR;  Service: Vascular;  Laterality: Left;   AMPUTATION Left 03/11/2021   Procedure: LEFT FOOT 5TH  AND 4TH RAY AMPUTATION;  Surgeon: Harden Jerona GAILS, MD;  Location: [facility] OR;  Service: Orthopedics;  Laterality: Left;   AMPUTATION Left 08/28/2021   Procedure: AMPUTATION BELOW KNEE;  Surgeon: Harden Jerona GAILS, MD;  Location: [facility] OR;  Service: Orthopedics;  Laterality: Left;   AMPUTATION Left 01/29/2023   Procedure: LEFT AMPUTATION BELOW KNEE REVISION AND CLOSURE;  Surgeon: Harden Jerona GAILS, MD;  Location: [facility] OR;  Service: Orthopedics;  Laterality: Left;   AMPUTATION Left 07/27/2023   Procedure: LEFT ABOVE KNEE AMPUTATION;  Surgeon: Harden Jerona GAILS, MD;  Location: [facility] OR;  Service: Orthopedics;  Laterality: Left;   AORTIC VALVE REPLACEMENT N/A 12/05/2019   Procedure: PARTIAL STERNOTOMY FOR AORTIC VALVE REPLACEMENT (AVR), USING CARBOMEDICS SUPRA-ANNULAR TOP HAT ;  Surgeon: Dusty Sudie DEL, MD;  Location: [facility] OR;  Service: Open Heart Surgery;  Laterality: N/A;  No neck lines on left   APPLICATION OF WOUND VAC Left 10/30/2021   Procedure: APPLICATION OF WOUND VAC;  Surgeon: Harden Jerona GAILS, MD;  Location: [facility] OR;  Service: Orthopedics;  Laterality: Left;  APPLICATION OF WOUND VAC Left 12/25/2021   Procedure: APPLICATION OF WOUND VAC;  Surgeon: Harden Jerona GAILS, MD;  Location: [facility] OR;  Service: Orthopedics;  Laterality: Left;   APPLICATION OF WOUND VAC Left 01/26/2023   Procedure: APPLICATION OF WOUND VAC TO LEFT STUMP;  Surgeon: Jerri Kay HERO, MD;  Location: [facility] OR;  Service: Orthopedics;  Laterality: Left;   CARDIAC VALVE REPLACEMENT N/A    Phreesia 09/27/2020   I & D EXTREMITY Left 01/26/2023   Procedure: IRRIGATION AND DEBRIDEMENT OF LEFT BKA STUMP;  Surgeon: Jerri Kay HERO, MD;  Location: [facility] OR;  Service: Orthopedics;  Laterality: Left;   IR RADIOLOGY PERIPHERAL GUIDED IV START  10/05/2019   IR US   GUIDE VASC ACCESS RIGHT  10/05/2019   MULTIPLE EXTRACTIONS WITH ALVEOLOPLASTY N/A 10/26/2019   Procedure: EXTRACTION OF TOOTH #'S 3, 5-11,19-28,  AND 32 WITH ALVEOLOPLASTY;  Surgeon: Cyndee Tanda FALCON, DDS;  Location: [facility] OR;  Service: Oral Surgery;  Laterality: N/A;   RIGHT HEART CATH AND CORONARY ANGIOGRAPHY N/A 09/24/2019   Procedure: RIGHT HEART CATH AND CORONARY ANGIOGRAPHY;  Surgeon: Wonda Sharper, MD;  Location: [facility] INVASIVE CV LAB;  Service: Cardiovascular;  Laterality: N/A;   STUMP REVISION Left 10/30/2021   Procedure: REVISION LEFT BELOW KNEE AMPUTATION;  Surgeon: Harden Jerona GAILS, MD;  Location: [facility] OR;  Service: Orthopedics;  Laterality: Left;   STUMP REVISION Left 12/25/2021   Procedure: REVISION LEFT BELOW KNEE AMPUTATION;  Surgeon: Harden Jerona GAILS, MD;  Location: [facility] OR;  Service: Orthopedics;  Laterality: Left;   TEE WITHOUT CARDIOVERSION N/A 12/05/2019   Procedure: TRANSESOPHAGEAL ECHOCARDIOGRAM (TEE);  Surgeon: Dusty Sudie DEL, MD;  Location: [facility] OR;  Service: Open Heart Surgery;  Laterality: N/A;   TEE WITHOUT CARDIOVERSION N/A 09/01/2021   Procedure: TRANSESOPHAGEAL ECHOCARDIOGRAM (TEE);  Surgeon: Kate Lonni CROME, MD;  Location: [facility] ENDOSCOPY;  Service: Cardiovascular;  Laterality: N/A;   Patient Active Problem List   Diagnosis Date Noted   Vitamin B12 deficiency 02/15/2024   Iron deficiency anemia 12/22/2023   S/P AKA (above knee amputation), left (HCC) 07/31/2023   Hepatic steatosis 03/25/2023   GERD (gastroesophageal reflux disease) 01/24/2023   History of osteomyelitis 10/12/2022   Uncontrolled type 2 diabetes mellitus with hyperglycemia (HCC) 10/12/2022   CAD (coronary artery disease) 10/12/2022   Encounter for therapeutic drug monitoring 10/12/2022   Anxiety 09/30/2020   S/P aortic valve replacement with mechanical valve 12/05/2019   Hyperlipidemia associated with type 2 diabetes mellitus (HCC) 10/12/2015   Class 3 obesity (HCC) 10/12/2015   HTN (hypertension)  07/02/2013    PCP: Bluford Jacqulyn MATSU, DO  REFERRING PROVIDER: Valdemar Rocky SAUNDERS, NP  REFERRING DIAG: [phone] (ICD-10-CM) - S/P AKA (above knee amputation), left (HCC)  THERAPY DIAG:  S/P AKA (above knee amputation) unilateral, left (HCC)  Difficulty in walking, not elsewhere classified  Other abnormalities of gait and mobility  Unsteadiness on feet  Muscle weakness (generalized)  Rationale for Evaluation and Treatment: Rehabilitation  ONSET DATE: 07/27/23 s/p  SUBJECTIVE:   SUBJECTIVE STATEMENT: Pt reports eating 2 biscuits before therapy today. Pt states he received a letter stating he was denied additional therapy and then another letter stating he did obtain additional therapy, will bring it in next session. Pt states he does not know what he will do if he not able to keep coming to therapy, does not feel like he can do it on his own at this time.       S/p 07/27/23 left AKA.  Received  prosthesis last month. History of multiple surgeries on the left lower extremity.   Previously had a below knee amputation and managed well but this prosthesis is quite a bit more challenging.  Has been using a WC primarily for mobility and uses a walker to transfer (RW).  [employer] is prosthetist.  Arrives in [place] with family cousins who assist as needed.    PERTINENT HISTORY: Anemia; getting iron infusions Has artificial heart valve. PAIN:  Are you having pain? Yes: NPRS scale: 0 Pain location: phantom pain left leg; right knee sometimes Pain description: nerve pain, right knee aching Aggravating factors: right knee when being up on it Relieving factors: Gabapentin , Tylenol   PRECAUTIONS: None  RED FLAGS: None   WEIGHT BEARING RESTRICTIONS: No  FALLS:  Has patient fallen in last 6 months? Yes. Number of falls 1  LIVING ENVIRONMENT: Lives with: lives with their family Lives in: House/apartment Stairs: No ramp entry Has following equipment at home: Vannie - 2 wheeled, Wheelchair (power),  Wheelchair (manual), shower chair, bed side commode, and Ramped entry bed cane  OCCUPATION: not working; [employer] before  PLOF: Independent with household mobility with device  PATIENT GOALS: want to walk on 2 feet; being able to work some  NEXT MD VISIT: hematologist in August  OBJECTIVE:  Note: Objective measures were completed at Evaluation unless otherwise noted.  DIAGNOSTIC FINDINGS:   PATIENT SURVEYS:  LEFS 27/80 33.8% 06/22/24: 13 / 80 = 16.3 % Extreme difficulty/unable (0), Quite a bit of difficulty (1), Moderate difficulty (2), Little difficulty (3), No difficulty (4) Survey date:    Any of your usual work, housework or school activities   2. Usual hobbies, recreational or sporting activities   3. Getting into/out of the bath   4. Walking between rooms   5. Putting on socks/shoes   6. Squatting    7. Lifting an object, like a bag of groceries from the floor   8. Performing light activities around your home   9. Performing heavy activities around your home   10. Getting into/out of a car   11. Walking 2 blocks   12. Walking 1 mile   13. Going up/down 10 stairs (1 flight)   14. Standing for 1 hour   15.  sitting for 1 hour   16. Running on even ground   17. Running on uneven ground   18. Making sharp turns while running fast   19. Hopping    20. Rolling over in bed   Score total:  27/80     COGNITION: Overall cognitive status: Within functional limits for tasks assessed     SENSATION: Not tested  EDEMA:  None noted  POSTURE: rounded shoulders, forward head, flexed trunk , and weight shift right  PALPATION: No significant tenderness noted  LOWER EXTREMITY ROM:  Active ROM Right eval Left eval  Hip flexion    Hip extension    Hip abduction    Hip adduction    Hip internal rotation    Hip external rotation    Knee flexion    Knee extension    Ankle dorsiflexion    Ankle plantarflexion    Ankle inversion    Ankle eversion     (Blank  rows = not tested)  LOWER EXTREMITY MMT:  MMT Right eval Left eval  Hip flexion    Hip extension    Hip abduction    Hip adduction    Hip internal rotation    Hip external rotation  Knee flexion    Knee extension    Ankle dorsiflexion    Ankle plantarflexion    Ankle inversion    Ankle eversion     (Blank rows = not tested)   FUNCTIONAL TESTS:  30 seconds chair stand test x 1 with min A for safety to RW  GAIT: Distance walked: 0 ft Assistive device utilized: Environmental consultant - 2 wheeled Level of assistance: Min A Comments: patient unable to get left prosthesis to lock out to take a step                                                                                                                                TREATMENT DATE:  07/20/2024  Vitals: BP: pre: 170/101 mmHg Neuromuscular Re-education: -Step ups, 3 bouts, blue aeromat, mirror used for visual feed back an improved posture, standing balance with no UE support, 30 second trial x2, pt cued for deep breath, sequencing, LE placement and standing upright, min assist provided, palloff press, shoulder rows/extensions, each 1 set of 10 reps for 3 of the bouts Therapeutic Activity: -3 way kick, 1 sets of 5 reps, bilaterally pt cued for increased ROM, mirror used for visual feed back an improved posture, pt requires CGA for WB through prosthetic -Mat table to wheelchair transfer, x1, pt require WC on right side to complete stand pivot transfer from elevated mat table. Pt educated on the importance of reaching back for chair arm for safety with stand to sit transfer Gait Training: -Ambulation trial with personal RW with locked prosthetic, 1 bouts of 30 feet, pt requires min therapist assist with wheel chair follow for ambulation, pt cued for foot placement as pt demonstrates improved coordination and more appropriate step length with prosthesis.   07/10/2024  Vitals: BP: pre: 188/102 mmHg; post: 176/99 mmHg HR: pre: 72; post:  74 Neuromuscular Re-education: -Step ups, 1 bout, 6 inch step, mirror used for visual feed back an improved posture, 1 set of 6 reps, pt cued for deep breath, sequencing, LE placement and standing upright, min assist provided -Standing with no UE support, mirror used for visual feed back an improved posture, 2 bouts 20 seconds apiece, pt in parallel bars and SBA for safety -Ambulation trial with personal RW with locked prosthetic, 1 bouts of  about 18 feet, mirror used for visual feed back an improved posture, pt requires CGA therapist assist with wheel chair follow for ambulation, pt cued for foot placement as pt demonstrates improved coordination and more appropriate step length with prosthesis.  Therapeutic Activity: -3 way kick, 2 sets of 4 reps, 4lb ankle weight on prosthetic side, pt cued for increased ROM, mirror used for visual feed back an improved posture -Lateral stepping to parallel bars, 1 bout of 6 feet, pt still demonstrating hesitancy with SLS on LLE, pt cued for breathing and upright posture -Mat table to wheelchair transfer, x1, pt require WC on right side to complete stand pivot  transfer from elevated mat table. Pt educated on the importance of reaching back for chair arm for safety with stand to sit transfer -Weight shifts with prosthetic unlocked, 1 set of 7 reps, pt continues to demonstrate decreased ability to bear weight on prosthesis with it in unlocked position, pt cued for upright trunk and increased L hip flexion/extension.    07/04/2024  Vitals: BP: 157/90 mm/Hg HR: 81 bpm O2 sat: Neuromuscular Re-education: -Step up and over to lateral step up and over, 1 bout, 6 inch step and 4 inch step, pt cued for deep breath and standing upright, min assist provided -Standing with no UE support, 2 bouts 18 seconds apiece, pt in parallel bars and SBA for safety Therapeutic Activity: -Lateral stepping in parallel bars, 1 lap, pt gets more panic when further from [facility], pt cued for  breathing and upright posture -Mat table to wheelchair transfer, x1, pt require WC on right side to complete stand pivot transfer from elevated mat table. Pt educated on the importance of reaching back for chair arm for safety with stand to sit transfer -Weight shifts with prosthetic unlocked, pt continues to demonstrate decreased ability to bear weight on prosthesis with it in unlocked position, pt cued for upright trunk and increased L hip extension. Gait Training: -Ambulation trial with personal RW with locked prosthetic, 2 bouts of  about 30 feet, pt requires min therapist assist with wheel chair follow for ambulation, pt cued for foot placement as pt demonstrates improved coordination and more appropriate step length with prosthesis.    PATIENT EDUCATION:  Education details: Patient educated on exam findings, POC, scope of PT, HEP, and discussion regarding calling prosthetist for adjustments. Person educated: Patient Education method: Explanation, Demonstration, and Handouts Education comprehension: verbalized understanding, returned demonstration, verbal cues required, and tactile cues required   HOME EXERCISE PROGRAM: Access Code: [email or web address] URL: [email or web address] Date: 04/26/2024 Prepared by: [facility] - Rehab  Exercises - Standing Single Leg Stance with Counter Support  - 2 x daily - 7 x weekly - 1 sets - 2 reps - Lying Prone  - 2 x daily - 7 x weekly - 1 sets - 1 reps - 5 min hold - Sidelying Hip Abduction (AKA)  - 2 x daily - 7 x weekly - 2 sets - 10 reps - Prone Hip Extension with Residual Limb (BKA)  - 2 x daily - 7 x weekly - 2 sets - 10 reps   Access Code: [email or web address] URL: [email or web address] Date: 06/22/2024 Prepared by: Lang Ada  Exercises - Sidelying Hip Abduction  - 1 x daily - 7 x weekly - 3 sets - 10 reps - Supine Active Straight Leg Raise  - 1 x daily - 7 x weekly - 3 sets - 10 reps - Prone Hip Extension  - 1 x daily - 7 x weekly - 3  sets - 10 reps ASSESSMENT:  CLINICAL IMPRESSION: Patient continues to demonstrate decreased LE strength, decreased functional mobility gait quality and balance. Patient also demonstrates increased motivation during today's session, able to stand longer with less sitting breaks. Patient able to progress dynamic balance and core activation exercises today with decreased UE support with shoulder rows, extensions and Paloff presses with RTB, good performance with verbal cueing. Pt continues to demonstrate increased blood pressure reading for 3rd consecutive treatment, pt educated on danger of uncontrolled hypertension and to seek appointment with primary care immediately, hopeful pt will comply although has not followed up in the past with similar recommendations. Patient would continue  to benefit from skilled physical therapy for increased endurance with ambulation, increased LE strength, and improved balance for improved quality of life, improved independence with gait training and continued progress towards therapy goals.   Patient is a 48 y.o. male who was seen today for physical therapy evaluation and treatment for Z89.612 (ICD-10-CM) - S/P AKA (above knee amputation), left (HCC). Patient demonstrates decreased strength, balance deficits and gait abnormalities which are negatively impacting patient ability to perform ADLs and functional mobility tasks. Patient will benefit from skilled physical therapy services to address these deficits to improve level of function with ADLs, functional mobility tasks, and reduce risk for falls.    OBJECTIVE IMPAIRMENTS: Abnormal gait, decreased activity tolerance, decreased balance, difficulty walking, decreased strength, obesity, and pain.   ACTIVITY LIMITATIONS: carrying, lifting, standing, squatting, stairs, transfers, bed mobility, and locomotion level  PARTICIPATION LIMITATIONS: meal prep, cleaning, shopping, community activity, and yard work  PERSONAL  FACTORS: Time since onset of injury/illness/exacerbation and 1 comorbidity: anemia are also affecting patient's functional outcome.   REHAB POTENTIAL: Good  CLINICAL DECISION MAKING: Evolving/moderate complexity  EVALUATION COMPLEXITY: Moderate   GOALS: Goals reviewed with patient? No  SHORT TERM GOALS: Target date: 05/24/2024 patient will be independent with initial HEP  Baseline: Goal status: Progressing  2.  Patient will report 30% improvement overall  Baseline:  Goal status: Progressing  3.  Patient will be able to don and doff prosthesis independently Baseline: needs mod assist Goal status: Progressing   LONG TERM GOALS: Target date: 06/21/24  Patient will be independent in self management strategies to improve quality of life and functional outcomes.  Baseline:  Goal status: Partially Met  2.  Patient will report 50% improvement overall  Baseline:  Goal status: Progressing  3.  Patient will improve LEFS score by 23 points to demonstrate improved perceived function  Baseline: 27/80 Goal status: Progressing  4.  Patient will ambulate with LRAD and prosthesis x 200 ft in 2 minutes or less Baseline: unable Goal status: Progressing  PLAN:  PT FREQUENCY: 2x/week  PT DURATION: 6 weeks  PLANNED INTERVENTIONS: 97164- PT Re-evaluation, 97110-Therapeutic exercises, 97530- Therapeutic activity, 97112- Neuromuscular re-education, 97535- Self Care, [id number]- Manual therapy, [id number]- Gait training, [phone]- Orthotic Fit/training, [phone]- Canalith repositioning, [id number]- Aquatic Therapy, 97760- Splinting, [phone]- Wound care (first 20 sq cm), 97598- Wound care (each additional 20 sq cm)Patient/Family education, Balance training, Stair training, Taping, Dry Needling, Joint mobilization, Joint manipulation, Spinal manipulation, Spinal mobilization, Scar mobilization, and DME instructions.   PLAN FOR NEXT SESSION: prosthetic donning and doffing and locking/unlocking; gait training,  balance, transfers, bed mobility; left lower extremity strengthening, consider gravity minimized LE strengthening positions especially for left hip extension.   Lang Ada, PT, DPT [facility] Office: [phone] 5:53 PM, 07/20/24

## 2024-07-24 ENCOUNTER — Ambulatory Visit (HOSPITAL_COMMUNITY)

## 2024-07-30 ENCOUNTER — Encounter: Payer: Self-pay | Admitting: Radiology

## 2024-08-02 ENCOUNTER — Ambulatory Visit (HOSPITAL_COMMUNITY)

## 2024-08-07 ENCOUNTER — Ambulatory Visit: Attending: Cardiology

## 2024-08-08 ENCOUNTER — Telehealth (HOSPITAL_COMMUNITY): Payer: Self-pay

## 2024-08-08 NOTE — Telephone Encounter (Signed)
 PHYSICAL THERAPY DISCHARGE SUMMARY  Visits from Start of Care: 10  Current functional level related to goals / functional outcomes: See last treatment note, pt not returned since last visit.   Remaining deficits: See last treatment note, pt not returned since last visit.   Education / Equipment: See last treatment note, pt not returned since last visit.   Patient agrees to discharge. Patient goals were partially met. Patient is being discharged due to not returning since the last visit.  Returned pts call this afternoon to discuss POC. Pt states his car is currently out of commission and has not sought care for his blood pressure fluctuations and GI tract issues. Pt educated importance of going in for a primary care visit to address mentioned ongoing issues and obtain a new PT referral when internal health is stabilized and has transportation to therapy. Pt to be discharged this date from this episode of PT.   Lang Ada, PT, DPT [facility] Office: [phone] 2:08 PM, 08/08/24

## 2024-08-08 NOTE — Telephone Encounter (Signed)
 Pt called concerning POC as he did not show for his last appointment time on 08/02/24. Pt was left instructions to call back for discussion of plan of care, will wait to discharge from this episode of care until hearing from him.  Lang Ada, PT, DPT [facility] Office: [phone] 9:37 AM, 08/08/24

## 2024-08-08 NOTE — Therapy (Signed)
 PHYSICAL THERAPY DISCHARGE SUMMARY  Visits from Start of Care: 10  Current functional level related to goals / functional outcomes: See last treatment note, pt not returned since last visit.   Remaining deficits: See last treatment note, pt not returned since last visit.   Education / Equipment: See last treatment note, pt not returned since last visit.   Patient agrees to discharge. Patient goals were partially met. Patient is being discharged due to not returning since the last visit.  Returned pts call this afternoon to discuss POC. Pt states his car is currently out of commission and has not sought care for his blood pressure fluctuations and GI tract issues. Pt educated importance of going in for a primary care visit to address mentioned ongoing issues and obtain a new PT referral when internal health is stabilized and has transportation to therapy. Pt to be discharged this date from this episode of PT.   Lang Ada, PT, DPT [facility] Office: [phone] 2:08 PM, 08/08/24

## 2024-08-27 ENCOUNTER — Inpatient Hospital Stay

## 2024-08-28 ENCOUNTER — Inpatient Hospital Stay: Attending: Oncology

## 2024-08-28 DIAGNOSIS — G4700 Insomnia, unspecified: Secondary | ICD-10-CM | POA: Insufficient documentation

## 2024-08-28 DIAGNOSIS — D509 Iron deficiency anemia, unspecified: Secondary | ICD-10-CM | POA: Diagnosis present

## 2024-08-28 DIAGNOSIS — E538 Deficiency of other specified B group vitamins: Secondary | ICD-10-CM | POA: Diagnosis not present

## 2024-08-28 LAB — COMPREHENSIVE METABOLIC PANEL WITH GFR
ALT: 68 U/L — ABNORMAL HIGH (ref 0–44)
AST: 56 U/L — ABNORMAL HIGH (ref 15–41)
Albumin: 4.2 g/dL (ref 3.5–5.0)
Alkaline Phosphatase: 59 U/L (ref 38–126)
Anion gap: 15 (ref 5–15)
BUN: 11 mg/dL (ref 6–20)
CO2: 25 mmol/L (ref 22–32)
Calcium: 9.6 mg/dL (ref 8.9–10.3)
Chloride: 91 mmol/L — ABNORMAL LOW (ref 98–111)
Creatinine, Ser: 0.86 mg/dL (ref 0.61–1.24)
GFR, Estimated: >60 mL/min (ref >60)
Glucose, Bld: 386 mg/dL — ABNORMAL HIGH (ref 70–99)
Potassium: 4.8 mmol/L (ref 3.5–5.1)
Sodium: 131 mmol/L — ABNORMAL LOW (ref 135–145)
Total Bilirubin: 1.1 mg/dL (ref 0.0–1.2)
Total Protein: 7.3 g/dL (ref 6.5–8.1)

## 2024-08-28 LAB — CBC WITH DIFFERENTIAL/PLATELET
Abs Immature Granulocytes: 0.03 K/uL (ref 0.00–0.07)
Basophils Absolute: 0 K/uL (ref 0.0–0.1)
Basophils Relative: 1 %
Eosinophils Absolute: 0.1 K/uL (ref 0.0–0.5)
Eosinophils Relative: 1 %
HCT: 46.3 % (ref 39.0–52.0)
Hemoglobin: 15.3 g/dL (ref 13.0–17.0)
Immature Granulocytes: 1 %
Lymphocytes Relative: 20 %
Lymphs Abs: 1.3 K/uL (ref 0.7–4.0)
MCH: 29 pg (ref 26.0–34.0)
MCHC: 33 g/dL (ref 30.0–36.0)
MCV: 87.7 fL (ref 80.0–100.0)
Monocytes Absolute: 0.4 K/uL (ref 0.1–1.0)
Monocytes Relative: 6 %
Neutro Abs: 4.5 K/uL (ref 1.7–7.7)
Neutrophils Relative %: 71 %
Platelets: 148 K/uL — ABNORMAL LOW (ref 150–400)
RBC: 5.28 MIL/uL (ref 4.22–5.81)
RDW: 12.8 % (ref 11.5–15.5)
WBC: 6.2 K/uL (ref 4.0–10.5)
nRBC: 0 % (ref 0.0–0.2)

## 2024-08-28 LAB — IRON AND TIBC
Iron: 112 ug/dL (ref 45–182)
Saturation Ratios: 31 % (ref 17.9–39.5)
TIBC: 365 ug/dL (ref 250–450)
UIBC: 253 ug/dL

## 2024-08-28 LAB — FOLATE: Folate: 15 ng/mL (ref >5.9)

## 2024-08-28 LAB — VITAMIN B12: Vitamin B-12: 407 pg/mL (ref 180–914)

## 2024-08-28 LAB — FERRITIN: Ferritin: 195 ng/mL (ref 24–336)

## 2024-08-31 ENCOUNTER — Inpatient Hospital Stay: Admitting: Oncology

## 2024-08-31 DIAGNOSIS — D508 Other iron deficiency anemias: Secondary | ICD-10-CM | POA: Diagnosis not present

## 2024-08-31 DIAGNOSIS — D509 Iron deficiency anemia, unspecified: Secondary | ICD-10-CM | POA: Diagnosis not present

## 2024-08-31 DIAGNOSIS — E538 Deficiency of other specified B group vitamins: Secondary | ICD-10-CM

## 2024-08-31 NOTE — Assessment & Plan Note (Addendum)
 Patient has mild vitamin B12 deficiency with levels less than 400  - Continue oral vitamin B12 1000 mcg daily

## 2024-08-31 NOTE — Assessment & Plan Note (Addendum)
 Patient currently has iron deficiency anemia-unknown source.  Labs consistent with severe iron deficiency.   Patient is on metformin  and had previous borderline low vitamin B12 levels. Patient has received a total of 4 doses of IV iron most recently in June 2025.   Reports improvement of her symptoms and hemoglobin. Patient does not report any melena or hematochezia Patient was referred for colonoscopy but he must be cleared by his cardiologist first.  -No additional IV iron needed at this time. - Continue oral iron every other day - Continue to follow with GI for colonoscopy and endoscopy -Return to clinic in 3 months with labs a few days before.

## 2024-08-31 NOTE — Assessment & Plan Note (Signed)
 Patient currently has iron deficiency anemia-unknown source.  Labs consistent with severe iron deficiency.   Patient is on metformin  and had previous borderline low vitamin B12 levels. Patient has received a total of 4 doses of IV iron most recently in June 2025.   Reports improvement of her symptoms and hemoglobin. Patient does not report any melena or hematochezia Patient was referred for colonoscopy but he must be cleared by his cardiologist first.  -No additional IV iron needed at this time. - Continue oral iron every other day - Continue to follow with GI for colonoscopy and endoscopy -Return to clinic in 3 months with labs a few days before.

## 2024-08-31 NOTE — Progress Notes (Signed)
 [facility] OFFICE PROGRESS NOTE  Long, Adam G, DO  ASSESSMENT & PLAN:   I connected with Adam Long on 08/31/24 at  9:30 AM EST by telephone visit and verified that I am speaking with the correct person using two identifiers.   I discussed the limitations, risks, security and privacy concerns of performing an evaluation and management service by telemedicine and the availability of in-person appointments. I also discussed with the patient that there may be a patient responsible charge related to this service. The patient expressed understanding and agreed to proceed.   Other persons participating in the visit and their role in the encounter: NP, Patient    Patient's location: Home  Provider's location: Clinic    Assessment & Plan Other iron deficiency anemia Patient currently has iron deficiency anemia-unknown source.  Labs consistent with severe iron deficiency.   Patient is on metformin  and had previous borderline low vitamin B12 levels. Patient has received a total of 4 doses of IV iron most recently in June 2025.   Reports improvement of her symptoms and hemoglobin. Patient does not report any melena or hematochezia Patient was referred for colonoscopy but he must be cleared by his cardiologist first.  -No additional IV iron needed at this time. - Continue oral iron every other day - Continue to follow with GI for colonoscopy and endoscopy -Return to clinic in 3 months with labs a few days before. Vitamin B12 deficiency Patient has mild vitamin B12 deficiency with levels less than 400  - Continue oral vitamin B12 1000 mcg daily  Iron deficiency anemia, unspecified iron deficiency anemia type Patient currently has iron deficiency anemia-unknown source.  Labs consistent with severe iron deficiency.   Patient is on metformin  and had previous borderline low vitamin B12 levels. Patient has received a total of 4 doses of IV iron most recently in June 2025.    Reports improvement of her symptoms and hemoglobin. Patient does not report any melena or hematochezia Patient was referred for colonoscopy but he must be cleared by his cardiologist first.  -No additional IV iron needed at this time. - Continue oral iron every other day - Continue to follow with GI for colonoscopy and endoscopy -Return to clinic in 3 months with labs a few days before.  No orders of the defined types were placed in this encounter.   INTERVAL HISTORY: Patient returns for follow-up for iron deficiency and B12 deficiency.  He received 2 doses of Feraheme  on 04/05/2024 and 04/13/2024.  He tolerates infusions well.  He has not had a colonoscopy or EGD yet.  Reports he must get cleared by her cardiologist first.  Patient required an echocardiogram prior to colonoscopy which she had done on 05/17/2024 which showed left ventricular ejection fraction 60 to 65% with normal function.  Everything else was within normal limits.  Overall, patient is doing well.  Energy levels have improved.  Appetite is at 100%.  Denies any pain.  Patient has been working with physical therapy for his above-knee amputation of his left leg.  Reports he still unsteady on his feet but overall feeling better.  PT was abruptly stopped here recently due to recertification with his insurance company.  He finds this very frustrating.  Appetite is 80% energy levels are 75%.  He has numbness in his right foot and insomnia at times.  He is currently taking B12 supplements daily and iron every other day with vitamin C .  Reports occasional diarrhea and nausea  but very rare.  He has headaches and numbness in his right foot.  Reports he has fallen twice without injury.  We reviewed CBC, iron panel,  ferritin, B12 and folate level.  SUMMARY OF HEMATOLOGIC HISTORY: Oncology History   No history exists.    Iron deficiency anemia-unknown source - S/p IV Feraheme  5 mg X 2 doses on 12/29/2020 and 01/09/2024 - S/p IV  Feraheme  510 mg x 2 doses on 04/05/2024 and 04/13/2024.   CBC    Component Value Date/Time   WBC 6.2 08/28/2024 1244   RBC 5.28 08/28/2024 1244   HGB 15.3 08/28/2024 1244   HGB 13.1 04/27/2022 1033   HCT 46.3 08/28/2024 1244   HCT 41.3 04/27/2022 1033   PLT 148 (L) 08/28/2024 1244   PLT 257 04/27/2022 1033   MCV 87.7 08/28/2024 1244   MCV 79 04/27/2022 1033   MCH 29.0 08/28/2024 1244   MCHC 33.0 08/28/2024 1244   RDW 12.8 08/28/2024 1244   RDW 18.0 (H) 04/27/2022 1033   LYMPHSABS 1.3 08/28/2024 1244   LYMPHSABS 1.5 06/04/2021 0909   MONOABS 0.4 08/28/2024 1244   EOSABS 0.1 08/28/2024 1244   EOSABS 0.1 06/04/2021 0909   BASOSABS 0.0 08/28/2024 1244   BASOSABS 0.0 06/04/2021 0909       Latest Ref Rng & Units 08/28/2024   12:44 PM 05/22/2024    9:38 AM 01/31/2024   10:07 AM  CMP  Glucose 70 - 99 mg/dL 613  622  726   BUN 6 - 20 mg/dL 11  11  10    Creatinine 0.61 - 1.24 mg/dL 9.13  9.05  9.17   Sodium 135 - 145 mmol/L 131  132  132   Potassium 3.5 - 5.1 mmol/L 4.8  4.4  4.5   Chloride 98 - 111 mmol/L 91  93  94   CO2 22 - 32 mmol/L 25  25  24    Calcium  8.9 - 10.3 mg/dL 9.6  9.2  9.3   Total Protein 6.5 - 8.1 g/dL 7.3  7.5  7.3   Total Bilirubin 0.0 - 1.2 mg/dL 1.1  1.4  0.7   Alkaline Phos 38 - 126 U/L 59  54  38   AST 15 - 41 U/L 56  59  48   ALT 0 - 44 U/L 68  64  47      Lab Results  Component Value Date   FERRITIN 195 08/28/2024   VITAMINB12 407 08/28/2024    There were no vitals filed for this visit.   Review of System:  Review of Systems  Constitutional:  Positive for malaise/fatigue.  Gastrointestinal:  Positive for diarrhea and nausea.  Musculoskeletal:  Positive for falls.  Neurological:  Positive for dizziness.    Physical Exam: Physical Exam Neurological:     Mental Status: He is alert and oriented to person, place, and time.     I provided 25 minutes of non face-to-face telephone visit time during this encounter, and > 50% was spent  counseling as documented under my assessment & plan.   Adam Hope, NP 08/31/2024 9:37 AM

## 2024-09-11 ENCOUNTER — Ambulatory Visit: Admitting: Nurse Practitioner

## 2024-09-20 ENCOUNTER — Other Ambulatory Visit: Payer: Self-pay | Admitting: Family Medicine

## 2024-09-21 ENCOUNTER — Other Ambulatory Visit: Payer: Self-pay

## 2024-09-21 ENCOUNTER — Other Ambulatory Visit (HOSPITAL_BASED_OUTPATIENT_CLINIC_OR_DEPARTMENT_OTHER): Payer: Self-pay

## 2024-09-21 MED ORDER — WARFARIN SODIUM 10 MG PO TABS
10.0000 mg | ORAL_TABLET | Freq: Every day | ORAL | 0 refills | Status: AC
  Filled 2024-09-21: qty 90, 90d supply, fill #0

## 2024-09-24 ENCOUNTER — Encounter: Payer: Self-pay | Admitting: *Deleted

## 2024-09-25 ENCOUNTER — Ambulatory Visit: Attending: Cardiology | Admitting: *Deleted

## 2024-09-25 DIAGNOSIS — Z5181 Encounter for therapeutic drug level monitoring: Secondary | ICD-10-CM | POA: Insufficient documentation

## 2024-09-25 DIAGNOSIS — Z954 Presence of other heart-valve replacement: Secondary | ICD-10-CM | POA: Diagnosis present

## 2024-09-25 LAB — POCT INR: INR: 1.8 — AB (ref 2.0–3.0)

## 2024-09-25 NOTE — Patient Instructions (Signed)
 Take 1 1/2 tablets tonight then increase dose to 1 tablet daily expcet for 1/2 tablet on Mondays and Fridays.  Recheck in 3 wks.

## 2024-09-25 NOTE — Progress Notes (Signed)
 INR 1.8. Please see anticoagulation encounter

## 2024-10-16 ENCOUNTER — Ambulatory Visit

## 2024-10-23 ENCOUNTER — Ambulatory Visit: Admitting: Nurse Practitioner

## 2024-10-23 ENCOUNTER — Ambulatory Visit: Admitting: Family Medicine

## 2024-10-25 ENCOUNTER — Ambulatory Visit: Attending: Cardiology

## 2024-10-29 ENCOUNTER — Ambulatory Visit: Admitting: Family Medicine

## 2024-10-29 DIAGNOSIS — E1165 Type 2 diabetes mellitus with hyperglycemia: Secondary | ICD-10-CM

## 2024-10-29 DIAGNOSIS — F5101 Primary insomnia: Secondary | ICD-10-CM | POA: Diagnosis not present

## 2024-10-29 DIAGNOSIS — Z794 Long term (current) use of insulin: Secondary | ICD-10-CM | POA: Diagnosis not present

## 2024-10-29 DIAGNOSIS — E1169 Type 2 diabetes mellitus with other specified complication: Secondary | ICD-10-CM | POA: Diagnosis not present

## 2024-10-29 DIAGNOSIS — Z954 Presence of other heart-valve replacement: Secondary | ICD-10-CM

## 2024-10-29 DIAGNOSIS — G4700 Insomnia, unspecified: Secondary | ICD-10-CM | POA: Insufficient documentation

## 2024-10-29 DIAGNOSIS — Z89612 Acquired absence of left leg above knee: Secondary | ICD-10-CM

## 2024-10-29 DIAGNOSIS — E785 Hyperlipidemia, unspecified: Secondary | ICD-10-CM | POA: Diagnosis not present

## 2024-10-29 DIAGNOSIS — I10 Essential (primary) hypertension: Secondary | ICD-10-CM | POA: Diagnosis not present

## 2024-10-29 MED ORDER — TRAZODONE HCL 50 MG PO TABS: 50.0000 mg | ORAL_TABLET | Freq: Every evening | ORAL | 3 refills | Status: AC | PRN

## 2024-10-29 NOTE — Assessment & Plan Note (Signed)
 Referring to PT. Will send in new Rx for wheelchair.

## 2024-10-29 NOTE — Assessment & Plan Note (Signed)
"   Trial of Trazodone   "

## 2024-10-29 NOTE — Assessment & Plan Note (Signed)
 Advised to get INR rechecked.

## 2024-10-29 NOTE — Progress Notes (Signed)
 Virtual Visit via Video Note  I connected with Adam Long on 10/29/24 at 11:40 AM EST by a video enabled telemedicine application and verified that I am speaking with the correct person using two identifiers.  Location: Patient: Home Provider: Home   I discussed the limitations of evaluation and management by telemedicine and the availability of in person appointments. The patient expressed understanding and agreed to proceed.  History of Present Illness: 49 year old male with CAD, HTN, GERD, Hepatic steatosis, HLD, Uncontrolled DM-2 (follows with Endo), Morbid Obesity, Hx of amputation, s/p Aortic valve replacement, IDA, B12 deficiency presents for follow up.  Follows with endocrinology.  A1c continues to be uncontrolled.  He is on insulin .  Has upcoming follow-up this month.  Reports that he has had worsening anxiety and this is interfering with sleep.  He states that he sleeps about an hour and wakes up.  He would like to discuss this today.  He states that his blood pressure readings have been fairly well-controlled.  He is on metoprolol .  Patient states that he is wheelchair is giving out.  He states that the wheel bearings are wearing out.  He would like a new wheelchair.  He is status post above-the-knee amputation, left.  Has significant difficulty with mobility.  He would like a referral back to physical therapy as well.   Last INR was subtherapeutic.  He states that his dose was changed.  Needs follow-up INR.  He has not gotten this done due to inclement weather.  Observations/Objective: General: Well-appearing male in no acute distress. Respiratory: Speaking full sentences.  No increased work of breathing.  Assessment and Plan:  Hypertension, unspecified type Assessment & Plan: Stable per his report. Continue Metoprolol .   Hyperlipidemia associated with type 2 diabetes mellitus (HCC) Assessment & Plan: Fair control.  Continue Crestor  and Zetia .   S/P aortic  valve replacement with mechanical valve Overview: 25 mm Carbomedics top hat bileaflet mechanical valve via partial upper hemi-sternotomy  Assessment & Plan: Advised to get INR rechecked.   Uncontrolled type 2 diabetes mellitus with hyperglycemia (HCC) Assessment & Plan: Uncontrolled.  Continue follow-up with endocrinology.   Primary insomnia Assessment & Plan: Trial of Trazodone .  Orders: -     traZODone  HCl; Take 1 tablet (50 mg total) by mouth at bedtime as needed for sleep.  Dispense: 30 tablet; Refill: 3  S/P AKA (above knee amputation), left [facility]) Assessment & Plan: Referring to PT. Will send in new Rx for wheelchair.  Orders: -     Ambulatory referral to Physical Therapy     Follow Up Instructions:    I discussed the assessment and treatment plan with the patient. The patient was provided an opportunity to ask questions and all were answered. The patient agreed with the plan and demonstrated an understanding of the instructions.   The patient was advised to call back or seek an in-person evaluation if the symptoms worsen or if the condition fails to improve as anticipated.  I provided 10 minutes of non-face-to-face time during this encounter.   Itza Maniaci G Wataru Mccowen, DO

## 2024-10-29 NOTE — Assessment & Plan Note (Signed)
 Fair control.  Continue Crestor  and Zetia .

## 2024-11-09 ENCOUNTER — Ambulatory Visit: Admitting: Nurse Practitioner

## 2024-11-22 ENCOUNTER — Ambulatory Visit: Admitting: Nurse Practitioner

## 2024-11-27 ENCOUNTER — Ambulatory Visit (HOSPITAL_COMMUNITY)

## 2025-02-28 ENCOUNTER — Inpatient Hospital Stay

## 2025-03-07 ENCOUNTER — Inpatient Hospital Stay: Admitting: Oncology

## 2025-03-12 ENCOUNTER — Inpatient Hospital Stay: Admitting: Oncology
